# Patient Record
Sex: Female | Born: 1980 | Race: Black or African American | Hispanic: No | Marital: Married | Smoking: Never smoker
Health system: Southern US, Community
[De-identification: ages and names within clinical notes are randomized; demographics above are authoritative.]

## PROBLEM LIST (undated history)

## (undated) DIAGNOSIS — Z992 Dependence on renal dialysis: Secondary | ICD-10-CM

## (undated) DIAGNOSIS — G610 Guillain-Barre syndrome: Secondary | ICD-10-CM

## (undated) DIAGNOSIS — N186 End stage renal disease: Secondary | ICD-10-CM

## (undated) DIAGNOSIS — E119 Type 2 diabetes mellitus without complications: Secondary | ICD-10-CM

## (undated) DIAGNOSIS — N289 Disorder of kidney and ureter, unspecified: Secondary | ICD-10-CM

## (undated) DIAGNOSIS — I639 Cerebral infarction, unspecified: Secondary | ICD-10-CM

## (undated) DIAGNOSIS — F419 Anxiety disorder, unspecified: Secondary | ICD-10-CM

## (undated) DIAGNOSIS — D649 Anemia, unspecified: Secondary | ICD-10-CM

## (undated) HISTORY — PX: AMPUTATION TOE: SHX6595

## (undated) HISTORY — PX: AV FISTULA PLACEMENT: SHX1204

---

## 2006-08-13 DIAGNOSIS — H5440 Blindness, one eye, unspecified eye: Secondary | ICD-10-CM

## 2006-08-13 HISTORY — DX: Blindness, one eye, unspecified eye: H54.40

## 2010-03-13 DIAGNOSIS — Z765 Malingerer [conscious simulation]: Secondary | ICD-10-CM | POA: Insufficient documentation

## 2010-07-13 DIAGNOSIS — N049 Nephrotic syndrome with unspecified morphologic changes: Secondary | ICD-10-CM | POA: Insufficient documentation

## 2010-07-24 DIAGNOSIS — I120 Hypertensive chronic kidney disease with stage 5 chronic kidney disease or end stage renal disease: Secondary | ICD-10-CM | POA: Insufficient documentation

## 2011-07-30 DIAGNOSIS — T7491XA Unspecified adult maltreatment, confirmed, initial encounter: Secondary | ICD-10-CM | POA: Insufficient documentation

## 2014-08-11 DIAGNOSIS — R197 Diarrhea, unspecified: Secondary | ICD-10-CM | POA: Insufficient documentation

## 2014-08-11 DIAGNOSIS — Z713 Dietary counseling and surveillance: Secondary | ICD-10-CM | POA: Insufficient documentation

## 2014-08-11 DIAGNOSIS — R52 Pain, unspecified: Secondary | ICD-10-CM | POA: Insufficient documentation

## 2014-08-11 DIAGNOSIS — D689 Coagulation defect, unspecified: Secondary | ICD-10-CM | POA: Insufficient documentation

## 2014-08-11 DIAGNOSIS — L299 Pruritus, unspecified: Secondary | ICD-10-CM | POA: Insufficient documentation

## 2014-08-11 DIAGNOSIS — R0602 Shortness of breath: Secondary | ICD-10-CM | POA: Insufficient documentation

## 2014-08-11 DIAGNOSIS — D509 Iron deficiency anemia, unspecified: Secondary | ICD-10-CM | POA: Insufficient documentation

## 2014-08-11 DIAGNOSIS — E875 Hyperkalemia: Secondary | ICD-10-CM | POA: Insufficient documentation

## 2014-08-27 DIAGNOSIS — N186 End stage renal disease: Secondary | ICD-10-CM | POA: Insufficient documentation

## 2015-01-05 DIAGNOSIS — H269 Unspecified cataract: Secondary | ICD-10-CM | POA: Insufficient documentation

## 2015-01-05 DIAGNOSIS — E1010 Type 1 diabetes mellitus with ketoacidosis without coma: Secondary | ICD-10-CM | POA: Insufficient documentation

## 2015-01-05 DIAGNOSIS — Z89421 Acquired absence of other right toe(s): Secondary | ICD-10-CM | POA: Insufficient documentation

## 2015-04-12 DIAGNOSIS — Z2839 Other underimmunization status: Secondary | ICD-10-CM | POA: Insufficient documentation

## 2015-10-13 DIAGNOSIS — E46 Unspecified protein-calorie malnutrition: Secondary | ICD-10-CM | POA: Insufficient documentation

## 2016-08-23 DIAGNOSIS — R17 Unspecified jaundice: Secondary | ICD-10-CM | POA: Insufficient documentation

## 2017-04-26 DIAGNOSIS — Z4802 Encounter for removal of sutures: Secondary | ICD-10-CM | POA: Insufficient documentation

## 2017-07-24 ENCOUNTER — Other Ambulatory Visit: Payer: Self-pay

## 2017-07-24 ENCOUNTER — Encounter (HOSPITAL_COMMUNITY): Payer: Self-pay | Admitting: Emergency Medicine

## 2017-07-24 ENCOUNTER — Emergency Department (HOSPITAL_COMMUNITY)
Admission: EM | Admit: 2017-07-24 | Discharge: 2017-07-24 | Disposition: A | Discharge status: Home / Self Care | Payer: Medicaid Other | Attending: Emergency Medicine | Admitting: Emergency Medicine

## 2017-07-24 ENCOUNTER — Emergency Department (HOSPITAL_COMMUNITY): Payer: Medicaid Other

## 2017-07-24 DIAGNOSIS — N189 Chronic kidney disease, unspecified: Secondary | ICD-10-CM | POA: Diagnosis not present

## 2017-07-24 DIAGNOSIS — R0602 Shortness of breath: Secondary | ICD-10-CM | POA: Insufficient documentation

## 2017-07-24 DIAGNOSIS — R739 Hyperglycemia, unspecified: Secondary | ICD-10-CM | POA: Diagnosis not present

## 2017-07-24 DIAGNOSIS — E119 Type 2 diabetes mellitus without complications: Secondary | ICD-10-CM | POA: Insufficient documentation

## 2017-07-24 HISTORY — DX: Type 2 diabetes mellitus without complications: E11.9

## 2017-07-24 HISTORY — DX: Dependence on renal dialysis: Z99.2

## 2017-07-24 HISTORY — DX: Disorder of kidney and ureter, unspecified: N28.9

## 2017-07-24 LAB — COMPREHENSIVE METABOLIC PANEL
ALBUMIN: 3 g/dL — AB (ref 3.5–5.0)
ALK PHOS: 91 U/L (ref 38–126)
ALT: 10 U/L — ABNORMAL LOW (ref 14–54)
ANION GAP: 14 (ref 5–15)
AST: 12 U/L — ABNORMAL LOW (ref 15–41)
BUN: 72 mg/dL — ABNORMAL HIGH (ref 6–20)
CALCIUM: 8.7 mg/dL — AB (ref 8.9–10.3)
CO2: 18 mmol/L — AB (ref 22–32)
Chloride: 103 mmol/L (ref 101–111)
Creatinine, Ser: 12.04 mg/dL — ABNORMAL HIGH (ref 0.44–1.00)
GFR calc Af Amer: 4 mL/min — ABNORMAL LOW (ref >60)
GFR calc non Af Amer: 4 mL/min — ABNORMAL LOW (ref >60)
GLUCOSE: 321 mg/dL — AB (ref 65–99)
Potassium: 4.2 mmol/L (ref 3.5–5.1)
SODIUM: 135 mmol/L (ref 135–145)
Total Bilirubin: 0.6 mg/dL (ref 0.3–1.2)
Total Protein: 7.1 g/dL (ref 6.5–8.1)

## 2017-07-24 LAB — I-STAT CHEM 8, ED
BUN: 68 mg/dL — ABNORMAL HIGH (ref 6–20)
CALCIUM ION: 1.03 mmol/L — AB (ref 1.15–1.40)
Chloride: 105 mmol/L (ref 101–111)
Creatinine, Ser: 12.1 mg/dL — ABNORMAL HIGH (ref 0.44–1.00)
Glucose, Bld: 334 mg/dL — ABNORMAL HIGH (ref 65–99)
HCT: 38 % (ref 36.0–46.0)
HEMOGLOBIN: 12.9 g/dL (ref 12.0–15.0)
Potassium: 4.3 mmol/L (ref 3.5–5.1)
SODIUM: 138 mmol/L (ref 135–145)
TCO2: 21 mmol/L — AB (ref 22–32)

## 2017-07-24 LAB — CBC WITH DIFFERENTIAL/PLATELET
BASOS ABS: 0 10*3/uL (ref 0.0–0.1)
BASOS PCT: 0 %
EOS ABS: 0.5 10*3/uL (ref 0.0–0.7)
Eosinophils Relative: 7 %
HCT: 36.1 % (ref 36.0–46.0)
HEMOGLOBIN: 11.6 g/dL — AB (ref 12.0–15.0)
Lymphocytes Relative: 22 %
Lymphs Abs: 1.5 10*3/uL (ref 0.7–4.0)
MCH: 26.8 pg (ref 26.0–34.0)
MCHC: 32.1 g/dL (ref 30.0–36.0)
MCV: 83.4 fL (ref 78.0–100.0)
MONOS PCT: 4 %
Monocytes Absolute: 0.3 10*3/uL (ref 0.1–1.0)
NEUTROS PCT: 67 %
Neutro Abs: 4.5 10*3/uL (ref 1.7–7.7)
Platelets: 178 10*3/uL (ref 150–400)
RBC: 4.33 MIL/uL (ref 3.87–5.11)
RDW: 16.2 % — ABNORMAL HIGH (ref 11.5–15.5)
WBC: 6.8 10*3/uL (ref 4.0–10.5)

## 2017-07-24 NOTE — ED Provider Notes (Signed)
[place] EMERGENCY DEPARTMENT Provider Note   CSN: [id number] Arrival date & time: 07/24/17  6378     History   Chief Complaint Chief Complaint  Patient presents with  . Shortness of Breath  . Hyperglycemia    HPI [name] is a 36 y.o. female w PMHx T2DM, renal failure on HD TThSa, presenting to ED for dialysis.  Patient states she last went to dialysis on Thursday of last week, and was unable to get to dialysis since that time due to the snow.  States she was scheduled for dialysis yesterday and today, however did not have a ride.  Presents via EMS.  Patient also complains of shortness of breath since yesterday, worse with laying flat.  Denies increased lower extremity edema, chest pain, congestion, or any other complaints.  States her nephrologist is "Dr. Loletha Grayer" and goes to dialysis at [facility].   The history is provided by the patient.    Past Medical History:  Diagnosis Date  . Diabetes mellitus without complication ([place])   . Dialysis patient ([place])   . Renal disorder     There are no active problems to display for this patient.   History reviewed. No pertinent surgical history.  OB History    No data available       Home Medications    Prior to Admission medications   Not on File    Family History No family history on file.  Social History Social History   Tobacco Use  . Smoking status: Not on file  Substance Use Topics  . Alcohol use: Not on file  . Drug use: Not on file     Allergies   Patient has no allergy information on record.   Review of Systems Review of Systems  Constitutional: Negative for fever.  Respiratory: Positive for shortness of breath.   Cardiovascular: Negative for chest pain and leg swelling.  Gastrointestinal: Negative for abdominal pain.  All other systems reviewed and are negative.    Physical Exam Updated Vital Signs BP (!) 193/95   Pulse 92   Resp 19   Ht  5\' 1"  (1.549 m)   Wt 63.5 kg (140 lb)   LMP 07/16/2017   SpO2 96%   BMI 26.45 kg/m   Physical Exam  Constitutional: She is oriented to person, place, and time. She appears well-developed and well-nourished. No distress.  HENT:  Head: Normocephalic and atraumatic.  Mouth/Throat: Oropharynx is clear and moist.  Eyes: Conjunctivae are normal.  Neck: Normal range of motion. Neck supple.  Cardiovascular: Normal rate, regular rhythm and intact distal pulses.  Murmur heard. Pulmonary/Chest: Effort normal. No respiratory distress. She has no wheezes. She has no rhonchi. She has no rales.  Abdominal: Soft. Bowel sounds are normal. She exhibits no distension. There is no tenderness.  Musculoskeletal: Normal range of motion.       Right lower leg: She exhibits no tenderness and no edema.       Left lower leg: She exhibits no tenderness and no edema.  Neurological: She is alert and oriented to person, place, and time.  Skin: Skin is warm.  Psychiatric: She has a normal mood and affect. Her behavior is normal.  Nursing note and vitals reviewed.    ED Treatments / Results  Labs (all labs ordered are listed, but only abnormal results are displayed) Labs Reviewed  CBC WITH DIFFERENTIAL/PLATELET - Abnormal; Notable for the following components:      Result  Value   Hemoglobin 11.6 (*)    RDW 16.2 (*)    All other components within normal limits  COMPREHENSIVE METABOLIC PANEL - Abnormal; Notable for the following components:   CO2 18 (*)    Glucose, Bld 321 (*)    BUN 72 (*)    Creatinine, Ser 12.04 (*)    Calcium 8.7 (*)    Albumin 3.0 (*)    AST 12 (*)    ALT 10 (*)    GFR calc non Af Amer 4 (*)    GFR calc Af Amer 4 (*)    All other components within normal limits  I-STAT CHEM 8, ED - Abnormal; Notable for the following components:   BUN 68 (*)    Creatinine, Ser 12.10 (*)    Glucose, Bld 334 (*)    Calcium, Ion 1.03 (*)    TCO2 21 (*)    All other components within normal  limits  CBG MONITORING, ED    EKG  EKG Interpretation  Date/Time:  Wednesday July 24 2017 10:12:18 EST Ventricular Rate:  92 PR Interval:    QRS Duration: 80 QT Interval:  366 QTC Calculation: 453 R Axis:   36 Text Interpretation:  Sinus rhythm Probable left atrial enlargement Probable anteroseptal infarct, old No old tracing to compare No peaking of T waves Confirmed by Duffy Bruce [phone]) on 07/24/2017 11:00:36 AM       Radiology Dg Chest 2 View  Result Date: 07/24/2017 CLINICAL DATA:  Shortness of breath. EXAM: CHEST  2 VIEW COMPARISON:  None. FINDINGS: The heart size and mediastinal contours are within normal limits. Both lungs are clear. No pneumothorax or pleural effusion is noted. The visualized skeletal structures are unremarkable. IMPRESSION: No active cardiopulmonary disease. Electronically Signed   By: Marijo Conception, M.D.   On: 07/24/2017 10:53    Procedures Procedures (including critical care time)  Medications Ordered in ED Medications - No data to display   Initial Impression / Assessment and Plan / ED Course  I have reviewed the triage vital signs and the nursing notes.  Pertinent labs & imaging results that were available during my care of the patient were reviewed by me and considered in my medical decision making (see chart for details).  Clinical Course as of Jul 24 1252  Wed Jul 24, 2017  1128 Spoke with Dr. Marval Regal, will review patient's chart and see patient to evaluate for dispo.  [JR]  [place], midlevel with nephrology, recommending patient is safe for discharge to attend her dialysis appointment in the morning.  [JR]    Clinical Course User Index [JR] Jaylun Fleener, [place] N, PA-C    Presenting with shortness of breath since yesterday, and missed dialysis appointments x2.  Patient on hemodialysis Tuesday Thursday and Saturday, missed Saturday and Tuesdays appointment.  Patient also had it this morning, however reports she did  not have a ride, however is aware of [facility]. On exam, lungs CTAB, no inc work of breathing, no LE edema or evidence of fluid overload. K 4.2, CO2 18, BUN 72, Cr 12.04. CXR neg. Pt hypertensive. Discussed patient with Dr. Marval Regal, nephrology, who recommends they will see patient and determine if she is safe for discharge w outpatient HD in the morning as scheduled. [place] with nephrology recommending pt is safe for discharge.   Patient discussed with and seen by Dr. Ellender Hose.  Discussed results, findings, treatment and follow up. Patient advised of return precautions. Patient verbalized understanding and agreed with  plan.   Final Clinical Impressions(s) / ED Diagnoses   Final diagnoses:  Shortness of breath  Chronic renal failure, unspecified CKD stage    ED Discharge Orders    None       Allyna Pittsley, [place] N, PA-C 07/24/17 1253    Duffy Bruce, MD 07/24/17 2105

## 2017-07-24 NOTE — ED Triage Notes (Addendum)
Pt in from home via [facility] with c/o sob x 2 days. Pt goes T-Th-S, states her last session was Tuesday d/t snowy weather. Aox4, denies pain or n/v. BP 182/102, CBG 398, RA sats 99%, speaking full sentences

## 2017-07-24 NOTE — Discharge Instructions (Signed)
It is important that you attend your hemodialysis appointment tomorrow morning. You can call [facility] for transportation.  Return to the ER if your shortness of breath worsens before you get to dialysis, or for new or concerning symptoms.

## 2017-07-24 NOTE — ED Notes (Signed)
Patient transported to X-ray 

## 2017-09-14 ENCOUNTER — Emergency Department (HOSPITAL_COMMUNITY)
Admission: EM | Admit: 2017-09-14 | Discharge: 2017-09-14 | Disposition: A | Discharge status: Home / Self Care | Payer: Medicaid Other | Attending: Emergency Medicine | Admitting: Emergency Medicine

## 2017-09-14 ENCOUNTER — Other Ambulatory Visit: Payer: Self-pay

## 2017-09-14 ENCOUNTER — Encounter (HOSPITAL_COMMUNITY): Payer: Self-pay | Admitting: *Deleted

## 2017-09-14 DIAGNOSIS — R109 Unspecified abdominal pain: Secondary | ICD-10-CM | POA: Insufficient documentation

## 2017-09-14 DIAGNOSIS — E876 Hypokalemia: Secondary | ICD-10-CM | POA: Diagnosis not present

## 2017-09-14 DIAGNOSIS — E119 Type 2 diabetes mellitus without complications: Secondary | ICD-10-CM | POA: Insufficient documentation

## 2017-09-14 LAB — COMPREHENSIVE METABOLIC PANEL
ALBUMIN: 2 g/dL — AB (ref 3.5–5.0)
ALT: 7 U/L — AB (ref 14–54)
AST: 11 U/L — AB (ref 15–41)
Alkaline Phosphatase: 60 U/L (ref 38–126)
Anion gap: 12 (ref 5–15)
BILIRUBIN TOTAL: 0.2 mg/dL — AB (ref 0.3–1.2)
BUN: 6 mg/dL (ref 6–20)
CHLORIDE: 98 mmol/L — AB (ref 101–111)
CO2: 27 mmol/L (ref 22–32)
CREATININE: 3.37 mg/dL — AB (ref 0.44–1.00)
Calcium: 8.8 mg/dL — ABNORMAL LOW (ref 8.9–10.3)
GFR calc Af Amer: 19 mL/min — ABNORMAL LOW (ref >60)
GFR, EST NON AFRICAN AMERICAN: 17 mL/min — AB (ref >60)
GLUCOSE: 177 mg/dL — AB (ref 65–99)
Potassium: 2.9 mmol/L — ABNORMAL LOW (ref 3.5–5.1)
Sodium: 137 mmol/L (ref 135–145)
Total Protein: 8.2 g/dL — ABNORMAL HIGH (ref 6.5–8.1)

## 2017-09-14 LAB — I-STAT BETA HCG BLOOD, ED (MC, WL, AP ONLY): I-stat hCG, quantitative: <5 m[IU]/mL (ref <5)

## 2017-09-14 LAB — CBC
HEMATOCRIT: 25 % — AB (ref 36.0–46.0)
Hemoglobin: 7.6 g/dL — ABNORMAL LOW (ref 12.0–15.0)
MCH: 26 pg (ref 26.0–34.0)
MCHC: 30.4 g/dL (ref 30.0–36.0)
MCV: 85.6 fL (ref 78.0–100.0)
PLATELETS: 313 10*3/uL (ref 150–400)
RBC: 2.92 MIL/uL — ABNORMAL LOW (ref 3.87–5.11)
RDW: 15.2 % (ref 11.5–15.5)
WBC: 9.8 10*3/uL (ref 4.0–10.5)

## 2017-09-14 LAB — LIPASE, BLOOD: LIPASE: 25 U/L (ref 11–51)

## 2017-09-14 MED ORDER — DICYCLOMINE HCL 20 MG PO TABS: 20.0000 mg | ORAL_TABLET | Freq: Two times a day (BID) | ORAL | 0 refills | Status: DC

## 2017-09-14 MED ORDER — DICYCLOMINE HCL 10 MG PO CAPS
10.0000 mg | ORAL_CAPSULE | Freq: Once | ORAL | Status: AC
  Administered 2017-09-14: 10 mg via ORAL
  Filled 2017-09-14: qty 1

## 2017-09-14 NOTE — ED Provider Notes (Signed)
[place] EMERGENCY DEPARTMENT Provider Note   CSN: [id number] Arrival date & time: 09/14/17  1846     History   Chief Complaint No chief complaint on file.   HPI [name] is a 37 y.o. female.  37 year old female with history of ESRD on HD presents with complaint of abdominal cramping.  Patient reports that routinely after finishing a dialysis session she developed abdominal cramps and mild diarrhea.  This is her complaint today.  She finished her dialysis session earlier today and experienced mild abdominal cramps and some diarrhea.  She denies fever.  She denies nausea or vomiting.  She reports that her symptoms are significantly improved since arriving at the ED.  She declines IV.    The history is provided by the patient.  Abdominal Cramping  This is a chronic problem. The current episode started more than 1 week ago. The problem occurs every several days. The problem has not changed since onset.Associated symptoms include abdominal pain. Pertinent negatives include no chest pain. Exacerbated by: dialysis. Nothing relieves the symptoms. She has tried nothing for the symptoms. The treatment provided no relief.    Past Medical History:  Diagnosis Date  . Diabetes mellitus without complication ([place])   . Dialysis patient ([place])   . Renal disorder     There are no active problems to display for this patient.   History reviewed. No pertinent surgical history.  OB History    No data available       Home Medications    Prior to Admission medications   Medication Sig Start Date End Date Taking? Authorizing Provider  dicyclomine (BENTYL) 20 MG tablet Take 1 tablet (20 mg total) by mouth 2 (two) times daily. 09/14/17   Valarie Merino, MD    Family History No family history on file.  Social History Social History   Tobacco Use  . Smoking status: Never Smoker  . Smokeless tobacco: Never Used  Substance Use Topics  . Alcohol use: No   Frequency: Never  . Drug use: No     Allergies   Patient has no known allergies.   Review of Systems Review of Systems  Cardiovascular: Negative for chest pain.  Gastrointestinal: Positive for abdominal pain.  All other systems reviewed and are negative.    Physical Exam Updated Vital Signs BP (!) 146/74   Pulse 85   Temp 99.1 F (37.3 C) (Oral)   Resp 17   Ht 5\' 1"  (1.549 m)   Wt 62.6 kg (138 lb)   SpO2 100%   BMI 26.07 kg/m   Physical Exam  Constitutional: She is oriented to person, place, and time. She appears well-developed and well-nourished. No distress.  HENT:  Head: Normocephalic and atraumatic.  Mouth/Throat: Oropharynx is clear and moist.  Eyes: Conjunctivae and EOM are normal. Pupils are equal, round, and reactive to light.  Neck: Normal range of motion. Neck supple.  Cardiovascular: Normal rate, regular rhythm and normal heart sounds.  Pulmonary/Chest: Effort normal and breath sounds normal. No respiratory distress.  Abdominal: Soft. She exhibits no distension. There is no tenderness.  Musculoskeletal: Normal range of motion. She exhibits no edema or deformity.  Neurological: She is alert and oriented to person, place, and time.  Skin: Skin is warm and dry.  Psychiatric: She has a normal mood and affect.  Nursing note and vitals reviewed.    ED Treatments / Results  Labs (all labs ordered are listed, but only abnormal results are displayed) Labs  Reviewed  COMPREHENSIVE METABOLIC PANEL - Abnormal; Notable for the following components:      Result Value   Potassium 2.9 (*)    Chloride 98 (*)    Glucose, Bld 177 (*)    Creatinine, Ser 3.37 (*)    Calcium 8.8 (*)    Total Protein 8.2 (*)    Albumin 2.0 (*)    AST 11 (*)    ALT 7 (*)    Total Bilirubin 0.2 (*)    GFR calc non Af Amer 17 (*)    GFR calc Af Amer 19 (*)    All other components within normal limits  CBC - Abnormal; Notable for the following components:   RBC 2.92 (*)     Hemoglobin 7.6 (*)    HCT 25.0 (*)    All other components within normal limits  LIPASE, BLOOD  URINALYSIS, ROUTINE W REFLEX MICROSCOPIC  I-STAT BETA HCG BLOOD, ED ([facility], [facility], [facility] ONLY)    EKG  EKG Interpretation  Date/Time:  Saturday September 14 2017 19:23:15 EST Ventricular Rate:  84 PR Interval:  134 QRS Duration: 80 QT Interval:  408 QTC Calculation: 482 R Axis:   41 Text Interpretation:  Normal sinus rhythm Nonspecific ST and T wave abnormality Prolonged QT Abnormal ECG Confirmed by Dene Gentry [id number]) on 09/14/2017 8:30:17 PM       Radiology No results found.  Procedures Procedures (including critical care time)  Medications Ordered in ED Medications  dicyclomine (BENTYL) capsule 10 mg (10 mg Oral Given 09/14/17 2052)     Initial Impression / Assessment and Plan / ED Course  I have reviewed the triage vital signs and the nursing notes.  Pertinent labs & imaging results that were available during my care of the patient were reviewed by me and considered in my medical decision making (see chart for details).     MDM  Screen Complete  Patient is presenting with diffuse abdominal cramping and some mild diarrhea following dialysis session.  This appears to be a chronic problem for her.  She denies any acuity to her complaint.  Baseline screening labs reveal mild hypokalemia.  She reports a long-standing history of hyperkalemia in the past.  She is not due for dialysis for another 72 hours and I will therefore not treat her mild hypokalemia at this time.  She feels improved following her ED evaluation and treatment.  Strict return precautions given and understood.  Close follow-up is advised.   Final Clinical Impressions(s) / ED Diagnoses   Final diagnoses:  Abdominal cramping  Hypokalemia    ED Discharge Orders        Ordered    dicyclomine (BENTYL) 20 MG tablet  2 times daily     09/14/17 2242       Valarie Merino, MD 09/14/17 2326

## 2017-09-14 NOTE — ED Notes (Signed)
Pt will advise staff when able to go to restroom.

## 2017-09-14 NOTE — ED Notes (Signed)
Pt unable to void at this time. 

## 2017-09-14 NOTE — ED Triage Notes (Signed)
Pt in from home via [facility], per report pt had full HD tx today, pt called out d/t epigastric pain, pt A&O x4, pt reported to have diarrhea x 1 that is typical for post HD per pt, denies n/v, pt c/o dizziness

## 2017-10-01 DIAGNOSIS — Z992 Dependence on renal dialysis: Secondary | ICD-10-CM | POA: Insufficient documentation

## 2018-05-28 ENCOUNTER — Encounter (HOSPITAL_COMMUNITY): Payer: Self-pay | Admitting: *Deleted

## 2018-05-28 ENCOUNTER — Emergency Department (HOSPITAL_COMMUNITY)
Admission: EM | Admit: 2018-05-28 | Discharge: 2018-05-28 | Disposition: A | Discharge status: Home / Self Care | Payer: Medicaid Other | Attending: Emergency Medicine | Admitting: Emergency Medicine

## 2018-05-28 ENCOUNTER — Other Ambulatory Visit: Payer: Self-pay

## 2018-05-28 DIAGNOSIS — N938 Other specified abnormal uterine and vaginal bleeding: Secondary | ICD-10-CM

## 2018-05-28 DIAGNOSIS — D649 Anemia, unspecified: Secondary | ICD-10-CM

## 2018-05-28 DIAGNOSIS — E119 Type 2 diabetes mellitus without complications: Secondary | ICD-10-CM | POA: Insufficient documentation

## 2018-05-28 LAB — I-STAT BETA HCG BLOOD, ED (MC, WL, AP ONLY)

## 2018-05-28 LAB — COMPREHENSIVE METABOLIC PANEL
ALT: 10 U/L (ref 0–44)
AST: 13 U/L — AB (ref 15–41)
Albumin: 3.1 g/dL — ABNORMAL LOW (ref 3.5–5.0)
Alkaline Phosphatase: 60 U/L (ref 38–126)
Anion gap: 10 (ref 5–15)
BUN: 36 mg/dL — ABNORMAL HIGH (ref 6–20)
CHLORIDE: 99 mmol/L (ref 98–111)
CO2: 28 mmol/L (ref 22–32)
Calcium: 8.9 mg/dL (ref 8.9–10.3)
Creatinine, Ser: 8.46 mg/dL — ABNORMAL HIGH (ref 0.44–1.00)
GFR calc Af Amer: 6 mL/min — ABNORMAL LOW (ref >60)
GFR, EST NON AFRICAN AMERICAN: 5 mL/min — AB (ref >60)
Glucose, Bld: 225 mg/dL — ABNORMAL HIGH (ref 70–99)
POTASSIUM: 4.2 mmol/L (ref 3.5–5.1)
SODIUM: 137 mmol/L (ref 135–145)
Total Bilirubin: 0.6 mg/dL (ref 0.3–1.2)
Total Protein: 6.6 g/dL (ref 6.5–8.1)

## 2018-05-28 LAB — CBC WITH DIFFERENTIAL/PLATELET
Abs Immature Granulocytes: 0.01 10*3/uL (ref 0.00–0.07)
BASOS PCT: 1 %
Basophils Absolute: 0 10*3/uL (ref 0.0–0.1)
EOS ABS: 0.3 10*3/uL (ref 0.0–0.5)
Eosinophils Relative: 7 %
HCT: 26.7 % — ABNORMAL LOW (ref 36.0–46.0)
Hemoglobin: 8.2 g/dL — ABNORMAL LOW (ref 12.0–15.0)
IMMATURE GRANULOCYTES: 0 %
Lymphocytes Relative: 37 %
Lymphs Abs: 1.6 10*3/uL (ref 0.7–4.0)
MCH: 27.8 pg (ref 26.0–34.0)
MCHC: 30.7 g/dL (ref 30.0–36.0)
MCV: 90.5 fL (ref 80.0–100.0)
MONOS PCT: 6 %
Monocytes Absolute: 0.3 10*3/uL (ref 0.1–1.0)
NEUTROS PCT: 49 %
NRBC: 0 % (ref 0.0–0.2)
Neutro Abs: 2.1 10*3/uL (ref 1.7–7.7)
PLATELETS: 126 10*3/uL — AB (ref 150–400)
RBC: 2.95 MIL/uL — AB (ref 3.87–5.11)
RDW: 12.3 % (ref 11.5–15.5)
WBC: 4.3 10*3/uL (ref 4.0–10.5)

## 2018-05-28 NOTE — ED Triage Notes (Signed)
Pt in c/o abnormal vaginal bleeding, states she had her normal menstrual cycle at the beginning of the month which ended, then restarted bleeding this Monday, denies pain, no distress noted

## 2018-05-28 NOTE — ED Notes (Signed)
Patient states she was on her menstrual period 10/1-10/5 then started vaginal bleeding 10/15 used 2pads per day and states she is passing clots.

## 2018-05-28 NOTE — ED Provider Notes (Signed)
[place] EMERGENCY DEPARTMENT Provider Note   CSN: [id number] Arrival date & time: 05/28/18  6578     History   Chief Complaint Chief Complaint  Patient presents with  . Vaginal Bleeding    HPI [name] is a 37 y.o. female.  HPI Patient states she normally has her.  The first of the month.  Normally last roughly 1 week.  Last normal menstrual cycle ended 10/5.  She began having vaginal bleeding over the last 2 days.  States she is using 2 pads a day.  Is having minimal lower abdominal cramping.  Denies vaginal discharge. Past Medical History:  Diagnosis Date  . Diabetes mellitus without complication ([place])   . Dialysis patient ([place])   . Renal disorder     There are no active problems to display for this patient.   History reviewed. No pertinent surgical history.   OB History   None      Home Medications    Prior to Admission medications   Medication Sig Start Date End Date Taking? Authorizing Provider  dicyclomine (BENTYL) 20 MG tablet Take 1 tablet (20 mg total) by mouth 2 (two) times daily. Patient not taking: Reported on 05/28/2018 09/14/17   Valarie Merino, MD    Family History History reviewed. No pertinent family history.  Social History Social History   Tobacco Use  . Smoking status: Never Smoker  . Smokeless tobacco: Never Used  Substance Use Topics  . Alcohol use: No    Frequency: Never  . Drug use: No     Allergies   Chocolate   Review of Systems Review of Systems  Constitutional: Negative for chills and fever.  Respiratory: Negative for cough and shortness of breath.   Cardiovascular: Negative for chest pain.  Gastrointestinal: Positive for abdominal pain. Negative for constipation, diarrhea and nausea.  Genitourinary: Positive for pelvic pain and vaginal bleeding. Negative for flank pain, frequency and vaginal discharge.  Musculoskeletal: Negative for back pain, myalgias and neck pain.  Skin: Negative for  rash and wound.  Neurological: Negative for dizziness, weakness, light-headedness, numbness and headaches.  All other systems reviewed and are negative.    Physical Exam Updated Vital Signs BP (!) 148/85   Pulse 92   Temp 98.4 F (36.9 C) (Oral)   Resp 20   LMP 05/13/2018   SpO2 100%   Physical Exam  Constitutional: She is oriented to person, place, and time. She appears well-developed and well-nourished.  HENT:  Head: Normocephalic and atraumatic.  Mouth/Throat: Oropharynx is clear and moist. No oropharyngeal exudate.  Eyes: Pupils are equal, round, and reactive to light. EOM are normal.  Neck: Normal range of motion. Neck supple.  Cardiovascular: Normal rate and regular rhythm.  Pulmonary/Chest: Effort normal and breath sounds normal.  Abdominal: Soft. Bowel sounds are normal. There is tenderness. There is no rebound and no guarding.  Very mild suprapubic tenderness with palpation.  No rebound or guarding.  Musculoskeletal: Normal range of motion. She exhibits no edema or tenderness.  No CVA tenderness bilaterally.  Neurological: She is alert and oriented to person, place, and time.  Skin: Skin is warm and dry. No rash noted. No erythema.  Psychiatric: She has a normal mood and affect. Her behavior is normal.  Nursing note and vitals reviewed.    ED Treatments / Results  Labs (all labs ordered are listed, but only abnormal results are displayed) Labs Reviewed  CBC WITH DIFFERENTIAL/PLATELET - Abnormal; Notable for the following components:  Result Value   RBC 2.95 (*)    Hemoglobin 8.2 (*)    HCT 26.7 (*)    Platelets 126 (*)    All other components within normal limits  COMPREHENSIVE METABOLIC PANEL - Abnormal; Notable for the following components:   Glucose, Bld 225 (*)    BUN 36 (*)    Creatinine, Ser 8.46 (*)    Albumin 3.1 (*)    AST 13 (*)    GFR calc non Af Amer 5 (*)    GFR calc Af Amer 6 (*)    All other components within normal limits  I-STAT  BETA HCG BLOOD, ED ([facility], [facility], [facility] ONLY)    EKG None  Radiology No results found.  Procedures Procedures (including critical care time)  Medications Ordered in ED Medications - No data to display   Initial Impression / Assessment and Plan / ED Course  I have reviewed the triage vital signs and the nursing notes.  Pertinent labs & imaging results that were available during my care of the patient were reviewed by me and considered in my medical decision making (see chart for details).     Patient is well-appearing and in no distress.  Complains of irregular vaginal bleeding. Hemoglobin is stable compared to earlier in the year.  Vital signs remained stable.  Patient advised to follow-up closely with OB/GYN.  Strict return precautions given. Final Clinical Impressions(s) / ED Diagnoses   Final diagnoses:  Dysfunctional uterine bleeding  Anemia, unspecified type    ED Discharge Orders    None       Julianne Rice, MD 05/28/18 1234

## 2018-06-09 ENCOUNTER — Ambulatory Visit: Payer: Medicaid Other | Admitting: Podiatry

## 2018-06-27 ENCOUNTER — Ambulatory Visit: Payer: Medicaid Other | Admitting: Podiatry

## 2018-07-05 DIAGNOSIS — E8770 Fluid overload, unspecified: Secondary | ICD-10-CM | POA: Insufficient documentation

## 2019-02-04 ENCOUNTER — Encounter: Payer: Medicaid Other | Admitting: Family

## 2019-02-10 ENCOUNTER — Telehealth (HOSPITAL_COMMUNITY): Payer: Self-pay | Admitting: Rehabilitation

## 2019-02-10 NOTE — Telephone Encounter (Signed)

## 2019-02-11 ENCOUNTER — Encounter: Payer: Self-pay | Admitting: *Deleted

## 2019-02-11 ENCOUNTER — Other Ambulatory Visit: Payer: Self-pay

## 2019-02-11 ENCOUNTER — Encounter: Payer: Self-pay | Admitting: Vascular Surgery

## 2019-02-11 ENCOUNTER — Ambulatory Visit (INDEPENDENT_AMBULATORY_CARE_PROVIDER_SITE_OTHER): Payer: Medicaid Other | Admitting: Vascular Surgery

## 2019-02-11 VITALS — BP 129/79 | HR 85 | Resp 14 | Ht 61.0 in | Wt 144.0 lb

## 2019-02-11 DIAGNOSIS — Z992 Dependence on renal dialysis: Secondary | ICD-10-CM | POA: Diagnosis not present

## 2019-02-11 DIAGNOSIS — D649 Anemia, unspecified: Secondary | ICD-10-CM | POA: Insufficient documentation

## 2019-02-11 DIAGNOSIS — H3500 Unspecified background retinopathy: Secondary | ICD-10-CM | POA: Insufficient documentation

## 2019-02-11 DIAGNOSIS — E785 Hyperlipidemia, unspecified: Secondary | ICD-10-CM | POA: Insufficient documentation

## 2019-02-11 DIAGNOSIS — N186 End stage renal disease: Secondary | ICD-10-CM

## 2019-02-11 DIAGNOSIS — F4310 Post-traumatic stress disorder, unspecified: Secondary | ICD-10-CM | POA: Insufficient documentation

## 2019-02-11 DIAGNOSIS — D638 Anemia in other chronic diseases classified elsewhere: Secondary | ICD-10-CM | POA: Insufficient documentation

## 2019-02-11 DIAGNOSIS — E119 Type 2 diabetes mellitus without complications: Secondary | ICD-10-CM | POA: Insufficient documentation

## 2019-02-11 DIAGNOSIS — I739 Peripheral vascular disease, unspecified: Secondary | ICD-10-CM | POA: Insufficient documentation

## 2019-02-11 DIAGNOSIS — N2581 Secondary hyperparathyroidism of renal origin: Secondary | ICD-10-CM | POA: Insufficient documentation

## 2019-02-11 NOTE — Progress Notes (Signed)
Patient name: [name] MRN: [id number] DOB: January 14, 1981 Sex: female  REASON FOR VISIT:   Enlarging aneurysms.  The consult is requested by Dr. Marval Regal.  HPI:   [name] is a pleasant 38 y.o. 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  Of note she is a poor historian is not sure how long she has had the fistula or when she began dialysis.  She dialyzes on Tuesdays Thursdays and Saturdays.  Importantly, she tells me that when she is on dialysis she gets some paresthesias in her right hand.  This goes away when she discontinues dialysis.  She denies any other uremic symptoms.  Specifically, she denies nausea, vomiting, fatigue, anorexia, or palpitations.  Past Medical History:  Diagnosis Date  . Diabetes mellitus without complication ([place])   . Dialysis patient ([place])   . Renal disorder     Family History  Problem Relation Age of Onset  . Diabetes Mother   . Diabetes Father     SOCIAL HISTORY: Social History   Tobacco Use  . Smoking status: Never Smoker  . Smokeless tobacco: Never Used  Substance Use Topics  . Alcohol use: No    Frequency: Never    Allergies  Allergen Reactions  . Chocolate Hives  . Morphine Rash    Current Outpatient Medications  Medication Sig Dispense Refill  . calcitRIOL (ROCALTROL) 0.25 MCG capsule Take by mouth.    . calcium acetate (PHOSLO) 667 MG capsule Take by mouth.    . calcium acetate (PHOSLO) 667 MG capsule TAKE 2 CAPSULES BY MOUTH THREE TIMES DAILY WITH MEALS    . dicyclomine (BENTYL) 20 MG tablet Take 1 tablet (20 mg total) by mouth 2 (two) times daily. 20 tablet 0  . FOSRENOL 1000 MG PACK MIX 1 PACKET WITH SMALL AMOUNT OF APPLESAUCE OR SIMILAR FOOD. EAT IMMEDIATELY 3 TIMES/DAY WITH MEALS & 1 PACKET WITH SNACKS TWICE A DAY    . insulin aspart (NOVOLOG) 100 UNIT/ML injection Inject into the skin.    Marland Kitchen insulin glargine (LANTUS) 100 UNIT/ML injection Inject into the skin.    . multivitamin  (RENA-VIT) TABS tablet TAKE 1 TABLET BY MOUTH EVERY MORNING   THIS IS YOUR RENAL VITAMIN     No current facility-administered medications for this visit.     REVIEW OF SYSTEMS:  [X]  denotes positive finding, [ ]  denotes negative finding Cardiac  Comments:  Chest pain or chest pressure:    Shortness of breath upon exertion:    Short of breath when lying flat:    Irregular heart rhythm:        Vascular    Pain in calf, thigh, or hip brought on by ambulation:    Pain in feet at night that wakes you up from your sleep:     Blood clot in your veins:    Leg swelling:         Pulmonary    Oxygen at home:    Productive cough:     Wheezing:         Neurologic    Sudden weakness in arms or legs:  x   Sudden numbness in arms or legs:     Sudden onset of difficulty speaking or slurred speech:    Temporary loss of vision in one eye:  x   Problems with dizziness:         Gastrointestinal    Blood in stool:     Vomited blood:  Genitourinary    Burning when urinating:     Blood in urine:        Psychiatric    Major depression:         Hematologic    Bleeding problems:    Problems with blood clotting too easily:        Skin    Rashes or ulcers:        Constitutional    Fever or chills:     PHYSICAL EXAM:   Vitals:   02/11/19 0813  BP: 129/79  Pulse: 85  Resp: 14  SpO2: 99%  Weight: 144 lb (65.3 kg)  Height: 5\' 1"  (1.549 m)    GENERAL: The patient is a well-nourished female, in no acute distress. The vital signs are documented above. CARDIAC: There is a regular rate and rhythm.  VASCULAR: She has a palpable thrill in her right upper arm brachiocephalic fistula. I cannot palpate a radial pulse. She has a monophasic but brisk radial signal with the Doppler.  I cannot obtain an ulnar signal. 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    PULMONARY: There is good air exchange bilaterally without  wheezing or rales. MUSCULOSKELETAL: There are no major deformities or cyanosis. NEUROLOGIC: No focal weakness or paresthesias are detected. SKIN: There are no ulcers or rashes noted. PSYCHIATRIC: The patient has a normal affect.  DATA:    No data  MEDICAL ISSUES:   ANEURYSM RIGHT BRACHIOCEPHALIC AV FISTULA: Given the aneurysm of the right brachiocephalic fistula I have recommended that we plicate this.  I have explained that I think if we do this they will still have room to get 2 needles either central to this or one above and below the revision.  In addition she does have some steal symptoms when she is on dialysis and by narrowing this area some I think that could potentially help with her symptoms.  This would essentially function as banding also.  She dialyzes on Tuesdays Thursdays and Saturdays and we have arranged this for Monday, July 20.  I have reviewed the procedure and potential complications and she is agreeable to proceed.  Deitra Mayo [facility] [phone]

## 2019-02-20 ENCOUNTER — Other Ambulatory Visit: Payer: Self-pay

## 2019-02-21 ENCOUNTER — Other Ambulatory Visit: Payer: Self-pay

## 2019-02-21 ENCOUNTER — Emergency Department (HOSPITAL_COMMUNITY): Payer: Medicaid Other

## 2019-02-21 ENCOUNTER — Observation Stay (HOSPITAL_COMMUNITY)
Admission: EM | Admit: 2019-02-21 | Discharge: 2019-02-23 | Disposition: A | Discharge status: Home / Self Care | Payer: Medicaid Other | Attending: Internal Medicine | Admitting: Internal Medicine

## 2019-02-21 ENCOUNTER — Encounter (HOSPITAL_COMMUNITY): Payer: Self-pay

## 2019-02-21 DIAGNOSIS — I120 Hypertensive chronic kidney disease with stage 5 chronic kidney disease or end stage renal disease: Secondary | ICD-10-CM | POA: Diagnosis not present

## 2019-02-21 DIAGNOSIS — Z1159 Encounter for screening for other viral diseases: Secondary | ICD-10-CM | POA: Insufficient documentation

## 2019-02-21 DIAGNOSIS — Z89421 Acquired absence of other right toe(s): Secondary | ICD-10-CM

## 2019-02-21 DIAGNOSIS — Z91018 Allergy to other foods: Secondary | ICD-10-CM

## 2019-02-21 DIAGNOSIS — F4310 Post-traumatic stress disorder, unspecified: Secondary | ICD-10-CM | POA: Diagnosis not present

## 2019-02-21 DIAGNOSIS — R739 Hyperglycemia, unspecified: Secondary | ICD-10-CM

## 2019-02-21 DIAGNOSIS — Z885 Allergy status to narcotic agent status: Secondary | ICD-10-CM | POA: Insufficient documentation

## 2019-02-21 DIAGNOSIS — R509 Fever, unspecified: Secondary | ICD-10-CM | POA: Diagnosis not present

## 2019-02-21 DIAGNOSIS — E785 Hyperlipidemia, unspecified: Secondary | ICD-10-CM | POA: Diagnosis not present

## 2019-02-21 DIAGNOSIS — D631 Anemia in chronic kidney disease: Secondary | ICD-10-CM | POA: Insufficient documentation

## 2019-02-21 DIAGNOSIS — E1165 Type 2 diabetes mellitus with hyperglycemia: Secondary | ICD-10-CM | POA: Insufficient documentation

## 2019-02-21 DIAGNOSIS — E11319 Type 2 diabetes mellitus with unspecified diabetic retinopathy without macular edema: Secondary | ICD-10-CM | POA: Insufficient documentation

## 2019-02-21 DIAGNOSIS — N186 End stage renal disease: Secondary | ICD-10-CM | POA: Diagnosis not present

## 2019-02-21 DIAGNOSIS — N2581 Secondary hyperparathyroidism of renal origin: Secondary | ICD-10-CM | POA: Insufficient documentation

## 2019-02-21 DIAGNOSIS — Z833 Family history of diabetes mellitus: Secondary | ICD-10-CM | POA: Diagnosis not present

## 2019-02-21 DIAGNOSIS — Z992 Dependence on renal dialysis: Secondary | ICD-10-CM | POA: Diagnosis not present

## 2019-02-21 DIAGNOSIS — E1122 Type 2 diabetes mellitus with diabetic chronic kidney disease: Secondary | ICD-10-CM | POA: Insufficient documentation

## 2019-02-21 DIAGNOSIS — E1151 Type 2 diabetes mellitus with diabetic peripheral angiopathy without gangrene: Secondary | ICD-10-CM | POA: Insufficient documentation

## 2019-02-21 DIAGNOSIS — Z91010 Allergy to peanuts: Secondary | ICD-10-CM

## 2019-02-21 DIAGNOSIS — Z79899 Other long term (current) drug therapy: Secondary | ICD-10-CM | POA: Insufficient documentation

## 2019-02-21 DIAGNOSIS — M791 Myalgia, unspecified site: Secondary | ICD-10-CM | POA: Insufficient documentation

## 2019-02-21 DIAGNOSIS — I272 Pulmonary hypertension, unspecified: Secondary | ICD-10-CM

## 2019-02-21 DIAGNOSIS — Z794 Long term (current) use of insulin: Secondary | ICD-10-CM | POA: Insufficient documentation

## 2019-02-21 DIAGNOSIS — Z20822 Contact with and (suspected) exposure to covid-19: Secondary | ICD-10-CM | POA: Diagnosis present

## 2019-02-21 LAB — COMPREHENSIVE METABOLIC PANEL
ALT: 14 U/L (ref 0–44)
AST: 17 U/L (ref 15–41)
Albumin: 2.8 g/dL — ABNORMAL LOW (ref 3.5–5.0)
Alkaline Phosphatase: 113 U/L (ref 38–126)
Anion gap: 18 — ABNORMAL HIGH (ref 5–15)
BUN: 54 mg/dL — ABNORMAL HIGH (ref 6–20)
CO2: 23 mmol/L (ref 22–32)
Calcium: 8.9 mg/dL (ref 8.9–10.3)
Chloride: 87 mmol/L — ABNORMAL LOW (ref 98–111)
Creatinine, Ser: 11.5 mg/dL — ABNORMAL HIGH (ref 0.44–1.00)
GFR calc Af Amer: 4 mL/min — ABNORMAL LOW (ref >60)
GFR calc non Af Amer: 4 mL/min — ABNORMAL LOW (ref >60)
Glucose, Bld: 675 mg/dL (ref 70–99)
Potassium: 4.6 mmol/L (ref 3.5–5.1)
Sodium: 128 mmol/L — ABNORMAL LOW (ref 135–145)
Total Bilirubin: 0.6 mg/dL (ref 0.3–1.2)
Total Protein: 6.9 g/dL (ref 6.5–8.1)

## 2019-02-21 LAB — BASIC METABOLIC PANEL
Anion gap: 16 — ABNORMAL HIGH (ref 5–15)
BUN: 56 mg/dL — ABNORMAL HIGH (ref 6–20)
CO2: 25 mmol/L (ref 22–32)
Calcium: 9.6 mg/dL (ref 8.9–10.3)
Chloride: 94 mmol/L — ABNORMAL LOW (ref 98–111)
Creatinine, Ser: 11.8 mg/dL — ABNORMAL HIGH (ref 0.44–1.00)
GFR calc Af Amer: 4 mL/min — ABNORMAL LOW (ref >60)
GFR calc non Af Amer: 4 mL/min — ABNORMAL LOW (ref >60)
Glucose, Bld: 109 mg/dL — ABNORMAL HIGH (ref 70–99)
Potassium: 4.2 mmol/L (ref 3.5–5.1)
Sodium: 135 mmol/L (ref 135–145)

## 2019-02-21 LAB — CBC WITH DIFFERENTIAL/PLATELET
Abs Immature Granulocytes: 0.1 10*3/uL — ABNORMAL HIGH (ref 0.00–0.07)
Basophils Absolute: 0 10*3/uL (ref 0.0–0.1)
Basophils Relative: 0 %
Eosinophils Absolute: 0.2 10*3/uL (ref 0.0–0.5)
Eosinophils Relative: 1 %
HCT: 34.3 % — ABNORMAL LOW (ref 36.0–46.0)
Hemoglobin: 11 g/dL — ABNORMAL LOW (ref 12.0–15.0)
Immature Granulocytes: 1 %
Lymphocytes Relative: 9 %
Lymphs Abs: 1.2 10*3/uL (ref 0.7–4.0)
MCH: 28.6 pg (ref 26.0–34.0)
MCHC: 32.1 g/dL (ref 30.0–36.0)
MCV: 89.3 fL (ref 80.0–100.0)
Monocytes Absolute: 1.1 10*3/uL — ABNORMAL HIGH (ref 0.1–1.0)
Monocytes Relative: 9 %
Neutro Abs: 10.7 10*3/uL — ABNORMAL HIGH (ref 1.7–7.7)
Neutrophils Relative %: 80 %
Platelets: 152 10*3/uL (ref 150–400)
RBC: 3.84 MIL/uL — ABNORMAL LOW (ref 3.87–5.11)
RDW: 13.1 % (ref 11.5–15.5)
WBC: 13.3 10*3/uL — ABNORMAL HIGH (ref 4.0–10.5)
nRBC: 0 % (ref 0.0–0.2)

## 2019-02-21 LAB — CBG MONITORING, ED
Glucose-Capillary: >600 mg/dL (ref 70–99)
Glucose-Capillary: 559 mg/dL (ref 70–99)
Glucose-Capillary: 431 mg/dL — ABNORMAL HIGH (ref 70–99)
Glucose-Capillary: 266 mg/dL — ABNORMAL HIGH (ref 70–99)
Glucose-Capillary: 83 mg/dL (ref 70–99)
Glucose-Capillary: 88 mg/dL (ref 70–99)
Glucose-Capillary: 87 mg/dL (ref 70–99)
Glucose-Capillary: 122 mg/dL — ABNORMAL HIGH (ref 70–99)

## 2019-02-21 LAB — RESPIRATORY PANEL BY PCR

## 2019-02-21 LAB — GLUCOSE, CAPILLARY
Glucose-Capillary: 154 mg/dL — ABNORMAL HIGH (ref 70–99)
Glucose-Capillary: 163 mg/dL — ABNORMAL HIGH (ref 70–99)

## 2019-02-21 LAB — SARS CORONAVIRUS 2 BY RT PCR (HOSPITAL ORDER, PERFORMED IN ~~LOC~~ HOSPITAL LAB)
SARS Coronavirus 2: NEGATIVE
SARS Coronavirus 2: NEGATIVE

## 2019-02-21 LAB — CBC
HCT: 37.2 % (ref 36.0–46.0)
Hemoglobin: 12 g/dL (ref 12.0–15.0)
MCH: 28.3 pg (ref 26.0–34.0)
MCHC: 32.3 g/dL (ref 30.0–36.0)
MCV: 87.7 fL (ref 80.0–100.0)
Platelets: 184 10*3/uL (ref 150–400)
RBC: 4.24 MIL/uL (ref 3.87–5.11)
RDW: 13 % (ref 11.5–15.5)
WBC: 16.3 10*3/uL — ABNORMAL HIGH (ref 4.0–10.5)
nRBC: 0 % (ref 0.0–0.2)

## 2019-02-21 LAB — URINALYSIS, ROUTINE W REFLEX MICROSCOPIC
Bacteria, UA: NONE SEEN
Bilirubin Urine: NEGATIVE
Glucose, UA: >=500 mg/dL — AB
Ketones, ur: NEGATIVE mg/dL
Nitrite: NEGATIVE
Protein, ur: 100 mg/dL — AB
RBC / HPF: >50 RBC/hpf — ABNORMAL HIGH (ref 0–5)
Specific Gravity, Urine: 1.012 (ref 1.005–1.030)
WBC, UA: >50 WBC/hpf — ABNORMAL HIGH (ref 0–5)
pH: 7 (ref 5.0–8.0)

## 2019-02-21 LAB — D-DIMER, QUANTITATIVE: D-Dimer, Quant: 1.29 ug/mL-FEU — ABNORMAL HIGH (ref 0.00–0.50)

## 2019-02-21 LAB — C-REACTIVE PROTEIN: CRP: 18.8 mg/dL — ABNORMAL HIGH (ref <1.0)

## 2019-02-21 LAB — LACTIC ACID, PLASMA: Lactic Acid, Venous: 1.8 mmol/L (ref 0.5–1.9)

## 2019-02-21 LAB — LACTATE DEHYDROGENASE: LDH: 240 U/L — ABNORMAL HIGH (ref 98–192)

## 2019-02-21 LAB — I-STAT BETA HCG BLOOD, ED (MC, WL, AP ONLY): I-stat hCG, quantitative: 16.8 m[IU]/mL — ABNORMAL HIGH (ref <5)

## 2019-02-21 LAB — OSMOLALITY: Osmolality: 296 mOsm/kg — ABNORMAL HIGH (ref 275–295)

## 2019-02-21 LAB — FERRITIN: Ferritin: 1203 ng/mL — ABNORMAL HIGH (ref 11–307)

## 2019-02-21 LAB — FIBRINOGEN: Fibrinogen: 656 mg/dL — ABNORMAL HIGH (ref 210–475)

## 2019-02-21 LAB — TRIGLYCERIDES: Triglycerides: 134 mg/dL (ref <150)

## 2019-02-21 LAB — MRSA PCR SCREENING: MRSA by PCR: NEGATIVE

## 2019-02-21 LAB — BETA-HYDROXYBUTYRIC ACID: Beta-Hydroxybutyric Acid: 0.17 mmol/L (ref 0.05–0.27)

## 2019-02-21 LAB — PROCALCITONIN: Procalcitonin: 3.05 ng/mL

## 2019-02-21 MED ORDER — SODIUM CHLORIDE 0.9% FLUSH
3.0000 mL | Freq: Two times a day (BID) | INTRAVENOUS | Status: DC
  Administered 2019-02-22 – 2019-02-23 (×3): 3 mL via INTRAVENOUS

## 2019-02-21 MED ORDER — INSULIN ASPART 100 UNIT/ML ~~LOC~~ SOLN
0.0000 [IU] | Freq: Three times a day (TID) | SUBCUTANEOUS | Status: DC
  Administered 2019-02-21 – 2019-02-22 (×3): 2 [IU] via SUBCUTANEOUS
  Administered 2019-02-23: 5 [IU] via SUBCUTANEOUS
  Administered 2019-02-23: 3 [IU] via SUBCUTANEOUS

## 2019-02-21 MED ORDER — INSULIN GLARGINE 100 UNIT/ML ~~LOC~~ SOLN
30.0000 [IU] | SUBCUTANEOUS | Status: AC
  Administered 2019-02-21: 30 [IU] via SUBCUTANEOUS
  Filled 2019-02-21: qty 0.3

## 2019-02-21 MED ORDER — RENA-VITE PO TABS
1.0000 | ORAL_TABLET | Freq: Every day | ORAL | Status: DC
  Administered 2019-02-21 – 2019-02-23 (×3): 1 via ORAL
  Filled 2019-02-21 (×3): qty 1

## 2019-02-21 MED ORDER — DEXTROSE 5 % IV SOLN
INTRAVENOUS | Status: DC
  Administered 2019-02-21: 16:00:00 via INTRAVENOUS

## 2019-02-21 MED ORDER — CHLORHEXIDINE GLUCONATE CLOTH 2 % EX PADS
6.0000 | MEDICATED_PAD | Freq: Every day | CUTANEOUS | Status: DC
  Administered 2019-02-22 – 2019-02-23 (×2): 6 via TOPICAL

## 2019-02-21 MED ORDER — SODIUM CHLORIDE 0.9 % FOR CRRT: INTRAVENOUS_CENTRAL | Status: DC | PRN

## 2019-02-21 MED ORDER — INSULIN REGULAR(HUMAN) IN NACL 100-0.9 UT/100ML-% IV SOLN
INTRAVENOUS | Status: DC
  Administered 2019-02-21: 09:00:00 5.4 [IU]/h via INTRAVENOUS
  Filled 2019-02-21: qty 100

## 2019-02-21 MED ORDER — HEPARIN SODIUM (PORCINE) 5000 UNIT/ML IJ SOLN
5000.0000 [IU] | Freq: Three times a day (TID) | INTRAMUSCULAR | Status: DC
  Administered 2019-02-21 – 2019-02-23 (×6): 5000 [IU] via SUBCUTANEOUS
  Filled 2019-02-21 (×6): qty 1

## 2019-02-21 MED ORDER — ALTEPLASE 2 MG IJ SOLR: 2.0000 mg | Freq: Once | INTRAMUSCULAR | Status: DC | PRN

## 2019-02-21 MED ORDER — PRISMASOL BGK 4/2.5 32-4-2.5 MEQ/L IV SOLN: INTRAVENOUS | Status: DC

## 2019-02-21 MED ORDER — CALCITRIOL 0.25 MCG PO CAPS
0.2500 ug | ORAL_CAPSULE | Freq: Every day | ORAL | Status: DC
  Administered 2019-02-21 – 2019-02-23 (×3): 0.25 ug via ORAL
  Filled 2019-02-21 (×3): qty 1

## 2019-02-21 MED ORDER — HEPARIN SODIUM (PORCINE) 1000 UNIT/ML DIALYSIS: 1000.0000 [IU] | INTRAMUSCULAR | Status: DC | PRN

## 2019-02-21 MED ORDER — DEXTROSE-NACL 5-0.45 % IV SOLN
INTRAVENOUS | Status: DC
  Administered 2019-02-21: 14:00:00 via INTRAVENOUS

## 2019-02-21 MED ORDER — LANTHANUM CARBONATE 500 MG PO CHEW
1000.0000 mg | CHEWABLE_TABLET | Freq: Three times a day (TID) | ORAL | Status: DC
  Administered 2019-02-21 – 2019-02-23 (×6): 1000 mg via ORAL
  Filled 2019-02-21 (×8): qty 2

## 2019-02-21 MED ORDER — PRISMASOL BGK 4/2.5 32-4-2.5 MEQ/L REPLACEMENT SOLN: Status: DC

## 2019-02-21 NOTE — ED Notes (Signed)
Pt CBG was 83, notified Mallory(RN)

## 2019-02-21 NOTE — ED Notes (Signed)
ED TO INPATIENT HANDOFF REPORT  ED Nurse Name and Phone #: Sherrine Maples [phone]  S Name/Age/Gender [name] 38 y.o. female Room/Bed: 018C/018C  Code Status   Code Status: Full Code  Home/SNF/Other Home Patient oriented to: self, place, time and situation Is this baseline? Yes   Triage Complete: Triage complete  Chief Complaint hyperglycemic/fever  Triage Note [facility]- pt coming from dialysis with c/o fever. Had not taken any thing for the fever PTA. EMS administered 1000mg  of tylenol. Per pt no other sx of coronavirus reported. Pt was unable to get dialysis today.  T: 102 146/76 HR 98 RR 16 CBG 567   Allergies Allergies  Allergen Reactions  . Peanut-Containing Drug Products Anaphylaxis and Hives  . Chocolate Hives  . Morphine Rash    Level of Care/Admitting Diagnosis ED Disposition    ED Disposition Condition [facility] Area: [place] [100100]  Level of Care: Progressive [102]  Covid Evaluation: Person Under Investigation (PUI)  Diagnosis: Hyperglycemia due to type 2 diabetes mellitus [facility]) [[id number]]  Admitting Physician: Oda Kilts [[id number]]  Attending Physician: Oda Kilts [[id number]]  PT Class (Do Not Modify): Observation [104]  PT Acc Code (Do Not Modify): Observation [10022]       B Medical/Surgery History Past Medical History:  Diagnosis Date  . Diabetes mellitus without complication ([place])   . Dialysis patient ([place])   . Renal disorder    Past Surgical History:  Procedure Laterality Date  . AMPUTATION TOE Left   . AV FISTULA PLACEMENT       A IV Location/Drains/Wounds Patient Lines/Drains/Airways Status   Active Line/Drains/Airways    Name:   Placement date:   Placement time:   Site:   Days:   Peripheral IV 02/21/19 Left Forearm   02/21/19    0821    Forearm   less than 1   Peripheral IV 02/21/19 Left Hand   02/21/19    1151    Hand   less than 1          Intake/Output Last 24  hours No intake or output data in the 24 hours ending 02/21/19 1553  Labs/Imaging Results for orders placed or performed during the hospital encounter of 02/21/19 (from the past 48 hour(s))  CBG monitoring, ED     Status: Abnormal   Collection Time: 02/21/19  7:19 AM  Result Value Ref Range   Glucose-Capillary >600 (HH) 70 - 99 mg/dL   Comment 1 Notify RN    Comment 2 Document in Chart   Comprehensive metabolic panel     Status: Abnormal   Collection Time: 02/21/19  8:29 AM  Result Value Ref Range   Sodium 128 (L) 135 - 145 mmol/L   Potassium 4.6 3.5 - 5.1 mmol/L   Chloride 87 (L) 98 - 111 mmol/L   CO2 23 22 - 32 mmol/L   Glucose, Bld 675 (HH) 70 - 99 mg/dL    Comment: CRITICAL RESULT CALLED TO, READ BACK BY AND VERIFIED WITH: B.WELCH,RN @ 0953 02/21/2019 [id number]    BUN 54 (H) 6 - 20 mg/dL   Creatinine, Ser 11.50 (H) 0.44 - 1.00 mg/dL   Calcium 8.9 8.9 - 10.3 mg/dL   Total Protein 6.9 6.5 - 8.1 g/dL   Albumin 2.8 (L) 3.5 - 5.0 g/dL   AST 17 15 - 41 U/L   ALT 14 0 - 44 U/L   Alkaline Phosphatase 113 38 - 126 U/L   Total Bilirubin  0.6 0.3 - 1.2 mg/dL   GFR calc non Af Amer 4 (L) >60 mL/min   GFR calc Af Amer 4 (L) >60 mL/min   Anion gap 18 (H) 5 - 15    Comment: Performed at [facility] [address] [place], [place] [address]  CBC with Differential     Status: Abnormal   Collection Time: 02/21/19  8:29 AM  Result Value Ref Range   WBC 13.3 (H) 4.0 - 10.5 K/uL   RBC 3.84 (L) 3.87 - 5.11 MIL/uL   Hemoglobin 11.0 (L) 12.0 - 15.0 g/dL   HCT 34.3 (L) 36.0 - 46.0 %   MCV 89.3 80.0 - 100.0 fL   MCH 28.6 26.0 - 34.0 pg   MCHC 32.1 30.0 - 36.0 g/dL   RDW 13.1 11.5 - 15.5 %   Platelets 152 150 - 400 K/uL   nRBC 0.0 0.0 - 0.2 %   Neutrophils Relative % 80 %   Neutro Abs 10.7 (H) 1.7 - 7.7 K/uL   Lymphocytes Relative 9 %   Lymphs Abs 1.2 0.7 - 4.0 K/uL   Monocytes Relative 9 %   Monocytes Absolute 1.1 (H) 0.1 - 1.0 K/uL   Eosinophils Relative 1 %   Eosinophils  Absolute 0.2 0.0 - 0.5 K/uL   Basophils Relative 0 %   Basophils Absolute 0.0 0.0 - 0.1 K/uL   Immature Granulocytes 1 %   Abs Immature Granulocytes 0.10 (H) 0.00 - 0.07 K/uL    Comment: Performed at [facility] [address], [place], [place] [address]  Lactic acid, plasma     Status: None   Collection Time: 02/21/19  8:29 AM  Result Value Ref Range   Lactic Acid, Venous 1.8 0.5 - 1.9 mmol/L    Comment: Performed at [facility] [address], [place], [place] [address]  SARS Coronavirus 2 (CEPHEID- Performed in [facility] hospital lab), Hosp Order     Status: None   Collection Time: 02/21/19  8:40 AM   Specimen: Nasopharyngeal Swab  Result Value Ref Range   SARS Coronavirus 2 NEGATIVE NEGATIVE    Comment: (NOTE) If result is NEGATIVE SARS-CoV-2 target nucleic acids are NOT DETECTED. The SARS-CoV-2 RNA is generally detectable in upper and lower  respiratory specimens during the acute phase of infection. The lowest  concentration of SARS-CoV-2 viral copies this assay can detect is 250  copies / mL. A negative result does not preclude SARS-CoV-2 infection  and should not be used as the sole basis for treatment or other  patient management decisions.  A negative result may occur with  improper specimen collection / handling, submission of specimen other  than nasopharyngeal swab, presence of viral mutation(s) within the  areas targeted by this assay, and inadequate number of viral copies  (<250 copies / mL). A negative result must be combined with clinical  observations, patient history, and epidemiological information. If result is POSITIVE SARS-CoV-2 target nucleic acids are DETECTED. The SARS-CoV-2 RNA is generally detectable in upper and lower  respiratory specimens dur ing the acute phase of infection.  Positive  results are indicative of active infection with SARS-CoV-2.  Clinical  correlation with patient history and other diagnostic information is   necessary to determine patient infection status.  Positive results do  not rule out bacterial infection or co-infection with other viruses. If result is PRESUMPTIVE POSTIVE SARS-CoV-2 nucleic acids MAY BE PRESENT.   A presumptive positive result was obtained on  the submitted specimen  and confirmed on repeat testing.  While 2019 novel coronavirus  (SARS-CoV-2) nucleic acids may be present in the submitted sample  additional confirmatory testing may be necessary for epidemiological  and / or clinical management purposes  to differentiate between  SARS-CoV-2 and other Sarbecovirus currently known to infect humans.  If clinically indicated additional testing with an alternate test  methodology [phone]) is advised. The SARS-CoV-2 RNA is generally  detectable in upper and lower respiratory sp ecimens during the acute  phase of infection. The expected result is Negative. Fact Sheet for Patients:  StrictlyIdeas.no Fact Sheet for Healthcare Providers: BankingDealers.co.za This test is not yet approved or cleared by the [place] FDA and has been authorized for detection and/or diagnosis of SARS-CoV-2 by FDA under an Emergency Use Authorization (EUA).  This EUA will remain in effect (meaning this test can be used) for the duration of the COVID-19 declaration under Section 564(b)(1) of the Act, 21 U.S.C. section 360bbb-3(b)(1), unless the authorization is terminated or revoked sooner. Performed at [facility], [place] [address] [place], [place] [address]   CBG monitoring, ED     Status: Abnormal   Collection Time: 02/21/19  9:40 AM  Result Value Ref Range   Glucose-Capillary 559 (HH) 70 - 99 mg/dL   Comment 1 Notify RN   CBG monitoring, ED     Status: Abnormal   Collection Time: 02/21/19 10:44 AM  Result Value Ref Range   Glucose-Capillary 431 (H) 70 - 99 mg/dL   Comment 1 Notify RN    Comment 2 Document in Chart   CBG  monitoring, ED     Status: Abnormal   Collection Time: 02/21/19 12:12 PM  Result Value Ref Range   Glucose-Capillary 266 (H) 70 - 99 mg/dL  SARS Coronavirus 2 (CEPHEID - Performed in [facility] hospital lab), Hosp Order     Status: None   Collection Time: 02/21/19 12:49 PM   Specimen: Nasopharyngeal Swab  Result Value Ref Range   SARS Coronavirus 2 NEGATIVE NEGATIVE    Comment: (NOTE) If result is NEGATIVE SARS-CoV-2 target nucleic acids are NOT DETECTED. The SARS-CoV-2 RNA is generally detectable in upper and lower  respiratory specimens during the acute phase of infection. The lowest  concentration of SARS-CoV-2 viral copies this assay can detect is 250  copies / mL. A negative result does not preclude SARS-CoV-2 infection  and should not be used as the sole basis for treatment or other  patient management decisions.  A negative result may occur with  improper specimen collection / handling, submission of specimen other  than nasopharyngeal swab, presence of viral mutation(s) within the  areas targeted by this assay, and inadequate number of viral copies  (<250 copies / mL). A negative result must be combined with clinical  observations, patient history, and epidemiological information. If result is POSITIVE SARS-CoV-2 target nucleic acids are DETECTED. The SARS-CoV-2 RNA is generally detectable in upper and lower  respiratory specimens dur ing the acute phase of infection.  Positive  results are indicative of active infection with SARS-CoV-2.  Clinical  correlation with patient history and other diagnostic information is  necessary to determine patient infection status.  Positive results do  not rule out bacterial infection or co-infection with other viruses. If result is PRESUMPTIVE POSTIVE SARS-CoV-2 nucleic acids MAY BE PRESENT.   A presumptive positive result was obtained on the submitted specimen  and confirmed on repeat testing.  While 2019 novel coronavirus   (  SARS-CoV-2) nucleic acids may be present in the submitted sample  additional confirmatory testing may be necessary for epidemiological  and / or clinical management purposes  to differentiate between  SARS-CoV-2 and other Sarbecovirus currently known to infect humans.  If clinically indicated additional testing with an alternate test  methodology [phone]) is advised. The SARS-CoV-2 RNA is generally  detectable in upper and lower respiratory sp ecimens during the acute  phase of infection. The expected result is Negative. Fact Sheet for Patients:  StrictlyIdeas.no Fact Sheet for Healthcare Providers: BankingDealers.co.za This test is not yet approved or cleared by the [place] FDA and has been authorized for detection and/or diagnosis of SARS-CoV-2 by FDA under an Emergency Use Authorization (EUA).  This EUA will remain in effect (meaning this test can be used) for the duration of the COVID-19 declaration under Section 564(b)(1) of the Act, 21 U.S.C. section 360bbb-3(b)(1), unless the authorization is terminated or revoked sooner. Performed at [facility], [place] [address], [place], [place] [address]   CBG monitoring, ED     Status: None   Collection Time: 02/21/19  1:35 PM  Result Value Ref Range   Glucose-Capillary 88 70 - 99 mg/dL  D-dimer, quantitative     Status: Abnormal   Collection Time: 02/21/19  1:42 PM  Result Value Ref Range   D-Dimer, Quant 1.29 (H) 0.00 - 0.50 ug/mL-FEU    Comment: (NOTE) At the manufacturer cut-off of 0.50 ug/mL FEU, this assay has been documented to exclude PE with a sensitivity and negative predictive value of 97 to 99%.  At this time, this assay has not been approved by the FDA to exclude DVT/VTE. Results should be correlated with clinical presentation. Performed at [facility], [place] [address], [place], [place] [address]   Procalcitonin     Status: None   Collection Time:  02/21/19  1:42 PM  Result Value Ref Range   Procalcitonin 3.05 ng/mL    Comment:        Interpretation: PCT > 2 ng/mL: Systemic infection (sepsis) is likely, unless other causes are known. (NOTE)       Sepsis PCT Algorithm           Lower Respiratory Tract                                      Infection PCT Algorithm    ----------------------------     ----------------------------         PCT < 0.25 ng/mL                PCT < 0.10 ng/mL         Strongly encourage             Strongly discourage   discontinuation of antibiotics    initiation of antibiotics    ----------------------------     -----------------------------       PCT 0.25 - 0.50 ng/mL            PCT 0.10 - 0.25 ng/mL               OR       >80% decrease in PCT            Discourage initiation of  antibiotics      Encourage discontinuation           of antibiotics    ----------------------------     -----------------------------         PCT >= 0.50 ng/mL              PCT 0.26 - 0.50 ng/mL               AND       <80% decrease in PCT              Encourage initiation of                                             antibiotics       Encourage continuation           of antibiotics    ----------------------------     -----------------------------        PCT >= 0.50 ng/mL                  PCT > 0.50 ng/mL               AND         increase in PCT                  Strongly encourage                                      initiation of antibiotics    Strongly encourage escalation           of antibiotics                                     -----------------------------                                           PCT <= 0.25 ng/mL                                                 OR                                        > 80% decrease in PCT                                     Discontinue / Do not initiate                                             antibiotics Performed at [facility], [place] [address], [place], [place] [address]   Lactate dehydrogenase     Status: Abnormal   Collection Time: 02/21/19  1:42 PM  Result Value Ref Range   LDH 240 (H) 98 - 192 U/L    Comment: Performed at [facility], [place] [address], [place], [place] [address]  Ferritin     Status: Abnormal   Collection Time: 02/21/19  1:42 PM  Result Value Ref Range   Ferritin 1,203 (H) 11 - 307 ng/mL    Comment: Performed at [facility], [place] [address] [place], [place] [address]  Fibrinogen     Status: Abnormal   Collection Time: 02/21/19  1:42 PM  Result Value Ref Range   Fibrinogen 656 (H) 210 - 475 mg/dL    Comment: Performed at [facility] [address] [place], [place] [address]  C-reactive protein     Status: Abnormal   Collection Time: 02/21/19  1:42 PM  Result Value Ref Range   CRP 18.8 (H) <1.0 mg/dL    Comment: Performed at [facility] [address], [place], [place] [address]  Basic metabolic panel     Status: Abnormal   Collection Time: 02/21/19  1:42 PM  Result Value Ref Range   Sodium 135 135 - 145 mmol/L    Comment: REPEATED TO VERIFY   Potassium 4.2 3.5 - 5.1 mmol/L   Chloride 94 (L) 98 - 111 mmol/L   CO2 25 22 - 32 mmol/L   Glucose, Bld 109 (H) 70 - 99 mg/dL   BUN 56 (H) 6 - 20 mg/dL   Creatinine, Ser 11.80 (H) 0.44 - 1.00 mg/dL   Calcium 9.6 8.9 - 10.3 mg/dL   GFR calc non Af Amer 4 (L) >60 mL/min   GFR calc Af Amer 4 (L) >60 mL/min   Anion gap 16 (H) 5 - 15    Comment: Performed at [facility], [place] [address] [place],  [address]  Triglycerides     Status: None   Collection Time: 02/21/19  1:45 PM  Result Value Ref Range   Triglycerides 134 <150 mg/dL    Comment: Performed at [facility] [address], [place],  [address]  CBG monitoring, ED     Status: None   Collection Time: 02/21/19  2:07 PM  Result Value Ref Range   Glucose-Capillary 83 70 - 99 mg/dL   Comment 1 Notify RN    Comment 2  Document in Chart   CBG monitoring, ED     Status: None   Collection Time: 02/21/19  2:42 PM  Result Value Ref Range   Glucose-Capillary 87 70 - 99 mg/dL   Comment 1 Document in Chart   I-Stat beta hCG blood, ED     Status: Abnormal   Collection Time: 02/21/19  2:59 PM  Result Value Ref Range   I-stat hCG, quantitative 16.8 (H) <5 mIU/mL   Comment 3            Comment:   GEST. AGE      CONC.  (mIU/mL)   <=1 WEEK        5 - 50     2 WEEKS       50 - 500     3 WEEKS       100 - 10,000     4 WEEKS     1,000 - 30,000        FEMALE AND NON-PREGNANT FEMALE:     LESS THAN 5 mIU/mL   Beta-hydroxybutyric acid     Status: None  Collection Time: 02/21/19  2:59 PM  Result Value Ref Range   Beta-Hydroxybutyric Acid 0.17 0.05 - 0.27 mmol/L    Comment: Performed at [facility], [place] [address] [place], [place] [address]  CBG monitoring, ED     Status: Abnormal   Collection Time: 02/21/19  3:46 PM  Result Value Ref Range   Glucose-Capillary 122 (H) 70 - 99 mg/dL   Comment 1 Document in Chart    Dg Chest 1 View  Result Date: 02/21/2019 CLINICAL DATA:  Fever.  Dialysis patient. EXAM: CHEST  1 VIEW COMPARISON:  07/24/2017 FINDINGS: Both lungs are clear. Negative for a pneumothorax. Heart and mediastinum are within normal limits. Bone structures are unremarkable. IMPRESSION: No acute cardiopulmonary disease. Electronically Signed   By: Markus Daft M.D.   On: 02/21/2019 11:33    Pending Labs Unresulted Labs (From admission, onward)    Start     Ordered   02/22/19 8250  Basic metabolic panel  Tomorrow morning,   R     02/21/19 1534   02/21/19 5397  Basic metabolic panel  Once,   STAT     02/21/19 1534   02/21/19 1531  HIV antibody (Routine Testing)  Once,   STAT     02/21/19 1531   02/21/19 1531  CBC  (heparin)  Once,   STAT    Comments: Baseline for heparin therapy IF NOT ALREADY DRAWN.  Notify MD if PLT < 100 K.    02/21/19 1531   02/21/19 1356  Osmolality  Add-on,   AD      02/21/19 1355   02/21/19 1154  Respiratory Panel by PCR  (Respiratory virus panel with precautions)  Once,   STAT     02/21/19 1153   02/21/19 1116  Urinalysis, Routine w reflex microscopic  ONCE - STAT,   STAT     02/21/19 1115   02/21/19 0754  Blood culture (routine x 2)  BLOOD CULTURE X 2,   STAT     02/21/19 0753          Vitals/Pain Today's Vitals   02/21/19 1253 02/21/19 1255 02/21/19 1256 02/21/19 1515  BP: 120/75  120/75 129/71  Pulse:  86 86 88  Resp:   14 14  Temp:      TempSrc:      SpO2:  96% 100% 100%  PainSc:        Isolation Precautions Airborne and Contact precautions  Medications Medications  insulin regular, human (MYXREDLIN) 100 units/ 100 mL infusion (0 Units/hr Intravenous Paused 02/21/19 1445)  heparin injection 5,000 Units (has no administration in time range)  sodium chloride flush (NS) 0.9 % injection 3 mL (3 mLs Intravenous Not Given 02/21/19 1549)  calcitRIOL (ROCALTROL) capsule 0.25 mcg (0.25 mcg Oral Given 02/21/19 1549)  Lanthanum Carbonate PACK 1,000 mg (has no administration in time range)  multivitamin (RENA-VIT) tablet 1 tablet (1 tablet Oral Given 02/21/19 1548)  dextrose 5 % solution ( Intravenous New Bag/Given 02/21/19 1548)  Chlorhexidine Gluconate Cloth 2 % PADS 6 each (has no administration in time range)  insulin glargine (LANTUS) injection 30 Units (has no administration in time range)  insulin aspart (novoLOG) injection 0-9 Units (has no administration in time range)    Mobility walks Low fall risk   Focused Assessments    R Recommendations: See Admitting Provider Note  Report given to:   Additional Notes:

## 2019-02-21 NOTE — ED Notes (Signed)
Attempted report x1. 

## 2019-02-21 NOTE — Consult Note (Signed)
Renal Service Consult Note [facility]  [name] 02/21/2019 [name] Requesting Physician: Dr Rebeca Alert  Reason for Consult:  ESRD pt w/ fevers and myalgias HPI: The patient is a 38 y.o. year-old with hx of DM2 age 40, HTN, ESRD on HD x 5 years presented w/ several days hx of fevers, malaise, myalgias , chills and body aches. Went to HD where temp was 101.5, did not get HD and went to ED.  In ED blood sugars were up > 600.  Pt was admitted and started on IV insulin gtt protocol.  We are asked to see for ESRD.   Patient denies any CP, prod cough, HA, confusion, n/v/d.  No recent dialysis issues. Is supposed to have surgery later this month to improve her AVF.     ROS  denies CP  no joint pain   no HA  no blurry vision  no rash  no diarrhea  no nausea/ vomiting   Past Medical History  Past Medical History:  Diagnosis Date  . Diabetes mellitus without complication ([place])   . Dialysis patient ([place])   . Renal disorder    Past Surgical History  Past Surgical History:  Procedure Laterality Date  . AMPUTATION TOE Left   . AV FISTULA PLACEMENT     Family History  Family History  Problem Relation Age of Onset  . Diabetes Mother   . Diabetes Father    Social History  reports that she has never smoked. She has never used smokeless tobacco. She reports that she does not drink alcohol or use drugs. Allergies  Allergies  Allergen Reactions  . Peanut-Containing Drug Products Anaphylaxis and Hives  . Chocolate Hives  . Morphine Rash   Home medications Prior to Admission medications   Medication Sig Start Date End Date Taking? Authorizing Provider  calcitRIOL (ROCALTROL) 0.25 MCG capsule Take 0.25 mcg by mouth daily.    Yes [provider]  FOSRENOL 1000 MG PACK Take 1,000 mg by mouth 3 (three) times daily with meals.  12/25/18  Yes [provider]  insulin aspart (NOVOLOG) 100 UNIT/ML injection Inject 30 Units into the skin 3 (three)  times daily with meals.    Yes [provider]  insulin glargine (LANTUS) 100 UNIT/ML injection Inject 5 Units into the skin at bedtime.    Yes [provider]  multivitamin (RENA-VIT) TABS tablet Take 1 tablet by mouth daily.  12/17/18  Yes [provider]   Liver Function Tests Recent Labs  Lab 02/21/19 0829  AST 17  ALT 14  ALKPHOS 113  BILITOT 0.6  PROT 6.9  ALBUMIN 2.8*   No results for input(s): LIPASE, AMYLASE in the last 168 hours. CBC Recent Labs  Lab 02/21/19 0829  WBC 13.3*  NEUTROABS 10.7*  HGB 11.0*  HCT 34.3*  MCV 89.3  PLT 390   Basic Metabolic Panel Recent Labs  Lab 02/21/19 0829  NA 128*  K 4.6  CL 87*  CO2 23  GLUCOSE 675*  BUN 54*  CREATININE 11.50*  CALCIUM 8.9   Iron/TIBC/Ferritin/ %Sat No results found for: IRON, TIBC, FERRITIN, IRONPCTSAT  Vitals:   02/21/19 1130 02/21/19 1253 02/21/19 1255 02/21/19 1256  BP: 113/63 120/75  120/75  Pulse:   86 86  Resp:    14  Temp:      TempSrc:      SpO2:   96% 100%    Exam Gen aler,t on distress, calm and healthy appearing No rash, cyanosis  or gangrene Sclera anicteric, throat clear  No jvd or bruits Chest clear bilat to bases RRR no MRG Abd soft ntnd no mass or ascites +bs GU defer MS no joint effusions or deformity Ext no LE or UE edema, no wounds or ulcers Neuro is alert, Ox 3 , nf RUA AVF+bruit +aneurysms    Home meds:  - fosrenol ac tid  - insulin lantus 5u hs/ insulin aspart 30 tid ac  - MVI    [facility]pt HD: [facility]  3h 47min  64kg  2/2 bath  P2  Hep 4000    R AVF - darbe 25 ug q4wks, last 6/23  - hect 8 ug  - venofer 50 /wk   CXR - clear  UA pending   Assessment/ Plan: 1. Fevers - per primary, no specific source yet 2. ESRD on HD: plan HD here tonight, possibly tomorrow depending on volume of pts 3. Vol/ HTN - no meds here or at home 4. Uncont DM2 - started on IV insulin drip protocol 5. Anemia ckd - on darbe last dose 6/23, Hb 11 here   6. MBD ckd - cont meds      Kelly Splinter  MD 02/21/2019, 2:42 PM

## 2019-02-21 NOTE — ED Triage Notes (Signed)
[facility]- pt coming from dialysis with c/o fever. Had not taken any thing for the fever PTA. EMS administered 1000mg  of tylenol. Per pt no other sx of coronavirus reported. Pt was unable to get dialysis today.  T: 102 146/76 HR 98 RR 16 CBG 567

## 2019-02-21 NOTE — ED Notes (Signed)
CRITICAL VALUE ALERT  Critical Value: Glucose 675  Date & Time Notied:  02/21/19 0946  Provider Notified: Margarita Mail, PA  Orders Received/Actions taken: pt currently on glucostabilizer.

## 2019-02-21 NOTE — ED Notes (Signed)
Pt CBG was 431, notified Brooke(RN)

## 2019-02-21 NOTE — H&P (Signed)
Date: 02/21/2019               Patient Name:  [name] MRN: [id number]  DOB: [birth year] Age / Sex: 38 y.o., female   PCP: [facility]         Medical Service: Internal Medicine Teaching Service         Attending Physician: Dr. Rebeca Alert, Raynaldo Opitz, MD    First Contact: Dr. Court Joy Pager: [phone]  Second Contact: Dr. Trilby Drummer Pager: [phone]       After Hours (After 5p/  First Contact Pager: [phone]  weekends / holidays): Second Contact Pager: [phone]   Chief Complaint: Fever  History of Present Illness: [name] is a 38 yo F with Hx of Diabetes, ESRD (HD TTS), PVD, Secondary HPTH, and right toe amputation who presented from her dialysis center with fevers. She has had 3 days of body aches and 1 day of fevers and chills. She was noted to be febrile to 101 at dialysis today (she did not have her treatment done) and was sent to the ED for further evaluation (in part due to COVID 19 suspicion).  She was noted to have an elevated glucose to 675 in the ED with elevated anion gap but not acidosis. She does report polydipsia, polyuria and decreased appetite for the past several days. She denies cough, SOB, Chest pain, Nausea, abdominal pain, or sick contact. ED course also notable for WBC of 13 with PMN predominance and hyponatremia to 128 (normal when corrected). CXR was WNL and U/A is pending. Initial COVID-19 test was negative, but sample was poor per EDP.   Meds: Current Meds  Medication Sig  . calcitRIOL (ROCALTROL) 0.25 MCG capsule Take 0.25 mcg by mouth daily.   Marland Kitchen FOSRENOL 1000 MG PACK Take 1,000 mg by mouth 3 (three) times daily with meals.   . insulin aspart (NOVOLOG) 100 UNIT/ML injection Inject 30 Units into the skin 3 (three) times daily with meals.   . insulin glargine (LANTUS) 100 UNIT/ML injection Inject 5 Units into the skin at bedtime.   . multivitamin (RENA-VIT) TABS tablet Take 1 tablet by mouth daily.      Allergies: Allergies as of  02/21/2019 - Review Complete 02/21/2019  Allergen Reaction Noted  . Peanut-containing drug products Anaphylaxis and Hives 02/21/2019  . Chocolate Hives 05/28/2018  . Morphine Rash 01/05/2015   Past Medical History:  Diagnosis Date  . Diabetes mellitus without complication ([place])   . Dialysis patient ([place])   . Renal disorder     Family History:  Family History  Problem Relation Age of Onset  . Diabetes Mother   . Diabetes Father   - Confirmed on admission  Social History:  Social History   Tobacco Use  . Smoking status: Never Smoker  . Smokeless tobacco: Never Used  Substance Use Topics  . Alcohol use: No    Frequency: Never  . Drug use: No  - Confirmed on admission  Review of Systems: A complete ROS was negative except as per HPI.  Physical Exam: Blood pressure 129/71, pulse 88, temperature 99.2 F (37.3 C), temperature source Oral, resp. rate 14, SpO2 100 %. Physical Exam Gen: Lying in bed, comfortable HEENT: MMM, no rhinorrhea CV: RRR, no m/r/g, no JVD Pulm: CTAB, normal respiratory effort Abd: Soft, non-tender, normal bowel sounds Extr: No synovitis, no edema, R arm AV fistula with palpable thrill Neuro: alert, normal speech, intact strength and sensation Skin: No significant rashes  Labs and Studies: Glucose 675 Bicarb 23 AG 18 Na 128 LFTs normal CBC with WBC 16, 80% PMNs, 9% lymphs, 1% bands, ALC 1.2 LDH 240, Ferritin 1203, CRP 19, procalcitonin 3.1, TG 134, D-dimer 1.3, fibrinogen 656  EKG: personally reviewed my interpretation is NSR  CXR: personally reviewed my interpretation is no acute disease  Assessment & Plan by Problem: Active Problems:   Hyperglycemia due to type 2 diabetes mellitus ([place])  Hyperglycemia: Patient noted to be hyperglycemic to the 600s in ED. She has DM and has noted polyuria and polydipsia as well as decreased appetite. AG elevated but normal bicarb. Likely due to uremia in ESRD patient. On 30U Lantus and 5U Novolog TID at  home. Treated with Insulin gtt and IVF in ED. CBGs normalized. B-Hydroxybutryric acid negative, suspect gap is due to Uremia. - Lantus 30U - SSI-S - CBGs - U/A  Febrile Illness High risk for COVID-19: 3 Days of of body aches, 1 day of fever and chills. Initial SARS-CoV2 test negative but poor sample quality, repeat also negative. WBC mildly up with relative lymphopenia (ALC 1.2). Elevated CRP, Fibrinogen, Ferritin, LDH and D-Dimer are all suggestive of COVID-19, though nonspecific and difficult to interpret in a dialysis patient. RVP negative, no other clear source of infection. - If additional fevers, would send blood culture given elevated procalcitonin - AM CRP, Fibrinogen, Ferritin, LDH and D-Dimer  - Supportive Care - Continue airborne and contact precautions given high concern for COVID-19 to protect staff  ESRD (HD TTS). Did not finish HD session on day of admission (7/11).  Nephro consulted in The ED. - Appreciate Nephrology's assistance - HD TTS or per availability while inpatient - Continue home Rena-vit and Calcitriol - Fosrenol when eating meals  Dispo: Admit patient to Observation with expected length of stay less than 2 midnights.  Signed: Neva Seat, MD 02/21/2019, 3:55 PM  Pager: [phone]    Internal Medicine Attending Attestation:   I have seen and evaluated this patient and I have discussed the plan of care with the house staff. Please see their note for complete details. I concur with their findings, and have edited the above note to reflect my additional findings, assessment, and plan.  Oda Kilts, MD 02/21/2019, 6:28 PM

## 2019-02-21 NOTE — ED Notes (Signed)
Notified Brooke(RN) of pt CBG HI

## 2019-02-21 NOTE — Plan of Care (Signed)
  Problem: Nutrition: Goal: Adequate nutrition will be maintained Outcome: Progressing   Problem: Pain Managment: Goal: General experience of comfort will improve Outcome: Progressing   Problem: Safety: Goal: Ability to remain free from injury will improve Outcome: Progressing   

## 2019-02-21 NOTE — ED Provider Notes (Signed)
[facility] EMERGENCY DEPARTMENT Provider Note   CSN: [id number] Arrival date & time: 02/21/19  0715     History   Chief Complaint Chief Complaint  Patient presents with  . Fever    HPI [name] is a 38 y.o. female who  has a past medical history of Diabetes mellitus without complication ([place]), Dialysis patient [facility]), and Renal disorder. SHe dialyses T/Th/Sa. Patient states she never misses an appointment and last dialyzed on 2 days ago. She c/o sxs of fatigue, malaise, decreased appetite, shaking chills and body aches for the pas 5 days. She went to dialysis today and was told that she had a fever of 101.5.  She was sent by EMS for evaluation here in the emergency department.  She was given a gram of Tylenol prior to arrival with a resolution of her fever.She denies loss of sense of taste or smell, she does produce urine but denies frequency or urgency and denies dysuria.  Is insulin dependent diabetic and notably hyperglycemic here.  She denies abdominal pain, nausea, vomiting, diarrhea.  Patient also denies cough, shortness of breath.  HPI  Past Medical History:  Diagnosis Date  . Diabetes mellitus without complication ([place])   . Dialysis patient ([place])   . Renal disorder     Patient Active Problem List   Diagnosis Date Noted  . Anemia 02/11/2019  . Diabetes mellitus ([place]) 02/11/2019  . HLD (hyperlipidemia) 02/11/2019  . PTSD (post-traumatic stress disorder) 02/11/2019  . PVD (peripheral vascular disease) ([place]) 02/11/2019  . Retinopathy 02/11/2019  . Secondary hyperparathyroidism of renal origin ([place]) 02/11/2019  . Cataracts, bilateral 01/05/2015  . Hyperphosphatemia 01/05/2015  . Status post amputation of toe of right foot ([place]) 01/05/2015    Past Surgical History:  Procedure Laterality Date  . AMPUTATION TOE Left   . AV FISTULA PLACEMENT       OB History   No obstetric history on file.      Home Medications    Prior to Admission  medications   Medication Sig Start Date End Date Taking? Authorizing Provider  calcitRIOL (ROCALTROL) 0.25 MCG capsule Take by mouth.    [provider]  calcium acetate (PHOSLO) 667 MG capsule Take by mouth.    [provider]  calcium acetate (PHOSLO) 667 MG capsule TAKE 2 CAPSULES BY MOUTH THREE TIMES DAILY WITH MEALS 11/11/18   [provider]  dicyclomine (BENTYL) 20 MG tablet Take 1 tablet (20 mg total) by mouth 2 (two) times daily. 09/14/17   Valarie Merino, MD  FOSRENOL 1000 MG PACK MIX 1 PACKET WITH SMALL AMOUNT OF APPLESAUCE OR SIMILAR FOOD. EAT IMMEDIATELY 3 TIMES/DAY WITH MEALS & 1 PACKET WITH SNACKS TWICE A DAY 12/25/18   [provider]  insulin aspart (NOVOLOG) 100 UNIT/ML injection Inject into the skin.    [provider]  insulin glargine (LANTUS) 100 UNIT/ML injection Inject into the skin.    [provider]  multivitamin (RENA-VIT) TABS tablet TAKE 1 TABLET BY MOUTH EVERY MORNING   THIS IS YOUR RENAL VITAMIN 12/17/18   [provider]    Family History Family History  Problem Relation Age of Onset  . Diabetes Mother   . Diabetes Father     Social History Social History   Tobacco Use  . Smoking status: Never Smoker  . Smokeless tobacco: Never Used  Substance Use Topics  . Alcohol use: No    Frequency: Never  . Drug use: No  Allergies   Chocolate and Morphine   Review of Systems Review of Systems  Ten systems reviewed and are negative for acute change, except as noted in the HPI.   Physical Exam Updated Vital Signs BP 129/66 (BP Location: Left Arm)   Pulse 94   Temp 99.2 F (37.3 C) (Oral)   Resp 14   SpO2 98%   Physical Exam Vitals signs and nursing note reviewed.  Constitutional:      General: She is not in acute distress.    Appearance: She is well-developed. She is ill-appearing. She is not toxic-appearing or diaphoretic.  HENT:     Head: Normocephalic and atraumatic.  Eyes:      General: No scleral icterus.    Conjunctiva/sclera: Conjunctivae normal.  Neck:     Musculoskeletal: Normal range of motion.  Cardiovascular:     Rate and Rhythm: Normal rate and regular rhythm.     Heart sounds: Normal heart sounds. No murmur. No friction rub. No gallop.      Comments: Fistula present in the right upper extremity with palpable thrill Pulmonary:     Effort: Pulmonary effort is normal. No respiratory distress.     Breath sounds: Normal breath sounds.  Abdominal:     General: Bowel sounds are normal. There is no distension.     Palpations: Abdomen is soft. There is no mass.     Tenderness: There is no abdominal tenderness. There is no guarding.  Skin:    General: Skin is warm and dry.  Neurological:     Mental Status: She is alert and oriented to person, place, and time.  Psychiatric:        Behavior: Behavior normal.      ED Treatments / Results  Labs (all labs ordered are listed, but only abnormal results are displayed) Labs Reviewed  CBG MONITORING, ED - Abnormal; Notable for the following components:      Result Value   Glucose-Capillary >600 (*)    All other components within normal limits  SARS CORONAVIRUS 2 (HOSPITAL ORDER, [place] [facility])  CULTURE, BLOOD (ROUTINE X 2)  CULTURE, BLOOD (ROUTINE X 2)  COMPREHENSIVE METABOLIC PANEL  CBC WITH DIFFERENTIAL/PLATELET  LACTIC ACID, PLASMA  I-STAT BETA HCG BLOOD, ED ([facility], [facility], [facility] ONLY)    EKG None  Radiology No results found.  Procedures Procedures (including critical care time)  Medications Ordered in ED Medications  dextrose 5 %-0.45 % sodium chloride infusion (has no administration in time range)  insulin regular, human (MYXREDLIN) 100 units/ 100 mL infusion (has no administration in time range)     Initial Impression / Assessment and Plan / ED Course  I have reviewed the triage vital signs and the nursing notes.  Pertinent labs & imaging results that were available  during my care of the patient were reviewed by me and considered in my medical decision making (see chart for details).  Clinical Course as of Feb 20 1138  Sat Feb 21, 2019  0940 WBC(!): 13.3 [AH]  [phone] Nurse states that the patient did not tolerate the test for coronavirus well and she feels this collection was likely not the best sample.  SARS Coronavirus 2: NEGATIVE [AH]  1122 DG Chest 1 View [AH]    Clinical Course User Index [AH] Vernette Moise, PA-C       JJ:OACZY, hyperglycemia VS: BP 106/60 (BP Location: Left Arm)   Pulse 84   Temp 99.2 F (37.3 C) (Oral)   Resp  14   SpO2 100%  XB:MWUXLKG is gathered by patient and EMR. DDX: Patient with vague viral-like symptoms.  Unsure clear etiology.  May be secondary to viral or bacterial infection.  Patient has not yet produced urine.   The coronavirus test is negative however patient was highly intolerant of NP swab and this may not represent an accurate result.  Labs: I reviewed the labs which show negative coronavirus, markedly elevated blood glucose, CMP shows mild hyponatremia in the setting of elevated glucose glucose, elevated BUN and creatinine, anion gap of 18 likely from uremia.  Her white blood cell count is 13.3.  Lactic acid 1.8 Imaging: I personally reviewed the images (1 view chest x-ray) which show(s) no focal or patchy infiltrates, no other acute abnormalities EKG:  MDM: Patient febrile, she has elevated blood glucose and will need to come into the hospital for management of her sugars.  She has nonspecific constitutional symptoms with fever I spoken with the internal medicine teaching service who will admit the patient.  I have also spoken with Dr. Jonnie Finner who will consult on the patient for nephrology service. Patient disposition: Admit Patient condition: Stable. The patient appears reasonably stabilized for admission considering the current resources, flow, and capabilities available in the ED at this time, and I doubt  any other [facility] requiring further screening and/or treatment in the ED prior to admission.   Final Clinical Impressions(s) / ED Diagnoses   Final diagnoses:  Fever, unspecified fever cause  Hyperglycemia    ED Discharge Orders    None       Margarita Mail, PA-C 02/21/19 1144    Charlesetta Shanks, MD 02/22/19 [phone]

## 2019-02-22 DIAGNOSIS — R011 Cardiac murmur, unspecified: Secondary | ICD-10-CM

## 2019-02-22 DIAGNOSIS — N186 End stage renal disease: Secondary | ICD-10-CM | POA: Diagnosis not present

## 2019-02-22 DIAGNOSIS — E1122 Type 2 diabetes mellitus with diabetic chronic kidney disease: Secondary | ICD-10-CM | POA: Diagnosis not present

## 2019-02-22 DIAGNOSIS — E1165 Type 2 diabetes mellitus with hyperglycemia: Secondary | ICD-10-CM | POA: Diagnosis not present

## 2019-02-22 DIAGNOSIS — R509 Fever, unspecified: Secondary | ICD-10-CM | POA: Diagnosis not present

## 2019-02-22 LAB — D-DIMER, QUANTITATIVE: D-Dimer, Quant: 1.22 ug/mL-FEU — ABNORMAL HIGH (ref 0.00–0.50)

## 2019-02-22 LAB — BASIC METABOLIC PANEL
Anion gap: 18 — ABNORMAL HIGH (ref 5–15)
BUN: 63 mg/dL — ABNORMAL HIGH (ref 6–20)
CO2: 25 mmol/L (ref 22–32)
Calcium: 9.5 mg/dL (ref 8.9–10.3)
Chloride: 89 mmol/L — ABNORMAL LOW (ref 98–111)
Creatinine, Ser: 12.92 mg/dL — ABNORMAL HIGH (ref 0.44–1.00)
GFR calc Af Amer: 4 mL/min — ABNORMAL LOW (ref >60)
GFR calc non Af Amer: 3 mL/min — ABNORMAL LOW (ref >60)
Glucose, Bld: 107 mg/dL — ABNORMAL HIGH (ref 70–99)
Potassium: 4 mmol/L (ref 3.5–5.1)
Sodium: 132 mmol/L — ABNORMAL LOW (ref 135–145)

## 2019-02-22 LAB — FIBRINOGEN: Fibrinogen: 649 mg/dL — ABNORMAL HIGH (ref 210–475)

## 2019-02-22 LAB — FERRITIN: Ferritin: 1217 ng/mL — ABNORMAL HIGH (ref 11–307)

## 2019-02-22 LAB — GLUCOSE, CAPILLARY
Glucose-Capillary: 188 mg/dL — ABNORMAL HIGH (ref 70–99)
Glucose-Capillary: 144 mg/dL — ABNORMAL HIGH (ref 70–99)
Glucose-Capillary: 94 mg/dL (ref 70–99)
Glucose-Capillary: 200 mg/dL — ABNORMAL HIGH (ref 70–99)
Glucose-Capillary: 157 mg/dL — ABNORMAL HIGH (ref 70–99)
Glucose-Capillary: 360 mg/dL — ABNORMAL HIGH (ref 70–99)

## 2019-02-22 LAB — HIV ANTIBODY (ROUTINE TESTING W REFLEX): HIV Screen 4th Generation wRfx: NONREACTIVE

## 2019-02-22 LAB — LACTATE DEHYDROGENASE: LDH: 149 U/L (ref 98–192)

## 2019-02-22 LAB — C-REACTIVE PROTEIN: CRP: 18.4 mg/dL — ABNORMAL HIGH (ref <1.0)

## 2019-02-22 MED ORDER — ACETAMINOPHEN 325 MG PO TABS
650.0000 mg | ORAL_TABLET | ORAL | Status: DC | PRN
  Administered 2019-02-22: 650 mg via ORAL
  Filled 2019-02-22 (×2): qty 2

## 2019-02-22 MED ORDER — INSULIN ASPART 100 UNIT/ML ~~LOC~~ SOLN
2.0000 [IU] | Freq: Once | SUBCUTANEOUS | Status: AC
  Administered 2019-02-22: 2 [IU] via SUBCUTANEOUS

## 2019-02-22 MED ORDER — HEPARIN SODIUM (PORCINE) 1000 UNIT/ML DIALYSIS
2000.0000 [IU] | INTRAMUSCULAR | Status: DC | PRN
  Filled 2019-02-22: qty 2

## 2019-02-22 NOTE — Progress Notes (Signed)
  Date: 02/22/2019  Patient name: [name]  Medical record number: [id number]  Date of birth: 1980-11-26    Subjective: Feels fine, no fevers, breathing fine. Awaiting dialysis today. Wants to go home.  Objective:  Vital signs in last 24 hours: Vitals:   02/22/19 1430 02/22/19 1445 02/22/19 1500 02/22/19 1515  BP: 91/60 (!) 99/59 117/69 94/69  Pulse: 83 81 88 93  Resp: (!) 23 14 17  (!) 24  Temp:      TempSrc:      SpO2: 100% 100% 100% 100%  Weight:      Height:        Physical Exam: General: Lying in bed, resting, easily awoken HEENT: MMM, no rhinorrhea Cardiac: RRR, systolic murmur at RUSB Pulm: CTAB, normal respiratory effort Abdom: Soft, nontender, normal bowel sounds Extrem: Warm, well perfused, no synovitis, no edema Neuro: Alert, normal speech, strength and sensation intact and equal  Significant new test results: Glucose 83-200 Sodium 135, 132 LDH 240, 149 Ferritin 1200 CRP 18 WBC 13.3, 16.3 D-dimer 1.3, 1.2 Fibrinogen 650  Assessment/Plan:  Principal Problem:   Suspected Covid-19 Virus Infection Active Problems:   Hyperglycemia due to type 2 diabetes mellitus (HCC)   ESRD on dialysis ([name])   38 year old woman with ESRD on HD and T2DM admitted for fever and body aches as well as hyperglycemia.  No further fevers since admission, hyperglycemia has resolved, doing well, plan for HD today.  Hyperglycemia: Glucose elevated into the 600s on arrival.  Although AG elevated, normal bicarb, and no ketosis, likely elevated gap is due to uremia in ESRD patient. On 30U Lantus and 5U Novolog TID at home, but missed a few doses before coming in. - Lantus 30U - SSI-S - CBGs qAC and HS  Suspected COVID-19: 3 days of of body aches, 1 day of fever and chills. Initial SARS-CoV2 test negative but poor sample quality, repeat also negative. WBC mildly up with relative lymphopenia (ALC 1.2). Elevated CRP, Fibrinogen, Ferritin, LDH and D-Dimer are all suggestive of  COVID-19, though nonspecific and difficult to interpret in a dialysis patient. RVP negative, no other clear source of infection. Labs stable from yesterday - If additional fevers, would send blood culture given elevated procalcitonin - Supportive Care - Continue airborne and contact precautions given high concern for COVID-19 to protect staff  ESRD (HD TTS). Did not finish HD session on day of admission (7/11), plan for HD today - Appreciate Nephrology's consultation - Continue home Rena-vit and Calcitriol - Fosrenol when eating meals - Will need to clarify requirements to return to dialysis center given suspected COVID  FEN: Renal diet Prophylaxis: subq heparin TID Code Status: FULL  Dispo: Anticipated discharge in approximately 0-1 day(s) as long as she will be able to resume outpt dialysis  [name], M.D., Ph.D. 02/22/2019, 3:31 PM

## 2019-02-22 NOTE — Progress Notes (Signed)
[facility] Progress Note  Subjective: has no c/o's today, wants to go home.  Had 2nd covid test done which was also negative, nevertheless due to concerns about possible COVID infection pt was admitted to 2W COVID wing as PUI.   Vitals:   02/22/19 1343 02/22/19 1350 02/22/19 1400 02/22/19 1415  BP: 125/70 99/60 (!) 101/57 (!) 85/50  Pulse: 85 85 86 82  Resp: (!) 21 (!) 21 12 (!) 22  Temp:      TempSrc:      SpO2: 99% 100% 99% 99%  Weight:      Height:        Inpatient medications: . calcitRIOL  0.25 mcg Oral Daily  . Chlorhexidine Gluconate Cloth  6 each Topical Q0600  . heparin  5,000 Units Subcutaneous Q8H  . insulin aspart  0-9 Units Subcutaneous TID WC  . lanthanum  1,000 mg Oral TID WC  . multivitamin  1 tablet Oral Daily  . sodium chloride flush  3 mL Intravenous Q12H    heparin    Exam: Gen calm and in no distress, no cough No jvd or bruits Chest clear bilat to bases RRR no MRG Abd soft ntnd no mass or ascites +bs Ext no LE edema Neuro is alert, Ox 3 , nf RUA AVF+bruit +aneurysms   CXR 7/11 - negative   Home meds:  - fosrenol ac tid  - insulin lantus 5u hs/ insulin aspart 30 tid ac  - MVI    Outpt HD: [facility]  3h 34min  64kg  2/2 bath  P2  Hep 4000    R AVF - darbe 25 ug q4wks, last 6/23  - hect 8 ug  - venofer 50 /wk   CXR - clear  UA pending   Assessment/ Plan: 1. Fevers - per primary, no specific source yet 2. ESRD - on HD TTS.  For HD today (rolled over from Sat) 3. Vol/ HTN - no meds here or at home. At dry wt, no vol ^ on exam  4. Uncont DM2 - BS's better, IV insulin dc'd at midnight, on SQ insulin 5. Anemia ckd - on darbe last dose 6/23, Hb 11 here  6. MBD ckd - cont meds    [facility] 02/22/2019, 2:20 PM  Iron/TIBC/Ferritin/ %Sat    Component Value Date/Time   FERRITIN 1,217 (H) 02/22/2019 0622   Recent Labs  Lab 02/21/19 0829  02/22/19 0622  NA 128*   < > 132*  K 4.6   <  > 4.0  CL 87*   < > 89*  CO2 23   < > 25  GLUCOSE 675*   < > 107*  BUN 54*   < > 63*  CREATININE 11.50*   < > 12.92*  CALCIUM 8.9   < > 9.5  ALBUMIN 2.8*  --   --    < > = values in this interval not displayed.   Recent Labs  Lab 02/21/19 0829  AST 17  ALT 14  ALKPHOS 113  BILITOT 0.6  PROT 6.9   Recent Labs  Lab 02/21/19 1656  WBC 16.3*  HGB 12.0  HCT 37.2  PLT 184

## 2019-02-23 DIAGNOSIS — R509 Fever, unspecified: Secondary | ICD-10-CM | POA: Diagnosis not present

## 2019-02-23 DIAGNOSIS — E1165 Type 2 diabetes mellitus with hyperglycemia: Secondary | ICD-10-CM | POA: Diagnosis not present

## 2019-02-23 DIAGNOSIS — N186 End stage renal disease: Secondary | ICD-10-CM | POA: Diagnosis not present

## 2019-02-23 DIAGNOSIS — E1122 Type 2 diabetes mellitus with diabetic chronic kidney disease: Secondary | ICD-10-CM | POA: Diagnosis not present

## 2019-02-23 LAB — GLUCOSE, CAPILLARY
Glucose-Capillary: 249 mg/dL — ABNORMAL HIGH (ref 70–99)
Glucose-Capillary: 272 mg/dL — ABNORMAL HIGH (ref 70–99)

## 2019-02-23 MED ORDER — INSULIN GLARGINE 100 UNIT/ML ~~LOC~~ SOLN: 30.0000 [IU] | Freq: Every day | SUBCUTANEOUS | 11 refills | Status: DC

## 2019-02-23 MED ORDER — INSULIN ASPART 100 UNIT/ML ~~LOC~~ SOLN: 5.0000 [IU] | Freq: Three times a day (TID) | SUBCUTANEOUS | 11 refills | Status: DC

## 2019-02-23 NOTE — Discharge Summary (Signed)
IMTS Attending Discharge Summary  Name: [name] MRN: [id number] DOB: Feb 18, 1981 38 y.o. PCP: [facility]  Date of Admission: 02/21/2019  7:15 AM Date of Discharge: 02/23/2019 Attending Physician: Sid Falcon, MD  Discharge Diagnosis: Principal Problem:   Suspected Covid-19 Virus Infection Active Problems:   Hyperglycemia due to type 2 diabetes mellitus ([place])   ESRD on dialysis [facility])   Fever   Discharge Medications: Allergies as of 02/23/2019      Reactions   Peanut-containing Drug Products Anaphylaxis, Hives   Chocolate Hives   Morphine Rash      Medication List    TAKE these medications   calcitRIOL 0.25 MCG capsule Commonly known as: ROCALTROL Take 0.25 mcg by mouth daily.   Fosrenol 1000 MG Pack Generic drug: Lanthanum Carbonate Take 1,000 mg by mouth 3 (three) times daily with meals.   insulin aspart 100 UNIT/ML injection Commonly known as: NovoLOG Inject 5 Units into the skin 3 (three) times daily with meals. What changed: how much to take   insulin glargine 100 UNIT/ML injection Commonly known as: Lantus Inject 0.3 mLs (30 Units total) into the skin at bedtime. What changed: how much to take   multivitamin Tabs tablet Take 1 tablet by mouth daily.       Disposition and follow-up:   TraceyTracey Walker was discharged from [facility] in Stable condition.  At the hospital follow up visit please address:  1.  Fever curve, ability to make it to dialysis, diabetes control  2.  Labs / imaging needed at time of follow-up: routine labs with dialysis  3.  Pending labs/ test needing follow-up: [facility] SARS-CoV2 swab, blood culture results   Follow-up Appointments: [place], [facility]. Schedule an appointment as soon as possible for a visit in 2 week(s).   Specialty: Pediatrics Why: Incorrect Contact Information.  Also, patient is not an established patient in  this listed office.  Care Management and RN notified. Contact information: [place] [address] [place]. Schedule an appointment as soon as possible for a visit in 1 week.   Why: Appointment scheduled for Tuesday, March 10, 2019 at 2:30 p.m. Contact information: [place] [id number] [place] [facility] Course by problem list: Suspected Covid-19 Virus Infection [name] presented from HD due to 3 days of myalgias and fevers.  She was febrile at HD.  She had elevated inflammatory markers and given her signs and symptoms she was tested for COVID-19.  The initial tests were negative.  Repeat testing to the [place] was sent and will return in 2-3 days after discharge.  She was advised to quarantine until the tests came back.  There was no obvious other source of infection.  She did not have any dysuria, cough, SOB, chest pain.  She did have BC X 2 on day of admission and again on morning of discharge for fever.  These should be followed.  If positive, will contact patient for Abx administration.  She was maintained on contact and airborne precautions throughout her hospitalization.    Hyperglycemia due to type 2 diabetes mellitus Elevated in the ED, but without acidosis or ketosis.  She did well on insulin drip and then long acting insulin at home dose.  Of note, her insulin dosages  were put in wrong in the system.  She is on Lantus 30 units qhs and 5 units TIDWM.  She will be discharged on this same regimen.  She did not require and excessive amount of insulin during her hospitalization.    ESRD on dialysis  Followed by nephrology.  Will be discharged and placed in a solo room for HD until her COVID-19 test returns.  Discharge on home medications fosrenal and calcitriol and rena-vit.      Discharge Vitals:   BP 124/70 (BP Location: Left Arm)   Pulse 76    Temp 98 F (36.7 C) (Oral)   Resp 13   Ht 5\' 1"  (1.549 m)   Wt 63.3 kg   SpO2 100%   BMI 26.37 kg/m   Pertinent Labs, Studies, and Procedures:  SARS CoV negative (Cepheid), repeat pending  WBC 16 Ferritin 1217 CRP 18.6 LDH 149 D Dimer 1.22 (Well's score and Geneva score are 0) Fibrinogen 649 PCT elevated  BC X 2 pending from 7/11 [facility] X 2 pending from 7/12-7/13    Discharge Instructions: Discharge Instructions    Call MD for:  difficulty breathing, headache or visual disturbances   Complete by: As directed    Call MD for:  extreme fatigue   Complete by: As directed    Call MD for:  persistant dizziness or light-headedness   Complete by: As directed    Call MD for:  persistant nausea and vomiting   Complete by: As directed    Call MD for:  severe uncontrolled pain   Complete by: As directed    Call MD for:  temperature >100.4   Complete by: As directed    Diet - low sodium heart healthy   Complete by: As directed    Discharge instructions   Complete by: As directed    Please continue your insulin as you have been taking it at home.  If your blood sugars become very high or you become ill, please come back to the hospital or call your doctor.   There is concern that you had COVID-19 given your fevers and other blood work.  You have a test which will be available in 2-3 days to test for this virus.  In the meantime, you should quarantine from others.   You can be with others after  --3 days with no fever and --Respiratory symptoms have improved (e.g. cough, shortness of breath) and 10 days since symptoms first appeared  Depending on your healthcare provider's advice and availability of testing, you might get tested to see if you still have COVID-19. If you will be tested, you can be around others when you have no fever, respiratory symptoms have improved, and you receive two negative test results in a row, at least 24 hours apart.  Your second test will be available  in 2-3 days.   Increase activity slowly   Complete by: As directed       Signed: Sid Falcon, MD 02/23/2019, 3:31 PM

## 2019-02-23 NOTE — Progress Notes (Signed)
Renal Navigator spoke with patient's RN who states patient has had a send off COVID 19 test completed. Patient can be discharged and Renal Navigator will watch for test result and will notify OP HD clinic[facility] of test result when it is back. Patient will treat in isolation room at OP HD clinic until result is back. If negative, she will resume her regular seat at [place] clinic. If positive, she will be transferred to [facility] clinic COVID shift and her clinic will arrange this and discuss it with her at that time.  Alphonzo Cruise, [place] Renal Navigator [phone]

## 2019-02-23 NOTE — Progress Notes (Signed)
Per OP HD clinic/[facility], patient will treat in the isolation room until further COVID confirmatory testing is back.  Renal Navigator contacted patient to inform her of plan. Patient reports, "I have been swabbed 3 times and they were all negative." She is not in disagreement with plan, but wonders how many times she needs to be tested. Renal Navigator sees only 2 results in the computer at this time, both on 02/21/19. Renal Navigator prepared patient to be swabbed again today prior to discharge per plan. She stated agreement. Renal Navigator attempted to speak with bedside RN regarding test prior to discharge today, and plan for OP HD treatment-okay to discharge from OP HD treatment standpoint after COVID send out test, but she was in with another patient at this time and requested to be called back in about 20 minutes. Renal Navigator states understanding and will call back.  Alphonzo Cruise, [place] Renal Navigator [phone]

## 2019-02-23 NOTE — Progress Notes (Signed)
Chaplain contacted patient via phone.   Patient desired prayer.  Chaplain offered ministry of support remotely and prayed for patient as well as her husband and son. "I am a Christian. I want healing," she said. Chaplain offered prayer and will be available. Rev. Tamsen Snider Pager [phone]

## 2019-02-23 NOTE — Progress Notes (Signed)
Renal Navigator notified OP HD clinic/[facility] of patient's admission and requested that her records be reviewed to determine how she will be treated at discharge regarding OP HD (regular negative clinic chair vs PUI chair) and Renal Navigator is awaiting determination from clinic staff.  Alphonzo Cruise, [place] Renal Navigator [phone]

## 2019-02-23 NOTE — Progress Notes (Addendum)
  Date: 02/23/2019  Patient name: [name]  Medical record number: [id number]  Date of birth: [birth year]   Subjective: Ms. Vensel states that she feels fine.  She has no SOB, cough, chest pain.  She thinks her room was too warm yesterday when they took her temperature and doesn't feel like she had a fever.   Objective:  Vital signs in last 24 hours: Vitals:   02/22/19 2300 02/23/19 0300 02/23/19 0818 02/23/19 1159  BP: (!) 148/75 106/64 104/64 124/70  Pulse: 85 78 79 76  Resp: 20 18 13 13   Temp: 98.7 F (37.1 C) 98.1 F (36.7 C) 98.5 F (36.9 C) 98 F (36.7 C)  TempSrc: Oral Oral Oral Oral  SpO2: 100% 99% 96% 100%  Weight:      Height:       General: Awake, alert, no distress Eyes: Slightly discordant gaze CV: Normal Rate, pulses intact in radial and DP bilaterally Pulm: CTAB, no wheezing or rales MSK: decreased muscle tone for age  CXR from admission reviewed, no infiltrates or edema  Assessment/Plan:  Suspected Covid-19 Virus Infection - She has all of the elevated inflammatory markers, fever overnight - BC X 2 sent for fever, initial BC are negative to date, follow - SARS Cepheid test negative X 2 (initial was not a good sample per report).  Will send a [facility] SARS test for completion.  TAT is 2-3 days - Continue supportive care, she is not requiring oxygen - Inflammatory markers are stable from repeat yesterday.   Hyperglycemia due to type 2 diabetes mellitus - CBG remains 200-300, she had a mildly elevated AG, but CO2 is normal from yesterdays labs.  She is asymptomatic - Return to home regimen on discharge (she received 30 units of Lantus 2 days ago, then has been on sliding scale).  - Has only received 7 units of insulin in last 24 hours - I think her home medications were entered incorrectly.  Based on chart review and discussion with team, she should be on long acting 30 units and 5 units TID with meals.      ESRD on dialysis (HD TTS) - OP HD is  planned on a PUI schedule based on review of chart, I also discussed with Dr. Moshe Cipro by phone.  - Discharge for OP dialysis tomorrow.   Dispo: Anticipated discharge today  Sid Falcon, MD 02/23/2019, 12:15 PM

## 2019-02-23 NOTE — Progress Notes (Signed)
Subjective:  HD yest - removed 1100- did have low BP but rebounded.  Tmax last night 101.5.  Wanted to go home yesterday- on room air- trying to clarify OP HD arrangements.  I think she should be PUI-  was a confirmatory test done that we are waiting on    Objective Vital signs in last 24 hours: Vitals:   02/22/19 2121 02/22/19 2300 02/23/19 0300 02/23/19 0818  BP:  (!) 148/75 106/64 104/64  Pulse:  85 78 79  Resp:  20 18 13   Temp: (!) 100.9 F (38.3 C) 98.7 F (37.1 C) 98.1 F (36.7 C) 98.5 F (36.9 C)  TempSrc: Oral Oral Oral Oral  SpO2:  100% 99% 96%  Weight:      Height:       Weight change: -0.7 kg  Intake/Output Summary (Last 24 hours) at 02/23/2019 0854 Last data filed at 02/22/2019 1645 Gross per 24 hour  Intake 240 ml  Output 1168 ml  Net -928 ml   Outpt HD:[facility]S [facility] 3h 57min 64kg 2/2 bath P2 Hep 4000 R AVF - darbe 25 ug q4wks, last 6/23 - hect 8 ug - venofer 50 /wk   Assessment/ Plan: Pt is a 38 y.o. yo female with ESRD who was admitted on 02/21/2019 with fevers and body aches-  in covid isolation after 2 negative tests   Assessment/Plan: 1. Fevers-  No source id'd- neg covid tests times 2- supportive care  2. ESRD - normally TTS-  Got 7/12.  Would not be due again until tomorrow- trying to get her to an OP situation- this should not be a problem  3. Anemia- not an issue, no meds  4. Secondary hyperparathyroidism- cont calcitriol and fosrenol  5. HTN/volume- BP fine, no meds -  Is actually under EDW  Louis Meckel    Labs: Basic Metabolic Panel: Recent Labs  Lab 02/21/19 0829 02/21/19 1342 02/22/19 0622  NA 128* 135 132*  K 4.6 4.2 4.0  CL 87* 94* 89*  CO2 23 25 25   GLUCOSE 675* 109* 107*  BUN 54* 56* 63*  CREATININE 11.50* 11.80* 12.92*  CALCIUM 8.9 9.6 9.5   Liver Function Tests: Recent Labs  Lab 02/21/19 0829  AST 17  ALT 14  ALKPHOS 113  BILITOT 0.6  PROT 6.9  ALBUMIN 2.8*   No results for input(s): LIPASE,  AMYLASE in the last 168 hours. No results for input(s): AMMONIA in the last 168 hours. CBC: Recent Labs  Lab 02/21/19 0829 02/21/19 1656  WBC 13.3* 16.3*  NEUTROABS 10.7*  --   HGB 11.0* 12.0  HCT 34.3* 37.2  MCV 89.3 87.7  PLT 152 184   Cardiac Enzymes: No results for input(s): CKTOTAL, CKMB, CKMBINDEX, TROPONINI in the last 168 hours. CBG: Recent Labs  Lab 02/22/19 0835 02/22/19 1207 02/22/19 1731 02/22/19 2128 02/23/19 0814  GLUCAP 94 200* 157* 360* 249*    Iron Studies:  Recent Labs    02/22/19 0622  FERRITIN 1,217*   Studies/Results: Dg Chest 1 View  Result Date: 02/21/2019 CLINICAL DATA:  Fever.  Dialysis patient. EXAM: CHEST  1 VIEW COMPARISON:  07/24/2017 FINDINGS: Both lungs are clear. Negative for a pneumothorax. Heart and mediastinum are within normal limits. Bone structures are unremarkable. IMPRESSION: No acute cardiopulmonary disease. Electronically Signed   By: Markus Daft M.D.   On: 02/21/2019 11:33   Medications: Infusions:   Scheduled Medications: . calcitRIOL  0.25 mcg Oral Daily  . Chlorhexidine Gluconate Cloth  6 each  Topical Q0600  . heparin  5,000 Units Subcutaneous Q8H  . insulin aspart  0-9 Units Subcutaneous TID WC  . lanthanum  1,000 mg Oral TID WC  . multivitamin  1 tablet Oral Daily  . sodium chloride flush  3 mL Intravenous Q12H    have reviewed scheduled and prn medications.  Physical Exam:   Due to being in COVID isolation, PE not done to preserve PPE and limit exposure to more providers and patients   02/23/2019,8:54 AM  LOS: 0 days

## 2019-02-23 NOTE — Progress Notes (Signed)
Inpatient Diabetes Program Recommendations  AACE/ADA: New Consensus Statement on Inpatient Glycemic Control (2015)  Target Ranges:  Prepandial:   less than 140 mg/dL      Peak postprandial:   less than 180 mg/dL (1-2 hours)      Critically ill patients:  140 - 180 mg/dL   Lab Results  Component Value Date   GLUCAP 272 (H) 02/23/2019    Review of Glycemic Control Results for [name], [name] (MRN [id number]) as of 02/23/2019 12:36  Ref. Range 02/23/2019 08:14 02/23/2019 11:57  Glucose-Capillary Latest Ref Range: 70 - 99 mg/dL 249 (H) 272 (H)   Diabetes history: Type 2 DM Outpatient Diabetes medications: Lantus 5 units QHS, Novolog 30 units TID Current orders for Inpatient glycemic control: Novolog 0-9 units TID  Inpatient Diabetes Program Recommendations:    Noted inpatient trends are increasing and AM FSBG 249 mg/dL.   If to remain inpatient, consider:  - Adding Lantus 18 units QD - Novolog 3 units TID (assuming pt is consuming >50% of meal) - No notation on hCG resulted- 16.8 for possible pregnancy?  Thanks, Bronson Curb, MSN, RNC-OB Diabetes Coordinator [phone] (8a-5p)

## 2019-02-23 NOTE — Discharge Instructions (Signed)
Fever, Adult     A fever is an increase in your body's temperature. It often means a temperature of 100.4F (38C) or higher. Brief mild or moderate fevers often have no long-term effects. They often do not need treatment. Moderate or high fevers may make you feel uncomfortable. Sometimes, they can be a sign of a serious illness or disease. A fever that keeps coming back or that lasts a long time may cause you to lose water in your body (get dehydrated). You can take your temperature with a thermometer to see if you have a fever. Temperature can change with:  Age.  Time of day.  Where the thermometer is put in the body. Readings may vary when the thermometer is put: ? In the mouth (oral). ? In the butt (rectal). ? In the ear (tympanic). ? Under the arm (axillary). ? On the forehead (temporal). Follow these instructions at home: Medicines  Take over-the-counter and prescription medicines only as told by your doctor. Follow the dosing instructions carefully.  If you were prescribed an antibiotic medicine, take it as told by your doctor. Do not stop taking it even if you start to feel better. General instructions  Watch for any changes in your symptoms. Tell your doctor about them.  Rest as needed.  Drink enough fluid to keep your pee (urine) pale yellow.  Sponge yourself or bathe with room-temperature water as needed. This helps to lower your body temperature. Do not use ice water.  Do not use too many blankets or wear clothes that are too heavy.  If your fever was caused by an infection that spreads from person to person (is contagious), such as a cold or the flu: ? You should stay home from work and public places for at least 24 hours after your fever is gone. ? Your fever should be gone for at least 24 hours without the need to use medicines. Contact a doctor if:  You throw up (vomit).  You cannot eat or drink without throwing up.  You have watery poop (diarrhea).  It  hurts when you pee.  Your symptoms do not get better with treatment.  You have new symptoms.  You feel very weak. Get help right away if:  You are short of breath or have trouble breathing.  You are dizzy or you pass out (faint).  You feel mixed up (confused).  You have signs of not having enough water in your body, such as: ? Dark pee, very little pee, or no pee. ? Cracked lips. ? Dry mouth. ? Sunken eyes. ? Sleepiness. ? Weakness.  You have very bad pain in your belly (abdomen).  You keep throwing up or having watery poop.  You have a rash on your skin.  Your symptoms get worse all of a sudden. Summary  A fever is an increase in your body's temperature. It often means a temperature of 100.4F (38C) or higher.  Watch for any changes in your symptoms. Tell your doctor about them.  Take all medicines only as told by your doctor.  Do not go to work or other public places if your fever was caused by an illness that can spread to other people.  Get help right away if you have signs that you do not have enough water in your body. This information is not intended to replace advice given to you by your health care provider. Make sure you discuss any questions you have with your health care provider. Document Released: 05/08/2008   Document Revised: 01/13/2018 Document Reviewed: 01/13/2018 Elsevier Patient Education  2020 Elsevier Inc.  

## 2019-02-24 ENCOUNTER — Telehealth: Payer: Self-pay

## 2019-02-24 NOTE — Telephone Encounter (Signed)
Left message for pt to call office to arrange testing for Covid before procedure. Her swab on the 11th will not be within 7 days of surgery.   York Cerise, CMA

## 2019-02-25 ENCOUNTER — Telehealth (HOSPITAL_COMMUNITY): Payer: Self-pay

## 2019-02-26 LAB — NOVEL CORONAVIRUS, NAA (HOSP ORDER, SEND-OUT TO REF LAB; TAT 18-24 HRS): SARS-CoV-2, NAA: NOT DETECTED

## 2019-02-26 LAB — CULTURE, BLOOD (ROUTINE X 2)
Culture: NO GROWTH
Culture: NO GROWTH
Special Requests: ADEQUATE
Special Requests: ADEQUATE

## 2019-02-27 ENCOUNTER — Other Ambulatory Visit (HOSPITAL_COMMUNITY): Payer: Medicaid Other

## 2019-02-27 LAB — CULTURE, BLOOD (ROUTINE X 2): Culture: NO GROWTH

## 2019-02-28 LAB — CULTURE, BLOOD (ROUTINE X 2): Culture: NO GROWTH

## 2019-03-10 ENCOUNTER — Ambulatory Visit: Payer: Medicaid Other | Attending: Family Medicine | Admitting: Family Medicine

## 2019-03-10 ENCOUNTER — Other Ambulatory Visit: Payer: Self-pay

## 2019-03-10 DIAGNOSIS — Z992 Dependence on renal dialysis: Secondary | ICD-10-CM

## 2019-03-10 DIAGNOSIS — E1122 Type 2 diabetes mellitus with diabetic chronic kidney disease: Secondary | ICD-10-CM | POA: Diagnosis not present

## 2019-03-10 DIAGNOSIS — N186 End stage renal disease: Secondary | ICD-10-CM

## 2019-03-10 DIAGNOSIS — Z794 Long term (current) use of insulin: Secondary | ICD-10-CM | POA: Diagnosis not present

## 2019-03-10 MED ORDER — ACCU-CHEK AVIVA VI STRP: ORAL_STRIP | 12 refills | Status: DC

## 2019-03-10 MED ORDER — ACCU-CHEK AVIVA DEVI: 0 refills | Status: DC

## 2019-03-10 NOTE — Progress Notes (Signed)
Patient has been called and DOB has been verified. Patient has been screened and transferred to PCP to start phone visit.     
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
## 2019-03-11 ENCOUNTER — Other Ambulatory Visit: Payer: Self-pay | Admitting: *Deleted

## 2019-03-26 ENCOUNTER — Other Ambulatory Visit (HOSPITAL_COMMUNITY): Payer: Medicaid Other

## 2019-03-27 ENCOUNTER — Other Ambulatory Visit: Payer: Self-pay

## 2019-03-27 ENCOUNTER — Encounter (HOSPITAL_COMMUNITY): Payer: Self-pay | Admitting: Certified Registered Nurse Anesthetist

## 2019-03-27 ENCOUNTER — Encounter (HOSPITAL_COMMUNITY): Payer: Self-pay | Admitting: *Deleted
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
## 2019-03-30 ENCOUNTER — Encounter (HOSPITAL_COMMUNITY)
Admission: RE | Disposition: A | Discharge status: Home / Self Care | Payer: Self-pay | Source: Home / Self Care | Attending: Vascular Surgery

## 2019-03-30 ENCOUNTER — Other Ambulatory Visit: Payer: Self-pay

## 2019-03-30 ENCOUNTER — Encounter (HOSPITAL_COMMUNITY): Payer: Self-pay

## 2019-03-30 ENCOUNTER — Ambulatory Visit (HOSPITAL_COMMUNITY)
Admission: RE | Admit: 2019-03-30 | Discharge: 2019-03-30 | Disposition: A | Discharge status: Home / Self Care | Payer: Medicaid Other | Attending: Vascular Surgery | Admitting: Vascular Surgery

## 2019-03-30 DIAGNOSIS — Z20828 Contact with and (suspected) exposure to other viral communicable diseases: Secondary | ICD-10-CM | POA: Insufficient documentation

## 2019-03-30 DIAGNOSIS — N186 End stage renal disease: Secondary | ICD-10-CM | POA: Insufficient documentation

## 2019-03-30 DIAGNOSIS — E1151 Type 2 diabetes mellitus with diabetic peripheral angiopathy without gangrene: Secondary | ICD-10-CM | POA: Insufficient documentation

## 2019-03-30 DIAGNOSIS — E1122 Type 2 diabetes mellitus with diabetic chronic kidney disease: Secondary | ICD-10-CM | POA: Insufficient documentation

## 2019-03-30 DIAGNOSIS — E875 Hyperkalemia: Secondary | ICD-10-CM | POA: Diagnosis not present

## 2019-03-30 DIAGNOSIS — E1165 Type 2 diabetes mellitus with hyperglycemia: Secondary | ICD-10-CM

## 2019-03-30 DIAGNOSIS — I770 Arteriovenous fistula, acquired: Secondary | ICD-10-CM | POA: Diagnosis not present

## 2019-03-30 DIAGNOSIS — Z992 Dependence on renal dialysis: Secondary | ICD-10-CM | POA: Insufficient documentation

## 2019-03-30 DIAGNOSIS — Z538 Procedure and treatment not carried out for other reasons: Secondary | ICD-10-CM | POA: Insufficient documentation

## 2019-03-30 HISTORY — DX: Anxiety disorder, unspecified: F41.9

## 2019-03-30 HISTORY — DX: Anemia, unspecified: D64.9

## 2019-03-30 HISTORY — DX: End stage renal disease: N18.6

## 2019-03-30 LAB — BASIC METABOLIC PANEL
Anion gap: 12 (ref 5–15)
BUN: 27 mg/dL — ABNORMAL HIGH (ref 6–20)
CO2: 21 mmol/L — ABNORMAL LOW (ref 22–32)
Calcium: 7.9 mg/dL — ABNORMAL LOW (ref 8.9–10.3)
Chloride: 102 mmol/L (ref 98–111)
Creatinine, Ser: 7.29 mg/dL — ABNORMAL HIGH (ref 0.44–1.00)
GFR calc Af Amer: 8 mL/min — ABNORMAL LOW (ref >60)
GFR calc non Af Amer: 7 mL/min — ABNORMAL LOW (ref >60)
Glucose, Bld: 530 mg/dL (ref 70–99)
Potassium: 5.9 mmol/L — ABNORMAL HIGH (ref 3.5–5.1)
Sodium: 135 mmol/L (ref 135–145)

## 2019-03-30 LAB — POCT I-STAT 4, (NA,K, GLUC, HGB,HCT)
Glucose, Bld: 591 mg/dL (ref 70–99)
HCT: 30 % — ABNORMAL LOW (ref 36.0–46.0)
Hemoglobin: 10.2 g/dL — ABNORMAL LOW (ref 12.0–15.0)
Potassium: 4.9 mmol/L (ref 3.5–5.1)
Sodium: 132 mmol/L — ABNORMAL LOW (ref 135–145)

## 2019-03-30 LAB — GLUCOSE, CAPILLARY
Glucose-Capillary: 554 mg/dL (ref 70–99)
Glucose-Capillary: 556 mg/dL (ref 70–99)
Glucose-Capillary: 559 mg/dL (ref 70–99)
Glucose-Capillary: 367 mg/dL — ABNORMAL HIGH (ref 70–99)
Glucose-Capillary: 154 mg/dL — ABNORMAL HIGH (ref 70–99)
Glucose-Capillary: 49 mg/dL — ABNORMAL LOW (ref 70–99)
Glucose-Capillary: 108 mg/dL — ABNORMAL HIGH (ref 70–99)
Glucose-Capillary: 101 mg/dL — ABNORMAL HIGH (ref 70–99)
Glucose-Capillary: 33 mg/dL — CL (ref 70–99)
Glucose-Capillary: 87 mg/dL (ref 70–99)
Glucose-Capillary: 87 mg/dL (ref 70–99)

## 2019-03-30 LAB — SARS CORONAVIRUS 2 BY RT PCR (HOSPITAL ORDER, PERFORMED IN ~~LOC~~ HOSPITAL LAB): SARS Coronavirus 2: NEGATIVE

## 2019-03-30 LAB — POCT PREGNANCY, URINE: Preg Test, Ur: NEGATIVE

## 2019-03-30 SURGERY — REVISON OF ARTERIOVENOUS FISTULA
Anesthesia: Monitor Anesthesia Care | Laterality: Right

## 2019-03-30 MED ORDER — INSULIN ASPART 100 UNIT/ML IV SOLN: 10.0000 [IU] | Freq: Once | INTRAVENOUS | Status: DC

## 2019-03-30 MED ORDER — CEFAZOLIN SODIUM-DEXTROSE 2-4 GM/100ML-% IV SOLN
2.0000 g | INTRAVENOUS | Status: DC
  Filled 2019-03-30: qty 100

## 2019-03-30 MED ORDER — DEXTROSE 50 % IV SOLN
INTRAVENOUS | Status: AC
  Filled 2019-03-30: qty 50

## 2019-03-30 MED ORDER — CHLORHEXIDINE GLUCONATE 4 % EX LIQD: 60.0000 mL | Freq: Once | CUTANEOUS | Status: DC

## 2019-03-30 MED ORDER — INSULIN ASPART 100 UNIT/ML ~~LOC~~ SOLN
SUBCUTANEOUS | Status: AC
  Filled 2019-03-30: qty 1

## 2019-03-30 MED ORDER — DEXTROSE 50 % IV SOLN
INTRAVENOUS | Status: AC
  Administered 2019-03-30: 50 mL via INTRAVENOUS
  Filled 2019-03-30: qty 50

## 2019-03-30 MED ORDER — INSULIN ASPART 100 UNIT/ML ~~LOC~~ SOLN
SUBCUTANEOUS | Status: AC
  Administered 2019-03-30: 15 [IU] via SUBCUTANEOUS
  Filled 2019-03-30: qty 1

## 2019-03-30 MED ORDER — INSULIN ASPART 100 UNIT/ML ~~LOC~~ SOLN
15.0000 [IU] | Freq: Once | SUBCUTANEOUS | Status: AC
  Administered 2019-03-30: 09:00:00 15 [IU] via SUBCUTANEOUS

## 2019-03-30 MED ORDER — SODIUM CHLORIDE 0.9 % IV SOLN
INTRAVENOUS | Status: DC
  Administered 2019-03-30: 08:00:00 via INTRAVENOUS

## 2019-03-30 MED ORDER — DEXTROSE 50 % IV SOLN
50.0000 mL | Freq: Once | INTRAVENOUS | Status: AC
  Administered 2019-03-30: 50 mL via INTRAVENOUS
  Filled 2019-03-30: qty 50

## 2019-03-30 MED ORDER — INSULIN ASPART 100 UNIT/ML ~~LOC~~ SOLN
10.0000 [IU] | Freq: Once | SUBCUTANEOUS | Status: AC
  Administered 2019-03-30: 11:00:00 10 [IU] via SUBCUTANEOUS

## 2019-03-30 MED ORDER — DEXTROSE 50 % IV SOLN
50.0000 mL | Freq: Once | INTRAVENOUS | Status: AC
  Administered 2019-03-30: 13:00:00 50 mL via INTRAVENOUS
  Filled 2019-03-30: qty 50

## 2019-03-30 MED ORDER — SODIUM CHLORIDE 0.9 % IV SOLN: INTRAVENOUS | Status: DC

## 2019-03-30 NOTE — Progress Notes (Signed)
10 U of Insulin Novolog administered [name] for blood sugar of 367 per MD verbal order. Pt refuses to be admitted to the hospital today .Says  She has to go home to take care of her baby. Surgery has been cancelled for today. Dr Ola Spurr gave verbal orders to continue monitoring pts blood sugar levels and treat accordingly ,before patient leaves for home.
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
## 2019-03-30 NOTE — Progress Notes (Signed)
Dr. Therisa Doyne made aware of patient's elevated CBG, no orders given. Dr. Scot Dock spoke with patient. Plan of care to be determined, will continue to monitor.

## 2019-03-30 NOTE — Progress Notes (Signed)
Paged by Anesthesiologist at [facility] regarding this patient.  Glucose 591 mg/dl this AM and pt scheduled for surgery.  Recommended to MD to reach out to the Hospitalist team for management of pt's hyperglycemia.  Also recommended MD place orders for full BMET this AM to make sure pt not in DKA.  Anesthesiologist to get in touch with the Hospitalist team this AM.  Alerted DM Coordinator team at [facility] about this patient as well.    --Will follow patient during hospitalization--  Wyn Quaker RN, MSN, CDE Diabetes Coordinator Inpatient Glycemic Control Team Team Pager: [phone] (8a-5p)

## 2019-03-30 NOTE — Progress Notes (Signed)
Dr. Ola Spurr made aware of CBG 556, Verbal orders given, will continue to monitor.Re-check CBG in 30 minutes per Dr. Therisa Doyne.

## 2019-03-30 NOTE — Progress Notes (Signed)
Patient's blood sugar 87.  Patient requesting more food.  Patient given peanut butter and crackers and a ginger ale.  Will continue to monitor and will recheck blood sugar at 1500.

## 2019-03-30 NOTE — Progress Notes (Signed)
CRITICAL VALUE ALERT  Critical Value:  Glucose 530  Date & Time Notied:  03/30/2019@0947   Provider Notified: Dr. Ola Spurr and Dr. Scot Dock already aware  Orders Received/Actions taken: n/a

## 2019-03-30 NOTE — Progress Notes (Signed)
Repeat CBG= 33. Verbal orders to give 1 amp Dextrose given. Will recheck in 15 min.

## 2019-03-30 NOTE — Progress Notes (Addendum)
CBG=49. Verbal orders to give 1 amp of Dextrose given by MD.  Repeat CBG=108.  Will continue to monitor.

## 2019-03-30 NOTE — Progress Notes (Signed)
Rechecked blood sugar at 1500. Patients CBG 87. Informed MD about patients CBG and he decided we could proceed with allowing patient to leave AMA now since her sugars are in normal range. AMA paperwork signed. Patient will be discharged.

## 2019-04-15 ENCOUNTER — Other Ambulatory Visit: Payer: Self-pay | Admitting: Pharmacist

## 2019-04-15 ENCOUNTER — Other Ambulatory Visit: Payer: Self-pay

## 2019-04-15 ENCOUNTER — Ambulatory Visit: Payer: Medicaid Other | Attending: Family Medicine | Admitting: Family Medicine

## 2019-04-15 DIAGNOSIS — Z794 Long term (current) use of insulin: Secondary | ICD-10-CM | POA: Diagnosis not present

## 2019-04-15 DIAGNOSIS — E1122 Type 2 diabetes mellitus with diabetic chronic kidney disease: Secondary | ICD-10-CM | POA: Diagnosis not present

## 2019-04-15 DIAGNOSIS — N186 End stage renal disease: Secondary | ICD-10-CM

## 2019-04-15 DIAGNOSIS — Z992 Dependence on renal dialysis: Secondary | ICD-10-CM | POA: Diagnosis not present

## 2019-04-15 MED ORDER — INSULIN ASPART 100 UNIT/ML ~~LOC~~ SOLN: 5.0000 [IU] | Freq: Three times a day (TID) | SUBCUTANEOUS | 6 refills | Status: DC

## 2019-04-15 MED ORDER — INSULIN GLARGINE 100 UNIT/ML ~~LOC~~ SOLN: 30.0000 [IU] | Freq: Every day | SUBCUTANEOUS | 6 refills | Status: DC

## 2019-04-15 MED ORDER — ACCU-CHEK GUIDE VI STRP: ORAL_STRIP | 12 refills | Status: DC

## 2019-04-15 MED ORDER — ACCU-CHEK GUIDE ME W/DEVICE KIT: 1.0000 | PACK | Freq: Three times a day (TID) | 0 refills | Status: DC

## 2019-04-15 MED ORDER — ACCU-CHEK FASTCLIX LANCETS MISC: 11 refills | Status: DC

## 2019-04-15 NOTE — Progress Notes (Signed)
Patient has been called and DOB has been verified. Patient has been screened and transferred to PCP to start phone visit.  Patient needs refill on insulins and pen needles.

## 2019-04-15 NOTE — Progress Notes (Signed)
Virtual Visit via Telephone Note  I connected with [name], on 04/15/2019 at 11:13 AM by telephone due to the COVID-19 pandemic and verified that I am speaking with the correct person using two identifiers.   Consent: I discussed the limitations, risks, security and privacy concerns of performing an evaluation and management service by telephone and the availability of in person appointments. I also discussed with the patient that there may be a patient responsible charge related to this service. The patient expressed understanding and agreed to proceed.   Location of Patient: Home  Location of Provider: Clinic   Persons participating in Telemedicine visit: Joley Ghali Farrington-CMA Dr. Felecia Shelling     History of Present Illness: 38 year old female with a history of end-stage renal disease on hemodialysis Tuesdays, Thursdays and Fridays, type 2 diabetes who presents today for follow-up visit. She was supposed to come in for an in person visit today so she could have labs including an A1c however this was scheduled as a telehealth visit.  She has been unable to obtain her Lantus and NovoLog despite the fact that at discharge from hospitalization last month she was written a prescription with 11 refills.  She informs me she has not been on insulin for the last 2 years and has not been able to check her blood sugars even though elevated I wrote a prescription for glucometer last month for her.  States the pharmacy did not have one for her. Her blood sugars have been checked at her hemodialysis sessions and they are usually a little above 200 prior to hemodialysis. She has no additional concerns today and denies chest pain, dyspnea and hemodialysis sessions are going well.   Past Medical History:  Diagnosis Date  . Anemia   . Anxiety   . Diabetes mellitus without complication ([place])   . Dialysis patient ([place])   . ESRD (end stage renal disease) ([place])    Dialysis T/Th/Sa    Allergies  Allergen Reactions  . Peanut-Containing Drug Products Anaphylaxis and Hives  . Chocolate Hives  . Morphine Rash    Pt denies this allergy    Current Outpatient Medications on File Prior to Visit  Medication Sig Dispense Refill  . insulin aspart (NOVOLOG) 100 UNIT/ML injection Inject 5 Units into the skin 3 (three) times daily with meals. 10 mL 11  . insulin glargine (LANTUS) 100 UNIT/ML injection Inject 0.3 mLs (30 Units total) into the skin at bedtime. 10 mL 11  . Blood Glucose Monitoring Suppl (ACCU-CHEK AVIVA) device Use as instructed 3 times daily before meals (Patient not taking: Reported on 04/15/2019) 1 each 0  . calcitRIOL (ROCALTROL) 0.25 MCG capsule Take 0.25 mcg by mouth daily.     Marland Kitchen FOSRENOL 1000 MG PACK Take 1,000 mg by mouth 3 (three) times daily with meals.     Marland Kitchen glucose blood (ACCU-CHEK AVIVA) test strip Use 3 times daily before meals (Patient not taking: Reported on 04/15/2019) 100 each 12  . multivitamin (RENA-VIT) TABS tablet Take 1 tablet by mouth daily.      No current facility-administered medications on file prior to visit.     Observations/Objective: Awake, alert, oriented x3 Not in acute distress  Assessment and Plan: 1. Type 2 diabetes mellitus with chronic kidney disease on chronic dialysis, with long-term current use of insulin (HCC) Uncontrolled No A1c on file She was supposed to have an in person visit today so labs can be drawn Advised to come into the clinic in person at next  visit for labs Noncompliant with medications for the last 3 years which is surprising especially since she was given a prescription last month at discharge I have refilled her medications again and if fasting sugars are in the 200 range she is to administer the 20 units of Lantus at bedtime and if fasting sugars in the 300 range will administer 30 units We will review blood sugar log at next visit and determine if she needs to commence NovoLog Counseled on Diabetic  diet, my plate method, [id number] minutes of moderate intensity exercise/week Keep blood sugar logs with fasting goals of 80-120 mg/dl, random of less than 180 and in the event of sugars less than 60 mg/dl or greater than 400 mg/dl please notify the clinic ASAP. It is recommended that you undergo annual eye exams and annual foot exams. Pneumonia vaccine is recommended. - insulin glargine (LANTUS) 100 UNIT/ML injection; Inject 0.3 mLs (30 Units total) into the skin at bedtime.  Dispense: 10 mL; Refill: 6 - insulin aspart (NOVOLOG) 100 UNIT/ML injection; Inject 5 Units into the skin 3 (three) times daily with meals.  Dispense: 10 mL; Refill: 6  2. ESRD on dialysis [facility]) Continue hemodialysis as per schedule   Follow Up Instructions: Return in about 3 weeks (around 05/06/2019) for in person visit and labs.    I discussed the assessment and treatment plan with the patient. The patient was provided an opportunity to ask questions and all were answered. The patient agreed with the plan and demonstrated an understanding of the instructions.   The patient was advised to call back or seek an in-person evaluation if the symptoms worsen or if the condition fails to improve as anticipated.     I provided 11 minutes total of non-face-to-face time during this encounter including median intraservice time, reviewing previous notes, labs, imaging, medications, management and patient verbalized understanding.     Charlott Rakes, MD, FAAFP. [facility] [place], [place]   04/15/2019, 11:13 AM

## 2019-04-16 ENCOUNTER — Other Ambulatory Visit: Payer: Self-pay

## 2019-04-16 DIAGNOSIS — E1122 Type 2 diabetes mellitus with diabetic chronic kidney disease: Secondary | ICD-10-CM

## 2019-04-16 MED ORDER — PEN NEEDLES 30G X 5 MM MISC: 1.0000 | Freq: Three times a day (TID) | 3 refills | Status: DC

## 2019-04-16 MED ORDER — INSULIN SYRINGES (DISPOSABLE) U-100 0.5 ML MISC: 3 refills | Status: DC

## 2019-04-22 ENCOUNTER — Ambulatory Visit: Payer: Medicaid Other | Admitting: Pharmacist

## 2019-05-11 ENCOUNTER — Ambulatory Visit: Payer: Medicaid Other | Admitting: Family Medicine

## 2019-05-20 ENCOUNTER — Ambulatory Visit: Payer: Medicaid Other

## 2019-06-03 ENCOUNTER — Ambulatory Visit: Payer: Medicaid Other

## 2019-06-18 ENCOUNTER — Ambulatory Visit: Payer: Medicaid Other | Admitting: Family Medicine

## 2019-07-17 ENCOUNTER — Ambulatory Visit: Payer: Medicaid Other | Attending: Family Medicine | Admitting: Family Medicine

## 2019-07-17 ENCOUNTER — Other Ambulatory Visit: Payer: Self-pay

## 2019-07-17 ENCOUNTER — Encounter: Payer: Self-pay | Admitting: Family Medicine

## 2019-07-17 VITALS — BP 146/86 | HR 86 | Temp 98.2°F | Resp 18 | Ht 61.0 in | Wt 156.0 lb

## 2019-07-17 DIAGNOSIS — Z89422 Acquired absence of other left toe(s): Secondary | ICD-10-CM

## 2019-07-17 DIAGNOSIS — D631 Anemia in chronic kidney disease: Secondary | ICD-10-CM | POA: Diagnosis not present

## 2019-07-17 DIAGNOSIS — R108A3 Suprapubic tenderness: Secondary | ICD-10-CM

## 2019-07-17 DIAGNOSIS — Z992 Dependence on renal dialysis: Secondary | ICD-10-CM

## 2019-07-17 DIAGNOSIS — E1122 Type 2 diabetes mellitus with diabetic chronic kidney disease: Secondary | ICD-10-CM

## 2019-07-17 DIAGNOSIS — N186 End stage renal disease: Secondary | ICD-10-CM

## 2019-07-17 DIAGNOSIS — Z794 Long term (current) use of insulin: Secondary | ICD-10-CM

## 2019-07-17 DIAGNOSIS — R10819 Abdominal tenderness, unspecified site: Secondary | ICD-10-CM | POA: Diagnosis not present

## 2019-07-17 DIAGNOSIS — I10 Essential (primary) hypertension: Secondary | ICD-10-CM

## 2019-07-17 DIAGNOSIS — S98132A Complete traumatic amputation of one left lesser toe, initial encounter: Secondary | ICD-10-CM

## 2019-07-17 LAB — POCT GLYCOSYLATED HEMOGLOBIN (HGB A1C): Hemoglobin A1C: 11.3 % — AB (ref 4.0–5.6)

## 2019-07-17 LAB — GLUCOSE, POCT (MANUAL RESULT ENTRY): POC Glucose: 454 mg/dL — AB (ref 70–99)

## 2019-07-17 MED ORDER — INSULIN GLARGINE 100 UNIT/ML ~~LOC~~ SOLN: SUBCUTANEOUS | 6 refills | Status: DC

## 2019-07-17 MED ORDER — ACCU-CHEK GUIDE VI STRP: ORAL_STRIP | 12 refills | Status: DC

## 2019-07-17 MED ORDER — PEN NEEDLES 30G X 5 MM MISC: 1.0000 | Freq: Three times a day (TID) | 11 refills | Status: DC

## 2019-07-17 MED ORDER — INSULIN ASPART 100 UNIT/ML ~~LOC~~ SOLN: 5.0000 [IU] | Freq: Three times a day (TID) | SUBCUTANEOUS | 6 refills | Status: DC

## 2019-07-17 NOTE — Progress Notes (Signed)
Subjective:  Patient ID: [name], female    DOB: [birth year]  Age: 38 y.o. MRN: [id number]  CC: Diabetes   HPI [name], 38 yo female, who presents for follow-up of Type 2 DM after her telephone visit on 04/15/2019 with Dr. Margarita Walker.  She reports that at the time of her last visit she had been out of all of her medication for treatment of diabetes. (Patient is status post ED visit on 03/30/2019, after presenting for right brachiocephalic atrial venous fistula however patient's blood sugar was elevated at 591 and her surgery was canceled.).  Patient also has end-stage renal disease and attends dialysis on Tuesday Thursday and Saturday.  She reports that she is now obtained a glucometer and her blood sugars are generally less than 120 in the mornings when she is fasting and she sometimes feels as if her blood sugars are low.  She also tends to have a drop in blood sugar during dialysis and she states that her blood sugar has monitored there and she is given crackers/food if needed for low blood sugar.  Blood sugars later in the day are in the low 200s.  She currently takes her insulin before bedtime.  She does have some occasional urinary frequency, she reports that she continues to make urine.  She denies any dysuria/burning with urination.  She denies any current issues with abdominal pain, no nausea/vomiting/diarrhea or constipation.  She denies any issues with chest pain or palpitations.  No recent fever or chills, no shortness of breath or cough.  She does not have any peripheral edema.  She reports a past history of having to have an amputation of her second toe on her left foot.  She reports that she is been unable to obtain diabetic shoes in the past as she could not find shoes that fit well.  She is not sure she has seen a podiatrist in the past.  She did eat prior to today's visit and she reports that she did take her Lantus last night.  Past Medical History:  Diagnosis Date  . Anemia   .  Anxiety   . Diabetes mellitus without complication ([place])   . Dialysis patient ([place])   . ESRD (end stage renal disease) [facility])    Dialysis T/Th/Sa    Past Surgical History:  Procedure Laterality Date  . AMPUTATION TOE Left   . AV FISTULA PLACEMENT      Family History  Problem Relation Age of Onset  . Diabetes Mother   . Diabetes Father     Social History   Tobacco Use  . Smoking status: Never Smoker  . Smokeless tobacco: Never Used  Substance Use Topics  . Alcohol use: No    Frequency: Never    ROS Review of Systems  Constitutional: Positive for fatigue. Negative for chills and fever.  HENT: Negative for sore throat and trouble swallowing.   Eyes: Negative for photophobia and visual disturbance.  Respiratory: Negative for cough and shortness of breath.   Cardiovascular: Positive for leg swelling (occasional). Negative for chest pain and palpitations.  Gastrointestinal: Negative for abdominal pain, blood in stool, constipation, diarrhea and nausea.  Endocrine: Positive for polydipsia and polyuria. Negative for cold intolerance and heat intolerance.  Genitourinary: Positive for frequency. Negative for dysuria.  Musculoskeletal: Positive for back pain (occasional low back pain). Negative for arthralgias.  Neurological: Positive for numbness. Negative for dizziness and headaches.  Hematological: Negative for adenopathy. Does not bruise/bleed easily.  Psychiatric/Behavioral: Negative for self-injury  and suicidal ideas. The patient is nervous/anxious.     Objective:   Today's Vitals: BP (!) 146/86 (BP Location: Left Arm, Patient Position: Sitting, Cuff Size: Normal)   Pulse 86   Temp 98.2 F (36.8 C) (Oral)   Resp 18   Ht '5\' 1"'  (1.549 m)   Wt 156 lb (70.8 kg)   LMP 06/28/2019   SpO2 100%   BMI 29.48 kg/m   Physical Exam Vitals signs reviewed.  Constitutional:      General: She is not in acute distress.    Appearance: Normal appearance.  HENT:     Right Ear:  Tympanic membrane normal.     Left Ear: Tympanic membrane normal.     Nose: Congestion (mild edema of nasal turbnates) present. No rhinorrhea.     Mouth/Throat:     Mouth: Mucous membranes are moist.     Pharynx: No oropharyngeal exudate.  Eyes:     Extraocular Movements: Extraocular movements intact.     Conjunctiva/sclera: Conjunctivae normal.  Neck:     Musculoskeletal: Normal range of motion and neck supple. No muscular tenderness.     Vascular: No carotid bruit.  Cardiovascular:     Rate and Rhythm: Normal rate and regular rhythm.     Pulses:          Dorsalis pedis pulses are 1+ on the right side and 1+ on the left side.       Posterior tibial pulses are 1+ on the right side and 1+ on the left side.     Heart sounds: Murmur (soft murmur heard on exam) present.  Pulmonary:     Effort: Pulmonary effort is normal.     Breath sounds: Normal breath sounds.  Abdominal:     Palpations: Abdomen is soft.     Tenderness: There is abdominal tenderness (mild suprapubic tenderness on exam). There is no right CVA tenderness or guarding.  Musculoskeletal:        General: Deformity (deformity of left foot s/p second toe amputation) present. No swelling.     Right lower leg: No edema.     Left lower leg: No edema.     Right foot: Normal range of motion. Bunion present. No foot drop.     Left foot: Decreased range of motion. Deformity (s/p second toe amputation) present.       Feet:  Feet:     Right foot:     Protective Sensation: 10 sites tested. 10 sites sensed.     Skin integrity: Callus present. No skin breakdown or fissure.     Toenail Condition: Right toenails are long.     Left foot:     Protective Sensation: 10 sites tested. 10 sites sensed.     Skin integrity: Callus present. No skin breakdown or fissure.     Toenail Condition: Left toenails are long.  Lymphadenopathy:     Cervical: No cervical adenopathy.  Skin:    General: Skin is dry.     Findings: Lesion (Patient with  some abrasions on the top of the right foot which are healing.  She reports that these areas were done by her dog) present.  Neurological:     General: No focal deficit present.     Mental Status: She is alert and oriented to person, place, and time.     Cranial Nerves: No cranial nerve deficit.  Psychiatric:        Mood and Affect: Mood normal.  Behavior: Behavior normal.     Assessment & Plan:   1. Type 2 diabetes mellitus with chronic kidney disease on chronic dialysis, with long-term current use of insulin ([place]); 2.  Amputation of toe of left foot; 4.  End-stage renal disease on dialysis; anemia of chronic kidney disease, currently on dialysis discussed with the patient that since she tends to have higher blood sugars in the evenings and also feels as if her blood sugar drops overnight at times that she may wish to change her administration of her long-acting insulin and take this around noon/prior to lunch.  She reports that she is usually back home from dialysis by noon.  Patient should call or return if she continues to feel as if her blood sugars become low overnight and her blood sugars remain higher later in the day.  Patient's hemoglobin A1c at today's visit is 11.3 but patient has only recently restarted the use of insulin.  She is encouraged to continue to monitor her blood sugars and continue a healthy, low carbohydrate diet.  Discussed with the patient that I would like to refer her to an endocrinologist to help her get better control of her diabetes.  She will have comprehensive metabolic panel done at today's visit in follow-up of her diabetes.  Urine microalbumin/creatinine ratio was added by CMA.  I did not order this test as patient already with known end-stage renal disease for which she is on dialysis.  Patient also with history of amputation of the second toe of the left foot.  She will be referred to podiatry for further evaluation and treatment.  Attempt was made to print  out prescription for patient to receive diabetic shoes however this could not be printed but she should discuss the need for diabetic shoes with podiatry.  Refills provided of Lantus 25 units once daily and Accu-Chek guide meter test strips.  She reports that she is currently checking her blood sugar 3 times daily.  CBC in follow-up of anemia associated with end-stage renal disease.  Renal vitamins refilled.  Diabetic foot care discussed.  Referral placed for diabetic eye exam. - HgB A1c - Glucose (CBG) - POCT URINALYSIS DIP (CLINITEK) - Microalbumin/Creatinine Ratio, Urine -Comprehensive metabolic panel -Complete blood count  4.  Suprapubic tenderness;  Will obtain urinalysis and urine culture in follow-up of patient's complaint of urinary frequency which could also be related to her uncontrolled diabetes but as she also has suprapubic discomfort on examination, will evaluate for possible urinary tract infection and she will be contacted with the results and if antibiotic therapy is warranted based on those results.  5.  Essential hypertension Patient was slightly elevated blood pressure today's visit.  She reports that she is not taking blood pressure medicine at this time as her blood pressure tends to drop after dialysis.  Patient should notify the office if her blood pressure is remaining greater than 140/90.  She reports that she does have her blood pressure monitored during dialysis.  Outpatient Encounter Medications as of 07/17/2019  Medication Sig  . Accu-Chek FastClix Lancets MISC Use as instructed to check blood sugar up to TID. E11.22  . Blood Glucose Monitoring Suppl (ACCU-CHEK GUIDE ME) w/Device KIT 1 kit by Does not apply route 3 (three) times daily. Use to check BG at home up to 3 times daily. E11.22  . calcitRIOL (ROCALTROL) 0.25 MCG capsule Take 0.25 mcg by mouth daily.   Marland Kitchen FOSRENOL 1000 MG PACK Take 1,000 mg by mouth 3 (three)  times daily with meals.   Marland Kitchen glucose blood (ACCU-CHEK  GUIDE) test strip Use as instructed to check blood sugar up to TID. E11.22  . insulin aspart (NOVOLOG) 100 UNIT/ML injection Inject 5 Units into the skin 3 (three) times daily with meals.  . insulin glargine (LANTUS) 100 UNIT/ML injection Inject 0.3 mLs (30 Units total) into the skin at bedtime.  . Insulin Pen Needle (PEN NEEDLES) 30G X 5 MM MISC 1 each by Does not apply route 3 (three) times daily.  . Insulin Syringes, Disposable, U-100 0.5 ML MISC Use as directed  . multivitamin (RENA-VIT) TABS tablet Take 1 tablet by mouth daily.    No facility-administered encounter medications on file as of 07/17/2019.    An After Visit Summary was printed and given to the patient.  Follow-up: Return in about 4 weeks (around 08/14/2019) for chronic issues and as needed.   Antony Blackbird MD

## 2019-07-17 NOTE — Progress Notes (Signed)
Patient needs a refill on Multivitamin, Test strips and Lantus

## 2019-07-18 LAB — CBC
Hematocrit: 30.3 % — ABNORMAL LOW (ref 34.0–46.6)
Hemoglobin: 9.3 g/dL — ABNORMAL LOW (ref 11.1–15.9)
MCH: 28.4 pg (ref 26.6–33.0)
MCHC: 30.7 g/dL — ABNORMAL LOW (ref 31.5–35.7)
MCV: 93 fL (ref 79–97)
Platelets: 173 x10E3/uL (ref 150–450)
RBC: 3.27 x10E6/uL — ABNORMAL LOW (ref 3.77–5.28)
RDW: 13 % (ref 11.7–15.4)
WBC: 6.5 x10E3/uL (ref 3.4–10.8)

## 2019-07-18 LAB — COMPREHENSIVE METABOLIC PANEL WITH GFR
ALT: 5 IU/L (ref 0–32)
AST: 13 IU/L (ref 0–40)
Albumin/Globulin Ratio: 1 — ABNORMAL LOW (ref 1.2–2.2)
Albumin: 3.6 g/dL — ABNORMAL LOW (ref 3.8–4.8)
Alkaline Phosphatase: 117 IU/L (ref 39–117)
BUN/Creatinine Ratio: 5 — ABNORMAL LOW (ref 9–23)
BUN: 33 mg/dL — ABNORMAL HIGH (ref 6–20)
Bilirubin Total: 0.2 mg/dL (ref 0.0–1.2)
CO2: 27 mmol/L (ref 20–29)
Calcium: 9.1 mg/dL (ref 8.7–10.2)
Chloride: 87 mmol/L — ABNORMAL LOW (ref 96–106)
Creatinine, Ser: 6.31 mg/dL — ABNORMAL HIGH (ref 0.57–1.00)
GFR calc Af Amer: 9 mL/min/1.73 — ABNORMAL LOW
GFR calc non Af Amer: 8 mL/min/1.73 — ABNORMAL LOW
Globulin, Total: 3.7 g/dL (ref 1.5–4.5)
Glucose: 428 mg/dL — ABNORMAL HIGH (ref 65–99)
Potassium: 3.7 mmol/L (ref 3.5–5.2)
Sodium: 133 mmol/L — ABNORMAL LOW (ref 134–144)
Total Protein: 7.3 g/dL (ref 6.0–8.5)

## 2019-07-18 LAB — MICROALBUMIN / CREATININE URINE RATIO
Creatinine, Urine: 38.4 mg/dL
Microalb/Creat Ratio: 958 mg/g{creat} — ABNORMAL HIGH (ref 0–29)
Microalbumin, Urine: 367.8 ug/mL

## 2019-07-18 MED ORDER — RENA-VITE PO TABS: 1.0000 | ORAL_TABLET | Freq: Every day | ORAL | 3 refills | Status: DC

## 2019-07-19 LAB — URINE CULTURE: Organism ID, Bacteria: NO GROWTH

## 2019-07-20 MED FILL — RENA-VITE TABLET: 30 days supply | Qty: 30 | Fill #0

## 2019-07-22 ENCOUNTER — Telehealth: Payer: Self-pay | Admitting: *Deleted

## 2019-07-22 NOTE — Telephone Encounter (Signed)
MA UTR patient due to ringing and disconnecting with no VM option.

## 2019-07-22 NOTE — Telephone Encounter (Signed)
-----   Message from Antony Blackbird, MD sent at 07/20/2019 10:06 AM EST ----- No growth of bacteria on urine culture.  Abnormal urine creatinine microalbumin ratio is as expected as patient with end-stage renal disease requiring dialysis.  Complete metabolic panel with glucose elevated at 428, patient is to continue her use of insulin and follow-up with endocrinology but should notify the office in the meantime if her blood sugars are currently staying greater than 200.  Creatinine of 6.31 consistent with her end-stage renal disease/need for dialysis treatment.  Mild decrease in sodium and chloride.  Mild decrease in albumin, which can be associated with poor nutrition.  Complete blood count with hemoglobin of 9.3 consistent with anemia which is likely related to her renal disease.  This will continue to be monitored by nephrology.  Renal multivitamin was sent to [facility] pharmacy but can be transferred if not available.  Patient may also wish to contact her nephrology office regarding prescription for renal vitamins if not available through [place]

## 2019-07-22 NOTE — Telephone Encounter (Signed)
Patient verified DOB Patient is aware of lab results and needing to eat more nutritionally balanced meals. Patient is aware of following up with nephrology and patient is taking multivitamin. Patient to follow up as planned.

## 2019-07-23 NOTE — H&P (View-Only) (Signed)
HISTORY AND PHYSICAL     CC:  dialysis access Requesting Provider:  [facility]  HPI: This is a 38 y.o. female here for evaluation of her hemodialysis access.  She was last seen on 02/11/2019 by Dr. 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  At that time, she did get some paresthesias in her right hand.  Dr. Scot Dock recommended plication of this at that time.    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  She states that she got her "vein stretched" at the access center on July 15, 2019.  She states they referred her here because the skin over the aneurysmal areas are starting to thin.  She has not had any bleeding from these areas.   The pt is left hand dominant.   Pt is on dialysis.    If pt on Dialysis:   Dialysis days/center:  T/T/S at [facility] location  The pt is not on a statin for cholesterol management.  The pt is diabetic.   The pt is not on medication for hypertension.   Tobacco hx:  remote The pt is on a daily aspirin. Other AC:  none  Past Medical History:  Diagnosis Date  . Anemia   . Anxiety   . Diabetes mellitus without complication ([place])   . Dialysis patient ([place])   . ESRD (end stage renal disease) [facility])    Dialysis T/Th/Sa    Past Surgical History:  Procedure Laterality Date  . AMPUTATION TOE Left   . AV FISTULA PLACEMENT      Allergies  Allergen Reactions  . Peanut-Containing Drug Products Anaphylaxis and Hives  . Chocolate Hives  . Morphine Rash    Pt denies this allergy    Current Outpatient Medications  Medication Sig Dispense Refill  . Accu-Chek FastClix Lancets MISC Use as instructed to check blood sugar up to TID. E11.22 102 each 11  . Blood Glucose Monitoring Suppl (ACCU-CHEK GUIDE ME) w/Device KIT 1 kit by Does not apply route 3 (three) times daily. Use to check BG at home up to 3 times daily. E11.22 1 kit 0  . calcitRIOL (ROCALTROL) 0.25 MCG capsule Take 0.25 mcg by mouth  daily.     Marland Kitchen FOSRENOL 1000 MG PACK Take 1,000 mg by mouth 3 (three) times daily with meals.     Marland Kitchen glucose blood (ACCU-CHEK GUIDE) test strip Use as instructed to check blood sugar up to TID. E11.22 100 each 12  . insulin aspart (NOVOLOG) 100 UNIT/ML injection Inject 5 Units into the skin 3 (three) times daily with meals. 10 mL 6  . insulin glargine (LANTUS) 100 UNIT/ML injection Inject 25 units daily before or after lunch 10 mL 6  . Insulin Pen Needle (PEN NEEDLES) 30G X 5 MM MISC 1 each by Does not apply route 3 (three) times daily. 100 each 11  . Insulin Syringes, Disposable, U-100 0.5 ML MISC Use as directed 100 each 3  . multivitamin (RENA-VIT) TABS tablet Take 1 tablet by mouth daily. 90 tablet 3   No current facility-administered medications for this visit.    Family History  Problem Relation Age of Onset  . Diabetes Mother   . Diabetes Father     Social History   Socioeconomic History  . Marital status: Single    Spouse name: Not on file  . Number of children: Not on file  . Years of education: Not on file  . Highest education level:  Not on file  Occupational History  . Not on file  Tobacco Use  . Smoking status: Never Smoker  . Smokeless tobacco: Never Used  Substance and Sexual Activity  . Alcohol use: No  . Drug use: No  . Sexual activity: Not on file  Other Topics Concern  . Not on file  Social History Narrative  . Not on file   Social Determinants of Health   Financial Resource Strain:   . Difficulty of Paying Living Expenses: Not on file  Food Insecurity:   . Worried About Charity fundraiser in the Last Year: Not on file  . Ran Out of Food in the Last Year: Not on file  Transportation Needs:   . Lack of Transportation (Medical): Not on file  . Lack of Transportation (Non-Medical): Not on file  Physical Activity:   . Days of Exercise per Week: Not on file  . Minutes of Exercise per Session: Not on file  Stress:   . Feeling of Stress : Not on file    Social Connections:   . Frequency of Communication with Friends and Family: Not on file  . Frequency of Social Gatherings with Friends and Family: Not on file  . Attends Religious Services: Not on file  . Active Member of Clubs or Organizations: Not on file  . Attends Archivist Meetings: Not on file  . Marital Status: Not on file  Intimate Partner Violence:   . Fear of Current or Ex-Partner: Not on file  . Emotionally Abused: Not on file  . Physically Abused: Not on file  . Sexually Abused: Not on file     ROS: _0  Positive   _1  Negative   _2  All sytems reviewed and are negative  Cardiac: _3  chest pain/pressure _4  SOB _5  DOE  Vascular: _6  pain in legs while walking  Pulmonary: _7  asthma _8  wheezing  Neurologic: _9  hx stroke   Hematologic: _10  bleeding problems  GI _11  GERD  GU: _12  CKD/renal failure  _13  HD---_14  M/W/F _15  T/T/S  Psychiatric: _16  hx of major depression  Integumentary: _17  rashes _18  ulcers  Constitutional: _19  fever _20  chills  PHYSICAL EXAMINATION:  Today's Vitals   07/24/19 0918  BP: (!) 188/86  Pulse: 89  Resp: 14  Temp: (!) 97.4 F (36.3 C)  TempSrc: Temporal  SpO2: 100%  Weight: 155 lb (70.3 kg)  Height: _21  (1.549 m)   Body mass index is 29.29 kg/m.    General:  WDWN female in NAD Gait: Normal HENT: WNL Pulmonary: normal non-labored breathing , without Rales, rhonchi,  wheezing Cardiac: regular, without  Murmurs without carotid bruits Abdomen: soft, NT, no masses Skin: without rashes, without ulcers  Vascular Exam/Pulses:   Right Left  Radial 1+ (weak)  1+ (weak)  Ulnar Unable to palpate  Unable to palpate    Extremities:  without ischemic changes, without Gangrene, without cellulitis; without open wounds;  Fistula has +thrill and has 2 aneurysmal areas with thinning skin    Musculoskeletal: no muscle wasting or atrophy  Neurologic: A&O X 3; Moving all extremities equally;  Speech is  fluent/normal  Non-Invasive Vascular Imaging:   none  ASSESSMENT/PLAN: 38 y.o. female with ESRD here for evaluation of her RUA AVF hemodialysis access   -pt was seen by Dr. Scot Dock earlier this year and Dr. Scot Dock recommended plication of her aneurysmal area of her fistula.  Unsure why this did not get completed, but she is here for evaluation for thinning  skin over 2 aneurysmal areas (see picture above).  She states these have not bled.   Of note, she had what sounds like angioplasty of her fistula at [facility] earlier this month -discussed with pt plication of both these areas by Dr. Scot Dock early next week.  I did discuss with her if this bleeds , how to hold pressure and call 911.  She expressed understanding.   -she is not on anticoagulation   [name], PA-C [facility] [phone]  Clinic MD:   Donzetta Matters

## 2019-07-23 NOTE — Progress Notes (Signed)
HISTORY AND PHYSICAL     CC:  dialysis access Requesting Provider:  [facility]  HPI: This is a 38 y.o. female here for evaluation of her hemodialysis access.  She was last seen on 02/11/2019 by Dr. Dickson to evaluate enarlging aneurysms of the RUA AVF.  At that time, she did get some paresthesias in her right hand.  Dr. Dickson recommended plication of this at that time.    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  She states that she got her "vein stretched" at the access center on July 15, 2019.  She states they referred her here because the skin over the aneurysmal areas are starting to thin.  She has not had any bleeding from these areas.   The pt is left hand dominant.   Pt is on dialysis.    If pt on Dialysis:   Dialysis days/center:  T/T/S at [address] location  The pt is not on a statin for cholesterol management.  The pt is diabetic.   The pt is not on medication for hypertension.   Tobacco hx:  remote The pt is on a daily aspirin. Other AC:  none  Past Medical History:  Diagnosis Date  . Anemia   . Anxiety   . Diabetes mellitus without complication (HCC)   . Dialysis patient (HCC)   . ESRD (end stage renal disease) (HCC)    Dialysis T/Th/Sa    Past Surgical History:  Procedure Laterality Date  . AMPUTATION TOE Left   . AV FISTULA PLACEMENT      Allergies  Allergen Reactions  . Peanut-Containing Drug Products Anaphylaxis and Hives  . Chocolate Hives  . Morphine Rash    Pt denies this allergy    Current Outpatient Medications  Medication Sig Dispense Refill  . Accu-Chek FastClix Lancets MISC Use as instructed to check blood sugar up to TID. E11.22 102 each 11  . Blood Glucose Monitoring Suppl (ACCU-CHEK GUIDE ME) w/Device KIT 1 kit by Does not apply route 3 (three) times daily. Use to check BG at home up to 3 times daily. E11.22 1 kit 0  . calcitRIOL (ROCALTROL) 0.25 MCG capsule Take 0.25 mcg by mouth  daily.     . FOSRENOL 1000 MG PACK Take 1,000 mg by mouth 3 (three) times daily with meals.     . glucose blood (ACCU-CHEK GUIDE) test strip Use as instructed to check blood sugar up to TID. E11.22 100 each 12  . insulin aspart (NOVOLOG) 100 UNIT/ML injection Inject 5 Units into the skin 3 (three) times daily with meals. 10 mL 6  . insulin glargine (LANTUS) 100 UNIT/ML injection Inject 25 units daily before or after lunch 10 mL 6  . Insulin Pen Needle (PEN NEEDLES) 30G X 5 MM MISC 1 each by Does not apply route 3 (three) times daily. 100 each 11  . Insulin Syringes, Disposable, U-100 0.5 ML MISC Use as directed 100 each 3  . multivitamin (RENA-VIT) TABS tablet Take 1 tablet by mouth daily. 90 tablet 3   No current facility-administered medications for this visit.    Family History  Problem Relation Age of Onset  . Diabetes Mother   . Diabetes Father     Social History   Socioeconomic History  . Marital status: Single    Spouse name: Not on file  . Number of children: Not on file  . Years of education: Not on file  . Highest education level:   Not on file  Occupational History  . Not on file  Tobacco Use  . Smoking status: Never Smoker  . Smokeless tobacco: Never Used  Substance and Sexual Activity  . Alcohol use: No  . Drug use: No  . Sexual activity: Not on file  Other Topics Concern  . Not on file  Social History Narrative  . Not on file   Social Determinants of Health   Financial Resource Strain:   . Difficulty of Paying Living Expenses: Not on file  Food Insecurity:   . Worried About Running Out of Food in the Last Year: Not on file  . Ran Out of Food in the Last Year: Not on file  Transportation Needs:   . Lack of Transportation (Medical): Not on file  . Lack of Transportation (Non-Medical): Not on file  Physical Activity:   . Days of Exercise per Week: Not on file  . Minutes of Exercise per Session: Not on file  Stress:   . Feeling of Stress : Not on file    Social Connections:   . Frequency of Communication with Friends and Family: Not on file  . Frequency of Social Gatherings with Friends and Family: Not on file  . Attends Religious Services: Not on file  . Active Member of Clubs or Organizations: Not on file  . Attends Club or Organization Meetings: Not on file  . Marital Status: Not on file  Intimate Partner Violence:   . Fear of Current or Ex-Partner: Not on file  . Emotionally Abused: Not on file  . Physically Abused: Not on file  . Sexually Abused: Not on file     ROS: [x] Positive   [ ] Negative   [ ] All sytems reviewed and are negative  Cardiac: [] chest pain/pressure [] SOB [] DOE  Vascular: [] pain in legs while walking  Pulmonary: [] asthma [] wheezing  Neurologic: [] hx stroke   Hematologic: [] bleeding problems  GI [] GERD  GU: [x] CKD/renal failure  [x] HD---[] M/W/F [x] T/T/S  Psychiatric: [] hx of major depression  Integumentary: [] rashes [] ulcers  Constitutional: [] fever [] chills  PHYSICAL EXAMINATION:  Today's Vitals   07/24/19 0918  BP: (!) 188/86  Pulse: 89  Resp: 14  Temp: (!) 97.4 F (36.3 C)  TempSrc: Temporal  SpO2: 100%  Weight: 155 lb (70.3 kg)  Height: 5' 1" (1.549 m)   Body mass index is 29.29 kg/m.    General:  WDWN female in NAD Gait: Normal HENT: WNL Pulmonary: normal non-labored breathing , without Rales, rhonchi,  wheezing Cardiac: regular, without  Murmurs without carotid bruits Abdomen: soft, NT, no masses Skin: without rashes, without ulcers  Vascular Exam/Pulses:   Right Left  Radial 1+ (weak)  1+ (weak)  Ulnar Unable to palpate  Unable to palpate    Extremities:  without ischemic changes, without Gangrene, without cellulitis; without open wounds;  Fistula has +thrill and has 2 aneurysmal areas with thinning skin    Musculoskeletal: no muscle wasting or atrophy  Neurologic: A&O X 3; Moving all extremities equally;  Speech is  fluent/normal  Non-Invasive Vascular Imaging:   none  ASSESSMENT/PLAN: 38 y.o. female with ESRD here for evaluation of her RUA AVF hemodialysis access   -pt was seen by Dr. Dickson earlier this year and Dr. Dickson recommended plication of her aneurysmal area of her fistula.  Unsure why this did not get completed, but she is here for evaluation for thinning   skin over 2 aneurysmal areas (see picture above).  She states these have not bled.   Of note, she had what sounds like angioplasty of her fistula at [facility] earlier this month -discussed with pt plication of both these areas by Dr. Dickson early next week.  I did discuss with her if this bleeds , how to hold pressure and call 911.  She expressed understanding.   -she is not on anticoagulation   Macarena Langseth, PA-C [facility] [phone]  Clinic MD:   [place]  

## 2019-07-24 ENCOUNTER — Encounter: Payer: Self-pay | Admitting: *Deleted

## 2019-07-24 ENCOUNTER — Other Ambulatory Visit: Payer: Self-pay

## 2019-07-24 ENCOUNTER — Ambulatory Visit (INDEPENDENT_AMBULATORY_CARE_PROVIDER_SITE_OTHER): Payer: Medicaid Other | Admitting: Physician Assistant

## 2019-07-24 ENCOUNTER — Encounter (HOSPITAL_COMMUNITY): Payer: Self-pay | Admitting: Vascular Surgery

## 2019-07-24 VITALS — BP 188/86 | HR 89 | Temp 97.4°F | Resp 14 | Ht 61.0 in | Wt 155.0 lb

## 2019-07-24 DIAGNOSIS — Z992 Dependence on renal dialysis: Secondary | ICD-10-CM | POA: Diagnosis not present

## 2019-07-24 DIAGNOSIS — N186 End stage renal disease: Secondary | ICD-10-CM

## 2019-07-24 NOTE — Progress Notes (Signed)
I called [name] at [facility] Short Stay pre-op. She approved same day nasal swab Covid screening for this patient.

## 2019-07-27 ENCOUNTER — Ambulatory Visit (HOSPITAL_COMMUNITY): Payer: Medicaid Other | Admitting: Anesthesiology

## 2019-07-27 ENCOUNTER — Ambulatory Visit (HOSPITAL_COMMUNITY): Payer: Medicaid Other

## 2019-07-27 ENCOUNTER — Ambulatory Visit (HOSPITAL_COMMUNITY)
Admission: RE | Admit: 2019-07-27 | Discharge: 2019-07-27 | Disposition: A | Discharge status: Home / Self Care | Payer: Medicaid Other | Attending: Vascular Surgery | Admitting: Vascular Surgery

## 2019-07-27 ENCOUNTER — Other Ambulatory Visit: Payer: Self-pay

## 2019-07-27 ENCOUNTER — Encounter (HOSPITAL_COMMUNITY): Payer: Self-pay | Admitting: Vascular Surgery

## 2019-07-27 ENCOUNTER — Encounter (HOSPITAL_COMMUNITY)
Admission: RE | Disposition: A | Discharge status: Home / Self Care | Payer: Self-pay | Source: Home / Self Care | Attending: Vascular Surgery

## 2019-07-27 DIAGNOSIS — N186 End stage renal disease: Secondary | ICD-10-CM | POA: Diagnosis not present

## 2019-07-27 DIAGNOSIS — T82510A Breakdown (mechanical) of surgically created arteriovenous fistula, initial encounter: Secondary | ICD-10-CM | POA: Diagnosis not present

## 2019-07-27 DIAGNOSIS — F419 Anxiety disorder, unspecified: Secondary | ICD-10-CM | POA: Diagnosis not present

## 2019-07-27 DIAGNOSIS — Y841 Kidney dialysis as the cause of abnormal reaction of the patient, or of later complication, without mention of misadventure at the time of the procedure: Secondary | ICD-10-CM | POA: Insufficient documentation

## 2019-07-27 DIAGNOSIS — Z7982 Long term (current) use of aspirin: Secondary | ICD-10-CM | POA: Diagnosis not present

## 2019-07-27 DIAGNOSIS — Z992 Dependence on renal dialysis: Secondary | ICD-10-CM | POA: Diagnosis not present

## 2019-07-27 DIAGNOSIS — Z794 Long term (current) use of insulin: Secondary | ICD-10-CM | POA: Insufficient documentation

## 2019-07-27 DIAGNOSIS — E1122 Type 2 diabetes mellitus with diabetic chronic kidney disease: Secondary | ICD-10-CM | POA: Insufficient documentation

## 2019-07-27 DIAGNOSIS — Z20828 Contact with and (suspected) exposure to other viral communicable diseases: Secondary | ICD-10-CM | POA: Diagnosis not present

## 2019-07-27 HISTORY — PX: FISTULA SUPERFICIALIZATION: SHX6341

## 2019-07-27 HISTORY — PX: INSERTION OF DIALYSIS CATHETER: SHX1324

## 2019-07-27 LAB — POCT I-STAT, CHEM 8
BUN: 53 mg/dL — ABNORMAL HIGH (ref 6–20)
Calcium, Ion: 1.06 mmol/L — ABNORMAL LOW (ref 1.15–1.40)
Chloride: 95 mmol/L — ABNORMAL LOW (ref 98–111)
Creatinine, Ser: 8.8 mg/dL — ABNORMAL HIGH (ref 0.44–1.00)
Glucose, Bld: 229 mg/dL — ABNORMAL HIGH (ref 70–99)
HCT: 33 % — ABNORMAL LOW (ref 36.0–46.0)
Hemoglobin: 11.2 g/dL — ABNORMAL LOW (ref 12.0–15.0)
Potassium: 3.7 mmol/L (ref 3.5–5.1)
Sodium: 138 mmol/L (ref 135–145)
TCO2: 35 mmol/L — ABNORMAL HIGH (ref 22–32)

## 2019-07-27 LAB — GLUCOSE, CAPILLARY
Glucose-Capillary: 224 mg/dL — ABNORMAL HIGH (ref 70–99)
Glucose-Capillary: 224 mg/dL — ABNORMAL HIGH (ref 70–99)
Glucose-Capillary: 211 mg/dL — ABNORMAL HIGH (ref 70–99)
Glucose-Capillary: 238 mg/dL — ABNORMAL HIGH (ref 70–99)
Glucose-Capillary: 303 mg/dL — ABNORMAL HIGH (ref 70–99)
Glucose-Capillary: 304 mg/dL — ABNORMAL HIGH (ref 70–99)

## 2019-07-27 LAB — RESPIRATORY PANEL BY RT PCR (FLU A&B, COVID)
Influenza A by PCR: NEGATIVE
Influenza B by PCR: NEGATIVE
SARS Coronavirus 2 by RT PCR: NEGATIVE

## 2019-07-27 LAB — POCT PREGNANCY, URINE: Preg Test, Ur: NEGATIVE

## 2019-07-27 SURGERY — FISTULA SUPERFICIALIZATION
Anesthesia: General | Site: Chest | Laterality: Right

## 2019-07-27 MED ORDER — 0.9 % SODIUM CHLORIDE (POUR BTL) OPTIME
TOPICAL | Status: DC | PRN
  Administered 2019-07-27: 1000 mL

## 2019-07-27 MED ORDER — PROTAMINE SULFATE 10 MG/ML IV SOLN
INTRAVENOUS | Status: AC
  Filled 2019-07-27: qty 5

## 2019-07-27 MED ORDER — OXYCODONE HCL 5 MG PO TABS
ORAL_TABLET | ORAL | Status: AC
  Filled 2019-07-27: qty 1

## 2019-07-27 MED ORDER — SODIUM CHLORIDE 0.9 % IV SOLN
INTRAVENOUS | Status: AC
  Filled 2019-07-27: qty 1.2

## 2019-07-27 MED ORDER — ACETAMINOPHEN 500 MG PO TABS
1000.0000 mg | ORAL_TABLET | Freq: Once | ORAL | Status: AC
  Administered 2019-07-27: 1000 mg via ORAL
  Filled 2019-07-27: qty 2

## 2019-07-27 MED ORDER — LIDOCAINE HCL (PF) 1 % IJ SOLN
INTRAMUSCULAR | Status: AC
  Filled 2019-07-27: qty 30

## 2019-07-27 MED ORDER — INSULIN ASPART 100 UNIT/ML ~~LOC~~ SOLN
6.0000 [IU] | Freq: Once | SUBCUTANEOUS | Status: AC
  Administered 2019-07-27: 6 [IU] via SUBCUTANEOUS

## 2019-07-27 MED ORDER — FENTANYL CITRATE (PF) 100 MCG/2ML IJ SOLN
INTRAMUSCULAR | Status: DC | PRN
  Administered 2019-07-27 (×2): 50 ug via INTRAVENOUS

## 2019-07-27 MED ORDER — ONDANSETRON HCL 4 MG/2ML IJ SOLN
INTRAMUSCULAR | Status: AC
  Filled 2019-07-27: qty 2

## 2019-07-27 MED ORDER — INSULIN ASPART 100 UNIT/ML ~~LOC~~ SOLN
SUBCUTANEOUS | Status: AC
  Filled 2019-07-27: qty 1

## 2019-07-27 MED ORDER — MIDAZOLAM HCL 5 MG/5ML IJ SOLN
INTRAMUSCULAR | Status: DC | PRN
  Administered 2019-07-27: 2 mg via INTRAVENOUS

## 2019-07-27 MED ORDER — HYDROMORPHONE HCL 1 MG/ML IJ SOLN
0.2500 mg | INTRAMUSCULAR | Status: DC | PRN
  Administered 2019-07-27 (×3): 0.5 mg via INTRAVENOUS

## 2019-07-27 MED ORDER — ONDANSETRON HCL 4 MG/2ML IJ SOLN
INTRAMUSCULAR | Status: DC | PRN
  Administered 2019-07-27: 4 mg via INTRAVENOUS

## 2019-07-27 MED ORDER — SODIUM CHLORIDE 0.9 % IV SOLN
INTRAVENOUS | Status: DC | PRN
  Administered 2019-07-27: 500 mL

## 2019-07-27 MED ORDER — DEXAMETHASONE SODIUM PHOSPHATE 10 MG/ML IJ SOLN
INTRAMUSCULAR | Status: DC | PRN
  Administered 2019-07-27: 10 mg via INTRAVENOUS

## 2019-07-27 MED ORDER — HEPARIN SODIUM (PORCINE) 1000 UNIT/ML IJ SOLN
INTRAMUSCULAR | Status: AC
  Filled 2019-07-27: qty 1

## 2019-07-27 MED ORDER — HYDROMORPHONE HCL 1 MG/ML IJ SOLN
INTRAMUSCULAR | Status: AC
  Filled 2019-07-27: qty 1

## 2019-07-27 MED ORDER — LIDOCAINE-EPINEPHRINE 1 %-1:100000 IJ SOLN
INTRAMUSCULAR | Status: AC
  Filled 2019-07-27: qty 1

## 2019-07-27 MED ORDER — LIDOCAINE-EPINEPHRINE 0.5 %-1:200000 IJ SOLN
INTRAMUSCULAR | Status: DC | PRN
  Administered 2019-07-27: 20 mL

## 2019-07-27 MED ORDER — LIDOCAINE 2% (20 MG/ML) 5 ML SYRINGE
INTRAMUSCULAR | Status: DC | PRN
  Administered 2019-07-27: 100 mg via INTRAVENOUS

## 2019-07-27 MED ORDER — PROTAMINE SULFATE 10 MG/ML IV SOLN
INTRAVENOUS | Status: DC | PRN
  Administered 2019-07-27: 30 mg via INTRAVENOUS

## 2019-07-27 MED ORDER — DEXAMETHASONE SODIUM PHOSPHATE 10 MG/ML IJ SOLN
INTRAMUSCULAR | Status: AC
  Filled 2019-07-27: qty 1

## 2019-07-27 MED ORDER — OXYCODONE HCL 5 MG PO TABS
5.0000 mg | ORAL_TABLET | ORAL | Status: DC | PRN
  Administered 2019-07-27: 5 mg via ORAL

## 2019-07-27 MED ORDER — MIDAZOLAM HCL 2 MG/2ML IJ SOLN
INTRAMUSCULAR | Status: AC
  Filled 2019-07-27: qty 2

## 2019-07-27 MED ORDER — CHLORHEXIDINE GLUCONATE 4 % EX LIQD: 60.0000 mL | Freq: Once | CUTANEOUS | Status: DC

## 2019-07-27 MED ORDER — PHENYLEPHRINE HCL-NACL 10-0.9 MG/250ML-% IV SOLN
INTRAVENOUS | Status: DC | PRN
  Administered 2019-07-27: 30 ug/min via INTRAVENOUS

## 2019-07-27 MED ORDER — PROPOFOL 10 MG/ML IV BOLUS
INTRAVENOUS | Status: DC | PRN
  Administered 2019-07-27: 200 mg via INTRAVENOUS

## 2019-07-27 MED ORDER — HEPARIN SODIUM (PORCINE) 1000 UNIT/ML IJ SOLN
INTRAMUSCULAR | Status: DC | PRN
  Administered 2019-07-27: 6000 [IU] via INTRAVENOUS

## 2019-07-27 MED ORDER — FENTANYL CITRATE (PF) 250 MCG/5ML IJ SOLN
INTRAMUSCULAR | Status: AC
  Filled 2019-07-27: qty 5

## 2019-07-27 MED ORDER — PHENYLEPHRINE 40 MCG/ML (10ML) SYRINGE FOR IV PUSH (FOR BLOOD PRESSURE SUPPORT)
PREFILLED_SYRINGE | INTRAVENOUS | Status: DC | PRN
  Administered 2019-07-27: 80 ug via INTRAVENOUS

## 2019-07-27 MED ORDER — OXYCODONE HCL 5 MG PO TABS: 5.0000 mg | ORAL_TABLET | ORAL | 0 refills | Status: DC | PRN

## 2019-07-27 MED ORDER — CEFAZOLIN SODIUM-DEXTROSE 2-4 GM/100ML-% IV SOLN
2.0000 g | INTRAVENOUS | Status: AC
  Administered 2019-07-27: 11:00:00 2 g via INTRAVENOUS
  Filled 2019-07-27: qty 100

## 2019-07-27 MED ORDER — SODIUM CHLORIDE 0.9 % IV SOLN
INTRAVENOUS | Status: DC
  Administered 2019-07-27: 10 mL/h via INTRAVENOUS

## 2019-07-27 MED ORDER — LIDOCAINE-EPINEPHRINE 0.5 %-1:200000 IJ SOLN
INTRAMUSCULAR | Status: AC
  Filled 2019-07-27: qty 1

## 2019-07-27 MED ORDER — PROPOFOL 10 MG/ML IV BOLUS
INTRAVENOUS | Status: AC
  Filled 2019-07-27: qty 20

## 2019-07-27 MED ORDER — HEPARIN SODIUM (PORCINE) 1000 UNIT/ML IJ SOLN
INTRAMUSCULAR | Status: DC | PRN
  Administered 2019-07-27: 3800 [IU] via INTRAVENOUS

## 2019-07-27 SURGICAL SUPPLY — 59 items
ARMBAND PINK RESTRICT EXTREMIT (MISCELLANEOUS) ×4 IMPLANT
BAG DECANTER FOR FLEXI CONT (MISCELLANEOUS) ×4 IMPLANT
BIOPATCH RED 1 DISK 7.0 (GAUZE/BANDAGES/DRESSINGS) ×3 IMPLANT
BIOPATCH RED 1IN DISK 7.0MM (GAUZE/BANDAGES/DRESSINGS) ×1
CANISTER SUCT 3000ML PPV (MISCELLANEOUS) ×4 IMPLANT
CANNULA VESSEL 3MM 2 BLNT TIP (CANNULA) ×4 IMPLANT
CATH BEACON 5 .035 65 KMP TIP (CATHETERS) ×4 IMPLANT
CATH PALINDROME RT-P 15FX19CM (CATHETERS) IMPLANT
CATH PALINDROME RT-P 15FX23CM (CATHETERS) IMPLANT
CATH PALINDROME RT-P 15FX28CM (CATHETERS) IMPLANT
CATH PALINDROME RT-P 15FX55CM (CATHETERS) IMPLANT
CHLORAPREP W/TINT 26 (MISCELLANEOUS) ×4 IMPLANT
CLIP VESOCCLUDE MED 6/CT (CLIP) ×4 IMPLANT
CLIP VESOCCLUDE SM WIDE 6/CT (CLIP) ×4 IMPLANT
COVER DOME SNAP 22 D (MISCELLANEOUS) ×4 IMPLANT
COVER PROBE W GEL 5X96 (DRAPES) ×4 IMPLANT
COVER SURGICAL LIGHT HANDLE (MISCELLANEOUS) ×4 IMPLANT
COVER WAND RF STERILE (DRAPES) ×4 IMPLANT
DECANTER SPIKE VIAL GLASS SM (MISCELLANEOUS) ×4 IMPLANT
DERMABOND ADVANCED (GAUZE/BANDAGES/DRESSINGS) ×6
DERMABOND ADVANCED .7 DNX12 (GAUZE/BANDAGES/DRESSINGS) ×6 IMPLANT
DEVICE TORQUE KENDALL .025-038 (MISCELLANEOUS) ×4 IMPLANT
DRAPE C-ARM 42X72 X-RAY (DRAPES) ×4 IMPLANT
DRAPE CHEST BREAST 15X10 FENES (DRAPES) ×4 IMPLANT
ELECT REM PT RETURN 9FT ADLT (ELECTROSURGICAL) ×4
ELECTRODE REM PT RTRN 9FT ADLT (ELECTROSURGICAL) ×2 IMPLANT
GAUZE 4X4 16PLY RFD (DISPOSABLE) ×4 IMPLANT
GLOVE BIO SURGEON STRL SZ7.5 (GLOVE) ×4 IMPLANT
GLOVE BIOGEL PI IND STRL 8 (GLOVE) ×2 IMPLANT
GLOVE BIOGEL PI INDICATOR 8 (GLOVE) ×2
GOWN STRL REUS W/ TWL LRG LVL3 (GOWN DISPOSABLE) ×6 IMPLANT
GOWN STRL REUS W/TWL LRG LVL3 (GOWN DISPOSABLE) ×6
GUIDEWIRE ANGLED .035X150CM (WIRE) ×4 IMPLANT
KIT BASIN OR (CUSTOM PROCEDURE TRAY) ×4 IMPLANT
KIT TURNOVER KIT B (KITS) ×4 IMPLANT
NEEDLE 18GX1X1/2 (RX/OR ONLY) (NEEDLE) ×4 IMPLANT
NEEDLE HYPO 25GX1X1/2 BEV (NEEDLE) ×4 IMPLANT
NS IRRIG 1000ML POUR BTL (IV SOLUTION) ×4 IMPLANT
PACK CV ACCESS (CUSTOM PROCEDURE TRAY) ×4 IMPLANT
PACK SURGICAL SETUP 50X90 (CUSTOM PROCEDURE TRAY) ×4 IMPLANT
PAD ARMBOARD 7.5X6 YLW CONV (MISCELLANEOUS) ×8 IMPLANT
SET MICROPUNCTURE 5F STIFF (MISCELLANEOUS) ×4 IMPLANT
SPONGE LAP 18X18 X RAY DECT (DISPOSABLE) ×4 IMPLANT
SPONGE SURGIFOAM ABS GEL 100 (HEMOSTASIS) IMPLANT
SUT ETHILON 3 0 PS 1 (SUTURE) ×4 IMPLANT
SUT PROLENE 5 0 C 1 24 (SUTURE) ×4 IMPLANT
SUT PROLENE 6 0 BV (SUTURE) ×4 IMPLANT
SUT VIC AB 3-0 SH 27 (SUTURE) ×2
SUT VIC AB 3-0 SH 27X BRD (SUTURE) ×2 IMPLANT
SUT VICRYL 4-0 PS2 18IN ABS (SUTURE) ×8 IMPLANT
SYR 10ML LL (SYRINGE) ×4 IMPLANT
SYR 20ML LL LF (SYRINGE) ×8 IMPLANT
SYR 5ML LL (SYRINGE) ×8 IMPLANT
SYR CONTROL 10ML LL (SYRINGE) ×4 IMPLANT
TOWEL GREEN STERILE (TOWEL DISPOSABLE) ×8 IMPLANT
TOWEL GREEN STERILE FF (TOWEL DISPOSABLE) ×4 IMPLANT
UNDERPAD 30X30 (UNDERPADS AND DIAPERS) ×4 IMPLANT
WATER STERILE IRR 1000ML POUR (IV SOLUTION) ×4 IMPLANT
WIRE AMPLATZ SS-J .035X180CM (WIRE) ×4 IMPLANT

## 2019-07-27 NOTE — Anesthesia Procedure Notes (Signed)
Procedure Name: LMA Insertion Date/Time: 07/27/2019 12:34 PM Performed by: Lance Coon, CRNA Pre-anesthesia Checklist: Patient identified, Emergency Drugs available, Suction available, Patient being monitored and Timeout performed Patient Re-evaluated:Patient Re-evaluated prior to induction Oxygen Delivery Method: Circle system utilized Preoxygenation: Pre-oxygenation with 100% oxygen Induction Type: IV induction LMA: LMA inserted LMA Size: 4.0 Number of attempts: 1 Placement Confirmation: positive ETCO2 and breath sounds checked- equal and bilateral Tube secured with: Tape Dental Injury: Teeth and Oropharynx as per pre-operative assessment

## 2019-07-27 NOTE — Interval H&P Note (Signed)
History and Physical Interval Note:  07/27/2019 9:56 AM  [name]  has presented today for surgery, with the diagnosis of ANEURYSM OF ARTERIOVENOUS FISTULA RIGHT ARM.  The various methods of treatment have been discussed with the patient and family. After consideration of risks, benefits and other options for treatment, the patient has consented to  Procedure(s): 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 (Right) INSERTION OF TUNNELED  DIALYSIS CATHETER (N/A) as a surgical intervention.  The patient's history has been reviewed, patient examined, no change in status, stable for surgery.  I have reviewed the patient's chart and labs.  Questions were answered to the patient's satisfaction.     Deitra Mayo

## 2019-07-27 NOTE — Progress Notes (Signed)
Patient is in phase II resting until [facility] transportation picks her and her husband up.

## 2019-07-27 NOTE — Anesthesia Preprocedure Evaluation (Addendum)
Anesthesia Evaluation  Patient identified by MRN, date of birth, ID band Patient awake    Reviewed: Allergy & Precautions, H&P , NPO status , Patient's Chart, lab work & pertinent test results  Airway Mallampati: II  TM Distance: >3 FB Neck ROM: Full    Dental no notable dental hx. (+) Partial Upper, Partial Lower, Dental Advisory Given   Pulmonary neg pulmonary ROS,    Pulmonary exam normal breath sounds clear to auscultation       Cardiovascular negative cardio ROS   Rhythm:Regular Rate:Normal     Neuro/Psych Anxiety negative neurological ROS     GI/Hepatic negative GI ROS, Neg liver ROS,   Endo/Other  diabetes, Type 1, Insulin Dependent  Renal/GU ESRF and DialysisRenal disease  negative genitourinary   Musculoskeletal   Abdominal   Peds  Hematology  (+) Blood dyscrasia, anemia ,   Anesthesia Other Findings   Reproductive/Obstetrics negative OB ROS                            Anesthesia Physical Anesthesia Plan  ASA: III  Anesthesia Plan: General   Post-op Pain Management:    Induction: Intravenous  PONV Risk Score and Plan: 4 or greater and Ondansetron and Midazolam  Airway Management Planned: LMA  Additional Equipment:   Intra-op Plan:   Post-operative Plan: Extubation in OR  Informed Consent: I have reviewed the patients History and Physical, chart, labs and discussed the procedure including the risks, benefits and alternatives for the proposed anesthesia with the patient or authorized representative who has indicated his/her understanding and acceptance.     Dental advisory given  Plan Discussed with: CRNA  Anesthesia Plan Comments:         Anesthesia Quick Evaluation

## 2019-07-27 NOTE — Op Note (Signed)
    NAME: [name]    MRN: [id number] DOB: 1980-09-21    DATE OF OPERATION: 07/27/2019  PREOP DIAGNOSIS:    Aneurysmal right upper arm fistula  POSTOP DIAGNOSIS:    Same As you need help ANESTHESIA: General  EBL: Minimal  INDICATIONS:    Luberta Reigner is a 38 y.o. female who had a right upper arm fistula with an aneurysmal segment and thinning of the overlying skin.  She was set up for plication of this.  Given that there would not be room to cannulate the fistula once this was revised I recommended that we place a catheter also.  FINDINGS:   I placed the catheter on the left side since the fistula on the right was being revised I did not want to potentially create any outflow obstruction as the fistula was already somewhat pulsatile.  There was a good thrill in the fistula at the completion of the procedure.  TECHNIQUE:   The patient was taken to the operating room and received a general anesthetic.  The neck and upper chest were prepped and draped in usual sterile fashion.  Under ultrasound guidance, after the skin was anesthetized, the left IJ was cannulated and a guidewire introduced into the superior vena cava under fluoroscopic Introl.  Was somewhat difficult to pass the dilator because of the tortuosity and therefore advanced the wire down into the inferior vena cava using a Kumpe catheter and then exchanged this for a stiff wire.  The tract over the wire was then dilated and then a dilator and peel-away sheath advanced over the wire and the dilator removed.  The 28 cm catheter was advanced over the wire through the peel-away sheath down into the right atrium and the wire and peel-away sheath were removed.  The exit site for the cath was selected and the cath was brought through the tunnel.  Both ports withdrew easily with and flushed with heparinized saline and filled with concentrated heparin.  The catheter was secured at its exit site with a 3-0 nylon suture.  The IJ cannulation  site was closed with a 4-0 subcuticular stitch.  A sterile dressing was applied.  Attention was then turned to the right arm.  The right arm was prepped and draped in usual sterile fashion.  A large elliptical incision was made over this large aneurysm in the aneurysm dissected free circumferentially.  The patient was heparinized.  The fistula was clamped proximal and distal to the aneurysm and a large amount of the aneurysm was excised.  The vein was then sewn back with running 5-0 Prolene suture.  I then placed some tacking sutures to roll the anastomosis slightly lateral so that if the vein was cannulated from the anterior aspect this would not hit the suture line.  Hemostasis was obtained in the wound.  This wound was closed with a deep layer of 3-0 Vicryl and the skin closed with 4-0 Vicryl.  Made a separate elliptical incision over the area where there is some compromise skin and here dissected th underlying tissue enough that I could close over this smaller aneurysm.  This was done with 2 interrupted 3-0 Vicryl's and a 4-0 Vicryl.  Dermabond was applied.  The patient tolerated procedure well was transferred to recovery room in stable condition.  All needle and sponge counts were correct.   Deitra Mayo, MD, FACS [facility]  DATE OF DICTATION:   07/27/2019

## 2019-07-27 NOTE — Discharge Instructions (Signed)

## 2019-07-27 NOTE — Transfer of Care (Signed)
Immediate Anesthesia Transfer of Care Note  Patient: Shatia Krishna  Procedure(s) Performed: 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 (Right Arm Upper) INSERTION OF TUNNELED  DIALYSIS CATHETER (Left Chest)  Patient Location: PACU  Anesthesia Type:General  Level of Consciousness: drowsy and patient cooperative  Airway & Oxygen Therapy: Patient Spontanous Breathing  Post-op Assessment: Report given to RN and Post -op Vital signs reviewed and stable  Post vital signs: Reviewed and stable  Last Vitals:  Vitals Value Taken Time  BP 165/90 07/27/19 1241  Temp    Pulse 83 07/27/19 1244  Resp 14 07/27/19 1244  SpO2 97 % 07/27/19 1244  Vitals shown include unvalidated device data.  Last Pain:  Vitals:   07/27/19 0750  TempSrc:   PainSc: 0-No pain      Patients Stated Pain Goal: 5 ([id number])  Complications: No apparent anesthesia complications

## 2019-07-27 NOTE — Anesthesia Postprocedure Evaluation (Signed)
Anesthesia Post Note  Patient: Genella Schouten  Procedure(s) Performed: 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 (Right Arm Upper) INSERTION OF TUNNELED  DIALYSIS CATHETER (Left Chest)     Patient location during evaluation: PACU Anesthesia Type: General Level of consciousness: awake and alert Pain management: pain level controlled Vital Signs Assessment: post-procedure vital signs reviewed and stable Respiratory status: spontaneous breathing, nonlabored ventilation and respiratory function stable Cardiovascular status: blood pressure returned to baseline and stable Postop Assessment: no apparent nausea or vomiting Anesthetic complications: no    Last Vitals:  Vitals:   07/27/19 1355 07/27/19 1410  BP: 130/66 135/76  Pulse: 77 81  Resp: 11 (!) 24  Temp:  36.7 C  SpO2: 98% 97%    Last Pain:  Vitals:   07/27/19 1410  TempSrc:   PainSc: 6                  Kyrielle Urbanski,W. EDMOND

## 2019-07-28 DIAGNOSIS — T829XXA Unspecified complication of cardiac and vascular prosthetic device, implant and graft, initial encounter: Secondary | ICD-10-CM | POA: Insufficient documentation

## 2019-09-01 ENCOUNTER — Telehealth (HOSPITAL_COMMUNITY): Payer: Self-pay

## 2019-09-01 NOTE — Progress Notes (Signed)
  POST OPERATIVE OFFICE NOTE    CC:  F/u for surgery  HPI:  This is a 39 y.o. female who is s/p plication of aneurysmal dilatation of right upper arm arteriovenous fistula.  She states she has some tenderness over her left IJ dialysis catheter.  Her dressing was changed yesterday and there was no erythema or drainage.  No difficulties with treatment.  She denies left upper extremity pain, pain or numbness in her left hand  Dialysis  Center: [place]. [address] Days of treatment: TTS  Allergies  Allergen Reactions  . Peanut-Containing Drug Products Anaphylaxis and Hives  . Chocolate Hives  . Morphine Rash    Current Outpatient Medications  Medication Sig Dispense Refill  . Accu-Chek FastClix Lancets MISC Use as instructed to check blood sugar up to TID. E11.22 102 each 11  . Biotin w/ Vitamins C & E (HAIR/SKIN/NAILS PO) Take 1 capsule by mouth daily.    . Blood Glucose Monitoring Suppl (ACCU-CHEK GUIDE ME) w/Device KIT 1 kit by Does not apply route 3 (three) times daily. Use to check BG at home up to 3 times daily. E11.22 1 kit 0  . calcitRIOL (ROCALTROL) 0.25 MCG capsule Take 0.25 mcg by mouth 2 (two) times daily.     Marland Kitchen FOSRENOL 1000 MG PACK Take 1,000 mg by mouth See admin instructions. Take 1000 mg by mouth with meals and snacks    . glucose blood (ACCU-CHEK GUIDE) test strip Use as instructed to check blood sugar up to TID. E11.22 100 each 12  . insulin aspart (NOVOLOG) 100 UNIT/ML injection Inject 5 Units into the skin 3 (three) times daily with meals. (Patient taking differently: Inject 5-20 Units into the skin 3 (three) times daily with meals. ) 10 mL 6  . insulin glargine (LANTUS) 100 UNIT/ML injection Inject 25 units daily before or after lunch (Patient taking differently: Inject 30 Units into the skin daily as needed (blood sugar over 200). ) 10 mL 6  . Insulin Pen Needle (PEN NEEDLES) 30G X 5 MM MISC 1 each by Does not apply route 3 (three) times daily. 100 each  11  . Insulin Syringes, Disposable, U-100 0.5 ML MISC Use as directed 100 each 3  . multivitamin (RENA-VIT) TABS tablet Take 1 tablet by mouth daily. 90 tablet 3  . oxyCODONE (ROXICODONE) 5 MG immediate release tablet Take 1 tablet (5 mg total) by mouth every 4 (four) hours as needed. 15 tablet 0  . predniSONE (DELTASONE) 20 MG tablet Take 20 mg by mouth 2 (two) times daily.     No current facility-administered medications for this visit.     ROS:  See HPI  Physical Exam:  The patient is alert and oriented x4.  In no apparent distress  Incision: Right upper arm incisions are well approximated without erythema or drainage Extremities: Right hand is warm, normal range of motion   Assessment/Plan:  This is a 39 y.o. female who is s/p: Plication of right upper extremity brachiocephalic fistula.  She is now 1 month postoperative.  Okay to begin using fistula.  Once her nephrologist are satisfied with treatments via her fistula, plans will be made for removal of dialysis catheter  -   Jannet Mantis, PA-C [facility] [phone]  Clinic MD: Scot Dock

## 2019-09-01 NOTE — Telephone Encounter (Signed)

## 2019-09-02 ENCOUNTER — Ambulatory Visit (INDEPENDENT_AMBULATORY_CARE_PROVIDER_SITE_OTHER): Payer: Self-pay | Admitting: Physician Assistant

## 2019-09-02 ENCOUNTER — Other Ambulatory Visit: Payer: Self-pay

## 2019-09-02 VITALS — BP 182/88 | HR 88 | Temp 98.1°F | Resp 16 | Ht 61.0 in | Wt 145.0 lb

## 2019-09-02 DIAGNOSIS — Z992 Dependence on renal dialysis: Secondary | ICD-10-CM

## 2019-09-02 DIAGNOSIS — N186 End stage renal disease: Secondary | ICD-10-CM

## 2019-09-28 LAB — HM DIABETES EYE EXAM

## 2019-10-07 ENCOUNTER — Ambulatory Visit: Payer: Medicaid Other | Admitting: Podiatry

## 2019-11-27 ENCOUNTER — Ambulatory Visit: Payer: Medicaid Other | Admitting: Podiatry

## 2019-11-27 ENCOUNTER — Encounter: Payer: Self-pay | Admitting: Podiatry

## 2019-11-27 ENCOUNTER — Other Ambulatory Visit: Payer: Self-pay

## 2019-11-27 VITALS — BP 76/44 | HR 86 | Temp 97.3°F

## 2019-11-27 DIAGNOSIS — Z794 Long term (current) use of insulin: Secondary | ICD-10-CM

## 2019-11-27 DIAGNOSIS — N186 End stage renal disease: Secondary | ICD-10-CM | POA: Diagnosis not present

## 2019-11-27 DIAGNOSIS — E1165 Type 2 diabetes mellitus with hyperglycemia: Secondary | ICD-10-CM | POA: Diagnosis not present

## 2019-11-27 DIAGNOSIS — Z992 Dependence on renal dialysis: Secondary | ICD-10-CM

## 2019-11-27 DIAGNOSIS — E1122 Type 2 diabetes mellitus with diabetic chronic kidney disease: Secondary | ICD-10-CM

## 2019-11-27 NOTE — Progress Notes (Signed)
This patient presents to the office  For her diabetic feet.  This patient  says there  is  no pain or  discomfort in her feet.  This patient says there are long thick painful nails.  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  . This patient presents  to the office today for  a foot evaluation due to history of  Diabetes. Patient requests diabetic shoes.  General Appearance  Alert, conversant and in no acute stress.  Vascular  Dorsalis pedis  pulses are palpable  bilaterally. Posterior tibial pulses are absent  B/L.   Capillary return is within normal limits  bilaterally. Temperature is within normal limits  bilaterally.  Neurologic  Senn-Weinstein monofilament wire test within normal limits  bilaterally. Muscle power within normal limits bilaterally.  Nails Thick disfigured discolored nails with subungual debris  from hallux to fifth toes bilaterally. No evidence of bacterial infection or drainage bilaterally.  Orthopedic  No limitations of motion of motion feet .  No crepitus or effusions noted.  No bony pathology or digital deformities noted.  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  Amputation second toe left foot.    Skin  normotropic skin noted bilaterally.  No signs of infections or ulcers noted.   Asymptomatic porokeratosis left forefoot.    Diabetes with no foot complications  IE   A diabetic foot exam was performed and there is no evidence of any vascular or neurologic pathology. Patient was given a prescription for diabetic shoes from  [facility].  RTC prn   Gardiner Barefoot DPM

## 2020-02-05 ENCOUNTER — Other Ambulatory Visit: Payer: Self-pay

## 2020-02-05 ENCOUNTER — Emergency Department (HOSPITAL_COMMUNITY)
Admission: EM | Admit: 2020-02-05 | Discharge: 2020-02-05 | Disposition: A | Discharge status: Home / Self Care | Payer: Medicaid Other | Attending: Emergency Medicine | Admitting: Emergency Medicine

## 2020-02-05 ENCOUNTER — Encounter (HOSPITAL_COMMUNITY): Payer: Self-pay

## 2020-02-05 ENCOUNTER — Emergency Department (HOSPITAL_COMMUNITY): Payer: Medicaid Other

## 2020-02-05 DIAGNOSIS — Z91010 Allergy to peanuts: Secondary | ICD-10-CM | POA: Diagnosis not present

## 2020-02-05 DIAGNOSIS — Z992 Dependence on renal dialysis: Secondary | ICD-10-CM | POA: Diagnosis not present

## 2020-02-05 DIAGNOSIS — Z79899 Other long term (current) drug therapy: Secondary | ICD-10-CM | POA: Diagnosis not present

## 2020-02-05 DIAGNOSIS — E1122 Type 2 diabetes mellitus with diabetic chronic kidney disease: Secondary | ICD-10-CM | POA: Insufficient documentation

## 2020-02-05 DIAGNOSIS — Z794 Long term (current) use of insulin: Secondary | ICD-10-CM | POA: Insufficient documentation

## 2020-02-05 DIAGNOSIS — R0789 Other chest pain: Secondary | ICD-10-CM | POA: Insufficient documentation

## 2020-02-05 DIAGNOSIS — R05 Cough: Secondary | ICD-10-CM | POA: Diagnosis present

## 2020-02-05 DIAGNOSIS — R059 Cough, unspecified: Secondary | ICD-10-CM

## 2020-02-05 DIAGNOSIS — N186 End stage renal disease: Secondary | ICD-10-CM | POA: Diagnosis not present

## 2020-02-05 LAB — BASIC METABOLIC PANEL
Anion gap: 13 (ref 5–15)
BUN: 28 mg/dL — ABNORMAL HIGH (ref 6–20)
CO2: 27 mmol/L (ref 22–32)
Calcium: 9.1 mg/dL (ref 8.9–10.3)
Chloride: 96 mmol/L — ABNORMAL LOW (ref 98–111)
Creatinine, Ser: 7.82 mg/dL — ABNORMAL HIGH (ref 0.44–1.00)
GFR calc Af Amer: 7 mL/min — ABNORMAL LOW (ref >60)
GFR calc non Af Amer: 6 mL/min — ABNORMAL LOW (ref >60)
Glucose, Bld: 230 mg/dL — ABNORMAL HIGH (ref 70–99)
Potassium: 5.2 mmol/L — ABNORMAL HIGH (ref 3.5–5.1)
Sodium: 136 mmol/L (ref 135–145)

## 2020-02-05 LAB — CBC WITH DIFFERENTIAL/PLATELET
Abs Immature Granulocytes: 0.01 10*3/uL (ref 0.00–0.07)
Basophils Absolute: 0 10*3/uL (ref 0.0–0.1)
Basophils Relative: 1 %
Eosinophils Absolute: 0.7 10*3/uL — ABNORMAL HIGH (ref 0.0–0.5)
Eosinophils Relative: 11 %
HCT: 37.3 % (ref 36.0–46.0)
Hemoglobin: 11.5 g/dL — ABNORMAL LOW (ref 12.0–15.0)
Immature Granulocytes: 0 %
Lymphocytes Relative: 28 %
Lymphs Abs: 1.6 10*3/uL (ref 0.7–4.0)
MCH: 28.5 pg (ref 26.0–34.0)
MCHC: 30.8 g/dL (ref 30.0–36.0)
MCV: 92.6 fL (ref 80.0–100.0)
Monocytes Absolute: 0.4 10*3/uL (ref 0.1–1.0)
Monocytes Relative: 8 %
Neutro Abs: 3 10*3/uL (ref 1.7–7.7)
Neutrophils Relative %: 52 %
Platelets: 168 10*3/uL (ref 150–400)
RBC: 4.03 MIL/uL (ref 3.87–5.11)
RDW: 15.3 % (ref 11.5–15.5)
WBC: 5.8 10*3/uL (ref 4.0–10.5)
nRBC: 0 % (ref 0.0–0.2)

## 2020-02-05 MED ORDER — HYDROCODONE-ACETAMINOPHEN 5-325 MG PO TABS
1.0000 | ORAL_TABLET | Freq: Once | ORAL | Status: AC
  Administered 2020-02-05: 1 via ORAL
  Filled 2020-02-05: qty 1

## 2020-02-05 MED ORDER — BENZONATATE 100 MG PO CAPS: 100.0000 mg | ORAL_CAPSULE | Freq: Three times a day (TID) | ORAL | 0 refills | Status: DC

## 2020-02-05 MED ORDER — BENZONATATE 100 MG PO CAPS
100.0000 mg | ORAL_CAPSULE | Freq: Once | ORAL | Status: AC
  Administered 2020-02-05: 100 mg via ORAL
  Filled 2020-02-05: qty 1

## 2020-02-05 NOTE — ED Provider Notes (Signed)
[facility] EMERGENCY DEPARTMENT Provider Note   CSN: [id number] Arrival date & time: 02/05/20  7416     History Chief Complaint  Patient presents with  . Cough    [name] is a 39 y.o. female with history of insulin dependent DM and ESRD on dialysis Tu/Thurs/Sat who presents with a cough and R sided chest pain. She states that she's had a dry cough for about one week. Since she's been coughing she's developed R sided rib pain with coughing. She denies fever, chills, body aches, SOB, leg swelling. She went to dialysis yesterday for a full session. She has had both COVID vaccines and denies sick contacts. She has had runny nose and sneezing. She's tried Nyquil without significant relief. No ACE-I or problems with acid reflux. She does not smoke.  HPI     Past Medical History:  Diagnosis Date  . Anemia   . Anxiety   . Blind right eye 2008  . Diabetes mellitus without complication ([place])   . Dialysis patient ([place])   . ESRD (end stage renal disease) [facility])    Dialysis T/Th/Sa    Patient Active Problem List   Diagnosis Date Noted  . Fever   . Hyperglycemia due to type 2 diabetes mellitus ([place]) 02/21/2019  . Suspected COVID-19 virus infection 02/21/2019  . ESRD on dialysis ([place]) 02/21/2019  . Anemia 02/11/2019  . Diabetes mellitus ([place]) 02/11/2019  . HLD (hyperlipidemia) 02/11/2019  . PTSD (post-traumatic stress disorder) 02/11/2019  . PVD (peripheral vascular disease) ([place]) 02/11/2019  . Retinopathy 02/11/2019  . Secondary hyperparathyroidism of renal origin ([place]) 02/11/2019  . Cataracts, bilateral 01/05/2015  . Hyperphosphatemia 01/05/2015  . Status post amputation of toe of right foot ([place]) 01/05/2015    Past Surgical History:  Procedure Laterality Date  . AMPUTATION TOE Left   . AV FISTULA PLACEMENT    . CESAREAN SECTION  2011  . FISTULA SUPERFICIALIZATION Right 38/45/3646   Procedure: 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;   Surgeon: Angelia Mould, MD;  Location: [place];  Service: Vascular;  Laterality: Right;  . INSERTION OF DIALYSIS CATHETER Left 07/27/2019   Procedure: INSERTION OF TUNNELED  DIALYSIS CATHETER;  Surgeon: Angelia Mould, MD;  [facility];  Service: Vascular;  Laterality: Left;     OB History   No obstetric history on file.     Family History  Problem Relation Age of Onset  . Diabetes Mother   . Diabetes Father     Social History   Tobacco Use  . Smoking status: Never Smoker  . Smokeless tobacco: Never Used  Vaping Use  . Vaping Use: Never used  Substance Use Topics  . Alcohol use: No  . Drug use: No    Home Medications Prior to Admission medications   Medication Sig Start Date End Date Taking? Authorizing Provider  Accu-Chek FastClix Lancets MISC Use as instructed to check blood sugar up to TID. E11.22 04/15/19   Charlott Rakes, MD  Biotin w/ Vitamins C & E (HAIR/SKIN/NAILS PO) Take 1 capsule by mouth daily.    [provider]  Blood Glucose Monitoring Suppl (ACCU-CHEK GUIDE ME) w/Device KIT 1 kit by Does not apply route 3 (three) times daily. Use to check BG at home up to 3 times daily. E11.22 04/15/19   Charlott Rakes, MD  calcitRIOL (ROCALTROL) 0.25 MCG capsule Take 0.25 mcg by mouth 2 (two) times daily.     [provider]  cinacalcet [facility])  90 MG tablet Take by mouth. 09/22/19   [provider]  Darbepoetin Alfa (ARANESP, ALBUMIN FREE, IJ) Darbepoetin Alfa (Aranesp) 11/17/19 11/15/20  [provider]  FOSRENOL 1000 MG PACK Take 1,000 mg by mouth See admin instructions. Take 1000 mg by mouth with meals and snacks 12/25/18   [provider]  glucose blood (ACCU-CHEK GUIDE) test strip Use as instructed to check blood sugar up to TID. E11.22 07/17/19   Fulp, Cammie, MD  insulin aspart (NOVOLOG) 100 UNIT/ML injection Inject 5 Units into the skin 3 (three) times daily with meals. Patient taking differently: Inject 5-20  Units into the skin 3 (three) times daily with meals.  07/17/19   Fulp, Cammie, MD  insulin glargine (LANTUS) 100 UNIT/ML injection Inject 25 units daily before or after lunch Patient taking differently: Inject 30 Units into the skin daily as needed (blood sugar over 200).  07/17/19   Fulp, Cammie, MD  Insulin Pen Needle (PEN NEEDLES) 30G X 5 MM MISC 1 each by Does not apply route 3 (three) times daily. 07/17/19   Fulp, Cammie, MD  Insulin Syringes, Disposable, U-100 0.5 ML MISC Use as directed 04/16/19   Charlott Rakes, MD  lidocaine-prilocaine (EMLA) cream Apply topically. 09/07/19   [provider]  multivitamin (RENA-VIT) TABS tablet Take 1 tablet by mouth daily. 07/18/19   Fulp, Cammie, MD  oxyCODONE (ROXICODONE) 5 MG immediate release tablet Take 1 tablet (5 mg total) by mouth every 4 (four) hours as needed. 07/27/19   Angelia Mould, MD  polyethylene glycol powder [facility]) 17 GM/SCOOP powder Take by mouth. 11/05/17   [provider]  predniSONE (DELTASONE) 20 MG tablet Take 20 mg by mouth 2 (two) times daily.    [provider]    Allergies    Morphine, Peanut-containing drug products, and Chocolate  Review of Systems   Review of Systems  Constitutional: Negative for chills and fever.  HENT: Positive for rhinorrhea and sneezing.   Respiratory: Positive for cough. Negative for shortness of breath and wheezing.   Cardiovascular: Positive for chest pain (rib).  All other systems reviewed and are negative.   Physical Exam Updated Vital Signs BP (!) 198/89 (BP Location: Left Arm)   Pulse 89   Temp 98.3 F (36.8 C) (Oral)   Resp 17   Ht '5\' 1"'  (1.549 m)   Wt 63.5 kg   LMP 01/14/2020   SpO2 99%   BMI 26.45 kg/m   Physical Exam Vitals and nursing note reviewed.  Constitutional:      General: She is not in acute distress.    Appearance: Normal appearance. She is well-developed. She is not ill-appearing.     Comments: Calm, cooperative. NAD  HENT:       Head: Normocephalic and atraumatic.  Eyes:     General: No scleral icterus.       Right eye: No discharge.        Left eye: No discharge.     Conjunctiva/sclera: Conjunctivae normal.     Pupils: Pupils are equal, round, and reactive to light.  Cardiovascular:     Rate and Rhythm: Normal rate and regular rhythm.  Pulmonary:     Effort: Pulmonary effort is normal. No respiratory distress.     Breath sounds: Normal breath sounds.  Chest:     Chest wall: No tenderness.  Abdominal:     General: There is no distension.  Musculoskeletal:     Cervical back: Normal range of motion.  Right lower leg: No edema.     Left lower leg: No edema.  Skin:    General: Skin is warm and dry.  Neurological:     Mental Status: She is alert and oriented to person, place, and time.  Psychiatric:        Behavior: Behavior normal.     ED Results / Procedures / Treatments   Labs (all labs ordered are listed, but only abnormal results are displayed) Labs Reviewed  BASIC METABOLIC PANEL - Abnormal; Notable for the following components:      Result Value   Potassium 5.2 (*)    Chloride 96 (*)    Glucose, Bld 230 (*)    BUN 28 (*)    Creatinine, Ser 7.82 (*)    GFR calc non Af Amer 6 (*)    GFR calc Af Amer 7 (*)    All other components within normal limits  CBC WITH DIFFERENTIAL/PLATELET - Abnormal; Notable for the following components:   Hemoglobin 11.5 (*)    Eosinophils Absolute 0.7 (*)    All other components within normal limits    EKG None  Radiology DG Chest 2 View  Result Date: 02/05/2020 CLINICAL DATA:  Cough rib pain. EXAM: CHEST - 2 VIEW COMPARISON:  07/27/2019 FINDINGS: Heart mediastinal contours are normal.  Hilar structures are normal. Lungs are clear.  No sign of pleural effusion. Visualized skeletal structures on limited assessment are unremarkable. IMPRESSION: Normal chest. Electronically Signed   By: Zetta Bills M.D.   On: 02/05/2020 09:03     Procedures Procedures (including critical care time)  Medications Ordered in ED Medications  benzonatate (TESSALON) capsule 100 mg (100 mg Oral Given 02/05/20 0952)  HYDROcodone-acetaminophen (NORCO/VICODIN) 5-325 MG per tablet 1 tablet (1 tablet Oral Given 02/05/20 6948)    ED Course  I have reviewed the triage vital signs and the nursing notes.  Pertinent labs & imaging results that were available during my care of the patient were reviewed by me and considered in my medical decision making (see chart for details).  39 year old female with diabetes and end-stage renal disease on dialysis presents with cough and rib pain with coughing which has been going on for about 1 week.  Her blood pressure is elevated but otherwise vital signs are normal.  She is not ill-appearing.  Heart is regular rate and rhythm.  Lungs are clear to auscultation.  Tenderness is not reproduced with chest palpation.  Chest x-ray was obtained and is negative.  Blood work is overall stable appearing and consistent with her end-stage renal disease. Possible etiologies include allergies vs URI. She was offered COVID testing but pt declined. Will rx cough medicine and encourage f/u for dialysis tomorrow.  Tannia Contino was evaluated in Emergency Department on 02/05/2020 for the symptoms described in the history of present illness. She was evaluated in the context of the global COVID-19 pandemic, which necessitated consideration that the patient might be at risk for infection with the SARS-CoV-2 virus that causes COVID-19. Institutional protocols and algorithms that pertain to the evaluation of patients at risk for COVID-19 are in a state of rapid change based on information released by regulatory bodies including the CDC and federal and state organizations. These policies and algorithms were followed during the patient's care in the ED.  MDM Rules/Calculators/A&P                          Final Clinical Impression(s) / ED  Diagnoses Final diagnoses:  Cough  Chest wall pain    Rx / DC Orders ED Discharge Orders    None       Recardo Evangelist, PA-C 02/05/20 1117    Quintella Reichert, MD 02/08/20 [phone]

## 2020-02-05 NOTE — Discharge Instructions (Signed)
Take Tessalon for cough every 8 hours as needed Take Tylenol and use topical cream for pain on the chest as needed Please follow up for your dialysis session tomorrow Return to the ER for worsening symptoms

## 2020-02-05 NOTE — ED Triage Notes (Signed)
Pt reports non productive cough for the past week, states when she coughs she feels pain in her right ribcage. Pt receives dialysis t/th/sat, last treatment yesterday. Resp e.u

## 2020-02-12 ENCOUNTER — Ambulatory Visit: Payer: Medicaid Other | Admitting: Internal Medicine

## 2020-02-17 ENCOUNTER — Ambulatory Visit: Payer: Medicaid Other | Admitting: Internal Medicine

## 2020-04-21 DIAGNOSIS — T7840XD Allergy, unspecified, subsequent encounter: Secondary | ICD-10-CM | POA: Insufficient documentation

## 2020-04-21 DIAGNOSIS — T782XXD Anaphylactic shock, unspecified, subsequent encounter: Secondary | ICD-10-CM | POA: Insufficient documentation

## 2020-05-11 ENCOUNTER — Telehealth: Payer: Self-pay | Admitting: *Deleted

## 2020-05-11 ENCOUNTER — Other Ambulatory Visit: Payer: Self-pay

## 2020-05-11 ENCOUNTER — Ambulatory Visit: Payer: Medicaid Other | Attending: Physician Assistant | Admitting: Physician Assistant

## 2020-05-11 ENCOUNTER — Encounter: Payer: Self-pay | Admitting: Physician Assistant

## 2020-05-11 DIAGNOSIS — E785 Hyperlipidemia, unspecified: Secondary | ICD-10-CM

## 2020-05-11 DIAGNOSIS — E1122 Type 2 diabetes mellitus with diabetic chronic kidney disease: Secondary | ICD-10-CM

## 2020-05-11 DIAGNOSIS — Z992 Dependence on renal dialysis: Secondary | ICD-10-CM

## 2020-05-11 DIAGNOSIS — Z794 Long term (current) use of insulin: Secondary | ICD-10-CM

## 2020-05-11 DIAGNOSIS — N186 End stage renal disease: Secondary | ICD-10-CM | POA: Diagnosis not present

## 2020-05-11 MED ORDER — INSULIN ASPART 100 UNIT/ML ~~LOC~~ SOLN: 5.0000 [IU] | Freq: Three times a day (TID) | SUBCUTANEOUS | 1 refills | Status: DC

## 2020-05-11 MED ORDER — INSULIN SYRINGES (DISPOSABLE) U-100 0.5 ML MISC: 3 refills | Status: DC

## 2020-05-11 MED ORDER — INSULIN GLARGINE 100 UNIT/ML ~~LOC~~ SOLN: 30.0000 [IU] | Freq: Every day | SUBCUTANEOUS | 1 refills | Status: DC | PRN

## 2020-05-11 MED ORDER — PEN NEEDLES 30G X 5 MM MISC: 1.0000 | Freq: Three times a day (TID) | 11 refills | Status: DC

## 2020-05-11 NOTE — Progress Notes (Signed)
Virtual Visit via Telephone Note  I connected with [name] on 05/11/20 at  9:50 AM EDT by telephone and verified that I am speaking with the correct person using two identifiers.   I discussed the limitations, risks, security and privacy concerns of performing an evaluation and management service by telephone and the availability of in person appointments. I also discussed with the patient that there may be a patient responsible charge related to this service. The patient expressed understanding and agreed to proceed.   PATIENT visit by telephone virtually in the context of Covid-19 pandemic. Patient location:  home My Location:  [place] office Persons on the call:  Me and the patient    History of Present Illness:  I had requested to see the patient in the clinic today but she did not have transportation.  She says her blood sugars are [name] 180-200s.  She is only taking 20 units of lantus and 5 of her quick acting.  She is prescribed 30 of lantus.  She needs med RF and has agreed to come for labs in 1 week.     Observations/Objective:  NAD.  A&Ox3   Assessment and Plan: 1. Type 2 diabetes mellitus with chronic kidney disease on chronic dialysis, with long-term current use of insulin ([place]) Doubt controlled-appt scheduled for ~10:10 Wednesday, Oct 6.   - insulin glargine (LANTUS) 100 UNIT/ML injection; Inject 0.3 mLs (30 Units total) into the skin daily as needed (blood sugar over 200).  Dispense: 10 mL; Refill: 1 - insulin aspart (NOVOLOG) 100 UNIT/ML injection; Inject 5-10 Units into the skin 3 (three) times daily with meals.  Dispense: 10 mL; Refill: 1 - Hemoglobin A1c; Future - Comprehensive metabolic panel; Future - CBC with Differential/Platelet; Future - Insulin Syringes, Disposable, U-100 0.5 ML MISC; Use as directed  Dispense: 100 each; Refill: 3 - Insulin Pen Needle (PEN NEEDLES) 30G X 5 MM MISC; 1 each by Does not apply route 3 (three) times daily.  Dispense: 100 each;  Refill: 11  2. Hyperlipidemia, unspecified hyperlipidemia type - Lipid panel; Future - CBC with Differential/Platelet; Future    Follow Up Instructions: See PCP in 2-3 months;  Must KEEP lab appt   I discussed the assessment and treatment plan with the patient. The patient was provided an opportunity to ask questions and all were answered. The patient agreed with the plan and demonstrated an understanding of the instructions.   The patient was advised to call back or seek an in-person evaluation if the symptoms worsen or if the condition fails to improve as anticipated.  I provided  13 minutes of non-face-to-face time during this encounter.   Freeman Caldron, PA-C  Patient ID: [name], female   DOB: November 16, 1980, 39 y.o.   MRN: [id number]

## 2020-05-11 NOTE — Telephone Encounter (Signed)
Attempt x 3  to call patient to make a f/u OV and establish care with PCP. LVM to return call. Per Freeman Caldron, PA-C,   Ms Skillen should see her PCP in about 8 weeks.

## 2020-05-13 ENCOUNTER — Other Ambulatory Visit: Payer: Self-pay

## 2020-05-13 ENCOUNTER — Encounter: Payer: Self-pay | Admitting: Internal Medicine

## 2020-05-13 ENCOUNTER — Ambulatory Visit (INDEPENDENT_AMBULATORY_CARE_PROVIDER_SITE_OTHER): Payer: Medicaid Other | Admitting: Internal Medicine

## 2020-05-13 VITALS — BP 162/90 | HR 65 | Ht 61.0 in | Wt 152.0 lb

## 2020-05-13 DIAGNOSIS — E1165 Type 2 diabetes mellitus with hyperglycemia: Secondary | ICD-10-CM | POA: Insufficient documentation

## 2020-05-13 DIAGNOSIS — N186 End stage renal disease: Secondary | ICD-10-CM | POA: Diagnosis not present

## 2020-05-13 DIAGNOSIS — Z794 Long term (current) use of insulin: Secondary | ICD-10-CM

## 2020-05-13 DIAGNOSIS — E113599 Type 2 diabetes mellitus with proliferative diabetic retinopathy without macular edema, unspecified eye: Secondary | ICD-10-CM | POA: Diagnosis not present

## 2020-05-13 DIAGNOSIS — E1122 Type 2 diabetes mellitus with diabetic chronic kidney disease: Secondary | ICD-10-CM

## 2020-05-13 DIAGNOSIS — Z992 Dependence on renal dialysis: Secondary | ICD-10-CM | POA: Diagnosis not present

## 2020-05-13 LAB — POCT GLYCOSYLATED HEMOGLOBIN (HGB A1C): Hemoglobin A1C: 11.2 % — AB (ref 4.0–5.6)

## 2020-05-13 LAB — POCT GLUCOSE (DEVICE FOR HOME USE): POC Glucose: 333 mg/dl — AB (ref 70–99)

## 2020-05-13 MED ORDER — LANTUS SOLOSTAR 100 UNIT/ML ~~LOC~~ SOPN: 20.0000 [IU] | PEN_INJECTOR | Freq: Every day | SUBCUTANEOUS | 11 refills | Status: DC

## 2020-05-13 MED ORDER — NOVOLOG FLEXPEN 100 UNIT/ML ~~LOC~~ SOPN: 5.0000 [IU] | PEN_INJECTOR | Freq: Three times a day (TID) | SUBCUTANEOUS | 11 refills | Status: DC

## 2020-05-13 MED ORDER — INSULIN PEN NEEDLE 32G X 4 MM MISC: 1.0000 | 11 refills | Status: DC

## 2020-05-13 NOTE — Progress Notes (Signed)
Name: [name]  MRN/ DOB: [id number], Sep 12, 1980   Age/ Sex: 39 y.o., female    PCP: [facility]   Reason for Endocrinology Evaluation: Type 2 Diabetes Mellitus     Date of Initial Endocrinology Visit: 05/13/2020     PATIENT IDENTIFIER: Ms. Tonique Mendonca is a 39 y.o. female with a past medical history of T2DM, ESRD on HD ( 2016)  and PVD. The patient presented for initial endocrinology clinic visit on 05/13/2020 for consultative assistance with her diabetes management.    HPI: Ms. Nottingham was    Diagnosed with DM since the age of 54  Prior Medications tried/Intolerance: has been on insulin for year Currently checking blood sugars 1 x / day Hypoglycemia episodes : yes               Symptoms: sweating and shaky                Frequency: daily  Hemoglobin A1c has ranged from 11.2% in 2021, peaking at 11.3% in 2020. Patient required assistance for hypoglycemia: yes in 2020 Patient has required hospitalization within the last 1 year from hyper or hypoglycemia: no  In terms of diet, the patient eats 3 meals and snacks all day. Drinks regular sodas.   Has polydipsia and diarrhea after dialysis   HOME DIABETES REGIMEN: Lantus 30 units daily  Novolog 5 units daily   Statin: no ACE-I/ARB: no. ESRD on HD Prior Diabetic Education: yes   METER DOWNLOAD SUMMARY: Did not bring    DIABETIC COMPLICATIONS: Microvascular complications:   Retinopathy ( blind right eye), ESRD on HD  Denies: neuropathy Last eye exam: Completed 09/28/2019 Macrovascular complications:    Denies: CAD, CVA, PVD   PAST HISTORY: Past Medical History:  Past Medical History:  Diagnosis Date  . Anemia   . Anxiety   . Blind right eye 2008  . Diabetes mellitus without complication ([place])   . Dialysis patient ([place])   . ESRD (end stage renal disease) [facility])    Dialysis T/Th/Sa   Past Surgical History:  Past Surgical History:  Procedure Laterality Date  . AMPUTATION TOE Left    . AV FISTULA PLACEMENT    . CESAREAN SECTION  2011  . FISTULA SUPERFICIALIZATION Right 58/59/2924   Procedure: 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;  Surgeon: Angelia Mould, MD;  Location: [place];  Service: Vascular;  Laterality: Right;  . INSERTION OF DIALYSIS CATHETER Left 07/27/2019   Procedure: INSERTION OF TUNNELED  DIALYSIS CATHETER;  Surgeon: Angelia Mould, MD;  Location: [place];  Service: Vascular;  Laterality: Left;      Social History:  reports that she has never smoked. She has never used smokeless tobacco. She reports that she does not drink alcohol and does not use drugs. Family History:  Family History  Problem Relation Age of Onset  . Diabetes Mother   . Diabetes Father      HOME MEDICATIONS: Allergies as of 05/13/2020      Reactions   Morphine Rash, Other (See Comments)   Peanut-containing Drug Products Anaphylaxis, Hives   Penicillins Anaphylaxis   Chocolate Hives      Medication List       Accurate as of May 13, 2020 10:29 AM. If you have any questions, ask your nurse or doctor.        STOP taking these medications   insulin aspart 100 UNIT/ML injection Commonly known as: NovoLOG Replaced by: NovoLOG FlexPen 100  UNIT/ML FlexPen Stopped by: Dorita Sciara, MD   insulin glargine 100 UNIT/ML injection Commonly known as: Lantus Replaced by: Lantus SoloStar 100 UNIT/ML Solostar Pen Stopped by: Dorita Sciara, MD   Insulin Syringes (Disposable) U-100 0.5 ML Misc Stopped by: Dorita Sciara, MD     TAKE these medications   Accu-Chek FastClix Lancets Misc Use as instructed to check blood sugar up to TID. E11.22   Accu-Chek Guide Me w/Device Kit 1 kit by Does not apply route 3 (three) times daily. Use to check BG at home up to 3 times daily. E11.22   Accu-Chek Guide test strip Generic drug: glucose blood Use as instructed to check blood sugar up to TID. E11.22   ARANESP (ALBUMIN FREE)  IJ Darbepoetin Alfa (Aranesp)   calcitRIOL 0.25 MCG capsule Commonly known as: ROCALTROL Take 0.25 mcg by mouth 2 (two) times daily.   Fosrenol 1000 MG Pack Generic drug: Lanthanum Carbonate Take 1,000 mg by mouth See admin instructions. Take 1000 mg by mouth with meals and snacks   HAIR/SKIN/NAILS PO Take 1 capsule by mouth daily.   Insulin Pen Needle 32G X 4 MM Misc 1 Device by Does not apply route as directed. What changed:   medication strength  how much to take  when to take this Changed by: Dorita Sciara, MD   Lantus SoloStar 100 UNIT/ML Solostar Pen Generic drug: insulin glargine Inject 20 Units into the skin daily. Replaces: insulin glargine 100 UNIT/ML injection Started by: Dorita Sciara, MD   lidocaine-prilocaine cream Commonly known as: EMLA Apply 1 application topically as directed.   multivitamin Tabs tablet Take 1 tablet by mouth daily.   NovoLOG FlexPen 100 UNIT/ML FlexPen Generic drug: insulin aspart Inject 5 Units into the skin 3 (three) times daily with meals. Max daily 40 units with correction scale Replaces: insulin aspart 100 UNIT/ML injection Started by: Dorita Sciara, MD   Sensipar 90 MG tablet Generic drug: cinacalcet Take 90 mg by mouth daily.   sevelamer carbonate 0.8 g Pack packet Commonly known as: RENVELA Take 0.8 g by mouth 3 (three) times daily with meals.        ALLERGIES: Allergies  Allergen Reactions  . Morphine Rash and Other (See Comments)  . Peanut-Containing Drug Products Anaphylaxis and Hives  . Penicillins Anaphylaxis  . Chocolate Hives     REVIEW OF SYSTEMS: A comprehensive ROS was conducted with the patient and is negative except as per HPI     OBJECTIVE:   VITAL SIGNS: BP (!) 162/90 (BP Location: Left Arm, Patient Position: Sitting, Cuff Size: Small)   Pulse 65   Ht _0  (1.549 m)   Wt 152 lb (68.9 kg)   LMP 04/13/2020   SpO2 99%   BMI 28.72 kg/m    PHYSICAL EXAM:   General: Pt appears well and is in NAD  Neck: General: Supple without adenopathy or carotid bruits. Thyroid: Thyroid size normal.  No goiter or nodules appreciated.   Lungs: Clear with good BS bilat with no rales, rhonchi, or wheezes  Heart: RRR   Abdomen: Normoactive bowel sounds, soft, nontender, without masses or organomegaly palpable  Extremities:  Lower extremities - No pretibial edema. No lesions.  Skin: Normal texture and temperature to palpation. No rash noted.   Neuro: MS is good with appropriate affect, pt is alert and Ox3      DATA REVIEWED:  Lab Results  Component Value Date   HGBA1C 11.2 (A) 05/13/2020   HGBA1C  11.3 (A) 07/17/2019   Lab Results  Component Value Date   CREATININE 7.82 (H) 02/05/2020   Lab Results  Component Value Date   MICRALBCREAT 958 (H) 07/17/2019    Lab Results  Component Value Date   TRIG 134 02/21/2019        ASSESSMENT / PLAN / RECOMMENDATIONS:   1) Type 2 Diabetes Mellitus, poorly controlled, With retinopathy and ESRD on HD complications - Most recent A1c of 11.2 %. Goal A1c < 7.0 %.     Pt with retinopathic complication and ESRD on HD. We discussed the importance of glycemic control to slow down progression of retinopathy and especially if she is trying to get on transplant list. We discussed importance of glycemic control even after transplant list   Pt is dependent on long acting insulin but does not use prandial insulin due to history of hypoglycemia   We discussed the importance of avoiding sugar sweetened beverages and avoiding snacks  She is not a candidate for insulin pumps due to visual impairment.  She is interested in CGM, I am not sure how she will be able to see these numbers but I have advised her to check her BG's 4 times daily and bring those records so we can order a Dexcom  She is interested in switching from insulin vials to insulin pens  I am going to adjust her insulin regimen as below  MEDICATIONS: -  Decrease lantus to 20 units daily  - Continue Novolog 5 units with each meal  -CF: NovoLog (BG -130/45)  EDUCATION / INSTRUCTIONS:  BG monitoring instructions: Patient is instructed to check her blood sugars 4 times a day  Call [place] [facility] clinic if: BG persistently < 70  . I reviewed the Rule of 15 for the treatment of hypoglycemia in detail with the patient. Literature supplied.   2) Diabetic complications:   Eye: Does have known diabetic retinopathy.   Neuro/ Feet: Does not have known diabetic peripheral neuropathy.  Renal: Patient does have known baseline ESRD on HD. .      Signed electronically by: Mack Guise, MD  [facility] Endocrinology  [facility] [address] [place], [place] [address] Phone: [phone] FAX: [phone]   [place], [facility] [place] [address] Phone: [phone]  Fax: [phone]    Return to Endocrinology clinic as below: Future Appointments  Date Time Provider [place]  05/13/2020 10:50 AM Osmani Kersten, Melanie Crazier, MD [facility] None  05/18/2020 10:00 AM [facility] None  08/19/2020 10:30 AM Uzoma Vivona, Melanie Crazier, MD [facility] None

## 2020-05-13 NOTE — Patient Instructions (Signed)
-   Decrease lantus to 20 units daily  - Continue Novolog 5 units with each meal  -Novolog correctional insulin: ADD extra units on insulin to your meal-time Novolog dose if your blood sugars are higher than 175 . Use the scale below to help guide you:   Blood sugar before meal Number of units to inject  Less than 175 0 unit  176 - 220 1 units  221 -  265 2 units  266 -  310 3 units  311 -  355 4 units  356 -  400 5 units  401 -  445 6 units  446 -  490 7 units  491 -  535 8 units          HOW TO TREAT LOW BLOOD SUGARS (Blood sugar LESS THAN 70 MG/DL)  Please follow the RULE OF 15 for the treatment of hypoglycemia treatment (when your (blood sugars are less than 70 mg/dL)    STEP 1: Take 15 grams of carbohydrates when your blood sugar is low, which includes:   3-4 GLUCOSE TABS  OR  3-4 OZ OF JUICE OR REGULAR SODA OR  ONE TUBE OF GLUCOSE GEL     STEP 2: RECHECK blood sugar in 15 MINUTES STEP 3: If your blood sugar is still low at the 15 minute recheck --> then, go back to STEP 1 and treat AGAIN with another 15 grams of carbohydrates.

## 2020-05-18 ENCOUNTER — Ambulatory Visit: Payer: Medicaid Other | Attending: Family Medicine

## 2020-05-18 ENCOUNTER — Other Ambulatory Visit: Payer: Self-pay

## 2020-05-18 DIAGNOSIS — E1122 Type 2 diabetes mellitus with diabetic chronic kidney disease: Secondary | ICD-10-CM

## 2020-05-18 DIAGNOSIS — E785 Hyperlipidemia, unspecified: Secondary | ICD-10-CM

## 2020-05-19 LAB — CBC WITH DIFFERENTIAL/PLATELET
Basophils Absolute: 0 10*3/uL (ref 0.0–0.2)
Basos: 1 %
EOS (ABSOLUTE): 0.6 10*3/uL — ABNORMAL HIGH (ref 0.0–0.4)
Eos: 11 %
Hematocrit: 35.1 % (ref 34.0–46.6)
Hemoglobin: 10.6 g/dL — ABNORMAL LOW (ref 11.1–15.9)
Immature Grans (Abs): 0 10*3/uL (ref 0.0–0.1)
Immature Granulocytes: 0 %
Lymphocytes Absolute: 1.6 10*3/uL (ref 0.7–3.1)
Lymphs: 27 %
MCH: 26.8 pg (ref 26.6–33.0)
MCHC: 30.2 g/dL — ABNORMAL LOW (ref 31.5–35.7)
MCV: 89 fL (ref 79–97)
Monocytes Absolute: 0.4 10*3/uL (ref 0.1–0.9)
Monocytes: 7 %
Neutrophils Absolute: 3.2 10*3/uL (ref 1.4–7.0)
Neutrophils: 54 %
Platelets: 186 10*3/uL (ref 150–450)
RBC: 3.95 x10E6/uL (ref 3.77–5.28)
RDW: 13.9 % (ref 11.7–15.4)
WBC: 5.8 10*3/uL (ref 3.4–10.8)

## 2020-05-19 LAB — HEMOGLOBIN A1C
Est. average glucose Bld gHb Est-mCnc: 280 mg/dL
Hgb A1c MFr Bld: 11.4 % — ABNORMAL HIGH (ref 4.8–5.6)

## 2020-05-19 LAB — COMPREHENSIVE METABOLIC PANEL
ALT: 6 IU/L (ref 0–32)
AST: 7 IU/L (ref 0–40)
Albumin/Globulin Ratio: 1.1 — ABNORMAL LOW (ref 1.2–2.2)
Albumin: 3.9 g/dL (ref 3.8–4.8)
Alkaline Phosphatase: 152 IU/L — ABNORMAL HIGH (ref 44–121)
BUN/Creatinine Ratio: 5 — ABNORMAL LOW (ref 9–23)
BUN: 45 mg/dL — ABNORMAL HIGH (ref 6–20)
Bilirubin Total: <0.2 mg/dL (ref 0.0–1.2)
CO2: 25 mmol/L (ref 20–29)
Calcium: 9.6 mg/dL (ref 8.7–10.2)
Chloride: 92 mmol/L — ABNORMAL LOW (ref 96–106)
Creatinine, Ser: 8.22 mg/dL — ABNORMAL HIGH (ref 0.57–1.00)
GFR calc Af Amer: 6 mL/min/{1.73_m2} — ABNORMAL LOW (ref >59)
GFR calc non Af Amer: 6 mL/min/{1.73_m2} — ABNORMAL LOW (ref >59)
Globulin, Total: 3.7 g/dL (ref 1.5–4.5)
Glucose: 428 mg/dL — ABNORMAL HIGH (ref 65–99)
Potassium: 5.2 mmol/L (ref 3.5–5.2)
Sodium: 133 mmol/L — ABNORMAL LOW (ref 134–144)
Total Protein: 7.6 g/dL (ref 6.0–8.5)

## 2020-05-19 LAB — LIPID PANEL
Chol/HDL Ratio: 3.5 ratio (ref 0.0–4.4)
Cholesterol, Total: 195 mg/dL (ref 100–199)
HDL: 55 mg/dL (ref >39)
LDL Chol Calc (NIH): 125 mg/dL — ABNORMAL HIGH (ref 0–99)
Triglycerides: 84 mg/dL (ref 0–149)
VLDL Cholesterol Cal: 15 mg/dL (ref 5–40)

## 2020-06-30 ENCOUNTER — Encounter (HOSPITAL_COMMUNITY): Payer: Self-pay | Admitting: Internal Medicine

## 2020-06-30 ENCOUNTER — Other Ambulatory Visit: Payer: Self-pay

## 2020-06-30 ENCOUNTER — Observation Stay (HOSPITAL_COMMUNITY): Payer: Medicaid Other

## 2020-06-30 ENCOUNTER — Emergency Department (HOSPITAL_COMMUNITY): Payer: Medicaid Other

## 2020-06-30 ENCOUNTER — Inpatient Hospital Stay (HOSPITAL_COMMUNITY)
Admission: EM | Admit: 2020-06-30 | Discharge: 2020-07-02 | DRG: 077 | Disposition: A | Discharge status: Home / Self Care | Payer: Medicaid Other | Attending: Internal Medicine | Admitting: Internal Medicine

## 2020-06-30 DIAGNOSIS — G9340 Encephalopathy, unspecified: Secondary | ICD-10-CM

## 2020-06-30 DIAGNOSIS — N2581 Secondary hyperparathyroidism of renal origin: Secondary | ICD-10-CM | POA: Diagnosis present

## 2020-06-30 DIAGNOSIS — E1122 Type 2 diabetes mellitus with diabetic chronic kidney disease: Secondary | ICD-10-CM | POA: Diagnosis present

## 2020-06-30 DIAGNOSIS — I160 Hypertensive urgency: Secondary | ICD-10-CM | POA: Diagnosis not present

## 2020-06-30 DIAGNOSIS — Z992 Dependence on renal dialysis: Secondary | ICD-10-CM

## 2020-06-30 DIAGNOSIS — I120 Hypertensive chronic kidney disease with stage 5 chronic kidney disease or end stage renal disease: Secondary | ICD-10-CM | POA: Diagnosis present

## 2020-06-30 DIAGNOSIS — E785 Hyperlipidemia, unspecified: Secondary | ICD-10-CM | POA: Diagnosis present

## 2020-06-30 DIAGNOSIS — Z833 Family history of diabetes mellitus: Secondary | ICD-10-CM

## 2020-06-30 DIAGNOSIS — Z20822 Contact with and (suspected) exposure to covid-19: Secondary | ICD-10-CM | POA: Diagnosis present

## 2020-06-30 DIAGNOSIS — H5440 Blindness, one eye, unspecified eye: Secondary | ICD-10-CM | POA: Diagnosis present

## 2020-06-30 DIAGNOSIS — Z880 Allergy status to penicillin: Secondary | ICD-10-CM

## 2020-06-30 DIAGNOSIS — G928 Other toxic encephalopathy: Secondary | ICD-10-CM

## 2020-06-30 DIAGNOSIS — Z885 Allergy status to narcotic agent status: Secondary | ICD-10-CM

## 2020-06-30 DIAGNOSIS — N186 End stage renal disease: Secondary | ICD-10-CM

## 2020-06-30 DIAGNOSIS — E8770 Fluid overload, unspecified: Secondary | ICD-10-CM

## 2020-06-30 DIAGNOSIS — Z89421 Acquired absence of other right toe(s): Secondary | ICD-10-CM

## 2020-06-30 DIAGNOSIS — Z91018 Allergy to other foods: Secondary | ICD-10-CM

## 2020-06-30 DIAGNOSIS — D638 Anemia in other chronic diseases classified elsewhere: Secondary | ICD-10-CM | POA: Diagnosis not present

## 2020-06-30 DIAGNOSIS — Z794 Long term (current) use of insulin: Secondary | ICD-10-CM

## 2020-06-30 DIAGNOSIS — D631 Anemia in chronic kidney disease: Secondary | ICD-10-CM | POA: Diagnosis present

## 2020-06-30 DIAGNOSIS — Z79899 Other long term (current) drug therapy: Secondary | ICD-10-CM

## 2020-06-30 DIAGNOSIS — E113599 Type 2 diabetes mellitus with proliferative diabetic retinopathy without macular edema, unspecified eye: Secondary | ICD-10-CM

## 2020-06-30 DIAGNOSIS — Z8673 Personal history of transient ischemic attack (TIA), and cerebral infarction without residual deficits: Secondary | ICD-10-CM

## 2020-06-30 DIAGNOSIS — Z91010 Allergy to peanuts: Secondary | ICD-10-CM

## 2020-06-30 DIAGNOSIS — H5461 Unqualified visual loss, right eye, normal vision left eye: Secondary | ICD-10-CM | POA: Diagnosis present

## 2020-06-30 DIAGNOSIS — I674 Hypertensive encephalopathy: Principal | ICD-10-CM | POA: Diagnosis present

## 2020-06-30 DIAGNOSIS — G9341 Metabolic encephalopathy: Secondary | ICD-10-CM

## 2020-06-30 DIAGNOSIS — M898X9 Other specified disorders of bone, unspecified site: Secondary | ICD-10-CM | POA: Diagnosis present

## 2020-06-30 LAB — CBC
HCT: 32.1 % — ABNORMAL LOW (ref 36.0–46.0)
HCT: 29.6 % — ABNORMAL LOW (ref 36.0–46.0)
Hemoglobin: 9.1 g/dL — ABNORMAL LOW (ref 12.0–15.0)
Hemoglobin: 9.7 g/dL — ABNORMAL LOW (ref 12.0–15.0)
MCH: 28.8 pg (ref 26.0–34.0)
MCH: 29 pg (ref 26.0–34.0)
MCHC: 28.3 g/dL — ABNORMAL LOW (ref 30.0–36.0)
MCHC: 32.8 g/dL (ref 30.0–36.0)
MCV: 101.6 fL — ABNORMAL HIGH (ref 80.0–100.0)
MCV: 88.6 fL (ref 80.0–100.0)
Platelets: 163 10*3/uL (ref 150–400)
Platelets: 151 10*3/uL (ref 150–400)
RBC: 3.16 MIL/uL — ABNORMAL LOW (ref 3.87–5.11)
RBC: 3.34 MIL/uL — ABNORMAL LOW (ref 3.87–5.11)
RDW: 15.3 % (ref 11.5–15.5)
RDW: 15.6 % — ABNORMAL HIGH (ref 11.5–15.5)
WBC: 6.7 10*3/uL (ref 4.0–10.5)
WBC: 6.7 10*3/uL (ref 4.0–10.5)
nRBC: 0.3 % — ABNORMAL HIGH (ref 0.0–0.2)
nRBC: 0.3 % — ABNORMAL HIGH (ref 0.0–0.2)

## 2020-06-30 LAB — COMPREHENSIVE METABOLIC PANEL
ALT: 12 U/L (ref 0–44)
ALT: 12 U/L (ref 0–44)
AST: 17 U/L (ref 15–41)
AST: 16 U/L (ref 15–41)
Albumin: 3.3 g/dL — ABNORMAL LOW (ref 3.5–5.0)
Albumin: 3.2 g/dL — ABNORMAL LOW (ref 3.5–5.0)
Alkaline Phosphatase: 105 U/L (ref 38–126)
Alkaline Phosphatase: 117 U/L (ref 38–126)
Anion gap: 16 — ABNORMAL HIGH (ref 5–15)
Anion gap: 14 (ref 5–15)
BUN: 47 mg/dL — ABNORMAL HIGH (ref 6–20)
BUN: 56 mg/dL — ABNORMAL HIGH (ref 6–20)
CO2: 30 mmol/L (ref 22–32)
CO2: 31 mmol/L (ref 22–32)
Calcium: 9.9 mg/dL (ref 8.9–10.3)
Calcium: 9.3 mg/dL (ref 8.9–10.3)
Chloride: 89 mmol/L — ABNORMAL LOW (ref 98–111)
Chloride: 91 mmol/L — ABNORMAL LOW (ref 98–111)
Creatinine, Ser: 10.06 mg/dL — ABNORMAL HIGH (ref 0.44–1.00)
Creatinine, Ser: 11.16 mg/dL — ABNORMAL HIGH (ref 0.44–1.00)
GFR, Estimated: 5 mL/min — ABNORMAL LOW (ref >60)
GFR, Estimated: 4 mL/min — ABNORMAL LOW (ref >60)
Glucose, Bld: 416 mg/dL — ABNORMAL HIGH (ref 70–99)
Glucose, Bld: 433 mg/dL — ABNORMAL HIGH (ref 70–99)
Potassium: 3.4 mmol/L — ABNORMAL LOW (ref 3.5–5.1)
Potassium: 3.9 mmol/L (ref 3.5–5.1)
Sodium: 135 mmol/L (ref 135–145)
Sodium: 136 mmol/L (ref 135–145)
Total Bilirubin: 0.4 mg/dL (ref 0.3–1.2)
Total Bilirubin: 0.6 mg/dL (ref 0.3–1.2)
Total Protein: 7.2 g/dL (ref 6.5–8.1)
Total Protein: 7.1 g/dL (ref 6.5–8.1)

## 2020-06-30 LAB — GLUCOSE, CAPILLARY
Glucose-Capillary: 393 mg/dL — ABNORMAL HIGH (ref 70–99)
Glucose-Capillary: 246 mg/dL — ABNORMAL HIGH (ref 70–99)
Glucose-Capillary: 261 mg/dL — ABNORMAL HIGH (ref 70–99)

## 2020-06-30 LAB — I-STAT CHEM 8, ED
BUN: 45 mg/dL — ABNORMAL HIGH (ref 6–20)
Calcium, Ion: 1.12 mmol/L — ABNORMAL LOW (ref 1.15–1.40)
Chloride: 91 mmol/L — ABNORMAL LOW (ref 98–111)
Creatinine, Ser: 10.2 mg/dL — ABNORMAL HIGH (ref 0.44–1.00)
Glucose, Bld: 405 mg/dL — ABNORMAL HIGH (ref 70–99)
HCT: 30 % — ABNORMAL LOW (ref 36.0–46.0)
Hemoglobin: 10.2 g/dL — ABNORMAL LOW (ref 12.0–15.0)
Potassium: 3.3 mmol/L — ABNORMAL LOW (ref 3.5–5.1)
Sodium: 133 mmol/L — ABNORMAL LOW (ref 135–145)
TCO2: 30 mmol/L (ref 22–32)

## 2020-06-30 LAB — DIFFERENTIAL
Abs Immature Granulocytes: 0.03 10*3/uL (ref 0.00–0.07)
Basophils Absolute: 0 10*3/uL (ref 0.0–0.1)
Basophils Relative: 0 %
Eosinophils Absolute: 0.4 10*3/uL (ref 0.0–0.5)
Eosinophils Relative: 5 %
Immature Granulocytes: 0 %
Lymphocytes Relative: 20 %
Lymphs Abs: 1.4 10*3/uL (ref 0.7–4.0)
Monocytes Absolute: 0.3 10*3/uL (ref 0.1–1.0)
Monocytes Relative: 5 %
Neutro Abs: 4.7 10*3/uL (ref 1.7–7.7)
Neutrophils Relative %: 70 %

## 2020-06-30 LAB — I-STAT BETA HCG BLOOD, ED (MC, WL, AP ONLY): I-stat hCG, quantitative: <5 m[IU]/mL (ref <5)

## 2020-06-30 LAB — TSH: TSH: 0.727 u[IU]/mL (ref 0.350–4.500)

## 2020-06-30 LAB — HEMOGLOBIN A1C
Hgb A1c MFr Bld: 9.9 % — ABNORMAL HIGH (ref 4.8–5.6)
Mean Plasma Glucose: 237.43 mg/dL

## 2020-06-30 LAB — LIPID PANEL
Cholesterol: 173 mg/dL (ref 0–200)
HDL: 60 mg/dL (ref >40)
LDL Cholesterol: 97 mg/dL (ref 0–99)
Total CHOL/HDL Ratio: 2.9 RATIO
Triglycerides: 82 mg/dL (ref <150)
VLDL: 16 mg/dL (ref 0–40)

## 2020-06-30 LAB — HIV ANTIBODY (ROUTINE TESTING W REFLEX): HIV Screen 4th Generation wRfx: NONREACTIVE

## 2020-06-30 LAB — APTT: aPTT: 29 seconds (ref 24–36)

## 2020-06-30 LAB — RESPIRATORY PANEL BY RT PCR (FLU A&B, COVID)
Influenza A by PCR: NEGATIVE
Influenza B by PCR: NEGATIVE
SARS Coronavirus 2 by RT PCR: NEGATIVE

## 2020-06-30 LAB — CBG MONITORING, ED: Glucose-Capillary: 340 mg/dL — ABNORMAL HIGH (ref 70–99)

## 2020-06-30 LAB — FOLATE: Folate: 8.8 ng/mL (ref >5.9)

## 2020-06-30 LAB — PROTIME-INR
INR: 1.1 (ref 0.8–1.2)
Prothrombin Time: 13.3 seconds (ref 11.4–15.2)

## 2020-06-30 LAB — VITAMIN B12: Vitamin B-12: 661 pg/mL (ref 180–914)

## 2020-06-30 MED ORDER — ALTEPLASE 2 MG IJ SOLR: 2.0000 mg | Freq: Once | INTRAMUSCULAR | Status: DC | PRN

## 2020-06-30 MED ORDER — HEPARIN SODIUM (PORCINE) 1000 UNIT/ML DIALYSIS: 1000.0000 [IU] | INTRAMUSCULAR | Status: DC | PRN

## 2020-06-30 MED ORDER — SODIUM CHLORIDE 0.9 % IV SOLN: 100.0000 mL | INTRAVENOUS | Status: DC | PRN

## 2020-06-30 MED ORDER — HEPARIN SODIUM (PORCINE) 1000 UNIT/ML DIALYSIS: 20.0000 [IU]/kg | INTRAMUSCULAR | Status: DC | PRN

## 2020-06-30 MED ORDER — CHLORHEXIDINE GLUCONATE CLOTH 2 % EX PADS
6.0000 | MEDICATED_PAD | Freq: Every day | CUTANEOUS | Status: DC
  Administered 2020-07-01: 6 via TOPICAL

## 2020-06-30 MED ORDER — SODIUM CHLORIDE 0.9% FLUSH
3.0000 mL | Freq: Once | INTRAVENOUS | Status: AC
  Administered 2020-07-02: 3 mL via INTRAVENOUS

## 2020-06-30 MED ORDER — INSULIN DETEMIR 100 UNIT/ML ~~LOC~~ SOLN
8.0000 [IU] | Freq: Every day | SUBCUTANEOUS | Status: DC
  Administered 2020-06-30 – 2020-07-02 (×3): 8 [IU] via SUBCUTANEOUS
  Filled 2020-06-30 (×3): qty 0.08

## 2020-06-30 MED ORDER — ACETAMINOPHEN 160 MG/5ML PO SOLN: 650.0000 mg | ORAL | Status: DC | PRN

## 2020-06-30 MED ORDER — LIDOCAINE-PRILOCAINE 2.5-2.5 % EX CREA: 1.0000 "application " | TOPICAL_CREAM | CUTANEOUS | Status: DC | PRN

## 2020-06-30 MED ORDER — HEPARIN SODIUM (PORCINE) 5000 UNIT/ML IJ SOLN
5000.0000 [IU] | Freq: Three times a day (TID) | INTRAMUSCULAR | Status: DC
  Administered 2020-06-30 – 2020-07-02 (×8): 5000 [IU] via SUBCUTANEOUS
  Filled 2020-06-30 (×8): qty 1

## 2020-06-30 MED ORDER — PENTAFLUOROPROP-TETRAFLUOROETH EX AERO: 1.0000 "application " | INHALATION_SPRAY | CUTANEOUS | Status: DC | PRN

## 2020-06-30 MED ORDER — INSULIN ASPART 100 UNIT/ML ~~LOC~~ SOLN
0.0000 [IU] | Freq: Four times a day (QID) | SUBCUTANEOUS | Status: DC
  Administered 2020-06-30: 8 [IU] via SUBCUTANEOUS
  Administered 2020-06-30: 15 [IU] via SUBCUTANEOUS
  Administered 2020-07-01: 2 [IU] via SUBCUTANEOUS

## 2020-06-30 MED ORDER — ACETAMINOPHEN 650 MG RE SUPP: 650.0000 mg | RECTAL | Status: DC | PRN

## 2020-06-30 MED ORDER — STROKE: EARLY STAGES OF RECOVERY BOOK
Freq: Once | Status: AC
  Filled 2020-06-30: qty 1

## 2020-06-30 MED ORDER — ACETAMINOPHEN 325 MG PO TABS
650.0000 mg | ORAL_TABLET | ORAL | Status: DC | PRN
  Administered 2020-07-01: 650 mg via ORAL
  Filled 2020-06-30: qty 2

## 2020-06-30 MED ORDER — CINACALCET HCL 30 MG PO TABS
90.0000 mg | ORAL_TABLET | Freq: Every day | ORAL | Status: DC
  Administered 2020-06-30 – 2020-07-02 (×3): 90 mg via ORAL
  Filled 2020-06-30 (×3): qty 3

## 2020-06-30 MED ORDER — LIDOCAINE HCL (PF) 1 % IJ SOLN: 5.0000 mL | INTRAMUSCULAR | Status: DC | PRN

## 2020-06-30 MED ORDER — HYDRALAZINE HCL 20 MG/ML IJ SOLN
10.0000 mg | Freq: Four times a day (QID) | INTRAMUSCULAR | Status: DC | PRN
  Administered 2020-06-30: 10 mg via INTRAVENOUS
  Filled 2020-06-30: qty 1

## 2020-06-30 NOTE — Progress Notes (Signed)
Same day note  Patient seen and examined at bedside.  Patient was admitted to the hospital for Thursday  At the time of my evaluation, patient is more alert awake and communicative.  Follows few commands.  Answering few questions.  Denies pain, nausea fever or chills.  Physical examination reveals obese female mildly communicative oriented to place.  Right eye blindness.  Moving extremities.  Slow on command.  Laboratory data and imaging was reviewed  Assessment and Plan.  Acute encephalopathy.  Moving all extremities.  Initially called in as a code stroke.  Nonfocal exam.  Possible metabolic/hypertensive encephalopathy.  Blood pressure was as high as 217/ 86 on presentation.  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  Continue neurochecks.  Focus on blood pressure control at this time.  No antihypertensive medications listed in the med rec.  Will need to clarify with the family.  Currently on hydralazine as needed.  End-stage renal disease on hemodialysis.  Nephrology has been notified for hemodialysis today.  Tuesday Thursday Saturday schedule.  Anemia of chronic kidney disease.  Closely monitor.  Diabetes mellitus type 2 with retinopathy.  Long-term use of insulin.  Continue sliding scale insulin, Accu-Cheks diabetic diet.  Home insulin regimen once p.o. intake.  Hemoglobin A1c of 9.9.  Was 11.52-month back.  Patient takes her Lantus 20 units daily and short acting insulin 3 times daily with meals.  Currently on sliding scale insulin and will add 8 units of Lantus today.  No Charge  Signed,  Delila Pereyra, MD [facility]

## 2020-06-30 NOTE — ED Provider Notes (Signed)
[facility] EMERGENCY DEPARTMENT Provider Note   CSN: [id number] Arrival date & time: 06/30/20  0111  An emergency department physician performed an initial assessment on this suspected stroke patient at 0111.  History Chief Complaint  Patient presents with  . Code Stroke    [name] is a 39 y.o. female.  Patient presents to the emergency department as a code stroke.  Patient started to feel "bad" approximately an hour before coming to the ER.  EMS was called to the home by her husband.  EMS report that the patient was slow to respond and seemed to be having trouble comprehending as well as speaking.  Evaluation raise concern for possible right-sided droop and right-sided weakness.  Patient is a dialysis patient, has not missed dialysis. Level V Caveat due to mental status change.        Past Medical History:  Diagnosis Date  . Anemia   . Anxiety   . Blind right eye 2008  . Diabetes mellitus without complication ([place])   . Dialysis patient ([place])   . ESRD (end stage renal disease) [facility])    Dialysis T/Th/Sa    Patient Active Problem List   Diagnosis Date Noted  . Acute encephalopathy 06/30/2020  . Hypertensive urgency 06/30/2020  . Blindness of right eye 06/30/2020  . Type 2 diabetes mellitus with hyperglycemia, with long-term current use of insulin ([place]) 05/13/2020  . Type 2 diabetes mellitus with proliferative retinopathy, with long-term current use of insulin ([place]) 05/13/2020  . Fever   . Hyperglycemia due to type 2 diabetes mellitus ([place]) 02/21/2019  . Suspected COVID-19 virus infection 02/21/2019  . ESRD on hemodialysis ([place]) 02/21/2019  . Anemia of chronic disease 02/11/2019  . Diabetes mellitus ([place]) 02/11/2019  . HLD (hyperlipidemia) 02/11/2019  . PTSD (post-traumatic stress disorder) 02/11/2019  . PVD (peripheral vascular disease) ([place]) 02/11/2019  . Retinopathy 02/11/2019  . Secondary hyperparathyroidism of renal origin ([place])  02/11/2019  . Cataracts, bilateral 01/05/2015  . Hyperphosphatemia 01/05/2015  . Status post amputation of toe of right foot ([place]) 01/05/2015    Past Surgical History:  Procedure Laterality Date  . AMPUTATION TOE Left   . AV FISTULA PLACEMENT    . CESAREAN SECTION  2011  . FISTULA SUPERFICIALIZATION Right 38/25/0539   Procedure: 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;  Surgeon: Angelia Mould, MD;  Location: [facility];  Service: Vascular;  Laterality: Right;  . INSERTION OF DIALYSIS CATHETER Left 07/27/2019   Procedure: INSERTION OF TUNNELED  DIALYSIS CATHETER;  Surgeon: Angelia Mould, MD;  Location: [facility] OR;  Service: Vascular;  Laterality: Left;     OB History   No obstetric history on file.     Family History  Problem Relation Age of Onset  . Diabetes Mother   . Diabetes Father     Social History   Tobacco Use  . Smoking status: Never Smoker  . Smokeless tobacco: Never Used  Vaping Use  . Vaping Use: Never used  Substance Use Topics  . Alcohol use: No  . Drug use: No    Home Medications Prior to Admission medications   Medication Sig Start Date End Date Taking? Authorizing Provider  Accu-Chek FastClix Lancets MISC Use as instructed to check blood sugar up to TID. E11.22 04/15/19   Charlott Rakes, MD  Biotin w/ Vitamins C & E (HAIR/SKIN/NAILS PO) Take 1 capsule by mouth daily.    [provider]  Blood Glucose Monitoring Suppl ([place])  w/Device KIT 1 kit by Does not apply route 3 (three) times daily. Use to check BG at home up to 3 times daily. E11.22 04/15/19   Charlott Rakes, MD  calcitRIOL (ROCALTROL) 0.25 MCG capsule Take 0.25 mcg by mouth 2 (two) times daily.     [provider]  cinacalcet (SENSIPAR) 90 MG tablet Take 90 mg by mouth daily.  09/22/19   [provider]  Darbepoetin Alfa (ARANESP, ALBUMIN FREE, IJ) Darbepoetin Alfa (Aranesp) 11/17/19 11/15/20  [provider]  FOSRENOL 1000 MG  PACK Take 1,000 mg by mouth See admin instructions. Take 1000 mg by mouth with meals and snacks 12/25/18   [provider]  glucose blood (ACCU-CHEK GUIDE) test strip Use as instructed to check blood sugar up to TID. E11.22 07/17/19   Fulp, Cammie, MD  insulin aspart (NOVOLOG FLEXPEN) 100 UNIT/ML FlexPen Inject 5 Units into the skin 3 (three) times daily with meals. Max daily 40 units with correction scale 05/13/20   Shamleffer, Melanie Crazier, MD  insulin glargine (LANTUS SOLOSTAR) 100 UNIT/ML Solostar Pen Inject 20 Units into the skin daily. 05/13/20   Shamleffer, Melanie Crazier, MD  Insulin Pen Needle 32G X 4 MM MISC 1 Device by Does not apply route as directed. 05/13/20   Shamleffer, Melanie Crazier, MD  lidocaine-prilocaine (EMLA) cream Apply 1 application topically as directed.  09/07/19   [provider]  multivitamin (RENA-VIT) TABS tablet Take 1 tablet by mouth daily. 07/18/19   Fulp, Cammie, MD  sevelamer carbonate (RENVELA) 0.8 g PACK packet Take 0.8 g by mouth 3 (three) times daily with meals.    [provider]    Allergies    Morphine, Peanut-containing drug products, Penicillins, and Chocolate  Review of Systems   Review of Systems  Unable to perform ROS: Mental status change    Physical Exam Updated Vital Signs BP (!) 196/94   Pulse 91   Temp 98.5 F (36.9 C) (Oral)   Resp 18   Ht '5\' 1"'  (1.549 m)   Wt 82.6 kg   SpO2 99%   BMI 34.41 kg/m   Physical Exam Vitals and nursing note reviewed.  Constitutional:      General: She is not in acute distress.    Appearance: Normal appearance. She is well-developed.  HENT:     Head: Normocephalic and atraumatic.     Right Ear: Hearing normal.     Left Ear: Hearing normal.     Nose: Nose normal.  Eyes:     Conjunctiva/sclera: Conjunctivae normal.     Pupils: Pupils are equal, round, and reactive to light.  Cardiovascular:     Rate and Rhythm: Regular rhythm.     Heart sounds: S1 normal and S2  normal. No murmur heard.  No friction rub. No gallop.   Pulmonary:     Effort: Pulmonary effort is normal. No respiratory distress.     Breath sounds: Normal breath sounds.  Chest:     Chest wall: No tenderness.  Abdominal:     General: Bowel sounds are normal.     Palpations: Abdomen is soft.     Tenderness: There is no abdominal tenderness. There is no guarding or rebound. Negative signs include Murphy's sign and McBurney's sign.     Hernia: No hernia is present.  Musculoskeletal:        General: Normal range of motion.     Cervical back: Normal range of motion and neck supple.  Skin:    General: Skin  is warm and dry.     Findings: No rash.  Neurological:     Mental Status: She is alert.     GCS: GCS eye subscore is 4. GCS verbal subscore is 5. GCS motor subscore is 6.     Cranial Nerves: No cranial nerve deficit.     Sensory: No sensory deficit.     Coordination: Coordination normal.     Comments: Slow to respond but ultimately can answer questions appropriately  Psychiatric:        Speech: Speech normal.        Behavior: Behavior normal.        Thought Content: Thought content normal.     ED Results / Procedures / Treatments   Labs (all labs ordered are listed, but only abnormal results are displayed) Labs Reviewed  CBC - Abnormal; Notable for the following components:      Result Value   RBC 3.16 (*)    Hemoglobin 9.1 (*)    HCT 32.1 (*)    MCV 101.6 (*)    MCHC 28.3 (*)    nRBC 0.3 (*)    All other components within normal limits  COMPREHENSIVE METABOLIC PANEL - Abnormal; Notable for the following components:   Potassium 3.4 (*)    Chloride 89 (*)    Glucose, Bld 416 (*)    BUN 47 (*)    Creatinine, Ser 10.06 (*)    Albumin 3.3 (*)    GFR, Estimated 5 (*)    Anion gap 16 (*)    All other components within normal limits  I-STAT CHEM 8, ED - Abnormal; Notable for the following components:   Sodium 133 (*)    Potassium 3.3 (*)    Chloride 91 (*)    BUN 45  (*)    Creatinine, Ser 10.20 (*)    Glucose, Bld 405 (*)    Calcium, Ion 1.12 (*)    Hemoglobin 10.2 (*)    HCT 30.0 (*)    All other components within normal limits  CBG MONITORING, ED - Abnormal; Notable for the following components:   Glucose-Capillary 340 (*)    All other components within normal limits  RESPIRATORY PANEL BY RT PCR (FLU A&B, COVID)  PROTIME-INR  APTT  DIFFERENTIAL  RAPID URINE DRUG SCREEN, HOSP PERFORMED  I-STAT BETA HCG BLOOD, ED ([facility], [facility], [facility] ONLY)    EKG EKG Interpretation  Date/Time:  Thursday June 30 2020 01:38:28 EST Ventricular Rate:  95 PR Interval:    QRS Duration: 85 QT Interval:  376 QTC Calculation: 473 R Axis:   35 Text Interpretation: Sinus rhythm Probable left atrial enlargement Minimal ST depression, diffuse leads No significant change since last tracing Confirmed by Orpah Greek [phone]) on 06/30/2020 1:42:34 AM   Radiology CT HEAD CODE STROKE WO CONTRAST  Result Date: 06/30/2020 CLINICAL DATA:  Code stroke.  Initial evaluation for acute aphasia. EXAM: CT HEAD WITHOUT CONTRAST TECHNIQUE: Contiguous axial images were obtained from the base of the skull through the vertex without intravenous contrast. COMPARISON:  None available. FINDINGS: Brain: Somewhat advanced cerebral atrophy for age. Patchy hypodensity within the periventricular white matter, nonspecific, but most commonly seen with chronic microvascular ischemic disease. Probable small remote left occipital lobe infarct, left PCA distribution. Scattered hypodensity involving the left cerebellum noted, consistent with ischemic changes. Findings are age indeterminate, and could be subacute or chronic in nature. No other acute large vessel territory infarct. No intracranial hemorrhage. No mass lesion, midline shift or  mass effect. No hydrocephalus or extra-axial fluid collection. Vascular: No hyperdense vessel. Skull: Scalp soft tissues and calvarium within normal limits.  Sinuses/Orbits: Layering hyperdensity noted at the posterior vitreous of the right globe (series 3, image 3), indeterminate. Similar but more subtle changes seen at the left globe. Globes and orbital soft tissues demonstrate no other acute finding. Visualized paranasal sinuses are clear. Chronic appearing left mastoid effusion. Other: None. ASPECTS [facility] Stroke Program Early CT Score) - Ganglionic level infarction (caudate, lentiform nuclei, internal capsule, insula, M1-M3 cortex): 7 - Supraganglionic infarction (M4-M6 cortex): 3 Total score (0-10 with 10 being normal): 10 IMPRESSION: 1. Patchy hypodensity within the left cerebellum, age indeterminate, but could reflect subacute to chronic ischemic changes. No intracranial hemorrhage. 2. ASPECTS is 10. 3. Probable small remote left PCA territory infarct. 4. Advanced cerebral atrophy with chronic small vessel ischemic disease for age. 5. Layering hyperdensity within the posterior vitreous chambers bilaterally, indeterminate. Correlation with physical exam recommended. These results were communicated to Dr. Rory Percy at [place] 11/18/2021by text page via the [facility] messaging system. Electronically Signed   By: Jeannine Boga M.D.   On: 06/30/2020 01:43    Procedures Procedures (including critical care time)  Medications Ordered in ED Medications  sodium chloride flush (NS) 0.9 % injection 3 mL (has no administration in time range)    ED Course  I have reviewed the triage vital signs and the nursing notes.  Pertinent labs & imaging results that were available during my care of the patient were reviewed by me and considered in my medical decision making (see chart for details).    MDM Rules/Calculators/A&P                          Patient was brought to the emergency department for evaluation of altered level of consciousness.  Patient's husband reportedly noticed a change in her and called EMS.  EMS initiated a code stroke because of her  inability to communicate, they had concern over possible aphasia.  EMS also reported some right-sided weakness during transport.  This was seen at arrival but was intermittent and not consistently present.  Patient is clearly altered and etiology is unclear.  Initial work-up in conjunction with neurology, Dr. Rory Percy.  Initial CT was equivocal, MRI has been ordered.  Dr. Malen Gauze has recommended medicine admission for further work-up for possible metabolic encephalopathy as well.  Examination remains nonfocal.  Final Clinical Impression(s) / ED Diagnoses Final diagnoses:  Metabolic encephalopathy    Rx / DC Orders ED Discharge Orders    None       Lynsey Ange, Gwenyth Allegra, MD 06/30/20 [phone]

## 2020-06-30 NOTE — Progress Notes (Signed)
Inpatient Diabetes Program Recommendations  AACE/ADA: New Consensus Statement on Inpatient Glycemic Control (2015)  Target Ranges:  Prepandial:   less than 140 mg/dL      Peak postprandial:   less than 180 mg/dL (1-2 hours)      Critically ill patients:  140 - 180 mg/dL   Lab Results  Component Value Date   GLUCAP 393 (H) 06/30/2020   HGBA1C 9.9 (H) 06/30/2020    Review of Glycemic Control Results for [name], [name] (MRN [id number]) as of 06/30/2020 13:21  Ref. Range 06/30/2020 12:36  Glucose-Capillary Latest Ref Range: 70 - 99 mg/dL 393 (H)   Diabetes history: Type 2 DM Outpatient Diabetes medications: Lantus 20 units qd, Novolog 5 units tid Current orders for Inpatient glycemic control: Novolog 0-15 units q4h  Inpatient Diabetes Program Recommendations:    ESRD, Type 2 DM, encephalopathy. Sees Shamleffer last apt 10/1. Will need to see when appropriate.  At this time consider adding Levemir 8 units QD. Secure chat sent to MD.  Thanks, Bronson Curb, MSN, RNC-OB Diabetes Coordinator [phone] (8a-5p)

## 2020-06-30 NOTE — ED Notes (Signed)
Multiple attempts made for blood specimens with success, per last attempt but sat straight up in bed and began to kick legs and pray out loud. Pt states " Lord please, God please there is too much evil." Blood draw  Unsuccessful with lab.

## 2020-06-30 NOTE — ED Notes (Signed)
First contact. Change of shift. Pt resting in bed. Lethargic but otherwise no acute distress.

## 2020-06-30 NOTE — Progress Notes (Signed)
Attempt X 1 with ultrasound unsuccessful.

## 2020-06-30 NOTE — Procedures (Signed)
   I was present at this dialysis session, have reviewed the session itself and made  appropriate changes Kelly Splinter MD [place] pager [phone]   06/30/2020, 2:28 PM

## 2020-06-30 NOTE — Code Documentation (Signed)
Stroke Response Nurse Documentation Code Documentation  [name] is a 39 y.o. female arriving to [place] ED via [facility] on 11/17 with past medical hx of ESRD,DM. Code stroke was activated by EMS. Patient from home where she was LKW at midnight and now complaining of Right sided weakness and facial droop . On No antithrombotic. Stroke team at the bedside on patient arrival. Labs drawn and patient cleared for CT by Dr. Waverly Ferrari. Patient to CT with team. NIHSS 1, see documentation for details and code stroke times. Patient with dysarthria  on exam. The following imaging was completed:  CT. Patient is not a candidate for tPA due to clinically improvement. Bedside handoff with ED RN Andria Frames.    [name]  Rapid Response RN

## 2020-06-30 NOTE — Consult Note (Addendum)
Neurology Consultation  Reason for Consult: Code stroke for right-sided weakness, slow responses to commands and questions Referring Physician: Dr. Joseph Berkshire.  ED provider.  CC: Right-sided weakness, slow response to commands, questionable aphasia  History is obtained from: Chart, patient  HPI: [name] is a 39 y.o. 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 Last known well obtained by husband according to EMS, who did not appear to be a very reliable historian, around midnight-which was about an hour and 15 minutes prior to the presentation to the ER. EMS were called in because the patient was not acting right.  She appeared to be having a blank stare.  She initially did not talk.  When she talked, she talked very slow.  She was slow to follow commands.  On their examination initially in the field, she had right grip strength weakness.  Her systolic blood pressures were in the 210s.  Fingerstick glucose onsite 240. Given the myriad of neurological symptoms and sudden onset, code stroke was activated in the field. She was evaluated in the emergency room as an acute code stroke.    LKW: 0001 hours on 06/30/2020 tpa given?: no, nonfocal exam Premorbid modified Rankin scale (mRS): 1  ROS: Unable to reliably perform due to her mentation  Past Medical History:  Diagnosis Date  . Anemia   . Anxiety   . Blind right eye 2008  . Diabetes mellitus without complication ([place])   . Dialysis patient ([place])   . ESRD (end stage renal disease) ([place])    Dialysis T/Th/Sa    Family History  Problem Relation Age of Onset  . Diabetes Mother   . Diabetes Father    Social History:   reports that she has never smoked. She has never used smokeless tobacco. She reports that she does not drink alcohol and does not use  drugs.  Medications  Current Facility-Administered Medications:  .  sodium chloride flush (NS) 0.9 % injection 3 mL, 3 mL, Intravenous, Once, Pollina, Gwenyth Allegra, MD  Current Outpatient Medications:  .  Accu-Chek FastClix Lancets MISC, Use as instructed to check blood sugar up to TID. E11.22, Disp: 102 each, Rfl: 11 .  Biotin w/ Vitamins C & E (HAIR/SKIN/NAILS PO), Take 1 capsule by mouth daily., Disp: , Rfl:  .  Blood Glucose Monitoring Suppl (ACCU-CHEK GUIDE ME) w/Device KIT, 1 kit by Does not apply route 3 (three) times daily. Use to check BG at home up to 3 times daily. E11.22, Disp: 1 kit, Rfl: 0 .  calcitRIOL (ROCALTROL) 0.25 MCG capsule, Take 0.25 mcg by mouth 2 (two) times daily. , Disp: , Rfl:  .  cinacalcet (SENSIPAR) 90 MG tablet, Take 90 mg by mouth daily. , Disp: , Rfl:  .  Darbepoetin Alfa (ARANESP, ALBUMIN FREE, IJ), Darbepoetin Alfa (Aranesp), Disp: , Rfl:  .  FOSRENOL 1000 MG PACK, Take 1,000 mg by mouth See admin instructions. Take 1000 mg by mouth with meals and snacks, Disp: , Rfl:  .  glucose blood (ACCU-CHEK GUIDE) test strip, Use as instructed to check blood sugar up to TID. E11.22, Disp: 100 each, Rfl: 12 .  insulin aspart (NOVOLOG FLEXPEN) 100 UNIT/ML FlexPen, Inject 5 Units into the skin 3 (three) times daily with meals. Max daily 40 units with correction scale, Disp: 15 mL, Rfl: 11 .  insulin glargine (LANTUS SOLOSTAR) 100 UNIT/ML Solostar  Pen, Inject 20 Units into the skin daily., Disp: 15 mL, Rfl: 11 .  Insulin Pen Needle 32G X 4 MM MISC, 1 Device by Does not apply route as directed., Disp: 150 each, Rfl: 11 .  lidocaine-prilocaine (EMLA) cream, Apply 1 application topically as directed. , Disp: , Rfl:  .  multivitamin (RENA-VIT) TABS tablet, Take 1 tablet by mouth daily., Disp: 90 tablet, Rfl: 3 .  sevelamer carbonate (RENVELA) 0.8 g PACK packet, Take 0.8 g by mouth 3 (three) times daily with meals., Disp: , Rfl:   Exam: Current vital signs: BP (!) 217/86 (BP  Location: Right Arm)   Pulse 94   Temp 98.5 F (36.9 C) (Oral)   Resp 16   Ht '5\' 1"'  (1.549 m)   Wt 82.6 kg   SpO2 94%   BMI 34.41 kg/m  Vital signs in last 24 hours: Temp:  [98.5 F (36.9 C)] 98.5 F (36.9 C) (11/18 0138) Pulse Rate:  [94] 94 (11/18 0138) Resp:  [16] 16 (11/18 0138) BP: (217)/(86) 217/86 (11/18 0138) SpO2:  [94 %] 94 % (11/18 0138) Weight:  [82.6 kg] 82.6 kg (11/18 0139) General: Awake alert in no distress HEENT: Normocephalic atraumatic Lungs: Clear Cardiovascular: Regular rate rhythm Abdomen nondistended nontender Extremities warm well perfused with some dried skin along with changes of chronic venous stasis seen on legs. Neurological exam She is awake, appears alert, slow to respond to questions. Has mild dysarthria No evidence of aphasia Cranial nerves: Pupils are equal round reactive to light, there is slightly disconjugate gaze at baseline with severely diminished acuity in the right eye and also appeared to be diminished acuity in the left eye although she was did not cooperate with full examination, no obvious visual field deficits, no obvious extraocular movement deficits, face appears symmetric, tongue and palate midline. Motor examination reveals antigravity without drift in all 4 extremities. Sensory exam: Intact to touch all over without extinction Coordination: No asterixis noted, no obvious dysmetria NIH stroke scale 1   Labs I have reviewed labs in epic and the results pertinent to this consultation are:  CBC    Component Value Date/Time   WBC 5.8 05/18/2020 0927   WBC 5.8 02/05/2020 0918   RBC 3.95 05/18/2020 0927   RBC 4.03 02/05/2020 0918   HGB 10.6 (L) 05/18/2020 0927   HCT 35.1 05/18/2020 0927   PLT 186 05/18/2020 0927   MCV 89 05/18/2020 0927   MCH 26.8 05/18/2020 0927   MCH 28.5 02/05/2020 0918   MCHC 30.2 (L) 05/18/2020 0927   MCHC 30.8 02/05/2020 0918   RDW 13.9 05/18/2020 0927   LYMPHSABS 1.6 05/18/2020 0927   MONOABS  0.4 02/05/2020 0918   EOSABS 0.6 (H) 05/18/2020 0927   BASOSABS 0.0 05/18/2020 0927    CMP     Component Value Date/Time   NA 133 (L) 05/18/2020 0927   K 5.2 05/18/2020 0927   CL 92 (L) 05/18/2020 0927   CO2 25 05/18/2020 0927   GLUCOSE 428 (H) 05/18/2020 0927   GLUCOSE 230 (H) 02/05/2020 0918   BUN 45 (H) 05/18/2020 0927   CREATININE 8.22 (H) 05/18/2020 0927   CALCIUM 9.6 05/18/2020 0927   PROT 7.6 05/18/2020 0927   ALBUMIN 3.9 05/18/2020 0927   AST 7 05/18/2020 0927   ALT 6 05/18/2020 0927   ALKPHOS 152 (H) 05/18/2020 0927   BILITOT <0.2 05/18/2020 0927   GFRNONAA 6 (L) 05/18/2020 0927   GFRAA 6 (L) 05/18/2020 0927    Lipid  Panel     Component Value Date/Time   CHOL 195 05/18/2020 0927   TRIG 84 05/18/2020 0927   HDL 55 05/18/2020 0927   CHOLHDL 3.5 05/18/2020 0927   LDLCALC 125 (H) 05/18/2020 0927   Today's labs are pending  Imaging I have reviewed the images obtained:  CT-scan of the brain-no acute bleed.  Patchy age-indeterminate hypodensity in the left cerebellum-question subacute stroke.  Advanced cerebral atrophy and chronic small vessel disease.  Layering hyperdensity within the posterior vitreous chambers bilaterally-indeterminate.   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 On my examination, other than some mild dysarthria, she had examination consistent with encephalopathy without focal findings.  Systolic blood pressure were in the 220s.  Sugars in 400s.  Hypertensive encephalopathy/hypertensive emergency should be considered. Given her history of uncontrolled diabetes, ESRD-likely toxic metabolic encephalopathy. CT head with some concerning findings of a subacute left cerebellar stroke. Further imaging would be  helpful  Impression: Acute change in mentation-likely toxic metabolic encephalopathy versus hypertensive encephalopathy. Evaluate for stroke-although young age, has risk factors  Recommendations: Stat MRI brain without contrast If positive for stroke, needs full stroke work-up. If negative MRI, only needs management of toxic metabolic derangements and does not require a full stroke work-up at the time. Discussed my plan with Dr.  Betsey Holiday  Please call after imaging is completed.  -- Amie [place], MD [facility] Pager: [phone] If 7pm to 7am, please call on call as listed on AMION.

## 2020-06-30 NOTE — ED Triage Notes (Signed)
EMS arrival from home co STROKE s/s LNW 0000 R sided weakness and facial droop, slow responses to commands and questions   Per EMS pt dialysis pt, DM type 2

## 2020-06-30 NOTE — ED Notes (Signed)
Unable to acquire IV access x 2 attempts. Placement of IV defered to IV start team. Consult ordered. Pt resting in bed.

## 2020-06-30 NOTE — H&P (Signed)
History and Physical    [name] YKD:[id number] DOB: 29-Jan-1981 DOA: 06/30/2020  PCP: [facility]  Patient coming from: Home   Chief Complaint:   Lethargy  HPI:    39 year old female with past medical history of end-stage renal disease, hypertension, right eye blindness, diabetes mellitus type 2 who presents to [facility] emergency department via EMS as a code stroke.  Patient is extremely lethargic and unable to answer questions appropriately. Patient states that she does not know why she is in the hospital. There is no family at bedside and I have been unsuccessful to obtain anyone by phone. Majority of history has been obtained via discussion with the emergency department staff.  According to emergency department staff, patient started to feel unwell the evening of 11/17. Patient's husband called EMS after patient was suddenly slow to respond. According to EMS, patient seemed to also have a having trouble comprehending and speaking. Paramedics were also initially concerned about possible initial right-sided weakness as well.  Patient initially evaluated at [facility] emergency department as a code stroke. Patient was promptly evaluated by Dr. Rory Percy with neurology who recommended MRI brain but felt that clinically patient was more likely to suffer from encephalopathy rather than stroke. The hospitalist group was then called to assess the patient for admission to the hospital.  Review of Systems:   Review of Systems  Unable to perform ROS: Mental status change    Past Medical History:  Diagnosis Date  . Anemia   . Anxiety   . Blind right eye 2008  . Diabetes mellitus without complication ([place])   . Dialysis patient ([place])   . ESRD (end stage renal disease) [facility])    Dialysis T/Th/Sa    Past Surgical History:  Procedure Laterality Date  . AMPUTATION TOE Left   . AV FISTULA PLACEMENT    . CESAREAN SECTION  2011  . FISTULA  SUPERFICIALIZATION Right [id number]/7341   Procedure: 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;  Surgeon: Angelia Mould, MD;  Location: [place];  Service: Vascular;  Laterality: Right;  . INSERTION OF DIALYSIS CATHETER Left 07/27/2019   Procedure: INSERTION OF TUNNELED  DIALYSIS CATHETER;  Surgeon: Angelia Mould, MD;  Location: [place];  Service: Vascular;  Laterality: Left;     reports that she has never smoked. She has never used smokeless tobacco. She reports that she does not drink alcohol and does not use drugs.  Allergies  Allergen Reactions  . Morphine Rash and Other (See Comments)  . Peanut-Containing Drug Products Anaphylaxis and Hives  . Penicillins Anaphylaxis  . Chocolate Hives    Family History  Problem Relation Age of Onset  . Diabetes Mother   . Diabetes Father      Prior to Admission medications   Medication Sig Start Date End Date Taking? Authorizing Provider  Biotin w/ Vitamins C & E (HAIR/SKIN/NAILS PO) Take 1 capsule by mouth daily.   Yes [provider]  cinacalcet (SENSIPAR) 90 MG tablet Take 90 mg by mouth daily.  09/22/19  Yes [provider]  FOSRENOL 1000 MG PACK Take 2,000 mg by mouth See admin instructions. Take 2000 mg ( 2 packs) by mouth with meals three times daily and 1 pack (1010m) twice daily with a snack 12/25/18  Yes [provider]  insulin aspart (NOVOLOG FLEXPEN) 100 UNIT/ML FlexPen Inject 5 Units into the skin 3 (three) times daily with meals. Max daily 40 units with correction  scale 05/13/20  Yes Shamleffer, Melanie Crazier, MD  insulin glargine (LANTUS SOLOSTAR) 100 UNIT/ML Solostar Pen Inject 20 Units into the skin daily. 05/13/20  Yes Shamleffer, Melanie Crazier, MD  lidocaine-prilocaine (EMLA) cream Apply 1 application topically as directed.  09/07/19  Yes [provider]  multivitamin (RENA-VIT) TABS tablet Take 1 tablet by mouth daily. 07/18/19  Yes Fulp, Cammie, MD  Accu-Chek FastClix  Lancets MISC Use as instructed to check blood sugar up to TID. E11.22 04/15/19   Charlott Rakes, MD  Blood Glucose Monitoring Suppl (ACCU-CHEK GUIDE ME) w/Device KIT 1 kit by Does not apply route 3 (three) times daily. Use to check BG at home up to 3 times daily. E11.22 04/15/19   Charlott Rakes, MD  calcitRIOL (ROCALTROL) 0.25 MCG capsule Take 0.25 mcg by mouth 2 (two) times daily.     [provider]  Darbepoetin Alfa (ARANESP, ALBUMIN FREE, IJ) Darbepoetin Alfa (Aranesp) 11/17/19 11/15/20  [provider]  glucose blood (ACCU-CHEK GUIDE) test strip Use as instructed to check blood sugar up to TID. E11.22 07/17/19   Fulp, Cammie, MD  Insulin Pen Needle 32G X 4 MM MISC 1 Device by Does not apply route as directed. 05/13/20   Shamleffer, Melanie Crazier, MD  sevelamer carbonate (RENVELA) 0.8 g PACK packet Take 0.8 g by mouth 3 (three) times daily with meals. Patient not taking: Reported on 06/30/2020    [provider]    Physical Exam: Vitals:   06/30/20 0530 06/30/20 0700 06/30/20 0745 06/30/20 0800  BP: (!) 196/94 (!) 182/90 (!) 191/87 (!) 193/83  Pulse: 91 90 88 86  Resp: _0 Temp:  97.6 F (36.4 C)    TempSrc:      SpO2: 99% 100% 100% 98%  Weight:      Height:        Constitutional: Patient is extremely lethargic but arousable. Patient is oriented x2. Patient is not in any acute distress. Skin: no rashes, no lesions, good skin turgor noted. Eyes: Pupils are equally reactive to light.  No evidence of scleral icterus or conjunctival pallor.  ENMT: Moist mucous membranes noted.  Posterior pharynx clear of any exudate or lesions.   Neck: normal, supple, no masses, no thyromegaly.  No evidence of jugular venous distension.   Respiratory: clear to auscultation bilaterally, no wheezing, no crackles. Normal respiratory effort. No accessory muscle use.  Cardiovascular: Regular rate and rhythm, no murmurs / rubs / gallops. No extremity edema. 2+ pedal pulses. No  carotid bruits.  Chest:   Nontender without crepitus or deformity.   Back:   Nontender without crepitus or deformity. Abdomen: Abdomen is soft and nontender.  No evidence of intra-abdominal masses.  Positive bowel sounds noted in all quadrants.   Musculoskeletal: No joint deformity upper and lower extremities. Good ROM, no contractures. Normal muscle tone.  Neurologic: Patient is extremely lethargic but arousable. Patient is oriented x2. Patient is intermittently following commands. Patient is responsive to verbal and painful stimuli.  Psychiatric: Unable to fully assess due to substantial lethargy. Patient currently does not seem to possess insight as to her current situation.  Labs on Admission: I have personally reviewed following labs and imaging studies -   CBC: Recent Labs  Lab 06/30/20 0230 06/30/20 0237  WBC 6.7  --   NEUTROABS 4.7  --   HGB 9.1* 10.2*  HCT 32.1* 30.0*  MCV 101.6*  --   PLT 163  --    Basic Metabolic Panel: Recent  Labs  Lab 06/30/20 0230 06/30/20 0237  NA 135 133*  K 3.4* 3.3*  CL 89* 91*  CO2 30  --   GLUCOSE 416* 405*  BUN 47* 45*  CREATININE 10.06* 10.20*  CALCIUM 9.9  --    GFR: Estimated Creatinine Clearance: 7.3 mL/min (A) (by C-G formula based on SCr of 10.2 mg/dL (H)). Liver Function Tests: Recent Labs  Lab 06/30/20 0230  AST 17  ALT 12  ALKPHOS 105  BILITOT 0.4  PROT 7.2  ALBUMIN 3.3*   No results for input(s): LIPASE, AMYLASE in the last 168 hours. No results for input(s): AMMONIA in the last 168 hours. Coagulation Profile: Recent Labs  Lab 06/30/20 0230  INR 1.1   Cardiac Enzymes: No results for input(s): CKTOTAL, CKMB, CKMBINDEX, TROPONINI in the last 168 hours. BNP (last 3 results) No results for input(s): PROBNP in the last 8760 hours. HbA1C: No results for input(s): HGBA1C in the last 72 hours. CBG: Recent Labs  Lab 06/30/20 0130  GLUCAP 340*   Lipid Profile: No results for input(s): CHOL, HDL, LDLCALC,  TRIG, CHOLHDL, LDLDIRECT in the last 72 hours. Thyroid Function Tests: No results for input(s): TSH, T4TOTAL, FREET4, T3FREE, THYROIDAB in the last 72 hours. Anemia Panel: No results for input(s): VITAMINB12, FOLATE, FERRITIN, TIBC, IRON, RETICCTPCT in the last 72 hours. Urine analysis:    Component Value Date/Time   COLORURINE YELLOW 02/21/2019 1955   APPEARANCEUR TURBID (A) 02/21/2019 1955   LABSPEC 1.012 02/21/2019 1955   PHURINE 7.0 02/21/2019 1955   GLUCOSEU >=500 (A) 02/21/2019 1955   HGBUR MODERATE (A) 02/21/2019 1955   BILIRUBINUR NEGATIVE 02/21/2019 [place] NEGATIVE 02/21/2019 1955   PROTEINUR 100 (A) 02/21/2019 1955   NITRITE NEGATIVE 02/21/2019 1955   LEUKOCYTESUR MODERATE (A) 02/21/2019 1955    Radiological Exams on Admission - Personally Reviewed: MR BRAIN WO CONTRAST  Result Date: 06/30/2020 CLINICAL DATA:  Acute aphasia EXAM: MRI HEAD WITHOUT CONTRAST TECHNIQUE: Multiplanar, multiecho pulse sequences of the brain and surrounding structures were obtained without intravenous contrast. COMPARISON:  Head CT from earlier today FINDINGS: Brain: No acute infarction, hemorrhage, hydrocephalus, extra-axial collection or mass lesion. The cerebellar abnormality inferiorly on the left is from remote infarct. Small remote left occipital cortex infarct. There is periventricular FLAIR hyperintensity with 2 discrete remote corpus callosum insults with crossing wallerian degeneration. Although young, the patient has significant vascular risk factors in this is likely sequela of small-vessel disease. Normal brain volume. Vascular: Normal flow voids. Skull and upper cervical spine: Normal marrow signal Sinuses/Orbits: Bilateral cataract resection. 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 IMPRESSION: 1. No acute finding. 2. 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 3. Chronic white matter disease. Electronically  Signed   By: Monte Fantasia M.D.   On: 06/30/2020 07:02   CT HEAD CODE STROKE WO CONTRAST  Result Date: 06/30/2020 CLINICAL DATA:  Code stroke.  Initial evaluation for acute aphasia. EXAM: CT HEAD WITHOUT CONTRAST TECHNIQUE: Contiguous axial images were obtained from the base of the skull through the vertex without intravenous contrast. COMPARISON:  None available. FINDINGS: Brain: Somewhat advanced cerebral atrophy for age. Patchy hypodensity within the periventricular white matter, nonspecific, but most commonly seen with chronic microvascular ischemic disease. Probable small remote left occipital lobe infarct, left PCA distribution. Scattered hypodensity involving the left cerebellum noted, consistent with ischemic changes. Findings are age indeterminate, and could be subacute or chronic in nature. No other  acute large vessel territory infarct. No intracranial hemorrhage. No mass lesion, midline shift or mass effect. No hydrocephalus or extra-axial fluid collection. Vascular: No hyperdense vessel. Skull: Scalp soft tissues and calvarium within normal limits. Sinuses/Orbits: Layering hyperdensity noted at the posterior vitreous of the right globe (series 3, image 3), indeterminate. Similar but more subtle changes seen at the left globe. Globes and orbital soft tissues demonstrate no other acute finding. Visualized paranasal sinuses are clear. Chronic appearing left mastoid effusion. Other: None. ASPECTS [facility] Stroke Program Early CT Score) - Ganglionic level infarction (caudate, lentiform nuclei, internal capsule, insula, M1-M3 cortex): 7 - Supraganglionic infarction (M4-M6 cortex): 3 Total score (0-10 with 10 being normal): 10 IMPRESSION: 1. Patchy hypodensity within the left cerebellum, age indeterminate, but could reflect subacute to chronic ischemic changes. No intracranial hemorrhage. 2. ASPECTS is 10. 3. Probable small remote left PCA territory infarct. 4. Advanced cerebral atrophy with chronic  small vessel ischemic disease for age. 5. Layering hyperdensity within the posterior vitreous chambers bilaterally, indeterminate. Correlation with physical exam recommended. These results were communicated to Dr. Rory Percy at [place] 11/18/2021by text page via the [facility] messaging system. Electronically Signed   By: Jeannine Boga M.D.   On: 06/30/2020 01:43    EKG: Personally reviewed.  Rhythm is normal sinus rhythm with heart rate of 95 bpm.  No dynamic ST segment changes appreciated.  Assessment/Plan Principal Problem:   Acute encephalopathy   While patient was initially admitted as a code stroke, neurologic exam is nonfocal. Patient is more likely suffering from a toxic metabolic encephalopathy or possibly hypertensive encephalopathy considering concurrent hypertensive urgency/emergency  Uremic encephalopathy unlikely considering BUN is only 47.  Awaiting results of MRI  Performing serial neurologic checks  Keeping patient n.p.o. until patient is able to pass swallow screen.  Conservative management of blood pressure with permissive hypertension until MRI results are back  Avoiding any sedating agents  Obtaining hepatic function panel, TSH, vitamin B12, folate  Patient is essentially anuric and therefore urinalysis and drug screen are not being obtained.  Active Problems:   Hypertensive urgency   Permissive hypertension for now until MRI results are back  We will then slowly titrate patient on antihypertensives with 25% blood pressure reduction in the first 24 hours followed by continued adjustments to achieve blood pressure control.  As needed intravenous hydralazine for now.    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  Holding scheduled home regimen of insulin therapy while patient is n.p.o.  Hemoglobin A1c pending    Code Status:  Full code Family Communication: Attempts to contact family have been unsuccessful  Status is: Observation  The patient remains OBS appropriate and will d/c before 2 midnights.  Dispo: The patient is from: Home              Anticipated d/c is to: Home              Anticipated d/c date is: 2 days              Patient currently is not medically stable to d/c.        Vernelle Emerald MD [facility] Pager [phone]  If 7PM-7AM, please contact night-coverage [email or web address] Use universal [place] password for that web site. If you do not have the password, please call the hospital operator.  06/30/2020, 8:52 AM

## 2020-06-30 NOTE — Consult Note (Signed)
[facility] Renal Consultation Note    Indication for Consultation:  Management of ESRD/hemodialysis; anemia, hypertension/volume and secondary hyperparathyroidism  HPI: [name] is a 39 y.o. female with ESRD on HD TTS HTN, DMT2, right eye blindness who is admitted with AMS/weakness. Code stroke called on arrival and neurology consulted. Brain MRI negative for acute changes. She was hypertensive on arrival with SBPs >200. Labs significant for hyperglycemia. K 3.4, BUN 47 Cr 10, CO 30, Glucose 416, WBC 6.7, Hgb 9.1  Seen in ED, husband at bedside. She is slow to answer questions, but following commands. They aren't sure if she takes any medications for her blood pressure.  Dialysis TTS at [facility]. Last dialysis was Tuesday. She has been compliant with dialysis and has not missed any treatments.   Past Medical History:  Diagnosis Date  . Anemia   . Anxiety   . Blind right eye 2008  . Diabetes mellitus without complication ([place])   . Dialysis patient ([place])   . ESRD (end stage renal disease) [facility])    Dialysis T/Th/Sa   Past Surgical History:  Procedure Laterality Date  . AMPUTATION TOE Left   . AV FISTULA PLACEMENT    . CESAREAN SECTION  2011  . FISTULA SUPERFICIALIZATION Right 63/78/5885   Procedure: 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;  Surgeon: Angelia Mould, MD;  Location: [facility];  Service: Vascular;  Laterality: Right;  . INSERTION OF DIALYSIS CATHETER Left 07/27/2019   Procedure: INSERTION OF TUNNELED  DIALYSIS CATHETER;  Surgeon: Angelia Mould, MD;  Location: [facility] OR;  Service: Vascular;  Laterality: Left;   Family History  Problem Relation Age of Onset  . Diabetes Mother   . Diabetes Father    Social History:  reports that she has never smoked. She has never used smokeless tobacco. She reports that she does not drink alcohol and does not use drugs. Allergies  Allergen Reactions  . Morphine Rash and  Other (See Comments)  . Peanut-Containing Drug Products Anaphylaxis and Hives  . Penicillins Anaphylaxis  . Chocolate Hives   Prior to Admission medications   Medication Sig Start Date End Date Taking? Authorizing Provider  Biotin w/ Vitamins C & E (HAIR/SKIN/NAILS PO) Take 1 capsule by mouth daily.   Yes [provider]  cinacalcet (SENSIPAR) 90 MG tablet Take 90 mg by mouth daily.  09/22/19  Yes [provider]  FOSRENOL 1000 MG PACK Take 2,000 mg by mouth See admin instructions. Take 2000 mg ( 2 packs) by mouth with meals three times daily and 1 pack ($Remov'1000mg'lNPMeM$ ) twice daily with a snack 12/25/18  Yes [provider]  insulin aspart (NOVOLOG FLEXPEN) 100 UNIT/ML FlexPen Inject 5 Units into the skin 3 (three) times daily with meals. Max daily 40 units with correction scale 05/13/20  Yes Shamleffer, Melanie Crazier, MD  insulin glargine (LANTUS SOLOSTAR) 100 UNIT/ML Solostar Pen Inject 20 Units into the skin daily. 05/13/20  Yes Shamleffer, Melanie Crazier, MD  lidocaine-prilocaine (EMLA) cream Apply 1 application topically as directed.  09/07/19  Yes [provider]  multivitamin (RENA-VIT) TABS tablet Take 1 tablet by mouth daily. 07/18/19  Yes Fulp, Cammie, MD  Accu-Chek FastClix Lancets MISC Use as instructed to check blood sugar up to TID. E11.22 04/15/19   Charlott Rakes, MD  Blood Glucose Monitoring Suppl (ACCU-CHEK GUIDE ME) w/Device KIT 1 kit by Does not apply route 3 (three) times daily. Use to check BG at home up to 3  times daily. E11.22 04/15/19   Charlott Rakes, MD  calcitRIOL (ROCALTROL) 0.25 MCG capsule Take 0.25 mcg by mouth 2 (two) times daily.     [provider]  Darbepoetin Alfa (ARANESP, ALBUMIN FREE, IJ) Darbepoetin Alfa (Aranesp) 11/17/19 11/15/20  [provider]  glucose blood (ACCU-CHEK GUIDE) test strip Use as instructed to check blood sugar up to TID. E11.22 07/17/19   Fulp, Cammie, MD  Insulin Pen Needle 32G X 4 MM MISC 1 Device by  Does not apply route as directed. 05/13/20   Shamleffer, Melanie Crazier, MD  sevelamer carbonate (RENVELA) 0.8 g PACK packet Take 0.8 g by mouth 3 (three) times daily with meals. Patient not taking: Reported on 06/30/2020    [provider]   Current Facility-Administered Medications  Medication Dose Route Frequency Provider Last Rate Last Admin  . sodium chloride flush (NS) 0.9 % injection 3 mL  3 mL Intravenous Once Pollina, Gwenyth Allegra, MD       Current Outpatient Medications  Medication Sig Dispense Refill  . Biotin w/ Vitamins C & E (HAIR/SKIN/NAILS PO) Take 1 capsule by mouth daily.    . cinacalcet (SENSIPAR) 90 MG tablet Take 90 mg by mouth daily.     Marland Kitchen FOSRENOL 1000 MG PACK Take 2,000 mg by mouth See admin instructions. Take 2000 mg ( 2 packs) by mouth with meals three times daily and 1 pack (1013m) twice daily with a snack    . insulin aspart (NOVOLOG FLEXPEN) 100 UNIT/ML FlexPen Inject 5 Units into the skin 3 (three) times daily with meals. Max daily 40 units with correction scale 15 mL 11  . insulin glargine (LANTUS SOLOSTAR) 100 UNIT/ML Solostar Pen Inject 20 Units into the skin daily. 15 mL 11  . lidocaine-prilocaine (EMLA) cream Apply 1 application topically as directed.     . multivitamin (RENA-VIT) TABS tablet Take 1 tablet by mouth daily. 90 tablet 3  . Accu-Chek FastClix Lancets MISC Use as instructed to check blood sugar up to TID. E11.22 102 each 11  . Blood Glucose Monitoring Suppl (ACCU-CHEK GUIDE ME) w/Device KIT 1 kit by Does not apply route 3 (three) times daily. Use to check BG at home up to 3 times daily. E11.22 1 kit 0  . calcitRIOL (ROCALTROL) 0.25 MCG capsule Take 0.25 mcg by mouth 2 (two) times daily.     . Darbepoetin Alfa (ARANESP, ALBUMIN FREE, IJ) Darbepoetin Alfa (Aranesp)    . glucose blood (ACCU-CHEK GUIDE) test strip Use as instructed to check blood sugar up to TID. E11.22 100 each 12  . Insulin Pen Needle 32G X 4 MM MISC 1 Device by Does not  apply route as directed. 150 each 11  . sevelamer carbonate (RENVELA) 0.8 g PACK packet Take 0.8 g by mouth 3 (three) times daily with meals. (Patient not taking: Reported on 06/30/2020)       ROS: As per HPI otherwise negative.  Physical Exam: Vitals:   06/30/20 0345 06/30/20 0500 06/30/20 0502 06/30/20 0530  BP: (!) 193/82 (!) 188/81 (!) 188/81 (!) 196/94  Pulse: 86 88 88 91  Resp: _0 Temp:      TempSrc:      SpO2: 97% 98% 99% 99%  Weight:      Height:         General: WDWN nad  Head: NCAT sclera not icteric MMM Neck: Supple. No JVD appreciated  Lungs: CTA bilaterally without wheezes, rales, or rhonchi. Breathing is unlabored. Heart: RRR  with S1 S2 Abdomen: soft non-tender  Lower extremities:without edema or ischemic changes, no open wounds  Neuro: A & O  X 3. Moves all extremities spontaneously. Psych:  Lethargic  Dialysis Access: R AVF +bruit   Labs: Basic Metabolic Panel: Recent Labs  Lab 06/30/20 0230 06/30/20 0237  NA 135 133*  K 3.4* 3.3*  CL 89* 91*  CO2 30  --   GLUCOSE 416* 405*  BUN 47* 45*  CREATININE 10.06* 10.20*  CALCIUM 9.9  --    Liver Function Tests: Recent Labs  Lab 06/30/20 0230  AST 17  ALT 12  ALKPHOS 105  BILITOT 0.4  PROT 7.2  ALBUMIN 3.3*   No results for input(s): LIPASE, AMYLASE in the last 168 hours. No results for input(s): AMMONIA in the last 168 hours. CBC: Recent Labs  Lab 06/30/20 0230 06/30/20 0237  WBC 6.7  --   NEUTROABS 4.7  --   HGB 9.1* 10.2*  HCT 32.1* 30.0*  MCV 101.6*  --   PLT 163  --    Cardiac Enzymes: No results for input(s): CKTOTAL, CKMB, CKMBINDEX, TROPONINI in the last 168 hours. CBG: Recent Labs  Lab 06/30/20 0130  GLUCAP 340*   Iron Studies: No results for input(s): IRON, TIBC, TRANSFERRIN, FERRITIN in the last 72 hours. Studies/Results: MR BRAIN WO CONTRAST  Result Date: 06/30/2020 CLINICAL DATA:  Acute aphasia EXAM: MRI HEAD WITHOUT CONTRAST TECHNIQUE: Multiplanar,  multiecho pulse sequences of the brain and surrounding structures were obtained without intravenous contrast. COMPARISON:  Head CT from earlier today FINDINGS: Brain: No acute infarction, hemorrhage, hydrocephalus, extra-axial collection or mass lesion. The cerebellar abnormality inferiorly on the left is from remote infarct. Small remote left occipital cortex infarct. There is periventricular FLAIR hyperintensity with 2 discrete remote corpus callosum insults with crossing wallerian degeneration. Although young, the patient has significant vascular risk factors in this is likely sequela of small-vessel disease. Normal brain volume. Vascular: Normal flow voids. Skull and upper cervical spine: Normal marrow signal Sinuses/Orbits: Bilateral cataract resection. 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 IMPRESSION: 1. No acute finding. 2. 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 3. Chronic white matter disease. Electronically Signed   By: Monte Fantasia M.D.   On: 06/30/2020 07:02   CT HEAD CODE STROKE WO CONTRAST  Result Date: 06/30/2020 CLINICAL DATA:  Code stroke.  Initial evaluation for acute aphasia. EXAM: CT HEAD WITHOUT CONTRAST TECHNIQUE: Contiguous axial images were obtained from the base of the skull through the vertex without intravenous contrast. COMPARISON:  None available. FINDINGS: Brain: Somewhat advanced cerebral atrophy for age. Patchy hypodensity within the periventricular white matter, nonspecific, but most commonly seen with chronic microvascular ischemic disease. Probable small remote left occipital lobe infarct, left PCA distribution. Scattered hypodensity involving the left cerebellum noted, consistent with ischemic changes. Findings are age indeterminate, and could be subacute or chronic in nature. No other acute large vessel territory infarct. No intracranial hemorrhage. No mass lesion, midline shift or mass effect.  No hydrocephalus or extra-axial fluid collection. Vascular: No hyperdense vessel. Skull: Scalp soft tissues and calvarium within normal limits. Sinuses/Orbits: Layering hyperdensity noted at the posterior vitreous of the right globe (series 3, image 3), indeterminate. Similar but more subtle changes seen at the left globe. Globes and orbital soft tissues demonstrate no other acute finding. Visualized paranasal sinuses are clear. Chronic appearing left mastoid effusion. Other: None. ASPECTS [facility] Stroke Program Early CT Score) -  Ganglionic level infarction (caudate, lentiform nuclei, internal capsule, insula, M1-M3 cortex): 7 - Supraganglionic infarction (M4-M6 cortex): 3 Total score (0-10 with 10 being normal): 10 IMPRESSION: 1. Patchy hypodensity within the left cerebellum, age indeterminate, but could reflect subacute to chronic ischemic changes. No intracranial hemorrhage. 2. ASPECTS is 10. 3. Probable small remote left PCA territory infarct. 4. Advanced cerebral atrophy with chronic small vessel ischemic disease for age. 5. Layering hyperdensity within the posterior vitreous chambers bilaterally, indeterminate. Correlation with physical exam recommended. These results were communicated to Dr. Rory Percy at [place] 11/18/2021by text page via the [facility] messaging system. Electronically Signed   By: Jeannine Boga M.D.   On: 06/30/2020 01:43    Dialysis Orders:  [facility] TTS 3h 45 min EDW 68 kg 2K/2Ca UFP 2 Hep 4000 R AVF Hectorol 8 TIW Venofer 50 q wk Mircera 75 q 4 wks (last 11/9)  Assessment/Plan: 1. AMS/weakness. Code stroke on admission. More likely acute encephalopathy Not likely uremic. Has not missed any HD. ?HTN encephalopathy.  2. ESRD -  HD TTS. HD today on schedule.  3. Hypertension/volume  - Significant HTN on admission. BP usually well controlled. Does not appear to be on antiHTN meds at home. Per primary.  4. Anemia  - Hgb 9.1 On ESA as outpatient -follow trends.  5. Metabolic bone  disease -  Continue Renvela binder, Sensipar when eating.  6. DM - insulin per primary   Lynnda Child PA-C [facility] 06/30/2020, 8:21 AM

## 2020-06-30 NOTE — ED Notes (Signed)
Report to Danny, RN

## 2020-06-30 NOTE — Evaluation (Signed)
Physical Therapy Evaluation Patient Details Name: [name] MRN: [id number] DOB: Jan 09, 1981 Today's Date: 06/30/2020   History of Present Illness  Pt is a 39 y/o female admitted secondary to acute encephalopathy. PMH includes ESRD on HD, DM, HTN, and R eye blindness.   Clinical Impression  Pt admitted secondary to problem above with deficits below. Pt presenting with slow processing and difficulty sequencing. Had increased difficulty taking steps and required max multimodal cues. Min A for mobility tasks this session. Anticipate mobility will improve as cognition improves and pt will likely be able to d/c home with HHPT vs no PT follow up. However, if mobility does not improve, may need to consider post acute rehab. Will continue to follow acutely.     Follow Up Recommendations Other (comment);Supervision/Assistance - 24 hour (TBD pending progression; likely HHPT vs no PT follow up)    Equipment Recommendations  Other (comment) (TBD pending progression )    Recommendations for Other Services       Precautions / Restrictions Precautions Precautions: Fall Restrictions Weight Bearing Restrictions: No      Mobility  Bed Mobility Overal bed mobility: Needs Assistance Bed Mobility: Supine to Sit;Sit to Supine     Supine to sit: Min assist Sit to supine: Supervision   General bed mobility comments: Min A for trunk elevation. Max multimodal cues for sequencing. Supervision for safety to return to supine.     Transfers Overall transfer level: Needs assistance Equipment used: 1 person hand held assist Transfers: Sit to/from Stand Sit to Stand: Min assist         General transfer comment: Min A for lift assist and steadying to stand.   Ambulation/Gait Ambulation/Gait assistance: Min assist   Assistive device: 1 person hand held assist       General Gait Details: Took side steps at EOB this session. Difficulty sequencing to take steps and PT had to assist with weight  shifting.   Stairs            Wheelchair Mobility    Modified Rankin (Stroke Patients Only)       Balance Overall balance assessment: Needs assistance Sitting-balance support: No upper extremity supported;Feet supported Sitting balance-Leahy Scale: Fair     Standing balance support: Single extremity supported;During functional activity Standing balance-Leahy Scale: Poor Standing balance comment: Reliant on at least 1 UE support                              Pertinent Vitals/Pain Pain Assessment: No/denies pain    Home Living Family/patient expects to be discharged to:: Private residence Living Arrangements: Spouse/significant other Available Help at Discharge: Family Type of Home: Apartment Home Access: Stairs to enter Entrance Stairs-Rails: Left Entrance Stairs-Number of Steps: flight Home Layout: One level Home Equipment: None      Prior Function Level of Independence: Independent               Hand Dominance        Extremity/Trunk Assessment   Upper Extremity Assessment Upper Extremity Assessment: Defer to OT evaluation    Lower Extremity Assessment Lower Extremity Assessment: Generalized weakness    Cervical / Trunk Assessment Cervical / Trunk Assessment: Normal  Communication   Communication: No difficulties  Cognition Arousal/Alertness: Awake/alert Behavior During Therapy: Flat affect Overall Cognitive Status: Impaired/Different from baseline Area of Impairment: Orientation;Memory;Following commands;Problem solving;Awareness  Orientation Level: Disoriented to;Situation   Memory: Decreased short-term memory Following Commands: Follows one step commands with increased time   Awareness: Emergent Problem Solving: Slow processing;Decreased initiation;Difficulty sequencing;Requires verbal cues;Requires tactile cues General Comments: Pt disoriented to situation. Very flat affect throughout. Difficulty  taking side steps this session. Required max multimodal cues for sequencing.       General Comments General comments (skin integrity, edema, etc.): Pt's husband present during session.     Exercises     Assessment/Plan    PT Assessment Patient needs continued PT services  PT Problem List Decreased balance;Decreased activity tolerance;Decreased mobility;Decreased cognition;Decreased knowledge of use of DME;Decreased safety awareness;Decreased knowledge of precautions       PT Treatment Interventions Gait training;DME instruction;Stair training;Functional mobility training;Therapeutic exercise;Therapeutic activities;Balance training;Patient/family education    PT Goals (Current goals can be found in the Care Plan section)  Acute Rehab PT Goals Patient Stated Goal: none stated PT Goal Formulation: With patient Time For Goal Achievement: 07/14/20 Potential to Achieve Goals: Fair    Frequency Min 3X/week   Barriers to discharge        Co-evaluation               AM-PAC PT "6 Clicks" Mobility  Outcome Measure Help needed turning from your back to your side while in a flat bed without using bedrails?: None Help needed moving from lying on your back to sitting on the side of a flat bed without using bedrails?: A Little Help needed moving to and from a bed to a chair (including a wheelchair)?: A Little Help needed standing up from a chair using your arms (e.g., wheelchair or bedside chair)?: A Little Help needed to walk in hospital room?: A Lot Help needed climbing 3-5 steps with a railing? : Total 6 Click Score: 16    End of Session Equipment Utilized During Treatment: Gait belt Activity Tolerance: Patient tolerated treatment well Patient left: in bed;with call bell/phone within reach;with bed alarm set;with family/visitor present Nurse Communication: Mobility status PT Visit Diagnosis: Difficulty in walking, not elsewhere classified (R26.2);Muscle weakness (generalized)  (M62.81)    Time: 3500-9381 PT Time Calculation (min) (ACUTE ONLY): 18 min   Charges:   PT Evaluation $PT Eval Moderate Complexity: 1 Mod          Reuel Derby, PT, DPT  Acute Rehabilitation Services  Pager: [phone] Office: [phone]   Rudean Hitt 06/30/2020, 11:53 AM

## 2020-07-01 DIAGNOSIS — G9341 Metabolic encephalopathy: Secondary | ICD-10-CM

## 2020-07-01 DIAGNOSIS — H5461 Unqualified visual loss, right eye, normal vision left eye: Secondary | ICD-10-CM | POA: Diagnosis present

## 2020-07-01 DIAGNOSIS — G9340 Encephalopathy, unspecified: Secondary | ICD-10-CM | POA: Diagnosis present

## 2020-07-01 DIAGNOSIS — I160 Hypertensive urgency: Secondary | ICD-10-CM | POA: Diagnosis present

## 2020-07-01 DIAGNOSIS — D631 Anemia in chronic kidney disease: Secondary | ICD-10-CM | POA: Diagnosis present

## 2020-07-01 DIAGNOSIS — E1122 Type 2 diabetes mellitus with diabetic chronic kidney disease: Secondary | ICD-10-CM | POA: Diagnosis present

## 2020-07-01 DIAGNOSIS — Z885 Allergy status to narcotic agent status: Secondary | ICD-10-CM | POA: Diagnosis not present

## 2020-07-01 DIAGNOSIS — M898X9 Other specified disorders of bone, unspecified site: Secondary | ICD-10-CM | POA: Diagnosis present

## 2020-07-01 DIAGNOSIS — N186 End stage renal disease: Secondary | ICD-10-CM | POA: Diagnosis present

## 2020-07-01 DIAGNOSIS — N2581 Secondary hyperparathyroidism of renal origin: Secondary | ICD-10-CM | POA: Diagnosis present

## 2020-07-01 DIAGNOSIS — Z91010 Allergy to peanuts: Secondary | ICD-10-CM | POA: Diagnosis not present

## 2020-07-01 DIAGNOSIS — E113599 Type 2 diabetes mellitus with proliferative diabetic retinopathy without macular edema, unspecified eye: Secondary | ICD-10-CM | POA: Diagnosis present

## 2020-07-01 DIAGNOSIS — G928 Other toxic encephalopathy: Secondary | ICD-10-CM | POA: Diagnosis present

## 2020-07-01 DIAGNOSIS — Z833 Family history of diabetes mellitus: Secondary | ICD-10-CM | POA: Diagnosis not present

## 2020-07-01 DIAGNOSIS — Z91018 Allergy to other foods: Secondary | ICD-10-CM | POA: Diagnosis not present

## 2020-07-01 DIAGNOSIS — Z992 Dependence on renal dialysis: Secondary | ICD-10-CM | POA: Diagnosis not present

## 2020-07-01 DIAGNOSIS — Z89421 Acquired absence of other right toe(s): Secondary | ICD-10-CM | POA: Diagnosis not present

## 2020-07-01 DIAGNOSIS — Z880 Allergy status to penicillin: Secondary | ICD-10-CM | POA: Diagnosis not present

## 2020-07-01 DIAGNOSIS — Z794 Long term (current) use of insulin: Secondary | ICD-10-CM | POA: Diagnosis not present

## 2020-07-01 DIAGNOSIS — I120 Hypertensive chronic kidney disease with stage 5 chronic kidney disease or end stage renal disease: Secondary | ICD-10-CM | POA: Diagnosis present

## 2020-07-01 DIAGNOSIS — E785 Hyperlipidemia, unspecified: Secondary | ICD-10-CM | POA: Diagnosis present

## 2020-07-01 DIAGNOSIS — E8770 Fluid overload, unspecified: Secondary | ICD-10-CM

## 2020-07-01 DIAGNOSIS — Z20822 Contact with and (suspected) exposure to covid-19: Secondary | ICD-10-CM | POA: Diagnosis present

## 2020-07-01 DIAGNOSIS — Z79899 Other long term (current) drug therapy: Secondary | ICD-10-CM | POA: Diagnosis not present

## 2020-07-01 DIAGNOSIS — I674 Hypertensive encephalopathy: Secondary | ICD-10-CM | POA: Diagnosis not present

## 2020-07-01 DIAGNOSIS — Z8673 Personal history of transient ischemic attack (TIA), and cerebral infarction without residual deficits: Secondary | ICD-10-CM | POA: Diagnosis not present

## 2020-07-01 DIAGNOSIS — D638 Anemia in other chronic diseases classified elsewhere: Secondary | ICD-10-CM | POA: Diagnosis not present

## 2020-07-01 LAB — GLUCOSE, CAPILLARY
Glucose-Capillary: 128 mg/dL — ABNORMAL HIGH (ref 70–99)
Glucose-Capillary: 48 mg/dL — ABNORMAL LOW (ref 70–99)
Glucose-Capillary: 111 mg/dL — ABNORMAL HIGH (ref 70–99)
Glucose-Capillary: 56 mg/dL — ABNORMAL LOW (ref 70–99)
Glucose-Capillary: 326 mg/dL — ABNORMAL HIGH (ref 70–99)
Glucose-Capillary: 67 mg/dL — ABNORMAL LOW (ref 70–99)
Glucose-Capillary: 184 mg/dL — ABNORMAL HIGH (ref 70–99)
Glucose-Capillary: 222 mg/dL — ABNORMAL HIGH (ref 70–99)

## 2020-07-01 LAB — COMPREHENSIVE METABOLIC PANEL
ALT: 15 U/L (ref 0–44)
AST: 19 U/L (ref 15–41)
Albumin: 2.9 g/dL — ABNORMAL LOW (ref 3.5–5.0)
Alkaline Phosphatase: 91 U/L (ref 38–126)
Anion gap: 12 (ref 5–15)
BUN: 35 mg/dL — ABNORMAL HIGH (ref 6–20)
CO2: 27 mmol/L (ref 22–32)
Calcium: 9.1 mg/dL (ref 8.9–10.3)
Chloride: 102 mmol/L (ref 98–111)
Creatinine, Ser: 6.92 mg/dL — ABNORMAL HIGH (ref 0.44–1.00)
GFR, Estimated: 7 mL/min — ABNORMAL LOW (ref >60)
Glucose, Bld: 122 mg/dL — ABNORMAL HIGH (ref 70–99)
Potassium: 3.8 mmol/L (ref 3.5–5.1)
Sodium: 141 mmol/L (ref 135–145)
Total Bilirubin: 0.4 mg/dL (ref 0.3–1.2)
Total Protein: 6.6 g/dL (ref 6.5–8.1)

## 2020-07-01 LAB — MAGNESIUM: Magnesium: 1.9 mg/dL (ref 1.7–2.4)

## 2020-07-01 MED ORDER — INSULIN ASPART 100 UNIT/ML ~~LOC~~ SOLN
0.0000 [IU] | Freq: Three times a day (TID) | SUBCUTANEOUS | Status: DC
  Administered 2020-07-01: 7 [IU] via SUBCUTANEOUS
  Administered 2020-07-02 (×2): 1 [IU] via SUBCUTANEOUS

## 2020-07-01 MED ORDER — LOSARTAN POTASSIUM 50 MG PO TABS
50.0000 mg | ORAL_TABLET | Freq: Every day | ORAL | Status: DC
  Administered 2020-07-01 – 2020-07-02 (×2): 50 mg via ORAL
  Filled 2020-07-01 (×2): qty 1

## 2020-07-01 NOTE — Progress Notes (Signed)
PROGRESS NOTE  [name] UTM:[id number] DOB: 1981-08-04 DOA: 06/30/2020 PCP: [facility]   LOS: 0 days   Brief narrative: As per HPI,  39 year old female with past medical history of end-stage renal disease on hemodialysis, hypertension, right eye blindness, diabetes mellitus type 2 who presented to [facility] by EMS as a code stroke.  Patient was very lethargic and unable to answer questions appropriately initially.  According to ED staff and the family patient was unwell the evening of 11/17. Patient's husband called EMS after patient was suddenly slow to respond. According to EMS, patient seemed to also have a having trouble comprehending and speaking. Paramedics were also initially concerned about possible initial right-sided weakness and patient was evaluated at [facility] emergency department as a code stroke. Patient was promptly evaluated by Dr. Rory Percy with neurology who recommended MRI brain  And felt clinically that patient was more likely to suffer from encephalopathy rather than stroke.  Patient was then placed in observation in hospital.  Assessment/Plan:  Principal Problem:   Acute encephalopathy Active Problems:   Anemia of chronic disease   ESRD on hemodialysis ([place])   Type 2 diabetes mellitus with proliferative retinopathy, with long-term current use of insulin (HCC)   Hypertensive urgency   Blindness of right eye  Acute encephalopathy.  Patient has slowness in his speech with difficulty finding words.  Still appears to be mildly confused. Initially called in as a code stroke.    Otherwise nonfocal exam..  Possible metabolic/hypertensive encephalopathy.  Blood pressure was as high as 217/ 86 on presentation.    No antihypertensive medications are noted in the medication reconciliation.   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 Currently on  hydralazine as needed for blood pressure.  Been seen by physical therapy recommend home PT and OT on discharge.  Accelerated blood pressure on admission.  Latest blood pressure of 157/86.  Patient will definitely benefit from antihypertensive medication in the long-term.  Add losartan starting today.  End-stage renal disease on hemodialysis.  Nephrology on board for hemodialysis.  Tuesday Thursday Saturday schedule.  We will start the patient on losartan 50 mg starting today.  Closely monitor.  Anemia of chronic kidney disease.  Closely monitor.  No acute issues.  Diabetes mellitus type 2 with retinopathy.  Long-term use of insulin.  Continue sliding scale insulin, Accu-Cheks diabetic diet.  Home insulin regimen once p.o. intake.  Hemoglobin A1c of 9.9.  Was 11.46-month back.  Patient takes her Lantus 20 units daily and short acting insulin 3 times daily with meals.  Currently on sliding scale insulin and will add 8 units of Lantus today.  DVT prophylaxis: heparin injection 5,000 Units Start: 06/30/20 0900   Code Status: Full code  Family Communication: Spoke with the patient's family at bedside.  Unable to reach the patient husband on the phone today  Status is: Observation  The patient will require care spanning > 2 midnights and should be moved to inpatient because: Inpatient level of care appropriate due to severity of illness and Encephalopathy   Dispo: The patient is from: Home              Anticipated d/c is to: Home              Anticipated d/c date is: 1 to 2 days.  Add antihypertensives today.  Closely monitor for neurological deficits.  Patient currently is not medically stable to d/c.  Consultants:  Neurology  Procedures:  MRI of the brain  Antibiotics:  . None  Anti-infectives (From admission, onward)   None     Subjective: Today, patient was seen and examined at bedside.  Patient is still little confused with  slowness in his speech and having  difficulty finding words, generalized weakness   Objective: Vitals:   07/01/20 0349 07/01/20 0801  BP: (!) 151/73 110/60  Pulse: 84 87  Resp: 20 15  Temp: 99 F (37.2 C) 97.7 F (36.5 C)  SpO2: 100% 100%    Intake/Output Summary (Last 24 hours) at 07/01/2020 1152 Last data filed at 06/30/2020 1708 Gross per 24 hour  Intake --  Output 2750 ml  Net -2750 ml   Filed Weights   06/30/20 0139 06/30/20 1315 06/30/20 1708  Weight: 82.6 kg 75.5 kg 72.8 kg   Body mass index is 30.33 kg/m.   Physical Exam: GENERAL: Patient is alert awake and communicative, mildly confused, difficulty expressing words, slow to respond, not in obvious distress. HENT: No scleral pallor or icterus. Pupils equally reactive to light. Oral mucosa is moist NECK: is supple, no gross swelling noted. CHEST: Clear to auscultation. No crackles or wheezes.  Diminished breath sounds bilaterally. CVS: S1 and S2 heard, no murmur. Regular rate and rhythm.  ABDOMEN: Soft, non-tender, bowel sounds are present. EXTREMITIES: No edema. CNS: Expressive aphasia, slowed respond, mildly confused, communicative, moving extremities but but with generalized weakness SKIN: warm and dry without rashes.  Data Review: I have personally reviewed the following laboratory data and studies,  CBC: Recent Labs  Lab 06/30/20 0230 06/30/20 0237 06/30/20 1841  WBC 6.7  --  6.7  NEUTROABS 4.7  --   --   HGB 9.1* 10.2* 9.7*  HCT 32.1* 30.0* 29.6*  MCV 101.6*  --  88.6  PLT 163  --  197   Basic Metabolic Panel: Recent Labs  Lab 06/30/20 0230 06/30/20 0237 06/30/20 1106 07/01/20 0316  NA 135 133* 136 141  K 3.4* 3.3* 3.9 3.8  CL 89* 91* 91* 102  CO2 30  --  31 27  GLUCOSE 416* 405* 433* 122*  BUN 47* 45* 56* 35*  CREATININE 10.06* 10.20* 11.16* 6.92*  CALCIUM 9.9  --  9.3 9.1  MG  --   --   --  1.9   Liver Function Tests: Recent Labs  Lab 06/30/20 0230 06/30/20 1106 07/01/20 0316  AST 17 16 19   ALT 12 12 15    ALKPHOS 105 117 91  BILITOT 0.4 0.6 0.4  PROT 7.2 7.1 6.6  ALBUMIN 3.3* 3.2* 2.9*   No results for input(s): LIPASE, AMYLASE in the last 168 hours. No results for input(s): AMMONIA in the last 168 hours. Cardiac Enzymes: No results for input(s): CKTOTAL, CKMB, CKMBINDEX, TROPONINI in the last 168 hours. BNP (last 3 results) No results for input(s): BNP in the last 8760 hours.  ProBNP (last 3 results) No results for input(s): PROBNP in the last 8760 hours.  CBG: Recent Labs  Lab 07/01/20 0350 07/01/20 0617 07/01/20 0636 07/01/20 0653 07/01/20 1138  GLUCAP 128* 48* 56* 111* 326*   Recent Results (from the past 240 hour(s))  Respiratory Panel by RT PCR (Flu A&B, Covid) - Nasopharyngeal Swab     Status: None   Collection Time: 06/30/20  3:47 AM   Specimen: Nasopharyngeal Swab  Result Value Ref Range Status   SARS Coronavirus 2 by RT PCR NEGATIVE  NEGATIVE Final    Comment: (NOTE) SARS-CoV-2 target nucleic acids are NOT DETECTED.  The SARS-CoV-2 RNA is generally detectable in upper respiratoy specimens during the acute phase of infection. The lowest concentration of SARS-CoV-2 viral copies this assay can detect is 131 copies/mL. A negative result does not preclude SARS-Cov-2 infection and should not be used as the sole basis for treatment or other patient management decisions. A negative result may occur with  improper specimen collection/handling, submission of specimen other than nasopharyngeal swab, presence of viral mutation(s) within the areas targeted by this assay, and inadequate number of viral copies (<131 copies/mL). A negative result must be combined with clinical observations, patient history, and epidemiological information. The expected result is Negative.  Fact Sheet for Patients:  PinkCheek.be  Fact Sheet for Healthcare Providers:  GravelBags.it  This test is no t yet approved or cleared by the  [place] FDA and  has been authorized for detection and/or diagnosis of SARS-CoV-2 by FDA under an Emergency Use Authorization (EUA). This EUA will remain  in effect (meaning this test can be used) for the duration of the COVID-19 declaration under Section 564(b)(1) of the Act, 21 U.S.C. section 360bbb-3(b)(1), unless the authorization is terminated or revoked sooner.     Influenza A by PCR NEGATIVE NEGATIVE Final   Influenza B by PCR NEGATIVE NEGATIVE Final    Comment: (NOTE) The Xpert Xpress SARS-CoV-2/FLU/RSV assay is intended as an aid in  the diagnosis of influenza from Nasopharyngeal swab specimens and  should not be used as a sole basis for treatment. Nasal washings and  aspirates are unacceptable for Xpert Xpress SARS-CoV-2/FLU/RSV  testing.  Fact Sheet for Patients: PinkCheek.be  Fact Sheet for Healthcare Providers: GravelBags.it  This test is not yet approved or cleared by the [place] FDA and  has been authorized for detection and/or diagnosis of SARS-CoV-2 by  FDA under an Emergency Use Authorization (EUA). This EUA will remain  in effect (meaning this test can be used) for the duration of the  Covid-19 declaration under Section 564(b)(1) of the Act, 21  U.S.C. section 360bbb-3(b)(1), unless the authorization is  terminated or revoked. Performed at [facility], [place] [address], [place], [place] [address]      Studies: MR BRAIN WO CONTRAST  Result Date: 06/30/2020 CLINICAL DATA:  Acute aphasia EXAM: MRI HEAD WITHOUT CONTRAST TECHNIQUE: Multiplanar, multiecho pulse sequences of the brain and surrounding structures were obtained without intravenous contrast. COMPARISON:  Head CT from earlier today FINDINGS: Brain: No acute infarction, hemorrhage, hydrocephalus, extra-axial collection or mass lesion. The cerebellar abnormality inferiorly on the left is from remote infarct. Small remote left  occipital cortex infarct. There is periventricular FLAIR hyperintensity with 2 discrete remote corpus callosum insults with crossing wallerian degeneration. Although young, the patient has significant vascular risk factors in this is likely sequela of small-vessel disease. Normal brain volume. Vascular: Normal flow voids. Skull and upper cervical spine: Normal marrow signal Sinuses/Orbits: Bilateral cataract resection. 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 IMPRESSION: 1. No acute finding. 2. 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 3. Chronic white matter disease. Electronically Signed   By: Monte Fantasia M.D.   On: 06/30/2020 07:02   DG CHEST PORT 1 VIEW  Result Date: 06/30/2020 CLINICAL DATA:  Volume overload EXAM: PORTABLE CHEST 1 VIEW COMPARISON:  02/05/2020 FINDINGS: The heart size and mediastinal contours are within normal limits. Both lungs are clear. The  visualized skeletal structures are unremarkable. IMPRESSION: Normal study. Electronically Signed   By: Rolm Baptise M.D.   On: 06/30/2020 18:33   CT HEAD CODE STROKE WO CONTRAST  Result Date: 06/30/2020 CLINICAL DATA:  Code stroke.  Initial evaluation for acute aphasia. EXAM: CT HEAD WITHOUT CONTRAST TECHNIQUE: Contiguous axial images were obtained from the base of the skull through the vertex without intravenous contrast. COMPARISON:  None available. FINDINGS: Brain: Somewhat advanced cerebral atrophy for age. Patchy hypodensity within the periventricular white matter, nonspecific, but most commonly seen with chronic microvascular ischemic disease. Probable small remote left occipital lobe infarct, left PCA distribution. Scattered hypodensity involving the left cerebellum noted, consistent with ischemic changes. Findings are age indeterminate, and could be subacute or chronic in nature. No other acute large vessel territory infarct. No intracranial hemorrhage. No mass  lesion, midline shift or mass effect. No hydrocephalus or extra-axial fluid collection. Vascular: No hyperdense vessel. Skull: Scalp soft tissues and calvarium within normal limits. Sinuses/Orbits: Layering hyperdensity noted at the posterior vitreous of the right globe (series 3, image 3), indeterminate. Similar but more subtle changes seen at the left globe. Globes and orbital soft tissues demonstrate no other acute finding. Visualized paranasal sinuses are clear. Chronic appearing left mastoid effusion. Other: None. ASPECTS [facility] Stroke Program Early CT Score) - Ganglionic level infarction (caudate, lentiform nuclei, internal capsule, insula, M1-M3 cortex): 7 - Supraganglionic infarction (M4-M6 cortex): 3 Total score (0-10 with 10 being normal): 10 IMPRESSION: 1. Patchy hypodensity within the left cerebellum, age indeterminate, but could reflect subacute to chronic ischemic changes. No intracranial hemorrhage. 2. ASPECTS is 10. 3. Probable small remote left PCA territory infarct. 4. Advanced cerebral atrophy with chronic small vessel ischemic disease for age. 5. Layering hyperdensity within the posterior vitreous chambers bilaterally, indeterminate. Correlation with physical exam recommended. These results were communicated to Dr. Rory Percy at [facility] 11/18/2021by text page via the [facility] messaging system. Electronically Signed   By: Jeannine Boga M.D.   On: 06/30/2020 01:43      Flora Lipps, MD  [facility] 07/01/2020

## 2020-07-01 NOTE — Progress Notes (Signed)
[facility] Progress Note   Subjective:  Had HD yesterday - net UF 2.7L  Seen in room, sister visiting, still seems confused, slow to respond   Objective Vitals:   06/30/20 2000 06/30/20 2358 07/01/20 0349 07/01/20 0801  BP: (!) 108/56 (!) 130/57 (!) 151/73 110/60  Pulse: 87 86 84 87  Resp: 18 18 20 15   Temp: 98.5 F (36.9 C) 98.7 F (37.1 C) 99 F (37.2 C) 97.7 F (36.5 C)  TempSrc: Oral Oral Oral Oral  SpO2: 100% 99% 100% 100%  Weight:      Height:         Additional Objective Labs: Basic Metabolic Panel: Recent Labs  Lab 06/30/20 0230 06/30/20 0230 06/30/20 0237 06/30/20 1106 07/01/20 0316  NA 135   < > 133* 136 141  K 3.4*   < > 3.3* 3.9 3.8  CL 89*   < > 91* 91* 102  CO2 30  --   --  31 27  GLUCOSE 416*   < > 405* 433* 122*  BUN 47*   < > 45* 56* 35*  CREATININE 10.06*   < > 10.20* 11.16* 6.92*  CALCIUM 9.9  --   --  9.3 9.1   < > = values in this interval not displayed.   CBC: Recent Labs  Lab 06/30/20 0230 06/30/20 0237 06/30/20 1841  WBC 6.7  --  6.7  NEUTROABS 4.7  --   --   HGB 9.1* 10.2* 9.7*  HCT 32.1* 30.0* 29.6*  MCV 101.6*  --  88.6  PLT 163  --  151   Blood Culture    Component Value Date/Time   SDES BLOOD LEFT ANTECUBITAL 02/23/2019 0712   SPECREQUEST  02/23/2019 2025    AEROBIC BOTTLE ONLY Blood Culture results may not be optimal due to an inadequate volume of blood received in culture bottles   CULT  02/23/2019 0712    NO GROWTH 5 DAYS Performed at [facility] [address], [place], [place] [address]    REPTSTATUS 02/28/2019 FINAL 02/23/2019 2376     Physical Exam General: slow to answer questions, nad  Heart: Regular, no m,r,g Lungs: clear bilaterally  Abdomen: soft non-tender  Extremities: no LE edema  Dialysis Access: R AVF +bruit   Medications:  . Chlorhexidine Gluconate Cloth  6 each Topical Q0600  . cinacalcet  90 mg Oral Q supper  . heparin  5,000 Units Subcutaneous Q8H  . insulin  aspart  0-9 Units Subcutaneous TID WC  . insulin detemir  8 Units Subcutaneous QHS  . sodium chloride flush  3 mL Intravenous Once   Dialysis Orders:  [facility] 3h 45 min EDW 68 kg 2K/2Ca UFP 2 Hep 4000 R AVF Hectorol 8 TIW Venofer 50 q wk Mircera 75 q 4 wks (last 11/9)  Assessment/Plan: 1. AMS/weakness. Code stroke on admission. No acute CVA on MRI.  More likely acute encephalopathy Not likely uremic. Has not missed any HD. ?HTN encephalopathy. Per primary. 2. ESRD -  HD TTS. Next HD 11/20   3. Hypertension/volume  - Significant HTN with SBPs >200 on admission. Improved. BP usually well controlled. Does not appear to be on any antiHTN meds at home. Per primary.  4. Anemia  - Hgb 9.1 On ESA as outpatient -follow trends.  5. Metabolic bone disease -  Continue Renvela binder, Sensipar when eating. Hold hectorol for now [place]  6. DM - insulin per primary   Lynnda Child  PA-C [facility] 07/01/2020,11:29 AM

## 2020-07-01 NOTE — Evaluation (Signed)
Occupational Therapy Evaluation Patient Details Name: [name] MRN: [id number] DOB: 07-11-1981 Today's Date: 07/01/2020    History of Present Illness Pt is a 39 y/o female admitted secondary to acute encephalopathy. PMH includes ESRD on HD, DM, HTN, and R eye blindness.    Clinical Impression   PTA, pt was living at home with her husband, pt reports she was independent with ADL/IADL and functional mobility. Pt currently demonstrates cognitive limitations and physical limitations impacting safety with ADL/IADL and functional mobility. Pt demonstrates limitations with short term memory, attention, processing speed, etc and visual limitations. She demonstrates limitations with stability and decreased stride length requiring increased time for mobility this session. Due to decline in current level of function, pt would benefit from acute OT to address established goals to facilitate safe D/C to venue listed below. At this time, recommend HHOT and 24/7 physical assistance follow-up. Will continue to follow acutely.     Follow Up Recommendations  Home health OT;Supervision/Assistance - 24 hour    Equipment Recommendations  3 in 1 bedside commode    Recommendations for Other Services       Precautions / Restrictions Precautions Precautions: Fall Restrictions Weight Bearing Restrictions: No      Mobility Bed Mobility Overal bed mobility: Needs Assistance Bed Mobility: Supine to Sit;Sit to Supine     Supine to sit: Min guard Sit to supine: Supervision   General bed mobility comments: minguard for safety, HOB elevated    Transfers Overall transfer level: Needs assistance Equipment used: 1 person hand held assist Transfers: Sit to/from Stand Sit to Stand: Min assist         General transfer comment: minA for safety and stability    Balance Overall balance assessment: Needs assistance Sitting-balance support: No upper extremity supported;Feet supported Sitting  balance-Leahy Scale: Fair     Standing balance support: Single extremity supported;During functional activity Standing balance-Leahy Scale: Poor Standing balance comment: Reliant on at least 1 UE support;pt taking small shuffling steps decreased foot clearance and decreased stride length                            ADL either performed or assessed with clinical judgement   ADL Overall ADL's : Needs assistance/impaired Eating/Feeding: Set up;Sitting   Grooming: Set up;Sitting   Upper Body Bathing: Set up;Sitting   Lower Body Bathing: Min guard;Sit to/from stand   Upper Body Dressing : Set up;Sitting   Lower Body Dressing: Min guard;Sit to/from stand Lower Body Dressing Details (indicate cue type and reason): pt donned socks sitting EOB Toilet Transfer: Ambulation;Minimal assistance Toilet Transfer Details (indicate cue type and reason): simulated short distance around edge of bed Toileting- Clothing Manipulation and Hygiene: Sit to/from stand;Minimal assistance       Functional mobility during ADLs: Minimal assistance General ADL Comments: minA for safety, pt demonstrated preference for furniture walking. Pt limited by cognition, decreased perception, generalized weakness     Vision Baseline Vision/History:  (blind in R eye, L eye blurry vision) Patient Visual Report: No change from baseline Vision Assessment?: Vision impaired- to be further tested in functional context Additional Comments: assessed functionally, pt required large print bolded numbers for written phone number for dietary. Pt unable to read phone number on menu     Perception Perception Perception Tested?:  (further assess) Comments: pt demonstrated difficulty with coordination and perception, often undershooting when reaching for objects   Praxis      Pertinent Vitals/Pain Pain  Assessment: No/denies pain     Hand Dominance Left   Extremity/Trunk Assessment Upper Extremity Assessment Upper  Extremity Assessment: Generalized weakness   Lower Extremity Assessment Lower Extremity Assessment: Defer to PT evaluation   Cervical / Trunk Assessment Cervical / Trunk Assessment: Normal   Communication Communication Communication: Expressive difficulties   Cognition Arousal/Alertness: Awake/alert Behavior During Therapy: Flat affect Overall Cognitive Status: Impaired/Different from baseline Area of Impairment: Orientation;Memory;Following commands;Problem solving;Awareness;Attention;Safety/judgement                 Orientation Level: Disoriented to;Situation Current Attention Level: Sustained Memory: Decreased short-term memory Following Commands: Follows one step commands with increased time Safety/Judgement: Decreased awareness of safety (pt required assistance to doff linens prior to standing) Awareness: Emergent Problem Solving: Slow processing;Decreased initiation;Difficulty sequencing;Requires verbal cues;Requires tactile cues General Comments: pt oriented to self, demonstrated word finding difficulties and requiring extended time for processing information. Pt completed medicog assessment to assess short term memory, visuospatial skills, medication management: pt scored 2/10 indicating limitations with organization, short term memory, working memory, visuospatial limitations etc, required for safe completion of IADL. Pt's score indicates that she would need assistance with IADL such as medication management, cooking, driving, etc.    General Comments  VSS, no family present during session    Exercises     Shoulder Instructions      Home Living Family/patient expects to be discharged to:: Private residence Living Arrangements: Spouse/significant other Available Help at Discharge: Family Type of Home: Apartment Home Access: Stairs to enter Technical brewer of Steps: flight Entrance Stairs-Rails: Left Home Layout: One level     Bathroom Shower/Tub:  Tub/shower unit         Home Equipment: None   Additional Comments: pt reports her sister is able to assist her during the day while her husband is working      Prior Functioning/Environment Level of Independence: Independent                 OT Problem List: Decreased strength;Decreased activity tolerance;Impaired balance (sitting and/or standing);Decreased coordination;Decreased cognition;Decreased safety awareness;Decreased knowledge of use of DME or AE;Impaired vision/perception      OT Treatment/Interventions: Self-care/ADL training;Therapeutic exercise;Energy conservation;DME and/or AE instruction;Therapeutic activities;Cognitive remediation/compensation;Visual/perceptual remediation/compensation;Patient/family education;Balance training    OT Goals(Current goals can be found in the care plan section) Acute Rehab OT Goals Patient Stated Goal: none stated OT Goal Formulation: With patient Time For Goal Achievement: 07/15/20 Potential to Achieve Goals: Good ADL Goals Pt Will Perform Grooming: with modified independence;standing Pt Will Perform Lower Body Dressing: with modified independence;sit to/from stand Additional ADL Goal #1: Pt will complete 3 step multistep cognition task with <3 errors for safe completion of ADL/IADL. Additional ADL Goal #2: Pt will demonstrate anticipatory awareness for safe completion of ADL/IADL in home environment.  OT Frequency: Min 2X/week   Barriers to D/C:            Co-evaluation              AM-PAC OT "6 Clicks" Daily Activity     Outcome Measure Help from another person eating meals?: A Little Help from another person taking care of personal grooming?: A Little Help from another person toileting, which includes using toliet, bedpan, or urinal?: A Little Help from another person bathing (including washing, rinsing, drying)?: A Little Help from another person to put on and taking off regular upper body clothing?: A  Little Help from another person to put on and taking off regular lower body clothing?:  A Little 6 Click Score: 18   End of Session Equipment Utilized During Treatment: Gait belt Nurse Communication: Mobility status (expressive difficulties)  Activity Tolerance: Patient tolerated treatment well Patient left: in bed;with bed alarm set;with call bell/phone within reach  OT Visit Diagnosis: Unsteadiness on feet (R26.81);Other abnormalities of gait and mobility (R26.89);Muscle weakness (generalized) (M62.81);Other symptoms and signs involving cognitive function                Time: 3672-5500 OT Time Calculation (min): 26 min Charges:  OT General Charges $OT Visit: 1 Visit OT Evaluation $OT Eval Moderate Complexity: 1 Mod OT Treatments $Self Care/Home Management : 8-22 mins  Helene Kelp OTR/L Acute Rehabilitation Services Office: [place] 07/01/2020, 9:44 AM

## 2020-07-01 NOTE — TOC Initial Note (Signed)
Transition of Care [facility]) - Initial/Assessment Note    Patient Details  Name: [name] MRN: [id number] Date of Birth: [birth year]  [facility] CM/SW Contact:    Pollie Friar, RN Phone Number: 07/01/2020, 1:59 PM  Clinical Narrative:                 Pt is from home with spouse. CM met with the patient, her sister and spouse (over the phone). Per family they can provide needed supervision. No transportation issues. Pt is already active with [facility] and sister can provide other needed transport. No issues with home medications. Recommendations are for [facility] services but pt will not receive much therapy d/t her medicaid. Pt and family interested in outpatient rehab. Orders in Epic and information on the AVS. DME ordered and will be delivered to the room per [employer]. TOC following.  Expected Discharge Plan: OP Rehab Barriers to Discharge: Continued Medical Work up   Patient Goals and CMS Choice   CMS Medicare.gov Compare Post Acute Care list provided to:: Patient Represenative (must comment) Choice offered to / list presented to : Patient, Spouse  Expected Discharge Plan and Services Expected Discharge Plan: OP Rehab   Discharge Planning Services: CM Consult Post Acute Care Choice: Durable Medical Equipment Living arrangements for the past 2 months: Apartment                 DME Arranged: 3-N-1, Walker rolling DME Agency: [employer] Date DME Agency Contacted: 07/01/20   Representative spoke with at DME Agency: Freda Munro            Prior Living Arrangements/Services Living arrangements for the past 2 months: Apartment Lives with:: Spouse Patient language and need for interpreter reviewed:: Yes Do you feel safe going back to the place where you live?: Yes      Need for Family Participation in Patient Care: Yes (Comment) Care giver support system in place?: Yes (comment)   Criminal Activity/Legal Involvement Pertinent to Current Situation/Hospitalization:  No - Comment as needed  Activities of Daily Living Home Assistive Devices/Equipment: None ADL Screening (condition at time of admission) Patient's cognitive ability adequate to safely complete daily activities?: Yes Is the patient deaf or have difficulty hearing?: No Does the patient have difficulty seeing, even when wearing glasses/contacts?: No Does the patient have difficulty concentrating, remembering, or making decisions?: No Patient able to express need for assistance with ADLs?: No Does the patient have difficulty dressing or bathing?: No Independently performs ADLs?: Yes (appropriate for developmental age) Does the patient have difficulty walking or climbing stairs?: No Weakness of Legs: None Weakness of Arms/Hands: None  Permission Sought/Granted                  Emotional Assessment Appearance:: Appears stated age Attitude/Demeanor/Rapport: Engaged Affect (typically observed): Accepting Orientation: : Oriented to Self, Oriented to Place, Oriented to  Time, Oriented to Situation (dysarthria)   Psych Involvement: No (comment)  Admission diagnosis:  Metabolic encephalopathy [W09.81] Toxic metabolic encephalopathy [X91.4] Patient Active Problem List   Diagnosis Date Noted  . Acute encephalopathy 06/30/2020  . Hypertensive urgency 06/30/2020  . Blindness of right eye 06/30/2020  . Type 2 diabetes mellitus with hyperglycemia, with long-term current use of insulin ([place]) 05/13/2020  . Type 2 diabetes mellitus with proliferative retinopathy, with long-term current use of insulin ([place]) 05/13/2020  . Fever   . Hyperglycemia due to type 2 diabetes mellitus ([place]) 02/21/2019  . Suspected COVID-19 virus infection 02/21/2019  . ESRD on  hemodialysis ([place]) 02/21/2019  . Anemia of chronic disease 02/11/2019  . Diabetes mellitus ([place]) 02/11/2019  . HLD (hyperlipidemia) 02/11/2019  . PTSD (post-traumatic stress disorder) 02/11/2019  . PVD (peripheral vascular disease) ([place])  02/11/2019  . Retinopathy 02/11/2019  . Secondary hyperparathyroidism of renal origin ([place]) 02/11/2019  . Cataracts, bilateral 01/05/2015  . Hyperphosphatemia 01/05/2015  . Status post amputation of toe of right foot ([place]) 01/05/2015   PCP:  Inc, [facility]:   [facility] [place], [place] - [place] [facility] [address] Phone: [phone] Fax: [place], [place] [address] [place] [address] Phone: [phone] Fax: [phone]     Social Determinants of Health (SDOH) Interventions    Readmission Risk Interventions No flowsheet data found.

## 2020-07-01 NOTE — Progress Notes (Signed)
SLP Cancellation Note  Patient Details Name: [name] MRN: [id number] DOB: 1981/03/13   Cancelled treatment:       Reason Eval/Treat Not Completed: Patient at procedure or test/unavailable; attempted x2 this date; ST will continue efforts.   Elvina Sidle, M.S., [place] 07/01/2020, 10:35 AM

## 2020-07-01 NOTE — Progress Notes (Signed)
Hypoglycemic Event  CBG: 48  Treatment: Oral intake  Symptoms: Sweaty, fatigue  Follow-up CBG: Time: 0223 CBG Result:111  Possible Reasons for Event: 2 units of Insulin administered at 0400   Comments/MD notified: T. Opyd via page    Philomena Course

## 2020-07-01 NOTE — Progress Notes (Signed)
Physical Therapy Treatment Patient Details Name: [name] MRN: [id number] DOB: 05-13-1981 Today's Date: 07/01/2020    History of Present Illness Pt is a 39 y/o female admitted secondary to acute encephalopathy. PMH includes ESRD on HD, DM, HTN, and R eye blindness.     PT Comments    Pt tolerates treatment well, with improved activity tolerance. Pt continues to demonstrate poor memory and significantly increased processing time during session. Pt expresses frustration at times due to cognitive deficits during session. Pt demonstrates slowed movement and impaired foot clearance at all times during ambulation despite PT cues. Pt currently requires physical assistance for all mobility and verbal cues for direction due to pt's vision impairments. PT recommends discharge home with HHPT, a RW, and 24/7 assistance from family for all OOB mobility.   Follow Up Recommendations  Home health PT;Supervision/Assistance - 24 hour     Equipment Recommendations  Rolling walker with 5" wheels    Recommendations for Other Services       Precautions / Restrictions Precautions Precautions: Fall Restrictions Weight Bearing Restrictions: No    Mobility  Bed Mobility Overal bed mobility: Needs Assistance Bed Mobility: Supine to Sit;Sit to Supine     Supine to sit: Min guard Sit to supine: Supervision      Transfers Overall transfer level: Needs assistance Equipment used: Rolling walker (2 wheeled);1 person hand held assist Transfers: Sit to/from [facility] Sit to Stand: Min guard Stand pivot transfers: Min assist       General transfer comment: minA for SPT to guide pt to surface due to vision deficits  Ambulation/Gait Ambulation/Gait assistance: Min assist Gait Distance (Feet): 20 Feet Assistive device: Rolling walker (2 wheeled) Gait Pattern/deviations: Step-to pattern Gait velocity: reduced Gait velocity interpretation: <1.31 ft/sec, indicative of household  ambulator General Gait Details: pt with short step to gait, pt requires cues for direction due to impaired vision. PT attempts to encourage increased step length however the pt is able to minimally clear and advance BLE   Stairs             Wheelchair Mobility    Modified Rankin (Stroke Patients Only)       Balance Overall balance assessment: Needs assistance Sitting-balance support: No upper extremity supported;Feet supported Sitting balance-Leahy Scale: Fair     Standing balance support: Bilateral upper extremity supported Standing balance-Leahy Scale: Poor Standing balance comment: reliant on BUE support of minA                            Cognition Arousal/Alertness: Awake/alert Behavior During Therapy: Flat affect Overall Cognitive Status: Impaired/Different from baseline Area of Impairment: Orientation;Attention;Memory;Following commands;Safety/judgement;Awareness;Problem solving                 Orientation Level: Disoriented to;Situation Current Attention Level: Sustained Memory: Decreased recall of precautions;Decreased short-term memory Following Commands: Follows one step commands with increased time Safety/Judgement: Decreased awareness of safety;Decreased awareness of deficits Awareness: Emergent Problem Solving: Slow processing;Requires verbal cues;Difficulty sequencing        Exercises      General Comments General comments (skin integrity, edema, etc.): VSS, sister present for session. Sister is inquisitive about the pt's memory deficits and how the pt is unable to remember her but can remember her nieces and nephew      Pertinent Vitals/Pain Pain Assessment: No/denies pain    Home Living  Prior Function            PT Goals (current goals can now be found in the care plan section) Acute Rehab PT Goals Patient Stated Goal: to improve mobility Progress towards PT goals: Progressing toward  goals    Frequency    Min 3X/week      PT Plan Current plan remains appropriate    Co-evaluation              AM-PAC PT "6 Clicks" Mobility   Outcome Measure  Help needed turning from your back to your side while in a flat bed without using bedrails?: None Help needed moving from lying on your back to sitting on the side of a flat bed without using bedrails?: A Little Help needed moving to and from a bed to a chair (including a wheelchair)?: A Little Help needed standing up from a chair using your arms (e.g., wheelchair or bedside chair)?: A Little Help needed to walk in hospital room?: A Little Help needed climbing 3-5 steps with a railing? : A Lot 6 Click Score: 18    End of Session Equipment Utilized During Treatment: Gait belt Activity Tolerance: Patient tolerated treatment well Patient left: in bed;with call bell/phone within reach;with bed alarm set;with family/visitor present Nurse Communication: Mobility status PT Visit Diagnosis: Difficulty in walking, not elsewhere classified (R26.2);Muscle weakness (generalized) (M62.81)     Time: 7782-4235 PT Time Calculation (min) (ACUTE ONLY): 29 min  Charges:  $Gait Training: 8-22 mins $Therapeutic Activity: 8-22 mins                     Zenaida Niece, PT, DPT Acute Rehabilitation Pager: [phone]    Zenaida Niece 07/01/2020, 1:04 PM

## 2020-07-01 NOTE — Progress Notes (Signed)
Inpatient Diabetes Program Recommendations  AACE/ADA: New Consensus Statement on Inpatient Glycemic Control (2015)  Target Ranges:  Prepandial:   less than 140 mg/dL      Peak postprandial:   less than 180 mg/dL (1-2 hours)      Critically ill patients:  140 - 180 mg/dL   Lab Results  Component Value Date   GLUCAP 111 (H) 07/01/2020   HGBA1C 9.9 (H) 06/30/2020    Review of Glycemic Control Results for [name], [name] (MRN [id number]) as of 07/01/2020 10:15  Ref. Range 07/01/2020 03:50 07/01/2020 06:17 07/01/2020 06:36 07/01/2020 06:53  Glucose-Capillary Latest Ref Range: 70 - 99 mg/dL 128 (H) 48 (L) 56 (L) 111 (H)   Diabetes history: Type 2 DM Outpatient Diabetes medications: Lantus 20 units qd, Novolog 5 units tid Current orders for Inpatient glycemic control: Novolog 0-9 units TID, Levemir 8 units qhs  Inpatient Diabetes Program Recommendations:    ESRD, Type 2 DM, encephalopathy. Sees [facility] last apt 10/1.   A1C much improved from last visit.  Noted hypoglycemia of 48 mg/dL. This was following Novolog 15 units SSI given without CBG. Correction has to be given within one hour of previous CBG to avoid hypoglycemia. Following. In agreement with current orders.   Thanks, Bronson Curb, MSN, RNC-OB Diabetes Coordinator [phone] (8a-5p)

## 2020-07-02 DIAGNOSIS — N186 End stage renal disease: Secondary | ICD-10-CM | POA: Diagnosis not present

## 2020-07-02 DIAGNOSIS — G9340 Encephalopathy, unspecified: Secondary | ICD-10-CM | POA: Diagnosis not present

## 2020-07-02 DIAGNOSIS — D638 Anemia in other chronic diseases classified elsewhere: Secondary | ICD-10-CM | POA: Diagnosis not present

## 2020-07-02 DIAGNOSIS — I160 Hypertensive urgency: Secondary | ICD-10-CM | POA: Diagnosis not present

## 2020-07-02 LAB — GLUCOSE, CAPILLARY
Glucose-Capillary: 94 mg/dL (ref 70–99)
Glucose-Capillary: 130 mg/dL — ABNORMAL HIGH (ref 70–99)
Glucose-Capillary: 111 mg/dL — ABNORMAL HIGH (ref 70–99)
Glucose-Capillary: 143 mg/dL — ABNORMAL HIGH (ref 70–99)
Glucose-Capillary: 195 mg/dL — ABNORMAL HIGH (ref 70–99)

## 2020-07-02 MED ORDER — LOSARTAN POTASSIUM 50 MG PO TABS
50.0000 mg | ORAL_TABLET | Freq: Every day | ORAL | 2 refills | Status: DC
Start: 2020-07-02 — End: 2021-10-25

## 2020-07-02 NOTE — Plan of Care (Signed)
  Problem: Education: Goal: Knowledge of patient specific risk factors addressed and post discharge goals established will improve 07/02/2020 0642 by Marcos Eke, RN Outcome: Adequate for Discharge 07/02/2020 0640 by Marcos Eke, RN Outcome: Progressing   Problem: Education: Goal: Knowledge of secondary prevention will improve 07/02/2020 0642 by Marcos Eke, RN Outcome: Progressing 07/02/2020 0640 by Marcos Eke, RN Outcome: Progressing

## 2020-07-02 NOTE — Progress Notes (Signed)
Patient ambulating in room with PT.  Complaining of "aching" pain in anterior aspect of RL leg from knee to right dorsal foot.  Able to bear weight, but states is still painful.  MD notified.

## 2020-07-02 NOTE — Progress Notes (Signed)
Patient left the unit at 2145 pm in wheelchair, her husband pick her up from  [address] [facility] entrance.

## 2020-07-02 NOTE — Progress Notes (Signed)
Physical Therapy Treatment Patient Details Name: [name] MRN: [id number] DOB: 1981-07-28 Today's Date: 07/02/2020    History of Present Illness Pt is a 39 y/o female admitted secondary to acute encephalopathy. PMH includes ESRD on HD, DM, HTN, and R eye blindness.     PT Comments    Pt tolerates treatment well with improved gait and transfer quality. Pt able to increase gait speed and progress to a step through gait pattern this session. Pt also demonstrates improved standing balance during session, donning mask without UE support. Pt does continue to demonstrate slowed processing and ability to produce speech at times, although intermittently is able to quickly produce short phrases this session. Pt will benefit from continued acute PT POC during this admission to improve mobility quality. PT recommends outpatient PT services, a RW (pt already received this device, present in room), and 24/7 assistance from family.  Follow Up Recommendations  Outpatient PT;Supervision/Assistance - 24 hour     Equipment Recommendations  Rolling walker with 5" wheels    Recommendations for Other Services       Precautions / Restrictions Precautions Precautions: Fall Restrictions Weight Bearing Restrictions: No    Mobility  Bed Mobility Overal bed mobility: Modified Independent Bed Mobility: Supine to Sit;Sit to Supine     Supine to sit: Modified independent (Device/Increase time) Sit to supine: Modified independent (Device/Increase time)      Transfers Overall transfer level: Needs assistance Equipment used: Rolling walker (2 wheeled) Transfers: Sit to/from Stand Sit to Stand: Supervision            Ambulation/Gait Ambulation/Gait assistance: Min guard Gait Distance (Feet): 50 Feet Assistive device: Rolling walker (2 wheeled) Gait Pattern/deviations: Step-to pattern Gait velocity: reduced Gait velocity interpretation: <1.31 ft/sec, indicative of household  ambulator General Gait Details: pt with short step-to gait, progressing to step through gait with PT verbal cues   Stairs             Wheelchair Mobility    Modified Rankin (Stroke Patients Only)       Balance Overall balance assessment: Needs assistance Sitting-balance support: No upper extremity supported;Feet supported Sitting balance-Leahy Scale: Good     Standing balance support: No upper extremity supported;During functional activity Standing balance-Leahy Scale: Fair Standing balance comment: supervision to don mask in standing                            Cognition Arousal/Alertness: Awake/alert Behavior During Therapy: Flat affect Overall Cognitive Status: Impaired/Different from baseline Area of Impairment: Problem solving                             Problem Solving: Slow processing        Exercises      General Comments General comments (skin integrity, edema, etc.): VSS, spouse present during session      Pertinent Vitals/Pain Pain Assessment: No/denies pain Pain Score: 4  Pain Location: right knee Pain Descriptors / Indicators: Aching;Discomfort Pain Intervention(s): Limited activity within patient's tolerance;Monitored during session    Home Living     Available Help at Discharge: Family;Friend(s);Available 24 hours/day;Available PRN/intermittently Type of Home: Other(Comment) (townhome)              Prior Function            PT Goals (current goals can now be found in the care plan section) Acute Rehab PT Goals Patient Stated Goal:  to improve mobility Progress towards PT goals: Progressing toward goals    Frequency    Min 3X/week      PT Plan Discharge plan needs to be updated    Co-evaluation              AM-PAC PT "6 Clicks" Mobility   Outcome Measure  Help needed turning from your back to your side while in a flat bed without using bedrails?: None Help needed moving from lying on  your back to sitting on the side of a flat bed without using bedrails?: None Help needed moving to and from a bed to a chair (including a wheelchair)?: None Help needed standing up from a chair using your arms (e.g., wheelchair or bedside chair)?: None Help needed to walk in hospital room?: A Little Help needed climbing 3-5 steps with a railing? : A Lot 6 Click Score: 21    End of Session   Activity Tolerance: Patient tolerated treatment well Patient left: in bed;with call bell/phone within reach;with bed alarm set Nurse Communication: Mobility status PT Visit Diagnosis: Difficulty in walking, not elsewhere classified (R26.2);Muscle weakness (generalized) (M62.81)     Time: 6269-4854 PT Time Calculation (min) (ACUTE ONLY): 15 min  Charges:  $Gait Training: 8-22 mins                     Zenaida Niece, PT, DPT Acute Rehabilitation Pager: [phone]    Zenaida Niece 07/02/2020, 4:49 PM

## 2020-07-02 NOTE — Discharge Summary (Signed)
Physician Discharge Summary  [name] LOV:[id number] DOB: 10-15-1980 DOA: 06/30/2020  PCP: [facility]  Admit date: 06/30/2020 Discharge date: 07/02/2020  Admitted From: Home  Discharge disposition: Home    Recommendations for Outpatient Follow-Up:   . Follow up with your primary care provider in one week.  . Check CBC, BMP, magnesium in the next visit . Patient has been prescribed outpatient PT OT and speech therapy on discharge. . Monitor blood pressure closely.  Patient was thought to have acute encephalopathy from accelerated hypertension.  Has been started on losartan while in the hospital.  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  Diet recommendation: Low sodium, heart healthy.  Carbohydrate-modified.    Wound care: None.  Code status: Full.   History of Present Illness:   39 year old female with past medical history of end-stage renal disease on hemodialysis, hypertension, right eye blindness, diabetes mellitus type 2 who presented to [facility] by EMS as a code stroke.  Patient was very lethargic and unable to answer questions appropriately initially.  According to ED staff and the family patient was unwell the evening of 11/17. Patient's husband called EMS after patient was suddenly slow to respond. According to EMS, patient seemed to also have a having trouble comprehending and speaking. Paramedics were also initially concerned about possible initial right-sided weakness and patient was evaluated at [facility] emergency department as a code stroke. Patient was promptly evaluated by Dr. Rory Percy with neurology who  recommended MRI brain  And felt clinicallythat patient was more likely to suffer from encephalopathy rather than stroke.  Patient was then placed in observation in hospital.   Hospital Course:   Following conditions were addressed during hospitalization as listed below,  Acute encephalopathy.  Improving. Initially called in as a code stroke.   Otherwise nonfocal exam.. Possible metabolic/hypertensive encephalopathy. Blood pressure was as high as 217/86 on presentation.   No antihypertensive medications are noted in the medication reconciliation.  Patient has been started on losartan with improvement in her blood pressure which will be continued on discharge. 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 Patient was seen by physical therapy who recommend outpatient PT OT and speech therapy on discharge.    Accelerated blood pressure on admission.  Improved blood pressure after adding losartan.  This will be continued on discharge.  End-stage renal disease on hemodialysis.Nephrology  was consulted.  Patient underwent hemodialysis while in the hospital. She is Tuesday Thursday Saturday schedule.   Anemia of chronic kidney disease.  No need for blood transfusion.  Monitor as outpatient.  Diabetes mellitus type 2 with retinopathy.On long-term use of insulin. Hemoglobin A1c of 9.9. A1c was  11.4 one-month back.  Will resume her home insulin regimen on discharge.  Disposition.  At this time, patient is stable for disposition home.  Will need to follow-up with your primary care physician as outpatient.  Medical Consultants:    Neurology  Nephrology  Procedures:    Hemodialysis Subjective:   Today, patient seen and examined at bedside.  Has more distinctive speech and able to answer appropriately.  Improved verbal response.  Patient feels better.  Wishes to go home.  Discharge Exam:   Vitals:     07/01/20 2354 07/02/20 0400  BP: 135/61 (!) 127/54  Pulse:  65  Resp: 19 16  Temp: 98.2 F (36.8 C) 97.9 F (36.6 C)  SpO2:     Vitals:   07/01/20 1614 07/01/20 2042 07/01/20 2354 07/02/20 0400  BP: (!) 143/66 (!) 147/55 135/61 (!) 127/54  Pulse:  87  65  Resp:  _0 Temp: 98.9 F (37.2 C) 98.8 F (37.1 C) 98.2 F (36.8 C) 97.9 F (36.6 C)  TempSrc: Oral Oral Oral Oral  SpO2:  100%    Weight:    72.2 kg  Height:    _1  (1.549 m)   General: Alert awake, not in obvious distress, communicative HENT: pupils equally reacting to light,  No scleral pallor or icterus noted. Oral mucosa is moist.  Chest:  Clear breath sounds.  Diminished breath sounds bilaterally. No crackles or wheezes.  CVS: S1 &S2 heard. No murmur.  Regular rate and rhythm. Abdomen: Soft, nontender, nondistended.  Bowel sounds are heard.   Extremities: No cyanosis, clubbing or edema.  Peripheral pulses are palpable.  Right upper extremity AV fistula in place Psych: Alert, awake and oriented, normal mood, speech is distinct CNS:  No cranial nerve deficits.  Power equal in all extremities.   Skin: Warm and dry.  No rashes noted.  The results of significant diagnostics from this hospitalization (including imaging, microbiology, ancillary and laboratory) are listed below for reference.     Diagnostic Studies:   MR BRAIN WO CONTRAST  Result Date: 06/30/2020 CLINICAL DATA:  Acute aphasia EXAM: MRI HEAD WITHOUT CONTRAST TECHNIQUE: Multiplanar, multiecho pulse sequences of the brain and surrounding structures were obtained without intravenous contrast. COMPARISON:  Head CT from earlier today FINDINGS: Brain: No acute infarction, hemorrhage, hydrocephalus, extra-axial collection or mass lesion. The cerebellar abnormality inferiorly on the left is from remote infarct. Small remote left occipital cortex infarct. There is periventricular FLAIR hyperintensity with 2 discrete remote corpus callosum insults with  crossing wallerian degeneration. Although young, the patient has significant vascular risk factors in this is likely sequela of small-vessel disease. Normal brain volume. Vascular: Normal flow voids. Skull and upper cervical spine: Normal marrow signal Sinuses/Orbits: Bilateral cataract resection. 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 IMPRESSION: 1. No acute finding. 2. 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 3. Chronic white matter disease. Electronically Signed   By: Monte Fantasia M.D.   On: 06/30/2020 07:02   DG CHEST PORT 1 VIEW  Result Date: 06/30/2020 CLINICAL DATA:  Volume overload EXAM: PORTABLE CHEST 1 VIEW COMPARISON:  02/05/2020 FINDINGS: The heart size and mediastinal contours are within normal limits. Both lungs are clear. The visualized skeletal structures are unremarkable. IMPRESSION: Normal study. Electronically Signed   By: Rolm Baptise M.D.   On: 06/30/2020 18:33   CT HEAD CODE STROKE WO CONTRAST  Result Date: 06/30/2020 CLINICAL DATA:  Code stroke.  Initial evaluation for acute aphasia. EXAM: CT HEAD WITHOUT CONTRAST TECHNIQUE: Contiguous axial images were obtained from the base of the skull through the vertex without intravenous contrast. COMPARISON:  None available. FINDINGS: Brain: Somewhat advanced cerebral atrophy for age. Patchy hypodensity within the periventricular white matter, nonspecific, but most commonly seen with chronic microvascular ischemic disease. Probable small remote left occipital lobe infarct, left PCA distribution. Scattered hypodensity involving the  left cerebellum noted, consistent with ischemic changes. Findings are age indeterminate, and could be subacute or chronic in nature. No other acute large vessel territory infarct. No intracranial hemorrhage. No mass lesion, midline shift or mass effect. No hydrocephalus or extra-axial fluid collection. Vascular: No hyperdense vessel. Skull:  Scalp soft tissues and calvarium within normal limits. Sinuses/Orbits: Layering hyperdensity noted at the posterior vitreous of the right globe (series 3, image 3), indeterminate. Similar but more subtle changes seen at the left globe. Globes and orbital soft tissues demonstrate no other acute finding. Visualized paranasal sinuses are clear. Chronic appearing left mastoid effusion. Other: None. ASPECTS [facility] Stroke Program Early CT Score) - Ganglionic level infarction (caudate, lentiform nuclei, internal capsule, insula, M1-M3 cortex): 7 - Supraganglionic infarction (M4-M6 cortex): 3 Total score (0-10 with 10 being normal): 10 IMPRESSION: 1. Patchy hypodensity within the left cerebellum, age indeterminate, but could reflect subacute to chronic ischemic changes. No intracranial hemorrhage. 2. ASPECTS is 10. 3. Probable small remote left PCA territory infarct. 4. Advanced cerebral atrophy with chronic small vessel ischemic disease for age. 5. Layering hyperdensity within the posterior vitreous chambers bilaterally, indeterminate. Correlation with physical exam recommended. These results were communicated to Dr. Rory Percy at [place] 11/18/2021by text page via the [facility] messaging system. Electronically Signed   By: Jeannine Boga M.D.   On: 06/30/2020 01:43     Labs:   Basic Metabolic Panel: Recent Labs  Lab 06/30/20 0230 06/30/20 0230 06/30/20 0237 06/30/20 0237 06/30/20 1106 07/01/20 0316  NA 135  --  133*  --  136 141  K 3.4*   < > 3.3*   < > 3.9 3.8  CL 89*  --  91*  --  91* 102  CO2 30  --   --   --  31 27  GLUCOSE 416*  --  405*  --  433* 122*  BUN 47*  --  45*  --  56* 35*  CREATININE 10.06*  --  10.20*  --  11.16* 6.92*  CALCIUM 9.9  --   --   --  9.3 9.1  MG  --   --   --   --   --  1.9   < > = values in this interval not displayed.   GFR Estimated Creatinine Clearance: 10 mL/min (A) (by C-G formula based on SCr of 6.92 mg/dL (H)). Liver Function Tests: Recent Labs  Lab  06/30/20 0230 06/30/20 1106 07/01/20 0316  AST _0 ALT _1 ALKPHOS 105 117 91  BILITOT 0.4 0.6 0.4  PROT 7.2 7.1 6.6  ALBUMIN 3.3* 3.2* 2.9*   No results for input(s): LIPASE, AMYLASE in the last 168 hours. No results for input(s): AMMONIA in the last 168 hours. Coagulation profile Recent Labs  Lab 06/30/20 0230  INR 1.1    CBC: Recent Labs  Lab 06/30/20 0230 06/30/20 0237 06/30/20 1841  WBC 6.7  --  6.7  NEUTROABS 4.7  --   --   HGB 9.1* 10.2* 9.7*  HCT 32.1* 30.0* 29.6*  MCV 101.6*  --  88.6  PLT 163  --  151   Cardiac Enzymes: No results for input(s): CKTOTAL, CKMB, CKMBINDEX, TROPONINI in the last 168 hours. BNP: Invalid input(s): POCBNP CBG: Recent Labs  Lab 07/01/20 1611 07/01/20 1757 07/01/20 2135 07/02/20 0621 07/02/20 0818  GLUCAP 67* 184* 222* 94 130*   D-Dimer No results for input(s): DDIMER in the last 72 hours. Hgb A1c Recent Labs  06/30/20 1106  HGBA1C 9.9*   Lipid Profile Recent Labs    06/30/20 1106  CHOL 173  HDL 60  LDLCALC 97  TRIG 82  CHOLHDL 2.9   Thyroid function studies Recent Labs    06/30/20 1106  TSH 0.727   Anemia work up Recent Labs    06/30/20 1106  VITAMINB12 661  FOLATE 8.8   Microbiology Recent Results (from the past 240 hour(s))  Respiratory Panel by RT PCR (Flu A&B, Covid) - Nasopharyngeal Swab     Status: None   Collection Time: 06/30/20  3:47 AM   Specimen: Nasopharyngeal Swab  Result Value Ref Range Status   SARS Coronavirus 2 by RT PCR NEGATIVE NEGATIVE Final    Comment: (NOTE) SARS-CoV-2 target nucleic acids are NOT DETECTED.  The SARS-CoV-2 RNA is generally detectable in upper respiratoy specimens during the acute phase of infection. The lowest concentration of SARS-CoV-2 viral copies this assay can detect is 131 copies/mL. A negative result does not preclude SARS-Cov-2 infection and should not be used as the sole basis for treatment or other patient management decisions.  A negative result may occur with  improper specimen collection/handling, submission of specimen other than nasopharyngeal swab, presence of viral mutation(s) within the areas targeted by this assay, and inadequate number of viral copies (<131 copies/mL). A negative result must be combined with clinical observations, patient history, and epidemiological information. The expected result is Negative.  Fact Sheet for Patients:  PinkCheek.be  Fact Sheet for Healthcare Providers:  GravelBags.it  This test is no t yet approved or cleared by the [place] FDA and  has been authorized for detection and/or diagnosis of SARS-CoV-2 by FDA under an Emergency Use Authorization (EUA). This EUA will remain  in effect (meaning this test can be used) for the duration of the COVID-19 declaration under Section 564(b)(1) of the Act, 21 U.S.C. section 360bbb-3(b)(1), unless the authorization is terminated or revoked sooner.     Influenza A by PCR NEGATIVE NEGATIVE Final   Influenza B by PCR NEGATIVE NEGATIVE Final    Comment: (NOTE) The Xpert Xpress SARS-CoV-2/FLU/RSV assay is intended as an aid in  the diagnosis of influenza from Nasopharyngeal swab specimens and  should not be used as a sole basis for treatment. Nasal washings and  aspirates are unacceptable for Xpert Xpress SARS-CoV-2/FLU/RSV  testing.  Fact Sheet for Patients: PinkCheek.be  Fact Sheet for Healthcare Providers: GravelBags.it  This test is not yet approved or cleared by the [place] FDA and  has been authorized for detection and/or diagnosis of SARS-CoV-2 by  FDA under an Emergency Use Authorization (EUA). This EUA will remain  in effect (meaning this test can be used) for the duration of the  Covid-19 declaration under Section 564(b)(1) of the Act, 21  U.S.C. section 360bbb-3(b)(1), unless the  authorization is  terminated or revoked. Performed at [facility], [place] [address], [place], [place] [address]      Discharge Instructions:   Discharge Instructions    Ambulatory referral to Occupational Therapy   Complete by: As directed    Ambulatory referral to Physical Therapy   Complete by: As directed    Ambulatory referral to Speech Therapy   Complete by: As directed    Diet - low sodium heart healthy   Complete by: As directed    Discharge instructions   Complete by: As directed    Continue medications as prescribed.  Check your blood pressure daily.  You have been started  on medication for blood pressure.  Please go for physical therapy physical therapy and speech therapy as outpatient.  Follow-up with your primary care physician within 1 week.  Continue hemodialysis as outpatient.   Increase activity slowly   Complete by: As directed      Allergies as of 07/02/2020      Reactions   Morphine Rash, Other (See Comments)   Peanut-containing Drug Products Anaphylaxis, Hives   Penicillins Anaphylaxis   Chocolate Hives      Medication List    TAKE these medications   Accu-Chek FastClix Lancets Misc Use as instructed to check blood sugar up to TID. E11.22   Accu-Chek Guide Me w/Device Kit 1 kit by Does not apply route 3 (three) times daily. Use to check BG at home up to 3 times daily. E11.22   Accu-Chek Guide test strip Generic drug: glucose blood Use as instructed to check blood sugar up to TID. E11.22   ARANESP (ALBUMIN FREE) IJ Darbepoetin Alfa (Aranesp)   calcitRIOL 0.25 MCG capsule Commonly known as: ROCALTROL Take 0.25 mcg by mouth 2 (two) times daily.   Fosrenol 1000 MG Pack Generic drug: Lanthanum Carbonate Take 2,000 mg by mouth See admin instructions. Take 2000 mg ( 2 packs) by mouth with meals three times daily and 1 pack (1038m) twice daily with a snack   HAIR/SKIN/NAILS PO Take 1 capsule by mouth daily.   Insulin Pen Needle 32G X 4 MM  Misc 1 Device by Does not apply route as directed.   Lantus SoloStar 100 UNIT/ML Solostar Pen Generic drug: insulin glargine Inject 20 Units into the skin daily.   lidocaine-prilocaine cream Commonly known as: EMLA Apply 1 application topically as directed.   losartan 50 MG tablet Commonly known as: COZAAR Take 1 tablet (50 mg total) by mouth daily.   multivitamin Tabs tablet Take 1 tablet by mouth daily.   NovoLOG FlexPen 100 UNIT/ML FlexPen Generic drug: insulin aspart Inject 5 Units into the skin 3 (three) times daily with meals. Max daily 40 units with correction scale   Sensipar 90 MG tablet Generic drug: cinacalcet Take 90 mg by mouth daily.   sevelamer carbonate 0.8 g Pack packet Commonly known as: RENVELA Take 0.8 g by mouth 3 (three) times daily with meals.            Durable Medical Equipment  (From admission, onward)         Start     Ordered   07/01/20 1356  For home use only DME 3 n 1  Once        07/01/20 1355   07/01/20 1356  For home use only DME Walker rolling  Once       Question Answer Comment  Walker: With [employer]   Patient needs a walker to treat with the following condition Weakness      07/01/20 1355          Follow-up I[facility]illFollow up.   Specialty: Rehabilitation Why: The outpatient rehab will contact you for the first appointment Contact information: [address] [facility]. Schedule an appointment as soon as possible for a visit in 1 week(s).   Specialty: Pediatrics Contact information: [place] [phone]               Time coordinating  discharge: 39 minutes  Signed:  Tenasia Aull  [facility] 07/02/2020, 10:37 AM

## 2020-07-02 NOTE — Progress Notes (Signed)
[facility] Progress Note   Subjective:   Pt seen and examined at bedside, appears to be discharging today. Feeling better, says her speech is improved. Denies SOB, CP, palpitations, dizziness, GI upset.   Objective Vitals:   07/01/20 1614 07/01/20 2042 07/01/20 2354 07/02/20 0400  BP: (!) 143/66 (!) 147/55 135/61 (!) 127/54  Pulse:  87  65  Resp:  16 19 16   Temp: 98.9 F (37.2 C) 98.8 F (37.1 C) 98.2 F (36.8 C) 97.9 F (36.6 C)  TempSrc: Oral Oral Oral Oral  SpO2:  100%    Weight:    72.2 kg  Height:    5\' 1"  (1.549 m)   Physical Exam General: Well developed, alert, in NAD Heart: RRR, no murmurs, rubs or gallops Lungs: CTA bilaterally without wheezing, rhonchi or rales Abdomen: Soft, non-distended, +BS Extremities: No edema b/l lower extremities Dialysis Access: RUE AVF + bruit  Additional Objective Labs: Basic Metabolic Panel: Recent Labs  Lab 06/30/20 0230 06/30/20 0230 06/30/20 0237 06/30/20 1106 07/01/20 0316  NA 135   < > 133* 136 141  K 3.4*   < > 3.3* 3.9 3.8  CL 89*   < > 91* 91* 102  CO2 30  --   --  31 27  GLUCOSE 416*   < > 405* 433* 122*  BUN 47*   < > 45* 56* 35*  CREATININE 10.06*   < > 10.20* 11.16* 6.92*  CALCIUM 9.9  --   --  9.3 9.1   < > = values in this interval not displayed.   Liver Function Tests: Recent Labs  Lab 06/30/20 0230 06/30/20 1106 07/01/20 0316  AST 17 16 19   ALT 12 12 15   ALKPHOS 105 117 91  BILITOT 0.4 0.6 0.4  PROT 7.2 7.1 6.6  ALBUMIN 3.3* 3.2* 2.9*   CBC: Recent Labs  Lab 06/30/20 0230 06/30/20 0237 06/30/20 1841  WBC 6.7  --  6.7  NEUTROABS 4.7  --   --   HGB 9.1* 10.2* 9.7*  HCT 32.1* 30.0* 29.6*  MCV 101.6*  --  88.6  PLT 163  --  151   Blood Culture    Component Value Date/Time   SDES BLOOD LEFT ANTECUBITAL 02/23/2019 0712   SPECREQUEST  02/23/2019 8546    AEROBIC BOTTLE ONLY Blood Culture results may not be optimal due to an inadequate volume of blood received in culture  bottles   CULT  02/23/2019 0712    NO GROWTH 5 DAYS Performed at [facility], [place] [address] [place], [place] [address]    REPTSTATUS 02/28/2019 FINAL 02/23/2019 0712    CBG: Recent Labs  Lab 07/01/20 1611 07/01/20 1757 07/01/20 2135 07/02/20 0621 07/02/20 0818  GLUCAP 67* 184* 222* 94 130*    Studies/Results: DG CHEST PORT 1 VIEW  Result Date: 06/30/2020 CLINICAL DATA:  Volume overload EXAM: PORTABLE CHEST 1 VIEW COMPARISON:  02/05/2020 FINDINGS: The heart size and mediastinal contours are within normal limits. Both lungs are clear. The visualized skeletal structures are unremarkable. IMPRESSION: Normal study. Electronically Signed   By: Rolm Baptise M.D.   On: 06/30/2020 18:33   Medications:  . Chlorhexidine Gluconate Cloth  6 each Topical Q0600  . cinacalcet  90 mg Oral Q supper  . heparin  5,000 Units Subcutaneous Q8H  . insulin aspart  0-9 Units Subcutaneous TID WC  . insulin detemir  8 Units Subcutaneous QHS  . losartan  50 mg Oral Daily  Dialysis Orders: [facility] 3h 45 min EDW 68 kg 2K/2Ca UFP 2 Hep 4000 R AVF Hectorol 8 TIW Venofer 50 q wk Mircera 75 q 4 wks (last 11/9)  Assessment/Plan: 1. AMS/weakness. Code stroke on admission. No acute CVA on MRI.  More likely acute encephalopathy Not likely uremic. Has not missed any HD. ?HTN encephalopathy.Started on losartan. MS improved today. 2. ESRD -HD TTS. Due for HD today-should get HD today prior to discharge. Next dialysis will be Monday due to holiday scheduled.  3. Hypertension/volume - Significant HTN with SBPs >200 on admission. Improved. BP usually well controlled. Does not appear to be on any antiHTN meds at home but started on losartan here with good effect.  4. Anemia - Hgb 9.7, continue ESA as outpatient 5. Metabolic bone disease -Corrected calcium high but improving. ContinueRenvela binder, Sensipar. Hold hectorol for now [place]  6. DM - insulin per primary   Anice Paganini,  PA-C 07/02/2020, 11:41 AM  [facility] Pager: [phone]

## 2020-07-02 NOTE — Plan of Care (Signed)
  Problem: Education: Goal: Knowledge of secondary prevention will improve Outcome: Progressing   Problem: Education: Goal: Knowledge of patient specific risk factors addressed and post discharge goals established will improve Outcome: Progressing   Problem: Education: Goal: Individualized Educational Video(s) Outcome: Progressing   Problem: Education: Goal: Knowledge of General Education information will improve Description: Including pain rating scale, medication(s)/side effects and non-pharmacologic comfort measures Outcome: Progressing   Problem: Education: Goal: Knowledge of patient specific risk factors addressed and post discharge goals established will improve Outcome: Progressing   Problem: Education: Goal: Knowledge of disease or condition will improve Outcome: Progressing

## 2020-07-02 NOTE — Progress Notes (Signed)
Patient wants to go home without dialysis and says already made appointment in [facility] for dialysis tomorrow morning 07/03/20, Oncall provider is notified, pt and her spouse signed AMA form.

## 2020-07-02 NOTE — Evaluation (Signed)
Speech Language Pathology Evaluation Patient Details Name: [name] MRN: [id number] DOB: [birth year] Today's Date: 07/02/2020 Time: 6295-2841 SLP Time Calculation (min) (ACUTE ONLY): 28 min  Problem List:  Patient Active Problem List   Diagnosis Date Noted  . Encephalopathy acute 07/01/2020  . Acute encephalopathy 06/30/2020  . Hypertensive urgency 06/30/2020  . Blindness of right eye 06/30/2020  . Type 2 diabetes mellitus with hyperglycemia, with long-term current use of insulin ([place]) 05/13/2020  . 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 Fever   . Hyperglycemia due to type 2 diabetes mellitus ([place]) 02/21/2019  . Suspected COVID-19 virus infection 02/21/2019  . ESRD on hemodialysis ([place]) 02/21/2019  . Anemia of chronic disease 02/11/2019  . Diabetes mellitus ([place]) 02/11/2019  . HLD (hyperlipidemia) 02/11/2019  . PTSD (post-traumatic stress disorder) 02/11/2019  . PVD (peripheral vascular disease) ([place]) 02/11/2019  . Retinopathy 02/11/2019  . Secondary hyperparathyroidism of renal origin ([place]) 02/11/2019  . Cataracts, bilateral 01/05/2015  . Hyperphosphatemia 01/05/2015  . Status post amputation of toe of right foot ([place]) 01/05/2015   Past Medical History:  Past Medical History:  Diagnosis Date  . Anemia   . Anxiety   . Blind right eye 2008  . Diabetes mellitus without complication ([place])   . Dialysis patient ([place])   . ESRD (end stage renal disease) [facility])    Dialysis T/Th/Sa   Past Surgical History:  Past Surgical History:  Procedure Laterality Date  . AMPUTATION TOE Left   . AV FISTULA PLACEMENT    . CESAREAN SECTION  2011  . FISTULA SUPERFICIALIZATION Right 32/44/0102   Procedure: 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;  Surgeon: Angelia Mould, MD;  Location: [facility];  Service: Vascular;  Laterality: Right;  . INSERTION OF DIALYSIS CATHETER Left 07/27/2019    Procedure: INSERTION OF TUNNELED  DIALYSIS CATHETER;  Surgeon: Angelia Mould, MD;  Location: [facility] OR;  Service: Vascular;  Laterality: Left;   HPI:  39 year old female with past medical history of end-stage renal disease, hypertension, right eye blindness, diabetes mellitus type 2 who presents to [facility] emergency department via EMS as a code stroke. MRI brain on 07/01/20 indicated No acute finding. 2. 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  Assessment / Plan / Recommendation Clinical Impression  Pt administered the SLUMS ([place] Mental Status Examination) with a score obtained of  20/24 with clock formation/visual tasks eliminated d/t pt's visual deficits impacting performance in these areas.  She is able to comprehend a simple paragraph with 75% accuracy, recall 4/5 words with time constraint and process information and execute multi-step directives, answer questions, etc. with an overall delay in processing of information.  Speech intelligibility affected primarily by fluency of speech as she was noted to exhibit blocking and part/whole word repetitions during speech interactions.  She also exhibits expressive aphasia/anomia during interactions with awareness noted by pt and perseveration present within communicative interactions.  ST will f/u for above defiicts while in acute setting.  OP SLP and/or HH SLP recommended at d/c.  Thank you for this consult.    SLP Assessment  SLP Recommendation/Assessment: Patient needs continued Speech Language Pathology Services SLP Visit Diagnosis: Aphasia (R47.01);Cognitive communication deficit (R41.841);Other (comment) (Fluency)    Follow Up Recommendations  Home health SLP;Outpatient SLP    Frequency and Duration min 2x/week  1 week      SLP Evaluation Cognition  Overall Cognitive Status: Impaired/Different  from baseline Arousal/Alertness: Awake/alert Orientation Level: Oriented  X4 Attention: Sustained Sustained Attention: Impaired Sustained Attention Impairment: Verbal basic;Functional basic Memory: Impaired Memory Impairment: Retrieval deficit;Decreased recall of new information Immediate Memory Recall: Sock;Blue;Bed Memory Recall Sock: Without Cue Memory Recall Blue: Without Cue Memory Recall Bed: Not able to recall Awareness: Appears intact Problem Solving: Appears intact Behaviors: Other (comment) (decreased overall processing/execution of information) Safety/Judgment: Appears intact       Comprehension  Auditory Comprehension Overall Auditory Comprehension: Impaired Yes/No Questions: Within Functional Limits Commands: Impaired Multistep Basic Commands: 50-74% accurate Conversation: Simple Interfering Components: Attention;Processing speed;Working Field seismologist: Conservation officer, nature: Not tested Reading Comprehension Reading Status: Unable to assess (comment) (visual deficits)    Expression Expression Primary Mode of Expression: Verbal Verbal Expression Overall Verbal Expression: Impaired Initiation: No impairment Level of Generative/Spontaneous Verbalization: Sentence;Conversation Repetition: Impaired Level of Impairment: Phrase level Naming: Impairment Responsive: 51-75% accurate Confrontation: Impaired Convergent: 25-49% accurate Divergent: 25-49% accurate Verbal Errors: Perseveration;Aware of errors Pragmatics: No impairment Interfering Components: Attention;Speech intelligibility Non-Verbal Means of Communication: Not applicable Written Expression Dominant Hand: Left Written Expression: Unable to assess (comment) (visual deficits)   Oral / Motor  Oral Motor/Sensory Function Overall Oral Motor/Sensory Function: Within functional limits Motor Speech Respiration: Within functional limits Phonation: Normal Resonance: Within functional limits Articulation:  Within functional limitis Intelligibility: Intelligibility reduced (primarily d/t fluency) Motor Planning: Not tested Motor Speech Errors: Not applicable                      Elvina Sidle, M.S., CCC-SLP 07/02/2020, 2:46 PM

## 2020-07-02 NOTE — Progress Notes (Signed)
Occupational Therapy Treatment Patient Details Name: [name] MRN: [id number] DOB: 02-Sep-1980 Today's Date: 07/02/2020    History of present illness Pt is a 39 y/o female admitted secondary to acute encephalopathy. PMH includes ESRD on HD, DM, HTN, and R eye blindness.    OT comments  Pt received asleep in the bed. Pt agreeable to OT session this date. She currently demonstrates improvement in speech, but continues to demonstrate word finding difficulties. Pt demonstrates improvement with functional mobility at RW level requiring minguard for navigating her way to the bathroom. Pt required minguard for grooming at sink level secondary to visual limitations. Pt will continue to benefit from skilled OT services to maximize safety and independence with ADL/IADL and functional mobility. Will continue to follow acutely and progress as tolerated.    Follow Up Recommendations  Home health OT;Supervision/Assistance - 24 hour    Equipment Recommendations  3 in 1 bedside commode    Recommendations for Other Services      Precautions / Restrictions Precautions Precautions: Fall Restrictions Weight Bearing Restrictions: No       Mobility Bed Mobility Overal bed mobility: Modified Independent                Transfers Overall transfer level: Needs assistance Equipment used: Rolling walker (2 wheeled);1 person hand held assist Transfers: Sit to/from [facility] Sit to Stand: Supervision Stand pivot transfers: Min guard       General transfer comment: minguard to guide due to vision deficits    Balance Overall balance assessment: Needs assistance Sitting-balance support: No upper extremity supported;Feet supported Sitting balance-Leahy Scale: Fair     Standing balance support: No upper extremity supported;During functional activity Standing balance-Leahy Scale: Fair Standing balance comment: able to stand at sink level for hand washing without UE support  and without LOB                           ADL either performed or assessed with clinical judgement   ADL Overall ADL's : Needs assistance/impaired     Grooming: Standing;Min guard Grooming Details (indicate cue type and reason): cues to locate towels and soap due to visual limitations             Lower Body Dressing: Min guard;Sit to/from stand Lower Body Dressing Details (indicate cue type and reason): minguard for safety Toilet Transfer: Ambulation;Min guard Toilet Transfer Details (indicate cue type and reason): minguard for directions to bathroom secondary to visual limitations Toileting- Clothing Manipulation and Hygiene: Sit to/from stand;Min guard Toileting - Clothing Manipulation Details (indicate cue type and reason): for safety; pt had BM     Functional mobility during ADLs: Min guard;Rolling walker General ADL Comments: mingaurd for safety,      Vision   Vision Assessment?: Vision impaired- to be further tested in functional context Additional Comments: blind right eye, pt requires cues to navigate unfamiliar environment, requires cues to locate items    Perception     Praxis      Cognition Arousal/Alertness: Awake/alert Behavior During Therapy: Flat affect Overall Cognitive Status: Impaired/Different from baseline Area of Impairment: Following commands;Safety/judgement;Awareness;Problem solving;Attention                   Current Attention Level: Alternating   Following Commands: Follows one step commands with increased time Safety/Judgement: Decreased awareness of safety;Decreased awareness of deficits Awareness: Emergent Problem Solving: Slow processing;Requires verbal cues;Difficulty sequencing General Comments: pt with improvement in cognition, oriented  x4, pt continues to demonstrate decrease safety awareness, pt more conversant this date, did not formally assess at this time        Exercises     Shoulder Instructions        General Comments vss, no family present during session    Pertinent Vitals/ Pain       Pain Assessment: 0-10 Pain Score: 4  Pain Location: aching in right knee down to foot, pt reports pain feels achy but gets better after walking Pain Descriptors / Indicators: Aching Pain Intervention(s): Limited activity within patient's tolerance;Monitored during session  Home Living                                          Prior Functioning/Environment              Frequency  Min 2X/week        Progress Toward Goals  OT Goals(current goals can now be found in the care plan section)  Progress towards OT goals: Progressing toward goals  Acute Rehab OT Goals Patient Stated Goal: to improve mobility OT Goal Formulation: With patient Time For Goal Achievement: 07/15/20 Potential to Achieve Goals: Good ADL Goals Pt Will Perform Grooming: with modified independence;standing Pt Will Perform Lower Body Dressing: with modified independence;sit to/from stand Additional ADL Goal #1: Pt will complete 3 step multistep cognition task with <3 errors for safe completion of ADL/IADL. Additional ADL Goal #2: Pt will demonstrate anticipatory awareness for safe completion of ADL/IADL in home environment.  Plan      Co-evaluation                 AM-PAC OT "6 Clicks" Daily Activity     Outcome Measure   Help from another person eating meals?: A Little Help from another person taking care of personal grooming?: A Little Help from another person toileting, which includes using toliet, bedpan, or urinal?: A Little Help from another person bathing (including washing, rinsing, drying)?: A Little Help from another person to put on and taking off regular upper body clothing?: A Little Help from another person to put on and taking off regular lower body clothing?: A Little 6 Click Score: 18    End of Session Equipment Utilized During Treatment: Gait belt  OT Visit Diagnosis:  Unsteadiness on feet (R26.81);Other abnormalities of gait and mobility (R26.89);Muscle weakness (generalized) (M62.81);Other symptoms and signs involving cognitive function   Activity Tolerance Patient tolerated treatment well   Patient Left in bed;with bed alarm set;with call bell/phone within reach   Nurse Communication Mobility status (expressive difficulties)        Time: 3545-6256 OT Time Calculation (min): 24 min  Charges: OT General Charges $OT Visit: 1 Visit OT Treatments $Self Care/Home Management : 23-37 mins  Helene Kelp OTR/L Acute Rehabilitation Services Office: [place] 07/02/2020, 12:50 PM

## 2020-07-03 ENCOUNTER — Telehealth: Payer: Self-pay | Admitting: Physician Assistant

## 2020-07-03 NOTE — Telephone Encounter (Signed)
Transition of care contact from inpatient facility  Date of Discharge: 07/02/20 Date of Contact:07/03/20 Method of contact: Phone  Attempted to contact patient to discuss transition of care from inpatient admission. Patient did not answer the phone. Message was left on the patient's voicemail with call back instructions.  Anice Paganini, PA-C 07/03/2020, 12:38 PM  [facility]

## 2020-08-18 NOTE — Progress Notes (Deleted)
Name: [name]  Age/ Sex: 40 y.o., female   MRN/ DOB: [id number], 1980/09/29     PCP: [facility]   Reason for Endocrinology Evaluation: Type 2 Diabetes Mellitus  Initial Endocrine Consultative Visit: 05/13/2020    PATIENT IDENTIFIER: [name] is a 40 y.o. female with a past medical history of T2DM, ESRD on HD ( 2016)  and PVD . The patient has followed with Endocrinology clinic since 05/13/2020 for consultative assistance with management of her diabetes.  DIABETIC HISTORY:  [name] was diagnosed with DM at age 89,has been on insulin for years. Her hemoglobin A1c has ranged from 11.2% in 2021, peaking at 11.3% in 2020.  On her initial visit to our clinic she had an A1c 11.2% SUBJECTIVE:   During the last visit (05/13/2020): A1c 11.2% adjusted MDI regimen provided a correction scale    Today (08/19/2020): [name] is here for a follow up on diabetes follow up.  She checks her blood sugars *** times daily, preprandial to breakfast and ***. The patient has *** had hypoglycemic episodes since the last clinic visit, which typically occur *** x / - most often occuring ***. The patient is *** symptomatic with these episodes, with symptoms of {symptoms; hypoglycemia:[id number]}.       HOME DIABETES REGIMEN:  lantus 20 units daily  Novolog 5 units with each meal  CF: NovoLog (BG -130/45)    Statin: no ACE-I/ARB: on HD    METER DOWNLOAD SUMMARY: Date range evaluated: *** Fingerstick Blood Glucose Tests = *** Average Number Tests/Day = *** Overall Mean FS Glucose = *** Standard Deviation = ***  BG Ranges: Low = *** High = ***   Hypoglycemic Events/30 Days: BG < 50 = *** Episodes of symptomatic severe hypoglycemia = ***    DIABETIC COMPLICATIONS: Microvascular complications:   Retinopathy ( blind right eye), ESRD on HD  Denies: neuropathy  Last Eye Exam: Completed 09/28/2019  Macrovascular complications:    Denies: CAD,  CVA, PVD   HISTORY:  Past Medical History:  Past Medical History:  Diagnosis Date  . Anemia   . Anxiety   . Blind right eye 2008  . Diabetes mellitus without complication ([place])   . Dialysis patient ([place])   . ESRD (end stage renal disease) [facility])    Dialysis T/Th/Sa   Past Surgical History:  Past Surgical History:  Procedure Laterality Date  . AMPUTATION TOE Left   . AV FISTULA PLACEMENT    . CESAREAN SECTION  2011  . FISTULA SUPERFICIALIZATION Right 09/73/5329   Procedure: 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;  Surgeon: Angelia Mould, MD;  Location: [place];  Service: Vascular;  Laterality: Right;  . INSERTION OF DIALYSIS CATHETER Left 07/27/2019   Procedure: INSERTION OF TUNNELED  DIALYSIS CATHETER;  Surgeon: Angelia Mould, MD;  Location: [place];  Service: Vascular;  Laterality: Left;    Social History:  reports that she has never smoked. She has never used smokeless tobacco. She reports that she does not drink alcohol and does not use drugs. Family History:  Family History  Problem Relation Age of Onset  . Diabetes Mother   . Diabetes Father      HOME MEDICATIONS: Allergies as of 08/19/2020      Reactions   Morphine Rash, Other (See Comments)   Peanut-containing Drug Products Anaphylaxis, Hives   Penicillins Anaphylaxis   Chocolate Hives      Medication List  Accurate as of August 19, 2020  7:09 AM. If you have any questions, ask your nurse or doctor.        Accu-Chek FastClix Lancets Misc Use as instructed to check blood sugar up to TID. E11.22   Accu-Chek Guide Me w/Device Kit 1 kit by Does not apply route 3 (three) times daily. Use to check BG at home up to 3 times daily. E11.22   Accu-Chek Guide test strip Generic drug: glucose blood Use as instructed to check blood sugar up to TID. E11.22   ARANESP (ALBUMIN FREE) IJ Darbepoetin Alfa (Aranesp)   calcitRIOL 0.25 MCG capsule Commonly known as: ROCALTROL Take  0.25 mcg by mouth 2 (two) times daily.   Fosrenol 1000 MG Pack Generic drug: Lanthanum Carbonate Take 2,000 mg by mouth See admin instructions. Take 2000 mg ( 2 packs) by mouth with meals three times daily and 1 pack (1028m) twice daily with a snack   HAIR/SKIN/NAILS PO Take 1 capsule by mouth daily.   Insulin Pen Needle 32G X 4 MM Misc 1 Device by Does not apply route as directed.   Lantus SoloStar 100 UNIT/ML Solostar Pen Generic drug: insulin glargine Inject 20 Units into the skin daily.   lidocaine-prilocaine cream Commonly known as: EMLA Apply 1 application topically as directed.   losartan 50 MG tablet Commonly known as: COZAAR Take 1 tablet (50 mg total) by mouth daily.   multivitamin Tabs tablet Take 1 tablet by mouth daily.   NovoLOG FlexPen 100 UNIT/ML FlexPen Generic drug: insulin aspart Inject 5 Units into the skin 3 (three) times daily with meals. Max daily 40 units with correction scale   Sensipar 90 MG tablet Generic drug: cinacalcet Take 90 mg by mouth daily.   sevelamer carbonate 0.8 g Pack packet Commonly known as: RENVELA Take 0.8 g by mouth 3 (three) times daily with meals.        OBJECTIVE:   Vital Signs: There were no vitals taken for this visit.  Wt Readings from Last 3 Encounters:  07/02/20 159 lb 2.8 oz (72.2 kg)  05/13/20 152 lb (68.9 kg)  02/05/20 140 lb (63.5 kg)     Exam: General: Pt appears well and is in NAD  Lungs: Clear with good BS bilat with no rales, rhonchi, or wheezes  Heart: RRR   Abdomen: Normoactive bowel sounds, soft, nontender  Extremities: No pretibial edema.   Neuro: MS is good with appropriate affect, pt is alert and Ox3    DM foot exam: Please see diabetic assessment flow-sheet detailed below:           DATA REVIEWED:  Lab Results  Component Value Date   HGBA1C 9.9 (H) 06/30/2020   HGBA1C 11.4 (H) 05/18/2020   HGBA1C 11.2 (A) 05/13/2020   Lab Results  Component Value Date   LDLCALC 97  06/30/2020   CREATININE 6.92 (H) 07/01/2020   Lab Results  Component Value Date   MICRALBCREAT 958 (H) 07/17/2019     Lab Results  Component Value Date   CHOL 173 06/30/2020   HDL 60 06/30/2020   LDLCALC 97 06/30/2020   TRIG 82 06/30/2020   CHOLHDL 2.9 06/30/2020         ASSESSMENT / PLAN / RECOMMENDATIONS:   1) Type 2 Diabetes Mellitus, ***controlled, With retinopathic complications  And ESRD on HD- Most recent A1c of *** %. Goal A1c < 7.0 %.    Plan: MEDICATIONS:  ***  EDUCATION / INSTRUCTIONS:  BG monitoring instructions: Patient is  instructed to check her blood sugars *** times a day, ***.  Call [facility] if: BG persistently < 70  . I reviewed the Rule of 15 for the treatment of hypoglycemia in detail with the patient. Literature supplied.     2) Diabetic complications:   Eye: Does have known diabetic retinopathy.   Neuro/ Feet: Does not have known diabetic peripheral neuropathy.  Renal: Patient does have known baseline ESRD on HD. .     F/U in ***    Signed electronically by: Mack Guise, MD  [facility]  [facility] [address] [place], [place] [address] Phone: [phone] FAX: [phone]   [place], [facility] [place] [address] Phone: [phone]  Fax: [phone]  Return to Endocrinology clinic as below: Future Appointments  Date Time Provider [place]  08/19/2020 10:30 AM Shreeya Recendiz, Melanie Crazier, MD [facility] None

## 2020-08-19 ENCOUNTER — Ambulatory Visit: Payer: Medicaid Other | Admitting: Internal Medicine

## 2020-09-08 ENCOUNTER — Emergency Department (HOSPITAL_COMMUNITY): Payer: Medicaid Other

## 2020-09-08 ENCOUNTER — Emergency Department (HOSPITAL_COMMUNITY)
Admission: EM | Admit: 2020-09-08 | Discharge: 2020-09-08 | Disposition: A | Discharge status: Home / Self Care | Payer: Medicaid Other | Attending: Emergency Medicine | Admitting: Emergency Medicine

## 2020-09-08 ENCOUNTER — Encounter (HOSPITAL_COMMUNITY): Payer: Self-pay

## 2020-09-08 DIAGNOSIS — Z794 Long term (current) use of insulin: Secondary | ICD-10-CM | POA: Diagnosis not present

## 2020-09-08 DIAGNOSIS — S99922A Unspecified injury of left foot, initial encounter: Secondary | ICD-10-CM | POA: Diagnosis present

## 2020-09-08 DIAGNOSIS — Z91010 Allergy to peanuts: Secondary | ICD-10-CM | POA: Diagnosis not present

## 2020-09-08 DIAGNOSIS — W108XXA Fall (on) (from) other stairs and steps, initial encounter: Secondary | ICD-10-CM | POA: Diagnosis not present

## 2020-09-08 DIAGNOSIS — Y92019 Unspecified place in single-family (private) house as the place of occurrence of the external cause: Secondary | ICD-10-CM | POA: Insufficient documentation

## 2020-09-08 DIAGNOSIS — E1165 Type 2 diabetes mellitus with hyperglycemia: Secondary | ICD-10-CM | POA: Insufficient documentation

## 2020-09-08 DIAGNOSIS — E1122 Type 2 diabetes mellitus with diabetic chronic kidney disease: Secondary | ICD-10-CM | POA: Insufficient documentation

## 2020-09-08 DIAGNOSIS — N186 End stage renal disease: Secondary | ICD-10-CM | POA: Insufficient documentation

## 2020-09-08 DIAGNOSIS — S93602A Unspecified sprain of left foot, initial encounter: Secondary | ICD-10-CM | POA: Insufficient documentation

## 2020-09-08 DIAGNOSIS — Z992 Dependence on renal dialysis: Secondary | ICD-10-CM | POA: Diagnosis not present

## 2020-09-08 DIAGNOSIS — R739 Hyperglycemia, unspecified: Secondary | ICD-10-CM

## 2020-09-08 DIAGNOSIS — W010XXA Fall on same level from slipping, tripping and stumbling without subsequent striking against object, initial encounter: Secondary | ICD-10-CM

## 2020-09-08 LAB — CBG MONITORING, ED
Glucose-Capillary: 421 mg/dL — ABNORMAL HIGH (ref 70–99)
Glucose-Capillary: 396 mg/dL — ABNORMAL HIGH (ref 70–99)
Glucose-Capillary: 120 mg/dL — ABNORMAL HIGH (ref 70–99)

## 2020-09-08 LAB — CBC
HCT: 32.3 % — ABNORMAL LOW (ref 36.0–46.0)
Hemoglobin: 10.6 g/dL — ABNORMAL LOW (ref 12.0–15.0)
MCH: 29 pg (ref 26.0–34.0)
MCHC: 32.8 g/dL (ref 30.0–36.0)
MCV: 88.5 fL (ref 80.0–100.0)
Platelets: 186 10*3/uL (ref 150–400)
RBC: 3.65 MIL/uL — ABNORMAL LOW (ref 3.87–5.11)
RDW: 13 % (ref 11.5–15.5)
WBC: 5.5 10*3/uL (ref 4.0–10.5)
nRBC: 0 % (ref 0.0–0.2)

## 2020-09-08 LAB — I-STAT BETA HCG BLOOD, ED (MC, WL, AP ONLY): I-stat hCG, quantitative: <5 m[IU]/mL (ref <5)

## 2020-09-08 LAB — BASIC METABOLIC PANEL
Anion gap: 15 (ref 5–15)
BUN: 45 mg/dL — ABNORMAL HIGH (ref 6–20)
CO2: 27 mmol/L (ref 22–32)
Calcium: 8.2 mg/dL — ABNORMAL LOW (ref 8.9–10.3)
Chloride: 92 mmol/L — ABNORMAL LOW (ref 98–111)
Creatinine, Ser: 9.81 mg/dL — ABNORMAL HIGH (ref 0.44–1.00)
GFR, Estimated: 5 mL/min — ABNORMAL LOW (ref >60)
Glucose, Bld: 460 mg/dL — ABNORMAL HIGH (ref 70–99)
Potassium: 4.3 mmol/L (ref 3.5–5.1)
Sodium: 134 mmol/L — ABNORMAL LOW (ref 135–145)

## 2020-09-08 MED ORDER — INSULIN ASPART 100 UNIT/ML ~~LOC~~ SOLN
10.0000 [IU] | Freq: Once | SUBCUTANEOUS | Status: AC
  Administered 2020-09-08: 10 [IU] via SUBCUTANEOUS

## 2020-09-08 NOTE — ED Notes (Signed)
Patient transported to X-ray 

## 2020-09-08 NOTE — ED Notes (Signed)
Patient verbalizes understanding of discharge instructions. Opportunity for questioning and answers were provided. Armband removed by staff, pt discharged from ED via wheelchair to lobby to await for husband to pick up and go home.

## 2020-09-08 NOTE — Progress Notes (Signed)
Orthopedic Tech Progress Note Patient Details:  [name] [birth year] [id number]  Ortho Devices Type of Ortho Device: Crutches Ortho Device/Splint Interventions: Ordered,Adjustment   Post Interventions Patient Tolerated: Well Instructions Provided: Adjustment of device,Care of device,Poper ambulation with device   Rito Lecomte 09/08/2020, 8:37 AM

## 2020-09-08 NOTE — Discharge Instructions (Addendum)
Apply ice for 30 minutes at a time, 4 times a day.  Take acetaminophen as needed for pain.  Wear the postop shoe as needed.  Use your crutches as needed.  Make sure you take your insulin every day.

## 2020-09-08 NOTE — ED Provider Notes (Signed)
[facility] EMERGENCY DEPARTMENT Provider Note   CSN: [id number] Arrival date & time: 09/08/20  7253   History Chief Complaint  Patient presents with  . Fall  . Hyperglycemia    [name] is a 40 y.o. female.  The history is provided by the patient.  Fall  Hyperglycemia She has history of diabetes, hyperlipidemia, end-stage renal disease on hemodialysis and comes in following a fall at home.  She states that she tripped after going down some steps and injured her left foot.  She rates pain at 8/10.  She has not been able to bear weight.  She denies other injury.  Of note, she states that she did not take her insulin yesterday because she was not feeling well.  She denies any nausea or vomiting.  She denies any head injury.  Past Medical History:  Diagnosis Date  . Anemia   . Anxiety   . Blind right eye 2008  . Diabetes mellitus without complication ([place])   . Dialysis patient ([place])   . ESRD (end stage renal disease) [facility])    Dialysis T/Th/Sa    Patient Active Problem List   Diagnosis Date Noted  . Encephalopathy acute 07/01/2020  . Acute encephalopathy 06/30/2020  . Hypertensive urgency 06/30/2020  . Blindness of right eye 06/30/2020  . Type 2 diabetes mellitus with hyperglycemia, with long-term current use of insulin ([place]) 05/13/2020  . Type 2 diabetes mellitus with proliferative retinopathy, with long-term current use of insulin ([place]) 05/13/2020  . Fever   . Hyperglycemia due to type 2 diabetes mellitus ([place]) 02/21/2019  . Suspected COVID-19 virus infection 02/21/2019  . ESRD on hemodialysis ([place]) 02/21/2019  . Anemia of chronic disease 02/11/2019  . Diabetes mellitus ([place]) 02/11/2019  . HLD (hyperlipidemia) 02/11/2019  . PTSD (post-traumatic stress disorder) 02/11/2019  . PVD (peripheral vascular disease) ([place]) 02/11/2019  . Retinopathy 02/11/2019  . Secondary hyperparathyroidism of renal origin ([place]) 02/11/2019  . Cataracts, bilateral  01/05/2015  . Hyperphosphatemia 01/05/2015  . Status post amputation of toe of right foot ([place]) 01/05/2015    Past Surgical History:  Procedure Laterality Date  . AMPUTATION TOE Left   . AV FISTULA PLACEMENT    . CESAREAN SECTION  2011  . FISTULA SUPERFICIALIZATION Right 66/44/0347   Procedure: 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;  Surgeon: Angelia Mould, MD;  Location: [place];  Service: Vascular;  Laterality: Right;  . INSERTION OF DIALYSIS CATHETER Left 07/27/2019   Procedure: INSERTION OF TUNNELED  DIALYSIS CATHETER;  Surgeon: Angelia Mould, MD;  Location: [facility] OR;  Service: Vascular;  Laterality: Left;     OB History   No obstetric history on file.     Family History  Problem Relation Age of Onset  . Diabetes Mother   . Diabetes Father     Social History   Tobacco Use  . Smoking status: Never Smoker  . Smokeless tobacco: Never Used  Vaping Use  . Vaping Use: Never used  Substance Use Topics  . Alcohol use: No  . Drug use: No    Home Medications Prior to Admission medications   Medication Sig Start Date End Date Taking? Authorizing Provider  Accu-Chek FastClix Lancets MISC Use as instructed to check blood sugar up to TID. E11.22 04/15/19   Charlott Rakes, MD  Biotin w/ Vitamins C & E (HAIR/SKIN/NAILS PO) Take 1 capsule by mouth daily.    [provider]  Blood Glucose Monitoring Suppl ([place])  w/Device KIT 1 kit by Does not apply route 3 (three) times daily. Use to check BG at home up to 3 times daily. E11.22 04/15/19   Charlott Rakes, MD  calcitRIOL (ROCALTROL) 0.25 MCG capsule Take 0.25 mcg by mouth 2 (two) times daily.     [provider]  cinacalcet (SENSIPAR) 90 MG tablet Take 90 mg by mouth daily.  09/22/19   [provider]  Darbepoetin Alfa (ARANESP, ALBUMIN FREE, IJ) Darbepoetin Alfa (Aranesp) 11/17/19 11/15/20  [provider]  FOSRENOL 1000 MG PACK Take 2,000 mg by mouth See admin  instructions. Take 2000 mg ( 2 packs) by mouth with meals three times daily and 1 pack (1044m) twice daily with a snack 12/25/18   [provider]  glucose blood (ACCU-CHEK GUIDE) test strip Use as instructed to check blood sugar up to TID. E11.22 07/17/19   Fulp, Cammie, MD  insulin aspart (NOVOLOG FLEXPEN) 100 UNIT/ML FlexPen Inject 5 Units into the skin 3 (three) times daily with meals. Max daily 40 units with correction scale 05/13/20   Shamleffer, IMelanie Crazier MD  insulin glargine (LANTUS SOLOSTAR) 100 UNIT/ML Solostar Pen Inject 20 Units into the skin daily. 05/13/20   Shamleffer, IMelanie Crazier MD  Insulin Pen Needle 32G X 4 MM MISC 1 Device by Does not apply route as directed. 05/13/20   Shamleffer, IMelanie Crazier MD  lidocaine-prilocaine (EMLA) cream Apply 1 application topically as directed.  09/07/19   [provider]  losartan (COZAAR) 50 MG tablet Take 1 tablet (50 mg total) by mouth daily. 07/02/20   Pokhrel, LCorrie Mckusick MD  multivitamin (RENA-VIT) TABS tablet Take 1 tablet by mouth daily. 07/18/19   Fulp, Cammie, MD  sevelamer carbonate (RENVELA) 0.8 g PACK packet Take 0.8 g by mouth 3 (three) times daily with meals. Patient not taking: Reported on 06/30/2020    [provider]    Allergies    Morphine, Peanut-containing drug products, Penicillins, and Chocolate  Review of Systems   Review of Systems  All other systems reviewed and are negative.   Physical Exam Updated Vital Signs BP (!) 157/63   Pulse 87   Temp 98.2 F (36.8 C)   Resp 18   SpO2 100%   Physical Exam Vitals and nursing note reviewed.   40year old female, resting comfortably and in no acute distress. Vital signs are significant for elevated blood pressure. Oxygen saturation is 100%, which is normal. Head is normocephalic and atraumatic. PERRLA, EOMI. Oropharynx is clear. Neck is nontender and supple without adenopathy or JVD. Back is nontender and there is no CVA  tenderness. Lungs are clear without rales, wheezes, or rhonchi. Chest is nontender. Heart has regular rate and rhythm without murmur. Abdomen is soft, flat, nontender without masses or hepatosplenomegaly and peristalsis is normoactive. Extremities: No swelling or deformity is noted but there is moderate tenderness to palpation over the lateral aspect of the right midfoot.  No ankle tenderness or swelling.  Full range of motion of all joints without pain. Skin is warm and dry without rash. Neurologic: Mental status is normal, cranial nerves are intact, there are no motor or sensory deficits.  ED Results / Procedures / Treatments   Labs (all labs ordered are listed, but only abnormal results are displayed) Labs Reviewed  BASIC METABOLIC PANEL - Abnormal; Notable for the following components:      Result Value   Sodium 134 (*)    Chloride 92 (*)    Glucose, Bld 460 (*)  BUN 45 (*)    Creatinine, Ser 9.81 (*)    Calcium 8.2 (*)    GFR, Estimated 5 (*)    All other components within normal limits  CBC - Abnormal; Notable for the following components:   RBC 3.65 (*)    Hemoglobin 10.6 (*)    HCT 32.3 (*)    All other components within normal limits  CBG MONITORING, ED - Abnormal; Notable for the following components:   Glucose-Capillary 421 (*)    All other components within normal limits  CBG MONITORING, ED - Abnormal; Notable for the following components:   Glucose-Capillary 396 (*)    All other components within normal limits  URINALYSIS, ROUTINE W REFLEX MICROSCOPIC  I-STAT BETA HCG BLOOD, ED ([facility], [facility], [facility] ONLY)   Radiology DG Ankle Complete Left  Result Date: 09/08/2020 CLINICAL DATA:  Left ankle pain after a fall several hours ago. EXAM: LEFT ANKLE COMPLETE - 3+ VIEW COMPARISON:  None. FINDINGS: No evidence of acute fracture or dislocation of the left ankle. Bone cortex appears intact. Joint space and ankle mortise are preserved. Incidental note of previous resection or  resorption of the distal second metatarsal bone. Extensive vascular calcifications. IMPRESSION: No acute bony abnormalities. Extensive vascular calcifications. Electronically Signed   By: Lucienne Capers M.D.   On: 09/08/2020 03:57   DG Foot Complete Left  Result Date: 09/08/2020 CLINICAL DATA:  Fall, left foot pain EXAM: LEFT FOOT - COMPLETE 3+ VIEW COMPARISON:  None. FINDINGS: Three view radiograph left foot demonstrates remote transmetatarsal amputation of the second digit. Moderate first ray bunion deformity. No acute fracture or dislocation. Advanced vascular calcifications are seen within the left foot. IMPRESSION: No acute fracture or dislocation. Electronically Signed   By: Fidela Salisbury MD   On: 09/08/2020 06:50    Procedures .Ortho Injury Treatment  Date/Time: 09/08/2020 7:12 AM Performed by: Delora Fuel, MD Authorized by: Delora Fuel, MD   Consent:    Consent obtained:  Verbal   Consent given by:  Patient   Alternatives discussed:  No treatmentInjury location: foot Location details: left foot Injury type: soft tissue Pre-procedure neurovascular assessment: neurovascularly intact Pre-procedure distal perfusion: normal Pre-procedure neurological function: normal Pre-procedure range of motion: normal  Anesthesia: Local anesthesia used: no  Patient sedated: NoImmobilization: post op shoe. Splint Applied by: Sheliah Hatch Supplies used: post op shoe. Post-procedure neurovascular assessment: post-procedure neurovascularly intact Post-procedure distal perfusion: normal Post-procedure neurological function: normal Post-procedure range of motion: normal      Medications Ordered in ED Medications  insulin aspart (novoLOG) injection 10 Units (has no administration in time range)    ED Course  I have reviewed the triage vital signs and the nursing notes.  Pertinent labs & imaging results that were available during my care of the patient were reviewed by me and considered  in my medical decision making (see chart for details).  MDM Rules/Calculators/A&P Fall with left foot injury.  Ankle x-ray had been ordered at triage and showed no fracture.  Does not completely visualize the area where she is having tenderness, so she is sent for an x-ray of the foot.  Labs show elevated glucose of 468 with mild hyponatremia which is appropriate for level of hyperglycemia.  Chronic anemia is present and actually improved over baseline.  She is given a dose of insulin.  Foot x-rays are negative for fracture.  She is given a postop shoe and crutches, advised to apply ice and use over-the-counter analgesics as needed.  Referred  to orthopedics for follow-up.  Final Clinical Impression(s) / ED Diagnoses Final diagnoses:  Fall from slip, trip, or stumble, initial encounter  Sprain of left foot, initial encounter  Hyperglycemia    Rx / DC Orders ED Discharge Orders    None       Delora Fuel, MD 59/30/12 [phone]

## 2020-09-08 NOTE — ED Triage Notes (Signed)
Pt comes via [facility] for a fall that happened several hours ago, now having L ankle pain, pt ambulatory on scene. Pt CBG was In the 500 with EMS, type 2 diabetic, pt is a dialysis pt T,Thu, Sat

## 2020-09-09 ENCOUNTER — Ambulatory Visit: Payer: Medicaid Other | Admitting: Internal Medicine

## 2020-09-09 NOTE — Progress Notes (Deleted)
Name: [name]  Age/ Sex: 40 y.o., female   MRN/ DOB: [id number], [birth year]     PCP: [facility], [facility]   Reason for Endocrinology Evaluation: Type 2 Diabetes Mellitus  Initial Endocrine Consultative Visit: 05/13/2020    PATIENT IDENTIFIER: [name] is a 40 y.o. female with a past medical history of T2DM, ESRD on HD ( 2016)  and PVD . The patient has followed with Endocrinology clinic since 05/13/2020 for consultative assistance with management of her diabetes.  DIABETIC HISTORY:  [name] was diagnosed with DM at age 64,has been on insulin for years. Her hemoglobin A1c has ranged from 11.2% in 2021, peaking at 11.3% in 2020.  On her initial visit to our clinic she had an A1c 11.2% SUBJECTIVE:   During the last visit (05/13/2020): A1c 11.2% adjusted MDI regimen provided a correction scale    Today (09/09/2020): [name] is here for a follow up on diabetes follow up.  She checks her blood sugars *** times daily, preprandial to breakfast and ***. The patient has *** had hypoglycemic episodes since the last clinic visit, which typically occur *** x / - most often occuring ***. The patient is *** symptomatic with these episodes, with symptoms of {symptoms; hypoglycemia:9084048}.       HOME DIABETES REGIMEN:  lantus 20 units daily  Novolog 5 units with each meal  CF: NovoLog (BG -130/45)    Statin: no ACE-I/ARB: on HD    METER DOWNLOAD SUMMARY: Date range evaluated: *** Fingerstick Blood Glucose Tests = *** Average Number Tests/Day = *** Overall Mean FS Glucose = *** Standard Deviation = ***  BG Ranges: Low = *** High = ***   Hypoglycemic Events/30 Days: BG < 50 = *** Episodes of symptomatic severe hypoglycemia = ***    DIABETIC COMPLICATIONS: Microvascular complications:   Retinopathy ( blind right eye), ESRD on HD  Denies: neuropathy  Last Eye Exam: Completed 09/28/2019  Macrovascular complications:    Denies: CAD,  CVA, PVD   HISTORY:  Past Medical History:  Past Medical History:  Diagnosis Date  . Anemia   . Anxiety   . Blind right eye 2008  . Diabetes mellitus without complication ([place])   . Dialysis patient ([place])   . ESRD (end stage renal disease) [facility])    Dialysis T/Th/Sa   Past Surgical History:  Past Surgical History:  Procedure Laterality Date  . AMPUTATION TOE Left   . AV FISTULA PLACEMENT    . CESAREAN SECTION  2011  . 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 INSERTION OF DIALYSIS CATHETER Left 07/27/2019   Procedure: INSERTION OF TUNNELED  DIALYSIS CATHETER;  Surgeon: Angelia Mould, MD;  Location: [place];  Service: Vascular;  Laterality: Left;    Social History:  reports that she has never smoked. She has never used smokeless tobacco. She reports that she does not drink alcohol and does not use drugs. Family History:  Family History  Problem Relation Age of Onset  . Diabetes Mother   . Diabetes Father      HOME MEDICATIONS: Allergies as of 09/09/2020      Reactions   Morphine Rash, Other (See Comments)   Peanut-containing Drug Products Anaphylaxis, Hives   Penicillins Anaphylaxis   Chocolate Hives      Medication List  Accurate as of September 09, 2020  7:33 AM. If you have any questions, ask your nurse or doctor.        Accu-Chek FastClix Lancets Misc Use as instructed to check blood sugar up to TID. E11.22   Accu-Chek Guide Me w/Device Kit 1 kit by Does not apply route 3 (three) times daily. Use to check BG at home up to 3 times daily. E11.22   Accu-Chek Guide test strip Generic drug: glucose blood Use as instructed to check blood sugar up to TID. E11.22   ARANESP (ALBUMIN FREE) IJ Darbepoetin Alfa (Aranesp)   calcitRIOL 0.25 MCG capsule Commonly known as: ROCALTROL Take  0.25 mcg by mouth 2 (two) times daily.   Fosrenol 1000 MG Pack Generic drug: Lanthanum Carbonate Take 2,000 mg by mouth See admin instructions. Take 2000 mg ( 2 packs) by mouth with meals three times daily and 1 pack (1074m) twice daily with a snack   HAIR/SKIN/NAILS PO Take 1 capsule by mouth daily.   Insulin Pen Needle 32G X 4 MM Misc 1 Device by Does not apply route as directed.   Lantus SoloStar 100 UNIT/ML Solostar Pen Generic drug: insulin glargine Inject 20 Units into the skin daily.   lidocaine-prilocaine cream Commonly known as: EMLA Apply 1 application topically as directed.   losartan 50 MG tablet Commonly known as: COZAAR Take 1 tablet (50 mg total) by mouth daily.   multivitamin Tabs tablet Take 1 tablet by mouth daily.   NovoLOG FlexPen 100 UNIT/ML FlexPen Generic drug: insulin aspart Inject 5 Units into the skin 3 (three) times daily with meals. Max daily 40 units with correction scale   Sensipar 90 MG tablet Generic drug: cinacalcet Take 90 mg by mouth daily.   sevelamer carbonate 0.8 g Pack packet Commonly known as: RENVELA Take 0.8 g by mouth 3 (three) times daily with meals.        OBJECTIVE:   Vital Signs: There were no vitals taken for this visit.  Wt Readings from Last 3 Encounters:  07/02/20 159 lb 2.8 oz (72.2 kg)  05/13/20 152 lb (68.9 kg)  02/05/20 140 lb (63.5 kg)     Exam: General: Pt appears well and is in NAD  Lungs: Clear with good BS bilat with no rales, rhonchi, or wheezes  Heart: RRR   Abdomen: Normoactive bowel sounds, soft, nontender  Extremities: No pretibial edema.   Neuro: MS is good with appropriate affect, pt is alert and Ox3    DM foot exam: Please see diabetic assessment flow-sheet detailed below:           DATA REVIEWED:  Lab Results  Component Value Date   HGBA1C 9.9 (H) 06/30/2020   HGBA1C 11.4 (H) 05/18/2020   HGBA1C 11.2 (A) 05/13/2020   Lab Results  Component Value Date   LDLCALC 97  06/30/2020   CREATININE 9.81 (H) 09/08/2020   Lab Results  Component Value Date   MICRALBCREAT 958 (H) 07/17/2019     Lab Results  Component Value Date   CHOL 173 06/30/2020   HDL 60 06/30/2020   LDLCALC 97 06/30/2020   TRIG 82 06/30/2020   CHOLHDL 2.9 06/30/2020         ASSESSMENT / PLAN / RECOMMENDATIONS:   1) Type 2 Diabetes Mellitus, ***controlled, With retinopathic complications  And ESRD on HD- Most recent A1c of *** %. Goal A1c < 7.0 %.    Plan: MEDICATIONS:  ***  EDUCATION / INSTRUCTIONS:  BG monitoring instructions: Patient is  instructed to check her blood sugars *** times a day, ***.  Call [place] [facility] clinic if: BG persistently < 70  . I reviewed the Rule of 15 for the treatment of hypoglycemia in detail with the patient. Literature supplied.     2) Diabetic complications:   Eye: Does have known diabetic retinopathy.   Neuro/ Feet: Does not have known diabetic peripheral neuropathy.  Renal: Patient does have known baseline ESRD on HD. .     F/U in ***    Signed electronically by: Mack Guise, MD  [facility]  [facility] [address] [place], [place] [address] Phone: [phone] FAX: [phone]   [place], [facility] [place] [address] Phone: [phone]  Fax: [phone]  Return to Endocrinology clinic as below: Future Appointments  Date Time Provider [place]  09/09/2020  8:10 AM Valeri Sula, Melanie Crazier, MD [facility] None

## 2020-09-12 ENCOUNTER — Encounter: Payer: Self-pay | Admitting: Internal Medicine

## 2020-09-12 ENCOUNTER — Ambulatory Visit (INDEPENDENT_AMBULATORY_CARE_PROVIDER_SITE_OTHER): Payer: Medicaid Other | Admitting: Internal Medicine

## 2020-09-12 ENCOUNTER — Other Ambulatory Visit: Payer: Self-pay

## 2020-09-12 VITALS — BP 150/92 | HR 88 | Ht 61.0 in | Wt 162.1 lb

## 2020-09-12 DIAGNOSIS — Z992 Dependence on renal dialysis: Secondary | ICD-10-CM

## 2020-09-12 DIAGNOSIS — E1165 Type 2 diabetes mellitus with hyperglycemia: Secondary | ICD-10-CM

## 2020-09-12 DIAGNOSIS — N186 End stage renal disease: Secondary | ICD-10-CM | POA: Diagnosis not present

## 2020-09-12 DIAGNOSIS — E1122 Type 2 diabetes mellitus with diabetic chronic kidney disease: Secondary | ICD-10-CM | POA: Diagnosis not present

## 2020-09-12 DIAGNOSIS — E113599 Type 2 diabetes mellitus with proliferative diabetic retinopathy without macular edema, unspecified eye: Secondary | ICD-10-CM

## 2020-09-12 DIAGNOSIS — Z794 Long term (current) use of insulin: Secondary | ICD-10-CM

## 2020-09-12 LAB — POCT GLYCOSYLATED HEMOGLOBIN (HGB A1C): Hemoglobin A1C: 10.3 % — AB (ref 4.0–5.6)

## 2020-09-12 LAB — POCT GLUCOSE (DEVICE FOR HOME USE): POC Glucose: 173 mg/dl — AB (ref 70–99)

## 2020-09-12 MED ORDER — DEXCOM G6 SENSOR MISC: 1.0000 | 3 refills | Status: DC

## 2020-09-12 MED ORDER — DEXCOM G6 TRANSMITTER MISC: 1.0000 | 3 refills | Status: DC

## 2020-09-12 MED ORDER — DEXCOM G6 RECEIVER DEVI: 1.0000 | 0 refills | Status: DC

## 2020-09-12 NOTE — Patient Instructions (Addendum)
-   Restarted Lantus 20 units daily  - Continue Novolog 5 units with each meal  -Novolog correctional insulin: ADD extra units on insulin to your meal-time Novolog dose if your blood sugars are higher than 175 . Use the scale below to help guide you:   Blood sugar before meal Number of units to inject  Less than 175 0 unit  176 - 220 1 units  221 -  265 2 units  266 -  310 3 units  311 -  355 4 units  356 -  400 5 units  401 -  445 6 units  446 -  490 7 units  491 -  535 8 units          HOW TO TREAT LOW BLOOD SUGARS (Blood sugar LESS THAN 70 MG/DL)  Please follow the RULE OF 15 for the treatment of hypoglycemia treatment (when your (blood sugars are less than 70 mg/dL)    STEP 1: Take 15 grams of carbohydrates when your blood sugar is low, which includes:   3-4 GLUCOSE TABS  OR  3-4 OZ OF JUICE OR REGULAR SODA OR  ONE TUBE OF GLUCOSE GEL     STEP 2: RECHECK blood sugar in 15 MINUTES STEP 3: If your blood sugar is still low at the 15 minute recheck --> then, go back to STEP 1 and treat AGAIN with another 15 grams of carbohydrates.

## 2020-09-12 NOTE — Progress Notes (Signed)
   Name: [name]  Age/ Sex: 39 y.o., female   MRN/ DOB: [id number], 01/03/1981     PCP: [facility]   Reason for Endocrinology Evaluation: Type 2 Diabetes Mellitus  Initial Endocrine Consultative Visit: 05/13/2020    PATIENT IDENTIFIER: [name] is a 39 y.o. female with a past medical history of T2DM, ESRD on HD ( 2016)  and PVD . The patient has followed with Endocrinology clinic since 05/13/2020 for consultative assistance with management of her diabetes.  DIABETIC HISTORY:  [name] was diagnosed with DM at age 13,has been on insulin for years. Her hemoglobin A1c has ranged from 11.2% in 2021, peaking at 11.3% in 2020.  On her initial visit to our clinic she had an A1c 11.2% SUBJECTIVE:   During the last visit (05/13/2020): A1c 11.2% adjusted MDI regimen provided a correction scale    Today (09/13/2020): [name] is here for a follow up on diabetes follow up. She is accompanied by her spouse today.  She checks her blood sugars 0 times daily.  The patient is unknown to have  hypoglycemic episodes since the last clinic visit.   Ran out of basal insulin for a while even though she has refills for up to a year which is until  05/2021      HOME DIABETES REGIMEN:  lantus 20 units daily - not taking  Novolog 5 units with each meal  CF: NovoLog (BG -130/45)    Statin: no ACE-I/ARB: on HD    METER DOWNLOAD SUMMARY: No readings    DIABETIC COMPLICATIONS: Microvascular complications:   Retinopathy ( blind right eye), ESRD on HD  Denies: neuropathy  Last Eye Exam: Completed 09/28/2019  Macrovascular complications:    Denies: CAD, CVA, PVD   HISTORY:  Past Medical History:  Past Medical History:  Diagnosis Date  . Anemia   . Anxiety   . Blind right eye 2008  . Diabetes mellitus without complication ([facility])   . Dialysis patient ([facility])   . ESRD (end stage renal disease) ([facility])    Dialysis T/Th/Sa   Past Surgical History:   Past Surgical History:  Procedure Laterality Date  . AMPUTATION TOE Left   . AV FISTULA PLACEMENT    . CESAREAN SECTION  2011  . 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 INSERTION OF DIALYSIS CATHETER Left 07/27/2019   Procedure: INSERTION OF TUNNELED  DIALYSIS CATHETER;  Surgeon: Dickson, Christopher S, MD;  Location: [facility];  Service: Vascular;  Laterality: Left;    Social History:  reports that she has never smoked. She has never used smokeless tobacco. She reports that she does not drink alcohol and does not use drugs. Family History:  Family History  Problem Relation Age of Onset  . Diabetes Mother   . Diabetes Father      HOME MEDICATIONS: Allergies as of 09/12/2020      Reactions   Morphine Rash, Other (See Comments)   Peanut-containing Drug Products Anaphylaxis, Hives   Penicillins Anaphylaxis   Chocolate Hives      Medication List       Accurate as of September 12, 2020 11:59 PM. If you have any questions, ask your nurse or doctor.        Accu-Chek FastClix Lancets Misc Use as instructed to check blood sugar up   to TID. E11.22   Accu-Chek Guide Me w/Device Kit 1 kit by Does not apply route 3 (three) times daily. Use to check BG at home up to 3 times daily. E11.22   Accu-Chek Guide test strip Generic drug: glucose blood Use as instructed to check blood sugar up to TID. E11.22   ARANESP (ALBUMIN FREE) IJ Darbepoetin Alfa (Aranesp)   calcitRIOL 0.25 MCG capsule Commonly known as: ROCALTROL Take 0.25 mcg by mouth 2 (two) times daily.   cinacalcet 90 MG tablet Commonly known as: SENSIPAR Take 90 mg by mouth daily.   Dexcom G6 Receiver Devi 1 Device by Does not apply route as directed. Started by:  J , MD   Dexcom G6 Sensor Misc 1 Device by Does not apply route as  directed. Started by:  J , MD   Dexcom G6 Transmitter Misc 1 Device by Does not apply route as directed. Started by:  J , MD   Fosrenol 1000 MG Pack Generic drug: Lanthanum Carbonate Take 2,000 mg by mouth See admin instructions. Take 2000 mg ( 2 packs) by mouth with meals three times daily and 1 pack (1000mg) twice daily with a snack   HAIR/SKIN/NAILS PO Take 1 capsule by mouth daily.   Insulin Pen Needle 32G X 4 MM Misc 1 Device by Does not apply route as directed.   Lantus SoloStar 100 UNIT/ML Solostar Pen Generic drug: insulin glargine Inject 20 Units into the skin daily.   lidocaine-prilocaine cream Commonly known as: EMLA Apply 1 application topically as directed.   losartan 50 MG tablet Commonly known as: COZAAR Take 1 tablet (50 mg total) by mouth daily.   multivitamin Tabs tablet Take 1 tablet by mouth daily.   NovoLOG FlexPen 100 UNIT/ML FlexPen Generic drug: insulin aspart Inject 5 Units into the skin 3 (three) times daily with meals. Max daily 40 units with correction scale   sevelamer carbonate 0.8 g Pack packet Commonly known as: RENVELA Take 0.8 g by mouth 3 (three) times daily with meals.        OBJECTIVE:   Vital Signs: BP (!) 150/92   Pulse 88   Ht 5' 1" (1.549 m)   Wt 162 lb 2 oz (73.5 kg)   SpO2 98%   BMI 30.63 kg/m   Wt Readings from Last 3 Encounters:  09/12/20 162 lb 2 oz (73.5 kg)  07/02/20 159 lb 2.8 oz (72.2 kg)  05/13/20 152 lb (68.9 kg)     Exam: General: Pt appears well and is in NAD  Lungs: Clear with good BS bilat with no rales, rhonchi, or wheezes  Heart: RRR   Extremities: No pretibial edema.   Neuro: MS is good with appropriate affect, pt is alert and Ox3    DATA REVIEWED:  Lab Results  Component Value Date   HGBA1C 10.3 (A) 09/12/2020   HGBA1C 9.9 (H) 06/30/2020   HGBA1C 11.4 (H) 05/18/2020   Lab Results  Component Value Date   LDLCALC 97 06/30/2020   CREATININE 9.81 (H)  09/08/2020   Lab Results  Component Value Date   MICRALBCREAT 958 (H) 07/17/2019     Lab Results  Component Value Date   CHOL 173 06/30/2020   HDL 60 06/30/2020   LDLCALC 97 06/30/2020   TRIG 82 06/30/2020   CHOLHDL 2.9 06/30/2020         ASSESSMENT / PLAN / RECOMMENDATIONS:   1) Type 2 Diabetes Mellitus, Poorly controlled, With retinopathic complications and ESRD on HD- Most recent   A1c of 10.3 %. Goal A1c < 7.0 %.    - Pt with low health literacy as well as medication non-adherence  - She has ran out of lantus, she was unable to tell me when was the last time she used it, she had a year refill until 05/2021 - We again emphasized the importance of glycemic control in protecting her organs and slowing down progression of any current damage  - Will prescribe Dexcom , spouse will help with this     MEDICATIONS: - Restarted Lantus 20 units daily  - Continue Novolog 5 units with each meal  - CF: Novolog ( BG - 130/45)    EDUCATION / INSTRUCTIONS:  BG monitoring instructions: Patient is instructed to check her blood sugars 3 times a day, before meals.  Call [place] [facility] clinic if: BG persistently < 70  . I reviewed the Rule of 15 for the treatment of hypoglycemia in detail with the patient. Literature supplied.     2) Diabetic complications:   Eye: Does have known diabetic retinopathy.   Neuro/ Feet: Does not have known diabetic peripheral neuropathy.  Renal: Patient does have known baseline ESRD on HD. .     F/U in 3 months    Signed electronically by: Abby Jaralla , MD  [place] [facility]  [facility] [address] [place], [place] [address] Phone: [phone] FAX: [phone]   CC: Inc, [facility] [address] [place] [address] Phone: [phone]  Fax: [phone]  Return to Endocrinology clinic as below: Future Appointments  Date Time Provider Department  Center  12/09/2020 10:30 AM ,  Jaralla, MD [facility] None      

## 2020-09-13 MED ORDER — LANTUS SOLOSTAR 100 UNIT/ML ~~LOC~~ SOPN
20.0000 [IU] | PEN_INJECTOR | Freq: Every day | SUBCUTANEOUS | 11 refills | Status: DC
Start: 2020-09-13 — End: 2021-08-24

## 2020-09-13 MED ORDER — NOVOLOG FLEXPEN 100 UNIT/ML ~~LOC~~ SOPN
5.0000 [IU] | PEN_INJECTOR | Freq: Three times a day (TID) | SUBCUTANEOUS | 11 refills | Status: DC
Start: 2020-09-13 — End: 2021-08-25

## 2020-10-12 ENCOUNTER — Encounter (HOSPITAL_COMMUNITY): Payer: Self-pay

## 2020-10-12 ENCOUNTER — Emergency Department (HOSPITAL_COMMUNITY)
Admission: EM | Admit: 2020-10-12 | Discharge: 2020-10-13 | Disposition: A | Discharge status: Home / Self Care | Payer: Medicaid Other | Attending: Emergency Medicine | Admitting: Emergency Medicine

## 2020-10-12 DIAGNOSIS — F1290 Cannabis use, unspecified, uncomplicated: Secondary | ICD-10-CM | POA: Insufficient documentation

## 2020-10-12 DIAGNOSIS — Z992 Dependence on renal dialysis: Secondary | ICD-10-CM | POA: Diagnosis not present

## 2020-10-12 DIAGNOSIS — Z5321 Procedure and treatment not carried out due to patient leaving prior to being seen by health care provider: Secondary | ICD-10-CM | POA: Insufficient documentation

## 2020-10-12 DIAGNOSIS — R42 Dizziness and giddiness: Secondary | ICD-10-CM | POA: Insufficient documentation

## 2020-10-12 LAB — COMPREHENSIVE METABOLIC PANEL
ALT: 16 U/L (ref 0–44)
AST: 19 U/L (ref 15–41)
Albumin: 3.3 g/dL — ABNORMAL LOW (ref 3.5–5.0)
Alkaline Phosphatase: 87 U/L (ref 38–126)
Anion gap: 14 (ref 5–15)
BUN: 26 mg/dL — ABNORMAL HIGH (ref 6–20)
CO2: 27 mmol/L (ref 22–32)
Calcium: 9.5 mg/dL (ref 8.9–10.3)
Chloride: 94 mmol/L — ABNORMAL LOW (ref 98–111)
Creatinine, Ser: 8.71 mg/dL — ABNORMAL HIGH (ref 0.44–1.00)
GFR, Estimated: 6 mL/min — ABNORMAL LOW (ref >60)
Glucose, Bld: 379 mg/dL — ABNORMAL HIGH (ref 70–99)
Potassium: 6 mmol/L — ABNORMAL HIGH (ref 3.5–5.1)
Sodium: 135 mmol/L (ref 135–145)
Total Bilirubin: 0.7 mg/dL (ref 0.3–1.2)
Total Protein: 7.8 g/dL (ref 6.5–8.1)

## 2020-10-12 LAB — I-STAT BETA HCG BLOOD, ED (MC, WL, AP ONLY): I-stat hCG, quantitative: <5 m[IU]/mL (ref <5)

## 2020-10-12 LAB — CBC
HCT: 35.5 % — ABNORMAL LOW (ref 36.0–46.0)
Hemoglobin: 11.2 g/dL — ABNORMAL LOW (ref 12.0–15.0)
MCH: 28.1 pg (ref 26.0–34.0)
MCHC: 31.5 g/dL (ref 30.0–36.0)
MCV: 89.2 fL (ref 80.0–100.0)
Platelets: 176 10*3/uL (ref 150–400)
RBC: 3.98 MIL/uL (ref 3.87–5.11)
RDW: 13.4 % (ref 11.5–15.5)
WBC: 5.4 10*3/uL (ref 4.0–10.5)
nRBC: 0 % (ref 0.0–0.2)

## 2020-10-12 LAB — ETHANOL: Alcohol, Ethyl (B): <10 mg/dL (ref <10)

## 2020-10-12 NOTE — ED Triage Notes (Signed)
Pt comes via [facility] for eating mariajuana gummies and smoking some mariajuana and now feels dizzy. Pt is a dialysis pt, last received yesterday

## 2020-10-12 NOTE — ED Notes (Signed)
Husband with pt states she feels faint and is [place] have a stroke like last time.

## 2020-10-12 NOTE — ED Notes (Signed)
Pt husband says she feels better so they are going home.

## 2020-12-09 ENCOUNTER — Ambulatory Visit: Payer: Medicaid Other | Admitting: Internal Medicine

## 2020-12-19 ENCOUNTER — Encounter: Payer: Self-pay | Admitting: Internal Medicine

## 2020-12-19 ENCOUNTER — Ambulatory Visit: Payer: Medicaid Other | Admitting: Internal Medicine

## 2020-12-19 NOTE — Progress Notes (Deleted)
Name: [name]  Age/ Sex: 40 y.o., female   MRN/ DOB: [id number], April 17, 1981     PCP: [facility]   Reason for Endocrinology Evaluation: Type 2 Diabetes Mellitus  Initial Endocrine Consultative Visit: 05/13/2020    PATIENT IDENTIFIER: [name] is a 40 y.o. female with a past medical history of T2DM, ESRD on HD ( 2016)  and PVD . The patient has followed with Endocrinology clinic since 05/13/2020 for consultative assistance with management of her diabetes.  DIABETIC HISTORY:  [name] was diagnosed with DM at age 79,has been on insulin for years. Her hemoglobin A1c has ranged from 11.2% in 2021, peaking at 11.3% in 2020.  On her initial visit to our clinic she had an A1c 11.2% SUBJECTIVE:   During the last visit (09/12/2020): A1c 10.3 % restarted lantus, continued Novolog and CF    Today (12/19/2020): [name] is here for a follow up on diabetes follow up. She is accompanied by her spouse today.  She checks her blood sugars 0 times daily.  The patient is unknown to have  hypoglycemic episodes since the last clinic visit.    HOME DIABETES REGIMEN:  lantus 20 units daily - not taking  Novolog 5 units with each meal  CF: NovoLog (BG -130/45)    Statin: no ACE-I/ARB: on HD    METER DOWNLOAD SUMMARY: No readings    DIABETIC COMPLICATIONS: Microvascular complications:   Retinopathy ( blind right eye), ESRD on HD  Denies: neuropathy  Last Eye Exam: Completed 09/28/2019  Macrovascular complications:    Denies: CAD, CVA, PVD   HISTORY:  Past Medical History:  Past Medical History:  Diagnosis Date  . Anemia   . Anxiety   . Blind right eye 2008  . Diabetes mellitus without complication ([place])   . Dialysis patient ([place])   . ESRD (end stage renal disease) [facility])    Dialysis T/Th/Sa   Past Surgical History:  Past Surgical History:  Procedure Laterality Date  . AMPUTATION TOE Left   . AV FISTULA PLACEMENT    . CESAREAN  SECTION  2011  . 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 INSERTION OF DIALYSIS CATHETER Left 07/27/2019   Procedure: INSERTION OF TUNNELED  DIALYSIS CATHETER;  Surgeon: Angelia Mould, MD;  Location: [facility];  Service: Vascular;  Laterality: Left;    Social History:  reports that she has never smoked. She has never used smokeless tobacco. She reports that she does not drink alcohol and does not use drugs. Family History:  Family History  Problem Relation Age of Onset  . Diabetes Mother   . Diabetes Father      HOME MEDICATIONS: Allergies as of 12/19/2020      Reactions   Morphine Rash, Other (See Comments)   Peanut-containing Drug Products Anaphylaxis, Hives   Penicillins Anaphylaxis   Chocolate Hives      Medication List       Accurate as of Dec 19, 2020  7:19 AM. If you have any questions, ask your nurse or doctor.        Accu-Chek FastClix Lancets Misc Use as instructed to check blood sugar up to TID. E11.22   Accu-Chek Guide Me w/Device Kit 1 kit by Does not apply route 3 (three) times daily. Use to check BG at  home up to 3 times daily. E11.22   Accu-Chek Guide test strip Generic drug: glucose blood Use as instructed to check blood sugar up to TID. E11.22   calcitRIOL 0.25 MCG capsule Commonly known as: ROCALTROL Take 0.25 mcg by mouth 2 (two) times daily.   cinacalcet 90 MG tablet Commonly known as: SENSIPAR Take 90 mg by mouth daily.   Dexcom G6 Receiver Devi 1 Device by Does not apply route as directed.   Dexcom G6 Sensor Misc 1 Device by Does not apply route as directed.   Dexcom G6 Transmitter Misc 1 Device by Does not apply route as directed.   Fosrenol 1000 MG Pack Generic drug: Lanthanum Carbonate Take 2,000 mg by mouth See admin instructions. Take 2000 mg ( 2 packs) by  mouth with meals three times daily and 1 pack (1087m) twice daily with a snack   HAIR/SKIN/NAILS PO Take 1 capsule by mouth daily.   Insulin Pen Needle 32G X 4 MM Misc 1 Device by Does not apply route as directed.   Lantus SoloStar 100 UNIT/ML Solostar Pen Generic drug: insulin glargine Inject 20 Units into the skin daily.   lidocaine-prilocaine cream Commonly known as: EMLA Apply 1 application topically as directed.   losartan 50 MG tablet Commonly known as: COZAAR Take 1 tablet (50 mg total) by mouth daily.   multivitamin Tabs tablet Take 1 tablet by mouth daily.   NovoLOG FlexPen 100 UNIT/ML FlexPen Generic drug: insulin aspart Inject 5 Units into the skin 3 (three) times daily with meals. Max daily 40 units with correction scale   sevelamer carbonate 0.8 g Pack packet Commonly known as: RENVELA Take 0.8 g by mouth 3 (three) times daily with meals.        OBJECTIVE:   Vital Signs: There were no vitals taken for this visit.  Wt Readings from Last 3 Encounters:  09/12/20 162 lb 2 oz (73.5 kg)  07/02/20 159 lb 2.8 oz (72.2 kg)  05/13/20 152 lb (68.9 kg)     Exam: General: Pt appears well and is in NAD  Lungs: Clear with good BS bilat with no rales, rhonchi, or wheezes  Heart: RRR   Extremities: No pretibial edema.   Neuro: MS is good with appropriate affect, pt is alert and Ox3    DATA REVIEWED:  Lab Results  Component Value Date   HGBA1C 10.3 (A) 09/12/2020   HGBA1C 9.9 (H) 06/30/2020   HGBA1C 11.4 (H) 05/18/2020   Lab Results  Component Value Date   LDLCALC 97 06/30/2020   CREATININE 8.71 (H) 10/12/2020   Lab Results  Component Value Date   MICRALBCREAT 958 (H) 07/17/2019     Lab Results  Component Value Date   CHOL 173 06/30/2020   HDL 60 06/30/2020   LDLCALC 97 06/30/2020   TRIG 82 06/30/2020   CHOLHDL 2.9 06/30/2020         ASSESSMENT / PLAN / RECOMMENDATIONS:   1) Type 2 Diabetes Mellitus, Poorly controlled, With retinopathic  complications and ESRD on HD- Most recent A1c of 10.3 %. Goal A1c < 7.0 %.    - Pt with low health literacy as well as medication non-adherence  - She has ran out of lantus, she was unable to tell me when was the last time she used it, she had a year refill until 05/2021 - We again emphasized the importance of glycemic control in protecting her organs and slowing down progression of any current damage  - Will prescribe Dexcom ,  spouse will help with this     MEDICATIONS: - Restarted Lantus 20 units daily  - Continue Novolog 5 units with each meal  - CF: Novolog ( BG - 130/45)    EDUCATION / INSTRUCTIONS:  BG monitoring instructions: Patient is instructed to check her blood sugars 3 times a day, before meals.  Call [place] [facility] clinic if: BG persistently < 70  . I reviewed the Rule of 15 for the treatment of hypoglycemia in detail with the patient. Literature supplied.     2) Diabetic complications:   Eye: Does have known diabetic retinopathy.   Neuro/ Feet: Does not have known diabetic peripheral neuropathy.  Renal: Patient does have known baseline ESRD on HD. .     F/U in 3 months    Signed electronically by: Mack Guise, MD  [facility]  [facility] [address] [place], [place] [address] Phone: [phone] FAX: [phone]   [place], [facility] [place] [address] Phone: [phone]  Fax: [phone]  Return to Endocrinology clinic as below: Future Appointments  Date Time Provider [place]  12/19/2020  8:50 AM Ova Meegan, Melanie Crazier, MD [facility] None

## 2021-01-05 NOTE — Progress Notes (Deleted)
Name: [name]  Age/ Sex: 40 y.o., female   MRN/ DOB: [id number], Apr 29, 1981     PCP: [facility]   Reason for Endocrinology Evaluation: Type 2 Diabetes Mellitus  Initial Endocrine Consultative Visit: 05/13/2020    PATIENT IDENTIFIER: [name] is a 40 y.o. female with a past medical history of T2DM, ESRD on HD ( 2016)  and PVD . The patient has followed with Endocrinology clinic since 05/13/2020 for consultative assistance with management of her diabetes.  DIABETIC HISTORY:  [name] was diagnosed with DM at age 19,has been on insulin for years. Her hemoglobin A1c has ranged from 11.2% in 2021, peaking at 11.3% in 2020.  On her initial visit to our clinic she had an A1c 11.2% SUBJECTIVE:   During the last visit (09/12/2020): A1c 10.3 % restarted lantus, continued Novolog and CF    Today (01/05/2021): [name] is here for a follow up on diabetes follow up. She is accompanied by her spouse today.  She checks her blood sugars 0 times daily.  The patient is unknown to have  hypoglycemic episodes since the last clinic visit.    HOME DIABETES REGIMEN:  lantus 20 units daily - not taking  Novolog 5 units with each meal  CF: NovoLog (BG -130/45)    Statin: no ACE-I/ARB: on HD    METER DOWNLOAD SUMMARY: No readings    DIABETIC COMPLICATIONS: Microvascular complications:   Retinopathy ( blind right eye), ESRD on HD  Denies: neuropathy  Last Eye Exam: Completed 09/28/2019  Macrovascular complications:    Denies: CAD, CVA, PVD   HISTORY:  Past Medical History:  Past Medical History:  Diagnosis Date  . Anemia   . Anxiety   . Blind right eye 2008  . Diabetes mellitus without complication ([place])   . Dialysis patient ([place])   . ESRD (end stage renal disease) [facility])    Dialysis T/Th/Sa   Past Surgical History:  Past Surgical History:  Procedure Laterality Date  . AMPUTATION TOE Left   . AV FISTULA PLACEMENT    . CESAREAN  SECTION  2011  . 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 INSERTION OF DIALYSIS CATHETER Left 07/27/2019   Procedure: INSERTION OF TUNNELED  DIALYSIS CATHETER;  Surgeon: Angelia Mould, MD;  Location: [place];  Service: Vascular;  Laterality: Left;    Social History:  reports that she has never smoked. She has never used smokeless tobacco. She reports that she does not drink alcohol and does not use drugs. Family History:  Family History  Problem Relation Age of Onset  . Diabetes Mother   . Diabetes Father      HOME MEDICATIONS: Allergies as of 01/06/2021      Reactions   Morphine Rash, Other (See Comments)   Peanut-containing Drug Products Anaphylaxis, Hives   Penicillins Anaphylaxis   Chocolate Hives      Medication List       Accurate as of Jan 05, 2021  8:36 PM. If you have any questions, ask your nurse or doctor.        Accu-Chek FastClix Lancets Misc Use as instructed to check blood sugar up to TID. E11.22   Accu-Chek Guide Me w/Device Kit 1 kit by Does not apply route 3 (three) times daily. Use to check BG at  home up to 3 times daily. E11.22   Accu-Chek Guide test strip Generic drug: glucose blood Use as instructed to check blood sugar up to TID. E11.22   calcitRIOL 0.25 MCG capsule Commonly known as: ROCALTROL Take 0.25 mcg by mouth 2 (two) times daily.   cinacalcet 90 MG tablet Commonly known as: SENSIPAR Take 90 mg by mouth daily.   Dexcom G6 Receiver Devi 1 Device by Does not apply route as directed.   Dexcom G6 Sensor Misc 1 Device by Does not apply route as directed.   Dexcom G6 Transmitter Misc 1 Device by Does not apply route as directed.   Fosrenol 1000 MG Pack Generic drug: Lanthanum Carbonate Take 2,000 mg by mouth See admin instructions. Take 2000 mg ( 2 packs) by  mouth with meals three times daily and 1 pack (107m) twice daily with a snack   HAIR/SKIN/NAILS PO Take 1 capsule by mouth daily.   Insulin Pen Needle 32G X 4 MM Misc 1 Device by Does not apply route as directed.   Lantus SoloStar 100 UNIT/ML Solostar Pen Generic drug: insulin glargine Inject 20 Units into the skin daily.   lidocaine-prilocaine cream Commonly known as: EMLA Apply 1 application topically as directed.   losartan 50 MG tablet Commonly known as: COZAAR Take 1 tablet (50 mg total) by mouth daily.   multivitamin Tabs tablet Take 1 tablet by mouth daily.   NovoLOG FlexPen 100 UNIT/ML FlexPen Generic drug: insulin aspart Inject 5 Units into the skin 3 (three) times daily with meals. Max daily 40 units with correction scale   sevelamer carbonate 0.8 g Pack packet Commonly known as: RENVELA Take 0.8 g by mouth 3 (three) times daily with meals.        OBJECTIVE:   Vital Signs: There were no vitals taken for this visit.  Wt Readings from Last 3 Encounters:  09/12/20 162 lb 2 oz (73.5 kg)  07/02/20 159 lb 2.8 oz (72.2 kg)  05/13/20 152 lb (68.9 kg)     Exam: General: Pt appears well and is in NAD  Lungs: Clear with good BS bilat with no rales, rhonchi, or wheezes  Heart: RRR   Extremities: No pretibial edema.   Neuro: MS is good with appropriate affect, pt is alert and Ox3    DATA REVIEWED:  Lab Results  Component Value Date   HGBA1C 10.3 (A) 09/12/2020   HGBA1C 9.9 (H) 06/30/2020   HGBA1C 11.4 (H) 05/18/2020   Lab Results  Component Value Date   LDLCALC 97 06/30/2020   CREATININE 8.71 (H) 10/12/2020   Lab Results  Component Value Date   MICRALBCREAT 958 (H) 07/17/2019     Lab Results  Component Value Date   CHOL 173 06/30/2020   HDL 60 06/30/2020   LDLCALC 97 06/30/2020   TRIG 82 06/30/2020   CHOLHDL 2.9 06/30/2020         ASSESSMENT / PLAN / RECOMMENDATIONS:   1) Type 2 Diabetes Mellitus, Poorly controlled, With retinopathic  complications and ESRD on HD- Most recent A1c of 10.3 %. Goal A1c < 7.0 %.    - Pt with low health literacy as well as medication non-adherence  - She has ran out of lantus, she was unable to tell me when was the last time she used it, she had a year refill until 05/2021 - We again emphasized the importance of glycemic control in protecting her organs and slowing down progression of any current damage  - Will prescribe Dexcom ,  spouse will help with this     MEDICATIONS: - Restarted Lantus 20 units daily  - Continue Novolog 5 units with each meal  - CF: Novolog ( BG - 130/45)    EDUCATION / INSTRUCTIONS:  BG monitoring instructions: Patient is instructed to check her blood sugars 3 times a day, before meals.  Call [place] [facility] if: BG persistently < 70  . I reviewed the Rule of 15 for the treatment of hypoglycemia in detail with the patient. Literature supplied.     2) Diabetic complications:   Eye: Does have known diabetic retinopathy.   Neuro/ Feet: Does not have known diabetic peripheral neuropathy.  Renal: Patient does have known baseline ESRD on HD. .     F/U in 3 months    Signed electronically by: Mack Guise, MD  [facility] Endocrinology  [facility] [address] [place], [place] [address] Phone: [phone] FAX: [phone]   [place], [facility] [place] [address] Phone: [phone]  Fax: [phone]  Return to Endocrinology clinic as below: Future Appointments  Date Time Provider [place]  01/06/2021  9:50 AM Garrie Woodin, Melanie Crazier, MD [facility] None

## 2021-01-06 ENCOUNTER — Ambulatory Visit: Payer: Medicaid Other | Admitting: Internal Medicine

## 2021-02-17 ENCOUNTER — Ambulatory Visit: Payer: Medicaid Other | Admitting: Internal Medicine

## 2021-02-17 NOTE — Progress Notes (Deleted)
Name: [name]  Age/ Sex: 40 y.o., female   MRN/ DOB: [id number], 09/12/1980     PCP: [facility]   Reason for Endocrinology Evaluation: Type 2 Diabetes Mellitus  Initial Endocrine Consultative Visit: 05/13/2020    PATIENT IDENTIFIER: [name] is a 40 y.o. female with a past medical history of T2DM, ESRD on HD ( 2016)  and PVD . The patient has followed with Endocrinology clinic since 05/13/2020 for consultative assistance with management of her diabetes.  DIABETIC HISTORY:  [name] was diagnosed with DM at age 84,has been on insulin for years. Her hemoglobin A1c has ranged from 11.2% in 2021, peaking at 11.3% in 2020.  On her initial visit to our clinic she had an A1c 11.2% SUBJECTIVE:   During the last visit (09/12/2020): A1c 10.3% Restarted lantus, continued Novolog     Today (02/17/2021): [name] is here for a follow up on diabetes follow up. She is accompanied by her spouse today.  She checks her blood sugars 0 times daily.  The patient is unknown to have  hypoglycemic episodes since the last clinic visit.    HOME DIABETES REGIMEN:  lantus 20 units daily - not taking  Novolog 5 units with each meal  CF: NovoLog (BG -130/45)    Statin: no ACE-I/ARB: on HD    METER DOWNLOAD SUMMARY: No readings    DIABETIC COMPLICATIONS: Microvascular complications:  Retinopathy ( blind right eye), ESRD on HD Denies: neuropathy Last Eye Exam: Completed 09/28/2019  Macrovascular complications:   Denies: CAD, CVA, PVD   HISTORY:  Past Medical History:  Past Medical History:  Diagnosis Date   Anemia    Anxiety    Blind right eye 2008   Diabetes mellitus without complication ([place])    Dialysis patient ([place])    ESRD (end stage renal disease) ([place])    Dialysis T/Th/Sa   Past Surgical History:  Past Surgical History:  Procedure Laterality Date   AMPUTATION TOE Left    AV FISTULA PLACEMENT     CESAREAN SECTION  2011   FISTULA  SUPERFICIALIZATION Right 63/84/5364   Procedure: 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 She has never used smokeless tobacco. She reports that she does not drink alcohol and does not use drugs. Family History:  Family History  Problem Relation Age of Onset   Diabetes Mother    Diabetes Father      HOME MEDICATIONS: Allergies as of 02/17/2021       Reactions   Morphine Rash, Other (See Comments)   Peanut-containing Drug Products Anaphylaxis, Hives   Penicillins Anaphylaxis   Chocolate Hives        Medication List        Accurate as of February 17, 2021  7:09 AM. If you have any questions, ask your nurse or doctor.          Accu-Chek FastClix Lancets Misc Use as instructed to check blood sugar up to TID. E11.22   Accu-Chek Guide Me w/Device Kit 1 kit by Does not apply route 3 (three) times daily. Use to check BG at home  up to 3 times daily. E11.22   Accu-Chek Guide test strip Generic drug: glucose blood Use as instructed to check blood sugar up to TID. E11.22   calcitRIOL 0.25 MCG capsule Commonly known as: ROCALTROL Take 0.25 mcg by mouth 2 (two) times daily.   cinacalcet 90 MG tablet Commonly known as: SENSIPAR Take 90 mg by mouth daily.   Dexcom G6 Receiver Devi 1 Device by Does not apply route as directed.   Dexcom G6 Sensor Misc 1 Device by Does not apply route as directed.   Dexcom G6 Transmitter Misc 1 Device by Does not apply route as directed.   Fosrenol 1000 MG Pack Generic drug: Lanthanum Carbonate Take 2,000 mg by mouth See admin instructions. Take 2000 mg ( 2 packs) by mouth with meals  three times daily and 1 pack (1065m) twice daily with a snack   HAIR/SKIN/NAILS PO Take 1 capsule by mouth daily.   Insulin Pen Needle 32G X 4 MM Misc 1 Device by Does not apply route as directed.   Lantus SoloStar 100 UNIT/ML Solostar Pen Generic drug: insulin glargine Inject 20 Units into the skin daily.   lidocaine-prilocaine cream Commonly known as: EMLA Apply 1 application topically as directed.   losartan 50 MG tablet Commonly known as: COZAAR Take 1 tablet (50 mg total) by mouth daily.   multivitamin Tabs tablet Take 1 tablet by mouth daily.   NovoLOG FlexPen 100 UNIT/ML FlexPen Generic drug: insulin aspart Inject 5 Units into the skin 3 (three) times daily with meals. Max daily 40 units with correction scale   sevelamer carbonate 0.8 g Pack packet Commonly known as: RENVELA Take 0.8 g by mouth 3 (three) times daily with meals.         OBJECTIVE:   Vital Signs: There were no vitals taken for this visit.  Wt Readings from Last 3 Encounters:  09/12/20 162 lb 2 oz (73.5 kg)  07/02/20 159 lb 2.8 oz (72.2 kg)  05/13/20 152 lb (68.9 kg)     Exam: General: Pt appears well and is in NAD  Lungs: Clear with good BS bilat with no rales, rhonchi, or wheezes  Heart: RRR   Extremities: No pretibial edema.   Neuro: MS is good with appropriate affect, pt is alert and Ox3    DATA REVIEWED:  Lab Results  Component Value Date   HGBA1C 10.3 (A) 09/12/2020   HGBA1C 9.9 (H) 06/30/2020   HGBA1C 11.4 (H) 05/18/2020   Lab Results  Component Value Date   LDLCALC 97 06/30/2020   CREATININE 8.71 (H) 10/12/2020   Lab Results  Component Value Date   MICRALBCREAT 958 (H) 07/17/2019     Lab Results  Component Value Date   CHOL 173 06/30/2020   HDL 60 06/30/2020   LDLCALC 97 06/30/2020   TRIG 82 06/30/2020   CHOLHDL 2.9 06/30/2020         ASSESSMENT / PLAN / RECOMMENDATIONS:   1) Type 2 Diabetes Mellitus, Poorly controlled, With retinopathic complications  and ESRD on HD- Most recent A1c of 10.3 %. Goal A1c < 7.0 %.    - Pt with low health literacy as well as medication non-adherence  - She has ran out of lantus, she was unable to tell me when was the last time she used it, she had a year refill until 05/2021 - We again emphasized the importance of glycemic control in protecting her organs and slowing down progression of any current damage  - Will prescribe Dexcom ,  spouse will help with this     MEDICATIONS: - Restarted Lantus 20 units daily  - Continue Novolog 5 units with each meal  - CF: Novolog ( BG - 130/45)    EDUCATION / INSTRUCTIONS: BG monitoring instructions: Patient is instructed to check her blood sugars 3 times a day, before meals. Call [place] [facility] clinic if: BG persistently < 70  I reviewed the Rule of 15 for the treatment of hypoglycemia in detail with the patient. Literature supplied.     2) Diabetic complications:  Eye: Does have known diabetic retinopathy.  Neuro/ Feet: Does not have known diabetic peripheral neuropathy. Renal: Patient does have known baseline ESRD on HD. .        F/U in 3 months    Signed electronically by: Mack Guise, MD  [facility] Endocrinology  [facility] [address] [place], [place] [address] Phone: [phone] FAX: [phone]   [place], [facility] [place] [address] Phone: [phone]  Fax: [phone]  Return to Endocrinology clinic as below: Future Appointments  Date Time Provider [place]  02/17/2021 11:10 AM Korde Jeppsen, Melanie Crazier, MD [facility] None

## 2021-03-10 ENCOUNTER — Ambulatory Visit: Payer: Medicaid Other | Admitting: Internal Medicine

## 2021-04-27 NOTE — Progress Notes (Deleted)
Name: [name]  Age/ Sex: 40 y.o., female   MRN/ DOB: [id number], Jan 10, 1981     PCP: [facility]   Reason for Endocrinology Evaluation: Type 2 Diabetes Mellitus  Initial Endocrine Consultative Visit: 05/13/2020    PATIENT IDENTIFIER: [name] is a 40 y.o. female with a past medical history of T2DM, ESRD on HD ( 2016)  and PVD . The patient has followed with Endocrinology clinic since 05/13/2020 for consultative assistance with management of her diabetes.  DIABETIC HISTORY:  [name] was diagnosed with DM at age 60,has been on insulin for years. Her hemoglobin A1c has ranged from 11.2% in 2021, peaking at 11.3% in 2020.  On her initial visit to our clinic she had an A1c 11.2% SUBJECTIVE:   During the last visit (09/12/2020): A1c 10.3% adjusted MDI regimen provided a correction scale    Today (04/28/2021): [name] is here for a follow up on diabetes follow up. She is accompanied by her spouse today.  She checks her blood sugars 0 times daily.  The patient is unknown to have  hypoglycemic episodes since the last clinic visit.      HOME DIABETES REGIMEN:  lantus 20 units daily  Novolog 5 units with each meal  CF: NovoLog (BG -130/45)    Statin: no ACE-I/ARB: on HD    METER DOWNLOAD SUMMARY: No readings    DIABETIC COMPLICATIONS: Microvascular complications:  Retinopathy ( blind right eye), ESRD on HD Denies: neuropathy Last Eye Exam: Completed 09/28/2019  Macrovascular complications:   Denies: CAD, CVA, PVD   HISTORY:  Past Medical History:  Past Medical History:  Diagnosis Date   Anemia    Anxiety    Blind right eye 2008   Diabetes mellitus without complication ([place])    Dialysis patient ([place])    ESRD (end stage renal disease) ([place])    Dialysis T/Th/Sa   Past Surgical History:  Past Surgical History:  Procedure Laterality Date   AMPUTATION TOE Left    AV FISTULA PLACEMENT     CESAREAN SECTION  2011    FISTULA SUPERFICIALIZATION Right 97/35/3299   Procedure: 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 She has never used smokeless tobacco. She reports that she does not drink alcohol and does not use drugs. Family History:  Family History  Problem Relation Age of Onset   Diabetes Mother    Diabetes Father      HOME MEDICATIONS: Allergies as of 04/28/2021       Reactions   Morphine Rash, Other (See Comments)   Peanut-containing Drug Products Anaphylaxis, Hives   Penicillins Anaphylaxis   Chocolate Hives        Medication List        Accurate as of April 28, 2021  7:43 AM. If you have any questions, ask your nurse or doctor.          Accu-Chek FastClix Lancets Misc Use as instructed to check blood sugar up to TID. E11.22   Accu-Chek Guide Me w/Device Kit 1 kit by Does not apply route 3 (three) times daily. Use to check BG at  home up to 3 times daily. E11.22   Accu-Chek Guide test strip Generic drug: glucose blood Use as instructed to check blood sugar up to TID. E11.22   calcitRIOL 0.25 MCG capsule Commonly known as: ROCALTROL Take 0.25 mcg by mouth 2 (two) times daily.   cinacalcet 90 MG tablet Commonly known as: SENSIPAR Take 90 mg by mouth daily.   Dexcom G6 Receiver Devi 1 Device by Does not apply route as directed.   Dexcom G6 Sensor Misc 1 Device by Does not apply route as directed.   Dexcom G6 Transmitter Misc 1 Device by Does not apply route as directed.   Fosrenol 1000 MG Pack Generic drug: Lanthanum Carbonate Take 2,000 mg by mouth See admin instructions. Take 2000 mg ( 2 packs) by mouth  with meals three times daily and 1 pack (1058m) twice daily with a snack   HAIR/SKIN/NAILS PO Take 1 capsule by mouth daily.   Insulin Pen Needle 32G X 4 MM Misc 1 Device by Does not apply route as directed.   Lantus SoloStar 100 UNIT/ML Solostar Pen Generic drug: insulin glargine Inject 20 Units into the skin daily.   lidocaine-prilocaine cream Commonly known as: EMLA Apply 1 application topically as directed.   losartan 50 MG tablet Commonly known as: COZAAR Take 1 tablet (50 mg total) by mouth daily.   multivitamin Tabs tablet Take 1 tablet by mouth daily.   NovoLOG FlexPen 100 UNIT/ML FlexPen Generic drug: insulin aspart Inject 5 Units into the skin 3 (three) times daily with meals. Max daily 40 units with correction scale   sevelamer carbonate 0.8 g Pack packet Commonly known as: RENVELA Take 0.8 g by mouth 3 (three) times daily with meals.         OBJECTIVE:   Vital Signs: There were no vitals taken for this visit.  Wt Readings from Last 3 Encounters:  09/12/20 162 lb 2 oz (73.5 kg)  07/02/20 159 lb 2.8 oz (72.2 kg)  05/13/20 152 lb (68.9 kg)     Exam: General: Pt appears well and is in NAD  Lungs: Clear with good BS bilat with no rales, rhonchi, or wheezes  Heart: RRR   Extremities: No pretibial edema.   Neuro: MS is good with appropriate affect, pt is alert and Ox3    DATA REVIEWED:  Lab Results  Component Value Date   HGBA1C 10.3 (A) 09/12/2020   HGBA1C 9.9 (H) 06/30/2020   HGBA1C 11.4 (H) 05/18/2020   Lab Results  Component Value Date   LDLCALC 97 06/30/2020   CREATININE 8.71 (H) 10/12/2020   Lab Results  Component Value Date   MICRALBCREAT 958 (H) 07/17/2019     Lab Results  Component Value Date   CHOL 173 06/30/2020   HDL 60 06/30/2020   LDLCALC 97 06/30/2020   TRIG 82 06/30/2020   CHOLHDL 2.9 06/30/2020         ASSESSMENT / PLAN / RECOMMENDATIONS:   1) Type 2 Diabetes Mellitus, Poorly controlled, With retinopathic  complications and ESRD on HD- Most recent A1c of 10.3 %. Goal A1c < 7.0 %.    - Pt with low health literacy as well as medication non-adherence  - She has ran out of lantus, she was unable to tell me when was the last time she used it, she had a year refill until 05/2021 - We again emphasized the importance of glycemic control in protecting her organs and slowing down progression of any current damage  - Will prescribe  Dexcom , spouse will help with this     MEDICATIONS: - Restarted Lantus 20 units daily  - Continue Novolog 5 units with each meal  - CF: Novolog ( BG - 130/45)    EDUCATION / INSTRUCTIONS: BG monitoring instructions: Patient is instructed to check her blood sugars 3 times a day, before meals. Call [place] [facility] clinic if: BG persistently < 70  I reviewed the Rule of 15 for the treatment of hypoglycemia in detail with the patient. Literature supplied.     2) Diabetic complications:  Eye: Does have known diabetic retinopathy.  Neuro/ Feet: Does not have known diabetic peripheral neuropathy. Renal: Patient does have known baseline ESRD on HD. .        F/U in 3 months    Signed electronically by: Mack Guise, MD  [facility] Endocrinology  [facility] [address] [place], [place] [address] Phone: [phone] FAX: [phone]   [place], [facility] [place] [address] Phone: [phone]  Fax: [phone]  Return to Endocrinology clinic as below: Future Appointments  Date Time Provider [place]  04/28/2021 10:50 AM , Melanie Crazier, MD [facility] None

## 2021-04-28 ENCOUNTER — Ambulatory Visit: Payer: Medicaid Other | Admitting: Internal Medicine

## 2021-08-10 ENCOUNTER — Emergency Department (HOSPITAL_COMMUNITY): Payer: Medicare Other

## 2021-08-10 ENCOUNTER — Encounter (HOSPITAL_COMMUNITY): Payer: Self-pay | Admitting: Emergency Medicine

## 2021-08-10 ENCOUNTER — Inpatient Hospital Stay (HOSPITAL_COMMUNITY)
Admission: EM | Admit: 2021-08-10 | Discharge: 2021-08-25 | DRG: 239 | Disposition: A | Discharge status: Home Health | Payer: Medicare Other | Attending: Internal Medicine | Admitting: Internal Medicine

## 2021-08-10 ENCOUNTER — Other Ambulatory Visit: Payer: Self-pay

## 2021-08-10 DIAGNOSIS — E1122 Type 2 diabetes mellitus with diabetic chronic kidney disease: Secondary | ICD-10-CM | POA: Diagnosis present

## 2021-08-10 DIAGNOSIS — E8809 Other disorders of plasma-protein metabolism, not elsewhere classified: Secondary | ICD-10-CM | POA: Diagnosis present

## 2021-08-10 DIAGNOSIS — I701 Atherosclerosis of renal artery: Secondary | ICD-10-CM | POA: Diagnosis present

## 2021-08-10 DIAGNOSIS — E11628 Type 2 diabetes mellitus with other skin complications: Secondary | ICD-10-CM | POA: Diagnosis present

## 2021-08-10 DIAGNOSIS — D631 Anemia in chronic kidney disease: Secondary | ICD-10-CM | POA: Diagnosis present

## 2021-08-10 DIAGNOSIS — Z992 Dependence on renal dialysis: Secondary | ICD-10-CM

## 2021-08-10 DIAGNOSIS — Z880 Allergy status to penicillin: Secondary | ICD-10-CM

## 2021-08-10 DIAGNOSIS — E11649 Type 2 diabetes mellitus with hypoglycemia without coma: Secondary | ICD-10-CM | POA: Diagnosis present

## 2021-08-10 DIAGNOSIS — A419 Sepsis, unspecified organism: Secondary | ICD-10-CM

## 2021-08-10 DIAGNOSIS — Z91018 Allergy to other foods: Secondary | ICD-10-CM

## 2021-08-10 DIAGNOSIS — Z794 Long term (current) use of insulin: Secondary | ICD-10-CM

## 2021-08-10 DIAGNOSIS — I96 Gangrene, not elsewhere classified: Secondary | ICD-10-CM | POA: Diagnosis present

## 2021-08-10 DIAGNOSIS — D638 Anemia in other chronic diseases classified elsewhere: Secondary | ICD-10-CM | POA: Diagnosis present

## 2021-08-10 DIAGNOSIS — Z833 Family history of diabetes mellitus: Secondary | ICD-10-CM

## 2021-08-10 DIAGNOSIS — E871 Hypo-osmolality and hyponatremia: Secondary | ICD-10-CM

## 2021-08-10 DIAGNOSIS — E11319 Type 2 diabetes mellitus with unspecified diabetic retinopathy without macular edema: Secondary | ICD-10-CM | POA: Diagnosis present

## 2021-08-10 DIAGNOSIS — I120 Hypertensive chronic kidney disease with stage 5 chronic kidney disease or end stage renal disease: Secondary | ICD-10-CM | POA: Diagnosis present

## 2021-08-10 DIAGNOSIS — Z89422 Acquired absence of other left toe(s): Secondary | ICD-10-CM

## 2021-08-10 DIAGNOSIS — R739 Hyperglycemia, unspecified: Secondary | ICD-10-CM

## 2021-08-10 DIAGNOSIS — E1152 Type 2 diabetes mellitus with diabetic peripheral angiopathy with gangrene: Secondary | ICD-10-CM | POA: Diagnosis not present

## 2021-08-10 DIAGNOSIS — E1140 Type 2 diabetes mellitus with diabetic neuropathy, unspecified: Secondary | ICD-10-CM | POA: Diagnosis present

## 2021-08-10 DIAGNOSIS — Z91199 Patient's noncompliance with other medical treatment and regimen due to unspecified reason: Secondary | ICD-10-CM

## 2021-08-10 DIAGNOSIS — H5461 Unqualified visual loss, right eye, normal vision left eye: Secondary | ICD-10-CM | POA: Diagnosis present

## 2021-08-10 DIAGNOSIS — A480 Gas gangrene: Secondary | ICD-10-CM | POA: Diagnosis not present

## 2021-08-10 DIAGNOSIS — E1165 Type 2 diabetes mellitus with hyperglycemia: Secondary | ICD-10-CM

## 2021-08-10 DIAGNOSIS — N186 End stage renal disease: Secondary | ICD-10-CM | POA: Diagnosis present

## 2021-08-10 DIAGNOSIS — M898X9 Other specified disorders of bone, unspecified site: Secondary | ICD-10-CM | POA: Diagnosis present

## 2021-08-10 DIAGNOSIS — L03115 Cellulitis of right lower limb: Secondary | ICD-10-CM | POA: Diagnosis present

## 2021-08-10 DIAGNOSIS — Z79899 Other long term (current) drug therapy: Secondary | ICD-10-CM

## 2021-08-10 DIAGNOSIS — Z20822 Contact with and (suspected) exposure to covid-19: Secondary | ICD-10-CM | POA: Diagnosis present

## 2021-08-10 DIAGNOSIS — B9561 Methicillin susceptible Staphylococcus aureus infection as the cause of diseases classified elsewhere: Secondary | ICD-10-CM | POA: Diagnosis present

## 2021-08-10 LAB — CBC WITH DIFFERENTIAL/PLATELET
Abs Immature Granulocytes: 0.11 10*3/uL — ABNORMAL HIGH (ref 0.00–0.07)
Basophils Absolute: 0 10*3/uL (ref 0.0–0.1)
Basophils Relative: 0 %
Eosinophils Absolute: 0.2 10*3/uL (ref 0.0–0.5)
Eosinophils Relative: 1 %
HCT: 24.2 % — ABNORMAL LOW (ref 36.0–46.0)
Hemoglobin: 7.3 g/dL — ABNORMAL LOW (ref 12.0–15.0)
Immature Granulocytes: 1 %
Lymphocytes Relative: 7 %
Lymphs Abs: 1.1 10*3/uL (ref 0.7–4.0)
MCH: 28.2 pg (ref 26.0–34.0)
MCHC: 30.2 g/dL (ref 30.0–36.0)
MCV: 93.4 fL (ref 80.0–100.0)
Monocytes Absolute: 0.6 10*3/uL (ref 0.1–1.0)
Monocytes Relative: 4 %
Neutro Abs: 13.4 10*3/uL — ABNORMAL HIGH (ref 1.7–7.7)
Neutrophils Relative %: 87 %
Platelets: 348 10*3/uL (ref 150–400)
RBC: 2.59 MIL/uL — ABNORMAL LOW (ref 3.87–5.11)
RDW: 14.6 % (ref 11.5–15.5)
WBC: 15.5 10*3/uL — ABNORMAL HIGH (ref 4.0–10.5)
nRBC: 1.3 % — ABNORMAL HIGH (ref 0.0–0.2)

## 2021-08-10 LAB — COMPREHENSIVE METABOLIC PANEL
ALT: 321 U/L — ABNORMAL HIGH (ref 0–44)
AST: 149 U/L — ABNORMAL HIGH (ref 15–41)
Albumin: 2.4 g/dL — ABNORMAL LOW (ref 3.5–5.0)
Alkaline Phosphatase: 101 U/L (ref 38–126)
Anion gap: 18 — ABNORMAL HIGH (ref 5–15)
BUN: 67 mg/dL — ABNORMAL HIGH (ref 6–20)
CO2: 24 mmol/L (ref 22–32)
Calcium: 9.5 mg/dL (ref 8.9–10.3)
Chloride: 88 mmol/L — ABNORMAL LOW (ref 98–111)
Creatinine, Ser: 12.56 mg/dL — ABNORMAL HIGH (ref 0.44–1.00)
GFR, Estimated: 4 mL/min — ABNORMAL LOW (ref >60)
Glucose, Bld: 473 mg/dL — ABNORMAL HIGH (ref 70–99)
Potassium: 4 mmol/L (ref 3.5–5.1)
Sodium: 130 mmol/L — ABNORMAL LOW (ref 135–145)
Total Bilirubin: 0.8 mg/dL (ref 0.3–1.2)
Total Protein: 8.3 g/dL — ABNORMAL HIGH (ref 6.5–8.1)

## 2021-08-10 LAB — CBG MONITORING, ED: Glucose-Capillary: 432 mg/dL — ABNORMAL HIGH (ref 70–99)

## 2021-08-10 LAB — PROTIME-INR
INR: 1.2 (ref 0.8–1.2)
Prothrombin Time: 15.6 seconds — ABNORMAL HIGH (ref 11.4–15.2)

## 2021-08-10 LAB — LACTIC ACID, PLASMA: Lactic Acid, Venous: 1.5 mmol/L (ref 0.5–1.9)

## 2021-08-10 LAB — APTT: aPTT: 28 seconds (ref 24–36)

## 2021-08-10 NOTE — ED Notes (Signed)
Per pt does not urinate. Here due to pain in rt foot that is chronic. Dialysis pt, fistula in rt arm.

## 2021-08-10 NOTE — ED Provider Notes (Signed)
Emergency Medicine Provider Triage Evaluation Note  [name] , a 40 y.o. female  was evaluated in triage.  Pt complains of generalized weakness onset today.  Patient reports she is a dialysis patient and goes Tuesday, Thursday, Saturday.  She missed today's dialysis session.  Patient has associated pain to right foot with malodorous drainage.  She denies fever, chills, nausea, vomiting, abdominal pain, chest pain, shortness of breath.  Patient notes that she is a diabetic and she has been out of her insulin x1 week.   Review of Systems  Positive: Generalized weakness/fatigue, right foot with malodorous drainage Negative: Chest pain, shortness of breath  Physical Exam  BP (!) 181/81    Pulse 95    Temp 98.7 F (37.1 C) (Oral)    Resp 16    Ht 5\' 1"  (1.549 m)    Wt 72 kg    LMP 07/17/2021    SpO2 100%    BMI 29.99 kg/m  Gen:   Awake, no distress   Resp:  Normal effort  MSK:   Moves extremities without difficulty  Other:  Necrotic right great toe with malodorous drainage.  Tenderness to palpation and increased warmth to right foot.  Medical Decision Making  Medically screening exam initiated at 9:53 PM.  Appropriate orders placed.  Crista Nuon was informed that the remainder of the evaluation will be completed by another provider, this initial triage assessment does not replace that evaluation, and the importance of remaining in the ED until their evaluation is complete.  9:57 PM - Discussed with RN that patient is in need of a room immediately. RN aware and working on room placement.    Hallel Denherder A, PA-C 08/10/21 2157    Sherwood Gambler, MD 08/10/21 2231

## 2021-08-10 NOTE — ED Notes (Signed)
Provided warm blanket.

## 2021-08-10 NOTE — ED Triage Notes (Signed)
Patient arrived with EMS from home , missed hemodialysis today , reports SOB with generalized weakness/fatigue today , CBG= 527 she ran out of insulin for 1 week .

## 2021-08-11 ENCOUNTER — Encounter (HOSPITAL_COMMUNITY)
Admission: EM | Disposition: A | Discharge status: Home Health | Payer: Self-pay | Source: Home / Self Care | Attending: Internal Medicine

## 2021-08-11 ENCOUNTER — Inpatient Hospital Stay (HOSPITAL_COMMUNITY): Payer: Medicare Other | Admitting: Anesthesiology

## 2021-08-11 ENCOUNTER — Encounter (HOSPITAL_COMMUNITY): Payer: Self-pay | Admitting: Family Medicine

## 2021-08-11 DIAGNOSIS — E11319 Type 2 diabetes mellitus with unspecified diabetic retinopathy without macular edema: Secondary | ICD-10-CM | POA: Diagnosis present

## 2021-08-11 DIAGNOSIS — H5461 Unqualified visual loss, right eye, normal vision left eye: Secondary | ICD-10-CM | POA: Diagnosis present

## 2021-08-11 DIAGNOSIS — E1152 Type 2 diabetes mellitus with diabetic peripheral angiopathy with gangrene: Secondary | ICD-10-CM | POA: Diagnosis present

## 2021-08-11 DIAGNOSIS — A480 Gas gangrene: Secondary | ICD-10-CM | POA: Diagnosis present

## 2021-08-11 DIAGNOSIS — Z79899 Other long term (current) drug therapy: Secondary | ICD-10-CM

## 2021-08-11 DIAGNOSIS — Z89422 Acquired absence of other left toe(s): Secondary | ICD-10-CM | POA: Diagnosis not present

## 2021-08-11 DIAGNOSIS — D631 Anemia in chronic kidney disease: Secondary | ICD-10-CM | POA: Diagnosis present

## 2021-08-11 DIAGNOSIS — L03115 Cellulitis of right lower limb: Secondary | ICD-10-CM | POA: Diagnosis present

## 2021-08-11 DIAGNOSIS — E1140 Type 2 diabetes mellitus with diabetic neuropathy, unspecified: Secondary | ICD-10-CM | POA: Diagnosis present

## 2021-08-11 DIAGNOSIS — Z91018 Allergy to other foods: Secondary | ICD-10-CM | POA: Diagnosis not present

## 2021-08-11 DIAGNOSIS — I96 Gangrene, not elsewhere classified: Secondary | ICD-10-CM

## 2021-08-11 DIAGNOSIS — Z91199 Patient's noncompliance with other medical treatment and regimen due to unspecified reason: Secondary | ICD-10-CM

## 2021-08-11 DIAGNOSIS — E1122 Type 2 diabetes mellitus with diabetic chronic kidney disease: Secondary | ICD-10-CM | POA: Diagnosis present

## 2021-08-11 DIAGNOSIS — Z992 Dependence on renal dialysis: Secondary | ICD-10-CM

## 2021-08-11 DIAGNOSIS — Z794 Long term (current) use of insulin: Secondary | ICD-10-CM

## 2021-08-11 DIAGNOSIS — I701 Atherosclerosis of renal artery: Secondary | ICD-10-CM | POA: Diagnosis present

## 2021-08-11 DIAGNOSIS — E11628 Type 2 diabetes mellitus with other skin complications: Secondary | ICD-10-CM | POA: Diagnosis present

## 2021-08-11 DIAGNOSIS — E11649 Type 2 diabetes mellitus with hypoglycemia without coma: Secondary | ICD-10-CM | POA: Diagnosis present

## 2021-08-11 DIAGNOSIS — E1165 Type 2 diabetes mellitus with hyperglycemia: Secondary | ICD-10-CM | POA: Diagnosis present

## 2021-08-11 DIAGNOSIS — E871 Hypo-osmolality and hyponatremia: Secondary | ICD-10-CM | POA: Diagnosis present

## 2021-08-11 DIAGNOSIS — B9561 Methicillin susceptible Staphylococcus aureus infection as the cause of diseases classified elsewhere: Secondary | ICD-10-CM | POA: Diagnosis present

## 2021-08-11 DIAGNOSIS — E8809 Other disorders of plasma-protein metabolism, not elsewhere classified: Secondary | ICD-10-CM | POA: Diagnosis present

## 2021-08-11 DIAGNOSIS — Z20822 Contact with and (suspected) exposure to covid-19: Secondary | ICD-10-CM | POA: Diagnosis present

## 2021-08-11 DIAGNOSIS — N186 End stage renal disease: Secondary | ICD-10-CM | POA: Diagnosis present

## 2021-08-11 DIAGNOSIS — Z880 Allergy status to penicillin: Secondary | ICD-10-CM | POA: Diagnosis not present

## 2021-08-11 DIAGNOSIS — D638 Anemia in other chronic diseases classified elsewhere: Secondary | ICD-10-CM | POA: Diagnosis not present

## 2021-08-11 DIAGNOSIS — I120 Hypertensive chronic kidney disease with stage 5 chronic kidney disease or end stage renal disease: Secondary | ICD-10-CM | POA: Diagnosis present

## 2021-08-11 HISTORY — PX: LOWER EXTREMITY ANGIOGRAPHY: CATH118251

## 2021-08-11 HISTORY — PX: AMPUTATION: SHX166

## 2021-08-11 LAB — BASIC METABOLIC PANEL
Anion gap: 18 — ABNORMAL HIGH (ref 5–15)
BUN: 71 mg/dL — ABNORMAL HIGH (ref 6–20)
CO2: 22 mmol/L (ref 22–32)
Calcium: 9 mg/dL (ref 8.9–10.3)
Chloride: 89 mmol/L — ABNORMAL LOW (ref 98–111)
Creatinine, Ser: 12.7 mg/dL — ABNORMAL HIGH (ref 0.44–1.00)
GFR, Estimated: 3 mL/min — ABNORMAL LOW (ref >60)
Glucose, Bld: 522 mg/dL (ref 70–99)
Potassium: 4.3 mmol/L (ref 3.5–5.1)
Sodium: 129 mmol/L — ABNORMAL LOW (ref 135–145)

## 2021-08-11 LAB — HEMOGLOBIN A1C
Hgb A1c MFr Bld: 10.4 % — ABNORMAL HIGH (ref 4.8–5.6)
Mean Plasma Glucose: 251.78 mg/dL

## 2021-08-11 LAB — CBC
HCT: 23.4 % — ABNORMAL LOW (ref 36.0–46.0)
Hemoglobin: 7.2 g/dL — ABNORMAL LOW (ref 12.0–15.0)
MCH: 29.1 pg (ref 26.0–34.0)
MCHC: 30.8 g/dL (ref 30.0–36.0)
MCV: 94.7 fL (ref 80.0–100.0)
Platelets: 291 10*3/uL (ref 150–400)
RBC: 2.47 MIL/uL — ABNORMAL LOW (ref 3.87–5.11)
RDW: 15 % (ref 11.5–15.5)
WBC: 15 10*3/uL — ABNORMAL HIGH (ref 4.0–10.5)
nRBC: 0.9 % — ABNORMAL HIGH (ref 0.0–0.2)

## 2021-08-11 LAB — POCT ACTIVATED CLOTTING TIME: Activated Clotting Time: 239 seconds

## 2021-08-11 LAB — HEPATITIS B SURFACE ANTIGEN: Hepatitis B Surface Ag: NONREACTIVE

## 2021-08-11 LAB — GLUCOSE, CAPILLARY
Glucose-Capillary: 267 mg/dL — ABNORMAL HIGH (ref 70–99)
Glucose-Capillary: 224 mg/dL — ABNORMAL HIGH (ref 70–99)
Glucose-Capillary: 227 mg/dL — ABNORMAL HIGH (ref 70–99)
Glucose-Capillary: 225 mg/dL — ABNORMAL HIGH (ref 70–99)

## 2021-08-11 LAB — HIV ANTIBODY (ROUTINE TESTING W REFLEX): HIV Screen 4th Generation wRfx: NONREACTIVE

## 2021-08-11 LAB — HEPATITIS B SURFACE ANTIBODY,QUALITATIVE: Hep B S Ab: REACTIVE — AB

## 2021-08-11 LAB — I-STAT BETA HCG BLOOD, ED (MC, WL, AP ONLY): I-stat hCG, quantitative: <5 m[IU]/mL (ref <5)

## 2021-08-11 LAB — RESP PANEL BY RT-PCR (FLU A&B, COVID) ARPGX2
Influenza A by PCR: NEGATIVE
Influenza B by PCR: NEGATIVE
SARS Coronavirus 2 by RT PCR: NEGATIVE

## 2021-08-11 LAB — CBG MONITORING, ED: Glucose-Capillary: 405 mg/dL — ABNORMAL HIGH (ref 70–99)

## 2021-08-11 SURGERY — AMPUTATION, FOOT, PARTIAL
Anesthesia: General | Site: Foot | Laterality: Right

## 2021-08-11 SURGERY — LOWER EXTREMITY ANGIOGRAPHY
Anesthesia: LOCAL

## 2021-08-11 MED ORDER — DIPHENHYDRAMINE HCL 50 MG/ML IJ SOLN
12.5000 mg | Freq: Four times a day (QID) | INTRAMUSCULAR | Status: DC | PRN
  Administered 2021-08-11 – 2021-08-17 (×4): 12.5 mg via INTRAVENOUS
  Filled 2021-08-11 (×4): qty 1

## 2021-08-11 MED ORDER — FENTANYL CITRATE (PF) 100 MCG/2ML IJ SOLN
INTRAMUSCULAR | Status: DC | PRN
  Administered 2021-08-11 (×2): 25 ug via INTRAVENOUS
  Administered 2021-08-11: 50 ug via INTRAVENOUS

## 2021-08-11 MED ORDER — FENTANYL CITRATE (PF) 250 MCG/5ML IJ SOLN
INTRAMUSCULAR | Status: DC | PRN
  Administered 2021-08-11 (×2): 50 ug via INTRAVENOUS

## 2021-08-11 MED ORDER — PROPOFOL 10 MG/ML IV BOLUS
INTRAVENOUS | Status: AC
  Filled 2021-08-11: qty 20

## 2021-08-11 MED ORDER — LIDOCAINE HCL (PF) 1 % IJ SOLN
INTRAMUSCULAR | Status: DC | PRN
  Administered 2021-08-11: 15 mL via INTRADERMAL

## 2021-08-11 MED ORDER — CLINDAMYCIN PHOSPHATE 600 MG/50ML IV SOLN
600.0000 mg | Freq: Once | INTRAVENOUS | Status: AC
  Administered 2021-08-11: 01:00:00 600 mg via INTRAVENOUS
  Filled 2021-08-11: qty 50

## 2021-08-11 MED ORDER — LABETALOL HCL 5 MG/ML IV SOLN: 10.0000 mg | INTRAVENOUS | Status: DC | PRN

## 2021-08-11 MED ORDER — ACETAMINOPHEN 325 MG PO TABS
650.0000 mg | ORAL_TABLET | Freq: Four times a day (QID) | ORAL | Status: DC | PRN
  Administered 2021-08-12 – 2021-08-25 (×7): 650 mg via ORAL
  Filled 2021-08-11 (×9): qty 2

## 2021-08-11 MED ORDER — ACETAMINOPHEN 325 MG PO TABS: 650.0000 mg | ORAL_TABLET | ORAL | Status: DC | PRN

## 2021-08-11 MED ORDER — SODIUM CHLORIDE 0.9 % IV SOLN: 250.0000 mL | INTRAVENOUS | Status: DC | PRN

## 2021-08-11 MED ORDER — SEVELAMER CARBONATE 0.8 G PO PACK
0.8000 g | PACK | Freq: Three times a day (TID) | ORAL | Status: DC
  Administered 2021-08-12 – 2021-08-14 (×8): 0.8 g via ORAL
  Filled 2021-08-11 (×13): qty 1

## 2021-08-11 MED ORDER — LOSARTAN POTASSIUM 50 MG PO TABS
50.0000 mg | ORAL_TABLET | Freq: Every day | ORAL | Status: DC
  Administered 2021-08-13 – 2021-08-25 (×10): 50 mg via ORAL
  Filled 2021-08-11 (×13): qty 1

## 2021-08-11 MED ORDER — MIDAZOLAM HCL 2 MG/2ML IJ SOLN
INTRAMUSCULAR | Status: AC
  Filled 2021-08-11: qty 2

## 2021-08-11 MED ORDER — INSULIN ASPART 100 UNIT/ML IJ SOLN
10.0000 [IU] | Freq: Once | INTRAMUSCULAR | Status: AC
  Administered 2021-08-11: 05:00:00 10 [IU] via SUBCUTANEOUS

## 2021-08-11 MED ORDER — PROTAMINE SULFATE 10 MG/ML IV SOLN
INTRAVENOUS | Status: AC
  Filled 2021-08-11: qty 5

## 2021-08-11 MED ORDER — HEPARIN SODIUM (PORCINE) 1000 UNIT/ML IJ SOLN
INTRAMUSCULAR | Status: DC | PRN
  Administered 2021-08-11: 7000 [IU] via INTRAVENOUS

## 2021-08-11 MED ORDER — SODIUM CHLORIDE 0.9% FLUSH: 3.0000 mL | INTRAVENOUS | Status: DC | PRN

## 2021-08-11 MED ORDER — ONDANSETRON HCL 4 MG/2ML IJ SOLN: 4.0000 mg | Freq: Four times a day (QID) | INTRAMUSCULAR | Status: DC | PRN

## 2021-08-11 MED ORDER — HEPARIN SODIUM (PORCINE) 1000 UNIT/ML IJ SOLN
INTRAMUSCULAR | Status: AC
  Filled 2021-08-11: qty 10

## 2021-08-11 MED ORDER — ORAL CARE MOUTH RINSE: 15.0000 mL | Freq: Once | OROMUCOSAL | Status: DC

## 2021-08-11 MED ORDER — CIPROFLOXACIN IN D5W 400 MG/200ML IV SOLN
400.0000 mg | Freq: Once | INTRAVENOUS | Status: AC
  Administered 2021-08-11: 01:00:00 400 mg via INTRAVENOUS
  Filled 2021-08-11: qty 200

## 2021-08-11 MED ORDER — VANCOMYCIN HCL 750 MG/150ML IV SOLN
750.0000 mg | INTRAVENOUS | Status: DC
Start: 2021-08-12 — End: 2021-08-17
  Administered 2021-08-12 – 2021-08-15 (×2): 750 mg via INTRAVENOUS
  Filled 2021-08-11 (×5): qty 150

## 2021-08-11 MED ORDER — IODIXANOL 320 MG/ML IV SOLN
INTRAVENOUS | Status: DC | PRN
  Administered 2021-08-11: 13:00:00 110 mL via INTRA_ARTERIAL

## 2021-08-11 MED ORDER — CINACALCET HCL 30 MG PO TABS
90.0000 mg | ORAL_TABLET | Freq: Every day | ORAL | Status: DC
  Administered 2021-08-12 – 2021-08-23 (×11): 90 mg via ORAL
  Filled 2021-08-11 (×12): qty 3

## 2021-08-11 MED ORDER — KETOROLAC TROMETHAMINE 30 MG/ML IJ SOLN: 30.0000 mg | Freq: Once | INTRAMUSCULAR | Status: DC | PRN

## 2021-08-11 MED ORDER — INSULIN GLARGINE-YFGN 100 UNIT/ML ~~LOC~~ SOLN
20.0000 [IU] | Freq: Every day | SUBCUTANEOUS | Status: DC
  Administered 2021-08-12 – 2021-08-14 (×3): 20 [IU] via SUBCUTANEOUS
  Filled 2021-08-11 (×4): qty 0.2

## 2021-08-11 MED ORDER — FENTANYL CITRATE PF 50 MCG/ML IJ SOSY
25.0000 ug | PREFILLED_SYRINGE | INTRAMUSCULAR | Status: AC | PRN
  Administered 2021-08-11 (×2): 25 ug via INTRAVENOUS
  Filled 2021-08-11 (×2): qty 1

## 2021-08-11 MED ORDER — 0.9 % SODIUM CHLORIDE (POUR BTL) OPTIME
TOPICAL | Status: DC | PRN
  Administered 2021-08-11: 17:00:00 1000 mL

## 2021-08-11 MED ORDER — SODIUM CHLORIDE 0.9 % IV SOLN: INTRAVENOUS | Status: DC

## 2021-08-11 MED ORDER — INSULIN GLARGINE-YFGN 100 UNIT/ML ~~LOC~~ SOLN
20.0000 [IU] | Freq: Every day | SUBCUTANEOUS | Status: DC
  Filled 2021-08-11: qty 0.2

## 2021-08-11 MED ORDER — PROPOFOL 10 MG/ML IV BOLUS
INTRAVENOUS | Status: DC | PRN
  Administered 2021-08-11: 150 mg via INTRAVENOUS

## 2021-08-11 MED ORDER — PHENYLEPHRINE HCL-NACL 20-0.9 MG/250ML-% IV SOLN
INTRAVENOUS | Status: DC | PRN
  Administered 2021-08-11: 20 ug/min via INTRAVENOUS

## 2021-08-11 MED ORDER — SODIUM CHLORIDE 0.9% FLUSH
3.0000 mL | Freq: Two times a day (BID) | INTRAVENOUS | Status: DC
  Administered 2021-08-11 – 2021-08-24 (×21): 3 mL via INTRAVENOUS

## 2021-08-11 MED ORDER — FENTANYL CITRATE PF 50 MCG/ML IJ SOSY
100.0000 ug | PREFILLED_SYRINGE | Freq: Once | INTRAMUSCULAR | Status: AC
  Administered 2021-08-11: 100 ug via INTRAVENOUS
  Filled 2021-08-11: qty 2

## 2021-08-11 MED ORDER — VANCOMYCIN HCL 1500 MG/300ML IV SOLN
1500.0000 mg | Freq: Once | INTRAVENOUS | Status: AC
  Administered 2021-08-11: 02:00:00 1500 mg via INTRAVENOUS
  Filled 2021-08-11: qty 300

## 2021-08-11 MED ORDER — HYDRALAZINE HCL 20 MG/ML IJ SOLN
5.0000 mg | INTRAMUSCULAR | Status: DC | PRN
  Administered 2021-08-14: 5 mg via INTRAVENOUS
  Filled 2021-08-11: qty 1

## 2021-08-11 MED ORDER — CHLORHEXIDINE GLUCONATE 0.12 % MT SOLN: 15.0000 mL | Freq: Once | OROMUCOSAL | Status: DC

## 2021-08-11 MED ORDER — ONDANSETRON HCL 4 MG/2ML IJ SOLN
INTRAMUSCULAR | Status: DC | PRN
  Administered 2021-08-11: 4 mg via INTRAVENOUS

## 2021-08-11 MED ORDER — HEPARIN SODIUM (PORCINE) 5000 UNIT/ML IJ SOLN
5000.0000 [IU] | Freq: Three times a day (TID) | INTRAMUSCULAR | Status: DC
  Administered 2021-08-11 – 2021-08-25 (×36): 5000 [IU] via SUBCUTANEOUS
  Filled 2021-08-11 (×36): qty 1

## 2021-08-11 MED ORDER — SODIUM CHLORIDE 0.9% FLUSH
3.0000 mL | Freq: Two times a day (BID) | INTRAVENOUS | Status: DC
  Administered 2021-08-11 – 2021-08-18 (×10): 3 mL via INTRAVENOUS

## 2021-08-11 MED ORDER — INSULIN ASPART 100 UNIT/ML IJ SOLN
0.0000 [IU] | Freq: Three times a day (TID) | INTRAMUSCULAR | Status: DC
Start: 2021-08-11 — End: 2021-08-25
  Administered 2021-08-11: 08:00:00 6 [IU] via SUBCUTANEOUS
  Administered 2021-08-11 – 2021-08-12 (×3): 2 [IU] via SUBCUTANEOUS
  Administered 2021-08-13 (×2): 3 [IU] via SUBCUTANEOUS
  Administered 2021-08-13: 2 [IU] via SUBCUTANEOUS
  Administered 2021-08-13 – 2021-08-14 (×2): 3 [IU] via SUBCUTANEOUS
  Administered 2021-08-14: 4 [IU] via SUBCUTANEOUS
  Administered 2021-08-14 (×2): 3 [IU] via SUBCUTANEOUS
  Administered 2021-08-15: 1 [IU] via SUBCUTANEOUS
  Administered 2021-08-15 (×3): 2 [IU] via SUBCUTANEOUS
  Administered 2021-08-16: 3 [IU] via SUBCUTANEOUS
  Administered 2021-08-18: 2 [IU] via SUBCUTANEOUS
  Administered 2021-08-21 – 2021-08-22 (×2): 1 [IU] via SUBCUTANEOUS
  Administered 2021-08-22: 0 [IU] via SUBCUTANEOUS
  Administered 2021-08-22 – 2021-08-23 (×2): 1 [IU] via SUBCUTANEOUS

## 2021-08-11 MED ORDER — CHLORHEXIDINE GLUCONATE CLOTH 2 % EX PADS
6.0000 | MEDICATED_PAD | Freq: Every day | CUTANEOUS | Status: DC
  Administered 2021-08-12 – 2021-08-14 (×3): 6 via TOPICAL

## 2021-08-11 MED ORDER — CLINDAMYCIN PHOSPHATE 600 MG/50ML IV SOLN
600.0000 mg | Freq: Three times a day (TID) | INTRAVENOUS | Status: DC
  Administered 2021-08-11 – 2021-08-14 (×10): 600 mg via INTRAVENOUS
  Filled 2021-08-11 (×10): qty 50

## 2021-08-11 MED ORDER — FENTANYL CITRATE PF 50 MCG/ML IJ SOSY
50.0000 ug | PREFILLED_SYRINGE | Freq: Once | INTRAMUSCULAR | Status: AC
  Administered 2021-08-11: 02:00:00 50 ug via INTRAVENOUS
  Filled 2021-08-11: qty 1

## 2021-08-11 MED ORDER — HYDROMORPHONE HCL 1 MG/ML IJ SOLN
INTRAMUSCULAR | Status: AC
  Filled 2021-08-11: qty 1

## 2021-08-11 MED ORDER — CIPROFLOXACIN IN D5W 400 MG/200ML IV SOLN
400.0000 mg | INTRAVENOUS | Status: DC
  Administered 2021-08-11 – 2021-08-16 (×6): 400 mg via INTRAVENOUS
  Filled 2021-08-11 (×6): qty 200

## 2021-08-11 MED ORDER — SODIUM CHLORIDE 0.9 % IV SOLN
250.0000 mL | INTRAVENOUS | Status: DC | PRN
  Administered 2021-08-14: 500 mL via INTRAVENOUS

## 2021-08-11 MED ORDER — CIPROFLOXACIN IN D5W 400 MG/200ML IV SOLN: 400.0000 mg | Freq: Two times a day (BID) | INTRAVENOUS | Status: DC

## 2021-08-11 MED ORDER — ONDANSETRON HCL 4 MG/2ML IJ SOLN: 4.0000 mg | Freq: Once | INTRAMUSCULAR | Status: DC | PRN

## 2021-08-11 MED ORDER — FENTANYL CITRATE (PF) 250 MCG/5ML IJ SOLN
INTRAMUSCULAR | Status: AC
  Filled 2021-08-11: qty 5

## 2021-08-11 MED ORDER — HEPARIN (PORCINE) IN NACL 1000-0.9 UT/500ML-% IV SOLN
INTRAVENOUS | Status: AC
  Filled 2021-08-11: qty 1000

## 2021-08-11 MED ORDER — LIDOCAINE HCL (PF) 1 % IJ SOLN
INTRAMUSCULAR | Status: AC
  Filled 2021-08-11: qty 30

## 2021-08-11 MED ORDER — RENA-VITE PO TABS
1.0000 | ORAL_TABLET | Freq: Every day | ORAL | Status: DC
  Administered 2021-08-12 – 2021-08-24 (×13): 1 via ORAL
  Filled 2021-08-11 (×14): qty 1

## 2021-08-11 MED ORDER — LIDOCAINE 2% (20 MG/ML) 5 ML SYRINGE
INTRAMUSCULAR | Status: DC | PRN
  Administered 2021-08-11: 100 mg via INTRAVENOUS

## 2021-08-11 MED ORDER — FENTANYL CITRATE (PF) 100 MCG/2ML IJ SOLN
INTRAMUSCULAR | Status: AC
  Filled 2021-08-11: qty 2

## 2021-08-11 MED ORDER — SENNOSIDES-DOCUSATE SODIUM 8.6-50 MG PO TABS: 1.0000 | ORAL_TABLET | Freq: Every evening | ORAL | Status: DC | PRN

## 2021-08-11 MED ORDER — MIDAZOLAM HCL 2 MG/2ML IJ SOLN
INTRAMUSCULAR | Status: DC | PRN
  Administered 2021-08-11 (×2): 1 mg via INTRAVENOUS

## 2021-08-11 MED ORDER — HYDROMORPHONE HCL 1 MG/ML IJ SOLN
0.2500 mg | INTRAMUSCULAR | Status: DC | PRN
  Administered 2021-08-11: 18:00:00 0.5 mg via INTRAVENOUS

## 2021-08-11 MED ORDER — LORAZEPAM 2 MG/ML IJ SOLN: 1.0000 mg | Freq: Once | INTRAMUSCULAR | Status: DC

## 2021-08-11 MED ORDER — DOXERCALCIFEROL 4 MCG/2ML IV SOLN
9.0000 ug | INTRAVENOUS | Status: DC
Start: 2021-08-12 — End: 2021-08-14
  Administered 2021-08-12: 9 ug via INTRAVENOUS
  Filled 2021-08-11 (×2): qty 6

## 2021-08-11 MED ORDER — ACETAMINOPHEN 650 MG RE SUPP: 650.0000 mg | Freq: Four times a day (QID) | RECTAL | Status: DC | PRN

## 2021-08-11 SURGICAL SUPPLY — 43 items
BAG COUNTER SPONGE SURGICOUNT (BAG) ×2 IMPLANT
BAG SPNG CNTER NS LX DISP (BAG) ×1
BLADE AVERAGE 25X9 (BLADE) IMPLANT
BLADE CORE FAN STRYKER (BLADE) ×1 IMPLANT
BLADE SAW SGTL 81X20 HD (BLADE) ×2 IMPLANT
BNDG CONFORM 3 STRL LF (GAUZE/BANDAGES/DRESSINGS) IMPLANT
BNDG ELASTIC 4X5.8 VLCR STR LF (GAUZE/BANDAGES/DRESSINGS) ×2 IMPLANT
BNDG GAUZE ELAST 4 BULKY (GAUZE/BANDAGES/DRESSINGS) ×2 IMPLANT
CANISTER SUCT 3000ML PPV (MISCELLANEOUS) ×2 IMPLANT
CLIP TI MEDIUM 6 (CLIP) ×1 IMPLANT
COVER SURGICAL LIGHT HANDLE (MISCELLANEOUS) ×2 IMPLANT
DRAPE HALF SHEET 40X57 (DRAPES) ×2 IMPLANT
ELECT REM PT RETURN 9FT ADLT (ELECTROSURGICAL) ×2
ELECTRODE REM PT RTRN 9FT ADLT (ELECTROSURGICAL) ×1 IMPLANT
GAUZE SPONGE 4X4 12PLY STRL (GAUZE/BANDAGES/DRESSINGS) ×2 IMPLANT
GLOVE SRG 8 PF TXTR STRL LF DI (GLOVE) ×1 IMPLANT
GLOVE SURG POLYISO LF SZ7.5 (GLOVE) ×2 IMPLANT
GLOVE SURG UNDER POLY LF SZ8 (GLOVE) ×2
GOWN STRL REUS W/ TWL LRG LVL3 (GOWN DISPOSABLE) ×2 IMPLANT
GOWN STRL REUS W/ TWL XL LVL3 (GOWN DISPOSABLE) ×1 IMPLANT
GOWN STRL REUS W/TWL LRG LVL3 (GOWN DISPOSABLE) ×4
GOWN STRL REUS W/TWL XL LVL3 (GOWN DISPOSABLE) ×2
KIT BASIN OR (CUSTOM PROCEDURE TRAY) ×2 IMPLANT
KIT TURNOVER KIT B (KITS) ×2 IMPLANT
NDL HYPO 25GX1X1/2 BEV (NEEDLE) IMPLANT
NEEDLE HYPO 25GX1X1/2 BEV (NEEDLE) IMPLANT
NS IRRIG 1000ML POUR BTL (IV SOLUTION) ×2 IMPLANT
PACK GENERAL/GYN (CUSTOM PROCEDURE TRAY) ×2 IMPLANT
PAD ARMBOARD 7.5X6 YLW CONV (MISCELLANEOUS) ×4 IMPLANT
SPECIMEN JAR SMALL (MISCELLANEOUS) ×2 IMPLANT
SUT ETHILON 3 0 PS 1 (SUTURE) ×2 IMPLANT
SUT SILK 2 0 (SUTURE) ×2
SUT SILK 2 0 SH CR/8 (SUTURE) ×2 IMPLANT
SUT SILK 2-0 18XBRD TIE 12 (SUTURE) IMPLANT
SUT SILK 3 0 (SUTURE) ×2
SUT SILK 3-0 18XBRD TIE 12 (SUTURE) IMPLANT
SUT VIC AB 2-0 CT1 27 (SUTURE) ×2
SUT VIC AB 2-0 CT1 TAPERPNT 27 (SUTURE) IMPLANT
SYR CONTROL 10ML LL (SYRINGE) IMPLANT
TOWEL GREEN STERILE (TOWEL DISPOSABLE) ×4 IMPLANT
TOWEL GREEN STERILE FF (TOWEL DISPOSABLE) ×2 IMPLANT
UNDERPAD 30X36 HEAVY ABSORB (UNDERPADS AND DIAPERS) ×2 IMPLANT
WATER STERILE IRR 1000ML POUR (IV SOLUTION) ×2 IMPLANT

## 2021-08-11 SURGICAL SUPPLY — 23 items
BALLN COYOTE OTW 1.5X40X150 (BALLOONS) ×4
BALLN JADE .018 2.0 X 40 (BALLOONS) ×2
BALLN STERLING OTW 2X100X150 (BALLOONS) ×2
BALLN STERLING OTW 2X20X150 (BALLOONS) ×2
BALLOON COYOTE OTW 1.5X40X150 (BALLOONS) IMPLANT
BALLOON JADE .018 2.0 X 40 (BALLOONS) IMPLANT
BALLOON STERLING OTW 2X100X150 (BALLOONS) IMPLANT
BALLOON STERLING OTW 2X20X150 (BALLOONS) IMPLANT
CATH OMNI FLUSH 5F 65CM (CATHETERS) ×1 IMPLANT
DEVICE VASC CLSR CELT ART 5 (Vascular Products) ×1 IMPLANT
GUIDEWIRE ZILIENT 12G 014X300 (WIRE) ×2 IMPLANT
KIT ENCORE 26 ADVANTAGE (KITS) ×1 IMPLANT
KIT MICROPUNCTURE NIT STIFF (SHEATH) ×1 IMPLANT
KIT PV (KITS) ×2 IMPLANT
SHEATH DESTINATION MP 5FR 45CM (SHEATH) ×1 IMPLANT
SHEATH PINNACLE 5F 10CM (SHEATH) ×1 IMPLANT
STOPCOCK MORSE 400PSI 3WAY (MISCELLANEOUS) ×1 IMPLANT
SYR MEDRAD MARK 7 150ML (SYRINGE) ×2 IMPLANT
TRANSDUCER W/STOPCOCK (MISCELLANEOUS) ×2 IMPLANT
TRAY PV CATH (CUSTOM PROCEDURE TRAY) ×2 IMPLANT
TUBING CIL FLEX 10 FLL-RA (TUBING) ×1 IMPLANT
WIRE BENTSON .035X145CM (WIRE) ×1 IMPLANT
WIRE G V18X300CM (WIRE) ×3 IMPLANT

## 2021-08-11 NOTE — ED Notes (Signed)
Pt transported to cath lab by cath lab staff

## 2021-08-11 NOTE — Anesthesia Postprocedure Evaluation (Signed)
Anesthesia Post Note  Patient: [name]  Procedure(s) Performed: TRANSMETATARSAL AMPUTATION OF RIGHT FOOT (Right: Foot)     Patient location during evaluation: PACU Anesthesia Type: General Level of consciousness: awake and alert Pain management: pain level controlled Vital Signs Assessment: post-procedure vital signs reviewed and stable Respiratory status: spontaneous breathing, nonlabored ventilation, respiratory function stable and patient connected to nasal cannula oxygen Cardiovascular status: blood pressure returned to baseline and stable Postop Assessment: no apparent nausea or vomiting Anesthetic complications: no   No notable events documented.  Last Vitals:  Vitals:   08/11/21 1810 08/11/21 1825  BP: 129/67 120/66  Pulse: 71   Resp: 10 11  Temp:  (!) 36.3 C  SpO2: 100% 100%    Last Pain:  Vitals:   08/11/21 1825  TempSrc:   PainSc: Asleep                 Blase Beckner S

## 2021-08-11 NOTE — ED Notes (Signed)
Pt states she usually goes to HD T/Th/Sat

## 2021-08-11 NOTE — H&P (View-Only) (Signed)
Vascular and Vein Specialist of [facility]  Patient name: [name] MRN: [id number] DOB: 1981/08/12 Sex: female   REQUESTING PROVIDER:    ER   REASON FOR CONSULT:    Infected right great toe  HISTORY OF PRESENT ILLNESS:   [name] is a 40 y.o. female, who presented to the emergency department on 08/11/2021 with a 1 week history of right foot and toe pain that has progressively gotten worse.  The patient suffers from end-stage renal disease, on dialysis Tuesday Thursday Saturday.  Her last dialysis was Tuesday.  She also suffers from type 2 diabetes complicated by neuropathy.  She is noncompliant with medical treatment.  She is a non-smoker.  PAST MEDICAL HISTORY    Past Medical History:  Diagnosis Date   Anemia    Anxiety    Blind right eye 2008   Diabetes mellitus without complication ([place])    Dialysis patient ([place])    ESRD (end stage renal disease) ([place])    Dialysis T/Th/Sa     FAMILY HISTORY   Family History  Problem Relation Age of Onset   Diabetes Mother    Diabetes Father     SOCIAL HISTORY:   Social History   Socioeconomic History   Marital status: Single    Spouse name: Not on file   Number of children: Not on file   Years of education: Not on file   Highest education level: Not on file  Occupational History   Not on file  Tobacco Use   Smoking status: Never   Smokeless tobacco: Never  Vaping Use   Vaping Use: Never used  Substance and Sexual Activity   Alcohol use: No   Drug use: No   Sexual activity: Not on file  Other Topics Concern   Not on file  Social History Narrative   Not on file   Social Determinants of Health   Financial Resource Strain: Not on file  Food Insecurity: Not on file  Transportation Needs: Not on file  Physical Activity: Not on file  Stress: Not on file  Social Connections: Not on file  Intimate Partner Violence: Not on file    ALLERGIES:    Allergies  Allergen  Reactions   Morphine Rash and Other (See Comments)   Peanut-Containing Drug Products Anaphylaxis and Hives   Penicillins Anaphylaxis   Chocolate Hives    CURRENT MEDICATIONS:    Current Facility-Administered Medications  Medication Dose Route Frequency Provider Last Rate Last Admin   0.9 %  sodium chloride infusion  250 mL Intravenous PRN Chotiner, Yevonne Aline, MD       acetaminophen (TYLENOL) tablet 650 mg  650 mg Oral Q6H PRN Chotiner, Yevonne Aline, MD       Or   acetaminophen (TYLENOL) suppository 650 mg  650 mg Rectal Q6H PRN Chotiner, Yevonne Aline, MD       cinacalcet (SENSIPAR) tablet 90 mg  90 mg Oral Daily Chotiner, Yevonne Aline, MD       ciprofloxacin (CIPRO) IVPB 400 mg  400 mg Intravenous Q24H Chotiner, Yevonne Aline, MD       clindamycin (CLEOCIN) IVPB 600 mg  600 mg Intravenous Q8H Chotiner, Yevonne Aline, MD       heparin injection 5,000 Units  5,000 Units Subcutaneous Q8H Chotiner, Yevonne Aline, MD   5,000 Units at 08/11/21 0516   insulin aspart (novoLOG) injection 0-6 Units  0-6 Units Subcutaneous TID WC & HS Chotiner, Yevonne Aline, MD       insulin  glargine-yfgn (SEMGLEE) injection 20 Units  20 Units Subcutaneous Daily Chotiner, Yevonne Aline, MD       losartan (COZAAR) tablet 50 mg  50 mg Oral Daily Chotiner, Yevonne Aline, MD       multivitamin (RENA-VIT) tablet 1 tablet  1 tablet Oral QHS Chotiner, Yevonne Aline, MD       senna-docusate (Senokot-S) tablet 1 tablet  1 tablet Oral QHS PRN Chotiner, Yevonne Aline, MD       sevelamer carbonate (RENVELA) packet 0.8 g  0.8 g Oral TID WC Chotiner, Yevonne Aline, MD       sodium chloride flush (NS) 0.9 % injection 3 mL  3 mL Intravenous Q12H Chotiner, Yevonne Aline, MD       sodium chloride flush (NS) 0.9 % injection 3 mL  3 mL Intravenous PRN Chotiner, Yevonne Aline, MD       Derrill Memo ON 08/12/2021] vancomycin (VANCOREADY) IVPB 750 mg/150 mL  750 mg Intravenous Q T,Th,Sa-HD Chotiner, Yevonne Aline, MD       Current Outpatient Medications  Medication Sig Dispense Refill    Accu-Chek FastClix Lancets MISC Use as instructed to check blood sugar up to TID. E11.22 102 each 11   Biotin w/ Vitamins C & E (HAIR/SKIN/NAILS PO) Take 1 capsule by mouth daily.     Blood Glucose Monitoring Suppl (ACCU-CHEK GUIDE ME) w/Device KIT 1 kit by Does not apply route 3 (three) times daily. Use to check BG at home up to 3 times daily. E11.22 1 kit 0   calcitRIOL (ROCALTROL) 0.25 MCG capsule Take 0.25 mcg by mouth 2 (two) times daily.      cinacalcet (SENSIPAR) 90 MG tablet Take 90 mg by mouth daily.      Continuous Blood Gluc Receiver (DEXCOM G6 RECEIVER) DEVI 1 Device by Does not apply route as directed. 1 each 0   Continuous Blood Gluc Sensor (DEXCOM G6 SENSOR) MISC 1 Device by Does not apply route as directed. 9 each 3   Continuous Blood Gluc Transmit (DEXCOM G6 TRANSMITTER) MISC 1 Device by Does not apply route as directed. 1 each 3   FOSRENOL 1000 MG PACK Take 2,000 mg by mouth See admin instructions. Take 2000 mg ( 2 packs) by mouth with meals three times daily and 1 pack (1063m) twice daily with a snack     glucose blood (ACCU-CHEK GUIDE) test strip Use as instructed to check blood sugar up to TID. E11.22 100 each 12   insulin aspart (NOVOLOG FLEXPEN) 100 UNIT/ML FlexPen Inject 5 Units into the skin 3 (three) times daily with meals. Max daily 40 units with correction scale 15 mL 11   insulin glargine (LANTUS SOLOSTAR) 100 UNIT/ML Solostar Pen Inject 20 Units into the skin daily. 15 mL 11   Insulin Pen Needle 32G X 4 MM MISC 1 Device by Does not apply route as directed. 150 each 11   lidocaine-prilocaine (EMLA) cream Apply 1 application topically as directed.      losartan (COZAAR) 50 MG tablet Take 1 tablet (50 mg total) by mouth daily. 30 tablet 2   multivitamin (RENA-VIT) TABS tablet Take 1 tablet by mouth daily. 90 tablet 3   sevelamer carbonate (RENVELA) 0.8 g PACK packet Take 0.8 g by mouth 3 (three) times daily with meals.      REVIEW OF SYSTEMS:   _0  denotes positive  finding, _1  denotes negative finding Cardiac  Comments:  Chest pain or chest pressure:    Shortness of breath upon exertion:  Short of breath when lying flat:    Irregular heart rhythm:        Vascular    Pain in calf, thigh, or hip brought on by ambulation:    Pain in feet at night that wakes you up from your sleep:     Blood clot in your veins:    Leg swelling:         Pulmonary    Oxygen at home:    Productive cough:     Wheezing:         Neurologic    Sudden weakness in arms or legs:     Sudden numbness in arms or legs:     Sudden onset of difficulty speaking or slurred speech:    Temporary loss of vision in one eye:     Problems with dizziness:         Gastrointestinal    Blood in stool:      Vomited blood:         Genitourinary    Burning when urinating:     Blood in urine:        Psychiatric    Major depression:         Hematologic    Bleeding problems:    Problems with blood clotting too easily:        Skin    Rashes or ulcers: x       Constitutional    Fever or chills:     PHYSICAL EXAM:   Vitals:   08/11/21 0300 08/11/21 0445 08/11/21 0500 08/11/21 0600  BP: (!) 173/73 (!) 181/82 (!) 177/75 (!) 155/68  Pulse: 82 81 80 79  Resp: _0 Temp:      TempSrc:      SpO2: 100% 100% 100% 100%  Weight:      Height:        GENERAL: The patient is a well-nourished female, in no acute distress. The vital signs are documented above. CARDIAC: There is a regular rate and rhythm.  VASCULAR: I could not palpate pedal pulses PULMONARY: Nonlabored respirations ABDOMEN: Soft and non-tender  MUSCULOSKELETAL: There are no major deformities or cyanosis. NEUROLOGIC: No focal weakness or paresthesias are detected. SKIN: See photo belowPSYCHIATRIC: The patient has a normal affect.   STUDIES:   I have reviewed her Xray with the following findings:  Soft tissue swelling and soft tissue gas over the right first toe and dorsal forefoot consistent with  gas gangrene. No radiographic evidence of osteomyelitis.  ASSESSMENT and PLAN   Right great toe diabetic foot infection: I discussed with the patient that she is going to need amputation of the infected/necrotic areas of her foot.  This could potentially the a transmetatarsal amputation or possibly a below-knee amputation.  She does have gas on x-ray and so I think this needs to be done urgently.  Prior to proceeding with amputation, I would like to do an arteriogram to determine the blood flow to her foot.  This was discussed with the patient and she wants to get everything done as soon as possible.  We will proceed  Continue IV antibiotics.  Keep patient n.p.o.   Leia Alf, MD, FACS [facility]ists of [facility] [phone] Pager [phone]

## 2021-08-11 NOTE — H&P (Signed)
History and Physical    Anvi Mangal SEG:[id number] DOB: 03/15/1981 DOA: 08/10/2021  PCP: [facility]   Patient coming from: Home  Chief Complaint: Right foot pain, fatigue  HPI: [name] is a 40 y.o. female with medical history significant for DMT2, ESRD with HD on Tu,Th, Sat, neuropathy, HTN with poor compliance with medical treatment and HD presents with complaint of right foot pain for past few days.  She reports that the pain is gradually gotten worse the last 2 days and is now severe and she rates the pain as a 9 out of 10.  She is required multiple doses of fentanyl for pain control in the emergency room.  She states that the pain was so bad she did not go to dialysis yesterday.  Her last dialysis session was Tuesday.  She is very reluctant to answer any questions about her medications or how long she has not been taking them.  She finally does state that she has not taken her insulin in over a month.  She does not give a good explanation of why she has gone so long without treating her diabetes.  She states her sugars are "high" but does not say when she last took her blood sugar at home or exactly how high it has been running.  She reports she has had generalized fatigue and weakness and just does not feel well.  She reports she has had some intermittent chills but has not had fever.  She denies nausea vomiting or diarrhea.  She denies chest pain, palpitations or shortness of breath.  ED Course: In the emergency room she has been given broad-spectrum biotics with vancomycin, clindamycin and ciprofloxacin.  X-ray showed gas gangrene of the right foot but no underlying osteomyelitis on x-ray.  As x-ray is not very reliable for osteomyelitis will obtain MRI for better evaluation strongly suspect osteomyelitis is present.  ER physician discussed with vascular surgery who will see patient in the morning and provide recommendations.  Lab work shows WBC 15,500  hemoglobin 7.3 hematocrit 24.2 0.348 thousand lactic acid 1.5.  Sodium 130 potassium 4.0 chloride 88 bicarb 24 creatinine 12.56 BUN 67 glucose 473 calcium 9.5 alk phos is 101 AST 149 ALT 321 bilirubin 0.8.  COVID is negative.  Hospitalist service asked to admit for further management  Review of Systems:  General: Reports fatigue. Denies fever, night sweats. Denies dizziness. Denies change in appetite HENT: Denies head trauma, headache, denies change in hearing, tinnitus.  Denies nasal congestion or bleeding.  Denies sore throat.  Denies difficulty swallowing Eyes: Denies blurry vision, pain in eye, drainage.  Denies discoloration of eyes. Neck: Denies pain.  Denies swelling.  Denies pain with movement. Cardiovascular: Denies chest pain, palpitations.  Denies edema.  Denies orthopnea Respiratory: Denies shortness of breath, cough.  Denies wheezing.  Denies sputum production Gastrointestinal: Denies abdominal pain, swelling.  Denies nausea, vomiting, diarrhea.  Denies melena.  Denies hematemesis. Musculoskeletal: Reports pain in right foot. Denies limitation of movement.  Denies deformity Genitourinary: Denies pelvic pain.  Denies urinary frequency or hesitancy.  Denies dysuria.  Skin: Has redness of right foot and drainage. Denies rash.  Denies petechiae, purpura, ecchymosis. Neurological:Denies seizure activity. Denies syncope. Denies slurred speech, drooping face.  Denies visual change. Psychiatric: Denies depression, anxiety.  Denies hallucinations.  Past Medical History:  Diagnosis Date   Anemia    Anxiety    Blind right eye 2008   Diabetes mellitus without complication ([place])  Dialysis patient [facility])    ESRD (end stage renal disease) ([place])    Dialysis T/Th/Sa    Past Surgical History:  Procedure Laterality Date   AMPUTATION TOE Left    AV FISTULA PLACEMENT     CESAREAN SECTION  2011   FISTULA SUPERFICIALIZATION Right 29/93/7169   Procedure: PLICATION OF  ARTERIOVENOUS FISTULA  ANEURYSM RIGHT ARM;  Surgeon: Angelia Mould, MD;  Location: [place];  Service: Vascular;  Laterality: Right;   INSERTION OF DIALYSIS CATHETER Left 07/27/2019   Procedure: INSERTION OF TUNNELED  DIALYSIS CATHETER;  Surgeon: Angelia Mould, MD;  Location: [place];  Service: Vascular;  Laterality: Left;    Social History  reports that she has never smoked. She has never used smokeless tobacco. She reports that she does not drink alcohol and does not use drugs.  Allergies  Allergen Reactions   Morphine Rash and Other (See Comments)   Peanut-Containing Drug Products Anaphylaxis and Hives   Penicillins Anaphylaxis   Chocolate Hives    Family History  Problem Relation Age of Onset   Diabetes Mother    Diabetes Father      Prior to Admission medications   Medication Sig Start Date End Date Taking? Authorizing Provider  Accu-Chek FastClix Lancets MISC Use as instructed to check blood sugar up to TID. E11.22 04/15/19   Charlott Rakes, MD  Biotin w/ Vitamins C & E (HAIR/SKIN/NAILS PO) Take 1 capsule by mouth daily.    [provider]  Blood Glucose Monitoring Suppl (ACCU-CHEK GUIDE ME) w/Device KIT 1 kit by Does not apply route 3 (three) times daily. Use to check BG at home up to 3 times daily. E11.22 04/15/19   Charlott Rakes, MD  calcitRIOL (ROCALTROL) 0.25 MCG capsule Take 0.25 mcg by mouth 2 (two) times daily.     [provider]  cinacalcet (SENSIPAR) 90 MG tablet Take 90 mg by mouth daily.  09/22/19   [provider]  Continuous Blood Gluc Receiver (DEXCOM G6 RECEIVER) DEVI 1 Device by Does not apply route as directed. 09/12/20   Shamleffer, Melanie Crazier, MD  Continuous Blood Gluc Sensor (DEXCOM G6 SENSOR) MISC 1 Device by Does not apply route as directed. 09/12/20   Shamleffer, Melanie Crazier, MD  Continuous Blood Gluc Transmit (DEXCOM G6 TRANSMITTER) MISC 1 Device by Does not apply route as directed. 09/12/20   Shamleffer, Melanie Crazier, MD   FOSRENOL 1000 MG PACK Take 2,000 mg by mouth See admin instructions. Take 2000 mg ( 2 packs) by mouth with meals three times daily and 1 pack (1013m) twice daily with a snack 12/25/18   [provider]  glucose blood (ACCU-CHEK GUIDE) test strip Use as instructed to check blood sugar up to TID. E11.22 07/17/19   Fulp, Cammie, MD  insulin aspart (NOVOLOG FLEXPEN) 100 UNIT/ML FlexPen Inject 5 Units into the skin 3 (three) times daily with meals. Max daily 40 units with correction scale 09/13/20   Shamleffer, IMelanie Crazier MD  insulin glargine (LANTUS SOLOSTAR) 100 UNIT/ML Solostar Pen Inject 20 Units into the skin daily. 09/13/20   Shamleffer, IMelanie Crazier MD  Insulin Pen Needle 32G X 4 MM MISC 1 Device by Does not apply route as directed. 05/13/20   Shamleffer, IMelanie Crazier MD  lidocaine-prilocaine (EMLA) cream Apply 1 application topically as directed.  09/07/19   [provider]  losartan (COZAAR) 50 MG tablet Take 1 tablet (50 mg total) by mouth daily. 07/02/20   Pokhrel, LCorrie Mckusick MD  multivitamin (RENA-VIT)  TABS tablet Take 1 tablet by mouth daily. 07/18/19   Fulp, Cammie, MD  sevelamer carbonate (RENVELA) 0.8 g PACK packet Take 0.8 g by mouth 3 (three) times daily with meals.    [provider]    Physical Exam: Vitals:   08/10/21 2345 08/11/21 0030 08/11/21 0115 08/11/21 0200  BP: (!) 199/102 (!) 184/75 (!) 175/75 (!) 177/80  Pulse: 99 96 82 88  Resp: (!) _0 Temp:      TempSrc:      SpO2: 99% 99% 100% 100%  Weight:      Height:        Constitutional: NAD, calm, comfortable Vitals:   08/10/21 2345 08/11/21 0030 08/11/21 0115 08/11/21 0200  BP: (!) 199/102 (!) 184/75 (!) 175/75 (!) 177/80  Pulse: 99 96 82 88  Resp: (!) _1 Temp:      TempSrc:      SpO2: 99% 99% 100% 100%  Weight:      Height:       General: WDWN, Alert and oriented x3.  Eyes: PERRL, conjunctivae normal.  Sclera nonicteric HENT:  [place]/AT, external ears normal.   Nares patent without epistasis  Neck: Soft, normal range of motion, supple, no masses, Trachea midline Respiratory: clear to auscultation bilaterally, no wheezing, no crackles. Normal respiratory effort. No accessory muscle use.  Cardiovascular: Regular rate and rhythm, no murmurs / rubs / gallops. No extremity edema.  Abdomen: Soft, no tenderness, nondistended, no rebound or guarding. Bowel sounds normoactive Musculoskeletal: FROM. no cyanosis. Normal muscle tone.  Right foot with dry peeling skin of distal medial foot and 1st toe. Skin maceration and wound between first and second toe with foul smelling drainage. Medial distal foot swollen with tenderness to palpation AV Fistula in right upper arm. Skin: Warm, dry, intact no rashes, no purpura Neurologic: Normal speech.  Moves extremities spontaneously.  Withdraws from painful stimuli.  Strength equal. Psychiatric: Poor insight. Flat affect and depressed mood.    Labs on Admission: I have personally reviewed following labs and imaging studies  CBC: Recent Labs  Lab 08/10/21 2212  WBC 15.5*  NEUTROABS 13.4*  HGB 7.3*  HCT 24.2*  MCV 93.4  PLT 270    Basic Metabolic Panel: Recent Labs  Lab 08/10/21 2212  NA 130*  K 4.0  CL 88*  CO2 24  GLUCOSE 473*  BUN 67*  CREATININE 12.56*  CALCIUM 9.5    GFR: Estimated Creatinine Clearance: 5.5 mL/min (A) (by C-G formula based on SCr of 12.56 mg/dL (H)).  Liver Function Tests: Recent Labs  Lab 08/10/21 2212  AST 149*  ALT 321*  ALKPHOS 101  BILITOT 0.8  PROT 8.3*  ALBUMIN 2.4*    Urine analysis:    Component Value Date/Time   COLORURINE YELLOW 02/21/2019 1955   APPEARANCEUR TURBID (A) 02/21/2019 1955   LABSPEC 1.012 02/21/2019 1955   PHURINE 7.0 02/21/2019 1955   GLUCOSEU >=500 (A) 02/21/2019 1955   HGBUR MODERATE (A) 02/21/2019 1955   BILIRUBINUR NEGATIVE 02/21/2019 [place] NEGATIVE 02/21/2019 1955   PROTEINUR 100 (A) 02/21/2019 1955   NITRITE NEGATIVE  02/21/2019 1955   LEUKOCYTESUR MODERATE (A) 02/21/2019 1955    Radiological Exams on Admission: DG Chest 1 View  Result Date: 08/10/2021 CLINICAL DATA:  Shortness of breath with generalized weakness and fatigue today. EXAM: CHEST  1 VIEW COMPARISON:  06/30/2020 FINDINGS: Heart size and pulmonary vascularity are normal. Lungs are clear. No pleural effusions. No  pneumothorax. Mediastinal contours appear intact. IMPRESSION: No active disease. Electronically Signed   By: Lucienne Capers M.D.   On: 08/10/2021 23:23   DG Foot 2 Views Right  Result Date: 08/10/2021 CLINICAL DATA:  Pain. Missed dialysis today. Shortness of breath with generalized weakness and fatigue. EXAM: RIGHT FOOT - 2 VIEW COMPARISON:  None. FINDINGS: Soft tissue swelling and soft tissue gas around the medial right forefoot, involving the right first toe and dorsal soft tissues. This is consistent with gas gangrene. No definite bone erosion to suggest osteomyelitis. No acute fracture or dislocation. Extensive vascular calcifications. IMPRESSION: Soft tissue swelling and soft tissue gas over the right first toe and dorsal forefoot consistent with gas gangrene. No radiographic evidence of osteomyelitis. Electronically Signed   By: Lucienne Capers M.D.   On: 08/10/2021 23:12    EKG: Independently reviewed.  EKG shows normal sinus rhythm with mild ST depression but no acute ST elevation or STEMI.  QTc 479  Assessment/Plan Principal Problem:   Gangrene of right foot  Ms. Irmen is admitted to [place] floor.  Started on Vancomycin, clindamycin and cipro for broad spectrum antibiotic coverage for gas gangrene in right foot. Will work to deescalate antibiotics when able to after MRI and surgical evaluation/recommendations.  Obtain MRI foot to make sure no osteomyelitis present.  Pain control with fentanyl overnight.  Pt has required high dose of fentanyl in ER.  Consult wound care in am  Active Problems:   ESRD on hemodialysis   Consult Nephrology in the am for HD. Pt missed her session yesterday she states.  Importance of compliance discussed with pt.     Uncontrolled type 2 diabetes mellitus with hyperglycemia  Lantus daily. Monitor sugars with meals and bedtime.  Corrective insulin provided as needed with excellent control. Check hemoglobin A1c Importance of compliance with diabetic treatment is stressed to the patient    Anemia of chronic disease Monitor CBC with labs in am  DVT prophylaxis: Heparin for DVT prophylaxis.   Code Status:   Full Code  Family Communication:  Diagnosis and plan discussed with patient.  She verbalizes understanding.  Further recommendations to follow as clinical indicated Disposition Plan:   Patient is from:  Home  Anticipated DC to:  Home  Anticipated DC date:  Anticipate 2 midnight or more stay in hospital  Consults called:  Vascular surgery consulted by ER physician and will see pt in am.  Nephrology consulted for HD  Admission status:  Inpatient   Yevonne Aline Karington Zarazua MD [facility]  How to contact the [facility] Attending or Consulting provider [place] or covering provider during after hours [place], for this patient?   Check the care team in [facility] and look for a) attending/consulting [facility] provider listed and b) the [facility] team listed Log into [email or web address] and use [place]'s universal password to access. If you do not have the password, please contact the hospital operator. Locate the [facility] provider you are looking for under [facility] and page to a number that you can be directly reached. If you still have difficulty reaching the provider, please page the [facility] (Director on Call) for the Hospitalists listed on amion for assistance.  08/11/2021, 2:28 AM

## 2021-08-11 NOTE — ED Notes (Signed)
Admit provider at bedside 

## 2021-08-11 NOTE — Anesthesia Preprocedure Evaluation (Signed)
Anesthesia Evaluation  Patient identified by MRN, date of birth, ID band Patient awake    Reviewed: Allergy & Precautions, NPO status , Patient's Chart, lab work & pertinent test results  Airway Mallampati: II  TM Distance: >3 FB Neck ROM: Full    Dental no notable dental hx.    Pulmonary neg pulmonary ROS,    Pulmonary exam normal breath sounds clear to auscultation       Cardiovascular hypertension, + Peripheral Vascular Disease  Normal cardiovascular exam Rhythm:Regular Rate:Normal     Neuro/Psych negative neurological ROS  negative psych ROS   GI/Hepatic negative GI ROS, Neg liver ROS,   Endo/Other  diabetes, Poorly Controlled, Insulin Dependent  Renal/GU DialysisRenal diseaseT,Th, Saturday dialysis  negative genitourinary   Musculoskeletal negative musculoskeletal ROS (+)   Abdominal   Peds negative pediatric ROS (+)  Hematology  (+) anemia ,   Anesthesia Other Findings   Reproductive/Obstetrics negative OB ROS                             Anesthesia Physical Anesthesia Plan  ASA: 4  Anesthesia Plan: General   Post-op Pain Management:    Induction: Intravenous  PONV Risk Score and Plan: 3 and Ondansetron and Treatment may vary due to age or medical condition  Airway Management Planned: LMA  Additional Equipment:   Intra-op Plan:   Post-operative Plan: Extubation in OR  Informed Consent: I have reviewed the patients History and Physical, chart, labs and discussed the procedure including the risks, benefits and alternatives for the proposed anesthesia with the patient or authorized representative who has indicated his/her understanding and acceptance.     Dental advisory given  Plan Discussed with: CRNA and Surgeon  Anesthesia Plan Comments:         Anesthesia Quick Evaluation

## 2021-08-11 NOTE — Consult Note (Signed)
[facility] Nurse Consult Note: Patient receiving care in [place] Reason for Consult: Right foot gangrene Wound type: Right great toe is completely black. Vascular is involved and plans for metatarsal amputation or RBKA. Awaiting MRI This is out of the scope for the [facility] nurses. We will sign off but are available to this patient and medical team if needed  Please re-consult if needed.  Cathlean Marseilles Tamala Julian, MSN, RN, [place], [place]le Pearl, [facility] Wound Treatment Associate Pager [phone]

## 2021-08-11 NOTE — Consult Note (Signed)
[facility] Renal Consultation Note    Indication for Consultation:  Management of ESRD/hemodialysis, anemia, hypertension/volume, and secondary hyperparathyroidism.  HPI: [name] is a 40 y.o. female with past medical history including ESRD on dialysis, type 2 diabetes mellitus, anemia, anxiety, and amputation of left toe, who presented to the ED with pain in her foot.  She reports pain is worsening over the past several days and is now 9 out of 10.  She also had high blood sugars on presentation and reported she had not taken her insulin in over a month.  Reported generalized weakness and fatigue.  Also reported intermittent chills, no fever, nausea, vomiting, or diarrhea.  She denies any chest pain, palpitations, dizziness, or shortness of breath.  Vascular was consulted in the ED and recommended arteriogram with amputation to follow.  Labs notable for glucose 522, NA 129, K4.3, BUN 71, creatinine 12.7, white blood cell 15.0, hemoglobin 7.2, platelets 291.  Nephrology has been consulted for management of ESRD.  Patient reports her last dialysis session was Tuesday and she missed dialysis Thursday due to not feeling well.  At time of our exam, patient is getting ready to go to Cath Lab for arteriogram.  Past Medical History:  Diagnosis Date   Anemia    Anxiety    Blind right eye 2008   Diabetes mellitus without complication ([place])    Dialysis patient ([place])    ESRD (end stage renal disease) ([place])    Dialysis T/Th/Sa   Past Surgical History:  Procedure Laterality Date   AMPUTATION TOE Left    AV FISTULA PLACEMENT     CESAREAN SECTION  2011   FISTULA SUPERFICIALIZATION Right 72/62/0355   Procedure: 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;  Surgeon: Angelia Mould, MD;  Location: [place];  Service: Vascular;  Laterality: Right;   INSERTION OF DIALYSIS CATHETER Left 07/27/2019   Procedure: INSERTION OF TUNNELED  DIALYSIS CATHETER;  Surgeon: Angelia Mould, MD;  Location: [facility] [place] [facility];  Service: Vascular;  Laterality: Left;   Family History  Problem Relation Age of Onset   Diabetes Mother    Diabetes Father    Social History:  reports that she has never smoked. She has never used smokeless tobacco. She reports that she does not drink alcohol and does not use drugs.  ROS: As per HPI otherwise negative.  Physical Exam: Vitals:   08/11/21 0445 08/11/21 0500 08/11/21 0600 08/11/21 0700  BP: (!) 181/82 (!) 177/75 (!) 155/68 (!) 155/72  Pulse: 81 80 79 79  Resp: '17 16 10 15  ' Temp:      TempSrc:      SpO2: 100% 100% 100% 100%  Weight:      Height:         General: Well developed, well nourished, in no acute distress. Head: Normocephalic, atraumatic, sclera non-icteric, mucus membranes are moist. Neck: JVD not elevated. Lungs: Clear bilaterally to auscultation without wheezes, rales, or rhonchi. Breathing is unlabored on RA Heart: RRR with normal S1, S2. No murmurs, rubs, or gallops appreciated. Abdomen: Soft, non-tender, non-distended with normoactive bowel sounds. No rebound/guarding. No obvious abdominal masses. Musculoskeletal:  Strength and tone appear normal for age. Lower extremities: + gangrenous wound R great toe, trace edema RLE, no edema LLE Neuro: Alert and oriented X 3. Moves all extremities spontaneously. Psych:  Responds to questions appropriately with a normal affect. Dialysis Access: RUE AVF + t/b  Allergies  Allergen Reactions   Morphine Rash and Other (  See Comments)   Peanut-Containing Drug Products Anaphylaxis and Hives   Penicillins Anaphylaxis   Chocolate Hives   Prior to Admission medications   Medication Sig Start Date End Date Taking? Authorizing Provider  calcitRIOL (ROCALTROL) 0.25 MCG capsule Take 0.25 mcg by mouth 2 (two) times daily.    Yes [provider]  cinacalcet (SENSIPAR) 90 MG tablet Take 90 mg by mouth daily.  09/22/19  Yes [provider]  Accu-Chek FastClix Lancets  MISC Use as instructed to check blood sugar up to TID. E11.22 Patient taking differently: 1 each by Other route See admin instructions. Use as instructed to check blood sugar up to TID. E11.22 04/15/19   Charlott Rakes, MD  Biotin w/ Vitamins C & E (HAIR/SKIN/NAILS PO) Take 1 capsule by mouth daily.    [provider]  Blood Glucose Monitoring Suppl (ACCU-CHEK GUIDE ME) w/Device KIT 1 kit by Does not apply route 3 (three) times daily. Use to check BG at home up to 3 times daily. E11.22 04/15/19   Charlott Rakes, MD  Continuous Blood Gluc Receiver (DEXCOM G6 RECEIVER) DEVI 1 Device by Does not apply route as directed. 09/12/20   Shamleffer, Melanie Crazier, MD  Continuous Blood Gluc Sensor (DEXCOM G6 SENSOR) MISC 1 Device by Does not apply route as directed. 09/12/20   Shamleffer, Melanie Crazier, MD  Continuous Blood Gluc Transmit (DEXCOM G6 TRANSMITTER) MISC 1 Device by Does not apply route as directed. 09/12/20   Shamleffer, Melanie Crazier, MD  dorzolamide-timolol (COSOPT) 22.3-6.8 MG/ML ophthalmic solution Place 1 drop into the left eye 2 (two) times daily. 03/30/21   [provider]  FOSRENOL 1000 MG PACK Take 2,000 mg by mouth See admin instructions. Take 2000 mg ( 2 packs) by mouth with meals three times daily and 1 pack (1045m) twice daily with a snack 12/25/18   [provider]  glucose blood (ACCU-CHEK GUIDE) test strip Use as instructed to check blood sugar up to TID. E11.22 Patient taking differently: 1 each by Other route See admin instructions. Use as instructed to check blood sugar up to TID. E11.22 07/17/19   Fulp, Cammie, MD  insulin aspart (NOVOLOG FLEXPEN) 100 UNIT/ML FlexPen Inject 5 Units into the skin 3 (three) times daily with meals. Max daily 40 units with correction scale 09/13/20   Shamleffer, IMelanie Crazier MD  insulin glargine (LANTUS SOLOSTAR) 100 UNIT/ML Solostar Pen Inject 20 Units into the skin daily. 09/13/20   Shamleffer, IMelanie Crazier MD  Insulin  Pen Needle 32G X 4 MM MISC 1 Device by Does not apply route as directed. 05/13/20   Shamleffer, IMelanie Crazier MD  lidocaine-prilocaine (EMLA) cream Apply 1 application topically as directed.  09/07/19   [provider]  losartan (COZAAR) 50 MG tablet Take 1 tablet (50 mg total) by mouth daily. 07/02/20   Pokhrel, LCorrie Mckusick MD  multivitamin (RENA-VIT) TABS tablet Take 1 tablet by mouth daily. 07/18/19   Fulp, Cammie, MD  sevelamer carbonate (RENVELA) 0.8 g PACK packet Take 0.8 g by mouth 3 (three) times daily with meals.    [provider]   Current Facility-Administered Medications  Medication Dose Route Frequency Provider Last Rate Last Admin   [MAR Hold] 0.9 %  sodium chloride infusion  250 mL Intravenous PRN Chotiner, BYevonne Aline MD       [MAR Hold] acetaminophen (TYLENOL) tablet 650 mg  650 mg Oral Q6H PRN Chotiner, BYevonne Aline MD       Or   [Doug SouHold] acetaminophen (TYLENOL)  suppository 650 mg  650 mg Rectal Q6H PRN Chotiner, Yevonne Aline, MD       [MAR Hold] cinacalcet (SENSIPAR) tablet 90 mg  90 mg Oral Daily Chotiner, Yevonne Aline, MD       [facility] Hold] ciprofloxacin (CIPRO) IVPB 400 mg  400 mg Intravenous Q24H Chotiner, Yevonne Aline, MD       [MAR Hold] clindamycin (CLEOCIN) IVPB 600 mg  600 mg Intravenous Q8H Chotiner, Yevonne Aline, MD   Stopped at 08/11/21 [phone]   fentaNYL (SUBLIMAZE) injection    PRN Serafina Mitchell, MD   50 mcg at 08/11/21 1115   [MAR Hold] heparin injection 5,000 Units  5,000 Units Subcutaneous Q8H Chotiner, Yevonne Aline, MD   5,000 Units at 08/11/21 0516   heparin sodium (porcine) injection    PRN Serafina Mitchell, MD   7,000 Units at 08/11/21 1137   [MAR Hold] insulin aspart (novoLOG) injection 0-6 Units  0-6 Units Subcutaneous TID WC & HS Chotiner, Yevonne Aline, MD   6 Units at 08/11/21 0744   [MAR Hold] insulin glargine-yfgn (SEMGLEE) injection 20 Units  20 Units Subcutaneous Daily Chotiner, Yevonne Aline, MD       lidocaine (PF) (XYLOCAINE) 1 % injection    PRN Serafina Mitchell, MD   15 mL at 08/11/21 1120   [MAR Hold] LORazepam (ATIVAN) injection 1 mg  1 mg Intravenous Once Barb Merino, MD       Doug Sou Hold] losartan (COZAAR) tablet 50 mg  50 mg Oral Daily Chotiner, Yevonne Aline, MD       midazolam (VERSED) injection    PRN Serafina Mitchell, MD   1 mg at 08/11/21 1115   [MAR Hold] multivitamin (RENA-VIT) tablet 1 tablet  1 tablet Oral QHS Chotiner, Yevonne Aline, MD       [MAR Hold] senna-docusate (Senokot-S) tablet 1 tablet  1 tablet Oral QHS PRN Chotiner, Yevonne Aline, MD       [MAR Hold] sevelamer carbonate (RENVELA) packet 0.8 g  0.8 g Oral TID WC Chotiner, Yevonne Aline, MD       [facility] Hold] sodium chloride flush (NS) 0.9 % injection 3 mL  3 mL Intravenous Q12H Chotiner, Yevonne Aline, MD       [facility] Hold] sodium chloride flush (NS) 0.9 % injection 3 mL  3 mL Intravenous PRN Chotiner, Yevonne Aline, MD       [MAR Hold] vancomycin (VANCOREADY) IVPB 750 mg/150 mL  750 mg Intravenous Q T,Th,Sa-HD Chotiner, Yevonne Aline, MD       Labs: Basic Metabolic Panel: Recent Labs  Lab 08/10/21 2212 08/11/21 0415  NA 130* 129*  K 4.0 4.3  CL 88* 89*  CO2 24 22  GLUCOSE 473* 522*  BUN 67* 71*  CREATININE 12.56* 12.70*  CALCIUM 9.5 9.0   Liver Function Tests: Recent Labs  Lab 08/10/21 2212  AST 149*  ALT 321*  ALKPHOS 101  BILITOT 0.8  PROT 8.3*  ALBUMIN 2.4*   No results for input(s): LIPASE, AMYLASE in the last 168 hours. No results for input(s): AMMONIA in the last 168 hours. CBC: Recent Labs  Lab 08/10/21 2212 08/11/21 0415  WBC 15.5* 15.0*  NEUTROABS 13.4*  --   HGB 7.3* 7.2*  HCT 24.2* 23.4*  MCV 93.4 94.7  PLT 348 291   Cardiac Enzymes: No results for input(s): CKTOTAL, CKMB, CKMBINDEX, TROPONINI in the last 168 hours. CBG: Recent Labs  Lab 08/10/21 2240 08/11/21 0736  GLUCAP 432* 405*   Iron  Studies: No results for input(s): IRON, TIBC, TRANSFERRIN, FERRITIN in the last 72 hours. Studies/Results: DG Chest 1 View  Result Date: 08/10/2021 CLINICAL  DATA:  Shortness of breath with generalized weakness and fatigue today. EXAM: CHEST  1 VIEW COMPARISON:  06/30/2020 FINDINGS: Heart size and pulmonary vascularity are normal. Lungs are clear. No pleural effusions. No pneumothorax. Mediastinal contours appear intact. IMPRESSION: No active disease. Electronically Signed   By: Lucienne Capers M.D.   On: 08/10/2021 23:23   DG Foot 2 Views Right  Result Date: 08/10/2021 CLINICAL DATA:  Pain. Missed dialysis today. Shortness of breath with generalized weakness and fatigue. EXAM: RIGHT FOOT - 2 VIEW COMPARISON:  None. FINDINGS: Soft tissue swelling and soft tissue gas around the medial right forefoot, involving the right first toe and dorsal soft tissues. This is consistent with gas gangrene. No definite bone erosion to suggest osteomyelitis. No acute fracture or dislocation. Extensive vascular calcifications. IMPRESSION: Soft tissue swelling and soft tissue gas over the right first toe and dorsal forefoot consistent with gas gangrene. No radiographic evidence of osteomyelitis. Electronically Signed   By: Lucienne Capers M.D.   On: 08/10/2021 23:12    Dialysis Orders:  Center: [facility]  on TTS . 180NRe, 3 hr 45 min, BFR 400, DFR A1.5, EDW 68.2kg, 2K, 2 Ca, AVF 15g, heparin 4000 unit bolus Mircera 239mg IV q 2 weeks- last dose Hectorol 9 mcg IV q HD Amlodipine 144m1 tab PO QD Renvela 2.4g powder,1 packet per meal Sensipar 902m tabs po QHS  Assessment/Plan:  R great toe diabetic infection: Per VVS, planned for arteriogram today and amputation to follow. On empiric IV antibiotics.   ESRD:  TTS schedule, missed Thursday, Cr is elevated but K+ controlled and not in any respiratory distress. Will plan for next HD tomorrow due to procedure today. Treatment will be abbreviated due to high census/staffing issues  Hypertension/volume: BP moderately elevated. Weight is 3.8kg above EDW but no respiratory distress. Will titrate down volume with HD as tolerated.    Anemia: Hgb 7.2, outpatient HD notes reveal pt had noticed red blood in her stools and had been referred to GI outpatient. If hgb drops further, consider GI consult while admitted  Metabolic bone disease: Calcium is elevated. On high dose hectorol and sensipar, however not sure if she has been taking her sensipar as ordered. Will restart sensipar here, continue VDRA for now and follow calcium closely.   Nutrition:  Currently NPO for procedure T2DM: hyperglycemic, has not been taking her insulin at home. Management per primary team.   SamAnice PaganiniA-C 08/11/2021, 11:55 AM  [facility] Pager: [phone]

## 2021-08-11 NOTE — ED Notes (Signed)
Pt calling out and requesting pain meds. Advised pt this was the last dose ordered for her at this time. Pt verbalized understanding

## 2021-08-11 NOTE — ED Notes (Signed)
Secure chat Barb Merino MD about pt CBG (see chart).

## 2021-08-11 NOTE — ED Notes (Signed)
Pt did not eat food from tray. Discarded in trash. Informed pt game plan to go to OR.

## 2021-08-11 NOTE — Op Note (Signed)
° ° °  Patient name: [name] MRN: [id number] DOB: May 28, 1981 Sex: female  08/11/2021 Pre-operative Diagnosis: Severe right foot infection Post-operative diagnosis:  Same Surgeon:  [name] Procedure:   Open right transmetatarsal amputation Anesthesia: General Blood Loss: Minimal Specimens: Forefoot was sent to pathology as a specimen.  Anaerobic and aerobic cultures were sent  Findings: The patient had fairly good bleeding after amputation and the tissue appeared healthy.  The great toe and second toe were necrotic.  There was a deep space abscess across the forefoot which necessitated transmetatarsal amputation  Indications: This is a 40 year old female with end-stage renal disease who presented to the emergency department this morning with pain in her right great toe which has been going on for a few weeks.  On examination the toe was necrotic and she had drainage across the dorsum of the foot.  I took her for angiography.  She had inline flow via the anterior tibial artery across the ankle.  I tried to recanalize her posterior tibial artery but was unsuccessful.  She comes in today for source control of her infection.  I discussed with the patient and her husband that this may ultimately end up as a transmetatarsal amputation or possibly a more proximal amputation.  Procedure:  The patient was identified in the holding area and taken to [facility] [address]  The patient was then placed supine on the table. general anesthesia was administered.  The patient was prepped and draped in the usual sterile fashion.  A time out was called and antibiotics were administered.  I initially began by making a racquet type incision incorporating the necrotic tissue surrounding the clearly nonviable right great toe.  A 10 blade was used to make the incision and carried down to the bone.  The bone was transected at the distal metatarsal level.  It was sent as a specimen.  I then explored the dorsum of the foot and  entered a large deep space abscess which tracked across the entirety of the forefoot.  At this point I felt that her only attempt at salvaging the foot would be a transmetatarsal amputation.  I created a fishmouth type excision with a posterior flap.  Cultures of the purulent drainage were sent.  The anterior portion of the flap was taken back to tissue that appeared to potentially be viable.  I then used a saw to transect the metatarsal bones.  I irrigated the wound and debrided all necrotic tissue.  There was actually good bleeding from the remaining tissue.  All infection appeared to be removed.  I did not think that the wound could be closed.  I irrigated the wound.  I packed it with Betadine soaked gauze and wrapped it with a Kerlix and Ace wrap.   Disposition: To PACU stable   V. [name], M.D., [facility] [place] Office: [phone] Pager:  [phone]

## 2021-08-11 NOTE — Transfer of Care (Signed)
Immediate Anesthesia Transfer of Care Note  Patient: [name]  Procedure(s) Performed: TRANSMETATARSAL AMPUTATION OF RIGHT FOOT (Right: Foot)  Patient Location: PACU  Anesthesia Type:General  Level of Consciousness: drowsy  Airway & Oxygen Therapy: Patient Spontanous Breathing and Patient connected to nasal cannula oxygen  Post-op Assessment: Report given to RN and Post -op Vital signs reviewed and stable  Post vital signs: Reviewed and stable  Last Vitals:  Vitals Value Taken Time  BP 126/70 08/11/21 1740  Temp 36.3 C 08/11/21 1740  Pulse 74 08/11/21 1743  Resp 13 08/11/21 1743  SpO2 100 % 08/11/21 1743  Vitals shown include unvalidated device data.  Last Pain:  Vitals:   08/11/21 1246  TempSrc:   PainSc: 0-No pain         Complications: No notable events documented.

## 2021-08-11 NOTE — Interval H&P Note (Signed)
History and Physical Interval Note:  08/11/2021 3:53 PM  [name]  has presented today for surgery, with the diagnosis of right infected foot.  The various methods of treatment have been discussed with the patient and family. After consideration of risks, benefits and other options for treatment, the patient has consented to  Procedure(s): AMPUTATION FOOT (Right) as a surgical intervention.  The patient's history has been reviewed, patient examined, no change in status, stable for surgery.  I have reviewed the patient's chart and labs.  Questions were answered to the patient's satisfaction.     [name]

## 2021-08-11 NOTE — ED Notes (Signed)
Repositioned pt in bed. Pt is eating breakfast.

## 2021-08-11 NOTE — Progress Notes (Signed)
Pharmacy Antibiotic Note  [name] is a 40 y.o. female with ESRD on HD admitted on 08/10/2021 with RLE cellulitis/gangrene.  Pharmacy has been consulted for Vancomycin  dosing.  Vancomycin 1500 mg IV given in ED at  0200  Plan: Vancomycin 750 mg IV after each HD  Height: 5\' 1"  (154.9 cm) Weight: 72 kg (158 lb 11.7 oz) IBW/kg (Calculated) : 47.8  Temp (24hrs), Avg:98.5 F (36.9 C), Min:98.3 F (36.8 C), Max:98.7 F (37.1 C)  Recent Labs  Lab 08/10/21 2212  WBC 15.5*  CREATININE 12.56*  LATICACIDVEN 1.5    Estimated Creatinine Clearance: 5.5 mL/min (A) (by C-G formula based on SCr of 12.56 mg/dL (H)).    Allergies  Allergen Reactions   Morphine Rash and Other (See Comments)   Peanut-Containing Drug Products Anaphylaxis and Hives   Penicillins Anaphylaxis   Chocolate Hives     Caryl Pina 08/11/2021 2:35 AM

## 2021-08-11 NOTE — ED Notes (Signed)
Provider at bedside

## 2021-08-11 NOTE — Op Note (Signed)
Patient name: [name] MRN: [id number] DOB: 1981-01-16 Sex: female  08/11/2021 Pre-operative Diagnosis: Right foot gangrene Post-operative diagnosis:  Same Surgeon:  [name] Procedure Performed:  1.  Ultrasound-guided access, left femoral artery  2.  Abdominal aortogram  3.  Right lower extremity runoff  4.  Failed angioplasty, right posterior tibial artery  5.  Closure device, Celt  6.  Conscious sedation, 61 minutes     Indications: This is a 40 year old female with end-stage renal disease who presented to the emergency department with worsening pain in her right foot.  She has dry gangrene of the foot and is at extremely high risk for transtibial amputation.  We discussed proceeding with angiography to evaluate her blood flow prior to any form of debridement of her foot.  Procedure:  The patient was identified in the holding area and taken to room 8.  The patient was then placed supine on the table and prepped and draped in the usual sterile fashion.  A time out was called.  Conscious sedation was administered with the use of IV fentanyl and Versed under continuous physician and nurse monitoring.  Heart rate, blood pressure, and oxygen saturation were continuously monitored.  Total sedation time was 61 minutes.  Ultrasound was used to evaluate the left common femoral artery.  It was patent .  A digital ultrasound image was acquired.  A micropuncture needle was used to access the left common femoral artery under ultrasound guidance.  An 018 wire was advanced without resistance and a micropuncture sheath was placed.  The 018 wire was removed and a benson wire was placed.  The micropuncture sheath was exchanged for a 5 french sheath.  An omniflush catheter was advanced over the wire to the level of L-1.  An abdominal angiogram was obtained.  Next, using the omniflush catheter and a benson wire, the aortic bifurcation was crossed and the catheter was placed into theright external iliac  artery and right runoff was obtained.  Findings:   Aortogram: Approximate 50% bilateral renal artery stenosis.  The infrarenal abdominal aorta is widely patent.  Bilateral common and external iliac arteries are widely patent.  Right Lower Extremity: Right common femoral profundofemoral artery widely patent without stenosis.  Superficial femoral arteries have a calcified without stenosis.  Popliteal artery is widely patent.  The only vessel to cross the ankle is the anterior tibial artery.  The peroneal artery becomes very diminutive in the lower leg.  The posterior tibial artery is patent down to just above the ankle where it occludes.  Left Lower Extremity: Not evaluated  Intervention: After the above images were acquired the decision made to proceed with intervention.  A 5 French 45 cm sheath was advanced over the bifurcation and positioned in the superficial femoral artery.  The patient was fully heparinized.  Next using a V-18 wire and a 3 x 100 Sterling balloon, I advanced the biliary wire into the posterior tibial artery.  I was able to get the wire down across the ankle however I could not get a 3 mm balloon to track.  I then downsized to a 2 mm Sterling again balloon would not track beyond the ankle.  I then switched out to a Confianza Pro 014 wire and then used a 1 x 20 balloon and again could not get this to track.  Because of the patient's renal disease, and heavy calcification with poor reconstitution of the target I elected to stop.  The left groin was closed with  a Celt device without complication.  Impression:  #1  No significant aortoiliac occlusive disease.  The superficial femoral and popliteal artery are widely patent on the right  #2  The dominant runoff to the foot is via the anterior tibial artery which does contribute to the plantar arch.  The peroneal artery is diminutive down in the lower leg.  The posterior tibial artery occludes above the ankle.  #3  Unsuccessful attempt at  posterior tibial artery recanalization   V. [name], M.D., [facility] [place] Office: [phone] Pager:  [phone]

## 2021-08-11 NOTE — Progress Notes (Signed)
Patient seen and examined.  She was in the emergency room in the morning rounds and was ready to go to Cath Lab for lower extremity angiogram.  Admitted early morning hours by nighttime hospitalist with gangrene of the toe, severe peripheral vascular disease, ESRD on hemodialysis.  Currently on broad-spectrum antibiotics.  Vascular surgery planning for amputation, angiogram to assess vascular supply before determining level of amputation. Nephrology on board for dialysis after procedure.  Hemoglobin 7.2.  Continue monitoring.  Expect blood transfusion with dialysis tomorrow if patient undergoes amputation today.   Same-day admission.  No charge visit.

## 2021-08-11 NOTE — Anesthesia Procedure Notes (Signed)
Procedure Name: LMA Insertion Date/Time: 08/11/2021 4:50 PM Performed by: Minerva Ends, CRNA Pre-anesthesia Checklist: Patient identified, Emergency Drugs available, Suction available and Patient being monitored Patient Re-evaluated:Patient Re-evaluated prior to induction Oxygen Delivery Method: Circle system utilized Preoxygenation: Pre-oxygenation with 100% oxygen Induction Type: IV induction LMA: LMA inserted LMA Size: 4.0 Tube type: Oral Number of attempts: 1 Placement Confirmation: positive ETCO2 and breath sounds checked- equal and bilateral Tube secured with: Tape Dental Injury: Teeth and Oropharynx as per pre-operative assessment

## 2021-08-11 NOTE — Progress Notes (Signed)
Pt arrived to unit from  PACU , pt very sleepy,CCMD called ,CHG given, pt oriented to unit,Will continue to monitor. Left groin incision assess level 0   Phoebe Sharps, RN     08/11/21 1842  Vitals  Temp 97.6 F (36.4 C)  Temp Source Oral  BP 139/70  MAP (mmHg) 90  BP Location Left Arm  BP Method Automatic  Patient Position (if appropriate) Lying  Pulse Rate 72  Pulse Rate Source Monitor  ECG Heart Rate 74  Resp 10  Level of Consciousness  Level of Consciousness Alert  Oxygen Therapy  SpO2 100 %  O2 Device Nasal Cannula  O2 Flow Rate (L/min) 2 L/min  Pain Assessment  Pain Scale 0-10  Pain Score Asleep  MEWS Score  MEWS Temp 0  MEWS Systolic 0  MEWS Pulse 0  MEWS RR 1  MEWS LOC 0  MEWS Score 1  MEWS Score Color Nyoka Cowden

## 2021-08-11 NOTE — Progress Notes (Signed)
Pt receives out-pt HD at [facility] on TTS schedule. Pt arrives at 6:00 for 6:15 chair time.  Will assist as needed.  Melven Sartorius Renal Navigator [phone]

## 2021-08-11 NOTE — ED Notes (Signed)
Pt sleeping decrease in O2 sat placed pt on [place] 4lpm at this time

## 2021-08-11 NOTE — Consult Note (Signed)
[facility]  Patient name: [name] MRN: [id number] DOB: 1981/08/12 Sex: female   REQUESTING PROVIDER:    ER   REASON FOR CONSULT:    Infected right great toe  HISTORY OF PRESENT ILLNESS:   [name] is a 40 y.o. female, who presented to the emergency department on 08/11/2021 with a 1 week history of right foot and toe pain that has progressively gotten worse.  The patient suffers from end-stage renal disease, on dialysis Tuesday Thursday Saturday.  Her last dialysis was Tuesday.  She also suffers from type 2 diabetes complicated by neuropathy.  She is noncompliant with medical treatment.  She is a non-smoker.  PAST MEDICAL HISTORY    Past Medical History:  Diagnosis Date   Anemia    Anxiety    Blind right eye 2008   Diabetes mellitus without complication ([place])    Dialysis patient ([place])    ESRD (end stage renal disease) ([place])    Dialysis T/Th/Sa     FAMILY HISTORY   Family History  Problem Relation Age of Onset   Diabetes Mother    Diabetes Father     SOCIAL HISTORY:   Social History   Socioeconomic History   Marital status: Single    Spouse name: Not on file   Number of children: Not on file   Years of education: Not on file   Highest education level: Not on file  Occupational History   Not on file  Tobacco Use   Smoking status: Never   Smokeless tobacco: Never  Vaping Use   Vaping Use: Never used  Substance and Sexual Activity   Alcohol use: No   Drug use: No   Sexual activity: Not on file  Other Topics Concern   Not on file  Social History Narrative   Not on file   Social Determinants of Health   Financial Resource Strain: Not on file  Food Insecurity: Not on file  Transportation Needs: Not on file  Physical Activity: Not on file  Stress: Not on file  Social Connections: Not on file  Intimate Partner Violence: Not on file    ALLERGIES:    Allergies  Allergen  Reactions   Morphine Rash and Other (See Comments)   Peanut-Containing Drug Products Anaphylaxis and Hives   Penicillins Anaphylaxis   Chocolate Hives    CURRENT MEDICATIONS:    Current Facility-Administered Medications  Medication Dose Route Frequency Provider Last Rate Last Admin   0.9 %  sodium chloride infusion  250 mL Intravenous PRN Chotiner, Yevonne Aline, MD       acetaminophen (TYLENOL) tablet 650 mg  650 mg Oral Q6H PRN Chotiner, Yevonne Aline, MD       Or   acetaminophen (TYLENOL) suppository 650 mg  650 mg Rectal Q6H PRN Chotiner, Yevonne Aline, MD       cinacalcet (SENSIPAR) tablet 90 mg  90 mg Oral Daily Chotiner, Yevonne Aline, MD       ciprofloxacin (CIPRO) IVPB 400 mg  400 mg Intravenous Q24H Chotiner, Yevonne Aline, MD       clindamycin (CLEOCIN) IVPB 600 mg  600 mg Intravenous Q8H Chotiner, Yevonne Aline, MD       heparin injection 5,000 Units  5,000 Units Subcutaneous Q8H Chotiner, Yevonne Aline, MD   5,000 Units at 08/11/21 0516   insulin aspart (novoLOG) injection 0-6 Units  0-6 Units Subcutaneous TID WC & HS Chotiner, Yevonne Aline, MD       insulin  glargine-yfgn (SEMGLEE) injection 20 Units  20 Units Subcutaneous Daily Chotiner, Bradley S, MD      ° losartan (COZAAR) tablet 50 mg  50 mg Oral Daily Chotiner, Bradley S, MD      ° multivitamin (RENA-VIT) tablet 1 tablet  1 tablet Oral QHS Chotiner, Bradley S, MD      ° senna-docusate (Senokot-S) tablet 1 tablet  1 tablet Oral QHS PRN Chotiner, Bradley S, MD      ° sevelamer carbonate (RENVELA) packet 0.8 g  0.8 g Oral TID WC Chotiner, Bradley S, MD      ° sodium chloride flush (NS) 0.9 % injection 3 mL  3 mL Intravenous Q12H Chotiner, Bradley S, MD      ° sodium chloride flush (NS) 0.9 % injection 3 mL  3 mL Intravenous PRN Chotiner, Bradley S, MD      ° [START ON 08/12/2021] vancomycin (VANCOREADY) IVPB 750 mg/150 mL  750 mg Intravenous Q T,Th,Sa-HD Chotiner, Bradley S, MD      ° °Current Outpatient Medications  °Medication Sig Dispense Refill  °  Accu-Chek FastClix Lancets MISC Use as instructed to check blood sugar up to TID. E11.22 102 each 11  ° Biotin w/ Vitamins C & E (HAIR/SKIN/NAILS PO) Take 1 capsule by mouth daily.    ° Blood Glucose Monitoring Suppl (ACCU-CHEK GUIDE ME) w/Device KIT 1 kit by Does not apply route 3 (three) times daily. Use to check BG at home up to 3 times daily. E11.22 1 kit 0  ° calcitRIOL (ROCALTROL) 0.25 MCG capsule Take 0.25 mcg by mouth 2 (two) times daily.     ° cinacalcet (SENSIPAR) 90 MG tablet Take 90 mg by mouth daily.     ° Continuous Blood Gluc Receiver (DEXCOM G6 RECEIVER) DEVI 1 Device by Does not apply route as directed. 1 each 0  ° Continuous Blood Gluc Sensor (DEXCOM G6 SENSOR) MISC 1 Device by Does not apply route as directed. 9 each 3  ° Continuous Blood Gluc Transmit (DEXCOM G6 TRANSMITTER) MISC 1 Device by Does not apply route as directed. 1 each 3  ° FOSRENOL 1000 MG PACK Take 2,000 mg by mouth See admin instructions. Take 2000 mg ( 2 packs) by mouth with meals three times daily and 1 pack (1000mg) twice daily with a snack    ° glucose blood (ACCU-CHEK GUIDE) test strip Use as instructed to check blood sugar up to TID. E11.22 100 each 12  ° insulin aspart (NOVOLOG FLEXPEN) 100 UNIT/ML FlexPen Inject 5 Units into the skin 3 (three) times daily with meals. Max daily 40 units with correction scale 15 mL 11  ° insulin glargine (LANTUS SOLOSTAR) 100 UNIT/ML Solostar Pen Inject 20 Units into the skin daily. 15 mL 11  ° Insulin Pen Needle 32G X 4 MM MISC 1 Device by Does not apply route as directed. 150 each 11  ° lidocaine-prilocaine (EMLA) cream Apply 1 application topically as directed.     ° losartan (COZAAR) 50 MG tablet Take 1 tablet (50 mg total) by mouth daily. 30 tablet 2  ° multivitamin (RENA-VIT) TABS tablet Take 1 tablet by mouth daily. 90 tablet 3  ° sevelamer carbonate (RENVELA) 0.8 g PACK packet Take 0.8 g by mouth 3 (three) times daily with meals.    ° ° °REVIEW OF SYSTEMS:  ° °[X] denotes positive  finding, [ ] denotes negative finding °Cardiac  Comments:  °Chest pain or chest pressure:    °Shortness of breath upon exertion:    °  Short of breath when lying flat:    Irregular heart rhythm:        Vascular    Pain in calf, thigh, or hip brought on by ambulation:    Pain in feet at night that wakes you up from your sleep:     Blood clot in your veins:    Leg swelling:         Pulmonary    Oxygen at home:    Productive cough:     Wheezing:         Neurologic    Sudden weakness in arms or legs:     Sudden numbness in arms or legs:     Sudden onset of difficulty speaking or slurred speech:    Temporary loss of vision in one eye:     Problems with dizziness:         Gastrointestinal    Blood in stool:      Vomited blood:         Genitourinary    Burning when urinating:     Blood in urine:        Psychiatric    Major depression:         Hematologic    Bleeding problems:    Problems with blood clotting too easily:        Skin    Rashes or ulcers: x       Constitutional    Fever or chills:     PHYSICAL EXAM:   Vitals:   08/11/21 0300 08/11/21 0445 08/11/21 0500 08/11/21 0600  BP: (!) 173/73 (!) 181/82 (!) 177/75 (!) 155/68  Pulse: 82 81 80 79  Resp: _0 Temp:      TempSrc:      SpO2: 100% 100% 100% 100%  Weight:      Height:        GENERAL: The patient is a well-nourished female, in no acute distress. The vital signs are documented above. CARDIAC: There is a regular rate and rhythm.  VASCULAR: I could not palpate pedal pulses PULMONARY: Nonlabored respirations ABDOMEN: Soft and non-tender  MUSCULOSKELETAL: There are no major deformities or cyanosis. NEUROLOGIC: No focal weakness or paresthesias are detected. SKIN: See photo belowPSYCHIATRIC: The patient has a normal affect.   STUDIES:   I have reviewed her Xray with the following findings:  Soft tissue swelling and soft tissue gas over the right first toe and dorsal forefoot consistent with  gas gangrene. No radiographic evidence of osteomyelitis.  ASSESSMENT and PLAN   Right great toe diabetic foot infection: I discussed with the patient that she is going to need amputation of the infected/necrotic areas of her foot.  This could potentially the a transmetatarsal amputation or possibly a below-knee amputation.  She does have gas on x-ray and so I think this needs to be done urgently.  Prior to proceeding with amputation, I would like to do an arteriogram to determine the blood flow to her foot.  This was discussed with the patient and she wants to get everything done as soon as possible.  We will proceed  Continue IV antibiotics.  Keep patient n.p.o.   Leia Alf, MD, FACS [facility] [phone] Pager [phone]

## 2021-08-11 NOTE — ED Provider Notes (Signed)
[facility]ERGENCY DEPARTMENT Provider Note   CSN: [id number] Arrival date & time: 08/10/21  2015     History Chief Complaint  Patient presents with   Gen. Weakness / SOB     Missed Dialysis / Hyperglycemia    [name] is a 40 y.o. female.  The history is provided by the patient.  Foot Pain This is a new problem. The current episode started more than 2 days ago. The problem occurs daily. The problem has been gradually worsening. Pertinent negatives include no chest pain, no abdominal pain and no shortness of breath. Exacerbated by: Movement and palpation. The symptoms are relieved by rest. She has tried nothing for the symptoms.  Patient with significant history including diabetes, end-stage renal disease presents with generalized weakness, hyperglycemia and right foot pain over the past week.  Denies trauma.  She reports significant right foot pain and drainage.  No fevers or vomiting.  No chest pain or shortness of breath She has missed recent dialysis sessions due to pain and weakness    Past Medical History:  Diagnosis Date   Anemia    Anxiety    Blind right eye 2008   Diabetes mellitus without complication ([place])    Dialysis patient ([place])    ESRD (end stage renal disease) ([place])    Dialysis T/Th/Sa    Patient Active Problem List   Diagnosis Date Noted   Encephalopathy acute 07/01/2020   Acute encephalopathy 06/30/2020   Hypertensive urgency 06/30/2020   Blindness of right eye 06/30/2020   Type 2 diabetes mellitus with hyperglycemia, with long-term current use of insulin ([place]) 05/13/2020   Type 2 diabetes mellitus with proliferative retinopathy, with long-term current use of insulin ([place]) 05/13/2020   Fever    Hyperglycemia due to type 2 diabetes mellitus ([place]) 02/21/2019   Suspected COVID-19 virus infection 02/21/2019   ESRD on hemodialysis ([place]) 02/21/2019   Anemia of chronic disease 02/11/2019   Diabetes mellitus ([place]) 02/11/2019   HLD  (hyperlipidemia) 02/11/2019   PTSD (post-traumatic stress disorder) 02/11/2019   PVD (peripheral vascular disease) ([place]) 02/11/2019   Retinopathy 02/11/2019   Secondary hyperparathyroidism of renal origin ([place]) 02/11/2019   Cataracts, bilateral 01/05/2015   Hyperphosphatemia 01/05/2015   Status post amputation of toe of right foot ([place]) 01/05/2015    Past Surgical History:  Procedure Laterality Date   AMPUTATION TOE Left    AV FISTULA PLACEMENT     CESAREAN SECTION  2011   FISTULA SUPERFICIALIZATION Right 56/43/3295   Procedure: 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;  Surgeon: Angelia Mould, MD;  Location: [facility];  Service: Vascular;  Laterality: Right;   INSERTION OF DIALYSIS CATHETER Left 07/27/2019   Procedure: INSERTION OF TUNNELED  DIALYSIS CATHETER;  Surgeon: Angelia Mould, MD;  Location: [facility] OR;  Service: Vascular;  Laterality: Left;     OB History   No obstetric history on file.     Family History  Problem Relation Age of Onset   Diabetes Mother    Diabetes Father     Social History   Tobacco Use   Smoking status: Never   Smokeless tobacco: Never  Vaping Use   Vaping Use: Never used  Substance Use Topics   Alcohol use: No   Drug use: No    Home Medications Prior to Admission medications   Medication Sig Start Date End Date Taking? Authorizing Provider  Accu-Chek FastClix Lancets MISC Use as instructed to check blood sugar up to  TID. E11.22 04/15/19   Charlott Rakes, MD  Biotin w/ Vitamins C & E (HAIR/SKIN/NAILS PO) Take 1 capsule by mouth daily.    [provider]  Blood Glucose Monitoring Suppl (ACCU-CHEK GUIDE ME) w/Device KIT 1 kit by Does not apply route 3 (three) times daily. Use to check BG at home up to 3 times daily. E11.22 04/15/19   Charlott Rakes, MD  calcitRIOL (ROCALTROL) 0.25 MCG capsule Take 0.25 mcg by mouth 2 (two) times daily.     [provider]  cinacalcet (SENSIPAR) 90 MG tablet Take 90 mg  by mouth daily.  09/22/19   [provider]  Continuous Blood Gluc Receiver (DEXCOM G6 RECEIVER) DEVI 1 Device by Does not apply route as directed. 09/12/20   Shamleffer, Melanie Crazier, MD  Continuous Blood Gluc Sensor (DEXCOM G6 SENSOR) MISC 1 Device by Does not apply route as directed. 09/12/20   Shamleffer, Melanie Crazier, MD  Continuous Blood Gluc Transmit (DEXCOM G6 TRANSMITTER) MISC 1 Device by Does not apply route as directed. 09/12/20   Shamleffer, Melanie Crazier, MD  FOSRENOL 1000 MG PACK Take 2,000 mg by mouth See admin instructions. Take 2000 mg ( 2 packs) by mouth with meals three times daily and 1 pack (1072m) twice daily with a snack 12/25/18   [provider]  glucose blood (ACCU-CHEK GUIDE) test strip Use as instructed to check blood sugar up to TID. E11.22 07/17/19   Fulp, Cammie, MD  insulin aspart (NOVOLOG FLEXPEN) 100 UNIT/ML FlexPen Inject 5 Units into the skin 3 (three) times daily with meals. Max daily 40 units with correction scale 09/13/20   Shamleffer, IMelanie Crazier MD  insulin glargine (LANTUS SOLOSTAR) 100 UNIT/ML Solostar Pen Inject 20 Units into the skin daily. 09/13/20   Shamleffer, IMelanie Crazier MD  Insulin Pen Needle 32G X 4 MM MISC 1 Device by Does not apply route as directed. 05/13/20   Shamleffer, IMelanie Crazier MD  lidocaine-prilocaine (EMLA) cream Apply 1 application topically as directed.  09/07/19   [provider]  losartan (COZAAR) 50 MG tablet Take 1 tablet (50 mg total) by mouth daily. 07/02/20   Pokhrel, LCorrie Mckusick MD  multivitamin (RENA-VIT) TABS tablet Take 1 tablet by mouth daily. 07/18/19   Fulp, Cammie, MD  sevelamer carbonate (RENVELA) 0.8 g PACK packet Take 0.8 g by mouth 3 (three) times daily with meals.    [provider]    Allergies    Morphine, Peanut-containing drug products, Penicillins, and Chocolate  Review of Systems   Review of Systems  Constitutional:  Positive for fatigue. Negative for fever.   Respiratory:  Negative for shortness of breath.   Cardiovascular:  Negative for chest pain.  Gastrointestinal:  Negative for abdominal pain and vomiting.  Skin:  Positive for color change and wound.  All other systems reviewed and are negative.  Physical Exam Updated Vital Signs BP (!) 184/75    Pulse 96    Temp 98.3 F (36.8 C) (Oral)    Resp 19    Ht 1.549 m ('5\' 1"' )    Wt 72 kg    LMP 07/17/2021    SpO2 99%    BMI 29.99 kg/m   Physical Exam CONSTITUTIONAL: Disheveled, ill-appearing HEAD: Normocephalic/atraumatic EYES: EOMI/PERRL ENMT: Mucous membranes moist NECK: supple no meningeal signs SPINE/BACK:entire spine nontender CV: S1/S2 noted LUNGS: Lungs are clear to auscultation bilaterally, no apparent distress ABDOMEN: soft, nontender NEURO: Pt is awake/alert/appropriate, moves all extremitiesx4.  No facial droop.   EXTREMITIES: pulses normal/equal  in both feet.  Right foot is warm to touch see photo below SKIN: Significant drainage noted to the right foot with crepitus noted.  See photo below PSYCH: Mildly anxious     ED Results / Procedures / Treatments   Labs (all labs ordered are listed, but only abnormal results are displayed) Labs Reviewed  COMPREHENSIVE METABOLIC PANEL - Abnormal; Notable for the following components:      Result Value   Sodium 130 (*)    Chloride 88 (*)    Glucose, Bld 473 (*)    BUN 67 (*)    Creatinine, Ser 12.56 (*)    Total Protein 8.3 (*)    Albumin 2.4 (*)    AST 149 (*)    ALT 321 (*)    GFR, Estimated 4 (*)    Anion gap 18 (*)    All other components within normal limits  CBC WITH DIFFERENTIAL/PLATELET - Abnormal; Notable for the following components:   WBC 15.5 (*)    RBC 2.59 (*)    Hemoglobin 7.3 (*)    HCT 24.2 (*)    nRBC 1.3 (*)    Neutro Abs 13.4 (*)    Abs Immature Granulocytes 0.11 (*)    All other components within normal limits  PROTIME-INR - Abnormal; Notable for the following components:   Prothrombin Time 15.6  (*)    All other components within normal limits  CBG MONITORING, ED - Abnormal; Notable for the following components:   Glucose-Capillary 432 (*)    All other components within normal limits  RESP PANEL BY RT-PCR (FLU A&B, COVID) ARPGX2  CULTURE, BLOOD (ROUTINE X 2)  CULTURE, BLOOD (ROUTINE X 2)  URINE CULTURE  LACTIC ACID, PLASMA  APTT  LACTIC ACID, PLASMA  URINALYSIS, ROUTINE W REFLEX MICROSCOPIC  I-STAT BETA HCG BLOOD, ED (MC, [facility], AP ONLY)  I-STAT BETA HCG BLOOD, ED ([facility], [facility], AP ONLY)  POC SARS CORONAVIRUS 2 AG -  ED    EKG EKG Interpretation  Date/Time:  Thursday August 10 2021 22:44:59 EST Ventricular Rate:  97 PR Interval:  122 QRS Duration: 84 QT Interval:  377 QTC Calculation: 479 R Axis:   62 Text Interpretation: Sinus rhythm Probable anteroseptal infarct, old Minimal ST depression, diffuse leads Confirmed by Ripley Fraise [phone]) on 08/11/2021 12:32:54 AM  Radiology DG Chest 1 View  Result Date: 08/10/2021 CLINICAL DATA:  Shortness of breath with generalized weakness and fatigue today. EXAM: CHEST  1 VIEW COMPARISON:  06/30/2020 FINDINGS: Heart size and pulmonary vascularity are normal. Lungs are clear. No pleural effusions. No pneumothorax. Mediastinal contours appear intact. IMPRESSION: No active disease. Electronically Signed   By: Lucienne Capers M.D.   On: 08/10/2021 23:23   DG Foot 2 Views Right  Result Date: 08/10/2021 CLINICAL DATA:  Pain. Missed dialysis today. Shortness of breath with generalized weakness and fatigue. EXAM: RIGHT FOOT - 2 VIEW COMPARISON:  None. FINDINGS: Soft tissue swelling and soft tissue gas around the medial right forefoot, involving the right first toe and dorsal soft tissues. This is consistent with gas gangrene. No definite bone erosion to suggest osteomyelitis. No acute fracture or dislocation. Extensive vascular calcifications. IMPRESSION: Soft tissue swelling and soft tissue gas over the right first toe and dorsal forefoot  consistent with gas gangrene. No radiographic evidence of osteomyelitis. Electronically Signed   By: Lucienne Capers M.D.   On: 08/10/2021 23:12    Procedures .Critical Care Performed by: Ripley Fraise, MD Authorized by: Ripley Fraise, MD  Critical care provider statement:    Critical care time (minutes):  60   Critical care start time:  08/11/2021 12:34 AM   Critical care end time:  08/11/2021 1:34 AM   Critical care time was exclusive of:  Separately billable procedures and treating other patients   Critical care was necessary to treat or prevent imminent or life-threatening deterioration of the following conditions:  Sepsis   Critical care was time spent personally by me on the following activities:  Development of treatment plan with patient or surrogate, discussions with consultants, review of old charts, re-evaluation of patient's condition, ordering and review of radiographic studies, ordering and review of laboratory studies, pulse oximetry, ordering and performing treatments and interventions, examination of patient and evaluation of patient's response to treatment   I assumed direction of critical care for this patient from another provider in my specialty: no     Care discussed with: admitting provider     Medications Ordered in ED Medications  vancomycin (VANCOREADY) IVPB 1500 mg/300 mL (1,500 mg Intravenous New Bag/Given 08/11/21 0201)  vancomycin (VANCOREADY) IVPB 750 mg/150 mL (has no administration in time range)  fentaNYL (SUBLIMAZE) injection 100 mcg (100 mcg Intravenous Given 08/11/21 0027)  ciprofloxacin (CIPRO) IVPB 400 mg (0 mg Intravenous Stopped 08/11/21 0202)  clindamycin (CLEOCIN) IVPB 600 mg (0 mg Intravenous Stopped 08/11/21 0057)  fentaNYL (SUBLIMAZE) injection 50 mcg (50 mcg Intravenous Given 08/11/21 0138)    ED Course  I have reviewed the triage vital signs and the nursing notes.  Pertinent labs & imaging results that were available during my care  of the patient were reviewed by me and considered in my medical decision making (see chart for details).  Clinical Course as of 08/11/21 6629  Fri Aug 11, 2021  0131 Discussed the case with Dr. Trula Slade with vascular surgery.  Since patient has a palpable pulse, continue IV antibiotics and admit to the hospitalist.  Keep patient n.p.o. [DW]  0306 X-ray imaging reveals gas gangrene, labs reveal severe hyperglycemia, chronic renal failure, leukocytosis [DW]    Clinical Course User Index [DW] Ripley Fraise, MD   MDM Rules/Calculators/A&P                         This patient presents to the ED for concern of right foot pain, this involves an extensive number of treatment options, and is a complaint that carries with it a high risk of complications and morbidity.  The differential diagnosis includes cellulitis, abscess, gangrene   Additional history obtained:   External records from outside source obtained and reviewed including previous records reviewed   Lab Tests:  I Ordered, reviewed, and interpreted labs.  The pertinent results include: Hyperglycemia, leukocytosis, renal failure   Imaging Studies ordered:  I ordered imaging studies including foot x-ray I independently visualized and interpreted imaging which showed gas gangrene I agree with the radiologist interpretation   Cardiac Monitoring:  The patient was maintained on a cardiac monitor.  I personally viewed and interpreted the cardiac monitored which showed an underlying rhythm of: Sinus rhythm   Medicines ordered and prescription drug management:  I ordered medication including vancomycin for gangrenous foot Reevaluation of the patient after these medicines showed that the patient stayed the same I have reviewed the patients home medicines and have made adjustments as needed   Critical Interventions:  IV antibiotics   Consultations Obtained:  I requested consultation with the vascular surgery Dr. Trula Slade  and Dr. Tonie Griffith  and discussed lab and imaging findings as well as pertinent plan - they recommend: Admit to the hospital for likely surgery   Reevaluation:  After the interventions noted above, I reevaluated the patient and found that they have :stayed the same   Dispostion:  After consideration of the diagnostic results and the patients response to treatment feel that the patent would benefit from admission and likely surgical management of her right foot.      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

## 2021-08-12 ENCOUNTER — Encounter (HOSPITAL_COMMUNITY): Payer: Self-pay | Admitting: Surgery

## 2021-08-12 DIAGNOSIS — I96 Gangrene, not elsewhere classified: Secondary | ICD-10-CM | POA: Diagnosis not present

## 2021-08-12 LAB — RENAL FUNCTION PANEL
Albumin: 2 g/dL — ABNORMAL LOW (ref 3.5–5.0)
Anion gap: 18 — ABNORMAL HIGH (ref 5–15)
BUN: 79 mg/dL — ABNORMAL HIGH (ref 6–20)
CO2: 24 mmol/L (ref 22–32)
Calcium: 9.6 mg/dL (ref 8.9–10.3)
Chloride: 90 mmol/L — ABNORMAL LOW (ref 98–111)
Creatinine, Ser: 14.26 mg/dL — ABNORMAL HIGH (ref 0.44–1.00)
GFR, Estimated: 3 mL/min — ABNORMAL LOW (ref >60)
Glucose, Bld: 218 mg/dL — ABNORMAL HIGH (ref 70–99)
Phosphorus: 9.1 mg/dL — ABNORMAL HIGH (ref 2.5–4.6)
Potassium: 4.1 mmol/L (ref 3.5–5.1)
Sodium: 132 mmol/L — ABNORMAL LOW (ref 135–145)

## 2021-08-12 LAB — BLOOD CULTURE ID PANEL (REFLEXED) - BCID2

## 2021-08-12 LAB — GLUCOSE, CAPILLARY
Glucose-Capillary: 240 mg/dL — ABNORMAL HIGH (ref 70–99)
Glucose-Capillary: 219 mg/dL — ABNORMAL HIGH (ref 70–99)
Glucose-Capillary: 129 mg/dL — ABNORMAL HIGH (ref 70–99)

## 2021-08-12 LAB — CBC
HCT: 20.9 % — ABNORMAL LOW (ref 36.0–46.0)
Hemoglobin: 6.6 g/dL — CL (ref 12.0–15.0)
MCH: 28.9 pg (ref 26.0–34.0)
MCHC: 31.6 g/dL (ref 30.0–36.0)
MCV: 91.7 fL (ref 80.0–100.0)
Platelets: 259 10*3/uL (ref 150–400)
RBC: 2.28 MIL/uL — ABNORMAL LOW (ref 3.87–5.11)
RDW: 16 % — ABNORMAL HIGH (ref 11.5–15.5)
WBC: 10.6 10*3/uL — ABNORMAL HIGH (ref 4.0–10.5)
nRBC: 2.3 % — ABNORMAL HIGH (ref 0.0–0.2)

## 2021-08-12 LAB — PREPARE RBC (CROSSMATCH)

## 2021-08-12 LAB — ABO/RH: ABO/RH(D): O POS

## 2021-08-12 MED ORDER — FENTANYL CITRATE PF 50 MCG/ML IJ SOSY
50.0000 ug | PREFILLED_SYRINGE | INTRAMUSCULAR | Status: DC | PRN
  Administered 2021-08-12 (×2): 50 ug via INTRAVENOUS
  Administered 2021-08-12: 25 ug via INTRAVENOUS
  Administered 2021-08-12 – 2021-08-23 (×31): 50 ug via INTRAVENOUS
  Filled 2021-08-12 (×36): qty 1

## 2021-08-12 MED ORDER — OXYCODONE HCL 5 MG PO TABS
5.0000 mg | ORAL_TABLET | ORAL | Status: DC | PRN
  Administered 2021-08-12 – 2021-08-25 (×32): 5 mg via ORAL
  Filled 2021-08-12 (×35): qty 1

## 2021-08-12 MED ORDER — NALOXONE HCL 0.4 MG/ML IJ SOLN: 0.4000 mg | INTRAMUSCULAR | Status: DC | PRN

## 2021-08-12 MED ORDER — SODIUM CHLORIDE 0.9% IV SOLUTION: Freq: Once | INTRAVENOUS | Status: DC

## 2021-08-12 NOTE — Progress Notes (Signed)
Critical lab value: Hgb 6.6  Reported at: 08/12/2021; 1210 MD made aware

## 2021-08-12 NOTE — Progress Notes (Signed)
Orthopedic Tech Progress Note Patient Details:  [name] 1980-10-17 [id number]  Ortho Devices Type of Ortho Device: Darco shoe Ortho Device/Splint Location: Right Foot Ortho Device/Splint Interventions: Application   Post Interventions Patient Tolerated: Well  Ravleen Ries E Cheo Selvey 08/12/2021, 10:14 AM

## 2021-08-12 NOTE — Progress Notes (Addendum)
°  Progress Note    08/12/2021 8:22 AM 1 Day Post-Op  Subjective:  wants pain medication  afebrile  Vitals:   08/12/21 0320 08/12/21 0818  BP: (!) 141/75 (!) 153/83  Pulse: 72   Resp: 13   Temp: (!) 97.5 F (36.4 C) (!) 97.5 F (36.4 C)  SpO2: 100% 100%    Physical Exam: Cardiac:  regular Lungs:  non labored Incisions:  right foot wound clean without evidence of infection. Extremities:  brisk DP doppler signal right foot   CBC    Component Value Date/Time   WBC 15.0 (H) 08/11/2021 0415   RBC 2.47 (L) 08/11/2021 0415   HGB 7.2 (L) 08/11/2021 0415   HGB 10.6 (L) 05/18/2020 0927   HCT 23.4 (L) 08/11/2021 0415   HCT 35.1 05/18/2020 0927   PLT 291 08/11/2021 0415   PLT 186 05/18/2020 0927   MCV 94.7 08/11/2021 0415   MCV 89 05/18/2020 0927   MCH 29.1 08/11/2021 0415   MCHC 30.8 08/11/2021 0415   RDW 15.0 08/11/2021 0415   RDW 13.9 05/18/2020 0927   LYMPHSABS 1.1 08/10/2021 2212   LYMPHSABS 1.6 05/18/2020 0927   MONOABS 0.6 08/10/2021 2212   EOSABS 0.2 08/10/2021 2212   EOSABS 0.6 (H) 05/18/2020 0927   BASOSABS 0.0 08/10/2021 2212   BASOSABS 0.0 05/18/2020 0927    BMET    Component Value Date/Time   NA 129 (L) 08/11/2021 0415   NA 133 (L) 05/18/2020 0927   K 4.3 08/11/2021 0415   CL 89 (L) 08/11/2021 0415   CO2 22 08/11/2021 0415   GLUCOSE 522 (HH) 08/11/2021 0415   BUN 71 (H) 08/11/2021 0415   BUN 45 (H) 05/18/2020 0927   CREATININE 12.70 (H) 08/11/2021 0415   CALCIUM 9.0 08/11/2021 0415   GFRNONAA 3 (L) 08/11/2021 0415   GFRAA 6 (L) 05/18/2020 0927    INR    Component Value Date/Time   INR 1.2 08/10/2021 2212     Intake/Output Summary (Last 24 hours) at 08/12/2021 0388 Last data filed at 08/12/2021 0600 Gross per 24 hour  Intake 344.6 ml  Output 25 ml  Net 319.6 ml     Assessment/Plan:  40 y.o. female is s/p:  Open right TMA  1 Day Post-Op   -TMA site is clean without evidence of infection.  She has a very brisk right DP doppler  signal. -continue wet to dry dressings bid -will order darco shoe for heel weight bearing only -DVT prophylaxis:  sq heparin   [name], PA-C [facility] [phone] 08/12/2021 8:22 AM  I have seen and evaluated the patient. I agree with the PA note as documented above.  Postop day 1 status post open right TMA for severe foot infection.  Dressing changed at bedside and all the tissue appears viable.  Brisk DP signal with Doppler.  We will order wet-to-dry dressing changes twice daily.  Will order Darco shoe.  Marty Heck, MD [facility] Office: [phone]

## 2021-08-12 NOTE — Progress Notes (Signed)
TR[facility] night cross cover note:  I was notified by RN of patient's request for clarification of diet status, with current order for NPO.  Patient is status post right transmetatarsal amputation on 08/11/2021. Per my chart review, including review of most recent rounding hospitalist progress note, there does not appear to be any imminent plans for additional surgical procedures at this time.  Additionally, no overtly identified indication for perpetuation of n.p.o. status, per my review.  I subsequently ordered a renal diet for this patient.     Babs Bertin, DO Hospitalist

## 2021-08-12 NOTE — Progress Notes (Signed)
PHARMACY - PHYSICIAN COMMUNICATION CRITICAL VALUE ALERT - BLOOD CULTURE IDENTIFICATION (BCID)  [name] is an 40 y.o. female who presented to [facility] on 08/10/2021 with a chief complaint of right great toe cellulitis/gangrene s/p amputation  Assessment:   1/2 blood cultures growing MR-Staphylococcus epidermidis, possible contamination  Name of physician (or Provider) Contacted:  Dr. Velia Meyer  Current antibiotics:  Vancomycin Clindmycin Cipro  Changes to prescribed antibiotics recommended:  No changes at this time, F/U wound cultures  Results for orders placed or performed during the hospital encounter of 08/10/21  Blood Culture ID Panel (Reflexed) (Collected: 08/10/2021  9:56 PM)  Result Value Ref Range   Enterococcus faecalis NOT DETECTED NOT DETECTED   Enterococcus Faecium NOT DETECTED NOT DETECTED   Listeria monocytogenes NOT DETECTED NOT DETECTED   Staphylococcus species DETECTED (A) NOT DETECTED   Staphylococcus aureus (BCID) NOT DETECTED NOT DETECTED   Staphylococcus epidermidis DETECTED (A) NOT DETECTED   Staphylococcus lugdunensis NOT DETECTED NOT DETECTED   Streptococcus species NOT DETECTED NOT DETECTED   Streptococcus agalactiae NOT DETECTED NOT DETECTED   Streptococcus pneumoniae NOT DETECTED NOT DETECTED   Streptococcus pyogenes NOT DETECTED NOT DETECTED   A.calcoaceticus-baumannii NOT DETECTED NOT DETECTED   Bacteroides fragilis NOT DETECTED NOT DETECTED   Enterobacterales NOT DETECTED NOT DETECTED   Enterobacter cloacae complex NOT DETECTED NOT DETECTED   Escherichia coli NOT DETECTED NOT DETECTED   Klebsiella aerogenes NOT DETECTED NOT DETECTED   Klebsiella oxytoca NOT DETECTED NOT DETECTED   Klebsiella pneumoniae NOT DETECTED NOT DETECTED   Proteus species NOT DETECTED NOT DETECTED   Salmonella species NOT DETECTED NOT DETECTED   Serratia marcescens NOT DETECTED NOT DETECTED   Haemophilus influenzae NOT DETECTED NOT DETECTED   Neisseria  meningitidis NOT DETECTED NOT DETECTED   Pseudomonas aeruginosa NOT DETECTED NOT DETECTED   Stenotrophomonas maltophilia NOT DETECTED NOT DETECTED   Candida albicans NOT DETECTED NOT DETECTED   Candida auris NOT DETECTED NOT DETECTED   Candida glabrata NOT DETECTED NOT DETECTED   Candida krusei NOT DETECTED NOT DETECTED   Candida parapsilosis NOT DETECTED NOT DETECTED   Candida tropicalis NOT DETECTED NOT DETECTED   Cryptococcus neoformans/gattii NOT DETECTED NOT DETECTED   Methicillin resistance mecA/C DETECTED (A) NOT DETECTED    Caryl Pina 08/12/2021  12:50 AM

## 2021-08-12 NOTE — Progress Notes (Signed)
PROGRESS NOTE    [name]  WUJ:[id number] DOB: March 15, 1981 DOA: 08/10/2021 PCP: [facility]    Brief Narrative:  40 year old with history of type 2 diabetes on insulin, ESRD on hemodialysis TTS schedule, neuropathy, hypertension with poor compliance came to the emergency room with right foot pain and infection.  She was found to have gangrenous toes.  Underwent angiogram and urgent transmetatarsal amputation.   Assessment & Plan:   Principal Problem:   Gangrene of right foot (HCC) Active Problems:   Anemia of chronic disease   ESRD on hemodialysis ([place])   Uncontrolled type 2 diabetes mellitus with hyperglycemia (HCC)  Diabetic foot infection, osteomyelitis and gangrene of the right toes: Blood cultures negative so far.  Probably contaminant epidermidis. Angiogram with poor blood supply below ankles, patient underwent emergent transmetatarsal amputation right with no closure of the skin margins.  Followed by vascular.  Hopefully they can close the wound. Likely patient had amputation to clean margin, surgical cultures are pending.  If amputation to clean margin, she will not need any IV antibiotics after closure of wound.  Adequate pain medications.  start mobility.  Nonweightbearing right toes.  ESRD on hemodialysis. Anemia of chronic disease. Missed HD. Going to HD today  Hb 6.6 mostly chronic, 2 units PRBC today. Has reports of fresh rectal bleeding, none now. Will need GI if recurs.  Type 2 diabetes, poorly controlled with hyperglycemia:  A1c is 10.  Noncompliance with treatment.  Resume insulin, uptitrate to control blood sugars and will change regimen on discharge depending upon blood sugars in the hospital.  DVT prophylaxis: heparin injection 5,000 Units Start: 08/11/21 0600   Code Status: full code  Family Communication: none  Disposition Plan: Status is: Inpatient  Remains inpatient appropriate because: Active IV antibiotics.  Active  treatment with dialysis and surgery.         Consultants:  Vascular surgery Nephrology  Procedures:  Transmetatarsal amputation 12/30  Antimicrobials:  Vancomycin, clindamycin, ciprofloxacin   Subjective: Patient seen and examined.  Mild pain in the leg.  Just received pain medications.  Denies any other complaints.  Looking forward to go to dialysis. Consented for blood transfusion.  Objective: Vitals:   08/11/21 2334 08/12/21 0320 08/12/21 0818 08/12/21 1053  BP: (!) 155/76 (!) 141/75 (!) 153/83 122/64  Pulse: 77 72  70  Resp: 12 13  16   Temp: (!) 97.4 F (36.3 C) (!) 97.5 F (36.4 C) (!) 97.5 F (36.4 C) 97.8 F (36.6 C)  TempSrc: Oral Oral Oral Oral  SpO2: 100% 100% 100% 100%  Weight:      Height:        Intake/Output Summary (Last 24 hours) at 08/12/2021 1436 Last data filed at 08/12/2021 0600 Gross per 24 hour  Intake 344.6 ml  Output 25 ml  Net 319.6 ml   Filed Weights   08/10/21 2041  Weight: 72 kg    Examination:  General exam: Appears calm and comfortable , on room air  Respiratory system: Clear to auscultation. Respiratory effort normal. Cardiovascular system: S1 & S2 heard, RRR.  Gastrointestinal system: soft , NT , BS present  Central nervous system: Alert and oriented. No focal neurological deficits. Extremities: Symmetric 5 x 5 power. Skin:  Right foot with post op dressing . Surgery inspected right foot, open clean wound.    Data Reviewed: I have personally reviewed following labs and imaging studies  CBC: Recent Labs  Lab 08/10/21 2212 08/11/21 0415 08/12/21 1123  WBC 15.5* 15.0* 10.6*  NEUTROABS 13.4*  --   --   HGB 7.3* 7.2* 6.6*  HCT 24.2* 23.4* 20.9*  MCV 93.4 94.7 91.7  PLT 348 291 784   Basic Metabolic Panel: Recent Labs  Lab 08/10/21 2212 08/11/21 0415 08/12/21 1123  NA 130* 129* PENDING  K 4.0 4.3 PENDING  CL 88* 89* PENDING  CO2 24 22 PENDING  GLUCOSE 473* 522* 218*  BUN 67* 71* 79*  CREATININE  12.56* 12.70* 14.26*  CALCIUM 9.5 9.0 9.6  PHOS  --   --  9.1*   GFR: Estimated Creatinine Clearance: 4.8 mL/min (A) (by C-G formula based on SCr of 14.26 mg/dL (H)). Liver Function Tests: Recent Labs  Lab 08/10/21 2212 08/12/21 1123  AST 149*  --   ALT 321*  --   ALKPHOS 101  --   BILITOT 0.8  --   PROT 8.3*  --   ALBUMIN 2.4* 2.0*   No results for input(s): LIPASE, AMYLASE in the last 168 hours. No results for input(s): AMMONIA in the last 168 hours. Coagulation Profile: Recent Labs  Lab 08/10/21 2212  INR 1.2   Cardiac Enzymes: No results for input(s): CKTOTAL, CKMB, CKMBINDEX, TROPONINI in the last 168 hours. BNP (last 3 results) No results for input(s): PROBNP in the last 8760 hours. HbA1C: Recent Labs    08/11/21 0415  HGBA1C 10.4*   CBG: Recent Labs  Lab 08/11/21 1739 08/11/21 1847 08/11/21 2145 08/12/21 0627 08/12/21 1056  GLUCAP 224* 227* 225* 240* 219*   Lipid Profile: No results for input(s): CHOL, HDL, LDLCALC, TRIG, CHOLHDL, LDLDIRECT in the last 72 hours. Thyroid Function Tests: No results for input(s): TSH, T4TOTAL, FREET4, T3FREE, THYROIDAB in the last 72 hours. Anemia Panel: No results for input(s): VITAMINB12, FOLATE, FERRITIN, TIBC, IRON, RETICCTPCT in the last 72 hours. Sepsis Labs: Recent Labs  Lab 08/10/21 2212  LATICACIDVEN 1.5    Recent Results (from the past 240 hour(s))  Blood Culture (routine x 2)     Status: None (Preliminary result)   Collection Time: 08/10/21  9:56 PM   Specimen: BLOOD  Result Value Ref Range Status   Specimen Description BLOOD  Final   Special Requests   Final    BOTTLES DRAWN AEROBIC AND ANAEROBIC Blood Culture adequate volume   Culture  Setup Time   Final    GRAM POSITIVE COCCI AEROBIC BOTTLE ONLY CRITICAL RESULT CALLED TO, READ BACK BY AND VERIFIED WITH: PHARMD GREG ABBOTT 08/12/21@00 :32 BY TW    Culture   Final    GRAM POSITIVE COCCI TOO YOUNG TO READ Performed at [facility],  [place] [address], [place], [place] [address]    Report Status PENDING  Incomplete  Blood Culture ID Panel (Reflexed)     Status: Abnormal   Collection Time: 08/10/21  9:56 PM  Result Value Ref Range Status   Enterococcus faecalis NOT DETECTED NOT DETECTED Final   Enterococcus Faecium NOT DETECTED NOT DETECTED Final   Listeria monocytogenes NOT DETECTED NOT DETECTED Final   Staphylococcus species DETECTED (A) NOT DETECTED Final    Comment: CRITICAL RESULT CALLED TO, READ BACK BY AND VERIFIED WITH: PHARMD GREG ABBOTT 08/12/21@00 :32 BY TW    Staphylococcus aureus (BCID) NOT DETECTED NOT DETECTED Final   Staphylococcus epidermidis DETECTED (A) NOT DETECTED Final    Comment: Methicillin (oxacillin) resistant coagulase negative staphylococcus. Possible blood culture contaminant (unless isolated from more than one blood culture draw or clinical case suggests pathogenicity). No antibiotic treatment  is indicated for blood  culture contaminants. CRITICAL RESULT CALLED TO, READ BACK BY AND VERIFIED WITH: PHARMD GREG ABBOTT 08/12/21@00 :32 BY TW    Staphylococcus lugdunensis NOT DETECTED NOT DETECTED Final   Streptococcus species NOT DETECTED NOT DETECTED Final   Streptococcus agalactiae NOT DETECTED NOT DETECTED Final   Streptococcus pneumoniae NOT DETECTED NOT DETECTED Final   Streptococcus pyogenes NOT DETECTED NOT DETECTED Final   A.calcoaceticus-baumannii NOT DETECTED NOT DETECTED Final   Bacteroides fragilis NOT DETECTED NOT DETECTED Final   Enterobacterales NOT DETECTED NOT DETECTED Final   Enterobacter cloacae complex NOT DETECTED NOT DETECTED Final   Escherichia coli NOT DETECTED NOT DETECTED Final   Klebsiella aerogenes NOT DETECTED NOT DETECTED Final   Klebsiella oxytoca NOT DETECTED NOT DETECTED Final   Klebsiella pneumoniae NOT DETECTED NOT DETECTED Final   Proteus species NOT DETECTED NOT DETECTED Final   Salmonella species NOT DETECTED NOT DETECTED Final   Serratia marcescens NOT  DETECTED NOT DETECTED Final   Haemophilus influenzae NOT DETECTED NOT DETECTED Final   Neisseria meningitidis NOT DETECTED NOT DETECTED Final   Pseudomonas aeruginosa NOT DETECTED NOT DETECTED Final   Stenotrophomonas maltophilia NOT DETECTED NOT DETECTED Final   Candida albicans NOT DETECTED NOT DETECTED Final   Candida auris NOT DETECTED NOT DETECTED Final   Candida glabrata NOT DETECTED NOT DETECTED Final   Candida krusei NOT DETECTED NOT DETECTED Final   Candida parapsilosis NOT DETECTED NOT DETECTED Final   Candida tropicalis NOT DETECTED NOT DETECTED Final   Cryptococcus neoformans/gattii NOT DETECTED NOT DETECTED Final   Methicillin resistance mecA/C DETECTED (A) NOT DETECTED Final    Comment: CRITICAL RESULT CALLED TO, READ BACK BY AND VERIFIED WITH: PHARMD GREG ABBOTT 08/12/21@00 :32 BY TW Performed at [facility], [address] [place], [place] [address]   Blood Culture (routine x 2)     Status: None (Preliminary result)   Collection Time: 08/10/21 10:12 PM   Specimen: BLOOD LEFT HAND  Result Value Ref Range Status   Specimen Description BLOOD LEFT HAND  Final   Special Requests   Final    BOTTLES DRAWN AEROBIC AND ANAEROBIC Blood Culture adequate volume   Culture   Final    NO GROWTH 2 DAYS Performed at [facility], [place] [address], [place],  [address]    Report Status PENDING  Incomplete  Resp Panel by RT-PCR (Flu A&B, Covid) Nasopharyngeal Swab     Status: None   Collection Time: 08/10/21 11:19 PM   Specimen: Nasopharyngeal Swab; Nasopharyngeal(NP) swabs in vial transport medium  Result Value Ref Range Status   SARS Coronavirus 2 by RT PCR NEGATIVE NEGATIVE Final    Comment: (NOTE) SARS-CoV-2 target nucleic acids are NOT DETECTED.  The SARS-CoV-2 RNA is generally detectable in upper respiratory specimens during the acute phase of infection. The lowest concentration of SARS-CoV-2 viral copies this assay can detect is 138 copies/mL. A negative  result does not preclude SARS-Cov-2 infection and should not be used as the sole basis for treatment or other patient management decisions. A negative result may occur with  improper specimen collection/handling, submission of specimen other than nasopharyngeal swab, presence of viral mutation(s) within the areas targeted by this assay, and inadequate number of viral copies(<138 copies/mL). A negative result must be combined with clinical observations, patient history, and epidemiological information. The expected result is Negative.  Fact Sheet for Patients:  EntrepreneurPulse.com.au  Fact Sheet for Healthcare Providers:  IncredibleEmployment.be  This test is no t  yet approved or cleared by the [place] and  has been authorized for detection and/or diagnosis of SARS-CoV-2 by FDA under an Emergency Use Authorization (EUA). This EUA will remain  in effect (meaning this test can be used) for the duration of the COVID-19 declaration under Section 564(b)(1) of the Act, 21 U.S.C.section 360bbb-3(b)(1), unless the authorization is terminated  or revoked sooner.       Influenza A by PCR NEGATIVE NEGATIVE Final   Influenza B by PCR NEGATIVE NEGATIVE Final    Comment: (NOTE) The Xpert Xpress SARS-CoV-2/FLU/RSV plus assay is intended as an aid in the diagnosis of influenza from Nasopharyngeal swab specimens and should not be used as a sole basis for treatment. Nasal washings and aspirates are unacceptable for Xpert Xpress SARS-CoV-2/FLU/RSV testing.  Fact Sheet for Patients: EntrepreneurPulse.com.au  Fact Sheet for Healthcare Providers: IncredibleEmployment.be  This test is not yet approved or cleared by the [place] FDA and has been authorized for detection and/or diagnosis of SARS-CoV-2 by FDA under an Emergency Use Authorization (EUA). This EUA will remain in effect (meaning this test can be used)  for the duration of the COVID-19 declaration under Section 564(b)(1) of the Act, 21 U.S.C. section 360bbb-3(b)(1), unless the authorization is terminated or revoked.  Performed at [facility], [place] [address], [place], [place] [address]   Aerobic Culture w Gram Stain (superficial specimen)     Status: None (Preliminary result)   Collection Time: 08/11/21  5:10 PM   Specimen: Wound  Result Value Ref Range Status   Specimen Description WOUND RIGHT TOE  Final   Special Requests RT BIG TOE  Final   Gram Stain   Final    FEW FEW GRAM POSITIVE COCCI FEW GRAM POSITIVE RODS RARE GRAM NEGATIVE RODS RARE GRAM NEGATIVE COCCOBACILLI    Culture   Final    TOO YOUNG TO READ Performed at [facility], [place] [address] [place], [place] [address]    Report Status PENDING  Incomplete         Radiology Studies: DG Chest 1 View  Result Date: 08/10/2021 CLINICAL DATA:  Shortness of breath with generalized weakness and fatigue today. EXAM: CHEST  1 VIEW COMPARISON:  06/30/2020 FINDINGS: Heart size and pulmonary vascularity are normal. Lungs are clear. No pleural effusions. No pneumothorax. Mediastinal contours appear intact. IMPRESSION: No active disease. Electronically Signed   By: Lucienne Capers M.D.   On: 08/10/2021 23:23   PERIPHERAL VASCULAR CATHETERIZATION  Result Date: 08/11/2021 Images from the original result were not included. Patient name: Dionna Wiedemann MRN: [id number] DOB: 09-17-1980 Sex: female 08/11/2021 Pre-operative Diagnosis: Right foot gangrene Post-operative diagnosis:  Same Surgeon:  [name] Procedure Performed:  1.  Ultrasound-guided access, left femoral artery  2.  Abdominal aortogram  3.  Right lower extremity runoff  4.  Failed angioplasty, right posterior tibial artery  5.  Closure device, Celt  6.  Conscious sedation, 61 minutes  Indications: This is a 40 year old female with end-stage renal disease who presented to the emergency department with worsening  pain in her right foot.  She has dry gangrene of the foot and is at extremely high risk for transtibial amputation.  We discussed proceeding with angiography to evaluate her blood flow prior to any form of debridement of her foot. Procedure:  The patient was identified in the holding area and taken to room 8.  The patient was then placed supine on the table and prepped and draped in the  usual sterile fashion.  A time out was called.  Conscious sedation was administered with the use of IV fentanyl and Versed under continuous physician and nurse monitoring.  Heart rate, blood pressure, and oxygen saturation were continuously monitored.  Total sedation time was 61 minutes.  Ultrasound was used to evaluate the left common femoral artery.  It was patent .  A digital ultrasound image was acquired.  A micropuncture needle was used to access the left common femoral artery under ultrasound guidance.  An 018 wire was advanced without resistance and a micropuncture sheath was placed.  The 018 wire was removed and a benson wire was placed.  The micropuncture sheath was exchanged for a 5 french sheath.  An omniflush catheter was advanced over the wire to the level of L-1.  An abdominal angiogram was obtained.  Next, using the omniflush catheter and a benson wire, the aortic bifurcation was crossed and the catheter was placed into theright external iliac artery and right runoff was obtained. Findings:  Aortogram: Approximate 50% bilateral renal artery stenosis.  The infrarenal abdominal aorta is widely patent.  Bilateral common and external iliac arteries are widely patent.  Right Lower Extremity: Right common femoral profundofemoral artery widely patent without stenosis.  Superficial femoral arteries have a calcified without stenosis.  Popliteal artery is widely patent.  The only vessel to cross the ankle is the anterior tibial artery.  The peroneal artery becomes very diminutive in the lower leg.  The posterior tibial artery is  patent down to just above the ankle where it occludes.  Left Lower Extremity: Not evaluated Intervention: After the above images were acquired the decision made to proceed with intervention.  A 5 French 45 cm sheath was advanced over the bifurcation and positioned in the superficial femoral artery.  The patient was fully heparinized.  Next using a V-18 wire and a 3 x 100 Sterling balloon, I advanced the biliary wire into the posterior tibial artery.  I was able to get the wire down across the ankle however I could not get a 3 mm balloon to track.  I then downsized to a 2 mm Sterling again balloon would not track beyond the ankle.  I then switched out to a Confianza Pro 014 wire and then used a 1 x 20 balloon and again could not get this to track.  Because of the patient's renal disease, and heavy calcification with poor reconstitution of the target I elected to stop.  The left groin was closed with a Celt device without complication. Impression:  #1  No significant aortoiliac occlusive disease.  The superficial femoral and popliteal artery are widely patent on the right  #2  The dominant runoff to the foot is via the anterior tibial artery which does contribute to the plantar arch.  The peroneal artery is diminutive down in the lower leg.  The posterior tibial artery occludes above the ankle.  #3  Unsuccessful attempt at posterior tibial artery recanalization V. [name], M.D., [facility] [place] Office: [phone] Pager:  [phone]   DG Foot 2 Views Right  Result Date: 08/10/2021 CLINICAL DATA:  Pain. Missed dialysis today. Shortness of breath with generalized weakness and fatigue. EXAM: RIGHT FOOT - 2 VIEW COMPARISON:  None. FINDINGS: Soft tissue swelling and soft tissue gas around the medial right forefoot, involving the right first toe and dorsal soft tissues. This is consistent with gas gangrene. No definite bone erosion to suggest osteomyelitis. No acute fracture or  dislocation. Extensive vascular calcifications. IMPRESSION: Soft tissue swelling and soft tissue gas over the right first toe and dorsal forefoot consistent with gas gangrene. No radiographic evidence of osteomyelitis. Electronically Signed   By: Lucienne Capers M.D.   On: 08/10/2021 23:12        Scheduled Meds:  sodium chloride   Intravenous Once   Chlorhexidine Gluconate Cloth  6 each Topical Q0600   cinacalcet  90 mg Oral Daily   doxercalciferol  9 mcg Intravenous Q T,Th,Sa-HD   heparin  5,000 Units Subcutaneous Q8H   insulin aspart  0-6 Units Subcutaneous TID WC & HS   insulin glargine-yfgn  20 Units Subcutaneous QHS   LORazepam  1 mg Intravenous Once   losartan  50 mg Oral Daily   multivitamin  1 tablet Oral QHS   sevelamer carbonate  0.8 g Oral TID WC   sodium chloride flush  3 mL Intravenous Q12H   sodium chloride flush  3 mL Intravenous Q12H   Continuous Infusions:  sodium chloride     sodium chloride     ciprofloxacin Stopped (08/11/21 2339)   clindamycin (CLEOCIN) IV 600 mg (08/12/21 0814)   vancomycin       LOS: 1 day    Time spent: 30 minutes     Barb Merino, MD [facility] Pager [phone]

## 2021-08-12 NOTE — Progress Notes (Signed)
[facility] night cross cover note:  I was notified by RN of patient's request for something for pruritus.  As the patient is currently n.p.o., I placed order for prn IV Benadryl.     Babs Bertin, DO Hospitalist

## 2021-08-12 NOTE — Progress Notes (Signed)
[facility] night cross cover note:  I was notified by RN of patient's complaint of pain at her surgical site, status post right transmetatarsal amputation on 08/11/2021.  Current prn analgesic orders limited to prn Tylenol.  It appears that the patient has a documented allergy to morphine, associated with rash.  However, per chart review, it appears that she received multiple doses of IV fentanyl earlier in this hospitalization, without reported adverse response.  Consequently, I have placed order for as needed IV fentanyl.    Babs Bertin, DO Hospitalist

## 2021-08-12 NOTE — Progress Notes (Signed)
RN spoke with HD RN who confirmed that ordered 2 units PRBC'S is to be administered during dialysis, as well as IV Vancomycin.

## 2021-08-12 NOTE — Progress Notes (Addendum)
[name]  Subjective: underwent angiogram and R TMA yesterday. Seen in room, c/o pain in foot.   Vitals:   08/11/21 2334 08/12/21 0320 08/12/21 0818 08/12/21 1053  BP: (!) 155/76 (!) 141/75 (!) 153/83 122/64  Pulse: 77 72  70  Resp: 12 13  16   Temp: (!) 97.4 F (36.3 C) (!) 97.5 F (36.4 C) (!) 97.5 F (36.4 C) 97.8 F (36.6 C)  TempSrc: Oral Oral Oral Oral  SpO2: 100% 100% 100% 100%  Weight:      Height:        Exam:  alert, nad , nasal O2  no jvd  Chest cta bilat  Cor reg no RG  Abd soft ntnd no ascites   Ext no LE edema, R TMA wrapped   Alert, NF, ox3    RUE AVF +t/b   OP HD: [place] TTS   3h 66min  400/1.5  68.2kg  2/2 bath  15g  Hep 4000  AVF  - mircera 282mcg IV q 2 weeks- last dose 12/27  - hectorol 9 mcg IV q HD   - sensipar 90mg  2 tabs po QHS   - home meds > amlodipine 10mg  QD, renvela 2.4g 1 ac tid  Assessment/ Plan: R great toe diabetic infection: sp angiogram and 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 Getting empiric IV antibiotics.   ESRD:  TTS schedule. HD today. HD time will be shortened due to high census/ staffing issues.   Hypertension/volume: BP moderately elevated. Up 3-4kg but no respiratory issues. Will titrate down volume with HD as tolerated.   Anemia: Hgb 7.2, outpatient HD notes reveal pt had noticed red blood in her stools and had been referred to GI outpatient. Hb 7's yest and 6.6 today. 2u prbc's ordered to be given w/ HD today. Next esa due on 08/22/21.   Metabolic bone disease: Calcium is elevated. On high dose hectorol and sensipar, however not sure if she has been taking her sensipar as ordered. Will restart sensipar here, continue VDRA for now and follow calcium closely.   Nutrition:  Currently NPO for procedure T2DM: hyperglycemic on admit,  has not been taking her insulin at home. Management per primary team.      [place] Splinter 08/12/2021, 12:13 PM   Recent Labs  Lab 08/10/21 2212 08/11/21 0415 08/12/21 1123  K 4.0 4.3   --   BUN 67* 71*  --   CREATININE 12.56* 12.70*  --   CALCIUM 9.5 9.0  --   HGB 7.3* 7.2* 6.6*   Inpatient medications:  Chlorhexidine Gluconate Cloth  6 each Topical Q0600   cinacalcet  90 mg Oral Daily   doxercalciferol  9 mcg Intravenous Q T,Th,Sa-HD   heparin  5,000 Units Subcutaneous Q8H   insulin aspart  0-6 Units Subcutaneous TID WC & HS   insulin glargine-yfgn  20 Units Subcutaneous QHS   LORazepam  1 mg Intravenous Once   losartan  50 mg Oral Daily   multivitamin  1 tablet Oral QHS   sevelamer carbonate  0.8 g Oral TID WC   sodium chloride flush  3 mL Intravenous Q12H   sodium chloride flush  3 mL Intravenous Q12H    sodium chloride     sodium chloride     ciprofloxacin Stopped (08/11/21 2339)   clindamycin (CLEOCIN) IV 600 mg (08/12/21 0814)   vancomycin     sodium chloride, sodium chloride, acetaminophen **OR** acetaminophen, diphenhydrAMINE, fentaNYL (SUBLIMAZE) injection, hydrALAZINE, labetalol, naLOXone (NARCAN)  injection, ondansetron (  ZOFRAN) IV, oxyCODONE, senna-docusate, sodium chloride flush, sodium chloride flush

## 2021-08-13 DIAGNOSIS — D638 Anemia in other chronic diseases classified elsewhere: Secondary | ICD-10-CM | POA: Diagnosis not present

## 2021-08-13 DIAGNOSIS — I96 Gangrene, not elsewhere classified: Secondary | ICD-10-CM | POA: Diagnosis not present

## 2021-08-13 DIAGNOSIS — E871 Hypo-osmolality and hyponatremia: Secondary | ICD-10-CM

## 2021-08-13 DIAGNOSIS — N186 End stage renal disease: Secondary | ICD-10-CM | POA: Diagnosis not present

## 2021-08-13 DIAGNOSIS — Z992 Dependence on renal dialysis: Secondary | ICD-10-CM

## 2021-08-13 LAB — IRON AND TIBC
Iron: 32 ug/dL (ref 28–170)
Saturation Ratios: 21 % (ref 10.4–31.8)
TIBC: 155 ug/dL — ABNORMAL LOW (ref 250–450)
UIBC: 123 ug/dL

## 2021-08-13 LAB — TYPE AND SCREEN
ABO/RH(D): O POS
Antibody Screen: NEGATIVE
Unit division: 0
Unit division: 0

## 2021-08-13 LAB — FERRITIN: Ferritin: 1196 ng/mL — ABNORMAL HIGH (ref 11–307)

## 2021-08-13 LAB — GLUCOSE, CAPILLARY
Glucose-Capillary: 273 mg/dL — ABNORMAL HIGH (ref 70–99)
Glucose-Capillary: 251 mg/dL — ABNORMAL HIGH (ref 70–99)
Glucose-Capillary: 275 mg/dL — ABNORMAL HIGH (ref 70–99)
Glucose-Capillary: 227 mg/dL — ABNORMAL HIGH (ref 70–99)

## 2021-08-13 LAB — CBC
HCT: 31.2 % — ABNORMAL LOW (ref 36.0–46.0)
Hemoglobin: 10.1 g/dL — ABNORMAL LOW (ref 12.0–15.0)
MCH: 29.2 pg (ref 26.0–34.0)
MCHC: 32.4 g/dL (ref 30.0–36.0)
MCV: 90.2 fL (ref 80.0–100.0)
Platelets: 229 10*3/uL (ref 150–400)
RBC: 3.46 MIL/uL — ABNORMAL LOW (ref 3.87–5.11)
RDW: 17.2 % — ABNORMAL HIGH (ref 11.5–15.5)
WBC: 12.9 10*3/uL — ABNORMAL HIGH (ref 4.0–10.5)
nRBC: 2.6 % — ABNORMAL HIGH (ref 0.0–0.2)

## 2021-08-13 LAB — HEPATITIS B SURFACE ANTIBODY, QUANTITATIVE: Hep B S AB Quant (Post): >1000 m[IU]/mL (ref >9.9)

## 2021-08-13 LAB — BPAM RBC
Blood Product Expiration Date: 202301202359
Blood Product Expiration Date: 202301202359
ISSUE DATE / TIME: 202212311523
ISSUE DATE / TIME: 202212311523
Unit Type and Rh: 5100
Unit Type and Rh: 5100

## 2021-08-13 LAB — VITAMIN B12: Vitamin B-12: 1560 pg/mL — ABNORMAL HIGH (ref 180–914)

## 2021-08-13 NOTE — Progress Notes (Addendum)
°  Progress Note    08/13/2021 7:23 AM 2 Days Post-Op  Subjective:  c/o pain.  Wants pain medicine.  Wants me to find her phone.   afebrile  Vitals:   08/12/21 1926 08/13/21 0344  BP: (!) 153/72 (!) 152/70  Pulse: 80 85  Resp: 18 20  Temp: 98.4 F (36.9 C) 98.2 F (36.8 C)  SpO2: 100% 100%    Physical Exam: Cardiac:  regular Lungs:  non labored Incisions:     Extremities:  right foot with brisk DP doppler signal   CBC    Component Value Date/Time   WBC 10.6 (H) 08/12/2021 1123   RBC 2.28 (L) 08/12/2021 1123   HGB 6.6 (LL) 08/12/2021 1123   HGB 10.6 (L) 05/18/2020 0927   HCT 20.9 (L) 08/12/2021 1123   HCT 35.1 05/18/2020 0927   PLT 259 08/12/2021 1123   PLT 186 05/18/2020 0927   MCV 91.7 08/12/2021 1123   MCV 89 05/18/2020 0927   MCH 28.9 08/12/2021 1123   MCHC 31.6 08/12/2021 1123   RDW 16.0 (H) 08/12/2021 1123   RDW 13.9 05/18/2020 0927   LYMPHSABS 1.1 08/10/2021 2212   LYMPHSABS 1.6 05/18/2020 0927   MONOABS 0.6 08/10/2021 2212   EOSABS 0.2 08/10/2021 2212   EOSABS 0.6 (H) 05/18/2020 0927   BASOSABS 0.0 08/10/2021 2212   BASOSABS 0.0 05/18/2020 0927    BMET    Component Value Date/Time   NA 132 (L) 08/12/2021 1123   NA 133 (L) 05/18/2020 0927   K 4.1 08/12/2021 1123   CL 90 (L) 08/12/2021 1123   CO2 24 08/12/2021 1123   GLUCOSE 218 (H) 08/12/2021 1123   BUN 79 (H) 08/12/2021 1123   BUN 45 (H) 05/18/2020 0927   CREATININE 14.26 (H) 08/12/2021 1123   CALCIUM 9.6 08/12/2021 1123   GFRNONAA 3 (L) 08/12/2021 1123   GFRAA 6 (L) 05/18/2020 0927    INR    Component Value Date/Time   INR 1.2 08/10/2021 2212     Intake/Output Summary (Last 24 hours) at 08/13/2021 0723 Last data filed at 08/12/2021 2300 Gross per 24 hour  Intake 870 ml  Output 2500 ml  Net -1630 ml     Assessment/Plan:  41 y.o. female is s/p:  Open right TMA   2 Days Post-Op   -pt with brisk right DP doppler signal -wound is clean.  Continue wet to dry dressing  changes.  Per pt, dressing has not been changed since I changed it yesterday-orders were placed for bid dressing changes. RN for pt today will change again before her shift ends.  -pt has darco shoe in the room.  Discussed with pt to heel weight bear only with shoe in place.  -DVT prophylaxis:  sq heparin   [name], PA-C [facility] [phone] 08/13/2021 7:23 AM  I have seen and evaluated the patient. I agree with the PA note as documented above. TMA dressing changed and looks good as pictured above.  Continue wet to dry dressing changes.  Marty Heck, MD [facility] [place] Office: [phone]

## 2021-08-13 NOTE — Progress Notes (Signed)
PT Cancellation Note  Patient Details Name: [name] MRN: [id number] DOB: 13-Aug-1981   Cancelled Treatment:    Reason Eval/Treat Not Completed: Patient declined, no reason specified Pt refused mobility despite getting IV pain meds with nurse in room. Will follow.   [name] A Brondon Wann 08/13/2021, 12:21 PM Marisa Severin, PT, DPT Acute Rehabilitation Services Pager [phone] Office [phone]

## 2021-08-13 NOTE — Progress Notes (Signed)
PROGRESS NOTE    [name]  GDJ:[id number] DOB: 21-May-1981 DOA: 08/10/2021 PCP: [facility]    Brief Narrative:   41 year old with history of type 2 diabetes on insulin, ESRD on hemodialysis TTS schedule, neuropathy, hypertension with poor compliance came to the emergency room with right foot pain and infection.  She was found to have gangrenous toes.  Underwent angiogram and urgent transmetatarsal amputation.  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  Continue antibiotics for today, consider discontinue tomorrow after discussion with vascular surgery.  End-stage renal disease Anemia of chronic kidney disease. Patient is a followed by nephrology for dialysis.  Patient received 2 units PRBC yesterday for hemoglobin 6.6.  Recheck hemoglobin today and tomorrow.  Uncontrolled type 2 diabetes with hyperglycemia. Continue current regimen    DVT prophylaxis: Heparin Code Status: full Family Communication:  Disposition Plan:    Status is: Inpatient  Remains inpatient appropriate because: Severity of disease and IV antibiotics.        I/O last 3 completed shifts: In: 1214.6 [P.O.:240; Blood:630; IV Piggyback:344.6] Out: 2500 [Other:2500] Total I/O In: 240 [P.O.:240] Out: 1 [Stool:1]     Consultants:  Vascular, nephrology  Procedures: Transmetatarsal amputation.  Antimicrobials: (specify start and planned stop date. Auto populated tables are space occupying and do not give end dates) Vancomycin, clindamycin, Cipro   Subjective: Patient doing well today, foot pain under control. Denies any short of breath or  cough. No abdominal pain nausea vomiting No fever chills  Objective: Vitals:   08/12/21 1926 08/13/21 0344 08/13/21 0824 08/13/21 1050  BP: (!) 153/72 (!) 152/70 98/78 (!) 154/70  Pulse: 80 85 78 83  Resp: 18 20 19 18   Temp: 98.4 F (36.9 C) 98.2 F (36.8 C) 98.2 F (36.8 C) 98.2 F (36.8 C)  TempSrc: Axillary Oral Oral Oral  SpO2: 100% 100% 98% 99%  Weight:  69.3 kg    Height:        Intake/Output Summary (Last 24 hours) at 08/13/2021 1301 Last data filed at 08/13/2021 1200 Gross per 24 hour  Intake 1110 ml  Output 2501 ml  Net -1391 ml   Filed Weights   08/12/21 1511 08/12/21 1752 08/13/21 0344  Weight: 70.2 kg 68 kg 69.3 kg    Examination:  General exam: Appears calm and comfortable  Respiratory system: Clear to auscultation. Respiratory effort normal. Cardiovascular system: S1 & S2 heard, RRR. No JVD, murmurs, rubs, gallops or clicks. No pedal edema. Gastrointestinal system: Abdomen is nondistended, soft and nontender. No organomegaly or masses felt. Normal bowel sounds heard. Central nervous system: Alert and oriented. No focal neurological deficits. Extremities: Symmetric 5 x 5 power. Skin: No rashes, lesions or ulcers Psychiatry: Judgement and insight appear normal. Mood & affect appropriate.     Data Reviewed: I have personally reviewed following labs and imaging studies  CBC: Recent Labs  Lab 08/10/21 2212 08/11/21 0415 08/12/21 1123 08/13/21 0856  WBC 15.5* 15.0* 10.6* 12.9*  NEUTROABS 13.4*  --   --   --   HGB 7.3* 7.2* 6.6* 10.1*  HCT 24.2* 23.4* 20.9* 31.2*  MCV 93.4 94.7 91.7 90.2  PLT 348 291 259 229  Basic Metabolic Panel: Recent Labs  Lab 08/10/21 2212 08/11/21 0415 08/12/21 1123  NA 130* 129* 132*  K 4.0 4.3 4.1  CL 88* 89* 90*  CO2 24 22 24   GLUCOSE 473* 522* 218*  BUN 67* 71* 79*  CREATININE 12.56* 12.70* 14.26*  CALCIUM 9.5 9.0 9.6  PHOS  --   --  9.1*   GFR: Estimated Creatinine Clearance: 4.7 mL/min (A) (by C-G formula  based on SCr of 14.26 mg/dL (H)). Liver Function Tests: Recent Labs  Lab 08/10/21 2212 08/12/21 1123  AST 149*  --   ALT 321*  --   ALKPHOS 101  --   BILITOT 0.8  --   PROT 8.3*  --   ALBUMIN 2.4* 2.0*   No results for input(s): LIPASE, AMYLASE in the last 168 hours. No results for input(s): AMMONIA in the last 168 hours. Coagulation Profile: Recent Labs  Lab 08/10/21 2212  INR 1.2   Cardiac Enzymes: No results for input(s): CKTOTAL, CKMB, CKMBINDEX, TROPONINI in the last 168 hours. BNP (last 3 results) No results for input(s): PROBNP in the last 8760 hours. HbA1C: Recent Labs    08/11/21 0415  HGBA1C 10.4*   CBG: Recent Labs  Lab 08/12/21 0627 08/12/21 1056 08/12/21 2114 08/13/21 0614 08/13/21 1052  GLUCAP 240* 219* 129* 273* 251*   Lipid Profile: No results for input(s): CHOL, HDL, LDLCALC, TRIG, CHOLHDL, LDLDIRECT in the last 72 hours. Thyroid Function Tests: No results for input(s): TSH, T4TOTAL, FREET4, T3FREE, THYROIDAB in the last 72 hours. Anemia Panel: Recent Labs    08/13/21 0856  VITAMINB12 1,560*  FERRITIN 1,196*  TIBC 155*  IRON 32   Sepsis Labs: Recent Labs  Lab 08/10/21 2212  LATICACIDVEN 1.5    Recent Results (from the past 240 hour(s))  Blood Culture (routine x 2)     Status: Abnormal (Preliminary result)   Collection Time: 08/10/21  9:56 PM   Specimen: BLOOD  Result Value Ref Range Status   Specimen Description BLOOD  Final   Special Requests   Final    BOTTLES DRAWN AEROBIC AND ANAEROBIC Blood Culture adequate volume   Culture  Setup Time   Final    GRAM POSITIVE COCCI AEROBIC BOTTLE ONLY CRITICAL RESULT CALLED TO, READ BACK BY AND VERIFIED WITH: PHARMD GREG ABBOTT 08/12/21@00 :32 BY TW    Culture (A)  Final    STAPHYLOCOCCUS EPIDERMIDIS THE SIGNIFICANCE OF ISOLATING THIS ORGANISM FROM A SINGLE SET OF BLOOD CULTURES WHEN MULTIPLE SETS ARE DRAWN IS UNCERTAIN. PLEASE NOTIFY THE MICROBIOLOGY DEPARTMENT WITHIN ONE WEEK IF  SPECIATION AND SENSITIVITIES ARE REQUIRED. Performed at [facility], [place] [address] [place], [place] [address]    Report Status PENDING  Incomplete  Blood Culture ID Panel (Reflexed)     Status: Abnormal   Collection Time: 08/10/21  9:56 PM  Result Value Ref Range Status   Enterococcus faecalis NOT DETECTED NOT DETECTED Final   Enterococcus Faecium NOT DETECTED NOT DETECTED Final   Listeria monocytogenes NOT DETECTED NOT DETECTED Final   Staphylococcus species DETECTED (A) NOT DETECTED Final    Comment: CRITICAL RESULT CALLED TO, READ BACK BY AND VERIFIED WITH: PHARMD GREG ABBOTT 08/12/21@00 :32 BY TW    Staphylococcus aureus (BCID) NOT DETECTED NOT DETECTED Final   Staphylococcus epidermidis DETECTED (A) NOT DETECTED Final    Comment: Methicillin (oxacillin) resistant coagulase negative staphylococcus. Possible blood culture contaminant (unless isolated from more than one blood culture draw or clinical case suggests pathogenicity). No antibiotic treatment  is indicated for blood  culture contaminants. CRITICAL RESULT CALLED TO, READ BACK BY AND VERIFIED WITH: PHARMD GREG ABBOTT 08/12/21@00 :32 BY TW    Staphylococcus lugdunensis NOT DETECTED NOT DETECTED Final   Streptococcus species NOT DETECTED NOT DETECTED Final   Streptococcus agalactiae NOT DETECTED NOT DETECTED Final   Streptococcus pneumoniae NOT DETECTED NOT DETECTED Final   Streptococcus pyogenes NOT DETECTED NOT DETECTED Final   A.calcoaceticus-baumannii NOT DETECTED NOT DETECTED Final   Bacteroides fragilis NOT DETECTED NOT DETECTED Final   Enterobacterales NOT DETECTED NOT DETECTED Final   Enterobacter cloacae complex NOT DETECTED NOT DETECTED Final   Escherichia coli NOT DETECTED NOT DETECTED Final   Klebsiella aerogenes NOT DETECTED NOT DETECTED Final   Klebsiella oxytoca NOT DETECTED NOT DETECTED Final   Klebsiella pneumoniae NOT DETECTED NOT DETECTED Final   Proteus species NOT DETECTED NOT DETECTED Final    Salmonella species NOT DETECTED NOT DETECTED Final   Serratia marcescens NOT DETECTED NOT DETECTED Final   Haemophilus influenzae NOT DETECTED NOT DETECTED Final   Neisseria meningitidis NOT DETECTED NOT DETECTED Final   Pseudomonas aeruginosa NOT DETECTED NOT DETECTED Final   Stenotrophomonas maltophilia NOT DETECTED NOT DETECTED Final   Candida albicans NOT DETECTED NOT DETECTED Final   Candida auris NOT DETECTED NOT DETECTED Final   Candida glabrata NOT DETECTED NOT DETECTED Final   Candida krusei NOT DETECTED NOT DETECTED Final   Candida parapsilosis NOT DETECTED NOT DETECTED Final   Candida tropicalis NOT DETECTED NOT DETECTED Final   Cryptococcus neoformans/gattii NOT DETECTED NOT DETECTED Final   Methicillin resistance mecA/C DETECTED (A) NOT DETECTED Final    Comment: CRITICAL RESULT CALLED TO, READ BACK BY AND VERIFIED WITH: PHARMD GREG ABBOTT 08/12/21@00 :32 BY TW Performed at [facility], [address], [place], [place] [address]   Blood Culture (routine x 2)     Status: None (Preliminary result)   Collection Time: 08/10/21 10:12 PM   Specimen: BLOOD LEFT HAND  Result Value Ref Range Status   Specimen Description BLOOD LEFT HAND  Final   Special Requests   Final    BOTTLES DRAWN AEROBIC AND ANAEROBIC Blood Culture adequate volume   Culture   Final    NO GROWTH 3 DAYS Performed at [facility], [place] [address], [place], [place] [address]    Report Status PENDING  Incomplete  Resp Panel by RT-PCR (Flu A&B, Covid) Nasopharyngeal Swab     Status: None   Collection Time: 08/10/21 11:19 PM   Specimen: Nasopharyngeal Swab; Nasopharyngeal(NP) swabs in vial transport medium  Result Value Ref Range Status   SARS Coronavirus 2 by RT PCR NEGATIVE NEGATIVE Final    Comment: (NOTE) SARS-CoV-2 target nucleic acids are NOT DETECTED.  The SARS-CoV-2 RNA is generally detectable in upper respiratory specimens during the acute phase of infection. The lowest concentration of  SARS-CoV-2 viral copies this assay can detect is 138 copies/mL. A negative result does not preclude SARS-Cov-2 infection and should not be used as the sole basis for treatment or other patient management decisions. A negative result may occur with  improper specimen collection/handling, submission of specimen other than nasopharyngeal swab, presence of viral mutation(s) within the areas targeted by this assay, and inadequate number of viral copies(<138 copies/mL). A negative result must be combined with clinical observations, patient history, and epidemiological information. The expected result is Negative.  Fact Sheet for Patients:  EntrepreneurPulse.com.au  Fact Sheet for Healthcare Providers:  IncredibleEmployment.be  This test is no t  yet approved or cleared by the [place] and  has been authorized for detection and/or diagnosis of SARS-CoV-2 by FDA under an Emergency Use Authorization (EUA). This EUA will remain  in effect (meaning this test can be used) for the duration of the COVID-19 declaration under Section 564(b)(1) of the Act, 21 U.S.C.section 360bbb-3(b)(1), unless the authorization is terminated  or revoked sooner.       Influenza A by PCR NEGATIVE NEGATIVE Final   Influenza B by PCR NEGATIVE NEGATIVE Final    Comment: (NOTE) The Xpert Xpress SARS-CoV-2/FLU/RSV plus assay is intended as an aid in the diagnosis of influenza from Nasopharyngeal swab specimens and should not be used as a sole basis for treatment. Nasal washings and aspirates are unacceptable for Xpert Xpress SARS-CoV-2/FLU/RSV testing.  Fact Sheet for Patients: EntrepreneurPulse.com.au  Fact Sheet for Healthcare Providers: IncredibleEmployment.be  This test is not yet approved or cleared by the [place] FDA and has been authorized for detection and/or diagnosis of SARS-CoV-2 by FDA under an Emergency Use  Authorization (EUA). This EUA will remain in effect (meaning this test can be used) for the duration of the COVID-19 declaration under Section 564(b)(1) of the Act, 21 U.S.C. section 360bbb-3(b)(1), unless the authorization is terminated or revoked.  Performed at [facility], [place] [address] [place], [place] [address]   Aerobic Culture w Gram Stain (superficial specimen)     Status: None (Preliminary result)   Collection Time: 08/11/21  5:10 PM   Specimen: Wound  Result Value Ref Range Status   Specimen Description WOUND RIGHT TOE  Final   Special Requests RT BIG TOE  Final   Gram Stain   Final    FEW FEW GRAM POSITIVE COCCI FEW GRAM POSITIVE RODS RARE GRAM NEGATIVE RODS RARE GRAM NEGATIVE COCCOBACILLI    Culture   Final    FEW STAPHYLOCOCCUS AUREUS SUSCEPTIBILITIES TO FOLLOW Performed at [facility], [place] [address], [place], [place] [address]    Report Status PENDING  Incomplete         Radiology Studies: No results found.      Scheduled Meds:  sodium chloride   Intravenous Once   Chlorhexidine Gluconate Cloth  6 each Topical Q0600   cinacalcet  90 mg Oral Daily   doxercalciferol  9 mcg Intravenous Q T,Th,Sa-HD   heparin  5,000 Units Subcutaneous Q8H   insulin aspart  0-6 Units Subcutaneous TID WC & HS   insulin glargine-yfgn  20 Units Subcutaneous QHS   LORazepam  1 mg Intravenous Once   losartan  50 mg Oral Daily   multivitamin  1 tablet Oral QHS   sevelamer carbonate  0.8 g Oral TID WC   sodium chloride flush  3 mL Intravenous Q12H   sodium chloride flush  3 mL Intravenous Q12H   Continuous Infusions:  sodium chloride     sodium chloride     ciprofloxacin 400 mg (08/12/21 2323)   clindamycin (CLEOCIN) IV 600 mg (08/13/21 0844)   vancomycin Stopped (08/12/21 1755)     LOS: 2 days    Time spent: 26 minutes    Sharen Hones, MD [facility]   To contact the attending provider between 7A-7P or the covering provider during  after hours 7P-7A, please log into the web site [email or web address] and access using universal Enterprise password for that web site. If you do not have the password, please call the hospital operator.  08/13/2021, 1:01 PM

## 2021-08-13 NOTE — Progress Notes (Signed)
Mobility Specialist: Progress Note   08/13/21 1631  Mobility  Activity Transferred:  Chair to bed  Level of Assistance Maximum assist, patient does 25-49%  Assistive Device Front wheel walker  Distance Ambulated (ft) 2 ft  Mobility Out of bed to chair with meals  Mobility Response Tolerated fair  Mobility performed by Mobility specialist;Nurse  $Mobility charge 1 Mobility   Pt assisted back to bed per RN request. Pt required maxA to stand as well as verbal cues for hand placement throughout. Pt back to bed with RN present in the room.   [facility] Ziana Heyliger Mobility Specialist Mobility Specialist 4 [place]: [phone] Mobility Specialist 2 [place] and [place]: [phone]

## 2021-08-14 DIAGNOSIS — N186 End stage renal disease: Secondary | ICD-10-CM | POA: Diagnosis not present

## 2021-08-14 DIAGNOSIS — Z992 Dependence on renal dialysis: Secondary | ICD-10-CM | POA: Diagnosis not present

## 2021-08-14 DIAGNOSIS — I96 Gangrene, not elsewhere classified: Secondary | ICD-10-CM | POA: Diagnosis not present

## 2021-08-14 LAB — CBC WITH DIFFERENTIAL/PLATELET
Abs Immature Granulocytes: 0.11 10*3/uL — ABNORMAL HIGH (ref 0.00–0.07)
Basophils Absolute: 0 10*3/uL (ref 0.0–0.1)
Basophils Relative: 0 %
Eosinophils Absolute: 0.6 10*3/uL — ABNORMAL HIGH (ref 0.0–0.5)
Eosinophils Relative: 4 %
HCT: 29.7 % — ABNORMAL LOW (ref 36.0–46.0)
Hemoglobin: 9.7 g/dL — ABNORMAL LOW (ref 12.0–15.0)
Immature Granulocytes: 1 %
Lymphocytes Relative: 11 %
Lymphs Abs: 1.6 10*3/uL (ref 0.7–4.0)
MCH: 29.4 pg (ref 26.0–34.0)
MCHC: 32.7 g/dL (ref 30.0–36.0)
MCV: 90 fL (ref 80.0–100.0)
Monocytes Absolute: 0.9 10*3/uL (ref 0.1–1.0)
Monocytes Relative: 6 %
Neutro Abs: 11.3 10*3/uL — ABNORMAL HIGH (ref 1.7–7.7)
Neutrophils Relative %: 78 %
Platelets: 244 10*3/uL (ref 150–400)
RBC: 3.3 MIL/uL — ABNORMAL LOW (ref 3.87–5.11)
RDW: 17 % — ABNORMAL HIGH (ref 11.5–15.5)
WBC: 14.6 10*3/uL — ABNORMAL HIGH (ref 4.0–10.5)
nRBC: 1 % — ABNORMAL HIGH (ref 0.0–0.2)

## 2021-08-14 LAB — RENAL FUNCTION PANEL
Albumin: 2 g/dL — ABNORMAL LOW (ref 3.5–5.0)
Anion gap: 17 — ABNORMAL HIGH (ref 5–15)
BUN: 59 mg/dL — ABNORMAL HIGH (ref 6–20)
CO2: 24 mmol/L (ref 22–32)
Calcium: 9.3 mg/dL (ref 8.9–10.3)
Chloride: 90 mmol/L — ABNORMAL LOW (ref 98–111)
Creatinine, Ser: 11.6 mg/dL — ABNORMAL HIGH (ref 0.44–1.00)
GFR, Estimated: 4 mL/min — ABNORMAL LOW (ref >60)
Glucose, Bld: 316 mg/dL — ABNORMAL HIGH (ref 70–99)
Phosphorus: 8.3 mg/dL — ABNORMAL HIGH (ref 2.5–4.6)
Potassium: 3.6 mmol/L (ref 3.5–5.1)
Sodium: 131 mmol/L — ABNORMAL LOW (ref 135–145)

## 2021-08-14 LAB — CULTURE, BLOOD (ROUTINE X 2): Special Requests: ADEQUATE

## 2021-08-14 LAB — AEROBIC CULTURE W GRAM STAIN (SUPERFICIAL SPECIMEN)

## 2021-08-14 LAB — GLUCOSE, CAPILLARY
Glucose-Capillary: 299 mg/dL — ABNORMAL HIGH (ref 70–99)
Glucose-Capillary: 273 mg/dL — ABNORMAL HIGH (ref 70–99)
Glucose-Capillary: 330 mg/dL — ABNORMAL HIGH (ref 70–99)
Glucose-Capillary: 275 mg/dL — ABNORMAL HIGH (ref 70–99)

## 2021-08-14 MED ORDER — CHLORHEXIDINE GLUCONATE CLOTH 2 % EX PADS
6.0000 | MEDICATED_PAD | Freq: Every day | CUTANEOUS | Status: DC
  Administered 2021-08-15 – 2021-08-21 (×6): 6 via TOPICAL

## 2021-08-14 NOTE — Progress Notes (Signed)
[facility] Progress Note  Subjective: Last HD on 12/31 with 2.5 kg UF.  She keeps eyes closed and answers questions asked but does not offer additional information.  States hasn't recently gotten pain meds - per charting she got benadryl and fentanyl early this am and oxycodone at 8:23 am.   Review of systems: Denies shortness of breath or chest pain Denies n/v  Vitals:   08/13/21 1540 08/13/21 1557 08/14/21 0050 08/14/21 0808  BP: (!) 89/52 125/67 94/67 (!) 159/71  Pulse: (!) 58  (!) 58 (!) 110  Resp: 17  12 16   Temp: 97.8 F (36.6 C)  97.8 F (36.6 C) 98.5 F (36.9 C)  TempSrc: Oral  Oral Oral  SpO2: 98%  98% 100%  Weight:      Height:        Exam:   General adult female in bed in no acute distress HEENT normocephalic atraumatic Neck supple trachea midline Lungs clear to auscultation bilaterally normal work of breathing at rest on room air Heart S1S2 no rub Abdomen soft nontender nondistended Extremities no edema appreciated lower extremities; right foot is wrapped Psych normal mood and affect Neuro - sleepy but alert and oriented x 3  Access - RUE AVF with bruit and thrill    OP HD: [place] [facility]TS   3h 63min  400/1.5  68.2kg  2/2 bath  15g  Hep 4000  AVF  - mircera 259mcg IV q 2 weeks- last dose 12/27  - hectorol 9 mcg IV q HD   - sensipar 90mg  2 tabs po QHS   - home meds > amlodipine 10mg  QD, renvela 2.4g 1 ac tid  Assessment/ Plan: R great toe diabetic infection: s/p angiogram and 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  IV antibiotics per primary team   ESRD:  HD per TTS schedule.  Renal panel today and in AM  Hypertension/volume: optimize volume with HD  Anemia CKD and blood loss: outpatient HD notes reveal pt had noticed red blood in her stools and had been referred to GI outpatient. Note pt got 2 units PRBC's for Hb 6.6 on 12/31 per charting.  Next esa due on 08/22/21.   Metabolic bone disease: hyperphosphatemia. Hypercalcemia - (corrects to 11.2 per last available labs).   Stop high dose hectorol. We restarted sensipar here.  T2DM: hyperglycemic on admit,  had not been taking her insulin at home. Management per primary team.   Disposition - per primary team    Recent Labs  Lab 08/11/21 0415 08/12/21 1123 08/13/21 0856 08/14/21 0445  K 4.3 4.1  --   --   BUN 71* 79*  --   --   CREATININE 12.70* 14.26*  --   --   CALCIUM 9.0 9.6  --   --   PHOS  --  9.1*  --   --   HGB 7.2* 6.6* 10.1* 9.7*   Inpatient medications:  sodium chloride   Intravenous Once   Chlorhexidine Gluconate Cloth  6 each Topical Q0600   cinacalcet  90 mg Oral Daily   doxercalciferol  9 mcg Intravenous Q T,Th,Sa-HD   heparin  5,000 Units Subcutaneous Q8H   insulin aspart  0-6 Units Subcutaneous TID WC & HS   insulin glargine-yfgn  20 Units Subcutaneous QHS   LORazepam  1 mg Intravenous Once   losartan  50 mg Oral Daily   multivitamin  1 tablet Oral QHS   sevelamer carbonate  0.8 g Oral TID WC   sodium chloride flush  3 mL Intravenous Q12H   sodium chloride flush  3 mL Intravenous Q12H    sodium chloride     sodium chloride     ciprofloxacin 400 mg (08/13/21 2224)   vancomycin Stopped (08/12/21 1755)   sodium chloride, sodium chloride, acetaminophen **OR** acetaminophen, diphenhydrAMINE, fentaNYL (SUBLIMAZE) injection, hydrALAZINE, labetalol, naLOXone (NARCAN)  injection, ondansetron (ZOFRAN) IV, oxyCODONE, senna-docusate, sodium chloride flush, sodium chloride flush   Claudia Desanctis, MD 08/14/2021 11:55 AM

## 2021-08-14 NOTE — Progress Notes (Addendum)
PT Cancellation Note  Patient Details Name: [name] MRN: [id number] DOB: June 29, 1981   Cancelled Treatment:    Reason Eval/Treat Not Completed: Pain limiting ability to participate;Fatigue/lethargy limiting ability to participate; attempted x 2 to see pt.  First she was lethargic and requesting to sleep.  Second, she was still asleep but encouraging and forcing to get up since has HD tomorrow and has not yet been up with PT, but she c/o pain and continued to refuse to mobilize.  Did find out she has a spouse who works and lives in 2 level home.  Based on info from mobility tech from yesterday and home situation she will likely need STSNF.  PT will continue attempts.    Reginia Naas 08/14/2021, 4:43 PM Magda Kiel, PT Acute Rehabilitation Services Pager:[phone] Office:[phone] 08/14/2021

## 2021-08-14 NOTE — Progress Notes (Signed)
AM TMA site dressing change completed by Vascular PA w/RN at bedside.

## 2021-08-14 NOTE — Care Management Important Message (Signed)
Important Message  Patient Details  Name: [name] MRN: [id number] Date of Birth: September 18, 1980   Medicare Important Message Given:  Yes     Hannah Crill 08/14/2021, 2:27 PM

## 2021-08-14 NOTE — Progress Notes (Addendum)
PROGRESS NOTE    [name]  GYI:[id number] DOB: 1981-05-05 DOA: 08/10/2021 PCP: [facility]    Brief Narrative:  41 year old with history of type 2 diabetes on insulin, ESRD on hemodialysis TTS schedule, neuropathy, hypertension with poor compliance came to the emergency room with right foot pain and infection.  She was found to have gangrenous toes.  Underwent angiogram and urgent transmetatarsal amputation.   Assessment & Plan:   Principal Problem:   Gangrene of right foot (HCC) Active Problems:   Anemia of chronic disease   ESRD on hemodialysis ([place])   Uncontrolled type 2 diabetes mellitus with hyperglycemia (HCC)   Hyponatremia  Diabetic foot infection, osteomyelitis and gangrene of the right toes: Blood culture in 1 bottle was positive for staph epidermis, most likely contaminant. Wound culture still pending with the multiple bacteria growing. Discussed with vascular surgery, patient has very severe osteomyelitis, probably will have a wound closure in the next few days.  But will need antibiotics for 6 weeks.  We will determine if patient need IV or oral at the next surgery. Continue current antibiotics.   End-stage renal disease Anemia of chronic kidney disease. Status post 2 units of PRBC. Followed by nephrology for routine dialysis.  Uncontrolled type 2 diabetes with hyperglycemia. Continue current regimen       DVT prophylaxis: Heparin Code Status: full Family Communication: husband updated.  Disposition Plan:      Status is: Inpatient   Remains inpatient appropriate because: Severity of disease and IV antibiotics.     I/O last 3 completed shifts: In: 3500 [P.O.:720; IV Piggyback:450] Out: 1 [Stool:1] No intake/output data recorded.     Consultants:  Vascular surgery and nephrology.  Procedures: Foot partial amputation  Antimicrobials: Vancomycin, clindamycin and Cipro  Subjective: Patient still complaining of  pain in her foot.  Otherwise she is doing well.  Denies any short of breath or cough. No fever or chills. No abdominal pain or nausea vomiting, no diarrhea.   Objective: Vitals:   08/13/21 1540 08/13/21 1557 08/14/21 0050 08/14/21 0808  BP: (!) 89/52 125/67 94/67 (!) 159/71  Pulse: (!) 58  (!) 58 (!) 110  Resp: 17  12 16   Temp: 97.8 F (36.6 C)  97.8 F (36.6 C) 98.5 F (36.9 C)  TempSrc: Oral  Oral Oral  SpO2: 98%  98% 100%  Weight:      Height:        Intake/Output Summary (Last 24 hours) at 08/14/2021 1100 Last data filed at 08/13/2021 2200 Gross per 24 hour  Intake 930 ml  Output 1 ml  Net 929 ml   Filed Weights   08/12/21 1511 08/12/21 1752 08/13/21 0344  Weight: 70.2 kg 68 kg 69.3 kg    Examination:  General exam: Appears calm and comfortable  Respiratory system: Clear to auscultation. Respiratory effort normal. Cardiovascular system: S1 & S2 heard, RRR. No JVD, murmurs, rubs, gallops or clicks. No pedal edema. Gastrointestinal system: Abdomen is nondistended, soft and nontender. No organomegaly or masses felt. Normal bowel sounds heard. Central nervous system: Alert and oriented. No focal neurological deficits. Extremities: Left foot wound Skin: No rashes, lesions or ulcers Psychiatry: Judgement and insight appear normal. Mood & affect appropriate.     Data Reviewed: I have personally reviewed following labs and imaging studies  CBC: Recent Labs  Lab 08/10/21 2212 08/11/21 0415 08/12/21 1123 08/13/21 0856 08/14/21 0445  WBC 15.5* 15.0* 10.6* 12.9* 14.6*  NEUTROABS 13.4*  --   --   --  11.3*  HGB 7.3* 7.2* 6.6* 10.1* 9.7*  HCT 24.2* 23.4* 20.9* 31.2* 29.7*  MCV 93.4 94.7 91.7 90.2 90.0  PLT 348 291 259 229 672   Basic Metabolic Panel: Recent Labs  Lab 08/10/21 2212 08/11/21 0415 08/12/21 1123  NA 130* 129* 132*  K 4.0 4.3 4.1  CL 88* 89* 90*  CO2 24 22 24   GLUCOSE 473* 522* 218*  BUN 67* 71* 79*  CREATININE 12.56* 12.70* 14.26*  CALCIUM  9.5 9.0 9.6  PHOS  --   --  9.1*   GFR: Estimated Creatinine Clearance: 4.7 mL/min (A) (by C-G formula based on SCr of 14.26 mg/dL (H)). Liver Function Tests: Recent Labs  Lab 08/10/21 2212 08/12/21 1123  AST 149*  --   ALT 321*  --   ALKPHOS 101  --   BILITOT 0.8  --   PROT 8.3*  --   ALBUMIN 2.4* 2.0*   No results for input(s): LIPASE, AMYLASE in the last 168 hours. No results for input(s): AMMONIA in the last 168 hours. Coagulation Profile: Recent Labs  Lab 08/10/21 2212  INR 1.2   Cardiac Enzymes: No results for input(s): CKTOTAL, CKMB, CKMBINDEX, TROPONINI in the last 168 hours. BNP (last 3 results) No results for input(s): PROBNP in the last 8760 hours. HbA1C: No results for input(s): HGBA1C in the last 72 hours. CBG: Recent Labs  Lab 08/13/21 0614 08/13/21 1052 08/13/21 1543 08/13/21 2146 08/14/21 0804  GLUCAP 273* 251* 275* 227* 299*   Lipid Profile: No results for input(s): CHOL, HDL, LDLCALC, TRIG, CHOLHDL, LDLDIRECT in the last 72 hours. Thyroid Function Tests: No results for input(s): TSH, T4TOTAL, FREET4, T3FREE, THYROIDAB in the last 72 hours. Anemia Panel: Recent Labs    08/13/21 0856  VITAMINB12 1,560*  FERRITIN 1,196*  TIBC 155*  IRON 32   Sepsis Labs: Recent Labs  Lab 08/10/21 2212  LATICACIDVEN 1.5    Recent Results (from the past 240 hour(s))  Blood Culture (routine x 2)     Status: Abnormal   Collection Time: 08/10/21  9:56 PM   Specimen: BLOOD  Result Value Ref Range Status   Specimen Description BLOOD  Final   Special Requests   Final    BOTTLES DRAWN AEROBIC AND ANAEROBIC Blood Culture adequate volume   Culture  Setup Time   Final    GRAM POSITIVE COCCI AEROBIC BOTTLE ONLY CRITICAL RESULT CALLED TO, READ BACK BY AND VERIFIED WITH: PHARMD GREG ABBOTT 08/12/21@00 :32 BY TW    Culture (A)  Final    STAPHYLOCOCCUS EPIDERMIDIS THE SIGNIFICANCE OF ISOLATING THIS ORGANISM FROM A SINGLE SET OF BLOOD CULTURES WHEN MULTIPLE  SETS ARE DRAWN IS UNCERTAIN. PLEASE NOTIFY THE MICROBIOLOGY DEPARTMENT WITHIN ONE WEEK IF SPECIATION AND SENSITIVITIES ARE REQUIRED. Performed at [facility], [place] [address], [place], [place] [address]    Report Status 08/14/2021 FINAL  Final  Blood Culture ID Panel (Reflexed)     Status: Abnormal   Collection Time: 08/10/21  9:56 PM  Result Value Ref Range Status   Enterococcus faecalis NOT DETECTED NOT DETECTED Final   Enterococcus Faecium NOT DETECTED NOT DETECTED Final   Listeria monocytogenes NOT DETECTED NOT DETECTED Final   Staphylococcus species DETECTED (A) NOT DETECTED Final    Comment: CRITICAL RESULT CALLED TO, READ BACK BY AND VERIFIED WITH: PHARMD GREG ABBOTT 08/12/21@00 :32 BY TW    Staphylococcus aureus (BCID) NOT DETECTED NOT DETECTED Final   Staphylococcus epidermidis DETECTED (A) NOT DETECTED Final  Comment: Methicillin (oxacillin) resistant coagulase negative staphylococcus. Possible blood culture contaminant (unless isolated from more than one blood culture draw or clinical case suggests pathogenicity). No antibiotic treatment is indicated for blood  culture contaminants. CRITICAL RESULT CALLED TO, READ BACK BY AND VERIFIED WITH: PHARMD GREG ABBOTT 08/12/21@00 :32 BY TW    Staphylococcus lugdunensis NOT DETECTED NOT DETECTED Final   Streptococcus species NOT DETECTED NOT DETECTED Final   Streptococcus agalactiae NOT DETECTED NOT DETECTED Final   Streptococcus pneumoniae NOT DETECTED NOT DETECTED Final   Streptococcus pyogenes NOT DETECTED NOT DETECTED Final   A.calcoaceticus-baumannii NOT DETECTED NOT DETECTED Final   Bacteroides fragilis NOT DETECTED NOT DETECTED Final   Enterobacterales NOT DETECTED NOT DETECTED Final   Enterobacter cloacae complex NOT DETECTED NOT DETECTED Final   Escherichia coli NOT DETECTED NOT DETECTED Final   Klebsiella aerogenes NOT DETECTED NOT DETECTED Final   Klebsiella oxytoca NOT DETECTED NOT DETECTED Final   Klebsiella  pneumoniae NOT DETECTED NOT DETECTED Final   Proteus species NOT DETECTED NOT DETECTED Final   Salmonella species NOT DETECTED NOT DETECTED Final   Serratia marcescens NOT DETECTED NOT DETECTED Final   Haemophilus influenzae NOT DETECTED NOT DETECTED Final   Neisseria meningitidis NOT DETECTED NOT DETECTED Final   Pseudomonas aeruginosa NOT DETECTED NOT DETECTED Final   Stenotrophomonas maltophilia NOT DETECTED NOT DETECTED Final   Candida albicans NOT DETECTED NOT DETECTED Final   Candida auris NOT DETECTED NOT DETECTED Final   Candida glabrata NOT DETECTED NOT DETECTED Final   Candida krusei NOT DETECTED NOT DETECTED Final   Candida parapsilosis NOT DETECTED NOT DETECTED Final   Candida tropicalis NOT DETECTED NOT DETECTED Final   Cryptococcus neoformans/gattii NOT DETECTED NOT DETECTED Final   Methicillin resistance mecA/C DETECTED (A) NOT DETECTED Final    Comment: CRITICAL RESULT CALLED TO, READ BACK BY AND VERIFIED WITH: PHARMD GREG ABBOTT 08/12/21@00 :32 BY TW Performed at [facility], [address] [place], [place] [address]   Blood Culture (routine x 2)     Status: None (Preliminary result)   Collection Time: 08/10/21 10:12 PM   Specimen: BLOOD LEFT HAND  Result Value Ref Range Status   Specimen Description BLOOD LEFT HAND  Final   Special Requests   Final    BOTTLES DRAWN AEROBIC AND ANAEROBIC Blood Culture adequate volume   Culture   Final    NO GROWTH 4 DAYS Performed at [facility], [place] [address], [place], [place] [address]    Report Status PENDING  Incomplete  Resp Panel by RT-PCR (Flu A&B, Covid) Nasopharyngeal Swab     Status: None   Collection Time: 08/10/21 11:19 PM   Specimen: Nasopharyngeal Swab; Nasopharyngeal(NP) swabs in vial transport medium  Result Value Ref Range Status   SARS Coronavirus 2 by RT PCR NEGATIVE NEGATIVE Final    Comment: (NOTE) SARS-CoV-2 target nucleic acids are NOT DETECTED.  The SARS-CoV-2 RNA is generally detectable  in upper respiratory specimens during the acute phase of infection. The lowest concentration of SARS-CoV-2 viral copies this assay can detect is 138 copies/mL. A negative result does not preclude SARS-Cov-2 infection and should not be used as the sole basis for treatment or other patient management decisions. A negative result may occur with  improper specimen collection/handling, submission of specimen other than nasopharyngeal swab, presence of viral mutation(s) within the areas targeted by this assay, and inadequate number of viral copies(<138 copies/mL). A negative result must be combined with clinical observations, patient history, and  epidemiological information. The expected result is Negative.  Fact Sheet for Patients:  EntrepreneurPulse.com.au  Fact Sheet for Healthcare Providers:  IncredibleEmployment.be  This test is no t yet approved or cleared by the [place] FDA and  has been authorized for detection and/or diagnosis of SARS-CoV-2 by FDA under an Emergency Use Authorization (EUA). This EUA will remain  in effect (meaning this test can be used) for the duration of the COVID-19 declaration under Section 564(b)(1) of the Act, 21 U.S.C.section 360bbb-3(b)(1), unless the authorization is terminated  or revoked sooner.       Influenza A by PCR NEGATIVE NEGATIVE Final   Influenza B by PCR NEGATIVE NEGATIVE Final    Comment: (NOTE) The Xpert Xpress SARS-CoV-2/FLU/RSV plus assay is intended as an aid in the diagnosis of influenza from Nasopharyngeal swab specimens and should not be used as a sole basis for treatment. Nasal washings and aspirates are unacceptable for Xpert Xpress SARS-CoV-2/FLU/RSV testing.  Fact Sheet for Patients: EntrepreneurPulse.com.au  Fact Sheet for Healthcare Providers: IncredibleEmployment.be  This test is not yet approved or cleared by the [place] FDA and has  been authorized for detection and/or diagnosis of SARS-CoV-2 by FDA under an Emergency Use Authorization (EUA). This EUA will remain in effect (meaning this test can be used) for the duration of the COVID-19 declaration under Section 564(b)(1) of the Act, 21 U.S.C. section 360bbb-3(b)(1), unless the authorization is terminated or revoked.  Performed at [facility], [place] [address] [place], [place] [address]   Aerobic Culture w Gram Stain (superficial specimen)     Status: None (Preliminary result)   Collection Time: 08/11/21  5:10 PM   Specimen: Wound  Result Value Ref Range Status   Specimen Description WOUND RIGHT TOE  Final   Special Requests RT BIG TOE  Final   Gram Stain   Final    FEW FEW GRAM POSITIVE COCCI FEW GRAM POSITIVE RODS RARE GRAM NEGATIVE RODS RARE GRAM NEGATIVE COCCOBACILLI    Culture   Final    FEW STAPHYLOCOCCUS AUREUS SUSCEPTIBILITIES TO FOLLOW Performed at [facility], [place] [address] [place],  [address]    Report Status PENDING  Incomplete         Radiology Studies: No results found.      Scheduled Meds:  sodium chloride   Intravenous Once   Chlorhexidine Gluconate Cloth  6 each Topical Q0600   cinacalcet  90 mg Oral Daily   doxercalciferol  9 mcg Intravenous Q T,Th,Sa-HD   heparin  5,000 Units Subcutaneous Q8H   insulin aspart  0-6 Units Subcutaneous TID WC & HS   insulin glargine-yfgn  20 Units Subcutaneous QHS   LORazepam  1 mg Intravenous Once   losartan  50 mg Oral Daily   multivitamin  1 tablet Oral QHS   sevelamer carbonate  0.8 g Oral TID WC   sodium chloride flush  3 mL Intravenous Q12H   sodium chloride flush  3 mL Intravenous Q12H   Continuous Infusions:  sodium chloride     sodium chloride     ciprofloxacin 400 mg (08/13/21 2224)   vancomycin Stopped (08/12/21 1755)     LOS: 3 days    Time spent: 28 minutes    Sharen Hones, MD [facility]   To contact the attending provider between  7A-7P or the covering provider during after hours 7P-7A, please log into the web site [email or web address] and access using universal [place] password for that web site. If  you do not have the password, please call the hospital operator.  08/14/2021, 11:00 AM

## 2021-08-14 NOTE — Progress Notes (Signed)
°  Transition of Care [facility]) Screening Note   Patient Details  Name: [name] Date of Birth: 1981-07-01   Transition of Care [facility]) CM/SW Contact:    Dawayne Patricia, RN Phone Number: 08/14/2021, 2:20 PM    Transition of Care [facility]) has reviewed patient and no TOC needs have been identified at this time. We will continue to monitor patient advancement through interdisciplinary progression rounds. If new patient transition needs arise, please place a TOC consult.

## 2021-08-14 NOTE — Progress Notes (Signed)
Pt refused dressing-change on her right foot. Education given about the moist to dry dressing necessity. She also refused staff nurse to check her pulses with doppler tonight. Pt stated she prefers MD to do it and also to change the dressing  at am.  Pt is fully alert, oriented x 4 and appearing agitated with nursing care after she woke up. She was more sleepy on day shift per RN day shift reported. Her husband called and requested staff to bring more food for Pt. He concerned that Pt has been sleepy all day since post surgery. Update was given. All questions were answered. He expressed appreciations. Snacks and [place] sandwich were offered for Pt. She was able to eat and finish them all.   2+ pitting edema on right arm. MD made aware per RN day shift reported. Palpation right AV fistular graft is thrill and bruit, no drainage or hematoma. Her vital sign are stable. Remains afebrile. Pt tolerated pain well. No acute distress. We continue to monitor.  Kennyth Lose, RN

## 2021-08-14 NOTE — Progress Notes (Addendum)
Inpatient Diabetes Program Recommendations  AACE/ADA: New Consensus Statement on Inpatient Glycemic Control (2015)  Target Ranges:  Prepandial:   less than 140 mg/dL      Peak postprandial:   less than 180 mg/dL (1-2 hours)      Critically ill patients:  140 - 180 mg/dL   Lab Results  Component Value Date   GLUCAP 273 (H) 08/14/2021   HGBA1C 10.4 (H) 08/11/2021    Review of Glycemic Control  Latest Reference Range & Units 08/13/21 06:14 08/13/21 10:52 08/13/21 15:43 08/13/21 21:46 08/14/21 08:04 08/14/21 11:50  Glucose-Capillary 70 - 99 mg/dL 273 (H) 251 (H) 275 (H) 227 (H) 299 (H) 273 (H)  (H): Data is abnormally high  Diabetes history: DM2, ESRD Outpatient Diabetes medications: Lantus 20 QD, Novolog 5 units TID Current orders for Inpatient glycemic control: Semglee 20 units QD, Novolog 0-6 units TID & HS  Inpatient Diabetes Program Recommendations:    Novolog 0-9 units TID and HS Novolog 3 units TID with meals if eats at least 50% Semglee 22 units QHS  Addendum@14 :11: Spoke with patient at bedside. She has not been taking any insulin for a few weeks.  She confirms she should be taking the above home insulins.  When asked why she hasn't been taking her insulin she says with a flat expression, "I didn't have any".  She she is able to get to the pharmacy but didn't go.  When asked why she states, "I don't know".  She lives with her husband.    She states she does not drink beverages with sugar and understands importance of limiting CHO's.  She is aware of which foods contain CHO's.  Discussed long and short term complication of uncontrolled blood sugars.  Discussed importance of good glucose control for optimal healing.  She admits to having lows and is aware how to treat.    Reviewed patient's current A1c of 10.4% (average blood sugar of 251).  She does have ESRD and is on HD.  Explained what a A1c is and what it measures. Also reviewed goal A1c with patient, importance of good  glucose control @ home, and blood sugar goals.  She states she wil lstart taking her insulins when she is discharged.     She will need a prescription for a glucometer at [facility].  Order # [id number]  Lantus Solostar Insulin Pen order # [id number] Novolog FlexPen order # [id number] Insulin pen needles order # [id number]    Will continue to follow while inpatient.  Thank you, Reche Dixon, MSN, RN Diabetes Coordinator Inpatient Diabetes Program [phone] (team pager from 8a-5p)

## 2021-08-14 NOTE — Progress Notes (Addendum)
°  Progress Note    08/14/2021 7:05 AM 3 Days Post-Op  Subjective:  appears very sleepy but awakes and answers questions.  Says her dressing was not changed this morning.  C/o pain in her foot.  Says she hungry a couple of times.    Afebrile   Vitals:   08/13/21 1557 08/14/21 0050  BP: 125/67 94/67  Pulse:  (!) 58  Resp:  12  Temp:  97.8 F (36.6 C)  SpO2:  98%    Physical Exam: General:  resting comfortably. Lungs:  non labored Wound:     CBC    Component Value Date/Time   WBC 14.6 (H) 08/14/2021 0445   RBC 3.30 (L) 08/14/2021 0445   HGB 9.7 (L) 08/14/2021 0445   HGB 10.6 (L) 05/18/2020 0927   HCT 29.7 (L) 08/14/2021 0445   HCT 35.1 05/18/2020 0927   PLT 244 08/14/2021 0445   PLT 186 05/18/2020 0927   MCV 90.0 08/14/2021 0445   MCV 89 05/18/2020 0927   MCH 29.4 08/14/2021 0445   MCHC 32.7 08/14/2021 0445   RDW 17.0 (H) 08/14/2021 0445   RDW 13.9 05/18/2020 0927   LYMPHSABS 1.6 08/14/2021 0445   LYMPHSABS 1.6 05/18/2020 0927   MONOABS 0.9 08/14/2021 0445   EOSABS 0.6 (H) 08/14/2021 0445   EOSABS 0.6 (H) 05/18/2020 0927   BASOSABS 0.0 08/14/2021 0445   BASOSABS 0.0 05/18/2020 0927    BMET    Component Value Date/Time   NA 132 (L) 08/12/2021 1123   NA 133 (L) 05/18/2020 0927   K 4.1 08/12/2021 1123   CL 90 (L) 08/12/2021 1123   CO2 24 08/12/2021 1123   GLUCOSE 218 (H) 08/12/2021 1123   BUN 79 (H) 08/12/2021 1123   BUN 45 (H) 05/18/2020 0927   CREATININE 14.26 (H) 08/12/2021 1123   CALCIUM 9.6 08/12/2021 1123   GFRNONAA 3 (L) 08/12/2021 1123   GFRAA 6 (L) 05/18/2020 0927    INR    Component Value Date/Time   INR 1.2 08/10/2021 2212     Intake/Output Summary (Last 24 hours) at 08/14/2021 7741 Last data filed at 08/13/2021 2200 Gross per 24 hour  Intake 930 ml  Output 1 ml  Net 929 ml     Assessment/Plan:  41 y.o. female is s/p:  Open right TMA  3 Days Post-Op   -dressing changed this morning.  She continues to have a brisk right DP  doppler signal.  Pain with dressing change but she tolerated well.  Wound as above-no evidence of infection.  Continue wet to dry dressing changes.  -heel weight bearing only with Darco shoe.     [name], PA-C [facility] [phone] 08/14/2021 7:05 AM  I have seen and evaluated the patient. I agree with the PA note as documented above.  Open TMA continues to look good.  Has tolerated wet-to-dry dressing changes all weekend.  I have looked at culture data from the [facility] and discussed with pharmacy and will de-escalate clindamycin with plans to continue with Vanc and Cipro.  White count still 14.  Likely delayed primary closure sometime this week.  Marty Heck, MD [facility] Office: [phone]

## 2021-08-15 ENCOUNTER — Encounter (HOSPITAL_COMMUNITY): Payer: Self-pay | Admitting: Surgery

## 2021-08-15 DIAGNOSIS — E1152 Type 2 diabetes mellitus with diabetic peripheral angiopathy with gangrene: Secondary | ICD-10-CM

## 2021-08-15 DIAGNOSIS — E1165 Type 2 diabetes mellitus with hyperglycemia: Secondary | ICD-10-CM

## 2021-08-15 DIAGNOSIS — N186 End stage renal disease: Secondary | ICD-10-CM | POA: Diagnosis not present

## 2021-08-15 DIAGNOSIS — D638 Anemia in other chronic diseases classified elsewhere: Secondary | ICD-10-CM | POA: Diagnosis not present

## 2021-08-15 DIAGNOSIS — I96 Gangrene, not elsewhere classified: Secondary | ICD-10-CM | POA: Diagnosis not present

## 2021-08-15 LAB — GLUCOSE, CAPILLARY
Glucose-Capillary: 119 mg/dL — ABNORMAL HIGH (ref 70–99)
Glucose-Capillary: 218 mg/dL — ABNORMAL HIGH (ref 70–99)
Glucose-Capillary: 184 mg/dL — ABNORMAL HIGH (ref 70–99)
Glucose-Capillary: 220 mg/dL — ABNORMAL HIGH (ref 70–99)
Glucose-Capillary: 209 mg/dL — ABNORMAL HIGH (ref 70–99)

## 2021-08-15 LAB — CULTURE, BLOOD (ROUTINE X 2)
Culture: NO GROWTH
Special Requests: ADEQUATE

## 2021-08-15 LAB — RENAL FUNCTION PANEL
Albumin: 2 g/dL — ABNORMAL LOW (ref 3.5–5.0)
Anion gap: 19 — ABNORMAL HIGH (ref 5–15)
BUN: 68 mg/dL — ABNORMAL HIGH (ref 6–20)
CO2: 21 mmol/L — ABNORMAL LOW (ref 22–32)
Calcium: 9.4 mg/dL (ref 8.9–10.3)
Chloride: 95 mmol/L — ABNORMAL LOW (ref 98–111)
Creatinine, Ser: 13.83 mg/dL — ABNORMAL HIGH (ref 0.44–1.00)
GFR, Estimated: 3 mL/min — ABNORMAL LOW (ref >60)
Glucose, Bld: 216 mg/dL — ABNORMAL HIGH (ref 70–99)
Phosphorus: 9.3 mg/dL — ABNORMAL HIGH (ref 2.5–4.6)
Potassium: 4.2 mmol/L (ref 3.5–5.1)
Sodium: 135 mmol/L (ref 135–145)

## 2021-08-15 MED ORDER — INSULIN GLARGINE-YFGN 100 UNIT/ML ~~LOC~~ SOLN
26.0000 [IU] | Freq: Every day | SUBCUTANEOUS | Status: DC
  Administered 2021-08-15 – 2021-08-16 (×2): 26 [IU] via SUBCUTANEOUS
  Filled 2021-08-15 (×3): qty 0.26

## 2021-08-15 MED ORDER — SEVELAMER CARBONATE 0.8 G PO PACK
1.6000 g | PACK | Freq: Three times a day (TID) | ORAL | Status: DC
  Filled 2021-08-15: qty 2

## 2021-08-15 MED ORDER — SEVELAMER CARBONATE 0.8 G PO PACK
1.6000 g | PACK | Freq: Three times a day (TID) | ORAL | Status: DC
  Administered 2021-08-15 – 2021-08-25 (×13): 1.6 g via ORAL
  Filled 2021-08-15 (×33): qty 2

## 2021-08-15 NOTE — Progress Notes (Signed)
[facility] Progress Note  Subjective: Last HD on 12/31 with 2.5 kg UF.   Per vascular charting plan to return to OR on 08/16/21 for partial closure.  She states arm a little swollen since after last HD treatment she thinks.   Review of systems:  Denies shortness of breath or chest pain Reports nausea no vomiting   Vitals:   08/15/21 0029 08/15/21 0345 08/15/21 0347 08/15/21 0348  BP: (!) 160/73 (!) 165/76 (!) 165/76 (!) 162/75  Pulse: 82 84 93 86  Resp: 16 16 16 13   Temp: 98.4 F (36.9 C) 98.2 F (36.8 C)    TempSrc: Oral Oral    SpO2: 95% 94% 96% 96%  Weight:  67.6 kg    Height:        Exam:   General adult female in bed in no acute distress HEENT normocephalic atraumatic Neck supple trachea midline Lungs clear to auscultation bilaterally normal work of breathing at rest on room air Heart S1S2 no rub Abdomen soft nontender nondistended Extremities no edema appreciated lower extremities; right foot is wrapped Psych normal mood and affect Neuro - more awake today; alert and oriented x 3  Access - RUE AVF with bruit and thrill    OP HD: [place] [facility]   3h 34min  400/1.5  68.2kg  2/2 bath  15g  Hep 4000  AVF  - mircera 243mcg IV q 2 weeks- last dose 12/27  - hectorol 9 mcg IV q HD   - sensipar 90mg  2 tabs po QHS   - home meds > amlodipine 10mg  QD, renvela 2.4g 1 ac tid  Assessment/ Plan: R great toe diabetic infection: s/p angiogram and 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  IV antibiotics per primary team.  Please notify nephrology if antibiotics needed with outpatient HD.  See six weeks of abx planned per charting - we can give vanc with HD outpatient but cannot do IV cipro outpatient with HD as is daily.  Unsure if planned to transition to PO cipro.  Defer to team.  Please confirm end date for abx if needed with outpatient HD as well.   ESRD:  HD per TTS schedule.   Hypertension/volume: optimize volume with HD  Anemia CKD and blood loss: outpatient HD notes reveal pt had noticed red  blood in her stools and had been referred to GI outpatient. Note pt got 2 units PRBC's for Hb 6.6 on 12/31 per charting.  Next esa due on 08/22/21. CBC in AM  Metabolic bone disease: hyperphosphatemia - for HD.  On renvela packets - increase dose to 1.6 gram TID. Hypercalcemia when corrected for albumin.  stopped high dose hectorol. We restarted sensipar here.  T2DM: hyperglycemic on admit,  had not been taking her insulin at home. Management per primary team.   Disposition - per primary team and vascular   Recent Labs  Lab 08/13/21 0856 08/14/21 0445 08/15/21 0546  K  --  3.6 4.2  BUN  --  59* 68*  CREATININE  --  11.60* 13.83*  CALCIUM  --  9.3 9.4  PHOS  --  8.3* 9.3*  HGB 10.1* 9.7*  --    Inpatient medications:  sodium chloride   Intravenous Once   Chlorhexidine Gluconate Cloth  6 each Topical Q0600   Chlorhexidine Gluconate Cloth  6 each Topical Q0600   cinacalcet  90 mg Oral Daily   heparin  5,000 Units Subcutaneous Q8H   insulin aspart  0-6 Units Subcutaneous TID WC & HS  insulin glargine-yfgn  20 Units Subcutaneous QHS   LORazepam  1 mg Intravenous Once   losartan  50 mg Oral Daily   multivitamin  1 tablet Oral QHS   sevelamer carbonate  0.8 g Oral TID WC   sodium chloride flush  3 mL Intravenous Q12H   sodium chloride flush  3 mL Intravenous Q12H    sodium chloride     sodium chloride 500 mL (08/14/21 2116)   ciprofloxacin 200 mL/hr at 08/14/21 2213   vancomycin Stopped (08/12/21 1755)   sodium chloride, sodium chloride, acetaminophen **OR** acetaminophen, diphenhydrAMINE, fentaNYL (SUBLIMAZE) injection, hydrALAZINE, labetalol, naLOXone (NARCAN)  injection, ondansetron (ZOFRAN) IV, oxyCODONE, senna-docusate, sodium chloride flush, sodium chloride flush   Claudia Desanctis, MD 08/15/2021 8:24 AM

## 2021-08-15 NOTE — Progress Notes (Signed)
Pharmacy Antibiotic Note  [name] is a 41 y.o. female admitted on 08/10/2021 with R foot cellulitis/gangrene  Pharmacy has been consulted for Vancomycin dosing.  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 WBC 14.6 up. ESRD dosing  Cipro 12/30 >> Clinda 12/30 >>1/2 Vanc 12/30 >>   12/29 bcx: MRSE x 1 12/30 wound cx: MSSA  Plan: Vancomycin 750 mg IV after each HD Cipro 400mg  IV q24h Plan for return to OR 08/16/21 for partial closure  Discussed with Dr. Carlis Abbott and PA on 1/2. Pt was so infected initially they want 6 wks of abx. Source removed. Growing MSSA so could narrow Abx to Ancef alone (tolerated in 2020).       Height: 5\' 1"  (154.9 cm) Weight: 67.6 kg (149 lb 0.5 oz) IBW/kg (Calculated) : 47.8  Temp (24hrs), Avg:98.4 F (36.9 C), Min:98.2 F (36.8 C), Max:98.6 F (37 C)  Recent Labs  Lab 08/10/21 2212 08/11/21 0415 08/12/21 1123 08/13/21 0856 08/14/21 0445 08/15/21 0546  WBC 15.5* 15.0* 10.6* 12.9* 14.6*  --   CREATININE 12.56* 12.70* 14.26*  --  11.60* 13.83*  LATICACIDVEN 1.5  --   --   --   --   --     Estimated Creatinine Clearance: 4.8 mL/min (A) (by C-G formula based on SCr of 13.83 mg/dL (H)).    Allergies  Allergen Reactions   Morphine Rash and Other (See Comments)   Peanut-Containing Drug Products Anaphylaxis and Hives   Penicillins Anaphylaxis   Chocolate Hives    Marianita Botkin S. Alford Highland, PharmD, BCPS Clinical Staff Pharmacist [email or web address]  Wayland Salinas 08/15/2021 8:21 AM

## 2021-08-15 NOTE — Progress Notes (Signed)
Hemodialysis- Tolerated well. Goal 2.5L met without issue. Access arm remains slightly swollen. Instructed patient to elevate on pillow when she can. Pain was issue. Reported off to [facility].

## 2021-08-15 NOTE — Evaluation (Signed)
Physical Therapy Evaluation Patient Details Name: [name] MRN: [id number] DOB: 1981-03-04 Today's Date: 08/15/2021  History of Present Illness  41 year old with history of type 2 diabetes on insulin, ESRD on hemodialysis TTS schedule, neuropathy, hypertension, R eye blindness, with poor compliance came to the emergency room with right foot pain and infection.  She was found to have gangrenous toes.  Underwent angiogram and urgent transmetatarsal amputation.  Clinical Impression  Patient presents with decreased mobility due to pain R foot, decreased activity tolerance, decreased balance, poor safety awareness and high risk for falls.  Was independent prior to admission.  Currently with difficulty maintaining heel weight bearing on R foot and with step pivot transfer to recliner.  Tolerated squat pivot back to bed better using bed rail.  She is not interested in SNF level rehab, but might benefit from acute inpatient rehab if she improves her participation.  Currently recommending home if spouse and children able to stay with her to provide hands on assist for transfers, hospital bed to allow her to stay on main level, BSC, wheelchair and walker with follow up HHPT.  She would need assist to get out to [facility] for dialysis transport as well.  PT will continue to follow acutely.  Adding OT consult for further mobility/suggestions.      Recommendations for follow up therapy are one component of a multi-disciplinary discharge planning process, led by the attending physician.  Recommendations may be updated based on patient status, additional functional criteria and insurance authorization.  Follow Up Recommendations Home health PT    Assistance Recommended at Discharge Frequent or constant Supervision/Assistance  Patient can return home with the following  Assist for transportation;A lot of help with walking and/or transfers;Other (comment) (DME to allow pt to stay on main level)    Equipment  Recommendations Wheelchair (measurements PT);Wheelchair cushion (measurements PT);Rolling walker (2 wheels);BSC/3in1;Hospital bed  Recommendations for Other Services       Functional Status Assessment Patient has had a recent decline in their functional status and demonstrates the ability to make significant improvements in function in a reasonable and predictable amount of time.     Precautions / Restrictions Precautions Precautions: Fall Required Braces or Orthoses: Other Brace Other Brace: Darco shoe R LE Restrictions RLE Weight Bearing: Partial weight bearing RLE Partial Weight Bearing Percentage or Pounds: on heel only in heel wedge Darco      Mobility  Bed Mobility Overal bed mobility: Needs Assistance Bed Mobility: Supine to Sit;Sit to Supine     Supine to sit: HOB elevated;Min assist Sit to supine: Supervision   General bed mobility comments: assist for moving legs off bed for orientation due to limited vision; to supine mod cues for positioning with head at [facility] as pt attempting to place feet on pillow at [facility] initially    Transfers Overall transfer level: Needs assistance Equipment used: Rolling walker (2 wheels);None Transfers: Sit to/from Stand;Bed to chair/wheelchair/BSC Sit to Stand: Mod assist;From elevated surface   Step pivot transfers: Max assist Squat pivot transfers: Mod assist     General transfer comment: attempted up to [facility] initially, but pt unable to perform due to limited vision and confused about concept stating she cannot move the [place] out of her way,  So used walker and pt stood from EOB with some lifting help from higher surface, then took 2 pivoting steps, but with pain unable to complete step transfer so assisted to move hips down to R to land in chair, then back  to bed used bed rail for squat pivot with mod cues for orientation and min to mod A for safety    Ambulation/Gait               General Gait Details: unable  Stairs             Wheelchair Mobility    Modified Rankin (Stroke Patients Only)       Balance Overall balance assessment: Needs assistance   Sitting balance-Leahy Scale: Good Sitting balance - Comments: sitting completing lateral leans due to scratching her hips and back side   Standing balance support: Bilateral upper extremity supported Standing balance-Leahy Scale: Poor Standing balance comment: flexed and heavily reliant on walker for support due to pain R LE                             Pertinent Vitals/Pain Pain Score: 7  Pain Location: Right foot Pain Descriptors / Indicators: Sore Pain Intervention(s): Monitored during session;Repositioned;Limited activity within patient's tolerance    Home Living Family/patient expects to be discharged to:: Private residence Living Arrangements: Spouse/significant other;Children Available Help at Discharge: Family Type of Home: House Home Access: Level entry     Alternate Level Stairs-Number of Steps: 1 flight Home Layout: Two level;Bed/bath upstairs Home Equipment: None Additional Comments: reports spouse will stay with her initially    Prior Function Prior Level of Function : Independent/Modified Independent             Mobility Comments: Does not drive, no IADLs.  [facility] to dialysis       Hand Dominance   Dominant Hand: Left    Extremity/Trunk Assessment   Upper Extremity Assessment Upper Extremity Assessment: RUE deficits/detail RUE Deficits / Details: edema in arm with fistula but moves okay and no c/o pain    Lower Extremity Assessment Lower Extremity Assessment: RLE deficits/detail RLE Deficits / Details: pain with any pressure, unable to mobilize ankle to allow for only heel weight bearing RLE: Unable to fully assess due to pain       Communication   Communication: No difficulties  Cognition Arousal/Alertness: Awake/alert Behavior During Therapy: WFL for tasks assessed/performed Overall Cognitive  Status: No family/caregiver present to determine baseline cognitive functioning                                 General Comments: some decreased insight, A&O x 4 but sleepy yesterday and not amenable to mobility despite need for moving to get home        General Comments General comments (skin integrity, edema, etc.): assisted to wash her back while in recliner, then changed sheets and assisted back to bed as for HD today; discussed options including SNF and home with hosptial bed, wheelchair and spouse assist.    Exercises Other Exercises Other Exercises: attempted to teach ankle pumps on R but pt states too painful with foot on the floor   Assessment/Plan    PT Assessment Patient needs continued PT services  PT Problem List Decreased strength;Decreased mobility;Decreased safety awareness;Decreased range of motion;Decreased activity tolerance;Decreased balance;Decreased cognition;Pain;Decreased knowledge of use of DME       PT Treatment Interventions DME instruction;Therapeutic activities;Gait training;Therapeutic exercise;Patient/family education;Wheelchair mobility training;Functional mobility training;Balance training    PT Goals (Current goals can be found in the Care Plan section)  Acute Rehab PT Goals Patient Stated Goal: to go home PT  Goal Formulation: With patient Time For Goal Achievement: 08/29/21 Potential to Achieve Goals: Fair    Frequency Min 3X/week     Co-evaluation               AM-PAC PT "6 Clicks" Mobility  Outcome Measure Help needed turning from your back to your side while in a flat bed without using bedrails?: None Help needed moving from lying on your back to sitting on the side of a flat bed without using bedrails?: None Help needed moving to and from a bed to a chair (including a wheelchair)?: A Lot Help needed standing up from a chair using your arms (e.g., wheelchair or bedside chair)?: A Lot Help needed to walk in hospital  room?: Total Help needed climbing 3-5 steps with a railing? : Total 6 Click Score: 14    End of Session Equipment Utilized During Treatment: Gait belt Activity Tolerance: Patient limited by pain Patient left: in bed;with call bell/phone within reach   PT Visit Diagnosis: Other abnormalities of gait and mobility (R26.89);Muscle weakness (generalized) (M62.81);Pain Pain - Right/Left: Right Pain - part of body: Ankle and joints of foot    Time: 1000-1030 PT Time Calculation (min) (ACUTE ONLY): 30 min   Charges:   PT Evaluation $PT Eval Moderate Complexity: 1 Mod PT Treatments $Therapeutic Activity: 8-22 mins        Magda Kiel, PT Acute Rehabilitation Services [id number]:[phone] Office:[phone] 08/15/2021   [name] 08/15/2021, 11:02 AM

## 2021-08-15 NOTE — Progress Notes (Addendum)
[facility]  Subjective  - Not resting well   Objective (!) 162/75 86 98.2 F (36.8 C) (Oral) 13 96%  Intake/Output Summary (Last 24 hours) at 08/15/2021 0719 Last data filed at 08/15/2021 0300 Gross per 24 hour  Intake 650 ml  Output --  Net 650 ml    Right open TMA wet to dry dressing changed at bed side.  No change in wound appearence compared to picture 08/14/21.  No active drainage or malodor.   Doppler DP/PT brisk Lungs non labored breathing  Assessment/Planning: POD #4 41 y.o. female is s/p:  Open right TMA   Good inflow to assist with healing Plan for return to OR 08/16/21 for partial closure NPO past midnight Plan for antibiotics for 6 weeks Continue Lucianne Lei and Cipro   Roxy Horseman 08/15/2021 7:19 AM --  Laboratory Lab Results: Recent Labs    08/13/21 0856 08/14/21 0445  WBC 12.9* 14.6*  HGB 10.1* 9.7*  HCT 31.2* 29.7*  PLT 229 244   BMET Recent Labs    08/14/21 0445 08/15/21 0546  NA 131* 135  K 3.6 4.2  CL 90* 95*  CO2 24 21*  GLUCOSE 316* 216*  BUN 59* 68*  CREATININE 11.60* 13.83*  CALCIUM 9.3 9.4    COAG Lab Results  Component Value Date   INR 1.2 08/10/2021   INR 1.1 06/30/2020   No results found for: PTT  I agree with the above.  I have seen and evaluated the patient.  I am planning on possible partial closure of her transmetatarsal amputation in the operating room tomorrow.  She will need to be n.p.o. after midnight.  I would like for her to have at minimum 6 weeks of antibiotics.  [name]

## 2021-08-15 NOTE — H&P (View-Only) (Signed)
[facility]   Subjective  - Not resting well   Objective (!) 162/75 86 98.2 F (36.8 C) (Oral) 13 96%  Intake/Output Summary (Last 24 hours) at 08/15/2021 0719 Last data filed at 08/15/2021 0300 Gross per 24 hour  Intake 650 ml  Output --  Net 650 ml    Right open TMA wet to dry dressing changed at bed side.  No change in wound appearence compared to picture 08/14/21.  No active drainage or malodor.   Doppler DP/PT brisk Lungs non labored breathing  Assessment/Planning: POD #4 41 y.o. female is s/p:  Open right TMA   Good inflow to assist with healing Plan for return to OR 08/16/21 for partial closure NPO past midnight Plan for antibiotics for 6 weeks Continue Lucianne Lei and Cipro   Roxy Horseman 08/15/2021 7:19 AM --  Laboratory Lab Results: Recent Labs    08/13/21 0856 08/14/21 0445  WBC 12.9* 14.6*  HGB 10.1* 9.7*  HCT 31.2* 29.7*  PLT 229 244   BMET Recent Labs    08/14/21 0445 08/15/21 0546  NA 131* 135  K 3.6 4.2  CL 90* 95*  CO2 24 21*  GLUCOSE 316* 216*  BUN 59* 68*  CREATININE 11.60* 13.83*  CALCIUM 9.3 9.4    COAG Lab Results  Component Value Date   INR 1.2 08/10/2021   INR 1.1 06/30/2020   No results found for: PTT  I agree with the above.  I have seen and evaluated the patient.  I am planning on possible partial closure of her transmetatarsal amputation in the operating room tomorrow.  She will need to be n.p.o. after midnight.  I would like for her to have at minimum 6 weeks of antibiotics.  [name]

## 2021-08-15 NOTE — Progress Notes (Signed)
PROGRESS NOTE    [name]  IFO:[id number] DOB: 1980/12/26 DOA: 08/10/2021 PCP: [facility]    Brief Narrative:  41 year old with history of type 2 diabetes on insulin, ESRD on hemodialysis TTS schedule, neuropathy, hypertension with poor compliance came to the emergency room with right foot pain and infection.  She was found to have gangrenous toes.  Underwent angiogram and urgent transmetatarsal amputation.   Assessment & Plan:   Principal Problem:   Gangrene of right foot (HCC) Active Problems:   Anemia of chronic disease   ESRD on hemodialysis ([place])   Uncontrolled type 2 diabetes mellitus with hyperglycemia (HCC)   Hyponatremia  Diabetic foot infection, osteomyelitis and gangrene of the right toes: Status post partial right foot amputation. Blood culture in 1 bottle was positive for staph epidermis, most likely contaminant. Wound culture still pending with staph aureus and multiple other bacteria.   Due to severity of wound infection and osteomyelitis, patient will need 6 weeks antibiotics with IV vancomycin and potential oral Cipro Continue current antibiotics. Wound closure tomorrow.   End-stage renal disease Anemia of chronic kidney disease. Status post 2 units of PRBC. Continue hemodialysis per nephrology, hemoglobin has been stable since transfusion.  Uncontrolled type 2 diabetes with hyperglycemia. Increase insulin glargine dose.       DVT prophylaxis: Heparin Code Status: full Family Communication:  Disposition Plan:      Status is: Inpatient   Remains inpatient appropriate because: Severity of disease and IV antibiotics.         I/O last 3 completed shifts: In: 45 [P.O.:490; IV Piggyback:400] Out: -  No intake/output data recorded.     Consultants:  Vascular surgery, nephrology.  Procedures: Partial foot amputation.  Antimicrobials: Vancomycin and Cipro.  Subjective: Patient doing well today, no fever  chills. Denies any short of breath or cough. No abdominal pain or nausea vomiting.  Objective: Vitals:   08/15/21 0345 08/15/21 0347 08/15/21 0348 08/15/21 0804  BP: (!) 165/76 (!) 165/76 (!) 162/75 (!) 162/76  Pulse: 84 93 86 90  Resp: 16 16 13 14   Temp: 98.2 F (36.8 C)   98.3 F (36.8 C)  TempSrc: Oral   Oral  SpO2: 94% 96% 96% 97%  Weight: 67.6 kg     Height:        Intake/Output Summary (Last 24 hours) at 08/15/2021 1038 Last data filed at 08/15/2021 0300 Gross per 24 hour  Intake 650 ml  Output --  Net 650 ml   Filed Weights   08/12/21 1752 08/13/21 0344 08/15/21 0345  Weight: 68 kg 69.3 kg 67.6 kg    Examination:  General exam: Appears calm and comfortable  Respiratory system: Clear to auscultation. Respiratory effort normal. Cardiovascular system: S1 & S2 heard, RRR. No JVD, murmurs, rubs, gallops or clicks. No pedal edema. Gastrointestinal system: Abdomen is nondistended, soft and nontender. No organomegaly or masses felt. Normal bowel sounds heard. Central nervous system: Alert and oriented. No focal neurological deficits. Extremities: Symmetric 5 x 5 power. Skin: No rashes, lesions or ulcers Psychiatry: Judgement and insight appear normal. Mood & affect appropriate.     Data Reviewed: I have personally reviewed following labs and imaging studies  CBC: Recent Labs  Lab 08/10/21 2212 08/11/21 0415 08/12/21 1123 08/13/21 0856 08/14/21 0445  WBC 15.5* 15.0* 10.6* 12.9* 14.6*  NEUTROABS 13.4*  --   --   --  11.3*  HGB 7.3* 7.2* 6.6* 10.1* 9.7*  HCT 24.2* 23.4* 20.9*  31.2* 29.7*  MCV 93.4 94.7 91.7 90.2 90.0  PLT 348 291 259 229 627   Basic Metabolic Panel: Recent Labs  Lab 08/10/21 2212 08/11/21 0415 08/12/21 1123 08/14/21 0445 08/15/21 0546  NA 130* 129* 132* 131* 135  K 4.0 4.3 4.1 3.6 4.2  CL 88* 89* 90* 90* 95*  CO2 24 22 24 24  21*  GLUCOSE 473* 522* 218* 316* 216*  BUN 67* 71* 79* 59* 68*  CREATININE 12.56* 12.70* 14.26* 11.60* 13.83*   CALCIUM 9.5 9.0 9.6 9.3 9.4  PHOS  --   --  9.1* 8.3* 9.3*   GFR: Estimated Creatinine Clearance: 4.8 mL/min (A) (by C-G formula based on SCr of 13.83 mg/dL (H)). Liver Function Tests: Recent Labs  Lab 08/10/21 2212 08/12/21 1123 08/14/21 0445 08/15/21 0546  AST 149*  --   --   --   ALT 321*  --   --   --   ALKPHOS 101  --   --   --   BILITOT 0.8  --   --   --   PROT 8.3*  --   --   --   ALBUMIN 2.4* 2.0* 2.0* 2.0*   No results for input(s): LIPASE, AMYLASE in the last 168 hours. No results for input(s): AMMONIA in the last 168 hours. Coagulation Profile: Recent Labs  Lab 08/10/21 2212  INR 1.2   Cardiac Enzymes: No results for input(s): CKTOTAL, CKMB, CKMBINDEX, TROPONINI in the last 168 hours. BNP (last 3 results) No results for input(s): PROBNP in the last 8760 hours. HbA1C: No results for input(s): HGBA1C in the last 72 hours. CBG: Recent Labs  Lab 08/14/21 0804 08/14/21 1150 08/14/21 1717 08/14/21 2108 08/15/21 0600  GLUCAP 299* 273* 330* 275* 218*   Lipid Profile: No results for input(s): CHOL, HDL, LDLCALC, TRIG, CHOLHDL, LDLDIRECT in the last 72 hours. Thyroid Function Tests: No results for input(s): TSH, T4TOTAL, FREET4, T3FREE, THYROIDAB in the last 72 hours. Anemia Panel: Recent Labs    08/13/21 0856  VITAMINB12 1,560*  FERRITIN 1,196*  TIBC 155*  IRON 32   Sepsis Labs: Recent Labs  Lab 08/10/21 2212  LATICACIDVEN 1.5    Recent Results (from the past 240 hour(s))  Blood Culture (routine x 2)     Status: Abnormal   Collection Time: 08/10/21  9:56 PM   Specimen: BLOOD  Result Value Ref Range Status   Specimen Description BLOOD  Final   Special Requests   Final    BOTTLES DRAWN AEROBIC AND ANAEROBIC Blood Culture adequate volume   Culture  Setup Time   Final    GRAM POSITIVE COCCI AEROBIC BOTTLE ONLY CRITICAL RESULT CALLED TO, READ BACK BY AND VERIFIED WITH: PHARMD GREG ABBOTT 08/12/21@00 :32 BY TW    Culture (A)  Final     STAPHYLOCOCCUS EPIDERMIDIS THE SIGNIFICANCE OF ISOLATING THIS ORGANISM FROM A SINGLE SET OF BLOOD CULTURES WHEN MULTIPLE SETS ARE DRAWN IS UNCERTAIN. PLEASE NOTIFY THE MICROBIOLOGY DEPARTMENT WITHIN ONE WEEK IF SPECIATION AND SENSITIVITIES ARE REQUIRED. Performed at [facility], [place] [address], [place], [place] [address]    Report Status 08/14/2021 FINAL  Final  Blood Culture ID Panel (Reflexed)     Status: Abnormal   Collection Time: 08/10/21  9:56 PM  Result Value Ref Range Status   Enterococcus faecalis NOT DETECTED NOT DETECTED Final   Enterococcus Faecium NOT DETECTED NOT DETECTED Final   Listeria monocytogenes NOT DETECTED NOT DETECTED Final   Staphylococcus species DETECTED (A) NOT  DETECTED Final    Comment: CRITICAL RESULT CALLED TO, READ BACK BY AND VERIFIED WITH: PHARMD GREG ABBOTT 08/12/21@00 :32 BY TW    Staphylococcus aureus (BCID) NOT DETECTED NOT DETECTED Final   Staphylococcus epidermidis DETECTED (A) NOT DETECTED Final    Comment: Methicillin (oxacillin) resistant coagulase negative staphylococcus. Possible blood culture contaminant (unless isolated from more than one blood culture draw or clinical case suggests pathogenicity). No antibiotic treatment is indicated for blood  culture contaminants. CRITICAL RESULT CALLED TO, READ BACK BY AND VERIFIED WITH: PHARMD GREG ABBOTT 08/12/21@00 :32 BY TW    Staphylococcus lugdunensis NOT DETECTED NOT DETECTED Final   Streptococcus species NOT DETECTED NOT DETECTED Final   Streptococcus agalactiae NOT DETECTED NOT DETECTED Final   Streptococcus pneumoniae NOT DETECTED NOT DETECTED Final   Streptococcus pyogenes NOT DETECTED NOT DETECTED Final   A.calcoaceticus-baumannii NOT DETECTED NOT DETECTED Final   Bacteroides fragilis NOT DETECTED NOT DETECTED Final   Enterobacterales NOT DETECTED NOT DETECTED Final   Enterobacter cloacae complex NOT DETECTED NOT DETECTED Final   Escherichia coli NOT DETECTED NOT DETECTED Final    Klebsiella aerogenes NOT DETECTED NOT DETECTED Final   Klebsiella oxytoca NOT DETECTED NOT DETECTED Final   Klebsiella pneumoniae NOT DETECTED NOT DETECTED Final   Proteus species NOT DETECTED NOT DETECTED Final   Salmonella species NOT DETECTED NOT DETECTED Final   Serratia marcescens NOT DETECTED NOT DETECTED Final   Haemophilus influenzae NOT DETECTED NOT DETECTED Final   Neisseria meningitidis NOT DETECTED NOT DETECTED Final   Pseudomonas aeruginosa NOT DETECTED NOT DETECTED Final   Stenotrophomonas maltophilia NOT DETECTED NOT DETECTED Final   Candida albicans NOT DETECTED NOT DETECTED Final   Candida auris NOT DETECTED NOT DETECTED Final   Candida glabrata NOT DETECTED NOT DETECTED Final   Candida krusei NOT DETECTED NOT DETECTED Final   Candida parapsilosis NOT DETECTED NOT DETECTED Final   Candida tropicalis NOT DETECTED NOT DETECTED Final   Cryptococcus neoformans/gattii NOT DETECTED NOT DETECTED Final   Methicillin resistance mecA/C DETECTED (A) NOT DETECTED Final    Comment: CRITICAL RESULT CALLED TO, READ BACK BY AND VERIFIED WITH: PHARMD GREG ABBOTT 08/12/21@00 :32 BY TW Performed at [facility], [address], [place], [place] [address]   Blood Culture (routine x 2)     Status: None   Collection Time: 08/10/21 10:12 PM   Specimen: BLOOD LEFT HAND  Result Value Ref Range Status   Specimen Description BLOOD LEFT HAND  Final   Special Requests   Final    BOTTLES DRAWN AEROBIC AND ANAEROBIC Blood Culture adequate volume   Culture   Final    NO GROWTH 5 DAYS Performed at [facility], [place] [address] [place], [place] [address]    Report Status 08/15/2021 FINAL  Final  Resp Panel by RT-PCR (Flu A&B, Covid) Nasopharyngeal Swab     Status: None   Collection Time: 08/10/21 11:19 PM   Specimen: Nasopharyngeal Swab; Nasopharyngeal(NP) swabs in vial transport medium  Result Value Ref Range Status   SARS Coronavirus 2 by RT PCR NEGATIVE NEGATIVE Final    Comment:  (NOTE) SARS-CoV-2 target nucleic acids are NOT DETECTED.  The SARS-CoV-2 RNA is generally detectable in upper respiratory specimens during the acute phase of infection. The lowest concentration of SARS-CoV-2 viral copies this assay can detect is 138 copies/mL. A negative result does not preclude SARS-Cov-2 infection and should not be used as the sole basis for treatment or other patient management decisions. A negative result  may occur with  improper specimen collection/handling, submission of specimen other than nasopharyngeal swab, presence of viral mutation(s) within the areas targeted by this assay, and inadequate number of viral copies(<138 copies/mL). A negative result must be combined with clinical observations, patient history, and epidemiological information. The expected result is Negative.  Fact Sheet for Patients:  EntrepreneurPulse.com.au  Fact Sheet for Healthcare Providers:  IncredibleEmployment.be  This test is no t yet approved or cleared by the [place] FDA and  has been authorized for detection and/or diagnosis of SARS-CoV-2 by FDA under an Emergency Use Authorization (EUA). This EUA will remain  in effect (meaning this test can be used) for the duration of the COVID-19 declaration under Section 564(b)(1) of the Act, 21 U.S.C.section 360bbb-3(b)(1), unless the authorization is terminated  or revoked sooner.       Influenza A by PCR NEGATIVE NEGATIVE Final   Influenza B by PCR NEGATIVE NEGATIVE Final    Comment: (NOTE) The Xpert Xpress SARS-CoV-2/FLU/RSV plus assay is intended as an aid in the diagnosis of influenza from Nasopharyngeal swab specimens and should not be used as a sole basis for treatment. Nasal washings and aspirates are unacceptable for Xpert Xpress SARS-CoV-2/FLU/RSV testing.  Fact Sheet for Patients: EntrepreneurPulse.com.au  Fact Sheet for Healthcare  Providers: IncredibleEmployment.be  This test is not yet approved or cleared by the [place] FDA and has been authorized for detection and/or diagnosis of SARS-CoV-2 by FDA under an Emergency Use Authorization (EUA). This EUA will remain in effect (meaning this test can be used) for the duration of the COVID-19 declaration under Section 564(b)(1) of the Act, 21 U.S.C. section 360bbb-3(b)(1), unless the authorization is terminated or revoked.  Performed at [facility], [place] [address], [place], [place] [address]   Aerobic Culture w Gram Stain (superficial specimen)     Status: None   Collection Time: 08/11/21  5:10 PM   Specimen: Wound  Result Value Ref Range Status   Specimen Description WOUND RIGHT TOE  Final   Special Requests RT BIG TOE  Final   Gram Stain   Final    FEW FEW GRAM POSITIVE COCCI FEW GRAM POSITIVE RODS RARE GRAM NEGATIVE RODS RARE GRAM NEGATIVE COCCOBACILLI Performed at [facility], [place] [address], [place], [place] [address]    Culture FEW STAPHYLOCOCCUS AUREUS  Final   Report Status 08/14/2021 FINAL  Final   Organism ID, Bacteria STAPHYLOCOCCUS AUREUS  Final      Susceptibility   Staphylococcus aureus - MIC*    CIPROFLOXACIN <=0.5 SENSITIVE Sensitive     ERYTHROMYCIN >=8 RESISTANT Resistant     GENTAMICIN <=0.5 SENSITIVE Sensitive     OXACILLIN 0.5 SENSITIVE Sensitive     TETRACYCLINE <=1 SENSITIVE Sensitive     VANCOMYCIN <=0.5 SENSITIVE Sensitive     TRIMETH/SULFA <=10 SENSITIVE Sensitive     CLINDAMYCIN RESISTANT Resistant     RIFAMPIN <=0.5 SENSITIVE Sensitive     Inducible Clindamycin POSITIVE Resistant     * FEW STAPHYLOCOCCUS AUREUS         Radiology Studies: No results found.      Scheduled Meds:  sodium chloride   Intravenous Once   Chlorhexidine Gluconate Cloth  6 each Topical Q0600   Chlorhexidine Gluconate Cloth  6 each Topical Q0600   cinacalcet  90 mg Oral Daily   heparin  5,000 Units  Subcutaneous Q8H   insulin aspart  0-6 Units Subcutaneous TID WC & HS   insulin glargine-yfgn  20  Units Subcutaneous QHS   LORazepam  1 mg Intravenous Once   losartan  50 mg Oral Daily   multivitamin  1 tablet Oral QHS   sevelamer carbonate  1.6 g Oral TID WC   sodium chloride flush  3 mL Intravenous Q12H   sodium chloride flush  3 mL Intravenous Q12H   Continuous Infusions:  sodium chloride     sodium chloride 500 mL (08/14/21 2116)   ciprofloxacin 200 mL/hr at 08/14/21 2213   vancomycin Stopped (08/12/21 1755)     LOS: 4 days    Time spent: 28 minutes    Sharen Hones, MD [facility]   To contact the attending provider between 7A-7P or the covering provider during after hours 7P-7A, please log into the web site [email or web address] and access using universal [place] password for that web site. If you do not have the password, please call the hospital operator.  08/15/2021, 10:38 AM

## 2021-08-16 ENCOUNTER — Encounter (HOSPITAL_COMMUNITY): Payer: Self-pay | Admitting: Family Medicine

## 2021-08-16 ENCOUNTER — Inpatient Hospital Stay (HOSPITAL_COMMUNITY): Payer: Medicare Other | Admitting: Certified Registered Nurse Anesthetist

## 2021-08-16 ENCOUNTER — Encounter (HOSPITAL_COMMUNITY)
Admission: EM | Disposition: A | Discharge status: Home Health | Payer: Self-pay | Source: Home / Self Care | Attending: Internal Medicine

## 2021-08-16 DIAGNOSIS — E1152 Type 2 diabetes mellitus with diabetic peripheral angiopathy with gangrene: Secondary | ICD-10-CM | POA: Diagnosis not present

## 2021-08-16 DIAGNOSIS — I96 Gangrene, not elsewhere classified: Secondary | ICD-10-CM | POA: Diagnosis not present

## 2021-08-16 HISTORY — PX: APPLICATION OF WOUND VAC: SHX5189

## 2021-08-16 HISTORY — PX: TRANSMETATARSAL AMPUTATION: SHX6197

## 2021-08-16 LAB — CBC
HCT: 31.3 % — ABNORMAL LOW (ref 36.0–46.0)
Hemoglobin: 10.1 g/dL — ABNORMAL LOW (ref 12.0–15.0)
MCH: 29.3 pg (ref 26.0–34.0)
MCHC: 32.3 g/dL (ref 30.0–36.0)
MCV: 90.7 fL (ref 80.0–100.0)
Platelets: 216 10*3/uL (ref 150–400)
RBC: 3.45 MIL/uL — ABNORMAL LOW (ref 3.87–5.11)
RDW: 17.1 % — ABNORMAL HIGH (ref 11.5–15.5)
WBC: 11.2 10*3/uL — ABNORMAL HIGH (ref 4.0–10.5)
nRBC: 0.4 % — ABNORMAL HIGH (ref 0.0–0.2)

## 2021-08-16 LAB — RENAL FUNCTION PANEL
Albumin: 2 g/dL — ABNORMAL LOW (ref 3.5–5.0)
Anion gap: 14 (ref 5–15)
BUN: 31 mg/dL — ABNORMAL HIGH (ref 6–20)
CO2: 24 mmol/L (ref 22–32)
Calcium: 9.3 mg/dL (ref 8.9–10.3)
Chloride: 96 mmol/L — ABNORMAL LOW (ref 98–111)
Creatinine, Ser: 8.23 mg/dL — ABNORMAL HIGH (ref 0.44–1.00)
GFR, Estimated: 6 mL/min — ABNORMAL LOW (ref >60)
Glucose, Bld: 287 mg/dL — ABNORMAL HIGH (ref 70–99)
Phosphorus: 6.5 mg/dL — ABNORMAL HIGH (ref 2.5–4.6)
Potassium: 3.9 mmol/L (ref 3.5–5.1)
Sodium: 134 mmol/L — ABNORMAL LOW (ref 135–145)

## 2021-08-16 LAB — GLUCOSE, CAPILLARY
Glucose-Capillary: 270 mg/dL — ABNORMAL HIGH (ref 70–99)
Glucose-Capillary: 100 mg/dL — ABNORMAL HIGH (ref 70–99)
Glucose-Capillary: 91 mg/dL (ref 70–99)
Glucose-Capillary: 102 mg/dL — ABNORMAL HIGH (ref 70–99)
Glucose-Capillary: 76 mg/dL (ref 70–99)
Glucose-Capillary: 76 mg/dL (ref 70–99)
Glucose-Capillary: 54 mg/dL — ABNORMAL LOW (ref 70–99)

## 2021-08-16 LAB — SURGICAL PCR SCREEN
MRSA, PCR: NEGATIVE
Staphylococcus aureus: NEGATIVE

## 2021-08-16 SURGERY — AMPUTATION, FOOT, TRANSMETATARSAL
Anesthesia: General | Site: Toe | Laterality: Right

## 2021-08-16 MED ORDER — ONDANSETRON HCL 4 MG/2ML IJ SOLN
INTRAMUSCULAR | Status: AC
  Filled 2021-08-16: qty 2

## 2021-08-16 MED ORDER — FENTANYL CITRATE (PF) 250 MCG/5ML IJ SOLN
INTRAMUSCULAR | Status: DC | PRN
  Administered 2021-08-16 (×2): 50 ug via INTRAVENOUS

## 2021-08-16 MED ORDER — MIDAZOLAM HCL 2 MG/2ML IJ SOLN
INTRAMUSCULAR | Status: AC
  Filled 2021-08-16: qty 2

## 2021-08-16 MED ORDER — CHLORHEXIDINE GLUCONATE CLOTH 2 % EX PADS: 6.0000 | MEDICATED_PAD | Freq: Every day | CUTANEOUS | Status: DC

## 2021-08-16 MED ORDER — ACETAMINOPHEN 500 MG PO TABS
1000.0000 mg | ORAL_TABLET | Freq: Once | ORAL | Status: AC
  Administered 2021-08-16: 1000 mg via ORAL
  Filled 2021-08-16: qty 2

## 2021-08-16 MED ORDER — CHLORHEXIDINE GLUCONATE 0.12 % MT SOLN: 15.0000 mL | Freq: Once | OROMUCOSAL | Status: DC

## 2021-08-16 MED ORDER — ORAL CARE MOUTH RINSE: 15.0000 mL | Freq: Once | OROMUCOSAL | Status: DC

## 2021-08-16 MED ORDER — PHENYLEPHRINE 40 MCG/ML (10ML) SYRINGE FOR IV PUSH (FOR BLOOD PRESSURE SUPPORT)
PREFILLED_SYRINGE | INTRAVENOUS | Status: AC
  Filled 2021-08-16: qty 10

## 2021-08-16 MED ORDER — HYDROMORPHONE HCL 1 MG/ML IJ SOLN
INTRAMUSCULAR | Status: AC
  Filled 2021-08-16: qty 1

## 2021-08-16 MED ORDER — 0.9 % SODIUM CHLORIDE (POUR BTL) OPTIME
TOPICAL | Status: DC | PRN
  Administered 2021-08-16: 1000 mL

## 2021-08-16 MED ORDER — ONDANSETRON HCL 4 MG/2ML IJ SOLN
INTRAMUSCULAR | Status: DC | PRN
  Administered 2021-08-16: 4 mg via INTRAVENOUS

## 2021-08-16 MED ORDER — CHLORHEXIDINE GLUCONATE 0.12 % MT SOLN
OROMUCOSAL | Status: AC
  Administered 2021-08-16: 15 mL
  Filled 2021-08-16: qty 15

## 2021-08-16 MED ORDER — ARTIFICIAL TEARS OPHTHALMIC OINT
TOPICAL_OINTMENT | OPHTHALMIC | Status: AC
  Filled 2021-08-16: qty 3.5

## 2021-08-16 MED ORDER — LIDOCAINE 2% (20 MG/ML) 5 ML SYRINGE
INTRAMUSCULAR | Status: DC | PRN
  Administered 2021-08-16: 60 mg via INTRAVENOUS

## 2021-08-16 MED ORDER — HYDROMORPHONE HCL 1 MG/ML IJ SOLN
0.2500 mg | INTRAMUSCULAR | Status: DC | PRN
  Administered 2021-08-16 (×2): 0.5 mg via INTRAVENOUS

## 2021-08-16 MED ORDER — LIDOCAINE 2% (20 MG/ML) 5 ML SYRINGE
INTRAMUSCULAR | Status: AC
  Filled 2021-08-16: qty 5

## 2021-08-16 MED ORDER — SODIUM CHLORIDE 0.9 % IV SOLN: INTRAVENOUS | Status: DC

## 2021-08-16 MED ORDER — FENTANYL CITRATE (PF) 250 MCG/5ML IJ SOLN
INTRAMUSCULAR | Status: AC
  Filled 2021-08-16: qty 5

## 2021-08-16 MED ORDER — PROPOFOL 10 MG/ML IV BOLUS
INTRAVENOUS | Status: DC | PRN
  Administered 2021-08-16: 100 mg via INTRAVENOUS
  Administered 2021-08-16 (×2): 30 mg via INTRAVENOUS

## 2021-08-16 SURGICAL SUPPLY — 42 items
BAG COUNTER SPONGE SURGICOUNT (BAG) ×3 IMPLANT
BAG SPNG CNTER NS LX DISP (BAG) ×2
BLADE CORE FAN STRYKER (BLADE) ×1 IMPLANT
BNDG CONFORM 3 STRL LF (GAUZE/BANDAGES/DRESSINGS) IMPLANT
BNDG ELASTIC 4X5.8 VLCR STR LF (GAUZE/BANDAGES/DRESSINGS) ×3 IMPLANT
BNDG GAUZE ELAST 4 BULKY (GAUZE/BANDAGES/DRESSINGS) ×2 IMPLANT
CANISTER SUCT 3000ML PPV (MISCELLANEOUS) ×3 IMPLANT
CANISTER WOUNDNEG PRESSURE 500 (CANNISTER) ×1 IMPLANT
COVER SURGICAL LIGHT HANDLE (MISCELLANEOUS) ×3 IMPLANT
DRAPE EXTREMITY T 121X128X90 (DISPOSABLE) ×3 IMPLANT
DRAPE HALF SHEET 40X57 (DRAPES) ×3 IMPLANT
DRAPE INCISE 23X17 IOBAN STRL (DRAPES) ×1
DRAPE INCISE 23X17 STRL (DRAPES) IMPLANT
DRAPE INCISE IOBAN 23X17 STRL (DRAPES) ×2 IMPLANT
DRSG VAC ATS SM SENSATRAC (GAUZE/BANDAGES/DRESSINGS) ×1 IMPLANT
ELECT REM PT RETURN 9FT ADLT (ELECTROSURGICAL) ×3
ELECTRODE REM PT RTRN 9FT ADLT (ELECTROSURGICAL) ×2 IMPLANT
GAUZE SPONGE 4X4 12PLY STRL (GAUZE/BANDAGES/DRESSINGS) ×2 IMPLANT
GLOVE SRG 8 PF TXTR STRL LF DI (GLOVE) ×2 IMPLANT
GLOVE SURG POLYISO LF SZ7.5 (GLOVE) ×3 IMPLANT
GLOVE SURG UNDER POLY LF SZ8 (GLOVE) ×3
GOWN STRL REUS W/ TWL LRG LVL3 (GOWN DISPOSABLE) ×4 IMPLANT
GOWN STRL REUS W/ TWL XL LVL3 (GOWN DISPOSABLE) ×2 IMPLANT
GOWN STRL REUS W/TWL LRG LVL3 (GOWN DISPOSABLE) ×6
GOWN STRL REUS W/TWL XL LVL3 (GOWN DISPOSABLE) ×3
KIT BASIN OR (CUSTOM PROCEDURE TRAY) ×3 IMPLANT
KIT TURNOVER KIT B (KITS) ×3 IMPLANT
NDL HYPO 25GX1X1/2 BEV (NEEDLE) IMPLANT
NEEDLE HYPO 25GX1X1/2 BEV (NEEDLE) IMPLANT
NS IRRIG 1000ML POUR BTL (IV SOLUTION) ×3 IMPLANT
PACK GENERAL/GYN (CUSTOM PROCEDURE TRAY) ×3 IMPLANT
PAD ARMBOARD 7.5X6 YLW CONV (MISCELLANEOUS) ×6 IMPLANT
SPECIMEN JAR SMALL (MISCELLANEOUS) ×3 IMPLANT
SPONGE T-LAP 18X18 ~~LOC~~+RFID (SPONGE) ×1 IMPLANT
SUT ETHILON 1 LR 30 (SUTURE) IMPLANT
SUT ETHILON 2 0 PSLX (SUTURE) ×8 IMPLANT
SUT ETHILON 3 0 PS 1 (SUTURE) ×5 IMPLANT
SYR CONTROL 10ML LL (SYRINGE) IMPLANT
TOWEL GREEN STERILE (TOWEL DISPOSABLE) ×6 IMPLANT
TOWEL GREEN STERILE FF (TOWEL DISPOSABLE) ×3 IMPLANT
UNDERPAD 30X36 HEAVY ABSORB (UNDERPADS AND DIAPERS) ×3 IMPLANT
WATER STERILE IRR 1000ML POUR (IV SOLUTION) ×3 IMPLANT

## 2021-08-16 NOTE — Transfer of Care (Signed)
Immediate Anesthesia Transfer of Care Note  Patient: [name]  Procedure(s) Performed: CLOSURE OF RIGHT TRANSMETATARSAL AMPUTATION (Right: Toe) APPLICATION OF WOUND VAC  Patient Location: PACU  Anesthesia Type:General  Level of Consciousness: awake, alert  and oriented  Airway & Oxygen Therapy: Patient Spontanous Breathing and Patient connected to nasal cannula oxygen  Post-op Assessment: Report given to RN and Post -op Vital signs reviewed and stable  Post vital signs: Reviewed and stable  Last Vitals:  Vitals Value Taken Time  BP 111/61 08/16/21 1524  Temp    Pulse 78 08/16/21 1524  Resp 17 08/16/21 1524  SpO2 99 % 08/16/21 1524  Vitals shown include unvalidated device data.  Last Pain:  Vitals:   08/16/21 1118  TempSrc: Oral  PainSc: 0-No pain      Patients Stated Pain Goal: 0 (15/86/82 5749)  Complications: No notable events documented.

## 2021-08-16 NOTE — Progress Notes (Addendum)
Pt has returned to 4E from PACU. VSS. Placed back on tele. R peroneal and L DP pulses dopplerable. CBG 76. Pt currently refusing any intact to increase CBG level.  Raelyn Number, RN

## 2021-08-16 NOTE — Progress Notes (Signed)
Pt refused to get dressing changed to night. Her vital signs remain stable. No acute distress noted. We will monitor.  Kennyth Lose, RN

## 2021-08-16 NOTE — Op Note (Signed)
° ° °  Patient name: [name] MRN: [id number] DOB: Mar 29, 1981 Sex: female  08/16/2021 Pre-operative Diagnosis: Open right foot transmetatarsal amputation Post-operative diagnosis:  Same Surgeon:  [name] Procedure:   #1 revision of right foot transmetatarsal amputation (resected bone further back as well as skin and soft tissue)   #2: Placement of incisional wound VAC Anesthesia: General Blood Loss: Minimal Specimens: None  Findings: I resected approximately another centimeter of metatarsal bone on all 5 digits.  Also removed muscle and subcutaneous tissue on the posterior flap as well as skin edges.  The wound is marginal for healing.  Indications: This is a 41 year old female who underwent open transmetatarsal amputation for deep space infection.  She comes back today for washout and possible closure  Procedure:  The patient was identified in the holding area and taken to [place] [facility]  The patient was then placed supine on the table. general anesthesia was administered.  The patient was prepped and draped in the usual sterile fashion.  A time out was called and antibiotics were administered.  The skin edges were all nonviable and so a 10 blade was used to resect an additional 3 mm of skin throughout the entire incision.  I also felt the subcutaneous tissue muscle on the posterior flap was nonviable and so this was sharply debrided with a 10 blade.  I then elevated the periosteum off of the bone and use a reciprocating saw to remove an additional 1 cm of bone.  I debrided all nonviable tissue.  The wound was copiously irrigated.  I elected to give her 1 chance at healing this.  I placed several interrupted 3-0 nylon vertical mattress sutures and loosely closed the skin.  I then placed a incisional VAC over the wound.   Disposition: To PACU in stable   V. [name], M.D., [facility] [place] Office: [phone] Pager:  [phone]

## 2021-08-16 NOTE — Anesthesia Procedure Notes (Signed)
Procedure Name: LMA Insertion Date/Time: 08/16/2021 2:36 PM Performed by: Alain Marion, CRNA Pre-anesthesia Checklist: Patient identified, Emergency Drugs available, Suction available and Patient being monitored Patient Re-evaluated:Patient Re-evaluated prior to induction Oxygen Delivery Method: Circle System Utilized Preoxygenation: Pre-oxygenation with 100% oxygen Induction Type: IV induction LMA: LMA inserted LMA Size: 4.0 Number of attempts: 1 Airway Equipment and Method: Bite block Placement Confirmation: positive ETCO2 Tube secured with: Tape Dental Injury: Teeth and Oropharynx as per pre-operative assessment  Comments: LMA placed by [place], SRNA.

## 2021-08-16 NOTE — Progress Notes (Signed)
[facility] Progress Note   Subjective: Last HD on 1/3 with 2.5 kg UF.   Has been NPO for partial closure of her transmetatarsal amputation today.  She thinks her right arm is less swollen  Review of systems:  Denies shortness of breath or chest pain Denies n/v and has been NPO  Vitals:   08/15/21 2008 08/15/21 2319 08/16/21 0431 08/16/21 0754  BP: 137/66 (!) 150/64 (!) 150/94 (!) 150/79  Pulse: 81 88 95 62  Resp: 18 15 20 20   Temp: 98.8 F (37.1 C)  99.2 F (37.3 C) 98.4 F (36.9 C)  TempSrc: Oral  Oral Oral  SpO2: 100% 100% 99% 99%  Weight:   63.7 kg   Height:        Exam:   General adult female in bed in no acute distress; lying on right side  HEENT normocephalic atraumatic Neck supple trachea midline Lungs clear to auscultation bilaterally normal work of breathing at rest on room air Heart S1S2 no rub Abdomen soft nontender nondistended Extremities no edema appreciated lower extremities; right foot is wrapped Psych normal mood and affect Neuro - alert and oriented x 3  Access - RUE AVF with bruit and thrill - less swollen    OP HD: [place] TTS   3h 70min  400/1.5  68.2kg  2/2 bath  15g  Hep 4000  AVF  - mircera 23mcg IV q 2 weeks- last dose 12/27  - hectorol 9 mcg IV q HD   - sensipar 90mg  2 tabs po QHS   - home meds > amlodipine 10mg  QD, renvela 2.4g 1 ac tid  Assessment/ Plan: R great toe diabetic infection: s/p angiogram and 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  IV antibiotics per primary team.  Please notify nephrology if antibiotics needed with outpatient HD.  See that at minimum six weeks of abx planned per charting - we can give vanc with HD outpatient but cannot do IV cipro outpatient with HD as is daily.  Unsure if planned to transition to PO cipro.  Defer to team.  Please confirm end date for abx if needed with outpatient HD as well.   ESRD:  HD per TTS schedule.  I've asked her to elevate her right arm   Hypertension/volume: optimize volume with HD  Anemia CKD  and blood loss: outpatient HD notes reveal pt had noticed red blood in her stools and had been referred to GI outpatient. Note pt got 2 units PRBC's for Hb 6.6 on 12/31 per charting.  Next esa due on 08/22/21. CBC in AM  Metabolic bone disease: hyperphosphatemia - for HD.  On renvela packets - increased dose to 1.6 gram TID. Hypercalcemia when corrected for albumin.  stopped high dose hectorol. Watch calcium.  We restarted sensipar here.  T2DM: hyperglycemic on admit,  had not been taking her insulin at home. Management per primary team.   Disposition - per primary team and vascular   Recent Labs  Lab 08/14/21 0445 08/15/21 0546 08/16/21 0446  K 3.6 4.2 3.9  BUN 59* 68* 31*  CREATININE 11.60* 13.83* 8.23*  CALCIUM 9.3 9.4 9.3  PHOS 8.3* 9.3* 6.5*  HGB 9.7*  --  10.1*   Inpatient medications:  sodium chloride   Intravenous Once   Chlorhexidine Gluconate Cloth  6 each Topical Q0600   Chlorhexidine Gluconate Cloth  6 each Topical Q0600   cinacalcet  90 mg Oral Daily   heparin  5,000 Units Subcutaneous Q8H   insulin aspart  0-6  Units Subcutaneous TID WC & HS   insulin glargine-yfgn  26 Units Subcutaneous QHS   LORazepam  1 mg Intravenous Once   losartan  50 mg Oral Daily   multivitamin  1 tablet Oral QHS   sevelamer carbonate  1.6 g Oral TID WC   sodium chloride flush  3 mL Intravenous Q12H   sodium chloride flush  3 mL Intravenous Q12H    sodium chloride     sodium chloride 500 mL (08/14/21 2116)   ciprofloxacin 400 mg (08/15/21 2135)   vancomycin Stopped (08/15/21 1526)   sodium chloride, sodium chloride, acetaminophen **OR** acetaminophen, diphenhydrAMINE, fentaNYL (SUBLIMAZE) injection, hydrALAZINE, labetalol, naLOXone (NARCAN)  injection, ondansetron (ZOFRAN) IV, oxyCODONE, senna-docusate, sodium chloride flush, sodium chloride flush   Claudia Desanctis, MD 08/16/2021 8:37 AM

## 2021-08-16 NOTE — Progress Notes (Signed)
PROGRESS NOTE    [name]  WUX:[id number] DOB: 04-07-1981 DOA: 08/10/2021 PCP: [facility]    Chief Complaint  Patient presents with   Gen. Weakness / SOB     Missed Dialysis / Hyperglycemia    Brief Narrative:  41 year old with history of type 2 diabetes on insulin, ESRD on hemodialysis TTS schedule, neuropathy, hypertension with poor compliance came to the emergency room with right foot pain and infection.  She was found to have gangrenous toes.  Underwent angiogram and urgent transmetatarsal amputation  Subjective:  For partial closure of her transmetatarsal amputation today She is seen after returned from surgery, wound vac attached to right foot, she does not want to talk much, she denies acute needs, wants me to come back tomorrow  Assessment & Plan:   Principal Problem:   Gangrene of right foot ([place]) Active Problems:   Anemia of chronic disease   ESRD on hemodialysis ([place])   Uncontrolled type 2 diabetes mellitus with hyperglycemia ([place])   Hyponatremia  Severe right foot infection/gas gangrene right foot, present on admission Status post open right foot amputation on 12/30 Revision of right foot transmetatarsal amputation /placement of wound VAC on 1/4 Vascular recommended vanc and cipro for total of 6 weeks, vanc can be dosed with HD, vascular oked with oral cipro -Appreciate vascular surgery input, will follow recommendation  ESRD on HD, TTS, anemia of chronic disease Status post 2 PRBC transfusion on 12/31, hgb has been stable post transfusion Last HD on 1/3 Management per nephrology  Uncontrolled insulin-dependent diabetes Hyperglycemia A1c 10.4 Continue adjust insulin  Hypertension Blood pressure stable on home medication Cozaar  Body mass index is 26.53 kg/m.Marland Kitchen      Unresulted Labs (From admission, onward)     Start     Ordered   08/11/21 1307  Surgical pcr screen  ONCE - STAT,   STAT        08/11/21 1307    08/10/21 2151  Urinalysis, Routine w reflex microscopic  (Undifferentiated presentation (screening labs and basic nursing orders))  ONCE - STAT,   STAT        08/10/21 2152   08/10/21 2151  Urine Culture  (Undifferentiated presentation (screening labs and basic nursing orders))  ONCE - STAT,   STAT       Question:  Indication  Answer:  Sepsis   08/10/21 2152              DVT prophylaxis: heparin injection 5,000 Units Start: 08/11/21 0600   Code Status: Full Family Communication: Patient Disposition:   Status is: Inpatient  Dispo: The patient is from: Home              Anticipated d/c is to: Possible home health              Anticipated d/c date is: TBD               Consultants:  Vascular surgery Nephrology  Procedures:  Dialysis  Antimicrobials:    Anti-infectives (From admission, onward)    Start     Dose/Rate Route Frequency Ordered Stop   08/12/21 1200  vancomycin (VANCOREADY) IVPB 750 mg/150 mL        750 mg 150 mL/hr over 60 Minutes Intravenous Every T-Th-Sa (Hemodialysis) 08/11/21 0238     08/11/21 2200  ciprofloxacin (CIPRO) IVPB 400 mg       Note to Pharmacy: Cipro 400 mg IV q24h for Cr Cl <  30 mL/min   400 mg 200 mL/hr over 60 Minutes Intravenous Every 24 hours 08/11/21 0321     08/11/21 1000  ciprofloxacin (CIPRO) IVPB 400 mg  Status:  Discontinued        400 mg 200 mL/hr over 60 Minutes Intravenous Every 12 hours 08/11/21 0316 08/11/21 0321   08/11/21 0600  clindamycin (CLEOCIN) IVPB 600 mg  Status:  Discontinued        600 mg 100 mL/hr over 30 Minutes Intravenous Every 8 hours 08/11/21 0316 08/14/21 0912   08/11/21 0100  vancomycin (VANCOREADY) IVPB 1500 mg/300 mL        1,500 mg 150 mL/hr over 120 Minutes Intravenous  Once 08/11/21 0014 08/11/21 0404   08/11/21 0015  ciprofloxacin (CIPRO) IVPB 400 mg        400 mg 200 mL/hr over 60 Minutes Intravenous  Once 08/11/21 0014 08/11/21 0202   08/11/21 0015  clindamycin (CLEOCIN) IVPB 600 mg         600 mg 100 mL/hr over 30 Minutes Intravenous  Once 08/11/21 0014 08/11/21 0057          Objective: Vitals:   08/16/21 0431 08/16/21 0754 08/16/21 1118 08/16/21 1121  BP: (!) 150/94 (!) 150/79  (!) 168/67  Pulse: 95 62 88   Resp: 20 20 19    Temp: 99.2 F (37.3 C) 98.4 F (36.9 C) 98.3 F (36.8 C)   TempSrc: Oral Oral Oral   SpO2: 99% 99% 99%   Weight: 63.7 kg     Height:        Intake/Output Summary (Last 24 hours) at 08/16/2021 1317 Last data filed at 08/16/2021 0400 Gross per 24 hour  Intake 200 ml  Output 2500 ml  Net -2300 ml   Filed Weights   08/15/21 1145 08/15/21 1522 08/16/21 0431  Weight: 67.6 kg 65 kg 63.7 kg    Examination:  General exam: she is aaox3 but choose not to engage much, she denies acute needs, she asks me to come back tomorrow Respiratory system: . Respiratory effort normal. Cardiovascular system:  RRR.  Gastrointestinal system: Abdomen is nondistended, soft and nontender.  Normal bowel sounds heard. Central nervous system: Alert and oriented. No focal neurological deficits. Extremities:  + Amputation right foot, + wound vac Skin: No rashes, lesions or ulcers Psychiatry: Does not want to engage much, no agitation.     Data Reviewed: I have personally reviewed following labs and imaging studies  CBC: Recent Labs  Lab 08/10/21 2212 08/11/21 0415 08/12/21 1123 08/13/21 0856 08/14/21 0445 08/16/21 0446  WBC 15.5* 15.0* 10.6* 12.9* 14.6* 11.2*  NEUTROABS 13.4*  --   --   --  11.3*  --   HGB 7.3* 7.2* 6.6* 10.1* 9.7* 10.1*  HCT 24.2* 23.4* 20.9* 31.2* 29.7* 31.3*  MCV 93.4 94.7 91.7 90.2 90.0 90.7  PLT 348 291 259 229 244 191    Basic Metabolic Panel: Recent Labs  Lab 08/11/21 0415 08/12/21 1123 08/14/21 0445 08/15/21 0546 08/16/21 0446  NA 129* 132* 131* 135 134*  K 4.3 4.1 3.6 4.2 3.9  CL 89* 90* 90* 95* 96*  CO2 22 24 24  21* 24  GLUCOSE 522* 218* 316* 216* 287*  BUN 71* 79* 59* 68* 31*  CREATININE 12.70* 14.26* 11.60*  13.83* 8.23*  CALCIUM 9.0 9.6 9.3 9.4 9.3  PHOS  --  9.1* 8.3* 9.3* 6.5*    GFR: Estimated Creatinine Clearance: 7.9 mL/min (A) (by C-G formula based on SCr of 8.23 mg/dL (  H)).  Liver Function Tests: Recent Labs  Lab 08/10/21 2212 08/12/21 1123 08/14/21 0445 08/15/21 0546 08/16/21 0446  AST 149*  --   --   --   --   ALT 321*  --   --   --   --   ALKPHOS 101  --   --   --   --   BILITOT 0.8  --   --   --   --   PROT 8.3*  --   --   --   --   ALBUMIN 2.4* 2.0* 2.0* 2.0* 2.0*    CBG: Recent Labs  Lab 08/15/21 2157 08/16/21 0607 08/16/21 1101 08/16/21 1205 08/16/21 1309  GLUCAP 209* 270* 100* 91 102*     Recent Results (from the past 240 hour(s))  Blood Culture (routine x 2)     Status: Abnormal   Collection Time: 08/10/21  9:56 PM   Specimen: BLOOD  Result Value Ref Range Status   Specimen Description BLOOD  Final   Special Requests   Final    BOTTLES DRAWN AEROBIC AND ANAEROBIC Blood Culture adequate volume   Culture  Setup Time   Final    GRAM POSITIVE COCCI AEROBIC BOTTLE ONLY CRITICAL RESULT CALLED TO, READ BACK BY AND VERIFIED WITH: PHARMD GREG ABBOTT 08/12/21@00 :32 BY TW    Culture (A)  Final    STAPHYLOCOCCUS EPIDERMIDIS THE SIGNIFICANCE OF ISOLATING THIS ORGANISM FROM A SINGLE SET OF BLOOD CULTURES WHEN MULTIPLE SETS ARE DRAWN IS UNCERTAIN. PLEASE NOTIFY THE MICROBIOLOGY DEPARTMENT WITHIN ONE WEEK IF SPECIATION AND SENSITIVITIES ARE REQUIRED. Performed at [facility], [place] [address] [place], [place] [address]    Report Status 08/14/2021 FINAL  Final  Blood Culture ID Panel (Reflexed)     Status: Abnormal   Collection Time: 08/10/21  9:56 PM  Result Value Ref Range Status   Enterococcus faecalis NOT DETECTED NOT DETECTED Final   Enterococcus Faecium NOT DETECTED NOT DETECTED Final   Listeria monocytogenes NOT DETECTED NOT DETECTED Final   Staphylococcus species DETECTED (A) NOT DETECTED Final    Comment: CRITICAL RESULT CALLED TO, READ BACK  BY AND VERIFIED WITH: PHARMD GREG ABBOTT 08/12/21@00 :32 BY TW    Staphylococcus aureus (BCID) NOT DETECTED NOT DETECTED Final   Staphylococcus epidermidis DETECTED (A) NOT DETECTED Final    Comment: Methicillin (oxacillin) resistant coagulase negative staphylococcus. Possible blood culture contaminant (unless isolated from more than one blood culture draw or clinical case suggests pathogenicity). No antibiotic treatment is indicated for blood  culture contaminants. CRITICAL RESULT CALLED TO, READ BACK BY AND VERIFIED WITH: PHARMD GREG ABBOTT 08/12/21@00 :32 BY TW    Staphylococcus lugdunensis NOT DETECTED NOT DETECTED Final   Streptococcus species NOT DETECTED NOT DETECTED Final   Streptococcus agalactiae NOT DETECTED NOT DETECTED Final   Streptococcus pneumoniae NOT DETECTED NOT DETECTED Final   Streptococcus pyogenes NOT DETECTED NOT DETECTED Final   A.calcoaceticus-baumannii NOT DETECTED NOT DETECTED Final   Bacteroides fragilis NOT DETECTED NOT DETECTED Final   Enterobacterales NOT DETECTED NOT DETECTED Final   Enterobacter cloacae complex NOT DETECTED NOT DETECTED Final   Escherichia coli NOT DETECTED NOT DETECTED Final   Klebsiella aerogenes NOT DETECTED NOT DETECTED Final   Klebsiella oxytoca NOT DETECTED NOT DETECTED Final   Klebsiella pneumoniae NOT DETECTED NOT DETECTED Final   Proteus species NOT DETECTED NOT DETECTED Final   Salmonella species NOT DETECTED NOT DETECTED Final   Serratia marcescens NOT DETECTED NOT DETECTED Final   Haemophilus influenzae  NOT DETECTED NOT DETECTED Final   Neisseria meningitidis NOT DETECTED NOT DETECTED Final   Pseudomonas aeruginosa NOT DETECTED NOT DETECTED Final   Stenotrophomonas maltophilia NOT DETECTED NOT DETECTED Final   Candida albicans NOT DETECTED NOT DETECTED Final   Candida auris NOT DETECTED NOT DETECTED Final   Candida glabrata NOT DETECTED NOT DETECTED Final   Candida krusei NOT DETECTED NOT DETECTED Final   Candida  parapsilosis NOT DETECTED NOT DETECTED Final   Candida tropicalis NOT DETECTED NOT DETECTED Final   Cryptococcus neoformans/gattii NOT DETECTED NOT DETECTED Final   Methicillin resistance mecA/C DETECTED (A) NOT DETECTED Final    Comment: CRITICAL RESULT CALLED TO, READ BACK BY AND VERIFIED WITH: PHARMD GREG ABBOTT 08/12/21@00 :32 BY TW Performed at [facility], [address], [place], [place] [address]   Blood Culture (routine x 2)     Status: None   Collection Time: 08/10/21 10:12 PM   Specimen: BLOOD LEFT HAND  Result Value Ref Range Status   Specimen Description BLOOD LEFT HAND  Final   Special Requests   Final    BOTTLES DRAWN AEROBIC AND ANAEROBIC Blood Culture adequate volume   Culture   Final    NO GROWTH 5 DAYS Performed at [facility], [place] [address] [place], [place] [address]    Report Status 08/15/2021 FINAL  Final  Resp Panel by RT-PCR (Flu A&B, Covid) Nasopharyngeal Swab     Status: None   Collection Time: 08/10/21 11:19 PM   Specimen: Nasopharyngeal Swab; Nasopharyngeal(NP) swabs in vial transport medium  Result Value Ref Range Status   SARS Coronavirus 2 by RT PCR NEGATIVE NEGATIVE Final    Comment: (NOTE) SARS-CoV-2 target nucleic acids are NOT DETECTED.  The SARS-CoV-2 RNA is generally detectable in upper respiratory specimens during the acute phase of infection. The lowest concentration of SARS-CoV-2 viral copies this assay can detect is 138 copies/mL. A negative result does not preclude SARS-Cov-2 infection and should not be used as the sole basis for treatment or other patient management decisions. A negative result may occur with  improper specimen collection/handling, submission of specimen other than nasopharyngeal swab, presence of viral mutation(s) within the areas targeted by this assay, and inadequate number of viral copies(<138 copies/mL). A negative result must be combined with clinical observations, patient history, and  epidemiological information. The expected result is Negative.  Fact Sheet for Patients:  EntrepreneurPulse.com.au  Fact Sheet for Healthcare Providers:  IncredibleEmployment.be  This test is no t yet approved or cleared by the [place] FDA and  has been authorized for detection and/or diagnosis of SARS-CoV-2 by FDA under an Emergency Use Authorization (EUA). This EUA will remain  in effect (meaning this test can be used) for the duration of the COVID-19 declaration under Section 564(b)(1) of the Act, 21 U.S.C.section 360bbb-3(b)(1), unless the authorization is terminated  or revoked sooner.       Influenza A by PCR NEGATIVE NEGATIVE Final   Influenza B by PCR NEGATIVE NEGATIVE Final    Comment: (NOTE) The Xpert Xpress SARS-CoV-2/FLU/RSV plus assay is intended as an aid in the diagnosis of influenza from Nasopharyngeal swab specimens and should not be used as a sole basis for treatment. Nasal washings and aspirates are unacceptable for Xpert Xpress SARS-CoV-2/FLU/RSV testing.  Fact Sheet for Patients: EntrepreneurPulse.com.au  Fact Sheet for Healthcare Providers: IncredibleEmployment.be  This test is not yet approved or cleared by the [place] FDA and has been authorized for detection and/or diagnosis of SARS-CoV-2  by FDA under an Emergency Use Authorization (EUA). This EUA will remain in effect (meaning this test can be used) for the duration of the COVID-19 declaration under Section 564(b)(1) of the Act, 21 U.S.C. section 360bbb-3(b)(1), unless the authorization is terminated or revoked.  Performed at [facility], [place] [address], [place], [place] [address]   Aerobic Culture w Gram Stain (superficial specimen)     Status: None   Collection Time: 08/11/21  5:10 PM   Specimen: Wound  Result Value Ref Range Status   Specimen Description WOUND RIGHT TOE  Final   Special Requests RT BIG  TOE  Final   Gram Stain   Final    FEW FEW GRAM POSITIVE COCCI FEW GRAM POSITIVE RODS RARE GRAM NEGATIVE RODS RARE GRAM NEGATIVE COCCOBACILLI Performed at [facility], [place] [address] [place], [place] [address]    Culture FEW STAPHYLOCOCCUS AUREUS  Final   Report Status 08/14/2021 FINAL  Final   Organism ID, Bacteria STAPHYLOCOCCUS AUREUS  Final      Susceptibility   Staphylococcus aureus - MIC*    CIPROFLOXACIN <=0.5 SENSITIVE Sensitive     ERYTHROMYCIN >=8 RESISTANT Resistant     GENTAMICIN <=0.5 SENSITIVE Sensitive     OXACILLIN 0.5 SENSITIVE Sensitive     TETRACYCLINE <=1 SENSITIVE Sensitive     VANCOMYCIN <=0.5 SENSITIVE Sensitive     TRIMETH/SULFA <=10 SENSITIVE Sensitive     CLINDAMYCIN RESISTANT Resistant     RIFAMPIN <=0.5 SENSITIVE Sensitive     Inducible Clindamycin POSITIVE Resistant     * FEW STAPHYLOCOCCUS AUREUS  Surgical pcr screen     Status: None   Collection Time: 08/16/21  4:38 AM   Specimen: Nasal Mucosa; Nasal Swab  Result Value Ref Range Status   MRSA, PCR NEGATIVE NEGATIVE Final   Staphylococcus aureus NEGATIVE NEGATIVE Final    Comment: (NOTE) The Xpert SA Assay (FDA approved for NASAL specimens in patients 37 years of age and older), is one component of a comprehensive surveillance program. It is not intended to diagnose infection nor to guide or monitor treatment. Performed at [facility], [place] [address], [place], [place] [address]          Radiology Studies: No results found.      Scheduled Meds:  [MAR Hold] sodium chloride   Intravenous Once   chlorhexidine  15 mL Mouth/Throat Once   Or   mouth rinse  15 mL Mouth Rinse Once   [MAR Hold] Chlorhexidine Gluconate Cloth  6 each Topical Q0600   [MAR Hold] Chlorhexidine Gluconate Cloth  6 each Topical Q0600   [MAR Hold] cinacalcet  90 mg Oral Daily   [MAR Hold] heparin  5,000 Units Subcutaneous Q8H   [MAR Hold] insulin aspart  0-6 Units Subcutaneous TID WC & HS   [MAR  Hold] insulin glargine-yfgn  26 Units Subcutaneous QHS   [MAR Hold] LORazepam  1 mg Intravenous Once   [MAR Hold] losartan  50 mg Oral Daily   [MAR Hold] multivitamin  1 tablet Oral QHS   [MAR Hold] sevelamer carbonate  1.6 g Oral TID WC   [MAR Hold] sodium chloride flush  3 mL Intravenous Q12H   [MAR Hold] sodium chloride flush  3 mL Intravenous Q12H   Continuous Infusions:  [MAR Hold] sodium chloride     [MAR Hold] sodium chloride 500 mL (08/14/21 2116)   sodium chloride 10 mL/hr at 08/16/21 1125   [MAR Hold] ciprofloxacin 400 mg (08/15/21  2135)   [MAR Hold] vancomycin Stopped (08/15/21 1526)     LOS: 5 days   Time spent: 37mins Greater than 50% of this time was spent in counseling, explanation of diagnosis, planning of further management, and coordination of care.   Voice Recognition Viviann Spare dictation system was used to create this note, attempts have been made to correct errors. Please contact the author with questions and/or clarifications.   Florencia Reasons, MD PhD FACP [facility]  Available via Epic secure chat 7am-7pm for nonurgent issues Please page for urgent issues To page the attending provider between 7A-7P or the covering provider during after hours 7P-7A, please log into the web site [email or web address] and access using universal [place] password for that web site. If you do not have the password, please call the hospital operator.    08/16/2021, 1:17 PM

## 2021-08-16 NOTE — Anesthesia Preprocedure Evaluation (Addendum)
Anesthesia Evaluation  Patient identified by MRN, date of birth, ID band Patient awake    Reviewed: Allergy & Precautions, H&P , NPO status , Patient's Chart, lab work & pertinent test results  Airway Mallampati: II  TM Distance: >3 FB Neck ROM: Full    Dental no notable dental hx. (+) Poor Dentition, Dental Advisory Given   Pulmonary neg pulmonary ROS,    Pulmonary exam normal breath sounds clear to auscultation       Cardiovascular hypertension, Pt. on medications + Peripheral Vascular Disease   Rhythm:Regular Rate:Normal     Neuro/Psych Anxiety negative neurological ROS     GI/Hepatic negative GI ROS, Neg liver ROS,   Endo/Other  diabetes, Insulin Dependent  Renal/GU ESRF and DialysisRenal disease  negative genitourinary   Musculoskeletal   Abdominal   Peds  Hematology  (+) Blood dyscrasia, anemia ,   Anesthesia Other Findings   Reproductive/Obstetrics negative OB ROS                            Anesthesia Physical Anesthesia Plan  ASA: 3  Anesthesia Plan: General   Post-op Pain Management: Tylenol PO (pre-op)   Induction: Intravenous  PONV Risk Score and Plan: 4 or greater and Ondansetron, Dexamethasone and Midazolam  Airway Management Planned: LMA  Additional Equipment:   Intra-op Plan:   Post-operative Plan: Extubation in OR  Informed Consent: I have reviewed the patients History and Physical, chart, labs and discussed the procedure including the risks, benefits and alternatives for the proposed anesthesia with the patient or authorized representative who has indicated his/her understanding and acceptance.     Dental advisory given  Plan Discussed with: CRNA  Anesthesia Plan Comments:         Anesthesia Quick Evaluation

## 2021-08-16 NOTE — Interval H&P Note (Signed)
History and Physical Interval Note:  08/16/2021 1:45 PM  [name]  has presented today for surgery, with the diagnosis of Open right transmetatarsal amputation.  The various methods of treatment have been discussed with the patient and family. After consideration of risks, benefits and other options for treatment, the patient has consented to  Procedure(s): CLOSURE OF RIGHT TRANSMETATARSAL AMPUTATION (Right) as a surgical intervention.  The patient's history has been reviewed, patient examined, no change in status, stable for surgery.  I have reviewed the patient's chart and labs.  Questions were answered to the patient's satisfaction.     [name]

## 2021-08-16 NOTE — Anesthesia Postprocedure Evaluation (Signed)
Anesthesia Post Note  Patient: [name]  Procedure(s) Performed: CLOSURE OF RIGHT TRANSMETATARSAL AMPUTATION (Right: Toe) APPLICATION OF WOUND VAC     Patient location during evaluation: PACU Anesthesia Type: General Level of consciousness: awake and alert Pain management: pain level controlled Vital Signs Assessment: post-procedure vital signs reviewed and stable Respiratory status: spontaneous breathing, nonlabored ventilation and respiratory function stable Cardiovascular status: blood pressure returned to baseline and stable Postop Assessment: no apparent nausea or vomiting Anesthetic complications: no   No notable events documented.  Last Vitals:  Vitals:   08/16/21 1555 08/16/21 1610  BP: (!) 148/63 (!) 142/62  Pulse: 80 78  Resp: 11 10  Temp:    SpO2: 98% 97%    Last Pain:  Vitals:   08/16/21 1555  TempSrc:   PainSc: Asleep                 Iren Whipp,W. EDMOND

## 2021-08-16 NOTE — Evaluation (Signed)
Occupational Therapy Evaluation Patient Details Name: [name] MRN: [id number] DOB: Jan 10, 1981 Today's Date: 08/16/2021   History of Present Illness 41 year old with history of type 2 diabetes on insulin, ESRD on hemodialysis TTS schedule, neuropathy, hypertension, R eye blindness, with poor compliance came to the emergency room with right foot pain and infection.  She was found to have gangrenous toes.  Underwent angiogram and urgent transmetatarsal amputation.   Clinical Impression   Pt presents with decline in function and safety with ADLs and ADL mobility with impaired strength, balance and endurance. Eval limited by R foot pain and fatigue; required questions being repeated multiple times for pt responses. Pt initially agreeable to sit EOB, however rafter rolling to L side and initiating sup - sit, pt refused to continue. Pt stated that PTA ot lived at home with her husband and children and was Ind with ADLs/selfcare and mobility. Pt currently requires min A for rolling in bed, mod to initiate sup - sit, extensive assist with ADLs at bed level. She is not interested in any post acute level rehab, but might benefit from acute inpatient rehab if she improves her participation. Currently recommending HH therapies if pt's husband/family can provide 24 our care and with DME recommended. Pt would benefit from acute OT services to address impairments to maximize level of function and safety   Recommendations for follow up therapy are one component of a multi-disciplinary discharge planning process, led by the attending physician.  Recommendations may be updated based on patient status, additional functional criteria and insurance authorization.   Follow Up Recommendations  Home health OT    Assistance Recommended at Discharge Frequent or constant Supervision/Assistance  Patient can return home with the following A lot of help with bathing/dressing/bathroom;A lot of help with walking and/or  transfers    Functional Status Assessment  Patient has had a recent decline in their functional status and demonstrates the ability to make significant improvements in function in a reasonable and predictable amount of time.  Equipment Recommendations  BSC/3in1;Wheelchair (measurements OT);Wheelchair cushion (measurements OT);Hospital bed;Other (comment) (RW)    Recommendations for Other Services       Precautions / Restrictions Precautions Precautions: Fall Required Braces or Orthoses: Other Brace Other Brace: Darco shoe R LE Restrictions Weight Bearing Restrictions: Yes RUE Weight Bearing: Weight bearing as tolerated RLE Weight Bearing: Partial weight bearing RLE Partial Weight Bearing Percentage or Pounds: PWB on heel only with Darco shoe      Mobility Bed Mobility Overal bed mobility: Needs Assistance Bed Mobility: Rolling Rolling: Min assist         General bed mobility comments: used rails for rolling, initiated sup - sit but then refused to sit EOB    Transfers                   General transfer comment: refused      Balance                                           ADL either performed or assessed with clinical judgement   ADL Overall ADL's : Needs assistance/impaired Eating/Feeding: Set up;Independent;Sitting;Bed level   Grooming: Wash/dry hands;Wash/dry face;Set up;Bed level   Upper Body Bathing: Set up;Bed level   Lower Body Bathing: Total assistance   Upper Body Dressing : Set up;Bed level   Lower Body Dressing: Total assistance  Toilet Transfer Details (indicate cue type and reason): refsued Toileting- Clothing Manipulation and Hygiene: Total assistance;Bed level         General ADL Comments: pt refusing to sit EOB for AD task or to transfer to [place] Patient Visual Report: No change from baseline       Perception     Praxis      Pertinent Vitals/Pain Pain Assessment: 0-10 Pain Score: 7   Pain Location: R foot Pain Descriptors / Indicators: Sore Pain Intervention(s): Limited activity within patient's tolerance;Monitored during session;Premedicated before session     Hand Dominance Left   Extremity/Trunk Assessment Upper Extremity Assessment Upper Extremity Assessment: Generalized weakness   Lower Extremity Assessment Lower Extremity Assessment: Defer to PT evaluation       Communication Communication Communication: No difficulties   Cognition Arousal/Alertness: Awake/alert Behavior During Therapy: WFL for tasks assessed/performed                                   General Comments: decreased insight, A&O x 4 but sleepy, not agreeable  to mobility despite stating that she doens't want post acute rehab and wants to go home     General Comments       Exercises     Shoulder Instructions      Home Living Family/patient expects to be discharged to:: Private residence Living Arrangements: Spouse/significant other;Children Available Help at Discharge: Family Type of Home: House Home Access: Level entry     Home Layout: Two level;Bed/bath upstairs Alternate Level Stairs-Number of Steps: 1 flight Alternate Level Stairs-Rails: Right Bathroom Shower/Tub: Occupational psychologist: Standard     Home Equipment: None   Additional Comments: reports spouse will stay with her initially      Prior Functioning/Environment Prior Level of Function : Independent/Modified Independent             Mobility Comments: Does not drive, no IADLs.  SCAT to dialysis ADLs Comments: Independent with ADLs/selfcare        OT Problem List: Decreased strength;Decreased activity tolerance;Decreased safety awareness;Impaired balance (sitting and/or standing);Pain      OT Treatment/Interventions: Self-care/ADL training;Patient/family education;Balance training;Therapeutic activities;DME and/or AE instruction    OT Goals(Current goals can be found  in the care plan section) Acute Rehab OT Goals Patient Stated Goal: none stated OT Goal Formulation: With patient Time For Goal Achievement: 08/30/21 Potential to Achieve Goals: Good ADL Goals Pt Will Perform Grooming: with min guard assist;with supervision;with set-up;sitting Pt Will Perform Upper Body Bathing: with min guard assist;with supervision;with set-up;sitting Pt Will Perform Lower Body Bathing: with mod assist;sitting/lateral leans;with caregiver independent in assisting Pt Will Perform Upper Body Dressing: with min guard assist;with supervision;with set-up;sitting Pt Will Perform Lower Body Dressing: with max assist;with mod assist;sitting/lateral leans;with caregiver independent in assisting Pt Will Transfer to Toilet: with mod assist;stand pivot transfer;bedside commode Pt Will Perform Toileting - Clothing Manipulation and hygiene: with max assist;with mod assist;sitting/lateral leans;with caregiver independent in assisting  OT Frequency: Min 2X/week    Co-evaluation              AM-PAC OT "6 Clicks" Daily Activity     Outcome Measure Help from another person eating meals?: None Help from another person taking care of personal grooming?: A Little Help from another person toileting, which includes using toliet, bedpan, or urinal?: Total Help from another person bathing (including washing, rinsing, drying)?:  A Lot Help from another person to put on and taking off regular upper body clothing?: A Little Help from another person to put on and taking off regular lower body clothing?: Total 6 Click Score: 14   End of Session    Activity Tolerance: Patient limited by fatigue;Patient limited by pain Patient left: in bed  OT Visit Diagnosis: Other abnormalities of gait and mobility (R26.89);Pain Pain - Right/Left: Right Pain - part of body: Ankle and joints of foot                Time: 6773-7366 OT Time Calculation (min): 18 min Charges:  OT General Charges $OT Visit:  1 Visit OT Evaluation $OT Eval Moderate Complexity: 1 Mod    Britt Bottom 08/16/2021, 2:55 PM

## 2021-08-17 ENCOUNTER — Encounter (HOSPITAL_COMMUNITY): Payer: Self-pay | Admitting: Surgery

## 2021-08-17 DIAGNOSIS — I96 Gangrene, not elsewhere classified: Secondary | ICD-10-CM | POA: Diagnosis not present

## 2021-08-17 DIAGNOSIS — Z9889 Other specified postprocedural states: Secondary | ICD-10-CM

## 2021-08-17 LAB — RENAL FUNCTION PANEL
Albumin: 1.9 g/dL — ABNORMAL LOW (ref 3.5–5.0)
Anion gap: 12 (ref 5–15)
BUN: 42 mg/dL — ABNORMAL HIGH (ref 6–20)
CO2: 23 mmol/L (ref 22–32)
Calcium: 9.7 mg/dL (ref 8.9–10.3)
Chloride: 99 mmol/L (ref 98–111)
Creatinine, Ser: 10.25 mg/dL — ABNORMAL HIGH (ref 0.44–1.00)
GFR, Estimated: 5 mL/min — ABNORMAL LOW (ref >60)
Glucose, Bld: 146 mg/dL — ABNORMAL HIGH (ref 70–99)
Phosphorus: 8.2 mg/dL — ABNORMAL HIGH (ref 2.5–4.6)
Potassium: 4 mmol/L (ref 3.5–5.1)
Sodium: 134 mmol/L — ABNORMAL LOW (ref 135–145)

## 2021-08-17 LAB — GLUCOSE, CAPILLARY
Glucose-Capillary: 188 mg/dL — ABNORMAL HIGH (ref 70–99)
Glucose-Capillary: 81 mg/dL (ref 70–99)
Glucose-Capillary: 141 mg/dL — ABNORMAL HIGH (ref 70–99)

## 2021-08-17 MED ORDER — CEFAZOLIN SODIUM-DEXTROSE 2-4 GM/100ML-% IV SOLN
2.0000 g | INTRAVENOUS | Status: DC
  Administered 2021-08-17 – 2021-08-24 (×4): 2 g via INTRAVENOUS
  Filled 2021-08-17 (×4): qty 100

## 2021-08-17 MED ORDER — CIPROFLOXACIN HCL 500 MG PO TABS: 500.0000 mg | ORAL_TABLET | Freq: Every day | ORAL | Status: DC

## 2021-08-17 MED ORDER — INSULIN GLARGINE-YFGN 100 UNIT/ML ~~LOC~~ SOLN: 10.0000 [IU] | Freq: Every day | SUBCUTANEOUS | Status: DC

## 2021-08-17 MED ORDER — INSULIN GLARGINE-YFGN 100 UNIT/ML ~~LOC~~ SOLN
20.0000 [IU] | Freq: Every day | SUBCUTANEOUS | Status: DC
  Administered 2021-08-17 – 2021-08-20 (×4): 20 [IU] via SUBCUTANEOUS
  Filled 2021-08-17 (×4): qty 0.2

## 2021-08-17 MED ORDER — DARBEPOETIN ALFA 150 MCG/0.3ML IJ SOSY
150.0000 ug | PREFILLED_SYRINGE | INTRAMUSCULAR | Status: DC
  Administered 2021-08-22: 150 ug via INTRAVENOUS
  Filled 2021-08-17 (×2): qty 0.3

## 2021-08-17 NOTE — Progress Notes (Signed)
[facility] Progress Note   Subjective: Last HD on 1/3 with 2.5 kg UF.   She has had some post-op discomfort.  Had a right TMA revision and wound vac placed yesterday.   Review of systems:  Denies shortness of breath or chest pain Denies n/v   Vitals:   08/16/21 1937 08/16/21 2335 08/17/21 0331 08/17/21 0817  BP: 123/65 (!) 178/84 (!) 166/71 137/62  Pulse: 67 87 87 88  Resp: 11 17 16 18   Temp:  97.6 F (36.4 C)  98.4 F (36.9 C)  TempSrc:  Oral  Oral  SpO2: 96% 97% 98% 94%  Weight:   67.9 kg   Height:        Exam     General adult female in bed in no acute distress HEENT normocephalic atraumatic Neck supple trachea midline Lungs clear to auscultation bilaterally normal work of breathing at rest on room air Heart S1S2 no rub Abdomen soft nontender nondistended Extremities no edema appreciated lower extremities; right foot TMA with wound vac Psych normal mood and affect Neuro - alert and oriented x 3; keeps eyes closed and answers questions Access - RUE AVF with bruit and thrill   OP HD: [place] TTS   3h 39min  400/1.5  68.2kg  2/2 bath  15g  Hep 4000  AVF  - mircera 266mcg IV q 2 weeks- last dose 12/27  - hectorol 9 mcg IV q HD   - sensipar 90mg  2 tabs po QHS   - home meds > amlodipine 10mg  QD, renvela 2.4g 1 ac tid  Assessment/ Plan: R great toe diabetic infection: s/p angiogram and 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  IV antibiotics per primary team.  Please notify nephrology if antibiotics needed with outpatient HD.  See that at minimum six weeks of abx planned per charting - we can give vanc with HD outpatient but cannot do IV cipro outpatient with HD as is daily.  Unsure if planned to transition to PO cipro.  Defer to team.  Please confirm end date for abx if needed with outpatient HD as well.   ESRD:  HD per TTS schedule.    Hypertension/volume: improved. optimize volume with HD  Anemia CKD and blood loss: outpatient HD notes reveal pt had noticed red blood in her stools  and had been referred to GI outpatient. Note pt got 2 units PRBC's for Hb 6.6 on 12/31 per charting.  Plan for Aranesp 150 mcg on 08/22/21 (when ESA next due)   Metabolic bone disease: hyperphosphatemia.  On renvela packets - increased dose to 1.6 gram TID. Hypercalcemia when corrected for albumin.  stopped high dose hectorol. Watch calcium.  We restarted sensipar here.  T2DM: hyperglycemic on admit,  had not been taking her insulin at home. Management per primary team.   Disposition - per primary team and vascular   Recent Labs  Lab 08/14/21 0445 08/15/21 0546 08/16/21 0446  K 3.6 4.2 3.9  BUN 59* 68* 31*  CREATININE 11.60* 13.83* 8.23*  CALCIUM 9.3 9.4 9.3  PHOS 8.3* 9.3* 6.5*  HGB 9.7*  --  10.1*   Inpatient medications:  sodium chloride   Intravenous Once   Chlorhexidine Gluconate Cloth  6 each Topical Q0600   cinacalcet  90 mg Oral Daily   heparin  5,000 Units Subcutaneous Q8H   insulin aspart  0-6 Units Subcutaneous TID WC & HS   insulin glargine-yfgn  26 Units Subcutaneous QHS   LORazepam  1 mg Intravenous Once  losartan  50 mg Oral Daily   multivitamin  1 tablet Oral QHS   sevelamer carbonate  1.6 g Oral TID WC   sodium chloride flush  3 mL Intravenous Q12H   sodium chloride flush  3 mL Intravenous Q12H    sodium chloride     sodium chloride 500 mL (08/14/21 2116)   ciprofloxacin 400 mg (08/16/21 2114)   vancomycin Stopped (08/15/21 1526)   sodium chloride, sodium chloride, acetaminophen **OR** acetaminophen, diphenhydrAMINE, fentaNYL (SUBLIMAZE) injection, hydrALAZINE, labetalol, naLOXone (NARCAN)  injection, ondansetron (ZOFRAN) IV, oxyCODONE, senna-docusate, sodium chloride flush, sodium chloride flush   Claudia Desanctis, MD 08/17/2021 8:29 AM

## 2021-08-17 NOTE — Progress Notes (Signed)
PROGRESS NOTE    [name]  DPO:[id number] DOB: Mar 30, 1981 DOA: 08/10/2021 PCP: [facility]    Chief Complaint  Patient presents with   Gen. Weakness / SOB     Missed Dialysis / Hyperglycemia    Brief Narrative:  41 year old with history of type 2 diabetes on insulin, ESRD on hemodialysis TTS schedule, neuropathy, hypertension with poor compliance came to the emergency room with right foot pain and infection.  She was found to have gangrenous toes.  Underwent angiogram and urgent transmetatarsal amputation  Subjective:  Wound vac attached to left foot, C/o pain left right foot,  She did not want to eat breakfast,   She is more interactive today, she reports lives with her husband and has two children She does not remember her pcp's name, reports she does not have a endocrinologist, she does not have continuous glucose monitoring device   Assessment & Plan:   Principal Problem:   Gangrene of right foot ([place]) Active Problems:   Anemia of chronic disease   ESRD on hemodialysis ([place])   Uncontrolled type 2 diabetes mellitus with hyperglycemia ([place])   Hyponatremia  Severe right foot infection/gas gangrene right foot, present on admission -Status post open right foot amputation on 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 -Revision of right foot transmetatarsal amputation /placement of wound VAC on 1/4 Vascular recommended total of 6 weeks of abx treatment, patient is at risk of  BKA per vascular surgery -she Was on vanc and cipro, -Wound culture +  MSSA, ID pharmacy discussed with vascular, now abx changed to  ancef - will coordinate with nephrology regarding ancef post HD -Appreciate ID pharmacy, nephrology ,vascular surgery input  ESRD on HD, TTS, anemia of chronic disease Status post 2 PRBC transfusion on 12/31, hgb has been stable post transfusion Management per nephrology  Uncontrolled insulin-dependent diabetes, 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 Hardie Lora on last on 09/12/2020, she was prescribed dexcom at that time  Hypertension Blood pressure stable on home medication Cozaar  Body mass index is 28.28 kg/m.Marland Kitchen      Unresulted Labs (From admission, onward)     Start     Ordered   08/18/21 0500  Renal function panel  Tomorrow morning,   R       Question:  Specimen collection method  Answer:  Lab=Lab collect   08/17/21 0834   08/11/21 1307  Surgical pcr screen  ONCE - STAT,   STAT        08/11/21 1307              DVT prophylaxis: heparin injection 5,000 Units Start: 08/11/21 0600   Code Status: Full Family Communication: Patient Disposition:   Status is: Inpatient  Dispo: The patient is from: Home              Anticipated d/c is to: Possible home health vs snf pending Pt eval              Anticipated d/c date is: TBD, need vascular surgery clearance                Consultants:  Vascular surgery Nephrology  Procedures:  Dialysis Status post open right foot amputation on 12/30 -Revision of right foot transmetatarsal amputation /placement of wound  VAC on 1/4  Antimicrobials:    Anti-infectives (From admission, onward)    Start     Dose/Rate Route Frequency Ordered Stop   08/17/21 2200  ciprofloxacin (CIPRO) tablet 500 mg        500 mg Oral Daily at bedtime 08/17/21 0928 09/22/21 2159   08/12/21 1200  vancomycin (VANCOREADY) IVPB 750 mg/150 mL        750 mg 150 mL/hr over 60 Minutes Intravenous Every T-Th-Sa (Hemodialysis) 08/11/21 0238     08/11/21 2200  ciprofloxacin (CIPRO) IVPB 400 mg  Status:  Discontinued       Note to Pharmacy: Cipro 400 mg IV q24h for Cr Cl < 30 mL/min   400 mg 200 mL/hr over 60 Minutes Intravenous Every 24 hours 08/11/21 0321 08/17/21 0928    08/11/21 1000  ciprofloxacin (CIPRO) IVPB 400 mg  Status:  Discontinued        400 mg 200 mL/hr over 60 Minutes Intravenous Every 12 hours 08/11/21 0316 08/11/21 0321   08/11/21 0600  clindamycin (CLEOCIN) IVPB 600 mg  Status:  Discontinued        600 mg 100 mL/hr over 30 Minutes Intravenous Every 8 hours 08/11/21 0316 08/14/21 0912   08/11/21 0100  vancomycin (VANCOREADY) IVPB 1500 mg/300 mL        1,500 mg 150 mL/hr over 120 Minutes Intravenous  Once 08/11/21 0014 08/11/21 0404   08/11/21 0015  ciprofloxacin (CIPRO) IVPB 400 mg        400 mg 200 mL/hr over 60 Minutes Intravenous  Once 08/11/21 0014 08/11/21 0202   08/11/21 0015  clindamycin (CLEOCIN) IVPB 600 mg        600 mg 100 mL/hr over 30 Minutes Intravenous  Once 08/11/21 0014 08/11/21 0057          Objective: Vitals:   08/16/21 2335 08/17/21 0331 08/17/21 0817 08/17/21 1144  BP: (!) 178/84 (!) 166/71 137/62 (!) 178/72  Pulse: 87 87 88 92  Resp: 17 16 18 20   Temp: 97.6 F (36.4 C)  98.4 F (36.9 C) 97.6 F (36.4 C)  TempSrc: Oral  Oral Oral  SpO2: 97% 98% 94% 96%  Weight:  67.9 kg    Height:        Intake/Output Summary (Last 24 hours) at 08/17/2021 1155 Last data filed at 08/16/2021 2030 Gross per 24 hour  Intake 450 ml  Output 50 ml  Net 400 ml   Filed Weights   08/15/21 1522 08/16/21 0431 08/17/21 0331  Weight: 65 kg 63.7 kg 67.9 kg    Examination:  General exam: she is aaox3 , she is more interactive today, right eye blind Respiratory system: .  Clear to auscultation bilaterally ,respiratory effort normal. Cardiovascular system:  RRR.  Gastrointestinal system: Abdomen is nondistended, soft and nontender.  Normal bowel sounds heard. Central nervous system: Alert and oriented. No focal neurological deficits. Extremities:  + Amputation right foot, + wound vac Skin: No rashes, lesions or ulcers Psychiatry: Flat affect, no agitation.     Data Reviewed: I have personally reviewed following labs and  imaging studies  CBC: Recent Labs  Lab 08/10/21 2212 08/11/21 0415 08/12/21 1123 08/13/21 0856 08/14/21 0445 08/16/21 0446  WBC 15.5* 15.0* 10.6* 12.9* 14.6* 11.2*  NEUTROABS 13.4*  --   --   --  11.3*  --   HGB 7.3* 7.2* 6.6* 10.1* 9.7* 10.1*  HCT 24.2* 23.4* 20.9* 31.2* 29.7* 31.3*  MCV 93.4 94.7 91.7 90.2 90.0 90.7  PLT  348 291 259 229 244 767    Basic Metabolic Panel: Recent Labs  Lab 08/12/21 1123 08/14/21 0445 08/15/21 0546 08/16/21 0446 08/17/21 0901  NA 132* 131* 135 134* 134*  K 4.1 3.6 4.2 3.9 4.0  CL 90* 90* 95* 96* 99  CO2 24 24 21* 24 23  GLUCOSE 218* 316* 216* 287* 146*  BUN 79* 59* 68* 31* 42*  CREATININE 14.26* 11.60* 13.83* 8.23* 10.25*  CALCIUM 9.6 9.3 9.4 9.3 9.7  PHOS 9.1* 8.3* 9.3* 6.5* 8.2*    GFR: Estimated Creatinine Clearance: 6.5 mL/min (A) (by C-G formula based on SCr of 10.25 mg/dL (H)).  Liver Function Tests: Recent Labs  Lab 08/10/21 2212 08/12/21 1123 08/14/21 0445 08/15/21 0546 08/16/21 0446 08/17/21 0901  AST 149*  --   --   --   --   --   ALT 321*  --   --   --   --   --   ALKPHOS 101  --   --   --   --   --   BILITOT 0.8  --   --   --   --   --   PROT 8.3*  --   --   --   --   --   ALBUMIN 2.4* 2.0* 2.0* 2.0* 2.0* 1.9*    CBG: Recent Labs  Lab 08/16/21 1528 08/16/21 1725 08/16/21 2118 08/17/21 0642 08/17/21 1146  GLUCAP 76 76 54* 188* 81     Recent Results (from the past 240 hour(s))  Blood Culture (routine x 2)     Status: Abnormal   Collection Time: 08/10/21  9:56 PM   Specimen: BLOOD  Result Value Ref Range Status   Specimen Description BLOOD  Final   Special Requests   Final    BOTTLES DRAWN AEROBIC AND ANAEROBIC Blood Culture adequate volume   Culture  Setup Time   Final    GRAM POSITIVE COCCI AEROBIC BOTTLE ONLY CRITICAL RESULT CALLED TO, READ BACK BY AND VERIFIED WITH: PHARMD GREG ABBOTT 08/12/21@00 :32 BY TW    Culture (A)  Final    STAPHYLOCOCCUS EPIDERMIDIS THE SIGNIFICANCE OF ISOLATING  THIS ORGANISM FROM A SINGLE SET OF BLOOD CULTURES WHEN MULTIPLE SETS ARE DRAWN IS UNCERTAIN. PLEASE NOTIFY THE MICROBIOLOGY DEPARTMENT WITHIN ONE WEEK IF SPECIATION AND SENSITIVITIES ARE REQUIRED. Performed at [facility], [place] [address] [place], [place] [address]    Report Status 08/14/2021 FINAL  Final  Blood Culture ID Panel (Reflexed)     Status: Abnormal   Collection Time: 08/10/21  9:56 PM  Result Value Ref Range Status   Enterococcus faecalis NOT DETECTED NOT DETECTED Final   Enterococcus Faecium NOT DETECTED NOT DETECTED Final   Listeria monocytogenes NOT DETECTED NOT DETECTED Final   Staphylococcus species DETECTED (A) NOT DETECTED Final    Comment: CRITICAL RESULT CALLED TO, READ BACK BY AND VERIFIED WITH: PHARMD GREG ABBOTT 08/12/21@00 :32 BY TW    Staphylococcus aureus (BCID) NOT DETECTED NOT DETECTED Final   Staphylococcus epidermidis DETECTED (A) NOT DETECTED Final    Comment: Methicillin (oxacillin) resistant coagulase negative staphylococcus. Possible blood culture contaminant (unless isolated from more than one blood culture draw or clinical case suggests pathogenicity). No antibiotic treatment is indicated for blood  culture contaminants. CRITICAL RESULT CALLED TO, READ BACK BY AND VERIFIED WITH: PHARMD GREG ABBOTT 08/12/21@00 :32 BY TW    Staphylococcus lugdunensis NOT DETECTED NOT DETECTED Final   Streptococcus species NOT DETECTED NOT DETECTED Final   Streptococcus  agalactiae NOT DETECTED NOT DETECTED Final   Streptococcus pneumoniae NOT DETECTED NOT DETECTED Final   Streptococcus pyogenes NOT DETECTED NOT DETECTED Final   A.calcoaceticus-baumannii NOT DETECTED NOT DETECTED Final   Bacteroides fragilis NOT DETECTED NOT DETECTED Final   Enterobacterales NOT DETECTED NOT DETECTED Final   Enterobacter cloacae complex NOT DETECTED NOT DETECTED Final   Escherichia coli NOT DETECTED NOT DETECTED Final   Klebsiella aerogenes NOT DETECTED NOT DETECTED Final    Klebsiella oxytoca NOT DETECTED NOT DETECTED Final   Klebsiella pneumoniae NOT DETECTED NOT DETECTED Final   Proteus species NOT DETECTED NOT DETECTED Final   Salmonella species NOT DETECTED NOT DETECTED Final   Serratia marcescens NOT DETECTED NOT DETECTED Final   Haemophilus influenzae NOT DETECTED NOT DETECTED Final   Neisseria meningitidis NOT DETECTED NOT DETECTED Final   Pseudomonas aeruginosa NOT DETECTED NOT DETECTED Final   Stenotrophomonas maltophilia NOT DETECTED NOT DETECTED Final   Candida albicans NOT DETECTED NOT DETECTED Final   Candida auris NOT DETECTED NOT DETECTED Final   Candida glabrata NOT DETECTED NOT DETECTED Final   Candida krusei NOT DETECTED NOT DETECTED Final   Candida parapsilosis NOT DETECTED NOT DETECTED Final   Candida tropicalis NOT DETECTED NOT DETECTED Final   Cryptococcus neoformans/gattii NOT DETECTED NOT DETECTED Final   Methicillin resistance mecA/C DETECTED (A) NOT DETECTED Final    Comment: CRITICAL RESULT CALLED TO, READ BACK BY AND VERIFIED WITH: PHARMD GREG ABBOTT 08/12/21@00 :32 BY TW Performed at [facility], [address], [place], [place] [address]   Blood Culture (routine x 2)     Status: None   Collection Time: 08/10/21 10:12 PM   Specimen: BLOOD LEFT HAND  Result Value Ref Range Status   Specimen Description BLOOD LEFT HAND  Final   Special Requests   Final    BOTTLES DRAWN AEROBIC AND ANAEROBIC Blood Culture adequate volume   Culture   Final    NO GROWTH 5 DAYS Performed at [facility], [place] [address] [place], [place] [address]    Report Status 08/15/2021 FINAL  Final  Resp Panel by RT-PCR (Flu A&B, Covid) Nasopharyngeal Swab     Status: None   Collection Time: 08/10/21 11:19 PM   Specimen: Nasopharyngeal Swab; Nasopharyngeal(NP) swabs in vial transport medium  Result Value Ref Range Status   SARS Coronavirus 2 by RT PCR NEGATIVE NEGATIVE Final    Comment: (NOTE) SARS-CoV-2 target nucleic acids are NOT  DETECTED.  The SARS-CoV-2 RNA is generally detectable in upper respiratory specimens during the acute phase of infection. The lowest concentration of SARS-CoV-2 viral copies this assay can detect is 138 copies/mL. A negative result does not preclude SARS-Cov-2 infection and should not be used as the sole basis for treatment or other patient management decisions. A negative result may occur with  improper specimen collection/handling, submission of specimen other than nasopharyngeal swab, presence of viral mutation(s) within the areas targeted by this assay, and inadequate number of viral copies(<138 copies/mL). A negative result must be combined with clinical observations, patient history, and epidemiological information. The expected result is Negative.  Fact Sheet for Patients:  EntrepreneurPulse.com.au  Fact Sheet for Healthcare Providers:  IncredibleEmployment.be  This test is no t yet approved or cleared by the [place] FDA and  has been authorized for detection and/or diagnosis of SARS-CoV-2 by FDA under an Emergency Use Authorization (EUA). This EUA will remain  in effect (meaning this test can be used) for the duration of the  COVID-19 declaration under Section 564(b)(1) of the Act, 21 U.S.C.section 360bbb-3(b)(1), unless the authorization is terminated  or revoked sooner.       Influenza A by PCR NEGATIVE NEGATIVE Final   Influenza B by PCR NEGATIVE NEGATIVE Final    Comment: (NOTE) The Xpert Xpress SARS-CoV-2/FLU/RSV plus assay is intended as an aid in the diagnosis of influenza from Nasopharyngeal swab specimens and should not be used as a sole basis for treatment. Nasal washings and aspirates are unacceptable for Xpert Xpress SARS-CoV-2/FLU/RSV testing.  Fact Sheet for Patients: EntrepreneurPulse.com.au  Fact Sheet for Healthcare Providers: IncredibleEmployment.be  This test is not yet  approved or cleared by the [place] FDA and has been authorized for detection and/or diagnosis of SARS-CoV-2 by FDA under an Emergency Use Authorization (EUA). This EUA will remain in effect (meaning this test can be used) for the duration of the COVID-19 declaration under Section 564(b)(1) of the Act, 21 U.S.C. section 360bbb-3(b)(1), unless the authorization is terminated or revoked.  Performed at [facility], [place] [address] [place], [place] [address]   Aerobic Culture w Gram Stain (superficial specimen)     Status: None   Collection Time: 08/11/21  5:10 PM   Specimen: Wound  Result Value Ref Range Status   Specimen Description WOUND RIGHT TOE  Final   Special Requests RT BIG TOE  Final   Gram Stain   Final    FEW FEW GRAM POSITIVE COCCI FEW GRAM POSITIVE RODS RARE GRAM NEGATIVE RODS RARE GRAM NEGATIVE COCCOBACILLI Performed at [facility], [place] [address], [place], [place] [address]    Culture FEW STAPHYLOCOCCUS AUREUS  Final   Report Status 08/14/2021 FINAL  Final   Organism ID, Bacteria STAPHYLOCOCCUS AUREUS  Final      Susceptibility   Staphylococcus aureus - MIC*    CIPROFLOXACIN <=0.5 SENSITIVE Sensitive     ERYTHROMYCIN >=8 RESISTANT Resistant     GENTAMICIN <=0.5 SENSITIVE Sensitive     OXACILLIN 0.5 SENSITIVE Sensitive     TETRACYCLINE <=1 SENSITIVE Sensitive     VANCOMYCIN <=0.5 SENSITIVE Sensitive     TRIMETH/SULFA <=10 SENSITIVE Sensitive     CLINDAMYCIN RESISTANT Resistant     RIFAMPIN <=0.5 SENSITIVE Sensitive     Inducible Clindamycin POSITIVE Resistant     * FEW STAPHYLOCOCCUS AUREUS  Surgical pcr screen     Status: None   Collection Time: 08/16/21  4:38 AM   Specimen: Nasal Mucosa; Nasal Swab  Result Value Ref Range Status   MRSA, PCR NEGATIVE NEGATIVE Final   Staphylococcus aureus NEGATIVE NEGATIVE Final    Comment: (NOTE) The Xpert SA Assay (FDA approved for NASAL specimens in patients 68 years of age and older), is one component  of a comprehensive surveillance program. It is not intended to diagnose infection nor to guide or monitor treatment. Performed at [facility], [place] [address] [place], [place] [address]          Radiology Studies: No results found.      Scheduled Meds:  sodium chloride   Intravenous Once   Chlorhexidine Gluconate Cloth  6 each Topical Q0600   cinacalcet  90 mg Oral Daily   ciprofloxacin  500 mg Oral QHS   [START ON 08/22/2021] darbepoetin (ARANESP) injection - DIALYSIS  150 mcg Intravenous Q Tue-HD   heparin  5,000 Units Subcutaneous Q8H   insulin aspart  0-6 Units Subcutaneous TID WC & HS   insulin glargine-yfgn  26 Units Subcutaneous  QHS   LORazepam  1 mg Intravenous Once   losartan  50 mg Oral Daily   multivitamin  1 tablet Oral QHS   sevelamer carbonate  1.6 g Oral TID WC   sodium chloride flush  3 mL Intravenous Q12H   sodium chloride flush  3 mL Intravenous Q12H   Continuous Infusions:  sodium chloride     sodium chloride 500 mL (08/14/21 2116)   vancomycin Stopped (08/15/21 1526)     LOS: 6 days   Time spent: 69mins Greater than 50% of this time was spent in counseling, explanation of diagnosis, planning of further management, and coordination of care.   Voice Recognition Viviann Spare dictation system was used to create this note, attempts have been made to correct errors. Please contact the author with questions and/or clarifications.   Florencia Reasons, MD PhD FACP [facility]  Available via Epic secure chat 7am-7pm for nonurgent issues Please page for urgent issues To page the attending provider between 7A-7P or the covering provider during after hours 7P-7A, please log into the web site [email or web address] and access using universal  password for that web site. If you do not have the password, please call the hospital operator.    08/17/2021, 11:55 AM

## 2021-08-17 NOTE — Progress Notes (Addendum)
°  Progress Note    08/17/2021 7:57 AM 1 Day Post-Op  Subjective:  right foot pain   Vitals:   08/16/21 2335 08/17/21 0331  BP: (!) 178/84 (!) 166/71  Pulse: 87 87  Resp: 17 16  Temp: 97.6 F (36.4 C)   SpO2: 97% 98%   Physical Exam: Cardiac:  regular Lungs:  non labored Incisions:  right TMA with wound VAC to suction. Good seal. No output Extremities:  Doppler right PT/ Pero signals Neurologic: alert and oriented  CBC    Component Value Date/Time   WBC 11.2 (H) 08/16/2021 0446   RBC 3.45 (L) 08/16/2021 0446   HGB 10.1 (L) 08/16/2021 0446   HGB 10.6 (L) 05/18/2020 0927   HCT 31.3 (L) 08/16/2021 0446   HCT 35.1 05/18/2020 0927   PLT 216 08/16/2021 0446   PLT 186 05/18/2020 0927   MCV 90.7 08/16/2021 0446   MCV 89 05/18/2020 0927   MCH 29.3 08/16/2021 0446   MCHC 32.3 08/16/2021 0446   RDW 17.1 (H) 08/16/2021 0446   RDW 13.9 05/18/2020 0927   LYMPHSABS 1.6 08/14/2021 0445   LYMPHSABS 1.6 05/18/2020 0927   MONOABS 0.9 08/14/2021 0445   EOSABS 0.6 (H) 08/14/2021 0445   EOSABS 0.6 (H) 05/18/2020 0927   BASOSABS 0.0 08/14/2021 0445   BASOSABS 0.0 05/18/2020 0927    BMET    Component Value Date/Time   NA 134 (L) 08/16/2021 0446   NA 133 (L) 05/18/2020 0927   K 3.9 08/16/2021 0446   CL 96 (L) 08/16/2021 0446   CO2 24 08/16/2021 0446   GLUCOSE 287 (H) 08/16/2021 0446   BUN 31 (H) 08/16/2021 0446   BUN 45 (H) 05/18/2020 0927   CREATININE 8.23 (H) 08/16/2021 0446   CALCIUM 9.3 08/16/2021 0446   GFRNONAA 6 (L) 08/16/2021 0446   GFRAA 6 (L) 05/18/2020 0927    INR    Component Value Date/Time   INR 1.2 08/10/2021 2212     Intake/Output Summary (Last 24 hours) at 08/17/2021 0757 Last data filed at 08/16/2021 2030 Gross per 24 hour  Intake 450 ml  Output 50 ml  Net 400 ml     Assessment/Plan:  41 y.o. female is s/p revision R TMA with application of incisional wound VAC 1 Day Post-Op   Pain control PRN Right lower extremity well perfused with Doppler  Pero/ PT signals TMA wound VAC will change tomorrow No output from VAC On Cipro and Vanc PT/ OT recommending HH PT/OT  DVT prophylaxis: sq heparin   [name], PA-C [facility] [phone] 08/17/2021 7:57 AM  I agree with the above.  I have seen and evaluated the patient.  I discussed with her that her TMA amputation site is marginal, and she is at high risk for requiring below-knee amputation.  She verbalized understanding.  [name]

## 2021-08-17 NOTE — Progress Notes (Signed)
Pharmacy note - antimicrobial stewardship  Asked patient about her antibiotic allergies.  She reports she had a rash to penicillin in 2008 which did not require emergent treatment in a healthcare facility.  Her antibiotic administration history in [facility] notes that she received cefazolin in 2020 without any reaction.  MSSA has grown on culture.  Secure chat with Dr. Erlinda Hong and [name] PA-C with vascular who are both OK with changing vancomycin to cefazolin after HD starting today.  Orders placed for cefazolin 2g IV after HD.  Heide Guile, PharmD, BCPS-AQ ID Clinical Pharmacist Phone [phone]

## 2021-08-17 NOTE — Care Management Important Message (Signed)
Important Message  Patient Details  Name: [name] MRN: [id number] Date of Birth: July 28, 1981   Medicare Important Message Given:  Yes     Shelda Altes 08/17/2021, 7:18 AM

## 2021-08-17 NOTE — Progress Notes (Signed)
PT Cancellation Note  Patient Details Name: [name] MRN: [id number] DOB: 1980/11/09   Cancelled Treatment:    Reason Eval/Treat Not Completed: Patient at procedure or test/unavailable this afternoon, at HD. Will continue to follow and attempt as time/schedule allows.   [place] Carbo, PT, DPT   Acute Rehabilitation Department Pager #: [phone]   Sandra Cockayne 08/17/2021, 5:26 PM

## 2021-08-18 DIAGNOSIS — I96 Gangrene, not elsewhere classified: Secondary | ICD-10-CM | POA: Diagnosis not present

## 2021-08-18 LAB — GLUCOSE, CAPILLARY
Glucose-Capillary: 210 mg/dL — ABNORMAL HIGH (ref 70–99)
Glucose-Capillary: 128 mg/dL — ABNORMAL HIGH (ref 70–99)
Glucose-Capillary: 66 mg/dL — ABNORMAL LOW (ref 70–99)
Glucose-Capillary: 144 mg/dL — ABNORMAL HIGH (ref 70–99)
Glucose-Capillary: 66 mg/dL — ABNORMAL LOW (ref 70–99)
Glucose-Capillary: 109 mg/dL — ABNORMAL HIGH (ref 70–99)

## 2021-08-18 LAB — RENAL FUNCTION PANEL
Albumin: 2 g/dL — ABNORMAL LOW (ref 3.5–5.0)
Anion gap: 12 (ref 5–15)
BUN: 27 mg/dL — ABNORMAL HIGH (ref 6–20)
CO2: 25 mmol/L (ref 22–32)
Calcium: 9.3 mg/dL (ref 8.9–10.3)
Chloride: 97 mmol/L — ABNORMAL LOW (ref 98–111)
Creatinine, Ser: 8.17 mg/dL — ABNORMAL HIGH (ref 0.44–1.00)
GFR, Estimated: 6 mL/min — ABNORMAL LOW (ref >60)
Glucose, Bld: 247 mg/dL — ABNORMAL HIGH (ref 70–99)
Phosphorus: 6.8 mg/dL — ABNORMAL HIGH (ref 2.5–4.6)
Potassium: 4.3 mmol/L (ref 3.5–5.1)
Sodium: 134 mmol/L — ABNORMAL LOW (ref 135–145)

## 2021-08-18 MED ORDER — GLUCOSE 40 % PO GEL
1.0000 | ORAL | Status: DC
  Filled 2021-08-18: qty 1

## 2021-08-18 MED ORDER — CHLORHEXIDINE GLUCONATE CLOTH 2 % EX PADS
6.0000 | MEDICATED_PAD | Freq: Every day | CUTANEOUS | Status: DC
  Administered 2021-08-22 – 2021-08-25 (×4): 6 via TOPICAL

## 2021-08-18 MED ORDER — DEXTROSE 50 % IV SOLN
12.5000 g | INTRAVENOUS | Status: AC
  Administered 2021-08-18: 12.5 g via INTRAVENOUS

## 2021-08-18 MED ORDER — DEXTROSE 50 % IV SOLN
INTRAVENOUS | Status: AC
  Filled 2021-08-18: qty 50

## 2021-08-18 NOTE — Progress Notes (Signed)
OT Cancellation Note  Patient Details Name: [name] MRN: [id number] DOB: 10/31/1980   Cancelled Treatment:    Reason Eval/Treat Not Completed: Pain limiting ability to participate. Pt declined and is grimacing in pain upon arrival. RN notified and reports that pt has had all pain meds allowed at this time. OT will follow up next available time  Britt Bottom 08/18/2021, 11:27 AM

## 2021-08-18 NOTE — Progress Notes (Signed)
[facility] Progress Note   Subjective: Last HD on 1/5 with 1.5 kg UF.  She feels tired this morning.  Denies any dizziness or cramping with HD. Arm feels better  Review of systems:  Denies shortness of breath or chest pain Denies n/v   Vitals:   08/17/21 2014 08/18/21 0016 08/18/21 0411 08/18/21 0834  BP:   (!) 152/69   Pulse: 80 86 84 86  Resp: 17 17 17 14   Temp: 98.6 F (37 C) 98.7 F (37.1 C) 98.6 F (37 C)   TempSrc: Oral Oral Oral   SpO2:  100% 98% 97%  Weight:      Height:        Physical Exam     General adult female in bed in no acute distress HEENT normocephalic atraumatic Neck supple trachea midline Lungs clear to auscultation bilaterally normal work of breathing at rest on room air Heart S1S2 no rub Abdomen soft nontender nondistended Extremities no edema appreciated lower extremities; right foot TMA with wound vac Psych normal mood and affect Neuro - alert and oriented x 3; keeps eyes closed and answers questions Access - RUE AVF with bruit and thrill   OP HD: [place] [facility]   3h 16min  400/1.5  68.2kg  2/2 bath  15g  Hep 4000  AVF  - mircera 212mcg IV q 2 weeks- last dose 12/27  - hectorol 9 mcg IV q HD   - sensipar 90mg  2 tabs po QHS   - home meds > amlodipine 10mg  QD, renvela 2.4g 1 ac tid  Assessment/ Plan: R great toe diabetic infection: s/p angiogram and 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  IV antibiotics per primary team.  ancef.  Please notify nephrology if antibiotics needed with outpatient HD.  See that at minimum six weeks of abx planned per charting.  Please confirm end date for abx if needed with outpatient HD as well.   ESRD:  HD per TTS schedule.    Hypertension/volume: improved. optimize volume with HD  Anemia CKD and blood loss: outpatient HD notes reveal pt had noticed red blood in her stools and had been referred to GI outpatient. Note pt got 2 units PRBC's for Hb 6.6 on 12/31 per charting.  Plan for Aranesp 150 mcg on 08/22/21 (when ESA next due)    Metabolic bone disease: hyperphosphatemia.  On renvela packets - increased dose to 1.6 gram TID. Hypercalcemia when corrected for albumin - 10.9 corrected as of 1/6.  We stopped high dose hectorol  We restarted sensipar here.  T2DM: hyperglycemic on admit,  had not been taking her insulin at home. Management per primary team.   Disposition - per primary team and vascular   Recent Labs  Lab 08/14/21 0445 08/15/21 0546 08/16/21 0446 08/17/21 0901 08/18/21 0500  K 3.6   < > 3.9 4.0 4.3  BUN 59*   < > 31* 42* 27*  CREATININE 11.60*   < > 8.23* 10.25* 8.17*  CALCIUM 9.3   < > 9.3 9.7 9.3  PHOS 8.3*   < > 6.5* 8.2* 6.8*  HGB 9.7*  --  10.1*  --   --    < > = values in this interval not displayed.   Inpatient medications:  sodium chloride   Intravenous Once   Chlorhexidine Gluconate Cloth  6 each Topical Q0600   cinacalcet  90 mg Oral Daily   [START ON 08/22/2021] darbepoetin (ARANESP) injection - DIALYSIS  150 mcg Intravenous Q Tue-HD   heparin  5,000 Units Subcutaneous Q8H   insulin aspart  0-6 Units Subcutaneous TID WC & HS   insulin glargine-yfgn  20 Units Subcutaneous QHS   LORazepam  1 mg Intravenous Once   losartan  50 mg Oral Daily   multivitamin  1 tablet Oral QHS   sevelamer carbonate  1.6 g Oral TID WC   sodium chloride flush  3 mL Intravenous Q12H   sodium chloride flush  3 mL Intravenous Q12H    sodium chloride     sodium chloride 500 mL (08/14/21 2116)    ceFAZolin (ANCEF) IV 2 g (08/17/21 1746)   sodium chloride, sodium chloride, acetaminophen **OR** acetaminophen, diphenhydrAMINE, fentaNYL (SUBLIMAZE) injection, hydrALAZINE, labetalol, naLOXone (NARCAN)  injection, ondansetron (ZOFRAN) IV, oxyCODONE, senna-docusate, sodium chloride flush, sodium chloride flush   Claudia Desanctis, MD 08/18/2021 9:25 AM

## 2021-08-18 NOTE — TOC Initial Note (Signed)
Transition of Care (TOC) - Initial/Assessment Note  Marvetta Gibbons RN, BSN Transitions of Care Unit 4E- RN Case Manager See Treatment Team for direct phone #    Patient Details  Name: [name] MRN: [id number] Date of Birth: [birth year]  Transition of Care [facility]) CM/SW Contact:    Dawayne Patricia, RN Phone Number: 08/18/2021, 4:58 PM  Clinical Narrative:                 Pt from home w/ spouse, s/p TMA with wound VAC placed. Per vascular today pt needs BKA and will not need home wound VAC at this time. [employer] wound VAC form on chart should something change.  Orders placed for HHRN/PT/OT- CM attempted to see pt at bedside to discuss [facility] needs however pt was in pain and unable to discuss at this time. Bedside RN called and aware of pain- working on getting IV in order to give pt medications.  TOC will continue to follow and will speak with pt at a later time for [facility] needs- list left with pt for [facility] choice Per CMS guidelines from medicare.gov website with star ratings (copy placed in shadow chart) that she can review later   Expected Discharge Plan: [place] Barriers to Discharge: Continued Medical Work up   Patient Goals and CMS Choice    pending    Expected Discharge Plan and Services Expected Discharge Plan: [place]   Discharge Planning Services: CM Consult Post Acute Care Choice: Durable Medical Equipment, Home Health Living arrangements for the past 2 months: Apartment                           HH Arranged: RN, OT, PT          Prior Living Arrangements/Services Living arrangements for the past 2 months: Apartment Lives with:: Spouse Patient language and need for interpreter reviewed:: Yes        Need for Family Participation in Patient Care: Yes (Comment) Care giver support system in place?: Yes (comment) Current home services: DME Criminal Activity/Legal Involvement Pertinent to Current Situation/Hospitalization: No -  Comment as needed  Activities of Daily Living      Permission Sought/Granted                  Emotional Assessment Appearance:: Appears older than stated age Attitude/Demeanor/Rapport: Crying Affect (typically observed): Irritable (in pain) Orientation: : Oriented to Self, Oriented to Place, Oriented to  Time, Oriented to Situation Alcohol / Substance Use: Not Applicable Psych Involvement: No (comment)  Admission diagnosis:  Gas gangrene ([place]) [A48.0] Hyperglycemia [R73.9] Sepsis ([place]) [A41.9] Gangrene of right foot ([place]) [I96] Patient Active Problem List   Diagnosis Date Noted   Hyponatremia 08/13/2021   Gangrene of right foot ([place]) 08/11/2021   Uncontrolled type 2 diabetes mellitus with hyperglycemia ([place]) 08/11/2021   Encephalopathy acute 07/01/2020   Acute encephalopathy 06/30/2020   Hypertensive urgency 06/30/2020   Blindness of right eye 06/30/2020   Type 2 diabetes mellitus with hyperglycemia, with long-term current use of insulin ([place]) 05/13/2020   Type 2 diabetes mellitus with proliferative retinopathy, with long-term current use of insulin ([place]) 05/13/2020   Fever    Hyperglycemia due to type 2 diabetes mellitus ([place]) 02/21/2019   Suspected COVID-19 virus infection 02/21/2019   ESRD on hemodialysis ([place]) 02/21/2019   Anemia of chronic disease 02/11/2019   Diabetes mellitus ([place]) 02/11/2019   HLD (hyperlipidemia) 02/11/2019  PTSD (post-traumatic stress disorder) 02/11/2019   PVD (peripheral vascular disease) ([place]) 02/11/2019   Retinopathy 02/11/2019   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

## 2021-08-18 NOTE — Progress Notes (Signed)
PROGRESS NOTE    [name]  ZOX:[id number] DOB: 26-Jan-1981 DOA: 08/10/2021 PCP: [facility]    Chief Complaint  Patient presents with   Gen. Weakness / SOB     Missed Dialysis / Hyperglycemia    Brief Narrative:  41 year old with history of type 2 diabetes on insulin, ESRD on hemodialysis TTS schedule, neuropathy, hypertension with poor compliance came to the emergency room with right foot pain and infection.  She was found to have gangrenous toes.  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 -Revision of right foot transmetatarsal amputation /placement of wound VAC on 1/4 Vascular recommended total of 6 weeks of abx treatment, patient is at risk of  BKA per vascular surgery -she Was on vanc and cipro, -Wound culture +  MSSA, ID pharmacy discussed with vascular, now abx changed to  ancef - will coordinate with nephrology regarding ancef post HD -Appreciate ID pharmacy, nephrology ,vascular surgery input  ESRD on HD, TTS, anemia of chronic disease Status post 2 PRBC transfusion on 12/31, hgb has been stable post transfusion Management per nephrology  Uncontrolled insulin-dependent diabetes, 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 Hardie Lora on last on 09/12/2020, she was prescribed dexcom at that time  Hypertension Blood pressure stable on home medication Cozaar  Body mass index is 28.74 kg/m.Marland Kitchen      Unresulted Labs (From admission, onward)     Start     Ordered   08/19/21 0500  Renal function panel  Tomorrow morning,   R       Question:  Specimen collection method  Answer:  Lab=Lab collect   08/18/21 0913   08/19/21 0500  CBC  Tomorrow morning,   R       Question:  Specimen collection method  Answer:  Lab=Lab collect   08/18/21 0913              DVT prophylaxis: heparin injection 5,000 Units Start: 08/11/21 0600   Code Status: Full Family Communication: she give me permission to call her husband, will call Disposition:   Status is: Inpatient  Dispo: The patient is from: Home              Anticipated d/c is to: TBD, Possible home health vs snf pending Pt eval              Anticipated d/c date is: TBD, need vascular surgery clearance , she may end up with right BKA               Consultants:  Vascular surgery Nephrology  Procedures:  Dialysis Status post open right foot amputation on 12/30 -Revision of right foot transmetatarsal amputation /placement of wound VAC on 1/4  Antimicrobials:    Anti-infectives (From admission, onward)    Start     Dose/Rate Route Frequency Ordered Stop   08/17/21 2200  ciprofloxacin (CIPRO) tablet 500 mg  Status:  Discontinued        500 mg Oral Daily at bedtime 08/17/21 0928 08/17/21 1330   08/17/21 1800  ceFAZolin (ANCEF) IVPB 2g/100 mL premix        2 g 200 mL/hr over 30 Minutes Intravenous Every T-Th-Sa (1800) 08/17/21 1303 09/26/21 2359   08/12/21 1200  vancomycin (VANCOREADY) IVPB 750 mg/150 mL  Status:  Discontinued        750 mg 150 mL/hr over 60 Minutes Intravenous Every T-Th-Sa (Hemodialysis) 08/11/21 0238 08/17/21 1303   08/11/21 2200  ciprofloxacin (CIPRO) IVPB 400 mg  Status:   Discontinued       Note to Pharmacy: Cipro 400 mg IV q24h for Cr Cl < 30 mL/min   400 mg 200 mL/hr over 60 Minutes Intravenous Every 24 hours 08/11/21 0321 08/17/21 0928   08/11/21 1000  ciprofloxacin (CIPRO) IVPB 400 mg  Status:  Discontinued        400 mg 200 mL/hr over 60 Minutes Intravenous Every 12 hours 08/11/21 0316 08/11/21 0321   08/11/21 0600  clindamycin (CLEOCIN) IVPB 600 mg  Status:  Discontinued        600 mg 100 mL/hr over 30 Minutes Intravenous Every 8 hours 08/11/21 0316 08/14/21 0912   08/11/21 0100  vancomycin (VANCOREADY) IVPB 1500 mg/300 mL        1,500 mg 150 mL/hr over 120 Minutes Intravenous  Once 08/11/21 0014 08/11/21 0404   08/11/21 0015  ciprofloxacin (CIPRO) IVPB 400 mg        400 mg 200 mL/hr over 60 Minutes Intravenous  Once 08/11/21 0014 08/11/21 0202   08/11/21 0015  clindamycin (CLEOCIN) IVPB 600 mg        600 mg 100 mL/hr over 30 Minutes Intravenous  Once 08/11/21 0014 08/11/21 0057          Objective: Vitals:   08/18/21 0411 08/18/21 0834 08/18/21 1003 08/18/21 1126  BP: (!) 152/69  (!) 159/71 (!) 159/71  Pulse: 84 86 80 85  Resp: 17 14 10 16   Temp: 98.6 F (37 C)  98 F (36.7 C) 98 F (36.7 C)  TempSrc: Oral  Oral Oral  SpO2: 98% 97% 98% 98%  Weight:      Height:        Intake/Output Summary (Last 24 hours) at 08/18/2021 1418 Last data filed at 08/18/2021 4098 Gross per 24 hour  Intake 120 ml  Output 1489 ml  Net -1369 ml   Filed Weights   08/16/21 0431 08/17/21 0331 08/17/21 1402  Weight: 63.7 kg 67.9 kg 69 kg    Examination:  General exam: she is aaox3 , she choose not to engage in conversation much today, right eye blind Respiratory system: .  Clear to auscultation bilaterally ,respiratory effort normal. Cardiovascular system:  RRR.  Gastrointestinal system: Abdomen is nondistended, soft and nontender.  Normal bowel sounds heard. Central nervous system: Alert and oriented. No focal neurological deficits. Extremities:  +  Amputation right foot, wound vac removed, + dressing  Skin: No rashes, lesions or ulcers Psychiatry: Flat affect, no agitation.     Data Reviewed: I have personally reviewed following labs and imaging studies  CBC: Recent Labs  Lab 08/12/21 1123 08/13/21 0856 08/14/21 0445 08/16/21 0446  WBC 10.6* 12.9* 14.6* 11.2*  NEUTROABS  --   --  11.3*  --   HGB 6.6* 10.1* 9.7* 10.1*  HCT 20.9* 31.2* 29.7* 31.3*  MCV 91.7 90.2 90.0 90.7  PLT 259 229 244 564    Basic Metabolic Panel: Recent Labs  Lab 08/14/21 0445 08/15/21 0546 08/16/21 0446 08/17/21 0901 08/18/21 0500  NA 131* 135 134* 134* 134*  K 3.6 4.2 3.9 4.0 4.3  CL 90* 95* 96* 99 97*  CO2 24 21* 24 23 25   GLUCOSE 316* 216* 287* 146* 247*  BUN 59* 68* 31* 42* 27*  CREATININE 11.60* 13.83* 8.23* 10.25* 8.17*  CALCIUM 9.3 9.4 9.3 9.7 9.3  PHOS 8.3* 9.3* 6.5* 8.2* 6.8*    GFR: Estimated Creatinine Clearance: 8.2 mL/min (A) (by C-G formula based on SCr of 8.17 mg/dL (H)).  Liver Function Tests: Recent Labs  Lab 08/14/21 0445 08/15/21 0546 08/16/21 0446 08/17/21 0901 08/18/21 0500  ALBUMIN 2.0* 2.0* 2.0* 1.9* 2.0*    CBG: Recent Labs  Lab 08/17/21 0642 08/17/21 1146 08/17/21 2017 08/18/21 0624 08/18/21 1129  GLUCAP 188* 81 141* 210* 128*     Recent Results (from the past 240 hour(s))  Blood Culture (routine x 2)     Status: Abnormal   Collection Time: 08/10/21  9:56 PM   Specimen: BLOOD  Result Value Ref Range Status   Specimen Description BLOOD  Final   Special Requests   Final    BOTTLES DRAWN AEROBIC AND ANAEROBIC Blood Culture adequate volume   Culture  Setup Time   Final    GRAM POSITIVE COCCI AEROBIC BOTTLE ONLY CRITICAL RESULT CALLED TO, READ BACK BY AND VERIFIED WITH: PHARMD GREG ABBOTT 08/12/21@00 :32 BY TW    Culture (A)  Final    STAPHYLOCOCCUS EPIDERMIDIS THE SIGNIFICANCE OF ISOLATING THIS ORGANISM FROM A SINGLE SET OF BLOOD CULTURES WHEN MULTIPLE SETS ARE DRAWN IS UNCERTAIN. PLEASE  NOTIFY THE MICROBIOLOGY DEPARTMENT WITHIN ONE WEEK IF SPECIATION AND SENSITIVITIES ARE REQUIRED. Performed at [facility], [place] [address] [place], [place] [address]    Report Status 08/14/2021 FINAL  Final  Blood Culture ID Panel (Reflexed)     Status: Abnormal   Collection Time: 08/10/21  9:56 PM  Result Value Ref Range Status   Enterococcus faecalis NOT DETECTED NOT DETECTED Final   Enterococcus Faecium NOT DETECTED NOT DETECTED Final   Listeria monocytogenes NOT DETECTED NOT DETECTED Final   Staphylococcus species DETECTED (A) NOT DETECTED Final    Comment: CRITICAL RESULT CALLED TO, READ BACK BY AND VERIFIED WITH: PHARMD GREG ABBOTT 08/12/21@00 :32 BY TW    Staphylococcus aureus (BCID) NOT DETECTED NOT DETECTED Final   Staphylococcus epidermidis DETECTED (A) NOT DETECTED Final    Comment: Methicillin (oxacillin) resistant coagulase negative staphylococcus. Possible blood culture contaminant (unless isolated from more than one blood culture draw or clinical case suggests pathogenicity). No antibiotic treatment is indicated for blood  culture contaminants. CRITICAL RESULT CALLED TO, READ BACK BY AND VERIFIED WITH: PHARMD GREG ABBOTT 08/12/21@00 :32 BY TW    Staphylococcus lugdunensis NOT DETECTED NOT DETECTED Final   Streptococcus species NOT DETECTED NOT DETECTED Final   Streptococcus agalactiae NOT DETECTED NOT DETECTED Final   Streptococcus pneumoniae NOT DETECTED NOT DETECTED Final   Streptococcus pyogenes NOT DETECTED NOT DETECTED Final   A.calcoaceticus-baumannii NOT DETECTED NOT DETECTED Final   Bacteroides fragilis NOT DETECTED NOT DETECTED Final   Enterobacterales NOT DETECTED NOT DETECTED Final   Enterobacter cloacae complex NOT DETECTED NOT DETECTED Final   Escherichia coli NOT DETECTED NOT DETECTED Final   Klebsiella  aerogenes NOT DETECTED NOT DETECTED Final   Klebsiella oxytoca NOT DETECTED NOT DETECTED Final   Klebsiella pneumoniae NOT DETECTED NOT DETECTED  Final   Proteus species NOT DETECTED NOT DETECTED Final   Salmonella species NOT DETECTED NOT DETECTED Final   Serratia marcescens NOT DETECTED NOT DETECTED Final   Haemophilus influenzae NOT DETECTED NOT DETECTED Final   Neisseria meningitidis NOT DETECTED NOT DETECTED Final   Pseudomonas aeruginosa NOT DETECTED NOT DETECTED Final   Stenotrophomonas maltophilia NOT DETECTED NOT DETECTED Final   Candida albicans NOT DETECTED NOT DETECTED Final   Candida auris NOT DETECTED NOT DETECTED Final   Candida glabrata NOT DETECTED NOT DETECTED Final   Candida krusei NOT DETECTED NOT DETECTED Final   Candida parapsilosis NOT DETECTED NOT DETECTED Final   Candida tropicalis NOT DETECTED NOT DETECTED Final   Cryptococcus neoformans/gattii NOT DETECTED NOT DETECTED Final   Methicillin resistance mecA/C DETECTED (A) NOT DETECTED Final    Comment: CRITICAL RESULT CALLED TO, READ BACK BY AND VERIFIED WITH: PHARMD GREG ABBOTT 08/12/21@00 :32 BY TW Performed at [facility], [address] [place], [place] [address]   Blood Culture (routine x 2)     Status: None   Collection Time: 08/10/21 10:12 PM   Specimen: BLOOD LEFT HAND  Result Value Ref Range Status   Specimen Description BLOOD LEFT HAND  Final   Special Requests   Final    BOTTLES DRAWN AEROBIC AND ANAEROBIC Blood Culture adequate volume   Culture   Final    NO GROWTH 5 DAYS Performed at [facility], [place] [address] [place], [place] [address]    Report Status 08/15/2021 FINAL  Final  Resp Panel by RT-PCR (Flu A&B, Covid) Nasopharyngeal Swab     Status: None   Collection Time: 08/10/21 11:19 PM   Specimen: Nasopharyngeal Swab; Nasopharyngeal(NP) swabs in vial transport medium  Result Value Ref Range Status   SARS Coronavirus 2 by RT PCR NEGATIVE NEGATIVE Final    Comment: (NOTE) SARS-CoV-2 target nucleic acids are NOT DETECTED.  The SARS-CoV-2 RNA is generally detectable in upper respiratory specimens during the acute phase  of infection. The lowest concentration of SARS-CoV-2 viral copies this assay can detect is 138 copies/mL. A negative result does not preclude SARS-Cov-2 infection and should not be used as the sole basis for treatment or other patient management decisions. A negative result may occur with  improper specimen collection/handling, submission of specimen other than nasopharyngeal swab, presence of viral mutation(s) within the areas targeted by this assay, and inadequate number of viral copies(<138 copies/mL). A negative result must be combined with clinical observations, patient history, and epidemiological information. The expected result is Negative.  Fact Sheet for Patients:  EntrepreneurPulse.com.au  Fact Sheet for Healthcare Providers:  IncredibleEmployment.be  This test is no t yet approved or cleared by the [place] FDA and  has been authorized for detection and/or diagnosis of SARS-CoV-2 by FDA under an Emergency Use Authorization (EUA). This EUA will remain  in effect (meaning this test can be used) for the duration of the COVID-19 declaration under Section 564(b)(1) of the Act, 21 U.S.C.section 360bbb-3(b)(1), unless the authorization is terminated  or revoked sooner.       Influenza A by PCR NEGATIVE NEGATIVE Final   Influenza B by PCR NEGATIVE NEGATIVE Final    Comment: (NOTE) The Xpert Xpress SARS-CoV-2/FLU/RSV plus assay is intended as an aid in the diagnosis of influenza from Nasopharyngeal swab specimens and should not be  used as a sole basis for treatment. Nasal washings and aspirates are unacceptable for Xpert Xpress SARS-CoV-2/FLU/RSV testing.  Fact Sheet for Patients: EntrepreneurPulse.com.au  Fact Sheet for Healthcare Providers: IncredibleEmployment.be  This test is not yet approved or cleared by the [place] FDA and has been authorized for detection and/or diagnosis of  SARS-CoV-2 by FDA under an Emergency Use Authorization (EUA). This EUA will remain in effect (meaning this test can be used) for the duration of the COVID-19 declaration under Section 564(b)(1) of the Act, 21 U.S.C. section 360bbb-3(b)(1), unless the authorization is terminated or revoked.  Performed at [facility], [place] [address], [place], [place] [address]   Aerobic Culture w Gram Stain (superficial specimen)     Status: None   Collection Time: 08/11/21  5:10 PM   Specimen: Wound  Result Value Ref Range Status   Specimen Description WOUND RIGHT TOE  Final   Special Requests RT BIG TOE  Final   Gram Stain   Final    FEW FEW GRAM POSITIVE COCCI FEW GRAM POSITIVE RODS RARE GRAM NEGATIVE RODS RARE GRAM NEGATIVE COCCOBACILLI Performed at [facility], [place] [address] [place], [place] [address]    Culture FEW STAPHYLOCOCCUS AUREUS  Final   Report Status 08/14/2021 FINAL  Final   Organism ID, Bacteria STAPHYLOCOCCUS AUREUS  Final      Susceptibility   Staphylococcus aureus - MIC*    CIPROFLOXACIN <=0.5 SENSITIVE Sensitive     ERYTHROMYCIN >=8 RESISTANT Resistant     GENTAMICIN <=0.5 SENSITIVE Sensitive     OXACILLIN 0.5 SENSITIVE Sensitive     TETRACYCLINE <=1 SENSITIVE Sensitive     VANCOMYCIN <=0.5 SENSITIVE Sensitive     TRIMETH/SULFA <=10 SENSITIVE Sensitive     CLINDAMYCIN RESISTANT Resistant     RIFAMPIN <=0.5 SENSITIVE Sensitive     Inducible Clindamycin POSITIVE Resistant     * FEW STAPHYLOCOCCUS AUREUS  Surgical pcr screen     Status: None   Collection Time: 08/16/21  4:38 AM   Specimen: Nasal Mucosa; Nasal Swab  Result Value Ref Range Status   MRSA, PCR NEGATIVE NEGATIVE Final   Staphylococcus aureus NEGATIVE NEGATIVE Final    Comment: (NOTE) The Xpert SA Assay (FDA approved for NASAL specimens in patients 18 years of age and older), is one component of a comprehensive surveillance program. It is not intended to diagnose infection nor to guide or  monitor treatment. Performed at [facility], [place] [address] [place], [place] [address]          Radiology Studies: No results found.      Scheduled Meds:  sodium chloride   Intravenous Once   Chlorhexidine Gluconate Cloth  6 each Topical Q0600   cinacalcet  90 mg Oral Daily   [START ON 08/22/2021] darbepoetin (ARANESP) injection - DIALYSIS  150 mcg Intravenous Q Tue-HD   heparin  5,000 Units Subcutaneous Q8H   insulin aspart  0-6 Units Subcutaneous TID WC & HS   insulin glargine-yfgn  20 Units Subcutaneous QHS   LORazepam  1 mg Intravenous Once   losartan  50 mg Oral Daily   multivitamin  1 tablet Oral QHS   sevelamer carbonate  1.6 g Oral TID WC   sodium chloride flush  3 mL Intravenous Q12H   sodium chloride flush  3 mL Intravenous Q12H   Continuous Infusions:  sodium chloride     sodium chloride 500 mL (08/14/21 2116)    ceFAZolin (ANCEF) IV 2  g (08/17/21 1746)     LOS: 7 days   Time spent: 68mins Greater than 50% of this time was spent in counseling, explanation of diagnosis, planning of further management, and coordination of care.   Voice Recognition Viviann Spare dictation system was used to create this note, attempts have been made to correct errors. Please contact the author with questions and/or clarifications.   Florencia Reasons, MD PhD FACP [facility]  Available via Epic secure chat 7am-7pm for nonurgent issues Please page for urgent issues To page the attending provider between 7A-7P or the covering provider during after hours 7P-7A, please log into the web site [email or web address] and access using universal [place] password for that web site. If you do not have the password, please call the hospital operator.    08/18/2021, 2:18 PM

## 2021-08-18 NOTE — Progress Notes (Signed)
Patient evening BG 66. RN gave patient some juice. Will recheck

## 2021-08-18 NOTE — Progress Notes (Signed)
Physical Therapy Treatment Patient Details Name: [name] MRN: [id number] DOB: 1980/09/29 Today's Date: 08/18/2021   History of Present Illness 41 year old with history of type 2 diabetes on insulin, ESRD on hemodialysis TTS schedule, neuropathy, hypertension, R eye blindness, with poor compliance came to the emergency room with right foot pain and infection.  She was found to have gangrenous toes.  Underwent angiogram and urgent transmetatarsal amputation.    PT Comments    The pt was agreeable to session with max encouragement, but eventually declined any OOB transfers due to reports of throbbing pain in R foot. The pt was able to complete LE exercises against min resistance for LLE and with assist against gravity with RLE. The pt did perform rocking while sitting EOB in preparation for attempt to stand, but was unable to attempt powering up with LLE or BUE and then repeated "I can't do it" until assisted back into bed. The pt was able to assist with repositioning in the bed with minA, but will continue to benefit from skilled PT to attempt to progress capacity for transfers to allow for safe d/c home. At this time, pt continues to require DME below to safely return home.     Recommendations for follow up therapy are one component of a multi-disciplinary discharge planning process, led by the attending physician.  Recommendations may be updated based on patient status, additional functional criteria and insurance authorization.  Follow Up Recommendations  Home health PT     Assistance Recommended at Discharge Frequent or constant Supervision/Assistance  Patient can return home with the following Assist for transportation;A lot of help with walking and/or transfers;Other (comment) (DME to allow pt to stay on main level)   Equipment Recommendations  Wheelchair (measurements PT);Wheelchair cushion (measurements PT);Rolling walker (2 wheels);BSC/3in1;Hospital bed    Recommendations for Other  Services       Precautions / Restrictions Precautions Precautions: Fall Required Braces or Orthoses: Other Brace Other Brace: Darco shoe R LE Restrictions Weight Bearing Restrictions: Yes RUE Weight Bearing: Weight bearing as tolerated RLE Weight Bearing: Partial weight bearing RLE Partial Weight Bearing Percentage or Pounds: heel only     Mobility  Bed Mobility Overal bed mobility: Needs Assistance Bed Mobility: Rolling;Sidelying to Sit;Sit to Sidelying Rolling: Min assist Sidelying to sit: Mod assist     Sit to sidelying: Min assist General bed mobility comments: The pt needed cues to use bed rails, then modA to complete sidelying to sitting. max cues to reposition to supine and minA to scoot up in bed    Transfers                   General transfer comment: pt refused, attempted x2 with RW but pt only rocking for momentum then not pushing with LLE or either UE and stating "I can't do it" repeatedly. pt encouraged to try, but continued to decline due to pain        Balance Overall balance assessment: Needs assistance   Sitting balance-Leahy Scale: Good Sitting balance - Comments: able to lean to reposition mesh underwear in sitting, no LOB                                    Cognition Arousal/Alertness: Awake/alert Behavior During Therapy: WFL for tasks assessed/performed;Restless Overall Cognitive Status: No family/caregiver present to determine baseline cognitive functioning  General Comments: pt with poor insight, reports desire to return home and able to follow cues, but became anxious and worked up when asked about progressing mobility and then was unable to be redirected. Pt able to voice needs but then reliant on cues for technique and motivation        Exercises General Exercises - Lower Extremity Long Arc Quad: AROM;Strengthening;Both;5 reps;10 reps;Seated (x5 AROM, x10 against min  resistance) Heel Slides: AROM;Strengthening;Both;5 reps;10 reps;Seated (x5 AROM, x10 against min resistance) Hip Flexion/Marching: AROM;Both;10 reps;Seated    General Comments General comments (skin integrity, edema, etc.): VSS on RA.      Pertinent Vitals/Pain Pain Assessment: Faces Faces Pain Scale: Hurts whole lot Pain Location: R foot Pain Descriptors / Indicators: Sore Pain Intervention(s): Limited activity within patient's tolerance;Monitored during session;Patient requesting pain meds-RN notified     PT Goals (current goals can now be found in the care plan section) Acute Rehab PT Goals Patient Stated Goal: to go home PT Goal Formulation: With patient Time For Goal Achievement: 08/29/21 Potential to Achieve Goals: Fair Progress towards PT goals: Not progressing toward goals - comment (pt reports pain limiting factor)    Frequency    Min 3X/week      PT Plan Current plan remains appropriate       AM-PAC PT "6 Clicks" Mobility   Outcome Measure  Help needed turning from your back to your side while in a flat bed without using bedrails?: None Help needed moving from lying on your back to sitting on the side of a flat bed without using bedrails?: None Help needed moving to and from a bed to a chair (including a wheelchair)?: A Lot Help needed standing up from a chair using your arms (e.g., wheelchair or bedside chair)?: A Lot Help needed to walk in hospital room?: Total Help needed climbing 3-5 steps with a railing? : Total 6 Click Score: 14    End of Session Equipment Utilized During Treatment: Gait belt Activity Tolerance: Patient limited by pain Patient left: in bed;with call bell/phone within reach;with bed alarm set Nurse Communication: Mobility status;Patient requests pain meds PT Visit Diagnosis: Other abnormalities of gait and mobility (R26.89);Muscle weakness (generalized) (M62.81);Pain Pain - Right/Left: Right Pain - part of body: Ankle and joints of  foot     Time: 7782-4235 PT Time Calculation (min) (ACUTE ONLY): 27 min  Charges:  $Therapeutic Exercise: 8-22 mins $Therapeutic Activity: 8-22 mins                     [place] Carbo, PT, DPT   Acute Rehabilitation Department Pager #: [phone]   Sandra Cockayne 08/18/2021, 10:43 AM

## 2021-08-18 NOTE — Progress Notes (Signed)
12.5 g dextrose given. Patient would not eat/drink. BG now 144

## 2021-08-18 NOTE — Progress Notes (Signed)
PHARMACY CONSULT NOTE FOR:  cefazolin with hemodialysis  OUTPATIENT  PARENTERAL ANTIBIOTIC THERAPY (OPAT)  Indication: osteomyelitis Regimen: Cefazolin 2g IV three times weekly with dialysis End date: 09/26/21  IV antibiotic discharge orders are pended. To discharging provider:  please sign these orders via discharge navigator,  Select New Orders & click on the button choice - Manage This Unsigned Work.     Thank you for allowing pharmacy to be a part of this patient's care.  Candie Mile 08/18/2021, 10:27 AM

## 2021-08-18 NOTE — Progress Notes (Addendum)
°  Progress Note    08/18/2021 11:15 AM 2 Days Post-Op  Subjective:  pain R foot   Vitals:   08/18/21 0834 08/18/21 1003  BP:  (!) 159/71  Pulse: 86 80  Resp: 14 10  Temp:  98 F (36.7 C)  SpO2: 97% 98%   Physical Exam: Lungs:  non labored Neurologic: A&O     CBC    Component Value Date/Time   WBC 11.2 (H) 08/16/2021 0446   RBC 3.45 (L) 08/16/2021 0446   HGB 10.1 (L) 08/16/2021 0446   HGB 10.6 (L) 05/18/2020 0927   HCT 31.3 (L) 08/16/2021 0446   HCT 35.1 05/18/2020 0927   PLT 216 08/16/2021 0446   PLT 186 05/18/2020 0927   MCV 90.7 08/16/2021 0446   MCV 89 05/18/2020 0927   MCH 29.3 08/16/2021 0446   MCHC 32.3 08/16/2021 0446   RDW 17.1 (H) 08/16/2021 0446   RDW 13.9 05/18/2020 0927   LYMPHSABS 1.6 08/14/2021 0445   LYMPHSABS 1.6 05/18/2020 0927   MONOABS 0.9 08/14/2021 0445   EOSABS 0.6 (H) 08/14/2021 0445   EOSABS 0.6 (H) 05/18/2020 0927   BASOSABS 0.0 08/14/2021 0445   BASOSABS 0.0 05/18/2020 0927    BMET    Component Value Date/Time   NA 134 (L) 08/18/2021 0500   NA 133 (L) 05/18/2020 0927   K 4.3 08/18/2021 0500   CL 97 (L) 08/18/2021 0500   CO2 25 08/18/2021 0500   GLUCOSE 247 (H) 08/18/2021 0500   BUN 27 (H) 08/18/2021 0500   BUN 45 (H) 05/18/2020 0927   CREATININE 8.17 (H) 08/18/2021 0500   CALCIUM 9.3 08/18/2021 0500   GFRNONAA 6 (L) 08/18/2021 0500   GFRAA 6 (L) 05/18/2020 0927    INR    Component Value Date/Time   INR 1.2 08/10/2021 2212     Intake/Output Summary (Last 24 hours) at 08/18/2021 1115 Last data filed at 08/18/2021 0160 Gross per 24 hour  Intake 120 ml  Output 1489 ml  Net -1369 ml     Assessment/Plan:  41 y.o. female is s/p TMA 2 Days Post-Op   Vac removed and TMA not well appearing; TMA is boggy Patient will likely require BKA; I discussed this with her today however she is not yet ready to accept this Can do daily dressing changes for now with gauze, kerlex, and ACE   Dagoberto Ligas, PA-C [facility] [phone] 08/18/2021 11:15 AM  I agree with the above.  Not surprisingly, her transmetatarsal amputation site does not look like it is healing.  I suspect that she is going to require a transtibial amputation.  She will be monitored over the weekend and we will have further discussions with the patient next week  Wells Lebert Lovern

## 2021-08-19 DIAGNOSIS — I96 Gangrene, not elsewhere classified: Secondary | ICD-10-CM | POA: Diagnosis not present

## 2021-08-19 LAB — GLUCOSE, CAPILLARY
Glucose-Capillary: 53 mg/dL — ABNORMAL LOW (ref 70–99)
Glucose-Capillary: 172 mg/dL — ABNORMAL HIGH (ref 70–99)
Glucose-Capillary: 183 mg/dL — ABNORMAL HIGH (ref 70–99)
Glucose-Capillary: 144 mg/dL — ABNORMAL HIGH (ref 70–99)

## 2021-08-19 LAB — RENAL FUNCTION PANEL
Albumin: 1.7 g/dL — ABNORMAL LOW (ref 3.5–5.0)
Anion gap: 15 (ref 5–15)
BUN: 47 mg/dL — ABNORMAL HIGH (ref 6–20)
CO2: 22 mmol/L (ref 22–32)
Calcium: 8.5 mg/dL — ABNORMAL LOW (ref 8.9–10.3)
Chloride: 96 mmol/L — ABNORMAL LOW (ref 98–111)
Creatinine, Ser: 11.63 mg/dL — ABNORMAL HIGH (ref 0.44–1.00)
GFR, Estimated: 4 mL/min — ABNORMAL LOW (ref >60)
Glucose, Bld: 186 mg/dL — ABNORMAL HIGH (ref 70–99)
Phosphorus: 9.6 mg/dL — ABNORMAL HIGH (ref 2.5–4.6)
Potassium: 4.2 mmol/L (ref 3.5–5.1)
Sodium: 133 mmol/L — ABNORMAL LOW (ref 135–145)

## 2021-08-19 LAB — CBC
HCT: 27.5 % — ABNORMAL LOW (ref 36.0–46.0)
Hemoglobin: 8.6 g/dL — ABNORMAL LOW (ref 12.0–15.0)
MCH: 28.5 pg (ref 26.0–34.0)
MCHC: 31.3 g/dL (ref 30.0–36.0)
MCV: 91.1 fL (ref 80.0–100.0)
Platelets: 210 10*3/uL (ref 150–400)
RBC: 3.02 MIL/uL — ABNORMAL LOW (ref 3.87–5.11)
RDW: 15.9 % — ABNORMAL HIGH (ref 11.5–15.5)
WBC: 12.6 10*3/uL — ABNORMAL HIGH (ref 4.0–10.5)
nRBC: 0 % (ref 0.0–0.2)

## 2021-08-19 MED ORDER — DIPHENHYDRAMINE HCL 25 MG PO CAPS
25.0000 mg | ORAL_CAPSULE | Freq: Four times a day (QID) | ORAL | Status: DC | PRN
  Administered 2021-08-21: 25 mg via ORAL
  Filled 2021-08-19: qty 1

## 2021-08-19 NOTE — Progress Notes (Addendum)
[facility] Progress Note   Subjective:   Patient seen and examined at bedside.  Pain currently well controlled.  Denies CP, SOB, abdominal pain and n/v/d.    Objective Vitals:   08/18/21 1647 08/18/21 1923 08/18/21 2352 08/19/21 0304  BP: 129/62 (!) 152/79 (!) 159/71 (!) 144/66  Pulse: 75 77 88 88  Resp: 14 17 16 15   Temp: 98 F (36.7 C) 97.9 F (36.6 C) 99 F (37.2 C) 98.9 F (37.2 C)  TempSrc: Oral Oral Oral Oral  SpO2: 98% 100% 95% 97%  Weight:    69.1 kg  Height:       Physical Exam General:chronically ill appearing female in NAD Heart:RRR, no mrg Lungs:CTAB, nml WOB Abdomen:soft, NTND Extremities:R TMA dressed, no LE edema Dialysis Access: RU AVR +b/t   Filed Weights   08/17/21 0331 08/17/21 1402 08/19/21 0304  Weight: 67.9 kg 69 kg 69.1 kg    Intake/Output Summary (Last 24 hours) at 08/19/2021 0930 Last data filed at 08/19/2021 0630 Gross per 24 hour  Intake 360 ml  Output 0 ml  Net 360 ml    Additional Objective Labs: Basic Metabolic Panel: Recent Labs  Lab 08/16/21 0446 08/17/21 0901 08/18/21 0500  NA 134* 134* 134*  K 3.9 4.0 4.3  CL 96* 99 97*  CO2 24 23 25   GLUCOSE 287* 146* 247*  BUN 31* 42* 27*  CREATININE 8.23* 10.25* 8.17*  CALCIUM 9.3 9.7 9.3  PHOS 6.5* 8.2* 6.8*   Liver Function Tests: Recent Labs  Lab 08/16/21 0446 08/17/21 0901 08/18/21 0500  ALBUMIN 2.0* 1.9* 2.0*  CBC: Recent Labs  Lab 08/12/21 1123 08/13/21 0856 08/14/21 0445 08/16/21 0446  WBC 10.6* 12.9* 14.6* 11.2*  NEUTROABS  --   --  11.3*  --   HGB 6.6* 10.1* 9.7* 10.1*  HCT 20.9* 31.2* 29.7* 31.3*  MCV 91.7 90.2 90.0 90.7  PLT 259 229 244 216    CBG: Recent Labs  Lab 08/18/21 1650 08/18/21 1732 08/18/21 1824 08/18/21 2040 08/19/21 0629  GLUCAP 66* 66* 144* 109* 53*   Medications:  sodium chloride     sodium chloride 500 mL (08/14/21 2116)    ceFAZolin (ANCEF) IV 2 g (08/17/21 1746)    sodium chloride   Intravenous Once    Chlorhexidine Gluconate Cloth  6 each Topical Q0600   Chlorhexidine Gluconate Cloth  6 each Topical Q0600   cinacalcet  90 mg Oral Daily   [START ON 08/22/2021] darbepoetin (ARANESP) injection - DIALYSIS  150 mcg Intravenous Q Tue-HD   heparin  5,000 Units Subcutaneous Q8H   insulin aspart  0-6 Units Subcutaneous TID WC & HS   insulin glargine-yfgn  20 Units Subcutaneous QHS   LORazepam  1 mg Intravenous Once   losartan  50 mg Oral Daily   multivitamin  1 tablet Oral QHS   sevelamer carbonate  1.6 g Oral TID WC   sodium chloride flush  3 mL Intravenous Q12H   sodium chloride flush  3 mL Intravenous Q12H    D[facility] Orders: [facility]   3h 56min  400/1.5  68.2kg  2/2 bath  15g  Hep 4000  AVF  - mircera 222mcg IV q 2 weeks- last dose 12/27  - hectorol 9 mcg IV q HD   - sensipar 90mg  2 tabs po QHS   - home meds > amlodipine 10mg  QD, renvela 2.4g 1 ac tid   Assessment/ Plan: R great toe diabetic infection: s/p angiogram and R TMA on  12/30.  IV antibiotics per primary team.  Currently on Ancef 2g IV qHD x 6 weeks with end date 2/14.  Per [facility] notes will likely need R BKA next week as TMA is not healing.  ESRD:  [facility] TTS.  HD today per regular schedule.   Hypertension/volume: BP mildly elevated this AM.  On losartan 50mg  qd.  Does not appear volume overloaded, UF as tolerated. Getting under dry wt, will need to be lowered in d/c.   Anemia CKD and blood loss: outpatient HD notes reveal pt had noticed red blood in her stools and had been referred to GI outpatient. Hgb 6.6 on admit, last improved to 10.1 s/p 2 units PRBC's on 12/31 Plan for Aranesp 150 mcg on 08/22/21 (when ESA next due)   Metabolic bone disease: +hyperphosphatemia.  On renvela packets - increased dose to 1.6 gram TID. Hypercalcemia when corrected for albumin - 10.9 corrected as of 1/6.  We stopped high dose hectorol.   We restarted sensipar here. Use 2.0 Ca bath.  T2DM: hyperglycemic on admit,  had not been taking her insulin at home.  Management per primary team. Nutrition - Renal/carb modified diet w/fluid restrictions.   Jen Mow, PA-C [facility] 08/19/2021,9:30 AM  LOS: 8 days    Seen and examined independently.  Agree with note and exam as documented above by physician extender and as noted here.  Labs ordered for this am - still not collected.   General adult female in bed in no acute distress HEENT normocephalic atraumatic Neck supple trachea midline Lungs clear to auscultation bilaterally normal work of breathing at rest on room air Heart S1S2 no rub Abdomen soft nontender nondistended Extremities no edema appreciated lower extremities; right foot TMA Psych normal mood and affect Neuro - alert and oriented x 3; keeps eyes closed and answers questions Access - RUE AVF with bruit and thrill  Right great toe diabetic foot infection s/p right TMA and now for likely right BKA next week. On Ancef 2g IV qHD x 6 weeks with end date 2/14.  We can give this with outpatient HD  ESRD - HD per TTS schedule. Labs for this am still not collected - defer labs today and instead get in am  Metabolic bone disease - phos a little better with binder dose increase   Anemia CKD - last assessed 1/4.   T2DM - per primary team   Claudia Desanctis, MD 08/19/2021 12:22 PM

## 2021-08-19 NOTE — Progress Notes (Signed)
PROGRESS NOTE    [name]  PFX:[id number] DOB: Aug 04, 1981 DOA: 08/10/2021 PCP: [facility]    Chief Complaint  Patient presents with   Gen. Weakness / SOB     Missed Dialysis / Hyperglycemia    Brief Narrative:  41 year old with history of type 2 diabetes on insulin, ESRD on hemodialysis TTS schedule, neuropathy, hypertension with poor compliance came to the emergency room with right foot pain and infection.  She was found to have gangrenous toes.  Underwent angiogram and urgent transmetatarsal amputation  Subjective:  No acute event overnight , vital signs are stable  Today She choose not to engage much today, she reports pain is better today    Assessment & Plan:   Principal Problem:   Gangrene of right foot ([place]) Active Problems:   Anemia of chronic disease   ESRD on hemodialysis ([place])   Uncontrolled type 2 diabetes mellitus with hyperglycemia (H[facility])   Hyponatremia  Severe right foot infection/gas gangrene right foot, present on admission -Status post open right foot amputation on 12/30 -Revision of right foot transmetatarsal amputation /placement of wound VAC on 1/4 Vascular recommended total of 6 weeks of abx treatment, patient is at risk of  BKA per vascular surgery -she Was on vanc and cipro,Wound culture +  MSSA, ID pharmacy discussed with vascular, now abx changed to  ancef - will coordinate with nephrology regarding ancef post HD, appear that vascular is now planning for right BKA next week, so abx treatment plan might change -Appreciate ID pharmacy, nephrology ,vascular surgery input  ESRD on HD, TTS, anemia of chronic disease Status post 2 PRBC transfusion on 12/31, hgb has been stable post transfusion Management per nephrology  Uncontrolled insulin-dependent diabetes, 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 Hardie Lora on last on 09/12/2020, she was prescribed dexcom at that time Has hypoglycemia and hyperglycemia event in the hospital, she has inconsistent oral intake  Hypertension Blood pressure stable on home medication Cozaar  Body mass index is 28.78 kg/m.Marland Kitchen      Unresulted Labs (From admission, onward)     Start     Ordered   08/19/21 1222  Renal function panel  Tomorrow morning,   R       Question:  Specimen collection method  Answer:  Lab=Lab collect   08/19/21 1221   08/19/21 1222  CBC  Tomorrow morning,   R       Question:  Specimen collection method  Answer:  Lab=Lab collect   08/19/21 1221              DVT prophylaxis: heparin injection 5,000 Units Start: 08/11/21 0600   Code Status: Full Family Communication: she give me permission to call her husband, will call Disposition:   Status is: Inpatient  Dispo: The patient is from: Home              Anticipated d/c is to: TBD, Possible home health vs snf pending Pt eval              Anticipated d/c date is: TBD, need vascular surgery clearance , she may end up with right BKA               Consultants:  Vascular surgery Nephrology  Procedures:  Dialysis Status post open right foot amputation on 12/30 -Revision of right foot transmetatarsal amputation /placement of wound VAC on 1/4  Antimicrobials:    Anti-infectives (From admission, onward)    Start     Dose/Rate Route Frequency Ordered Stop   08/17/21 2200  ciprofloxacin (CIPRO) tablet 500 mg  Status:  Discontinued        500 mg Oral Daily at bedtime 08/17/21 0928 08/17/21 1330   08/17/21 1800  ceFAZolin (ANCEF) IVPB 2g/100 mL premix        2 g 200 mL/hr over 30 Minutes Intravenous Every T-Th-Sa (1800) 08/17/21 1303 09/26/21 2359   08/12/21 1200  vancomycin (VANCOREADY) IVPB 750 mg/150 mL  Status:  Discontinued        750  mg 150 mL/hr over 60 Minutes Intravenous Every T-Th-Sa (Hemodialysis) 08/11/21 0238 08/17/21 1303   08/11/21 2200  ciprofloxacin (CIPRO) IVPB 400 mg  Status:  Discontinued       Note to Pharmacy: Cipro 400 mg IV q24h for Cr Cl < 30 mL/min   400 mg 200 mL/hr over 60 Minutes Intravenous Every 24 hours 08/11/21 0321 08/17/21 0928   08/11/21 1000  ciprofloxacin (CIPRO) IVPB 400 mg  Status:  Discontinued        400 mg 200 mL/hr over 60 Minutes Intravenous Every 12 hours 08/11/21 0316 08/11/21 0321   08/11/21 0600  clindamycin (CLEOCIN) IVPB 600 mg  Status:  Discontinued        600 mg 100 mL/hr over 30 Minutes Intravenous Every 8 hours 08/11/21 0316 08/14/21 0912   08/11/21 0100  vancomycin (VANCOREADY) IVPB 1500 mg/300 mL        1,500 mg 150 mL/hr over 120 Minutes Intravenous  Once 08/11/21 0014 08/11/21 0404   08/11/21 0015  ciprofloxacin (CIPRO) IVPB 400 mg        400 mg 200 mL/hr over 60 Minutes Intravenous  Once 08/11/21 0014 08/11/21 0202   08/11/21 0015  clindamycin (CLEOCIN) IVPB 600 mg        600 mg 100 mL/hr over 30 Minutes Intravenous  Once 08/11/21 0014 08/11/21 0057          Objective: Vitals:   08/18/21 1923 08/18/21 2352 08/19/21 0304 08/19/21 0900  BP: (!) 152/79 (!) 159/71 (!) 144/66 (!) 149/70  Pulse: 77 88 88 87  Resp: 17 16 15  (!) 36  Temp: 97.9 F (36.6 C) 99 F (37.2 C) 98.9 F (37.2 C) 99.7 F (37.6 C)  TempSrc: Oral Oral Oral Oral  SpO2: 100% 95% 97%   Weight:   69.1 kg   Height:        Intake/Output Summary (Last 24 hours) at 08/19/2021 1531 Last data filed at 08/19/2021 0630 Gross per 24 hour  Intake 360 ml  Output 0 ml  Net 360 ml   Filed Weights   08/17/21 0331 08/17/21 1402 08/19/21 0304  Weight: 67.9 kg 69 kg 69.1 kg    Examination:  General exam: she is aaox3 , she choose not to engage in conversation much today, right eye blind Respiratory system: .  Clear to auscultation bilaterally ,respiratory effort normal. Cardiovascular system:   RRR.  Gastrointestinal system: Abdomen is nondistended, soft and nontender.  Normal bowel sounds heard. Central nervous system: Alert and oriented. No focal neurological deficits. Extremities:  + Amputation right foot, wound vac removed, + dressing  Skin: No rashes, lesions or ulcers Psychiatry: Flat affect, no agitation.     Data Reviewed: I have personally reviewed  following labs and imaging studies  CBC: Recent Labs  Lab 08/13/21 0856 08/14/21 0445 08/16/21 0446  WBC 12.9* 14.6* 11.2*  NEUTROABS  --  11.3*  --   HGB 10.1* 9.7* 10.1*  HCT 31.2* 29.7* 31.3*  MCV 90.2 90.0 90.7  PLT 229 244 417    Basic Metabolic Panel: Recent Labs  Lab 08/14/21 0445 08/15/21 0546 08/16/21 0446 08/17/21 0901 08/18/21 0500  NA 131* 135 134* 134* 134*  K 3.6 4.2 3.9 4.0 4.3  CL 90* 95* 96* 99 97*  CO2 24 21* 24 23 25   GLUCOSE 316* 216* 287* 146* 247*  BUN 59* 68* 31* 42* 27*  CREATININE 11.60* 13.83* 8.23* 10.25* 8.17*  CALCIUM 9.3 9.4 9.3 9.7 9.3  PHOS 8.3* 9.3* 6.5* 8.2* 6.8*    GFR: Estimated Creatinine Clearance: 8.2 mL/min (A) (by C-G formula based on SCr of 8.17 mg/dL (H)).  Liver Function Tests: Recent Labs  Lab 08/14/21 0445 08/15/21 0546 08/16/21 0446 08/17/21 0901 08/18/21 0500  ALBUMIN 2.0* 2.0* 2.0* 1.9* 2.0*    CBG: Recent Labs  Lab 08/18/21 1732 08/18/21 1824 08/18/21 2040 08/19/21 0629 08/19/21 1018  GLUCAP 66* 144* 109* 53* 172*     Recent Results (from the past 240 hour(s))  Blood Culture (routine x 2)     Status: Abnormal   Collection Time: 08/10/21  9:56 PM   Specimen: BLOOD  Result Value Ref Range Status   Specimen Description BLOOD  Final   Special Requests   Final    BOTTLES DRAWN AEROBIC AND ANAEROBIC Blood Culture adequate volume   Culture  Setup Time   Final    GRAM POSITIVE COCCI AEROBIC BOTTLE ONLY CRITICAL RESULT CALLED TO, READ BACK BY AND VERIFIED WITH: PHARMD GREG ABBOTT 08/12/21@00 :32 BY TW    Culture (A)  Final     STAPHYLOCOCCUS EPIDERMIDIS THE SIGNIFICANCE OF ISOLATING THIS ORGANISM FROM A SINGLE SET OF BLOOD CULTURES WHEN MULTIPLE SETS ARE DRAWN IS UNCERTAIN. PLEASE NOTIFY THE MICROBIOLOGY DEPARTMENT WITHIN ONE WEEK IF SPECIATION AND SENSITIVITIES ARE REQUIRED. Performed at [facility], [place] [address], [place], [place] [address]    Report Status 08/14/2021 FINAL  Final  Blood Culture ID Panel (Reflexed)     Status: Abnormal   Collection Time: 08/10/21  9:56 PM  Result Value Ref Range Status   Enterococcus faecalis NOT DETECTED NOT DETECTED Final   Enterococcus Faecium NOT DETECTED NOT DETECTED Final   Listeria monocytogenes NOT DETECTED NOT DETECTED Final   Staphylococcus species DETECTED (A) NOT DETECTED Final    Comment: CRITICAL RESULT CALLED TO, READ BACK BY AND VERIFIED WITH: PHARMD GREG ABBOTT 08/12/21@00 :32 BY TW    Staphylococcus aureus (BCID) NOT DETECTED NOT DETECTED Final   Staphylococcus epidermidis DETECTED (A) NOT DETECTED Final    Comment: Methicillin (oxacillin) resistant coagulase negative staphylococcus. Possible blood culture contaminant (unless isolated from more than one blood culture draw or clinical case suggests pathogenicity). No antibiotic treatment is indicated for blood  culture contaminants. CRITICAL RESULT CALLED TO, READ BACK BY AND VERIFIED WITH: PHARMD GREG ABBOTT 08/12/21@00 :32 BY TW    Staphylococcus lugdunensis NOT DETECTED NOT DETECTED Final   Streptococcus species NOT DETECTED NOT DETECTED Final   Streptococcus agalactiae NOT DETECTED NOT DETECTED Final   Streptococcus pneumoniae NOT DETECTED NOT DETECTED Final   Streptococcus pyogenes NOT DETECTED NOT DETECTED Final   A.calcoaceticus-baumannii NOT DETECTED NOT DETECTED Final   Bacteroides fragilis NOT DETECTED NOT DETECTED Final   Enterobacterales NOT DETECTED NOT DETECTED  Final   Enterobacter cloacae complex NOT DETECTED NOT DETECTED Final   Escherichia coli NOT DETECTED NOT DETECTED Final    Klebsiella aerogenes NOT DETECTED NOT DETECTED Final   Klebsiella oxytoca NOT DETECTED NOT DETECTED Final   Klebsiella pneumoniae NOT DETECTED NOT DETECTED Final   Proteus species NOT DETECTED NOT DETECTED Final   Salmonella species NOT DETECTED NOT DETECTED Final   Serratia marcescens NOT DETECTED NOT DETECTED Final   Haemophilus influenzae NOT DETECTED NOT DETECTED Final   Neisseria meningitidis NOT DETECTED NOT DETECTED Final   Pseudomonas aeruginosa NOT DETECTED NOT DETECTED Final   Stenotrophomonas maltophilia NOT DETECTED NOT DETECTED Final   Candida albicans NOT DETECTED NOT DETECTED Final   Candida auris NOT DETECTED NOT DETECTED Final   Candida glabrata NOT DETECTED NOT DETECTED Final   Candida krusei NOT DETECTED NOT DETECTED Final   Candida parapsilosis NOT DETECTED NOT DETECTED Final   Candida tropicalis NOT DETECTED NOT DETECTED Final   Cryptococcus neoformans/gattii NOT DETECTED NOT DETECTED Final   Methicillin resistance mecA/C DETECTED (A) NOT DETECTED Final    Comment: CRITICAL RESULT CALLED TO, READ BACK BY AND VERIFIED WITH: PHARMD GREG ABBOTT 08/12/21@00 :32 BY TW Performed at [facility], [address] [place], [place] [address]   Blood Culture (routine x 2)     Status: None   Collection Time: 08/10/21 10:12 PM   Specimen: BLOOD LEFT HAND  Result Value Ref Range Status   Specimen Description BLOOD LEFT HAND  Final   Special Requests   Final    BOTTLES DRAWN AEROBIC AND ANAEROBIC Blood Culture adequate volume   Culture   Final    NO GROWTH 5 DAYS Performed at [facility], [place] [address] [place], [place] [address]    Report Status 08/15/2021 FINAL  Final  Resp Panel by RT-PCR (Flu A&B, Covid) Nasopharyngeal Swab     Status: None   Collection Time: 08/10/21 11:19 PM   Specimen: Nasopharyngeal Swab; Nasopharyngeal(NP) swabs in vial transport medium  Result Value Ref Range Status   SARS Coronavirus 2 by RT PCR NEGATIVE NEGATIVE Final    Comment:  (NOTE) SARS-CoV-2 target nucleic acids are NOT DETECTED.  The SARS-CoV-2 RNA is generally detectable in upper respiratory specimens during the acute phase of infection. The lowest concentration of SARS-CoV-2 viral copies this assay can detect is 138 copies/mL. A negative result does not preclude SARS-Cov-2 infection and should not be used as the sole basis for treatment or other patient management decisions. A negative result may occur with  improper specimen collection/handling, submission of specimen other than nasopharyngeal swab, presence of viral mutation(s) within the areas targeted by this assay, and inadequate number of viral copies(<138 copies/mL). A negative result must be combined with clinical observations, patient history, and epidemiological information. The expected result is Negative.  Fact Sheet for Patients:  EntrepreneurPulse.com.au  Fact Sheet for Healthcare Providers:  IncredibleEmployment.be  This test is no t yet approved or cleared by the [place] FDA and  has been authorized for detection and/or diagnosis of SARS-CoV-2 by FDA under an Emergency Use Authorization (EUA). This EUA will remain  in effect (meaning this test can be used) for the duration of the COVID-19 declaration under Section 564(b)(1) of the Act, 21 U.S.C.section 360bbb-3(b)(1), unless the authorization is terminated  or revoked sooner.       Influenza A by PCR NEGATIVE NEGATIVE Final   Influenza B by PCR NEGATIVE NEGATIVE Final    Comment: (NOTE) The  Xpert Xpress SARS-CoV-2/FLU/RSV plus assay is intended as an aid in the diagnosis of influenza from Nasopharyngeal swab specimens and should not be used as a sole basis for treatment. Nasal washings and aspirates are unacceptable for Xpert Xpress SARS-CoV-2/FLU/RSV testing.  Fact Sheet for Patients: EntrepreneurPulse.com.au  Fact Sheet for Healthcare  Providers: IncredibleEmployment.be  This test is not yet approved or cleared by the [place] FDA and has been authorized for detection and/or diagnosis of SARS-CoV-2 by FDA under an Emergency Use Authorization (EUA). This EUA will remain in effect (meaning this test can be used) for the duration of the COVID-19 declaration under Section 564(b)(1) of the Act, 21 U.S.C. section 360bbb-3(b)(1), unless the authorization is terminated or revoked.  Performed at [facility], [place] [address] [place], [place] [address]   Aerobic Culture w Gram Stain (superficial specimen)     Status: None   Collection Time: 08/11/21  5:10 PM   Specimen: Wound  Result Value Ref Range Status   Specimen Description WOUND RIGHT TOE  Final   Special Requests RT BIG TOE  Final   Gram Stain   Final    FEW FEW GRAM POSITIVE COCCI FEW GRAM POSITIVE RODS RARE GRAM NEGATIVE RODS RARE GRAM NEGATIVE COCCOBACILLI Performed at [facility], [place] [address], [place], [place] [address]    Culture FEW STAPHYLOCOCCUS AUREUS  Final   Report Status 08/14/2021 FINAL  Final   Organism ID, Bacteria STAPHYLOCOCCUS AUREUS  Final      Susceptibility   Staphylococcus aureus - MIC*    CIPROFLOXACIN <=0.5 SENSITIVE Sensitive     ERYTHROMYCIN >=8 RESISTANT Resistant     GENTAMICIN <=0.5 SENSITIVE Sensitive     OXACILLIN 0.5 SENSITIVE Sensitive     TETRACYCLINE <=1 SENSITIVE Sensitive     VANCOMYCIN <=0.5 SENSITIVE Sensitive     TRIMETH/SULFA <=10 SENSITIVE Sensitive     CLINDAMYCIN RESISTANT Resistant     RIFAMPIN <=0.5 SENSITIVE Sensitive     Inducible Clindamycin POSITIVE Resistant     * FEW STAPHYLOCOCCUS AUREUS  Surgical pcr screen     Status: None   Collection Time: 08/16/21  4:38 AM   Specimen: Nasal Mucosa; Nasal Swab  Result Value Ref Range Status   MRSA, PCR NEGATIVE NEGATIVE Final   Staphylococcus aureus NEGATIVE NEGATIVE Final    Comment: (NOTE) The Xpert SA Assay (FDA approved  for NASAL specimens in patients 8 years of age and older), is one component of a comprehensive surveillance program. It is not intended to diagnose infection nor to guide or monitor treatment. Performed at [facility], [place] [address], [place], [place] [address]          Radiology Studies: No results found.      Scheduled Meds:  sodium chloride   Intravenous Once   Chlorhexidine Gluconate Cloth  6 each Topical Q0600   Chlorhexidine Gluconate Cloth  6 each Topical Q0600   cinacalcet  90 mg Oral Daily   [START ON 08/22/2021] darbepoetin (ARANESP) injection - DIALYSIS  150 mcg Intravenous Q Tue-HD   heparin  5,000 Units Subcutaneous Q8H   insulin aspart  0-6 Units Subcutaneous TID WC & HS   insulin glargine-yfgn  20 Units Subcutaneous QHS   LORazepam  1 mg Intravenous Once   losartan  50 mg Oral Daily   multivitamin  1 tablet Oral QHS   sevelamer carbonate  1.6 g Oral TID WC   sodium chloride flush  3 mL Intravenous Q12H  sodium chloride flush  3 mL Intravenous Q12H   Continuous Infusions:  sodium chloride     sodium chloride 500 mL (08/14/21 2116)    ceFAZolin (ANCEF) IV 2 g (08/17/21 1746)     LOS: 8 days   Time spent: 20mins Greater than 50% of this time was spent in counseling, explanation of diagnosis, planning of further management, and coordination of care.   Voice Recognition Viviann Spare dictation system was used to create this note, attempts have been made to correct errors. Please contact the author with questions and/or clarifications.   Florencia Reasons, MD PhD FACP [facility]  Available via Epic secure chat 7am-7pm for nonurgent issues Please page for urgent issues To page the attending provider between 7A-7P or the covering provider during after hours 7P-7A, please log into the web site [email or web address] and access using universal [place] password for that web site. If you do not have the password, please call the hospital  operator.    08/19/2021, 3:31 PM

## 2021-08-19 NOTE — Progress Notes (Addendum)
°  Progress Note    08/19/2021 9:28 AM 3 Days Post-Op  Subjective:  No complaints   Vitals:   08/18/21 2352 08/19/21 0304  BP: (!) 159/71 (!) 144/66  Pulse: 88 88  Resp: 16 15  Temp: 99 F (37.2 C) 98.9 F (37.2 C)  SpO2: 95% 97%   Physical Exam: Lungs:  non labored Incisions:  R TMA with minimal drainage; boggy to touch  Neurologic: A&O  CBC    Component Value Date/Time   WBC 11.2 (H) 08/16/2021 0446   RBC 3.45 (L) 08/16/2021 0446   HGB 10.1 (L) 08/16/2021 0446   HGB 10.6 (L) 05/18/2020 0927   HCT 31.3 (L) 08/16/2021 0446   HCT 35.1 05/18/2020 0927   PLT 216 08/16/2021 0446   PLT 186 05/18/2020 0927   MCV 90.7 08/16/2021 0446   MCV 89 05/18/2020 0927   MCH 29.3 08/16/2021 0446   MCHC 32.3 08/16/2021 0446   RDW 17.1 (H) 08/16/2021 0446   RDW 13.9 05/18/2020 0927   LYMPHSABS 1.6 08/14/2021 0445   LYMPHSABS 1.6 05/18/2020 0927   MONOABS 0.9 08/14/2021 0445   EOSABS 0.6 (H) 08/14/2021 0445   EOSABS 0.6 (H) 05/18/2020 0927   BASOSABS 0.0 08/14/2021 0445   BASOSABS 0.0 05/18/2020 0927    BMET    Component Value Date/Time   NA 134 (L) 08/18/2021 0500   NA 133 (L) 05/18/2020 0927   K 4.3 08/18/2021 0500   CL 97 (L) 08/18/2021 0500   CO2 25 08/18/2021 0500   GLUCOSE 247 (H) 08/18/2021 0500   BUN 27 (H) 08/18/2021 0500   BUN 45 (H) 05/18/2020 0927   CREATININE 8.17 (H) 08/18/2021 0500   CALCIUM 9.3 08/18/2021 0500   GFRNONAA 6 (L) 08/18/2021 0500   GFRAA 6 (L) 05/18/2020 0927    INR    Component Value Date/Time   INR 1.2 08/10/2021 2212     Intake/Output Summary (Last 24 hours) at 08/19/2021 0093 Last data filed at 08/19/2021 0630 Gross per 24 hour  Intake 360 ml  Output 0 ml  Net 360 ml     Assessment/Plan:  41 y.o. female is s/p R TMA 3 Days Post-Op   Dressing changed today; continue dry dressing changes daily TMA surgery sight is boggy to palpation suggesting necrotic tissue Patient will likely require a R BKA next week   Dagoberto Ligas,  PA-C [facility] [phone] 08/19/2021 9:28 AM  VASCULAR STAFF ADDENDUM: I have independently interviewed and examined the patient. I agree with the above.    Cassandria Santee, MD [facility] Phone Number: [phone] 08/19/2021 10:47 AM

## 2021-08-19 NOTE — Progress Notes (Signed)
OT Cancellation Note  Patient Details Name: [name] MRN: [id number] DOB: 16-Jun-1981   Cancelled Treatment:    Reason Eval/Treat Not Completed: Pain limiting ability to participate.  Attempted skilled OT treatment session.  Pt. With eyes closed for entire conversation.  Stating pain 9/10.  Rn notified of pts. Request for pain meds.  Declines all attempts at therapy even with encouragement that repositioning could aide in pain management.  Will check back as schedule permits.     Clearnce Sorrel Lorraine-COTA/L 08/19/2021, 9:14 AM

## 2021-08-19 NOTE — Progress Notes (Signed)
Patient  back from HD. Did not complete treatment per HD nurse. Vitals stable, safety measures implemented.

## 2021-08-20 DIAGNOSIS — I96 Gangrene, not elsewhere classified: Secondary | ICD-10-CM | POA: Diagnosis not present

## 2021-08-20 LAB — CBC
HCT: 30.1 % — ABNORMAL LOW (ref 36.0–46.0)
Hemoglobin: 9.4 g/dL — ABNORMAL LOW (ref 12.0–15.0)
MCH: 28.1 pg (ref 26.0–34.0)
MCHC: 31.2 g/dL (ref 30.0–36.0)
MCV: 90.1 fL (ref 80.0–100.0)
Platelets: 216 10*3/uL (ref 150–400)
RBC: 3.34 MIL/uL — ABNORMAL LOW (ref 3.87–5.11)
RDW: 15.9 % — ABNORMAL HIGH (ref 11.5–15.5)
WBC: 14.9 10*3/uL — ABNORMAL HIGH (ref 4.0–10.5)
nRBC: 0 % (ref 0.0–0.2)

## 2021-08-20 LAB — RENAL FUNCTION PANEL
Albumin: 2.1 g/dL — ABNORMAL LOW (ref 3.5–5.0)
Anion gap: 16 — ABNORMAL HIGH (ref 5–15)
BUN: 33 mg/dL — ABNORMAL HIGH (ref 6–20)
CO2: 22 mmol/L (ref 22–32)
Calcium: 9.3 mg/dL (ref 8.9–10.3)
Chloride: 95 mmol/L — ABNORMAL LOW (ref 98–111)
Creatinine, Ser: 8.81 mg/dL — ABNORMAL HIGH (ref 0.44–1.00)
GFR, Estimated: 5 mL/min — ABNORMAL LOW (ref >60)
Glucose, Bld: 111 mg/dL — ABNORMAL HIGH (ref 70–99)
Phosphorus: 7.6 mg/dL — ABNORMAL HIGH (ref 2.5–4.6)
Potassium: 3.9 mmol/L (ref 3.5–5.1)
Sodium: 133 mmol/L — ABNORMAL LOW (ref 135–145)

## 2021-08-20 LAB — GLUCOSE, CAPILLARY
Glucose-Capillary: 76 mg/dL (ref 70–99)
Glucose-Capillary: 127 mg/dL — ABNORMAL HIGH (ref 70–99)
Glucose-Capillary: 58 mg/dL — ABNORMAL LOW (ref 70–99)
Glucose-Capillary: 70 mg/dL (ref 70–99)
Glucose-Capillary: 142 mg/dL — ABNORMAL HIGH (ref 70–99)

## 2021-08-20 MED ORDER — INSULIN GLARGINE-YFGN 100 UNIT/ML ~~LOC~~ SOLN
18.0000 [IU] | Freq: Every day | SUBCUTANEOUS | Status: DC
  Filled 2021-08-20: qty 0.18

## 2021-08-20 NOTE — Progress Notes (Addendum)
[facility] Progress Note   Subjective:   Patient seen and examined at bedside.  Reports throbbing pain in L foot.  Denies CP, SOB, abdominal pain and n/v/d.   Reports she signed off early yesterday d/t cramping.    Objective Vitals:   08/20/21 0006 08/20/21 0348 08/20/21 0500 08/20/21 0742  BP: 116/67 140/79  (!) 146/69  Pulse: 81 89  91  Resp: 18 18  13   Temp: 98.5 F (36.9 C) 98.5 F (36.9 C)  98.4 F (36.9 C)  TempSrc: Oral Oral  Oral  SpO2: 100% 99%  99%  Weight:   65.4 kg   Height:       Physical Exam General:chronically ill appearing female in NAD Heart:RRR, no mrg Lungs:CTAB, nml WOB on RA Abdomen:soft, NTND Extremities:no LE edema, L TMA dressed Dialysis Access: RU AVF +b/t   Filed Weights   08/19/21 1645 08/19/21 2013 08/20/21 0500  Weight: 66.9 kg 64.7 kg 65.4 kg    Intake/Output Summary (Last 24 hours) at 08/20/2021 0947 Last data filed at 08/20/2021 0741 Gross per 24 hour  Intake 0 ml  Output 1394 ml  Net -1394 ml    Additional Objective Labs: Basic Metabolic Panel: Recent Labs  Lab 08/18/21 0500 08/19/21 1750 08/20/21 0845  NA 134* 133* 133*  K 4.3 4.2 3.9  CL 97* 96* 95*  CO2 25 22 22   GLUCOSE 247* 186* 111*  BUN 27* 47* 33*  CREATININE 8.17* 11.63* 8.81*  CALCIUM 9.3 8.5* 9.3  PHOS 6.8* 9.6* 7.6*   Liver Function Tests: Recent Labs  Lab 08/18/21 0500 08/19/21 1750 08/20/21 0845  ALBUMIN 2.0* 1.7* 2.1*   CBC: Recent Labs  Lab 08/14/21 0445 08/16/21 0446 08/19/21 1750 08/20/21 0845  WBC 14.6* 11.2* 12.6* 14.9*  NEUTROABS 11.3*  --   --   --   HGB 9.7* 10.1* 8.6* 9.4*  HCT 29.7* 31.3* 27.5* 30.1*  MCV 90.0 90.7 91.1 90.1  PLT 244 216 210 216   CBG: Recent Labs  Lab 08/19/21 0629 08/19/21 1018 08/19/21 1624 08/19/21 2104 08/20/21 0616  GLUCAP 53* 172* 183* 144* 76   Medications:  sodium chloride     sodium chloride 500 mL (08/14/21 2116)    ceFAZolin (ANCEF) IV 2 g (08/19/21 2118)    sodium  chloride   Intravenous Once   Chlorhexidine Gluconate Cloth  6 each Topical Q0600   Chlorhexidine Gluconate Cloth  6 each Topical Q0600   cinacalcet  90 mg Oral Daily   [START ON 08/22/2021] darbepoetin (ARANESP) injection - DIALYSIS  150 mcg Intravenous Q Tue-HD   heparin  5,000 Units Subcutaneous Q8H   insulin aspart  0-6 Units Subcutaneous TID WC & HS   insulin glargine-yfgn  20 Units Subcutaneous QHS   LORazepam  1 mg Intravenous Once   losartan  50 mg Oral Daily   multivitamin  1 tablet Oral QHS   sevelamer carbonate  1.6 g Oral TID WC   sodium chloride flush  3 mL Intravenous Q12H   sodium chloride flush  3 mL Intravenous Q12H    Dialysis Orders: [facility]   3h 88min  400/1.5  68.2kg  2/2 bath  15g  Hep 4000  AVF  - mircera 262mcg IV q 2 weeks- last dose 12/27  - hectorol 9 mcg IV q HD   - sensipar 90mg  2 tabs po QHS   - home meds > amlodipine 10mg  QD, renvela 2.4g 1 ac tid   Assessment/ Plan: R great  toe diabetic infection: s/p angiogram and 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  IV antibiotics per primary team.  Currently on Ancef 2g IV qHD x 6 weeks with end date 2/14.  Per [facility] notes will likely need R BKA next week as TMA is not healing.  ESRD:  HD TTS.  Next HD on 08/22/21  Hypertension/volume: BP mildly elevated this AM.  On losartan 50mg  qd.  Does not appear volume overloaded, UF as tolerated. Getting under dry wt, will need to be lowered in d/c.   Anemia CKD and blood loss: outpatient HD notes reveal pt had noticed red blood in her stools and had been referred to GI outpatient. Hgb 6.6 on admit, last improved to 9.4 s/p 2 units PRBC's on 12/31 Plan for Aranesp 150 mcg on 08/22/21 (when ESA next due)   Metabolic bone disease: Hyperphosphatemia improving.  On renvela packets - increased dose to 1.6 gram TID. Hypercalcemia when corrected for albumin - 10.9 corrected as of 1/6.  We stopped high dose hectorol.   We restarted sensipar here. Use 2.0 Ca bath.  T2DM: hyperglycemic on admit, had not been  taking her insulin at home. Management per primary team. Nutrition - Renal/carb modified diet w/fluid restrictions.   Jen Mow, PA-C [facility] 08/20/2021,9:47 AM  LOS: 9 days    Seen and examined independently.  Agree with note and exam as documented above by physician extender and as noted here.  More awake than I have seen her previously.  On the phone with her husband.   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 On Ancef 2g IV qHD x 6 weeks with end date 2/14.  We can give this with outpatient HD   ESRD - HD per TTS schedule   Metabolic bone disease - phos a little better with binder dose increase    Anemia CKD - improved; aranesp is ordered for 1/10; s/p dose late December outpatient as above   T2DM - per primary team   Claudia Desanctis, MD 08/20/2021  1:38 PM

## 2021-08-20 NOTE — Progress Notes (Signed)
Hypoglycemic Event  CBG: 58  Treatment: 4 oz juice/soda  Symptoms: Shaky  Follow-up CBG: Time:1800 CBG Result:70  Possible Reasons for Event: Inadequate meal intake  Clyde Canterbury, RN

## 2021-08-20 NOTE — Progress Notes (Signed)
PROGRESS NOTE    [name]  IOE:[id number] DOB: 05-Jun-1981 DOA: 08/10/2021 PCP: [facility]    Chief Complaint  Patient presents with   Gen. Weakness / SOB     Missed Dialysis / Hyperglycemia    Brief Narrative:  41 year old with history of type 2 diabetes on insulin, ESRD on hemodialysis TTS schedule, neuropathy, hypertension with poor compliance came to the emergency room with right foot pain and infection.  She was found to have gangrenous toes.  Underwent angiogram and urgent transmetatarsal amputation  Subjective:  No acute event overnight , vital signs are stable  She reports feeling better today Blood glucose still fluctuating     Assessment & Plan:   Principal Problem:   Gangrene of right foot (HCC) Active Problems:   Anemia of chronic disease   ESRD on hemodialysis ([place])   Uncontrolled type 2 diabetes mellitus with hyperglycemia (HCC)   Hyponatremia  Severe right foot infection/gas gangrene right foot, present on admission -Status post open right foot amputation on 12/30 -Revision of right foot transmetatarsal amputation /placement of wound VAC on 1/4 -wound vac removed on 1/6 -Wound culture +  MSSA, ID pharmacy discussed with vascular, now abx changed to  ancef - will coordinate with nephrology regarding ancef post HD, appear that vascular is now planning for right BKA next week, so abx treatment plan might change -Appreciate ID pharmacy, nephrology ,vascular surgery input  ESRD on HD, TTS, anemia of chronic disease Status post 2 PRBC transfusion on 12/31, hgb has been stable post transfusion Management per nephrology  Uncontrolled insulin-dependent diabetes, 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 Hardie Lora on last on 09/12/2020, she was prescribed dexcom at that time   hypoglycemia and hyperglycemia event in the hospital, she has inconsistent oral intake  Hypertension Blood pressure stable on home medication Cozaar  Body mass index is 27.24 kg/m.Marland Kitchen      Unresulted Labs (From admission, onward)    None         DVT prophylaxis: heparin injection 5,000 Units Start: 08/11/21 0600   Code Status: Full Family Communication: she give me permission to call her husband, will call Disposition:   Status is: Inpatient  Dispo: The patient is from: Home              Anticipated d/c is to: TBD, Possible home health vs snf pending Pt eval              Anticipated d/c date is: TBD, need vascular surgery clearance , she may end up with right BKA               Consultants:  Vascular surgery Nephrology  Procedures:  Dialysis Status post open right foot amputation on 12/30 -Revision of right foot transmetatarsal amputation /placement of wound VAC on 1/4  Antimicrobials:    Anti-infectives (From admission, onward)    Start     Dose/Rate Route Frequency Ordered Stop   08/17/21 2200  ciprofloxacin (CIPRO) tablet 500 mg  Status:  Discontinued        500 mg Oral Daily at bedtime 08/17/21 0928 08/17/21 1330   08/17/21 1800  ceFAZolin (ANCEF) IVPB 2g/100 mL premix  2 g 200 mL/hr over 30 Minutes Intravenous Every T-Th-Sa (1800) 08/17/21 1303 09/26/21 2359   08/12/21 1200  vancomycin (VANCOREADY) IVPB 750 mg/150 mL  Status:  Discontinued        750 mg 150 mL/hr over 60 Minutes Intravenous Every T-Th-Sa (Hemodialysis) 08/11/21 0238 08/17/21 1303   08/11/21 2200  ciprofloxacin (CIPRO) IVPB 400 mg  Status:  Discontinued       Note to Pharmacy: Cipro 400 mg IV q24h for Cr Cl < 30 mL/min   400 mg 200 mL/hr over 60 Minutes Intravenous Every 24 hours 08/11/21 0321 08/17/21 0928   08/11/21 1000  ciprofloxacin (CIPRO) IVPB 400 mg  Status:   Discontinued        400 mg 200 mL/hr over 60 Minutes Intravenous Every 12 hours 08/11/21 0316 08/11/21 0321   08/11/21 0600  clindamycin (CLEOCIN) IVPB 600 mg  Status:  Discontinued        600 mg 100 mL/hr over 30 Minutes Intravenous Every 8 hours 08/11/21 0316 08/14/21 0912   08/11/21 0100  vancomycin (VANCOREADY) IVPB 1500 mg/300 mL        1,500 mg 150 mL/hr over 120 Minutes Intravenous  Once 08/11/21 0014 08/11/21 0404   08/11/21 0015  ciprofloxacin (CIPRO) IVPB 400 mg        400 mg 200 mL/hr over 60 Minutes Intravenous  Once 08/11/21 0014 08/11/21 0202   08/11/21 0015  clindamycin (CLEOCIN) IVPB 600 mg        600 mg 100 mL/hr over 30 Minutes Intravenous  Once 08/11/21 0014 08/11/21 0057          Objective: Vitals:   08/20/21 0742 08/20/21 1104 08/20/21 1522 08/20/21 1939  BP: (!) 146/69 122/64 138/68 123/67  Pulse: 91 82 92 81  Resp: 13 12 15 11   Temp: 98.4 F (36.9 C) 98.7 F (37.1 C) 98.2 F (36.8 C) 98.2 F (36.8 C)  TempSrc: Oral Oral Oral Oral  SpO2: 99% 99% 98% 97%  Weight:      Height:        Intake/Output Summary (Last 24 hours) at 08/20/2021 1948 Last data filed at 08/20/2021 1500 Gross per 24 hour  Intake 820 ml  Output 1394 ml  Net -574 ml   Filed Weights   08/19/21 1645 08/19/21 2013 08/20/21 0500  Weight: 66.9 kg 64.7 kg 65.4 kg    Examination:  General exam: she is aaox3 ,  right eye blind Respiratory system: .  Clear to auscultation bilaterally ,respiratory effort normal. Cardiovascular system:  RRR.  Gastrointestinal system: Abdomen is nondistended, soft and nontender.  Normal bowel sounds heard. Central nervous system: Alert and oriented. No focal neurological deficits. Extremities:  + Amputation right foot, + dressing  Skin: No rashes, lesions or ulcers Psychiatry: Flat affect, no agitation.     Data Reviewed: I have personally reviewed following labs and imaging studies  CBC: Recent Labs  Lab 08/14/21 0445 08/16/21 0446  08/19/21 1750 08/20/21 0845  WBC 14.6* 11.2* 12.6* 14.9*  NEUTROABS 11.3*  --   --   --   HGB 9.7* 10.1* 8.6* 9.4*  HCT 29.7* 31.3* 27.5* 30.1*  MCV 90.0 90.7 91.1 90.1  PLT 244 216 210 841    Basic Metabolic Panel: Recent Labs  Lab 08/16/21 0446 08/17/21 0901 08/18/21 0500 08/19/21 1750 08/20/21 0845  NA 134* 134* 134* 133* 133*  K 3.9 4.0 4.3 4.2 3.9  CL 96* 99 97* 96* 95*  CO2 24 23 25 22  22  GLUCOSE 287* 146* 247* 186* 111*  BUN 31* 42* 27* 47* 33*  CREATININE 8.23* 10.25* 8.17* 11.63* 8.81*  CALCIUM 9.3 9.7 9.3 8.5* 9.3  PHOS 6.5* 8.2* 6.8* 9.6* 7.6*    GFR: Estimated Creatinine Clearance: 7.4 mL/min (A) (by C-G formula based on SCr of 8.81 mg/dL (H)).  Liver Function Tests: Recent Labs  Lab 08/16/21 0446 08/17/21 0901 08/18/21 0500 08/19/21 1750 08/20/21 0845  ALBUMIN 2.0* 1.9* 2.0* 1.7* 2.1*    CBG: Recent Labs  Lab 08/19/21 2104 08/20/21 0616 08/20/21 1107 08/20/21 1727 08/20/21 1804  GLUCAP 144* 76 127* 58* 70     Recent Results (from the past 240 hour(s))  Blood Culture (routine x 2)     Status: Abnormal   Collection Time: 08/10/21  9:56 PM   Specimen: BLOOD  Result Value Ref [place] Status   Specimen Description BLOOD  Final   Special Requests   Final    BOTTLES DRAWN AEROBIC AND ANAEROBIC Blood Culture adequate volume   Culture  Setup Time   Final    GRAM POSITIVE COCCI AEROBIC BOTTLE ONLY CRITICAL RESULT CALLED TO, READ BACK BY AND VERIFIED WITH: PHARMD GREG ABBOTT 08/12/21@00 :32 BY TW    Culture (A)  Final    STAPHYLOCOCCUS EPIDERMIDIS THE SIGNIFICANCE OF ISOLATING THIS ORGANISM FROM A SINGLE SET OF BLOOD CULTURES WHEN MULTIPLE SETS ARE DRAWN IS UNCERTAIN. PLEASE NOTIFY THE MICROBIOLOGY DEPARTMENT WITHIN ONE WEEK IF SPECIATION AND SENSITIVITIES ARE REQUIRED. Performed at [facility], [place] [address], [place], [place] [address]    Report Status 08/14/2021 FINAL  Final  Blood Culture ID Panel (Reflexed)     Status: Abnormal    Collection Time: 08/10/21  9:56 PM  Result Value Ref [place] Status   Enterococcus faecalis NOT DETECTED NOT DETECTED Final   Enterococcus Faecium NOT DETECTED NOT DETECTED Final   Listeria monocytogenes NOT DETECTED NOT DETECTED Final   Staphylococcus species DETECTED (A) NOT DETECTED Final    Comment: CRITICAL RESULT CALLED TO, READ BACK BY AND VERIFIED WITH: PHARMD GREG ABBOTT 08/12/21@00 :32 BY TW    Staphylococcus aureus (BCID) NOT DETECTED NOT DETECTED Final   Staphylococcus epidermidis DETECTED (A) NOT DETECTED Final    Comment: Methicillin (oxacillin) resistant coagulase negative staphylococcus. Possible blood culture contaminant (unless isolated from more than one blood culture draw or clinical case suggests pathogenicity). No antibiotic treatment is indicated for blood  culture contaminants. CRITICAL RESULT CALLED TO, READ BACK BY AND VERIFIED WITH: PHARMD GREG ABBOTT 08/12/21@00 :32 BY TW    Staphylococcus lugdunensis NOT DETECTED NOT DETECTED Final   Streptococcus species NOT DETECTED NOT DETECTED Final   Streptococcus agalactiae NOT DETECTED NOT DETECTED Final   Streptococcus pneumoniae NOT DETECTED NOT DETECTED Final   Streptococcus pyogenes NOT DETECTED NOT DETECTED Final   A.calcoaceticus-baumannii NOT DETECTED NOT DETECTED Final   Bacteroides fragilis NOT DETECTED NOT DETECTED Final   Enterobacterales NOT DETECTED NOT DETECTED Final   Enterobacter cloacae complex NOT DETECTED NOT DETECTED Final   Escherichia coli NOT DETECTED NOT DETECTED Final   Klebsiella aerogenes NOT DETECTED NOT DETECTED Final   Klebsiella oxytoca NOT DETECTED NOT DETECTED Final   Klebsiella pneumoniae NOT DETECTED NOT DETECTED Final   Proteus species NOT DETECTED NOT DETECTED Final   Salmonella species NOT DETECTED NOT DETECTED Final   Serratia marcescens NOT DETECTED NOT DETECTED Final   Haemophilus influenzae NOT DETECTED NOT DETECTED Final   Neisseria meningitidis NOT DETECTED NOT DETECTED  Final   Pseudomonas aeruginosa NOT DETECTED NOT  DETECTED Final   Stenotrophomonas maltophilia NOT DETECTED NOT DETECTED Final   Candida albicans NOT DETECTED NOT DETECTED Final   Candida auris NOT DETECTED NOT DETECTED Final   Candida glabrata NOT DETECTED NOT DETECTED Final   Candida krusei NOT DETECTED NOT DETECTED Final   Candida parapsilosis NOT DETECTED NOT DETECTED Final   Candida tropicalis NOT DETECTED NOT DETECTED Final   Cryptococcus neoformans/gattii NOT DETECTED NOT DETECTED Final   Methicillin resistance mecA/C DETECTED (A) NOT DETECTED Final    Comment: CRITICAL RESULT CALLED TO, READ BACK BY AND VERIFIED WITH: PHARMD GREG ABBOTT 08/12/21@00 :32 BY TW Performed at [place] [address] [place], [place] [address]   Blood Culture (routine x 2)     Status: None   Collection Time: 08/10/21 10:12 PM   Specimen: BLOOD LEFT HAND  Result Value Ref [place] Status   Specimen Description BLOOD LEFT HAND  Final   Special Requests   Final    BOTTLES DRAWN AEROBIC AND ANAEROBIC Blood Culture adequate volume   Culture   Final    NO GROWTH 5 DAYS Performed at [facility], [place] [address] [place], [place] [address]    Report Status 08/15/2021 FINAL  Final  Resp Panel by RT-PCR (Flu A&B, Covid) Nasopharyngeal Swab     Status: None   Collection Time: 08/10/21 11:19 PM   Specimen: Nasopharyngeal Swab; Nasopharyngeal(NP) swabs in vial transport medium  Result Value Ref [place] Status   SARS Coronavirus 2 by RT PCR NEGATIVE NEGATIVE Final    Comment: (NOTE) SARS-CoV-2 target nucleic acids are NOT DETECTED.  The SARS-CoV-2 RNA is generally detectable in upper respiratory specimens during the acute phase of infection. The lowest concentration of SARS-CoV-2 viral copies this assay can detect is 138 copies/mL. A negative result does not preclude SARS-Cov-2 infection and should not be used as the sole basis for treatment or other patient management decisions. A negative  result may occur with  improper specimen collection/handling, submission of specimen other than nasopharyngeal swab, presence of viral mutation(s) within the areas targeted by this assay, and inadequate number of viral copies(<138 copies/mL). A negative result must be combined with clinical observations, patient history, and epidemiological information. The expected result is Negative.  Fact Sheet for Patients:  EntrepreneurPulse.com.au  Fact Sheet for Healthcare Providers:  IncredibleEmployment.be  This test is no t yet approved or cleared by the [place] FDA and  has been authorized for detection and/or diagnosis of SARS-CoV-2 by FDA under an Emergency Use Authorization (EUA). This EUA will remain  in effect (meaning this test can be used) for the duration of the COVID-19 declaration under Section 564(b)(1) of the Act, 21 U.S.C.section 360bbb-3(b)(1), unless the authorization is terminated  or revoked sooner.       Influenza A by PCR NEGATIVE NEGATIVE Final   Influenza B by PCR NEGATIVE NEGATIVE Final    Comment: (NOTE) The Xpert Xpress SARS-CoV-2/FLU/RSV plus assay is intended as an aid in the diagnosis of influenza from Nasopharyngeal swab specimens and should not be used as a sole basis for treatment. Nasal washings and aspirates are unacceptable for Xpert Xpress SARS-CoV-2/FLU/RSV testing.  Fact Sheet for Patients: EntrepreneurPulse.com.au  Fact Sheet for Healthcare Providers: IncredibleEmployment.be  This test is not yet approved or cleared by the [place] FDA and has been authorized for detection and/or diagnosis of SARS-CoV-2 by FDA under an Emergency Use Authorization (EUA). This EUA will remain in effect (meaning this test can be used) for the  duration of the COVID-19 declaration under Section 564(b)(1) of the Act, 21 U.S.C. section 360bbb-3(b)(1), unless the authorization is  terminated or revoked.  Performed at [facility], [place] [address] [place], [place] [address]   Aerobic Culture w Gram Stain (superficial specimen)     Status: None   Collection Time: 08/11/21  5:10 PM   Specimen: Wound  Result Value Ref [place] Status   Specimen Description WOUND RIGHT TOE  Final   Special Requests RT BIG TOE  Final   Gram Stain   Final    FEW FEW GRAM POSITIVE COCCI FEW GRAM POSITIVE RODS RARE GRAM NEGATIVE RODS RARE GRAM NEGATIVE COCCOBACILLI Performed at [facility], [place] [address], [place], [place] [address]    Culture FEW STAPHYLOCOCCUS AUREUS  Final   Report Status 08/14/2021 FINAL  Final   Organism ID, Bacteria STAPHYLOCOCCUS AUREUS  Final      Susceptibility   Staphylococcus aureus - MIC*    CIPROFLOXACIN <=0.5 SENSITIVE Sensitive     ERYTHROMYCIN >=8 RESISTANT Resistant     GENTAMICIN <=0.5 SENSITIVE Sensitive     OXACILLIN 0.5 SENSITIVE Sensitive     TETRACYCLINE <=1 SENSITIVE Sensitive     VANCOMYCIN <=0.5 SENSITIVE Sensitive     TRIMETH/SULFA <=10 SENSITIVE Sensitive     CLINDAMYCIN RESISTANT Resistant     RIFAMPIN <=0.5 SENSITIVE Sensitive     Inducible Clindamycin POSITIVE Resistant     * FEW STAPHYLOCOCCUS AUREUS  Surgical pcr screen     Status: None   Collection Time: 08/16/21  4:38 AM   Specimen: Nasal Mucosa; Nasal Swab  Result Value Ref [place] Status   MRSA, PCR NEGATIVE NEGATIVE Final   Staphylococcus aureus NEGATIVE NEGATIVE Final    Comment: (NOTE) The Xpert SA Assay (FDA approved for NASAL specimens in patients 62 years of age and older), is one component of a comprehensive surveillance program. It is not intended to diagnose infection nor to guide or monitor treatment. Performed at [facility], [place] [address], [place],  [address]          Radiology Studies: No results found.      Scheduled Meds:  sodium chloride   Intravenous Once   Chlorhexidine Gluconate Cloth  6 each Topical Q0600    Chlorhexidine Gluconate Cloth  6 each Topical Q0600   cinacalcet  90 mg Oral Daily   [START ON 08/22/2021] darbepoetin (ARANESP) injection - DIALYSIS  150 mcg Intravenous Q Tue-HD   heparin  5,000 Units Subcutaneous Q8H   insulin aspart  0-6 Units Subcutaneous TID WC & HS   insulin glargine-yfgn  20 Units Subcutaneous QHS   LORazepam  1 mg Intravenous Once   losartan  50 mg Oral Daily   multivitamin  1 tablet Oral QHS   sevelamer carbonate  1.6 g Oral TID WC   sodium chloride flush  3 mL Intravenous Q12H   sodium chloride flush  3 mL Intravenous Q12H   Continuous Infusions:  sodium chloride     sodium chloride 500 mL (08/14/21 2116)    ceFAZolin (ANCEF) IV 2 g (08/19/21 2118)     LOS: 9 days   Time spent: 24mins Greater than 50% of this time was spent in counseling, explanation of diagnosis, planning of further management, and coordination of care.   Voice Recognition Viviann Spare dictation system was used to create this note, attempts have been made to correct errors. Please contact the author with questions and/or clarifications.   Annamaria Boots  Erlinda Hong, MD PhD FACP [facility]  Available via Epic secure chat 7am-7pm for nonurgent issues Please page for urgent issues To page the attending provider between 7A-7P or the covering provider during after hours 7P-7A, please log into the web site [email or web address] and access using universal [place] password for that web site. If you do not have the password, please call the hospital operator.    08/20/2021, 7:48 PM

## 2021-08-21 ENCOUNTER — Ambulatory Visit: Payer: Medicaid Other | Admitting: Podiatry

## 2021-08-21 DIAGNOSIS — I96 Gangrene, not elsewhere classified: Secondary | ICD-10-CM | POA: Diagnosis not present

## 2021-08-21 LAB — GLUCOSE, CAPILLARY
Glucose-Capillary: 54 mg/dL — ABNORMAL LOW (ref 70–99)
Glucose-Capillary: 122 mg/dL — ABNORMAL HIGH (ref 70–99)
Glucose-Capillary: 183 mg/dL — ABNORMAL HIGH (ref 70–99)
Glucose-Capillary: 113 mg/dL — ABNORMAL HIGH (ref 70–99)
Glucose-Capillary: 69 mg/dL — ABNORMAL LOW (ref 70–99)
Glucose-Capillary: 90 mg/dL (ref 70–99)

## 2021-08-21 MED ORDER — INSULIN GLARGINE-YFGN 100 UNIT/ML ~~LOC~~ SOLN
15.0000 [IU] | Freq: Every day | SUBCUTANEOUS | Status: DC
  Administered 2021-08-21 – 2021-08-22 (×2): 15 [IU] via SUBCUTANEOUS
  Filled 2021-08-21 (×3): qty 0.15

## 2021-08-21 MED ORDER — BOOST / RESOURCE BREEZE PO LIQD CUSTOM
1.0000 | Freq: Two times a day (BID) | ORAL | Status: DC
  Administered 2021-08-21 – 2021-08-25 (×6): 1 via ORAL

## 2021-08-21 NOTE — Care Management Important Message (Signed)
Important Message  Patient Details  Name: [name] MRN: [id number] Date of Birth: 1981-03-20   Medicare Important Message Given:  Yes     Shelda Altes 08/21/2021, 10:22 AM

## 2021-08-21 NOTE — Progress Notes (Signed)
PROGRESS NOTE    [name]  HEN:[id number] DOB: [birth year] DOA: 08/10/2021 PCP: [facility], [facility]   Brief Narrative: 41 year old with past medical history significant for diabetes type 2 on insulin, ESRD on hemodialysis TTS schedule, neuropathy, hypertension, poor compliance presented to the ED with right foot pain and infection.  She was found to have gangrenous toes.  Underwent angiogram and urgent transmetatarsal amputation.   Assessment & Plan:   Principal Problem:   Gangrene of right foot (HCC) Active Problems:   Anemia of chronic disease   ESRD on hemodialysis ([place])   Uncontrolled type 2 diabetes mellitus with hyperglycemia (HCC)   Hyponatremia  1-Severe right foot infection/gas gangrene right foot: Present on admission -Status post open right foot with amputation on 12/30 -Revision of the right foot transmetatarsal amputation/placement of wound VAC 1/4.  Currently wound VAC removed 1/6 -Wound culture positive for MSSA, antibiotic changed to Ancef.  Patient might need Ancef with hemodialysis -Vascular is considering right BKA depending on wound improvement.   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  Hyperglycemia and hypoglycemia -CBG fluctuates -Still having low blood sugar, plan to decrease Semglee to 15 units -She has not required a sliding scale insulin -She will need to follow-up with Dr. Hardie Lora, endocrinologist   Hypertension: Continue with Cozaar  Hypoalbuminemia; start breeze.   Estimated body mass index is 26.99 kg/m as calculated from the following:   Height as of this encounter: 5\' 1"  (1.549 m).   Weight as of this encounter: 64.8 kg.   DVT prophylaxis: Heparin Code Status: Full code Family Communication: Care  discussed with patient Disposition Plan:  Status is: Inpatient  Remains inpatient appropriate because: Patient admitted with right foot infection gangrenous, vascular following, patient might require further surgery and amputation        Consultants:  Nephrology Vascular surgery  Procedures:  Status post open right foot amputation on 12/30 -Revision of right foot transmetatarsal amputation /placement of wound VAC on 1/4  Antimicrobials:  IV Ancef  Subjective: She is alert, she reported that she has been eating, but her breakfast tray is intact She denies pain  Objective: Vitals:   08/20/21 1522 08/20/21 1939 08/20/21 2314 08/21/21 0353  BP: 138/68 123/67 139/67 (!) 149/73  Pulse: 92 81 80   Resp: 15 11 12 17   Temp: 98.2 F (36.8 C) 98.2 F (36.8 C) 98.3 F (36.8 C) 98.4 F (36.9 C)  TempSrc: Oral Oral Oral Oral  SpO2: 98% 97% 95% 96%  Weight:    64.8 kg  Height:        Intake/Output Summary (Last 24 hours) at 08/21/2021 0802 Last data filed at 08/20/2021 2106 Gross per 24 hour  Intake 823 ml  Output --  Net 823 ml   Filed Weights   08/19/21 2013 08/20/21 0500 08/21/21 0353  Weight: 64.7 kg 65.4 kg 64.8 kg    Examination:  General exam: Appears calm and comfortable  Respiratory system: Clear to auscultation. Respiratory effort normal. Cardiovascular system: S1 & S2 heard, RRR. Gastrointestinal system: Abdomen is nondistended, soft and nontender. No organomegaly or masses felt. Normal bowel sounds heard. Central nervous system: Alert and oriented. No focal neurological deficits. Extremities: Right foot status post transmetatarsal amputation with clean dressing   Data Reviewed: I have personally reviewed following labs and  imaging studies  CBC: Recent Labs  Lab 08/16/21 0446 08/19/21 1750 08/20/21 0845  WBC 11.2* 12.6* 14.9*  HGB 10.1* 8.6* 9.4*  HCT 31.3* 27.5* 30.1*  MCV 90.7 91.1 90.1  PLT 216 210 357   Basic Metabolic Panel: Recent Labs   Lab 08/16/21 0446 08/17/21 0901 08/18/21 0500 08/19/21 1750 08/20/21 0845  NA 134* 134* 134* 133* 133*  K 3.9 4.0 4.3 4.2 3.9  CL 96* 99 97* 96* 95*  CO2 24 23 25 22 22   GLUCOSE 287* 146* 247* 186* 111*  BUN 31* 42* 27* 47* 33*  CREATININE 8.23* 10.25* 8.17* 11.63* 8.81*  CALCIUM 9.3 9.7 9.3 8.5* 9.3  PHOS 6.5* 8.2* 6.8* 9.6* 7.6*   GFR: Estimated Creatinine Clearance: 7.4 mL/min (A) (by C-G formula based on SCr of 8.81 mg/dL (H)). Liver Function Tests: Recent Labs  Lab 08/16/21 0446 08/17/21 0901 08/18/21 0500 08/19/21 1750 08/20/21 0845  ALBUMIN 2.0* 1.9* 2.0* 1.7* 2.1*   No results for input(s): LIPASE, AMYLASE in the last 168 hours. No results for input(s): AMMONIA in the last 168 hours. Coagulation Profile: No results for input(s): INR, PROTIME in the last 168 hours. Cardiac Enzymes: No results for input(s): CKTOTAL, CKMB, CKMBINDEX, TROPONINI in the last 168 hours. BNP (last 3 results) No results for input(s): PROBNP in the last 8760 hours. HbA1C: No results for input(s): HGBA1C in the last 72 hours. CBG: Recent Labs  Lab 08/20/21 1727 08/20/21 1804 08/20/21 2102 08/21/21 0403 08/21/21 0506  GLUCAP 58* 70 142* 54* 122*   Lipid Profile: No results for input(s): CHOL, HDL, LDLCALC, TRIG, CHOLHDL, LDLDIRECT in the last 72 hours. Thyroid Function Tests: No results for input(s): TSH, T4TOTAL, FREET4, T3FREE, THYROIDAB in the last 72 hours. Anemia Panel: No results for input(s): VITAMINB12, FOLATE, FERRITIN, TIBC, IRON, RETICCTPCT in the last 72 hours. Sepsis Labs: No results for input(s): PROCALCITON, LATICACIDVEN in the last 168 hours.  Recent Results (from the past 240 hour(s))  Aerobic Culture w Gram Stain (superficial specimen)     Status: None   Collection Time: 08/11/21  5:10 PM   Specimen: Wound  Result Value Ref Range Status   Specimen Description WOUND RIGHT TOE  Final   Special Requests RT BIG TOE  Final   Gram Stain   Final    FEW FEW  GRAM POSITIVE COCCI FEW GRAM POSITIVE RODS RARE GRAM NEGATIVE RODS RARE GRAM NEGATIVE COCCOBACILLI Performed at [facility], [place] [address] [place], [place] [address]    Culture FEW STAPHYLOCOCCUS AUREUS  Final   Report Status 08/14/2021 FINAL  Final   Organism ID, Bacteria STAPHYLOCOCCUS AUREUS  Final      Susceptibility   Staphylococcus aureus - MIC*    CIPROFLOXACIN <=0.5 SENSITIVE Sensitive     ERYTHROMYCIN >=8 RESISTANT Resistant     GENTAMICIN <=0.5 SENSITIVE Sensitive     OXACILLIN 0.5 SENSITIVE Sensitive     TETRACYCLINE <=1 SENSITIVE Sensitive     VANCOMYCIN <=0.5 SENSITIVE Sensitive     TRIMETH/SULFA <=10 SENSITIVE Sensitive     CLINDAMYCIN RESISTANT Resistant     RIFAMPIN <=0.5 SENSITIVE Sensitive     Inducible Clindamycin POSITIVE Resistant     * FEW STAPHYLOCOCCUS AUREUS  Surgical pcr screen     Status: None   Collection Time: 08/16/21  4:38 AM   Specimen: Nasal Mucosa; Nasal Swab  Result Value Ref Range Status   MRSA, PCR NEGATIVE NEGATIVE Final   Staphylococcus aureus NEGATIVE NEGATIVE Final  Comment: (NOTE) The Xpert SA Assay (FDA approved for NASAL specimens in patients 85 years of age and older), is one component of a comprehensive surveillance program. It is not intended to diagnose infection nor to guide or monitor treatment. Performed at [facility], [place] [address], [place], [place] [address]          Radiology Studies: No results found.      Scheduled Meds:  sodium chloride   Intravenous Once   Chlorhexidine Gluconate Cloth  6 each Topical Q0600   Chlorhexidine Gluconate Cloth  6 each Topical Q0600   cinacalcet  90 mg Oral Daily   [START ON 08/22/2021] darbepoetin (ARANESP) injection - DIALYSIS  150 mcg Intravenous Q Tue-HD   heparin  5,000 Units Subcutaneous Q8H   insulin aspart  0-6 Units Subcutaneous TID WC & HS   insulin glargine-yfgn  15 Units Subcutaneous QHS   LORazepam  1 mg Intravenous Once   losartan  50 mg  Oral Daily   multivitamin  1 tablet Oral QHS   sevelamer carbonate  1.6 g Oral TID WC   sodium chloride flush  3 mL Intravenous Q12H   sodium chloride flush  3 mL Intravenous Q12H   Continuous Infusions:  sodium chloride     sodium chloride 500 mL (08/14/21 2116)    ceFAZolin (ANCEF) IV 2 g (08/19/21 2118)     LOS: 10 days    Time spent: 35 minutes.     Elmarie Shiley, MD [facility]   If 7PM-7AM, please contact night-coverage [email or web address]  08/21/2021, 8:02 AM

## 2021-08-21 NOTE — Progress Notes (Signed)
OT Cancellation Note  Patient Details Name: [name] MRN: [id number] DOB: 08-Jan-1981   Cancelled Treatment:    Reason Eval/Treat Not Completed: Patient declined, no reason specified. Pt reported "I'm good, don't feel like it". Pt also reports 8/10 R foot pain but states that she has has pain meds. OT will follow up next available time.  Emmit Alexanders [facility] 08/21/2021, 2:45 PM

## 2021-08-21 NOTE — Progress Notes (Signed)
[facility] Progress Note   Subjective:   Patient seen in room, no new c/o.   Objective Vitals:   08/21/21 0353 08/21/21 0800 08/21/21 1140 08/21/21 1539  BP: (!) 149/73 116/66 (!) 143/75 (!) 157/89  Pulse:  85 80 87  Resp: 17 18 19 18   Temp: 98.4 F (36.9 C) 98.4 F (36.9 C) 98.4 F (36.9 C) 98.1 F (36.7 C)  TempSrc: Oral Oral Oral Oral  SpO2: 96% 96% 97%   Weight: 64.8 kg     Height:       Physical Exam General:chronically ill appearing female in NAD Heart:RRR, no mrg Lungs:CTAB, nml WOB on RA Abdomen:soft, NTND Extremities:no LE edema, R TMA dressed Dialysis Access: RU AVF +b/t    OP HD: [place] TTS   3h 73min  400/1.5  68.2kg  2/2 bath  15g  Hep 4000  AVF  - mircera 241mcg IV q 2 weeks- last dose 12/27  - hectorol 9 mcg IV q HD   - sensipar 90mg  2 tabs po QHS   - home meds > amlodipine 10mg  QD, renvela 2.4g 1 ac tid   Assessment/ Plan: R great toe diabetic infection: s/p angiogram and 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  IV antibiotics per primary team.  Currently on Ancef 2g IV qHD x 6 weeks with end date 2/14.  Per VVS notes will likely need R BKA next week as TMA is not healing.  ESRD:  HD TTS.  Next HD 08/22/21  Hypertension/volume: BP mildly elevated this AM.  On losartan 50mg  qd.  Does not appear volume overloaded, UF as tolerated. 3-4kg under dry wt, will need to be lowered at d/c.   Anemia CKD and blood loss: outpatient HD notes reveal pt had noticed red blood in her stools and had been referred to GI outpatient. Hgb 6.6 on admit, improved to 9.4 s/p 2 units PRBC's on 12/31. ESA due 08/22/21, aranesp ordered for 1/10.   MBD ckd: Hyperphosphatemia improved.  On renvela packets - increased dose to 1.6 gram TID. Hypercalcemia when corrected for albumin - 10.9 corrected as of 1/6.  We stopped high dose hectorol and restarted sensipar here. Use 2.0 Ca bath.  T2DM: hyperglycemic on admit, had not been taking her insulin at home. Management per primary team. Nutrition -  Renal/carb modified diet w/fluid restrictions.   Kelly Splinter, MD 08/21/2021, 4:48 PM     Additional Objective Labs: Basic Metabolic Panel: Recent Labs  Lab 08/18/21 0500 08/19/21 1750 08/20/21 0845  NA 134* 133* 133*  K 4.3 4.2 3.9  CL 97* 96* 95*  CO2 25 22 22   GLUCOSE 247* 186* 111*  BUN 27* 47* 33*  CREATININE 8.17* 11.63* 8.81*  CALCIUM 9.3 8.5* 9.3  PHOS 6.8* 9.6* 7.6*    CBC: Recent Labs  Lab 08/16/21 0446 08/19/21 1750 08/20/21 0845  WBC 11.2* 12.6* 14.9*  HGB 10.1* 8.6* 9.4*  HCT 31.3* 27.5* 30.1*  MCV 90.7 91.1 90.1  PLT 216 210 216    Medications:  sodium chloride     sodium chloride 500 mL (08/14/21 2116)    ceFAZolin (ANCEF) IV 2 g (08/19/21 2118)    sodium chloride   Intravenous Once   Chlorhexidine Gluconate Cloth  6 each Topical Q0600   Chlorhexidine Gluconate Cloth  6 each Topical Q0600   cinacalcet  90 mg Oral Daily   [START ON 08/22/2021] darbepoetin (ARANESP) injection - DIALYSIS  150 mcg Intravenous Q Tue-HD   feeding supplement  1 Container Oral BID  BM   heparin  5,000 Units Subcutaneous Q8H   insulin aspart  0-6 Units Subcutaneous TID WC & HS   insulin glargine-yfgn  15 Units Subcutaneous QHS   LORazepam  1 mg Intravenous Once   losartan  50 mg Oral Daily   multivitamin  1 tablet Oral QHS   sevelamer carbonate  1.6 g Oral TID WC   sodium chloride flush  3 mL Intravenous Q12H   sodium chloride flush  3 mL Intravenous Q12H

## 2021-08-21 NOTE — Progress Notes (Signed)
[facility] Progress Note   Subjective: Seen in room, asking for cell phone.   Objective Vitals:   08/20/21 2314 08/21/21 0353 08/21/21 0800 08/21/21 1140  BP: 139/67 (!) 149/73 116/66 (!) 143/75  Pulse: 80  85 80  Resp: 12 17 18 19   Temp: 98.3 F (36.8 C) 98.4 F (36.9 C) 98.4 F (36.9 C) 98.4 F (36.9 C)  TempSrc: Oral Oral Oral Oral  SpO2: 95% 96% 96% 97%  Weight:  64.8 kg    Height:       Physical Exam General:chronically ill appearing female in NAD Heart:RRR, no mrg Lungs:CTAB, nml WOB on RA Abdomen:soft, NTND Extremities:no LE edema, L TMA dressed Dialysis Access: RU AVF +b/t    Additional Objective Labs: Basic Metabolic Panel: Recent Labs  Lab 08/18/21 0500 08/19/21 1750 08/20/21 0845  NA 134* 133* 133*  K 4.3 4.2 3.9  CL 97* 96* 95*  CO2 25 22 22   GLUCOSE 247* 186* 111*  BUN 27* 47* 33*  CREATININE 8.17* 11.63* 8.81*  CALCIUM 9.3 8.5* 9.3  PHOS 6.8* 9.6* 7.6*   Liver Function Tests: Recent Labs  Lab 08/18/21 0500 08/19/21 1750 08/20/21 0845  ALBUMIN 2.0* 1.7* 2.1*   No results for input(s): LIPASE, AMYLASE in the last 168 hours. CBC: Recent Labs  Lab 08/16/21 0446 08/19/21 1750 08/20/21 0845  WBC 11.2* 12.6* 14.9*  HGB 10.1* 8.6* 9.4*  HCT 31.3* 27.5* 30.1*  MCV 90.7 91.1 90.1  PLT 216 210 216   Blood Culture    Component Value Date/Time   SDES WOUND RIGHT TOE 08/11/2021 1710   SPECREQUEST RT BIG TOE 08/11/2021 1710   CULT FEW STAPHYLOCOCCUS AUREUS 08/11/2021 1710   REPTSTATUS 08/14/2021 FINAL 08/11/2021 1710    Cardiac Enzymes: No results for input(s): CKTOTAL, CKMB, CKMBINDEX, TROPONINI in the last 168 hours. CBG: Recent Labs  Lab 08/20/21 1804 08/20/21 2102 08/21/21 0403 08/21/21 0506 08/21/21 1142  GLUCAP 70 142* 54* 122* 183*   Iron Studies: No results for input(s): IRON, TIBC, TRANSFERRIN, FERRITIN in the last 72 hours. @lablastinr3 @ Studies/Results: No results found. Medications:  sodium  chloride     sodium chloride 500 mL (08/14/21 2116)    ceFAZolin (ANCEF) IV 2 g (08/19/21 2118)    sodium chloride   Intravenous Once   Chlorhexidine Gluconate Cloth  6 each Topical Q0600   Chlorhexidine Gluconate Cloth  6 each Topical Q0600   cinacalcet  90 mg Oral Daily   [START ON 08/22/2021] darbepoetin (ARANESP) injection - DIALYSIS  150 mcg Intravenous Q Tue-HD   heparin  5,000 Units Subcutaneous Q8H   insulin aspart  0-6 Units Subcutaneous TID WC & HS   insulin glargine-yfgn  15 Units Subcutaneous QHS   LORazepam  1 mg Intravenous Once   losartan  50 mg Oral Daily   multivitamin  1 tablet Oral QHS   sevelamer carbonate  1.6 g Oral TID WC   sodium chloride flush  3 mL Intravenous Q12H   sodium chloride flush  3 mL Intravenous Q12H     Dialysis Orders: [place] [facility]   3h 51min  400/1.5  68.2kg  2/2 bath  15g  Hep 4000  AVF  - mircera 290mcg IV q 2 weeks- last dose 12/27  - hectorol 9 mcg IV q HD   - sensipar 90mg  2 tabs po QHS   - home meds > amlodipine 10mg  QD, renvela 2.4g 1 ac tid   Assessment/ Plan: R great toe diabetic infection: s/p angiogram and  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  IV antibiotics per primary team.  Currently on Ancef 2g IV qHD x 6 weeks with end date 2/14.  Per VVS notes will likely need R BKA next week as TMA is not healing.  ESRD:  HD TTS.  Next HD on 08/22/21  Hypertension/volume: BP mildly elevated this AM.  On losartan 50mg  qd.  Does not appear volume overloaded, UF as tolerated. Getting under dry wt, will need to be lowered in d/c.   Anemia CKD and blood loss: outpatient HD notes reveal pt had noticed red blood in her stools and had been referred to GI outpatient. Hgb 6.6 on admit, last improved to 9.4 s/p 2 units PRBC's on 12/31 Plan for Aranesp 150 mcg on 08/22/21 (when ESA next due)   Metabolic bone disease: Hyperphosphatemia improving.  On renvela packets - increased dose to 1.6 gram TID. Hypercalcemia when corrected for albumin - 10.9 corrected as of 1/6.  We stopped  high dose hectorol.   We restarted sensipar here. Use 2.0 Ca bath.  T2DM: hyperglycemic on admit, had not been taking her insulin at home. Management per primary team. Nutrition - Renal/carb modified diet w/fluid restrictions.     [name] H. Tracey Kistner NP-C 08/21/2021, 12:46 PM  [name] [phone]

## 2021-08-21 NOTE — Progress Notes (Signed)
[facility]  Subjective  - No new complaints   Objective (!) 149/73 80 98.4 F (36.9 C) (Oral) 17 96%  Intake/Output Summary (Last 24 hours) at 08/21/2021 0751 Last data filed at 08/20/2021 2106 Gross per 24 hour  Intake 823 ml  Output --  Net 823 ml    Right TMA dressing changed this am by RN, clean and dry Lungs non labored breathing Alert and oriented, no acute distress  Assessment/Planning: POD # 4 41 y.o. female is s/p R TMA  Dry dressing changes daily We will change the dressing dressing tomorrow If the TMA fails to heal she will possibly require right BKA.  This could be done on Wednesday.   [name] 08/21/2021 7:51 AM --  Laboratory Lab Results: Recent Labs    08/19/21 1750 08/20/21 0845  WBC 12.6* 14.9*  HGB 8.6* 9.4*  HCT 27.5* 30.1*  PLT 210 216   BMET Recent Labs    08/19/21 1750 08/20/21 0845  NA 133* 133*  K 4.2 3.9  CL 96* 95*  CO2 22 22  GLUCOSE 186* 111*  BUN 47* 33*  CREATININE 11.63* 8.81*  CALCIUM 8.5* 9.3    COAG Lab Results  Component Value Date   INR 1.2 08/10/2021   INR 1.1 06/30/2020   No results found for: PTT

## 2021-08-21 NOTE — Progress Notes (Addendum)
Initial Nutrition Assessment  DOCUMENTATION CODES:   Not applicable  INTERVENTION:   Boost Breeze po TID, each supplement provides 250 kcal and 9 grams of protein. Renal MVI daily. Recommend check vitamin C and zinc as deficiencies could impede wound healing. Discussed with attending physician, will check labs.   NUTRITION DIAGNOSIS:   Increased nutrient needs related to wound healing as evidenced by estimated needs.  GOAL:   Patient will meet greater than or equal to 90% of their needs  MONITOR:   PO intake, Supplement acceptance, Labs, Skin  REASON FOR ASSESSMENT:   Consult Assessment of nutrition requirement/status  ASSESSMENT:   41 yo female admitted with R foot gangrene. PMH includes anemia, ESRD on HD, DM-2, retinopathy, blind R eye, HTN.  S/P open right foot transmetatarsal amputation on 12/30. S/P closure of TMA and wound VAC placement on 1/4. She may require a BKA if the TMA does not heal well.   Patient reports good intake of meals. She likes the boost breeze supplements; prefers the peach flavor. Discussed importance of adequate protein and calorie intake to promote healing.   Currently on a renal carbohydrate modified diet with 1200 ml fluid restriction. Meal intakes: 40-100%  Weight history reviewed.  Weight was 73.5 kg one year ago.  Admission weight 72 kg.  Current weight 64.8 kg. Weight has decreased since admission d/t negative fluid balance and a small amount of weight loss from TMA (~1%). Weight loss over the past year ~12%, not significant for the time frame.   NUTRITION - FOCUSED PHYSICAL EXAM:  Flowsheet Row Most Recent Value  Orbital Region No depletion  Upper Arm Region No depletion  Thoracic and Lumbar Region No depletion  Buccal Region No depletion  Temple Region No depletion  Clavicle Bone Region No depletion  Clavicle and Acromion Bone Region No depletion  Scapular Bone Region No depletion  Dorsal Hand No depletion  Patellar  Region Mild depletion  Anterior Thigh Region Mild depletion  Posterior Calf Region Moderate depletion  Edema (RD Assessment) None  Hair Reviewed  Eyes Reviewed  Mouth Reviewed  Skin Reviewed  Nails Reviewed       Diet Order:   Diet Order             Diet renal/carb modified with fluid restriction Diet-HS Snack? Nothing; Fluid restriction: 1200 mL Fluid; Room service appropriate? Yes; Fluid consistency: Thin  Diet effective now                   EDUCATION NEEDS:   Not appropriate for education at this time  Skin:  Skin Assessment: Skin Integrity Issues: Skin Integrity Issues:: Incisions Incisions: R foot TMA  Last BM:  1/9 type 5  Height:   Ht Readings from Last 1 Encounters:  08/10/21 5\' 1"  (1.549 m)    Weight:   Wt Readings from Last 1 Encounters:  08/21/21 64.8 kg    BMI:  Body mass index is 27 kg/m.  Estimated Nutritional Needs:   Kcal:  1900-2100  Protein:  90-110 gm  Fluid:  >/= 2 L    Lucas Mallow RD, LDN, CNSC Please refer to Amion for contact information.

## 2021-08-21 NOTE — Progress Notes (Signed)
PT Cancellation Note  Patient Details Name: [name] MRN: [id number] DOB: 1981/02/11   Cancelled Treatment:    Reason Eval/Treat Not Completed: Patient declined, no reason specified   Shary Decamp [facility] 08/21/2021, 5:27 PM [place] Pager [phone] Office [phone]

## 2021-08-22 DIAGNOSIS — I96 Gangrene, not elsewhere classified: Secondary | ICD-10-CM | POA: Diagnosis not present

## 2021-08-22 LAB — GLUCOSE, CAPILLARY
Glucose-Capillary: 158 mg/dL — ABNORMAL HIGH (ref 70–99)
Glucose-Capillary: 101 mg/dL — ABNORMAL HIGH (ref 70–99)
Glucose-Capillary: 181 mg/dL — ABNORMAL HIGH (ref 70–99)
Glucose-Capillary: 148 mg/dL — ABNORMAL HIGH (ref 70–99)

## 2021-08-22 LAB — CBC
HCT: 27.4 % — ABNORMAL LOW (ref 36.0–46.0)
Hemoglobin: 8.3 g/dL — ABNORMAL LOW (ref 12.0–15.0)
MCH: 27.4 pg (ref 26.0–34.0)
MCHC: 30.3 g/dL (ref 30.0–36.0)
MCV: 90.4 fL (ref 80.0–100.0)
Platelets: 297 10*3/uL (ref 150–400)
RBC: 3.03 MIL/uL — ABNORMAL LOW (ref 3.87–5.11)
RDW: 16.3 % — ABNORMAL HIGH (ref 11.5–15.5)
WBC: 12.6 10*3/uL — ABNORMAL HIGH (ref 4.0–10.5)
nRBC: 0 % (ref 0.0–0.2)

## 2021-08-22 LAB — RENAL FUNCTION PANEL
Albumin: 1.8 g/dL — ABNORMAL LOW (ref 3.5–5.0)
Anion gap: 17 — ABNORMAL HIGH (ref 5–15)
BUN: 51 mg/dL — ABNORMAL HIGH (ref 6–20)
CO2: 21 mmol/L — ABNORMAL LOW (ref 22–32)
Calcium: 9.1 mg/dL (ref 8.9–10.3)
Chloride: 98 mmol/L (ref 98–111)
Creatinine, Ser: 12.97 mg/dL — ABNORMAL HIGH (ref 0.44–1.00)
GFR, Estimated: 3 mL/min — ABNORMAL LOW (ref >60)
Glucose, Bld: 125 mg/dL — ABNORMAL HIGH (ref 70–99)
Phosphorus: 7.9 mg/dL — ABNORMAL HIGH (ref 2.5–4.6)
Potassium: 4.9 mmol/L (ref 3.5–5.1)
Sodium: 136 mmol/L (ref 135–145)

## 2021-08-22 MED ORDER — LIDOCAINE HCL (PF) 1 % IJ SOLN: 5.0000 mL | INTRAMUSCULAR | Status: DC | PRN

## 2021-08-22 MED ORDER — HEPARIN SODIUM (PORCINE) 1000 UNIT/ML IJ SOLN
INTRAMUSCULAR | Status: AC
  Filled 2021-08-22: qty 4

## 2021-08-22 MED ORDER — LIDOCAINE-PRILOCAINE 2.5-2.5 % EX CREA: 1.0000 "application " | TOPICAL_CREAM | CUTANEOUS | Status: DC | PRN

## 2021-08-22 MED ORDER — SODIUM CHLORIDE 0.9 % IV SOLN: 100.0000 mL | INTRAVENOUS | Status: DC | PRN

## 2021-08-22 MED ORDER — HEPARIN SODIUM (PORCINE) 1000 UNIT/ML DIALYSIS: 2000.0000 [IU] | INTRAMUSCULAR | Status: DC | PRN

## 2021-08-22 MED ORDER — PENTAFLUOROPROP-TETRAFLUOROETH EX AERO: 1.0000 "application " | INHALATION_SPRAY | CUTANEOUS | Status: DC | PRN

## 2021-08-22 NOTE — Progress Notes (Signed)
[name] Progress Note   Subjective: Seen on HD in good spirits today. No C/Os.    Objective Vitals:   08/22/21 1000 08/22/21 1030 08/22/21 1100 08/22/21 1130  BP: 138/75 133/75 (!) 145/75 139/61  Pulse: 95 90 99 97  Resp:      Temp:      TempSrc:      SpO2:      Weight:      Height:       Physical Exam General:chronically ill appearing female in NAD Heart:RRR, no mrg Lungs:CTAB, nml WOB on RA Abdomen:soft, NTND Extremities:no LE edema, L TMA dressed Dialysis Access: RU AVF +b/t    Additional Objective Labs: Basic Metabolic Panel: Recent Labs  Lab 08/19/21 1750 08/20/21 0845 08/22/21 0952  NA 133* 133* 136  K 4.2 3.9 4.9  CL 96* 95* 98  CO2 22 22 21*  GLUCOSE 186* 111* 125*  BUN 47* 33* 51*  CREATININE 11.63* 8.81* 12.97*  CALCIUM 8.5* 9.3 9.1  PHOS 9.6* 7.6* 7.9*   Liver Function Tests: Recent Labs  Lab 08/19/21 1750 08/20/21 0845 08/22/21 0952  ALBUMIN 1.7* 2.1* 1.8*   No results for input(s): LIPASE, AMYLASE in the last 168 hours. CBC: Recent Labs  Lab 08/16/21 0446 08/19/21 1750 08/20/21 0845 08/22/21 0952  WBC 11.2* 12.6* 14.9* 12.6*  HGB 10.1* 8.6* 9.4* 8.3*  HCT 31.3* 27.5* 30.1* 27.4*  MCV 90.7 91.1 90.1 90.4  PLT 216 210 216 297   Blood Culture    Component Value Date/Time   SDES WOUND RIGHT TOE 08/11/2021 1710   SPECREQUEST RT BIG TOE 08/11/2021 1710   CULT FEW STAPHYLOCOCCUS AUREUS 08/11/2021 1710   REPTSTATUS 08/14/2021 FINAL 08/11/2021 1710    Cardiac Enzymes: No results for input(s): CKTOTAL, CKMB, CKMBINDEX, TROPONINI in the last 168 hours. CBG: Recent Labs  Lab 08/21/21 1142 08/21/21 1536 08/21/21 2123 08/21/21 2216 08/22/21 0606  GLUCAP 183* 113* 69* 90 158*   Iron Studies: No results for input(s): IRON, TIBC, TRANSFERRIN, FERRITIN in the last 72 hours. @lablastinr3 @ Studies/Results: No results found. Medications:  sodium chloride     sodium chloride 500 mL (08/14/21 2116)   sodium  chloride     sodium chloride      ceFAZolin (ANCEF) IV 2 g (08/19/21 2118)    sodium chloride   Intravenous Once   Chlorhexidine Gluconate Cloth  6 each Topical Q0600   Chlorhexidine Gluconate Cloth  6 each Topical Q0600   cinacalcet  90 mg Oral Daily   darbepoetin (ARANESP) injection - DIALYSIS  150 mcg Intravenous Q Tue-HD   feeding supplement  1 Container Oral BID BM   heparin  5,000 Units Subcutaneous Q8H   insulin aspart  0-6 Units Subcutaneous TID WC & HS   insulin glargine-yfgn  15 Units Subcutaneous QHS   LORazepam  1 mg Intravenous Once   losartan  50 mg Oral Daily   multivitamin  1 tablet Oral QHS   sevelamer carbonate  1.6 g Oral TID WC   sodium chloride flush  3 mL Intravenous Q12H   sodium chloride flush  3 mL Intravenous Q12H     Dialysis Orders: [facility]   3h 57min  400/1.5  68.2kg  2/2 bath  15g  Hep 4000  AVF  - mircera 254mcg IV q 2 weeks- last dose 12/27  - hectorol 9 mcg IV q HD   - sensipar 90mg  2 tabs po QHS   - home meds > amlodipine 10mg  QD, renvela 2.4g  1 ac tid   Assessment/ Plan: R great toe diabetic infection: s/p angiogram and 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  IV antibiotics per primary team.  Currently on Ancef 2g IV qHD x 6 weeks with end date 2/14.  Per VVS notes will likely need R BKA next week as TMA is not healing.  ESRD:  HD TTS.  Next HD on 08/24/21  Hypertension/volume: BP mildly elevated this AM.  On losartan 50mg  qd.  Does not appear volume overloaded, UF as tolerated. Getting under dry wt, will need to be lowered in d/c.   Anemia CKD and blood loss: outpatient HD notes reveal pt had noticed red blood in her stools and had been referred to GI outpatient. Hgb 6.6 on admit, last improved to 9.4 s/p 2 units PRBC's on 12/31. HGB 8.6 today. Plan for Aranesp 150 mcg on 08/22/21 (when ESA next due)   Metabolic bone disease: Hyperphosphatemia improving.  On renvela packets - increased dose to 1.6 gram TID. Hypercalcemia when corrected for albumin - 10.9 corrected as  of 1/6.  We stopped high dose hectorol.   We restarted sensipar here. Use 2.0 Ca bath.  T2DM: hyperglycemic on admit, had not been taking her insulin at home. Management per primary team. Nutrition - Renal/carb modified diet w/fluid restrictions.   Vernell Back H. Tracey Saia NP-C 08/22/2021, 12:11 PM  [employer] [phone]

## 2021-08-22 NOTE — Progress Notes (Addendum)
[facility]  Subjective  - [name]   Objective (!) 149/76 78 98.8 F (37.1 C) (Oral) 16 98%  Intake/Output Summary (Last 24 hours) at 08/22/2021 0751 Last data filed at 08/21/2021 2303 Gross per 24 hour  Intake 320 ml  Output --  Net 320 ml        Medial plantar flap darkening of skin, tender to palpation plantar foot, no drainage or malodor.  Left incision open to air. Doppler signals DP/Peroneal intact Lungs non labored breathing   Assessment/Planning: POD # 5 41 y.o. female is s/p R TMA  Doppler signals good, incision intact with medial skin changes Dr. Trula Slade will examine the patient pending demarcation verse BKA. Incision left Open to air. Patient [name]  Roxy Horseman 08/22/2021 7:51 AM --  Laboratory Lab Results: Recent Labs    08/19/21 1750 08/20/21 0845  WBC 12.6* 14.9*  HGB 8.6* 9.4*  HCT 27.5* 30.1*  PLT 210 216   BMET Recent Labs    08/19/21 1750 08/20/21 0845  NA 133* 133*  K 4.2 3.9  CL 96* 95*  CO2 22 22  GLUCOSE 186* 111*  BUN 47* 33*  CREATININE 11.63* 8.81*  CALCIUM 8.5* 9.3    COAG Lab Results  Component Value Date   INR 1.2 08/10/2021   INR 1.1 06/30/2020   No results found for: PTT  I agree with the above.  Have seen and evaluated the patient.  I continue to think that her risk of limb salvage is small however her transmetatarsal amputation site has not broken down.  She is not having any significant Walker.  Therefore, I think we can monitor this for a little longer to see if she can get this to heal and avoid a below-knee amputation  [name]

## 2021-08-22 NOTE — Progress Notes (Signed)
Physical Therapy Treatment Patient Details Name: [name] MRN: [id number] DOB: 1981-04-20 Today's Date: 08/22/2021   History of Present Illness 41 year old with history of type 2 diabetes on insulin, ESRD on hemodialysis TTS schedule, neuropathy, hypertension, R eye blindness, with poor compliance came to the emergency room with right foot pain and infection.  She was found to have gangrenous toes.  Underwent angiogram and urgent transmetatarsal amputation.    PT Comments    Pt seen for exercises but refused any mobility. Pt is self limiting with mobility. She plans to return home. Will need significant assist and equipment if she returns home. If pt has further surgery will update recommendations as appropriate.    Recommendations for follow up therapy are one component of a multi-disciplinary discharge planning process, led by the attending physician.  Recommendations may be updated based on patient status, additional functional criteria and insurance authorization.  Follow Up Recommendations  Home health PT     Assistance Recommended at Discharge Frequent or constant Supervision/Assistance  Patient can return home with the following Assist for transportation;A lot of help with walking and/or transfers;Other (comment)   Equipment Recommendations  Wheelchair (measurements PT);Wheelchair cushion (measurements PT);Rolling walker (2 wheels);BSC/3in1;Hospital bed    Recommendations for Other Services       Precautions / Restrictions Precautions Precautions: Fall Required Braces or Orthoses: Other Brace Other Brace: Darco shoe R LE Restrictions RLE Weight Bearing: Partial weight bearing RLE Partial Weight Bearing Percentage or Pounds: heel weight bearing     Mobility  Bed Mobility               General bed mobility comments: Pt refused    Transfers                   General transfer comment: Pt refused    Ambulation/Gait                    Stairs             Wheelchair Mobility    Modified Rankin (Stroke Patients Only)       Balance                                            Cognition Arousal/Alertness: Awake/alert Behavior During Therapy: Flat affect Overall Cognitive Status: No family/caregiver present to determine baseline cognitive functioning                                          Exercises General Exercises - Lower Extremity Quad Sets: AROM;10 reps;Supine;Both Heel Slides: AROM;Right;10 reps;Supine Hip ABduction/ADduction: AROM;Both;10 reps;Supine Straight Leg Raises: AAROM;Both;10 reps;Supine    General Comments        Pertinent Vitals/Pain Pain Assessment: Faces Faces Pain Scale: Hurts even more Pain Location: R foot Pain Descriptors / Indicators: Sore Pain Intervention(s): Limited activity within patient's tolerance    Home Living                          Prior Function            PT Goals (current goals can now be found in the care plan section) Progress towards PT goals: Not progressing toward goals - comment (Pt self limiting)  Frequency    Min 3X/week      PT Plan Current plan remains appropriate    Co-evaluation              AM-PAC PT "6 Clicks" Mobility   Outcome Measure  Help needed turning from your back to your side while in a flat bed without using bedrails?: None Help needed moving from lying on your back to sitting on the side of a flat bed without using bedrails?: A Little Help needed moving to and from a bed to a chair (including a wheelchair)?: Total Help needed standing up from a chair using your arms (e.g., wheelchair or bedside chair)?: Total Help needed to walk in hospital room?: Total Help needed climbing 3-5 steps with a railing? : Total 6 Click Score: 11    End of Session   Activity Tolerance: Other (comment) (self limiting) Patient left: in bed;with call bell/phone within reach    PT Visit Diagnosis: Other abnormalities of gait and mobility (R26.89);Muscle weakness (generalized) (M62.81);Pain Pain - Right/Left: Right Pain - part of body: Ankle and joints of foot     Time: 1430-1441 PT Time Calculation (min) (ACUTE ONLY): 11 min  Charges:  $Therapeutic Exercise: 8-22 mins                     [place] Pager [phone] Office [place] 08/22/2021, 2:55 PM

## 2021-08-22 NOTE — Progress Notes (Signed)
PROGRESS NOTE    [name]  IRW:[id number] DOB: [birth year] DOA: 08/10/2021 PCP: [facility]   Brief Narrative: 41 year old with past medical history significant for diabetes type 2 on insulin, ESRD on hemodialysis TTS schedule, neuropathy, hypertension, poor compliance presented to the ED with right foot pain and infection.  She was found to have gangrenous toes.  Underwent angiogram and urgent transmetatarsal amputation.   Assessment & Plan:   Principal Problem:   Gangrene of right foot (HCC) Active Problems:   Anemia of chronic disease   ESRD on hemodialysis ([place])   Uncontrolled type 2 diabetes mellitus with hyperglycemia (HCC)   Hyponatremia  1-Severe right foot infection/gas gangrene of right foot: Present on admission -Status post open right foot with amputation on 12/30 -Revision of the right foot transmetatarsal amputation/placement of wound VAC 1/4.  Currently wound VAC removed 1/6 -Wound culture positive for MSSA, antibiotic changed to Ancef.  Patient might need Ancef with hemodialysis, depending if she doesn't get BKA.  -Vascular is considering right BKA depending on wound improvement.  Dr Trula Slade to follow up on patient.  -Continue to monitor white blood cell  ESRD on hemodialysis TTS, anemia of chronic disease: Status post 2 unit of packed red blood cell transfusion 12/31 Hemoglobin remained stable. 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  Hyperglycemia and hypoglycemia -CBG fluctuates -episodes of low blood sugar, plan to decrease Semglee to 15 units (1/09) -She has not required a sliding scale insulin -She will need to follow-up with Dr. Hardie Lora, endocrinologist  -continue with current dose, if continue to have hypoglycemia will need reduce dose of Semeglee.   Hypertension: Continue with Cozaar  Hypoalbuminemia; started  breeze.   Estimated body mass index is 25.33 kg/m as calculated  from the following:   Height as of this encounter: 5\' 1"  (1.549 m).   Weight as of this encounter: 60.8 kg.   DVT prophylaxis: Heparin Code Status: Full code Family Communication: Care discussed with patient Disposition Plan:  Status is: Inpatient  Remains inpatient appropriate because: Patient admitted with right foot infection gangrenous, vascular following, patient might require further surgery and amputation        Consultants:  Nephrology Vascular surgery  Procedures:  Status post open right foot amputation on 12/30 -Revision of right foot transmetatarsal amputation /placement of wound VAC on 1/4  Antimicrobials:  IV Ancef  Subjective: I saw patient in HD unit. She denies pain. She ate breakfast. I encourage her to eat.   Objective: Vitals:   08/22/21 1215 08/22/21 1230 08/22/21 1240 08/22/21 1347  BP: (!) 191/85 (!) 177/85  (!) 107/56  Pulse: (!) 125 (!) 101  75  Resp: 20 20  20   Temp: 97.6 F (36.4 C) 97.6 F (36.4 C)  98.7 F (37.1 C)  TempSrc:    Oral  SpO2:  100%  98%  Weight:   60.8 kg   Height:        Intake/Output Summary (Last 24 hours) at 08/22/2021 1355 Last data filed at 08/22/2021 1230 Gross per 24 hour  Intake 240 ml  Output 1478 ml  Net -1238 ml    Filed Weights   08/22/21 0608 08/22/21 0831 08/22/21 1240  Weight: 65.5 kg 63.2 kg 60.8 kg    Examination:  General exam: NAD Respiratory system: CTA Cardiovascular system: S 1. S 2 RRR Gastrointestinal system: BS present, soft, nt Central nervous system: Alert, follows command Extremities: Right foot status post transmetatarsal amputation with  clean dressing   Data Reviewed: I have personally reviewed following labs and imaging studies  CBC: Recent Labs  Lab 08/16/21 0446 08/19/21 1750 08/20/21 0845 08/22/21 0952  WBC 11.2* 12.6* 14.9* 12.6*  HGB 10.1* 8.6* 9.4* 8.3*  HCT 31.3* 27.5* 30.1* 27.4*  MCV 90.7 91.1 90.1 90.4  PLT 216 210 216 419    Basic Metabolic  Panel: Recent Labs  Lab 08/17/21 0901 08/18/21 0500 08/19/21 1750 08/20/21 0845 08/22/21 0952  NA 134* 134* 133* 133* 136  K 4.0 4.3 4.2 3.9 4.9  CL 99 97* 96* 95* 98  CO2 23 25 22 22  21*  GLUCOSE 146* 247* 186* 111* 125*  BUN 42* 27* 47* 33* 51*  CREATININE 10.25* 8.17* 11.63* 8.81* 12.97*  CALCIUM 9.7 9.3 8.5* 9.3 9.1  PHOS 8.2* 6.8* 9.6* 7.6* 7.9*    GFR: Estimated Creatinine Clearance: 4.9 mL/min (A) (by C-G formula based on SCr of 12.97 mg/dL (H)). Liver Function Tests: Recent Labs  Lab 08/17/21 0901 08/18/21 0500 08/19/21 1750 08/20/21 0845 08/22/21 0952  ALBUMIN 1.9* 2.0* 1.7* 2.1* 1.8*    No results for input(s): LIPASE, AMYLASE in the last 168 hours. No results for input(s): AMMONIA in the last 168 hours. Coagulation Profile: No results for input(s): INR, PROTIME in the last 168 hours. Cardiac Enzymes: No results for input(s): CKTOTAL, CKMB, CKMBINDEX, TROPONINI in the last 168 hours. BNP (last 3 results) No results for input(s): PROBNP in the last 8760 hours. HbA1C: No results for input(s): HGBA1C in the last 72 hours. CBG: Recent Labs  Lab 08/21/21 1142 08/21/21 1536 08/21/21 2123 08/21/21 2216 08/22/21 0606  GLUCAP 183* 113* 69* 90 158*    Lipid Profile: No results for input(s): CHOL, HDL, LDLCALC, TRIG, CHOLHDL, LDLDIRECT in the last 72 hours. Thyroid Function Tests: No results for input(s): TSH, T4TOTAL, FREET4, T3FREE, THYROIDAB in the last 72 hours. Anemia Panel: No results for input(s): VITAMINB12, FOLATE, FERRITIN, TIBC, IRON, RETICCTPCT in the last 72 hours. Sepsis Labs: No results for input(s): PROCALCITON, LATICACIDVEN in the last 168 hours.  Recent Results (from the past 240 hour(s))  Surgical pcr screen     Status: None   Collection Time: 08/16/21  4:38 AM   Specimen: Nasal Mucosa; Nasal Swab  Result Value Ref Range Status   MRSA, PCR NEGATIVE NEGATIVE Final   Staphylococcus aureus NEGATIVE NEGATIVE Final    Comment:  (NOTE) The Xpert SA Assay (FDA approved for NASAL specimens in patients 39 years of age and older), is one component of a comprehensive surveillance program. It is not intended to diagnose infection nor to guide or monitor treatment. Performed at [facility], [place] [address] [place], [place] [address]           Radiology Studies: No results found.      Scheduled Meds:  sodium chloride   Intravenous Once   Chlorhexidine Gluconate Cloth  6 each Topical Q0600   Chlorhexidine Gluconate Cloth  6 each Topical Q0600   cinacalcet  90 mg Oral Daily   darbepoetin (ARANESP) injection - DIALYSIS  150 mcg Intravenous Q Tue-HD   feeding supplement  1 Container Oral BID BM   heparin  5,000 Units Subcutaneous Q8H   insulin aspart  0-6 Units Subcutaneous TID WC & HS   insulin glargine-yfgn  15 Units Subcutaneous QHS   LORazepam  1 mg Intravenous Once   losartan  50 mg Oral Daily   multivitamin  1 tablet Oral QHS   sevelamer carbonate  1.6 g Oral TID WC   sodium chloride flush  3 mL Intravenous Q12H   sodium chloride flush  3 mL Intravenous Q12H   Continuous Infusions:  sodium chloride     sodium chloride 500 mL (08/14/21 2116)    ceFAZolin (ANCEF) IV 2 g (08/19/21 2118)     LOS: 11 days    Time spent: 35 minutes.     Elmarie Shiley, MD [facility]   If 7PM-7AM, please contact night-coverage [email or web address]  08/22/2021, 1:55 PM

## 2021-08-22 NOTE — Progress Notes (Signed)
PT Cancellation Note  Patient Details Name: [name] MRN: [id number] DOB: 11-Dec-1980   Cancelled Treatment:    Reason Eval/Treat Not Completed: Patient at procedure or test/unavailable. Pt in HD. Will continue attempts.   [place] 08/22/2021, 12:19 PM [place] [place] Pager [phone] Office [phone]

## 2021-08-23 ENCOUNTER — Encounter (HOSPITAL_COMMUNITY)
Admission: EM | Disposition: A | Discharge status: Home Health | Payer: Self-pay | Source: Home / Self Care | Attending: Internal Medicine

## 2021-08-23 DIAGNOSIS — I96 Gangrene, not elsewhere classified: Secondary | ICD-10-CM | POA: Diagnosis not present

## 2021-08-23 DIAGNOSIS — E871 Hypo-osmolality and hyponatremia: Secondary | ICD-10-CM | POA: Diagnosis not present

## 2021-08-23 DIAGNOSIS — D638 Anemia in other chronic diseases classified elsewhere: Secondary | ICD-10-CM | POA: Diagnosis not present

## 2021-08-23 DIAGNOSIS — N186 End stage renal disease: Secondary | ICD-10-CM | POA: Diagnosis not present

## 2021-08-23 LAB — GLUCOSE, CAPILLARY
Glucose-Capillary: 116 mg/dL — ABNORMAL HIGH (ref 70–99)
Glucose-Capillary: 151 mg/dL — ABNORMAL HIGH (ref 70–99)
Glucose-Capillary: 93 mg/dL (ref 70–99)
Glucose-Capillary: 60 mg/dL — ABNORMAL LOW (ref 70–99)
Glucose-Capillary: 59 mg/dL — ABNORMAL LOW (ref 70–99)
Glucose-Capillary: 65 mg/dL — ABNORMAL LOW (ref 70–99)
Glucose-Capillary: 84 mg/dL (ref 70–99)
Glucose-Capillary: 169 mg/dL — ABNORMAL HIGH (ref 70–99)

## 2021-08-23 LAB — CBC
HCT: 29 % — ABNORMAL LOW (ref 36.0–46.0)
Hemoglobin: 8.5 g/dL — ABNORMAL LOW (ref 12.0–15.0)
MCH: 26.8 pg (ref 26.0–34.0)
MCHC: 29.3 g/dL — ABNORMAL LOW (ref 30.0–36.0)
MCV: 91.5 fL (ref 80.0–100.0)
Platelets: 266 10*3/uL (ref 150–400)
RBC: 3.17 MIL/uL — ABNORMAL LOW (ref 3.87–5.11)
RDW: 16.3 % — ABNORMAL HIGH (ref 11.5–15.5)
WBC: 11.2 10*3/uL — ABNORMAL HIGH (ref 4.0–10.5)
nRBC: 0 % (ref 0.0–0.2)

## 2021-08-23 SURGERY — AMPUTATION BELOW KNEE
Anesthesia: General | Site: Knee | Laterality: Right

## 2021-08-23 MED ORDER — INSULIN GLARGINE-YFGN 100 UNIT/ML ~~LOC~~ SOLN: 12.0000 [IU] | Freq: Every day | SUBCUTANEOUS | Status: DC

## 2021-08-23 MED ORDER — INSULIN GLARGINE-YFGN 100 UNIT/ML ~~LOC~~ SOLN
12.0000 [IU] | Freq: Every day | SUBCUTANEOUS | Status: DC
  Administered 2021-08-24 (×2): 12 [IU] via SUBCUTANEOUS
  Filled 2021-08-23 (×3): qty 0.12

## 2021-08-23 NOTE — Progress Notes (Addendum)
[facility] Progress Note   Subjective: Seen by VVS this AM. Stable from their prospective for discharge. HD tomorrow on schedule.      Objective Vitals:   08/22/21 2300 08/23/21 0305 08/23/21 0500 08/23/21 0819  BP: 132/61 (!) 145/43  (!) 141/65  Pulse: 83 90  90  Resp: 20 18  20   Temp: 98.6 F (37 C) 98.3 F (36.8 C)    TempSrc: Oral Oral    SpO2: 100% 98%  97%  Weight:   65.2 kg   Height:       Physical Exam General:chronically ill appearing female in NAD Neuro: Oriented X 3 Heart:RRR, no mrg Lungs:CTAB, nml WOB on RA Abdomen:soft, NTND Extremities:no LE edema, L TMA dressed Dialysis Access: R AVF   Additional Objective Labs: Basic Metabolic Panel: Recent Labs  Lab 08/19/21 1750 08/20/21 0845 08/22/21 0952  NA 133* 133* 136  K 4.2 3.9 4.9  CL 96* 95* 98  CO2 22 22 21*  GLUCOSE 186* 111* 125*  BUN 47* 33* 51*  CREATININE 11.63* 8.81* 12.97*  CALCIUM 8.5* 9.3 9.1  PHOS 9.6* 7.6* 7.9*   Liver Function Tests: Recent Labs  Lab 08/19/21 1750 08/20/21 0845 08/22/21 0952  ALBUMIN 1.7* 2.1* 1.8*   No results for input(s): LIPASE, AMYLASE in the last 168 hours. CBC: Recent Labs  Lab 08/19/21 1750 08/20/21 0845 08/22/21 0952 08/23/21 0651  WBC 12.6* 14.9* 12.6* 11.2*  HGB 8.6* 9.4* 8.3* 8.5*  HCT 27.5* 30.1* 27.4* 29.0*  MCV 91.1 90.1 90.4 91.5  PLT 210 216 297 266   Blood Culture    Component Value Date/Time   SDES WOUND RIGHT TOE 08/11/2021 1710   SPECREQUEST RT BIG TOE 08/11/2021 1710   CULT FEW STAPHYLOCOCCUS AUREUS 08/11/2021 1710   REPTSTATUS 08/14/2021 FINAL 08/11/2021 1710    Cardiac Enzymes: No results for input(s): CKTOTAL, CKMB, CKMBINDEX, TROPONINI in the last 168 hours. CBG: Recent Labs  Lab 08/22/21 1332 08/22/21 1630 08/22/21 2156 08/23/21 0620 08/23/21 1116  GLUCAP 101* 181* 148* 116* 151*   Iron Studies: No results for input(s): IRON, TIBC, TRANSFERRIN, FERRITIN in the last 72  hours. @lablastinr3 @ Studies/Results: No results found. Medications:  sodium chloride     sodium chloride 500 mL (08/14/21 2116)    ceFAZolin (ANCEF) IV 2 g (08/22/21 1716)    sodium chloride   Intravenous Once   Chlorhexidine Gluconate Cloth  6 each Topical Q0600   Chlorhexidine Gluconate Cloth  6 each Topical Q0600   cinacalcet  90 mg Oral Daily   darbepoetin (ARANESP) injection - DIALYSIS  150 mcg Intravenous Q Tue-HD   feeding supplement  1 Container Oral BID BM   heparin  5,000 Units Subcutaneous Q8H   insulin aspart  0-6 Units Subcutaneous TID WC & HS   insulin glargine-yfgn  15 Units Subcutaneous QHS   LORazepam  1 mg Intravenous Once   losartan  50 mg Oral Daily   multivitamin  1 tablet Oral QHS   sevelamer carbonate  1.6 g Oral TID WC   sodium chloride flush  3 mL Intravenous Q12H   sodium chloride flush  3 mL Intravenous Q12H     Dialysis Orders: [facility]   3h 22min  400/1.5  68.2kg  2/2 bath  15g  Hep 4000  AVF  - mircera 252mcg IV q 2 weeks- last dose 12/27  - hectorol 9 mcg IV q HD   - sensipar 90mg  2 tabs po QHS   - home meds >  amlodipine 10mg  QD, renvela 2.4g 1 ac tid   Assessment/ Plan: R great toe diabetic infection: s/p angiogram and 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  IV antibiotics per primary team.  Currently on Ancef 2g IV qHD x 6 weeks with end date 2/14.  Per VVS notes stable for DC with HH. No plans for further amputation at this point, F/U with VVS in one week as OP. Can continue ABX as OP if needed.   ESRD:  HD TTS.  Next HD on 08/24/21  Hypertension/volume: BP mildly elevated this AM.  On losartan 50mg  qd.  Does not appear volume overloaded, UF as tolerated. Very much under OP EDW will need to be lowered on DC.   Anemia CKD and blood loss: outpatient HD notes reveal pt had noticed red blood in her stools and had been referred to GI outpatient. Hgb 6.6 on admit, last improved to 9.4 s/p 2 units PRBC's on 12/31. HGB 8.5 today. Given Aranesp 150 mcg on 08/22/21.    Metabolic bone disease: Hyperphosphatemia improving.  On renvela packets - increased dose to 1.6 gram TID. Hypercalcemia when corrected for albumin - 10.9 corrected as of 1/6.  We stopped high dose hectorol.   We restarted sensipar here. Use 2.0 Ca bath.  T2DM: hyperglycemic on admit, had not been taking her insulin at home. Management per primary team. Nutrition - Renal/carb modified diet w/fluid restrictions.  Amond Speranza H. Ricco Dershem NP-C 08/23/2021, 12:08 PM  [employer] [phone]

## 2021-08-23 NOTE — Progress Notes (Signed)
Occupational Therapy Treatment Patient Details Name: [name] MRN: [id number] DOB: [birth year] Today's Date: 08/23/2021   History of present illness 41 year old with history of type 2 diabetes on insulin, ESRD on hemodialysis TTS schedule, neuropathy, hypertension, R eye blindness, with poor compliance came to the emergency room with right foot pain and infection.  She was found to have gangrenous toes.  Underwent angiogram and urgent transmetatarsal amputation.   OT comments  Pt noted to be drowzy and getting IV medications. Recommendation for oral medication management for pending d/c pain management and evaluation of needs to help with drowsiness. Contacted MD regarding concerns. Pt completed full bath at sink level in chair and recommend staff continue this care plan to help with behavioral management. Recommendation for home per notes so recommend HHOT.   VSS stable no changes in symptoms or vitals with medication given at conclusion of session showing ability to tolerate pain levels    Recommendations for follow up therapy are one component of a multi-disciplinary discharge planning process, led by the attending physician.  Recommendations may be updated based on patient status, additional functional criteria and insurance authorization.    Follow Up Recommendations  Home health OT    Assistance Recommended at Discharge Frequent or constant Supervision/Assistance  Patient can return home with the following  A lot of help with bathing/dressing/bathroom;A lot of help with walking and/or transfers   Equipment Recommendations  BSC/3in1;Wheelchair (measurements OT);Wheelchair cushion (measurements OT);Hospital bed;Other (comment) (RW)    Recommendations for Other Services Other (comment) (medication management review for pain meds oral)    Precautions / Restrictions Precautions Precautions: Fall Required Braces or Orthoses: Other Brace Other Brace: Darco shoe R  LE Restrictions Weight Bearing Restrictions: Yes RLE Weight Bearing: Partial weight bearing RLE Partial Weight Bearing Percentage or Pounds: heel weight bearing       Mobility Bed Mobility Overal bed mobility: Needs Assistance Bed Mobility: Rolling;Supine to Sit Rolling: Min guard Sidelying to sit: Min guard       General bed mobility comments: pt pulling up with bed rail and long sitting without any assistance    Transfers Overall transfer level: Needs assistance   Transfers: Bed to chair/wheelchair/BSC       Squat pivot transfers: Min guard     General transfer comment: pt drowzy question due to medication but able to complete task with min request     Balance Overall balance assessment: Needs assistance   Sitting balance-Leahy Scale: Good         Standing balance comment: does not achieve full sit <S>tand with RW                           ADL either performed or assessed with clinical judgement   ADL Overall ADL's : Needs assistance/impaired Eating/Feeding: Set up   Grooming: Wash/dry hands;Wash/dry face;Oral care;Sitting;Set up   Upper Body Bathing: Set up;Sitting   Lower Body Bathing: Set up;Sitting/lateral leans   Upper Body Dressing : Set up   Lower Body Dressing: Total assistance     Toilet Transfer Details (indicate cue type and reason): incontinence of bowel on arrival in the bed and not calling for (A).           General ADL Comments: pt transfered squat pivot to the L  side into drop arm chair then completed full bathing task at sink. Pt lateral leaning and cleaning peri area multiple times due to stool. Pt able to complete  lateral lean for OT to change pad. pt does report feeling better after bathing. Pt compelted task with OT handing wash cloth to the patient only due to visual deficits    Extremity/Trunk Assessment Upper Extremity Assessment Upper Extremity Assessment: Overall WFL for tasks assessed   Lower Extremity  Assessment Lower Extremity Assessment: Defer to PT evaluation;RLE deficits/detail RLE Deficits / Details: dressing wrapped and intact        Vision       Perception     Praxis      Cognition Arousal/Alertness: Awake/alert Behavior During Therapy: Flat affect Overall Cognitive Status: Impaired/Different from baseline Area of Impairment: Orientation;Attention;Memory;Following commands;Safety/judgement;Awareness;Problem solving                 Orientation Level: Disoriented to;Time;Situation Current Attention Level: Sustained Memory: Decreased short-term memory Following Commands: Follows one step commands with increased time Safety/Judgement: Decreased awareness of safety;Decreased awareness of deficits Awareness: Intellectual Problem Solving: Slow processing General Comments: pt verbalizes "i can't move" pt in fact sits up on the eob when saying "i cant move" Pt reports pain but without any signs of pain in that moment ( HR stable, no facial grimace, not reaching for any location, eyes closed and only verbal", OT making a requesting and waiting for response. after a prolonged wait pt does initiate the task asked and proceeds to sink level bathing. pt made same "pain" statements then terminated them the entire session once engaged in task without any deficits noted in ability to complete task. pt does seem drowzy and noted to get IV pain medications          Exercises     Shoulder Instructions       General Comments VSS    Pertinent Vitals/ Pain       Pain Assessment: 0-10 Pain Score: 8  Pain Location: generalize doesnt say did say "i can't take this once" Pain Descriptors / Indicators: Sore Pain Intervention(s): Monitored during session;Repositioned;RN gave pain meds during session  Home Living                                          Prior Functioning/Environment              Frequency  Min 2X/week        Progress Toward  Goals  OT Goals(current goals can now be found in the care plan section)  Progress towards OT goals: Progressing toward goals  Acute Rehab OT Goals Patient Stated Goal: to get pain medications OT Goal Formulation: With patient Time For Goal Achievement: 08/30/21 Potential to Achieve Goals: Good ADL Goals Pt Will Perform Grooming: sitting;with modified independence Pt Will Perform Upper Body Bathing: sitting;with modified independence Pt Will Perform Lower Body Bathing: sitting/lateral leans;with modified independence Pt Will Perform Upper Body Dressing: sitting;with modified independence Pt Will Perform Lower Body Dressing: sitting/lateral leans;with caregiver independent in assisting;with min guard assist Pt Will Transfer to Toilet: stand pivot transfer;bedside commode;with modified independence Pt Will Perform Toileting - Clothing Manipulation and hygiene: sitting/lateral leans;with caregiver independent in assisting;with modified independence  Plan Discharge plan remains appropriate    Co-evaluation                 AM-PAC OT "6 Clicks" Daily Activity     Outcome Measure   Help from another person eating meals?: None Help from another person taking care of personal grooming?:  None Help from another person toileting, which includes using toliet, bedpan, or urinal?: A Little Help from another person bathing (including washing, rinsing, drying)?: A Little Help from another person to put on and taking off regular upper body clothing?: None Help from another person to put on and taking off regular lower body clothing?: A Little 6 Click Score: 21    End of Session    OT Visit Diagnosis: Other abnormalities of gait and mobility (R26.89) Pain - Right/Left: Right Pain - part of body: Ankle and joints of foot   Activity Tolerance Patient tolerated treatment well   Patient Left in chair;with call bell/phone within reach;with chair alarm set   Nurse Communication Mobility  status;Patient requests pain meds;Precautions        Time: 1127-1203 OT Time Calculation (min): 36 min  Charges: OT General Charges $OT Visit: 1 Visit OT Treatments $Self Care/Home Management : 23-37 mins   Brynn, OTR/L  Acute Rehabilitation Services Pager: [phone] Office: [phone] .   Jeri Modena 08/23/2021, 1:57 PM

## 2021-08-23 NOTE — Progress Notes (Signed)
HOSPITAL MEDICINE OVERNIGHT EVENT NOTE    Notified by nursing that patient has been experiencing several hours of recurrent hypoglycemia with blood sugars ranging between 59 and 65.  This is felt to have developed due to the fact that the patient skipped dinner.  Patient was provided with a late meal as well as drinking 2 cups of juice before blood sugars began to rise to 169 at 11:26 PM.  Patient has been encouraged to regularly eat her meals as scheduled.  We will slightly reduce evening basal dose of insulin to 12 units nightly.  We will obtain repeat blood glucose at 2 AM.  Vernelle Emerald  MD [facility]

## 2021-08-23 NOTE — Progress Notes (Signed)
Patient ID: [name], female   DOB: 04-26-1981, 41 y.o.   MRN: [id number]  PROGRESS NOTE    [name]  HEN:[id number] DOB: 06-19-1981 DOA: 08/10/2021 PCP: [facility]   Brief Narrative:  41 year old female with past medical history significant for diabetes type 2 on insulin, ESRD on hemodialysis TTS schedule, neuropathy, hypertension, poor compliance presented to the ED with right foot pain and infection.  She was found to have gangrenous toes.  She underwent angiogram and urgent transmetatarsal amputation.  Vascular surgery and nephrology following.  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  Vascular surgery following and currently recommending no surgical intervention with outpatient follow-up with vascular surgery to determine if she would need any further proximal amputation.  Wound care as per vascular surgery.  Wound VAC was removed on 08/18/2021 -Currently on IV Ancef.  We will follow-up with vascular surgery regarding duration of antibiotic therapy.  Wound culture was positive for MSSA.  End-stage renal disease on hemodialysis -Nephrology following.  Dialysis as per nephrology schedule  Anemia of chronic disease -Status post 2 units packed red cell transfusion on 08/12/2021.  Hemoglobin currently stable.  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  Continue Lantus along with CBGs with SSI.  Hypertension Continue losartan  Leukocytosis -Improving  Hyponatremia -Being managed by hemodialysis  Hypoalbuminemia -Follow nutrition recommendations    DVT prophylaxis: Heparin Code Status: Full Family Communication: None at bedside Disposition Plan: Status is: Inpatient  Remains inpatient appropriate because: Patient does not feel well enough for discharge  today.  Consultants: Vascular surgery/nephrology  Procedures: Open right foot amputation on 08/11/2021.  Revision of right foot TMA/placement of wound VAC on 08/16/2021  Antimicrobials:  Anti-infectives (From admission, onward)    Start     Dose/Rate Route Frequency Ordered Stop   08/17/21 2200  ciprofloxacin (CIPRO) tablet 500 mg  Status:  Discontinued        500 mg Oral Daily at bedtime 08/17/21 0928 08/17/21 1330   08/17/21 1800  ceFAZolin (ANCEF) IVPB 2g/100 mL premix        2 g 200 mL/hr over 30 Minutes Intravenous Every T-Th-Sa (1800) 08/17/21 1303 09/26/21 2359   08/12/21 1200  vancomycin (VANCOREADY) IVPB 750 mg/150 mL  Status:  Discontinued        750 mg 150 mL/hr over 60 Minutes Intravenous Every T-Th-Sa (Hemodialysis) 08/11/21 0238 08/17/21 1303   08/11/21 2200  ciprofloxacin (CIPRO) IVPB 400 mg  Status:  Discontinued       Note to Pharmacy: Cipro 400 mg IV q24h for Cr Cl < 30 mL/min   400 mg 200 mL/hr over 60 Minutes Intravenous Every 24 hours 08/11/21 0321 08/17/21 0928   08/11/21 1000  ciprofloxacin (CIPRO) IVPB 400 mg  Status:  Discontinued        400 mg 200 mL/hr over 60 Minutes Intravenous Every 12 hours 08/11/21 0316 08/11/21 0321   08/11/21 0600  clindamycin (CLEOCIN) IVPB 600 mg  Status:  Discontinued        600 mg 100 mL/hr over 30 Minutes Intravenous Every 8 hours 08/11/21 0316 08/14/21 0912   08/11/21 0100  vancomycin (VANCOREADY) IVPB 1500 mg/300 mL        1,500 mg 150 mL/hr over 120 Minutes Intravenous  Once 08/11/21 0014 08/11/21 0404   08/11/21 0015  ciprofloxacin (CIPRO) IVPB  400 mg        400 mg 200 mL/hr over 60 Minutes Intravenous  Once 08/11/21 0014 08/11/21 0202   08/11/21 0015  clindamycin (CLEOCIN) IVPB 600 mg        600 mg 100 mL/hr over 30 Minutes Intravenous  Once 08/11/21 0014 08/11/21 0057        Subjective: Patient seen and examined at bedside.  Does not feel well enough to go home today; feels very weak.  Denies worsening fever,  nausea, vomiting.  Objective: Vitals:   08/22/21 2300 08/23/21 0305 08/23/21 0500 08/23/21 0819  BP: 132/61 (!) 145/43  (!) 141/65  Pulse: 83 90  90  Resp: 20 18  20   Temp: 98.6 F (37 C) 98.3 F (36.8 C)    TempSrc: Oral Oral    SpO2: 100% 98%  97%  Weight:   65.2 kg   Height:        Intake/Output Summary (Last 24 hours) at 08/23/2021 1127 Last data filed at 08/22/2021 1350 Gross per 24 hour  Intake 240 ml  Output 1478 ml  Net -1238 ml   Filed Weights   08/22/21 0831 08/22/21 1240 08/23/21 0500  Weight: 63.2 kg 60.8 kg 65.2 kg    Examination:  General exam: Appears calm and comfortable.  Currently on room air.  Looks chronically ill and deconditioned. Respiratory system: Bilateral decreased breath sounds at bases with scattered crackles Cardiovascular system: S1 & S2 heard, Rate controlled Gastrointestinal system: Abdomen is nondistended, soft and nontender. Normal bowel sounds heard. Extremities: No cyanosis, clubbing; right TMA present with dressing Central nervous system: Awake, slow to respond.  Poor historian.  No focal neurological deficits. Moving extremities Skin: No ecchymosis/other lesions.   Psychiatry: Affect is mostly flat.   Data Reviewed: I have personally reviewed following labs and imaging studies  CBC: Recent Labs  Lab 08/19/21 1750 08/20/21 0845 08/22/21 0952 08/23/21 0651  WBC 12.6* 14.9* 12.6* 11.2*  HGB 8.6* 9.4* 8.3* 8.5*  HCT 27.5* 30.1* 27.4* 29.0*  MCV 91.1 90.1 90.4 91.5  PLT 210 216 297 220   Basic Metabolic Panel: Recent Labs  Lab 08/17/21 0901 08/18/21 0500 08/19/21 1750 08/20/21 0845 08/22/21 0952  NA 134* 134* 133* 133* 136  K 4.0 4.3 4.2 3.9 4.9  CL 99 97* 96* 95* 98  CO2 23 25 22 22  21*  GLUCOSE 146* 247* 186* 111* 125*  BUN 42* 27* 47* 33* 51*  CREATININE 10.25* 8.17* 11.63* 8.81* 12.97*  CALCIUM 9.7 9.3 8.5* 9.3 9.1  PHOS 8.2* 6.8* 9.6* 7.6* 7.9*   GFR: Estimated Creatinine Clearance: 5 mL/min (A) (by C-G  formula based on SCr of 12.97 mg/dL (H)). Liver Function Tests: Recent Labs  Lab 08/17/21 0901 08/18/21 0500 08/19/21 1750 08/20/21 0845 08/22/21 0952  ALBUMIN 1.9* 2.0* 1.7* 2.1* 1.8*   No results for input(s): LIPASE, AMYLASE in the last 168 hours. No results for input(s): AMMONIA in the last 168 hours. Coagulation Profile: No results for input(s): INR, PROTIME in the last 168 hours. Cardiac Enzymes: No results for input(s): CKTOTAL, CKMB, CKMBINDEX, TROPONINI in the last 168 hours. BNP (last 3 results) No results for input(s): PROBNP in the last 8760 hours. HbA1C: No results for input(s): HGBA1C in the last 72 hours. CBG: Recent Labs  Lab 08/22/21 1332 08/22/21 1630 08/22/21 2156 08/23/21 0620 08/23/21 1116  GLUCAP 101* 181* 148* 116* 151*   Lipid Profile: No results for input(s): CHOL, HDL, LDLCALC, TRIG, CHOLHDL, LDLDIRECT in the last 72  hours. Thyroid Function Tests: No results for input(s): TSH, T4TOTAL, FREET4, T3FREE, THYROIDAB in the last 72 hours. Anemia Panel: No results for input(s): VITAMINB12, FOLATE, FERRITIN, TIBC, IRON, RETICCTPCT in the last 72 hours. Sepsis Labs: No results for input(s): PROCALCITON, LATICACIDVEN in the last 168 hours.  Recent Results (from the past 240 hour(s))  Surgical pcr screen     Status: None   Collection Time: 08/16/21  4:38 AM   Specimen: Nasal Mucosa; Nasal Swab  Result Value Ref Range Status   MRSA, PCR NEGATIVE NEGATIVE Final   Staphylococcus aureus NEGATIVE NEGATIVE Final    Comment: (NOTE) The Xpert SA Assay (FDA approved for NASAL specimens in patients 43 years of age and older), is one component of a comprehensive surveillance program. It is not intended to diagnose infection nor to guide or monitor treatment. Performed at [facility], [place] [address] [place], [place] [address]          Radiology Studies: No results found.      Scheduled Meds:  sodium chloride   Intravenous Once    Chlorhexidine Gluconate Cloth  6 each Topical Q0600   Chlorhexidine Gluconate Cloth  6 each Topical Q0600   cinacalcet  90 mg Oral Daily   darbepoetin (ARANESP) injection - DIALYSIS  150 mcg Intravenous Q Tue-HD   feeding supplement  1 Container Oral BID BM   heparin  5,000 Units Subcutaneous Q8H   insulin aspart  0-6 Units Subcutaneous TID WC & HS   insulin glargine-yfgn  15 Units Subcutaneous QHS   LORazepam  1 mg Intravenous Once   losartan  50 mg Oral Daily   multivitamin  1 tablet Oral QHS   sevelamer carbonate  1.6 g Oral TID WC   sodium chloride flush  3 mL Intravenous Q12H   sodium chloride flush  3 mL Intravenous Q12H   Continuous Infusions:  sodium chloride     sodium chloride 500 mL (08/14/21 2116)    ceFAZolin (ANCEF) IV 2 g (08/22/21 1716)          Aline August, MD [facility] 08/23/2021, 11:27 AM

## 2021-08-23 NOTE — Progress Notes (Signed)
Contacted by medical staff regarding pt's  possible d/c tomorrow. Pt is due for HD tomorrow. Inquired of treatment team if it would be possible for pt to d/c in the am in order to receive HD at out-pt clinic. Contacted [place]. Clinic is able to provide pt's treatment tomorrow as long as pt arrives by 10:30. Medical team made aware of this info. Met with pt at bedside this afternoon. Pt sitting in chair. Inquired of pt if she would have transportation to HD clinic in the am if MD d/c pt in morning in order to get to clinic by 10:30. Pt reports that pt's sister would be able to provide transport from hospital to clinic. Contacted inpt HD unit to request that pt be placed on 2nd shift in the event pt not d/c in the am. Will assist as needed.   Melven Sartorius Renal Navigator [phone]

## 2021-08-23 NOTE — TOC Progression Note (Signed)
Transition of Care (TOC) - Progression Note  Tracey Gibbons RN, BSN Transitions of Care Unit 4E- RN Case Manager See Treatment Team for direct phone #    Patient Details  Name: [name] MRN: [id number] Date of Birth: 03-17-1981  Transition of Care [facility]) CM/SW Contact  Dahlia Client, Romeo Rabon, RN Phone Number: 08/23/2021, 3:43 PM  Clinical Narrative:    Notified plan for pt to potentially d/c in am, per renal navigator pt will need to d/c by 1030 in order to get to her outpt HD clinic for her HD tx tomorrow. Pt reports she will have a ride available to get her to her outpt clinic in the am.   Cm spoke with pt at bedside to f/u on transition needs for [facility] and DME. Pt had been provided a list for [facility] choice previously- reviewed list with pt and per pt she does not have a preference for [facility] agency- defers to this writer to secure on her behalf- discussed using [facility] as they have protocols in place with Vascular service- pt agreeable to this.  Pt will not be going home with wound VAC- currently doing daily drsg changes.  Also reviewed DME needs- pt confirms she needs BSC/3n1 and RW for home.   Call made to [employer] for DME need- 3n1 and RW to be delivered to room prior to discharge for pt to take with her home.   Call made to Amy with [facility] for [facility] referral- RN/PT/OT needs- referral has been accepted and due to HD schedule TTS- plan will be for [facility] to do start of care on Friday 1/13- they will reach out to patient to schedule.    Expected Discharge Plan: [place] Barriers to Discharge: Barriers Resolved  Expected Discharge Plan and Services Expected Discharge Plan: [place]   Discharge Planning Services: CM Consult Post Acute Care Choice: Durable Medical Equipment, Home Health Living arrangements for the past 2 months: Apartment                 DME Arranged: 3-N-1, Walker rolling DME Agency: [facility] Date DME Agency Contacted: 08/23/21 Time  DME Agency Contacted: [phone] Representative spoke with at DME Agency: Freda Munro HH Arranged: RN, OT, PT [facility] Agency: [place] Date [place]: 08/23/21 Time [place]: 1542 Representative spoke with at [place]: [place]) Interventions    Readmission Risk Interventions Readmission Risk Prevention Plan 08/23/2021  Transportation Screening Complete  HRI or [place] Complete  Social Work Consult for [place] Planning/Counseling [place] Not Applicable  Medication Review Press photographer) Complete  Some recent data might be hidden

## 2021-08-23 NOTE — Progress Notes (Addendum)
°  Progress Note    08/23/2021 7:53 AM 7 Days Post-Op  Subjective:  No complaints   Vitals:   08/22/21 2300 08/23/21 0305  BP: 132/61 (!) 145/43  Pulse: 83 90  Resp: 20 18  Temp: 98.6 F (37 C) 98.3 F (36.8 C)  SpO2: 100% 98%   Physical Exam: Lungs:  non labored Incisions:  R TMA dressing left in place Abdomen:  soft, NT Neurologic: a&O  CBC    Component Value Date/Time   WBC 11.2 (H) 08/23/2021 0651   RBC 3.17 (L) 08/23/2021 0651   HGB 8.5 (L) 08/23/2021 0651   HGB 10.6 (L) 05/18/2020 0927   HCT 29.0 (L) 08/23/2021 0651   HCT 35.1 05/18/2020 0927   PLT 266 08/23/2021 0651   PLT 186 05/18/2020 0927   MCV 91.5 08/23/2021 0651   MCV 89 05/18/2020 0927   MCH 26.8 08/23/2021 0651   MCHC 29.3 (L) 08/23/2021 0651   RDW 16.3 (H) 08/23/2021 0651   RDW 13.9 05/18/2020 0927   LYMPHSABS 1.6 08/14/2021 0445   LYMPHSABS 1.6 05/18/2020 0927   MONOABS 0.9 08/14/2021 0445   EOSABS 0.6 (H) 08/14/2021 0445   EOSABS 0.6 (H) 05/18/2020 0927   BASOSABS 0.0 08/14/2021 0445   BASOSABS 0.0 05/18/2020 0927    BMET    Component Value Date/Time   NA 136 08/22/2021 0952   NA 133 (L) 05/18/2020 0927   K 4.9 08/22/2021 0952   CL 98 08/22/2021 0952   CO2 21 (L) 08/22/2021 0952   GLUCOSE 125 (H) 08/22/2021 0952   BUN 51 (H) 08/22/2021 0952   BUN 45 (H) 05/18/2020 0927   CREATININE 12.97 (H) 08/22/2021 0952   CALCIUM 9.1 08/22/2021 0952   GFRNONAA 3 (L) 08/22/2021 0952   GFRAA 6 (L) 05/18/2020 0927    INR    Component Value Date/Time   INR 1.2 08/10/2021 2212     Intake/Output Summary (Last 24 hours) at 08/23/2021 0753 Last data filed at 08/22/2021 1350 Gross per 24 hour  Intake 240 ml  Output 1478 ml  Net -1238 ml     Assessment/Plan:  41 y.o. female is s/p R TMA 7 Days Post-Op   Continue daily dressing changes R TMA Plan is to monitor for now; no indication for more proximal amputation currently [facility] for discharge home with [facility] Office will arrange follow up in  about 1 week to monitor progress of TMA; patient is aware she is at risk for more proximal amputation   Dagoberto Ligas, PA-C [facility] [phone] 08/23/2021 7:53 AM   I agree with the above.  I have seen and evaluated the patient.  She remains at high risk for conversion to below-knee amputation however her transmetatarsal amputation site does not show overt signs of nonhealing.  The patient would like to try and salvage this.  I think this is reasonable.  I would continue with daily evaluations.  She should remain on IV antibiotics until this declares itself.  [name]

## 2021-08-24 DIAGNOSIS — N186 End stage renal disease: Secondary | ICD-10-CM | POA: Diagnosis not present

## 2021-08-24 DIAGNOSIS — Z992 Dependence on renal dialysis: Secondary | ICD-10-CM | POA: Diagnosis not present

## 2021-08-24 DIAGNOSIS — I96 Gangrene, not elsewhere classified: Secondary | ICD-10-CM | POA: Diagnosis not present

## 2021-08-24 LAB — RENAL FUNCTION PANEL
Albumin: 1.7 g/dL — ABNORMAL LOW (ref 3.5–5.0)
Anion gap: 14 (ref 5–15)
BUN: 31 mg/dL — ABNORMAL HIGH (ref 6–20)
CO2: 26 mmol/L (ref 22–32)
Calcium: 9.2 mg/dL (ref 8.9–10.3)
Chloride: 100 mmol/L (ref 98–111)
Creatinine, Ser: 10.6 mg/dL — ABNORMAL HIGH (ref 0.44–1.00)
GFR, Estimated: 4 mL/min — ABNORMAL LOW (ref >60)
Glucose, Bld: 64 mg/dL — ABNORMAL LOW (ref 70–99)
Phosphorus: 7.1 mg/dL — ABNORMAL HIGH (ref 2.5–4.6)
Potassium: 4.6 mmol/L (ref 3.5–5.1)
Sodium: 140 mmol/L (ref 135–145)

## 2021-08-24 LAB — GLUCOSE, CAPILLARY
Glucose-Capillary: 100 mg/dL — ABNORMAL HIGH (ref 70–99)
Glucose-Capillary: 93 mg/dL (ref 70–99)
Glucose-Capillary: 50 mg/dL — ABNORMAL LOW (ref 70–99)
Glucose-Capillary: 143 mg/dL — ABNORMAL HIGH (ref 70–99)
Glucose-Capillary: 139 mg/dL — ABNORMAL HIGH (ref 70–99)

## 2021-08-24 LAB — CBC
HCT: 25.7 % — ABNORMAL LOW (ref 36.0–46.0)
Hemoglobin: 8 g/dL — ABNORMAL LOW (ref 12.0–15.0)
MCH: 28.1 pg (ref 26.0–34.0)
MCHC: 31.1 g/dL (ref 30.0–36.0)
MCV: 90.2 fL (ref 80.0–100.0)
Platelets: 294 10*3/uL (ref 150–400)
RBC: 2.85 MIL/uL — ABNORMAL LOW (ref 3.87–5.11)
RDW: 16.2 % — ABNORMAL HIGH (ref 11.5–15.5)
WBC: 13.1 10*3/uL — ABNORMAL HIGH (ref 4.0–10.5)
nRBC: 0 % (ref 0.0–0.2)

## 2021-08-24 LAB — ZINC: Zinc: 79 ug/dL (ref 44–115)

## 2021-08-24 MED ORDER — SEVELAMER CARBONATE 0.8 G PO PACK: 1.6000 g | PACK | Freq: Three times a day (TID) | ORAL | 0 refills | Status: DC

## 2021-08-24 MED ORDER — CEFAZOLIN IV (FOR PTA / DISCHARGE USE ONLY): 2.0000 g | INTRAVENOUS | 0 refills | Status: DC

## 2021-08-24 MED ORDER — LANTUS SOLOSTAR 100 UNIT/ML ~~LOC~~ SOPN: 7.0000 [IU] | PEN_INJECTOR | Freq: Every day | SUBCUTANEOUS | Status: DC

## 2021-08-24 NOTE — Progress Notes (Addendum)
HOSPITAL MEDICINE OVERNIGHT EVENT NOTE    Nursing reports that blood sugar was 50 at 6:38 PM this evening and is inquiring as to whether she should proceed with giving 12 units of basal insulin this evening.  We will proceed with giving 12 units of basal insulin and recheck blood sugar at 2 AM to ensure there is no evidence of early morning hypoglycemia.  Tracey Emerald  MD [facility]   ADDENDUM (1/13 6:30am)  BS at 607AM 69.  Orange juice given to the patient.  Day team may want to consider a slight reduction of patients evening basal insulin dose.   [name]

## 2021-08-24 NOTE — Progress Notes (Signed)
Sister: Fidel Levy [phone]

## 2021-08-24 NOTE — Progress Notes (Signed)
Hypoglycemic Event  CBG: 50   Treatment: 8 oz juice/soda  Symptoms: None  Follow-up CBG: Time:1946 CBG Result:143  Possible Reasons for Event: Inadequate meal intake  Comments/MD notified:    Danne Harbor

## 2021-08-24 NOTE — Progress Notes (Signed)
Spent some time w/ pt; we left a vm for her husband. She states he is in [place] till Saturday.  We also left a message for her sister [name]? - left VM stating pt was ready to be discharged; dialysis this afternoon; and we would like her to call unit to ensure she will be supporting the pt at home.--JM

## 2021-08-24 NOTE — Progress Notes (Addendum)
°  Progress Note    08/24/2021 9:14 AM 8 Days Post-Op  Subjective:  Pain improved R TMA   Vitals:   08/24/21 0624 08/24/21 0755  BP: 134/60 (!) 132/33  Pulse: 89 90  Resp: 18 20  Temp: 98.6 F (37 C) 98.6 F (37 C)  SpO2: 100% 100%   Physical Exam: Lungs:  non labored Incisions:  Dressing already changed this morning.  She deferred exam of TMA Neurologic: A&O  CBC    Component Value Date/Time   WBC 11.2 (H) 08/23/2021 0651   RBC 3.17 (L) 08/23/2021 0651   HGB 8.5 (L) 08/23/2021 0651   HGB 10.6 (L) 05/18/2020 0927   HCT 29.0 (L) 08/23/2021 0651   HCT 35.1 05/18/2020 0927   PLT 266 08/23/2021 0651   PLT 186 05/18/2020 0927   MCV 91.5 08/23/2021 0651   MCV 89 05/18/2020 0927   MCH 26.8 08/23/2021 0651   MCHC 29.3 (L) 08/23/2021 0651   RDW 16.3 (H) 08/23/2021 0651   RDW 13.9 05/18/2020 0927   LYMPHSABS 1.6 08/14/2021 0445   LYMPHSABS 1.6 05/18/2020 0927   MONOABS 0.9 08/14/2021 0445   EOSABS 0.6 (H) 08/14/2021 0445   EOSABS 0.6 (H) 05/18/2020 0927   BASOSABS 0.0 08/14/2021 0445   BASOSABS 0.0 05/18/2020 0927    BMET    Component Value Date/Time   NA 136 08/22/2021 0952   NA 133 (L) 05/18/2020 0927   K 4.9 08/22/2021 0952   CL 98 08/22/2021 0952   CO2 21 (L) 08/22/2021 0952   GLUCOSE 125 (H) 08/22/2021 0952   BUN 51 (H) 08/22/2021 0952   BUN 45 (H) 05/18/2020 0927   CREATININE 12.97 (H) 08/22/2021 0952   CALCIUM 9.1 08/22/2021 0952   GFRNONAA 3 (L) 08/22/2021 0952   GFRAA 6 (L) 05/18/2020 0927    INR    Component Value Date/Time   INR 1.2 08/10/2021 2212    No intake or output data in the 24 hours ending 08/24/21 0914   Assessment/Plan:  41 y.o. female is s/p TMA 8 Days Post-Op   Dressing changed this morning prior to my exam; subjectively foot is without pain today Plans noted for HD today She will continue ancef for 6 weeks with HD Ok for discharge home from vascular standpoint; office will arrange follow up in 7-10 days.  She is aware of  her risk for more proximal amputation   Dagoberto Ligas, PA-C [facility] [phone] 08/24/2021 9:14 AM   I agree with the above.  Anticipate d/c after HD today with close follow up.  Discussed ongoing risk for conversion to BKA.  Continue abx  [name]

## 2021-08-24 NOTE — Discharge Summary (Addendum)
Physician Discharge Summary  [name] FUX:[id number] DOB: November 21, 1980 DOA: 08/10/2021  PCP: [facility]  Admit date: 08/10/2021 Discharge date: 08/24/2021  Admitted From: Home Disposition: Home  Recommendations for Outpatient Follow-up:  Follow up with PCP in 1 week  On present follow-up with vascular surgery.  Wound care as per vascular surgery recommendations Outpatient follow-up with hemodialysis unit as scheduled Follow up in ED if symptoms worsen or new appear   Home Health: PT/OT/RN Equipment/Devices: None  Discharge Condition: Stable CODE STATUS: Full Diet recommendation: Heart healthy/carb modified/renal hemodialysis diet  Brief/Interim Summary: 41 year old female with past medical history significant for diabetes type 2 on insulin, ESRD on hemodialysis TTS schedule, neuropathy, hypertension, poor compliance presented to the ED with right foot pain and infection.  She was found to have gangrenous toes.  She underwent angiogram and urgent transmetatarsal amputation.  Vascular surgery and nephrology following.  She subsequently underwent further surgical intervention by vascular surgery with wound VAC placement and subsequent removal of wound VAC on 08/18/2021.  Antibiotics have been changed to a IV Ancef with wound cultures growing MSSA.  Vascular surgery has cleared the patient for discharge with close outpatient follow-up with vascular surgery for need for possible further proximal amputation.  She will be discharged home today with outpatient follow-up with PCP/vascular surgery/nephrology.  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  Vascular surgery following and currently recommending no surgical intervention with outpatient follow-up with vascular surgery to determine if she would need any further proximal amputation.  Wound care as per vascular  surgery.  Wound VAC was removed on 08/18/2021 -Currently on IV Ancef. Wound culture was positive for MSSA. -Vascular surgery recommends 6 weeks of IV Ancef.  We will plan on IV Ancef till 09/22/2021 (6 weeks from first surgical intervention) -Vascular surgery cleared the patient for discharge with outpatient follow-up with vascular surgery.  Discharge patient home today.   End-stage renal disease on hemodialysis -Nephrology following.  Dialysis as per nephrology schedule.  Follow-up with outpatient hemodialysis unit as scheduled.   Anemia of chronic disease -Status post 2 units packed red cell transfusion on 08/12/2021.  Hemoglobin currently stable.   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  DC long acting insulin for now. Carb modified diet.  Outpatient follow-up.   Hypertension -Continue losartan  Leukocytosis -Improving.  Outpatient follow-up.  Hyponatremia -Improved.  Being managed by hemodialysis.    Hypoalbuminemia -Follow nutrition recommendations   Discharge Instructions  Discharge Instructions     [facility] pharmacist to adjust dose for Vancomycin, Aminoglycosides and other anti-infective therapies as requested by physician.   Complete by: As directed    [facility] to provide Cath Flo 30m   Complete by: As directed    Administer for PICC line occlusion and as ordered by physician for other access device issues.   Anaphylaxis Kit: Provided to treat any anaphylactic reaction to the medication being provided to the patient if First Dose or when requested by physician   Complete by: As directed    Epinephrine 112mml vial / amp: Administer 0.77m86m0.77ml70mubcutaneously once for moderate to severe anaphylaxis, nurse to call physician and pharmacy when reaction occurs and call 911 if needed for immediate care   Diphenhydramine 50mg29mIV vial: Administer 25-50mg 107mM  PRN for first dose reaction, rash, itching, mild reaction, nurse  to call physician and pharmacy when reaction occurs   Sodium Chloride 0.9% NS 570m IV: Administer if needed for hypovolemic blood pressure drop or as ordered by physician after call to physician with anaphylactic reaction   Change dressing on IV access line weekly and PRN   Complete by: As directed    Diet - low sodium heart healthy   Complete by: As directed    Discharge wound care:   Complete by: As directed    As per vascular surgery recommendations   Flush IV access with Sodium Chloride 0.9% and Heparin 10 units/ml or 100 units/ml   Complete by: As directed    Home infusion instructions - [facility]   Complete by: As directed    Instructions: Flush IV access with Sodium Chloride 0.9% and Heparin 10units/ml or 100units/ml   Change dressing on IV access line: Weekly and PRN   Instructions Cath Flo 21m Administer for PICC Line occlusion and as ordered by physician for other access device   [facility] pharmacist to adjust dose for: Vancomycin, Aminoglycosides and other anti-infective therapies as requested by physician   Increase activity slowly   Complete by: As directed    Method of administration may be changed at the discretion of home infusion pharmacist based upon assessment of the patient and/or caregivers ability to self-administer the medication ordered   Complete by: As directed       Allergies as of 08/25/2021       Reactions   Morphine Rash, Other (See Comments)   Peanut-containing Drug Products Anaphylaxis, Hives   Penicillins Rash   Rash in 2008.  Tolerated cefazolin in 2020   Chocolate Hives        Medication List     STOP taking these medications    Fosrenol 1000 MG Pack Generic drug: Lanthanum Carbonate   Lantus SoloStar 100 UNIT/ML Solostar Pen Generic drug: insulin glargine   NovoLOG FlexPen 100 UNIT/ML FlexPen Generic drug: insulin aspart       TAKE these  medications    Accu-Chek FastClix Lancets Misc Use as instructed to check blood sugar up to TID. E11.22 What changed:  how much to take how to take this when to take this   Accu-Chek Guide Me w/Device Kit 1 kit by Does not apply route 3 (three) times daily. Use to check BG at home up to 3 times daily. E11.22   Accu-Chek Guide test strip Generic drug: glucose blood Use as instructed to check blood sugar up to TID. E11.22 What changed:  how much to take how to take this when to take this   calcitRIOL 0.25 MCG capsule Commonly known as: ROCALTROL Take 0.25 mcg by mouth 2 (two) times daily.   ceFAZolin  IVPB Commonly known as: ANCEF Inject 2 g into the vein Every Tuesday,Thursday,and Saturday with dialysis. Indication:  osteomyelitis First Dose: No Last Day of Therapy:  09/21/21 with dialysis Labs - Once weekly:  CBC/D and BMP, Labs - Every other week:  ESR and CRP Method of administration: IV Push Method of administration may be changed at the discretion of home infusion pharmacist based upon assessment of the patient and/or caregiver's ability to self-administer the medication ordered.   cinacalcet 90 MG tablet Commonly known as: SENSIPAR Take 90 mg by mouth daily.   Dexcom G6 Receiver Devi 1 Device by Does not apply route as directed.   Dexcom G6 Sensor Misc 1 Device by Does not apply route as directed.   Dexcom  G6 Transmitter Misc 1 Device by Does not apply route as directed.   dorzolamide-timolol 22.3-6.8 MG/ML ophthalmic solution Commonly known as: COSOPT Place 1 drop into the left eye 2 (two) times daily.   HAIR/SKIN/NAILS PO Take 1 capsule by mouth daily.   Insulin Pen Needle 32G X 4 MM Misc 1 Device by Does not apply route as directed.   lidocaine-prilocaine cream Commonly known as: EMLA Apply 1 application topically as directed.   losartan 50 MG tablet Commonly known as: COZAAR Take 1 tablet (50 mg total) by mouth daily.   multivitamin Tabs  tablet Take 1 tablet by mouth daily.   sevelamer carbonate 0.8 g Pack packet Commonly known as: RENVELA Take 1.6 g by mouth 3 (three) times daily with meals. What changed:  medication strength how much to take               Durable Medical Equipment  (From admission, onward)           Start     Ordered   08/23/21 1527  For home use only DME Walker rolling  Once       Question Answer Comment  Walker: With Barry Wheels   Patient needs a walker to treat with the following condition Weakness generalized      08/23/21 1526   08/23/21 1526  For home use only DME 3 n 1  Once        08/23/21 1526              Discharge Care Instructions  (From admission, onward)           Start     Ordered   08/24/21 0000  Change dressing on IV access line weekly and PRN  (Home infusion instructions - [facility] )        08/24/21 1135   08/24/21 0000  Discharge wound care:       Comments: As per vascular surgery recommendations   08/24/21 1135              Follow-up Information     Shamleffer, Melanie Crazier, MD Follow up.   Specialties: Endocrinology, Radiology Why: diabetes control, please discuss with your endocrinologist regarding continuous glucose monitoring device A1c goal if less than 7% Contact information: [place] [address] [place], [facility] Follow up.   Specialty: Home Health Services Why: (now known as [place])- HHRN/PT/OT arranged- they will contact you to schedule initial home visit- plan for start of care on Friday 1/13 Contact information: [place] [address] [phone]         [facility] Follow up.   Why: rolling walker and 3n1 arranged- to be delivered to room prior to discharge Contact information: [place] [address] [phone]         [facility]. Schedule an appointment as  soon as possible for a visit in 1 week(s).   Specialty: Pediatrics Contact information: [place] [address] [phone]         Serafina Mitchell, MD. Schedule an appointment as soon as possible for a visit in 1 week(s).   Specialties: Vascular Surgery, Cardiology Contact information: [place] [address] [phone]                Allergies  Allergen Reactions   Morphine  Rash and Other (See Comments)   Peanut-Containing Drug Products Anaphylaxis and Hives   Penicillins Rash    Rash in 2008.  Tolerated cefazolin in 2020   Chocolate Hives    Consultations: Vascular surgery/nephrology   Procedures/Studies: DG Chest 1 View  Result Date: 08/10/2021 CLINICAL DATA:  Shortness of breath with generalized weakness and fatigue today. EXAM: CHEST  1 VIEW COMPARISON:  06/30/2020 FINDINGS: Heart size and pulmonary vascularity are normal. Lungs are clear. No pleural effusions. No pneumothorax. Mediastinal contours appear intact. IMPRESSION: No active disease. Electronically Signed   By: Lucienne Capers M.D.   On: 08/10/2021 23:23   PERIPHERAL VASCULAR CATHETERIZATION  Result Date: 08/11/2021 Images from the original result were not included. Patient name: [name] MRN: [id number] DOB: 02-May-1981 Sex: female 08/11/2021 Pre-operative Diagnosis: Right foot gangrene Post-operative diagnosis:  Same Surgeon:  [name] Procedure Performed:  1.  Ultrasound-guided access, left femoral artery  2.  Abdominal aortogram  3.  Right lower extremity runoff  4.  Failed angioplasty, right posterior tibial artery  5.  Closure device, Celt  6.  Conscious sedation, 61 minutes  Indications: This is a 41 year old female with end-stage renal disease who presented to the emergency department with worsening pain in her right foot.  She has dry gangrene of the foot and is at extremely high risk for transtibial amputation.  We discussed proceeding with angiography to  evaluate her blood flow prior to any form of debridement of her foot. Procedure:  The patient was identified in the holding area and taken to room 8.  The patient was then placed supine on the table and prepped and draped in the usual sterile fashion.  A time out was called.  Conscious sedation was administered with the use of IV fentanyl and Versed under continuous physician and nurse monitoring.  Heart rate, blood pressure, and oxygen saturation were continuously monitored.  Total sedation time was 61 minutes.  Ultrasound was used to evaluate the left common femoral artery.  It was patent .  A digital ultrasound image was acquired.  A micropuncture needle was used to access the left common femoral artery under ultrasound guidance.  An 018 wire was advanced without resistance and a micropuncture sheath was placed.  The 018 wire was removed and a benson wire was placed.  The micropuncture sheath was exchanged for a 5 french sheath.  An omniflush catheter was advanced over the wire to the level of L-1.  An abdominal angiogram was obtained.  Next, using the omniflush catheter and a benson wire, the aortic bifurcation was crossed and the catheter was placed into theright external iliac artery and right runoff was obtained. Findings:  Aortogram: Approximate 50% bilateral renal artery stenosis.  The infrarenal abdominal aorta is widely patent.  Bilateral common and external iliac arteries are widely patent.  Right Lower Extremity: Right common femoral profundofemoral artery widely patent without stenosis.  Superficial femoral arteries have a calcified without stenosis.  Popliteal artery is widely patent.  The only vessel to cross the ankle is the anterior tibial artery.  The peroneal artery becomes very diminutive in the lower leg.  The posterior tibial artery is patent down to just above the ankle where it occludes.  Left Lower Extremity: Not evaluated Intervention: After the above images were acquired the decision made  to proceed with intervention.  A 5 French 45 cm sheath was advanced over the bifurcation and positioned in the superficial femoral artery.  The patient was fully heparinized.  Next  using a V-18 wire and a 3 x 100 Sterling balloon, I advanced the biliary wire into the posterior tibial artery.  I was able to get the wire down across the ankle however I could not get a 3 mm balloon to track.  I then downsized to a 2 mm Sterling again balloon would not track beyond the ankle.  I then switched out to a Confianza Pro 014 wire and then used a 1 x 20 balloon and again could not get this to track.  Because of the patient's renal disease, and heavy calcification with poor reconstitution of the target I elected to stop.  The left groin was closed with a Celt device without complication. Impression:  #1  No significant aortoiliac occlusive disease.  The superficial femoral and popliteal artery are widely patent on the right  #2  The dominant runoff to the foot is via the anterior tibial artery which does contribute to the plantar arch.  The peroneal artery is diminutive down in the lower leg.  The posterior tibial artery occludes above the ankle.  #3  Unsuccessful attempt at posterior tibial artery recanalization V. [name], M.D., [facility] [place] Office: [phone] Pager:  [phone]   DG Foot 2 Views Right  Result Date: 08/10/2021 CLINICAL DATA:  Pain. Missed dialysis today. Shortness of breath with generalized weakness and fatigue. EXAM: RIGHT FOOT - 2 VIEW COMPARISON:  None. FINDINGS: Soft tissue swelling and soft tissue gas around the medial right forefoot, involving the right first toe and dorsal soft tissues. This is consistent with gas gangrene. No definite bone erosion to suggest osteomyelitis. No acute fracture or dislocation. Extensive vascular calcifications. IMPRESSION: Soft tissue swelling and soft tissue gas over the right first toe and dorsal forefoot consistent  with gas gangrene. No radiographic evidence of osteomyelitis. Electronically Signed   By: Lucienne Capers M.D.   On: 08/10/2021 23:12      Subjective: Patient seen and examined at bedside.  Awake, poor historian.  No overnight fever, seizures, vomiting reported.  Discharge Exam: Vitals:   08/24/21 0624 08/24/21 0755  BP: 134/60 (!) 132/33  Pulse: 89 90  Resp: 18 20  Temp: 98.6 F (37 C) 98.6 F (37 C)  SpO2: 100% 100%    General: Pt is alert, awake, not in acute distress.  Looks chronically ill and deconditioned.  Currently on room air.  Slow to respond.  Poor historian. Cardiovascular: rate controlled, S1/S2 + Respiratory: bilateral decreased breath sounds at bases with some scattered crackles Abdominal: Soft, NT, ND, bowel sounds + Extremities: no edema, no cyanosis.  Right TMA with dressing present.    The results of significant diagnostics from this hospitalization (including imaging, microbiology, ancillary and laboratory) are listed below for reference.     Microbiology: Recent Results (from the past 240 hour(s))  Surgical pcr screen     Status: None   Collection Time: 08/16/21  4:38 AM   Specimen: Nasal Mucosa; Nasal Swab  Result Value Ref Range Status   MRSA, PCR NEGATIVE NEGATIVE Final   Staphylococcus aureus NEGATIVE NEGATIVE Final    Comment: (NOTE) The Xpert SA Assay (FDA approved for NASAL specimens in patients 89 years of age and older), is one component of a comprehensive surveillance program. It is not intended to diagnose infection nor to guide or monitor treatment. Performed at [facility], [place] [address], [place], [place] [address]      Labs: BNP (last 3 results) No results for  input(s): BNP in the last 8760 hours. Basic Metabolic Panel: Recent Labs  Lab 08/18/21 0500 08/19/21 1750 08/20/21 0845 08/22/21 0952  NA 134* 133* 133* 136  K 4.3 4.2 3.9 4.9  CL 97* 96* 95* 98  CO2 '25 22 22 ' 21*  GLUCOSE 247* 186* 111* 125*  BUN 27* 47*  33* 51*  CREATININE 8.17* 11.63* 8.81* 12.97*  CALCIUM 9.3 8.5* 9.3 9.1  PHOS 6.8* 9.6* 7.6* 7.9*   Liver Function Tests: Recent Labs  Lab 08/18/21 0500 08/19/21 1750 08/20/21 0845 08/22/21 0952  ALBUMIN 2.0* 1.7* 2.1* 1.8*   No results for input(s): LIPASE, AMYLASE in the last 168 hours. No results for input(s): AMMONIA in the last 168 hours. CBC: Recent Labs  Lab 08/19/21 1750 08/20/21 0845 08/22/21 0952 08/23/21 0651  WBC 12.6* 14.9* 12.6* 11.2*  HGB 8.6* 9.4* 8.3* 8.5*  HCT 27.5* 30.1* 27.4* 29.0*  MCV 91.1 90.1 90.4 91.5  PLT 210 216 297 266   Cardiac Enzymes: No results for input(s): CKTOTAL, CKMB, CKMBINDEX, TROPONINI in the last 168 hours. BNP: Invalid input(s): POCBNP CBG: Recent Labs  Lab 08/23/21 2146 08/23/21 2155 08/23/21 2219 08/23/21 2326 08/24/21 0622  GLUCAP 59* 65* 84 169* 100*   D-Dimer No results for input(s): DDIMER in the last 72 hours. Hgb A1c No results for input(s): HGBA1C in the last 72 hours. Lipid Profile No results for input(s): CHOL, HDL, LDLCALC, TRIG, CHOLHDL, LDLDIRECT in the last 72 hours. Thyroid function studies No results for input(s): TSH, T4TOTAL, T3FREE, THYROIDAB in the last 72 hours.  Invalid input(s): FREET3 Anemia work up No results for input(s): VITAMINB12, FOLATE, FERRITIN, TIBC, IRON, RETICCTPCT in the last 72 hours. Urinalysis    Component Value Date/Time   COLORURINE YELLOW 02/21/2019 1955   APPEARANCEUR TURBID (A) 02/21/2019 1955   LABSPEC 1.012 02/21/2019 1955   PHURINE 7.0 02/21/2019 1955   GLUCOSEU >=500 (A) 02/21/2019 1955   HGBUR MODERATE (A) 02/21/2019 1955   BILIRUBINUR NEGATIVE 02/21/2019 [place] NEGATIVE 02/21/2019 1955   PROTEINUR 100 (A) 02/21/2019 1955   NITRITE NEGATIVE 02/21/2019 1955   LEUKOCYTESUR MODERATE (A) 02/21/2019 1955   Sepsis Labs Invalid input(s): PROCALCITONIN,  WBC,  LACTICIDVEN Microbiology Recent Results (from the past 240 hour(s))  Surgical pcr screen      Status: None   Collection Time: 08/16/21  4:38 AM   Specimen: Nasal Mucosa; Nasal Swab  Result Value Ref Range Status   MRSA, PCR NEGATIVE NEGATIVE Final   Staphylococcus aureus NEGATIVE NEGATIVE Final    Comment: (NOTE) The Xpert SA Assay (FDA approved for NASAL specimens in patients 68 years of age and older), is one component of a comprehensive surveillance program. It is not intended to diagnose infection nor to guide or monitor treatment. Performed at [facility], [place] [address] [place], [place] [address]      Time coordinating discharge: 35 minutes  SIGNED:   Aline August, MD  [facility] 08/24/2021, 11:37 AM

## 2021-08-24 NOTE — Progress Notes (Signed)
[facility] Progress Note   Subjective: Seen on HD. Supposed to be discharged today.     Objective Vitals:   08/23/21 2328 08/24/21 0624 08/24/21 0755 08/24/21 1152  BP: (!) 145/61 134/60 (!) 132/33 135/65  Pulse: 100 89 90 85  Resp: 18 18 20 20   Temp:  98.6 F (37 C) 98.6 F (37 C) 98 F (36.7 C)  TempSrc:  Oral Oral Oral  SpO2: 100% 100% 100% 100%  Weight:  64.5 kg    Height:       Physical Exam General:chronically ill appearing female in NAD Neuro: Oriented X 3 Heart:RRR, no mrg Lungs:CTAB, nml WOB on RA Abdomen:soft, NTND Extremities:no LE edema, L TMA dressed Dialysis Access: R AVF +T/B  Additional Objective Labs: Basic Metabolic Panel: Recent Labs  Lab 08/19/21 1750 08/20/21 0845 08/22/21 0952  NA 133* 133* 136  K 4.2 3.9 4.9  CL 96* 95* 98  CO2 22 22 21*  GLUCOSE 186* 111* 125*  BUN 47* 33* 51*  CREATININE 11.63* 8.81* 12.97*  CALCIUM 8.5* 9.3 9.1  PHOS 9.6* 7.6* 7.9*   Liver Function Tests: Recent Labs  Lab 08/19/21 1750 08/20/21 0845 08/22/21 0952  ALBUMIN 1.7* 2.1* 1.8*   No results for input(s): LIPASE, AMYLASE in the last 168 hours. CBC: Recent Labs  Lab 08/19/21 1750 08/20/21 0845 08/22/21 0952 08/23/21 0651  WBC 12.6* 14.9* 12.6* 11.2*  HGB 8.6* 9.4* 8.3* 8.5*  HCT 27.5* 30.1* 27.4* 29.0*  MCV 91.1 90.1 90.4 91.5  PLT 210 216 297 266   Blood Culture    Component Value Date/Time   SDES WOUND RIGHT TOE 08/11/2021 1710   SPECREQUEST RT BIG TOE 08/11/2021 1710   CULT FEW STAPHYLOCOCCUS AUREUS 08/11/2021 1710   REPTSTATUS 08/14/2021 FINAL 08/11/2021 1710    Cardiac Enzymes: No results for input(s): CKTOTAL, CKMB, CKMBINDEX, TROPONINI in the last 168 hours. CBG: Recent Labs  Lab 08/23/21 2155 08/23/21 2219 08/23/21 2326 08/24/21 0622 08/24/21 1154  GLUCAP 65* 84 169* 100* 93   Iron Studies: No results for input(s): IRON, TIBC, TRANSFERRIN, FERRITIN in the last 72  hours. @lablastinr3 @ Studies/Results: No results found. Medications:  sodium chloride     sodium chloride 500 mL (08/14/21 2116)    ceFAZolin (ANCEF) IV 2 g (08/22/21 1716)    sodium chloride   Intravenous Once   Chlorhexidine Gluconate Cloth  6 each Topical Q0600   Chlorhexidine Gluconate Cloth  6 each Topical Q0600   cinacalcet  90 mg Oral Daily   darbepoetin (ARANESP) injection - DIALYSIS  150 mcg Intravenous Q Tue-HD   feeding supplement  1 Container Oral BID BM   heparin  5,000 Units Subcutaneous Q8H   insulin aspart  0-6 Units Subcutaneous TID WC & HS   insulin glargine-yfgn  12 Units Subcutaneous QHS   LORazepam  1 mg Intravenous Once   losartan  50 mg Oral Daily   multivitamin  1 tablet Oral QHS   sevelamer carbonate  1.6 g Oral TID WC   sodium chloride flush  3 mL Intravenous Q12H   sodium chloride flush  3 mL Intravenous Q12H     Dialysis Orders: [place] [facility]   3h 41min  400/1.5  68.2kg  2/2 bath  15g  Hep 4000  AVF  - mircera 220mcg IV q 2 weeks- last dose 12/27  - hectorol 9 mcg IV q HD   - sensipar 90mg  2 tabs po QHS   - home meds > amlodipine 10mg  QD, renvela  2.4g 1 ac tid   Assessment/ Plan: R great toe diabetic infection: s/p angiogram and 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  IV antibiotics per primary team.  Currently on Ancef 2g IV qHD x 6 weeks with end date 09/22/21.  Per [facility] notes stable for DC with HH. No plans for further amputation at this point, F/U with [facility] in one week as OP.  ESRD:  HD TTS.  HD today on schedule. Next HD on 08/26/21 at [place].   Hypertension/volume: BP mildly elevated this AM.  On losartan 50mg  qd.  Does not appear volume overloaded, UF as tolerated. Very much under OP EDW.  Lower EDW to post wt today.   Anemia CKD and blood loss: outpatient HD notes reveal pt had noticed red blood in her stools and had been referred to GI outpatient. Hgb 6.6 on admit, last improved to 9.4 s/p 2 units PRBC's on 12/31. HGB 8.5 today. Given Aranesp 150 mcg on 08/22/21.    Metabolic bone disease: Hyperphosphatemia improving.  On renvela packets - increased dose to 1.6 gram TID. Hypercalcemia when corrected for albumin - 10.9 corrected as of 1/6.  We stopped high dose hectorol.   We restarted sensipar here. Use 2.0 Ca bath.  T2DM: hyperglycemic on admit, had not been taking her insulin at home. Management per primary team. Nutrition - Renal/carb modified diet w/fluid restrictions.  Anahla Bevis H. Buell Parcel NP-C 08/24/2021, 2:30 PM  [employer] [phone]

## 2021-08-24 NOTE — Progress Notes (Signed)
Pt was unable to be d/c this am due to need for clarification of pt's safe d/c plan. Pt reported different information to RN this am from what she informed RN CM yesterday. Contacted [facility] to make clinic aware pt will d/c today and resume care on Saturday. Renal NP will provide iv abx orders to clinic at d/c.   Melven Sartorius Renal Navigator [phone]

## 2021-08-24 NOTE — Progress Notes (Signed)
PHARMACY CONSULT NOTE FOR:  cefazolin with hemodialysis  OUTPATIENT  PARENTERAL ANTIBIOTIC THERAPY (OPAT)  Indication: osteomyelitis Regimen: Cefazolin 2g IV three times weekly with dialysis End date: 09/22/21  IV antibiotic discharge orders are pended. To discharging provider:  please sign these orders via discharge navigator,  Select New Orders & click on the button choice - Manage This Unsigned Work.     Thank you for allowing pharmacy to be a part of this patient's care.  Erin Hearing PharmD., BCPS Clinical Pharmacist 08/24/2021 8:55 AM

## 2021-08-25 DIAGNOSIS — A4101 Sepsis due to Methicillin susceptible Staphylococcus aureus: Secondary | ICD-10-CM | POA: Insufficient documentation

## 2021-08-25 DIAGNOSIS — I96 Gangrene, not elsewhere classified: Secondary | ICD-10-CM | POA: Insufficient documentation

## 2021-08-25 DIAGNOSIS — S98921A Partial traumatic amputation of right foot, level unspecified, initial encounter: Secondary | ICD-10-CM | POA: Insufficient documentation

## 2021-08-25 LAB — GLUCOSE, CAPILLARY
Glucose-Capillary: 97 mg/dL (ref 70–99)
Glucose-Capillary: 69 mg/dL — ABNORMAL LOW (ref 70–99)
Glucose-Capillary: 131 mg/dL — ABNORMAL HIGH (ref 70–99)
Glucose-Capillary: 58 mg/dL — ABNORMAL LOW (ref 70–99)

## 2021-08-25 MED ORDER — INSULIN GLARGINE-YFGN 100 UNIT/ML ~~LOC~~ SOLN
7.0000 [IU] | Freq: Every day | SUBCUTANEOUS | Status: DC
  Filled 2021-08-25: qty 0.07

## 2021-08-25 NOTE — TOC Transition Note (Signed)
Transition of Care (TOC) - CM/SW Discharge Note Marvetta Gibbons RN, BSN Transitions of Care Unit 4E- RN Case Manager See Treatment Team for direct phone #    Patient Details  Name: [name] MRN: [id number] Date of Birth: Dec 03, 1980  Transition of Care [facility]) CM/SW Contact:  Dawayne Patricia, RN Phone Number: 08/25/2021, 1:04 PM   Clinical Narrative:    Pt for transition home today, multiple attempts made to contact sister with no answer and no return call. CM spoke with pt at bedside and she states she will call and speak with her sister. Pt reporting that her husband will be back in town tomorrow around 2pm.  When asked how she gets to HD- pt states she uses [facility], pt reporting that she feels she can get herself ready for HD in the am as long as transportation is confirmed, Call made to [place] ([facility]) and confirmed pt has standing schedule for 5:00-5:30 am pickup on Sat/Tue/Thur- she is scheduled for a pickup in the AM at this time for HD. She needs to be at HD by 6am for a 6:15 chair time.  Pt informed her [facility] transportation is scheduled and confirmed.   Received msg from OT that pt has 2 kids middle school aged that she has called the school and requested them to be dropped off at home this afternoon. Per the bedside RN pt states that the kids have been staying with her sister- however the youngest usually stays with her and the older one with the sister. Tonight both apparently will be staying with the patient.   Have spoken with [facility] and [facility] will plan to do team visit for start of care late this afternoon and to check in with pt.  DME- RW and 3n1 have been delivered to room for pt to take home with her. Unit will arrange [facility] for door-to door service to assist pt in getting home today.  Pt is agreeable to plan and seems eager to return home.    Final next level of care: [place] Barriers to Discharge: Barriers Resolved   Patient Goals and  CMS Choice Patient states their goals for this hospitalization and ongoing recovery are:: return home CMS Medicare.gov Compare Post Acute Care list provided to:: Patient Choice offered to / list presented to : Patient  Discharge Placement                   Home w/ [facility]    Discharge Plan and Services   Discharge Planning Services: CM Consult Post Acute Care Choice: Durable Medical Equipment, Home Health          DME Arranged: 3-N-1, Walker rolling DME Agency: [employer] Date DME Agency Contacted: 08/23/21 Time DME Agency Contacted: [phone] Representative spoke with at DME Agency: Freda Munro HH Arranged: RN, OT, PT [facility] Agency: [place] Date [place]: 08/23/21 Time [place]: [id number] Representative spoke with at [place]: Amy  Social Determinants of Health ([place]) Interventions     Readmission Risk Interventions Readmission Risk Prevention Plan 08/23/2021  Transportation Screening Complete  HRI or [name] Complete  Social Work Consult for [place] Planning/Counseling [place] Not Applicable  Medication Review [employer]) Complete  Some recent data might be hidden

## 2021-08-25 NOTE — Progress Notes (Incomplete)
Hypoglycemic Event  CBG: 69  Treatment: 4 oz juice/soda  Symptoms: None  Follow-up CBG: Time:*** CBG Result:***  Possible Reasons for Event: Inadequate meal intake  Comments/MD notified:***    Susann Givens, Dene Gentry

## 2021-08-25 NOTE — Progress Notes (Addendum)
Patient ID: [name], female   DOB: 07-Dec-1980, 41 y.o.   MRN: [id number] Patient was supposed to be discharged on 08/24/2021.  She had further episodes of hypoglycemia since then.  DC long-acting insulin; she was planned for discharge on the same as well.  Patient seen and examined at bedside.  Patient is currently medically stable for discharge.  Please refer to the full discharge summary done by me on 08/24/2021 for full details.

## 2021-08-25 NOTE — Progress Notes (Signed)
SWAT RN reviewed discharge education with patient.  This RN provided pt with updated AVS as insulin has been discontinued due to hypoglycemia.  As per MD, pt okay for discharge to home.

## 2021-08-25 NOTE — Care Management Important Message (Signed)
Important Message  Patient Details  Name: [name] MRN: [id number] Date of Birth: 04-Dec-1980   Medicare Important Message Given:  Yes     Shelda Altes 08/25/2021, 10:32 AM

## 2021-08-25 NOTE — Progress Notes (Signed)
Physical Therapy Treatment Patient Details Name: Dayzha Pogosyan MRN: [id number] DOB: 09-10-1980 Today's Date: 08/25/2021   History of Present Illness 41 year old with history of type 2 diabetes on insulin, ESRD on hemodialysis TTS schedule, neuropathy, hypertension, R eye blindness, with poor compliance came to the emergency room with right foot pain and infection.  She was found to have gangrenous toes.  Underwent angiogram and urgent transmetatarsal amputation.    PT Comments    The pt was agreeable to OOB mobility this session, but continues to need minG-minA to complete transfers and is unable to manage more than pivoting steps to recliner at this time with cues for technique and safety. The pt will require physical assist for any prolonged OOB mobility and fatigues quickly, therefore recommend WC for mobility in the home to allow for greater independence. The pt will continue to benefit from skilled PT to progress functional strength, stability, and activity tolerance after d/c.    Recommendations for follow up therapy are one component of a multi-disciplinary discharge planning process, led by the attending physician.  Recommendations may be updated based on patient status, additional functional criteria and insurance authorization.  Follow Up Recommendations  Home health PT     Assistance Recommended at Discharge Frequent or constant Supervision/Assistance  Patient can return home with the following Assist for transportation;A lot of help with walking and/or transfers;Assistance with cooking/housework;Direct supervision/assist for medications management;Direct supervision/assist for financial management;Help with stairs or ramp for entrance   Equipment Recommendations  Wheelchair (measurements PT);Wheelchair cushion (measurements PT);Rolling walker (2 wheels);BSC/3in1;Hospital bed    Recommendations for Other Services       Precautions / Restrictions Precautions Precautions:  Fall Precaution Comments: darco shoe R LE Required Braces or Orthoses: Other Brace Other Brace: Darco shoe R LE Restrictions Weight Bearing Restrictions: Yes RUE Weight Bearing: Weight bearing as tolerated RLE Weight Bearing: Weight bearing as tolerated RLE Partial Weight Bearing Percentage or Pounds: heel only     Mobility  Bed Mobility Overal bed mobility: Modified Independent Bed Mobility: Rolling;Sidelying to Sit Rolling: Min guard Sidelying to sit: Min guard       General bed mobility comments: increased time, use of bed rail    Transfers Overall transfer level: Needs assistance Equipment used: Rolling walker (2 wheels) Transfers: Sit to/from Stand Sit to Stand: Min assist Stand pivot transfers: Min assist Squat pivot transfers: Min guard Step pivot transfers: Min guard     General transfer comment: minG with increased time and encouragement to power up to standing. dependent on BUE support on either RW or counter when at sink. small pivoting steps to recliner from EOB, pt stating " I need to sit, I need to sit" after half of pivot transfer, encouraged to continue pivoting so she could sit, but did not continue with movement until given that verbal cue    Ambulation/Gait Ambulation/Gait assistance: Min assist Gait Distance (Feet): 3 Feet Assistive device: Rolling walker (2 wheels) Gait Pattern/deviations: Step-to pattern Gait velocity: decreased Gait velocity interpretation: <1.31 ft/sec, indicative of household ambulator   General Gait Details: small, pivoting steps to recliner with heavy reliance on BUE support on RW and minimal wt bearing on RLE      Balance Overall balance assessment: Needs assistance Sitting-balance support: Single extremity supported Sitting balance-Leahy Scale: Good Sitting balance - Comments: able to lean to reposition mesh underwear in sitting, no LOB   Standing balance support: Bilateral upper extremity supported;Reliant on  assistive device for balance;During functional activity Standing balance-Leahy  Scale: Poor Standing balance comment: able to power up but with poor hip extension, not reaching full upright stand                            Cognition Arousal/Alertness: Awake/alert Behavior During Therapy: Flat affect Overall Cognitive Status: Impaired/Different from baseline Area of Impairment: Memory;Awareness;Problem solving                   Current Attention Level: Selective Memory: Decreased recall of precautions;Decreased short-term memory Following Commands: Follows multi-step commands with increased time Safety/Judgement: Decreased awareness of safety;Decreased awareness of deficits Awareness: Intellectual Problem Solving: Slow processing General Comments: pt with poor initiation of tasks or problem solving at this time. needing verbal cues and at times visual cues to make adjustments to movements. for example: "turning on cold water at sink and stating "it is not warm, which one is warm then?" and needing cues to try other knob for warm water.        Exercises      General Comments General comments (skin integrity, edema, etc.): RA VSS      Pertinent Vitals/Pain Pain Assessment: Faces Faces Pain Scale: Hurts a little bit Pain Location: grimacing Pain Descriptors / Indicators: Sore Pain Intervention(s): Monitored during session;Repositioned     PT Goals (current goals can now be found in the care plan section) Acute Rehab PT Goals Patient Stated Goal: to go home PT Goal Formulation: With patient Time For Goal Achievement: 08/29/21 Potential to Achieve Goals: Fair Progress towards PT goals: Progressing toward goals    Frequency    Min 3X/week      PT Plan Current plan remains appropriate    Co-evaluation PT/OT/SLP Co-Evaluation/Treatment: Yes Reason for Co-Treatment: For patient/therapist safety;Necessary to address cognition/behavior during functional  activity;To address functional/ADL transfers PT goals addressed during session: Mobility/safety with mobility;Balance;Proper use of DME;Strengthening/ROM OT goals addressed during session: ADL's and self-care;Proper use of Adaptive equipment and DME;Strengthening/ROM      AM-PAC PT "6 Clicks" Mobility   Outcome Measure  Help needed turning from your back to your side while in a flat bed without using bedrails?: None Help needed moving from lying on your back to sitting on the side of a flat bed without using bedrails?: A Little Help needed moving to and from a bed to a chair (including a wheelchair)?: A Little Help needed standing up from a chair using your arms (e.g., wheelchair or bedside chair)?: A Little Help needed to walk in hospital room?: Total Help needed climbing 3-5 steps with a railing? : Total 6 Click Score: 15    End of Session Equipment Utilized During Treatment: Gait belt Activity Tolerance: Patient tolerated treatment well Patient left: in chair;with call bell/phone within reach;with chair alarm set Nurse Communication: Mobility status;Patient requests pain meds PT Visit Diagnosis: Other abnormalities of gait and mobility (R26.89);Muscle weakness (generalized) (M62.81);Pain Pain - Right/Left: Right Pain - part of body: Ankle and joints of foot     Time: 9292-4462 PT Time Calculation (min) (ACUTE ONLY): 25 min  Charges:  $Therapeutic Exercise: 8-22 mins                     [place] Carbo, PT, DPT   Acute Rehabilitation Department Pager #: [phone]   Sandra Cockayne 08/25/2021, 1:54 PM

## 2021-08-25 NOTE — Plan of Care (Signed)
  Problem: Activity: Goal: Risk for activity intolerance will decrease Outcome: Not Progressing   

## 2021-08-25 NOTE — Progress Notes (Signed)
[facility]  Subjective  - Denies pain in the right LE   Objective (!) 91/52 88 99.7 F (37.6 C) (Oral) 18 100%  Intake/Output Summary (Last 24 hours) at 08/25/2021 0739 Last data filed at 08/25/2021 0054 Gross per 24 hour  Intake 203 ml  Output 2000 ml  Net -1797 ml       DP doppler signal Lungs non labored   Assessment/Planning: POD # 9 right TMA  Dry dressing daily  She will continue ancef for 6 weeks with HD Ok for discharge home from vascular standpoint; office will arrange follow up in 7-10 days.  She is aware of her risk for more proximal amputation  [name] 08/25/2021 7:39 AM --  Laboratory Lab Results: Recent Labs    08/23/21 0651 08/24/21 1453  WBC 11.2* 13.1*  HGB 8.5* 8.0*  HCT 29.0* 25.7*  PLT 266 294   BMET Recent Labs    08/22/21 0952 08/24/21 1453  NA 136 140  K 4.9 4.6  CL 98 100  CO2 21* 26  GLUCOSE 125* 64*  BUN 51* 31*  CREATININE 12.97* 10.60*  CALCIUM 9.1 9.2    COAG Lab Results  Component Value Date   INR 1.2 08/10/2021   INR 1.1 06/30/2020   No results found for: PTT

## 2021-08-25 NOTE — Progress Notes (Signed)
Occupational Therapy Treatment Patient Details Name: [name] MRN: [id number] DOB: 12/09/1980 Today's Date: 08/25/2021   History of present illness 41 year old with history of type 2 diabetes on insulin, ESRD on hemodialysis TTS schedule, neuropathy, hypertension, R eye blindness, with poor compliance came to the emergency room with right foot pain and infection.  She was found to have gangrenous toes.  Underwent angiogram and urgent transmetatarsal amputation.   OT comments  Pt dressed and bathed during session at sink level as precursor to d/c today. Pt calling [employer] informing the school to place her children on a bus home. SW notified of concern for the patient d/c and children now being redirected from school plans. Pt demonstrates cognitive deficits during session. Recommendation for HHOT pending d/c today.    Recommendations for follow up therapy are one component of a multi-disciplinary discharge planning process, led by the attending physician.  Recommendations may be updated based on patient status, additional functional criteria and insurance authorization.    Follow Up Recommendations  Home health OT    Assistance Recommended at Discharge Frequent or constant Supervision/Assistance  Patient can return home with the following  A lot of help with bathing/dressing/bathroom;A lot of help with walking and/or transfers   Equipment Recommendations  BSC/3in1;Wheelchair (measurements OT);Wheelchair cushion (measurements OT);Hospital bed;Other (comment)    Recommendations for Other Services Other (comment)    Precautions / Restrictions Precautions Precautions: Fall Precaution Comments: darco shoe R LE Required Braces or Orthoses: Other Brace Other Brace: Darco shoe R LE Restrictions Weight Bearing Restrictions: Yes RUE Weight Bearing: Weight bearing as tolerated RLE Weight Bearing: Weight bearing as tolerated RLE Partial Weight Bearing Percentage or Pounds:  heel only       Mobility Bed Mobility Overal bed mobility: Modified Independent             General bed mobility comments: sitting L side of the bed    Transfers Overall transfer level: Needs assistance Equipment used: Rolling walker (2 wheels) Transfers: Sit to/from Stand Sit to Stand: Min assist Stand pivot transfers: Min assist         General transfer comment: pulling up on RW with cues. pt taking 2 steps then pivoting to chair . pt insisting that she must sit down     Balance Overall balance assessment: Needs assistance   Sitting balance-Leahy Scale: Good     Standing balance support: Bilateral upper extremity supported;Reliant on assistive device for balance;During functional activity Standing balance-Leahy Scale: Poor                             ADL either performed or assessed with clinical judgement   ADL Overall ADL's : Needs assistance/impaired     Grooming: Set up;Sitting   Upper Body Bathing: Set up;Sitting   Lower Body Bathing: Set up;Sit to/from stand   Upper Body Dressing : Set up;Sitting   Lower Body Dressing: Moderate assistance;Sit to/from stand Lower Body Dressing Details (indicate cue type and reason): educated to dress the R LE first. pt needs (A) to pull up brief and pants   Toilet Transfer Details (indicate cue type and reason): incontinence of bowels and lack of awareness.         Functional mobility during ADLs: Minimal assistance;Rolling walker (2 wheels) General ADL Comments: pt declines bathing wants to dress then declines dressing to bath.    Extremity/Trunk Assessment  Vision       Perception     Praxis      Cognition Arousal/Alertness: Awake/alert Behavior During Therapy: Flat affect Overall Cognitive Status: Impaired/Different from baseline Area of Impairment: Memory;Awareness;Problem solving                   Current Attention Level: Selective Memory: Decreased recall  of precautions;Decreased short-term memory Following Commands: Follows multi-step commands with increased time Safety/Judgement: Decreased awareness of safety;Decreased awareness of deficits Awareness: Intellectual Problem Solving: Slow processing General Comments: unaware who is taking her home or how she is getting home. Rn after session reports the patient was informed 1 hour prior to OT session.          Exercises     Shoulder Instructions       General Comments RA VSS    Pertinent Vitals/ Pain       Pain Assessment: Faces Faces Pain Scale: Hurts a little bit Pain Location: grimacing Pain Intervention(s): Monitored during session;Repositioned  Home Living                                          Prior Functioning/Environment              Frequency  Min 2X/week        Progress Toward Goals  OT Goals(current goals can now be found in the care plan section)  Progress towards OT goals: Progressing toward goals  Acute Rehab OT Goals Patient Stated Goal: to take a bath OT Goal Formulation: With patient Time For Goal Achievement: 08/30/21 Potential to Achieve Goals: Good ADL Goals Pt Will Perform Grooming: sitting;with modified independence Pt Will Perform Upper Body Bathing: sitting;with modified independence Pt Will Perform Lower Body Bathing: sitting/lateral leans;with modified independence Pt Will Perform Upper Body Dressing: sitting;with modified independence Pt Will Perform Lower Body Dressing: sitting/lateral leans;with caregiver independent in assisting;with min guard assist Pt Will Transfer to Toilet: stand pivot transfer;bedside commode;with modified independence Pt Will Perform Toileting - Clothing Manipulation and hygiene: sitting/lateral leans;with caregiver independent in assisting;with modified independence  Plan Discharge plan remains appropriate    Co-evaluation    PT/OT/SLP Co-Evaluation/Treatment: Yes Reason for  Co-Treatment: For patient/therapist safety;Necessary to address cognition/behavior during functional activity;To address functional/ADL transfers PT goals addressed during session: Mobility/safety with mobility;Balance;Proper use of DME;Strengthening/ROM OT goals addressed during session: ADL's and self-care;Proper use of Adaptive equipment and DME;Strengthening/ROM      AM-PAC OT "6 Clicks" Daily Activity     Outcome Measure   Help from another person eating meals?: None Help from another person taking care of personal grooming?: None Help from another person toileting, which includes using toliet, bedpan, or urinal?: A Little Help from another person bathing (including washing, rinsing, drying)?: A Little Help from another person to put on and taking off regular upper body clothing?: None Help from another person to put on and taking off regular lower body clothing?: A Little 6 Click Score: 21    End of Session Equipment Utilized During Treatment: Rolling walker (2 wheels)  OT Visit Diagnosis: Unsteadiness on feet (R26.81) Pain - Right/Left: Right Pain - part of body: Ankle and joints of foot   Activity Tolerance Patient tolerated treatment well   Patient Left in chair;with call bell/phone within reach;with chair alarm set   Nurse Communication Mobility status;Precautions;Weight bearing status        Time:  6948-5462 OT Time Calculation (min): 25 min  Charges: OT General Charges $OT Visit: 1 Visit OT Treatments $Self Care/Home Management : 8-22 mins   [name], [name]  Acute Rehabilitation Services Pager: [phone] Office: [phone] .   Jeri Modena 08/25/2021, 12:30 PM

## 2021-08-25 NOTE — Progress Notes (Signed)
Contacted [facility] and spoke to [facility] manager, Elmyra Ricks. Made clinic aware pt will d/c today and will resume tomorrow. Clinic manager states that clinic received needed orders including orders for iv abx with treatments. Clinic is prepared to resume care tomorrow.   Melven Sartorius Renal Navigator [phone]

## 2021-08-25 NOTE — Progress Notes (Signed)
[facility] Progress Note   Subjective: Seen in room, going home possibly today  Objective Vitals:   08/24/21 2046 08/25/21 0203 08/25/21 0449 08/25/21 0820  BP: 137/69 (!) 130/57 (!) 91/52 (!) 147/71  Pulse: 90 78 88   Resp: 18 18 18    Temp: 98 F (36.7 C)  99.7 F (37.6 C) 100.3 F (37.9 C)  TempSrc: Oral  Oral Oral  SpO2: 97% 100% 100% 99%  Weight:   61 kg   Height:       Physical Exam General:chronically ill appearing female in NAD Neuro: Oriented X 3 Heart:RRR, no mrg Lungs:CTAB, nml WOB on RA Abdomen:soft, NTND Extremities:no LE edema, L TMA dressed Dialysis Access: R AVF +T/B  Additional Objective Labs: Basic Metabolic Panel: Recent Labs  Lab 08/20/21 0845 08/22/21 0952 08/24/21 1453  NA 133* 136 140  K 3.9 4.9 4.6  CL 95* 98 100  CO2 22 21* 26  GLUCOSE 111* 125* 64*  BUN 33* 51* 31*  CREATININE 8.81* 12.97* 10.60*  CALCIUM 9.3 9.1 9.2  PHOS 7.6* 7.9* 7.1*    Liver Function Tests: Recent Labs  Lab 08/20/21 0845 08/22/21 0952 08/24/21 1453  ALBUMIN 2.1* 1.8* 1.7*    No results for input(s): LIPASE, AMYLASE in the last 168 hours. CBC: Recent Labs  Lab 08/19/21 1750 08/20/21 0845 08/22/21 0952 08/23/21 0651 08/24/21 1453  WBC 12.6* 14.9* 12.6* 11.2* 13.1*  HGB 8.6* 9.4* 8.3* 8.5* 8.0*  HCT 27.5* 30.1* 27.4* 29.0* 25.7*  MCV 91.1 90.1 90.4 91.5 90.2  PLT 210 216 297 266 294    Blood Culture    Component Value Date/Time   SDES WOUND RIGHT TOE 08/11/2021 1710   SPECREQUEST RT BIG TOE 08/11/2021 1710   CULT FEW STAPHYLOCOCCUS AUREUS 08/11/2021 1710   REPTSTATUS 08/14/2021 FINAL 08/11/2021 1710    Cardiac Enzymes: No results for input(s): CKTOTAL, CKMB, CKMBINDEX, TROPONINI in the last 168 hours. CBG: Recent Labs  Lab 08/24/21 2059 08/25/21 0206 08/25/21 0607 08/25/21 0816 08/25/21 1154  GLUCAP 139* 97 69* 131* 58*    Iron Studies: No results for input(s): IRON, TIBC, TRANSFERRIN, FERRITIN in the last 72  hours. @lablastinr3 @ Studies/Results: No results found. Medications:  sodium chloride     sodium chloride 500 mL (08/14/21 2116)    ceFAZolin (ANCEF) IV 2 g (08/24/21 1852)    Chlorhexidine Gluconate Cloth  6 each Topical Q0600   Chlorhexidine Gluconate Cloth  6 each Topical Q0600   cinacalcet  90 mg Oral Daily   darbepoetin (ARANESP) injection - DIALYSIS  150 mcg Intravenous Q Tue-HD   feeding supplement  1 Container Oral BID BM   heparin  5,000 Units Subcutaneous Q8H   insulin aspart  0-6 Units Subcutaneous TID WC & HS   LORazepam  1 mg Intravenous Once   losartan  50 mg Oral Daily   multivitamin  1 tablet Oral QHS   sevelamer carbonate  1.6 g Oral TID WC   sodium chloride flush  3 mL Intravenous Q12H   sodium chloride flush  3 mL Intravenous Q12H     Dialysis Orders: [facility]   3h 13min  400/1.5  68.2kg  2/2 bath  15g  Hep 4000  AVF  - mircera 248mcg IV q 2 weeks- last dose 12/27  - hectorol 9 mcg IV q HD   - sensipar 90mg  2 tabs po QHS   - home meds > amlodipine 10mg  QD, renvela 2.4g 1 ac tid   Assessment/ Plan: R  great toe diabetic infection: s/p angiogram and 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  IV antibiotics per primary team.  Currently on Ancef 2g IV qHD x 6 weeks with end date 09/22/21.  Per [facility] notes stable for DC with HH. No plans for further amputation at this point, F/U with [facility] in one week as OP.    ESRD:  HD TTS.  Next HD on 08/26/21 at [place].   Hypertension/volume: BP mildly elevated this AM.  On losartan 50mg  qd.  Does not appear volume overloaded, UF as tolerated. Very much under OP EDW.  Lower EDW upon dc.   Anemia CKD and blood loss: outpatient HD notes reveal pt had noticed red blood in her stools and had been referred to GI outpatient. Hgb 6.6 on admit, last improved to 9.4 s/p 2 units PRBC's on 12/31. HGB 8.5. Given Aranesp 150 mcg on 08/22/21.   Metabolic bone disease: Hyperphosphatemia improving.  On renvela packets - increased dose to 1.6 gram TID. Hypercalcemia when  corrected for albumin - 10.9 corrected as of 1/6.  We stopped high dose hectorol.   We restarted sensipar here. Use 2.0 Ca bath.  T2DM: hyperglycemic on admit, had not been taking her insulin at home. Management per primary team. Nutrition - Renal/carb modified diet w/fluid restrictions.  Pt seen, examined and agree w A/P as above.  Tracey Splinter  MD 08/25/2021, 12:28 PM

## 2021-08-26 ENCOUNTER — Encounter (HOSPITAL_COMMUNITY): Payer: Self-pay | Admitting: Emergency Medicine

## 2021-08-26 ENCOUNTER — Emergency Department (HOSPITAL_COMMUNITY)
Admission: EM | Admit: 2021-08-26 | Discharge: 2021-08-26 | Disposition: A | Discharge status: Home / Self Care | Payer: Medicare Other | Attending: Emergency Medicine | Admitting: Emergency Medicine

## 2021-08-26 ENCOUNTER — Other Ambulatory Visit: Payer: Self-pay

## 2021-08-26 ENCOUNTER — Emergency Department (HOSPITAL_COMMUNITY): Payer: Medicare Other

## 2021-08-26 DIAGNOSIS — M79671 Pain in right foot: Secondary | ICD-10-CM | POA: Insufficient documentation

## 2021-08-26 DIAGNOSIS — E119 Type 2 diabetes mellitus without complications: Secondary | ICD-10-CM | POA: Insufficient documentation

## 2021-08-26 DIAGNOSIS — Z91010 Allergy to peanuts: Secondary | ICD-10-CM | POA: Diagnosis not present

## 2021-08-26 DIAGNOSIS — Z794 Long term (current) use of insulin: Secondary | ICD-10-CM | POA: Insufficient documentation

## 2021-08-26 MED ORDER — OXYCODONE-ACETAMINOPHEN 5-325 MG PO TABS
1.0000 | ORAL_TABLET | Freq: Once | ORAL | Status: AC
  Administered 2021-08-26: 1 via ORAL
  Filled 2021-08-26: qty 1

## 2021-08-26 MED ORDER — FENTANYL CITRATE PF 50 MCG/ML IJ SOSY
12.5000 ug | PREFILLED_SYRINGE | Freq: Once | INTRAMUSCULAR | Status: DC
Start: 2021-08-26 — End: 2021-08-26

## 2021-08-26 MED ORDER — OXYCODONE-ACETAMINOPHEN 5-325 MG PO TABS
1.0000 | ORAL_TABLET | Freq: Four times a day (QID) | ORAL | 0 refills | Status: DC | PRN
Start: 2021-08-26 — End: 2021-09-01

## 2021-08-26 NOTE — ED Notes (Signed)
RN at bedside to get IV. Pt sitting in wheelchair asleep w/ son, assisted pt into bed, RN changed dressing on R foot, pt yelling at RN "I'm cold get me a blanket", RN explained I would get her one once her wound was dressed. Pt continued to yell "I'm cold". RN gave snacks and water to pt's son. Pt given warm blankets and wrapped herself up in them, pt was soiled w/ feces. RN asked pt if we could clean her up, pt told RN "leave me alone". RN attempted for IV, unsuccessful, pt told RN "don't stick me again". Phlebotomy called to get labs, Ashley PA aware.

## 2021-08-26 NOTE — ED Notes (Signed)
Pt given new paper scrubs, towels & wash clothes, socks, and mesh underwear so she could clean herself. Pt asked for different underwear, explained this was all we have. Pt asked for crutches, RN gave pt crutches.

## 2021-08-26 NOTE — TOC Initial Note (Addendum)
Transition of Care [facility]) - Initial/Assessment Note    Patient Details  Name: [name] MRN: [id number] Date of Birth: 10-01-1980  [facility] of [facility] CM/SW Contact:    Alfredia Ferguson, LCSW Phone Number: 08/26/2021, 1:33 PM  Clinical Narrative:             2:35p: CPS report completed      CSW received [facility] consult regarding patient coming to emergency department one day after being discharged from inpatient medicine and notably with her son, [name], who was asking for food. CSW went to meet with patient and patient was noted under covers. Patient's son was in the room eating snacks provided by ED staff. Patient would audibly go "huh" on several verbal prompts until her son uncovered her head from the blankets. CSW noted patient's eyes were blood shot and dilated. CSW introduced herself and role to patient and noted patient stated she just needs pain medicines. CSW attempted to engage about living situation and patient would either respond with "huh" or ask for pain medications.   Per chart patient lives with her son and spouse while her other son [name] lives with her sister. CSW attempted to contact sister as she was emergency contact and noted no answer. CSW attempted spouse and noted voicemail box had a different name than spouses reported name. CSW is quite unclear of the housing situation but notes concerns with patient being minimally involved, her son expressing he is quite hungry, her not interacting with her son, requests for pain medicine (unclear if there is a concern here or due to medical needs) and CSW notes her son was not in the room and presumably wandering around the ED unsupervised. CSW reached out to clarify with RN.  CSW notes limited details but based on current presentation and questionable living environment CSW will reach out to CPS to initiate a report.   Expected Discharge Plan: [place] Services Barriers to Discharge: Other (must enter comment) (see  narrative)   Patient Goals and CMS Choice Patient states their goals for this hospitalization and ongoing recovery are:: "I just need pain pills." CMS Medicare.gov Compare Post Acute Care list provided to:: Patient    Expected Discharge Plan and Services Expected Discharge Plan: [place] Choice: Durable Medical Equipment, Home Health Living arrangements for the past 2 months: Apartment                                      Prior Living Arrangements/Services Living arrangements for the past 2 months: Apartment Lives with:: Minor Children, Spouse Patient language and need for interpreter reviewed:: Yes Do you feel safe going back to the place where you live?: Yes      Need for Family Participation in Patient Care: Yes (Comment) Care giver support system in place?: No (comment) Current home services: DME Criminal Activity/Legal Involvement Pertinent to Current Situation/Hospitalization: No - Comment as needed  Activities of Daily Living      Permission Sought/Granted Permission sought to share information with : Family Supports Permission granted to share information with : No              Emotional Assessment Appearance:: Appears older than stated age Attitude/Demeanor/Rapport: Apprehensive Affect (typically observed): Blunt, Agitated Orientation: : Oriented to Self, Oriented to Place, Oriented to  Time, Oriented to Situation   Psych Involvement: No (  comment)  Admission diagnosis:  foot pain Patient Active Problem List   Diagnosis Date Noted   Hyponatremia 08/13/2021   Gangrene of right foot ([place]) 08/11/2021   Uncontrolled type 2 diabetes mellitus with hyperglycemia ([place]) 08/11/2021   Encephalopathy acute 07/01/2020   Acute encephalopathy 06/30/2020   Hypertensive urgency 06/30/2020   Blindness of right eye 06/30/2020   Type 2 diabetes mellitus with hyperglycemia, with long-term current use of insulin ([place])  05/13/2020   Type 2 diabetes mellitus with proliferative retinopathy, with long-term current use of insulin ([place]) 05/13/2020   Fever    Hyperglycemia due to type 2 diabetes mellitus ([place]) 02/21/2019   Suspected COVID-19 virus infection 02/21/2019   ESRD on hemodialysis ([place]) 02/21/2019   Anemia of chronic disease 02/11/2019   Diabetes mellitus ([place]) 02/11/2019   HLD (hyperlipidemia) 02/11/2019   PTSD (post-traumatic stress disorder) 02/11/2019   PVD (peripheral vascular disease) ([place]) 02/11/2019   Retinopathy 02/11/2019   Secondary hyperparathyroidism of renal origin ([place]) 02/11/2019   Cataracts, bilateral 01/05/2015   Hyperphosphatemia 01/05/2015   Status post amputation of toe of right foot ([place]) 01/05/2015   PCP:  [facility], [facility] Pharm[facility]:   [facility] [place], [address] [place] [address] Phone: [phone] Fax: [place] [facility] [address] [place] [address] Phone: [phone] Fax: [phone]     Social Determinants of Health (SDOH) Interventions    Readmission Risk Interventions Readmission Risk Prevention Plan 08/23/2021  Transportation Screening Complete  HRI or [place] Complete  Social Work Consult for [place] Planning/Counseling Complete  Palliative Care Screening Not Applicable  Medication Review Press photographer) Complete  Some recent data might be hidden

## 2021-08-26 NOTE — ED Notes (Signed)
SW at bedside.
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
## 2021-08-26 NOTE — ED Provider Triage Note (Signed)
Emergency Medicine Provider Triage Evaluation Note  [name] , a 41 y.o. female  was evaluated in triage.  Pt complains of right foot pain.  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  Discharge note states that she was recommended to follow-up with vascular surgery and will continue 6 weeks of Ancef for the infection.  She is concerned due to throbbing pain.  Does not have any pain medicine at home.  Denies any injury.  Review of Systems  Positive: Right foot pain Negative: Injury  Physical Exam  BP (!) 162/94    Pulse (!) 103    Temp 98.9 F (37.2 C) (Oral)    Resp 14    SpO2 99%  Gen:   Awake, no distress   Resp:  Normal effort  MSK:   Moves extremities without difficulty  Other:  Dressing of right foot  Medical Decision Making  Medically screening exam initiated at 9:38 AM.  Appropriate orders placed.  Linzi Ohlinger was informed that the remainder of the evaluation will be completed by another provider, this initial triage assessment does not replace that evaluation, and the importance of remaining in the ED until their evaluation is complete.  Labs ordered   Delia Heady, [place] 08/26/21 7800

## 2021-08-26 NOTE — Discharge Instructions (Signed)
Please take Tylenol as needed for pain.  You may also take Percocet for breakthrough pain every 6 hours as needed.  Would like for you to follow-up with your primary care doctor for further evaluation.  Please return to the emergency department for worsening symptoms.

## 2021-08-26 NOTE — ED Notes (Signed)
Pt refused blood draw from phlebotomy, Raul Del PA aware

## 2021-08-26 NOTE — ED Notes (Signed)
Code alarm sounded from pt's room. Pt's son had pulled code alarm because pt told him to because she is in pain. Augustina Mood and Emlyn MD aware.

## 2021-08-26 NOTE — ED Notes (Signed)
RN reviewed discharge instructions w/ pt. Follow up and pain management reviewed. Pt and her son were given [place] sandwiches, crackers & peanut butter. [place] taxi called for pt.

## 2021-08-26 NOTE — ED Triage Notes (Signed)
Pt to triage via [facility] from home.  Reports R big toe amputation 2 days ago.  C/o throbbing to R foot since yesterday with fever and chills.

## 2021-08-28 ENCOUNTER — Encounter: Payer: Self-pay | Admitting: Nephrology

## 2021-08-30 LAB — VITAMIN C

## 2021-09-01 ENCOUNTER — Other Ambulatory Visit: Payer: Self-pay | Admitting: Physician Assistant

## 2021-09-01 ENCOUNTER — Telehealth: Payer: Self-pay

## 2021-09-01 MED ORDER — OXYCODONE-ACETAMINOPHEN 5-325 MG PO TABS: 1.0000 | ORAL_TABLET | Freq: Four times a day (QID) | ORAL | 0 refills | Status: DC | PRN

## 2021-09-01 NOTE — Progress Notes (Signed)
Pt has appt on 09/04/2021.  Called today requesting pain medication as she bumped her foot.  Given she has appt on Monday, Rx for percocet 5/325 one q6h prn pain #8 no refill sent to pharmacy.  PDMP reviewed.   [name], [facility] 09/01/2021 1:23 PM

## 2021-09-01 NOTE — Telephone Encounter (Signed)
Pt's [facility] called to let us know pt hit her foot in doorway yesterday and is in 9/10. It has started draining serosanguinous and some yellow drainage is starting. Pt is f/u in office Monday and is requesting pain meds. APP is aware and ordered this and pt is aware of her f/u on Monday.

## 2021-09-04 ENCOUNTER — Ambulatory Visit (INDEPENDENT_AMBULATORY_CARE_PROVIDER_SITE_OTHER): Payer: Medicare Other | Admitting: Physician Assistant

## 2021-09-04 ENCOUNTER — Ambulatory Visit: Payer: Medicare Other | Admitting: Internal Medicine

## 2021-09-04 ENCOUNTER — Other Ambulatory Visit: Payer: Self-pay

## 2021-09-04 ENCOUNTER — Encounter: Payer: Self-pay | Admitting: Pediatrics

## 2021-09-04 VITALS — BP 169/89 | HR 87 | Temp 97.8°F | Resp 18 | Ht 61.0 in | Wt 134.0 lb

## 2021-09-04 DIAGNOSIS — I739 Peripheral vascular disease, unspecified: Secondary | ICD-10-CM

## 2021-09-04 MED ORDER — OXYCODONE-ACETAMINOPHEN 5-325 MG PO TABS: 1.0000 | ORAL_TABLET | Freq: Four times a day (QID) | ORAL | 0 refills | Status: DC | PRN

## 2021-09-04 NOTE — Progress Notes (Deleted)
Name: [name]  Age/ Sex: 41 y.o., female   MRN/ DOB: [id number], 1981-04-03     PCP[facility]   Reason for Endocrinology Evaluation: Type 2 Diabetes Mellitus  Initial Endocrine Consultative Visit: 05/13/2020    PATIENT IDENTIFIER: [name] is a 41 y.o. female with a past medical history of T2DM, ESRD on HD ( 2016)  and PVD . The patient has followed with Endocrinology clinic since 05/13/2020 for consultative assistance with management of her diabetes.  DIABETIC HISTORY:  [name] was diagnosed with DM at age 42,has been on insulin for years. Her hemoglobin A1c has ranged from 11.2% in 2021, peaking at 11.3% in 2020.  On her initial visit to our clinic she had an A1c 11.2% SUBJECTIVE:   During the last visit (09/12/2020): A1c 10.3% adjusted MDI regimen provided a correction scale    Today (09/04/2021): [name] is here for a follow up on diabetes follow up. She has not been to our office in 12 months. She is accompanied by her spouse today.  She checks her blood sugars 0 times daily.  The patient is unknown to have  hypoglycemic episodes since the last clinic visit.   Ran out of basal insulin for a while even though she has refills for up to a year which is until  05/2021      HOME DIABETES REGIMEN:  lantus 20 units daily Novolog 5 units with each meal  CF: NovoLog (BG -130/45)    Statin: no ACE-I/ARB: on HD    METER DOWNLOAD SUMMARY: No readings    DIABETIC COMPLICATIONS: Microvascular complications:  Retinopathy ( blind right eye), ESRD on HD Denies: neuropathy Last Eye Exam: Completed 09/28/2019  Macrovascular complications:   Denies: CAD, CVA, PVD   HISTORY:  Past Medical History:  Past Medical History:  Diagnosis Date   Anemia    Anxiety    Blind right eye 2008   Diabetes mellitus without complication ([place])    Dialysis patient ([place])    ESRD (end stage renal disease) ([place])    Dialysis T/Th/Sa   Past  Surgical History:  Past Surgical History:  Procedure Laterality Date   AMPUTATION Right 08/11/2021   Procedure: TRANSMETATARSAL AMPUTATION OF RIGHT FOOT;  Surgeon: Serafina Mitchell, MD;  Location: [place];  Service: Vascular;  Laterality: Right;   AMPUTATION TOE Left    APPLICATION OF WOUND VAC  08/16/2021   Procedure: APPLICATION OF WOUND VAC;  Surgeon: Serafina Mitchell, MD;  Location: [place];  Service: Vascular;;   AV FISTULA PLACEMENT     CESAREAN SECTION  2011   FISTULA SUPERFICIALIZATION Right 56/21/3086   Procedure: 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;  Surgeon: Angelia Mould, MD;  Location: [place];  Service: Vascular;  Laterality: Right;   INSERTION OF DIALYSIS CATHETER Left 07/27/2019   Procedure: INSERTION OF TUNNELED  DIALYSIS CATHETER;  Surgeon: Angelia Mould, MD;  Location: [place];  Service: Vascular;  Laterality: Left;   LOWER EXTREMITY ANGIOGRAPHY N/A 08/11/2021   Procedure: LOWER EXTREMITY ANGIOGRAPHY;  Surgeon: Serafina Mitchell, MD;  Location: [place] [facility];  Service: Cardiovascular;  Laterality: N/A;   TRANSMETATARSAL AMPUTATION Right 08/16/2021   Procedure: CLOSURE OF RIGHT TRANSMETATARSAL AMPUTATION;  Surgeon: Serafina Mitchell, MD;  Location: [place] [facility];  Service: Vascular;  Laterality: Right;   Social History:  reports that she has never smoked. She has never used smokeless tobacco. She reports  that she does not drink alcohol and does not use drugs. Family History:  Family History  Problem Relation Age of Onset   Diabetes Mother    Diabetes Father      HOME MEDICATIONS: Allergies as of 09/04/2021       Reactions   Morphine Rash, Other (See Comments)   Peanut-containing Drug Products Anaphylaxis, Hives   Penicillins Rash   Rash in 2008.  Tolerated cefazolin in 2020   Chocolate Hives   Chocolate Flavor Hives   Peanut (diagnostic) Hives        Medication List        Accurate as of September 04, 2021  7:11 AM. If you have any  questions, ask your nurse or doctor.          Accu-Chek FastClix Lancets Misc Use as instructed to check blood sugar up to TID. E11.22 What changed:  how much to take how to take this when to take this   Accu-Chek Guide Me w/Device Kit 1 kit by Does not apply route 3 (three) times daily. Use to check BG at home up to 3 times daily. E11.22   Accu-Chek Guide test strip Generic drug: glucose blood Use as instructed to check blood sugar up to TID. E11.22 What changed:  how much to take how to take this when to take this   calcitRIOL 0.25 MCG capsule Commonly known as: ROCALTROL Take 0.25 mcg by mouth 2 (two) times daily.   ceFAZolin  IVPB Commonly known as: ANCEF Inject 2 g into the vein Every Tuesday,Thursday,and Saturday with dialysis. Indication:  osteomyelitis First Dose: No Last Day of Therapy:  09/21/21 with dialysis Labs - Once weekly:  CBC/D and BMP, Labs - Every other week:  ESR and CRP Method of administration: IV Push Method of administration may be changed at the discretion of home infusion pharmacist based upon assessment of the patient and/or caregiver's ability to self-administer the medication ordered.   cinacalcet 90 MG tablet Commonly known as: SENSIPAR Take 90 mg by mouth daily.   Dexcom G6 Receiver Devi 1 Device by Does not apply route as directed.   Dexcom G6 Sensor Misc 1 Device by Does not apply route as directed.   Dexcom G6 Transmitter Misc 1 Device by Does not apply route as directed.   dorzolamide-timolol 22.3-6.8 MG/ML ophthalmic solution Commonly known as: COSOPT Place 1 drop into the left eye 2 (two) times daily.   HAIR/SKIN/NAILS PO Take 1 capsule by mouth daily.   Insulin Pen Needle 32G X 4 MM Misc 1 Device by Does not apply route as directed.   lidocaine-prilocaine cream Commonly known as: EMLA Apply 1 application topically as directed.   losartan 50 MG tablet Commonly known as: COZAAR Take 1 tablet (50 mg total) by mouth  daily.   multivitamin Tabs tablet Take 1 tablet by mouth daily.   oxyCODONE-acetaminophen 5-325 MG tablet Commonly known as: Percocet Take 1 tablet by mouth every 6 (six) hours as needed for severe pain.   sevelamer carbonate 0.8 g Pack packet Commonly known as: RENVELA Take 1.6 g by mouth 3 (three) times daily with meals.         OBJECTIVE:   Vital Signs: There were no vitals taken for this visit.  Wt Readings from Last 3 Encounters:  08/25/21 134 lb 7.7 oz (61 kg)  09/12/20 162 lb 2 oz (73.5 kg)  07/02/20 159 lb 2.8 oz (72.2 kg)     Exam: General: Pt appears well and  is in NAD  Lungs: Clear with good BS bilat with no rales, rhonchi, or wheezes  Heart: RRR   Extremities: No pretibial edema.   Neuro: MS is good with appropriate affect, pt is alert and Ox3    DATA REVIEWED:  Lab Results  Component Value Date   HGBA1C 10.4 (H) 08/11/2021   HGBA1C 10.3 (A) 09/12/2020   HGBA1C 9.9 (H) 06/30/2020   Lab Results  Component Value Date   LDLCALC 97 06/30/2020   CREATININE 10.60 (H) 08/24/2021   Lab Results  Component Value Date   MICRALBCREAT 958 (H) 07/17/2019     Lab Results  Component Value Date   CHOL 173 06/30/2020   HDL 60 06/30/2020   LDLCALC 97 06/30/2020   TRIG 82 06/30/2020   CHOLHDL 2.9 06/30/2020         ASSESSMENT / PLAN / RECOMMENDATIONS:   1) Type 2 Diabetes Mellitus, Poorly controlled, With retinopathic complications and ESRD on HD- Most recent A1c of 10.3 %. Goal A1c < 7.0 %.    - Pt with low health literacy as well as medication non-adherence  - She has ran out of lantus, she was unable to tell me when was the last time she used it, she had a year refill until 05/2021 - We again emphasized the importance of glycemic control in protecting her organs and slowing down progression of any current damage  - Will prescribe Dexcom , spouse will help with this     MEDICATIONS: - Restarted Lantus 20 units daily  - Continue Novolog 5 units  with each meal  - CF: Novolog ( BG - 130/45)    EDUCATION / INSTRUCTIONS: BG monitoring instructions: Patient is instructed to check her blood sugars 3 times a day, before meals. Call [facility] if: BG persistently < 70  I reviewed the Rule of 15 for the treatment of hypoglycemia in detail with the patient. Literature supplied.     2) Diabetic complications:  Eye: Does have known diabetic retinopathy.  Neuro/ Feet: Does not have known diabetic peripheral neuropathy. Renal: Patient does have known baseline ESRD on HD. .        F/U in 3 months    Signed electronically by: Mack Guise, MD  [facility] End[facility]  [facility] [address] [place], [place] [address] Phone: [phone] FAX: [phone]   [place], [facility] [place] [address] Phone: [phone]  Fax: [phone]  Return to Endocrinology clinic as below: Future Appointments  Date Time Provider [place]  09/04/2021  8:10 AM Harlie Ragle, Melanie Crazier, MD [facility] None  09/04/2021 10:15 AM [facility] PA [facility]

## 2021-09-04 NOTE — Progress Notes (Signed)
POST OPERATIVE OFFICE NOTE    CC:  F/u for surgery  HPI:  This is a 41 y.o. female who presented with right foot gangrene.  She first underwent angiography with failed angioplasty, right posterior tibial artery.  Dr. Trula Slade took her to the OR and performed right open TMA.  The tissue appeared viable and prior to discharge she returned to the OR for primary TMA closure 08/16/21.    She takes pain medication twice a day and states she is not ambulating secondary to pain in the right foot with weight bearing.  He foot feel best when elevated.  She denies fever and chills.  Allergies  Allergen Reactions   Morphine Rash and Other (See Comments)   Peanut-Containing Drug Products Anaphylaxis and Hives   Penicillins Rash    Rash in 2008.  Tolerated cefazolin in 2020   Chocolate Hives   Chocolate Flavor Hives   Peanut (Diagnostic) Hives    Current Outpatient Medications  Medication Sig Dispense Refill   Accu-Chek FastClix Lancets MISC Use as instructed to check blood sugar up to TID. E11.22 (Patient taking differently: 1 each by Other route See admin instructions. Use as instructed to check blood sugar up to TID. E11.22) 102 each 11   Biotin w/ Vitamins C & E (HAIR/SKIN/NAILS PO) Take 1 capsule by mouth daily.     Blood Glucose Monitoring Suppl (ACCU-CHEK GUIDE ME) w/Device KIT 1 kit by Does not apply route 3 (three) times daily. Use to check BG at home up to 3 times daily. E11.22 1 kit 0   calcitRIOL (ROCALTROL) 0.25 MCG capsule Take 0.25 mcg by mouth 2 (two) times daily.      ceFAZolin (ANCEF) IVPB Inject 2 g into the vein Every Tuesday,Thursday,and Saturday with dialysis. Indication:  osteomyelitis First Dose: No Last Day of Therapy:  09/21/21 with dialysis Labs - Once weekly:  CBC/D and BMP, Labs - Every other week:  ESR and CRP Method of administration: IV Push Method of administration may be changed at the discretion of home infusion pharmacist based upon assessment of the patient and/or  caregiver's ability to self-administer the medication ordered. 29 Units 0   cinacalcet (SENSIPAR) 90 MG tablet Take 90 mg by mouth daily.      Continuous Blood Gluc Receiver (DEXCOM G6 RECEIVER) DEVI 1 Device by Does not apply route as directed. 1 each 0   Continuous Blood Gluc Sensor (DEXCOM G6 SENSOR) MISC 1 Device by Does not apply route as directed. 9 each 3   Continuous Blood Gluc Transmit (DEXCOM G6 TRANSMITTER) MISC 1 Device by Does not apply route as directed. 1 each 3   dorzolamide-timolol (COSOPT) 22.3-6.8 MG/ML ophthalmic solution Place 1 drop into the left eye 2 (two) times daily.     glucose blood (ACCU-CHEK GUIDE) test strip Use as instructed to check blood sugar up to TID. E11.22 (Patient taking differently: 1 each by Other route See admin instructions. Use as instructed to check blood sugar up to TID. E11.22) 100 each 12   Insulin Pen Needle 32G X 4 MM MISC 1 Device by Does not apply route as directed. 150 each 11   lidocaine-prilocaine (EMLA) cream Apply 1 application topically as directed.      losartan (COZAAR) 50 MG tablet Take 1 tablet (50 mg total) by mouth daily. 30 tablet 2   multivitamin (RENA-VIT) TABS tablet Take 1 tablet by mouth daily. 90 tablet 3   oxyCODONE-acetaminophen (PERCOCET) 5-325 MG tablet Take 1 tablet by mouth every  6 (six) hours as needed for severe pain. 30 tablet 0   sevelamer carbonate (RENVELA) 0.8 g PACK packet Take 1.6 g by mouth 3 (three) times daily with meals. 180 packet 0   No current facility-administered medications for this visit.     ROS:  See HPI  Physical Exam:     Incision:  appears viable  Extremities:  Doppler DP/AT signals intact    Assessment/Plan:  This is a 41 y.o. female who is s/p:TMA for non healing gangrene wound right LE.  She will try and become more mobile with time.  I did refill her percocet for a finale time # 30.  She will f/u in 2 weeks for wound check and suture removal.  If her pain is sever or the incision  fails to heal she will require a BKA.   -   Roxy Horseman PA-C [facility] [phone]   Clinic MD:  Trula Slade

## 2021-09-14 NOTE — Progress Notes (Deleted)
POST OPERATIVE OFFICE NOTE    CC:  F/u for surgery  HPI:  This is a 41 y.o. female who presented with right foot gangrene.   She underwent angiography and had failed angioplasty of the right PTA 08/11/2021 by Dr. Trula Slade.  Later that day, she was taken to the OR and underwent open right TMA also by Dr. Trula Slade.  She was taken back to the OR and is s/p revision of right foot TMA (resected bone back further back as well as skin and soft tissue on 08/16/2021 by Dr. Trula Slade.   Findings intraop:  resected approximately another centimeter of metatarsal bone on all 5 digits.  Also removed muscle and subcutaneous tissue on the posterior flap as well as skin edges.  The wound is marginal for healing.  She was seen on 09/04/2021 and at that time,  she was not ambulating well secondary to pain with weight bearing.  She feels best when the foot is elevated.  She was not having fever or chills.  She was encouraged to increase her mobilization, her percocet was refilled for a final time and she was scheduled for 2 week f/u.  If her pain is severe or incision does not heal, she will require a BKA.  Pt returns today for follow up.  Pt states ***  Allergies  Allergen Reactions   Morphine Rash and Other (See Comments)   Peanut-Containing Drug Products Anaphylaxis and Hives   Penicillins Rash    Rash in 2008.  Tolerated cefazolin in 2020   Chocolate Hives   Chocolate Flavor Hives   Peanut (Diagnostic) Hives    Current Outpatient Medications  Medication Sig Dispense Refill   Accu-Chek FastClix Lancets MISC Use as instructed to check blood sugar up to TID. E11.22 (Patient taking differently: 1 each by Other route See admin instructions. Use as instructed to check blood sugar up to TID. E11.22) 102 each 11   Biotin w/ Vitamins C & E (HAIR/SKIN/NAILS PO) Take 1 capsule by mouth daily.     Blood Glucose Monitoring Suppl (ACCU-CHEK GUIDE ME) w/Device KIT 1 kit by Does not apply route 3 (three) times daily. Use to  check BG at home up to 3 times daily. E11.22 1 kit 0   calcitRIOL (ROCALTROL) 0.25 MCG capsule Take 0.25 mcg by mouth 2 (two) times daily.      ceFAZolin (ANCEF) IVPB Inject 2 g into the vein Every Tuesday,Thursday,and Saturday with dialysis. Indication:  osteomyelitis First Dose: No Last Day of Therapy:  09/21/21 with dialysis Labs - Once weekly:  CBC/D and BMP, Labs - Every other week:  ESR and CRP Method of administration: IV Push Method of administration may be changed at the discretion of home infusion pharmacist based upon assessment of the patient and/or caregiver's ability to self-administer the medication ordered. 29 Units 0   cinacalcet (SENSIPAR) 90 MG tablet Take 90 mg by mouth daily.      Continuous Blood Gluc Receiver (DEXCOM G6 RECEIVER) DEVI 1 Device by Does not apply route as directed. 1 each 0   Continuous Blood Gluc Sensor (DEXCOM G6 SENSOR) MISC 1 Device by Does not apply route as directed. 9 each 3   Continuous Blood Gluc Transmit (DEXCOM G6 TRANSMITTER) MISC 1 Device by Does not apply route as directed. 1 each 3   dorzolamide-timolol (COSOPT) 22.3-6.8 MG/ML ophthalmic solution Place 1 drop into the left eye 2 (two) times daily.     glucose blood (ACCU-CHEK GUIDE) test strip Use as instructed to  check blood sugar up to TID. E11.22 (Patient taking differently: 1 each by Other route See admin instructions. Use as instructed to check blood sugar up to TID. E11.22) 100 each 12   Insulin Pen Needle 32G X 4 MM MISC 1 Device by Does not apply route as directed. 150 each 11   lidocaine-prilocaine (EMLA) cream Apply 1 application topically as directed.      losartan (COZAAR) 50 MG tablet Take 1 tablet (50 mg total) by mouth daily. 30 tablet 2   multivitamin (RENA-VIT) TABS tablet Take 1 tablet by mouth daily. 90 tablet 3   oxyCODONE-acetaminophen (PERCOCET) 5-325 MG tablet Take 1 tablet by mouth every 6 (six) hours as needed for severe pain. 30 tablet 0   sevelamer carbonate (RENVELA)  0.8 g PACK packet Take 1.6 g by mouth 3 (three) times daily with meals. 180 packet 0   No current facility-administered medications for this visit.     ROS:  See HPI  Physical Exam:  ***  Incision:  *** Extremities:  *** Neuro: *** Abdomen:  ***   Assessment/Plan:  This is a 41 y.o. female who is s/p: angiography and had failed angioplasty of the right PTA 08/11/2021 by Dr. Trula Slade.  Later that day, she was taken to the OR and underwent open right TMA also by Dr. Trula Slade.  She was taken back to the OR and is s/p revision of right foot TMA (resected bone back further back as well as skin and soft tissue on 08/16/2021 by Dr. Trula Slade.   -***   [name], [facility] [phone]   Clinic MD:  Virl Cagey

## 2021-09-15 ENCOUNTER — Emergency Department (HOSPITAL_COMMUNITY): Payer: Medicare Other

## 2021-09-15 ENCOUNTER — Inpatient Hospital Stay (HOSPITAL_COMMUNITY): Payer: Medicare Other

## 2021-09-15 ENCOUNTER — Inpatient Hospital Stay (HOSPITAL_COMMUNITY)
Admission: EM | Admit: 2021-09-15 | Discharge: 2021-10-25 | DRG: 616 | Disposition: A | Discharge status: Other Acute Inpatient Hospital | Payer: Medicare Other | Source: Ambulatory Visit | Attending: Internal Medicine | Admitting: Internal Medicine

## 2021-09-15 ENCOUNTER — Telehealth: Payer: Self-pay

## 2021-09-15 DIAGNOSIS — G629 Polyneuropathy, unspecified: Secondary | ICD-10-CM | POA: Diagnosis not present

## 2021-09-15 DIAGNOSIS — E1122 Type 2 diabetes mellitus with diabetic chronic kidney disease: Secondary | ICD-10-CM | POA: Diagnosis present

## 2021-09-15 DIAGNOSIS — Z89421 Acquired absence of other right toe(s): Secondary | ICD-10-CM

## 2021-09-15 DIAGNOSIS — Z4659 Encounter for fitting and adjustment of other gastrointestinal appliance and device: Secondary | ICD-10-CM

## 2021-09-15 DIAGNOSIS — Z91010 Allergy to peanuts: Secondary | ICD-10-CM

## 2021-09-15 DIAGNOSIS — I6340 Cerebral infarction due to embolism of unspecified cerebral artery: Secondary | ICD-10-CM | POA: Diagnosis not present

## 2021-09-15 DIAGNOSIS — M869 Osteomyelitis, unspecified: Secondary | ICD-10-CM | POA: Diagnosis present

## 2021-09-15 DIAGNOSIS — E8809 Other disorders of plasma-protein metabolism, not elsewhere classified: Secondary | ICD-10-CM | POA: Diagnosis present

## 2021-09-15 DIAGNOSIS — Q2112 Patent foramen ovale: Secondary | ICD-10-CM

## 2021-09-15 DIAGNOSIS — D638 Anemia in other chronic diseases classified elsewhere: Secondary | ICD-10-CM | POA: Diagnosis not present

## 2021-09-15 DIAGNOSIS — G610 Guillain-Barre syndrome: Secondary | ICD-10-CM | POA: Diagnosis present

## 2021-09-15 DIAGNOSIS — N185 Chronic kidney disease, stage 5: Secondary | ICD-10-CM | POA: Diagnosis not present

## 2021-09-15 DIAGNOSIS — L89152 Pressure ulcer of sacral region, stage 2: Secondary | ICD-10-CM | POA: Diagnosis present

## 2021-09-15 DIAGNOSIS — D631 Anemia in chronic kidney disease: Secondary | ICD-10-CM | POA: Diagnosis present

## 2021-09-15 DIAGNOSIS — T82590A Other mechanical complication of surgically created arteriovenous fistula, initial encounter: Secondary | ICD-10-CM | POA: Diagnosis not present

## 2021-09-15 DIAGNOSIS — Z7189 Other specified counseling: Secondary | ICD-10-CM | POA: Diagnosis not present

## 2021-09-15 DIAGNOSIS — Z452 Encounter for adjustment and management of vascular access device: Secondary | ICD-10-CM

## 2021-09-15 DIAGNOSIS — Z833 Family history of diabetes mellitus: Secondary | ICD-10-CM

## 2021-09-15 DIAGNOSIS — E440 Moderate protein-calorie malnutrition: Secondary | ICD-10-CM | POA: Diagnosis present

## 2021-09-15 DIAGNOSIS — T82898A Other specified complication of vascular prosthetic devices, implants and grafts, initial encounter: Secondary | ICD-10-CM | POA: Diagnosis not present

## 2021-09-15 DIAGNOSIS — I70221 Atherosclerosis of native arteries of extremities with rest pain, right leg: Secondary | ICD-10-CM | POA: Diagnosis not present

## 2021-09-15 DIAGNOSIS — G9341 Metabolic encephalopathy: Secondary | ICD-10-CM | POA: Diagnosis present

## 2021-09-15 DIAGNOSIS — T148XXA Other injury of unspecified body region, initial encounter: Secondary | ICD-10-CM | POA: Diagnosis not present

## 2021-09-15 DIAGNOSIS — E11649 Type 2 diabetes mellitus with hypoglycemia without coma: Secondary | ICD-10-CM | POA: Diagnosis not present

## 2021-09-15 DIAGNOSIS — R0602 Shortness of breath: Secondary | ICD-10-CM

## 2021-09-15 DIAGNOSIS — B370 Candidal stomatitis: Secondary | ICD-10-CM | POA: Diagnosis present

## 2021-09-15 DIAGNOSIS — Z978 Presence of other specified devices: Secondary | ICD-10-CM

## 2021-09-15 DIAGNOSIS — I672 Cerebral atherosclerosis: Secondary | ICD-10-CM | POA: Diagnosis present

## 2021-09-15 DIAGNOSIS — Z91018 Allergy to other foods: Secondary | ICD-10-CM

## 2021-09-15 DIAGNOSIS — R471 Dysarthria and anarthria: Secondary | ICD-10-CM | POA: Diagnosis not present

## 2021-09-15 DIAGNOSIS — I120 Hypertensive chronic kidney disease with stage 5 chronic kidney disease or end stage renal disease: Secondary | ICD-10-CM | POA: Diagnosis present

## 2021-09-15 DIAGNOSIS — Z515 Encounter for palliative care: Secondary | ICD-10-CM | POA: Diagnosis not present

## 2021-09-15 DIAGNOSIS — I771 Stricture of artery: Secondary | ICD-10-CM | POA: Diagnosis present

## 2021-09-15 DIAGNOSIS — Y848 Other medical procedures as the cause of abnormal reaction of the patient, or of later complication, without mention of misadventure at the time of the procedure: Secondary | ICD-10-CM | POA: Diagnosis not present

## 2021-09-15 DIAGNOSIS — Z992 Dependence on renal dialysis: Secondary | ICD-10-CM

## 2021-09-15 DIAGNOSIS — L089 Local infection of the skin and subcutaneous tissue, unspecified: Secondary | ICD-10-CM | POA: Diagnosis not present

## 2021-09-15 DIAGNOSIS — I749 Embolism and thrombosis of unspecified artery: Secondary | ICD-10-CM | POA: Diagnosis not present

## 2021-09-15 DIAGNOSIS — I953 Hypotension of hemodialysis: Secondary | ICD-10-CM | POA: Diagnosis not present

## 2021-09-15 DIAGNOSIS — N2581 Secondary hyperparathyroidism of renal origin: Secondary | ICD-10-CM | POA: Diagnosis present

## 2021-09-15 DIAGNOSIS — Z6822 Body mass index (BMI) 22.0-22.9, adult: Secondary | ICD-10-CM

## 2021-09-15 DIAGNOSIS — Z885 Allergy status to narcotic agent status: Secondary | ICD-10-CM

## 2021-09-15 DIAGNOSIS — E43 Unspecified severe protein-calorie malnutrition: Secondary | ICD-10-CM | POA: Diagnosis not present

## 2021-09-15 DIAGNOSIS — E1165 Type 2 diabetes mellitus with hyperglycemia: Secondary | ICD-10-CM | POA: Diagnosis present

## 2021-09-15 DIAGNOSIS — Z993 Dependence on wheelchair: Secondary | ICD-10-CM

## 2021-09-15 DIAGNOSIS — I6389 Other cerebral infarction: Secondary | ICD-10-CM | POA: Diagnosis not present

## 2021-09-15 DIAGNOSIS — I998 Other disorder of circulatory system: Secondary | ICD-10-CM | POA: Diagnosis not present

## 2021-09-15 DIAGNOSIS — G9340 Encephalopathy, unspecified: Secondary | ICD-10-CM | POA: Diagnosis not present

## 2021-09-15 DIAGNOSIS — E1169 Type 2 diabetes mellitus with other specified complication: Secondary | ICD-10-CM | POA: Diagnosis present

## 2021-09-15 DIAGNOSIS — L8990 Pressure ulcer of unspecified site, unspecified stage: Secondary | ICD-10-CM | POA: Insufficient documentation

## 2021-09-15 DIAGNOSIS — E1151 Type 2 diabetes mellitus with diabetic peripheral angiopathy without gangrene: Secondary | ICD-10-CM | POA: Diagnosis not present

## 2021-09-15 DIAGNOSIS — E876 Hypokalemia: Secondary | ICD-10-CM | POA: Diagnosis present

## 2021-09-15 DIAGNOSIS — E1142 Type 2 diabetes mellitus with diabetic polyneuropathy: Secondary | ICD-10-CM | POA: Diagnosis present

## 2021-09-15 DIAGNOSIS — H02403 Unspecified ptosis of bilateral eyelids: Secondary | ICD-10-CM | POA: Diagnosis not present

## 2021-09-15 DIAGNOSIS — A4101 Sepsis due to Methicillin susceptible Staphylococcus aureus: Secondary | ICD-10-CM | POA: Diagnosis not present

## 2021-09-15 DIAGNOSIS — R6521 Severe sepsis with septic shock: Secondary | ICD-10-CM | POA: Diagnosis not present

## 2021-09-15 DIAGNOSIS — I959 Hypotension, unspecified: Secondary | ICD-10-CM

## 2021-09-15 DIAGNOSIS — N186 End stage renal disease: Secondary | ICD-10-CM | POA: Diagnosis present

## 2021-09-15 DIAGNOSIS — H3321 Serous retinal detachment, right eye: Secondary | ICD-10-CM | POA: Diagnosis present

## 2021-09-15 DIAGNOSIS — I639 Cerebral infarction, unspecified: Secondary | ICD-10-CM | POA: Diagnosis not present

## 2021-09-15 DIAGNOSIS — T8781 Dehiscence of amputation stump: Secondary | ICD-10-CM | POA: Diagnosis not present

## 2021-09-15 DIAGNOSIS — R400 Somnolence: Secondary | ICD-10-CM | POA: Diagnosis not present

## 2021-09-15 DIAGNOSIS — G1229 Other motor neuron disease: Secondary | ICD-10-CM | POA: Diagnosis not present

## 2021-09-15 DIAGNOSIS — S91309A Unspecified open wound, unspecified foot, initial encounter: Secondary | ICD-10-CM

## 2021-09-15 DIAGNOSIS — H543 Unqualified visual loss, both eyes: Secondary | ICD-10-CM | POA: Diagnosis present

## 2021-09-15 DIAGNOSIS — Z20822 Contact with and (suspected) exposure to covid-19: Secondary | ICD-10-CM | POA: Diagnosis present

## 2021-09-15 DIAGNOSIS — R64 Cachexia: Secondary | ICD-10-CM | POA: Diagnosis not present

## 2021-09-15 DIAGNOSIS — R1312 Dysphagia, oropharyngeal phase: Secondary | ICD-10-CM | POA: Diagnosis not present

## 2021-09-15 DIAGNOSIS — Z794 Long term (current) use of insulin: Secondary | ICD-10-CM | POA: Diagnosis not present

## 2021-09-15 DIAGNOSIS — I82C21 Chronic embolism and thrombosis of right internal jugular vein: Secondary | ICD-10-CM | POA: Diagnosis present

## 2021-09-15 DIAGNOSIS — R7989 Other specified abnormal findings of blood chemistry: Secondary | ICD-10-CM | POA: Diagnosis present

## 2021-09-15 DIAGNOSIS — E872 Acidosis, unspecified: Secondary | ICD-10-CM | POA: Diagnosis present

## 2021-09-15 DIAGNOSIS — H499 Unspecified paralytic strabismus: Secondary | ICD-10-CM | POA: Diagnosis not present

## 2021-09-15 DIAGNOSIS — E889 Metabolic disorder, unspecified: Secondary | ICD-10-CM | POA: Diagnosis present

## 2021-09-15 DIAGNOSIS — T783XXA Angioneurotic edema, initial encounter: Secondary | ICD-10-CM | POA: Diagnosis not present

## 2021-09-15 DIAGNOSIS — K8043 Calculus of bile duct with acute cholecystitis with obstruction: Secondary | ICD-10-CM

## 2021-09-15 DIAGNOSIS — I330 Acute and subacute infective endocarditis: Secondary | ICD-10-CM | POA: Diagnosis present

## 2021-09-15 DIAGNOSIS — G4710 Hypersomnia, unspecified: Secondary | ICD-10-CM | POA: Diagnosis present

## 2021-09-15 DIAGNOSIS — T8744 Infection of amputation stump, left lower extremity: Secondary | ICD-10-CM | POA: Diagnosis not present

## 2021-09-15 DIAGNOSIS — R404 Transient alteration of awareness: Secondary | ICD-10-CM | POA: Diagnosis not present

## 2021-09-15 DIAGNOSIS — I340 Nonrheumatic mitral (valve) insufficiency: Secondary | ICD-10-CM | POA: Diagnosis not present

## 2021-09-15 DIAGNOSIS — R1310 Dysphagia, unspecified: Secondary | ICD-10-CM

## 2021-09-15 DIAGNOSIS — H5010 Unspecified exotropia: Secondary | ICD-10-CM | POA: Diagnosis present

## 2021-09-15 DIAGNOSIS — Z9889 Other specified postprocedural states: Secondary | ICD-10-CM

## 2021-09-15 DIAGNOSIS — Z91199 Patient's noncompliance with other medical treatment and regimen due to unspecified reason: Secondary | ICD-10-CM

## 2021-09-15 DIAGNOSIS — Z89512 Acquired absence of left leg below knee: Secondary | ICD-10-CM

## 2021-09-15 DIAGNOSIS — Z79899 Other long term (current) drug therapy: Secondary | ICD-10-CM

## 2021-09-15 DIAGNOSIS — Z638 Other specified problems related to primary support group: Secondary | ICD-10-CM

## 2021-09-15 DIAGNOSIS — R4182 Altered mental status, unspecified: Secondary | ICD-10-CM

## 2021-09-15 DIAGNOSIS — Y835 Amputation of limb(s) as the cause of abnormal reaction of the patient, or of later complication, without mention of misadventure at the time of the procedure: Secondary | ICD-10-CM | POA: Diagnosis present

## 2021-09-15 DIAGNOSIS — T8743 Infection of amputation stump, right lower extremity: Secondary | ICD-10-CM | POA: Diagnosis present

## 2021-09-15 DIAGNOSIS — A419 Sepsis, unspecified organism: Secondary | ICD-10-CM | POA: Diagnosis not present

## 2021-09-15 LAB — COMPREHENSIVE METABOLIC PANEL
ALT: <5 U/L (ref 0–44)
AST: 38 U/L (ref 15–41)
Albumin: 1.7 g/dL — ABNORMAL LOW (ref 3.5–5.0)
Alkaline Phosphatase: 141 U/L — ABNORMAL HIGH (ref 38–126)
Anion gap: 24 — ABNORMAL HIGH (ref 5–15)
BUN: 26 mg/dL — ABNORMAL HIGH (ref 6–20)
CO2: 21 mmol/L — ABNORMAL LOW (ref 22–32)
Calcium: 8.6 mg/dL — ABNORMAL LOW (ref 8.9–10.3)
Chloride: 92 mmol/L — ABNORMAL LOW (ref 98–111)
Creatinine, Ser: 5.39 mg/dL — ABNORMAL HIGH (ref 0.44–1.00)
GFR, Estimated: 10 mL/min — ABNORMAL LOW (ref >60)
Glucose, Bld: 130 mg/dL — ABNORMAL HIGH (ref 70–99)
Potassium: 4.8 mmol/L (ref 3.5–5.1)
Sodium: 137 mmol/L (ref 135–145)
Total Bilirubin: 1 mg/dL (ref 0.3–1.2)
Total Protein: 8 g/dL (ref 6.5–8.1)

## 2021-09-15 LAB — CBC WITH DIFFERENTIAL/PLATELET
Abs Immature Granulocytes: 0.34 10*3/uL — ABNORMAL HIGH (ref 0.00–0.07)
Basophils Absolute: 0.1 10*3/uL (ref 0.0–0.1)
Basophils Relative: 0 %
Eosinophils Absolute: 0 10*3/uL (ref 0.0–0.5)
Eosinophils Relative: 0 %
HCT: 31 % — ABNORMAL LOW (ref 36.0–46.0)
Hemoglobin: 9.5 g/dL — ABNORMAL LOW (ref 12.0–15.0)
Immature Granulocytes: 2 %
Lymphocytes Relative: 4 %
Lymphs Abs: 0.6 10*3/uL — ABNORMAL LOW (ref 0.7–4.0)
MCH: 27.2 pg (ref 26.0–34.0)
MCHC: 30.6 g/dL (ref 30.0–36.0)
MCV: 88.8 fL (ref 80.0–100.0)
Monocytes Absolute: 0.7 10*3/uL (ref 0.1–1.0)
Monocytes Relative: 4 %
Neutro Abs: 16.2 10*3/uL — ABNORMAL HIGH (ref 1.7–7.7)
Neutrophils Relative %: 90 %
Platelets: 260 10*3/uL (ref 150–400)
RBC: 3.49 MIL/uL — ABNORMAL LOW (ref 3.87–5.11)
RDW: 20.9 % — ABNORMAL HIGH (ref 11.5–15.5)
WBC: 17.9 10*3/uL — ABNORMAL HIGH (ref 4.0–10.5)
nRBC: 3.2 % — ABNORMAL HIGH (ref 0.0–0.2)

## 2021-09-15 LAB — GLUCOSE, CAPILLARY
Glucose-Capillary: 175 mg/dL — ABNORMAL HIGH (ref 70–99)
Glucose-Capillary: 209 mg/dL — ABNORMAL HIGH (ref 70–99)

## 2021-09-15 LAB — RESP PANEL BY RT-PCR (FLU A&B, COVID) ARPGX2
Influenza A by PCR: NEGATIVE
Influenza B by PCR: NEGATIVE
SARS Coronavirus 2 by RT PCR: NEGATIVE

## 2021-09-15 LAB — ETHANOL: Alcohol, Ethyl (B): <10 mg/dL (ref <10)

## 2021-09-15 MED ORDER — LIDOCAINE-PRILOCAINE 2.5-2.5 % EX CREA
1.0000 "application " | TOPICAL_CREAM | CUTANEOUS | Status: DC
  Filled 2021-09-15: qty 5

## 2021-09-15 MED ORDER — INSULIN ASPART 100 UNIT/ML IJ SOLN
0.0000 [IU] | INTRAMUSCULAR | Status: DC
  Administered 2021-09-15 – 2021-09-16 (×2): 1 [IU] via SUBCUTANEOUS
  Administered 2021-09-16: 01:00:00 2 [IU] via SUBCUTANEOUS
  Administered 2021-09-17 (×2): 6 [IU] via SUBCUTANEOUS
  Administered 2021-09-17: 2 [IU] via SUBCUTANEOUS
  Administered 2021-09-17 (×2): 3 [IU] via SUBCUTANEOUS
  Administered 2021-09-18: 4 [IU] via SUBCUTANEOUS
  Administered 2021-09-18 (×2): 3 [IU] via SUBCUTANEOUS
  Administered 2021-09-18: 6 [IU] via SUBCUTANEOUS
  Administered 2021-09-18: 3 [IU] via SUBCUTANEOUS
  Administered 2021-09-18: 6 [IU] via SUBCUTANEOUS
  Administered 2021-09-19: 4 [IU] via SUBCUTANEOUS
  Administered 2021-09-19 (×2): 3 [IU] via SUBCUTANEOUS
  Administered 2021-09-19 (×2): 1 [IU] via SUBCUTANEOUS
  Administered 2021-09-20 (×2): 2 [IU] via SUBCUTANEOUS
  Administered 2021-09-20 – 2021-09-21 (×3): 1 [IU] via SUBCUTANEOUS

## 2021-09-15 MED ORDER — RENA-VITE PO TABS
1.0000 | ORAL_TABLET | Freq: Every day | ORAL | Status: DC
  Filled 2021-09-15: qty 1

## 2021-09-15 MED ORDER — RENA-VITE PO TABS
1.0000 | ORAL_TABLET | Freq: Every day | ORAL | Status: DC
  Administered 2021-09-15 – 2021-09-23 (×7): 1 via ORAL
  Filled 2021-09-15 (×8): qty 1

## 2021-09-15 MED ORDER — CINACALCET HCL 30 MG PO TABS
90.0000 mg | ORAL_TABLET | Freq: Every day | ORAL | Status: DC
  Administered 2021-09-16 – 2021-09-18 (×2): 90 mg via ORAL
  Filled 2021-09-15 (×4): qty 3

## 2021-09-15 MED ORDER — CINACALCET HCL 30 MG PO TABS
90.0000 mg | ORAL_TABLET | Freq: Every day | ORAL | Status: DC
  Filled 2021-09-15 (×2): qty 3

## 2021-09-15 MED ORDER — SODIUM CHLORIDE 0.9 % IV SOLN
1.0000 g | INTRAVENOUS | Status: DC
  Filled 2021-09-15: qty 1

## 2021-09-15 MED ORDER — HEPARIN SODIUM (PORCINE) 5000 UNIT/ML IJ SOLN: 5000.0000 [IU] | Freq: Three times a day (TID) | INTRAMUSCULAR | Status: DC

## 2021-09-15 MED ORDER — LOSARTAN POTASSIUM 50 MG PO TABS
50.0000 mg | ORAL_TABLET | Freq: Every day | ORAL | Status: DC
  Administered 2021-09-15 – 2021-09-19 (×5): 50 mg via ORAL
  Filled 2021-09-15 (×5): qty 1

## 2021-09-15 MED ORDER — DORZOLAMIDE HCL-TIMOLOL MAL 2-0.5 % OP SOLN
1.0000 [drp] | Freq: Two times a day (BID) | OPHTHALMIC | Status: DC
  Administered 2021-09-15 – 2021-10-25 (×76): 1 [drp] via OPHTHALMIC
  Filled 2021-09-15 (×3): qty 10

## 2021-09-15 MED ORDER — CLINDAMYCIN PHOSPHATE 600 MG/50ML IV SOLN
600.0000 mg | Freq: Once | INTRAVENOUS | Status: DC
  Filled 2021-09-15: qty 50

## 2021-09-15 MED ORDER — VANCOMYCIN HCL IN DEXTROSE 1-5 GM/200ML-% IV SOLN
1000.0000 mg | Freq: Once | INTRAVENOUS | Status: AC
  Administered 2021-09-15: 1000 mg via INTRAVENOUS
  Filled 2021-09-15: qty 200

## 2021-09-15 MED ORDER — SODIUM CHLORIDE 0.9 % IV SOLN
1.0000 g | INTRAVENOUS | Status: DC
  Administered 2021-09-15 – 2021-09-18 (×4): 1 g via INTRAVENOUS
  Filled 2021-09-15 (×5): qty 1

## 2021-09-15 MED ORDER — HEPARIN SODIUM (PORCINE) 5000 UNIT/ML IJ SOLN
5000.0000 [IU] | Freq: Three times a day (TID) | INTRAMUSCULAR | Status: DC
  Administered 2021-09-15 – 2021-10-09 (×67): 5000 [IU] via SUBCUTANEOUS
  Filled 2021-09-15 (×65): qty 1

## 2021-09-15 MED ORDER — METRONIDAZOLE 500 MG PO TABS
500.0000 mg | ORAL_TABLET | Freq: Two times a day (BID) | ORAL | Status: DC
  Administered 2021-09-15 – 2021-09-18 (×7): 500 mg via ORAL
  Filled 2021-09-15 (×7): qty 1

## 2021-09-15 MED ORDER — LOSARTAN POTASSIUM 50 MG PO TABS: 50.0000 mg | ORAL_TABLET | Freq: Every day | ORAL | Status: DC

## 2021-09-15 MED ORDER — CLINDAMYCIN PHOSPHATE 600 MG/50ML IV SOLN
600.0000 mg | Freq: Once | INTRAVENOUS | Status: AC
  Administered 2021-09-15: 600 mg via INTRAVENOUS

## 2021-09-15 MED ORDER — CALCITRIOL 0.25 MCG PO CAPS
0.2500 ug | ORAL_CAPSULE | Freq: Two times a day (BID) | ORAL | Status: DC
  Administered 2021-09-15 – 2021-09-20 (×11): 0.25 ug via ORAL
  Filled 2021-09-15 (×17): qty 1

## 2021-09-15 MED ORDER — SEVELAMER CARBONATE 0.8 G PO PACK
1.6000 g | PACK | Freq: Three times a day (TID) | ORAL | Status: DC
  Administered 2021-09-16 – 2021-09-23 (×11): 1.6 g via ORAL
  Filled 2021-09-15 (×22): qty 2

## 2021-09-15 MED ORDER — OXYCODONE-ACETAMINOPHEN 5-325 MG PO TABS
1.0000 | ORAL_TABLET | Freq: Four times a day (QID) | ORAL | Status: DC | PRN
  Administered 2021-09-16 – 2021-09-24 (×5): 1 via ORAL
  Filled 2021-09-15 (×6): qty 1

## 2021-09-15 MED ORDER — VANCOMYCIN HCL 750 MG/150ML IV SOLN
750.0000 mg | INTRAVENOUS | Status: DC
  Administered 2021-09-16: 750 mg via INTRAVENOUS
  Filled 2021-09-15 (×2): qty 150

## 2021-09-15 NOTE — ED Triage Notes (Signed)
Pt bib [facility] from Dialysis center d/t lethargy and AMS. Afebrile . Completed the whole treatment.   BP 130/70 HR 80  RR 16  Cbg 138  Temp 97.5

## 2021-09-15 NOTE — ED Provider Notes (Signed)
[place] EMERGENCY DEPARTMENT Provider Note   CSN: [id number] Arrival date & time: 09/15/21  1424     History  No chief complaint on file.   [name] is a 41 y.o. female.  HPI Patient presents after completing a dialysis session, with staff concern for altered mental status.  The patient herself does not interact substantially, does not offer any details of her history.  Details are obtained by EMS note, chart review, and via conversation with her vascular surgeon.  Seemingly the patient completed a full dialysis session, but after she was not speaking normally subsequently, she was in here for evaluation.  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    Home Medications Prior to Admission medications   Medication Sig Start Date End Date Taking? Authorizing Provider  Accu-Chek FastClix Lancets MISC Use as instructed to check blood sugar up to TID. E11.22 Patient taking differently: 1 each by Other route See admin instructions. Use as instructed to check blood sugar up to TID. E11.22 04/15/19   Charlott Rakes, MD  Biotin w/ Vitamins C & E (HAIR/SKIN/NAILS PO) Take 1 capsule by mouth daily.    [provider]  Blood Glucose Monitoring Suppl (ACCU-CHEK GUIDE ME) w/Device KIT 1 kit by Does not apply route 3 (three) times daily. Use to check BG at home up to 3 times daily. E11.22 04/15/19   Charlott Rakes, MD  calcitRIOL (ROCALTROL) 0.25 MCG capsule Take 0.25 mcg by mouth 2 (two) times daily.     [provider]  ceFAZolin (ANCEF) IVPB Inject 2 g into the vein Every Tuesday,Thursday,and Saturday with dialysis. Indication:  osteomyelitis First Dose: No Last Day of Therapy:  09/21/21 with dialysis Labs - Once weekly:  CBC/D and BMP, Labs - Every other week:  ESR and CRP Method of administration: IV Push Method of administration may be changed at the discretion of home infusion pharmacist based upon  assessment of the patient and/or caregiver's ability to self-administer the medication ordered. 08/24/21 09/22/21  Aline August, MD  cinacalcet (SENSIPAR) 90 MG tablet Take 90 mg by mouth daily.  09/22/19   [provider]  Continuous Blood Gluc Receiver (DEXCOM G6 RECEIVER) DEVI 1 Device by Does not apply route as directed. 09/12/20   Shamleffer, Melanie Crazier, MD  Continuous Blood Gluc Sensor (DEXCOM G6 SENSOR) MISC 1 Device by Does not apply route as directed. 09/12/20   Shamleffer, Melanie Crazier, MD  Continuous Blood Gluc Transmit (DEXCOM G6 TRANSMITTER) MISC 1 Device by Does not apply route as directed. 09/12/20   Shamleffer, Melanie Crazier, MD  dorzolamide-timolol (COSOPT) 22.3-6.8 MG/ML ophthalmic solution Place 1 drop into the left eye 2 (two) times daily. 03/30/21   [provider]  glucose blood (ACCU-CHEK GUIDE) test strip Use as instructed to check blood sugar up to TID. E11.22 Patient taking differently: 1 each by Other route See admin instructions. Use as instructed to check blood sugar up to TID. E11.22 07/17/19   Fulp, Cammie, MD  Insulin Pen Needle 32G X 4 MM MISC 1 Device by Does not apply route as directed. 05/13/20   Shamleffer, Melanie Crazier, MD  lidocaine-prilocaine (EMLA) cream Apply 1 application topically as directed.  09/07/19   [provider]  losartan (COZAAR) 50 MG tablet Take 1 tablet (50 mg total) by mouth daily. 07/02/20   Pokhrel, Corrie Mckusick, MD  multivitamin (RENA-VIT) TABS tablet Take 1 tablet by mouth daily. 07/18/19   Fulp,  MD  °oxyCODONE-acetaminophen (PERCOCET) 5-325 MG tablet Take 1 tablet by mouth every 6 (six) hours as needed for severe pain. 09/04/21   Collins, Emma M, PA-C  °sevelamer carbonate (RENVELA) 0.8 g PACK packet Take 1.6 g by mouth 3 (three) times daily with meals. 08/24/21 09/23/21  Alekh, Kshitiz, MD  °   ° °Allergies    °Morphine, Peanut-containing drug products, Penicillins, Chocolate, Chocolate flavor, and Peanut  (diagnostic)   ° °Review of Systems   °Review of Systems  °Unable to perform ROS: Acuity of condition  ° °Physical Exam °Updated Vital Signs °BP 125/72    Pulse 99    Resp 11    SpO2 100%  °Physical Exam °Vitals and nursing note reviewed.  °Constitutional:   °   General: She is not in acute distress. °   Appearance: She is well-developed.  °HENT:  °   Head: Normocephalic and atraumatic.  °Eyes:  °   Conjunctiva/sclera: Conjunctivae normal.  °Cardiovascular:  °   Rate and Rhythm: Normal rate and regular rhythm.  °Pulmonary:  °   Effort: Pulmonary effort is normal. No respiratory distress.  °   Breath sounds: Normal breath sounds. No stridor.  °Abdominal:  °   General: There is no distension.  °Musculoskeletal:  °     Legs: ° °Skin: °   General: Skin is warm and dry.  °Neurological:  °   Mental Status: She is alert.  °   Cranial Nerves: No cranial nerve deficit.  °   Comments: Patient is awake, alert, sitting upright, following activity and commands, but also stating that she cannot hear.  He moves all extremity spontaneously, face is symmetric, speech is brief, but clear.  Incomplete exam as the patient does not consistently follow commands, answer questions.  ° ° ° °ED Results / Procedures / Treatments   °Labs °(all labs ordered are listed, but only abnormal results are displayed) °Labs Reviewed  °COMPREHENSIVE METABOLIC PANEL - Abnormal; Notable for the following components:  °    Result Value  ° Chloride 92 (*)   ° CO2 21 (*)   ° Glucose, Bld 130 (*)   ° BUN 26 (*)   ° Creatinine, Ser 5.39 (*)   ° Calcium 8.6 (*)   ° Albumin 1.7 (*)   ° Alkaline Phosphatase 141 (*)   ° GFR, Estimated 10 (*)   ° Anion gap 24 (*)   ° All other components within normal limits  °CBC WITH DIFFERENTIAL/PLATELET - Abnormal; Notable for the following components:  ° WBC 17.9 (*)   ° RBC 3.49 (*)   ° Hemoglobin 9.5 (*)   ° HCT 31.0 (*)   ° RDW 20.9 (*)   ° nRBC 3.2 (*)   ° Neutro Abs 16.2 (*)   ° Lymphs Abs 0.6 (*)   ° Abs Immature  Granulocytes 0.34 (*)   ° All other components within normal limits  °RESP PANEL BY RT-PCR (FLU A&B, COVID) ARPGX2  °ETHANOL  °PATHOLOGIST SMEAR REVIEW  ° ° °EKG °EKG Interpretation ° °Date/Time:  Friday September 15 2021 14:35:04 EST °Ventricular Rate:  98 °PR Interval:  128 °QRS Duration: 78 °QT Interval:  357 °QTC Calculation: 456 °R Axis:   52 °Text Interpretation: Sinus rhythm LAE, consider biatrial enlargement Probable LVH with secondary repol abnrm Abnormal ECG Confirmed by ,  ([id number]) on 09/15/2021 2:45:57 PM ° °Radiology °DG Foot Complete Right ° °Result Date: 09/15/2021 °CLINICAL DATA:  Recent amputation and nonhealing wound with drainage. Evaluate for   possible osteomyelitis. EXAM: RIGHT FOOT COMPLETE - 3+ VIEW COMPARISON:  Right foot radiographs dated August 26, 2021 FINDINGS: Recent amputation through the bases of the metatarsals. Compared to recent postsurgical radiograph there is interval development of indistinct margins about the amputation site of the base of the metatarsals and multiple small cortical erosions, which in the presence of nonhealing wound is highly suspicious for osteomyelitis. There is soft tissue edema and likely multiple small pockets of gas about the surgical wound. IMPRESSION: Interval development of cortical erosions about the amputation margins, which in the presence of nonhealing wound is highly suspicious for acute osteomyelitis. MRI examination could be considered for further evaluation if clinically warranted. Electronically Signed   By: Imran  Ahmed D.O.   On: 09/15/2021 15:29   ° °Procedures °Procedures  ° ° °Medications Ordered in ED °Medications - No data to display ° °ED Course/ Medical Decision Making/ A&P °This patient presents to the ED for concern of altered mental status, this involves an extensive number of treatment options, and is a complaint that carries with it a high risk of complications and morbidity.  The differential diagnosis includes post  dialysis encephalopathy versus bacteremia, versus sepsis given her recent vascular procedure. ° ° °Co morbidities that complicate the patient evaluation ° °End-stage renal disease, prior encephalopathy, PTSD, gangrenous foot ° ° °Social Determinants of Health: ° °End-stage renal disease, blindness ° ° °Additional history obtained: ° °Additional history and/or information obtained from chart, vascular surgeon °External records from outside source obtained and reviewed including as above, transmetatarsal amputation 1 month ago, with at least 1 visit in the interim.  Notably, the patient has also had evaluation during which some consideration of her inability to care for her children was discussed with social work, child protective services.  Resolution of this is unclear ° ° °After the initial evaluation, orders, including: X-ray, head CT, labs were initiated.  Case discussed with vascular surgery who will follow as a consulting service, Dr. Brabham ° °Patient placed on Cardiac and Pulse-Oximetry Monitors. °The patient was maintained on a cardiac monitor.  The cardiac monitored showed an rhythm of cardiac 90s sinus normal °The patient was also maintained on pulse oximetry. The readings were typically 99% room air ° °On repeat evaluation of the patient stayed the same ° °Lab Tests: ° °I personally interpreted labs.  The pertinent results include: Leukocytosis consistent with concern for infection, chemistry consistent with dialysis need, end-stage renal disease ° °Imaging Studies ordered: ° °I independently visualized and interpreted imaging which showed concern for osteomyelitis °I agree with the radiologist interpretation ° °Consultations Obtained: ° °I requested consultation with the vascular surgery,  and discussed lab and imaging findings as well as pertinent plan - they recommend: Admission for possible below the knee amputation, ongoing broad-spectrum antibiotics ° °Dispostion / Final MDM: ° °After consideration  of the diagnostic results and the patient's response to treatment, she will require admission, initiation of broad-spectrum antibiotics, admission to our hospitalist team with vascular surgery consulting secondary to concern for osteomyelitis, wound infection in the context of patient is relatively immunocompromise secondary to her end-stage renal disease.  History some limited secondary to the patient's unwillingness to talk, but this is seemingly per baseline after discussion with vascular surgery and chart review. ° ° °Final Clinical Impression(s) / ED Diagnoses °Final diagnoses:  °Osteomyelitis of right foot, unspecified type (HCC)  ° °CRITICAL CARE °Performed by: Cicily Bonano °Total critical care time: 35 minutes °Critical care time was exclusive of separately billable procedures   and treating other patients. °Critical care was necessary to treat or prevent imminent or life-threatening deterioration. °Critical care was time spent personally by me on the following activities: development of treatment plan with patient and/or surrogate as well as nursing, discussions with consultants, evaluation of patient's response to treatment, examination of patient, obtaining history from patient or surrogate, ordering and performing treatments and interventions, ordering and review of laboratory studies, ordering and review of radiographic studies, pulse oximetry and re-evaluation of patient's condition. ° °  °, , MD °09/15/21 1633 ° °

## 2021-09-15 NOTE — Telephone Encounter (Signed)
Lauren with [facility] called.  Pt missed her [place] visit and is not returning phone calls.  Per Ander Purpura, "patient is not in any of the local hospitals".  I also tried calling all numbers on file and left a voice message on numbers that had voicemail setup.

## 2021-09-15 NOTE — H&P (Addendum)
History and Physical    [name] BDZ:[id number] DOB: 06-03-1981 DOA: 09/15/2021  PCP: [facility]  Patient coming from: dialysis   Chief Complaint: altered MS  HPI: [name] is a 41 y.o. female with medical history significant for DMT2, ESRD with HD on Tu,Th, Sat, neuropathy, HTN with poor compliance with medical treatment and HD was sent after completing dialysis session today due to staff concern for altered mental status.  She was not speaking to them.  Details are obtained by EMS note, chart review and ER note.  During my visit patient was awake and oriented.  Every time I asked her a question for she would say she can hear even while I was speaking to her almost in a very loud voice she still would tell me she cannot hear.  When the nurse walked in and said yes you can hear, at that point patient miraculously could hear me with an normal tone conversation. Apparently she also did this to the ER attending.  She denies pain, sob, cp, dizziness. She doesn't really complain of everything. MS appears to have improved.  Off note: patient was recently discharged last month after being admitted for Rt foot pain and infection. Found with gangrenous toes, underwent angiogram and urgent transmetatarsal amputation. She subsequently underwent further surgical intervention by vascular surgery with wound VAC placement and subsequent removal of wound VAC on 08/18/2021.  Antibiotics have been changed to a IV Ancef with wound cultures growing MSSA.  Vascular surgery has cleared the patient for discharge with close outpatient follow-up with vascular surgery for need for possible further proximal amputation.   ED Course: Blood pressure 139/74, pulse 98, respiratory rate 13, pulse 99, O2 sat 100%.  CO2 21, glucose 130, BUN 26, creatinine 5.39, calcium 8.6, anion gap 24, alkaline phosphatase 141, albumin 1.7, WBC 17.9 (chronically with elevated WBC however more than before),  hemoglobin 9.5, hematocrit 31, platelets 260 EKG personally reviewed sinus rhythm with nonspecific ST changes  Foot x-ray personally reviewed reveals interval development of cortical erosions about the amputation margins, presence of nonhealing wound is highly suspicious for acute osteomyelitis.  MRI recommended.  Covid test negative  ER MD spoke to vascular surgery Dr Trula Slade who recommended admission and likely needs below the knee amputation. Started on iv abx -clindamycin x1 and vancomycin.    TRH to admit  Review of Systems: All systems reviewed and otherwise negative.    Past Medical History:  Diagnosis Date   Anemia    Anxiety    Blind right eye 2008   Diabetes mellitus without complication ([place])    Dialysis patient ([place])    ESRD (end stage renal disease) ([place])    Dialysis T/Th/Sa    Past Surgical History:  Procedure Laterality Date   AMPUTATION Right 08/11/2021   Procedure: TRANSMETATARSAL AMPUTATION OF RIGHT FOOT;  Surgeon: Serafina Mitchell, MD;  Location: [place];  Service: Vascular;  Laterality: Right;   AMPUTATION TOE Left    APPLICATION OF WOUND VAC  08/16/2021   Procedure: APPLICATION OF WOUND VAC;  Surgeon: Serafina Mitchell, MD;  Location: [place];  Service: Vascular;;   AV FISTULA PLACEMENT     CESAREAN SECTION  2011   FISTULA SUPERFICIALIZATION Right 34/19/6222   Procedure: 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;  Surgeon: Angelia Mould, MD;  Location: [place];  Service: Vascular;  Laterality: Right;   INSERTION OF DIALYSIS CATHETER Left 07/27/2019   Procedure: INSERTION OF TUNNELED  DIALYSIS CATHETER;  Surgeon: Angelia Mould, MD;  Location: [place];  Service: Vascular;  Laterality: Left;   LOWER EXTREMITY ANGIOGRAPHY N/A 08/11/2021   Procedure: LOWER EXTREMITY ANGIOGRAPHY;  Surgeon: Serafina Mitchell, MD;  Location: [place] [facility];  Service: Cardiovascular;  Laterality: N/A;   TRANSMETATARSAL AMPUTATION Right 08/16/2021    Procedure: CLOSURE OF RIGHT TRANSMETATARSAL AMPUTATION;  Surgeon: Serafina Mitchell, MD;  Location: [place];  Service: Vascular;  Laterality: Right;     reports that she has never smoked. She has never used smokeless tobacco. She reports that she does not drink alcohol and does not use drugs.  Allergies  Allergen Reactions   Morphine Rash and Other (See Comments)   Peanut-Containing Drug Products Anaphylaxis and Hives   Penicillins Rash    Rash in 2008.  Tolerated cefazolin in 2020   Chocolate Hives   Chocolate Flavor Hives   Peanut (Diagnostic) Hives    Family History  Problem Relation Age of Onset   Diabetes Mother    Diabetes Father      Prior to Admission medications   Medication Sig Start Date End Date Taking? Authorizing Provider  Accu-Chek FastClix Lancets MISC Use as instructed to check blood sugar up to TID. E11.22 Patient taking differently: 1 each by Other route See admin instructions. Use as instructed to check blood sugar up to TID. E11.22 04/15/19   Charlott Rakes, MD  Biotin w/ Vitamins C & E (HAIR/SKIN/NAILS PO) Take 1 capsule by mouth daily.    [provider]  Blood Glucose Monitoring Suppl (ACCU-CHEK GUIDE ME) w/Device KIT 1 kit by Does not apply route 3 (three) times daily. Use to check BG at home up to 3 times daily. E11.22 04/15/19   Charlott Rakes, MD  calcitRIOL (ROCALTROL) 0.25 MCG capsule Take 0.25 mcg by mouth 2 (two) times daily.     [provider]  ceFAZolin (ANCEF) IVPB Inject 2 g into the vein Every Tuesday,Thursday,and Saturday with dialysis. Indication:  osteomyelitis First Dose: No Last Day of Therapy:  09/21/21 with dialysis Labs - Once weekly:  CBC/D and BMP, Labs - Every other week:  ESR and CRP Method of administration: IV Push Method of administration may be changed at the discretion of home infusion pharmacist based upon assessment of the patient and/or caregiver's ability to self-administer the medication ordered. 08/24/21  09/22/21  Aline August, MD  cinacalcet (SENSIPAR) 90 MG tablet Take 90 mg by mouth daily.  09/22/19   [provider]  Continuous Blood Gluc Receiver (DEXCOM G6 RECEIVER) DEVI 1 Device by Does not apply route as directed. 09/12/20   Shamleffer, Melanie Crazier, MD  Continuous Blood Gluc Sensor (DEXCOM G6 SENSOR) MISC 1 Device by Does not apply route as directed. 09/12/20   Shamleffer, Melanie Crazier, MD  Continuous Blood Gluc Transmit (DEXCOM G6 TRANSMITTER) MISC 1 Device by Does not apply route as directed. 09/12/20   Shamleffer, Melanie Crazier, MD  dorzolamide-timolol (COSOPT) 22.3-6.8 MG/ML ophthalmic solution Place 1 drop into the left eye 2 (two) times daily. 03/30/21   [provider]  glucose blood (ACCU-CHEK GUIDE) test strip Use as instructed to check blood sugar up to TID. E11.22 Patient taking differently: 1 each by Other route See admin instructions. Use as instructed to check blood sugar up to TID. E11.22 07/17/19   Fulp, Cammie, MD  Insulin Pen Needle 32G X 4 MM MISC 1 Device by Does not apply route as directed. 05/13/20   Shamleffer, Melanie Crazier, MD  lidocaine-prilocaine (EMLA) cream Apply 1 application topically as directed.  09/07/19   [provider]  losartan (COZAAR) 50 MG tablet Take 1 tablet (50 mg total) by mouth daily. 07/02/20   Pokhrel, Corrie Mckusick, MD  multivitamin (RENA-VIT) TABS tablet Take 1 tablet by mouth daily. 07/18/19   Fulp, Cammie, MD  oxyCODONE-acetaminophen (PERCOCET) 5-325 MG tablet Take 1 tablet by mouth every 6 (six) hours as needed for severe pain. 09/04/21   Ulyses Amor, PA-C  sevelamer carbonate (RENVELA) 0.8 g PACK packet Take 1.6 g by mouth 3 (three) times daily with meals. 08/24/21 09/23/21  Aline August, MD    Physical Exam: Vitals:   09/15/21 1528 09/15/21 1704 09/15/21 1706 09/15/21 1706  BP:  109/72    Pulse:      Resp:  13 13   Temp: (!) 97.4 F (36.3 C)     TempSrc: Oral     SpO2:    100%    Constitutional: NAD,  calm, comfortable Vitals:   09/15/21 1528 09/15/21 1704 09/15/21 1706 09/15/21 1706  BP:  109/72    Pulse:      Resp:  13 13   Temp: (!) 97.4 F (36.3 C)     TempSrc: Oral     SpO2:    100%    Eyes: EOMI ENMT: Mucous membranes are moist.  Neck: normal, supple Respiratory: clear to auscultation bilaterally, no wheezing, no crackles. Normal respiratory effort. No accessory muscle use.  Cardiovascular: Regular rate and rhythm, no murmurs / rubs / gallops. Abdomen: no tenderness, no masses palpated. Bowel sounds positive.  Musculoskeletal: no edema. Rt foot at amputation site draining serosanguineous fluid very small amount.  Denies pain on palpation..  Skin: warm, dry Neurologic: difficult to assess due to pt's lack of cooperation with exam. Strength 5/5 UE, moves lower extremity but does not participate in strenghth assessment aaxoxo3. No facial droop.  Psychiatric: mood and affect appropriate in current setting   Labs on Admission: I have personally reviewed following labs and imaging studies  CBC: Recent Labs  Lab 09/15/21 1547  WBC 17.9*  NEUTROABS 16.2*  HGB 9.5*  HCT 31.0*  MCV 88.8  PLT 253   Basic Metabolic Panel: Recent Labs  Lab 09/15/21 1547  NA 137  K 4.8  CL 92*  CO2 21*  GLUCOSE 130*  BUN 26*  CREATININE 5.39*  CALCIUM 8.6*   GFR: Estimated Creatinine Clearance: 11.7 mL/min (A) (by C-G formula based on SCr of 5.39 mg/dL (H)). Liver Function Tests: Recent Labs  Lab 09/15/21 1547  AST 38  ALT <5  ALKPHOS 141*  BILITOT 1.0  PROT 8.0  ALBUMIN 1.7*   No results for input(s): LIPASE, AMYLASE in the last 168 hours. No results for input(s): AMMONIA in the last 168 hours. Coagulation Profile: No results for input(s): INR, PROTIME in the last 168 hours. Cardiac Enzymes: No results for input(s): CKTOTAL, CKMB, CKMBINDEX, TROPONINI in the last 168 hours. BNP (last 3 results) No results for input(s): PROBNP in the last 8760 hours. HbA1C: No results  for input(s): HGBA1C in the last 72 hours. CBG: No results for input(s): GLUCAP in the last 168 hours. Lipid Profile: No results for input(s): CHOL, HDL, LDLCALC, TRIG, CHOLHDL, LDLDIRECT in the last 72 hours. Thyroid Function Tests: No results for input(s): TSH, T4TOTAL, FREET4, T3FREE, THYROIDAB in the last 72 hours. Anemia Panel: No results for input(s): VITAMINB12, FOLATE, FERRITIN, TIBC, IRON, RETICCTPCT in the last 72 hours. Urine analysis:    Component  Value Date/Time   COLORURINE YELLOW 02/21/2019 1955   APPEARANCEUR TURBID (A) 02/21/2019 1955   LABSPEC 1.012 02/21/2019 1955   PHURINE 7.0 02/21/2019 1955   GLUCOSEU >=500 (A) 02/21/2019 1955   HGBUR MODERATE (A) 02/21/2019 [place] NEGATIVE 02/21/2019 [place] NEGATIVE 02/21/2019 1955   PROTEINUR 100 (A) 02/21/2019 1955   NITRITE NEGATIVE 02/21/2019 1955   LEUKOCYTESUR MODERATE (A) 02/21/2019 1955    Radiological Exams on Admission: DG Foot Complete Right  Result Date: 09/15/2021 CLINICAL DATA:  Recent amputation and nonhealing wound with drainage. Evaluate for possible osteomyelitis. EXAM: RIGHT FOOT COMPLETE - 3+ VIEW COMPARISON:  Right foot radiographs dated August 26, 2021 FINDINGS: Recent amputation through the bases of the metatarsals. Compared to recent postsurgical radiograph there is interval development of indistinct margins about the amputation site of the base of the metatarsals and multiple small cortical erosions, which in the presence of nonhealing wound is highly suspicious for osteomyelitis. There is soft tissue edema and likely multiple small pockets of gas about the surgical wound. IMPRESSION: Interval development of cortical erosions about the amputation margins, which in the presence of nonhealing wound is highly suspicious for acute osteomyelitis. MRI examination could be considered for further evaluation if clinically warranted. Electronically Signed   By: Keane Police D.O.   On: 09/15/2021  15:29      Assessment/Plan Principal Problem:   Osteomyelitis ([place])    Altered MS-POA Resolved by the time I saw her.  Etiology unclear. Certainly possibly foot infection may play role but now MS resolved.  2. Rt foot infection POA X-ray concerning for osteomyelitis, recommended MRI MRI ordered and pending Vascular surgery to see patient over the weekend with possible amputation planning We will start on vancomycin, cefepime and metronidazole Will ck wound culture and bcx  Afebrile. Wbc mildly elevated from baseline. May consider ID consult depending on MRI result   3. ESRD POA Had HD today Nephrology consulted  4.Anemia of chronic disease POA Hg stable. At baseline  5.Diabetes mellitus type 2 with hyperglycemia POA Ck FS RISS  6.Hypoalbuminemia POA Albumin 1.7  nutrition consult  7.AG metabolic acidosis POA Likely from infection Monitor labs  Continue treatment as above  DVT prophylaxis: Heparin Code Status: Full Family Communication: None at bedside Disposition Plan: Back home Consults called: Vascular surgery and nephrology Admission status: Inpatient as patient requires more than 2 midnight stays.  Needs IV treatment and possible surgery   Nolberto Hanlon MD [facility] Pager on [place]  If 7PM-7AM, please contact night-coverage [email or web address] Password [email or web address]  09/15/2021, 5:29 PM

## 2021-09-15 NOTE — Progress Notes (Signed)
Pharmacy Antibiotic Note  [name] is a 41 y.o. female admitted on 09/15/2021 with  diabetic foot infection .  Pharmacy has been consulted for cefepime and vancomycin dosing.  WBC elevated. Of note, patient has h/o of ESRD on T/Th/Sa HD   Plan: -Cefepime 1 gm IV Q 24 hours -Vancomycin 1250 mg IV load followed by vancomycin 750 mg qHD on T/Th/Sat -Monitor CBC, cultures and clinical progress -Vanc levels as indicated      Temp (24hrs), Avg:97.4 F (36.3 C), Min:97.4 F (36.3 C), Max:97.4 F (36.3 C)  Recent Labs  Lab 09/15/21 1547  WBC PENDING    CrCl cannot be calculated (Patient's most recent lab result is older than the maximum 21 days allowed.).    Allergies  Allergen Reactions   Morphine Rash and Other (See Comments)   Peanut-Containing Drug Products Anaphylaxis and Hives   Penicillins Rash    Rash in 2008.  Tolerated cefazolin in 2020   Chocolate Hives   Chocolate Flavor Hives   Peanut (Diagnostic) Hives    Antimicrobials this admission: Vancomycin 2/3 >>  Clindamycin 2/3 >>  Cefepime 2/3 >>   Dose adjustments this admission:   Microbiology results:   Thank you for allowing pharmacy to be a part of this patients care.  Albertina Parr, PharmD., BCCCP Clinical Pharmacist Please refer to [facility] for unit-specific pharmacist

## 2021-09-15 NOTE — ED Notes (Signed)
Patient transported to MRI 

## 2021-09-15 NOTE — ED Notes (Signed)
Attempted report x 2 

## 2021-09-16 DIAGNOSIS — E1122 Type 2 diabetes mellitus with diabetic chronic kidney disease: Secondary | ICD-10-CM

## 2021-09-16 DIAGNOSIS — T8744 Infection of amputation stump, left lower extremity: Secondary | ICD-10-CM

## 2021-09-16 DIAGNOSIS — M869 Osteomyelitis, unspecified: Secondary | ICD-10-CM | POA: Diagnosis not present

## 2021-09-16 DIAGNOSIS — D638 Anemia in other chronic diseases classified elsewhere: Secondary | ICD-10-CM

## 2021-09-16 DIAGNOSIS — E872 Acidosis, unspecified: Secondary | ICD-10-CM | POA: Diagnosis not present

## 2021-09-16 DIAGNOSIS — N186 End stage renal disease: Secondary | ICD-10-CM

## 2021-09-16 DIAGNOSIS — R404 Transient alteration of awareness: Secondary | ICD-10-CM

## 2021-09-16 DIAGNOSIS — T148XXA Other injury of unspecified body region, initial encounter: Secondary | ICD-10-CM

## 2021-09-16 DIAGNOSIS — Z992 Dependence on renal dialysis: Secondary | ICD-10-CM

## 2021-09-16 LAB — SEDIMENTATION RATE: Sed Rate: >140 mm/hr — ABNORMAL HIGH (ref 0–22)

## 2021-09-16 LAB — CBC
HCT: 27.7 % — ABNORMAL LOW (ref 36.0–46.0)
Hemoglobin: 8.7 g/dL — ABNORMAL LOW (ref 12.0–15.0)
MCH: 27.4 pg (ref 26.0–34.0)
MCHC: 31.4 g/dL (ref 30.0–36.0)
MCV: 87.4 fL (ref 80.0–100.0)
Platelets: 300 10*3/uL (ref 150–400)
RBC: 3.17 MIL/uL — ABNORMAL LOW (ref 3.87–5.11)
RDW: 20.5 % — ABNORMAL HIGH (ref 11.5–15.5)
WBC: 19.9 10*3/uL — ABNORMAL HIGH (ref 4.0–10.5)
nRBC: 2 % — ABNORMAL HIGH (ref 0.0–0.2)

## 2021-09-16 LAB — PREALBUMIN: Prealbumin: 9.6 mg/dL — ABNORMAL LOW (ref 18–38)

## 2021-09-16 LAB — GLUCOSE, CAPILLARY
Glucose-Capillary: 112 mg/dL — ABNORMAL HIGH (ref 70–99)
Glucose-Capillary: 120 mg/dL — ABNORMAL HIGH (ref 70–99)
Glucose-Capillary: 136 mg/dL — ABNORMAL HIGH (ref 70–99)
Glucose-Capillary: 137 mg/dL — ABNORMAL HIGH (ref 70–99)
Glucose-Capillary: 197 mg/dL — ABNORMAL HIGH (ref 70–99)
Glucose-Capillary: 263 mg/dL — ABNORMAL HIGH (ref 70–99)

## 2021-09-16 LAB — C-REACTIVE PROTEIN: CRP: 35.2 mg/dL — ABNORMAL HIGH (ref <1.0)

## 2021-09-16 MED ORDER — NEPRO/CARBSTEADY PO LIQD
237.0000 mL | Freq: Two times a day (BID) | ORAL | Status: DC
  Administered 2021-09-16 – 2021-09-20 (×7): 237 mL via ORAL

## 2021-09-16 MED ORDER — CHLORHEXIDINE GLUCONATE CLOTH 2 % EX PADS
6.0000 | MEDICATED_PAD | Freq: Every day | CUTANEOUS | Status: DC
  Administered 2021-09-16 – 2021-09-18 (×3): 6 via TOPICAL

## 2021-09-16 NOTE — Procedures (Signed)
Patient seen on Hemodialysis. BP (!) 103/57    Pulse 82    Temp 97.6 F (36.4 C) (Oral)    Resp 18    Wt 55 kg    SpO2 100%    BMI 22.91 kg/m   QB 400, UF goal 2L Tolerating treatment without complaints at this time.   Elmarie Shiley MD [facility]. Office # [phone] Pager # [phone] 3:15 PM

## 2021-09-16 NOTE — Consult Note (Signed)
[facility]  Patient name: [name] MRN: [id number] DOB: 05-11-1981 Sex: female   REQUESTING PROVIDER:    ER   REASON FOR CONSULT:    Right TMA infection  HISTORY OF PRESENT ILLNESS:   [name] is a 41 y.o. female, who initially presented to the hospital on 08/11/2021 with a 1 week history of right foot and toe pain that had progressively gotten worse.  She was found to have gas on her x-ray and therefore she was taken urgently for surgical exploration, following angiography.  At the time of surgery she had a deep space infection across the forefoot which necessitated an open transmetatarsal amputation.  This was able to be closed several days later.  I have always been very skeptical that she would be able to heal this but we agreed to continue down this path.  She is now back for foul-smelling drainage and progressive infection.   The patient suffers from end-stage renal disease, on dialysis Tuesday Thursday Saturday.  Her last dialysis was Tuesday.  She also suffers from type 2 diabetes complicated by neuropathy.  She is noncompliant with medical treatment.  She is a non-smoker.   PAST MEDICAL HISTORY    Past Medical History:  Diagnosis Date   Anemia    Anxiety    Blind right eye 2008   Diabetes mellitus without complication ([place])    Dialysis patient ([place])    ESRD (end stage renal disease) ([place])    Dialysis T/Th/Sa     FAMILY HISTORY   Family History  Problem Relation Age of Onset   Diabetes Mother    Diabetes Father     SOCIAL HISTORY:   Social History   Socioeconomic History   Marital status: Single    Spouse name: Not on file   Number of children: Not on file   Years of education: Not on file   Highest education level: Not on file  Occupational History   Not on file  Tobacco Use   Smoking status: Never   Smokeless tobacco: Never  Vaping Use   Vaping Use: Never used  Substance  and Sexual Activity   Alcohol use: No   Drug use: No   Sexual activity: Not on file  Other Topics Concern   Not on file  Social History Narrative   Not on file   Social Determinants of Health   Financial Resource Strain: Not on file  Food Insecurity: Not on file  Transportation Needs: Not on file  Physical Activity: Not on file  Stress: Not on file  Social Connections: Not on file  Intimate Partner Violence: Not on file    ALLERGIES:    Allergies  Allergen Reactions   Morphine Rash and Other (See Comments)   Peanut-Containing Drug Products Anaphylaxis and Hives   Penicillins Rash    Rash in 2008.  Tolerated cefazolin in 2020   Chocolate Hives   Chocolate Flavor Hives   Peanut (Diagnostic) Hives    CURRENT MEDICATIONS:    Current Facility-Administered Medications  Medication Dose Route Frequency Provider Last Rate Last Admin   calcitRIOL (ROCALTROL) capsule 0.25 mcg  0.25 mcg Oral BID Nolberto Hanlon, MD   0.25 mcg at 09/16/21 0950   ceFEPIme (MAXIPIME) 1 g in sodium chloride 0.9 % 100 mL IVPB  1 g Intravenous Q24H Nolberto Hanlon, MD 200 mL/hr at 09/15/21 2247 1 g at 09/15/21 2247   Chlorhexidine Gluconate Cloth 2 % PADS 6 each  6 each  Topical Q0600 Elmarie Shiley, MD       cinacalcet [facility]) tablet 90 mg  90 mg Oral Q breakfast Nolberto Hanlon, MD       dorzolamide-timolol (COSOPT) 22.3-6.8 MG/ML ophthalmic solution 1 drop  1 drop Left Eye BID Nolberto Hanlon, MD   1 drop at 09/16/21 0946   heparin injection 5,000 Units  5,000 Units Subcutaneous Q8H Nolberto Hanlon, MD   5,000 Units at 09/16/21 1950   insulin aspart (novoLOG) injection 0-6 Units  0-6 Units Subcutaneous Q4H Nolberto Hanlon, MD   2 Units at 09/16/21 0044   lidocaine-prilocaine (EMLA) cream 1 application  1 application Topical UD Nolberto Hanlon, MD       losartan (COZAAR) tablet 50 mg  50 mg Oral Daily Nolberto Hanlon, MD   50 mg at 09/16/21 0946   metroNIDAZOLE (FLAGYL) tablet 500 mg  500 mg Oral Q12H Nolberto Hanlon, MD   500 mg  at 09/16/21 0946   multivitamin (RENA-VIT) tablet 1 tablet  1 tablet Oral Daily Nolberto Hanlon, MD   1 tablet at 09/16/21 0946   oxyCODONE-acetaminophen (PERCOCET/ROXICET) 5-325 MG per tablet 1 tablet  1 tablet Oral Q6H PRN Nolberto Hanlon, MD   1 tablet at 09/16/21 0946   sevelamer carbonate (RENVELA) packet 1.6 g  1.6 g Oral TID WC Nolberto Hanlon, MD   1.6 g at 09/16/21 0947   vancomycin (VANCOREADY) IVPB 750 mg/150 mL  750 mg Intravenous Q T,Th,Sa-HD Mancheril, Darnell Level, RPH        REVIEW OF SYSTEMS:   [X]  denotes positive finding, [ ]  denotes negative finding Cardiac  Comments:  Chest pain or chest pressure:    Shortness of breath upon exertion:    Short of breath when lying flat:    Irregular heart rhythm:        Vascular    Pain in calf, thigh, or hip brought on by ambulation:    Pain in feet at night that wakes you up from your sleep:     Blood clot in your veins:    Leg swelling:         Pulmonary    Oxygen at home:    Productive cough:     Wheezing:         Neurologic    Sudden weakness in arms or legs:     Sudden numbness in arms or legs:     Sudden onset of difficulty speaking or slurred speech:    Temporary loss of vision in one eye:     Problems with dizziness:         Gastrointestinal    Blood in stool:      Vomited blood:         Genitourinary    Burning when urinating:     Blood in urine:        Psychiatric    Major depression:         Hematologic    Bleeding problems:    Problems with blood clotting too easily:        Skin    Rashes or ulcers:        Constitutional    Fever or chills:     PHYSICAL EXAM:   Vitals:   09/15/21 2034 09/16/21 0001 09/16/21 0346 09/16/21 0725  BP: 114/68 (!) 94/39 119/65 133/61  Pulse: 94 87 96 86  Resp: 18 17 16 16   Temp: 98.3 F (36.8 C) 98.3 F (36.8 C) 99.1 F (37.3 C) 98.9 F (  37.2 C)  TempSrc: Oral  Oral Oral  SpO2: 98% 100% 100% 100%    GENERAL: The patient is a well-nourished female, in no acute  distress. The vital signs are documented above. CARDIAC: There is a regular rate and rhythm.  VASCULAR: Nonsalvageable right transmetatarsal amputation PULMONARY: Nonlabored respirations NEUROLOGIC: No focal weakness or paresthesias are detected. SKIN: There are no ulcers or rashes noted. PSYCHIATRIC: The patient has a normal affect.   STUDIES:   None  ASSESSMENT and PLAN   I discussed with the patient that I do not believe her transmetatarsal amputation is salvageable and that we will need to proceed with a below-knee amputation.  She expressed agreement with this.  I will try to get this done in the near future.  She should be n.p.o. after midnight in case I can add her on for tomorrow.   Leia Alf, MD, FACS [facility] [phone] Pager [phone]

## 2021-09-16 NOTE — Progress Notes (Signed)
Received phone call from Dr.Maynard in radiology MRI is positive for osteomyelitis in right foot full report to follow in morning.Per Dr.Maynard notify on call MD. Dr. Linda Hedges notified no new orders received at present time. Will continue with current plan of care.

## 2021-09-16 NOTE — Progress Notes (Signed)
PROGRESS NOTE    [name]  QMG:[id number] DOB: [birth year] DOA: 09/15/2021 PCP: [facility]    Brief Narrative:  [name] is a 41 y.o. female with medical history significant for DMT2, ESRD with HD on Tu,Th, Sat, neuropathy, HTN with poor compliance with medical treatment and HD was sent after completing dialysis session today due to staff concern for altered mental status.  She was not speaking to them.  Details are obtained by EMS note, chart review and ER note.  During my visit patient was awake and oriented.  Every time I asked her a question for she would say she can hear even while I was speaking to her almost in a very loud voice she still would tell me she cannot hear.  When the nurse walked in and said yes you can hear, at that point patient miraculously could hear me with an normal tone conversation. Apparently she also did this to the ER attending.  2/4 responding appropriately this am. MRI called me about pt with osteo.  Consultants:  Nephrology, vascular surgery  Procedures:   Antimicrobials:  Vancomycin metronidazole, cefepime   Subjective:  Denies foot pain, sob, cp  Objective: Vitals:   09/16/21 1335 09/16/21 1347 09/16/21 1400 09/16/21 1430  BP: 133/69 127/67 99/61 (!) 103/57  Pulse: 85 81 81 82  Resp: 18     Temp: 97.6 F (36.4 C)     TempSrc: Oral     SpO2: 100%     Weight: 55 kg       Intake/Output Summary (Last 24 hours) at 09/16/2021 1453 Last data filed at 09/16/2021 0900 Gross per 24 hour  Intake 460 ml  Output --  Net 460 ml   Filed Weights   09/16/21 1335  Weight: 55 kg    Examination: Calm, NAD Cta no w/r Reg s1/s2 no gallop Soft benign +bs No edema. Rt foot dressing in place.  Aaoxox3  Mood and affect appropriate in current setting     Data Reviewed: I have personally reviewed following labs and imaging studies  CBC: Recent Labs  Lab 09/15/21 1547 09/16/21 0941  WBC 17.9* 19.9*  NEUTROABS  16.2*  --   HGB 9.5* 8.7*  HCT 31.0* 27.7*  MCV 88.8 87.4  PLT 260 326   Basic Metabolic Panel: Recent Labs  Lab 09/15/21 1547  NA 137  K 4.8  CL 92*  CO2 21*  GLUCOSE 130*  BUN 26*  CREATININE 5.39*  CALCIUM 8.6*   GFR: Estimated Creatinine Clearance: 10.6 mL/min (A) (by C-G formula based on SCr of 5.39 mg/dL (H)). Liver Function Tests: Recent Labs  Lab 09/15/21 1547  AST 38  ALT <5  ALKPHOS 141*  BILITOT 1.0  PROT 8.0  ALBUMIN 1.7*   No results for input(s): LIPASE, AMYLASE in the last 168 hours. No results for input(s): AMMONIA in the last 168 hours. Coagulation Profile: No results for input(s): INR, PROTIME in the last 168 hours. Cardiac Enzymes: No results for input(s): CKTOTAL, CKMB, CKMBINDEX, TROPONINI in the last 168 hours. BNP (last 3 results) No results for input(s): PROBNP in the last 8760 hours. HbA1C: No results for input(s): HGBA1C in the last 72 hours. CBG: Recent Labs  Lab 09/15/21 2203 09/15/21 2356 09/16/21 0339 09/16/21 0808 09/16/21 1140  GLUCAP 175* 209* 136* 112* 137*   Lipid Profile: No results for input(s): CHOL, HDL, LDLCALC, TRIG, CHOLHDL, LDLDIRECT in the last 72 hours. Thyroid Function Tests: No results for input(s): TSH,  T4TOTAL, FREET4, T3FREE, THYROIDAB in the last 72 hours. Anemia Panel: No results for input(s): VITAMINB12, FOLATE, FERRITIN, TIBC, IRON, RETICCTPCT in the last 72 hours. Sepsis Labs: No results for input(s): PROCALCITON, LATICACIDVEN in the last 168 hours.  Recent Results (from the past 240 hour(s))  Resp Panel by RT-PCR (Flu A&B, Covid) Nasopharyngeal Swab     Status: None   Collection Time: 09/15/21  4:11 PM   Specimen: Nasopharyngeal Swab; Nasopharyngeal(NP) swabs in vial transport medium  Result Value Ref Range Status   SARS Coronavirus 2 by RT PCR NEGATIVE NEGATIVE Final    Comment: (NOTE) SARS-CoV-2 target nucleic acids are NOT DETECTED.  The SARS-CoV-2 RNA is generally detectable in upper  respiratory specimens during the acute phase of infection. The lowest concentration of SARS-CoV-2 viral copies this assay can detect is 138 copies/mL. A negative result does not preclude SARS-Cov-2 infection and should not be used as the sole basis for treatment or other patient management decisions. A negative result may occur with  improper specimen collection/handling, submission of specimen other than nasopharyngeal swab, presence of viral mutation(s) within the areas targeted by this assay, and inadequate number of viral copies(<138 copies/mL). A negative result must be combined with clinical observations, patient history, and epidemiological information. The expected result is Negative.  Fact Sheet for Patients:  EntrepreneurPulse.com.au  Fact Sheet for Healthcare Providers:  IncredibleEmployment.be  This test is no t yet approved or cleared by the [place] FDA and  has been authorized for detection and/or diagnosis of SARS-CoV-2 by FDA under an Emergency Use Authorization (EUA). This EUA will remain  in effect (meaning this test can be used) for the duration of the COVID-19 declaration under Section 564(b)(1) of the Act, 21 U.S.C.section 360bbb-3(b)(1), unless the authorization is terminated  or revoked sooner.       Influenza A by PCR NEGATIVE NEGATIVE Final   Influenza B by PCR NEGATIVE NEGATIVE Final    Comment: (NOTE) The Xpert Xpress SARS-CoV-2/FLU/RSV plus assay is intended as an aid in the diagnosis of influenza from Nasopharyngeal swab specimens and should not be used as a sole basis for treatment. Nasal washings and aspirates are unacceptable for Xpert Xpress SARS-CoV-2/FLU/RSV testing.  Fact Sheet for Patients: EntrepreneurPulse.com.au  Fact Sheet for Healthcare Providers: IncredibleEmployment.be  This test is not yet approved or cleared by the [place] FDA and has been  authorized for detection and/or diagnosis of SARS-CoV-2 by FDA under an Emergency Use Authorization (EUA). This EUA will remain in effect (meaning this test can be used) for the duration of the COVID-19 declaration under Section 564(b)(1) of the Act, 21 U.S.C. section 360bbb-3(b)(1), unless the authorization is terminated or revoked.  Performed at [facility], [place] [address], [place], [place] [address]          Radiology Studies: MR FOOT RIGHT WO CONTRAST  Result Date: 09/16/2021 CLINICAL DATA:  Status post partial amputation of the right foot. Patient presents with persistent infection. EXAM: MRI OF THE RIGHT FOREFOOT WITHOUT CONTRAST TECHNIQUE: Multiplanar, multisequence MR imaging of the right forefoot was performed. No intravenous contrast was administered. COMPARISON:  09/15/2021 FINDINGS: Bones/Joint/Cartilage Prior transmetatarsal amputation. Severe bone marrow edema in the residual metatarsal bases as well as the medial cuneiform, middle cuneiform, lateral cuneiform and distal cuboid concerning for osteomyelitis. Small area of T2 hyperintense signal in the distal lateral plantar aspect of the calcaneus with a small amount of adjacent air concerning for early osteomyelitis. Low signal foci in the navicular concerning for  air as can be seen with osteomyelitis. Mild osteoarthritis of the talonavicular joint.  Normal alignment. Ligaments Anterior and posterior talofibular ligaments are intact. Anterior and posterior tibiofibular ligaments are intact. Deltoid ligament is intact. Lisfranc ligament is intact. Muscles and Tendons Flexor, peroneal and extensor compartment tendons are intact. Achilles tendon is intact. T2 hyperintense signal throughout the plantar musculature which may be neurogenic versus secondary to mild myositis. Soft tissue Soft tissue wound at the stump of the right foot overlying the transmetatarsal amputation. Extensive soft tissue emphysema throughout the midfoot  surrounding the metatarsal bases, cuneiforms, cuboid. No soft tissue mass. IMPRESSION: 1. Prior transmetatarsal amputation. Severe bone marrow edema in the residual metatarsal bases as well as the medial cuneiform, middle cuneiform, lateral cuneiform and distal cuboid concerning for osteomyelitis. 2. Small area of T2 hyperintense signal in the distal lateral plantar aspect of the calcaneus with a small amount of intraosseous air concerning for early osteomyelitis. 3. Low signal foci in the navicular concerning for air as can be seen with osteomyelitis. 4. Air throughout the soft tissues of the midfoot concerning for a necrotizing infection. Electronically Signed   By: Kathreen Devoid M.D.   On: 09/16/2021 07:24   DG Foot Complete Right  Result Date: 09/15/2021 CLINICAL DATA:  Recent amputation and nonhealing wound with drainage. Evaluate for possible osteomyelitis. EXAM: RIGHT FOOT COMPLETE - 3+ VIEW COMPARISON:  Right foot radiographs dated August 26, 2021 FINDINGS: Recent amputation through the bases of the metatarsals. Compared to recent postsurgical radiograph there is interval development of indistinct margins about the amputation site of the base of the metatarsals and multiple small cortical erosions, which in the presence of nonhealing wound is highly suspicious for osteomyelitis. There is soft tissue edema and likely multiple small pockets of gas about the surgical wound. IMPRESSION: Interval development of cortical erosions about the amputation margins, which in the presence of nonhealing wound is highly suspicious for acute osteomyelitis. MRI examination could be considered for further evaluation if clinically warranted. Electronically Signed   By: Keane Police D.O.   On: 09/15/2021 15:29        Scheduled Meds:  calcitRIOL  0.25 mcg Oral BID   Chlorhexidine Gluconate Cloth  6 each Topical Q0600   cinacalcet  90 mg Oral Q breakfast   dorzolamide-timolol  1 drop Left Eye BID   feeding supplement  (NEPRO CARB STEADY)  237 mL Oral BID BM   heparin injection (subcutaneous)  5,000 Units Subcutaneous Q8H   insulin aspart  0-6 Units Subcutaneous Q4H   lidocaine-prilocaine  1 application Topical UD   losartan  50 mg Oral Daily   metroNIDAZOLE  500 mg Oral Q12H   multivitamin  1 tablet Oral Daily   sevelamer carbonate  1.6 g Oral TID WC   Continuous Infusions:  ceFEPime (MAXIPIME) IV 1 g (09/15/21 2247)   vancomycin      Assessment & Plan:   Principal Problem:   Osteomyelitis ([place])   Altered MS-POA Resolved by the time I saw her.  Etiology unclear. Certainly possibly foot infection may play role but now MS resolved. 2/4 at baseline now   2. Rt foot infection POA X-ray concerning for osteomyelitis, recommended MRI MRI ordered and pending Vascular surgery to see patient over the weekend with possible amputation planning 2/4 continue IV antibiotics Follow-up wound culture and blood culture We will consult ID as MRI reveals osteo please see full report   3. ESRD POA Nephrology following On HD, had hemodialysis yesterday  4.Anemia of chronic disease POA Hemoglobin at baseline    5.Diabetes mellitus type 2 with hyperglycemia POA Ck FS RISS   6.Hypoalbuminemia POA Albumin 1.7  nutrition consult   7.AG metabolic acidosis POA Likely from infection Monitor labs  Continue treatment as above     DVT prophylaxis: Heparin Code Status: Full Family Communication: family in room (but playing games on phone) Disposition Plan:  Status is: Inpatient Remains inpatient appropriate because: IV treatment.  Possible surgery.                LOS: 1 day   Time spent: 35 min     Nolberto Hanlon, MD [facility] Pager [phone]  If 7PM-7AM, please contact night-coverage 09/16/2021, 2:53 PM

## 2021-09-16 NOTE — Consult Note (Signed)
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  She was sent to the emergency room after completing dialysis yesterday (undertook hemodialysis on Friday after missing her treatment on Thursday) for changes in mental status; she was on antibiotic therapy at hemodialysis with IV Ancef for MSSA infection.  Evaluation in the emergency room was consistent with acute osteomyelitis of the right foot metatarsal bases/cuneiforms along with soft tissue appearance consistent with necrotizing infection.  Based on the admission note, will likely need right below-knee amputation for definitive management.  When seen, she denies any complaints other than feeling tired and wanting to sleep.  She reports some pain/discomfort over her right TMA site.  She denies any fever but reports chills at home.  Reports intermittent headaches.  Hemodialysis prescription: [facility], TTS, 3 hours 45 minutes, 180 dialyzer, BFR 400/DFR 600, EDW 59.5 kg, 2K/2.0 calcium, UF profile #2, right upper arm AV fistula, 15 G needles, 4000 unit bolus heparin, Mircera 200 mcg IV every 2 weeks, Ancef 2 g 3 times weekly HD  Past Medical History:  Diagnosis Date   Anemia    Anxiety    Blind right eye 2008   Diabetes mellitus without complication ([employer])    Dialysis patient ([place])    ESRD (end stage renal disease) ([place])    Dialysis T/Th/Sa    Past Surgical History:  Procedure Laterality Date   AMPUTATION Right 08/11/2021   Procedure: TRANSMETATARSAL AMPUTATION OF RIGHT FOOT;  Surgeon: Serafina Mitchell, MD;  Location: [place];  Service: Vascular;  Laterality: Right;   AMPUTATION TOE Left    APPLICATION OF WOUND VAC  08/16/2021   Procedure: APPLICATION OF WOUND VAC;  Surgeon: Serafina Mitchell, MD;  Location: [place];  Service: Vascular;;   AV FISTULA PLACEMENT     CESAREAN SECTION  2011   FISTULA SUPERFICIALIZATION Right 02/03/7627   Procedure: 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;  Surgeon: Angelia Mould, MD;  Location: [place];  Service: Vascular;  Laterality: Right;   INSERTION OF DIALYSIS CATHETER Left 07/27/2019   Procedure: INSERTION OF TUNNELED  DIALYSIS CATHETER;  Surgeon: Angelia Mould, MD;  Location: [place];  Service: Vascular;  Laterality: Left;   LOWER EXTREMITY ANGIOGRAPHY N/A 08/11/2021   Procedure: LOWER EXTREMITY ANGIOGRAPHY;  Surgeon: Serafina Mitchell, MD;  Location: [place] [facility];  Service: Cardiovascular;  Laterality: N/A;   TRANSMETATARSAL AMPUTATION Right 08/16/2021   Procedure: CLOSURE OF RIGHT TRANSMETATARSAL AMPUTATION;  Surgeon: Serafina Mitchell, MD;  Location: [facility];  Service: Vascular;  Laterality: Right;    Family History  Problem Relation Age of Onset   Diabetes Mother    Diabetes Father     Social History:  reports that she has never smoked. She has never used smokeless tobacco. She reports that she does not drink alcohol and does not use drugs.  Allergies:  Allergies  Allergen Reactions   Morphine Rash and Other (See Comments)   Peanut-Containing Drug Products Anaphylaxis and Hives   Penicillins Rash    Rash in 2008.  Tolerated cefazolin in 2020   Chocolate Hives   Chocolate Flavor Hives   Peanut (Diagnostic) Hives    Medications: I have reviewed the patient's current medications. Scheduled:  calcitRIOL  0.25 mcg  Oral BID   cinacalcet  90 mg Oral Q breakfast   dorzolamide-timolol  1 drop Left Eye BID   heparin injection (subcutaneous)  5,000 Units Subcutaneous Q8H   insulin aspart  0-6 Units Subcutaneous Q4H   lidocaine-prilocaine  1 application Topical UD   losartan  50 mg Oral Daily   metroNIDAZOLE  500 mg Oral Q12H   multivitamin  1 tablet Oral Daily   sevelamer carbonate  1.6 g Oral  TID WC    CBC Latest Ref Rng & Units 09/16/2021 09/15/2021 08/24/2021  WBC 4.0 - 10.5 K/uL 19.9(H) 17.9(H) 13.1(H)  Hemoglobin 12.0 - 15.0 g/dL 8.7(L) 9.5(L) 8.0(L)  Hematocrit 36.0 - 46.0 % 27.7(L) 31.0(L) 25.7(L)  Platelets 150 - 400 K/uL 300 260 294    BMP Latest Ref Rng & Units 09/15/2021 08/24/2021 08/22/2021  Glucose 70 - 99 mg/dL 130(H) 64(L) 125(H)  BUN 6 - 20 mg/dL 26(H) 31(H) 51(H)  Creatinine 0.44 - 1.00 mg/dL 5.39(H) 10.60(H) 12.97(H)  BUN/Creat Ratio 9 - 23 - - -  Sodium 135 - 145 mmol/L 137 140 136  Potassium 3.5 - 5.1 mmol/L 4.8 4.6 4.9  Chloride 98 - 111 mmol/L 92(L) 100 98  CO2 22 - 32 mmol/L 21(L) 26 21(L)  Calcium 8.9 - 10.3 mg/dL 8.6(L) 9.2 9.1     MR FOOT RIGHT WO CONTRAST  Result Date: 09/16/2021 CLINICAL DATA:  Status post partial amputation of the right foot. Patient presents with persistent infection. EXAM: MRI OF THE RIGHT FOREFOOT WITHOUT CONTRAST TECHNIQUE: Multiplanar, multisequence MR imaging of the right forefoot was performed. No intravenous contrast was administered. COMPARISON:  09/15/2021 FINDINGS: Bones/Joint/Cartilage Prior transmetatarsal amputation. Severe bone marrow edema in the residual metatarsal bases as well as the medial cuneiform, middle cuneiform, lateral cuneiform and distal cuboid concerning for osteomyelitis. Small area of T2 hyperintense signal in the distal lateral plantar aspect of the calcaneus with a small amount of adjacent air concerning for early osteomyelitis. Low signal foci in the navicular concerning for air as can be seen with osteomyelitis. Mild osteoarthritis of the talonavicular joint.  Normal alignment. Ligaments Anterior and posterior talofibular ligaments are intact. Anterior and posterior tibiofibular ligaments are intact. Deltoid ligament is intact. Lisfranc ligament is intact. Muscles and Tendons Flexor, peroneal and extensor compartment tendons are intact. Achilles tendon is intact. T2 hyperintense signal throughout the plantar  musculature which may be neurogenic versus secondary to mild myositis. Soft tissue Soft tissue wound at the stump of the right foot overlying the transmetatarsal amputation. Extensive soft tissue emphysema throughout the midfoot surrounding the metatarsal bases, cuneiforms, cuboid. No soft tissue mass. IMPRESSION: 1. Prior transmetatarsal amputation. Severe bone marrow edema in the residual metatarsal bases as well as the medial cuneiform, middle cuneiform, lateral cuneiform and distal cuboid concerning for osteomyelitis. 2. Small area of T2 hyperintense signal in the distal lateral plantar aspect of the calcaneus with a small amount of intraosseous air concerning for early osteomyelitis. 3. Low signal foci in the navicular concerning for air as can be seen with osteomyelitis. 4. Air throughout the soft tissues of the midfoot concerning for a necrotizing infection. Electronically Signed   By: Kathreen Devoid M.D.   On: 09/16/2021 07:24   DG Foot Complete Right  Result Date: 09/15/2021 CLINICAL DATA:  Recent amputation and nonhealing wound with drainage. Evaluate for possible osteomyelitis. EXAM: RIGHT FOOT COMPLETE - 3+ VIEW COMPARISON:  Right foot radiographs dated August 26, 2021 FINDINGS: Recent amputation through the bases of the metatarsals. Compared to recent  postsurgical radiograph there is interval development of indistinct margins about the amputation site of the base of the metatarsals and multiple small cortical erosions, which in the presence of nonhealing wound is highly suspicious for osteomyelitis. There is soft tissue edema and likely multiple small pockets of gas about the surgical wound. IMPRESSION: Interval development of cortical erosions about the amputation margins, which in the presence of nonhealing wound is highly suspicious for acute osteomyelitis. MRI examination could be considered for further evaluation if clinically warranted. Electronically Signed   By: Keane Police D.O.   On:  09/15/2021 15:29    Review of Systems  Constitutional:  Positive for chills and fatigue. Negative for appetite change and fever.  HENT:  Negative for nosebleeds, sore throat and trouble swallowing.   Eyes:  Negative for photophobia, redness and visual disturbance.  Respiratory:  Negative for cough, shortness of breath and wheezing.   Cardiovascular:  Negative for chest pain and leg swelling.  Gastrointestinal:  Negative for abdominal pain, diarrhea, nausea and vomiting.  Musculoskeletal:  Positive for myalgias.       Right leg  Skin:  Positive for wound.       Right TMA  Neurological:  Positive for weakness. Negative for dizziness, light-headedness and headaches.  Blood pressure 133/61, pulse 86, temperature 98.9 F (37.2 C), temperature source Oral, resp. rate 16, SpO2 100 %. Physical Exam Vitals and nursing note reviewed.  Constitutional:      Appearance: She is ill-appearing.     Comments: Appears chronically ill, somnolent  HENT:     Head: Normocephalic and atraumatic.     Right Ear: External ear normal.     Left Ear: External ear normal.     Nose: Nose normal.     Mouth/Throat:     Mouth: Mucous membranes are dry.     Pharynx: Oropharynx is clear.  Eyes:     Extraocular Movements: Extraocular movements intact.     Conjunctiva/sclera: Conjunctivae normal.  Cardiovascular:     Rate and Rhythm: Normal rate and regular rhythm.     Pulses: Normal pulses.  Pulmonary:     Effort: Pulmonary effort is normal.     Breath sounds: Normal breath sounds. No wheezing or rales.  Abdominal:     General: Abdomen is flat. Bowel sounds are normal.     Palpations: Abdomen is soft.  Musculoskeletal:     Cervical back: Normal range of motion and neck supple.     Right lower leg: No edema.     Left lower leg: No edema.     Comments: Right upper arm aVF  Skin:    General: Skin is warm and dry.    Assessment/Plan: 1.  Right foot osteomyelitis: Status post previous transmetatarsal  amputation and appears to have recurrence of osteomyelitis and residual tissue with likely arterial insufficiency.  On antibiotic therapy at this time with anticipated BKA in the near future. 2.  End-stage renal disease: Underwent hemodialysis yesterday to make up for missed treatment on Thursday and will schedule for hemodialysis electively again today.  She does not have any acute indications for dialysis and may be able to tolerate dialysis on 2/6 if unable to get it today. 3.  Hypertension: Blood pressures marginally elevated, will continue to monitor this especially with ultrafiltration on hemodialysis. 4.  Severe protein calorie malnutrition: Compounded by current inflammatory complex from osteomyelitis/nonhealing ulcer/wound of right TMA.  Continue renal diet with nutritional supplementation. 5.  Anemia of chronic disease: Status post Mircera  at dialysis, will continue to monitor hemoglobin and hematocrit trend anticipating further losses with surgery and ESA resistance from inflammatory complex. 6.  Chronic kidney disease-metabolic bone disease: Calcium level acceptable, will follow phosphorus level with subsequent labs.  Not on Cinacalcet or VDRA  Osvaldo Lamping K. 09/16/2021, 10:36 AM

## 2021-09-16 NOTE — Progress Notes (Signed)
Initial Nutrition Assessment  DOCUMENTATION CODES:   Not applicable  INTERVENTION:  -Nepro Shake po BID, each supplement provides 425 kcal and 19 grams protein -renal mvi daily  NUTRITION DIAGNOSIS:   Increased nutrient needs related to wound healing, chronic illness (ESRD on HD) as evidenced by estimated needs.  GOAL:   Patient will meet greater than or equal to 90% of their needs  MONITOR:   PO intake, Supplement acceptance, Skin, Weight trends, Labs, I & O's  REASON FOR ASSESSMENT:   Consult Wound healing  ASSESSMENT:   Pt with PMH significant for type 2 DM, anxiety, chronic R foot wound s/p TMA, and ESRD on HD admitted with R foot osteomyelitis  Per Vascular, limb is unsalvageable and pt is needing R BKA -- tentatively scheduled for tomorrow.   Pt unavailable at time of RD visit. Pt in HD per RN.   PO Intake: none documented   EDW 59.5 kg Pre-HD weight/admit weight 55 kg Pt will need new outpt EDW if current wt is accurate as it is lower than EDW  UOP: none documented x24 hours I/O: +463ml since admit  Medications: rocaltrol, sensipar, SSI Q4H, flagyl, renvela, IV abx Labs: Recent Labs  Lab 09/15/21 1547  NA 137  K 4.8  CL 92*  CO2 21*  BUN 26*  CREATININE 5.39*  CALCIUM 8.6*  GLUCOSE 130*  CBGs: 112-209 x24 hours    NUTRITION - FOCUSED PHYSICAL EXAM: Unable to perform at this time. Will attempt at follow-up.   Diet Order:   Diet Order             Diet NPO time specified Except for: Sips with Meds  Diet effective midnight           Diet renal/carb modified with fluid restriction Diet-HS Snack? Nothing; Fluid restriction: 1200 mL Fluid; Room service appropriate? Yes; Fluid consistency: Thin  Diet effective now                   EDUCATION NEEDS:   No education needs have been identified at this time  Skin:  Skin Assessment: Skin Integrity Issues: Skin Integrity Issues:: Incisions Incisions: R foot Incision - Dehisced  Last BM:   PTA  Height:   Ht Readings from Last 1 Encounters:  09/04/21 5\' 1"  (1.549 m)    Weight:   Wt Readings from Last 1 Encounters:  09/16/21 55 kg    BMI:  Body mass index is 22.91 kg/m.  Estimated Nutritional Needs:   Kcal:  1900-2100  Protein:  95-110 grams  Fluid:  1L+UOP     Theone Stanley., MS, RD, LDN (she/her/hers) RD pager number and weekend/on-call pager number located in [place].

## 2021-09-17 ENCOUNTER — Inpatient Hospital Stay (HOSPITAL_COMMUNITY): Payer: Medicare Other | Admitting: Certified Registered Nurse Anesthetist

## 2021-09-17 ENCOUNTER — Encounter (HOSPITAL_COMMUNITY): Payer: Self-pay | Admitting: Internal Medicine

## 2021-09-17 ENCOUNTER — Encounter (HOSPITAL_COMMUNITY)
Admission: EM | Disposition: A | Discharge status: Other Acute Inpatient Hospital | Payer: Self-pay | Source: Home / Self Care | Attending: Internal Medicine

## 2021-09-17 DIAGNOSIS — T8744 Infection of amputation stump, left lower extremity: Secondary | ICD-10-CM | POA: Diagnosis not present

## 2021-09-17 DIAGNOSIS — L089 Local infection of the skin and subcutaneous tissue, unspecified: Secondary | ICD-10-CM | POA: Diagnosis not present

## 2021-09-17 DIAGNOSIS — N186 End stage renal disease: Secondary | ICD-10-CM | POA: Diagnosis not present

## 2021-09-17 DIAGNOSIS — A419 Sepsis, unspecified organism: Secondary | ICD-10-CM

## 2021-09-17 DIAGNOSIS — E872 Acidosis, unspecified: Secondary | ICD-10-CM | POA: Diagnosis not present

## 2021-09-17 DIAGNOSIS — M869 Osteomyelitis, unspecified: Secondary | ICD-10-CM | POA: Diagnosis not present

## 2021-09-17 HISTORY — PX: AMPUTATION: SHX166

## 2021-09-17 LAB — CBC
HCT: 28.8 % — ABNORMAL LOW (ref 36.0–46.0)
Hemoglobin: 8.4 g/dL — ABNORMAL LOW (ref 12.0–15.0)
MCH: 26.8 pg (ref 26.0–34.0)
MCHC: 29.2 g/dL — ABNORMAL LOW (ref 30.0–36.0)
MCV: 91.7 fL (ref 80.0–100.0)
Platelets: 270 10*3/uL (ref 150–400)
RBC: 3.14 MIL/uL — ABNORMAL LOW (ref 3.87–5.11)
RDW: 21.3 % — ABNORMAL HIGH (ref 11.5–15.5)
WBC: 21 10*3/uL — ABNORMAL HIGH (ref 4.0–10.5)
nRBC: 2.1 % — ABNORMAL HIGH (ref 0.0–0.2)

## 2021-09-17 LAB — BASIC METABOLIC PANEL
Anion gap: 18 — ABNORMAL HIGH (ref 5–15)
BUN: 30 mg/dL — ABNORMAL HIGH (ref 6–20)
CO2: 22 mmol/L (ref 22–32)
Calcium: 8.6 mg/dL — ABNORMAL LOW (ref 8.9–10.3)
Chloride: 93 mmol/L — ABNORMAL LOW (ref 98–111)
Creatinine, Ser: 4.85 mg/dL — ABNORMAL HIGH (ref 0.44–1.00)
GFR, Estimated: 11 mL/min — ABNORMAL LOW (ref >60)
Glucose, Bld: 273 mg/dL — ABNORMAL HIGH (ref 70–99)
Potassium: 5.1 mmol/L (ref 3.5–5.1)
Sodium: 133 mmol/L — ABNORMAL LOW (ref 135–145)

## 2021-09-17 LAB — GLUCOSE, CAPILLARY
Glucose-Capillary: 220 mg/dL — ABNORMAL HIGH (ref 70–99)
Glucose-Capillary: 203 mg/dL — ABNORMAL HIGH (ref 70–99)
Glucose-Capillary: 203 mg/dL — ABNORMAL HIGH (ref 70–99)
Glucose-Capillary: 271 mg/dL — ABNORMAL HIGH (ref 70–99)
Glucose-Capillary: 405 mg/dL — ABNORMAL HIGH (ref 70–99)
Glucose-Capillary: 520 mg/dL (ref 70–99)
Glucose-Capillary: 475 mg/dL — ABNORMAL HIGH (ref 70–99)

## 2021-09-17 LAB — SURGICAL PCR SCREEN
MRSA, PCR: NEGATIVE
Staphylococcus aureus: NEGATIVE

## 2021-09-17 SURGERY — AMPUTATION BELOW KNEE
Anesthesia: General | Site: Knee | Laterality: Right

## 2021-09-17 MED ORDER — DEXAMETHASONE SODIUM PHOSPHATE 10 MG/ML IJ SOLN
INTRAMUSCULAR | Status: AC
  Filled 2021-09-17: qty 1

## 2021-09-17 MED ORDER — METRONIDAZOLE 500 MG/100ML IV SOLN
INTRAVENOUS | Status: DC | PRN
  Administered 2021-09-17: 500 mg via INTRAVENOUS

## 2021-09-17 MED ORDER — MIDAZOLAM HCL 2 MG/2ML IJ SOLN
INTRAMUSCULAR | Status: DC | PRN
Start: 2021-09-17 — End: 2021-09-17
  Administered 2021-09-17: 2 mg via INTRAVENOUS

## 2021-09-17 MED ORDER — ONDANSETRON HCL 4 MG/2ML IJ SOLN
INTRAMUSCULAR | Status: AC
  Filled 2021-09-17: qty 2

## 2021-09-17 MED ORDER — PHENYLEPHRINE 40 MCG/ML (10ML) SYRINGE FOR IV PUSH (FOR BLOOD PRESSURE SUPPORT)
PREFILLED_SYRINGE | INTRAVENOUS | Status: AC
  Filled 2021-09-17: qty 10

## 2021-09-17 MED ORDER — PROPOFOL 10 MG/ML IV BOLUS
INTRAVENOUS | Status: DC | PRN
  Administered 2021-09-17: 130 mg via INTRAVENOUS

## 2021-09-17 MED ORDER — VANCOMYCIN HCL 500 MG/100ML IV SOLN
500.0000 mg | INTRAVENOUS | Status: DC
  Filled 2021-09-17: qty 100

## 2021-09-17 MED ORDER — PHENOL 1.4 % MT LIQD
1.0000 | OROMUCOSAL | Status: DC | PRN
  Filled 2021-09-17: qty 177

## 2021-09-17 MED ORDER — LABETALOL HCL 5 MG/ML IV SOLN: 10.0000 mg | INTRAVENOUS | Status: DC | PRN

## 2021-09-17 MED ORDER — LIDOCAINE 2% (20 MG/ML) 5 ML SYRINGE
INTRAMUSCULAR | Status: AC
  Filled 2021-09-17: qty 5

## 2021-09-17 MED ORDER — INSULIN ASPART 100 UNIT/ML IJ SOLN
4.0000 [IU] | Freq: Once | INTRAMUSCULAR | Status: AC
  Administered 2021-09-17: 4 [IU] via SUBCUTANEOUS

## 2021-09-17 MED ORDER — GUAIFENESIN-DM 100-10 MG/5ML PO SYRP
15.0000 mL | ORAL_SOLUTION | ORAL | Status: DC | PRN
  Filled 2021-09-17: qty 15

## 2021-09-17 MED ORDER — FENTANYL CITRATE (PF) 250 MCG/5ML IJ SOLN
INTRAMUSCULAR | Status: AC
  Filled 2021-09-17: qty 5

## 2021-09-17 MED ORDER — ONDANSETRON HCL 4 MG/2ML IJ SOLN
4.0000 mg | Freq: Four times a day (QID) | INTRAMUSCULAR | Status: DC | PRN
  Administered 2021-09-24: 4 mg via INTRAVENOUS
  Filled 2021-09-17: qty 2

## 2021-09-17 MED ORDER — ONDANSETRON HCL 4 MG/2ML IJ SOLN
INTRAMUSCULAR | Status: DC | PRN
  Administered 2021-09-17: 4 mg via INTRAVENOUS

## 2021-09-17 MED ORDER — FENTANYL CITRATE (PF) 100 MCG/2ML IJ SOLN: 25.0000 ug | INTRAMUSCULAR | Status: DC | PRN

## 2021-09-17 MED ORDER — BUPIVACAINE LIPOSOME 1.3 % IJ SUSP
INTRAMUSCULAR | Status: DC | PRN
  Administered 2021-09-17: 20 mL

## 2021-09-17 MED ORDER — DIPHENHYDRAMINE HCL 50 MG/ML IJ SOLN
INTRAMUSCULAR | Status: AC
  Filled 2021-09-17: qty 1

## 2021-09-17 MED ORDER — PANTOPRAZOLE SODIUM 40 MG PO TBEC
40.0000 mg | DELAYED_RELEASE_TABLET | Freq: Every day | ORAL | Status: DC
  Administered 2021-09-17 – 2021-09-23 (×5): 40 mg via ORAL
  Filled 2021-09-17 (×5): qty 1

## 2021-09-17 MED ORDER — FENTANYL CITRATE (PF) 250 MCG/5ML IJ SOLN
INTRAMUSCULAR | Status: DC | PRN
  Administered 2021-09-17: 50 ug via INTRAVENOUS

## 2021-09-17 MED ORDER — MAGNESIUM SULFATE 2 GM/50ML IV SOLN
2.0000 g | Freq: Every day | INTRAVENOUS | Status: DC | PRN
  Filled 2021-09-17: qty 50

## 2021-09-17 MED ORDER — ALUM & MAG HYDROXIDE-SIMETH 200-200-20 MG/5ML PO SUSP
15.0000 mL | ORAL | Status: DC | PRN
  Filled 2021-09-17: qty 30

## 2021-09-17 MED ORDER — METRONIDAZOLE 500 MG/100ML IV SOLN
INTRAVENOUS | Status: AC
  Filled 2021-09-17: qty 100

## 2021-09-17 MED ORDER — PROPOFOL 10 MG/ML IV BOLUS
INTRAVENOUS | Status: AC
  Filled 2021-09-17: qty 20

## 2021-09-17 MED ORDER — SODIUM CHLORIDE 0.9 % IV SOLN: INTRAVENOUS | Status: DC

## 2021-09-17 MED ORDER — POTASSIUM CHLORIDE CRYS ER 20 MEQ PO TBCR: 20.0000 meq | EXTENDED_RELEASE_TABLET | Freq: Every day | ORAL | Status: DC | PRN

## 2021-09-17 MED ORDER — GABAPENTIN 100 MG PO CAPS
100.0000 mg | ORAL_CAPSULE | Freq: Three times a day (TID) | ORAL | Status: DC
  Administered 2021-09-17 – 2021-09-20 (×12): 100 mg via ORAL
  Filled 2021-09-17 (×12): qty 1

## 2021-09-17 MED ORDER — INSULIN GLARGINE-YFGN 100 UNIT/ML ~~LOC~~ SOLN
5.0000 [IU] | Freq: Every day | SUBCUTANEOUS | Status: DC
  Administered 2021-09-17 – 2021-09-18 (×2): 5 [IU] via SUBCUTANEOUS
  Filled 2021-09-17 (×2): qty 0.05

## 2021-09-17 MED ORDER — LIDOCAINE 2% (20 MG/ML) 5 ML SYRINGE
INTRAMUSCULAR | Status: DC | PRN
Start: 2021-09-17 — End: 2021-09-17
  Administered 2021-09-17: 60 mg via INTRAVENOUS

## 2021-09-17 MED ORDER — METOPROLOL TARTRATE 5 MG/5ML IV SOLN: 2.0000 mg | INTRAVENOUS | Status: DC | PRN

## 2021-09-17 MED ORDER — DOCUSATE SODIUM 100 MG PO CAPS
100.0000 mg | ORAL_CAPSULE | Freq: Every day | ORAL | Status: DC
  Administered 2021-09-19 – 2021-09-20 (×2): 100 mg via ORAL
  Filled 2021-09-17 (×5): qty 1

## 2021-09-17 MED ORDER — PROMETHAZINE HCL 25 MG/ML IJ SOLN: 6.2500 mg | INTRAMUSCULAR | Status: DC | PRN

## 2021-09-17 MED ORDER — EPHEDRINE SULFATE-NACL 50-0.9 MG/10ML-% IV SOSY
PREFILLED_SYRINGE | INTRAVENOUS | Status: DC | PRN
Start: 2021-09-17 — End: 2021-09-17
  Administered 2021-09-17 (×2): 5 mg via INTRAVENOUS

## 2021-09-17 MED ORDER — PHENYLEPHRINE HCL-NACL 20-0.9 MG/250ML-% IV SOLN
INTRAVENOUS | Status: DC | PRN
Start: 2021-09-17 — End: 2021-09-17
  Administered 2021-09-17: 25 ug/min via INTRAVENOUS

## 2021-09-17 MED ORDER — PHENYLEPHRINE 40 MCG/ML (10ML) SYRINGE FOR IV PUSH (FOR BLOOD PRESSURE SUPPORT)
PREFILLED_SYRINGE | INTRAVENOUS | Status: DC | PRN
  Administered 2021-09-17: 80 ug via INTRAVENOUS
  Administered 2021-09-17: 120 ug via INTRAVENOUS

## 2021-09-17 MED ORDER — EPHEDRINE 5 MG/ML INJ
INTRAVENOUS | Status: AC
  Filled 2021-09-17: qty 5

## 2021-09-17 MED ORDER — 0.9 % SODIUM CHLORIDE (POUR BTL) OPTIME
TOPICAL | Status: DC | PRN
  Administered 2021-09-17: 1000 mL

## 2021-09-17 MED ORDER — CHLORHEXIDINE GLUCONATE 0.12 % MT SOLN
15.0000 mL | Freq: Once | OROMUCOSAL | Status: AC
  Administered 2021-09-17: 15 mL via OROMUCOSAL
  Filled 2021-09-17: qty 15

## 2021-09-17 MED ORDER — NAPROXEN 250 MG PO TABS
250.0000 mg | ORAL_TABLET | Freq: Two times a day (BID) | ORAL | Status: DC
Start: 2021-09-17 — End: 2021-09-23
  Administered 2021-09-17 – 2021-09-23 (×8): 250 mg via ORAL
  Filled 2021-09-17 (×11): qty 1

## 2021-09-17 MED ORDER — MIDAZOLAM HCL 2 MG/2ML IJ SOLN
INTRAMUSCULAR | Status: AC
  Filled 2021-09-17: qty 2

## 2021-09-17 MED ORDER — DEXAMETHASONE SODIUM PHOSPHATE 10 MG/ML IJ SOLN
INTRAMUSCULAR | Status: DC | PRN
Start: 2021-09-17 — End: 2021-09-17
  Administered 2021-09-17: 5 mg via INTRAVENOUS

## 2021-09-17 MED ORDER — HYDRALAZINE HCL 20 MG/ML IJ SOLN: 5.0000 mg | INTRAMUSCULAR | Status: DC | PRN

## 2021-09-17 MED ORDER — TRAMADOL HCL 50 MG PO TABS
50.0000 mg | ORAL_TABLET | Freq: Four times a day (QID) | ORAL | Status: DC
  Administered 2021-09-17 – 2021-09-18 (×4): 50 mg via ORAL
  Filled 2021-09-17 (×4): qty 1

## 2021-09-17 MED ORDER — ACETAMINOPHEN 10 MG/ML IV SOLN
INTRAVENOUS | Status: DC | PRN
Start: 2021-09-17 — End: 2021-09-17
  Administered 2021-09-17: 1000 mg via INTRAVENOUS

## 2021-09-17 SURGICAL SUPPLY — 63 items
BAG COUNTER SPONGE SURGICOUNT (BAG) ×2 IMPLANT
BAG SPNG CNTER NS LX DISP (BAG) ×1
BANDAGE ESMARK 6X9 LF (GAUZE/BANDAGES/DRESSINGS) IMPLANT
BLADE SAW GIGLI 510 (BLADE) ×2 IMPLANT
BNDG CMPR 9X6 STRL LF SNTH (GAUZE/BANDAGES/DRESSINGS)
BNDG COHESIVE 6X5 TAN STRL LF (GAUZE/BANDAGES/DRESSINGS) ×2 IMPLANT
BNDG ELASTIC 4X5.8 VLCR STR LF (GAUZE/BANDAGES/DRESSINGS) ×2 IMPLANT
BNDG ELASTIC 6X5.8 VLCR STR LF (GAUZE/BANDAGES/DRESSINGS) ×2 IMPLANT
BNDG ESMARK 6X9 LF (GAUZE/BANDAGES/DRESSINGS)
BNDG GAUZE ELAST 4 BULKY (GAUZE/BANDAGES/DRESSINGS) IMPLANT
CANISTER SUCT 3000ML PPV (MISCELLANEOUS) ×2 IMPLANT
CLIP VESOCCLUDE MED 6/CT (CLIP) IMPLANT
COVER SURGICAL LIGHT HANDLE (MISCELLANEOUS) ×2 IMPLANT
CUFF TOURN SGL QUICK 34 (TOURNIQUET CUFF)
CUFF TOURN SGL QUICK 42 (TOURNIQUET CUFF) IMPLANT
CUFF TRNQT CYL 34X4.125X (TOURNIQUET CUFF) IMPLANT
DRAIN CHANNEL 19F RND (DRAIN) IMPLANT
DRAPE DERMATAC (DRAPES) IMPLANT
DRAPE HALF SHEET 40X57 (DRAPES) ×2 IMPLANT
DRAPE INCISE IOBAN 66X45 STRL (DRAPES) IMPLANT
DRAPE ORTHO SPLIT 77X108 STRL (DRAPES) ×2
DRAPE SURG ORHT 6 SPLT 77X108 (DRAPES) ×2 IMPLANT
DRESSING PREVENA PLUS CUSTOM (GAUZE/BANDAGES/DRESSINGS) IMPLANT
DRSG ADAPTIC 3X8 NADH LF (GAUZE/BANDAGES/DRESSINGS) ×2 IMPLANT
DRSG PREVENA PLUS CUSTOM (GAUZE/BANDAGES/DRESSINGS)
ELECT REM PT RETURN 9FT ADLT (ELECTROSURGICAL) ×2
ELECTRODE REM PT RTRN 9FT ADLT (ELECTROSURGICAL) ×1 IMPLANT
EVACUATOR SILICONE 100CC (DRAIN) IMPLANT
GAUZE 4X4 16PLY ~~LOC~~+RFID DBL (SPONGE) ×2 IMPLANT
GAUZE SPONGE 4X4 12PLY STRL (GAUZE/BANDAGES/DRESSINGS) ×2 IMPLANT
GLOVE SRG 8 PF TXTR STRL LF DI (GLOVE) ×1 IMPLANT
GLOVE SURG POLYISO LF SZ7.5 (GLOVE) ×2 IMPLANT
GLOVE SURG UNDER POLY LF SZ8 (GLOVE) ×2
GOWN STRL REUS W/ TWL LRG LVL3 (GOWN DISPOSABLE) ×2 IMPLANT
GOWN STRL REUS W/ TWL XL LVL3 (GOWN DISPOSABLE) ×1 IMPLANT
GOWN STRL REUS W/TWL LRG LVL3 (GOWN DISPOSABLE) ×4
GOWN STRL REUS W/TWL XL LVL3 (GOWN DISPOSABLE) ×2
KIT BASIN OR (CUSTOM PROCEDURE TRAY) ×2 IMPLANT
KIT TURNOVER KIT B (KITS) ×2 IMPLANT
NDL 18GX3-1/2 9CM (NEEDLE) IMPLANT
NEEDLE 18GX3-1/2 9CM (NEEDLE) ×2 IMPLANT
NS IRRIG 1000ML POUR BTL (IV SOLUTION) ×2 IMPLANT
PACK GENERAL/GYN (CUSTOM PROCEDURE TRAY) ×2 IMPLANT
PAD ARMBOARD 7.5X6 YLW CONV (MISCELLANEOUS) ×4 IMPLANT
PENCIL BUTTON HOLSTER BLD 10FT (ELECTRODE) ×1 IMPLANT
PREVENA RESTOR ARTHOFORM 46X30 (CANNISTER) IMPLANT
SPONGE T-LAP 18X18 ~~LOC~~+RFID (SPONGE) ×2 IMPLANT
STAPLER VISISTAT 35W (STAPLE) ×2 IMPLANT
STOCKINETTE IMPERVIOUS LG (DRAPES) ×2 IMPLANT
SUT BONE WAX W31G (SUTURE) IMPLANT
SUT ETHILON 3 0 PS 1 (SUTURE) IMPLANT
SUT SILK 0 TIES 10X30 (SUTURE) ×2 IMPLANT
SUT SILK 2 0 (SUTURE)
SUT SILK 2 0 SH CR/8 (SUTURE) ×2 IMPLANT
SUT SILK 2-0 18XBRD TIE 12 (SUTURE) IMPLANT
SUT SILK 3 0 (SUTURE)
SUT SILK 3-0 18XBRD TIE 12 (SUTURE) IMPLANT
SUT VIC AB 2-0 CT1 18 (SUTURE) ×4 IMPLANT
SYR CONTROL 10ML LL (SYRINGE) ×1 IMPLANT
TAPE UMBILICAL COTTON 1/8X30 (MISCELLANEOUS) IMPLANT
TOWEL GREEN STERILE (TOWEL DISPOSABLE) ×4 IMPLANT
UNDERPAD 30X36 HEAVY ABSORB (UNDERPADS AND DIAPERS) ×2 IMPLANT
WATER STERILE IRR 1000ML POUR (IV SOLUTION) ×2 IMPLANT

## 2021-09-17 NOTE — Progress Notes (Addendum)
PHARMACY NOTE:  ANTIMICROBIAL RENAL DOSAGE ADJUSTMENT  Current antimicrobial regimen includes a mismatch between antimicrobial dosage and estimated renal function.  As per policy approved by the Pharmacy & Therapeutics and Medical Executive Committees, the antimicrobial dosage will be adjusted accordingly.  Current antimicrobial dosage: Vancomycin IV 750 mg QTues/Thurs/Sat with HD  Indication: Diabetic foot infection concerning for osteomyelitis  Renal Function:  Estimated Creatinine Clearance: 11.8 mL/min (A) (by C-G formula based on SCr of 4.85 mg/dL (H)). [x]      On intermittent HD, scheduled: []      On CRRT    Antimicrobial dosage has been changed to:  Vancomycin IV 500 mg QTues/Thurs/Sat with HD  Additional comments:  -Per protocol, post-HD vancomycin in patients 60 kg or less should be 500 mg. The most recent weight was not available when vancomycin was first dosed. A new weight was measured yesterday, therefore, the post-HD vancomycin dose is being adjusted accordingly.   -Continue cefepime IV 1g Q24H and metronidazole PO Q12H. Follow post-procedure plans, HD schedule, clinical status, and length of therapy. De-escalate therapy as clinically indicated.    Thank you for allowing pharmacy to be a part of this patient's care.  Shauna Hugh, PharmD, [place]  PGY-2 Pharmacy Resident 09/17/2021 10:55 AM  Please check [email or web address] for unit-specific pharmacy phone numbers.

## 2021-09-17 NOTE — Progress Notes (Signed)
Patient and patients sister [name] request that [name] be called and notified with changes with patient.

## 2021-09-17 NOTE — Progress Notes (Addendum)
PROGRESS NOTE    [name]  MWN:[id number] DOB: 1981-03-06 DOA: 09/15/2021 PCP: [facility]    Brief Narrative:  [name] is a 41 y.o. female with medical history significant for DMT2, ESRD with HD on Tu,Th, Sat, neuropathy, HTN with poor compliance with medical treatment and HD was sent after completing dialysis session today due to staff concern for altered mental status.  She was not speaking to them.  Details are obtained by EMS note, chart review and ER note.  During my visit patient was awake and oriented.  Every time I asked her a question for she would say she can hear even while I was speaking to her almost in a very loud voice she still would tell me she cannot hear.  When the nurse walked in and said yes you can hear, at that point patient miraculously could hear me with an normal tone conversation. Apparently she also did this to the ER attending.  2/4 responding appropriately this am. MRI called me about pt with osteo. 2/5 sleepy since she is post OR.  Status post amputation of right BKA today  Consultants:  Nephrology, vascular surgery  Procedures:   Antimicrobials:  Vancomycin metronidazole, cefepime   Subjective: Sleepy but does say no to pain or shortness of breath  Objective: Vitals:   09/17/21 0930 09/17/21 0945 09/17/21 1001 09/17/21 1150  BP: (!) 92/58 (!) 98/57 118/67 111/66  Pulse: 74 76 80 84  Resp: 15 13 16 18   Temp:  97.9 F (36.6 C) 97.8 F (36.6 C) 98 F (36.7 C)  TempSrc:   Oral Oral  SpO2: 97% 97% 100% 100%  Weight:        Intake/Output Summary (Last 24 hours) at 09/17/2021 1329 Last data filed at 09/17/2021 0911 Gross per 24 hour  Intake 450 ml  Output 1030 ml  Net -580 ml   Filed Weights   09/16/21 1335 09/16/21 1648  Weight: 55 kg 54 kg    Examination: Calm, NAD, sleepy Cta no w/r Reg s1/s2 no gallop Soft benign +bs No edema, rt dressin Unable to assess neuro Mood and affect appropriate in  current setting     Data Reviewed: I have personally reviewed following labs and imaging studies  CBC: Recent Labs  Lab 09/15/21 1547 09/16/21 0941 09/17/21 0126  WBC 17.9* 19.9* 21.0*  NEUTROABS 16.2*  --   --   HGB 9.5* 8.7* 8.4*  HCT 31.0* 27.7* 28.8*  MCV 88.8 87.4 91.7  PLT 260 300 403   Basic Metabolic Panel: Recent Labs  Lab 09/15/21 1547 09/17/21 0126  NA 137 133*  K 4.8 5.1  CL 92* 93*  CO2 21* 22  GLUCOSE 130* 273*  BUN 26* 30*  CREATININE 5.39* 4.85*  CALCIUM 8.6* 8.6*   GFR: Estimated Creatinine Clearance: 11.8 mL/min (A) (by C-G formula based on SCr of 4.85 mg/dL (H)). Liver Function Tests: Recent Labs  Lab 09/15/21 1547  AST 38  ALT <5  ALKPHOS 141*  BILITOT 1.0  PROT 8.0  ALBUMIN 1.7*   No results for input(s): LIPASE, AMYLASE in the last 168 hours. No results for input(s): AMMONIA in the last 168 hours. Coagulation Profile: No results for input(s): INR, PROTIME in the last 168 hours. Cardiac Enzymes: No results for input(s): CKTOTAL, CKMB, CKMBINDEX, TROPONINI in the last 168 hours. BNP (last 3 results) No results for input(s): PROBNP in the last 8760 hours. HbA1C: No results for input(s): HGBA1C in the last 72  hours. CBG: Recent Labs  Lab 09/16/21 2353 09/17/21 0403 09/17/21 0750 09/17/21 0920 09/17/21 1126  GLUCAP 263* 220* 203* 203* 271*   Lipid Profile: No results for input(s): CHOL, HDL, LDLCALC, TRIG, CHOLHDL, LDLDIRECT in the last 72 hours. Thyroid Function Tests: No results for input(s): TSH, T4TOTAL, FREET4, T3FREE, THYROIDAB in the last 72 hours. Anemia Panel: No results for input(s): VITAMINB12, FOLATE, FERRITIN, TIBC, IRON, RETICCTPCT in the last 72 hours. Sepsis Labs: No results for input(s): PROCALCITON, LATICACIDVEN in the last 168 hours.  Recent Results (from the past 240 hour(s))  Resp Panel by RT-PCR (Flu A&B, Covid) Nasopharyngeal Swab     Status: None   Collection Time: 09/15/21  4:11 PM   Specimen:  Nasopharyngeal Swab; Nasopharyngeal(NP) swabs in vial transport medium  Result Value Ref Range Status   SARS Coronavirus 2 by RT PCR NEGATIVE NEGATIVE Final    Comment: (NOTE) SARS-CoV-2 target nucleic acids are NOT DETECTED.  The SARS-CoV-2 RNA is generally detectable in upper respiratory specimens during the acute phase of infection. The lowest concentration of SARS-CoV-2 viral copies this assay can detect is 138 copies/mL. A negative result does not preclude SARS-Cov-2 infection and should not be used as the sole basis for treatment or other patient management decisions. A negative result may occur with  improper specimen collection/handling, submission of specimen other than nasopharyngeal swab, presence of viral mutation(s) within the areas targeted by this assay, and inadequate number of viral copies(<138 copies/mL). A negative result must be combined with clinical observations, patient history, and epidemiological information. The expected result is Negative.  Fact Sheet for Patients:  EntrepreneurPulse.com.au  Fact Sheet for Healthcare Providers:  IncredibleEmployment.be  This test is no t yet approved or cleared by the [place] FDA and  has been authorized for detection and/or diagnosis of SARS-CoV-2 by FDA under an Emergency Use Authorization (EUA). This EUA will remain  in effect (meaning this test can be used) for the duration of the COVID-19 declaration under Section 564(b)(1) of the Act, 21 U.S.C.section 360bbb-3(b)(1), unless the authorization is terminated  or revoked sooner.       Influenza A by PCR NEGATIVE NEGATIVE Final   Influenza B by PCR NEGATIVE NEGATIVE Final    Comment: (NOTE) The Xpert Xpress SARS-CoV-2/FLU/RSV plus assay is intended as an aid in the diagnosis of influenza from Nasopharyngeal swab specimens and should not be used as a sole basis for treatment. Nasal washings and aspirates are unacceptable for  Xpert Xpress SARS-CoV-2/FLU/RSV testing.  Fact Sheet for Patients: EntrepreneurPulse.com.au  Fact Sheet for Healthcare Providers: IncredibleEmployment.be  This test is not yet approved or cleared by the [place] FDA and has been authorized for detection and/or diagnosis of SARS-CoV-2 by FDA under an Emergency Use Authorization (EUA). This EUA will remain in effect (meaning this test can be used) for the duration of the COVID-19 declaration under Section 564(b)(1) of the Act, 21 U.S.C. section 360bbb-3(b)(1), unless the authorization is terminated or revoked.  Performed at [facility], [place] [address], [place], [place] [address]   Culture, blood (routine x 2)     Status: None (Preliminary result)   Collection Time: 09/16/21  9:40 AM   Specimen: BLOOD  Result Value Ref Range Status   Specimen Description BLOOD RIGHT ANTECUBITAL  Final   Special Requests   Final    BOTTLES DRAWN AEROBIC AND ANAEROBIC Blood Culture adequate volume   Culture   Final    NO GROWTH 1 DAY Performed at [facility]  [facility], [place] [address], [place], [place] [address]    Report Status PENDING  Incomplete  Culture, blood (routine x 2)     Status: None (Preliminary result)   Collection Time: 09/16/21  9:42 AM   Specimen: BLOOD LEFT HAND  Result Value Ref Range Status   Specimen Description BLOOD LEFT HAND  Final   Special Requests   Final    BOTTLES DRAWN AEROBIC AND ANAEROBIC Blood Culture results may not be optimal due to an inadequate volume of blood received in culture bottles   Culture   Final    NO GROWTH 1 DAY Performed at [facility], [place] [address], [place], [place] [address]    Report Status PENDING  Incomplete  Surgical pcr screen     Status: None   Collection Time: 09/17/21  6:16 AM   Specimen: Nasal Mucosa; Nasal Swab  Result Value Ref Range Status   MRSA, PCR NEGATIVE NEGATIVE Final   Staphylococcus aureus NEGATIVE NEGATIVE Final     Comment: (NOTE) The Xpert SA Assay (FDA approved for NASAL specimens in patients 67 years of age and older), is one component of a comprehensive surveillance program. It is not intended to diagnose infection nor to guide or monitor treatment. Performed at [facility], [place] [address], [place], [place] [address]          Radiology Studies: MR FOOT RIGHT WO CONTRAST  Result Date: 09/16/2021 CLINICAL DATA:  Status post partial amputation of the right foot. Patient presents with persistent infection. EXAM: MRI OF THE RIGHT FOREFOOT WITHOUT CONTRAST TECHNIQUE: Multiplanar, multisequence MR imaging of the right forefoot was performed. No intravenous contrast was administered. COMPARISON:  09/15/2021 FINDINGS: Bones/Joint/Cartilage Prior transmetatarsal amputation. Severe bone marrow edema in the residual metatarsal bases as well as the medial cuneiform, middle cuneiform, lateral cuneiform and distal cuboid concerning for osteomyelitis. Small area of T2 hyperintense signal in the distal lateral plantar aspect of the calcaneus with a small amount of adjacent air concerning for early osteomyelitis. Low signal foci in the navicular concerning for air as can be seen with osteomyelitis. Mild osteoarthritis of the talonavicular joint.  Normal alignment. Ligaments Anterior and posterior talofibular ligaments are intact. Anterior and posterior tibiofibular ligaments are intact. Deltoid ligament is intact. Lisfranc ligament is intact. Muscles and Tendons Flexor, peroneal and extensor compartment tendons are intact. Achilles tendon is intact. T2 hyperintense signal throughout the plantar musculature which may be neurogenic versus secondary to mild myositis. Soft tissue Soft tissue wound at the stump of the right foot overlying the transmetatarsal amputation. Extensive soft tissue emphysema throughout the midfoot surrounding the metatarsal bases, cuneiforms, cuboid. No soft tissue mass. IMPRESSION: 1. Prior  transmetatarsal amputation. Severe bone marrow edema in the residual metatarsal bases as well as the medial cuneiform, middle cuneiform, lateral cuneiform and distal cuboid concerning for osteomyelitis. 2. Small area of T2 hyperintense signal in the distal lateral plantar aspect of the calcaneus with a small amount of intraosseous air concerning for early osteomyelitis. 3. Low signal foci in the navicular concerning for air as can be seen with osteomyelitis. 4. Air throughout the soft tissues of the midfoot concerning for a necrotizing infection. Electronically Signed   By: Kathreen Devoid M.D.   On: 09/16/2021 07:24   DG Foot Complete Right  Result Date: 09/15/2021 CLINICAL DATA:  Recent amputation and nonhealing wound with drainage. Evaluate for possible osteomyelitis. EXAM: RIGHT FOOT COMPLETE - 3+ VIEW COMPARISON:  Right foot radiographs dated August 26, 2021 FINDINGS: Recent amputation through the bases of the metatarsals. Compared to recent postsurgical radiograph there is interval development of indistinct margins about the amputation site of the base of the metatarsals and multiple small cortical erosions, which in the presence of nonhealing wound is highly suspicious for osteomyelitis. There is soft tissue edema and likely multiple small pockets of gas about the surgical wound. IMPRESSION: Interval development of cortical erosions about the amputation margins, which in the presence of nonhealing wound is highly suspicious for acute osteomyelitis. MRI examination could be considered for further evaluation if clinically warranted. Electronically Signed   By: Keane Police D.O.   On: 09/15/2021 15:29        Scheduled Meds:  calcitRIOL  0.25 mcg Oral BID   Chlorhexidine Gluconate Cloth  6 each Topical Q0600   cinacalcet  90 mg Oral Q breakfast   [START ON 09/18/2021] docusate sodium  100 mg Oral Daily   dorzolamide-timolol  1 drop Left Eye BID   feeding supplement (NEPRO CARB STEADY)  237 mL Oral BID  BM   gabapentin  100 mg Oral TID   heparin injection (subcutaneous)  5,000 Units Subcutaneous Q8H   insulin aspart  0-6 Units Subcutaneous Q4H   lidocaine-prilocaine  1 application Topical UD   losartan  50 mg Oral Daily   metroNIDAZOLE  500 mg Oral Q12H   multivitamin  1 tablet Oral Daily   naproxen  250 mg Oral BID WC   pantoprazole  40 mg Oral Daily   sevelamer carbonate  1.6 g Oral TID WC   traMADol  50 mg Oral Q6H   Continuous Infusions:  ceFEPime (MAXIPIME) IV Stopped (09/16/21 2109)   magnesium sulfate bolus IVPB     [START ON 09/19/2021] vancomycin      Assessment & Plan:   Principal Problem:   Osteomyelitis ([place])   Altered MS-POA Resolved by the time I saw her.  Etiology unclear. Certainly possibly foot infection may play role but now MS resolved. 2/5 mental status at baseline       2. Rt foot infection POA X-ray concerning for osteomyelitis, recommended MRI MRI ordered and pending Vascular surgery to see patient over the weekend with possible amputation planning 2/5 s/p amputation RT bk today by Dr. Trula Slade  WBC elevated more Blood cultures pending MRI with osteo please see full report ID consulted and pending we will follow-up   3. ESRD POA Nephrology following 2/5 underwent dialysis to make up missed treatment on Thursday and HD planned for today We will resume TTS schedule with next dialysis 2/7    4.Anemia of chronic disease POA Hemoglobin at baseline  Continue to monitor       5.Diabetes mellitus type 2 with hyperglycemia POA BG elevated Will add Lantus 5 units daily Continue R-ISS   6.Hypoalbuminemia POA Albumin 1.7  nutrition consult   7.AG metabolic acidosis POA Likely from infection and missed HD Improved      DVT prophylaxis: Heparin Code Status: Full Family Communication: family in room (but playing games on phone) Disposition Plan:  Status is: Inpatient Remains inpatient appropriate because: IV treatment.  Possible  surgery.                LOS: 2 days   Time spent: 35 min     Nolberto Hanlon, MD [facility] Pager [phone]  If 7PM-7AM, please contact night-coverage 09/17/2021, 1:29 PM

## 2021-09-17 NOTE — H&P (View-Only) (Signed)
Subjective  -   No acute events   Physical Exam:  Right TMA with purulent drainage       Assessment/Plan:    Discused with patient who nodded understanding and husband via telephone.  Patient needs a BKA.  Her leg is not viable.  If we do not proceed with amputation, she could deteriorate.  Everyone is in agreement to proceed  [name] 09/17/2021 7:32 AM --  Vitals:   09/16/21 2032 09/17/21 0407  BP: 120/66 116/64  Pulse: (!) 45 87  Resp: 17 18  Temp: 98.6 F (37 C) 98.4 F (36.9 C)  SpO2: (!) 79% 99%    Intake/Output Summary (Last 24 hours) at 09/17/2021 0732 Last data filed at 09/16/2021 1648 Gross per 24 hour  Intake 240 ml  Output 1000 ml  Net -760 ml     Laboratory CBC    Component Value Date/Time   WBC 19.9 (H) 09/16/2021 0941   HGB 8.7 (L) 09/16/2021 0941   HGB 10.6 (L) 05/18/2020 0927   HCT 27.7 (L) 09/16/2021 0941   HCT 35.1 05/18/2020 0927   PLT 300 09/16/2021 0941   PLT 186 05/18/2020 0927    BMET    Component Value Date/Time   NA 133 (L) 09/17/2021 0126   NA 133 (L) 05/18/2020 0927   K 5.1 09/17/2021 0126   CL 93 (L) 09/17/2021 0126   CO2 22 09/17/2021 0126   GLUCOSE 273 (H) 09/17/2021 0126   BUN 30 (H) 09/17/2021 0126   BUN 45 (H) 05/18/2020 0927   CREATININE 4.85 (H) 09/17/2021 0126   CALCIUM 8.6 (L) 09/17/2021 0126   GFRNONAA 11 (L) 09/17/2021 0126   GFRAA 6 (L) 05/18/2020 0927    COAG Lab Results  Component Value Date   INR 1.2 08/10/2021   INR 1.1 06/30/2020   No results found for: PTT  Antibiotics Anti-infectives (From admission, onward)    Start     Dose/Rate Route Frequency Ordered Stop   09/16/21 1200  [MAR Hold]  vancomycin (VANCOREADY) IVPB 750 mg/150 mL        (MAR Hold since Sun 09/17/2021 at 0712.Hold Reason: Transfer to a Procedural area)   750 mg 150 mL/hr over 60 Minutes Intravenous Every T-Th-Sa (Hemodialysis) 09/15/21 1748     09/15/21 2100  [MAR Hold]  ceFEPIme (MAXIPIME) 1 g in sodium chloride  0.9 % 100 mL IVPB        (MAR Hold since Sun 09/17/2021 at 0712.Hold Reason: Transfer to a Procedural area)   1 g 200 mL/hr over 30 Minutes Intravenous Every 24 hours 09/15/21 1957     09/15/21 1800  ceFEPIme (MAXIPIME) 1 g in sodium chloride 0.9 % 100 mL IVPB  Status:  Discontinued        1 g 200 mL/hr over 30 Minutes Intravenous Every 24 hours 09/15/21 1742 09/15/21 1957   09/15/21 1745  [MAR Hold]  metroNIDAZOLE (FLAGYL) tablet 500 mg        (MAR Hold since Sun 09/17/2021 at 0712.Hold Reason: Transfer to a Procedural area)   500 mg Oral Every 12 hours 09/15/21 1725 09/22/21 2159   09/15/21 1615  clindamycin (CLEOCIN) IVPB 600 mg        600 mg 100 mL/hr over 30 Minutes Intravenous  Once 09/15/21 1600 09/15/21 1638   09/15/21 1600  vancomycin (VANCOCIN) IVPB 1000 mg/200 mL premix        1,000 mg 200 mL/hr over 60 Minutes Intravenous  Once 09/15/21 1545  09/15/21 1755   09/15/21 1600  clindamycin (CLEOCIN) IVPB 600 mg  Status:  Discontinued        600 mg 100 mL/hr over 30 Minutes Intravenous  Once 09/15/21 1545 09/15/21 1600        V. Leia Alf, M.D., [facility] [place] Office: [phone] Pager:  [phone]

## 2021-09-17 NOTE — Op Note (Signed)
° ° °  Patient name: [name] MRN: [id number] DOB: Dec 02, 1980 Sex: female  09/17/2021 Pre-operative Diagnosis: Infected right transmetatarsal amputation Post-operative diagnosis:  Same Surgeon:  [name] Assistants:  Ivin Booty, PA Procedure:   Right below-knee amputation Anesthesia:  General Blood Loss:  minimal Specimens:  right leg  Findings: Healthy tissue and muscle at the level of amputation  Indications: This is a 41 year old female with end-stage renal disease who presented with a septic foot approximately 1 month ago.  She underwent a open transmetatarsal amputation which was subsequently closed.  She comes back in today with a septic foot.  I see no options for foot salvage.  I discussed this with the patient to acknowledge proceeding with below-knee amputation.  I also spoke to her husband via phone.  Consent was tainted.  Procedure:  The patient was identified in the holding area and taken to Felt 11  The patient was then placed supine on the table. general anesthesia was administered.  The patient was prepped and draped in the usual sterile fashion.  A time out was called and antibiotics were administered.  A PA was necessary to expedite procedure and assist with technical details.  A circumferential measurement of the cath was taken approximately 13 cm below the tibial tuberosity.  This was used to create a two thirds one third posterior flap.  A #10 blade was used to make a skin incision.  Cautery was used about subcutaneous tissue.  The muscle was divided with cautery.  I circumferentially exposed the tibia and fibula.  The periosteum was elevated with a periosteal elevator.  I then identified the neurovascular bundle and divided this between hemostats.  Next a Gigli saw was used to transect the tibia, beveling the anterior surface.  Bone cutters were then used to transect the fibula.  An amputation knife was used to complete the amputation.  Next, rongeurs were used to  trim back the fibula.  A rasp was used to smooth the bone surfaces.  I then ligated the artery and vein proximal to the cut edge of the tibia.  Similarly the nerve was ligated proximal to the tibia.  The wound was then irrigated.  Hemostasis was achieved.  The fascia was closed with interrupted 2-0 Vicryl and the skin was closed with staples, followed by sterile dressing.  There were no immediate complications.   Disposition: To PACU stable.   Theotis Burrow, M.D., [facility] [place] Office: [phone] Pager:  [phone]

## 2021-09-17 NOTE — Anesthesia Preprocedure Evaluation (Addendum)
Anesthesia Evaluation  Patient identified by MRN, date of birth, ID band Patient awake and Patient confused    Reviewed: Allergy & Precautions, NPO status , Patient's Chart, lab work & pertinent test results  Airway Mallampati: II  TM Distance: >3 FB Neck ROM: Full    Dental  (+) Dental Advisory Given   Pulmonary neg pulmonary ROS,    breath sounds clear to auscultation       Cardiovascular hypertension, + Peripheral Vascular Disease   Rhythm:Regular Rate:Normal     Neuro/Psych negative neurological ROS     GI/Hepatic negative GI ROS, Neg liver ROS,   Endo/Other  diabetes, Type 2  Renal/GU ESRF and DialysisRenal disease     Musculoskeletal   Abdominal   Peds  Hematology  (+) Blood dyscrasia, anemia ,   Anesthesia Other Findings Sepsis 2/2 right osteomyelitis  Reproductive/Obstetrics                            Anesthesia Physical Anesthesia Plan  ASA: 3 and emergent  Anesthesia Plan: General   Post-op Pain Management: Ofirmev IV (intra-op)   Induction:   PONV Risk Score and Plan: 3 and Dexamethasone, Ondansetron, Treatment may vary due to age or medical condition and Midazolam  Airway Management Planned: LMA  Additional Equipment: None  Intra-op Plan:   Post-operative Plan: Extubation in OR  Informed Consent: I have reviewed the patients History and Physical, chart, labs and discussed the procedure including the risks, benefits and alternatives for the proposed anesthesia with the patient or authorized representative who has indicated his/her understanding and acceptance.     Dental advisory given and Consent reviewed with POA  Plan Discussed with: CRNA  Anesthesia Plan Comments:        Anesthesia Quick Evaluation

## 2021-09-17 NOTE — Progress Notes (Signed)
Subjective  -   No acute events   Physical Exam:  Right TMA with purulent drainage       Assessment/Plan:    Discused with patient who nodded understanding and husband via telephone.  Patient needs a BKA.  Her leg is not viable.  If we do not proceed with amputation, she could deteriorate.  Everyone is in agreement to proceed  Wells Jaliya Siegmann 09/17/2021 7:32 AM --  Vitals:   09/16/21 2032 09/17/21 0407  BP: 120/66 116/64  Pulse: (!) 45 87  Resp: 17 18  Temp: 98.6 F (37 C) 98.4 F (36.9 C)  SpO2: (!) 79% 99%    Intake/Output Summary (Last 24 hours) at 09/17/2021 0732 Last data filed at 09/16/2021 1648 Gross per 24 hour  Intake 240 ml  Output 1000 ml  Net -760 ml     Laboratory CBC    Component Value Date/Time   WBC 19.9 (H) 09/16/2021 0941   HGB 8.7 (L) 09/16/2021 0941   HGB 10.6 (L) 05/18/2020 0927   HCT 27.7 (L) 09/16/2021 0941   HCT 35.1 05/18/2020 0927   PLT 300 09/16/2021 0941   PLT 186 05/18/2020 0927    BMET    Component Value Date/Time   NA 133 (L) 09/17/2021 0126   NA 133 (L) 05/18/2020 0927   K 5.1 09/17/2021 0126   CL 93 (L) 09/17/2021 0126   CO2 22 09/17/2021 0126   GLUCOSE 273 (H) 09/17/2021 0126   BUN 30 (H) 09/17/2021 0126   BUN 45 (H) 05/18/2020 0927   CREATININE 4.85 (H) 09/17/2021 0126   CALCIUM 8.6 (L) 09/17/2021 0126   GFRNONAA 11 (L) 09/17/2021 0126   GFRAA 6 (L) 05/18/2020 0927    COAG Lab Results  Component Value Date   INR 1.2 08/10/2021   INR 1.1 06/30/2020   No results found for: PTT  Antibiotics Anti-infectives (From admission, onward)    Start     Dose/Rate Route Frequency Ordered Stop   09/16/21 1200  [MAR Hold]  vancomycin (VANCOREADY) IVPB 750 mg/150 mL        (MAR Hold since Sun 09/17/2021 at 0712.Hold Reason: Transfer to a Procedural area)   750 mg 150 mL/hr over 60 Minutes Intravenous Every T-Th-Sa (Hemodialysis) 09/15/21 1748     09/15/21 2100  [MAR Hold]  ceFEPIme (MAXIPIME) 1 g in sodium chloride  0.9 % 100 mL IVPB        (MAR Hold since Sun 09/17/2021 at 0712.Hold Reason: Transfer to a Procedural area)   1 g 200 mL/hr over 30 Minutes Intravenous Every 24 hours 09/15/21 1957     09/15/21 1800  ceFEPIme (MAXIPIME) 1 g in sodium chloride 0.9 % 100 mL IVPB  Status:  Discontinued        1 g 200 mL/hr over 30 Minutes Intravenous Every 24 hours 09/15/21 1742 09/15/21 1957   09/15/21 1745  [MAR Hold]  metroNIDAZOLE (FLAGYL) tablet 500 mg        (MAR Hold since Sun 09/17/2021 at 0712.Hold Reason: Transfer to a Procedural area)   500 mg Oral Every 12 hours 09/15/21 1725 09/22/21 2159   09/15/21 1615  clindamycin (CLEOCIN) IVPB 600 mg        600 mg 100 mL/hr over 30 Minutes Intravenous  Once 09/15/21 1600 09/15/21 1638   09/15/21 1600  vancomycin (VANCOCIN) IVPB 1000 mg/200 mL premix        1,000 mg 200 mL/hr over 60 Minutes Intravenous  Once 09/15/21 1545  09/15/21 1755   09/15/21 1600  clindamycin (CLEOCIN) IVPB 600 mg  Status:  Discontinued        600 mg 100 mL/hr over 30 Minutes Intravenous  Once 09/15/21 1545 09/15/21 1600        V. Leia Alf, M.D., [facility] [place] Office: [phone] Pager:  [phone]

## 2021-09-17 NOTE — Progress Notes (Signed)
Patient ID: [name], female   DOB: 20-Oct-1980, 41 y.o.   MRN: [id number]  [facility] Progress Note   Assessment/ Plan:   1.  Right foot osteomyelitis: Status post previous transmetatarsal amputation and appears to have recurrence of osteomyelitis and residual tissue with likely arterial insufficiency-now status post right below-knee amputation earlier today by Dr. Trula Slade.  On Maxipime and vancomycin. 2.  End-stage renal disease: Underwent hemodialysis yesterday to make up for missed treatment on Thursday and will schedule for hemodialysis electively again today.  She underwent hemodialysis yesterday and will resume TTS schedule with next dialysis 2/7 3.  Hypertension: Blood pressures currently at goal, continue to monitor with hemodialysis/UF and ongoing pain management. 4.  Severe protein calorie malnutrition: Compounded by current inflammatory complex from osteomyelitis/nonhealing ulcer/wound of right TMA which I anticipate will improve with BKA.  Continue renal diet with nutritional supplementation. 5.  Anemia of chronic disease: Status post Mircera at dialysis, monitor hemoglobin/hematocrit trend for need to decide on PRBC transfusion/ESA dosing 6.  Chronic kidney disease-metabolic bone disease: Calcium level acceptable, will follow phosphorus level with subsequent labs.  Not on Cinacalcet or VDRA  Subjective:   Unable to voice any complaints at this time-somnolent after just having come back from BKA.  She will need assistance from case manager to help check on the welfare of her 61 year old son   Objective:   BP 111/66    Pulse 84    Temp 98 F (36.7 C) (Oral)    Resp 18    Wt 54 kg    SpO2 100%    BMI 22.49 kg/m   Physical Exam: Gen: Somnolent, resting in bed, nurse at bedside CVS: Pulse regular rhythm, normal rate, S1 and S2 normal Resp: Poor inspiratory effort with decreased breath sounds over bases, no rales Abd: Soft, flat, nontender, bowel sounds normal Ext:  Status post right below-knee amputation, dressing clean/intact.  Dressing over right upper arm aVF  Labs: BMET Recent Labs  Lab 09/15/21 1547 09/17/21 0126  NA 137 133*  K 4.8 5.1  CL 92* 93*  CO2 21* 22  GLUCOSE 130* 273*  BUN 26* 30*  CREATININE 5.39* 4.85*  CALCIUM 8.6* 8.6*   CBC Recent Labs  Lab 09/15/21 1547 09/16/21 0941 09/17/21 0126  WBC 17.9* 19.9* 21.0*  NEUTROABS 16.2*  --   --   HGB 9.5* 8.7* 8.4*  HCT 31.0* 27.7* 28.8*  MCV 88.8 87.4 91.7  PLT 260 300 270     Medications:     calcitRIOL  0.25 mcg Oral BID   Chlorhexidine Gluconate Cloth  6 each Topical Q0600   cinacalcet  90 mg Oral Q breakfast   [START ON 09/18/2021] docusate sodium  100 mg Oral Daily   dorzolamide-timolol  1 drop Left Eye BID   feeding supplement (NEPRO CARB STEADY)  237 mL Oral BID BM   gabapentin  100 mg Oral TID   heparin injection (subcutaneous)  5,000 Units Subcutaneous Q8H   insulin aspart  0-6 Units Subcutaneous Q4H   lidocaine-prilocaine  1 application Topical UD   losartan  50 mg Oral Daily   metroNIDAZOLE  500 mg Oral Q12H   multivitamin  1 tablet Oral Daily   naproxen  250 mg Oral BID WC   pantoprazole  40 mg Oral Daily   sevelamer carbonate  1.6 g Oral TID WC   traMADol  50 mg Oral Q6H   Elmarie Shiley, MD 09/17/2021, 11:54 AM

## 2021-09-17 NOTE — Progress Notes (Signed)
Orthopedic Tech Progress Note Patient Details:  [name] 1981/03/26 [id number]  Order for BK [place] with shrinker with education called into [facility] at 1033.   Patient ID: [name], female   DOB: 30-Apr-1981, 41 y.o.   MRN: [id number]  [name] 09/17/2021, 10:42 AM

## 2021-09-17 NOTE — Anesthesia Procedure Notes (Signed)
Procedure Name: LMA Insertion Date/Time: 09/17/2021 8:02 AM Performed by: Reece Agar, CRNA Pre-anesthesia Checklist: Patient identified, Emergency Drugs available, Suction available and Patient being monitored Patient Re-evaluated:Patient Re-evaluated prior to induction Oxygen Delivery Method: Circle System Utilized Preoxygenation: Pre-oxygenation with 100% oxygen Induction Type: IV induction Ventilation: Mask ventilation without difficulty LMA: LMA inserted LMA Size: 4.0 Number of attempts: 1 Airway Equipment and Method: Bite block Placement Confirmation: positive ETCO2 Tube secured with: Tape Dental Injury: Teeth and Oropharynx as per pre-operative assessment

## 2021-09-17 NOTE — Transfer of Care (Signed)
Immediate Anesthesia Transfer of Care Note  Patient: [name]  Procedure(s) Performed: AMPUTATION RIGHT BELOW KNEE (Right: Knee)  Patient Location: PACU  Anesthesia Type:General  Level of Consciousness: drowsy  Airway & Oxygen Therapy: Patient Spontanous Breathing and Patient connected to face mask oxygen  Post-op Assessment: Report given to RN and Post -op Vital signs reviewed and stable  Post vital signs: Reviewed and stable  Last Vitals:  Vitals Value Taken Time  BP 128/70 09/17/21 0916  Temp    Pulse 75 09/17/21 0917  Resp 12 09/17/21 0917  SpO2 100 09/17/21 0917  Vitals shown include unvalidated device data.  Last Pain:  Vitals:   09/17/21 0740  TempSrc: Oral  PainSc:       Patients Stated Pain Goal: 0 (33/83/29 1916)  Complications: No notable events documented.

## 2021-09-17 NOTE — Interval H&P Note (Signed)
History and Physical Interval Note:  09/17/2021 7:34 AM  [name]  has presented today for surgery, with the diagnosis of Osteomyelitis Right Foot.  The various methods of treatment have been discussed with the patient and family. After consideration of risks, benefits and other options for treatment, the patient has consented to  Procedure(s): AMPUTATION BELOW KNEE (Right) as a surgical intervention.  The patient's history has been reviewed, patient examined, no change in status, stable for surgery.  I have reviewed the patient's chart and labs.  Questions were answered to the patient's satisfaction.     [name]

## 2021-09-17 NOTE — Anesthesia Postprocedure Evaluation (Signed)
Anesthesia Post Note  Patient: [name]  Procedure(s) Performed: AMPUTATION RIGHT BELOW KNEE (Right: Knee)     Patient location during evaluation: PACU Anesthesia Type: General Level of consciousness: awake and alert Pain management: pain level controlled Vital Signs Assessment: post-procedure vital signs reviewed and stable Respiratory status: spontaneous breathing, nonlabored ventilation, respiratory function stable and patient connected to nasal cannula oxygen Cardiovascular status: blood pressure returned to baseline and stable Postop Assessment: no apparent nausea or vomiting Anesthetic complications: no   No notable events documented.  Last Vitals:  Vitals:   09/17/21 1001 09/17/21 1150  BP: 118/67 111/66  Pulse: 80 84  Resp: 16 18  Temp: 36.6 C 36.7 C  SpO2: 100% 100%    Last Pain:  Vitals:   09/17/21 1150  TempSrc: Oral  PainSc:                  Tiajuana Amass

## 2021-09-18 ENCOUNTER — Encounter (HOSPITAL_COMMUNITY): Payer: Self-pay | Admitting: Surgery

## 2021-09-18 DIAGNOSIS — M869 Osteomyelitis, unspecified: Secondary | ICD-10-CM | POA: Diagnosis not present

## 2021-09-18 DIAGNOSIS — Z89421 Acquired absence of other right toe(s): Secondary | ICD-10-CM

## 2021-09-18 DIAGNOSIS — N186 End stage renal disease: Secondary | ICD-10-CM | POA: Diagnosis not present

## 2021-09-18 DIAGNOSIS — Z794 Long term (current) use of insulin: Secondary | ICD-10-CM

## 2021-09-18 DIAGNOSIS — E1165 Type 2 diabetes mellitus with hyperglycemia: Secondary | ICD-10-CM | POA: Diagnosis not present

## 2021-09-18 DIAGNOSIS — Z89511 Acquired absence of right leg below knee: Secondary | ICD-10-CM

## 2021-09-18 DIAGNOSIS — D638 Anemia in other chronic diseases classified elsewhere: Secondary | ICD-10-CM | POA: Diagnosis not present

## 2021-09-18 DIAGNOSIS — Z48812 Encounter for surgical aftercare following surgery on the circulatory system: Secondary | ICD-10-CM

## 2021-09-18 LAB — BASIC METABOLIC PANEL
Anion gap: 14 (ref 5–15)
BUN: 54 mg/dL — ABNORMAL HIGH (ref 6–20)
CO2: 22 mmol/L (ref 22–32)
Calcium: 7.4 mg/dL — ABNORMAL LOW (ref 8.9–10.3)
Chloride: 91 mmol/L — ABNORMAL LOW (ref 98–111)
Creatinine, Ser: 6.15 mg/dL — ABNORMAL HIGH (ref 0.44–1.00)
GFR, Estimated: 8 mL/min — ABNORMAL LOW (ref >60)
Glucose, Bld: 451 mg/dL — ABNORMAL HIGH (ref 70–99)
Potassium: 6.8 mmol/L (ref 3.5–5.1)
Sodium: 127 mmol/L — ABNORMAL LOW (ref 135–145)

## 2021-09-18 LAB — GLUCOSE, CAPILLARY
Glucose-Capillary: 281 mg/dL — ABNORMAL HIGH (ref 70–99)
Glucose-Capillary: 289 mg/dL — ABNORMAL HIGH (ref 70–99)
Glucose-Capillary: 292 mg/dL — ABNORMAL HIGH (ref 70–99)
Glucose-Capillary: 330 mg/dL — ABNORMAL HIGH (ref 70–99)
Glucose-Capillary: 413 mg/dL — ABNORMAL HIGH (ref 70–99)
Glucose-Capillary: 424 mg/dL — ABNORMAL HIGH (ref 70–99)
Glucose-Capillary: 537 mg/dL (ref 70–99)

## 2021-09-18 LAB — CBC
HCT: 28.7 % — ABNORMAL LOW (ref 36.0–46.0)
Hemoglobin: 8.5 g/dL — ABNORMAL LOW (ref 12.0–15.0)
MCH: 26.8 pg (ref 26.0–34.0)
MCHC: 29.6 g/dL — ABNORMAL LOW (ref 30.0–36.0)
MCV: 90.5 fL (ref 80.0–100.0)
Platelets: 322 10*3/uL (ref 150–400)
RBC: 3.17 MIL/uL — ABNORMAL LOW (ref 3.87–5.11)
RDW: 22.2 % — ABNORMAL HIGH (ref 11.5–15.5)
WBC: 15 10*3/uL — ABNORMAL HIGH (ref 4.0–10.5)
nRBC: 1.5 % — ABNORMAL HIGH (ref 0.0–0.2)

## 2021-09-18 LAB — PHOSPHORUS: Phosphorus: 7.4 mg/dL — ABNORMAL HIGH (ref 2.5–4.6)

## 2021-09-18 LAB — PATHOLOGIST SMEAR REVIEW

## 2021-09-18 LAB — POTASSIUM
Potassium: 4.9 mmol/L (ref 3.5–5.1)
Potassium: 4.2 mmol/L (ref 3.5–5.1)

## 2021-09-18 MED ORDER — INSULIN GLARGINE-YFGN 100 UNIT/ML ~~LOC~~ SOLN
5.0000 [IU] | Freq: Once | SUBCUTANEOUS | Status: AC
  Administered 2021-09-18: 5 [IU] via SUBCUTANEOUS
  Filled 2021-09-18: qty 0.05

## 2021-09-18 MED ORDER — INSULIN GLARGINE-YFGN 100 UNIT/ML ~~LOC~~ SOLN
10.0000 [IU] | Freq: Every day | SUBCUTANEOUS | Status: DC
  Administered 2021-09-19 – 2021-09-21 (×3): 10 [IU] via SUBCUTANEOUS
  Filled 2021-09-18 (×4): qty 0.1

## 2021-09-18 MED ORDER — INSULIN ASPART 100 UNIT/ML IJ SOLN
4.0000 [IU] | Freq: Once | INTRAMUSCULAR | Status: AC
  Administered 2021-09-18: 4 [IU] via SUBCUTANEOUS

## 2021-09-18 MED ORDER — TRAMADOL HCL 50 MG PO TABS
100.0000 mg | ORAL_TABLET | Freq: Two times a day (BID) | ORAL | Status: DC
  Administered 2021-09-18 – 2021-09-20 (×5): 100 mg via ORAL
  Filled 2021-09-18 (×6): qty 2

## 2021-09-18 MED ORDER — SODIUM ZIRCONIUM CYCLOSILICATE 10 G PO PACK
10.0000 g | PACK | Freq: Once | ORAL | Status: AC
  Administered 2021-09-18: 10 g via ORAL
  Filled 2021-09-18: qty 1

## 2021-09-18 MED ORDER — CHLORHEXIDINE GLUCONATE CLOTH 2 % EX PADS
6.0000 | MEDICATED_PAD | Freq: Every day | CUTANEOUS | Status: DC
  Administered 2021-09-19 – 2021-09-20 (×2): 6 via TOPICAL

## 2021-09-18 NOTE — Progress Notes (Signed)
°   09/18/21 1625  Clinical Encounter Type  Visited With Patient  Visit Type Initial  Referral From Nurse  Consult/Referral To Chaplain   Patient was asleep when I arrived and did not respond to her name being called. Follow-up on request for AD is required.

## 2021-09-18 NOTE — Evaluation (Signed)
Occupational Therapy Evaluation Patient Details Name: [name] MRN: [id number] DOB: 10-Nov-1980 Today's Date: 09/18/2021   History of Present Illness [name] is a 41 y.o. female who was sent after completing dialysis session 2/3 due to staff concern for altered mental status.  She was not speaking to them. 2/5 R BKA. PHMX: DMT2, ESRD with HD on T/T/Sat;neuropathy, HTN, R eye blindness,right transmetarsal amputation 08/16/21 with poor compliance with medical treatment and HD.   Clinical Impression   This 41 yo female admitted and underwent above presents to acute OT with PLOF unknown since she was here not too long ago and pt currently unable to tell us due to her decreased cognition. She currently is total A (except washing her face) for all basic ADLs and total A +2 for all mobility (except rolling in bed). She will benefit from acute OT with follow up currently recommended for SNF (will change to AIR dependent on cognitive clearance).      Recommendations for follow up therapy are one component of a multi-disciplinary discharge planning process, led by the attending physician.  Recommendations may be updated based on patient status, additional functional criteria and insurance authorization.   Follow Up Recommendations  Skilled nursing-short term rehab (<3 hours/day) (dependent on cognition clearance AIR may be appropriate))    Assistance Recommended at Discharge Frequent or constant Supervision/Assistance  Patient can return home with the following Two people to help with bathing/dressing/bathroom;Two people to help with walking and/or transfers;Assistance with cooking/housework;Assistance with feeding;Help with stairs or ramp for entrance;Assist for transportation;Direct supervision/assist for financial management;Direct supervision/assist for medications management    Functional Status Assessment  Patient has had a recent decline in their functional status and demonstrates the  ability to make significant improvements in function in a reasonable and predictable amount of time.  Equipment Recommendations  Other (comment) (TBD next venue)       Precautions / Restrictions Precautions Precautions: Fall Restrictions Weight Bearing Restrictions: Yes RLE Weight Bearing: Non weight bearing      Mobility Bed Mobility Overal bed mobility: Needs Assistance Bed Mobility: Rolling, Supine to Sit, Sit to Supine Rolling: Min assist (to left)   Supine to sit: Total assist, +2 for physical assistance Sit to supine: Total assist, +2 for physical assistance        Transfers                   General transfer comment: Atempted sit>stand with sara stedy but pt would not attempt to help [place] so did get pt OOB due to decreased safety to do so at this point due to decreased cognition      Balance Overall balance assessment: Needs assistance Sitting-balance support: Bilateral upper extremity supported (LLE on floor) Sitting balance-Leahy Scale: Zero Sitting balance - Comments: min -total A                                   ADL either performed or assessed with clinical judgement   ADL Overall ADL's : Needs assistance/impaired Eating/Feeding: Total assistance;Bed level   Grooming: Wash/dry face;Bed level Grooming Details (indicate cue type and reason): setup/S however not very thorough Upper Body Bathing: Maximal assistance;Bed level   Lower Body Bathing: Total assistance;Bed level   Upper Body Dressing : Total assistance;Bed level   Lower Body Dressing: Total assistance;Bed level                 General  ADL Comments: increased level of A due to lethargy     Vision Ability to See in Adequate Light: 2 Moderately impaired Additional Comments: Blind in right eye, kept eyes closed 99% of session            Pertinent Vitals/Pain Pain Assessment Pain Assessment: Faces Faces Pain Scale: No hurt     Hand Dominance Left    Extremity/Trunk Assessment Upper Extremity Assessment Upper Extremity Assessment: Overall WFL for tasks assessed RUE Deficits / Details: edema in arm with fistula but moves okay and no c/o pain           Communication Communication Communication: HOH (pt reporting she cannot hear (and that this is new). in chart there seems to be times when she can hear at normal tone and times she can't)   Cognition Arousal/Alertness: Lethargic Behavior During Therapy: Flat affect Overall Cognitive Status: Impaired/Different from baseline Area of Impairment: Orientation, Attention, Following commands, Safety/judgement, Awareness, Problem solving                 Orientation Level: Disoriented to, Time (January, could not tell us year--did know she was in hospital [facility]) with increased asking) Current Attention Level: Focused   Following Commands: Follows one step commands inconsistently, Follows one step commands with increased time Safety/Judgement: Decreased awareness of safety, Decreased awareness of deficits Awareness: Intellectual Problem Solving: Slow processing, Decreased initiation, Difficulty sequencing, Requires verbal cues, Requires tactile cues General Comments: Followed ~25% of one step commands, says she lives with her husband (but RN states differently)                Home Living Family/patient expects to be discharged to:: [place]: Spouse/significant other Available Help at Discharge: Family Type of Home: House Home Access: Level entry     Home Layout: Two level;Bed/bath upstairs Alternate Level Stairs-Number of Steps: 1 flight Alternate Level Stairs-Rails: Right Bathroom Shower/Tub: Occupational psychologist: Standard     Home Equipment: None   Additional Comments: All information taken from chart from one month ago, due to currently pt is inconsistent with answers or even answering questions. In speaking to RN she  reports pt is currenlty separated from husband and sister [name] should be primary contact person with all medical information and sister is trying to get guardianship of pt's son 40 yo).      Prior Functioning/Environment Prior Level of Function : Independent/Modified Independent               ADLs Comments: Independent with ADLs/selfcare        OT Problem List: Decreased strength;Decreased activity tolerance;Impaired balance (sitting and/or standing);Impaired vision/perception;Decreased coordination;Decreased cognition;Decreased safety awareness;Decreased knowledge of precautions;Decreased knowledge of use of DME or AE      OT Treatment/Interventions: Self-care/ADL training;DME and/or AE instruction;Patient/family education;Balance training    OT Goals(Current goals can be found in the care plan section) Acute Rehab OT Goals OT Goal Formulation: Patient unable to participate in goal setting Time For Goal Achievement: 10/02/21 Potential to Achieve Goals: Good  OT Frequency: Min 2X/week    Co-evaluation PT/OT/SLP Co-Evaluation/Treatment: Yes Reason for Co-Treatment: For patient/therapist safety;Necessary to address cognition/behavior during functional activity PT goals addressed during session: Mobility/safety with mobility;Balance;Proper use of DME;Strengthening/ROM OT goals addressed during session: ADL's and self-care;Strengthening/ROM      AM-PAC OT "6 Clicks" Daily Activity     Outcome Measure Help from another person eating meals?: Total Help from another person taking care of personal grooming?:  Total Help from another person toileting, which includes using toliet, bedpan, or urinal?: Total Help from another person bathing (including washing, rinsing, drying)?: Total Help from another person to put on and taking off regular upper body clothing?: Total Help from another person to put on and taking off regular lower body clothing?: Total 6 Click Score: 6   End of  Session Equipment Utilized During Treatment:  (sara stedy) Nurse Communication: Mobility status  Activity Tolerance: Patient limited by lethargy Patient left: in bed;with call bell/phone within reach;with bed alarm set  OT Visit Diagnosis: Other abnormalities of gait and mobility (R26.89);Unsteadiness on feet (R26.81);Muscle weakness (generalized) (M62.81);Other symptoms and signs involving cognitive function;Low vision, both eyes (H54.2)                Time: 2767-0110 OT Time Calculation (min): 23 min Charges:  OT General Charges $OT Visit: 1 Visit OT Evaluation $OT Eval Moderate Complexity: [place], OTR/L Acute [employer] Pager [phone] Office [phone]    Almon Register 09/18/2021, 10:18 AM

## 2021-09-18 NOTE — Progress Notes (Signed)
This chaplain responded to the consult for creating/updating the Pt. Advance Directive and naming the Pt. sister-[name] as HCPOA.  The chaplain reviewed the Pt. chart and was updated by the Pt. RN-Rachel before the visit. The chaplain understands the Pt. cognitive status at this time may be different from her baseline.  The chaplain left an AD with the RN for the Pt. to review. The chaplain will follow up with AD education at another time.  Chaplain Sallyanne Kuster [phone]
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
## 2021-09-18 NOTE — Progress Notes (Signed)
[facility] Progress Note   Subjective:   Patient seen and examined at bedside in room.  Somnolent and slow to respond, answers questions but does not open eyes.  Reports PT/OT went ok this AM, "as well as physical therapy can go." Denies CP, SOB, abdominal pain, and n/v/d.    Objective Vitals:   09/17/21 1604 09/17/21 2051 09/18/21 0349 09/18/21 0726  BP: 109/69 110/66 105/68 106/63  Pulse: 85 85 83 83  Resp: 16  18 16   Temp: 98.1 F (36.7 C) 98.2 F (36.8 C) 98 F (36.7 C) 98.1 F (36.7 C)  TempSrc: Oral Oral Oral Oral  SpO2: 100% 97% 100% 100%  Weight: 54 kg     Height: 5\' 1"  (1.549 m)      Physical Exam General:somnolent, chronically ill appearing female in NAD Heart:RRR, no mrg Lungs:CTAB, nml WOB on RA Abdomen:soft, NTND Extremities:no LE edema, R BKA in cannister  Dialysis Access: RU AVF +b/t   Filed Weights   09/16/21 1335 09/16/21 1648 09/17/21 1604  Weight: 55 kg 54 kg 54 kg    Intake/Output Summary (Last 24 hours) at 09/18/2021 1312 Last data filed at 09/17/2021 1631 Gross per 24 hour  Intake 477 ml  Output --  Net 477 ml    Additional Objective Labs: Basic Metabolic Panel: Recent Labs  Lab 09/15/21 1547 09/17/21 0126 09/18/21 0136 09/18/21 0711 09/18/21 1104  NA 137 133* 127*  --   --   K 4.8 5.1 6.8* 4.9 4.2  CL 92* 93* 91*  --   --   CO2 21* 22 22  --   --   GLUCOSE 130* 273* 451*  --   --   BUN 26* 30* 54*  --   --   CREATININE 5.39* 4.85* 6.15*  --   --   CALCIUM 8.6* 8.6* 7.4*  --   --   PHOS  --   --  7.4*  --   --    Liver Function Tests: Recent Labs  Lab 09/15/21 1547  AST 38  ALT <5  ALKPHOS 141*  BILITOT 1.0  PROT 8.0  ALBUMIN 1.7*    CBC: Recent Labs  Lab 09/15/21 1547 09/16/21 0941 09/17/21 0126 09/18/21 0136  WBC 17.9* 19.9* 21.0* 15.0*  NEUTROABS 16.2*  --   --   --   HGB 9.5* 8.7* 8.4* 8.5*  HCT 31.0* 27.7* 28.8* 28.7*  MCV 88.8 87.4 91.7 90.5  PLT 260 300 270 322   Blood Culture     Component Value Date/Time   SDES BLOOD LEFT HAND 09/16/2021 0942   SPECREQUEST  09/16/2021 0942    BOTTLES DRAWN AEROBIC AND ANAEROBIC Blood Culture results may not be optimal due to an inadequate volume of blood received in culture bottles   CULT  09/16/2021 0942    NO GROWTH 2 DAYS Performed at [facility], [place] [address] [place], [place] [address]    REPTSTATUS PENDING 09/16/2021 [id number]    Recent Labs  Lab 09/17/21 2247 09/18/21 0020 09/18/21 0333 09/18/21 0756 09/18/21 1130  GLUCAP 475* 413* 424* 281* 330*   Medications:  ceFEPime (MAXIPIME) IV Stopped (09/17/21 2109)   magnesium sulfate bolus IVPB     [START ON 09/19/2021] vancomycin      calcitRIOL  0.25 mcg Oral BID   Chlorhexidine Gluconate Cloth  6 each Topical Q0600   cinacalcet  90 mg Oral Q breakfast   docusate sodium  100 mg Oral Daily   dorzolamide-timolol  1 drop Left Eye BID   feeding supplement (NEPRO CARB STEADY)  237 mL Oral BID BM   gabapentin  100 mg Oral TID   heparin injection (subcutaneous)  5,000 Units Subcutaneous Q8H   insulin aspart  0-6 Units Subcutaneous Q4H   insulin glargine-yfgn  5 Units Subcutaneous Daily   lidocaine-prilocaine  1 application Topical UD   losartan  50 mg Oral Daily   metroNIDAZOLE  500 mg Oral Q12H   multivitamin  1 tablet Oral Daily   naproxen  250 mg Oral BID WC   pantoprazole  40 mg Oral Daily   sevelamer carbonate  1.6 g Oral TID WC   traMADol  100 mg Oral Q12H    Dialysis Orders: [facility] TTS, 3 hours 45 minutes, BFR 400/DFR 600, EDW 59.5 kg, 2K/2Ca UFP2   RU AV fistula 4000 unit bolus heparin Mircera 200 mcg IV every 2 weeks - last 1/26 Ancef 2 g 3 times weekly HD  Assessment/Plan: 1. 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 Trula Slade.  On maxipime and vancomycin.  2. ESRD - on HD TTS.  Falsely elevated K this AM d/t hemolysis of specimen.  Repeat x2 at normal level.  No acute indication for HD today.  Plan for HD tomorrow  per regular schedule.  3. Anemia of CKD- Hgb 8.5 pre surgery.  ESA due Thursday.  Transfuse pRBC prn  4. Secondary hyperparathyroidism - Calcium low and phos elevated.  Not on VDRA, ?why.  On renvela and sensipar 90mg  qd.  Hold sensipar for now with low Ca.  Recheck labs prior to HD.  5. HTN/volume - Blood pressure well controlled.  Does not appear volume overloaded.  UF as tolerated.  Will need lower dry weight on d/c post amputation.  6. Severe protein calorie malnutrition - Alb 1.7, give protein supplements.  Renal diet w/fluid restrictions.  7. AMS/somnolence - etiology unclear 8. DMT2 - per PMD  Jen Mow, PA-C [facility] 09/18/2021,1:12 PM  LOS: 3 days

## 2021-09-18 NOTE — Care Management Important Message (Signed)
Important Message  Patient Details  Name: [name] MRN: [id number] Date of Birth: 1981-06-06   Medicare Important Message Given:  Yes     Hannah Beat 09/18/2021, 11:54 AM

## 2021-09-18 NOTE — Plan of Care (Signed)
°  Problem: Education: Goal: Knowledge of General Education information will improve Description: Including pain rating scale, medication(s)/side effects and non-pharmacologic comfort measures Outcome: Progressing   Problem: Health Behavior/Discharge Planning: Goal: Ability to manage health-related needs will improve Outcome: Progressing   Problem: Clinical Measurements: Goal: Ability to maintain clinical measurements within normal limits will improve Outcome: Progressing Goal: Diagnostic test results will improve Outcome: Progressing   Problem: Elimination: Goal: Will not experience complications related to bowel motility Outcome: Progressing   Problem: Pain Managment: Goal: General experience of comfort will improve Outcome: Progressing   Problem: Safety: Goal: Ability to remain free from injury will improve Outcome: Progressing   Problem: Skin Integrity: Goal: Risk for impaired skin integrity will decrease Outcome: Progressing

## 2021-09-18 NOTE — Progress Notes (Signed)
Date and time results received: 09/18/21 0304   Test: K+ Critical Value: 6.8  Name of Provider Notified: [place]  Orders Received? Or Actions Taken?: awaiting response
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
## 2021-09-18 NOTE — Progress Notes (Signed)
Inpatient Diabetes Program Recommendations  AACE/ADA: New Consensus Statement on Inpatient Glycemic Control (2015)  Target Ranges:  Prepandial:   less than 140 mg/dL      Peak postprandial:   less than 180 mg/dL (1-2 hours)      Critically ill patients:  140 - 180 mg/dL   Lab Results  Component Value Date   GLUCAP 281 (H) 09/18/2021   HGBA1C 10.4 (H) 08/11/2021    Latest Reference Range & Units 09/17/21 07:50 09/17/21 09:20 09/17/21 11:26 09/17/21 16:40 09/17/21 20:11 09/17/21 20:47 09/17/21 22:47 09/18/21 00:20 09/18/21 03:33 09/18/21 07:56  Glucose-Capillary 70 - 99 mg/dL 203 (H) 203 (H) 271 (H) 405 (H) 520 (HH) 537 (HH) 475 (H) 413 (H) 424 (H) 281 (H)   Review of Glycemic Control  Diabetes history: DM 2 Outpatient Diabetes medications: Lantus 5 units, Novolog 5 units tid meal coverage Current orders for Inpatient glycemic control:  Semglee 5 units Novolog 0-6 units Q4  A1c 10.4% on 08/11/2021 Diabetes Coordinator spoke with pt on 08/14/2021 and educated on CBG monitoring and lifestyle changes.  Note: Decadron 5 mg given yesterday aiding in hyperglycemia   Thanks,  Tama Headings RN, MSN, BC-ADM Inpatient Diabetes Coordinator Team Pager [phone] (8a-5p)

## 2021-09-18 NOTE — Evaluation (Signed)
Physical Therapy Evaluation  Patient Details Name: [name] MRN: [id number] DOB: 1981/06/02 Today's Date: 09/18/2021  History of Present Illness  [name] is a 41 y.o. female who was sent after completing dialysis session 09/15/21 due to staff concern for altered mental status.  She was not speaking to them. 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   Clinical Impression    Pt admitted with above diagnosis. Pt currently with functional limitations due to the deficits listed below (see PT Problem List). At the time of PT eval pt was lethargic and with decreased cognition. She currently requires heavy +2 assist for all aspects of mobility, and is unable to stand EOB with +2 assist and Stedy. Based on performance today, pt is most appropriate for SNF level rehab, however if cognition clears, may become AIR appropriate and will continue to monitor for this change. Acutely, pt will benefit from skilled PT to increase their independence and safety with mobility to allow discharge to the venue listed below.        Recommendations for follow up therapy are one component of a multi-disciplinary discharge planning process, led by the attending physician.  Recommendations may be updated based on patient status, additional functional criteria and insurance authorization.  Follow Up Recommendations Skilled nursing-short term rehab (<3 hours/day)    Assistance Recommended at Discharge Frequent or constant Supervision/Assistance  Patient can return home with the following  Two people to help with walking and/or transfers;Two people to help with bathing/dressing/bathroom    Equipment Recommendations Other (comment) (TBD by next venue of care)  Recommendations for Other Services        Functional Status Assessment Patient has had a recent decline in their functional status and demonstrates the ability to make significant improvements in function in a reasonable and predictable amount of time.     Precautions / Restrictions Precautions Precautions: Fall Restrictions Weight Bearing Restrictions: Yes RLE Weight Bearing: Non weight bearing      Mobility  Bed Mobility Overal bed mobility: Needs Assistance Bed Mobility: Rolling, Supine to Sit, Sit to Supine Rolling: Min assist (to left) Sidelying to sit: Min guard Supine to sit: Total assist, +2 for physical assistance Sit to supine: Total assist, +2 for physical assistance   General bed mobility comments: Increased time and increased frequency/repetition of cues. Pt reaching out for railing support.    Transfers Overall transfer level: Needs assistance Equipment used: Rolling walker (2 wheels) Transfers: Sit to/from Stand             General transfer comment: Unable to clear hips off bed to attempt full stand. Stedy utilized for safety. Pt able to reach forward to hold to center bar, however little to no effort for power up.    Ambulation/Gait               General Gait Details: Unable  Stairs            Wheelchair Mobility    Modified Rankin (Stroke Patients Only)       Balance Overall balance assessment: Needs assistance Sitting-balance support: Bilateral upper extremity supported (LLE on floor) Sitting balance-Leahy Scale: Zero Sitting balance - Comments: Max to total assist with brief (<10 seconds) periods of min guard assist. Jerky sitting balance with poor truncal activation.  Pertinent Vitals/Pain Pain Assessment Pain Assessment: Faces Faces Pain Scale: No hurt Pain Intervention(s): Monitored during session    Home Living Family/patient expects to be discharged to:: Skilled nursing facility Living Arrangements:  Spouse/significant other Available Help at Discharge: Family Type of Home: House Home Access: Level entry     Alternate Level Stairs-Number of Steps: 1 flight Home Layout: Two level;Bed/bath upstairs Home Equipment: None Additional Comments: All information taken from chart from one month ago, due to currently pt is inconsistent with answers or even answering questions. In speaking to RN she reports pt is currenlty separated from husband and sister [name] should be primary contact person with all medical information and sister is trying to get guardianship of pt's son 884 yo).    Prior Function Prior Level of Function : Independent/Modified Independent             Mobility Comments: Does not drive, no IADLs.  SCAT to dialysis ADLs Comments: Independent with ADLs/selfcare     Hand Dominance   Dominant Hand: Left    Extremity/Trunk Assessment   Upper Extremity Assessment Upper Extremity Assessment: RUE deficits/detail RUE Deficits / Details: edema in arm with fistula but moves okay and no c/o pain    Lower Extremity Assessment Lower Extremity Assessment: RLE deficits/detail RLE Deficits / Details: Residual limb wrapped and limb protector donned    Cervical / Trunk Assessment Cervical / Trunk Assessment: Normal  Communication   Communication: HOH (pt reporting she cannot hear (and that this is new). in chart there seems to be times when she can hear at normal tone and times she can't)  Cognition Arousal/Alertness: Lethargic Behavior During Therapy: Flat affect Overall Cognitive Status: Impaired/Different from baseline Area of Impairment: Orientation, Attention, Following commands, Safety/judgement, Awareness, Problem solving                 Orientation Level: Disoriented to, Time (January, could not tell us year--did know she was in hospital [facility]) with increased asking) Current Attention Level: Focused Memory: Decreased recall of precautions, Decreased short-term  memory Following Commands: Follows one step commands inconsistently, Follows one step commands with increased time Safety/Judgement: Decreased awareness of safety, Decreased awareness of deficits Awareness: Intellectual Problem Solving: Slow processing, Decreased initiation, Difficulty sequencing, Requires verbal cues, Requires tactile cues General Comments: Followed ~25% of one step commands, says she lives with her husband (but RN states differently)        General Comments      Exercises     Assessment/Plan    PT Assessment Patient needs continued PT services  PT Problem List Decreased strength;Decreased mobility;Decreased safety awareness;Decreased range of motion;Decreased activity tolerance;Decreased balance;Decreased cognition;Pain;Decreased knowledge of use of DME;Decreased knowledge of precautions       PT Treatment Interventions DME instruction;Therapeutic activities;Gait training;Therapeutic exercise;Patient/family education;Wheelchair mobility training;Functional mobility training;Balance training    PT Goals (Current goals can be found in the Care Plan section)  Acute Rehab PT Goals Patient Stated Goal: to go home PT Goal Formulation: With patient Time For Goal Achievement: 10/02/21 Potential to Achieve Goals: Fair    Frequency Min 3X/week     Co-evaluation PT/OT/SLP Co-Evaluation/Treatment: Yes Reason for Co-Treatment: Complexity of the patient's impairments (multi-system involvement);Necessary to address cognition/behavior during functional activity;For patient/therapist safety;To address functional/ADL transfers PT goals addressed during session: Mobility/safety with mobility;Balance;Proper use of DME;Strengthening/ROM OT goals addressed during session: ADL's and self-care;Strengthening/ROM       AM-PAC PT "6 Clicks" Mobility  Outcome Measure Help needed turning from your back to your  side while in a flat bed without using bedrails?: A Little Help needed  moving from lying on your back to sitting on the side of a flat bed without using bedrails?: Total Help needed moving to and from a bed to a chair (including a wheelchair)?: Total Help needed standing up from a chair using your arms (e.g., wheelchair or bedside chair)?: Total Help needed to walk in hospital room?: Total Help needed climbing 3-5 steps with a railing? : Total 6 Click Score: 8    End of Session Equipment Utilized During Treatment: Gait belt Activity Tolerance: Patient tolerated treatment well Patient left: in bed;with call bell/phone within reach;with bed alarm set Nurse Communication: Mobility status PT Visit Diagnosis: Other abnormalities of gait and mobility (R26.89);Muscle weakness (generalized) (M62.81);Difficulty in walking, not elsewhere classified (R26.2)    Time: 2841-3244 PT Time Calculation (min) (ACUTE ONLY): 23 min   Charges:   PT Evaluation $PT Eval Moderate Complexity: 1 Mod          Rolinda Roan, PT, DPT Acute Rehabilitation Services Pager: [phone] Office: [phone]   Thelma Comp 09/18/2021, 1:12 PM

## 2021-09-18 NOTE — Progress Notes (Addendum)
[facility]  Subjective  - Doesn't respond much, barely opens eyes.  No acute distress.   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

## 2021-09-18 NOTE — Progress Notes (Addendum)
Pt receives out-pt HD at [facility] on TTS. Pt arrives at 6:00 for 6:15 chair time. Clinic SW contacted navigator regarding pt's possible need for snf at d/c. Clinic concerns passed along to [facility] staff. Will assist as needed.   Melven Sartorius Renal Navigator [phone]

## 2021-09-18 NOTE — Progress Notes (Signed)
PROGRESS NOTE    [name]  EXH:[id number] DOB: [birth year] DOA: 09/15/2021 PCP: [facility]    Brief Narrative:  [name] is a 41 y.o. female with medical history significant for DMT2, ESRD with HD on Tu,Th, Sat, neuropathy, HTN with poor compliance with medical treatment and HD was sent after completing dialysis session today due to staff concern for altered mental status.  She was not speaking to them.  Details are obtained by EMS note, chart review and ER note.  During my visit patient was awake and oriented.  Every time I asked her a question for she would say she can hear even while I was speaking to her almost in a very loud voice she still would tell me she cannot hear.  When the nurse walked in and said yes you can hear, at that point patient miraculously could hear me with an normal tone conversation. Apparently she also did this to the ER attending.  2/4 responding appropriately this am. MRI called me about pt with osteo. 2/5 sleepy since she is post OR.  Status post amputation of right BKA today 2/6 patient has her eyes closed.  Is difficult to see if she is sleepy or she is intentionally tries not to respond.  When I asked her I know she is able to talk she asked me was the question.  I asked her if she is in pain she denied.  Consultants:  Nephrology, vascular surgery  Procedures:   Antimicrobials:  Vancomycin metronidazole, cefepime   Subjective: Denies shortness of breath or chest pain  Objective: Vitals:   09/17/21 1604 09/17/21 2051 09/18/21 0349 09/18/21 0726  BP: 109/69 110/66 105/68 106/63  Pulse: 85 85 83 83  Resp: 16  18 16   Temp: 98.1 F (36.7 C) 98.2 F (36.8 C) 98 F (36.7 C) 98.1 F (36.7 C)  TempSrc: Oral Oral Oral Oral  SpO2: 100% 97% 100% 100%  Weight: 54 kg     Height: 5\' 1"  (1.549 m)       Intake/Output Summary (Last 24 hours) at 09/18/2021 1529 Last data filed at 09/17/2021 1631 Gross per 24 hour  Intake 477  ml  Output --  Net 477 ml   Filed Weights   09/16/21 1335 09/16/21 1648 09/17/21 1604  Weight: 55 kg 54 kg 54 kg    Examination: Calm, NAD, eyes closed.  Cta no w/r Reg s1/s2 no gallop Soft benign +bs No edema Unable to assess neuro     Data Reviewed: I have personally reviewed following labs and imaging studies  CBC: Recent Labs  Lab 09/15/21 1547 09/16/21 0941 09/17/21 0126 09/18/21 0136  WBC 17.9* 19.9* 21.0* 15.0*  NEUTROABS 16.2*  --   --   --   HGB 9.5* 8.7* 8.4* 8.5*  HCT 31.0* 27.7* 28.8* 28.7*  MCV 88.8 87.4 91.7 90.5  PLT 260 300 270 381   Basic Metabolic Panel: Recent Labs  Lab 09/15/21 1547 09/17/21 0126 09/18/21 0136 09/18/21 0711 09/18/21 1104  NA 137 133* 127*  --   --   K 4.8 5.1 6.8* 4.9 4.2  CL 92* 93* 91*  --   --   CO2 21* 22 22  --   --   GLUCOSE 130* 273* 451*  --   --   BUN 26* 30* 54*  --   --   CREATININE 5.39* 4.85* 6.15*  --   --   CALCIUM 8.6* 8.6* 7.4*  --   --  PHOS  --   --  7.4*  --   --    GFR: Estimated Creatinine Clearance: 9.3 mL/min (A) (by C-G formula based on SCr of 6.15 mg/dL (H)). Liver Function Tests: Recent Labs  Lab 09/15/21 1547  AST 38  ALT <5  ALKPHOS 141*  BILITOT 1.0  PROT 8.0  ALBUMIN 1.7*   No results for input(s): LIPASE, AMYLASE in the last 168 hours. No results for input(s): AMMONIA in the last 168 hours. Coagulation Profile: No results for input(s): INR, PROTIME in the last 168 hours. Cardiac Enzymes: No results for input(s): CKTOTAL, CKMB, CKMBINDEX, TROPONINI in the last 168 hours. BNP (last 3 results) No results for input(s): PROBNP in the last 8760 hours. HbA1C: No results for input(s): HGBA1C in the last 72 hours. CBG: Recent Labs  Lab 09/17/21 2247 09/18/21 0020 09/18/21 0333 09/18/21 0756 09/18/21 1130  GLUCAP 475* 413* 424* 281* 330*   Lipid Profile: No results for input(s): CHOL, HDL, LDLCALC, TRIG, CHOLHDL, LDLDIRECT in the last 72 hours. Thyroid Function  Tests: No results for input(s): TSH, T4TOTAL, FREET4, T3FREE, THYROIDAB in the last 72 hours. Anemia Panel: No results for input(s): VITAMINB12, FOLATE, FERRITIN, TIBC, IRON, RETICCTPCT in the last 72 hours. Sepsis Labs: No results for input(s): PROCALCITON, LATICACIDVEN in the last 168 hours.  Recent Results (from the past 240 hour(s))  Resp Panel by RT-PCR (Flu A&B, Covid) Nasopharyngeal Swab     Status: None   Collection Time: 09/15/21  4:11 PM   Specimen: Nasopharyngeal Swab; Nasopharyngeal(NP) swabs in vial transport medium  Result Value Ref Range Status   SARS Coronavirus 2 by RT PCR NEGATIVE NEGATIVE Final    Comment: (NOTE) SARS-CoV-2 target nucleic acids are NOT DETECTED.  The SARS-CoV-2 RNA is generally detectable in upper respiratory specimens during the acute phase of infection. The lowest concentration of SARS-CoV-2 viral copies this assay can detect is 138 copies/mL. A negative result does not preclude SARS-Cov-2 infection and should not be used as the sole basis for treatment or other patient management decisions. A negative result may occur with  improper specimen collection/handling, submission of specimen other than nasopharyngeal swab, presence of viral mutation(s) within the areas targeted by this assay, and inadequate number of viral copies(<138 copies/mL). A negative result must be combined with clinical observations, patient history, and epidemiological information. The expected result is Negative.  Fact Sheet for Patients:  EntrepreneurPulse.com.au  Fact Sheet for Healthcare Providers:  IncredibleEmployment.be  This test is no t yet approved or cleared by the [place] FDA and  has been authorized for detection and/or diagnosis of SARS-CoV-2 by FDA under an Emergency Use Authorization (EUA). This EUA will remain  in effect (meaning this test can be used) for the duration of the COVID-19 declaration under Section  564(b)(1) of the Act, 21 U.S.C.section 360bbb-3(b)(1), unless the authorization is terminated  or revoked sooner.       Influenza A by PCR NEGATIVE NEGATIVE Final   Influenza B by PCR NEGATIVE NEGATIVE Final    Comment: (NOTE) The Xpert Xpress SARS-CoV-2/FLU/RSV plus assay is intended as an aid in the diagnosis of influenza from Nasopharyngeal swab specimens and should not be used as a sole basis for treatment. Nasal washings and aspirates are unacceptable for Xpert Xpress SARS-CoV-2/FLU/RSV testing.  Fact Sheet for Patients: EntrepreneurPulse.com.au  Fact Sheet for Healthcare Providers: IncredibleEmployment.be  This test is not yet approved or cleared by the [place] FDA and has been authorized for detection and/or diagnosis of SARS-CoV-2  by FDA under an Emergency Use Authorization (EUA). This EUA will remain in effect (meaning this test can be used) for the duration of the COVID-19 declaration under Section 564(b)(1) of the Act, 21 U.S.C. section 360bbb-3(b)(1), unless the authorization is terminated or revoked.  Performed at [facility], [place] [address] [place], [place] [address]   Culture, blood (routine x 2)     Status: None (Preliminary result)   Collection Time: 09/16/21  9:40 AM   Specimen: BLOOD  Result Value Ref Range Status   Specimen Description BLOOD RIGHT ANTECUBITAL  Final   Special Requests   Final    BOTTLES DRAWN AEROBIC AND ANAEROBIC Blood Culture adequate volume   Culture   Final    NO GROWTH 2 DAYS Performed at [facility], [place] [address] [place], [place] [address]    Report Status PENDING  Incomplete  Culture, blood (routine x 2)     Status: None (Preliminary result)   Collection Time: 09/16/21  9:42 AM   Specimen: BLOOD LEFT HAND  Result Value Ref Range Status   Specimen Description BLOOD LEFT HAND  Final   Special Requests   Final    BOTTLES DRAWN AEROBIC AND ANAEROBIC Blood Culture  results may not be optimal due to an inadequate volume of blood received in culture bottles   Culture   Final    NO GROWTH 2 DAYS Performed at [facility], [place] [address] [place], [place] [address]    Report Status PENDING  Incomplete  Surgical pcr screen     Status: None   Collection Time: 09/17/21  6:16 AM   Specimen: Nasal Mucosa; Nasal Swab  Result Value Ref Range Status   MRSA, PCR NEGATIVE NEGATIVE Final   Staphylococcus aureus NEGATIVE NEGATIVE Final    Comment: (NOTE) The Xpert SA Assay (FDA approved for NASAL specimens in patients 56 years of age and older), is one component of a comprehensive surveillance program. It is not intended to diagnose infection nor to guide or monitor treatment. Performed at [facility], [place] [address], [place], [place] [address]          Radiology Studies: No results found.      Scheduled Meds:  calcitRIOL  0.25 mcg Oral BID   Chlorhexidine Gluconate Cloth  6 each Topical Q0600   [START ON 09/19/2021] Chlorhexidine Gluconate Cloth  6 each Topical Q0600   cinacalcet  90 mg Oral Q breakfast   docusate sodium  100 mg Oral Daily   dorzolamide-timolol  1 drop Left Eye BID   feeding supplement (NEPRO CARB STEADY)  237 mL Oral BID BM   gabapentin  100 mg Oral TID   heparin injection (subcutaneous)  5,000 Units Subcutaneous Q8H   insulin aspart  0-6 Units Subcutaneous Q4H   insulin glargine-yfgn  5 Units Subcutaneous Daily   lidocaine-prilocaine  1 application Topical UD   losartan  50 mg Oral Daily   metroNIDAZOLE  500 mg Oral Q12H   multivitamin  1 tablet Oral Daily   naproxen  250 mg Oral BID WC   pantoprazole  40 mg Oral Daily   sevelamer carbonate  1.6 g Oral TID WC   traMADol  100 mg Oral Q12H   Continuous Infusions:  ceFEPime (MAXIPIME) IV Stopped (09/17/21 2109)   magnesium sulfate bolus IVPB     [START ON 09/19/2021] vancomycin      Assessment & Plan:   Principal Problem:   Osteomyelitis ([place])  Active  Problems:   ESRD on hemodialysis ([place])   Status post amputation of toe of right foot (HCC)   Hyperglycemia due to type 2 diabetes mellitus ([place])   Anemia of chronic disease   Altered MS-POA Resolved by the time I saw her.  Etiology unclear. Certainly possibly foot infection may play role but now MS resolved. 2/6 mental status has been at baseline.  Today I am unable to assess her neuro exam due to her uncooperation.  It is difficult to say if she is confused or she intentionally is ignoring the exam.  At times she tries to respond to me.          2. Rt foot infection POA X-ray concerning for osteomyelitis, recommended MRI MRI ordered and pending Vascular surgery to see patient over the weekend with possible amputation planning 2/5 s/p amputation RT bk today by Dr. Trula Slade  2/6 WBC trending down See MRI result Now that she is right BKA, likely does not need antibiotics will discontinue      3. ESRD POA Nephrology following 2/5 underwent dialysis to make up missed treatment on Thursday and HD planned for today We will resume TTS schedule with next dialysis 2/7 2/6 falsely elevated potassium.  No acute indication for HD today as repeat potassium was stable.  Plan for HD tomorrow per regular schedule    4.Anemia of chronic disease POA Hemoglobin at baseline continue to monitor     5.Diabetes mellitus type 2 with hyperglycemia POA BG elevated Increase Semglee to 10 units daily Continue R-ISS   6.Hypoalbuminemia POA Albumin 1.7  nutrition consult   7.AG metabolic acidosis POA Likely from infection and missed HD Improved      DVT prophylaxis: Heparin Code Status: Full Family Communication: None at bedside Disposition Plan:  Status is: Inpatient Remains inpatient appropriate because: IV treatment.  Will need SNF                LOS: 3 days   Time spent: 35 min     Nolberto Hanlon, MD [facility] Pager [phone]  If 7PM-7AM, please  contact night-coverage 09/18/2021, 3:29 PM

## 2021-09-19 DIAGNOSIS — N186 End stage renal disease: Secondary | ICD-10-CM | POA: Diagnosis not present

## 2021-09-19 DIAGNOSIS — M869 Osteomyelitis, unspecified: Secondary | ICD-10-CM | POA: Diagnosis not present

## 2021-09-19 DIAGNOSIS — E1165 Type 2 diabetes mellitus with hyperglycemia: Secondary | ICD-10-CM | POA: Diagnosis not present

## 2021-09-19 DIAGNOSIS — D638 Anemia in other chronic diseases classified elsewhere: Secondary | ICD-10-CM | POA: Diagnosis not present

## 2021-09-19 LAB — CBC
HCT: 28.7 % — ABNORMAL LOW (ref 36.0–46.0)
Hemoglobin: 8.4 g/dL — ABNORMAL LOW (ref 12.0–15.0)
MCH: 26.7 pg (ref 26.0–34.0)
MCHC: 29.3 g/dL — ABNORMAL LOW (ref 30.0–36.0)
MCV: 91.1 fL (ref 80.0–100.0)
Platelets: 254 10*3/uL (ref 150–400)
RBC: 3.15 MIL/uL — ABNORMAL LOW (ref 3.87–5.11)
RDW: 22.1 % — ABNORMAL HIGH (ref 11.5–15.5)
WBC: 16.5 10*3/uL — ABNORMAL HIGH (ref 4.0–10.5)
nRBC: 1.9 % — ABNORMAL HIGH (ref 0.0–0.2)

## 2021-09-19 LAB — BASIC METABOLIC PANEL
Anion gap: 16 — ABNORMAL HIGH (ref 5–15)
BUN: 72 mg/dL — ABNORMAL HIGH (ref 6–20)
CO2: 22 mmol/L (ref 22–32)
Calcium: 7.7 mg/dL — ABNORMAL LOW (ref 8.9–10.3)
Chloride: 96 mmol/L — ABNORMAL LOW (ref 98–111)
Creatinine, Ser: 7.87 mg/dL — ABNORMAL HIGH (ref 0.44–1.00)
GFR, Estimated: 6 mL/min — ABNORMAL LOW (ref >60)
Glucose, Bld: 216 mg/dL — ABNORMAL HIGH (ref 70–99)
Potassium: 4.6 mmol/L (ref 3.5–5.1)
Sodium: 134 mmol/L — ABNORMAL LOW (ref 135–145)

## 2021-09-19 LAB — GLUCOSE, CAPILLARY
Glucose-Capillary: 124 mg/dL — ABNORMAL HIGH (ref 70–99)
Glucose-Capillary: 180 mg/dL — ABNORMAL HIGH (ref 70–99)
Glucose-Capillary: 188 mg/dL — ABNORMAL HIGH (ref 70–99)
Glucose-Capillary: 255 mg/dL — ABNORMAL HIGH (ref 70–99)
Glucose-Capillary: 273 mg/dL — ABNORMAL HIGH (ref 70–99)
Glucose-Capillary: 312 mg/dL — ABNORMAL HIGH (ref 70–99)

## 2021-09-19 LAB — POTASSIUM
Potassium: 3.6 mmol/L (ref 3.5–5.1)
Potassium: >7.5 mmol/L (ref 3.5–5.1)

## 2021-09-19 LAB — SURGICAL PATHOLOGY

## 2021-09-19 LAB — HEPATITIS B SURFACE ANTIBODY,QUALITATIVE: Hep B S Ab: REACTIVE — AB

## 2021-09-19 LAB — HEPATITIS B SURFACE ANTIGEN: Hepatitis B Surface Ag: NONREACTIVE

## 2021-09-19 MED ORDER — PENTAFLUOROPROP-TETRAFLUOROETH EX AERO: 1.0000 "application " | INHALATION_SPRAY | CUTANEOUS | Status: DC | PRN

## 2021-09-19 MED ORDER — LIDOCAINE-PRILOCAINE 2.5-2.5 % EX CREA: 1.0000 "application " | TOPICAL_CREAM | CUTANEOUS | Status: DC | PRN

## 2021-09-19 MED ORDER — SODIUM CHLORIDE 0.9 % IV SOLN: 100.0000 mL | INTRAVENOUS | Status: DC | PRN

## 2021-09-19 MED ORDER — SODIUM CHLORIDE 0.9 % IV BOLUS
250.0000 mL | Freq: Once | INTRAVENOUS | Status: AC
  Administered 2021-09-19: 250 mL via INTRAVENOUS

## 2021-09-19 MED ORDER — LIDOCAINE HCL (PF) 1 % IJ SOLN: 5.0000 mL | INTRAMUSCULAR | Status: DC | PRN

## 2021-09-19 MED ORDER — HEPARIN SODIUM (PORCINE) 1000 UNIT/ML DIALYSIS: 1000.0000 [IU] | INTRAMUSCULAR | Status: DC | PRN

## 2021-09-19 MED ORDER — CINACALCET HCL 30 MG PO TABS
90.0000 mg | ORAL_TABLET | Freq: Every day | ORAL | Status: DC
  Administered 2021-09-19 – 2021-10-24 (×24): 90 mg via ORAL
  Filled 2021-09-19 (×37): qty 3

## 2021-09-19 MED ORDER — PROSOURCE PLUS PO LIQD
30.0000 mL | Freq: Two times a day (BID) | ORAL | Status: DC
  Administered 2021-09-19 – 2021-09-20 (×4): 30 mL via ORAL
  Filled 2021-09-19 (×6): qty 30

## 2021-09-19 MED ORDER — ALTEPLASE 2 MG IJ SOLR: 2.0000 mg | Freq: Once | INTRAMUSCULAR | Status: DC | PRN

## 2021-09-19 NOTE — Progress Notes (Signed)
[facility] Progress Note   Subjective:   Patient seen and examined at bedside during dialysis. Remains somnolent. No specific complaints.    Objective Vitals:   09/19/21 0406 09/19/21 0748 09/19/21 0752 09/19/21 0830  BP: 90/61 (!) 102/54 (!) 94/55 95/60  Pulse: 79   76  Resp: 18 19 20    Temp: 98 F (36.7 C) 97.7 F (36.5 C)    TempSrc: Oral Oral    SpO2: 99% 99%    Weight:  55.2 kg    Height:       Physical Exam General:somnolent, chronically ill appearing female in NAD Heart:RRR, no mrg Lungs:CTAB, nml WOB on RA Abdomen:soft, NTND Extremities:no LE edema, R BKA in cannister Dialysis Access: RU AVF in use   Filed Weights   09/16/21 1648 09/17/21 1604 09/19/21 0748  Weight: 54 kg 54 kg 55.2 kg    Intake/Output Summary (Last 24 hours) at 09/19/2021 0850 Last data filed at 09/19/2021 0601 Gross per 24 hour  Intake 349.43 ml  Output --  Net 349.43 ml    Additional Objective Labs: Basic Metabolic Panel: Recent Labs  Lab 09/17/21 0126 09/18/21 0136 09/18/21 0711 09/18/21 1104 09/19/21 0332  NA 133* 127*  --   --  134*  K 5.1 6.8* 4.9 4.2 4.6  CL 93* 91*  --   --  96*  CO2 22 22  --   --  22  GLUCOSE 273* 451*  --   --  216*  BUN 30* 54*  --   --  72*  CREATININE 4.85* 6.15*  --   --  7.87*  CALCIUM 8.6* 7.4*  --   --  7.7*  PHOS  --  7.4*  --   --   --    Liver Function Tests: Recent Labs  Lab 09/15/21 1547  AST 38  ALT <5  ALKPHOS 141*  BILITOT 1.0  PROT 8.0  ALBUMIN 1.7*    CBC: Recent Labs  Lab 09/15/21 1547 09/16/21 0941 09/17/21 0126 09/18/21 0136 09/19/21 0332  WBC 17.9* 19.9* 21.0* 15.0* 16.5*  NEUTROABS 16.2*  --   --   --   --   HGB 9.5* 8.7* 8.4* 8.5* 8.4*  HCT 31.0* 27.7* 28.8* 28.7* 28.7*  MCV 88.8 87.4 91.7 90.5 91.1  PLT 260 300 270 322 254   Blood Culture    Component Value Date/Time   SDES BLOOD LEFT HAND 09/16/2021 0942   SPECREQUEST  09/16/2021 0942    BOTTLES DRAWN AEROBIC AND ANAEROBIC Blood  Culture results may not be optimal due to an inadequate volume of blood received in culture bottles   CULT  09/16/2021 0942    NO GROWTH 3 DAYS Performed at [facility], [place] [address] [place], [place] [address]    REPTSTATUS PENDING 09/16/2021 5974    CBG: Recent Labs  Lab 09/18/21 1130 09/18/21 1637 09/18/21 2039 09/19/21 0003 09/19/21 0405  GLUCAP 330* 292* 289* 273* 180*    Medications:  sodium chloride     sodium chloride     magnesium sulfate bolus IVPB      calcitRIOL  0.25 mcg Oral BID   Chlorhexidine Gluconate Cloth  6 each Topical Q0600   Chlorhexidine Gluconate Cloth  6 each Topical Q0600   cinacalcet  90 mg Oral Q breakfast   docusate sodium  100 mg Oral Daily   dorzolamide-timolol  1 drop Left Eye BID   feeding supplement (NEPRO CARB STEADY)  237 mL Oral BID  BM   gabapentin  100 mg Oral TID   heparin injection (subcutaneous)  5,000 Units Subcutaneous Q8H   insulin aspart  0-6 Units Subcutaneous Q4H   insulin glargine-yfgn  10 Units Subcutaneous Daily   lidocaine-prilocaine  1 application Topical UD   losartan  50 mg Oral Daily   multivitamin  1 tablet Oral Daily   naproxen  250 mg Oral BID WC   pantoprazole  40 mg Oral Daily   sevelamer carbonate  1.6 g Oral TID WC   traMADol  100 mg Oral Q12H    Dialysis Orders: [facility] TTS, 3 hours 45 minutes, BFR 400/DFR 600, EDW 59.5 kg, 2K/2Ca UFP2   RU AV fistula 4000 unit bolus heparin Mircera 200 mcg IV every 2 weeks - last 1/26 Ancef 2 g 3 times weekly HD   Assessment/Plan: 1. 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 Trula Slade.  On maxipime and vancomycin.  2. ESRD - on HD TTS.  HD today per regular schedule. 3. Anemia of CKD- Hgb 8.5 pre surgery.  ESA due Thursday.  Transfuse pRBC prn  4. Secondary hyperparathyroidism - Corrected Ca ok, phos elevated.  Calcitriol 0.87mcg qd started. On renvela and sensipar 90mg  qd.   5. HTN/volume - Blood pressure well controlled.  Does  not appear volume overloaded.  UF as tolerated.  Will need lower dry weight on d/c post amputation, ~55kg 6. Severe protein calorie malnutrition - last Alb 1.7, give protein supplements.  Renal diet w/fluid restrictions.  7. AMS/somnolence - etiology unclear. ?psych 8. DMT2 - per PMD  Jen Mow, PA-C [facility] 09/19/2021,8:50 AM  LOS: 4 days

## 2021-09-19 NOTE — Progress Notes (Signed)
Date and time results received: 09/19/21 2005  Reported by: Jenny Reichmann , Lab Reported to: Grayce Sessions, RN   Test: potassium (K+) Critical Value: >7.5  Name of Provider Notified: Clarene Essex   Orders Received? Or Actions Taken?:  MD notified, awaiting orders

## 2021-09-19 NOTE — TOC Progression Note (Signed)
Transition of Care [facility]) - Progression Note    Patient Details  Name: Darsi Tien MRN: [id number] Date of Birth: [birth year]  Transition of Care [facility]) CM/SW Contact  Emeterio Reeve, [place] Phone Number: 09/19/2021, 2:12 PM  Clinical Narrative:     CSW called pts sister twice. CSW was unable to leave message due to voicemail being full.   Expected Discharge Plan: [place] Barriers to Discharge: Continued Medical Work up, SNF Pending bed offer  Expected Discharge Plan and Services Expected Discharge Plan: [place] In-house Referral: Clinical Social Work   Post Acute Care Choice: [place] Living arrangements for the past 2 months: Apartment                                       Social Determinants of Health (SDOH) Interventions    Readmission Risk Interventions Readmission Risk Prevention Plan 08/23/2021  Transportation Screening Complete  HRI or [place] Complete  Social Work Consult for [place] Planning/Counseling Complete  Palliative Care Screening Not Applicable  Medication Review [employer]) Complete  Some recent data might be hidden   Emeterio Reeve, [employer] Clinical Social Worker

## 2021-09-19 NOTE — Progress Notes (Signed)
PROGRESS NOTE    [name]  HBZ:[id number] DOB: 1980/12/10 DOA: 09/15/2021 PCP: [facility], [facility]    Brief Narrative:  [name] is a 41 y.o. female with medical history significant for DMT2, ESRD with HD on Tu,Th, Sat, neuropathy, HTN with poor compliance with medical treatment and HD was sent after completing dialysis session today due to staff concern for altered mental status.  She was not speaking to them.  Details are obtained by EMS note, chart review and ER note.  During my visit patient was awake and oriented.  Every time I asked her a question for she would say she can hear even while I was speaking to her almost in a very loud voice she still would tell me she cannot hear.  When the nurse walked in and said yes you can hear, at that point patient miraculously could hear me with an normal tone conversation. Apparently she also did this to the ER attending. She is s/p BKA now.  Awaiting SNF  2/4 MRI called me about pt with osteo. 2/5 sleepy since she is post OR.  Status post amputation of right BKA today 2/7 HD today  Consultants:  Nephrology, vascular surgery  Procedures:   Antimicrobials:  Vancomycin metronidazole, cefepim...>d/c'd after BKQA   Subjective: Today she responds "what" 'what" to every question I ask her, then when I told her I know she can hear me, she said oh you ask "how I am doing?". Then no further participation with exam or answering my questions  Objective: Vitals:   09/19/21 1045 09/19/21 1058 09/19/21 1103 09/19/21 1641  BP: (!) 85/55 116/61 116/61 (!) 86/59  Pulse:  75  87  Resp:  16  15  Temp:  (!) 97.5 F (36.4 C)  (!) 97.5 F (36.4 C)  TempSrc:  Oral  Oral  SpO2:  99%  (!) 87%  Weight:  57.3 kg    Height:        Intake/Output Summary (Last 24 hours) at 09/19/2021 1654 Last data filed at 09/19/2021 1300 Gross per 24 hour  Intake 469.43 ml  Output 948 ml  Net -478.57 ml   Filed Weights   09/17/21 1604 09/19/21  0748 09/19/21 1058  Weight: 54 kg 58.2 kg 57.3 kg    Examination: Calm, NAD Cta no w/r Reg s1/s2 no gallop Soft benign +bs R bka, left no edema Unable to assess Mood and affect appropriate in current setting, has been saying all along since admission     Data Reviewed: I have personally reviewed following labs and imaging studies  CBC: Recent Labs  Lab 09/15/21 1547 09/16/21 0941 09/17/21 0126 09/18/21 0136 09/19/21 0332  WBC 17.9* 19.9* 21.0* 15.0* 16.5*  NEUTROABS 16.2*  --   --   --   --   HGB 9.5* 8.7* 8.4* 8.5* 8.4*  HCT 31.0* 27.7* 28.8* 28.7* 28.7*  MCV 88.8 87.4 91.7 90.5 91.1  PLT 260 300 270 322 101   Basic Metabolic Panel: Recent Labs  Lab 09/15/21 1547 09/17/21 0126 09/18/21 0136 09/18/21 0711 09/18/21 1104 09/19/21 0332 09/19/21 1347  NA 137 133* 127*  --   --  134*  --   K 4.8 5.1 6.8* 4.9 4.2 4.6 3.6  CL 92* 93* 91*  --   --  96*  --   CO2 21* 22 22  --   --  22  --   GLUCOSE 130* 273* 451*  --   --  216*  --  BUN 26* 30* 54*  --   --  72*  --   CREATININE 5.39* 4.85* 6.15*  --   --  7.87*  --   CALCIUM 8.6* 8.6* 7.4*  --   --  7.7*  --   PHOS  --   --  7.4*  --   --   --   --    GFR: Estimated Creatinine Clearance: 7.2 mL/min (A) (by C-G formula based on SCr of 7.87 mg/dL (H)). Liver Function Tests: Recent Labs  Lab 09/15/21 1547  AST 38  ALT <5  ALKPHOS 141*  BILITOT 1.0  PROT 8.0  ALBUMIN 1.7*   No results for input(s): LIPASE, AMYLASE in the last 168 hours. No results for input(s): AMMONIA in the last 168 hours. Coagulation Profile: No results for input(s): INR, PROTIME in the last 168 hours. Cardiac Enzymes: No results for input(s): CKTOTAL, CKMB, CKMBINDEX, TROPONINI in the last 168 hours. BNP (last 3 results) No results for input(s): PROBNP in the last 8760 hours. HbA1C: No results for input(s): HGBA1C in the last 72 hours. CBG: Recent Labs  Lab 09/18/21 2039 09/19/21 0003 09/19/21 0405 09/19/21 1242  09/19/21 1638  GLUCAP 289* 273* 180* 124* 188*   Lipid Profile: No results for input(s): CHOL, HDL, LDLCALC, TRIG, CHOLHDL, LDLDIRECT in the last 72 hours. Thyroid Function Tests: No results for input(s): TSH, T4TOTAL, FREET4, T3FREE, THYROIDAB in the last 72 hours. Anemia Panel: No results for input(s): VITAMINB12, FOLATE, FERRITIN, TIBC, IRON, RETICCTPCT in the last 72 hours. Sepsis Labs: No results for input(s): PROCALCITON, LATICACIDVEN in the last 168 hours.  Recent Results (from the past 240 hour(s))  Resp Panel by RT-PCR (Flu A&B, Covid) Nasopharyngeal Swab     Status: None   Collection Time: 09/15/21  4:11 PM   Specimen: Nasopharyngeal Swab; Nasopharyngeal(NP) swabs in vial transport medium  Result Value Ref Range Status   SARS Coronavirus 2 by RT PCR NEGATIVE NEGATIVE Final    Comment: (NOTE) SARS-CoV-2 target nucleic acids are NOT DETECTED.  The SARS-CoV-2 RNA is generally detectable in upper respiratory specimens during the acute phase of infection. The lowest concentration of SARS-CoV-2 viral copies this assay can detect is 138 copies/mL. A negative result does not preclude SARS-Cov-2 infection and should not be used as the sole basis for treatment or other patient management decisions. A negative result may occur with  improper specimen collection/handling, submission of specimen other than nasopharyngeal swab, presence of viral mutation(s) within the areas targeted by this assay, and inadequate number of viral copies(<138 copies/mL). A negative result must be combined with clinical observations, patient history, and epidemiological information. The expected result is Negative.  Fact Sheet for Patients:  EntrepreneurPulse.com.au  Fact Sheet for Healthcare Providers:  IncredibleEmployment.be  This test is no t yet approved or cleared by the [place] FDA and  has been authorized for detection and/or diagnosis of SARS-CoV-2  by FDA under an Emergency Use Authorization (EUA). This EUA will remain  in effect (meaning this test can be used) for the duration of the COVID-19 declaration under Section 564(b)(1) of the Act, 21 U.S.C.section 360bbb-3(b)(1), unless the authorization is terminated  or revoked sooner.       Influenza A by PCR NEGATIVE NEGATIVE Final   Influenza B by PCR NEGATIVE NEGATIVE Final    Comment: (NOTE) The Xpert Xpress SARS-CoV-2/FLU/RSV plus assay is intended as an aid in the diagnosis of influenza from Nasopharyngeal swab specimens and should not be used as a sole  basis for treatment. Nasal washings and aspirates are unacceptable for Xpert Xpress SARS-CoV-2/FLU/RSV testing.  Fact Sheet for Patients: EntrepreneurPulse.com.au  Fact Sheet for Healthcare Providers: IncredibleEmployment.be  This test is not yet approved or cleared by the [place] FDA and has been authorized for detection and/or diagnosis of SARS-CoV-2 by FDA under an Emergency Use Authorization (EUA). This EUA will remain in effect (meaning this test can be used) for the duration of the COVID-19 declaration under Section 564(b)(1) of the Act, 21 U.S.C. section 360bbb-3(b)(1), unless the authorization is terminated or revoked.  Performed at [facility], [place] [address] [place], [place] [address]   Culture, blood (routine x 2)     Status: None (Preliminary result)   Collection Time: 09/16/21  9:40 AM   Specimen: BLOOD  Result Value Ref Range Status   Specimen Description BLOOD RIGHT ANTECUBITAL  Final   Special Requests   Final    BOTTLES DRAWN AEROBIC AND ANAEROBIC Blood Culture adequate volume   Culture   Final    NO GROWTH 3 DAYS Performed at [facility], [place] [address], [place], [place] [address]    Report Status PENDING  Incomplete  Culture, blood (routine x 2)     Status: None (Preliminary result)   Collection Time: 09/16/21  9:42 AM   Specimen: BLOOD  LEFT HAND  Result Value Ref Range Status   Specimen Description BLOOD LEFT HAND  Final   Special Requests   Final    BOTTLES DRAWN AEROBIC AND ANAEROBIC Blood Culture results may not be optimal due to an inadequate volume of blood received in culture bottles   Culture   Final    NO GROWTH 3 DAYS Performed at [facility], [place] [address] [place], [place] [address]    Report Status PENDING  Incomplete  Surgical pcr screen     Status: None   Collection Time: 09/17/21  6:16 AM   Specimen: Nasal Mucosa; Nasal Swab  Result Value Ref Range Status   MRSA, PCR NEGATIVE NEGATIVE Final   Staphylococcus aureus NEGATIVE NEGATIVE Final    Comment: (NOTE) The Xpert SA Assay (FDA approved for NASAL specimens in patients 90 years of age and older), is one component of a comprehensive surveillance program. It is not intended to diagnose infection nor to guide or monitor treatment. Performed at [facility], [place] [address] [place],  [address]          Radiology Studies: No results found.      Scheduled Meds:  (feeding supplement) PROSource Plus  30 mL Oral BID BM   calcitRIOL  0.25 mcg Oral BID   Chlorhexidine Gluconate Cloth  6 each Topical Q0600   Chlorhexidine Gluconate Cloth  6 each Topical Q0600   cinacalcet  90 mg Oral Q supper   docusate sodium  100 mg Oral Daily   dorzolamide-timolol  1 drop Left Eye BID   feeding supplement (NEPRO CARB STEADY)  237 mL Oral BID BM   gabapentin  100 mg Oral TID   heparin injection (subcutaneous)  5,000 Units Subcutaneous Q8H   insulin aspart  0-6 Units Subcutaneous Q4H   insulin glargine-yfgn  10 Units Subcutaneous Daily   lidocaine-prilocaine  1 application Topical UD   losartan  50 mg Oral Daily   multivitamin  1 tablet Oral Daily   naproxen  250 mg Oral BID WC   pantoprazole  40 mg Oral Daily   sevelamer carbonate  1.6 g  Oral TID WC   traMADol  100 mg Oral Q12H   Continuous Infusions:  sodium chloride     sodium  chloride     magnesium sulfate bolus IVPB      Assessment & Plan:   Principal Problem:   Osteomyelitis ([place]) Active Problems:   ESRD on hemodialysis (HCC)   Status post amputation of toe of right foot (HCC)   Hyperglycemia due to type 2 diabetes mellitus ([place])   Anemia of chronic disease   Altered MS-POA Resolved by the time I saw her.  Etiology unclear. Certainly possibly foot infection may play role but now MS resolved. 2/7 difficult to assess for mental status daily since that time she does not want to participate answering questions or with the exam.   It was better than when she first came in and appears to be at her baseline At times she does not like answering questions makes it a bit challenging        2. Rt foot infection POA X-ray concerning for osteomyelitis, recommended MRI 2/7See MRI result prior to surgery Status post amputation right BKA on 2/5 by Dr. Trula Slade  Abx discontinued since BKA Wbc little up, monitor if continues going up may consider resuming antibiotics and consulting ID        3. ESRD POA Nephrology following 2/5 underwent dialysis to make up missed treatment on Thursday and HD planned for today We will resume TTS schedule with next dialysis 2/7 2/6 falsely elevated potassium.  No acute indication for HD today as repeat potassium was stable.   2/7 had dialysis today.  Potassium stable.     4.Anemia of chronic disease POA Hemoglobin at baseline        5.Diabetes mellitus type 2 with hyperglycemia POA BG better controlled Continue Semgle[place]  Continue R-ISS   6.Hypoalbuminemia POA Albumin 1.7  nutrition consulted   7.AG metabolic acidosis POA Likely from infection and missed HD Improved      DVT prophylaxis: Heparin Code Status: Full Family Communication: None at bedside Disposition Plan:  Status is: Inpatient Remains inpatient appropriate because: SNF pending.  Monitor labs                LOS: 4 days    Time spent: 35 min     Nolberto Hanlon, MD [facility] Pager [phone]  If 7PM-7AM, please contact night-coverage 09/19/2021, 4:54 PM

## 2021-09-19 NOTE — Progress Notes (Signed)
Hemodialysis= Patient tolerated well. Unable to meet uf goal due to hypotension, relieved with saline bolus x2 and uf off. Total approx 950cc removed. Reported off to [employer]. Patient remains sleepy but follows commands and answers most questions appropriately. Denies pain.

## 2021-09-20 DIAGNOSIS — M869 Osteomyelitis, unspecified: Secondary | ICD-10-CM | POA: Diagnosis not present

## 2021-09-20 LAB — POTASSIUM
Potassium: 4.4 mmol/L (ref 3.5–5.1)
Potassium: 4 mmol/L (ref 3.5–5.1)
Potassium: 4.1 mmol/L (ref 3.5–5.1)
Potassium: 4.5 mmol/L (ref 3.5–5.1)

## 2021-09-20 LAB — GLUCOSE, CAPILLARY
Glucose-Capillary: 248 mg/dL — ABNORMAL HIGH (ref 70–99)
Glucose-Capillary: 146 mg/dL — ABNORMAL HIGH (ref 70–99)
Glucose-Capillary: 163 mg/dL — ABNORMAL HIGH (ref 70–99)
Glucose-Capillary: 181 mg/dL — ABNORMAL HIGH (ref 70–99)
Glucose-Capillary: 211 mg/dL — ABNORMAL HIGH (ref 70–99)

## 2021-09-20 LAB — HEPATITIS B SURFACE ANTIBODY, QUANTITATIVE: Hep B S AB Quant (Post): >1000 m[IU]/mL (ref >9.9)

## 2021-09-20 MED ORDER — CHLORHEXIDINE GLUCONATE CLOTH 2 % EX PADS
6.0000 | MEDICATED_PAD | Freq: Every day | CUTANEOUS | Status: DC
  Administered 2021-09-21 – 2021-09-22 (×3): 6 via TOPICAL

## 2021-09-20 NOTE — Progress Notes (Signed)
[facility] Progress Note   Subjective:   Patient seen and examined at bedside.  Sleeping soundly, snoring.  Awakes after repeat stimuli but doesn't open eyes.  No complaints.   Objective Vitals:   09/19/21 2015 09/19/21 2049 09/20/21 0524 09/20/21 0756  BP: (!) 99/23 (!) 88/46 (!) 85/58 99/68  Pulse: 79  76 80  Resp: 17  17 15   Temp: 98.4 F (36.9 C)  97.9 F (36.6 C) (!) 97.4 F (36.3 C)  TempSrc: Oral  Oral Oral  SpO2:   99% 100%  Weight:      Height:       Physical Exam General:chronically ill appearing female, sleeping and in NAD Heart:RRR, no mrg Lungs:CTAB, nml WOB Abdomen:soft, NTND Extremities: trace RLE edema, L BKA in cannister  Dialysis Access: RU AVF +b/t   Filed Weights   09/17/21 1604 09/19/21 0748 09/19/21 1058  Weight: 54 kg 58.2 kg 57.3 kg    Intake/Output Summary (Last 24 hours) at 09/20/2021 1153 Last data filed at 09/20/2021 0800 Gross per 24 hour  Intake 470 ml  Output 0 ml  Net 470 ml    Additional Objective Labs: Basic Metabolic Panel: Recent Labs  Lab 09/17/21 0126 09/18/21 0136 09/18/21 0711 09/19/21 0332 09/19/21 1347 09/19/21 1904 09/20/21 0342 09/20/21 0830  NA 133* 127*  --  134*  --   --   --   --   K 5.1 6.8*   < > 4.6   < > >7.5* 4.4 4.0  CL 93* 91*  --  96*  --   --   --   --   CO2 22 22  --  22  --   --   --   --   GLUCOSE 273* 451*  --  216*  --   --   --   --   BUN 30* 54*  --  72*  --   --   --   --   CREATININE 4.85* 6.15*  --  7.87*  --   --   --   --   CALCIUM 8.6* 7.4*  --  7.7*  --   --   --   --   PHOS  --  7.4*  --   --   --   --   --   --    < > = values in this interval not displayed.   Liver Function Tests: Recent Labs  Lab 09/15/21 1547  AST 38  ALT <5  ALKPHOS 141*  BILITOT 1.0  PROT 8.0  ALBUMIN 1.7*   CBC: Recent Labs  Lab 09/15/21 1547 09/16/21 0941 09/17/21 0126 09/18/21 0136 09/19/21 0332  WBC 17.9* 19.9* 21.0* 15.0* 16.5*  NEUTROABS 16.2*  --   --   --   --   HGB  9.5* 8.7* 8.4* 8.5* 8.4*  HCT 31.0* 27.7* 28.8* 28.7* 28.7*  MCV 88.8 87.4 91.7 90.5 91.1  PLT 260 300 270 322 254   CBG: Recent Labs  Lab 09/19/21 2020 09/19/21 2319 09/20/21 0321 09/20/21 0754 09/20/21 1149  GLUCAP 255* 312* 248* 146* 163*    Medications:  sodium chloride     sodium chloride     magnesium sulfate bolus IVPB      (feeding supplement) PROSource Plus  30 mL Oral BID BM   calcitRIOL  0.25 mcg Oral BID   Chlorhexidine Gluconate Cloth  6 each Topical Q0600   Chlorhexidine Gluconate Cloth  6 each Topical Q0600  cinacalcet  90 mg Oral Q supper   docusate sodium  100 mg Oral Daily   dorzolamide-timolol  1 drop Left Eye BID   feeding supplement (NEPRO CARB STEADY)  237 mL Oral BID BM   gabapentin  100 mg Oral TID   heparin injection (subcutaneous)  5,000 Units Subcutaneous Q8H   insulin aspart  0-6 Units Subcutaneous Q4H   insulin glargine-yfgn  10 Units Subcutaneous Daily   lidocaine-prilocaine  1 application Topical UD   multivitamin  1 tablet Oral Daily   naproxen  250 mg Oral BID WC   pantoprazole  40 mg Oral Daily   sevelamer carbonate  1.6 g Oral TID WC   traMADol  100 mg Oral Q12H    Dialysis Orders: [facility] TTS, 3 hours 45 minutes, BFR 400/DFR 600, EDW 59.5 kg, 2K/2Ca UFP2   RU AV fistula 4000 unit bolus heparin Mircera 200 mcg IV every 2 weeks - last 1/26 Ancef 2 g 3 times weekly HD   Assessment/Plan: 1. 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 Trula Slade.   2. ESRD - on HD TTS.  HD today per regular schedule. K 4.0. 3. Anemia of CKD- Last Hgb 8.4.  ESA due Thursday.  Transfuse pRBC prn  4. Secondary hyperparathyroidism - Corrected Ca ok, phos elevated.  Calcitriol 0.40mcg qd started. On renvela and sensipar 90mg  qd.   5. HTN/volume - Blood pressure soft without medications.  Does not appear volume overloaded.  UF as tolerated.  Will need lower dry weight on d/c post amputation, ~55kg.  Use albumin for BP support with HD. 6.  Severe protein calorie malnutrition - last Alb 1.7, give protein supplements.  Renal diet w/fluid restrictions.  7. AMS/somnolence - etiology unclear. ?psych 8. DMT2 - per PMD   Jen Mow, PA-C [facility] 09/20/2021,11:53 AM  LOS: 5 days

## 2021-09-20 NOTE — Progress Notes (Signed)
Contacted by CSW to see if pt's sister has an alternative contact number due to staff being unable to reach pt's sister. Contacted [facility] to see if clinic has another number. Clinic had the same number as CSW.   Contacted by CSW this afternoon to say that pt's husband has accepted a bed at [facility] in [place]. Pt will need to receive HD at [facility] or [facility] on MWF between 8:00-5:00. [facility]ed to reach [facility] this afternoon but she has left for the day. Navigator will contact Kim in the am to start referral process. Update provided to CSW.   Melven Sartorius Renal Navigator [phone]

## 2021-09-20 NOTE — TOC Progression Note (Signed)
Transition of Care [facility]) - Progression Note    Patient Details  Name: [name] MRN: [id number] Date of Birth: 1980-10-12  Transition of Care [facility]) CM/SW Contact  Emeterio Reeve, [place] Phone Number: 09/20/2021, 4:42 PM  Clinical Narrative:    11:00am- CSW called pts sister [name] and husband [name]. CSW was unable to leve either a message due voicemail was full. CSW sent text message to pts sister requesting a call back.    3:00- CSW spoke to pt and spouse at bedside. Pt did not open eyes or acknowledge CSW in room.   Pts husband stated that they are separated and he has been living in [place] but came back to take care of her. CSW explained SNF recommendation. [name] is adamant that he wants to take pt home. CSW requested that [name] work with pt and PT before making a final decision. [name] also requested that he be compensated through [facility] for becoming her caregiver. CSW explained that, that is a long process to get set up and pt is ready for DC now.   Pts sister [name] called CSW back. [name] stated that pt and her husband has been separated for about a year and she has been living with her for the past couple months. [name] also has pts 11 year son. CSW explained that pts husband wants to take her home and care for her. [name] stated that is a bad idea because the home is dirty and cluttered. [name] stated that she understands he is her legal decision maker but firmly believes ts a bad idea.   4:00pm- Pts husband [name] called CSW on phone and discussed taking her home versus SNF. [name] ultimately decided that SNF is the best option at this time. CSW called [name] and informed her of [facility] decision.   Expected Discharge Plan: [place] Barriers to Discharge: Continued Medical Work up, SNF Pending bed offer  Expected Discharge Plan and Services Expected Discharge Plan: [place] In-house Referral: Clinical Social Work   Post Acute Care Choice: [place] Living arrangements for the past 2 months: Apartment                                       Social Determinants of Health (SDOH) Interventions    Readmission Risk Interventions Readmission Risk Prevention Plan 08/23/2021  Transportation Screening Complete  HRI or [place] Complete  Social Work Consult for [place] Planning/Counseling Complete  Palliative Care Screening Not Applicable  Medication Review [employer]) Complete  Some recent data might be hidden   Emeterio Reeve, [employer] Clinical Social Worker

## 2021-09-20 NOTE — Progress Notes (Signed)
PROGRESS NOTE  Berta Denson HQP:[id number] DOB: Jul 28, 1981 DOA: 09/15/2021 PCP: [facility]  HPI/Recap of past 24 hours: [name] is a 41 y.o. female with medical history significant for DMT2, ESRD on HD TTS, polyneuropathy, HTN, noncompliance, who was sent to the hospital after completing dialysis session due to concern for altered mental status.  Work-up revealed right lower extremity osteomyelitis seen on MRI status post right below the knee amputation on 09/16/2021.  Plan for SNF placement.  09/20/21:  Seen and examined at her bedside.  She is somnolent but arouses to voices.  Does not participate in the exam.    Assessment/Plan: Principal Problem:   Osteomyelitis ([place]) Active Problems:   Anemia of chronic disease   Status post amputation of toe of right foot (HCC)   Hyperglycemia due to type 2 diabetes mellitus ([place])   ESRD on hemodialysis ([place])  Resolved acute metabolic encephalopathy-POA Continue to reorient as needed.   2.  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 Trula Slade  Abx discontinued since BKA Monitor fever curve and WBC.  3. ESRD HD TTS  2/5 underwent dialysis to make up missed treatment on Thursday. Management per nephrology HD on 09/20/2021.  4. Anemia of chronic disease in the setting of ESRD.  Hemoglobin 8.4 from 8.5. Noted bleeding.    5. Diabetes mellitus type 2 with hyperglycemia POA Hemoglobin A1c 10.4 on 08/11/2021  Continue Semglee  Continue R-ISS   6. Hypoalbuminemia POA Albumin 1.7  nutrition consulted   7.  Resolved AG metabolic acidosis POA Likely from infection and missed HD  Physical debility Per vascular surgery disposition will be to SNF.         DVT prophylaxis: Heparin SQ heparin Code Status: Full Family Communication: None at bedside Disposition Plan:  Status is:  Inpatient Remains inpatient appropriate because: SNF pending.  Monitor labs             Objective: Vitals:   09/19/21 2015 09/19/21 2049 09/20/21 0524 09/20/21 0756  BP: (!) 99/23 (!) 88/46 (!) 85/58 99/68  Pulse: 79  76 80  Resp: 17  17 15   Temp: 98.4 F (36.9 C)  97.9 F (36.6 C) (!) 97.4 F (36.3 C)  TempSrc: Oral  Oral Oral  SpO2:   99% 100%  Weight:      Height:        Intake/Output Summary (Last 24 hours) at 09/20/2021 1106 Last data filed at 09/20/2021 0800 Gross per 24 hour  Intake 470 ml  Output 0 ml  Net 470 ml   Filed Weights   09/17/21 1604 09/19/21 0748 09/19/21 1058  Weight: 54 kg 58.2 kg 57.3 kg    Exam:  General: 41 y.o. year-old female well developed well nourished in no acute distress.  Somnolent but arouses  to voices. Cardiovascular: Regular rate and rhythm with no rubs or gallops.  No thyromegaly or JVD noted.   Respiratory: Clear to auscultation with no wheezes or rales. Good inspiratory effort. Abdomen: Soft nontender nondistended with normal bowel sounds x4 quadrants. Musculoskeletal: R AKA Skin: No ulcerative lesions noted or rashes, Psychiatry: Mood is appropriate for condition and setting   Data Reviewed: CBC: Recent Labs  Lab 09/15/21 1547 09/16/21 0941 09/17/21 0126 09/18/21 0136 09/19/21 0332  WBC 17.9* 19.9* 21.0* 15.0* 16.5*  NEUTROABS 16.2*  --   --   --   --  HGB 9.5* 8.7* 8.4* 8.5* 8.4*  HCT 31.0* 27.7* 28.8* 28.7* 28.7*  MCV 88.8 87.4 91.7 90.5 91.1  PLT 260 300 270 322 637   Basic Metabolic Panel: Recent Labs  Lab 09/15/21 1547 09/17/21 0126 09/18/21 0136 09/18/21 0711 09/19/21 0332 09/19/21 1347 09/19/21 1904 09/20/21 0342 09/20/21 0830  NA 137 133* 127*  --  134*  --   --   --   --   K 4.8 5.1 6.8*   < > 4.6 3.6 >7.5* 4.4 4.0  CL 92* 93* 91*  --  96*  --   --   --   --   CO2 21* 22 22  --  22  --   --   --   --   GLUCOSE 130* 273* 451*  --  216*  --   --   --   --   BUN 26* 30* 54*  --  72*  --   --    --   --   CREATININE 5.39* 4.85* 6.15*  --  7.87*  --   --   --   --   CALCIUM 8.6* 8.6* 7.4*  --  7.7*  --   --   --   --   PHOS  --   --  7.4*  --   --   --   --   --   --    < > = values in this interval not displayed.   GFR: Estimated Creatinine Clearance: 7.2 mL/min (A) (by C-G formula based on SCr of 7.87 mg/dL (H)). Liver Function Tests: Recent Labs  Lab 09/15/21 1547  AST 38  ALT <5  ALKPHOS 141*  BILITOT 1.0  PROT 8.0  ALBUMIN 1.7*   No results for input(s): LIPASE, AMYLASE in the last 168 hours. No results for input(s): AMMONIA in the last 168 hours. Coagulation Profile: No results for input(s): INR, PROTIME in the last 168 hours. Cardiac Enzymes: No results for input(s): CKTOTAL, CKMB, CKMBINDEX, TROPONINI in the last 168 hours. BNP (last 3 results) No results for input(s): PROBNP in the last 8760 hours. HbA1C: No results for input(s): HGBA1C in the last 72 hours. CBG: Recent Labs  Lab 09/19/21 1638 09/19/21 2020 09/19/21 2319 09/20/21 0321 09/20/21 0754  GLUCAP 188* 255* 312* 248* 146*   Lipid Profile: No results for input(s): CHOL, HDL, LDLCALC, TRIG, CHOLHDL, LDLDIRECT in the last 72 hours. Thyroid Function Tests: No results for input(s): TSH, T4TOTAL, FREET4, T3FREE, THYROIDAB in the last 72 hours. Anemia Panel: No results for input(s): VITAMINB12, FOLATE, FERRITIN, TIBC, IRON, RETICCTPCT in the last 72 hours. Urine analysis:    Component Value Date/Time   COLORURINE YELLOW 02/21/2019 1955   APPEARANCEUR TURBID (A) 02/21/2019 1955   LABSPEC 1.012 02/21/2019 1955   PHURINE 7.0 02/21/2019 1955   GLUCOSEU >=500 (A) 02/21/2019 1955   HGBUR MODERATE (A) 02/21/2019 1955   BILIRUBINUR NEGATIVE 02/21/2019 [place] NEGATIVE 02/21/2019 1955   PROTEINUR 100 (A) 02/21/2019 1955   NITRITE NEGATIVE 02/21/2019 1955   LEUKOCYTESUR MODERATE (A) 02/21/2019 1955   Sepsis Labs: @LABRCNTIP (procalcitonin:4,lacticidven:4)  ) Recent Results (from the  past 240 hour(s))  Resp Panel by RT-PCR (Flu A&B, Covid) Nasopharyngeal Swab     Status: None   Collection Time: 09/15/21  4:11 PM   Specimen: Nasopharyngeal Swab; Nasopharyngeal(NP) swabs in vial transport medium  Result Value Ref Range Status   SARS Coronavirus 2 by RT PCR NEGATIVE NEGATIVE Final  Comment: (NOTE) SARS-CoV-2 target nucleic acids are NOT DETECTED.  The SARS-CoV-2 RNA is generally detectable in upper respiratory specimens during the acute phase of infection. The lowest concentration of SARS-CoV-2 viral copies this assay can detect is 138 copies/mL. A negative result does not preclude SARS-Cov-2 infection and should not be used as the sole basis for treatment or other patient management decisions. A negative result may occur with  improper specimen collection/handling, submission of specimen other than nasopharyngeal swab, presence of viral mutation(s) within the areas targeted by this assay, and inadequate number of viral copies(<138 copies/mL). A negative result must be combined with clinical observations, patient history, and epidemiological information. The expected result is Negative.  Fact Sheet for Patients:  EntrepreneurPulse.com.au  Fact Sheet for Healthcare Providers:  IncredibleEmployment.be  This test is no t yet approved or cleared by the [place] FDA and  has been authorized for detection and/or diagnosis of SARS-CoV-2 by FDA under an Emergency Use Authorization (EUA). This EUA will remain  in effect (meaning this test can be used) for the duration of the COVID-19 declaration under Section 564(b)(1) of the Act, 21 U.S.C.section 360bbb-3(b)(1), unless the authorization is terminated  or revoked sooner.       Influenza A by PCR NEGATIVE NEGATIVE Final   Influenza B by PCR NEGATIVE NEGATIVE Final    Comment: (NOTE) The Xpert Xpress SARS-CoV-2/FLU/RSV plus assay is intended as an aid in the diagnosis of  influenza from Nasopharyngeal swab specimens and should not be used as a sole basis for treatment. Nasal washings and aspirates are unacceptable for Xpert Xpress SARS-CoV-2/FLU/RSV testing.  Fact Sheet for Patients: EntrepreneurPulse.com.au  Fact Sheet for Healthcare Providers: IncredibleEmployment.be  This test is not yet approved or cleared by the [place] FDA and has been authorized for detection and/or diagnosis of SARS-CoV-2 by FDA under an Emergency Use Authorization (EUA). This EUA will remain in effect (meaning this test can be used) for the duration of the COVID-19 declaration under Section 564(b)(1) of the Act, 21 U.S.C. section 360bbb-3(b)(1), unless the authorization is terminated or revoked.  Performed at [facility], [place] [address] [place], [place] [address]   Culture, blood (routine x 2)     Status: None (Preliminary result)   Collection Time: 09/16/21  9:40 AM   Specimen: BLOOD  Result Value Ref Range Status   Specimen Description BLOOD RIGHT ANTECUBITAL  Final   Special Requests   Final    BOTTLES DRAWN AEROBIC AND ANAEROBIC Blood Culture adequate volume   Culture   Final    NO GROWTH 4 DAYS Performed at [facility], [place] [address], [place], [place] [address]    Report Status PENDING  Incomplete  Culture, blood (routine x 2)     Status: None (Preliminary result)   Collection Time: 09/16/21  9:42 AM   Specimen: BLOOD LEFT HAND  Result Value Ref Range Status   Specimen Description BLOOD LEFT HAND  Final   Special Requests   Final    BOTTLES DRAWN AEROBIC AND ANAEROBIC Blood Culture results may not be optimal due to an inadequate volume of blood received in culture bottles   Culture   Final    NO GROWTH 4 DAYS Performed at [facility], [place] [address], [place], [place] [address]    Report Status PENDING  Incomplete  Surgical pcr screen     Status: None   Collection Time: 09/17/21  6:16 AM    Specimen: Nasal Mucosa; Nasal  Swab  Result Value Ref Range Status   MRSA, PCR NEGATIVE NEGATIVE Final   Staphylococcus aureus NEGATIVE NEGATIVE Final    Comment: (NOTE) The Xpert SA Assay (FDA approved for NASAL specimens in patients 44 years of age and older), is one component of a comprehensive surveillance program. It is not intended to diagnose infection nor to guide or monitor treatment. Performed at [facility], [place] [address], [place], [place] [address]       Studies: No results found.  Scheduled Meds:  (feeding supplement) PROSource Plus  30 mL Oral BID BM   calcitRIOL  0.25 mcg Oral BID   Chlorhexidine Gluconate Cloth  6 each Topical Q0600   Chlorhexidine Gluconate Cloth  6 each Topical Q0600   cinacalcet  90 mg Oral Q supper   docusate sodium  100 mg Oral Daily   dorzolamide-timolol  1 drop Left Eye BID   feeding supplement (NEPRO CARB STEADY)  237 mL Oral BID BM   gabapentin  100 mg Oral TID   heparin injection (subcutaneous)  5,000 Units Subcutaneous Q8H   insulin aspart  0-6 Units Subcutaneous Q4H   insulin glargine-yfgn  10 Units Subcutaneous Daily   lidocaine-prilocaine  1 application Topical UD   multivitamin  1 tablet Oral Daily   naproxen  250 mg Oral BID WC   pantoprazole  40 mg Oral Daily   sevelamer carbonate  1.6 g Oral TID WC   traMADol  100 mg Oral Q12H    Continuous Infusions:  sodium chloride     sodium chloride     magnesium sulfate bolus IVPB       LOS: 5 days     Kayleen Memos, MD [facility] Pager [phone]  If 7PM-7AM, please contact night-coverage [email or web address] Password [email or web address] 09/20/2021, 11:06 AM

## 2021-09-20 NOTE — Progress Notes (Signed)
Physical Therapy Treatment Patient Details Name: [name] MRN: [id number] DOB: April 22, 1981 Today's Date: 09/20/2021   History of Present Illness [name] is a 41 y.o. female who was sent after completing dialysis session 09/15/21 due to staff concern for altered mental status.  She was not speaking to them. 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    PT Comments    Pt received in supine with spouse present, pt with complaints of buttocks discomfort at arrival but no other communication and pt generally very lethargic. +2 total assist to bring pt sitting EOB, once EOB pt unable to maintain sitting balance with total assist to keep pt upright. Deferred transfer training this session for safety. Pt returned to supine with total assist +2 and repositioned. NT present at end of session for peri-care. Pt continues to benefit from skilled PT services to progress toward functional mobility goals.    Recommendations for follow up therapy are one component of a multi-disciplinary discharge planning process, led by the attending physician.  Recommendations may be updated based on patient status, additional functional criteria and insurance authorization.  Follow Up Recommendations  Skilled nursing-short term rehab (<3 hours/day)     Assistance Recommended at Discharge Frequent or constant Supervision/Assistance  Patient can return home with the following Two people to help with walking and/or transfers;Two people to help with bathing/dressing/bathroom   Equipment Recommendations  Other (comment) (TBD by next venue of care)    Recommendations for Other Services       Precautions / Restrictions Precautions Precautions: Fall Restrictions Weight Bearing Restrictions:  Yes RLE Weight Bearing: Non weight bearing     Mobility  Bed Mobility Overal bed mobility: Needs Assistance Bed Mobility: Rolling, Supine to Sit, Sit to Supine Rolling: Total assist, +2 for physical assistance (to left) Sidelying to sit: Total assist, +2 for physical assistance Supine to sit: Total assist, +2 for physical assistance Sit to supine: Total assist, +2 for physical assistance   General bed mobility comments: total assist +2 for all bed mobility, pt unable to contribute and unable to follow cues or commands    Transfers                   General transfer comment: unable to attempt as pt unable to maintain any balance at EOB or increase alertness, unsafe to proceed with transfers    Ambulation/Gait               General Gait Details: Unable   Stairs             Wheelchair Mobility    Modified Rankin (Stroke Patients Only)       Balance Overall balance assessment: Needs assistance Sitting-balance support: Bilateral upper extremity supported (LLE on floor, total assist to maintain sitting balance) Sitting balance-Leahy Scale: Zero Sitting balance - Comments: Total assist to maintain sitting at EOB                                    Cognition Arousal/Alertness: Lethargic Behavior During Therapy: Flat affect Overall Cognitive Status: Difficult to assess Area of Impairment: Orientation, Attention, Following commands, Safety/judgement, Awareness, Problem solving  Orientation Level: Disoriented to, Time, Place, Situation (unable to answer simple questions, could/would not open eyes kwpt repeating " i cant hear you") Current Attention Level: Focused Memory: Decreased recall of precautions, Decreased short-term memory Following Commands: Follows one step commands inconsistently, Follows one step commands with increased time Safety/Judgement: Decreased awareness of safety, Decreased awareness of  deficits Awareness: Intellectual Problem Solving: Slow processing, Decreased initiation, Difficulty sequencing, Requires verbal cues, Requires tactile cues          Exercises      General Comments        Pertinent Vitals/Pain Pain Assessment Faces Pain Scale: No hurt    Home Living                          Prior Function            PT Goals (current goals can now be found in the care plan section) Acute Rehab PT Goals Patient Stated Goal: to go home PT Goal Formulation: With patient Time For Goal Achievement: 10/02/21 Potential to Achieve Goals: Fair    Frequency    Min 3X/week      PT Plan Current plan remains appropriate    Co-evaluation PT/OT/SLP Co-Evaluation/Treatment: Yes            AM-PAC PT "6 Clicks" Mobility   Outcome Measure  Help needed turning from your back to your side while in a flat bed without using bedrails?: A Lot Help needed moving from lying on your back to sitting on the side of a flat bed without using bedrails?: Total Help needed moving to and from a bed to a chair (including a wheelchair)?: Total Help needed standing up from a chair using your arms (e.g., wheelchair or bedside chair)?: Total Help needed to walk in hospital room?: Total Help needed climbing 3-5 steps with a railing? : Total 6 Click Score: 7    End of Session   Activity Tolerance: Patient limited by lethargy Patient left: in bed;with call bell/phone within reach;with nursing/sitter in room;with family/visitor present Nurse Communication: Mobility status;Other (comment) (inability to maintain alerrtness) PT Visit Diagnosis: Other abnormalities of gait and mobility (R26.89);Muscle weakness (generalized) (M62.81);Difficulty in walking, not elsewhere classified (R26.2) Pain - Right/Left: Right Pain - part of body: Ankle and joints of foot     Time: 1500-1519 PT Time Calculation (min) (ACUTE ONLY): 19 min  Charges:  $Therapeutic Activity: 8-22  mins                    Rashika Bettes R. PTA Acute Rehabilitation Services Office: [place] 09/20/2021, 4:52 PM

## 2021-09-20 NOTE — Evaluation (Deleted)
Physical Therapy Evaluation Patient Details Name: [name] MRN: [id number] DOB: 07-14-1981 Today's Date: 09/20/2021  History of Present Illness  Virgin Zellers is a 41 y.o. female who was sent after completing dialysis session 09/15/21 due to staff concern for altered mental status.  She was not speaking to them. 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   Clinical Impression   Pt received in supine with spouse present, pt with complaints of buttocks discomfort at arrival but no other communication and pt generally very lethargic. +2 total assist to bring pt sitting EOB, once EOB pt unable to maintain sitting balance with total assist to keep pt upright. Deferred transfer training this session for safety. Pt returned to supine with total assist +2 and repositioned. NT present at end of session for peri-care. Pt continues to benefit from skilled PT services to progress toward functional mobility goals.       Recommendations for follow up therapy are one component of a multi-disciplinary discharge planning process, led by the attending physician.  Recommendations may be updated based on patient status, additional functional criteria and insurance authorization.  Follow Up Recommendations Skilled nursing-short term rehab (<3 hours/day)    Assistance Recommended at Discharge Frequent or constant Supervision/Assistance  Patient can return home with the following  Two people to help with walking and/or transfers;Two people to help with bathing/dressing/bathroom    Equipment Recommendations Other (comment) (TBD by next venue of care)  Recommendations for Other Services       Functional Status Assessment Patient has had a recent decline in their functional status and  demonstrates the ability to make significant improvements in function in a reasonable and predictable amount of time.     Precautions / Restrictions Precautions Precautions: Fall Restrictions Weight Bearing Restrictions: Yes RLE Weight Bearing: Non weight bearing      Mobility  Bed Mobility Overal bed mobility: Needs Assistance Bed Mobility: Rolling, Supine to Sit, Sit to Supine Rolling: Total assist, +2 for physical assistance (to left) Sidelying to sit: Total assist, +2 for physical assistance Supine to sit: Total assist, +2 for physical assistance Sit to supine: Total assist, +2 for physical assistance   General bed mobility comments: total assist +2 for all bed mobility, pt unable to contribute and unable to follow cues or commands    Transfers     General transfer comment: unable to attempt as pt unable to maintain any balance at EOB or increase alertness, unsafe to proceed with transfers    Ambulation/Gait       General Gait Details: Unable  Stairs            Wheelchair Mobility    Modified Rankin (Stroke Patients Only)       Balance Overall balance assessment: Needs assistance Sitting-balance support: Bilateral upper extremity supported (LLE on floor, total assist to maintain sitting balance) Sitting balance-Leahy Scale: Zero Sitting balance - Comments: Total assist to maintain sitting at EOB           Pertinent Vitals/Pain Pain Assessment Faces Pain Scale: No hurt    Home Living                          Prior Function  Hand Dominance        Extremity/Trunk Assessment                Communication      Cognition Arousal/Alertness: Lethargic Behavior During Therapy: Flat affect Overall Cognitive Status: Difficult to assess Area of Impairment: Orientation, Attention, Following commands, Safety/judgement, Awareness, Problem solving                 Orientation Level: Disoriented  to, Time, Place, Situation (unable to answer simple questions, could/would not open eyes kwpt repeating " i cant hear you") Current Attention Level: Focused Memory: Decreased recall of precautions, Decreased short-term memory Following Commands: Follows one step commands inconsistently, Follows one step commands with increased time Safety/Judgement: Decreased awareness of safety, Decreased awareness of deficits Awareness: Intellectual Problem Solving: Slow processing, Decreased initiation, Difficulty sequencing, Requires verbal cues, Requires tactile cues          General Comments      Exercises     Assessment/Plan    PT Assessment    PT Problem List Decreased strength;Decreased mobility;Decreased safety awareness;Decreased range of motion;Decreased activity tolerance;Decreased balance;Decreased cognition;Pain;Decreased knowledge of use of DME;Decreased knowledge of precautions       PT Treatment Interventions DME instruction;Therapeutic activities;Gait training;Therapeutic exercise;Patient/family education;Wheelchair mobility training;Functional mobility training;Balance training    PT Goals (Current goals can be found in the Care Plan section)  Acute Rehab PT Goals Patient Stated Goal: to go home PT Goal Formulation: With patient Time For Goal Achievement: 10/02/21 Potential to Achieve Goals: Fair    Frequency Min 3X/week     Co-evaluation PT/OT/SLP Co-Evaluation/Treatment: No             AM-PAC PT "6 Clicks" Mobility  Outcome Measure Help needed turning from your back to your side while in a flat bed without using bedrails?: A Lot Help needed moving from lying on your back to sitting on the side of a flat bed without using bedrails?: Total Help needed moving to and from a bed to a chair (including a wheelchair)?: Total Help needed standing up from a chair using your arms (e.g., wheelchair or bedside chair)?: Total Help needed to walk in hospital room?:  Total Help needed climbing 3-5 steps with a railing? : Total 6 Click Score: 7    End of Session   Activity Tolerance: Patient limited by lethargy Patient left: in bed;with call bell/phone within reach;with nursing/sitter in room;with family/visitor present Nurse Communication: Mobility status;Other (comment) (inability to maintain alerrtness) PT Visit Diagnosis: Other abnormalities of gait and mobility (R26.89);Muscle weakness (generalized) (M62.81);Difficulty in walking, not elsewhere classified (R26.2) Pain - Right/Left: Right Pain - part of body: Ankle and joints of foot    Time: 1500-1519 PT Time Calculation (min) (ACUTE ONLY): 19 min   Charges:    PT Treatments $Therapeutic Activity: 8-22 mins        Jamela Cumbo R. PTA Acute Rehabilitation Services Office: [place] 09/20/2021, 3:45 PM

## 2021-09-20 NOTE — Progress Notes (Addendum)
[facility]  Subjective  - Does not respond verbally, appears comfortable.  Assessment/Planning: POD # 3 Right BKA secondary to foot infection that was not salvageable.  Right BKA dressing changed today amputation appears viable. Stable post op disposition.     Objective 99/68 80 (!) 97.4 F (36.3 C) (Oral) 15 100%  Intake/Output Summary (Last 24 hours) at 09/20/2021 0832 Last data filed at 09/20/2021 0300 Gross per 24 hour  Intake 370 ml  Output 948 ml  Net -578 ml      Stump cool to touch without ischemic skin changes.  Staples intact, no erythema or edema. Stump sock and limb guard replaced right LE Lungs non labored breathing General no acute distress Disposition will be SNF  Roxy Horseman 09/20/2021 8:32 AM --  Laboratory Lab Results: Recent Labs    09/18/21 0136 09/19/21 0332  WBC 15.0* 16.5*  HGB 8.5* 8.4*  HCT 28.7* 28.7*  PLT 322 254   BMET Recent Labs    09/18/21 0136 09/18/21 0711 09/19/21 0332 09/19/21 1347 09/19/21 1904 09/20/21 0342  NA 127*  --  134*  --   --   --   K 6.8*   < > 4.6   < > >7.5* 4.4  CL 91*  --  96*  --   --   --   CO2 22  --  22  --   --   --   GLUCOSE 451*  --  216*  --   --   --   BUN 54*  --  72*  --   --   --   CREATININE 6.15*  --  7.87*  --   --   --   CALCIUM 7.4*  --  7.7*  --   --   --    < > = values in this interval not displayed.    COAG Lab Results  Component Value Date   INR 1.2 08/10/2021   INR 1.1 06/30/2020   No results found for: PTT   I agree with the above.  Dressing was changed today.  Her amputation is healing appropriately.  Continue with limb guard and compression sock.  [name]

## 2021-09-20 NOTE — Progress Notes (Signed)
° ° °  OVERNIGHT PROGRESS REPORT  Notified by RN for a critical Potassium value. As noted earlier in 25 note there was found to be a possible errant high value as appears to be the case here as original value was called at >7.5 Upon recheck , level is found to be 4.4 mmol/L.   Gershon Cull MSNA MSN ACNPC-AG Acute Care Nurse Practitioner [place]

## 2021-09-20 NOTE — Progress Notes (Addendum)
Inpatient Diabetes Program Recommendations  AACE/ADA: New Consensus Statement on Inpatient Glycemic Control (2015)  Target Ranges:  Prepandial:   less than 140 mg/dL      Peak postprandial:   less than 180 mg/dL (1-2 hours)      Critically ill patients:  140 - 180 mg/dL   Lab Results  Component Value Date   GLUCAP 163 (H) 09/20/2021   HGBA1C 10.4 (H) 08/11/2021    Review of Glycemic Control  Latest Reference Range & Units 09/19/21 23:19 09/20/21 03:21 09/20/21 07:54 09/20/21 11:49  Glucose-Capillary 70 - 99 mg/dL 312 (H) 248 (H) 146 (H) 163 (H)  (H): Data is abnormally high Diabetes history: Type 1 DM Current orders for Inpatient glycemic control: Novolog 0-6 units Q4H Semglee 10 units QD Inpatient Diabetes Program Recommendations:    Now that patient has a diet order, consider changing to Novolog 0-6 units TID & HS and adding Novolog 2 units TID (assuming patient consuming >50% of meals).   Thanks, Bronson Curb, MSN, RNC-OB Diabetes Coordinator [phone] (8a-5p)

## 2021-09-21 DIAGNOSIS — M869 Osteomyelitis, unspecified: Secondary | ICD-10-CM | POA: Diagnosis not present

## 2021-09-21 LAB — GLUCOSE, CAPILLARY
Glucose-Capillary: 126 mg/dL — ABNORMAL HIGH (ref 70–99)
Glucose-Capillary: 182 mg/dL — ABNORMAL HIGH (ref 70–99)
Glucose-Capillary: 78 mg/dL (ref 70–99)
Glucose-Capillary: 80 mg/dL (ref 70–99)
Glucose-Capillary: 90 mg/dL (ref 70–99)
Glucose-Capillary: 95 mg/dL (ref 70–99)

## 2021-09-21 LAB — CBC WITH DIFFERENTIAL/PLATELET
Abs Immature Granulocytes: 0.23 10*3/uL — ABNORMAL HIGH (ref 0.00–0.07)
Basophils Absolute: 0.1 10*3/uL (ref 0.0–0.1)
Basophils Relative: 0 %
Eosinophils Absolute: 0.2 10*3/uL (ref 0.0–0.5)
Eosinophils Relative: 2 %
HCT: 27 % — ABNORMAL LOW (ref 36.0–46.0)
Hemoglobin: 8.2 g/dL — ABNORMAL LOW (ref 12.0–15.0)
Immature Granulocytes: 2 %
Lymphocytes Relative: 9 %
Lymphs Abs: 1.3 10*3/uL (ref 0.7–4.0)
MCH: 27.1 pg (ref 26.0–34.0)
MCHC: 30.4 g/dL (ref 30.0–36.0)
MCV: 89.1 fL (ref 80.0–100.0)
Monocytes Absolute: 1 10*3/uL (ref 0.1–1.0)
Monocytes Relative: 6 %
Neutro Abs: 12.5 10*3/uL — ABNORMAL HIGH (ref 1.7–7.7)
Neutrophils Relative %: 81 %
Platelets: 245 10*3/uL (ref 150–400)
RBC: 3.03 MIL/uL — ABNORMAL LOW (ref 3.87–5.11)
RDW: 23.9 % — ABNORMAL HIGH (ref 11.5–15.5)
WBC: 15.3 10*3/uL — ABNORMAL HIGH (ref 4.0–10.5)
nRBC: 1.4 % — ABNORMAL HIGH (ref 0.0–0.2)

## 2021-09-21 LAB — POTASSIUM
Potassium: 3.9 mmol/L (ref 3.5–5.1)
Potassium: 4.1 mmol/L (ref 3.5–5.1)
Potassium: 3.5 mmol/L (ref 3.5–5.1)

## 2021-09-21 LAB — MAGNESIUM: Magnesium: 1.9 mg/dL (ref 1.7–2.4)

## 2021-09-21 LAB — BLOOD GAS, VENOUS
Acid-Base Excess: 1 mmol/L (ref 0.0–2.0)
Bicarbonate: 25.4 mmol/L (ref 20.0–28.0)
Drawn by: 164
FIO2: 21
O2 Saturation: 59 %
Patient temperature: 37
pCO2, Ven: 42.7 mmHg — ABNORMAL LOW (ref 44.0–60.0)
pH, Ven: 7.393 (ref 7.250–7.430)
pO2, Ven: 34.3 mmHg (ref 32.0–45.0)

## 2021-09-21 LAB — BLOOD GAS, ARTERIAL
Acid-Base Excess: 1.4 mmol/L (ref 0.0–2.0)
Bicarbonate: 25.2 mmol/L (ref 20.0–28.0)
Drawn by: 34762
FIO2: 21
O2 Saturation: 98.2 %
Patient temperature: 36.8
pCO2 arterial: 37.4 mmHg (ref 32.0–48.0)
pH, Arterial: 7.443 (ref 7.350–7.450)
pO2, Arterial: 98.8 mmHg (ref 83.0–108.0)

## 2021-09-21 LAB — COMPREHENSIVE METABOLIC PANEL
ALT: 14 U/L (ref 0–44)
AST: 95 U/L — ABNORMAL HIGH (ref 15–41)
Albumin: 2.2 g/dL — ABNORMAL LOW (ref 3.5–5.0)
Alkaline Phosphatase: 88 U/L (ref 38–126)
Anion gap: 16 — ABNORMAL HIGH (ref 5–15)
BUN: 28 mg/dL — ABNORMAL HIGH (ref 6–20)
CO2: 23 mmol/L (ref 22–32)
Calcium: 7.8 mg/dL — ABNORMAL LOW (ref 8.9–10.3)
Chloride: 93 mmol/L — ABNORMAL LOW (ref 98–111)
Creatinine, Ser: 3.99 mg/dL — ABNORMAL HIGH (ref 0.44–1.00)
GFR, Estimated: 14 mL/min — ABNORMAL LOW (ref >60)
Glucose, Bld: 93 mg/dL (ref 70–99)
Potassium: 3.3 mmol/L — ABNORMAL LOW (ref 3.5–5.1)
Sodium: 132 mmol/L — ABNORMAL LOW (ref 135–145)
Total Bilirubin: 1 mg/dL (ref 0.3–1.2)
Total Protein: 7.7 g/dL (ref 6.5–8.1)

## 2021-09-21 LAB — CULTURE, BLOOD (ROUTINE X 2)
Culture: NO GROWTH
Culture: NO GROWTH
Special Requests: ADEQUATE

## 2021-09-21 LAB — SARS CORONAVIRUS 2 (TAT 6-24 HRS): SARS Coronavirus 2: NEGATIVE

## 2021-09-21 LAB — PHOSPHORUS: Phosphorus: 3.1 mg/dL (ref 2.5–4.6)

## 2021-09-21 LAB — TSH: TSH: 3.412 u[IU]/mL (ref 0.350–4.500)

## 2021-09-21 LAB — AMMONIA: Ammonia: 17 umol/L (ref 9–35)

## 2021-09-21 MED ORDER — MIDODRINE HCL 5 MG PO TABS
10.0000 mg | ORAL_TABLET | ORAL | Status: DC
  Administered 2021-09-21 – 2021-09-23 (×2): 10 mg via ORAL
  Filled 2021-09-21: qty 2

## 2021-09-21 MED ORDER — ALBUMIN HUMAN 25 % IV SOLN
INTRAVENOUS | Status: AC
  Filled 2021-09-21: qty 100

## 2021-09-21 MED ORDER — DARBEPOETIN ALFA 200 MCG/0.4ML IJ SOSY
PREFILLED_SYRINGE | INTRAMUSCULAR | Status: AC
  Administered 2021-09-21: 200 ug
  Filled 2021-09-21: qty 0.4

## 2021-09-21 MED ORDER — ALBUMIN HUMAN 25 % IV SOLN: 12.5000 g | Freq: Once | INTRAVENOUS | Status: DC

## 2021-09-21 MED ORDER — INSULIN ASPART 100 UNIT/ML IJ SOLN: 0.0000 [IU] | Freq: Three times a day (TID) | INTRAMUSCULAR | Status: DC

## 2021-09-21 MED ORDER — MIDODRINE HCL 5 MG PO TABS
ORAL_TABLET | ORAL | Status: AC
  Filled 2021-09-21: qty 2

## 2021-09-21 MED ORDER — INSULIN ASPART 100 UNIT/ML IJ SOLN: 0.0000 [IU] | Freq: Every day | INTRAMUSCULAR | Status: DC

## 2021-09-21 MED ORDER — DARBEPOETIN ALFA 200 MCG/0.4ML IJ SOSY
200.0000 ug | PREFILLED_SYRINGE | INTRAMUSCULAR | Status: DC
  Administered 2021-09-28 – 2021-10-12 (×3): 200 ug via INTRAVENOUS
  Filled 2021-09-21 (×7): qty 0.4

## 2021-09-21 MED ORDER — ALBUMIN HUMAN 25 % IV SOLN
25.0000 g | Freq: Once | INTRAVENOUS | Status: AC
  Administered 2021-09-21: 25 g via INTRAVENOUS

## 2021-09-21 MED ORDER — DARBEPOETIN ALFA 100 MCG/0.5ML IJ SOSY
100.0000 ug | PREFILLED_SYRINGE | INTRAMUSCULAR | Status: DC
Start: 2021-09-21 — End: 2021-09-21

## 2021-09-21 NOTE — Significant Event (Addendum)
Rapid Response Event Note   Reason for Call :  Patient is lethargic s/p HD treatment today   Initial Focused Assessment:  On arrival, patient is minimally arousable- will only arouse to sternal stimulation at first but then somewhat responsive to voice after. She is unable to open her eyes or move her extremities to command. Pupils bilaterally are 4, round and sluggish. She has not received any recent pain medication. Husband at bedside says sometimes she is a little sleepy after HD but never this somnolent  VS: T 97.5 O, BP 103/80(90), HR 72, RR 15, Sats 100% on RA.   Interventions:  CBG: 90 ABG 7.44/37.4/98.8/25.2/98%  Plan of Care:  Per Dr. Kennon Holter- q4 neuro checks As long as patient is not in distress and VSS, no need for narcan at this time as she has not received recent dose.   Event Summary:  Please notify RRT if further assistance is needed  MD Notified: Dr. Kennon Holter Call Time: 2032 Arrival Time: 2040 End Time: 2213  Tracey Fam, RN

## 2021-09-21 NOTE — Progress Notes (Signed)
Occupational Therapy Treatment Patient Details Name: [name] MRN: [id number] DOB: 1981/06/18 Today's Date: 09/21/2021   History of present illness [name] is a 41 y.o. female who was sent after completing dialysis session 09/15/21 due to staff concern for altered mental status.  She was not speaking to them. 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   OT comments  Pt with minimal responsiveness despite bright lighting, movement and being positioned upright in bed. Husband at bedside, educated to not feed pt when she is lethargic. Husband asking about tube feeding. Pt with bowel incontinence, provided pericare and changed linen. Elevated R UE to decrease edema and asked RN to monitor arm bands. Pt with BP of 86/60 when upright in bed, decreased HOB to 40 degrees.    Recommendations for follow up therapy are one component of a multi-disciplinary discharge planning process, led by the attending physician.  Recommendations may be updated based on patient status, additional functional criteria and insurance authorization.    Follow Up Recommendations  Skilled nursing-short term rehab (<3 hours/day)    Assistance Recommended at Discharge Frequent or constant Supervision/Assistance  Patient can return home with the following  Two people to help with bathing/dressing/bathroom;Two people to help with walking and/or transfers;Assistance with cooking/housework;Assistance with feeding;Help with stairs or ramp for entrance;Assist for transportation;Direct supervision/assist for financial management;Direct supervision/assist for medications management   Equipment Recommendations  Other (comment) (defer to next venue)    Recommendations for Other Services      Precautions /  Restrictions Precautions Precautions: Fall Precaution Comments: limb protector RLE Restrictions Weight Bearing Restrictions: Yes RLE Weight Bearing: Non weight bearing       Mobility Bed Mobility Overal bed mobility: Needs Assistance Bed Mobility: Rolling Rolling: Total assist, +2 for physical assistance         General bed mobility comments: totalA for rolling, pt with no assist or self-protection of limbs noted during turning for cleaning. HOB elevated to 55 deg with no change in pt arousal, deferred further mobility    Transfers                         Balance                                           ADL either performed or assessed with clinical judgement   ADL                               Toileting- Clothing Manipulation and Hygiene: Total assistance;Bed level              Extremity/Trunk Assessment Upper Extremity Assessment RUE Deficits / Details: elevated and informed RN that arm bands need monitoring            Vision       Perception     Praxis      Cognition Arousal/Alertness: Lethargic Behavior During Therapy: Flat affect Overall Cognitive Status: Difficult to assess  General Comments: pt not following any commands or opening eyes during session, pt did seem to chew 2-3 times with max cues which she was not doing upon our arrival, but otherwise showed no command following or response to cues        Exercises      Shoulder Instructions       General Comments BP from 110s/60 to 86/60 (69) after sitting up and attempting to suction food out of the pt's mouth. returned to 40deg HOB elevated.    Pertinent Vitals/ Pain       Pain Assessment Pain Assessment: Faces Faces Pain Scale: No hurt Pain Location: moans with noxious stimuli inconsistently Pain Intervention(s): Monitored during session  Home Living                                           Prior Functioning/Environment              Frequency  Min 2X/week        Progress Toward Goals  OT Goals(current goals can now be found in the care plan section)  Progress towards OT goals: Not progressing toward goals - comment  Acute Rehab OT Goals OT Goal Formulation: Patient unable to participate in goal setting Time For Goal Achievement: 10/02/21 Potential to Achieve Goals: [place] Discharge plan remains appropriate    Co-evaluation    PT/OT/SLP Co-Evaluation/Treatment: Yes Reason for Co-Treatment: For patient/therapist safety;Necessary to address cognition/behavior during functional activity PT goals addressed during session: Mobility/safety with mobility;Balance OT goals addressed during session: ADL's and self-care      AM-PAC OT "6 Clicks" Daily Activity     Outcome Measure   Help from another person eating meals?: Total Help from another person taking care of personal grooming?: Total Help from another person toileting, which includes using toliet, bedpan, or urinal?: Total Help from another person bathing (including washing, rinsing, drying)?: Total Help from another person to put on and taking off regular upper body clothing?: Total Help from another person to put on and taking off regular lower body clothing?: Total 6 Click Score: 6    End of Session    OT Visit Diagnosis: Other abnormalities of gait and mobility (R26.89);Unsteadiness on feet (R26.81);Muscle weakness (generalized) (M62.81);Other symptoms and signs involving cognitive function;Low vision, both eyes (H54.2)   Activity Tolerance Patient limited by lethargy   Patient Left in bed;with call bell/phone within reach;with bed alarm set   Nurse Communication Other (comment) (aware of low BP and arm bands being needing monitoring)        Time: 5573-2202 OT Time Calculation (min): 27 min  Charges: OT General Charges $OT Visit: 1 Visit OT Treatments $Therapeutic  Activity: 8-22 mins  Nestor Lewandowsky, OTR/L Acute Rehabilitation Services Pager: [phone] Office: [phone]  [name] 09/21/2021, 3:44 PM

## 2021-09-21 NOTE — TOC Progression Note (Signed)
Transition of Care [facility]) - Progression Note    Patient Details  Name: [name] MRN: [id number] Date of Birth: 28-Dec-1980  Transition of Care [facility] CM/SW [place], [place] Phone Number: 09/21/2021, 2:03 PM  Clinical Narrative:    CSW was notified that DC would be held another day. Facility states they can accept tomorrow. TOC will continue to follow for DC needs.   Expected Discharge Plan: Skilled Nursing Facility Barriers to Discharge: Continued Medical Work up, SNF Pending bed offer  Expected Discharge Plan and Services Expected Discharge Plan: [place] In-house Referral: Clinical Social Work   Post Acute Care Choice: [place] Living arrangements for the past 2 months: Apartment                                       Social Determinants of Health (SDOH) Interventions    Readmission Risk Interventions Readmission Risk Prevention Plan 08/23/2021  Transportation Screening Complete  HRI or Home Care Consult Complete  Social Work Consult for [place] Planning/Counseling Complete  Palliative Care Screening Not Applicable  Medication Review Press photographer) Complete  Some recent data might be hidden

## 2021-09-21 NOTE — Progress Notes (Addendum)
[place] and spoke to Maudie Mercury this am regarding pt's out-pt HD needs at dc. Per CSW, pt is supposed to d/c to snf in [place] and needs to be clipped to [facility] vs [facility]. Kim advised of referral and documents faxed this am. Maudie Mercury aware that snf needs a MWF appt between 8:00-5:00. Will await a return call from [facility] with clinic/schedule.   Melven Sartorius Renal Navigator [phone]  Addendum at 1:15 pm: Marikay Alar with [facility] to confirm that faxes from this morning were received. Will await a return call.   Addendum at 2:40 pm: Spoke to [employer] at [employer] who confirmed receiving documents from this morning. Kim advised that clinic does not have a TB test. Maudie Mercury agreeable to accept a chest x-ray from Dec 2022. Chest x-ray faxed to Kim this afternoon. Kim also advised clinic is working to get paperwork together and faxed to her this afternoon. Kim advised pt stable for d/c once clinic has accepted pt. Update provided to CSW that clinic in [facility] is still pending.

## 2021-09-21 NOTE — Progress Notes (Signed)
Physical Therapy Treatment Patient Details Name: [name] MRN: [id number] DOB: [birth year] Today's Date: 09/21/2021   History of Present Illness [name] is a 41 y.o. female who was sent after completing dialysis session 09/15/21 due to staff concern for altered mental status.  She was not speaking to them. 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    PT Comments    This afternoon's session was limited by persistent lethargy. Pt unable to be aroused or increase alertness despite changes in position and increased multimodal stimuli. Upon our arrival, the pt's spouse had been attempting to feed the pt as he is concerned about her not eating as much, RN called as pt not swallowing and we attempted to remove all undigested food particles from the pt's mouth. She did attempt to chew 2-3 times with max cues, but this activity was not sustained and the pt showed no other response to cues, stimulation, or changes in position with remainder of session. After ~10 min of HOB elevated to 50 deg, the pt's BP was 86/60 (69) so the pt was reclined to 40 deg upon end of session. RN alerted and husband advised not to attempt to feed the pt while she is so lethargic. Will continue to attempt to progress mobility as safe and appropriate.     Recommendations for follow up therapy are one component of a multi-disciplinary discharge planning process, led by the attending physician.  Recommendations may be updated based on patient status, additional functional criteria and insurance authorization.  Follow Up Recommendations  Skilled nursing-short term rehab (<3 hours/day)     Assistance Recommended at Discharge Frequent or constant Supervision/Assistance  Patient can return home with the  following Two people to help with walking and/or transfers;Two people to help with bathing/dressing/bathroom   Equipment Recommendations  Other (comment) (defer to post acute)    Recommendations for Other Services       Precautions / Restrictions Precautions Precautions: Fall Precaution Comments: limb protector RLE Restrictions Weight Bearing Restrictions: Yes RLE Weight Bearing: Non weight bearing     Mobility  Bed Mobility Overal bed mobility: Needs Assistance Bed Mobility: Rolling Rolling: Total assist, +2 for physical assistance         General bed mobility comments: totalA for rolling, pt with no assist or self-protection of limbs noted during turning for cleaning. HOB elevated to 55 deg with no change in pt arousal, deferred further mobility    Transfers                            Balance Overall balance assessment: Needs assistance (unsafe to attempt sitting unsupported at this time, pt would require complete support)                                          Cognition Arousal/Alertness: Lethargic Behavior During Therapy: Flat affect Overall Cognitive Status: Difficult to assess                                 General Comments: pt not following any commands or opening eyes during session,  pt did seem to chew 2-3 times with max cues which she was not doing upon our arrival, but otherwise showed no command following or response to cues        Exercises      General Comments General comments (skin integrity, edema, etc.): BP from 110s/60 to 86/60 (69) after sitting up and attempting to suction food out of the pt's mouth. returned to 40deg HOB elevated.      Pertinent Vitals/Pain Pain Assessment Pain Assessment: Faces Faces Pain Scale: No hurt Pain Location: pt with no response to noxious stimuli Pain Intervention(s): Monitored during session     PT Goals (current goals can now be found in the care plan  section) Acute Rehab PT Goals PT Goal Formulation: With patient Time For Goal Achievement: 10/02/21 Potential to Achieve Goals: Fair Progress towards PT goals: Not progressing toward goals - comment (lethargy)    Frequency    Min 3X/week      PT Plan Current plan remains appropriate    Co-evaluation PT/OT/SLP Co-Evaluation/Treatment: Yes Reason for Co-Treatment: Necessary to address cognition/behavior during functional activity;For patient/therapist safety;To address functional/ADL transfers PT goals addressed during session: Mobility/safety with mobility;Balance        AM-PAC PT "6 Clicks" Mobility   Outcome Measure  Help needed turning from your back to your side while in a flat bed without using bedrails?: Total Help needed moving from lying on your back to sitting on the side of a flat bed without using bedrails?: Total Help needed moving to and from a bed to a chair (including a wheelchair)?: Total Help needed standing up from a chair using your arms (e.g., wheelchair or bedside chair)?: Total Help needed to walk in hospital room?: Total Help needed climbing 3-5 steps with a railing? : Total 6 Click Score: 6    End of Session   Activity Tolerance: Patient limited by lethargy Patient left: in bed;with call bell/phone within reach;with family/visitor present Nurse Communication: Mobility status (soft BP) PT Visit Diagnosis: Other abnormalities of gait and mobility (R26.89);Muscle weakness (generalized) (M62.81);Difficulty in walking, not elsewhere classified (R26.2)     Time: 6270-3500 PT Time Calculation (min) (ACUTE ONLY): 27 min  Charges:  $Therapeutic Activity: 8-22 mins                     West Carbo, PT, DPT   Acute Rehabilitation Department Pager #: [phone]   Sandra Cockayne 09/21/2021, 2:55 PM

## 2021-09-21 NOTE — Progress Notes (Signed)
Pt transported via bed to dialysis at this time. No family present at bedside.

## 2021-09-21 NOTE — Progress Notes (Signed)
PROGRESS NOTE  [name] ZOX:[id number] DOB: 28-Jul-1981 DOA: 09/15/2021 PCP: [facility]  HPI/Recap of past 24 hours: [name] is a 41 y.o. female with medical history significant for DMT2, ESRD on HD TTS, polyneuropathy, HTN, noncompliance, who was sent to the hospital after completing dialysis session due to concern for altered mental status.  Work-up revealed right lower extremity osteomyelitis seen on MRI status post right below the knee amputation on 09/16/2021.  Plan for SNF placement.  Hospital course complicated by lethargy on 09/21/2021 after hemodialysis.  09/21/21: Patient was seen at bedside after hemodialysis.  Her husband was present in the room.  She is hypersomnolent after hemodialysis.  Vital signs and CBG are stable.  We will obtain labs to further assess.    Assessment/Plan: Principal Problem:   Osteomyelitis (HCC) Active Problems:   Anemia of chronic disease   Status post amputation of toe of right foot ([place])   Hyperglycemia due to type 2 diabetes mellitus ([place])   ESRD on hemodialysis ([place])  Acute metabolic encephalopathy-POA Lethargic after hemodialysis on 09/21/2021, BP and CBGs are stable. Follow labs obtained this afternoon. Closely monitor vital signs and CBGs Out of bed to chair with every shift. Obtain UDS   2.  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 Trula Slade  Abx discontinued since BKA Monitor fever curve and WBC. Follow labs.  3. ESRD HD TTS  2/5 underwent dialysis to make up missed treatment on Thursday. Management per nephrology HD on 09/20/2021.  4. Anemia of chronic disease in the setting of ESRD.  Hemoglobin 8.4 from 8.5. No overt bleeding.    5. Diabetes mellitus type 2 with hyperglycemia POA Hemoglobin A1c 10.4 on 08/11/2021  Continue Semglee  Continue R-ISS Avoid hypoglycemia.   6.  Hypoalbuminemia POA Albumin 1.7  nutrition consulted   7.  Resolved AG metabolic acidosis POA Likely from infection and missed HD  Physical debility Per vascular surgery disposition will be to SNF. Patient's husband is agreeable to SNF.         DVT prophylaxis: Heparin SQ heparin Code Status: Full Family Communication: Husband at her bedside. Disposition Plan:  Status is: Inpatient Remains inpatient appropriate because: Plan to discharge to SNF on 09/22/2021 if bed is available, insurance authorized.             Objective: Vitals:   09/21/21 1100 09/21/21 1130 09/21/21 1155 09/21/21 1234  BP: 97/68 (!) 88/66 121/74 106/64  Pulse: 77  76 78  Resp: 12 10 (!) 9 17  Temp:   (!) 96.3 F (35.7 C) 97.7 F (36.5 C)  TempSrc:   Temporal Oral  SpO2:   100% 100%  Weight:   57.1 kg   Height:        Intake/Output Summary (Last 24 hours) at 09/21/2021 1328 Last data filed at 09/21/2021 1155 Gross per 24 hour  Intake 100 ml  Output 555 ml  Net -455 ml   Filed Weights   09/19/21 1058 09/21/21 0832 09/21/21 1155  Weight: 57.3 kg 57.1 kg 57.1 kg    Exam:  General: 41 y.o. year-old female well-developed well-nourished in no acute stress.  She is hypersomnolent but arousable to voices.   Cardiovascular: Regular rate and rhythm no rubs or gallops. Respiratory: Clear to auscultation no wheezes or rales.   Abdomen: Soft nontender normal bowel sounds present.   Musculoskeletal: Right below  the knee amputation. Skin: No ulcerative lesions noted. Psychiatry: Unable to assess mood due to hypersomnolence.   Data Reviewed: CBC: Recent Labs  Lab 09/15/21 1547 09/16/21 0941 09/17/21 0126 09/18/21 0136 09/19/21 0332  WBC 17.9* 19.9* 21.0* 15.0* 16.5*  NEUTROABS 16.2*  --   --   --   --   HGB 9.5* 8.7* 8.4* 8.5* 8.4*  HCT 31.0* 27.7* 28.8* 28.7* 28.7*  MCV 88.8 87.4 91.7 90.5 91.1  PLT 260 300 270 322 101   Basic Metabolic Panel: Recent Labs  Lab 09/15/21 1547  09/17/21 0126 09/18/21 0136 09/18/21 0711 09/19/21 0332 09/19/21 1347 09/20/21 0830 09/20/21 1304 09/20/21 1612 09/21/21 0604 09/21/21 0858  NA 137 133* 127*  --  134*  --   --   --   --   --   --   K 4.8 5.1 6.8*   < > 4.6   < > 4.0 4.1 4.5 3.9 4.1  CL 92* 93* 91*  --  96*  --   --   --   --   --   --   CO2 21* 22 22  --  22  --   --   --   --   --   --   GLUCOSE 130* 273* 451*  --  216*  --   --   --   --   --   --   BUN 26* 30* 54*  --  72*  --   --   --   --   --   --   CREATININE 5.39* 4.85* 6.15*  --  7.87*  --   --   --   --   --   --   CALCIUM 8.6* 8.6* 7.4*  --  7.7*  --   --   --   --   --   --   PHOS  --   --  7.4*  --   --   --   --   --   --   --   --    < > = values in this interval not displayed.   GFR: Estimated Creatinine Clearance: 7.2 mL/min (A) (by C-G formula based on SCr of 7.87 mg/dL (H)). Liver Function Tests: Recent Labs  Lab 09/15/21 1547  AST 38  ALT <5  ALKPHOS 141*  BILITOT 1.0  PROT 8.0  ALBUMIN 1.7*   No results for input(s): LIPASE, AMYLASE in the last 168 hours. No results for input(s): AMMONIA in the last 168 hours. Coagulation Profile: No results for input(s): INR, PROTIME in the last 168 hours. Cardiac Enzymes: No results for input(s): CKTOTAL, CKMB, CKMBINDEX, TROPONINI in the last 168 hours. BNP (last 3 results) No results for input(s): PROBNP in the last 8760 hours. HbA1C: No results for input(s): HGBA1C in the last 72 hours. CBG: Recent Labs  Lab 09/20/21 2021 09/21/21 0013 09/21/21 0433 09/21/21 0759 09/21/21 1237  GLUCAP 211* 182* 126* 78 95   Lipid Profile: No results for input(s): CHOL, HDL, LDLCALC, TRIG, CHOLHDL, LDLDIRECT in the last 72 hours. Thyroid Function Tests: No results for input(s): TSH, T4TOTAL, FREET4, T3FREE, THYROIDAB in the last 72 hours. Anemia Panel: No results for input(s): VITAMINB12, FOLATE, FERRITIN, TIBC, IRON, RETICCTPCT in the last 72 hours. Urine analysis:    Component Value Date/Time    COLORURINE YELLOW 02/21/2019 1955   APPEARANCEUR TURBID (A) 02/21/2019 1955   LABSPEC 1.012 02/21/2019 1955   PHURINE 7.0 02/21/2019 1955  GLUCOSEU >=500 (A) 02/21/2019 1955   HGBUR MODERATE (A) 02/21/2019 [place] NEGATIVE 02/21/2019 [place] NEGATIVE 02/21/2019 1955   PROTEINUR 100 (A) 02/21/2019 1955   NITRITE NEGATIVE 02/21/2019 1955   LEUKOCYTESUR MODERATE (A) 02/21/2019 1955   Sepsis Labs: @LABRCNTIP (procalcitonin:4,lacticidven:4)  ) Recent Results (from the past 240 hour(s))  Resp Panel by RT-PCR (Flu A&B, Covid) Nasopharyngeal Swab     Status: None   Collection Time: 09/15/21  4:11 PM   Specimen: Nasopharyngeal Swab; Nasopharyngeal(NP) swabs in vial transport medium  Result Value Ref Range Status   SARS Coronavirus 2 by RT PCR NEGATIVE NEGATIVE Final    Comment: (NOTE) SARS-CoV-2 target nucleic acids are NOT DETECTED.  The SARS-CoV-2 RNA is generally detectable in upper respiratory specimens during the acute phase of infection. The lowest concentration of SARS-CoV-2 viral copies this assay can detect is 138 copies/mL. A negative result does not preclude SARS-Cov-2 infection and should not be used as the sole basis for treatment or other patient management decisions. A negative result may occur with  improper specimen collection/handling, submission of specimen other than nasopharyngeal swab, presence of viral mutation(s) within the areas targeted by this assay, and inadequate number of viral copies(<138 copies/mL). A negative result must be combined with clinical observations, patient history, and epidemiological information. The expected result is Negative.  Fact Sheet for Patients:  EntrepreneurPulse.com.au  Fact Sheet for Healthcare Providers:  IncredibleEmployment.be  This test is no t yet approved or cleared by the [place] FDA and  has been authorized for detection and/or diagnosis of SARS-CoV-2  by FDA under an Emergency Use Authorization (EUA). This EUA will remain  in effect (meaning this test can be used) for the duration of the COVID-19 declaration under Section 564(b)(1) of the Act, 21 U.S.C.section 360bbb-3(b)(1), unless the authorization is terminated  or revoked sooner.       Influenza A by PCR NEGATIVE NEGATIVE Final   Influenza B by PCR NEGATIVE NEGATIVE Final    Comment: (NOTE) The Xpert Xpress SARS-CoV-2/FLU/RSV plus assay is intended as an aid in the diagnosis of influenza from Nasopharyngeal swab specimens and should not be used as a sole basis for treatment. Nasal washings and aspirates are unacceptable for Xpert Xpress SARS-CoV-2/FLU/RSV testing.  Fact Sheet for Patients: EntrepreneurPulse.com.au  Fact Sheet for Healthcare Providers: IncredibleEmployment.be  This test is not yet approved or cleared by the [place] FDA and has been authorized for detection and/or diagnosis of SARS-CoV-2 by FDA under an Emergency Use Authorization (EUA). This EUA will remain in effect (meaning this test can be used) for the duration of the COVID-19 declaration under Section 564(b)(1) of the Act, 21 U.S.C. section 360bbb-3(b)(1), unless the authorization is terminated or revoked.  Performed at [facility], [place] [address], [place], [place] [address]   Culture, blood (routine x 2)     Status: None   Collection Time: 09/16/21  9:40 AM   Specimen: BLOOD  Result Value Ref Range Status   Specimen Description BLOOD RIGHT ANTECUBITAL  Final   Special Requests   Final    BOTTLES DRAWN AEROBIC AND ANAEROBIC Blood Culture adequate volume   Culture   Final    NO GROWTH 5 DAYS Performed at [facility], [place] [address] [place], [place] [address]    Report Status 09/21/2021 FINAL  Final  Culture, blood (routine x 2)     Status: None   Collection Time: 09/16/21  9:42 AM   Specimen:  BLOOD LEFT HAND  Result Value Ref Range  Status   Specimen Description BLOOD LEFT HAND  Final   Special Requests   Final    BOTTLES DRAWN AEROBIC AND ANAEROBIC Blood Culture results may not be optimal due to an inadequate volume of blood received in culture bottles   Culture   Final    NO GROWTH 5 DAYS Performed at [facility], [place] [address], [place], [place] [address]    Report Status 09/21/2021 FINAL  Final  Surgical pcr screen     Status: None   Collection Time: 09/17/21  6:16 AM   Specimen: Nasal Mucosa; Nasal Swab  Result Value Ref Range Status   MRSA, PCR NEGATIVE NEGATIVE Final   Staphylococcus aureus NEGATIVE NEGATIVE Final    Comment: (NOTE) The Xpert SA Assay (FDA approved for NASAL specimens in patients 24 years of age and older), is one component of a comprehensive surveillance program. It is not intended to diagnose infection nor to guide or monitor treatment. Performed at [facility], [place] [address] [place], [place] [address]   SARS CORONAVIRUS 2 (TAT 6-24 HRS) Nasopharyngeal Nasopharyngeal Swab     Status: None   Collection Time: 09/20/21  4:26 PM   Specimen: Nasopharyngeal Swab  Result Value Ref Range Status   SARS Coronavirus 2 NEGATIVE NEGATIVE Final    Comment: (NOTE) SARS-CoV-2 target nucleic acids are NOT DETECTED.  The SARS-CoV-2 RNA is generally detectable in upper and lower respiratory specimens during the acute phase of infection. Negative results do not preclude SARS-CoV-2 infection, do not rule out co-infections with other pathogens, and should not be used as the sole basis for treatment or other patient management decisions. Negative results must be combined with clinical observations, patient history, and epidemiological information. The expected result is Negative.  Fact Sheet for Patients: SugarRoll.be  Fact Sheet for Healthcare Providers: https://www.woods-mathews.com/  This test is not yet approved or cleared by the [place] FDA and  has been authorized for detection and/or diagnosis of SARS-CoV-2 by FDA under an Emergency Use Authorization (EUA). This EUA will remain  in effect (meaning this test can be used) for the duration of the COVID-19 declaration under Se ction 564(b)(1) of the Act, 21 U.S.C. section 360bbb-3(b)(1), unless the authorization is terminated or revoked sooner.  Performed at [facility], [place] [address] [place], [place] [address]       Studies: No results found.  Scheduled Meds:  (feeding supplement) PROSource Plus  30 mL Oral BID BM   calcitRIOL  0.25 mcg Oral BID   Chlorhexidine Gluconate Cloth  6 each Topical Q0600   Chlorhexidine Gluconate Cloth  6 each Topical Q0600   Chlorhexidine Gluconate Cloth  6 each Topical Q0600   cinacalcet  90 mg Oral Q supper   darbepoetin (ARANESP) injection - DIALYSIS  200 mcg Intravenous Q Thu-HD   docusate sodium  100 mg Oral Daily   dorzolamide-timolol  1 drop Left Eye BID   feeding supplement (NEPRO CARB STEADY)  237 mL Oral BID BM   gabapentin  100 mg Oral TID   heparin injection (subcutaneous)  5,000 Units Subcutaneous Q8H   insulin aspart  0-5 Units Subcutaneous QHS   insulin aspart  0-6 Units Subcutaneous TID WC   insulin glargine-yfgn  10 Units Subcutaneous Daily   lidocaine-prilocaine  1 application Topical UD   midodrine       midodrine  10 mg Oral Q T,Th,Sa-HD   multivitamin  1 tablet Oral Daily   naproxen  250 mg Oral BID WC   pantoprazole  40 mg Oral Daily   sevelamer carbonate  1.6 g Oral TID WC   traMADol  100 mg Oral Q12H    Continuous Infusions:  albumin human     magnesium sulfate bolus IVPB       LOS: 6 days     Kayleen Memos, MD [facility] Pager [phone]  If 7PM-7AM, please contact night-coverage [email or web address] Password [email or web address] 09/21/2021, 1:28 PM

## 2021-09-21 NOTE — Progress Notes (Addendum)
Went to check on pt and she is in HD.  Her incision yesterday looked good and healing nicely.  She will need to f/u in our office in 4 weeks for staple removal.  Will continue to check on pt periodically while she is hospitalized.    [name], [facility] 09/21/2021 8:47 AM

## 2021-09-21 NOTE — Progress Notes (Signed)
[facility] Progress Note   Subjective:   Patient seen and examined at bedside during dialysis.  Hypotensive this AM.    Objective Vitals:   09/21/21 0850 09/21/21 0900 09/21/21 0930 09/21/21 1000  BP: 94/67 (!) 84/63 97/66 98/66   Pulse:   77 77  Resp: 10 10 10 11   Temp:      TempSrc:      SpO2:      Weight:      Height:       Physical Exam General:chronically ill appearing, somnolent female in NAD Heart:RRR, no mrg Lungs:CTAB anterolaterally, nml WOB Abdomen:soft, NTND Extremities:trace LLE edema, R BKA in cannister Dialysis Access: RU AVF in use   Filed Weights   09/19/21 0748 09/19/21 1058 09/21/21 0832  Weight: 58.2 kg 57.3 kg 57.1 kg    Intake/Output Summary (Last 24 hours) at 09/21/2021 1017 Last data filed at 09/20/2021 1420 Gross per 24 hour  Intake 100 ml  Output --  Net 100 ml    Additional Objective Labs: Basic Metabolic Panel: Recent Labs  Lab 09/17/21 0126 09/18/21 0136 09/18/21 0711 09/19/21 0332 09/19/21 1347 09/20/21 1612 09/21/21 0604 09/21/21 0858  NA 133* 127*  --  134*  --   --   --   --   K 5.1 6.8*   < > 4.6   < > 4.5 3.9 4.1  CL 93* 91*  --  96*  --   --   --   --   CO2 22 22  --  22  --   --   --   --   GLUCOSE 273* 451*  --  216*  --   --   --   --   BUN 30* 54*  --  72*  --   --   --   --   CREATININE 4.85* 6.15*  --  7.87*  --   --   --   --   CALCIUM 8.6* 7.4*  --  7.7*  --   --   --   --   PHOS  --  7.4*  --   --   --   --   --   --    < > = values in this interval not displayed.   Liver Function Tests: Recent Labs  Lab 09/15/21 1547  AST 38  ALT <5  ALKPHOS 141*  BILITOT 1.0  PROT 8.0  ALBUMIN 1.7*    CBC: Recent Labs  Lab 09/15/21 1547 09/16/21 0941 09/17/21 0126 09/18/21 0136 09/19/21 0332  WBC 17.9* 19.9* 21.0* 15.0* 16.5*  NEUTROABS 16.2*  --   --   --   --   HGB 9.5* 8.7* 8.4* 8.5* 8.4*  HCT 31.0* 27.7* 28.8* 28.7* 28.7*  MCV 88.8 87.4 91.7 90.5 91.1  PLT 260 300 270 322 254    Blood Culture    Component Value Date/Time   SDES BLOOD LEFT HAND 09/16/2021 0942   SPECREQUEST  09/16/2021 0942    BOTTLES DRAWN AEROBIC AND ANAEROBIC Blood Culture results may not be optimal due to an inadequate volume of blood received in culture bottles   CULT  09/16/2021 0942    NO GROWTH 4 DAYS Performed at [facility], [place] [address], [place], [place] [address]    REPTSTATUS PENDING 09/16/2021 2774    CBG: Recent Labs  Lab 09/20/21 1536 09/20/21 2021 09/21/21 0013 09/21/21 0433 09/21/21 0759  GLUCAP 181* 211* 182* 126* 78   Medications:  sodium chloride     sodium chloride     albumin human     magnesium sulfate bolus IVPB      (feeding supplement) PROSource Plus  30 mL Oral BID BM   calcitRIOL  0.25 mcg Oral BID   Chlorhexidine Gluconate Cloth  6 each Topical Q0600   Chlorhexidine Gluconate Cloth  6 each Topical Q0600   Chlorhexidine Gluconate Cloth  6 each Topical Q0600   cinacalcet  90 mg Oral Q supper   docusate sodium  100 mg Oral Daily   dorzolamide-timolol  1 drop Left Eye BID   feeding supplement (NEPRO CARB STEADY)  237 mL Oral BID BM   gabapentin  100 mg Oral TID   heparin injection (subcutaneous)  5,000 Units Subcutaneous Q8H   insulin aspart  0-6 Units Subcutaneous Q4H   insulin glargine-yfgn  10 Units Subcutaneous Daily   lidocaine-prilocaine  1 application Topical UD   midodrine       midodrine  10 mg Oral Q T,Th,Sa-HD   multivitamin  1 tablet Oral Daily   naproxen  250 mg Oral BID WC   pantoprazole  40 mg Oral Daily   sevelamer carbonate  1.6 g Oral TID WC   traMADol  100 mg Oral Q12H    Dialysis Orders: [facility] TTS, 3 hours 45 minutes, BFR 400/DFR 600, EDW 59.5 kg, 2K/2Ca UFP2   RU AV fistula 4000 unit bolus heparin Mircera 200 mcg IV every 2 weeks - last 1/26 Ancef 2 g 3 times weekly HD   Assessment/Plan: 1. 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 Trula Slade.  Plans to d/c to SNF 2. ESRD - on HD TTS.   HD today per regular schedule. K 4.1. 3. Anemia of CKD- Last Hgb 8.4.  Aranesp ordered with HD today. Transfuse pRBC prn  4. Secondary hyperparathyroidism - Corrected Ca ok, phos elevated.  Calcitriol 0.60mcg qd started. On renvela and sensipar 90mg  qd.   5. HTN/volume - Blood pressure soft without medications.  Hypotension limiting UF.  Does not appear volume overloaded.  Will need lower dry weight on d/c post amputation, ~55kg.  Added midodrine with HD this AM, albumin for BP support. 6. Severe protein calorie malnutrition - last Alb 1.7, give protein supplements.  Renal diet w/fluid restrictions.  7. AMS/somnolence - etiology unclear. ?psych 8. DMT2 - per PMD  9. Dispo - to d/c to SNF  Jen Mow, PA-C [place] 09/21/2021,10:17 AM  LOS: 6 days

## 2021-09-21 NOTE — Progress Notes (Signed)
OT and PT at bedside reported that pt's husband attempted to feed pt. Pt remains lethargic with minimal eye opening to sound and limited interaction with staff.   No respiratory distress, coughing or choking noted. SpO2 remains 100% on RA.Lung sounds remain clear throughout. HOB elevated to 90 degrees and oral suction set up at bedside.   Pt seems to be pocketing food in her cheek. Staff able to remove food from mouth and suction pt thoroughly. Pt remains with HOB elevated, no evidence of food left in mouth.  Instructed pt's husband at bedside to  not give pt food if she is drowsy/lethargic. He voices concerns regarding her poor appetite, stating pt hasn't eaten since yesterday and states he has discussed all concerns with MD.

## 2021-09-21 NOTE — Progress Notes (Signed)
OT Cancellation Note  Patient Details Name: [name] MRN: [id number] DOB: [birth year]   Cancelled Treatment:    Reason Eval/Treat Not Completed: Patient at procedure or test/ unavailable (HD)  [name] 09/21/2021, 9:49 AM Nestor Lewandowsky, OTR/L Acute Rehabilitation Services Pager: [phone] Office: [phone]

## 2021-09-21 NOTE — Progress Notes (Addendum)
Inpatient Diabetes Program Recommendations  AACE/ADA: New Consensus Statement on Inpatient Glycemic Control (2015)  Target Ranges:  Prepandial:   less than 140 mg/dL      Peak postprandial:   less than 180 mg/dL (1-2 hours)      Critically ill patients:  140 - 180 mg/dL   Lab Results  Component Value Date   GLUCAP 95 09/21/2021   HGBA1C 10.4 (H) 08/11/2021    Review of Glycemic Control  Latest Reference Range & Units 09/21/21 04:33 09/21/21 07:59  Glucose-Capillary 70 - 99 mg/dL 126 (H) 78  (H): Data is abnormally high Diabetes history: Type 2 DM (per [facility]) Current orders for Inpatient glycemic control: Novolog 0-6 units Q4H Semglee 10 units QD Inpatient Diabetes Program Recommendations:     Now that patient has a diet order, consider changing to Novolog 0-6 units TID & HS.   Attempted to speak with patient and support person regarding outpatient diabetes management. Spouse came from out of town since they have recently separated; unsure of type of diabetes, dosage of insulin or insulin taken prior to this admission.  PT at bedside attempting to arouse patient. After several attempts, patient continues to sleep soundly with no response. Will follow.  Thanks, Bronson Curb, MSN, RNC-OB Diabetes Coordinator [phone] (8a-5p)

## 2021-09-22 ENCOUNTER — Inpatient Hospital Stay (HOSPITAL_COMMUNITY): Payer: Medicare Other

## 2021-09-22 ENCOUNTER — Ambulatory Visit: Payer: Medicare Other

## 2021-09-22 DIAGNOSIS — I639 Cerebral infarction, unspecified: Secondary | ICD-10-CM

## 2021-09-22 DIAGNOSIS — R400 Somnolence: Secondary | ICD-10-CM | POA: Diagnosis not present

## 2021-09-22 DIAGNOSIS — G9340 Encephalopathy, unspecified: Secondary | ICD-10-CM

## 2021-09-22 DIAGNOSIS — M869 Osteomyelitis, unspecified: Secondary | ICD-10-CM | POA: Diagnosis not present

## 2021-09-22 DIAGNOSIS — R4182 Altered mental status, unspecified: Secondary | ICD-10-CM | POA: Diagnosis not present

## 2021-09-22 LAB — POTASSIUM: Potassium: 3.9 mmol/L (ref 3.5–5.1)

## 2021-09-22 LAB — CBC WITH DIFFERENTIAL/PLATELET
Abs Immature Granulocytes: 0.21 10*3/uL — ABNORMAL HIGH (ref 0.00–0.07)
Basophils Absolute: 0.1 10*3/uL (ref 0.0–0.1)
Basophils Relative: 0 %
Eosinophils Absolute: 0.1 10*3/uL (ref 0.0–0.5)
Eosinophils Relative: 1 %
HCT: 27.1 % — ABNORMAL LOW (ref 36.0–46.0)
Hemoglobin: 8.3 g/dL — ABNORMAL LOW (ref 12.0–15.0)
Immature Granulocytes: 1 %
Lymphocytes Relative: 10 %
Lymphs Abs: 1.5 10*3/uL (ref 0.7–4.0)
MCH: 27.9 pg (ref 26.0–34.0)
MCHC: 30.6 g/dL (ref 30.0–36.0)
MCV: 90.9 fL (ref 80.0–100.0)
Monocytes Absolute: 1.1 10*3/uL — ABNORMAL HIGH (ref 0.1–1.0)
Monocytes Relative: 7 %
Neutro Abs: 11.6 10*3/uL — ABNORMAL HIGH (ref 1.7–7.7)
Neutrophils Relative %: 81 %
Platelets: 252 10*3/uL (ref 150–400)
RBC: 2.98 MIL/uL — ABNORMAL LOW (ref 3.87–5.11)
RDW: 25.5 % — ABNORMAL HIGH (ref 11.5–15.5)
WBC: 14.5 10*3/uL — ABNORMAL HIGH (ref 4.0–10.5)
nRBC: 2.2 % — ABNORMAL HIGH (ref 0.0–0.2)

## 2021-09-22 LAB — BLOOD GAS, ARTERIAL
Acid-base deficit: 0.4 mmol/L (ref 0.0–2.0)
Bicarbonate: 23.3 mmol/L (ref 20.0–28.0)
Drawn by: 244901
FIO2: 21
O2 Saturation: 91.9 %
Patient temperature: 37
pCO2 arterial: 34.8 mmHg (ref 32.0–48.0)
pH, Arterial: 7.44 (ref 7.350–7.450)
pO2, Arterial: 63.6 mmHg — ABNORMAL LOW (ref 83.0–108.0)

## 2021-09-22 LAB — COMPREHENSIVE METABOLIC PANEL
ALT: 120 U/L — ABNORMAL HIGH (ref 0–44)
AST: 865 U/L — ABNORMAL HIGH (ref 15–41)
Albumin: 2 g/dL — ABNORMAL LOW (ref 3.5–5.0)
Alkaline Phosphatase: 102 U/L (ref 38–126)
Anion gap: 18 — ABNORMAL HIGH (ref 5–15)
BUN: 44 mg/dL — ABNORMAL HIGH (ref 6–20)
CO2: 23 mmol/L (ref 22–32)
Calcium: 7.5 mg/dL — ABNORMAL LOW (ref 8.9–10.3)
Chloride: 92 mmol/L — ABNORMAL LOW (ref 98–111)
Creatinine, Ser: 5.93 mg/dL — ABNORMAL HIGH (ref 0.44–1.00)
GFR, Estimated: 9 mL/min — ABNORMAL LOW (ref >60)
Glucose, Bld: 82 mg/dL (ref 70–99)
Potassium: 4.4 mmol/L (ref 3.5–5.1)
Sodium: 133 mmol/L — ABNORMAL LOW (ref 135–145)
Total Bilirubin: 1.4 mg/dL — ABNORMAL HIGH (ref 0.3–1.2)
Total Protein: 8.1 g/dL (ref 6.5–8.1)

## 2021-09-22 LAB — LACTIC ACID, PLASMA
Lactic Acid, Venous: 1.4 mmol/L (ref 0.5–1.9)
Lactic Acid, Venous: 1.6 mmol/L (ref 0.5–1.9)
Lactic Acid, Venous: 0.9 mmol/L (ref 0.5–1.9)

## 2021-09-22 LAB — TROPONIN I (HIGH SENSITIVITY)
Troponin I (High Sensitivity): 27 ng/L — ABNORMAL HIGH (ref <18)
Troponin I (High Sensitivity): 22 ng/L — ABNORMAL HIGH (ref <18)

## 2021-09-22 LAB — GLUCOSE, CAPILLARY
Glucose-Capillary: 77 mg/dL (ref 70–99)
Glucose-Capillary: 77 mg/dL (ref 70–99)
Glucose-Capillary: 83 mg/dL (ref 70–99)
Glucose-Capillary: 94 mg/dL (ref 70–99)
Glucose-Capillary: 94 mg/dL (ref 70–99)
Glucose-Capillary: 113 mg/dL — ABNORMAL HIGH (ref 70–99)
Glucose-Capillary: 109 mg/dL — ABNORMAL HIGH (ref 70–99)

## 2021-09-22 LAB — MRSA NEXT GEN BY PCR, NASAL: MRSA by PCR Next Gen: NOT DETECTED

## 2021-09-22 LAB — VITAMIN B12: Vitamin B-12: 2432 pg/mL — ABNORMAL HIGH (ref 180–914)

## 2021-09-22 LAB — PROCALCITONIN: Procalcitonin: 17.01 ng/mL

## 2021-09-22 MED ORDER — MAGNESIUM SULFATE IN D5W 1-5 GM/100ML-% IV SOLN
1.0000 g | Freq: Once | INTRAVENOUS | Status: AC
  Administered 2021-09-22: 1 g via INTRAVENOUS
  Filled 2021-09-22: qty 100

## 2021-09-22 MED ORDER — VANCOMYCIN HCL 1250 MG/250ML IV SOLN
1250.0000 mg | Freq: Once | INTRAVENOUS | Status: DC
  Filled 2021-09-22: qty 250

## 2021-09-22 MED ORDER — SODIUM CHLORIDE 0.9 % IV SOLN
1.0000 g | INTRAVENOUS | Status: DC
  Administered 2021-09-23: 1 g via INTRAVENOUS
  Filled 2021-09-22: qty 20

## 2021-09-22 MED ORDER — SODIUM CHLORIDE 0.9 % IV SOLN: INTRAVENOUS | Status: DC | PRN

## 2021-09-22 MED ORDER — DEXTROSE 50 % IV SOLN: 25.0000 mL | Freq: Once | INTRAVENOUS | Status: DC

## 2021-09-22 MED ORDER — ASPIRIN 300 MG RE SUPP
300.0000 mg | Freq: Every day | RECTAL | Status: DC
  Administered 2021-09-22: 300 mg via RECTAL
  Filled 2021-09-22 (×3): qty 1

## 2021-09-22 MED ORDER — VANCOMYCIN HCL IN DEXTROSE 1-5 GM/200ML-% IV SOLN
1000.0000 mg | Freq: Once | INTRAVENOUS | Status: AC
Start: 2021-09-22 — End: 2021-09-22
  Administered 2021-09-22: 1000 mg via INTRAVENOUS
  Filled 2021-09-22: qty 200

## 2021-09-22 MED ORDER — SODIUM CHLORIDE 0.9 % IV BOLUS
1000.0000 mL | INTRAVENOUS | Status: AC
  Administered 2021-09-22: 1000 mL via INTRAVENOUS

## 2021-09-22 MED ORDER — NOREPINEPHRINE 4 MG/250ML-% IV SOLN: 0.0000 ug/min | INTRAVENOUS | Status: DC

## 2021-09-22 MED ORDER — VANCOMYCIN HCL 500 MG/100ML IV SOLN
500.0000 mg | INTRAVENOUS | Status: DC
  Administered 2021-09-23: 500 mg via INTRAVENOUS
  Filled 2021-09-22: qty 100

## 2021-09-22 MED ORDER — SODIUM CHLORIDE 0.9 % IV SOLN
1.0000 g | Freq: Once | INTRAVENOUS | Status: AC
  Administered 2021-09-22: 1 g via INTRAVENOUS
  Filled 2021-09-22: qty 20

## 2021-09-22 MED ORDER — SODIUM CHLORIDE 0.9 % IV SOLN
1.0000 g | INTRAVENOUS | Status: DC
  Filled 2021-09-22: qty 20

## 2021-09-22 MED ORDER — SODIUM CHLORIDE 0.9 % IV SOLN: 250.0000 mL | INTRAVENOUS | Status: DC

## 2021-09-22 MED ORDER — SODIUM BICARBONATE 8.4 % IV SOLN: 50.0000 meq | Freq: Once | INTRAVENOUS | Status: AC

## 2021-09-22 MED ORDER — ORAL CARE MOUTH RINSE
15.0000 mL | Freq: Two times a day (BID) | OROMUCOSAL | Status: DC
  Administered 2021-09-22 – 2021-10-25 (×56): 15 mL via OROMUCOSAL

## 2021-09-22 MED ORDER — CHLORHEXIDINE GLUCONATE CLOTH 2 % EX PADS
6.0000 | MEDICATED_PAD | Freq: Every day | CUTANEOUS | Status: DC
  Administered 2021-09-23 – 2021-10-25 (×32): 6 via TOPICAL

## 2021-09-22 MED ORDER — SODIUM BICARBONATE 8.4 % IV SOLN
INTRAVENOUS | Status: AC
  Administered 2021-09-22: 50 meq via INTRAVENOUS
  Filled 2021-09-22: qty 50

## 2021-09-22 MED ORDER — ASPIRIN EC 325 MG PO TBEC
325.0000 mg | DELAYED_RELEASE_TABLET | Freq: Every day | ORAL | Status: DC
  Filled 2021-09-22: qty 1

## 2021-09-22 MED ORDER — NOREPINEPHRINE 4 MG/250ML-% IV SOLN
2.0000 ug/min | INTRAVENOUS | Status: DC
  Administered 2021-09-22: 2 ug/min via INTRAVENOUS
  Filled 2021-09-22: qty 250

## 2021-09-22 MED ORDER — IOHEXOL 350 MG/ML SOLN
100.0000 mL | Freq: Once | INTRAVENOUS | Status: AC | PRN
  Administered 2021-09-22: 100 mL via INTRAVENOUS

## 2021-09-22 NOTE — Progress Notes (Addendum)
Pharmacy Antibiotic Note  [name] is a 41 y.o. female admitted on 09/15/2021 with  possible infection of unknown source . Pt lethargic and minimally responsive this morning, full work-up underway. 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 Pharmacy has been consulted for vancomycin/meropenem dosing. Hx of "rash" allergy to PCN documented. Pt has tolerated cephalosporins in the past but not doing cefepime due to concern for possible seizures per MD. ESRD on HD TTS.  Patient previously on vancomycin pre-op, last dose 2/4 post-HD (has had 2 HD sessions since then).  Plan: Meropenem 1g IV q24h (CNS coverage dose empirically for now - plan to reduce if alternate source of infection identified) Vancomycin 1g (slightly lower dose with some vancomycin from previous likely still on board) IV x 1; then 500mg  IV qTTS HD Monitor clinical progress, c/s, abx plan/LOT Pre-HD vancomycin level as indicated F/u HD schedule/tolerance inpatient   Height: 5\' 1"  (154.9 cm) Weight: 57.1 kg (125 lb 12.4 oz) IBW/kg (Calculated) : 47.8  Temp (24hrs), Avg:97.8 F (36.6 C), Min:96.3 F (35.7 C), Max:98.6 F (37 C)  Recent Labs  Lab 09/15/21 1547 09/16/21 0941 09/17/21 0126 09/18/21 0136 09/19/21 0332 09/21/21 1354  WBC 17.9* 19.9* 21.0* 15.0* 16.5* 15.3*  CREATININE 5.39*  --  4.85* 6.15* 7.87* 3.99*    Estimated Creatinine Clearance: 14.3 mL/min (A) (by C-G formula based on SCr of 3.99 mg/dL (H)).    Allergies  Allergen Reactions   Morphine Rash and Other (See Comments)   Peanut-Containing Drug Products Anaphylaxis and Hives   Penicillins Rash    Rash in 2008.  Tolerated cefazolin in 2020   Chocolate Hives    Antimicrobials this admission: 2/10 vancomycin >>  2/10 meropenem >>   Dose adjustments this admission:   Microbiology results:   Arturo Morton, PharmD, BCPS Please check AMION for all [facility] contact numbers Clinical Pharmacist 09/22/2021 10:08 AM

## 2021-09-22 NOTE — Progress Notes (Signed)
[facility] Progress Note   Subjective:   Had dialysis yesterday with minimal UF. Limited by hypotension.  Seen in room. Very drowsy this am, difficult to arouse  Objective Vitals:   09/21/21 2350 09/21/21 2350 09/22/21 0331 09/22/21 0811  BP: 111/69 111/69 111/66 93/61  Pulse: 77 77 71 74  Resp: 15 15 15 19   Temp: 97.6 F (36.4 C) 97.6 F (36.4 C) 98.2 F (36.8 C) 98.6 F (37 C)  TempSrc:  Oral Oral Axillary  SpO2: 100% 100% 97% 99%  Weight:      Height:       Physical Exam General:chronically ill appearing, somnolent female in NAD Heart:RRR, no mrg Lungs:CTAB anterolaterally, nml WOB Abdomen:soft, NTND Extremities:trace LLE edema, R BKA in cannister Dialysis Access: RU AVF+ bruit   Filed Weights   09/19/21 1058 09/21/21 0832 09/21/21 1155  Weight: 57.3 kg 57.1 kg 57.1 kg    Intake/Output Summary (Last 24 hours) at 09/22/2021 1029 Last data filed at 09/22/2021 0900 Gross per 24 hour  Intake 0 ml  Output 555 ml  Net -555 ml     Additional Objective Labs: Basic Metabolic Panel: Recent Labs  Lab 09/18/21 0136 09/18/21 0711 09/19/21 0332 09/19/21 1347 09/21/21 0858 09/21/21 1354 09/21/21 1855  NA 127*  --  134*  --   --  132*  --   K 6.8*   < > 4.6   < > 4.1 3.3* 3.5  CL 91*  --  96*  --   --  93*  --   CO2 22  --  22  --   --  23  --   GLUCOSE 451*  --  216*  --   --  93  --   BUN 54*  --  72*  --   --  28*  --   CREATININE 6.15*  --  7.87*  --   --  3.99*  --   CALCIUM 7.4*  --  7.7*  --   --  7.8*  --   PHOS 7.4*  --   --   --   --  3.1  --    < > = values in this interval not displayed.    Liver Function Tests: Recent Labs  Lab 09/15/21 1547 09/21/21 1354  AST 38 95*  ALT <5 14  ALKPHOS 141* 88  BILITOT 1.0 1.0  PROT 8.0 7.7  ALBUMIN 1.7* 2.2*     CBC: Recent Labs  Lab 09/15/21 1547 09/16/21 0941 09/17/21 0126 09/18/21 0136 09/19/21 0332 09/21/21 1354  WBC 17.9* 19.9* 21.0* 15.0* 16.5* 15.3*  NEUTROABS 16.2*   --   --   --   --  12.5*  HGB 9.5* 8.7* 8.4* 8.5* 8.4* 8.2*  HCT 31.0* 27.7* 28.8* 28.7* 28.7* 27.0*  MCV 88.8 87.4 91.7 90.5 91.1 89.1  PLT 260 300 270 322 254 245    Blood Culture    Component Value Date/Time   SDES BLOOD LEFT HAND 09/16/2021 0942   SPECREQUEST  09/16/2021 0942    BOTTLES DRAWN AEROBIC AND ANAEROBIC Blood Culture results may not be optimal due to an inadequate volume of blood received in culture bottles   CULT  09/16/2021 0942    NO GROWTH 5 DAYS Performed at [facility], [place] [address], [place], [place] [address]    REPTSTATUS 09/21/2021 FINAL 09/16/2021 0942    CBG: Recent Labs  Lab 09/21/21 1237 09/21/21 1651 09/21/21 2031 09/22/21 0331 09/22/21 6045  GLUCAP 95 80 90 77 77    Medications:  magnesium sulfate bolus IVPB     meropenem (MERREM) IV     [START ON 09/23/2021] meropenem (MERREM) IV     vancomycin     [START ON 09/23/2021] vancomycin      (feeding supplement) PROSource Plus  30 mL Oral BID BM   calcitRIOL  0.25 mcg Oral BID   Chlorhexidine Gluconate Cloth  6 each Topical Q0600   Chlorhexidine Gluconate Cloth  6 each Topical Q0600   Chlorhexidine Gluconate Cloth  6 each Topical Q0600   cinacalcet  90 mg Oral Q supper   darbepoetin (ARANESP) injection - DIALYSIS  200 mcg Intravenous Q Thu-HD   docusate sodium  100 mg Oral Daily   dorzolamide-timolol  1 drop Left Eye BID   feeding supplement (NEPRO CARB STEADY)  237 mL Oral BID BM   gabapentin  100 mg Oral TID   heparin injection (subcutaneous)  5,000 Units Subcutaneous Q8H   insulin aspart  0-5 Units Subcutaneous QHS   insulin aspart  0-6 Units Subcutaneous TID WC   lidocaine-prilocaine  1 application Topical UD   midodrine  10 mg Oral Q T,Th,Sa-HD   multivitamin  1 tablet Oral Daily   naproxen  250 mg Oral BID WC   pantoprazole  40 mg Oral Daily   sevelamer carbonate  1.6 g Oral TID WC   traMADol  100 mg Oral Q12H    Dialysis Orders: [facility] TTS, 3 hours 45 minutes,  BFR 400/DFR 600, EDW 59.5 kg, 2K/2Ca UFP2   RU AV fistula 4000 unit bolus heparin Mircera 200 mcg IV every 2 weeks - last 1/26 Ancef 2 g 3 times weekly HD   Assessment/Plan: 1. 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 Trula Slade.  Plans to d/c to SNF 2. ESRD - on HD TTS.  Next HD 2/11 on schedule  3. Anemia of CKD- Hgb 8.2.  Aranesp 200 with HD on 2/9.  Transfuse pRBC prn  4. Secondary hyperparathyroidism - Corrected Ca ok, phos elevated.  Calcitriol 0.26mcg qd started. On renvela and sensipar 90mg  qd.   5. HTN/volume - Blood pressure soft without medications.  Hypotension limiting UF.  Does not appear volume overloaded.  Will need lower dry weight on d/c post amputation, ~55kg.  Added midodrine with HD ,albumin for BP support. 6. Severe protein calorie malnutrition - Alb 2s.  Cont protein supplements.  Renal diet w/fluid restrictions.  7. AMS/somnolence - etiology unclear. ?psych 8. DMT2 - per PMD  9. Dispo - to d/c to SNF  [facility] 09/22/2021,10:29 AM

## 2021-09-22 NOTE — Progress Notes (Signed)
Spoke to [employer] at [facility]. Clinic to fax documents to [facility] today. Pt not stable for d/c at this time per CSW. Awaiting response from [facility] in regards to MWF at [facility] vs [facility] per snf request. Will assist as needed.  Melven Sartorius Renal Navigator [phone]

## 2021-09-22 NOTE — Progress Notes (Addendum)
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  Day Neuro team already consulted.  eICU Interventions  Discussed with overnight Neuro who will see patient.  9:35 PM - suspect infectious etiology.       -Recommend AM ID consult      -Continue current antibiotics      -TTE bubble ordered      -Goal SBP 130-150 due to watershed appearance of left corona radiata infarct         Kherington Meraz Rodman Pickle 09/22/2021, 8:55 PM

## 2021-09-22 NOTE — Progress Notes (Signed)
Pt is noted to be lethargic, responds to voice. Vitals within normal limits, no resperatory distress noted. Notified Rapid Response team for further assessment. New orders received. Will continue to monitor pt.

## 2021-09-22 NOTE — Progress Notes (Signed)
Nutrition Follow-up  DOCUMENTATION CODES:  Not applicable  INTERVENTION:  Continue NPO until stable for diet. When advanced recommend regular due to poor intake. Pt would benefit from nutrition support due to poor intake and increased needs Renavite daily  NUTRITION DIAGNOSIS:  Increased nutrient needs related to wound healing, chronic illness (ESRD on HD) as evidenced by estimated needs. - ongoing  GOAL:  Patient will meet greater than or equal to 90% of their needs - not progressing  MONITOR:  PO intake, Supplement acceptance, Skin, Weight trends, Labs, I & O's  REASON FOR ASSESSMENT:  Consult Wound healing  ASSESSMENT:  41 y.o. 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 In ED, imaging suggested osteomyelitis of the right foot.  2/5 - Op, right BKA  Attempted to visit with pt today to follow-up with nutrition hx and exam. Rapid response being called during first attempt and noted that pt had second rapid called and moved to ICU this afternoon after being minimally responsive and with hypotension.   Pt has had extremely poor intake this admission. Currently NPO for decline in status.    Average Meal Intake: 2/6-2/10: 19% intake x 4 recorded meals  Nutritionally Relevant Medications: Scheduled Meds:  (feeding supplement) PROSource Plus  30 mL Oral BID BM   calcitRIOL  0.25 mcg Oral BID   cinacalcet  90 mg Oral Q supper   docusate sodium  100 mg Oral Daily   NEPRO CARB STEADY  237 mL Oral BID BM   insulin aspart  0-5 Units Subcutaneous QHS   insulin aspart  0-6 Units Subcutaneous TID WC   multivitamin  1 tablet Oral Daily   pantoprazole  40 mg Oral Daily   sevelamer carbonate  1.6 g Oral TID WC   Continuous Infusions:  magnesium sulfate bolus IVPB     meropenem (MERREM) IV     vancomycin     PRN Meds: alum & mag  hydroxide-simeth, magnesium sulfate bolus IVPB, ondansetron, phenol  Labs Reviewed: HgbA1c 10.4% (12/30)  NUTRITION - FOCUSED PHYSICAL EXAM: Defer to in-person assessment  Diet Order:   Diet Order             Diet NPO time specified  Diet effective now                   EDUCATION NEEDS:  No education needs have been identified at this time  Skin:  Skin Assessment: Skin Integrity Issues: Skin Integrity Issues:: Incisions Incisions: BKA right leg  Last BM:  2/10  Height:  Ht Readings from Last 1 Encounters:  09/17/21 5\' 1"  (1.549 m)    Weight:  Wt Readings from Last 1 Encounters:  09/21/21 57.1 kg    Ideal Body Weight:  44.9 kg (adjusted by 5.9% for BKA)  BMI:  Adjusted Body mass index is 25.3 kg/m. (Adjusted by 5.9% for BKA, using 57.1 kg)  Estimated Nutritional Needs:  Kcal:  1800-2000 kcal/d Protein:  90-100 g/d Fluid:  1L+UOP   Ranell Patrick, RD, LDN Clinical Dietitian RD pager # available in AMION  After hours/weekend pager # available in [facility]

## 2021-09-22 NOTE — Care Management Important Message (Signed)
Important Message  Patient Details  Name: [name] MRN: [id number] Date of Birth: Dec 10, 1980   Medicare Important Message Given:  Yes     Hannah Beat 09/22/2021, 11:41 AM

## 2021-09-22 NOTE — Progress Notes (Signed)
Admitting patient to room with charge nurse, blood pressures unstable. Notified MD [place] Crane. Gave new orders and implemented . Rapid response nurse notified and NP G. Harlow Mares in to assess patient. Patient responding to painful stimulus. Gave bolus of NS and placed patient in trendelenburg. MD [place] Crane gave orders to transfer to ICU, 2M02. Gave bedside report to [place].   09/22/21 1400  Vitals  BP (!) 35/26  MAP (mmHg) (!) 29  ECG Heart Rate 76  Resp 14  MEWS COLOR  MEWS Score Color Red  MEWS Score  MEWS Temp 0  MEWS Systolic 3  MEWS Pulse 0  MEWS RR 0  MEWS LOC 2  MEWS Score 5

## 2021-09-22 NOTE — Consult Note (Signed)
Neurology Consultation  Reason for Consult: Encephalopathy  Referring Physician: Dr. Nevada Crane   CC: Patient does not provide a chief complaint  History is obtained from: Chart review, patient's husband at bedside, unable to obtain from patient due to patient's condition  HPI: [name] is a 41 y.o. female with a medical history significant for ESRD on HD T/Th/Sat, type 2 diabetes mellitus with neuropathy, hypertension, right eye blindness, and medical non-adherence who presented to the ED due to lethargy and AMS following HD on 2/3. Initial work up in the ED revealed patient with leukocytosis concerning for infection and imaging with concern for osteomyelitis for which vascular surgery recommended admission for BKA on 2/5 and ongoing antibiotics. Of note, patient was discharged in January following urgent transmetatarsal amputation after being found with gangrenous toes s/p wound vac and IV Ancef after wound cultures grew MSSA. During hospitalization, patient's mental status initially improved and she was responding appropriately on evaluation on 2/4 with surgery for BKA on 2/5. On 2/6, she was noted to be somnolent and slow to respond but she was answering questions appropriately. On 2/7 patient had an episode of hypotension with HD but was noted to still be lethargic but followed commands and answered questions appropriately. Patient again had HD on 2/9 and became more lethargic prompting neurology evaluation of patient's ongoing encephalopathy.   Of note, patient's husband indicated that he and [name] have been separated for 4 months and since he has moved out, he states that [name] has not been taking care of herself. He states that he usually cared for her and that in her home she lives with her eleven-year-old son who is unable to help her care for herself.   ROS:  Unable to obtain due to altered mental status.   Past Medical History:  Diagnosis Date   Anemia    Anxiety    Blind  right eye 2008   Diabetes mellitus without complication ([facility])    Dialysis patient ([facility])    ESRD (end stage renal disease) ([place])    Dialysis T/Th/Sa   Past Surgical History:  Procedure Laterality Date   AMPUTATION Right 08/11/2021   Procedure: TRANSMETATARSAL AMPUTATION OF RIGHT FOOT;  Surgeon: Serafina Mitchell, MD;  Location: [facility];  Service: Vascular;  Laterality: Right;   AMPUTATION Right 09/17/2021   Procedure: AMPUTATION RIGHT BELOW KNEE;  Surgeon: Serafina Mitchell, MD;  Location: [facility];  Service: Vascular;  Laterality: Right;   AMPUTATION TOE Left    APPLICATION OF WOUND VAC  08/16/2021   Procedure: APPLICATION OF WOUND VAC;  Surgeon: Serafina Mitchell, MD;  Location: [facility];  Service: Vascular;;   AV FISTULA PLACEMENT     CESAREAN SECTION  2011   FISTULA SUPERFICIALIZATION Right 92/06/9416   Procedure: 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;  Surgeon: Angelia Mould, MD;  Location: [facility];  Service: Vascular;  Laterality: Right;   INSERTION OF DIALYSIS CATHETER Left 07/27/2019   Procedure: INSERTION OF TUNNELED  DIALYSIS CATHETER;  Surgeon: Angelia Mould, MD;  Location: [facility];  Service: Vascular;  Laterality: Left;   LOWER EXTREMITY ANGIOGRAPHY N/A 08/11/2021   Procedure: LOWER EXTREMITY ANGIOGRAPHY;  Surgeon: Serafina Mitchell, MD;  Location: [facility];  Service: Cardiovascular;  Laterality: N/A;   TRANSMETATARSAL AMPUTATION Right 08/16/2021   Procedure: CLOSURE OF RIGHT TRANSMETATARSAL AMPUTATION;  Surgeon: Serafina Mitchell, MD;  Location: [facility];  Service: Vascular;  Laterality: Right;   Family History  Problem Relation Age of Onset   Diabetes Mother    Diabetes Father    Social History:   reports that she has never smoked. She has never used smokeless tobacco. She reports that she does not drink alcohol and does not use drugs.  Medications  Current Facility-Administered Medications:    (feeding supplement) PROSource Plus liquid 30 mL, 30  mL, Oral, BID BM, Penninger, Lindsay, PA, 30 mL at 09/20/21 1624   alum & mag hydroxide-simeth (MAALOX/MYLANTA) 200-200-20 MG/5ML suspension 15-30 mL, 15-30 mL, Oral, Q2H PRN, Baglia, Corrina, PA-C   calcitRIOL (ROCALTROL) capsule 0.25 mcg, 0.25 mcg, Oral, BID, Baglia, Corrina, PA-C, 0.25 mcg at 09/20/21 2019   Chlorhexidine Gluconate Cloth 2 % PADS 6 each, 6 each, Topical, Q0600, Baglia, Corrina, PA-C, 6 each at 09/18/21 0417   Chlorhexidine Gluconate Cloth 2 % PADS 6 each, 6 each, Topical, Q0600, Penninger, Ria Comment, PA, 6 each at 09/20/21 0527   Chlorhexidine Gluconate Cloth 2 % PADS 6 each, 6 each, Topical, Q0600, Penninger, Ria Comment, PA, 6 each at 09/22/21 0517   cinacalcet (SENSIPAR) tablet 90 mg, 90 mg, Oral, Q supper, Penninger, [place], PA, 90 mg at 09/20/21 1804   Darbepoetin Alfa (ARANESP) injection 200 mcg, 200 mcg, Intravenous, Q Thu-HD, Penninger, Lindsay, PA   dextrose 50 % solution 25 mL, 25 mL, Intravenous, Once, Bowser, Laurel Dimmer, NP   docusate sodium (COLACE) capsule 100 mg, 100 mg, Oral, Daily, Baglia, Corrina, PA-C, 100 mg at 09/20/21 1134   dorzolamide-timolol (COSOPT) 22.3-6.8 MG/ML ophthalmic solution 1 drop, 1 drop, Left Eye, BID, Baglia, Corrina, PA-C, 1 drop at 09/21/21 2309   feeding supplement (NEPRO CARB STEADY) liquid 237 mL, 237 mL, Oral, BID BM, Baglia, Corrina, PA-C, Last Rate: 0 mL/hr at 09/18/21 1900, 237 mL at 09/20/21 1627   guaiFENesin-dextromethorphan (ROBITUSSIN DM) 100-10 MG/5ML syrup 15 mL, 15 mL, Oral, Q4H PRN, Baglia, Corrina, PA-C   heparin injection 5,000 Units, 5,000 Units, Subcutaneous, Q8H, Baglia, Corrina, PA-C, 5,000 Units at 09/22/21 0517   hydrALAZINE (APRESOLINE) injection 5 mg, 5 mg, Intravenous, Q20 Min PRN, Baglia, Corrina, PA-C   insulin aspart (novoLOG) injection 0-5 Units, 0-5 Units, Subcutaneous, QHS, Hall, Carole N, DO   insulin aspart (novoLOG) injection 0-6 Units, 0-6 Units, Subcutaneous, TID WC, Hall, Carole N, DO   labetalol (NORMODYNE)  injection 10 mg, 10 mg, Intravenous, Q10 min PRN, Baglia, Corrina, PA-C   lidocaine-prilocaine (EMLA) cream 1 application, 1 application, Topical, UD, Baglia, Corrina, PA-C   magnesium sulfate IVPB 2 g 50 mL, 2 g, Intravenous, Daily PRN, Baglia, Corrina, PA-C   meropenem (MERREM) 1 g in sodium chloride 0.9 % 100 mL IVPB, 1 g, Intravenous, Once, von Dohlen, Haley B, RPH   [START ON 09/23/2021] meropenem (MERREM) 1 g in sodium chloride 0.9 % 100 mL IVPB, 1 g, Intravenous, Q24H, von Dohlen, Haley B, RPH   metoprolol tartrate (LOPRESSOR) injection 2-5 mg, 2-5 mg, Intravenous, Q2H PRN, Baglia, Corrina, PA-C   midodrine (PROAMATINE) tablet 10 mg, 10 mg, Oral, Q T,Th,Sa-HD, Penninger, Lindsay, PA, 10 mg at 09/21/21 0905   multivitamin (RENA-VIT) tablet 1 tablet, 1 tablet, Oral, Daily, Baglia, Corrina, PA-C, 1 tablet at 09/20/21 1134   naproxen (NAPROSYN) tablet 250 mg, 250 mg, Oral, BID WC, Baglia, Corrina, PA-C, 250 mg at 09/20/21 1805   ondansetron (ZOFRAN) injection 4 mg, 4 mg, Intravenous, Q6H PRN, Baglia, Corrina, PA-C   oxyCODONE-acetaminophen (PERCOCET/ROXICET) 5-325 MG per tablet 1 tablet, 1 tablet, Oral, Q6H PRN, Baglia, Corrina, PA-C, 1 tablet at 09/16/21  2035   pantoprazole (PROTONIX) EC tablet 40 mg, 40 mg, Oral, Daily, Baglia, Corrina, PA-C, 40 mg at 09/20/21 1134   phenol (CHLORASEPTIC) mouth spray 1 spray, 1 spray, Mouth/Throat, PRN, Baglia, Corrina, PA-C   sevelamer carbonate (RENVELA) packet 1.6 g, 1.6 g, Oral, TID WC, Baglia, Corrina, PA-C, 1.6 g at 09/20/21 1625   traMADol (ULTRAM) tablet 100 mg, 100 mg, Oral, Q12H, Dang, Thuy D, RPH, 100 mg at 09/20/21 2018   vancomycin (VANCOCIN) IVPB 1000 mg/200 mL premix, 1,000 mg, Intravenous, Once, von Dohlen, Haley B, RPH   [START ON 09/23/2021] vancomycin (VANCOREADY) IVPB 500 mg/100 mL, 500 mg, Intravenous, Q T,Th,Sa-HD, von Dohlen, Haley B, RPH  Exam: Current vital signs: BP 118/69 (BP Location: Left Arm)    Pulse 73    Temp 98.8 F (37.1 C)  (Axillary)    Resp 19    Ht 5\' 1"  (1.549 m)    Wt 57.1 kg    SpO2 94%    BMI 23.76 kg/m  Vital signs in last 24 hours: Temp:  [97.5 F (36.4 C)-98.8 F (37.1 C)] 98.8 F (37.1 C) (02/10 1153) Pulse Rate:  [71-77] 73 (02/10 1153) Resp:  [14-19] 19 (02/10 0811) BP: (93-118)/(61-80) 118/69 (02/10 1153) SpO2:  [94 %-100 %] 94 % (02/10 1153)  GENERAL: Laying comfortably in hospital bed, in no acute distress Psych: Patient does not cooperate with examination throughout, somnolent Head: Normocephalic and atraumatic, without obvious abnormality EENT: Normal conjunctivae, dry mucous membranes, no OP obstruction LUNGS: Normal respiratory effort. Non-labored breathing on room air CV: Regular rate and rhythm on telemetry ABDOMEN: Soft, non-tender, non-distended Extremities: s/p right BKA with dressing over stump  NEURO:  Mental Status: Asleep, does not open eyes to stimuli throughout, does not follow commands  She does not speak other than saying "ouch" with application of noxious stimuli She does not attempt to name objects or follow commands. Does not answer orientation questions.  Cranial Nerves:  II: Right pupil is nonreactive to direct light, is 3 mm and irregular. The left pupil is 5 mm, irregular, and sluggishly reactive to light.  III, IV, VI: Does not open eyes, does not fixate or track examiner.  V: Does not blink to threat throughout   VII: Face appears symmetric resting and with movement.  VIII: Unable to assess due to patient's condition IX, X: Does not cough spontaneously, does not allow for visualization of patient's soft palate XI: Head is grossly midline XII: Does not protrude tongue Motor: Localizes with bilateral upper extremities.  There is no movement of bilateral lower extremities with application of noxious stimuli. Right BKA.  Tone and bulk are normal.  With bilateral upper extremities released above her face, patient will avoid hitting her face with her arms as she  immediately allows them to fall back to the bed.  Sensation: Localizes to noxious stimuli in bilateral upper extremities. States "ouch" with application of noxious stimuli in bilateral lower extremities.  Coordination: Does not perform   Gait: Deferred  Labs I have reviewed labs in epic and the results pertinent to this consultation are: CBC    Component Value Date/Time   WBC 15.3 (H) 09/21/2021 1354   RBC 3.03 (L) 09/21/2021 1354   HGB 8.2 (L) 09/21/2021 1354   HGB 10.6 (L) 05/18/2020 0927   HCT 27.0 (L) 09/21/2021 1354   HCT 35.1 05/18/2020 0927   PLT 245 09/21/2021 1354   PLT 186 05/18/2020 0927   MCV 89.1 09/21/2021 1354   MCV 89  05/18/2020 0927   MCH 27.1 09/21/2021 1354   MCHC 30.4 09/21/2021 1354   RDW 23.9 (H) 09/21/2021 1354   RDW 13.9 05/18/2020 0927   LYMPHSABS 1.3 09/21/2021 1354   LYMPHSABS 1.6 05/18/2020 0927   MONOABS 1.0 09/21/2021 1354   EOSABS 0.2 09/21/2021 1354   EOSABS 0.6 (H) 05/18/2020 0927   BASOSABS 0.1 09/21/2021 1354   BASOSABS 0.0 05/18/2020 0927   CMP     Component Value Date/Time   NA 132 (L) 09/21/2021 1354   NA 133 (L) 05/18/2020 0927   K 3.5 09/21/2021 1855   CL 93 (L) 09/21/2021 1354   CO2 23 09/21/2021 1354   GLUCOSE 93 09/21/2021 1354   BUN 28 (H) 09/21/2021 1354   BUN 45 (H) 05/18/2020 0927   CREATININE 3.99 (H) 09/21/2021 1354   CALCIUM 7.8 (L) 09/21/2021 1354   PROT 7.7 09/21/2021 1354   PROT 7.6 05/18/2020 0927   ALBUMIN 2.2 (L) 09/21/2021 1354   ALBUMIN 3.9 05/18/2020 0927   AST 95 (H) 09/21/2021 1354   ALT 14 09/21/2021 1354   ALKPHOS 88 09/21/2021 1354   BILITOT 1.0 09/21/2021 1354   BILITOT <0.2 05/18/2020 0927   GFRNONAA 14 (L) 09/21/2021 1354   GFRAA 6 (L) 05/18/2020 0927   Lipid Panel     Component Value Date/Time   CHOL 173 06/30/2020 1106   CHOL 195 05/18/2020 0927   TRIG 82 06/30/2020 1106   HDL 60 06/30/2020 1106   HDL 55 05/18/2020 0927   CHOLHDL 2.9 06/30/2020 1106   VLDL 16 06/30/2020 1106    LDLCALC 97 06/30/2020 1106   LDLCALC 125 (H) 05/18/2020 0927   Ammonia 2/9: 17  Lab Results  Component Value Date   TSH 3.412 09/21/2021   Results for orders placed or performed during the hospital encounter of 09/15/21  Resp Panel by RT-PCR (Flu A&B, Covid) Nasopharyngeal Swab     Status: None   Collection Time: 09/15/21  4:11 PM   Specimen: Nasopharyngeal Swab; Nasopharyngeal(NP) swabs in vial transport medium  Result Value Ref Range Status   SARS Coronavirus 2 by RT PCR NEGATIVE NEGATIVE Final    Comment: (NOTE) SARS-CoV-2 target nucleic acids are NOT DETECTED.  The SARS-CoV-2 RNA is generally detectable in upper respiratory specimens during the acute phase of infection. The lowest concentration of SARS-CoV-2 viral copies this assay can detect is 138 copies/mL. A negative result does not preclude SARS-Cov-2 infection and should not be used as the sole basis for treatment or other patient management decisions. A negative result may occur with  improper specimen collection/handling, submission of specimen other than nasopharyngeal swab, presence of viral mutation(s) within the areas targeted by this assay, and inadequate number of viral copies(<138 copies/mL). A negative result must be combined with clinical observations, patient history, and epidemiological information. The expected result is Negative.  Fact Sheet for Patients:  EntrepreneurPulse.com.au  Fact Sheet for Healthcare Providers:  IncredibleEmployment.be  This test is no t yet approved or cleared by the [place] FDA and  has been authorized for detection and/or diagnosis of SARS-CoV-2 by FDA under an Emergency Use Authorization (EUA). This EUA will remain  in effect (meaning this test can be used) for the duration of the COVID-19 declaration under Section 564(b)(1) of the Act, 21 U.S.C.section 360bbb-3(b)(1), unless the authorization is terminated  or revoked sooner.        Influenza A by PCR NEGATIVE NEGATIVE Final   Influenza B by PCR NEGATIVE NEGATIVE Final  Comment: (NOTE) The Xpert Xpress SARS-CoV-2/FLU/RSV plus assay is intended as an aid in the diagnosis of influenza from Nasopharyngeal swab specimens and should not be used as a sole basis for treatment. Nasal washings and aspirates are unacceptable for Xpert Xpress SARS-CoV-2/FLU/RSV testing.  Fact Sheet for Patients: EntrepreneurPulse.com.au  Fact Sheet for Healthcare Providers: IncredibleEmployment.be  This test is not yet approved or cleared by the [place] FDA and has been authorized for detection and/or diagnosis of SARS-CoV-2 by FDA under an Emergency Use Authorization (EUA). This EUA will remain in effect (meaning this test can be used) for the duration of the COVID-19 declaration under Section 564(b)(1) of the Act, 21 U.S.C. section 360bbb-3(b)(1), unless the authorization is terminated or revoked.  Performed at [facility], [place] [address] [place], [place] [address]   Culture, blood (routine x 2)     Status: None   Collection Time: 09/16/21  9:40 AM   Specimen: Peripheral; Blood  Result Value Ref Range Status   Specimen Description BLOOD RIGHT ANTECUBITAL  Final   Special Requests   Final    BOTTLES DRAWN AEROBIC AND ANAEROBIC Blood Culture adequate volume   Culture   Final    NO GROWTH 5 DAYS Performed at [facility], [place] [address] [place], [place] [address]    Report Status 09/21/2021 FINAL  Final  Culture, blood (routine x 2)     Status: None   Collection Time: 09/16/21  9:42 AM   Specimen: Peripheral; Blood  Result Value Ref Range Status   Specimen Description BLOOD LEFT HAND  Final   Special Requests   Final    BOTTLES DRAWN AEROBIC AND ANAEROBIC Blood Culture results may not be optimal due to an inadequate volume of blood received in culture bottles   Culture   Final    NO GROWTH 5 DAYS Performed at  [facility], [place] [address], [place], [place] [address]    Report Status 09/21/2021 FINAL  Final  Surgical pcr screen     Status: None   Collection Time: 09/17/21  6:16 AM   Specimen: Nasal Mucosa; Nasal Swab  Result Value Ref Range Status   MRSA, PCR NEGATIVE NEGATIVE Final   Staphylococcus aureus NEGATIVE NEGATIVE Final    Comment: (NOTE) The Xpert SA Assay (FDA approved for NASAL specimens in patients 66 years of age and older), is one component of a comprehensive surveillance program. It is not intended to diagnose infection nor to guide or monitor treatment. Performed at [facility], [place] [address] [place], [place] [address]   SARS CORONAVIRUS 2 (TAT 6-24 HRS) Nasopharyngeal Nasopharyngeal Swab     Status: None   Collection Time: 09/20/21  4:26 PM   Specimen: Nasopharyngeal Swab  Result Value Ref Range Status   SARS Coronavirus 2 NEGATIVE NEGATIVE Final    Comment: (NOTE) SARS-CoV-2 target nucleic acids are NOT DETECTED.  The SARS-CoV-2 RNA is generally detectable in upper and lower respiratory specimens during the acute phase of infection. Negative results do not preclude SARS-CoV-2 infection, do not rule out co-infections with other pathogens, and should not be used as the sole basis for treatment or other patient management decisions. Negative results must be combined with clinical observations, patient history, and epidemiological information. The expected result is Negative.  Fact Sheet for Patients: SugarRoll.be  Fact Sheet for Healthcare Providers: https://www.woods-mathews.com/  This test is not yet approved or cleared by the [place] and  has been authorized  for detection and/or diagnosis of SARS-CoV-2 by FDA under an Emergency Use Authorization (EUA). This EUA will remain  in effect (meaning this test can be used) for the duration of the COVID-19 declaration under Se ction 564(b)(1) of the Act,  21 U.S.C. section 360bbb-3(b)(1), unless the authorization is terminated or revoked sooner.  Performed at [facility], [place] [address] [place], [place] [address]    Imaging I have reviewed the images obtained:  CT-scan of the brain 2/10: 1. No acute finding. 2. Chronic small vessel disease and infarcts.  MRI examination of the brain pending   Assessment: 41 y.o. female who initially presented to the ED for evaluation of acute encephalopathy following HD outpatient and was found to have osteomyelitis of the right lower extremity following recent transmetatarsal amputation in January. Patient had a right BKA completed on 2/5. Hospitalization complicated by varying degrees of encephalopathy prompting neurology evaluation. - Examination reveals patient that is somnolent who does not follow commands, does not answer orientation questions, does not open eyes spontaneously or to stimuli, does not respond to pain in BLE and localizes with BUE.  - CTH imaging is without acute encephalopathy. EEG complete, results pending. - Presentation of patient's encephalopathy is likely multifactorial likely in the setting of post-operative hypoactive delirium, multiple metabolic and infectious issues, and multiple recent operations. There may also be a functional component to patient's presentation. Will obtain MRI brain for further evaluation of possible etiologies.   Recommendations: - Serum Vitamin B12 - MRI brain without contrast - EEG results pending  - Continue to work up and manage toxic/metabolic and infectious derangements as you are - Further recommendations pending initial work up  Pt seen by NP/Neuro and later by MD. August Luz to be edited by MD as needed.  Anibal Henderson, AGAC-NP [facility] Pager: [phone]   I have seen the patient and reviewed the above note. Following the evaluation reflected in the above note, there was concern for severe hypotension given cuff  pressures in the 30s. She was transferred to 110M and an a-line was placed demonstrating that the cuff pressures were inaccurate, and it is not clear the patient was ever hypotensive. On exam, she has a minimally reactive pupil on the right, sluggish on the left, she does not have a corneal on the right, weak on the left. She resists doll's maneuver, but no clear movement seen. I am able to get her to hold both arms agains gravity and she withdraws minimally on the left, no clear movemetn in the right leg. She does say "stop doing that" when I pinch her right leg.   Possibilities include multifactorial delirium as described above with underlying CN deficits(blind at baseline), but will need to exclude basilar occlusion as etiology of her symptoms.   1) CTA head and neck.  2) Further workup as above if no LVO.   Roland Rack, MD [facility] [phone]  If 7pm- 7am, please page neurology on call as listed in [place].

## 2021-09-22 NOTE — Progress Notes (Signed)
Arterial Line placement attempted 2 times unsuccessfully. MD paged and asked to insert A-Line.

## 2021-09-22 NOTE — Procedures (Signed)
[facility]  Routine EEG Report  Patient Name:   [name], [name] Date of Birth:   1980/08/28 Identification Number:   MRN - [id number]  Date of Study:   09/22/2021 12:23:19 Duration: 24 minutes  Indication: Encephalopathy  Technical Summary: A routine 20 channel electroencephalogram using the international 10-20 system of electrode placement was performed.  Background: 5-6 Hz, Poorly formed  States : Coma  Abnormalities  - Generalized Slowing: Diffuse generalized slowing - Background Slowing: The background consists of 20-50 uV, 5-6 Hz diffuse activity with superimposed diffuse polymorphic delta activity that is non reactive to external stimulation. - Focal Slowing: Focal slowing is seen intermittently in the right centrotemporal region.   Activation Procedures: Hyperventilation and Photic Stimulation: Not performed  Classification: Abnormal   Clinical Correlation:  This EEG is abnormal due to   1) mild generalized slowing. This is a nonspecific finding but is consistent with a generalized disturbance of cerebral function and may be seen in toxic, metabolic, post anoxic, multifocal or diffuse structural abnormalities.   2) Focal slowing is seen intermittently in the right centrotemporal region. This is a finding that may be consistent with a focal disturbance of cerebral function such as her recent finding of b/l ischemic events   3) No electrographic seizures were recorded.   Clinical correlation is recommended.      Dr Annabelle Harman   [facility] [phone]  Case [id number]

## 2021-09-22 NOTE — Consult Note (Addendum)
NAME:  [name], MRN:  [id number], DOB:  13-Mar-1981, LOS: 7 ADMISSION DATE:  09/15/2021, CONSULTATION DATE:  09/22/21 REFERRING MD:  [place] Crane - [facility], CHIEF COMPLAINT:  AMS   History of Present Illness:  41 yo F PMH ESRD on TTS HD, DM2, Neuropathy, HTN, medical non-compliance presented, gangrene RLE to ED 09/15/21 from HD after pt became altered after HD session. Admitted to [facility] for further care. Imaging of R foot obtained which were concerning for osteomyelitis. VVS consulted and pt underwent R BKA 2/5.  Underwent HD 2/9. Volume removal was limited by hypotension. Midodrine added, given albumin  On 2/10 AM pt was more lethargic. Rapid response was called as pt was reportedly minimally responsive to sternal rub. CT H obtained which did not reveal acute intracranial abnormality.    PCCM consulted for AMS in this setting   Pertinent  Medical History  ESRD MSSA bacteremia Medical non-compliance HTN DM2 Neuropathy Gangrene / Osteomyelitis   Significant Hospital Events: Including procedures, antibiotic start and stop dates in addition to other pertinent events   2/3 admit to [facility] 2/5 OR for F BKA 2/2 osteo 2/9 hypotensive with HD lijmiting UF, rcvd albumin and started midodrine 2/10 lethargic. CT H no acute changes. PCCM consulted   Interim History / Subjective:   Difficulty obtaining labs  HDS On RA   Objective   Blood pressure 93/61, pulse 74, temperature 98.6 F (37 C), temperature source Axillary, resp. rate 19, height 5' 1" (1.549 m), weight 57.1 kg, SpO2 99 %.        Intake/Output Summary (Last 24 hours) at 09/22/2021 1006 Last data filed at 09/22/2021 0900 Gross per 24 hour  Intake 0 ml  Output 555 ml  Net -555 ml   Filed Weights   09/19/21 1058 09/21/21 0832 09/21/21 1155  Weight: 57.3 kg 57.1 kg 57.1 kg    Examination: General: Chronically ill adult F laying in bed NAD HENT: NCAT. Oral thrush. Anicteric sclera  Lungs: CTAb on RA  Cardiovascular: rrr s1s2   Abdomen: soft ndnt + bowel sounds  Extremities: R BKA. LLE muscle wasting  Neuro: Does not follow commands. Closes mouth when asked to open mouth. Moans and withdraws to painful stimulation. + gag, + corneal reflexes. Equal and reactive pupils. Avoidance of contact with head when arms are raised above face and dropped.  GU: defer  Resolved Hospital Problem list     Assessment & Plan:   Acute encephalopathy  -?metabolic encephalopathy -- white count had been trending down, remains afebrile. Labs 2/10 pending. Last labs on 2/9 with very mild hyponatremia, and WNL ABG. POC CBG this morning low normal at 77  -has not received opiates recently, MAR reviewed.  -CT H  without acute abnormality. Fairly low suspicion for seizure -- she has reliable  Avoidance/miss of upper extremity making contact with head when arm is raised over face and dropped. Avoids face, brings arms to side, slows fall of arm and before making contact with bed.  - had HD 2/9 -- was a bit hypotensive during session which limited UF, but is not hypotensive this morning. Don't think hypoperfusion is etiology of AMS today. -query if unusual migraine presentation could yield these sx? -lastly, consider possible psychosomatic etiology. In review of records it sounds as though there were instances when pt conveyed she had loss of hearing, but could in fact hear. Avoidance of extremity contact with face could also support this.  P -ok to remain in progressive. Right now is  HDS and protecting airway -CMP, Cbc, PCT, ABG  -abx and cx ordered by primary  -primary has also consulted neuro -EEG has been ordered  -will give 1x push of IV dextrose though this will be of high yield -will dc opiates from [facility]  -could also consider MRI -- is possible there could be abnormalities not captured on CT H -- will defer to neuro   RLE osteomyelitis s/p R BKA ESRD Anemia of chronic disease DM2 Malnutrition Physical debility  -per primary    Best Practice (right click and "Reselect all SmartList Selections" daily)   Diet/type: NPO DVT prophylaxis: prophylactic heparin  GI prophylaxis: PPI Lines: N/A Foley:  N/A Code Status:  full code Last date of multidisciplinary goals of care discussion [per primary ]  Labs   CBC: Recent Labs  Lab 09/15/21 1547 09/16/21 0941 09/17/21 0126 09/18/21 0136 09/19/21 0332 09/21/21 1354  WBC 17.9* 19.9* 21.0* 15.0* 16.5* 15.3*  NEUTROABS 16.2*  --   --   --   --  12.5*  HGB 9.5* 8.7* 8.4* 8.5* 8.4* 8.2*  HCT 31.0* 27.7* 28.8* 28.7* 28.7* 27.0*  MCV 88.8 87.4 91.7 90.5 91.1 89.1  PLT 260 300 270 322 254 144    Basic Metabolic Panel: Recent Labs  Lab 09/15/21 1547 09/17/21 0126 09/18/21 0136 09/18/21 0711 09/19/21 0332 09/19/21 1347 09/20/21 1612 09/21/21 0604 09/21/21 0858 09/21/21 1354 09/21/21 1855  NA 137 133* 127*  --  134*  --   --   --   --  132*  --   K 4.8 5.1 6.8*   < > 4.6   < > 4.5 3.9 4.1 3.3* 3.5  CL 92* 93* 91*  --  96*  --   --   --   --  93*  --   CO2 21* 22 22  --  22  --   --   --   --  23  --   GLUCOSE 130* 273* 451*  --  216*  --   --   --   --  93  --   BUN 26* 30* 54*  --  72*  --   --   --   --  28*  --   CREATININE 5.39* 4.85* 6.15*  --  7.87*  --   --   --   --  3.99*  --   CALCIUM 8.6* 8.6* 7.4*  --  7.7*  --   --   --   --  7.8*  --   MG  --   --   --   --   --   --   --   --   --  1.9  --   PHOS  --   --  7.4*  --   --   --   --   --   --  3.1  --    < > = values in this interval not displayed.   GFR: Estimated Creatinine Clearance: 14.3 mL/min (A) (by C-G formula based on SCr of 3.99 mg/dL (H)). Recent Labs  Lab 09/17/21 0126 09/18/21 0136 09/19/21 0332 09/21/21 1354  WBC 21.0* 15.0* 16.5* 15.3*    Liver Function Tests: Recent Labs  Lab 09/15/21 1547 09/21/21 1354  AST 38 95*  ALT <5 14  ALKPHOS 141* 88  BILITOT 1.0 1.0  PROT 8.0 7.7  ALBUMIN 1.7* 2.2*   No results for input(s): LIPASE, AMYLASE in the last 168  hours. Recent  Labs  Lab 09/21/21 1354  AMMONIA 17    ABG    Component Value Date/Time   PHART 7.443 09/21/2021 2113   PCO2ART 37.4 09/21/2021 2113   PO2ART 98.8 09/21/2021 2113   HCO3 25.2 09/21/2021 2113   TCO2 30 06/30/2020 0237   O2SAT 98.2 09/21/2021 2113     Coagulation Profile: No results for input(s): INR, PROTIME in the last 168 hours.  Cardiac Enzymes: No results for input(s): CKTOTAL, CKMB, CKMBINDEX, TROPONINI in the last 168 hours.  HbA1C: Hemoglobin A1C  Date/Time Value Ref Range Status  09/12/2020 10:22 AM 10.3 (A) 4.0 - 5.6 % Final   Hgb A1c MFr Bld  Date/Time Value Ref Range Status  08/11/2021 04:15 AM 10.4 (H) 4.8 - 5.6 % Final    Comment:    (NOTE) Pre diabetes:          5.7%-6.4%  Diabetes:              >6.4%  Glycemic control for   <7.0% adults with diabetes   06/30/2020 11:06 AM 9.9 (H) 4.8 - 5.6 % Final    Comment:    (NOTE) Pre diabetes:          5.7%-6.4%  Diabetes:              >6.4%  Glycemic control for   <7.0% adults with diabetes     CBG: Recent Labs  Lab 09/21/21 1237 09/21/21 1651 09/21/21 2031 09/22/21 0331 09/22/21 0812  GLUCAP 95 80 90 77 77    Review of Systems:   Unable to obtain due to encephalopathy   Past Medical History:  She,  has a past medical history of Anemia, Anxiety, Blind right eye (2008), Diabetes mellitus without complication ([place]), Dialysis patient ([place]), and ESRD (end stage renal disease) ([place]).   Surgical History:   Past Surgical History:  Procedure Laterality Date   AMPUTATION Right 08/11/2021   Procedure: TRANSMETATARSAL AMPUTATION OF RIGHT FOOT;  Surgeon: Serafina Mitchell, MD;  Location: [place];  Service: Vascular;  Laterality: Right;   AMPUTATION Right 09/17/2021   Procedure: AMPUTATION RIGHT BELOW KNEE;  Surgeon: Serafina Mitchell, MD;  Location: [place];  Service: Vascular;  Laterality: Right;   AMPUTATION TOE Left    APPLICATION OF WOUND VAC  08/16/2021   Procedure: APPLICATION OF WOUND  VAC;  Surgeon: Serafina Mitchell, MD;  Location: [place] [facility];  Service: Vascular;;   AV FISTULA PLACEMENT     CESAREAN SECTION  2011   FISTULA SUPERFICIALIZATION Right 16/05/9603   Procedure: 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;  Surgeon: Angelia Mould, MD;  Location: [place];  Service: Vascular;  Laterality: Right;   INSERTION OF DIALYSIS CATHETER Left 07/27/2019   Procedure: INSERTION OF TUNNELED  DIALYSIS CATHETER;  Surgeon: Angelia Mould, MD;  Location: [place];  Service: Vascular;  Laterality: Left;   LOWER EXTREMITY ANGIOGRAPHY N/A 08/11/2021   Procedure: LOWER EXTREMITY ANGIOGRAPHY;  Surgeon: Serafina Mitchell, MD;  Location: [place] [facility];  Service: Cardiovascular;  Laterality: N/A;   TRANSMETATARSAL AMPUTATION Right 08/16/2021   Procedure: CLOSURE OF RIGHT TRANSMETATARSAL AMPUTATION;  Surgeon: Serafina Mitchell, MD;  Location: [place] [facility];  Service: Vascular;  Laterality: Right;     Social History:   reports that she has never smoked. She has never used smokeless tobacco. She reports that she does not drink alcohol and does not use drugs.   Family History:  Her family history includes Diabetes in her  father and mother.   Allergies Allergies  Allergen Reactions   Morphine Rash and Other (See Comments)   Peanut-Containing Drug Products Anaphylaxis and Hives   Penicillins Rash    Rash in 2008.  Tolerated cefazolin in 2020   Chocolate Hives     Home Medications  Prior to Admission medications   Medication Sig Start Date End Date Taking? Authorizing Provider  Biotin w/ Vitamins C & E (HAIR/SKIN/NAILS PO) Take 1 capsule by mouth daily.   Yes [provider]  calcitRIOL (ROCALTROL) 0.25 MCG capsule Take 0.25 mcg by mouth 2 (two) times daily.    Yes [provider]  ceFAZolin (ANCEF) IVPB Inject 2 g into the vein Every Tuesday,Thursday,and Saturday with dialysis. Indication:  osteomyelitis First Dose: No Last Day of Therapy:  09/21/21 with  dialysis Labs - Once weekly:  CBC/D and BMP, Labs - Every other week:  ESR and CRP Method of administration: IV Push Method of administration may be changed at the discretion of home infusion pharmacist based upon assessment of the patient and/or caregiver's ability to self-administer the medication ordered. 08/24/21 09/22/21 Yes Aline August, MD  cinacalcet (SENSIPAR) 90 MG tablet Take 90 mg by mouth daily.  09/22/19  Yes [provider]  diphenhydramine-acetaminophen (TYLENOL PM) 25-500 MG TABS tablet Take 1 tablet by mouth at bedtime as needed (sleep).   Yes [provider]  dorzolamide-timolol (COSOPT) 22.3-6.8 MG/ML ophthalmic solution Place 1 drop into the left eye 2 (two) times daily. 03/30/21  Yes [provider]  lidocaine-prilocaine (EMLA) cream Apply 1 application topically as directed.  09/07/19  Yes [provider]  losartan (COZAAR) 50 MG tablet Take 1 tablet (50 mg total) by mouth daily. 07/02/20  Yes Pokhrel, Laxman, MD  multivitamin (RENA-VIT) TABS tablet Take 1 tablet by mouth daily. 07/18/19  Yes Fulp, Cammie, MD  oxyCODONE-acetaminophen (PERCOCET) 5-325 MG tablet Take 1 tablet by mouth every 6 (six) hours as needed for severe pain. 09/04/21  Yes Ulyses Amor, PA-C  sevelamer carbonate (RENVELA) 0.8 g PACK packet Take 1.6 g by mouth 3 (three) times daily with meals. 08/24/21 09/23/21 Yes Aline August, MD  Accu-Chek FastClix Lancets MISC Use as instructed to check blood sugar up to TID. E11.22 Patient taking differently: 1 each by Other route See admin instructions. Use as instructed to check blood sugar up to TID. E11.22 04/15/19   Charlott Rakes, MD  Blood Glucose Monitoring Suppl (ACCU-CHEK GUIDE ME) w/Device KIT 1 kit by Does not apply route 3 (three) times daily. Use to check BG at home up to 3 times daily. E11.22 04/15/19   Charlott Rakes, MD  Continuous Blood Gluc Receiver (DEXCOM G6 RECEIVER) DEVI 1 Device by Does not apply route as directed.  09/12/20   Shamleffer, Melanie Crazier, MD  Continuous Blood Gluc Sensor (DEXCOM G6 SENSOR) MISC 1 Device by Does not apply route as directed. 09/12/20   Shamleffer, Melanie Crazier, MD  Continuous Blood Gluc Transmit (DEXCOM G6 TRANSMITTER) MISC 1 Device by Does not apply route as directed. 09/12/20   Shamleffer, Melanie Crazier, MD  glucose blood (ACCU-CHEK GUIDE) test strip Use as instructed to check blood sugar up to TID. E11.22 Patient taking differently: 1 each by Other route See admin instructions. Use as instructed to check blood sugar up to TID. E11.22 07/17/19   Fulp, Cammie, MD  Insulin Pen Needle 32G X 4 MM MISC 1 Device by Does not apply route as directed. 05/13/20   Shamleffer, Melanie Crazier, MD  Critical care time: n/a      Eliseo Gum MSN, AGACNP-BC [place] for pager  09/22/2021, 11:17 AM

## 2021-09-22 NOTE — Plan of Care (Signed)
°  Problem: Education: Goal: Knowledge of General Education information will improve Description: Including pain rating scale, medication(s)/side effects and non-pharmacologic comfort measures Outcome: Not Progressing   Problem: Health Behavior/Discharge Planning: Goal: Ability to manage health-related needs will improve Outcome: Not Progressing   Problem: Clinical Measurements: Goal: Ability to maintain clinical measurements within normal limits will improve Outcome: Not Progressing Goal: Diagnostic test results will improve Outcome: Not Progressing

## 2021-09-22 NOTE — Progress Notes (Addendum)
PROGRESS NOTE  [name] EHU:[id number] DOB: 1980-11-26 DOA: 09/15/2021 PCP: [facility]  HPI/Recap of past 24 hours: [name] is a 41 y.o. female with medical history significant for DMT2, ESRD on HD TTS, polyneuropathy, HTN, noncompliance, who was sent to the hospital after completing dialysis session due to concern for altered mental status.  Work-up revealed right lower extremity osteomyelitis seen on MRI status post right below the knee amputation on 09/16/2021.  Plan for SNF placement.  Hospital course complicated by lethargy on 09/21/2021 after hemodialysis.  Persistent lethargy on 09/22/2021.  PCCM and neurology consulted to further assist with the management.  Ongoing work-up for AMS.  Vital signs, CBG, ammonia, vitamin B12, blood cultures, CT head, ABG, unrevealing.  WBC was downtrending yesterday however today we are having difficulty obtaining blood, IV team has been consulted.  We will treat empirically with IV antibiotics, Zosyn, IV vancomycin.  Will obtain MRSA screening test.  Blood cultures x2 peripherally.  As we are awaiting for the results of the CBC, lactic acid, procalcitonin.  09/22/21: Patient was seen and examined at her bedside.  Her husband was present in the room.  She is lethargic and minimally responsive.  Concern about her ability to protect her airway, will be seen by PCCM.    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 Lethargic after hemodialysis on 09/21/2021, BP and CBGs are stable. Vital signs, CBG, ammonia, vitamin B12, blood cultures, CT head, ABG,  unrevealing. Difficult stick, unable to obtain labs this morning.  IV team consult placed. WBC was downtrending yesterday however today we are having difficulty obtaining blood, IV team has been consulted.  We will treat empirically with IV antibiotics, Zosyn, IV vancomycin.  Will obtain MRSA screening test.  Blood cultures x2 peripherally.  As we are awaiting for the results of the CBC, lactic acid, procalcitonin. Follow chest x-ray and abdominal x-ray. Follow 12 lead EKG Will be seen by neurology and PCCM. N.p.o. until more alert   Right lower extremity osteomyelitis seen on MRI, status post right below the knee amputation on 09/17/2021 by vascular surgery Dr. Trula Slade. 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 Trula Slade  Abx discontinued since BKA Monitor fever curve and WBC. Follow labs.  ESRD HD TTS  Last HD 09/21/2021. Volume status and electrolytes managed by nephrology. Continue midodrine to avoid hypotension.  Anemia of chronic disease in the setting of ESRD.  Hemoglobin 8.4 from 8.5. Follow CBC    Diabetes mellitus type 2 with hyperglycemia POA Hemoglobin A1c 10.4 on 08/11/2021  Hold off Semglee due to NPO. Insulin sliding scale for hyperglycemia. Avoid hypoglycemia.   Hypoalbuminemia POA Albumin 1.7  Nutrition consulted   Resolved AG metabolic acidosis POA Likely from infection and missed HD Management by hemodialysis.  Physical debility Per vascular surgery disposition will be to SNF. Patient's husband is agreeable to SNF. Not medically cleared for discharge until AMS improved.   Critical care time: 65 minutes.     DVT prophylaxis: Heparin SQ heparin Code Status: Full Family Communication: Updated husband at her bedside. Disposition Plan:  Status is: Inpatient Remains inpatient appropriate because: Plan to  discharge to SNF on 09/25/2021 if AMS is improved.             Objective: Vitals:   09/21/21 2350 09/21/21 2350 09/22/21  0331 09/22/21 0811  BP: 111/69 111/69 111/66 93/61  Pulse: 77 77 71 74  Resp: 15 15 15 19   Temp: 97.6 F (36.4 C) 97.6 F (36.4 C) 98.2 F (36.8 C) 98.6 F (37 C)  TempSrc:  Oral Oral Axillary  SpO2: 100% 100% 97% 99%  Weight:      Height:        Intake/Output Summary (Last 24 hours) at 09/22/2021 0951 Last data filed at 09/22/2021 0400 Gross per 24 hour  Intake 0 ml  Output 555 ml  Net -555 ml   Filed Weights   09/19/21 1058 09/21/21 0832 09/21/21 1155  Weight: 57.3 kg 57.1 kg 57.1 kg    Exam:  General: 41 y.o. year-old female frail-appearing no acute stress.  Minimally responsive. Cardiovascular: Regular rate and rhythm no rubs or gallops. Respiratory: Mild rales at bases no wheezing noted.  Poor inspiratory effort. Abdomen: Soft nontender hypoactive bowel sounds present.  Nondistended. Musculoskeletal: Right below the knee amputation.   Skin: No ulcerative lesions or rashes noted. Psychiatry: Unable to assess due to obtundation. Neuro: Does not follow commands.   Data Reviewed: CBC: Recent Labs  Lab 09/15/21 1547 09/16/21 0941 09/17/21 0126 09/18/21 0136 09/19/21 0332 09/21/21 1354  WBC 17.9* 19.9* 21.0* 15.0* 16.5* 15.3*  NEUTROABS 16.2*  --   --   --   --  12.5*  HGB 9.5* 8.7* 8.4* 8.5* 8.4* 8.2*  HCT 31.0* 27.7* 28.8* 28.7* 28.7* 27.0*  MCV 88.8 87.4 91.7 90.5 91.1 89.1  PLT 260 300 270 322 254 001   Basic Metabolic Panel: Recent Labs  Lab 09/15/21 1547 09/17/21 0126 09/18/21 0136 09/18/21 0711 09/19/21 0332 09/19/21 1347 09/20/21 1612 09/21/21 0604 09/21/21 0858 09/21/21 1354 09/21/21 1855  NA 137 133* 127*  --  134*  --   --   --   --  132*  --   K 4.8 5.1 6.8*   < > 4.6   < > 4.5 3.9 4.1 3.3* 3.5  CL 92* 93* 91*  --  96*  --   --   --   --  93*  --   CO2 21* 22 22  --  22  --   --   --   --  23  --   GLUCOSE 130* 273* 451*  --  216*  --   --   --   --  93  --   BUN 26* 30* 54*  --  72*  --   --   --   --  28*  --   CREATININE 5.39*  4.85* 6.15*  --  7.87*  --   --   --   --  3.99*  --   CALCIUM 8.6* 8.6* 7.4*  --  7.7*  --   --   --   --  7.8*  --   MG  --   --   --   --   --   --   --   --   --  1.9  --   PHOS  --   --  7.4*  --   --   --   --   --   --  3.1  --    < > = values in this interval not displayed.  GFR: Estimated Creatinine Clearance: 14.3 mL/min (A) (by C-G formula based on SCr of 3.99 mg/dL (H)). Liver Function Tests: Recent Labs  Lab 09/15/21 1547 09/21/21 1354  AST 38 95*  ALT <5 14  ALKPHOS 141* 88  BILITOT 1.0 1.0  PROT 8.0 7.7  ALBUMIN 1.7* 2.2*   No results for input(s): LIPASE, AMYLASE in the last 168 hours. Recent Labs  Lab 09/21/21 1354  AMMONIA 17   Coagulation Profile: No results for input(s): INR, PROTIME in the last 168 hours. Cardiac Enzymes: No results for input(s): CKTOTAL, CKMB, CKMBINDEX, TROPONINI in the last 168 hours. BNP (last 3 results) No results for input(s): PROBNP in the last 8760 hours. HbA1C: No results for input(s): HGBA1C in the last 72 hours. CBG: Recent Labs  Lab 09/21/21 1237 09/21/21 1651 09/21/21 2031 09/22/21 0331 09/22/21 0812  GLUCAP 95 80 90 77 77   Lipid Profile: No results for input(s): CHOL, HDL, LDLCALC, TRIG, CHOLHDL, LDLDIRECT in the last 72 hours. Thyroid Function Tests: Recent Labs    09/21/21 0604  TSH 3.412   Anemia Panel: No results for input(s): VITAMINB12, FOLATE, FERRITIN, TIBC, IRON, RETICCTPCT in the last 72 hours. Urine analysis:    Component Value Date/Time   COLORURINE YELLOW 02/21/2019 1955   APPEARANCEUR TURBID (A) 02/21/2019 1955   LABSPEC 1.012 02/21/2019 1955   PHURINE 7.0 02/21/2019 1955   GLUCOSEU >=500 (A) 02/21/2019 1955   HGBUR MODERATE (A) 02/21/2019 1955   BILIRUBINUR NEGATIVE 02/21/2019 [place] NEGATIVE 02/21/2019 1955   PROTEINUR 100 (A) 02/21/2019 1955   NITRITE NEGATIVE 02/21/2019 1955   LEUKOCYTESUR MODERATE (A) 02/21/2019 1955   Sepsis  Labs: @LABRCNTIP (procalcitonin:4,lacticidven:4)  ) Recent Results (from the past 240 hour(s))  Resp Panel by RT-PCR (Flu A&B, Covid) Nasopharyngeal Swab     Status: None   Collection Time: 09/15/21  4:11 PM   Specimen: Nasopharyngeal Swab; Nasopharyngeal(NP) swabs in vial transport medium  Result Value Ref Range Status   SARS Coronavirus 2 by RT PCR NEGATIVE NEGATIVE Final    Comment: (NOTE) SARS-CoV-2 target nucleic acids are NOT DETECTED.  The SARS-CoV-2 RNA is generally detectable in upper respiratory specimens during the acute phase of infection. The lowest concentration of SARS-CoV-2 viral copies this assay can detect is 138 copies/mL. A negative result does not preclude SARS-Cov-2 infection and should not be used as the sole basis for treatment or other patient management decisions. A negative result may occur with  improper specimen collection/handling, submission of specimen other than nasopharyngeal swab, presence of viral mutation(s) within the areas targeted by this assay, and inadequate number of viral copies(<138 copies/mL). A negative result must be combined with clinical observations, patient history, and epidemiological information. The expected result is Negative.  Fact Sheet for Patients:  EntrepreneurPulse.com.au  Fact Sheet for Healthcare Providers:  IncredibleEmployment.be  This test is no t yet approved or cleared by the [place] FDA and  has been authorized for detection and/or diagnosis of SARS-CoV-2 by FDA under an Emergency Use Authorization (EUA). This EUA will remain  in effect (meaning this test can be used) for the duration of the COVID-19 declaration under Section 564(b)(1) of the Act, 21 U.S.C.section 360bbb-3(b)(1), unless the authorization is terminated  or revoked sooner.       Influenza A by PCR NEGATIVE NEGATIVE Final   Influenza B by PCR NEGATIVE NEGATIVE Final    Comment: (NOTE) The Xpert  Xpress SARS-CoV-2/FLU/RSV plus assay is intended as an aid in the diagnosis of influenza  from Nasopharyngeal swab specimens and should not be used as a sole basis for treatment. Nasal washings and aspirates are unacceptable for Xpert Xpress SARS-CoV-2/FLU/RSV testing.  Fact Sheet for Patients: EntrepreneurPulse.com.au  Fact Sheet for Healthcare Providers: IncredibleEmployment.be  This test is not yet approved or cleared by the [place] FDA and has been authorized for detection and/or diagnosis of SARS-CoV-2 by FDA under an Emergency Use Authorization (EUA). This EUA will remain in effect (meaning this test can be used) for the duration of the COVID-19 declaration under Section 564(b)(1) of the Act, 21 U.S.C. section 360bbb-3(b)(1), unless the authorization is terminated or revoked.  Performed at [facility], [place] [address], [place], [place] [address]   Culture, blood (routine x 2)     Status: None   Collection Time: 09/16/21  9:40 AM   Specimen: BLOOD  Result Value Ref Range Status   Specimen Description BLOOD RIGHT ANTECUBITAL  Final   Special Requests   Final    BOTTLES DRAWN AEROBIC AND ANAEROBIC Blood Culture adequate volume   Culture   Final    NO GROWTH 5 DAYS Performed at [facility], [place] [address] [place], [place] [address]    Report Status 09/21/2021 FINAL  Final  Culture, blood (routine x 2)     Status: None   Collection Time: 09/16/21  9:42 AM   Specimen: BLOOD LEFT HAND  Result Value Ref Range Status   Specimen Description BLOOD LEFT HAND  Final   Special Requests   Final    BOTTLES DRAWN AEROBIC AND ANAEROBIC Blood Culture results may not be optimal due to an inadequate volume of blood received in culture bottles   Culture   Final    NO GROWTH 5 DAYS Performed at [facility], [place] [address], [place], [place] [address]    Report Status 09/21/2021 FINAL  Final  Surgical pcr screen     Status: None    Collection Time: 09/17/21  6:16 AM   Specimen: Nasal Mucosa; Nasal Swab  Result Value Ref Range Status   MRSA, PCR NEGATIVE NEGATIVE Final   Staphylococcus aureus NEGATIVE NEGATIVE Final    Comment: (NOTE) The Xpert SA Assay (FDA approved for NASAL specimens in patients 62 years of age and older), is one component of a comprehensive surveillance program. It is not intended to diagnose infection nor to guide or monitor treatment. Performed at [facility], [place] [address] [place], [place] [address]   SARS CORONAVIRUS 2 (TAT 6-24 HRS) Nasopharyngeal Nasopharyngeal Swab     Status: None   Collection Time: 09/20/21  4:26 PM   Specimen: Nasopharyngeal Swab  Result Value Ref Range Status   SARS Coronavirus 2 NEGATIVE NEGATIVE Final    Comment: (NOTE) SARS-CoV-2 target nucleic acids are NOT DETECTED.  The SARS-CoV-2 RNA is generally detectable in upper and lower respiratory specimens during the acute phase of infection. Negative results do not preclude SARS-CoV-2 infection, do not rule out co-infections with other pathogens, and should not be used as the sole basis for treatment or other patient management decisions. Negative results must be combined with clinical observations, patient history, and epidemiological information. The expected result is Negative.  Fact Sheet for Patients: SugarRoll.be  Fact Sheet for Healthcare Providers: https://www.woods-mathews.com/  This test is not yet approved or cleared by the [place] FDA and  has been authorized for detection and/or diagnosis of SARS-CoV-2 by FDA under an Emergency Use Authorization (EUA). This EUA will remain  in effect (meaning this test can be used) for the duration of the COVID-19 declaration under Se ction 564(b)(1) of the Act, 21 U.S.C. section 360bbb-3(b)(1), unless the authorization is terminated or revoked sooner.  Performed at [facility], [place]  [address] [place], [place] [address]       Studies: CT HEAD WO CONTRAST (5MM)  Result Date: 09/22/2021 CLINICAL DATA:  [place] EXAM: CT HEAD WITHOUT CONTRAST TECHNIQUE: Contiguous axial images were obtained from the base of the skull through the vertex without intravenous contrast. RADIATION DOSE REDUCTION: This exam was performed according to the departmental dose-optimization program which includes automated exposure control, adjustment of the mA and/or kV according to patient size and/or use of iterative reconstruction technique. COMPARISON:  Head CT and brain MRI 06/30/2020 FINDINGS: Brain: Chronic left cerebellar and left parietal infarcts. Premature small vessel ischemia. No evidence of acute infarct, hemorrhage, hydrocephalus, or mass. Vascular: Diffuse arterial calcification correlating with end-stage renal disease history. 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 Bilateral cataract resection. IMPRESSION: 1. No acute finding. 2. Chronic small vessel disease and infarcts. Electronically Signed   By: Jorje Guild M.D.   On: 09/22/2021 07:33    Scheduled Meds:  (feeding supplement) PROSource Plus  30 mL Oral BID BM   calcitRIOL  0.25 mcg Oral BID   Chlorhexidine Gluconate Cloth  6 each Topical Q0600   Chlorhexidine Gluconate Cloth  6 each Topical Q0600   Chlorhexidine Gluconate Cloth  6 each Topical Q0600   cinacalcet  90 mg Oral Q supper   darbepoetin (ARANESP) injection - DIALYSIS  200 mcg Intravenous Q Thu-HD   docusate sodium  100 mg Oral Daily   dorzolamide-timolol  1 drop Left Eye BID   feeding supplement (NEPRO CARB STEADY)  237 mL Oral BID BM   gabapentin  100 mg Oral TID   heparin injection (subcutaneous)  5,000 Units Subcutaneous Q8H   insulin aspart  0-5 Units Subcutaneous QHS   insulin aspart  0-6 Units Subcutaneous TID WC   lidocaine-prilocaine  1 application Topical UD   midodrine   10 mg Oral Q T,Th,Sa-HD   multivitamin  1 tablet Oral Daily   naproxen  250 mg Oral BID WC   pantoprazole  40 mg Oral Daily   sevelamer carbonate  1.6 g Oral TID WC   traMADol  100 mg Oral Q12H    Continuous Infusions:  magnesium sulfate bolus IVPB       LOS: 7 days     Kayleen Memos, MD [facility] Pager [phone]  If 7PM-7AM, please contact night-coverage [email or web address] Password [facility] 09/22/2021, 9:51 AM

## 2021-09-22 NOTE — TOC Progression Note (Signed)
Transition of Care [facility]) - Progression Note    Patient Details  Name: [name] MRN: [id number] Date of Birth: 1981/01/22  Transition of Care [facility]) CM/SW [place], [place] Phone Number: 09/22/2021, 10:19 AM  Clinical Narrative:    CSW was notified by MD that pt is not medically ready and may be here for a few more days. Barriers at this time include oral intake, lethargy, continued medical workup, working on securing an outpatient HD seat, and pt's inability to sit for HD in the outpatient setting at this time. CSW updated facility and they will continue to follow for admission. Pt will not need an insurance authorization upon admission. Carollee Leitz [phone])  w/ [facility] is facility contact. CSW updated Linus Orn, Renal navigator, who continues to follow to secure an HD bed. TOC will continue to follow for DC needs.   Expected Discharge Plan: Skilled Nursing Facility Barriers to Discharge: Continued Medical Work up, SNF Pending bed offer  Expected Discharge Plan and Services Expected Discharge Plan: [place] In-house Referral: Clinical Social Work   Post Acute Care Choice: [place] Living arrangements for the past 2 months: Apartment                                       Social Determinants of Health (SDOH) Interventions    Readmission Risk Interventions Readmission Risk Prevention Plan 08/23/2021  Transportation Screening Complete  HRI or Home Care Consult Complete  Social Work Consult for [place] Planning/Counseling Complete  Palliative Care Screening Not Applicable  Medication Review Press photographer) Complete  Some recent data might be hidden

## 2021-09-22 NOTE — Progress Notes (Signed)
EEG completed, results pending. 

## 2021-09-22 NOTE — Progress Notes (Signed)
S: MRI completed, revealing multifocal punctate acute foci of DWI abnormality in the bilateral cerebral hemispheres.   O: BP (!) 64/48 Comment: Unable to obtain accurate cuff BP. Strong palpable carotid + femoral pulse. MD aware   Pulse 72    Temp (!) 94 F (34.4 C) (Axillary)    Resp 12    Ht 5\' 1"  (1.549 m)    Wt 57.1 kg    SpO2 95%    BMI 23.76 kg/m   Neurological Exam: Ment: Murmurs at times "stop" in response to stimuli such as passively moving her arms, without opening her eyes. Does not give coherent answers to questions. Does not follow any commands. No purposeful movements.  CN: PERR. Eyes are mildly dysconjugate/exotropic and near the midline.Does not gaze towards or away from visual stimuli but does attempt to close eyes in response to penlight. Face with flaccid tone and mild asymmetry at rest.   Motor: Does not follow any commands. Moves BUE slightly to stimuli but not antigravity.  No movement of BLE to light stimuli. Right BKA noted. Decreased muscle bulk to BLE.  Cerebellar/Gait: Unable to assess   CTA of head and neck: 1. No large vessel occlusion. 2. Intracranial atherosclerosis including moderate right and mild left ICA stenoses. 3. Widely patent cervical carotid arteries. 4. Mild left V2 vertebral artery stenosis. 5. Scattered small peribronchovascular pulmonary densities in both upper lobes, likely infectious or inflammatory  MRI brain: 1. Scattered punctate acute infarcts throughout both cerebral hemispheres which may be embolic. 2. More confluent region of potentially subacute infarction in the left corona radiata. 3. Chronic ischemia with multiple chronic infarcts as above.  EEG: 1) Mild generalized slowing. This is a nonspecific finding but is consistent with a generalized disturbance of cerebral function and may be seen in toxic, metabolic, post anoxic, multifocal or diffuse structural abnormalities.   2) Focal slowing is seen intermittently in the right  centrotemporal region. This is a finding that may be consistent with a focal disturbance of cerebral function such as her recent finding of b/l ischemic events   3) No electrographic seizures were recorded.   Assessment: 41 y.o. female who initially presented to the ED for evaluation of acute encephalopathy following HD outpatient and was found to have osteomyelitis of the right lower extremity following recent transmetatarsal amputation in January. Patient had a right BKA completed on 2/5. Hospitalization complicated by varying degrees of encephalopathy prompting neurology evaluation. - Examination reveals patient that is somnolent who does not follow commands, does not answer orientation questions, does not open eyes spontaneously or to stimuli, does not respond to pain in BLE and moves BUE slightly to noxious.  - Multifocal acute punctate strokes are seen in the bilateral cerebral hemispheres on MRI.  - A component appears most consistent with subacute watershed infarctions, which can occur due to hypotension in conjunction with focal arterial stenoses, as were seen on CTA - A second component, the multifocal punctate foci of abnormal DWI signal, may be due to cardioembolic strokes, vasculitis or may represent early multifocal abscess formation given the abnormal lung imaging findings as well as her infection.  - EEG is consistent with a diffuse encephalopathy in addition to focal disturbance of cortical function in the right centrotemporal region. No electrographic seizures were seen.  - Presentation of patient's encephalopathy is likely multifactorial likely in the setting of post-operative hypoactive delirium, multiple metabolic and infectious issues, and multiple recent operations.     Recommendations: - Empiric antibiotics for possible multifocal  infectious cerebral emboli - ID consult. There may be a connection between the possibly infectious pulmonary nodules on CT, her osteomyelitis and the  punctate DWI abnormalities on MRI - TTE with bubble study to assess for possible valvular vegetation, mural thrombus or PFO. If negative will need TEE.  - Cardiac telemetry - Starting ASA 325 mg po qd. Can also use a 300 mg suppository if unable to take PO and no NGT - BP management with SBP goal of 130-150. The punctate strokes will not have an appreciable hypoperfused penumbra due to their tiny size, but want to avoid hypotension due to the watershed appearance of the left corona radiata stroke.  - Frequent neuro checks - Stroke Team to follow in the AM  35 minutes spent in the neurological evaluation and management of this critically ill patient.   Electronically signed: Dr. Kerney Elbe

## 2021-09-22 NOTE — Progress Notes (Addendum)
°  Progress Note    09/22/2021 7:56 AM 5 Days Post-Op  Subjective:  lethargic this am  Afebrile  Vitals:   09/21/21 2350 09/22/21 0331  BP: 111/69 111/66  Pulse: 77 71  Resp: 15 15  Temp: 97.6 F (36.4 C) 98.2 F (36.8 C)  SpO2: 100% 97%    Physical Exam: Incisions:     Palpable right femoral pulse  CBC    Component Value Date/Time   WBC 15.3 (H) 09/21/2021 1354   RBC 3.03 (L) 09/21/2021 1354   HGB 8.2 (L) 09/21/2021 1354   HGB 10.6 (L) 05/18/2020 0927   HCT 27.0 (L) 09/21/2021 1354   HCT 35.1 05/18/2020 0927   PLT 245 09/21/2021 1354   PLT 186 05/18/2020 0927   MCV 89.1 09/21/2021 1354   MCV 89 05/18/2020 0927   MCH 27.1 09/21/2021 1354   MCHC 30.4 09/21/2021 1354   RDW 23.9 (H) 09/21/2021 1354   RDW 13.9 05/18/2020 0927   LYMPHSABS 1.3 09/21/2021 1354   LYMPHSABS 1.6 05/18/2020 0927   MONOABS 1.0 09/21/2021 1354   EOSABS 0.2 09/21/2021 1354   EOSABS 0.6 (H) 05/18/2020 0927   BASOSABS 0.1 09/21/2021 1354   BASOSABS 0.0 05/18/2020 0927    BMET    Component Value Date/Time   NA 132 (L) 09/21/2021 1354   NA 133 (L) 05/18/2020 0927   K 3.5 09/21/2021 1855   CL 93 (L) 09/21/2021 1354   CO2 23 09/21/2021 1354   GLUCOSE 93 09/21/2021 1354   BUN 28 (H) 09/21/2021 1354   BUN 45 (H) 05/18/2020 0927   CREATININE 3.99 (H) 09/21/2021 1354   CALCIUM 7.8 (L) 09/21/2021 1354   GFRNONAA 14 (L) 09/21/2021 1354   GFRAA 6 (L) 05/18/2020 0927    INR    Component Value Date/Time   INR 1.2 08/10/2021 2212     Intake/Output Summary (Last 24 hours) at 09/22/2021 0756 Last data filed at 09/22/2021 0400 Gross per 24 hour  Intake 0 ml  Output 555 ml  Net -555 ml     Assessment/Plan:  41 y.o. female is s/p right below knee amputation  5 Days Post-Op  -right BKA stump is cool to touch and there is skin changes on the stump that appear new from a couple of days ago.  Will need to keep a close eye on this.   -no evidence of infection currently.  Pt is  afebrile -primary team following her lethargy     Tracey Locket, PA-C [facility] [phone] 09/22/2021 7:56 AM    I agree with the above.  There are some concerning signs on her below-knee amputation that suggested this may have difficulty healing.  This will need to be monitored.  We will follow-up on Monday.  [name]

## 2021-09-22 NOTE — Significant Event (Signed)
Rapid Response Event Note   Reason for Call : Hypotension  Initial Focused Assessment: Called for low BPs. Pt just arrived from 6N. Monitor cuff pressures were 70s/20s. Manual pressures were 90s/50s. Femoral pulse present. Skin is cool and dry. Lungs are clear bilaterally. Pt is minimally responsive, responds only to pain. CCM MD and NP at bedside shortly after my arrival and managed care.   VS: Manual BP 92/58, HR 83 CBG: 94  Interventions:  -1L NS bolus per MD -One Amp Sodium Bicarb per CCM NP.  Plan of Care:  -Tx 75M ICU  Event Summary:   MD Notified: Dr. Lynetta Mare at bedside Call Time: [place] Time: [id number] End Time: [place]  Fulton Reek, RN

## 2021-09-23 ENCOUNTER — Inpatient Hospital Stay (HOSPITAL_COMMUNITY): Payer: Medicare Other

## 2021-09-23 ENCOUNTER — Encounter (HOSPITAL_COMMUNITY): Payer: Medicare Other

## 2021-09-23 DIAGNOSIS — I6389 Other cerebral infarction: Secondary | ICD-10-CM

## 2021-09-23 DIAGNOSIS — I6340 Cerebral infarction due to embolism of unspecified cerebral artery: Secondary | ICD-10-CM | POA: Diagnosis not present

## 2021-09-23 DIAGNOSIS — D638 Anemia in other chronic diseases classified elsewhere: Secondary | ICD-10-CM | POA: Diagnosis not present

## 2021-09-23 DIAGNOSIS — R6521 Severe sepsis with septic shock: Secondary | ICD-10-CM

## 2021-09-23 DIAGNOSIS — I749 Embolism and thrombosis of unspecified artery: Secondary | ICD-10-CM

## 2021-09-23 DIAGNOSIS — N186 End stage renal disease: Secondary | ICD-10-CM | POA: Diagnosis not present

## 2021-09-23 DIAGNOSIS — G9341 Metabolic encephalopathy: Secondary | ICD-10-CM

## 2021-09-23 DIAGNOSIS — A419 Sepsis, unspecified organism: Secondary | ICD-10-CM

## 2021-09-23 LAB — COMPREHENSIVE METABOLIC PANEL
ALT: 108 U/L — ABNORMAL HIGH (ref 0–44)
AST: 581 U/L — ABNORMAL HIGH (ref 15–41)
Albumin: 1.8 g/dL — ABNORMAL LOW (ref 3.5–5.0)
Alkaline Phosphatase: 99 U/L (ref 38–126)
Anion gap: 19 — ABNORMAL HIGH (ref 5–15)
BUN: 53 mg/dL — ABNORMAL HIGH (ref 6–20)
CO2: 20 mmol/L — ABNORMAL LOW (ref 22–32)
Calcium: 7.5 mg/dL — ABNORMAL LOW (ref 8.9–10.3)
Chloride: 92 mmol/L — ABNORMAL LOW (ref 98–111)
Creatinine, Ser: 6.68 mg/dL — ABNORMAL HIGH (ref 0.44–1.00)
GFR, Estimated: 8 mL/min — ABNORMAL LOW (ref >60)
Glucose, Bld: 153 mg/dL — ABNORMAL HIGH (ref 70–99)
Potassium: 4.2 mmol/L (ref 3.5–5.1)
Sodium: 131 mmol/L — ABNORMAL LOW (ref 135–145)
Total Bilirubin: 1.5 mg/dL — ABNORMAL HIGH (ref 0.3–1.2)
Total Protein: 7.5 g/dL (ref 6.5–8.1)

## 2021-09-23 LAB — CBC WITH DIFFERENTIAL/PLATELET
Abs Immature Granulocytes: 0.15 10*3/uL — ABNORMAL HIGH (ref 0.00–0.07)
Basophils Absolute: 0.1 10*3/uL (ref 0.0–0.1)
Basophils Relative: 0 %
Eosinophils Absolute: 0.2 10*3/uL (ref 0.0–0.5)
Eosinophils Relative: 1 %
HCT: 29 % — ABNORMAL LOW (ref 36.0–46.0)
Hemoglobin: 8.7 g/dL — ABNORMAL LOW (ref 12.0–15.0)
Immature Granulocytes: 1 %
Lymphocytes Relative: 8 %
Lymphs Abs: 1.2 10*3/uL (ref 0.7–4.0)
MCH: 27.9 pg (ref 26.0–34.0)
MCHC: 30 g/dL (ref 30.0–36.0)
MCV: 92.9 fL (ref 80.0–100.0)
Monocytes Absolute: 1 10*3/uL (ref 0.1–1.0)
Monocytes Relative: 6 %
Neutro Abs: 13.1 10*3/uL — ABNORMAL HIGH (ref 1.7–7.7)
Neutrophils Relative %: 84 %
Platelets: 331 10*3/uL (ref 150–400)
RBC: 3.12 MIL/uL — ABNORMAL LOW (ref 3.87–5.11)
RDW: 26.3 % — ABNORMAL HIGH (ref 11.5–15.5)
Smear Review: ADEQUATE
WBC: 15.7 10*3/uL — ABNORMAL HIGH (ref 4.0–10.5)
nRBC: 2 % — ABNORMAL HIGH (ref 0.0–0.2)

## 2021-09-23 LAB — GLUCOSE, CAPILLARY
Glucose-Capillary: 141 mg/dL — ABNORMAL HIGH (ref 70–99)
Glucose-Capillary: 146 mg/dL — ABNORMAL HIGH (ref 70–99)
Glucose-Capillary: 268 mg/dL — ABNORMAL HIGH (ref 70–99)
Glucose-Capillary: 254 mg/dL — ABNORMAL HIGH (ref 70–99)

## 2021-09-23 LAB — ECHOCARDIOGRAM COMPLETE BUBBLE STUDY
Area-P 1/2: 2.54 cm2
S' Lateral: 2 cm

## 2021-09-23 LAB — MAGNESIUM: Magnesium: 2.3 mg/dL (ref 1.7–2.4)

## 2021-09-23 LAB — POTASSIUM: Potassium: 4.5 mmol/L (ref 3.5–5.1)

## 2021-09-23 LAB — PHOSPHORUS: Phosphorus: 5.9 mg/dL — ABNORMAL HIGH (ref 2.5–4.6)

## 2021-09-23 MED ORDER — INSULIN ASPART 100 UNIT/ML IJ SOLN
0.0000 [IU] | INTRAMUSCULAR | Status: DC
  Administered 2021-09-23 (×2): 3 [IU] via SUBCUTANEOUS

## 2021-09-23 MED ORDER — ALBUMIN HUMAN 25 % IV SOLN
INTRAVENOUS | Status: AC
  Administered 2021-09-23: 12.5 g via ARTERIOVENOUS_FISTULA
  Filled 2021-09-23: qty 50

## 2021-09-23 MED ORDER — CALCITRIOL 0.25 MCG PO CAPS
0.2500 ug | ORAL_CAPSULE | Freq: Two times a day (BID) | ORAL | Status: DC
  Administered 2021-09-23 – 2021-09-28 (×9): 0.25 ug
  Filled 2021-09-23 (×12): qty 1

## 2021-09-23 MED ORDER — SEVELAMER CARBONATE 0.8 G PO PACK
1.6000 g | PACK | Freq: Three times a day (TID) | ORAL | Status: DC
  Administered 2021-09-24 – 2021-10-01 (×16): 1.6 g
  Filled 2021-09-23 (×23): qty 2

## 2021-09-23 MED ORDER — PANTOPRAZOLE 2 MG/ML SUSPENSION
40.0000 mg | Freq: Every day | ORAL | Status: DC
  Administered 2021-09-24 – 2021-10-01 (×7): 40 mg
  Filled 2021-09-23 (×7): qty 20

## 2021-09-23 MED ORDER — ALTEPLASE 2 MG IJ SOLR
2.0000 mg | Freq: Once | INTRAMUSCULAR | Status: DC | PRN
  Filled 2021-09-23: qty 2

## 2021-09-23 MED ORDER — LIDOCAINE-PRILOCAINE 2.5-2.5 % EX CREA: 1.0000 "application " | TOPICAL_CREAM | CUTANEOUS | Status: DC | PRN

## 2021-09-23 MED ORDER — PENTAFLUOROPROP-TETRAFLUOROETH EX AERO
1.0000 "application " | INHALATION_SPRAY | CUTANEOUS | Status: DC | PRN
  Filled 2021-09-23: qty 116

## 2021-09-23 MED ORDER — NAPROXEN 250 MG PO TABS
250.0000 mg | ORAL_TABLET | Freq: Two times a day (BID) | ORAL | Status: DC
  Administered 2021-09-24 – 2021-09-27 (×7): 250 mg
  Filled 2021-09-23 (×8): qty 1

## 2021-09-23 MED ORDER — NEPRO/CARBSTEADY PO LIQD
237.0000 mL | Freq: Two times a day (BID) | ORAL | Status: DC
  Administered 2021-09-27: 237 mL

## 2021-09-23 MED ORDER — TRAMADOL HCL 50 MG PO TABS
100.0000 mg | ORAL_TABLET | Freq: Two times a day (BID) | ORAL | Status: DC
  Administered 2021-09-23 – 2021-09-27 (×7): 100 mg
  Filled 2021-09-23 (×6): qty 2

## 2021-09-23 MED ORDER — RENA-VITE PO TABS
1.0000 | ORAL_TABLET | Freq: Every day | ORAL | Status: DC
  Administered 2021-09-24 – 2021-10-01 (×7): 1
  Filled 2021-09-23 (×7): qty 1

## 2021-09-23 MED ORDER — HEPARIN SODIUM (PORCINE) 1000 UNIT/ML DIALYSIS
1000.0000 [IU] | INTRAMUSCULAR | Status: DC | PRN
  Administered 2021-10-11: 1000 [IU] via INTRAVENOUS_CENTRAL
  Filled 2021-09-23 (×4): qty 1

## 2021-09-23 MED ORDER — SODIUM CHLORIDE 0.9 % IV SOLN: 100.0000 mL | INTRAVENOUS | Status: DC | PRN

## 2021-09-23 MED ORDER — PROSOURCE TF PO LIQD: 45.0000 mL | Freq: Two times a day (BID) | ORAL | Status: DC

## 2021-09-23 MED ORDER — HEPARIN SODIUM (PORCINE) 1000 UNIT/ML DIALYSIS
20.0000 [IU]/kg | INTRAMUSCULAR | Status: DC | PRN
  Administered 2021-09-23: 09:00:00 1100 [IU] via INTRAVENOUS_CENTRAL
  Filled 2021-09-23 (×2): qty 2

## 2021-09-23 MED ORDER — VITAL HIGH PROTEIN PO LIQD: 1000.0000 mL | ORAL | Status: DC

## 2021-09-23 MED ORDER — PROSOURCE TF PO LIQD
45.0000 mL | Freq: Two times a day (BID) | ORAL | Status: DC
  Administered 2021-09-23 – 2021-09-26 (×6): 45 mL
  Filled 2021-09-23 (×6): qty 45

## 2021-09-23 MED ORDER — FREE WATER
30.0000 mL | Freq: Four times a day (QID) | Status: DC
  Administered 2021-09-23 – 2021-10-01 (×25): 30 mL

## 2021-09-23 MED ORDER — NEPRO/CARBSTEADY PO LIQD
1000.0000 mL | ORAL | Status: DC
  Administered 2021-09-23 – 2021-09-24 (×3): 1000 mL
  Filled 2021-09-23 (×3): qty 1000

## 2021-09-23 MED ORDER — LIDOCAINE HCL (PF) 1 % IJ SOLN
5.0000 mL | INTRAMUSCULAR | Status: DC | PRN
  Filled 2021-09-23: qty 5

## 2021-09-23 MED ORDER — MIDODRINE HCL 5 MG PO TABS: 10.0000 mg | ORAL_TABLET | ORAL | Status: DC

## 2021-09-23 NOTE — Progress Notes (Addendum)
Nutrition Follow-up RD working remotely.  DOCUMENTATION CODES:   Not applicable  INTERVENTION:  - small bore NGT placement by nursing staff. - once placement confirmed, initiate Nepro @ 20 ml/hr and advance by 10 ml every 12 hours to reach goal rate of 40 ml/hr with 45 ml Prosource TF BID and 30 ml free water QID. - at goal rate, this regimen will provide 1808 kcal, 100 grams protein, and 818 ml free water. - continue oral diet and Nepro Shake po BID.   NUTRITION DIAGNOSIS:   Increased nutrient needs related to wound healing, chronic illness (ESRD on HD) as evidenced by estimated needs. -ongoing  GOAL:   Patient will meet greater than or equal to 90% of their needs  MONITOR:   TF tolerance, PO intake, Labs, Weight trends, Skin  REASON FOR ASSESSMENT:   Consult Assessment of nutrition requirement/status, Enteral/tube feeding initiation and management  ASSESSMENT:   41 y.o. 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 In ED, imaging suggested osteomyelitis of the right foot.  Follow-up assessment completed by another RD on 2/10 with note at 1042.  Able to talk with CCM MD via secure chat this AM and plan is for small bore NGT placement at bedside and initiation of TF after tube placement confirmed d/t patient with persistent poor PO intake and limited alertness/current mentation. She is currently noted to be a/o to self only (flow sheet documentation).   Patient has had several instances of being NPO this admission but has otherwise been ordered current diet order of Renal, 1.2 L fluid restriction.   The most recently documented meal completion percentages were 0% of dinner on 2/6, 0% of breakfast on 2/7, 75% of lunch on 2/8, and 0% of breakfast on 2/10.  Weight has been stable since 2/7. Moderate pitting edema to RUE, mild pitting edema to  LUE and RLE, and non-pitting edema to LLE documented in the edema section of flow sheet. She is noted to be +82 ml since admission. No urine output documented yesterday or today.    Labs reviewed; CBGs: 141 and 146 mg/dl, Na: 131 mmol/l, Cl: 92 mmol/l, BUN: 53 mg/dl, creatinine: 6.68 mg/dl, Ca: 7.5 mg/dl, Mg WDL, K WDL, Phos last checked 2/9 and was WDL, GFR: 8 ml/min.  Medications reviewed; 90 mg sensipar/day, 100 mg colace BID, sliding scale novolog, 1 g IV Mg sulfate x1 run 2/10, 1 tablet rena-vit/day, 40 mg oral protonix/day, 1.6 g renvela TID.  NUTRITION - FOCUSED PHYSICAL EXAM:  RD working remotely.  Diet Order:   Diet Order             Diet renal with fluid restriction Fluid restriction: 1200 mL Fluid; Room service appropriate? Yes; Fluid consistency: Thin  Diet effective now                   EDUCATION NEEDS:   Not appropriate for education at this time  Skin:  Skin Assessment: Skin Integrity Issues: Skin Integrity Issues:: Incisions Incisions: BKA right leg  Last BM:  2/10 (type 6, small amount)  Height:   Ht Readings from Last 1 Encounters:  09/17/21 5\' 1"  (1.549 m)    Weight:   Wt Readings from Last 1 Encounters:  09/23/21 57 kg    Ideal Body Weight:  44.9 kg (adjusted by 5.9% for BKA)  BMI:  Body mass index is  23.74 kg/m.   Estimated Nutritional Needs:  Kcal:  1800-2000 kcal/d Protein:  90-100 g/d Fluid:  1L+UOP      Jarome Matin, MS, RD, LDN Inpatient Clinical Dietitian RD pager # available in [place]  After hours/weekend pager # available in [facility]

## 2021-09-23 NOTE — Plan of Care (Signed)
°  Problem: Elimination: Goal: Will not experience complications related to bowel motility Outcome: Not Applicable   Problem: Pain Managment: Goal: General experience of comfort will improve Outcome: Not Progressing   Problem: Safety: Goal: Ability to remain free from injury will improve Outcome: Progressing   Problem: Skin Integrity: Goal: Risk for impaired skin integrity will decrease Outcome: Not Progressing

## 2021-09-23 NOTE — Progress Notes (Signed)
Called into room by patients' husband, [name]. Patient is now talking, answering questions, and following commands. Patient is hard of hearing and does say "huh" a lot. Elink, Dr. Loanne Drilling, and Dr. Brendolyn Patty made aware. Will continue to monitor patient.

## 2021-09-23 NOTE — Progress Notes (Signed)
NAME:  [name], MRN:  [id number], DOB:  [birth year], LOS: 8 ADMISSION DATE:  09/15/2021, CONSULTATION DATE:  09/22/21 REFERRING MD:  [place] Crane - TR[facility], CHIEF COMPLAINT:  AMS   History of Present Illness:  41 yo F PMH ESRD on TTS HD, DM2, Neuropathy, HTN, medical non-compliance presented, gangrene RLE to ED 09/15/21 from HD after pt became altered after HD session. Admitted to [facility] for further care. Imaging of R foot obtained which were concerning for osteomyelitis. VVS consulted and pt underwent R BKA 2/5.  Underwent HD 2/9. Volume removal was limited by hypotension. Midodrine added, given albumin  On 2/10 AM pt was more lethargic. Rapid response was called as pt was reportedly minimally responsive to sternal rub. CT H obtained which did not reveal acute intracranial abnormality.    PCCM consulted for AMS in this setting   Pertinent  Medical History  ESRD MSSA bacteremia Medical non-compliance HTN DM2 Neuropathy Gangrene / Osteomyelitis   Significant Hospital Events: Including procedures, antibiotic start and stop dates in addition to other pertinent events   2/3 admit to [facility] 2/5 OR for F BKA 2/2 osteo 2/9 hypotensive with HD lijmiting UF, rcvd albumin and started midodrine 2/10 lethargic. CT H no acute changes. PCCM consulted   Interim History / Subjective:  No overnight issues. On two mcg nor epi this morning with SBP in the 150s. Following commands for me and refusing food, denying pain  Objective   Blood pressure (!) 117/49, pulse 79, temperature 98 F (36.7 C), temperature source Axillary, resp. rate 17, height 5\' 1"  (1.549 m), weight 57 kg, SpO2 96 %.        Intake/Output Summary (Last 24 hours) at 09/23/2021 1043 Last data filed at 09/23/2021 0800 Gross per 24 hour  Intake 308.98 ml  Output 0 ml  Net 308.98 ml   Filed Weights   09/21/21 1155 09/23/21 0432 09/23/21 0900  Weight: 57.1 kg 57 kg 57 kg    Examination: Gen:      Acutely and chronically ill appearing, frail  and cachectic.  HEENT:  dry mucus membranes +thrush Lungs:    ctab no wheezes or crackles CV:         RRR systolic murmur Abd:      + bowel sounds; soft, non-tender; no palpable masses, no distension Ext:   Right BKA amputation, left lower extremity with chronic venous stasis, RUE AVF +thrill and bruit Neuro:   RASS -1. Follows commands such as "thumbs up" intermittently. Denies pain and declines offer for food. Moves all 4 extremities purposefully, decreased sensation in lower extremity.   MRI brain reviewed - scattered punctate infarcts and possible left subacute infarct with chronic changes   Resolved Hospital Problem list     Assessment & Plan:   Acute metabolic encephalopathy  Acute embolic stroke Watershed infarct -TEE ordered for Monday to evaluate for valvular vegetation - micro data reviewed and cultures negative.  - discussed with neuro goal SBP>120 will titrate down pressors as tolerated  Septic Shock - hypotension refractory to fluid resuscitation - leukocytosis with left shift - briefly on pressors overnight - continue empiric vancomycin and meropenem for now although cultures have been negative there is concern for endocarditis, TEE pending  ESRD Getting HD today   Anemia of chronic disease - Hgb 8.6 and stable, transfuse for goal >7  DM2 RLE osteomyelitis s/p R BKA - poorly controlled, A1C 10.4% - continue SSI and short acting while oral intake reduced  Malnutrition -will need nutrition  consult, risk for refeeding syndrome  Physical debility  - will need PT/OT prior to discharge  Best Practice (right click and "Reselect all SmartList Selections" daily)   Diet/type: NPO - will advance as tolerated.  DVT prophylaxis: prophylactic heparin  GI prophylaxis: PPI Lines: N/A Foley:  N/A Code Status:  full code Last date of multidisciplinary goals of care discussion [will update husband - they are separated based on documentation.]   The patient is  critically ill due to encephalopathy, stroke, shock.  Critical care was necessary to treat or prevent imminent or life-threatening deterioration.  Critical care was time spent personally by me on the following activities: development of treatment plan with patient and/or surrogate as well as nursing, discussions with consultants, evaluation of patient's response to treatment, examination of patient, obtaining history from patient or surrogate, ordering and performing treatments and interventions, ordering and review of laboratory studies, ordering and review of radiographic studies, pulse oximetry, re-evaluation of patient's condition and participation in multidisciplinary rounds.   Critical Care Time devoted to patient care services described in this note is 42 minutes. This time reflects time of care of this [place] . This critical care time does not reflect separately billable procedures or procedure time, teaching time or supervisory time of PA/NP/Med student/Med Resident etc but could involve care discussion time.       Spero Geralds [facility] 09/23/2021 10:56 AM  Pager: see AMION  If no response to pager , please call critical care on call (see AMION) until 7pm After 7:00 pm call Elink     Labs   CBC: Recent Labs  Lab 09/18/21 0136 09/19/21 0332 09/21/21 1354 09/22/21 1342 09/23/21 0300  WBC 15.0* 16.5* 15.3* 14.5* 15.7*  NEUTROABS  --   --  12.5* 11.6* 13.1*  HGB 8.5* 8.4* 8.2* 8.3* 8.7*  HCT 28.7* 28.7* 27.0* 27.1* 29.0*  MCV 90.5 91.1 89.1 90.9 92.9  PLT 322 254 245 252 245    Basic Metabolic Panel: Recent Labs  Lab 09/18/21 0136 09/18/21 0711 09/19/21 0332 09/19/21 1347 09/21/21 1354 09/21/21 1855 09/22/21 1555 09/22/21 2008 09/23/21 0300 09/23/21 0734  NA 127*  --  134*  --  132*  --  133*  --  131*  --   K 6.8*   < > 4.6   < > 3.3* 3.5 4.4 3.9 4.2 4.5  CL 91*  --  96*  --  93*  --  92*  --  92*  --   CO2 22   --  22  --  23  --  23  --  20*  --   GLUCOSE 451*  --  216*  --  93  --  82  --  153*  --   BUN 54*  --  72*  --  28*  --  44*  --  53*  --   CREATININE 6.15*  --  7.87*  --  3.99*  --  5.93*  --  6.68*  --   CALCIUM 7.4*  --  7.7*  --  7.8*  --  7.5*  --  7.5*  --   MG  --   --   --   --  1.9  --   --   --  2.3  --   PHOS 7.4*  --   --   --  3.1  --   --   --   --   --    < > =  values in this interval not displayed.   GFR: Estimated Creatinine Clearance: 8.5 mL/min (A) (by C-G formula based on SCr of 6.68 mg/dL (H)). Recent Labs  Lab 09/19/21 0332 09/21/21 1354 09/22/21 1336 09/22/21 1342 09/22/21 1555 09/22/21 2008 09/23/21 0300  PROCALCITON  --   --   --   --  17.01  --   --   WBC 16.5* 15.3*  --  14.5*  --   --  15.7*  LATICACIDVEN  --   --  1.4  --  1.6 0.9  --     Liver Function Tests: Recent Labs  Lab 09/21/21 1354 09/22/21 1555 09/23/21 0300  AST 95* 865* 581*  ALT 14 120* 108*  ALKPHOS 88 102 99  BILITOT 1.0 1.4* 1.5*  PROT 7.7 8.1 7.5  ALBUMIN 2.2* 2.0* 1.8*   No results for input(s): LIPASE, AMYLASE in the last 168 hours. Recent Labs  Lab 09/21/21 1354  AMMONIA 17    ABG    Component Value Date/Time   PHART 7.440 09/22/2021 1134   PCO2ART 34.8 09/22/2021 1134   PO2ART 63.6 (L) 09/22/2021 1134   HCO3 23.3 09/22/2021 1134   TCO2 30 06/30/2020 0237   ACIDBASEDEF 0.4 09/22/2021 1134   O2SAT 91.9 09/22/2021 1134     Coagulation Profile: No results for input(s): INR, PROTIME in the last 168 hours.  Cardiac Enzymes: No results for input(s): CKTOTAL, CKMB, CKMBINDEX, TROPONINI in the last 168 hours.  HbA1C: Hemoglobin A1C  Date/Time Value Ref Range Status  09/12/2020 10:22 AM 10.3 (A) 4.0 - 5.6 % Final   Hgb A1c MFr Bld  Date/Time Value Ref Range Status  08/11/2021 04:15 AM 10.4 (H) 4.8 - 5.6 % Final    Comment:    (NOTE) Pre diabetes:          5.7%-6.4%  Diabetes:              >6.4%  Glycemic control for   <7.0% adults with diabetes    06/30/2020 11:06 AM 9.9 (H) 4.8 - 5.6 % Final    Comment:    (NOTE) Pre diabetes:          5.7%-6.4%  Diabetes:              >6.4%  Glycemic control for   <7.0% adults with diabetes

## 2021-09-23 NOTE — Progress Notes (Signed)
°  Echocardiogram 2D Echocardiogram has been performed.  Johny Chess 09/23/2021, 9:54 AM

## 2021-09-23 NOTE — Progress Notes (Signed)
[facility] Progress Note   Subjective:   had hypotensive episode w/ AMS yesterday and sent to ICU. Head CT negative. Much better today, weaned off pressors and alert and responsive. MRI showed multiple punctate lesions, possibly watershed infarct vs embolic. TEE ordered for Monday. Keep SBP >120. Getting IV abx vanc/ IV mero.   Objective Vitals:   09/23/21 1130 09/23/21 1145 09/23/21 1200 09/23/21 1215  BP: (!) 115/44 (!) 116/42 (!) 117/43 (!) 113/41  Pulse: 72 69 69 69  Resp: 18 17 18 18   Temp:      TempSrc:      SpO2:      Weight:      Height:       Physical Exam General:chronically ill appearing, responsive female in NAD Heart:RRR, no mrg Lungs:CTAB anterolaterally, nml WOB Abdomen:soft, NTND Extremities: no LLE edema, R BKA  Dialysis Access: RU AVF+ bruit    Dialysis Orders: [facility] TTS 3h 46min   400/ 600   59.5kg   2/2 bath  Prof 2   RU AV fistula  Hep 4000  Mircera 200 mcg IV every 2 weeks - last 1/26 Ancef 2 g 3 times weekly HD   Assessment/Plan: Septic shock - did not respond to IVF's, on pressors shortly overnight. Started on IV vanc / meropenem 2/10 yesterday.  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 TEE planned for Monday. Getting IV abx vanc/ mero even though prior blood cx's were negative.  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 Trula Slade.  Plans to d/c to SNF ESRD - on HD TTS.  HD today. Keep SBP > 120.  Anemia of CKD- Hgb 8.2.  Aranesp 200 with HD on 2/9.  Transfuse pRBC prn  Secondary hyperparathyroidism - Corrected Ca ok, phos elevated.  Calcitriol 0.mcg qd started. On renvela and sensipar 90mg  qd.   HTN/volume - Blood pressure low normal today, off pressors. Added midodrine with HD. 2kg under which is just right after BKA surgery.  Severe protein calorie malnutrition - Alb 2s.  Cont protein supplements.  Renal diet w/fluid restrictions.  DMT2 - per PMD  Dispo - dc to SNF  Kelly Splinter, MD 09/23/2021,  12:30 PM     Filed Weights   09/21/21 1155 09/23/21 0432 09/23/21 0900  Weight: 57.1 kg 57 kg 57 kg    Intake/Output Summary (Last 24 hours) at 09/23/2021 1226 Last data filed at 09/23/2021 0800 Gross per 24 hour  Intake 308.98 ml  Output 0 ml  Net 308.98 ml     Labs: Basic Metabolic Panel: Recent Labs  Lab 09/18/21 0136 09/18/21 0711 09/21/21 1354 09/21/21 1855 09/22/21 1555 09/22/21 2008 09/23/21 0300 09/23/21 0734  NA 127*   < > 132*  --  133*  --  131*  --   K 6.8*   < > 3.3*   < > 4.4 3.9 4.2 4.5  CL 91*   < > 93*  --  92*  --  92*  --   CO2 22   < > 23  --  23  --  20*  --   GLUCOSE 451*   < > 93  --  82  --  153*  --   BUN 54*   < > 28*  --  44*  --  53*  --   CREATININE 6.15*   < > 3.99*  --  5.93*  --  6.68*  --   CALCIUM 7.4*   < >  7.8*  --  7.5*  --  7.5*  --   PHOS 7.4*  --  3.1  --   --   --   --   --    < > = values in this interval not displayed.      CBC: Recent Labs  Lab 09/18/21 0136 09/19/21 0332 09/21/21 1354 09/22/21 1342 09/23/21 0300  WBC 15.0* 16.5* 15.3* 14.5* 15.7*  NEUTROABS  --   --  12.5* 11.6* 13.1*  HGB 8.5* 8.4* 8.2* 8.3* 8.7*  HCT 28.7* 28.7* 27.0* 27.1* 29.0*  MCV 90.5 91.1 89.1 90.9 92.9  PLT 322 254 245 252 331     Medications:  [START ON 09/24/2021] sodium chloride     [START ON 09/24/2021] sodium chloride     sodium chloride     sodium chloride     magnesium sulfate bolus IVPB     meropenem (MERREM) IV     norepinephrine (LEVOPHED) Adult infusion 2 mcg/min (09/23/21 0800)   vancomycin      aspirin  300 mg Rectal Daily   calcitRIOL  0.25 mcg Oral BID   Chlorhexidine Gluconate Cloth  6 each Topical Q0600   cinacalcet  90 mg Oral Q supper   darbepoetin (ARANESP) injection - DIALYSIS  200 mcg Intravenous Q Thu-HD   dextrose  25 mL Intravenous Once   docusate sodium  100 mg Oral Daily   dorzolamide-timolol  1 drop Left Eye BID   feeding supplement (NEPRO CARB STEADY)  1,000 mL Per Tube Q24H   feeding  supplement (NEPRO CARB STEADY)  237 mL Oral BID BM   feeding supplement (PROSource TF)  45 mL Per Tube BID   free water  30 mL Per Tube Q6H   heparin injection (subcutaneous)  5,000 Units Subcutaneous Q8H   insulin aspart  0-5 Units Subcutaneous QHS   insulin aspart  0-6 Units Subcutaneous TID WC   lidocaine-prilocaine  1 application Topical UD   mouth rinse  15 mL Mouth Rinse BID   midodrine  10 mg Oral Q T,Th,Sa-HD   multivitamin  1 tablet Oral Daily   naproxen  250 mg Oral BID WC   pantoprazole  40 mg Oral Daily   sevelamer carbonate  1.6 g Oral TID WC   traMADol  100 mg Oral Q12H

## 2021-09-23 NOTE — Progress Notes (Addendum)
STROKE TEAM PROGRESS NOTE   INTERVAL HISTORY Dialysis  running now. Plan for TEE next week. Patient is spontaneously moving upper extremities.  No spontaneous movement or movement to noxious stimuli in the lower extremities.  Patient does not open eyes to voice and speaks only occasional monosyllables but will not follow commands for me.  Apparently she did better for Dr. Shearon Stalls earlier this morning and followed simple midline and one-step commands for her.  Vitals:   09/23/21 0530 09/23/21 0600 09/23/21 0630 09/23/21 0700  BP:      Pulse:      Resp: 11 12 13 13   Temp:      TempSrc:      SpO2:      Weight:      Height:       CBC:  Recent Labs  Lab 09/22/21 1342 09/23/21 0300  WBC 14.5* 15.7*  NEUTROABS 11.6* 13.1*  HGB 8.3* 8.7*  HCT 27.1* 29.0*  MCV 90.9 92.9  PLT 252 440   Basic Metabolic Panel:  Recent Labs  Lab 09/18/21 0136 09/18/21 0711 09/21/21 1354 09/21/21 1855 09/22/21 1555 09/22/21 2008 09/23/21 0300  NA 127*   < > 132*  --  133*  --  131*  K 6.8*   < > 3.3*   < > 4.4 3.9 4.2  CL 91*   < > 93*  --  92*  --  92*  CO2 22   < > 23  --  23  --  20*  GLUCOSE 451*   < > 93  --  82  --  153*  BUN 54*   < > 28*  --  44*  --  53*  CREATININE 6.15*   < > 3.99*  --  5.93*  --  6.68*  CALCIUM 7.4*   < > 7.8*  --  7.5*  --  7.5*  MG  --   --  1.9  --   --   --  2.3  PHOS 7.4*  --  3.1  --   --   --   --    < > = values in this interval not displayed.   Lipid Panel: No results for input(s): CHOL, TRIG, HDL, CHOLHDL, VLDL, LDLCALC in the last 168 hours. HgbA1c: No results for input(s): HGBA1C in the last 168 hours. Urine Drug Screen: No results for input(s): LABOPIA, COCAINSCRNUR, LABBENZ, AMPHETMU, THCU, LABBARB in the last 168 hours.  Alcohol Level No results for input(s): ETH in the last 168 hours.  IMAGING past 24 hours CT ANGIO HEAD NECK W WO CM  Result Date: 09/22/2021 CLINICAL DATA:  Neuro deficit, acute, stroke suspected. Encephalopathy. History of  end-stage renal disease. EXAM: CT ANGIOGRAPHY HEAD AND NECK TECHNIQUE: Multidetector CT imaging of the head and neck was performed using the standard protocol during bolus administration of intravenous contrast. Multiplanar CT image reconstructions and MIPs were obtained to evaluate the vascular anatomy. Carotid stenosis measurements (when applicable) are obtained utilizing NASCET criteria, using the distal internal carotid diameter as the denominator. RADIATION DOSE REDUCTION: This exam was performed according to the departmental dose-optimization program which includes automated exposure control, adjustment of the mA and/or kV according to patient size and/or use of iterative reconstruction technique. CONTRAST:  179mL OMNIPAQUE IOHEXOL 350 MG/ML SOLN COMPARISON:  None. FINDINGS: CTA NECK FINDINGS Aortic arch: Normal variant aortic arch branching pattern with common origin of the brachiocephalic and left common carotid arteries. Minimal atherosclerotic plaque in the aortic arch without arch  vessel origin stenosis. Right carotid system: Patent with minimal calcified plaque at the carotid bifurcation. No evidence of dissection or stenosis. Left carotid system: Patent without evidence of dissection or stenosis. Vertebral arteries: Patent with the left being moderately dominant. Minimal nonstenotic atherosclerotic plaque in the left V1 segment. Focal calcified plaque in the proximal left V2 segment results in mild stenosis. Skeleton: No acute osseous abnormality or suspicious osseous lesion. Other neck: No evidence of cervical lymphadenopathy or mass. Upper chest: Scattered small peribronchovascular densities in both upper lobes. Review of the MIP images confirms the above findings CTA HEAD FINDINGS Anterior circulation: The internal carotid arteries are patent from skull base to carotid termini with calcified plaque resulting in up to mild cavernous stenosis bilaterally as well as a moderate stenosis in the right  paraclinoid region. ACAs and MCAs are patent without evidence of a proximal branch occlusion or significant proximal stenosis. No aneurysm is identified. Posterior circulation: The intracranial vertebral arteries are widely patent to the basilar. Patent PICA, AICA, and SCA origins are seen bilaterally. The basilar artery is widely patent. There are small posterior communicating arteries bilaterally. Both PCAs are patent without evidence of a flow limiting proximal stenosis. There is mild narrowing of the proximal to mid right P2 segment. No aneurysm is identified. Venous sinuses: Patent. Anatomic variants: None. Review of the MIP images confirms the above findings IMPRESSION: 1. No large vessel occlusion. 2. Intracranial atherosclerosis including moderate right and mild left ICA stenoses. 3. Widely patent cervical carotid arteries. 4. Mild left V2 vertebral artery stenosis. 5. Scattered small peribronchovascular pulmonary densities in both upper lobes, likely infectious or inflammatory. 6.  Aortic Atherosclerosis (ICD10-I70.0). Electronically Signed   By: Logan Bores M.D.   On: 09/22/2021 18:56   MR BRAIN WO CONTRAST  Result Date: 09/22/2021 CLINICAL DATA:  Delirium.  Stroke. EXAM: MRI HEAD WITHOUT CONTRAST TECHNIQUE: Multiplanar, multiecho pulse sequences of the brain and surrounding structures were obtained without intravenous contrast. COMPARISON:  Head CT and CTA 09/22/2021.  Head MRI 06/30/2020. FINDINGS: Brain: There are scattered punctate acute infarcts involving white matter greater than cortex in both cerebral hemispheres including both frontal lobes, temporal lobes, and parietal lobes as well as corpus callosum and fornix. More confluent signal abnormality in the left corona radiata with less pronounced diffusion restriction may indicate an early subacute infarct, and there also appear to be underlying chronic lacunar infarcts in the left corona radiata which are new from the prior MRI. Chronic  infarcts are again noted in the body of the corpus callosum with associated wallerian degeneration extending into the white matter of both frontal lobes. There are also unchanged chronic infarcts in the left occipital lobe and inferior left cerebellar hemisphere. A chronic left thalamic lacunar infarct is new from the prior MRI. Additional periventricular white matter T2 hyperintensity is similar to the prior MRI and nonspecific but likely indicative of chronic small vessel ischemia. Several scattered chronic cerebral microhemorrhages are noted. No mass, midline shift, or extra-axial fluid collection is identified. There is mild cerebral and cerebellar atrophy. Vascular: Major intracranial vascular flow voids are preserved. Skull and upper cervical spine: Diffusely diminished bone marrow T1 signal intensity which is nonspecific but likely related to known anemia and chronic renal disease. Sinuses/Orbits: Bilateral cataract extraction. Chronic right retinal detachment. Mild mucosal thickening in the maxillary sinuses. Small left mastoid effusion. Other: None. IMPRESSION: 1. Scattered punctate acute infarcts throughout both cerebral hemispheres which may be embolic. 2. More confluent region of potentially subacute infarction in  the left corona radiata. 3. Chronic ischemia with multiple chronic infarcts as above. Electronically Signed   By: Logan Bores M.D.   On: 09/22/2021 20:04   DG CHEST PORT 1 VIEW  Result Date: 09/22/2021 CLINICAL DATA:  Altered mental status EXAM: PORTABLE CHEST 1 VIEW COMPARISON:  [facility] 05/02/2021 FINDINGS: The heart size and mediastinal contours are within normal limits. Both lungs are clear. The visualized skeletal structures are unremarkable. Age advanced atherosclerotic vascular calcifications are evident. IMPRESSION: No active disease. Electronically Signed   By: Davina Poke D.O.   On: 09/22/2021 10:32   DG Abd Portable 1V  Result Date: 09/22/2021 CLINICAL DATA:  Altered  mental status of unknown origin EXAM: PORTABLE ABDOMEN - 1 VIEW COMPARISON:  None. FINDINGS: The bowel gas pattern is normal. No radio-opaque calculi or other significant radiographic abnormality are seen. Extensive age advanced atherosclerotic vascular calcifications throughout the abdomen and pelvis. IMPRESSION: 1. Nonobstructive bowel gas pattern. 2. Extensive age advanced atherosclerotic vascular calcifications throughout the abdomen and pelvis. Electronically Signed   By: Davina Poke D.O.   On: 09/22/2021 10:35   EEG adult  Result Date: 09/22/2021 Catha Gosselin, DO     09/22/2021  8:55 PM [facility] Routine EEG Report Patient Name:   Lasundra, Hascall Date of Birth:   [birth year] Identification Number:   MRN - [id number] Date of Study:   09/22/2021 12:23:19 Duration: 24 minutes Indication: Encephalopathy Technical Summary: A routine 20 channel electroencephalogram using the international 10-20 system of electrode placement was performed. Background: 5-6 Hz, Poorly formed States : Coma Abnormalities - Generalized Slowing: Diffuse generalized slowing - Background Slowing: The background consists of 20-50 uV, 5-6 Hz diffuse activity with superimposed diffuse polymorphic delta activity that is non reactive to external stimulation. - Focal Slowing: Focal slowing is seen intermittently in the right centrotemporal region. Activation Procedures: Hyperventilation and Photic Stimulation: Not performed Classification: Abnormal Clinical Correlation: This EEG is abnormal due to 1) mild generalized slowing. This is a nonspecific finding but is consistent with a generalized disturbance of cerebral function and may be seen in toxic, metabolic, post anoxic, multifocal or diffuse structural abnormalities. 2) Focal slowing is seen intermittently in the right centrotemporal region. This is a finding that may be consistent with a focal disturbance of cerebral function such as  her recent finding of b/l ischemic events 3) No electrographic seizures were recorded. Clinical correlation is recommended. Dr Annabelle [facility] [phone] Case [id number]   PHYSICAL EXAM GENERAL: 89 African-American lady laying comfortably in hospital bed, in no acute distress, on dialysis LUNGS: Normal respiratory effort. Non-labored breathing on room air CV: Regular rate and rhythm on telemetry Musculoskeletal : Right below-knee amputation NEURO:  Mental Status: drowsy, does not open eyes to stimuli throughout, does not follow commands  She does not speak other than saying "stop" with application of noxious stimuli She does not attempt to name objects or follow commands. Does not answer orientation questions.  Cranial Nerves:  II: Right pupil is nonreactive to direct light, is 3 mm and irregular. The left pupil is 5 mm, irregular, and sluggishly reactive to light.  III, IV, VI: Does not open eyes, does not fixate or track examiner.  Eyes are dysconjugate with mild right eye exotropia and left eye hypertropia. V: Does not blink to threat throughout   VII: Face appears symmetric resting and with movement.  VIII: Unable to assess due to patient's condition IX, X: Does not cough spontaneously, does not allow for visualization  of patient's soft palate XI: Head is grossly midline XII: Does not protrude tongue Motor:  Spontaneous movement bilateral upper extremities against gravity and purposeful.  There is no movement of bilateral lower extremities with application of noxious stimuli. Right BKA.  Tone and bulk are normal.  Sensation: Localizes to noxious stimuli in bilateral upper extremities.  Coordination: Does not perform   Gait: Deferred  ASSESSMENT/PLAN [name] is a 41 y.o. 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  Initial work up in the ED revealed patient with leukocytosis concerning for infection and imaging with concern for osteomyelitis for which vascular surgery recommended admission for BKA on 2/5 and ongoing antibiotics. Of note, patient was discharged in January following urgent transmetatarsal amputation after being found with gangrenous toes s/p wound vac and IV Ancef after wound cultures grew MSSA. During hospitalization, patient's mental status initially improved and she was responding appropriately on evaluation on 2/4 with surgery for BKA on 2/5. On 2/6, she was noted to be somnolent and slow to respond but she was answering questions appropriately. On 2/7 patient had an episode of hypotension with HD but was noted to still be lethargic but followed commands and answered questions appropriately. Patient again had HD on 2/9 and became more lethargic, patient lethargic on exam during HD on 2/11.  Stroke:  scattered punctate acute infarcts throughout both cerebral  hemispheres likely  embolic -in setting of osteomyelitis consideration for bacterial endocarditis or paradoxical embolism CT head No acute abnormality. CTA head & neck - No large vessel occlusion. Intracranial atherosclerosis including moderate right and mild left ICA stenoses. Widely patent cervical carotid arteries. Mild left V2 vertebral artery stenosis. Scattered small peribronchovascular pulmonary densities in both upper lobes, likely infectious or inflammatory MRI - Scattered punctate acute infarcts throughout both cerebral hemispheres which may be embolic. More confluent region of potentially subacute infarction in the left corona radiata. Chronic ischemia with multiple chronic infarcts as above. 2D Echo EF 70-75%, hyperdynamic LV, evidence of atrial level shunt detected by color-flow Doppler (the bubble study positive) EEG- Mild generalized slowing. This is a nonspecific finding but is  consistent with a generalized disturbance of cerebral function and may be seen in toxic, metabolic, post anoxic, multifocal or diffuse structural abnormalities.   Focal slowing is seen intermittently in the right centrotemporal region. This is a finding that may be consistent with a focal disturbance of cerebral function such as her recent finding of b/l ischemic events. No electrographic seizures were recorded.  LDL 97 HgbA1c 10.4 VTE prophylaxis - heparin subq No antithrombotic prior to admission, now on aspirin 300 mg suppository daily.  Therapy recommendations: Pending Disposition: Pending  PFO on echocardiogram Plan for TEE next week LE Venous duplex ordered  Hypertension Home meds:  Losartan Stable BP goal (130-150) Long-term BP goal normotensive Midodrine added due to hypotension  Hyperlipidemia Home meds: None LDL 97, goal < 70 Start statin when medically appropriate  Diabetes type II Uncontrolled Home meds:  INsulin HgbA1c 10.4, goal < 7.0 CBGs SSI  Other Stroke Risk Factors Hx stroke/TIA Coronary artery disease  Other Active Problems Osteomyelitis BKA 2/5  End stage renal disease HD per nephrology  Cr 6.68 Leukocytosis - management by primary team WBC 15.7 Vancomycin, merrem Malnutrition Dietary/nutrition consult    Hospital day # 8  Patient seen and examined by  NP/APP with MD. MD to update note as needed.   Janine Ores, DNP, FNP-BC [facility] Pager: [phone] I have personally obtained history,examined this patient, reviewed notes, independently viewed imaging studies, participated in medical decision making and plan of care.ROS completed by me personally and pertinent positives fully documented  I have made any additions or clarifications directly to the above note. Agree with note above.  Patient presented with altered mental status in the setting of osteomyelitis and brain MRI scan showed multiple bilateral cortical and  subcortical infarcts with strong possibility of cardiogenic embolism.  Recommend check TEE for endocarditis and lower extremity venous Doppler for DVT.  Continue ongoing medical management as per CCM team.  No family available at the bedside for discussion.  Discussed with Dr. Shearon Stalls critical care medicine.This patient is critically ill and at significant risk of neurological worsening, death and care requires constant monitoring of vital signs, hemodynamics,respiratory and cardiac monitoring, extensive review of multiple databases, frequent neurological assessment, discussion with family, other specialists and medical decision making of high complexity.I have made any additions or clarifications directly to the above note.This critical care time does not reflect procedure time, or teaching time or supervisory time of PA/NP/Med Resident etc but could involve care discussion time.  I spent 30 minutes of neurocritical care time  in the care of  this patient.      Antony Contras, MD Medical Director [facility] Pager: [phone] 09/23/2021 1:49 PM  To contact Stroke Continuity provider, please refer to [email or web address] After hours, contact General Neurology

## 2021-09-24 ENCOUNTER — Inpatient Hospital Stay (HOSPITAL_COMMUNITY): Payer: Medicare Other

## 2021-09-24 DIAGNOSIS — I6340 Cerebral infarction due to embolism of unspecified cerebral artery: Secondary | ICD-10-CM | POA: Diagnosis not present

## 2021-09-24 DIAGNOSIS — E43 Unspecified severe protein-calorie malnutrition: Secondary | ICD-10-CM | POA: Diagnosis not present

## 2021-09-24 DIAGNOSIS — G9341 Metabolic encephalopathy: Secondary | ICD-10-CM | POA: Diagnosis not present

## 2021-09-24 DIAGNOSIS — I639 Cerebral infarction, unspecified: Secondary | ICD-10-CM

## 2021-09-24 DIAGNOSIS — N186 End stage renal disease: Secondary | ICD-10-CM | POA: Diagnosis not present

## 2021-09-24 DIAGNOSIS — Z978 Presence of other specified devices: Secondary | ICD-10-CM

## 2021-09-24 LAB — GLUCOSE, CAPILLARY
Glucose-Capillary: 242 mg/dL — ABNORMAL HIGH (ref 70–99)
Glucose-Capillary: 244 mg/dL — ABNORMAL HIGH (ref 70–99)
Glucose-Capillary: 264 mg/dL — ABNORMAL HIGH (ref 70–99)
Glucose-Capillary: 275 mg/dL — ABNORMAL HIGH (ref 70–99)
Glucose-Capillary: 283 mg/dL — ABNORMAL HIGH (ref 70–99)
Glucose-Capillary: 389 mg/dL — ABNORMAL HIGH (ref 70–99)

## 2021-09-24 LAB — COMPREHENSIVE METABOLIC PANEL
ALT: 85 U/L — ABNORMAL HIGH (ref 0–44)
AST: 306 U/L — ABNORMAL HIGH (ref 15–41)
Albumin: 1.8 g/dL — ABNORMAL LOW (ref 3.5–5.0)
Alkaline Phosphatase: 100 U/L (ref 38–126)
Anion gap: 16 — ABNORMAL HIGH (ref 5–15)
BUN: 36 mg/dL — ABNORMAL HIGH (ref 6–20)
CO2: 24 mmol/L (ref 22–32)
Calcium: 7.5 mg/dL — ABNORMAL LOW (ref 8.9–10.3)
Chloride: 93 mmol/L — ABNORMAL LOW (ref 98–111)
Creatinine, Ser: 4.76 mg/dL — ABNORMAL HIGH (ref 0.44–1.00)
GFR, Estimated: 11 mL/min — ABNORMAL LOW (ref >60)
Glucose, Bld: 244 mg/dL — ABNORMAL HIGH (ref 70–99)
Potassium: 3.3 mmol/L — ABNORMAL LOW (ref 3.5–5.1)
Sodium: 133 mmol/L — ABNORMAL LOW (ref 135–145)
Total Bilirubin: 0.9 mg/dL (ref 0.3–1.2)
Total Protein: 7.2 g/dL (ref 6.5–8.1)

## 2021-09-24 LAB — CBC
HCT: 27.6 % — ABNORMAL LOW (ref 36.0–46.0)
Hemoglobin: 7.8 g/dL — ABNORMAL LOW (ref 12.0–15.0)
MCH: 26.7 pg (ref 26.0–34.0)
MCHC: 28.3 g/dL — ABNORMAL LOW (ref 30.0–36.0)
MCV: 94.5 fL (ref 80.0–100.0)
Platelets: 315 10*3/uL (ref 150–400)
RBC: 2.92 MIL/uL — ABNORMAL LOW (ref 3.87–5.11)
RDW: 26.4 % — ABNORMAL HIGH (ref 11.5–15.5)
WBC: 14.6 10*3/uL — ABNORMAL HIGH (ref 4.0–10.5)
nRBC: 3.7 % — ABNORMAL HIGH (ref 0.0–0.2)

## 2021-09-24 LAB — PHOSPHORUS: Phosphorus: 7 mg/dL — ABNORMAL HIGH (ref 2.5–4.6)

## 2021-09-24 LAB — MAGNESIUM: Magnesium: 2.3 mg/dL (ref 1.7–2.4)

## 2021-09-24 MED ORDER — SODIUM CHLORIDE 0.9 % IV SOLN
2.0000 g | Freq: Two times a day (BID) | INTRAVENOUS | Status: DC
  Administered 2021-09-24 – 2021-09-25 (×3): 2 g via INTRAVENOUS
  Filled 2021-09-24 (×5): qty 20

## 2021-09-24 MED ORDER — GUAIFENESIN-DM 100-10 MG/5ML PO SYRP
15.0000 mL | ORAL_SOLUTION | ORAL | Status: DC | PRN
  Administered 2021-09-29 – 2021-10-17 (×4): 15 mL
  Filled 2021-09-24 (×3): qty 15
  Filled 2021-09-24 (×2): qty 20

## 2021-09-24 MED ORDER — DOCUSATE SODIUM 50 MG/5ML PO LIQD
100.0000 mg | Freq: Every day | ORAL | Status: DC
  Administered 2021-09-27: 100 mg

## 2021-09-24 MED ORDER — ASPIRIN 325 MG PO TABS
325.0000 mg | ORAL_TABLET | Freq: Every day | ORAL | Status: DC
  Administered 2021-09-24 – 2021-09-29 (×5): 325 mg via NASOGASTRIC
  Filled 2021-09-24 (×5): qty 1

## 2021-09-24 MED ORDER — OXYCODONE-ACETAMINOPHEN 5-325 MG PO TABS: 1.0000 | ORAL_TABLET | Freq: Four times a day (QID) | ORAL | Status: DC | PRN

## 2021-09-24 MED ORDER — INSULIN ASPART 100 UNIT/ML IJ SOLN
0.0000 [IU] | INTRAMUSCULAR | Status: DC
  Administered 2021-09-24 (×2): 3 [IU] via SUBCUTANEOUS
  Administered 2021-09-24: 2 [IU] via SUBCUTANEOUS
  Administered 2021-09-24: 3 [IU] via SUBCUTANEOUS
  Administered 2021-09-24: 5 [IU] via SUBCUTANEOUS
  Administered 2021-09-24: 2 [IU] via SUBCUTANEOUS
  Administered 2021-09-25 (×4): 3 [IU] via SUBCUTANEOUS
  Administered 2021-09-25: 2 [IU] via SUBCUTANEOUS
  Administered 2021-09-26: 4 [IU] via SUBCUTANEOUS
  Administered 2021-09-26: 2 [IU] via SUBCUTANEOUS
  Administered 2021-09-26: 5 [IU] via SUBCUTANEOUS
  Administered 2021-09-27: 2 [IU] via SUBCUTANEOUS
  Administered 2021-09-27: 1 [IU] via SUBCUTANEOUS
  Administered 2021-09-27: 3 [IU] via SUBCUTANEOUS
  Administered 2021-09-27 – 2021-09-28 (×2): 1 [IU] via SUBCUTANEOUS
  Administered 2021-09-28: 2 [IU] via SUBCUTANEOUS
  Administered 2021-09-29 (×2): 1 [IU] via SUBCUTANEOUS
  Administered 2021-09-29 – 2021-09-30 (×2): 3 [IU] via SUBCUTANEOUS
  Administered 2021-09-30 – 2021-10-01 (×2): 1 [IU] via SUBCUTANEOUS

## 2021-09-24 MED ORDER — ALUM & MAG HYDROXIDE-SIMETH 200-200-20 MG/5ML PO SUSP
15.0000 mL | ORAL | Status: DC | PRN
  Filled 2021-09-24: qty 30

## 2021-09-24 MED ORDER — OXYCODONE-ACETAMINOPHEN 5-325 MG PO TABS
1.0000 | ORAL_TABLET | ORAL | Status: DC | PRN
Start: 2021-09-24 — End: 2021-09-27
  Administered 2021-09-24 – 2021-09-26 (×3): 1
  Filled 2021-09-24 (×4): qty 1

## 2021-09-24 MED ORDER — POTASSIUM CHLORIDE 20 MEQ PO PACK
20.0000 meq | PACK | Freq: Once | ORAL | Status: AC
  Administered 2021-09-24: 20 meq
  Filled 2021-09-24: qty 1

## 2021-09-24 MED ORDER — MIDODRINE HCL 5 MG PO TABS
5.0000 mg | ORAL_TABLET | Freq: Three times a day (TID) | ORAL | Status: DC
  Administered 2021-09-24 – 2021-09-29 (×15): 5 mg
  Filled 2021-09-24 (×15): qty 1

## 2021-09-24 MED ORDER — INSULIN GLARGINE-YFGN 100 UNIT/ML ~~LOC~~ SOLN
10.0000 [IU] | Freq: Every day | SUBCUTANEOUS | Status: DC
  Administered 2021-09-24: 10 [IU] via SUBCUTANEOUS
  Filled 2021-09-24 (×2): qty 0.1

## 2021-09-24 NOTE — Progress Notes (Signed)
Lower extremity venous bilateral study completed.   Please see CV Proc for preliminary results.   Tylan Briguglio, RDMS, RVT  

## 2021-09-24 NOTE — Plan of Care (Signed)
°  Problem: Education: Goal: Knowledge of General Education information will improve Description: Including pain rating scale, medication(s)/side effects and non-pharmacologic comfort measures Outcome: Progressing   Problem: Skin Integrity: Goal: Risk for impaired skin integrity will decrease Outcome: Progressing   Problem: Skin Integrity: Goal: Demonstration of wound healing without infection will improve Outcome: Progressing

## 2021-09-24 NOTE — Progress Notes (Addendum)
NAME:  [name], MRN:  [id number], DOB:  1980-12-11, LOS: 9 ADMISSION DATE:  09/15/2021, CONSULTATION DATE:  09/22/21 REFERRING MD:  [place] Crane - TRH, CHIEF COMPLAINT:  AMS   History of Present Illness:  41 yo F PMH ESRD on TTS HD, DM2, Neuropathy, HTN, medical non-compliance presented, gangrene RLE to ED 09/15/21 from HD after pt became altered after HD session. Admitted to [facility] for further care. Imaging of R foot obtained which were concerning for osteomyelitis. VVS consulted and pt underwent R BKA 2/5.  Underwent HD 2/9. Volume removal was limited by hypotension. Midodrine added, given albumin  On 2/10 AM pt was more lethargic. Rapid response was called as pt was reportedly minimally responsive to sternal rub. CT H obtained which did not reveal acute intracranial abnormality.    PCCM consulted for AMS in this setting   Pertinent  Medical History  ESRD MSSA bacteremia Medical non-compliance HTN DM2 Neuropathy Gangrene / Osteomyelitis   Significant Hospital Events: Including procedures, antibiotic start and stop dates in addition to other pertinent events   2/3 admit to [facility] 2/5 OR for F BKA 2/2 osteo 2/9 hypotensive with HD lijmiting UF, rcvd albumin and started midodrine 2/10 lethargic. CT H no acute changes. PCCM consulted   Interim History / Subjective:  Off pressors. Overnight had some pain and complained about feeding tube. Increased pain meds prn overnight. Copious stool output  Objective   Blood pressure (!) 121/43, pulse 79, temperature (!) 96.5 F (35.8 C), temperature source Axillary, resp. rate 13, height 5\' 1"  (1.549 m), weight 56.7 kg, SpO2 100 %.        Intake/Output Summary (Last 24 hours) at 09/24/2021 6195 Last data filed at 09/24/2021 0700 Gross per 24 hour  Intake 668.06 ml  Output 1000 ml  Net -331.94 ml   Filed Weights   09/23/21 0900 09/23/21 2330 09/24/21 0101  Weight: 57 kg 56.7 kg 56.7 kg    Examination: Acutely ill and chronically ill  appearing Rouses to verbal stimuli but does not consistently follow commands Moves upper extremities spontaneously and purposefully Right BKA RRR systolic murmur Lungs are clear breathing is nonlabored There is stool coming out of the vagina  Na 133 K 3.3 Cr 4.76 Hgb 7.8  Resolved Hospital Problem list   Septic shock  Assessment & Plan:   Acute metabolic encephalopathy  Acute embolic stroke Watershed infarct -TEE ordered for Monday to evaluate for valvular vegetation - micro data reviewed and cultures negative.  - discussed with neuro goal SBP>120 will titrate down pressors as tolerated - has been on  empiric vancomycin and meropenem for now although cultures have been negative there is concern for endocarditis, TEE pending. Will narrow abx to ceftriaxone with dosing for meningitis given negative culture data. Await TEE.   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 He is out of money and is asking for support for meal vouchers or bus fare.]  Discussed with neurology - exam improving. Will transfer out of ICU today.    Tracey Llamas, MD Pulmonary and  [place] 09/24/2021 8:13 AM Pager: see AMION  If no response to pager, please call critical care on call (see AMION) until 7pm After 7:00 pm call Elink       Labs   CBC: Recent Labs  Lab 09/19/21 0332 09/21/21 1354 09/22/21 1342 09/23/21 0300 09/24/21 0257  WBC 16.5* 15.3* 14.5* 15.7* 14.6*  NEUTROABS  --  12.5* 11.6* 13.1*  --   HGB 8.4* 8.2* 8.3* 8.7* 7.8*  HCT 28.7* 27.0* 27.1* 29.0* 27.6*  MCV 91.1 89.1 90.9 92.9 94.5  PLT 254 245 252 331 211    Basic Metabolic Panel: Recent Labs  Lab 09/18/21 0136 09/18/21 0711 09/19/21 0332 09/19/21 1347 09/21/21 1354 09/21/21 1855 09/22/21 1555 09/22/21 2008 09/23/21 0300 09/23/21 0734 09/23/21 1525 09/24/21 0257  NA 127*  --  134*  --  132*  --  133*  --  131*  --   --  133*  K 6.8*   < > 4.6   < > 3.3*   < > 4.4 3.9 4.2 4.5  --  3.3*  CL 91*  --  96*  --  93*  --  92*  --  92*  --   --  93*  CO2 22  --  22  --  23  --  23  --  20*  --   --  24  GLUCOSE 451*  --  216*  --  93  --  82  --  153*  --   --  244*  BUN 54*  --  72*  --  28*  --  44*  --  53*  --   --  36*  CREATININE 6.15*  --  7.87*  --  3.99*  --  5.93*  --  6.68*  --   --  4.76*  CALCIUM 7.4*  --  7.7*  --  7.8*  --  7.5*  --  7.5*  --   --  7.5*  MG  --   --   --   --  1.9  --   --   --  2.3  --   --  2.3  PHOS 7.4*  --   --   --  3.1  --   --   --   --   --  5.9* 7.0*   < > = values in this interval not displayed.   GFR: Estimated Creatinine Clearance: 12 mL/min (A) (by C-G formula based on SCr of 4.76 mg/dL (H)). Recent Labs  Lab 09/21/21 1354 09/22/21 1336 09/22/21 1342 09/22/21 1555 09/22/21 2008 09/23/21 0300 09/24/21 0257  PROCALCITON  --   --   --  17.01  --   --   --   WBC 15.3*  --  14.5*  --   --  15.7* 14.6*  LATICACIDVEN  --  1.4  --  1.6 0.9  --   --     Liver Function Tests: Recent Labs  Lab 09/21/21 1354 09/22/21 1555 09/23/21 0300 09/24/21 0257  AST 95* 865* 581* 306*  ALT 14 120* 108*  85*  ALKPHOS 88 102 99 100  BILITOT 1.0 1.4* 1.5* 0.9  PROT 7.7 8.1 7.5 7.2  ALBUMIN 2.2* 2.0* 1.8* 1.8*   No results for input(s): LIPASE, AMYLASE in the last 168 hours. Recent Labs  Lab 09/21/21 1354  AMMONIA 17    ABG    Component Value Date/Time   PHART 7.440 09/22/2021 1134   PCO2ART 34.8 09/22/2021 1134   PO2ART 63.6 (L) 09/22/2021 1134   HCO3 23.3 09/22/2021 1134   TCO2 30 06/30/2020 0237   ACIDBASEDEF 0.4 09/22/2021 1134   O2SAT 91.9 09/22/2021 1134     Coagulation Profile: No results for input(s): INR, PROTIME in the last 168 hours.  Cardiac Enzymes: No results for input(s): CKTOTAL, CKMB, CKMBINDEX, TROPONINI in the last 168 hours.  HbA1C: Hemoglobin A1C  Date/Time Value Ref Range Status  09/12/2020 10:22 AM 10.3 (A) 4.0 - 5.6 % Final   Hgb A1c MFr Bld  Date/Time Value Ref Range Status  08/11/2021 04:15 AM 10.4 (H) 4.8 - 5.6 % Final    Comment:    (NOTE) Pre diabetes:          5.7%-6.4%  Diabetes:              >6.4%  Glycemic control for   <7.0% adults with diabetes   06/30/2020 11:06 AM 9.9 (H) 4.8 - 5.6 % Final    Comment:    (NOTE) Pre diabetes:          5.7%-6.4%  Diabetes:              >6.4%  Glycemic control for   <7.0% adults with diabetes

## 2021-09-24 NOTE — Progress Notes (Addendum)
eLink Physician-Brief Progress Note Patient Name: [name] DOB: 1980-08-22 MRN: [id number]   Date of Service  09/24/2021  HPI/Events of Note  RN reporting patient with discomfort and pain  eICU Interventions  Change oxycodone from q6h PRN to [facility]   09/24/21 5:55 AM Notified K 3.3. Had dialysis yesterday. Hold on repletion  Intervention Category Minor Interventions: Agitation / anxiety - evaluation and management  Chariti Havel Rodman Pickle 09/24/2021, 5:24 AM

## 2021-09-24 NOTE — Progress Notes (Signed)
STROKE TEAM PROGRESS NOTE   INTERVAL HISTORY  Patient sitting up in bed with eyes closed but is is spontaneously moving upper extremities.  No spontaneous movement  in the lower extremities but will withdraw right foot slightly.  Patient does not open eyes to voice and speaks only occasional short sentences but will   follow some midline and peripheral commands for me.   She once admits to be removed. Lower extremity Dopplers are negative for DVT.  Vital signs stable.  Neurological exam slightly improved Vitals:   09/24/21 1121 09/24/21 1130 09/24/21 1200 09/24/21 1232  BP:    (!) 127/37  Pulse:  75 75 72  Resp:  12 (!) 6 11  Temp: (!) 96.6 F (35.9 C)     TempSrc: Axillary     SpO2:  100% 100% 100%  Weight:      Height:       CBC:  Recent Labs  Lab 09/22/21 1342 09/23/21 0300 09/24/21 0257  WBC 14.5* 15.7* 14.6*  NEUTROABS 11.6* 13.1*  --   HGB 8.3* 8.7* 7.8*  HCT 27.1* 29.0* 27.6*  MCV 90.9 92.9 94.5  PLT 252 331 329   Basic Metabolic Panel:  Recent Labs  Lab 09/23/21 0300 09/23/21 0734 09/23/21 1525 09/24/21 0257  NA 131*  --   --  133*  K 4.2 4.5  --  3.3*  CL 92*  --   --  93*  CO2 20*  --   --  24  GLUCOSE 153*  --   --  244*  BUN 53*  --   --  36*  CREATININE 6.68*  --   --  4.76*  CALCIUM 7.5*  --   --  7.5*  MG 2.3  --   --  2.3  PHOS  --   --  5.9* 7.0*   Lipid Panel: No results for input(s): CHOL, TRIG, HDL, CHOLHDL, VLDL, LDLCALC in the last 168 hours. HgbA1c: No results for input(s): HGBA1C in the last 168 hours. Urine Drug Screen: No results for input(s): LABOPIA, COCAINSCRNUR, LABBENZ, AMPHETMU, THCU, LABBARB in the last 168 hours.  Alcohol Level No results for input(s): ETH in the last 168 hours.  IMAGING past 24 hours DG Abd Portable 1V  Result Date: 09/23/2021 CLINICAL DATA:  NG tube placement EXAM: PORTABLE ABDOMEN - 1 VIEW COMPARISON:  09/22/2021 FINDINGS: Enteric tube terminates in the distal gastric body. IMPRESSION: Enteric tube  terminates in the distal gastric body. Electronically Signed   By: Julian Hy M.D.   On: 09/23/2021 19:59   DG Abd Portable 1V  Result Date: 09/23/2021 CLINICAL DATA:  Encounter for NG tube placement. EXAM: PORTABLE ABDOMEN - 1 VIEW COMPARISON:  Earlier today FINDINGS: NG tube tip and side port are well below the level of the GE junction. The tip is in the expected location of the distal body of stomach. IMPRESSION: Satisfactory position of nasogastric tube with tip in the distal body of the stomach. Electronically Signed   By: Kerby Moors M.D.   On: 09/23/2021 17:36   VAS [place] LOWER EXTREMITY VENOUS (DVT)  Result Date: 09/24/2021  Lower Venous DVT Study Patient Name:  PAISLEA HATTON  Date of Exam:   09/24/2021 Medical Rec #: [id number]      Accession #:    [id number] Date of Birth: 1980-10-07       Patient Gender: F Patient Age:   41 years Exam Location:  [facility] Procedure:      VAS  [place] LOWER EXTREMITY VENOUS (DVT) Referring Phys: DEVON SHAFER --------------------------------------------------------------------------------  Indications: Stroke, positive bubble study. Other Indications: S/P right BKA. Comparison Study: No prior studies. Performing Technologist: Darlin Coco RDMS, RVT  Examination Guidelines: A complete evaluation includes B-mode imaging, spectral Doppler, color Doppler, and power Doppler as needed of all accessible portions of each vessel. Bilateral testing is considered an integral part of a complete examination. Limited examinations for reoccurring indications may be performed as noted. The reflux portion of the exam is performed with the patient in reverse Trendelenburg.  +---------+---------------+---------+-----------+----------+--------------+  RIGHT     Compressibility Phasicity Spontaneity Properties Thrombus Aging  +---------+---------------+---------+-----------+----------+--------------+  CFV       Full            Yes       Yes                                     +---------+---------------+---------+-----------+----------+--------------+  SFJ       Full                                                             +---------+---------------+---------+-----------+----------+--------------+  FV Prox   Full                                                             +---------+---------------+---------+-----------+----------+--------------+  FV Mid    Full                                                             +---------+---------------+---------+-----------+----------+--------------+  FV Distal Full                                                             +---------+---------------+---------+-----------+----------+--------------+  PFV       Full                                                             +---------+---------------+---------+-----------+----------+--------------+  POP       Full                                                             +---------+---------------+---------+-----------+----------+--------------+  PTV  BKA             +---------+---------------+---------+-----------+----------+--------------+  PERO                                                       BKA             +---------+---------------+---------+-----------+----------+--------------+   +---------+---------------+---------+-----------+----------+--------------+  LEFT      Compressibility Phasicity Spontaneity Properties Thrombus Aging  +---------+---------------+---------+-----------+----------+--------------+  CFV       Full            Yes       Yes                                    +---------+---------------+---------+-----------+----------+--------------+  SFJ       Full                                                             +---------+---------------+---------+-----------+----------+--------------+  FV Prox   Full                                                              +---------+---------------+---------+-----------+----------+--------------+  FV Mid    Full                                                             +---------+---------------+---------+-----------+----------+--------------+  FV Distal Full                                                             +---------+---------------+---------+-----------+----------+--------------+  PFV       Full                                                             +---------+---------------+---------+-----------+----------+--------------+  POP       Full            Yes       Yes                                    +---------+---------------+---------+-----------+----------+--------------+  PTV       Full                                                             +---------+---------------+---------+-----------+----------+--------------+  PERO      Full                                                             +---------+---------------+---------+-----------+----------+--------------+    Summary: RIGHT: - There is no evidence of deep vein thrombosis in the lower extremity.  - No cystic structure found in the popliteal fossa.  LEFT: - There is no evidence of deep vein thrombosis in the lower extremity.  - No cystic structure found in the popliteal fossa.  *See table(s) above for measurements and observations.    Preliminary     PHYSICAL EXAM GENERAL: 62 African-American lady laying comfortably in hospital bed, in no acute distress, on dialysis LUNGS: Normal respiratory effort. Non-labored breathing on room air CV: Regular rate and rhythm on telemetry Musculoskeletal : Right below-knee amputation NEURO:  Mental Status: drowsy, does not open eyes to stimuli throughout, does follow a few commands  She does speak a few words and occasional short sentences she does not attempt to name objects or follow commands. Does not answer orientation questions.  Cranial Nerves:  II: Right pupil is nonreactive to direct light,  is 3 mm and irregular. The left pupil is 5 mm, irregular, and sluggishly reactive to light.  III, IV, VI: Does not open eyes, does not fixate or track examiner.  Eyes are dysconjugate with mild right eye exotropia and left eye hyportropia. V: Does not blink to threat throughout   VII: Face appears symmetric resting and with movement.  VIII: Unable to assess due to patient's condition IX, X: Does not cough spontaneously, does not allow for visualization of patient's soft palate XI: Head is grossly midline XII: Does not protrude tongue Motor:  Spontaneous movement in right upper extremity which is purposeful.  Left upper extremity appears to be weaker today but withdraws to noxious stimuli.  Withdraws right lower extremity noxious stimuli but not left lower extremity.  She is able to maintain tone with left lower extremity when flexed at the hip and.  . Right BKA.  Tone and bulk are normal.  Sensation: Localizes to noxious stimuli in bilateral upper extremities.  Coordination: Does not perform   Gait: Deferred  ASSESSMENT/PLAN Ms. Alanis Clift is a 41 y.o. female with history of ESRD on HD T/Th/Sat, type 2 diabetes mellitus with neuropathy, hypertension, osteomyelitis with BKA, right eye blindness, and medical non-adherence presenting with  lethargy and AMS following HD on 2/3.  Initial work up in the ED revealed patient with leukocytosis concerning for infection and imaging with concern for osteomyelitis for which vascular surgery recommended admission for BKA on 2/5 and ongoing antibiotics. Of note, patient was discharged in January following urgent transmetatarsal amputation after being found with gangrenous toes s/p wound vac and IV Ancef after wound cultures grew MSSA. During hospitalization, patient's mental status initially improved and she was responding appropriately on evaluation on 2/4 with surgery for BKA on 2/5. On 2/6, she was noted to be somnolent and slow to respond but she was  answering questions appropriately. On 2/7 patient had an episode of hypotension with HD but was noted to still be lethargic but followed commands and answered questions appropriately. Patient again had HD on 2/9 and became more lethargic, patient lethargic on exam during HD on 2/11.  Stroke:  scattered  punctate acute infarcts throughout both cerebral  hemispheres likely  embolic -in setting of osteomyelitis consideration for bacterial endocarditis or paradoxical embolism CT head No acute abnormality. CTA head & neck - No large vessel occlusion. Intracranial atherosclerosis including moderate right and mild left ICA stenoses. Widely patent cervical carotid arteries. Mild left V2 vertebral artery stenosis. Scattered small peribronchovascular pulmonary densities in both upper lobes, likely infectious or inflammatory MRI - Scattered punctate acute infarcts throughout both cerebral hemispheres which may be embolic. More confluent region of potentially subacute infarction in the left corona radiata. Chronic ischemia with multiple chronic infarcts as above. 2D Echo EF 70-75%, hyperdynamic LV, evidence of atrial level shunt detected by color-flow Doppler (the bubble study positive) EEG- Mild generalized slowing. This is a nonspecific finding but is consistent with a generalized disturbance of cerebral function and may be seen in toxic, metabolic, post anoxic, multifocal or diffuse structural abnormalities.   Focal slowing is seen intermittently in the right centrotemporal region. This is a finding that may be consistent with a focal disturbance of cerebral function such as her recent finding of b/l ischemic events. No electrographic seizures were recorded.  LDL 97 HgbA1c 10.4 VTE prophylaxis - heparin subq No antithrombotic prior to admission, now on aspirin 300 mg suppository daily.  Therapy recommendations: Pending Disposition: Pending  PFO on echocardiogram Plan for TEE next week LE Venous duplex  negative for DVT  Hypertension Home meds:  Losartan Stable BP goal (130-150) Long-term BP goal normotensive Midodrine added due to hypotension  Hyperlipidemia Home meds: None LDL 97, goal < 70 Start statin when medically appropriate  Diabetes type II Uncontrolled Home meds:  INsulin HgbA1c 10.4, goal < 7.0 CBGs SSI  Other Stroke Risk Factors Hx stroke/TIA Coronary artery disease  Other Active Problems Osteomyelitis BKA 2/5  End stage renal disease HD per nephrology  Cr 6.68 Leukocytosis - management by primary team WBC 15.7 Vancomycin, merrem Malnutrition Dietary/nutrition consult    Hospital day # 9  Patient presented with altered mental status in the setting of osteomyelitis and brain MRI scan showed multiple bilateral cortical and subcortical infarcts with strong possibility of cardiogenic embolism.  Recommend check TEE for endocarditis  .  Encourage oral intake and diet and speech therapy evaluation.  If she is able to eat adequately discontinue NG tube and if not eating adequately changed to core track tube tomorrow..  Continue ongoing medical management as per CCM team.  No family available at the bedside for discussion.  Discussed with Dr. Shearon Stalls critical care medicine.This patient is critically ill and at significant risk of neurological worsening, death and care requires constant monitoring of vital signs, hemodynamics,respiratory and cardiac monitoring, extensive review of multiple databases, frequent neurological assessment, discussion with family, other specialists and medical decision making of high complexity.I have made any additions or clarifications directly to the above note.This critical care time does not reflect procedure time, or teaching time or supervisory time of PA/NP/Med Resident etc but could involve care discussion time.  I spent 31 minutes of neurocritical care time  in the care of  this patient.      Antony Contras, MD Medical Director [facility] : [facility] Pager: [phone] 09/24/2021 1:17 PM  To contact Stroke Continuity provider, please refer to [email or web address] After hours, contact General Neurology

## 2021-09-24 NOTE — Progress Notes (Signed)
[facility] Progress Note   Subjective:  seen in ICU, awakens well but does not follow commands  Objective Vitals:   09/24/21 1000 09/24/21 1030 09/24/21 1100 09/24/21 1121  BP:      Pulse: 74  70   Resp: 15 11 (!) 9   Temp:    (!) 96.6 F (35.9 C)  TempSrc:    Axillary  SpO2: 100%  99%   Weight:      Height:       Physical Exam PJK:[id number] ill appearing, awakens to voice, not able to answer questions Hands in mittens Heart:RRR, no mrg Lungs:CTAB anterolaterally, nml WOB Abdomen:soft, NTND Extremities: no LLE edema, R BKA  Dialysis Access: RU AVF+ bruit    Dialysis Orders: [place] [facility] 3h 68min   400/ 600   59.5kg   2/2 bath  Prof 2   RU AV fistula  Hep 4000  Mircera 200 mcg IV every 2 weeks - last 1/26 Ancef 2 g 3 times weekly HD   Assessment/Plan: Sepsis/ shock - BP's better, off of pressors. Getting empiric IV vanc / meropenem.  Blood cx neg x 1. Looks better, but remains confused off and on  AMS - acute embolic CVA and/or watershed infarcts. TEE planned for Monday. IV abx as above.  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 Dispo on hold ESRD - on HD TTS.  Had HD Sat. Next HD 2/14. Try to keep SBP > 120 d/t #2.  Anemia of CKD- Hgb 8.2.  Aranesp 200 with HD on 2/9.  Transfuse pRBC prn  MBD ckd - Ca ok, phos up.  Home vdra, sensipar and renvela continued here.  HTN/volume - BP's good today. Added midodrine with HD. 2kg under which is just right after BKA surgery.  Severe protein calorie malnutrition - Alb 2s.  Cont protein supplements.  Renal diet w/fluid restrictions.  DMT2 - per PMD  Dispo - dc to SNF  Kelly Splinter, MD 09/24/2021, 12:37 PM     Filed Weights   09/23/21 0900 09/23/21 2330 09/24/21 0101  Weight: 57 kg 56.7 kg 56.7 kg    Intake/Output Summary (Last 24 hours) at 09/24/2021 1237 Last data filed at 09/24/2021 1115 Gross per 24 hour  Intake 958.06 ml  Output 1000 ml  Net -41.94 ml     Labs: Basic  Metabolic Panel: Recent Labs  Lab 09/21/21 1354 09/21/21 1855 09/22/21 1555 09/22/21 2008 09/23/21 0300 09/23/21 0734 09/23/21 1525 09/24/21 0257  NA 132*  --  133*  --  131*  --   --  133*  K 3.3*   < > 4.4   < > 4.2 4.5  --  3.3*  CL 93*  --  92*  --  92*  --   --  93*  CO2 23  --  23  --  20*  --   --  24  GLUCOSE 93  --  82  --  153*  --   --  244*  BUN 28*  --  44*  --  53*  --   --  36*  CREATININE 3.99*  --  5.93*  --  6.68*  --   --  4.76*  CALCIUM 7.8*  --  7.5*  --  7.5*  --   --  7.5*  PHOS 3.1  --   --   --   --   --  5.9* 7.0*   < > = values  in this interval not displayed.      CBC: Recent Labs  Lab 09/19/21 0332 09/21/21 1354 09/22/21 1342 09/23/21 0300 09/24/21 0257  WBC 16.5* 15.3* 14.5* 15.7* 14.6*  NEUTROABS  --  12.5* 11.6* 13.1*  --   HGB 8.4* 8.2* 8.3* 8.7* 7.8*  HCT 28.7* 27.0* 27.1* 29.0* 27.6*  MCV 91.1 89.1 90.9 92.9 94.5  PLT 254 245 252 331 315   Recent Labs  Lab 09/23/21 1923 09/23/21 2307 09/24/21 0300 09/24/21 0705 09/24/21 1102  GLUCAP 268* 254* 244* 264* 283*    Medications:  sodium chloride     sodium chloride     sodium chloride     sodium chloride     cefTRIAXone (ROCEPHIN)  IV     magnesium sulfate bolus IVPB     vancomycin Stopped (09/23/21 1611)    aspirin  325 mg Per NG tube Daily   calcitRIOL  0.25 mcg Per Tube BID   Chlorhexidine Gluconate Cloth  6 each Topical Q0600   cinacalcet  90 mg Oral Q supper   darbepoetin (ARANESP) injection - DIALYSIS  200 mcg Intravenous Q Thu-HD   dextrose  25 mL Intravenous Once   docusate  100 mg Per Tube Daily   dorzolamide-timolol  1 drop Left Eye BID   feeding supplement (NEPRO CARB STEADY)  1,000 mL Per Tube Q24H   feeding supplement (NEPRO CARB STEADY)  237 mL Per Tube BID BM   feeding supplement (PROSource TF)  45 mL Per Tube BID   free water  30 mL Per Tube Q6H   heparin injection (subcutaneous)  5,000 Units Subcutaneous Q8H   insulin aspart  0-6 Units Subcutaneous  Q4H   insulin glargine-yfgn  10 Units Subcutaneous Daily   lidocaine-prilocaine  1 application Topical UD   mouth rinse  15 mL Mouth Rinse BID   midodrine  5 mg Per Tube Q8H   multivitamin  1 tablet Per Tube Daily   naproxen  250 mg Per Tube BID WC   pantoprazole sodium  40 mg Per Tube Daily   sevelamer carbonate  1.6 g Per Tube TID WC   traMADol  100 mg Per Tube Q12H

## 2021-09-24 NOTE — Progress Notes (Shared)
STROKE TEAM PROGRESS NOTE   INTERVAL HISTORY Dialysis  running now. Plan for TEE next week. Patient is spontaneously moving upper extremities.  No spontaneous movement or movement to noxious stimuli in the lower extremities.  Patient does not open eyes to voice and speaks only occasional monosyllables but will not follow commands for me.  Apparently she did better for Dr. Shearon Walker earlier this morning and followed simple midline and one-step commands for her.  Vitals:   09/24/21 0600 09/24/21 0630 09/24/21 0700 09/24/21 0732  BP:      Pulse: 70 79    Resp: 12 15 13    Temp:    (!) 96.5 F (35.8 C)  TempSrc:    Axillary  SpO2: 100% 100%    Weight:      Height:       CBC:  Recent Labs  Lab 09/22/21 1342 09/23/21 0300 09/24/21 0257  WBC 14.5* 15.7* 14.6*  NEUTROABS 11.6* 13.1*  --   HGB 8.3* 8.7* 7.8*  HCT 27.1* 29.0* 27.6*  MCV 90.9 92.9 94.5  PLT 252 331 371    Basic Metabolic Panel:  Recent Labs  Lab 09/23/21 0300 09/23/21 0734 09/23/21 1525 09/24/21 0257  NA 131*  --   --  133*  K 4.2 4.5  --  3.3*  CL 92*  --   --  93*  CO2 20*  --   --  24  GLUCOSE 153*  --   --  244*  BUN 53*  --   --  36*  CREATININE 6.68*  --   --  4.76*  CALCIUM 7.5*  --   --  7.5*  MG 2.3  --   --  2.3  PHOS  --   --  5.9* 7.0*    Lipid Panel: No results for input(s): CHOL, TRIG, HDL, CHOLHDL, VLDL, LDLCALC in the last 168 hours. HgbA1c: No results for input(s): HGBA1C in the last 168 hours. Urine Drug Screen: No results for input(s): LABOPIA, COCAINSCRNUR, LABBENZ, AMPHETMU, THCU, LABBARB in the last 168 hours.  Alcohol Level No results for input(s): ETH in the last 168 hours.  IMAGING past 24 hours DG Abd Portable 1V  Result Date: 09/23/2021 CLINICAL DATA:  NG tube placement EXAM: PORTABLE ABDOMEN - 1 VIEW COMPARISON:  09/22/2021 FINDINGS: Enteric tube terminates in the distal gastric body. IMPRESSION: Enteric tube terminates in the distal gastric body. Electronically Signed   By:  Julian Hy M.D.   On: 09/23/2021 19:59   DG Abd Portable 1V  Result Date: 09/23/2021 CLINICAL DATA:  Encounter for NG tube placement. EXAM: PORTABLE ABDOMEN - 1 VIEW COMPARISON:  Earlier today FINDINGS: NG tube tip and side port are well below the level of the GE junction. The tip is in the expected location of the distal body of stomach. IMPRESSION: Satisfactory position of nasogastric tube with tip in the distal body of the stomach. Electronically Signed   By: Kerby Moors M.D.   On: 09/23/2021 17:36    PHYSICAL EXAM GENERAL: 89 African-American lady laying comfortably in hospital bed, in no acute distress, on dialysis LUNGS: Normal respiratory effort. Non-labored breathing on room air CV: Regular rate and rhythm on telemetry Musculoskeletal : Right below-knee amputation NEURO:  Mental Status: drowsy, does not open eyes but attempts, follows some commands "makes a fist" Minimal speech but said "good morning" She does not attempt to name objects or follow commands. Does not answer orientation questions.  Cranial Nerves:  II: Right pupil is nonreactive  to direct light, is 3 mm and irregular. The left pupil is 5 mm, irregular, and sluggishly reactive to light.  III, IV, VI: Does not open eyes, does not fixate or track examiner.  Eyes are dysconjugate with mild right eye exotropia and left eye hypertropia. V: Does not blink to threat throughout   VII: Face appears symmetric resting and with movement.  VIII: Unable to assess due to patient's condition IX, X: Does not cough spontaneously, does not allow for visualization of patient's soft palate XI: Head is grossly midline XII: Does not protrude tongue Motor:  Spontaneous movement bilateral upper extremities against gravity and purposeful.  There is no movement of bilateral lower extremities with application of noxious stimuli. Right BKA.  Tone and bulk are normal.  Sensation: Localizes to noxious stimuli in bilateral upper  extremities.  Coordination: Does not perform   Gait: Deferred  ASSESSMENT/PLAN [name] is a 41 y.o. female with history of ESRD on HD T/Th/Sat, type 2 diabetes mellitus with neuropathy, hypertension, osteomyelitis with BKA, right eye blindness, and medical non-adherence presenting with  lethargy and AMS following HD on 2/3.  Initial work up in the ED revealed patient with leukocytosis concerning for infection and imaging with concern for osteomyelitis for which vascular surgery recommended admission for BKA on 2/5 and ongoing antibiotics. Of note, patient was discharged in January following urgent transmetatarsal amputation after being found with gangrenous toes s/p wound vac and IV Ancef after wound cultures grew MSSA. During hospitalization, patient's mental status initially improved and she was responding appropriately on evaluation on 2/4 with surgery for BKA on 2/5. On 2/6, she was noted to be somnolent and slow to respond but she was answering questions appropriately. On 2/7 patient had an episode of hypotension with HD but was noted to still be lethargic but followed commands and answered questions appropriately. Patient again had HD on 2/9 and became more lethargic, patient lethargic on exam during HD on 2/11.  Stroke:  scattered punctate acute infarcts throughout both cerebral  hemispheres likely  embolic -in setting of osteomyelitis consideration for bacterial endocarditis or paradoxical embolism CT head No acute abnormality. CTA head & neck - No large vessel occlusion. Intracranial atherosclerosis including moderate right and mild left ICA stenoses. Widely patent cervical carotid arteries. Mild left V2 vertebral artery stenosis. Scattered small peribronchovascular pulmonary densities in both upper lobes, likely infectious or inflammatory MRI - Scattered punctate acute infarcts throughout both cerebral hemispheres which may be embolic. More confluent region of potentially subacute  infarction in the left corona radiata. Chronic ischemia with multiple chronic infarcts as above. 2D Echo EF 70-75%, hyperdynamic LV, evidence of atrial level shunt detected by color-flow Doppler (the bubble study positive) EEG- Mild generalized slowing. This is a nonspecific finding but is consistent with a generalized disturbance of cerebral function and may be seen in toxic, metabolic, post anoxic, multifocal or diffuse structural abnormalities.   Focal slowing is seen intermittently in the right centrotemporal region. This is a finding that may be consistent with a focal disturbance of cerebral function such as her recent finding of b/l ischemic events. No electrographic seizures were recorded.  LDL 97 HgbA1c 10.4 VTE prophylaxis - heparin subq No antithrombotic prior to admission, now on aspirin 300 mg suppository daily.  Therapy recommendations: Pending Disposition: Pending  PFO on echocardiogram Plan for TEE next week LE Venous duplex ordered  Hypertension Home meds:  Losartan Stable BP goal (130-150) Long-term BP goal normotensive Midodrine added due to hypotension  Hyperlipidemia Home meds: None LDL 97, goal < 70 Start statin when medically appropriate  Diabetes type II Uncontrolled Home meds:  INsulin HgbA1c 10.4, goal < 7.0 CBGs SSI  Other Stroke Risk Factors Hx stroke/TIA Coronary artery disease  Other Active Problems Osteomyelitis BKA 2/5  End stage renal disease HD per nephrology  Cr 6.68 Leukocytosis - management by primary team WBC 15.7 Vancomycin, merrem Malnutrition Dietary/nutrition consult    Hospital day # 9  Patient seen and examined by NP/APP with MD. MD to update note as needed.   Janine Ores, DNP, FNP-BC [facility] Pager: [phone] I have personally obtained history,examined this patient, reviewed notes, independently viewed imaging studies, participated in medical decision making and plan of care.ROS completed by me  personally and pertinent positives fully documented  I have made any additions or clarifications directly to the above note. Agree with note above.  Patient presented with altered mental status in the setting of osteomyelitis and brain MRI scan showed multiple bilateral cortical and subcortical infarcts with strong possibility of cardiogenic embolism.  Recommend check TEE for endocarditis and lower extremity venous Doppler for DVT.  Continue ongoing medical management as per CCM team.  No family available at the bedside for discussion.  Discussed with Dr. Shearon Walker critical care medicine.This patient is critically ill and at significant risk of neurological worsening, death and care requires constant monitoring of vital signs, hemodynamics,respiratory and cardiac monitoring, extensive review of multiple databases, frequent neurological assessment, discussion with family, other specialists and medical decision making of high complexity.I have made any additions or clarifications directly to the above note.This critical care time does not reflect procedure time, or teaching time or supervisory time of PA/NP/Med Resident etc but could involve care discussion time.  I spent 30 minutes of neurocritical care time  in the care of  this patient.      Antony Contras, MD Medical Director [facility] Pager: [phone] 09/24/2021 10:47 AM  To contact Stroke Continuity provider, please refer to [email or web address] After hours, contact General Neurology

## 2021-09-24 NOTE — TOC Progression Note (Signed)
Transition of Care [facility]) - Progression Note    Patient Details  Name: [name] MRN: [id number] Date of Birth: 24-Apr-1981  [facility] of [facility] CM/SW Contact  Ina Homes, [place] Phone Number: 09/24/2021, 12:37 PM  Clinical Narrative:     Meal voucher for pt's spouse [name], approved by Sedalia Muta, MSW, given to NT.   Expected Discharge Plan: Skilled Nursing Facility Barriers to Discharge: Continued Medical Work up, SNF Pending bed offer  Expected Discharge Plan and Services Expected Discharge Plan: [place] In-house Referral: Clinical Social Work   Post Acute Care Choice: [place] Living arrangements for the past 2 months: Apartment                                       Social Determinants of Health (SDOH) Interventions    Readmission Risk Interventions Readmission Risk Prevention Plan 08/23/2021  Transportation Screening Complete  HRI or Home Care Consult Complete  Social Work Consult for [place] Planning/Counseling Complete  Palliative Care Screening Not Applicable  Medication Review [employer] photographer) Complete  Some recent data might be hidden

## 2021-09-25 DIAGNOSIS — D638 Anemia in other chronic diseases classified elsewhere: Secondary | ICD-10-CM | POA: Diagnosis not present

## 2021-09-25 DIAGNOSIS — Z7189 Other specified counseling: Secondary | ICD-10-CM | POA: Diagnosis not present

## 2021-09-25 DIAGNOSIS — Z515 Encounter for palliative care: Secondary | ICD-10-CM

## 2021-09-25 DIAGNOSIS — N186 End stage renal disease: Secondary | ICD-10-CM | POA: Diagnosis not present

## 2021-09-25 DIAGNOSIS — L8990 Pressure ulcer of unspecified site, unspecified stage: Secondary | ICD-10-CM | POA: Insufficient documentation

## 2021-09-25 DIAGNOSIS — I6340 Cerebral infarction due to embolism of unspecified cerebral artery: Secondary | ICD-10-CM | POA: Diagnosis not present

## 2021-09-25 DIAGNOSIS — M869 Osteomyelitis, unspecified: Secondary | ICD-10-CM | POA: Diagnosis not present

## 2021-09-25 LAB — GLUCOSE, CAPILLARY
Glucose-Capillary: 274 mg/dL — ABNORMAL HIGH (ref 70–99)
Glucose-Capillary: 257 mg/dL — ABNORMAL HIGH (ref 70–99)
Glucose-Capillary: 262 mg/dL — ABNORMAL HIGH (ref 70–99)
Glucose-Capillary: 242 mg/dL — ABNORMAL HIGH (ref 70–99)
Glucose-Capillary: 300 mg/dL — ABNORMAL HIGH (ref 70–99)

## 2021-09-25 MED ORDER — NEPRO/CARBSTEADY PO LIQD
1000.0000 mL | ORAL | Status: DC
  Administered 2021-09-25 – 2021-09-26 (×2): 1000 mL
  Filled 2021-09-25 (×3): qty 1000

## 2021-09-25 MED ORDER — INSULIN GLARGINE-YFGN 100 UNIT/ML ~~LOC~~ SOLN
10.0000 [IU] | Freq: Two times a day (BID) | SUBCUTANEOUS | Status: DC
  Administered 2021-09-25 – 2021-09-27 (×5): 10 [IU] via SUBCUTANEOUS
  Filled 2021-09-25 (×8): qty 0.1

## 2021-09-25 MED ORDER — GERHARDT'S BUTT CREAM
TOPICAL_CREAM | Freq: Three times a day (TID) | CUTANEOUS | Status: AC
  Administered 2021-09-27 (×2): 1 via TOPICAL
  Filled 2021-09-25 (×4): qty 1

## 2021-09-25 NOTE — Progress Notes (Signed)
NAME:  [name], MRN:  [id number], DOB:  1981-03-14, LOS: 30 ADMISSION DATE:  09/15/2021, CONSULTATION DATE:  09/22/21 REFERRING MD:  [place] Crane - TRH, CHIEF COMPLAINT:  AMS   History of Present Illness:  41 yo F PMH ESRD on TTS HD, DM2, Neuropathy, HTN, medical non-compliance presented, gangrene RLE to ED 09/15/21 from HD after pt became altered after HD session. Admitted to [facility] for further care. Imaging of R foot obtained which were concerning for osteomyelitis. VVS consulted and pt underwent R BKA 2/5.  Underwent HD 2/9. Volume removal was limited by hypotension. Midodrine added, given albumin  On 2/10 AM pt was more lethargic. Rapid response was called as pt was reportedly minimally responsive to sternal rub. CT H obtained which did not reveal acute intracranial abnormality.   PCCM consulted for AMS in this setting   Pertinent  Medical History  ESRD MSSA bacteremia Medical non-compliance HTN DM2 Neuropathy Gangrene / Osteomyelitis   Significant Hospital Events: Including procedures, antibiotic start and stop dates in addition to other pertinent events   2/3 admit to [facility] 2/5 OR for F BKA 2/2 osteo 2/9 hypotensive with HD lijmiting UF, rcvd albumin and started midodrine 2/10 lethargic. CT H no acute changes. 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 rate 10, height 5\' 1"  (1.549 m), weight 56.7 kg, SpO2 98 %.        Intake/Output Summary (Last 24 hours) at 09/25/2021 0973 Last data filed at 09/25/2021 0530 Gross per 24 hour  Intake 1046.07 ml  Output --  Net 1046.07 ml   Filed Weights   09/23/21 2330 09/24/21 0101 09/25/21 0423  Weight: 56.7 kg 56.7 kg 56.7 kg    Examination:  General:  Resting comfortably in bed HENT: NCAT OP clear PULM: CTA B, normal effort CV: RRR, no mgr GI: BS+, soft, nontender MSK: normal bulk and tone, s/p R BKA, incision clean without redness or drainage Neuro: drowsy, stirs to voice, moves extremities spontaneously but doesn't follow commands   Na 133 K 3.3 Cr 4.76 Hgb 7.8  Resolved Hospital Problem list   Septic shock  Assessment & Plan:   Acute metabolic encephalopathy due to Acute embolic strokes Watershed infarct TEE for today Post stroke prevention per neurology/stroke Frequent orientation Hold sedatives  ESRD HD per renal Monitor BMET  Replace electrolytes as needed  Anemia of chronic disease Monitor for bleeding Transfuse PRBC for Hgb < 7 gm/dL  DM2 with hyperglycemia RLE osteomyelitis s/p R BKA SSI to continue Increase semglee Monitor glucose q4h  Malnutrition NPO for TEE, then resume tube feeding  Physical debility  PT/OT if/when mental status improves  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  Will consult palliative care.   Best Practice (right click and "Reselect all SmartList Selections" daily)  Diet/type: Tube feeds and PO as tolerated based on level of alertness DVT prophylaxis: prophylactic heparin  GI prophylaxis: PPI Lines: N/A Foley:  N/A Code Status:  full code Last date of multidisciplinary goals of care discussion Olam Idler [name] updated at  bedside 2/12 in am. He is out of money and is asking for support for meal vouchers or bus fare.] 2/13: husband not at bedside     Labs   CBC: Recent Labs  Lab 09/19/21 0332 09/21/21 1354 09/22/21 1342 09/23/21 0300 09/24/21 0257  WBC 16.5* 15.3* 14.5* 15.7* 14.6*  NEUTROABS  --  12.5* 11.6* 13.1*  --   HGB 8.4* 8.2* 8.3* 8.7* 7.8*  HCT 28.7* 27.0* 27.1* 29.0* 27.6*  MCV 91.1 89.1 90.9 92.9 94.5  PLT 254 245 252 331 938    Basic Metabolic Panel: Recent Labs  Lab 09/19/21 0332 09/19/21 1347 09/21/21 1354 09/21/21 1855 09/22/21 1555 09/22/21 2008 09/23/21 0300 09/23/21 0734 09/23/21 1525 09/24/21 0257  NA 134*  --  132*  --  133*  --  131*  --   --  133*  K 4.6   < > 3.3*   < > 4.4 3.9 4.2 4.5  --  3.3*  CL 96*  --  93*  --  92*  --  92*  --   --  93*  CO2 22  --  23  --  23  --  20*  --   --  24  GLUCOSE 216*  --  93  --  82  --  153*  --   --  244*  BUN 72*  --  28*  --  44*  --  53*  --   --  36*  CREATININE 7.87*  --  3.99*  --  5.93*  --  6.68*  --   --  4.76*  CALCIUM 7.7*  --  7.8*  --  7.5*  --  7.5*  --   --  7.5*  MG  --   --  1.9  --   --   --  2.3  --   --  2.3  PHOS  --   --  3.1  --   --   --   --   --  5.9* 7.0*   < > = values in this interval not displayed.   GFR: Estimated Creatinine Clearance: 12 mL/min (A) (by C-G formula based on SCr of 4.76 mg/dL (H)). Recent Labs  Lab 09/21/21 1354 09/22/21 1336 09/22/21 1342 09/22/21 1555 09/22/21 2008 09/23/21 0300 09/24/21 0257  PROCALCITON  --   --   --  17.01  --   --   --   WBC 15.3*  --  14.5*  --   --  15.7* 14.6*  LATICACIDVEN  --  1.4  --  1.6 0.9  --   --     Liver Function Tests: Recent Labs  Lab 09/21/21 1354 09/22/21 1555 09/23/21 0300 09/24/21 0257  AST 95* 865* 581* 306*  ALT 14 120* 108* 85*  ALKPHOS 88 102 99 100  BILITOT 1.0 1.4* 1.5* 0.9  PROT 7.7 8.1 7.5 7.2  ALBUMIN 2.2* 2.0* 1.8* 1.8*   No results for input(s): LIPASE, AMYLASE in the last 168 hours. Recent Labs   Lab 09/21/21 1354  AMMONIA 17    ABG    Component Value Date/Time   PHART 7.440 09/22/2021 1134   PCO2ART 34.8 09/22/2021 1134   PO2ART 63.6 (L) 09/22/2021 1134  HCO3 23.3 09/22/2021 1134   TCO2 30 06/30/2020 0237   ACIDBASEDEF 0.4 09/22/2021 1134   O2SAT 91.9 09/22/2021 1134     Coagulation Profile: No results for input(s): INR, PROTIME in the last 168 hours.  Cardiac Enzymes: No results for input(s): CKTOTAL, CKMB, CKMBINDEX, TROPONINI in the last 168 hours.  HbA1C: Hemoglobin A1C  Date/Time Value Ref Range Status  09/12/2020 10:22 AM 10.3 (A) 4.0 - 5.6 % Final   Hgb A1c MFr Bld  Date/Time Value Ref Range Status  08/11/2021 04:15 AM 10.4 (H) 4.8 - 5.6 % Final    Comment:    (NOTE) Pre diabetes:          5.7%-6.4%  Diabetes:              >6.4%  Glycemic control for   <7.0% adults with diabetes   06/30/2020 11:06 AM 9.9 (H) 4.8 - 5.6 % Final    Comment:    (NOTE) Pre diabetes:          5.7%-6.4%  Diabetes:              >6.4%  Glycemic control for   <7.0% adults with diabetes    Roselie Awkward, MD [place] [facility] Pager: [phone] Cell: [phone] After 7:00 pm call Elink  [phone]

## 2021-09-25 NOTE — Plan of Care (Signed)
°  Problem: Education: Goal: Knowledge of General Education information will improve Description: Including pain rating scale, medication(s)/side effects and non-pharmacologic comfort measures Outcome: Progressing   Problem: Health Behavior/Discharge Planning: Goal: Ability to manage health-related needs will improve Outcome: Progressing   Problem: Clinical Measurements: Goal: Ability to maintain clinical measurements within normal limits will improve 09/25/2021 2210 by Vinh Sachs, Kristopher Glee, RN Outcome: Progressing 09/25/2021 2210 by Fredrick Geoghegan, Kristopher Glee, RN Outcome: Progressing Goal: Diagnostic test results will improve Outcome: Progressing   Problem: Pain Managment: Goal: General experience of comfort will improve 09/25/2021 2210 by Maurisha Mongeau, Kristopher Glee, RN Outcome: Progressing 09/25/2021 2210 by Roslyn Smiling, RN Outcome: Progressing

## 2021-09-25 NOTE — Progress Notes (Signed)
Physical Therapy Treatment Patient Details Name: [name] MRN: [id number] DOB: [birth year] Today's Date: 09/25/2021   History of Present Illness [name] is a 41 y.o. female who was sent after completing dialysis session 09/15/21 due to staff concern for altered mental status.  She was not speaking to them. 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    PT Comments    Pt with slightly improved arousal today. Able to state name, birthday, age, and that she was in the hospital. Follows 1 step commands < 25% of the time and keeps eyes closed throughout session. Requiring two person total assist for bed mobility. Sat edge of bed for ~10 minutes, with decreased truncal control. Participated in exercises minimally. Did not engage with attempts for lateral scooting or weightbearing through LLE. Demonstrates gross weakness, deconditioning, poor sitting balance, and decreased cognition. Continue to recommend SNF for ongoing Physical Therapy.     Recommendations for follow up therapy are one component of a multi-disciplinary discharge planning process, led by the attending physician.  Recommendations may be updated based on patient status, additional functional criteria and insurance authorization.  Follow Up Recommendations  Skilled nursing-short term rehab (<3 hours/day)     Assistance Recommended at Discharge Frequent or constant Supervision/Assistance  Patient can return home with the following Two people to help with walking and/or transfers;Two people to help with bathing/dressing/bathroom   Equipment Recommendations  Other (comment) (defer)    Recommendations for Other Services       Precautions / Restrictions Precautions Precautions: Fall Restrictions Weight  Bearing Restrictions: Yes RLE Weight Bearing: Non weight bearing     Mobility  Bed Mobility Overal bed mobility: Needs Assistance Bed Mobility: Supine to Sit, Sit to Supine     Supine to sit: Total assist, +2 for physical assistance Sit to supine: Total assist, +2 for physical assistance   General bed mobility comments: No initiation noted by pt    Transfers                        Ambulation/Gait                   Stairs             Wheelchair Mobility    Modified Rankin (Stroke Patients Only)       Balance Overall balance assessment: Needs assistance Sitting-balance support: No upper extremity supported (L foot supported) Sitting balance-Leahy Scale: Poor Sitting balance - Comments: up to maxA for sitting balance, poor truncal control with loss of balance in all directions                                    Cognition Arousal/Alertness: Lethargic Behavior During Therapy: Flat affect Overall Cognitive Status: Impaired/Different from baseline Area of Impairment: Orientation, Attention, Following commands, Safety/judgement, Awareness, Problem solving                 Orientation Level: Disoriented to, Place, Time, Situation Current Attention Level: Focused Memory: Decreased recall of precautions, Decreased short-term memory Following Commands: Follows one step commands inconsistently Safety/Judgement: Decreased awareness of safety, Decreased awareness of deficits Awareness: Intellectual Problem Solving: Slow processing, Decreased initiation, Difficulty sequencing, Requires  verbal cues, Requires tactile cues General Comments: Pt able to state name, birthday, and age and that she was in a hospital. Follows 1 step commands ~25% of the time. Keeps eyes closed throughout session        Exercises General Exercises - Lower Extremity Long Arc Quad: PROM, Left, 5 reps, Seated Heel Slides: AAROM, Both, 10 reps, Supine Hip  ABduction/ADduction: AAROM, Both, 10 reps, Supine Straight Leg Raises: AAROM, Right, 5 reps, Supine    General Comments        Pertinent Vitals/Pain Pain Assessment Pain Assessment: Faces Faces Pain Scale: No hurt    Home Living                          Prior Function            PT Goals (current goals can now be found in the care plan section) Acute Rehab PT Goals Patient Stated Goal: could not state Potential to Achieve Goals: Fair    Frequency    Min 3X/week      PT Plan Current plan remains appropriate    Co-evaluation              AM-PAC PT "6 Clicks" Mobility   Outcome Measure  Help needed turning from your back to your side while in a flat bed without using bedrails?: Total Help needed moving from lying on your back to sitting on the side of a flat bed without using bedrails?: Total Help needed moving to and from a bed to a chair (including a wheelchair)?: Total Help needed standing up from a chair using your arms (e.g., wheelchair or bedside chair)?: Total Help needed to walk in hospital room?: Total Help needed climbing 3-5 steps with a railing? : Total 6 Click Score: 6    End of Session   Activity Tolerance: Patient limited by lethargy Patient left: in bed;with call bell/phone within reach;with family/visitor present Nurse Communication: Mobility status PT Visit Diagnosis: Other abnormalities of gait and mobility (R26.89);Muscle weakness (generalized) (M62.81);Difficulty in walking, not elsewhere classified (R26.2) Pain - Right/Left: Right Pain - part of body: Leg     Time: 1102-1117 PT Time Calculation (min) (ACUTE ONLY): 27 min  Charges:  $Therapeutic Exercise: 8-22 mins $Therapeutic Activity: 8-22 mins                     Wyona Almas, PT, DPT Acute Rehabilitation Services Pager [phone] Office [phone]    Deno Etienne 09/25/2021, 10:26 AM

## 2021-09-25 NOTE — Progress Notes (Signed)
Pt's chart reviewed. Contacted [facility] to f/u on pt's out-pt HD needs at snf at d/c. Will await a return call.   Melven Sartorius Renal Navigator [phone]
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
## 2021-09-25 NOTE — Consult Note (Signed)
[facility] Nurse wound consult note Consultation was completed by review of records, images and assistance from the bedside nurse/clinical staff.   Reason for Consult:MASD from incontinence, patient is anuric  Wound type: moisture associated skin damage related to incontinence of stool; reported stool from the vagina.  Notified MD that this would be outside of the scope of the [place] nurse. General surgery or GYN should be involved for diagnosis and treatment plan Pressure Injury POA: NA Measurement: affected area extends over perineum, buttocks and groin Wound bed: red and painful pre nursing notes Periwound: intact  Dressing procedure/placement/frequency:  Will order Gerharts butt cream to affected areas TID and PRN Consider FMS or rectal pouch if stool is liquid, appears when FMS attempted to be placed this weekend, noted to be coming out her vagina.  Attending MD's aware need for surgical consultation.    Re consult if needed, will not follow at this time. Thanks  Agostino Gorin [employer], RN,CWOCN, CNS, [place] [phone])

## 2021-09-25 NOTE — Progress Notes (Signed)
Lab personnel x2 came by and attempted to draw blood- unsuccessful (Stuck a total of 4 times). Lab unable to attempt blood draw for 24 hours- next attempt 2/14 0600.  Will pass onto dayshift RN.

## 2021-09-25 NOTE — Progress Notes (Signed)
STROKE TEAM PROGRESS NOTE   INTERVAL HISTORY  Patient sitting up in bed with eyes closed but is is following commands well and moving all r extremities.   .  Patient does not open eyes to voice and speaks only occasional short sentences but will   follow most midline and peripheral commands for me. .  Vital signs stable.  Neurological exam slightly improved Vitals:   09/25/21 0400 09/25/21 0423 09/25/21 0805 09/25/21 1119  BP: (!) 131/33  (!) 101/36 (!) 95/58  Pulse: 74  75 72  Resp: 15  10 10   Temp:  (!) 97.5 F (36.4 C)    TempSrc:  Axillary    SpO2: 100%  98% 100%  Weight:  56.7 kg    Height:       CBC:  Recent Labs  Lab 09/22/21 1342 09/23/21 0300 09/24/21 0257  WBC 14.5* 15.7* 14.6*  NEUTROABS 11.6* 13.1*  --   HGB 8.3* 8.7* 7.8*  HCT 27.1* 29.0* 27.6*  MCV 90.9 92.9 94.5  PLT 252 331 737   Basic Metabolic Panel:  Recent Labs  Lab 09/23/21 0300 09/23/21 0734 09/23/21 1525 09/24/21 0257  NA 131*  --   --  133*  K 4.2 4.5  --  3.3*  CL 92*  --   --  93*  CO2 20*  --   --  24  GLUCOSE 153*  --   --  244*  BUN 53*  --   --  36*  CREATININE 6.68*  --   --  4.76*  CALCIUM 7.5*  --   --  7.5*  MG 2.3  --   --  2.3  PHOS  --   --  5.9* 7.0*   Lipid Panel: No results for input(s): CHOL, TRIG, HDL, CHOLHDL, VLDL, LDLCALC in the last 168 hours. HgbA1c: No results for input(s): HGBA1C in the last 168 hours. Urine Drug Screen: No results for input(s): LABOPIA, COCAINSCRNUR, LABBENZ, AMPHETMU, THCU, LABBARB in the last 168 hours.  Alcohol Level No results for input(s): ETH in the last 168 hours.  IMAGING past 24 hours No results found.  PHYSICAL EXAM GENERAL: 35 African-American lady laying comfortably in hospital bed, in no acute distress, on dialysis LUNGS: Normal respiratory effort. Non-labored breathing on room air CV: Regular rate and rhythm on telemetry Musculoskeletal : Right below-knee amputation NEURO:  Mental Status: drowsy, does not open eyes to  stimuli throughout, does follow a few commands  She does speak a few words and occasional short sentences she does not attempt to name objects or follow commands. Does not answer orientation questions.  Cranial Nerves:  II: Right pupil is nonreactive to direct light, is 3 mm and irregular. The left pupil is 5 mm, irregular, and sluggishly reactive to light.  III, IV, VI: Does not open eyes, does not fixate or track examiner.  Eyes are dysconjugate with mild right eye exotropia and left eye hyportropia. V: Does not blink to threat throughout   VII: Face appears symmetric resting and with movement.  VIII: Unable to assess due to patient's condition IX, X: Does not cough spontaneously, does not allow for visualization of patient's soft palate XI: Head is grossly midline XII: Does not protrude tongue Motor:  Spontaneous movement in right upper extremity which is purposeful.  Left upper extremity appears to be weaker today but withdraws to noxious stimuli.  Withdraws right lower extremity noxious stimuli but not left lower extremity.  She is able to maintain tone with  left lower extremity when flexed at the hip and.  . Right BKA.  Tone and bulk are normal.  Sensation: Localizes to noxious stimuli in bilateral upper extremities.  Coordination: Does not perform   Gait: Deferred  ASSESSMENT/PLAN Ms. Zameria Vogl is a 41 y.o. female with history of ESRD on HD T/Th/Sat, type 2 diabetes mellitus with neuropathy, hypertension, osteomyelitis with BKA, right eye blindness, and medical non-adherence presenting with  lethargy and AMS following HD on 2/3.  Initial work up in the ED revealed patient with leukocytosis concerning for infection and imaging with concern for osteomyelitis for which vascular surgery recommended admission for BKA on 2/5 and ongoing antibiotics. Of note, patient was discharged in January following urgent transmetatarsal amputation after being found with gangrenous toes s/p wound vac and IV  Ancef after wound cultures grew MSSA. During hospitalization, patient's mental status initially improved and she was responding appropriately on evaluation on 2/4 with surgery for BKA on 2/5. On 2/6, she was noted to be somnolent and slow to respond but she was answering questions appropriately. On 2/7 patient had an episode of hypotension with HD but was noted to still be lethargic but followed commands and answered questions appropriately. Patient again had HD on 2/9 and became more lethargic, patient lethargic on exam during HD on 2/11.  Stroke:  scattered punctate acute infarcts throughout both cerebral  hemispheres likely  embolic -in setting of osteomyelitis consideration for bacterial endocarditis or paradoxical embolism CT head No acute abnormality. CTA head & neck - No large vessel occlusion. Intracranial atherosclerosis including moderate right and mild left ICA stenoses. Widely patent cervical carotid arteries. Mild left V2 vertebral artery stenosis. Scattered small peribronchovascular pulmonary densities in both upper lobes, likely infectious or inflammatory MRI - Scattered punctate acute infarcts throughout both cerebral hemispheres which may be embolic. More confluent region of potentially subacute infarction in the left corona radiata. Chronic ischemia with multiple chronic infarcts as above. 2D Echo EF 70-75%, hyperdynamic LV, evidence of atrial level shunt detected by color-flow Doppler (the bubble study positive) EEG- Mild generalized slowing. This is a nonspecific finding but is consistent with a generalized disturbance of cerebral function and may be seen in toxic, metabolic, post anoxic, multifocal or diffuse structural abnormalities.   Focal slowing is seen intermittently in the right centrotemporal region. This is a finding that may be consistent with a focal disturbance of cerebral function such as her recent finding of b/l ischemic events. No electrographic seizures were recorded.   LDL 97 HgbA1c 10.4 VTE prophylaxis - heparin subq No antithrombotic prior to admission, now on aspirin 300 mg suppository daily.  Therapy recommendations: Pending Disposition: Pending  PFO on echocardiogram Plan for TEE next week LE Venous duplex negative for DVT  Hypertension Home meds:  Losartan Stable BP goal (130-150) Long-term BP goal normotensive Midodrine added due to hypotension  Hyperlipidemia Home meds: None LDL 97, goal < 70 Start statin when medically appropriate  Diabetes type II Uncontrolled Home meds:  INsulin HgbA1c 10.4, goal < 7.0 CBGs SSI  Other Stroke Risk Factors Hx stroke/TIA Coronary artery disease  Other Active Problems Osteomyelitis BKA 2/5  End stage renal disease HD per nephrology  Cr 6.68 Leukocytosis - management by primary team WBC 15.7 Vancomycin, merrem Malnutrition Dietary/nutrition consult    Hospital day # 10  Patient presented with altered mental status in the setting of osteomyelitis and brain MRI scan showed multiple bilateral cortical and subcortical infarcts with strong possibility of cardiogenic embolism.  Await TEE  for endocarditis  .  Encourage oral intake and diet and speech therapy evaluation.    Continue ongoing medical management as per CCM team.  No family available at the bedside for discussion.  Greater than 50% time during this 35-minute visit was spent in counseling and coordination of care and discussion with patient and care team and answering questions.     Antony Contras, MD Medical Director [facility] Pager: [phone] 09/25/2021 2:48 PM  To contact Stroke Continuity provider, please refer to [email or web address] After hours, contact General Neurology

## 2021-09-25 NOTE — Progress Notes (Signed)
[facility] Progress Note   Subjective:  no acute events, open for TEE today. Awakes to voice but not following commands. Discussed with significant other at bedside  Objective Vitals:   09/24/21 2100 09/25/21 0000 09/25/21 0400 09/25/21 0423  BP: (!) 125/29 (!) 125/30 (!) 131/33   Pulse: 78 82 74   Resp: 10 10 15    Temp: 97.8 F (36.6 C)   (!) 97.5 F (36.4 C)  TempSrc: Axillary   Axillary  SpO2: 100% 100% 100%   Weight:    56.7 kg  Height:       Physical Exam [id number] ill appearing, awakens to voice, not able to answer questions Heart:RRR Lungs:CTAB anterolaterally, no incd WOB Abdomen:soft, NTND Extremities: no LLE edema, R BKA  Dialysis Access: RU AVF+ bruit   Filed Weights   09/23/21 2330 09/24/21 0101 09/25/21 0423  Weight: 56.7 kg 56.7 kg 56.7 kg    Intake/Output Summary (Last 24 hours) at 09/25/2021 0744 Last data filed at 09/25/2021 0530 Gross per 24 hour  Intake 1216.07 ml  Output --  Net 1216.07 ml    Labs: Basic Metabolic Panel: Recent Labs  Lab 09/21/21 1354 09/21/21 1855 09/22/21 1555 09/22/21 2008 09/23/21 0300 09/23/21 0734 09/23/21 1525 09/24/21 0257  NA 132*  --  133*  --  131*  --   --  133*  K 3.3*   < > 4.4   < > 4.2 4.5  --  3.3*  CL 93*  --  92*  --  92*  --   --  93*  CO2 23  --  23  --  20*  --   --  24  GLUCOSE 93  --  82  --  153*  --   --  244*  BUN 28*  --  44*  --  53*  --   --  36*  CREATININE 3.99*  --  5.93*  --  6.68*  --   --  4.76*  CALCIUM 7.8*  --  7.5*  --  7.5*  --   --  7.5*  PHOS 3.1  --   --   --   --   --  5.9* 7.0*   < > = values in this interval not displayed.     CBC: Recent Labs  Lab 09/19/21 0332 09/21/21 1354 09/22/21 1342 09/23/21 0300 09/24/21 0257  WBC 16.5* 15.3* 14.5* 15.7* 14.6*  NEUTROABS  --  12.5* 11.6* 13.1*  --   HGB 8.4* 8.2* 8.3* 8.7* 7.8*  HCT 28.7* 27.0* 27.1* 29.0* 27.6*  MCV 91.1 89.1 90.9 92.9 94.5  PLT 254 245 252 331 315   Recent Labs  Lab  09/24/21 1102 09/24/21 1621 09/24/21 2043 09/24/21 2338 09/25/21 0411  GLUCAP 283* 242* 275* 389* 274*   Medications:  sodium chloride     sodium chloride     sodium chloride     sodium chloride     cefTRIAXone (ROCEPHIN)  IV Stopped (09/24/21 1753)   magnesium sulfate bolus IVPB     vancomycin Stopped (09/23/21 1611)    aspirin  325 mg Per NG tube Daily   calcitRIOL  0.25 mcg Per Tube BID   Chlorhexidine Gluconate Cloth  6 each Topical Q0600   cinacalcet  90 mg Oral Q supper   darbepoetin (ARANESP) injection - DIALYSIS  200 mcg Intravenous Q Thu-HD   dextrose  25 mL Intravenous Once   docusate  100 mg Per Tube Daily   dorzolamide-timolol  1 drop Left Eye BID   feeding supplement (NEPRO CARB STEADY)  1,000 mL Per Tube Q24H   feeding supplement (NEPRO CARB STEADY)  237 mL Per Tube BID BM   feeding supplement (PROSource TF)  45 mL Per Tube BID   free water  30 mL Per Tube Q6H   heparin injection (subcutaneous)  5,000 Units Subcutaneous Q8H   insulin aspart  0-6 Units Subcutaneous Q4H   insulin glargine-yfgn  10 Units Subcutaneous Daily   lidocaine-prilocaine  1 application Topical UD   mouth rinse  15 mL Mouth Rinse BID   midodrine  5 mg Per Tube Q8H   multivitamin  1 tablet Per Tube Daily   naproxen  250 mg Per Tube BID WC   pantoprazole sodium  40 mg Per Tube Daily   sevelamer carbonate  1.6 g Per Tube TID WC   traMADol  100 mg Per Tube Q12H      Dialysis Orders: [place] T[facility] 3h 92min   400/ 600   59.5kg   2/2 bath  Prof 2   RU AV fistula  Hep 4000  Mircera 200 mcg IV every 2 weeks - last 1/26 Ancef 2 g 3 times weekly HD   Assessment/Plan: Sepsis/ shock - BP's better, off of pressors. Getting empiric IV vanc / meropenem.  Blood cx neg x 1. Looks better, but remains confused off and on  AMS - acute embolic CVA and/or watershed infarcts. TEE planned for 2/13. IV abx as above.  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 Dispo on hold ESRD - on HD  TTS.  Next HD 2/14. Try to keep SBP > 120 d/t #2.  Anemia of CKD- Hgb 7.8.  Aranesp 200 with HD on 2/9.  Transfuse pRBC prn  MBD ckd - Ca ok, phos up.  Home vdra, sensipar and renvela continued here.  HTN/volume - BP's good today. Added midodrine with HD. 2kg under which is just right after BKA surgery.  Severe protein calorie malnutrition - Alb 2s.  Cont protein supplements.  Renal diet w/fluid restrictions.  DMT2 - per PMD  Dispo - dc to SNF, per primary  Gean Quint, MD [facility]

## 2021-09-25 NOTE — TOC Progression Note (Signed)
Transition of Care [facility]) - Progression Note    Patient Details  Name: [name] MRN: [id number] Date of Birth: [birth year]  Transition of Care [facility]) CM/SW [place], [place] Phone Number: 09/25/2021, 4:59 PM  Clinical Narrative:    CSW spoke with pt sister who will come to visit pt today and will start on the application for HCPOA, pt sister will need to meet with chaplin for notary but will follow up with CSW tomorrow to get message to the chaplin.    Expected Discharge Plan: Skilled Nursing Facility Barriers to Discharge: Continued Medical Work up, SNF Pending bed offer  Expected Discharge Plan and Services Expected Discharge Plan: [place] In-house Referral: Clinical Social Work   Post Acute Care Choice: [place] Living arrangements for the past 2 months: Apartment                                       Social Determinants of Health (SDOH) Interventions    Readmission Risk Interventions Readmission Risk Prevention Plan 08/23/2021  Transportation Screening Complete  HRI or Home Care Consult Complete  Social Work Consult for [place] Planning/Counseling Complete  Palliative Care Screening Not Applicable  Medication Review Press photographer) Complete  Some recent data might be hidden

## 2021-09-25 NOTE — Progress Notes (Signed)
°  Progress Note    09/25/2021 7:42 AM 8 Days Post-Op  Subjective:  sleeping, [name] her husband is at bedside  Afebrile  Vitals:   09/25/21 0400 09/25/21 0423  BP: (!) 131/33   Pulse: 74   Resp: 15   Temp:  (!) 97.5 F (36.4 C)  SpO2: 100%     Physical Exam: Incisions:       CBC    Component Value Date/Time   WBC 14.6 (H) 09/24/2021 0257   RBC 2.92 (L) 09/24/2021 0257   HGB 7.8 (L) 09/24/2021 0257   HGB 10.6 (L) 05/18/2020 0927   HCT 27.6 (L) 09/24/2021 0257   HCT 35.1 05/18/2020 0927   PLT 315 09/24/2021 0257   PLT 186 05/18/2020 0927   MCV 94.5 09/24/2021 0257   MCV 89 05/18/2020 0927   MCH 26.7 09/24/2021 0257   MCHC 28.3 (L) 09/24/2021 0257   RDW 26.4 (H) 09/24/2021 0257   RDW 13.9 05/18/2020 0927   LYMPHSABS 1.2 09/23/2021 0300   LYMPHSABS 1.6 05/18/2020 0927   MONOABS 1.0 09/23/2021 0300   EOSABS 0.2 09/23/2021 0300   EOSABS 0.6 (H) 05/18/2020 0927   BASOSABS 0.1 09/23/2021 0300   BASOSABS 0.0 05/18/2020 0927    BMET    Component Value Date/Time   NA 133 (L) 09/24/2021 0257   NA 133 (L) 05/18/2020 0927   K 3.3 (L) 09/24/2021 0257   CL 93 (L) 09/24/2021 0257   CO2 24 09/24/2021 0257   GLUCOSE 244 (H) 09/24/2021 0257   BUN 36 (H) 09/24/2021 0257   BUN 45 (H) 05/18/2020 0927   CREATININE 4.76 (H) 09/24/2021 0257   CALCIUM 7.5 (L) 09/24/2021 0257   GFRNONAA 11 (L) 09/24/2021 0257   GFRAA 6 (L) 05/18/2020 0927    INR    Component Value Date/Time   INR 1.2 08/10/2021 2212     Intake/Output Summary (Last 24 hours) at 09/25/2021 0742 Last data filed at 09/25/2021 0530 Gross per 24 hour  Intake 1216.07 ml  Output --  Net 1216.07 ml     Assessment/Plan:  41 y.o. female is s/p right below knee amputation  8 Days Post-Op  -incision is healing and there is no drainage, however, still some concerning skin discoloration.  The stump feels warmer today compared with this past Friday.   Will continue to follow  -pt remains  afebrile.   [name], PA-C [facility] [phone] 09/25/2021 7:42 AM

## 2021-09-25 NOTE — Progress Notes (Signed)
Pharmacy Antibiotic Note  [name] is a 41 y.o. female admitted on 09/15/2021 with  R foot osteo  . Pt lethargic and minimally responsive this morning, full work-up underway. 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 Pharmacy has been consulted for vancomycin dosing with concern for endocarditis and possible meningitis. She is also on rocephin (Pt has tolerated cephalosporins in the past). She is also noted with ESRD on HD TTS. -Plans for TEE today -cultures- ngtd   Plan: -Continue Vanc 500mg  with iHD TTS  -Will follow antibiotic plans and check levels as needed    Height: 5\' 1"  (154.9 cm) Weight: 56.7 kg (125 lb) IBW/kg (Calculated) : 47.8  Temp (24hrs), Avg:97.5 F (36.4 C), Min:96.6 F (35.9 C), Max:97.8 F (36.6 C)  Recent Labs  Lab 09/19/21 0332 09/21/21 1354 09/22/21 1336 09/22/21 1342 09/22/21 1555 09/22/21 2008 09/23/21 0300 09/24/21 0257  WBC 16.5* 15.3*  --  14.5*  --   --  15.7* 14.6*  CREATININE 7.87* 3.99*  --   --  5.93*  --  6.68* 4.76*  LATICACIDVEN  --   --  1.4  --  1.6 0.9  --   --      Estimated Creatinine Clearance: 12 mL/min (A) (by C-G formula based on SCr of 4.76 mg/dL (H)).    Allergies  Allergen Reactions   Morphine Rash and Other (See Comments)   Peanut-Containing Drug Products Anaphylaxis and Hives   Penicillins Rash    Rash in 2008.  Tolerated cefazolin in 2020   Chocolate Hives    Antimicrobials this admission: 2/10 vancomycin >>  Meropenem 2/10 >> 2/12 CTX 2/12>>   Hildred Laser, PharmD Clinical Pharmacist **Pharmacist phone directory can now be found on [email or web address] ([email or web address]).  Listed under [place].

## 2021-09-26 ENCOUNTER — Inpatient Hospital Stay (HOSPITAL_COMMUNITY): Payer: Medicare Other

## 2021-09-26 DIAGNOSIS — I6340 Cerebral infarction due to embolism of unspecified cerebral artery: Secondary | ICD-10-CM | POA: Diagnosis not present

## 2021-09-26 DIAGNOSIS — N186 End stage renal disease: Secondary | ICD-10-CM | POA: Diagnosis not present

## 2021-09-26 DIAGNOSIS — Q2112 Patent foramen ovale: Secondary | ICD-10-CM

## 2021-09-26 DIAGNOSIS — L89152 Pressure ulcer of sacral region, stage 2: Secondary | ICD-10-CM

## 2021-09-26 DIAGNOSIS — M869 Osteomyelitis, unspecified: Secondary | ICD-10-CM | POA: Diagnosis not present

## 2021-09-26 DIAGNOSIS — D638 Anemia in other chronic diseases classified elsewhere: Secondary | ICD-10-CM | POA: Diagnosis not present

## 2021-09-26 LAB — CBC WITH DIFFERENTIAL/PLATELET
Abs Immature Granulocytes: 0.08 10*3/uL — ABNORMAL HIGH (ref 0.00–0.07)
Basophils Absolute: 0.1 10*3/uL (ref 0.0–0.1)
Basophils Relative: 1 %
Eosinophils Absolute: 0.5 10*3/uL (ref 0.0–0.5)
Eosinophils Relative: 4 %
HCT: 27.7 % — ABNORMAL LOW (ref 36.0–46.0)
Hemoglobin: 8 g/dL — ABNORMAL LOW (ref 12.0–15.0)
Immature Granulocytes: 1 %
Lymphocytes Relative: 10 %
Lymphs Abs: 1.3 10*3/uL (ref 0.7–4.0)
MCH: 27.6 pg (ref 26.0–34.0)
MCHC: 28.9 g/dL — ABNORMAL LOW (ref 30.0–36.0)
MCV: 95.5 fL (ref 80.0–100.0)
Monocytes Absolute: 0.9 10*3/uL (ref 0.1–1.0)
Monocytes Relative: 7 %
Neutro Abs: 10 10*3/uL — ABNORMAL HIGH (ref 1.7–7.7)
Neutrophils Relative %: 77 %
Platelets: 329 10*3/uL (ref 150–400)
RBC: 2.9 MIL/uL — ABNORMAL LOW (ref 3.87–5.11)
RDW: 26.7 % — ABNORMAL HIGH (ref 11.5–15.5)
WBC: 12.7 10*3/uL — ABNORMAL HIGH (ref 4.0–10.5)
nRBC: 3.3 % — ABNORMAL HIGH (ref 0.0–0.2)

## 2021-09-26 LAB — GLUCOSE, CAPILLARY
Glucose-Capillary: 103 mg/dL — ABNORMAL HIGH (ref 70–99)
Glucose-Capillary: 137 mg/dL — ABNORMAL HIGH (ref 70–99)
Glucose-Capillary: 141 mg/dL — ABNORMAL HIGH (ref 70–99)
Glucose-Capillary: 228 mg/dL — ABNORMAL HIGH (ref 70–99)
Glucose-Capillary: 304 mg/dL — ABNORMAL HIGH (ref 70–99)
Glucose-Capillary: 352 mg/dL — ABNORMAL HIGH (ref 70–99)

## 2021-09-26 LAB — RENAL FUNCTION PANEL
Albumin: 1.6 g/dL — ABNORMAL LOW (ref 3.5–5.0)
Anion gap: 16 — ABNORMAL HIGH (ref 5–15)
BUN: 71 mg/dL — ABNORMAL HIGH (ref 6–20)
CO2: 21 mmol/L — ABNORMAL LOW (ref 22–32)
Calcium: 8.2 mg/dL — ABNORMAL LOW (ref 8.9–10.3)
Chloride: 93 mmol/L — ABNORMAL LOW (ref 98–111)
Creatinine, Ser: 7.22 mg/dL — ABNORMAL HIGH (ref 0.44–1.00)
GFR, Estimated: 7 mL/min — ABNORMAL LOW (ref >60)
Glucose, Bld: 238 mg/dL — ABNORMAL HIGH (ref 70–99)
Phosphorus: 5.5 mg/dL — ABNORMAL HIGH (ref 2.5–4.6)
Potassium: 3.3 mmol/L — ABNORMAL LOW (ref 3.5–5.1)
Sodium: 130 mmol/L — ABNORMAL LOW (ref 135–145)

## 2021-09-26 LAB — PHOSPHORUS: Phosphorus: 5.6 mg/dL — ABNORMAL HIGH (ref 2.5–4.6)

## 2021-09-26 MED ORDER — SODIUM CHLORIDE 0.9 % IV SOLN
2.0000 g | Freq: Two times a day (BID) | INTRAVENOUS | Status: DC
  Administered 2021-09-26 – 2021-09-27 (×2): 2 g via INTRAVENOUS
  Filled 2021-09-26 (×3): qty 20

## 2021-09-26 MED ORDER — IOHEXOL 300 MG/ML  SOLN: 75.0000 mL | Freq: Once | INTRAMUSCULAR | Status: DC | PRN

## 2021-09-26 MED ORDER — IOHEXOL 300 MG/ML  SOLN
100.0000 mL | Freq: Once | INTRAMUSCULAR | Status: AC | PRN
  Administered 2021-09-26: 100 mL via INTRAVENOUS

## 2021-09-26 NOTE — Assessment & Plan Note (Deleted)
There is a stage 2 medial sacrum right and left.

## 2021-09-26 NOTE — Progress Notes (Addendum)
°  Progress Note    09/26/2021 7:33 AM 9 Days Post-Op  Subjective: Speaking but not coherent   Vitals:   09/26/21 0000 09/26/21 0353  BP: 124/78 (!) 143/50  Pulse: 98 81  Resp: 15 12  Temp: 97.6 F (36.4 C) 97.6 F (36.4 C)  SpO2: 100% 99%   Physical Exam: Cardiac:  regular Lungs:  non labored Incisions:  right BKA intact. No fluid collections or hematoma. Tere is skin edge darkening both medially and laterally. Soft. No change from yesterday    CBC    Component Value Date/Time   WBC 14.6 (H) 09/24/2021 0257   RBC 2.92 (L) 09/24/2021 0257   HGB 7.8 (L) 09/24/2021 0257   HGB 10.6 (L) 05/18/2020 0927   HCT 27.6 (L) 09/24/2021 0257   HCT 35.1 05/18/2020 0927   PLT 315 09/24/2021 0257   PLT 186 05/18/2020 0927   MCV 94.5 09/24/2021 0257   MCV 89 05/18/2020 0927   MCH 26.7 09/24/2021 0257   MCHC 28.3 (L) 09/24/2021 0257   RDW 26.4 (H) 09/24/2021 0257   RDW 13.9 05/18/2020 0927   LYMPHSABS 1.2 09/23/2021 0300   LYMPHSABS 1.6 05/18/2020 0927   MONOABS 1.0 09/23/2021 0300   EOSABS 0.2 09/23/2021 0300   EOSABS 0.6 (H) 05/18/2020 0927   BASOSABS 0.1 09/23/2021 0300   BASOSABS 0.0 05/18/2020 0927    BMET    Component Value Date/Time   NA 133 (L) 09/24/2021 0257   NA 133 (L) 05/18/2020 0927   K 3.3 (L) 09/24/2021 0257   CL 93 (L) 09/24/2021 0257   CO2 24 09/24/2021 0257   GLUCOSE 244 (H) 09/24/2021 0257   BUN 36 (H) 09/24/2021 0257   BUN 45 (H) 05/18/2020 0927   CREATININE 4.76 (H) 09/24/2021 0257   CALCIUM 7.5 (L) 09/24/2021 0257   GFRNONAA 11 (L) 09/24/2021 0257   GFRAA 6 (L) 05/18/2020 0927    INR    Component Value Date/Time   INR 1.2 08/10/2021 2212     Intake/Output Summary (Last 24 hours) at 09/26/2021 0733 Last data filed at 09/25/2021 1500 Gross per 24 hour  Intake 233.33 ml  Output --  Net 233.33 ml     Assessment/Plan:  41 y.o. female is s/p right BKA 9 Days Post-Op   Right BKA intact. There is some darkening of the skin on the  stump. This is unchanged from yesterday Will need to continue to monitor   [name], [name] [facility] [phone]  Staple line remains intact despite some discoloration in the stump.  No obvious indication for conversion to an above-knee amputation at this time, however she remains at risk.  [name]

## 2021-09-26 NOTE — Assessment & Plan Note (Deleted)
The patient presented with a non-healing wound from TMA of right foot in 08/2021 due to gangrene of the toes. She was found to have osteomyelitis by MRI. Vascular surgery was consulted. 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

## 2021-09-26 NOTE — Progress Notes (Signed)
STROKE TEAM PROGRESS NOTE   INTERVAL HISTORY  Patient lying comfortably in bed with eyes closed but is is following commands well and moving all r extremities.   .  Patient does not open eyes to voice and speaks only occasional short sentences but will   follow most midline and peripheral commands for me. .  Vital signs stable.  Neurological exam unchanged.  CT abdomen and pelvis yesterday negative for rectovaginal fistula. Vitals:   09/26/21 1100 09/26/21 1130 09/26/21 1200 09/26/21 1308  BP: (!) 137/56 (!) 156/45 (!) 156/45 (!) 151/72  Pulse: 76 62 62 82  Resp: 12 10 15 17   Temp:      TempSrc:    Axillary  SpO2:    99%  Weight:      Height:       CBC:  Recent Labs  Lab 09/23/21 0300 09/24/21 0257 09/26/21 0900  WBC 15.7* 14.6* 12.7*  NEUTROABS 13.1*  --  10.0*  HGB 8.7* 7.8* 8.0*  HCT 29.0* 27.6* 27.7*  MCV 92.9 94.5 95.5  PLT 331 315 213   Basic Metabolic Panel:  Recent Labs  Lab 09/23/21 0300 09/23/21 0734 09/24/21 0257 09/26/21 0728 09/26/21 0900  NA 131*  --  133*  --  130*  K 4.2   < > 3.3*  --  3.3*  CL 92*  --  93*  --  93*  CO2 20*  --  24  --  21*  GLUCOSE 153*  --  244*  --  238*  BUN 53*  --  36*  --  71*  CREATININE 6.68*  --  4.76*  --  7.22*  CALCIUM 7.5*  --  7.5*  --  8.2*  MG 2.3  --  2.3  --   --   PHOS  --    < > 7.0* 5.6* 5.5*   < > = values in this interval not displayed.   Lipid Panel: No results for input(s): CHOL, TRIG, HDL, CHOLHDL, VLDL, LDLCALC in the last 168 hours. HgbA1c: No results for input(s): HGBA1C in the last 168 hours. Urine Drug Screen: No results for input(s): LABOPIA, COCAINSCRNUR, LABBENZ, AMPHETMU, THCU, LABBARB in the last 168 hours.  Alcohol Level No results for input(s): ETH in the last 168 hours.  IMAGING past 24 hours CT ABDOMEN PELVIS W CONTRAST  Result Date: 09/26/2021 CLINICAL DATA:  Abdominal pain, nonlocalized. Evaluate for rectovaginal fistula. EXAM: CT ABDOMEN AND PELVIS WITH CONTRAST TECHNIQUE:  Multidetector CT imaging of the abdomen and pelvis was performed using the standard protocol following bolus administration of intravenous contrast. RADIATION DOSE REDUCTION: This exam was performed according to the departmental dose-optimization program which includes automated exposure control, adjustment of the mA and/or kV according to patient size and/or use of iterative reconstruction technique. CONTRAST:  14mL OMNIPAQUE IOHEXOL 300 MG/ML  SOLN COMPARISON:  None. FINDINGS: Lower chest: Mild atelectasis is noted at the lung bases. There is a slightly nodular opacity at the right lung base measuring 9 mm. Hepatobiliary: There are ill-defined hypodense regions in the left lobe of the liver. The gallbladder is without stones. There is suggestion of mild gallbladder wall thickening versus pericholecystic edema. Mild intrahepatic and extrahepatic biliary ductal dilatation is noted. The common bile duct measures 8 mm in diameter. Pancreas: An ill-defined hypodense region is present in the tail of the pancreas. No surrounding inflammatory changes are identified. Spleen: Multifocal hypodensities are present in the spleen. Adrenals/Urinary Tract: The adrenal glands are within normal limits. Bilateral renal  atrophy is noted. Vascular calcifications are noted at the renal hila bilaterally. No hydronephrosis. There is diffuse bladder wall thickening. Stomach/Bowel: An NG tube is present in the stomach. A rectal tube is in place. The appendix is normal in caliber. No bowel obstruction, free air, or pneumatosis. No definite evidence of rectovaginal fistula. No extravasation of rectal contrast is identified. Vascular/Lymphatic: There are extensive vascular calcifications. No abdominal or pelvic lymphadenopathy. Reproductive: Uterus and bilateral adnexa are within normal limits. Prominent vessels are noted at the uterus which may be associated with pelvic congestion syndrome. Other: Small fat containing umbilical hernia.  No  ascites. Musculoskeletal: Subcutaneous fat stranding is noted in the anterior abdominal wall bilaterally which may be iatrogenic. No acute osseous abnormality. IMPRESSION: 1. No evidence of contrast extravasation from the rectum. No definite evidence of rectovaginal fistula. 2. Diffuse bladder wall thickening, possible infectious or inflammatory cystitis. 3. Aortic atherosclerosis with extensive vascular calcifications. 4. Ill-defined hypodense regions in the spleen, possible splenic infarcts. Comparison with older imaging studies is recommended. 5. Ill-defined areas of low-attenuation in the left lobe of the liver. Differential diagnosis includes focal fatty infiltration versus other abnormality. Multiphase CT is recommended for further evaluation. 6. Intrahepatic and extrahepatic biliary ductal dilatation with suggestion of mild gallbladder wall thickening. Ultrasound is recommended for further evaluation. 7. Bilateral renal atrophy. 8. Mild atelectasis at the lung bases. There is a 9 mm nodular density in the right lower lobe. Comparison with older imaging studies or follow-up is recommended. Electronically Signed   By: Brett Fairy M.D.   On: 09/26/2021 04:10    PHYSICAL EXAM GENERAL: 31 African-American lady laying comfortably in hospital bed, in no acute distress, on dialysis LUNGS: Normal respiratory effort. Non-labored breathing on room air CV: Regular rate and rhythm on telemetry Musculoskeletal : Right below-knee amputation NEURO:  Mental Status: drowsy, does not open eyes to stimuli throughout, does follow a few commands  She does speak a few words and occasional short sentences she does not attempt to name objects or follow commands. Does not answer orientation questions.  Cranial Nerves:  II: Right pupil is nonreactive to direct light, is 3 mm and irregular. The left pupil is 5 mm, irregular, and sluggishly reactive to light.  III, IV, VI: Does not open eyes, does not fixate or  track examiner.  Eyes are dysconjugate with mild right eye exotropia and left eye hyportropia. V: Does not blink to threat throughout   VII: Face appears symmetric resting and with movement.  VIII: Unable to assess due to patient's condition IX, X: Does not cough spontaneously, does not allow for visualization of patient's soft palate XI: Head is grossly midline XII: Does not protrude tongue Motor:  Spontaneous movement in right upper extremity which is purposeful.  Left upper extremity appears to be weaker today but withdraws to noxious stimuli.  Withdraws right lower extremity noxious stimuli but not left lower extremity.  She is able to maintain tone with left lower extremity when flexed at the hip and.  . Right BKA.  Tone and bulk are normal.  Sensation: Localizes to noxious stimuli in bilateral upper extremities.  Coordination: Does not perform   Gait: Deferred  ASSESSMENT/PLAN [name] is a 41 y.o. female with history of ESRD on HD T/Th/Sat, type 2 diabetes mellitus with neuropathy, hypertension, osteomyelitis with BKA, right eye blindness, and medical non-adherence presenting with  lethargy and AMS following HD on 2/3.  Initial work up in the ED revealed patient with leukocytosis concerning  for infection and imaging with concern for osteomyelitis for which vascular surgery recommended admission for BKA on 2/5 and ongoing antibiotics. Of note, patient was discharged in January following urgent transmetatarsal amputation after being found with gangrenous toes s/p wound vac and IV Ancef after wound cultures grew MSSA. During hospitalization, patient's mental status initially improved and she was responding appropriately on evaluation on 2/4 with surgery for BKA on 2/5. On 2/6, she was noted to be somnolent and slow to respond but she was answering questions appropriately. On 2/7 patient had an episode of hypotension with HD but was noted to still be lethargic but followed commands and  answered questions appropriately. Patient again had HD on 2/9 and became more lethargic, patient lethargic on exam during HD on 2/11.  Stroke:  scattered punctate acute infarcts throughout both cerebral  hemispheres likely  embolic -in setting of osteomyelitis consideration for bacterial endocarditis or paradoxical embolism CT head No acute abnormality. CTA head & neck - No large vessel occlusion. Intracranial atherosclerosis including moderate right and mild left ICA stenoses. Widely patent cervical carotid arteries. Mild left V2 vertebral artery stenosis. Scattered small peribronchovascular pulmonary densities in both upper lobes, likely infectious or inflammatory MRI - Scattered punctate acute infarcts throughout both cerebral hemispheres which may be embolic. More confluent region of potentially subacute infarction in the left corona radiata. Chronic ischemia with multiple chronic infarcts as above. 2D Echo EF 70-75%, hyperdynamic LV, evidence of atrial level shunt detected by color-flow Doppler (the bubble study positive) EEG- Mild generalized slowing. This is a nonspecific finding but is consistent with a generalized disturbance of cerebral function and may be seen in toxic, metabolic, post anoxic, multifocal or diffuse structural abnormalities.   Focal slowing is seen intermittently in the right centrotemporal region. This is a finding that may be consistent with a focal disturbance of cerebral function such as her recent finding of b/l ischemic events. No electrographic seizures were recorded.  LDL 97 HgbA1c 10.4 VTE prophylaxis - heparin subq No antithrombotic prior to admission, now on aspirin 300 mg suppository daily.  Therapy recommendations: Pending Disposition: Pending  PFO on echocardiogram Plan for TEE next week LE Venous duplex negative for DVT  Hypertension Home meds:  Losartan Stable BP goal (130-150) Long-term BP goal normotensive Midodrine added due to  hypotension  Hyperlipidemia Home meds: None LDL 97, goal < 70 Start statin when medically appropriate  Diabetes type II Uncontrolled Home meds:  INsulin HgbA1c 10.4, goal < 7.0 CBGs SSI  Other Stroke Risk Factors Hx stroke/TIA Coronary artery disease  Other Active Problems Osteomyelitis BKA 2/5  End stage renal disease HD per nephrology  Cr 6.68 Leukocytosis - management by primary team WBC 15.7 Vancomycin, merrem Malnutrition Dietary/nutrition consult    Hospital day # 11  Patient presented with altered mental status in the setting of osteomyelitis and brain MRI scan showed multiple bilateral cortical and subcortical infarcts with strong possibility of cardiogenic embolism.  Await TEE for endocarditis  .  Encourage oral intake and diet and speech therapy evaluation.    Continue ongoing medical management as per CCM team.  No family available at the bedside for discussion.  Greater than 50% time during this 25-minute visit was spent in counseling and coordination of care and discussion with patient and care team and answering questions.     Antony Contras, MD Medical Director [facility] Pager: [phone] 09/26/2021 2:45 PM  To contact Stroke Continuity provider, please refer to [email or web address] After hours, contact General  Neurology

## 2021-09-26 NOTE — Evaluation (Signed)
Clinical/Bedside Swallow Evaluation Patient Details  Name: [name] MRN: [id number] Date of Birth: 03-May-1981  Today's Date: 09/26/2021 Time: SLP Start Time (ACUTE ONLY): 8338 SLP Stop Time (ACUTE ONLY): 2505 SLP Time Calculation (min) (ACUTE ONLY): 15 min  Past Medical History:  Past Medical History:  Diagnosis Date   Anemia    Anxiety    Blind right eye 2008   Diabetes mellitus without complication ([place])    Dialysis patient ([place])    ESRD (end stage renal disease) ([place])    Dialysis T/Th/Sa   Past Surgical History:  Past Surgical History:  Procedure Laterality Date   AMPUTATION Right 08/11/2021   Procedure: TRANSMETATARSAL AMPUTATION OF RIGHT FOOT;  Surgeon: Serafina Mitchell, MD;  Location: [place];  Service: Vascular;  Laterality: Right;   AMPUTATION Right 09/17/2021   Procedure: AMPUTATION RIGHT BELOW KNEE;  Surgeon: Serafina Mitchell, MD;  Location: [place];  Service: Vascular;  Laterality: Right;   AMPUTATION TOE Left    APPLICATION OF WOUND VAC  08/16/2021   Procedure: APPLICATION OF WOUND VAC;  Surgeon: Serafina Mitchell, MD;  Location: [place] [facility] OR;  Service: Vascular;;   AV FISTULA PLACEMENT     CESAREAN SECTION  2011   FISTULA SUPERFICIALIZATION Right 39/76/7341   Procedure: 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;  Surgeon: Angelia Mould, MD;  Location: [facility];  Service: Vascular;  Laterality: Right;   INSERTION OF DIALYSIS CATHETER Left 07/27/2019   Procedure: INSERTION OF TUNNELED  DIALYSIS CATHETER;  Surgeon: Angelia Mould, MD;  Location: [facility];  Service: Vascular;  Laterality: Left;   LOWER EXTREMITY ANGIOGRAPHY N/A 08/11/2021   Procedure: LOWER EXTREMITY ANGIOGRAPHY;  Surgeon: Serafina Mitchell, MD;  Location: [facility];  Service: Cardiovascular;  Laterality: N/A;   TRANSMETATARSAL AMPUTATION Right 08/16/2021   Procedure: CLOSURE OF RIGHT TRANSMETATARSAL AMPUTATION;  Surgeon: Serafina Mitchell, MD;  Location: [facility];  Service: Vascular;   Laterality: Right;   HPI:  41 y.o. female presented to ED 2/3 with AMS, gangrene RLE and osteomyelitis.  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 Further change in mentation 2/10; MRI brain showed scattered punctate acute infarcts throughout both cerebral hemispheres. Pt was on renal diet with 1200 ml fluid restriction prior to CVA, made NPO 2/11 and large bore NG was placed. 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 Palliative care has been consulted in the setting of multiple acute on chronic disease processes to further discuss goals of care. Swallow eval ordered 2/13.    Assessment / Plan / Recommendation  Clinical Impression  Pt was sleepy but rousable for swallow assessment; RN reported she is more alert than last several days. She kept eyes closed for duration of evaluation; followed simple commands inconsistently.  When offered ice, she began to request it repeatedly, and after most swallows reported that her throat hurt (large bore NG present).  She demonstrated active and energetic mastication of ice with no overt difficulty swallowing. Sips of water from a straw were consumed without concerns for aspiration; clear/good quality voice post-swallow.  Resonance was very hypernasal.  Recommend allowing ice/sips of water tonight. Please D/C large bore NG tomorrow pending further swallowing assessment with  greater variety of food items.  If pt still needs NG, we can replace with cortrak, which will be more comfortable.  Hopefully she can transition back to a PO diet.  D/W RN  and will reach out to Dr. Benny Lennert with plan.  SLP will follow.  SLP Visit Diagnosis: Dysphagia, unspecified (R13.10)    Aspiration Risk    tba   Diet Recommendation   NPO; can have ice chips and sips of water  Medication Administration: Via alternative means    Other  Recommendations Oral  Care Recommendations: Oral care QID    Recommendations for follow up therapy are one component of a multi-disciplinary discharge planning process, led by the attending physician.  Recommendations may be updated based on patient status, additional functional criteria and insurance authorization.  Follow up Recommendations Other (comment) (tba)              Frequency and Duration min 3x week  2 weeks       Prognosis Prognosis for Safe Diet Advancement: Good      Swallow Study   General HPI: 41 y.o. female presented to ED 2/3 with AMS, gangrene RLE and osteomyelitis.  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 Further change in mentation 2/10; MRI brain showed scattered punctate acute infarcts throughout both cerebral hemispheres. Pt was on renal diet with 1200 ml fluid restriction prior to CVA, made NPO 2/11 and large bore NG was placed. 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 Palliative care has been consulted in the setting of multiple acute on chronic disease processes to further discuss goals of care. Swallow eval ordered 2/13. Type of Study: Bedside Swallow Evaluation Previous Swallow Assessment: no Diet Prior to this Study: NPO;NG Tube Temperature Spikes Noted: No Respiratory Status: Room air History of Recent Intubation: No Behavior/Cognition: Alert Oral Cavity Assessment: Within Functional Limits Oral Care Completed by SLP: Recent completion by staff Oral Cavity - Dentition: Adequate natural dentition Vision: Functional for self-feeding Self-Feeding Abilities: Total assist Patient Positioning: Upright in bed Baseline Vocal Quality: Normal Volitional Cough: Weak Volitional Swallow: Able to elicit    Oral/Motor/Sensory Function Overall Oral Motor/Sensory Function:  (unable to assess)   Ice Chips Ice chips: Within functional  limits Presentation: Spoon   Thin Liquid Thin Liquid: Within functional limits    Nectar Thick Nectar Thick Liquid: Not tested   Honey Thick Honey Thick Liquid: Not tested   Puree Puree: Not tested   Solid     Solid: Not tested      Juan Quam Laurice 09/26/2021,3:42 PM  Estill Bamberg L. Tivis Ringer, [place] Office number [phone] Pager [phone]

## 2021-09-26 NOTE — Progress Notes (Addendum)
[facility] Progress Note   Subjective:  no acute events, open for TEE today. Minimally awakens to voice and moaning but not following commands. CT yday neg for rectovaginal fistula  Objective Vitals:   09/25/21 1624 09/25/21 2000 09/26/21 0000 09/26/21 0353  BP: (!) 106/45 (!) 119/51 124/78 (!) 143/50  Pulse: 70 96 98 81  Resp: 13 15 15 12   Temp: (!) 96.6 F (35.9 C) (!) 97.4 F (36.3 C) 97.6 F (36.4 C) 97.6 F (36.4 C)  TempSrc: Axillary Axillary Axillary Oral  SpO2: 100% 100% 100% 99%  Weight:      Height:       Physical Exam HCW:[id number] ill appearing, awakens to voice, not able to answer questions Heart:RRR Lungs:CTAB anterolaterally, no incd WOB Abdomen:soft, NTND Extremities: no LLE edema, R BKA  Dialysis Access: RU AVF+ bruit   Filed Weights   09/23/21 2330 09/24/21 0101 09/25/21 0423  Weight: 56.7 kg 56.7 kg 56.7 kg    Intake/Output Summary (Last 24 hours) at 09/26/2021 0735 Last data filed at 09/25/2021 1500 Gross per 24 hour  Intake 233.33 ml  Output --  Net 233.33 ml    Labs: Basic Metabolic Panel: Recent Labs  Lab 09/21/21 1354 09/21/21 1855 09/22/21 1555 09/22/21 2008 09/23/21 0300 09/23/21 0734 09/23/21 1525 09/24/21 0257  NA 132*  --  133*  --  131*  --   --  133*  K 3.3*   < > 4.4   < > 4.2 4.5  --  3.3*  CL 93*  --  92*  --  92*  --   --  93*  CO2 23  --  23  --  20*  --   --  24  GLUCOSE 93  --  82  --  153*  --   --  244*  BUN 28*  --  44*  --  53*  --   --  36*  CREATININE 3.99*  --  5.93*  --  6.68*  --   --  4.76*  CALCIUM 7.8*  --  7.5*  --  7.5*  --   --  7.5*  PHOS 3.1  --   --   --   --   --  5.9* 7.0*   < > = values in this interval not displayed.     CBC: Recent Labs  Lab 09/21/21 1354 09/22/21 1342 09/23/21 0300 09/24/21 0257  WBC 15.3* 14.5* 15.7* 14.6*  NEUTROABS 12.5* 11.6* 13.1*  --   HGB 8.2* 8.3* 8.7* 7.8*  HCT 27.0* 27.1* 29.0* 27.6*  MCV 89.1 90.9 92.9 94.5  PLT 245 252 331 315    Recent Labs  Lab 09/25/21 1122 09/25/21 1622 09/25/21 2044 09/26/21 0019 09/26/21 0351  GLUCAP 262* 242* 300* 352* 304*   Medications:  sodium chloride     sodium chloride     sodium chloride     sodium chloride     cefTRIAXone (ROCEPHIN)  IV 2 g (09/25/21 2123)   magnesium sulfate bolus IVPB      aspirin  325 mg Per NG tube Daily   calcitRIOL  0.25 mcg Per Tube BID   Chlorhexidine Gluconate Cloth  6 each Topical Q0600   cinacalcet  90 mg Oral Q supper   darbepoetin (ARANESP) injection - DIALYSIS  200 mcg Intravenous Q Thu-HD   dextrose  25 mL Intravenous Once   docusate  100 mg Per Tube Daily   dorzolamide-timolol  1 drop Left Eye BID  feeding supplement (NEPRO CARB STEADY)  1,000 mL Per Tube Q24H   feeding supplement (NEPRO CARB STEADY)  237 mL Per Tube BID BM   feeding supplement (PROSource TF)  45 mL Per Tube BID   free water  30 mL Per Tube Q6H   Gerhardt's butt cream   Topical TID   heparin injection (subcutaneous)  5,000 Units Subcutaneous Q8H   insulin aspart  0-6 Units Subcutaneous Q4H   insulin glargine-yfgn  10 Units Subcutaneous BID   lidocaine-prilocaine  1 application Topical UD   mouth rinse  15 mL Mouth Rinse BID   midodrine  5 mg Per Tube Q8H   multivitamin  1 tablet Per Tube Daily   naproxen  250 mg Per Tube BID WC   pantoprazole sodium  40 mg Per Tube Daily   sevelamer carbonate  1.6 g Per Tube TID WC   traMADol  100 mg Per Tube Q12H      Dialysis Orders: [facility] 3h 22min   400/ 600   59.5kg   2/2 bath  Prof 2   RU AV fistula  Hep 4000  Mircera 200 mcg IV every 2 weeks - last 1/26 Ancef 2 g 3 times weekly HD   Assessment/Plan: Sepsis/ shock - BP's better, off of pressors. Getting empiric IV vanc / meropenem.  Blood cx neg x 1. Looks better, but remains confused  AMS - acute embolic CVA and/or watershed infarcts. TEE pending. IV abx as above. Neuro on board R foot osteomyelitis - non healing TMA.  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 Dispo on hold ESRD  - on HD TTS.  Next HD today 2/14. Try to keep SBP > 120 d/t #2.  Anemia of CKD- Hgb 7.8 on 2/12.  Aranesp 200 with HD on 2/9.  Transfuse pRBC prn MBD ckd - Ca ok, phos up.  Home vdra, sensipar and renvela continued here.  HTN/volume - BP's good today. Added midodrine with HD. UF as tolerated Severe protein calorie malnutrition - Alb 2s.  Cont protein supplements.  Renal diet w/fluid restrictions.  DMT2 - per PMD  Dispo - dc to SNF, per primary, palliative care consulted  Gean Quint, MD [facility]

## 2021-09-26 NOTE — Progress Notes (Signed)
OT Cancellation Note  Patient Details Name: [name] MRN: [id number] DOB: 06-11-1981   Cancelled Treatment:    Reason Eval/Treat Not Completed: Patient at procedure or test/ unavailable (HD. Will return as schedule allows.)  [place], OTR/L Acute Rehab Pager: [phone] Office: [phone] 09/26/2021, 9:19 AM

## 2021-09-26 NOTE — Assessment & Plan Note (Deleted)
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 TEE pending. The patient phonates to stimulation. Moves right upper extremity. Weak in left upper extremity. Possibly no sensation to left lower extremity. She does not speak, open eyes, or follow commands. She appears somnolent. Statin will be started when medically appropriate. She is receiving midodrine for hypotension. She is receiving ASA 300 mg suppository daily. I appreciate neurology's assistance.

## 2021-09-26 NOTE — Consult Note (Signed)
Consult received 12/13 due to concern for possible rectovaginal fistula. Apparent stool like drainage noted from vagina over the weekend. CT AP with rectal contrast ordered yesterday to evaluate for fistula and did not demonstrate this. Further patient still has her uterus which would make a colovaginal fistula or rectovaginal fistula exceedingly unlikely. Patient is noted to be incontinent of stool so perhaps she is not truly draining stool from vagina but just had stool present. Regardless, colovaginal fistula is not a surgical emergency and there is nothing we would recommend acutely. If continued drainage from vagina, consider GYN consult for formal vaginal exam. This patient is also significantly malnourished with acute embolic stroke and ESRD on HD. She is not a favorable surgical candidate and given absence of demonstrated fistula on imaging we will sign off.   Norm Parcel, [facility] Surgery 09/26/2021, 10:58 AM Please see Amion for pager number during day hours 7:00am-4:30pm

## 2021-09-26 NOTE — Assessment & Plan Note (Deleted)
Pt with non-healing wound at site of amputation and osteomyeiltis by MRI.

## 2021-09-26 NOTE — Assessment & Plan Note (Deleted)
Glucoses have been 131-352 in the last 24 hours. The patient's glucoses have been controlled with Lantus 10 units subcutaneous bid and FSBS q 4 hours followed with SSI.

## 2021-09-26 NOTE — Progress Notes (Signed)
This chaplain responded to PMT consult for spiritual care.  This chaplain was updated by the Pt. RN-Kristy before the visit. The chaplain understands the RN will page the chaplain when the Pt. sister-[name] is at the bedside.  Chaplain Sallyanne Kuster [phone]

## 2021-09-26 NOTE — Assessment & Plan Note (Deleted)
The patient is found to have interatrial shunt on TTE. TEE pending to investigate possibility of endocarditis that would be the source of embolic CVA.

## 2021-09-26 NOTE — Progress Notes (Signed)
PROGRESS NOTE  Darria Corvera MVE:[id number] DOB: [birth year] DOA: 09/15/2021 PCP: [facility]  Brief History   41 yo F PMH ESRD on TTS HD, DM2, Neuropathy, HTN, medical non-compliance presented, gangrene RLE to ED 09/15/21 from HD after pt became altered after HD session. Admitted to [facility] for further care. Imaging of R foot obtained which were concerning for osteomyelitis. VVS consulted and pt underwent R BKA 2/5.   Underwent HD 2/9. Volume removal was limited by hypotension. Midodrine added, given albumin   On 2/10 AM pt was more lethargic. Rapid response was called as pt was reportedly minimally responsive to sternal rub. CT H obtained which did not reveal acute intracranial abnormality.    PCCM consulted for AMS in this setting. The patient was found to have suffered   scattered punctate acute infarcts throughout both cerebral hemispheres which may be embolic, subacute infarction in the left corona radiata, chronic ischemia with multiple infarcts. CTA head and neck demonstrated mildl left V2 vertebral artery stenosis with scattered small peribronchovascular pulmonary densities in both upper lobes that are likely inflammatory vs septic emboli.  TTE has demonstrated PFO. TEE is pending.  There was also concern for rectovaginal fistula for which the patient underwent CT abdomen and pelvis that did not support the presence of this fistula. It did suggest possible cholecystitis with intra and extra hepatic ductal dilatation. It also showed a possible pancreatic mass as well as lesions in the right lobe of the liver. Right upper quadrant [place] is pending. She will likely also need multiphase CT of the liver and a dedicated CT of the pancreas.  The patient underwent HD today. Midodrine is used to support SBP of 120 to make is possible to remove volume from the patient.   Consultants  Neurology Nephrology General surgery PCCM Palliative care Wound care Vascular  surgery  Procedures  BKA HD  Antibiotics   Anti-infectives (From admission, onward)    Start     Dose/Rate Route Frequency Ordered Stop   09/26/21 1600  cefTRIAXone (ROCEPHIN) 2 g in sodium chloride 0.9 % 100 mL IVPB        2 g 200 mL/hr over 30 Minutes Intravenous Every 12 hours 09/26/21 1428     09/24/21 1700  cefTRIAXone (ROCEPHIN) 2 g in sodium chloride 0.9 % 100 mL IVPB  Status:  Discontinued        2 g 200 mL/hr over 30 Minutes Intravenous Every 12 hours 09/24/21 0816 09/26/21 1428   09/23/21 1800  meropenem (MERREM) 1 g in sodium chloride 0.9 % 100 mL IVPB  Status:  Discontinued        1 g 200 mL/hr over 30 Minutes Intravenous Every 24 hours 09/22/21 1016 09/24/21 0816   09/23/21 1200  vancomycin (VANCOREADY) IVPB 500 mg/100 mL  Status:  Discontinued        500 mg 100 mL/hr over 60 Minutes Intravenous Every T-Th-Sa (Hemodialysis) 09/22/21 1016 09/25/21 0837   09/22/21 1115  meropenem (MERREM) 1 g in sodium chloride 0.9 % 100 mL IVPB  Status:  Discontinued        1 g 200 mL/hr over 30 Minutes Intravenous Every 24 hours 09/22/21 1015 09/22/21 1016   09/22/21 1115  meropenem (MERREM) 1 g in sodium chloride 0.9 % 100 mL IVPB        1 g 200 mL/hr over 30 Minutes Intravenous  Once 09/22/21 1016 09/22/21 1230   09/22/21 1115  vancomycin (VANCOREADY) IVPB 1250 mg/250 mL  Status:  Discontinued        1,250 mg 166.7 mL/hr over 90 Minutes Intravenous  Once 09/22/21 1016 09/22/21 1029   09/22/21 1115  vancomycin (VANCOCIN) IVPB 1000 mg/200 mL premix        1,000 mg 200 mL/hr over 60 Minutes Intravenous  Once 09/22/21 1029 09/22/21 1421   09/19/21 1200  vancomycin (VANCOREADY) IVPB 500 mg/100 mL  Status:  Discontinued        500 mg 100 mL/hr over 60 Minutes Intravenous Every T-Th-Sa (Hemodialysis) 09/17/21 1046 09/19/21 0759   09/17/21 0743  metroNIDAZOLE (FLAGYL) 500 MG/100ML IVPB       Note to Pharmacy: Nyoka Cowden D: cabinet override      09/17/21 0743 09/17/21 0827    09/16/21 1200  vancomycin (VANCOREADY) IVPB 750 mg/150 mL  Status:  Discontinued        750 mg 150 mL/hr over 60 Minutes Intravenous Every T-Th-Sa (Hemodialysis) 09/15/21 1748 09/17/21 1046   09/15/21 2100  ceFEPIme (MAXIPIME) 1 g in sodium chloride 0.9 % 100 mL IVPB  Status:  Discontinued        1 g 200 mL/hr over 30 Minutes Intravenous Every 24 hours 09/15/21 1957 09/19/21 0759   09/15/21 1800  ceFEPIme (MAXIPIME) 1 g in sodium chloride 0.9 % 100 mL IVPB  Status:  Discontinued        1 g 200 mL/hr over 30 Minutes Intravenous Every 24 hours 09/15/21 1742 09/15/21 1957   09/15/21 1745  metroNIDAZOLE (FLAGYL) tablet 500 mg  Status:  Discontinued        500 mg Oral Every 12 hours 09/15/21 1725 09/19/21 0759   09/15/21 1615  clindamycin (CLEOCIN) IVPB 600 mg        600 mg 100 mL/hr over 30 Minutes Intravenous  Once 09/15/21 1600 09/15/21 1638   09/15/21 1600  vancomycin (VANCOCIN) IVPB 1000 mg/200 mL premix        1,000 mg 200 mL/hr over 60 Minutes Intravenous  Once 09/15/21 1545 09/15/21 1755   09/15/21 1600  clindamycin (CLEOCIN) IVPB 600 mg  Status:  Discontinued        600 mg 100 mL/hr over 30 Minutes Intravenous  Once 09/15/21 1545 09/15/21 1600       Subjective  The patient is lying quietly in bed. She phonates inresponse to stimulation. She does not follow commands or respond verbally to me.  Objective   Vitals:  Vitals:   09/26/21 1308 09/26/21 1512  BP: (!) 151/72 (!) 178/39  Pulse: 82 87  Resp: 17 12  Temp:  (!) 96.7 F (35.9 C)  SpO2: 99% 100%    Exam:  Constitutional:  The patient is lying in bed. She appears somnolent, lethargic. No acute distress. She does not open eyes, respond verbally, or follow commands. Respiratory:  No increased work of breathing. No wheezes, rales, or rhonchi No tactile fremitus Cardiovascular:  Regular rate and rhythm No murmurs, ectopy, or gallups. No lateral PMI. No thrills. Abdomen:  Abdomen is soft, non-tender,  non-distended No hernias, masses, or organomegaly Normoactive bowel sounds.  Musculoskeletal:  No cyanosis, clubbing, or edema Rt lower extremity BKA Swelling of right upper extremity. Skin:  No rashes, lesions, ulcers palpation of skin: no induration or nodules Neurologic:  Unable to perform exam as the patient is unable to cooperate with exam. She does move right upper extremity spontaneously. Psychiatric:  Unable to evaluate as the patient is able to cooperate with exam.   I have personally reviewed the  following:   Today's Data  Vitals  Lab Data  BMP CBC  Micro Data  1/2 blood cultures from 08/10/2021 grew out staph aureus and staph epidermidis  Imaging  CT abdomen and pelvis CTA head and neck CT head without contrast MRI head  Cardiology Data  TTE TEE - pending  Other Data    Scheduled Meds:  aspirin  325 mg Per NG tube Daily   calcitRIOL  0.25 mcg Per Tube BID   Chlorhexidine Gluconate Cloth  6 each Topical Q0600   cinacalcet  90 mg Oral Q supper   darbepoetin (ARANESP) injection - DIALYSIS  200 mcg Intravenous Q Thu-HD   dextrose  25 mL Intravenous Once   docusate  100 mg Per Tube Daily   dorzolamide-timolol  1 drop Left Eye BID   feeding supplement (NEPRO CARB STEADY)  1,000 mL Per Tube Q24H   feeding supplement (NEPRO CARB STEADY)  237 mL Per Tube BID BM   feeding supplement (PROSource TF)  45 mL Per Tube BID   free water  30 mL Per Tube Q6H   Gerhardt's butt cream   Topical TID   heparin injection (subcutaneous)  5,000 Units Subcutaneous Q8H   insulin aspart  0-6 Units Subcutaneous Q4H   insulin glargine-yfgn  10 Units Subcutaneous BID   lidocaine-prilocaine  1 application Topical UD   mouth rinse  15 mL Mouth Rinse BID   midodrine  5 mg Per Tube Q8H   multivitamin  1 tablet Per Tube Daily   naproxen  250 mg Per Tube BID WC   pantoprazole sodium  40 mg Per Tube Daily   sevelamer carbonate  1.6 g Per Tube TID WC   traMADol  100 mg Per Tube  Q12H   Continuous Infusions:  sodium chloride     sodium chloride     sodium chloride     sodium chloride     cefTRIAXone (ROCEPHIN)  IV 2 g (09/26/21 1432)   magnesium sulfate bolus IVPB      Principal Problem:   Osteomyelitis (HCC) Active Problems:   Anemia of chronic disease   Status post amputation of toe of right foot (HCC)   Hyperglycemia due to type 2 diabetes mellitus ([place])   ESRD on hemodialysis ([place])   Cerebral embolism with cerebral infarction   Pressure injury of skin   PFO (patent foramen ovale)   LOS: 11 days   A & P  Assessment and Plan: * Osteomyelitis ([place])- (present on admission) The patient presented with a non-healing wound from TMA of right foot in 08/2021 due to gangrene of the toes. She was found to have osteomyelitis by MRI. Vascular surgery was consulted. 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   PFO (patent foramen ovale) The patient is found to have interatrial shunt on TTE. TEE pending to investigate possibility of endocarditis that would be the source of embolic CVA.  Pressure injury of skin There is a stage 2 medial sacrum right and left.  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 TEE pending. The patient phonates to stimulation. Moves right upper extremity. Weak in left upper extremity. Possibly no sensation to left lower extremity. She does not speak, open eyes, or follow commands. She appears somnolent. Statin will be started when medically appropriate. She is receiving midodrine for hypotension. She is receiving ASA 300 mg suppository daily. I appreciate neurology's assistance.   ESRD on hemodialysis ([place]) Pt on HD  TTS. She had HD this morning. She will be transfused with pRBC's as necessary with dialysis. Midodrine added to attempt to keep SBP greater than 120 and to allow for better volume removal. Pt is on renal diet with fluid restrictions. The  patient is on sensipar and renvela here.   Hyperglycemia due to type 2 diabetes mellitus ([place])- (present on admission) Glucoses have been 131-352 in the last 24 hours. The patient's glucoses have been controlled with Lantus 10 units subcutaneous bid and FSBS q 4 hours followed with SSI.   Status post amputation of toe of right foot ([place]) Pt with non-healing wound at site of amputation and osteomyeiltis by MRI.   Anemia of chronic disease- (present on admission) Anemia of chronic disease. Check FOBT. Monitor hemoglobin and transfuse for symptomatic anemia or hemoglobin less than 7.0.  I have seen and examined this patient myself. I have spent 36 minutes in her evaluation and care.  DVT Prophylaxis: heparin Code Status: Full Code Family Communication: none available Disposition: To be determined   Needham Biggins, DO [facility] Direct contact: see [email or web address]  7PM-7AM contact night coverage as above 09/26/2021, 5:10 PM  LOS: 11 days

## 2021-09-26 NOTE — Assessment & Plan Note (Deleted)
Anemia of chronic disease. Check FOBT. Monitor hemoglobin and transfuse for symptomatic anemia or hemoglobin less than 7.0.

## 2021-09-26 NOTE — Assessment & Plan Note (Deleted)
Pt on HD TTS. She had HD this morning. She will be transfused with pRBC's as necessary with dialysis. Midodrine added to attempt to keep SBP greater than 120 and to allow for better volume removal. Pt is on renal diet with fluid restrictions. The patient is on sensipar and renvela here.

## 2021-09-27 ENCOUNTER — Inpatient Hospital Stay (HOSPITAL_COMMUNITY): Payer: Medicare Other

## 2021-09-27 DIAGNOSIS — Z7189 Other specified counseling: Secondary | ICD-10-CM | POA: Diagnosis not present

## 2021-09-27 DIAGNOSIS — I6340 Cerebral infarction due to embolism of unspecified cerebral artery: Secondary | ICD-10-CM | POA: Diagnosis not present

## 2021-09-27 DIAGNOSIS — Z515 Encounter for palliative care: Secondary | ICD-10-CM | POA: Diagnosis not present

## 2021-09-27 DIAGNOSIS — G9341 Metabolic encephalopathy: Secondary | ICD-10-CM | POA: Diagnosis not present

## 2021-09-27 DIAGNOSIS — M869 Osteomyelitis, unspecified: Secondary | ICD-10-CM | POA: Diagnosis not present

## 2021-09-27 LAB — COMPREHENSIVE METABOLIC PANEL
ALT: 33 U/L (ref 0–44)
AST: 58 U/L — ABNORMAL HIGH (ref 15–41)
Albumin: 1.9 g/dL — ABNORMAL LOW (ref 3.5–5.0)
Alkaline Phosphatase: 110 U/L (ref 38–126)
Anion gap: 13 (ref 5–15)
BUN: 39 mg/dL — ABNORMAL HIGH (ref 6–20)
CO2: 25 mmol/L (ref 22–32)
Calcium: 8.4 mg/dL — ABNORMAL LOW (ref 8.9–10.3)
Chloride: 96 mmol/L — ABNORMAL LOW (ref 98–111)
Creatinine, Ser: 5.35 mg/dL — ABNORMAL HIGH (ref 0.44–1.00)
GFR, Estimated: 10 mL/min — ABNORMAL LOW (ref >60)
Glucose, Bld: 132 mg/dL — ABNORMAL HIGH (ref 70–99)
Potassium: 3.8 mmol/L (ref 3.5–5.1)
Sodium: 134 mmol/L — ABNORMAL LOW (ref 135–145)
Total Bilirubin: 0.7 mg/dL (ref 0.3–1.2)
Total Protein: 7.8 g/dL (ref 6.5–8.1)

## 2021-09-27 LAB — CBC WITH DIFFERENTIAL/PLATELET
Abs Immature Granulocytes: 0.07 10*3/uL (ref 0.00–0.07)
Basophils Absolute: 0.1 10*3/uL (ref 0.0–0.1)
Basophils Relative: 1 %
Eosinophils Absolute: 0.4 10*3/uL (ref 0.0–0.5)
Eosinophils Relative: 3 %
HCT: 32 % — ABNORMAL LOW (ref 36.0–46.0)
Hemoglobin: 9.2 g/dL — ABNORMAL LOW (ref 12.0–15.0)
Immature Granulocytes: 1 %
Lymphocytes Relative: 15 %
Lymphs Abs: 1.9 10*3/uL (ref 0.7–4.0)
MCH: 27.7 pg (ref 26.0–34.0)
MCHC: 28.8 g/dL — ABNORMAL LOW (ref 30.0–36.0)
MCV: 96.4 fL (ref 80.0–100.0)
Monocytes Absolute: 1.3 10*3/uL — ABNORMAL HIGH (ref 0.1–1.0)
Monocytes Relative: 11 %
Neutro Abs: 8.9 10*3/uL — ABNORMAL HIGH (ref 1.7–7.7)
Neutrophils Relative %: 69 %
Platelets: 281 10*3/uL (ref 150–400)
RBC: 3.32 MIL/uL — ABNORMAL LOW (ref 3.87–5.11)
RDW: 26.9 % — ABNORMAL HIGH (ref 11.5–15.5)
WBC: 12.6 10*3/uL — ABNORMAL HIGH (ref 4.0–10.5)
nRBC: 2 % — ABNORMAL HIGH (ref 0.0–0.2)

## 2021-09-27 LAB — GLUCOSE, CAPILLARY
Glucose-Capillary: 121 mg/dL — ABNORMAL HIGH (ref 70–99)
Glucose-Capillary: 142 mg/dL — ABNORMAL HIGH (ref 70–99)
Glucose-Capillary: 145 mg/dL — ABNORMAL HIGH (ref 70–99)
Glucose-Capillary: 171 mg/dL — ABNORMAL HIGH (ref 70–99)
Glucose-Capillary: 241 mg/dL — ABNORMAL HIGH (ref 70–99)
Glucose-Capillary: 254 mg/dL — ABNORMAL HIGH (ref 70–99)

## 2021-09-27 LAB — CULTURE, BLOOD (ROUTINE X 2): Culture: NO GROWTH

## 2021-09-27 LAB — PHOSPHORUS: Phosphorus: 4.6 mg/dL (ref 2.5–4.6)

## 2021-09-27 MED ORDER — NEPRO/CARBSTEADY PO LIQD
1000.0000 mL | ORAL | Status: DC
  Administered 2021-09-27 – 2021-09-30 (×4): 1000 mL
  Filled 2021-09-27 (×4): qty 1000

## 2021-09-27 NOTE — Hospital Course (Addendum)
[name] is a 41 y.o. female with medical history significant for poorly controlled DMT2, ESRD on TTS HD, HTN with poor compliance with medical treatment and HD was sent after completing dialysis session due to staff concern for altered mental status.  She has a recent history of TMA of the right foot about 2 months ago, grew MSSA and was on Ancef with dialysis.  Vascular surgery as well as nephrology consulted.  Hospital course complicated by an episode of lethargy, minimal responsiveness on 2/10 requiring rapid response.  An MRI of the brain showed scattered punctate acute infarcts bilaterally.  Neurology consulted.  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  Neurology recommends Plex, HD catheter placed this morning

## 2021-09-27 NOTE — Progress Notes (Signed)
Received a call from [place] with [facility] in regards to referral for out-pt HD needs for snf placement. Pt was not accepted at [facility] by one provider. Kim requested that navigator f/u with CSW to see if snf can transport to [facility] since pt is a [employer] pt. Will discuss with CSW tomorrow.   Melven Sartorius Renal Navigator [phone]

## 2021-09-27 NOTE — Progress Notes (Addendum)
Physical Therapy Treatment Patient Details Name: [name] MRN: [id number] DOB: [birth year] Today's Date: 09/27/2021   History of Present Illness Villa Burgin is a 41 y.o. female who was sent after completing dialysis session 09/15/21 due to staff concern for altered mental status.  She was not speaking to them. 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    PT Comments    Pt continues to display deficits in cognition with significant delayed processing. Pt benefits from multi-modal simple cues to guide all movements. Pt with no muscle activation noted at her L quads, gastrocs, or anterior tibialis. Pt only displaying AROM through her hip at her L leg. Pt also with poor core control, displaying LOB in all directions (more so to L) sitting EOB. However, pt was able to progress from requiring maxA to sit statically EOB to brief moments of min guard assist before LOB requiring min-maxA to recover again. Will continue to follow acutely. Current recommendations remain appropriate.    Recommendations for follow up therapy are one component of a multi-disciplinary discharge planning process, led by the attending physician.  Recommendations may be updated based on patient status, additional functional criteria and insurance authorization.  Follow Up Recommendations  Skilled nursing-short term rehab (<3 hours/day)     Assistance Recommended at Discharge Frequent or constant Supervision/Assistance  Patient can return home with the following Two people to help with walking and/or  transfers;Two people to help with bathing/dressing/bathroom;A lot of help with bathing/dressing/bathroom;A lot of help with walking and/or transfers;Assistance with cooking/housework;Assistance with feeding;Direct supervision/assist for medications management;Direct supervision/assist for financial management;Assist for transportation;Help with stairs or ramp for entrance   Equipment Recommendations  BSC/3in1;Wheelchair (measurements PT);Wheelchair cushion (measurements PT);Hospital bed;Other (comment) (hoyer lift)    Recommendations for Other Services       Precautions / Restrictions Precautions Precautions: Fall Precaution Comments: limb protector RLE; mittens bil Restrictions Weight Bearing Restrictions: Yes RLE Weight Bearing: Non weight bearing     Mobility  Bed Mobility Overal bed mobility: Needs Assistance Bed Mobility: Supine to Sit, Sit to Supine, Rolling Rolling: Mod assist   Supine to sit: Max assist, +2 for physical assistance, HOB elevated Sit to supine: Total assist, +2 for physical assistance   General bed mobility comments: able to inititate coming to EOB with multimodal cueing and AAROM to bring legs off EOB and hand-over-hand guidance of UEs to bed rail to pull on. assist for all aspects. Fatigued at end of session needing total A +2 to return to supine. Hand-over-hand guidance to bed rails to pull on to roll, modA to bring L leg fully across to roll to R.    Transfers                   General transfer comment: unable to attempt d/t lethargy and zero sitting balance    Ambulation/Gait               General Gait Details: Unable   Stairs             Wheelchair Mobility    Modified Rankin (Stroke Patients Only) Modified Rankin (Stroke Patients Only)  Pre-Morbid Rankin Score: No symptoms Modified Rankin: Severe disability     Balance Overall balance assessment: Needs assistance Sitting-balance support: Bilateral upper extremity  supported, No upper extremity supported, Single extremity supported Sitting balance-Leahy Scale: Zero Sitting balance - Comments: mostly maxA for sitting balance, poor truncal control with loss of balance in all directions, more so to the L. Briefly able to achieve a 2-3 seconds of min guard assist level but quickly loses balance. Cues provided for anterior pelvic tilt and thoracic extension, momentary success.       Standing balance comment: unable                            Cognition Arousal/Alertness: Lethargic Behavior During Therapy: Flat affect Overall Cognitive Status: Impaired/Different from baseline Area of Impairment: Orientation, Attention, Following commands, Safety/judgement, Awareness, Problem solving, Memory                 Orientation Level: Situation Current Attention Level: Focused Memory: Decreased recall of precautions, Decreased short-term memory Following Commands: Follows one step commands inconsistently, Follows one step commands with increased time Safety/Judgement: Decreased awareness of safety, Decreased awareness of deficits Awareness: Intellectual Problem Solving: Slow processing, Decreased initiation, Difficulty sequencing, Requires verbal cues, Requires tactile cues General Comments: Pt able to state "hospital" for place. "February" "2023." for situation she responded "my leg." Lethargic with eyes closed throughout session. When asked to open her eyes pt would reach with her hands to manual open each eyelid. Did note drainage in L eye causing her to not be able to fully open but right eye was clear. Pt was significantly delayed responses and needing questions repeated a second time. Hospital bracelet falling of pt's hand and once brought to her attention she initiated and was able to correct it. Skipped the number 8 when counting to 10 with PT. Benefits from extensive multi-modal simple cues        Exercises General Exercises - Lower  Extremity Hip ABduction/ADduction: AAROM, Strengthening, Both, 10 reps, Seated Other Exercises Other Exercises: anterior pelvic tilt and thoracic extension sitting EOB    General Comments General comments (skin integrity, edema, etc.): VSS on RA; noted no muscle activation in L quads, gastrocs, or anterior tibialis, but AROM noted in hip      Pertinent Vitals/Pain Pain Assessment Pain Assessment: Faces Faces Pain Scale: Hurts even more Pain Location: "back" after sitting EOB a few minutes. also grimacing and moaning in pain during pericare. Pain Descriptors / Indicators: Grimacing, Moaning, Sore Pain Intervention(s): Monitored during session, Limited activity within patient's tolerance, Repositioned, Other (comment) (applied barrier cream)    Home Living                          Prior Function            PT Goals (current goals can now be found in the care plan section) Acute Rehab PT Goals Patient Stated Goal: agreeable to session PT Goal Formulation: With patient Time For Goal Achievement: 10/02/21 Potential to Achieve Goals: Fair Progress towards PT goals: Progressing toward goals    Frequency    Min 3X/week      PT Plan Equipment recommendations need to be updated    Co-evaluation PT/OT/SLP Co-Evaluation/Treatment: Yes Reason for Co-Treatment: For patient/therapist safety;Necessary to address cognition/behavior during functional activity;To address functional/ADL transfers;Complexity of the patient's impairments (multi-system involvement) PT goals addressed during session: Mobility/safety with mobility;Balance;Strengthening/ROM OT goals addressed  during session: ADL's and self-care;Strengthening/ROM      AM-PAC PT "6 Clicks" Mobility   Outcome Measure  Help needed turning from your back to your side while in a flat bed without using bedrails?: Total Help needed moving from lying on your back to sitting on the side of a flat bed without using  bedrails?: Total Help needed moving to and from a bed to a chair (including a wheelchair)?: Total Help needed standing up from a chair using your arms (e.g., wheelchair or bedside chair)?: Total Help needed to walk in hospital room?: Total Help needed climbing 3-5 steps with a railing? : Total 6 Click Score: 6    End of Session   Activity Tolerance: Patient limited by lethargy Patient left: in bed;with call bell/phone within reach;with bed alarm set Nurse Communication: Mobility status;Other (comment) (wounds at buttocks, eyes won't open) PT Visit Diagnosis: Other abnormalities of gait and mobility (R26.89);Muscle weakness (generalized) (M62.81);Difficulty in walking, not elsewhere classified (R26.2);Other symptoms and signs involving the nervous system (R29.898) Pain - Right/Left: Right Pain - part of body: Leg     Time: 0761-5183 PT Time Calculation (min) (ACUTE ONLY): 37 min  Charges:  $Neuromuscular Re-education: 8-22 mins                     Moishe Spice, PT, DPT Acute Rehabilitation Services  Pager: [phone] Office: [phone]    Orvan Falconer 09/27/2021, 12:03 PM

## 2021-09-27 NOTE — Progress Notes (Signed)
Progress Note   Patient: [name] FTD:[id number] DOB: 1981-03-07 DOA: 09/15/2021     12 DOS: the patient was seen and examined on 09/27/2021   Brief hospital course: [name] is a 41 y.o. female with medical history significant for poorly controlled DMT2, ESRD with HD on Tu,Th, Sat, neuropathy, HTN with poor compliance with medical treatment and HD was sent after completing dialysis session today due to staff concern for altered mental status because she was not speaking to them and "lethargy" per Triage note. 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 Grew MSSA and was receiving Ancef with dialysis.  In the ED, the patient initially acted as if she was unable to hear the admitting physician but then began to speak with her. Per history, her mental status had improved and no confusion was noted.  Xray of the right stump was suggestive of osteomyelitis.   2/4 Vascular surgery recommended a BKA and patient agreed.  2/5 underwent right BKA 2/6 appeared sleepy (kept eyes closed) 2/8 somnolent again on exam 2/9 still somnolent- volume removal limited by hypotension- Midodrine given  2/10 rapid response called for lethargy, minimally responsive to sternal rub- PCCM consulted, Neuro consulted - MRI obtained> scattered punctate acute infarcts bilaterally and confluen region in the left corona radiata. - CTA of head and neck: No large vessel occlusion. -  EEG >diffuse encephalopathy in addition to focal disturbance of cortical function in the right centrotemporal region - started on empiric antibiotics for possible endocarditis, ASA 325 mg - given pressors overnight  - 2/11- ECHO with bubble study > Agitated saline contrast bubble study was positive with shunting observed  within 3-6 cardiac cycles suggestive of interatrial shunt. , possible bicuspid aortic valve - NG tube placed and feeds started  2/12- more awake, not following commands  2/14-  Concerns for rectal vaginal fistula- CT abd/pelvis with rectal contrast was negative- Intrahepatic and extrahepatic biliary ductal dilatation with suggestion of mild gallbladder wall thickening - [place] abd showed a CBD of 9.8 mm  2/15- NG removed for SLP eval- D 2 diet with thin liquids recommended but due to waxing and waning mental status, a cor trak was placed  Assessment and Plan: * Osteomyelitis ([place])- (present on admission) - The patient underwent Right BKA on 09/17/2021.   PFO (patent foramen ovale) The patient is found to have interatrial shunt on TTE -  TEE pending - I called cardiology for this today  Acute metabolic encephalopathy  - multifocal CVAs though to be secondary to embolism vs endocarditis - TTE bubble study + - TEE pending  - on ASA 325 mg via tube - dc Tramadol 100 mg BID (started on 2/11) and follow mental status - blood cultures from 2/10 are negative- dc Ceftriaxone   ESRD on hemodialysis (HCC) Pt on HD TTS   Hypotension - On Midodrine TID  Hyperglycemia due to type 2 diabetes mellitus ([place])- (present on admission) Hemoglobin A1C    Component Value Date/Time   HGBA1C 10.4 (H) 08/11/2021 0415   The patient's glucoses have been controlled with Semglee 10 units subcutaneous bid and q 4 hours SSI.   Nutrition - starting nephro carb today    Pressure injury of skin There is a stage 2 medial sacrum right and left.     Subjective: she is nonverbal  Physical Exam: Vitals:   09/27/21 0300 09/27/21 0400 09/27/21 0505 09/27/21 0730  BP: (!) 131/38 (!) 125/59  (!) 153/31  Pulse:  80 86  93  Resp: 15 18    Temp:  98.6 F (37 C)    TempSrc:  Oral    SpO2: 98% 100%  99%  Weight:   58.9 kg   Height:       General exam: Appears comfortable but lethargic, has mitts on hands HEENT:   no sclera icterus or thrush Respiratory system: Clear to auscultation. Respiratory effort normal. Cardiovascular system: S1 & S2 heard, regular rate and  rhythm Gastrointestinal system: Abdomen soft, non-tender, nondistended. Normal bowel sounds   Central nervous system: unresponsive. Extremities: No cyanosis, clubbing or edema Skin: No rashes or ulcers  .    Data Reviewed:  reveiwed  Family Communication: none  Disposition: Status is: Inpatient  Remains inpatient appropriate because: acute metabolic encephalopathy    Planned Discharge Destination:  TBD     Time spent: 45 minutes  Author: Debbe Odea, MD 09/27/2021 1:47 PM  For on call review [email or web address].

## 2021-09-27 NOTE — Plan of Care (Signed)
°  Problem: Health Behavior/Discharge Planning: Goal: Ability to manage health-related needs will improve Outcome: Progressing   Problem: Pain Managment: Goal: General experience of comfort will improve Outcome: Progressing   Problem: Safety: Goal: Ability to remain free from injury will improve Outcome: Progressing   Problem: Skin Integrity: Goal: Demonstration of wound healing without infection will improve Outcome: Progressing

## 2021-09-27 NOTE — Progress Notes (Signed)
Occupational Therapy Treatment Patient Details Name: [name] MRN: [id number] DOB: 1981/03/07 Today's Date: 09/27/2021   History of present illness [name] is a 41 y.o. female who was sent after completing dialysis session 09/15/21 due to staff concern for altered mental status.  She was not speaking to them. 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   OT comments  Pt progressing slow but steady towards acute OT goals. Able to tolerate sitting EOB for several minutes mostly at max A level but occasionally able to briefly achieve min guard. Pt lethargic and with eyes closed throughout session. Able to answer basic questions inconsistently with a fair degree of accuracy/appropriateness. Of note, when asked to open her eyes, pt consistently would manual try to open each eyelid. Full session details below. D/c plan remains appropriate.    Recommendations for follow up therapy are one component of a multi-disciplinary discharge planning process, led by the attending physician.  Recommendations may be updated based on patient status, additional functional criteria and insurance authorization.    Follow Up Recommendations  Skilled nursing-short term rehab (<3 hours/day)    Assistance Recommended at Discharge Frequent or constant Supervision/Assistance  Patient can return home with the following  Two people to help with bathing/dressing/bathroom;Two people to help with walking and/or transfers;Assistance with cooking/housework;Assistance with feeding;Help with stairs or ramp for entrance;Assist for transportation;Direct supervision/assist for financial management;Direct supervision/assist for medications management   Equipment Recommendations  Other (comment) (defer to next  venue)    Recommendations for Other Services      Precautions / Restrictions Precautions Precautions: Fall Precaution Comments: limb protector RLE Restrictions Weight Bearing Restrictions: Yes RLE Weight Bearing: Non weight bearing       Mobility Bed Mobility Overal bed mobility: Needs Assistance Bed Mobility: Supine to Sit, Sit to Supine Rolling: Mod assist   Supine to sit: Max assist, +2 for physical assistance, HOB elevated Sit to supine: Total assist, +2 for physical assistance   General bed mobility comments: able to inititate coming to EOB with multimodal cueing. assist for all aspects. Fatigued at end of session needing total A +2 to return to supine.    Transfers                   General transfer comment: unable to attempt d/t lethargy and zero sitting balance     Balance Overall balance assessment: Needs assistance Sitting-balance support: Bilateral upper extremity supported, No upper extremity supported, Single extremity supported Sitting balance-Leahy Scale: Zero Sitting balance - Comments: mostly maxA for sitting balance, poor truncal control with loss of balance in all directions. Briefly able to achieve a 2-3 seconds of min guard assist level but quickly loses balance.                                   ADL either performed or assessed with clinical judgement   ADL Overall ADL's : Needs assistance/impaired     Grooming: Wash/dry face;Maximal assistance;Sitting Grooming Details (indicate cue type and reason): needs supported seating. max A for throughness. able to reach face with washcloth. holds washcloth with loose grip.  General ADL Comments: Pt tolerated sitting EOB for several minutes. Worked on trunk control, head/neck posture, righting reactions, core strength.    Extremity/Trunk Assessment Upper Extremity Assessment Upper Extremity Assessment: Generalized weakness;RUE  deficits/detail;Difficult to assess due to impaired cognition RUE Deficits / Details: edema noted througout. 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, kept both eyes closed 99% of session   Perception Perception Perception: Impaired   Praxis Praxis Praxis: Impaired Praxis Impairment Details: Initiation;Motor planning    Cognition Arousal/Alertness: Lethargic Behavior During Therapy: Flat affect Overall Cognitive Status: Impaired/Different from baseline Area of Impairment: Orientation, Attention, Following commands, Safety/judgement, Awareness, Problem solving, Memory                 Orientation Level: Situation Current Attention Level: Focused Memory: Decreased recall of precautions, Decreased short-term memory Following Commands: Follows one step commands inconsistently, Follows one step commands with increased time Safety/Judgement: Decreased awareness of safety, Decreased awareness of deficits Awareness: Intellectual Problem Solving: Slow processing, Decreased initiation, Difficulty sequencing, Requires verbal cues, Requires tactile cues General Comments: Pt able to state "hospital" for place. "February" "2023." for situation she responded "my leg." Lethargic with eyes closed throughout session. When asked to open her eyes pt would reach with her hands to manual open each eyelid. Did note drainage in L eye causing her to not be able to fully open but right eye was clear. Pt was significantly delayed responses and needing questions repeated a second time. Hospital bracelet falling of pt's hand and once brought to her attention she initiated and was able to correct it.        Exercises      Shoulder  Instructions       General Comments      Pertinent Vitals/ Pain       Pain Assessment Pain Assessment: Faces Faces Pain Scale: Hurts even more Pain Location: "back" after sitting EOB a few minutes. also grimacing and moaning in pain during pericare. Pain Descriptors / Indicators: Grimacing, Moaning, Sore Pain Intervention(s): Monitored during session, Limited activity within patient's tolerance, Repositioned, Other (comment) (applied barrier cream)  Home Living                                          Prior Functioning/Environment              Frequency  Min 2X/week        Progress Toward Goals  OT Goals(current goals can now be found in the care plan section)  Progress towards OT goals: Progressing toward goals (slowly)  Acute Rehab OT Goals Patient Stated Goal: not stated OT Goal Formulation: Patient unable to participate in goal setting Time For Goal Achievement: 10/02/21 Potential to Achieve Goals: Fair ADL Goals Pt Will Perform Grooming: with set-up;with supervision;sitting Pt Will Perform Upper Body Bathing: with set-up;with supervision;sitting Pt Will Perform Lower Body Bathing: with min assist;sit to/from stand Pt Will Perform Upper Body Dressing: with supervision;with set-up;sitting Pt Will Perform Lower Body Dressing: with min assist;sit to/from stand Pt Will Transfer to Toilet: with min assist;stand pivot transfer;squat pivot transfer;bedside commode Pt Will Perform Toileting - Clothing Manipulation and hygiene: sitting/lateral  leans;with caregiver independent in assisting;with modified independence Additional ADL Goal #1: Pt will be S to come up to sit EOB Additional ADL Goal #2: Pt will be alert and oriented x4 without cues  Plan Discharge plan remains appropriate    Co-evaluation    PT/OT/SLP Co-Evaluation/Treatment: Yes Reason for Co-Treatment: For patient/therapist safety;Necessary to address cognition/behavior during  functional activity;To address functional/ADL transfers;Complexity of the patient's impairments (multi-system involvement)   OT goals addressed during session: ADL's and self-care;Strengthening/ROM      AM-PAC OT "6 Clicks" Daily Activity     Outcome Measure   Help from another person eating meals?: Total Help from another person taking care of personal grooming?: Total Help from another person toileting, which includes using toliet, bedpan, or urinal?: Total Help from another person bathing (including washing, rinsing, drying)?: Total Help from another person to put on and taking off regular upper body clothing?: Total Help from another person to put on and taking off regular lower body clothing?: Total 6 Click Score: 6    End of Session    OT Visit Diagnosis: Other abnormalities of gait and mobility (R26.89);Unsteadiness on feet (R26.81);Muscle weakness (generalized) (M62.81);Other symptoms and signs involving cognitive function;Low vision, both eyes (H54.2)   Activity Tolerance Patient limited by lethargy   Patient Left in bed;with call bell/phone within reach;with bed alarm set   Nurse Communication Mobility status;Need for lift equipment;Other (comment) (drainage in Left eye. cognition. applied barrier cream during pericare)        Time: 3142-7670 OT Time Calculation (min): 37 min  Charges: OT General Charges $OT Visit: 1 Visit OT Treatments $Self Care/Home Management : 8-22 mins  Tyrone Schimke, OT Acute Rehabilitation Services Office: [phone]   Hortencia Pilar 09/27/2021, 10:13 AM

## 2021-09-27 NOTE — Plan of Care (Signed)
  Problem: Clinical Measurements: Goal: Ability to maintain clinical measurements within normal limits will improve Outcome: Progressing   Problem: Clinical Measurements: Goal: Ability to maintain clinical measurements within normal limits will improve Outcome: Progressing   

## 2021-09-27 NOTE — Progress Notes (Addendum)
°  Progress Note    09/27/2021 10:36 AM 10 Days Post-Op  Subjective:  no complaints   Vitals:   09/27/21 0400 09/27/21 0730  BP: (!) 125/59 (!) 153/31  Pulse: 86 93  Resp: 18   Temp: 98.6 F (37 C)   SpO2: 100% 99%   Physical Exam: Cardiac:  regular Lungs:  non labored Incisions:  right BKA staples intact. Darkening of anterior and posterior flaps unchanged Neurologic: alert  CBC    Component Value Date/Time   WBC 12.6 (H) 09/27/2021 0129   RBC 3.32 (L) 09/27/2021 0129   HGB 9.2 (L) 09/27/2021 0129   HGB 10.6 (L) 05/18/2020 0927   HCT 32.0 (L) 09/27/2021 0129   HCT 35.1 05/18/2020 0927   PLT 281 09/27/2021 0129   PLT 186 05/18/2020 0927   MCV 96.4 09/27/2021 0129   MCV 89 05/18/2020 0927   MCH 27.7 09/27/2021 0129   MCHC 28.8 (L) 09/27/2021 0129   RDW 26.9 (H) 09/27/2021 0129   RDW 13.9 05/18/2020 0927   LYMPHSABS 1.9 09/27/2021 0129   LYMPHSABS 1.6 05/18/2020 0927   MONOABS 1.3 (H) 09/27/2021 0129   EOSABS 0.4 09/27/2021 0129   EOSABS 0.6 (H) 05/18/2020 0927   BASOSABS 0.1 09/27/2021 0129   BASOSABS 0.0 05/18/2020 0927    BMET    Component Value Date/Time   NA 134 (L) 09/27/2021 0129   NA 133 (L) 05/18/2020 0927   K 3.8 09/27/2021 0129   CL 96 (L) 09/27/2021 0129   CO2 25 09/27/2021 0129   GLUCOSE 132 (H) 09/27/2021 0129   BUN 39 (H) 09/27/2021 0129   BUN 45 (H) 05/18/2020 0927   CREATININE 5.35 (H) 09/27/2021 0129   CALCIUM 8.4 (L) 09/27/2021 0129   GFRNONAA 10 (L) 09/27/2021 0129   GFRAA 6 (L) 05/18/2020 0927    INR    Component Value Date/Time   INR 1.2 08/10/2021 2212     Intake/Output Summary (Last 24 hours) at 09/27/2021 1036 Last data filed at 09/26/2021 1225 Gross per 24 hour  Intake --  Output 1125 ml  Net -1125 ml     Assessment/Plan:  41 y.o. female is s/p right BKA 10 Days Post-Op   Right BKA unchanged. Darkening of flaps. No acute need for revision. Will need to continue to monitor   Tracey Caldwell, PA-C [facility] [phone] 09/27/2021 10:36 AM  I agree with the above.  We will continue to monitor the incision closely  [name]

## 2021-09-27 NOTE — Progress Notes (Signed)
Speech Language Pathology Treatment: Dysphagia  Patient Details Name: [name] MRN: [id number] DOB: [birth year] Today's Date: 09/27/2021 Time: 0998-3382 SLP Time Calculation (min) (ACUTE ONLY): 41 min  Assessment / Plan / Recommendation Clinical Impression  Pt seen to day to address dysphagia goals. RN removed NG tube per MD approval - which pt reported improved her comfort.  Pt advises she would like to eat/drink. She requires max tactile cues to help hold her cup with hand over hand. .   Baseline and after intake occasional wet vocal quality noted. Cues to clear throat and swallow rectified wet vocal quality. Pt demonstrated adequate attention for po trials - consuming 7 ice chips, 3 graham crackers and 2 ounces of applesauce.  She demonstrated adequate mastication with meltable solids (graham cracker) without oral retention.  Swallow clinically judged to be occasionally delayed without negative vital changes. SLP does not advise pt have regular diet due to her mentation and very LOOSE two front teeth discovered when SLP was brushing pt's teeth.   Recommend dys2/thin - with strict precautions.  Oral suction set up is indicated to use to clear retained secretions if needed but pt will swallow if cued.  Pt is drowsy but will awaken to accept po intake.  At this time, recommend pt initiate a dys2/thin diet with strict precautions.  Uncertain if pt will be able to maintain nutrition given her mentation, but she was readily wanting more po intake.     HPI HPI: 41 y.o. female presented to ED 2/3 with AMS, gangrene RLE and osteomyelitis.  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 Further change in mentation 2/10; MRI brain showed scattered punctate acute infarcts throughout both cerebral hemispheres. Pt was on renal diet with 1200 ml fluid restriction prior to CVA, made NPO 2/11 and large bore NG was placed. PMHx ESRD on HD T/Th/Sat, type 2 diabetes mellitus with neuropathy, hypertension, right eye blindness, right  transmetarsal amputation 08/16/21, medical non-compliance,  hypoalbuminemia & malnutrition. Palliative care has been consulted in the setting of multiple acute on chronic disease processes to further discuss goals of care. Swallow eval ordered 2/13.      SLP Plan  Continue with current plan of care      Recommendations for follow up therapy are one component of a multi-disciplinary discharge planning process, led by the attending physician.  Recommendations may be updated based on patient status, additional functional criteria and insurance authorization.    Recommendations  Diet recommendations: Dysphagia 2 (fine chop);Thin liquid Liquids provided via: Cup;Straw Medication Administration: Whole meds with puree (crush if large and not contraindicated) Supervision: Full supervision/cueing for compensatory strategies Compensations: Slow rate;Small sips/bites Postural Changes and/or Swallow Maneuvers: Seated upright 90 degrees;Upright 30-60 min after meal                Oral Care Recommendations: Oral care QID Follow Up Recommendations: Other (comment) SLP Visit Diagnosis: Dysphagia, unspecified (R13.10);Dysphagia, oral phase (R13.11) Plan: Continue with current plan of care         Kathleen Lime, MS [place] Office [phone] Pager [phone]   Macario Golds  09/27/2021, 9:00 AM

## 2021-09-27 NOTE — Progress Notes (Signed)
STROKE TEAM PROGRESS NOTE   INTERVAL HISTORY  Patient seems more sleepy and hard to arouse today..  Patient does not open eyes to voice and mutters a few words but will not follow any  commands for me. .  Vital signs stable.  Neurological exam unchanged.  Palliative care has been consulted.  TEE is not yet scheduled.  Labs are mostly unremarkable except for elevated creatinine.   Vitals:   09/27/21 0300 09/27/21 0400 09/27/21 0505 09/27/21 0730  BP: (!) 131/38 (!) 125/59  (!) 153/31  Pulse: 80 86  93  Resp: 15 18    Temp:  98.6 F (37 C)    TempSrc:  Oral    SpO2: 98% 100%  99%  Weight:   58.9 kg   Height:       CBC:  Recent Labs  Lab 09/26/21 0900 09/27/21 0129  WBC 12.7* 12.6*  NEUTROABS 10.0* 8.9*  HGB 8.0* 9.2*  HCT 27.7* 32.0*  MCV 95.5 96.4  PLT 329 301   Basic Metabolic Panel:  Recent Labs  Lab 09/23/21 0300 09/23/21 0734 09/24/21 0257 09/26/21 0728 09/26/21 0900 09/27/21 0129  NA 131*  --  133*  --  130* 134*  K 4.2   < > 3.3*  --  3.3* 3.8  CL 92*  --  93*  --  93* 96*  CO2 20*  --  24  --  21* 25  GLUCOSE 153*  --  244*  --  238* 132*  BUN 53*  --  36*  --  71* 39*  CREATININE 6.68*  --  4.76*  --  7.22* 5.35*  CALCIUM 7.5*  --  7.5*  --  8.2* 8.4*  MG 2.3  --  2.3  --   --   --   PHOS  --    < > 7.0*   < > 5.5* 4.6   < > = values in this interval not displayed.   Lipid Panel: No results for input(s): CHOL, TRIG, HDL, CHOLHDL, VLDL, LDLCALC in the last 168 hours. HgbA1c: No results for input(s): HGBA1C in the last 168 hours. Urine Drug Screen: No results for input(s): LABOPIA, COCAINSCRNUR, LABBENZ, AMPHETMU, THCU, LABBARB in the last 168 hours.  Alcohol Level No results for input(s): ETH in the last 168 hours.  IMAGING past 24 hours US Abdomen Limited RUQ (LIVER/GB)  Result Date: 09/26/2021 CLINICAL DATA:  Question choledocholithiasis, abnormal CT EXAM: ULTRASOUND ABDOMEN LIMITED RIGHT UPPER QUADRANT COMPARISON:  CT 09/26/2021 FINDINGS: Gallbladder:  Gallbladder slightly contracted. Sludge without shadowing stones. Fold at the fundus. Increased wall thickness up to 4.6 mm. Negative sonographic Murphy. Common bile duct: Diameter: Mildly dilated at 9.8 mm. Liver: No focal lesion identified. Within normal limits in parenchymal echogenicity. Portal vein is patent on color Doppler imaging with normal direction of blood flow towards the liver. Other: None. IMPRESSION: 1. Gallbladder sludge with increased wall thickness but negative sonographic Murphy. Correlation with nuclear medicine hepatobiliary imaging may be obtained if there is concern for acute gallbladder disease. 2. Dilated common bile duct up to 9.8 mm. Consider correlation with MRCP. Electronically Signed   By: Donavan Foil M.D.   On: 09/26/2021 20:22    PHYSICAL EXAM GENERAL: 1 African-American lady laying comfortably in hospital bed, in no acute distress, on dialysis LUNGS: Normal respiratory effort. Non-labored breathing on room air CV: Regular rate and rhythm on telemetry Musculoskeletal : Right below-knee amputation NEURO:  Mental Status: drowsy, does not open eyes to stimuli  throughout, does walk follow commands  She does speak a few words and occasional short sentences she does not attempt to name objects or follow commands. Does not answer orientation questions.  Cranial Nerves:  II: Right pupil is nonreactive to direct light, is 3 mm and irregular. The left pupil is 5 mm, irregular, and sluggishly reactive to light.  III, IV, VI: Does not open eyes, does not fixate or track examiner.  Eyes are dysconjugate with mild right eye exotropia and left eye hyportropia. V: Does not blink to threat throughout   VII: Face appears symmetric resting and with movement.  VIII: Unable to assess due to patient's condition IX, X: Does not cough spontaneously, does not allow for visualization of patient's soft palate XI: Head is grossly midline XII: Does not protrude tongue Motor:   Spontaneous movement in right upper extremity which is purposeful.  Left upper extremity is weak but withdraws to noxious stimuli.  Withdraws right lower extremity noxious stimuli but not left lower extremity.  She is able to maintain tone with left lower extremity when flexed at the hip and.  . Right BKA.  Tone and bulk are normal.  Sensation: Localizes to noxious stimuli in bilateral upper extremities.  Coordination: Does not perform   Gait: Deferred  ASSESSMENT/PLAN [name] is a 41 y.o. female with history of ESRD on HD T/Th/Sat, type 2 diabetes mellitus with neuropathy, hypertension, osteomyelitis with BKA, right eye blindness, and medical non-adherence presenting with  lethargy and AMS following HD on 2/3.  Initial work up in the ED revealed patient with leukocytosis concerning for infection and imaging with concern for osteomyelitis for which vascular surgery recommended admission for BKA on 2/5 and ongoing antibiotics. Of note, patient was discharged in January following urgent transmetatarsal amputation after being found with gangrenous toes s/p wound vac and IV Ancef after wound cultures grew MSSA. During hospitalization, patient's mental status initially improved and she was responding appropriately on evaluation on 2/4 with surgery for BKA on 2/5. On 2/6, she was noted to be somnolent and slow to respond but she was answering questions appropriately. On 2/7 patient had an episode of hypotension with HD but was noted to still be lethargic but followed commands and answered questions appropriately. Patient again had HD on 2/9 and became more lethargic, patient lethargic on exam during HD on 2/11.  Stroke:  scattered punctate acute infarcts throughout both cerebral  hemispheres likely  embolic -in setting of osteomyelitis consideration for bacterial endocarditis or paradoxical embolism CT head No acute abnormality. CTA head & neck - No large vessel occlusion. Intracranial atherosclerosis  including moderate right and mild left ICA stenoses. Widely patent cervical carotid arteries. Mild left V2 vertebral artery stenosis. Scattered small peribronchovascular pulmonary densities in both upper lobes, likely infectious or inflammatory MRI - Scattered punctate acute infarcts throughout both cerebral hemispheres which may be embolic. More confluent region of potentially subacute infarction in the left corona radiata. Chronic ischemia with multiple chronic infarcts as above. 2D Echo EF 70-75%, hyperdynamic LV, evidence of atrial level shunt detected by color-flow Doppler (the bubble study positive) EEG- Mild generalized slowing. This is a nonspecific finding but is consistent with a generalized disturbance of cerebral function and may be seen in toxic, metabolic, post anoxic, multifocal or diffuse structural abnormalities.   Focal slowing is seen intermittently in the right centrotemporal region. This is a finding that may be consistent with a focal disturbance of cerebral function such as her recent finding of b/l  ischemic events. No electrographic seizures were recorded.  LDL 97 HgbA1c 10.4 VTE prophylaxis - heparin subq No antithrombotic prior to admission, now on aspirin 300 mg suppository daily.  Therapy recommendations: Pending Disposition: Pending  PFO on echocardiogram Plan for TEE next week LE Venous duplex negative for DVT  Hypertension Home meds:  Losartan Stable BP goal (130-150) Long-term BP goal normotensive Midodrine added due to hypotension  Hyperlipidemia Home meds: None LDL 97, goal < 70 Start statin when medically appropriate  Diabetes type II Uncontrolled Home meds:  INsulin HgbA1c 10.4, goal < 7.0 CBGs SSI  Other Stroke Risk Factors Hx stroke/TIA Coronary artery disease  Other Active Problems Osteomyelitis BKA 2/5  End stage renal disease HD per nephrology  Cr 6.68 Leukocytosis - management by primary team WBC 15.7 Vancomycin,  merrem Malnutrition Dietary/nutrition consult    Hospital day # 12  Patient presented with altered mental status in the setting of osteomyelitis and brain MRI scan showed multiple bilateral cortical and subcortical infarcts with strong possibility of cardiogenic embolism.  Await TEE for endocarditis  .  Encourage oral intake and diet and speech therapy evaluation.    Continue ongoing medical management as per medical hospitalist team.  No family available at the bedside for discussion.  Stroke team will sign off."  Kindly call for questions.  Discussed with Dr. Wynelle Cleveland greater than 50% time during this 25-minute visit was spent in counseling and coordination of care and discussion with patient and care team and answering questions.     Antony Contras, MD Medical Director [place] Pager: [phone] 09/27/2021 1:12 PM  To contact Stroke Continuity provider, please refer to [email or web address] After hours, contact General Neurology

## 2021-09-27 NOTE — Progress Notes (Signed)
Nutrition Follow-up  DOCUMENTATION CODES:   Non-severe (moderate) malnutrition in context of acute illness/injury  INTERVENTION:   After Cortrak placed, transition to nocturnal tube feeds: - Nepro @ 55 ml/hr x 12 hours from 1800 to 0600 (total of 660 ml)  Nocturnal tube feeding regimen will provide 1188 kcal, 53 grams of protein, and 480 ml of H2O (meets 66% of kcal needs and 59% of protein needs).  - Offer Nepro Shake po BID between meals, each supplement provides 425 kcal and 19 grams protein  - Continue renavit daily  - Encourage PO intake and provide feeding assistance as needed  NUTRITION DIAGNOSIS:   Moderate Malnutrition related to acute illness (osteomyelitis of R foot) as evidenced by mild fat depletion, moderate muscle depletion.  New diagnosis after completion of NFPE  GOAL:   Patient will meet greater than or equal to 90% of their needs  Progressing, being addressed via TF and oral nutrition supplements  MONITOR:   PO intake, Supplement acceptance, Labs, Weight trends, TF tolerance, Skin, I & O's  REASON FOR ASSESSMENT:   Consult Assessment of nutrition requirement/status, Enteral/tube feeding initiation and management  ASSESSMENT:   41 y.o. 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 In ED, imaging suggested osteomyelitis of the right foot.  Pt admitted with acute metabolic encephalopathy due to acute embolic strokes, watershed infarct.  02/05 - s/p R BKA 02/11 - NG tube placed, tube feeds initiated 02/13 - CT abdomen/pelvis negative for rectovaginal fistula 02/15 - NG tube removed, diet advanced to dysphagia 2 with thin liquids  Last iHD was on 2/14 with 1125 ml net UF. Next iHD planned for 2/16.  Pt's diet was advanced this AM to dysphagia 2 with thin liquids. Discussed pt with RN. Pt refused lunch meal but had  just taken some applesauce with meds. Pt did not arouse to RD voice or touch. Reached out to MD regarding Cortrak placement for nocturnal tube feeds. MD in agreement. Will continue oral nutrition supplements.  Admit weight: 55 kg Current weight: 58.9 kg EDW: 59.5 kg  Medications reviewed and include: calcitriol, sensipar, aranesp weekly, colace, Nepro Shake BID, SSI q 4 hours, semglee 10 units BID, renavit, protonix, renvela TID, IV abx  Labs reviewed: sodium 134, corrected calcium 10.08, WBC 12.6, hemoglobin 9.2 CBG's: 103-241 x 24 hours  NUTRITION - FOCUSED PHYSICAL EXAM:  Flowsheet Row Most Recent Value  Orbital Region Mild depletion  Upper Arm Region No depletion  Thoracic and Lumbar Region Mild depletion  Buccal Region Mild depletion  Temple Region No depletion  Clavicle Bone Region Mild depletion  Clavicle and Acromion Bone Region Mild depletion  Scapular Bone Region Mild depletion  Dorsal Hand Unable to assess  Patellar Region Moderate depletion  Anterior Thigh Region Moderate depletion  Posterior Calf Region Severe depletion  Edema (RD Assessment) None  Hair Reviewed  Eyes Reviewed  Mouth Reviewed  Skin Reviewed  Nails Reviewed       Diet Order:   Diet Order             DIET DYS 2 Room service appropriate? Yes; Fluid consistency: Thin; Fluid restriction: 1200 mL Fluid  Diet effective now                   EDUCATION NEEDS:   Not appropriate for education at this time  Skin:  Skin Assessment: Skin  Integrity Issues: Stage II: sacrum Incisions: R BKA Other: MASD bilateral buttocks  Last BM:  09/27/21 medium type 6  Height:   Ht Readings from Last 1 Encounters:  09/17/21 5\' 1"  (1.549 m)    Weight:   Wt Readings from Last 1 Encounters:  09/27/21 58.9 kg    Ideal Body Weight:  44.9 kg (adjusted by 5.9% for BKA)  BMI:  Body mass index is 24.54 kg/m.  Estimated Nutritional Needs:   Kcal:  1800-2000 kcal/d  Protein:  90-100 g/d  Fluid:   1L+UOP    Gustavus Bryant, MS, RD, LDN Inpatient Clinical Dietitian Please see AMiON for contact information.

## 2021-09-27 NOTE — Procedures (Signed)
Cortrak  Person Inserting Tube:  Alroy Dust, Jammy Plotkin L, RD Tube Type:  Cortrak - 43 inches Tube Size:  10 Tube Location:  Left nare Initial Placement:  Stomach Secured by: Bridle Technique Used to Measure Tube Placement:  Marking at nare/corner of mouth Cortrak Secured At:  64 cm  Cortrak Tube Team Note:  Consult received to place a Cortrak feeding tube.   X-ray is required, abdominal x-ray has been ordered by the Cortrak team. Please confirm tube placement before using the Cortrak tube.   If the tube becomes dislodged please keep the tube and contact the Cortrak team at [email or web address] ([email or web address]) for replacement.  If after hours and replacement cannot be delayed, place a NG tube and confirm placement with an abdominal x-ray.    Roxana Hires, RD, LDN Clinical Dietitian See [facility] for contact information.

## 2021-09-27 NOTE — Plan of Care (Signed)
°  Problem: Clinical Measurements: Goal: Ability to maintain clinical measurements within normal limits will improve Outcome: Progressing Goal: Diagnostic test results will improve Outcome: Progressing   Problem: Pain Managment: Goal: General experience of comfort will improve Outcome: Progressing   Problem: Skin Integrity: Goal: Risk for impaired skin integrity will decrease Outcome: Not Progressing   Problem: Clinical Measurements: Goal: Ability to maintain clinical measurements within normal limits will improve Outcome: Progressing   Problem: Skin Integrity: Goal: Demonstration of wound healing without infection will improve Outcome: Progressing

## 2021-09-27 NOTE — Progress Notes (Signed)
Palliative:  HPI: 41 yo F PMH ESRD on TTS HD, DM2, Neuropathy, HTN, medical non-compliance presented, gangrene RLE to ED 09/15/21 from HD after pt became altered after HD session. Admitted to [facility] for further care. Imaging of R foot obtained which were concerning for osteomyelitis. VVS consulted and pt underwent R BKA 2/5. Palliative care has been asked to get involved in the setting of multiple acute on chronic disease processes to further discuss goals of care.   I met today at [facility]' bedside. No family present. She is very slow to respond but she does answer my questions appropriately. She asks me "can you help me pull up my blankets?" And when asked if she is cold she nods yes. I ask how she is doing and she tells me "I have a headache." I asked her is she has eaten anything and she nods head no and I ask if she is hungry and she nods head yes. I asked her frankly "if you are too sleepy who do you want to make decisions for you?" and she replies "my sister." I asked if she means [name] and she nods yes. I asked "what about [name]" and she nods her head no.   Upon discussion with RN and TOC there have been some concerning behaviors from husband who is reportedly staying the night but calls nursing to even help Tynesha with her sheets or adjusting her pillow when he is already at bedside. He has not been seen to be very supportive of Morghan per nursing reports. Staff note that sister, [name], is in process of pursuing legal guardianship and it is my understanding that she has been caring for Kyliyah full time for some time now even prior to this admission and illness. We discussed potential for completion of HCPOA if Nevah' mental state is improved enough to do so.   I attempted to call sister, [name], but no answer. No return call.   Exam: Lethargic. Slow to respond but appropriate responses. Does not open her eyes or follow all commands but makes attempts. No distress. Resting comfortably. HR RRR. Abd soft.    Plan: - Full code, full scope.  Maddalyn Lutze expresses verbally that she desires sister, [name], to be her surrogate Media planner. Plans for HCPOA documentation when awake/alert enough to complete.   Preston, NP Palliative Medicine Team Pager [phone] (Please see [email or web address] for schedule) Team Phone [phone]    Greater than 50%  of this time was spent counseling and coordinating care related to the above assessment and plan

## 2021-09-27 NOTE — Progress Notes (Signed)
[facility] Progress Note   Subjective:  no acute events, hypotensive yesterday towards the end of treatment, BP better now. No changes in mentation  Objective Vitals:   09/27/21 0300 09/27/21 0400 09/27/21 0505 09/27/21 0730  BP: (!) 131/38 (!) 125/59  (!) 153/31  Pulse: 80 86  93  Resp: 15 18    Temp:  98.6 F (37 C)    TempSrc:  Oral    SpO2: 98% 100%  99%  Weight:   58.9 kg   Height:       Physical Exam DVV:[id number] ill appearing Heart:RRR Lungs:CTAB anterolaterally, no incd WOB Abdomen:soft, NTND Extremities: no LLE edema, R BKA  Neuro: awakens/opens eyes to vocal and tactile stimuli, not following commands Dialysis Access: RU AVF+ bruit   Filed Weights   09/25/21 0423 09/26/21 0840 09/27/21 0505  Weight: 56.7 kg 61.3 kg 58.9 kg    Intake/Output Summary (Last 24 hours) at 09/27/2021 0751 Last data filed at 09/26/2021 1225 Gross per 24 hour  Intake --  Output 1125 ml  Net -1125 ml    Labs: Basic Metabolic Panel: Recent Labs  Lab 09/24/21 0257 09/26/21 0728 09/26/21 0900 09/27/21 0129  NA 133*  --  130* 134*  K 3.3*  --  3.3* 3.8  CL 93*  --  93* 96*  CO2 24  --  21* 25  GLUCOSE 244*  --  238* 132*  BUN 36*  --  71* 39*  CREATININE 4.76*  --  7.22* 5.35*  CALCIUM 7.5*  --  8.2* 8.4*  PHOS 7.0* 5.6* 5.5* 4.6     CBC: Recent Labs  Lab 09/22/21 1342 09/23/21 0300 09/24/21 0257 09/26/21 0900 09/27/21 0129  WBC 14.5* 15.7* 14.6* 12.7* 12.6*  NEUTROABS 11.6* 13.1*  --  10.0* 8.9*  HGB 8.3* 8.7* 7.8* 8.0* 9.2*  HCT 27.1* 29.0* 27.6* 27.7* 32.0*  MCV 90.9 92.9 94.5 95.5 96.4  PLT 252 331 315 329 281   Recent Labs  Lab 09/26/21 0745 09/26/21 1304 09/26/21 1510 09/26/21 2023 09/27/21 0419  GLUCAP 228* 141* 137* 103* 171*   Medications:  sodium chloride     sodium chloride     sodium chloride     sodium chloride     cefTRIAXone (ROCEPHIN)  IV 2 g (09/27/21 0431)   magnesium sulfate bolus IVPB      aspirin  325 mg  Per NG tube Daily   calcitRIOL  0.25 mcg Per Tube BID   Chlorhexidine Gluconate Cloth  6 each Topical Q0600   cinacalcet  90 mg Oral Q supper   darbepoetin (ARANESP) injection - DIALYSIS  200 mcg Intravenous Q Thu-HD   dextrose  25 mL Intravenous Once   docusate  100 mg Per Tube Daily   dorzolamide-timolol  1 drop Left Eye BID   feeding supplement (NEPRO CARB STEADY)  1,000 mL Per Tube Q24H   feeding supplement (NEPRO CARB STEADY)  237 mL Per Tube BID BM   feeding supplement (PROSource TF)  45 mL Per Tube BID   free water  30 mL Per Tube Q6H   Gerhardt's butt cream   Topical TID   heparin injection (subcutaneous)  5,000 Units Subcutaneous Q8H   insulin aspart  0-6 Units Subcutaneous Q4H   insulin glargine-yfgn  10 Units Subcutaneous BID   lidocaine-prilocaine  1 application Topical UD   mouth rinse  15 mL Mouth Rinse BID   midodrine  5 mg Per Tube Q8H   multivitamin  1  tablet Per Tube Daily   naproxen  250 mg Per Tube BID WC   pantoprazole sodium  40 mg Per Tube Daily   sevelamer carbonate  1.6 g Per Tube TID WC   traMADol  100 mg Per Tube Q12H      Dialysis Orders: [facility] 3h 60min   400/ 600   59.5kg   2/2 bath  Prof 2   RU AV fistula  Hep 4000  Mircera 200 mcg IV every 2 weeks - last 1/26 Ancef 2 g 3 times weekly HD   Assessment/Plan: Sepsis/ shock - BP's better, off of pressors. On midodrine. Abx per pmd. 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 TEE pending. IV abx as above. 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 Dispo on hold ESRD - on HD TTS.  Next HD tomorrow 2/16. Try to keep SBP > 120 d/t #2.  Anemia of CKD- Hgb 9.2 on 2/15.  Aranesp 200 with HD on 2/9.  Transfuse pRBC prn MBD ckd - Ca ok, phos up.  Home vdra, sensipar and renvela continued here. Phos wnl HTN/volume - midodrine with HD. UF as tolerated Severe protein calorie malnutrition - Alb 2s.  Cont protein supplements.  Renal diet w/fluid restrictions.  DMT2 -  per PMD  Dispo - dc to SNF, per primary, palliative care consulted  Gean Quint, MD [facility]

## 2021-09-28 ENCOUNTER — Encounter (HOSPITAL_COMMUNITY)
Admission: EM | Disposition: A | Discharge status: Other Acute Inpatient Hospital | Payer: Self-pay | Source: Home / Self Care | Attending: Internal Medicine

## 2021-09-28 DIAGNOSIS — I6340 Cerebral infarction due to embolism of unspecified cerebral artery: Secondary | ICD-10-CM | POA: Diagnosis not present

## 2021-09-28 DIAGNOSIS — Z515 Encounter for palliative care: Secondary | ICD-10-CM | POA: Diagnosis not present

## 2021-09-28 DIAGNOSIS — E440 Moderate protein-calorie malnutrition: Secondary | ICD-10-CM | POA: Insufficient documentation

## 2021-09-28 DIAGNOSIS — Z7189 Other specified counseling: Secondary | ICD-10-CM | POA: Diagnosis not present

## 2021-09-28 LAB — GLUCOSE, CAPILLARY
Glucose-Capillary: 252 mg/dL — ABNORMAL HIGH (ref 70–99)
Glucose-Capillary: 141 mg/dL — ABNORMAL HIGH (ref 70–99)
Glucose-Capillary: 98 mg/dL (ref 70–99)
Glucose-Capillary: 170 mg/dL — ABNORMAL HIGH (ref 70–99)

## 2021-09-28 SURGERY — ECHOCARDIOGRAM, TRANSESOPHAGEAL
Anesthesia: Monitor Anesthesia Care

## 2021-09-28 MED ORDER — INSULIN GLARGINE-YFGN 100 UNIT/ML ~~LOC~~ SOLN
16.0000 [IU] | Freq: Two times a day (BID) | SUBCUTANEOUS | Status: DC
  Administered 2021-09-28 – 2021-09-30 (×6): 16 [IU] via SUBCUTANEOUS
  Administered 2021-10-01: 8 [IU] via SUBCUTANEOUS
  Administered 2021-10-01: 16 [IU] via SUBCUTANEOUS
  Filled 2021-09-28 (×9): qty 0.16

## 2021-09-28 NOTE — Progress Notes (Addendum)
SLP Cancellation Note  Patient Details Name: Domonique Cothran MRN: [id number] DOB: Nov 12, 1980   Cancelled treatment:       Reason Eval/Treat Not Completed: Other (comment);Patient at procedure or test/unavailable (pt off the floor, not in the room at this time).  Note pt underwent small bore feeding tube placement.  SLP will continue efforts.    Kathleen Lime, MS [facility] SLP Acute Rehab Services Office [phone] Pager [phone]    Macario Golds 09/28/2021, 8:52 AM

## 2021-09-28 NOTE — Progress Notes (Signed)
Husband remains at bedside throughout night, calls for RN to clean and turn patient.  Husband continues to ask for food and drinks for himself.  Explained to husband that food is for patients.   Educated husband how to turn pt.

## 2021-09-28 NOTE — Progress Notes (Addendum)
[facility]  Subjective  - does not respond or open eyes.   Objective (!) 170/63 95 98.5 F (36.9 C) (!) 21 98%  Intake/Output Summary (Last 24 hours) at 09/28/2021 1222 Last data filed at 09/27/2021 1500 Gross per 24 hour  Intake 60 ml  Output --  Net 60 ml       Stump demarcating, warm skin to touch   Assessment/Planning: POD # 11 right BKA  History of non healing right GT wound followed by non healing TMA  No drainage, no edema or signs of active infection Will continue to observe she is at high risk of AKA   Roxy Horseman 09/28/2021 12:22 PM --  Laboratory Lab Results: Recent Labs    09/26/21 0900 09/27/21 0129  WBC 12.7* 12.6*  HGB 8.0* 9.2*  HCT 27.7* 32.0*  PLT 329 281   BMET Recent Labs    09/26/21 0900 09/27/21 0129  NA 130* 134*  K 3.3* 3.8  CL 93* 96*  CO2 21* 25  GLUCOSE 238* 132*  BUN 71* 39*  CREATININE 7.22* 5.35*  CALCIUM 8.2* 8.4*    COAG Lab Results  Component Value Date   INR 1.2 08/10/2021   INR 1.1 06/30/2020   No results found for: PTT

## 2021-09-28 NOTE — Plan of Care (Signed)
°  Problem: Clinical Measurements: Goal: Ability to maintain clinical measurements within normal limits will improve Outcome: Progressing Goal: Diagnostic test results will improve Outcome: Progressing   Problem: Safety: Goal: Ability to remain free from injury will improve Outcome: Progressing   Problem: Clinical Measurements: Goal: Ability to maintain clinical measurements within normal limits will improve Outcome: Progressing Goal: Postoperative complications will be avoided or minimized Outcome: Progressing
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
## 2021-09-28 NOTE — Progress Notes (Signed)
[facility] Progress Note   Subjective:  patient seen and examined on HD earlier. No issues with HD. Mentation unchanged.  Objective Vitals:   09/28/21 0830 09/28/21 0900 09/28/21 0930 09/28/21 1000  BP: (!) 185/39 (!) 199/63 (!) 158/57 (!) 170/63  Pulse: 96 97 85 95  Resp: (!) 21 20 (!) 21   Temp:      TempSrc:      SpO2:      Weight:      Height:       Physical Exam RWE:[id number] ill appearing Heart:RRR Lungs:CTAB anterolaterally, no incd WOB Abdomen:soft, NTND Extremities: no LLE edema, R BKA  Neuro: awakens/opens eyes to vocal and tactile stimuli, not following commands Dialysis Access: RU AVF+ bruit  (in use)  Filed Weights   09/25/21 0423 09/26/21 0840 09/27/21 0505  Weight: 56.7 kg 61.3 kg 58.9 kg    Intake/Output Summary (Last 24 hours) at 09/28/2021 1221 Last data filed at 09/27/2021 1500 Gross per 24 hour  Intake 60 ml  Output --  Net 60 ml    Labs: Basic Metabolic Panel: Recent Labs  Lab 09/24/21 0257 09/26/21 0728 09/26/21 0900 09/27/21 0129  NA 133*  --  130* 134*  K 3.3*  --  3.3* 3.8  CL 93*  --  93* 96*  CO2 24  --  21* 25  GLUCOSE 244*  --  238* 132*  BUN 36*  --  71* 39*  CREATININE 4.76*  --  7.22* 5.35*  CALCIUM 7.5*  --  8.2* 8.4*  PHOS 7.0* 5.6* 5.5* 4.6     CBC: Recent Labs  Lab 09/22/21 1342 09/23/21 0300 09/24/21 0257 09/26/21 0900 09/27/21 0129  WBC 14.5* 15.7* 14.6* 12.7* 12.6*  NEUTROABS 11.6* 13.1*  --  10.0* 8.9*  HGB 8.3* 8.7* 7.8* 8.0* 9.2*  HCT 27.1* 29.0* 27.6* 27.7* 32.0*  MCV 90.9 92.9 94.5 95.5 96.4  PLT 252 331 315 329 281   Recent Labs  Lab 09/27/21 1616 09/27/21 2030 09/27/21 2353 09/28/21 0447 09/28/21 1135  GLUCAP 142* 121* 145* 252* 141*   Medications:  sodium chloride      aspirin  325 mg Per NG tube Daily   calcitRIOL  0.25 mcg Per Tube BID   Chlorhexidine Gluconate Cloth  6 each Topical Q0600   cinacalcet  90 mg Oral Q supper   darbepoetin (ARANESP) injection -  DIALYSIS  200 mcg Intravenous Q Thu-HD   dextrose  25 mL Intravenous Once   dorzolamide-timolol  1 drop Left Eye BID   feeding supplement (NEPRO CARB STEADY)  1,000 mL Per Tube Q24H   free water  30 mL Per Tube Q6H   Gerhardt's butt cream   Topical TID   heparin injection (subcutaneous)  5,000 Units Subcutaneous Q8H   insulin aspart  0-6 Units Subcutaneous Q4H   insulin glargine-yfgn  16 Units Subcutaneous BID   lidocaine-prilocaine  1 application Topical UD   mouth rinse  15 mL Mouth Rinse BID   midodrine  5 mg Per Tube Q8H   multivitamin  1 tablet Per Tube Daily   pantoprazole sodium  40 mg Per Tube Daily   sevelamer carbonate  1.6 g Per Tube TID WC      Dialysis Orders: [facility] 3h 70min   400/ 600   59.5kg   2/2 bath  Prof 2   RU AV fistula  Hep 4000  Mircera 200 mcg IV every 2 weeks - last 1/26 Ancef 2 g 3 times  weekly HD   Assessment/Plan: Sepsis/ shock - BP's better, off of pressors. On midodrine. Abx per pmd. 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 TEE pending. IV abx as above. 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 Dispo on hold ESRD - on HD TTS.  Next HD 2/18. Try to keep SBP > 120 d/t #2.  Anemia of CKD- Hgb 9.2 on 2/15.  Aranesp 200 with HD on 2/9.  Transfuse pRBC prn MBD ckd - Ca ok, phos up.  Home vdra, sensipar and renvela continued here. Phos wnl HTN/volume - midodrine with HD. UF as tolerated Severe protein calorie malnutrition - Alb 2s.  Cont protein supplements.  Renal diet w/fluid restrictions.  DMT2 - per PMD  Dispo - dc to SNF, per primary, palliative care consulted  Gean Quint, MD [facility]

## 2021-09-28 NOTE — Progress Notes (Signed)
Progress Note   Patient: [name] ZOX:[id number] DOB: May 02, 1981 DOA: 09/15/2021     13 DOS: the patient was seen and examined on 09/28/2021   Brief hospital course: [name] is a 41 y.o. female with medical history significant for poorly controlled DMT2, ESRD with HD on Tu,Th, Sat, neuropathy, HTN with poor compliance with medical treatment and HD was sent after completing dialysis session today due to staff concern for altered mental status because she was not speaking to them and "lethargy" per Triage note. 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 Grew MSSA and was receiving Ancef with dialysis.  In the ED, the patient initially acted as if she was unable to hear the admitting physician but then began to speak with her. Per history, her mental status had improved and no confusion was noted.  Xray of the right stump was suggestive of osteomyelitis.   2/4 Vascular surgery recommended a BKA and patient agreed.  2/5 underwent right BKA 2/6 appeared sleepy (kept eyes closed) 2/8 somnolent again on exam 2/9 still somnolent- volume removal limited by hypotension- Midodrine given  2/10 rapid response called for lethargy, minimally responsive to sternal rub- PCCM consulted, Neuro consulted - MRI obtained> scattered punctate acute infarcts bilaterally and confluen region in the left corona radiata. - CTA of head and neck: No large vessel occlusion. -  EEG >diffuse encephalopathy in addition to focal disturbance of cortical function in the right centrotemporal region - started on empiric antibiotics for possible endocarditis, ASA 325 mg - given pressors overnight  - 2/11- ECHO with bubble study > Agitated saline contrast bubble study was positive with shunting observed  within 3-6 cardiac cycles suggestive of interatrial shunt. , possible bicuspid aortic valve - NG tube placed and feeds started  2/12- more awake, not following commands  2/14-  Concerns for rectal vaginal fistula- CT abd/pelvis with rectal contrast was negative- Intrahepatic and extrahepatic biliary ductal dilatation with suggestion of mild gallbladder wall thickening - [place] abd showed a CBD of 9.8 mm  2/15- NG removed for SLP eval- D 2 diet with thin liquids recommended but due to waxing and waning mental status, a cor trak was placed  Assessment and Plan: * Osteomyelitis ([place])- (present on admission) - The patient underwent Right BKA on 09/17/2021.   Acute metabolic encephalopathy  - multifocal CVAs though to be secondary to embolism vs endocarditis - TTE bubble study + - TEE pending  - on ASA 325 mg via tube - dc'd Tramadol 100 mg BID (started on 2/11) and follow mental status - blood cultures from 2/10 are negative- dc Ceftriaxone on 2/15 - 2/16- told dialysis RN that she wanted to be turned- asleep again on my exam   PFO (patent foramen ovale) The patient is found to have interatrial shunt on TTE -  TEE pending - I called cardiology for this today  ESRD on hemodialysis ([place]) - on HD TTS   Hypotension - On Midodrine TID  Hyperglycemia due to type 2 diabetes mellitus ([place])- (present on admission) Hemoglobin A1C    Component Value Date/Time   HGBA1C 10.4 (H) 08/11/2021 0415   The patient's glucoses have been controlled with Semglee 10 units subcutaneous bid and q 4 hours SSI.   Nutrition - nephro carb via cor track    Pressure injury of skin There is a stage 2 medial sacrum right and left.     Subjective: asleep, nonverbal  Physical Exam: Vitals:   09/28/21  0830 09/28/21 0900 09/28/21 0930 09/28/21 1000  BP: (!) 185/39 (!) 199/63 (!) 158/57 (!) 170/63  Pulse: 96 97 85 95  Resp: (!) 21 20 (!) 21   Temp:      TempSrc:      SpO2:      Weight:      Height:       General exam: Appears comfortable - lethargic with mitts on hands HEENT: PERRLA, oral mucosa moist, no sclera icterus or thrush Respiratory system: Clear to auscultation.  Respiratory effort normal. Cardiovascular system: S1 & S2 heard, regular rate and rhythm Gastrointestinal system: Abdomen soft, non-tender, nondistended. Normal bowel sounds   Central nervous system: cannot assess Extremities: No cyanosis, clubbing or edema Skin: No rashes or ulcers Psychiatry:  cannot assess  .    Data Reviewed:  reveiwed  Family Communication: none  Disposition: Status is: Inpatient  Remains inpatient appropriate because: acute metabolic encephalopathy    Planned Discharge Destination:  TBD  DVT prophylaxis: Heparin injection 5,000 units  Scd's    Author: Debbe Odea, MD 09/28/2021 12:05 PM  For on call review [email or web address].

## 2021-09-29 DIAGNOSIS — M869 Osteomyelitis, unspecified: Secondary | ICD-10-CM | POA: Diagnosis not present

## 2021-09-29 DIAGNOSIS — G9341 Metabolic encephalopathy: Secondary | ICD-10-CM | POA: Diagnosis not present

## 2021-09-29 LAB — GLUCOSE, CAPILLARY
Glucose-Capillary: 183 mg/dL — ABNORMAL HIGH (ref 70–99)
Glucose-Capillary: 191 mg/dL — ABNORMAL HIGH (ref 70–99)
Glucose-Capillary: 284 mg/dL — ABNORMAL HIGH (ref 70–99)
Glucose-Capillary: 62 mg/dL — ABNORMAL LOW (ref 70–99)
Glucose-Capillary: 88 mg/dL (ref 70–99)
Glucose-Capillary: 89 mg/dL (ref 70–99)

## 2021-09-29 MED ORDER — CALCITRIOL 1 MCG/ML PO SOLN
0.2500 ug | Freq: Two times a day (BID) | ORAL | Status: DC
  Administered 2021-09-29 – 2021-10-01 (×5): 0.25 ug
  Filled 2021-09-29 (×5): qty 0.25

## 2021-09-29 MED ORDER — ASPIRIN 325 MG PO TABS
325.0000 mg | ORAL_TABLET | Freq: Every day | ORAL | Status: DC
  Administered 2021-09-30 – 2021-10-01 (×2): 325 mg
  Filled 2021-09-29 (×2): qty 1

## 2021-09-29 NOTE — Progress Notes (Signed)
Sister, Elzora Cullins, called back [phone]. Verbal consent over the phone received for her sister to undergo Transesophageal Echocardiogram to look at her heart function. Second nurse verifier Murray Hodgkins RN.  Sandria Senter PA-C notified. Consent in chart.

## 2021-09-29 NOTE — Progress Notes (Signed)
Inpatient Rehab Admissions Coordinator:   Per therapy recommendations pt was screened for CIR by Shann Medal, PT, DPT.  At this time we are recommending a CIR consult. I will place an order per our protocol.   Shann Medal, PT, DPT Admissions Coordinator [phone] 09/29/21  12:19 PM

## 2021-09-29 NOTE — Progress Notes (Signed)
Progress Note   Patient: [name] KMM:[id number] DOB: 07-Nov-1980 DOA: 09/15/2021     14 DOS: the patient was seen and examined on 09/29/2021   Brief hospital course: Torina Ey is a 41 y.o. female with medical history significant for poorly controlled DMT2, ESRD with HD on Tu,Th, Sat, neuropathy, HTN with poor compliance with medical treatment and HD was sent after completing dialysis session today due to staff concern for altered mental status because she was not speaking to them and "lethargy" per Triage note. 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 Grew MSSA and was receiving Ancef with dialysis.  In the ED, the patient initially acted as if she was unable to hear the admitting physician but then began to speak with her. Per history, her mental status had improved and no confusion was noted.  Xray of the right stump was suggestive of osteomyelitis.   2/4 Vascular surgery recommended a BKA and patient agreed.  2/5 underwent right BKA 2/6 appeared sleepy (kept eyes closed) 2/8 somnolent again on exam 2/9 still somnolent- volume removal limited by hypotension- Midodrine given  2/10 rapid response called for lethargy, minimally responsive to sternal rub- PCCM consulted, Neuro consulted - MRI obtained> scattered punctate acute infarcts bilaterally and confluen region in the left corona radiata. - CTA of head and neck: No large vessel occlusion. -  EEG >diffuse encephalopathy in addition to focal disturbance of cortical function in the right centrotemporal region - started on empiric antibiotics for possible endocarditis, ASA 325 mg - given pressors overnight  - 2/11- ECHO with bubble study > Agitated saline contrast bubble study was positive with shunting observed  within 3-6 cardiac cycles suggestive of interatrial shunt. 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    Acute metabolic encephalopathy  - multifocal CVAs though to be secondary to embolism vs endocarditis - TTE bubble study + - TEE planned for Monday - on ASA 325 mg via tube - dc'd Tramadol 100 mg BID (started on 2/11) and follow mental status - blood cultures from 2/10 are negative- dc Ceftriaxone on 2/15 - she continue to be lethargic on my exams- I have spoken with the sister today and recommended that a PEG be pursued- she is in agreement with this-order placed     PFO (patent foramen ovale) The patient is found to have interatrial shunt on TTE     ESRD on hemodialysis ([place]) - on HD TTS    Hypotension - On Midodrine TID   Hyperglycemia due to type 2 diabetes mellitus ([place])- (present on admission) Hemoglobin A1C Labs (Brief)          Component Value Date/Time    HGBA1C 10.4 (H) 08/11/2021 0415      The patient's glucoses have been controlled with Semglee 10 units subcutaneous bid and q 4 hours SSI.    Nutrition - nephro carb via cor track  Subjective: does not respond appropriately  Physical Exam: Vitals:   09/28/21 2013 09/29/21 0004 09/29/21 0309 09/29/21 0700  BP: (!) 153/88 (!) 152/63 (!) 142/75 (!) 172/59  Pulse: 94 90 89 85  Resp: 19 16 16 16   Temp: 98.2 F (36.8 C) 97.6 F (36.4 C) 98.3 F (36.8 C) 98.3 F (36.8 C)  TempSrc: Axillary Axillary Oral Oral  SpO2: 98% 97% 98% 98%  Weight:   59.3 kg   Height:       General exam: Appears comfortable - lethargic HEENT:   no sclera icterus or thrush- cro trak presnt Respiratory system: Clear to auscultation. Respiratory effort normal. Cardiovascular system: S1 & S2 heard, regular rate and rhythm Gastrointestinal system: Abdomen soft, non-tender, nondistended. Normal bowel sounds   Extremities: No cyanosis, clubbing or edema Skin: No rashes or ulcers  e.    Data Reviewed:    Family Communication: Sister, [name]  Disposition: Status is: Inpatient  Remains inpatient appropriate because: Needs PEG and TEE and then SNF    Planned Discharge Destination: Skilled nursing facility     Time spent: 35 minutes  Author: Debbe Odea, MD 09/29/2021 11:10 AM  For on call review [email or web address].

## 2021-09-29 NOTE — TOC Progression Note (Signed)
Transition of Care [facility]) - Progression Note    Patient Details  Name: Latressa Harries MRN: [id number] Date of Birth: 1980-09-27  Transition of [facility]) CM/SW Contact  Reece Agar, [place] Phone Number: 09/29/2021, 1:30 PM  Clinical Narrative:    CSW attempted to reach out to Carollee Leitz at [employer] in [facility] for information on pt HD center and transportation. Maudie Mercury is out of work and requested contacting Janett Billow at [phone], Janett Billow wasn't available CSW left a VM.  PT has changed recommendation to CIR, CSW will continue to follow for DC planning needs.   Expected Discharge Plan: Skilled Nursing Facility Barriers to Discharge: Continued Medical Work up, SNF Pending bed offer  Expected Discharge Plan and Services Expected Discharge Plan: [place] In-house Referral: Clinical Social Work   Post Acute Care Choice: [place] Living arrangements for the past 2 months: Apartment                                       Social Determinants of Health (SDOH) Interventions    Readmission Risk Interventions Readmission Risk Prevention Plan 08/23/2021  Transportation Screening Complete  HRI or Home Care Consult Complete  Social Work Consult for Olivia Lopez de Gutierrez Planning/Counseling Complete  Palliative Care Screening Not Applicable  Medication Review Press photographer) Complete  Some recent data might be hidden

## 2021-09-29 NOTE — Progress Notes (Signed)
SLP Cancellation Note  Patient Details Name: [name] MRN: [id number] DOB: Aug 22, 1980   Cancelled treatment:  Pt declined any POs despite multiple efforts. Will f/u early next week to determine readiness for diet advancement.  Sharnika Binney L. Tivis Ringer, [place] CCC/SLP Acute Rehabilitation Services Office number [phone] Pager [phone]          Juan Quam Laurice 09/29/2021, 3:10 PM

## 2021-09-29 NOTE — Progress Notes (Signed)
Navigator has not been able to obtain approval for pt to receive HD at [facility] in [facility] as of yet. [place] is staying that [facility] will transport pt to [facility] while pt at snf. Contacted TOC staff to see if they can inquire of snf if that is accurate info. Will await response.   Melven Sartorius Renal Navigator [phone]

## 2021-09-29 NOTE — Progress Notes (Addendum)
Pt began yelling out. RN responded to room. Husband found leaning over patient messing with her face. Pt swatted at him with her hand in a mit. Pt asked where she was and how long she had been there.   RN attempted to call sister, [name], [phone]) again for consent for TEE. Message requesting callback left.

## 2021-09-29 NOTE — Plan of Care (Signed)
°  Problem: Education: Goal: Knowledge of General Education information will improve Description: Including pain rating scale, medication(s)/side effects and non-pharmacologic comfort measures Outcome: Progressing   Problem: Clinical Measurements: Goal: Postoperative complications will be avoided or minimized Outcome: Progressing

## 2021-09-29 NOTE — Plan of Care (Signed)
°  Problem: Health Behavior/Discharge Planning: Goal: Ability to manage health-related needs will improve Outcome: Not Progressing   Problem: Clinical Measurements: Goal: Ability to maintain clinical measurements within normal limits will improve Outcome: Not Progressing   Problem: Clinical Measurements: Goal: Diagnostic test results will improve Outcome: Not Progressing   Problem: Pain Managment: Goal: General experience of comfort will improve Outcome: Not Progressing   Problem: Clinical Measurements: Goal: Postoperative complications will be avoided or minimized Outcome: Not Progressing

## 2021-09-29 NOTE — Progress Notes (Signed)
Physical Therapy Treatment Patient Details Name: [name] MRN: [id number] DOB: May 02, 1981 Today's Date: 09/29/2021   History of Present Illness [name] is a 41 y.o. female who was sent after completing dialysis session 09/15/21 due to staff concern for altered mental status.  She was not speaking to them. 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    PT Comments    Pt is more verbal, expressing her desires now, intermittently becoming agitated but easily calmed. She is A&Ox3, aware she had a stroke but did not seem aware of her BKA. She is also participating more, following simple multi-modal cues more consistently. As a result, she is requiring less assistance of min-modA to roll and maxA to transition supine <> sit today. Pt also sitting EOB with modA majority of time as she would tend to lean to her L and demonstrate poor truncal control with delayed reactional strategies. Pt continues to keep her eyes closed with no active attempts to open eyelids. In addition, no muscle activation noted at bil quads, hamstrings, or L anterior tibialis or gastroc muscles when cued. Instead, she would move her hips. Pt also with R UE getting fatigued quicker than L. Will continue to follow acutely. Due to pt's improved cognition and participation, updated d/c recs to AIR as she would greatly benefit from intensive therapy secondary to her young age, significant functional decline, and complex medical situation.   Recommendations for follow up therapy are one component of a  multi-disciplinary discharge planning process, led by the attending physician.  Recommendations may be updated based on patient status, additional functional criteria and insurance authorization.  Follow Up Recommendations  Acute inpatient rehab (3hours/day)     Assistance Recommended at Discharge Frequent or constant Supervision/Assistance  Patient can return home with the following Two people to help with walking and/or transfers;Two people to help with bathing/dressing/bathroom;A lot of help with bathing/dressing/bathroom;A lot of help with walking and/or transfers;Assistance with cooking/housework;Assistance with feeding;Direct supervision/assist for medications management;Direct supervision/assist for financial management;Assist for transportation;Help with stairs or ramp for entrance   Equipment Recommendations  BSC/3in1;Wheelchair (measurements PT);Wheelchair cushion (measurements PT);Hospital bed;Other (comment) (hoyer lift)    Recommendations for Other Services Rehab consult     Precautions / Restrictions Precautions Precautions: Fall Precaution Comments: limb protector RLE; mittens bil Restrictions Weight Bearing Restrictions: Yes RLE Weight Bearing: Non weight bearing     Mobility  Bed Mobility Overal bed mobility: Needs Assistance Bed Mobility: Rolling, Sidelying to Sit, Sit to Sidelying Rolling: Mod assist, Min assist Sidelying to sit: Max assist, HOB elevated     Sit to sidelying: Max assist, HOB elevated General bed mobility comments: Hand-over-hand guidance to bed rails to pull on to roll, varying needing min-modA to roll to R several times. MaxA to initiate leg guidance towards EOB but then pt assisted, maxA to lift trunk to sit. MaxA to bring legs onto bed and control trunk descent, cuing pt to lean laterally onto elbow.    Transfers                   General transfer comment: Pt requesting to stand initially,  got ready with cues for pt to support self  on PT with anterior approach and L knee block, but then pt suddenly became agitated and declined.    Ambulation/Gait               General Gait Details: Unable   Stairs             Wheelchair Mobility    Modified Rankin (Stroke Patients Only) Modified Rankin (Stroke Patients Only) Pre-Morbid Rankin Score: No symptoms Modified Rankin: Severe disability     Balance Overall balance assessment: Needs assistance Sitting-balance support: Bilateral upper extremity supported, No upper extremity supported, Single extremity supported Sitting balance-Leahy Scale: Poor Sitting balance - Comments: Pt varying from UE support to no UE support on bed, better stability when she would keep hands on bed though. ModA majority of time for balance, providing verbal and tactile cues to find midline, anteriorly tilt pelvis, adduct scapulas, and lift head. several LOB bouts in all direction resulting in intermittent maxA to recover, delayed reactional strategies by pt.       Standing balance comment: unable                            Cognition Arousal/Alertness: Lethargic Behavior During Therapy: Flat affect, Agitated Overall Cognitive Status: Impaired/Different from baseline Area of Impairment: Orientation, Attention, Following commands, Safety/judgement, Awareness, Problem solving, Memory                 Orientation Level: Situation Current Attention Level: Focused Memory: Decreased recall of precautions, Decreased short-term memory Following Commands: Follows one step commands inconsistently, Follows one step commands with increased time Safety/Judgement: Decreased awareness of safety, Decreased awareness of deficits Awareness: Intellectual Problem Solving: Slow processing, Decreased initiation, Difficulty sequencing, Requires verbal cues, Requires tactile cues General Comments: Pt able to state "hospital" and "[facility]" for place. "February" "2023." for situation  she responded "I had a stroke." Pt unaware of R leg amputation though. Eyes closed throughout session but responding to PT regularly, unable to actively bring eyelids open even when wiped clean. Poor awareness into her deficits and safety, needing max multi-modal cues to find midline sitting. Slow processing of simple cues, but attempts to follow >80% of cues. However, unable to separate attempts to move knees from hip movement. Much more verbal today, expressing desires, and a bit agitated at times in regards to sitting, but easily calmed and then agreeable to activities again.        Exercises General Exercises - Upper Extremity Shoulder Flexion: AAROM, Strengthening, Both, 10 reps, Supine (more assistance at R UE) Elbow Flexion: AROM, AAROM, Strengthening, Both, 10 reps, Supine (AAROM by pt for R UE) Elbow Extension: AAROM, AROM, Strengthening, Both, 10 reps, Supine (AAROM by pt for R UE) General Exercises - Lower Extremity Ankle Circles/Pumps:  (unable to activate L lower leg muscles with cues) Quad Sets:  (attempted to cue pt to perform bil, but no muscle activation noted in quads, instead pt flexing or extending hip) Heel Slides:  (attempted to cue pt to perform bil, but no muscle activation noted in hamstrings, pt flexing hip instead) Hip ABduction/ADduction: AAROM, Strengthening, Both, 10 reps, Supine Straight Leg Raises: AAROM, Both, 10 reps, Supine Other Exercises Other Exercises: anterior pelvic tilt and scapular adduction sitting EOB    General Comments        Pertinent Vitals/Pain Pain Assessment Pain Assessment: Faces Faces Pain Scale: Hurts even more Pain Location: vaginal region and  buttocks Pain Descriptors / Indicators: Grimacing, Moaning Pain Intervention(s): Limited activity within patient's tolerance, Monitored during session, Repositioned, Other (comment) (notified RN)    Home Living                          Prior Function            PT Goals  (current goals can now be found in the care plan section) Acute Rehab PT Goals Patient Stated Goal: to turn PT Goal Formulation: With patient Time For Goal Achievement: 10/02/21 Potential to Achieve Goals: Fair Progress towards PT goals: Progressing toward goals    Frequency    Min 4X/week      PT Plan Discharge plan needs to be updated;Frequency needs to be updated    Co-evaluation              AM-PAC PT "6 Clicks" Mobility   Outcome Measure  Help needed turning from your back to your side while in a flat bed without using bedrails?: A Lot Help needed moving from lying on your back to sitting on the side of a flat bed without using bedrails?: A Lot Help needed moving to and from a bed to a chair (including a wheelchair)?: Total Help needed standing up from a chair using your arms (e.g., wheelchair or bedside chair)?: Total Help needed to walk in hospital room?: Total Help needed climbing 3-5 steps with a railing? : Total 6 Click Score: 8    End of Session   Activity Tolerance: Patient tolerated treatment well;Patient limited by fatigue Patient left: in bed;with call bell/phone within reach;with bed alarm set Nurse Communication: Mobility status PT Visit Diagnosis: Other abnormalities of gait and mobility (R26.89);Muscle weakness (generalized) (M62.81);Difficulty in walking, not elsewhere classified (R26.2);Other symptoms and signs involving the nervous system (R29.898) Pain - Right/Left: Right Pain - part of body: Leg     Time: 2993-7169 PT Time Calculation (min) (ACUTE ONLY): 25 min  Charges:  $Therapeutic Activity: 8-22 mins $Neuromuscular Re-education: 8-22 mins                     Moishe Spice, PT, DPT Acute Rehabilitation Services  Pager: [phone] Office: [place] 09/29/2021, 11:53 AM

## 2021-09-29 NOTE — TOC Progression Note (Signed)
Transition of Care [facility]) - Progression Note    Patient Details  Name: [name] MRN: [id number] Date of Birth: 1981-01-02  Transition of Care [facility]) CM/SW [place], RN Phone [phone]  09/29/2021, 3:52 PM  Clinical Narrative:    TOC acknowledges new consult for patient to receive PEG next week and then can go to SNF- sister [name] is the decision maker. Message sent to SW to make her aware.    Expected Discharge Plan: Skilled Nursing Facility Barriers to Discharge: Continued Medical Work up, SNF Pending bed offer  Expected Discharge Plan and Services Expected Discharge Plan: [place] In-house Referral: Clinical Social Work   Post Acute Care Choice: [place] Living arrangements for the past 2 months: Apartment                                       Social Determinants of Health (SDOH) Interventions    Readmission Risk Interventions Readmission Risk Prevention Plan 08/23/2021  Transportation Screening Complete  HRI or Home Care Consult Complete  Social Work Consult for [place] Planning/Counseling Complete  Palliative Care Screening Not Applicable  Medication Review [employer]) Complete  Some recent data might be hidden

## 2021-09-29 NOTE — Progress Notes (Signed)
[facility] Progress Note   Subjective:  patient seen and examined bedside. No acute events. Mental status is still waxing and waning  Objective Vitals:   09/28/21 2013 09/29/21 0004 09/29/21 0309 09/29/21 0700  BP: (!) 153/88 (!) 152/63 (!) 142/75 (!) 172/59  Pulse: 94 90 89 85  Resp: 19 16 16 16   Temp: 98.2 F (36.8 C) 97.6 F (36.4 C) 98.3 F (36.8 C) 98.3 F (36.8 C)  TempSrc: Axillary Axillary Oral Oral  SpO2: 98% 97% 98% 98%  Weight:   59.3 kg   Height:       Physical Exam HBZ:[id number] ill appearing Heart:RRR Lungs:CTAB anterolaterally, no incd WOB Abdomen:soft, NTND Extremities: no LLE edema, R BKA  Neuro: awakens/opens eyes to vocal and tactile stimuli, not following commands Dialysis Access: RU AVF+ bruit  Filed Weights   09/26/21 0840 09/27/21 0505 09/29/21 0309  Weight: 61.3 kg 58.9 kg 59.3 kg    Intake/Output Summary (Last 24 hours) at 09/29/2021 0744 Last data filed at 09/28/2021 1030 Gross per 24 hour  Intake --  Output 2000 ml  Net -2000 ml    Labs: Basic Metabolic Panel: Recent Labs  Lab 09/24/21 0257 09/26/21 0728 09/26/21 0900 09/27/21 0129  NA 133*  --  130* 134*  K 3.3*  --  3.3* 3.8  CL 93*  --  93* 96*  CO2 24  --  21* 25  GLUCOSE 244*  --  238* 132*  BUN 36*  --  71* 39*  CREATININE 4.76*  --  7.22* 5.35*  CALCIUM 7.5*  --  8.2* 8.4*  PHOS 7.0* 5.6* 5.5* 4.6     CBC: Recent Labs  Lab 09/22/21 1342 09/23/21 0300 09/24/21 0257 09/26/21 0900 09/27/21 0129  WBC 14.5* 15.7* 14.6* 12.7* 12.6*  NEUTROABS 11.6* 13.1*  --  10.0* 8.9*  HGB 8.3* 8.7* 7.8* 8.0* 9.2*  HCT 27.1* 29.0* 27.6* 27.7* 32.0*  MCV 90.9 92.9 94.5 95.5 96.4  PLT 252 331 315 329 281   Recent Labs  Lab 09/28/21 1135 09/28/21 1633 09/28/21 2010 09/28/21 2358 09/29/21 0300  GLUCAP 141* 98 170* 191* 284*   Medications:  sodium chloride      aspirin  325 mg Per NG tube Daily   calcitRIOL  0.25 mcg Per Tube BID   Chlorhexidine  Gluconate Cloth  6 each Topical Q0600   cinacalcet  90 mg Oral Q supper   darbepoetin (ARANESP) injection - DIALYSIS  200 mcg Intravenous Q Thu-HD   dextrose  25 mL Intravenous Once   dorzolamide-timolol  1 drop Left Eye BID   feeding supplement (NEPRO CARB STEADY)  1,000 mL Per Tube Q24H   free water  30 mL Per Tube Q6H   Gerhardt's butt cream   Topical TID   heparin injection (subcutaneous)  5,000 Units Subcutaneous Q8H   insulin aspart  0-6 Units Subcutaneous Q4H   insulin glargine-yfgn  16 Units Subcutaneous BID   lidocaine-prilocaine  1 application Topical UD   mouth rinse  15 mL Mouth Rinse BID   midodrine  5 mg Per Tube Q8H   multivitamin  1 tablet Per Tube Daily   pantoprazole sodium  40 mg Per Tube Daily   sevelamer carbonate  1.6 g Per Tube TID WC      Dialysis Orders: [place] [facility] 3h 79min   400/ 600   59.5kg   2/2 bath  Prof 2   RU AV fistula  Hep 4000  Mircera 200 mcg IV every  2 weeks - last 1/26 Ancef 2 g 3 times weekly HD   Assessment/Plan: Sepsis/ shock - BP's better, off of pressors. On midodrine. Abx per pmd. 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 TEE pending. IV abx as above. 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 Dispo on hold ESRD - on HD TTS.  Next HD 2/18. Try to keep SBP > 120 d/t #2.  Anemia of CKD- Hgb 9.2 on 2/15.  Aranesp 200 with HD on 2/9.  Transfuse pRBC prn MBD ckd - Ca ok, phos up.  Home vdra, sensipar and renvela continued here. Phos wnl HTN/volume - midodrine with HD. UF as tolerated Severe protein calorie malnutrition -Cont protein supplements.  ngt in place DMT2 - per PMD  Dispo - dc to SNF, per primary, palliative care consulted  Gean Quint, MD [place]

## 2021-09-30 DIAGNOSIS — I6340 Cerebral infarction due to embolism of unspecified cerebral artery: Secondary | ICD-10-CM | POA: Diagnosis not present

## 2021-09-30 DIAGNOSIS — Z7189 Other specified counseling: Secondary | ICD-10-CM | POA: Diagnosis not present

## 2021-09-30 DIAGNOSIS — Z515 Encounter for palliative care: Secondary | ICD-10-CM | POA: Diagnosis not present

## 2021-09-30 LAB — RENAL FUNCTION PANEL
Albumin: 1.8 g/dL — ABNORMAL LOW (ref 3.5–5.0)
Anion gap: 15 (ref 5–15)
BUN: 46 mg/dL — ABNORMAL HIGH (ref 6–20)
CO2: 25 mmol/L (ref 22–32)
Calcium: 8.3 mg/dL — ABNORMAL LOW (ref 8.9–10.3)
Chloride: 93 mmol/L — ABNORMAL LOW (ref 98–111)
Creatinine, Ser: 6.88 mg/dL — ABNORMAL HIGH (ref 0.44–1.00)
GFR, Estimated: 7 mL/min — ABNORMAL LOW (ref >60)
Glucose, Bld: 235 mg/dL — ABNORMAL HIGH (ref 70–99)
Phosphorus: 4.8 mg/dL — ABNORMAL HIGH (ref 2.5–4.6)
Potassium: 3.7 mmol/L (ref 3.5–5.1)
Sodium: 133 mmol/L — ABNORMAL LOW (ref 135–145)

## 2021-09-30 LAB — GLUCOSE, CAPILLARY
Glucose-Capillary: 110 mg/dL — ABNORMAL HIGH (ref 70–99)
Glucose-Capillary: 123 mg/dL — ABNORMAL HIGH (ref 70–99)
Glucose-Capillary: 124 mg/dL — ABNORMAL HIGH (ref 70–99)
Glucose-Capillary: 172 mg/dL — ABNORMAL HIGH (ref 70–99)
Glucose-Capillary: 198 mg/dL — ABNORMAL HIGH (ref 70–99)
Glucose-Capillary: 258 mg/dL — ABNORMAL HIGH (ref 70–99)
Glucose-Capillary: 88 mg/dL (ref 70–99)

## 2021-09-30 LAB — CBC
HCT: 28.4 % — ABNORMAL LOW (ref 36.0–46.0)
Hemoglobin: 8.2 g/dL — ABNORMAL LOW (ref 12.0–15.0)
MCH: 28.1 pg (ref 26.0–34.0)
MCHC: 28.9 g/dL — ABNORMAL LOW (ref 30.0–36.0)
MCV: 97.3 fL (ref 80.0–100.0)
Platelets: 243 10*3/uL (ref 150–400)
RBC: 2.92 MIL/uL — ABNORMAL LOW (ref 3.87–5.11)
RDW: 26.5 % — ABNORMAL HIGH (ref 11.5–15.5)
WBC: 8.7 10*3/uL (ref 4.0–10.5)
nRBC: 1.3 % — ABNORMAL HIGH (ref 0.0–0.2)

## 2021-09-30 MED ORDER — ALBUMIN HUMAN 25 % IV SOLN
INTRAVENOUS | Status: AC
  Administered 2021-09-30: 12.5 g
  Filled 2021-09-30: qty 100

## 2021-09-30 MED ORDER — ACETAMINOPHEN 325 MG PO TABS
650.0000 mg | ORAL_TABLET | ORAL | Status: DC | PRN
  Administered 2021-09-30 – 2021-10-19 (×18): 650 mg via ORAL
  Filled 2021-09-30 (×19): qty 2

## 2021-09-30 MED ORDER — ALBUMIN HUMAN 25 % IV SOLN
12.5000 g | Freq: Once | INTRAVENOUS | Status: AC
  Administered 2021-09-30: 12.5 g via INTRAVENOUS

## 2021-09-30 MED ORDER — OXYCODONE-ACETAMINOPHEN 5-325 MG PO TABS
1.0000 | ORAL_TABLET | Freq: Once | ORAL | Status: AC
  Administered 2021-09-30: 1 via ORAL
  Filled 2021-09-30: qty 1

## 2021-09-30 NOTE — Progress Notes (Signed)
PT Cancellation Note  Patient Details Name: [name] MRN: [id number] DOB: 25-Oct-1980   Cancelled Treatment:    Reason Eval/Treat Not Completed: Patient at procedure or test/unavailable (Pt in HD. Will return as able.)   Alvira Philips 09/30/2021, 8:57 AM Ananias Kolander M,PT Acute Rehab Services [phone] (pager)

## 2021-09-30 NOTE — Plan of Care (Signed)
°  Problem: Skin Integrity: Goal: Demonstration of wound healing without infection will improve Outcome: Progressing   Problem: Education: Goal: Knowledge of General Education information will improve Description: Including pain rating scale, medication(s)/side effects and non-pharmacologic comfort measures Outcome: Not Progressing   Problem: Health Behavior/Discharge Planning: Goal: Ability to manage health-related needs will improve Outcome: Not Progressing   Problem: Clinical Measurements: Goal: Ability to maintain clinical measurements within normal limits will improve Outcome: Not Progressing Goal: Diagnostic test results will improve Outcome: Not Progressing   Problem: Pain Managment: Goal: General experience of comfort will improve Outcome: Not Progressing   Problem: Skin Integrity: Goal: Risk for impaired skin integrity will decrease Outcome: Not Progressing   Problem: Clinical Measurements: Goal: Postoperative complications will be avoided or minimized Outcome: Not Progressing

## 2021-09-30 NOTE — Progress Notes (Signed)
[place] [facility] Progress Note   Subjective:  patient seen and examined in HD, tolerating treatment, needed albumin. No acute events. Remains minimally interactive  Objective Vitals:   09/30/21 0800 09/30/21 0830 09/30/21 0900 09/30/21 0930  BP: (!) 85/35 (!) 131/59 (!) 117/56 122/68  Pulse: 77 81 78 85  Resp: 12 12    Temp:      TempSrc:      SpO2:      Weight:      Height:       Physical Exam BMW:[id number] ill appearing Heart:RRR Lungs:CTAB anterolaterally, no incd WOB Abdomen:soft, NTND Extremities: no LLE edema, R BKA  Neuro: awakens/opens eyes to vocal and tactile stimuli, not following commands Dialysis Access: RU AVF+ bruit (in use)  Filed Weights   09/29/21 0309 09/30/21 0544 09/30/21 0745  Weight: 59.3 kg 58.7 kg 61 kg    Intake/Output Summary (Last 24 hours) at 09/30/2021 1006 Last data filed at 09/30/2021 0300 Gross per 24 hour  Intake 632.25 ml  Output --  Net 632.25 ml    Labs: Basic Metabolic Panel: Recent Labs  Lab 09/26/21 0900 09/27/21 0129 09/30/21 0838  NA 130* 134* 133*  K 3.3* 3.8 3.7  CL 93* 96* 93*  CO2 21* 25 25  GLUCOSE 238* 132* 235*  BUN 71* 39* 46*  CREATININE 7.22* 5.35* 6.88*  CALCIUM 8.2* 8.4* 8.3*  PHOS 5.5* 4.6 4.8*     CBC: Recent Labs  Lab 09/24/21 0257 09/26/21 0900 09/27/21 0129 09/30/21 0837  WBC 14.6* 12.7* 12.6* 8.7  NEUTROABS  --  10.0* 8.9*  --   HGB 7.8* 8.0* 9.2* 8.2*  HCT 27.6* 27.7* 32.0* 28.4*  MCV 94.5 95.5 96.4 97.3  PLT 315 329 281 243   Recent Labs  Lab 09/29/21 1640 09/29/21 1936 09/30/21 0003 09/30/21 0511 09/30/21 0828  GLUCAP 62* 88 123* 258* 198*   Medications:  sodium chloride     albumin human      aspirin  325 mg Per Tube Daily   calcitRIOL  0.25 mcg Per Tube BID   Chlorhexidine Gluconate Cloth  6 each Topical Q0600   cinacalcet  90 mg Oral Q supper   darbepoetin (ARANESP) injection - DIALYSIS  200 mcg Intravenous Q Thu-HD   dextrose  25 mL Intravenous Once    dorzolamide-timolol  1 drop Left Eye BID   feeding supplement (NEPRO CARB STEADY)  1,000 mL Per Tube Q24H   free water  30 mL Per Tube Q6H   Gerhardt's butt cream   Topical TID   heparin injection (subcutaneous)  5,000 Units Subcutaneous Q8H   insulin aspart  0-6 Units Subcutaneous Q4H   insulin glargine-yfgn  16 Units Subcutaneous BID   lidocaine-prilocaine  1 application Topical UD   mouth rinse  15 mL Mouth Rinse BID   multivitamin  1 tablet Per Tube Daily   pantoprazole sodium  40 mg Per Tube Daily   sevelamer carbonate  1.6 g Per Tube TID WC      Dialysis Orders: [place] [facility] 3h 71min   400/ 600   59.5kg   2/2 bath  Prof 2   RU AV fistula  Hep 4000  Mircera 200 mcg IV every 2 weeks - last 1/26 Ancef 2 g 3 times weekly HD   Assessment/Plan: Sepsis/ shock - BP's better, off of pressors. On midodrine. Off abx AMS - acute embolic CVA and/or watershed infarcts. With PFO on TTE. TEE planned for Monday. S/p IV abx. 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 VVS following-she is high risk for AKA ESRD - on HD TTS.  Next HD 2/21. Try to keep SBP > 120 d/t #2.  Anemia of CKD- Hgb 9.2 on 2/15.  Aranesp 200 with HD on 2/9.  Transfuse pRBC prn MBD ckd - Home vdra, sensipar and renvela continued here. Phos acceptable HTN/volume - midodrine with HD. UF as tolerated w/ albumin support prn Severe protein calorie malnutrition -Cont protein supplements.  ngt in place. PEG to be pursued DMT2 - per PMD  Dispo - per primary, palliative care consulted  Gean Quint, MD [facility]

## 2021-09-30 NOTE — Progress Notes (Signed)
Sister called for update on patient. Sister en route for visit later today.

## 2021-09-30 NOTE — Progress Notes (Signed)
Progress Note   Patient: [name] KZL:[id number] DOB: Feb 11, 1981 DOA: 09/15/2021     15 DOS: the patient was seen and examined on 09/30/2021   Brief hospital course: [name] is a 41 y.o. female with medical history significant for poorly controlled DMT2, ESRD with HD on Tu,Th, Sat, neuropathy, HTN with poor compliance with medical treatment and HD was sent after completing dialysis session today due to staff concern for altered mental status because she was not speaking to them and "lethargy" per Triage note. 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 Grew MSSA and was receiving Ancef with dialysis.  In the ED, the patient initially acted as if she was unable to hear the admitting physician but then began to speak with her. Per history, her mental status had improved and no confusion was noted.  Xray of the right stump was suggestive of osteomyelitis.   2/4 Vascular surgery recommended a BKA and patient agreed.  2/5 underwent right BKA 2/6 appeared sleepy (kept eyes closed) 2/8 somnolent again on exam 2/9 still somnolent- volume removal limited by hypotension- Midodrine given  2/10 rapid response called for lethargy, minimally responsive to sternal rub- PCCM consulted, Neuro consulted - MRI obtained> scattered punctate acute infarcts bilaterally and confluen region in the left corona radiata. - CTA of head and neck: No large vessel occlusion. -  EEG >diffuse encephalopathy in addition to focal disturbance of cortical function in the right centrotemporal region - started on empiric antibiotics for possible endocarditis, ASA 325 mg - given pressors overnight  - 2/11- ECHO with bubble study > Agitated saline contrast bubble study was positive with shunting observed  within 3-6 cardiac cycles suggestive of interatrial shunt. , possible bicuspid aortic valve - NG tube placed and feeds started  2/12- more awake, not following commands  2/14-  Concerns for rectal vaginal fistula- CT abd/pelvis with rectal contrast was negative- Intrahepatic and extrahepatic biliary ductal dilatation with suggestion of mild gallbladder wall thickening - [place] abd showed a CBD of 9.8 mm  2/15- NG removed for SLP eval- D 2 diet with thin liquids recommended but due to waxing and waning mental status, a cor trak was placed  Assessment and Plan: * Osteomyelitis ([place])- (present on admission) - The patient underwent Right BKA on 09/17/2021.    Acute metabolic encephalopathy  - multifocal CVAs though to be secondary to embolism vs endocarditis - TTE bubble study + - TEE planned for Monday - on ASA 325 mg via tube - dc'd Tramadol 100 mg BID (started on 2/11) and follow mental status - blood cultures from 2/10 are negative- dc Ceftriaxone on 2/15 - she continue to be lethargic on my exams- I have spoken with the sister and recommended that a PEG be pursued- she is in agreement with this- IR consult placed on 2/17     PFO (patent foramen ovale) The patient is found to have interatrial shunt on TTE     ESRD on hemodialysis ([place]) - on HD TTS    Hypotension - On Midodrine TID   Hyperglycemia due to type 2 diabetes mellitus ([place])- (present on admission) Hemoglobin A1C Labs (Brief)              Component Value Date/Time    HGBA1C 10.4 (H) 08/11/2021 0415      The patient's glucoses have been controlled with Semglee 10 units subcutaneous bid and q 4 hours SSI.    Nutrition - nephro carb via cor  track              Subjective: somnolent and slow to respond  Physical Exam: Vitals:   09/30/21 0930 09/30/21 1000 09/30/21 1030 09/30/21 1105  BP: 122/68 105/60 137/70 (!) 149/82  Pulse: 85 83  85  Resp:   14 14  Temp:   97.7 F (36.5 C) 98.2 F (36.8 C)  TempSrc:   Temporal Oral  SpO2:   98% 95%  Weight:      Height:       General exam: Appears comfortable - asleep while on dialysis HEENT: no sclera icterus or thrush-core track  present Respiratory system: Clear to auscultation. Respiratory effort normal. Cardiovascular system: S1 & S2 heard, regular rate and rhythm Gastrointestinal system: Abdomen soft, non-tender, nondistended. Normal bowel sounds   Central nervous system: Lethargic-does not follow commands Extremities: No cyanosis, clubbing or edema Skin: No rashes or ulcers  imaging  Data Reviewed     Family Communication: With sister  Disposition: Status is: Inpatient  Remains inpatient appropriate because: Awaiting PEG and then skilled nursing facility    Planned Discharge Destination: Skilled nursing facility     Time spent: 35 minutes  Author: Debbe Odea, MD 09/30/2021 11:28 AM  For on call review [email or web address].

## 2021-09-30 NOTE — Progress Notes (Signed)
Several vapes are seen laying on the counter in the patient's room, near the sink.

## 2021-09-30 NOTE — Progress Notes (Signed)
Physical Therapy Treatment Patient Details Name: [name] MRN: [id number] DOB: 1981/03/18 Today's Date: 09/30/2021   History of Present Illness Jesusita Jocelyn is a 41 y.o. female who was sent after completing dialysis session 09/15/21 due to staff concern for altered mental status.  She was not speaking to them. 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    PT Comments    Pt admitted with above diagnosis. Pt was able to participate in sitting at EOB for up to 12 min with periods of min guard assist. Pt conversant and able to communicate her needs well today with  slurred speech at times.  Pt continues to make progress and discussed pt with admissions coordinator on Rehab as this PT feels this pt is a good candidate.  Pt goals due and while pt met 0/4 goals originally set, pt has begun to make progress over last few sessions.  Revised goals today. Pt currently with functional limitations due to balance and endurance deficits. Pt will benefit from skilled PT to increase their independence and safety with mobility to allow discharge to the venue listed below.      Recommendations for follow up therapy are one component of a multi-disciplinary discharge planning process, led by the attending physician.  Recommendations may be updated based on patient status, additional functional criteria and insurance authorization.  Follow Up Recommendations  Acute inpatient rehab (3hours/day)     Assistance Recommended at Discharge Frequent or constant Supervision/Assistance  Patient can return  home with the following Two people to help with walking and/or transfers;Two people to help with bathing/dressing/bathroom;A lot of help with bathing/dressing/bathroom;A lot of help with walking and/or transfers;Assistance with cooking/housework;Assistance with feeding;Direct supervision/assist for medications management;Direct supervision/assist for financial management;Assist for transportation;Help with stairs or ramp for entrance   Equipment Recommendations  BSC/3in1;Wheelchair (measurements PT);Wheelchair cushion (measurements PT);Hospital bed;Other (comment) (hoyer lift)    Recommendations for Other Services Rehab consult     Precautions / Restrictions Precautions Precautions: Fall Precaution Comments: limb protector RLE; mittens bil Restrictions Weight Bearing Restrictions: Yes RUE Weight Bearing: Weight bearing as tolerated RLE Weight Bearing: Non weight bearing     Mobility  Bed Mobility Overal bed mobility: Needs Assistance Bed Mobility: Rolling, Sidelying to Sit, Sit to Sidelying Rolling: Mod assist, Min assist Sidelying to sit: HOB elevated, Mod assist Supine to sit: HOB elevated, Mod assist Sit to supine: Max assist, +2 for physical assistance   General bed mobility comments: Hand-over-hand guidance to bed rails to pull on to roll, varying needing min-modA to roll to R several times. Cleaned pt of BM prior to sitting at eOB. MaxA to initiate leg guidance towards EOB but then pt assisted, modA to lift trunk to sit. MaxA to bring legs onto bed and control trunk descent, cuing pt to lean laterally onto elbow.  Pt also did scoot to right x 1 prior to lying down with mod assist with pt struggling to follow commands to lean forward to unwiehgt hips and not using UEs as she should.    Transfers  Ambulation/Gait                   Stairs             Wheelchair Mobility    Modified Rankin (Stroke Patients Only) Modified Rankin  (Stroke Patients Only) Pre-Morbid Rankin Score: No symptoms Modified Rankin: Severe disability     Balance Overall balance assessment: Needs assistance Sitting-balance support: Bilateral upper extremity supported, No upper extremity supported, Single extremity supported Sitting balance-Leahy Scale: Poor Sitting balance - Comments: Pt varying from UE support to no UE support on bed, better stability when she would keep hands on bed though. Min to min guard assist majority of time for balance, providing verbal and tactile cues to find midline, anteriorly tilt pelvis, adduct scapulas, and lift head. Pt sat a total of 12 minutes with much better balance today than in previous session. She also kicked her left LE to command with delay and was moving her right LE as well.                                    Cognition Arousal/Alertness: Awake/alert Behavior During Therapy: Flat affect, Impulsive Overall Cognitive Status: Impaired/Different from baseline Area of Impairment: Orientation, Attention, Following commands, Safety/judgement, Awareness, Problem solving, Memory                   Current Attention Level: Focused Memory: Decreased recall of precautions, Decreased short-term memory Following Commands: Follows one step commands consistently, Follows multi-step commands with increased time Safety/Judgement: Decreased awareness of safety, Decreased awareness of deficits Awareness: Intellectual Problem Solving: Slow processing, Decreased initiation, Difficulty sequencing, Requires verbal cues, Requires tactile cues General Comments: Pt able to state "hospital" and "[facility]" for place. "February" "2023." for situation she responded "I had a stroke." Pt also aware of R leg amputation. Eyes closed throughout session but responding to PT regularly, unable to actively bring eyelids open even when wiped clean. Blind right eye PTA per chart.  Poor awareness into her deficits and  safety.  Slow processing of simple cues, but attempts to follow >80% of cues. Much more verbal each day, expressing desires.        Exercises General Exercises - Lower Extremity Long Arc Quad: Left, 5 reps, Seated, AAROM Heel Slides: AAROM, Both, 10 reps, Supine Hip ABduction/ADduction: AAROM, Strengthening, Both, 10 reps, Supine Other Exercises Other Exercises: anterior pelvic tilt and scapular adduction sitting EOB    General Comments General comments (skin integrity, edema, etc.): VSS      Pertinent Vitals/Pain Pain Assessment Pain Assessment: Faces Faces Pain Scale: Hurts even more Pain Location: vaginal region and buttocks Pain Descriptors / Indicators: Grimacing, Moaning Pain Intervention(s): Limited activity within patient's tolerance, Monitored during session, Repositioned    Home Living                          Prior Function            PT Goals (current goals can now be found in the care plan section) Acute Rehab PT Goals PT Goal Formulation: With patient Time For Goal Achievement: 10/14/21 Potential to Achieve Goals: Fair Progress towards PT goals: Progressing toward goals    Frequency    Min 4X/week      PT Plan Current plan remains appropriate    Co-evaluation  AM-PAC PT "6 Clicks" Mobility   Outcome Measure  Help needed turning from your back to your side while in a flat bed without using bedrails?: A Lot Help needed moving from lying on your back to sitting on the side of a flat bed without using bedrails?: A Lot Help needed moving to and from a bed to a chair (including a wheelchair)?: Total Help needed standing up from a chair using your arms (e.g., wheelchair or bedside chair)?: Total Help needed to walk in hospital room?: Total Help needed climbing 3-5 steps with a railing? : Total 6 Click Score: 8    End of Session Equipment Utilized During Treatment: Gait belt Activity Tolerance: Patient tolerated  treatment well;Patient limited by fatigue Patient left: in bed;with call bell/phone within reach;with bed alarm set;with restraints reapplied Nurse Communication: Mobility status PT Visit Diagnosis: Other abnormalities of gait and mobility (R26.89);Muscle weakness (generalized) (M62.81);Difficulty in walking, not elsewhere classified (R26.2);Other symptoms and signs involving the nervous system (R29.898) Pain - Right/Left: Right Pain - part of body: Leg     Time: 1335-1410 PT Time Calculation (min) (ACUTE ONLY): 35 min  Charges:  $Therapeutic Exercise: 8-22 mins $Therapeutic Activity: 8-22 mins                     Annisten Manchester M,PT Acute Rehab Services [phone] (pager)    Alvira Philips 09/30/2021, 3:53 PM

## 2021-09-30 NOTE — Progress Notes (Signed)
[facility]  Subjective  -minimal interaction.  Able to voice she does have some stump related pain, but also noted pain everywhere   Objective (!) 131/59 81 97.6 F (36.4 C) (Temporal) 12 98%  Intake/Output Summary (Last 24 hours) at 09/30/2021 4320 Last data filed at 09/30/2021 0300 Gross per 24 hour  Intake 632.25 ml  Output --  Net 632.25 ml    Images 2/16 Stump demarcating, warm skin to touch No changes in total necrosis from 09/28/2021   Assessment/Planning: POD # 11 right BKA  History of non healing right GT wound followed by non healing TMA  No drainage, no edema or signs of active infection Will continue to observe she is at high risk of AKA   [name] 09/30/2021 9:06 AM --  Laboratory Lab Results: Recent Labs    09/30/21 0837  WBC 8.7  HGB 8.2*  HCT 28.4*  PLT 243    BMET Recent Labs    09/30/21 0838  NA 133*  K 3.7  CL 93*  CO2 25  GLUCOSE 235*  BUN 46*  CREATININE 6.88*  CALCIUM 8.3*     COAG Lab Results  Component Value Date   INR 1.2 08/10/2021   INR 1.1 06/30/2020   No results found for: PTT

## 2021-09-30 NOTE — Progress Notes (Signed)
Inpatient Rehab Admissions Coordinator:   I spoke with pt. Regarding potential CIR admit. She states interest, that her husband and sister can provide 24/7 support. Pt. Seems somewhat confused though oriented to person, place, and situation, so I will verify that plan with her sister, who Pt. Indicates is her Media planner.  Clemens Catholic, [place], [place] Admissions Coordinator  [phone] ([place]) [phone] (office)

## 2021-10-01 DIAGNOSIS — Z7189 Other specified counseling: Secondary | ICD-10-CM | POA: Diagnosis not present

## 2021-10-01 DIAGNOSIS — Z515 Encounter for palliative care: Secondary | ICD-10-CM | POA: Diagnosis not present

## 2021-10-01 DIAGNOSIS — I6340 Cerebral infarction due to embolism of unspecified cerebral artery: Secondary | ICD-10-CM | POA: Diagnosis not present

## 2021-10-01 LAB — GLUCOSE, CAPILLARY
Glucose-Capillary: 187 mg/dL — ABNORMAL HIGH (ref 70–99)
Glucose-Capillary: 140 mg/dL — ABNORMAL HIGH (ref 70–99)
Glucose-Capillary: 111 mg/dL — ABNORMAL HIGH (ref 70–99)
Glucose-Capillary: 38 mg/dL — CL (ref 70–99)
Glucose-Capillary: 46 mg/dL — ABNORMAL LOW (ref 70–99)
Glucose-Capillary: 173 mg/dL — ABNORMAL HIGH (ref 70–99)
Glucose-Capillary: 145 mg/dL — ABNORMAL HIGH (ref 70–99)
Glucose-Capillary: 101 mg/dL — ABNORMAL HIGH (ref 70–99)
Glucose-Capillary: 84 mg/dL (ref 70–99)

## 2021-10-01 MED ORDER — JUVEN PO PACK
1.0000 | PACK | Freq: Two times a day (BID) | ORAL | Status: DC
  Administered 2021-10-01 – 2021-10-03 (×4): 1 via ORAL
  Filled 2021-10-01 (×4): qty 1

## 2021-10-01 MED ORDER — CALCITRIOL 1 MCG/ML PO SOLN: 0.2500 ug | Freq: Two times a day (BID) | ORAL | Status: DC

## 2021-10-01 MED ORDER — OXYCODONE-ACETAMINOPHEN 5-325 MG PO TABS
1.0000 | ORAL_TABLET | Freq: Once | ORAL | Status: AC
  Administered 2021-10-01: 1 via ORAL
  Filled 2021-10-01: qty 1

## 2021-10-01 MED ORDER — SEVELAMER CARBONATE 0.8 G PO PACK
1.6000 g | PACK | Freq: Three times a day (TID) | ORAL | Status: DC
  Administered 2021-10-01 – 2021-10-20 (×36): 1.6 g via ORAL
  Filled 2021-10-01 (×60): qty 2

## 2021-10-01 MED ORDER — CALCITRIOL 0.25 MCG PO CAPS
0.2500 ug | ORAL_CAPSULE | Freq: Two times a day (BID) | ORAL | Status: DC
  Administered 2021-10-02 – 2021-10-04 (×5): 0.25 ug via ORAL
  Filled 2021-10-01 (×6): qty 1

## 2021-10-01 MED ORDER — RENA-VITE PO TABS
1.0000 | ORAL_TABLET | Freq: Every day | ORAL | Status: DC
  Administered 2021-10-03 – 2021-10-20 (×19): 1 via ORAL
  Filled 2021-10-01 (×19): qty 1

## 2021-10-01 MED ORDER — DEXTROSE 50 % IV SOLN
INTRAVENOUS | Status: AC
  Administered 2021-10-01: 50 mL
  Filled 2021-10-01: qty 50

## 2021-10-01 MED ORDER — INSULIN ASPART 100 UNIT/ML IJ SOLN
0.0000 [IU] | Freq: Three times a day (TID) | INTRAMUSCULAR | Status: DC
  Administered 2021-10-04: 13:00:00 2 [IU] via SUBCUTANEOUS
  Administered 2021-10-04: 18:00:00 1 [IU] via SUBCUTANEOUS
  Administered 2021-10-04: 3 [IU] via SUBCUTANEOUS
  Administered 2021-10-06: 08:00:00 2 [IU] via SUBCUTANEOUS
  Administered 2021-10-06 – 2021-10-07 (×3): 1 [IU] via SUBCUTANEOUS
  Administered 2021-10-07: 08:00:00 2 [IU] via SUBCUTANEOUS
  Administered 2021-10-08 (×2): 1 [IU] via SUBCUTANEOUS
  Administered 2021-10-09: 10:00:00 2 [IU] via SUBCUTANEOUS
  Administered 2021-10-09 (×2): 1 [IU] via SUBCUTANEOUS
  Administered 2021-10-10: 10:00:00 2 [IU] via SUBCUTANEOUS
  Administered 2021-10-15: 1 [IU] via SUBCUTANEOUS
  Administered 2021-10-17: 2 [IU] via SUBCUTANEOUS

## 2021-10-01 MED ORDER — NEPRO/CARBSTEADY PO LIQD
237.0000 mL | Freq: Two times a day (BID) | ORAL | Status: DC
  Administered 2021-10-02 – 2021-10-03 (×3): 237 mL via ORAL

## 2021-10-01 MED ORDER — ASPIRIN EC 325 MG PO TBEC
325.0000 mg | DELAYED_RELEASE_TABLET | Freq: Every day | ORAL | Status: DC
  Administered 2021-10-02: 325 mg via ORAL
  Filled 2021-10-01: qty 1

## 2021-10-01 MED ORDER — PANTOPRAZOLE 2 MG/ML SUSPENSION
40.0000 mg | Freq: Every day | ORAL | Status: DC
  Administered 2021-10-03 – 2021-10-20 (×15): 40 mg via ORAL
  Filled 2021-10-01 (×21): qty 20

## 2021-10-01 NOTE — Progress Notes (Signed)
Inpatient Rehab Admissions Coordinator:   I am following for potential CIR admit; however, I have not yet been able to contact Pt.'s sister, who is her primary caregiver, to confirm support at discharge. I will continue to follow for potential admit pending medical readiness, confirmation of dispo, and bed availability.  Clemens Catholic, [place], [place] Admissions Coordinator  [phone] ([place]) [phone] (office)

## 2021-10-01 NOTE — Progress Notes (Signed)
[facility] Progress Note   Subjective:  patient seen and examined bedside this am. S/p HD yesterday net uf 1428cc. Treatment terminated 30 min early--was howling loudly during HD. Otherwise no acute events overnight.  Objective Vitals:   09/30/21 2349 10/01/21 0100 10/01/21 0314 10/01/21 0500  BP: 140/62 (!) 133/47 (!) 109/55   Pulse: 90 84 90   Resp: 17 13 14    Temp: 98.7 F (37.1 C)  98.7 F (37.1 C)   TempSrc: Oral  Oral   SpO2: 95% 99% 99%   Weight:    58.4 kg  Height:       Physical Exam IEP:PIRJJOACZYS ill appearing HEENT: +ngt Heart:RRR Lungs:CTAB anterolaterally, no incd WOB Abdomen:soft, NTND Extremities: no LLE edema, R BKA  Neuro: awakens/opens eyes to vocal and tactile stimuli, following commands, speech incoherent Dialysis Access: RU AVF+ bruit  Filed Weights   09/30/21 0745 09/30/21 1030 10/01/21 0500  Weight: 61 kg 59.6 kg 58.4 kg    Intake/Output Summary (Last 24 hours) at 10/01/2021 0727 Last data filed at 10/01/2021 0535 Gross per 24 hour  Intake 1005.66 ml  Output 1428 ml  Net -422.34 ml    Labs: Basic Metabolic Panel: Recent Labs  Lab 09/26/21 0900 09/27/21 0129 09/30/21 0838  NA 130* 134* 133*  K 3.3* 3.8 3.7  CL 93* 96* 93*  CO2 21* 25 25  GLUCOSE 238* 132* 235*  BUN 71* 39* 46*  CREATININE 7.22* 5.35* 6.88*  CALCIUM 8.2* 8.4* 8.3*  PHOS 5.5* 4.6 4.8*     CBC: Recent Labs  Lab 09/26/21 0900 09/27/21 0129 09/30/21 0837  WBC 12.7* 12.6* 8.7  NEUTROABS 10.0* 8.9*  --   HGB 8.0* 9.2* 8.2*  HCT 27.7* 32.0* 28.4*  MCV 95.5 96.4 97.3  PLT 329 281 243   Recent Labs  Lab 09/30/21 1134 09/30/21 1637 09/30/21 1952 09/30/21 2352 10/01/21 0307  GLUCAP 124* 88 110* 172* 187*   Medications:  sodium chloride      aspirin  325 mg Per Tube Daily   calcitRIOL  0.25 mcg Per Tube BID   Chlorhexidine Gluconate Cloth  6 each Topical Q0600   cinacalcet  90 mg Oral Q supper   darbepoetin (ARANESP) injection -  DIALYSIS  200 mcg Intravenous Q Thu-HD   dextrose  25 mL Intravenous Once   dorzolamide-timolol  1 drop Left Eye BID   feeding supplement (NEPRO CARB STEADY)  1,000 mL Per Tube Q24H   free water  30 mL Per Tube Q6H   Gerhardt's butt cream   Topical TID   heparin injection (subcutaneous)  5,000 Units Subcutaneous Q8H   insulin aspart  0-6 Units Subcutaneous Q4H   insulin glargine-yfgn  16 Units Subcutaneous BID   lidocaine-prilocaine  1 application Topical UD   mouth rinse  15 mL Mouth Rinse BID   multivitamin  1 tablet Per Tube Daily   pantoprazole sodium  40 mg Per Tube Daily   sevelamer carbonate  1.6 g Per Tube TID WC      Dialysis Orders: [facility] 3h 38min   400/ 600   59.5kg   2/2 bath  Prof 2   RU AV fistula  Hep 4000  Mircera 200 mcg IV every 2 weeks - last 1/26 Ancef 2 g 3 times weekly HD   Assessment/Plan: Sepsis/ shock - BP's better, off of pressors. On midodrine. Off abx AMS - acute embolic CVA and/or watershed infarcts. With PFO on TTE. TEE planned for Monday. S/p IV  abx. 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 VVS following-she is high risk for AKA ESRD - on HD TTS.  Next HD 2/21. Try to keep SBP > 120 d/t #2.  Anemia of CKD- Hgb 9.2 on 2/15.  Aranesp 200 with HD on 2/9.  Transfuse pRBC prn MBD ckd - Home vdra, sensipar and renvela continued here. Phos acceptable HTN/volume - midodrine with HD. UF as tolerated w/ albumin support prn Severe protein calorie malnutrition -Cont protein supplements.  ngt in place. PEG to be pursued DMT2 - per PMD  Dispo - per primary, palliative care consulted  Gean Quint, MD [facility]

## 2021-10-01 NOTE — PMR Pre-admission (Shared)
PMR Admission Coordinator Pre-Admission Assessment  Patient: [name] is an 41 y.o., female MRN: [id number] DOB: 1981/07/07 Height: _0  (154.9 cm) Weight: 58.4 kg  Insurance Information HMO:     PPO:      PCP:      IPA:      80/20: yes     OTHER:  PRIMARY: Medicare A B      Policy#: [id number]      Subscriber: patient CM Name:        Phone#:       Fax#:   Pre-Cert#:        Employer: Disabled Benefits:  Phone #:       Name: Checked in passport one source Eff. Date: A=12/12/19 and B=04/02/21     Deduct: $1600      Out of Pocket Max: none      Life Max: N/A CIR: 100%      SNF: 100 days Outpatient: 80%     Co-Pay: 20% Home Health: 100%      Co-Pay: none DME: 80%     Co-Pay: 20% Providers: patient's choice  SECONDARY: Medicaid [place] Access      Policy#: [id number]     Phone#: [phone]  Financial Counselor:       Phone#:   The Data Collection Information Summary for patients in Inpatient Rehabilitation Facilities with attached Privacy Act [place] Records was provided and verbally reviewed with: Patient  Emergency Contact Information Contact Information     Name Relation Home Work Mobile   Berger,Victor Spouse   [phone]   Donney Rankins   [phone]       Current Medical History  Patient Admitting Diagnosis: CVA, BKA History of Present Illness: [name] is a 41 y.o. 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  She was not speaking to them. Pt. Was brought the [facility] Emergency Department 09/15/2021 and  found to be septic from prior R transmetatarsal amputation ~5 weeks ago. 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 On 2/10 a rapid response was called due to decreased responsiveness. MRI  revealed scattered punctate acute infarcts throughout both cerebral  hemispheres which may be embolic along with more confluent region of potentially subacute infarction in the left corona radiata. Pt. Seen by PT, OT and SLP and they recommended CIR to assist return to PLOF.     Patient's medical record from [place]  has been reviewed by the rehabilitation admission coordinator and physician.  Past Medical History  Past Medical History:  Diagnosis Date   Anemia    Anxiety    Blind right eye 2008   Diabetes mellitus without complication ([place])    Dialysis patient ([place])    ESRD (end stage renal disease) ([place])    Dialysis T/Th/Sa    Has the patient had major surgery during 100 days prior to admission? Yes  Family History   family history includes Diabetes in her father and mother.  Current Medications  Current Facility-Administered Medications:    Place/Maintain arterial line, , , Until Discontinued **AND** 0.9 %  sodium chloride infusion, , Intra-arterial, PRN, Spero Geralds, MD   acetaminophen (TYLENOL) tablet 650 mg, 650 mg, Oral, Q4H PRN, Debbe Odea, MD, 650 mg at 10/01/21 0826   alteplase (CATHFLO ACTIVASE)  injection 2 mg, 2 mg, Intracatheter, Once PRN, Spero Geralds, MD   alum & mag hydroxide-simeth (MAALOX/MYLANTA) 200-200-20 MG/5ML suspension 15-30 mL, 15-30 mL, Per Tube, Q2H PRN, Spero Geralds, MD   aspirin tablet 325 mg, 325 mg, Per Tube, Daily, Donnamae Jude, RPH, 325 mg at 10/01/21 6761   calcitRIOL (ROCALTROL) 1 MCG/ML solution 0.25 mcg, 0.25 mcg, Per Tube, BID, Rizwan, Saima, MD, 0.25 mcg at 10/01/21 0826   Chlorhexidine Gluconate Cloth 2 % PADS 6 each, 6 each, Topical, Q0600, Spero Geralds, MD, 6 each at 10/01/21 0525   cinacalcet (SENSIPAR) tablet 90 mg, 90 mg, Oral, Q supper, Spero Geralds, MD, 90 mg at 09/30/21 1703   Darbepoetin Alfa (ARANESP) injection 200 mcg, 200 mcg, Intravenous, Q Thu-HD, Spero Geralds, MD, 200 mcg at 09/28/21  0845   dextrose 50 % solution 25 mL, 25 mL, Intravenous, Once, Spero Geralds, MD   dorzolamide-timolol (COSOPT) 22.3-6.8 MG/ML ophthalmic solution 1 drop, 1 drop, Left Eye, BID, Spero Geralds, MD, 1 drop at 10/01/21 0837   free water 30 mL, 30 mL, Per Tube, Q6H, Spero Geralds, MD, 30 mL at 10/01/21 0525   Gerhardt's butt cream, , Topical, TID, Juanito Doom, MD, Given at 09/30/21 2140   guaiFENesin-dextromethorphan (ROBITUSSIN DM) 100-10 MG/5ML syrup 15 mL, 15 mL, Per Tube, Q4H PRN, Spero Geralds, MD, 15 mL at 10/01/21 0826   heparin injection 1,000 Units, 1,000 Units, Dialysis, PRN, Spero Geralds, MD   heparin injection 1,100 Units, 20 Units/kg, Dialysis, PRN, Spero Geralds, MD, 1,100 Units at 09/23/21 0920   heparin injection 5,000 Units, 5,000 Units, Subcutaneous, Q8H, Spero Geralds, MD, 5,000 Units at 10/01/21 0525   insulin aspart (novoLOG) injection 0-6 Units, 0-6 Units, Subcutaneous, Q4H, Spero Geralds, MD, 1 Units at 10/01/21 0308   insulin glargine-yfgn (SEMGLEE) injection 16 Units, 16 Units, Subcutaneous, BID, Rizwan, Saima, MD, 16 Units at 10/01/21 0837   iohexol (OMNIPAQUE) 300 MG/ML solution 75 mL, 75 mL, Intravenous, Once PRN, Barkley Boards R, PA-C   lidocaine (PF) (XYLOCAINE) 1 % injection 5 mL, 5 mL, Intradermal, PRN, Spero Geralds, MD   lidocaine-prilocaine (EMLA) cream 1 application, 1 application, Topical, UD, Shearon Stalls, Senaida Ores, MD   lidocaine-prilocaine (EMLA) cream 1 application, 1 application, Topical, PRN, Spero Geralds, MD   MEDLINE mouth rinse, 15 mL, Mouth Rinse, BID, Spero Geralds, MD, 15 mL at 09/30/21 2143   multivitamin (RENA-VIT) tablet 1 tablet, 1 tablet, Per Tube, Daily, Spero Geralds, MD, 1 tablet at 10/01/21 9509   nutrition supplement (JUVEN) (JUVEN) powder packet 1 packet, 1 packet, Oral, BID BM, Rizwan, Saima, MD   pantoprazole sodium (PROTONIX) 40 mg/20 mL oral suspension 40 mg, 40 mg, Per Tube, Daily, Spero Geralds, MD, 40 mg at  10/01/21 0827   pentafluoroprop-tetrafluoroeth (GEBAUERS) aerosol 1 application, 1 application, Topical, PRN, Spero Geralds, MD   phenol (CHLORASEPTIC) mouth spray 1 spray, 1 spray, Mouth/Throat, PRN, Spero Geralds, MD   sevelamer carbonate (RENVELA) packet 1.6 g, 1.6 g, Per Tube, TID WC, Spero Geralds, MD, 1.6 g at 10/01/21 0827  Patients Current Diet:  Diet Order             DIET DYS 2 Room service appropriate? Yes; Fluid consistency: Thin; Fluid restriction: 1200 mL Fluid  Diet effective now                   Precautions /  Restrictions Precautions Precautions: Fall Precaution Comments: limb protector RLE; mittens bil Restrictions Weight Bearing Restrictions: Yes RUE Weight Bearing: Weight bearing as tolerated RLE Weight Bearing: Non weight bearing RLE Partial Weight Bearing Percentage or Pounds: heel only   Has the patient had 2 or more falls or a fall with injury in the past year? Yes  Prior Activity Level Limited Community (1-2x/wk): Pt. went out for HD  Prior Functional Level Self Care: Did the patient need help bathing, dressing, using the toilet or eating? Needed some help  Indoor Mobility: Did the patient need assistance with walking from room to room (with or without device)? Needed some help  Stairs: Did the patient need assistance with internal or external stairs (with or without device)? Needed some help  Functional Cognition: Did the patient need help planning regular tasks such as shopping or remembering to take medications? Needed some help  Patient Information Are you of Hispanic, Latino/a,or Spanish origin?: A. No, not of Hispanic, Latino/a, or Spanish origin What is your race?: B. Black or African American Do you need or want an interpreter to communicate with a doctor or health care staff?: 0. No  Patient's Response To:  Health Literacy and Transportation Is the patient able to respond to health literacy and transportation needs?: Yes Health  Literacy - How often do you need to have someone help you when you read instructions, pamphlets, or other written material from your doctor or pharmacy?: Sometimes In the past 12 months, has lack of transportation kept you from medical appointments or from getting medications?: Yes In the past 12 months, has lack of transportation kept you from meetings, work, or from getting things needed for daily living?: Yes  Home Assistive Devices / Equipment Home Equipment: None  Prior Device Use: Indicate devices/aids used by the patient prior to current illness, exacerbation or injury? None of the above  Current Functional Level Cognition  Overall Cognitive Status: Impaired/Different from baseline Difficult to assess due to: Level of arousal Current Attention Level: Focused Orientation Level: Oriented to person, Oriented to place, Oriented to time, Disoriented to situation Following Commands: Follows one step commands consistently, Follows multi-step commands with increased time Safety/Judgement: Decreased awareness of safety, Decreased awareness of deficits General Comments: Pt able to state "hospital" and "[facility]" for place. "February" "2023." for situation she responded "I had a stroke." Pt also aware of R leg amputation. Eyes closed throughout session but responding to PT regularly, unable to actively bring eyelids open even when wiped clean. Blind right eye PTA per chart.  Poor awareness into her deficits and safety.  Slow processing of simple cues, but attempts to follow >80% of cues. Much more verbal each day, expressing desires.    Extremity Assessment (includes Sensation/Coordination)  Upper Extremity Assessment: Generalized weakness, RUE deficits/detail, Difficult to assess due to impaired cognition RUE Deficits / Details: edema noted througout. reached with right hand to weakly grab washcloth, loosely held even while utilizing washcloth RUE Coordination: decreased gross motor, decreased  fine motor  Lower Extremity Assessment: Defer to PT evaluation RLE Deficits / Details: Residual limb wrapped and limb protector donned    ADLs  Overall ADL's : Needs assistance/impaired Eating/Feeding: Total assistance, Bed level Grooming: Wash/dry face, Maximal assistance, Sitting Grooming Details (indicate cue type and reason): needs supported seating. max A for throughness. able to reach face with washcloth. holds washcloth with loose grip. Upper Body Bathing: Maximal assistance, Bed level Lower Body Bathing: Total assistance, Bed level Upper Body Dressing : Total  assistance, Bed level Lower Body Dressing: Total assistance, Bed level Toileting- Clothing Manipulation and Hygiene: Total assistance, Bed level General ADL Comments: Pt tolerated sitting EOB for several minutes. Worked on trunk control, head/neck posture, righting reactions, core strength.    Mobility  Overal bed mobility: Needs Assistance Bed Mobility: Rolling, Sidelying to Sit, Sit to Sidelying Rolling: Mod assist, Min assist Sidelying to sit: HOB elevated, Mod assist Supine to sit: HOB elevated, Mod assist Sit to supine: Max assist, +2 for physical assistance Sit to sidelying: Max assist, HOB elevated General bed mobility comments: Hand-over-hand guidance to bed rails to pull on to roll, varying needing min-modA to roll to R several times. Cleaned pt of BM prior to sitting at eOB. MaxA to initiate leg guidance towards EOB but then pt assisted, modA to lift trunk to sit. MaxA to bring legs onto bed and control trunk descent, cuing pt to lean laterally onto elbow.  Pt also did scoot to right x 1 prior to lying down with mod assist with pt struggling to follow commands to lean forward to unwiehgt hips and not using UEs as she should.    Transfers  Overall transfer level: Needs assistance Equipment used: Rolling walker (2 wheels) Transfers: Sit to/from Stand General transfer comment: Pt requesting to stand initially, got  ready with cues for pt to support self on PT with anterior approach and L knee block, but then pt suddenly became agitated and declined.    Ambulation / Gait / Stairs / Wheelchair Mobility  Ambulation/Gait General Gait Details: Unable    Posture / Balance Dynamic Sitting Balance Sitting balance - Comments: Pt varying from UE support to no UE support on bed, better stability when she would keep hands on bed though. Min to min guard assist majority of time for balance, providing verbal and tactile cues to find midline, anteriorly tilt pelvis, adduct scapulas, and lift head. Pt sat a total of 12 minutes with much better balance today than in previous session. She also kicked her left LE to command with delay and was moving her right LE as well. Balance Overall balance assessment: Needs assistance Sitting-balance support: Bilateral upper extremity supported, No upper extremity supported, Single extremity supported Sitting balance-Leahy Scale: Poor Sitting balance - Comments: Pt varying from UE support to no UE support on bed, better stability when she would keep hands on bed though. Min to min guard assist majority of time for balance, providing verbal and tactile cues to find midline, anteriorly tilt pelvis, adduct scapulas, and lift head. Pt sat a total of 12 minutes with much better balance today than in previous session. She also kicked her left LE to command with delay and was moving her right LE as well. Standing balance comment: unable    Special needs/care consideration Continuous Drip IV  None, Dialysis: Hemodialysis Tuesday, Thursday, and Saturday, Skin Sacrum, buttocks wounds, and Diabetes Mellitus - yes   Previous Home Environment (from acute therapy documentation) Living Arrangements: Spouse/significant other Available Help at Discharge: Family Type of Home: House Home Layout: Two level, Bed/bath upstairs Alternate Level Stairs-Rails: Right Alternate Level Stairs-Number of Steps: 1  flight Home Access: Level entry Bathroom Shower/Tub: Multimedia programmer: Standard Bathroom Accessibility: Yes How Accessible: Accessible via walker Additional Comments: All information taken from chart from one month ago, due to currently pt is inconsistent with answers or even answering questions. In speaking to RN she reports pt is currenlty separated from husband and sister [name] should be primary contact person  with all medical information and sister is trying to get guardianship of pt's son (1 yo).  Discharge Living Setting Plans for Discharge Living Setting: Patient's home Type of Home at Discharge: House Discharge Home Layout: Two level, Bed/bath upstairs Alternate Level Stairs-Rails: Right Alternate Level Stairs-Number of Steps: 1 flight Discharge Home Access: Level entry Discharge Bathroom Shower/Tub: Walk-in shower Discharge Bathroom Toilet: Standard Discharge Bathroom Accessibility: Yes How Accessible: Accessible via walker  Social/Family/Support Systems Patient Roles: Other (Comment) Contact Information: [phone] Anticipated Caregiver: Sister Fidel Levy Anticipated Caregiver's Contact Information: [phone] Ability/Limitations of Caregiver: Min A Caregiver Availability: 24/7 Discharge Plan Discussed with Primary Caregiver: Yes Is Caregiver In Agreement with Plan?: Yes  Goals Patient/Family Goal for Rehab: PT/OT/SLP Min A Expected length of stay: 18-21 days Pt/Family Agrees to Admission and willing to participate: Yes Program Orientation Provided & Reviewed with Pt/Caregiver Including Roles  & Responsibilities: Yes  Decrease burden of Care through IP rehab admission: Specialzed equipment needs, Diet advancement, Decrease number of caregivers, Bowel and bladder program, and Patient/family education  Possible need for SNF placement upon discharge: not anticipated  Patient Condition: I have reviewed medical records from [facility], spoken with CM, and patient and family member. I met with patient at the bedside for inpatient rehabilitation assessment.  Patient will benefit from ongoing PT, OT, and SLP, can actively participate in 3 hours of therapy a day 5 days of the week, and can make measurable gains during the admission.  Patient will also benefit from the coordinated team approach during an Inpatient Acute Rehabilitation admission.  The patient will receive intensive therapy as well as Rehabilitation physician, nursing, social worker, and care management interventions.  Due to bladder management, bowel management, safety, skin/wound care, disease management, medication administration, pain management, and patient education the patient requires 24 hour a day rehabilitation nursing.  The patient is currently max +2 to total assist +2 with mobility and basic ADLs.  Discharge setting and therapy post discharge at home with home health is anticipated.  Patient has agreed to participate in the Acute Inpatient Rehabilitation Program and will admit today.  Preadmission Screen Completed By:  Genella Mech, 10/01/2021 11:20 AM ______________________________________________________________________   Discussed status with Dr. Dagoberto Ligas on 10/05/21 at 0930 and received approval for admission today.  Admission Coordinator:  Genella Mech, CCC-SLP, time Marland KitchenSudie Grumbling ***   Assessment/Plan: Diagnosis: Does the need for close, 24 hr/day Medical supervision in concert with the patient's rehab needs make it unreasonable for this patient to be served in a less intensive setting? {yes_no_potentially:3041433} Co-Morbidities requiring supervision/potential complications: *** Due to {due XH:3716967}, does the patient require 24 hr/day rehab nursing? {yes_no_potentially:3041433} Does the patient require coordinated care of a physician, rehab nurse, PT, OT, and SLP to address physical and functional deficits in the context of the above medical  diagnosis(es)? {yes_no_potentially:3041433} Addressing deficits in the following areas: {deficits:3041436} Can the patient actively participate in an intensive therapy program of at least 3 hrs of therapy 5 days a week? {yes_no_potentially:3041433} The potential for patient to make measurable gains while on inpatient rehab is {potential:3041437} Anticipated functional outcomes upon discharge from inpatient rehab: {functional outcomes:304600100} PT, {functional outcomes:304600100} OT, {functional outcomes:304600100} SLP Estimated rehab length of stay to reach the above functional goals is: *** Anticipated discharge destination: {anticipated dc setting:21604} 10. Overall Rehab/Functional Prognosis: {potential:3041437}   MD Signature: ***

## 2021-10-01 NOTE — Progress Notes (Signed)
Per Dr. Wynelle Cleveland, pt has started to eat solid foods and no longer requires G-tube. Order cancelled.    Narda Rutherford, AGNP-BC 10/01/2021, 10:28 AM

## 2021-10-01 NOTE — Progress Notes (Signed)
° °  Palliative Medicine Inpatient Follow Up Note   HPI: 41 yo F PMH ESRD on TTS HD, DM2, Neuropathy, HTN, medical non-compliance presented, gangrene RLE to ED 09/15/21 from HD after pt became altered after HD session. Admitted to [facility] for further care. Imaging of R foot obtained which were concerning for osteomyelitis. VVS consulted and pt underwent R BKA 2/5.   Palliative care has been asked to get involved in the setting of multiple acute on chronic disease processes to further discuss goals of care.  Today's Discussion (10/01/2021):  *Please note that this is a verbal dictation therefore any spelling or grammatical errors are due to the "[place]" system interpretation.  Chart reviewed inclusive of vital signs, progress notes, laboratory results, and diagnostic images.   I spoke with patients RN, Danae Chen. She expresses a variety of concerns in terms of husbands presence and how there is a component of agrevation that appears to occur. Patient tends to "swat" at him. Concerns   I met with [name] at bedside this morning. She is vocal and does seem to be able to express the desire for her sister, "[name]" to be her primary Media planner. ______________________________________________ Addendum:  I met with [name] at bedside again this afternoon and I asked her how she was feeling.  She shares that she has a slight headache.  I again asked [name] who she would like to be her primary decision maker and she shared she would want her sister, Fidel Levy to make decisions for her.  Questions and concerns addressed   Palliative Support Provided _____________________________________________ Addendum #2:  I met with nursing and shared [name] has been consistent in who she would like to be her decision-maker.  Healthcare power of attorney forms were completed this afternoon.  Called Architectural technologist though phone went to voicemail will try again later.  Objective Assessment: Vital  Signs Vitals:   10/01/21 0800 10/01/21 1100  BP: (!) 143/50 (!) 157/53  Pulse: 89 84  Resp: 15 14  Temp:  98.6 F (37 C)  SpO2: 97% 100%    Intake/Output Summary (Last 24 hours) at 10/01/2021 1227 Last data filed at 10/01/2021 0900 Gross per 24 hour  Intake 1025.66 ml  Output --  Net 1025.66 ml   Last Weight  Most recent update: 10/01/2021  5:27 AM    Weight  58.4 kg (128 lb 12 oz)            Gen:  AA F in NAD HEENT: Dry mucous membranes CV: Regular rate and rhythm  PULM: On RA,  breathing even and nonlabored ABD: soft/nontender  EXT: R arm swelling, L BKA, (+) edema Neuro: Drowsy  SUMMARY OF RECOMMENDATIONS   Full code / Full Scope of Care  Plan for [place] of attorney forms completed and will be scanned into think a   Ongoing palliative care support  MDM - High ______________________________________________________________________________________ [place] Team Team Cell Phone: [phone] Please utilize secure chat with additional questions, if there is no response within 30 minutes please call the above phone number  Palliative Medicine Team providers are available by phone from 7am to 7pm daily and can be reached through the team cell phone.  Should this patient require assistance outside of these hours, please call the patient's attending physician.

## 2021-10-01 NOTE — Progress Notes (Signed)
Pt becoming more vocal. One and two word answers have progressed to short sentences, although still slurred. Pt endorsed hunger, ate percentage of breakfast(see flowsheet), and requested specific foods for lunch and dinner. Information relayed to Attending Provider Rizwan MD. RN requested JUVEN wound to help with BKA and MASD healing. Delay in swallowing still present, but patient tolerated oral feeing well. Suction not needed during encounter.

## 2021-10-01 NOTE — Progress Notes (Addendum)
Pt alert, requesting ice. Pt endorses she feels like her blood sugar is low. CBG checked. Pt had suspect critical CBG of 38. Immediate retest of 46. Pt drank half an apple juice and refused more. Administered D50 per protocol, see MAR. Retest CBG of 179. Pt stable.  Reggy Eye MD paged at 1400. Prompt callback. RN updated provider on patient's results, interventions, and current stable status.   When comparing yesterday to today - This RN is concerned that patient's combined caloric intake of tube feed has remained the same, with an increase in oral intake, with the same administration of short and long acting insulins(see MAR) and yet patient experienced a critical CBG. Pt has minimal change in mobilization or perceived metabolic needs of current state. Charge RN [employer] notified. To Err on the side of caution, CBG machine QC'd. Machine passed. Will recheck pt CBG.

## 2021-10-01 NOTE — Progress Notes (Signed)
° °  Palliative Medicine Inpatient Follow Up Note   Coordinated with patients RN, Danae Chen. She shares patient has stated wanting her sister, Fidel Levy to be her primary decision maker to multiple healthcare workers. I went by the room though patient was sleeping therefore unable to confirm this in the afternoon. _________________________________________ Addendum:  I went by patient room again around 1700 this evening, patient was vocal though initially used sisters nickname "Maudie Mercury" when asked who she would want to make decisions for her. I shared that I would be back tomorrow to check on this again as I need to verify the patient is consistent in who she wants to be her decision maker.   No Charge ______________________________________________________________________________________ [place] Team Team Cell Phone: [phone] Please utilize secure chat with additional questions, if there is no response within 30 minutes please call the above phone number  Palliative Medicine Team providers are available by phone from 7am to 7pm daily and can be reached through the team cell phone.  Should this patient require assistance outside of these hours, please call the patient's attending physician.

## 2021-10-01 NOTE — Progress Notes (Signed)
Progress Note   Patient: [name] QIH:[id number] DOB: 1980-12-02 DOA: 09/15/2021     16 DOS: the patient was seen and examined on 10/01/2021   Brief hospital course: [name] is a 41 y.o. female with medical history significant for poorly controlled DMT2, ESRD with HD on Tu,Th, Sat, neuropathy, HTN with poor compliance with medical treatment and HD was sent after completing dialysis session today due to staff concern for altered mental status because she was not speaking to them and "lethargy" per Triage note. 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 Grew MSSA and was receiving Ancef with dialysis.  In the ED, the patient initially acted as if she was unable to hear the admitting physician but then began to speak with her. Per history, her mental status had improved and no confusion was noted.  Xray of the right stump was suggestive of osteomyelitis.   2/4 Vascular surgery recommended a BKA and patient agreed.  2/5 underwent right BKA 2/6 appeared sleepy (kept eyes closed) 2/8 somnolent again on exam 2/9 still somnolent- volume removal limited by hypotension- Midodrine given  2/10 rapid response called for lethargy, minimally responsive to sternal rub- PCCM consulted, Neuro consulted - MRI obtained> scattered punctate acute infarcts bilaterally and confluen region in the left corona radiata. - CTA of head and neck: No large vessel occlusion. -  EEG >diffuse encephalopathy in addition to focal disturbance of cortical function in the right centrotemporal region - started on empiric antibiotics for possible endocarditis, ASA 325 mg - given pressors overnight  - 2/11- ECHO with bubble study > Agitated saline contrast bubble study was positive with shunting observed  within 3-6 cardiac cycles suggestive of interatrial shunt. , possible bicuspid aortic valve - NG tube placed and feeds started  2/12- more awake, not following commands  2/14-  Concerns for rectal vaginal fistula- CT abd/pelvis with rectal contrast was negative- Intrahepatic and extrahepatic biliary ductal dilatation with suggestion of mild gallbladder wall thickening - [place] abd showed a CBD of 9.8 mm  2/15- NG removed for SLP eval- D 2 diet with thin liquids recommended but due to waxing and waning mental status, a cor trak was placed  Assessment and Plan: Principal Problem:   Osteomyelitis ([place]) Active Problems:   Cerebral embolism with cerebral infarction   Anemia of chronic disease   Hyperglycemia due to type 2 diabetes mellitus ([place])   ESRD on hemodialysis (HCC)   Acute encephalopathy   Pressure injury of skin   PFO (patent foramen ovale)   Malnutrition of moderate degree    Osteomyelitis ([place])- (present on admission) - 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    Acute metabolic encephalopathy  - multifocal CVAs though to be secondary to embolism   - TTE bubble study + - TEE planned for Monday - on ASA 325 mg via tube - blood cultures from 2/10 are negative- dc Ceftriaxone on 2/15 - dc'd Tramadol 100 mg BID on 2/15 to see if mental status would improve  -  she is much more alert now and communicating appropriately- she keeps her eyes closed though- she is being fed and is eating almost 50% of meal- tube feeds have been discontinued to allow her appetite to increase- request for PEG tube discontinued- PT recommending CIR who has begun to evaluate her     PFO (patent foramen ovale) The patient is found to have a PFO on TTE     ESRD on hemodialysis ([place]) -  on HD TTS    Hypotension - resolved - dc'd Midodrine TID   Hyperglycemia due to type 2 diabetes mellitus ([place])- (present on admission) The patient's glucoses have been controlled with Semglee 10 units subcutaneous bid and q 4 hours SSI.  - as she is eating meals now and no longer receiving tube feeds, will change SSI to [facility]  Moderate Malnutrition - have started Juven and Nepro  shakes     Subjective: Speech is a little difficult to understand. She has no complaints.   Physical Exam: Vitals:   10/01/21 0314 10/01/21 0500 10/01/21 0700 10/01/21 0800  BP: (!) 109/55  (!) 133/92 (!) 143/50  Pulse: 90  87 89  Resp: 14  13 15   Temp: 98.7 F (37.1 C)  98.9 F (37.2 C)   TempSrc: Oral  Oral   SpO2: 99%  97% 97%  Weight:  58.4 kg    Height:       General exam: Appears comfortable  HEENT:  oral mucosa moist, no sclera icterus or thrush Respiratory system: Clear to auscultation. Respiratory effort normal. Cardiovascular system: S1 & S2 heard, regular rate and rhythm Gastrointestinal system: Abdomen soft, non-tender, nondistended. Normal bowel sounds   Extremities: No cyanosis, clubbing or edema- R BKA Skin: No rashes or ulcers Psychiatry:  Mood & affect appropriate.    Data Reviewed: reviewed      Family Communication: sister  Disposition: Status is: Inpatient  Remains inpatient appropriate because: follow oral intake and CIR consult    Planned Discharge Destination: Rehab     Time spent: 35 minutes  Author: Debbe Odea, MD 10/01/2021 11:18 AM  For on call review [email or web address].

## 2021-10-01 NOTE — H&P (View-Only) (Signed)
Progress Note   Patient: [name] DOB: [birth year] DOA: 09/15/2021     16 DOS: the patient was seen and examined on 10/01/2021   Brief hospital course: [name] is a 41 y.o. female with medical history significant for poorly controlled DMT2, ESRD with HD on Tu,Th, Sat, neuropathy, HTN with poor compliance with medical treatment and HD was sent after completing dialysis session today due to staff concern for altered mental status because she was not speaking to them and "lethargy" per Triage note. 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 Grew MSSA and was receiving Ancef with dialysis.  In the ED, the patient initially acted as if she was unable to hear the admitting physician but then began to speak with her. Per history, her mental status had improved and no confusion was noted.  Xray of the right stump was suggestive of osteomyelitis.   2/4 Vascular surgery recommended a BKA and patient agreed.  2/5 underwent right BKA 2/6 appeared sleepy (kept eyes closed) 2/8 somnolent again on exam 2/9 still somnolent- volume removal limited by hypotension- Midodrine given  2/10 rapid response called for lethargy, minimally responsive to sternal rub- PCCM consulted, Neuro consulted - MRI obtained> scattered punctate acute infarcts bilaterally and confluen region in the left corona radiata. - CTA of head and neck: No large vessel occlusion. -  EEG >diffuse encephalopathy in addition to focal disturbance of cortical function in the right centrotemporal region - started on empiric antibiotics for possible endocarditis, ASA 325 mg - given pressors overnight  - 2/11- ECHO with bubble study > Agitated saline contrast bubble study was positive with shunting observed  within 3-6 cardiac cycles suggestive of interatrial shunt. , possible bicuspid aortic valve - NG tube placed and feeds started  2/12- more awake, not following commands  2/14-  Concerns for rectal vaginal fistula- CT abd/pelvis with rectal contrast was negative- Intrahepatic and extrahepatic biliary ductal dilatation with suggestion of mild gallbladder wall thickening - [place] abd showed a CBD of 9.8 mm  2/15- NG removed for SLP eval- D 2 diet with thin liquids recommended but due to waxing and waning mental status, a cor trak was placed  Assessment and Plan: Principal Problem:   Osteomyelitis ([place]) Active Problems:   Cerebral embolism with cerebral infarction   Anemia of chronic disease   Hyperglycemia due to type 2 diabetes mellitus ([place])   ESRD on hemodialysis (HCC)   Acute encephalopathy   Pressure injury of skin   PFO (patent foramen ovale)   Malnutrition of moderate degree    Osteomyelitis ([place])- (present on admission) - 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    Acute metabolic encephalopathy  - multifocal CVAs though to be secondary to embolism   - TTE bubble study + - TEE planned for Monday - on ASA 325 mg via tube - blood cultures from 2/10 are negative- dc Ceftriaxone on 2/15 - dc'd Tramadol 100 mg BID on 2/15 to see if mental status would improve  -  she is much more alert now and communicating appropriately- she keeps her eyes closed though- she is being fed and is eating almost 50% of meal- tube feeds have been discontinued to allow her appetite to increase- request for PEG tube discontinued- PT recommending CIR who has begun to evaluate her     PFO (patent foramen ovale) The patient is found to have a PFO on TTE     ESRD on hemodialysis ([place]) -  on HD TTS    Hypotension - resolved - dc'd Midodrine TID   Hyperglycemia due to type 2 diabetes mellitus ([place])- (present on admission) The patient's glucoses have been controlled with Semglee 10 units subcutaneous bid and q 4 hours SSI.  - as she is eating meals now and no longer receiving tube feeds, will change SSI to [facility]  Moderate Malnutrition - have started Juven and Nepro  shakes     Subjective: Speech is a little difficult to understand. She has no complaints.   Physical Exam: Vitals:   10/01/21 0314 10/01/21 0500 10/01/21 0700 10/01/21 0800  BP: (!) 109/55  (!) 133/92 (!) 143/50  Pulse: 90  87 89  Resp: 14  13 15   Temp: 98.7 F (37.1 C)  98.9 F (37.2 C)   TempSrc: Oral  Oral   SpO2: 99%  97% 97%  Weight:  58.4 kg    Height:       General exam: Appears comfortable  HEENT:  oral mucosa moist, no sclera icterus or thrush Respiratory system: Clear to auscultation. Respiratory effort normal. Cardiovascular system: S1 & S2 heard, regular rate and rhythm Gastrointestinal system: Abdomen soft, non-tender, nondistended. Normal bowel sounds   Extremities: No cyanosis, clubbing or edema- R BKA Skin: No rashes or ulcers Psychiatry:  Mood & affect appropriate.    Data Reviewed: reviewed      Family Communication: sister  Disposition: Status is: Inpatient  Remains inpatient appropriate because: follow oral intake and CIR consult    Planned Discharge Destination: Rehab     Time spent: 35 minutes  Author: Debbe Odea, MD 10/01/2021 11:18 AM  For on call review [email or web address].

## 2021-10-01 NOTE — Plan of Care (Signed)
°  Problem: Pain Managment: Goal: General experience of comfort will improve Outcome: Progressing   Problem: Safety: Goal: Ability to remain free from injury will improve Outcome: Progressing   Problem: Skin Integrity: Goal: Risk for impaired skin integrity will decrease Outcome: Progressing   Problem: Clinical Measurements: Goal: Postoperative complications will be avoided or minimized Outcome: Progressing   Problem: Education: Goal: Knowledge of General Education information will improve Description: Including pain rating scale, medication(s)/side effects and non-pharmacologic comfort measures Outcome: Not Progressing   Problem: Health Behavior/Discharge Planning: Goal: Ability to manage health-related needs will improve Outcome: Not Progressing   Problem: Clinical Measurements: Goal: Ability to maintain clinical measurements within normal limits will improve Outcome: Not Progressing Goal: Diagnostic test results will improve Outcome: Not Progressing   Problem: Clinical Measurements: Goal: Ability to maintain clinical measurements within normal limits will improve Outcome: Not Progressing   Problem: Skin Integrity: Goal: Demonstration of wound healing without infection will improve Outcome: Not Progressing

## 2021-10-02 ENCOUNTER — Encounter (HOSPITAL_COMMUNITY): Payer: Self-pay | Admitting: Internal Medicine

## 2021-10-02 ENCOUNTER — Other Ambulatory Visit: Payer: Self-pay

## 2021-10-02 ENCOUNTER — Inpatient Hospital Stay (HOSPITAL_COMMUNITY): Payer: Medicare Other | Admitting: Certified Registered"

## 2021-10-02 ENCOUNTER — Encounter (HOSPITAL_COMMUNITY)
Admission: EM | Disposition: A | Discharge status: Other Acute Inpatient Hospital | Payer: Self-pay | Source: Home / Self Care | Attending: Internal Medicine

## 2021-10-02 ENCOUNTER — Inpatient Hospital Stay (HOSPITAL_COMMUNITY): Payer: Medicare Other

## 2021-10-02 DIAGNOSIS — I120 Hypertensive chronic kidney disease with stage 5 chronic kidney disease or end stage renal disease: Secondary | ICD-10-CM

## 2021-10-02 DIAGNOSIS — Q2112 Patent foramen ovale: Secondary | ICD-10-CM | POA: Diagnosis not present

## 2021-10-02 DIAGNOSIS — I340 Nonrheumatic mitral (valve) insufficiency: Secondary | ICD-10-CM | POA: Diagnosis not present

## 2021-10-02 DIAGNOSIS — E1122 Type 2 diabetes mellitus with diabetic chronic kidney disease: Secondary | ICD-10-CM

## 2021-10-02 DIAGNOSIS — Z515 Encounter for palliative care: Secondary | ICD-10-CM | POA: Diagnosis not present

## 2021-10-02 DIAGNOSIS — I6340 Cerebral infarction due to embolism of unspecified cerebral artery: Secondary | ICD-10-CM | POA: Diagnosis not present

## 2021-10-02 DIAGNOSIS — Z7189 Other specified counseling: Secondary | ICD-10-CM | POA: Diagnosis not present

## 2021-10-02 HISTORY — PX: BUBBLE STUDY: SHX6837

## 2021-10-02 HISTORY — PX: TEE WITHOUT CARDIOVERSION: SHX5443

## 2021-10-02 LAB — GLUCOSE, CAPILLARY
Glucose-Capillary: 42 mg/dL — CL (ref 70–99)
Glucose-Capillary: 170 mg/dL — ABNORMAL HIGH (ref 70–99)
Glucose-Capillary: 145 mg/dL — ABNORMAL HIGH (ref 70–99)
Glucose-Capillary: 65 mg/dL — ABNORMAL LOW (ref 70–99)
Glucose-Capillary: 100 mg/dL — ABNORMAL HIGH (ref 70–99)
Glucose-Capillary: 76 mg/dL (ref 70–99)
Glucose-Capillary: 45 mg/dL — ABNORMAL LOW (ref 70–99)
Glucose-Capillary: 109 mg/dL — ABNORMAL HIGH (ref 70–99)
Glucose-Capillary: 70 mg/dL (ref 70–99)
Glucose-Capillary: 65 mg/dL — ABNORMAL LOW (ref 70–99)
Glucose-Capillary: 92 mg/dL (ref 70–99)
Glucose-Capillary: 151 mg/dL — ABNORMAL HIGH (ref 70–99)

## 2021-10-02 SURGERY — ECHOCARDIOGRAM, TRANSESOPHAGEAL
Anesthesia: Monitor Anesthesia Care

## 2021-10-02 MED ORDER — PROPOFOL 10 MG/ML IV BOLUS
INTRAVENOUS | Status: DC | PRN
  Administered 2021-10-02: 30 mg via INTRAVENOUS

## 2021-10-02 MED ORDER — DEXTROSE 50 % IV SOLN
INTRAVENOUS | Status: AC
  Administered 2021-10-02: 25 mL via INTRAVENOUS
  Filled 2021-10-02: qty 50

## 2021-10-02 MED ORDER — DEXTROSE 50 % IV SOLN
INTRAVENOUS | Status: AC
  Filled 2021-10-02: qty 50

## 2021-10-02 MED ORDER — DEXTROSE 50 % IV SOLN: 25.0000 g | INTRAVENOUS | Status: AC

## 2021-10-02 MED ORDER — LIDOCAINE 2% (20 MG/ML) 5 ML SYRINGE
INTRAMUSCULAR | Status: DC | PRN
  Administered 2021-10-02: 40 mg via INTRAVENOUS

## 2021-10-02 MED ORDER — SODIUM CHLORIDE 0.9 % IV SOLN: INTRAVENOUS | Status: DC | PRN

## 2021-10-02 MED ORDER — DEXTROSE 50 % IV SOLN
INTRAVENOUS | Status: AC
  Administered 2021-10-02: 25 g via INTRAVENOUS
  Filled 2021-10-02: qty 50

## 2021-10-02 MED ORDER — PROPOFOL 500 MG/50ML IV EMUL
INTRAVENOUS | Status: DC | PRN
  Administered 2021-10-02: 100 ug/kg/min via INTRAVENOUS

## 2021-10-02 MED ORDER — DEXTROSE 50 % IV SOLN
1.0000 | Freq: Once | INTRAVENOUS | Status: AC
Start: 2021-10-02 — End: 2021-10-02
  Administered 2021-10-02: 25 mL via INTRAVENOUS

## 2021-10-02 MED ORDER — INSULIN GLARGINE-YFGN 100 UNIT/ML ~~LOC~~ SOLN
16.0000 [IU] | Freq: Every day | SUBCUTANEOUS | Status: DC
  Administered 2021-10-03: 16 [IU] via SUBCUTANEOUS
  Filled 2021-10-02 (×3): qty 0.16

## 2021-10-02 NOTE — Progress Notes (Addendum)
° °  Palliative Medicine Inpatient Follow Up Note  Chart reviewed.  From a Palliative perspective goals at this time are clear for full scope of treatment.  HCPOA documents completed yesterday to reflect Architectural technologist as primary Media planner.   [name] updated via phone.  Please call if any additional care needs arise.  No Charge ______________________________________________________________________________________ [place] Team Team Cell Phone: [phone] Please utilize secure chat with additional questions, if there is no response within 30 minutes please call the above phone number  Palliative Medicine Team providers are available by phone from 7am to 7pm daily and can be reached through the team cell phone.  Should this patient require assistance outside of these hours, please call the patient's attending physician.

## 2021-10-02 NOTE — Progress Notes (Signed)
Physical Therapy Treatment Patient Details Name: [name] MRN: [id number] DOB: 1980-09-26 Today's Date: 10/02/2021   History of Present Illness [name] is a 41 y.o. female who was sent after completing dialysis session 09/15/21 due to staff concern for altered mental status.  She was not speaking to them. 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    PT Comments    Second attempt at pt today due to TEE this AM that left pt groggy. Pt noted to have soiled linens from BM and skin breakdown at sacrum, RN notified and assisted as +2 for mobility and bed change. Mod-Max assist to move supine>EOB and pt required +2 for safety at start with min-mod assist to balance EOB. Lateral leans for forearm prop and trunk extension completed to obtain upright posture as pt had tendency to slouch into anterior lean. Pt limited by lethargy throughout but able to make her needs known and responding with extra time to state she is alright. She will benefit from intense therapy follow up at CIR level to progress independence with mobility.    Recommendations for follow up therapy are one component of a multi-disciplinary discharge planning process, led by the attending physician.  Recommendations may be updated based on patient status, additional functional criteria and insurance authorization.  Follow Up Recommendations  Acute inpatient rehab (3hours/day)     Assistance Recommended at Discharge Frequent or constant Supervision/Assistance  Patient can return home with the following Two  people to help with walking and/or transfers;Two people to help with bathing/dressing/bathroom;A lot of help with bathing/dressing/bathroom;A lot of help with walking and/or transfers;Assistance with cooking/housework;Assistance with feeding;Direct supervision/assist for medications management;Direct supervision/assist for financial management;Assist for transportation;Help with stairs or ramp for entrance   Equipment Recommendations  BSC/3in1;Wheelchair (measurements PT);Wheelchair cushion (measurements PT);Hospital bed;Other (comment)    Recommendations for Other Services Rehab consult     Precautions / Restrictions Precautions Precautions: Fall Precaution Comments: limb protector RLE Required Braces or Orthoses: Other Brace Restrictions Weight Bearing Restrictions: Yes RUE Weight Bearing: Weight bearing as tolerated RLE Weight Bearing: Non weight bearing     Mobility  Bed Mobility Overal bed mobility: Needs Assistance Bed Mobility: Rolling, Sidelying to Sit, Sit to Supine Rolling: Mod assist Sidelying to sit: HOB elevated, Mod assist, Max assist   Sit to supine: Mod assist, Max assist   General bed mobility comments: Mod assist to roll with multimodal cues to reach for bed rail. assist to bring Rt/Lt LE over to complete roll both directions. Mod-Max assist to press trunk upright to sitting EOB. pt unable to scoot at EOB to move anterior or lateral. pt returned to supine wiht cue to move to Lt forearm then to shoulder and mod-max assist to bring LE's onto bed and roll supine. +2 to boost in bed.    Transfers                        Ambulation/Gait                   Stairs  Wheelchair Mobility    Modified Rankin (Stroke Patients Only)       Balance Overall balance assessment: Needs assistance Sitting-balance support: Bilateral upper extremity supported, No upper extremity supported, Single extremity supported Sitting balance-Leahy  Scale: Poor Sitting balance - Comments: min-mod assist at times for seated balance and pt reliant on UE support at EOB. pt completed lateral lean/forearm prop Rt/Lt to facilitate bed linen change due to BM.                                    Cognition Arousal/Alertness: Awake/alert Behavior During Therapy: Flat affect, Impulsive Overall Cognitive Status: Impaired/Different from baseline Area of Impairment: Orientation, Attention, Following commands, Safety/judgement, Awareness, Problem solving, Memory                 Orientation Level: Disoriented to, Situation Current Attention Level: Focused Memory: Decreased recall of precautions, Decreased short-term memory Following Commands: Follows one step commands with increased time, Follows one step commands inconsistently Safety/Judgement: Decreased awareness of safety, Decreased awareness of deficits Awareness: Intellectual Problem Solving: Slow processing, Decreased initiation, Difficulty sequencing, Requires verbal cues, Requires tactile cues          Exercises      General Comments        Pertinent Vitals/Pain Pain Assessment Pain Assessment: Faces Faces Pain Scale: Hurts little more Pain Location: bottom with pericare from RN Pain Descriptors / Indicators: Discomfort, Grimacing, Moaning Pain Intervention(s): Limited activity within patient's tolerance, Monitored during session, Repositioned    Home Living                          Prior Function            PT Goals (current goals can now be found in the care plan section) Acute Rehab PT Goals PT Goal Formulation: With patient Time For Goal Achievement: 10/14/21 Potential to Achieve Goals: Fair Progress towards PT goals: Progressing toward goals    Frequency    Min 4X/week      PT Plan Current plan remains appropriate    Co-evaluation              AM-PAC PT "6 Clicks" Mobility   Outcome Measure  Help needed turning  from your back to your side while in a flat bed without using bedrails?: A Lot Help needed moving from lying on your back to sitting on the side of a flat bed without using bedrails?: A Lot Help needed moving to and from a bed to a chair (including a wheelchair)?: Total Help needed standing up from a chair using your arms (e.g., wheelchair or bedside chair)?: Total Help needed to walk in hospital room?: Total Help needed climbing 3-5 steps with a railing? : Total 6 Click Score: 8    End of Session   Activity Tolerance: Patient tolerated treatment well;Patient limited by fatigue Patient left: in bed;with call bell/phone within reach;with bed alarm set Nurse Communication: Mobility status PT Visit Diagnosis: Other abnormalities of gait and mobility (R26.89);Muscle weakness (generalized) (M62.81);Difficulty in walking, not elsewhere classified (R26.2);Other symptoms and signs involving the nervous system (R29.898) Pain - Right/Left: Right Pain - part of body: Leg     Time: 1341-1415 PT Time Calculation (min) (ACUTE ONLY): 34 min  Charges:  $Therapeutic Activity: 23-37 mins  Verner Mould, DPT Acute Rehabilitation Services Office [phone] Pager [phone]    Jacques Navy 10/02/2021, 2:33 PM

## 2021-10-02 NOTE — Progress Notes (Signed)
°  Echocardiogram Echocardiogram Transesophageal has been performed.  [name] 10/02/2021, 8:47 AM

## 2021-10-02 NOTE — Progress Notes (Signed)
[facility] Progress Note   Subjective:  patient seen in room , s/p TEE this am. Lethargic post procedure.   Objective Vitals:   10/02/21 0845 10/02/21 0857 10/02/21 0900 10/02/21 1514  BP:  (!) 163/78 (!) 150/67 (!) 148/36  Pulse: 93 92 86 80  Resp: 20 12 14    Temp:  98.2 F (36.8 C) (!) 97.4 F (36.3 C) 98.1 F (36.7 C)  TempSrc:   Oral Oral  SpO2: 96% 93% 99% 100%  Weight:      Height:       Physical Exam GMW:[id number] ill appearing HEENT: +ngt Heart:RRR Lungs:CTAB anterolaterally, no incd WOB Abdomen:soft, NTND Extremities: no LLE edema, R BKA  Neuro: awakens/opens eyes to vocal and tactile stimuli, following commands, speech incoherent Dialysis Access: RU AVF+ bruit  Filed Weights   09/30/21 1030 10/01/21 0500 10/02/21 0730  Weight: 59.6 kg 58.4 kg 58.4 kg    Intake/Output Summary (Last 24 hours) at 10/02/2021 1635 Last data filed at 10/02/2021 0837 Gross per 24 hour  Intake 300 ml  Output --  Net 300 ml     Labs: Basic Metabolic Panel: Recent Labs  Lab 09/26/21 0900 09/27/21 0129 09/30/21 0838  NA 130* 134* 133*  K 3.3* 3.8 3.7  CL 93* 96* 93*  CO2 21* 25 25  GLUCOSE 238* 132* 235*  BUN 71* 39* 46*  CREATININE 7.22* 5.35* 6.88*  CALCIUM 8.2* 8.4* 8.3*  PHOS 5.5* 4.6 4.8*      CBC: Recent Labs  Lab 09/26/21 0900 09/27/21 0129 09/30/21 0837  WBC 12.7* 12.6* 8.7  NEUTROABS 10.0* 8.9*  --   HGB 8.0* 9.2* 8.2*  HCT 27.7* 32.0* 28.4*  MCV 95.5 96.4 97.3  PLT 329 281 243    Recent Labs  Lab 10/02/21 0925 10/02/21 1119 10/02/21 1150 10/02/21 1441 10/02/21 1612  GLUCAP 76 45* 109* 70 65*    Medications:    aspirin EC  325 mg Oral Daily   calcitRIOL  0.25 mcg Oral BID   Chlorhexidine Gluconate Cloth  6 each Topical Q0600   cinacalcet  90 mg Oral Q supper   darbepoetin (ARANESP) injection - DIALYSIS  200 mcg Intravenous Q Thu-HD   dorzolamide-timolol  1 drop Left Eye BID   feeding supplement (NEPRO CARB  STEADY)  237 mL Oral BID BM   Gerhardt's butt cream   Topical TID   heparin injection (subcutaneous)  5,000 Units Subcutaneous Q8H   insulin aspart  0-6 Units Subcutaneous TID WC   insulin glargine-yfgn  16 Units Subcutaneous Daily   lidocaine-prilocaine  1 application Topical UD   mouth rinse  15 mL Mouth Rinse BID   multivitamin  1 tablet Oral QHS   nutrition supplement (JUVEN)  1 packet Oral BID BM   pantoprazole sodium  40 mg Oral Daily   sevelamer carbonate  1.6 g Oral TID WC      OP HD: [place] TTS 3h 81min   400/ 600   59.5kg   2/2 bath  Prof 2   RU AV fistula  Hep 4000  Mircera 200 mcg IV every 2 weeks - last 1/26 Ancef 2 g 3 times weekly HD   Assessment/Plan: AMS - acute embolic CVA and/or watershed infarcts. With PFO on TTE. TEE today. S/p IV abx. Neuro on board Sepsis/ shock - resolved  R foot osteomyelitis - s/p R BKA 2/5 by VVS. VVS following-she is high risk for AKA ESRD - on HD TTS.  Next HD 2/21. Try  to keep SBP > 120 d/t #1.  Anemia of CKD- Hgb 9.2 on 2/15.  Aranesp 200 with HD on 2/9.  Transfuse pRBC prn MBD ckd - Home vdra, sensipar and renvela continued here. Phos acceptable HTN/volume - midodrine with HD. UF as tolerated w/ albumin support prn. Close to dry wt.  Severe protein calorie malnutrition -Cont protein supplements.  ngt in place. PEG to be pursued DMT2 - per PMD  Dispo - per primary, palliative care consulted  Kelly Splinter, MD 10/02/2021, 4:36 PM

## 2021-10-02 NOTE — Plan of Care (Signed)

## 2021-10-02 NOTE — Anesthesia Preprocedure Evaluation (Signed)
Anesthesia Evaluation  Patient identified by MRN, date of birth, ID band  Reviewed: Allergy & Precautions, NPO status , Patient's Chart, lab work & pertinent test results  Airway Mallampati: Unable to assess       Dental   Pulmonary neg pulmonary ROS,    Pulmonary exam normal        Cardiovascular hypertension, Pt. on medications + Peripheral Vascular Disease  Normal cardiovascular exam     Neuro/Psych PSYCHIATRIC DISORDERS Anxiety CVA    GI/Hepatic negative GI ROS, Neg liver ROS,   Endo/Other  diabetes, Insulin Dependent  Renal/GU ESRF and DialysisRenal disease     Musculoskeletal negative musculoskeletal ROS (+)   Abdominal   Peds  Hematology  (+) Blood dyscrasia, anemia ,   Anesthesia Other Findings bacteremia  Reproductive/Obstetrics                             Anesthesia Physical Anesthesia Plan  ASA: 3  Anesthesia Plan: MAC   Post-op Pain Management:    Induction:   PONV Risk Score and Plan: 2 and Propofol infusion and Treatment may vary due to age or medical condition  Airway Management Planned: Nasal Cannula  Additional Equipment:   Intra-op Plan:   Post-operative Plan:   Informed Consent:   Plan Discussed with: CRNA  Anesthesia Plan Comments:         Anesthesia Quick Evaluation

## 2021-10-02 NOTE — Transfer of Care (Signed)
Immediate Anesthesia Transfer of Care Note  Patient: [name]  Procedure(s) Performed: TRANSESOPHAGEAL ECHOCARDIOGRAM (TEE) BUBBLE STUDY  Patient Location: PACU  Anesthesia Type:MAC  Level of Consciousness: drowsy and patient cooperative  Airway & Oxygen Therapy: Patient Spontanous Breathing and Patient connected to nasal cannula oxygen  Post-op Assessment: Report given to RN, Post -op Vital signs reviewed and stable and Patient moving all extremities  Post vital signs: Reviewed and stable  Last Vitals:  Vitals Value Taken Time  BP 148/96 10/02/21 0842  Temp    Pulse 95 10/02/21 0843  Resp 16 10/02/21 0843  SpO2 94 % 10/02/21 0843  Vitals shown include unvalidated device data.  Last Pain:  Vitals:   10/02/21 0730  TempSrc: Temporal  PainSc: 0-No pain      Patients Stated Pain Goal: 0 (24/23/53 6144)  Complications: No notable events documented.

## 2021-10-02 NOTE — Interval H&P Note (Signed)
History and Physical Interval Note:  10/02/2021 7:26 AM  [name]  has presented today for surgery, with the diagnosis of bacteremia.  The various methods of treatment have been discussed with the patient and family. After consideration of risks, benefits and other options for treatment, the patient has consented to  Procedure(s): TRANSESOPHAGEAL ECHOCARDIOGRAM (TEE) (N/A) as a surgical intervention.  The patient's history has been reviewed, patient examined, no change in status, stable for surgery.  I have reviewed the patient's chart and labs.  Questions were answered to the patient's satisfaction.     [name]

## 2021-10-02 NOTE — CV Procedure (Signed)
TEE   Indication:   CVA  Patient sedated by anesthesia with Propofol intravenously Throat numbed with lidocaine Bite guard placed  TEE probe advanced to mid esophagus without difficulty  Patient had very sensitive airway with tendency toward bronchospasm TEE probe removed once for spasm with desaturation.   WHen sats improved TEE probe reinserted and advanced to mid esophagus without difficulty.    At end of procedure desaturated again and procedure ended.   Sats recovered quickly   LA, LAA without masses Very small PFO as tested with injection of agitated saline with few bubbles seen in LA.   Not clearly visible with color doppler   AV mildly thickened (noncoronary cusp)   No obvious vegetation.  No AI TV normal   No TR MV normal   Mild MR PV normal  No PI  LVEF and RVEF normal  Aortic root normal  Triv pericardial effusion   No signficant complications.  [name] MD

## 2021-10-02 NOTE — Progress Notes (Signed)
Progress Note   Patient: [name] ZOX:[id number] DOB: 1980/10/02 DOA: 09/15/2021     17 DOS: the patient was seen and examined on 10/02/2021   Brief hospital course: [name] is a 41 y.o. female with medical history significant for poorly controlled DMT2, ESRD with HD on Tu,Th, Sat, neuropathy, HTN with poor compliance with medical treatment and HD was sent after completing dialysis session today due to staff concern for altered mental status because she was not speaking to them and "lethargy" per Triage note. 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 Grew MSSA and was receiving Ancef with dialysis.  In the ED, the patient initially acted as if she was unable to hear the admitting physician but then began to speak with her. Per history, her mental status had improved and no confusion was noted.  Xray of the right stump was suggestive of osteomyelitis.   2/4 Vascular surgery recommended a BKA and patient agreed.  2/5 underwent right BKA 2/6 appeared sleepy (kept eyes closed) 2/8 somnolent again on exam 2/9 still somnolent- volume removal limited by hypotension- Midodrine given  2/10 rapid response called for lethargy, minimally responsive to sternal rub- PCCM consulted, Neuro consulted - MRI obtained> scattered punctate acute infarcts bilaterally and confluen region in the left corona radiata. - CTA of head and neck: No large vessel occlusion. -  EEG >diffuse encephalopathy in addition to focal disturbance of cortical function in the right centrotemporal region - started on empiric antibiotics for possible endocarditis, ASA 325 mg - given pressors overnight  - 2/11- ECHO with bubble study > Agitated saline contrast bubble study was positive with shunting observed  within 3-6 cardiac cycles suggestive of interatrial shunt. , possible bicuspid aortic valve - NG tube placed and feeds started  2/12- more awake, not following commands  2/14-  Concerns for rectal vaginal fistula- CT abd/pelvis with rectal contrast was negative- Intrahepatic and extrahepatic biliary ductal dilatation with suggestion of mild gallbladder wall thickening - [place] abd showed a CBD of 9.8 mm  2/15- NG removed for SLP eval- D 2 diet with thin liquids recommended but due to waxing and waning mental status, a cor trak was placed  Assessment and Plan: No notes have been filed under this hospital service. 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    Acute metabolic encephalopathy  - multifocal CVAs though to be secondary to embolism   - TTE bubble study + - TEE shows a very small PFO and no thrombus - on ASA 325 mg   - blood cultures from 2/10 are negative- dc Ceftriaxone on 2/15 - dc'd Tramadol 100 mg BID on 2/15 to see if mental status would improve  -  she is much more alert now and communicating - speech is difficult to understand & she keeps her eyes closed  - she is being fed and is eating almost 50% of meal- tube feeds have been discontinued to allow her appetite to increase- request for PEG tube discontinued - PT recommending CIR who has begun to evaluate her - I have requested Neuro to make final recommendations now that she has had the TEE     PFO (patent foramen ovale) The patient is found to have a PFO on TTE and TEE     ESRD on hemodialysis ([place]) - on HD TTS    Hypotension - resolved - dc'd Midodrine TID   Hyperglycemia due to  type 2 diabetes mellitus (HCC)-    - cont Semglee 16 U QHS and low dose SSI with meals   Moderate Malnutrition mild fat depletion, moderate muscle depletion - have started Juven and Nepro shakes in between meals         Subjective: no complaints  Physical Exam: Vitals:   10/02/21 0842 10/02/21 0845 10/02/21 0857 10/02/21 0900  BP: (!) 148/96  (!) 163/78 (!) 150/67  Pulse: 93 93 92 86  Resp: 15 20 12 14   Temp: 98.2 F (36.8 C)   98.2 F (36.8 C) (!) 97.4 F (36.3 C)  TempSrc:    Oral  SpO2: 92% 96% 93% 99%  Weight:      Height:       General exam: Appears comfortable  HEENT:  no sclera icterus or thrush Respiratory system: Clear to auscultation. Respiratory effort normal. Cardiovascular system: S1 & S2 heard, regular rate and rhythm Gastrointestinal system: Abdomen soft, non-tender, nondistended. Normal bowel sounds        Family Communication: sister  Disposition: Status is: Inpatient  Remains inpatient appropriate because: Medically stable now - awaiting CIR    Planned Discharge Destination: Rehab     Time spent: 35 minutes  Author: Debbe Odea, MD 10/02/2021 12:55 PM  For on call review [email or web address].

## 2021-10-02 NOTE — Progress Notes (Signed)
°  Progress Note    10/02/2021 9:37 AM  Subjective:  sleepy but answers questions.  Says she does not have any pain in the amputation site.  Says she is hungry.   Afebrile  Vitals:   10/02/21 0845 10/02/21 0857  BP:  (!) 163/78  Pulse: 93 92  Resp: 20 12  Temp:  98.2 F (36.8 C)  SpO2: 96% 93%    Physical Exam: Incisions:         CBC    Component Value Date/Time   WBC 8.7 09/30/2021 0837   RBC 2.92 (L) 09/30/2021 0837   HGB 8.2 (L) 09/30/2021 0837   HGB 10.6 (L) 05/18/2020 0927   HCT 28.4 (L) 09/30/2021 0837   HCT 35.1 05/18/2020 0927   PLT 243 09/30/2021 0837   PLT 186 05/18/2020 0927   MCV 97.3 09/30/2021 0837   MCV 89 05/18/2020 0927   MCH 28.1 09/30/2021 0837   MCHC 28.9 (L) 09/30/2021 0837   RDW 26.5 (H) 09/30/2021 0837   RDW 13.9 05/18/2020 0927   LYMPHSABS 1.9 09/27/2021 0129   LYMPHSABS 1.6 05/18/2020 0927   MONOABS 1.3 (H) 09/27/2021 0129   EOSABS 0.4 09/27/2021 0129   EOSABS 0.6 (H) 05/18/2020 0927   BASOSABS 0.1 09/27/2021 0129   BASOSABS 0.0 05/18/2020 0927    BMET    Component Value Date/Time   NA 133 (L) 09/30/2021 0838   NA 133 (L) 05/18/2020 0927   K 3.7 09/30/2021 0838   CL 93 (L) 09/30/2021 0838   CO2 25 09/30/2021 0838   GLUCOSE 235 (H) 09/30/2021 0838   BUN 46 (H) 09/30/2021 0838   BUN 45 (H) 05/18/2020 0927   CREATININE 6.88 (H) 09/30/2021 0838   CALCIUM 8.3 (L) 09/30/2021 0838   GFRNONAA 7 (L) 09/30/2021 0838   GFRAA 6 (L) 05/18/2020 0927    INR    Component Value Date/Time   INR 1.2 08/10/2021 2212     Intake/Output Summary (Last 24 hours) at 10/02/2021 0937 Last data filed at 10/02/2021 0837 Gross per 24 hour  Intake 300 ml  Output --  Net 300 ml     Assessment/Plan:  41 y.o. female is s/p right below knee amputation   15 Days Post-Op  -pt's right BKA stump looks essentially unchanged from last week.  It is warm to touch.  Appears to have scant mild bloody drainage from the mid portion. -good ROM-able to  lift leg and move as directed. -may need revision or I&D in the future but given it is unchanged, will continue to monitor for now.  She is afebrile and no leukocytosis.   -will keep limb guard off for now.    [name], PA-C [facility] [phone] 10/02/2021 9:37 AM

## 2021-10-02 NOTE — Interval H&P Note (Signed)
History and Physical Interval Note:  10/02/2021 8:00 AM  [name]  has presented today for surgery, with the diagnosis of bacteremia.  The various methods of treatment have been discussed with the patient and family. After consideration of risks, benefits and other options for treatment, the patient has consented to  Procedure(s): TRANSESOPHAGEAL ECHOCARDIOGRAM (TEE) (N/A) as a surgical intervention.  The patient's history has been reviewed, patient examined, no change in status, stable for surgery.  I have reviewed the patient's chart and labs.  Questions were answered to the patient's satisfaction.     [name]

## 2021-10-02 NOTE — OR Nursing (Signed)
Hypoglycemic Event  CBG: 65  Treatment: D50 25 mL (12.5 gm)  Symptoms: None  Follow-up CBG: Time:841 CBG Result:100  Possible Reasons for Event: Other: npo for procedure  Comments/MD notified:yes    [place], Advertising account planner

## 2021-10-02 NOTE — Plan of Care (Signed)
°  Problem: Clinical Measurements: Goal: Postoperative complications will be avoided or minimized Outcome: Progressing   Problem: Education: Goal: Knowledge of General Education information will improve Description: Including pain rating scale, medication(s)/side effects and non-pharmacologic comfort measures Outcome: Not Progressing   Problem: Health Behavior/Discharge Planning: Goal: Ability to manage health-related needs will improve Outcome: Not Progressing   Problem: Clinical Measurements: Goal: Ability to maintain clinical measurements within normal limits will improve Outcome: Not Progressing Goal: Diagnostic test results will improve Outcome: Not Progressing   Problem: Pain Managment: Goal: General experience of comfort will improve Outcome: Not Progressing   Problem: Safety: Goal: Ability to remain free from injury will improve Outcome: Not Progressing   Problem: Skin Integrity: Goal: Risk for impaired skin integrity will decrease Outcome: Not Progressing   Problem: Clinical Measurements: Goal: Ability to maintain clinical measurements within normal limits will improve Outcome: Not Progressing   Problem: Skin Integrity: Goal: Demonstration of wound healing without infection will improve Outcome: Not Progressing

## 2021-10-02 NOTE — Progress Notes (Signed)
Inpatient Rehab Admissions Coordinator:   I do not have a CIR bed for this Pt. Today. I have so far been unable to speak with Pt.'s sister [name] to confirm disposition and caregiver support. I will follow for potential admission pending bed availability, medical readiness, and confirmation of support.   Clemens Catholic, [place], [place] Admissions Coordinator  [phone] ([place]) [phone] (office)

## 2021-10-03 DIAGNOSIS — G9340 Encephalopathy, unspecified: Secondary | ICD-10-CM | POA: Diagnosis not present

## 2021-10-03 DIAGNOSIS — I639 Cerebral infarction, unspecified: Secondary | ICD-10-CM | POA: Diagnosis not present

## 2021-10-03 DIAGNOSIS — R1312 Dysphagia, oropharyngeal phase: Secondary | ICD-10-CM

## 2021-10-03 DIAGNOSIS — N186 End stage renal disease: Secondary | ICD-10-CM | POA: Diagnosis not present

## 2021-10-03 DIAGNOSIS — I6340 Cerebral infarction due to embolism of unspecified cerebral artery: Secondary | ICD-10-CM | POA: Diagnosis not present

## 2021-10-03 LAB — CBC
HCT: 29.1 % — ABNORMAL LOW (ref 36.0–46.0)
Hemoglobin: 8.8 g/dL — ABNORMAL LOW (ref 12.0–15.0)
MCH: 29 pg (ref 26.0–34.0)
MCHC: 30.2 g/dL (ref 30.0–36.0)
MCV: 96 fL (ref 80.0–100.0)
Platelets: 290 10*3/uL (ref 150–400)
RBC: 3.03 MIL/uL — ABNORMAL LOW (ref 3.87–5.11)
RDW: 26 % — ABNORMAL HIGH (ref 11.5–15.5)
WBC: 11.8 10*3/uL — ABNORMAL HIGH (ref 4.0–10.5)
nRBC: 0.7 % — ABNORMAL HIGH (ref 0.0–0.2)

## 2021-10-03 LAB — COMPREHENSIVE METABOLIC PANEL
ALT: 15 U/L (ref 0–44)
AST: 24 U/L (ref 15–41)
Albumin: 2 g/dL — ABNORMAL LOW (ref 3.5–5.0)
Alkaline Phosphatase: 92 U/L (ref 38–126)
Anion gap: 14 (ref 5–15)
BUN: 65 mg/dL — ABNORMAL HIGH (ref 6–20)
CO2: 24 mmol/L (ref 22–32)
Calcium: 9 mg/dL (ref 8.9–10.3)
Chloride: 90 mmol/L — ABNORMAL LOW (ref 98–111)
Creatinine, Ser: 7.9 mg/dL — ABNORMAL HIGH (ref 0.44–1.00)
GFR, Estimated: 6 mL/min — ABNORMAL LOW (ref >60)
Glucose, Bld: 100 mg/dL — ABNORMAL HIGH (ref 70–99)
Potassium: 5 mmol/L (ref 3.5–5.1)
Sodium: 128 mmol/L — ABNORMAL LOW (ref 135–145)
Total Bilirubin: 0.6 mg/dL (ref 0.3–1.2)
Total Protein: 7.2 g/dL (ref 6.5–8.1)

## 2021-10-03 LAB — GLUCOSE, CAPILLARY
Glucose-Capillary: 106 mg/dL — ABNORMAL HIGH (ref 70–99)
Glucose-Capillary: 153 mg/dL — ABNORMAL HIGH (ref 70–99)
Glucose-Capillary: 172 mg/dL — ABNORMAL HIGH (ref 70–99)
Glucose-Capillary: 215 mg/dL — ABNORMAL HIGH (ref 70–99)
Glucose-Capillary: 57 mg/dL — ABNORMAL LOW (ref 70–99)
Glucose-Capillary: 63 mg/dL — ABNORMAL LOW (ref 70–99)
Glucose-Capillary: 87 mg/dL (ref 70–99)
Glucose-Capillary: 99 mg/dL (ref 70–99)

## 2021-10-03 LAB — MAGNESIUM: Magnesium: 2.3 mg/dL (ref 1.7–2.4)

## 2021-10-03 LAB — PHOSPHORUS: Phosphorus: 5.8 mg/dL — ABNORMAL HIGH (ref 2.5–4.6)

## 2021-10-03 MED ORDER — HEPARIN SODIUM (PORCINE) 1000 UNIT/ML DIALYSIS
2000.0000 [IU] | INTRAMUSCULAR | Status: DC | PRN
  Administered 2021-10-03: 2000 [IU] via INTRAVENOUS_CENTRAL
  Filled 2021-10-03: qty 2

## 2021-10-03 MED ORDER — CLOPIDOGREL BISULFATE 75 MG PO TABS
75.0000 mg | ORAL_TABLET | Freq: Every day | ORAL | Status: DC
  Administered 2021-10-03 – 2021-10-12 (×9): 75 mg via ORAL
  Filled 2021-10-03 (×9): qty 1

## 2021-10-03 MED ORDER — INSULIN GLARGINE-YFGN 100 UNIT/ML ~~LOC~~ SOLN
16.0000 [IU] | Freq: Every day | SUBCUTANEOUS | Status: DC
  Administered 2021-10-03 – 2021-10-08 (×6): 16 [IU] via SUBCUTANEOUS
  Filled 2021-10-03 (×7): qty 0.16

## 2021-10-03 MED ORDER — JUVEN PO PACK
1.0000 | PACK | Freq: Two times a day (BID) | ORAL | Status: DC
  Administered 2021-10-04 – 2021-10-10 (×11): 1
  Filled 2021-10-03 (×11): qty 1

## 2021-10-03 MED ORDER — DEXTROSE 50 % IV SOLN
12.5000 g | INTRAVENOUS | Status: AC
  Administered 2021-10-03: 12.5 g via INTRAVENOUS

## 2021-10-03 MED ORDER — NEPRO/CARBSTEADY PO LIQD
780.0000 mL | ORAL | Status: DC
  Administered 2021-10-03 – 2021-10-09 (×7): 780 mL
  Filled 2021-10-03 (×6): qty 948

## 2021-10-03 MED ORDER — ATORVASTATIN CALCIUM 40 MG PO TABS
40.0000 mg | ORAL_TABLET | Freq: Every day | ORAL | Status: DC
  Administered 2021-10-03 – 2021-10-20 (×16): 40 mg via ORAL
  Filled 2021-10-03 (×16): qty 1

## 2021-10-03 MED ORDER — ASPIRIN EC 81 MG PO TBEC
81.0000 mg | DELAYED_RELEASE_TABLET | Freq: Every day | ORAL | Status: DC
  Administered 2021-10-03 – 2021-10-09 (×7): 81 mg via ORAL
  Filled 2021-10-03 (×7): qty 1

## 2021-10-03 NOTE — Progress Notes (Signed)
Speech Language Pathology Treatment: Dysphagia  Patient Details Name: [name] MRN: [id number] DOB: 1981-06-16 Today's Date: 10/03/2021 Time: 6295-2841 SLP Time Calculation (min) (ACUTE ONLY): 21 min  Assessment / Plan / Recommendation Clinical Impression  Pt Pt was seen sitting upright in her chair, with lunch tray next to her with what appears to be picked at just a little. Pt needed encouragement to take any additional bites/sips but did ultimately do so. Oral transit was prolonged but no overt pocketing was noted. RN reports pocketing earlier tofay though. One delayed cough was observed that could be suggestive of reduced ariway protection, but given that it was not observed again, would recommend to continue with current diet. Suspect that her intake and safety both have the potential to fluctuate as her mentation does, but with improvements in mentation she could be appropriate for more advanced solids.    HPI HPI: 41 y.o. female presented to ED 2/3 with AMS, gangrene RLE and osteomyelitis.  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 Further change in mentation 2/10; MRI brain showed scattered punctate acute infarcts throughout both cerebral hemispheres. Pt was on renal diet with 1200 ml fluid restriction prior to CVA, made NPO 2/11 and large bore NG was placed. 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 Palliative care has been consulted in the setting of multiple acute on chronic disease processes to further discuss goals of care. Swallow eval ordered 2/13.      SLP Plan  Continue with current plan of care      Recommendations for follow up therapy are one component of a multi-disciplinary discharge planning process, led by the attending physician.  Recommendations may be updated based on patient status, additional functional criteria and insurance  authorization.    Recommendations  Diet recommendations: Dysphagia 2 (fine chop);Thin liquid Liquids provided via: Cup;Straw Medication Administration: Whole meds with puree Supervision: Full supervision/cueing for compensatory strategies Compensations: Slow rate;Small sips/bites Postural Changes and/or Swallow Maneuvers: Seated upright 90 degrees;Upright 30-60 min after meal                Oral Care Recommendations: Oral care QID Follow Up Recommendations: Skilled nursing-short term rehab (<3 hours/day) Assistance recommended at discharge: Frequent or constant Supervision/Assistance SLP Visit Diagnosis: Dysphagia, unspecified (R13.10);Dysphagia, oral phase (R13.11) Plan: Continue with current plan of care           Osie Bond., M.A. [place] [facility] Pager [phone] Office [phone]  10/03/2021, 2:38 PM

## 2021-10-03 NOTE — Anesthesia Postprocedure Evaluation (Signed)
Anesthesia Post Note  Patient: [name]  Procedure(s) Performed: TRANSESOPHAGEAL ECHOCARDIOGRAM (TEE) BUBBLE STUDY     Patient location during evaluation: Endoscopy Anesthesia Type: MAC Level of consciousness: awake Pain management: pain level controlled Vital Signs Assessment: post-procedure vital signs reviewed and stable Respiratory status: spontaneous breathing, nonlabored ventilation, respiratory function stable and patient connected to nasal cannula oxygen Cardiovascular status: stable and blood pressure returned to baseline Postop Assessment: no apparent nausea or vomiting Anesthetic complications: no   No notable events documented.  Last Vitals:  Vitals:   10/02/21 2300 10/03/21 0721  BP: (!) 129/50   Pulse: 90   Resp: 18   Temp: 36.9 C   SpO2: 100% 100%    Last Pain:  Vitals:   10/03/21 0721  TempSrc:   PainSc: 0-No pain                 Oiva Dibari P Camala Talwar

## 2021-10-03 NOTE — Progress Notes (Signed)
[facility] Progress Note   Subjective:  patient seen on HD, drowsy as usual  Objective Vitals:   10/03/21 1000 10/03/21 1030 10/03/21 1057 10/03/21 1149  BP: 90/75 (!) 134/48 (!) 110/54 (!) 140/57  Pulse: 100 91 88 92  Resp:   14 16  Temp:   97.9 F (36.6 C)   TempSrc:   Oral   SpO2:      Weight:   56.8 kg   Height:       Physical Exam YBO:FBPZWCHENID ill appearing. Drowsy, slurred speech HEENT: +ngt Heart:RRR Lungs:CTAB anterolaterally, no incd WOB Abdomen:soft, NTND Extremities: no LLE edema, R BKA  Dialysis Access: RU AVF+ bruit    OP HD: [place] TTS 3h 62min   400/ 600   59.5kg   2/2 bath  Prof 2   RU AV fistula  Hep 4000  Mircera 200 mcg IV every 2 weeks - last 1/26 Ancef 2 g 3 times weekly HD Covid neg 2/3, hep B SAg neg 2/4   Assessment/Plan: AMS - acute embolic CVA and/or watershed infarcts. With PFO on TTE. TEE showed small PFO and no clot. BCxs were neg. S/p course of IV abx.  SP R BKA - on 2/5 by VVS. At risk for AKA ESRD - HD TTS.  HD today. Trying to keep SBP > 120 on HD due to #1.  Sepsis/ shock - resolved  Anemia of CKD- Hgb 9.2 on 2/15.  Aranesp 200 with HD on 2/9.  Transfuse pRBC prn MBD ckd - Home vdra, sensipar and renvela continued here. Phos acceptable HTN/volume - midodrine with HD. UF as tolerated w/ albumin support prn. Close to dry wt.  Severe protein calorie malnutrition -Cont protein supplements.  ngt in place. PEG to be pursued DMT2 - per PMD  Dispo - prob dc to SNF  Kelly Splinter, MD 10/03/2021, 2:51 PM Filed Weights   10/02/21 0730 10/03/21 0724 10/03/21 1057  Weight: 58.4 kg 58.6 kg 56.8 kg    Intake/Output Summary (Last 24 hours) at 10/03/2021 1451 Last data filed at 10/03/2021 1317 Gross per 24 hour  Intake 25 ml  Output 1850 ml  Net -1825 ml     Labs: Basic Metabolic Panel: Recent Labs  Lab 09/27/21 0129 09/30/21 0838 10/03/21 0804  NA 134* 133* 128*  K 3.8 3.7 5.0  CL 96* 93* 90*  CO2 25 25 24    GLUCOSE 132* 235* 100*  BUN 39* 46* 65*  CREATININE 5.35* 6.88* 7.90*  CALCIUM 8.4* 8.3* 9.0  PHOS 4.6 4.8*  --       CBC: Recent Labs  Lab 09/27/21 0129 09/30/21 0837 10/03/21 0804  WBC 12.6* 8.7 11.8*  NEUTROABS 8.9*  --   --   HGB 9.2* 8.2* 8.8*  HCT 32.0* 28.4* 29.1*  MCV 96.4 97.3 96.0  PLT 281 243 290    Recent Labs  Lab 10/02/21 2148 10/03/21 0642 10/03/21 0705 10/03/21 0931 10/03/21 1148  GLUCAP 151* 57* 106* 87 63*    Medications:    aspirin EC  81 mg Oral Daily   atorvastatin  40 mg Oral Daily   calcitRIOL  0.25 mcg Oral BID   Chlorhexidine Gluconate Cloth  6 each Topical Q0600   cinacalcet  90 mg Oral Q supper   clopidogrel  75 mg Oral Daily   darbepoetin (ARANESP) injection - DIALYSIS  200 mcg Intravenous Q Thu-HD   dorzolamide-timolol  1 drop Left Eye BID   Gerhardt's butt cream   Topical TID  heparin injection (subcutaneous)  5,000 Units Subcutaneous Q8H   insulin aspart  0-6 Units Subcutaneous TID WC   insulin glargine-yfgn  16 Units Subcutaneous QHS   lidocaine-prilocaine  1 application Topical UD   mouth rinse  15 mL Mouth Rinse BID   multivitamin  1 tablet Oral QHS   [START ON 10/04/2021] nutrition supplement (JUVEN)  1 packet Per Tube BID BM   pantoprazole sodium  40 mg Oral Daily   sevelamer carbonate  1.6 g Oral TID WC

## 2021-10-03 NOTE — Progress Notes (Signed)
Inpatient Rehab Admissions Coordinator:   I do not have a CIR bed for this Pt. Today. Discussed case with rehab MD, would like to see increased participation with therapies. PT/OT aware and plan to attempt transfers to chair today. I will continue to follow for potential admit pending bed availability.   Clemens Catholic, [place], [place] Admissions Coordinator  [phone] ([place]) [phone] (office)

## 2021-10-03 NOTE — Progress Notes (Addendum)
STROKE TEAM PROGRESS NOTE   INTERVAL HISTORY  Patient seen in dialysis today. She is laying with her eyes closed. She does not open her eyes to voice/command, but is able to nod head appropriately and follow commands with her upper extremities. She would not speak to me on exam and did express that she is tired when asked. She nodded in agreement to attempting to participate in therapies and agreed with the importance of this.   Vitals:   10/03/21 1000 10/03/21 1030 10/03/21 1057 10/03/21 1149  BP: 90/75 (!) 134/48 (!) 110/54 (!) 140/57  Pulse: 100 91 88 92  Resp:   14 16  Temp:   97.9 F (36.6 C)   TempSrc:   Oral   SpO2:      Weight:   56.8 kg   Height:       CBC:  Recent Labs  Lab 09/27/21 0129 09/30/21 0837 10/03/21 0804  WBC 12.6* 8.7 11.8*  NEUTROABS 8.9*  --   --   HGB 9.2* 8.2* 8.8*  HCT 32.0* 28.4* 29.1*  MCV 96.4 97.3 96.0  PLT 281 243 235    Basic Metabolic Panel:  Recent Labs  Lab 09/27/21 0129 09/30/21 0838 10/03/21 0804  NA 134* 133* 128*  K 3.8 3.7 5.0  CL 96* 93* 90*  CO2 25 25 24   GLUCOSE 132* 235* 100*  BUN 39* 46* 65*  CREATININE 5.35* 6.88* 7.90*  CALCIUM 8.4* 8.3* 9.0  PHOS 4.6 4.8*  --     Lipid Panel: No results for input(s): CHOL, TRIG, HDL, CHOLHDL, VLDL, LDLCALC in the last 168 hours. HgbA1c: No results for input(s): HGBA1C in the last 168 hours. Urine Drug Screen: No results for input(s): LABOPIA, COCAINSCRNUR, LABBENZ, AMPHETMU, THCU, LABBARB in the last 168 hours.  Alcohol Level No results for input(s): ETH in the last 168 hours.  IMAGING past 24 hours DG Abd 1 View  Result Date: 10/02/2021 CLINICAL DATA:  Dysphagia, feeding tube placement. EXAM: ABDOMEN - 1 VIEW COMPARISON:  Plain film dated 09/27/2021. FINDINGS: Weighted tip feeding tube in place with tip directed towards the expected location of the stomach pylorus/proximal duodenum. Visualized bowel gas pattern is nonobstructive. No evidence of free intraperitoneal air. Lung  bases appear clear. IMPRESSION: Weighted tip feeding tube in the distal stomach with tip directed towards the expected location of the stomach pylorus/proximal duodenum. Electronically Signed   By: Franki Cabot M.D.   On: 10/02/2021 13:40    PHYSICAL EXAM GENERAL: 50 African-American lady laying comfortably in hospital bed, in no acute distress, on dialysis LUNGS: Normal respiratory effort. Non-labored breathing on room air CV: Regular rate and rhythm on telemetry Musculoskeletal : Right below-knee amputation NEURO:  Mental Status: drowsy, does not open eyes to stimuli throughout, will follow commands with upper extremities. She did not answer orientation questions for this examiner, but she did nod her head appropriately Cranial Nerves:  II: Right pupil is nonreactive to direct light, is 3 mm and irregular. The left pupil is 5 mm, irregular, and sluggishly reactive to light.  III, IV, VI: Does not open eyes, does not fixate or track examiner.  Eyes are dysconjugate with mild right eye exotropia and left eye hyportropia. V: Does not blink to threat throughout   VII: Face appears symmetric resting and with movement.  VIII: Unable to assess due to patient's condition IX, X: Does not cough spontaneously, does not allow for visualization of patient's soft palate XI: Head is grossly midline XII: Does not  protrude tongue Motor:  Purposeful movement in upper extremities, generalized weakness in upper extremities. Withdraws right lower extremity noxious stimuli but not left lower extremity.  She is able to maintain tone with left lower extremity when flexed at the hip. Right BKA. Tone and bulk are normal.  Sensation: grossly intact Gait: Deferred  ASSESSMENT/PLAN [name] is a 41 y.o. female with history of ESRD on HD T/Th/Sat, type 2 diabetes mellitus with neuropathy, hypertension, osteomyelitis with BKA, right eye blindness, and medical non-adherence presenting with  lethargy and  AMS following HD on 2/3.  Initial work up in the ED revealed patient with leukocytosis concerning for infection and imaging with concern for osteomyelitis for which vascular surgery recommended admission for BKA on 2/5 and ongoing antibiotics. Of note, patient was discharged in January following urgent transmetatarsal amputation after being found with gangrenous toes s/p wound vac and IV Ancef after wound cultures grew MSSA. During hospitalization, patient's mental status initially improved and she was responding appropriately on evaluation on 2/4 with surgery for BKA on 2/5. On 2/6, she was noted to be somnolent and slow to respond but she was answering questions appropriately. On 2/7 patient had an episode of hypotension with HD but was noted to still be lethargic but followed commands and answered questions appropriately. Patient again had HD on 2/9 and became more lethargic, patient lethargic on exam during HD on 2/11. TEE completed- no thrombus detected, small PFO noted. Start atorvastatin, ASA 81mg  and plavix 75mg .  Stroke:  scattered punctate acute infarcts throughout both cerebral  hemispheres more on the left CR, embolic shower, etiology not quite clear CT head No acute abnormality. CTA head & neck - No large vessel occlusion. Intracranial atherosclerosis including moderate right and mild left ICA stenoses.  MRI - Scattered punctate acute infarcts throughout both cerebral hemispheres which may be embolic. More confluent region of potentially subacute infarction in the left corona radiata. Chronic ischemia with multiple chronic infarcts as above. 2D Echo EF 70-75%, hyperdynamic LV, evidence of atrial level shunt detected by color-flow Doppler (the bubble study positive) TEE - LA, LAA without masses Very small PFO as tested with injection of agitated saline with few bubbles seen in LA.   Not clearly visible with color doppler.  EEG- Mild generalized slowing. Focal slowing is seen intermittently in the  right centrotemporal region. No electrographic seizures were recorded.  LE venous Doppler no DVT LDL 97 HgbA1c 10.4 VTE prophylaxis - heparin subq No antithrombotic prior to admission, now on ASA 81mg  and Plavix 75mg   Therapy recommendations: CIR  Disposition: Pending  Bilateral ptosis Patient not open eyes for long time even follows all simple commands and answer question appropriately DDx including eye opening apraxia, polyneuropathy involving CN III IV VI, or critical illness neuropathy/myopathy However, not typical for either not able to maintain eye opening after forced eye opening, pupil sparing Continue supportive care  Hypertension Home meds:  Losartan Stable Avoid low BP Long-term BP goal normotensive Midodrine if needed  Hyperlipidemia Home meds: None LDL 97, goal < 70 LFT normalized Atorvastatin 40mg  added Continue statin on discharge  Diabetes type II Uncontrolled Home meds:  INsulin HgbA1c 8.8 goal < 7.0 CBGs SSI Close PCP follow-up for better DM control  Other Stroke Risk Factors Hx stroke/TIA Coronary artery disease  Other Active Problems Osteomyelitis BKA 2/5  End stage renal disease HD per nephrology  Cr 7.90 Leukocytosis - management by primary team WBC 11.8 Vancomycin, merrem Malnutrition Dietary/nutrition consult  Patient seen and  examined by NP/APP with MD. MD to update note as needed.   Tracey Ores, [name], [name] [facility] Pager: [phone]  ATTENDING NOTE: I reviewed above note and agree with the assessment and plan. Pt was seen and examined.   41 year old female with history of uncontrolled diabetes, hypertension, ESRD on dialysis, osteomyelitis status post right BKA, blindness admitted for altered mental status and lethargy, waxes and waning before and after BKA on 2/5.  CT head and neck showed only left ICA siphon moderate stenosis.  MRI showed embolic shower with punctate infarcts bilaterally more at left CR.  LE  venous Doppler negative.  EF 70 to 75%.  EEG no seizure.  TEE showed very small PFO no endocarditis.  LDL 97 and A1c 10.4.  Leukocytosis resolved and LFTs normalized.  On exam, patient husband at bedside, patient lying in bed, lethargic, not able to open eyes even with forced eye opening not able to maintain eye opening.  Very limited eye movement bilaterally, however pupil 2.5 mm bilaterally sluggish to light.  Bilateral facial weakness, tongue protrusion limited but midline.  Bilateral blindness, not able to have light perception.  Bilateral upper extremity however 3/5 no significant drift, right lower extremity BKA, however very limited movement bilateral lower extremity with pain.  Severe dysarthria, Sensation, coordination not corporative and gait not tested.  Patient stroke consistent with embolic shower, however, etiology not quite clear.  No endocarditis, very small PFO likely insignificant.  No DVT.  Could be related to hypercarbia state with uncontrolled diabetes and sepsis.  However, may consider 30-day CardioNet monitor as outpatient to rule out A-fib.  Patient persistent bilateral proptosis, etiology not quite clear either.  DDx including eye opening apraxia, polyneuropathy involving CN III, IV, V, and critical illness myopathy/neuropathy.  However, not typical for either of them.  Recommend continue aspirin and Plavix DAPT for 3 weeks and then aspirin alone.  Continue Lipitor.  Aggressive risk factor modification.  DC core track once p.o. adequate.  Encourage patient working hard with PT/OT.  For detailed assessment and plan, please refer to above as I have made changes wherever appropriate.   I had long discussion with husband at bedside, updated pt current condition, treatment plan and potential prognosis, and answered all the questions.  He expressed understanding and appreciation.  Discussed with Dr. Wynelle Cleveland.   Rosalin Hawking, MD PhD Stroke Neurology 10/03/2021 10:13 PM  I spent  extensive time with the patient, more than 50% of which was spent in counseling and coordination of care, reviewing test results, images and medication, and discussing the diagnosis, treatment plan and potential prognosis. This patient's care requiresreview of multiple databases, neurological assessment, discussion with family, other specialists and medical decision making of high complexity.  To contact Stroke Continuity provider, please refer to [email or web address] After hours, contact General Neurology

## 2021-10-03 NOTE — Progress Notes (Signed)
OT Cancellation Note  Patient Details Name: [name] MRN: [id number] DOB: 1981/03/23   Cancelled Treatment:    Reason Eval/Treat Not Completed: Patient at procedure or test/ unavailable  Joeseph Amor OTR/L  Acute Rehab Services  [phone] office number [phone] pager number  Joeseph Amor 10/03/2021, 8:30 AM

## 2021-10-03 NOTE — Progress Notes (Addendum)
Progress Note   Patient: [name] VWU:[id number] DOB: [birth year] DOA: 09/15/2021     18 DOS: the patient was seen and examined on 10/03/2021   Brief hospital course: Marc Sivertsen is a 41 y.o. female with medical history significant for poorly controlled DMT2, ESRD with HD on Tu,Th, Sat, neuropathy, HTN with poor compliance with medical treatment and HD was sent after completing dialysis session today due to staff concern for altered mental status because she was not speaking to them and "lethargy" per Triage note. 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 Grew MSSA and was receiving Ancef with dialysis.  In the ED, the patient initially acted as if she was unable to hear the admitting physician but then began to speak with her. Per history, her mental status had improved and no confusion was noted.  Xray of the right stump was suggestive of osteomyelitis.   2/4 Vascular surgery recommended a BKA and patient agreed.  2/5 underwent right BKA 2/6 appeared sleepy (kept eyes closed) 2/8 somnolent again on exam 2/9 still somnolent- volume removal limited by hypotension- Midodrine given  2/10 rapid response called for lethargy, minimally responsive to sternal rub- PCCM consulted, Neuro consulted - MRI obtained> scattered punctate acute infarcts bilaterally and confluen region in the left corona radiata. - CTA of head and neck: No large vessel occlusion. -  EEG >diffuse encephalopathy in addition to focal disturbance of cortical function in the right centrotemporal region - started on empiric antibiotics for possible endocarditis, ASA 325 mg - given pressors overnight  - 2/11- ECHO with bubble study > Agitated saline contrast bubble study was positive with shunting observed  within 3-6 cardiac cycles suggestive of interatrial shunt. , possible bicuspid aortic valve - NG tube placed and feeds started  2/12- more awake, not following commands  2/14-  Concerns for rectal vaginal fistula- CT abd/pelvis with rectal contrast was negative- Intrahepatic and extrahepatic biliary ductal dilatation with suggestion of mild gallbladder wall thickening - [place] abd showed a CBD of 9.8 mm  2/15- NG removed for SLP eval- D 2 diet with thin liquids recommended but due to waxing and waning mental status, a cor trak was placed  2/21- ate 100 % of lunch yesterday and 50-75% of dinner per RN- remove cor trak CIR following to see if she is a candidate but she is not yet working enough with PT to be a candidate. I have consulted TOC to also look for a SNF bed for her.   Assessment and Plan: Osteomyelitis ([place])- (present on admission) - The patient underwent Right BKA on 09/17/2021.    Acute metabolic encephalopathy  - multifocal CVAs though to be secondary to embolism   - TTE bubble study + - 2/20 TEE shows a very small PFO and no thrombus - on ASA 325 mg   - differential diagnosis was endocarditis- blood cultures from 2/10 negative- dc Ceftriaxone on 2/15 - dc'd Tramadol 100 mg BID on 2/15 to see if mental status would improve  -  she is much more alert now and communicating -she keeps her eyes closed and on exam, she appears to be blind -  the RN in the room today told me she ate 100% of lunch yesterday  -- PT recommending CIR who has begun to evaluate her- they would like to see increased participation with therapies   - I have requested Neuro to make final recommendations now that she has had the TEE- the stroke  team will see her again today - f/u oral intake   Addendum: I spoke with dietician who states> RN's are not documenting diet (I agree with this) and the patient is not eating enough per dietician - they would like to keep the tube feeds going at night (55 cc/hr) via the cor trak, will start calorie counts, assistance with each feed and automatic food trays- per RN today, she pocketed lunch and ate only about 10%, does not drink liquids and wont drink the  dietary supplements ordered   Hypoglycemia -  type 2 diabetes mellitus ([place]), uncontrolled-  - Hemoglobin A1C    Component Value Date/Time   HGBA1C 10.4 (H) 08/11/2021 0415     -having AM episodes of mild hypoglycemia after tube feeds stopped    - She was on 16 U Semglee BID which was reduced to 16 U yesterday - as tube feeds being resumed, will cont Semglee at 16 U QHS - f/u on sugars please    PFO (patent foramen ovale) The patient is found to have a PFO on TTE and TEE     ESRD on hemodialysis ([place]) - on HD TTS    Hypotension - resolved - dc'd Midodrine TID  Moderate Malnutrition mild fat depletion, moderate muscle depletion - have started Juven and Nepro shakes in between meals        Subjective: she has no complaints  Physical Exam: Vitals:   10/03/21 1000 10/03/21 1030 10/03/21 1057 10/03/21 1149  BP: 90/75 (!) 134/48 (!) 110/54 (!) 140/57  Pulse: 100 91 88 92  Resp:   14 16  Temp:   97.9 F (36.6 C)   TempSrc:   Oral   SpO2:      Weight:   56.8 kg   Height:       General exam: Appears comfortable  HEENT:  no sclera icterus or thrush Respiratory system: Clear to auscultation. Respiratory effort normal. Cardiovascular system: S1 & S2 heard, regular rate and rhythm Gastrointestinal system: Abdomen soft, non-tender, nondistended. Normal bowel sounds   Central nervous system: keeps eyes closes, answers questions and follows commands Extremities: No cyanosis, clubbing or edema Skin: No rashes or ulcers Psychiatry:  Mood & affect appropriate.    Data Reviewed:    Family Communication: with sister [name]- today she is not answering the phone  Disposition: Status is: Inpatient  Remains inpatient appropriate because:  oral intake is poor- needs to work with PT more before CIR will take her.    Planned Discharge Destination: CIR     Time spent: 35 minutes  Author: Debbe Odea, MD 10/03/2021 4:34 PM  For on call review [email or web address].

## 2021-10-03 NOTE — Progress Notes (Signed)
Notified attending provider about patient hypoglycemic episode this morning VIA amion through paged.

## 2021-10-03 NOTE — Progress Notes (Signed)
SLP Cancellation Note  Patient Details Name: Onetha Gaffey MRN: [id number] DOB: 11/07/1980   Cancelled treatment:       Reason Eval/Treat Not Completed: Patient at procedure or test/unavailable    Osie Bond., M.A. [place] [facility] Pager [phone] Office [phone]  10/03/2021, 9:00 AM

## 2021-10-03 NOTE — Progress Notes (Signed)
Occupational Therapy Treatment Patient Details Name: [name] MRN: [id number] DOB: 13-Feb-1981 Today's Date: 10/03/2021   History of present illness [name] is a 41 y.o. female who was sent after completing dialysis session 09/15/21 due to staff concern for altered mental status.  She was not speaking to them. 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   OT comments  Pt completed bed mobility with max x2 to sit at EOB and pt was able to complete sitting with max to CGA while unsupported. Pt then completed a lateral scooting from bed to chair with use of pads with max x2. Pt once in chair was able to lean side to side to be able to position pillows with min to max assist and able to pull forward using BUE on armrests. Pt currently with functional limitations due to the deficits listed below (see OT Problem List).  Pt will benefit from skilled OT to increase their safety and independence with ADL and functional mobility for ADL to facilitate discharge to venue listed below.     Recommendations for follow up therapy are one component of a multi-disciplinary discharge planning process, led by the attending physician.  Recommendations may be updated based on patient status, additional functional criteria and insurance authorization.    Follow Up Recommendations  Acute inpatient rehab (3hours/day)    Assistance Recommended at Discharge Frequent or constant Supervision/Assistance  Patient can return home with the following  A lot of help with walking and/or transfers;A lot of  help with bathing/dressing/bathroom;Assistance with cooking/housework;Assistance with feeding;Direct supervision/assist for medications management;Direct supervision/assist for financial management;Assist for transportation   Equipment Recommendations       Recommendations for Other Services      Precautions / Restrictions Precautions Precautions: Fall Precaution Comments: limb protector RLE Restrictions Weight Bearing Restrictions: Yes RUE Weight Bearing: Weight bearing as tolerated RLE Weight Bearing: Non weight bearing       Mobility Bed Mobility Overal bed mobility: Needs Assistance Bed Mobility: Supine to Sit Rolling: Max assist, +2 for physical assistance, +2 for safety/equipment Sidelying to sit: HOB elevated, Max assist, +2 for physical assistance, +2 for safety/equipment Supine to sit: Max assist, +2 for physical assistance, +2 for safety/equipment, HOB elevated          Transfers Overall transfer level: Needs assistance     Sit to Stand: Total assist, From elevated surface (unable to clear surface) Lateral scooting transfers: Max assist, +2 physical assistance, +2 safety/equipment         General transfer comment: Pt able to reach out to therapist to assist in positioning with lateral scooting to the L side     Balance Overall balance assessment: Needs assistance Sitting-balance support: Feet supported, Bilateral upper extremity supported Sitting balance-Leahy Scale: Poor Sitting balance - Comments: max to CGA when unsupported sitting   Standing balance support:  (unable to come into a standing position)                               ADL either performed or assessed with clinical judgement   ADL  Overall ADL's : Needs assistance/impaired Eating/Feeding: Total assistance;Bed level   Grooming: Wash/dry hands;Wash/dry face;Maximal assistance;Bed level   Upper Body Bathing: Maximal assistance;Bed level   Lower Body Bathing: Total  assistance;+2 for physical assistance;+2 for safety/equipment;Bed level   Upper Body Dressing : Maximal assistance;Bed level   Lower Body Dressing: Total assistance;+2 for physical assistance;+2 for safety/equipment;Bed level                 General ADL Comments: Pt completed sitting at EOB but requiring max assist intialy then with increase in positioning of BUE and using R base of bed able to increase posture with CGA    Extremity/Trunk Assessment Upper Extremity Assessment Upper Extremity Assessment: RUE deficits/detail;LUE deficits/detail RUE Deficits / Details: Pt noted decrease in control of BUE with coordination/use. Pt does better when able to grasp onto larger items such as bed rail when sitting at EOB LUE Deficits / Details: Pt noted decrease in control of BUE with coordination/use. Pt does better when able to grasp onto larger items such as bed rail when sitting at EOB   Lower Extremity Assessment Lower Extremity Assessment: Defer to PT evaluation RLE Deficits / Details: Residual limb wrapped and limb protector donned        Vision   Additional Comments: blind in R eye and does not like having eyes open   Perception Perception Perception: Impaired   Praxis Praxis Praxis: Impaired Praxis Impairment Details: Initiation;Motor planning    Cognition Arousal/Alertness: Awake/alert Behavior During Therapy: Flat affect, Impulsive Overall Cognitive Status: Impaired/Different from baseline Area of Impairment: Orientation, Attention, Following commands, Safety/judgement, Awareness, Problem solving, Memory                 Orientation Level: Disoriented to, Time, Situation Current Attention Level: Focused Memory: Decreased recall of precautions, Decreased short-term memory Following Commands: Follows one step commands inconsistently Safety/Judgement: Decreased awareness of safety, Decreased awareness of deficits Awareness: Intellectual Problem Solving: Slow  processing, Decreased initiation, Difficulty sequencing, Requires verbal cues, Requires tactile cues          Exercises      Shoulder Instructions       General Comments      Pertinent Vitals/ Pain       Pain Assessment Pain Assessment: Faces Faces Pain Scale: Hurts little more Pain Location: bottom due to wounds with movement Pain Descriptors / Indicators: Discomfort, Grimacing, Moaning  Home Living                                          Prior Functioning/Environment              Frequency  Min 2X/week        Progress Toward Goals  OT Goals(current goals can now be found in the care plan section)  Progress towards OT goals: Progressing toward goals  Acute Rehab OT Goals Patient Stated Goal: none reported OT Goal Formulation: Patient unable to participate in goal setting Time For Goal Achievement: 10/17/21 Potential to Achieve Goals: Fair ADL Goals Pt Will Perform Grooming: with set-up;with supervision;sitting Pt Will Perform Upper Body Bathing: with set-up;with supervision;sitting Pt Will Perform Lower Body Bathing: with min assist;sit to/from stand Pt Will Perform Upper Body Dressing: with supervision;with set-up;sitting Pt Will Perform Lower Body Dressing: with mod assist;sitting/lateral leans Pt Will Transfer to Toilet: with mod assist;stand pivot transfer;bedside commode Pt Will Perform Toileting - Clothing Manipulation  and hygiene: sitting/lateral leans;with caregiver independent in assisting;with modified independence Additional ADL Goal #1: Pt will be S to come up to sit EOB Additional ADL Goal #2: Pt will be alert and oriented x4 without cues  Plan Discharge plan needs to be updated    Co-evaluation    PT/OT/SLP Co-Evaluation/Treatment: Yes Reason for Co-Treatment: Complexity of the patient's impairments (multi-system involvement)   OT goals addressed during session: ADL's and self-care      AM-PAC OT "6 Clicks" Daily  Activity     Outcome Measure   Help from another person eating meals?: Total Help from another person taking care of personal grooming?: Total Help from another person toileting, which includes using toliet, bedpan, or urinal?: Total Help from another person bathing (including washing, rinsing, drying)?: Total Help from another person to put on and taking off regular upper body clothing?: Total Help from another person to put on and taking off regular lower body clothing?: Total 6 Click Score: 6    End of Session Equipment Utilized During Treatment: Gait belt  OT Visit Diagnosis: Other abnormalities of gait and mobility (R26.89);Unsteadiness on feet (R26.81);Muscle weakness (generalized) (M62.81);Other symptoms and signs involving cognitive function;Low vision, both eyes (H54.2) Pain - Right/Left: Right Pain - part of body: Ankle and joints of foot   Activity Tolerance Patient limited by lethargy   Patient Left in chair;with call bell/phone within reach   Nurse Communication Need for lift equipment;Mobility status        Time: 1610-9604 OT Time Calculation (min): 30 min  Charges: OT General Charges $OT Visit: 1 Visit OT Treatments $Self Care/Home Management : 8-22 mins  [name] OTR/L  Acute Rehab Services  [phone] office number [phone] pager number   [name] 10/03/2021, 1:57 PM

## 2021-10-03 NOTE — Progress Notes (Signed)
Nutrition Follow-up  DOCUMENTATION CODES:   Non-severe (moderate) malnutrition in context of acute illness/injury  INTERVENTION:  Re-initiate nocturnal tube feeds:  - Nepro @ 65 ml/hr x 12 hours from 1800 to 0600 (total of 780 ml)   Nocturnal tube feeding regimen will provide 1404 kcal, 63 grams of protein, and 567 ml of H2O (meets 78% of kcal needs and 70% of protein needs).   - Discontinue Nepro Shake po BID between meals as pt refusing   - Continue renavit daily  - Continue -1 packet Juven BID per tube, each packet provides 95 calories, 2.5 grams of protein (collagen), and 9.8 grams of carbohydrate (3 grams sugar); also contains 7 grams of L-arginine and L-glutamine, 300 mg vitamin C, 15 mg vitamin E, 1.2 mcg vitamin B-12, 9.5 mg zinc, 200 mg calcium, and 1.5 g  Calcium Beta-hydroxy-Beta-methylbutyrate to support wound healing  - Feeding assistance with all meals and snacks   - Transition pt to inappropriate for room service   - Initiate 48-hour calorie count    - Encourage PO intake  - If pt unable to improve PO intake, recommend consideration of PEG for long-term nutrition support   NUTRITION DIAGNOSIS:   Moderate Malnutrition related to acute illness (osteomyelitis of R foot) as evidenced by mild fat depletion, moderate muscle depletion.  Ongoing   GOAL:   Patient will meet greater than or equal to 90% of their needs  Progressing  MONITOR:   PO intake, Supplement acceptance, Labs, Weight trends, TF tolerance, Skin, I & O's  REASON FOR ASSESSMENT:   Consult Assessment of nutrition requirement/status, Enteral/tube feeding initiation and management  ASSESSMENT:   41 y.o. 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 In ED, imaging suggested osteomyelitis of the right foot.  02/05 - s/p R BKA 02/11 - NG tube placed, tube feeds  initiated 02/13 - CT abdomen/pelvis negative for rectovaginal fistula 02/15 - NG tube removed, diet advanced to dysphagia 2 with thin liquids, due to waning mental status, a Cortrak was placed for nocturnal tube feeds  2/20 - echocardiogram transesophageal procedure  2/21 - Re-initiate nocturnal tube feeds via Cortrak  Per chart review, last documented meal completion on 10/01/21 with 10% of breakfast eaten. Prior to 2/19, last documented meal completion on 09/22/21 (primarily 0%).   Met with pt at bedside. Pt's eyes closed during visit and did not arouse to dietetic intern voice, but she did to touch. Dietetic intern asked pt if she was nausea and pt nods her head that she is nauseous. Dietetic intern asked pt if she has been drinking her nutritional supplements and pt nods head side-to-side and states no. Pt has Cortrak placement but has not been receiving nocturnal tube feeds. Due to poor PO intake, dietetic intern recommend to reinitiate nocturnal TF to aid in pt meeting nutritional needs.  Dietetic intern spoke to RN outside room prior to entering and per RN, pt has not been receiving TF and states that they did not want to start her on tube feeds. Dietetic intern and RD spoke to RN after visiting with pt and per nurse, pt ate a good amount of her lunch and dinner meals on 10/02/21. When discussing amount of meals consumed, RN specified that pt consumed nearly all of protein options on tray. RN states that pt does poorly with oral nutrition supplements/fluids, but does well with food.  Discussed the importance of adequate meal documentation with RN.  Dietetic intern spoke to another RN who was assisting pt with feeding during lunch and reported that pt barely ate any of her food (consumed 3-4 bits) and is unsure of pt's meal completion history. Plan to reach out to MD to discuss nutrition plan of care. Dietetic intern and RD reached out to MD and recommended Cortrak stay placed and pt to reinitiate  nocturnal tube feeding due to poor oral intake, waning mental status, and pt being malnourished.   Last HD: today with 1850 mL UF Admit wt: 55 kg Pre-HD wt: 58.6 kg  Post-HD wt: 56.8 kg EDW: 59.5 kg  Pt will need new lowered EDW at discharge as pt is currently below EDW  Medications reviewed and include: calcitriol, sensipar, aranesp weekly, , Nepro Shake BID, SSI q 4 hours, renavit, protonix, renvela TID, Juven BID  Labs reviewed:  Sodium: 128 calcium: 9.0  Hemoglobin: 8.8 CBGs: 65-106 x 24 hours    Diet Order:   Diet Order             DIET DYS 2 Room service appropriate? Yes; Fluid consistency: Thin  Diet effective now                   EDUCATION NEEDS:   Not appropriate for education at this time  Skin:  Skin Assessment: Skin Integrity Issues: Skin Integrity Issues:: Other (Comment), Stage II Stage II: sacrum Incisions: R BKA Other: MASD bilateral buttocks  Last BM:  2/19  Height:   Ht Readings from Last 1 Encounters:  10/02/21 '5\' 1"'  (1.549 m)    Weight:   Wt Readings from Last 1 Encounters:  10/03/21 58.6 kg    Ideal Body Weight:  44.9 kg (adjusted by 5.9% for BKA)  BMI:  Body mass index is 24.41 kg/m.  Estimated Nutritional Needs:   Kcal:  1800-2000 kcal/d  Protein:  90-100 g/d  Fluid:  1L+UOP    Maryruth Hancock, Dietetic Intern 10/03/2021 9:48 AM

## 2021-10-03 NOTE — Progress Notes (Signed)
Physical Therapy Treatment Patient Details Name: [name] MRN: [id number] DOB: November 09, 1980 Today's Date: 10/03/2021   History of Present Illness [name] is a 41 y.o. female who was sent after completing dialysis session 09/15/21 due to staff concern for altered mental status.  She was not speaking to them. 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    PT Comments    Patient continues to present as lethargic and maintains eyes closed throughout mobility. Max Assist required to move supine to EOB and pt with anterior lean sitting EOB requiring cues to raise chest toward ceiling and CGA to Max assist to stabilize balance. Pt attempted stand with RW and +2 Total Assist but unable to rise. Max/Total +2 assist with use of bed pad for lateral scoots to move to recliner completed. Pt demonstrated improved ability to follow cues for lateral trunk lean to facilitate head/hip relationship for bed>chair transfer and to lean in recliner to reposition pillows for cushioning. EOS pt sitting upright and more alert than at start. Continue to recommend intense rehab at AIR setting to progress pt's independence with mobility.      Recommendations for follow up therapy are one component of a multi-disciplinary discharge planning process, led by the attending physician.  Recommendations may be updated based on patient status, additional functional criteria and insurance authorization.  Follow Up Recommendations  Acute inpatient rehab (3hours/day)     Assistance Recommended at Discharge  Frequent or constant Supervision/Assistance  Patient can return home with the following Two people to help with walking and/or transfers;Two people to help with bathing/dressing/bathroom;A lot of help with bathing/dressing/bathroom;A lot of help with walking and/or transfers;Assistance with cooking/housework;Assistance with feeding;Direct supervision/assist for medications management;Direct supervision/assist for financial management;Assist for transportation;Help with stairs or ramp for entrance   Equipment Recommendations  BSC/3in1;Wheelchair (measurements PT);Wheelchair cushion (measurements PT);Hospital bed;Other (comment)    Recommendations for Other Services Rehab consult     Precautions / Restrictions Precautions Precautions: Fall Precaution Comments: limb protector RLE Restrictions Weight Bearing Restrictions: Yes RUE Weight Bearing: Weight bearing as tolerated RLE Weight Bearing: Non weight bearing     Mobility  Bed Mobility Overal bed mobility: Needs Assistance Bed Mobility: Supine to Sit Rolling: Max assist, +2 for physical assistance, +2 for safety/equipment Sidelying to sit: HOB elevated, Max assist, +2 for physical assistance, +2 for safety/equipment            Transfers Overall transfer level: Needs assistance     Sit to Stand: Total assist, From elevated surface, +2 safety/equipment, +2 physical assistance (unable to clear surface) Stand pivot transfers: Max assist, +2 physical assistance, +2 safety/equipment, Total assist         General transfer comment: Pt reaching out for RW when placed infront of her. Attempted to power up from elevated EOB with RW and +2 Max/Total assist. pt unable to rise with minimal initiation through bil UE and Lt LE. Pt able to lean trunk anterior and use head hip relationship to lean Rt to move Lt through lateral scoots. +2 Max assist with bed pad to complete 3x lateral scoot to move bed>recliner. Lift pad under pt  for RN to  transfer back after ~45 minutes OOB.    Ambulation/Gait                   Stairs             Wheelchair Mobility    Modified Rankin (Stroke Patients Only)       Balance Overall balance assessment: Needs assistance Sitting-balance support: Feet supported, Bilateral upper extremity supported Sitting balance-Leahy Scale: Poor Sitting balance - Comments: max to CGA when unsupported sitting   Standing balance support:  (unable to come into a standing position)                                Cognition Arousal/Alertness: Awake/alert Behavior During Therapy: Flat affect, Impulsive Overall Cognitive Status: Impaired/Different from baseline Area of Impairment: Orientation, Attention, Following commands, Safety/judgement, Awareness, Problem solving, Memory                 Orientation Level: Disoriented to, Time, Situation Current Attention Level: Focused Memory: Decreased recall of precautions, Decreased short-term memory Following Commands: Follows one step commands inconsistently, Follows one step commands with increased time Safety/Judgement: Decreased awareness of safety, Decreased awareness of deficits Awareness: Intellectual Problem Solving: Slow processing, Decreased initiation, Difficulty sequencing, Requires verbal cues, Requires tactile cues General Comments: Pt became more alert once sitting up in recliner and making needs known more clearly. pt reporting soreness and need for pillows under bottom sitting. When therapist cued to weight shift lateral pt followed commands more immediately and accurately.        Exercises      General Comments        Pertinent Vitals/Pain Pain Assessment Pain Assessment: Faces Faces Pain Scale: Hurts even more Pain Location: bottom due to wounds with movement Pain Descriptors / Indicators: Discomfort, Grimacing, Moaning Pain Intervention(s): Limited activity within patient's tolerance, Monitored  during session, Repositioned    Home Living                          Prior Function            PT Goals (current goals can now be found in the care plan section) Acute Rehab PT Goals PT Goal Formulation: With patient Time For Goal Achievement: 10/14/21 Potential to Achieve Goals: Fair Progress towards PT goals: Progressing toward goals    Frequency    Min 4X/week      PT Plan Current plan remains appropriate    Co-evaluation PT/OT/SLP Co-Evaluation/Treatment: Yes Reason for Co-Treatment: Complexity of the patient's impairments (multi-system involvement);To address functional/ADL transfers;For patient/therapist safety PT goals addressed during session: Mobility/safety with mobility;Balance OT goals addressed during session: ADL's and self-care      AM-PAC PT "6 Clicks" Mobility   Outcome Measure  Help needed turning from your back to your side while in a flat bed without using bedrails?: A Lot Help needed moving from lying on your back to sitting on the side of a flat bed without using bedrails?: A Lot Help needed moving to and from a bed to a chair (including a wheelchair)?: Total Help needed standing up from a chair using your arms (e.g., wheelchair or bedside chair)?: Total Help needed to walk in hospital room?: Total Help needed climbing 3-5 steps with a railing? : Total 6 Click Score: 8    End of Session Equipment Utilized During Treatment: Gait belt Activity Tolerance: Patient  tolerated treatment well;Patient limited by fatigue Patient left: in chair;with call bell/phone within reach Nurse Communication: Mobility status PT Visit Diagnosis: Other abnormalities of gait and mobility (R26.89);Muscle weakness (generalized) (M62.81);Difficulty in walking, not elsewhere classified (R26.2);Other symptoms and signs involving the nervous system (R29.898) Pain - Right/Left: Right Pain - part of body: Leg     Time: 7409-9278 PT Time Calculation (min) (ACUTE  ONLY): 29 min  Charges:  $Therapeutic Activity: 8-22 mins                     Verner Mould, DPT Acute Rehabilitation Services Office [phone] Pager [phone]    Jacques Navy 10/03/2021, 4:22 PM

## 2021-10-03 NOTE — Progress Notes (Signed)
Patient CBG this AM was 57, nurse followed protocol and gave 12.16ml of D50 through IV , rechecked it again at 0705 and it came up to 106, patient is alert, left for HD, day shift nurse updated, will continue to monitor.

## 2021-10-04 DIAGNOSIS — H02403 Unspecified ptosis of bilateral eyelids: Secondary | ICD-10-CM

## 2021-10-04 DIAGNOSIS — I6340 Cerebral infarction due to embolism of unspecified cerebral artery: Secondary | ICD-10-CM | POA: Diagnosis not present

## 2021-10-04 DIAGNOSIS — G9340 Encephalopathy, unspecified: Secondary | ICD-10-CM | POA: Diagnosis not present

## 2021-10-04 DIAGNOSIS — R1312 Dysphagia, oropharyngeal phase: Secondary | ICD-10-CM | POA: Diagnosis not present

## 2021-10-04 DIAGNOSIS — M869 Osteomyelitis, unspecified: Secondary | ICD-10-CM | POA: Diagnosis not present

## 2021-10-04 LAB — GLUCOSE, CAPILLARY
Glucose-Capillary: 195 mg/dL — ABNORMAL HIGH (ref 70–99)
Glucose-Capillary: 236 mg/dL — ABNORMAL HIGH (ref 70–99)
Glucose-Capillary: 238 mg/dL — ABNORMAL HIGH (ref 70–99)
Glucose-Capillary: 252 mg/dL — ABNORMAL HIGH (ref 70–99)
Glucose-Capillary: 270 mg/dL — ABNORMAL HIGH (ref 70–99)

## 2021-10-04 LAB — MAGNESIUM
Magnesium: 2.4 mg/dL (ref 1.7–2.4)
Magnesium: 2.3 mg/dL (ref 1.7–2.4)

## 2021-10-04 LAB — PHOSPHORUS
Phosphorus: 4.6 mg/dL (ref 2.5–4.6)
Phosphorus: 4.9 mg/dL — ABNORMAL HIGH (ref 2.5–4.6)

## 2021-10-04 NOTE — Progress Notes (Addendum)
°  Progress Note    10/04/2021 8:35 AM 2 Days Post-Op  Subjective:  sleepy but answers questions. Reports no pain. Asks to be turned over   Vitals:   10/04/21 0600 10/04/21 0744  BP: 109/73 (!) 119/50  Pulse: 98 98  Resp: 18   Temp:  (!) 97.4 F (36.3 C)  SpO2:  100%   Physical Exam: Cardiac:  regular Lungs:  non labored Incisions:  right BKA stable overall. Still with darkening of stump but overall appears better   Neurologic: alert  CBC    Component Value Date/Time   WBC 11.8 (H) 10/03/2021 0804   RBC 3.03 (L) 10/03/2021 0804   HGB 8.8 (L) 10/03/2021 0804   HGB 10.6 (L) 05/18/2020 0927   HCT 29.1 (L) 10/03/2021 0804   HCT 35.1 05/18/2020 0927   PLT 290 10/03/2021 0804   PLT 186 05/18/2020 0927   MCV 96.0 10/03/2021 0804   MCV 89 05/18/2020 0927   MCH 29.0 10/03/2021 0804   MCHC 30.2 10/03/2021 0804   RDW 26.0 (H) 10/03/2021 0804   RDW 13.9 05/18/2020 0927   LYMPHSABS 1.9 09/27/2021 0129   LYMPHSABS 1.6 05/18/2020 0927   MONOABS 1.3 (H) 09/27/2021 0129   EOSABS 0.4 09/27/2021 0129   EOSABS 0.6 (H) 05/18/2020 0927   BASOSABS 0.1 09/27/2021 0129   BASOSABS 0.0 05/18/2020 0927    BMET    Component Value Date/Time   NA 128 (L) 10/03/2021 0804   NA 133 (L) 05/18/2020 0927   K 5.0 10/03/2021 0804   CL 90 (L) 10/03/2021 0804   CO2 24 10/03/2021 0804   GLUCOSE 100 (H) 10/03/2021 0804   BUN 65 (H) 10/03/2021 0804   BUN 45 (H) 05/18/2020 0927   CREATININE 7.90 (H) 10/03/2021 0804   CALCIUM 9.0 10/03/2021 0804   GFRNONAA 6 (L) 10/03/2021 0804   GFRAA 6 (L) 05/18/2020 0927    INR    Component Value Date/Time   INR 1.2 08/10/2021 2212     Intake/Output Summary (Last 24 hours) at 10/04/2021 0835 Last data filed at 10/03/2021 1317 Gross per 24 hour  Intake 25 ml  Output 1850 ml  Net -1825 ml     Assessment/Plan:  41 y.o. female is s/p Right BKA 17 Days Post Op   -pt's right BKA stump looks stable. It is warm to touch. No drainage. Dark areas  still present but better appearing - Can continue with stump stocking - Will continue to follow   [name], PA-C [facility] [phone] 10/04/2021 8:35 AM

## 2021-10-04 NOTE — Plan of Care (Signed)
°  Problem: Safety: Goal: Ability to remain free from injury will improve Outcome: Progressing   Problem: Education: Goal: Knowledge of General Education information will improve Description: Including pain rating scale, medication(s)/side effects and non-pharmacologic comfort measures Outcome: Not Progressing   Problem: Health Behavior/Discharge Planning: Goal: Ability to manage health-related needs will improve Outcome: Not Progressing

## 2021-10-04 NOTE — Progress Notes (Signed)
Calorie Count Note  48 hour calorie count ordered.   Diet: Dysphagia 2, thin liquids   Day 1 results: 10/03/21 Breakfast: no available documentation  Lunch: 48 kcal, 3 grams of protein Dinner: 569 kcal, 11 grams of protein  Total intake: 617 kcal (34% of minimum estimated needs)  14 protein (15% of minimum estimated needs)  Estimated Nutritional Needs:    Kcal:  1800-2000 kcal/d   Protein:  90-100 g/d   Fluid:  1L+UOP  Nutrition Dx:   Moderate Malnutrition related to acute illness (osteomyelitis of R foot) as evidenced by mild fat depletion, moderate muscle depletion.   Ongoing   Goal:   Patient will meet greater than or equal to 90% of their needs   Progressing  Intervention:  Continue nocturnal tube feeds:  - Nepro @ 65 ml/hr x 12 hours from 1800 to 0600 (total of 780 ml)   Nocturnal tube feeding regimen will provide 1404 kcal, 63 grams of protein, and 567 ml of H2O (meets 78% of kcal needs and 70% of protein needs).   - Continue renavit daily   - Continue -1 packet Juven BID per tube, each packet provides 95 calories, 2.5 grams of protein (collagen), and 9.8 grams of carbohydrate (3 grams sugar); also contains 7 grams of L-arginine and L-glutamine, 300 mg vitamin C, 15 mg vitamin E, 1.2 mcg vitamin B-12, 9.5 mg zinc, 200 mg calcium, and 1.5 g  Calcium Beta-hydroxy-Beta-methylbutyrate to support wound healing   - Continue to provide feeding assistance with all meals and snacks    - Continue day two calorie count    - If pt unable to improve PO intake, recommend consideration of PEG for long-term nutrition support    Maryruth Hancock, Dietetic Intern 10/04/2021 4:16 PM

## 2021-10-04 NOTE — Progress Notes (Addendum)
STROKE TEAM PROGRESS NOTE   INTERVAL HISTORY  Patient seen at the bedside today. She is laying with her eyes closed, appropriately alert and responsive to assessment. She reports that she is unable to open her eyes without using her hands and is otherwise able to follow commands appropriately. She reports willingness to work with therapies and get stronger in order to get home.   Vitals:   10/03/21 2345 10/04/21 0300 10/04/21 0600 10/04/21 0744  BP: 113/62 (!) 71/36 109/73 (!) 119/50  Pulse: 100 (!) 109 98 98  Resp: 18 18 18    Temp:  98 F (36.7 C)  (!) 97.4 F (36.3 C)  TempSrc:  Axillary  Oral  SpO2: 100% 99%  100%  Weight:      Height:       CBC:  Recent Labs  Lab 09/30/21 0837 10/03/21 0804  WBC 8.7 11.8*  HGB 8.2* 8.8*  HCT 28.4* 29.1*  MCV 97.3 96.0  PLT 243 301    Basic Metabolic Panel:  Recent Labs  Lab 09/30/21 0838 10/03/21 0804 10/03/21 2125 10/04/21 0503  NA 133* 128*  --   --   K 3.7 5.0  --   --   CL 93* 90*  --   --   CO2 25 24  --   --   GLUCOSE 235* 100*  --   --   BUN 46* 65*  --   --   CREATININE 6.88* 7.90*  --   --   CALCIUM 8.3* 9.0  --   --   MG  --   --  2.3 2.4  PHOS 4.8*  --  5.8* 4.6    Lipid Panel: No results for input(s): CHOL, TRIG, HDL, CHOLHDL, VLDL, LDLCALC in the last 168 hours. HgbA1c: No results for input(s): HGBA1C in the last 168 hours. Urine Drug Screen: No results for input(s): LABOPIA, COCAINSCRNUR, LABBENZ, AMPHETMU, THCU, LABBARB in the last 168 hours.  Alcohol Level No results for input(s): ETH in the last 168 hours.  IMAGING past 24 hours No results found.  PHYSICAL EXAM GENERAL: 9 African-American lady laying comfortably in hospital bed, in no acute distress LUNGS: Normal respiratory effort. Non-labored breathing on room air CV: Regular rate and rhythm on telemetry Musculoskeletal : Right below-knee amputation NEURO:  Mental Status: alert, but does not open eyes to stimuli throughout, will follow  commands with upper extremities and RLE; attempts with LLE. She did answer orientation questions for this examiner, A&O to person, time, and place. Cranial Nerves:  II: Right pupil is nonreactive to direct light, is 3 mm and irregular. The left pupil is 5 mm, irregular, and sluggishly reactive to light.  III, IV, VI: Does not open eyes spontaneously, attempts to track examiner with minimal movement of L eye; no movement of R eye.  Eyes are dysconjugate with mild right eye exotropia V: Does not blink to threat throughout   VII: Face appears symmetric resting and with movement.  VIII: Hearing intact as patient participates in conversation appropriately. IX, X: Does not allow for visualization of patient's soft palate XI: Head is grossly midline XII: Protrudes tongue midline Motor:  4/5 strength of bilateral upper extremities. Moves RLE to crossover L, and attempts L over R She attempts to bend LLE, with some movement. Right BKA. Tone and bulk are normal.  Sensation: grossly intact Gait: Deferred  ASSESSMENT/PLAN Ms. Glorene Leitzke is a 41 y.o. female with history of ESRD on HD T/Th/Sat, type 2 diabetes mellitus with  neuropathy, hypertension, osteomyelitis with BKA, right eye blindness, and medical non-adherence presenting with  lethargy and AMS following HD on 2/3.  Initial work up in the ED revealed patient with leukocytosis concerning for infection and imaging with concern for osteomyelitis for which vascular surgery recommended admission for BKA on 2/5 and ongoing antibiotics. Of note, patient was discharged in January following urgent transmetatarsal amputation after being found with gangrenous toes s/p wound vac and IV Ancef after wound cultures grew MSSA. During hospitalization, patient's mental status initially improved and she was responding appropriately on evaluation on 2/4 with surgery for BKA on 2/5. On 2/6, she was noted to be somnolent and slow to respond but she was answering questions  appropriately. On 2/7 patient had an episode of hypotension with HD but was noted to still be lethargic but followed commands and answered questions appropriately. Patient again had HD on 2/9 and became more lethargic, patient lethargic on exam during HD on 2/11. TEE completed- no thrombus detected, small PFO noted. Start atorvastatin, ASA 81mg  and plavix 75mg .  Stroke:  scattered punctate acute infarcts throughout both cerebral  hemispheres more on the left CR, embolic shower, etiology not quite clear CT head No acute abnormality. CTA head & neck - No large vessel occlusion. Intracranial atherosclerosis including moderate right and mild left ICA stenoses.  MRI - Scattered punctate acute infarcts throughout both cerebral hemispheres which may be embolic. More confluent region of potentially subacute infarction in the left corona radiata. Chronic ischemia with multiple chronic infarcts as above. 2D Echo EF 70-75%, hyperdynamic LV, evidence of atrial level shunt detected by color-flow Doppler (the bubble study positive) TEE - LA, LAA without masses Very small PFO as tested with injection of agitated saline with few bubbles seen in LA.   Not clearly visible with color doppler.  Consider 30 day cardiac event monitoring as outpt.  EEG- Mild generalized slowing. Focal slowing is seen intermittently in the right centrotemporal region. No electrographic seizures were recorded.  LE venous Doppler no DVT LDL 97 HgbA1c 10.4 VTE prophylaxis - heparin subq No antithrombotic prior to admission, now on ASA 81mg  and Plavix 75mg  for 3 weeks and then ASA alone. Therapy recommendations: CIR  Disposition: Pending  Bilateral ptosis Patient has not been able to open eyes for quite some time even follows all simple commands and answer question appropriately. Exact time of occurrence not quite sure.  DDx including eye opening apraxia (but not able to maintain eye opening after forced eye opening), polyneuropathy  including CN III IV VI (but pupil sparing), miller-fisher variant of GBS, or critical illness neuropathy/myopathy Continue supportive care Will have Dr. Cheral Marker take a look in am  Hypertension Home meds:  Losartan Stable Avoid low BP Long-term BP goal normotensive Midodrine if needed  Hyperlipidemia Home meds: None LDL 97, goal < 70 LFT normalized Atorvastatin 40mg  added Continue statin on discharge  Diabetes type II Uncontrolled Home meds:  INsulin HgbA1c 8.8 goal < 7.0 CBGs SSI Close PCP follow-up for better DM control  Other Stroke Risk Factors Hx stroke/TIA Coronary artery disease  Other Active Problems Osteomyelitis BKA 2/5  End stage renal disease HD per nephrology  Cr 7.90 Leukocytosis - management by primary team WBC 11.8 Vancomycin, merrem Malnutrition Dietary/nutrition consult   Rosezetta Schlatter, MD PGY-1 Stroke Neurology 10/04/2021 8:31 AM  ATTENDING NOTE: I reviewed above note and agree with the assessment and plan. Pt was seen and examined.   Pt lying in bed, more awake alert today, answer questions  appropriately, however, severe dysarthria and still not able to open eyes, not able to maintain eye opening after passive eye opening. Raised BUE against gravity, without drift. RLE BKA but able to lift against gravity, LLE not 3-/5 with knee flexion but not able to against gravity. DTR diminished throughout. She worked with PT/OT yesterday and speech today, on caloric count. RN reported eating well last night and this am.  Patient stroke consistent with embolic shower, however, etiology not quite clear.  No endocarditis, very small PFO likely insignificant.  No DVT.  Could be related to hypercoabulable state with uncontrolled diabetes and sepsis.  However, may consider 30-day CardioNet monitor as outpatient to rule out A-fib. Recommend to continue aspirin and Plavix DAPT for 3 weeks and then aspirin alone.  Continue Lipitor.  Aggressive risk factor  modification.  DC core track once p.o. adequate, now on caloric count.  Encourage patient working hard with PT/OT.   Patient persistent bilateral proptosis, etiology not quite clear either.  DDx including eye opening apraxia (but not able to maintain eye opening after forced eye opening), polyneuropathy including CN III IV VI (but pupil sparing), miller-fisher variant of GBS, or critical illness neuropathy/myopathy. Will have Dr. Cheral Marker to take a look in am.    For detailed assessment and plan, please refer to above as I have made changes wherever appropriate.   Rosalin Hawking, MD PhD Stroke Neurology 10/04/2021 1:04 PM    To contact Stroke Continuity provider, please refer to [email or web address] After hours, contact General Neurology

## 2021-10-04 NOTE — Progress Notes (Signed)
Progress Note   Patient: [name] PFX:[id number] DOB: 1980/12/01 DOA: 09/15/2021     19 DOS: the patient was seen and examined on 10/04/2021   Brief hospital course: 41 year old female with history of ESRD on dialysis Tuesdays Thursdays and Saturday, poorly controlled type 2 diabetes, hypertension she was initially sent in from dialysis session due to concern for altered mental status and lethargy.  She had a transmetatarsal amputation of the right foot 5 weeks prior to admission which grew MSSA and was receiving Ancef at dialysis.  X-ray of the right stump suggested osteomyelitis vascular surgery consulted recommended a below-knee amputation and she underwent BKA on 09/17/2021. From 09/18/2021 patient kept her eyes closed and was somnolent and was not able to or refusing to open her eyes.  On 09/22/2021 rapid response called for lethargy and minimally responsive to sternal rub PCCM and neurology were consulted.  MRI obtained shows scattered punctuate acute infarcts bilaterally and confluent region of the left corona radiata.  CT angiogram of the head and neck showed no large vessel occlusion.  EEG showed diffuse encephalopathy.  She was given pressors overnight and started antibiotics for possible endocarditis and was started on aspirin.  Echocardiogram shows positive for shunting and bicuspid aortic valve. She had a core track tube placed and feedings were started.    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  Patient followed by neurology. Transesophageal echo on 10/02/2021 showed small PFO with no thrombus.  On aspirin 325 mg daily.  Patient was started on Rocephin for possible endocarditis which has been stopped on 09/27/2021. CIR following.  #2 type 2 diabetes with hypoglycemia semiglee dose is being adjusted currently on 16 units nightly.  No further hypoglycemia. CBG (last 3)  Recent Labs    10/03/21 2342 10/04/21 0446 10/04/21 0838  GLUCAP 215* 252* 270*    #3  end-stage renal disease on hemodialysis Tuesday Thursday and Saturday  #4 hypotension resolved midodrine Dc'd  #5 moderate malnutrition due to acute illness as evidenced by fat and muscle depletion.  Continue Nepro shakes.  BMI 23.66   #6 osteomyelitis of the right foot status post right BKA followed by vascular  #7 b/l ptosis-eye opening apraxia vs multiple cranial nerves involvement or Critical illness myopathy?hopefully it will improve with time.   Subjective: Patient is resting in bed in no acute distress she has an IV in her left upper extremity She is laying with her eyes closed but she is responding appropriately and trying following commands.  Physical Exam: Vitals:   10/03/21 2345 10/04/21 0300 10/04/21 0600 10/04/21 0744  BP: 113/62 (!) 71/36 109/73 (!) 119/50  Pulse: 100 (!) 109 98 98  Resp: 18 18 18    Temp:  98 F (36.7 C)  (!) 97.4 F (36.3 C)  TempSrc:  Axillary  Oral  SpO2: 100% 99%  100%  Weight:      Height:       General exam: in nad   HEENT:  no sclera icterus or thrush Respiratory system: Clear to auscultation. Respiratory effort normal. Cardiovascular system: S1 & S2 heard, regular rate and rhythm Gastrointestinal system: Abdomen soft, non-tender, nondistended. Normal bowel sounds   Central nervous system: keeps eyes closes, answers questions and follows commands Extremities:right bka staples in place cdi Skin: No rashes or ulcers Psychiatry:  Mood & affect appropriate.   Data Reviewed:mag 2.4 phos 4.6   Family Communication: none   Disposition:cir vs snf  Status is: Inpatient   Planned Discharge  Destination: Skilled nursing facility  Dvt prophylaxis heparin  Time spent: 42 min minutes  Author: Georgette Shell, MD 10/04/2021 11:14 AM  For on call review [email or web address].

## 2021-10-04 NOTE — Progress Notes (Signed)
[facility] Progress Note   Subjective:  Seen in room - resting but wakes easily. Denies CP, dyspnea, leg pain this morning.  Objective Vitals:   10/03/21 2345 10/04/21 0300 10/04/21 0600 10/04/21 0744  BP: 113/62 (!) 71/36 109/73 (!) 119/50  Pulse: 100 (!) 109 98 98  Resp: 18 18 18    Temp:  98 F (36.7 C)  (!) 97.4 F (36.3 C)  TempSrc:  Axillary  Oral  SpO2: 100% 99%  100%  Weight:      Height:       Physical Exam General: Well appearing woman, NAD. NG tube in place. Heart: RRR; no murmur Lungs: CTA anteriorly Abdomen: soft, non-tender Extremities: R BKA, no LLE edema Dialysis Access: R AVF + thrill  Additional Objective Labs: Basic Metabolic Panel: Recent Labs  Lab 09/30/21 0838 10/03/21 0804 10/03/21 2125 10/04/21 0503  NA 133* 128*  --   --   K 3.7 5.0  --   --   CL 93* 90*  --   --   CO2 25 24  --   --   GLUCOSE 235* 100*  --   --   BUN 46* 65*  --   --   CREATININE 6.88* 7.90*  --   --   CALCIUM 8.3* 9.0  --   --   PHOS 4.8*  --  5.8* 4.6   Liver Function Tests: Recent Labs  Lab 09/30/21 0838 10/03/21 0804  AST  --  24  ALT  --  15  ALKPHOS  --  92  BILITOT  --  0.6  PROT  --  7.2  ALBUMIN 1.8* 2.0*   CBC: Recent Labs  Lab 09/30/21 0837 10/03/21 0804  WBC 8.7 11.8*  HGB 8.2* 8.8*  HCT 28.4* 29.1*  MCV 97.3 96.0  PLT 243 290   Studies/Results: DG Abd 1 View  Result Date: 10/02/2021 CLINICAL DATA:  Dysphagia, feeding tube placement. EXAM: ABDOMEN - 1 VIEW COMPARISON:  Plain film dated 09/27/2021. FINDINGS: Weighted tip feeding tube in place with tip directed towards the expected location of the stomach pylorus/proximal duodenum. Visualized bowel gas pattern is nonobstructive. No evidence of free intraperitoneal air. Lung bases appear clear. IMPRESSION: Weighted tip feeding tube in the distal stomach with tip directed towards the expected location of the stomach pylorus/proximal duodenum. Electronically Signed   By: Franki Cabot M.D.   On: 10/02/2021 13:40    Medications:   aspirin EC  81 mg Oral Daily   atorvastatin  40 mg Oral Daily   calcitRIOL  0.25 mcg Oral BID   Chlorhexidine Gluconate Cloth  6 each Topical Q0600   cinacalcet  90 mg Oral Q supper   clopidogrel  75 mg Oral Daily   darbepoetin (ARANESP) injection - DIALYSIS  200 mcg Intravenous Q Thu-HD   dorzolamide-timolol  1 drop Left Eye BID   feeding supplement (NEPRO CARB STEADY)  780 mL Per Tube Q24H   Gerhardt's butt cream   Topical TID   heparin injection (subcutaneous)  5,000 Units Subcutaneous Q8H   insulin aspart  0-6 Units Subcutaneous TID WC   insulin glargine-yfgn  16 Units Subcutaneous QHS   lidocaine-prilocaine  1 application Topical UD   mouth rinse  15 mL Mouth Rinse BID   multivitamin  1 tablet Oral QHS   nutrition supplement (JUVEN)  1 packet Per Tube BID BM   pantoprazole sodium  40 mg Oral Daily   sevelamer carbonate  1.6 g Oral TID  WC    Dialysis Orders: [place] [facility] 3h 47min   400/ 600   59.5kg   2/2 bath  Prof 2   RU AV fistula  Hep 4000  Mircera 200 mcg IV every 2 weeks - last 1/26 Ancef 2g 3 times weekly HD Covid neg 2/3, hep B SAg neg 2/4  Assessment/Plan: AMS - acute embolic CVA and/or watershed infarcts. With PFO on TTE. TEE showed small PFO and no clot. Blood Cx 2/4 negative. S/p course of IV abx.  Hx R BKA (2/5 by VVS): At risk for AKA, VVS note indicates improvement. ESRD - HD TTS, next tomorrow. Trying to keep SBP > 120 on HD due to #1.  Sepsis/ shock - resolved  Anemia of CKD- Hgb 8.8, continue Aranesp 274mcg q Thurs. Transfuse pRBC prn MBD: CorrCa slightly high, Phos ok. Continue home sensipar/binders, hold VDRA for now. HTN/volume - midodrine with HD. UF as tolerated w/ albumin support prn. Below prior EDW. Severe protein calorie malnutrition: NG tube in place with Nepro Tfs + supplements. DMT2 - per PMD  Dispo - likely will need SNF placement.  Veneta Penton, PA-C 10/04/2021, 10:30 AM  [employer]

## 2021-10-04 NOTE — Progress Notes (Signed)
Speech Language Pathology Treatment: Dysphagia  Patient Details Name: [name] MRN: [id number] DOB: 1980-09-13 Today's Date: 10/04/2021 Time: 7342-8768 SLP Time Calculation (min) (ACUTE ONLY): 13 min  Assessment / Plan / Recommendation Clinical Impression  Pt lethargic, with eyes closed, but responded verbally to clinician's questions and followed simple commands for POs this date. RN reports pt without difficulty taking meds whole in puree, but exhibited consistent overt coughing with dys 2 textured meats on meal tray during pm meal 2/21. This am she was reported to consume 100% of am meal without known difficulty. Pt required consistent encouragement to consume bites of simulated dys 2 solids, which she actively manipulated/masticated and swallowed without marked delay. Very minimal oral residuals noted in L buccal cavity, cleared with liquid wash and pt attempts at lingual sweep given min verbal cueing. Overt cough with sips of liquids via straw noted x1 across a series of trials. No coughing exhibited with solids during session. Recommend continue dys 2, thin liquid diet at this time with SLP to continue f/u. Spoke with RN who will be assisting with pm meal today. Requested that she notify SLP if pt has any difficulty with meal consumption. Continue to suspect safety with POs is correlated with her mentation, which continues to fluctuate. Recommend adherence to aspiration precautions. Will f/u.    HPI HPI: 41 y.o. female presented to ED 2/3 with AMS, gangrene RLE and osteomyelitis.  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 Further change in mentation 2/10; MRI brain showed scattered punctate acute infarcts throughout both cerebral hemispheres. Pt was on renal diet with 1200 ml fluid restriction prior to CVA, made NPO 2/11 and large bore NG was placed. PMHx ESRD on HD T/Th/Sat, type 2 diabetes mellitus with neuropathy, hypertension, right eye blindness, right transmetarsal amputation 08/16/21, medical non-compliance,   hypoalbuminemia & malnutrition. Palliative care has been consulted in the setting of multiple acute on chronic disease processes to further discuss goals of care. Swallow eval ordered 2/13.      SLP Plan  Continue with current plan of care      Recommendations for follow up therapy are one component of a multi-disciplinary discharge planning process, led by the attending physician.  Recommendations may be updated based on patient status, additional functional criteria and insurance authorization.    Recommendations  Diet recommendations: Dysphagia 2 (fine chop);Thin liquid Liquids provided via: Cup;Straw Medication Administration: Whole meds with puree Supervision: Full supervision/cueing for compensatory strategies Compensations: Minimize environmental distractions;Slow rate;Small sips/bites;Follow solids with liquid Postural Changes and/or Swallow Maneuvers: Seated upright 90 degrees;Upright 30-60 min after meal                Oral Care Recommendations: Oral care QID Follow Up Recommendations: Acute inpatient rehab (3hours/day) Assistance recommended at discharge: Frequent or constant Supervision/Assistance SLP Visit Diagnosis: Dysphagia, unspecified (R13.10);Dysphagia, oral phase (R13.11) Plan: Continue with current plan of care          [place], [place], [place] Office Number: [phone]  Acie Fredrickson  10/04/2021, 10:24 AM

## 2021-10-04 NOTE — Plan of Care (Signed)
  Problem: Education: Goal: Knowledge of General Education information will improve Description: Including pain rating scale, medication(s)/side effects and non-pharmacologic comfort measures Outcome: Progressing   Problem: Clinical Measurements: Goal: Ability to maintain clinical measurements within normal limits will improve Outcome: Progressing   

## 2021-10-05 DIAGNOSIS — I6340 Cerebral infarction due to embolism of unspecified cerebral artery: Secondary | ICD-10-CM | POA: Diagnosis not present

## 2021-10-05 DIAGNOSIS — M869 Osteomyelitis, unspecified: Secondary | ICD-10-CM | POA: Diagnosis not present

## 2021-10-05 DIAGNOSIS — H499 Unspecified paralytic strabismus: Secondary | ICD-10-CM

## 2021-10-05 LAB — GLUCOSE, CAPILLARY
Glucose-Capillary: 113 mg/dL — ABNORMAL HIGH (ref 70–99)
Glucose-Capillary: 133 mg/dL — ABNORMAL HIGH (ref 70–99)
Glucose-Capillary: 146 mg/dL — ABNORMAL HIGH (ref 70–99)
Glucose-Capillary: 159 mg/dL — ABNORMAL HIGH (ref 70–99)
Glucose-Capillary: 172 mg/dL — ABNORMAL HIGH (ref 70–99)
Glucose-Capillary: 257 mg/dL — ABNORMAL HIGH (ref 70–99)

## 2021-10-05 LAB — RENAL FUNCTION PANEL
Albumin: 2.1 g/dL — ABNORMAL LOW (ref 3.5–5.0)
Anion gap: 12 (ref 5–15)
BUN: 61 mg/dL — ABNORMAL HIGH (ref 6–20)
CO2: 28 mmol/L (ref 22–32)
Calcium: 9.2 mg/dL (ref 8.9–10.3)
Chloride: 93 mmol/L — ABNORMAL LOW (ref 98–111)
Creatinine, Ser: 7.23 mg/dL — ABNORMAL HIGH (ref 0.44–1.00)
GFR, Estimated: 7 mL/min — ABNORMAL LOW (ref >60)
Glucose, Bld: 210 mg/dL — ABNORMAL HIGH (ref 70–99)
Phosphorus: 4.7 mg/dL — ABNORMAL HIGH (ref 2.5–4.6)
Potassium: 5.2 mmol/L — ABNORMAL HIGH (ref 3.5–5.1)
Sodium: 133 mmol/L — ABNORMAL LOW (ref 135–145)

## 2021-10-05 LAB — CBC
HCT: 29 % — ABNORMAL LOW (ref 36.0–46.0)
Hemoglobin: 8.6 g/dL — ABNORMAL LOW (ref 12.0–15.0)
MCH: 28.8 pg (ref 26.0–34.0)
MCHC: 29.7 g/dL — ABNORMAL LOW (ref 30.0–36.0)
MCV: 97 fL (ref 80.0–100.0)
Platelets: 268 10*3/uL (ref 150–400)
RBC: 2.99 MIL/uL — ABNORMAL LOW (ref 3.87–5.11)
RDW: 25.9 % — ABNORMAL HIGH (ref 11.5–15.5)
WBC: 10.5 10*3/uL (ref 4.0–10.5)
nRBC: 0.4 % — ABNORMAL HIGH (ref 0.0–0.2)

## 2021-10-05 LAB — PHOSPHORUS: Phosphorus: 5 mg/dL — ABNORMAL HIGH (ref 2.5–4.6)

## 2021-10-05 LAB — MAGNESIUM: Magnesium: 2.5 mg/dL — ABNORMAL HIGH (ref 1.7–2.4)

## 2021-10-05 NOTE — Progress Notes (Signed)
Progress Note   Patient: [name] OJJ:[id number] DOB: 07/11/1981 DOA: 09/15/2021     20 DOS: the patient was seen and examined on 10/05/2021   Brief hospital course: 41 year old female with history of ESRD on dialysis Tuesdays Thursdays and Saturday, poorly controlled type 2 diabetes, hypertension she was initially sent in from dialysis session due to concern for altered mental status and lethargy.  She had a transmetatarsal amputation of the right foot 5 weeks prior to admission which grew MSSA and was receiving Ancef at dialysis.  X-ray of the right stump suggested osteomyelitis vascular surgery consulted recommended a below-knee amputation and she underwent BKA on 09/17/2021. From 09/18/2021 patient kept her eyes closed and was somnolent and was not able to or refusing to open her eyes.  On 09/22/2021 rapid response called for lethargy and minimally responsive to sternal rub PCCM and neurology were consulted.  MRI obtained shows scattered punctuate acute infarcts bilaterally and confluent region of the left corona radiata.  CT angiogram of the head and neck showed no large vessel occlusion.  EEG showed diffuse encephalopathy.  She was given pressors overnight and started antibiotics for possible endocarditis and was started on aspirin.  Echocardiogram shows positive for shunting and bicuspid aortic valve. She had a core track tube placed and feedings were started.    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  Patient followed by neurology. Transesophageal echo on 10/02/2021 showed small PFO with no thrombus.  On aspirin 325 mg daily.  Patient was started on Rocephin for possible endocarditis which has been stopped on 09/27/2021. CIR following.  #2 type 2 diabetes with hypoglycemia semiglee dose is being adjusted currently on 16 units nightly.  No further hypoglycemia. CBG (last 3)  Recent Labs    10/05/21 0415 10/05/21 1007 10/05/21 1329  GLUCAP 257* 146* 113*      #3 end-stage renal disease on hemodialysis Tuesday Thursday and Saturday  #4 hypotension resolved midodrine Dc'd  #5 moderate malnutrition due to acute illness as evidenced by fat and muscle depletion.  Continue Nepro shakes.  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  Appreciate Dr. Yvetta Coder input.  Discussed with patient's new healthcare POA [name] who is her sister.  I explained to her about obtaining an MRI of her orbits and cavernous sinuses with contrast and the less than 1% chance of developing nephrogenic systemic fibrosis.  Patient will be scheduled for MRI Saturday morning and will go to dialysis immediately after the MRI.  This was discussed in detail with her POA who gives me consent to do MRI as the benefits outweigh the risk.    Subjective: Resting in bed Physical Exam: Vitals:   10/05/21 0830 10/05/21 0900 10/05/21 0930 10/05/21 0947  BP: 110/70 116/73 (!) 96/59 109/63  Pulse: 97 97 100 100  Resp: 16 18 19 14   Temp:    98 F (36.7 C)  TempSrc:    Oral  SpO2: 100% 98% 98% 98%  Weight:   57.1 kg   Height:       General exam: in nad   HEENT:  no sclera icterus or thrush Respiratory system: Clear to auscultation. Respiratory effort normal. Cardiovascular system: S1 & S2 heard, regular rate and rhythm Gastrointestinal system: Abdomen soft, non-tender, nondistended. Normal bowel sounds   Central nervous system: keeps eyes closes, answers questions  and follows commands Extremities:right bka staples in place cdi Skin: No rashes or ulcers Psychiatry:  Mood & affect appropriate.   Data Reviewed:mag 2.4 phos 4.6   Family Communication: none   Disposition:cir vs snf  Status is: Inpatient   Planned Discharge Destination: Skilled nursing facility  Dvt prophylaxis heparin  Time spent: 42 min  minutes  Author: Georgette Shell, MD 10/05/2021 3:06 PM  For on call review [email or web address].

## 2021-10-05 NOTE — Progress Notes (Signed)
[facility] Progress Note   Subjective:  Seen on HD - 1.5L UFG and tolerating, but drowsy this morning. Denies CP/dysnea. C/o pain at nose with NG tube.  Objective Vitals:   10/04/21 2239 10/05/21 0419 10/05/21 0734 10/05/21 0744  BP: (!) 142/61 92/76 136/74 136/81  Pulse: 99 100 97 95  Resp: 14 17 15 18   Temp: 98.5 F (36.9 C) 98.5 F (36.9 C) 98 F (36.7 C)   TempSrc: Axillary Axillary Temporal   SpO2: 99% 99% 100%   Weight:   57.6 kg   Height:       Physical Exam General: Chronically ill appearing woman, NAD. NG tube in place. Heart: RRR; no murmur Lungs: CTA anteriorly Abdomen: soft, non-tender Extremities: R BKA, no LLE edema Dialysis Access: R AVF + thrill  Additional Objective Labs: Basic Metabolic Panel: Recent Labs  Lab 09/30/21 0838 10/03/21 0804 10/03/21 2125 10/04/21 0503 10/04/21 1951 10/05/21 0644  NA 133* 128*  --   --   --   --   K 3.7 5.0  --   --   --   --   CL 93* 90*  --   --   --   --   CO2 25 24  --   --   --   --   GLUCOSE 235* 100*  --   --   --   --   BUN 46* 65*  --   --   --   --   CREATININE 6.88* 7.90*  --   --   --   --   CALCIUM 8.3* 9.0  --   --   --   --   PHOS 4.8*  --    < > 4.6 4.9* 5.0*   < > = values in this interval not displayed.   Liver Function Tests: Recent Labs  Lab 09/30/21 0838 10/03/21 0804  AST  --  24  ALT  --  15  ALKPHOS  --  92  BILITOT  --  0.6  PROT  --  7.2  ALBUMIN 1.8* 2.0*   CBC: Recent Labs  Lab 09/30/21 0837 10/03/21 0804 10/05/21 0807  WBC 8.7 11.8* 10.5  HGB 8.2* 8.8* 8.6*  HCT 28.4* 29.1* 29.0*  MCV 97.3 96.0 97.0  PLT 243 290 268   Medications:   aspirin EC  81 mg Oral Daily   atorvastatin  40 mg Oral Daily   Chlorhexidine Gluconate Cloth  6 each Topical Q0600   cinacalcet  90 mg Oral Q supper   clopidogrel  75 mg Oral Daily   darbepoetin (ARANESP) injection - DIALYSIS  200 mcg Intravenous Q Thu-HD   dorzolamide-timolol  1 drop Left Eye BID   feeding  supplement (NEPRO CARB STEADY)  780 mL Per Tube Q24H   Gerhardt's butt cream   Topical TID   heparin injection (subcutaneous)  5,000 Units Subcutaneous Q8H   insulin aspart  0-6 Units Subcutaneous TID WC   insulin glargine-yfgn  16 Units Subcutaneous QHS   lidocaine-prilocaine  1 application Topical UD   mouth rinse  15 mL Mouth Rinse BID   multivitamin  1 tablet Oral QHS   nutrition supplement (JUVEN)  1 packet Per Tube BID BM   pantoprazole sodium  40 mg Oral Daily   sevelamer carbonate  1.6 g Oral TID WC    Dialysis Orders: [facility] 3h 46min   400/ 600   59.5kg   2/2 bath  Prof 2  RU AV fistula  Hep 4000  Mircera 200 mcg IV every 2 weeks - last 1/26 Ancef 2g 3 times weekly HD Covid neg 2/3, hep B SAg neg 2/4   Assessment/Plan: AMS - acute embolic CVA and/or watershed infarcts. With PFO on TTE. TEE showed small PFO and no clot. Blood Cx 2/4 negative. S/p course of IV abx.  Hx R BKA (2/5 by VVS): At risk for AKA, VVS note indicates improvement. ESRD - Usually TTS schedule - HD now. Trying to keep SBP > 120 on HD due to #1.  Sepsis/ shock - resolved  Anemia of CKD- Hgb 8.6, continue Aranesp 283mcg q Thurs. Transfuse pRBC prn MBD: CorrCa slightly high, Phos ok. Continue home sensipar/binders, hold VDRA for now. HTN/volume - midodrine with HD. UF as tolerated w/ albumin support prn. Below prior EDW. Severe protein calorie malnutrition: NG tube in place with Nepro TF's + supplements. DMT2 - per PMD  Dispo - likely will need SNF placement.  Veneta Penton, PA-C 10/05/2021, 8:25 AM  [employer]

## 2021-10-05 NOTE — Progress Notes (Addendum)
Subjective: In dialysis.   Objective: Current vital signs: BP 109/63 (BP Location: Left Arm)    Pulse 100    Temp 98 F (36.7 C) (Oral)    Resp 14    Ht '5\' 1"'  (1.549 m)    Wt 57.1 kg    SpO2 98%    BMI 23.79 kg/m  Vital signs in last 24 hours: Temp:  [98 F (36.7 C)-98.5 F (36.9 C)] 98 F (36.7 C) (02/23 0947) Pulse Rate:  [95-100] 100 (02/23 0947) Resp:  [14-19] 14 (02/23 0947) BP: (92-142)/(45-81) 109/63 (02/23 0947) SpO2:  [98 %-100 %] 98 % (02/23 0947) Weight:  [57.1 kg-57.6 kg] 57.1 kg (02/23 0930)  Intake/Output from previous day: 02/22 0701 - 02/23 0700 In: 170 [P.O.:170] Out: -  Intake/Output this shift: Total I/O In: -  Out: 518 [Other:518] Nutritional status:  Diet Order             DIET DYS 2 Room service appropriate? No; Fluid consistency: Thin  Diet effective now                  HEENT: [place]/AT.  Lungs: Respirations unlabored Ext: Dialysis catheter at site of RUE fistula. Right BKA. Left lower extremity with chronic skin changes.   Neurologic Exam: Mental Status: Awakens to voice. Speech is slow and sparse, but fluent. Oriented to the day, month, year, city and state. No dysarthria. Able to follow all simple commands, but is poorly compliant.  Cranial Nerves:  II: Right pupil irregular and 2-3 mm with no reactivity. Left pupil with minimal sluggish reactivity, irregular and 3 mm. Complete blindness OD and severe loss of visual acuity OS. III,IV, VI: Severe bilateral ptosis that localizes to the 3rd cranial nerve. Ocular motility is severely impaired; she is exotropic with right eye deviated laterally and only about 3 mm of medial rotation of the globe when asked to look to the left. Left eye with severely decreased motility than right eye, but less profound than on the right; left eye partially abducted with about 4 mm of movement medially and about 2 mm of movement in upward and downward gaze in the context of poor patient cooperation. V: Reacts to touch  bilaterally VII: Right facial droop VIII: Hearing intact to voice IX,X: Phonation intact. No hoarseness noted.  XI: Noncooperative XII: Noncooperative Motor: Poor effort throughout.  RUE: Unable to move RUE due to dialysis catheter; grip 4/5. LUE 4/5 proximally and distally with poor effort.  RLE: Will move slightly at hip and knee with 2/5 strength in the context of poor cooperation and BKA LLE: Will minimally flex and extend foot to command. Refuses to follow additional instructions for motor testing.  Sensory: Nods yes when asked if she can feel gross touch to BUE and BLE.  Deep Tendon Reflexes:  1+ left brachioradialis and bilateral patellae. No left achilles. RLE with BKA.  Plantars: Right BKA. Left foot pain precludes assessment of Babinski.  Cerebellar: Unable to participate in exam/noncooperative.  Gait: Unable to assess    Lab Results: Results for orders placed or performed during the hospital encounter of 09/15/21 (from the past 48 hour(s))  Glucose, capillary     Status: Abnormal   Collection Time: 10/03/21 11:48 AM  Result Value Ref Range   Glucose-Capillary 63 (L) 70 - 99 mg/dL    Comment: Glucose reference range applies only to samples taken after fasting for at least 8 hours.  Glucose, capillary     Status: None   Collection  Time: 10/03/21  4:55 PM  Result Value Ref Range   Glucose-Capillary 99 70 - 99 mg/dL    Comment: Glucose reference range applies only to samples taken after fasting for at least 8 hours.  Glucose, capillary     Status: Abnormal   Collection Time: 10/03/21  8:11 PM  Result Value Ref Range   Glucose-Capillary 172 (H) 70 - 99 mg/dL    Comment: Glucose reference range applies only to samples taken after fasting for at least 8 hours.  Glucose, capillary     Status: Abnormal   Collection Time: 10/03/21  8:22 PM  Result Value Ref Range   Glucose-Capillary 153 (H) 70 - 99 mg/dL    Comment: Glucose reference range applies only to samples taken  after fasting for at least 8 hours.  Magnesium     Status: None   Collection Time: 10/03/21  9:25 PM  Result Value Ref Range   Magnesium 2.3 1.7 - 2.4 mg/dL    Comment: Performed at [facility], [place] [address], [place], [place] [address]  Phosphorus     Status: Abnormal   Collection Time: 10/03/21  9:25 PM  Result Value Ref Range   Phosphorus 5.8 (H) 2.5 - 4.6 mg/dL    Comment: Performed at [facility] [address], [place], [place] [address]  Glucose, capillary     Status: Abnormal   Collection Time: 10/03/21 11:42 PM  Result Value Ref Range   Glucose-Capillary 215 (H) 70 - 99 mg/dL    Comment: Glucose reference range applies only to samples taken after fasting for at least 8 hours.  Glucose, capillary     Status: Abnormal   Collection Time: 10/04/21  4:46 AM  Result Value Ref Range   Glucose-Capillary 252 (H) 70 - 99 mg/dL    Comment: Glucose reference range applies only to samples taken after fasting for at least 8 hours.  Magnesium     Status: None   Collection Time: 10/04/21  5:03 AM  Result Value Ref Range   Magnesium 2.4 1.7 - 2.4 mg/dL    Comment: Performed at [facility] [address], [place], [place] [address]  Phosphorus     Status: None   Collection Time: 10/04/21  5:03 AM  Result Value Ref Range   Phosphorus 4.6 2.5 - 4.6 mg/dL    Comment: Performed at [facility] [address] [place], [place] [address]  Glucose, capillary     Status: Abnormal   Collection Time: 10/04/21  8:38 AM  Result Value Ref Range   Glucose-Capillary 270 (H) 70 - 99 mg/dL    Comment: Glucose reference range applies only to samples taken after fasting for at least 8 hours.  Glucose, capillary     Status: Abnormal   Collection Time: 10/04/21 12:17 PM  Result Value Ref Range   Glucose-Capillary 238 (H) 70 - 99 mg/dL    Comment: Glucose reference range applies only to samples taken after fasting for at least 8 hours.  Glucose, capillary     Status: Abnormal    Collection Time: 10/04/21  3:55 PM  Result Value Ref Range   Glucose-Capillary 195 (H) 70 - 99 mg/dL    Comment: Glucose reference range applies only to samples taken after fasting for at least 8 hours.  Magnesium     Status: None   Collection Time: 10/04/21  7:51 PM  Result Value Ref Range   Magnesium 2.3 1.7 - 2.4  mg/dL    Comment: Performed at [facility], [place] [address], [place], [place] [address]  Phosphorus     Status: Abnormal   Collection Time: 10/04/21  7:51 PM  Result Value Ref Range   Phosphorus 4.9 (H) 2.5 - 4.6 mg/dL    Comment: Performed at [facility] [address] [place], [place] [address]  Glucose, capillary     Status: Abnormal   Collection Time: 10/04/21 11:58 PM  Result Value Ref Range   Glucose-Capillary 236 (H) 70 - 99 mg/dL    Comment: Glucose reference range applies only to samples taken after fasting for at least 8 hours.  Glucose, capillary     Status: Abnormal   Collection Time: 10/05/21  4:15 AM  Result Value Ref Range   Glucose-Capillary 257 (H) 70 - 99 mg/dL    Comment: Glucose reference range applies only to samples taken after fasting for at least 8 hours.  Magnesium     Status: Abnormal   Collection Time: 10/05/21  6:44 AM  Result Value Ref Range   Magnesium 2.5 (H) 1.7 - 2.4 mg/dL    Comment: Performed at [facility] [address], [place], [place] [address]  Phosphorus     Status: Abnormal   Collection Time: 10/05/21  6:44 AM  Result Value Ref Range   Phosphorus 5.0 (H) 2.5 - 4.6 mg/dL    Comment: Performed at [facility] [address] [place], [place] [address]  CBC     Status: Abnormal   Collection Time: 10/05/21  8:07 AM  Result Value Ref Range   WBC 10.5 4.0 - 10.5 K/uL   RBC 2.99 (L) 3.87 - 5.11 MIL/uL   Hemoglobin 8.6 (L) 12.0 - 15.0 g/dL   HCT 29.0 (L) 36.0 - 46.0 %   MCV 97.0 80.0 - 100.0 fL   MCH 28.8 26.0 - 34.0 pg   MCHC 29.7 (L) 30.0 - 36.0 g/dL   RDW 25.9 (H) 11.5 - 15.5 %   Platelets  268 150 - 400 K/uL   nRBC 0.4 (H) 0.0 - 0.2 %    Comment: Performed at [facility] [address] [place], [place] [address]  Renal function panel     Status: Abnormal   Collection Time: 10/05/21  8:07 AM  Result Value Ref Range   Sodium 133 (L) 135 - 145 mmol/L   Potassium 5.2 (H) 3.5 - 5.1 mmol/L   Chloride 93 (L) 98 - 111 mmol/L   CO2 28 22 - 32 mmol/L   Glucose, Bld 210 (H) 70 - 99 mg/dL    Comment: Glucose reference range applies only to samples taken after fasting for at least 8 hours.   BUN 61 (H) 6 - 20 mg/dL   Creatinine, Ser 7.23 (H) 0.44 - 1.00 mg/dL   Calcium 9.2 8.9 - 10.3 mg/dL   Phosphorus 4.7 (H) 2.5 - 4.6 mg/dL   Albumin 2.1 (L) 3.5 - 5.0 g/dL   GFR, Estimated 7 (L) >60 mL/min    Comment: (NOTE) Calculated using the CKD-EPI Creatinine Equation (2021)    Anion gap 12 5 - 15    Comment: Performed at [facility] [address], [place], [place] [address]  Glucose, capillary     Status: Abnormal   Collection Time: 10/05/21 10:07 AM  Result Value Ref Range   Glucose-Capillary 146 (H) 70 - 99 mg/dL    Comment: Glucose reference range applies only  to samples taken after fasting for at least 8 hours.    No results found for this or any previous visit (from the past 240 hour(s)).  Lipid Panel No results for input(s): CHOL, TRIG, HDL, CHOLHDL, VLDL, LDLCALC in the last 72 hours.  Studies/Results: No results found.  Medications: Scheduled:  aspirin EC  81 mg Oral Daily   atorvastatin  40 mg Oral Daily   Chlorhexidine Gluconate Cloth  6 each Topical Q0600   cinacalcet  90 mg Oral Q supper   clopidogrel  75 mg Oral Daily   darbepoetin (ARANESP) injection - DIALYSIS  200 mcg Intravenous Q Thu-HD   dorzolamide-timolol  1 drop Left Eye BID   feeding supplement (NEPRO CARB STEADY)  780 mL Per Tube Q24H   Gerhardt's butt cream   Topical TID   heparin injection (subcutaneous)  5,000 Units Subcutaneous Q8H   insulin aspart  0-6 Units Subcutaneous TID WC    insulin glargine-yfgn  16 Units Subcutaneous QHS   lidocaine-prilocaine  1 application Topical UD   mouth rinse  15 mL Mouth Rinse BID   multivitamin  1 tablet Oral QHS   nutrition supplement (JUVEN)  1 packet Per Tube BID BM   pantoprazole sodium  40 mg Oral Daily   sevelamer carbonate  1.6 g Oral TID WC    Assessment: 41 y.o. female with history of ESRD on HD T/Th/Sat, type 2 diabetes mellitus with neuropathy, hypertension, osteomyelitis with BKA, right eye blindness, and medical non-adherence presenting with  lethargy and AMS following HD on 2/3.  Initial work up in the ED revealed patient with leukocytosis concerning for infection and imaging with concern for osteomyelitis for which vascular surgery recommended admission for BKA on 2/5 and ongoing antibiotics. Of note, patient was discharged in January following urgent transmetatarsal amputation after being found with gangrenous toes s/p wound vac and IV Ancef after wound cultures grew MSSA. During hospitalization, patient's mental status initially improved and she was responding appropriately on evaluation on 2/4 with surgery for BKA on 2/5. On 2/6, she was noted to be somnolent and slow to respond but she was answering questions appropriately. On 2/7 patient had an episode of hypotension with HD but was noted to still be lethargic but followed commands and answered questions appropriately. Patient again had HD on 2/9 and became more lethargic, patient lethargic on exam during HD on 2/11. TEE completed- no thrombus detected, small PFO noted. Start atorvastatin, ASA 80m and plavix 763m  - Stroke: Inpatient stroke work up completed. Consider 30 day cardiac event monitoring as outpt.  - Encephalopathy: EEG shows mild generalized slowing. Focal slowing is seen intermittently in the right centrotemporal region. No electrographic seizures were recorded.  - Bilateral ptosis Patient has not been able to open eyes for quite some time. She states  that this has been ongoing for about the past month. She endorses total loss of vision in her right eye that is and severe loss of vision in her left eye which she estimates has been present for about 1 month as well. even follows all simple commands and answer question appropriately.  Exam confirms complete blindness OD and severe loss of visual acuity OS. Also with severe bilateral ptosis that localizes to the 3rd cranial nerve. Right pupil irregular and 2-3 mm with no reactivity. Left pupil with minimal sluggish reactivity, irregular and 3 mm. Ocular motility is severely impaired; she is exotropic with right eye deviated laterally and only about 3 mm of medial rotation  of the globe when asked to look to the left. Left eye with severely decreased motility than right eye, but less profound than on the right; left eye partially abducted with about 4 mm of movement medially and about 2 mm of movement in upward and downward gaze in the context of poor patient cooperation. Overall findings best localize to the cavernous sinuses bilaterally, possibly secondary to an inflammatory process (possibly severe manifestations of Tolosa-Hunt syndrome, an infiltrative process or bilateral cavernous sinus thrombosis). She also states that touching her left eyelid and orbital rim during exam is painful, which would be compatible with the above DDx.  Eye opening apraxia is felt to be unlikely. Cranial polyneuropathy including CN III IV VI is possible. Miller-Fisher variant of GBS is felt to be unlikely, but is possible. Critical illness neuropathy/myopathy would not be expected to present disproportionately with eye involvement.  Discussed best imaging modality with Neuroradiology. The former guidelines against use of MRI contrast in patients with ESRD have been relaxed as the newer contrast agents have a substantially lower to no documented risk of nephrogenic systemic fibrosis if patients are dialyzed after MRI. Neuroradiology  recommends an MRI of the orbits with and without contrast with image window to include the cavernous sinuses to assess for orbital apex tumor, orbital pseudotumor, cavernous sinus thrombosis and cavernous sinus inflammatory process or tumor.  Per Radiology official guidelines:  In patients with acute kidney injury (AKI), chronic kidney disease (CKD) Stage 4 or 5 (estimated Glomerular Filtration Rate [eGFR] < 30 mL/min/1.44m) or those on dialysis, macrocyclic or newer linear (gadobenate dimeglumine and gadoxetate disodium) Gadolinium based contrast agents (GBCA) can be administered when GBCA-enhanced MRI is considered necessary and no alternative test is available. Informed consent should be obtained in these at-risk populations, either verbally or using a written consent form, by a physician or their delegate (including MRI technologists) citing an exceedingly low (much less than 1%) chance of developing Nephrogenic Systemic Fibrosis (NSF) using macrocyclic and newer linear GBCA. Patients on dialysis should continue dialysis and hemodialysis should ideally be scheduled within 2-3 hours after GBCA administration. There is insufficient evidence to support initiating dialysis, switching from peritoneal to hemodialysis or altering dialysis regimes to further reduce the risk of NSF. Routine Nephrology consultations when GBCA is being considered in at-risk populations is not recommended.    Recommendations: - May need LP. Will consider if imaging does not reveal a structural etiology for her bilateral ptosis and ophthalmoplegia - MRI of orbits with and without contrast with special attention to the cavernous sinuses and orbital apex bilaterally. Can administer IV gadolinium contrast in ESRD provided that dialysis is obtained immediately afterwards. Will need to coordinate with Nephrology.  - Will need to obtain informed consent from the patient prior to MRI. Neurology will order the scan after consent is  obtained by the primary team.   Addendum: - Family has given informed consent to Dr. MZigmund Danielto have IV gadolinium administered for her MRI scan. Risk of NSF discussed with family. Benefits of diagnostic information obtained significantly outweigh the risks.  - Will need dialysis immediately after the MRI scan on Saturday - MRI of orbits with and without contrast has been ordered for 8:00 AM on Saturday    LOS: 20 days   '@Electronically'  signed: Dr. EKerney Elbe2/23/2023  10:17 AM

## 2021-10-05 NOTE — Progress Notes (Addendum)
Calorie Count Note -- 48 hour calorie count ordered   Diet: Dysphagia 2 w/ thin liquids   Day 1 Results (10/03/21): Breakfast: no available documentation  Lunch: 48 kcal, 3 grams of protein Dinner: 569 kcal, 11 grams of protein  Total intake: 617 kcal (34% of minimum estimated needs)  14 grams protein (15% of minimum estimated needs)  Day 2 Results (10/04/21): Breakfast: 605 kcal, 28 grams protein Lunch: no available documentation  Dinner: 91 kcal, 1 grams of protein  Total intake: 696 kcal (38% of minimum estimated needs)  29 grams protein (32% of minimum estimated needs)  Estimated Nutritional Needs:  Kcal: 1800-2000 kcal/d Protein: 90-100 g/d Fluid: 1L+UOP  Nutrition Dx: Moderate Malnutrition related to acute illness (osteomyelitis of R foot) as evidenced by mild fat depletion, moderate muscle depletion. -- ongoing   Goal: Patient will meet greater than or equal to 90% of their needs -- progressing  Intervention:  -Recommend consideration of PEG for long-term nutrition support given pt with ongoing poor PO intake -Continue Nepro @ 65 ml/hr x 12 hours from 1800 to 0600 (total of 780 ml) via Cortrak (provides 1404 kcal, 63 grams of protein, and 567 ml of H2O to meet 78% and 70% of minimum estimated kcal and protein needs, respectively)  -Continue renavit daily -Continue 1 packet Juven BID per tube, each packet provides 95 calories, 2.5 grams of protein (collagen), and 9.8 grams of carbohydrate (3 grams sugar); also contains 7 grams of L-arginine and L-glutamine, 300 mg vitamin C, 15 mg vitamin E, 1.2 mcg vitamin B-12, 9.5 mg zinc, 200 mg calcium, and 1.5 g  Calcium Beta-hydroxy-Beta-methylbutyrate to support wound healing -Continue to provide feeding assistance with all meals and snacks      Estill Bamberg A., MS, RD, LDN (she/her/hers) RD pager number and weekend/on-call pager number located in [place].

## 2021-10-05 NOTE — Progress Notes (Signed)
PT Cancellation Note  Patient Details Name: [name] MRN: [id number] DOB: 1981/05/06   Cancelled Treatment:    Reason Eval/Treat Not Completed: Patient at procedure or test/unavailable (HD). Will follow-up for PT treatment post-HD as schedule permits.  Mabeline Caras, PT, DPT Acute Rehabilitation Services  Pager [phone] Office [phone]  Derry Lory 10/05/2021, 7:43 AM

## 2021-10-05 NOTE — Progress Notes (Incomplete)
Inpatient Rehabilitation Admission Medication Review by a Pharmacist  A complete drug regimen review was completed for this patient to identify any potential clinically significant medication issues.  High Risk Drug Classes Is patient taking? Indication by Medication  Antipsychotic Yes Compazine- N/V  Anticoagulant Yes Heparin- VTE prophylaxis  Antibiotic No   Opioid Yes Norco- acute pain  Antiplatelet Yes Aspirin, plavix- CVA prophylaxis  Hypoglycemics/insulin Yes Semglee, iSS- T2DM  Vasoactive Medication No   Chemotherapy No   Other Yes Lipitor- HLD Renvela- hyperphosphatemia in the setting of ESRD Sensipar- hyperparathyroidism in the setting of ESRD Aranesp- anemia in the setting of ESRD Trazodone- sleep     Type of Medication Issue Identified Description of Issue Recommendation(s)  Drug Interaction(s) (clinically significant)     Duplicate Therapy     Allergy     No Medication Administration End Date  Plavix DAPT therapy x3 weeks f/b aspirin alone. End date for Plavix is October 23, 2021  Incorrect Dose     Additional Drug Therapy Needed     Significant med changes from prior encounter (inform family/care partners about these prior to discharge).    Other  PTA meds: Rocaltrol Cozaar Per nephrology- hold VDRA (calcitriol). That service will address any restarts. She is ESRD on HD TTS schedule  Cozaar- restart if and when clinically necessary during CIR admission or at time of discharge if clinically warranted.     Clinically significant medication issues were identified that warrant physician communication and completion of prescribed/recommended actions by midnight of the next day:  No  Time spent performing this drug regimen review (minutes):  30   Dalyah Pla BS, PharmD, BCPS Clinical Pharmacist 10/05/2021 12:41 PM

## 2021-10-05 NOTE — Plan of Care (Signed)
°  Problem: Safety: Goal: Ability to remain free from injury will improve Outcome: Progressing   Problem: Skin Integrity: Goal: Risk for impaired skin integrity will decrease Outcome: Not Progressing   Pt has multiple abrasions and lesions to her buttocks and perineum area. Gerhardt applied and foam dressings

## 2021-10-05 NOTE — Procedures (Signed)
Patient opted to end dialysis early. Encourage to stay a bit more patient became disruptive and unsafe to care for as she began to pull needles and was yelling in dialysis bay.

## 2021-10-05 NOTE — Progress Notes (Signed)
OT Cancellation Note  Patient Details Name: [name] MRN: [id number] DOB: June 08, 1981   Cancelled Treatment:    Reason Eval/Treat Not Completed: Patient at procedure or test/ unavailable Joeseph Amor OTR/L  Acute Rehab Services  [phone] office number [phone] pager number   Joeseph Amor 10/05/2021, 9:10 AM

## 2021-10-05 NOTE — Progress Notes (Addendum)
IP rehab admissions - I do have a bed on CIR available for this patient today.  Noted Dr. Cheral Marker to see patient today.  If further workup is needed, we can hold on CIR for today.  I will await discussion with attending/neurology to see if we should admit today or hold for today.  Call for questions.  [phone]  We are holding on admit to CIR until all workup is completed.  Will follow along with you for medical readiness and work up completion.  [phone]

## 2021-10-05 NOTE — H&P (Incomplete)
Physical Medicine and Rehabilitation Admission H&P    No chief complaint on file. CC: Functional deficits due to bilateral punctate infarcts of cerebral hemispheres, recent right BKA HPI: [name] is a 42 year old female who presented with breakdown and gangrene of her right transmetatarsal amputation site on 2/3.  She had mild mental status changes and felt this was likely to acute metabolic encephalopathy.  Broad-spectrum antibiotics were initiated and vascular surgery consulted.  Blood cultures negative.  She underwent right below-knee amputation by Dr. Trula Slade on 2/5.  She became more lethargic and neurology was consulted.  MRI of the brain performed 2/10 revealed scattered punctate acute infarcts throughout both cerebral hemispheres possibly embolic, potentially subacute infarction in the left corona radiata and chronic ischemia with multiple chronic infarcts also noted.  Generalized and focal slowing seen on EEG with no electrographic seizures recorded.  She was started on aspirin 300 mg suppository daily and pharmacologic VTE prophylaxis with subcutaneous heparin.  She underwent TEE which showed a very small PFO.  Recommended considering 30-day cardiac event monitoring as outpatient.  Plavix therapy initiated 2/21.  She exhibited bilateral ptosis and multiple differential diagnoses considered.  Recommended continuing Plavix and aspirin for 3 weeks then aspirin alone.  Other significant medical history includes end-stage renal disease on intermittent hemodialysis on Tuesday, Thursday, Saturday schedule.  Nephrology consulted.  History diabetes mellitus type 2 uncontrolled.  Hemoglobin A1c 10.4 on 08/11/2021.  History of anemia of chronic disease, right eye blindness.  ROS Past Medical History:  Diagnosis Date   Anemia    Anxiety    Blind right eye 2008   Diabetes mellitus without complication ([facility])    Dialysis patient ([facility])    ESRD (end stage renal disease) ([facility])    Dialysis  T/Th/Sa   Past Surgical History:  Procedure Laterality Date   AMPUTATION Right 08/11/2021   Procedure: TRANSMETATARSAL AMPUTATION OF RIGHT FOOT;  Surgeon: Serafina Mitchell, MD;  Location: [facility];  Service: Vascular;  Laterality: Right;   AMPUTATION Right 09/17/2021   Procedure: AMPUTATION RIGHT BELOW KNEE;  Surgeon: Serafina Mitchell, MD;  Location: [facility];  Service: Vascular;  Laterality: Right;   AMPUTATION TOE Left    APPLICATION OF WOUND VAC  08/16/2021   Procedure: APPLICATION OF WOUND VAC;  Surgeon: Serafina Mitchell, MD;  Location: [facility];  Service: Vascular;;   AV FISTULA PLACEMENT     BUBBLE STUDY  10/02/2021   Procedure: BUBBLE STUDY;  Surgeon: Fay Records, MD;  Location: [facility];  Service: Cardiovascular;;   CESAREAN SECTION  2011   FISTULA SUPERFICIALIZATION Right 66/29/4765   Procedure: 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;  Surgeon: Angelia Mould, MD;  Location: [facility];  Service: Vascular;  Laterality: Right;   INSERTION OF DIALYSIS CATHETER Left 07/27/2019   Procedure: INSERTION OF TUNNELED  DIALYSIS CATHETER;  Surgeon: Angelia Mould, MD;  Location: [facility];  Service: Vascular;  Laterality: Left;   LOWER EXTREMITY ANGIOGRAPHY N/A 08/11/2021   Procedure: LOWER EXTREMITY ANGIOGRAPHY;  Surgeon: Serafina Mitchell, MD;  Location: [facility];  Service: Cardiovascular;  Laterality: N/A;   TEE WITHOUT CARDIOVERSION N/A 10/02/2021   Procedure: TRANSESOPHAGEAL ECHOCARDIOGRAM (TEE);  Surgeon: Fay Records, MD;  Location: [facility];  Service: Cardiovascular;  Laterality: N/A;   TRANSMETATARSAL AMPUTATION Right 08/16/2021   Procedure: CLOSURE OF RIGHT TRANSMETATARSAL AMPUTATION;  Surgeon: Serafina Mitchell, MD;  Location: [facility];  Service: Vascular;  Laterality: Right;   Family  History  Problem Relation Age of [place]   Diabetes Mother    Diabetes Father    Social History:  reports that she has never smoked. She has never used smokeless tobacco. She  reports that she does not drink alcohol and does not use drugs. Allergies:  Allergies  Allergen Reactions   Morphine Rash and Other (See Comments)   Peanut-Containing Drug Products Anaphylaxis and Hives   Penicillins Rash    Rash in 2008.  Tolerated cefazolin in 2020   Chocolate Hives   Medications Prior to Admission  Medication Sig Dispense Refill   Biotin w/ Vitamins C & E (HAIR/SKIN/NAILS PO) Take 1 capsule by mouth daily.     calcitRIOL (ROCALTROL) 0.25 MCG capsule Take 0.25 mcg by mouth 2 (two) times daily.      [EXPIRED] ceFAZolin (ANCEF) IVPB Inject 2 g into the vein Every Tuesday,Thursday,and Saturday with dialysis. Indication:  osteomyelitis First Dose: No Last Day of Therapy:  09/21/21 with dialysis Labs - Once weekly:  CBC/D and BMP, Labs - Every other week:  ESR and CRP Method of administration: IV Push Method of administration may be changed at the discretion of home infusion pharmacist based upon assessment of the patient and/or caregiver's ability to self-administer the medication ordered. 29 Units 0   cinacalcet (SENSIPAR) 90 MG tablet Take 90 mg by mouth daily.      diphenhydramine-acetaminophen (TYLENOL PM) 25-500 MG TABS tablet Take 1 tablet by mouth at bedtime as needed (sleep).     dorzolamide-timolol (COSOPT) 22.3-6.8 MG/ML ophthalmic solution Place 1 drop into the left eye 2 (two) times daily.     lidocaine-prilocaine (EMLA) cream Apply 1 application topically as directed.      losartan (COZAAR) 50 MG tablet Take 1 tablet (50 mg total) by mouth daily. 30 tablet 2   multivitamin (RENA-VIT) TABS tablet Take 1 tablet by mouth daily. 90 tablet 3   oxyCODONE-acetaminophen (PERCOCET) 5-325 MG tablet Take 1 tablet by mouth every 6 (six) hours as needed for severe pain. 30 tablet 0   [EXPIRED] sevelamer carbonate (RENVELA) 0.8 g PACK packet Take 1.6 g by mouth 3 (three) times daily with meals. 180 packet 0   Accu-Chek FastClix Lancets MISC Use as instructed to check blood  sugar up to TID. E11.22 (Patient taking differently: 1 each by Other route See admin instructions. Use as instructed to check blood sugar up to TID. E11.22) 102 each 11   Blood Glucose Monitoring Suppl (ACCU-CHEK GUIDE ME) w/Device KIT 1 kit by Does not apply route 3 (three) times daily. Use to check BG at home up to 3 times daily. E11.22 1 kit 0   Continuous Blood Gluc Receiver (DEXCOM G6 RECEIVER) DEVI 1 Device by Does not apply route as directed. 1 each 0   Continuous Blood Gluc Sensor (DEXCOM G6 SENSOR) MISC 1 Device by Does not apply route as directed. 9 each 3   Continuous Blood Gluc Transmit (DEXCOM G6 TRANSMITTER) MISC 1 Device by Does not apply route as directed. 1 each 3   glucose blood (ACCU-CHEK GUIDE) test strip Use as instructed to check blood sugar up to TID. E11.22 (Patient taking differently: 1 each by Other route See admin instructions. Use as instructed to check blood sugar up to TID. E11.22) 100 each 12   Insulin Pen Needle 32G X 4 MM MISC 1 Device by Does not apply route as directed. 150 each 11      Home: Home Living Family/patient expects to be discharged to::  Private residence Living Arrangements: Alone Available Help at Discharge: Family Type of Home: House Home Access: Level entry Home Layout: Two level, Bed/bath upstairs Alternate Level Stairs-Number of Steps: 1 flight Alternate Level Stairs-Rails: Right Bathroom Shower/Tub: Multimedia programmer: Standard Bathroom Accessibility: Yes Home Equipment: None Additional Comments: All information taken from chart from one month ago, due to currently pt is inconsistent with answers or even answering questions. In speaking to RN she reports pt is currenlty separated from husband and sister [name] should be primary contact person with all medical information and sister is trying to get guardianship of pt's son 40 yo).   Functional History: Prior Function Prior Level of Function : Independent/Modified  Independent Mobility Comments: Does not drive, no IADLs.  SCAT to dialysis ADLs Comments: Independent with ADLs/selfcare  Functional Status:  Mobility: Bed Mobility Overal bed mobility: Needs Assistance Bed Mobility: Supine to Sit Rolling: Max assist, +2 for physical assistance, +2 for safety/equipment Sidelying to sit: HOB elevated, Max assist, +2 for physical assistance, +2 for safety/equipment Supine to sit: Max assist, +2 for physical assistance, +2 for safety/equipment, HOB elevated Sit to supine: Mod assist, Max assist Sit to sidelying: Max assist, HOB elevated General bed mobility comments: Mod assist to roll with multimodal cues to reach for bed rail. assist to bring Rt/Lt LE over to complete roll both directions. Mod-Max assist to press trunk upright to sitting EOB. pt unable to scoot at EOB to move anterior or lateral. pt returned to supine wiht cue to move to Lt forearm then to shoulder and mod-max assist to bring LE's onto bed and roll supine. +2 to boost in bed. Transfers Overall transfer level: Needs assistance Equipment used: Rolling walker (2 wheels) Transfers: Sit to/from Stand Sit to Stand: Total assist, From elevated surface, +2 safety/equipment, +2 physical assistance (unable to clear surface) Bed to/from chair/wheelchair/BSC transfer type:: Lateral/scoot transfer Stand pivot transfers: Max assist, +2 physical assistance, +2 safety/equipment, Total assist General transfer comment: Pt reaching out for RW when placed infront of her. Attempted to power up from elevated EOB with RW and +2 Max/Total assist. pt unable to rise with minimal initiation through bil UE and Lt LE. Pt able to lean trunk anterior and use head hip relationship to lean Rt to move Lt through lateral scoots. +2 Max assist with bed pad to complete 3x lateral scoot to move bed>recliner. Lift pad under pt for RN to transfer back after ~45 minutes OOB. Ambulation/Gait General Gait Details: Unable     ADL: ADL Overall ADL's : Needs assistance/impaired Eating/Feeding: Total assistance, Bed level Grooming: Wash/dry hands, Wash/dry face, Maximal assistance, Bed level Grooming Details (indicate cue type and reason): needs supported seating. max A for throughness. able to reach face with washcloth. holds washcloth with loose grip. Upper Body Bathing: Maximal assistance, Bed level Lower Body Bathing: Total assistance, +2 for physical assistance, +2 for safety/equipment, Bed level Upper Body Dressing : Maximal assistance, Bed level Lower Body Dressing: Total assistance, +2 for physical assistance, +2 for safety/equipment, Bed level Toileting- Clothing Manipulation and Hygiene: Total assistance, Bed level General ADL Comments: Pt completed sitting at EOB but requiring max assist intialy then with increase in positioning of BUE and using R base of bed able to increase posture with CGA  Cognition: Cognition Overall Cognitive Status: Impaired/Different from baseline Orientation Level: Oriented to place, Oriented to time, Oriented to situation Cognition Arousal/Alertness: Awake/alert Behavior During Therapy: Flat affect, Impulsive Overall Cognitive Status: Impaired/Different from baseline Area of Impairment: Orientation, Attention,  Following commands, Safety/judgement, Awareness, Problem solving, Memory Orientation Level: Disoriented to, Time, Situation Current Attention Level: Focused Memory: Decreased recall of precautions, Decreased short-term memory Following Commands: Follows one step commands inconsistently, Follows one step commands with increased time Safety/Judgement: Decreased awareness of safety, Decreased awareness of deficits Awareness: Intellectual Problem Solving: Slow processing, Decreased initiation, Difficulty sequencing, Requires verbal cues, Requires tactile cues General Comments: Pt became more alert once sitting up in recliner and making needs known more clearly. pt  reporting soreness and need for pillows under bottom sitting. When therapist cued to weight shift lateral pt followed commands more immediately and accurately. Difficult to assess due to: Level of arousal  Physical Exam: Blood pressure 109/63, pulse 100, temperature 98 F (36.7 C), temperature source Oral, resp. rate 14, height '5\' 1"'  (1.549 m), weight 57.1 kg, SpO2 98 %. Physical Exam  Results for orders placed or performed during the hospital encounter of 09/15/21 (from the past 48 hour(s))  Glucose, capillary     Status: Abnormal   Collection Time: 10/03/21 11:48 AM  Result Value Ref Range   Glucose-Capillary 63 (L) 70 - 99 mg/dL    Comment: Glucose reference range applies only to samples taken after fasting for at least 8 hours.  Glucose, capillary     Status: None   Collection Time: 10/03/21  4:55 PM  Result Value Ref Range   Glucose-Capillary 99 70 - 99 mg/dL    Comment: Glucose reference range applies only to samples taken after fasting for at least 8 hours.  Glucose, capillary     Status: Abnormal   Collection Time: 10/03/21  8:11 PM  Result Value Ref Range   Glucose-Capillary 172 (H) 70 - 99 mg/dL    Comment: Glucose reference range applies only to samples taken after fasting for at least 8 hours.  Glucose, capillary     Status: Abnormal   Collection Time: 10/03/21  8:22 PM  Result Value Ref Range   Glucose-Capillary 153 (H) 70 - 99 mg/dL    Comment: Glucose reference range applies only to samples taken after fasting for at least 8 hours.  Magnesium     Status: None   Collection Time: 10/03/21  9:25 PM  Result Value Ref Range   Magnesium 2.3 1.7 - 2.4 mg/dL    Comment: Performed at [facility], [place] [address] [place], [place] [address]  Phosphorus     Status: Abnormal   Collection Time: 10/03/21  9:25 PM  Result Value Ref Range   Phosphorus 5.8 (H) 2.5 - 4.6 mg/dL    Comment: Performed at [facility] [address] [place], [place] [address]  Glucose,  capillary     Status: Abnormal   Collection Time: 10/03/21 11:42 PM  Result Value Ref Range   Glucose-Capillary 215 (H) 70 - 99 mg/dL    Comment: Glucose reference range applies only to samples taken after fasting for at least 8 hours.  Glucose, capillary     Status: Abnormal   Collection Time: 10/04/21  4:46 AM  Result Value Ref Range   Glucose-Capillary 252 (H) 70 - 99 mg/dL    Comment: Glucose reference range applies only to samples taken after fasting for at least 8 hours.  Magnesium     Status: None   Collection Time: 10/04/21  5:03 AM  Result Value Ref Range   Magnesium 2.4 1.7 - 2.4 mg/dL    Comment: Performed at [facility] [address] [place], [place]  [address]  Phosphorus     Status: None   Collection Time: 10/04/21  5:03 AM  Result Value Ref Range   Phosphorus 4.6 2.5 - 4.6 mg/dL    Comment: Performed at [facility] [address], [place], [place] [address]  Glucose, capillary     Status: Abnormal   Collection Time: 10/04/21  8:38 AM  Result Value Ref Range   Glucose-Capillary 270 (H) 70 - 99 mg/dL    Comment: Glucose reference range applies only to samples taken after fasting for at least 8 hours.  Glucose, capillary     Status: Abnormal   Collection Time: 10/04/21 12:17 PM  Result Value Ref Range   Glucose-Capillary 238 (H) 70 - 99 mg/dL    Comment: Glucose reference range applies only to samples taken after fasting for at least 8 hours.  Glucose, capillary     Status: Abnormal   Collection Time: 10/04/21  3:55 PM  Result Value Ref Range   Glucose-Capillary 195 (H) 70 - 99 mg/dL    Comment: Glucose reference range applies only to samples taken after fasting for at least 8 hours.  Magnesium     Status: None   Collection Time: 10/04/21  7:51 PM  Result Value Ref Range   Magnesium 2.3 1.7 - 2.4 mg/dL    Comment: Performed at [facility], [place] [address], [place], [place] [address]  Phosphorus     Status: Abnormal   Collection Time:  10/04/21  7:51 PM  Result Value Ref Range   Phosphorus 4.9 (H) 2.5 - 4.6 mg/dL    Comment: Performed at [facility] [address] [place], [place] [address]  Glucose, capillary     Status: Abnormal   Collection Time: 10/04/21 11:58 PM  Result Value Ref Range   Glucose-Capillary 236 (H) 70 - 99 mg/dL    Comment: Glucose reference range applies only to samples taken after fasting for at least 8 hours.  Glucose, capillary     Status: Abnormal   Collection Time: 10/05/21  4:15 AM  Result Value Ref Range   Glucose-Capillary 257 (H) 70 - 99 mg/dL    Comment: Glucose reference range applies only to samples taken after fasting for at least 8 hours.  Magnesium     Status: Abnormal   Collection Time: 10/05/21  6:44 AM  Result Value Ref Range   Magnesium 2.5 (H) 1.7 - 2.4 mg/dL    Comment: Performed at [facility] [address], [place], [place] [address]  Phosphorus     Status: Abnormal   Collection Time: 10/05/21  6:44 AM  Result Value Ref Range   Phosphorus 5.0 (H) 2.5 - 4.6 mg/dL    Comment: Performed at [facility] [address] [place], [place] [address]  CBC     Status: Abnormal   Collection Time: 10/05/21  8:07 AM  Result Value Ref Range   WBC 10.5 4.0 - 10.5 K/uL   RBC 2.99 (L) 3.87 - 5.11 MIL/uL   Hemoglobin 8.6 (L) 12.0 - 15.0 g/dL   HCT 29.0 (L) 36.0 - 46.0 %   MCV 97.0 80.0 - 100.0 fL   MCH 28.8 26.0 - 34.0 pg   MCHC 29.7 (L) 30.0 - 36.0 g/dL   RDW 25.9 (H) 11.5 - 15.5 %   Platelets 268 150 - 400 K/uL   nRBC 0.4 (H) 0.0 - 0.2 %    Comment: Performed at [facility],  [address] [place], [place] [address]  Renal function panel     Status: Abnormal   Collection Time: 10/05/21  8:07 AM  Result Value Ref Range   Sodium 133 (L) 135 - 145 mmol/L   Potassium 5.2 (H) 3.5 - 5.1 mmol/L   Chloride 93 (L) 98 - 111 mmol/L   CO2 28 22 - 32 mmol/L   Glucose, Bld 210 (H) 70 - 99 mg/dL    Comment: Glucose reference range applies only to samples  taken after fasting for at least 8 hours.   BUN 61 (H) 6 - 20 mg/dL   Creatinine, Ser 7.23 (H) 0.44 - 1.00 mg/dL   Calcium 9.2 8.9 - 10.3 mg/dL   Phosphorus 4.7 (H) 2.5 - 4.6 mg/dL   Albumin 2.1 (L) 3.5 - 5.0 g/dL   GFR, Estimated 7 (L) >60 mL/min    Comment: (NOTE) Calculated using the CKD-EPI Creatinine Equation (2021)    Anion gap 12 5 - 15    Comment: Performed at [facility] [address] [place], [place] [address]  Glucose, capillary     Status: Abnormal   Collection Time: 10/05/21 10:07 AM  Result Value Ref Range   Glucose-Capillary 146 (H) 70 - 99 mg/dL    Comment: Glucose reference range applies only to samples taken after fasting for at least 8 hours.   No results found.    Blood pressure 109/63, pulse 100, temperature 98 F (36.7 C), temperature source Oral, resp. rate 14, height '5\' 1"'  (1.549 m), weight 57.1 kg, SpO2 98 %.  Medical Problem List and Plan: 1. Functional deficits secondary to acute bilateral cerebral hemisphere punctate infarcts, recent right BKA.  -patient may *** shower  -ELOS/Goals: *** 2.  Antithrombotics: -DVT/anticoagulation:  Pharmaceutical: Heparin  -antiplatelet therapy: Plavix and aspirin for three weeks then aspirin alone 3. Pain Management: Tylenol 4. Mood: LCSW to evaluate and provide supportive care  -antipsychotic agents: n/a 5. Neuropsych: This patient is capable of making decisions on her own behalf. 6. Skin/Wound Care: Routine skin care checks.  --monitor surgical incision (staples out in 4 weeks) 7. Fluids/Electrolytes/Nutrition: Strict I's and O's.  Follow-up chemistries as per nephrology 8. ESRD on HD: T/T/S schedule via right AV fistula 9. DM-2, uncontrolled:  --Semglee 16 units q HS  --SSI TID 10: Diabetic foot ulcer s/p right BKA on 2/5 by Dr. Trula Slade 11: Bilateral ptosis; continue Cosopt eye drops 12: Anemia of chronic kidney disease: Continue Aranesp.  Follow CBC 13: Right BKA 2/5: Continue shrinker stocking  and limb protector.   --Monitor incision.  Staples out approximately 3/3 per vascular surgery 14: Hypotension: Intermittent and resolved with midodrine 15: Moderate protein malnutrition: Continue Nepro 16: Embolic stroke: Dual antiplatelet therapy.  Small PFO.   --Consider 30-day cardiac event monitoring as outpatient 17: Hyperlipidemia: Atorvastatin 40 mg      ***  Barbie Banner, PA-C 10/05/2021

## 2021-10-05 NOTE — Progress Notes (Signed)
Physical Therapy Treatment Patient Details Name: [name] MRN: [id number] DOB: 09-01-1980 Today's Date: 10/05/2021   History of Present Illness Pt is a 41 y.o. female admitted 09/15/21 post-HD session with concern for AMS when pt stopped speaking to staff. 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 Pt with increased somnolence 2/6; noted to be more lethargic in HD 2/7, 2/9, 2/11. Head CT 2/10 negative for acute injury; chronic small vessel disease and infarcts. MRI 2/10 with scattered punctate acute infarcts bilateral cerebral hemispheres (may be embolic), potential subacute infarct L corona radiata. Pt with persistent bilateral ptosis (pt reports ongoing ~1 month); awaiting MRI of orbits on 2/25. PMH includes ESRD (HD TTS), DM2, HTN, neuropathy, R eye blindness (2008).   PT Comments    Pt progressing with mobility. Today's session focused on engaging pt in performing mobility/ADL tasks as independently as possible. When asked why keeping eyes closed, pt reports, "I can open them, I don't want to stare at you..." later pt reports, "I can't do that, I can't see." Difficult to determine extent of true cognitive impairment vs fatigue/lethargy vs desire to participate, although pt reports motivation to regain independence and d/c to CIR. Pt remains limited by generalized weakness, decreased activity tolerance, poor balance strategies/postural reactions and impaired cognition. Continue to recommend post-acute rehab to maximize functional mobility and independence prior to return home.    Recommendations for follow up therapy are one component of a multi-disciplinary discharge planning process, led by the attending physician.  Recommendations may be updated based on patient status, additional functional criteria and insurance authorization.  Follow Up Recommendations  Acute inpatient rehab (3hours/day)     Assistance Recommended at Discharge Frequent or constant  Supervision/Assistance  Patient can return home with the following Two people to help with walking and/or transfers;Assistance with cooking/housework;Assistance with feeding;Direct supervision/assist for medications management;Direct supervision/assist for financial management;Assist for transportation;Help with stairs or ramp for entrance;A lot of help with bathing/dressing/bathroom   Equipment Recommendations  BSC/3in1;Wheelchair (measurements PT);Wheelchair cushion (measurements PT);Hospital bed;Lift equipment    Recommendations for Other Services       Precautions / Restrictions Precautions Precautions: Fall;Other (comment) Precaution Comments: Bowel incontience; per chart, h/o R eye blindness - pt not opening eyes at all during session 2/23 Required Braces or Orthoses: Other Brace Other Brace: RLE limb guard     Mobility  Bed Mobility Overal bed mobility: Needs Assistance Bed Mobility: Rolling, Supine to Sit Rolling: Min assist, Mod assist Sidelying to sit: Mod assist, HOB elevated       General bed mobility comments: ModA for HHA to come to long sitting and prevent posterior LOB, mod-maxA to scoot hips to EOB with repeated, multimodal cues for sequencing and hand placement; pt consistently requiring encouragement to perform tasks as independently as possible, stating "I can't" multiple times. Encouraged return to supine without assist, pt opting to lay straight backwards sideways across bed, max verbal cues for sequencing and encouragement to reposition self. Multiple rolls R/L for pericare and linen change, pt ultimately able to perform with minA to fully rotate hips, use of bed rail    Transfers Overall transfer level: Needs assistance Equipment used: None              Lateral/Scoot Transfers: Max assist General transfer comment: multiple lateral scoots along EOB with maxA for scooting hips; multimodal cues for head/hips relationship in order to scoot  forwards/backwards/sideways; repeated cues for initiation and sequencing    Ambulation/Gait  Stairs             Wheelchair Mobility    Modified Rankin (Stroke Patients Only)       Balance Overall balance assessment: Needs assistance Sitting-balance support: No upper extremity supported, Feet supported, Feet unsupported Sitting balance-Leahy Scale: Fair Sitting balance - Comments: progressing to min guard for static sitting EOB without UE support, though pt with preference for rail support; unable to accept challenge to balance                                    Cognition Arousal/Alertness: Lethargic Behavior During Therapy: Flat affect Overall Cognitive Status: No family/caregiver present to determine baseline cognitive functioning Area of Impairment: Orientation, Attention, Following commands, Safety/judgement, Awareness, Problem solving, Memory                   Current Attention Level: Focused Memory: Decreased recall of precautions, Decreased short-term memory Following Commands: Follows one step commands inconsistently, Follows one step commands with increased time Safety/Judgement: Decreased awareness of safety, Decreased awareness of deficits Awareness: Intellectual Problem Solving: Slow processing, Decreased initiation, Difficulty sequencing, Requires verbal cues, Requires tactile cues General Comments: inconsistent cognitive presentation - difficult to determine true cognitive impairment vs. fatigue/lethargy vs. desire to participate/learned helplesness(?) - although pt reports she wants to regain strength and get home. very flat affect, when initially asked to open eyes pt reports "I can open them, I just don't want to stare at you" but later when asked to engage in doffing limb guard, pt reports "I can't see." Pt frequently reports, "I can't... it's too hard" at times crying, requiring frequent, max encouragement to  perform tasks as independently as possible        Exercises      General Comments General comments (skin integrity, edema, etc.): HR to 100s with sitting EOB activity. Donned/doffed R residual limb shrinker and limb guard, pt required maxA for this. Pt with bowel incontinence, dependent for pericare. Pt becoming upset/tearful when encouraged to don lotion herself, ultimately requiring assist to reach her back      Pertinent Vitals/Pain Pain Assessment Pain Assessment: Faces Faces Pain Scale: Hurts a little bit Pain Location: bottom with pericare Pain Descriptors / Indicators: Guarding, Discomfort Pain Intervention(s): Monitored during session, Limited activity within patient's tolerance    Home Living                          Prior Function            PT Goals (current goals can now be found in the care plan section) Acute Rehab PT Goals Patient Stated Goal: to get to rehab Progress towards PT goals: Progressing toward goals    Frequency    Min 4X/week      PT Plan Current plan remains appropriate    Co-evaluation              AM-PAC PT "6 Clicks" Mobility   Outcome Measure  Help needed turning from your back to your side while in a flat bed without using bedrails?: A Lot Help needed moving from lying on your back to sitting on the side of a flat bed without using bedrails?: A Lot Help needed moving to and from a bed to a chair (including a wheelchair)?: Total Help needed standing up from a chair using your arms (e.g., wheelchair or bedside chair)?:  Total Help needed to walk in hospital room?: Total Help needed climbing 3-5 steps with a railing? : Total 6 Click Score: 8    End of Session   Activity Tolerance: Patient tolerated treatment well;Patient limited by fatigue;Patient limited by lethargy Patient left: in bed;with call bell/phone within reach;with bed alarm set Nurse Communication: Mobility status;Other (comment) (incontinence, buttocks  wound pad soiled) PT Visit Diagnosis: Other abnormalities of gait and mobility (R26.89);Muscle weakness (generalized) (M62.81);Difficulty in walking, not elsewhere classified (R26.2);Other symptoms and signs involving the nervous system (R29.898)     Time: 2903-7955 PT Time Calculation (min) (ACUTE ONLY): 36 min  Charges:  $Therapeutic Activity: 23-37 mins                     Mabeline Caras, PT, DPT Acute Rehabilitation Services  Pager [phone] Office [place] 10/05/2021, 3:44 PM

## 2021-10-06 DIAGNOSIS — M869 Osteomyelitis, unspecified: Secondary | ICD-10-CM | POA: Diagnosis not present

## 2021-10-06 LAB — GLUCOSE, CAPILLARY
Glucose-Capillary: 137 mg/dL — ABNORMAL HIGH (ref 70–99)
Glucose-Capillary: 151 mg/dL — ABNORMAL HIGH (ref 70–99)
Glucose-Capillary: 182 mg/dL — ABNORMAL HIGH (ref 70–99)
Glucose-Capillary: 191 mg/dL — ABNORMAL HIGH (ref 70–99)
Glucose-Capillary: 221 mg/dL — ABNORMAL HIGH (ref 70–99)
Glucose-Capillary: 238 mg/dL — ABNORMAL HIGH (ref 70–99)

## 2021-10-06 NOTE — Progress Notes (Signed)
Speech Language Pathology Treatment: Dysphagia  Patient Details Name: [name] MRN: [id number] DOB: 19-Sep-1980 Today's Date: 10/06/2021 Time: 9794-8016 SLP Time Calculation (min) (ACUTE ONLY): 15 min  Assessment / Plan / Recommendation Clinical Impression  Pt seen at bedside to assess tolerance of Dys2/thin liquid diet, and to evaluate readiness to advance solid textures. Pt was sleepy/lethargic upon arrival of SLP. RN reports pt's alertness continues to be highly variable. Pt was willing to accept PO trials, but did not allow SLP to position her fully upright. SLP educated pt regarding the importance of upright position during all PO intake. Pt accepted trials of graham cracker dipped in applesauce, and thin liquids. She would not accept a plain cracker, raising concern that that texture (mech soft) would not be well tolerated at this time. One cough noted after initial presentation of cracker/puree, but no other overt s/s aspiration observed during remaining PO trials. Will continue Dys2 diet and thin liquids at this time. RN reports pt has been tolerating her PO meds whole with puree. SLP will continue to follow per plan of treatment.   HPI HPI: 41 y.o. female presented to ED 2/3 with AMS, gangrene RLE and osteomyelitis.  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 Further change in mentation 2/10; MRI brain showed scattered punctate acute infarcts throughout both cerebral hemispheres. Pt was on renal diet with 1200 ml fluid restriction prior to CVA, made NPO 2/11 and large bore NG was placed. 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 Palliative care has been consulted in the setting of multiple acute on chronic disease processes to further discuss goals of care. Swallow eval ordered 2/13.      SLP Plan  Continue with current plan of care      Recommendations  for follow up therapy are one component of a multi-disciplinary discharge planning process, led by the attending physician.  Recommendations may be updated based on patient status, additional functional criteria and insurance authorization.    Recommendations  Diet recommendations: Dysphagia 2 (fine chop);Thin liquid Liquids provided via: Cup;Straw Medication Administration: Whole meds with puree Supervision: Full supervision/cueing for compensatory strategies Compensations: Minimize environmental distractions;Slow rate;Small sips/bites;Follow solids with liquid Postural Changes and/or Swallow Maneuvers: Seated upright 90 degrees;Upright 30-60 min after meal                Oral Care Recommendations: Oral care QID Follow Up Recommendations: Acute inpatient rehab (3hours/day) Assistance recommended at discharge: Frequent or constant Supervision/Assistance SLP Visit Diagnosis: Dysphagia, unspecified (R13.10);Dysphagia, oral phase (R13.11) Plan: Continue with current plan of care         Leland Staszewski B. Quentin Ore, [facility], [place] Speech Language Pathologist Office: [phone]  Shonna Chock  10/06/2021, 1:20 PM

## 2021-10-06 NOTE — Care Management Important Message (Signed)
Important Message  Patient Details  Name: [name] MRN: [id number] Date of Birth: 05/10/1981   Medicare Important Message Given:  Yes     Hannah Beat 10/06/2021, 2:03 PM

## 2021-10-06 NOTE — Progress Notes (Signed)
[facility] Progress Note   Subjective:   Patient seen and examined at bedside.  Sister [name] is giving her a massage.  Feeling pretty good right now.  Denies CP, SOB, abdominal pain, and n/v/d.    Objective Vitals:   10/05/21 1519 10/05/21 1538 10/05/21 2017 10/06/21 1212  BP: (!) 91/27 123/69 (!) 125/52 (!) 143/67  Pulse: 92  96 95  Resp:   16 18  Temp: 98.2 F (36.8 C)  99 F (37.2 C) 98.5 F (36.9 C)  TempSrc: Oral  Oral   SpO2: 93%  95% 100%  Weight:      Height:       Physical Exam General:Chronically ill appearing female in NAD. NG tube in place. Heart:RRR, no mrg Lungs:CTAB, nml WOB on RA Abdomen:soft, NTND Extremities:no LE edema, R BKA Dialysis Access: R AVF +thrill  Filed Weights   10/03/21 1057 10/05/21 0734 10/05/21 0930  Weight: 56.8 kg 57.6 kg 57.1 kg    Intake/Output Summary (Last 24 hours) at 10/06/2021 1230 Last data filed at 10/05/2021 1741 Gross per 24 hour  Intake 200 ml  Output --  Net 200 ml    Additional Objective Labs: Basic Metabolic Panel: Recent Labs  Lab 09/30/21 0838 10/03/21 0804 10/03/21 2125 10/04/21 1951 10/05/21 0644 10/05/21 0807  NA 133* 128*  --   --   --  133*  K 3.7 5.0  --   --   --  5.2*  CL 93* 90*  --   --   --  93*  CO2 25 24  --   --   --  28  GLUCOSE 235* 100*  --   --   --  210*  BUN 46* 65*  --   --   --  61*  CREATININE 6.88* 7.90*  --   --   --  7.23*  CALCIUM 8.3* 9.0  --   --   --  9.2  PHOS 4.8*  --    < > 4.9* 5.0* 4.7*   < > = values in this interval not displayed.   Liver Function Tests: Recent Labs  Lab 09/30/21 0838 10/03/21 0804 10/05/21 0807  AST  --  24  --   ALT  --  15  --   ALKPHOS  --  92  --   BILITOT  --  0.6  --   PROT  --  7.2  --   ALBUMIN 1.8* 2.0* 2.1*   CBC: Recent Labs  Lab 09/30/21 0837 10/03/21 0804 10/05/21 0807  WBC 8.7 11.8* 10.5  HGB 8.2* 8.8* 8.6*  HCT 28.4* 29.1* 29.0*  MCV 97.3 96.0 97.0  PLT 243 290 268   Blood Culture     Component Value Date/Time   SDES BLOOD LEFT HAND 09/22/2021 1554   SPECREQUEST  09/22/2021 1554    BOTTLES DRAWN AEROBIC AND ANAEROBIC Blood Culture results may not be optimal due to an inadequate volume of blood received in culture bottles   CULT  09/22/2021 1554    NO GROWTH 5 DAYS Performed at [facility], [place] [address], [place], [place] [address]    REPTSTATUS 09/27/2021 FINAL 09/22/2021 1554    CBG: Recent Labs  Lab 10/05/21 2016 10/05/21 2347 10/06/21 0416 10/06/21 0750 10/06/21 1147  GLUCAP 159* 172* 221* 238* 191*   Iron Studies: No results for input(s): IRON, TIBC, TRANSFERRIN, FERRITIN in the last 72 hours. Lab Results  Component Value Date   INR 1.2 08/10/2021  INR 1.1 06/30/2020   Studies/Results: No results found.  Medications:   aspirin EC  81 mg Oral Daily   atorvastatin  40 mg Oral Daily   Chlorhexidine Gluconate Cloth  6 each Topical Q0600   cinacalcet  90 mg Oral Q supper   clopidogrel  75 mg Oral Daily   darbepoetin (ARANESP) injection - DIALYSIS  200 mcg Intravenous Q Thu-HD   dorzolamide-timolol  1 drop Left Eye BID   feeding supplement (NEPRO CARB STEADY)  780 mL Per Tube Q24H   Gerhardt's butt cream   Topical TID   heparin injection (subcutaneous)  5,000 Units Subcutaneous Q8H   insulin aspart  0-6 Units Subcutaneous TID WC   insulin glargine-yfgn  16 Units Subcutaneous QHS   lidocaine-prilocaine  1 application Topical UD   mouth rinse  15 mL Mouth Rinse BID   multivitamin  1 tablet Oral QHS   nutrition supplement (JUVEN)  1 packet Per Tube BID BM   pantoprazole sodium  40 mg Oral Daily   sevelamer carbonate  1.6 g Oral TID WC    Dialysis Orders: [facility] 3h 65min   400/ 600   59.5kg   2/2 bath  Prof 2   RU AV fistula  Hep 4000  Mircera 200 mcg IV every 2 weeks - last 1/26 Ancef 2g 3 times weekly HD Covid neg 2/3, hep B SAg neg 2/4   Assessment/Plan: AMS - acute embolic CVA and/or watershed infarcts. With PFO on TTE. TEE  showed small PFO and no clot. Blood Cx 2/4 negative. S/p course of IV abx.  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 Plan for MRI of orbits/sinuses tomorrow to better determine etiology. May need LP. 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  [facility] following.  To go to CIR at some point.  ESRD - Usually TTS schedule - HD tomorrow. Trying to keep SBP > 120 on HD due to #1.  Sepsis/ shock - resolved  Anemia of CKD- Hgb 8.6, continue Aranesp 249mcg q Thurs. Transfuse pRBC prn MBD: CorrCa slightly high, Phos ok. Continue home sensipar/binders, hold VDRA for now. HTN/volume - midodrine with HD. UF as tolerated w/ albumin support prn. Below prior EDW, will need to be lowered on d/c. Severe protein calorie malnutrition: NG tube in place with Nepro TF's + supplements. DMT2 - per PMD  Dispo - likely will need SNF placement.  Jen Mow, PA-C [facility] 10/06/2021,12:30 PM  LOS: 21 days

## 2021-10-06 NOTE — Progress Notes (Addendum)
°  Progress Note    10/06/2021 11:04 AM 4 Days Post-Op  Subjective:  no complaints   Vitals:   10/05/21 1538 10/05/21 2017  BP: 123/69 (!) 125/52  Pulse:  96  Resp:  16  Temp:  99 F (37.2 C)  SpO2:  95%   Physical Exam: Cardiac:  regular Lungs:  non labored Extremities:  Right BKA with staples intact. Skin starting to slough with some necrosis along staple line     Neurologic: alert and oriented  CBC    Component Value Date/Time   WBC 10.5 10/05/2021 0807   RBC 2.99 (L) 10/05/2021 0807   HGB 8.6 (L) 10/05/2021 0807   HGB 10.6 (L) 05/18/2020 0927   HCT 29.0 (L) 10/05/2021 0807   HCT 35.1 05/18/2020 0927   PLT 268 10/05/2021 0807   PLT 186 05/18/2020 0927   MCV 97.0 10/05/2021 0807   MCV 89 05/18/2020 0927   MCH 28.8 10/05/2021 0807   MCHC 29.7 (L) 10/05/2021 0807   RDW 25.9 (H) 10/05/2021 0807   RDW 13.9 05/18/2020 0927   LYMPHSABS 1.9 09/27/2021 0129   LYMPHSABS 1.6 05/18/2020 0927   MONOABS 1.3 (H) 09/27/2021 0129   EOSABS 0.4 09/27/2021 0129   EOSABS 0.6 (H) 05/18/2020 0927   BASOSABS 0.1 09/27/2021 0129   BASOSABS 0.0 05/18/2020 0927    BMET    Component Value Date/Time   NA 133 (L) 10/05/2021 0807   NA 133 (L) 05/18/2020 0927   K 5.2 (H) 10/05/2021 0807   CL 93 (L) 10/05/2021 0807   CO2 28 10/05/2021 0807   GLUCOSE 210 (H) 10/05/2021 0807   BUN 61 (H) 10/05/2021 0807   BUN 45 (H) 05/18/2020 0927   CREATININE 7.23 (H) 10/05/2021 0807   CALCIUM 9.2 10/05/2021 0807   GFRNONAA 7 (L) 10/05/2021 0807   GFRAA 6 (L) 05/18/2020 0927    INR    Component Value Date/Time   INR 1.2 08/10/2021 2212     Intake/Output Summary (Last 24 hours) at 10/06/2021 1104 Last data filed at 10/05/2021 1741 Gross per 24 hour  Intake 200 ml  Output --  Net 200 ml     Assessment/Plan:  41 y.o. female is s/p right BKA 19 Days Post Op   -pt's right BKA stump was looking more stable two days ago. Skin now starting to slough off. Some necrosis along staple  line. It is warm to touch. No drainage. Presently no indication for revision vs more proximal amputation - Pending Dispo to CIR - Will continue to follow  [name], PA-C [facility] [phone] 10/06/2021 11:04 AM  I agree with the above.  I have seen and evaluated the patient.  She is much more alert today, eating and talking.  The discoloration of the skin on her below-knee amputation stump is concerning, however the staple line remains intact without drainage.  I reiterated that she is at risk for needing this converted to an above-knee amputation, however doing so is not absolute at this time, so she should continue with her current care with plans to transfer to rehab.  We will follow her in rehab.  [name]

## 2021-10-06 NOTE — Progress Notes (Signed)
Occupational Therapy Treatment Patient Details Name: [name] MRN: [id number] DOB: 08-Mar-1981 Today's Date: 10/06/2021   History of present illness Pt is a 41 y.o. female admitted 09/15/21 post-HD session with concern for AMS when pt stopped speaking to staff. 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 Pt with increased somnolence 2/6; noted to be more lethargic in HD 2/7, 2/9, 2/11. Head CT 2/10 negative for acute injury; chronic small vessel disease and infarcts. MRI 2/10 with scattered punctate acute infarcts bilateral cerebral hemispheres (may be embolic), potential subacute infarct L corona radiata. Pt with persistent bilateral ptosis (pt reports ongoing ~1 month); awaiting MRI of orbits on 2/25. PMH includes ESRD (HD TTS), DM2, HTN, neuropathy, R eye blindness (2008).   OT comments  Pt seen in conjunction with PT.  Pt was able to move to EOB with mod A and step by step instruction.  She initially required min A for EOB sitting progressing to min guard assist.  She performed simple grooming task with min A while EOB.  She attempted sit to stand x 3 with max A +2 and was able to lift buttocks 1-2" from bed.  Pt seemed to do well with step by step instruction and encouragement to use tactile input to feel her environment and to learn her therapists due to visual impairment.  She also seemed to do better with therapists serving as boundaries for her to provide confidence that she would not loose her balance and subsequently fall.  C/o pain seemed to increase when anxiety and fear increased.  Will continue to follow.    Recommendations for follow up therapy are one component of a multi-disciplinary discharge planning process, led by the attending physician.  Recommendations may be updated based on patient status, additional functional criteria and insurance authorization.    Follow Up Recommendations  Acute inpatient rehab (3hours/day)    Assistance  Recommended at Discharge Frequent or constant Supervision/Assistance  Patient can return home with the following  A lot of help with walking and/or transfers;A lot of help with bathing/dressing/bathroom;Assistance with cooking/housework;Assistance with feeding;Direct supervision/assist for medications management;Direct supervision/assist for financial management;Assist for transportation   Equipment Recommendations  None recommended by OT    Recommendations for Other Services      Precautions / Restrictions Precautions Precautions: Fall Precaution Comments: Rt BKA, new bil ptosis with blindness, bowel incontinence Required Braces or Orthoses: Other Brace Other Brace: RLE limb guard       Mobility Bed Mobility Overal bed mobility: Needs Assistance Bed Mobility: Rolling, Sidelying to Sit, Sit to Sidelying Rolling: Min assist Sidelying to sit: Mod assist     Sit to sidelying: Mod assist General bed mobility comments: pt requires step by step instruction to negotiate the bed and how to move in it as she has poor spatial awareness of the boundaries of the bed and how to navigate siting and lying down.  She was able to assist with scooting toward [facility] and with rolling using bedrails    Transfers                   General transfer comment: with pt sitting EOB and hands on therapits knees (therapist sitting in front of her), pt able to initiate sit to partial (small excursion) to stand with max A +2 - she was able to lift buttocks 1-2 inches each time x 3 attempts before she fatigued.   On last attempt, bed height was elevated  Balance Overall balance assessment: Needs assistance Sitting-balance support: Feet supported, Bilateral upper extremity supported Sitting balance-Leahy Scale: Poor Sitting balance - Comments: pt requires UE support and min A progressing to min guard assist                                   ADL either performed or assessed with clinical  judgement   ADL Overall ADL's : Needs assistance/impaired     Grooming: Wash/dry hands;Wash/dry face;Minimal assistance;Sitting Grooming Details (indicate cue type and reason): with encouragement, pt was able to wash face thoroughly with min A for balance                                    Extremity/Trunk Assessment Upper Extremity Assessment Upper Extremity Assessment: Generalized weakness RUE Deficits / Details: edema noted upper arm   Lower Extremity Assessment Lower Extremity Assessment: Defer to PT evaluation        Vision   Additional Comments: pt with bil. pstosis and occulomotor impairment.  MRI of orbits pending.  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   Perception Perception Perception: Impaired   Praxis Praxis Praxis-Other Comments: difficult to accurately assess due to blindness and associated anxiety    Cognition Arousal/Alertness: Awake/alert Behavior During Therapy: Flat affect Overall Cognitive Status: Impaired/Different from baseline Area of Impairment: Attention, Following commands, Awareness, Problem solving                 Orientation Level:  (Pt oriented to Feb, 2023, Friday, [place], stroke) Current Attention Level: Sustained   Following Commands: Follows one step commands consistently   Awareness: Emergent Problem Solving: Slow processing, Difficulty sequencing, Requires verbal cues, Requires tactile cues General Comments: Cognition difficult to accurately assess due to new onset blindness, and associated anxiety which worsens when she starts to move.  When she felt safe, and knew what was expected, she was able to follow commands consistently, and was able to sustain attention to tasks        Exercises      Shoulder Instructions       General Comments Pt assisted with peri care in supine due to bowel incontinence. Pt left with gospel music playing    Pertinent Vitals/ Pain        Pain Assessment Pain Assessment: Faces Faces Pain Scale: Hurts little more Pain Location: buttocks and Rt LE with increase in c/o when she is scared Pain Descriptors / Indicators: Guarding, Restless Pain Intervention(s): Repositioned, Monitored during session  Home Living                                          Prior Functioning/Environment              Frequency  Min 2X/week        Progress Toward Goals  OT Goals(current goals can now be found in the care plan section)  Progress towards OT goals: Progressing toward goals     Plan Discharge plan remains appropriate    Co-evaluation    PT/OT/SLP Co-Evaluation/Treatment: Yes Reason for Co-Treatment: Complexity of the patient's impairments (multi-system involvement);For patient/therapist safety;To address functional/ADL transfers   OT goals addressed during session: ADL's and  self-care      AM-PAC OT "6 Clicks" Daily Activity     Outcome Measure   Help from another person eating meals?: Total Help from another person taking care of personal grooming?: A Little Help from another person toileting, which includes using toliet, bedpan, or urinal?: Total Help from another person bathing (including washing, rinsing, drying)?: A Lot Help from another person to put on and taking off regular upper body clothing?: Total Help from another person to put on and taking off regular lower body clothing?: Total 6 Click Score: 9    End of Session    OT Visit Diagnosis: Other abnormalities of gait and mobility (R26.89);Unsteadiness on feet (R26.81);Muscle weakness (generalized) (M62.81);Other symptoms and signs involving cognitive function;Low vision, both eyes (H54.2) Pain - part of body:  (buttocks and LE)   Activity Tolerance Patient tolerated treatment well   Patient Left in bed;with call bell/phone within reach;with bed alarm set   Nurse Communication Mobility status        Time: 8546-2703 OT  Time Calculation (min): 40 min  Charges: OT General Charges $OT Visit: 1 Visit OT Treatments $Neuromuscular Re-education: 23-37 mins  Nilsa Nutting OTR/L Acute Rehabilitation Services Pager [phone] Office [place], [place] 10/06/2021, 4:50 PM

## 2021-10-06 NOTE — Progress Notes (Signed)
Inpatient Rehab Admissions Coordinator:  Pt is not medically ready for CIR. Pt with pending medical workup. Will continue to follow.  Gayland Curry, [place], [place] Admissions Coordinator [phone]

## 2021-10-06 NOTE — TOC Progression Note (Addendum)
Transition of Care [facility]) - Progression Note    Patient Details  Name: [name] MRN: [id number] Date of Birth: April 25, 1981  Transition of [facility]) CM/SW Contact  Joanne Chars, LCSW Phone Number: 10/06/2021, 8:37 AM  Clinical Narrative:   Plan continues to be CIR pending further workup.  Pt has been sent out for SNF as well, currently has one bed offer.  TOC will continue to follow.     Expected Discharge Plan: Skilled Nursing Facility Barriers to Discharge: Continued Medical Work up  Expected Discharge Plan and Services Expected Discharge Plan: [place] In-house Referral: Clinical Social Work   Post Acute Care Choice: [place] Living arrangements for the past 2 months: Apartment                                       Social Determinants of Health (SDOH) Interventions    Readmission Risk Interventions Readmission Risk Prevention Plan 08/23/2021  Transportation Screening Complete  HRI or Home Care Consult Complete  Social Work Consult for [place] Planning/Counseling Complete  Palliative Care Screening Not Applicable  Medication Review Press photographer) Complete  Some recent data might be hidden

## 2021-10-06 NOTE — Progress Notes (Signed)
Physical Therapy Treatment Patient Details Name: [name] MRN: [id number] DOB: [birth year] Today's Date: 10/06/2021   History of Present Illness Pt is a 41 y.o. female admitted 09/15/21 post-HD session with concern for AMS when pt stopped speaking to staff. 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 Pt with increased somnolence 2/6; noted to be more lethargic in HD 2/7, 2/9, 2/11. Head CT 2/10 negative for acute injury; chronic small vessel disease and infarcts. MRI 2/10 with scattered punctate acute infarcts bilateral cerebral hemispheres (may be embolic), potential subacute infarct L corona radiata. Pt with persistent bilateral ptosis (pt reports ongoing ~1 month); awaiting MRI of orbits on 2/25. PMH includes ESRD (HD TTS), DM2, HTN, neuropathy, R eye blindness (2008).   PT Comments    Pt demonstrates improved participation and mobility progression with step-by-step instructions and tactile input due to visual impairments; endorses fear of losing balance while sitting EOB. Pt tolerating bed mobility with min-modA, seated balance activities and initiation of standing on LLE with maxA+2. Continue to recommend post-acute rehab services to maximize functional mobility and independence prior to return home.   Recommendations for follow up therapy are one component of a multi-disciplinary discharge planning process, led by the attending physician.  Recommendations may be updated based on patient status, additional functional criteria and insurance authorization.  Follow Up Recommendations  Acute inpatient rehab (3hours/day)     Assistance Recommended at Discharge Frequent or constant Supervision/Assistance  Patient can return home with the following Two people to help with walking and/or transfers;Assistance with cooking/housework;Assistance with feeding;Direct supervision/assist for medications management;Direct supervision/assist for financial  management;Assist for transportation;Help with stairs or ramp for entrance;A lot of help with bathing/dressing/bathroom   Equipment Recommendations  BSC/3in1;Wheelchair (measurements PT);Wheelchair cushion (measurements PT);Hospital bed;Other (comment) (lift equipment)    Recommendations for Other Services       Precautions / Restrictions Precautions Precautions: Fall;Other (comment) Precaution Comments: Rt BKA, new bil ptosis with blindness, bowel incontinence Required Braces or Orthoses: Other Brace Other Brace: RLE limb guard     Mobility  Bed Mobility Overal bed mobility: Needs Assistance Bed Mobility: Rolling, Sidelying to Sit, Sit to Sidelying Rolling: Min assist Sidelying to sit: Mod assist     Sit to sidelying: Mod assist General bed mobility comments: pt requires step by step instruction to negotiate the bed and how to move in it as she has poor spatial awareness of the boundaries of the bed and how to navigate siting and lying down.  She was able to assist with scooting toward [facility] and with rolling using bedrails    Transfers Overall transfer level: Needs assistance Equipment used: None Transfers: Sit to/from Stand             General transfer comment: with pt sitting EOB and hands on therapits knees (therapist sitting in front of her), pt able to initiate sit to partial (small excursion) to stand with max A +2 - she was able to lift buttocks 1-2 inches each time x 3 attempts before she fatigued.   On last attempt, bed height was elevated    Ambulation/Gait                   Stairs             Wheelchair Mobility    Modified Rankin (Stroke Patients Only)       Balance Overall balance assessment: Needs assistance Sitting-balance support: Feet supported, Bilateral upper extremity supported Sitting balance-Leahy Scale: Poor  Sitting balance - Comments: pt requires UE support and min A progressing to min guard assist                                     Cognition Arousal/Alertness: Awake/alert Behavior During Therapy: Flat affect Overall Cognitive Status: Impaired/Different from baseline Area of Impairment: Attention, Following commands, Awareness, Problem solving                 Orientation Level:  (Pt oriented to Feb, 2023, Friday, [place], stroke) Current Attention Level: Sustained   Following Commands: Follows one step commands consistently, Follows one step commands with increased time   Awareness: Emergent Problem Solving: Slow processing, Difficulty sequencing, Requires verbal cues, Requires tactile cues General Comments: Cognition difficult to accurately assess due to new onset blindness, and associated anxiety which worsens when she starts to move.  When she felt safe, and knew what was expected, she was able to follow commands consistently, and was able to sustain attention to tasks        Exercises      General Comments General comments (skin integrity, edema, etc.): Pt assisted with peri care in supine due to bowel incontinence. Pt left with gospel music playing      Pertinent Vitals/Pain Pain Assessment Pain Assessment: Faces Faces Pain Scale: Hurts little more Pain Location: buttocks and Rt LE with increase in c/o when she is scared Pain Descriptors / Indicators: Guarding, Restless Pain Intervention(s): Repositioned, Monitored during session    Home Living                          Prior Function            PT Goals (current goals can now be found in the care plan section) Progress towards PT goals: Progressing toward goals    Frequency    Min 4X/week      PT Plan Current plan remains appropriate    Co-evaluation PT/OT/SLP Co-Evaluation/Treatment: Yes Reason for Co-Treatment: Complexity of the patient's impairments (multi-system involvement);Necessary to address cognition/behavior during functional activity;For patient/therapist safety;To address  functional/ADL transfers PT goals addressed during session: Mobility/safety with mobility;Balance OT goals addressed during session: ADL's and self-care      AM-PAC PT "6 Clicks" Mobility   Outcome Measure  Help needed turning from your back to your side while in a flat bed without using bedrails?: A Little Help needed moving from lying on your back to sitting on the side of a flat bed without using bedrails?: A Lot Help needed moving to and from a bed to a chair (including a wheelchair)?: Total Help needed standing up from a chair using your arms (e.g., wheelchair or bedside chair)?: Total Help needed to walk in hospital room?: Total Help needed climbing 3-5 steps with a railing? : Total 6 Click Score: 9    End of Session   Activity Tolerance: Patient tolerated treatment well Patient left: in bed;with call bell/phone within reach;with bed alarm set Nurse Communication: Mobility status PT Visit Diagnosis: Other abnormalities of gait and mobility (R26.89);Muscle weakness (generalized) (M62.81);Difficulty in walking, not elsewhere classified (R26.2);Other symptoms and signs involving the nervous system (S06.301)     Time: 6010-9323 PT Time Calculation (min) (ACUTE ONLY): 40 min  Charges:  $Neuromuscular Re-education: 8-22 mins  Mabeline Caras, PT, DPT Acute Rehabilitation Services  Pager [phone] Office [place] 10/06/2021, 5:02 PM

## 2021-10-06 NOTE — Progress Notes (Signed)
Progress Note   Patient: [name] JQB:[id number] DOB: [birth year] DOA: 09/15/2021     21 DOS: the patient was seen and examined on 10/06/2021   Brief hospital course: 41 year old female with history of ESRD on dialysis Tuesdays Thursdays and Saturday, poorly controlled type 2 diabetes, hypertension she was initially sent in from dialysis session due to concern for altered mental status and lethargy.  She had a transmetatarsal amputation of the right foot 5 weeks prior to admission which grew MSSA and was receiving Ancef at dialysis.  X-ray of the right stump suggested osteomyelitis vascular surgery consulted recommended a below-knee amputation and she underwent BKA on 09/17/2021. From 09/18/2021 patient kept her eyes closed and was somnolent and was not able to or refusing to open her eyes.  On 09/22/2021 rapid response called for lethargy and minimally responsive to sternal rub PCCM and neurology were consulted.  MRI obtained shows scattered punctuate acute infarcts bilaterally and confluent region of the left corona radiata.  CT angiogram of the head and neck showed no large vessel occlusion.  EEG showed diffuse encephalopathy.  She was given pressors overnight and started antibiotics for possible endocarditis and was started on aspirin.  Echocardiogram shows positive for shunting and bicuspid aortic valve. She had a core track tube placed and feedings were started.    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  Patient followed by neurology. Transesophageal echo on 10/02/2021 showed small PFO with no thrombus.  On aspirin 325 mg daily.  Patient was started on Rocephin for possible endocarditis which has been stopped on 09/27/2021. CIR following.  #2 type 2 diabetes with hypoglycemia semiglee dose is being adjusted currently on 16 units nightly.  No further hypoglycemia. CBG (last 3)  Recent Labs    10/05/21 2347 10/06/21 0416 10/06/21 0750  GLUCAP 172* 221* 238*      #3 end-stage renal disease on hemodialysis Tuesday Thursday and Saturday  #4 hypotension resolved midodrine Dc'd  #5 moderate malnutrition due to acute illness as evidenced by fat and muscle depletion.  Continue Nepro shakes.  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  MRI of her orbits and cavernous sinuses ordered for Saturday morning immediately after which she gets her dialysis.  Obtained consent from her POA who is her sister in order to do the MRI with contrast.  Subjective: Resting in bed answers questions appropriately.  Apparently her husband stays with her every night. Physical Exam: Vitals:   10/05/21 0947 10/05/21 1519 10/05/21 1538 10/05/21 2017  BP: 109/63 (!) 91/27 123/69 (!) 125/52  Pulse: 100 92  96  Resp: 14   16  Temp: 98 F (36.7 C) 98.2 F (36.8 C)  99 F (37.2 C)  TempSrc: Oral Oral  Oral  SpO2: 98% 93%  95%  Weight:      Height:       General exam: in nad   HEENT:  no sclera icterus or thrush Respiratory system: Clear to auscultation. Respiratory effort normal. Cardiovascular system: S1 & S2 heard, regular rate and rhythm Gastrointestinal system: Abdomen soft, non-tender, nondistended. Normal bowel sounds   Central nervous system: keeps eyes closes, answers questions and follows commands Extremities:right bka staples in place cdi Skin: No rashes or ulcers Psychiatry:  Mood & affect appropriate.   Data Reviewed:mag 2.4 phos 4.6   Family Communication: none   Disposition:cir vs snf  Status is: Inpatient  Planned Discharge Destination: Skilled nursing facility  Dvt prophylaxis heparin  Time spent: 42 min minutes  Author: Georgette Shell, MD 10/06/2021 10:12 AM  For on call review [email or web address].

## 2021-10-07 ENCOUNTER — Inpatient Hospital Stay (HOSPITAL_COMMUNITY): Payer: Medicare Other

## 2021-10-07 DIAGNOSIS — M869 Osteomyelitis, unspecified: Secondary | ICD-10-CM | POA: Diagnosis not present

## 2021-10-07 LAB — CBC
HCT: 30 % — ABNORMAL LOW (ref 36.0–46.0)
Hemoglobin: 8.7 g/dL — ABNORMAL LOW (ref 12.0–15.0)
MCH: 28.6 pg (ref 26.0–34.0)
MCHC: 29 g/dL — ABNORMAL LOW (ref 30.0–36.0)
MCV: 98.7 fL (ref 80.0–100.0)
Platelets: 298 10*3/uL (ref 150–400)
RBC: 3.04 MIL/uL — ABNORMAL LOW (ref 3.87–5.11)
RDW: 25.1 % — ABNORMAL HIGH (ref 11.5–15.5)
WBC: 9.7 10*3/uL (ref 4.0–10.5)
nRBC: 0.4 % — ABNORMAL HIGH (ref 0.0–0.2)

## 2021-10-07 LAB — GLUCOSE, CAPILLARY
Glucose-Capillary: 224 mg/dL — ABNORMAL HIGH (ref 70–99)
Glucose-Capillary: 203 mg/dL — ABNORMAL HIGH (ref 70–99)
Glucose-Capillary: 175 mg/dL — ABNORMAL HIGH (ref 70–99)
Glucose-Capillary: 102 mg/dL — ABNORMAL HIGH (ref 70–99)
Glucose-Capillary: 93 mg/dL (ref 70–99)

## 2021-10-07 LAB — RENAL FUNCTION PANEL
Albumin: 2.1 g/dL — ABNORMAL LOW (ref 3.5–5.0)
Anion gap: 12 (ref 5–15)
BUN: 84 mg/dL — ABNORMAL HIGH (ref 6–20)
CO2: 27 mmol/L (ref 22–32)
Calcium: 10.2 mg/dL (ref 8.9–10.3)
Chloride: 92 mmol/L — ABNORMAL LOW (ref 98–111)
Creatinine, Ser: 7.81 mg/dL — ABNORMAL HIGH (ref 0.44–1.00)
GFR, Estimated: 6 mL/min — ABNORMAL LOW (ref >60)
Glucose, Bld: 188 mg/dL — ABNORMAL HIGH (ref 70–99)
Phosphorus: 5 mg/dL — ABNORMAL HIGH (ref 2.5–4.6)
Potassium: 5.7 mmol/L — ABNORMAL HIGH (ref 3.5–5.1)
Sodium: 131 mmol/L — ABNORMAL LOW (ref 135–145)

## 2021-10-07 MED ORDER — GADOBUTROL 1 MMOL/ML IV SOLN
5.0000 mL | Freq: Once | INTRAVENOUS | Status: AC | PRN
  Administered 2021-10-07: 5 mL via INTRAVENOUS

## 2021-10-07 NOTE — Procedures (Signed)
Unable to remove fluid from pt due to hypotension throughout treatment.  Pt was once again disruptive in the HD unit.  Her constant calling out for attention with no particular request.  I explained that unfortunately I had other pts and that she was definitely not alone in the unit.  She began attempting to request to be removed from tx again.  I explained that her removal from treatment the previous treatment and then again today would not be in her best interest. I reminded her that she required HD for her survival and although I understand it is a hardship for her that if she seriously felt she did not want to remain on her prescribed treatment time she should discuss her plan with her family.  I explained that I had the utmost respect for her decision.  However, that is something she needed to think over with her family and that until then she needed to remain on her treatment.  This patient would do well with a palliative care consult. Will discuss with bedside RN

## 2021-10-07 NOTE — Progress Notes (Addendum)
[facility] Progress Note   Subjective:   Patient seen and examined at bedside.  MRI this AM.  Plan for HD this afternoon.  Reports buttock pain, assisted NA to reposition patient.  Denies CP, SOB, abdominal pain and n/v/d.   Objective Vitals:   10/06/21 1212 10/06/21 2006 10/07/21 0710 10/07/21 0740  BP: (!) 143/67 110/65 (!) 141/72 119/75  Pulse: 95 94 99   Resp: 18 20 13 16   Temp: 98.5 F (36.9 C) 98.9 F (37.2 C)  98.5 F (36.9 C)  TempSrc:  Oral  Oral  SpO2: 100% 98%    Weight:      Height:       Physical Exam General:chronically ill appearing female in NAD Heart:RRR, no mrg Lungs:CTAB, nml WOB on RA Abdomen:soft, NTND Extremities:no LE edema, R BKA Dialysis Access: RU AVF +b/t   Filed Weights   10/03/21 1057 10/05/21 0734 10/05/21 0930  Weight: 56.8 kg 57.6 kg 57.1 kg    Intake/Output Summary (Last 24 hours) at 10/07/2021 1137 Last data filed at 10/06/2021 1800 Gross per 24 hour  Intake 120 ml  Output --  Net 120 ml    Additional Objective Labs: Basic Metabolic Panel: Recent Labs  Lab 10/03/21 0804 10/03/21 2125 10/04/21 1951 10/05/21 0644 10/05/21 0807  NA 128*  --   --   --  133*  K 5.0  --   --   --  5.2*  CL 90*  --   --   --  93*  CO2 24  --   --   --  28  GLUCOSE 100*  --   --   --  210*  BUN 65*  --   --   --  61*  CREATININE 7.90*  --   --   --  7.23*  CALCIUM 9.0  --   --   --  9.2  PHOS  --    < > 4.9* 5.0* 4.7*   < > = values in this interval not displayed.   Liver Function Tests: Recent Labs  Lab 10/03/21 0804 10/05/21 0807  AST 24  --   ALT 15  --   ALKPHOS 92  --   BILITOT 0.6  --   PROT 7.2  --   ALBUMIN 2.0* 2.1*   CBC: Recent Labs  Lab 10/03/21 0804 10/05/21 0807  WBC 11.8* 10.5  HGB 8.8* 8.6*  HCT 29.1* 29.0*  MCV 96.0 97.0  PLT 290 268   Recent Labs  Lab 10/06/21 1633 10/06/21 2001 10/06/21 2353 10/07/21 0508 10/07/21 0752  GLUCAP 151* 137* 182* 224* 203*   Medications:   aspirin EC   81 mg Oral Daily   atorvastatin  40 mg Oral Daily   Chlorhexidine Gluconate Cloth  6 each Topical Q0600   cinacalcet  90 mg Oral Q supper   clopidogrel  75 mg Oral Daily   darbepoetin (ARANESP) injection - DIALYSIS  200 mcg Intravenous Q Thu-HD   dorzolamide-timolol  1 drop Left Eye BID   feeding supplement (NEPRO CARB STEADY)  780 mL Per Tube Q24H   Gerhardt's butt cream   Topical TID   heparin injection (subcutaneous)  5,000 Units Subcutaneous Q8H   insulin aspart  0-6 Units Subcutaneous TID WC   insulin glargine-yfgn  16 Units Subcutaneous QHS   lidocaine-prilocaine  1 application Topical UD   mouth rinse  15 mL Mouth Rinse BID   multivitamin  1 tablet Oral QHS   nutrition supplement (  JUVEN)  1 packet Per Tube BID BM   pantoprazole sodium  40 mg Oral Daily   sevelamer carbonate  1.6 g Oral TID WC    Dialysis Orders: [facility] 3h 40min   400/ 600   59.5kg   2/2 bath  Prof 2   RU AV fistula  Hep 4000  Mircera 200 mcg IV every 2 weeks - last 1/26 Ancef 2g 3 times weekly HD Covid neg 2/3, hep B SAg neg 2/4   Assessment/Plan: AMS - acute embolic CVA and/or watershed infarcts. With PFO on TTE. TEE showed small PFO and no clot. Blood Cx 2/4 negative. S/p course of IV abx.  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 MRI of orbits/sinuses completed this AM, results pending. May need LP. 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  VVS following.  To go to CIR at some point.  ESRD - Usually TTS schedule - HD today. Trying to keep SBP > 100 on HD due to #1.  Sepsis/ shock - resolved  Anemia of CKD- last Hgb 8.6, continue Aranesp 275mcg q Thurs. Transfuse pRBC prn. Check labs pre HD.  MBD: CorrCa slightly high, Phos ok. Continue home sensipar/binders, hold VDRA for now.  Check labs pre HD.  HTN/volume - midodrine with HD. UF as tolerated w/ albumin support prn. Below prior EDW, will need to be lowered on d/c. Severe protein calorie malnutrition: NG tube in place with Nepro  TF's + supplements. DMT2 - per PMD  Dispo - likely will need SNF placement.  Jen Mow, PA-C [facility] 10/07/2021,11:37 AM  LOS: 22 days   Pt seen, examined and agree w A/P as above.  Kelly Splinter  MD 10/07/2021, 3:25 PM

## 2021-10-07 NOTE — Progress Notes (Signed)
Progress Note   Patient: [name] DOB: 06-22-1981 DOA: 09/15/2021     22 DOS: the patient was seen and examined on 10/07/2021   Brief hospital course: 41 year old female with history of ESRD on dialysis Tuesdays Thursdays and Saturday, poorly controlled type 2 diabetes, hypertension she was initially sent in from dialysis session due to concern for altered mental status and lethargy.  She had a transmetatarsal amputation of the right foot 5 weeks prior to admission which grew MSSA and was receiving Ancef at dialysis.  X-ray of the right stump suggested osteomyelitis vascular surgery consulted recommended a below-knee amputation and she underwent BKA on 09/17/2021. From 09/18/2021 patient kept her eyes closed and was somnolent and was not able to or refusing to open her eyes.  On 09/22/2021 rapid response called for lethargy and minimally responsive to sternal rub PCCM and neurology were consulted.  MRI obtained shows scattered punctuate acute infarcts bilaterally and confluent region of the left corona radiata.  CT angiogram of the head and neck showed no large vessel occlusion.  EEG showed diffuse encephalopathy.  She was given pressors overnight and started antibiotics for possible endocarditis and was started on aspirin.  Echocardiogram shows positive for shunting and bicuspid aortic valve. She had a core track tube placed and feedings were started.    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  Patient followed by neurology. Transesophageal echo on 10/02/2021 showed small PFO with no thrombus.  On aspirin 325 mg daily.  Patient was started on Rocephin for possible endocarditis which has been stopped on 09/27/2021. CIR following.  #2 type 2 diabetes with hypoglycemia semiglee dose is being adjusted currently on 16 units nightly.  No further hypoglycemia. CBG (last 3)  Recent Labs    10/07/21 0508 10/07/21 0752 10/07/21 1222  GLUCAP 224* 203* 175*      #3 end-stage renal disease on hemodialysis Tuesday Thursday and Saturday  #4 hypotension resolved midodrine Dc'd  #5 moderate malnutrition due to acute illness as evidenced by fat and muscle depletion.  Continue Nepro shakes.  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  MRI of her orbits and cavernous sinuses ordered for 2/25 morning immediately after which she gets her dialysis.  Obtained consent from her POA who is her sister in order to do the MRI with contrast.    Subjective:   Resting in bed  No events overnight  Physical Exam: Vitals:   10/06/21 1212 10/06/21 2006 10/07/21 0710 10/07/21 0740  BP: (!) 143/67 110/65 (!) 141/72 119/75  Pulse: 95 94 99   Resp: 18 20 13 16   Temp: 98.5 F (36.9 C) 98.9 F (37.2 C)  98.5 F (36.9 C)  TempSrc:  Oral  Oral  SpO2: 100% 98%    Weight:      Height:       General exam: in nad   HEENT:  no sclera icterus or thrush Respiratory system: Clear to auscultation. Respiratory effort normal. Cardiovascular system: S1 & S2 heard, regular rate and rhythm Gastrointestinal system: Abdomen soft, non-tender, nondistended. Normal bowel sounds   Central nervous system: keeps eyes closes, answers questions and follows commands Extremities:right bka staples in place cdi Skin: No rashes or ulcers Psychiatry:  Mood & affect appropriate.   Data Reviewed:mag 2.4 phos 4.6   Family Communication: none   Disposition:cir vs snf  Status is: Inpatient   Planned Discharge  Destination: Skilled nursing facility  Dvt prophylaxis heparin  Time spent: 42 min minutes  Author: Georgette Shell, MD 10/07/2021 12:46 PM  For on call review [email or web address].

## 2021-10-08 DIAGNOSIS — M869 Osteomyelitis, unspecified: Secondary | ICD-10-CM | POA: Diagnosis not present

## 2021-10-08 DIAGNOSIS — G629 Polyneuropathy, unspecified: Secondary | ICD-10-CM | POA: Diagnosis not present

## 2021-10-08 DIAGNOSIS — G1229 Other motor neuron disease: Secondary | ICD-10-CM

## 2021-10-08 LAB — GLUCOSE, CAPILLARY
Glucose-Capillary: 108 mg/dL — ABNORMAL HIGH (ref 70–99)
Glucose-Capillary: 117 mg/dL — ABNORMAL HIGH (ref 70–99)
Glucose-Capillary: 138 mg/dL — ABNORMAL HIGH (ref 70–99)
Glucose-Capillary: 139 mg/dL — ABNORMAL HIGH (ref 70–99)
Glucose-Capillary: 163 mg/dL — ABNORMAL HIGH (ref 70–99)
Glucose-Capillary: 186 mg/dL — ABNORMAL HIGH (ref 70–99)

## 2021-10-08 MED ORDER — THIAMINE HCL 100 MG/ML IJ SOLN
250.0000 mg | Freq: Three times a day (TID) | INTRAVENOUS | Status: AC
  Administered 2021-10-11 – 2021-10-14 (×7): 250 mg via INTRAVENOUS
  Filled 2021-10-08 (×9): qty 2.5

## 2021-10-08 MED ORDER — THIAMINE HCL 100 MG PO TABS
100.0000 mg | ORAL_TABLET | Freq: Every day | ORAL | Status: DC
  Administered 2021-10-16 – 2021-10-20 (×5): 100 mg via ORAL
  Filled 2021-10-08 (×5): qty 1

## 2021-10-08 MED ORDER — THIAMINE HCL 100 MG/ML IJ SOLN
500.0000 mg | Freq: Three times a day (TID) | INTRAVENOUS | Status: AC
  Administered 2021-10-08 – 2021-10-10 (×8): 500 mg via INTRAVENOUS
  Filled 2021-10-08 (×10): qty 5
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
## 2021-10-08 NOTE — Plan of Care (Signed)
°  Problem: Education: Goal: Knowledge of General Education information will improve Description: Including pain rating scale, medication(s)/side effects and non-pharmacologic comfort measures Outcome: Progressing   Problem: Health Behavior/Discharge Planning: Goal: Ability to manage health-related needs will improve Outcome: Progressing   Problem: Clinical Measurements: Goal: Ability to maintain clinical measurements within normal limits will improve Outcome: Progressing Goal: Diagnostic test results will improve Outcome: Progressing   Problem: Pain Managment: Goal: General experience of comfort will improve Outcome: Progressing   Problem: Safety: Goal: Ability to remain free from injury will improve Outcome: Progressing   Problem: Skin Integrity: Goal: Risk for impaired skin integrity will decrease Outcome: Progressing   Problem: Education: Goal: Required Educational Video(s) Outcome: Progressing   Problem: Clinical Measurements: Goal: Ability to maintain clinical measurements within normal limits will improve Outcome: Progressing Goal: Postoperative complications will be avoided or minimized Outcome: Progressing   Problem: Skin Integrity: Goal: Demonstration of wound healing without infection will improve Outcome: Progressing

## 2021-10-08 NOTE — Progress Notes (Signed)
Progress Note   Patient: [name] NFA:[id number] DOB: [birth year] DOA: 09/15/2021     23 DOS: the patient was seen and examined on 10/08/2021   Brief hospital course: 41 year old female with history of ESRD on dialysis Tuesdays Thursdays and Saturday, poorly controlled type 2 diabetes, hypertension she was initially sent in from dialysis session due to concern for altered mental status and lethargy.  She had a transmetatarsal amputation of the right foot 5 weeks prior to admission which grew MSSA and was receiving Ancef at dialysis.  X-ray of the right stump suggested osteomyelitis vascular surgery consulted recommended a below-knee amputation and she underwent BKA on 09/17/2021. From 09/18/2021 patient kept her eyes closed and was somnolent and was not able to or refusing to open her eyes.  On 09/22/2021 rapid response called for lethargy and minimally responsive to sternal rub PCCM and neurology were consulted.  MRI obtained shows scattered punctuate acute infarcts bilaterally and confluent region of the left corona radiata.  CT angiogram of the head and neck showed no large vessel occlusion.  EEG showed diffuse encephalopathy.  She was given pressors overnight and started antibiotics for possible endocarditis and was started on aspirin.  Echocardiogram shows positive for shunting and bicuspid aortic valve. She had a core track tube placed and feedings were started.    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  Patient followed by neurology. Transesophageal echo on 10/02/2021 showed small PFO with no thrombus.  On aspirin 325 mg daily.  Patient was started on Rocephin for possible endocarditis which has been stopped on 09/27/2021 as cultures remain negative. CIR following.  #2 type 2 diabetes with hypoglycemia semiglee dose is being adjusted currently on 16 units nightly.  No further hypoglycemia. CBG (last 3)  Recent Labs    10/08/21 0341 10/08/21 0757 10/08/21 1131   GLUCAP 117* 186* 138*     #3 end-stage renal disease on hemodialysis Tuesday Thursday and Saturday Patient is repeatedly  disruptive in the dialysis unit.  Constantly yelling and calling out for attention per nurse.    #4 hypotension resolved midodrine Dc'd  #5 moderate malnutrition due to acute illness as evidenced by fat and muscle depletion.  Continue Nepro shakes.  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  Concern for eschar formation in the right stump.  Patient now likely will need above-knee amputation.  #7 b/l ptosis-unclear etiology.  MRI of her orbits and cavernous sinuses does not show any acute pathology.  Neuro is doing extensive work-up.   Per Dr. Cheral Marker   Will need LP for cell count with differential, protein, glucose, cytology, IgG index, oligoclonal bands and VDRL. Will need to obtain in the AM on a weekday as the cytology sample must be sent immediately to the lab for processing, which does not occur on the weekends for cytology samples. Also will need to have subcutaneous heparin discontinued overnight prior to LP due to bleeding risk. Will need to be placed on SCDs while off SQ heparin.  - Thiamine level (ordered) - After thiamine level is drawn, start high-dose IV thiamine at 500 mg TID x 3 days, followed by 250 mg mg TID x 3 days, then switch to 100 mg po qd thereafter - Myasthenia gravis serum panel (ordered) - Anti-GQ1b, anti-MOG and anti GM1 antibodies. (Ordered as [facility] sendout) - Anti-NMO antibody (ordered) - Will need outpatient EMG/NCS after discharge. Will need outpatient Neurology follow up with  [facility] for this.  - Consider 30 day cardiac event monitoring as outpt  ordered for 2/25 morning immediately after which she gets her dialysis.  Obtained consent from her POA who is her sister in order to do the MRI with contrast.   #8 goals of care patient is full code and is getting  dialysis 3 times a week.  However she has multiple comorbidities even though she is young.  I will consult palliative care.  Her healthcare power of attorney is her sister. Subjective:   Resting in bed   physical Exam: Vitals:   10/07/21 1641 10/07/21 2152 10/08/21 0343 10/08/21 0800  BP: (!) 106/28 (!) 148/66 (!) 130/34 134/70  Pulse:  97 95 94  Resp: (!) 21  18 20   Temp: (!) 97.5 F (36.4 C) 98.7 F (37.1 C) 99.2 F (37.3 C) 98.5 F (36.9 C)  TempSrc:  Oral Oral Oral  SpO2:  100%  100%  Weight: 60.5 kg     Height:       General exam: in nad   HEENT:  no sclera icterus or thrush Respiratory system: Clear to auscultation. Respiratory effort normal. Cardiovascular system: S1 & S2 heard, regular rate and rhythm Gastrointestinal system: Abdomen soft, non-tender, nondistended. Normal bowel sounds   Central nervous system: keeps eyes closes, answers questions and follows commands Extremities:right bka staples in place cdi Skin: No rashes or ulcers Psychiatry:  Mood & affect appropriate.   Data Reviewed:mag 2.4 phos 4.6   Family Communication: none   Disposition:cir vs snf  Status is: Inpatient   Planned Discharge Destination: Skilled nursing facility  Dvt prophylaxis heparin  Time spent: 42 min minutes  Author: Georgette Shell, MD 10/08/2021 12:46 PM  For on call review [email or web address].

## 2021-10-08 NOTE — Progress Notes (Signed)
[facility]  Subjective  - no new complaints.   Objective 134/70 94 98.5 F (36.9 C) (Oral) 20 100%  Intake/Output Summary (Last 24 hours) at 10/08/2021 1036 Last data filed at 10/08/2021 0300 Gross per 24 hour  Intake 487.5 ml  Output -210 ml  Net 697.5 ml       Laboratory Lab Results: Recent Labs    10/07/21 1152  WBC 9.7  HGB 8.7*  HCT 30.0*  PLT 298   BMET Recent Labs    10/07/21 1152  NA 131*  K 5.7*  CL 92*  CO2 27  GLUCOSE 188*  BUN 84*  CREATININE 7.81*  CALCIUM 10.2    COAG Lab Results  Component Value Date   INR 1.2 08/10/2021   INR 1.1 06/30/2020   No results found for: PTT  Assessment/Planning:  41 year old female status post right BKA.  Appears to have some full-thickness eschar that is worsening with now some darkening of the posterior flap.  I suspect she will need conversion to above-knee amputation this week which I discussed with her.  Not sure how much she understands.  Will discuss with Dr. Trula Slade timing this week.  Marty Heck 10/08/2021 10:36 AM --

## 2021-10-08 NOTE — Progress Notes (Signed)
° °  Palliative Medicine Inpatient Follow Up Note   Reviewed the new consult on [name]. I shared via secure chat with Dr. Rodena Piety that the Palliative care team had signed off on her about a week ago. Patients sister adamant about full code and amenable to all life prolonging measures.   Dr. Rodena Piety shared that there is not need for re-consultation at this time.  We will thus remove the consult but can be re-consulted if additional support is needed at any time.  ______________________________________________________________________________________ [facility] Team Team Cell Phone: [phone] Please utilize secure chat with additional questions, if there is no response within 30 minutes please call the above phone number  Palliative Medicine Team providers are available by phone from 7am to 7pm daily and can be reached through the team cell phone.  Should this patient require assistance outside of these hours, please call the patient's attending physician.

## 2021-10-08 NOTE — Progress Notes (Signed)
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   Objective: Current vital signs: BP (!) 130/34 (BP Location: Left Arm)    Pulse 95    Temp 99.2 F (37.3 C) (Oral)    Resp 18    Ht 5\' 1"  (1.549 m)    Wt 60.5 kg    SpO2 100%    BMI 25.20 kg/m  Vital signs in last 24 hours: Temp:  [97.3 F (36.3 C)-99.2 F (37.3 C)] 99.2 F (37.3 C) (02/26 0343) Pulse Rate:  [95-100] 95 (02/26 0343) Resp:  [16-21] 18 (02/26 0343) BP: (68-148)/(27-77) 130/34 (02/26 0343) SpO2:  [96 %-100 %] 100 % (02/25 2152) Weight:  [60.2 kg-60.5 kg] 60.5 kg (02/25 1641)  Intake/Output from previous day: 02/25 0701 - 02/26 0700 In: 487.5 [NG/GT:487.5] Out: -210  Intake/Output this shift: No intake/output data recorded. Nutritional status:  Diet Order             DIET DYS 2 Room service appropriate? No; Fluid consistency: Thin  Diet effective now                  HEENT: [place]/AT Lungs: Respirations unlabored Ext: Right BKA  Neurologic Exam: Ment: Awake and alert. Oriented to the city, state, day of the week, month and year. Speech is fluent but dysarthric in the context of bilateral jaw, tongue and lip weakness as well as right facial droop. Intact comprehension and naming. Able to follow all commands. Good insight.  CN:  II: Right pupil irregular and unreactive, 3 mm (blind in right eye). Left pupil round, 3 mm, with no discernible reactivity to penlight. Left eye with moderate vision loss; able to identify a pen and a glove visually but could not count fingers correctly with left eye. Able to see a picture on an ID badge but unable to discern the lettering on the badge.  III, IV, VI:  Right eye with about 3 mm of abduction from rest position, which is about 5 mm abducted from the midlline. Unable to supraduct or infraduct right eye.  Left eye also with impaired motility but less severe than on the right. Able to gaze almost fully to the left (abduction) and able to adduct slightly, crossing  midline to the right by 3 mm only. Able to supraduct and infraduct by about 1 mm only. No nystagmus.  Lids: Profound ptosis is seen bilaterally. Unable to open eyelids with own power. Eyes fully closed at baseline.  V: Intact to FT bilaterally VII: Weak eyelid closure bilaterally; cannot clench eyelids tightly shut, but they do close fully. Right facial droop (recent stroke) on a background of diffuse CNVII weakness bilaterally in upper and lower quadrants of the face, to the extent that lip weakness compromises her speech.  VIII: Intact to conversation.  IX-X: unable to fully open mouth for assessment. Mild hypophonia noted.  XII: Weak tongue protrusion.  Motor: 4/5 BUE proximally and distally.  RLE: HF 3/5, KE 3/5 LLE: HF 3/5, KE 1/5, KF 3/5, ADF/APF 1/5 Sensory: Intact and symmetric to FT x 4 Reflexes: Hypoactive throughout Cerebellar: No ataxia with FNF bilaterally, but with optic ataxia (severe difficulty targeting objects visually with both hands) Gait: Unable to assess  Lab Results: Results for orders placed or performed during the hospital encounter of 09/15/21 (from the past 48 hour(s))  Glucose, capillary     Status: Abnormal   Collection Time: 10/06/21  7:50 AM  Result Value Ref Range   Glucose-Capillary 238 (H) 70 -  99 mg/dL    Comment: Glucose reference range applies only to samples taken after fasting for at least 8 hours.  Glucose, capillary     Status: Abnormal   Collection Time: 10/06/21 11:47 AM  Result Value Ref Range   Glucose-Capillary 191 (H) 70 - 99 mg/dL    Comment: Glucose reference range applies only to samples taken after fasting for at least 8 hours.  Glucose, capillary     Status: Abnormal   Collection Time: 10/06/21  4:33 PM  Result Value Ref Range   Glucose-Capillary 151 (H) 70 - 99 mg/dL    Comment: Glucose reference range applies only to samples taken after fasting for at least 8 hours.  Glucose, capillary     Status: Abnormal   Collection Time:  10/06/21  8:01 PM  Result Value Ref Range   Glucose-Capillary 137 (H) 70 - 99 mg/dL    Comment: Glucose reference range applies only to samples taken after fasting for at least 8 hours.  Glucose, capillary     Status: Abnormal   Collection Time: 10/06/21 11:53 PM  Result Value Ref Range   Glucose-Capillary 182 (H) 70 - 99 mg/dL    Comment: Glucose reference range applies only to samples taken after fasting for at least 8 hours.  Glucose, capillary     Status: Abnormal   Collection Time: 10/07/21  5:08 AM  Result Value Ref Range   Glucose-Capillary 224 (H) 70 - 99 mg/dL    Comment: Glucose reference range applies only to samples taken after fasting for at least 8 hours.  Glucose, capillary     Status: Abnormal   Collection Time: 10/07/21  7:52 AM  Result Value Ref Range   Glucose-Capillary 203 (H) 70 - 99 mg/dL    Comment: Glucose reference range applies only to samples taken after fasting for at least 8 hours.  CBC     Status: Abnormal   Collection Time: 10/07/21 11:52 AM  Result Value Ref Range   WBC 9.7 4.0 - 10.5 K/uL   RBC 3.04 (L) 3.87 - 5.11 MIL/uL   Hemoglobin 8.7 (L) 12.0 - 15.0 g/dL   HCT 30.0 (L) 36.0 - 46.0 %   MCV 98.7 80.0 - 100.0 fL   MCH 28.6 26.0 - 34.0 pg   MCHC 29.0 (L) 30.0 - 36.0 g/dL   RDW 25.1 (H) 11.5 - 15.5 %   Platelets 298 150 - 400 K/uL   nRBC 0.4 (H) 0.0 - 0.2 %    Comment: Performed at [facility] [address], [place], [place] [address]  Renal function panel     Status: Abnormal   Collection Time: 10/07/21 11:52 AM  Result Value Ref Range   Sodium 131 (L) 135 - 145 mmol/L   Potassium 5.7 (H) 3.5 - 5.1 mmol/L   Chloride 92 (L) 98 - 111 mmol/L   CO2 27 22 - 32 mmol/L   Glucose, Bld 188 (H) 70 - 99 mg/dL    Comment: Glucose reference range applies only to samples taken after fasting for at least 8 hours.   BUN 84 (H) 6 - 20 mg/dL   Creatinine, Ser 7.81 (H) 0.44 - 1.00 mg/dL   Calcium 10.2 8.9 - 10.3 mg/dL   Phosphorus 5.0 (H) 2.5 - 4.6  mg/dL   Albumin 2.1 (L) 3.5 - 5.0 g/dL   GFR, Estimated 6 (L) >60 mL/min    Comment: (NOTE) Calculated using the CKD-EPI Creatinine Equation (2021)    Anion gap  12 5 - 15    Comment: Performed at [facility], [place] [address] [place], [place] [address]  Glucose, capillary     Status: Abnormal   Collection Time: 10/07/21 12:22 PM  Result Value Ref Range   Glucose-Capillary 175 (H) 70 - 99 mg/dL    Comment: Glucose reference range applies only to samples taken after fasting for at least 8 hours.  Glucose, capillary     Status: Abnormal   Collection Time: 10/07/21  6:00 PM  Result Value Ref Range   Glucose-Capillary 102 (H) 70 - 99 mg/dL    Comment: Glucose reference range applies only to samples taken after fasting for at least 8 hours.  Glucose, capillary     Status: None   Collection Time: 10/07/21  8:20 PM  Result Value Ref Range   Glucose-Capillary 93 70 - 99 mg/dL    Comment: Glucose reference range applies only to samples taken after fasting for at least 8 hours.  Glucose, capillary     Status: Abnormal   Collection Time: 10/08/21 12:52 AM  Result Value Ref Range   Glucose-Capillary 139 (H) 70 - 99 mg/dL    Comment: Glucose reference range applies only to samples taken after fasting for at least 8 hours.  Glucose, capillary     Status: Abnormal   Collection Time: 10/08/21  3:41 AM  Result Value Ref Range   Glucose-Capillary 117 (H) 70 - 99 mg/dL    Comment: Glucose reference range applies only to samples taken after fasting for at least 8 hours.    No results found for this or any previous visit (from the past 240 hour(s)).  Lipid Panel No results for input(s): CHOL, TRIG, HDL, CHOLHDL, VLDL, LDLCALC in the last 72 hours.  Studies/Results: MR BRAIN W WO CONTRAST  Result Date: 10/07/2021 CLINICAL DATA:  Ophthalmoplegia EXAM: MRI HEAD AND ORBITS WITHOUT AND WITH CONTRAST TECHNIQUE: Multiplanar, multiecho pulse sequences of the brain and surrounding structures were  obtained without and with intravenous contrast. Multiplanar, multiecho pulse sequences of the orbits and surrounding structures were obtained including fat saturation techniques, before and after intravenous contrast administration. CONTRAST:  95mL GADAVIST GADOBUTROL 1 MMOL/ML IV SOLN COMPARISON:  MRI brain 09/22/2021 FINDINGS: MRI HEAD FINDINGS Brain: Evolution of infarcts seen on the prior study. No acute infarct. Stable chronic infarcts and chronic microvascular ischemic changes. Scattered chronic microhemorrhages are again noted. Ventricles are stable in size. No intracranial mass or abnormal enhancement. Vascular: Major vessel flow voids at the skull base are preserved. Skull and upper cervical spine: Mildly decreased T1 marrow signal likely reflects hematopoietic marrow. Other: Cavernous sinus enhancement is symmetric and unremarkable. Persistent left greater than right mastoid fluid opacification. Sella is unremarkable. MRI ORBITS FINDINGS Orbits: Bilateral lens replacements. Chronic right retinal detachment. Extraocular muscles are symmetric and unremarkable. No abnormal enhancement of the optic nerve sheath complexes. Visualized sinuses: Trace mucosal thickening. Soft tissues: Unremarkable. IMPRESSION: Evolution of infarcts seen on the prior study. No acute infarction, hemorrhage, or abnormal enhancement. Chronic right retinal detachment.  No acute orbital abnormality. Electronically Signed   By: Macy Mis M.D.   On: 10/07/2021 11:59   MR ORBITS W WO CONTRAST  Result Date: 10/07/2021 CLINICAL DATA:  Ophthalmoplegia EXAM: MRI HEAD AND ORBITS WITHOUT AND WITH CONTRAST TECHNIQUE: Multiplanar, multiecho pulse sequences of the brain and surrounding structures were obtained without and with intravenous contrast. Multiplanar, multiecho pulse sequences of the orbits and surrounding structures were obtained including fat saturation techniques, before and  after intravenous contrast administration. CONTRAST:   31mL GADAVIST GADOBUTROL 1 MMOL/ML IV SOLN COMPARISON:  MRI brain 09/22/2021 FINDINGS: MRI HEAD FINDINGS Brain: Evolution of infarcts seen on the prior study. No acute infarct. Stable chronic infarcts and chronic microvascular ischemic changes. Scattered chronic microhemorrhages are again noted. Ventricles are stable in size. No intracranial mass or abnormal enhancement. Vascular: Major vessel flow voids at the skull base are preserved. Skull and upper cervical spine: Mildly decreased T1 marrow signal likely reflects hematopoietic marrow. Other: Cavernous sinus enhancement is symmetric and unremarkable. Persistent left greater than right mastoid fluid opacification. Sella is unremarkable. MRI ORBITS FINDINGS Orbits: Bilateral lens replacements. Chronic right retinal detachment. Extraocular muscles are symmetric and unremarkable. No abnormal enhancement of the optic nerve sheath complexes. Visualized sinuses: Trace mucosal thickening. Soft tissues: Unremarkable. IMPRESSION: Evolution of infarcts seen on the prior study. No acute infarction, hemorrhage, or abnormal enhancement. Chronic right retinal detachment.  No acute orbital abnormality. Electronically Signed   By: Macy Mis M.D.   On: 10/07/2021 11:59    Medications: Scheduled:  aspirin EC  81 mg Oral Daily   atorvastatin  40 mg Oral Daily   Chlorhexidine Gluconate Cloth  6 each Topical Q0600   cinacalcet  90 mg Oral Q supper   clopidogrel  75 mg Oral Daily   darbepoetin (ARANESP) injection - DIALYSIS  200 mcg Intravenous Q Thu-HD   dorzolamide-timolol  1 drop Left Eye BID   feeding supplement (NEPRO CARB STEADY)  780 mL Per Tube Q24H   Gerhardt's butt cream   Topical TID   heparin injection (subcutaneous)  5,000 Units Subcutaneous Q8H   insulin aspart  0-6 Units Subcutaneous TID WC   insulin glargine-yfgn  16 Units Subcutaneous QHS   lidocaine-prilocaine  1 application Topical UD   mouth rinse  15 mL Mouth Rinse BID   multivitamin  1 tablet  Oral QHS   nutrition supplement (JUVEN)  1 packet Per Tube BID BM   pantoprazole sodium  40 mg Oral Daily   sevelamer carbonate  1.6 g Oral TID WC    Assessment: 41 y.o. female with history of ESRD on HD T/Th/Sat, type 2 diabetes mellitus with neuropathy, hypertension, osteomyelitis with BKA, right eye blindness, and medical non-adherence presenting with  lethargy and AMS following HD on 2/3.  Initial work up in the ED revealed patient with leukocytosis concerning for infection and imaging with concern for osteomyelitis for which vascular surgery recommended admission for BKA on 2/5 and ongoing antibiotics. Of note, patient was discharged in January following urgent transmetatarsal amputation after being found with gangrenous toes s/p wound vac and IV Ancef after wound cultures grew MSSA. During hospitalization, patient's mental status initially improved and she was responding appropriately on evaluation on 2/4 with surgery for BKA on 2/5. On 2/6, she was noted to be somnolent and slow to respond but she was answering questions appropriately. On 2/7 patient had an episode of hypotension with HD but was noted to still be lethargic but followed commands and answered questions appropriately. Patient again had HD on 2/9 and became more lethargic, patient lethargic on exam during HD on 2/11. TEE completed- no thrombus detected, small PFO noted. Start atorvastatin, ASA 81mg  and plavix 75mg . No longer encephalopathic. Inpatient stroke work up completed.      - Bilateral ptosis Patient has not been able to open eyes for quite some time. She states that this has been ongoing for about the past month. She endorses total loss of vision  in her right eye that is and severe loss of vision in her left eye which she estimates has been present for about 1 month as well. even follows all simple commands and answer question appropriately.  Exam confirms complete blindness OD and severe loss of visual acuity OS. Also with  severe bilateral ptosis that localizes to the 3rd cranial nerve. Right pupil irregular and 2-3 mm with no reactivity. Left pupil with minimal sluggish reactivity, irregular and 3 mm. Ocular motility is severely impaired; she is exotropic with right eye deviated laterally and only about 3 mm of medial rotation of the globe when asked to look to the left. Left eye with severely decreased motility than right eye, but less profound than on the right; left eye partially abducted with about 4 mm of movement medially and about 2 mm of movement in upward and downward gaze in the context of poor patient cooperation. Overall findings best localize to the cavernous sinuses bilaterally, possibly secondary to an inflammatory process (possibly severe manifestations of Tolosa-Hunt syndrome, an infiltrative process or bilateral cavernous sinus thrombosis). She also states that touching her left eyelid and orbital rim during exam is painful, which would be compatible with the above DDx.  DDx: Eye opening apraxia is felt to be unlikely. Cranial polyneuropathy including CN III IV VI is possible. Miller-Fisher variant of GBS is felt to be higher on the DDx given negative MRI brain and orbits (see below). Atypical predominantly bulbar presentation of AIDP is also possible; her BLE weakness and hyporeflexia further support this possibility. Critical illness neuropathy/myopathy would not be expected to present disproportionately with eye involvement but this is also possible. Bulbar myopathy or idiopathic cranial polyneuropathy also on the DDx. Botulism and tick paralysis are unlikely as she has not had any dyspnea; however, the pupil in her left eye has no discernible reactivity, which would be expected for botulism (right eye with prior retinal detachment) and she also has swallowing difficulty requiring pureed diet. An atypical presentation of myasthenia gravis is also possible. NMO is on the DDx given BLE weakness and vision loss.   MRI brain  with and without contrast performed yesterday (2/25): Repeat MRI brain with and without contrast (2/25): There is expected evolution of the  infarcts seen on the prior study. No acute infarct. Stable chronic infarcts and chronic microvascular ischemic changes. Scattered chronic microhemorrhages are again noted. Ventricles are stable in size. No intracranial mass or abnormal enhancement to explain the patient's bilateral ptosis and ophthalmoplegia. Cavernous sinus enhancement is symmetric and unremarkable. Persistent left greater than right mastoid fluid opacification. Sella is unremarkable. MRI orbits with and without contrast performed yesterday (2/25): Bilateral lens replacements. Chronic right retinal detachment. Extraocular muscles are symmetric and unremarkable. No abnormal enhancement of the optic nerve sheath complexes.  6. Impression: Given no cavernous sinus pathology identified on MRI to explain the patient's severe bilateral ptosis and ophthalmoparesis, rare potential etiologies will now need to be considered (in addition to above DDx). 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 Her ophthalmoparesis and possible cognitive impairment on exam (she is poorly cooperative, making determination of cognitive impairment difficult) would make thiamine deficiency more likely. Unfortunately, ataxia cannot be tested as she is noncompliant with this and several other components of the exam. Will need thiamine level followed by empiric thiamine as well as an LP. Other work up for her ptosis and ophthalmoparesis is beyond the scope of this inpatient admission and she will need  to be evaluated outpatient by a Neurophthalmologist (Dr. Michel Bickers of [facility] is recommended: [phone]).   Recommendations: - Will need LP for cell count with differential, protein, glucose, cytology, IgG index, oligoclonal bands  and VDRL. Will need to obtain in the AM on a weekday as the cytology sample must be sent immediately to the lab for processing, which does not occur on the weekends for cytology samples. Also will need to have subcutaneous heparin discontinued overnight prior to LP due to bleeding risk. Will need to be placed on SCDs while off SQ heparin.  - Thiamine level (ordered) - After thiamine level is drawn, start high-dose IV thiamine at 500 mg TID x 3 days, followed by 250 mg mg TID x 3 days, then switch to 100 mg po qd thereafter - Myasthenia gravis serum panel (ordered) - Anti-GQ1b, anti-MOG and anti GM1 antibodies. (Ordered as [facility] sendout) - Anti-NMO antibody (ordered) - Will need outpatient EMG/NCS after discharge. Will need outpatient Neurology follow up with [facility] for this.  - Consider 30 day cardiac event monitoring as outpt.   LOS: 23 days   @Electronically  signed: Dr. Kerney Elbe 10/08/2021  7:48 AM

## 2021-10-09 DIAGNOSIS — G610 Guillain-Barre syndrome: Secondary | ICD-10-CM | POA: Diagnosis not present

## 2021-10-09 DIAGNOSIS — M869 Osteomyelitis, unspecified: Secondary | ICD-10-CM | POA: Diagnosis not present

## 2021-10-09 LAB — GLUCOSE, CAPILLARY
Glucose-Capillary: 132 mg/dL — ABNORMAL HIGH (ref 70–99)
Glucose-Capillary: 158 mg/dL — ABNORMAL HIGH (ref 70–99)
Glucose-Capillary: 172 mg/dL — ABNORMAL HIGH (ref 70–99)
Glucose-Capillary: 186 mg/dL — ABNORMAL HIGH (ref 70–99)
Glucose-Capillary: 198 mg/dL — ABNORMAL HIGH (ref 70–99)
Glucose-Capillary: 232 mg/dL — ABNORMAL HIGH (ref 70–99)
Glucose-Capillary: 240 mg/dL — ABNORMAL HIGH (ref 70–99)

## 2021-10-09 LAB — CBC
HCT: 29.1 % — ABNORMAL LOW (ref 36.0–46.0)
Hemoglobin: 8.6 g/dL — ABNORMAL LOW (ref 12.0–15.0)
MCH: 29.3 pg (ref 26.0–34.0)
MCHC: 29.6 g/dL — ABNORMAL LOW (ref 30.0–36.0)
MCV: 99 fL (ref 80.0–100.0)
Platelets: 287 10*3/uL (ref 150–400)
RBC: 2.94 MIL/uL — ABNORMAL LOW (ref 3.87–5.11)
RDW: 23.9 % — ABNORMAL HIGH (ref 11.5–15.5)
WBC: 9.5 10*3/uL (ref 4.0–10.5)
nRBC: 0.5 % — ABNORMAL HIGH (ref 0.0–0.2)

## 2021-10-09 LAB — COMPREHENSIVE METABOLIC PANEL
ALT: 15 U/L (ref 0–44)
AST: 20 U/L (ref 15–41)
Albumin: 2.1 g/dL — ABNORMAL LOW (ref 3.5–5.0)
Alkaline Phosphatase: 96 U/L (ref 38–126)
Anion gap: 10 (ref 5–15)
BUN: 84 mg/dL — ABNORMAL HIGH (ref 6–20)
CO2: 27 mmol/L (ref 22–32)
Calcium: 10.5 mg/dL — ABNORMAL HIGH (ref 8.9–10.3)
Chloride: 96 mmol/L — ABNORMAL LOW (ref 98–111)
Creatinine, Ser: 6.52 mg/dL — ABNORMAL HIGH (ref 0.44–1.00)
GFR, Estimated: 8 mL/min — ABNORMAL LOW (ref >60)
Glucose, Bld: 223 mg/dL — ABNORMAL HIGH (ref 70–99)
Potassium: 5.1 mmol/L (ref 3.5–5.1)
Sodium: 133 mmol/L — ABNORMAL LOW (ref 135–145)
Total Bilirubin: 0.3 mg/dL (ref 0.3–1.2)
Total Protein: 7.3 g/dL (ref 6.5–8.1)

## 2021-10-09 MED ORDER — INSULIN GLARGINE-YFGN 100 UNIT/ML ~~LOC~~ SOLN
18.0000 [IU] | Freq: Every day | SUBCUTANEOUS | Status: DC
  Administered 2021-10-09 – 2021-10-10 (×2): 18 [IU] via SUBCUTANEOUS
  Filled 2021-10-09 (×3): qty 0.18

## 2021-10-09 MED ORDER — HEPARIN SODIUM (PORCINE) 5000 UNIT/ML IJ SOLN
5000.0000 [IU] | Freq: Three times a day (TID) | INTRAMUSCULAR | Status: DC
  Administered 2021-10-09 – 2021-10-25 (×41): 5000 [IU] via SUBCUTANEOUS
  Filled 2021-10-09 (×42): qty 1

## 2021-10-09 MED ORDER — ASPIRIN EC 81 MG PO TBEC
81.0000 mg | DELAYED_RELEASE_TABLET | Freq: Every day | ORAL | Status: DC
  Administered 2021-10-09 – 2021-10-12 (×3): 81 mg via ORAL
  Filled 2021-10-09 (×3): qty 1

## 2021-10-09 NOTE — Progress Notes (Addendum)
°  Progress Note    10/09/2021 7:59 AM 7 Days Post-Op  Subjective:  very somnolent   Vitals:   10/08/21 2145 10/09/21 0341  BP: 135/70 122/67  Pulse: 92 90  Resp: 20 18  Temp: 99 F (37.2 C) 98.2 F (36.8 C)  SpO2: 100% 100%   Physical Exam: Cardiac:  regular Lungs:  non labored Incisions: right BKA with continued sloughing of skin and eschar along staple line. No active drainage   CBC    Component Value Date/Time   WBC 9.7 10/07/2021 1152   RBC 3.04 (L) 10/07/2021 1152   HGB 8.7 (L) 10/07/2021 1152   HGB 10.6 (L) 05/18/2020 0927   HCT 30.0 (L) 10/07/2021 1152   HCT 35.1 05/18/2020 0927   PLT 298 10/07/2021 1152   PLT 186 05/18/2020 0927   MCV 98.7 10/07/2021 1152   MCV 89 05/18/2020 0927   MCH 28.6 10/07/2021 1152   MCHC 29.0 (L) 10/07/2021 1152   RDW 25.1 (H) 10/07/2021 1152   RDW 13.9 05/18/2020 0927   LYMPHSABS 1.9 09/27/2021 0129   LYMPHSABS 1.6 05/18/2020 0927   MONOABS 1.3 (H) 09/27/2021 0129   EOSABS 0.4 09/27/2021 0129   EOSABS 0.6 (H) 05/18/2020 0927   BASOSABS 0.1 09/27/2021 0129   BASOSABS 0.0 05/18/2020 0927    BMET    Component Value Date/Time   NA 131 (L) 10/07/2021 1152   NA 133 (L) 05/18/2020 0927   K 5.7 (H) 10/07/2021 1152   CL 92 (L) 10/07/2021 1152   CO2 27 10/07/2021 1152   GLUCOSE 188 (H) 10/07/2021 1152   BUN 84 (H) 10/07/2021 1152   BUN 45 (H) 05/18/2020 0927   CREATININE 7.81 (H) 10/07/2021 1152   CALCIUM 10.2 10/07/2021 1152   GFRNONAA 6 (L) 10/07/2021 1152   GFRAA 6 (L) 05/18/2020 0927    INR    Component Value Date/Time   INR 1.2 08/10/2021 2212     Intake/Output Summary (Last 24 hours) at 10/09/2021 0759 Last data filed at 10/09/2021 5573 Gross per 24 hour  Intake 1741.33 ml  Output --  Net 1741.33 ml     Assessment/Plan:  41 y.o. female is s/p R BKA. BKA with skin sloughing and necrosis along staple line with large eschar. No emergent indication for revision to AKA. No signs of infection. Can continue to  follow. Likely will need conversion to AKA. Patient very somnolent this morning. Will have Dr. Trula Slade discuss timing of conversion with her this week  [name], PA-C [facility] [phone] 10/09/2021 7:59 AM

## 2021-10-09 NOTE — Plan of Care (Signed)

## 2021-10-09 NOTE — Progress Notes (Signed)
Occupational Therapy Treatment Patient Details Name: [name] MRN: [id number] DOB: 09/18/1980 Today's Date: 10/09/2021   History of present illness Pt is a 41 y.o. female admitted 09/15/21 post-HD session with concern for AMS when pt stopped speaking to staff. 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 Pt with increased somnolence 2/6; noted to be more lethargic in HD 2/7, 2/9, 2/11. Head CT 2/10 negative for acute injury; chronic small vessel disease and infarcts. MRI 2/10 with scattered punctate acute infarcts bilateral cerebral hemispheres (may be embolic), potential subacute infarct L corona radiata. Pt with persistent bilateral ptosis (pt reports ongoing ~1 month); awaiting MRI of orbits on 2/25. PMH includes ESRD (HD TTS), DM2, HTN, neuropathy, R eye blindness (2008).   OT comments  This 41 yo female admitted with above, seen today to work on self feeding at bed level. Pt did total A to orient to where good was on tray, but then she was able to find items asked of her. She did not do well with regular size spoon (so added red tubing to it) and she did much better. Does better with small individual bowls of food than food on plate OR finger foods--which are limited currently due to where swallowing precautions. She will continue to benefit from acute OT with follow up on CIR.   Recommendations for follow up therapy are one component of a multi-disciplinary discharge planning process, led by the attending physician.  Recommendations may be updated based on patient status, additional functional criteria and insurance authorization.    Follow Up Recommendations  Acute inpatient rehab (3hours/day)    Assistance Recommended at Discharge Frequent or constant Supervision/Assistance  Patient can return home with the following  A lot of help with walking and/or transfers;A lot of help with bathing/dressing/bathroom;Assistance with cooking/housework;Assistance  with feeding;Direct supervision/assist for medications management;Direct supervision/assist for financial management;Assist for transportation   Equipment Recommendations  None recommended by OT       Precautions / Restrictions Precautions Precautions: Fall Precaution Comments: Rt BKA, new bil ptosis with blindness, bowel incontinence Required Braces or Orthoses: Other Brace Other Brace: RLE limb guard Restrictions Weight Bearing Restrictions: Yes RLE Weight Bearing: Non weight bearing       Mobility Bed Mobility Overal bed mobility: Needs Assistance Bed Mobility: Rolling           General bed mobility comments: Rolling right min A , rolling left Mod A (says she can't, but with encouagement she will try)           ADL either performed or assessed with clinical judgement   ADL Overall ADL's : Needs assistance/impaired Eating/Feeding: Moderate assistance;Bed level Eating/Feeding Details (indicate cue type and reason): Pillows up under each hip, HOB all the way up, towel across chest. Did fine with finger foods once oriented to where items were. Did much better with built up red foam handle and food eaten out of containers (applesauce and cream of wheat containers--soup bowls too big for her to handle)                                        Extremity/Trunk Assessment Upper Extremity Assessment Upper Extremity Assessment: Generalized weakness LUE Deficits / Details: Pt noted decrease in control of BUE with coordination/use. Pt does better when able to grasp onto larger items (ie: red tubing for spoon)  Vision Ability to See in Adequate Light: 4 Severely impaired Additional Comments: pt with Bil ptosis, is now fully blind          Cognition Arousal/Alertness: Awake/alert Behavior During Therapy: Flat affect Overall Cognitive Status: Impaired/Different from baseline Area of Impairment: Following commands, Problem solving, Attention                    Current Attention Level: Sustained   Following Commands: Follows one step commands with increased time     Problem Solving: Slow processing, Difficulty sequencing, Requires verbal cues, Requires tactile cues General Comments: She had trouble orienting spoon in her left hand (left hand dominant)--did better with built up handle                   Pertinent Vitals/ Pain       Pain Assessment Pain Assessment: Faces Faces Pain Scale: Hurts even more Pain Location: buttocks with HOB is up for self feeding Pain Descriptors / Indicators: Grimacing, Throbbing Pain Intervention(s): Limited activity within patient's tolerance, Repositioned (placed pillows under each side of her hips)         Frequency  Min 2X/week        Progress Toward Goals  OT Goals(current goals can now be found in the care plan section)  Progress towards OT goals: Progressing toward goals  Acute Rehab OT Goals Patient Stated Goal: to lay back and turn to left side post eating OT Goal Formulation: With patient Time For Goal Achievement: 10/17/21 Potential to Achieve Goals: Fair ADL Goals Pt Will Perform Eating: (P)  (pt will be able to feed herself 50% of her meals with proper setup and built up spoon.)  Plan Discharge plan remains appropriate       AM-PAC OT "6 Clicks" Daily Activity     Outcome Measure   Help from another person eating meals?: A Lot Help from another person taking care of personal grooming?: A Little Help from another person toileting, which includes using toliet, bedpan, or urinal?: Total Help from another person bathing (including washing, rinsing, drying)?: A Lot Help from another person to put on and taking off regular upper body clothing?: Total Help from another person to put on and taking off regular lower body clothing?: Total 6 Click Score: 10    End of Session    OT Visit Diagnosis: Other abnormalities of gait and mobility (R26.89);Unsteadiness  on feet (R26.81);Muscle weakness (generalized) (M62.81);Other symptoms and signs involving cognitive function;Low vision, both eyes (H54.2) Pain - part of body:  (buttocks)   Activity Tolerance Patient tolerated treatment well   Patient Left in bed;with call bell/phone within reach;with bed alarm set (neurologist in room)   Nurse Communication  (chat text about self feeding)        Time: 6546-5035 OT Time Calculation (min): 61 min  Charges: OT General Charges $OT Visit: 1 Visit OT Treatments $Self Care/Home Management : 53-67 mins  [place] [address], OTR/L Acute Rehab Services Pager [phone] Office [phone]    Almon Register 10/09/2021, 9:42 AM

## 2021-10-09 NOTE — Progress Notes (Signed)
Inpatient Rehab Admissions Coordinator:   I do not have a CIR bed for this Pt. Today. Pt. May require further surgery later this week, so I will hold admission until decision is made on that.   Clemens Catholic, [place], [place] Admissions Coordinator  [phone] ([place]) [phone] (office)

## 2021-10-09 NOTE — H&P (View-Only) (Signed)
°  Progress Note    10/09/2021 7:59 AM 7 Days Post-Op  Subjective:  very somnolent   Vitals:   10/08/21 2145 10/09/21 0341  BP: 135/70 122/67  Pulse: 92 90  Resp: 20 18  Temp: 99 F (37.2 C) 98.2 F (36.8 C)  SpO2: 100% 100%   Physical Exam: Cardiac:  regular Lungs:  non labored Incisions: right BKA with continued sloughing of skin and eschar along staple line. No active drainage   CBC    Component Value Date/Time   WBC 9.7 10/07/2021 1152   RBC 3.04 (L) 10/07/2021 1152   HGB 8.7 (L) 10/07/2021 1152   HGB 10.6 (L) 05/18/2020 0927   HCT 30.0 (L) 10/07/2021 1152   HCT 35.1 05/18/2020 0927   PLT 298 10/07/2021 1152   PLT 186 05/18/2020 0927   MCV 98.7 10/07/2021 1152   MCV 89 05/18/2020 0927   MCH 28.6 10/07/2021 1152   MCHC 29.0 (L) 10/07/2021 1152   RDW 25.1 (H) 10/07/2021 1152   RDW 13.9 05/18/2020 0927   LYMPHSABS 1.9 09/27/2021 0129   LYMPHSABS 1.6 05/18/2020 0927   MONOABS 1.3 (H) 09/27/2021 0129   EOSABS 0.4 09/27/2021 0129   EOSABS 0.6 (H) 05/18/2020 0927   BASOSABS 0.1 09/27/2021 0129   BASOSABS 0.0 05/18/2020 0927    BMET    Component Value Date/Time   NA 131 (L) 10/07/2021 1152   NA 133 (L) 05/18/2020 0927   K 5.7 (H) 10/07/2021 1152   CL 92 (L) 10/07/2021 1152   CO2 27 10/07/2021 1152   GLUCOSE 188 (H) 10/07/2021 1152   BUN 84 (H) 10/07/2021 1152   BUN 45 (H) 05/18/2020 0927   CREATININE 7.81 (H) 10/07/2021 1152   CALCIUM 10.2 10/07/2021 1152   GFRNONAA 6 (L) 10/07/2021 1152   GFRAA 6 (L) 05/18/2020 0927    INR    Component Value Date/Time   INR 1.2 08/10/2021 2212     Intake/Output Summary (Last 24 hours) at 10/09/2021 0759 Last data filed at 10/09/2021 8088 Gross per 24 hour  Intake 1741.33 ml  Output --  Net 1741.33 ml     Assessment/Plan:  41 y.o. female is s/p R BKA. BKA with skin sloughing and necrosis along staple line with large eschar. No emergent indication for revision to AKA. No signs of infection. Can continue to  follow. Likely will need conversion to AKA. Patient very somnolent this morning. Will have Dr. Trula Slade discuss timing of conversion with her this week  [name], PA-C [facility] [phone] 10/09/2021 7:59 AM

## 2021-10-09 NOTE — Progress Notes (Addendum)
[facility] Progress Note   Subjective:    Seen and examined at bedside. No complaints. Denies SOB, CP, and N/V. Plan for HD 2/28.  Objective Vitals:   10/09/21 0515 10/09/21 0802 10/09/21 1016 10/09/21 1032  BP:  (!) 96/33 116/65 116/65  Pulse:  (!) 103 96 96  Resp:  16 15 15   Temp:  99.3 F (37.4 C) 98.3 F (36.8 C) 98.3 F (36.8 C)  TempSrc:  Oral  Oral  SpO2:  99%  100%  Weight: 61 kg     Height:       Physical Exam General:chronically ill appearing female in NAD HEENT: Core track tube L nare Heart:RRR, no mrg Lungs:CTAB, nml WOB Abdomen:soft, NTND Extremities:no LE edema, R BKA Dialysis Access: RU AVF +b/t   Filed Weights   10/07/21 1300 10/07/21 1641 10/09/21 0515  Weight: 60.2 kg 60.5 kg 61 kg    Intake/Output Summary (Last 24 hours) at 10/09/2021 1115 Last data filed at 10/09/2021 1001 Gross per 24 hour  Intake 1684.33 ml  Output --  Net 1684.33 ml    Additional Objective Labs: Basic Metabolic Panel: Recent Labs  Lab 10/03/21 0804 10/03/21 2125 10/05/21 0644 10/05/21 0807 10/07/21 1152  NA 128*  --   --  133* 131*  K 5.0  --   --  5.2* 5.7*  CL 90*  --   --  93* 92*  CO2 24  --   --  28 27  GLUCOSE 100*  --   --  210* 188*  BUN 65*  --   --  61* 84*  CREATININE 7.90*  --   --  7.23* 7.81*  CALCIUM 9.0  --   --  9.2 10.2  PHOS  --    < > 5.0* 4.7* 5.0*   < > = values in this interval not displayed.   Liver Function Tests: Recent Labs  Lab 10/03/21 0804 10/05/21 0807 10/07/21 1152  AST 24  --   --   ALT 15  --   --   ALKPHOS 92  --   --   BILITOT 0.6  --   --   PROT 7.2  --   --   ALBUMIN 2.0* 2.1* 2.1*   No results for input(s): LIPASE, AMYLASE in the last 168 hours. CBC: Recent Labs  Lab 10/03/21 0804 10/05/21 0807 10/07/21 1152  WBC 11.8* 10.5 9.7  HGB 8.8* 8.6* 8.7*  HCT 29.1* 29.0* 30.0*  MCV 96.0 97.0 98.7  PLT 290 268 298   Blood Culture    Component Value Date/Time   SDES BLOOD LEFT HAND  09/22/2021 1554   SPECREQUEST  09/22/2021 1554    BOTTLES DRAWN AEROBIC AND ANAEROBIC Blood Culture results may not be optimal due to an inadequate volume of blood received in culture bottles   CULT  09/22/2021 1554    NO GROWTH 5 DAYS Performed at [facility], [place] [address] [place], [place] [address]    REPTSTATUS 09/27/2021 FINAL 09/22/2021 1554    Cardiac Enzymes: No results for input(s): CKTOTAL, CKMB, CKMBINDEX, TROPONINI in the last 168 hours. CBG: Recent Labs  Lab 10/08/21 1616 10/08/21 1944 10/09/21 0051 10/09/21 0351 10/09/21 0801  GLUCAP 163* 108* 172* 240* 232*   Iron Studies: No results for input(s): IRON, TIBC, TRANSFERRIN, FERRITIN in the last 72 hours. Lab Results  Component Value Date   INR 1.2 08/10/2021   INR 1.1 06/30/2020   Studies/Results: MR BRAIN W WO CONTRAST  Result Date: 10/07/2021 CLINICAL DATA:  Ophthalmoplegia EXAM: MRI HEAD AND ORBITS WITHOUT AND WITH CONTRAST TECHNIQUE: Multiplanar, multiecho pulse sequences of the brain and surrounding structures were obtained without and with intravenous contrast. Multiplanar, multiecho pulse sequences of the orbits and surrounding structures were obtained including fat saturation techniques, before and after intravenous contrast administration. CONTRAST:  54mL GADAVIST GADOBUTROL 1 MMOL/ML IV SOLN COMPARISON:  MRI brain 09/22/2021 FINDINGS: MRI HEAD FINDINGS Brain: Evolution of infarcts seen on the prior study. No acute infarct. Stable chronic infarcts and chronic microvascular ischemic changes. Scattered chronic microhemorrhages are again noted. Ventricles are stable in size. No intracranial mass or abnormal enhancement. Vascular: Major vessel flow voids at the skull base are preserved. Skull and upper cervical spine: Mildly decreased T1 marrow signal likely reflects hematopoietic marrow. Other: Cavernous sinus enhancement is symmetric and unremarkable. Persistent left greater than right mastoid fluid  opacification. Sella is unremarkable. MRI ORBITS FINDINGS Orbits: Bilateral lens replacements. Chronic right retinal detachment. Extraocular muscles are symmetric and unremarkable. No abnormal enhancement of the optic nerve sheath complexes. Visualized sinuses: Trace mucosal thickening. Soft tissues: Unremarkable. IMPRESSION: Evolution of infarcts seen on the prior study. No acute infarction, hemorrhage, or abnormal enhancement. Chronic right retinal detachment.  No acute orbital abnormality. Electronically Signed   By: Macy Mis M.D.   On: 10/07/2021 11:59    Medications:  [START ON 10/11/2021] thiamine injection     thiamine injection 500 mg (10/09/21 1001)    aspirin EC  81 mg Oral Daily   atorvastatin  40 mg Oral Daily   Chlorhexidine Gluconate Cloth  6 each Topical Q0600   cinacalcet  90 mg Oral Q supper   clopidogrel  75 mg Oral Daily   darbepoetin (ARANESP) injection - DIALYSIS  200 mcg Intravenous Q Thu-HD   dorzolamide-timolol  1 drop Left Eye BID   feeding supplement (NEPRO CARB STEADY)  780 mL Per Tube Q24H   heparin injection (subcutaneous)  5,000 Units Subcutaneous Q8H   insulin aspart  0-6 Units Subcutaneous TID WC   insulin glargine-yfgn  16 Units Subcutaneous QHS   lidocaine-prilocaine  1 application Topical UD   mouth rinse  15 mL Mouth Rinse BID   multivitamin  1 tablet Oral QHS   nutrition supplement (JUVEN)  1 packet Per Tube BID BM   pantoprazole sodium  40 mg Oral Daily   sevelamer carbonate  1.6 g Oral TID WC   [START ON 10/15/2021] thiamine  100 mg Oral Daily    Dialysis Orders: [facility] 3h 48min   400/ 600   59.5kg   2/2 bath  Prof 2   RU AV fistula  Hep 4000  Mircera 200 mcg IV every 2 weeks - last 1/26 Ancef 2g 3 times weekly HD Covid neg 2/3, hep B SAg neg 2/4  Assessment/Plan: AMS - acute embolic CVA and/or watershed infarcts.  Repeat MRI with no acute findings.  With PFO on TTE. TEE showed small PFO and no clot. Blood Cx 2/4 negative. S/p course of IV  abx.  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 MRI of orbits/sinuses completed this AM, with no abnormalities of orbit/sinuses noted. Neuro following: plan for LP. Spoke with Neuro at bedside who is planning to initiate possible plasma exchange, will need to discuss with Attending Dr. Hollie Salk. Plan for HD tomorrow. Hx R BKA (2/5 by VVS): At risk for AKA.  VVS following.  To go to CIR at some point.  ESRD - Usually  TTS schedule - Next HD 2/28. Trying to keep SBP > 100 on HD due to #1. Wants to continue HD at this time.  Will need improvement in behavior during HD and tolerate dialysis in chair prior to d/c. Noted last K+ 5.7-noted disruptive behavior during previous HD. CMP already ordered-K+ now 5.1. Sepsis/ shock - resolved  Anemia of CKD- last Hgb stable at 8.7, continue Aranesp 251mcg q Thurs. Transfuse pRBC prn.  MBD: CorrCa high, Phos ok. Continue home sensipar/binders, hold VDRA for now.   HTN/volume - midodrine with HD. UF as tolerated w/ albumin support prn. Below prior EDW, will need to be lowered on d/c. Severe protein calorie malnutrition: NG tube in place with Nepro TF's + supplements. DMT2 - per PMD  Dispo - likely will need SNF placement.  Tobie Poet, NP [facility] 10/09/2021,11:15 AM  LOS: 24 days

## 2021-10-09 NOTE — Progress Notes (Signed)
Physical Therapy Treatment Patient Details Name: [name] MRN: [id number] DOB: [birth year] Today's Date: 10/09/2021   History of Present Illness Pt is a 41 y.o. female admitted 09/15/21 post-HD session with concern for AMS when pt stopped speaking to staff. 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 Pt with increased somnolence 2/6; noted to be more lethargic in HD 2/7, 2/9, 2/11. Head CT 2/10 negative for acute injury; chronic small vessel disease and infarcts. MRI 2/10 with scattered punctate acute infarcts bilateral cerebral hemispheres (may be embolic), potential subacute infarct L corona radiata. Pt with persistent bilateral ptosis (pt reports ongoing ~1 month); awaiting MRI of orbits on 2/25. PMH includes ESRD (HD TTS), DM2, HTN, neuropathy, R eye blindness (2008).    PT Comments    Pt received supine and agreeable to session with good participation and continued mobility progression. Step-by-step instructions along with tactile input for pt to feel her environment surrounding her continues to be beneficial in decreasing pt fear of mobility and increasing her success with bed mobility and sitting balance pre transfer. Up to mod assist needed for bed mobility to elevate trunk and min assist down to min guard needed for pt to balance in sitting EOB for LE exercise to increase ROM and strength. Pt continues to benefit from skilled PT services to progress toward functional mobility goals.    Recommendations for follow up therapy are one component of a multi-disciplinary discharge planning process, led by the attending physician.  Recommendations may be updated based on patient status, additional functional criteria and insurance authorization.  Follow Up Recommendations  Acute inpatient rehab (3hours/day)     Assistance Recommended at Discharge Frequent or constant Supervision/Assistance  Patient can return home with the following Two people to help with  walking and/or transfers;Assistance with cooking/housework;Assistance with feeding;Direct supervision/assist for medications management;Direct supervision/assist for financial management;Assist for transportation;Help with stairs or ramp for entrance;A lot of help with bathing/dressing/bathroom   Equipment Recommendations  BSC/3in1;Wheelchair (measurements PT);Wheelchair cushion (measurements PT);Hospital bed;Other (comment)    Recommendations for Other Services Rehab consult     Precautions / Restrictions Precautions Precautions: Fall Precaution Comments: Rt BKA, new bil ptosis with blindness, bowel incontinence Required Braces or Orthoses: Other Brace Other Brace: RLE limb guard Restrictions Weight Bearing Restrictions: Yes RUE Weight Bearing: Weight bearing as tolerated RLE Weight Bearing: Non weight bearing     Mobility  Bed Mobility Overal bed mobility: Needs Assistance Bed Mobility: Rolling Rolling: Min assist Sidelying to sit: Mod assist   Sit to supine: Mod assist, Max assist Sit to sidelying: Mod assist General bed mobility comments: min assist to roll to L with cues and step by step verbal sequencing and guiding her hands to rails. Mod assist to come to sitting secodary to some fear, but with reassurance and having pt feel PTA in front of her she is able    Transfers Overall transfer level: Needs assistance Equipment used: None Transfers: Sit to/from Stand Sit to Stand: Total assist, From elevated surface           General transfer comment: pt able to initiate sit to partial stand with max A- she was able to lift buttocks 1-2 inches each time x 2 attempts before she fatigued.    Ambulation/Gait                   Stairs             Wheelchair Mobility    Modified Rankin (  Stroke Patients Only)       Balance   Sitting-balance support: Feet supported, Bilateral upper extremity supported Sitting balance-Leahy Scale: Poor Sitting balance  - Comments: pt requires UE support and min A progressing to min guard assist, pt able to sit and maintain balace EOB x15 mins                                    Cognition Arousal/Alertness: Awake/alert Behavior During Therapy: Flat affect Overall Cognitive Status: Impaired/Different from baseline Area of Impairment: Following commands, Problem solving, Attention                 Orientation Level: Person, Place, Time, Situation Current Attention Level: Sustained Memory: Decreased recall of precautions, Decreased short-term memory Following Commands: Follows one step commands with increased time Safety/Judgement: Decreased awareness of safety, Decreased awareness of deficits Awareness: Emergent Problem Solving: Slow processing, Difficulty sequencing, Requires verbal cues, Requires tactile cues          Exercises General Exercises - Lower Extremity Long Arc Quad: AROM, AAROM, Left, 5 reps, Seated Hip Flexion/Marching: AROM, Both, 20 reps, Seated Toe Raises: AROM, Left, 5 reps, Seated    General Comments        Pertinent Vitals/Pain Pain Assessment Pain Assessment: No/denies pain    Home Living                          Prior Function            PT Goals (current goals can now be found in the care plan section) Acute Rehab PT Goals PT Goal Formulation: With patient Time For Goal Achievement: 10/14/21    Frequency    Min 4X/week      PT Plan Current plan remains appropriate    Co-evaluation              AM-PAC PT "6 Clicks" Mobility   Outcome Measure  Help needed turning from your back to your side while in a flat bed without using bedrails?: A Little Help needed moving from lying on your back to sitting on the side of a flat bed without using bedrails?: A Little Help needed moving to and from a bed to a chair (including a wheelchair)?: Total Help needed standing up from a chair using your arms (e.g., wheelchair or  bedside chair)?: Total Help needed to walk in hospital room?: Total Help needed climbing 3-5 steps with a railing? : Total 6 Click Score: 10    End of Session Equipment Utilized During Treatment: Gait belt Activity Tolerance: Patient tolerated treatment well Patient left: in bed;with call bell/phone within reach;with bed alarm set Nurse Communication: Mobility status PT Visit Diagnosis: Other abnormalities of gait and mobility (R26.89);Muscle weakness (generalized) (M62.81);Difficulty in walking, not elsewhere classified (R26.2);Other symptoms and signs involving the nervous system (R29.898) Pain - Right/Left: Right Pain - part of body: Leg     Time: 4496-7591 PT Time Calculation (min) (ACUTE ONLY): 26 min  Charges:  $Therapeutic Activity: 23-37 mins                     Haeleigh Streiff R. PTA Acute Rehabilitation Services Office: [place] 10/09/2021, 3:32 PM

## 2021-10-09 NOTE — Progress Notes (Signed)
Progress Note   Patient: [name] GQQ:[id number] DOB: 09/05/1980 DOA: 09/15/2021     24 DOS: the patient was seen and examined on 10/09/2021   Brief hospital course: 41 year old female with history of ESRD on dialysis Tuesdays Thursdays and Saturday, poorly controlled type 2 diabetes, hypertension she was initially sent in from dialysis session due to concern for altered mental status and lethargy.  She had a transmetatarsal amputation of the right foot 5 weeks prior to admission which grew MSSA and was receiving Ancef at dialysis.  X-ray of the right stump suggested osteomyelitis vascular surgery consulted recommended a below-knee amputation and she underwent BKA on 09/17/2021. From 09/18/2021 patient kept her eyes closed and was somnolent and was not able to or refusing to open her eyes.  On 09/22/2021 rapid response called for lethargy and minimally responsive to sternal rub PCCM and neurology were consulted.  MRI obtained shows scattered punctuate acute infarcts bilaterally and confluent region of the left corona radiata.  CT angiogram of the head and neck showed no large vessel occlusion.  EEG showed diffuse encephalopathy.  She was given pressors overnight and started antibiotics for possible endocarditis and was started on aspirin.  Echocardiogram shows positive for shunting and bicuspid aortic valve. She had a core track tube placed and feedings were started.    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  Patient followed by neurology. Transesophageal echo on 10/02/2021 showed small PFO with no thrombus.  On aspirin 325 mg daily.  Patient was started on Rocephin for possible endocarditis which has been stopped on 09/27/2021 as cultures remain negative. CIR following.  #2 type 2 diabetes with hypoglycemia increase the dose of Semglee to 18 units..  No further hypoglycemia. CBG (last 3)  Recent Labs    10/09/21 0051 10/09/21 0351 10/09/21 0801  GLUCAP 172* 240* 232*      #3 end-stage renal disease on hemodialysis Tuesday Thursday and Saturday Patient is repeatedly  disruptive in the dialysis unit.  Constantly yelling and calling out for attention per nurse.    #4 hypotension resolved midodrine Dc'd  #5 moderate malnutrition due to acute illness as evidenced by fat and muscle depletion.  Continue Nepro shakes.  BMI 23.66  Patient requesting Dobbhoff tube to be removed as she cannot breathe through the nose.  Will obtain calorie count.  Discussed with the staff to watch and make sure that she is eating her food and not the husband.  According to the husband patient ate 100% of her meals yesterday.  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  Concern for eschar formation in the right stump.  Patient now likely will need above-knee amputation.  #7 b/l ptosis-unclear etiology.  MRI of her orbits and cavernous sinuses does not show any acute pathology.  Neuro is doing extensive work-up.   Per Dr. Cheral Marker   Will need LP for cell count with differential, protein, glucose, cytology, IgG index, oligoclonal bands and VDRL. Will need to obtain in the AM on a weekday as the cytology sample must be sent immediately to the lab for processing, which does not occur on the weekends for cytology samples. Also will need to have subcutaneous heparin discontinued overnight prior to LP due to bleeding risk. Will need to be placed on SCDs while off SQ heparin.  - Thiamine level (ordered) - After thiamine level is drawn, start high-dose IV thiamine at 500 mg TID x 3 days, followed by 250  mg mg TID x 3 days, then switch to 100 mg po qd thereafter - Myasthenia gravis serum panel (ordered) - Anti-GQ1b, anti-MOG and anti GM1 antibodies. (Ordered as [facility] sendout) - Anti-NMO antibody (ordered) - Will need outpatient EMG/NCS after discharge. Will need outpatient Neurology follow up with [facility] for this.  - Consider 30 day  cardiac event monitoring as outpt  #8 goals of care patient is full code and is getting dialysis 3 times a week.  However she has multiple comorbidities even though she is young.  Palliative had been consulted, and signed off.      Subjective:   Patient is resting in bed, husband is in the room.  She is requesting the Dobbhoff tube to be removed as she feels she cannot breathe through the tube in her nose.  physical Exam: Vitals:   10/08/21 2145 10/09/21 0341 10/09/21 0515 10/09/21 0802  BP: 135/70 122/67  (!) 96/33  Pulse: 92 90  (!) 103  Resp: 20 18  16   Temp: 99 F (37.2 C) 98.2 F (36.8 C)  99.3 F (37.4 C)  TempSrc: Oral Oral  Oral  SpO2: 100% 100%  99%  Weight:   61 kg   Height:       General exam: in nad   HEENT:  no sclera icterus or thrush Respiratory system: Clear to auscultation. Respiratory effort normal. Cardiovascular system: S1 & S2 heard, regular rate and rhythm Gastrointestinal system: Abdomen soft, non-tender, nondistended. Normal bowel sounds   Central nervous system: keeps eyes closes, answers questions and follows commands Extremities:right bka staples in place cdi Skin: No rashes or ulcers Psychiatry:  Mood & affect appropriate.   Data Reviewed:mag 2.4 phos 4.6   Family Communication: none   Disposition:cir vs snf  Status is: Inpatient   Planned Discharge Destination: Skilled nursing facility  Dvt prophylaxis heparin  Time spent: 42 min minutes  Author: Georgette Shell, MD 10/09/2021 10:18 AM  For on call review [email or web address].

## 2021-10-09 NOTE — Progress Notes (Addendum)
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 Able to have conversation but appears lethargic.  Objective: Current vital signs: BP 116/65 (BP Location: Left Arm)    Pulse 96    Temp 98.3 F (36.8 C) (Oral)    Resp 15    Ht 5\' 1"  (1.549 m)    Wt 61 kg    SpO2 100%    BMI 25.41 kg/m  Vital signs in last 24 hours: Temp:  [98.2 F (36.8 C)-99.3 F (37.4 C)] 98.3 F (36.8 C) (02/27 1032) Pulse Rate:  [90-103] 96 (02/27 1032) Resp:  [15-20] 15 (02/27 1032) BP: (96-135)/(33-70) 116/65 (02/27 1032) SpO2:  [99 %-100 %] 100 % (02/27 1032) Weight:  [61 kg] 61 kg (02/27 0515)  Intake/Output from previous day: 02/26 0701 - 02/27 0700 In: 1741.3 [P.O.:390; NG/GT:1251.3; IV Piggyback:100] Out: -  Intake/Output this shift: Total I/O In: 63 [P.O.:60; I.V.:3] Out: -  Nutritional status:  Diet Order             DIET DYS 2 Room service appropriate? No; Fluid consistency: Thin  Diet effective now                  HEENT: [place]/AT Lungs: Respirations unlabored Ext: Right BKA  Neurologic Exam: Ment: Awake but lethargic. Oriented to the city, state, day of the week, month and year. Speech is fluent but dysarthric in the context of bilateral jaw, tongue and lip weakness as well as right facial droop. Intact comprehension and naming but unable to see colors very well. Able to follow all commands. Good insight.  CN:  II: Right pupil irregular and unreactive, 3 mm (blind in right eye). Left pupil round, 3 mm, with no discernible reactivity to penlight. Left eye with moderate vision loss; able to identify a pen and thumb visually and able identify number of fingers held up.   III, IV, VI:  Right eye is center but unable to move in any direction. Left eye also with impaired motility but less severe than on the right. Able to gaze almost fully to the left (abduction) and able to adduct slightly. Able to supraduct and infraduct minimally. No nystagmus.  Lids: Profound ptosis is seen  bilaterally. Unable to open eyelids with own power. Eyes fully closed at baseline.  V: Intact to FT bilaterally VII: Weak eyelid closure bilaterally; cannot clench eyelids tightly shut, but they do close fully. Right facial droop (recent stroke) on a background of diffuse CNVII weakness bilaterally in upper and lower quadrants of the face, to the extent that lip weakness compromises her speech.  VIII: Intact to conversation.  IX-X: unable to fully open mouth for assessment. Mild hypophonia noted.  XII: Weak tongue protrusion.  Motor: 4/5 BUE proximally and distally.  RLE: HF 3/5, KE 3/5 LLE: HF 3/5, KE 1/5, KF 3/5, ADF/APF 1/5 Sensory: Intact and symmetric to FT x 4 Reflexes: Hypoactive throughout Cerebellar: No ataxia with FNF bilaterally, but with optic ataxia (severe difficulty targeting objects visually with both hands) Gait: Unable to assess  Lab Results: Results for orders placed or performed during the hospital encounter of 09/15/21 (from the past 48 hour(s))  CBC     Status: Abnormal   Collection Time: 10/07/21 11:52 AM  Result Value Ref Range   WBC 9.7 4.0 - 10.5 K/uL   RBC 3.04 (L) 3.87 - 5.11 MIL/uL   Hemoglobin 8.7 (L) 12.0 - 15.0 g/dL   HCT 30.0 (L) 36.0 - 46.0 %   MCV 98.7  80.0 - 100.0 fL   MCH 28.6 26.0 - 34.0 pg   MCHC 29.0 (L) 30.0 - 36.0 g/dL   RDW 25.1 (H) 11.5 - 15.5 %   Platelets 298 150 - 400 K/uL   nRBC 0.4 (H) 0.0 - 0.2 %    Comment: Performed at [facility] [address] [place], [place] [address]  Renal function panel     Status: Abnormal   Collection Time: 10/07/21 11:52 AM  Result Value Ref Range   Sodium 131 (L) 135 - 145 mmol/L   Potassium 5.7 (H) 3.5 - 5.1 mmol/L   Chloride 92 (L) 98 - 111 mmol/L   CO2 27 22 - 32 mmol/L   Glucose, Bld 188 (H) 70 - 99 mg/dL    Comment: Glucose reference range applies only to samples taken after fasting for at least 8 hours.   BUN 84 (H) 6 - 20 mg/dL   Creatinine, Ser 7.81 (H) 0.44 - 1.00 mg/dL   Calcium  10.2 8.9 - 10.3 mg/dL   Phosphorus 5.0 (H) 2.5 - 4.6 mg/dL   Albumin 2.1 (L) 3.5 - 5.0 g/dL   GFR, Estimated 6 (L) >60 mL/min    Comment: (NOTE) Calculated using the CKD-EPI Creatinine Equation (2021)    Anion gap 12 5 - 15    Comment: Performed at [facility] [address] [place], [place] [address]  Glucose, capillary     Status: Abnormal   Collection Time: 10/07/21 12:22 PM  Result Value Ref Range   Glucose-Capillary 175 (H) 70 - 99 mg/dL    Comment: Glucose reference range applies only to samples taken after fasting for at least 8 hours.  Glucose, capillary     Status: Abnormal   Collection Time: 10/07/21  6:00 PM  Result Value Ref Range   Glucose-Capillary 102 (H) 70 - 99 mg/dL    Comment: Glucose reference range applies only to samples taken after fasting for at least 8 hours.  Glucose, capillary     Status: None   Collection Time: 10/07/21  8:20 PM  Result Value Ref Range   Glucose-Capillary 93 70 - 99 mg/dL    Comment: Glucose reference range applies only to samples taken after fasting for at least 8 hours.  Glucose, capillary     Status: Abnormal   Collection Time: 10/08/21 12:52 AM  Result Value Ref Range   Glucose-Capillary 139 (H) 70 - 99 mg/dL    Comment: Glucose reference range applies only to samples taken after fasting for at least 8 hours.  Glucose, capillary     Status: Abnormal   Collection Time: 10/08/21  3:41 AM  Result Value Ref Range   Glucose-Capillary 117 (H) 70 - 99 mg/dL    Comment: Glucose reference range applies only to samples taken after fasting for at least 8 hours.  Glucose, capillary     Status: Abnormal   Collection Time: 10/08/21  7:57 AM  Result Value Ref Range   Glucose-Capillary 186 (H) 70 - 99 mg/dL    Comment: Glucose reference range applies only to samples taken after fasting for at least 8 hours.  Glucose, capillary     Status: Abnormal   Collection Time: 10/08/21 11:31 AM  Result Value Ref Range   Glucose-Capillary 138  (H) 70 - 99 mg/dL    Comment: Glucose reference range applies only to samples taken after fasting for at least 8 hours.  Glucose, capillary     Status: Abnormal  Collection Time: 10/08/21  4:16 PM  Result Value Ref Range   Glucose-Capillary 163 (H) 70 - 99 mg/dL    Comment: Glucose reference range applies only to samples taken after fasting for at least 8 hours.  Glucose, capillary     Status: Abnormal   Collection Time: 10/08/21  7:44 PM  Result Value Ref Range   Glucose-Capillary 108 (H) 70 - 99 mg/dL    Comment: Glucose reference range applies only to samples taken after fasting for at least 8 hours.  Glucose, capillary     Status: Abnormal   Collection Time: 10/09/21 12:51 AM  Result Value Ref Range   Glucose-Capillary 172 (H) 70 - 99 mg/dL    Comment: Glucose reference range applies only to samples taken after fasting for at least 8 hours.  Glucose, capillary     Status: Abnormal   Collection Time: 10/09/21  3:51 AM  Result Value Ref Range   Glucose-Capillary 240 (H) 70 - 99 mg/dL    Comment: Glucose reference range applies only to samples taken after fasting for at least 8 hours.  Glucose, capillary     Status: Abnormal   Collection Time: 10/09/21  8:01 AM  Result Value Ref Range   Glucose-Capillary 232 (H) 70 - 99 mg/dL    Comment: Glucose reference range applies only to samples taken after fasting for at least 8 hours.    No results found for this or any previous visit (from the past 240 hour(s)).  Lipid Panel No results for input(s): CHOL, TRIG, HDL, CHOLHDL, VLDL, LDLCALC in the last 72 hours.  Studies/Results: MR BRAIN W WO CONTRAST  Result Date: 10/07/2021 CLINICAL DATA:  Ophthalmoplegia EXAM: MRI HEAD AND ORBITS WITHOUT AND WITH CONTRAST TECHNIQUE: Multiplanar, multiecho pulse sequences of the brain and surrounding structures were obtained without and with intravenous contrast. Multiplanar, multiecho pulse sequences of the orbits and surrounding structures were  obtained including fat saturation techniques, before and after intravenous contrast administration. CONTRAST:  14mL GADAVIST GADOBUTROL 1 MMOL/ML IV SOLN COMPARISON:  MRI brain 09/22/2021 FINDINGS: MRI HEAD FINDINGS Brain: Evolution of infarcts seen on the prior study. No acute infarct. Stable chronic infarcts and chronic microvascular ischemic changes. Scattered chronic microhemorrhages are again noted. Ventricles are stable in size. No intracranial mass or abnormal enhancement. Vascular: Major vessel flow voids at the skull base are preserved. Skull and upper cervical spine: Mildly decreased T1 marrow signal likely reflects hematopoietic marrow. Other: Cavernous sinus enhancement is symmetric and unremarkable. Persistent left greater than right mastoid fluid opacification. Sella is unremarkable. MRI ORBITS FINDINGS Orbits: Bilateral lens replacements. Chronic right retinal detachment. Extraocular muscles are symmetric and unremarkable. No abnormal enhancement of the optic nerve sheath complexes. Visualized sinuses: Trace mucosal thickening. Soft tissues: Unremarkable. IMPRESSION: Evolution of infarcts seen on the prior study. No acute infarction, hemorrhage, or abnormal enhancement. Chronic right retinal detachment.  No acute orbital abnormality. Electronically Signed   By: Macy Mis M.D.   On: 10/07/2021 11:59    Medications: Scheduled:  aspirin EC  81 mg Oral Daily   atorvastatin  40 mg Oral Daily   Chlorhexidine Gluconate Cloth  6 each Topical Q0600   cinacalcet  90 mg Oral Q supper   clopidogrel  75 mg Oral Daily   darbepoetin (ARANESP) injection - DIALYSIS  200 mcg Intravenous Q Thu-HD   dorzolamide-timolol  1 drop Left Eye BID   feeding supplement (NEPRO CARB STEADY)  780 mL Per Tube Q24H   heparin injection (subcutaneous)  5,000  Units Subcutaneous Q8H   insulin aspart  0-6 Units Subcutaneous TID WC   insulin glargine-yfgn  16 Units Subcutaneous QHS   lidocaine-prilocaine  1 application  Topical UD   mouth rinse  15 mL Mouth Rinse BID   multivitamin  1 tablet Oral QHS   nutrition supplement (JUVEN)  1 packet Per Tube BID BM   pantoprazole sodium  40 mg Oral Daily   sevelamer carbonate  1.6 g Oral TID WC   [START ON 10/15/2021] thiamine  100 mg Oral Daily    Assessment: 41 y.o. female with history of ESRD on HD T/Th/Sat, type 2 diabetes mellitus with neuropathy, hypertension, osteomyelitis with BKA, right eye blindness, and medical non-adherence presenting with  lethargy and AMS following HD on 2/3.  Initial work up in the ED revealed patient with leukocytosis concerning for infection and imaging with concern for osteomyelitis for which vascular surgery recommended admission for BKA on 2/5 and ongoing antibiotics. Of note, patient was discharged in January following urgent transmetatarsal amputation after being found with gangrenous toes s/p wound vac and IV Ancef after wound cultures grew MSSA. During hospitalization, patient's mental status initially improved and she was responding appropriately on evaluation on 2/4 with surgery for BKA on 2/5. On 2/6, she was noted to be somnolent and slow to respond but she was answering questions appropriately. On 2/7 patient had an episode of hypotension with HD but was noted to still be lethargic but followed commands and answered questions appropriately. Patient again had HD on 2/9 and became more lethargic, patient lethargic on exam during HD on 2/11. TEE completed- no thrombus detected, small PFO noted. Start atorvastatin, ASA 81mg  and plavix 75mg . No longer encephalopathic. Inpatient stroke work up completed.      - Bilateral ptosis Patient has not been able to open eyes for quite some time. She states that this has been ongoing for about the past month. She endorses total loss of vision in her right eye that is and severe loss of vision in her left eye which she estimates has been present for about 1 month as well. even follows all simple  commands and answer question appropriately.  Exam confirms complete blindness OD and severe loss of visual acuity OS. Also with severe bilateral ptosis that localizes to the 3rd cranial nerve. Right pupil irregular and 2-3 mm with no reactivity. Left pupil with minimal sluggish reactivity, irregular and 3 mm. Ocular motility is severely impaired; she is exotropic with right eye deviated laterally and only about 3 mm of medial rotation of the globe when asked to look to the left. Left eye with severely decreased motility than right eye, but less profound than on the right; left eye partially abducted with about 4 mm of movement medially and about 2 mm of movement in upward and downward gaze in the context of poor patient cooperation. Overall findings best localize to the cavernous sinuses bilaterally, possibly secondary to an inflammatory process (possibly severe manifestations of Tolosa-Hunt syndrome, an infiltrative process or bilateral cavernous sinus thrombosis). She also states that touching her left eyelid and orbital rim during exam is painful, which would be compatible with the above DDx.  DDx: Eye opening apraxia is felt to be unlikely. Cranial polyneuropathy including CN III IV VI is possible. Miller-Fisher variant of GBS is felt to be higher on the DDx given negative MRI brain and orbits (see below). Atypical predominantly bulbar presentation of AIDP is also possible; her BLE weakness and hyporeflexia further support this  possibility. Critical illness neuropathy/myopathy would not be expected to present disproportionately with eye involvement but this is also possible. Bulbar myopathy or idiopathic cranial polyneuropathy also on the DDx. Botulism and tick paralysis are unlikely as she has not had any dyspnea; however, the pupil in her left eye has no discernible reactivity, which would be expected for botulism (right eye with prior retinal detachment) and she also has swallowing difficulty requiring  pureed diet. An atypical presentation of myasthenia gravis is also possible. NMO is on the DDx given BLE weakness and vision loss.  MRI brain  with and without contrast performed yesterday (2/25): Repeat MRI brain with and without contrast (2/25): There is expected evolution of the  infarcts seen on the prior study. No acute infarct. Stable chronic infarcts and chronic microvascular ischemic changes. Scattered chronic microhemorrhages are again noted. Ventricles are stable in size. No intracranial mass or abnormal enhancement to explain the patient's bilateral ptosis and ophthalmoplegia. Cavernous sinus enhancement is symmetric and unremarkable. Persistent left greater than right mastoid fluid opacification. Sella is unremarkable. MRI orbits with and without contrast performed yesterday (2/25): Bilateral lens replacements. Chronic right retinal detachment. Extraocular muscles are symmetric and unremarkable. No abnormal enhancement of the optic nerve sheath complexes.  6. Impression: Given no cavernous sinus pathology identified on MRI to explain the patient's severe bilateral ptosis and ophthalmoparesis, rare potential etiologies will now need to be considered (in addition to above DDx). 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 Her ophthalmoparesis and possible cognitive impairment on exam (she is poorly cooperative, making determination of cognitive impairment difficult) would make thiamine deficiency more likely. Unfortunately, ataxia cannot be tested as she is noncompliant with this and several other components of the exam. Will need thiamine level followed by empiric thiamine as well as an LP. Other work up for her ptosis and ophthalmoparesis is beyond the scope of this inpatient admission and she will need to be evaluated outpatient by a Neurophthalmologist (Dr. Michel Bickers of [facility] is recommended:  [phone]).   Recommendations: - Initially planning for LP but patient is on aspirin and plavix and plavix will need to be held for 5 days before we can safely pursue an LP. 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 Will need to seek help from nephrology regarding replacement fluid for PLEX. - Thiamine level (ordered) - After thiamine level is drawn, start high-dose IV thiamine at 500 mg TID x 3 days, followed by 250 mg mg TID x 3 days, then switch to 100 mg po qd thereafter - Myasthenia gravis serum panel (ordered) - Anti-GQ1b, anti-MOG and anti GM1 antibodies. (Ordered as [facility] sendout) - Anti-NMO antibody (ordered) - Will need outpatient EMG/NCS after discharge. Will need outpatient Neurology follow up with [facility] for this.  - Consider 30 day cardiac event monitoring as outpt.   LOS: 24 days   France Ravens, MD PGY1 Resident 10/09/2021  10:59 AM

## 2021-10-10 DIAGNOSIS — M869 Osteomyelitis, unspecified: Secondary | ICD-10-CM | POA: Diagnosis not present

## 2021-10-10 LAB — GLUCOSE, CAPILLARY
Glucose-Capillary: 104 mg/dL — ABNORMAL HIGH (ref 70–99)
Glucose-Capillary: 109 mg/dL — ABNORMAL HIGH (ref 70–99)
Glucose-Capillary: 121 mg/dL — ABNORMAL HIGH (ref 70–99)
Glucose-Capillary: 208 mg/dL — ABNORMAL HIGH (ref 70–99)
Glucose-Capillary: 234 mg/dL — ABNORMAL HIGH (ref 70–99)
Glucose-Capillary: 252 mg/dL — ABNORMAL HIGH (ref 70–99)

## 2021-10-10 LAB — RENAL FUNCTION PANEL
Albumin: 2.1 g/dL — ABNORMAL LOW (ref 3.5–5.0)
Anion gap: 12 (ref 5–15)
BUN: 110 mg/dL — ABNORMAL HIGH (ref 6–20)
CO2: 24 mmol/L (ref 22–32)
Calcium: 10.7 mg/dL — ABNORMAL HIGH (ref 8.9–10.3)
Chloride: 92 mmol/L — ABNORMAL LOW (ref 98–111)
Creatinine, Ser: 7.61 mg/dL — ABNORMAL HIGH (ref 0.44–1.00)
GFR, Estimated: 6 mL/min — ABNORMAL LOW (ref >60)
Glucose, Bld: 184 mg/dL — ABNORMAL HIGH (ref 70–99)
Phosphorus: 3.9 mg/dL (ref 2.5–4.6)
Potassium: 5.5 mmol/L — ABNORMAL HIGH (ref 3.5–5.1)
Sodium: 128 mmol/L — ABNORMAL LOW (ref 135–145)

## 2021-10-10 LAB — CBC
HCT: 29.4 % — ABNORMAL LOW (ref 36.0–46.0)
Hemoglobin: 8.6 g/dL — ABNORMAL LOW (ref 12.0–15.0)
MCH: 29.1 pg (ref 26.0–34.0)
MCHC: 29.3 g/dL — ABNORMAL LOW (ref 30.0–36.0)
MCV: 99.3 fL (ref 80.0–100.0)
Platelets: 310 10*3/uL (ref 150–400)
RBC: 2.96 MIL/uL — ABNORMAL LOW (ref 3.87–5.11)
RDW: 23.6 % — ABNORMAL HIGH (ref 11.5–15.5)
WBC: 8.8 10*3/uL (ref 4.0–10.5)
nRBC: 0 % (ref 0.0–0.2)

## 2021-10-10 MED ORDER — JUVEN PO PACK
1.0000 | PACK | Freq: Two times a day (BID) | ORAL | Status: DC
Start: 2021-10-10 — End: 2021-10-17
  Administered 2021-10-12 – 2021-10-17 (×7): 1 via ORAL
  Filled 2021-10-10 (×5): qty 1

## 2021-10-10 NOTE — Progress Notes (Addendum)
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 Able to have conversation but patient reports still feeling fairly lethargic.  Objective: Current vital signs: BP (!) 154/75 (BP Location: Left Arm)    Pulse 98    Temp 98.5 F (36.9 C) (Oral)    Resp 17    Ht 5\' 1"  (1.549 m)    Wt 59.2 kg    SpO2 99%    BMI 24.66 kg/m  Vital signs in last 24 hours: Temp:  [98.2 F (36.8 C)-98.6 F (37 C)] 98.5 F (36.9 C) (02/28 0800) Pulse Rate:  [90-98] 98 (02/28 0800) Resp:  [16-17] 17 (02/28 0800) BP: (148-155)/(74-78) 154/75 (02/28 0800) SpO2:  [99 %-100 %] 99 % (02/28 0800) Weight:  [59.2 kg] 59.2 kg (02/28 0500)  Intake/Output from previous day: 02/27 0701 - 02/28 0700 In: 63 [P.O.:60; I.V.:3] Out: -  Intake/Output this shift: No intake/output data recorded. Nutritional status:  Diet Order             DIET DYS 2 Room service appropriate? No; Fluid consistency: Thin  Diet effective now                  HEENT: [place]/AT Lungs: Respirations unlabored Ext: Right BKA  Neurologic Exam: Ment: Awake but lethargic. Oriented to the city, state, day of the week, month and year. Speech is fluent but dysarthric in the context of bilateral jaw, tongue and lip weakness as well as right facial droop. Intact comprehension and naming but unable to see colors very well. Able to follow all commands. Good insight.  CN:  II: Right pupil irregular and unreactive, 3 mm (blind in right eye). Left pupil round, 3 mm, with no discernible reactivity to penlight. Left eye with moderate vision loss; able to identify a pen and thumb visually and able identify number of fingers held up.   III, IV, VI:  Right eye is center but unable to move in any direction. Left eye also with impaired motility but less severe than on the right. Able to gaze almost fully to the left (abduction) and able to adduct slightly. Able to supraduct and infraduct minimally. No nystagmus.  Lids: Profound ptosis is seen  bilaterally. Unable to open eyelids with own power. Eyes fully closed at baseline.  V: Intact to FT bilaterally VII: Weak eyelid closure bilaterally; cannot clench eyelids tightly shut, but they do close fully. Right facial droop (recent stroke) on a background of diffuse CNVII weakness bilaterally in upper and lower quadrants of the face, to the extent that lip weakness compromises her speech.  VIII: Intact to conversation.  IX-X: unable to fully open mouth for assessment. Mild hypophonia noted.  XII: Weak tongue protrusion.  Motor: 4/5 BUE proximally and distally.  RLE: HF 3/5, KE 3/5 LLE: HF 3/5, KE 1/5, KF 3/5, ADF/APF 1/5 Sensory: Intact and symmetric to FT x 4 Reflexes: Hypoactive throughout Cerebellar: No ataxia with FNF bilaterally, but with optic ataxia (severe difficulty targeting objects visually with both hands) Gait: Unable to assess  Lab Results: Results for orders placed or performed during the hospital encounter of 09/15/21 (from the past 48 hour(s))  Glucose, capillary     Status: Abnormal   Collection Time: 10/08/21 11:31 AM  Result Value Ref Range   Glucose-Capillary 138 (H) 70 - 99 mg/dL    Comment: Glucose reference range applies only to samples taken after fasting for at least 8 hours.  Glucose, capillary     Status: Abnormal   Collection  Time: 10/08/21  4:16 PM  Result Value Ref Range   Glucose-Capillary 163 (H) 70 - 99 mg/dL    Comment: Glucose reference range applies only to samples taken after fasting for at least 8 hours.  Glucose, capillary     Status: Abnormal   Collection Time: 10/08/21  7:44 PM  Result Value Ref Range   Glucose-Capillary 108 (H) 70 - 99 mg/dL    Comment: Glucose reference range applies only to samples taken after fasting for at least 8 hours.  Glucose, capillary     Status: Abnormal   Collection Time: 10/09/21 12:51 AM  Result Value Ref Range   Glucose-Capillary 172 (H) 70 - 99 mg/dL    Comment: Glucose reference range applies only to  samples taken after fasting for at least 8 hours.  Glucose, capillary     Status: Abnormal   Collection Time: 10/09/21  3:51 AM  Result Value Ref Range   Glucose-Capillary 240 (H) 70 - 99 mg/dL    Comment: Glucose reference range applies only to samples taken after fasting for at least 8 hours.  Glucose, capillary     Status: Abnormal   Collection Time: 10/09/21  8:01 AM  Result Value Ref Range   Glucose-Capillary 232 (H) 70 - 99 mg/dL    Comment: Glucose reference range applies only to samples taken after fasting for at least 8 hours.  Glucose, capillary     Status: Abnormal   Collection Time: 10/09/21 11:26 AM  Result Value Ref Range   Glucose-Capillary 186 (H) 70 - 99 mg/dL    Comment: Glucose reference range applies only to samples taken after fasting for at least 8 hours.  CBC     Status: Abnormal   Collection Time: 10/09/21 12:04 PM  Result Value Ref Range   WBC 9.5 4.0 - 10.5 K/uL   RBC 2.94 (L) 3.87 - 5.11 MIL/uL   Hemoglobin 8.6 (L) 12.0 - 15.0 g/dL   HCT 29.1 (L) 36.0 - 46.0 %   MCV 99.0 80.0 - 100.0 fL   MCH 29.3 26.0 - 34.0 pg   MCHC 29.6 (L) 30.0 - 36.0 g/dL   RDW 23.9 (H) 11.5 - 15.5 %   Platelets 287 150 - 400 K/uL   nRBC 0.5 (H) 0.0 - 0.2 %    Comment: Performed at [facility] [address] [place], [place] [address]  Comprehensive metabolic panel     Status: Abnormal   Collection Time: 10/09/21 12:04 PM  Result Value Ref Range   Sodium 133 (L) 135 - 145 mmol/L   Potassium 5.1 3.5 - 5.1 mmol/L   Chloride 96 (L) 98 - 111 mmol/L   CO2 27 22 - 32 mmol/L   Glucose, Bld 223 (H) 70 - 99 mg/dL    Comment: Glucose reference range applies only to samples taken after fasting for at least 8 hours.   BUN 84 (H) 6 - 20 mg/dL   Creatinine, Ser 6.52 (H) 0.44 - 1.00 mg/dL   Calcium 10.5 (H) 8.9 - 10.3 mg/dL   Total Protein 7.3 6.5 - 8.1 g/dL   Albumin 2.1 (L) 3.5 - 5.0 g/dL   AST 20 15 - 41 U/L   ALT 15 0 - 44 U/L   Alkaline Phosphatase 96 38 - 126 U/L   Total  Bilirubin 0.3 0.3 - 1.2 mg/dL   GFR, Estimated 8 (L) >60 mL/min    Comment: (NOTE) Calculated using the CKD-EPI Creatinine Equation (2021)    Anion  gap 10 5 - 15    Comment: Performed at [facility], [place] [address] [place], [place] [address]  Glucose, capillary     Status: Abnormal   Collection Time: 10/09/21  4:15 PM  Result Value Ref Range   Glucose-Capillary 198 (H) 70 - 99 mg/dL    Comment: Glucose reference range applies only to samples taken after fasting for at least 8 hours.  Glucose, capillary     Status: Abnormal   Collection Time: 10/09/21  7:26 PM  Result Value Ref Range   Glucose-Capillary 158 (H) 70 - 99 mg/dL    Comment: Glucose reference range applies only to samples taken after fasting for at least 8 hours.  Glucose, capillary     Status: Abnormal   Collection Time: 10/09/21 11:34 PM  Result Value Ref Range   Glucose-Capillary 132 (H) 70 - 99 mg/dL    Comment: Glucose reference range applies only to samples taken after fasting for at least 8 hours.  Glucose, capillary     Status: Abnormal   Collection Time: 10/10/21  5:08 AM  Result Value Ref Range   Glucose-Capillary 252 (H) 70 - 99 mg/dL    Comment: Glucose reference range applies only to samples taken after fasting for at least 8 hours.  Glucose, capillary     Status: Abnormal   Collection Time: 10/10/21  8:03 AM  Result Value Ref Range   Glucose-Capillary 208 (H) 70 - 99 mg/dL    Comment: Glucose reference range applies only to samples taken after fasting for at least 8 hours.  Glucose, capillary     Status: Abnormal   Collection Time: 10/10/21 11:11 AM  Result Value Ref Range   Glucose-Capillary 234 (H) 70 - 99 mg/dL    Comment: Glucose reference range applies only to samples taken after fasting for at least 8 hours.    No results found for this or any previous visit (from the past 240 hour(s)).  Lipid Panel No results for input(s): CHOL, TRIG, HDL, CHOLHDL, VLDL, LDLCALC in the last 72  hours.  Studies/Results: No results found.  Medications: Scheduled:  aspirin EC  81 mg Oral Daily   atorvastatin  40 mg Oral Daily   Chlorhexidine Gluconate Cloth  6 each Topical Q0600   cinacalcet  90 mg Oral Q supper   clopidogrel  75 mg Oral Daily   darbepoetin (ARANESP) injection - DIALYSIS  200 mcg Intravenous Q Thu-HD   dorzolamide-timolol  1 drop Left Eye BID   feeding supplement (NEPRO CARB STEADY)  780 mL Per Tube Q24H   heparin injection (subcutaneous)  5,000 Units Subcutaneous Q8H   insulin aspart  0-6 Units Subcutaneous TID WC   insulin glargine-yfgn  18 Units Subcutaneous QHS   lidocaine-prilocaine  1 application Topical UD   mouth rinse  15 mL Mouth Rinse BID   multivitamin  1 tablet Oral QHS   nutrition supplement (JUVEN)  1 packet Per Tube BID BM   pantoprazole sodium  40 mg Oral Daily   sevelamer carbonate  1.6 g Oral TID WC   [START ON 10/15/2021] thiamine  100 mg Oral Daily    Assessment: 41 y.o. female with history of ESRD on HD T/Th/Sat, type 2 diabetes mellitus with neuropathy, hypertension, osteomyelitis with BKA, right eye blindness, and medical non-adherence presenting with  lethargy and AMS following HD on 2/3.  Initial work up in the ED revealed patient with leukocytosis concerning for infection and imaging with concern for osteomyelitis for  which vascular surgery recommended admission for BKA on 2/5 and ongoing antibiotics. Of note, patient was discharged in January following urgent transmetatarsal amputation after being found with gangrenous toes s/p wound vac and IV Ancef after wound cultures grew MSSA. During hospitalization, patient's mental status initially improved and she was responding appropriately on evaluation on 2/4 with surgery for BKA on 2/5. On 2/6, she was noted to be somnolent and slow to respond but she was answering questions appropriately. On 2/7 patient had an episode of hypotension with HD but was noted to still be lethargic but followed  commands and answered questions appropriately. Patient again had HD on 2/9 and became more lethargic, patient lethargic on exam during HD on 2/11. TEE completed- no thrombus detected, small PFO noted. Start atorvastatin, ASA 81mg  and plavix 75mg . No longer encephalopathic. Inpatient stroke work up completed.      - Bilateral ptosis Patient has not been able to open eyes for quite some time. She states that this has been ongoing for about the past month. She endorses total loss of vision in her right eye that is and severe loss of vision in her left eye which she estimates has been present for about 1 month as well. even follows all simple commands and answer question appropriately.  Exam confirms complete blindness OD and severe loss of visual acuity OS. Also with severe bilateral ptosis that localizes to the 3rd cranial nerve. Right pupil irregular and 2-3 mm with no reactivity. Left pupil with minimal sluggish reactivity, irregular and 3 mm. Ocular motility is severely impaired; she is exotropic with right eye deviated laterally and only about 3 mm of medial rotation of the globe when asked to look to the left. Left eye with severely decreased motility than right eye, but less profound than on the right; left eye partially abducted with about 4 mm of movement medially and about 2 mm of movement in upward and downward gaze in the context of poor patient cooperation. Overall findings best localize to the cavernous sinuses bilaterally, possibly secondary to an inflammatory process (possibly severe manifestations of Tolosa-Hunt syndrome, an infiltrative process or bilateral cavernous sinus thrombosis). She also states that touching her left eyelid and orbital rim during exam is painful, which would be compatible with the above DDx.  DDx: Eye opening apraxia is felt to be unlikely. Cranial polyneuropathy including CN III IV VI is possible. Miller-Fisher variant of GBS is felt to be higher on the DDx given negative  MRI brain and orbits (see below). Atypical predominantly bulbar presentation of AIDP is also possible; her BLE weakness and hyporeflexia further support this possibility. Critical illness neuropathy/myopathy would not be expected to present disproportionately with eye involvement but this is also possible. Bulbar myopathy or idiopathic cranial polyneuropathy also on the DDx. Botulism and tick paralysis are unlikely as she has not had any dyspnea; however, the pupil in her left eye has no discernible reactivity, which would be expected for botulism (right eye with prior retinal detachment) and she also has swallowing difficulty requiring pureed diet. An atypical presentation of myasthenia gravis is also possible. NMO is on the DDx given BLE weakness and vision loss.  MRI brain  with and without contrast performed yesterday (2/25): Repeat MRI brain with and without contrast (2/25): There is expected evolution of the  infarcts seen on the prior study. No acute infarct. Stable chronic infarcts and chronic microvascular ischemic changes. Scattered chronic microhemorrhages are again noted. Ventricles are stable in size. No intracranial mass  or abnormal enhancement to explain the patient's bilateral ptosis and ophthalmoplegia. Cavernous sinus enhancement is symmetric and unremarkable. Persistent left greater than right mastoid fluid opacification. Sella is unremarkable. MRI orbits with and without contrast performed yesterday (2/25): Bilateral lens replacements. Chronic right retinal detachment. Extraocular muscles are symmetric and unremarkable. No abnormal enhancement of the optic nerve sheath complexes.  6. Impression: Given no cavernous sinus pathology identified on MRI to explain the patient's severe bilateral ptosis and ophthalmoparesis, rare potential etiologies will now need to be considered (in addition to above DDx). The literature documents ptosis as a rare manifestation of Wernicke's encephalopathy from  thiamine deficiency. Her ophthalmoparesis and possible cognitive impairment on exam (she is poorly cooperative, making determination of cognitive impairment difficult) would make thiamine deficiency more likely. Unfortunately, ataxia cannot be tested as she is noncompliant with this and several other components of the exam. Will need thiamine level followed by empiric thiamine as well as an LP. Other work up for her ptosis and ophthalmoparesis is beyond the scope of this inpatient admission and she will need to be evaluated outpatient by a Neurophthalmologist (Dr. Michel Bickers of [facility] is recommended: [phone]).   Recommendations: - Initially planning for LP but patient is on aspirin and plavix and plavix will need to be held for 5 days before we can safely pursue an LP. - spoke with patient and with her sister about high suspicion for [name] in the absence of any brainstem lesions or demyelination on MRI. 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 Patient and sister are agreeable to PLEX. - Spoke with nephrology and they recommend using FFP as a replacement fluid for PLEX instead of albumin. - Follow Thiamine level - High-dose IV thiamine at 500 mg TID x 3 days, followed by 250 mg mg TID x 3 days, then switch to 100 mg po qd thereafter - Myasthenia gravis serum panel (ordered) - Anti-GQ1b, anti-MOG and anti GM1 antibodies. (Ordered as [facility] sendout) - Anti-NMO antibody (ordered) - Will need outpatient EMG/NCS after discharge. Will need outpatient Neurology follow up with [facility] for this.  - Consider 30 day cardiac event monitoring as outpt.   LOS: 25 days   France Ravens, MD PGY1 Resident 10/10/2021  11:27 AM

## 2021-10-10 NOTE — Progress Notes (Signed)
[facility] Progress Note   Subjective:    Seen and examined at bedside. Currently working with Speech Therapy sitting up eating breakfast, husband also at bedside. Appears more interactive today. Denies SOB, CP, and N/V. Plan for HD today in chair.  Objective Vitals:   10/10/21 0054 10/10/21 0500 10/10/21 0555 10/10/21 0800  BP: (!) 150/74  (!) 148/75 (!) 154/75  Pulse: 90  95 98  Resp: 16  16 17   Temp: 98.2 F (36.8 C)  98.6 F (37 C) 98.5 F (36.9 C)  TempSrc: Oral  Oral Oral  SpO2: 100%  100% 99%  Weight:  59.2 kg    Height:       Physical Exam General:chronically ill appearing female in NAD HEENT: Core track tube L nare Heart:RRR, no mrg Lungs:CTAB, nml WOB Abdomen:soft, NTND Extremities:no LE edema, R BKA Dialysis Access: RU AVF +b/t   Filed Weights   10/07/21 1641 10/09/21 0515 10/10/21 0500  Weight: 60.5 kg 61 kg 59.2 kg   No intake or output data in the 24 hours ending 10/10/21 1028  Additional Objective Labs: Basic Metabolic Panel: Recent Labs  Lab 10/05/21 0644 10/05/21 0807 10/07/21 1152 10/09/21 1204  NA  --  133* 131* 133*  K  --  5.2* 5.7* 5.1  CL  --  93* 92* 96*  CO2  --  28 27 27   GLUCOSE  --  210* 188* 223*  BUN  --  61* 84* 84*  CREATININE  --  7.23* 7.81* 6.52*  CALCIUM  --  9.2 10.2 10.5*  PHOS 5.0* 4.7* 5.0*  --    Liver Function Tests: Recent Labs  Lab 10/05/21 0807 10/07/21 1152 10/09/21 1204  AST  --   --  20  ALT  --   --  15  ALKPHOS  --   --  96  BILITOT  --   --  0.3  PROT  --   --  7.3  ALBUMIN 2.1* 2.1* 2.1*   No results for input(s): LIPASE, AMYLASE in the last 168 hours. CBC: Recent Labs  Lab 10/05/21 0807 10/07/21 1152 10/09/21 1204  WBC 10.5 9.7 9.5  HGB 8.6* 8.7* 8.6*  HCT 29.0* 30.0* 29.1*  MCV 97.0 98.7 99.0  PLT 268 298 287   Blood Culture    Component Value Date/Time   SDES BLOOD LEFT HAND 09/22/2021 1554   SPECREQUEST  09/22/2021 1554    BOTTLES DRAWN AEROBIC AND ANAEROBIC  Blood Culture results may not be optimal due to an inadequate volume of blood received in culture bottles   CULT  09/22/2021 1554    NO GROWTH 5 DAYS Performed at [facility], [place] [address] [place], [place] [address]    REPTSTATUS 09/27/2021 FINAL 09/22/2021 1554    Cardiac Enzymes: No results for input(s): CKTOTAL, CKMB, CKMBINDEX, TROPONINI in the last 168 hours. CBG: Recent Labs  Lab 10/09/21 1615 10/09/21 1926 10/09/21 2334 10/10/21 0508 10/10/21 0803  GLUCAP 198* 158* 132* 252* 208*   Iron Studies: No results for input(s): IRON, TIBC, TRANSFERRIN, FERRITIN in the last 72 hours. Lab Results  Component Value Date   INR 1.2 08/10/2021   INR 1.1 06/30/2020   Studies/Results: No results found.  Medications:  [START ON 10/11/2021] thiamine injection     thiamine injection 500 mg (10/09/21 2153)    aspirin EC  81 mg Oral Daily   atorvastatin  40 mg Oral Daily   Chlorhexidine Gluconate Cloth  6 each Topical  Y6503   cinacalcet  90 mg Oral Q supper   clopidogrel  75 mg Oral Daily   darbepoetin (ARANESP) injection - DIALYSIS  200 mcg Intravenous Q Thu-HD   dorzolamide-timolol  1 drop Left Eye BID   feeding supplement (NEPRO CARB STEADY)  780 mL Per Tube Q24H   heparin injection (subcutaneous)  5,000 Units Subcutaneous Q8H   insulin aspart  0-6 Units Subcutaneous TID WC   insulin glargine-yfgn  18 Units Subcutaneous QHS   lidocaine-prilocaine  1 application Topical UD   mouth rinse  15 mL Mouth Rinse BID   multivitamin  1 tablet Oral QHS   nutrition supplement (JUVEN)  1 packet Per Tube BID BM   pantoprazole sodium  40 mg Oral Daily   sevelamer carbonate  1.6 g Oral TID WC   [START ON 10/15/2021] thiamine  100 mg Oral Daily    Dialysis Orders: [facility] 3h 34min   400/ 600   59.5kg   2/2 bath  Prof 2   RU AV fistula  Hep 4000  Mircera 200 mcg IV every 2 weeks - last 1/26 Ancef 2g 3 times weekly HD Covid neg 2/3, hep B SAg neg 2/4  Assessment/Plan: AMS -  acute embolic CVA and/or watershed infarcts.  Repeat MRI with no acute findings.  With PFO on TTE. TEE showed small PFO and no clot. Blood Cx 2/4 negative. S/p course of IV abx.  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 MRI of orbits/sinuses completed this AM, with no abnormalities of orbit/sinuses noted. Neuro following: plan for LP after 5 days off of ASA and Plavix. Discussed with Neuro and Attending Dr. Delena Bali Neuro to initiate possible plasma exchange and replacement fluid (FFP). 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  Scheduled for R AKA 10/11/21 by Dr. Trula Slade. To go to CIR at some point.  ESRD - Usually TTS schedule - HD today. Trying to keep SBP > 100 on HD due to #1. Wants to continue HD at this time.  Will need improvement in behavior during HD and tolerate dialysis in chair prior to d/c. Sepsis/ shock - resolved  Anemia of CKD- last Hgb stable at 8.6, continue Aranesp 246mcg q Thurs. Transfuse pRBC prn.  MBD: CorrCa high, Phos ok. Continue home sensipar/binders, hold VDRA for now.   HTN/volume - midodrine with HD. UF as tolerated w/ albumin support prn. Below prior EDW, will need to be lowered on d/c. Severe protein calorie malnutrition: NG tube in place with Nepro TF's + supplements. DMT2 - per PMD  Dispo - likely will need SNF placement.  Tracey Poet, NP [facility] 10/10/2021,10:28 AM  LOS: 25 days

## 2021-10-10 NOTE — Progress Notes (Signed)
Patient seen and examined in dialysis.  The right below-knee stump is clearly not viable.  I discussed with the patient that we will need to convert this to an above-knee amputation tomorrow.  She will need to be n.p.o. after midnight.  [name]

## 2021-10-10 NOTE — Progress Notes (Addendum)
Nutrition Follow-up  DOCUMENTATION CODES:  Non-severe (moderate) malnutrition in context of acute illness/injury  INTERVENTION:  -d/c cortrak and TF orders -transition renavit and juven to po administration -continue feeding assistance -encourage po intake  NUTRITION DIAGNOSIS:  Moderate Malnutrition related to acute illness (osteomyelitis of R foot) as evidenced by mild fat depletion, moderate muscle depletion. -- ongoing  GOAL:  Patient will meet greater than or equal to 90% of their needs -- progressing  MONITOR:  PO intake, Supplement acceptance, Labs, Weight trends, TF tolerance, Skin, I & O's  REASON FOR ASSESSMENT:  Consult Assessment of nutrition requirement/status, Enteral/tube feeding initiation and management  ASSESSMENT:  41 y.o. 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 In ED, imaging suggested osteomyelitis of the right foot.  02/05 - s/p R BKA 02/11 - NG tube placed, tube feeds initiated 02/13 - CT abdomen/pelvis negative for rectovaginal fistula 02/15 - NG tube removed, diet advanced to dysphagia 2 with thin liquids, due to waning mental status, a Cortrak was placed for nocturnal tube feeds  2/20 - echocardiogram transesophageal procedure  2/21 - Re-initiate nocturnal tube feeds via Cortrak  Pt more interactive today. Working with SLP at time of RD visit. Per SLP, pt consumed ~75% applesauce/egg mixture during assessment. SLP notes good prognosis for diet advancement, though recommending continue dysphagia 2 diet with thin liquids at this time. MD reports pt's husband states pt ate 100% meals yesterday. Pt wishes to have cortrak removed as she feels it is inhibiting her ability to comfortably breathe. Discussed with MD. Although there is concern for ongoing poor po intake, will trial discontinuation of TF/Cortrak in hopes of  pt's diet being able to be further advanced. Will continue to monitor adequacy of po intake without enteral nutrition.   PO Intake: 5-100% x last 7 recorded meals (36% avg meal intake)  Current TF: Nepro @ 25ml/hr x 12 hours (1800-0600) to provide 1404 kcals, 63 grams protein, 5109ml free water  Pt to have HD again today Admit wt: 55 kg Pre-HD/current wt: 59.2 kg  EDW: 59.5 kg  Pt will need new lowered EDW at discharge as pt is currently below EDW  Nephrology notes pt will also need improvement in behavior during HD and improved tolerance of HD in chair prior to d/c  Non-pitting edema to BUE and BLE per RN assessment  Medications: sensipar, aranesp, SSI TID w/ meals, 18 units semglee daily, thiamine, renavit, protonix, renvela TID, Juven BID Labs: Na 133, corrected Ca 12.02, hgb 8.6 CBGs: 132-252 x 24 hours   Diet Order:   Diet Order             Diet NPO time specified Except for: Sips with Meds  Diet effective midnight                  EDUCATION NEEDS: Not appropriate for education at this time  Skin:  Skin Assessment: Skin Integrity Issues: Skin Integrity Issues:: Other (Comment), Stage II Stage II: sacrum Incisions: R BKA Other: MASD bilateral buttocks  Last BM:  2/26  Height:  Ht Readings from Last 1 Encounters:  10/02/21 5\' 1"  (1.549 m)   Weight:  Wt Readings from Last 1 Encounters:  10/10/21 59.2 kg   Ideal Body Weight:  44.9 kg (adjusted by 5.9% for BKA)  BMI:  Body mass index is 24.66 kg/m.  Estimated Nutritional Needs: Kcal:  1800-2000 kcal/d Protein:  90-100 g/d Fluid:  1L+UOP     Theone Stanley., MS, RD, LDN (she/her/hers) RD pager number and weekend/on-call pager number located in [place].

## 2021-10-10 NOTE — H&P (View-Only) (Signed)
Patient seen and examined in dialysis.  The right below-knee stump is clearly not viable.  I discussed with the patient that we will need to convert this to an above-knee amputation tomorrow.  She will need to be n.p.o. after midnight.  [name]

## 2021-10-10 NOTE — Progress Notes (Signed)
Progress Note   Patient: [name] ZOX:[id number] DOB: 03-01-1981 DOA: 09/15/2021     25 DOS: the patient was seen and examined on 10/10/2021   Brief hospital course: 41 year old female with history of ESRD on dialysis Tuesdays Thursdays and Saturday, poorly controlled type 2 diabetes, hypertension she was initially sent in from dialysis session due to concern for altered mental status and lethargy.  She had a transmetatarsal amputation of the right foot 5 weeks prior to admission which grew MSSA and was receiving Ancef at dialysis.  X-ray of the right stump suggested osteomyelitis vascular surgery consulted recommended a below-knee amputation and she underwent BKA on 09/17/2021. From 09/18/2021 patient kept her eyes closed and was somnolent and was not able to or refusing to open her eyes.  On 09/22/2021 rapid response called for lethargy and minimally responsive to sternal rub PCCM and neurology were consulted.  MRI obtained shows scattered punctuate acute infarcts bilaterally and confluent region of the left corona radiata.  CT angiogram of the head and neck showed no large vessel occlusion.  EEG showed diffuse encephalopathy.  She was given pressors overnight and started antibiotics for possible endocarditis and was started on aspirin.  Echocardiogram shows positive for shunting and bicuspid aortic valve. She had a core track tube placed and feedings were started.    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  Patient followed by neurology. Transesophageal echo on 10/02/2021 showed small PFO with no thrombus.  On aspirin and Plavix daily. CIR following.  #2 type 2 diabetes with hypoglycemia increase the dose of Semglee to 18 units..  No further hypoglycemia. CBG (last 3)  Recent Labs    10/10/21 0508 10/10/21 0803 10/10/21 1111  GLUCAP 252* 208* 234*     #3 end-stage renal disease on hemodialysis Tuesday Thursday and Saturday Patient is repeatedly  disruptive in  the dialysis unit.  Constantly yelling and calling out for attention per nurse.    #4 hypotension resolved midodrine Dc'd  #5 moderate malnutrition due to acute illness as evidenced by fat and muscle depletion.  Continue Nepro shakes.  BMI 23.66  Will DC Dobbhoff tube on 10/10/2021.  Hopefully this will improve her p.o. intake.  Patient has been very unhappy with the tube in her nose.  She was complaining that she cannot breathe.  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  Concern for eschar formation in the right stump.  Patient now likely will need above-knee amputation.  #7 b/l ptosis-unclear etiology.  MRI of her orbits and cavernous sinuses does not show any acute pathology.    Neurology planning on plasma exchange for 5 sessions due to concern this could be [name], GBS.  Per Dr. Cheral Marker   Will need LP for cell count with differential, protein, glucose, cytology, IgG index, oligoclonal bands and VDRL. Will need to obtain in the AM on a weekday as the cytology sample must be sent immediately to the lab for processing, which does not occur on the weekends for cytology samples. Also will need to have subcutaneous heparin discontinued overnight prior to LP due to bleeding risk. Will need to be placed on SCDs while off SQ heparin.  - Thiamine level (ordered) - After thiamine level is drawn, start high-dose IV thiamine at 500 mg TID x 3 days, followed by 250 mg mg TID x 3 days, then switch to 100 mg po qd thereafter - Myasthenia gravis serum panel (ordered) - Anti-GQ1b, anti-MOG  and anti GM1 antibodies. (Ordered as [facility] sendout) - Anti-NMO antibody (ordered) - Will need outpatient EMG/NCS after discharge. Will need outpatient Neurology follow up with [facility] for this.  - Consider 30 day cardiac event monitoring as outpt  #8 goals of care patient is full code and is getting dialysis 3 times a week.  Palliative had been  consulted and has signed off.  Subjective:   She was very sleepy and drowsy when I saw her this morning. Discussed with bedside nurse. physical Exam: Vitals:   10/10/21 0054 10/10/21 0500 10/10/21 0555 10/10/21 0800  BP: (!) 150/74  (!) 148/75 (!) 154/75  Pulse: 90  95 98  Resp: 16  16 17   Temp: 98.2 F (36.8 C)  98.6 F (37 C) 98.5 F (36.9 C)  TempSrc: Oral  Oral Oral  SpO2: 100%  100% 99%  Weight:  59.2 kg    Height:       General exam: in nad   HEENT:  no sclera icterus or thrush Respiratory system: Clear to auscultation. Respiratory effort normal. Cardiovascular system: S1 & S2 heard, regular rate and rhythm Gastrointestinal system: Abdomen soft, non-tender, nondistended. Normal bowel sounds   Central nervous system: keeps eyes closes, answers questions and follows commands Extremities:right bka staples in place    Data Reviewed:mag 2.4 phos 4.6   Family Communication: dw sister  Disposition:cir vs snf  Status is: Inpatient   Planned Discharge Destination: Skilled nursing facility  Dvt prophylaxis heparin  Time spent: 42 min minutes  Author: Georgette Shell, MD 10/10/2021 1:01 PM  For on call review [email or web address].

## 2021-10-10 NOTE — Progress Notes (Signed)
Speech Language Pathology Treatment: Dysphagia  Patient Details Name: [name] MRN: [id number] DOB: 08-13-1981 Today's Date: 10/10/2021 Time: 4098-1191 SLP Time Calculation (min) (ACUTE ONLY): 19 min  Assessment / Plan / Recommendation Clinical Impression  Pt on therapist's schedule for today and MD reordered swallow assessment. From notes seems to be an intake consumption issue versus aspiration. Spouse present and attempting to sit her upright. Repositioned and eduction provided throughout session. He initiated allowing therapist to feed herself and encouraged her to do so and took over when she became fatigued. She used adaptive spoon and felt for her containers of food. She ate bite eggs then requested applesauce mixed with eggs which she orally manipulated timely and without residue. Only observed one sip juice. There was no concern with aspiration this session. Spouse assisting pt with breakfast when therapist left and she had consumed 3/4 of small container of eggs/applesauce. Pt should continue Dys 2 texture/thin liquids., do not feel she is quite appropriate to upgrade at this time but progressing nicely. Will continue to follow.    HPI HPI: 41 y.o. female presented to ED 2/3 with AMS, gangrene RLE and osteomyelitis.  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 Further change in mentation 2/10; MRI brain showed scattered punctate acute infarcts throughout both cerebral hemispheres. Pt was on renal diet with 1200 ml fluid restriction prior to CVA, made NPO 2/11 and large bore NG was placed. 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 Palliative care has been consulted in the setting of multiple acute on chronic disease processes to further discuss goals of care. Swallow eval ordered 2/13.      SLP Plan  Continue with current plan of care       Recommendations for follow up therapy are one component of a multi-disciplinary discharge planning process, led by the attending physician.  Recommendations may be updated based on patient status, additional functional criteria and insurance authorization.    Recommendations  Diet recommendations: Dysphagia 2 (fine chop);Thin liquid Liquids provided via: Cup;Straw Medication Administration: Whole meds with puree Supervision: Full supervision/cueing for compensatory strategies;Staff to assist with self feeding Compensations: Minimize environmental distractions;Slow rate;Small sips/bites;Follow solids with liquid Postural Changes and/or Swallow Maneuvers: Seated upright 90 degrees                General recommendations: Rehab consult Oral Care Recommendations: Oral care BID Follow Up Recommendations: Acute inpatient rehab (3hours/day) Assistance recommended at discharge: Frequent or constant Supervision/Assistance SLP Visit Diagnosis: Dysphagia, unspecified (R13.10);Dysphagia, oral phase (R13.11) Plan: Continue with current plan of care           Houston Siren  10/10/2021, 10:04 AM

## 2021-10-10 NOTE — Progress Notes (Signed)
Physical Therapy Treatment Patient Details Name: [name] MRN: [id number] DOB: August 16, 1980 Today's Date: 10/10/2021   History of Present Illness Pt is a 41 y.o. female admitted 09/15/21 post-HD session with concern for AMS when pt stopped speaking to staff. 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 Pt with increased somnolence 2/6; noted to be more lethargic in HD 2/7, 2/9, 2/11. Head CT 2/10 negative for acute injury; chronic small vessel disease and infarcts. MRI 2/10 with scattered punctate acute infarcts bilateral cerebral hemispheres (may be embolic), potential subacute infarct L corona radiata. Pt with persistent bilateral ptosis (pt reports ongoing ~1 month); awaiting MRI of orbits on 2/25. PMH includes ESRD (HD TTS), DM2, HTN, neuropathy, R eye blindness (2008).    PT Comments    Pt received in supine in conjunction with dialysis transport stating pt needing to be in chair for transport. Continued with step-by-step instructions and tactile input for pt to feel her environment throughout mobilization with good results as pt able to roll to L sidelying with very light min assist and up to mod assist to elevate trunk. Pt continues to demonstrate improved sitting balance with ability to sit with single UE support EOB without LOB. Pt needing total assist +2 to squat pivot transfer to dialysis chair with heavy cueing for anterior weight shift and to try to push through LLE. Pt continues to benefit from skilled PT services to progress toward functional mobility goals.    Recommendations for follow up therapy are one component of a multi-disciplinary discharge planning process, led by the attending physician.  Recommendations may be updated based on patient status, additional functional criteria and insurance authorization.  Follow Up Recommendations  Acute inpatient rehab (3hours/day)     Assistance Recommended at Discharge Frequent or constant  Supervision/Assistance  Patient can return home with the following Two people to help with walking and/or transfers;Assistance with cooking/housework;Assistance with feeding;Direct supervision/assist for medications management;Direct supervision/assist for financial management;Assist for transportation;Help with stairs or ramp for entrance;A lot of help with bathing/dressing/bathroom   Equipment Recommendations  BSC/3in1;Wheelchair (measurements PT);Wheelchair cushion (measurements PT);Hospital bed;Other (comment)    Recommendations for Other Services Rehab consult     Precautions / Restrictions Precautions Precautions: Fall Precaution Comments: Rt BKA, new bil ptosis with blindness, bowel incontinence Required Braces or Orthoses: Other Brace Other Brace: RLE limb guard Restrictions Weight Bearing Restrictions: Yes RUE Weight Bearing: Weight bearing as tolerated RLE Weight Bearing: Non weight bearing     Mobility  Bed Mobility Overal bed mobility: Needs Assistance Bed Mobility: Rolling Rolling: Min assist Sidelying to sit: Mod assist       General bed mobility comments: min assist to roll to L with cues and step by step verbal sequencing and guiding her hands to rails. Mod assist to come to sitting secodary to some fear, but with reassurance and having pt feel PTA in front of her she is able    Transfers Overall transfer level: Needs assistance Equipment used: None Transfers: Bed to chair/wheelchair/BSC       Squat pivot transfers: Total assist, +2 physical assistance, From elevated surface     General transfer comment: total assist +2 to squat pivot to dialysis chair, pt with posterior lean, heavy cueing for anterior weight shift and to try to push through LLE    Ambulation/Gait                   Stairs  Wheelchair Mobility    Modified Rankin (Stroke Patients Only)       Balance   Sitting-balance support: Feet supported, Bilateral  upper extremity supported Sitting balance-Leahy Scale: Poor Sitting balance - Comments: pt requires UE support and min A progressing to min guard assist, pt able to sit and maintain balace EOB x15 mins                                    Cognition Arousal/Alertness: Awake/alert Behavior During Therapy: Flat affect Overall Cognitive Status: Impaired/Different from baseline Area of Impairment: Following commands, Problem solving, Attention                 Orientation Level: Person, Place, Time, Situation Current Attention Level: Sustained Memory: Decreased recall of precautions, Decreased short-term memory Following Commands: Follows one step commands with increased time Safety/Judgement: Decreased awareness of safety, Decreased awareness of deficits Awareness: Emergent Problem Solving: Slow processing, Difficulty sequencing, Requires verbal cues, Requires tactile cues          Exercises      General Comments        Pertinent Vitals/Pain Pain Assessment Pain Assessment: No/denies pain    Home Living                          Prior Function            PT Goals (current goals can now be found in the care plan section) Acute Rehab PT Goals PT Goal Formulation: With patient Time For Goal Achievement: 10/14/21    Frequency    Min 4X/week      PT Plan Current plan remains appropriate    Co-evaluation              AM-PAC PT "6 Clicks" Mobility   Outcome Measure  Help needed turning from your back to your side while in a flat bed without using bedrails?: A Little Help needed moving from lying on your back to sitting on the side of a flat bed without using bedrails?: A Little Help needed moving to and from a bed to a chair (including a wheelchair)?: Total Help needed standing up from a chair using your arms (e.g., wheelchair or bedside chair)?: Total Help needed to walk in hospital room?: Total Help needed climbing 3-5 steps  with a railing? : Total 6 Click Score: 10    End of Session Equipment Utilized During Treatment: Gait belt Activity Tolerance: Patient tolerated treatment well Patient left: in chair;with nursing/sitter in room (pt taken off unit by dislysis) Nurse Communication: Mobility status PT Visit Diagnosis: Other abnormalities of gait and mobility (R26.89);Muscle weakness (generalized) (M62.81);Difficulty in walking, not elsewhere classified (R26.2);Other symptoms and signs involving the nervous system (R29.898) Pain - Right/Left: Right Pain - part of body: Leg     Time: 1353-1405 PT Time Calculation (min) (ACUTE ONLY): 12 min  Charges:  $Therapeutic Activity: 8-22 mins                    Gary Gabrielsen R. PTA Acute Rehabilitation Services Office: [place] 10/10/2021, 2:22 PM

## 2021-10-11 ENCOUNTER — Encounter (HOSPITAL_COMMUNITY)
Admission: EM | Disposition: A | Discharge status: Other Acute Inpatient Hospital | Payer: Self-pay | Source: Home / Self Care | Attending: Internal Medicine

## 2021-10-11 ENCOUNTER — Encounter (HOSPITAL_COMMUNITY): Payer: Self-pay | Admitting: Internal Medicine

## 2021-10-11 ENCOUNTER — Other Ambulatory Visit: Payer: Self-pay

## 2021-10-11 ENCOUNTER — Inpatient Hospital Stay (HOSPITAL_COMMUNITY): Payer: Medicare Other | Admitting: Anesthesiology

## 2021-10-11 ENCOUNTER — Inpatient Hospital Stay (HOSPITAL_COMMUNITY): Payer: Medicare Other

## 2021-10-11 DIAGNOSIS — I70221 Atherosclerosis of native arteries of extremities with rest pain, right leg: Secondary | ICD-10-CM

## 2021-10-11 DIAGNOSIS — E1122 Type 2 diabetes mellitus with diabetic chronic kidney disease: Secondary | ICD-10-CM

## 2021-10-11 DIAGNOSIS — I998 Other disorder of circulatory system: Secondary | ICD-10-CM

## 2021-10-11 DIAGNOSIS — I120 Hypertensive chronic kidney disease with stage 5 chronic kidney disease or end stage renal disease: Secondary | ICD-10-CM

## 2021-10-11 DIAGNOSIS — E1151 Type 2 diabetes mellitus with diabetic peripheral angiopathy without gangrene: Secondary | ICD-10-CM

## 2021-10-11 DIAGNOSIS — M869 Osteomyelitis, unspecified: Secondary | ICD-10-CM | POA: Diagnosis not present

## 2021-10-11 DIAGNOSIS — T8781 Dehiscence of amputation stump: Secondary | ICD-10-CM

## 2021-10-11 DIAGNOSIS — N186 End stage renal disease: Secondary | ICD-10-CM

## 2021-10-11 HISTORY — PX: IR US GUIDE VASC ACCESS RIGHT: IMG2390

## 2021-10-11 HISTORY — PX: IR FLUORO GUIDE CV LINE RIGHT: IMG2283

## 2021-10-11 HISTORY — PX: AMPUTATION: SHX166

## 2021-10-11 LAB — GLUCOSE, CAPILLARY
Glucose-Capillary: 49 mg/dL — ABNORMAL LOW (ref 70–99)
Glucose-Capillary: 109 mg/dL — ABNORMAL HIGH (ref 70–99)
Glucose-Capillary: 75 mg/dL (ref 70–99)
Glucose-Capillary: 71 mg/dL (ref 70–99)
Glucose-Capillary: 71 mg/dL (ref 70–99)
Glucose-Capillary: 74 mg/dL (ref 70–99)
Glucose-Capillary: 119 mg/dL — ABNORMAL HIGH (ref 70–99)
Glucose-Capillary: 106 mg/dL — ABNORMAL HIGH (ref 70–99)
Glucose-Capillary: 88 mg/dL (ref 70–99)
Glucose-Capillary: 90 mg/dL (ref 70–99)
Glucose-Capillary: 174 mg/dL — ABNORMAL HIGH (ref 70–99)

## 2021-10-11 LAB — POCT I-STAT, CHEM 8
BUN: 47 mg/dL — ABNORMAL HIGH (ref 6–20)
Calcium, Ion: 1.36 mmol/L (ref 1.15–1.40)
Chloride: 98 mmol/L (ref 98–111)
Creatinine, Ser: 5.2 mg/dL — ABNORMAL HIGH (ref 0.44–1.00)
Glucose, Bld: 69 mg/dL — ABNORMAL LOW (ref 70–99)
HCT: 36 % (ref 36.0–46.0)
Hemoglobin: 12.2 g/dL (ref 12.0–15.0)
Potassium: 4.8 mmol/L (ref 3.5–5.1)
Sodium: 132 mmol/L — ABNORMAL LOW (ref 135–145)
TCO2: 27 mmol/L (ref 22–32)

## 2021-10-11 LAB — NEUROMYELITIS OPTICA AUTOAB, IGG: NMO-IgG: <1.5 U/mL (ref 0.0–3.0)

## 2021-10-11 LAB — HCG, SERUM, QUALITATIVE: Preg, Serum: NEGATIVE

## 2021-10-11 SURGERY — AMPUTATION, ABOVE KNEE
Anesthesia: General | Site: Knee | Laterality: Right

## 2021-10-11 MED ORDER — FENTANYL CITRATE (PF) 100 MCG/2ML IJ SOLN: 25.0000 ug | INTRAMUSCULAR | Status: DC | PRN

## 2021-10-11 MED ORDER — PROPOFOL 10 MG/ML IV BOLUS
INTRAVENOUS | Status: AC
  Filled 2021-10-11: qty 20

## 2021-10-11 MED ORDER — DIPHENHYDRAMINE HCL 25 MG PO CAPS
25.0000 mg | ORAL_CAPSULE | Freq: Four times a day (QID) | ORAL | Status: DC | PRN
  Administered 2021-10-11: 25 mg via ORAL
  Filled 2021-10-11: qty 1

## 2021-10-11 MED ORDER — ARTIFICIAL TEARS OPHTHALMIC OINT
TOPICAL_OINTMENT | OPHTHALMIC | Status: DC | PRN
  Administered 2021-10-11: 1 via OPHTHALMIC

## 2021-10-11 MED ORDER — ACETAMINOPHEN 325 MG PO TABS
650.0000 mg | ORAL_TABLET | ORAL | Status: DC | PRN
  Administered 2021-10-11: 650 mg via ORAL
  Filled 2021-10-11: qty 2

## 2021-10-11 MED ORDER — DEXAMETHASONE SODIUM PHOSPHATE 10 MG/ML IJ SOLN
INTRAMUSCULAR | Status: DC | PRN
Start: 2021-10-11 — End: 2021-10-11
  Administered 2021-10-11: 5 mg via INTRAVENOUS

## 2021-10-11 MED ORDER — HEPARIN SODIUM (PORCINE) 1000 UNIT/ML IJ SOLN
INTRAMUSCULAR | Status: AC
Start: 2021-10-11 — End: 2021-10-11
  Administered 2021-10-11: 2.8 mL
  Filled 2021-10-11: qty 10

## 2021-10-11 MED ORDER — ONDANSETRON HCL 4 MG/2ML IJ SOLN: 4.0000 mg | Freq: Once | INTRAMUSCULAR | Status: DC | PRN

## 2021-10-11 MED ORDER — SUGAMMADEX SODIUM 200 MG/2ML IV SOLN
INTRAVENOUS | Status: DC | PRN
Start: 2021-10-11 — End: 2021-10-11
  Administered 2021-10-11: 200 mg via INTRAVENOUS

## 2021-10-11 MED ORDER — SUCCINYLCHOLINE CHLORIDE 200 MG/10ML IV SOSY
PREFILLED_SYRINGE | INTRAVENOUS | Status: DC | PRN
  Administered 2021-10-11: 160 mg via INTRAVENOUS

## 2021-10-11 MED ORDER — FENTANYL CITRATE PF 50 MCG/ML IJ SOSY
12.5000 ug | PREFILLED_SYRINGE | Freq: Once | INTRAMUSCULAR | Status: AC
Start: 2021-10-11 — End: 2021-10-11
  Administered 2021-10-11: 12.5 ug via INTRAVENOUS
  Filled 2021-10-11: qty 1

## 2021-10-11 MED ORDER — BUPIVACAINE HCL (PF) 0.5 % IJ SOLN
INTRAMUSCULAR | Status: AC
  Filled 2021-10-11: qty 30

## 2021-10-11 MED ORDER — ORAL CARE MOUTH RINSE: 15.0000 mL | Freq: Once | OROMUCOSAL | Status: AC

## 2021-10-11 MED ORDER — ACETAMINOPHEN 160 MG/5ML PO SOLN: 1000.0000 mg | Freq: Once | ORAL | Status: DC | PRN

## 2021-10-11 MED ORDER — PHENYLEPHRINE 40 MCG/ML (10ML) SYRINGE FOR IV PUSH (FOR BLOOD PRESSURE SUPPORT)
PREFILLED_SYRINGE | INTRAVENOUS | Status: DC | PRN
  Administered 2021-10-11: 80 ug via INTRAVENOUS

## 2021-10-11 MED ORDER — SODIUM CHLORIDE 0.9 % IV SOLN: 100.0000 mL | INTRAVENOUS | Status: DC | PRN

## 2021-10-11 MED ORDER — HEPARIN SODIUM (PORCINE) 1000 UNIT/ML IJ SOLN: 1000.0000 [IU] | Freq: Once | INTRAMUSCULAR | Status: DC

## 2021-10-11 MED ORDER — CALCIUM CARBONATE ANTACID 500 MG PO CHEW
2.0000 | CHEWABLE_TABLET | ORAL | Status: AC
  Administered 2021-10-11: 400 mg via ORAL
  Filled 2021-10-11: qty 2

## 2021-10-11 MED ORDER — FENTANYL CITRATE (PF) 250 MCG/5ML IJ SOLN
INTRAMUSCULAR | Status: AC
  Filled 2021-10-11: qty 5

## 2021-10-11 MED ORDER — LIDOCAINE-EPINEPHRINE 1 %-1:100000 IJ SOLN
INTRAMUSCULAR | Status: AC
  Administered 2021-10-11: 5 mL
  Filled 2021-10-11: qty 1

## 2021-10-11 MED ORDER — BUPIVACAINE HCL 0.5 % IJ SOLN
INTRAMUSCULAR | Status: DC | PRN
  Administered 2021-10-11: 30 mL

## 2021-10-11 MED ORDER — BUPIVACAINE LIPOSOME 1.3 % IJ SUSP
INTRAMUSCULAR | Status: AC
  Filled 2021-10-11: qty 20

## 2021-10-11 MED ORDER — OXYCODONE-ACETAMINOPHEN 5-325 MG PO TABS
1.0000 | ORAL_TABLET | Freq: Four times a day (QID) | ORAL | Status: AC | PRN
  Administered 2021-10-11 – 2021-10-12 (×2): 1 via ORAL
  Filled 2021-10-11 (×2): qty 1

## 2021-10-11 MED ORDER — CALCIUM GLUCONATE-NACL 2-0.675 GM/100ML-% IV SOLN
2.0000 g | Freq: Once | INTRAVENOUS | Status: AC
  Administered 2021-10-11: 2000 mg via INTRAVENOUS
  Filled 2021-10-11: qty 100

## 2021-10-11 MED ORDER — DEXTROSE 50 % IV SOLN
INTRAVENOUS | Status: AC
  Filled 2021-10-11: qty 50

## 2021-10-11 MED ORDER — 0.9 % SODIUM CHLORIDE (POUR BTL) OPTIME
TOPICAL | Status: DC | PRN
  Administered 2021-10-11: 1000 mL

## 2021-10-11 MED ORDER — FENTANYL CITRATE (PF) 250 MCG/5ML IJ SOLN
INTRAMUSCULAR | Status: DC | PRN
  Administered 2021-10-11: 100 ug via INTRAVENOUS
  Administered 2021-10-11: 50 ug via INTRAVENOUS

## 2021-10-11 MED ORDER — SODIUM CHLORIDE (PF) 0.9 % IJ SOLN
INTRAMUSCULAR | Status: DC | PRN
  Administered 2021-10-11: 50 mL via INTRAVENOUS

## 2021-10-11 MED ORDER — ARTIFICIAL TEARS OPHTHALMIC OINT
TOPICAL_OINTMENT | OPHTHALMIC | Status: AC
  Filled 2021-10-11: qty 3.5

## 2021-10-11 MED ORDER — VANCOMYCIN HCL 1000 MG IV SOLR
INTRAVENOUS | Status: DC | PRN
  Administered 2021-10-11: 1000 mg via INTRAVENOUS

## 2021-10-11 MED ORDER — ACETAMINOPHEN 500 MG PO TABS: 1000.0000 mg | ORAL_TABLET | Freq: Once | ORAL | Status: DC | PRN

## 2021-10-11 MED ORDER — INSULIN GLARGINE-YFGN 100 UNIT/ML ~~LOC~~ SOLN
14.0000 [IU] | Freq: Every day | SUBCUTANEOUS | Status: DC
  Administered 2021-10-11 – 2021-10-12 (×2): 14 [IU] via SUBCUTANEOUS
  Filled 2021-10-11 (×3): qty 0.14

## 2021-10-11 MED ORDER — VANCOMYCIN HCL 1000 MG IV SOLR
INTRAVENOUS | Status: AC
  Filled 2021-10-11: qty 20

## 2021-10-11 MED ORDER — LIDOCAINE 2% (20 MG/ML) 5 ML SYRINGE
INTRAMUSCULAR | Status: AC
  Filled 2021-10-11: qty 5

## 2021-10-11 MED ORDER — CHLORHEXIDINE GLUCONATE 0.12 % MT SOLN
15.0000 mL | Freq: Once | OROMUCOSAL | Status: AC
  Administered 2021-10-11: 15 mL via OROMUCOSAL
  Filled 2021-10-11: qty 15

## 2021-10-11 MED ORDER — ROCURONIUM BROMIDE 10 MG/ML (PF) SYRINGE
PREFILLED_SYRINGE | INTRAVENOUS | Status: DC | PRN
Start: 2021-10-11 — End: 2021-10-11
  Administered 2021-10-11: 30 mg via INTRAVENOUS

## 2021-10-11 MED ORDER — BUPIVACAINE LIPOSOME 1.3 % IJ SUSP
INTRAMUSCULAR | Status: DC | PRN
  Administered 2021-10-11: 20 mL

## 2021-10-11 MED ORDER — ONDANSETRON HCL 4 MG/2ML IJ SOLN
INTRAMUSCULAR | Status: DC | PRN
  Administered 2021-10-11: 4 mg via INTRAVENOUS

## 2021-10-11 MED ORDER — DEXAMETHASONE SODIUM PHOSPHATE 10 MG/ML IJ SOLN
INTRAMUSCULAR | Status: AC
  Filled 2021-10-11: qty 1

## 2021-10-11 MED ORDER — SODIUM CHLORIDE 0.9 % IV SOLN: INTRAVENOUS | Status: DC

## 2021-10-11 MED ORDER — PROPOFOL 10 MG/ML IV BOLUS
INTRAVENOUS | Status: DC | PRN
  Administered 2021-10-11: 90 mg via INTRAVENOUS

## 2021-10-11 MED ORDER — ACD FORMULA A 0.73-2.45-2.2 GM/100ML VI SOLN
1000.0000 mL | Status: DC
  Administered 2021-10-11: 1000 mL
  Filled 2021-10-11: qty 1000

## 2021-10-11 MED ORDER — LIDOCAINE HCL 1 % IJ SOLN
INTRAMUSCULAR | Status: AC
  Filled 2021-10-11: qty 20

## 2021-10-11 MED ORDER — DEXTROSE IN LACTATED RINGERS 5 % IV SOLN: INTRAVENOUS | Status: DC

## 2021-10-11 MED ORDER — ACETAMINOPHEN 10 MG/ML IV SOLN: 1000.0000 mg | Freq: Once | INTRAVENOUS | Status: DC | PRN

## 2021-10-11 MED ORDER — DEXTROSE 50 % IV SOLN
INTRAVENOUS | Status: DC | PRN
  Administered 2021-10-11: 25 mL via INTRAVENOUS
  Administered 2021-10-11: 12.5 mL via INTRAVENOUS

## 2021-10-11 MED ORDER — INSULIN ASPART 100 UNIT/ML IJ SOLN: 0.0000 [IU] | INTRAMUSCULAR | Status: DC | PRN

## 2021-10-11 SURGICAL SUPPLY — 58 items
BAG COUNTER SPONGE SURGICOUNT (BAG) ×3 IMPLANT
BAG SPNG CNTER NS LX DISP (BAG) ×2
BLADE SAW GIGLI 510 (BLADE) ×2 IMPLANT
BNDG COHESIVE 6X5 TAN STRL LF (GAUZE/BANDAGES/DRESSINGS) ×2 IMPLANT
BNDG ELASTIC 4X5.8 VLCR STR LF (GAUZE/BANDAGES/DRESSINGS) ×2 IMPLANT
BNDG ELASTIC 6X5.8 VLCR STR LF (GAUZE/BANDAGES/DRESSINGS) ×2 IMPLANT
BNDG GAUZE ELAST 4 BULKY (GAUZE/BANDAGES/DRESSINGS) ×3 IMPLANT
CANISTER SUCT 3000ML PPV (MISCELLANEOUS) ×1 IMPLANT
CLIP VESOCCLUDE MED 6/CT (CLIP) ×2 IMPLANT
CNTNR URN SCR LID CUP LEK RST (MISCELLANEOUS) ×1 IMPLANT
CONT SPEC 4OZ STRL OR WHT (MISCELLANEOUS) ×2
COVER SURGICAL LIGHT HANDLE (MISCELLANEOUS) ×1 IMPLANT
DRAIN CHANNEL 19F RND (DRAIN) IMPLANT
DRAPE DERMATAC (DRAPES) IMPLANT
DRAPE HALF SHEET 40X57 (DRAPES) ×2 IMPLANT
DRAPE INCISE IOBAN 66X45 STRL (DRAPES) IMPLANT
DRAPE ORTHO SPLIT 77X108 STRL (DRAPES) ×2
DRAPE SURG ORHT 6 SPLT 77X108 (DRAPES) ×2 IMPLANT
DRESSING PREVENA PLUS CUSTOM (GAUZE/BANDAGES/DRESSINGS) IMPLANT
DRSG ADAPTIC 3X8 NADH LF (GAUZE/BANDAGES/DRESSINGS) ×2 IMPLANT
DRSG PREVENA PLUS CUSTOM (GAUZE/BANDAGES/DRESSINGS)
ELECT CAUTERY BLADE 6.4 (BLADE) ×2 IMPLANT
ELECT REM PT RETURN 9FT ADLT (ELECTROSURGICAL) ×2
ELECTRODE REM PT RTRN 9FT ADLT (ELECTROSURGICAL) ×1 IMPLANT
EVACUATOR SILICONE 100CC (DRAIN) IMPLANT
GAUZE 4X4 16PLY ~~LOC~~+RFID DBL (SPONGE) ×1 IMPLANT
GAUZE SPONGE 4X4 12PLY STRL (GAUZE/BANDAGES/DRESSINGS) ×3 IMPLANT
GLOVE SRG 8 PF TXTR STRL LF DI (GLOVE) ×1 IMPLANT
GLOVE SURG POLYISO LF SZ7.5 (GLOVE) ×2 IMPLANT
GLOVE SURG UNDER POLY LF SZ7.5 (GLOVE) ×2 IMPLANT
GLOVE SURG UNDER POLY LF SZ8 (GLOVE) ×6
GOWN STRL REUS W/ TWL LRG LVL3 (GOWN DISPOSABLE) ×2 IMPLANT
GOWN STRL REUS W/ TWL XL LVL3 (GOWN DISPOSABLE) ×1 IMPLANT
GOWN STRL REUS W/TWL LRG LVL3 (GOWN DISPOSABLE) ×4
GOWN STRL REUS W/TWL XL LVL3 (GOWN DISPOSABLE) ×2
KIT BASIN OR (CUSTOM PROCEDURE TRAY) ×2 IMPLANT
KIT TURNOVER KIT B (KITS) ×2 IMPLANT
NDL HYPO 25GX1X1/2 BEV (NEEDLE) ×1 IMPLANT
NEEDLE HYPO 25GX1X1/2 BEV (NEEDLE) ×2 IMPLANT
NS IRRIG 1000ML POUR BTL (IV SOLUTION) ×2 IMPLANT
PACK GENERAL/GYN (CUSTOM PROCEDURE TRAY) ×2 IMPLANT
PAD ARMBOARD 7.5X6 YLW CONV (MISCELLANEOUS) ×4 IMPLANT
PREVENA RESTOR ARTHOFORM 46X30 (CANNISTER) IMPLANT
SPONGE T-LAP 18X18 ~~LOC~~+RFID (SPONGE) ×2 IMPLANT
STAPLER VISISTAT 35W (STAPLE) ×2 IMPLANT
STOCKINETTE IMPERVIOUS LG (DRAPES) ×2 IMPLANT
SUT ETHILON 3 0 PS 1 (SUTURE) IMPLANT
SUT SILK 0 TIES 10X30 (SUTURE) ×2 IMPLANT
SUT SILK 2 0 (SUTURE)
SUT SILK 2 0SH CR/8 30 (SUTURE) ×1 IMPLANT
SUT SILK 2-0 18XBRD TIE 12 (SUTURE) IMPLANT
SUT SILK 3 0 (SUTURE)
SUT SILK 3-0 18XBRD TIE 12 (SUTURE) IMPLANT
SUT VIC AB 2-0 CT1 18 (SUTURE) ×4 IMPLANT
SYR CONTROL 10ML LL (SYRINGE) ×2 IMPLANT
TOWEL GREEN STERILE (TOWEL DISPOSABLE) ×2 IMPLANT
UNDERPAD 30X36 HEAVY ABSORB (UNDERPADS AND DIAPERS) ×2 IMPLANT
WATER STERILE IRR 1000ML POUR (IV SOLUTION) ×2 IMPLANT

## 2021-10-11 NOTE — Procedures (Signed)
Interventional Radiology Procedure Note ? ?Procedure: Temporary hemodialysis catheter placement ? ?Findings: Please refer to procedural dictation for full description.Occluded right IJ.  Placed 20 cm Trialysis catheter in right EJ.  Tip in right atrium. ? ?Complications: None immediate ? ?Estimated Blood Loss: <5 mL ? ?Recommendations: ?Catheter ready for immediate use. ? ? ?Ruthann Cancer, MD ?Pager: [phone] ? ? ? ?

## 2021-10-11 NOTE — Progress Notes (Addendum)
[facility] Progress Note   Subjective:    Seen and examined patient at bedside. S/p R IJ temp HD catheter today by IR Dr. Karlene Einstein Neuro, planning for PLEX. Patient's husband also at bedside. She denies SOB, CP, and N/V. Scheduled for R AKA by Dr. Trula Slade today. Tolerated yesterday's HD with net UF 1.2L. Also tolerated being in chair during HD. Plan for HD 10/12/21.  Objective Vitals:   10/11/21 0742 10/11/21 0905 10/11/21 0929 10/11/21 0956  BP: (!) 152/72 (!) 158/79 (!) 154/86   Pulse: 88 90    Resp: '17 17 17   ' Temp: 98.4 F (36.9 C) 98 F (36.7 C) 97.8 F (36.6 C)   TempSrc: Oral Oral Oral   SpO2: 99% 100%  98%  Weight:      Height:       Physical Exam General:Chronically ill appearing female in NAD HEENT: Core track tube L nare Heart:RRR, no mrg Lungs:CTAB, nml WOB Abdomen:soft, NTND Extremities:no LE edema, R BKA Dialysis Access: RU AVF +b/t; R IJ temp HD cath (placed today by IR)  Filed Weights   10/07/21 1641 10/09/21 0515 10/10/21 0500  Weight: 60.5 kg 61 kg 59.2 kg    Intake/Output Summary (Last 24 hours) at 10/11/2021 1100 Last data filed at 10/10/2021 2100 Gross per 24 hour  Intake 60 ml  Output 1280 ml  Net -1220 ml    Additional Objective Labs: Basic Metabolic Panel: Recent Labs  Lab 10/05/21 0807 10/07/21 1152 10/09/21 1204 10/10/21 1418 10/11/21 0950  NA 133* 131* 133* 128* 132*  K 5.2* 5.7* 5.1 5.5* 4.8  CL 93* 92* 96* 92* 98  CO2 '28 27 27 24  ' --   GLUCOSE 210* 188* 223* 184* 69*  BUN 61* 84* 84* 110* 47*  CREATININE 7.23* 7.81* 6.52* 7.61* 5.20*  CALCIUM 9.2 10.2 10.5* 10.7*  --   PHOS 4.7* 5.0*  --  3.9  --    Liver Function Tests: Recent Labs  Lab 10/07/21 1152 10/09/21 1204 10/10/21 1418  AST  --  20  --   ALT  --  15  --   ALKPHOS  --  96  --   BILITOT  --  0.3  --   PROT  --  7.3  --   ALBUMIN 2.1* 2.1* 2.1*   No results for input(s): LIPASE, AMYLASE in the last 168 hours. CBC: Recent Labs  Lab  10/05/21 0807 10/07/21 1152 10/09/21 1204 10/10/21 1418 10/11/21 0950  WBC 10.5 9.7 9.5 8.8  --   HGB 8.6* 8.7* 8.6* 8.6* 12.2  HCT 29.0* 30.0* 29.1* 29.4* 36.0  MCV 97.0 98.7 99.0 99.3  --   PLT 268 298 287 310  --    Blood Culture    Component Value Date/Time   SDES BLOOD LEFT HAND 09/22/2021 1554   SPECREQUEST  09/22/2021 1554    BOTTLES DRAWN AEROBIC AND ANAEROBIC Blood Culture results may not be optimal due to an inadequate volume of blood received in culture bottles   CULT  09/22/2021 1554    NO GROWTH 5 DAYS Performed at [facility], [place] [address], [place], [place] [address]    REPTSTATUS 09/27/2021 FINAL 09/22/2021 1554    Cardiac Enzymes: No results for input(s): CKTOTAL, CKMB, CKMBINDEX, TROPONINI in the last 168 hours. CBG: Recent Labs  Lab 10/11/21 0612 10/11/21 0755 10/11/21 0903 10/11/21 0928 10/11/21 1025  GLUCAP 75 71 74 71 119*   Iron Studies: No results for input(s): IRON, TIBC,  TRANSFERRIN, FERRITIN in the last 72 hours. Lab Results  Component Value Date   INR 1.2 08/10/2021   INR 1.1 06/30/2020   Studies/Results: IR Fluoro Guide CV Line Right  Result Date: 10/11/2021 INDICATION: 42 year old female with history of Guillain-Barre syndrome requiring central venous access for PLEX. EXAM: NON-TUNNELED CENTRAL VENOUS HEMODIALYSIS CATHETER PLACEMENT WITH ULTRASOUND AND FLUOROSCOPIC GUIDANCE COMPARISON:  None. MEDICATIONS: None FLUOROSCOPY TIME:  0 minutes, 6 seconds (2 mGy) COMPLICATIONS: None immediate. PROCEDURE: Informed written consent was obtained from the patient after a discussion of the risks, benefits, and alternatives to treatment. Questions regarding the procedure were encouraged and answered. The right neck and chest were prepped with chlorhexidine in a sterile fashion, and a sterile drape was applied covering the operative field. Maximum barrier sterile technique with sterile gowns and gloves were used for the procedure. A timeout was  performed prior to the initiation of the procedure. Preprocedure ultrasound evaluation demonstrated occlusion of the right internal jugular vein. The right external jugular vein was patent. After the overlying soft tissues were anesthetized, a small venotomy incision was created and a micropuncture kit was utilized to access the right external jugular vein. Real-time ultrasound guidance was utilized for vascular access including the acquisition of a permanent ultrasound image documenting patency of the accessed vessel. The microwire was utilized to measure appropriate catheter length. A Rosen wire was advanced to the level of the IVC. Under fluoroscopic guidance, the venotomy was serially dilated, ultimately allowing placement of a 20 cm temporary Trialysis catheter with tip ultimately terminating within the superior aspect of the right atrium. Final catheter positioning was confirmed and documented with a spot radiographic image. The catheter aspirates and flushes normally. The catheter was flushed with appropriate volume heparin dwells. The catheter exit site was secured with a 0 silk retention suture. A dressing was placed. The patient tolerated the procedure well without immediate post procedural complication. IMPRESSION: 1. Successful placement of a right external jugular vein approach 20 cm temporary dialysis catheter with tip terminating with in the superior aspect of the right atrium. The catheter is ready for immediate use. 2. 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 Ruthann Cancer, MD [facility] Radiology Electronically Signed   By: Ruthann Cancer M.D.   On: 10/11/2021 08:52   IR US Guide Vasc Access Right  Result Date: 10/11/2021 INDICATION: 41 year old female with history of Guillain-Barre syndrome requiring central venous access for PLEX. EXAM: NON-TUNNELED CENTRAL VENOUS HEMODIALYSIS CATHETER PLACEMENT WITH ULTRASOUND AND FLUOROSCOPIC  GUIDANCE COMPARISON:  None. MEDICATIONS: None FLUOROSCOPY TIME:  0 minutes, 6 seconds (2 mGy) COMPLICATIONS: None immediate. PROCEDURE: Informed written consent was obtained from the patient after a discussion of the risks, benefits, and alternatives to treatment. Questions regarding the procedure were encouraged and answered. The right neck and chest were prepped with chlorhexidine in a sterile fashion, and a sterile drape was applied covering the operative field. Maximum barrier sterile technique with sterile gowns and gloves were used for the procedure. A timeout was performed prior to the initiation of the procedure. Preprocedure ultrasound evaluation demonstrated occlusion of the right internal jugular vein. The right external jugular vein was patent. After the overlying soft tissues were anesthetized, a small venotomy incision was created and a micropuncture kit was utilized to access the right external jugular vein. Real-time ultrasound guidance was utilized for vascular access including the acquisition of a permanent ultrasound image documenting patency of the accessed vessel. The microwire was utilized to measure appropriate catheter  length. A Rosen wire was advanced to the level of the IVC. Under fluoroscopic guidance, the venotomy was serially dilated, ultimately allowing placement of a 20 cm temporary Trialysis catheter with tip ultimately terminating within the superior aspect of the right atrium. Final catheter positioning was confirmed and documented with a spot radiographic image. The catheter aspirates and flushes normally. The catheter was flushed with appropriate volume heparin dwells. The catheter exit site was secured with a 0 silk retention suture. A dressing was placed. The patient tolerated the procedure well without immediate post procedural complication. IMPRESSION: 1. Successful placement of a right external jugular vein approach 20 cm temporary dialysis catheter with tip terminating with  in the superior aspect of the right atrium. The catheter is ready for immediate use. 2. 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 Ruthann Cancer, MD [facility] Radiology Electronically Signed   By: Ruthann Cancer M.D.   On: 10/11/2021 08:52    Medications:  sodium chloride 10 mL/hr at 10/11/21 1030   dextrose 5% lactated ringers 30 mL/hr at 10/11/21 0353   [MAR Hold] thiamine injection     [MAR Hold] thiamine injection 500 mg (10/10/21 2153)    [MAR Hold] aspirin EC  81 mg Oral Daily   [MAR Hold] atorvastatin  40 mg Oral Daily   [MAR Hold] Chlorhexidine Gluconate Cloth  6 each Topical Q0600   [MAR Hold] cinacalcet  90 mg Oral Q supper   [MAR Hold] clopidogrel  75 mg Oral Daily   [MAR Hold] darbepoetin (ARANESP) injection - DIALYSIS  200 mcg Intravenous Q Thu-HD   [MAR Hold] dorzolamide-timolol  1 drop Left Eye BID   [MAR Hold] heparin injection (subcutaneous)  5,000 Units Subcutaneous Q8H   [MAR Hold] insulin aspart  0-6 Units Subcutaneous TID WC   insulin glargine-yfgn  14 Units Subcutaneous QHS   [MAR Hold] lidocaine-prilocaine  1 application Topical UD   [MAR Hold] mouth rinse  15 mL Mouth Rinse BID   [MAR Hold] multivitamin  1 tablet Oral QHS   [MAR Hold] nutrition supplement (JUVEN)  1 packet Oral BID BM   [MAR Hold] pantoprazole sodium  40 mg Oral Daily   [MAR Hold] sevelamer carbonate  1.6 g Oral TID WC   [MAR Hold] thiamine  100 mg Oral Daily    Dialysis Orders: [facility] 3h 7mn   400/ 600   59.5kg   2/2 bath  Prof 2   RU AV fistula  Hep 4000  Mircera 200 mcg IV every 2 weeks - last 1/26 Ancef 2g 3 times weekly HD Covid neg 2/3, hep B SAg neg 2/4  Assessment/Plan: 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  Repeat MRI with no acute findings.  With PFO on TTE. TEE showed small PFO and no clot. Blood Cx 2/4 negative. S/p course of IV abx.  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 MRI of  orbits/sinuses completed this AM, with no abnormalities of orbit/sinuses noted. Neuro following: plan for LP after 5 days off of ASA and Plavix. Discussed with Neuro and Attending Dr. UDelena BaliNeuro to initiate possible plasma exchange and replacement fluid (FFP)-s/p R IJ temp HD cath by IR Dr. SSerafina Royalstoday. 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  Scheduled for R AKA 10/11/21 by Dr. BTrula Slade To go to CIR at some point.  ESRD - Usually TTS schedule - Tolerated yesterday's HD with net UF 1.2L-tolerated being in the chair. Plan  for HD 3/2. Trying to keep SBP > 100 on HD due to #1. Wants to continue HD at this time.  Continue to monitor behavior during HD and tolerate dialysis in chair prior to d/c. Sepsis/ shock - resolved  Anemia of CKD- last Hgb stable at 8.6, continue Aranesp 213mg q Thurs. Transfuse pRBC prn.  MBD: CorrCa high, Phos ok. Continue home sensipar/binders, hold VDRA for now.   HTN/volume - midodrine with HD. UF as tolerated w/ albumin support prn. Below prior EDW, will need to be lowered on d/c. Severe protein calorie malnutrition: NG tube in place with Nepro TF's + supplements. DMT2 - per PMD  Dispo - likely will need SNF placement.  CTobie Poet NP [facility] 10/11/2021,11:00 AM  LOS: 26 days

## 2021-10-11 NOTE — Anesthesia Preprocedure Evaluation (Signed)
Anesthesia Evaluation  ?Patient identified by MRN, date of birth, ID bandGeneral Assessment Comment:Arousable, answers few questions ? ?Reviewed: ?Allergy & Precautions, NPO status , Patient's Chart, lab work & pertinent test results ? ?History of Anesthesia Complications ?Negative for: history of anesthetic complications ? ?Airway ?Mallampati: III ? ?TM Distance: >3 FB ?Neck ROM: Full ? ? ? Dental ? ?(+) Poor Dentition, Loose, Dental Advisory Given,  ?  ?Pulmonary ?shortness of breath,  ?  ?breath sounds clear to auscultation ? ? ? ? ? ? Cardiovascular ?hypertension, Pt. on medications ?(-) angina+ Peripheral Vascular Disease  ?(-) Past MI  ?Rhythm:Regular  ??1. Left ventricular ejection fraction, by estimation, is 70 to 75%. The  ?left ventricle has hyperdynamic function.  ??2. Right ventricular systolic function is normal. The right ventricular  ?size is normal.  ??3. No left atrial/left atrial appendage thrombus was detected.  ??4. The mitral valve is normal in structure. Mild mitral valve  ?regurgitation.  ??5. The aortic valve is tricuspid. Aortic valve regurgitation is not  ?visualized. Aortic valve sclerosis is present, with no evidence of aortic  ?valve stenosis.  ??6. Small PFO. Very small PFO as tested with injection of agitated saline  ?wit ha few bubbles seen in LA. Not clearly visible with color doppler .  ?There is a small patent foramen ovale.  ?  ?Neuro/Psych ?PSYCHIATRIC DISORDERS Anxiety CVA   ? GI/Hepatic ?negative GI ROS, Neg liver ROS,   ?Endo/Other  ?diabetes, Poorly Controlled, Insulin DependentLab Results ?     Component                Value               Date                 ?     HGBA1C                   10.4 (H)            08/11/2021           ? ? Renal/GU ?ESRF and DialysisRenal diseaseLab Results ?     Component                Value               Date                 ?     CREATININE               5.20 (H)            10/11/2021           ?Lab Results ?      Component                Value               Date                 ?     K                        4.8                 10/11/2021           ?  ? ?  ?Musculoskeletal ? ? Abdominal ?  ?Peds ? Hematology ? ?(+) Blood dyscrasia, anemia , Lab Results ?  Component                Value               Date                 ?     WBC                      8.8                 10/10/2021           ?     HGB                      12.2                10/11/2021           ?     HCT                      36.0                10/11/2021           ?     MCV                      99.3                10/10/2021           ?     PLT                      310                 10/10/2021           ?   ?Anesthesia Other Findings ? ? Reproductive/Obstetrics ? ?  ? ? ? ? ? ? ? ? ? ? ? ? ? ?  ?  ? ? ? ? ? ? ? ? ?Anesthesia Physical ?Anesthesia Plan ? ?ASA: 4 ? ?Anesthesia Plan: General  ? ?Post-op Pain Management: Ofirmev IV (intra-op)*  ? ?Induction: Intravenous, Cricoid pressure planned and Rapid sequence ? ?PONV Risk Score and Plan: 3 and Dexamethasone and Ondansetron ? ?Airway Management Planned: Oral ETT ? ?Additional Equipment: None ? ?Intra-op Plan:  ? ?Post-operative Plan: Possible Post-op intubation/ventilation ? ?Informed Consent: I have reviewed the patients History and Physical, chart, labs and discussed the procedure including the risks, benefits and alternatives for the proposed anesthesia with the patient or authorized representative who has indicated his/her understanding and acceptance.  ? ? ? ?Dental advisory given ? ?Plan Discussed with: CRNA and Anesthesiologist ? ?Anesthesia Plan Comments:   ? ? ? ? ? ? ?Anesthesia Quick Evaluation ? ?

## 2021-10-11 NOTE — Anesthesia Postprocedure Evaluation (Signed)
Anesthesia Post Note ? ?Patient: Carolene Gitto ? ?Procedure(s) Performed: RIGHT ABOVE KNEE AMPUTATION (Right: Knee) ? ?  ? ?Patient location during evaluation: PACU ?Anesthesia Type: General ?Level of consciousness: awake and alert, oriented and patient cooperative ?Pain management: pain level controlled ?Vital Signs Assessment: post-procedure vital signs reviewed and stable ?Respiratory status: spontaneous breathing, nonlabored ventilation and respiratory function stable ?Cardiovascular status: blood pressure returned to baseline and stable ?Postop Assessment: no apparent nausea or vomiting ?Anesthetic complications: no ? ? ?No notable events documented. ? ?Last Vitals:  ?Vitals:  ? 10/11/21 1240 10/11/21 1300  ?BP: (!) 177/93   ?Pulse: 83 84  ?Resp: 16 20  ?Temp:  (!) 36.3 ?C  ?SpO2: 98%   ?  ?Last Pain:  ?Vitals:  ? 10/11/21 1155  ?TempSrc:   ?PainSc: 0-No pain  ? ? ?  ?  ?  ?  ?  ?  ? ?Jarome Matin Katrina Brosh ? ? ? ? ?

## 2021-10-11 NOTE — Op Note (Signed)
? ? ?  Patient name: [name] MRN: [id number] DOB: 08/02/1981 Sex: female ? ?10/11/2021 ?Pre-operative Diagnosis: Ischemic right below-knee amputation ?Post-operative diagnosis:  Same ?Surgeon:  [name] ?Assistants: Laurence Slate ?Procedure:   Right above-knee amputation ?Anesthesia: General ?Blood Loss: Minimal ?Specimens: Right leg ? ?Findings: Good capillary bleeding and healthy tissue at the amputation site ? ?Indications: This is a 41 year old female who has previously undergone right below-knee amputation for lower extremity infection.  Her below-knee amputation stump did not heal.  She has ischemic changes and wound breakdown.  Above-knee amputation was recommended ? ?Procedure:  The patient was identified in the holding area and taken to [place] [facility]  The patient was then placed supine on the table. general anesthesia was administered.  The patient was prepped and draped in the usual sterile fashion.  A time out was called and antibiotics were administered.  A PA was necessary to expedite the procedure.  She helped with exposure and suctioning as well as suture closure of the wound and dressing application. ? ?A fishmouth incision was made just proximal to the patella.  Cautery was used divide subcutaneous tissue down to the fascia which was divided with cautery.  I then circumferentially exposed the femur.  A periosteal elevator was used to elevate the periosteum.  A Gigli saw was then used to transect the femur, beveling the anterior surface.  I then divided the remaining subcutaneous tissue with cautery.  I isolated the neurovascular bundle and divided this between a clamp.  The leg was then removed as a specimen.  I then individually divided the artery and vein proximal to the cut edge of the femur.  The nerve was then anesthetized with Exparel and ligated with a silk tie proximal to the cut edge of the femur.  The wound was then copiously irrigated.  The subcutaneous tissue was then anesthetized  with Exparel.  Hemostasis was achieved.  The fascia was then reapproximated with interrupted 2-0 Vicryl suture and the skin was closed with staples.  Sterile dressings were applied.  There were no immediate complications. ? ? ?Disposition: To PACU stable ? ? ?V. [name], M.D., FACS ?[facility] [place] ?Office: [phone] ?Pager:  [phone]  ?

## 2021-10-11 NOTE — Transfer of Care (Signed)
Immediate Anesthesia Transfer of Care Note ? ?Patient: [name] ? ?Procedure(s) Performed: RIGHT ABOVE KNEE AMPUTATION (Right: Knee) ? ?Patient Location: PACU ? ?Anesthesia Type:General ? ?Level of Consciousness: sedated and responds to stimulation ? ?Airway & Oxygen Therapy: Patient Spontanous Breathing and Patient connected to face mask oxygen ? ?Post-op Assessment: Report given to RN and Post -op Vital signs reviewed and stable ? ?Post vital signs: Reviewed and stable ? ?Last Vitals:  ?Vitals Value Taken Time  ?BP 178/108 10/11/21 1153  ?Temp    ?Pulse 84 10/11/21 1159  ?Resp 15 10/11/21 1159  ?SpO2 100 % 10/11/21 1159  ?Vitals shown include unvalidated device data. ? ?Last Pain:  ?Vitals:  ? 10/11/21 0935  ?TempSrc:   ?PainSc: 9   ?   ? ?Patients Stated Pain Goal: 1 (10/02/21 2300) ? ?Complications: No notable events documented. ?

## 2021-10-11 NOTE — Plan of Care (Signed)
?  Problem: Education: ?Goal: Knowledge of General Education information will improve ?Description: Including pain rating scale, medication(s)/side effects and non-pharmacologic comfort measures ?Outcome: Progressing ?  ?Problem: Health Behavior/Discharge Planning: ?Goal: Ability to manage health-related needs will improve ?Outcome: Progressing ?  ?Problem: Clinical Measurements: ?Goal: Ability to maintain clinical measurements within normal limits will improve ?Outcome: Progressing ?Goal: Diagnostic test results will improve ?Outcome: Progressing ?  ?Problem: Pain Managment: ?Goal: General experience of comfort will improve ?Outcome: Progressing ?  ?Problem: Safety: ?Goal: Ability to remain free from injury will improve ?Outcome: Progressing ?  ?

## 2021-10-11 NOTE — Progress Notes (Incomplete)
Glucose up to 109 after intervention- and prior to start of D5-LR ?

## 2021-10-11 NOTE — Interval H&P Note (Signed)
History and Physical Interval Note: ? ?10/11/2021 ?10:52 AM ? ?[name]  has presented today for surgery, with the diagnosis of Critical limb ischemia.  The various methods of treatment have been discussed with the patient and family. After consideration of risks, benefits and other options for treatment, the patient has consented to  Procedure(s): ?RIGHT ABOVE KNEE AMPUTATION (Right) as a surgical intervention.  The patient's history has been reviewed, patient examined, no change in status, stable for surgery.  I have reviewed the patient's chart and labs.  Questions were answered to the patient's satisfaction.   ? ? ?[name] ? ? ?

## 2021-10-11 NOTE — Care Management Important Message (Signed)
Important Message ? ?Patient Details  ?Name: [name] ?MRN: [id number] ?Date of Birth: [birth year] ? ? ?Medicare Important Message Given:  Yes ? ? ? ? ?Levada Dy  Sherol Sabas-Martin ?10/11/2021, 2:03 PM ?

## 2021-10-11 NOTE — Progress Notes (Signed)
OT Cancellation Note ? ?Patient Details ?Name: [name] ?MRN: [id number] ?DOB: Jan 28, 1981 ? ? ?Cancelled Treatment:    Reason Eval/Treat Not Completed: Patient at procedure or test/ unavailable.  Will reattempt. ? ?Eusevio Schriver C., OTR/L ?[facility] ?Pager [phone] ?Office [phone] ? ? ?Anali Cabanilla M ?10/11/2021, 9:35 AM ?

## 2021-10-11 NOTE — Progress Notes (Signed)
PROGRESS NOTE  [name] FYB:[id number] DOB: 30-May-1981 DOA: 09/15/2021 PCP: [facility]   LOS: 26 days   Brief Narrative / Interim history: [name] is a 41 y.o. female with medical history significant for poorly controlled DMT2, ESRD on TTS HD, HTN with poor compliance with medical treatment and HD was sent after completing dialysis session due to staff concern for altered mental status.  She has a recent history of TMA of the right foot about 2 months ago, grew MSSA and was on Ancef with dialysis.  Vascular surgery as well as nephrology consulted.  Hospital course complicated by an episode of lethargy, minimal responsiveness on 2/10 requiring rapid response.  An MRI of the brain showed scattered punctate acute infarcts bilaterally.  Neurology consulted.  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  Neurology recommends Plex, HD catheter placed this morning  Subjective / 24h Interval events: Keeps her eyes closed.  Doing well, denies any shortness of breath, denies any chest pain, no nausea or vomiting.  Assesement and Plan: Principal Problem:   Osteomyelitis (Arial) Active Problems:   Anemia of chronic disease   Hyperglycemia due to type 2 diabetes mellitus (HCC)   ESRD on hemodialysis (HCC)   Acute encephalopathy   Cerebral embolism with cerebral infarction   Pressure injury of skin   PFO (patent foramen ovale)   Malnutrition of moderate degree   Ptosis of both eyelids   Assessment and Plan: Principal problem Embolic stroke, with positive bubble study -neurology consulted and following.  An MRI of the brain 2/10 showed scattered punctate acute infarcts throughout both cerebral hemispheres which appeared to be embolic. A transesophageal echo done on 2/20 showed small PFO with no thrombus.  Completed stroke work-up, neurology following.  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  ESRD-nephrology following.  Per notes, she is disruptive in the dialysis unit  Hypotension-previously on midodrine, now off  Moderate malnutrition-status post NG feeding tube, now removed  Osteomyelitis of the right foot-with history of BKA, concern for eschar formation in the right stump, will have AKA by vascular  DM2-with an episode of hypoglycemia overnight last night, decrease long-acting insulin this morning  CBG (last 3)  Recent Labs    10/11/21 0755 10/11/21 0903 10/11/21 0928  GLUCAP 71 74 71    Scheduled Meds:  [MAR Hold] aspirin EC  81 mg Oral Daily   [MAR Hold] atorvastatin  40 mg Oral Daily   chlorhexidine  15 mL Mouth/Throat Once   Or   mouth rinse  15 mL Mouth Rinse Once   [MAR Hold] Chlorhexidine Gluconate Cloth  6 each Topical Q0600   [MAR Hold] cinacalcet  90 mg Oral Q supper   [MAR Hold] clopidogrel  75 mg Oral Daily   [MAR Hold] darbepoetin (ARANESP) injection - DIALYSIS  200 mcg Intravenous Q Thu-HD   [MAR Hold] dorzolamide-timolol  1 drop Left Eye BID   [MAR Hold] heparin injection (subcutaneous)  5,000 Units Subcutaneous Q8H   [MAR Hold] insulin aspart  0-6 Units Subcutaneous TID WC   [MAR Hold] insulin glargine-yfgn  18 Units Subcutaneous QHS   [MAR Hold] lidocaine-prilocaine  1 application Topical UD   [MAR Hold] mouth rinse  15 mL Mouth Rinse BID   [MAR Hold] multivitamin  1 tablet Oral QHS   [MAR Hold] nutrition supplement (JUVEN)  1 packet Oral BID BM   [MAR Hold] pantoprazole sodium  40 mg Oral Daily   [MAR Hold] sevelamer carbonate  1.6 g Oral TID WC   [MAR Hold] thiamine  100 mg Oral Daily   Continuous Infusions:  sodium chloride     dextrose 5% lactated ringers 30 mL/hr at 10/11/21 0353   [MAR Hold] thiamine injection     [MAR Hold] thiamine injection 500 mg (10/10/21 2153)   PRN Meds:.[MAR Hold] acetaminophen, [MAR Hold] alteplase, [MAR  Hold] alum & mag hydroxide-simeth, [MAR Hold] guaiFENesin-dextromethorphan, [MAR Hold] heparin, [MAR Hold] heparin, insulin aspart, [MAR Hold] iohexol, [MAR Hold] lidocaine (PF), [MAR Hold] lidocaine-prilocaine, [MAR Hold] pentafluoroprop-tetrafluoroeth  Diet Orders (From admission, onward)     Start     Ordered   10/11/21 0001  Diet NPO time specified Except for: Sips with Meds  Diet effective midnight       Comments: Hold hypoglycemics and diuretic medications  Question:  Except for  Answer:  Sips with Meds   10/10/21 1700            DVT prophylaxis: heparin injection 5,000 Units Start: 10/09/21 1400 SCD's Start: 09/17/21 1002   Lab Results  Component Value Date   PLT 310 10/10/2021      Code Status: Full Code  Family Communication: s/o at bedside  Status is: Inpatient  Remains inpatient appropriate because: severity of illness   Level of care: Telemetry Medical  Consultants:  Neurology  Procedures:  2D echo  Microbiology  none  Antimicrobials: none    Objective: Vitals:   10/10/21 1822 10/11/21 0742 10/11/21 0905 10/11/21 0929  BP: (!) 149/76 (!) 152/72 (!) 158/79 (!) 154/86  Pulse: 90 88 90   Resp: _0 Temp: 98.3 F (36.8 C) 98.4 F (36.9 C) 98 F (36.7 C) 97.8 F (36.6 C)  TempSrc: Oral Oral Oral Oral  SpO2: 100% 99% 100%   Weight:      Height:        Intake/Output Summary (Last 24 hours) at 10/11/2021 0948 Last data filed at 10/10/2021 2100 Gross per 24 hour  Intake 60 ml  Output 1280 ml  Net -1220 ml   Wt Readings from Last 3 Encounters:  10/10/21 59.2 kg  09/04/21 60.8 kg  08/25/21 61 kg    Examination:  Constitutional: NAD Eyes: no scleral icterus ENMT: Mucous membranes are moist.  Neck: normal, supple Respiratory: clear to auscultation bilaterally, no wheezing, no crackles. Normal respiratory effort. No accessory muscle use.  Cardiovascular: Regular rate and rhythm, no murmurs / rubs / gallops. No LE  edema. Abdomen: non distended, no tenderness. Bowel sounds positive.  Musculoskeletal: no clubbing / cyanosis.  Skin: no rashes  Data Reviewed: I have independently reviewed following labs and imaging studies   CBC Recent Labs  Lab 10/05/21 0807 10/07/21 1152 10/09/21 1204 10/10/21 1418  WBC 10.5 9.7 9.5 8.8  HGB 8.6* 8.7* 8.6* 8.6*  HCT 29.0* 30.0* 29.1* 29.4*  PLT 268 298 287 310  MCV 97.0 98.7 99.0 99.3  MCH 28.8 28.6 29.3 29.1  MCHC 29.7* 29.0* 29.6* 29.3*  RDW 25.9* 25.1* 23.9* 23.6*    Recent Labs  Lab 10/04/21 1951 10/05/21 0644 10/05/21 0807 10/07/21 1152 10/09/21 1204 10/10/21 1418  NA  --   --  133* 131* 133* 128*  K  --   --  5.2* 5.7* 5.1 5.5*  CL  --   --  93* 92* 96* 92*  CO2  --   --  _0 GLUCOSE  --   --  210* 188* 223* 184*  BUN  --   --  61* 84* 84* 110*  CREATININE  --   --  7.23* 7.81* 6.52* 7.61*  CALCIUM  --   --  9.2 10.2 10.5* 10.7*  AST  --   --   --   --  20  --   ALT  --   --   --   --  15  --   ALKPHOS  --   --   --   --  96  --   BILITOT  --   --   --   --  0.3  --   ALBUMIN  --   --  2.1* 2.1* 2.1* 2.1*  MG 2.3 2.5*  --   --   --   --     ------------------------------------------------------------------------------------------------------------------ No results for input(s): CHOL, HDL, LDLCALC, TRIG, CHOLHDL, LDLDIRECT in the last 72 hours.  Lab Results  Component Value Date   HGBA1C 10.4 (H) 08/11/2021   ------------------------------------------------------------------------------------------------------------------ No results for input(s): TSH, T4TOTAL, T3FREE, THYROIDAB in the last 72 hours.  Invalid input(s): FREET3  Cardiac Enzymes No results for input(s): CKMB, TROPONINI, MYOGLOBIN in the last 168 hours.  Invalid input(s): CK ------------------------------------------------------------------------------------------------------------------ No results found for: BNP  CBG: Recent Labs  Lab 10/11/21 0356  10/11/21 0612 10/11/21 0755 10/11/21 0903 10/11/21 0928  GLUCAP 109* 75 71 74 71    No results found for this or any previous visit (from the past 240 hour(s)).   Radiology Studies: IR Fluoro Guide CV Line Right  Result Date: 10/11/2021 INDICATION: 41 year old female with history of Guillain-Barre syndrome requiring central venous access for PLEX. EXAM: NON-TUNNELED CENTRAL VENOUS HEMODIALYSIS CATHETER PLACEMENT WITH ULTRASOUND AND FLUOROSCOPIC GUIDANCE COMPARISON:  None. MEDICATIONS: None FLUOROSCOPY TIME:  0 minutes, 6 seconds (2 mGy) COMPLICATIONS: None immediate. PROCEDURE: Informed written consent was obtained from the patient after a discussion of the risks, benefits, and alternatives to treatment. Questions regarding the procedure were encouraged and answered. The right neck and chest were prepped with chlorhexidine in a sterile fashion, and a sterile drape was applied covering the operative field. Maximum barrier sterile technique with sterile gowns and gloves were used for the procedure. A timeout was performed prior to the initiation of the procedure. Preprocedure ultrasound evaluation demonstrated occlusion of the right internal jugular vein. The right external jugular vein was patent. After the overlying soft tissues were anesthetized, a small venotomy incision was created and a micropuncture kit was utilized to access the right external jugular vein. Real-time ultrasound guidance was utilized for vascular access including the acquisition of a permanent ultrasound image documenting patency of the accessed vessel. The microwire was utilized to measure appropriate catheter length. A Rosen wire was advanced to the level of the IVC. Under fluoroscopic guidance, the venotomy was serially dilated, ultimately allowing placement of a 20 cm temporary Trialysis catheter with tip ultimately terminating within the superior aspect of the right atrium. Final catheter positioning was confirmed and  documented with a spot radiographic image. The catheter aspirates and flushes normally. The catheter was flushed with appropriate volume heparin dwells. The catheter exit site was secured with a 0 silk retention suture. A dressing was placed. The patient tolerated the procedure well without immediate post procedural complication. IMPRESSION: 1. Successful placement of a right external jugular vein approach 20 cm temporary dialysis catheter with tip terminating with in the superior aspect of the  right atrium. The catheter is ready for immediate use. 2. 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 Ruthann Cancer, MD [facility] Electronically Signed   By: Ruthann Cancer M.D.   On: 10/11/2021 08:52   IR US Guide Vasc Access Right  Result Date: 10/11/2021 INDICATION: 41 year old female with history of Guillain-Barre syndrome requiring central venous access for PLEX. EXAM: NON-TUNNELED CENTRAL VENOUS HEMODIALYSIS CATHETER PLACEMENT WITH ULTRASOUND AND FLUOROSCOPIC GUIDANCE COMPARISON:  None. MEDICATIONS: None FLUOROSCOPY TIME:  0 minutes, 6 seconds (2 mGy) COMPLICATIONS: None immediate. PROCEDURE: Informed written consent was obtained from the patient after a discussion of the risks, benefits, and alternatives to treatment. Questions regarding the procedure were encouraged and answered. The right neck and chest were prepped with chlorhexidine in a sterile fashion, and a sterile drape was applied covering the operative field. Maximum barrier sterile technique with sterile gowns and gloves were used for the procedure. A timeout was performed prior to the initiation of the procedure. Preprocedure ultrasound evaluation demonstrated occlusion of the right internal jugular vein. The right external jugular vein was patent. After the overlying soft tissues were anesthetized, a small venotomy incision was created and a micropuncture kit was utilized to access the  right external jugular vein. Real-time ultrasound guidance was utilized for vascular access including the acquisition of a permanent ultrasound image documenting patency of the accessed vessel. The microwire was utilized to measure appropriate catheter length. A Rosen wire was advanced to the level of the IVC. Under fluoroscopic guidance, the venotomy was serially dilated, ultimately allowing placement of a 20 cm temporary Trialysis catheter with tip ultimately terminating within the superior aspect of the right atrium. Final catheter positioning was confirmed and documented with a spot radiographic image. The catheter aspirates and flushes normally. The catheter was flushed with appropriate volume heparin dwells. The catheter exit site was secured with a 0 silk retention suture. A dressing was placed. The patient tolerated the procedure well without immediate post procedural complication. IMPRESSION: 1. Successful placement of a right external jugular vein approach 20 cm temporary dialysis catheter with tip terminating with in the superior aspect of the right atrium. The catheter is ready for immediate use. 2. 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 Ruthann Cancer, MD [facility] Radiology Electronically Signed   By: Ruthann Cancer M.D.   On: 10/11/2021 08:52     Marzetta Board, MD, PhD [facility]  Between 7 am - 7 pm I am available, please contact me via Amion (for emergencies) or Securechat (non urgent messages)  Between 7 pm - 7 am I am not available, please contact night coverage MD/APP via Amion

## 2021-10-11 NOTE — Progress Notes (Signed)
The floor nurse mentioned in report that patient was NPO after 23:00 o'clock on 10/10/21. On the night nurse note is documented that patient received Glucerna when CBG was 49 @ 03:19 o'clock. Dr. Ermalene Postin was notified. ?

## 2021-10-11 NOTE — Interval H&P Note (Signed)
History and Physical Interval Note: ? ?10/11/2021 ?10:52 AM ? ?[name]  has presented today for surgery, with the diagnosis of Critical limb ischemia.  The various methods of treatment have been discussed with the patient and family. After consideration of risks, benefits and other options for treatment, the patient has consented to  Procedure(s): ?RIGHT ABOVE KNEE AMPUTATION (Right) as a surgical intervention.  The patient's history has been reviewed, patient examined, no change in status, stable for surgery.  I have reviewed the patient's chart and labs.  Questions were answered to the patient's satisfaction.   ? ? ?Wells Edgar Reisz ? ? ?

## 2021-10-11 NOTE — Progress Notes (Addendum)
Pt reports her blood sugar is low- 49 on finger stick- provided with glucerna- re check in a hour. Noted long acting insuline was given at 2209. ?

## 2021-10-11 NOTE — Progress Notes (Signed)
Inpatient Rehab Admissions Coordinator:  ? ?Pt. To OR today. I will follow for potential CIR admit if appropriate once she is working with therapies again.  ? ?Clemens Catholic, MS, CCC-SLP ?Rehab Admissions Coordinator  ?[phone] (celll) ?[phone] (office) ? ?

## 2021-10-11 NOTE — Anesthesia Procedure Notes (Signed)
Procedure Name: Intubation ?Date/Time: 10/11/2021 10:40 AM ?Performed by: Myna Bright, CRNA ?Pre-anesthesia Checklist: Patient identified, Emergency Drugs available, Suction available and Patient being monitored ?Patient Re-evaluated:Patient Re-evaluated prior to induction ?Oxygen Delivery Method: Circle system utilized ?Preoxygenation: Pre-oxygenation with 100% oxygen ?Induction Type: IV induction, Rapid sequence and Cricoid Pressure applied ?Laryngoscope Size: Mac and 3 ?Grade View: Grade III ?Tube type: Oral ?Tube size: 7.0 mm ?Number of attempts: 1 ?Airway Equipment and Method: Stylet ?Placement Confirmation: ETT inserted through vocal cords under direct vision, positive ETCO2 and breath sounds checked- equal and bilateral ?Secured at: 21 cm ?Tube secured with: Tape ?Dental Injury: Teeth and Oropharynx as per pre-operative assessment  ?Difficulty Due To: Difficult Airway- due to dentition, Difficulty was unanticipated, Difficult Airway-  due to edematous airway and Difficult Airway- due to limited oral opening ?Future Recommendations: Recommend- induction with short-acting agent, and alternative techniques readily available ?Comments: Patient with very prominent, very loose front upper tooth. RSI d/t questionable full stomach. DL x 1. View of epiglottis only. ETT directed anterior to epiglottis while maintaining cricoid pressure. +EtCO2, +BBS. Small abrasion noted on upper lip, teeth remain as preop.  ? ? ? ? ?

## 2021-10-12 ENCOUNTER — Encounter (HOSPITAL_COMMUNITY): Payer: Self-pay | Admitting: Surgery

## 2021-10-12 DIAGNOSIS — M869 Osteomyelitis, unspecified: Secondary | ICD-10-CM | POA: Diagnosis not present

## 2021-10-12 DIAGNOSIS — G610 Guillain-Barre syndrome: Secondary | ICD-10-CM | POA: Diagnosis not present

## 2021-10-12 LAB — THERAPEUTIC PLASMA EXCHANGE (BLOOD BANK)
Plasma Exchange: 2100
Plasma volume needed: 2100
Unit division: 0
Unit division: 0
Unit division: 0
Unit division: 0

## 2021-10-12 LAB — CBC WITH DIFFERENTIAL/PLATELET
Abs Immature Granulocytes: 0.03 10*3/uL (ref 0.00–0.07)
Basophils Absolute: 0.1 10*3/uL (ref 0.0–0.1)
Basophils Relative: 1 %
Eosinophils Absolute: 0.5 10*3/uL (ref 0.0–0.5)
Eosinophils Relative: 4 %
HCT: 35.6 % — ABNORMAL LOW (ref 36.0–46.0)
Hemoglobin: 10.2 g/dL — ABNORMAL LOW (ref 12.0–15.0)
Immature Granulocytes: 0 %
Lymphocytes Relative: 20 %
Lymphs Abs: 2.3 10*3/uL (ref 0.7–4.0)
MCH: 28.8 pg (ref 26.0–34.0)
MCHC: 28.7 g/dL — ABNORMAL LOW (ref 30.0–36.0)
MCV: 100.6 fL — ABNORMAL HIGH (ref 80.0–100.0)
Monocytes Absolute: 1.1 10*3/uL — ABNORMAL HIGH (ref 0.1–1.0)
Monocytes Relative: 9 %
Neutro Abs: 7.9 10*3/uL — ABNORMAL HIGH (ref 1.7–7.7)
Neutrophils Relative %: 66 %
Platelets: 318 10*3/uL (ref 150–400)
RBC: 3.54 MIL/uL — ABNORMAL LOW (ref 3.87–5.11)
RDW: 22.5 % — ABNORMAL HIGH (ref 11.5–15.5)
WBC: 11.9 10*3/uL — ABNORMAL HIGH (ref 4.0–10.5)
nRBC: 0.2 % (ref 0.0–0.2)

## 2021-10-12 LAB — RENAL FUNCTION PANEL
Albumin: 2.9 g/dL — ABNORMAL LOW (ref 3.5–5.0)
Anion gap: 12 (ref 5–15)
BUN: 56 mg/dL — ABNORMAL HIGH (ref 6–20)
CO2: 27 mmol/L (ref 22–32)
Calcium: 11.2 mg/dL — ABNORMAL HIGH (ref 8.9–10.3)
Chloride: 96 mmol/L — ABNORMAL LOW (ref 98–111)
Creatinine, Ser: 5.79 mg/dL — ABNORMAL HIGH (ref 0.44–1.00)
GFR, Estimated: 9 mL/min — ABNORMAL LOW (ref >60)
Glucose, Bld: 77 mg/dL (ref 70–99)
Phosphorus: 5.8 mg/dL — ABNORMAL HIGH (ref 2.5–4.6)
Potassium: 4.8 mmol/L (ref 3.5–5.1)
Sodium: 135 mmol/L (ref 135–145)

## 2021-10-12 LAB — GLUCOSE, CAPILLARY
Glucose-Capillary: 100 mg/dL — ABNORMAL HIGH (ref 70–99)
Glucose-Capillary: 136 mg/dL — ABNORMAL HIGH (ref 70–99)
Glucose-Capillary: 50 mg/dL — ABNORMAL LOW (ref 70–99)
Glucose-Capillary: 52 mg/dL — ABNORMAL LOW (ref 70–99)
Glucose-Capillary: 52 mg/dL — ABNORMAL LOW (ref 70–99)
Glucose-Capillary: 76 mg/dL (ref 70–99)
Glucose-Capillary: 79 mg/dL (ref 70–99)
Glucose-Capillary: 95 mg/dL (ref 70–99)

## 2021-10-12 LAB — VITAMIN B1

## 2021-10-12 LAB — SURGICAL PATHOLOGY

## 2021-10-12 MED ORDER — GLUCOSE 40 % PO GEL
ORAL | Status: AC
  Administered 2021-10-12: 62 g via ORAL
  Filled 2021-10-12: qty 1

## 2021-10-12 MED ORDER — CLOPIDOGREL BISULFATE 75 MG PO TABS
75.0000 mg | ORAL_TABLET | Freq: Every day | ORAL | Status: DC
  Administered 2021-10-13 – 2021-10-14 (×2): 75 mg via ORAL
  Filled 2021-10-12 (×2): qty 1

## 2021-10-12 MED ORDER — CHLORHEXIDINE GLUCONATE 0.12 % MT SOLN
15.0000 mL | Freq: Once | OROMUCOSAL | Status: AC
  Administered 2021-10-12: 15 mL via OROMUCOSAL
  Filled 2021-10-12: qty 15

## 2021-10-12 MED ORDER — DIPHENHYDRAMINE HCL 25 MG PO CAPS
25.0000 mg | ORAL_CAPSULE | Freq: Four times a day (QID) | ORAL | Status: DC | PRN
  Administered 2021-10-13: 25 mg via ORAL
  Filled 2021-10-12: qty 1

## 2021-10-12 MED ORDER — ORAL CARE MOUTH RINSE: 15.0000 mL | Freq: Once | OROMUCOSAL | Status: AC

## 2021-10-12 MED ORDER — OXYCODONE-ACETAMINOPHEN 5-325 MG PO TABS
1.0000 | ORAL_TABLET | Freq: Three times a day (TID) | ORAL | Status: DC | PRN
  Administered 2021-10-12 – 2021-10-16 (×8): 1 via ORAL
  Filled 2021-10-12 (×8): qty 1

## 2021-10-12 MED ORDER — ACETAMINOPHEN 325 MG PO TABS: 650.0000 mg | ORAL_TABLET | ORAL | Status: DC | PRN

## 2021-10-12 MED ORDER — GLUCOSE 40 % PO GEL: 2.0000 | ORAL | Status: AC

## 2021-10-12 MED ORDER — HEPARIN SODIUM (PORCINE) 1000 UNIT/ML IJ SOLN: 1000.0000 [IU] | Freq: Once | INTRAMUSCULAR | Status: DC

## 2021-10-12 MED ORDER — ACD FORMULA A 0.73-2.45-2.2 GM/100ML VI SOLN: 1000.0000 mL | Status: DC

## 2021-10-12 MED ORDER — PHENOL 1.4 % MT LIQD
1.0000 | OROMUCOSAL | Status: DC | PRN
  Administered 2021-10-12: 1 via OROMUCOSAL
  Filled 2021-10-12 (×2): qty 177

## 2021-10-12 MED ORDER — CALCIUM GLUCONATE-NACL 2-0.675 GM/100ML-% IV SOLN: 2.0000 g | Freq: Once | INTRAVENOUS | Status: DC

## 2021-10-12 MED ORDER — ASPIRIN EC 81 MG PO TBEC
81.0000 mg | DELAYED_RELEASE_TABLET | Freq: Every day | ORAL | Status: DC
  Administered 2021-10-13 – 2021-10-20 (×7): 81 mg via ORAL
  Filled 2021-10-12 (×7): qty 1

## 2021-10-12 MED ORDER — LACTATED RINGERS IV SOLN: INTRAVENOUS | Status: DC

## 2021-10-12 NOTE — Progress Notes (Signed)
Occupational Therapy Treatment ?Patient Details ?Name: [name] ?MRN: [id number] ?DOB: 1980/10/31 ?Today's Date: 10/12/2021 ? ? ?History of present illness Pt is a 41 y.o. female admitted 09/15/21 post-HD session with concern for AMS when pt stopped speaking to staff. 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 Pt with increased somnolence 2/6; noted to be more lethargic in HD 2/7, 2/9, 2/11. Head CT 2/10 negative for acute injury; chronic small vessel disease and infarcts. MRI 2/10 with scattered punctate acute infarcts bilateral cerebral hemispheres (may be embolic), potential subacute infarct L corona radiata. Pt with persistent bilateral ptosis (pt reports ongoing ~1 month). MRI orbits 2/25 with evolution of infarcts seen on prior study; no acute infarct; chronic R retinal detachment. Per neurology 2/28, high suspicion for miller fischer GBS; to proceed with PLEx treatment. S/p R EJ temporary HD cath placement 3/1. 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 PMH includes ESRD (HD TTS), DM2, HTN, neuropathy, R eye blindness (2008). ?  ?OT comments ? Pt reported feeling very tired from having pain medication in today's session. Pt was able to complete rolling side to side in bed with min guard while total for peri care was completed. Pt agreed for further sessions but di not want to complete anything further today due to pain and fatigue.   ? ?Recommendations for follow up therapy are one component of a multi-disciplinary discharge planning process, led by the attending physician.  Recommendations may be updated based on patient status, additional functional criteria and insurance authorization. ?   ?Follow Up Recommendations ? Acute inpatient rehab (3hours/day)  ?  ?Assistance Recommended at Discharge Frequent or constant Supervision/Assistance  ?Patient can return home with the following ? A lot of help with walking and/or transfers;A lot of help with bathing/dressing/bathroom;Assistance with  cooking/housework;Assistance with feeding;Direct supervision/assist for medications management;Direct supervision/assist for financial management;Assist for transportation ?  ?Equipment Recommendations ? None recommended by OT  ?  ?Recommendations for Other Services   ? ?  ?Precautions / Restrictions Precautions ?Precautions: Fall ?Precaution Comments: Rt BKA, new bil ptosis with blindness, bowel incontinence ?Required Braces or Orthoses: Other Brace ?Other Brace: RLE limb guard ?Restrictions ?Weight Bearing Restrictions: Yes ?RUE Weight Bearing: Weight bearing as tolerated ?RLE Weight Bearing: Non weight bearing  ? ? ?  ? ?Mobility Bed Mobility ?Overal bed mobility: Needs Assistance ?Bed Mobility: Rolling ?Rolling: Min guard ?  ?  ?  ?  ?General bed mobility comments: Pt once set up with positioning was able to complete rolling side to side with bed rails to complete peri care. Pt required min assist just to be able to increase rotatioin onto hip when completion for peri care. ?  ? ?Transfers ?  ?  ?  ?  ?  ?  ?  ?  ?  ?  ?  ?  ?Balance   ?  ?  ?  ?  ?  ?  ?  ?  ?  ?  ?  ?  ?  ?  ?  ?  ?  ?  ?   ? ?ADL either performed or assessed with clinical judgement  ? ?ADL Overall ADL's : Needs assistance/impaired ?Eating/Feeding: Moderate assistance;Bed level ?  ?  ?  ?  ?  ?  ?  ?  ?  ?  ?  ?  ?  ?Toileting- Clothing Manipulation and Hygiene: Total assistance;Bed level ?  ?  ?  ?  ?General ADL Comments: Pt in today session only reported  wanting to complete bed mobility to ensure peri area was clean. ?  ? ?Extremity/Trunk Assessment Upper Extremity Assessment ?Upper Extremity Assessment: Generalized weakness ?RUE Deficits / Details: edema noted upper arm ?RUE Coordination: decreased gross motor;decreased fine motor ?  ?Lower Extremity Assessment ?Lower Extremity Assessment: Defer to PT evaluation ?  ?  ?  ? ?Vision   ?Additional Comments: Pt fully blind ?  ?Perception Perception ?Perception: Impaired ?  ?Praxis  Praxis ?Praxis: Impaired ?Praxis Impairment Details: Initiation;Motor planning ?Praxis-Other Comments: Difficulties with assessment with motor planning ?  ? ?Cognition Arousal/Alertness: Awake/alert ?Behavior During Therapy: Flat affect ?Overall Cognitive Status: Impaired/Different from baseline ?Area of Impairment: Following commands, Problem solving, Attention ?  ?  ?  ?  ?  ?  ?  ?  ?Orientation Level: Person, Place, Time, Situation ?Current Attention Level: Sustained ?Memory: Decreased recall of precautions, Decreased short-term memory ?Following Commands: Follows one step commands with increased time ?Safety/Judgement: Decreased awareness of safety, Decreased awareness of deficits ?Awareness: Emergent ?Problem Solving: Slow processing, Difficulty sequencing, Requires verbal cues, Requires tactile cues ?General Comments: Pt noted to perseverate on thier foot is gone ?  ?  ?   ?Exercises   ? ?  ?Shoulder Instructions   ? ? ?  ?General Comments    ? ? ?Pertinent Vitals/ Pain       Pain Assessment ?Pain Assessment: Faces ?Faces Pain Scale: Hurts even more ?Pain Descriptors / Indicators: Grimacing, Throbbing ?Pain Intervention(s): Limited activity within patient's tolerance ? ?Home Living   ?  ?  ?  ?  ?  ?  ?  ?  ?  ?  ?  ?  ?  ?  ?  ?  ?  ?  ? ?  ?Prior Functioning/Environment    ?  ?  ?  ?   ? ?Frequency ? Min 2X/week  ? ? ? ? ?  ?Progress Toward Goals ? ?OT Goals(current goals can now be found in the care plan section) ? Progress towards OT goals: Progressing toward goals ? ?Acute Rehab OT Goals ?Patient Stated Goal: to rest ?OT Goal Formulation: With patient ?Time For Goal Achievement: 10/17/21 ?Potential to Achieve Goals: Fair ?ADL Goals ?Pt Will Perform Eating: with set-up;sitting;with adaptive utensils ?Pt Will Perform Grooming: with set-up;with supervision;sitting ?Pt Will Perform Upper Body Bathing: with set-up;with supervision;sitting ?Pt Will Perform Lower Body Bathing: with min assist;sit to/from  stand ?Pt Will Perform Upper Body Dressing: with supervision;with set-up;sitting ?Pt Will Perform Lower Body Dressing: with mod assist;sitting/lateral leans ?Pt Will Transfer to Toilet: with mod assist;stand pivot transfer;bedside commode ?Pt Will Perform Toileting - Clothing Manipulation and hygiene: sitting/lateral leans;with caregiver independent in assisting;with modified independence ?Additional ADL Goal #1: Pt will be S to come up to sit EOB ?Additional ADL Goal #2: Pt will be alert and oriented x4 without cues  ?Plan Discharge plan remains appropriate   ? ?Co-evaluation ? ? ? PT/OT/SLP Co-Evaluation/Treatment: Yes ?Reason for Co-Treatment: Complexity of the patient's impairments (multi-system involvement) ?  ?OT goals addressed during session: ADL's and self-care ?  ? ?  ?AM-PAC OT "6 Clicks" Daily Activity     ?Outcome Measure ? ? Help from another person eating meals?: A Lot ?Help from another person taking care of personal grooming?: A Little ?Help from another person toileting, which includes using toliet, bedpan, or urinal?: Total ?Help from another person bathing (including washing, rinsing, drying)?: A Lot ?Help from another person to put on and taking off regular upper body clothing?: Total ?Help  from another person to put on and taking off regular lower body clothing?: Total ?6 Click Score: 10 ? ?  ?End of Session   ? ?OT Visit Diagnosis: Other abnormalities of gait and mobility (R26.89);Unsteadiness on feet (R26.81);Muscle weakness (generalized) (M62.81);Other symptoms and signs involving cognitive function;Low vision, both eyes (H54.2) ?Pain - Right/Left: Right ?Pain - part of body: Ankle and joints of foot ?  ?Activity Tolerance Patient limited by fatigue;Patient limited by pain ?  ?Patient Left in bed;with call bell/phone within reach;with bed alarm set ?  ?Nurse Communication   ?  ? ?   ? ?Time: 0945-1000 ?OT Time Calculation (min): 15 min ? ?Charges: OT General Charges ?$OT Visit: 1 Visit ?OT  Treatments ?$Self Care/Home Management : 8-22 mins ? ?Joeseph Amor OTR/L  ?Acute Rehab Services  ?[phone] office number ?[phone] pager number ? ? ?Joeseph Amor ?10/12/2021, 11:36 AM ?

## 2021-10-12 NOTE — Progress Notes (Signed)
At 0426 CBG was 76 patient c/o of sweating and feeling symptomatic was applesauce and some regular ginger she drank 1/4 of drink but ate the container of applesauce. Will continue to monitor. Arthor Captain LPN ?

## 2021-10-12 NOTE — Progress Notes (Signed)
PROGRESS NOTE  [name] FUX:[id number] DOB: 10/04/1980 DOA: 09/15/2021 PCP: [facility]   LOS: 27 days   Brief Narrative / Interim history: [name] is a 41 y.o. female with medical history significant for poorly controlled DMT2, ESRD on TTS HD, HTN with poor compliance with medical treatment and HD was sent after completing dialysis session due to staff concern for altered mental status.  She has a recent history of TMA of the right foot about 2 months ago, grew MSSA and was on Ancef with dialysis.  Vascular surgery as well as nephrology consulted.  Hospital course complicated by an episode of lethargy, minimal responsiveness on 2/10 requiring rapid response.  An MRI of the brain showed scattered punctate acute infarcts bilaterally.  Neurology consulted.  There is also suspicion for [name] [name] given facial diplegia, ptosis, unreactive pupil.  Neurology recommends Plex, HD catheter placed this morning  Subjective / 24h Interval events: Just woke up.  Denies any shortness of breath, denies any abdominal pain, no nausea or vomiting.  Assesement and Plan: Principal Problem:   Osteomyelitis ([place]) Active Problems:   Anemia of chronic disease   Hyperglycemia due to type 2 diabetes mellitus (HCC)   ESRD on hemodialysis (HCC)   Acute encephalopathy   Cerebral embolism with cerebral infarction   Pressure injury of skin   PFO (patent foramen ovale)   Malnutrition of moderate degree   Ptosis of both eyelids   Assessment and Plan: Principal problem Embolic stroke, with positive bubble study -neurology consulted and following.  An MRI of the brain 2/10 showed scattered punctate acute infarcts throughout both cerebral hemispheres which appeared to be embolic. A transesophageal echo done on 2/20 showed small PFO with no thrombus.  Completed stroke work-up, neurology following.  Currently she is on statin, aspirin and Plavix  Active problems Concern for  [name] [name] -Per neurology, had catheter placed 3/1, started Plex.  States that she feels a little bit stronger  ESRD-nephrology following, continue HD as scheduled  Hypotension-previously on midodrine, now off  Moderate malnutrition-has a core track in place.  Encourage p.o. 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 by vascular surgery  DM2-continue glargine and sliding scale  CBG (last 3)  Recent Labs    10/12/21 0019 10/12/21 0405 10/12/21 0618  GLUCAP 136* 76 95     Scheduled Meds:  aspirin EC  81 mg Oral Daily   atorvastatin  40 mg Oral Daily   Chlorhexidine Gluconate Cloth  6 each Topical Q0600   cinacalcet  90 mg Oral Q supper   clopidogrel  75 mg Oral Daily   darbepoetin (ARANESP) injection - DIALYSIS  200 mcg Intravenous Q Thu-HD   dorzolamide-timolol  1 drop Left Eye BID   heparin injection (subcutaneous)  5,000 Units Subcutaneous Q8H   heparin sodium (porcine)  1,000 Units Intracatheter Once   insulin aspart  0-6 Units Subcutaneous TID WC   insulin glargine-yfgn  14 Units Subcutaneous QHS   lidocaine-prilocaine  1 application Topical UD   mouth rinse  15 mL Mouth Rinse BID   multivitamin  1 tablet Oral QHS   nutrition supplement (JUVEN)  1 packet Oral BID BM   pantoprazole sodium  40 mg Oral Daily   sevelamer carbonate  1.6 g Oral TID WC   [START ON 10/15/2021]  thiamine  100 mg Oral Daily   Continuous Infusions:  calcium gluconate     citrate dextrose     dextrose 5% lactated ringers Stopped (10/11/21 0918)   lactated ringers     thiamine injection 250 mg (10/11/21 2145)   PRN Meds:.acetaminophen, acetaminophen, alteplase, alum & mag hydroxide-simeth, diphenhydrAMINE, guaiFENesin-dextromethorphan, heparin, heparin, iohexol, lidocaine (PF), lidocaine-prilocaine, ondansetron (ZOFRAN) IV,  oxyCODONE-acetaminophen, pentafluoroprop-tetrafluoroeth  Diet Orders (From admission, onward)     Start     Ordered   10/11/21 1327  Diet renal with fluid restriction Fluid restriction: 1200 mL Fluid; Room service appropriate? Yes; Fluid consistency: Thin  Diet effective now       Question Answer Comment  Fluid restriction: 1200 mL Fluid   Room service appropriate? Yes   Fluid consistency: Thin      10/11/21 1326            DVT prophylaxis: heparin injection 5,000 Units Start: 10/09/21 1400 SCD's Start: 09/17/21 1002   Lab Results  Component Value Date   PLT 318 10/12/2021      Code Status: Full Code  Family Communication: s/o at bedside  Status is: Inpatient  Remains inpatient appropriate because: severity of illness   Level of care: Telemetry Medical  Consultants:  Neurology  Procedures:  2D echo  Microbiology  none  Antimicrobials: none    Objective: Vitals:   10/11/21 2050 10/12/21 0021 10/12/21 0407 10/12/21 0718  BP: 134/82 137/83 123/72 (!) 114/44  Pulse: 95 91 90 86  Resp:    18  Temp: 97.9 F (36.6 C)   99 F (37.2 C)  TempSrc: Oral     SpO2: 100% 100% 100% 100%  Weight:      Height:        Intake/Output Summary (Last 24 hours) at 10/12/2021 1007 Last data filed at 10/11/2021 1522 Gross per 24 hour  Intake 913.01 ml  Output 100 ml  Net 813.01 ml    Wt Readings from Last 3 Encounters:  10/10/21 59.2 kg  09/04/21 60.8 kg  08/25/21 61 kg    Examination:  Constitutional: NAD Eyes: lids and conjunctivae normal, no scleral icterus ENMT: mmm Neck: normal, supple Respiratory: clear to auscultation bilaterally, no wheezing, no crackles. Normal respiratory effort.  Cardiovascular: Regular rate and rhythm, no murmurs / rubs / gallops. No LE edema. Abdomen: soft, no distention, no tenderness. Bowel sounds positive.  Skin: no rashes Neurologic: no focal deficits, equal strength  Data Reviewed: I have independently reviewed following  labs and imaging studies   CBC Recent Labs  Lab 10/07/21 1152 10/09/21 1204 10/10/21 1418 10/11/21 0950 10/12/21 0805  WBC 9.7 9.5 8.8  --  11.9*  HGB 8.7* 8.6* 8.6* 12.2 10.2*  HCT 30.0* 29.1* 29.4* 36.0 35.6*  PLT 298 287 310  --  318  MCV 98.7 99.0 99.3  --  100.6*  MCH 28.6 29.3 29.1  --  28.8  MCHC 29.0* 29.6* 29.3*  --  28.7*  RDW 25.1* 23.9* 23.6*  --  22.5*  LYMPHSABS  --   --   --   --  PENDING  MONOABS  --   --   --   --  PENDING  EOSABS  --   --   --   --  PENDING  BASOSABS  --   --   --   --  PENDING     Recent Labs  Lab 10/07/21 1152 10/09/21 1204 10/10/21 1418 10/11/21 0950 10/12/21 0805  NA 131* 133*  128* 132* 135  K 5.7* 5.1 5.5* 4.8 4.8  CL 92* 96* 92* 98 96*  CO2 27 27 24   --  27  GLUCOSE 188* 223* 184* 69* 77  BUN 84* 84* 110* 47* 56*  CREATININE 7.81* 6.52* 7.61* 5.20* 5.79*  CALCIUM 10.2 10.5* 10.7*  --  11.2*  AST  --  20  --   --   --   ALT  --  15  --   --   --   ALKPHOS  --  96  --   --   --   BILITOT  --  0.3  --   --   --   ALBUMIN 2.1* 2.1* 2.1*  --  2.9*     ------------------------------------------------------------------------------------------------------------------ No results for input(s): CHOL, HDL, LDLCALC, TRIG, CHOLHDL, LDLDIRECT in the last 72 hours.  Lab Results  Component Value Date   HGBA1C 10.4 (H) 08/11/2021   ------------------------------------------------------------------------------------------------------------------ No results for input(s): TSH, T4TOTAL, T3FREE, THYROIDAB in the last 72 hours.  Invalid input(s): FREET3  Cardiac Enzymes No results for input(s): CKMB, TROPONINI, MYOGLOBIN in the last 168 hours.  Invalid input(s): CK ------------------------------------------------------------------------------------------------------------------ No results found for: BNP  CBG: Recent Labs  Lab 10/11/21 1300 10/11/21 2057 10/12/21 0019 10/12/21 0405 10/12/21 0618  GLUCAP 90 174* 136* 76 95      No results found for this or any previous visit (from the past 240 hour(s)).   Radiology Studies: No results found.   Marzetta Board, MD, PhD [facility]  Between 7 am - 7 pm I am available, please contact me via Amion (for emergencies) or Securechat (non urgent messages)  Between 7 pm - 7 am I am not available, please contact night coverage MD/APP via Amion

## 2021-10-12 NOTE — Progress Notes (Signed)
Mobility Specialist Progress Note  ? ? 10/12/21 1147  ?Mobility  ?Activity Contraindicated/medical hold  ? ?Upon arrival pt sleeping and initially responded then went back to sleep. Will f/u as schedule permits.  ? ?[name] ?Mobility Specialist  ?M.S. 5N: [phone]  ?

## 2021-10-12 NOTE — Progress Notes (Addendum)
?  Progress Note ? ? ? ?10/12/2021 ?7:21 AM ?1 Day Post-Op ? ?Subjective:  sleepy but says her leg hurts when asked ? ?Tm 99 ?Vitals:  ? 10/12/21 0407 10/12/21 9833  ?BP: 123/72 (!) 114/44  ?Pulse: 90 86  ?Resp:  18  ?Temp:  99 ?F (37.2 ?C)  ?SpO2: 100% 100%  ? ? ?Physical Exam: ?Incisions:  bandage in place and is clean and dry ? ? ?CBC ?   ?Component Value Date/Time  ? WBC 8.8 10/10/2021 1418  ? RBC 2.96 (L) 10/10/2021 1418  ? HGB 12.2 10/11/2021 0950  ? HGB 10.6 (L) 05/18/2020 8250  ? HCT 36.0 10/11/2021 0950  ? HCT 35.1 05/18/2020 0927  ? PLT 310 10/10/2021 1418  ? PLT 186 05/18/2020 0927  ? MCV 99.3 10/10/2021 1418  ? MCV 89 05/18/2020 0927  ? MCH 29.1 10/10/2021 1418  ? MCHC 29.3 (L) 10/10/2021 1418  ? RDW 23.6 (H) 10/10/2021 1418  ? RDW 13.9 05/18/2020 0927  ? LYMPHSABS 1.9 09/27/2021 0129  ? LYMPHSABS 1.6 05/18/2020 0927  ? MONOABS 1.3 (H) 09/27/2021 0129  ? EOSABS 0.4 09/27/2021 0129  ? EOSABS 0.6 (H) 05/18/2020 5397  ? BASOSABS 0.1 09/27/2021 0129  ? BASOSABS 0.0 05/18/2020 6734  ? ? ?BMET ?   ?Component Value Date/Time  ? NA 132 (L) 10/11/2021 0950  ? NA 133 (L) 05/18/2020 1937  ? K 4.8 10/11/2021 0950  ? CL 98 10/11/2021 0950  ? CO2 24 10/10/2021 1418  ? GLUCOSE 69 (L) 10/11/2021 0950  ? BUN 47 (H) 10/11/2021 0950  ? BUN 45 (H) 05/18/2020 9024  ? CREATININE 5.20 (H) 10/11/2021 0950  ? CALCIUM 10.7 (H) 10/10/2021 1418  ? GFRNONAA 6 (L) 10/10/2021 1418  ? GFRAA 6 (L) 05/18/2020 0973  ? ? ?INR ?   ?Component Value Date/Time  ? INR 1.2 08/10/2021 2212  ? ? ? ?Intake/Output Summary (Last 24 hours) at 10/12/2021 0721 ?Last data filed at 10/11/2021 1522 ?Gross per 24 hour  ?Intake 913.01 ml  ?Output 100 ml  ?Net 813.01 ml  ? ? ? ?Assessment/Plan:  41 y.o. female is s/p right above knee amputation  1 Day Post-Op ? ?-bandage is clean and dry-will take this down tomorrow ?-pt does not have pain medication ordered.  Will order percocet 5/325 q8h prn pain.  Please do not give if pt is somnolent ?-will need to f/u with [facility]  in 4 weeks for staple removal. Our office will arrange appt.  ? ? ?[name], PA-C ?[facility] ?[phone] ?10/12/2021 ?7:21 AM ? ? ?Plan for dressing change tomorrow ? ?[name] ? ? ? ? ?  ? ?

## 2021-10-12 NOTE — Progress Notes (Signed)
[facility] notified that patient is screaming about her pain family member upset that only tylenol 650 po ordered. New orders were received will continue to monitor, Arthor Captain LPN ?

## 2021-10-12 NOTE — Progress Notes (Signed)
Neurology Progress Note ? ?Brief HPI:  ?[name] is a 41 y.o. female with a medical history significant for ESRD on HD T/Th/Sat, type 2 diabetes mellitus with neuropathy, hypertension, right eye blindness, and medical non-adherence who presented to the ED due to lethargy and AMS following HD on 2/3.  ? ?Subjective: ?Patient underwent R AKA yesterday given lack of healing of R BKA. On interview this morning patient appears lethargic and sedated with dysarthric speech. Her partner is at the bedside and they are both updated on the treatment plan as well as prognosis of her condition, with the expectation that recovery will likely only be discernible after several weeks.  ? ?Exam: ?Vitals:  ? 10/12/21 0407 10/12/21 0718  ?BP: 123/72 (!) 114/44  ?Pulse: 90 86  ?Resp:  18  ?Temp:  99 ?F (37.2 ?C)  ?SpO2: 100% 100%  ? ?Gen: In bed, NAD ?Resp: non-labored breathing, no acute distress ?Abd: soft, nt ? ?Neuro: ?Mental status: lethargic, able to follow commands ?CN:  ?Profound ptosis: unable to open eyelids  ?Pupils are non-reactive  ?Can discern the number of fingers in front of her, but cannot discern color. ?Severe impairment in extraocular muscles, minimal adduction and abduction of eyes bilaterally ?Intact tongue protrusion ?No palate rise ?Motor/DTR: ?5/5 strength ?2+ brachioradialis bilaterally, absent biceps and triceps reflex ?Sensation: facial sensation intact bilaterally ?Gait: Deferred ? ? ? ?Assessment: 41 y.o. female with history of ESRD on HD T/Th/Sat, type 2 diabetes mellitus with neuropathy, hypertension, osteomyelitis with BKA, right eye blindness, and medical non-adherence presenting with  lethargy and AMS following HD on 2/3.  Initial work up in the ED revealed patient with leukocytosis concerning for infection and imaging with concern for osteomyelitis for which vascular surgery recommended admission for BKA on 2/5 and ongoing antibiotics. Of note, patient was discharged in January following urgent  transmetatarsal amputation after being found with gangrenous toes s/p wound vac and IV Ancef after wound cultures grew MSSA. During hospitalization, patient's mental status initially improved and she was responding appropriately on evaluation on 2/4 with surgery for BKA on 2/5. On 2/6, she was noted to be somnolent and slow to respond but she was answering questions appropriately. On 2/7 patient had an episode of hypotension with HD but was noted to still be lethargic but followed commands and answered questions appropriately. Patient again had HD on 2/9 and became more lethargic, patient lethargic on exam during HD on 2/11. TEE completed- no thrombus detected, small PFO noted.  No longer encephalopathic. Inpatient stroke work up completed.    ?  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 She states that this has been ongoing for about the past month. She endorses total loss of vision in her right eye that is and severe loss of vision in her left eye which she estimates has been present for about 1 month as well. even follows all simple commands and answer question appropriately. Her sister reports however that this only started about a week or so ago while she was inhospital. ?Exam confirms severe bilateral ptosis that localizes to the 3rd cranial nerve. Bilateral pupils oblong and 3 mm, non reactive. She has opthalmoplegia, dysconjugate gaze with exotropic with right eye deviated laterally. ?Cavernous sinus imaging was negative. ?DDx: Eye opening apraxia is felt to be unlikely. Cranial polyneuropathy including CN III IV VI is possible. Miller-Fisher variant of  GBS is felt to be higher on the DDx given negative MRI brain and orbits (see below). Atypical predominantly bulbar presentation of AIDP is also possible; her BLE weakness and hyporeflexia further  support this possibility. Critical illness neuropathy/myopathy would not be expected to present disproportionately with eye involvement but this is also possible. Bulbar myopathy or idiopathic cranial polyneuropathy also on the DDx. Botulism and tick paralysis are unlikely as she has not had any dyspnea; however, the pupil in her left eye has no discernible reactivity, which would be expected for botulism (right eye with prior retinal detachment) and she also has swallowing difficulty requiring pureed diet. An atypical presentation of myasthenia gravis is also possible. NMO is on the DDx given BLE weakness and vision loss.  ?MRI brain  with and without contrast performed yesterday (2/25): Repeat MRI brain with and without contrast (2/25): There is expected evolution of the  infarcts seen on the prior study. No acute infarct. Stable chronic infarcts and chronic microvascular ischemic changes. Scattered chronic microhemorrhages are again noted. Ventricles are stable in size. No intracranial mass or abnormal enhancement to explain the patient's bilateral ptosis and ophthalmoplegia. Cavernous sinus enhancement is symmetric and unremarkable. Persistent left greater than right mastoid fluid opacification. Sella is unremarkable. ?MRI orbits with and without contrast performed yesterday (2/25): Bilateral lens replacements. Chronic right retinal detachment. Extraocular muscles are symmetric and unremarkable. No abnormal enhancement of the optic nerve sheath complexes.  ?6. Impression: Given no cavernous sinus pathology identified on MRI to explain the patient's severe bilateral ptosis and ophthalmoparesis, rare potential etiologies will now need to be considered (in addition to above DDx). 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 Her ophthalmoparesis and possible cognitive impairment on exam (she is poorly cooperative, making determination of cognitive  impairment difficult) would make thiamine deficiency more likely. Unfortunately, ataxia cannot be tested as she is noncompliant with this and several other components of the exam. Will need thiamine level followed by empiric thiamine. We were initially planning an LP but given her severe peripheral vascular disease requiring below knee amputation and the fact that she had to be taken to OR again for above knee amputation, we do not want to hold her dual antiplatelet for the LP. ?Other work up for her ptosis and ophthalmoparesis is beyond the scope of this inpatient admission and she will need to be evaluated outpatient by a Neurophthalmologist (Dr. Michel Bickers of [facility] is recommended: [phone]). ?  ?Recommendations: ?- Continue PLEX with FFP replacement. 1 of 5 completed and she tolerated it well. ?- Will need outpatient EMG/NCS after discharge. Will need outpatient Neurology follow up with [facility] for this. ?- Consider 30 day cardiac event monitoring as outpatient for the strokes. ?- Follow up with Neuro-opthalmologist (Dr. Michel Bickers of [facility] is recommended: [phone]). ? ?Corky Sox, MD ?PGY-1 ? ?NEUROHOSPITALIST ADDENDUM ?Performed a face to face diagnostic evaluation.  ? ?I have reviewed the contents of history and physical exam as documented by PA/ARNP/Resident and agree with above documentation.  ?I have discussed and formulated the above plan as documented. Edits to the note have been made as needed. ? ?Impression/Key exam findings/Plan: We were considering LP but given severe PVD and s/p below knee amputation recently and above knee amputation on 3/2, want to avoid holding DAPT at this time. We will continue treating her with PLEX for presumptive [name] of GBS. She tolerated the first session without any  issues. ? ?Donnetta Simpers, MD ?[facility] ?[phone] ?  ?If 7pm to 7am, please call  on call as listed on AMION. ? ?

## 2021-10-12 NOTE — Progress Notes (Addendum)
[name] Progress Note   Subjective:    Seen and examined patient at bedside. No complaints. S/p R AKA by Dr. Trula Slade. Also s/p temp HD cath placement for PLEX-1st session completed 3/1. Plan for HD later this afternoon.   Objective Vitals:   10/11/21 2050 10/12/21 0021 10/12/21 0407 10/12/21 0718  BP: 134/82 137/83 123/72 (!) 114/44  Pulse: 95 91 90 86  Resp:    18  Temp: 97.9 F (36.6 C)   99 F (37.2 C)  TempSrc: Oral     SpO2: 100% 100% 100% 100%  Weight:      Height:       Physical Exam General:Chronically ill appearing female in NAD HEENT: Core track tube L nare Heart:RRR, no mrg Lungs:CTAB, nml WOB Abdomen:soft, NTND Extremities:no LE edema, now R AKA-ACE wrap present-dressing CDI Dialysis Access: RU AVF +b/t; R IJ temp HD cath (placed today by IR)  Filed Weights   10/07/21 1641 10/09/21 0515 10/10/21 0500  Weight: 60.5 kg 61 kg 59.2 kg    Intake/Output Summary (Last 24 hours) at 10/12/2021 0919 Last data filed at 10/11/2021 1522 Gross per 24 hour  Intake 913.01 ml  Output 100 ml  Net 813.01 ml    Additional Objective Labs: Basic Metabolic Panel: Recent Labs  Lab 10/07/21 1152 10/09/21 1204 10/10/21 1418 10/11/21 0950  NA 131* 133* 128* 132*  K 5.7* 5.1 5.5* 4.8  CL 92* 96* 92* 98  CO2 '27 27 24  ' --   GLUCOSE 188* 223* 184* 69*  BUN 84* 84* 110* 47*  CREATININE 7.81* 6.52* 7.61* 5.20*  CALCIUM 10.2 10.5* 10.7*  --   PHOS 5.0*  --  3.9  --    Liver Function Tests: Recent Labs  Lab 10/07/21 1152 10/09/21 1204 10/10/21 1418  AST  --  20  --   ALT  --  15  --   ALKPHOS  --  96  --   BILITOT  --  0.3  --   PROT  --  7.3  --   ALBUMIN 2.1* 2.1* 2.1*   No results for input(s): LIPASE, AMYLASE in the last 168 hours. CBC: Recent Labs  Lab 10/07/21 1152 10/09/21 1204 10/10/21 1418 10/11/21 0950  WBC 9.7 9.5 8.8  --   HGB 8.7* 8.6* 8.6* 12.2  HCT 30.0* 29.1* 29.4* 36.0  MCV 98.7 99.0 99.3  --   PLT 298 287 310  --     Blood Culture    Component Value Date/Time   SDES BLOOD LEFT HAND 09/22/2021 1554   SPECREQUEST  09/22/2021 1554    BOTTLES DRAWN AEROBIC AND ANAEROBIC Blood Culture results may not be optimal due to an inadequate volume of blood received in culture bottles   CULT  09/22/2021 1554    NO GROWTH 5 DAYS Performed at [facility], [place] [address] [place], [place] [address]    REPTSTATUS 09/27/2021 FINAL 09/22/2021 1554    Cardiac Enzymes: No results for input(s): CKTOTAL, CKMB, CKMBINDEX, TROPONINI in the last 168 hours. CBG: Recent Labs  Lab 10/11/21 1300 10/11/21 2057 10/12/21 0019 10/12/21 0405 10/12/21 0618  GLUCAP 90 174* 136* 76 95   Iron Studies: No results for input(s): IRON, TIBC, TRANSFERRIN, FERRITIN in the last 72 hours. Lab Results  Component Value Date   INR 1.2 08/10/2021   INR 1.1 06/30/2020   Studies/Results: IR Fluoro Guide CV Line Right  Result Date: 10/11/2021 INDICATION: 41 year old female with history of Guillain-Barre syndrome requiring  central venous access for PLEX. EXAM: NON-TUNNELED CENTRAL VENOUS HEMODIALYSIS CATHETER PLACEMENT WITH ULTRASOUND AND FLUOROSCOPIC GUIDANCE COMPARISON:  None. MEDICATIONS: None FLUOROSCOPY TIME:  0 minutes, 6 seconds (2 mGy) COMPLICATIONS: None immediate. PROCEDURE: Informed written consent was obtained from the patient after a discussion of the risks, benefits, and alternatives to treatment. Questions regarding the procedure were encouraged and answered. The right neck and chest were prepped with chlorhexidine in a sterile fashion, and a sterile drape was applied covering the operative field. Maximum barrier sterile technique with sterile gowns and gloves were used for the procedure. A timeout was performed prior to the initiation of the procedure. Preprocedure ultrasound evaluation demonstrated occlusion of the right internal jugular vein. The right external jugular vein was patent. After the overlying soft tissues were  anesthetized, a small venotomy incision was created and a micropuncture kit was utilized to access the right external jugular vein. Real-time ultrasound guidance was utilized for vascular access including the acquisition of a permanent ultrasound image documenting patency of the accessed vessel. The microwire was utilized to measure appropriate catheter length. A Rosen wire was advanced to the level of the IVC. Under fluoroscopic guidance, the venotomy was serially dilated, ultimately allowing placement of a 20 cm temporary Trialysis catheter with tip ultimately terminating within the superior aspect of the right atrium. Final catheter positioning was confirmed and documented with a spot radiographic image. The catheter aspirates and flushes normally. The catheter was flushed with appropriate volume heparin dwells. The catheter exit site was secured with a 0 silk retention suture. A dressing was placed. The patient tolerated the procedure well without immediate post procedural complication. IMPRESSION: 1. Successful placement of a right external jugular vein approach 20 cm temporary dialysis catheter with tip terminating with in the superior aspect of the right atrium. The catheter is ready for immediate use. 2. 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 Tracey Cancer, MD [name] Electronically Signed   By: [name] M.D.   On: 10/11/2021 08:52   IR US Guide Vasc Access Right  Result Date: 10/11/2021 INDICATION: 41 year old female with history of Guillain-Barre syndrome requiring central venous access for PLEX. EXAM: NON-TUNNELED CENTRAL VENOUS HEMODIALYSIS CATHETER PLACEMENT WITH ULTRASOUND AND FLUOROSCOPIC GUIDANCE COMPARISON:  None. MEDICATIONS: None FLUOROSCOPY TIME:  0 minutes, 6 seconds (2 mGy) COMPLICATIONS: None immediate. PROCEDURE: Informed written consent was obtained from the patient after a discussion of the risks, benefits,  and alternatives to treatment. Questions regarding the procedure were encouraged and answered. The right neck and chest were prepped with chlorhexidine in a sterile fashion, and a sterile drape was applied covering the operative field. Maximum barrier sterile technique with sterile gowns and gloves were used for the procedure. A timeout was performed prior to the initiation of the procedure. Preprocedure ultrasound evaluation demonstrated occlusion of the right internal jugular vein. The right external jugular vein was patent. After the overlying soft tissues were anesthetized, a small venotomy incision was created and a micropuncture kit was utilized to access the right external jugular vein. Real-time ultrasound guidance was utilized for vascular access including the acquisition of a permanent ultrasound image documenting patency of the accessed vessel. The microwire was utilized to measure appropriate catheter length. A Rosen wire was advanced to the level of the IVC. Under fluoroscopic guidance, the venotomy was serially dilated, ultimately allowing placement of a 20 cm temporary Trialysis catheter with tip ultimately terminating within the superior aspect of the right atrium. Final catheter positioning  was confirmed and documented with a spot radiographic image. The catheter aspirates and flushes normally. The catheter was flushed with appropriate volume heparin dwells. The catheter exit site was secured with a 0 silk retention suture. A dressing was placed. The patient tolerated the procedure well without immediate post procedural complication. IMPRESSION: 1. Successful placement of a right external jugular vein approach 20 cm temporary dialysis catheter with tip terminating with in the superior aspect of the right atrium. The catheter is ready for immediate use. 2. 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 Tracey Cancer, MD [facility]  Electronically Signed   By: [name] M.D.   On: 10/11/2021 08:52    Medications:  calcium gluconate     citrate dextrose     dextrose 5% lactated ringers Stopped (10/11/21 0918)   lactated ringers     thiamine injection 250 mg (10/11/21 2145)    aspirin EC  81 mg Oral Daily   atorvastatin  40 mg Oral Daily   Chlorhexidine Gluconate Cloth  6 each Topical Q0600   cinacalcet  90 mg Oral Q supper   clopidogrel  75 mg Oral Daily   darbepoetin (ARANESP) injection - DIALYSIS  200 mcg Intravenous Q Thu-HD   dorzolamide-timolol  1 drop Left Eye BID   heparin injection (subcutaneous)  5,000 Units Subcutaneous Q8H   heparin sodium (porcine)  1,000 Units Intracatheter Once   insulin aspart  0-6 Units Subcutaneous TID WC   insulin glargine-yfgn  14 Units Subcutaneous QHS   lidocaine-prilocaine  1 application Topical UD   mouth rinse  15 mL Mouth Rinse BID   multivitamin  1 tablet Oral QHS   nutrition supplement (JUVEN)  1 packet Oral BID BM   pantoprazole sodium  40 mg Oral Daily   sevelamer carbonate  1.6 g Oral TID WC   [START ON 10/15/2021] thiamine  100 mg Oral Daily    Dialysis Orders: [facility] 3h 22mn   400/ 600   59.5kg   2/2 bath  Prof 2   RU AV fistula  Hep 4000  Mircera 200 mcg IV every 2 weeks - last 1/26 Ancef 2g 3 times weekly HD Covid neg 2/3, hep B SAg neg 2/4  Assessment/Plan: 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  Repeat MRI with no acute findings.  With PFO on TTE. TEE showed small PFO and no clot. Blood Cx 2/4 negative. S/p course of IV abx.  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 MRI of orbits/sinuses completed this AM, with no abnormalities of orbit/sinuses noted. Neuro following: plan for LP after 5 days off of ASA and Plavix. S/p R IJ temp HD cath 3/1 by IR Dr. SKarlene Einsteinneuro, receiving PLEX-1st session completed 3/1. 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 S/p R AKA 10/11/21 by Dr. BTrula Slade To go to CIR at some point.  ESRD - Usually  TTS schedule - Tolerated yesterday's HD with net UF 1.2L-tolerated being in the chair. Plan for HD later this afternoon. Trying to keep SBP > 100 on HD due to #1. Wants to continue HD at this time.  Continue to monitor behavior during HD and tolerate dialysis in chair prior to d/c. Sepsis/ shock - resolved  Anemia of CKD- last Hgb stable at 8.6, continue Aranesp 2058m q Thurs. Transfuse pRBC prn.  MBD: CorrCa high, Phos ok. Continue home sensipar/binders, hold VDRA for now.   HTN/volume - midodrine with HD.  UF as tolerated w/ albumin support prn. Below prior EDW, will need to be lowered on d/c. Severe protein calorie malnutrition: NG tube in place with Nepro TF's + supplements. DMT2 - per PMD  Dispo - likely will need SNF placement.  Tobie Poet, NP [facility] 10/12/2021,9:19 AM  LOS: 27 days

## 2021-10-12 NOTE — Progress Notes (Signed)
Physical Therapy Treatment ?Patient Details ?Name: [name] ?MRN: [id number] ?DOB: 05/28/1981 ?Today's Date: 10/12/2021 ? ? ?History of Present Illness Pt is a 41 y.o. female admitted 09/15/21 post-HD session with concern for AMS when pt stopped speaking to staff. 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 Pt with increased somnolence 2/6; noted to be more lethargic in HD 2/7, 2/9, 2/11. Head CT 2/10 negative for acute injury; chronic small vessel disease and infarcts. MRI 2/10 with scattered punctate acute infarcts bilateral cerebral hemispheres (may be embolic), potential subacute infarct L corona radiata. Pt with persistent bilateral ptosis (pt reports ongoing ~1 month). MRI orbits 2/25 with evolution of infarcts seen on prior study; no acute infarct; chronic R retinal detachment. Per neurology 2/28, high suspicion for miller fischer GBS; to proceed with PLEX treatment. S/p R EJ temporary HD cath placement 3/1. 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 PMH includes ESRD (HD TTS), DM2, HTN, neuropathy, R eye blindness (2008). ?  ?PT Comments  ? ? Pt adamantly declining seated or OOB activity this session despite max encouragement and education; performing bed mobility with min guard to modA. Pt c/o significant R residual limb pain; demonstrates poor tolerance to R hip flexor stretch via bed features. Pt remains limited by generalized weakness, decreased activity tolerance, poor balance strategies/postural reactions and impaired cognition. Awaiting clarification from neuro MD if pt able to mobilize while receiving PLEX tx. Will continue to follow acutely to address established goals. ?   ?Recommendations for follow up therapy are one component of a multi-disciplinary discharge planning process, led by the attending physician.  Recommendations may be updated based on patient status, additional functional criteria and insurance authorization. ? ?Follow Up Recommendations ? Acute inpatient rehab  (3hours/day) ?  ?  ?Assistance Recommended at Discharge Frequent or constant Supervision/Assistance  ?Patient can return home with the following Two people to help with walking and/or transfers;Assistance with cooking/housework;Assistance with feeding;Direct supervision/assist for medications management;Direct supervision/assist for financial management;Assist for transportation;Help with stairs or ramp for entrance;A lot of help with bathing/dressing/bathroom ?  ?Equipment Recommendations ? BSC/3in1;Wheelchair (measurements PT);Wheelchair cushion (measurements PT);Hospital bed;Other (comment) (lift equipment)  ?  ?Recommendations for Other Services   ? ? ?  ?Precautions / Restrictions Precautions ?Precautions: Fall;Other (comment) ?Precaution Comments: R BKA now R AKA; bilateral ptosis with blindness; bowel incontinence; PLEX treatment (awaiting neuro MD verification if pt able to mobilize while this IV tx is running) ?Required Braces or Orthoses: Other Brace ?Other Brace: RLE limb guard ?Restrictions ?Weight Bearing Restrictions: Yes ?RUE Weight Bearing: Weight bearing as tolerated ?RLE Weight Bearing: Non weight bearing  ?  ? ?Mobility ? Bed Mobility ?Overal bed mobility: Needs Assistance ?Bed Mobility: Rolling ?  ?  ?  ?  ?  ?General bed mobility comments: pt initially requiring modA for trunk rotation rolling to R side, reaching around to find bed rails but then letting go; able to perform additional trial with minA for LLE management ?  ? ?Transfers ?  ?  ?  ?  ?  ?  ?  ?  ?  ?General transfer comment: pt declined ?  ? ?Ambulation/Gait ?  ?  ?  ?  ?  ?  ?  ?  ? ? ?Stairs ?  ?  ?  ?  ?  ? ? ?Wheelchair Mobility ?  ? ?Modified Rankin (Stroke Patients Only) ?  ? ? ?  ?Balance   ?  ?  ?  ?  ?  ?  ?  ?  ?  ?  ?  ?  ?  ?  ?  ?  ?  ?  ?  ? ?  ?  Cognition Arousal/Alertness: Lethargic, Suspect due to medications, Awake/alert (pt reports sleepy from pain meds) ?Behavior During Therapy: Flat affect ?Overall Cognitive  Status: Difficult to assess ?Area of Impairment: Attention, Memory, Following commands, Safety/judgement, Awareness, Problem solving ?  ?  ?  ?  ?  ?  ?  ?  ?  ?Current Attention Level: Sustained ?Memory: Decreased recall of precautions, Decreased short-term memory ?Following Commands: Follows one step commands with increased time ?Safety/Judgement: Decreased awareness of safety, Decreased awareness of deficits ?Awareness: Emergent ?Problem Solving: Slow processing, Difficulty sequencing, Requires verbal cues, Requires tactile cues ?General Comments: difficult to encourage mobility progression this session - pt reports, "I'm not doing it" related to pain and fatigue, also repeating, "I can't, they took my foot" - pt inconsistent with command following and physical capability of performing a movement; endorses fatigue from pain meds ?  ?  ? ?  ?Exercises   ? ?  ?General Comments General comments (skin integrity, edema, etc.): increased time educ on importance of mobility progression and OOB activity, pt still declining beyond bed-level mobility; reports agreeable to appt at ~9A tomorrow for out of bed activity ?  ?  ? ?Pertinent Vitals/Pain Pain Assessment ?Pain Assessment: Faces ?Faces Pain Scale: Hurts even more ?Pain Location: RLE ?Pain Descriptors / Indicators: Grimacing, Guarding, Crying ?Pain Intervention(s): Limited activity within patient's tolerance, Premedicated before session, Repositioned  ? ? ?Home Living   ?  ?  ?  ?  ?  ?  ?  ?  ?  ?   ?  ?Prior Function    ?  ?  ?   ? ?PT Goals (current goals can now be found in the care plan section) Progress towards PT goals: Not progressing toward goals - comment (reports limited by pain/fatigue this session) ? ?  ?Frequency ? ? ? Min 4X/week ? ? ? ?  ?PT Plan Current plan remains appropriate  ? ? ?Co-evaluation   ?Reason for Co-Treatment: Complexity of the patient's impairments (multi-system involvement) ?  ?OT goals addressed during session: ADL's and self-care ?   ? ?  ?AM-PAC PT "6 Clicks" Mobility   ?Outcome Measure ? Help needed turning from your back to your side while in a flat bed without using bedrails?: A Little ?Help needed moving from lying on your back to sitting on the side of a flat bed without using bedrails?: A Lot ?Help needed moving to and from a bed to a chair (including a wheelchair)?: Total ?Help needed standing up from a chair using your arms (e.g., wheelchair or bedside chair)?: Total ?Help needed to walk in hospital room?: Total ?Help needed climbing 3-5 steps with a railing? : Total ?6 Click Score: 9 ? ?  ?End of Session   ?Activity Tolerance: Patient limited by fatigue;Patient limited by pain ?Patient left: in bed;with call bell/phone within reach;with bed alarm set ?Nurse Communication: Mobility status ?PT Visit Diagnosis: Other abnormalities of gait and mobility (R26.89);Muscle weakness (generalized) (M62.81);Difficulty in walking, not elsewhere classified (R26.2);Other symptoms and signs involving the nervous system (R29.898) ?Pain - Right/Left: Right ?Pain - part of body: Leg ?  ? ? ?Time: 9628-3662 ?PT Time Calculation (min) (ACUTE ONLY): 16 min ? ?Charges:             ?          ? ?Mabeline Caras, PT, DPT ?Acute Rehabilitation Services  ?Pager [phone] ?Office [phone] ? ?Derry Lory ?10/12/2021, 2:13 PM ? ?

## 2021-10-13 DIAGNOSIS — G610 Guillain-Barre syndrome: Secondary | ICD-10-CM | POA: Diagnosis not present

## 2021-10-13 LAB — GLUCOSE, CAPILLARY
Glucose-Capillary: 152 mg/dL — ABNORMAL HIGH (ref 70–99)
Glucose-Capillary: 122 mg/dL — ABNORMAL HIGH (ref 70–99)
Glucose-Capillary: 48 mg/dL — ABNORMAL LOW (ref 70–99)
Glucose-Capillary: 120 mg/dL — ABNORMAL HIGH (ref 70–99)
Glucose-Capillary: 76 mg/dL (ref 70–99)
Glucose-Capillary: 102 mg/dL — ABNORMAL HIGH (ref 70–99)
Glucose-Capillary: 78 mg/dL (ref 70–99)
Glucose-Capillary: 72 mg/dL (ref 70–99)

## 2021-10-13 LAB — VITAMIN B1: Vitamin B1 (Thiamine): 724.9 nmol/L — ABNORMAL HIGH (ref 66.5–200.0)

## 2021-10-13 MED ORDER — DIPHENHYDRAMINE HCL 25 MG PO CAPS: 25.0000 mg | ORAL_CAPSULE | Freq: Four times a day (QID) | ORAL | Status: DC | PRN

## 2021-10-13 MED ORDER — ANTICOAGULANT SODIUM CITRATE 4% (200MG/5ML) IV SOLN
5.0000 mL | Status: AC
  Administered 2021-10-13: 5 mL
  Filled 2021-10-13: qty 5

## 2021-10-13 MED ORDER — INSULIN GLARGINE-YFGN 100 UNIT/ML ~~LOC~~ SOLN
10.0000 [IU] | Freq: Every day | SUBCUTANEOUS | Status: DC
  Filled 2021-10-13 (×2): qty 0.1

## 2021-10-13 MED ORDER — DEXTROSE 50 % IV SOLN
INTRAVENOUS | Status: AC
  Administered 2021-10-13: 25 mL
  Filled 2021-10-13: qty 50

## 2021-10-13 MED ORDER — ACD FORMULA A 0.73-2.45-2.2 GM/100ML VI SOLN: 1000.0000 mL | Status: DC

## 2021-10-13 MED ORDER — CALCIUM GLUCONATE-NACL 2-0.675 GM/100ML-% IV SOLN
2.0000 g | Freq: Once | INTRAVENOUS | Status: AC
  Administered 2021-10-13: 2000 mg via INTRAVENOUS

## 2021-10-13 MED ORDER — ACETAMINOPHEN 325 MG PO TABS: 650.0000 mg | ORAL_TABLET | ORAL | Status: DC | PRN

## 2021-10-13 NOTE — Progress Notes (Signed)
Subjective: Awoken from sleep no complaints said tolerated dialysis yesterday, no pain in right AKA ? ?Objective ?Vital signs in last 24 hours: ?Vitals:  ? 10/12/21 2007 10/12/21 2336 10/13/21 0309 10/13/21 9381  ?BP: 117/69 128/70 132/74 (!) 97/52  ?Pulse: 95 94 94 97  ?Resp: 18 18 18 17   ?Temp: 99.7 ?F (37.6 ?C) 97.8 ?F (36.6 ?C) 98 ?F (36.7 ?C) 98.5 ?F (36.9 ?C)  ?TempSrc: Oral Axillary Oral Oral  ?SpO2: 100% 100% 100% 98%  ?Weight:   62 kg   ?Height:      ? ?Weight change:  ? ?Physical Exam: ?General: Chronically ill-appearing young female NAD, this a.m. oriented to place, Friday HD schedule ?Heart: RRR no appreciated MRG ?Lungs: CTA nonlabored breathing ?Abdomen: NABS, S, NT ND ?Extremities: No lower extremity edema/right AKA dressing dry clear  ?dialysis Access: RUA AVF positive bruit, right IJ temp HD cath ? ?Dialysis Orders: ?[facility] ?3h 39min   400/ 600   59.5kg   2/2 bath  Prof 2   RU AV fistula  Hep 4000  ?Mircera 200 mcg IV every 2 weeks - last 1/26 ?Ancef 2g 3 times weekly HD ?Covid neg 2/3, hep B SAg neg 2/4 ? ?Problem/Plan: ?Embolic stroke with AMS -neuro following, acute embolic CVA / Repeat MRI with no acute findings.  With PFO on TTE. TEE showed small PFO and no clot. Blood Cx 2/4 negative. S/p course of IV abx.  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 plan for LP after 5 days off of ASA and Plavix. S/p R IJ temp HD cath 3/1 by IR Dr. Karlene Einstein neuro, receiving PLEX-1st session completed 3/1. 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 Trula Slade [facility] following.  To go to CIR at some point.  ?ESRD - Usually TTS schedule - Tolerated yesterday's HD with net UF 1.-Trying to keep SBP > 100 on HD due to #1. Wants to continue HD at this time.  Continue to monitor behavior during HD and tolerate dialysis in chair prior to d/c. ?HTN/volume -some element of hypotension this morning  improved with fluid /midodrine with HD.  Keep even on HD tomorrow,Below prior EDW, will need to be lowered on d/c. ?Sepsis/ shock - resolved  ?Anemia of CKD- last Hgb stable 10.2  continue Aranesp 245mcg q Thurs. Transfuse pRBC prn.  ?MBD: CorrCa high, Phos ok. Continue home sensipar/binders, hold VDRA for now.   ?Severe protein calorie malnutrition: NG tube in place with Nepro TF's + supplements. ?DMT2 - per PMD  ?Dispo - likely will need SNF placement/versus CIR ? ?Ernest Haber, PA-C ?[facility] ?Beeper [phone] ?10/13/2021,9:01 AM ? LOS: 28 days  ? ?Labs: ?Basic Metabolic Panel: ?Recent Labs  ?Lab 10/07/21 ?1152 10/09/21 ?1204 10/10/21 ?1418 10/11/21 ?0950 10/12/21 ?0805  ?NA 131* 133* 128* 132* 135  ?K 5.7* 5.1 5.5* 4.8 4.8  ?CL 92* 96* 92* 98 96*  ?CO2 27 27 24   --  27  ?GLUCOSE 188* 223* 184* 69* 77  ?BUN 84* 84* 110* 47* 56*  ?CREATININE 7.81* 6.52* 7.61* 5.20* 5.79*  ?CALCIUM 10.2 10.5* 10.7*  --  11.2*  ?PHOS 5.0*  --  3.9  --  5.8*  ? ?Liver Function Tests: ?Recent Labs  ?Lab 10/09/21 ?1204 10/10/21 ?1418 10/12/21 ?0805  ?AST 20  --   --   ?ALT 15  --   --   ?  ALKPHOS 96  --   --   ?BILITOT 0.3  --   --   ?PROT 7.3  --   --   ?ALBUMIN 2.1* 2.1* 2.9*  ? ?No results for input(s): LIPASE, AMYLASE in the last 168 hours. ?No results for input(s): AMMONIA in the last 168 hours. ?CBC: ?Recent Labs  ?Lab 10/07/21 ?1152 10/09/21 ?1204 10/10/21 ?1418 10/11/21 ?0950 10/12/21 ?0805  ?WBC 9.7 9.5 8.8  --  11.9*  ?NEUTROABS  --   --   --   --  7.9*  ?HGB 8.7* 8.6* 8.6* 12.2 10.2*  ?HCT 30.0* 29.1* 29.4* 36.0 35.6*  ?MCV 98.7 99.0 99.3  --  100.6*  ?PLT 298 287 310  --  318  ? ?Cardiac Enzymes: ?No results for input(s): CKTOTAL, CKMB, CKMBINDEX, TROPONINI in the last 168 hours. ?CBG: ?Recent Labs  ?Lab 10/13/21 ?0104 10/13/21 ?0308 10/13/21 ?7471 10/13/21 ?5953 10/13/21 ?0846  ?GLUCAP 152* 122* 44* 48* 120*  ? ? ?Studies/Results: ?No results found. ?Medications: ? calcium gluconate    ? citrate dextrose     ? dextrose 5% lactated ringers Stopped (10/11/21 0918)  ? lactated ringers Stopped (10/12/21 2239)  ? thiamine injection Stopped (10/12/21 2308)  ? ? aspirin EC  81 mg Oral Daily  ? atorvastatin  40 mg Oral Daily  ? Chlorhexidine Gluconate Cloth  6 each Topical Q0600  ? cinacalcet  90 mg Oral Q supper  ? clopidogrel  75 mg Oral Daily  ? darbepoetin (ARANESP) injection - DIALYSIS  200 mcg Intravenous Q Thu-HD  ? dorzolamide-timolol  1 drop Left Eye BID  ? heparin injection (subcutaneous)  5,000 Units Subcutaneous Q8H  ? heparin sodium (porcine)  1,000 Units Intracatheter Once  ? insulin aspart  0-6 Units Subcutaneous TID WC  ? insulin glargine-yfgn  14 Units Subcutaneous QHS  ? lidocaine-prilocaine  1 application Topical UD  ? mouth rinse  15 mL Mouth Rinse BID  ? multivitamin  1 tablet Oral QHS  ? nutrition supplement (JUVEN)  1 packet Oral BID BM  ? pantoprazole sodium  40 mg Oral Daily  ? sevelamer carbonate  1.6 g Oral TID WC  ? [START ON 10/15/2021] thiamine  100 mg Oral Daily  ? ? ? ? ?

## 2021-10-13 NOTE — Progress Notes (Signed)
Inpatient Rehab Admissions Coordinator:  ? ?Pt. Is not yet medically ready for CIR, I will continue to follow for potential CIR admit pending insurance auth and bed availability.  ? ?Clemens Catholic, MS, CCC-SLP ?Rehab Admissions Coordinator  ?[phone] (celll) ?[phone] (office) ? ?

## 2021-10-13 NOTE — Progress Notes (Signed)
Orthopedic Tech Progress Note ?Patient Details:  ?[name] ?05-Nov-1980 ?[id number] ?Called in rentention sock to [facility] ?Patient ID: [name], female   DOB: 29-Oct-1980, 41 y.o.   MRN: [id number] ? ?[name] [name] ?10/13/2021, 11:02 AM ? ?

## 2021-10-13 NOTE — Progress Notes (Addendum)
Hypoglycemic Event ? ?CBG: 52 ? ?Treatment: 2 tubes glucose gel ? ?Symptoms: None ? ?Follow-up CBG: Time:2353 ? CBG Result:52 ? ?Admin 63ml D50 ? ?Recheck 152 ? ? ?Possible Reasons for Event: Unknown ? ?Comments/MD notified:will notify ? ? ? ? ?Brooke Bonito ? ? ?

## 2021-10-13 NOTE — Progress Notes (Signed)
PT Cancellation Note ? ?Patient Details ?Name: [name] ?MRN: [id number] ?DOB: 1981/02/13 ? ? ?Cancelled Treatment:    Reason Eval/Treat Not Completed: Patient at procedure or test/unavailable, will follow-up for PT treatment as schedule permits. ? ?Mabeline Caras, PT, DPT ?Acute Rehabilitation Services  ?Pager [phone] ?Office [phone] ? ?Derry Lory ?10/13/2021, 2:26 PM ?

## 2021-10-13 NOTE — Plan of Care (Signed)
  Problem: Education: Goal: Knowledge of General Education information will improve Description Including pain rating scale, medication(s)/side effects and non-pharmacologic comfort measures Outcome: Progressing   Problem: Health Behavior/Discharge Planning: Goal: Ability to manage health-related needs will improve Outcome: Progressing   

## 2021-10-13 NOTE — Progress Notes (Addendum)
?  Progress Note ? ? ? ?10/13/2021 ?10:08 AM ?2 Days Post-Op ? ?Subjective:  no complaints ? ?Tm 99.7 now afebrile ?Vitals:  ? 10/13/21 0309 10/13/21 1610  ?BP: 132/74 (!) 97/52  ?Pulse: 94 97  ?Resp: 18 17  ?Temp: 98 ?F (36.7 ?C) 98.5 ?F (36.9 ?C)  ?SpO2: 100% 98%  ? ? ?Physical Exam: ?Incisions:  looks good with staples in tact ? ? ? ? ?CBC ?   ?Component Value Date/Time  ? WBC 11.9 (H) 10/12/2021 0805  ? RBC 3.54 (L) 10/12/2021 0805  ? HGB 10.2 (L) 10/12/2021 0805  ? HGB 10.6 (L) 05/18/2020 9604  ? HCT 35.6 (L) 10/12/2021 0805  ? HCT 35.1 05/18/2020 0927  ? PLT 318 10/12/2021 0805  ? PLT 186 05/18/2020 0927  ? MCV 100.6 (H) 10/12/2021 0805  ? MCV 89 05/18/2020 0927  ? MCH 28.8 10/12/2021 0805  ? MCHC 28.7 (L) 10/12/2021 0805  ? RDW 22.5 (H) 10/12/2021 0805  ? RDW 13.9 05/18/2020 0927  ? LYMPHSABS 2.3 10/12/2021 0805  ? LYMPHSABS 1.6 05/18/2020 0927  ? MONOABS 1.1 (H) 10/12/2021 0805  ? EOSABS 0.5 10/12/2021 0805  ? EOSABS 0.6 (H) 05/18/2020 5409  ? BASOSABS 0.1 10/12/2021 0805  ? BASOSABS 0.0 05/18/2020 8119  ? ? ?BMET ?   ?Component Value Date/Time  ? NA 135 10/12/2021 0805  ? NA 133 (L) 05/18/2020 1478  ? K 4.8 10/12/2021 0805  ? CL 96 (L) 10/12/2021 0805  ? CO2 27 10/12/2021 0805  ? GLUCOSE 77 10/12/2021 0805  ? BUN 56 (H) 10/12/2021 0805  ? BUN 45 (H) 05/18/2020 2956  ? CREATININE 5.79 (H) 10/12/2021 0805  ? CALCIUM 11.2 (H) 10/12/2021 0805  ? GFRNONAA 9 (L) 10/12/2021 0805  ? GFRAA 6 (L) 05/18/2020 2130  ? ? ?INR ?   ?Component Value Date/Time  ? INR 1.2 08/10/2021 2212  ? ? ? ?Intake/Output Summary (Last 24 hours) at 10/13/2021 1008 ?Last data filed at 10/13/2021 8657 ?Gross per 24 hour  ?Intake 152.59 ml  ?Output -276 ml  ?Net 428.59 ml  ? ? ? ?Assessment/Plan:  41 y.o. female is s/p right above knee amputation  2 Days Post-Op ? ?-bandage removed today and incision looks good with staples in tact. ?-will order a retention sock ?-f/u with [facility] in 4 weeks for staple removal. ?-will see again on Monday.  Please call  if any questions over the weekend. ? ? ?[name], PA-C ?[facility] ?[phone] ?10/13/2021 ?10:08 AM ? ? ? ?I agree with the above.  She is postop day 2 from a right above-knee amputation.  Her incision looks good.  She can go to rehab when a bed is available. ? ?[name] ? ? ? ?  ? ?

## 2021-10-13 NOTE — Progress Notes (Signed)
PROGRESS NOTE  Maykayla Highley TDV:[id number] DOB: 10/15/1980 DOA: 09/15/2021 PCP: Inc, [facility]   LOS: 28 days   Brief Narrative / Interim history: [name] is a 41 y.o. female with medical history significant for poorly controlled DMT2, ESRD on TTS HD, HTN with poor compliance with medical treatment and HD was sent after completing dialysis session due to staff concern for altered mental status.  She has a recent history of TMA of the right foot about 2 months ago, grew MSSA and was on Ancef with dialysis.  Vascular surgery as well as nephrology consulted.  Hospital course complicated by an episode of lethargy, minimal responsiveness on 2/10 requiring rapid response.  An MRI of the brain showed scattered punctate acute infarcts bilaterally.  Neurology consulted.  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  Neurology recommends Plex, status post HD catheter placed.  Received #1 Plex on 3/1   Subjective / 24h Interval events: Mild pain at the amputation site.  No chest pain, no shortness of breath.  Assesement and Plan: Principal Problem:   Osteomyelitis ([place]) Active Problems:   Anemia of chronic disease   Hyperglycemia due to type 2 diabetes mellitus (HCC)   ESRD on hemodialysis (HCC)   Acute encephalopathy   Cerebral embolism with cerebral infarction   Pressure injury of skin   PFO (patent foramen ovale)   Malnutrition of moderate degree   Ptosis of both eyelids   Assessment and Plan: Principal problem Embolic stroke, with positive bubble study -neurology consulted and following.  An MRI of the brain 2/10 showed scattered punctate acute infarcts throughout both cerebral hemispheres which appeared to be embolic. A transesophageal echo done on 2/20 showed small PFO with no thrombus.  Completed stroke work-up.  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  Status post 1 session.  She will get 5 sessions, every other day  ESRD-nephrology following, continue HD as scheduled  Hypotension-previously on midodrine, now off  Moderate malnutrition-has a core track in place.  Encourage p.o. intake, spouse tells me he has been eating more.  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 10/11/2021 by vascular surgery  DM2-continue glargine and sliding scale  CBG (last 3)  Recent Labs    10/13/21 0806 10/13/21 0807 10/13/21 0846  GLUCAP 44* 48* 120*     Scheduled Meds:  aspirin EC  81 mg Oral Daily   atorvastatin  40 mg Oral Daily   Chlorhexidine Gluconate Cloth  6 each Topical Q0600   cinacalcet  90 mg Oral Q supper   clopidogrel  75 mg Oral Daily   darbepoetin (ARANESP) injection - DIALYSIS  200 mcg Intravenous Q Thu-HD   dorzolamide-timolol  1 drop Left Eye BID   heparin injection (subcutaneous)  5,000 Units Subcutaneous Q8H   heparin sodium (porcine)  1,000 Units Intracatheter Once   insulin aspart  0-6 Units Subcutaneous TID WC   insulin glargine-yfgn  10 Units Subcutaneous QHS   lidocaine-prilocaine  1 application Topical UD   mouth rinse  15 mL Mouth Rinse BID   multivitamin  1 tablet Oral QHS   nutrition supplement (JUVEN)  1 packet Oral BID BM   pantoprazole sodium  40 mg Oral Daily   sevelamer carbonate  1.6 g Oral TID WC   [START ON 10/15/2021] thiamine  100 mg Oral Daily   Continuous Infusions:  calcium gluconate     citrate dextrose     dextrose 5% lactated ringers Stopped (10/11/21 0918)   lactated ringers Stopped (10/12/21 2239)   thiamine injection 250 mg (10/13/21 1045)   PRN Meds:.acetaminophen, acetaminophen, alteplase, diphenhydrAMINE, guaiFENesin-dextromethorphan, heparin, heparin, iohexol, lidocaine (PF), lidocaine-prilocaine, ondansetron  (ZOFRAN) IV, oxyCODONE-acetaminophen, pentafluoroprop-tetrafluoroeth, phenol  Diet Orders (From admission, onward)     Start     Ordered   10/11/21 1327  Diet renal with fluid restriction Fluid restriction: 1200 mL Fluid; Room service appropriate? Yes; Fluid consistency: Thin  Diet effective now       Question Answer Comment  Fluid restriction: 1200 mL Fluid   Room service appropriate? Yes   Fluid consistency: Thin      10/11/21 1326            DVT prophylaxis: heparin injection 5,000 Units Start: 10/09/21 1400 SCD's Start: 09/17/21 1002   Lab Results  Component Value Date   PLT 318 10/12/2021      Code Status: Full Code  Family Communication: s/o at bedside  Status is: Inpatient  Remains inpatient appropriate because: severity of illness   Level of care: Telemetry Medical  Consultants:  Neurology  Procedures:  2D echo  Microbiology  none  Antimicrobials: none    Objective: Vitals:   10/12/21 2007 10/12/21 2336 10/13/21 0309 10/13/21 0808  BP: 117/69 128/70 132/74 (!) 97/52  Pulse: 95 94 94 97  Resp: 18 18 18 17   Temp: 99.7 F (37.6 C) 97.8 F (36.6 C) 98 F (36.7 C) 98.5 F (36.9 C)  TempSrc: Oral Axillary Oral Oral  SpO2: 100% 100% 100% 98%  Weight:   62 kg   Height:        Intake/Output Summary (Last 24 hours) at 10/13/2021 1055 Last data filed at 10/13/2021 0904 Gross per 24 hour  Intake 152.59 ml  Output -276 ml  Net 428.59 ml    Wt Readings from Last 3 Encounters:  10/13/21 62 kg  09/04/21 60.8 kg  08/25/21 61 kg    Examination:  Constitutional: NAD Eyes: lids and conjunctivae normal, no scleral icterus ENMT: mmm Neck: normal, supple Respiratory: clear to auscultation bilaterally, no wheezing, no crackles.  Cardiovascular: Regular rate and rhythm, no murmurs / rubs / gallops. No LE edema. Abdomen: soft, no distention, no tenderness. Bowel sounds positive.  Skin: no rashes Neurologic: no focal deficits, equal  strength  Data Reviewed: I have independently reviewed following labs and imaging studies   CBC Recent Labs  Lab 10/07/21 1152 10/09/21 1204 10/10/21 1418 10/11/21 0950 10/12/21 0805  WBC 9.7 9.5 8.8  --  11.9*  HGB 8.7* 8.6* 8.6* 12.2 10.2*  HCT 30.0* 29.1* 29.4* 36.0 35.6*  PLT 298 287 310  --  318  MCV 98.7 99.0 99.3  --  100.6*  MCH 28.6 29.3 29.1  --  28.8  MCHC 29.0* 29.6* 29.3*  --  28.7*  RDW 25.1* 23.9* 23.6*  --  22.5*  LYMPHSABS  --   --   --   --  2.3  MONOABS  --   --   --   --  1.1*  EOSABS  --   --   --   --  0.5  BASOSABS  --   --   --   --  0.1     Recent Labs  Lab 10/07/21 1152  10/09/21 1204 10/10/21 1418 10/11/21 0950 10/12/21 0805  NA 131* 133* 128* 132* 135  K 5.7* 5.1 5.5* 4.8 4.8  CL 92* 96* 92* 98 96*  CO2 27 27 24   --  27  GLUCOSE 188* 223* 184* 69* 77  BUN 84* 84* 110* 47* 56*  CREATININE 7.81* 6.52* 7.61* 5.20* 5.79*  CALCIUM 10.2 10.5* 10.7*  --  11.2*  AST  --  20  --   --   --   ALT  --  15  --   --   --   ALKPHOS  --  96  --   --   --   BILITOT  --  0.3  --   --   --   ALBUMIN 2.1* 2.1* 2.1*  --  2.9*     ------------------------------------------------------------------------------------------------------------------ No results for input(s): CHOL, HDL, LDLCALC, TRIG, CHOLHDL, LDLDIRECT in the last 72 hours.  Lab Results  Component Value Date   HGBA1C 10.4 (H) 08/11/2021   ------------------------------------------------------------------------------------------------------------------ No results for input(s): TSH, T4TOTAL, T3FREE, THYROIDAB in the last 72 hours.  Invalid input(s): FREET3  Cardiac Enzymes No results for input(s): CKMB, TROPONINI, MYOGLOBIN in the last 168 hours.  Invalid input(s): CK ------------------------------------------------------------------------------------------------------------------ No results found for: BNP  CBG: Recent Labs  Lab 10/13/21 0104 10/13/21 0308 10/13/21 0806  10/13/21 0807 10/13/21 0846  GLUCAP 152* 122* 44* 48* 120*     No results found for this or any previous visit (from the past 240 hour(s)).   Radiology Studies: No results found.   Marzetta Board, MD, PhD [facility]  Between 7 am - 7 pm I am available, please contact me via Amion (for emergencies) or Securechat (non urgent messages)  Between 7 pm - 7 am I am not available, please contact night coverage MD/APP via Amion

## 2021-10-13 NOTE — Progress Notes (Signed)
SLP Cancellation Note ? ?Patient Details ?Name: [name] ?MRN: [id number] ?DOB: 12/26/1980 ? ? ?Cancelled treatment:        Attempted to see pt for therapy and she is out of room. Will continue efforts when able.  ? ? ?Houston Siren ?10/13/2021, 2:13 PM ?Cranford Mon.Ed CCC-SLP ?Speech-Language Pathologist ?Pager [phone] ?Office [phone] ? ?

## 2021-10-13 NOTE — Progress Notes (Signed)
Hypoglycemic Event ? ?CBG: 48 ? ?Treatment: D50 25 mL (12.5 gm) ? ?Symptoms: None ? ?Follow-up CBG: [id number] CBG Result:120 ? ?Possible Reasons for Event: Inadequate meal intake, Medication regimen:  , and Other:   ? ?Comments/MD notified:yes PA Shanon Brow ? ? ? ?Guinevere Scarlet ? ? ?

## 2021-10-14 DIAGNOSIS — M869 Osteomyelitis, unspecified: Secondary | ICD-10-CM | POA: Diagnosis not present

## 2021-10-14 LAB — THERAPEUTIC PLASMA EXCHANGE (BLOOD BANK)
Plasma volume needed: 2100
Unit division: 0
Unit division: 0
Unit division: 0
Unit division: 0
Unit division: 0

## 2021-10-14 LAB — BASIC METABOLIC PANEL
Anion gap: 15 (ref 5–15)
BUN: 43 mg/dL — ABNORMAL HIGH (ref 6–20)
CO2: 23 mmol/L (ref 22–32)
Calcium: 10.5 mg/dL — ABNORMAL HIGH (ref 8.9–10.3)
Chloride: 101 mmol/L (ref 98–111)
Creatinine, Ser: 5.74 mg/dL — ABNORMAL HIGH (ref 0.44–1.00)
GFR, Estimated: 9 mL/min — ABNORMAL LOW (ref >60)
Glucose, Bld: 51 mg/dL — ABNORMAL LOW (ref 70–99)
Potassium: 6.2 mmol/L — ABNORMAL HIGH (ref 3.5–5.1)
Sodium: 139 mmol/L (ref 135–145)

## 2021-10-14 LAB — GLUCOSE, CAPILLARY
Glucose-Capillary: 114 mg/dL — ABNORMAL HIGH (ref 70–99)
Glucose-Capillary: 115 mg/dL — ABNORMAL HIGH (ref 70–99)
Glucose-Capillary: 119 mg/dL — ABNORMAL HIGH (ref 70–99)
Glucose-Capillary: 132 mg/dL — ABNORMAL HIGH (ref 70–99)
Glucose-Capillary: 60 mg/dL — ABNORMAL LOW (ref 70–99)
Glucose-Capillary: 63 mg/dL — ABNORMAL LOW (ref 70–99)
Glucose-Capillary: 86 mg/dL (ref 70–99)
Glucose-Capillary: 96 mg/dL (ref 70–99)

## 2021-10-14 LAB — CBC
HCT: 29.8 % — ABNORMAL LOW (ref 36.0–46.0)
Hemoglobin: 8.8 g/dL — ABNORMAL LOW (ref 12.0–15.0)
MCH: 28.9 pg (ref 26.0–34.0)
MCHC: 29.5 g/dL — ABNORMAL LOW (ref 30.0–36.0)
MCV: 98 fL (ref 80.0–100.0)
Platelets: 286 10*3/uL (ref 150–400)
RBC: 3.04 MIL/uL — ABNORMAL LOW (ref 3.87–5.11)
RDW: 20.7 % — ABNORMAL HIGH (ref 11.5–15.5)
WBC: 16.2 10*3/uL — ABNORMAL HIGH (ref 4.0–10.5)
nRBC: 0.3 % — ABNORMAL HIGH (ref 0.0–0.2)

## 2021-10-14 MED ORDER — INSULIN GLARGINE-YFGN 100 UNIT/ML ~~LOC~~ SOLN
5.0000 [IU] | Freq: Every day | SUBCUTANEOUS | Status: DC
  Administered 2021-10-14 – 2021-10-19 (×6): 5 [IU] via SUBCUTANEOUS
  Filled 2021-10-14 (×7): qty 0.05

## 2021-10-14 MED ORDER — DEXTROSE 50 % IV SOLN
INTRAVENOUS | Status: AC
  Filled 2021-10-14: qty 50

## 2021-10-14 MED ORDER — DEXTROSE 50 % IV SOLN
12.5000 g | INTRAVENOUS | Status: AC
  Administered 2021-10-14: 12.5 g via INTRAVENOUS

## 2021-10-14 NOTE — Progress Notes (Addendum)
Subjective:  Seen and examined in HD>  BFR 400 mL/ min via RUE AVF, UF goal even.   ? ?Objective ?Vital signs in last 24 hours: ?Vitals:  ? 10/14/21 0716 10/14/21 0730 10/14/21 0800 10/14/21 0830  ?BP: (!) 96/58 (!) 92/59 95/60 102/64  ?Pulse: 87 89 89 92  ?Resp:  14 11   ?Temp:      ?TempSrc:      ?SpO2: 100% 100% 100%   ?Weight:      ?Height:      ? ?Weight change: 2.4 kg ? ?Physical Exam: ?General: Chronically ill-appearing young female NAD, sleeping, aroiusable ?Heart: RRR no appreciated MRG ?Lungs: CTA nonlabored breathing ?Abdomen: NABS, S, NT ND ?Extremities: No lower extremity edema/right AKA dressing dry clear  ?dialysis Access: RUA AVF positive bruit, right IJ temp HD cath ? ?Dialysis Orders: ?[facility] ?3h 58min   400/ 600   59.5kg   2/2 bath  Prof 2   RU AV fistula  Hep 4000  ?Mircera 200 mcg IV every 2 weeks - last 1/26 ?Ancef 2g 3 times weekly HD ?Covid neg 2/3, hep B SAg neg 2/4 ? ?Problem/Plan: ?Embolic stroke with AMS -neuro following, acute embolic CVA / Repeat MRI with no acute findings.  With PFO on TTE. TEE showed small PFO and no clot. Blood Cx 2/4 negative. S/p course of IV abx.  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 plan for LP after 5 days off of ASA and Plavix. S/p R IJ temp HD cath 3/1 by IR.  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 Trula Slade [facility] following.  To go to CIR at some point.  ?ESRD - Usually TTS schedule - Tolerated yesterday's HD with net UF 1.-Trying to keep SBP > 100 on HD due to #1. Wants to continue HD at this time.  Continue to monitor behavior during HD and tolerate dialysis in chair prior to d/c. ?HTN/volume -some element of hypotension this morning improved with fluid /midodrine with HD.  Keep even on HD tomorrow,Below prior EDW, will need to be lowered on d/c. ?Sepsis/ shock - resolved  ?Anemia of CKD- last Hgb stable 10.2   continue Aranesp 299mcg q Thurs. Transfuse pRBC prn.  ?MBD: CorrCa high, Phos ok. Continue home sensipar/binders, hold VDRA for now.   ?Severe protein calorie malnutrition: NG tube in place with Nepro TF's + supplements. ?DMT2 - per PMD  ?Access: LUE AVF with some eschar- sticking away from now, monitor closely ?Dispo - likely will need SNF placement/versus CIR ? ?Madelon Lips MD ?[facility] ?10/14/2021,8:51 AM ? LOS: 29 days  ? ?Labs: ?Basic Metabolic Panel: ?Recent Labs  ?Lab 10/07/21 ?1152 10/09/21 ?1204 10/10/21 ?1418 10/11/21 ?0950 10/12/21 ?0805 10/14/21 ?9924  ?NA 131*   < > 128* 132* 135 139  ?K 5.7*   < > 5.5* 4.8 4.8 6.2*  ?CL 92*   < > 92* 98 96* 101  ?CO2 27   < > 24  --  27 23  ?GLUCOSE 188*   < > 184* 69* 77 51*  ?BUN 84*   < > 110* 47* 56* 43*  ?CREATININE 7.81*   < > 7.61* 5.20* 5.79* 5.74*  ?CALCIUM 10.2   < > 10.7*  --  11.2* 10.5*  ?PHOS 5.0*  --  3.9  --  5.8*  --   ? < > =  values in this interval not displayed.  ? ?Liver Function Tests: ?Recent Labs  ?Lab 10/09/21 ?1204 10/10/21 ?1418 10/12/21 ?0805  ?AST 20  --   --   ?ALT 15  --   --   ?ALKPHOS 96  --   --   ?BILITOT 0.3  --   --   ?PROT 7.3  --   --   ?ALBUMIN 2.1* 2.1* 2.9*  ? ?No results for input(s): LIPASE, AMYLASE in the last 168 hours. ?No results for input(s): AMMONIA in the last 168 hours. ?CBC: ?Recent Labs  ?Lab 10/07/21 ?1152 10/09/21 ?1204 10/10/21 ?1418 10/11/21 ?0950 10/12/21 ?0805 10/14/21 ?7591  ?WBC 9.7 9.5 8.8  --  11.9* 16.2*  ?NEUTROABS  --   --   --   --  7.9*  --   ?HGB 8.7* 8.6* 8.6* 12.2 10.2* 8.8*  ?HCT 30.0* 29.1* 29.4* 36.0 35.6* 29.8*  ?MCV 98.7 99.0 99.3  --  100.6* 98.0  ?PLT 298 287 310  --  318 286  ? ?Cardiac Enzymes: ?No results for input(s): CKTOTAL, CKMB, CKMBINDEX, TROPONINI in the last 168 hours. ?CBG: ?Recent Labs  ?Lab 10/13/21 ?2306 10/14/21 ?6384 10/14/21 ?6659 10/14/21 ?9357 10/14/21 ?0177  ?GLUCAP 72 60* 63* 119* 86  ? ? ?Studies/Results: ?No results found. ?Medications: ? dextrose  5% lactated ringers 30 mL/hr at 10/13/21 2138  ? lactated ringers Stopped (10/12/21 2239)  ? thiamine injection 250 mg (10/13/21 2120)  ? ? aspirin EC  81 mg Oral Daily  ? atorvastatin  40 mg Oral Daily  ? Chlorhexidine Gluconate Cloth  6 each Topical Q0600  ? cinacalcet  90 mg Oral Q supper  ? clopidogrel  75 mg Oral Daily  ? darbepoetin (ARANESP) injection - DIALYSIS  200 mcg Intravenous Q Thu-HD  ? dextrose      ? dorzolamide-timolol  1 drop Left Eye BID  ? heparin injection (subcutaneous)  5,000 Units Subcutaneous Q8H  ? insulin aspart  0-6 Units Subcutaneous TID WC  ? insulin glargine-yfgn  10 Units Subcutaneous QHS  ? lidocaine-prilocaine  1 application Topical UD  ? mouth rinse  15 mL Mouth Rinse BID  ? multivitamin  1 tablet Oral QHS  ? nutrition supplement (JUVEN)  1 packet Oral BID BM  ? pantoprazole sodium  40 mg Oral Daily  ? sevelamer carbonate  1.6 g Oral TID WC  ? [START ON 10/15/2021] thiamine  100 mg Oral Daily  ? ? ? ? ?

## 2021-10-14 NOTE — Progress Notes (Signed)
Pt transported to hemodialysis  

## 2021-10-14 NOTE — Progress Notes (Signed)
573mls postive fluid balance, orders was to keep even and keep sbp above 100,  sbp less than 100 most of treatment but above 90.  pre bp 110/60 post bp 120/62 pre weight 58.0 post weight 58.4 kg .  2 bandages to rua avf no bleeding dressing cdi. gave percocet for pain mid treatment. ?

## 2021-10-14 NOTE — Progress Notes (Signed)
PROGRESS NOTE  [name] VOZ:[id number] DOB: [birth year] DOA: 09/15/2021 PCP: [facility], [facility]   LOS: 29 days   Brief Narrative / Interim history: [name] is a 41 y.o. female with medical history significant for poorly controlled DMT2, ESRD on TTS HD, HTN with poor compliance with medical treatment and HD was sent after completing dialysis session due to staff concern for altered mental status.  She has a recent history of TMA of the right foot about 2 months ago, grew MSSA and was on Ancef with dialysis.  Vascular surgery as well as nephrology consulted.  Hospital course complicated by an episode of lethargy, minimal responsiveness on 2/10 requiring rapid response.  An MRI of the brain showed scattered punctate acute infarcts bilaterally.  Neurology consulted.  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  Neurology recommends Plex, status post HD catheter placed.  Received #1 Plex on 3/1   Subjective / 24h Interval events: Seen in dialysis.  Resting, appears comfortable.  No complaints.  Assesement and Plan: Principal Problem:   Osteomyelitis ([place]) Active Problems:   Anemia of chronic disease   Hyperglycemia due to type 2 diabetes mellitus (HCC)   ESRD on hemodialysis (HCC)   Acute encephalopathy   Cerebral embolism with cerebral infarction   Pressure injury of skin   PFO (patent foramen ovale)   Malnutrition of moderate degree   Ptosis of both eyelids   Assessment and Plan: Principal problem Embolic stroke, with positive bubble study -neurology consulted and following.  An MRI of the brain 2/10 showed scattered punctate acute infarcts throughout both cerebral hemispheres which appeared to be embolic. A transesophageal echo done on 2/20 showed small PFO with no thrombus.  Completed stroke work-up.  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  Plan for 5 sessions, already done on 3/1, 3/3, 3 sessions remaining neurology following  ESRD-nephrology following, continue HD as scheduled.  HD today  Hypotension-previously on midodrine, now off  Moderate malnutrition-has a core track in place.  Encourage p.o. intake, spouse tells me he has been eating more.  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  Wound looks good  DM2-continue glargine and sliding scale.  Continue to decrease insulin regimen due to hypoglycemia  CBG (last 3)  Recent Labs    10/14/21 0349 10/14/21 0418 10/14/21 0626  GLUCAP 63* 119* 86     Scheduled Meds:  aspirin EC  81 mg Oral Daily   atorvastatin  40 mg Oral Daily   Chlorhexidine Gluconate Cloth  6 each Topical Q0600   cinacalcet  90 mg Oral Q supper   clopidogrel  75 mg Oral Daily   darbepoetin (ARANESP) injection - DIALYSIS  200 mcg Intravenous Q Thu-HD   dextrose       dorzolamide-timolol  1 drop Left Eye BID   heparin injection (subcutaneous)  5,000 Units Subcutaneous Q8H   insulin aspart  0-6 Units Subcutaneous TID WC   insulin glargine-yfgn  10 Units Subcutaneous QHS   lidocaine-prilocaine  1 application Topical UD   mouth rinse  15 mL Mouth Rinse BID   multivitamin  1 tablet Oral QHS   nutrition supplement (JUVEN)  1 packet Oral BID BM   pantoprazole sodium  40 mg Oral Daily   sevelamer carbonate  1.6 g Oral TID WC   [START ON 10/15/2021] thiamine  100 mg Oral Daily   Continuous Infusions:  dextrose 5% lactated ringers 30 mL/hr at 10/13/21 2138   lactated ringers Stopped (10/12/21 2239)   thiamine injection 250 mg (10/13/21 2120)   PRN Meds:.acetaminophen, alteplase, guaiFENesin-dextromethorphan, heparin, heparin, iohexol, lidocaine (PF), lidocaine-prilocaine, ondansetron (ZOFRAN) IV,  oxyCODONE-acetaminophen, pentafluoroprop-tetrafluoroeth, phenol  Diet Orders (From admission, onward)     Start     Ordered   10/11/21 1327  Diet renal with fluid restriction Fluid restriction: 1200 mL Fluid; Room service appropriate? Yes; Fluid consistency: Thin  Diet effective now       Question Answer Comment  Fluid restriction: 1200 mL Fluid   Room service appropriate? Yes   Fluid consistency: Thin      10/11/21 1326            DVT prophylaxis: heparin injection 5,000 Units Start: 10/09/21 1400 SCD's Start: 09/17/21 1002   Lab Results  Component Value Date   PLT 286 10/14/2021      Code Status: Full Code  Family Communication: s/o at bedside  Status is: Inpatient  Remains inpatient appropriate because: severity of illness   Level of care: Telemetry Medical  Consultants:  Neurology  Procedures:  2D echo  Microbiology  none  Antimicrobials: none    Objective: Vitals:   10/14/21 0930 10/14/21 1000 10/14/21 1030 10/14/21 1100  BP: (!) 98/41 (!) 83/48 (!) 102/53 (!) 105/56  Pulse: 90 92 93   Resp: 15 14 12    Temp:      TempSrc:      SpO2:  100% 100%   Weight:      Height:        Intake/Output Summary (Last 24 hours) at 10/14/2021 1117 Last data filed at 10/14/2021 0500 Gross per 24 hour  Intake 589.87 ml  Output --  Net 589.87 ml    Wt Readings from Last 3 Encounters:  10/14/21 58 kg  09/04/21 60.8 kg  08/25/21 61 kg    Examination:  Constitutional: NAD Eyes: lids and conjunctivae normal, no scleral icterus ENMT: mmm Neck: normal, supple Respiratory: clear to auscultation bilaterally, no wheezing, no crackles. Normal respiratory effort.  Cardiovascular: Regular rate and rhythm, no murmurs / rubs / gallops.  Abdomen: soft, no distention, no tenderness. Bowel sounds positive.  Skin: no rashes  Data Reviewed: I have independently reviewed following labs and imaging studies   CBC Recent Labs  Lab 10/07/21 1152 10/09/21 1204  10/10/21 1418 10/11/21 0950 10/12/21 0805 10/14/21 0218  WBC 9.7 9.5 8.8  --  11.9* 16.2*  HGB 8.7* 8.6* 8.6* 12.2 10.2* 8.8*  HCT 30.0* 29.1* 29.4* 36.0 35.6* 29.8*  PLT 298 287 310  --  318 286  MCV 98.7 99.0 99.3  --  100.6* 98.0  MCH 28.6 29.3 29.1  --  28.8 28.9  MCHC 29.0* 29.6* 29.3*  --  28.7* 29.5*  RDW 25.1* 23.9* 23.6*  --  22.5* 20.7*  LYMPHSABS  --   --   --   --  2.3  --   MONOABS  --   --   --   --  1.1*  --   EOSABS  --   --   --   --  0.5  --   BASOSABS  --   --   --   --  0.1  --      Recent Labs  Lab 10/07/21 1152 10/09/21 1204 10/10/21 1418 10/11/21 0950 10/12/21 0805 10/14/21 0218  NA 131* 133* 128* 132* 135 139  K 5.7* 5.1 5.5* 4.8 4.8 6.2*  CL 92* 96* 92* 98 96* 101  CO2 27 27 24   --  27 23  GLUCOSE 188* 223* 184* 69* 77 51*  BUN 84* 84* 110* 47* 56* 43*  CREATININE 7.81* 6.52* 7.61* 5.20* 5.79* 5.74*  CALCIUM 10.2 10.5* 10.7*  --  11.2* 10.5*  AST  --  20  --   --   --   --   ALT  --  15  --   --   --   --   ALKPHOS  --  96  --   --   --   --   BILITOT  --  0.3  --   --   --   --   ALBUMIN 2.1* 2.1* 2.1*  --  2.9*  --      ------------------------------------------------------------------------------------------------------------------ No results for input(s): CHOL, HDL, LDLCALC, TRIG, CHOLHDL, LDLDIRECT in the last 72 hours.  Lab Results  Component Value Date   HGBA1C 10.4 (H) 08/11/2021   ------------------------------------------------------------------------------------------------------------------ No results for input(s): TSH, T4TOTAL, T3FREE, THYROIDAB in the last 72 hours.  Invalid input(s): FREET3  Cardiac Enzymes No results for input(s): CKMB, TROPONINI, MYOGLOBIN in the last 168 hours.  Invalid input(s): CK ------------------------------------------------------------------------------------------------------------------ No results found for: BNP  CBG: Recent Labs  Lab 10/13/21 2306 10/14/21 0319 10/14/21 0349  10/14/21 0418 10/14/21 0626  GLUCAP 72 60* 63* 119* 86     No results found for this or any previous visit (from the past 240 hour(s)).   Radiology Studies: No results found.   Marzetta Board, MD, PhD [facility]  Between 7 am - 7 pm I am available, please contact me via Amion (for emergencies) or Securechat (non urgent messages)  Between 7 pm - 7 am I am not available, please contact night coverage MD/APP via Amion

## 2021-10-14 NOTE — Progress Notes (Signed)
Report given to HD nurse Trixie Rude.  ?

## 2021-10-14 NOTE — Progress Notes (Signed)
Hypoglycemic Event ? ?CBG: 60 ? ?Treatment: 4 oz juice/soda ? ?Symptoms: None ? ?Follow-up CBG: Time: 1062 CBG Result:63 ? ?Possible Reasons for Event: Inadequate meal intake ? ?Comments/MD notified:Blount, NP ? ?Pt refused to complete juice, but she took about 70%. CBG on recheck 63. At this time pt refused PO treatment due to sore throat. D50% 12.5g given, and CBG 119 on recheck. ?D5% in LR still infusing.  ? ?Ragna Kramlich N Vyron Fronczak ? ? ?

## 2021-10-15 ENCOUNTER — Inpatient Hospital Stay (HOSPITAL_COMMUNITY): Payer: Medicare Other

## 2021-10-15 DIAGNOSIS — M869 Osteomyelitis, unspecified: Secondary | ICD-10-CM | POA: Diagnosis not present

## 2021-10-15 DIAGNOSIS — G9340 Encephalopathy, unspecified: Secondary | ICD-10-CM | POA: Diagnosis not present

## 2021-10-15 LAB — GLUCOSE, CAPILLARY
Glucose-Capillary: 104 mg/dL — ABNORMAL HIGH (ref 70–99)
Glucose-Capillary: 114 mg/dL — ABNORMAL HIGH (ref 70–99)
Glucose-Capillary: 124 mg/dL — ABNORMAL HIGH (ref 70–99)
Glucose-Capillary: 172 mg/dL — ABNORMAL HIGH (ref 70–99)
Glucose-Capillary: 197 mg/dL — ABNORMAL HIGH (ref 70–99)
Glucose-Capillary: 201 mg/dL — ABNORMAL HIGH (ref 70–99)

## 2021-10-15 MED ORDER — METHYLPREDNISOLONE SODIUM SUCC 125 MG IJ SOLR
INTRAMUSCULAR | Status: AC
  Administered 2021-10-15: 125 mg
  Filled 2021-10-15: qty 2

## 2021-10-15 MED ORDER — IPRATROPIUM-ALBUTEROL 0.5-2.5 (3) MG/3ML IN SOLN
RESPIRATORY_TRACT | Status: AC
  Administered 2021-10-15: 3 mL via RESPIRATORY_TRACT
  Filled 2021-10-15: qty 3

## 2021-10-15 MED ORDER — DIPHENHYDRAMINE HCL 50 MG/ML IJ SOLN
12.5000 mg | Freq: Once | INTRAMUSCULAR | Status: AC
  Administered 2021-10-15: 12.5 mg via INTRAVENOUS

## 2021-10-15 MED ORDER — IPRATROPIUM-ALBUTEROL 0.5-2.5 (3) MG/3ML IN SOLN
3.0000 mL | Freq: Three times a day (TID) | RESPIRATORY_TRACT | Status: DC
  Administered 2021-10-15 – 2021-10-17 (×3): 3 mL via RESPIRATORY_TRACT
  Filled 2021-10-15 (×4): qty 3

## 2021-10-15 MED ORDER — IPRATROPIUM-ALBUTEROL 0.5-2.5 (3) MG/3ML IN SOLN
3.0000 mL | Freq: Four times a day (QID) | RESPIRATORY_TRACT | Status: DC
  Administered 2021-10-15: 3 mL via RESPIRATORY_TRACT
  Filled 2021-10-15: qty 3

## 2021-10-15 MED ORDER — FAMOTIDINE IN NACL 20-0.9 MG/50ML-% IV SOLN
20.0000 mg | Freq: Two times a day (BID) | INTRAVENOUS | Status: DC
  Filled 2021-10-15: qty 50

## 2021-10-15 MED ORDER — METHYLPREDNISOLONE SODIUM SUCC 125 MG IJ SOLR
125.0000 mg | Freq: Once | INTRAMUSCULAR | Status: DC
Start: 2021-10-15 — End: 2021-10-15

## 2021-10-15 NOTE — Progress Notes (Signed)
Noticed pt upper extremities were swollen and she was c/o of shortness of breath and not being able to swallow.  Nurse notified Caren Griffins, MD and Charge Nurse. Then called Rapid and Raspatory to bedside. V/o order to hold meds temp cath removed per Nephrology, meds given See MAR. Pt slept off and on for the rest of day no food give just Icy pop and Ensures to soothe throat and give nutrition. ?

## 2021-10-15 NOTE — Progress Notes (Signed)
Subjective:  Called to bedside this AM by RRT--> marked facial edema.  Had some facial fullness yesterday but this is quite a bit more.  Eyelids are swollen.  Getting steroids and Benadryl, famotidine as well.  Pt reports her Coretrak is bothersome and it's making it hard to swallow.  No stridor.  PLEX with FFP yesterday per QOD schedule.  Reactions to this are usually at the time of the procedure (anaphylactic/ transfusion reactions).  Imaging report from nontunneled HD cath reviewed--> this is an EJ cath and has a chronically occluded R IJ.  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  D/c catheter and pursue alternative access for PLEX (may need femoral to finish out the next 3 rx) ? ?Objective ?Vital signs in last 24 hours: ?Vitals:  ? 10/15/21 0457 10/15/21 0458 10/15/21 0725 10/15/21 0907  ?BP: 135/80  120/78   ?Pulse: 93  88   ?Resp: 12 18    ?Temp: 98.4 ?F (36.9 ?C)  97.7 ?F (36.5 ?C)   ?TempSrc: Oral  Oral   ?SpO2: 100%  100% 100%  ?Weight: 60.9 kg     ?Height:      ? ?Weight change: -4 kg ? ?Physical Exam: ?General: Chronically ill-appearing young female NAD, eyes closed ?HEENT: marked facial edema with eyelid swelling as well- no stridor ?Heart: RRR no appreciated MRG ?Lungs: CTA nonlabored breathing ?Abdomen: NABS, S, NT ND ?Extremities: No lower extremity edema/right AKA dressing dry clear  ?dialysis Access: RUA AVF positive bruit, right IJ temp HD cath ? ?Dialysis Orders: ?[facility] ?3h 42min   400/ 600   59.5kg   2/2 bath  Prof 2   RU AV fistula  Hep 4000  ?Mircera 200 mcg IV every 2 weeks - last 1/26 ?Ancef 2g 3 times weekly HD ?Covid neg 2/3, hep B SAg neg 2/4 ? ?Problem/Plan: ?Embolic stroke with AMS -neuro following, acute embolic CVA / Repeat MRI with no acute findings.  With PFO on TTE. TEE showed small PFO and no clot. Blood Cx 2/4 negative. S/p course of IV abx.  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 B/L ptosis - b/l vision loss & loss of eye  motility. plan for LP after 5 days off of ASA and Plavix. S/p R IJ temp HD cath 3/1 by IR.  Getting PLEX QOD.  In the setting of marked facial edema and possible SVC- like physiology- R EJ nontunneled HD cath removed (See #12)  Will need to consider alternative access--> ? fem ?Hx R BKA (2/5 by [facility]):/Status post right AKA 10/11/21 by Dr. Trula Slade [facility] following.  To go to CIR at some point.  ?ESRD - Usually TTS schedule - Tolerated yesterday's HD with net UF 1.-Trying to keep SBP > 100 on HD due to #1. Wants to continue HD at this time.  Continue to monitor behavior during HD and tolerate dialysis in chair prior to d/c. ?HTN/volume -some element of hypotension this morning improved with fluid /midodrine with HD.  Keep even on HD tomorrow,Below prior EDW, will need to be lowered on d/c. ?Sepsis/ shock - resolved  ?Anemia of CKD- last Hgb stable 10.2  continue Aranesp 227mcg q Thurs. Transfuse pRBC prn.  ?MBD: CorrCa high, Phos ok. Continue home sensipar/binders, hold VDRA for now.   ?Severe protein calorie malnutrition: NG tube in place with Nepro TF's + supplements. ?DMT2 - per PMD  ?Access: LUE AVF with some eschar- sticking away from now, monitor closely ?Facial edema- AM of  10/15/21--> ? Delayed reaction to PLEX with FFP vs SVC- Mar reviewed, no new meds.  Got Solumedrol/ benadryl/ famotidine, R EJ catheter removed.  D/w hospitalist.  Since pt has had her AKA and no bleeding > 48 hrs out, OK to convert PLEX to albumin replacement fluid.  Will need alternative access to complete PLEX ?Dispo - likely will need SNF placement/versus CIR ? ?Madelon Lips MD ?[facility] ?10/15/2021,9:28 AM ? LOS: 30 days  ? ?Labs: ?Basic Metabolic Panel: ?Recent Labs  ?Lab 10/10/21 ?1418 10/11/21 ?0950 10/12/21 ?0805 10/14/21 ?0814  ?NA 128* 132* 135 139  ?K 5.5* 4.8 4.8 6.2*  ?CL 92* 98 96* 101  ?CO2 24  --  27 23  ?GLUCOSE 184* 69* 77 51*  ?BUN 110* 47* 56* 43*  ?CREATININE 7.61* 5.20* 5.79* 5.74*  ?CALCIUM 10.7*  --   11.2* 10.5*  ?PHOS 3.9  --  5.8*  --   ? ?Liver Function Tests: ?Recent Labs  ?Lab 10/09/21 ?1204 10/10/21 ?1418 10/12/21 ?0805  ?AST 20  --   --   ?ALT 15  --   --   ?ALKPHOS 96  --   --   ?BILITOT 0.3  --   --   ?PROT 7.3  --   --   ?ALBUMIN 2.1* 2.1* 2.9*  ? ?No results for input(s): LIPASE, AMYLASE in the last 168 hours. ?No results for input(s): AMMONIA in the last 168 hours. ?CBC: ?Recent Labs  ?Lab 10/09/21 ?1204 10/10/21 ?1418 10/11/21 ?0950 10/12/21 ?0805 10/14/21 ?4818  ?WBC 9.5 8.8  --  11.9* 16.2*  ?NEUTROABS  --   --   --  7.9*  --   ?HGB 8.6* 8.6* 12.2 10.2* 8.8*  ?HCT 29.1* 29.4* 36.0 35.6* 29.8*  ?MCV 99.0 99.3  --  100.6* 98.0  ?PLT 287 310  --  318 286  ? ?Cardiac Enzymes: ?No results for input(s): CKTOTAL, CKMB, CKMBINDEX, TROPONINI in the last 168 hours. ?CBG: ?Recent Labs  ?Lab 10/14/21 ?1622 10/14/21 ?2025 10/14/21 ?2349 10/15/21 ?5631 10/15/21 ?4970  ?GLUCAP 132* 115* 114* 104* 114*  ? ? ?Studies/Results: ?No results found. ?Medications: ? dextrose 5% lactated ringers 30 mL/hr at 10/14/21 2344  ? famotidine (PEPCID) IV    ? lactated ringers Stopped (10/12/21 2239)  ? ? aspirin EC  81 mg Oral Daily  ? atorvastatin  40 mg Oral Daily  ? Chlorhexidine Gluconate Cloth  6 each Topical Q0600  ? cinacalcet  90 mg Oral Q supper  ? clopidogrel  75 mg Oral Daily  ? darbepoetin (ARANESP) injection - DIALYSIS  200 mcg Intravenous Q Thu-HD  ? dorzolamide-timolol  1 drop Left Eye BID  ? heparin injection (subcutaneous)  5,000 Units Subcutaneous Q8H  ? insulin aspart  0-6 Units Subcutaneous TID WC  ? insulin glargine-yfgn  5 Units Subcutaneous QHS  ? ipratropium-albuterol  3 mL Nebulization Q6H  ? lidocaine-prilocaine  1 application Topical UD  ? mouth rinse  15 mL Mouth Rinse BID  ? multivitamin  1 tablet Oral QHS  ? nutrition supplement (JUVEN)  1 packet Oral BID BM  ? pantoprazole sodium  40 mg Oral Daily  ? sevelamer carbonate  1.6 g Oral TID WC  ? thiamine  100 mg Oral Daily  ? ? ? ? ?

## 2021-10-15 NOTE — Consult Note (Signed)
? ?Patient Status: [facility] - In-pt ? ?Assessment and Plan: ?Patient in need of venous access for PLEX.  ?Patient with history of ESRD on HD also with GBS requiring plasmapheresis.   ?Patient currently getting HD via RUA AVF which cannot be used on dialysis unit for PLEX.  ?A R EJ temp cath was placed by Dr. Serafina Royals 10/11/21 due to R IJ occlusion, however patient with progressive swelling to the right eye, face, jaw, and neck.  ?R EJ has been removed.   ?Consulted placed for line replacement.  ?Patient aware that a femoral line may be required.  ?She is agreeable to procedure.  ?Verbal consent obtained and witnessed by RN due to opthalmoparesis. ? ?Risks and benefits discussed with the patient including, but not limited to bleeding, infection, vascular injury, pneumothorax which may require chest tube placement, air embolism or even death ? ?All of the patient's questions were answered, patient is agreeable to proceed. ?Consent signed and in chart. ? ?______________________________________________________________________ ? ? ?History of Present Illness: ?[name] is a 41 y.o. female with history of ESRD on HD, DM2, HTN, sent by dialysis center to ED for AMS after dialysis session 09/15/21.  She was found to have a right foot infection and has now undergone R BKA 09/17/21 with revision to AKA 10/11/21.  Also during admission, patient has developed opthalmoparesis and has been seen by Neurology.  Strong suspicion for miller-fisher GBS.  R EJ temp catheter placed 10/11/21 by Dr. Serafina Royals for PLEX.  Patient now with right-sided angioedema. Temp cath removed. IR consulted for replacement.  ? ?Allergies and medications reviewed.  ? ?Review of Systems: A 12 point ROS discussed and pertinent positives are indicated in the HPI above.  All other systems are negative. ? ?Review of Systems  ?Constitutional:  Positive for fatigue. Negative for fever.  ?HENT:  Positive for facial swelling.   ?Respiratory:  Negative for cough and shortness  of breath.   ?Cardiovascular:  Negative for chest pain.  ?Gastrointestinal:  Negative for abdominal pain, constipation, diarrhea and nausea.  ?Musculoskeletal:  Negative for back pain.  ?Psychiatric/Behavioral:  Negative for behavioral problems and confusion.   ? ?Vital Signs: ?BP 120/78 (BP Location: Left Arm)   Pulse 88   Temp 97.7 ?F (36.5 ?C) (Oral)   Resp 18   Ht 5\' 1"  (1.549 m)   Wt 134 lb 4.2 oz (60.9 kg)   LMP 09/20/2021 Comment: Neg Preg Test 08/10/21. Has had a stroke and believed to have since been in the hospital .  TRK  SpO2 98%   BMI 25.37 kg/m?  ? ?Physical Exam ?Vitals and nursing note reviewed.  ?Constitutional:   ?   General: She is not in acute distress. ?   Appearance: Normal appearance. She is ill-appearing.  ?HENT:  ?   Nose:  ?   Comments: NGT in place ?   Mouth/Throat:  ?   Mouth: Mucous membranes are dry.  ?   Pharynx: Oropharynx is clear.  ?Eyes:  ?   Comments: R eye swollen shut, difficulty opening left eye  ?Neck:  ?   Comments: R sided swelling, slightly tender ?Cardiovascular:  ?   Rate and Rhythm: Normal rate.  ?Pulmonary:  ?   Effort: Pulmonary effort is normal.  ?Musculoskeletal:  ?   Cervical back: Normal range of motion.  ?Neurological:  ?   General: No focal deficit present.  ?   Mental Status: She is alert and oriented to person, place, and time. Mental status is  at baseline.  ? ? ? ?Imaging reviewed.  ? ?Labs: ? ?COAGS: ?Recent Labs  ?  08/10/21 ?2212  ?INR 1.2  ?APTT 28  ? ? ?BMP: ?Recent Labs  ?  10/09/21 ?1204 10/10/21 ?1418 10/11/21 ?0950 10/12/21 ?0805 10/14/21 ?3832  ?NA 133* 128* 132* 135 139  ?K 5.1 5.5* 4.8 4.8 6.2*  ?CL 96* 92* 98 96* 101  ?CO2 27 24  --  27 23  ?GLUCOSE 223* 184* 69* 77 51*  ?BUN 84* 110* 47* 56* 43*  ?CALCIUM 10.5* 10.7*  --  11.2* 10.5*  ?CREATININE 6.52* 7.61* 5.20* 5.79* 5.74*  ?GFRNONAA 8* 6*  --  9* 9*  ? ? ? ? ? ?Electronically Signed: ?Docia Barrier, PA ?10/15/2021, 2:29 PM ? ? ?I spent a total of 15 minutes in face to face in  clinical consultation, greater than 50% of which was counseling/coordinating care for venous access. ? ? ?

## 2021-10-15 NOTE — Progress Notes (Signed)
?  Progress Note ? ? ? ?10/15/2021 ?8:39 AM ?4 Days Post-Op ? ?Subjective:  no complaints ? ?Tm 99.7 now afebrile ?Vitals:  ? 10/15/21 0458 10/15/21 0725  ?BP:  120/78  ?Pulse:  88  ?Resp: 18   ?Temp:  97.7 ?F (36.5 ?C)  ?SpO2:  100%  ? ? ?Physical Exam: ?Incisions:  looks good with staples in tact ?No drainage ? ? ?CBC ?   ?Component Value Date/Time  ? WBC 16.2 (H) 10/14/2021 0218  ? RBC 3.04 (L) 10/14/2021 0218  ? HGB 8.8 (L) 10/14/2021 0218  ? HGB 10.6 (L) 05/18/2020 6979  ? HCT 29.8 (L) 10/14/2021 0218  ? HCT 35.1 05/18/2020 0927  ? PLT 286 10/14/2021 0218  ? PLT 186 05/18/2020 0927  ? MCV 98.0 10/14/2021 0218  ? MCV 89 05/18/2020 0927  ? MCH 28.9 10/14/2021 0218  ? MCHC 29.5 (L) 10/14/2021 0218  ? RDW 20.7 (H) 10/14/2021 0218  ? RDW 13.9 05/18/2020 0927  ? LYMPHSABS 2.3 10/12/2021 0805  ? LYMPHSABS 1.6 05/18/2020 0927  ? MONOABS 1.1 (H) 10/12/2021 0805  ? EOSABS 0.5 10/12/2021 0805  ? EOSABS 0.6 (H) 05/18/2020 4801  ? BASOSABS 0.1 10/12/2021 0805  ? BASOSABS 0.0 05/18/2020 6553  ? ? ?BMET ?   ?Component Value Date/Time  ? NA 139 10/14/2021 0218  ? NA 133 (L) 05/18/2020 7482  ? K 6.2 (H) 10/14/2021 7078  ? CL 101 10/14/2021 0218  ? CO2 23 10/14/2021 0218  ? GLUCOSE 51 (L) 10/14/2021 0218  ? BUN 43 (H) 10/14/2021 0218  ? BUN 45 (H) 05/18/2020 6754  ? CREATININE 5.74 (H) 10/14/2021 0218  ? CALCIUM 10.5 (H) 10/14/2021 0218  ? GFRNONAA 9 (L) 10/14/2021 4920  ? GFRAA 6 (L) 05/18/2020 1007  ? ? ?INR ?   ?Component Value Date/Time  ? INR 1.2 08/10/2021 2212  ? ? ? ?Intake/Output Summary (Last 24 hours) at 10/15/2021 0839 ?Last data filed at 10/14/2021 1106 ?Gross per 24 hour  ?Intake --  ?Output -574 ml  ?Net 574 ml  ? ? ? ? ?Assessment/Plan:  41 y.o. female is s/p right above knee amputation  4 Days Post-Op ? ?-Bandage removed today and incision looks good with staples in tact. ?-No drainage ?-f/u with [facility] in 4 weeks for staple removal. ? ?[name] ?[facility] ?[phone] ?10/15/2021 ?8:39  AM ? ? ? ?

## 2021-10-15 NOTE — Significant Event (Signed)
Rapid Response Event Note  ? ?Reason for Call :  ?Facial swelling  ? ?Initial Focused Assessment:  ?RN called stating that her patient has been having worsening facial swelling and periorbital edema. The patient was complaing that her throat hurt and she can't swallow, but some of that could be attributed to the coretrack that is in place. Her lips appear swollen. She is able to stick her tongue out. No stridor at this time. ? ?MD bedside to evaluate patient. MD reports that the swelling is much worse than yesterday. The patient has been receiving FFP for treatment of PLEX. MD ordered removal of RIJ catheter. Catheter removed with minimal bleeding. Chest xray obtained.  ? ? ? ? ?Interventions:  ?Solumedrol 125 mg IV  ?12.5 benadryl IV ?Famotidine IVPB ordered (awaiting from pharmacy) ? ?Plan of Care:  ?Keep patient NPO until swelling comes down. Will reassess patient's swelling ? ? ?Event Summary:  ? ?MD Notified: Dr. Hollie Salk and Dr. Cruzita Lederer bedside ?Call Time: 0830 ?Arrival Time: [phone] ?End QFDV:[id number] ? ?Venetia Maxon, RN ?

## 2021-10-15 NOTE — Progress Notes (Addendum)
PROGRESS NOTE  [name] PRF:[id number] DOB: 17-Sep-1980 DOA: 09/15/2021 PCP: [facility]   LOS: 30 days   Brief Narrative / Interim history: [name] is a 41 y.o. female with medical history significant for poorly controlled DMT2, ESRD on TTS HD, HTN with poor compliance with medical treatment and HD was sent after completing dialysis session due to staff concern for altered mental status.  She has a recent history of TMA of the right foot about 2 months ago, grew MSSA and was on Ancef with dialysis.  Vascular surgery as well as nephrology consulted.  Hospital course complicated by an episode of lethargy, minimal responsiveness on 2/10 requiring rapid response.  An MRI of the brain showed scattered punctate acute infarcts bilaterally.  Neurology consulted.  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  Neurology recommends Plex, status post HD catheter placed.  Received Plex 3/1, 3/3, 3/4  Subjective / 24h Interval events: Rapid response and myself paged at the bedside due to increased facial swelling and increased shortness of breath.  Patient assessed at bedside, complains of swelling in her eyes, face, and difficulties breathing.  Assesement and Plan: Principal Problem:   Osteomyelitis ([place]) Active Problems:   Anemia of chronic disease   Hyperglycemia due to type 2 diabetes mellitus (HCC)   ESRD on hemodialysis (HCC)   Acute encephalopathy   Cerebral embolism with cerebral infarction   Pressure injury of skin   PFO (patent foramen ovale)   Malnutrition of moderate degree   Ptosis of both eyelids   Assessment and Plan: Principal problem Embolic stroke, with positive bubble study -neurology consulted and following.  An MRI of the brain 2/10 showed scattered punctate acute infarcts throughout both cerebral hemispheres which appeared to be embolic. A transesophageal echo done on 2/20 showed small PFO with  no thrombus.  Completed stroke work-up.  Currently she is on statin, aspirin and Plavix  Active problems Facial swelling-concern for allergic episode versus congestion due to SVC-like syndrome in the setting of chronically occluded IJ and dialysis catheter placed in the EJ.  Received Solu-Medrol, Pepcid, Benadryl, however no clear-cut reason for her to have an allergic reaction since there are no new medications.  Also remove the dialysis catheter.  Case discussed with Dr. Hollie Salk at bedside, also with IR over the phone and they will reassess access for Plex tomorrow.  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  Plan for 5 sessions, already done on 3/1, 3/3, 3/4, 2 sessions remaining neurology following.  Discussed with neurology MD at bedside today  ESRD-nephrology following, continue HD as scheduled.  Most recent HD 3/4  Hypotension-previously on midodrine, now off  Moderate malnutrition-has a core track in place.  Encourage p.o. intake, spouse tells me he has been eating more.  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  Wound looks good  DM2-continue glargine and sliding scale.  No further hypoglycemic episode, keep on same regimen today  CBG (last 3)  Recent Labs    10/15/21 0439 10/15/21 0723 10/15/21 1109  GLUCAP 104* 114* 124*     Scheduled Meds:  aspirin EC  81 mg Oral Daily   atorvastatin  40 mg Oral Daily   Chlorhexidine Gluconate Cloth  6 each Topical Q0600   cinacalcet  90 mg  Oral Q supper   clopidogrel  75 mg Oral Daily   darbepoetin (ARANESP) injection - DIALYSIS  200 mcg Intravenous Q Thu-HD   dorzolamide-timolol  1 drop Left Eye BID   heparin injection (subcutaneous)  5,000 Units Subcutaneous Q8H   insulin aspart  0-6 Units Subcutaneous TID WC   insulin glargine-yfgn  5 Units  Subcutaneous QHS   ipratropium-albuterol  3 mL Nebulization Q6H   lidocaine-prilocaine  1 application Topical UD   mouth rinse  15 mL Mouth Rinse BID   multivitamin  1 tablet Oral QHS   nutrition supplement (JUVEN)  1 packet Oral BID BM   pantoprazole sodium  40 mg Oral Daily   sevelamer carbonate  1.6 g Oral TID WC   thiamine  100 mg Oral Daily   Continuous Infusions:  dextrose 5% lactated ringers 30 mL/hr at 10/14/21 2344   famotidine (PEPCID) IV Stopped (10/15/21 1000)   lactated ringers Stopped (10/12/21 2239)   PRN Meds:.acetaminophen, alteplase, guaiFENesin-dextromethorphan, heparin, heparin, iohexol, lidocaine (PF), lidocaine-prilocaine, ondansetron (ZOFRAN) IV, oxyCODONE-acetaminophen, pentafluoroprop-tetrafluoroeth, phenol  Diet Orders (From admission, onward)     Start     Ordered   10/11/21 1327  Diet renal with fluid restriction Fluid restriction: 1200 mL Fluid; Room service appropriate? Yes; Fluid consistency: Thin  Diet effective now       Question Answer Comment  Fluid restriction: 1200 mL Fluid   Room service appropriate? Yes   Fluid consistency: Thin      10/11/21 1326            DVT prophylaxis: heparin injection 5,000 Units Start: 10/09/21 1400 SCD's Start: 09/17/21 1002   Lab Results  Component Value Date   PLT 286 10/14/2021      Code Status: Full Code  Family Communication: s/o at bedside  Status is: Inpatient  Remains inpatient appropriate because: severity of illness   Level of care: Telemetry Medical  Consultants:  Neurology  Procedures:  2D echo  Microbiology  none  Antimicrobials: none    Objective: Vitals:   10/15/21 0457 10/15/21 0458 10/15/21 0725 10/15/21 0907  BP: 135/80  120/78   Pulse: 93  88   Resp: 12 18    Temp: 98.4 F (36.9 C)  97.7 F (36.5 C)   TempSrc: Oral  Oral   SpO2: 100%  100% 100%  Weight: 60.9 kg     Height:       No intake or output data in the 24 hours ending 10/15/21 1138  Wt  Readings from Last 3 Encounters:  10/15/21 60.9 kg  09/04/21 60.8 kg  08/25/21 61 kg    Examination:  Constitutional: NAD Eyes: Significantly swollen eyelids and swollen face ENMT: mmm Neck: normal, supple Respiratory: clear to auscultation bilaterally, no wheezing, no crackles. Normal respiratory effort.  Cardiovascular: Regular rate and rhythm, no murmurs / rubs / gallops. No LE edema. Abdomen: soft, no distention, no tenderness. Bowel sounds positive.  Skin: no rashes Neurologic: no focal deficits, equal strength  Data Reviewed: I have independently reviewed following labs and imaging studies   CBC Recent Labs  Lab 10/09/21 1204 10/10/21 1418 10/11/21 0950 10/12/21 0805 10/14/21 0218  WBC 9.5 8.8  --  11.9* 16.2*  HGB 8.6* 8.6* 12.2 10.2* 8.8*  HCT 29.1* 29.4* 36.0 35.6* 29.8*  PLT 287 310  --  318 286  MCV 99.0 99.3  --  100.6* 98.0  MCH 29.3 29.1  --  28.8 28.9  MCHC 29.6* 29.3*  --  28.7*  29.5*  RDW 23.9* 23.6*  --  22.5* 20.7*  LYMPHSABS  --   --   --  2.3  --   MONOABS  --   --   --  1.1*  --   EOSABS  --   --   --  0.5  --   BASOSABS  --   --   --  0.1  --      Recent Labs  Lab 10/09/21 1204 10/10/21 1418 10/11/21 0950 10/12/21 0805 10/14/21 0218  NA 133* 128* 132* 135 139  K 5.1 5.5* 4.8 4.8 6.2*  CL 96* 92* 98 96* 101  CO2 27 24  --  27 23  GLUCOSE 223* 184* 69* 77 51*  BUN 84* 110* 47* 56* 43*  CREATININE 6.52* 7.61* 5.20* 5.79* 5.74*  CALCIUM 10.5* 10.7*  --  11.2* 10.5*  AST 20  --   --   --   --   ALT 15  --   --   --   --   ALKPHOS 96  --   --   --   --   BILITOT 0.3  --   --   --   --   ALBUMIN 2.1* 2.1*  --  2.9*  --      ------------------------------------------------------------------------------------------------------------------ No results for input(s): CHOL, HDL, LDLCALC, TRIG, CHOLHDL, LDLDIRECT in the last 72 hours.  Lab Results  Component Value Date   HGBA1C 10.4 (H) 08/11/2021    ------------------------------------------------------------------------------------------------------------------ No results for input(s): TSH, T4TOTAL, T3FREE, THYROIDAB in the last 72 hours.  Invalid input(s): FREET3  Cardiac Enzymes No results for input(s): CKMB, TROPONINI, MYOGLOBIN in the last 168 hours.  Invalid input(s): CK ------------------------------------------------------------------------------------------------------------------ No results found for: BNP  CBG: Recent Labs  Lab 10/14/21 2025 10/14/21 2349 10/15/21 0439 10/15/21 0723 10/15/21 1109  GLUCAP 115* 114* 104* 114* 124*     No results found for this or any previous visit (from the past 240 hour(s)).   Radiology Studies: DG Chest Port 1 View  Result Date: 10/15/2021 CLINICAL DATA:  41 year old female status post removal of vascular catheter. Evaluate for pneumothorax. EXAM: PORTABLE CHEST 1 VIEW COMPARISON:  Chest x-ray 09/22/2021. FINDINGS: A feeding tube is seen extending into the abdomen, however, the tip of the feeding tube extends below the lower margin of the image. Lung volumes are normal. No consolidative airspace disease. No pleural effusions. No pneumothorax. No pulmonary nodule or mass noted. Pulmonary vasculature and the cardiomediastinal silhouette are within normal limits. IMPRESSION: 1. No pneumothorax. No radiographic evidence of acute cardiopulmonary disease. Electronically Signed   By: Vinnie Langton M.D.   On: 10/15/2021 10:03     Marzetta Board, MD, PhD [facility]  Between 7 am - 7 pm I am available, please contact me via Amion (for emergencies) or Securechat (non urgent messages)  Between 7 pm - 7 am I am not available, please contact night coverage MD/APP via Amion

## 2021-10-15 NOTE — Consult Note (Signed)
WOC Nurse Consult Note: ?Reason for Consult:Full thickness, Unstageable pressure injury to sacrum ?Wound type: Pressure ?Pressure Injury POA: No ?Measurement:6cm x 9cm with central area obscured by the presence of yellow adherent nonviable tissue (25%). Seventy-five percent  (75%) pink, moist ?Wound bed:As noted above ?Drainage (amount, consistency, odor) small yellow on old dressing consistent with autolytically debriding nonviable tissue ?Periwound: intact. Scattered healed lesions that are still being protected with foam ?Dressing procedure/placement/frequency: I will add a mattress replacement with low air loss feature to the POC, also an antimicrobial nonadherent  beneath the foam as a wound contact layer.  ? ?[place] nursing team will follow along with you, seeing every 7-10 days and will remain available to this patient, the nursing and medical teams.   ?Thank you, ?Maudie Flakes, MSN, RN, [place], [place], CWON-AP, [place]  ?Pager# [phone]  ? ? ? ?  ?

## 2021-10-16 ENCOUNTER — Inpatient Hospital Stay (HOSPITAL_COMMUNITY): Payer: Medicare Other

## 2021-10-16 DIAGNOSIS — G9340 Encephalopathy, unspecified: Secondary | ICD-10-CM | POA: Diagnosis not present

## 2021-10-16 DIAGNOSIS — M869 Osteomyelitis, unspecified: Secondary | ICD-10-CM | POA: Diagnosis not present

## 2021-10-16 HISTORY — PX: IR FLUORO GUIDE CV LINE LEFT: IMG2282

## 2021-10-16 HISTORY — PX: IR US GUIDE VASC ACCESS LEFT: IMG2389

## 2021-10-16 LAB — RENAL FUNCTION PANEL
Albumin: 2.2 g/dL — ABNORMAL LOW (ref 3.5–5.0)
Anion gap: 11 (ref 5–15)
BUN: 42 mg/dL — ABNORMAL HIGH (ref 6–20)
CO2: 26 mmol/L (ref 22–32)
Calcium: 9.3 mg/dL (ref 8.9–10.3)
Chloride: 97 mmol/L — ABNORMAL LOW (ref 98–111)
Creatinine, Ser: 5.77 mg/dL — ABNORMAL HIGH (ref 0.44–1.00)
GFR, Estimated: 9 mL/min — ABNORMAL LOW (ref >60)
Glucose, Bld: 145 mg/dL — ABNORMAL HIGH (ref 70–99)
Phosphorus: 6.4 mg/dL — ABNORMAL HIGH (ref 2.5–4.6)
Potassium: 4 mmol/L (ref 3.5–5.1)
Sodium: 134 mmol/L — ABNORMAL LOW (ref 135–145)

## 2021-10-16 LAB — CBC
HCT: 28.3 % — ABNORMAL LOW (ref 36.0–46.0)
Hemoglobin: 8.3 g/dL — ABNORMAL LOW (ref 12.0–15.0)
MCH: 28.7 pg (ref 26.0–34.0)
MCHC: 29.3 g/dL — ABNORMAL LOW (ref 30.0–36.0)
MCV: 97.9 fL (ref 80.0–100.0)
Platelets: 354 10*3/uL (ref 150–400)
RBC: 2.89 MIL/uL — ABNORMAL LOW (ref 3.87–5.11)
RDW: 19.7 % — ABNORMAL HIGH (ref 11.5–15.5)
WBC: 12.5 10*3/uL — ABNORMAL HIGH (ref 4.0–10.5)
nRBC: 0.3 % — ABNORMAL HIGH (ref 0.0–0.2)

## 2021-10-16 LAB — GLUCOSE, CAPILLARY
Glucose-Capillary: 101 mg/dL — ABNORMAL HIGH (ref 70–99)
Glucose-Capillary: 103 mg/dL — ABNORMAL HIGH (ref 70–99)
Glucose-Capillary: 109 mg/dL — ABNORMAL HIGH (ref 70–99)
Glucose-Capillary: 111 mg/dL — ABNORMAL HIGH (ref 70–99)
Glucose-Capillary: 140 mg/dL — ABNORMAL HIGH (ref 70–99)
Glucose-Capillary: 143 mg/dL — ABNORMAL HIGH (ref 70–99)
Glucose-Capillary: 44 mg/dL — CL (ref 70–99)

## 2021-10-16 LAB — HEPATITIS B SURFACE ANTIGEN: Hepatitis B Surface Ag: NONREACTIVE

## 2021-10-16 MED ORDER — SODIUM CHLORIDE 0.9 % IV SOLN: 100.0000 mL | INTRAVENOUS | Status: DC | PRN

## 2021-10-16 MED ORDER — LIDOCAINE HCL 1 % IJ SOLN
INTRAMUSCULAR | Status: AC
  Filled 2021-10-16: qty 20

## 2021-10-16 MED ORDER — OXYCODONE-ACETAMINOPHEN 5-325 MG PO TABS
1.0000 | ORAL_TABLET | ORAL | Status: DC | PRN
  Administered 2021-10-16 – 2021-10-20 (×10): 1 via ORAL
  Filled 2021-10-16 (×10): qty 1

## 2021-10-16 MED ORDER — CLOPIDOGREL BISULFATE 75 MG PO TABS
75.0000 mg | ORAL_TABLET | Freq: Every day | ORAL | Status: DC
  Administered 2021-10-17 – 2021-10-20 (×4): 75 mg via ORAL
  Filled 2021-10-16 (×4): qty 1

## 2021-10-16 MED ORDER — NEPRO/CARBSTEADY PO LIQD
660.0000 mL | ORAL | Status: DC
  Administered 2021-10-16: 660 mL
  Filled 2021-10-16: qty 711

## 2021-10-16 MED ORDER — HEPARIN SODIUM (PORCINE) 1000 UNIT/ML IJ SOLN
INTRAMUSCULAR | Status: AC
  Filled 2021-10-16: qty 10

## 2021-10-16 MED ORDER — LIDOCAINE HCL (PF) 1 % IJ SOLN
INTRAMUSCULAR | Status: DC | PRN
  Administered 2021-10-16: 10 mL

## 2021-10-16 NOTE — Progress Notes (Signed)
PT Cancellation Note ? ?Patient Details ?Name: [name] ?MRN: [id number] ?DOB: 12-09-1980 ? ? ?Cancelled Treatment:    Reason Eval/Treat Not Completed: Patient at procedure or test/unavailable- Pt. At HD will check back acutely when appropriate. ? ?Thermon Leyland, SPT ?Acute Rehab Services ? ? ? ?Thermon Leyland ?10/16/2021, 9:25 AM ?

## 2021-10-16 NOTE — Progress Notes (Signed)
Neurology Progress Note ? ?Brief HPI:  ?[name] is a 41 y.o. female with a medical history significant for ESRD on HD T/Th/Sat, type 2 diabetes mellitus with neuropathy, hypertension, right eye blindness, and medical non-adherence who presented to the ED due to lethargy and AMS following HD on 2/3.  ? ?Subjective: ?Patient underwent R AKA 3/1 given lack of healing of R BKA.  ?She was started on PLEX 3/1 with sessions on 3/3 and 3/4. PLEX session 4/5 planned 3/7. ?Patient remains unable to open her eyes on examination.  ? ?Exam: ?Vitals:  ? 10/16/21 0756 10/16/21 1234  ?BP: (!) 107/46 (!) 133/53  ?Pulse: 94 87  ?Resp: 18 19  ?Temp: 98.9 ?F (37.2 ?C) 98.4 ?F (36.9 ?C)  ?SpO2: 97% 100%  ? ?Gen: Laying comfortably in bed, in no acute distress ?Resp: non-labored breathing, no respiratory distress on room air ?Abd: soft, non-tender, non-distended ? ?Neuro: ?Mental status: lethargic, able to follow commands throughout.  ?She is able to answer some simple questions from examiner.  ?Cranial Nerves:  ?Profound ptosis: unable to open eyelids. With passive eye opening, she is able to track examiner's hand in her left ley left visual field. Her gaze is midline and left but she is unable to cross midline towards the right on the left eye. 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  ?Facial diplegia noted. ?Pupils are non-reactive  ?Intact tongue protrusion ?Motor: ?Patient is able to elevate all extremities antigravity without vertical drift or asymmetry.  ?Sensation: facial sensation intact bilaterally ?Gait: Deferred ? ?Assessment: 41 y.o. female with history of ESRD on HD T/Th/Sat, type 2 diabetes mellitus with neuropathy, hypertension, osteomyelitis with BKA, right eye blindness, and medical non-adherence presenting with lethargy and AMS following HD on 2/3. Initial work up in the ED revealed patient with leukocytosis concerning for infection and imaging with concern for osteomyelitis for which vascular surgery recommended  admission for BKA on 2/5 and ongoing antibiotics. Of note, patient was discharged in January following urgent transmetatarsal amputation after being found with gangrenous toes s/p wound vac and IV Ancef after wound cultures grew MSSA. During hospitalization, patient's mental status initially improved and she was responding appropriately on evaluation on 2/4 with surgery for BKA on 2/5. On 2/6, she was noted to be somnolent and slow to respond but she was answering questions appropriately. On 2/7 patient had an episode of hypotension with HD but was noted to still be lethargic but followed commands and answered questions appropriately. Patient again had HD on 2/9 and became more lethargic, patient lethargic on exam during HD on 2/11. MRI brain with scattered acute infarcts throughout both hemispheres. TEE completed- no thrombus detected, small PFO noted. No longer encephalopathic. Inpatient stroke work up completed.    ?  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 She states that this has been ongoing for about the past month. She endorses total loss of vision in her right eye that is and severe loss of vision in her left eye which she estimates has been present for about 1 month as well. She follows all simple commands and answers question appropriately. Her sister reports however that this only started about a week or so ago while she was inhospital. ?Exam confirms severe bilateral ptosis that localizes to the 3rd cranial nerve. Bilateral pupils oblong and 3 mm, non reactive. She has opthalmoplegia, and dysconjugate gaze. ?  Cavernous sinus imaging was negative. ?DDx: Eye opening apraxia is felt to be unlikely. Cranial polyneuropathy including CN III IV VI is possible. Miller-Fisher variant of GBS is felt to be higher on the DDx given negative MRI brain and  orbits (see below). Atypical predominantly bulbar presentation of AIDP is also possible; her BLE weakness and hyporeflexia further support this possibility. Critical illness neuropathy/myopathy would not be expected to present disproportionately with eye involvement but this is also possible. Bulbar myopathy or idiopathic cranial polyneuropathy also on the DDx. Botulism and tick paralysis are unlikely as she has not had any dyspnea; however, the pupil in her left eye has no discernible reactivity, which would be expected for botulism (right eye with prior retinal detachment) and she also has swallowing difficulty requiring pureed diet. An atypical presentation of myasthenia gravis is also possible. NMO is on the DDx given BLE weakness and vision loss.  ?MRI brain  with and without contrast performed yesterday (2/25): Repeat MRI brain with and without contrast (2/25): There is expected evolution of the  infarcts seen on the prior study. No acute infarct. Stable chronic infarcts and chronic microvascular ischemic changes. Scattered chronic microhemorrhages are again noted. Ventricles are stable in size. No intracranial mass or abnormal enhancement to explain the patient's bilateral ptosis and ophthalmoplegia. Cavernous sinus enhancement is symmetric and unremarkable. Persistent left greater than right mastoid fluid opacification. Sella is unremarkable. ?MRI orbits with and without contrast performed yesterday (2/25): Bilateral lens replacements. Chronic right retinal detachment. Extraocular muscles are symmetric and unremarkable. No abnormal enhancement of the optic nerve sheath complexes.  ? ?Impression: Given no cavernous sinus pathology identified on MRI to explain the patient's severe bilateral ptosis and ophthalmoparesis, rare potential etiologies will now need to be considered (in addition to above DDx). 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 Her ophthalmoparesis and possible cognitive impairment on exam (she is poorly cooperative, making determination of cognitive impairment difficult) would make thiamine deficiency more likely. Unfortunately, ataxia cannot be tested as she is noncompliant with this and several other components of the exam. Will need thiamine level followed by empiric thiamine. We were initially planning an LP but given her severe peripheral vascular disease requiring below knee amputation and the fact that she had to be taken to OR again for above knee amputation, we do not want to hold her dual antiplatelet for the LP. ?Other work up for her ptosis and ophthalmoparesis is beyond the scope of this inpatient admission and she will need to be evaluated outpatient by a Neurophthalmologist (Dr. Michel Walker of [facility] is recommended: [phone]). ?  ?Recommendations: ?- Continue PLEX with FFP replacement. 3 of 5 completed and she tolerated it well. Next session to be complete 3/7 ?- Will need outpatient EMG/NCS after discharge. Will need outpatient Neurology follow up with [facility] for this. ?- Consider 30 day cardiac event monitoring as outpatient for the strokes. ?- Follow up with Neuro-opthalmologist (Dr. Michel Walker of [facility] is recommended: [phone]). ? ?[name], AGACNP-BC ?[facility] ?[phone] ? ? ?Attending Neurohospitalist Addendum ?Patient seen and examined with APP/Resident. ?Agree with the history and physical as documented above. ?Agree with the plan as documented, which I helped formulate. ?I have independently reviewed the chart, obtained history, review of systems and examined the patient.I have personally reviewed pertinent head/neck/spine imaging (CT/MRI). ?Please feel free to call with any questions. ? ?-- ?Tracey Portland, MD ?Neurologist ?[facility] ?Pager: [phone] ? ? ?

## 2021-10-16 NOTE — Progress Notes (Addendum)
[facility]  ? ?Subjective  - awake with eyes closed ? ? ?Objective ?(!) 100/56 ?93 ?98.9 ?F (37.2 ?C) (Oral) ?18 ?98% ? ?Intake/Output Summary (Last 24 hours) at 10/16/2021 0727 ?Last data filed at 10/15/2021 1950 ?Gross per 24 hour  ?Intake 30 ml  ?Output --  ?Net 30 ml  ? ? ?Right AKA healing well appears viable, retention sock in place ?No drainage or erythema right AKA ?Right UE AV fistula with palpable thrill ? ?Assessment/Planning: ?41 y.o. female is s/p right above knee amputation  5 Days Post-Op ? ?Viable right AKA staples will remain for 4 weeks total ?IR  for [facility] today for PLEX with FFP, working right UE av fistula. ?Stable from a vascular point of view will follow 2 times a week. ? ?Roxy Horseman ?10/16/2021 ?7:27 AM ?-- ? ?Laboratory ?Lab Results: ?Recent Labs  ?  10/14/21 ?0218  ?WBC 16.2*  ?HGB 8.8*  ?HCT 29.8*  ?PLT 286  ? ?BMET ?Recent Labs  ?  10/14/21 ?0218  ?NA 139  ?K 6.2*  ?CL 101  ?CO2 23  ?GLUCOSE 51*  ?BUN 43*  ?CREATININE 5.74*  ?CALCIUM 10.5*  ? ? ?COAG ?Lab Results  ?Component Value Date  ? INR 1.2 08/10/2021  ? INR 1.1 06/30/2020  ? ?No results found for: PTT ? ? ? ?

## 2021-10-16 NOTE — Progress Notes (Signed)
PROGRESS NOTE  Via Rosado EQA:[id number] DOB: 02-Feb-1981 DOA: 09/15/2021 PCP: [facility]   LOS: 31 days   Brief Narrative / Interim history: [name] is a 41 y.o. female with medical history significant for poorly controlled DMT2, ESRD on TTS HD, HTN with poor compliance with medical treatment and HD was sent after completing dialysis session due to staff concern for altered mental status.  She has a recent history of TMA of the right foot about 2 months ago, grew MSSA and was on Ancef with dialysis.  Vascular surgery as well as nephrology consulted.  Hospital course complicated by an episode of lethargy, minimal responsiveness on 2/10 requiring rapid response.  An MRI of the brain showed scattered punctate acute infarcts bilaterally.  Neurology consulted.  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  Neurology recommends Plex, status post HD catheter placed.  Received Plex 3/1, 3/3, 3/4  Subjective / 24h Interval events: Doing better today.  Facial swelling is gone down significantly.  Has no complaints but tells me she still cannot open her eyes  Assesement and Plan: Principal Problem:   Osteomyelitis ([place]) Active Problems:   Anemia of chronic disease   Hyperglycemia due to type 2 diabetes mellitus (HCC)   ESRD on hemodialysis ([place])   Acute encephalopathy   Cerebral embolism with cerebral infarction   Pressure injury of skin   PFO (patent foramen ovale)   Malnutrition of moderate degree   Ptosis of both eyelids   Assessment and Plan: Principal problem Embolic stroke, with positive bubble study -neurology consulted and following.  An MRI of the brain 2/10 showed scattered punctate acute infarcts throughout both cerebral hemispheres which appeared to be embolic. A transesophageal echo done on 2/20 showed small PFO with no thrombus.  Completed stroke work-up.  Currently she is on statin, aspirin and  Plavix  Active problems Facial swelling on 3/5-concern for allergic episode versus congestion due to SVC-like syndrome in the setting of chronically occluded IJ and dialysis catheter placed in the EJ.  Received Solu-Medrol, Pepcid, Benadryl, however no clear-cut reason for her to have an allergic reaction since there are no new medications.  Dialysis catheter removed, significant improvement today.  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  Plan for 5 sessions, already done on 3/1, 3/3, 3/4, 2 sessions remaining neurology following.  Since EJ catheter removed, will have a femoral line placed today to complete the Plex  ESRD-nephrology following, continue HD as scheduled.  Most recent HD 3/4  Hypotension-previously on midodrine, now off  Moderate malnutrition-has a core track in place.  Encourage p.o. intake, spouse tells me he has been eating more.  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  Wound looks good  DM2-continue glargine and sliding scale.  No further hypoglycemia, continue current regimen  CBG (last 3)  Recent Labs    10/15/21 2333 10/16/21 0337 10/16/21 0758  GLUCAP 201* 143* 103*     Scheduled Meds:  aspirin EC  81 mg Oral Daily   atorvastatin  40 mg Oral Daily   Chlorhexidine Gluconate Cloth  6 each Topical Q0600   cinacalcet  90 mg Oral Q supper   [START ON 10/17/2021] clopidogrel  75 mg Oral Daily   darbepoetin (ARANESP) injection - DIALYSIS  200 mcg  Intravenous Q Thu-HD   dorzolamide-timolol  1 drop Left Eye BID   heparin injection (subcutaneous)  5,000 Units Subcutaneous Q8H   heparin sodium (porcine)       insulin aspart  0-6 Units Subcutaneous TID WC   insulin glargine-yfgn  5 Units Subcutaneous QHS   ipratropium-albuterol  3 mL Nebulization TID   lidocaine        lidocaine-prilocaine  1 application. Topical UD   mouth rinse  15 mL Mouth Rinse BID   multivitamin  1 tablet Oral QHS   nutrition supplement (JUVEN)  1 packet Oral BID BM   pantoprazole sodium  40 mg Oral Daily   sevelamer carbonate  1.6 g Oral TID WC   thiamine  100 mg Oral Daily   Continuous Infusions:  dextrose 5% lactated ringers 30 mL/hr at 10/14/21 2344   lactated ringers Stopped (10/12/21 2239)   PRN Meds:.acetaminophen, alteplase, guaiFENesin-dextromethorphan, heparin, heparin, iohexol, lidocaine (PF), lidocaine (PF), lidocaine-prilocaine, ondansetron (ZOFRAN) IV, oxyCODONE-acetaminophen, pentafluoroprop-tetrafluoroeth, phenol  Diet Orders (From admission, onward)     Start     Ordered   10/16/21 0643  Diet NPO time specified  Diet effective now        10/16/21 1610            DVT prophylaxis: heparin injection 5,000 Units Start: 10/09/21 1400 SCD's Start: 09/17/21 1002   Lab Results  Component Value Date   PLT 286 10/14/2021      Code Status: Full Code  Family Communication: s/o at bedside  Status is: Inpatient  Remains inpatient appropriate because: severity of illness   Level of care: Telemetry Medical  Consultants:  Neurology  Procedures:  2D echo  Microbiology  none  Antimicrobials: none    Objective: Vitals:   10/15/21 2121 10/16/21 0500 10/16/21 0643 10/16/21 0756  BP: 101/65  (!) 100/56 (!) 107/46  Pulse: 97  93 94  Resp: 18  18 18   Temp: 98.7 F (37.1 C)  98.9 F (37.2 C) 98.9 F (37.2 C)  TempSrc: Oral  Oral Oral  SpO2: 97%  98% 97%  Weight:  59.8 kg    Height:        Intake/Output Summary (Last 24 hours) at 10/16/2021 1020 Last data filed at 10/15/2021 1950 Gross per 24 hour  Intake 30 ml  Output --  Net 30 ml    Wt Readings from Last 3 Encounters:  10/16/21 59.8 kg  09/04/21 60.8 kg  08/25/21 61 kg    Examination:  Constitutional: NAD Eyes: lids and conjunctivae normal, no scleral icterus, no significant  swelling ENMT: mmm Neck: normal, supple Respiratory: clear to auscultation bilaterally, no wheezing, no crackles. Normal respiratory effort.  Cardiovascular: Regular rate and rhythm, no murmurs / rubs / gallops. No LE edema. Abdomen: soft, no distention, no tenderness. Bowel sounds positive.  Skin: no rashes Neurologic: no focal deficits, equal strength  Data Reviewed: I have independently reviewed following labs and imaging studies   CBC Recent Labs  Lab 10/09/21 1204 10/10/21 1418 10/11/21 0950 10/12/21 0805 10/14/21 0218  WBC 9.5 8.8  --  11.9* 16.2*  HGB 8.6* 8.6* 12.2 10.2* 8.8*  HCT 29.1* 29.4* 36.0 35.6* 29.8*  PLT 287 310  --  318 286  MCV 99.0 99.3  --  100.6* 98.0  MCH 29.3 29.1  --  28.8 28.9  MCHC 29.6* 29.3*  --  28.7* 29.5*  RDW 23.9* 23.6*  --  22.5* 20.7*  LYMPHSABS  --   --   --  2.3  --   MONOABS  --   --   --  1.1*  --   EOSABS  --   --   --  0.5  --   BASOSABS  --   --   --  0.1  --      Recent Labs  Lab 10/09/21 1204 10/10/21 1418 10/11/21 0950 10/12/21 0805 10/14/21 0218  NA 133* 128* 132* 135 139  K 5.1 5.5* 4.8 4.8 6.2*  CL 96* 92* 98 96* 101  CO2 27 24  --  27 23  GLUCOSE 223* 184* 69* 77 51*  BUN 84* 110* 47* 56* 43*  CREATININE 6.52* 7.61* 5.20* 5.79* 5.74*  CALCIUM 10.5* 10.7*  --  11.2* 10.5*  AST 20  --   --   --   --   ALT 15  --   --   --   --   ALKPHOS 96  --   --   --   --   BILITOT 0.3  --   --   --   --   ALBUMIN 2.1* 2.1*  --  2.9*  --      ------------------------------------------------------------------------------------------------------------------ No results for input(s): CHOL, HDL, LDLCALC, TRIG, CHOLHDL, LDLDIRECT in the last 72 hours.  Lab Results  Component Value Date   HGBA1C 10.4 (H) 08/11/2021   ------------------------------------------------------------------------------------------------------------------ No results for input(s): TSH, T4TOTAL, T3FREE, THYROIDAB in the last 72 hours.  Invalid  input(s): FREET3  Cardiac Enzymes No results for input(s): CKMB, TROPONINI, MYOGLOBIN in the last 168 hours.  Invalid input(s): CK ------------------------------------------------------------------------------------------------------------------ No results found for: BNP  CBG: Recent Labs  Lab 10/15/21 1642 10/15/21 2025 10/15/21 2333 10/16/21 0337 10/16/21 0758  GLUCAP 172* 197* 201* 143* 103*     No results found for this or any previous visit (from the past 240 hour(s)).   Radiology Studies: No results found.   Marzetta Board, MD, PhD [facility]  Between 7 am - 7 pm I am available, please contact me via Amion (for emergencies) or Securechat (non urgent messages)  Between 7 pm - 7 am I am not available, please contact night coverage MD/APP via Amion

## 2021-10-16 NOTE — Procedures (Signed)
?  Procedure: L IJ Trialysis HD CVC 20cm to mid RA ?EBL:   minimal ?Complications:  none immediate ? ?See full dictation in [facility]. ? ?D. Arne Cleveland MD ?Main # [phone] ?Pager  [phone] ?Mobile [phone] ?  ? ?

## 2021-10-16 NOTE — Progress Notes (Signed)
Brief Nutrition Note ? ?Consult received for enteral/tube feeding initiation and management as pt still with Cortrak in place and PO intake has again been inadequate to meet needs.  ? ?Adult Enteral Nutrition Protocol initiated utilizing pt's previous TF orders -- Nepro @ 55 ml/hr x 12 hours from 1800 to 0600 (total of 660 ml) ?  ?Nocturnal tube feeding regimen will provide 1188 kcal, 53 grams of protein, and 480 ml of H2O (meets 66% of kcal needs and 59% of protein needs).Full assessment to follow. ? ?Admitting Dx: Osteomyelitis ([place]) [M86.9] ?Osteomyelitis of right foot, unspecified type ([place]) [M86.9] ?Wound of foot [S91.309A] ? ?Body mass index is 24.87 kg/m?Marland Kitchen Pt meets criteria for normal based on current BMI. ? ?Labs: ? ?Recent Labs  ?Lab 10/10/21 ?1418 10/11/21 ?0950 10/12/21 ?0805 10/14/21 ?1607 10/16/21 ?1406  ?NA 128*   < > 135 139 134*  ?K 5.5*   < > 4.8 6.2* 4.0  ?CL 92*   < > 96* 101 97*  ?CO2 24  --  27 23 26   ?BUN 110*   < > 56* 43* 42*  ?CREATININE 7.61*   < > 5.79* 5.74* 5.77*  ?CALCIUM 10.7*  --  11.2* 10.5* 9.3  ?PHOS 3.9  --  5.8*  --  6.4*  ?GLUCOSE 184*   < > 77 51* 145*  ? < > = values in this interval not displayed.  ? ? ? ?Theone Stanley., MS, RD, LDN (she/her/hers) ?RD pager number and weekend/on-call pager number located in [place]. ? ? ?

## 2021-10-16 NOTE — Clinical Note (Incomplete)
Inpatient Rehab Admissions Coordinator:  ? ?I continue to foll ?

## 2021-10-16 NOTE — Progress Notes (Signed)
?[facility] ?Progress Note  ? ?Subjective:    ?Seen and examined at bedside. No complaints. Facial swelling significantly improved. S/p L IJ trialysis HD cath for in IR by Dr. Vernard Gambles. Spoke to HD staff, PLEX scheduled for tomorrow; thus, plan for HD this afternoon/early evening off schedule. UF as tolerated. ? ?Objective ?Vitals:  ? 10/16/21 0500 10/16/21 0643 10/16/21 0756 10/16/21 1234  ?BP:  (!) 100/56 (!) 107/46 (!) 133/53  ?Pulse:  93 94 87  ?Resp:  18 18 19   ?Temp:  98.9 ?F (37.2 ?C) 98.9 ?F (37.2 ?C) 98.4 ?F (36.9 ?C)  ?TempSrc:  Oral Oral Oral  ?SpO2:  98% 97% 100%  ?Weight: 59.8 kg     ?Height:      ? ?Physical Exam ?General: Chronically ill-appearing young female NAD, eyes closed ?HEENT: Facial swelling significantly improved ?Heart: RRR, no murmurs, gallops, or rubs ?Lungs: CTA nonlabored breathing ?Abdomen: NABS, S, NT ND ?Extremities: No lower extremity edema/right AKA dressing dry clear  ?dialysis Access: RUA AVF positive bruit, s/p left  IJ trialysis HD cath placed today ? ?Filed Weights  ? 10/14/21 1126 10/15/21 0457 10/16/21 0500  ?Weight: 58.4 kg 60.9 kg 59.8 kg  ? ? ?Intake/Output Summary (Last 24 hours) at 10/16/2021 1257 ?Last data filed at 10/15/2021 1950 ?Gross per 24 hour  ?Intake 30 ml  ?Output --  ?Net 30 ml  ? ? ?Additional Objective ?Labs: ?Basic Metabolic Panel: ?Recent Labs  ?Lab 10/10/21 ?1418 10/11/21 ?0950 10/12/21 ?0805 10/14/21 ?0932  ?NA 128* 132* 135 139  ?K 5.5* 4.8 4.8 6.2*  ?CL 92* 98 96* 101  ?CO2 24  --  27 23  ?GLUCOSE 184* 69* 77 51*  ?BUN 110* 47* 56* 43*  ?CREATININE 7.61* 5.20* 5.79* 5.74*  ?CALCIUM 10.7*  --  11.2* 10.5*  ?PHOS 3.9  --  5.8*  --   ? ?Liver Function Tests: ?Recent Labs  ?Lab 10/10/21 ?1418 10/12/21 ?0805  ?ALBUMIN 2.1* 2.9*  ? ?No results for input(s): LIPASE, AMYLASE in the last 168 hours. ?CBC: ?Recent Labs  ?Lab 10/10/21 ?1418 10/11/21 ?0950 10/12/21 ?0805 10/14/21 ?3557  ?WBC 8.8  --  11.9* 16.2*  ?NEUTROABS  --   --  7.9*  --   ?HGB  8.6* 12.2 10.2* 8.8*  ?HCT 29.4* 36.0 35.6* 29.8*  ?MCV 99.3  --  100.6* 98.0  ?PLT 310  --  318 286  ? ?Blood Culture ?   ?Component Value Date/Time  ? SDES BLOOD LEFT HAND 09/22/2021 1554  ? SPECREQUEST  09/22/2021 1554  ?  BOTTLES DRAWN AEROBIC AND ANAEROBIC Blood Culture results may not be optimal due to an inadequate volume of blood received in culture bottles  ? CULT  09/22/2021 1554  ?  NO GROWTH 5 DAYS ?Performed at [facility], [place] [address], [place], [place] [address] ?  ? REPTSTATUS 09/27/2021 FINAL 09/22/2021 1554  ? ? ?Cardiac Enzymes: ?No results for input(s): CKTOTAL, CKMB, CKMBINDEX, TROPONINI in the last 168 hours. ?CBG: ?Recent Labs  ?Lab 10/15/21 ?2025 10/15/21 ?2333 10/16/21 ?5427 10/16/21 ?0623 10/16/21 ?1114  ?GLUCAP 197* 201* 143* 103* 109*  ? ?Iron Studies: No results for input(s): IRON, TIBC, TRANSFERRIN, FERRITIN in the last 72 hours. ?Lab Results  ?Component Value Date  ? INR 1.2 08/10/2021  ? INR 1.1 06/30/2020  ? ?Studies/Results: ?DG Chest Port 1 View ? ?Result Date: 10/15/2021 ?CLINICAL DATA:  41 year old female status post removal of vascular catheter. Evaluate for pneumothorax. EXAM: PORTABLE CHEST 1 VIEW  COMPARISON:  Chest x-ray 09/22/2021. FINDINGS: A feeding tube is seen extending into the abdomen, however, the tip of the feeding tube extends below the lower margin of the image. Lung volumes are normal. No consolidative airspace disease. No pleural effusions. No pneumothorax. No pulmonary nodule or mass noted. Pulmonary vasculature and the cardiomediastinal silhouette are within normal limits. IMPRESSION: 1. No pneumothorax. No radiographic evidence of acute cardiopulmonary disease. Electronically Signed   By: Vinnie Langton M.D.   On: 10/15/2021 10:03   ? ?Medications: ? sodium chloride    ? sodium chloride    ? dextrose 5% lactated ringers 30 mL/hr at 10/14/21 2344  ? lactated ringers Stopped (10/12/21 2239)  ? ? aspirin EC  81 mg Oral Daily  ? atorvastatin  40 mg Oral  Daily  ? Chlorhexidine Gluconate Cloth  6 each Topical Q0600  ? cinacalcet  90 mg Oral Q supper  ? [START ON 10/17/2021] clopidogrel  75 mg Oral Daily  ? darbepoetin (ARANESP) injection - DIALYSIS  200 mcg Intravenous Q Thu-HD  ? dorzolamide-timolol  1 drop Left Eye BID  ? heparin injection (subcutaneous)  5,000 Units Subcutaneous Q8H  ? heparin sodium (porcine)      ? insulin aspart  0-6 Units Subcutaneous TID WC  ? insulin glargine-yfgn  5 Units Subcutaneous QHS  ? ipratropium-albuterol  3 mL Nebulization TID  ? lidocaine      ? lidocaine-prilocaine  1 application. Topical UD  ? mouth rinse  15 mL Mouth Rinse BID  ? multivitamin  1 tablet Oral QHS  ? nutrition supplement (JUVEN)  1 packet Oral BID BM  ? pantoprazole sodium  40 mg Oral Daily  ? sevelamer carbonate  1.6 g Oral TID WC  ? thiamine  100 mg Oral Daily  ? ? ?Dialysis Orders: ?[facility] ?3h 61min   400/ 600   59.5kg   2/2 bath  Prof 2   RU AV fistula  Hep 4000  ?Mircera 200 mcg IV every 2 weeks - last 1/26 ?Ancef 2g 3 times weekly HD ?Covid neg 2/3, hep B SAg neg 2/4 ? ?Assessment/Plan: ?Embolic stroke with AMS -neuro following, acute embolic CVA / Repeat MRI with no acute findings.  With PFO on TTE. TEE showed small PFO and no clot. Blood Cx 2/4 negative. S/p course of IV abx.  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 Scheduled for next PLEX session 3/7. 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 Plan for LP after 5 days off of ASA and Plavix. Getting PLEX QOD.  R EJ nontunneled HD cath removed in setting marked facial edema and concern for possible SVC syndrome. Please see #12 for alternative access. 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 Trula Slade [facility] following.  To go to CIR at some point.  ?ESRD - Usually TTS schedule - Currently off schedule. Plan for HD today d/t PLEX for tomorrow. Trying to keep SBP > 100 on HD due to #1. Wants to  continue HD at this time. Continue to monitor behavior during HD and tolerate dialysis in chair prior to d/c. ?HTN/volume - noted episodes of hypotension during HD,  improved with fluid /midodrine with HD. Below prior EDW, will need to be lowered on d/c. ?Sepsis/ shock - resolved  ?Anemia of CKD- last Hgb stable 8.8  continue Aranesp 230mcg q Thurs. Transfuse pRBC prn.  ?MBD: CorrCa  high, Phos ok. Continue home sensipar/binders, hold VDRA for now.   ?Severe protein calorie malnutrition: NG tube in place with Nepro TF's + supplements. ?DMT2 - per PMD  ?Access: LUE AVF with some eschar- sticking away from now, monitor closely ?Facial edema- AM of 10/15/21--> ? Delayed reaction to PLEX with FFP vs SVC- Mar reviewed, no new meds. Received Solumedrol/ benadryl/ famotidine, R EJ catheter removed.  D/w hospitalist.  Since pt has had her AKA and no bleeding > 48 hrs out, OK to convert PLEX to albumin replacement fluid. S/p L IJ trialysis HD cath in IR today by Dr. Vernard Gambles ?Dispo - likely will need SNF placement/versus CIR ? ?Tobie Poet, NP ?[facility] ?10/16/2021,12:57 PM ? LOS: 31 days  ?  ?

## 2021-10-16 NOTE — Progress Notes (Addendum)
Speech Language Pathology Treatment: Dysphagia  ?Patient Details ?Name: [name] ?MRN: [id number] ?DOB: 1980/10/13 ?Today's Date: 10/16/2021 ?Time: 9983-3825 ?SLP Time Calculation (min) (ACUTE ONLY): 15 min ? ?Assessment / Plan / Recommendation ?Clinical Impression ? Pt on ST caseload and planning to see today. ST reordered due to acute neck edema and pt stating she is having a hard time swallowing. Last visit this therapist had appreciated some edema around her neck area as well. She consumed tsp and straw sips thin and one bite applesauce with facial grimace, and reporting significant odonophagia and minimal crying after swallow stating "it hurts". Vocal quality was slightly wet x 1 but appeared to clear.   ?Would be best if trays are not ordered for her and tube feeds are restarted for a day or so- discussed with RD. If pt feels she can and wants to try sips liquid and or applesauce she may do so. ST will follow. ?  ?HPI HPI: 41 y.o. female presented to ED 2/3 with AMS, gangrene RLE and osteomyelitis.  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 Change in mentation 2/10; MRI brain showed scattered punctate acute infarcts throughout both cerebral hemispheres. Made NPO 2/11. Per chart suspicion for [name] given facial diplegia, ptosis, unreactive pupil.  Neurology recommends Plex, status post HD catheter placed.  Received Plex 3/1, 3/3, 3/4. 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 Palliative care has been consulted in the setting of multiple acute on chronic disease processes to further discuss goals of care. Swallow eval ordered 2/13. ?  ?   ?SLP Plan ? Continue with current plan of care ? ?  ?  ?Recommendations for follow up therapy are one component of a multi-disciplinary discharge planning process, led by the attending physician.  Recommendations may be updated  based on patient status, additional functional criteria and insurance authorization. ?  ? ?Recommendations  ?Diet recommendations: Other(comment) (thin liquids if pt desires to attempt) ?Liquids provided via: Straw ?Medication Administration: Via alternative means ?Supervision: Full supervision/cueing for compensatory strategies;Staff to assist with self feeding ?Compensations: Minimize environmental distractions;Slow rate;Small sips/bites;Follow solids with liquid ?Postural Changes and/or Swallow Maneuvers: Seated upright 90 degrees  ?   ?    ?   ? ? ? ? Oral Care Recommendations: Oral care BID ?Follow Up Recommendations:  (TBD) ?Assistance recommended at discharge: Frequent or constant Supervision/Assistance ?SLP Visit Diagnosis: Dysphagia, unspecified (R13.10);Dysphagia, oral phase (R13.11) ?Plan: Continue with current plan of care ? ? ? ? ?  ?  ? ? ?[name] ? ?10/16/2021, 10:04 AM ?

## 2021-10-16 NOTE — Progress Notes (Signed)
removed 670 mls net fluid.  pre bp 91/25 post bp 95/62 pre weight 59.7kg  post weight 58.5kg bed scales.  2 bandages to right forearm av fistula no bleeding dressing cdi. gave 5 mg percocet for pain,  called primary doctor whom changed order to q 4 instead of q 8 for percocet. hypotension throughout treatment.  without symptom. ?

## 2021-10-17 DIAGNOSIS — M869 Osteomyelitis, unspecified: Secondary | ICD-10-CM | POA: Diagnosis not present

## 2021-10-17 LAB — GLUCOSE, CAPILLARY
Glucose-Capillary: 154 mg/dL — ABNORMAL HIGH (ref 70–99)
Glucose-Capillary: 188 mg/dL — ABNORMAL HIGH (ref 70–99)
Glucose-Capillary: 189 mg/dL — ABNORMAL HIGH (ref 70–99)
Glucose-Capillary: 195 mg/dL — ABNORMAL HIGH (ref 70–99)
Glucose-Capillary: 196 mg/dL — ABNORMAL HIGH (ref 70–99)
Glucose-Capillary: 242 mg/dL — ABNORMAL HIGH (ref 70–99)
Glucose-Capillary: 280 mg/dL — ABNORMAL HIGH (ref 70–99)

## 2021-10-17 LAB — MISC LABCORP TEST (SEND OUT): Labcorp test code: 9985

## 2021-10-17 MED ORDER — SODIUM CHLORIDE 0.9 % IV SOLN: 100.0000 mL | INTRAVENOUS | Status: DC | PRN

## 2021-10-17 MED ORDER — ANTICOAGULANT SODIUM CITRATE 4% (200MG/5ML) IV SOLN
5.0000 mL | Freq: Once | Status: AC
  Administered 2021-10-17: 5 mL
  Filled 2021-10-17 (×2): qty 5

## 2021-10-17 MED ORDER — ACD FORMULA A 0.73-2.45-2.2 GM/100ML VI SOLN
1000.0000 mL | Status: DC
  Administered 2021-10-17: 1000 mL
  Filled 2021-10-17: qty 1000

## 2021-10-17 MED ORDER — INSULIN ASPART 100 UNIT/ML IJ SOLN
0.0000 [IU] | INTRAMUSCULAR | Status: DC
  Administered 2021-10-17: 2 [IU] via SUBCUTANEOUS
  Administered 2021-10-17 (×2): 1 [IU] via SUBCUTANEOUS
  Administered 2021-10-17: 3 [IU] via SUBCUTANEOUS
  Administered 2021-10-19: 05:00:00 2 [IU] via SUBCUTANEOUS
  Administered 2021-10-19 (×2): 1 [IU] via SUBCUTANEOUS
  Administered 2021-10-19: 22:00:00 2 [IU] via SUBCUTANEOUS
  Administered 2021-10-19: 09:00:00 1 [IU] via SUBCUTANEOUS

## 2021-10-17 MED ORDER — JUVEN PO PACK
1.0000 | PACK | Freq: Two times a day (BID) | ORAL | Status: DC
Start: 2021-10-17 — End: 2021-10-25
  Administered 2021-10-17 – 2021-10-25 (×9): 1
  Filled 2021-10-17 (×13): qty 1

## 2021-10-17 MED ORDER — NEPRO/CARBSTEADY PO LIQD
960.0000 mL | ORAL | Status: DC
  Administered 2021-10-17 – 2021-10-25 (×8): 960 mL
  Filled 2021-10-17 (×11): qty 1000

## 2021-10-17 MED ORDER — SENNOSIDES-DOCUSATE SODIUM 8.6-50 MG PO TABS
1.0000 | ORAL_TABLET | Freq: Two times a day (BID) | ORAL | Status: DC
  Administered 2021-10-17 – 2021-10-19 (×6): 1 via ORAL
  Filled 2021-10-17 (×8): qty 1

## 2021-10-17 MED ORDER — PROSOURCE TF PO LIQD
45.0000 mL | Freq: Two times a day (BID) | ORAL | Status: DC
  Administered 2021-10-17 – 2021-10-25 (×13): 45 mL
  Filled 2021-10-17 (×14): qty 45

## 2021-10-17 MED ORDER — CALCIUM GLUCONATE-NACL 2-0.675 GM/100ML-% IV SOLN
INTRAVENOUS | Status: AC
  Administered 2021-10-17: 2000 mg
  Filled 2021-10-17: qty 200

## 2021-10-17 MED ORDER — CALCIUM GLUCONATE-NACL 2-0.675 GM/100ML-% IV SOLN: 2.0000 g | Freq: Once | INTRAVENOUS | Status: AC

## 2021-10-17 MED ORDER — ACETAMINOPHEN 325 MG PO TABS: 650.0000 mg | ORAL_TABLET | ORAL | Status: DC | PRN

## 2021-10-17 MED ORDER — ACD FORMULA A 0.73-2.45-2.2 GM/100ML VI SOLN
Status: AC
  Filled 2021-10-17: qty 1000

## 2021-10-17 MED ORDER — DIPHENHYDRAMINE HCL 25 MG PO CAPS: 25.0000 mg | ORAL_CAPSULE | Freq: Four times a day (QID) | ORAL | Status: DC | PRN

## 2021-10-17 MED ORDER — IPRATROPIUM-ALBUTEROL 0.5-2.5 (3) MG/3ML IN SOLN
3.0000 mL | Freq: Four times a day (QID) | RESPIRATORY_TRACT | Status: DC | PRN
  Administered 2021-10-18 (×2): 3 mL via RESPIRATORY_TRACT
  Filled 2021-10-17 (×2): qty 3

## 2021-10-17 NOTE — Progress Notes (Signed)
[facility] Progress Note   Subjective:    Seen and examined patient. Currently receiving PLEX at bedside (4th treatment). She denies SOB and CP. Tolerated yesterday's HD with minimal UF-noted some hypotensive episodes. Plan for HD tomorrow 10/18/21.  Objective Vitals:   10/17/21 1502 10/17/21 1512 10/17/21 1527 10/17/21 1535  BP: (!) 120/50 (!) 120/52 (!) 120/48 110/60  Pulse: 98 96 90 100  Resp: 20 20 18 18   Temp: 98.4 F (36.9 C) 98.6 F (37 C) 98.4 F (36.9 C) 98.3 F (36.8 C)  TempSrc: Oral Oral    SpO2:      Weight:      Height:       Physical Exam General: Chronically ill-appearing young female NAD, eyes closed HEENT: Facial swelling significantly improved Heart: RRR, no murmurs, gallops, or rubs Lungs: CTA nonlabored breathing Abdomen: NABS, S, NT ND Extremities: No lower extremity edema/right AKA dressing dry clear  dialysis  Vascular Access: RUA AVF positive bruit, s/p left  IJ trialysis HD cath placed 3/6  Filed Weights   10/16/21 1309 10/16/21 1659 10/17/21 1447  Weight: 59.7 kg 58.4 kg 58 kg    Intake/Output Summary (Last 24 hours) at 10/17/2021 1546 Last data filed at 10/17/2021 0600 Gross per 24 hour  Intake 537.17 ml  Output 670 ml  Net -132.83 ml    Additional Objective Labs: Basic Metabolic Panel: Recent Labs  Lab 10/12/21 0805 10/14/21 0218 10/16/21 1406  NA 135 139 134*  K 4.8 6.2* 4.0  CL 96* 101 97*  CO2 27 23 26   GLUCOSE 77 51* 145*  BUN 56* 43* 42*  CREATININE 5.79* 5.74* 5.77*  CALCIUM 11.2* 10.5* 9.3  PHOS 5.8*  --  6.4*   Liver Function Tests: Recent Labs  Lab 10/12/21 0805 10/16/21 1406  ALBUMIN 2.9* 2.2*   No results for input(s): LIPASE, AMYLASE in the last 168 hours. CBC: Recent Labs  Lab 10/12/21 0805 10/14/21 0218 10/16/21 1407  WBC 11.9* 16.2* 12.5*  NEUTROABS 7.9*  --   --   HGB 10.2* 8.8* 8.3*  HCT 35.6* 29.8* 28.3*  MCV 100.6* 98.0 97.9  PLT 318 286 354   Blood Culture    Component  Value Date/Time   SDES BLOOD LEFT HAND 09/22/2021 1554   SPECREQUEST  09/22/2021 1554    BOTTLES DRAWN AEROBIC AND ANAEROBIC Blood Culture results may not be optimal due to an inadequate volume of blood received in culture bottles   CULT  09/22/2021 1554    NO GROWTH 5 DAYS Performed at [facility], [place] [address], [place], [place] [address]    REPTSTATUS 09/27/2021 FINAL 09/22/2021 1554    Cardiac Enzymes: No results for input(s): CKTOTAL, CKMB, CKMBINDEX, TROPONINI in the last 168 hours. CBG: Recent Labs  Lab 10/16/21 2010 10/16/21 2343 10/17/21 0430 10/17/21 0750 10/17/21 1210  GLUCAP 111* 140* 154* 242* 280*   Iron Studies: No results for input(s): IRON, TIBC, TRANSFERRIN, FERRITIN in the last 72 hours. Lab Results  Component Value Date   INR 1.2 08/10/2021   INR 1.1 06/30/2020   Studies/Results: IR Fluoro Guide CV Line Left  Result Date: 10/16/2021 CLINICAL DATA:  Recent removal of right IJ catheter. Needs access for hemodialysis EXAM: EXAM LEFT IJ CATHETER PLACEMENT UNDER ULTRASOUND AND FLUOROSCOPIC GUIDANCE TECHNIQUE: The procedure, risks (including but not limited to bleeding, infection, organ damage, pneumothorax), benefits, and alternatives were explained to the patient. Questions regarding the procedure were encouraged and answered. The patient understands and consents  to the procedure. Patency of the LEFT IJ vein was confirmed with ultrasound with image documentation. An appropriate skin site was determined. Skin site was marked. Region was prepped using maximum barrier technique including cap and mask, sterile gown, sterile gloves, large sterile sheet, and Chlorhexidine as cutaneous antisepsis. The region was infiltrated locally with 1% lidocaine. Under real-time ultrasound guidance, the LEFT IJ vein was accessed with a 21 gauge needle; the needle tip within the vein was confirmed with ultrasound image documentation. The needle exchanged over a guidewire for  vascular dilator which allowed advancement of a 20 cm Trialysis catheter. This was positioned with the in the mid right atrium. Spot chest radiograph shows good positioning and no pneumothorax. Catheter was flushed and sutured externally with 0-Prolene sutures. Patient tolerated the procedure well. FLUOROSCOPY: Radiation Exposure Index (as provided by the fluoroscopic device): 1 mGy air Kerma COMPLICATIONS: COMPLICATIONS none IMPRESSION: 1. Technically successful left IJ Trialysis catheter placement. Electronically Signed   By: Lucrezia Europe M.D.   On: 10/16/2021 15:31   IR US Guide Vasc Access Left  Result Date: 10/16/2021 CLINICAL DATA:  Recent removal of right IJ catheter. Needs access for hemodialysis EXAM: EXAM LEFT IJ CATHETER PLACEMENT UNDER ULTRASOUND AND FLUOROSCOPIC GUIDANCE TECHNIQUE: The procedure, risks (including but not limited to bleeding, infection, organ damage, pneumothorax), benefits, and alternatives were explained to the patient. Questions regarding the procedure were encouraged and answered. The patient understands and consents to the procedure. Patency of the LEFT IJ vein was confirmed with ultrasound with image documentation. An appropriate skin site was determined. Skin site was marked. Region was prepped using maximum barrier technique including cap and mask, sterile gown, sterile gloves, large sterile sheet, and Chlorhexidine as cutaneous antisepsis. The region was infiltrated locally with 1% lidocaine. Under real-time ultrasound guidance, the LEFT IJ vein was accessed with a 21 gauge needle; the needle tip within the vein was confirmed with ultrasound image documentation. The needle exchanged over a guidewire for vascular dilator which allowed advancement of a 20 cm Trialysis catheter. This was positioned with the in the mid right atrium. Spot chest radiograph shows good positioning and no pneumothorax. Catheter was flushed and sutured externally with 0-Prolene sutures. Patient tolerated  the procedure well. FLUOROSCOPY: Radiation Exposure Index (as provided by the fluoroscopic device): 1 mGy air Kerma COMPLICATIONS: COMPLICATIONS none IMPRESSION: 1. Technically successful left IJ Trialysis catheter placement. Electronically Signed   By: Lucrezia Europe M.D.   On: 10/16/2021 15:31    Medications:  anticoagulant sodium citrate     calcium gluconate     citrate dextrose     dextrose 5% lactated ringers 30 mL/hr at 10/16/21 0826   lactated ringers Stopped (10/12/21 2239)    aspirin EC  81 mg Oral Daily   atorvastatin  40 mg Oral Daily   Chlorhexidine Gluconate Cloth  6 each Topical Q0600   cinacalcet  90 mg Oral Q supper   clopidogrel  75 mg Oral Daily   darbepoetin (ARANESP) injection - DIALYSIS  200 mcg Intravenous Q Thu-HD   dorzolamide-timolol  1 drop Left Eye BID   feeding supplement (NEPRO CARB STEADY)  960 mL Per Tube Q24H   feeding supplement (PROSource TF)  45 mL Per Tube BID   heparin injection (subcutaneous)  5,000 Units Subcutaneous Q8H   insulin aspart  0-6 Units Subcutaneous Q4H   insulin glargine-yfgn  5 Units Subcutaneous QHS   lidocaine-prilocaine  1 application. Topical UD   mouth rinse  15 mL Mouth Rinse  BID   multivitamin  1 tablet Oral QHS   nutrition supplement (JUVEN)  1 packet Per Tube BID BM   pantoprazole sodium  40 mg Oral Daily   senna-docusate  1 tablet Oral BID   sevelamer carbonate  1.6 g Oral TID WC   thiamine  100 mg Oral Daily    D[facility] Orders: [facility] 3h 28min   400/ 600   59.5kg   2/2 bath  Prof 2   RU AV fistula  Hep 4000  Mircera 200 mcg IV every 2 weeks - last 1/26 Ancef 2g 3 times weekly HD Covid neg 2/3, hep B SAg neg 2/4   Assessment/Plan: Embolic stroke with AMS -neuro following, acute embolic CVA / Repeat MRI with no acute findings.  With PFO on TTE. TEE showed small PFO and no clot. Blood Cx 2/4 negative. S/p course of IV abx.  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 Now receiving PLEX today (4th treatment). 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 Plan for LP after 5 days off of ASA and Plavix. Getting PLEX QOD.  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 Please see #12 for alternative access. 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 Trula Slade [facility] following.  To go to CIR at some point.  ESRD - Usually TTS schedule - Currently off schedule. Tolerated yesterday's HD with minimal UF d/t episodes of hypotension. Last CXR unremarkable and not showing signs for volume overload. May be dry-plan to run even with tomorrow's HD.Marland Kitchen Trying to keep SBP > 100 on HD due to #1. Continue to monitor behavior during HD and tolerate dialysis in chair before dc. HTN/volume - noted episodes of hypotension during HD,  improved with fluid /midodrine with HD. Below prior EDW, will need to be lowered on d/c. Sepsis/ shock - resolved  Anemia of CKD- last Hgb stable 8.3. Continue Aranesp 28mcg q Thurs. Transfuse pRBC prn.  MBD: CorrCa high, Phos ok. Continue home sensipar/binders, hold VDRA for now.   Severe protein calorie malnutrition: NG tube in place with Nepro TF's + supplements. DMT2 - per PMD  Access: LUE AVF with some eschar- sticking away from now, monitor closely Facial edema- AM of 10/15/21--> ? Delayed reaction to PLEX with FFP vs SVC- Mar reviewed, no new meds. Received Solumedrol/ benadryl/ famotidine, R EJ catheter removed.  D/w hospitalist.  Since pt has had her AKA and no bleeding > 48 hrs out, OK to convert PLEX to albumin replacement fluid. S/p L IJ trialysis HD cath in IR 3/6 by Dr. Denman George - likely will need SNF placement/versus CIR  Tobie Poet, NP [facility] 10/17/2021,3:46 PM  LOS: 32 days

## 2021-10-17 NOTE — Progress Notes (Signed)
Physical Therapy Treatment ?Patient Details ?Name: [name] ?MRN: [id number] ?DOB: [birth year] ?Today's Date: 10/17/2021 ? ? ?History of Present Illness Pt is a 41 y.o. female admitted 09/15/21 post-HD session with concern for AMS when pt stopped speaking to staff. 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 Pt with increased somnolence 2/6; noted to be more lethargic in HD 2/7, 2/9, 2/11. Head CT 2/10 negative for acute injury; chronic small vessel disease and infarcts. MRI 2/10 with scattered punctate acute infarcts bilateral cerebral hemispheres (may be embolic), potential subacute infarct L corona radiata. Pt with persistent bilateral ptosis (pt reports ongoing ~1 month). MRI orbits 2/25 with evolution of infarcts seen on prior study; no acute infarct; chronic R retinal detachment. Per neurology 2/28, high suspicion for miller fischer GBS; to proceed with PLEX treatment. S/p R IJ temporary HD cath placement 3/1. 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 Pt with worsening face/throat swelling; s/p R IJ removal 3/5. S/p L IJ HD cath 3/6. PMH includes ESRD (HD TTS), DM2, HTN, neuropathy, R eye blindness (2008). ?  ?PT Comments  ? ? Pt receiving PLEX tx this afternoon, limiting OOB activity. Pt with bowel incontinence, performing bed mobility with minA (within confines of LIJ lines). Pt demonstrates improving UE/LE muscle activation, able to perform supine therex with max verbal cues. Pt seems to be in good spirits today, noted improved interaction and command following. Pt reports agreeable to OOB activity attempts tomorrow; hopeful she will demonstrate improved activity tolerance to allow for intensive post-acute therapies.  ?   ?Recommendations for follow up therapy are one component of a multi-disciplinary discharge planning process, led by the attending physician.  Recommendations may be updated based on patient status, additional functional criteria and insurance authorization. ? ?Follow Up  Recommendations ? Acute inpatient rehab (3hours/day) ?  ?  ?Assistance Recommended at Discharge Frequent or constant Supervision/Assistance  ?Patient can return home with the following Assistance with cooking/housework;Assistance with feeding;Direct supervision/assist for medications management;Direct supervision/assist for financial management;Assist for transportation;Help with stairs or ramp for entrance;A lot of help with bathing/dressing/bathroom;A lot of help with walking and/or transfers ?  ?Equipment Recommendations ? BSC/3in1;Wheelchair (measurements PT);Wheelchair cushion (measurements PT);Hospital bed;Other (comment) (lift equipment)  ?  ?Recommendations for Other Services   ? ? ?  ?Precautions / Restrictions Precautions ?Precautions: Fall;Other (comment) ?Precaution Comments: R BKA now R AKA; bilateral ptosis with blindness; bowel incontinence; PLEX treatment every other day (cannot mobilize while running per neuro MD)  ?  ? ?Mobility ? Bed Mobility ?Overal bed mobility: Needs Assistance ?Bed Mobility: Rolling ?Rolling: Min assist ?  ?  ?  ?  ?General bed mobility comments: deferred seated or OOB mobility due to PLEX running; pt with bowel incontinence requiring minA for rolling R and partial rolling L (limited by LIJ cath with PLEX); encouraged pt engagement in pericare/washup, pt able to assist anteriorly still requiring totalA for posterior pericare ?  ? ?Transfers ?  ?  ?  ?  ?  ?  ?  ?  ?  ?General transfer comment: unable due to PLEX running per MD order; pt seems motivated to try OOB tomorrow ?  ? ?Ambulation/Gait ?  ?  ?  ?  ?  ?  ?  ?  ? ? ?Stairs ?  ?  ?  ?  ?  ? ? ?Wheelchair Mobility ?  ? ?Modified Rankin (Stroke Patients Only) ?  ? ? ?  ?Balance   ?  ?  ?  ?  ?  ?  ?  ?  ?  ?  ?  ?  ?  ?  ?  ?  ?  ?  ?  ? ?  ?  Cognition Arousal/Alertness: Awake/alert ?Behavior During Therapy: Flat affect ?Overall Cognitive Status: Difficult to assess ?  ?  ?  ?  ?  ?  ?  ?  ?  ?  ?  ?  ?  ?  ?  ?  ?General  Comments: pt engaged and answering questions appropriately, seems in good spirits; inconsistent command following with therex, but seems to be improving overall. discussed plan for tomorrow's PT session, pt states, "I will try" ?  ?  ? ?  ?Exercises General Exercises - Lower Extremity ?Quad Sets: AAROM, Left, Supine ?Short Arc Quad: AAROM, Left, Supine ?Heel Slides: AROM, Left, Supine ?Straight Leg Raises: AAROM, Left, Supine ?Amputee Exercises ?Hip ABduction/ADduction: AROM, Right, Supine ?Hip Flexion/Marching: AROM, Right, Supine ? ?  ?General Comments General comments (skin integrity, edema, etc.): pt demonstrates improving BUE/BLE muscle activation, particularly related to LLE, although still weak. Able to perform full R hip flex/abd/add, achieving hip flexor stretch supine with improved tolerance; LLE able to perform heel slides, still with decreased quad/hip activation requiring AAROM for SLR and LAQ, difficulty sequencing quad set, no active toe or ankle ROM noted. Improving ability to reach for bed rails and reposition without cues. Pt states, "I can't see" when engaging in pericare, but encouraged compensatory strategies; continues to require encouragement to perform tasks as indep as possible ?  ?  ? ?Pertinent Vitals/Pain Pain Assessment ?Pain Assessment: Faces ?Faces Pain Scale: Hurts little more ?Pain Location: pernieal area and buttocks with pericare ?Pain Descriptors / Indicators: Guarding, Discomfort ?Pain Intervention(s): Monitored during session, Limited activity within patient's tolerance  ? ? ?Home Living   ?  ?  ?  ?  ?  ?  ?  ?  ?  ?   ?  ?Prior Function    ?  ?  ?   ? ?PT Goals (current goals can now be found in the care plan section) Acute Rehab PT Goals ?Patient Stated Goal: to get to rehab ?PT Goal Formulation: With patient ?Time For Goal Achievement: 10/31/21 ?Potential to Achieve Goals: Fair ?Progress towards PT goals: Progressing toward goals (demonstrates improving muscle activation and  interaction this session, although OOB limited by PLEX tx) ? ?  ?Frequency ? ? ? Min 4X/week ? ? ? ?  ?PT Plan Current plan remains appropriate  ? ? ?Co-evaluation   ?  ?  ?  ?  ? ?  ?AM-PAC PT "6 Clicks" Mobility   ?Outcome Measure ? Help needed turning from your back to your side while in a flat bed without using bedrails?: A Lot ?Help needed moving from lying on your back to sitting on the side of a flat bed without using bedrails?: A Lot ?Help needed moving to and from a bed to a chair (including a wheelchair)?: Total ?Help needed standing up from a chair using your arms (e.g., wheelchair or bedside chair)?: Total ?Help needed to walk in hospital room?: Total ?Help needed climbing 3-5 steps with a railing? : Total ?6 Click Score: 8 ? ?  ?End of Session   ?Activity Tolerance: Other (comment) (PLEX treatment running) ?Patient left: in bed;with call bell/phone within reach;with bed alarm set;with nursing/sitter in room (PLEX RN) ?Nurse Communication: Mobility status ?PT Visit Diagnosis: Other abnormalities of gait and mobility (R26.89);Muscle weakness (generalized) (M62.81);Difficulty in walking, not elsewhere classified (R26.2);Other symptoms and signs involving the nervous system (R29.898) ?Pain - Right/Left: Right ?Pain - part of body: Leg ?  ? ? ?Time: 2500-3704 ?PT Time Calculation (min) (ACUTE  ONLY): 22 min ? ?Charges:  $Therapeutic Exercise: 8-22 mins          ?          ? ?Mabeline Caras, PT, DPT ?Acute Rehabilitation Services  ?Pager [phone] ?Office [phone] ? ?Derry Lory ?10/17/2021, 5:35 PM ? ?

## 2021-10-17 NOTE — Progress Notes (Signed)
Pt completed Therapeutic Plasma Exchange treatment without issue. Pt tolerated well. Report given to F. Monyette RN ?

## 2021-10-17 NOTE — Progress Notes (Signed)
SLP Cancellation Note ? ?Patient Details ?Name: [name] ?MRN: [id number] ?DOB: June 17, 1981 ? ? ?Cancelled treatment:        Spoke with RN and pt who continues to report odonophagia when she swallows her saliva and "I think it's swollen. There has been no relief of symptoms starting 2-3 days ago. She declined attempting ice chip or applesauce and tube feeds are running. ST will plan to check on pt Thursday for any improvements and strongly encourage her to try po's.  ? ? ?Houston Siren ?10/17/2021, 12:28 PM ? ?Orbie Pyo [place].Ed CCC-SLP ?Speech-Language Pathologist ?Pager [phone] ?Office [phone] ? ?

## 2021-10-17 NOTE — Plan of Care (Signed)
  Problem: Education: Goal: Knowledge of General Education information will improve Description: Including pain rating scale, medication(s)/side effects and non-pharmacologic comfort measures Outcome: Progressing   Problem: Clinical Measurements: Goal: Ability to maintain clinical measurements within normal limits will improve Outcome: Progressing   

## 2021-10-17 NOTE — Progress Notes (Addendum)
PROGRESS NOTE  [name] HEN:[id number] DOB: Dec 16, 1980 DOA: 09/15/2021 PCP: [facility]   LOS: 32 days   Brief Narrative / Interim history: [name] is a 41 y.o. female with medical history significant for poorly controlled DMT2, ESRD on TTS HD, HTN with poor compliance with medical treatment and HD was sent after completing dialysis session due to staff concern for altered mental status.  She has a recent history of TMA of the right foot about 2 months ago, grew MSSA and was on Ancef with dialysis.  Vascular surgery as well as nephrology consulted.  Hospital course complicated by an episode of lethargy, minimal responsiveness on 2/10 requiring rapid response.  An MRI of the brain showed scattered punctate acute infarcts bilaterally.  Neurology consulted.  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  Neurology recommends Plex, status post HD catheter placed.  Received Plex 3/1, 3/3, 3/4 and today 3/7.  Last session scheduled for Thursday  Subjective / 24h Interval events: Reports persistent weakness today.  Denies any chest pain, denies any shortness of breath.  Still cannot open her eyes  Assesement and Plan: Principal Problem:   Osteomyelitis ([place]) Active Problems:   Anemia of chronic disease   Hyperglycemia due to type 2 diabetes mellitus (HCC)   ESRD on hemodialysis (HCC)   Acute encephalopathy   Cerebral embolism with cerebral infarction   Pressure injury of skin   PFO (patent foramen ovale)   Malnutrition of moderate degree   Ptosis of both eyelids   Assessment and Plan: Principal problem Embolic stroke, with positive bubble study -neurology consulted and following.  An MRI of the brain 2/10 showed scattered punctate acute infarcts throughout both cerebral hemispheres which appeared to be embolic. A transesophageal echo done on 2/20 showed small PFO with no thrombus.  Completed stroke work-up.   Currently she is on statin, aspirin and Plavix  Active problems Facial swelling on 3/5-concern for allergic episode versus congestion due to SVC-like syndrome in the setting of chronically occluded IJ and dialysis catheter placed in the EJ.  Allergic reaction could not be fully excluded and she also received Solu-Medrol, Pepcid, Benadryl, however no clear-cut reason for her to have an allergic reaction since there are no new medications.  Plex catheter was removed with significant improvement in her swelling has resolved completely.  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  Plan for 5 sessions, already done on 3/1, 3/3, 3/4, 2 sessions remaining, 1 today and 1 on Thursday, neurology following.  Since EJ catheter removed, she had a femoral line placed on Monday to complete the Plex.  After Plex is done discontinue the catheter  ESRD-nephrology following, continue HD as per schedule  Hypotension-previously on midodrine, now off  Moderate malnutrition-has a core track in place.  Encourage p.o. intake, spouse tells me he has been eating more.  Dietitian following.  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  Wound looks good  DM2-continue glargine and sliding scale.  No further hypoglycemia, continue current regimen  CBG (last 3)  Recent Labs    10/17/21 0430 10/17/21 0750 10/17/21 1210  GLUCAP 154* 242* 280*     Scheduled Meds:  aspirin EC  81 mg Oral Daily   atorvastatin  40 mg Oral Daily   Chlorhexidine Gluconate Cloth  6 each Topical Q0600   cinacalcet  90 mg Oral Q supper   clopidogrel  75 mg Oral Daily   darbepoetin (ARANESP) injection - DIALYSIS  200 mcg Intravenous Q Thu-HD   dorzolamide-timolol  1 drop Left Eye  BID   feeding supplement (NEPRO CARB STEADY)  960 mL Per Tube Q24H   feeding supplement (PROSource TF)  45 mL Per Tube BID   heparin injection (subcutaneous)  5,000 Units Subcutaneous Q8H   insulin aspart  0-6 Units Subcutaneous Q4H   insulin glargine-yfgn  5 Units Subcutaneous QHS   lidocaine-prilocaine  1 application. Topical UD   mouth rinse  15 mL Mouth Rinse BID   multivitamin  1 tablet Oral QHS   nutrition supplement (JUVEN)  1 packet Per Tube BID BM   pantoprazole sodium  40 mg Oral Daily   senna-docusate  1 tablet Oral BID   sevelamer carbonate  1.6 g Oral TID WC   thiamine  100 mg Oral Daily   Continuous Infusions:  anticoagulant sodium citrate     calcium gluconate     calcium gluconate     citrate dextrose     citrate dextrose     dextrose 5% lactated ringers 30 mL/hr at 10/16/21 3662   lactated ringers Stopped (10/12/21 2239)   PRN Meds:.acetaminophen, acetaminophen, alteplase, diphenhydrAMINE, guaiFENesin-dextromethorphan, heparin, heparin, iohexol, ipratropium-albuterol, lidocaine (PF), lidocaine (PF), lidocaine-prilocaine, ondansetron (ZOFRAN) IV, oxyCODONE-acetaminophen, pentafluoroprop-tetrafluoroeth, phenol  Diet Orders (From admission, onward)     Start     Ordered   10/16/21 0643  Diet NPO time specified  Diet effective now        10/16/21 9476            DVT prophylaxis: heparin injection 5,000 Units Start: 10/09/21 1400 SCD's Start: 09/17/21 1002   Lab Results  Component Value Date   PLT 354 10/16/2021      Code Status: Full Code  Family Communication: s/o at bedside  Status is: Inpatient  Remains inpatient appropriate because: severity of illness   Level of care: Telemetry Medical  Consultants:  Neurology  Procedures:  2D echo  Microbiology  none  Antimicrobials: none    Objective: Vitals:   10/16/21 2300 10/17/21 0600 10/17/21 0746 10/17/21 0754  BP: (!) 89/31 (!) 103/37  (!) 97/43  Pulse: 89  (!) 101 98  Resp: 18 14  18 14   Temp: 97.9 F (36.6 C) 99.5 F (37.5 C)  99.2 F (37.3 C)  TempSrc: Oral Oral  Axillary  SpO2: 100% 100% 92% 100%  Weight:      Height:        Intake/Output Summary (Last 24 hours) at 10/17/2021 1337 Last data filed at 10/17/2021 0600 Gross per 24 hour  Intake 537.17 ml  Output 670 ml  Net -132.83 ml    Wt Readings from Last 3 Encounters:  10/16/21 58.4 kg  09/04/21 60.8 kg  08/25/21 61 kg    Examination: Constitutional: NAD Eyes: lids and conjunctivae normal, no scleral icterus ENMT: mmm Neck: normal, supple Respiratory: clear to auscultation bilaterally, no wheezing, no crackles.  Cardiovascular: Regular rate and rhythm, no murmurs / rubs / gallops.  Abdomen: soft, no distention, no tenderness. Bowel sounds positive.  Skin: no rashes Neurologic: no focal deficits, equal strength  Data Reviewed: I have independently reviewed following labs and imaging studies   CBC Recent Labs  Lab 10/10/21 1418  10/11/21 0950 10/12/21 0805 10/14/21 0218 10/16/21 1407  WBC 8.8  --  11.9* 16.2* 12.5*  HGB 8.6* 12.2 10.2* 8.8* 8.3*  HCT 29.4* 36.0 35.6* 29.8* 28.3*  PLT 310  --  318 286 354  MCV 99.3  --  100.6* 98.0 97.9  MCH 29.1  --  28.8 28.9 28.7  MCHC 29.3*  --  28.7* 29.5* 29.3*  RDW 23.6*  --  22.5* 20.7* 19.7*  LYMPHSABS  --   --  2.3  --   --   MONOABS  --   --  1.1*  --   --   EOSABS  --   --  0.5  --   --   BASOSABS  --   --  0.1  --   --      Recent Labs  Lab 10/10/21 1418 10/11/21 0950 10/12/21 0805 10/14/21 0218 10/16/21 1406  NA 128* 132* 135 139 134*  K 5.5* 4.8 4.8 6.2* 4.0  CL 92* 98 96* 101 97*  CO2 24  --  27 23 26   GLUCOSE 184* 69* 77 51* 145*  BUN 110* 47* 56* 43* 42*  CREATININE 7.61* 5.20* 5.79* 5.74* 5.77*  CALCIUM 10.7*  --  11.2* 10.5* 9.3  ALBUMIN 2.1*  --  2.9*  --  2.2*    ------------------------------------------------------------------------------------------------------------------ No results for input(s): CHOL, HDL,  LDLCALC, TRIG, CHOLHDL, LDLDIRECT in the last 72 hours.  Lab Results  Component Value Date   HGBA1C 10.4 (H) 08/11/2021   ------------------------------------------------------------------------------------------------------------------ No results for input(s): TSH, T4TOTAL, T3FREE, THYROIDAB in the last 72 hours.  Invalid input(s): FREET3  Cardiac Enzymes No results for input(s): CKMB, TROPONINI, MYOGLOBIN in the last 168 hours.  Invalid input(s): CK ------------------------------------------------------------------------------------------------------------------ No results found for: BNP  CBG: Recent Labs  Lab 10/16/21 2010 10/16/21 2343 10/17/21 0430 10/17/21 0750 10/17/21 1210  GLUCAP 111* 140* 154* 242* 280*     No results found for this or any previous visit (from the past 240 hour(s)).   Radiology Studies: No results found.   Marzetta Board, MD, PhD [facility]  Between 7 am - 7 pm I am available, please contact me via Amion (for emergencies) or Securechat (non urgent messages)  Between 7 pm - 7 am I am not available, please contact night coverage MD/APP via Amion

## 2021-10-17 NOTE — Progress Notes (Addendum)
Nutrition Follow-up ? ?DOCUMENTATION CODES:  ?Non-severe (moderate) malnutrition in context of acute illness/injury ? ?INTERVENTION:  ?Resume continuous TF via Cortrak: ?-Nepro @ 30m/hr (9675m ?-4559mrosource TF BID ? ?Provides 1944 kcals, 99 grams protein, 697m51mee water ? ?Recommend transition insulin to Q4H instead of meal coverage as TF regimen is returning to continuous rate ? ?Recommend placement of PEG for long-term nutrition support ? ?Recommend initiation of bowel regimen as no BM documented since 3/1  ? ?-continue renavite daily ?-continue juven BID for wound healing ? ?NUTRITION DIAGNOSIS:  ?Moderate Malnutrition related to acute illness (osteomyelitis of R foot) as evidenced by mild fat depletion, moderate muscle depletion. -- ongoing ? ?GOAL:  ?Patient will meet greater than or equal to 90% of their needs -- met w/ TF at goal ? ?MONITOR:  ?PO intake, Supplement acceptance, Labs, Weight trends, TF tolerance, Skin, I & O's ? ?REASON FOR ASSESSMENT:  ?Consult ?Assessment of nutrition requirement/status, Enteral/tube feeding initiation and management ? ?ASSESSMENT:  ?40 y69. 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 In ED, imaging suggested osteomyelitis of the right foot. ? ?02/05 - s/p R BKA ?02/11 - NG tube placed, tube feeds initiated ?02/13 - CT abdomen/pelvis negative for rectovaginal fistula ?02/15 - NG tube removed, diet advanced to dysphagia 2 with thin liquids, due to waning mental status, a Cortrak was placed for nocturnal tube feeds  ?2/20 - s/p TEE, no thrombus detected, small PFO noted ?3/01 - PLEX initiated; s/p R AKA ?3/06 -  L IJ Trialysis HD CVC 20cm to mid RA ? ?Per Nephrology, pt is to have PLEX again today; this will be the 4th of 5 planned sessions.  ? ?Discussed pt with RN and with SLP. Note plan last week had been for Cortrak to be removed  per MD, though it appears this never occurred. Pt's intake has since significantly declined and pt is now NPO again, so plan to resume continuous TF via Cortrak. Recommend placement of PEG for long-term nutrition support.  ? ?PO Intake: 10% x 1 recorded meal since last RD assessment ? ?Pt had HD off schedule yesterday with 670ml71m UF ?Admit wt: 55 kg ?Pre-HD wt: 59.7 kg ?Post-HD wt: 58.4 kg  ?EDW: 59.5 kg  ?Pt will need new lowered EDW at discharge as pt is currently below EDW ? ?Recommend initiation of bowel regimen as no BM documented since 3/1 (sent secure chat to MD regarding this) ? ?Medications: sensipar, aranesp, SSI TID w/ meals, 5 units semglee daily, thiamine, renavit, protonix, renvela TID, Juven BID ?IVF: D5 in LR @ 30ml/81m?Labs: Na 134, PO4 6.4, corrected Ca 10.74, hgb 8.3 ?CBGs: 101-242 x 24 hours  ? ?Diet Order:   ?Diet Order   ? ?       ?  Diet NPO time specified  Diet effective now       ?  ? ?  ?  ? ?  ? ?EDUCATION NEEDS: ?Not appropriate for education at this time ? ?Skin:  Skin Assessment: Skin Integrity Issues: ?Skin Integrity Issues:: Other (Comment), Stage II ?Stage II: sacrum ?Incisions: R BKA ?Other: MASD bilateral buttocks ? ?Last BM:  3/1 ? ?Height:  ?Ht Readings from Last 1 Encounters:  ?10/02/21 '5\' 1"'  (1.549 m)  ? ?Weight:  ?Wt Readings from Last 1 Encounters:  ?10/16/21 58.4 kg  ? ?BMI:  Body mass index  is 24.33 kg/m?. ? ?Estimated Nutritional Needs: ?Kcal:  1800-2000 kcal/d ?Protein:  90-100 g/d ?Fluid:  1L+UOP ? ? ? ? ?Theone Stanley., MS, RD, LDN (she/her/hers) ?RD pager number and weekend/on-call pager number located in [place]. ? ?

## 2021-10-18 DIAGNOSIS — I6340 Cerebral infarction due to embolism of unspecified cerebral artery: Secondary | ICD-10-CM | POA: Diagnosis not present

## 2021-10-18 DIAGNOSIS — I959 Hypotension, unspecified: Secondary | ICD-10-CM

## 2021-10-18 DIAGNOSIS — G610 Guillain-Barre syndrome: Secondary | ICD-10-CM

## 2021-10-18 LAB — COMPREHENSIVE METABOLIC PANEL
ALT: 15 U/L (ref 0–44)
AST: 20 U/L (ref 15–41)
Albumin: 2.6 g/dL — ABNORMAL LOW (ref 3.5–5.0)
Alkaline Phosphatase: 59 U/L (ref 38–126)
Anion gap: 11 (ref 5–15)
BUN: 50 mg/dL — ABNORMAL HIGH (ref 6–20)
CO2: 32 mmol/L (ref 22–32)
Calcium: 10.4 mg/dL — ABNORMAL HIGH (ref 8.9–10.3)
Chloride: 96 mmol/L — ABNORMAL LOW (ref 98–111)
Creatinine, Ser: 4.7 mg/dL — ABNORMAL HIGH (ref 0.44–1.00)
GFR, Estimated: 11 mL/min — ABNORMAL LOW (ref >60)
Glucose, Bld: 150 mg/dL — ABNORMAL HIGH (ref 70–99)
Potassium: 3.7 mmol/L (ref 3.5–5.1)
Sodium: 139 mmol/L (ref 135–145)
Total Bilirubin: 0.4 mg/dL (ref 0.3–1.2)
Total Protein: 6.1 g/dL — ABNORMAL LOW (ref 6.5–8.1)

## 2021-10-18 LAB — CBC
HCT: 32.1 % — ABNORMAL LOW (ref 36.0–46.0)
Hemoglobin: 9.3 g/dL — ABNORMAL LOW (ref 12.0–15.0)
MCH: 28.1 pg (ref 26.0–34.0)
MCHC: 29 g/dL — ABNORMAL LOW (ref 30.0–36.0)
MCV: 97 fL (ref 80.0–100.0)
Platelets: 320 10*3/uL (ref 150–400)
RBC: 3.31 MIL/uL — ABNORMAL LOW (ref 3.87–5.11)
RDW: 19.2 % — ABNORMAL HIGH (ref 11.5–15.5)
WBC: 12.4 10*3/uL — ABNORMAL HIGH (ref 4.0–10.5)
nRBC: 0 % (ref 0.0–0.2)

## 2021-10-18 LAB — THERAPEUTIC PLASMA EXCHANGE (BLOOD BANK)
Plasma Exchange: 2400
Plasma volume needed: 2400
Unit division: 0
Unit division: 0
Unit division: 0

## 2021-10-18 LAB — GLUCOSE, CAPILLARY
Glucose-Capillary: 127 mg/dL — ABNORMAL HIGH (ref 70–99)
Glucose-Capillary: 169 mg/dL — ABNORMAL HIGH (ref 70–99)
Glucose-Capillary: 119 mg/dL — ABNORMAL HIGH (ref 70–99)
Glucose-Capillary: 110 mg/dL — ABNORMAL HIGH (ref 70–99)
Glucose-Capillary: 143 mg/dL — ABNORMAL HIGH (ref 70–99)

## 2021-10-18 LAB — MAGNESIUM: Magnesium: 1.9 mg/dL (ref 1.7–2.4)

## 2021-10-18 LAB — PHOSPHORUS: Phosphorus: 4.4 mg/dL (ref 2.5–4.6)

## 2021-10-18 MED ORDER — SODIUM BICARBONATE 650 MG PO TABS
650.0000 mg | ORAL_TABLET | Freq: Once | ORAL | Status: AC
  Administered 2021-10-18: 650 mg
  Filled 2021-10-18: qty 1

## 2021-10-18 MED ORDER — PANCRELIPASE (LIP-PROT-AMYL) 10440-39150 UNITS PO TABS
20880.0000 [IU] | ORAL_TABLET | Freq: Once | ORAL | Status: AC
  Administered 2021-10-18: 20880 [IU]
  Filled 2021-10-18: qty 2

## 2021-10-18 NOTE — Progress Notes (Signed)
PT Cancellation Note ? ?Patient Details ?Name: [name] ?MRN: [id number] ?DOB: 12-12-1980 ? ? ?Cancelled Treatment:    Reason Eval/Treat Not Completed: Medical issues which prohibited therapy - pt recently returned from HD, current BP 69/43, HR 104. Will follow-up for PT treatment. ? ?Mabeline Caras, PT, DPT ?Acute Rehabilitation Services  ?Pager [phone] ?Office [phone] ? ?Derry Lory ?10/18/2021, 12:37 PM ?

## 2021-10-18 NOTE — Assessment & Plan Note (Signed)
Pressure Injury 09/24/21 Sacrum Right;Left;Medial Stage 2 -  Partial thickness loss of dermis presenting as a shallow open injury with a red, pink wound bed without slough. (Active)  09/24/21 1600  Location: Sacrum  Location Orientation: Right;Left;Medial  Staging: Stage 2 -  Partial thickness loss of dermis presenting as a shallow open injury with a red, pink wound bed without slough.  Wound Description (Comments):   Present on Admission:

## 2021-10-18 NOTE — Progress Notes (Signed)
Cortrak Tube Team Note: ? ?Consult received due to Cortrak being clogged. Unclogging protocol ordered. RD able to successfully unclog tube.  ? ?If the tube becomes dislodged please keep the tube and contact the Cortrak team at [email or web address] (password [email or web address]) for replacement.  ?If after hours and replacement cannot be delayed, place a NG tube and confirm placement with an abdominal x-ray.  ? ? ? ?Theone Stanley., MS, RD, LDN (she/her/hers) ?RD pager number and weekend/on-call pager number located in [place]. ? ? ?

## 2021-10-18 NOTE — Assessment & Plan Note (Addendum)
Hospital course remarkable for bilateral ptosis, facial diplegia, ophthalmoplegia, hyperreflexia.  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  MRI of the brain did not show any intracranial mass or abnormal enhancement to explain patient's ptosis, ophthalmoplegia.  MRI of the orbits symmetric extraocular muscles, no abnormal enhancement of the optic nerve sheath.  Neurology recommended Plex. Per neurology, had catheter placed 3/1, started Plex.  Done on 3/1, 3/3, 3/4, 3/7 ,3/9 , neurology were following. Session 5/5.I think we can  discontinue the central line. Neurology recommends EMG/nerve function test as an outpatient, outpatient neurology follow-up.

## 2021-10-18 NOTE — Assessment & Plan Note (Addendum)
Osteomyelitis of the right foot.with history of BKA, concern for eschar formation in the right stump, status post AKA on the right side 10/11/2021 by vascular surgery.  Wound looks good ?

## 2021-10-18 NOTE — Assessment & Plan Note (Addendum)
Embolic stroke, with positive bubble study -neurology consulted and following.  An MRI of the brain 2/10 showed scattered punctate acute infarcts throughout both cerebral hemispheres which appeared to be embolic. A transesophageal echo done on 2/20 showed small PFO with no thrombus.  Completed stroke work-up.  Currently she is on statin, aspirin and Plavix but this will be held for upcoming PEG procedure. Neurology recommended 30-day cardiac event monitoring as an outpatient

## 2021-10-18 NOTE — Assessment & Plan Note (Signed)
Secondary to chronic kidney disease.  Currently hemoglobin stable.  Monitor ?

## 2021-10-18 NOTE — Progress Notes (Signed)
Occupational Therapy Treatment Patient Details Name: [name] MRN: [id number] DOB: [birth year] Today's Date: 10/18/2021   History of present illness Pt is a 41 y.o. female admitted 09/15/21 post-HD session with concern for AMS when pt stopped speaking to staff. 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 Pt with increased somnolence 2/6; noted to be more lethargic in HD 2/7, 2/9, 2/11. Head CT 2/10 negative for acute injury; chronic small vessel disease and infarcts. MRI 2/10 with scattered punctate acute infarcts bilateral cerebral hemispheres (may be embolic), potential subacute infarct L corona radiata. Pt with persistent bilateral ptosis (pt reports ongoing ~1 month). MRI orbits 2/25 with evolution of infarcts seen on prior study; no acute infarct; chronic R retinal detachment. Per neurology 2/28, high suspicion for miller fischer GBS; to proceed with PLEX treatment. S/p R IJ temporary HD cath placement 3/1. 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 Pt with worsening face/throat swelling; s/p R IJ removal 3/5. S/p L IJ HD cath 3/6. PMH includes ESRD (HD TTS), DM2, HTN, neuropathy, R eye blindness (2008).   OT comments  Pt making slow progress towards goals this session, able to sit EOB for seated ADL and UE exercise. Pt min-total A for ADLs this session, benefits from increased verbal/tactile cuing and hand over hand guidance. Pt mod A +2 for bed mobility, HR variable during session ranging from 90's -130's with EOB activity. Transfers deferred at this time due to HR. BP 103/62 after session, all other VSS. Pt presenting with impairments listed below, will follow acutely. Continue to recommend AIR/CIR at d/c.   Recommendations for follow up therapy are one component of a multi-disciplinary discharge planning process, led by the attending physician.  Recommendations may be updated based on patient status, additional functional criteria and insurance authorization.    Follow Up  Recommendations  Acute inpatient rehab (3hours/day)    Assistance Recommended at Discharge Frequent or constant Supervision/Assistance  Patient can return home with the following  A lot of help with walking and/or transfers;A lot of help with bathing/dressing/bathroom;Assistance with cooking/housework;Assistance with feeding;Direct supervision/assist for medications management;Direct supervision/assist for financial management;Assist for transportation   Equipment Recommendations  None recommended by OT    Recommendations for Other Services      Precautions / Restrictions Precautions Precautions: Fall;Other (comment) Precaution Comments: R BKA now R AKA; bilateral ptosis with blindness; bowel incontinence; PLEX treatment every other day (cannot mobilize while running per neuro MD) Required Braces or Orthoses: Other Brace Other Brace: RLE limb guard Restrictions Weight Bearing Restrictions: Yes RUE Weight Bearing: Weight bearing as tolerated RLE Weight Bearing: Non weight bearing       Mobility Bed Mobility Overal bed mobility: Needs Assistance Bed Mobility: Rolling Rolling: Mod assist Sidelying to sit: Mod assist, +2 for physical assistance, Max assist   Sit to supine: Mod assist, Max assist        Transfers                   General transfer comment: deferred due to HR     Balance Overall balance assessment: Needs assistance Sitting-balance support: Feet supported, Bilateral upper extremity supported Sitting balance-Leahy Scale: Poor Sitting balance - Comments: requires min A to sit EOB during session       Standing balance comment: unable                           ADL either performed or assessed with clinical judgement  ADL Overall ADL's : Needs assistance/impaired     Grooming: Wash/dry hands;Wash/dry face;Set up;Sitting   Upper Body Bathing: Total assistance;Sitting Upper Body Bathing Details (indicate cue type and reason):  completed sitting EOB         Lower Body Dressing: Total assistance;Bed level Lower Body Dressing Details (indicate cue type and reason): to don socks                    Extremity/Trunk Assessment Upper Extremity Assessment Upper Extremity Assessment: Generalized weakness RUE Deficits / Details: edema noted upper arm RUE Coordination: decreased gross motor;decreased fine motor LUE Deficits / Details: Pt noted decrease in control of BUE with coordination/use. Pt does better when able to grasp onto larger items (ie: red tubing for spoon)   Lower Extremity Assessment Lower Extremity Assessment: Defer to PT evaluation        Vision   Additional Comments: pt is blind   Perception Perception Perception: Impaired   Praxis Praxis Praxis: Impaired Praxis Impairment Details: Initiation;Motor planning    Cognition Arousal/Alertness: Awake/alert Behavior During Therapy: Flat affect Overall Cognitive Status: Difficult to assess                     Current Attention Level: Sustained   Following Commands: Follows one step commands with increased time Safety/Judgement: Decreased awareness of safety, Decreased awareness of deficits     General Comments: pt eager to mobilize this session, wanting to sit up EOB        Exercises      Shoulder Instructions       General Comments      Pertinent Vitals/ Pain       Pain Assessment Pain Assessment: Faces Pain Score: 8  Pain Location: back with returning to supine Pain Descriptors / Indicators: Guarding, Discomfort Pain Intervention(s): Limited activity within patient's tolerance, Monitored during session, Repositioned  Home Living                                          Prior Functioning/Environment              Frequency  Min 2X/week        Progress Toward Goals  OT Goals(current goals can now be found in the care plan section)  Progress towards OT goals: Progressing toward  goals  Acute Rehab OT Goals Patient Stated Goal: none stated OT Goal Formulation: With patient Time For Goal Achievement: 10/17/21 Potential to Achieve Goals: Fair ADL Goals Pt Will Perform Eating: with set-up;sitting;with adaptive utensils Pt Will Perform Grooming: with set-up;with supervision;sitting Pt Will Perform Upper Body Bathing: with set-up;with supervision;sitting Pt Will Perform Lower Body Bathing: with min assist;sit to/from stand Pt Will Perform Upper Body Dressing: with supervision;with set-up;sitting Pt Will Perform Lower Body Dressing: with mod assist;sitting/lateral leans Pt Will Transfer to Toilet: with mod assist;stand pivot transfer;bedside commode Pt Will Perform Toileting - Clothing Manipulation and hygiene: sitting/lateral leans;with caregiver independent in assisting;with modified independence Additional ADL Goal #1: Pt will be S to come up to sit EOB Additional ADL Goal #2: Pt will be alert and oriented x4 without cues  Plan Discharge plan remains appropriate    Co-evaluation                 AM-PAC OT "6 Clicks" Daily Activity     Outcome Measure   Help from another person  eating meals?: A Lot Help from another person taking care of personal grooming?: A Little Help from another person toileting, which includes using toliet, bedpan, or urinal?: Total Help from another person bathing (including washing, rinsing, drying)?: A Lot Help from another person to put on and taking off regular upper body clothing?: Total Help from another person to put on and taking off regular lower body clothing?: Total 6 Click Score: 10    End of Session    OT Visit Diagnosis: Other abnormalities of gait and mobility (R26.89);Unsteadiness on feet (R26.81);Muscle weakness (generalized) (M62.81);Other symptoms and signs involving cognitive function;Low vision, both eyes (H54.2) Pain - Right/Left: Right Pain - part of body: Ankle and joints of foot   Activity Tolerance  Patient limited by fatigue;Treatment limited secondary to medical complications (Comment)   Patient Left in bed;with call bell/phone within reach;with bed alarm set;with nursing/sitter in room   Nurse Communication Mobility status;Other (comment) (RN notified of pt's HR ranging from 90's -125-130s with activity, BP 103/62 after session, pt asymptomatic and VSS)        Time: 3300-7622 OT Time Calculation (min): 23 min  Charges: OT General Charges $OT Visit: 1 Visit OT Treatments $Self Care/Home Management : 8-22 mins $Therapeutic Activity: 8-22 mins  Lynnda Child, OTD, OTR/L Acute Rehab [phone] - [place] 10/18/2021, 3:49 PM

## 2021-10-18 NOTE — Assessment & Plan Note (Addendum)
-  has a core track in place. Dietitian following. speech therapy following her,continues to recommend NPO.She has agreed for PEG

## 2021-10-18 NOTE — Progress Notes (Signed)
Inpatient Rehab Admissions Coordinator:   ? ?CIR following for medical readiness. Pt. To have one more PLEX treatment and then medical team will determine whether or not Pt. Requires PEG placement. I will continue to follow for potential admit pending medical readiness. ? ?Clemens Catholic, MS, CCC-SLP ?Rehab Admissions Coordinator  ?[phone] (celll) ?[phone] (office) ? ?

## 2021-10-18 NOTE — Assessment & Plan Note (Addendum)
Currently blood pressure stable/soft

## 2021-10-18 NOTE — Assessment & Plan Note (Addendum)
Nephrology following for dialysis.She has an AV fistula on the right upper extremity.  Vascular surgery planning for revision of fistula

## 2021-10-18 NOTE — Progress Notes (Signed)
PROGRESS NOTE  [name]  EAV:[id number] DOB: 16-Apr-1981 DOA: 09/15/2021 PCP: [facility]   Brief Narrative:  [name] is a 41 y.o. female with medical history significant for poorly controlled DMT2, ESRD on TTS HD, HTN with poor compliance with medical treatment and HD was sent after completing dialysis session due to staff concern for altered mental status.  She has a recent history of TMA of the right foot about 2 months ago, grew MSSA and was on Ancef with dialysis.  Vascular surgery as well as nephrology consulted.  Hospital course complicated by an episode of lethargy, minimal responsiveness on 2/10 requiring rapid response.  An MRI of the brain showed scattered punctate acute infarcts bilaterally.  Neurology consulted.  There is also suspicion for [name] GBS given facial diplegia, ptosis, unreactive pupil.  Neurology recommends Plex, status post HD catheter placed.  Received Plex 3/1, 3/3, 3/4 and today 3/7.  Last session scheduled for Thursday.   Assessment & Plan:  Principal Problem:   Cerebral embolism with cerebral infarction Active Problems:   GBS (Guillain-Barre syndrome) (HCC)   Anemia of chronic disease   Hyperglycemia due to type 2 diabetes mellitus (HCC)   ESRD on hemodialysis (HCC)   Acute encephalopathy   Osteomyelitis (HCC)   Pressure injury of skin   PFO (patent foramen ovale)   Malnutrition of moderate degree   Ptosis of both eyelids   Hypotension   Assessment and Plan: * Cerebral embolism with cerebral infarction Embolic stroke, with positive bubble study -neurology consulted and following.  An MRI of the brain 2/10 showed scattered punctate acute infarcts throughout both cerebral hemispheres which appeared to be embolic. A transesophageal echo done on 2/20 showed small PFO with no thrombus.  Completed stroke work-up.  Currently she is on statin, aspirin and Plavix. Neurology recommended 30-day cardiac event monitoring as  an outpatient  GBS (Guillain-Barre syndrome) [facility]) Hospital course remarkable for bilateral ptosis, facial diplegia, ophthalmoplegia, hyperreflexia.  Neurology suspected [name] variant of GBS.  MRI of the brain did not show any intracranial mass or abnormal enhancement to explain patient's ptosis, ophthalmoplegia.  MRI of the orbits symmetric extraocular muscles, no abnormal enhancement of the optic nerve sheath.  Neurology recommended Plex. Per neurology, had catheter placed 3/1, started Plex.  Plan for 5 sessions, already done on 3/1, 3/3, 3/4, 3/7 and 1 on Thursday, neurology following.  Since EJ catheter removed, she had a femoral line placed  to complete the Plex.  After Plex is done discontinue the catheter. Neurology recommends EMG/nerve function test as an outpatient, outpatient neurology follow-up.  Hypotension Likely chronic follow-up.  Was on midodrine earlier, has been discontinued now.  Continue to monitor blood pressure.  Can resume midodrine if needed  Malnutrition of moderate degree -has a core track in place.  Encourage p.o. intake. Dietitian following.  If after Plex is finished there is no improvement could potentially need PEG tube before going to CIR  Pressure injury of skin Pressure Injury 09/24/21 Sacrum Right;Left;Medial Stage 2 -  Partial thickness loss of dermis presenting as a shallow open injury with a red, pink wound bed without slough. (Active)  09/24/21 1600  Location: Sacrum  Location Orientation: Right;Left;Medial  Staging: Stage 2 -  Partial thickness loss of dermis presenting as a shallow open injury with a red, pink wound bed without slough.  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  Wound looks good  ESRD on hemodialysis [facility]) Nephrology  following for dialysis.  Hyperglycemia due to type 2 diabetes mellitus ([place]) continue glargine and sliding scale.  .  Monitor blood sugars. continue current regimen  Anemia of chronic disease Secondary to chronic kidney disease.  Currently hemoglobin stable.  Monitor         Nutrition Problem: Moderate Malnutrition Etiology: acute illness (osteomyelitis of R foot)    DVT prophylaxis:heparin injection 5,000 Units Start: 10/09/21 1400 SCD's Start: 09/17/21 1002     Code Status: Full Code  Family Communication: None at bedside  Patient status:Inpatient  Patient is from :Home  Anticipated discharge to:CIR  Estimated DC date:not sure   Consultants: neurology, nephrology  Procedures: Hemodialysis  Antimicrobials:  Anti-infectives (From admission, onward)    Start     Dose/Rate Route Frequency Ordered Stop   09/26/21 1600  cefTRIAXone (ROCEPHIN) 2 g in sodium chloride 0.9 % 100 mL IVPB  Status:  Discontinued        2 g 200 mL/hr over 30 Minutes Intravenous Every 12 hours 09/26/21 1428 09/27/21 1356   09/24/21 1700  cefTRIAXone (ROCEPHIN) 2 g in sodium chloride 0.9 % 100 mL IVPB  Status:  Discontinued        2 g 200 mL/hr over 30 Minutes Intravenous Every 12 hours 09/24/21 0816 09/26/21 1428   09/23/21 1800  meropenem (MERREM) 1 g in sodium chloride 0.9 % 100 mL IVPB  Status:  Discontinued        1 g 200 mL/hr over 30 Minutes Intravenous Every 24 hours 09/22/21 1016 09/24/21 0816   09/23/21 1200  vancomycin (VANCOREADY) IVPB 500 mg/100 mL  Status:  Discontinued        500 mg 100 mL/hr over 60 Minutes Intravenous Every T-Th-Sa (Hemodialysis) 09/22/21 1016 09/25/21 0837   09/22/21 1115  meropenem (MERREM) 1 g in sodium chloride 0.9 % 100 mL IVPB  Status:  Discontinued        1 g 200 mL/hr over 30 Minutes Intravenous Every 24 hours 09/22/21 1015 09/22/21 1016   09/22/21 1115  meropenem (MERREM) 1 g in sodium chloride 0.9 % 100 mL IVPB        1 g 200 mL/hr over 30  Minutes Intravenous  Once 09/22/21 1016 09/22/21 1230   09/22/21 1115  vancomycin (VANCOREADY) IVPB 1250 mg/250 mL  Status:  Discontinued        1,250 mg 166.7 mL/hr over 90 Minutes Intravenous  Once 09/22/21 1016 09/22/21 1029   09/22/21 1115  vancomycin (VANCOCIN) IVPB 1000 mg/200 mL premix        1,000 mg 200 mL/hr over 60 Minutes Intravenous  Once 09/22/21 1029 09/22/21 1421   09/19/21 1200  vancomycin (VANCOREADY) IVPB 500 mg/100 mL  Status:  Discontinued        500 mg 100 mL/hr over 60 Minutes Intravenous Every T-Th-Sa (Hemodialysis) 09/17/21 1046 09/19/21 0759   09/17/21 0743  metroNIDAZOLE (FLAGYL) 500 MG/100ML IVPB       Note to Pharmacy: Nyoka Cowden D: cabinet override      09/17/21 0743 09/17/21 0827   09/16/21 1200  vancomycin (VANCOREADY) IVPB 750 mg/150 mL  Status:  Discontinued        750 mg 150 mL/hr over 60 Minutes Intravenous Every T-Th-Sa (Hemodialysis) 09/15/21 1748 09/17/21 1046   09/15/21 2100  ceFEPIme (MAXIPIME)  1 g in sodium chloride 0.9 % 100 mL IVPB  Status:  Discontinued        1 g 200 mL/hr over 30 Minutes Intravenous Every 24 hours 09/15/21 1957 09/19/21 0759   09/15/21 1800  ceFEPIme (MAXIPIME) 1 g in sodium chloride 0.9 % 100 mL IVPB  Status:  Discontinued        1 g 200 mL/hr over 30 Minutes Intravenous Every 24 hours 09/15/21 1742 09/15/21 1957   09/15/21 1745  metroNIDAZOLE (FLAGYL) tablet 500 mg  Status:  Discontinued        500 mg Oral Every 12 hours 09/15/21 1725 09/19/21 0759   09/15/21 1615  clindamycin (CLEOCIN) IVPB 600 mg        600 mg 100 mL/hr over 30 Minutes Intravenous  Once 09/15/21 1600 09/15/21 1638   09/15/21 1600  vancomycin (VANCOCIN) IVPB 1000 mg/200 mL premix        1,000 mg 200 mL/hr over 60 Minutes Intravenous  Once 09/15/21 1545 09/15/21 1755   09/15/21 1600  clindamycin (CLEOCIN) IVPB 600 mg  Status:  Discontinued        600 mg 100 mL/hr over 30 Minutes Intravenous  Once 09/15/21 1545 09/15/21 1600        Subjective: Patient seen and examined at the bedside this afternoon.  Blood pressure was soft but she was alert, coherent and was oriented.  Very deconditioned, chronically looking female.  Was not in any kind of distress.  She talked and answered my questions.  She states she wants to eat soup  Objective: Vitals:   10/18/21 1109 10/18/21 1202 10/18/21 1210 10/18/21 1251  BP: (!) 116/43 (!) 92/48 (!) 90/54 (!) 102/58  Pulse: (!) 101 98    Resp:  16    Temp:      TempSrc:      SpO2: 100% 96%    Weight:      Height:        Intake/Output Summary (Last 24 hours) at 10/18/2021 1326 Last data filed at 10/18/2021 1038 Gross per 24 hour  Intake 864.89 ml  Output -1000 ml  Net 1864.89 ml   Filed Weights   10/18/21 0613 10/18/21 0740 10/18/21 1038  Weight: 58 kg 58 kg 58.5 kg    Examination:  General exam: Very deconditioned, chronically ill looking HEENT: PERRL, feeding tube Respiratory system:  no wheezes or crackles  Cardiovascular system: S1 & S2 heard, RRR.  Gastrointestinal system: Abdomen is nondistended, soft and nontender. Central nervous system: Alert and mostly oriented Extremities: No edema, no clubbing ,no cyanosis, AV fistula on the right forearm, right AKA Skin: pressure ulcer as above   Data Reviewed: I have personally reviewed following labs and imaging studies  CBC: Recent Labs  Lab 10/12/21 0805 10/14/21 0218 10/16/21 1407 10/18/21 0650  WBC 11.9* 16.2* 12.5* 12.4*  NEUTROABS 7.9*  --   --   --   HGB 10.2* 8.8* 8.3* 9.3*  HCT 35.6* 29.8* 28.3* 32.1*  MCV 100.6* 98.0 97.9 97.0  PLT 318 286 354 546   Basic Metabolic Panel: Recent Labs  Lab 10/12/21 0805 10/14/21 0218 10/16/21 1406 10/18/21 0650  NA 135 139 134* 139  K 4.8 6.2* 4.0 3.7  CL 96* 101 97* 96*  CO2 27 23 26  32  GLUCOSE 77 51* 145* 150*  BUN 56* 43* 42* 50*  CREATININE 5.79* 5.74* 5.77* 4.70*  CALCIUM 11.2* 10.5* 9.3 10.4*  MG  --   --   --  1.9  PHOS 5.8*  --  6.4* 4.4      No results found for this or any previous visit (from the past 240 hour(s)).   Radiology Studies: No results found.  Scheduled Meds:  aspirin EC  81 mg Oral Daily   atorvastatin  40 mg Oral Daily   Chlorhexidine Gluconate Cloth  6 each Topical Q0600   cinacalcet  90 mg Oral Q supper   clopidogrel  75 mg Oral Daily   darbepoetin (ARANESP) injection - DIALYSIS  200 mcg Intravenous Q Thu-HD   dorzolamide-timolol  1 drop Left Eye BID   feeding supplement (NEPRO CARB STEADY)  960 mL Per Tube Q24H   feeding supplement (PROSource TF)  45 mL Per Tube BID   heparin injection (subcutaneous)  5,000 Units Subcutaneous Q8H   insulin aspart  0-6 Units Subcutaneous Q4H   insulin glargine-yfgn  5 Units Subcutaneous QHS   lidocaine-prilocaine  1 application. Topical UD   mouth rinse  15 mL Mouth Rinse BID   multivitamin  1 tablet Oral QHS   nutrition supplement (JUVEN)  1 packet Per Tube BID BM   pantoprazole sodium  40 mg Oral Daily   senna-docusate  1 tablet Oral BID   sevelamer carbonate  1.6 g Oral TID WC   thiamine  100 mg Oral Daily   Continuous Infusions:  dextrose 5% lactated ringers 30 mL/hr at 10/16/21 0826   lactated ringers Stopped (10/12/21 2239)     LOS: 33 days   Shelly Coss, MD [facility] P3/03/2022, 1:26 PM

## 2021-10-18 NOTE — Assessment & Plan Note (Signed)
continue glargine and sliding scale.  .  Monitor blood sugars. continue current regimen ?

## 2021-10-18 NOTE — Progress Notes (Addendum)
?[facility] ?Progress Note  ? ?Subjective:    ?Seen and examined on HD. Received 4th treatment PLEX 10/17/21. Currently c/o generalized pain. Running even today. ? ?Objective ?Vitals:  ? 10/18/21 0830 10/18/21 0900 10/18/21 0930 10/18/21 1000  ?BP: (!) 93/33 (!) 78/35 97/68 (!) 90/29  ?Pulse: 100 100 99 99  ?Resp:      ?Temp:      ?TempSrc:      ?SpO2:      ?Weight:      ?Height:      ? ?Physical Exam ?General: Chronically ill-appearing young female NAD, eyes closed ?HEENT: Facial swelling significantly improving ?Heart: RRR, no murmurs, gallops, or rubs ?Lungs: CTA nonlabored breathing ?Abdomen: NABS, S, NT ND ?Extremities: No lower extremity edema/right AKA dressing dry clear  ?dialysis  ?Vascular Access: RUA AVF positive bruit, s/p left  IJ trialysis HD cath placed 3/6 ? ?Filed Weights  ? 10/17/21 1709 10/18/21 0613 10/18/21 0740  ?Weight: 58 kg 58 kg 58 kg  ? ? ?Intake/Output Summary (Last 24 hours) at 10/18/2021 1018 ?Last data filed at 10/18/2021 [phone] ?Gross per 24 hour  ?Intake 864.89 ml  ?Output --  ?Net 864.89 ml  ? ? ?Additional Objective ?Labs: ?Basic Metabolic Panel: ?Recent Labs  ?Lab 10/12/21 ?0805 10/14/21 ?6063 10/16/21 ?1406 10/18/21 ?0160  ?NA 135 139 134* 139  ?K 4.8 6.2* 4.0 3.7  ?CL 96* 101 97* 96*  ?CO2 27 23 26  32  ?GLUCOSE 77 51* 145* 150*  ?BUN 56* 43* 42* 50*  ?CREATININE 5.79* 5.74* 5.77* 4.70*  ?CALCIUM 11.2* 10.5* 9.3 10.4*  ?PHOS 5.8*  --  6.4* 4.4  ? ?Liver Function Tests: ?Recent Labs  ?Lab 10/12/21 ?0805 10/16/21 ?1406 10/18/21 ?0650  ?AST  --   --  20  ?ALT  --   --  15  ?ALKPHOS  --   --  59  ?BILITOT  --   --  0.4  ?PROT  --   --  6.1*  ?ALBUMIN 2.9* 2.2* 2.6*  ? ?No results for input(s): LIPASE, AMYLASE in the last 168 hours. ?CBC: ?Recent Labs  ?Lab 10/12/21 ?0805 10/14/21 ?1093 10/16/21 ?1407 10/18/21 ?2355  ?WBC 11.9* 16.2* 12.5* 12.4*  ?NEUTROABS 7.9*  --   --   --   ?HGB 10.2* 8.8* 8.3* 9.3*  ?HCT 35.6* 29.8* 28.3* 32.1*  ?MCV 100.6* 98.0 97.9 97.0  ?PLT 318 286  354 320  ? ?Blood Culture ?   ?Component Value Date/Time  ? SDES BLOOD LEFT HAND 09/22/2021 1554  ? SPECREQUEST  09/22/2021 1554  ?  BOTTLES DRAWN AEROBIC AND ANAEROBIC Blood Culture results may not be optimal due to an inadequate volume of blood received in culture bottles  ? CULT  09/22/2021 1554  ?  NO GROWTH 5 DAYS ?Performed at [facility], [place] [address], [place], [place] [address] ?  ? REPTSTATUS 09/27/2021 FINAL 09/22/2021 1554  ? ? ?Cardiac Enzymes: ?No results for input(s): CKTOTAL, CKMB, CKMBINDEX, TROPONINI in the last 168 hours. ?CBG: ?Recent Labs  ?Lab 10/17/21 ?1932 10/17/21 ?2032 10/17/21 ?2330 10/18/21 ?0419 10/18/21 ?2542  ?GLUCAP 188* 189* 195* 127* 169*  ? ?Iron Studies: No results for input(s): IRON, TIBC, TRANSFERRIN, FERRITIN in the last 72 hours. ?Lab Results  ?Component Value Date  ? INR 1.2 08/10/2021  ? INR 1.1 06/30/2020  ? ?Studies/Results: ?No results found. ? ?Medications: ? sodium chloride    ? sodium chloride    ? citrate dextrose Stopped (10/17/21 1900)  ? dextrose  5% lactated ringers 30 mL/hr at 10/16/21 0826  ? lactated ringers Stopped (10/12/21 2239)  ? ? aspirin EC  81 mg Oral Daily  ? atorvastatin  40 mg Oral Daily  ? Chlorhexidine Gluconate Cloth  6 each Topical Q0600  ? cinacalcet  90 mg Oral Q supper  ? clopidogrel  75 mg Oral Daily  ? darbepoetin (ARANESP) injection - DIALYSIS  200 mcg Intravenous Q Thu-HD  ? dorzolamide-timolol  1 drop Left Eye BID  ? feeding supplement (NEPRO CARB STEADY)  960 mL Per Tube Q24H  ? feeding supplement (PROSource TF)  45 mL Per Tube BID  ? heparin injection (subcutaneous)  5,000 Units Subcutaneous Q8H  ? insulin aspart  0-6 Units Subcutaneous Q4H  ? insulin glargine-yfgn  5 Units Subcutaneous QHS  ? lidocaine-prilocaine  1 application. Topical UD  ? mouth rinse  15 mL Mouth Rinse BID  ? multivitamin  1 tablet Oral QHS  ? nutrition supplement (JUVEN)  1 packet Per Tube BID BM  ? pantoprazole sodium  40 mg Oral Daily  ? senna-docusate  1  tablet Oral BID  ? sevelamer carbonate  1.6 g Oral TID WC  ? thiamine  100 mg Oral Daily  ? ? ?Dialysis Orders: ?[facility] ?3h 32min   400/ 600   59.5kg   2/2 bath  Prof 2   RU AV fistula  Hep 4000  ?Mircera 200 mcg IV every 2 weeks - last 1/26 ?Ancef 2g 3 times weekly HD ?Covid neg 2/3, hep B SAg neg 2/4 ? ?Assessment/Plan: ?Embolic stroke with AMS -neuro following, acute embolic CVA / Repeat MRI with no acute findings.  With PFO on TTE. TEE showed small PFO and no clot. Blood Cx 2/4 negative. S/p course of IV abx.  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 Now receiving PLEX today (4th treatment). 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 Plan for LP after 5 days off of ASA and Plavix. Getting PLEX QOD. 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 Please see #12 for alternative access. 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 Trula Slade [facility] following.  To go to CIR at some point.  ?ESRD - Usually TTS schedule - Currently off schedule. Tolerated yesterday's HD with minimal UF d/t episodes of hypotension. Last CXR unremarkable and not showing signs for volume overload. May be dry-running even with HD today. Trying to keep SBP > 100 on HD due to #1. Plan for PLEX 3/9 (5th treatment). Continue to monitor behavior during HD and tolerate dialysis in chair before dc. ?HTN/volume - noted episodes of hypotension during HD,  improved with fluid /midodrine with HD. Below prior EDW, will need to be lowered on d/c. ?Sepsis/ shock - resolved  ?Anemia of CKD- last Hgb stable 8.3. Continue Aranesp 244mcg q Thurs. Transfuse pRBC prn.  ?MBD: CorrCa high, Phos ok. Continue home sensipar/binders, hold VDRA for now.   ?Severe protein calorie malnutrition: NG tube in place with Nepro TF's + supplements. ?DMT2 - per PMD  ?Access: LUE AVF with some eschar-  sticking away from now, monitor closely ?Facial edema- AM of 10/15/21--> ? Delayed reaction to PLEX with FFP vs SVC- Mar reviewed, no new meds. Received Solumedrol/ benadryl/ famotidine, R EJ catheter removed.  D/w hospitalist.  Since pt has had her AKA and  no bleeding > 48 hrs out, OK to convert PLEX to albumin replacement fluid. S/p L IJ trialysis HD cath in IR 3/6 by Dr. Vernard Gambles ?Dispo - likely will need SNF placement/versus CIR ? ?Tobie Poet, NP ?[facility] ?10/18/2021,10:18 AM ? LOS: 33 days  ?  ?

## 2021-10-18 NOTE — Progress Notes (Signed)
PT Cancellation Note ? ?Patient Details ?Name: [name] ?MRN: [id number] ?DOB: [birth year] ? ? ?Cancelled Treatment:    Reason Eval/Treat Not Completed: Patient at procedure or test/unavailable (HD). Will follow-up for PT treatment this afternoon as schedule permits; per discussion yesterday, pt seemed motivated for OOB mobility trials today. ? ?Mabeline Caras, PT, DPT ?Acute Rehabilitation Services  ?Pager [phone] ?Office [phone] ? ?Derry Lory ?10/18/2021, 7:50 AM ? ? ?

## 2021-10-19 DIAGNOSIS — G9340 Encephalopathy, unspecified: Secondary | ICD-10-CM | POA: Diagnosis not present

## 2021-10-19 DIAGNOSIS — I6340 Cerebral infarction due to embolism of unspecified cerebral artery: Secondary | ICD-10-CM | POA: Diagnosis not present

## 2021-10-19 LAB — GLUCOSE, CAPILLARY
Glucose-Capillary: 180 mg/dL — ABNORMAL HIGH (ref 70–99)
Glucose-Capillary: 185 mg/dL — ABNORMAL HIGH (ref 70–99)
Glucose-Capillary: 192 mg/dL — ABNORMAL HIGH (ref 70–99)
Glucose-Capillary: 192 mg/dL — ABNORMAL HIGH (ref 70–99)
Glucose-Capillary: 196 mg/dL — ABNORMAL HIGH (ref 70–99)
Glucose-Capillary: 220 mg/dL — ABNORMAL HIGH (ref 70–99)
Glucose-Capillary: 226 mg/dL — ABNORMAL HIGH (ref 70–99)
Glucose-Capillary: 239 mg/dL — ABNORMAL HIGH (ref 70–99)

## 2021-10-19 LAB — BASIC METABOLIC PANEL
Anion gap: 8 (ref 5–15)
BUN: 33 mg/dL — ABNORMAL HIGH (ref 6–20)
CO2: 31 mmol/L (ref 22–32)
Calcium: 9.4 mg/dL (ref 8.9–10.3)
Chloride: 98 mmol/L (ref 98–111)
Creatinine, Ser: 3.76 mg/dL — ABNORMAL HIGH (ref 0.44–1.00)
GFR, Estimated: 15 mL/min — ABNORMAL LOW (ref >60)
Glucose, Bld: 207 mg/dL — ABNORMAL HIGH (ref 70–99)
Potassium: 3.5 mmol/L (ref 3.5–5.1)
Sodium: 137 mmol/L (ref 135–145)

## 2021-10-19 MED ORDER — DARBEPOETIN ALFA 200 MCG/0.4ML IJ SOSY
200.0000 ug | PREFILLED_SYRINGE | INTRAMUSCULAR | Status: DC
  Administered 2021-10-20: 200 ug via INTRAVENOUS
  Filled 2021-10-19 (×2): qty 0.4

## 2021-10-19 MED ORDER — ACD FORMULA A 0.73-2.45-2.2 GM/100ML VI SOLN: 1000.0000 mL | Status: DC

## 2021-10-19 MED ORDER — ANTICOAGULANT SODIUM CITRATE 4% (200MG/5ML) IV SOLN
5.0000 mL | Freq: Once | Status: DC
  Filled 2021-10-19: qty 5

## 2021-10-19 MED ORDER — NYSTATIN 100000 UNIT/ML MT SUSP
5.0000 mL | Freq: Four times a day (QID) | OROMUCOSAL | Status: DC
  Administered 2021-10-19 – 2021-10-25 (×16): 500000 [IU] via ORAL
  Filled 2021-10-19 (×19): qty 5

## 2021-10-19 MED ORDER — CALCIUM GLUCONATE-NACL 2-0.675 GM/100ML-% IV SOLN
2.0000 g | Freq: Once | INTRAVENOUS | Status: AC
  Administered 2021-10-19: 17:00:00 2000 mg via INTRAVENOUS

## 2021-10-19 MED ORDER — LIDOCAINE VISCOUS HCL 2 % MT SOLN
15.0000 mL | Freq: Four times a day (QID) | OROMUCOSAL | Status: DC | PRN
  Filled 2021-10-19: qty 15

## 2021-10-19 MED ORDER — INSULIN ASPART 100 UNIT/ML IJ SOLN
0.0000 [IU] | INTRAMUSCULAR | Status: DC
  Administered 2021-10-20: 3 [IU] via SUBCUTANEOUS
  Administered 2021-10-20: 01:00:00 2 [IU] via SUBCUTANEOUS
  Administered 2021-10-20: 13:00:00 1 [IU] via SUBCUTANEOUS
  Administered 2021-10-23: 2 [IU] via SUBCUTANEOUS
  Administered 2021-10-25 (×2): 1 [IU] via SUBCUTANEOUS

## 2021-10-19 MED ORDER — CALCIUM GLUCONATE-NACL 2-0.675 GM/100ML-% IV SOLN
INTRAVENOUS | Status: AC
  Administered 2021-10-19: 17:00:00 2000 mg
  Filled 2021-10-19: qty 200

## 2021-10-19 MED ORDER — ACETAMINOPHEN 325 MG PO TABS
650.0000 mg | ORAL_TABLET | ORAL | Status: DC | PRN
  Administered 2021-10-20: 650 mg via ORAL
  Filled 2021-10-19 (×2): qty 2

## 2021-10-19 MED ORDER — DIPHENHYDRAMINE HCL 25 MG PO CAPS
25.0000 mg | ORAL_CAPSULE | Freq: Four times a day (QID) | ORAL | Status: DC | PRN
  Administered 2021-10-19: 22:00:00 25 mg via ORAL
  Filled 2021-10-19: qty 1

## 2021-10-19 MED ORDER — ACD FORMULA A 0.73-2.45-2.2 GM/100ML VI SOLN
Status: AC
  Administered 2021-10-19: 17:00:00 1000 mL
  Filled 2021-10-19: qty 1000

## 2021-10-19 NOTE — Care Management (Signed)
ED RNCM  was contacted by [facility] house coverage concerning patient's 41 yo left in room with patient and no one to get child home.  RNCM went to room to meet with patient about someone picking up the child.  Met with child and child's father as per patient and child.  According to dad he has been waiting for patient's sister to drop off child at home for hours and decided to come to hospital. Patient has consented that child should go home with dad.  Child left hospital with dad. Updated AC .  ?

## 2021-10-19 NOTE — Progress Notes (Signed)
Occupational Therapy Treatment Patient Details Name: [name] MRN: [id number] DOB: 28-Jan-1981 Today's Date: 10/19/2021   History of present illness Pt is a 41 y.o. female admitted 09/15/21 post-HD session with concern for AMS when pt stopped speaking to staff. 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 Pt with increased somnolence 2/6; noted to be more lethargic in HD 2/7, 2/9, 2/11. Head CT 2/10 negative for acute injury; chronic small vessel disease and infarcts. MRI 2/10 with scattered punctate acute infarcts bilateral cerebral hemispheres (may be embolic), potential subacute infarct L corona radiata. Pt with persistent bilateral ptosis (pt reports ongoing ~1 month). MRI orbits 2/25 with evolution of infarcts seen on prior study; no acute infarct; chronic R retinal detachment. Per neurology 2/28, high suspicion for miller fischer GBS; to proceed with PLEX treatment. S/p R IJ temporary HD cath placement 3/1. 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 Pt with worsening face/throat swelling; s/p R IJ removal 3/5. S/p L IJ HD cath 3/6. PMH includes ESRD (HD TTS), DM2, HTN, neuropathy, R eye blindness (2008).   OT comments  Pt self reported about session with Physical Therapy and was very happy on what they were able to complete. Pt at this time reported feeling tired from session and also from pain medications. Pt complete bed mobility to L side with mod assist and use of rail. Pt did not want to complete EOB activity due to session prior but complete bed level UE AROM and hand strengthening. Pt noted in session was able to grasp onto suction with L hand several times in session once cued on location then was able to complete post set up.    Recommendations for follow up therapy are one component of a multi-disciplinary discharge planning process, led by the attending physician.  Recommendations may be updated based on patient status, additional functional criteria and insurance  authorization.    Follow Up Recommendations  Acute inpatient rehab (3hours/day)    Assistance Recommended at Discharge Frequent or constant Supervision/Assistance  Patient can return home with the following  A lot of help with walking and/or transfers;A lot of help with bathing/dressing/bathroom;Assistance with cooking/housework;Assistance with feeding;Direct supervision/assist for medications management;Direct supervision/assist for financial management;Assist for transportation   Equipment Recommendations  None recommended by OT    Recommendations for Other Services      Precautions / Restrictions Precautions Precautions: Fall;Other (comment) Precaution Comments: R BKA now R AKA; bilateral ptosis with blindness; bowel incontinence; PLEX treatment every other day (cannot mobilize while running per neuro MD) Required Braces or Orthoses: Other Brace Other Brace: RLE limb guard Restrictions Weight Bearing Restrictions: Yes RUE Weight Bearing: Weight bearing as tolerated RLE Weight Bearing: Non weight bearing       Mobility Bed Mobility Overal bed mobility: Needs Assistance Bed Mobility: Rolling Rolling: Mod assist              Transfers                         Balance                                           ADL either performed or assessed with clinical judgement   ADL Overall ADL's : Needs assistance/impaired Eating/Feeding: NPO   Grooming: Wash/dry hands;Wash/dry face;Cueing for safety;Cueing for sequencing;Sitting;Set up;Bed level   Upper Body Bathing: Maximal  assistance;Cueing for safety;Cueing for sequencing;Sitting;Bed level   Lower Body Bathing: Total assistance;Bed level   Upper Body Dressing : Maximal assistance;Bed level   Lower Body Dressing: Total assistance   Toilet Transfer: Total assistance Toilet Transfer Details (indicate cue type and reason): Pt starting to be able to report when they had a BM                 Extremity/Trunk Assessment Upper Extremity Assessment Upper Extremity Assessment: RUE deficits/detail RUE Deficits / Details: edema noted upper arm RUE Coordination: decreased fine motor;decreased gross motor LUE Deficits / Details: Pt was able to hold onto suction and complete in session with no assist once able to locate   Lower Extremity Assessment Lower Extremity Assessment: Defer to PT evaluation RLE Deficits / Details: Residual limb wrapped and limb protector donned        Vision   Additional Comments: Pt is blind   Perception Perception Perception: Impaired   Praxis Praxis Praxis: Impaired Praxis Impairment Details: Initiation;Motor planning    Cognition Arousal/Alertness: Awake/alert Behavior During Therapy: Flat affect Overall Cognitive Status: Difficult to assess Area of Impairment: Attention, Following commands, Safety/judgement, Awareness, Problem solving                 Orientation Level: Place, Time, Situation Current Attention Level: Sustained Memory: Decreased recall of precautions, Decreased short-term memory Following Commands: Follows one step commands with increased time Safety/Judgement: Decreased awareness of safety, Decreased awareness of deficits Awareness: Emergent Problem Solving: Slow processing, Difficulty sequencing, Requires verbal cues, Requires tactile cues General Comments: Pt was very happy about session prior but reporting having some fatigue now        Exercises Exercises: General Upper Extremity General Exercises - Upper Extremity Shoulder Flexion: AROM, Both, 5 reps, Supine Elbow Flexion: AROM, 5 reps, Both Elbow Extension: AROM, Both, 5 reps Digit Composite Flexion: AROM, Strengthening, 10 reps, Both    Shoulder Instructions       General Comments      Pertinent Vitals/ Pain       Pain Assessment Pain Assessment: Faces Faces Pain Scale: Hurts a little bit Pain Location: R hand, buttocks with pericare,  RLE Pain Descriptors / Indicators: Discomfort, Grimacing, Guarding Pain Intervention(s): Monitored during session  Home Living                                          Prior Functioning/Environment              Frequency  Min 2X/week        Progress Toward Goals  OT Goals(current goals can now be found in the care plan section)  Progress towards OT goals: Progressing toward goals  Acute Rehab OT Goals Patient Stated Goal: to be able to keep doing therapy OT Goal Formulation: With patient Time For Goal Achievement: 11/02/21 Potential to Achieve Goals: Fair ADL Goals Pt Will Perform Eating: with set-up;sitting;with adaptive utensils Pt Will Perform Grooming: with modified independence;sitting;bed level Pt Will Perform Upper Body Bathing: with supervision;sitting Pt Will Perform Lower Body Bathing: with min assist;sit to/from stand Pt Will Perform Upper Body Dressing: with supervision;sitting Pt Will Perform Lower Body Dressing: with mod assist;sitting/lateral leans Pt Will Transfer to Toilet: with mod assist;stand pivot transfer;bedside commode Pt Will Perform Toileting - Clothing Manipulation and hygiene: sitting/lateral leans;with caregiver independent in assisting;with modified independence Additional ADL Goal #1: Pt will be able  to sit EOB with supervision Additional ADL Goal #2: Pt will be alert and oriented x4 without cues  Plan      Co-evaluation                 AM-PAC OT "6 Clicks" Daily Activity     Outcome Measure   Help from another person eating meals?: Total Help from another person taking care of personal grooming?: A Little Help from another person toileting, which includes using toliet, bedpan, or urinal?: Total Help from another person bathing (including washing, rinsing, drying)?: A Lot Help from another person to put on and taking off regular upper body clothing?: Total Help from another person to put on and taking off  regular lower body clothing?: Total 6 Click Score: 9    End of Session    OT Visit Diagnosis: Other abnormalities of gait and mobility (R26.89);Unsteadiness on feet (R26.81);Muscle weakness (generalized) (M62.81);Other symptoms and signs involving cognitive function;Low vision, both eyes (H54.2) Pain - Right/Left: Right Pain - part of body: Ankle and joints of foot   Activity Tolerance Patient limited by fatigue   Patient Left in bed;with call bell/phone within reach;with bed alarm set   Nurse Communication Mobility status;Other (comment)        Time: 0100-7121 OT Time Calculation (min): 19 min  Charges: OT General Charges $OT Visit: 1 Visit OT Treatments $Self Care/Home Management : 8-22 mins  Joeseph Amor OTR/L  Acute Rehab Services  [phone] office number [phone] pager number   Joeseph Amor 10/19/2021, 12:04 PM

## 2021-10-19 NOTE — Progress Notes (Signed)
Neurology Progress Note ? ?Brief HPI:  ?[name] is a 41 y.o. female with a medical history significant for ESRD on HD T/Th/Sat, type 2 diabetes mellitus with neuropathy, hypertension, right eye blindness, and medical non-adherence who presented to the ED due to lethargy and AMS following HD on 2/3.  ? ?Subjective: ?Patient underwent R AKA 3/1 given lack of healing of R BKA.  ?She was started on PLEX 3/1 with sessions on 3/3, 3/4, and 3/7. Session 5/5 to be complete today.  ?Patient remains unable to open her eyes on examination.  ? ?Exam: ?Vitals:  ? 10/19/21 0817 10/19/21 1000  ?BP: (!) 141/67 118/72  ?Pulse: 100   ?Resp: 14   ?Temp: 99 ?F (37.2 ?C)   ?SpO2: 100%   ? ?Labs: ?Ganglioside Ab panel + MoG Ab --pending ? ?Gen: Laying comfortably in bed, in no acute distress ?Resp: non-labored breathing, no respiratory distress on room air. Patient has a weak cough and is using her Yankauer to suction secretions.  ?Abd: soft, non-tender, non-distended ? ?Neuro: ?Mental status: awake and alert on examiner entry, able to follow commands throughout.  ?She is oriented throughout to orientation questions.  ?Speech is fluent without dysarthria.  ?Cranial Nerves:  ?Profound ptosis: unable to open eyelids. With passive eye opening, she is able to track examiner's hand in her left eye left visual field. Her gaze is midline and left but she is unable to cross midline towards the right with the left eye. 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  ?Facial diplegia noted. ?Pupils are non-reactive  ?Intact tongue protrusion ?Motor: Patient is able to elevate all extremities antigravity without vertical drift or asymmetry.  ?Sensation: Facial sensation intact bilaterally ?Gait: Deferred ? ?Assessment: 41 y.o. female with history of ESRD on HD T/Th/Sat, type 2 diabetes mellitus with neuropathy, hypertension, osteomyelitis with BKA, right eye blindness, and medical non-adherence presenting with lethargy and AMS following HD on 2/3. Initial  work up in the ED revealed patient with leukocytosis concerning for infection and imaging with concern for osteomyelitis for which vascular surgery recommended admission for BKA on 2/5 and ongoing antibiotics. Of note, patient was discharged in January following urgent transmetatarsal amputation after being found with gangrenous toes s/p wound vac and IV Ancef after wound cultures grew MSSA. During hospitalization, patient's mental status initially improved and she was responding appropriately on evaluation on 2/4 with surgery for BKA on 2/5. On 2/6, she was noted to be somnolent and slow to respond but she was answering questions appropriately. On 2/7 patient had an episode of hypotension with HD but was noted to still be lethargic but followed commands and answered questions appropriately. Patient again had HD on 2/9 and became more lethargic, patient lethargic on exam during HD on 2/11. MRI brain with scattered acute infarcts throughout both hemispheres. TEE completed- no thrombus detected, small PFO noted. No longer encephalopathic. Inpatient stroke work up completed.    ?  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 She states that this has been ongoing for about the past month. She endorses total loss of vision in her right eye that is and severe loss of vision in her left eye which she estimates has been present for about 1 month as well. She follows all simple commands and answers question appropriately. Her sister reports however that this only started about a week or so ago  while she was inhospital. ?Exam confirms severe bilateral ptosis that localizes to the 3rd cranial nerve. Bilateral pupils oblong and 3 mm, non reactive. She has opthalmoplegia, and dysconjugate gaze. ?Cavernous sinus imaging was negative. ?DDx: Eye opening apraxia is felt to be  unlikely. Cranial polyneuropathy including CN III IV VI is possible. Miller-Fisher variant of GBS is felt to be higher on the DDx given negative MRI brain and orbits (see below). Atypical predominantly bulbar presentation of AIDP is also possible; her BLE weakness and hyporeflexia further support this possibility. Critical illness neuropathy/myopathy would not be expected to present disproportionately with eye involvement but this is also possible. Bulbar myopathy or idiopathic cranial polyneuropathy also on the DDx. Botulism and tick paralysis are unlikely as she has not had any dyspnea; however, the pupil in her left eye has no discernible reactivity, which would be expected for botulism (right eye with prior retinal detachment) and she also has swallowing difficulty requiring pureed diet. An atypical presentation of myasthenia gravis is also possible. NMO is on the DDx given BLE weakness and vision loss.  ?MRI brain with and without contrast performed 2/25: Repeat MRI brain with and without contrast (2/25): There is expected evolution of the  infarcts seen on the prior study. No acute infarct. Stable chronic infarcts and chronic microvascular ischemic changes. Scattered chronic microhemorrhages are again noted. Ventricles are stable in size. No intracranial mass or abnormal enhancement to explain the patient's bilateral ptosis and ophthalmoplegia. Cavernous sinus enhancement is symmetric and unremarkable. Persistent left greater than right mastoid fluid opacification. Sella is unremarkable. ?MRI orbits with and without contrast performed 2/25: Bilateral lens replacements. Chronic right retinal detachment. Extraocular muscles are symmetric and unremarkable. No abnormal enhancement of the optic nerve sheath complexes.  ? ?Impression: Given no cavernous sinus pathology identified on MRI to explain the patient's severe bilateral ptosis and ophthalmoparesis, rare potential etiologies will now need to be considered (in  addition to above DDx). 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 Her ophthalmoparesis and possible cognitive impairment on exam (she is poorly cooperative, making determination of cognitive impairment difficult) would make thiamine deficiency more likely. Unfortunately, ataxia cannot be tested as she is noncompliant with this and several other components of the exam. Will need thiamine level followed by empiric thiamine. We were initially planning an LP but given her severe peripheral vascular disease requiring below knee amputation and the fact that she had to be taken to OR again for above knee amputation, we do not want to hold her dual antiplatelet for the LP. ?Other work up for her ptosis and ophthalmoparesis is beyond the scope of this inpatient admission and she will need to be evaluated outpatient by a Neurophthalmologist (Dr. Michel Bickers of [facility] is recommended: [phone]). ?  ?Recommendations: ?- Continue PLEX with FFP replacement. 5/5 to be completed today 3/9 ?- Will need outpatient EMG/NCS after discharge. Will need outpatient Neurology follow up with [facility] for this. ?- Consider 30 day cardiac event monitoring as outpatient for the strokes. ?- Follow up with Neuro-opthalmologist (Dr. Michel Bickers of [facility] is recommended: [phone]). ? ?Anibal Henderson, AGACNP-BC ?[facility] ?[phone] ? ?Attending Neurohospitalist Addendum ?Patient seen and examined with APP/Resident. ?Agree with the history and physical as documented above. ?Agree with the plan as documented, which I helped formulate. ?I have independently reviewed the chart, obtained history, review of systems and examined the patient.I have personally reviewed pertinent head/neck/spine imaging (CT/MRI). ?Please  feel free to call with any questions. ? ?-- ?Amie Portland,  MD ?Neurologist ?[facility] ?Pager: [phone] ?

## 2021-10-19 NOTE — Progress Notes (Signed)
Physical Therapy Treatment ?Patient Details ?Name: [name] ?MRN: [id number] ?DOB: 09/18/1980 ?Today's Date: 10/19/2021 ? ? ?History of Present Illness Pt is a 41 y.o. female admitted 09/15/21 post-HD session with concern for AMS when pt stopped speaking to staff. 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 Pt with increased somnolence 2/6; noted to be more lethargic in HD 2/7, 2/9, 2/11. Head CT 2/10 negative for acute injury; chronic small vessel disease and infarcts. MRI 2/10 with scattered punctate acute infarcts bilateral cerebral hemispheres (may be embolic), potential subacute infarct L corona radiata. Pt with persistent bilateral ptosis (pt reports ongoing ~1 month). MRI orbits 2/25 with evolution of infarcts seen on prior study; no acute infarct; chronic R retinal detachment. Per neurology 2/28, high suspicion for miller fischer GBS; to proceed with PLEX treatment. S/p R IJ temporary HD cath placement 3/1. 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 Pt with worsening face/throat swelling; s/p R IJ removal 3/5. S/p L IJ HD cath 3/6. PMH includes ESRD (HD TTS), DM2, HTN, neuropathy, R eye blindness (2008). ?  ?PT Comments  ? ? Pt progressing with mobility, motivated to participate this session and pleased with progress. Pt tolerated prolonged seated EOB activity and standing trials with stedy. Pt able to clear buttocks, but unable to achieve upright despite maxA+2; will trial additional lift equipment next session. Pt alert, pleasant and interactive. Pt remains limited by generalized weakness, decreased activity tolerance, poor balance strategies/postural reactions and impaired cognition. Feel she remains an appropriate candidate for intensive CIR-level therapies to maximize functional mobility and decrease caregiver burden; pt benefits from having consistent therapists and scheduled visits. ?   ?Recommendations for follow up therapy are one component of a multi-disciplinary discharge planning  process, led by the attending physician.  Recommendations may be updated based on patient status, additional functional criteria and insurance authorization. ? ?Follow Up Recommendations ? Acute inpatient rehab (3hours/day) ?  ?  ?Assistance Recommended at Discharge Frequent or constant Supervision/Assistance  ?Patient can return home with the following Assistance with cooking/housework;Assistance with feeding;Direct supervision/assist for medications management;Direct supervision/assist for financial management;Assist for transportation;Help with stairs or ramp for entrance;A lot of help with bathing/dressing/bathroom;A lot of help with walking and/or transfers ?  ?Equipment Recommendations ? BSC/3in1;Wheelchair (measurements PT);Wheelchair cushion (measurements PT);Hospital bed;Other (comment) (lift equipment)  ?  ?Recommendations for Other Services   ? ? ?  ?Precautions / Restrictions Precautions ?Precautions: Fall;Other (comment) ?Precaution Comments: R BKA now R AKA; bilateral ptosis with blindness; bowel incontinence; PLEX treatment every other day (cannot mobilize while running per neuro MD) ?Required Braces or Orthoses: Other Brace ?Other Brace: RLE limb guard ?Restrictions ?Weight Bearing Restrictions: Yes ?RUE Weight Bearing: Weight bearing as tolerated ?RLE Weight Bearing: Non weight bearing  ?  ? ?Mobility ? Bed Mobility ?Overal bed mobility: Needs Assistance ?Bed Mobility: Rolling, Sidelying to Sit, Sit to Supine ?Rolling: Mod assist, Min assist ?Sidelying to sit: Mod assist, +2 for safety/equipment ?  ?Sit to supine: Max assist, +2 for safety/equipment ?  ?General bed mobility comments: initially rolling L/R for pericare due to bowel incontinence, from R-sidelying able to come to sitting with modA for trunk elevation; maxA for return to supine due to fatigue; assist +2 helpful for lines, safety and repositioning ?  ? ?Transfers ?Overall transfer level: Needs assistance ?Equipment used: Ambulation  equipment used (stedy) ?Transfers: Sit to/from Stand ?Sit to Stand: Max assist, +2 physical assistance, From elevated surface ?  ?  ?  ?  ?  ?General  transfer comment: pt tolerated 4x standing trials with stedy frame, able to clear buttocks from elevated bed with maxA+2 but difficulty with trunk/hip extension to achieve upright; despite fatigue, pt motivated with improving effort each trial ?  ? ?Ambulation/Gait ?  ?  ?  ?  ?  ?  ?  ?  ? ? ?Stairs ?  ?  ?  ?  ?  ? ? ?Wheelchair Mobility ?  ? ?Modified Rankin (Stroke Patients Only) ?  ? ? ?  ?Balance Overall balance assessment: Needs assistance ?Sitting-balance support: Feet supported, Bilateral upper extremity supported, Single extremity supported ?Sitting balance-Leahy Scale: Poor ?Sitting balance - Comments: Demonstrates significant improvement in tolerance to sitting EOB; initially requiring modA to maintain midline, bumped down to L elbow then able to maintain seated balance without assist, reliant on UE support ?  ?  ?Standing balance-Leahy Scale: Zero ?  ?  ?  ?  ?  ?  ?  ?  ?  ?  ?  ?  ?  ? ?  ?Cognition Arousal/Alertness: Awake/alert ?Behavior During Therapy: Flat affect ?Overall Cognitive Status: Difficult to assess ?Area of Impairment: Attention, Following commands, Safety/judgement, Awareness, Problem solving ?  ?  ?  ?  ?  ?  ?  ?  ?  ?Current Attention Level: Sustained ?Memory: (P) Decreased recall of precautions, Decreased short-term memory ?Following Commands: Follows one step commands with increased time ?Safety/Judgement: Decreased awareness of safety, Decreased awareness of deficits ?Awareness: Emergent ?Problem Solving: Slow processing, Difficulty sequencing, Requires verbal cues, Requires tactile cues ?General Comments: pt motivated to participate, demonstrates improving awareness and ability to communicate needs; improved effort with activity ?  ?  ? ?  ?Exercises   ? ?  ?General Comments General comments (skin integrity, edema, etc.):  demonstrates improving tolerance to sitting EOB - feel she would tolerate OOB to chair via maximove lift next session ?  ?  ? ?Pertinent Vitals/Pain Pain Assessment ?Pain Assessment: Faces ?Faces Pain Scale: Hurts even more ?Pain Location: R hand, throat, buttocks w/ pericare ?Pain Descriptors / Indicators: Discomfort, Grimacing ?Pain Intervention(s): Monitored during session, Repositioned  ? ? ?Home Living   ?  ?  ?  ?  ?  ?  ?  ?  ?  ?   ?  ?Prior Function    ?  ?  ?   ? ?PT Goals (current goals can now be found in the care plan section) Progress towards PT goals: Progressing toward goals ? ?  ?Frequency ? ? ? Min 4X/week ? ? ? ?  ?PT Plan Current plan remains appropriate  ? ? ?Co-evaluation   ?  ?  ?  ?  ? ?  ?AM-PAC PT "6 Clicks" Mobility   ?Outcome Measure ? Help needed turning from your back to your side while in a flat bed without using bedrails?: A Lot ?Help needed moving from lying on your back to sitting on the side of a flat bed without using bedrails?: A Lot ?Help needed moving to and from a bed to a chair (including a wheelchair)?: Total ?Help needed standing up from a chair using your arms (e.g., wheelchair or bedside chair)?: Total ?Help needed to walk in hospital room?: Total ?Help needed climbing 3-5 steps with a railing? : Total ?6 Click Score: 8 ? ?  ?End of Session Equipment Utilized During Treatment: Gait belt ?Activity Tolerance: Patient tolerated treatment well ?Patient left: in bed;with call bell/phone within reach;with bed alarm set ?Nurse Communication: Mobility  status ?PT Visit Diagnosis: Other abnormalities of gait and mobility (R26.89);Muscle weakness (generalized) (M62.81);Difficulty in walking, not elsewhere classified (R26.2);Other symptoms and signs involving the nervous system (R29.898) ?  ? ? ?Time: 8115-7262 ?PT Time Calculation (min) (ACUTE ONLY): 40 min ? ?Charges:  $Therapeutic Activity: 38-52 mins          ?          ? ?Mabeline Caras, PT, DPT ?Acute Rehabilitation Services   ?Pager [phone] ?Office [phone] ? ?Derry Lory ?10/19/2021, 1:05 PM ? ?

## 2021-10-19 NOTE — Progress Notes (Signed)
Pt completed Therapeutic Plasma Exchange treatment without issue.Pt tolerated well. ?

## 2021-10-19 NOTE — Progress Notes (Signed)
?[facility] ?Progress Note  ? ?Subjective: Seen in room. She is somnolent. Groans to touch and continues to sleep. Next HD 10/20/2021   ? ?Objective ?Vitals:  ? 10/19/21 0814 10/19/21 0817 10/19/21 1000 10/19/21 1342  ?BP: (!) 117/39 (!) 141/67 118/72 126/61  ?Pulse: 97 100  94  ?Resp: 19 14  20   ?Temp: 99 ?F (37.2 ?C) 99 ?F (37.2 ?C)  98 ?F (36.7 ?C)  ?TempSrc: Oral Oral  Oral  ?SpO2: 100% 100%  94%  ?Weight:      ?Height:      ? ?Physical Exam ?General: Chronically ill appearing female in NAD ?Heart: S1,S2 no M/R/G. SR 90s.  ?Lungs: CTAB ?Abdomen: obese. Cortrak FT in place.  ?Extremities: R AKA staples intact. No LLE edema ?Dialysis Access: R AVF + T/B upper arm appears edematous. Temp cath intact [facility].  ? ? ? ?Additional Objective ?Labs: ?Basic Metabolic Panel: ?Recent Labs  ?Lab 10/16/21 ?1406 10/18/21 ?0650 10/19/21 ?0144  ?NA 134* 139 137  ?K 4.0 3.7 3.5  ?CL 97* 96* 98  ?CO2 26 32 31  ?GLUCOSE 145* 150* 207*  ?BUN 42* 50* 33*  ?CREATININE 5.77* 4.70* 3.76*  ?CALCIUM 9.3 10.4* 9.4  ?PHOS 6.4* 4.4  --   ? ?Liver Function Tests: ?Recent Labs  ?Lab 10/16/21 ?1406 10/18/21 ?0650  ?AST  --  20  ?ALT  --  15  ?ALKPHOS  --  59  ?BILITOT  --  0.4  ?PROT  --  6.1*  ?ALBUMIN 2.2* 2.6*  ? ?No results for input(s): LIPASE, AMYLASE in the last 168 hours. ?CBC: ?Recent Labs  ?Lab 10/14/21 ?0218 10/16/21 ?1407 10/18/21 ?0650  ?WBC 16.2* 12.5* 12.4*  ?HGB 8.8* 8.3* 9.3*  ?HCT 29.8* 28.3* 32.1*  ?MCV 98.0 97.9 97.0  ?PLT 286 354 320  ? ?Blood Culture ?   ?Component Value Date/Time  ? SDES BLOOD LEFT HAND 09/22/2021 1554  ? SPECREQUEST  09/22/2021 1554  ?  BOTTLES DRAWN AEROBIC AND ANAEROBIC Blood Culture results may not be optimal due to an inadequate volume of blood received in culture bottles  ? CULT  09/22/2021 1554  ?  NO GROWTH 5 DAYS ?Performed at [facility], [place] [address], [place], [place] [address] ?  ? REPTSTATUS 09/27/2021 FINAL 09/22/2021 1554  ? ? ?Cardiac Enzymes: ?No results for  input(s): CKTOTAL, CKMB, CKMBINDEX, TROPONINI in the last 168 hours. ?CBG: ?Recent Labs  ?Lab 10/19/21 ?0005 10/19/21 ?8889 10/19/21 ?0752 10/19/21 ?1009 10/19/21 ?1114  ?GLUCAP 196* 220* 185* 180* 192*  ? ?Iron Studies: No results for input(s): IRON, TIBC, TRANSFERRIN, FERRITIN in the last 72 hours. ?@lablastinr3 @ ?Studies/Results: ?No results found. ?Medications: ? dextrose 5% lactated ringers 30 mL/hr at 10/19/21 0102  ? lactated ringers Stopped (10/12/21 2239)  ? ? aspirin EC  81 mg Oral Daily  ? atorvastatin  40 mg Oral Daily  ? Chlorhexidine Gluconate Cloth  6 each Topical Q0600  ? cinacalcet  90 mg Oral Q supper  ? clopidogrel  75 mg Oral Daily  ? darbepoetin (ARANESP) injection - DIALYSIS  200 mcg Intravenous Q Thu-HD  ? dorzolamide-timolol  1 drop Left Eye BID  ? feeding supplement (NEPRO CARB STEADY)  960 mL Per Tube Q24H  ? feeding supplement (PROSource TF)  45 mL Per Tube BID  ? heparin injection (subcutaneous)  5,000 Units Subcutaneous Q8H  ? insulin aspart  0-6 Units Subcutaneous Q4H  ? insulin glargine-yfgn  5 Units Subcutaneous QHS  ? lidocaine-prilocaine  1  application. Topical UD  ? mouth rinse  15 mL Mouth Rinse BID  ? multivitamin  1 tablet Oral QHS  ? nutrition supplement (JUVEN)  1 packet Per Tube BID BM  ? nystatin  5 mL Oral QID  ? pantoprazole sodium  40 mg Oral Daily  ? senna-docusate  1 tablet Oral BID  ? sevelamer carbonate  1.6 g Oral TID WC  ? thiamine  100 mg Oral Daily  ? ? ? ?Dialysis Orders: ?[facility] ?3h 34min   400/ 600   59.5kg   2/2 bath  Prof 2   RU AV fistula  Hep 4000  ?Mircera 200 mcg IV every 2 weeks - last 1/26 ?Ancef 2g 3 times weekly HD ?Covid neg 2/3, hep B SAg neg 2/4 ?  ?Assessment/Plan: ?Embolic stroke with AMS -neuro following, acute embolic CVA / Repeat MRI with no acute findings.  With PFO on TTE. TEE showed small PFO and no clot. Blood Cx 2/4 negative. S/p course of IV abx.  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 Plan for PLEX 3/9 (5th treatment).B/L ptosis - b/l vision loss & loss of eye motility. Plan for LP after 5 days off of ASA and Plavix. Getting PLEX QOD. 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 Please see #12 for alternative access. 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 Trula Slade [facility] following.  To go to CIR at some point.  ?ESRD - Usually TTS schedule - Currently off schedule. Having HD on MWF, PLEX on T,TH,S. ?HTN/volume - noted episodes of hypotension during HD,  improved with fluid /midodrine with HD. Below prior EDW, will need to be lowered on d/c. Avoid hypotension. Keep SPB >120.  ?Sepsis/ shock - resolved  ?Anemia of CKD- last Hgb stable 8.3. Continue Aranesp 222mcg q Thurs. Transfuse pRBC prn.  ?MBD: CorrCa high, Phos ok. Continue home sensipar/binders, hold VDRA for now.   ?Severe protein calorie malnutrition: NG tube in place with Nepro TF's + supplements. ?DMT2 - per PMD  ?Access: LUE AVF with some eschar- sticking away from now, monitor closely ?Facial edema- AM of 10/15/21--> ? Delayed reaction to PLEX with FFP vs SVC- Mar reviewed, no new meds. Received Solumedrol/ benadryl/ famotidine, R EJ catheter removed.  D/w hospitalist.  Since pt has had her AKA and no bleeding > 48 hrs out, OK to convert PLEX to albumin replacement fluid. S/p L IJ trialysis HD cath in IR 3/6 by Dr. Vernard Gambles ?Dispo - likely will need SNF placement/versus CIR ? ?Kayde Atkerson H. Nikeia Henkes NP-C ?10/19/2021, 2:10 PM  ?[facility] ?[phone] ? ? ?  ? ?

## 2021-10-19 NOTE — Assessment & Plan Note (Signed)
Encephalopathy has improved.  Currently alert and oriented ?

## 2021-10-19 NOTE — Progress Notes (Signed)
[facility] ? ?Subjective  - Pain is well controlled ? ? ?Objective ?(!) 141/67 ?100 ?99 ?F (37.2 ?C) (Oral) ?14 ?100% ? ?Intake/Output Summary (Last 24 hours) at 10/19/2021 0823 ?Last data filed at 10/19/2021 0102 ?Gross per 24 hour  ?Intake 283.58 ml  ?Output -1000 ml  ?Net 1283.58 ml  ? ?Right AKA healing well appears viable, retention sock in place ?No drainage or erythema right AKA ?Right UE AV fistula with palpable thrill ? ? ?Assessment/Planning: ?41 y.o. female is s/p right above knee amputation revision from BKA ? ?Right AKA stump healing well and appears viable ?Vedia Coffer will be maintained for 4 weeks total.  Plan for [facility] f/u in 3 weeks for staple removal ?HD today  ? ? ? ?Roxy Horseman ?10/19/2021 ?8:23 AM ?-- ? ?Laboratory ?Lab Results: ?Recent Labs  ?  10/16/21 ?1407 10/18/21 ?0650  ?WBC 12.5* 12.4*  ?HGB 8.3* 9.3*  ?HCT 28.3* 32.1*  ?PLT 354 320  ? ?BMET ?Recent Labs  ?  10/18/21 ?0650 10/19/21 ?0144  ?NA 139 137  ?K 3.7 3.5  ?CL 96* 98  ?CO2 32 31  ?GLUCOSE 150* 207*  ?BUN 50* 33*  ?CREATININE 4.70* 3.76*  ?CALCIUM 10.4* 9.4  ? ? ?COAG ?Lab Results  ?Component Value Date  ? INR 1.2 08/10/2021  ? INR 1.1 06/30/2020  ? ?No results found for: PTT ? ? ? ?

## 2021-10-19 NOTE — Progress Notes (Signed)
Speech Language Pathology Treatment: Dysphagia  ?Patient Details ?Name: [name] ?MRN: [id number] ?DOB: September 26, 1980 ?Today's Date: 10/19/2021 ?Time: 1212-1221 ?SLP Time Calculation (min) (ACUTE ONLY): 9 min ? ?Assessment / Plan / Recommendation ?Clinical Impression ? [name] has been reporting odonophagia for approximately 5 days. Seen today for status and pt affirms continued pain in throat but agreeable to applesauce. During swallow she grimaces and touches throat and reports continued pain as significant as when first started. Upon inspection of oral cavity, it appears that she has lingual candidias and suspect it may be in her pharynx as well causing her pain in addition to pharyngeal edema that has been present for awhile now. Pt wishes to remain NPO and use tube feedings ?Will notify MD re: candidias and possible treatment.  ?  ?HPI HPI: 41 y.o. female presented to ED 2/3 with AMS, gangrene RLE and osteomyelitis.  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 Change in mentation 2/10; MRI brain showed scattered punctate acute infarcts throughout both cerebral hemispheres. Made NPO 2/11. Per chart also suspicion for [name] GBS given facial diplegia, ptosis, unreactive pupil.  Neurology recommends Plex (plasmpharesis exchange), status post HD catheter placed.  Received Plex 3/1, 3/3, 3/4. 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 Palliative care has been consulted in the setting of multiple acute on chronic disease processes to further discuss goals of care. Swallow eval ordered 2/13. ?  ?   ?SLP Plan ? Continue with current plan of care ? ?  ?  ?Recommendations for follow up therapy are one component of a multi-disciplinary discharge planning process, led by the attending physician.  Recommendations may be updated based on patient status, additional functional criteria and  insurance authorization. ?  ? ?Recommendations  ?Diet recommendations:  (pt choosing to continue NPO) ?Medication Administration: Via alternative means  ?   ?    ?   ? ? ? ? Oral Care Recommendations: Oral care QID ?Follow Up Recommendations: Acute inpatient rehab (3hours/day) ?Assistance recommended at discharge: Frequent or constant Supervision/Assistance ?SLP Visit Diagnosis: Dysphagia, unspecified (R13.10);Dysphagia, oral phase (R13.11) ?Plan: Continue with current plan of care ? ? ? ? ?  ?  ? ? ?[name] ? ?10/19/2021, 12:43 PM ?

## 2021-10-19 NOTE — Progress Notes (Addendum)
PROGRESS NOTE  [name]  ZMO:[id number] DOB: [birth year] DOA: 09/15/2021 PCP: [facility]   Brief Narrative:  [name] is a 41 y.o. female with medical history significant for poorly controlled DMT2, ESRD on TTS HD, HTN with poor compliance with medical treatment and HD was sent after completing dialysis session due to staff concern for altered mental status.  She has a recent history of TMA of the right foot about 2 months ago, grew MSSA and was on Ancef with dialysis.  Vascular surgery as well as nephrology consulted.  Hospital course complicated by an episode of lethargy, minimal responsiveness on 2/10 requiring rapid response.  An MRI of the brain showed scattered punctate acute infarcts bilaterally.  Neurology consulted.  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  Neurology recommends Plex, status post HD catheter placed.  Received Plex 3/1, 3/3, 3/4 ,3/7.  Last session scheduled for today.   Assessment & Plan:  Principal Problem:   Cerebral embolism with cerebral infarction Active Problems:   GBS (Guillain-Barre syndrome) (HCC)   Anemia of chronic disease   Hyperglycemia due to type 2 diabetes mellitus (HCC)   ESRD on hemodialysis (HCC)   Acute encephalopathy   Osteomyelitis (HCC)   Pressure injury of skin   PFO (patent foramen ovale)   Malnutrition of moderate degree   Ptosis of both eyelids   Hypotension   Assessment and Plan: * Cerebral embolism with cerebral infarction Embolic stroke, with positive bubble study -neurology consulted and following.  An MRI of the brain 2/10 showed scattered punctate acute infarcts throughout both cerebral hemispheres which appeared to be embolic. A transesophageal echo done on 2/20 showed small PFO with no thrombus.  Completed stroke work-up.  Currently she is on statin, aspirin and Plavix. Neurology recommended 30-day cardiac event monitoring as an  outpatient  GBS (Guillain-Barre syndrome) [facility]) Hospital course remarkable for bilateral ptosis, facial diplegia, ophthalmoplegia, hyperreflexia.  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  MRI of the brain did not show any intracranial mass or abnormal enhancement to explain patient's ptosis, ophthalmoplegia.  MRI of the orbits symmetric extraocular muscles, no abnormal enhancement of the optic nerve sheath.  Neurology recommended Plex. Per neurology, had catheter placed 3/1, started Plex.  Plan for 5 sessions, already done on 3/1, 3/3, 3/4, 3/7 and 1 on Thursday, neurology following. After Plex is done discontinue the central line. Neurology recommends EMG/nerve function test as an outpatient, outpatient neurology follow-up.  Hypotension Likely chronic follow-up.  Was on midodrine earlier, has been discontinued now.  Continue to monitor blood pressure.  We will resume midodrine   Malnutrition of moderate degree -has a core track in place.  Encourage p.o. intake. Dietitian following.  If after Plex is finished there is no improvement could potentially need PEG tube before going to CIR. speech therapy following her,continues to recommend NPO  Pressure injury of skin Pressure Injury 09/24/21 Sacrum Right;Left;Medial Stage 2 -  Partial thickness loss of dermis presenting as a shallow open injury with a red, pink wound bed without slough. (Active)  09/24/21 1600  Location: Sacrum  Location Orientation: Right;Left;Medial  Staging: Stage 2 -  Partial thickness loss of dermis presenting as a shallow open injury with a red, pink wound bed without slough.  Wound Description (Comments):   Present on Admission:        Osteomyelitis ([place]) Osteomyelitis of the right foot.with history of BKA, concern for eschar formation in the right  stump, status post AKA on the right side 10/11/2021 by vascular surgery.  Wound looks good  Acute encephalopathy Encephalopathy has improved.  Currently alert and  oriented  ESRD on hemodialysis [facility]) Nephrology following for dialysis.  Hyperglycemia due to type 2 diabetes mellitus ([place]) continue glargine and sliding scale.  .  Monitor blood sugars. continue current regimen  Anemia of chronic disease Secondary to chronic kidney disease.  Currently hemoglobin stable.  Monitor         Nutrition Problem: Moderate Malnutrition Etiology: acute illness (osteomyelitis of R foot)    DVT prophylaxis:heparin injection 5,000 Units Start: 10/09/21 1400 SCD's Start: 09/17/21 1002     Code Status: Full Code  Family Communication: Called husband twice,calls not received  Patient status:Inpatient  Patient is from :Home  Anticipated discharge to:CIR  Estimated DC date:not sure   Consultants: neurology, nephrology  Procedures: Hemodialysis  Antimicrobials:  Anti-infectives (From admission, onward)    Start     Dose/Rate Route Frequency Ordered Stop   09/26/21 1600  cefTRIAXone (ROCEPHIN) 2 g in sodium chloride 0.9 % 100 mL IVPB  Status:  Discontinued        2 g 200 mL/hr over 30 Minutes Intravenous Every 12 hours 09/26/21 1428 09/27/21 1356   09/24/21 1700  cefTRIAXone (ROCEPHIN) 2 g in sodium chloride 0.9 % 100 mL IVPB  Status:  Discontinued        2 g 200 mL/hr over 30 Minutes Intravenous Every 12 hours 09/24/21 0816 09/26/21 1428   09/23/21 1800  meropenem (MERREM) 1 g in sodium chloride 0.9 % 100 mL IVPB  Status:  Discontinued        1 g 200 mL/hr over 30 Minutes Intravenous Every 24 hours 09/22/21 1016 09/24/21 0816   09/23/21 1200  vancomycin (VANCOREADY) IVPB 500 mg/100 mL  Status:  Discontinued        500 mg 100 mL/hr over 60 Minutes Intravenous Every T-Th-Sa (Hemodialysis) 09/22/21 1016 09/25/21 0837   09/22/21 1115  meropenem (MERREM) 1 g in sodium chloride 0.9 % 100 mL IVPB  Status:  Discontinued        1 g 200 mL/hr over 30 Minutes Intravenous Every 24 hours 09/22/21 1015 09/22/21 1016   09/22/21 1115  meropenem (MERREM) 1  g in sodium chloride 0.9 % 100 mL IVPB        1 g 200 mL/hr over 30 Minutes Intravenous  Once 09/22/21 1016 09/22/21 1230   09/22/21 1115  vancomycin (VANCOREADY) IVPB 1250 mg/250 mL  Status:  Discontinued        1,250 mg 166.7 mL/hr over 90 Minutes Intravenous  Once 09/22/21 1016 09/22/21 1029   09/22/21 1115  vancomycin (VANCOCIN) IVPB 1000 mg/200 mL premix        1,000 mg 200 mL/hr over 60 Minutes Intravenous  Once 09/22/21 1029 09/22/21 1421   09/19/21 1200  vancomycin (VANCOREADY) IVPB 500 mg/100 mL  Status:  Discontinued        500 mg 100 mL/hr over 60 Minutes Intravenous Every T-Th-Sa (Hemodialysis) 09/17/21 1046 09/19/21 0759   09/17/21 0743  metroNIDAZOLE (FLAGYL) 500 MG/100ML IVPB       Note to Pharmacy: Nyoka Cowden D: cabinet override      09/17/21 0743 09/17/21 0827   09/16/21 1200  vancomycin (VANCOREADY) IVPB 750 mg/150 mL  Status:  Discontinued        750 mg 150 mL/hr over 60 Minutes Intravenous Every T-Th-Sa (Hemodialysis) 09/15/21 1748 09/17/21 1046   09/15/21 2100  ceFEPIme (MAXIPIME) 1 g in sodium chloride 0.9 % 100 mL IVPB  Status:  Discontinued        1 g 200 mL/hr over 30 Minutes Intravenous Every 24 hours 09/15/21 1957 09/19/21 0759   09/15/21 1800  ceFEPIme (MAXIPIME) 1 g in sodium chloride 0.9 % 100 mL IVPB  Status:  Discontinued        1 g 200 mL/hr over 30 Minutes Intravenous Every 24 hours 09/15/21 1742 09/15/21 1957   09/15/21 1745  metroNIDAZOLE (FLAGYL) tablet 500 mg  Status:  Discontinued        500 mg Oral Every 12 hours 09/15/21 1725 09/19/21 0759   09/15/21 1615  clindamycin (CLEOCIN) IVPB 600 mg        600 mg 100 mL/hr over 30 Minutes Intravenous  Once 09/15/21 1600 09/15/21 1638   09/15/21 1600  vancomycin (VANCOCIN) IVPB 1000 mg/200 mL premix        1,000 mg 200 mL/hr over 60 Minutes Intravenous  Once 09/15/21 1545 09/15/21 1755   09/15/21 1600  clindamycin (CLEOCIN) IVPB 600 mg  Status:  Discontinued        600 mg 100 mL/hr over 30 Minutes  Intravenous  Once 09/15/21 1545 09/15/21 1600       Subjective: Patient seen and examined at the bedside this morning.  Suctioning herself.  Eyes closed.  Communicates well and knew that current month is March.  Denies any new complaints.  Objective: Vitals:   10/19/21 0432 10/19/21 0814 10/19/21 0817 10/19/21 1000  BP: 130/68 (!) 117/39 (!) 141/67 118/72  Pulse: 91 97 100   Resp:  19 14   Temp:  99 F (37.2 C) 99 F (37.2 C)   TempSrc:  Oral Oral   SpO2: 100% 100% 100%   Weight:      Height:        Intake/Output Summary (Last 24 hours) at 10/19/2021 1302 Last data filed at 10/19/2021 0102 Gross per 24 hour  Intake 283.58 ml  Output --  Net 283.58 ml   Filed Weights   10/18/21 0613 10/18/21 0740 10/18/21 1038  Weight: 58 kg 58 kg 58.5 kg    Examination:   General exam: Very deconditioned, chronically looking HEENT: Close eyelids, feeding tube Respiratory system:  no wheezes or crackles  Cardiovascular system: S1 & S2 heard, RRR.  Gastrointestinal system: Abdomen is nondistended, soft and nontender. Central nervous system: Alert and oriented Extremities: No edema, no clubbing ,no cyanosis, AV fistula on the right forearm, central line on the left neck Skin: Pressure ulcer as above   Data Reviewed: I have personally reviewed following labs and imaging studies  CBC: Recent Labs  Lab 10/14/21 0218 10/16/21 1407 10/18/21 0650  WBC 16.2* 12.5* 12.4*  HGB 8.8* 8.3* 9.3*  HCT 29.8* 28.3* 32.1*  MCV 98.0 97.9 97.0  PLT 286 354 834   Basic Metabolic Panel: Recent Labs  Lab 10/14/21 0218 10/16/21 1406 10/18/21 0650 10/19/21 0144  NA 139 134* 139 137  K 6.2* 4.0 3.7 3.5  CL 101 97* 96* 98  CO2 23 26 32 31  GLUCOSE 51* 145* 150* 207*  BUN 43* 42* 50* 33*  CREATININE 5.74* 5.77* 4.70* 3.76*  CALCIUM 10.5* 9.3 10.4* 9.4  MG  --   --  1.9  --   PHOS  --  6.4* 4.4  --      No results found for this or any previous visit (from the past 240 hour(s)).    Radiology  Studies: No results found.  Scheduled Meds:  aspirin EC  81 mg Oral Daily   atorvastatin  40 mg Oral Daily   Chlorhexidine Gluconate Cloth  6 each Topical Q0600   cinacalcet  90 mg Oral Q supper   clopidogrel  75 mg Oral Daily   darbepoetin (ARANESP) injection - DIALYSIS  200 mcg Intravenous Q Thu-HD   dorzolamide-timolol  1 drop Left Eye BID   feeding supplement (NEPRO CARB STEADY)  960 mL Per Tube Q24H   feeding supplement (PROSource TF)  45 mL Per Tube BID   heparin injection (subcutaneous)  5,000 Units Subcutaneous Q8H   insulin aspart  0-6 Units Subcutaneous Q4H   insulin glargine-yfgn  5 Units Subcutaneous QHS   lidocaine-prilocaine  1 application. Topical UD   mouth rinse  15 mL Mouth Rinse BID   multivitamin  1 tablet Oral QHS   nutrition supplement (JUVEN)  1 packet Per Tube BID BM   pantoprazole sodium  40 mg Oral Daily   senna-docusate  1 tablet Oral BID   sevelamer carbonate  1.6 g Oral TID WC   thiamine  100 mg Oral Daily   Continuous Infusions:  dextrose 5% lactated ringers 30 mL/hr at 10/19/21 0102   lactated ringers Stopped (10/12/21 2239)     LOS: 34 days   Shelly Coss, MD [facility] P3/04/2022, 1:02 PM

## 2021-10-20 ENCOUNTER — Inpatient Hospital Stay (HOSPITAL_COMMUNITY): Payer: Medicare Other

## 2021-10-20 DIAGNOSIS — T82898A Other specified complication of vascular prosthetic devices, implants and grafts, initial encounter: Secondary | ICD-10-CM

## 2021-10-20 DIAGNOSIS — I6340 Cerebral infarction due to embolism of unspecified cerebral artery: Secondary | ICD-10-CM | POA: Diagnosis not present

## 2021-10-20 DIAGNOSIS — N185 Chronic kidney disease, stage 5: Secondary | ICD-10-CM

## 2021-10-20 LAB — THERAPEUTIC PLASMA EXCHANGE (BLOOD BANK)
Plasma Exchange: 2400
Plasma volume needed: 2400
Unit division: 0
Unit division: 0
Unit division: 0
Unit division: 0
Unit division: 0
Unit division: 0
Unit division: 0

## 2021-10-20 LAB — CBC WITH DIFFERENTIAL/PLATELET
Abs Immature Granulocytes: 0.05 10*3/uL (ref 0.00–0.07)
Basophils Absolute: 0.1 10*3/uL (ref 0.0–0.1)
Basophils Relative: 1 %
Eosinophils Absolute: 1 10*3/uL — ABNORMAL HIGH (ref 0.0–0.5)
Eosinophils Relative: 8 %
HCT: 30.6 % — ABNORMAL LOW (ref 36.0–46.0)
Hemoglobin: 8.8 g/dL — ABNORMAL LOW (ref 12.0–15.0)
Immature Granulocytes: 0 %
Lymphocytes Relative: 19 %
Lymphs Abs: 2.3 10*3/uL (ref 0.7–4.0)
MCH: 28 pg (ref 26.0–34.0)
MCHC: 28.8 g/dL — ABNORMAL LOW (ref 30.0–36.0)
MCV: 97.5 fL (ref 80.0–100.0)
Monocytes Absolute: 1 10*3/uL (ref 0.1–1.0)
Monocytes Relative: 8 %
Neutro Abs: 7.5 10*3/uL (ref 1.7–7.7)
Neutrophils Relative %: 64 %
Platelets: 268 10*3/uL (ref 150–400)
RBC: 3.14 MIL/uL — ABNORMAL LOW (ref 3.87–5.11)
RDW: 19 % — ABNORMAL HIGH (ref 11.5–15.5)
WBC: 11.9 10*3/uL — ABNORMAL HIGH (ref 4.0–10.5)
nRBC: 0 % (ref 0.0–0.2)

## 2021-10-20 LAB — BASIC METABOLIC PANEL
Anion gap: 13 (ref 5–15)
BUN: 60 mg/dL — ABNORMAL HIGH (ref 6–20)
CO2: 30 mmol/L (ref 22–32)
Calcium: 10.2 mg/dL (ref 8.9–10.3)
Chloride: 95 mmol/L — ABNORMAL LOW (ref 98–111)
Creatinine, Ser: 4.62 mg/dL — ABNORMAL HIGH (ref 0.44–1.00)
GFR, Estimated: 12 mL/min — ABNORMAL LOW (ref >60)
Glucose, Bld: 269 mg/dL — ABNORMAL HIGH (ref 70–99)
Potassium: 3.8 mmol/L (ref 3.5–5.1)
Sodium: 138 mmol/L (ref 135–145)

## 2021-10-20 LAB — GLUCOSE, CAPILLARY
Glucose-Capillary: 102 mg/dL — ABNORMAL HIGH (ref 70–99)
Glucose-Capillary: 104 mg/dL — ABNORMAL HIGH (ref 70–99)
Glucose-Capillary: 141 mg/dL — ABNORMAL HIGH (ref 70–99)
Glucose-Capillary: 170 mg/dL — ABNORMAL HIGH (ref 70–99)
Glucose-Capillary: 256 mg/dL — ABNORMAL HIGH (ref 70–99)
Glucose-Capillary: 256 mg/dL — ABNORMAL HIGH (ref 70–99)

## 2021-10-20 MED ORDER — ASPIRIN 81 MG PO CHEW: 81.0000 mg | CHEWABLE_TABLET | Freq: Every day | ORAL | Status: DC

## 2021-10-20 MED ORDER — LORAZEPAM 2 MG/ML IJ SOLN
0.5000 mg | Freq: Once | INTRAMUSCULAR | Status: AC | PRN
  Administered 2021-10-20: 0.5 mg via INTRAVENOUS

## 2021-10-20 MED ORDER — THIAMINE HCL 100 MG PO TABS
100.0000 mg | ORAL_TABLET | Freq: Every day | ORAL | Status: DC
  Administered 2021-10-22 – 2021-10-25 (×3): 100 mg
  Filled 2021-10-20 (×3): qty 1

## 2021-10-20 MED ORDER — LORAZEPAM 2 MG/ML IJ SOLN
INTRAMUSCULAR | Status: AC
  Administered 2021-10-20: 0.5 mg via INTRAVENOUS
  Filled 2021-10-20: qty 1

## 2021-10-20 MED ORDER — ACETAMINOPHEN 325 MG PO TABS
650.0000 mg | ORAL_TABLET | ORAL | Status: DC | PRN
  Administered 2021-10-24: 650 mg
  Filled 2021-10-20: qty 2

## 2021-10-20 MED ORDER — PANTOPRAZOLE 2 MG/ML SUSPENSION
40.0000 mg | Freq: Every day | ORAL | Status: DC
  Filled 2021-10-20 (×2): qty 20

## 2021-10-20 MED ORDER — SENNOSIDES-DOCUSATE SODIUM 8.6-50 MG PO TABS: 1.0000 | ORAL_TABLET | Freq: Two times a day (BID) | ORAL | Status: DC

## 2021-10-20 MED ORDER — RENA-VITE PO TABS
1.0000 | ORAL_TABLET | Freq: Every day | ORAL | Status: DC
  Administered 2021-10-22 – 2021-10-24 (×3): 1
  Filled 2021-10-20 (×3): qty 1

## 2021-10-20 MED ORDER — LORAZEPAM 2 MG/ML IJ SOLN: 0.5000 mg | Freq: Once | INTRAMUSCULAR | Status: AC

## 2021-10-20 MED ORDER — INSULIN GLARGINE-YFGN 100 UNIT/ML ~~LOC~~ SOLN
5.0000 [IU] | Freq: Two times a day (BID) | SUBCUTANEOUS | Status: DC
  Administered 2021-10-20 – 2021-10-25 (×8): 5 [IU] via SUBCUTANEOUS
  Filled 2021-10-20 (×12): qty 0.05

## 2021-10-20 MED ORDER — ATORVASTATIN CALCIUM 40 MG PO TABS
40.0000 mg | ORAL_TABLET | Freq: Every day | ORAL | Status: DC
  Administered 2021-10-22 – 2021-10-25 (×3): 40 mg
  Filled 2021-10-20 (×3): qty 1

## 2021-10-20 MED ORDER — SEVELAMER CARBONATE 0.8 G PO PACK
1.6000 g | PACK | Freq: Three times a day (TID) | ORAL | Status: DC
  Administered 2021-10-22 – 2021-10-25 (×8): 1.6 g
  Filled 2021-10-20 (×16): qty 2

## 2021-10-20 MED ORDER — CLOPIDOGREL BISULFATE 75 MG PO TABS: 75.0000 mg | ORAL_TABLET | Freq: Every day | ORAL | Status: DC

## 2021-10-20 MED ORDER — OXYCODONE-ACETAMINOPHEN 5-325 MG PO TABS
1.0000 | ORAL_TABLET | ORAL | Status: DC | PRN
  Administered 2021-10-21 – 2021-10-25 (×9): 1
  Filled 2021-10-20 (×10): qty 1

## 2021-10-20 NOTE — Progress Notes (Signed)
Physical Therapy Treatment ?Patient Details ?Name: [name] ?MRN: [id number] ?DOB: 1981/01/29 ?Today's Date: 10/20/2021 ? ? ?History of Present Illness Pt is a 41 y.o. female admitted 09/15/21 post-HD session with concern for AMS when pt stopped speaking to staff. 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 Pt with increased somnolence 2/6; noted to be more lethargic in HD 2/7, 2/9, 2/11. Head CT 2/10 negative for acute injury; chronic small vessel disease and infarcts. MRI 2/10 with scattered punctate acute infarcts bilateral cerebral hemispheres (may be embolic), potential subacute infarct L corona radiata. Pt with persistent bilateral ptosis (pt reports ongoing ~1 month). MRI orbits 2/25 with evolution of infarcts seen on prior study; no acute infarct; chronic R retinal detachment. Per neurology 2/28, high suspicion for miller fischer GBS; to proceed with PLEX treatment. S/p R IJ temporary HD cath placement 3/1. 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 Pt with worsening face/throat swelling; s/p R IJ removal 3/5. S/p L IJ HD cath 3/6. PMH includes ESRD (HD TTS), DM2, HTN, neuropathy, R eye blindness (2008). ? ?  ?PT Comments  ? ? Pt admitted with above diagnosis. Pt was able to tolerate Maxi move transfer to chair and sat up in chair about an hour.  Pt tolerated sitting in chair with geomat in place very well.   Pt progressing each day and is more interactive each day.  PT got pt up to chair and OT got pt back right after PT left.  Pt currently with functional limitations due to balance and endurance deficits. Pt will benefit from skilled PT to increase their independence and safety with mobility to allow discharge to the venue listed below.      ?Recommendations for follow up therapy are one component of a multi-disciplinary discharge planning process, led by the attending physician.  Recommendations may be updated based on patient status, additional functional criteria and insurance  authorization. ? ?Follow Up Recommendations ? Acute inpatient rehab (3hours/day) ?  ?  ?Assistance Recommended at Discharge Frequent or constant Supervision/Assistance  ?Patient can return home with the following Assistance with cooking/housework;Assistance with feeding;Direct supervision/assist for medications management;Direct supervision/assist for financial management;Assist for transportation;Help with stairs or ramp for entrance;A lot of help with bathing/dressing/bathroom;A lot of help with walking and/or transfers ?  ?Equipment Recommendations ? BSC/3in1;Wheelchair (measurements PT);Wheelchair cushion (measurements PT);Hospital bed;Other (comment) (lift equipment)  ?  ?Recommendations for Other Services Rehab consult ? ? ?  ?Precautions / Restrictions Precautions ?Precautions: Fall;Other (comment) ?Precaution Comments: R BKA now R AKA; bilateral ptosis with blindness; bowel incontinence; PLEX treatment every other day (cannot mobilize while running per neuro MD) ?Required Braces or Orthoses: Other Brace ?Other Brace: RLE limb guard ?Restrictions ?Weight Bearing Restrictions: Yes ?RUE Weight Bearing: Weight bearing as tolerated ?RLE Weight Bearing: Non weight bearing  ?  ? ?Mobility ? Bed Mobility ?Overal bed mobility: Needs Assistance ?Bed Mobility: Rolling, Sidelying to Sit, Sit to Supine ?Rolling: Mod assist, Min assist ?Sidelying to sit: Mod assist, +2 for safety/equipment ?  ?  ?  ?General bed mobility comments: initially rolling L/R for Maximove placement ?  ? ?Transfers ?Overall transfer level: Needs assistance ?Equipment used: Ambulation equipment used ?  ?  ?  ?  ?  ?  ?  ?General transfer comment: Used Maximove to get pt to chair.  Once in chair worked on pt leaning forward using her UEs to pull forward. Also washed her back and pt washed her face. Pt also did LE exercise. ?Transfer via Lift  Equipment: Maximove ? ?Ambulation/Gait ?  ?  ?  ?  ?  ?  ?  ?  ? ? ?Stairs ?  ?  ?  ?  ?  ? ? ?Wheelchair  Mobility ?  ? ?Modified Rankin (Stroke Patients Only) ?Modified Rankin (Stroke Patients Only) ?Pre-Morbid Rankin Score: No symptoms ?Modified Rankin: Severe disability ? ? ?  ?Balance Overall balance assessment: Needs assistance ?Sitting-balance support: Feet supported, Bilateral upper extremity supported, Single extremity supported ?Sitting balance-Leahy Scale: Poor ?Sitting balance - Comments: Demonstrates significant improvement in tolerance to sitting; initially requiring modA to maintain midline, but progressed to min guard in chair with pt able to use UEs to shift weight while in chair with pillow under right UE,  reliant on UE support ?  ?  ?  ?  ?  ?  ?  ?  ?  ?  ?  ?  ?  ?  ?  ?  ? ?  ?Cognition Arousal/Alertness: Awake/alert ?Behavior During Therapy: Flat affect ?Overall Cognitive Status: Difficult to assess ?Area of Impairment: Attention, Following commands, Safety/judgement, Awareness, Problem solving ?  ?  ?  ?  ?  ?  ?  ?  ?Orientation Level: Place, Time, Situation ?Current Attention Level: Sustained ?Memory: Decreased recall of precautions, Decreased short-term memory ?Following Commands: Follows one step commands with increased time ?Safety/Judgement: Decreased awareness of safety, Decreased awareness of deficits ?Awareness: Emergent ?Problem Solving: Slow processing, Difficulty sequencing, Requires verbal cues, Requires tactile cues ?General Comments: pt motivated to participate, demonstrates improving awareness and ability to communicate needs; improved effort with activity ?  ?  ? ?  ?Exercises General Exercises - Upper Extremity ?Shoulder Flexion: AROM, Both, 5 reps, Supine ?General Exercises - Lower Extremity ?Long Arc Quad: AROM, Left, Seated, 10 reps ?Hip Flexion/Marching: AROM, Both, Seated, 10 reps ? ?  ?General Comments   ?  ?  ? ?Pertinent Vitals/Pain Pain Assessment ?Pain Assessment: Faces ?Faces Pain Scale: Hurts even more ?Pain Location: R hand, throat, buttocks w/ pericare ?Pain  Descriptors / Indicators: Discomfort, Grimacing ?Pain Intervention(s): Limited activity within patient's tolerance, Monitored during session, Repositioned  ? ? ?Home Living   ?  ?  ?  ?  ?  ?  ?  ?  ?  ?   ?  ?Prior Function    ?  ?  ?   ? ?PT Goals (current goals can now be found in the care plan section) Acute Rehab PT Goals ?Patient Stated Goal: to get to rehab ?Progress towards PT goals: Progressing toward goals ? ?  ?Frequency ? ? ? Min 4X/week ? ? ? ?  ?PT Plan Current plan remains appropriate  ? ? ?Co-evaluation   ?  ?  ?  ?  ? ?  ?AM-PAC PT "6 Clicks" Mobility   ?Outcome Measure ? Help needed turning from your back to your side while in a flat bed without using bedrails?: A Lot ?Help needed moving from lying on your back to sitting on the side of a flat bed without using bedrails?: A Lot ?Help needed moving to and from a bed to a chair (including a wheelchair)?: Total ?Help needed standing up from a chair using your arms (e.g., wheelchair or bedside chair)?: Total ?Help needed to walk in hospital room?: Total ?Help needed climbing 3-5 steps with a railing? : Total ?6 Click Score: 8 ? ?  ?End of Session   ?Activity Tolerance: Patient tolerated treatment well ?Patient left: in chair;with call bell/phone within  reach (OT came in after pt.) ?Nurse Communication: Mobility status;Need for lift equipment ?PT Visit Diagnosis: Other abnormalities of gait and mobility (R26.89);Muscle weakness (generalized) (M62.81);Difficulty in walking, not elsewhere classified (R26.2);Other symptoms and signs involving the nervous system (R29.898) ?Pain - Right/Left: Right ?Pain - part of body: Leg ?  ? ? ?Time: 8871-9597 ?PT Time Calculation (min) (ACUTE ONLY): 38 min ? ?Charges:  $Therapeutic Exercise: 8-22 mins ?$Therapeutic Activity: 23-37 mins          ?          ? ?Angeligue Bowne M,PT ?Acute Rehab Services ?[phone] ?[phone] (pager)  ? ? ?Caedin Mogan F Micaela Stith ?10/20/2021, 3:30 PM ? ?

## 2021-10-20 NOTE — Progress Notes (Signed)
OT Cancellation Note ? ?Patient Details ?Name: [name] ?MRN: [id number] ?DOB: 1981/08/03 ? ? ?Cancelled Treatment:    Reason Eval/Treat Not Completed: Patient at procedure or test/ unavailable (Will continue to follow.) ? ?[name] ?10/20/2021, 10:54 AM ?Nestor Lewandowsky, OTR/L ?Acute Rehabilitation Services ?Pager: [phone] ?Office: [phone]  ?

## 2021-10-20 NOTE — Progress Notes (Addendum)
[facility] [place] ? ?Subjective  - No new complaints ? ? ?Objective ?104/81 ?92 ?97.8 ?F (36.6 ?C) (Temporal) ?13 ?100% ?No intake or output data in the 24 hours ending 10/20/21 1030 ? ?Right UE AF fistula with ulcer, palpable thrill running well on HD currently ?Motor in the right hand intact warm to touch without ischemic changes ? ? ? ? ? ?Right AKA healing well ? ? ?Assessment/Planning: ?ESRD on HD via right UE AV fistula with ulcer ?No recent history of prolonged bleeding or active sign of infection. ?Plan will be for plication Monday ? ?Right AKA healing well without ischemic changes ? ? ? ?[name] ?10/20/2021 ?10:30 AM ?-- ? ?Laboratory ?Lab Results: ?Recent Labs  ?  10/18/21 ?5374 10/20/21 ?0345  ?WBC 12.4* 11.9*  ?HGB 9.3* 8.8*  ?HCT 32.1* 30.6*  ?PLT 320 268  ? ?BMET ?Recent Labs  ?  10/19/21 ?0144 10/20/21 ?0345  ?NA 137 138  ?K 3.5 3.8  ?CL 98 95*  ?CO2 31 30  ?GLUCOSE 207* 269*  ?BUN 33* 60*  ?CREATININE 3.76* 4.62*  ?CALCIUM 9.4 10.2  ? ? ?COAG ?Lab Results  ?Component Value Date  ? INR 1.2 08/10/2021  ? INR 1.1 06/30/2020  ? ?No results found for: PTT ? ?I have seen and evaluated the patient. I agree with the PA note as documented above.  41 year old female with end-stage renal disease well-known to vascular surgery that vascular surgery is consulted for ulceration over her right upper arm AV fistula.  As pictured above, she has an eschar here with scab.  No active bleeding at this time and has not had a bleeding event.  They are using the fistula today in dialysis by sticking away from the ulcer.  Appears to have a good thrill.  I think this is a brachiocephalic fistula.  I have posted her for Monday for right arm AV fistula revision with excision of the ulcer and a possible TDC with me in the OR.  I discussed all this with the patient.  She seems a bit sleepy today.  Most recently had a right above-knee amputation on 10/11/2021 by my partner Dr. Trula Slade.  This  looks good and is healing with staples as pictured above. ? ?Tracey Heck, MD ?[facility] ?Office: [phone] ? ? ?

## 2021-10-20 NOTE — Progress Notes (Addendum)
Inpatient Diabetes Program Recommendations ? ?AACE/ADA: New Consensus Statement on Inpatient Glycemic Control (2015) ? ?Target Ranges:  Prepandial:   less than 140 mg/dL ?     Peak postprandial:   less than 180 mg/dL (1-2 hours) ?     Critically ill patients:  140 - 180 mg/dL  ? ?Lab Results  ?Component Value Date  ? GLUCAP 256 (H) 10/20/2021  ? HGBA1C 10.4 (H) 08/11/2021  ? ? ?Review of Glycemic Control ? Latest Reference Range & Units 10/19/21 20:40 10/19/21 23:57 10/20/21 04:16 10/20/21 07:51  ?Glucose-Capillary 70 - 99 mg/dL 226 (H) 239 (H) 256 (H) 256 (H)  ? ?Diabetes history: DM 1/ESRD ?Outpatient Diabetes medications:  ?Lantus 20 units- patient was not taking ?Current orders for Inpatient glycemic control:  ?Novolog 0-6 units q 4 hours ?Semglee 5 units daily ?Nepro 40 ml/hr ? ?Inpatient Diabetes Program Recommendations:   ? ?Consider increasing Semglee to 5 units bid.  ? ?Thanks,  ?Adah Perl, RN, BC-ADM ?Inpatient Diabetes Coordinator ?Pager [phone]  (8a-5p) ? ? ? ?

## 2021-10-20 NOTE — Progress Notes (Signed)
PROGRESS NOTE  [name]  ZCH:[id number] DOB: 02-18-1981 DOA: 09/15/2021 PCP: [facility], [facility]   Brief Narrative:  [name] is a 41 y.o. female with medical history significant for poorly controlled DMT2, ESRD on TTS HD, HTN with poor compliance with medical treatment and HD was sent after completing dialysis session due to staff concern for altered mental status.  She has a recent history of TMA of the right foot about 2 months ago, grew MSSA and was on Ancef with dialysis.  Vascular surgery as well as nephrology consulted.  Hospital course complicated by an episode of lethargy, minimal responsiveness on 2/10 requiring rapid response.  An MRI of the brain showed scattered punctate acute infarcts bilaterally.  Neurology consulted.  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  Neurology recommends Plex, status post HD catheter placed.  Received Plex 3/1, 3/3, 3/4 ,3/7,3/9.  Hospital course remarkable for persistent dysphagia needing tube feeding by core track, plan for PEG tube placement.   Assessment & Plan:  Principal Problem:   Cerebral embolism with cerebral infarction Active Problems:   GBS (Guillain-Barre syndrome) (HCC)   Anemia of chronic disease   Hyperglycemia due to type 2 diabetes mellitus (HCC)   ESRD on hemodialysis (HCC)   Acute encephalopathy   Osteomyelitis (HCC)   Pressure injury of skin   PFO (patent foramen ovale)   Malnutrition of moderate degree   Ptosis of both eyelids   Hypotension   Assessment and Plan: * Cerebral embolism with cerebral infarction Embolic stroke, with positive bubble study -neurology consulted and following.  An MRI of the brain 2/10 showed scattered punctate acute infarcts throughout both cerebral hemispheres which appeared to be embolic. A transesophageal echo done on 2/20 showed small PFO with no thrombus.  Completed stroke work-up.  Currently she is on statin,  aspirin and Plavix. Neurology recommended 30-day cardiac event monitoring as an outpatient  GBS (Guillain-Barre syndrome) [facility]) Hospital course remarkable for bilateral ptosis, facial diplegia, ophthalmoplegia, hyperreflexia.  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  MRI of the brain did not show any intracranial mass or abnormal enhancement to explain patient's ptosis, ophthalmoplegia.  MRI of the orbits symmetric extraocular muscles, no abnormal enhancement of the optic nerve sheath.  Neurology recommended Plex. Per neurology, had catheter placed 3/1, started Plex.  Done on 3/1, 3/3, 3/4, 3/7 ,3/9 , neurology were following. After Plex is done, discontinue the central line. Neurology recommends EMG/nerve function test as an outpatient, outpatient neurology follow-up.  Hypotension Likely chronic follow-up.  Was on midodrine earlier, has been discontinued now.  Continue to monitor blood pressure.  We will resume midodrine   Malnutrition of moderate degree -has a core track in place.  Encourage p.o. intake. Dietitian following. speech therapy following her,continues to recommend NPO.She has agreed for PEG  Pressure injury of skin Pressure Injury 09/24/21 Sacrum Right;Left;Medial Stage 2 -  Partial thickness loss of dermis presenting as a shallow open injury with a red, pink wound bed without slough. (Active)  09/24/21 1600  Location: Sacrum  Location Orientation: Right;Left;Medial  Staging: Stage 2 -  Partial thickness loss of dermis presenting as a shallow open injury with a red, pink wound bed without slough.  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  Wound looks good  Acute encephalopathy Encephalopathy has improved.  Currently alert and oriented  ESRD on hemodialysis  [facility]) Nephrology following for dialysis.  Hyperglycemia due to type 2 diabetes mellitus ([place]) continue glargine and sliding scale.  .  Monitor blood sugars. continue current regimen  Anemia of chronic disease Secondary to chronic kidney disease.  Currently hemoglobin stable.  Monitor         Nutrition Problem: Moderate Malnutrition Etiology: acute illness (osteomyelitis of R foot)    DVT prophylaxis:heparin injection 5,000 Units Start: 10/09/21 1400 SCD's Start: 09/17/21 1002     Code Status: Full Code  Family Communication: Called husband twice on 3/9,calls not received  Patient status:Inpatient  Patient is from :Home  Anticipated discharge to:CIR  Estimated DC date:not sure   Consultants: neurology, nephrology  Procedures: Hemodialysis  Antimicrobials:  Anti-infectives (From admission, onward)    Start     Dose/Rate Route Frequency Ordered Stop   09/26/21 1600  cefTRIAXone (ROCEPHIN) 2 g in sodium chloride 0.9 % 100 mL IVPB  Status:  Discontinued        2 g 200 mL/hr over 30 Minutes Intravenous Every 12 hours 09/26/21 1428 09/27/21 1356   09/24/21 1700  cefTRIAXone (ROCEPHIN) 2 g in sodium chloride 0.9 % 100 mL IVPB  Status:  Discontinued        2 g 200 mL/hr over 30 Minutes Intravenous Every 12 hours 09/24/21 0816 09/26/21 1428   09/23/21 1800  meropenem (MERREM) 1 g in sodium chloride 0.9 % 100 mL IVPB  Status:  Discontinued        1 g 200 mL/hr over 30 Minutes Intravenous Every 24 hours 09/22/21 1016 09/24/21 0816   09/23/21 1200  vancomycin (VANCOREADY) IVPB 500 mg/100 mL  Status:  Discontinued        500 mg 100 mL/hr over 60 Minutes Intravenous Every T-Th-Sa (Hemodialysis) 09/22/21 1016 09/25/21 0837   09/22/21 1115  meropenem (MERREM) 1 g in sodium chloride 0.9 % 100 mL IVPB  Status:  Discontinued        1 g 200 mL/hr over 30 Minutes Intravenous Every 24 hours 09/22/21 1015 09/22/21 1016   09/22/21 1115  meropenem (MERREM) 1 g in sodium chloride 0.9  % 100 mL IVPB        1 g 200 mL/hr over 30 Minutes Intravenous  Once 09/22/21 1016 09/22/21 1230   09/22/21 1115  vancomycin (VANCOREADY) IVPB 1250 mg/250 mL  Status:  Discontinued        1,250 mg 166.7 mL/hr over 90 Minutes Intravenous  Once 09/22/21 1016 09/22/21 1029   09/22/21 1115  vancomycin (VANCOCIN) IVPB 1000 mg/200 mL premix        1,000 mg 200 mL/hr over 60 Minutes Intravenous  Once 09/22/21 1029 09/22/21 1421   09/19/21 1200  vancomycin (VANCOREADY) IVPB 500 mg/100 mL  Status:  Discontinued        500 mg 100 mL/hr over 60 Minutes Intravenous Every T-Th-Sa (Hemodialysis) 09/17/21 1046 09/19/21 0759   09/17/21 0743  metroNIDAZOLE (FLAGYL) 500 MG/100ML IVPB       Note to Pharmacy: Nyoka Cowden D: cabinet override      09/17/21 0743 09/17/21 0827   09/16/21 1200  vancomycin (VANCOREADY) IVPB 750 mg/150 mL  Status:  Discontinued        750 mg 150 mL/hr over 60 Minutes Intravenous Every T-Th-Sa (Hemodialysis) 09/15/21 1748 09/17/21 1046   09/15/21 2100  ceFEPIme (MAXIPIME) 1 g in sodium chloride  0.9 % 100 mL IVPB  Status:  Discontinued        1 g 200 mL/hr over 30 Minutes Intravenous Every 24 hours 09/15/21 1957 09/19/21 0759   09/15/21 1800  ceFEPIme (MAXIPIME) 1 g in sodium chloride 0.9 % 100 mL IVPB  Status:  Discontinued        1 g 200 mL/hr over 30 Minutes Intravenous Every 24 hours 09/15/21 1742 09/15/21 1957   09/15/21 1745  metroNIDAZOLE (FLAGYL) tablet 500 mg  Status:  Discontinued        500 mg Oral Every 12 hours 09/15/21 1725 09/19/21 0759   09/15/21 1615  clindamycin (CLEOCIN) IVPB 600 mg        600 mg 100 mL/hr over 30 Minutes Intravenous  Once 09/15/21 1600 09/15/21 1638   09/15/21 1600  vancomycin (VANCOCIN) IVPB 1000 mg/200 mL premix        1,000 mg 200 mL/hr over 60 Minutes Intravenous  Once 09/15/21 1545 09/15/21 1755   09/15/21 1600  clindamycin (CLEOCIN) IVPB 600 mg  Status:  Discontinued        600 mg 100 mL/hr over 30 Minutes Intravenous  Once  09/15/21 1545 09/15/21 1600       Subjective: Patient seen and examined at the bedside this morning.  Hemodynamically stable.  She was at dialysis.  No new complaints. Objective: Vitals:   10/20/21 1100 10/20/21 1130 10/20/21 1144 10/20/21 1327  BP: (!) 102/58 (!) 94/56 122/64 135/77  Pulse:   93 95  Resp: 16 12 18 17   Temp:   (!) 97.4 F (36.3 C) 98.1 F (36.7 C)  TempSrc:   Temporal Oral  SpO2:   98% 97%  Weight:   61.7 kg   Height:        Intake/Output Summary (Last 24 hours) at 10/20/2021 1328 Last data filed at 10/20/2021 1144 Gross per 24 hour  Intake --  Output 830 ml  Net -830 ml   Filed Weights   10/19/21 1923 10/20/21 0845 10/20/21 1144  Weight: 59 kg 62.5 kg 61.7 kg    Examination:   General exam: Very deconditioned, chronically ill looking HEENT: Close eyelids, feeding tube Respiratory system:  no wheezes or crackles  Cardiovascular system: S1 & S2 heard, RRR.  Gastrointestinal system: Abdomen is nondistended, soft and nontender. Central nervous system: Alert and oriented Extremities: No edema, no clubbing ,no cyanosis, AV fistula on the right forearm, central line on the left neck Skin: Pressure ulcer as above   Data Reviewed: I have personally reviewed following labs and imaging studies  CBC: Recent Labs  Lab 10/14/21 0218 10/16/21 1407 10/18/21 0650 10/20/21 0345  WBC 16.2* 12.5* 12.4* 11.9*  NEUTROABS  --   --   --  7.5  HGB 8.8* 8.3* 9.3* 8.8*  HCT 29.8* 28.3* 32.1* 30.6*  MCV 98.0 97.9 97.0 97.5  PLT 286 354 320 233   Basic Metabolic Panel: Recent Labs  Lab 10/14/21 0218 10/16/21 1406 10/18/21 0650 10/19/21 0144 10/20/21 0345  NA 139 134* 139 137 138  K 6.2* 4.0 3.7 3.5 3.8  CL 101 97* 96* 98 95*  CO2 23 26 32 31 30  GLUCOSE 51* 145* 150* 207* 269*  BUN 43* 42* 50* 33* 60*  CREATININE 5.74* 5.77* 4.70* 3.76* 4.62*  CALCIUM 10.5* 9.3 10.4* 9.4 10.2  MG  --   --  1.9  --   --   PHOS  --  6.4* 4.4  --   --  No  results found for this or any previous visit (from the past 240 hour(s)).   Radiology Studies: No results found.  Scheduled Meds:  aspirin EC  81 mg Oral Daily   atorvastatin  40 mg Oral Daily   Chlorhexidine Gluconate Cloth  6 each Topical Q0600   cinacalcet  90 mg Oral Q supper   clopidogrel  75 mg Oral Daily   darbepoetin (ARANESP) injection - DIALYSIS  200 mcg Intravenous Q Fri-HD   dorzolamide-timolol  1 drop Left Eye BID   feeding supplement (NEPRO CARB STEADY)  960 mL Per Tube Q24H   feeding supplement (PROSource TF)  45 mL Per Tube BID   heparin injection (subcutaneous)  5,000 Units Subcutaneous Q8H   insulin aspart  0-6 Units Subcutaneous Q4H   insulin glargine-yfgn  5 Units Subcutaneous BID   lidocaine-prilocaine  1 application. Topical UD   mouth rinse  15 mL Mouth Rinse BID   multivitamin  1 tablet Oral QHS   nutrition supplement (JUVEN)  1 packet Per Tube BID BM   nystatin  5 mL Oral QID   pantoprazole sodium  40 mg Oral Daily   senna-docusate  1 tablet Oral BID   sevelamer carbonate  1.6 g Oral TID WC   thiamine  100 mg Oral Daily   Continuous Infusions:  dextrose 5% lactated ringers 30 mL/hr at 10/19/21 0102   lactated ringers Stopped (10/12/21 2239)     LOS: 35 days   Shelly Coss, MD [facility] P3/05/2022, 1:28 PM

## 2021-10-20 NOTE — Progress Notes (Signed)
Pt completed UB exercises, 2 grooming activities and trunk strengthening seated in recliner.  Assisted back to bed after 50 minutes in chair using maximove and positioned for comfort in bed on L side. Pt with buttocks pain limiting sitting tolerance. Continues to be appropriate for AIR level therapy. ? ? 10/20/21 1600  ?OT Visit Information  ?Last OT Received On 10/20/21  ?Assistance Needed +2  ?History of Present Illness Pt is a 41 y.o. female admitted 09/15/21 post-HD session with concern for AMS when pt stopped speaking to staff. 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 Pt with increased somnolence 2/6; noted to be more lethargic in HD 2/7, 2/9, 2/11. Head CT 2/10 negative for acute injury; chronic small vessel disease and infarcts. MRI 2/10 with scattered punctate acute infarcts bilateral cerebral hemispheres (may be embolic), potential subacute infarct L corona radiata. Pt with persistent bilateral ptosis (pt reports ongoing ~1 month). MRI orbits 2/25 with evolution of infarcts seen on prior study; no acute infarct; chronic R retinal detachment. Per neurology 2/28, high suspicion for miller fischer GBS; to proceed with PLEX treatment. S/p R IJ temporary HD cath placement 3/1. 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 Pt with worsening face/throat swelling; s/p R IJ removal 3/5. S/p L IJ HD cath 3/6. PMH includes ESRD (HD TTS), DM2, HTN, neuropathy, R eye blindness (2008).  ?Precautions  ?Precautions Fall;Other (comment)  ?Precaution Comments R BKA now R AKA; bilateral ptosis with blindness; bowel incontinence; PLEX treatment every other day (cannot mobilize while running per neuro MD)  ?Required Braces or Orthoses Other Brace  ?Other Brace RLE limb guard  ?Restrictions  ?Weight Bearing Restrictions Yes  ?RUE Weight Bearing WBAT  ?RLE Weight Bearing NWB  ?Pain Assessment  ?Pain Assessment Faces  ?Faces Pain Scale 6  ?Pain Location buttocks  ?Pain Descriptors / Indicators Discomfort;Grimacing  ?Pain  Intervention(s) Repositioned;Monitored during session;Limited activity within patient's tolerance  ?Cognition  ?Arousal/Alertness Awake/alert  ?Behavior During Therapy Flat affect  ?Overall Cognitive Status Impaired/Different from baseline  ?Area of Impairment Attention;Following commands;Problem solving  ?Current Attention Level Sustained  ?Memory Decreased short-term memory  ?Following Commands Follows one step commands with increased time  ?Awareness Emergent  ?Problem Solving Slow processing;Difficulty sequencing;Requires verbal cues;Requires tactile cues  ?General Comments pt directing her care and positioning  ?ADL  ?Overall ADL's  Needs assistance/impaired  ?Grooming Oral care;Wash/dry face;Sitting;Minimal assistance  ?Grooming Details (indicate cue type and reason) primarily using L hand as lead  ?Bed Mobility  ?Overal bed mobility Needs Assistance  ?Bed Mobility Rolling;Supine to Sit;Sit to Supine  ?Rolling Min assist;Mod assist  ?Supine to sit +2 for physical assistance;Min assist  ?Sit to supine +2 for physical assistance;Min assist  ?General bed mobility comments rolled for placement of pillow to off load buttocks, pulled up into long sitting to remove maximove pad  ?Transfers  ?Overall transfer level Needs assistance  ?Equipment used Ambulation equipment used  ?Transfer via Sports coach  ?General transfer comment maximove back to bed  ?Balance  ?Overall balance assessment Needs assistance  ?Sitting balance-Leahy Scale Poor  ?Sitting balance - Comments worked on coming forward and back in chair with hands on arms of chair assisting  ?General Exercises - Upper Extremity  ?Shoulder Flexion Both;5 reps;Seated;AAROM  ?Elbow Flexion AROM;5 reps;Both;Seated  ?Elbow Extension AROM;Both;5 reps;Seated  ?Digit Composite Flexion AROM;Strengthening;Both;5 reps;Seated  ?Composite Extension AROM;Both;5 reps;Seated  ?OT - End of Session  ?Activity Tolerance Patient tolerated treatment well  ?Patient left in  bed;with call  bell/phone within reach;with bed alarm set  ?Nurse Communication  ?(pt asking for lunch, NPO pending chest xray)  ?OT Assessment/Plan  ?OT Plan Discharge plan remains appropriate  ?OT Visit Diagnosis Other abnormalities of gait and mobility (R26.89);Unsteadiness on feet (R26.81);Muscle weakness (generalized) (M62.81);Other symptoms and signs involving cognitive function;Low vision, both eyes (H54.2)  ?OT Frequency (ACUTE ONLY) Min 2X/week  ?Follow Up Recommendations Acute inpatient rehab (3hours/day)  ?Assistance recommended at discharge Frequent or constant Supervision/Assistance  ?Patient can return home with the following A lot of help with walking and/or transfers;A lot of help with bathing/dressing/bathroom;Assistance with cooking/housework;Assistance with feeding;Direct supervision/assist for medications management;Direct supervision/assist for financial management;Assist for transportation  ?OT Equipment Other (comment) ?(defer to next venue)  ?AM-PAC OT "6 Clicks" Daily Activity Outcome Measure (Version 2)  ?Help from another person eating meals? 1  ?Help from another person taking care of personal grooming? 3  ?Help from another person toileting, which includes using toliet, bedpan, or urinal? 1  ?Help from another person bathing (including washing, rinsing, drying)? 2  ?Help from another person to put on and taking off regular upper body clothing? 1  ?Help from another person to put on and taking off regular lower body clothing? 1  ?6 Click Score 9  ?Progressive Mobility  ?What is the highest level of mobility based on the progressive mobility assessment? Level 2 (Chairfast) - Balance while sitting on edge of bed and cannot stand  ?Activity Transferred from chair to bed  ?OT Goal Progression  ?Progress towards OT goals Progressing toward goals  ?Acute Rehab OT Goals  ?OT Goal Formulation With patient  ?Time For Goal Achievement 11/02/21  ?Potential to [place]  ?OT Time Calculation   ?OT Start Time (ACUTE ONLY) 1445  ?OT Stop Time (ACUTE ONLY) 1520  ?OT Time Calculation (min) 35 min  ?OT General Charges  ?$OT Visit 1 Visit  ?OT Treatments  ?$Self Care/Home Management  8-22 mins  ?$Therapeutic Activity 8-22 mins  ?Nestor Lewandowsky, OTR/L ?Acute Rehabilitation Services ?Pager: [phone] ?Office: [phone]  ?

## 2021-10-20 NOTE — Progress Notes (Addendum)
? [facility] ?Progress Note  ? ?Subjective: Yelling while being put on HD. Has thinning ulcerated area on AVF with some swelling around AVF. Cannulating away from area. Ask VVS to see pt.  ? ?Addendum: Continues to scream and yell, disrupting other patients. Unable to redirect. Giving Lorazepam 0.5 mg IV for agitation.  ? ?HD today, PLEX 10/21/2021 ? ?Objective ?Vitals:  ? 10/19/21 1923 10/19/21 2040 10/20/21 0415 10/20/21 0748  ?BP: (!) 112/56 (!) 89/51 128/70 (!) 147/78  ?Pulse: 98 96 94 92  ?Resp: 20   18  ?Temp: 98.2 ?F (36.8 ?C) 98.3 ?F (36.8 ?C)  97.9 ?F (36.6 ?C)  ?TempSrc:  Oral  Oral  ?SpO2: 100% 100% 100% 99%  ?Weight: 59 kg     ?Height:      ? ?Physical Exam ?General: Chronically ill appearing female in NAD ?Heart: S1,S2 no M/R/G. SR 90s.  ?Lungs: CTAB ?Abdomen: obese. Cortrak FT in place.  ?Extremities: R AKA staples intact. No LLE edema ?Dialysis Access: R AVF + T/B upper arm appears edematous, can see ulcerated area upper portion of AVF with thinning area. Temp cath intact [facility].  ?  ? ? ?Additional Objective ?Labs: ?Basic Metabolic Panel: ?Recent Labs  ?Lab 10/16/21 ?1406 10/18/21 ?0650 10/19/21 ?0144 10/20/21 ?0345  ?NA 134* 139 137 138  ?K 4.0 3.7 3.5 3.8  ?CL 97* 96* 98 95*  ?CO2 26 32 31 30  ?GLUCOSE 145* 150* 207* 269*  ?BUN 42* 50* 33* 60*  ?CREATININE 5.77* 4.70* 3.76* 4.62*  ?CALCIUM 9.3 10.4* 9.4 10.2  ?PHOS 6.4* 4.4  --   --   ? ?Liver Function Tests: ?Recent Labs  ?Lab 10/16/21 ?1406 10/18/21 ?0650  ?AST  --  20  ?ALT  --  15  ?ALKPHOS  --  59  ?BILITOT  --  0.4  ?PROT  --  6.1*  ?ALBUMIN 2.2* 2.6*  ? ?No results for input(s): LIPASE, AMYLASE in the last 168 hours. ?CBC: ?Recent Labs  ?Lab 10/14/21 ?0218 10/16/21 ?1407 10/18/21 ?0650 10/20/21 ?0345  ?WBC 16.2* 12.5* 12.4* 11.9*  ?NEUTROABS  --   --   --  7.5  ?HGB 8.8* 8.3* 9.3* 8.8*  ?HCT 29.8* 28.3* 32.1* 30.6*  ?MCV 98.0 97.9 97.0 97.5  ?PLT 286 354 320 268  ? ?Blood Culture ?   ?Component Value Date/Time  ? SDES BLOOD LEFT  HAND 09/22/2021 1554  ? SPECREQUEST  09/22/2021 1554  ?  BOTTLES DRAWN AEROBIC AND ANAEROBIC Blood Culture results may not be optimal due to an inadequate volume of blood received in culture bottles  ? CULT  09/22/2021 1554  ?  NO GROWTH 5 DAYS ?Performed at [facility], [place] [address] [place], [place] [address] ?  ? REPTSTATUS 09/27/2021 FINAL 09/22/2021 1554  ? ? ?Cardiac Enzymes: ?No results for input(s): CKTOTAL, CKMB, CKMBINDEX, TROPONINI in the last 168 hours. ?CBG: ?Recent Labs  ?Lab 10/19/21 ?1615 10/19/21 ?2040 10/19/21 ?2357 10/20/21 ?2376 10/20/21 ?0751  ?GLUCAP 192* 226* 239* 256* 256*  ? ?Iron Studies: No results for input(s): IRON, TIBC, TRANSFERRIN, FERRITIN in the last 72 hours. ?@lablastinr3 @ ?Studies/Results: ?No results found. ?Medications: ? anticoagulant sodium citrate    ? citrate dextrose    ? dextrose 5% lactated ringers 30 mL/hr at 10/19/21 0102  ? lactated ringers Stopped (10/12/21 2239)  ? ? aspirin EC  81 mg Oral Daily  ? atorvastatin  40 mg Oral Daily  ? Chlorhexidine Gluconate Cloth  6 each Topical Q0600  ? cinacalcet  90 mg Oral Q supper  ? clopidogrel  75 mg Oral Daily  ? darbepoetin (ARANESP) injection - DIALYSIS  200 mcg Intravenous Q Fri-HD  ? dorzolamide-timolol  1 drop Left Eye BID  ? feeding supplement (NEPRO CARB STEADY)  960 mL Per Tube Q24H  ? feeding supplement (PROSource TF)  45 mL Per Tube BID  ? heparin injection (subcutaneous)  5,000 Units Subcutaneous Q8H  ? insulin aspart  0-6 Units Subcutaneous Q4H  ? insulin glargine-yfgn  5 Units Subcutaneous QHS  ? lidocaine-prilocaine  1 application. Topical UD  ? mouth rinse  15 mL Mouth Rinse BID  ? multivitamin  1 tablet Oral QHS  ? nutrition supplement (JUVEN)  1 packet Per Tube BID BM  ? nystatin  5 mL Oral QID  ? pantoprazole sodium  40 mg Oral Daily  ? senna-docusate  1 tablet Oral BID  ? sevelamer carbonate  1.6 g Oral TID WC  ? thiamine  100 mg Oral Daily  ? ? ? ?Dialysis Orders: ?[facility] ?3h 41min   400/ 600    59.5kg   2/2 bath  UFP 2   RU AV fistula   ?Hep 4000 units IV TIW ?Mircera 200 mcg IV every 2 weeks - last 1/26 ?Ancef 2g 3 times weekly HD ?Covid neg 2/3, hep B SAg neg 2/4 ?  ?Assessment/Plan: ?Embolic stroke with AMS -neuro following, acute embolic CVA / Repeat MRI with no acute findings.  With PFO on TTE. TEE showed small PFO and no clot. Blood Cx 2/4 negative. S/p course of IV abx.  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 Plan for PLEX 3/11 (6th treatment).B/L ptosis - b/l vision loss & loss of eye motility. Plan for LP after 5 days off of ASA and Plavix. Getting PLEX QOD. 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 Please see #[id number] for alternative access. 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 Trula Slade VVS following.  To go to CIR at some point.  ?ESRD - Usually TTS schedule - Currently off schedule. Having HD on MWF, PLEX on T,TH,S. ?HTN/volume - noted episodes of hypotension during HD,  improved with fluid /midodrine with HD. Below prior EDW, will need to be lowered on d/c. Avoid hypotension. Keep SPB >120.  ?Sepsis/ shock - resolved  ?Anemia of CKD- last Hgb stable 8.3. Continue Aranesp 233mcg q Thurs. Transfuse pRBC prn.  ?MBD: CorrCa high, Phos ok. Continue home sensipar/binders, hold VDRA for now.   ?Severe protein calorie malnutrition: NG tube in place with Nepro TF's + supplements. ?DMT2 - per PMD  ?Access: LUE AVF with some eschar- sticking away from now, monitor closely ?Facial edema- AM of 10/15/21--> ? Delayed reaction to PLEX with FFP vs SVC- Mar reviewed, no new meds. Received Solumedrol/ benadryl/ famotidine, R EJ catheter removed.  D/w hospitalist.  Since pt has had her AKA and no bleeding > 48 hrs out, OK to convert PLEX to albumin replacement fluid. S/p L IJ trialysis HD cath in IR 3/6 by Dr. Vernard Gambles ?Ulcerated area AVF with  thinning skin: Asked VVS to see pt 10/20/2021. Sticking away from area ?Dispo - likely will need SNF placement/versus CIR  ? ?[name] H. Cam Dauphin NP-C ?10/20/2021, 9:00 AM  ?[facility] ?[phone] ? ? ?  ? ?

## 2021-10-21 DIAGNOSIS — I6340 Cerebral infarction due to embolism of unspecified cerebral artery: Secondary | ICD-10-CM | POA: Diagnosis not present

## 2021-10-21 LAB — GLUCOSE, CAPILLARY
Glucose-Capillary: 106 mg/dL — ABNORMAL HIGH (ref 70–99)
Glucose-Capillary: 96 mg/dL (ref 70–99)
Glucose-Capillary: 77 mg/dL (ref 70–99)
Glucose-Capillary: 90 mg/dL (ref 70–99)
Glucose-Capillary: 78 mg/dL (ref 70–99)

## 2021-10-21 LAB — BASIC METABOLIC PANEL
Anion gap: 12 (ref 5–15)
BUN: 46 mg/dL — ABNORMAL HIGH (ref 6–20)
CO2: 26 mmol/L (ref 22–32)
Calcium: 10.2 mg/dL (ref 8.9–10.3)
Chloride: 98 mmol/L (ref 98–111)
Creatinine, Ser: 4.42 mg/dL — ABNORMAL HIGH (ref 0.44–1.00)
GFR, Estimated: 12 mL/min — ABNORMAL LOW (ref >60)
Glucose, Bld: 83 mg/dL (ref 70–99)
Potassium: 3.6 mmol/L (ref 3.5–5.1)
Sodium: 136 mmol/L (ref 135–145)

## 2021-10-21 MED ORDER — CALCIUM CARBONATE ANTACID 500 MG PO CHEW: 2.0000 | CHEWABLE_TABLET | ORAL | Status: DC

## 2021-10-21 MED ORDER — ACD FORMULA A 0.73-2.45-2.2 GM/100ML VI SOLN
1000.0000 mL | Status: DC
  Administered 2021-10-21: 1000 mL
  Filled 2021-10-21: qty 1000

## 2021-10-21 MED ORDER — DIPHENHYDRAMINE HCL 50 MG/ML IJ SOLN
25.0000 mg | Freq: Once | INTRAMUSCULAR | Status: AC
  Administered 2021-10-21: 25 mg via INTRAVENOUS
  Filled 2021-10-21: qty 1

## 2021-10-21 MED ORDER — HEPARIN SODIUM (PORCINE) 1000 UNIT/ML IJ SOLN
1000.0000 [IU] | Freq: Once | INTRAMUSCULAR | Status: DC
  Filled 2021-10-21 (×2): qty 1

## 2021-10-21 MED ORDER — ACETAMINOPHEN 325 MG PO TABS: 650.0000 mg | ORAL_TABLET | ORAL | Status: DC | PRN

## 2021-10-21 MED ORDER — DIPHENHYDRAMINE HCL 25 MG PO CAPS: 25.0000 mg | ORAL_CAPSULE | Freq: Four times a day (QID) | ORAL | Status: DC | PRN

## 2021-10-21 MED ORDER — ACETAMINOPHEN 10 MG/ML IV SOLN
1000.0000 mg | Freq: Once | INTRAVENOUS | Status: AC
  Administered 2021-10-21: 1000 mg via INTRAVENOUS
  Filled 2021-10-21 (×2): qty 100

## 2021-10-21 MED ORDER — CALCIUM GLUCONATE-NACL 2-0.675 GM/100ML-% IV SOLN
2.0000 g | Freq: Once | INTRAVENOUS | Status: AC
  Administered 2021-10-21: 2000 mg via INTRAVENOUS
  Filled 2021-10-21: qty 100

## 2021-10-21 NOTE — Consult Note (Signed)
Chief Complaint: Dysphagia  Referring Physician(s): Shelly Coss, MD  Supervising Physician: Markus Daft  Patient Status: [facility] - In-pt  History of Present Illness: [name] is a 41 y.o. female with medical issues including poorly controlled DMT2, ESRD on TTS HD, HTN with poor compliance.  The dialysis staff sent her to the ED due to concern for altered mental status.    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    MRI of the brain showed scattered punctate acute infarcts bilaterally.  Neurology consulted.    She has had persistent dysphagia. Coretrak in place.  We are asked to evaluate for gastrostomy tube placement.  Past Medical History:  Diagnosis Date   Anemia    Anxiety    Blind right eye 2008   Diabetes mellitus without complication ([place])    Dialysis patient ([place])    ESRD (end stage renal disease) ([place])    Dialysis T/Th/Sa    Past Surgical History:  Procedure Laterality Date   AMPUTATION Right 08/11/2021   Procedure: TRANSMETATARSAL AMPUTATION OF RIGHT FOOT;  Surgeon: Serafina Mitchell, MD;  Location: [place];  Service: Vascular;  Laterality: Right;   AMPUTATION Right 09/17/2021   Procedure: AMPUTATION RIGHT BELOW KNEE;  Surgeon: Serafina Mitchell, MD;  Location: [place];  Service: Vascular;  Laterality: Right;   AMPUTATION Right 10/11/2021   Procedure: RIGHT ABOVE KNEE AMPUTATION;  Surgeon: Serafina Mitchell, MD;  Location: [place];  Service: Vascular;  Laterality: Right;   AMPUTATION TOE Left    APPLICATION OF WOUND VAC  08/16/2021   Procedure: APPLICATION OF WOUND VAC;  Surgeon: Serafina Mitchell, MD;  Location: [place];  Service: Vascular;;   AV FISTULA PLACEMENT     BUBBLE STUDY  10/02/2021   Procedure: BUBBLE STUDY;   Surgeon: Fay Records, MD;  Location: [place];  Service: Cardiovascular;;   CESAREAN SECTION  2011   FISTULA SUPERFICIALIZATION Right [id number]   Procedure: 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;  Surgeon: Angelia Mould, MD;  Location: [place];  Service: Vascular;  Laterality: Right;   INSERTION OF DIALYSIS CATHETER Left 07/27/2019   Procedure: INSERTION OF TUNNELED  DIALYSIS CATHETER;  Surgeon: Angelia Mould, MD;  Location: [place];  Service: Vascular;  Laterality: Left;   IR FLUORO GUIDE CV LINE LEFT  10/16/2021   IR FLUORO GUIDE CV LINE RIGHT  10/11/2021   IR US GUIDE VASC ACCESS LEFT  10/16/2021   IR US GUIDE VASC ACCESS RIGHT  10/11/2021   LOWER EXTREMITY ANGIOGRAPHY N/A 08/11/2021   Procedure: LOWER EXTREMITY ANGIOGRAPHY;  Surgeon: Serafina Mitchell, MD;  Location: [place] [facility];  Service: Cardiovascular;  Laterality: N/A;   TEE WITHOUT CARDIOVERSION N/A 10/02/2021   Procedure: TRANSESOPHAGEAL ECHOCARDIOGRAM (TEE);  Surgeon: Fay Records, MD;  Location: [facility];  Service: Cardiovascular;  Laterality: N/A;   TRANSMETATARSAL AMPUTATION Right 08/16/2021   Procedure: CLOSURE OF RIGHT TRANSMETATARSAL AMPUTATION;  Surgeon: Serafina Mitchell, MD;  Location: [place];  Service: Vascular;  Laterality: Right;    Allergies: Morphine, Peanut-containing drug products, Penicillins, and Chocolate  Medications: Prior to Admission medications   Medication Sig Start Date End Date Taking? Authorizing Provider  Biotin w/ Vitamins C & E (HAIR/SKIN/NAILS PO) Take 1 capsule  by mouth daily.   Yes [provider]  calcitRIOL (ROCALTROL) 0.25 MCG capsule Take 0.25 mcg by mouth 2 (two) times daily.    Yes [provider]  cinacalcet (SENSIPAR) 90 MG tablet Take 90 mg by mouth daily.  09/22/19  Yes [provider]  diphenhydramine-acetaminophen (TYLENOL PM) 25-500 MG TABS tablet Take 1 tablet by mouth at bedtime as needed (sleep).   Yes [provider]  dorzolamide-timolol (COSOPT) 22.3-6.8 MG/ML ophthalmic solution Place 1 drop into the left eye 2 (two) times daily. 03/30/21  Yes [provider]  lidocaine-prilocaine (EMLA) cream Apply 1 application topically as directed.  09/07/19  Yes [provider]  losartan (COZAAR) 50 MG tablet Take 1 tablet (50 mg total) by mouth daily. 07/02/20  Yes Pokhrel, Laxman, MD  multivitamin (RENA-VIT) TABS tablet Take 1 tablet by mouth daily. 07/18/19  Yes Fulp, Cammie, MD  oxyCODONE-acetaminophen (PERCOCET) 5-325 MG tablet Take 1 tablet by mouth every 6 (six) hours as needed for severe pain. 09/04/21  Yes Ulyses Amor, PA-C  Accu-Chek FastClix Lancets MISC Use as instructed to check blood sugar up to TID. E11.22 Patient taking differently: 1 each by Other route See admin instructions. Use as instructed to check blood sugar up to TID. E11.22 04/15/19   Charlott Rakes, MD  Blood Glucose Monitoring Suppl (ACCU-CHEK GUIDE ME) w/Device KIT 1 kit by Does not apply route 3 (three) times daily. Use to check BG at home up to 3 times daily. E11.22 04/15/19   Charlott Rakes, MD  Continuous Blood Gluc Receiver (DEXCOM G6 RECEIVER) DEVI 1 Device by Does not apply route as directed. 09/12/20   Shamleffer, Melanie Crazier, MD  Continuous Blood Gluc Sensor (DEXCOM G6 SENSOR) MISC 1 Device by Does not apply route as directed. 09/12/20   Shamleffer, Melanie Crazier, MD  Continuous Blood Gluc Transmit (DEXCOM G6 TRANSMITTER) MISC 1 Device by Does not apply route as directed. 09/12/20   Shamleffer, Melanie Crazier, MD  glucose blood (ACCU-CHEK GUIDE) test strip Use as instructed to check blood sugar up to TID. E11.22 Patient taking differently: 1 each by Other route See admin instructions. Use as instructed to check blood sugar up to TID. E11.22 07/17/19   Fulp, Cammie, MD  Insulin Pen Needle 32G X 4 MM MISC 1 Device by Does not apply route as directed. 05/13/20   Shamleffer, Melanie Crazier, MD     Family  History  Problem Relation Age of Onset   Diabetes Mother    Diabetes Father     Social History   Socioeconomic History   Marital status: Single    Spouse name: Not on file   Number of children: Not on file   Years of education: Not on file   Highest education level: Not on file  Occupational History   Not on file  Tobacco Use   Smoking status: Never   Smokeless tobacco: Never  Vaping Use   Vaping Use: Never used  Substance and Sexual Activity   Alcohol use: No   Drug use: No   Sexual activity: Not on file  Other Topics Concern   Not on file  Social History Narrative   Not on file   Social Determinants of Health   Financial Resource Strain: Not on file  Food Insecurity: Not on file  Transportation Needs: Not on file  Physical Activity: Not on file  Stress: Not on file  Social Connections: Not on file    Review of Systems  Unable  to perform ROS: Mental status change   Vital Signs: BP (!) 163/52 (BP Location: Left Arm)    Pulse 91    Temp 98.2 F (36.8 C) (Oral)    Resp 14    Ht _0  (1.549 m)    Wt 136 lb 11 oz (62 kg)    LMP 09/20/2021 Comment: Neg Preg Test 08/10/21. Has had a stroke and believed to have since been in the hospital .  TRK   SpO2 100%    BMI 25.83 kg/m   Physical Exam Vitals reviewed.  Constitutional:      Appearance: She is ill-appearing.  Cardiovascular:     Rate and Rhythm: Normal rate and regular rhythm.  Pulmonary:     Effort: Pulmonary effort is normal. No respiratory distress.     Breath sounds: Normal breath sounds.  Abdominal:     Palpations: Abdomen is soft.  Skin:    General: Skin is warm and dry.    Imaging: CT ANGIO HEAD NECK W WO CM  Result Date: 09/22/2021 CLINICAL DATA:  Neuro deficit, acute, stroke suspected. Encephalopathy. History of end-stage renal disease. EXAM: CT ANGIOGRAPHY HEAD AND NECK TECHNIQUE: Multidetector CT imaging of the head and neck was performed using the standard protocol during bolus administration  of intravenous contrast. Multiplanar CT image reconstructions and MIPs were obtained to evaluate the vascular anatomy. Carotid stenosis measurements (when applicable) are obtained utilizing NASCET criteria, using the distal internal carotid diameter as the denominator. RADIATION DOSE REDUCTION: This exam was performed according to the departmental dose-optimization program which includes automated exposure control, adjustment of the mA and/or kV according to patient size and/or use of iterative reconstruction technique. CONTRAST:  113m OMNIPAQUE IOHEXOL 350 MG/ML SOLN COMPARISON:  None. FINDINGS: CTA NECK FINDINGS Aortic arch: Normal variant aortic arch branching pattern with common origin of the brachiocephalic and left common carotid arteries. Minimal atherosclerotic plaque in the aortic arch without arch vessel origin stenosis. Right carotid system: Patent with minimal calcified plaque at the carotid bifurcation. No evidence of dissection or stenosis. Left carotid system: Patent without evidence of dissection or stenosis. Vertebral arteries: Patent with the left being moderately dominant. Minimal nonstenotic atherosclerotic plaque in the left V1 segment. Focal calcified plaque in the proximal left V2 segment results in mild stenosis. Skeleton: No acute osseous abnormality or suspicious osseous lesion. Other neck: No evidence of cervical lymphadenopathy or mass. Upper chest: Scattered small peribronchovascular densities in both upper lobes. Review of the MIP images confirms the above findings CTA HEAD FINDINGS Anterior circulation: The internal carotid arteries are patent from skull base to carotid termini with calcified plaque resulting in up to mild cavernous stenosis bilaterally as well as a moderate stenosis in the right paraclinoid region. ACAs and MCAs are patent without evidence of a proximal branch occlusion or significant proximal stenosis. No aneurysm is identified. Posterior circulation: The  intracranial vertebral arteries are widely patent to the basilar. Patent PICA, AICA, and SCA origins are seen bilaterally. The basilar artery is widely patent. There are small posterior communicating arteries bilaterally. Both PCAs are patent without evidence of a flow limiting proximal stenosis. There is mild narrowing of the proximal to mid right P2 segment. No aneurysm is identified. Venous sinuses: Patent. Anatomic variants: None. Review of the MIP images confirms the above findings IMPRESSION: 1. No large vessel occlusion. 2. Intracranial atherosclerosis including moderate right and mild left ICA stenoses. 3. Widely patent cervical carotid arteries. 4. Mild left V2 vertebral artery stenosis. 5. Scattered small  peribronchovascular pulmonary densities in both upper lobes, likely infectious or inflammatory. 6.  Aortic Atherosclerosis (ICD10-I70.0). Electronically Signed   By: Logan Bores M.D.   On: 09/22/2021 18:56   DG Abd 1 View  Result Date: 10/02/2021 CLINICAL DATA:  Dysphagia, feeding tube placement. EXAM: ABDOMEN - 1 VIEW COMPARISON:  Plain film dated 09/27/2021. FINDINGS: Weighted tip feeding tube in place with tip directed towards the expected location of the stomach pylorus/proximal duodenum. Visualized bowel gas pattern is nonobstructive. No evidence of free intraperitoneal air. Lung bases appear clear. IMPRESSION: Weighted tip feeding tube in the distal stomach with tip directed towards the expected location of the stomach pylorus/proximal duodenum. Electronically Signed   By: Franki Cabot M.D.   On: 10/02/2021 13:40   CT HEAD WO CONTRAST (5MM)  Result Date: 09/22/2021 CLINICAL DATA:  Delirium EXAM: CT HEAD WITHOUT CONTRAST TECHNIQUE: Contiguous axial images were obtained from the base of the skull through the vertex without intravenous contrast. RADIATION DOSE REDUCTION: This exam was performed according to the departmental dose-optimization program which includes automated exposure control,  adjustment of the mA and/or kV according to patient size and/or use of iterative reconstruction technique. COMPARISON:  Head CT and brain MRI 06/30/2020 FINDINGS: Brain: Chronic left cerebellar and left parietal infarcts. Premature small vessel ischemia. No evidence of acute infarct, hemorrhage, hydrocephalus, or mass. Vascular: Diffuse arterial calcification correlating with end-stage renal disease history. 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 Bilateral cataract resection. IMPRESSION: 1. No acute finding. 2. Chronic small vessel disease and infarcts. Electronically Signed   By: Jorje Guild M.D.   On: 09/22/2021 07:33   MR BRAIN WO CONTRAST  Result Date: 09/22/2021 CLINICAL DATA:  Delirium.  Stroke. EXAM: MRI HEAD WITHOUT CONTRAST TECHNIQUE: Multiplanar, multiecho pulse sequences of the brain and surrounding structures were obtained without intravenous contrast. COMPARISON:  Head CT and CTA 09/22/2021.  Head MRI 06/30/2020. FINDINGS: Brain: There are scattered punctate acute infarcts involving white matter greater than cortex in both cerebral hemispheres including both frontal lobes, temporal lobes, and parietal lobes as well as corpus callosum and fornix. More confluent signal abnormality in the left corona radiata with less pronounced diffusion restriction may indicate an early subacute infarct, and there also appear to be underlying chronic lacunar infarcts in the left corona radiata which are new from the prior MRI. Chronic infarcts are again noted in the body of the corpus callosum with associated wallerian degeneration extending into the white matter of both frontal lobes. There are also unchanged chronic infarcts in the left occipital lobe and inferior left cerebellar hemisphere. A chronic left thalamic lacunar infarct is new from the prior MRI. Additional periventricular white matter T2 hyperintensity is  similar to the prior MRI and nonspecific but likely indicative of chronic small vessel ischemia. Several scattered chronic cerebral microhemorrhages are noted. No mass, midline shift, or extra-axial fluid collection is identified. There is mild cerebral and cerebellar atrophy. Vascular: Major intracranial vascular flow voids are preserved. Skull and upper cervical spine: Diffusely diminished bone marrow T1 signal intensity which is nonspecific but likely related to known anemia and chronic renal disease. Sinuses/Orbits: Bilateral cataract extraction. Chronic right retinal detachment. Mild mucosal thickening in the maxillary sinuses. Small left mastoid effusion. Other: None. IMPRESSION: 1. Scattered punctate acute infarcts throughout both cerebral hemispheres which may be embolic. 2. More confluent region of potentially subacute infarction in the left corona radiata. 3. Chronic ischemia with multiple chronic infarcts as above.  Electronically Signed   By: Logan Bores M.D.   On: 09/22/2021 20:04   MR BRAIN W WO CONTRAST  Result Date: 10/07/2021 CLINICAL DATA:  Ophthalmoplegia EXAM: MRI HEAD AND ORBITS WITHOUT AND WITH CONTRAST TECHNIQUE: Multiplanar, multiecho pulse sequences of the brain and surrounding structures were obtained without and with intravenous contrast. Multiplanar, multiecho pulse sequences of the orbits and surrounding structures were obtained including fat saturation techniques, before and after intravenous contrast administration. CONTRAST:  81mL GADAVIST GADOBUTROL 1 MMOL/ML IV SOLN COMPARISON:  MRI brain 09/22/2021 FINDINGS: MRI HEAD FINDINGS Brain: Evolution of infarcts seen on the prior study. No acute infarct. Stable chronic infarcts and chronic microvascular ischemic changes. Scattered chronic microhemorrhages are again noted. Ventricles are stable in size. No intracranial mass or abnormal enhancement. Vascular: Major vessel flow voids at the skull base are preserved. Skull and upper  cervical spine: Mildly decreased T1 marrow signal likely reflects hematopoietic marrow. Other: Cavernous sinus enhancement is symmetric and unremarkable. Persistent left greater than right mastoid fluid opacification. Sella is unremarkable. MRI ORBITS FINDINGS Orbits: Bilateral lens replacements. Chronic right retinal detachment. Extraocular muscles are symmetric and unremarkable. No abnormal enhancement of the optic nerve sheath complexes. Visualized sinuses: Trace mucosal thickening. Soft tissues: Unremarkable. IMPRESSION: Evolution of infarcts seen on the prior study. No acute infarction, hemorrhage, or abnormal enhancement. Chronic right retinal detachment.  No acute orbital abnormality. Electronically Signed   By: Macy Mis M.D.   On: 10/07/2021 11:59   CT ABDOMEN PELVIS W CONTRAST  Result Date: 09/26/2021 CLINICAL DATA:  Abdominal pain, nonlocalized. Evaluate for rectovaginal fistula. EXAM: CT ABDOMEN AND PELVIS WITH CONTRAST TECHNIQUE: Multidetector CT imaging of the abdomen and pelvis was performed using the standard protocol following bolus administration of intravenous contrast. RADIATION DOSE REDUCTION: This exam was performed according to the departmental dose-optimization program which includes automated exposure control, adjustment of the mA and/or kV according to patient size and/or use of iterative reconstruction technique. CONTRAST:  133mL OMNIPAQUE IOHEXOL 300 MG/ML  SOLN COMPARISON:  None. FINDINGS: Lower chest: Mild atelectasis is noted at the lung bases. There is a slightly nodular opacity at the right lung base measuring 9 mm. Hepatobiliary: There are ill-defined hypodense regions in the left lobe of the liver. The gallbladder is without stones. There is suggestion of mild gallbladder wall thickening versus pericholecystic edema. Mild intrahepatic and extrahepatic biliary ductal dilatation is noted. The common bile duct measures 8 mm in diameter. Pancreas: An ill-defined hypodense  region is present in the tail of the pancreas. No surrounding inflammatory changes are identified. Spleen: Multifocal hypodensities are present in the spleen. Adrenals/Urinary Tract: The adrenal glands are within normal limits. Bilateral renal atrophy is noted. Vascular calcifications are noted at the renal hila bilaterally. No hydronephrosis. There is diffuse bladder wall thickening. Stomach/Bowel: An NG tube is present in the stomach. A rectal tube is in place. The appendix is normal in caliber. No bowel obstruction, free air, or pneumatosis. No definite evidence of rectovaginal fistula. No extravasation of rectal contrast is identified. Vascular/Lymphatic: There are extensive vascular calcifications. No abdominal or pelvic lymphadenopathy. Reproductive: Uterus and bilateral adnexa are within normal limits. Prominent vessels are noted at the uterus which may be associated with pelvic congestion syndrome. Other: Small fat containing umbilical hernia.  No ascites. Musculoskeletal: Subcutaneous fat stranding is noted in the anterior abdominal wall bilaterally which may be iatrogenic. No acute osseous abnormality. IMPRESSION: 1. No evidence of contrast extravasation from the rectum. No definite evidence of rectovaginal fistula. 2. Diffuse  bladder wall thickening, possible infectious or inflammatory cystitis. 3. Aortic atherosclerosis with extensive vascular calcifications. 4. Ill-defined hypodense regions in the spleen, possible splenic infarcts. Comparison with older imaging studies is recommended. 5. Ill-defined areas of low-attenuation in the left lobe of the liver. Differential diagnosis includes focal fatty infiltration versus other abnormality. Multiphase CT is recommended for further evaluation. 6. Intrahepatic and extrahepatic biliary ductal dilatation with suggestion of mild gallbladder wall thickening. Ultrasound is recommended for further evaluation. 7. Bilateral renal atrophy. 8. Mild atelectasis at the  lung bases. There is a 9 mm nodular density in the right lower lobe. Comparison with older imaging studies or follow-up is recommended. Electronically Signed   By: Brett Fairy M.D.   On: 09/26/2021 04:10   IR Fluoro Guide CV Line Left  Result Date: 10/16/2021 CLINICAL DATA:  Recent removal of right IJ catheter. Needs access for hemodialysis EXAM: EXAM LEFT IJ CATHETER PLACEMENT UNDER ULTRASOUND AND FLUOROSCOPIC GUIDANCE TECHNIQUE: The procedure, risks (including but not limited to bleeding, infection, organ damage, pneumothorax), benefits, and alternatives were explained to the patient. Questions regarding the procedure were encouraged and answered. The patient understands and consents to the procedure. Patency of the LEFT IJ vein was confirmed with ultrasound with image documentation. An appropriate skin site was determined. Skin site was marked. Region was prepped using maximum barrier technique including cap and mask, sterile gown, sterile gloves, large sterile sheet, and Chlorhexidine as cutaneous antisepsis. The region was infiltrated locally with 1% lidocaine. Under real-time ultrasound guidance, the LEFT IJ vein was accessed with a 21 gauge needle; the needle tip within the vein was confirmed with ultrasound image documentation. The needle exchanged over a guidewire for vascular dilator which allowed advancement of a 20 cm Trialysis catheter. This was positioned with the in the mid right atrium. Spot chest radiograph shows good positioning and no pneumothorax. Catheter was flushed and sutured externally with 0-Prolene sutures. Patient tolerated the procedure well. FLUOROSCOPY: Radiation Exposure Index (as provided by the fluoroscopic device): 1 mGy air Kerma COMPLICATIONS: COMPLICATIONS none IMPRESSION: 1. Technically successful left IJ Trialysis catheter placement. Electronically Signed   By: Lucrezia Europe M.D.   On: 10/16/2021 15:31   IR Fluoro Guide CV Line Right  Result Date: 10/11/2021 INDICATION:  42 year old female with history of Guillain-Barre syndrome requiring central venous access for PLEX. EXAM: NON-TUNNELED CENTRAL VENOUS HEMODIALYSIS CATHETER PLACEMENT WITH ULTRASOUND AND FLUOROSCOPIC GUIDANCE COMPARISON:  None. MEDICATIONS: None FLUOROSCOPY TIME:  0 minutes, 6 seconds (2 mGy) COMPLICATIONS: None immediate. PROCEDURE: Informed written consent was obtained from the patient after a discussion of the risks, benefits, and alternatives to treatment. Questions regarding the procedure were encouraged and answered. The right neck and chest were prepped with chlorhexidine in a sterile fashion, and a sterile drape was applied covering the operative field. Maximum barrier sterile technique with sterile gowns and gloves were used for the procedure. A timeout was performed prior to the initiation of the procedure. Preprocedure ultrasound evaluation demonstrated occlusion of the right internal jugular vein. The right external jugular vein was patent. After the overlying soft tissues were anesthetized, a small venotomy incision was created and a micropuncture kit was utilized to access the right external jugular vein. Real-time ultrasound guidance was utilized for vascular access including the acquisition of a permanent ultrasound image documenting patency of the accessed vessel. The microwire was utilized to measure appropriate catheter length. A Rosen wire was advanced to the level of the IVC. Under fluoroscopic guidance, the venotomy was serially dilated, ultimately  allowing placement of a 20 cm temporary Trialysis catheter with tip ultimately terminating within the superior aspect of the right atrium. Final catheter positioning was confirmed and documented with a spot radiographic image. The catheter aspirates and flushes normally. The catheter was flushed with appropriate volume heparin dwells. The catheter exit site was secured with a 0 silk retention suture. A dressing was placed. The patient tolerated the  procedure well without immediate post procedural complication. IMPRESSION: 1. Successful placement of a right external jugular vein approach 20 cm temporary dialysis catheter with tip terminating with in the superior aspect of the right atrium. The catheter is ready for immediate use. 2. 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 Ruthann Cancer, MD [facility] Electronically Signed   By: Ruthann Cancer M.D.   On: 10/11/2021 08:52   IR US Guide Vasc Access Left  Result Date: 10/16/2021 CLINICAL DATA:  Recent removal of right IJ catheter. Needs access for hemodialysis EXAM: EXAM LEFT IJ CATHETER PLACEMENT UNDER ULTRASOUND AND FLUOROSCOPIC GUIDANCE TECHNIQUE: The procedure, risks (including but not limited to bleeding, infection, organ damage, pneumothorax), benefits, and alternatives were explained to the patient. Questions regarding the procedure were encouraged and answered. The patient understands and consents to the procedure. Patency of the LEFT IJ vein was confirmed with ultrasound with image documentation. An appropriate skin site was determined. Skin site was marked. Region was prepped using maximum barrier technique including cap and mask, sterile gown, sterile gloves, large sterile sheet, and Chlorhexidine as cutaneous antisepsis. The region was infiltrated locally with 1% lidocaine. Under real-time ultrasound guidance, the LEFT IJ vein was accessed with a 21 gauge needle; the needle tip within the vein was confirmed with ultrasound image documentation. The needle exchanged over a guidewire for vascular dilator which allowed advancement of a 20 cm Trialysis catheter. This was positioned with the in the mid right atrium. Spot chest radiograph shows good positioning and no pneumothorax. Catheter was flushed and sutured externally with 0-Prolene sutures. Patient tolerated the procedure well. FLUOROSCOPY: Radiation Exposure Index (as provided  by the fluoroscopic device): 1 mGy air Kerma COMPLICATIONS: COMPLICATIONS none IMPRESSION: 1. Technically successful left IJ Trialysis catheter placement. Electronically Signed   By: Lucrezia Europe M.D.   On: 10/16/2021 15:31   IR US Guide Vasc Access Right  Result Date: 10/11/2021 INDICATION: 41 year old female with history of Guillain-Barre syndrome requiring central venous access for PLEX. EXAM: NON-TUNNELED CENTRAL VENOUS HEMODIALYSIS CATHETER PLACEMENT WITH ULTRASOUND AND FLUOROSCOPIC GUIDANCE COMPARISON:  None. MEDICATIONS: None FLUOROSCOPY TIME:  0 minutes, 6 seconds (2 mGy) COMPLICATIONS: None immediate. PROCEDURE: Informed written consent was obtained from the patient after a discussion of the risks, benefits, and alternatives to treatment. Questions regarding the procedure were encouraged and answered. The right neck and chest were prepped with chlorhexidine in a sterile fashion, and a sterile drape was applied covering the operative field. Maximum barrier sterile technique with sterile gowns and gloves were used for the procedure. A timeout was performed prior to the initiation of the procedure. Preprocedure ultrasound evaluation demonstrated occlusion of the right internal jugular vein. The right external jugular vein was patent. After the overlying soft tissues were anesthetized, a small venotomy incision was created and a micropuncture kit was utilized to access the right external jugular vein. Real-time ultrasound guidance was utilized for vascular access including the acquisition of a permanent ultrasound image documenting patency of the accessed vessel. The microwire was utilized to measure appropriate catheter length. A Rosen wire  was advanced to the level of the IVC. Under fluoroscopic guidance, the venotomy was serially dilated, ultimately allowing placement of a 20 cm temporary Trialysis catheter with tip ultimately terminating within the superior aspect of the right atrium. Final catheter  positioning was confirmed and documented with a spot radiographic image. The catheter aspirates and flushes normally. The catheter was flushed with appropriate volume heparin dwells. The catheter exit site was secured with a 0 silk retention suture. A dressing was placed. The patient tolerated the procedure well without immediate post procedural complication. IMPRESSION: 1. Successful placement of a right external jugular vein approach 20 cm temporary dialysis catheter with tip terminating with in the superior aspect of the right atrium. The catheter is ready for immediate use. 2. 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 Ruthann Cancer, MD [facility] Electronically Signed   By: Ruthann Cancer M.D.   On: 10/11/2021 08:52   DG CHEST PORT 1 VIEW  Result Date: 10/20/2021 CLINICAL DATA:  Shortness of breath.  Recent stroke.  On dialysis. EXAM: PORTABLE CHEST 1 VIEW COMPARISON:  10/15/2021 FINDINGS: A left-sided dialysis catheter terminates at the mid right atrium. Feeding tube extends beyond the inferior aspect of the film. Numerous leads and wires project over the chest. Midline trachea. Borderline cardiomegaly. No pleural effusion or pneumothorax. No congestive failure. Mild pulmonary interstitial prominence is slightly asymmetric, greater left than right. IMPRESSION: Development of mild pulmonary interstitial prominence. Although this could be within normal variation given AP portable technique, pulmonary venous congestion is suspected. No overt congestive failure. Electronically Signed   By: Abigail Miyamoto M.D.   On: 10/20/2021 14:40   DG Chest Port 1 View  Result Date: 10/15/2021 CLINICAL DATA:  41 year old female status post removal of vascular catheter. Evaluate for pneumothorax. EXAM: PORTABLE CHEST 1 VIEW COMPARISON:  Chest x-ray 09/22/2021. FINDINGS: A feeding tube is seen extending into the abdomen, however, the tip of the feeding tube  extends below the lower margin of the image. Lung volumes are normal. No consolidative airspace disease. No pleural effusions. No pneumothorax. No pulmonary nodule or mass noted. Pulmonary vasculature and the cardiomediastinal silhouette are within normal limits. IMPRESSION: 1. No pneumothorax. No radiographic evidence of acute cardiopulmonary disease. Electronically Signed   By: Vinnie Langton M.D.   On: 10/15/2021 10:03   DG CHEST PORT 1 VIEW  Result Date: 09/22/2021 CLINICAL DATA:  Altered mental status EXAM: PORTABLE CHEST 1 VIEW COMPARISON:  Twelfth 05/02/2021 FINDINGS: The heart size and mediastinal contours are within normal limits. Both lungs are clear. The visualized skeletal structures are unremarkable. Age advanced atherosclerotic vascular calcifications are evident. IMPRESSION: No active disease. Electronically Signed   By: Davina Poke D.O.   On: 09/22/2021 10:32   DG Abd Portable 1V  Result Date: 09/27/2021 CLINICAL DATA:  Feeding tube placement. EXAM: PORTABLE ABDOMEN - 1 VIEW COMPARISON:  09/23/2021 FINDINGS: 1510 hours. Feeding tube tip is in the distal/antral stomach and directed towards the pylorus. Bowel gas pattern is nonspecific. IMPRESSION: Feeding tube tip is in the distal stomach directed towards the pylorus. Electronically Signed   By: Misty Stanley M.D.   On: 09/27/2021 15:24   DG Abd Portable 1V  Result Date: 09/23/2021 CLINICAL DATA:  NG tube placement EXAM: PORTABLE ABDOMEN - 1 VIEW COMPARISON:  09/22/2021 FINDINGS: Enteric tube terminates in the distal gastric body. IMPRESSION: Enteric tube terminates in the distal gastric body. Electronically Signed   By: Julian Hy M.D.   On:  09/23/2021 19:59   DG Abd Portable 1V  Result Date: 09/23/2021 CLINICAL DATA:  Encounter for NG tube placement. EXAM: PORTABLE ABDOMEN - 1 VIEW COMPARISON:  Earlier today FINDINGS: NG tube tip and side port are well below the level of the GE junction. The tip is in the expected  location of the distal body of stomach. IMPRESSION: Satisfactory position of nasogastric tube with tip in the distal body of the stomach. Electronically Signed   By: Kerby Moors M.D.   On: 09/23/2021 17:36   DG Abd Portable 1V  Result Date: 09/22/2021 CLINICAL DATA:  Altered mental status of unknown origin EXAM: PORTABLE ABDOMEN - 1 VIEW COMPARISON:  None. FINDINGS: The bowel gas pattern is normal. No radio-opaque calculi or other significant radiographic abnormality are seen. Extensive age advanced atherosclerotic vascular calcifications throughout the abdomen and pelvis. IMPRESSION: 1. Nonobstructive bowel gas pattern. 2. Extensive age advanced atherosclerotic vascular calcifications throughout the abdomen and pelvis. Electronically Signed   By: Davina Poke D.O.   On: 09/22/2021 10:35   EEG adult  Result Date: 09/22/2021 Catha Gosselin, DO     09/22/2021  8:55 PM [facility] Routine EEG Report Patient Name:   Gloristine, Turrubiates Date of Birth:   06/20/1981 Identification Number:   MRN - [id number] Date of Study:   09/22/2021 12:23:19 Duration: 24 minutes Indication: Encephalopathy Technical Summary: A routine 20 channel electroencephalogram using the international 10-20 system of electrode placement was performed. Background: 5-6 Hz, Poorly formed States : Coma Abnormalities - Generalized Slowing: Diffuse generalized slowing - Background Slowing: The background consists of 20-50 uV, 5-6 Hz diffuse activity with superimposed diffuse polymorphic delta activity that is non reactive to external stimulation. - Focal Slowing: Focal slowing is seen intermittently in the right centrotemporal region. Activation Procedures: Hyperventilation and Photic Stimulation: Not performed Classification: Abnormal Clinical Correlation: This EEG is abnormal due to 1) mild generalized slowing. This is a nonspecific finding but is consistent with a generalized disturbance of  cerebral function and may be seen in toxic, metabolic, post anoxic, multifocal or diffuse structural abnormalities. 2) Focal slowing is seen intermittently in the right centrotemporal region. This is a finding that may be consistent with a focal disturbance of cerebral function such as her recent finding of b/l ischemic events 3) No electrographic seizures were recorded. Clinical correlation is recommended. Dr Annabelle Harman [facility] [phone] Case [id number]  ECHO TEE  Result Date: 10/02/2021    TRANSESOPHOGEAL ECHO REPORT   Patient Name:   SHENIECE RUGGLES Date of Exam: 10/02/2021 Medical Rec #:  [id number]     Height:       61.0 in Accession #:    [id number]    Weight:       128.7 lb Date of Birth:  June 06, 1981      BSA:          1.566 m Patient Age:    25 years      BP:           160/83 mmHg Patient Gender: F             HR:           101 bpm. Exam Location:  Inpatient Procedure: 3D Echo, Transesophageal Echo, Cardiac Doppler and Color Doppler Indications:     Bacteremia. PFO  History:         Patient has no prior history of Echocardiogram examinations.  Stroke, Signs/Symptoms:Altered Mental Status; Risk                  Factors:Diabetes and Hypertension. PFO. ESRD.  Sonographer:     Roseanna Rainbow RDCS Referring Phys:  [id number] Margie Billet Diagnosing Phys: [name] MD PROCEDURE: After discussion of the risks and benefits of a TEE, an informed consent was obtained. The transesophogeal probe was passed without difficulty through the esophogus of the patient. Imaged were obtained with the patient in a left lateral decubitus position. Sedation performed by different physician. The patient was monitored while under deep sedation. Anesthestetic sedation was provided intravenously by Anesthesiology: 19m of Propofol, 425mof Lidocaine. The patient developed no complications during the procedure. IMPRESSIONS  1. Left ventricular ejection fraction, by estimation, is 70 to 75%. The left  ventricle has hyperdynamic function.  2. Right ventricular systolic function is normal. The right ventricular size is normal.  3. No left atrial/left atrial appendage thrombus was detected.  4. The mitral valve is normal in structure. Mild mitral valve regurgitation.  5. The aortic valve is tricuspid. Aortic valve regurgitation is not visualized. Aortic valve sclerosis is present, with no evidence of aortic valve stenosis.  6. Small PFO. Very small PFO as tested with injection of agitated saline wit ha few bubbles seen in LA. Not clearly visible with color doppler . There is a small patent foramen ovale. FINDINGS  Left Ventricle: Left ventricular ejection fraction, by estimation, is 70 to 75%. The left ventricle has hyperdynamic function. The left ventricular internal cavity size was normal in size. Right Ventricle: The right ventricular size is normal. Right vetricular wall thickness was not assessed. Right ventricular systolic function is normal. Left Atrium: Left atrial size was normal in size. No left atrial/left atrial appendage thrombus was detected. Right Atrium: Right atrial size was normal in size. Pericardium: Trivial pericardial effusion is present. Mitral Valve: The mitral valve is normal in structure. Mild mitral valve regurgitation. Tricuspid Valve: The tricuspid valve is normal in structure. Tricuspid valve regurgitation is not demonstrated. Aortic Valve: The aortic valve is tricuspid. Aortic valve regurgitation is not visualized. Aortic valve sclerosis is present, with no evidence of aortic valve stenosis. Pulmonic Valve: The pulmonic valve was normal in structure. Pulmonic valve regurgitation is not visualized. Aorta: The aortic root is normal in size and structure. IAS/Shunts: Small PFO. Agitated saline contrast was given intravenously to evaluate for intracardiac shunting. A small patent foramen ovale is detected. Very small PFO as tested with injection of agitated saline wit ha few bubbles seen  in LA. Not clearly  visible with color doppler. PaDorris CarnesD Electronically signed by PaDorris CarnesD Signature Date/Time: 10/02/2021/2:50:53 PM    Final    ECHOCARDIOGRAM COMPLETE BUBBLE STUDY  Result Date: 09/23/2021    ECHOCARDIOGRAM REPORT   Patient Name:   ALDARRELL LEONHARDTate of Exam: 09/23/2021 Medical Rec #:  [id number]   Height:       61.0 in Accession #:    [id number]  Weight:       125.7 lb Date of Birth:  11/1980/03/18    BSA:          1.550 m Patient Age:    [age]      BP:           145/54 mmHg Patient Gender: F             HR:  77 bpm. Exam Location:  Inpatient Procedure: 2D Echo Indications:    stroke  History:        Patient has no prior history of Echocardiogram examinations. End                 stage renal disease; Risk Factors:Diabetes.  Sonographer:    Johny Chess RDCS Referring Phys: [place]  1. Left ventricular ejection fraction, by estimation, is 70 to 75%. The left ventricle has hyperdynamic function. The left ventricle has no regional wall motion abnormalities. There is mild concentric left ventricular hypertrophy. Left ventricular diastolic parameters are indeterminate.  2. Right ventricular systolic function is normal. The right ventricular size is normal.  3. The mitral valve is degenerative. No evidence of mitral valve regurgitation. No evidence of mitral stenosis.  4. Possible bicuspid aortic valve. The aortic valve is calcified. Aortic valve sclerosis/calcification is present, without any evidence of aortic stenosis.     Aortic valve regurgitation is not visualized. No aortic stenosis is present.  5. The inferior vena cava is normal in size with greater than 50% respiratory variability, suggesting right atrial pressure of 3 mmHg.  6. Evidence of atrial level shunting detected by color flow Doppler. Agitated saline contrast bubble study was positive with shunting observed within 3-6 cardiac cycles suggestive of interatrial shunt. FINDINGS  Left  Ventricle: Left ventricular ejection fraction, by estimation, is 70 to 75%. The left ventricle has hyperdynamic function. The left ventricle has no regional wall motion abnormalities. The left ventricular internal cavity size was normal in size. There is mild concentric left ventricular hypertrophy. Left ventricular diastolic parameters are indeterminate. Normal left ventricular filling pressure. Right Ventricle: The right ventricular size is normal. No increase in right ventricular wall thickness. Right ventricular systolic function is normal. Left Atrium: Left atrial size was normal in size. Right Atrium: Right atrial size was normal in size. Pericardium: There is no evidence of pericardial effusion. Mitral Valve: The mitral valve is degenerative in appearance. There is mild calcification of the mitral valve leaflet(s). Mild to moderate mitral annular calcification. No evidence of mitral valve regurgitation. No evidence of mitral valve stenosis. Tricuspid Valve: The tricuspid valve is normal in structure. Tricuspid valve regurgitation is trivial. No evidence of tricuspid stenosis. Aortic Valve: Possible bicuspid aortic valve. The aortic valve is calcified. Aortic valve regurgitation is not visualized. Aortic valve sclerosis/calcification is present, without any evidence of aortic stenosis. Pulmonic Valve: The pulmonic valve was normal in structure. Pulmonic valve regurgitation is not visualized. No evidence of pulmonic stenosis. Aorta: The aortic root is normal in size and structure. Venous: The inferior vena cava is normal in size with greater than 50% respiratory variability, suggesting right atrial pressure of 3 mmHg. IAS/Shunts: Evidence of atrial level shunting detected by color flow Doppler. Agitated saline contrast was given intravenously to evaluate for intracardiac shunting. Agitated saline contrast bubble study was positive with shunting observed within 3-6 cardiac cycles suggestive of interatrial shunt.   LEFT VENTRICLE PLAX 2D LVIDd:         3.30 cm   Diastology LVIDs:         2.00 cm   LV e' medial:    7.29 cm/s LV PW:         1.10 cm   LV E/e' medial:  8.0 LV IVS:        1.10 cm   LV e' lateral:   12.40 cm/s LVOT diam:     1.60 cm   LV  E/e' lateral: 4.7 LV SV:         51 LV SV Index:   33 LVOT Area:     2.01 cm  RIGHT VENTRICLE             IVC RV S prime:     10.30 cm/s  IVC diam: 1.10 cm TAPSE (M-mode): 1.4 cm LEFT ATRIUM             Index        RIGHT ATRIUM          Index LA diam:        2.60 cm 1.68 cm/m   RA Area:     7.73 cm LA Vol (A2C):   30.0 ml 19.35 ml/m  RA Volume:   14.00 ml 9.03 ml/m LA Vol (A4C):   23.9 ml 15.42 ml/m LA Biplane Vol: 27.3 ml 17.61 ml/m  AORTIC VALVE LVOT Vmax:   150.00 cm/s LVOT Vmean:  93.000 cm/s LVOT VTI:    0.254 m  AORTA Ao Root diam: 2.70 cm Ao Asc diam:  2.70 cm MITRAL VALVE MV Area (PHT): 2.54 cm    SHUNTS MV Decel Time: 299 msec    Systemic VTI:  0.25 m MV E velocity: 58.30 cm/s  Systemic Diam: 1.60 cm MV A velocity: 45.40 cm/s MV E/A ratio:  1.28 Fransico Him MD Electronically signed by Fransico Him MD Signature Date/Time: 09/23/2021/12:40:30 PM    Final    VAS [place] LOWER EXTREMITY VENOUS (DVT)  Result Date: 09/25/2021  Lower Venous DVT Study Patient Name:  EVELISE REINE  Date of Exam:   09/24/2021 Medical Rec #: [id number]      Accession #:    [id number] Date of Birth: December 04, 1980       Patient Gender: F Patient Age:   3 years Exam Location:  [facility] Procedure:      VAS [place] LOWER EXTREMITY VENOUS (DVT) Referring Phys: Janine Ores --------------------------------------------------------------------------------  Indications: Stroke, positive bubble study. Other Indications: S/P right BKA. Comparison Study: No prior studies. Performing Technologist: Darlin Coco RDMS, RVT  Examination Guidelines: A complete evaluation includes B-mode imaging, spectral Doppler, color Doppler, and power Doppler as needed of all accessible portions of each vessel. Bilateral  testing is considered an integral part of a complete examination. Limited examinations for reoccurring indications may be performed as noted. The reflux portion of the exam is performed with the patient in reverse Trendelenburg.  +---------+---------------+---------+-----------+----------+--------------+  RIGHT     Compressibility Phasicity Spontaneity Properties Thrombus Aging  +---------+---------------+---------+-----------+----------+--------------+  CFV       Full            Yes       Yes                                    +---------+---------------+---------+-----------+----------+--------------+  SFJ       Full                                                             +---------+---------------+---------+-----------+----------+--------------+  FV Prox   Full                                                             +---------+---------------+---------+-----------+----------+--------------+  FV Mid    Full                                                             +---------+---------------+---------+-----------+----------+--------------+  FV Distal Full                                                             +---------+---------------+---------+-----------+----------+--------------+  PFV       Full                                                             +---------+---------------+---------+-----------+----------+--------------+  POP       Full                                                             +---------+---------------+---------+-----------+----------+--------------+  PTV                                                        BKA             +---------+---------------+---------+-----------+----------+--------------+  PERO                                                       BKA             +---------+---------------+---------+-----------+----------+--------------+   +---------+---------------+---------+-----------+----------+--------------+  LEFT       Compressibility Phasicity Spontaneity Properties Thrombus Aging  +---------+---------------+---------+-----------+----------+--------------+  CFV       Full            Yes       Yes                                    +---------+---------------+---------+-----------+----------+--------------+  SFJ       Full                                                             +---------+---------------+---------+-----------+----------+--------------+  FV Prox   Full                                                             +---------+---------------+---------+-----------+----------+--------------+  FV Mid    Full                                                             +---------+---------------+---------+-----------+----------+--------------+  FV Distal Full                                                             +---------+---------------+---------+-----------+----------+--------------+  PFV       Full                                                             +---------+---------------+---------+-----------+----------+--------------+  POP       Full            Yes       Yes                                    +---------+---------------+---------+-----------+----------+--------------+  PTV       Full                                                             +---------+---------------+---------+-----------+----------+--------------+  PERO      Full                                                             +---------+---------------+---------+-----------+----------+--------------+     Summary: RIGHT: - There is no evidence of deep vein thrombosis in the lower extremity.  - No cystic structure found in the popliteal fossa.  LEFT: - There is no evidence of deep vein thrombosis in the lower extremity.  - No cystic structure found in the popliteal fossa.  *See table(s) above for measurements and observations. Electronically signed by Servando Snare MD on 09/25/2021 at 4:02:46 PM.    Final    MR ORBITS W WO  CONTRAST  Result Date: 10/07/2021 CLINICAL DATA:  Ophthalmoplegia EXAM: MRI HEAD AND ORBITS WITHOUT AND WITH CONTRAST TECHNIQUE: Multiplanar, multiecho pulse sequences of the brain and surrounding structures were obtained without and with intravenous contrast. Multiplanar, multiecho pulse sequences of the orbits and surrounding structures were obtained including fat saturation techniques, before and after intravenous contrast administration. CONTRAST:  84m GADAVIST GADOBUTROL 1 MMOL/ML IV SOLN COMPARISON:  MRI brain 09/22/2021 FINDINGS: MRI HEAD FINDINGS Brain: Evolution of infarcts seen on the prior study. No acute infarct. Stable chronic infarcts and chronic microvascular ischemic changes. Scattered chronic microhemorrhages are again noted. Ventricles are stable in size. No intracranial mass  or abnormal enhancement. Vascular: Major vessel flow voids at the skull base are preserved. Skull and upper cervical spine: Mildly decreased T1 marrow signal likely reflects hematopoietic marrow. Other: Cavernous sinus enhancement is symmetric and unremarkable. Persistent left greater than right mastoid fluid opacification. Sella is unremarkable. MRI ORBITS FINDINGS Orbits: Bilateral lens replacements. Chronic right retinal detachment. Extraocular muscles are symmetric and unremarkable. No abnormal enhancement of the optic nerve sheath complexes. Visualized sinuses: Trace mucosal thickening. Soft tissues: Unremarkable. IMPRESSION: Evolution of infarcts seen on the prior study. No acute infarction, hemorrhage, or abnormal enhancement. Chronic right retinal detachment.  No acute orbital abnormality. Electronically Signed   By: Macy Mis M.D.   On: 10/07/2021 11:59   US Abdomen Limited RUQ (LIVER/GB)  Result Date: 09/26/2021 CLINICAL DATA:  Question choledocholithiasis, abnormal CT EXAM: ULTRASOUND ABDOMEN LIMITED RIGHT UPPER QUADRANT COMPARISON:  CT 09/26/2021 FINDINGS: Gallbladder: Gallbladder slightly contracted.  Sludge without shadowing stones. Fold at the fundus. Increased wall thickness up to 4.6 mm. Negative sonographic Murphy. Common bile duct: Diameter: Mildly dilated at 9.8 mm. Liver: No focal lesion identified. Within normal limits in parenchymal echogenicity. Portal vein is patent on color Doppler imaging with normal direction of blood flow towards the liver. Other: None. IMPRESSION: 1. Gallbladder sludge with increased wall thickness but negative sonographic Murphy. Correlation with nuclear medicine hepatobiliary imaging may be obtained if there is concern for acute gallbladder disease. 2. Dilated common bile duct up to 9.8 mm. Consider correlation with MRCP. Electronically Signed   By: Donavan Foil M.D.   On: 09/26/2021 20:22    Labs:  CBC: Recent Labs    10/14/21 0218 10/16/21 1407 10/18/21 0650 10/20/21 0345  WBC 16.2* 12.5* 12.4* 11.9*  HGB 8.8* 8.3* 9.3* 8.8*  HCT 29.8* 28.3* 32.1* 30.6*  PLT 286 354 320 268    COAGS: Recent Labs    08/10/21 2212  INR 1.2  APTT 28    BMP: Recent Labs    10/16/21 1406 10/18/21 0650 10/19/21 0144 10/20/21 0345  NA 134* 139 137 138  K 4.0 3.7 3.5 3.8  CL 97* 96* 98 95*  CO2 26 32 31 30  GLUCOSE 145* 150* 207* 269*  BUN 42* 50* 33* 60*  CALCIUM 9.3 10.4* 9.4 10.2  CREATININE 5.77* 4.70* 3.76* 4.62*  GFRNONAA 9* 11* 15* 12*    LIVER FUNCTION TESTS: Recent Labs    09/27/21 0129 09/30/21 0838 10/03/21 0804 10/05/21 0807 10/09/21 1204 10/10/21 1418 10/12/21 0805 10/16/21 1406 10/18/21 0650  BILITOT 0.7  --  0.6  --  0.3  --   --   --  0.4  AST 58*  --  24  --  20  --   --   --  20  ALT 33  --  15  --  15  --   --   --  15  ALKPHOS 110  --  92  --  96  --   --   --  59  PROT 7.8  --  7.2  --  7.3  --   --   --  6.1*  ALBUMIN 1.9*   < > 2.0*   < > 2.1* 2.1* 2.9* 2.2* 2.6*   < > = values in this interval not displayed.    TUMOR MARKERS: No results for input(s): AFPTM, CEA, CA199, CHROMGRNA in the last 8760  hours.  Assessment and Plan:  Dysphagia  Images reviewed by Dr. Anselm Pancoast.  Will plan for Gastrostomy tube placement of  Friday due to aspirin and Plavix which needs to be held x 5 days per IR anticoagulation guidelines.  She will need Omnipaque via the Cortrak the night before.  Risks and benefits image guided gastrostomy tube placement was discussed with the patient's sister including, but not limited to the need for a barium enema during the procedure, bleeding, infection, peritonitis and/or damage to adjacent structures.  All questions were answered, patient is agreeable to proceed.  Consent signed and in chart.  Thank you for allowing our service to participate in [name] 's care.  Electronically Signed: Murrell Redden, PA-C   10/21/2021, 1:00 PM      I spent a total of   25 Minutes in face to face in clinical consultation, greater than 50% of which was counseling/coordinating care for gastrostomy tube placement.

## 2021-10-21 NOTE — Progress Notes (Signed)
?  ?  Plan is to revise the fistula in the OR with Dr. Carlis Abbott on Monday.  We will need to be n.p.o. past midnight we will place orders tomorrow.  If there are other issues please contact me over the weekend. ? ?Mylie Mccurley C. Donzetta Matters, MD ?[facility] ?Office: [phone] ?Pager: [phone] ? ?

## 2021-10-21 NOTE — Progress Notes (Signed)
?   10/21/21 1206  ?Provider Notification  ?Provider Name/Title Dr. Tawanna Solo  ?Date Provider Notified 10/21/21  ?Time Provider Notified 1201  ?Notification Type Page  ?Notification Reason Other (Comment) ?(core track NG clogged. Unable to give tube feeds or oral meds. CBG trending down. Change IVF order?)  ?Provider response See new orders ?(increase D5LR to 50cc/hr)  ?Date of Provider Response 10/21/21  ?Time of Provider Response 1206  ? ? ?

## 2021-10-21 NOTE — Progress Notes (Signed)
PROGRESS NOTE  [name]  KGM:[id number] DOB: [birth year] DOA: 09/15/2021 PCP: [facility]   Brief Narrative:  [name] is a 41 y.o. female with medical history significant for poorly controlled DMT2, ESRD on TTS HD, HTN with poor compliance with medical treatment and HD was sent after completing dialysis session due to staff concern for altered mental status.  She has a recent history of TMA of the right foot about 2 months ago, grew MSSA and was on Ancef with dialysis.  Vascular surgery as well as nephrology consulted.  Hospital course complicated by an episode of lethargy, minimal responsiveness on 2/10 requiring rapid response.  An MRI of the brain showed scattered punctate acute infarcts bilaterally.  Neurology consulted.  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  Neurology recommends Plex, status post HD catheter placed.  Received Plex 3/1, 3/3, 3/4 ,3/7,3/9.  Hospital course remarkable for persistent dysphagia needing tube feeding by core track, plan for PEG tube placement.   Assessment & Plan:  Principal Problem:   Cerebral embolism with cerebral infarction Active Problems:   GBS (Guillain-Barre syndrome) (HCC)   Anemia of chronic disease   Hyperglycemia due to type 2 diabetes mellitus (HCC)   ESRD on hemodialysis (HCC)   Acute encephalopathy   Osteomyelitis (HCC)   Pressure injury of skin   PFO (patent foramen ovale)   Malnutrition of moderate degree   Ptosis of both eyelids   Hypotension   Assessment and Plan: * Cerebral embolism with cerebral infarction Embolic stroke, with positive bubble study -neurology consulted and following.  An MRI of the brain 2/10 showed scattered punctate acute infarcts throughout both cerebral hemispheres which appeared to be embolic. A transesophageal echo done on 2/20 showed small PFO with no thrombus.  Completed stroke work-up.  Currently she is on statin,  aspirin and Plavix. Neurology recommended 30-day cardiac event monitoring as an outpatient  GBS (Guillain-Barre syndrome) [facility]) Hospital course remarkable for bilateral ptosis, facial diplegia, ophthalmoplegia, hyperreflexia.  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  MRI of the brain did not show any intracranial mass or abnormal enhancement to explain patient's ptosis, ophthalmoplegia.  MRI of the orbits symmetric extraocular muscles, no abnormal enhancement of the optic nerve sheath.  Neurology recommended Plex. Per neurology, had catheter placed 3/1, started Plex.  Done on 3/1, 3/3, 3/4, 3/7 ,3/9 , neurology were following. Session 5/5.I think we can  discontinue the central line. Neurology recommends EMG/nerve function test as an outpatient, outpatient neurology follow-up.  Hypotension Currently blood pressure stable.  Malnutrition of moderate degree -has a core track in place.  Encourage p.o. intake. Dietitian following. speech therapy following her,continues to recommend NPO.She has agreed for PEG  Pressure injury of skin Pressure Injury 09/24/21 Sacrum Right;Left;Medial Stage 2 -  Partial thickness loss of dermis presenting as a shallow open injury with a red, pink wound bed without slough. (Active)  09/24/21 1600  Location: Sacrum  Location Orientation: Right;Left;Medial  Staging: Stage 2 -  Partial thickness loss of dermis presenting as a shallow open injury with a red, pink wound bed without slough.  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  Wound looks good  Acute encephalopathy Encephalopathy has improved.  Currently alert and oriented  ESRD on hemodialysis [facility]) Nephrology following for dialysis.  Hyperglycemia due to type 2 diabetes mellitus  ([place]) continue glargine and sliding scale.  .  Monitor blood sugars. continue current regimen  Anemia of chronic disease Secondary to chronic kidney disease.  Currently hemoglobin stable.  Monitor         Nutrition Problem: Moderate Malnutrition Etiology: acute illness (osteomyelitis of R foot)    DVT prophylaxis:heparin injection 5,000 Units Start: 10/09/21 1400 SCD's Start: 09/17/21 1002     Code Status: Full Code  Family Communication: Called husband twice on 3/9,calls not received  Patient status:Inpatient  Patient is from :Home  Anticipated discharge to:CIR  Estimated DC date:not sure   Consultants: neurology, nephrology  Procedures: Hemodialysis  Antimicrobials:  Anti-infectives (From admission, onward)    Start     Dose/Rate Route Frequency Ordered Stop   09/26/21 1600  cefTRIAXone (ROCEPHIN) 2 g in sodium chloride 0.9 % 100 mL IVPB  Status:  Discontinued        2 g 200 mL/hr over 30 Minutes Intravenous Every 12 hours 09/26/21 1428 09/27/21 1356   09/24/21 1700  cefTRIAXone (ROCEPHIN) 2 g in sodium chloride 0.9 % 100 mL IVPB  Status:  Discontinued        2 g 200 mL/hr over 30 Minutes Intravenous Every 12 hours 09/24/21 0816 09/26/21 1428   09/23/21 1800  meropenem (MERREM) 1 g in sodium chloride 0.9 % 100 mL IVPB  Status:  Discontinued        1 g 200 mL/hr over 30 Minutes Intravenous Every 24 hours 09/22/21 1016 09/24/21 0816   09/23/21 1200  vancomycin (VANCOREADY) IVPB 500 mg/100 mL  Status:  Discontinued        500 mg 100 mL/hr over 60 Minutes Intravenous Every T-Th-Sa (Hemodialysis) 09/22/21 1016 09/25/21 0837   09/22/21 1115  meropenem (MERREM) 1 g in sodium chloride 0.9 % 100 mL IVPB  Status:  Discontinued        1 g 200 mL/hr over 30 Minutes Intravenous Every 24 hours 09/22/21 1015 09/22/21 1016   09/22/21 1115  meropenem (MERREM) 1 g in sodium chloride 0.9 % 100 mL IVPB        1 g 200 mL/hr over 30 Minutes Intravenous  Once 09/22/21 1016  09/22/21 1230   09/22/21 1115  vancomycin (VANCOREADY) IVPB 1250 mg/250 mL  Status:  Discontinued        1,250 mg 166.7 mL/hr over 90 Minutes Intravenous  Once 09/22/21 1016 09/22/21 1029   09/22/21 1115  vancomycin (VANCOCIN) IVPB 1000 mg/200 mL premix        1,000 mg 200 mL/hr over 60 Minutes Intravenous  Once 09/22/21 1029 09/22/21 1421   09/19/21 1200  vancomycin (VANCOREADY) IVPB 500 mg/100 mL  Status:  Discontinued        500 mg 100 mL/hr over 60 Minutes Intravenous Every T-Th-Sa (Hemodialysis) 09/17/21 1046 09/19/21 0759   09/17/21 0743  metroNIDAZOLE (FLAGYL) 500 MG/100ML IVPB       Note to Pharmacy: Nyoka Cowden D: cabinet override      09/17/21 0743 09/17/21 0827   09/16/21 1200  vancomycin (VANCOREADY) IVPB 750 mg/150 mL  Status:  Discontinued        750 mg 150 mL/hr over 60 Minutes Intravenous Every T-Th-Sa (Hemodialysis) 09/15/21 1748 09/17/21 1046   09/15/21 2100  ceFEPIme (MAXIPIME) 1 g in sodium chloride 0.9 % 100 mL IVPB  Status:  Discontinued        1 g 200  mL/hr over 30 Minutes Intravenous Every 24 hours 09/15/21 1957 09/19/21 0759   09/15/21 1800  ceFEPIme (MAXIPIME) 1 g in sodium chloride 0.9 % 100 mL IVPB  Status:  Discontinued        1 g 200 mL/hr over 30 Minutes Intravenous Every 24 hours 09/15/21 1742 09/15/21 1957   09/15/21 1745  metroNIDAZOLE (FLAGYL) tablet 500 mg  Status:  Discontinued        500 mg Oral Every 12 hours 09/15/21 1725 09/19/21 0759   09/15/21 1615  clindamycin (CLEOCIN) IVPB 600 mg        600 mg 100 mL/hr over 30 Minutes Intravenous  Once 09/15/21 1600 09/15/21 1638   09/15/21 1600  vancomycin (VANCOCIN) IVPB 1000 mg/200 mL premix        1,000 mg 200 mL/hr over 60 Minutes Intravenous  Once 09/15/21 1545 09/15/21 1755   09/15/21 1600  clindamycin (CLEOCIN) IVPB 600 mg  Status:  Discontinued        600 mg 100 mL/hr over 30 Minutes Intravenous  Once 09/15/21 1545 09/15/21 1600       Subjective:  Patient seen and examined at the  bedside this morning.  Same as yesterday.  Her feeding tube is clogged.  On room air.  Denies any complaints.  Continues to have severe ptosis  Objective: Vitals:   10/20/21 2014 10/21/21 0435 10/21/21 0442 10/21/21 0817  BP: 131/66 140/62  (!) 163/52  Pulse: 87 92  91  Resp:    14  Temp: 98.8 F (37.1 C)   98.2 F (36.8 C)  TempSrc: Oral   Oral  SpO2: 97% 100%    Weight:   62 kg   Height:        Intake/Output Summary (Last 24 hours) at 10/21/2021 1119 Last data filed at 10/20/2021 1144 Gross per 24 hour  Intake --  Output 830 ml  Net -830 ml   Filed Weights   10/20/21 0845 10/20/21 1144 10/21/21 0442  Weight: 62.5 kg 61.7 kg 62 kg    Examination:   General exam: Very deconditioned, chronically looking HEENT: Close eyelids, feeding tube Respiratory system:  no wheezes or crackles  Cardiovascular system: S1 & S2 heard, RRR.  Gastrointestinal system: Abdomen is nondistended, soft and nontender. Central nervous system: Alert and oriented Extremities: No edema, no clubbing ,no cyanosis, AV fistula on the right forearm, central line on the left neck Skin: Pressure ulcer as above   Data Reviewed: I have personally reviewed following labs and imaging studies  CBC: Recent Labs  Lab 10/16/21 1407 10/18/21 0650 10/20/21 0345  WBC 12.5* 12.4* 11.9*  NEUTROABS  --   --  7.5  HGB 8.3* 9.3* 8.8*  HCT 28.3* 32.1* 30.6*  MCV 97.9 97.0 97.5  PLT 354 320 270   Basic Metabolic Panel: Recent Labs  Lab 10/16/21 1406 10/18/21 0650 10/19/21 0144 10/20/21 0345  NA 134* 139 137 138  K 4.0 3.7 3.5 3.8  CL 97* 96* 98 95*  CO2 26 32 31 30  GLUCOSE 145* 150* 207* 269*  BUN 42* 50* 33* 60*  CREATININE 5.77* 4.70* 3.76* 4.62*  CALCIUM 9.3 10.4* 9.4 10.2  MG  --  1.9  --   --   PHOS 6.4* 4.4  --   --      No results found for this or any previous visit (from the past 240 hour(s)).   Radiology Studies: DG CHEST PORT 1 VIEW  Result Date: 10/20/2021 CLINICAL DATA:  Shortness of breath.  Recent stroke.  On dialysis. EXAM: PORTABLE CHEST 1 VIEW COMPARISON:  10/15/2021 FINDINGS: A left-sided dialysis catheter terminates at the mid right atrium. Feeding tube extends beyond the inferior aspect of the film. Numerous leads and wires project over the chest. Midline trachea. Borderline cardiomegaly. No pleural effusion or pneumothorax. No congestive failure. Mild pulmonary interstitial prominence is slightly asymmetric, greater left than right. IMPRESSION: Development of mild pulmonary interstitial prominence. Although this could be within normal variation given AP portable technique, pulmonary venous congestion is suspected. No overt congestive failure. Electronically Signed   By: Abigail Miyamoto M.D.   On: 10/20/2021 14:40    Scheduled Meds:  aspirin  81 mg Per Tube Daily   atorvastatin  40 mg Per Tube Daily   Chlorhexidine Gluconate Cloth  6 each Topical Q0600   cinacalcet  90 mg Oral Q supper   clopidogrel  75 mg Per Tube Daily   darbepoetin (ARANESP) injection - DIALYSIS  200 mcg Intravenous Q Fri-HD   dorzolamide-timolol  1 drop Left Eye BID   feeding supplement (NEPRO CARB STEADY)  960 mL Per Tube Q24H   feeding supplement (PROSource TF)  45 mL Per Tube BID   heparin injection (subcutaneous)  5,000 Units Subcutaneous Q8H   insulin aspart  0-6 Units Subcutaneous Q4H   insulin glargine-yfgn  5 Units Subcutaneous BID   lidocaine-prilocaine  1 application. Topical UD   mouth rinse  15 mL Mouth Rinse BID   multivitamin  1 tablet Per Tube QHS   nutrition supplement (JUVEN)  1 packet Per Tube BID BM   nystatin  5 mL Oral QID   pantoprazole sodium  40 mg Per Tube Daily   senna-docusate  1 tablet Per Tube BID   sevelamer carbonate  1.6 g Per Tube TID WC   thiamine  100 mg Per Tube Daily   Continuous Infusions:  dextrose 5% lactated ringers 30 mL/hr at 10/21/21 1009   lactated ringers Stopped (10/12/21 2239)     LOS: 36 days   Shelly Coss, MD [facility] P3/06/2022, 11:19 AM

## 2021-10-21 NOTE — Progress Notes (Signed)
?   10/21/21 1553  ?Provider Notification  ?Provider Name/Title Dr. Tawanna Solo  ?Date Provider Notified 10/21/21  ?Time Provider Notified 1553  ?Notification Type Page  ?Notification Reason Other (Comment) ?(NGT remains clogged. RN, Architectural technologist all attempted unsuccessfully to unclog tube. MD made aware that patient remains off of tube feeds and is not recieving any meds intended to be given via NGT.)  ?Provider response Other (Comment) ?(awaiting response.)  ? ? ?

## 2021-10-21 NOTE — Progress Notes (Signed)
Per the HD department, patient has completed 5 rounds of plasma exchange.  She does not need any more plasma exchange.  She needs outpatient follow-up and rehab from a neurological perspective now. ?She still has other medical issues being worked on. ? ? ?-- ?Amie Portland, MD ?Neurologist ?[facility] ?Pager: [phone] ? ?

## 2021-10-21 NOTE — Progress Notes (Signed)
?[name] ?Progress Note  ? ?Subjective:    ? ?Objective ?Vitals:  ? 10/20/21 2014 10/21/21 0435 10/21/21 0442 10/21/21 0817  ?BP: 131/66 140/62  (!) 163/52  ?Pulse: 87 92  91  ?Resp:    14  ?Temp: 98.8 ?F (37.1 ?C)   98.2 ?F (36.8 ?C)  ?TempSrc: Oral   Oral  ?SpO2: 97% 100%    ?Weight:   62 kg   ?Height:      ? ?Physical Exam ?General: Chronically ill appearing female in NAD ?HEENT: Facial edema somewhat improved.  ?Heart: S1,S2 no M/R/G. SR 90s.  ?Lungs: CTAB ?Abdomen: obese. Cortrak FT in place.  ?Extremities: R AKA staples intact. No LLE edema ?Dialysis Access: R AVF + T/B upper arm appears edematous, can see ulcerated area upper portion of AVF with thinning area. Temp cath intact LIJ.  ? ? ? ?Additional Objective ?Labs: ?Basic Metabolic Panel: ?Recent Labs  ?Lab 10/16/21 ?1406 10/18/21 ?0650 10/19/21 ?0144 10/20/21 ?0345  ?NA 134* 139 137 138  ?K 4.0 3.7 3.5 3.8  ?CL 97* 96* 98 95*  ?CO2 26 32 31 30  ?GLUCOSE 145* 150* 207* 269*  ?BUN 42* 50* 33* 60*  ?CREATININE 5.77* 4.70* 3.76* 4.62*  ?CALCIUM 9.3 10.4* 9.4 10.2  ?PHOS 6.4* 4.4  --   --   ? ?Liver Function Tests: ?Recent Labs  ?Lab 10/16/21 ?1406 10/18/21 ?0650  ?AST  --  20  ?ALT  --  15  ?ALKPHOS  --  59  ?BILITOT  --  0.4  ?PROT  --  6.1*  ?ALBUMIN 2.2* 2.6*  ? ?No results for input(s): LIPASE, AMYLASE in the last 168 hours. ?CBC: ?Recent Labs  ?Lab 10/16/21 ?1407 10/18/21 ?0650 10/20/21 ?0345  ?WBC 12.5* 12.4* 11.9*  ?NEUTROABS  --   --  7.5  ?HGB 8.3* 9.3* 8.8*  ?HCT 28.3* 32.1* 30.6*  ?MCV 97.9 97.0 97.5  ?PLT 354 320 268  ? ?Blood Culture ?   ?Component Value Date/Time  ? SDES BLOOD LEFT HAND 09/22/2021 1554  ? SPECREQUEST  09/22/2021 1554  ?  BOTTLES DRAWN AEROBIC AND ANAEROBIC Blood Culture results may not be optimal due to an inadequate volume of blood received in culture bottles  ? CULT  09/22/2021 1554  ?  NO GROWTH 5 DAYS ?Performed at [facility], [place] [address], [place], [place] [address] ?  ? REPTSTATUS 09/27/2021  FINAL 09/22/2021 1554  ? ? ?Cardiac Enzymes: ?No results for input(s): CKTOTAL, CKMB, CKMBINDEX, TROPONINI in the last 168 hours. ?CBG: ?Recent Labs  ?Lab 10/20/21 ?1558 10/20/21 ?2016 10/20/21 ?2356 10/21/21 ?0431 10/21/21 ?0825  ?GLUCAP 141* 104* 102* 106* 96  ? ?Iron Studies: No results for input(s): IRON, TIBC, TRANSFERRIN, FERRITIN in the last 72 hours. ?@[email or web address] @ ?Studies/Results: ?DG CHEST PORT 1 VIEW ? ?Result Date: 10/20/2021 ?CLINICAL DATA:  Shortness of breath.  Recent stroke.  On dialysis. EXAM: PORTABLE CHEST 1 VIEW COMPARISON:  10/15/2021 FINDINGS: A left-sided dialysis catheter terminates at the mid right atrium. Feeding tube extends beyond the inferior aspect of the film. Numerous leads and wires project over the chest. Midline trachea. Borderline cardiomegaly. No pleural effusion or pneumothorax. No congestive failure. Mild pulmonary interstitial prominence is slightly asymmetric, greater left than right. IMPRESSION: Development of mild pulmonary interstitial prominence. Although this could be within normal variation given AP portable technique, pulmonary venous congestion is suspected. No overt congestive failure. Electronically Signed   By: Abigail Miyamoto M.D.   On: 10/20/2021 14:40   ?Medications: ?  dextrose 5% lactated ringers 30 mL/hr at 10/21/21 1009  ? lactated ringers Stopped (10/12/21 2239)  ? ? aspirin  81 mg Per Tube Daily  ? atorvastatin  40 mg Per Tube Daily  ? Chlorhexidine Gluconate Cloth  6 each Topical Q0600  ? cinacalcet  90 mg Oral Q supper  ? clopidogrel  75 mg Per Tube Daily  ? darbepoetin (ARANESP) injection - DIALYSIS  200 mcg Intravenous Q Fri-HD  ? dorzolamide-timolol  1 drop Left Eye BID  ? feeding supplement (NEPRO CARB STEADY)  960 mL Per Tube Q24H  ? feeding supplement (PROSource TF)  45 mL Per Tube BID  ? heparin injection (subcutaneous)  5,000 Units Subcutaneous Q8H  ? insulin aspart  0-6 Units Subcutaneous Q4H  ? insulin glargine-yfgn  5 Units Subcutaneous BID  ?  lidocaine-prilocaine  1 application. Topical UD  ? mouth rinse  15 mL Mouth Rinse BID  ? multivitamin  1 tablet Per Tube QHS  ? nutrition supplement (JUVEN)  1 packet Per Tube BID BM  ? nystatin  5 mL Oral QID  ? pantoprazole sodium  40 mg Per Tube Daily  ? senna-docusate  1 tablet Per Tube BID  ? sevelamer carbonate  1.6 g Per Tube TID WC  ? thiamine  100 mg Per Tube Daily  ? ? ? ?Dialysis Orders: ?[facility] ?3h 62min   400/ 600   59.5kg   2/2 bath  UFP 2   RU AV fistula   ?Hep 4000 units IV TIW ?Mircera 200 mcg IV every 2 weeks - last 1/26 ?Ancef 2g 3 times weekly HD ?Covid neg 2/3, hep B SAg neg 2/4 ?  ?Assessment/Plan: ?Embolic stroke with AMS -neuro following, acute embolic CVA / Repeat MRI with no acute findings.  With PFO on TTE. TEE showed small PFO and no clot. Blood Cx 2/4 negative. S/p course of IV abx.  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 Plan for PLEX 3/11 (6th treatment).B/L ptosis - b/l vision loss & loss of eye motility. Plan for LP after 5 days off of ASA and Plavix. Getting PLEX QOD. 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 Please see #[id number] for alternative access. 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 Trula Slade [facility] following.  To go to CIR at some point.  ?ESRD - Usually TTS schedule - Currently off schedule. Having HD on MWF, PLEX on T,TH,S. Next HD 10/23/2021.  ?HTN/volume - noted episodes of hypotension during HD,  improved with fluid /midodrine with HD. Below prior EDW, will need to be lowered on d/c. Avoid hypotension. Keep SPB >120.  ?Sepsis/ shock - resolved  ?Anemia of CKD- last Hgb stable 8.8. Continue Aranesp 230mcg q Friday. Last dose 10/20/2021. Transfuse as needed.   ?MBD: CorrCa high, Phos ok. Continue home sensipar/binders, hold VDRA for now.   ?Severe protein calorie malnutrition: NG tube in place with  Nepro TF's + supplements. ?DMT2 - per PMD  ?Access: LUE AVF with some eschar- sticking away from now, monitor closely ?Facial edema- AM of 10/15/21--> ? Delayed reaction to PLEX with FFP vs SVC- Mar reviewed, no new meds. Received Solumedrol/ benadryl/ famotidine, R EJ catheter removed.  D/w hospitalist.  Since pt has had her AKA and no bleeding > 48 hrs out, OK to convert PLEX to albumin replacement fluid. S/p L  IJ trialysis HD cath in IR 3/6 by Dr. Vernard Gambles ?Ulcerated area AVF with thinning skin: Asked [facility] to see pt 10/20/2021. Sticking away from area ?Dispo - likely will need SNF placement/versus CIR  ? ?Johnathan Tortorelli H. Byanka Landrus NP-C ?10/21/2021, 11:14 AM  ?[facility] ?[phone] ? ? ?  ? ?

## 2021-10-22 DIAGNOSIS — I6340 Cerebral infarction due to embolism of unspecified cerebral artery: Secondary | ICD-10-CM | POA: Diagnosis not present

## 2021-10-22 LAB — THERAPEUTIC PLASMA EXCHANGE (BLOOD BANK)
Plasma Exchange: 2300
Plasma volume needed: 2300
Unit division: 0
Unit division: 0
Unit division: 0

## 2021-10-22 LAB — GLUCOSE, CAPILLARY
Glucose-Capillary: 83 mg/dL (ref 70–99)
Glucose-Capillary: 79 mg/dL (ref 70–99)
Glucose-Capillary: 96 mg/dL (ref 70–99)
Glucose-Capillary: 78 mg/dL (ref 70–99)
Glucose-Capillary: 114 mg/dL — ABNORMAL HIGH (ref 70–99)
Glucose-Capillary: 110 mg/dL — ABNORMAL HIGH (ref 70–99)

## 2021-10-22 MED ORDER — SENNOSIDES 8.8 MG/5ML PO SYRP
5.0000 mL | ORAL_SOLUTION | Freq: Two times a day (BID) | ORAL | Status: DC
  Administered 2021-10-22 – 2021-10-25 (×3): 5 mL
  Filled 2021-10-22 (×8): qty 5

## 2021-10-22 MED ORDER — VANCOMYCIN HCL IN DEXTROSE 1-5 GM/200ML-% IV SOLN
1000.0000 mg | INTRAVENOUS | Status: AC
Start: 2021-10-23 — End: 2021-10-23
  Administered 2021-10-23: 1000 mg via INTRAVENOUS
  Filled 2021-10-22: qty 200

## 2021-10-22 MED ORDER — PANTOPRAZOLE SODIUM 40 MG IV SOLR
40.0000 mg | INTRAVENOUS | Status: DC
  Administered 2021-10-22 – 2021-10-25 (×3): 40 mg via INTRAVENOUS
  Filled 2021-10-22 (×3): qty 10

## 2021-10-22 NOTE — Progress Notes (Signed)
PROGRESS NOTE  Jalan Bodi  GNF:[id number] DOB: 01/09/1981 DOA: 09/15/2021 PCP: [facility], [facility]   Brief Narrative:  [name] is a 41 y.o. female with medical history significant for poorly controlled DMT2, ESRD on TTS HD, HTN with poor compliance with medical treatment and HD was sent after completing dialysis session due to staff concern for altered mental status.  She has a recent history of TMA of the right foot about 2 months ago, grew MSSA and was on Ancef with dialysis.  Vascular surgery as well as nephrology consulted.  Hospital course complicated by an episode of lethargy, minimal responsiveness on 2/10 requiring rapid response.  An MRI of the brain showed scattered punctate acute infarcts bilaterally.  Neurology consulted.  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  Neurology recommends Plex, status post HD catheter placed.  Received 5 course of Plex 3/1, 3/3, 3/4 ,3/7,3/9.  Hospital course remarkable for persistent dysphagia needing tube feeding by core track, plan for PEG tube placement.   Assessment & Plan:  Principal Problem:   Cerebral embolism with cerebral infarction Active Problems:   GBS (Guillain-Barre syndrome) (HCC)   Anemia of chronic disease   Hyperglycemia due to type 2 diabetes mellitus (HCC)   ESRD on hemodialysis (HCC)   Acute encephalopathy   Osteomyelitis (HCC)   Pressure injury of skin   PFO (patent foramen ovale)   Malnutrition of moderate degree   Ptosis of both eyelids   Hypotension   Assessment and Plan: * Cerebral embolism with cerebral infarction Embolic stroke, with positive bubble study -neurology consulted and following.  An MRI of the brain 2/10 showed scattered punctate acute infarcts throughout both cerebral hemispheres which appeared to be embolic. A transesophageal echo done on 2/20 showed small PFO with no thrombus.  Completed stroke work-up.  Currently she is on  statin, aspirin and Plavix but this will be held for upcoming PEG procedure. Neurology recommended 30-day cardiac event monitoring as an outpatient  GBS (Guillain-Barre syndrome) [facility]) Hospital course remarkable for bilateral ptosis, facial diplegia, ophthalmoplegia, hyperreflexia.  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  MRI of the brain did not show any intracranial mass or abnormal enhancement to explain patient's ptosis, ophthalmoplegia.  MRI of the orbits symmetric extraocular muscles, no abnormal enhancement of the optic nerve sheath.  Neurology recommended Plex. Per neurology, had catheter placed 3/1, started Plex.  Done on 3/1, 3/3, 3/4, 3/7 ,3/9 , neurology were following. Session 5/5.I think we can  discontinue the central line. Neurology recommends EMG/nerve function test as an outpatient, outpatient neurology follow-up.  Hypotension Currently blood pressure stable.  Malnutrition of moderate degree -has a core track in place.  Encourage p.o. intake. Dietitian following. speech therapy following her,continues to recommend NPO.She has agreed for PEG  Pressure injury of skin Pressure Injury 09/24/21 Sacrum Right;Left;Medial Stage 2 -  Partial thickness loss of dermis presenting as a shallow open injury with a red, pink wound bed without slough. (Active)  09/24/21 1600  Location: Sacrum  Location Orientation: Right;Left;Medial  Staging: Stage 2 -  Partial thickness loss of dermis presenting as a shallow open injury with a red, pink wound bed without slough.  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  Wound looks  good  Acute encephalopathy Encephalopathy has improved.  Currently alert and oriented  ESRD on hemodialysis [facility]) Nephrology following for dialysis.She  has an AV fistula on the right upper extremity.  Vascular surgery planning for revision of fistula  Hyperglycemia due to type 2 diabetes mellitus ([place]) continue glargine and sliding scale.  .  Monitor blood sugars. continue current regimen  Anemia of chronic disease Secondary to chronic kidney disease.  Currently hemoglobin stable.  Monitor         Nutrition Problem: Moderate Malnutrition Etiology: acute illness (osteomyelitis of R foot)    DVT prophylaxis:heparin injection 5,000 Units Start: 10/09/21 1400 SCD's Start: 09/17/21 1002     Code Status: Full Code  Family Communication: Called husband multiple times past few days,calls not received  Patient status:Inpatient  Patient is from :Home  Anticipated discharge to:CIR  Estimated DC date:not sure   Consultants: neurology, nephrology  Procedures: Hemodialysis  Antimicrobials:  Anti-infectives (From admission, onward)    Start     Dose/Rate Route Frequency Ordered Stop   10/23/21 0600  vancomycin (VANCOCIN) IVPB 1000 mg/200 mL premix        1,000 mg 200 mL/hr over 60 Minutes Intravenous To Short Stay 10/22/21 0654 10/24/21 0600   09/26/21 1600  cefTRIAXone (ROCEPHIN) 2 g in sodium chloride 0.9 % 100 mL IVPB  Status:  Discontinued        2 g 200 mL/hr over 30 Minutes Intravenous Every 12 hours 09/26/21 1428 09/27/21 1356   09/24/21 1700  cefTRIAXone (ROCEPHIN) 2 g in sodium chloride 0.9 % 100 mL IVPB  Status:  Discontinued        2 g 200 mL/hr over 30 Minutes Intravenous Every 12 hours 09/24/21 0816 09/26/21 1428   09/23/21 1800  meropenem (MERREM) 1 g in sodium chloride 0.9 % 100 mL IVPB  Status:  Discontinued        1 g 200 mL/hr over 30 Minutes Intravenous Every 24 hours 09/22/21 1016 09/24/21 0816   09/23/21 1200  vancomycin (VANCOREADY) IVPB 500 mg/100 mL  Status:  Discontinued        500 mg 100 mL/hr over 60 Minutes Intravenous Every T-Th-Sa (Hemodialysis) 09/22/21 1016 09/25/21 0837   09/22/21 1115   meropenem (MERREM) 1 g in sodium chloride 0.9 % 100 mL IVPB  Status:  Discontinued        1 g 200 mL/hr over 30 Minutes Intravenous Every 24 hours 09/22/21 1015 09/22/21 1016   09/22/21 1115  meropenem (MERREM) 1 g in sodium chloride 0.9 % 100 mL IVPB        1 g 200 mL/hr over 30 Minutes Intravenous  Once 09/22/21 1016 09/22/21 1230   09/22/21 1115  vancomycin (VANCOREADY) IVPB 1250 mg/250 mL  Status:  Discontinued        1,250 mg 166.7 mL/hr over 90 Minutes Intravenous  Once 09/22/21 1016 09/22/21 1029   09/22/21 1115  vancomycin (VANCOCIN) IVPB 1000 mg/200 mL premix        1,000 mg 200 mL/hr over 60 Minutes Intravenous  Once 09/22/21 1029 09/22/21 1421   09/19/21 1200  vancomycin (VANCOREADY) IVPB 500 mg/100 mL  Status:  Discontinued        500 mg 100 mL/hr over 60 Minutes Intravenous Every T-Th-Sa (Hemodialysis) 09/17/21 1046 09/19/21 0759   09/17/21 0743  metroNIDAZOLE (FLAGYL) 500 MG/100ML IVPB       Note to Pharmacy: Nyoka Cowden D: cabinet override      09/17/21 0743 09/17/21 0827  09/16/21 1200  vancomycin (VANCOREADY) IVPB 750 mg/150 mL  Status:  Discontinued        750 mg 150 mL/hr over 60 Minutes Intravenous Every T-Th-Sa (Hemodialysis) 09/15/21 1748 09/17/21 1046   09/15/21 2100  ceFEPIme (MAXIPIME) 1 g in sodium chloride 0.9 % 100 mL IVPB  Status:  Discontinued        1 g 200 mL/hr over 30 Minutes Intravenous Every 24 hours 09/15/21 1957 09/19/21 0759   09/15/21 1800  ceFEPIme (MAXIPIME) 1 g in sodium chloride 0.9 % 100 mL IVPB  Status:  Discontinued        1 g 200 mL/hr over 30 Minutes Intravenous Every 24 hours 09/15/21 1742 09/15/21 1957   09/15/21 1745  metroNIDAZOLE (FLAGYL) tablet 500 mg  Status:  Discontinued        500 mg Oral Every 12 hours 09/15/21 1725 09/19/21 0759   09/15/21 1615  clindamycin (CLEOCIN) IVPB 600 mg        600 mg 100 mL/hr over 30 Minutes Intravenous  Once 09/15/21 1600 09/15/21 1638   09/15/21 1600  vancomycin (VANCOCIN) IVPB 1000 mg/200  mL premix        1,000 mg 200 mL/hr over 60 Minutes Intravenous  Once 09/15/21 1545 09/15/21 1755   09/15/21 1600  clindamycin (CLEOCIN) IVPB 600 mg  Status:  Discontinued        600 mg 100 mL/hr over 30 Minutes Intravenous  Once 09/15/21 1545 09/15/21 1600       Subjective:  Patient seen and examined at the bedside this morning.  Hemodynamically stable.  Her feeding tube has been clogged and we are seeking for new feeding tube placement.  She has agreed for PEG.  Denies any new complaints  Objective: Vitals:   10/21/21 1840 10/21/21 2000 10/22/21 0454 10/22/21 0743  BP: (!) 131/58 (!) 108/40 (!) 91/38 (!) 139/49  Pulse:   85 86  Resp: 14 15 16 15   Temp: 98.7 F (37.1 C) 98.3 F (36.8 C) 98.5 F (36.9 C) 98.4 F (36.9 C)  TempSrc: Oral Oral Oral Oral  SpO2: 100% 100% 97%   Weight:      Height:        Intake/Output Summary (Last 24 hours) at 10/22/2021 1112 Last data filed at 10/22/2021 0300 Gross per 24 hour  Intake 2349.64 ml  Output --  Net 2349.64 ml   Filed Weights   10/20/21 0845 10/20/21 1144 10/21/21 0442  Weight: 62.5 kg 61.7 kg 62 kg    Examination:   General exam: Very deconditioned, chronically looking HEENT: Close eyelids, feeding tube Respiratory system:  no wheezes or crackles  Cardiovascular system: S1 & S2 heard, RRR.  Gastrointestinal system: Abdomen is nondistended, soft and nontender. Central nervous system: Alert and oriented Extremities: No edema, no clubbing ,no cyanosis, AV fistula on the right forearm, central line on the left neck Skin: Pressure ulcer as above   Data Reviewed: I have personally reviewed following labs and imaging studies  CBC: Recent Labs  Lab 10/16/21 1407 10/18/21 0650 10/20/21 0345  WBC 12.5* 12.4* 11.9*  NEUTROABS  --   --  7.5  HGB 8.3* 9.3* 8.8*  HCT 28.3* 32.1* 30.6*  MCV 97.9 97.0 97.5  PLT 354 320 161   Basic Metabolic Panel: Recent Labs  Lab 10/16/21 1406 10/18/21 0650 10/19/21 0144  10/20/21 0345 10/21/21 1259  NA 134* 139 137 138 136  K 4.0 3.7 3.5 3.8 3.6  CL 97* 96* 98 95* 98  CO2 26 32 31 30 26   GLUCOSE 145* 150* 207* 269* 83  BUN 42* 50* 33* 60* 46*  CREATININE 5.77* 4.70* 3.76* 4.62* 4.42*  CALCIUM 9.3 10.4* 9.4 10.2 10.2  MG  --  1.9  --   --   --   PHOS 6.4* 4.4  --   --   --      No results found for this or any previous visit (from the past 240 hour(s)).   Radiology Studies: DG CHEST PORT 1 VIEW  Result Date: 10/20/2021 CLINICAL DATA:  Shortness of breath.  Recent stroke.  On dialysis. EXAM: PORTABLE CHEST 1 VIEW COMPARISON:  10/15/2021 FINDINGS: A left-sided dialysis catheter terminates at the mid right atrium. Feeding tube extends beyond the inferior aspect of the film. Numerous leads and wires project over the chest. Midline trachea. Borderline cardiomegaly. No pleural effusion or pneumothorax. No congestive failure. Mild pulmonary interstitial prominence is slightly asymmetric, greater left than right. IMPRESSION: Development of mild pulmonary interstitial prominence. Although this could be within normal variation given AP portable technique, pulmonary venous congestion is suspected. No overt congestive failure. Electronically Signed   By: Abigail Miyamoto M.D.   On: 10/20/2021 14:40    Scheduled Meds:  atorvastatin  40 mg Per Tube Daily   Chlorhexidine Gluconate Cloth  6 each Topical Q0600   cinacalcet  90 mg Oral Q supper   darbepoetin (ARANESP) injection - DIALYSIS  200 mcg Intravenous Q Fri-HD   dorzolamide-timolol  1 drop Left Eye BID   feeding supplement (NEPRO CARB STEADY)  960 mL Per Tube Q24H   feeding supplement (PROSource TF)  45 mL Per Tube BID   heparin injection (subcutaneous)  5,000 Units Subcutaneous Q8H   insulin aspart  0-6 Units Subcutaneous Q4H   insulin glargine-yfgn  5 Units Subcutaneous BID   mouth rinse  15 mL Mouth Rinse BID   multivitamin  1 tablet Per Tube QHS   nutrition supplement (JUVEN)  1 packet Per Tube BID BM    nystatin  5 mL Oral QID   pantoprazole (PROTONIX) IV  40 mg Intravenous Q24H   senna-docusate  1 tablet Per Tube BID   sevelamer carbonate  1.6 g Per Tube TID WC   thiamine  100 mg Per Tube Daily   Continuous Infusions:  dextrose 5% lactated ringers 50 mL/hr at 10/22/21 1027   [START ON 10/23/2021] vancomycin       LOS: 37 days   Shelly Coss, MD [facility] P3/07/2022, 11:12 AM

## 2021-10-22 NOTE — Progress Notes (Signed)
Mobility Specialist: Progress Note ? ? 10/22/21 1659  ?Mobility  ?Activity  ?(Bed Exercises)  ?Level of Assistance Modified independent, requires aide device or extra time  ?RUE Weight Bearing WBAT  ?RLE Weight Bearing NWB  ?Activity Response Tolerated well  ?$Mobility charge 1 Mobility  ? ?Pt received in bed and agreeable to bed exercises. Pt performed range of UE and LE exercises in the bed, no c/o throughout. Call bell and phone at her side at the end of the session. Bed alarm is on.  ? ?Harrell Gave Athira Janowicz ?Mobility Specialist ?Mobility Specialist [place]: [phone] ?Mobility Specialist [place]: [phone] ? ?

## 2021-10-22 NOTE — Progress Notes (Signed)
?[facility] ?Progress Note  ? ?Subjective: Seen in room. Cooperative today, no C/Os. Next HD 10/23/2021. Will need to coordinate HD with VVS surgery schedule.   ? ?Objective ?Vitals:  ? 10/21/21 1840 10/21/21 2000 10/22/21 0454 10/22/21 0743  ?BP: (!) 131/58 (!) 108/40 (!) 91/38 (!) 139/49  ?Pulse:   85 86  ?Resp: 14 15 16 15   ?Temp: 98.7 ?F (37.1 ?C) 98.3 ?F (36.8 ?C) 98.5 ?F (36.9 ?C) 98.4 ?F (36.9 ?C)  ?TempSrc: Oral Oral Oral Oral  ?SpO2: 100% 100% 97%   ?Weight:      ?Height:      ? ?Physical Exam ?General: Chronically ill appearing female in NAD ?HEENT: Facial edema somewhat improved.  ?Heart: S1,S2 no M/R/G. SR 90s.  ?Lungs: CTAB ?Abdomen: obese. Cortrak FT in place.  ?Extremities: R AKA staples intact. No LLE edema ?Dialysis Access: R AVF + T/B upper arm edematous from shoulder to forearm, can see ulcerated area upper portion of AVF with thinning area. Temp cath intact [facility].  ? ? ? ?Additional Objective ?Labs: ?Basic Metabolic Panel: ?Recent Labs  ?Lab 10/16/21 ?1406 10/18/21 ?0650 10/19/21 ?0144 10/20/21 ?0345 10/21/21 ?1259  ?NA 134* 139 137 138 136  ?K 4.0 3.7 3.5 3.8 3.6  ?CL 97* 96* 98 95* 98  ?CO2 26 32 31 30 26   ?GLUCOSE 145* 150* 207* 269* 83  ?BUN 42* 50* 33* 60* 46*  ?CREATININE 5.77* 4.70* 3.76* 4.62* 4.42*  ?CALCIUM 9.3 10.4* 9.4 10.2 10.2  ?PHOS 6.4* 4.4  --   --   --   ? ?Liver Function Tests: ?Recent Labs  ?Lab 10/16/21 ?1406 10/18/21 ?0650  ?AST  --  20  ?ALT  --  15  ?ALKPHOS  --  59  ?BILITOT  --  0.4  ?PROT  --  6.1*  ?ALBUMIN 2.2* 2.6*  ? ?No results for input(s): LIPASE, AMYLASE in the last 168 hours. ?CBC: ?Recent Labs  ?Lab 10/16/21 ?1407 10/18/21 ?0650 10/20/21 ?0345  ?WBC 12.5* 12.4* 11.9*  ?NEUTROABS  --   --  7.5  ?HGB 8.3* 9.3* 8.8*  ?HCT 28.3* 32.1* 30.6*  ?MCV 97.9 97.0 97.5  ?PLT 354 320 268  ? ?Blood Culture ?   ?Component Value Date/Time  ? SDES BLOOD LEFT HAND 09/22/2021 1554  ? SPECREQUEST  09/22/2021 1554  ?  BOTTLES DRAWN AEROBIC AND ANAEROBIC Blood  Culture results may not be optimal due to an inadequate volume of blood received in culture bottles  ? CULT  09/22/2021 1554  ?  NO GROWTH 5 DAYS ?Performed at [facility], [place] [address], [place], [place] [address] ?  ? REPTSTATUS 09/27/2021 FINAL 09/22/2021 1554  ? ? ?Cardiac Enzymes: ?No results for input(s): CKTOTAL, CKMB, CKMBINDEX, TROPONINI in the last 168 hours. ?CBG: ?Recent Labs  ?Lab 10/21/21 ?2102 10/22/21 ?0101 10/22/21 ?0448 10/22/21 ?3810 10/22/21 ?1148  ?GLUCAP 78 83 79 96 78  ? ?Iron Studies: No results for input(s): IRON, TIBC, TRANSFERRIN, FERRITIN in the last 72 hours. ?@[email or web address] @ ?Studies/Results: ?DG CHEST PORT 1 VIEW ? ?Result Date: 10/20/2021 ?CLINICAL DATA:  Shortness of breath.  Recent stroke.  On dialysis. EXAM: PORTABLE CHEST 1 VIEW COMPARISON:  10/15/2021 FINDINGS: A left-sided dialysis catheter terminates at the mid right atrium. Feeding tube extends beyond the inferior aspect of the film. Numerous leads and wires project over the chest. Midline trachea. Borderline cardiomegaly. No pleural effusion or pneumothorax. No congestive failure. Mild pulmonary interstitial prominence is slightly asymmetric, greater left than right. IMPRESSION: Development  of mild pulmonary interstitial prominence. Although this could be within normal variation given AP portable technique, pulmonary venous congestion is suspected. No overt congestive failure. Electronically Signed   By: Abigail Miyamoto M.D.   On: 10/20/2021 14:40   ?Medications: ? dextrose 5% lactated ringers 50 mL/hr at 10/22/21 1027  ? [START ON 10/23/2021] vancomycin    ? ? atorvastatin  40 mg Per Tube Daily  ? Chlorhexidine Gluconate Cloth  6 each Topical Q0600  ? cinacalcet  90 mg Oral Q supper  ? darbepoetin (ARANESP) injection - DIALYSIS  200 mcg Intravenous Q Fri-HD  ? dorzolamide-timolol  1 drop Left Eye BID  ? feeding supplement (NEPRO CARB STEADY)  960 mL Per Tube Q24H  ? feeding supplement (PROSource TF)  45 mL Per Tube BID  ?  heparin injection (subcutaneous)  5,000 Units Subcutaneous Q8H  ? insulin aspart  0-6 Units Subcutaneous Q4H  ? insulin glargine-yfgn  5 Units Subcutaneous BID  ? mouth rinse  15 mL Mouth Rinse BID  ? multivitamin  1 tablet Per Tube QHS  ? nutrition supplement (JUVEN)  1 packet Per Tube BID BM  ? nystatin  5 mL Oral QID  ? pantoprazole (PROTONIX) IV  40 mg Intravenous Q24H  ? sennosides  5 mL Per Tube BID  ? sevelamer carbonate  1.6 g Per Tube TID WC  ? thiamine  100 mg Per Tube Daily  ? ? ? ?Dialysis Orders: ?[facility] ?3h 25min   400/ 600   59.5kg   2/2 bath  UFP 2   RU AV fistula   ?Hep 4000 units IV TIW ?Mircera 200 mcg IV every 2 weeks - last 1/26 ?Ancef 2g 3 times weekly HD ?Covid neg 2/3, hep B SAg neg 2/4 ?  ?Assessment/Plan: ?Embolic stroke with AMS -neuro following, acute embolic CVA / Repeat MRI with no acute findings.  With PFO on TTE. TEE showed small PFO and no clot. Blood Cx 2/4 negative. S/p course of IV abx.  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 Plan for PLEX 3/11 (6th treatment).B/L ptosis - b/l vision loss & loss of eye motility. Plan for LP after 5 days off of ASA and Plavix. Getting PLEX QOD. 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 Please see #12 for alternative access. 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 Trula Slade [facility] following.  To go to CIR at some point.  ?ESRD - Usually TTS schedule - Currently off schedule. Having HD on MWF, PLEX on T,TH,S. Next HD 10/23/2021.  ?HTN/volume - noted episodes of hypotension during HD,  improved with fluid /midodrine with HD. Below prior EDW, will need to be lowered on d/c. Avoid hypotension. Keep SPB >120.  ?Sepsis/ shock - resolved  ?Anemia of CKD- last Hgb stable 8.8. Continue Aranesp 269mcg q Friday. Last dose 10/20/2021. Transfuse as needed.   ?MBD: CorrCa high, Phos ok.  Continue home sensipar/binders, hold VDRA for now.   ?Severe protein calorie malnutrition: NG tube in place with Nepro TF's + supplements. ?DMT2 - per PMD  ?Access: LUE AVF with some eschar- sticking away from now, monitor closely ?Facial edema- AM of 10/15/21--> ? Delayed reaction to PLEX with FFP vs SVC- Mar reviewed, no new meds. Received Solumedrol/ benadryl/ famotidine, R EJ catheter removed.  D/w hospitalist.  Since pt has had her  AKA and no bleeding > 48 hrs out, OK to convert PLEX to albumin replacement fluid. S/p L IJ trialysis HD cath in IR 3/6 by Dr. Vernard Gambles. Improved today. Questionable SVC syndrome.  ?Ulcerated area AVF with thinning skin: Asked VVS to see pt 10/20/2021. Sticking away from area. No bleeding so far. Going for revision of AVF 10/23/2021.  ?Dispo - likely will need SNF placement/versus CIR  ? ?Qais Jowers H. Isabel Freese NP-C ?10/22/2021, 12:35 PM  ?[facility] ?[phone] ? ? ?  ? ?

## 2021-10-22 NOTE — Progress Notes (Signed)
SWOT Nurse Delrae Rend, troubleshoot and unclogged pt. Cortrack successfully. 2 RN verification for placement, 30 cc air injected, audible. Tube feeding resumed, will continue to monitor. ?

## 2021-10-23 ENCOUNTER — Inpatient Hospital Stay (HOSPITAL_COMMUNITY): Payer: Medicare Other | Admitting: Certified Registered"

## 2021-10-23 ENCOUNTER — Inpatient Hospital Stay (HOSPITAL_COMMUNITY): Payer: Medicare Other

## 2021-10-23 ENCOUNTER — Encounter (HOSPITAL_COMMUNITY): Payer: Self-pay | Admitting: Internal Medicine

## 2021-10-23 ENCOUNTER — Other Ambulatory Visit: Payer: Self-pay

## 2021-10-23 ENCOUNTER — Encounter (HOSPITAL_COMMUNITY)
Admission: EM | Disposition: A | Discharge status: Other Acute Inpatient Hospital | Payer: Self-pay | Source: Home / Self Care | Attending: Internal Medicine

## 2021-10-23 DIAGNOSIS — I6340 Cerebral infarction due to embolism of unspecified cerebral artery: Secondary | ICD-10-CM | POA: Diagnosis not present

## 2021-10-23 DIAGNOSIS — T82590A Other mechanical complication of surgically created arteriovenous fistula, initial encounter: Secondary | ICD-10-CM

## 2021-10-23 DIAGNOSIS — N186 End stage renal disease: Secondary | ICD-10-CM

## 2021-10-23 DIAGNOSIS — I120 Hypertensive chronic kidney disease with stage 5 chronic kidney disease or end stage renal disease: Secondary | ICD-10-CM

## 2021-10-23 DIAGNOSIS — N185 Chronic kidney disease, stage 5: Secondary | ICD-10-CM

## 2021-10-23 DIAGNOSIS — Z992 Dependence on renal dialysis: Secondary | ICD-10-CM

## 2021-10-23 DIAGNOSIS — E1122 Type 2 diabetes mellitus with diabetic chronic kidney disease: Secondary | ICD-10-CM

## 2021-10-23 LAB — CBC
HCT: 31.6 % — ABNORMAL LOW (ref 36.0–46.0)
Hemoglobin: 9.2 g/dL — ABNORMAL LOW (ref 12.0–15.0)
MCH: 27.7 pg (ref 26.0–34.0)
MCHC: 29.1 g/dL — ABNORMAL LOW (ref 30.0–36.0)
MCV: 95.2 fL (ref 80.0–100.0)
Platelets: 296 10*3/uL (ref 150–400)
RBC: 3.32 MIL/uL — ABNORMAL LOW (ref 3.87–5.11)
RDW: 18.6 % — ABNORMAL HIGH (ref 11.5–15.5)
WBC: 9.3 10*3/uL (ref 4.0–10.5)
nRBC: 0.3 % — ABNORMAL HIGH (ref 0.0–0.2)

## 2021-10-23 LAB — GLUCOSE, CAPILLARY
Glucose-Capillary: 144 mg/dL — ABNORMAL HIGH (ref 70–99)
Glucose-Capillary: 96 mg/dL (ref 70–99)
Glucose-Capillary: 89 mg/dL (ref 70–99)
Glucose-Capillary: 103 mg/dL — ABNORMAL HIGH (ref 70–99)
Glucose-Capillary: 152 mg/dL — ABNORMAL HIGH (ref 70–99)
Glucose-Capillary: 203 mg/dL — ABNORMAL HIGH (ref 70–99)
Glucose-Capillary: 163 mg/dL — ABNORMAL HIGH (ref 70–99)

## 2021-10-23 LAB — BASIC METABOLIC PANEL
Anion gap: 12 (ref 5–15)
BUN: 61 mg/dL — ABNORMAL HIGH (ref 6–20)
CO2: 29 mmol/L (ref 22–32)
Calcium: 10.4 mg/dL — ABNORMAL HIGH (ref 8.9–10.3)
Chloride: 95 mmol/L — ABNORMAL LOW (ref 98–111)
Creatinine, Ser: 5.76 mg/dL — ABNORMAL HIGH (ref 0.44–1.00)
GFR, Estimated: 9 mL/min — ABNORMAL LOW (ref >60)
Glucose, Bld: 106 mg/dL — ABNORMAL HIGH (ref 70–99)
Potassium: 3.8 mmol/L (ref 3.5–5.1)
Sodium: 136 mmol/L (ref 135–145)

## 2021-10-23 SURGERY — REVISON OF ARTERIOVENOUS FISTULA
Anesthesia: General | Site: Neck | Laterality: Right

## 2021-10-23 MED ORDER — LIDOCAINE 2% (20 MG/ML) 5 ML SYRINGE
INTRAMUSCULAR | Status: DC | PRN
  Administered 2021-10-23: 60 mg via INTRAVENOUS

## 2021-10-23 MED ORDER — ORAL CARE MOUTH RINSE: 15.0000 mL | Freq: Once | OROMUCOSAL | Status: AC

## 2021-10-23 MED ORDER — PHENYLEPHRINE 40 MCG/ML (10ML) SYRINGE FOR IV PUSH (FOR BLOOD PRESSURE SUPPORT)
PREFILLED_SYRINGE | INTRAVENOUS | Status: DC | PRN
  Administered 2021-10-23: 120 ug via INTRAVENOUS
  Administered 2021-10-23: 200 ug via INTRAVENOUS

## 2021-10-23 MED ORDER — FENTANYL CITRATE (PF) 100 MCG/2ML IJ SOLN: 25.0000 ug | INTRAMUSCULAR | Status: DC | PRN

## 2021-10-23 MED ORDER — ROCURONIUM BROMIDE 10 MG/ML (PF) SYRINGE
PREFILLED_SYRINGE | INTRAVENOUS | Status: AC
  Filled 2021-10-23: qty 10

## 2021-10-23 MED ORDER — CHLORHEXIDINE GLUCONATE 0.12 % MT SOLN
OROMUCOSAL | Status: AC
  Administered 2021-10-23: 15 mL via OROMUCOSAL
  Filled 2021-10-23: qty 15

## 2021-10-23 MED ORDER — SODIUM CHLORIDE 0.9 % IV SOLN: INTRAVENOUS | Status: DC

## 2021-10-23 MED ORDER — DEXAMETHASONE SODIUM PHOSPHATE 10 MG/ML IJ SOLN
INTRAMUSCULAR | Status: DC | PRN
  Administered 2021-10-23: 10 mg via INTRAVENOUS

## 2021-10-23 MED ORDER — PHENYLEPHRINE 40 MCG/ML (10ML) SYRINGE FOR IV PUSH (FOR BLOOD PRESSURE SUPPORT)
PREFILLED_SYRINGE | INTRAVENOUS | Status: AC
  Filled 2021-10-23: qty 10

## 2021-10-23 MED ORDER — HEPARIN SODIUM (PORCINE) 1000 UNIT/ML IJ SOLN
INTRAMUSCULAR | Status: AC
  Filled 2021-10-23: qty 10

## 2021-10-23 MED ORDER — PROPOFOL 10 MG/ML IV BOLUS
INTRAVENOUS | Status: DC | PRN
  Administered 2021-10-23: 100 mg via INTRAVENOUS

## 2021-10-23 MED ORDER — KETAMINE HCL 50 MG/5ML IJ SOSY
PREFILLED_SYRINGE | INTRAMUSCULAR | Status: AC
  Filled 2021-10-23: qty 5

## 2021-10-23 MED ORDER — MIDAZOLAM HCL 2 MG/2ML IJ SOLN
INTRAMUSCULAR | Status: DC | PRN
  Administered 2021-10-23: 2 mg via INTRAVENOUS

## 2021-10-23 MED ORDER — SUGAMMADEX SODIUM 200 MG/2ML IV SOLN
INTRAVENOUS | Status: DC | PRN
  Administered 2021-10-23: 200 mg via INTRAVENOUS

## 2021-10-23 MED ORDER — PROPOFOL 10 MG/ML IV BOLUS
INTRAVENOUS | Status: AC
  Filled 2021-10-23: qty 20

## 2021-10-23 MED ORDER — KETAMINE HCL 10 MG/ML IJ SOLN
INTRAMUSCULAR | Status: DC | PRN
  Administered 2021-10-23: 10 mg via INTRAVENOUS
  Administered 2021-10-23: 40 mg via INTRAVENOUS

## 2021-10-23 MED ORDER — HEPARIN SOD (PORK) LOCK FLUSH 100 UNIT/ML IV SOLN
INTRAVENOUS | Status: AC
  Filled 2021-10-23: qty 5

## 2021-10-23 MED ORDER — PHENYLEPHRINE HCL-NACL 20-0.9 MG/250ML-% IV SOLN
INTRAVENOUS | Status: DC | PRN
  Administered 2021-10-23: 50 ug/min via INTRAVENOUS

## 2021-10-23 MED ORDER — DEXMEDETOMIDINE (PRECEDEX) IN NS 20 MCG/5ML (4 MCG/ML) IV SYRINGE
PREFILLED_SYRINGE | INTRAVENOUS | Status: AC
  Filled 2021-10-23: qty 5

## 2021-10-23 MED ORDER — EPHEDRINE SULFATE-NACL 50-0.9 MG/10ML-% IV SOSY
PREFILLED_SYRINGE | INTRAVENOUS | Status: DC | PRN
Start: 2021-10-23 — End: 2021-10-23
  Administered 2021-10-23: 10 mg via INTRAVENOUS
  Administered 2021-10-23: 15 mg via INTRAVENOUS

## 2021-10-23 MED ORDER — HEPARIN SODIUM (PORCINE) 1000 UNIT/ML IJ SOLN
INTRAMUSCULAR | Status: DC | PRN
  Administered 2021-10-23: 4000 [IU] via INTRAVENOUS

## 2021-10-23 MED ORDER — FENTANYL CITRATE (PF) 250 MCG/5ML IJ SOLN
INTRAMUSCULAR | Status: DC | PRN
  Administered 2021-10-23 (×3): 50 ug via INTRAVENOUS
  Administered 2021-10-23: 100 ug via INTRAVENOUS

## 2021-10-23 MED ORDER — HEPARIN SODIUM (PORCINE) 1000 UNIT/ML IJ SOLN
INTRAMUSCULAR | Status: DC | PRN
  Administered 2021-10-23: 3000 [IU] via INTRAVENOUS

## 2021-10-23 MED ORDER — ONDANSETRON HCL 4 MG/2ML IJ SOLN
INTRAMUSCULAR | Status: DC | PRN
  Administered 2021-10-23: 4 mg via INTRAVENOUS

## 2021-10-23 MED ORDER — ROCURONIUM BROMIDE 10 MG/ML (PF) SYRINGE
PREFILLED_SYRINGE | INTRAVENOUS | Status: DC | PRN
  Administered 2021-10-23: 40 mg via INTRAVENOUS

## 2021-10-23 MED ORDER — MIDAZOLAM HCL 2 MG/2ML IJ SOLN
INTRAMUSCULAR | Status: AC
  Filled 2021-10-23: qty 2

## 2021-10-23 MED ORDER — HEPARIN 6000 UNIT IRRIGATION SOLUTION
Status: DC | PRN
  Administered 2021-10-23: 1

## 2021-10-23 MED ORDER — LIDOCAINE HCL (PF) 1 % IJ SOLN
INTRAMUSCULAR | Status: AC
  Filled 2021-10-23: qty 30

## 2021-10-23 MED ORDER — 0.9 % SODIUM CHLORIDE (POUR BTL) OPTIME
TOPICAL | Status: DC | PRN
Start: 2021-10-23 — End: 2021-10-23
  Administered 2021-10-23: 1000 mL

## 2021-10-23 MED ORDER — CHLORHEXIDINE GLUCONATE 0.12 % MT SOLN: 15.0000 mL | Freq: Once | OROMUCOSAL | Status: AC

## 2021-10-23 MED ORDER — FENTANYL CITRATE (PF) 250 MCG/5ML IJ SOLN
INTRAMUSCULAR | Status: AC
  Filled 2021-10-23: qty 5

## 2021-10-23 MED ORDER — HEPARIN 6000 UNIT IRRIGATION SOLUTION
Status: AC
  Filled 2021-10-23: qty 500

## 2021-10-23 SURGICAL SUPPLY — 52 items
ARMBAND PINK RESTRICT EXTREMIT (MISCELLANEOUS) ×3 IMPLANT
BAG COUNTER SPONGE SURGICOUNT (BAG) ×3 IMPLANT
BAG DECANTER FOR FLEXI CONT (MISCELLANEOUS) ×3 IMPLANT
BAG SPNG CNTER NS LX DISP (BAG) ×2
BIOPATCH RED 1 DISK 7.0 (GAUZE/BANDAGES/DRESSINGS) ×3 IMPLANT
BNDG ELASTIC 4X5.8 VLCR STR LF (GAUZE/BANDAGES/DRESSINGS) ×2 IMPLANT
CANISTER SUCT 3000ML PPV (MISCELLANEOUS) ×3 IMPLANT
CATH PALINDROME-P 23CM W/VT (CATHETERS) ×1 IMPLANT
CATH STRAIGHT 5FR 65CM (CATHETERS) IMPLANT
CLIP VESOCCLUDE MED 6/CT (CLIP) ×3 IMPLANT
CLIP VESOCCLUDE SM WIDE 6/CT (CLIP) ×3 IMPLANT
COVER PROBE W GEL 5X96 (DRAPES) ×3 IMPLANT
COVER SURGICAL LIGHT HANDLE (MISCELLANEOUS) ×3 IMPLANT
DRSG COVADERM 4X6 (GAUZE/BANDAGES/DRESSINGS) ×1 IMPLANT
DRSG XEROFORM 1X8 (GAUZE/BANDAGES/DRESSINGS) ×1 IMPLANT
ELECT REM PT RETURN 9FT ADLT (ELECTROSURGICAL) ×3
ELECTRODE REM PT RTRN 9FT ADLT (ELECTROSURGICAL) ×2 IMPLANT
GAUZE 4X4 16PLY ~~LOC~~+RFID DBL (SPONGE) ×3 IMPLANT
GAUZE SPONGE 4X4 12PLY STRL (GAUZE/BANDAGES/DRESSINGS) ×2 IMPLANT
GLOVE SRG 8 PF TXTR STRL LF DI (GLOVE) ×2 IMPLANT
GLOVE SURG ENC MOIS LTX SZ7.5 (GLOVE) ×3 IMPLANT
GLOVE SURG UNDER POLY LF SZ8 (GLOVE) ×3
GOWN STRL REUS W/ TWL LRG LVL3 (GOWN DISPOSABLE) ×4 IMPLANT
GOWN STRL REUS W/ TWL XL LVL3 (GOWN DISPOSABLE) ×4 IMPLANT
GOWN STRL REUS W/TWL LRG LVL3 (GOWN DISPOSABLE) ×6
GOWN STRL REUS W/TWL XL LVL3 (GOWN DISPOSABLE) ×6
KIT BASIN OR (CUSTOM PROCEDURE TRAY) ×3 IMPLANT
KIT TURNOVER KIT B (KITS) ×3 IMPLANT
NDL HYPO 25GX1X1/2 BEV (NEEDLE) ×2 IMPLANT
NEEDLE HYPO 25GX1X1/2 BEV (NEEDLE) ×3 IMPLANT
NS IRRIG 1000ML POUR BTL (IV SOLUTION) ×3 IMPLANT
PACK CV ACCESS (CUSTOM PROCEDURE TRAY) ×3 IMPLANT
PACK SURGICAL SETUP 50X90 (CUSTOM PROCEDURE TRAY) ×3 IMPLANT
PAD ARMBOARD 7.5X6 YLW CONV (MISCELLANEOUS) ×6 IMPLANT
SET MICROPUNCTURE 5F STIFF (MISCELLANEOUS) IMPLANT
SLING ARM FOAM STRAP LRG (SOFTGOODS) ×1 IMPLANT
SLING ARM IMMOBILIZER LRG (SOFTGOODS) ×1 IMPLANT
SUT ETHILON 3 0 PS 1 (SUTURE) ×3 IMPLANT
SUT MNCRL AB 4-0 PS2 18 (SUTURE) ×4 IMPLANT
SUT PROLENE 5 0 C 1 24 (SUTURE) ×5 IMPLANT
SUT PROLENE 6 0 BV (SUTURE) ×1 IMPLANT
SUT VIC AB 3-0 SH 27 (SUTURE) ×3
SUT VIC AB 3-0 SH 27X BRD (SUTURE) ×2 IMPLANT
SYR 10ML LL (SYRINGE) ×3 IMPLANT
SYR 20ML LL LF (SYRINGE) ×6 IMPLANT
SYR 5ML LL (SYRINGE) ×3 IMPLANT
SYR CONTROL 10ML LL (SYRINGE) ×3 IMPLANT
TOWEL GREEN STERILE (TOWEL DISPOSABLE) ×3 IMPLANT
TOWEL GREEN STERILE FF (TOWEL DISPOSABLE) ×3 IMPLANT
UNDERPAD 30X36 HEAVY ABSORB (UNDERPADS AND DIAPERS) ×3 IMPLANT
WATER STERILE IRR 1000ML POUR (IV SOLUTION) ×3 IMPLANT
WIRE AMPLATZ SS-J .035X180CM (WIRE) IMPLANT

## 2021-10-23 NOTE — Anesthesia Procedure Notes (Signed)
Procedure Name: Intubation ?Date/Time: 10/23/2021 10:16 AM ?Performed by: Lance Coon, CRNA ?Pre-anesthesia Checklist: Patient identified, Emergency Drugs available, Suction available, Patient being monitored and Timeout performed ?Patient Re-evaluated:Patient Re-evaluated prior to induction ?Oxygen Delivery Method: Circle system utilized ?Preoxygenation: Pre-oxygenation with 100% oxygen ?Induction Type: IV induction ?Ventilation: Mask ventilation without difficulty and Oral airway inserted - appropriate to patient size ?Laryngoscope Size: Glidescope and 3 ?Grade View: Grade II ?Tube type: Oral ?Tube size: 7.0 mm ?Number of attempts: 1 ?Airway Equipment and Method: Stylet ?Placement Confirmation: ETT inserted through vocal cords under direct vision, positive ETCO2 and breath sounds checked- equal and bilateral ?Secured at: 22 cm ?Tube secured with: Tape ?Dental Injury: Teeth and Oropharynx as per pre-operative assessment  ? ? ? ? ?

## 2021-10-23 NOTE — Plan of Care (Signed)
?  Problem: Education: ?Goal: Knowledge of General Education information will improve ?Description: Including pain rating scale, medication(s)/side effects and non-pharmacologic comfort measures ?Outcome: Progressing ?  ?Problem: Pain Managment: ?Goal: General experience of comfort will improve ?Outcome: Progressing ?  ?Problem: Safety: ?Goal: Ability to remain free from injury will improve ?Outcome: Progressing ?  ?Problem: Skin Integrity: ?Goal: Risk for impaired skin integrity will decrease ?Outcome: Progressing ?  ?Problem: Education: ?Goal: Required Educational Video(s) ?Outcome: Progressing ?  ?Problem: Clinical Measurements: ?Goal: Ability to maintain clinical measurements within normal limits will improve ?Outcome: Progressing ?Goal: Postoperative complications will be avoided or minimized ?Outcome: Progressing ?  ?Problem: Skin Integrity: ?Goal: Demonstration of wound healing without infection will improve ?Outcome: Progressing ?  ?

## 2021-10-23 NOTE — Progress Notes (Signed)
PROGRESS NOTE  [name]  INO:[id number] DOB: 06-24-1981 DOA: 09/15/2021 PCP: [facility], [facility]   Brief Narrative:  [name] is a 41 y.o. female with medical history significant for poorly controlled DMT2, ESRD on TTS HD, HTN with poor compliance with medical treatment and HD was sent after completing dialysis session due to staff concern for altered mental status.  She has a recent history of TMA of the right foot about 2 months ago, grew MSSA and was on Ancef with dialysis.  Vascular surgery as well as nephrology consulted.  Hospital course complicated by an episode of lethargy, minimal responsiveness on 2/10 requiring rapid response.  An MRI of the brain showed scattered punctate acute infarcts bilaterally.  Neurology consulted.  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  Neurology recommends Plex, status post HD catheter placed.  Received 5 course of Plex 3/1, 3/3, 3/4 ,3/7,3/9.  Hospital course remarkable for persistent dysphagia needing tube feeding by core track, plan for PEG tube placement.   Assessment & Plan:  Principal Problem:   Cerebral embolism with cerebral infarction Active Problems:   GBS (Guillain-Barre syndrome) (HCC)   Anemia of chronic disease   Hyperglycemia due to type 2 diabetes mellitus (HCC)   ESRD on hemodialysis (HCC)   Acute encephalopathy   Osteomyelitis (HCC)   Pressure injury of skin   PFO (patent foramen ovale)   Malnutrition of moderate degree   Ptosis of both eyelids   Hypotension   Assessment and Plan: * Cerebral embolism with cerebral infarction Embolic stroke, with positive bubble study -neurology consulted and following.  An MRI of the brain 2/10 showed scattered punctate acute infarcts throughout both cerebral hemispheres which appeared to be embolic. A transesophageal echo done on 2/20 showed small PFO with no thrombus.  Completed stroke work-up.  Currently she is on  statin, aspirin and Plavix but this will be held for upcoming PEG procedure. Neurology recommended 30-day cardiac event monitoring as an outpatient  GBS (Guillain-Barre syndrome) [facility]) Hospital course remarkable for bilateral ptosis, facial diplegia, ophthalmoplegia, hyperreflexia.  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  MRI of the brain did not show any intracranial mass or abnormal enhancement to explain patient's ptosis, ophthalmoplegia.  MRI of the orbits symmetric extraocular muscles, no abnormal enhancement of the optic nerve sheath.  Neurology recommended Plex. Per neurology, had catheter placed 3/1, started Plex.  Done on 3/1, 3/3, 3/4, 3/7 ,3/9 , neurology were following. Session 5/5.I think we can  discontinue the central line. Neurology recommends EMG/nerve function test as an outpatient, outpatient neurology follow-up.  Hypotension Currently blood pressure stable/soft  Ptosis of both eyelids Persistent problem  Malnutrition of moderate degree -has a core track in place. Dietitian following. speech therapy following her,continues to recommend NPO.She has agreed for PEG  Pressure injury of skin Pressure Injury 09/24/21 Sacrum Right;Left;Medial Stage 2 -  Partial thickness loss of dermis presenting as a shallow open injury with a red, pink wound bed without slough. (Active)  09/24/21 1600  Location: Sacrum  Location Orientation: Right;Left;Medial  Staging: Stage 2 -  Partial thickness loss of dermis presenting as a shallow open injury with a red, pink wound bed without slough.  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  Wound looks good  Acute encephalopathy Encephalopathy has improved.  Currently alert and oriented  ESRD on hemodialysis [facility]) Nephrology  following for dialysis.She has an AV fistula on the right upper extremity.  Vascular surgery planning for revision of fistula  Hyperglycemia due to type 2 diabetes mellitus ([place]) continue glargine and sliding scale.  .  Monitor blood sugars. continue current regimen  Anemia of chronic disease Secondary to chronic kidney disease.  Currently hemoglobin stable.  Monitor         Nutrition Problem: Moderate Malnutrition Etiology: acute illness (osteomyelitis of R foot)    DVT prophylaxis:heparin injection 5,000 Units Start: 10/09/21 1400 SCD's Start: 09/17/21 1002     Code Status: Full Code  Family Communication: Called husband multiple times past few days,calls not received  Patient status:Inpatient  Patient is from :Home  Anticipated discharge to:CIR  Estimated DC date:not sure,waiting for PEG   Consultants: neurology, nephrology  Procedures: Hemodialysis  Antimicrobials:  Anti-infectives (From admission, onward)    Start     Dose/Rate Route Frequency Ordered Stop   10/23/21 0600  [MAR Hold]  vancomycin (VANCOCIN) IVPB 1000 mg/200 mL premix        (MAR Hold since Mon 10/23/2021 at 0931.Hold Reason: Transfer to a Procedural area)   1,000 mg 200 mL/hr over 60 Minutes Intravenous To Short Stay 10/22/21 0654 10/23/21 1046   09/26/21 1600  cefTRIAXone (ROCEPHIN) 2 g in sodium chloride 0.9 % 100 mL IVPB  Status:  Discontinued        2 g 200 mL/hr over 30 Minutes Intravenous Every 12 hours 09/26/21 1428 09/27/21 1356   09/24/21 1700  cefTRIAXone (ROCEPHIN) 2 g in sodium chloride 0.9 % 100 mL IVPB  Status:  Discontinued        2 g 200 mL/hr over 30 Minutes Intravenous Every 12 hours 09/24/21 0816 09/26/21 1428   09/23/21 1800  meropenem (MERREM) 1 g in sodium chloride 0.9 % 100 mL IVPB  Status:  Discontinued        1 g 200 mL/hr over 30 Minutes Intravenous Every 24 hours 09/22/21 1016 09/24/21 0816   09/23/21 1200  vancomycin (VANCOREADY) IVPB 500 mg/100 mL  Status:   Discontinued        500 mg 100 mL/hr over 60 Minutes Intravenous Every T-Th-Sa (Hemodialysis) 09/22/21 1016 09/25/21 0837   09/22/21 1115  meropenem (MERREM) 1 g in sodium chloride 0.9 % 100 mL IVPB  Status:  Discontinued        1 g 200 mL/hr over 30 Minutes Intravenous Every 24 hours 09/22/21 1015 09/22/21 1016   09/22/21 1115  meropenem (MERREM) 1 g in sodium chloride 0.9 % 100 mL IVPB        1 g 200 mL/hr over 30 Minutes Intravenous  Once 09/22/21 1016 09/22/21 1230   09/22/21 1115  vancomycin (VANCOREADY) IVPB 1250 mg/250 mL  Status:  Discontinued        1,250 mg 166.7 mL/hr over 90 Minutes Intravenous  Once 09/22/21 1016 09/22/21 1029   09/22/21 1115  vancomycin (VANCOCIN) IVPB 1000 mg/200 mL premix        1,000 mg 200 mL/hr over 60 Minutes Intravenous  Once 09/22/21 1029 09/22/21 1421   09/19/21 1200  vancomycin (VANCOREADY) IVPB 500 mg/100 mL  Status:  Discontinued        500 mg 100 mL/hr over 60 Minutes Intravenous Every T-Th-Sa (Hemodialysis) 09/17/21 1046 09/19/21 0759   09/17/21 0743  metroNIDAZOLE (FLAGYL) 500 MG/100ML IVPB  Note to Pharmacy: Nyoka Cowden D: cabinet override      09/17/21 0743 09/17/21 0827   09/16/21 1200  vancomycin (VANCOREADY) IVPB 750 mg/150 mL  Status:  Discontinued        750 mg 150 mL/hr over 60 Minutes Intravenous Every T-Th-Sa (Hemodialysis) 09/15/21 1748 09/17/21 1046   09/15/21 2100  ceFEPIme (MAXIPIME) 1 g in sodium chloride 0.9 % 100 mL IVPB  Status:  Discontinued        1 g 200 mL/hr over 30 Minutes Intravenous Every 24 hours 09/15/21 1957 09/19/21 0759   09/15/21 1800  ceFEPIme (MAXIPIME) 1 g in sodium chloride 0.9 % 100 mL IVPB  Status:  Discontinued        1 g 200 mL/hr over 30 Minutes Intravenous Every 24 hours 09/15/21 1742 09/15/21 1957   09/15/21 1745  metroNIDAZOLE (FLAGYL) tablet 500 mg  Status:  Discontinued        500 mg Oral Every 12 hours 09/15/21 1725 09/19/21 0759   09/15/21 1615  clindamycin (CLEOCIN) IVPB 600 mg         600 mg 100 mL/hr over 30 Minutes Intravenous  Once 09/15/21 1600 09/15/21 1638   09/15/21 1600  vancomycin (VANCOCIN) IVPB 1000 mg/200 mL premix        1,000 mg 200 mL/hr over 60 Minutes Intravenous  Once 09/15/21 1545 09/15/21 1755   09/15/21 1600  clindamycin (CLEOCIN) IVPB 600 mg  Status:  Discontinued        600 mg 100 mL/hr over 30 Minutes Intravenous  Once 09/15/21 1545 09/15/21 1600       Subjective:  Patient seen and examined at bedside this morning.  No significant change from yesterday.  She continues to have bilateral ptosis, on feeding tube.  See has been planned for revision of AV fistula by vascular surgery.  She continues to agree with the PEG plan  Objective: Vitals:   10/22/21 0743 10/22/21 1515 10/23/21 0802 10/23/21 0950  BP: (!) 139/49 (!) 118/38 (!) 89/49 128/64  Pulse: 86 85 79 86  Resp: 15 13 17 14   Temp: 98.4 F (36.9 C) 98.3 F (36.8 C) 98.1 F (36.7 C)   TempSrc: Oral Oral Oral   SpO2:   100% 100%  Weight:      Height:        Intake/Output Summary (Last 24 hours) at 10/23/2021 1048 Last data filed at 10/23/2021 0330 Gross per 24 hour  Intake 4826 ml  Output --  Net 4826 ml   Filed Weights   10/20/21 0845 10/20/21 1144 10/21/21 0442  Weight: 62.5 kg 61.7 kg 62 kg    Examination:   General exam: Very deconditioned, chronically looking HEENT: Close eyelids, feeding tube Respiratory system:  no wheezes or crackles  Cardiovascular system: S1 & S2 heard, RRR.  Gastrointestinal system: Abdomen is nondistended, soft and nontender. Central nervous system: Alert and oriented Extremities: No edema, no clubbing ,no cyanosis, AV fistula on the right forearm, central line on the left neck Skin: Pressure ulcer as above   Data Reviewed: I have personally reviewed following labs and imaging studies  CBC: Recent Labs  Lab 10/16/21 1407 10/18/21 0650 10/20/21 0345 10/23/21 0747  WBC 12.5* 12.4* 11.9* 9.3  NEUTROABS  --   --  7.5  --   HGB  8.3* 9.3* 8.8* 9.2*  HCT 28.3* 32.1* 30.6* 31.6*  MCV 97.9 97.0 97.5 95.2  PLT 354 320 268 314   Basic Metabolic Panel: Recent Labs  Lab  10/16/21 1406 10/18/21 0650 10/19/21 0144 10/20/21 0345 10/21/21 1259 10/23/21 0747  NA 134* 139 137 138 136 136  K 4.0 3.7 3.5 3.8 3.6 3.8  CL 97* 96* 98 95* 98 95*  CO2 26 32 31 30 26 29   GLUCOSE 145* 150* 207* 269* 83 106*  BUN 42* 50* 33* 60* 46* 61*  CREATININE 5.77* 4.70* 3.76* 4.62* 4.42* 5.76*  CALCIUM 9.3 10.4* 9.4 10.2 10.2 10.4*  MG  --  1.9  --   --   --   --   PHOS 6.4* 4.4  --   --   --   --      No results found for this or any previous visit (from the past 240 hour(s)).   Radiology Studies: No results found.  Scheduled Meds:  [MAR Hold] atorvastatin  40 mg Per Tube Daily   [MAR Hold] Chlorhexidine Gluconate Cloth  6 each Topical Q0600   [MAR Hold] cinacalcet  90 mg Oral Q supper   [MAR Hold] darbepoetin (ARANESP) injection - DIALYSIS  200 mcg Intravenous Q Fri-HD   [MAR Hold] dorzolamide-timolol  1 drop Left Eye BID   [MAR Hold] feeding supplement (NEPRO CARB STEADY)  960 mL Per Tube Q24H   [MAR Hold] feeding supplement (PROSource TF)  45 mL Per Tube BID   [MAR Hold] heparin injection (subcutaneous)  5,000 Units Subcutaneous Q8H   [MAR Hold] insulin aspart  0-6 Units Subcutaneous Q4H   [MAR Hold] insulin glargine-yfgn  5 Units Subcutaneous BID   [MAR Hold] mouth rinse  15 mL Mouth Rinse BID   [MAR Hold] multivitamin  1 tablet Per Tube QHS   [MAR Hold] nutrition supplement (JUVEN)  1 packet Per Tube BID BM   [MAR Hold] nystatin  5 mL Oral QID   [MAR Hold] pantoprazole (PROTONIX) IV  40 mg Intravenous Q24H   [MAR Hold] sennosides  5 mL Per Tube BID   [MAR Hold] sevelamer carbonate  1.6 g Per Tube TID WC   [MAR Hold] thiamine  100 mg Per Tube Daily   Continuous Infusions:  sodium chloride 10 mL/hr at 10/23/21 0946     LOS: 38 days   Shelly Coss, MD [facility] P3/13/2023, 10:48 AM

## 2021-10-23 NOTE — Anesthesia Preprocedure Evaluation (Addendum)
Anesthesia Evaluation  ?Patient identified by MRN, date of birth, ID band ?Patient awake ? ?General Assessment Comment:Arousable, answers few questions ? ?Reviewed: ?Allergy & Precautions, NPO status , Patient's Chart, lab work & pertinent test results ? ?History of Anesthesia Complications ?Negative for: history of anesthetic complications ? ?Airway ?Mallampati: III ? ?TM Distance: >3 FB ?Neck ROM: Full ? ? ? Dental ? ?(+) Poor Dentition, Loose, Dental Advisory Given,  ?  ?Pulmonary ?shortness of breath,  ?  ? ?+ decreased breath sounds ? ? ? ? ? Cardiovascular ?hypertension, Pt. on medications ?(-) angina+ Peripheral Vascular Disease  ?(-) Past MI Normal cardiovascular exam ?Rhythm:Regular Rate:Normal ? ??1. Left ventricular ejection fraction, by estimation, is 70 to 75%. The  ?left ventricle has hyperdynamic function.  ??2. Right ventricular systolic function is normal. The right ventricular  ?size is normal.  ??3. No left atrial/left atrial appendage thrombus was detected.  ??4. The mitral valve is normal in structure. Mild mitral valve  ?regurgitation.  ??5. The aortic valve is tricuspid. Aortic valve regurgitation is not  ?visualized. Aortic valve sclerosis is present, with no evidence of aortic  ?valve stenosis.  ??6. Small PFO. Very small PFO as tested with injection of agitated saline  ?wit ha few bubbles seen in LA. Not clearly visible with color doppler .  ?There is a small patent foramen ovale.  ?  ?Neuro/Psych ?PSYCHIATRIC DISORDERS Anxiety  Neuromuscular disease CVA   ? GI/Hepatic ?negative GI ROS, Neg liver ROS,   ?Endo/Other  ?diabetes, Poorly Controlled, Insulin DependentLab Results ?     Component                Value               Date                 ?     HGBA1C                   10.4 (H)            08/11/2021           ? ? Renal/GU ?ESRF and DialysisRenal diseaseLab Results ?     Component                Value               Date                 ?     CREATININE                5.20 (H)            10/11/2021           ?Lab Results ?     Component                Value               Date                 ?     K                        4.8                 10/11/2021           ?  ? ?  ?Musculoskeletal ? ? Abdominal ?  ?Peds ? Hematology ? ?(+) Blood  dyscrasia, anemia , Lab Results ?     Component                Value               Date                 ?     WBC                      8.8                 10/10/2021           ?     HGB                      12.2                10/11/2021           ?     HCT                      36.0                10/11/2021           ?     MCV                      99.3                10/10/2021           ?     PLT                      310                 10/10/2021           ?   ?Anesthesia Other Findings ? ? Reproductive/Obstetrics ? ?  ? ? ? ? ? ? ? ? ? ? ? ? ? ?  ?  ? ? ? ? ? ? ? ?Anesthesia Physical ?Anesthesia Plan ? ?ASA: 3 ? ?Anesthesia Plan: General  ? ?Post-op Pain Management: Tylenol PO (pre-op)* and Ketamine IV*  ? ?Induction: Intravenous ? ?PONV Risk Score and Plan: 3 and Ondansetron, Dexamethasone and Treatment may vary due to age or medical condition ? ?Airway Management Planned: Oral ETT and Video Laryngoscope Planned ? ?Additional Equipment:  ? ?Intra-op Plan:  ? ?Post-operative Plan: Extubation in OR ? ?Informed Consent: I have reviewed the patients History and Physical, chart, labs and discussed the procedure including the risks, benefits and alternatives for the proposed anesthesia with the patient or authorized representative who has indicated his/her understanding and acceptance.  ? ? ? ?Dental advisory given ? ?Plan Discussed with: CRNA ? ?Anesthesia Plan Comments:   ? ? ? ? ? ?Anesthesia Quick Evaluation ? ?

## 2021-10-23 NOTE — Progress Notes (Signed)
Pt off of unit for dialysis. ?

## 2021-10-23 NOTE — Progress Notes (Signed)
SLP Cancellation Note ? ?Patient Details ?Name: [name] ?MRN: [id number] ?DOB: [birth year] ? ? ?Cancelled treatment:        Pt continues NPO status and is having a fistula placed/replaced (?) today. Briefly spoke with her re: odonophagia. She was having a coughing when SLP arrived. Pt states her throat continues to hurt. Will plan to see tomorrow to further assess.  ? ? ?Houston Siren ?10/23/2021, 10:04 AM ?

## 2021-10-23 NOTE — Progress Notes (Signed)
Inpatient Rehab Admissions Coordinator:  ? ?Pt. Not yet medically ready for CIR, to surgery today. Will follow for potential admit later this week.  ? ?Clemens Catholic, MS, CCC-SLP ?Rehab Admissions Coordinator  ?[phone] (celll) ?[phone] (office) ? ?

## 2021-10-23 NOTE — Progress Notes (Signed)
Subjective: No a.m. complaints, HD for today, coordinate with VVS revision of ulcerated right arm AV fistula. ? ?Objective ?Vital signs in last 24 hours: ?Vitals:  ? 10/22/21 0454 10/22/21 0743 10/22/21 1515 10/23/21 0802  ?BP: (!) 91/38 (!) 139/49 (!) 118/38 (!) 89/49  ?Pulse: 85 86 85 79  ?Resp: 16 15 13 17   ?Temp: 98.5 ?F (36.9 ?C) 98.4 ?F (36.9 ?C) 98.3 ?F (36.8 ?C) 98.1 ?F (36.7 ?C)  ?TempSrc: Oral Oral Oral Oral  ?SpO2: 97%   100%  ?Weight:      ?Height:      ? ?Weight change:  ? ?Physical Exam: ?General: Obese chronically ill female NAD, facial edema stable ?Heart: RRR no MRG ?Lungs: CTA nonlabored breathing ?Abdomen: Obese, NTND, NG tube feeding in place ?Extremities: No lower extremity edema, right AKA dressing dry clear ? dialysis Access: R  U Arm AVF addendum Mr. Shoulder forearm ulcerated upper portion of AV fistula some thinning, temp cath left IJ ? ? ?Dialysis Orders: ?[facility] (off schedule in hospital M WF) ?3h 42min   400/ 600   59.5kg   2/2 bath  UFP 2   RU AV fistula   ?Hep 4000 units IV TIW ?Mircera 200 mcg IV every 2 weeks - last 1/26 ?Ancef 2g 3 times weekly HD ?Covid neg 2/3, hep B SAg neg 2/4 ?  ? ?Problem/Plan ?Embolic stroke with AMS -neuro following, acute embolic CVA / Repeat MRI with no acute findings.  With PFO on TTE. TEE showed small PFO and no clot. Blood Cx 2/4 negative. S/p course of IV abx.  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  PLEX 3/11 (6th treatment).B/L ptosis - b/l vision loss & loss of eye motility. Plan for LP after 5 days off of ASA and Plavix. Getting PLEX QOD. 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 Please see #12 for alternative access. ?ESRD - OP TTS schedule - Currently off schedule. Having HD on MWF, PLEX on T,TH,S. Next HD today 10/23/2021.  ?Ulcerated area AVF with thinning skin: Asked VVS to see pt  10/20/2021. Sticking away from area. No bleeding so far. Going for revision of AVF today 10/23/2021.  ?Facial edema- AM of 10/15/21--> ? Delayed reaction to PLEX with FFP vs SVC- Mar reviewed, no new meds. Received Solumedrol/ benadryl/ famotidine, R EJ catheter removed.  D/w hospitalist.  Since pt has had her AKA and no bleeding > 48 hrs out, OK to convert PLEX to albumin replacement fluid. S/p L IJ trialysis HD cath in IR 3/6 by Dr. Vernard Gambles. Improved today. Questionable SVC syndrome. ?HTN/volume - noted episodes of hypotension during HD,  improved with fluid /midodrine with HD. Below prior EDW, will need to be lowered on d/c. Avoid hypotension. Keep SPB >120.  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 Trula Slade VVS following.  To go to CIR at some point.  ?Sepsis/ shock - resolved  ?Anemia of CKD- last Hgb stable 8.8. Continue Aranesp 226mcg q Friday. Last dose 10/20/2021. Transfuse as needed.   ?MBD: CorrCa high, Phos ok. Continue home sensipar/binders, hold VDRA for now.   ?Severe protein calorie malnutrition: NG tube in place with Nepro TF's + supplements. ?DMT2 - per PMD  ? ? ?Ernest Haber, PA-C ?[facility] ?Beeper [phone] ?10/23/2021,8:27 AM ? LOS: 38 days  ? ?Labs: ?Basic Metabolic  Panel: ?Recent Labs  ?Lab 10/16/21 ?1406 10/18/21 ?0650 10/19/21 ?0144 10/20/21 ?0345 10/21/21 ?1259 10/23/21 ?0747  ?NA 134* 139   < > 138 136 136  ?K 4.0 3.7   < > 3.8 3.6 3.8  ?CL 97* 96*   < > 95* 98 95*  ?CO2 26 32   < > 30 26 29   ?GLUCOSE 145* 150*   < > 269* 83 106*  ?BUN 42* 50*   < > 60* 46* 61*  ?CREATININE 5.77* 4.70*   < > 4.62* 4.42* 5.76*  ?CALCIUM 9.3 10.4*   < > 10.2 10.2 10.4*  ?PHOS 6.4* 4.4  --   --   --   --   ? < > = values in this interval not displayed.  ? ?Liver Function Tests: ?Recent Labs  ?Lab 10/16/21 ?1406 10/18/21 ?0650  ?AST  --  20  ?ALT  --  15  ?ALKPHOS  --  59  ?BILITOT  --  0.4  ?PROT  --  6.1*  ?ALBUMIN 2.2* 2.6*  ? ?No results for input(s): LIPASE, AMYLASE in  the last 168 hours. ?No results for input(s): AMMONIA in the last 168 hours. ?CBC: ?Recent Labs  ?Lab 10/16/21 ?1407 10/18/21 ?0650 10/20/21 ?0345 10/23/21 ?0747  ?WBC 12.5* 12.4* 11.9* 9.3  ?NEUTROABS  --   --  7.5  --   ?HGB 8.3* 9.3* 8.8* 9.2*  ?HCT 28.3* 32.1* 30.6* 31.6*  ?MCV 97.9 97.0 97.5 95.2  ?PLT 354 320 268 296  ? ?Cardiac Enzymes: ?No results for input(s): CKTOTAL, CKMB, CKMBINDEX, TROPONINI in the last 168 hours. ?CBG: ?Recent Labs  ?Lab 10/22/21 ?1148 10/22/21 ?1606 10/22/21 ?2041 10/23/21 ?7782 10/23/21 ?0804  ?GLUCAP 78 114* 110* 144* 96  ? ? ?Studies/Results: ?No results found. ?Medications: ? vancomycin    ? ? atorvastatin  40 mg Per Tube Daily  ? Chlorhexidine Gluconate Cloth  6 each Topical Q0600  ? cinacalcet  90 mg Oral Q supper  ? darbepoetin (ARANESP) injection - DIALYSIS  200 mcg Intravenous Q Fri-HD  ? dorzolamide-timolol  1 drop Left Eye BID  ? feeding supplement (NEPRO CARB STEADY)  960 mL Per Tube Q24H  ? feeding supplement (PROSource TF)  45 mL Per Tube BID  ? heparin injection (subcutaneous)  5,000 Units Subcutaneous Q8H  ? insulin aspart  0-6 Units Subcutaneous Q4H  ? insulin glargine-yfgn  5 Units Subcutaneous BID  ? mouth rinse  15 mL Mouth Rinse BID  ? multivitamin  1 tablet Per Tube QHS  ? nutrition supplement (JUVEN)  1 packet Per Tube BID BM  ? nystatin  5 mL Oral QID  ? pantoprazole (PROTONIX) IV  40 mg Intravenous Q24H  ? sennosides  5 mL Per Tube BID  ? sevelamer carbonate  1.6 g Per Tube TID WC  ? thiamine  100 mg Per Tube Daily  ? ? ? ? ?

## 2021-10-23 NOTE — Progress Notes (Signed)
PT Cancellation Note ? ?Patient Details ?Name: Doris Gruhn ?MRN: [id number] ?DOB: July 31, 1981 ? ? ?Cancelled Treatment:    Reason Eval/Treat Not Completed: Patient at procedure or test/unavailable. Pt in OR.  ? ? ?Shary Decamp [facility] ?10/23/2021, 10:20 AM ?Suanne Marker PT ?Acute Rehabilitation Services ?Pager [phone] ?Office [phone] ? ? ? ?

## 2021-10-23 NOTE — Assessment & Plan Note (Signed)
Persistent problem

## 2021-10-23 NOTE — Transfer of Care (Signed)
Immediate Anesthesia Transfer of Care Note ? ?Patient: [name] ? ?Procedure(s) Performed: REVISON OF ARTERIOVENOUS FISTULA  ARM AND PLICATION (Right: Arm Upper) ?INSERTION OF TUNNELED PALINDROME PRECISION DIALYSIS CATHETER (23cm) (Left: Neck) ? ?Patient Location: PACU ? ?Anesthesia Type:General ? ?Level of Consciousness: drowsy and patient cooperative ? ?Airway & Oxygen Therapy: Patient Spontanous Breathing ? ?Post-op Assessment: Report given to RN and Post -op Vital signs reviewed and stable ? ?Post vital signs: Reviewed and stable ? ?Last Vitals:  ?Vitals Value Taken Time  ?BP 109/47 10/23/21 1222  ?Temp    ?Pulse 90 10/23/21 1221  ?Resp 5 10/23/21 1222  ?SpO2 94 % 10/23/21 1221  ?Vitals shown include unvalidated device data. ? ?Last Pain:  ?Vitals:  ? 10/23/21 0950  ?TempSrc:   ?PainSc: 9   ?   ? ?Patients Stated Pain Goal: 0 (10/22/21 1810) ? ?Complications: No notable events documented. ?

## 2021-10-23 NOTE — Op Note (Signed)
Date: October 23, 2021 ? 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 ?Postoperative diagnosis: Same ? ?Procedure: ?1.  Placement of left internal jugular vein tunneled dialysis catheter (23 cm palindrome) ?2.  Revision of right arm AV fistula with aneurysm plication and excision of ulcerated skin ? ?Surgeon: Dr. Marty Heck, MD ? ?Assistant: Roxy Horseman, PA ? ?Indications: 41 year old female seen in dialysis on Friday in consultation for an ulcerated fistula over right brachiocephalic AV fistula.  She presents today for tunneled dialysis catheter placement and revision of her AV fistula after risk benefits discussed.  An assistant was needed for exposure and to expedite the case and for sewing the plication. ? ?Findings: The left IJ had a temporary catheter in place and this catheter was removed after placing a J-wire into the right atrium through the existing catheter under fluoroscopic guidance and then placing a tunneled 23 cm palindrome catheter in the left IJ.  The right arm AV fistula was then fully mobilized over the ulcerated segment including removing the ulcerated skin.  Ultimately the aneurysm was plicated with a 5-0 Prolene and the skin was closed over top with excellent thrill at completion. ? ?Anesthesia: General ? ?Details: Patient was taken to the operating room after informed consent was obtained.  Placed on the operative table in supine position.  After anesthesia induced the bilateral necks including the temporary catheter in the left IJ were prepped and draped as well as the right arm was prepped and draped in standard sterile fashion.  Timeout was performed.  Antibiotics were given.  Initially under fluoroscopic guidance placed a J-wire down the existing temporary left IJ dialysis catheter with the tip of the wire in the right atrium.  I then removed the catheter after cutting the sutures.  I measured a 23 cm palindrome catheter on the chest  wall.  An incision was made on the left chest wall and the catheter was then tunneled in the chest wall under the skin making sure the cuff was under the skin and it was tunneled to the internal jugular access site.  I then dilated over the wire and placed a large left dilator peel-away sheath into the right atrium.  Tip of the catheter was then placed into the right atrium and the sheath was peeled away.  I adjusted the catheter under fluoroscopic guidance to assure it was not kinked.  It was loaded with heparinized saline.  The catheter was then secured with multiple 3-0 nylon sutures.  The skin was closed with 4-0 Monocryl subcuticular.  Dermabond was applied.  The catheter was loaded according to manufacturer recommendation. ? ?I then turned my attention to the right upper arm AV fistula that had already been prepped into the field.  I used sterile ultrasound to mark the course of the fistula.  Longitudinal incision was then made over the fistula including ellipsing the ulcerated skin.  I then used Bovie cautery to fully mobilized the fistula including the large aneurysmal segment through this incision in the upper arm.  There was extensive scar tissue here given previous revision in the past.  Once the fistula was fully mobilized, I got Vesseloops for proximal distal control.  Patient was given 3000 units of IV heparin.  I then used fistula clamps proximal and distal.  The anterior wall of the fistula throughout the aneurysmal segment segment including the ulcerated skin was then resected with Potts scissors.  Once I had a normal caliber lumen I then  reapproximated the anterior wall of the fistula with a running 5-0 Prolene and de-aired this prior to completion with the help of my assistant.  Several additional repair stitches were placed.  The wound was copiously irrigated.  Hemostasis was obtained.  A closed the skin with a 3-0 Vicryl and skin was closed with 4-0 Monocryl Dermabond and a dry sterile pressure  dressing was placed.  Taken to recovery in stable condition. ? ?Complications: None ? ?Condition: Stable ? ?Marty Heck, MD ?[facility] ?Office: [phone] ? ? ?Marty Heck ? ? ?

## 2021-10-24 ENCOUNTER — Encounter (HOSPITAL_COMMUNITY): Payer: Self-pay | Admitting: Radiology

## 2021-10-24 ENCOUNTER — Encounter (HOSPITAL_COMMUNITY): Payer: Self-pay | Admitting: Vascular Surgery

## 2021-10-24 DIAGNOSIS — I6340 Cerebral infarction due to embolism of unspecified cerebral artery: Secondary | ICD-10-CM | POA: Diagnosis not present

## 2021-10-24 LAB — GLUCOSE, CAPILLARY
Glucose-Capillary: 115 mg/dL — ABNORMAL HIGH (ref 70–99)
Glucose-Capillary: 124 mg/dL — ABNORMAL HIGH (ref 70–99)
Glucose-Capillary: 135 mg/dL — ABNORMAL HIGH (ref 70–99)
Glucose-Capillary: 139 mg/dL — ABNORMAL HIGH (ref 70–99)
Glucose-Capillary: 144 mg/dL — ABNORMAL HIGH (ref 70–99)
Glucose-Capillary: 145 mg/dL — ABNORMAL HIGH (ref 70–99)
Glucose-Capillary: 154 mg/dL — ABNORMAL HIGH (ref 70–99)

## 2021-10-24 MED ORDER — SODIUM CHLORIDE 0.9 % IV SOLN: 100.0000 mL | INTRAVENOUS | Status: DC | PRN

## 2021-10-24 MED ORDER — HYDROMORPHONE HCL 1 MG/ML IJ SOLN
0.5000 mg | INTRAMUSCULAR | Status: DC | PRN
  Administered 2021-10-24 – 2021-10-25 (×3): 0.5 mg via INTRAVENOUS
  Filled 2021-10-24 (×5): qty 0.5

## 2021-10-24 MED ORDER — MORPHINE SULFATE (PF) 2 MG/ML IV SOLN: 2.0000 mg | INTRAVENOUS | Status: DC | PRN

## 2021-10-24 MED ORDER — MEDIHONEY WOUND/BURN DRESSING EX PSTE
1.0000 "application " | PASTE | Freq: Every day | CUTANEOUS | Status: DC
  Administered 2021-10-24 – 2021-10-25 (×2): 1 via TOPICAL
  Filled 2021-10-24: qty 44

## 2021-10-24 MED ORDER — FREE WATER
30.0000 mL | Status: DC
  Administered 2021-10-24 – 2021-10-25 (×5): 30 mL

## 2021-10-24 MED ORDER — MIDODRINE HCL 5 MG PO TABS
5.0000 mg | ORAL_TABLET | Freq: Three times a day (TID) | ORAL | Status: DC
  Administered 2021-10-24 – 2021-10-25 (×5): 5 mg via ORAL
  Filled 2021-10-24 (×5): qty 1

## 2021-10-24 MED ORDER — MIDODRINE HCL 5 MG PO TABS: 5.0000 mg | ORAL_TABLET | Freq: Three times a day (TID) | ORAL | Status: DC

## 2021-10-24 NOTE — Progress Notes (Signed)
removed 381 mls net fluid unable to remove more due to hypotension.  pre bp 87/29 (bp cuff on leg) post bp 105/17 pre weight 63.4kg post weight 63.2kg .  catheter ran well is tender from placement, pt refused dressing change,  also fresh post op dressing.  pt had 5 seperate watery bms during treatment.  catheter packed with heparin clamped and capped.   ?

## 2021-10-24 NOTE — Progress Notes (Signed)
Nutrition Follow-up ? ?DOCUMENTATION CODES:  ?Non-severe (moderate) malnutrition in context of acute illness/injury ? ?INTERVENTION:  ?Continue TF via Cortrak: ?-Nepro @ 69m/hr (9666m ?-4525mrosource TF BID ?-69m31mee water Q4H to maintain tube patency  ? ?Provides 1944 kcals, 99 grams protein, 697ml56me water ? ?Recommend placement of PEG for long-term nutrition support ? ?-continue renavite daily ?-continue juven BID for wound healing ? ?NUTRITION DIAGNOSIS:  ?Moderate Malnutrition related to acute illness (osteomyelitis of R foot) as evidenced by mild fat depletion, moderate muscle depletion. -- ongoing ? ?GOAL:  ?Patient will meet greater than or equal to 90% of their needs -- met w/ TF at goal ? ?MONITOR:  ?PO intake, Supplement acceptance, Labs, Weight trends, TF tolerance, Skin, I & O's ? ?REASON FOR ASSESSMENT:  ?Consult ?Assessment of nutrition requirement/status, Enteral/tube feeding initiation and management ? ?ASSESSMENT:  ?40 y.20 female with hx of DM type 2, ESRD on HD, HTN, anemia, and recent transmetatarsal amputation (1/4) presented to ED from HD center with altered mental status after finishing treatment. In ED, imaging suggested osteomyelitis of the right foot. ? ?02/05 - s/p R BKA ?02/11 - NG tube placed, tube feeds initiated ?02/13 - CT abdomen/pelvis negative for rectovaginal fistula ?02/15 - NG tube removed, diet advanced to dysphagia 2 with thin liquids, due to waning mental status, a Cortrak was placed for nocturnal tube feeds  ?2/20 - s/p TEE, no thrombus detected, small PFO noted ?3/01 - PLEX initiated; s/p R AKA ?3/06 -  L IJ Trialysis HD CVC 20cm to mid RA ?3/13 - IJ tunneled HD cath placed and revision of R arm AV fistula ? ?Pt has completed PLEX. Discussed pt with RN, SLP, and MD. Pt continues to have inadequate oral intake and poor appetite, little interest in food at this time. Recommended pursue PEG placement given ongoing inadequate intake. SLP agrees with plan. MD aware.   ? ?Last HD 3/13 w/ 381ml 23mUF ?Admit wt: 55 kg ?Post-HD/current wt: 63.4 kg  ?EDW: 59.5 kg  ? ?Current TF via cortrak: Nepro @ 40ml/h42m5ml pr73mrce TF BID ? ?Medications: sensipar, aranesp, SSI Q4H, 5 units semglee BID, senokot, thiamine, renavit, protonix, renvela TID, Juven BID ?Labs reviewed. ?CBGs: 89-203 x 24 hours  ? ?Diet Order:   ?Diet Order   ? ?       ?  DIET DYS 2 Room service appropriate? No; Fluid consistency: Thin  Diet effective now       ?  ? ?  ?  ? ?  ? ?EDUCATION NEEDS: ?Not appropriate for education at this time ? ?Skin:  Skin Assessment: Skin Integrity Issues: ?Skin Integrity Issues:: Other (Comment), Stage II ?Stage II: sacrum ?Incisions: R BKA ?Other: MASD bilateral buttocks ? ?Last BM:  3/13 ? ?Height:  ?Ht Readings from Last 1 Encounters:  ?10/02/21 '5\' 1"'  (1.549 m)  ? ?Weight:  ?Wt Readings from Last 1 Encounters:  ?10/24/21 63.4 kg  ? ?BMI:  Body mass index is 26.41 kg/m?. ? ?Estimated Nutritional Needs: ?Kcal:  1800-2000 kcal/d ?Protein:  90-100 g/d ?Fluid:  1L+UOP ? ? ? ? ?[name] ATheone StanleyD, LDN (she/her/hers) ?RD pager number and weekend/on-call pager number located in Amion. ?[place]

## 2021-10-24 NOTE — Progress Notes (Signed)
Subjective: Woken from sleep slightly groggy but no current complaints thinks it is Monday remembers dialysis yesterday ? ?Objective ?Vital signs in last 24 hours: ?Vitals:  ? 10/24/21 0200 10/24/21 0230 10/24/21 0301 10/24/21 0500  ?BP: (!) 99/20 (!) 96/17 (!) 107/39 (!) 90/39  ?Pulse: 93 90 89 93  ?Resp:  14 12 14   ?Temp:   98.4 ?F (36.9 ?C) 98.5 ?F (36.9 ?C)  ?TempSrc:   Oral Oral  ?SpO2:  100% 97% 100%  ?Weight:  63.2 kg  63.4 kg  ?Height:      ? ?Weight change:  ? ?Physical Exam: ?General: Obese chronically ill female NAD, facial edema stable ?Heart: RRR no MRG ?Lungs: CTA nonlabored breathing ?Abdomen: Obese, NTND, NG tube feeding in place ?Extremities: Decreasing right arm swelling, no lower extremity edema, R AKA ?Dialysis Access: R  U Arm AVF+ bruit /RUA AVF incision clean and dry.  Left IJTCath dressing dry clear ?  ?  ?Dialysis Orders: ?[facility] (off schedule in hospital M WF) ?3h 23min   400/ 600   59.5kg   2/2 bath  UFP 2   RU AV fistula   ?Hep 4000 units IV TIW ?Mircera 200 mcg IV every 2 weeks - last 1/26 ?Ancef 2g 3 times weekly HD ?Covid neg 2/3, hep B SAg neg 2/4 ?  ?  ?Problem/Plan ?Embolic stroke with AMS -neuro following, acute embolic CVA /   TEE showed small PFO and no clot. Blood Cx 2/4 negative. S/p course of IV abx.  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 Getting PLEX QOD. 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  ?ESRD - OP TTS schedule - Currently off op hd schedule. Having HD on MWF, PLEX on T,TH,S. Next HD  10/25/2021.  ?Ulcerated aneurysmal area R UA AVF thinning skin: Status post VVS  Dr Carlis Abbott 3/13 revision and left IJ tunneled catheter ?Facial edema- AM of 10/15/21--> ? Delayed reaction to PLEX with FFP vs SVC-syndrome mar reviewed, no new meds. Received Solumedrol/ benadryl/  famotidine, R EJ catheter removed. S/p L IJ trialysis HD cath in IR 3/6 by Dr. Vernard Gambles. ,  Right arm swelling improved today ?HTN/volume - noted episodes of hypotension during HD,  improved with fluid /midodrine with HD. Below prior EDW, will need to be lowered on d/c. 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 Trula Slade VVS following.  To go to CIR at some point.  ?Sepsis/ shock - resolved  ?Anemia of CKD- last Hgb stable 9.2 continue Aranesp 281mcg q Friday. Last dose 10/20/2021. Transfuse as needed.   ?MBD: CorrCa high, Phos ok. Continue home sensipar/binders, hold VDRA for now.   ?Severe protein calorie malnutrition: NG tube in place with Nepro TF's + supplements. ?DMT2 - per PMD  ? ?Ernest Haber, PA-C ?[facility] ?Beeper [phone] ?10/24/2021,8:22 AM ? LOS: 39 days  ? ?Labs: ?Basic Metabolic Panel: ?Recent Labs  ?Lab 10/18/21 ?0650 10/19/21 ?0144 10/20/21 ?0345 10/21/21 ?1259 10/23/21 ?0747  ?NA 139   < > 138 136 136  ?K 3.7   < > 3.8 3.6 3.8  ?CL 96*   < > 95* 98 95*  ?CO2 32   < > 30 26 29   ?GLUCOSE 150*   < > 269* 83 106*  ?BUN 50*   < >  60* 46* 61*  ?CREATININE 4.70*   < > 4.62* 4.42* 5.76*  ?CALCIUM 10.4*   < > 10.2 10.2 10.4*  ?PHOS 4.4  --   --   --   --   ? < > = values in this interval not displayed.  ? ?Liver Function Tests: ?Recent Labs  ?Lab 10/18/21 ?1287  ?AST 20  ?ALT 15  ?ALKPHOS 59  ?BILITOT 0.4  ?PROT 6.1*  ?ALBUMIN 2.6*  ? ?No results for input(s): LIPASE, AMYLASE in the last 168 hours. ?No results for input(s): AMMONIA in the last 168 hours. ?CBC: ?Recent Labs  ?Lab 10/18/21 ?0650 10/20/21 ?0345 10/23/21 ?0747  ?WBC 12.4* 11.9* 9.3  ?NEUTROABS  --  7.5  --   ?HGB 9.3* 8.8* 9.2*  ?HCT 32.1* 30.6* 31.6*  ?MCV 97.0 97.5 95.2  ?PLT 320 268 296  ? ?Cardiac Enzymes: ?No results for input(s): CKTOTAL, CKMB, CKMBINDEX, TROPONINI in the last 168 hours. ?CBG: ?Recent Labs  ?Lab 10/23/21 ?2027 10/23/21 ?2227 10/24/21 ?0036  10/24/21 ?0308 10/24/21 ?0800  ?GLUCAP 203* 163* 135* 115* 145*  ? ? ?Studies/Results: ?DG Chest Port 1 View ? ?Result Date: 10/23/2021 ?CLINICAL DATA:  Postop placement of left internal jugular dialysis catheter. EXAM: PORTABLE CHEST 1 VIEW COMPARISON:  10/20/2021 FINDINGS: Left internal jugular dialysis catheter terminates at the superior caval/atrial junction or high right atrium. Feeding tube extends beyond the inferior aspect of the film. Midline trachea. Normal heart size for level of inspiration. No pleural effusion or pneumothorax. Suspect pulmonary venous congestion, increased. Developing left base airspace disease. Numerous leads and wires project over the chest. IMPRESSION: No pneumothorax or other acute complication. Developing pulmonary venous congestion, increased. Left base airspace disease, most likely atelectasis. Electronically Signed   By: Abigail Miyamoto M.D.   On: 10/23/2021 14:06   ?Medications: ? ? atorvastatin  40 mg Per Tube Daily  ? Chlorhexidine Gluconate Cloth  6 each Topical Q0600  ? cinacalcet  90 mg Oral Q supper  ? darbepoetin (ARANESP) injection - DIALYSIS  200 mcg Intravenous Q Fri-HD  ? dorzolamide-timolol  1 drop Left Eye BID  ? feeding supplement (NEPRO CARB STEADY)  960 mL Per Tube Q24H  ? feeding supplement (PROSource TF)  45 mL Per Tube BID  ? heparin injection (subcutaneous)  5,000 Units Subcutaneous Q8H  ? insulin aspart  0-6 Units Subcutaneous Q4H  ? insulin glargine-yfgn  5 Units Subcutaneous BID  ? mouth rinse  15 mL Mouth Rinse BID  ? multivitamin  1 tablet Per Tube QHS  ? nutrition supplement (JUVEN)  1 packet Per Tube BID BM  ? nystatin  5 mL Oral QID  ? pantoprazole (PROTONIX) IV  40 mg Intravenous Q24H  ? sennosides  5 mL Per Tube BID  ? sevelamer carbonate  1.6 g Per Tube TID WC  ? thiamine  100 mg Per Tube Daily  ? ? ? ? ?

## 2021-10-24 NOTE — Progress Notes (Signed)
Inpatient Rehab Admissions Coordinator: .  ? ?CIR following, Pt. Not quite medically ready. Will await determination on whether or not she needs PEG. ? ?Clemens Catholic, MS, CCC-SLP ?Rehab Admissions Coordinator  ?[phone] (celll) ?[phone] (office) ? ?

## 2021-10-24 NOTE — Progress Notes (Signed)
Physical Therapy Treatment ?Patient Details ?Name: [name] ?MRN: [id number] ?DOB: [birth year] ?Today's Date: 10/24/2021 ? ? ?History of Present Illness Pt is a 41 y.o. female admitted 09/15/21 post-HD session with concern for AMS when pt stopped speaking to staff. 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 Pt with increased somnolence 2/6; noted to be more lethargic in HD 2/7, 2/9, 2/11. Head CT 2/10 negative for acute injury; chronic small vessel disease and infarcts. MRI 2/10 with scattered punctate acute infarcts bilateral cerebral hemispheres (may be embolic), potential subacute infarct L corona radiata. Pt with persistent bilateral ptosis (pt reports ongoing ~1 month). MRI orbits 2/25 with evolution of infarcts seen on prior study; no acute infarct; chronic R retinal detachment. Per neurology 2/28, high suspicion for miller fischer GBS; to proceed with PLEX treatment. S/p R IJ temporary HD cath placement 3/1. 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 Pt with worsening face/throat swelling; s/p R IJ removal 3/5. S/p L IJ HD cath 3/6. PMH includes ESRD (HD TTS), DM2, HTN, neuropathy, R eye blindness (2008). ? ?  ?PT Comments  ? ? Pt received supine and agreeable to session with focus on progression of functional mobility. BP soft throughout session, remaining stable, transfers deferred. Pt requiring min guard to roll to R, min-mod assist to roll to L and mod assist to maintain L sidelying for LE therex. Pt with fair tolerance for LE therex, with ability to follow commands. Pt able to self direct care at conclusion of session for pillow placement for comfort and positioning. Pt continues to benefit from skilled PT services to progress toward functional mobility goals.  ?  ?Recommendations for follow up therapy are one component of a multi-disciplinary discharge planning process, led by the attending physician.  Recommendations may be updated based on patient status, additional functional criteria and  insurance authorization. ? ?Follow Up Recommendations ? Acute inpatient rehab (3hours/day) ?  ?  ?Assistance Recommended at Discharge Frequent or constant Supervision/Assistance  ?Patient can return home with the following Assistance with cooking/housework;Assistance with feeding;Direct supervision/assist for medications management;Direct supervision/assist for financial management;Assist for transportation;Help with stairs or ramp for entrance;A lot of help with bathing/dressing/bathroom;A lot of help with walking and/or transfers ?  ?Equipment Recommendations ? BSC/3in1;Wheelchair (measurements PT);Wheelchair cushion (measurements PT);Hospital bed;Other (comment) (lift equipment)  ?  ?Recommendations for Other Services Rehab consult ? ? ?  ?Precautions / Restrictions Precautions ?Precautions: Fall;Other (comment) ?Precaution Comments: R BKA now R AKA; bilateral ptosis with blindness; bowel incontinence; PLEX treatment every other day (cannot mobilize while running per neuro MD) ?Required Braces or Orthoses: Other Brace ?Other Brace: RLE limb guard ?Restrictions ?Weight Bearing Restrictions: Yes ?RUE Weight Bearing: Weight bearing as tolerated ?RLE Weight Bearing: Non weight bearing  ?  ? ?Mobility ? Bed Mobility ?Overal bed mobility: Needs Assistance ?Bed Mobility: Rolling ?Rolling: Mod assist, Min guard ?  ?  ?  ?  ?General bed mobility comments: Rolling R min guard, rolling L min-mod assist, pt able to initiate, assist needed to maintain sidelying ?  ? ?Transfers ?  ?  ?  ?  ?  ?  ?  ?  ?  ?General transfer comment: transfers defered this session , BP soft but stable ?  ? ?Ambulation/Gait ?  ?  ?  ?  ?  ?  ?  ?  ? ? ?Stairs ?  ?  ?  ?  ?  ? ? ?Wheelchair Mobility ?  ? ?Modified Rankin (Stroke Patients Only) ?  ? ? ?  ?  Balance Overall balance assessment: Needs assistance ?Sitting-balance support: Feet supported, Bilateral upper extremity supported, Single extremity supported ?Sitting balance-Leahy Scale:  Poor ?Sitting balance - Comments: Demonstrates significant improvement in tolerance to sitting; initially requiring modA to maintain midline, but progressed to min guard in chair with pt able to use UEs to shift weight while in chair with pillow under right UE,  reliant on UE support ?  ?  ?  ?  ?  ?  ?  ?  ?  ?  ?  ?  ?  ?  ?  ?  ? ?  ?Cognition Arousal/Alertness: Awake/alert ?Behavior During Therapy: Flat affect ?Overall Cognitive Status: Difficult to assess ?Area of Impairment: Attention, Following commands, Safety/judgement, Awareness, Problem solving ?  ?  ?  ?  ?  ?  ?  ?  ?Orientation Level: Place, Time, Situation ?Current Attention Level: Sustained ?Memory: Decreased recall of precautions, Decreased short-term memory ?Following Commands: Follows one step commands with increased time ?Safety/Judgement: Decreased awareness of safety, Decreased awareness of deficits ?Awareness: Emergent ?Problem Solving: Slow processing, Difficulty sequencing, Requires verbal cues, Requires tactile cues ?General Comments: pt motivated to participate, demonstrates improving awareness and ability to communicate needs; improved effort with activity ?  ?  ? ?  ?Exercises General Exercises - Lower Extremity ?Ankle Circles/Pumps: Left, 5 reps, Supine ?Quad Sets: AROM, Left, 5 reps, Supine ?Gluteal Sets: AROM, Both, 10 reps, Supine ?Hip ABduction/ADduction: AROM, Right, Left, 5 reps, Sidelying ?Other Exercises ?Other Exercises: sidelying knee flexion L x10 ? ?  ?General Comments   ?  ?  ? ?Pertinent Vitals/Pain Pain Assessment ?Pain Assessment: 0-10 ?Pain Score: 8  ?Pain Location: R arm, buttocks ?Pain Descriptors / Indicators: Discomfort, Grimacing, Aching ?Pain Intervention(s): Limited activity within patient's tolerance, Monitored during session, Repositioned  ? ? ?Home Living   ?  ?  ?  ?  ?  ?  ?  ?  ?  ?   ?  ?Prior Function    ?  ?  ?   ? ?PT Goals (current goals can now be found in the care plan section) Acute Rehab PT  Goals ?Patient Stated Goal: to get to rehab ?PT Goal Formulation: With patient ?Time For Goal Achievement: 11/02/21 ? ?  ?Frequency ? ? ? Min 4X/week ? ? ? ?  ?PT Plan Current plan remains appropriate  ? ? ?Co-evaluation PT/OT/SLP Co-Evaluation/Treatment: Yes ?Reason for Co-Treatment: Complexity of the patient's impairments (multi-system involvement);For patient/therapist safety ?PT goals addressed during session: Mobility/safety with mobility;Strengthening/ROM ?  ?  ? ?  ?AM-PAC PT "6 Clicks" Mobility   ?Outcome Measure ? Help needed turning from your back to your side while in a flat bed without using bedrails?: A Lot ?Help needed moving from lying on your back to sitting on the side of a flat bed without using bedrails?: A Lot ?Help needed moving to and from a bed to a chair (including a wheelchair)?: Total ?Help needed standing up from a chair using your arms (e.g., wheelchair or bedside chair)?: Total ?Help needed to walk in hospital room?: Total ?Help needed climbing 3-5 steps with a railing? : Total ?6 Click Score: 8 ? ?  ?End of Session   ?Activity Tolerance: Patient tolerated treatment well;Patient limited by pain ?Patient left: in bed;with bed alarm set;with call bell/phone within reach (OT came in after pt.) ?Nurse Communication: Mobility status ?PT Visit Diagnosis: Other abnormalities of gait and mobility (R26.89);Muscle weakness (generalized) (M62.81);Difficulty in walking, not elsewhere classified (R26.2);Other symptoms and  signs involving the nervous system (R29.898) ?Pain - Right/Left: Right ?Pain - part of body: Leg ?  ? ? ?Time: 9826-4158 ?PT Time Calculation (min) (ACUTE ONLY): 30 min ? ?Charges:  $Therapeutic Activity: 8-22 mins          ?          ? ?Audry Riles. PTA ?Acute Rehabilitation Services ?Office: [phone] ? ? ? ?[name] ?10/24/2021, 10:11 AM ? ?

## 2021-10-24 NOTE — Anesthesia Postprocedure Evaluation (Signed)
Anesthesia Post Note ? ?Patient: Khloie Hamada ? ?Procedure(s) Performed: REVISON OF ARTERIOVENOUS FISTULA  ARM AND PLICATION (Right: Arm Upper) ?INSERTION OF TUNNELED PALINDROME PRECISION DIALYSIS CATHETER (23cm) (Left: Neck) ? ?  ? ?Patient location during evaluation: PACU ?Anesthesia Type: General ?Level of consciousness: sedated and patient cooperative ?Pain management: pain level controlled ?Vital Signs Assessment: post-procedure vital signs reviewed and stable ?Respiratory status: spontaneous breathing ?Cardiovascular status: stable ?Anesthetic complications: no ? ? ?No notable events documented. ? ?Last Vitals:  ?Vitals:  ? 10/24/21 0500 10/24/21 1342  ?BP: (!) 90/39   ?Pulse: 93 89  ?Resp: 14   ?Temp: 36.9 ?C 36.6 ?C  ?SpO2: 100%   ?  ?Last Pain:  ?Vitals:  ? 10/24/21 1342  ?TempSrc: Axillary  ?PainSc:   ? ? ?  ?  ?  ?  ?  ?  ? ?Nolon Nations ? ? ? ? ?

## 2021-10-24 NOTE — TOC Progression Note (Signed)
Transition of Care (TOC) - Progression Note  ? ? ?Patient Details  ?Name: [name] ?MRN: [id number] ?Date of Birth: 1981/01/25 ? ?Transition of Care (TOC) CM/SW Contact  ?Sharin Mons, RN ?Phone Number: ?10/24/2021, 4:34 PM ? ?Clinical Narrative:    ?NCM received consult for LTAC. NCM spoke with pt @ bedside regarding transition to LTAC. NCM asked pt if it's ok to share with sister [name]. Pt stated yes, however, the pt states she makes her own decisions with her sister's help. States she will speak with sister concerning LTAC and make a decision. Pt stated would like to stay in [facility] if LTAC is agreed upon.  ?Pt ok to speak with [facility]. NCM spoke with Silvio Clayman to f/u with pt on tomorrow. ? ?TOC team will monitor and assist with needs... ? ? ?Expected Discharge Plan: Long Term Acute Care (LTAC) vs CIR ?Barriers to Discharge: Continued Medical Work up ? ?Expected Discharge Plan and Services ?Expected Discharge Plan: [place] (LTAC) ?In-house Referral: Clinical Social Work ?  ?Post Acute Care Choice: [place] ?Living arrangements for the past 2 months: Apartment ?                ?  ?  ?  ?  ?  ?  ?  ?  ?  ?  ? ? ?Social Determinants of Health (SDOH) Interventions ?  ? ?Readmission Risk Interventions ?Readmission Risk Prevention Plan 08/23/2021  ?Transportation Screening Complete  ?[place] or Home Care Consult Complete  ?Social Work Consult for [place] Planning/Counseling Complete  ?Palliative Care Screening Not Applicable  ?Medication Review Press photographer) Complete  ?Some recent data might be hidden  ? ? ?

## 2021-10-24 NOTE — Progress Notes (Signed)
?PROGRESS NOTE ? ?[name]  PJK:[id number] DOB: 1981/07/14 DOA: 09/15/2021 ?PCP: [facility]  ? ?Brief Narrative: ? ?[name] is a 41 y.o. female with medical history significant for poorly controlled DMT2, ESRD on TTS HD, HTN with poor compliance with medical treatment and HD was sent after completing dialysis session due to staff concern for altered mental status.  She has a recent history of TMA of the right foot about 2 months ago, grew MSSA and was on Ancef with dialysis.  Vascular surgery as well as nephrology consulted.  Hospital course complicated by an episode of lethargy, minimal responsiveness on 2/10 requiring rapid response.  An MRI of the brain showed scattered punctate acute infarcts bilaterally.  Neurology consulted.  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  Neurology recommended Plasmapheresis.  Received 5 course of Plex 3/1, 3/3, 3/4 ,3/7,3/9.  Hospital course remarkable for persistent dysphagia needing tube feeding by core track, IR also consulted for PEG consideration.  Waiting for speech therapy for final recommendation regarding decision on PEG.  If she swallows, no need for PEG ? ? ?Assessment & Plan: ? ?Principal Problem: ?  Cerebral embolism with cerebral infarction ?Active Problems: ?  GBS (Guillain-Barre syndrome) (HCC) ?  Anemia of chronic disease ?  Hyperglycemia due to type 2 diabetes mellitus ([place]) ?  ESRD on hemodialysis ([place]) ?  Acute encephalopathy ?  Osteomyelitis ([place]) ?  Pressure injury of skin ?  PFO (patent foramen ovale) ?  Malnutrition of moderate degree ?  Ptosis of both eyelids ?  Hypotension ? ? ?Assessment and Plan: ?* Cerebral embolism with cerebral infarction ?Embolic stroke, with positive bubble study -neurology consulted and following.  An MRI of the brain 2/10 showed scattered punctate acute infarcts throughout both cerebral hemispheres which appeared to be embolic. A  transesophageal echo done on 2/20 showed small PFO with no thrombus.  Completed stroke work-up.  Currently she is on statin, aspirin and Plavix ( on hold now for possible PEG procedure/will be resumed if plan canceled) ?Neurology recommended 30-day cardiac event monitoring as an outpatient ? ?GBS (Guillain-Barre syndrome) ([place]) ?Hospital course remarkable for bilateral ptosis, facial diplegia, ophthalmoplegia, hyperreflexia.  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  MRI of the brain did not show any intracranial mass or abnormal enhancement to explain patient's ptosis, ophthalmoplegia.  MRI of the orbits symmetric extraocular muscles, no abnormal enhancement of the optic nerve sheath.  Neurology recommended Plex. ?Per neurology, had catheter placed 3/1, started Plex.  Done on 3/1, 3/3, 3/4, 3/7 ,3/9 , neurology were following. Session 5/5 ?Neurology recommends EMG/nerve function test as an outpatient, outpatient neurology follow-up. ? ?Hypotension ?Currently blood pressure soft,on midodrine ? ?Ptosis of both eyelids ?Persistent problem.Thought to be from GBS ? ?Malnutrition of moderate degree ?Hospital course remarkable for dysphagia.  She has a core track in place. Dietitian following. speech therapy following her.  There was concern that she might need PEG for long-term nutrition so IR consulted, planning for PEG placement later this week.  We are following with his speech if she can swallow.  If she swallows, we will cancel the PEG placement plan ? ?Pressure injury of skin ?Pressure Injury 09/24/21 Sacrum Right;Left;Medial Stage 2 -  Partial thickness loss of dermis presenting as a shallow open injury with a red, pink wound bed without slough. (Active)  ?09/24/21 1600  ?Location: Sacrum  ?Location Orientation: Right;Left;Medial  ?Staging: Stage 2 -  Partial thickness loss  of dermis presenting as a shallow open injury with a red, pink wound bed without slough.  ?Wound Description (Comments):   ?Present  on Admission:   ? ? ?Wound care following ? 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  Wound looks good ? ?Acute encephalopathy ?Encephalopathy has improved.  Currently alert and oriented ? ?ESRD on hemodialysis ([place]) ?Nephrology following for dialysis.She has an AV fistula on the right upper extremity.S/P  revision of fistula on 3/13 ? ?Hyperglycemia due to type 2 diabetes mellitus ([place]) ?continue glargine and sliding scale.  .  Monitor blood sugars. continue current regimen ? ?Anemia of chronic disease ?Secondary to chronic kidney disease.  Currently hemoglobin stable.  Monitor ? ? ? ? ?  ? ? ?Nutrition Problem: Moderate Malnutrition ?Etiology: acute illness (osteomyelitis of R foot) ?  ? ?DVT prophylaxis:heparin injection 5,000 Units Start: 10/09/21 1400 ?SCD's Start: 09/17/21 1002 ? ? ?  Code Status: Full Code ? ?Family Communication: Called husband multiple times past few days,calls not received ? ?Patient status:Inpatient ? ?Patient is from :Home ? ?Anticipated discharge to:CIR ? ?Estimated DC date:Awaiting speech recommendation on diet, after that CIR alerted for readiness for discharge ? ? ?Consultants: neurology, nephrology ? ?Procedures: Hemodialysis ? ?Antimicrobials:  ?Anti-infectives (From admission, onward)  ? ? Start     Dose/Rate Route Frequency Ordered Stop  ? 10/23/21 0600  vancomycin (VANCOCIN) IVPB 1000 mg/200 mL premix       ? 1,000 mg ?200 mL/hr over 60 Minutes Intravenous To Short Stay 10/22/21 0654 10/23/21 1046  ? 09/26/21 1600  cefTRIAXone (ROCEPHIN) 2 g in sodium chloride 0.9 % 100 mL IVPB  Status:  Discontinued       ? 2 g ?200 mL/hr over 30 Minutes Intravenous Every 12 hours 09/26/21 1428 09/27/21 1356  ? 09/24/21 1700  cefTRIAXone (ROCEPHIN) 2 g in sodium chloride 0.9 % 100 mL IVPB  Status:  Discontinued       ? 2 g ?200 mL/hr over 30 Minutes  Intravenous Every 12 hours 09/24/21 0816 09/26/21 1428  ? 09/23/21 1800  meropenem (MERREM) 1 g in sodium chloride 0.9 % 100 mL IVPB  Status:  Discontinued       ? 1 g ?200 mL/hr over 30 Minutes Intravenous Every 24 hours 09/22/21 1016 09/24/21 0816  ? 09/23/21 1200  vancomycin (VANCOREADY) IVPB 500 mg/100 mL  Status:  Discontinued       ? 500 mg ?100 mL/hr over 60 Minutes Intravenous Every T-Th-Sa (Hemodialysis) 09/22/21 1016 09/25/21 0837  ? 09/22/21 1115  meropenem (MERREM) 1 g in sodium chloride 0.9 % 100 mL IVPB  Status:  Discontinued       ? 1 g ?200 mL/hr over 30 Minutes Intravenous Every 24 hours 09/22/21 1015 09/22/21 1016  ? 09/22/21 1115  meropenem (MERREM) 1 g in sodium chloride 0.9 % 100 mL IVPB       ? 1 g ?200 mL/hr over 30 Minutes Intravenous  Once 09/22/21 1016 09/22/21 1230  ? 09/22/21 1115  vancomycin (VANCOREADY) IVPB 1250 mg/250 mL  Status:  Discontinued       ? 1,250 mg ?166.7 mL/hr over 90 Minutes Intravenous  Once 09/22/21 1016 09/22/21 1029  ? 09/22/21 1115  vancomycin (VANCOCIN) IVPB 1000 mg/200 mL premix       ? 1,000 mg ?200 mL/hr over 60 Minutes Intravenous  Once 09/22/21 1029  09/22/21 1421  ? 09/19/21 1200  vancomycin (VANCOREADY) IVPB 500 mg/100 mL  Status:  Discontinued       ? 500 mg ?100 mL/hr over 60 Minutes Intravenous Every T-Th-Sa (Hemodialysis) 09/17/21 1046 09/19/21 0759  ? 09/17/21 0743  metroNIDAZOLE (FLAGYL) 500 MG/100ML IVPB       ?Note to Pharmacy: Nyoka Cowden D: cabinet override  ?    09/17/21 0743 09/17/21 0827  ? 09/16/21 1200  vancomycin (VANCOREADY) IVPB 750 mg/150 mL  Status:  Discontinued       ? 750 mg ?150 mL/hr over 60 Minutes Intravenous Every T-Th-Sa (Hemodialysis) 09/15/21 1748 09/17/21 1046  ? 09/15/21 2100  ceFEPIme (MAXIPIME) 1 g in sodium chloride 0.9 % 100 mL IVPB  Status:  Discontinued       ? 1 g ?200 mL/hr over 30 Minutes Intravenous Every 24 hours 09/15/21 1957 09/19/21 0759  ? 09/15/21 1800  ceFEPIme (MAXIPIME) 1 g in sodium chloride 0.9 % 100  mL IVPB  Status:  Discontinued       ? 1 g ?200 mL/hr over 30 Minutes Intravenous Every 24 hours 09/15/21 1742 09/15/21 1957  ? 09/15/21 1745  metroNIDAZOLE (FLAGYL) tablet 500 mg  Status:  Discontinued       ? 5

## 2021-10-24 NOTE — Progress Notes (Signed)
Pt noted with hypotension throughout the night with SBP 80s (while in dialysis)-low 100s and DBP low 30s. Pt asymptomatic and has had this trend for the past few days. Pt had revision of AV fistula yesterday and placement of tunneled dialysis catheter, receiving prn percocet for pain. Pt continuously yelling out after receiving prn dose of percocet 2 hours ago and still complaining of pain to right arm, reports "pain medicine is not working". Pt given tylenol at this time, BP 90/39, HR-93. Jamison Neighbor updated about pt's status.  ?

## 2021-10-24 NOTE — Progress Notes (Signed)
Speech Language Pathology Treatment: Dysphagia  ?Patient Details ?Name: [name] ?MRN: [id number] ?DOB: May 01, 1981 ?Today's Date: 10/24/2021 ?Time: 0233-4356 ?SLP Time Calculation (min) (ACUTE ONLY): 15 min ? ?Assessment / Plan / Recommendation ?Clinical Impression ? Pt reported her swallowing is better and was accepting of po's today. She consumed 4 oz applesauce and 2 bites of beef and reported "the beef is going to make me throw up." No s/sx aspiration. She is unable to effectively masticate po's higher than Dys 2 at present. Concern too is her intake and this SLP has observed pt to prefer textures that are smooth and typically is willing to eat those over particulate food (not sure if she ate more of that prior to throat pain and being made NPO). MD considering PEG and she may need that to sustain nutrition until she is able to eat more by mouth ?  ?HPI HPI: 41 y.o. female presented to ED 2/3 with AMS, gangrene RLE and osteomyelitis.  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 Change in mentation 2/10; MRI brain showed scattered punctate acute infarcts throughout both cerebral hemispheres. Made NPO 2/11. Per chart also suspicion for [name] given facial diplegia, ptosis, unreactive pupil.  Neurology recommends Plex (plasmpharesis exchange), status post HD catheter placed.  Received Plex 3/1, 3/3, 3/4. 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 Palliative care has been consulted in the setting of multiple acute on chronic disease processes to further discuss goals of care. Swallow eval ordered 2/13. ?  ?   ?SLP Plan ? Continue with current plan of care ? ?  ?  ?Recommendations for follow up therapy are one component of a multi-disciplinary discharge planning process, led by the attending physician.  Recommendations may be updated based on patient status, additional  functional criteria and insurance authorization. ?  ? ?Recommendations  ?Diet recommendations: Dysphagia 2 (fine chop);Thin liquid ?Liquids provided via: Straw ?Medication Administration: Via alternative means ?Supervision: Full supervision/cueing for compensatory strategies;Staff to assist with self feeding ?Compensations: Minimize environmental distractions;Slow rate;Small sips/bites;Follow solids with liquid ?Postural Changes and/or Swallow Maneuvers: Seated upright 90 degrees  ?   ?    ?   ? ? ? ? Oral Care Recommendations: Oral care BID ?Follow Up Recommendations: Skilled nursing-short term rehab (<3 hours/day) ?Assistance recommended at discharge: Frequent or constant Supervision/Assistance ?SLP Visit Diagnosis: Dysphagia, unspecified (R13.10);Dysphagia, oral phase (R13.11) ?Plan: Continue with current plan of care ? ? ? ? ?  ?  ? ? ?Houston Siren ? ?10/24/2021, 3:27 PM ?

## 2021-10-24 NOTE — Progress Notes (Signed)
[facility] ? ?Subjective  -crying ? ? ?Objective ?(!) 90/39 ?93 ?98.5 ?F (36.9 ?C) (Oral) ?14 ?100% ? ?Intake/Output Summary (Last 24 hours) at 10/24/2021 0815 ?Last data filed at 10/24/2021 0230 ?Gross per 24 hour  ?Intake 250 ml  ?Output 391 ml  ?Net -141 ml  ? ? ?Left IJ tunnel catheter clean dry and intact ?Right upper arm incision clean dry and intact after AV fistula revision, will not let me touch her arm ? ?Laboratory ?Lab Results: ?Recent Labs  ?  10/23/21 ?4481  ?WBC 9.3  ?HGB 9.2*  ?HCT 31.6*  ?PLT 296  ? ?BMET ?Recent Labs  ?  10/21/21 ?1259 10/23/21 ?0747  ?NA 136 136  ?K 3.6 3.8  ?CL 98 95*  ?CO2 26 29  ?GLUCOSE 83 106*  ?BUN 46* 61*  ?CREATININE 4.42* 5.76*  ?CALCIUM 10.2 10.4*  ? ? ?COAG ?Lab Results  ?Component Value Date  ? INR 1.2 08/10/2021  ? INR 1.1 06/30/2020  ? ?No results found for: PTT ? ?Assessment/Planning: ? ? ?Postop day 1 status post placement of left IJ tunneled dialysis catheter and a right arm AV fistula revision for aneurysmal ulceration.  Dressing removed this morning and incisions right upper arm clean dry and intact.  Left IJ catheter looks good as well.  Can use IJ catheter for now and allow fistula to heal over the next month. ? ?Marty Heck ?10/24/2021 ?8:15 AM ?-- ? ? ?

## 2021-10-24 NOTE — Progress Notes (Addendum)
Occupational Therapy Treatment ?Patient Details ?Name: [name] ?MRN: [id number] ?DOB: Oct 26, 1980 ?Today's Date: 10/24/2021 ? ? ?History of present illness Pt is a 41 y.o. female admitted 09/15/21 post-HD session with concern for AMS when pt stopped speaking to staff. 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 Pt with increased somnolence 2/6; noted to be more lethargic in HD 2/7, 2/9, 2/11. Head CT 2/10 negative for acute injury; chronic small vessel disease and infarcts. MRI 2/10 with scattered punctate acute infarcts bilateral cerebral hemispheres (may be embolic), potential subacute infarct L corona radiata. Pt with persistent bilateral ptosis (pt reports ongoing ~1 month). MRI orbits 2/25 with evolution of infarcts seen on prior study; no acute infarct; chronic R retinal detachment. Per neurology 2/28, high suspicion for miller fischer GBS; to proceed with PLEX treatment. S/p R IJ temporary HD cath placement 3/1. 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 Pt with worsening face/throat swelling; s/p R IJ removal 3/5. S/p L IJ HD cath 3/6. 3/13 s/p placement of left internal jugular vein tunneled dialysis catheter and   revision of right arm AV fistula. PMH includes ESRD (HD TTS), DM2, HTN, neuropathy, R eye blindness (2008). ?  ?OT comments ? Pt was able to complete bed mobility rolling to L side with min-mod assist and R with min guard as increase in edema in R arm and pain. Pt's soft in session but stable as completed light there ex for UE. Pt currently with functional limitations due to the deficits listed below (see OT Problem List).  Pt will benefit from skilled OT to increase their safety and independence with ADL and functional mobility for ADL to facilitate discharge to venue listed below.  ?  ? ?Recommendations for follow up therapy are one component of a multi-disciplinary discharge planning process, led by the attending physician.  Recommendations may be updated based on patient status,  additional functional criteria and insurance authorization. ?   ?Follow Up Recommendations ? Acute inpatient rehab (3hours/day)  ?  ?Assistance Recommended at Discharge Frequent or constant Supervision/Assistance  ?Patient can return home with the following ? A lot of help with walking and/or transfers;A lot of help with bathing/dressing/bathroom;Assistance with cooking/housework;Assistance with feeding;Direct supervision/assist for medications management;Direct supervision/assist for financial management;Assist for transportation ?  ?Equipment Recommendations ? Other (comment)  ?  ?Recommendations for Other Services Other (comment) ? ?  ?Precautions / Restrictions Precautions ?Precautions: Fall;Other (comment) ?Precaution Comments: R BKA now R AKA; bilateral ptosis with blindness; bowel incontinence; PLEX treatment every other day (cannot mobilize while running per neuro MD) ?Required Braces or Orthoses: Other Brace ?Other Brace: RLE limb guard ?Restrictions ?Weight Bearing Restrictions: Yes ?RUE Weight Bearing: Weight bearing as tolerated ?RLE Weight Bearing: Non weight bearing  ? ? ?  ? ?Mobility Bed Mobility ?Overal bed mobility: Needs Assistance ?Bed Mobility: Rolling ?Rolling: Mod assist, Min guard ?Sidelying to sit: Mod assist, +2 for safety/equipment ?Supine to sit: +2 for physical assistance, Min assist ?Sit to supine: +2 for physical assistance, Min assist ?Sit to sidelying: Mod assist ?General bed mobility comments: Rolling R min guard, rolling L min-mod assist, pt able to initiate, assist needed to maintain sidelying ?  ? ?Transfers ?  ?  ?  ?  ?  ?  ?  ?  ?  ?  ?  ?  ?Balance   ?  ?  ?  ?  ?  ?  ?  ?  ?  ?  ?  ?  ?  ?  ?  ?  ?  ?  ?   ? ?  ADL either performed or assessed with clinical judgement  ? ?ADL Overall ADL's : Needs assistance/impaired ?Eating/Feeding: NPO ?  ?Grooming: Wash/dry face;Minimal assistance;Cueing for safety;Cueing for sequencing;Bed level ?  ?Upper Body Bathing: Maximal  assistance;Cueing for safety;Cueing for sequencing;Bed level ?  ?Lower Body Bathing: Maximal assistance;Bed level ?  ?Upper Body Dressing : Maximal assistance;Bed level ?  ?Lower Body Dressing: Total assistance ?  ?  ?  ?  ?  ?  ?  ?  ?  ?  ? ?Extremity/Trunk Assessment Upper Extremity Assessment ?Upper Extremity Assessment: RUE deficits/detail ?RUE Deficits / Details: increase in edema in RUE and pt reported increase level of pain due to sx ?RUE Coordination: decreased fine motor;decreased gross motor ?  ?Lower Extremity Assessment ?Lower Extremity Assessment: Defer to PT evaluation ?  ?  ?  ? ?Vision   ?Additional Comments: Pt is blind ?  ?Perception Perception ?Perception: Impaired ?  ?Praxis Praxis ?Praxis: Impaired ?Praxis Impairment Details: Initiation;Motor planning ?  ? ?Cognition Arousal/Alertness: Awake/alert ?Behavior During Therapy: Flat affect ?Overall Cognitive Status: Difficult to assess ?Area of Impairment: Attention, Following commands, Safety/judgement, Awareness, Problem solving ?  ?  ?  ?  ?  ?  ?  ?  ?  ?Current Attention Level: Sustained ?Memory: Decreased recall of precautions, Decreased short-term memory ?Following Commands: Follows one step commands with increased time ?Safety/Judgement: Decreased awareness of safety, Decreased awareness of deficits ?Awareness: Emergent ?Problem Solving: Slow processing, Difficulty sequencing, Requires verbal cues, Requires tactile cues ?General Comments: pt motivated to participate, demonstrates improving awareness and ability to communicate needs; improved effort with activity ?  ?  ?   ?Exercises Exercises: General Upper Extremity ?General Exercises - Upper Extremity ?Shoulder Flexion: AAROM, Right, 5 reps, Supine (LUE AROM 10 reps x 2 sets in supine) ?Wrist Extension: Right, AROM, 10 reps, Supine ?Digit Composite Flexion: AROM, Strengthening, 10 reps ? ?  ?Shoulder Instructions   ? ? ?  ?General Comments    ? ? ?Pertinent Vitals/ Pain       Pain  Assessment ?Pain Assessment: 0-10 ?Pain Score: 8  ?Pain Location: R arm, buttocks ?Pain Descriptors / Indicators: Discomfort, Grimacing, Aching ?Pain Intervention(s): Limited activity within patient's tolerance, Monitored during session, Premedicated before session, Repositioned ? ?Home Living   ?  ?  ?  ?  ?  ?  ?  ?  ?  ?  ?  ?  ?  ?  ?  ?  ?  ?  ? ?  ?Prior Functioning/Environment    ?  ?  ?  ?   ? ?Frequency ? Min 2X/week  ? ? ? ? ?  ?Progress Toward Goals ? ?OT Goals(current goals can now be found in the care plan section) ? Progress towards OT goals: Progressing toward goals ? ?Acute Rehab OT Goals ?Patient Stated Goal: to keep trying to do therapy ?OT Goal Formulation: With patient ?Time For Goal Achievement: 11/02/21 ?Potential to Achieve Goals: Fair ?ADL Goals ?Pt Will Perform Eating: with set-up;sitting;with adaptive utensils ?Pt Will Perform Grooming: with modified independence;sitting;bed level ?Pt Will Perform Upper Body Bathing: with supervision;sitting ?Pt Will Perform Lower Body Bathing: with min assist;sit to/from stand ?Pt Will Perform Upper Body Dressing: with supervision;sitting ?Pt Will Perform Lower Body Dressing: with mod assist;sitting/lateral leans ?Pt Will Transfer to Toilet: with mod assist;stand pivot transfer;bedside commode ?Pt Will Perform Toileting - Clothing Manipulation and hygiene: sitting/lateral leans;with caregiver independent in assisting;with modified independence ?Additional ADL Goal #1: Pt will be able to sit  EOB with supervision ?Additional ADL Goal #2: Pt will be alert and oriented x4 without cues  ?Plan Discharge plan remains appropriate   ? ?Co-evaluation ? ? ?   ?Reason for Co-Treatment: Complexity of the patient's impairments (multi-system involvement) ?PT goals addressed during session: Mobility/safety with mobility;Strengthening/ROM ?OT goals addressed during session: ADL's and self-care;Strengthening/ROM ?  ? ?  ?AM-PAC OT "6 Clicks" Daily Activity     ?Outcome  Measure ? ? Help from another person eating meals?: Total ?Help from another person taking care of personal grooming?: A Little ?Help from another person toileting, which includes using toliet, bedpan, or urinal?: Total ?Help f

## 2021-10-24 NOTE — Progress Notes (Signed)
?  Progress Note ? ? ? ?10/24/2021 ?8:13 AM ?1 Day Post-Op ? ?Subjective:  Pain in nose, throat, R arm, R AKA, L knee ? ? ?Vitals:  ? 10/24/21 0301 10/24/21 0500  ?BP: (!) 107/39 (!) 90/39  ?Pulse: 89 93  ?Resp: 12 14  ?Temp: 98.4 ?F (36.9 ?C) 98.5 ?F (36.9 ?C)  ?SpO2: 97% 100%  ? ?Physical Exam: ?Lungs:  non labored ?Incisions:  R AKA incision c/d/I; dressing left in place R arm ?Extremities R hand with motor and sensation intact ?Neurologic: A&O ? ?CBC ?   ?Component Value Date/Time  ? WBC 9.3 10/23/2021 0747  ? RBC 3.32 (L) 10/23/2021 0747  ? HGB 9.2 (L) 10/23/2021 0747  ? HGB 10.6 (L) 05/18/2020 2902  ? HCT 31.6 (L) 10/23/2021 0747  ? HCT 35.1 05/18/2020 0927  ? PLT 296 10/23/2021 0747  ? PLT 186 05/18/2020 0927  ? MCV 95.2 10/23/2021 0747  ? MCV 89 05/18/2020 0927  ? MCH 27.7 10/23/2021 0747  ? MCHC 29.1 (L) 10/23/2021 0747  ? RDW 18.6 (H) 10/23/2021 0747  ? RDW 13.9 05/18/2020 0927  ? LYMPHSABS 2.3 10/20/2021 0345  ? LYMPHSABS 1.6 05/18/2020 0927  ? MONOABS 1.0 10/20/2021 0345  ? EOSABS 1.0 (H) 10/20/2021 0345  ? EOSABS 0.6 (H) 05/18/2020 1115  ? BASOSABS 0.1 10/20/2021 0345  ? BASOSABS 0.0 05/18/2020 5208  ? ? ?BMET ?   ?Component Value Date/Time  ? NA 136 10/23/2021 0747  ? NA 133 (L) 05/18/2020 0223  ? K 3.8 10/23/2021 0747  ? CL 95 (L) 10/23/2021 0747  ? CO2 29 10/23/2021 0747  ? GLUCOSE 106 (H) 10/23/2021 0747  ? BUN 61 (H) 10/23/2021 0747  ? BUN 45 (H) 05/18/2020 3612  ? CREATININE 5.76 (H) 10/23/2021 0747  ? CALCIUM 10.4 (H) 10/23/2021 0747  ? GFRNONAA 9 (L) 10/23/2021 0747  ? GFRAA 6 (L) 05/18/2020 2449  ? ? ?INR ?   ?Component Value Date/Time  ? INR 1.2 08/10/2021 2212  ? ? ? ?Intake/Output Summary (Last 24 hours) at 10/24/2021 0813 ?Last data filed at 10/24/2021 0230 ?Gross per 24 hour  ?Intake 250 ml  ?Output 391 ml  ?Net -141 ml  ? ? ? ?Assessment/Plan:  41 y.o. female is s/p revision of R arm AVF 1 Day Post-Op  ? ?R AKA healing well ?[facility] working well for HD overnight ?We will take R arm dressing down  tomorrow ?Ok for CIR when bed available ? ?Dagoberto Ligas, PA-C ?[facility] ?[phone] ?10/24/2021 ?8:13 AM ? ? ? ?

## 2021-10-24 NOTE — Consult Note (Addendum)
[place] Nurse wound follow up ?Patient receiving care in [place] ?Wound type: Unstageable pressure injury to the sacrum  ?MDRPI related to stump fixation device on the right upper lateral thigh.  ?Measurement: 6 cm x 9 cm with a central area of 25% yellow/brown eschar present and 75% pink  ?Right upper lateral thigh MDRPI from stump fixation device measuring 1.8 cm x 14 cm and is 100% pink.  ?Drainage (amount, consistency, odor) minimal tan/brown ?Periwound: intact ?Dressing procedure/placement/frequency: ?Apply thin layer (3 mm) MediHoney to the wound on the sacrum, cover with 4 x 4 then secure with a sacral foam dressing. Clean and apply the [place] once daily. ?Apply a cut to fit piece of Xeroform gauze over the MDRPI on the right upper thigh and secure with a foam dressing. Change daily.  ? ?WOC will follow weekly to assess wounds. ? ?Cathlean Marseilles. Tamala Julian, MSN, RN, CMSRN, AGCNS, WTA ?W[facility] Treatment Associate ?Pager [phone]  ?  ?

## 2021-10-24 NOTE — Progress Notes (Signed)
Dialysis Orders: ?[facility] (off schedule in hospital M WF) ?3h 55min   400/ 600   59.5kg   2/2 bath  UFP 2   RU AV fistula   ?Hep 4000 units IV TIW ?Mircera 200 mcg IV every 2 weeks - last 1/26 ?Ancef 2g 3 times weekly HD ?Covid neg 2/3, hep B SAg neg 2/4 ?  ?Problem/Plan ?Embolic stroke with AMS -neuro following, acute embolic CVA / Repeat MRI with no acute findings.  With PFO on TTE. TEE showed small PFO and no clot. Blood Cx 2/4 negative. S/p course of IV abx.  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  PLEX 3/11 (5th treatment). No further PLEX needed per neuro (5/5). 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 Plan for LP after 5 days off of ASA and Plavix.  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 Please see #12 for alternative access. ?ESRD - OP TTS schedule - Currently off schedule. Having HD on MWF, PLEX on T,TH,S. Tolerated HD 3/13 with net 381 ml.  Next HD 10/26/2021 (moving back to TTS regimen). ?Ulcerated area AVF with thinning skin: Asked VVS to see pt 10/20/2021. Sticking away from area. No bleeding so far. Going for revision of AVF today 10/23/2021.  ?Facial edema- AM of 10/15/21--> ? Delayed reaction to PLEX with FFP vs SVC- Mar reviewed, no new meds. Received Solumedrol/ benadryl/ famotidine, R EJ catheter removed and now w/ LIJ TC on 3/13 by Dr. Carlis Abbott. Since pt has had her AKA and no bleeding > 48 hrs out, OK to convert PLEX to albumin replacement fluid. ?HTN/volume - noted episodes of hypotension during HD,  improved with fluid /midodrine with HD. Below prior EDW, will need to be lowered on d/c. Avoid hypotension. Keep SPB >120.  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 Trula Slade VVS following.  To go to CIR at some point.  ?Sepsis/ shock - resolved  ?Anemia of CKD- last Hgb stable 8.8. Continue Aranesp 253mcg q Friday.  Last dose 10/20/2021. Transfuse as needed.   ?MBD: CorrCa high, Phos ok. Continue home sensipar/binders, hold VDRA for now.   ?Severe protein calorie malnutrition: NG tube in place with Nepro TF's + supplements. ?DMT2 - per PMD  ? ?Subjective:  Complain of severe right arm pain and now resting comfortably after dilaudid.  ? ?Objective ?Vital signs in last 24 hours: ?Vitals:  ? 10/24/21 0200 10/24/21 0230 10/24/21 0301 10/24/21 0500  ?BP: (!) 99/20 (!) 96/17 (!) 107/39 (!) 90/39  ?Pulse: 93 90 89 93  ?Resp:  14 12 14   ?Temp:   98.4 ?F (36.9 ?C) 98.5 ?F (36.9 ?C)  ?TempSrc:   Oral Oral  ?SpO2:  100% 97% 100%  ?Weight:  63.2 kg  63.4 kg  ?Height:      ? ?Weight change:  ? ?Physical Exam: ?General: Obese chronically ill female NAD, facial edema stable ?Heart: RRR no MRG ?Lungs: CTA nonlabored breathing ?Abdomen: Obese, NTND, NG tube feeding in place ?Extremities: No lower extremity edema, right AKA  ?Dialysis Access: R  U Arm AVF, Surgical scar, minimal ooze from one spot but strong bruit, LIJ TC ? ? ? ? ?Labs: ?Basic Metabolic Panel: ?Recent Labs  ?Lab 10/18/21 ?0650 10/19/21 ?0144 10/20/21 ?0345 10/21/21 ?1259 10/23/21 ?0747  ?NA 139   < > 138  136 136  ?K 3.7   < > 3.8 3.6 3.8  ?CL 96*   < > 95* 98 95*  ?CO2 32   < > 30 26 29   ?GLUCOSE 150*   < > 269* 83 106*  ?BUN 50*   < > 60* 46* 61*  ?CREATININE 4.70*   < > 4.62* 4.42* 5.76*  ?CALCIUM 10.4*   < > 10.2 10.2 10.4*  ?PHOS 4.4  --   --   --   --   ? < > = values in this interval not displayed.  ? ?Liver Function Tests: ?Recent Labs  ?Lab 10/18/21 ?5038  ?AST 20  ?ALT 15  ?ALKPHOS 59  ?BILITOT 0.4  ?PROT 6.1*  ?ALBUMIN 2.6*  ? ?No results for input(s): LIPASE, AMYLASE in the last 168 hours. ?No results for input(s): AMMONIA in the last 168 hours. ?CBC: ?Recent Labs  ?Lab 10/18/21 ?0650 10/20/21 ?0345 10/23/21 ?0747  ?WBC 12.4* 11.9* 9.3  ?NEUTROABS  --  7.5  --   ?HGB 9.3* 8.8* 9.2*  ?HCT 32.1* 30.6* 31.6*  ?MCV 97.0 97.5 95.2  ?PLT 320 268 296  ? ?Cardiac Enzymes: ?No  results for input(s): CKTOTAL, CKMB, CKMBINDEX, TROPONINI in the last 168 hours. ?CBG: ?Recent Labs  ?Lab 10/23/21 ?2027 10/23/21 ?2227 10/24/21 ?0036 10/24/21 ?0308 10/24/21 ?0800  ?GLUCAP 203* 163* 135* 115* 145*  ? ? ?Studies/Results: ?DG Chest Port 1 View ? ?Result Date: 10/23/2021 ?CLINICAL DATA:  Postop placement of left internal jugular dialysis catheter. EXAM: PORTABLE CHEST 1 VIEW COMPARISON:  10/20/2021 FINDINGS: Left internal jugular dialysis catheter terminates at the superior caval/atrial junction or high right atrium. Feeding tube extends beyond the inferior aspect of the film. Midline trachea. Normal heart size for level of inspiration. No pleural effusion or pneumothorax. Suspect pulmonary venous congestion, increased. Developing left base airspace disease. Numerous leads and wires project over the chest. IMPRESSION: No pneumothorax or other acute complication. Developing pulmonary venous congestion, increased. Left base airspace disease, most likely atelectasis. Electronically Signed   By: Abigail Miyamoto M.D.   On: 10/23/2021 14:06   ?Medications: ? ? ? atorvastatin  40 mg Per Tube Daily  ? Chlorhexidine Gluconate Cloth  6 each Topical Q0600  ? cinacalcet  90 mg Oral Q supper  ? darbepoetin (ARANESP) injection - DIALYSIS  200 mcg Intravenous Q Fri-HD  ? dorzolamide-timolol  1 drop Left Eye BID  ? feeding supplement (NEPRO CARB STEADY)  960 mL Per Tube Q24H  ? feeding supplement (PROSource TF)  45 mL Per Tube BID  ? heparin injection (subcutaneous)  5,000 Units Subcutaneous Q8H  ? insulin aspart  0-6 Units Subcutaneous Q4H  ? insulin glargine-yfgn  5 Units Subcutaneous BID  ? mouth rinse  15 mL Mouth Rinse BID  ? multivitamin  1 tablet Per Tube QHS  ? nutrition supplement (JUVEN)  1 packet Per Tube BID BM  ? nystatin  5 mL Oral QID  ? pantoprazole (PROTONIX) IV  40 mg Intravenous Q24H  ? sennosides  5 mL Per Tube BID  ? sevelamer carbonate  1.6 g Per Tube TID WC  ? thiamine  100 mg Per Tube Daily   ? ? ? ? ?

## 2021-10-25 ENCOUNTER — Ambulatory Visit (HOSPITAL_COMMUNITY)
Admission: RE | Admit: 2021-10-25 | Discharge: 2021-10-25 | Disposition: A | Discharge status: Home / Self Care | Payer: Medicare Other | Source: Ambulatory Visit | Attending: Student | Admitting: Student

## 2021-10-25 ENCOUNTER — Inpatient Hospital Stay
Admission: RE | Admit: 2021-10-25 | Discharge: 2021-11-20 | Disposition: A | Discharge status: Skilled Nursing Facility | Payer: Medicare Other

## 2021-10-25 DIAGNOSIS — Z4659 Encounter for fitting and adjustment of other gastrointestinal appliance and device: Secondary | ICD-10-CM

## 2021-10-25 DIAGNOSIS — I6340 Cerebral infarction due to embolism of unspecified cerebral artery: Secondary | ICD-10-CM | POA: Diagnosis not present

## 2021-10-25 LAB — CBC WITH DIFFERENTIAL/PLATELET
Abs Immature Granulocytes: 0.07 10*3/uL (ref 0.00–0.07)
Basophils Absolute: 0.1 10*3/uL (ref 0.0–0.1)
Basophils Relative: 1 %
Eosinophils Absolute: 1 10*3/uL — ABNORMAL HIGH (ref 0.0–0.5)
Eosinophils Relative: 8 %
HCT: 25.6 % — ABNORMAL LOW (ref 36.0–46.0)
Hemoglobin: 7.6 g/dL — ABNORMAL LOW (ref 12.0–15.0)
Immature Granulocytes: 1 %
Lymphocytes Relative: 19 %
Lymphs Abs: 2.5 10*3/uL (ref 0.7–4.0)
MCH: 28.7 pg (ref 26.0–34.0)
MCHC: 29.7 g/dL — ABNORMAL LOW (ref 30.0–36.0)
MCV: 96.6 fL (ref 80.0–100.0)
Monocytes Absolute: 0.9 10*3/uL (ref 0.1–1.0)
Monocytes Relative: 7 %
Neutro Abs: 8.6 10*3/uL — ABNORMAL HIGH (ref 1.7–7.7)
Neutrophils Relative %: 64 %
Platelets: 272 10*3/uL (ref 150–400)
RBC: 2.65 MIL/uL — ABNORMAL LOW (ref 3.87–5.11)
RDW: 19.4 % — ABNORMAL HIGH (ref 11.5–15.5)
WBC: 13.1 10*3/uL — ABNORMAL HIGH (ref 4.0–10.5)
nRBC: 0.3 % — ABNORMAL HIGH (ref 0.0–0.2)

## 2021-10-25 LAB — BASIC METABOLIC PANEL
Anion gap: 11 (ref 5–15)
BUN: 53 mg/dL — ABNORMAL HIGH (ref 6–20)
CO2: 26 mmol/L (ref 22–32)
Calcium: 9.2 mg/dL (ref 8.9–10.3)
Chloride: 96 mmol/L — ABNORMAL LOW (ref 98–111)
Creatinine, Ser: 3.9 mg/dL — ABNORMAL HIGH (ref 0.44–1.00)
GFR, Estimated: 14 mL/min — ABNORMAL LOW (ref >60)
Glucose, Bld: 154 mg/dL — ABNORMAL HIGH (ref 70–99)
Potassium: 3.2 mmol/L — ABNORMAL LOW (ref 3.5–5.1)
Sodium: 133 mmol/L — ABNORMAL LOW (ref 135–145)

## 2021-10-25 LAB — GLUCOSE, CAPILLARY
Glucose-Capillary: 136 mg/dL — ABNORMAL HIGH (ref 70–99)
Glucose-Capillary: 164 mg/dL — ABNORMAL HIGH (ref 70–99)
Glucose-Capillary: 130 mg/dL — ABNORMAL HIGH (ref 70–99)
Glucose-Capillary: 77 mg/dL (ref 70–99)

## 2021-10-25 MED ORDER — SENNOSIDES 8.8 MG/5ML PO SYRP: 5.0000 mL | ORAL_SOLUTION | Freq: Two times a day (BID) | ORAL | 0 refills | Status: DC

## 2021-10-25 MED ORDER — NEPRO/CARBSTEADY PO LIQD: 960.0000 mL | ORAL | 0 refills | Status: DC

## 2021-10-25 MED ORDER — JUVEN PO PACK: 1.0000 | PACK | Freq: Two times a day (BID) | ORAL | 0 refills | Status: DC

## 2021-10-25 MED ORDER — INSULIN ASPART 100 UNIT/ML IJ SOLN: 0.0000 [IU] | INTRAMUSCULAR | 11 refills | Status: DC

## 2021-10-25 MED ORDER — MIDODRINE HCL 5 MG PO TABS: 5.0000 mg | ORAL_TABLET | Freq: Three times a day (TID) | ORAL | Status: DC

## 2021-10-25 MED ORDER — ATORVASTATIN CALCIUM 40 MG PO TABS: 40.0000 mg | ORAL_TABLET | Freq: Every day | ORAL | Status: DC

## 2021-10-25 MED ORDER — THIAMINE HCL 100 MG PO TABS: 100.0000 mg | ORAL_TABLET | Freq: Every day | ORAL | Status: DC

## 2021-10-25 MED ORDER — POTASSIUM CHLORIDE 10 MEQ/100ML IV SOLN
10.0000 meq | INTRAVENOUS | Status: AC
  Administered 2021-10-25: 10 meq via INTRAVENOUS
  Filled 2021-10-25: qty 100

## 2021-10-25 MED ORDER — ALBUMIN HUMAN 25 % IV SOLN
INTRAVENOUS | Status: AC
  Filled 2021-10-25: qty 100

## 2021-10-25 MED ORDER — NYSTATIN 100000 UNIT/ML MT SUSP: 5.0000 mL | Freq: Four times a day (QID) | OROMUCOSAL | 0 refills | Status: DC

## 2021-10-25 MED ORDER — SEVELAMER CARBONATE 0.8 G PO PACK: 1.6000 g | PACK | Freq: Three times a day (TID) | ORAL | 0 refills | Status: AC

## 2021-10-25 MED ORDER — INSULIN GLARGINE-YFGN 100 UNIT/ML ~~LOC~~ SOLN: 5.0000 [IU] | Freq: Two times a day (BID) | SUBCUTANEOUS | 11 refills | Status: DC

## 2021-10-25 MED ORDER — HEPARIN SODIUM (PORCINE) 5000 UNIT/ML IJ SOLN: 5000.0000 [IU] | Freq: Three times a day (TID) | INTRAMUSCULAR | Status: DC

## 2021-10-25 MED ORDER — ALBUMIN HUMAN 25 % IV SOLN
25.0000 g | Freq: Once | INTRAVENOUS | Status: AC
  Administered 2021-10-25: 25 g via INTRAVENOUS

## 2021-10-25 MED ORDER — HYDROMORPHONE HCL 1 MG/ML IJ SOLN
0.5000 mg | INTRAMUSCULAR | 0 refills | Status: DC | PRN
Start: 2021-10-25 — End: 2022-02-11

## 2021-10-25 MED ORDER — HEPARIN SODIUM (PORCINE) 1000 UNIT/ML IJ SOLN
INTRAMUSCULAR | Status: AC
  Filled 2021-10-25: qty 4

## 2021-10-25 MED ORDER — FREE WATER: 30.0000 mL | Status: DC

## 2021-10-25 MED ORDER — PROSOURCE TF PO LIQD: 45.0000 mL | Freq: Two times a day (BID) | ORAL | Status: DC

## 2021-10-25 NOTE — Discharge Summary (Signed)
Physician Discharge Summary  ?Zaryah Seckel NVB:[id number] DOB: 07-Mar-1981 DOA: 09/15/2021 ? ?PCP: [facility] ? ?Admit date: 09/15/2021 ?Discharge date: 10/25/2021 ? ?Admitted From: Home ?Disposition:  [facility] ? ?Discharge Condition:Stable ?CODE STATUS:FULL ?Diet recommendation:  Dysphagia 2 ,tube diet ? ?Brief/Interim Summary: ?[name] is a 41 y.o. female with medical history significant for poorly controlled DMT2, ESRD on TTS HD, HTN with poor compliance with medical treatment and HD was sent after completing dialysis session due to staff concern for altered mental status.  She has a recent history of TMA of the right foot about 2 months ago, grew MSSA and was on Ancef with dialysis.  Vascular surgery as well as nephrology consulted.  Hospital course complicated by an episode of lethargy, minimal responsiveness on 2/10 requiring rapid response.  An MRI of the brain showed scattered punctate acute infarcts bilaterally.  Neurology consulted.  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  Neurology recommended Plasmapheresis.  Received 5 course of Plex 3/1, 3/3, 3/4 ,3/7,3/9.  Hospital course remarkable for persistent dysphagia needing tube feeding by core track, IR also consulted for PEG consideration.  Waiting for PEG by IR.  She has been recommended LTAC on discharge, medically stable for discharge today.  IR, nephrology will follow her at [facility]. ? ?Following problems were addressed during her hospitalization: ? ?Cerebral embolism with cerebral infarction ?Embolic stroke with positive bubble study -neurology were consulted and following.  An MRI of the brain 2/10 showed scattered punctate acute infarcts throughout both cerebral hemispheres which appeared to be embolic. A transesophageal echo done on 2/20 showed small PFO with no thrombus.  Completed stroke work-up.  Currently she is on statin, aspirin and Plavix ( on hold now for  PEG  procedure/these medications should be resumed after PEG placement) ?Neurology recommended 30-day cardiac event monitoring as an outpatient( sent message to cardiology for arrangement). ?Outpatient follow-up with neurology recommended ?  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  MRI of the brain did not show any intracranial mass or abnormal enhancement to explain patient's ptosis, ophthalmoplegia.  MRI of the orbits symmetric extraocular muscles, no abnormal enhancement of the optic nerve sheath.  Neurology recommended Plex. ?Per neurology, had catheter placed 3/1, started Plex.  Done on 3/1, 3/3, 3/4, 3/7 ,3/9 , neurology were following. Session 5/5 ?Neurology recommends EMG/nerve function test as an outpatient, outpatient neurology follow-up. ?  ?Hypotension ?Currently blood pressure soft,on midodrine ?  ?Ptosis of both eyelids ?Persistent problem.Thought to be from GBS ?  ?Malnutrition of moderate degree ?Hospital course remarkable for dysphagia.  She has a core track in place. Dietitian following. speech therapy following her.  There was concern that she might need PEG for long-term nutrition so IR consulted, planning for PEG placement later this week.   ?  ?Pressure injury of skin ?Pressure Injury 09/24/21 Sacrum Right;Left;Medial Stage 2 -  Partial thickness loss of dermis presenting as a shallow open injury with a red, pink wound bed without slough. (Active)  ?09/24/21 1600  ?Location: Sacrum  ?Location Orientation: Right;Left;Medial  ?Staging: Stage 2 -  Partial thickness loss of dermis presenting as a shallow open injury with a red, pink wound bed without slough.  ?Wound Description (Comments):   ?Present on Admission:   ?  ?  ?Wound care was following ?  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  Wound looks good ?  ?Acute encephalopathy ?Encephalopathy has improved.  Currently alert and oriented ?  ?ESRD on hemodialysis ([place]) ?Nephrology following for dialysis.She has an AV fistula on the right upper extremity.S/P  revision of fistula on 3/13.  Currently being dialyzed through left IJ catheter ?  ?Hyperglycemia due to type 2 diabetes mellitus ([place]) ?continue glargine and sliding scale.  .  Monitor blood sugars. continue current regimen ?  ?Anemia of chronic disease ?Secondary to chronic kidney disease.  Monitor Hb ?  ?  ? ? ? ?Discharge Diagnoses:  ?Principal Problem: ?  Cerebral embolism with cerebral infarction ?Active Problems: ?  GBS (Guillain-Barre syndrome) ([facility]) ?  Anemia of chronic disease ?  Hyperglycemia due to type 2 diabetes mellitus ([place]) ?  ESRD on hemodialysis ([place]) ?  Acute encephalopathy ?  Osteomyelitis ([place]) ?  Pressure injury of skin ?  PFO (patent foramen ovale) ?  Malnutrition of moderate degree ?  Ptosis of both eyelids ?  Hypotension ? ? ? ?Discharge Instructions ? ?Discharge Instructions   ? ? Ambulatory referral to Neurology   Complete by: As directed ?  ? Follow up with Dr. Leonie Man at [facility] in 4 weeks. Too complicated for RN to follow. Thanks.  ? Diet general   Complete by: As directed ?  ? Dysphagia 2 diet, tube feeding  ? Discharge instructions   Complete by: As directed ?  ? 1)Please take your medications as instructed ?2)Follow up with vascular surgery, neurology as an outpatient ?3)Start taking aspirin 81 mg and Plavix 75 mg after PEG placement  ? Discharge wound care:   Complete by: As directed ?  ? As per wound care  ? Increase activity slowly   Complete by: As directed ?  ? ?  ? ?Allergies as of 10/25/2021   ? ?   Reactions  ? Morphine Rash, Other (See Comments)  ? Peanut-containing Drug Products Anaphylaxis, Hives  ? Penicillins Rash  ? Rash in 2008.  Tolerated cefazolin in 2020  ? Chocolate Hives   ? ?  ? ?  ?Medication List  ?  ? ?STOP taking these medications   ? ?ceFAZolin  IVPB ?Commonly known as: ANCEF ?  ?losartan 50 MG tablet ?Commonly known as: COZAAR ?  ? ?  ? ?TAKE these medications   ? ?Accu-Chek FastClix Lancets Misc ?Use as instructed to check blood sugar up to TID. E11.22 ?What changed:  ?how much to take ?how to take this ?when to take this ?  ?Accu-Chek Guide Me w/Device Kit ?1 kit by Does not apply route 3 (three) times daily. Use to check BG at home up to 3 times daily. E11.22 ?  ?Accu-Chek Guide test strip ?Generic drug: glucose blood ?Use as instructed to check blood sugar up to TID. E11.22 ?What changed:  ?how much to take ?how to take this ?when to take this ?  ?atorvastatin 40 MG tablet ?Commonly known as: LIPITOR ?Place 1 tablet (40 mg total) into feeding tube daily. ?Start taking on: October 26, 2021 ?  ?calcitRIOL 0.25 MCG capsule ?Commonly known as: ROCALTROL ?Take 0.25 mcg by mouth 2 (two) times daily. ?  ?cinacalcet 90 MG tablet ?Commonly known as: SENSIPAR ?Take 90 mg by mouth daily. ?  ?Dexcom G6 Receiver Devi ?1 Device by Does not apply route as directed. ?  ?Dexcom G6 Sensor Misc ?1  Device by Does not apply route as directed. ?  ?Dexcom G6 Transmitter Misc ?1 Device by Does not apply route as directed. ?  ?diphenhydramine-acetaminophen 25-500 MG Tabs tablet ?Commonly known as: TYLENOL PM ?Take 1 tablet by mouth at bedtime as needed (sleep). ?  ?dorzolamide-timolol 22.3-6.8 MG/ML ophthalmic solution ?Commonly known as: COSOPT ?Place 1 drop into the left eye 2 (two) times daily. ?  ?feeding supplement (NEPRO CARB STEADY) Liqd ?Place 960 mLs into feeding tube daily. ?  ?feeding supplement (PROSource TF) liquid ?Place 45 mLs into feeding tube 2 (two) times daily. ?  ?nutrition supplement (JUVEN) Pack ?Place 1 packet into feeding tube 2 (two) times daily between meals. ?  ?free water Soln ?Place 30 mLs into feeding tube every 4 (four) hours. ?  ?HAIR/SKIN/NAILS PO ?Take 1 capsule by  mouth daily. ?  ?heparin 5000 UNIT/ML injection ?Inject 1 mL (5,000 Units total) into the skin every 8 (eight) hours. ?Start taking on: October 28, 2021 ?  ?HYDROmorphone 1 MG/ML injection ?Commonly known a

## 2021-10-25 NOTE — Progress Notes (Signed)
Subjective: Seen in room, for dialysis today aware today is Wednesday, complaints some postop right arm pain but improving ? ?Objective ?Vital signs in last 24 hours: ?Vitals:  ? 10/24/21 1958 10/25/21 0508 10/25/21 0512 10/25/21 0741  ?BP: (!) 91/55 (!) 91/36 (!) 94/46 (!) 98/35  ?Pulse: 92 95  94  ?Resp: 16 17  14   ?Temp: 97.7 ?F (36.5 ?C) 98.8 ?F (37.1 ?C)  98.4 ?F (36.9 ?C)  ?TempSrc: Axillary Axillary  Oral  ?SpO2: 100% 100%  100%  ?Weight:  64.5 kg    ?Height:      ? ?Weight change: 1.1 kg ? ?Physical Exam: ?General: Obese chronically ill female NAD, facial edema stable ?Heart: RRR no MRG ?Lungs: CTA nonlabored breathing ?Abdomen: Obese, NTND, NG tube feeding in place ?Extremities: Decreasing right arm swelling, no lower extremity edema, R AKA ?Dialysis Access: R  U Arm AVF+ bruit /RUA AVF incision clean and dry.  Left IJTCath dressing dry clear ?  ?  ?Dialysis Orders: ?[facility] (off schedule in hospital M WF) ?3h 62min   400/ 600   59.5kg   2/2 bath  UFP 2   RU AV fistula   ?Hep 4000 units IV TIW ?Mircera 200 mcg IV every 2 weeks - last 1/26 ?Ancef 2g 3 times weekly HD ?Covid neg 2/3, hep B SAg neg 2/4 ?  ?  ?Problem/Plan ?Embolic stroke with AMS -neuro following, acute embolic CVA /   TEE showed small PFO and no clot. Blood Cx 2/4 negative. S/p course of IV abx.  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 Getting PLEX QOD. 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  ?ESRD - OP TTS schedule - Currently off op hd schedule. Having HD on MWF, PLEX on T,TH,S. HD today 10/25/2021.  ?Ulcerated aneurysmal area R UA AVF thinning skin: Status post VVS  Dr Carlis Abbott 3/13 revision and left IJ tunneled catheter, noted he advised today plan to use IJ PermCath for 1 month letting arm heal ?Facial edema- AM of 10/15/21--> ? Delayed  reaction to PLEX with FFP vs SVC-syndrome mar reviewed, no new meds. Received Solumedrol/ benadryl/ famotidine, R EJ catheter removed. S/p L IJ trialysis HD cath in IR 3/6 by Dr. Vernard Gambles. ,  Right arm swelling improved daily ?HTN/volume - noted episodes of hypotension during HD,  improved with fluid /midodrine with HD. Below prior EDW, will need to be lowered on d/c. 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 Trula Slade VVS following.  To go to CIR at some point.  ?Sepsis/ shock - resolved  ?Anemia of CKD- last Hgb stable 9.2 continue Aranesp 259mcg q Friday. Last dose 10/20/2021. Transfuse as needed.   ?MBD: CorrCa high, Phos ok. Continue home sensipar/binders, hold VDRA for now.   ?Severe protein calorie malnutrition: NG tube in place with Nepro TF's + supplements. ?DMT2 - per PMD ? ?Ernest Haber, PA-C ?[facility] ?Beeper [phone] ?10/25/2021,9:17 AM ? LOS: 40 days  ? ?Labs: ?Basic Metabolic Panel: ?Recent Labs  ?Lab 10/21/21 ?1259 10/23/21 ?2800 10/25/21 ?0215  ?NA 136 136 133*  ?K 3.6 3.8 3.2*  ?CL 98 95* 96*  ?CO2 26 29 26   ?GLUCOSE 83 106* 154*  ?BUN 46* 61* 53*  ?CREATININE 4.42* 5.76* 3.90*  ?CALCIUM 10.2 10.4* 9.2  ? ?Liver  Function Tests: ?No results for input(s): AST, ALT, ALKPHOS, BILITOT, PROT, ALBUMIN in the last 168 hours. ?No results for input(s): LIPASE, AMYLASE in the last 168 hours. ?No results for input(s): AMMONIA in the last 168 hours. ?CBC: ?Recent Labs  ?Lab 10/20/21 ?0345 10/23/21 ?5597 10/25/21 ?0215  ?WBC 11.9* 9.3 13.1*  ?NEUTROABS 7.5  --  8.6*  ?HGB 8.8* 9.2* 7.6*  ?HCT 30.6* 31.6* 25.6*  ?MCV 97.5 95.2 96.6  ?PLT 268 296 272  ? ?Cardiac Enzymes: ?No results for input(s): CKTOTAL, CKMB, CKMBINDEX, TROPONINI in the last 168 hours. ?CBG: ?Recent Labs  ?Lab 10/24/21 ?1744 10/24/21 ?2012 10/24/21 ?2323 10/25/21 ?0404 10/25/21 ?0845  ?GLUCAP 144* 139* 154* 136* 164*  ? ? ?Studies/Results: ?DG Chest Port 1  View ? ?Result Date: 10/23/2021 ?CLINICAL DATA:  Postop placement of left internal jugular dialysis catheter. EXAM: PORTABLE CHEST 1 VIEW COMPARISON:  10/20/2021 FINDINGS: Left internal jugular dialysis catheter terminates at the superior caval/atrial junction or high right atrium. Feeding tube extends beyond the inferior aspect of the film. Midline trachea. Normal heart size for level of inspiration. No pleural effusion or pneumothorax. Suspect pulmonary venous congestion, increased. Developing left base airspace disease. Numerous leads and wires project over the chest. IMPRESSION: No pneumothorax or other acute complication. Developing pulmonary venous congestion, increased. Left base airspace disease, most likely atelectasis. Electronically Signed   By: Abigail Miyamoto M.D.   On: 10/23/2021 14:06   ?Medications: ? ? atorvastatin  40 mg Per Tube Daily  ? Chlorhexidine Gluconate Cloth  6 each Topical Q0600  ? cinacalcet  90 mg Oral Q supper  ? darbepoetin (ARANESP) injection - DIALYSIS  200 mcg Intravenous Q Fri-HD  ? dorzolamide-timolol  1 drop Left Eye BID  ? feeding supplement (NEPRO CARB STEADY)  960 mL Per Tube Q24H  ? feeding supplement (PROSource TF)  45 mL Per Tube BID  ? free water  30 mL Per Tube Q4H  ? heparin injection (subcutaneous)  5,000 Units Subcutaneous Q8H  ? insulin aspart  0-6 Units Subcutaneous Q4H  ? insulin glargine-yfgn  5 Units Subcutaneous BID  ? leptospermum manuka honey  1 application. Topical Daily  ? mouth rinse  15 mL Mouth Rinse BID  ? midodrine  5 mg Oral TID WC  ? multivitamin  1 tablet Per Tube QHS  ? nutrition supplement (JUVEN)  1 packet Per Tube BID BM  ? nystatin  5 mL Oral QID  ? pantoprazole (PROTONIX) IV  40 mg Intravenous Q24H  ? sennosides  5 mL Per Tube BID  ? sevelamer carbonate  1.6 g Per Tube TID WC  ? thiamine  100 mg Per Tube Daily  ? ? ? ? ?

## 2021-10-25 NOTE — Progress Notes (Signed)
Physical Therapy Treatment ?Patient Details ?Name: [name] ?MRN: [id number] ?DOB: 10-07-1980 ?Today's Date: 10/25/2021 ? ? ?History of Present Illness Pt is a 41 y.o. female admitted 09/15/21 post-HD session with concern for AMS when pt stopped speaking to staff. 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 Pt with increased somnolence 2/6; noted to be more lethargic in HD 2/7, 2/9, 2/11. Head CT 2/10 negative for acute injury; chronic small vessel disease and infarcts. MRI 2/10 with scattered punctate acute infarcts bilateral cerebral hemispheres (may be embolic), potential subacute infarct L corona radiata. Pt with persistent bilateral ptosis (pt reports ongoing ~1 month). MRI orbits 2/25 with evolution of infarcts seen on prior study; no acute infarct; chronic R retinal detachment. Per neurology 2/28, high suspicion for miller fischer GBS; to proceed with PLEX treatment. S/p R IJ temporary HD cath placement 3/1. 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 Pt with worsening face/throat swelling; s/p R IJ removal 3/5. S/p L IJ HD cath 3/6. PMH includes ESRD (HD TTS), DM2, HTN, neuropathy, R eye blindness (2008). ? ?  ?PT Comments  ? ? Pt motivated to participate this session despite max c/o pain "everywhere" and in RUE s/p fistula revision. Pt able to come to sitting EOB with max assist +2 and use of bed pads, secondary to pain. During standing trials pt unable to generate adequate power through LLE to power up and clear hips from EOB with total a +2 and stedy. She was able to weight shift anteriorly with max cues and pull on stedy rail with LUE, unable to pull with RUE secondary to pain. Able to tolerate x3 trials of standing. Pt remains limited by generalized weakness, decreased activity tolerance, poor balance strategies/postural reactions and impaired cognition. Current plan remains appropriate to address deficits and maximize functional independence and decrease caregiver burden.Pt continues to  benefit from skilled PT services to progress toward functional mobility goals.  ? ?  ?Recommendations for follow up therapy are one component of a multi-disciplinary discharge planning process, led by the attending physician.  Recommendations may be updated based on patient status, additional functional criteria and insurance authorization. ? ?Follow Up Recommendations ? Acute inpatient rehab (3hours/day) ?  ?  ?Assistance Recommended at Discharge Frequent or constant Supervision/Assistance  ?Patient can return home with the following Assistance with cooking/housework;Assistance with feeding;Direct supervision/assist for medications management;Direct supervision/assist for financial management;Assist for transportation;Help with stairs or ramp for entrance;A lot of help with bathing/dressing/bathroom;A lot of help with walking and/or transfers ?  ?Equipment Recommendations ? BSC/3in1;Wheelchair (measurements PT);Wheelchair cushion (measurements PT);Hospital bed;Other (comment) (lift equipment)  ?  ?Recommendations for Other Services Rehab consult ? ? ?  ?Precautions / Restrictions Precautions ?Precautions: Fall;Other (comment) ?Precaution Comments: R BKA now R AKA; bilateral ptosis with blindness; bowel incontinence; PLEX treatment every other day (cannot mobilize while running per neuro MD) ?Required Braces or Orthoses: Other Brace ?Other Brace: RLE limb guard ?Restrictions ?Weight Bearing Restrictions: Yes ?RUE Weight Bearing: Weight bearing as tolerated ?RLE Weight Bearing: Non weight bearing  ?  ? ?Mobility ? Bed Mobility ?Overal bed mobility: Needs Assistance ?Bed Mobility: Rolling ?Rolling: Mod assist, +2 for physical assistance ?Sidelying to sit: +2 for physical assistance, +2 for safety/equipment, Max assist ?  ?Sit to supine: Min assist, +2 for physical assistance ?  ?General bed mobility comments: max a +2 to come to sitting EOB, pt unable to generate force through BUE to come to sitting, max c/o pain  "everywhere" during mobility ?  ? ?Transfers ?  Overall transfer level: Needs assistance ?Equipment used: Ambulation equipment used (stedy) ?Transfers: Sit to/from Stand ?Sit to Stand: Total assist, +2 physical assistance, From elevated surface ?  ?  ?  ?  ?  ?General transfer comment: pt unable to generate adequate power through LLE to clear hips from EOB with total a +2, able to weight shift anteriorly with max cues and pull on stedy rail with LUE, unable with RUE secondary to pain ?  ? ?Ambulation/Gait ?  ?  ?  ?  ?  ?  ?  ?  ? ? ?Stairs ?  ?  ?  ?  ?  ? ? ?Wheelchair Mobility ?  ? ?Modified Rankin (Stroke Patients Only) ?  ? ? ?  ?Balance Overall balance assessment: Needs assistance ?Sitting-balance support: Feet supported, Bilateral upper extremity supported, Single extremity supported ?Sitting balance-Leahy Scale: Poor ?Sitting balance - Comments: Demonstrates significant improvement in tolerance to sitting; initially requiring modA to maintain midline, but progressed to min guard in chair with pt able to use UEs to shift weight while in chair with pillow under right UE,  reliant on UE support ?  ?  ?  ?  ?  ?  ?  ?  ?  ?  ?  ?  ?  ?  ?  ?  ? ?  ?Cognition Arousal/Alertness: Awake/alert ?Behavior During Therapy: Flat affect ?Overall Cognitive Status: Difficult to assess ?Area of Impairment: Attention, Following commands, Safety/judgement, Awareness, Problem solving ?  ?  ?  ?  ?  ?  ?  ?  ?Orientation Level: Place, Time, Situation ?Current Attention Level: Sustained ?Memory: Decreased recall of precautions, Decreased short-term memory ?Following Commands: Follows one step commands with increased time ?Safety/Judgement: Decreased awareness of safety, Decreased awareness of deficits ?Awareness: Emergent ?Problem Solving: Slow processing, Difficulty sequencing, Requires verbal cues, Requires tactile cues ?General Comments: pt motivated to participate, demonstrates improving awareness and ability to communicate  needs; improved effort with activity ?  ?  ? ?  ?Exercises   ? ?  ?General Comments   ?  ?  ? ?Pertinent Vitals/Pain Pain Assessment ?Pain Assessment: Faces ?Faces Pain Scale: Hurts whole lot ?Pain Location: R arm, buttocks ?Pain Descriptors / Indicators: Discomfort, Grimacing, Aching, Moaning, Restless ?Pain Intervention(s): Limited activity within patient's tolerance, Monitored during session, Repositioned  ? ? ?Home Living   ?  ?  ?  ?  ?  ?  ?  ?  ?  ?   ?  ?Prior Function    ?  ?  ?   ? ?PT Goals (current goals can now be found in the care plan section) Acute Rehab PT Goals ?Patient Stated Goal: to get to rehab ?PT Goal Formulation: With patient ?Time For Goal Achievement: 11/02/21 ? ?  ?Frequency ? ? ? Min 4X/week ? ? ? ?  ?PT Plan Current plan remains appropriate  ? ? ?Co-evaluation   ?  ?  ?  ?  ? ?  ?AM-PAC PT "6 Clicks" Mobility   ?Outcome Measure ? Help needed turning from your back to your side while in a flat bed without using bedrails?: A Lot ?Help needed moving from lying on your back to sitting on the side of a flat bed without using bedrails?: A Lot ?Help needed moving to and from a bed to a chair (including a wheelchair)?: Total ?Help needed standing up from a chair using your arms (e.g., wheelchair or bedside chair)?: Total ?Help needed to walk in hospital  room?: Total ?Help needed climbing 3-5 steps with a railing? : Total ?6 Click Score: 8 ? ?  ?End of Session   ?Activity Tolerance: Patient tolerated treatment well;Patient limited by pain ?Patient left: in bed;with call bell/phone within reach;with nursing/sitter in room (OT came in after pt.) ?Nurse Communication: Mobility status ?PT Visit Diagnosis: Other abnormalities of gait and mobility (R26.89);Muscle weakness (generalized) (M62.81);Difficulty in walking, not elsewhere classified (R26.2);Other symptoms and signs involving the nervous system (R29.898) ?Pain - Right/Left: Right ?Pain - part of body: Leg ?  ? ? ?Time: 8329-1916 ?PT Time  Calculation (min) (ACUTE ONLY): 18 min ? ?Charges:  $Therapeutic Activity: 8-22 mins          ?          ? ?[name]. PTA ?Acute Rehabilitation Services ?Office: [phone] ? ? ? ?[name] ?10/25/2021, 11:35 AM ? ?

## 2021-10-25 NOTE — Progress Notes (Signed)
[facility]  ? ?Subjective  -always seems to be in pain. ? ? ?Objective ?(!) 98/35 ?94 ?98.4 ?F (36.9 ?C) (Oral) ?14 ?100% ?No intake or output data in the 24 hours ending 10/25/21 0902 ? ?Right upper arm incision from AV fistula revision clean dry and intact ?Right arm AV fistula with good thrill ?Left IJ tunneled catheter no hematoma ? ?Laboratory ?Lab Results: ?Recent Labs  ?  10/23/21 ?2330 10/25/21 ?0215  ?WBC 9.3 13.1*  ?HGB 9.2* 7.6*  ?HCT 31.6* 25.6*  ?PLT 296 272  ? ?BMET ?Recent Labs  ?  10/23/21 ?0747 10/25/21 ?0215  ?NA 136 133*  ?K 3.8 3.2*  ?CL 95* 96*  ?CO2 29 26  ?GLUCOSE 106* 154*  ?BUN 61* 53*  ?CREATININE 5.76* 3.90*  ?CALCIUM 10.4* 9.2  ? ? ?COAG ?Lab Results  ?Component Value Date  ? INR 1.2 08/10/2021  ? INR 1.1 06/30/2020  ? ?No results found for: PTT ? ?Assessment/Planning: ? ?41 year old female now postop day 2 status post revision of right upper arm AV fistula with aneurysm plication and excision of ulcerated skin.  She also had conversion to a left IJ tunnel catheter.  Continue to use tunneled catheter for immediate dialysis needs.  Would allow her arm to heal for 1 month.  Discussed with nursing to place a dry clean dressing on her arm if there is any drainage given she has significant edema.  Call vascular if any further questions or concerns.  Has follow-up arranged as an outpatient in early April. ? ?Marty Heck ?10/25/2021 ?9:02 AM ?-- ? ? ?

## 2021-10-25 NOTE — Progress Notes (Signed)
Patient ID: [name], female   DOB: 1980-12-05, 41 y.o.   MRN: [id number] ? ?Report called to [facility] and given to Debby Full RN. ? ? ?Haydee Salter, RN ? ?

## 2021-10-25 NOTE — TOC Transition Note (Addendum)
Transition of Care (TOC) - CM/SW Discharge Note ? ? ?Patient Details  ?Name: [name] ?MRN: [id number] ?Date of Birth: 1981-05-19 ? ?Transition of Care [facility]) CM/SW Contact:  ?Sharin Mons, RN ?Phone Number: ?10/25/2021, 11:41 AM ? ? ?Clinical Narrative:    ?Patient will DC to: [facility] ?Anticipated DC date:10/25/2021 ?Family notified: Yes, sister [name], husband @ bedside ?Transport by: staff ? ? Pt states agreeable to [facility]  after speaking with Anderson Malta / [facility]  admission liaison([phone]). ? ?Per MD patient ready for DC today. Pt with approval for LTAC. Pt will d/c to [facility] after received back from HD session today. RN, patient, and patient's sister [name] notified of DC. Husband @ bedside, [name]. . Pt will transition to [facility]. Pt is assigned to room 5E13. RN please call report to [phone]. ? ? RNCM will sign off for now as intervention is no longer needed. Please consult [place] again if new needs arise.  ? ? ?Final next level of care: [facility] ([facility]AC) ?Barriers to Discharge: No Barriers Identified ? ? ?Patient Goals and CMS Choice ?  ?CMS Medicare.gov Compare Post Acute Care list provided to:: Patient Represenative (must comment) ?Choice offered to / list presented to : Sibling ? ?Discharge Placement ?  ?           ?  ?  ?  ?  ? ?Discharge Plan and Services ?In-house Referral: Clinical Social Work ?  ?Post Acute Care Choice: [place]          ?   ? ?Social Determinants of Health (SDOH) Interventions ?  ? ? ?Readmission Risk Interventions ?Readmission Risk Prevention Plan 08/23/2021  ?Transportation Screening Complete  ?[place] or Home Care Consult Complete  ?Social Work Consult for [place] Planning/Counseling Complete  ?Palliative Care Screening Not Applicable  ?Medication Review [employer] photographer) Complete  ?Some recent data might be hidden  ? ? ? ? ? ?

## 2021-10-25 NOTE — Progress Notes (Signed)
Interventional Radiology Brief Note: ? ?Patient progressing toward gastrostomy tube placement after Plavix hold at the end of the week.  Per Dr. Anselm Pancoast recommendation, repeat CT Abdomen ordered to assess anatomy for interval changes given patient's multiple comorbidities. ? ?IR following.  ? ?Brynda Greathouse, MS RD PA-C ? ?  ?

## 2021-10-25 NOTE — Progress Notes (Signed)
Inpatient Rehab Admissions Coordinator:  ? ?Pt. To discharge to [facility] today. CIR will sign off.  ? ?Clemens Catholic, MS, CCC-SLP ?Rehab Admissions Coordinator  ?[phone] (celll) ?[phone] (office) ? ?

## 2021-10-25 NOTE — Progress Notes (Signed)
Received a telephone call from pt's sister,[name], who identifies herself as pt's 19, states "I just wanted to make sure that they told you that my sister's husband is not allowed up there anymore". Per [name], it is her request that pt's husband not be allowed up to hospital unit to visit patient. Charge nurse, Daune Perch, notified of request. ?

## 2021-10-25 NOTE — Progress Notes (Signed)
Contacted [facility] to make clinic aware that pt will be d/c to [facility] today. Clinic advised that [facility] staff will be following for any d/c needs. [facility] will need to provide notification to clinic regarding pt's d/c date from [facility].  ? ?Melven Sartorius ?Renal Navigator ?[phone] ?

## 2021-10-26 ENCOUNTER — Other Ambulatory Visit (HOSPITAL_COMMUNITY): Payer: Medicare Other

## 2021-10-26 LAB — COMPREHENSIVE METABOLIC PANEL
ALT: 14 U/L (ref 0–44)
AST: 20 U/L (ref 15–41)
Albumin: 2.8 g/dL — ABNORMAL LOW (ref 3.5–5.0)
Alkaline Phosphatase: 59 U/L (ref 38–126)
Anion gap: 10 (ref 5–15)
BUN: 21 mg/dL — ABNORMAL HIGH (ref 6–20)
CO2: 28 mmol/L (ref 22–32)
Calcium: 9.3 mg/dL (ref 8.9–10.3)
Chloride: 96 mmol/L — ABNORMAL LOW (ref 98–111)
Creatinine, Ser: 2.42 mg/dL — ABNORMAL HIGH (ref 0.44–1.00)
GFR, Estimated: 25 mL/min — ABNORMAL LOW (ref >60)
Glucose, Bld: 62 mg/dL — ABNORMAL LOW (ref 70–99)
Potassium: 3.6 mmol/L (ref 3.5–5.1)
Sodium: 134 mmol/L — ABNORMAL LOW (ref 135–145)
Total Bilirubin: 0.6 mg/dL (ref 0.3–1.2)
Total Protein: 6.3 g/dL — ABNORMAL LOW (ref 6.5–8.1)

## 2021-10-26 LAB — CBC WITH DIFFERENTIAL/PLATELET
Abs Immature Granulocytes: 0.04 10*3/uL (ref 0.00–0.07)
Basophils Absolute: 0.1 10*3/uL (ref 0.0–0.1)
Basophils Relative: 1 %
Eosinophils Absolute: 1.3 10*3/uL — ABNORMAL HIGH (ref 0.0–0.5)
Eosinophils Relative: 11 %
HCT: 24.6 % — ABNORMAL LOW (ref 36.0–46.0)
Hemoglobin: 7.1 g/dL — ABNORMAL LOW (ref 12.0–15.0)
Immature Granulocytes: 0 %
Lymphocytes Relative: 19 %
Lymphs Abs: 2.4 10*3/uL (ref 0.7–4.0)
MCH: 27.7 pg (ref 26.0–34.0)
MCHC: 28.9 g/dL — ABNORMAL LOW (ref 30.0–36.0)
MCV: 96.1 fL (ref 80.0–100.0)
Monocytes Absolute: 0.8 10*3/uL (ref 0.1–1.0)
Monocytes Relative: 6 %
Neutro Abs: 8 10*3/uL — ABNORMAL HIGH (ref 1.7–7.7)
Neutrophils Relative %: 63 %
Platelets: 264 10*3/uL (ref 150–400)
RBC: 2.56 MIL/uL — ABNORMAL LOW (ref 3.87–5.11)
RDW: 19.1 % — ABNORMAL HIGH (ref 11.5–15.5)
WBC: 12.6 10*3/uL — ABNORMAL HIGH (ref 4.0–10.5)
nRBC: 0.2 % (ref 0.0–0.2)

## 2021-10-26 NOTE — Progress Notes (Signed)
Pt lost her IV access and could not give Dilaudid 0.5mg . Medication was wasted with Celso Sickle nurse as a witness. ?

## 2021-10-26 NOTE — Consult Note (Signed)
? ?Patient Status: [facility] - In-pt ? ?Assessment and Plan: ?Patient in need of gastrostomy tube for dysphagia and long-term care.  ?Patient with history of ESRD on HD known to IR from recent GBS requiring plasmapheresis via trialysis catheter.   ?Patient currently getting tube feeds via NGT.  ?She is agreeable to procedure.  ?Verbal consent obtained and witnessed due to opthalmoparesis. ?She does continue to have facial swelling with limited ability to manipulate her jaw/mouth which may affect the ability for her to undergo procedure. ? ?Risks and benefits discussed with the patient including, but not limited to bleeding, infection, vascular injury, pneumothorax which may require chest tube placement, air embolism or even death ? ?All of the patient's questions were answered, patient is agreeable to proceed. ?Consent signed and in chart. ? ?______________________________________________________________________ ? ? ?History of Present Illness: ?[name] is a 41 y.o. female with history of ESRD on HD, DM2, HTN, sent by dialysis center to ED for AMS after dialysis session 09/15/21.  She was found to have a right foot infection and has now undergone R BKA 09/17/21 with revision to AKA 10/11/21.  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  R EJ temp catheter placed 10/11/21 by Dr. Serafina Royals for PLEX and replaced 10/16/21 by Dr. Vernard Gambles due to facial swelling.  Work-up for gastrostomy tube placement was initiated as an inpatient and patient was holding her aspirin and Plavix. She has now transferred to [place].  Discussed with [facility] PA, Priya.  Patient will continue to need gastrostomy tube and has remained on DAPT hold.  ? ?CT Abdomen obtained and reviewed by Dr. Anselm Pancoast who agrees patient remains a candidate for percutaneous gastrostomy tube placement.  ? ?Allergies and medications reviewed.  ? ?Review of Systems: A 12 point ROS discussed and  pertinent positives are indicated in the HPI above.  All other systems are negative. ? ?Review of Systems  ?Constitutional:  Positive for fatigue. Negative for fever.  ?HENT:  Positive for facial swelling.   ?Respiratory:  Negative for cough and shortness of breath.   ?Cardiovascular:  Negative for chest pain.  ?Gastrointestinal:  Negative for abdominal pain, constipation, diarrhea and nausea.  ?Musculoskeletal:  Negative for back pain.  ?Psychiatric/Behavioral:  Negative for behavioral problems and confusion.   ? ?Vital Signs: ?LMP 09/27/2021 (Approximate) Comment: Neg Preg Test 08/10/21. Has had a stroke and believed to have since been in the hospital .  [place] ? ?Physical Exam ?Vitals and nursing note reviewed.  ?Constitutional:   ?   General: She is not in acute distress. ?   Appearance: Normal appearance. She is ill-appearing.  ?HENT:  ?   Nose:  ?   Comments: NGT in place ?   Mouth/Throat:  ?   Mouth: Mucous membranes are dry.  ?   Pharynx: Oropharynx is clear.  ?Eyes:  ?   Comments: R eye swollen shut, difficulty opening left eye  ?Neck:  ?   Comments: R sided swelling, slightly tender ?Cardiovascular:  ?   Rate and Rhythm: Normal rate.  ?Pulmonary:  ?   Effort: Pulmonary effort is normal.  ?Abdominal:  ?   General: Abdomen is flat.  ?   Palpations: Abdomen is soft.  ?Musculoskeletal:  ?   Cervical back: Normal range of motion.  ?Neurological:  ?   General: No focal deficit present.  ?   Mental Status: She is alert and oriented to person, place, and time. Mental status is at baseline.  ? ? ? ?  Imaging reviewed.  ? ?Labs: ? ?COAGS: ?Recent Labs  ?  08/10/21 ?2212  ?INR 1.2  ?APTT 28  ? ? ? ?BMP: ?Recent Labs  ?  10/21/21 ?1259 10/23/21 ?0747 10/25/21 ?0215 10/26/21 ?8867  ?NA 136 136 133* 134*  ?K 3.6 3.8 3.2* 3.6  ?CL 98 95* 96* 96*  ?CO2 26 29 26 28   ?GLUCOSE 83 106* 154* 62*  ?BUN 46* 61* 53* 21*  ?CALCIUM 10.2 10.4* 9.2 9.3  ?CREATININE 4.42* 5.76* 3.90* 2.42*  ?GFRNONAA 12* 9* 14* 25*   ? ? ? ? ? ? ?Electronically Signed: ?Docia Barrier, PA ?10/26/2021, 4:55 PM ? ? ?I spent a total of 15 minutes in face to face in clinical consultation, greater than 50% of which was counseling/coordinating care for dysphagia, long-term care. ? ? ?

## 2021-10-27 ENCOUNTER — Encounter (HOSPITAL_COMMUNITY): Payer: Medicare Other

## 2021-10-27 LAB — RENAL FUNCTION PANEL
Albumin: 2.6 g/dL — ABNORMAL LOW (ref 3.5–5.0)
Anion gap: 10 (ref 5–15)
BUN: 24 mg/dL — ABNORMAL HIGH (ref 6–20)
CO2: 27 mmol/L (ref 22–32)
Calcium: 9.9 mg/dL (ref 8.9–10.3)
Chloride: 95 mmol/L — ABNORMAL LOW (ref 98–111)
Creatinine, Ser: 3.48 mg/dL — ABNORMAL HIGH (ref 0.44–1.00)
GFR, Estimated: 16 mL/min — ABNORMAL LOW (ref >60)
Glucose, Bld: 92 mg/dL (ref 70–99)
Phosphorus: 3.6 mg/dL (ref 2.5–4.6)
Potassium: 3.8 mmol/L (ref 3.5–5.1)
Sodium: 132 mmol/L — ABNORMAL LOW (ref 135–145)

## 2021-10-27 LAB — CBC
HCT: 24.4 % — ABNORMAL LOW (ref 36.0–46.0)
Hemoglobin: 7.3 g/dL — ABNORMAL LOW (ref 12.0–15.0)
MCH: 28.5 pg (ref 26.0–34.0)
MCHC: 29.9 g/dL — ABNORMAL LOW (ref 30.0–36.0)
MCV: 95.3 fL (ref 80.0–100.0)
Platelets: 272 10*3/uL (ref 150–400)
RBC: 2.56 MIL/uL — ABNORMAL LOW (ref 3.87–5.11)
RDW: 19.2 % — ABNORMAL HIGH (ref 11.5–15.5)
WBC: 10.4 10*3/uL (ref 4.0–10.5)
nRBC: 0 % (ref 0.0–0.2)

## 2021-10-27 MED ORDER — VANCOMYCIN HCL IN DEXTROSE 1-5 GM/200ML-% IV SOLN
1000.0000 mg | INTRAVENOUS | Status: AC
Start: 2021-10-27 — End: 2021-10-28

## 2021-10-27 NOTE — Consult Note (Signed)
Consult note      [facility] CONSULT NOTE    Date: 10/27/2021                  Patient Name:  [name]  MRN: [id number]  DOB: 1981/03/01  Age / Sex: 41 y.o., female         PCP: [facility]                 Service Requesting Consult: Hospitalist                 Reason for Consult: Evaluation and management of ESRD            History of Present Illness: Patient is a 41 y.o. 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  She has had recent complex course at [facility].  Notes from prior admitting team reviewed.  We also reviewed notes from nephrology.  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  Patient also has a left IJ tunneled catheter and it has been advised to use the IJ PermCath for 1 month to allow the arm to heal.  Patient has also had some episodes of hypotension during hemodialysis.  She has required treatment with midodrine.  In addition she had recent right BKA on 09/17/2021 status post right AKA 10/11/2021.  Patient also has anemia of chronic kidney disease treated with Aranesp recently.  Patient unable to provide any history at this point in time.   Medications: Outpatient medications: Medications Prior to Admission  Medication Sig Dispense Refill Last Dose   Accu-Chek FastClix Lancets MISC Use as instructed to check blood sugar up to TID. E11.22 (Patient taking differently: 1 each by Other route See admin  instructions. Use as instructed to check blood sugar up to TID. E11.22) 102 each 11    atorvastatin (LIPITOR) 40 MG tablet Place 1 tablet (40 mg total) into feeding tube daily.      Biotin w/ Vitamins C & E (HAIR/SKIN/NAILS PO) Take 1 capsule by mouth daily.      Blood Glucose Monitoring Suppl (ACCU-CHEK GUIDE ME) w/Device KIT 1 kit by Does not apply route 3 (three) times daily. Use to check BG at home up to 3 times daily. E11.22 1 kit 0    calcitRIOL (ROCALTROL) 0.25 MCG capsule Take 0.25 mcg by mouth 2 (two) times daily.       cinacalcet (SENSIPAR) 90 MG tablet Take 90 mg by mouth daily.       Continuous Blood Gluc Receiver (DEXCOM G6 RECEIVER) DEVI 1 Device by Does not apply route as directed. 1 each 0    Continuous Blood Gluc Sensor (DEXCOM G6 SENSOR) MISC 1 Device by Does not apply route as directed. 9 each 3    Continuous Blood Gluc Transmit (DEXCOM G6 TRANSMITTER) MISC 1 Device by Does not apply route as directed. 1 each 3    diphenhydramine-acetaminophen (TYLENOL PM) 25-500 MG TABS tablet Take 1 tablet by mouth at bedtime as needed (sleep).      dorzolamide-timolol (COSOPT) 22.3-6.8 MG/ML  ophthalmic solution Place 1 drop into the left eye 2 (two) times daily.      glucose blood (ACCU-CHEK GUIDE) test strip Use as instructed to check blood sugar up to TID. E11.22 (Patient taking differently: 1 each by Other route See admin instructions. Use as instructed to check blood sugar up to TID. E11.22) 100 each 12    [START ON 10/28/2021] heparin 5000 UNIT/ML injection Inject 1 mL (5,000 Units total) into the skin every 8 (eight) hours. 1 mL     HYDROmorphone (DILAUDID) 1 MG/ML injection Inject 0.5 mLs (0.5 mg total) into the vein every 4 (four) hours as needed for severe pain. 1 mL 0    insulin aspart (NOVOLOG) 100 UNIT/ML injection Inject 0-6 Units into the skin every 4 (four) hours. 10 mL 11    insulin glargine-yfgn (SEMGLEE) 100 UNIT/ML injection Inject 0.05 mLs (5 Units total) into the skin 2 (two)  times daily. 10 mL 11    Insulin Pen Needle 32G X 4 MM MISC 1 Device by Does not apply route as directed. 150 each 11    lidocaine-prilocaine (EMLA) cream Apply 1 application topically as directed.       midodrine (PROAMATINE) 5 MG tablet Take 1 tablet (5 mg total) by mouth 3 (three) times daily with meals.      multivitamin (RENA-VIT) TABS tablet Take 1 tablet by mouth daily. 90 tablet 3    nutrition supplement, JUVEN, (JUVEN) PACK Place 1 packet into feeding tube 2 (two) times daily between meals.  0    Nutritional Supplements (FEEDING SUPPLEMENT, NEPRO CARB STEADY,) LIQD Place 960 mLs into feeding tube daily.  0    Nutritional Supplements (FEEDING SUPPLEMENT, PROSOURCE TF,) liquid Place 45 mLs into feeding tube 2 (two) times daily.      nystatin (MYCOSTATIN) 100000 UNIT/ML suspension Take 5 mLs (500,000 Units total) by mouth 4 (four) times daily. 60 mL 0    oxyCODONE-acetaminophen (PERCOCET) 5-325 MG tablet Take 1 tablet by mouth every 6 (six) hours as needed for severe pain. 30 tablet 0    sennosides (SENOKOT) 8.8 MG/5ML syrup Place 5 mLs into feeding tube 2 (two) times daily. 240 mL 0    sevelamer carbonate (RENVELA) 0.8 g PACK packet Take 1.6 g by mouth 3 (three) times daily with meals. 180 packet 0    thiamine 100 MG tablet Place 1 tablet (100 mg total) into feeding tube daily.      Water For Irrigation, Sterile (FREE WATER) SOLN Place 30 mLs into feeding tube every 4 (four) hours.       Current medications:   Allergies: Allergies  Allergen Reactions   Morphine Rash and Other (See Comments)   Peanut-Containing Drug Products Anaphylaxis and Hives   Penicillins Rash    Rash in 2008.  Tolerated cefazolin in 2020   Chocolate Hives      Past Medical History: Past Medical History:  Diagnosis Date   Anemia    Anxiety    Blind right eye 2008   Diabetes mellitus without complication (HCC)    Dialysis patient (HCC)    ESRD (end stage renal disease) (HCC)    Dialysis T/Th/Sa      Past Surgical History: Past Surgical History:  Procedure Laterality Date   AMPUTATION Right 08/11/2021   Procedure: TRANSMETATARSAL AMPUTATION OF RIGHT FOOT;  Surgeon: Nada Libman, MD;  Location: [facility] OR;  Service: Vascular;  Laterality: Right;   AMPUTATION Right 09/17/2021   Procedure: AMPUTATION RIGHT BELOW KNEE;  Surgeon:  Nada Libman, MD;  Location[facility] OR;  Service: Vascular;  Laterality: Right;   AMPUTATION Right 10/11/2021   Procedure: RIGHT ABOVE KNEE AMPUTATION;  Surgeon: Nada Libman, MD;  Location: [facility] OR;  Service: Vascular;  Laterality: Right;   AMPUTATION TOE Left    APPLICATION OF WOUND VAC  08/16/2021   Procedure: APPLICATION OF WOUND VAC;  Surgeon: Nada Libman, MD;  Location: [facility] OR;  Service: Vascular;;   AV FISTULA PLACEMENT     BUBBLE STUDY  10/02/2021   Procedure: BUBBLE STUDY;  Surgeon: Pricilla Riffle, MD;  Location: [facility] ENDOSCOPY;  Service: Cardiovascular;;   CESAREAN SECTION  2011   FISTULA SUPERFICIALIZATION Right 07/27/2019   Procedure: 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;  Surgeon: Chuck Hint, MD;  Location: [facility] OR;  Service: Vascular;  Laterality: Right;   INSERTION OF DIALYSIS CATHETER Left 07/27/2019   Procedure: INSERTION OF TUNNELED  DIALYSIS CATHETER;  Surgeon: Chuck Hint, MD;  Location: [facility] OR;  Service: Vascular;  Laterality: Left;   INSERTION OF DIALYSIS CATHETER Left 10/23/2021   Procedure: INSERTION OF TUNNELED PALINDROME PRECISION DIALYSIS CATHETER (23cm);  Surgeon: Cephus Shelling, MD;  Location: [facility] OR;  Service: Vascular;  Laterality: Left;   IR FLUORO GUIDE CV LINE LEFT  10/16/2021   IR FLUORO GUIDE CV LINE RIGHT  10/11/2021   IR US GUIDE VASC ACCESS LEFT  10/16/2021   IR US GUIDE VASC ACCESS RIGHT  10/11/2021   LOWER EXTREMITY ANGIOGRAPHY N/A 08/11/2021   Procedure: LOWER EXTREMITY ANGIOGRAPHY;  Surgeon: Nada Libman, MD;  Location: [facility] INVASIVE CV LAB;  Service: Cardiovascular;  Laterality: N/A;    REVISON OF ARTERIOVENOUS FISTULA Right 10/23/2021   Procedure: REVISON OF ARTERIOVENOUS FISTULA  ARM AND PLICATION;  Surgeon: Cephus Shelling, MD;  Location: [facility] OR;  Service: Vascular;  Laterality: Right;   TEE WITHOUT CARDIOVERSION N/A 10/02/2021   Procedure: TRANSESOPHAGEAL ECHOCARDIOGRAM (TEE);  Surgeon: Pricilla Riffle, MD;  Location: [facility] ENDOSCOPY;  Service: Cardiovascular;  Laterality: N/A;   TRANSMETATARSAL AMPUTATION Right 08/16/2021   Procedure: CLOSURE OF RIGHT TRANSMETATARSAL AMPUTATION;  Surgeon: Nada Libman, MD;  Location: [facility] OR;  Service: Vascular;  Laterality: Right;     Family History: Family History  Problem Relation Age of Onset   Diabetes Mother    Diabetes Father      Social History: Social History   Socioeconomic History   Marital status: Single    Spouse name: Not on file   Number of children: Not on file   Years of education: Not on file   Highest education level: Not on file  Occupational History   Not on file  Tobacco Use   Smoking status: Never   Smokeless tobacco: Never  Vaping Use   Vaping Use: Never used  Substance and Sexual Activity   Alcohol use: No   Drug use: No   Sexual activity: Not on file  Other Topics Concern   Not on file  Social History Narrative   Not on file   Social Determinants of Health   Financial Resource Strain: Not on file  Food Insecurity: Not on file  Transportation Needs: Not on file  Physical Activity: Not on file  Stress: Not on file  Social Connections: Not on file  Intimate Partner Violence: Not on file     Review of Systems: Patient unable to provide review of systems.  Vital Signs: Last menstrual period 09/27/2021.  Weight trends: There were no  vitals filed for this visit.   Physical Exam: General: Chronically ill-appearing  Head: Facial edema noted, NG tube in place  Eyes: Bilateral ptosis noted  Neck: Supple  Lungs:  Clear to auscultation, normal effort  Heart: S1S2 no rubs   Abdomen:  Soft, nontender, bowel sounds present  Extremities: Right AKA, diminutive left lower extremity  Neurologic: Arousable but not consistently following commands  Skin: No acute rash  Access: Left IJ PermCath, right upper extremity AV access    Lab results: Basic Metabolic Panel: Recent Labs  Lab 10/25/21 0215 10/26/21 0447 10/27/21 0444  NA 133* 134* 132*  K 3.2* 3.6 3.8  CL 96* 96* 95*  CO2 26 28 27   GLUCOSE 154* 62* 92  BUN 53* 21* 24*  CREATININE 3.90* 2.42* 3.48*  CALCIUM 9.2 9.3 9.9  PHOS  --   --  3.6    Liver Function Tests: Recent Labs  Lab 10/26/21 0447 10/27/21 0444  AST 20  --   ALT 14  --   ALKPHOS 59  --   BILITOT 0.6  --   PROT 6.3*  --   ALBUMIN 2.8* 2.6*   No results for input(s): LIPASE, AMYLASE in the last 168 hours. No results for input(s): AMMONIA in the last 168 hours.  CBC: Recent Labs  Lab 10/23/21 0747 10/25/21 0215 10/26/21 0447 10/27/21 0444  WBC 9.3 13.1* 12.6* 10.4  NEUTROABS  --  8.6* 8.0*  --   HGB 9.2* 7.6* 7.1* 7.3*  HCT 31.6* 25.6* 24.6* 24.4*  MCV 95.2 96.6 96.1 95.3  PLT 296 272 264 272    Cardiac Enzymes: No results for input(s): CKTOTAL, CKMB, CKMBINDEX, TROPONINI in the last 168 hours.  BNP: Invalid input(s): POCBNP  CBG: Recent Labs  Lab 10/24/21 2323 10/25/21 0404 10/25/21 0845 10/25/21 1121 10/25/21 2049  GLUCAP 154* 136* 164* 130* 77    Microbiology: Results for orders placed or performed during the hospital encounter of 09/15/21  Resp Panel by RT-PCR (Flu A&B, Covid) Nasopharyngeal Swab     Status: None   Collection Time: 09/15/21  4:11 PM   Specimen: Nasopharyngeal Swab; Nasopharyngeal(NP) swabs in vial transport medium  Result Value Ref Range Status   SARS Coronavirus 2 by RT PCR NEGATIVE NEGATIVE Final    Comment: (NOTE) SARS-CoV-2 target nucleic acids are NOT DETECTED.  The SARS-CoV-2 RNA is generally detectable in upper respiratory specimens during the acute phase of infection.  The lowest concentration of SARS-CoV-2 viral copies this assay can detect is 138 copies/mL. A negative result does not preclude SARS-Cov-2 infection and should not be used as the sole basis for treatment or other patient management decisions. A negative result may occur with  improper specimen collection/handling, submission of specimen other than nasopharyngeal swab, presence of viral mutation(s) within the areas targeted by this assay, and inadequate number of viral copies(<138 copies/mL). A negative result must be combined with clinical observations, patient history, and epidemiological information. The expected result is Negative.  Fact Sheet for Patients:  BloggerCourse.com  Fact Sheet for Healthcare Providers:  SeriousBroker.it  This test is no t yet approved or cleared by the [place] FDA and  has been authorized for detection and/or diagnosis of SARS-CoV-2 by FDA under an Emergency Use Authorization (EUA). This EUA will remain  in effect (meaning this test can be used) for the duration of the COVID-19 declaration under Section 564(b)(1) of the Act, 21 U.S.C.section 360bbb-3(b)(1), unless the authorization is terminated  or revoked sooner.  Influenza A by PCR NEGATIVE NEGATIVE Final   Influenza B by PCR NEGATIVE NEGATIVE Final    Comment: (NOTE) The Xpert Xpress SARS-CoV-2/FLU/RSV plus assay is intended as an aid in the diagnosis of influenza from Nasopharyngeal swab specimens and should not be used as a sole basis for treatment. Nasal washings and aspirates are unacceptable for Xpert Xpress SARS-CoV-2/FLU/RSV testing.  Fact Sheet for Patients: BloggerCourse.com  Fact Sheet for Healthcare Providers: SeriousBroker.it  This test is not yet approved or cleared by the [place] FDA and has been authorized for detection and/or diagnosis of SARS-CoV-2 by FDA  under an Emergency Use Authorization (EUA). This EUA will remain in effect (meaning this test can be used) for the duration of the COVID-19 declaration under Section 564(b)(1) of the Act, 21 U.S.C. section 360bbb-3(b)(1), unless the authorization is terminated or revoked.  Performed at [facility], [address], [place], [place] [address]   Culture, blood (routine x 2)     Status: None   Collection Time: 09/16/21  9:40 AM   Specimen: Peripheral; Blood  Result Value Ref Range Status   Specimen Description BLOOD RIGHT ANTECUBITAL  Final   Special Requests   Final    BOTTLES DRAWN AEROBIC AND ANAEROBIC Blood Culture adequate volume   Culture   Final    NO GROWTH 5 DAYS Performed at [facility], [address], [place], [place] [address]    Report Status 09/21/2021 FINAL  Final  Culture, blood (routine x 2)     Status: None   Collection Time: 09/16/21  9:42 AM   Specimen: Peripheral; Blood  Result Value Ref Range Status   Specimen Description BLOOD LEFT HAND  Final   Special Requests   Final    BOTTLES DRAWN AEROBIC AND ANAEROBIC Blood Culture results may not be optimal due to an inadequate volume of blood received in culture bottles   Culture   Final    NO GROWTH 5 DAYS Performed at [facility], [address] [place], [place] [address]    Report Status 09/21/2021 FINAL  Final  Surgical pcr screen     Status: None   Collection Time: 09/17/21  6:16 AM   Specimen: Nasal Mucosa; Nasal Swab  Result Value Ref Range Status   MRSA, PCR NEGATIVE NEGATIVE Final   Staphylococcus aureus NEGATIVE NEGATIVE Final    Comment: (NOTE) The Xpert SA Assay (FDA approved for NASAL specimens in patients 54 years of age and older), is one component of a comprehensive surveillance program. It is not intended to diagnose infection nor to guide or monitor treatment. Performed at [facility], [address] [place], [place] [address]   SARS CORONAVIRUS 2 (TAT 6-24  HRS) Nasopharyngeal Nasopharyngeal Swab     Status: None   Collection Time: 09/20/21  4:26 PM   Specimen: Nasopharyngeal Swab  Result Value Ref Range Status   SARS Coronavirus 2 NEGATIVE NEGATIVE Final    Comment: (NOTE) SARS-CoV-2 target nucleic acids are NOT DETECTED.  The SARS-CoV-2 RNA is generally detectable in upper and lower respiratory specimens during the acute phase of infection. Negative results do not preclude SARS-CoV-2 infection, do not rule out co-infections with other pathogens, and should not be used as the sole basis for treatment or other patient management decisions. Negative results must be combined with clinical observations, patient history, and epidemiological information. The expected result is Negative.  Fact Sheet for Patients: HairSlick.no  Fact Sheet for Healthcare Providers:  quierodirigir.com  This test is not yet approved or cleared by the [place] and  has been authorized for detection and/or diagnosis of SARS-CoV-2 by FDA under an Emergency Use Authorization (EUA). This EUA will remain  in effect (meaning this test can be used) for the duration of the COVID-19 declaration under Se ction 564(b)(1) of the Act, 21 U.S.C. section 360bbb-3(b)(1), unless the authorization is terminated or revoked sooner.  Performed at [facility], [address], [place], [place] [address]   MRSA Next Gen by PCR, Nasal     Status: None   Collection Time: 09/22/21  9:40 AM   Specimen: Peripheral; Nasal Swab  Result Value Ref Range Status   MRSA by PCR Next Gen NOT DETECTED NOT DETECTED Final    Comment: (NOTE) The GeneXpert MRSA Assay (FDA approved for NASAL specimens only), is one component of a comprehensive MRSA colonization surveillance program. It is not intended to diagnose MRSA infection nor to guide or monitor treatment for MRSA infections. Test performance is not FDA approved in patients  less than 61 years old. Performed at [facility], [address], [place], [place] [address]   Culture, blood (routine x 2)     Status: None   Collection Time: 09/22/21  3:54 PM   Specimen: BLOOD LEFT HAND  Result Value Ref Range Status   Specimen Description BLOOD LEFT HAND  Final   Special Requests   Final    BOTTLES DRAWN AEROBIC AND ANAEROBIC Blood Culture results may not be optimal due to an inadequate volume of blood received in culture bottles   Culture   Final    NO GROWTH 5 DAYS Performed at [facility], [address] [place], [place] [address]    Report Status 09/27/2021 FINAL  Final    Coagulation Studies: No results for input(s): LABPROT, INR in the last 72 hours.  Urinalysis: No results for input(s): COLORURINE, LABSPEC, PHURINE, GLUCOSEU, HGBUR, BILIRUBINUR, KETONESUR, PROTEINUR, UROBILINOGEN, NITRITE, LEUKOCYTESUR in the last 72 hours.  Invalid input(s): APPERANCEUR    Imaging: CT ABDOMEN WO CONTRAST  Result Date: 10/26/2021 CLINICAL DATA:  Dysphagia.  Gastrostomy evaluation. EXAM: CT ABDOMEN WITHOUT CONTRAST TECHNIQUE: Multidetector CT imaging of the abdomen was performed following the standard protocol without IV contrast. RADIATION DOSE REDUCTION: This exam was performed according to the departmental dose-optimization program which includes automated exposure control, adjustment of the mA and/or kV according to patient size and/or use of iterative reconstruction technique. COMPARISON:  CT AP, most recently 09/26/2021.  Chest XR, 10/23/2021. FINDINGS: Lower chest: Small volume LEFT pleural effusion with adjacent dependent consolidation, likely atelectasis. Hepatobiliary: Normal noncontrast appearance. No gallstones, gallbladder wall thickening, or biliary dilatation. Mild prominence of the LEFT hepatic lobe. Pancreas: No pancreatic ductal dilatation or surrounding inflammatory changes. Spleen: Nonenlarged. Inferior pole splenic parenchymal hypodense  lesions are better appreciated and described on recent comparison contrasted CT AP (09/26/2021). Adrenals/Urinary Tract: Adrenal glands are unremarkable. Atrophic kidneys, unchanged. Severe bilateral renal artery atherosclerosis. No discrete renal calculi, focal lesion, or hydronephrosis. Stomach/Bowel: Stomach is within normal limits. Small bore enteric feeding tube, with catheter tip pre-pyloric within the antral stomach. Imaged portions of the small bowel and colon are nondilated. Vascular/Lymphatic: No enlarged abdominallymph nodes. Severe burden of aortic, medium and small vessel calcified atherosclerosis, greater than expected for patient's age Other: Moderate body wall edema, with severe subcutaneous edema of the imaged portions of the RIGHT breast. Anterior abdominal wall subcutaneous contusions, likely injection sites. No  abdominal wall hernia. Musculoskeletal: No acute osseous findings. IMPRESSION: 1. Anatomy is amenable for percutaneous gastrostomy placement. Attention to procedural MD, caution prominence of the LEFT hepatic lobe. 2. Moderate body wall edema and severe subcutaneous edema of the incompletely imaged RIGHT breast. Correlate with physical examination. 3. Inferior pole splenic hypodensities, better appreciated on recent comparison contrast CT AP. 4. Severe burden of aortic, medium and small vessel atherosclerosis, greater than expected for patient's age, and often seen with severe diabetic angiopathy. 5.  Additional incidental, chronic and senescent findings as above. Roanna Banning, MD [facility] Radiology Electronically Signed   By: Roanna Banning M.D.   On: 10/26/2021 08:18   DG Abd Portable 1V  Result Date: 10/26/2021 CLINICAL DATA:  Nasogastric tube placement. EXAM: PORTABLE ABDOMEN - 1 VIEW COMPARISON:  October 02, 2021 FINDINGS: An enteric tube is seen with its distal tip overlying the expected region of the gastric antrum. This is  present on the prior study and unchanged in position. A new Dobbhoff feeding tube is also noted with its distal tip overlying the body of the stomach. The bowel gas pattern is normal. No radio-opaque calculi or other significant radiographic abnormality are seen. IMPRESSION: Enteric tube and Dobbhoff feeding tube positioning, as described above. Electronically Signed   By: Aram Candela M.D.   On: 10/26/2021 15:55     Assessment & Plan: Pt is a 41 y.o. 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  1.  ESRD on HD.  We will plan for hemodialysis treatment today using the left IJ dialysis catheter.  Right upper extremity fistula is to be rested for 1 month.  2.  Anemia of chronic kidney disease.  Hemoglobin low at 7.3.  Start the patient on Retacrit 10,000 units IV with dialysis on MWF treatment days.  3.  Secondary hyperparathyroidism.  Most recent serum phosphorus was acceptable at 3.6.  We will continue to monitor bone metabolism parameters.  4.  Hypotension.  Continue use of midodrine for blood pressure support particularly during dialysis treatments.

## 2021-10-28 ENCOUNTER — Encounter (HOSPITAL_BASED_OUTPATIENT_CLINIC_OR_DEPARTMENT_OTHER): Payer: Medicare Other

## 2021-10-28 DIAGNOSIS — R609 Edema, unspecified: Secondary | ICD-10-CM | POA: Diagnosis not present

## 2021-10-28 NOTE — Progress Notes (Signed)
VASCULAR LAB ? ? ? ?Right upper extremity venous duplex has been performed. ? ?See CV proc for preliminary results. ? ?Gave verbal report to Dr. Harlow Mares ? ?[name], RVT ?10/28/2021, 5:11 PM ? ?

## 2021-10-30 ENCOUNTER — Other Ambulatory Visit (HOSPITAL_COMMUNITY): Payer: Medicare Other

## 2021-10-30 HISTORY — PX: IR GASTROSTOMY TUBE MOD SED: IMG625

## 2021-10-30 LAB — RENAL FUNCTION PANEL
Albumin: 2.5 g/dL — ABNORMAL LOW (ref 3.5–5.0)
Anion gap: 10 (ref 5–15)
BUN: 32 mg/dL — ABNORMAL HIGH (ref 6–20)
CO2: 30 mmol/L (ref 22–32)
Calcium: 11.3 mg/dL — ABNORMAL HIGH (ref 8.9–10.3)
Chloride: 92 mmol/L — ABNORMAL LOW (ref 98–111)
Creatinine, Ser: 4.62 mg/dL — ABNORMAL HIGH (ref 0.44–1.00)
GFR, Estimated: 12 mL/min — ABNORMAL LOW (ref >60)
Glucose, Bld: 108 mg/dL — ABNORMAL HIGH (ref 70–99)
Phosphorus: 4.8 mg/dL — ABNORMAL HIGH (ref 2.5–4.6)
Potassium: 4.1 mmol/L (ref 3.5–5.1)
Sodium: 132 mmol/L — ABNORMAL LOW (ref 135–145)

## 2021-10-30 LAB — CBC
HCT: 27.9 % — ABNORMAL LOW (ref 36.0–46.0)
Hemoglobin: 8.4 g/dL — ABNORMAL LOW (ref 12.0–15.0)
MCH: 28.1 pg (ref 26.0–34.0)
MCHC: 30.1 g/dL (ref 30.0–36.0)
MCV: 93.3 fL (ref 80.0–100.0)
Platelets: 332 10*3/uL (ref 150–400)
RBC: 2.99 MIL/uL — ABNORMAL LOW (ref 3.87–5.11)
RDW: 17.9 % — ABNORMAL HIGH (ref 11.5–15.5)
WBC: 9.7 10*3/uL (ref 4.0–10.5)
nRBC: 0 % (ref 0.0–0.2)

## 2021-10-30 MED ORDER — MIDAZOLAM HCL 2 MG/2ML IJ SOLN
INTRAMUSCULAR | Status: AC | PRN
  Administered 2021-10-30 (×2): 1 mg via INTRAVENOUS

## 2021-10-30 MED ORDER — VANCOMYCIN HCL IN DEXTROSE 1-5 GM/200ML-% IV SOLN: 1000.0000 mg | Freq: Once | INTRAVENOUS | Status: AC

## 2021-10-30 MED ORDER — VANCOMYCIN HCL IN DEXTROSE 1-5 GM/200ML-% IV SOLN
INTRAVENOUS | Status: AC
  Administered 2021-10-30: 1000 mg via INTRAVENOUS
  Filled 2021-10-30: qty 200

## 2021-10-30 MED ORDER — LIDOCAINE VISCOUS HCL 2 % MT SOLN
OROMUCOSAL | Status: AC
  Administered 2021-10-30: 1 mL
  Filled 2021-10-30: qty 15

## 2021-10-30 MED ORDER — GLUCAGON HCL RDNA (DIAGNOSTIC) 1 MG IJ SOLR
INTRAMUSCULAR | Status: AC
  Filled 2021-10-30: qty 1

## 2021-10-30 MED ORDER — FENTANYL CITRATE (PF) 100 MCG/2ML IJ SOLN
INTRAMUSCULAR | Status: DC
Start: 2021-10-30 — End: 2021-10-30
  Filled 2021-10-30: qty 2

## 2021-10-30 MED ORDER — FENTANYL CITRATE (PF) 100 MCG/2ML IJ SOLN
INTRAMUSCULAR | Status: AC
  Filled 2021-10-30: qty 2

## 2021-10-30 MED ORDER — FENTANYL CITRATE (PF) 100 MCG/2ML IJ SOLN
INTRAMUSCULAR | Status: AC | PRN
Start: 2021-10-30 — End: 2021-10-30
  Administered 2021-10-30: 50 ug via INTRAVENOUS
  Administered 2021-10-30: 25 ug via INTRAVENOUS

## 2021-10-30 MED ORDER — MIDAZOLAM HCL 2 MG/2ML IJ SOLN
INTRAMUSCULAR | Status: AC
  Filled 2021-10-30: qty 2

## 2021-10-30 MED ORDER — IOHEXOL 300 MG/ML  SOLN
100.0000 mL | Freq: Once | INTRAMUSCULAR | Status: AC | PRN
  Administered 2021-10-30: 20 mL

## 2021-10-30 MED ORDER — GLUCAGON HCL (RDNA) 1 MG IJ SOLR
INTRAMUSCULAR | Status: AC | PRN
  Administered 2021-10-30: 1 mg via INTRAVENOUS

## 2021-10-30 MED ORDER — LIDOCAINE HCL 1 % IJ SOLN
INTRAMUSCULAR | Status: AC
  Administered 2021-10-30: 10 mL
  Filled 2021-10-30: qty 20

## 2021-10-30 NOTE — Procedures (Signed)
Interventional Radiology Procedure Note ? ?Procedure: Percutaneous G tube placement ? ?Indication: Dysphagia ? ?Findings: Please refer to procedural dictation for full description. ? ?Complications: None ? ?EBL: < 10 mL ? ?Miachel Roux, MD ?[phone] ? ? ?

## 2021-10-30 NOTE — Progress Notes (Signed)
?[facility]  ?ROUNDING NOTE  ? ?Subjective:  ?Patient more awake and alert this AM. ?Resting comfortably in bed. ?Due for hemodialysis treatment today. ?Family member at bedside. ? ? ?Objective:  ?Vital signs in last 24 hours:  ?Temperature 96.3 pulse 85 respirations 22 blood pressure 177/98 ? ?Physical Exam: ?General: Chronically ill-appearing  ?Head: Mild facial edema, NG tube in place  ?Eyes: Bilateral ptosis  ?Neck: Supple  ?Lungs:  Clear to auscultation, normal effort  ?Heart: S1S2 no rubs  ?Abdomen:  Soft, nontender, bowel sounds present  ?Extremities: Right AKA, diminutive left lower extremity  ?Neurologic: Awake, alert, following commands  ?Skin: No acute rash  ?Access: Left IJ PermCath, right upper extremity AV access  ? ? ?Basic Metabolic Panel: ?Recent Labs  ?Lab 10/25/21 ?0215 10/26/21 ?3244 10/27/21 ?0444  ?NA 133* 134* 132*  ?K 3.2* 3.6 3.8  ?CL 96* 96* 95*  ?CO2 26 28 27   ?GLUCOSE 154* 62* 92  ?BUN 53* 21* 24*  ?CREATININE 3.90* 2.42* 3.48*  ?CALCIUM 9.2 9.3 9.9  ?PHOS  --   --  3.6  ? ? ?Liver Function Tests: ?Recent Labs  ?Lab 10/26/21 ?0102 10/27/21 ?0444  ?AST 20  --   ?ALT 14  --   ?ALKPHOS 59  --   ?BILITOT 0.6  --   ?PROT 6.3*  --   ?ALBUMIN 2.8* 2.6*  ? ?No results for input(s): LIPASE, AMYLASE in the last 168 hours. ?No results for input(s): AMMONIA in the last 168 hours. ? ?CBC: ?Recent Labs  ?Lab 10/25/21 ?0215 10/26/21 ?7253 10/27/21 ?6644 10/30/21 ?0347  ?WBC 13.1* 12.6* 10.4 9.7  ?NEUTROABS 8.6* 8.0*  --   --   ?HGB 7.6* 7.1* 7.3* 8.4*  ?HCT 25.6* 24.6* 24.4* 27.9*  ?MCV 96.6 96.1 95.3 93.3  ?PLT 272 264 272 332  ? ? ?Cardiac Enzymes: ?No results for input(s): CKTOTAL, CKMB, CKMBINDEX, TROPONINI in the last 168 hours. ? ?BNP: ?Invalid input(s): POCBNP ? ?CBG: ?Recent Labs  ?Lab 10/24/21 ?2323 10/25/21 ?0404 10/25/21 ?4259 10/25/21 ?1121 10/25/21 ?2049  ?GLUCAP 154* 136* 164* 130* 77  ? ? ?Microbiology: ?Results for orders placed or performed during the hospital encounter of  09/15/21  ?Resp Panel by RT-PCR (Flu A&B, Covid) Nasopharyngeal Swab     Status: None  ? Collection Time: 09/15/21  4:11 PM  ? Specimen: Nasopharyngeal Swab; Nasopharyngeal(NP) swabs in vial transport medium  ?Result Value Ref Range Status  ? SARS Coronavirus 2 by RT PCR NEGATIVE NEGATIVE Final  ?  Comment: (NOTE) ?SARS-CoV-2 target nucleic acids are NOT DETECTED. ? ?The SARS-CoV-2 RNA is generally detectable in upper respiratory ?specimens during the acute phase of infection. The lowest ?concentration of SARS-CoV-2 viral copies this assay can detect is ?138 copies/mL. A negative result does not preclude SARS-Cov-2 ?infection and should not be used as the sole basis for treatment or ?other patient management decisions. A negative result may occur with  ?improper specimen collection/handling, submission of specimen other ?than nasopharyngeal swab, presence of viral mutation(s) within the ?areas targeted by this assay, and inadequate number of viral ?copies(<138 copies/mL). A negative result must be combined with ?clinical observations, patient history, and epidemiological ?information. The expected result is Negative. ? ?Fact Sheet for Patients:  ?EntrepreneurPulse.com.au ? ?Fact Sheet for Healthcare Providers:  ?IncredibleEmployment.be ? ?This test is no t yet approved or cleared by the [place] FDA and  ?has been authorized for detection and/or diagnosis of SARS-CoV-2 by ?FDA under an Emergency Use Authorization (EUA). This EUA will  remain  ?in effect (meaning this test can be used) for the duration of the ?COVID-19 declaration under Section 564(b)(1) of the Act, 21 ?U.S.C.section 360bbb-3(b)(1), unless the authorization is terminated  ?or revoked sooner.  ? ? ?  ? Influenza A by PCR NEGATIVE NEGATIVE Final  ? Influenza B by PCR NEGATIVE NEGATIVE Final  ?  Comment: (NOTE) ?The Xpert Xpress SARS-CoV-2/FLU/RSV plus assay is intended as an aid ?in the diagnosis of influenza from  Nasopharyngeal swab specimens and ?should not be used as a sole basis for treatment. Nasal washings and ?aspirates are unacceptable for Xpert Xpress SARS-CoV-2/FLU/RSV ?testing. ? ?Fact Sheet for Patients: ?EntrepreneurPulse.com.au ? ?Fact Sheet for Healthcare Providers: ?IncredibleEmployment.be ? ?This test is not yet approved or cleared by the [place] FDA and ?has been authorized for detection and/or diagnosis of SARS-CoV-2 by ?FDA under an Emergency Use Authorization (EUA). This EUA will remain ?in effect (meaning this test can be used) for the duration of the ?COVID-19 declaration under Section 564(b)(1) of the Act, 21 U.S.C. ?section 360bbb-3(b)(1), unless the authorization is terminated or ?revoked. ? ?Performed at [facility], [place] [address] [place], [place] ?[address] ?  ?Culture, blood (routine x 2)     Status: None  ? Collection Time: 09/16/21  9:40 AM  ? Specimen: Peripheral; Blood  ?Result Value Ref Range Status  ? Specimen Description BLOOD RIGHT ANTECUBITAL  Final  ? Special Requests   Final  ?  BOTTLES DRAWN AEROBIC AND ANAEROBIC Blood Culture adequate volume  ? Culture   Final  ?  NO GROWTH 5 DAYS ?Performed at [facility], [place] [address] [place], [place] [address] ?  ? Report Status 09/21/2021 FINAL  Final  ?Culture, blood (routine x 2)     Status: None  ? Collection Time: 09/16/21  9:42 AM  ? Specimen: Peripheral; Blood  ?Result Value Ref Range Status  ? Specimen Description BLOOD LEFT HAND  Final  ? Special Requests   Final  ?  BOTTLES DRAWN AEROBIC AND ANAEROBIC Blood Culture results may not be optimal due to an inadequate volume of blood received in culture bottles  ? Culture   Final  ?  NO GROWTH 5 DAYS ?Performed at [facility], [place] [address], [place], [place] [address] ?  ? Report Status 09/21/2021 FINAL  Final  ?Surgical pcr screen     Status: None  ? Collection Time: 09/17/21  6:16 AM  ? Specimen: Nasal Mucosa; Nasal Swab   ?Result Value Ref Range Status  ? MRSA, PCR NEGATIVE NEGATIVE Final  ? Staphylococcus aureus NEGATIVE NEGATIVE Final  ?  Comment: (NOTE) ?The Xpert SA Assay (FDA approved for NASAL specimens in patients 10 ?years of age and older), is one component of a comprehensive ?surveillance program. It is not intended to diagnose infection nor to ?guide or monitor treatment. ?Performed at [facility], [place] [address] [place], [place] ?[address] ?  ?SARS CORONAVIRUS 2 (TAT 6-24 HRS) Nasopharyngeal Nasopharyngeal Swab     Status: None  ? Collection Time: 09/20/21  4:26 PM  ? Specimen: Nasopharyngeal Swab  ?Result Value Ref Range Status  ? SARS Coronavirus 2 NEGATIVE NEGATIVE Final  ?  Comment: (NOTE) ?SARS-CoV-2 target nucleic acids are NOT DETECTED. ? ?The SARS-CoV-2 RNA is generally detectable in upper and lower ?respiratory specimens during the acute phase of infection. Negative ?results do not preclude SARS-CoV-2 infection, do not rule out ?co-infections with other pathogens, and should not be used  as the ?sole basis for treatment or other patient management decisions. ?Negative results must be combined with clinical observations, ?patient history, and epidemiological information. The expected ?result is Negative. ? ?Fact Sheet for Patients: ?SugarRoll.be ? ?Fact Sheet for Healthcare Providers: ?https://www.woods-mathews.com/ ? ?This test is not yet approved or cleared by the [place] FDA and  ?has been authorized for detection and/or diagnosis of SARS-CoV-2 by ?FDA under an Emergency Use Authorization (EUA). This EUA will remain  ?in effect (meaning this test can be used) for the duration of the ?COVID-19 declaration under Se ction 564(b)(1) of the Act, 21 U.S.C. ?section 360bbb-3(b)(1), unless the authorization is terminated or ?revoked sooner. ? ?Performed at [facility], [place] [address], [place], [place] ?[address] ?  ?MRSA Next Gen by PCR, Nasal     Status:  None  ? Collection Time: 09/22/21  9:40 AM  ? Specimen: Peripheral; Nasal Swab  ?Result Value Ref Range Status  ? MRSA by PCR Next Gen NOT DETECTED NOT DETECTED Final  ?  Comment: (NOTE) ?The GeneXpert MRS

## 2021-10-30 NOTE — Sedation Documentation (Signed)
Pt writhing in pain. MD aware and at bedside. See MAR ?

## 2021-10-31 NOTE — Progress Notes (Signed)
? ? ?  Percutaneous G tube placed yesterday in IR ?Site is clean and dry ?NT no bleeding ?No hematoma ? ?Can use now ? ?T tacs to be removed 10 days ?IR PA will do so ? ?

## 2021-11-01 ENCOUNTER — Other Ambulatory Visit (HOSPITAL_COMMUNITY): Payer: Medicare Other

## 2021-11-01 LAB — RENAL FUNCTION PANEL
Albumin: 2.1 g/dL — ABNORMAL LOW (ref 3.5–5.0)
Anion gap: 7 (ref 5–15)
BUN: 30 mg/dL — ABNORMAL HIGH (ref 6–20)
CO2: 28 mmol/L (ref 22–32)
Calcium: 10.9 mg/dL — ABNORMAL HIGH (ref 8.9–10.3)
Chloride: 93 mmol/L — ABNORMAL LOW (ref 98–111)
Creatinine, Ser: 4.42 mg/dL — ABNORMAL HIGH (ref 0.44–1.00)
GFR, Estimated: 12 mL/min — ABNORMAL LOW (ref >60)
Glucose, Bld: 64 mg/dL — ABNORMAL LOW (ref 70–99)
Phosphorus: 4.6 mg/dL (ref 2.5–4.6)
Potassium: 3.8 mmol/L (ref 3.5–5.1)
Sodium: 128 mmol/L — ABNORMAL LOW (ref 135–145)

## 2021-11-01 LAB — CBC
HCT: 22.9 % — ABNORMAL LOW (ref 36.0–46.0)
Hemoglobin: 7 g/dL — ABNORMAL LOW (ref 12.0–15.0)
MCH: 28 pg (ref 26.0–34.0)
MCHC: 30.6 g/dL (ref 30.0–36.0)
MCV: 91.6 fL (ref 80.0–100.0)
Platelets: 278 10*3/uL (ref 150–400)
RBC: 2.5 MIL/uL — ABNORMAL LOW (ref 3.87–5.11)
RDW: 17.8 % — ABNORMAL HIGH (ref 11.5–15.5)
WBC: 11.9 10*3/uL — ABNORMAL HIGH (ref 4.0–10.5)
nRBC: 0 % (ref 0.0–0.2)

## 2021-11-01 LAB — PREPARE RBC (CROSSMATCH)

## 2021-11-01 NOTE — Progress Notes (Signed)
?[facility]  ?ROUNDING NOTE  ? ?Subjective:  ?Patient underwent PEG tube placement on Monday. ?Seen and evaluated during hemodialysis treatment today. ?Tolerating well. ?Ultrafiltration target 2 kg. ? ? ?Objective:  ?Vital signs in last 24 hours:  ?Temperature 97.1 pulse 84 respirations 11 blood pressure 162/79 ? ?Physical Exam: ?General: Chronically ill-appearing  ?Head: Mild facial edema, oral mucosa moist  ?Eyes: Bilateral ptosis  ?Neck: Supple  ?Lungs:  Clear to auscultation, normal effort  ?Heart: S1S2 no rubs  ?Abdomen:  Soft, nontender, bowel sounds present, PEG tube  ?Extremities: Right AKA, diminutive left lower extremity  ?Neurologic: Awake, alert, following commands  ?Skin: No acute rash  ?Access: Left IJ PermCath, right upper extremity AV access  ? ? ?Basic Metabolic Panel: ?Recent Labs  ?Lab 10/26/21 ?5465 10/27/21 ?6812 10/30/21 ?7517 11/01/21 ?0017  ?NA 134* 132* 132* 128*  ?K 3.6 3.8 4.1 3.8  ?CL 96* 95* 92* 93*  ?CO2 28 27 30 28   ?GLUCOSE 62* 92 108* 64*  ?BUN 21* 24* 32* 30*  ?CREATININE 2.42* 3.48* 4.62* 4.42*  ?CALCIUM 9.3 9.9 11.3* 10.9*  ?PHOS  --  3.6 4.8* 4.6  ? ? ? ?Liver Function Tests: ?Recent Labs  ?Lab 10/26/21 ?4944 10/27/21 ?9675 10/30/21 ?9163 11/01/21 ?8466  ?AST 20  --   --   --   ?ALT 14  --   --   --   ?ALKPHOS 59  --   --   --   ?BILITOT 0.6  --   --   --   ?PROT 6.3*  --   --   --   ?ALBUMIN 2.8* 2.6* 2.5* 2.1*  ? ? ?No results for input(s): LIPASE, AMYLASE in the last 168 hours. ?No results for input(s): AMMONIA in the last 168 hours. ? ?CBC: ?Recent Labs  ?Lab 10/26/21 ?5993 10/27/21 ?5701 10/30/21 ?7793 11/01/21 ?9030  ?WBC 12.6* 10.4 9.7 11.9*  ?NEUTROABS 8.0*  --   --   --   ?HGB 7.1* 7.3* 8.4* 7.0*  ?HCT 24.6* 24.4* 27.9* 22.9*  ?MCV 96.1 95.3 93.3 91.6  ?PLT 264 272 332 278  ? ? ? ?Cardiac Enzymes: ?No results for input(s): CKTOTAL, CKMB, CKMBINDEX, TROPONINI in the last 168 hours. ? ?BNP: ?Invalid input(s): POCBNP ? ?CBG: ?Recent Labs  ?Lab 10/25/21 ?0923  10/25/21 ?1121 10/25/21 ?2049  ?GLUCAP 164* 130* 77  ? ? ? ?Microbiology: ?Results for orders placed or performed during the hospital encounter of 09/15/21  ?Resp Panel by RT-PCR (Flu A&B, Covid) Nasopharyngeal Swab     Status: None  ? Collection Time: 09/15/21  4:11 PM  ? Specimen: Nasopharyngeal Swab; Nasopharyngeal(NP) swabs in vial transport medium  ?Result Value Ref Range Status  ? SARS Coronavirus 2 by RT PCR NEGATIVE NEGATIVE Final  ?  Comment: (NOTE) ?SARS-CoV-2 target nucleic acids are NOT DETECTED. ? ?The SARS-CoV-2 RNA is generally detectable in upper respiratory ?specimens during the acute phase of infection. The lowest ?concentration of SARS-CoV-2 viral copies this assay can detect is ?138 copies/mL. A negative result does not preclude SARS-Cov-2 ?infection and should not be used as the sole basis for treatment or ?other patient management decisions. A negative result may occur with  ?improper specimen collection/handling, submission of specimen other ?than nasopharyngeal swab, presence of viral mutation(s) within the ?areas targeted by this assay, and inadequate number of viral ?copies(<138 copies/mL). A negative result must be combined with ?clinical observations, patient history, and epidemiological ?information. The expected result is Negative. ? ?Fact Sheet for Patients:  ?  EntrepreneurPulse.com.au ? ?Fact Sheet for Healthcare Providers:  ?IncredibleEmployment.be ? ?This test is no t yet approved or cleared by the [place] FDA and  ?has been authorized for detection and/or diagnosis of SARS-CoV-2 by ?FDA under an Emergency Use Authorization (EUA). This EUA will remain  ?in effect (meaning this test can be used) for the duration of the ?COVID-19 declaration under Section 564(b)(1) of the Act, 21 ?U.S.C.section 360bbb-3(b)(1), unless the authorization is terminated  ?or revoked sooner.  ? ? ?  ? Influenza A by PCR NEGATIVE NEGATIVE Final  ? Influenza B by PCR  NEGATIVE NEGATIVE Final  ?  Comment: (NOTE) ?The Xpert Xpress SARS-CoV-2/FLU/RSV plus assay is intended as an aid ?in the diagnosis of influenza from Nasopharyngeal swab specimens and ?should not be used as a sole basis for treatment. Nasal washings and ?aspirates are unacceptable for Xpert Xpress SARS-CoV-2/FLU/RSV ?testing. ? ?Fact Sheet for Patients: ?EntrepreneurPulse.com.au ? ?Fact Sheet for Healthcare Providers: ?IncredibleEmployment.be ? ?This test is not yet approved or cleared by the [place] FDA and ?has been authorized for detection and/or diagnosis of SARS-CoV-2 by ?FDA under an Emergency Use Authorization (EUA). This EUA will remain ?in effect (meaning this test can be used) for the duration of the ?COVID-19 declaration under Section 564(b)(1) of the Act, 21 U.S.C. ?section 360bbb-3(b)(1), unless the authorization is terminated or ?revoked. ? ?Performed at [facility], [place] [address] [place], [place] ?[address] ?  ?Culture, blood (routine x 2)     Status: None  ? Collection Time: 09/16/21  9:40 AM  ? Specimen: Peripheral; Blood  ?Result Value Ref Range Status  ? Specimen Description BLOOD RIGHT ANTECUBITAL  Final  ? Special Requests   Final  ?  BOTTLES DRAWN AEROBIC AND ANAEROBIC Blood Culture adequate volume  ? Culture   Final  ?  NO GROWTH 5 DAYS ?Performed at [facility], [place] [address], [place], [place] [address] ?  ? Report Status 09/21/2021 FINAL  Final  ?Culture, blood (routine x 2)     Status: None  ? Collection Time: 09/16/21  9:42 AM  ? Specimen: Peripheral; Blood  ?Result Value Ref Range Status  ? Specimen Description BLOOD LEFT HAND  Final  ? Special Requests   Final  ?  BOTTLES DRAWN AEROBIC AND ANAEROBIC Blood Culture results may not be optimal due to an inadequate volume of blood received in culture bottles  ? Culture   Final  ?  NO GROWTH 5 DAYS ?Performed at [facility], [place] [address] [place], [place] [address] ?  ? Report  Status 09/21/2021 FINAL  Final  ?Surgical pcr screen     Status: None  ? Collection Time: 09/17/21  6:16 AM  ? Specimen: Nasal Mucosa; Nasal Swab  ?Result Value Ref Range Status  ? MRSA, PCR NEGATIVE NEGATIVE Final  ? Staphylococcus aureus NEGATIVE NEGATIVE Final  ?  Comment: (NOTE) ?The Xpert SA Assay (FDA approved for NASAL specimens in patients 63 ?years of age and older), is one component of a comprehensive ?surveillance program. It is not intended to diagnose infection nor to ?guide or monitor treatment. ?Performed at [facility], [place] [address], [place], [place] ?[address] ?  ?SARS CORONAVIRUS 2 (TAT 6-24 HRS) Nasopharyngeal Nasopharyngeal Swab     Status: None  ? Collection Time: 09/20/21  4:26 PM  ? Specimen: Nasopharyngeal Swab  ?Result Value Ref Range Status  ? SARS Coronavirus 2 NEGATIVE NEGATIVE Final  ?  Comment: (NOTE) ?  SARS-CoV-2 target nucleic acids are NOT DETECTED. ? ?The SARS-CoV-2 RNA is generally detectable in upper and lower ?respiratory specimens during the acute phase of infection. Negative ?results do not preclude SARS-CoV-2 infection, do not rule out ?co-infections with other pathogens, and should not be used as the ?sole basis for treatment or other patient management decisions. ?Negative results must be combined with clinical observations, ?patient history, and epidemiological information. The expected ?result is Negative. ? ?Fact Sheet for Patients: ?SugarRoll.be ? ?Fact Sheet for Healthcare Providers: ?https://www.woods-mathews.com/ ? ?This test is not yet approved or cleared by the [place] FDA and  ?has been authorized for detection and/or diagnosis of SARS-CoV-2 by ?FDA under an Emergency Use Authorization (EUA). This EUA will remain  ?in effect (meaning this test can be used) for the duration of the ?COVID-19 declaration under Se ction 564(b)(1) of the Act, 21 U.S.C. ?section 360bbb-3(b)(1), unless the authorization is terminated  or ?revoked sooner. ? ?Performed at [facility], [place] [address] [place], [place] ?[address] ?  ?MRSA Next Gen by PCR, Nasal     Status: None  ? Collection Time: 09/22/21  9:40 AM  ? Specimen: Peri

## 2021-11-02 LAB — BPAM RBC
Blood Product Expiration Date: 202304152359
ISSUE DATE / TIME: 202303222336
Unit Type and Rh: 5100

## 2021-11-02 LAB — TYPE AND SCREEN
ABO/RH(D): O POS
Antibody Screen: NEGATIVE
Unit division: 0

## 2021-11-02 LAB — CBC
HCT: 29.4 % — ABNORMAL LOW (ref 36.0–46.0)
Hemoglobin: 9.2 g/dL — ABNORMAL LOW (ref 12.0–15.0)
MCH: 28.2 pg (ref 26.0–34.0)
MCHC: 31.3 g/dL (ref 30.0–36.0)
MCV: 90.2 fL (ref 80.0–100.0)
Platelets: 275 10*3/uL (ref 150–400)
RBC: 3.26 MIL/uL — ABNORMAL LOW (ref 3.87–5.11)
RDW: 18.3 % — ABNORMAL HIGH (ref 11.5–15.5)
WBC: 19.2 10*3/uL — ABNORMAL HIGH (ref 4.0–10.5)
nRBC: 0 % (ref 0.0–0.2)

## 2021-11-02 LAB — LACTIC ACID, PLASMA: Lactic Acid, Venous: 1 mmol/L (ref 0.5–1.9)

## 2021-11-03 LAB — RENAL FUNCTION PANEL
Albumin: 2.2 g/dL — ABNORMAL LOW (ref 3.5–5.0)
Anion gap: 7 (ref 5–15)
BUN: 32 mg/dL — ABNORMAL HIGH (ref 6–20)
CO2: 29 mmol/L (ref 22–32)
Calcium: 11.5 mg/dL — ABNORMAL HIGH (ref 8.9–10.3)
Chloride: 100 mmol/L (ref 98–111)
Creatinine, Ser: 4.08 mg/dL — ABNORMAL HIGH (ref 0.44–1.00)
GFR, Estimated: 14 mL/min — ABNORMAL LOW (ref >60)
Glucose, Bld: 196 mg/dL — ABNORMAL HIGH (ref 70–99)
Phosphorus: 4.2 mg/dL (ref 2.5–4.6)
Potassium: 4 mmol/L (ref 3.5–5.1)
Sodium: 136 mmol/L (ref 135–145)

## 2021-11-03 LAB — CBC
HCT: 32.7 % — ABNORMAL LOW (ref 36.0–46.0)
Hemoglobin: 9.8 g/dL — ABNORMAL LOW (ref 12.0–15.0)
MCH: 27.8 pg (ref 26.0–34.0)
MCHC: 30 g/dL (ref 30.0–36.0)
MCV: 92.6 fL (ref 80.0–100.0)
Platelets: 322 10*3/uL (ref 150–400)
RBC: 3.53 MIL/uL — ABNORMAL LOW (ref 3.87–5.11)
RDW: 18.1 % — ABNORMAL HIGH (ref 11.5–15.5)
WBC: 17.6 10*3/uL — ABNORMAL HIGH (ref 4.0–10.5)
nRBC: 0 % (ref 0.0–0.2)

## 2021-11-03 NOTE — Consult Note (Signed)
?          Infectious Disease Consultation ? ? ?[name]  PTW:[id number]  DOB: [birth year]  DOA: 10/25/2021 ? ?Requesting physician: ?Dr. Owens Shark ? ?Reason for consultation: ?Antibiotic recommendations ? ?History of Present Illness: ?[name] is an 41 y.o. female with medical history significant for diabetes mellitus type 2, ESRD on hemodialysis, secondary hyperparathyroidism, peripheral vascular disease, hypertension with poor compliance with medical treatment was admitted to [facility] on 09/15/2021 after she was sent from dialysis due to altered mental status.  She has a history of TMA of the right foot that grew MSSA and was on Ancef with dialysis.  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  Patient is also legally blind.  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  MRI of the brain on 09/22/2021 showed scattered punctate acute infarcts throughout both cerebral hemispheres.  Transesophageal echo on 10/02/2021 showed small PFO with no thrombus.  Patient was seen by neurology and the recommended treatment with aspirin, Plavix, statins and 30-day cardiac event monitoring as outpatient.  Patient also had bilateral ptosis, facial diplegia, ophthalmoplegia, hyperreflexia.  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  MRI of the brain did not show any intracranial mass or abnormal enhancement to explain the patient's symptoms.  She underwent Plex on 3/1, 3/3, 3/4, 3/7, 3/9.  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  Dialyzed via left IJ tunnel catheter.  Due to her complex medical problems she was transferred and admitted to [facility].  Patient is on Monday/Wednesday/Friday dialysis.  She has been having worsening leukocytosis. ? ? ?Review of Systems:  ?As per HPI  otherwise 10 point review of systems negative.  ? ?Past Medical History: ?Past Medical History:  ?Diagnosis Date  ? Anemia   ? Anxiety   ? Blind right eye 2008  ? Diabetes mellitus without complication ([place])   ? Dialysis patient [facility])   ? ESRD (end stage renal disease) ([place])   ? Dialysis T/Th/Sa  ? ? ?Past Surgical History: ?Past Surgical History:  ?Procedure Laterality Date  ? AMPUTATION Right 08/11/2021  ? Procedure: TRANSMETATARSAL AMPUTATION OF RIGHT FOOT;  Surgeon: Serafina Mitchell, MD;  Location: [place];  Service: Vascular;  Laterality: Right;  ? AMPUTATION Right 09/17/2021  ? Procedure: AMPUTATION RIGHT BELOW KNEE;  Surgeon: Serafina Mitchell, MD;  Location: [facility] OR;  Service: Vascular;  Laterality: Right;  ? AMPUTATION Right 10/11/2021  ? Procedure: RIGHT ABOVE KNEE AMPUTATION;  Surgeon: Serafina Mitchell, MD;  Location: [facility] OR;  Service: Vascular;  Laterality: Right;  ? AMPUTATION TOE Left   ? APPLICATION OF WOUND VAC  08/16/2021  ? Procedure: APPLICATION OF WOUND VAC;  Surgeon: Serafina Mitchell, MD;  Location: [place];  Service: Vascular;;  ? AV FISTULA PLACEMENT    ? BUBBLE STUDY  10/02/2021  ? Procedure: BUBBLE STUDY;  Surgeon: Fay Records, MD;  Location: [place];  Service: Cardiovascular;;  ? CESAREAN SECTION  2011  ? FISTULA SUPERFICIALIZATION Right 07/27/2019  ? Procedure: 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;  Surgeon: Angelia Mould, MD;  Location: [facility] OR;  Service: Vascular;  Laterality: Right;  ? INSERTION OF DIALYSIS CATHETER Left 07/27/2019  ? Procedure: INSERTION OF TUNNELED  DIALYSIS CATHETER;  Surgeon: Angelia Mould, MD;  [facility] OR;  Service: Vascular;  Laterality: Left;  ? INSERTION OF DIALYSIS CATHETER Left 10/23/2021  ? Procedure: INSERTION OF TUNNELED PALINDROME PRECISION DIALYSIS CATHETER (23cm);  Surgeon: Marty Heck, MD;  Location: [place];  Service: Vascular;  Laterality: Left;  ? IR FLUORO GUIDE CV LINE LEFT  10/16/2021  ? IR FLUORO GUIDE CV LINE  RIGHT  10/11/2021  ? IR GASTROSTOMY TUBE MOD SED  10/30/2021  ? IR US GUIDE VASC ACCESS LEFT  10/16/2021  ? IR US GUIDE VASC ACCESS RIGHT  10/11/2021  ? LOWER EXTREMITY ANGIOGRAPHY N/A 08/11/2021  ? Procedure: LOWER EXTREMITY ANGIOGRAPHY;  Surgeon: Serafina Mitchell, MD;  Location: [place] CV LAB;  Service: Cardiovascular;  Laterality: N/A;  ? REVISON OF ARTERIOVENOUS FISTULA Right 10/23/2021  ? Procedure: REVISON OF ARTERIOVENOUS FISTULA  ARM AND PLICATION;  Surgeon: Marty Heck, MD;  Location: [place];  Service: Vascular;  Laterality: Right;  ? TEE WITHOUT CARDIOVERSION N/A 10/02/2021  ? Procedure: TRANSESOPHAGEAL ECHOCARDIOGRAM (TEE);  Surgeon: Fay Records, MD;  Location: [facility];  Service: Cardiovascular;  Laterality: N/A;  ? TRANSMETATARSAL AMPUTATION Right 08/16/2021  ? Procedure: CLOSURE OF RIGHT TRANSMETATARSAL AMPUTATION;  Surgeon: Serafina Mitchell, MD;  Location: [place];  Service: Vascular;  Laterality: Right;  ? ?Allergies:  ?Allergies  ?Allergen Reactions  ? Morphine Rash and Other (See Comments)  ? Peanut-Containing Drug Products Anaphylaxis and Hives  ? Penicillins Rash  ?  Rash in 2008.  Tolerated cefazolin in 2020  ? Chocolate Hives  ? ? ?Social History: ? reports that she has never smoked. She has never used smokeless tobacco. She reports that she does not drink alcohol and does not use drugs. ? ? ?Family History: ?Family History  ?Problem Relation Age of Onset  ? Diabetes Mother   ? Diabetes Father   ? ? ?Physical Exam: ?Vitals: Temperature 99.8, pulse 84, respiratory rate 15, blood pressure 127/39, pulse oximetry 99% on O2 nasal cannula ? ?Constitutional: Chronically ill-appearing female, awake, not in any distress at this time ?Head: Atraumatic, normocephalic ?Eyes: Legally blind, eyes closed ?ENMT: external ears and nose appear normal, a little hard of hearing, Lips appears normal, moist oral mucosa ?Neck: supple ?CVS: S1-S2 ?Respiratory: Decreased breath sound lower lobes, crackles, no  rhonchi or wheezing ?Abdomen: soft nontender, nondistended, normal bowel sounds ?Musculoskeletal: Right AKA with dressing in place, right upper extremity swelling, no edema of the left lower extremity ?Neuro: Severe debility with generalized weakness ?Psych:stable mood and affect, mental status ?Skin: no rashes, left heel unstageable pressure injury, sacrococcygeal unstageable pressure injury, right AKA with dressing in place, right upper arm surgical incision ? ?Data reviewed:  I have personally reviewed following labs and imaging studies ?Labs:  ?CBC: ?Recent Labs  ?Lab 10/30/21 ?0629 11/01/21 ?0716 11/02/21 ?0717 11/03/21 ?0440  ?WBC 9.7 11.9* 19.2* 17.6*  ?HGB 8.4* 7.0* 9.2* 9.8*  ?HCT 27.9* 22.9* 29.4* 32.7*  ?MCV 93.3 91.6 90.2 92.6  ?PLT 332 278 275 322  ? ? ?Basic Metabolic Panel: ?Recent Labs  ?Lab 10/30/21 ?0629 11/01/21 ?0717 11/03/21 ?0440  ?NA 132* 128* 136  ?K 4.1 3.8 4.0  ?CL 92* 93* 100  ?CO2 30 28 29   ?GLUCOSE 108* 64* 196*  ?BUN 32* 30* 32*  ?CREATININE 4.62* 4.42* 4.08*  ?CALCIUM 11.3* 10.9* 11.5*  ?PHOS 4.8* 4.6 4.2  ? ?GFR ?Estimated Creatinine Clearance: 16.2 mL/min (A) (by C-G formula based on SCr of 4.08 mg/dL (H)). ?Liver Function Tests: ?Recent Labs  ?Lab 10/30/21 ?0629 11/01/21 ?  9242 11/03/21 ?0440  ?ALBUMIN 2.5* 2.1* 2.2*  ? ?No results for input(s): LIPASE, AMYLASE in the last 168 hours. ?No results for input(s): AMMONIA in the last 168 hours. ?Coagulation profile ?No results for input(s): INR, PROTIME in the last 168 hours. ? ?Cardiac Enzymes: ?No results for input(s): CKTOTAL, CKMB, CKMBINDEX, TROPONINI in the last 168 hours. ?BNP: ?Invalid input(s): POCBNP ?CBG: ?No results for input(s): GLUCAP in the last 168 hours. ?D-Dimer ?No results for input(s): DDIMER in the last 72 hours. ?Hgb A1c ?No results for input(s): HGBA1C in the last 72 hours. ?Lipid Profile ?No results for input(s): CHOL, HDL, LDLCALC, TRIG, CHOLHDL, LDLDIRECT in the last 72 hours. ?Thyroid function studies ?No  results for input(s): TSH, T4TOTAL, T3FREE, THYROIDAB in the last 72 hours. ? ?Invalid input(s): FREET3 ?Anemia work up ?No results for input(s): VITAMINB12, FOLATE, FERRITIN, TIBC, IRON, RETICCTPCT in the last 72 ho

## 2021-11-03 NOTE — Progress Notes (Signed)
?[facility]  ?ROUNDING NOTE  ? ?Subjective:  ?Patient seen and evaluated at bedside this AM. ?Resting comfortably at the moment. ?Due for hemodialysis treatment today. ? ? ?Objective:  ?Vital signs in last 24 hours:  ?Temperature 97.3 pulse 88 respirations 10 blood pressure 109/70 ? ?Physical Exam: ?General: Chronically ill-appearing  ?Head: Mild facial edema, oral mucosa moist  ?Eyes: Bilateral ptosis  ?Neck: Supple  ?Lungs:  Clear to auscultation, normal effort  ?Heart: S1S2 no rubs  ?Abdomen:  Soft, nontender, bowel sounds present, PEG tube  ?Extremities: Right AKA, diminutive left lower extremity  ?Neurologic: Awake, alert, following commands  ?Skin: No acute rash  ?Access: Left IJ PermCath, right upper extremity AV access  ? ? ?Basic Metabolic Panel: ?Recent Labs  ?Lab 10/30/21 ?0629 11/01/21 ?0717 11/03/21 ?0440  ?NA 132* 128* 136  ?K 4.1 3.8 4.0  ?CL 92* 93* 100  ?CO2 30 28 29   ?GLUCOSE 108* 64* 196*  ?BUN 32* 30* 32*  ?CREATININE 4.62* 4.42* 4.08*  ?CALCIUM 11.3* 10.9* 11.5*  ?PHOS 4.8* 4.6 4.2  ? ? ? ?Liver Function Tests: ?Recent Labs  ?Lab 10/30/21 ?0629 11/01/21 ?0717 11/03/21 ?0440  ?ALBUMIN 2.5* 2.1* 2.2*  ? ? ?No results for input(s): LIPASE, AMYLASE in the last 168 hours. ?No results for input(s): AMMONIA in the last 168 hours. ? ?CBC: ?Recent Labs  ?Lab 10/30/21 ?0629 11/01/21 ?0716 11/02/21 ?0717 11/03/21 ?0440  ?WBC 9.7 11.9* 19.2* 17.6*  ?HGB 8.4* 7.0* 9.2* 9.8*  ?HCT 27.9* 22.9* 29.4* 32.7*  ?MCV 93.3 91.6 90.2 92.6  ?PLT 332 278 275 322  ? ? ? ?Cardiac Enzymes: ?No results for input(s): CKTOTAL, CKMB, CKMBINDEX, TROPONINI in the last 168 hours. ? ?BNP: ?Invalid input(s): POCBNP ? ?CBG: ?No results for input(s): GLUCAP in the last 168 hours. ? ? ?Microbiology: ?Results for orders placed or performed during the hospital encounter of 10/25/21  ?Culture, blood (routine x 2)     Status: None (Preliminary result)  ? Collection Time: 11/02/21  7:17 AM  ? Specimen: Left Antecubital; Blood   ?Result Value Ref Range Status  ? Specimen Description LEFT ANTECUBITAL  Final  ? Special Requests   Final  ?  BOTTLES DRAWN AEROBIC AND ANAEROBIC Blood Culture adequate volume  ? Culture   Final  ?  NO GROWTH < 24 HOURS ?Performed at [facility], [place] [address], [place], [place] [address] ?  ? Report Status PENDING  Incomplete  ?Culture, blood (routine x 2)     Status: None (Preliminary result)  ? Collection Time: 11/02/21  7:18 AM  ? Specimen: BLOOD LEFT ARM  ?Result Value Ref Range Status  ? Specimen Description BLOOD LEFT ARM  Final  ? Special Requests   Final  ?  BOTTLES DRAWN AEROBIC AND ANAEROBIC Blood Culture adequate volume  ? Culture   Final  ?  NO GROWTH < 24 HOURS ?Performed at [facility], [place] [address] [place], [place] [address] ?  ? Report Status PENDING  Incomplete  ? ? ?Coagulation Studies: ?No results for input(s): LABPROT, INR in the last 72 hours. ? ?Urinalysis: ?No results for input(s): COLORURINE, LABSPEC, [place], GLUCOSEU, HGBUR, BILIRUBINUR, KETONESUR, PROTEINUR, UROBILINOGEN, NITRITE, LEUKOCYTESUR in the last 72 hours. ? ?Invalid input(s): APPERANCEUR  ? ? ?Imaging: ?DG Chest Port 1 View ? ?Result Date: 11/01/2021 ?CLINICAL DATA:  Fever. EXAM: PORTABLE CHEST 1 VIEW COMPARISON:  Chest x-ray 10/23/2021. FINDINGS: Left-sided central venous catheter tip projects over the distal SVC, unchanged. Enteric tube  has been removed. The cardiomediastinal silhouette is stable and within normal limits. Retrocardiac patchy opacities persist. There is no pleural effusion or pneumothorax. No acute fractures are seen. IMPRESSION: 1. Stable left basilar atelectasis/airspace disease. 2. Enteric tube has been removed. Electronically Signed   By: Ronney Asters M.D.   On: 11/01/2021 15:35   ? ? ?Medications:  ? ? ? ? ? ?Assessment/ Plan:  ?41 y.o. 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 ? ?1.  ESRD on HD.  Patient due for hemodialysis treatment today.  Orders have been prepared. ? ?2.  Anemia of chronic kidney disease.  Hemoglobin up to 9.8 posttransfusion.  Maintain the patient on Retacrit 10,000 IV with dialysis. ? ?3.  Secondary hyperparathyroidism.  Phosphorus acceptable at 4.2.  Continue to monitor.  Serum calcium a bit high at 1.5.  We will need to continue to monitor this as well. ? ?4.  Hypotension.  Continue midodrine 5 mg 3 times daily. ? ? LOS: 0 ?Kree Armato ?3/24/20238:31 AM ? ? ?

## 2021-11-06 LAB — CBC
HCT: 29.7 % — ABNORMAL LOW (ref 36.0–46.0)
Hemoglobin: 9.3 g/dL — ABNORMAL LOW (ref 12.0–15.0)
MCH: 28.5 pg (ref 26.0–34.0)
MCHC: 31.3 g/dL (ref 30.0–36.0)
MCV: 91.1 fL (ref 80.0–100.0)
Platelets: 290 10*3/uL (ref 150–400)
RBC: 3.26 MIL/uL — ABNORMAL LOW (ref 3.87–5.11)
RDW: 17.1 % — ABNORMAL HIGH (ref 11.5–15.5)
WBC: 12.6 10*3/uL — ABNORMAL HIGH (ref 4.0–10.5)
nRBC: 0 % (ref 0.0–0.2)

## 2021-11-06 LAB — RENAL FUNCTION PANEL
Albumin: 1.9 g/dL — ABNORMAL LOW (ref 3.5–5.0)
Anion gap: 9 (ref 5–15)
BUN: 56 mg/dL — ABNORMAL HIGH (ref 6–20)
CO2: 28 mmol/L (ref 22–32)
Calcium: 13 mg/dL — ABNORMAL HIGH (ref 8.9–10.3)
Chloride: 97 mmol/L — ABNORMAL LOW (ref 98–111)
Creatinine, Ser: 5.36 mg/dL — ABNORMAL HIGH (ref 0.44–1.00)
GFR, Estimated: 10 mL/min — ABNORMAL LOW (ref >60)
Glucose, Bld: 117 mg/dL — ABNORMAL HIGH (ref 70–99)
Phosphorus: 3.9 mg/dL (ref 2.5–4.6)
Potassium: 4 mmol/L (ref 3.5–5.1)
Sodium: 134 mmol/L — ABNORMAL LOW (ref 135–145)

## 2021-11-06 LAB — VANCOMYCIN, TROUGH: Vancomycin Tr: 36 ug/mL (ref 15–20)

## 2021-11-06 NOTE — Progress Notes (Signed)
[name]  ?ROUNDING NOTE  ? ?Subjective:  ?Patient due for hemodialysis treatment today. ?Resting comfortably in bed at the moment. ?Noted to have some swelling in the right upper extremity. ? ?Objective:  ?Vital signs in last 24 hours:  ?Temperature 97.3 pulse 88 respirations 10 blood pressure 109/70 ? ?Physical Exam: ?General: Chronically ill-appearing  ?Head: Mild facial edema, oral mucosa moist  ?Eyes: Bilateral ptosis  ?Neck: Supple  ?Lungs:  Clear to auscultation, normal effort  ?Heart: S1S2 no rubs  ?Abdomen:  Soft, nontender, bowel sounds present, PEG tube  ?Extremities: Right AKA, diminutive left lower extremity, right upper extremity swelling  ?Neurologic: Awake, alert, following commands  ?Skin: No acute rash  ?Access: Left IJ PermCath, right upper extremity AV access  ? ? ?Basic Metabolic Panel: ?Recent Labs  ?Lab 11/01/21 ?0717 11/03/21 ?0440  ?NA 128* 136  ?K 3.8 4.0  ?CL 93* 100  ?CO2 28 29  ?GLUCOSE 64* 196*  ?BUN 30* 32*  ?CREATININE 4.42* 4.08*  ?CALCIUM 10.9* 11.5*  ?PHOS 4.6 4.2  ? ? ? ?Liver Function Tests: ?Recent Labs  ?Lab 11/01/21 ?0717 11/03/21 ?0440  ?ALBUMIN 2.1* 2.2*  ? ? ?No results for input(s): LIPASE, AMYLASE in the last 168 hours. ?No results for input(s): AMMONIA in the last 168 hours. ? ?CBC: ?Recent Labs  ?Lab 11/01/21 ?0716 11/02/21 ?0717 11/03/21 ?0440  ?WBC 11.9* 19.2* 17.6*  ?HGB 7.0* 9.2* 9.8*  ?HCT 22.9* 29.4* 32.7*  ?MCV 91.6 90.2 92.6  ?PLT 278 275 322  ? ? ? ?Cardiac Enzymes: ?No results for input(s): CKTOTAL, CKMB, CKMBINDEX, TROPONINI in the last 168 hours. ? ?BNP: ?Invalid input(s): POCBNP ? ?CBG: ?No results for input(s): GLUCAP in the last 168 hours. ? ? ?Microbiology: ?Results for orders placed or performed during the hospital encounter of 10/25/21  ?Culture, blood (routine x 2)     Status: None (Preliminary result)  ? Collection Time: 11/02/21  7:17 AM  ? Specimen: Left Antecubital; Blood  ?Result Value Ref Range Status  ? Specimen Description  LEFT ANTECUBITAL  Final  ? Special Requests   Final  ?  BOTTLES DRAWN AEROBIC AND ANAEROBIC Blood Culture adequate volume  ? Culture   Final  ?  NO GROWTH 3 DAYS ?Performed at [facility], [place] [address] [place], [place] [address] ?  ? Report Status PENDING  Incomplete  ?Culture, blood (routine x 2)     Status: None (Preliminary result)  ? Collection Time: 11/02/21  7:18 AM  ? Specimen: BLOOD LEFT ARM  ?Result Value Ref Range Status  ? Specimen Description BLOOD LEFT ARM  Final  ? Special Requests   Final  ?  BOTTLES DRAWN AEROBIC AND ANAEROBIC Blood Culture adequate volume  ? Culture   Final  ?  NO GROWTH 3 DAYS ?Performed at [facility], [place] [address], [place], [place] [address] ?  ? Report Status PENDING  Incomplete  ? ? ?Coagulation Studies: ?No results for input(s): LABPROT, INR in the last 72 hours. ? ?Urinalysis: ?No results for input(s): COLORURINE, LABSPEC, [place], GLUCOSEU, HGBUR, BILIRUBINUR, KETONESUR, PROTEINUR, UROBILINOGEN, NITRITE, LEUKOCYTESUR in the last 72 hours. ? ?Invalid input(s): APPERANCEUR  ? ? ?Imaging: ?No results found. ? ? ?Medications:  ? ? ? ? ? ?Assessment/ Plan:  ?41 y.o. 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 ? ?1.  ESRD on HD.  We will maintain the patient on MWF dialysis treatment schedule.  She is due for hemodialysis treatment today. ? ?2.  Anemia of chronic kidney disease.  Hemoglobin 9.8 at last check.  Recheck CBC today.  Continue Retacrit 10,000 IV with dialysis. ? ?3.  Secondary  hyperparathyroidism.  Recheck serum phosphorus and calcium today. ? ?4.  Hypotension.  Continue midodrine 5 mg 3 times daily. ? ? LOS: 0 ?[name] ?3/27/20237:52 AM ? ? ?

## 2021-11-07 LAB — CULTURE, BLOOD (ROUTINE X 2)
Culture: NO GROWTH
Culture: NO GROWTH
Special Requests: ADEQUATE
Special Requests: ADEQUATE

## 2021-11-07 LAB — PHOSPHORUS: Phosphorus: 2.6 mg/dL (ref 2.5–4.6)

## 2021-11-08 LAB — RENAL FUNCTION PANEL
Albumin: 1.8 g/dL — ABNORMAL LOW (ref 3.5–5.0)
Anion gap: 10 (ref 5–15)
BUN: 44 mg/dL — ABNORMAL HIGH (ref 6–20)
CO2: 28 mmol/L (ref 22–32)
Calcium: 13.1 mg/dL (ref 8.9–10.3)
Chloride: 95 mmol/L — ABNORMAL LOW (ref 98–111)
Creatinine, Ser: 4.36 mg/dL — ABNORMAL HIGH (ref 0.44–1.00)
GFR, Estimated: 13 mL/min — ABNORMAL LOW (ref >60)
Glucose, Bld: 83 mg/dL (ref 70–99)
Phosphorus: 2.9 mg/dL (ref 2.5–4.6)
Potassium: 4.7 mmol/L (ref 3.5–5.1)
Sodium: 133 mmol/L — ABNORMAL LOW (ref 135–145)

## 2021-11-08 LAB — CBC
HCT: 30.9 % — ABNORMAL LOW (ref 36.0–46.0)
Hemoglobin: 9.3 g/dL — ABNORMAL LOW (ref 12.0–15.0)
MCH: 27.6 pg (ref 26.0–34.0)
MCHC: 30.1 g/dL (ref 30.0–36.0)
MCV: 91.7 fL (ref 80.0–100.0)
Platelets: 262 10*3/uL (ref 150–400)
RBC: 3.37 MIL/uL — ABNORMAL LOW (ref 3.87–5.11)
RDW: 17.4 % — ABNORMAL HIGH (ref 11.5–15.5)
WBC: 11.6 10*3/uL — ABNORMAL HIGH (ref 4.0–10.5)
nRBC: 0.3 % — ABNORMAL HIGH (ref 0.0–0.2)

## 2021-11-08 LAB — CALCIUM, IONIZED: Calcium, Ionized, Serum: 7.2 mg/dL — ABNORMAL HIGH (ref 4.5–5.6)

## 2021-11-08 LAB — VANCOMYCIN, TROUGH: Vancomycin Tr: 23 ug/mL (ref 15–20)

## 2021-11-08 NOTE — Progress Notes (Signed)
?[name]  ?ROUNDING NOTE  ? ?Subjective:  ?The patient will be due for hemodialysis treatment later this AM. ?Has been tolerating treatments well. ? ?Objective:  ?Vital signs in last 24 hours:  ?Temperature 98 pulse 76 respiration 16 blood pressure 118/60 ? ?Physical Exam: ?General: Chronically ill-appearing  ?Head: Mild facial edema, oral mucosa moist  ?Eyes: Bilateral ptosis  ?Neck: Supple  ?Lungs:  Clear to auscultation, normal effort  ?Heart: S1S2 no rubs  ?Abdomen:  Soft, nontender, bowel sounds present, PEG tube  ?Extremities: Right AKA, diminutive left lower extremity, right upper extremity swelling  ?Neurologic: Awake, alert, following commands  ?Skin: No acute rash  ?Access: Left IJ PermCath, right upper extremity AV access  ? ? ?Basic Metabolic Panel: ?Recent Labs  ?Lab 11/01/21 ?0717 11/03/21 ?0998 11/06/21 ?3382 11/07/21 ?5053 11/08/21 ?9767  ?NA 128* 136 134*  --  133*  ?K 3.8 4.0 4.0  --  4.7  ?CL 93* 100 97*  --  95*  ?CO2 28 29 28   --  28  ?GLUCOSE 64* 196* 117*  --  83  ?BUN 30* 32* 56*  --  44*  ?CREATININE 4.42* 4.08* 5.36*  --  4.36*  ?CALCIUM 10.9* 11.5* 13.0*  --  13.1*  ?PHOS 4.6 4.2 3.9 2.6 2.9  ? ? ? ?Liver Function Tests: ?Recent Labs  ?Lab 11/01/21 ?0717 11/03/21 ?3419 11/06/21 ?0734 11/08/21 ?3790  ?ALBUMIN 2.1* 2.2* 1.9* 1.8*  ? ? ?No results for input(s): LIPASE, AMYLASE in the last 168 hours. ?No results for input(s): AMMONIA in the last 168 hours. ? ?CBC: ?Recent Labs  ?Lab 11/02/21 ?0717 11/03/21 ?2409 11/06/21 ?0734 11/08/21 ?7353  ?WBC 19.2* 17.6* 12.6* 11.6*  ?HGB 9.2* 9.8* 9.3* 9.3*  ?HCT 29.4* 32.7* 29.7* 30.9*  ?MCV 90.2 92.6 91.1 91.7  ?PLT 275 322 290 262  ? ? ? ?Cardiac Enzymes: ?No results for input(s): CKTOTAL, CKMB, CKMBINDEX, TROPONINI in the last 168 hours. ? ?BNP: ?Invalid input(s): POCBNP ? ?CBG: ?No results for input(s): GLUCAP in the last 168 hours. ? ? ?Microbiology: ?Results for orders placed or performed during the hospital encounter of 10/25/21   ?Culture, blood (routine x 2)     Status: None  ? Collection Time: 11/02/21  7:17 AM  ? Specimen: Left Antecubital; Blood  ?Result Value Ref Range Status  ? Specimen Description LEFT ANTECUBITAL  Final  ? Special Requests   Final  ?  BOTTLES DRAWN AEROBIC AND ANAEROBIC Blood Culture adequate volume  ? Culture   Final  ?  NO GROWTH 5 DAYS ?Performed at [facility], [place] [address], [place], [place] [address] ?  ? Report Status 11/07/2021 FINAL  Final  ?Culture, blood (routine x 2)     Status: None  ? Collection Time: 11/02/21  7:18 AM  ? Specimen: BLOOD LEFT ARM  ?Result Value Ref Range Status  ? Specimen Description BLOOD LEFT ARM  Final  ? Special Requests   Final  ?  BOTTLES DRAWN AEROBIC AND ANAEROBIC Blood Culture adequate volume  ? Culture   Final  ?  NO GROWTH 5 DAYS ?Performed at [facility], [place] [address], [place], [place] [address] ?  ? Report Status 11/07/2021 FINAL  Final  ? ? ?Coagulation Studies: ?No results for input(s): LABPROT, INR in the last 72 hours. ? ?Urinalysis: ?No results for input(s): COLORURINE, LABSPEC, [place], GLUCOSEU, HGBUR, BILIRUBINUR, KETONESUR, PROTEINUR, UROBILINOGEN, NITRITE, LEUKOCYTESUR in the last 72 hours. ? ?Invalid input(s): APPERANCEUR  ? ? ?  Imaging: ?No results found. ? ? ?Medications:  ? ? ? ? ? ?Assessment/ Plan:  ?41 y.o. 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 ? ?1.  ESRD on HD.  Patient due for hemodialysis treatment today.  Orders have been prepared. ? ?2.  Anemia of chronic Walker disease.   Hemoglobin 9.3 at last check.  Continue Retacrit 10,000 units with dialysis treatments. ? ?3.  Secondary hyperparathyroidism.  Phosphorus at target at 2.9.  We will continue to monitor. ? ?4.  Hypotension.  Maintain midodrine 5 mg 3 times daily. ? ? LOS: 0 ?[name] ?3/29/20237:16 AM ? ? ?

## 2021-11-09 LAB — RENAL FUNCTION PANEL
Albumin: 2 g/dL — ABNORMAL LOW (ref 3.5–5.0)
Anion gap: 8 (ref 5–15)
BUN: 31 mg/dL — ABNORMAL HIGH (ref 6–20)
CO2: 30 mmol/L (ref 22–32)
Calcium: 12.6 mg/dL — ABNORMAL HIGH (ref 8.9–10.3)
Chloride: 95 mmol/L — ABNORMAL LOW (ref 98–111)
Creatinine, Ser: 3.23 mg/dL — ABNORMAL HIGH (ref 0.44–1.00)
GFR, Estimated: 18 mL/min — ABNORMAL LOW (ref >60)
Glucose, Bld: 124 mg/dL — ABNORMAL HIGH (ref 70–99)
Phosphorus: 2.3 mg/dL — ABNORMAL LOW (ref 2.5–4.6)
Potassium: 3.9 mmol/L (ref 3.5–5.1)
Sodium: 133 mmol/L — ABNORMAL LOW (ref 135–145)

## 2021-11-09 LAB — MAGNESIUM: Magnesium: 2.5 mg/dL — ABNORMAL HIGH (ref 1.7–2.4)

## 2021-11-09 NOTE — Progress Notes (Signed)
Patient is s/p G tube placement on 3/20.  ? ?Patient seen in bedside, three T tacks remains under the bumper.  ?T tacks removed w/o difficulty.  ?Patient tolerated procedure well. ? ?Tera Mater PA-C ?11/09/2021 2:51 PM ? ?  ? ?

## 2021-11-10 NOTE — Progress Notes (Signed)
?[facility]  ?ROUNDING NOTE  ? ?Subjective:  ?Patient resting comfortably in bed at the moment. ?No acute complaints. ?She will be due for hemodialysis treatment later today. ? ?Objective:  ?Vital signs in last 24 hours:  ?Temperature 96.9 pulse 86 respirations 22 blood pressure 160/90 ? ?Physical Exam: ?General: Chronically ill-appearing  ?Head: Mild facial edema, oral mucosa moist  ?Eyes: Bilateral ptosis  ?Neck: Supple  ?Lungs:  Clear to auscultation, normal effort  ?Heart: S1S2 no rubs  ?Abdomen:  Soft, nontender, bowel sounds present, PEG tube  ?Extremities: Right AKA, diminutive left lower extremity, right upper extremity swelling  ?Neurologic: Awake, alert, following commands  ?Skin: No acute rash  ?Access: Left IJ PermCath, right upper extremity AV access  ? ? ?Basic Metabolic Panel: ?Recent Labs  ?Lab 11/06/21 ?0734 11/07/21 ?0607 11/08/21 ?9629 11/09/21 ?1431  ?NA 134*  --  133* 133*  ?K 4.0  --  4.7 3.9  ?CL 97*  --  95* 95*  ?CO2 28  --  28 30  ?GLUCOSE 117*  --  83 124*  ?BUN 56*  --  44* 31*  ?CREATININE 5.36*  --  4.36* 3.23*  ?CALCIUM 13.0*  --  13.1* 12.6*  ?MG  --   --   --  2.5*  ?PHOS 3.9 2.6 2.9 2.3*  ? ? ? ?Liver Function Tests: ?Recent Labs  ?Lab 11/06/21 ?0734 11/08/21 ?5284 11/09/21 ?1431  ?ALBUMIN 1.9* 1.8* 2.0*  ? ? ?No results for input(s): LIPASE, AMYLASE in the last 168 hours. ?No results for input(s): AMMONIA in the last 168 hours. ? ?CBC: ?Recent Labs  ?Lab 11/06/21 ?0734 11/08/21 ?1324  ?WBC 12.6* 11.6*  ?HGB 9.3* 9.3*  ?HCT 29.7* 30.9*  ?MCV 91.1 91.7  ?PLT 290 262  ? ? ? ?Cardiac Enzymes: ?No results for input(s): CKTOTAL, CKMB, CKMBINDEX, TROPONINI in the last 168 hours. ? ?BNP: ?Invalid input(s): POCBNP ? ?CBG: ?No results for input(s): GLUCAP in the last 168 hours. ? ? ?Microbiology: ?Results for orders placed or performed during the hospital encounter of 10/25/21  ?Culture, blood (routine x 2)     Status: None  ? Collection Time: 11/02/21  7:17 AM  ? Specimen:  Left Antecubital; Blood  ?Result Value Ref Range Status  ? Specimen Description LEFT ANTECUBITAL  Final  ? Special Requests   Final  ?  BOTTLES DRAWN AEROBIC AND ANAEROBIC Blood Culture adequate volume  ? Culture   Final  ?  NO GROWTH 5 DAYS ?Performed at [facility], [place] [address] [place], [place] [address] ?  ? Report Status 11/07/2021 FINAL  Final  ?Culture, blood (routine x 2)     Status: None  ? Collection Time: 11/02/21  7:18 AM  ? Specimen: BLOOD LEFT ARM  ?Result Value Ref Range Status  ? Specimen Description BLOOD LEFT ARM  Final  ? Special Requests   Final  ?  BOTTLES DRAWN AEROBIC AND ANAEROBIC Blood Culture adequate volume  ? Culture   Final  ?  NO GROWTH 5 DAYS ?Performed at [facility], [place] [address], [place], [place] [address] ?  ? Report Status 11/07/2021 FINAL  Final  ? ? ?Coagulation Studies: ?No results for input(s): LABPROT, INR in the last 72 hours. ? ?Urinalysis: ?No results for input(s): COLORURINE, LABSPEC, [place], GLUCOSEU, HGBUR, BILIRUBINUR, KETONESUR, PROTEINUR, UROBILINOGEN, NITRITE, LEUKOCYTESUR in the last 72 hours. ? ?Invalid input(s): APPERANCEUR  ? ? ?Imaging: ?No results found. ? ? ?Medications:  ? ? ? ? ? ?  Assessment/ Plan:  ?41 y.o. 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 ? ?1.  ESRD on HD.  We will continue to perform hemodialysis using her IJ PermCath.  Continue rest hemodialysis access in right upper extremity.  This will need follow-up as an outpatient.  We will plan for hemodialysis treatment later  today. ? ?2.  Anemia of chronic kidney disease.  Maintain the patient on Retacrit 10,000 units IV with dialysis treatments. ? ?3.  Secondary hyperparathyroidism.  Phosphorus was 2.3 at last check.  Continue periodically monitor. ? ?4.  Hypotension.  Maintain midodrine 5 mg 3 times daily. ? ? LOS: 0 ?Ilanna Deihl ?3/31/20239:25 AM ? ? ?

## 2021-11-11 LAB — CBC
HCT: 27.9 % — ABNORMAL LOW (ref 36.0–46.0)
Hemoglobin: 8.7 g/dL — ABNORMAL LOW (ref 12.0–15.0)
MCH: 28.5 pg (ref 26.0–34.0)
MCHC: 31.2 g/dL (ref 30.0–36.0)
MCV: 91.5 fL (ref 80.0–100.0)
Platelets: 263 10*3/uL (ref 150–400)
RBC: 3.05 MIL/uL — ABNORMAL LOW (ref 3.87–5.11)
RDW: 18 % — ABNORMAL HIGH (ref 11.5–15.5)
WBC: 13.3 10*3/uL — ABNORMAL HIGH (ref 4.0–10.5)
nRBC: 0.3 % — ABNORMAL HIGH (ref 0.0–0.2)

## 2021-11-11 LAB — RENAL FUNCTION PANEL
Albumin: 1.8 g/dL — ABNORMAL LOW (ref 3.5–5.0)
Anion gap: 6 (ref 5–15)
BUN: 23 mg/dL — ABNORMAL HIGH (ref 6–20)
CO2: 30 mmol/L (ref 22–32)
Calcium: 11.8 mg/dL — ABNORMAL HIGH (ref 8.9–10.3)
Chloride: 96 mmol/L — ABNORMAL LOW (ref 98–111)
Creatinine, Ser: 2.58 mg/dL — ABNORMAL HIGH (ref 0.44–1.00)
GFR, Estimated: 24 mL/min — ABNORMAL LOW (ref >60)
Glucose, Bld: 179 mg/dL — ABNORMAL HIGH (ref 70–99)
Phosphorus: 2 mg/dL — ABNORMAL LOW (ref 2.5–4.6)
Potassium: 3.9 mmol/L (ref 3.5–5.1)
Sodium: 132 mmol/L — ABNORMAL LOW (ref 135–145)

## 2021-11-12 LAB — CBC
HCT: 28.7 % — ABNORMAL LOW (ref 36.0–46.0)
Hemoglobin: 8.8 g/dL — ABNORMAL LOW (ref 12.0–15.0)
MCH: 27.8 pg (ref 26.0–34.0)
MCHC: 30.7 g/dL (ref 30.0–36.0)
MCV: 90.5 fL (ref 80.0–100.0)
Platelets: 247 10*3/uL (ref 150–400)
RBC: 3.17 MIL/uL — ABNORMAL LOW (ref 3.87–5.11)
RDW: 18.3 % — ABNORMAL HIGH (ref 11.5–15.5)
WBC: 13.2 10*3/uL — ABNORMAL HIGH (ref 4.0–10.5)
nRBC: 0.2 % (ref 0.0–0.2)

## 2021-11-12 LAB — BASIC METABOLIC PANEL
Anion gap: 7 (ref 5–15)
BUN: 38 mg/dL — ABNORMAL HIGH (ref 6–20)
CO2: 28 mmol/L (ref 22–32)
Calcium: 13 mg/dL — ABNORMAL HIGH (ref 8.9–10.3)
Chloride: 96 mmol/L — ABNORMAL LOW (ref 98–111)
Creatinine, Ser: 3.75 mg/dL — ABNORMAL HIGH (ref 0.44–1.00)
GFR, Estimated: 15 mL/min — ABNORMAL LOW (ref >60)
Glucose, Bld: 138 mg/dL — ABNORMAL HIGH (ref 70–99)
Potassium: 4.9 mmol/L (ref 3.5–5.1)
Sodium: 131 mmol/L — ABNORMAL LOW (ref 135–145)

## 2021-11-12 LAB — CALCIUM, IONIZED: Calcium, Ionized, Serum: 6.9 mg/dL — ABNORMAL HIGH (ref 4.5–5.6)

## 2021-11-13 ENCOUNTER — Encounter: Payer: Medicare Other | Admitting: Surgery

## 2021-11-13 LAB — RENAL FUNCTION PANEL
Albumin: 1.9 g/dL — ABNORMAL LOW (ref 3.5–5.0)
Anion gap: 7 (ref 5–15)
BUN: 48 mg/dL — ABNORMAL HIGH (ref 6–20)
CO2: 28 mmol/L (ref 22–32)
Calcium: 13.2 mg/dL (ref 8.9–10.3)
Chloride: 94 mmol/L — ABNORMAL LOW (ref 98–111)
Creatinine, Ser: 4.23 mg/dL — ABNORMAL HIGH (ref 0.44–1.00)
GFR, Estimated: 13 mL/min — ABNORMAL LOW (ref >60)
Glucose, Bld: 122 mg/dL — ABNORMAL HIGH (ref 70–99)
Phosphorus: 3.9 mg/dL (ref 2.5–4.6)
Potassium: 4.9 mmol/L (ref 3.5–5.1)
Sodium: 129 mmol/L — ABNORMAL LOW (ref 135–145)

## 2021-11-13 LAB — CBC
HCT: 28.6 % — ABNORMAL LOW (ref 36.0–46.0)
Hemoglobin: 8.6 g/dL — ABNORMAL LOW (ref 12.0–15.0)
MCH: 27.7 pg (ref 26.0–34.0)
MCHC: 30.1 g/dL (ref 30.0–36.0)
MCV: 92 fL (ref 80.0–100.0)
Platelets: 248 10*3/uL (ref 150–400)
RBC: 3.11 MIL/uL — ABNORMAL LOW (ref 3.87–5.11)
RDW: 18.7 % — ABNORMAL HIGH (ref 11.5–15.5)
WBC: 11.5 10*3/uL — ABNORMAL HIGH (ref 4.0–10.5)
nRBC: 0.2 % (ref 0.0–0.2)

## 2021-11-13 NOTE — Progress Notes (Signed)
?[facility]  ?ROUNDING NOTE  ? ?Subjective:  ?Patient seen and evaluated at bedside this AM. ?No acute complaints. ?Patient undergo hemodialysis treatment today. ?Blood pressure was elevated today. ? ?Objective:  ?Vital signs in last 24 hours:  ?Temperature 97 pulse 92 respirations 13 blood pressure 171/75 ? ?Physical Exam: ?General: Chronically ill-appearing  ?Head: Mild facial edema, oral mucosa moist  ?Eyes: Bilateral ptosis  ?Neck: Supple  ?Lungs:  Clear to auscultation, normal effort  ?Heart: S1S2 no rubs  ?Abdomen:  Soft, nontender, bowel sounds present, PEG tube  ?Extremities: Right AKA, diminutive left lower extremity, right upper extremity swelling  ?Neurologic: Awake, alert, following commands  ?Skin: No acute rash  ?Access: Left IJ PermCath, right upper extremity AV access  ? ? ?Basic Metabolic Panel: ?Recent Labs  ?Lab 11/07/21 ?0607 11/08/21 ?2263 11/08/21 ?3354 11/09/21 ?1431 11/11/21 ?0357 11/12/21 ?1107 11/13/21 ?0337  ?NA  --  133*  --  133* 132* 131* 129*  ?K  --  4.7  --  3.9 3.9 4.9 4.9  ?CL  --  95*  --  95* 96* 96* 94*  ?CO2  --  28  --  30 30 28 28   ?GLUCOSE  --  83  --  124* 179* 138* 122*  ?BUN  --  44*  --  31* 23* 38* 48*  ?CREATININE  --  4.36*  --  3.23* 2.58* 3.75* 4.23*  ?CALCIUM  --  13.1*   < > 12.6* 11.8* 13.0* 13.2*  ?MG  --   --   --  2.5*  --   --   --   ?PHOS 2.6 2.9  --  2.3* 2.0*  --  3.9  ? < > = values in this interval not displayed.  ? ? ? ?Liver Function Tests: ?Recent Labs  ?Lab 11/08/21 ?5625 11/09/21 ?1431 11/11/21 ?0357 11/13/21 ?0337  ?ALBUMIN 1.8* 2.0* 1.8* 1.9*  ? ? ?No results for input(s): LIPASE, AMYLASE in the last 168 hours. ?No results for input(s): AMMONIA in the last 168 hours. ? ?CBC: ?Recent Labs  ?Lab 11/08/21 ?6389 11/11/21 ?0357 11/12/21 ?1107 11/13/21 ?0337  ?WBC 11.6* 13.3* 13.2* 11.5*  ?HGB 9.3* 8.7* 8.8* 8.6*  ?HCT 30.9* 27.9* 28.7* 28.6*  ?MCV 91.7 91.5 90.5 92.0  ?PLT 262 263 247 248  ? ? ? ?Cardiac Enzymes: ?No results for input(s):  CKTOTAL, CKMB, CKMBINDEX, TROPONINI in the last 168 hours. ? ?BNP: ?Invalid input(s): POCBNP ? ?CBG: ?No results for input(s): GLUCAP in the last 168 hours. ? ? ?Microbiology: ?Results for orders placed or performed during the hospital encounter of 10/25/21  ?Culture, blood (routine x 2)     Status: None  ? Collection Time: 11/02/21  7:17 AM  ? Specimen: Left Antecubital; Blood  ?Result Value Ref Range Status  ? Specimen Description LEFT ANTECUBITAL  Final  ? Special Requests   Final  ?  BOTTLES DRAWN AEROBIC AND ANAEROBIC Blood Culture adequate volume  ? Culture   Final  ?  NO GROWTH 5 DAYS ?Performed at [facility], [place] [address], [place], [place] [address] ?  ? Report Status 11/07/2021 FINAL  Final  ?Culture, blood (routine x 2)     Status: None  ? Collection Time: 11/02/21  7:18 AM  ? Specimen: BLOOD LEFT ARM  ?Result Value Ref Range Status  ? Specimen Description BLOOD LEFT ARM  Final  ? Special Requests   Final  ?  BOTTLES DRAWN AEROBIC AND ANAEROBIC Blood Culture adequate volume  ?  Culture   Final  ?  NO GROWTH 5 DAYS ?Performed at [facility], [place] [address] [place], [place] [address] ?  ? Report Status 11/07/2021 FINAL  Final  ? ? ?Coagulation Studies: ?No results for input(s): LABPROT, INR in the last 72 hours. ? ?Urinalysis: ?No results for input(s): COLORURINE, LABSPEC, [place], GLUCOSEU, HGBUR, BILIRUBINUR, KETONESUR, PROTEINUR, UROBILINOGEN, NITRITE, LEUKOCYTESUR in the last 72 hours. ? ?Invalid input(s): APPERANCEUR  ? ? ?Imaging: ?No results found. ? ? ?Medications:  ? ? ? ? ? ?Assessment/ Plan:  ?41 y.o. 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 ? ?1.  ESRD on HD.  Patient due for hemodialysis treatment today.  Orders prepared. ? ?2.  Anemia of chronic kidney disease.  Hemoglobin currently 8.6.  Maintain the patient on Retacrit 10,000 units IV with dialysis. ? ?3.  Secondary hyperparathyroidism.  Phosphorus 3.9 this a.m. and acceptable. ? ?4.  Hypotension.  Patient taken off of midodrine.  Blood pressure actually a bit high now. ? ? LOS: 0 ?Daesha Insco ?4/3/20238:16 AM ? ? ?

## 2021-11-14 LAB — RENAL FUNCTION PANEL
Albumin: 2.2 g/dL — ABNORMAL LOW (ref 3.5–5.0)
Anion gap: 9 (ref 5–15)
BUN: 59 mg/dL — ABNORMAL HIGH (ref 6–20)
CO2: 29 mmol/L (ref 22–32)
Calcium: 13.6 mg/dL (ref 8.9–10.3)
Chloride: 93 mmol/L — ABNORMAL LOW (ref 98–111)
Creatinine, Ser: 4.72 mg/dL — ABNORMAL HIGH (ref 0.44–1.00)
GFR, Estimated: 11 mL/min — ABNORMAL LOW (ref >60)
Glucose, Bld: 136 mg/dL — ABNORMAL HIGH (ref 70–99)
Phosphorus: 3.7 mg/dL (ref 2.5–4.6)
Potassium: 4.7 mmol/L (ref 3.5–5.1)
Sodium: 131 mmol/L — ABNORMAL LOW (ref 135–145)

## 2021-11-14 LAB — MAGNESIUM: Magnesium: 2.6 mg/dL — ABNORMAL HIGH (ref 1.7–2.4)

## 2021-11-15 LAB — CBC
HCT: 30.4 % — ABNORMAL LOW (ref 36.0–46.0)
Hemoglobin: 9.5 g/dL — ABNORMAL LOW (ref 12.0–15.0)
MCH: 28.4 pg (ref 26.0–34.0)
MCHC: 31.3 g/dL (ref 30.0–36.0)
MCV: 90.7 fL (ref 80.0–100.0)
Platelets: 287 10*3/uL (ref 150–400)
RBC: 3.35 MIL/uL — ABNORMAL LOW (ref 3.87–5.11)
RDW: 18.8 % — ABNORMAL HIGH (ref 11.5–15.5)
WBC: 11.8 10*3/uL — ABNORMAL HIGH (ref 4.0–10.5)
nRBC: 0 % (ref 0.0–0.2)

## 2021-11-15 LAB — RENAL FUNCTION PANEL
Albumin: 2.3 g/dL — ABNORMAL LOW (ref 3.5–5.0)
Anion gap: 9 (ref 5–15)
BUN: 72 mg/dL — ABNORMAL HIGH (ref 6–20)
CO2: 28 mmol/L (ref 22–32)
Calcium: 14 mg/dL (ref 8.9–10.3)
Chloride: 92 mmol/L — ABNORMAL LOW (ref 98–111)
Creatinine, Ser: 5.61 mg/dL — ABNORMAL HIGH (ref 0.44–1.00)
GFR, Estimated: 9 mL/min — ABNORMAL LOW (ref >60)
Glucose, Bld: 128 mg/dL — ABNORMAL HIGH (ref 70–99)
Phosphorus: 4.4 mg/dL (ref 2.5–4.6)
Potassium: 5.1 mmol/L (ref 3.5–5.1)
Sodium: 129 mmol/L — ABNORMAL LOW (ref 135–145)

## 2021-11-15 LAB — MAGNESIUM: Magnesium: 2.8 mg/dL — ABNORMAL HIGH (ref 1.7–2.4)

## 2021-11-15 LAB — PTH, INTACT AND CALCIUM
Calcium, Total (PTH): 12.1 mg/dL — ABNORMAL HIGH (ref 8.7–10.2)
PTH: 31 pg/mL (ref 15–65)

## 2021-11-15 LAB — CALCIUM, IONIZED: Calcium, Ionized, Serum: 7.7 mg/dL — ABNORMAL HIGH (ref 4.5–5.6)

## 2021-11-15 NOTE — Progress Notes (Signed)
?[facility]  ?ROUNDING NOTE  ? ?Subjective:  ?Patient seen and evaluated during hemodialysis treatment. ?Was given zoledronic acid yesterday. ?Calcium still high at 14. ? ?Objective:  ?Vital signs in last 24 hours:  ?Temperature 96.2 pulse 89 respirations 20 blood pressure 180/89 ? ?Physical Exam: ?General: Chronically ill-appearing  ?Head: Mild facial edema, oral mucosa moist  ?Eyes: Bilateral ptosis  ?Neck: Supple  ?Lungs:  Clear to auscultation, normal effort  ?Heart: S1S2 no rubs  ?Abdomen:  Soft, nontender, bowel sounds present, PEG tube  ?Extremities: Right AKA, diminutive left lower extremity, right upper extremity swelling  ?Neurologic: Awake, alert, following commands  ?Skin: No acute rash  ?Access: Left IJ PermCath, right upper extremity AV access  ? ? ?Basic Metabolic Panel: ?Recent Labs  ?Lab 11/09/21 ?1431 11/11/21 ?0357 11/12/21 ?1107 11/13/21 ?9449 11/14/21 ?6759 11/15/21 ?1638  ?NA 133* 132* 131* 129* 131* 129*  ?K 3.9 3.9 4.9 4.9 4.7 5.1  ?CL 95* 96* 96* 94* 93* 92*  ?CO2 30 30 28 28 29 28   ?GLUCOSE 124* 179* 138* 122* 136* 128*  ?BUN 31* 23* 38* 48* 59* 72*  ?CREATININE 3.23* 2.58* 3.75* 4.23* 4.72* 5.61*  ?CALCIUM 12.6* 11.8* 13.0* 13.2* 13.6* 14.0*  ?MG 2.5*  --   --   --  2.6* 2.8*  ?PHOS 2.3* 2.0*  --  3.9 3.7 4.4  ? ? ? ?Liver Function Tests: ?Recent Labs  ?Lab 11/09/21 ?1431 11/11/21 ?0357 11/13/21 ?4665 11/14/21 ?9935 11/15/21 ?7017  ?ALBUMIN 2.0* 1.8* 1.9* 2.2* 2.3*  ? ? ?No results for input(s): LIPASE, AMYLASE in the last 168 hours. ?No results for input(s): AMMONIA in the last 168 hours. ? ?CBC: ?Recent Labs  ?Lab 11/11/21 ?7939 11/12/21 ?1107 11/13/21 ?0300 11/15/21 ?9233  ?WBC 13.3* 13.2* 11.5* 11.8*  ?HGB 8.7* 8.8* 8.6* 9.5*  ?HCT 27.9* 28.7* 28.6* 30.4*  ?MCV 91.5 90.5 92.0 90.7  ?PLT 263 247 248 287  ? ? ? ?Cardiac Enzymes: ?No results for input(s): CKTOTAL, CKMB, CKMBINDEX, TROPONINI in the last 168 hours. ? ?BNP: ?Invalid input(s): POCBNP ? ?CBG: ?No results for  input(s): GLUCAP in the last 168 hours. ? ? ?Microbiology: ?Results for orders placed or performed during the hospital encounter of 10/25/21  ?Culture, blood (routine x 2)     Status: None  ? Collection Time: 11/02/21  7:17 AM  ? Specimen: Left Antecubital; Blood  ?Result Value Ref Range Status  ? Specimen Description LEFT ANTECUBITAL  Final  ? Special Requests   Final  ?  BOTTLES DRAWN AEROBIC AND ANAEROBIC Blood Culture adequate volume  ? Culture   Final  ?  NO GROWTH 5 DAYS ?Performed at [facility], [place] [address] [place], [place] [address] ?  ? Report Status 11/07/2021 FINAL  Final  ?Culture, blood (routine x 2)     Status: None  ? Collection Time: 11/02/21  7:18 AM  ? Specimen: BLOOD LEFT ARM  ?Result Value Ref Range Status  ? Specimen Description BLOOD LEFT ARM  Final  ? Special Requests   Final  ?  BOTTLES DRAWN AEROBIC AND ANAEROBIC Blood Culture adequate volume  ? Culture   Final  ?  NO GROWTH 5 DAYS ?Performed at [facility], [place] [address], [place], [place] [address] ?  ? Report Status 11/07/2021 FINAL  Final  ? ? ?Coagulation Studies: ?No results for input(s): LABPROT, INR in the last 72 hours. ? ?Urinalysis: ?No results for input(s): COLORURINE, LABSPEC, [place], [place], [place], [place], [place], [place],  UROBILINOGEN, NITRITE, LEUKOCYTESUR in the last 72 hours. ? ?Invalid input(s): APPERANCEUR  ? ? ?Imaging: ?No results found. ? ? ?Medications:  ? ? ? ? ? ?Assessment/ Plan:  ?41 y.o. 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 ? ?1.  ESRD on HD.  Patient seen and evaluated during hemodialysis treatment today.  Calcium bath decreased to 2 calcium. ? ?2.  Anemia of chronic kidney disease.  Hemoglobin up to 9.5.  Continue Retacrit 10,000 units IV with dialysis. ? ?3.  Secondary hyperparathyroidism/hypercalcemia.  Calcium still high at 14.0.  Zoledronic acid administered 11/14/2021.  Phosphorus acceptable at 4.4. ? ?4.  Hypotension.  Resolved, and patient off of midodrine. ? ? LOS: 0 ?[name] ?4/5/20233:13 PM ? ? ?

## 2021-11-16 ENCOUNTER — Other Ambulatory Visit (HOSPITAL_COMMUNITY): Payer: Medicare Other

## 2021-11-16 MED ORDER — DIATRIZOATE MEGLUMINE & SODIUM 66-10 % PO SOLN
ORAL | Status: AC
  Administered 2021-11-16: 30 mL
  Filled 2021-11-16: qty 30

## 2021-11-16 MED ORDER — DIATRIZOATE MEGLUMINE & SODIUM 66-10 % PO SOLN: 30.0000 mL | Freq: Once | ORAL | Status: DC

## 2021-11-17 LAB — CBC
HCT: 25.7 % — ABNORMAL LOW (ref 36.0–46.0)
Hemoglobin: 7.8 g/dL — ABNORMAL LOW (ref 12.0–15.0)
MCH: 27.9 pg (ref 26.0–34.0)
MCHC: 30.4 g/dL (ref 30.0–36.0)
MCV: 91.8 fL (ref 80.0–100.0)
Platelets: 244 10*3/uL (ref 150–400)
RBC: 2.8 MIL/uL — ABNORMAL LOW (ref 3.87–5.11)
RDW: 18.4 % — ABNORMAL HIGH (ref 11.5–15.5)
WBC: 11.3 10*3/uL — ABNORMAL HIGH (ref 4.0–10.5)
nRBC: 0 % (ref 0.0–0.2)

## 2021-11-17 LAB — RENAL FUNCTION PANEL
Albumin: 2 g/dL — ABNORMAL LOW (ref 3.5–5.0)
Anion gap: 8 (ref 5–15)
BUN: 43 mg/dL — ABNORMAL HIGH (ref 6–20)
CO2: 28 mmol/L (ref 22–32)
Calcium: 10.1 mg/dL (ref 8.9–10.3)
Chloride: 96 mmol/L — ABNORMAL LOW (ref 98–111)
Creatinine, Ser: 4.05 mg/dL — ABNORMAL HIGH (ref 0.44–1.00)
GFR, Estimated: 14 mL/min — ABNORMAL LOW (ref >60)
Glucose, Bld: 127 mg/dL — ABNORMAL HIGH (ref 70–99)
Phosphorus: 2.5 mg/dL (ref 2.5–4.6)
Potassium: 4.1 mmol/L (ref 3.5–5.1)
Sodium: 132 mmol/L — ABNORMAL LOW (ref 135–145)

## 2021-11-17 LAB — CBC WITH DIFFERENTIAL/PLATELET
Abs Immature Granulocytes: 0.03 10*3/uL (ref 0.00–0.07)
Basophils Absolute: 0.1 10*3/uL (ref 0.0–0.1)
Basophils Relative: 1 %
Eosinophils Absolute: 0.6 10*3/uL — ABNORMAL HIGH (ref 0.0–0.5)
Eosinophils Relative: 6 %
HCT: 27 % — ABNORMAL LOW (ref 36.0–46.0)
Hemoglobin: 8.1 g/dL — ABNORMAL LOW (ref 12.0–15.0)
Immature Granulocytes: 0 %
Lymphocytes Relative: 16 %
Lymphs Abs: 1.7 10*3/uL (ref 0.7–4.0)
MCH: 27.6 pg (ref 26.0–34.0)
MCHC: 30 g/dL (ref 30.0–36.0)
MCV: 91.8 fL (ref 80.0–100.0)
Monocytes Absolute: 0.9 10*3/uL (ref 0.1–1.0)
Monocytes Relative: 8 %
Neutro Abs: 7 10*3/uL (ref 1.7–7.7)
Neutrophils Relative %: 69 %
Platelets: 266 10*3/uL (ref 150–400)
RBC: 2.94 MIL/uL — ABNORMAL LOW (ref 3.87–5.11)
RDW: 18.3 % — ABNORMAL HIGH (ref 11.5–15.5)
WBC: 10.2 10*3/uL (ref 4.0–10.5)
nRBC: 0 % (ref 0.0–0.2)

## 2021-11-17 NOTE — Progress Notes (Signed)
?[facility]  ?ROUNDING NOTE  ? ?Subjective:  ?Patient seen and evaluated during hemodialysis treatment today. ?Serum calcium down to 10.1. ?Tolerating dialysis treatment well. ? ?Objective:  ?Vital signs in last 24 hours:  ?Temperature 98.6 pulse 91 respirations 18 blood pressure 102/40 ? ?Physical Exam: ?General: Chronically ill-appearing  ?Head: Mild facial edema, oral mucosa moist  ?Eyes: Bilateral ptosis  ?Neck: Supple  ?Lungs:  Clear to auscultation, normal effort  ?Heart: S1S2 no rubs  ?Abdomen:  Soft, nontender, bowel sounds present, PEG tube  ?Extremities: Right AKA, diminutive left lower extremity, right upper extremity swelling  ?Neurologic: Awake, alert, following commands  ?Skin: No acute rash  ?Access: Left IJ PermCath, right upper extremity AV access  ? ? ?Basic Metabolic Panel: ?Recent Labs  ?Lab 11/11/21 ?4650 11/12/21 ?1107 11/13/21 ?3546 11/13/21 ?2026 11/14/21 ?5681 11/15/21 ?2751 11/17/21 ?7001  ?NA 132* 131* 129*  --  131* 129* 132*  ?K 3.9 4.9 4.9  --  4.7 5.1 4.1  ?CL 96* 96* 94*  --  93* 92* 96*  ?CO2 30 28 28   --  29 28 28   ?GLUCOSE 179* 138* 122*  --  136* 128* 127*  ?BUN 23* 38* 48*  --  59* 72* 43*  ?CREATININE 2.58* 3.75* 4.23*  --  4.72* 5.61* 4.05*  ?CALCIUM 11.8* 13.0* 13.2*   < > 13.6* 14.0* 10.1  ?MG  --   --   --   --  2.6* 2.8*  --   ?PHOS 2.0*  --  3.9  --  3.7 4.4 2.5  ? < > = values in this interval not displayed.  ? ? ? ?Liver Function Tests: ?Recent Labs  ?Lab 11/11/21 ?0357 11/13/21 ?7494 11/14/21 ?4967 11/15/21 ?5916 11/17/21 ?3846  ?ALBUMIN 1.8* 1.9* 2.2* 2.3* 2.0*  ? ? ?No results for input(s): LIPASE, AMYLASE in the last 168 hours. ?No results for input(s): AMMONIA in the last 168 hours. ? ?CBC: ?Recent Labs  ?Lab 11/11/21 ?6599 11/12/21 ?1107 11/13/21 ?3570 11/15/21 ?1779 11/17/21 ?3903  ?WBC 13.3* 13.2* 11.5* 11.8* 11.3*  ?HGB 8.7* 8.8* 8.6* 9.5* 7.8*  ?HCT 27.9* 28.7* 28.6* 30.4* 25.7*  ?MCV 91.5 90.5 92.0 90.7 91.8  ?PLT 263 247 248 287 244   ? ? ? ?Cardiac Enzymes: ?No results for input(s): CKTOTAL, CKMB, CKMBINDEX, TROPONINI in the last 168 hours. ? ?BNP: ?Invalid input(s): POCBNP ? ?CBG: ?No results for input(s): GLUCAP in the last 168 hours. ? ? ?Microbiology: ?Results for orders placed or performed during the hospital encounter of 10/25/21  ?Culture, blood (routine x 2)     Status: None  ? Collection Time: 11/02/21  7:17 AM  ? Specimen: Left Antecubital; Blood  ?Result Value Ref Range Status  ? Specimen Description LEFT ANTECUBITAL  Final  ? Special Requests   Final  ?  BOTTLES DRAWN AEROBIC AND ANAEROBIC Blood Culture adequate volume  ? Culture   Final  ?  NO GROWTH 5 DAYS ?Performed at [facility], [place] [address] [place], [place] [address] ?  ? Report Status 11/07/2021 FINAL  Final  ?Culture, blood (routine x 2)     Status: None  ? Collection Time: 11/02/21  7:18 AM  ? Specimen: BLOOD LEFT ARM  ?Result Value Ref Range Status  ? Specimen Description BLOOD LEFT ARM  Final  ? Special Requests   Final  ?  BOTTLES DRAWN AEROBIC AND ANAEROBIC Blood Culture adequate volume  ? Culture   Final  ?  NO GROWTH 5  DAYS ?Performed at [facility], [place] [address] [place], [place] [address] ?  ? Report Status 11/07/2021 FINAL  Final  ? ? ?Coagulation Studies: ?No results for input(s): LABPROT, INR in the last 72 hours. ? ?Urinalysis: ?No results for input(s): COLORURINE, LABSPEC, [place], GLUCOSEU, HGBUR, BILIRUBINUR, KETONESUR, PROTEINUR, UROBILINOGEN, NITRITE, LEUKOCYTESUR in the last 72 hours. ? ?Invalid input(s): APPERANCEUR  ? ? ?Imaging: ?DG ABDOMEN PEG TUBE LOCATION ? ?Result Date: 11/16/2021 ?CLINICAL DATA:  Evaluate gastrostomy tube EXAM: ABDOMEN - 1 VIEW COMPARISON:  10/26/2021 FINDINGS: There is interval removal of enteric tubes. There is interval placement of gastrostomy tube. Contrast injected through the gastrostomy tube is noted in the lumen of stomach and proximal duodenum. There is no demonstrable extravasation of contrast.  IMPRESSION: Tip of gastrostomy appears to be in the lumen of the stomach. Electronically Signed   By: Elmer Picker M.D.   On: 11/16/2021 16:54   ? ? ?Medications:  ? ? ? ? ? ?Assessment/ Plan:  ?41 y.o. 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 ? ?1.  ESRD on HD.  Patient seen and evaluated during hemodialysis treatment.  Tolerating well.  We will plan to complete dialysis treatment today. ? ?2.  Anemia of chronic kidney disease.  Hemoglobin is dropped to 7.8.  Continue Retacrit 10,000 IV with dialysis.  Consider blood transfusion for hemoglobin of 7 or less. ? ?3.  Secondary hyperparathyroidism/hypercalcemia.  Calcium now down to 10.1. ?Zoledronic acid administered 11/14/2021.  Phosphorus at target. ? ?4.  Hypotension.  Blood pressure lower again at 102/40.  May need to consider restarting midodrine if blood pressure drops again. ? ? LOS: 0 ?[name] ?4/7/202311:35 AM ? ? ?

## 2021-11-20 LAB — CBC
HCT: 28.4 % — ABNORMAL LOW (ref 36.0–46.0)
Hemoglobin: 8.5 g/dL — ABNORMAL LOW (ref 12.0–15.0)
MCH: 27.3 pg (ref 26.0–34.0)
MCHC: 29.9 g/dL — ABNORMAL LOW (ref 30.0–36.0)
MCV: 91.3 fL (ref 80.0–100.0)
Platelets: 326 10*3/uL (ref 150–400)
RBC: 3.11 MIL/uL — ABNORMAL LOW (ref 3.87–5.11)
RDW: 17.6 % — ABNORMAL HIGH (ref 11.5–15.5)
WBC: 7.9 10*3/uL (ref 4.0–10.5)
nRBC: 0 % (ref 0.0–0.2)

## 2021-11-20 LAB — RENAL FUNCTION PANEL
Albumin: 2.2 g/dL — ABNORMAL LOW (ref 3.5–5.0)
Anion gap: 8 (ref 5–15)
BUN: 49 mg/dL — ABNORMAL HIGH (ref 6–20)
CO2: 29 mmol/L (ref 22–32)
Calcium: 9.3 mg/dL (ref 8.9–10.3)
Chloride: 95 mmol/L — ABNORMAL LOW (ref 98–111)
Creatinine, Ser: 4.65 mg/dL — ABNORMAL HIGH (ref 0.44–1.00)
GFR, Estimated: 12 mL/min — ABNORMAL LOW (ref >60)
Glucose, Bld: 114 mg/dL — ABNORMAL HIGH (ref 70–99)
Phosphorus: 2.5 mg/dL (ref 2.5–4.6)
Potassium: 4.1 mmol/L (ref 3.5–5.1)
Sodium: 132 mmol/L — ABNORMAL LOW (ref 135–145)

## 2021-11-20 NOTE — Progress Notes (Signed)
?[facility]  ?ROUNDING NOTE  ? ?Subjective:  ?Patient seen and evaluated at bedside. ?Due for hemodialysis treatment this AM. ?Resting comfortably in bed at the moment. ? ?Objective:  ?Vital signs in last 24 hours:  ?Temperature 97.3 pulse 86 respirations 14 blood pressure 134/81 ? ?Physical Exam: ?General: Chronically ill-appearing  ?Head: Mild facial edema, oral mucosa moist  ?Eyes: Bilateral ptosis  ?Neck: Supple  ?Lungs:  Clear to auscultation, normal effort  ?Heart: S1S2 no rubs  ?Abdomen:  Soft, nontender, bowel sounds present, PEG tube  ?Extremities: Right AKA, diminutive left lower extremity, right upper extremity swelling  ?Neurologic: Awake, alert, following commands  ?Skin: No acute rash  ?Access: Left IJ PermCath, right upper extremity AV access  ? ? ?Basic Metabolic Panel: ?Recent Labs  ?Lab 11/14/21 ?6568 11/15/21 ?1275 11/17/21 ?1700 11/20/21 ?0340  ?NA 131* 129* 132* 132*  ?K 4.7 5.1 4.1 4.1  ?CL 93* 92* 96* 95*  ?CO2 29 28 28 29   ?GLUCOSE 136* 128* 127* 114*  ?BUN 59* 72* 43* 49*  ?CREATININE 4.72* 5.61* 4.05* 4.65*  ?CALCIUM 13.6* 14.0* 10.1 9.3  ?MG 2.6* 2.8*  --   --   ?PHOS 3.7 4.4 2.5 2.5  ? ? ? ?Liver Function Tests: ?Recent Labs  ?Lab 11/14/21 ?1749 11/15/21 ?4496 11/17/21 ?7591 11/20/21 ?0340  ?ALBUMIN 2.2* 2.3* 2.0* 2.2*  ? ? ?No results for input(s): LIPASE, AMYLASE in the last 168 hours. ?No results for input(s): AMMONIA in the last 168 hours. ? ?CBC: ?Recent Labs  ?Lab 11/15/21 ?6384 11/17/21 ?0352 11/17/21 ?1557 11/20/21 ?0340  ?WBC 11.8* 11.3* 10.2 7.9  ?NEUTROABS  --   --  7.0  --   ?HGB 9.5* 7.8* 8.1* 8.5*  ?HCT 30.4* 25.7* 27.0* 28.4*  ?MCV 90.7 91.8 91.8 91.3  ?PLT 287 244 266 326  ? ? ? ?Cardiac Enzymes: ?No results for input(s): CKTOTAL, CKMB, CKMBINDEX, TROPONINI in the last 168 hours. ? ?BNP: ?Invalid input(s): POCBNP ? ?CBG: ?No results for input(s): GLUCAP in the last 168 hours. ? ? ?Microbiology: ?Results for orders placed or performed during the hospital  encounter of 10/25/21  ?Culture, blood (routine x 2)     Status: None  ? Collection Time: 11/02/21  7:17 AM  ? Specimen: Left Antecubital; Blood  ?Result Value Ref Range Status  ? Specimen Description LEFT ANTECUBITAL  Final  ? Special Requests   Final  ?  BOTTLES DRAWN AEROBIC AND ANAEROBIC Blood Culture adequate volume  ? Culture   Final  ?  NO GROWTH 5 DAYS ?Performed at [facility], [place] [address], [place], [place] [address] ?  ? Report Status 11/07/2021 FINAL  Final  ?Culture, blood (routine x 2)     Status: None  ? Collection Time: 11/02/21  7:18 AM  ? Specimen: BLOOD LEFT ARM  ?Result Value Ref Range Status  ? Specimen Description BLOOD LEFT ARM  Final  ? Special Requests   Final  ?  BOTTLES DRAWN AEROBIC AND ANAEROBIC Blood Culture adequate volume  ? Culture   Final  ?  NO GROWTH 5 DAYS ?Performed at [facility], [place] [address] [place],  [address] ?  ? Report Status 11/07/2021 FINAL  Final  ? ? ?Coagulation Studies: ?No results for input(s): LABPROT, INR in the last 72 hours. ? ?Urinalysis: ?No results for input(s): COLORURINE, LABSPEC, [place], GLUCOSEU, HGBUR, BILIRUBINUR, KETONESUR, PROTEINUR, UROBILINOGEN, NITRITE, LEUKOCYTESUR in the last 72 hours. ? ?Invalid input(s): APPERANCEUR  ? ? ?Imaging: ?No  results found. ? ? ?Medications:  ? ? ? ? ? ?Assessment/ Plan:  ?40 y.o. 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 ? ?1.  ESRD on HD.  We plan to maintain the patient on MWF dialysis treatment schedule.  Therefore due for dialysis  treatment today. ? ?2.  Anemia of chronic kidney disease.  Hemoglobin up to 8.5.  Maintain the patient on Retacrit 10,000 units IV with dialysis treatments. ? ?3.  Secondary hyperparathyroidism/hypercalcemia.  Zoledronic acid administered 11/14/2021.  Calcium normal at 9.3. ? ?4.  Hypotension.  Blood pressure currently acceptable at 134/81. ? LOS: 0 ?[name] ?4/10/20239:03 AM ? ? ?

## 2021-11-22 ENCOUNTER — Emergency Department (HOSPITAL_COMMUNITY)
Admission: EM | Admit: 2021-11-22 | Discharge: 2021-11-22 | Disposition: A | Discharge status: Home / Self Care | Payer: Medicare Other | Attending: Emergency Medicine | Admitting: Emergency Medicine

## 2021-11-22 DIAGNOSIS — Z794 Long term (current) use of insulin: Secondary | ICD-10-CM | POA: Diagnosis not present

## 2021-11-22 DIAGNOSIS — E119 Type 2 diabetes mellitus without complications: Secondary | ICD-10-CM | POA: Diagnosis not present

## 2021-11-22 DIAGNOSIS — X58XXXA Exposure to other specified factors, initial encounter: Secondary | ICD-10-CM | POA: Insufficient documentation

## 2021-11-22 DIAGNOSIS — L89322 Pressure ulcer of left buttock, stage 2: Secondary | ICD-10-CM | POA: Insufficient documentation

## 2021-11-22 DIAGNOSIS — S3992XA Unspecified injury of lower back, initial encounter: Secondary | ICD-10-CM | POA: Insufficient documentation

## 2021-11-22 LAB — I-STAT CHEM 8, ED
BUN: 16 mg/dL (ref 6–20)
Calcium, Ion: 1.06 mmol/L — ABNORMAL LOW (ref 1.15–1.40)
Chloride: 97 mmol/L — ABNORMAL LOW (ref 98–111)
Creatinine, Ser: 2.4 mg/dL — ABNORMAL HIGH (ref 0.44–1.00)
Glucose, Bld: 87 mg/dL (ref 70–99)
HCT: 35 % — ABNORMAL LOW (ref 36.0–46.0)
Hemoglobin: 11.9 g/dL — ABNORMAL LOW (ref 12.0–15.0)
Potassium: 3.4 mmol/L — ABNORMAL LOW (ref 3.5–5.1)
Sodium: 137 mmol/L (ref 135–145)
TCO2: 32 mmol/L (ref 22–32)

## 2021-11-22 LAB — CBC WITH DIFFERENTIAL/PLATELET
Abs Immature Granulocytes: 0.03 10*3/uL (ref 0.00–0.07)
Basophils Absolute: 0.1 10*3/uL (ref 0.0–0.1)
Basophils Relative: 1 %
Eosinophils Absolute: 0.7 10*3/uL — ABNORMAL HIGH (ref 0.0–0.5)
Eosinophils Relative: 8 %
HCT: 32.9 % — ABNORMAL LOW (ref 36.0–46.0)
Hemoglobin: 9.5 g/dL — ABNORMAL LOW (ref 12.0–15.0)
Immature Granulocytes: 0 %
Lymphocytes Relative: 19 %
Lymphs Abs: 1.5 10*3/uL (ref 0.7–4.0)
MCH: 26.8 pg (ref 26.0–34.0)
MCHC: 28.9 g/dL — ABNORMAL LOW (ref 30.0–36.0)
MCV: 92.7 fL (ref 80.0–100.0)
Monocytes Absolute: 0.6 10*3/uL (ref 0.1–1.0)
Monocytes Relative: 7 %
Neutro Abs: 5.3 10*3/uL (ref 1.7–7.7)
Neutrophils Relative %: 65 %
Platelets: 377 10*3/uL (ref 150–400)
RBC: 3.55 MIL/uL — ABNORMAL LOW (ref 3.87–5.11)
RDW: 17.3 % — ABNORMAL HIGH (ref 11.5–15.5)
WBC: 8.1 10*3/uL (ref 4.0–10.5)
nRBC: 0 % (ref 0.0–0.2)

## 2021-11-22 LAB — CBG MONITORING, ED: Glucose-Capillary: 81 mg/dL (ref 70–99)

## 2021-11-22 NOTE — ED Provider Notes (Signed)
?[facility] ?Provider Note ? ? ?CSN: [id number] ?Arrival date & time: 11/22/21  1646 ? ?  ? ?History ? ?Chief Complaint  ?Patient presents with  ? Wound Infection  ? ? ?[name] is a 41 y.o. female. ? ?HPI ?Patient presents by EMS for evaluation of "infected wound left buttocks."  She is unable to give any history.  She takes dialysis.  She is blind in her right eye.  She has diabetes. ?  ? ?Home Medications ?Prior to Admission medications   ?Medication Sig Start Date End Date Taking? Authorizing Provider  ?Accu-Chek FastClix Lancets MISC Use as instructed to check blood sugar up to TID. E11.22 ?Patient taking differently: 1 each by Other route See admin instructions. Use as instructed to check blood sugar up to TID. E11.22 04/15/19   Charlott Rakes, MD  ?atorvastatin (LIPITOR) 40 MG tablet Place 1 tablet (40 mg total) into feeding tube daily. ?Patient taking differently: Place 40 mg into feeding tube every morning. 10/26/21   Shelly Coss, MD  ?Biotin 1000 MCG tablet Place 1,000 mcg into feeding tube every morning.    [provider]  ?Blood Glucose Monitoring Suppl (ACCU-CHEK GUIDE ME) w/Device KIT 1 kit by Does not apply route 3 (three) times daily. Use to check BG at home up to 3 times daily. E11.22 04/15/19   Charlott Rakes, MD  ?calcitRIOL (ROCALTROL) 0.25 MCG capsule Place 0.25 mcg into feeding tube 2 (two) times daily.    [provider]  ?cinacalcet (SENSIPAR) 90 MG tablet Place 90 mg into feeding tube every morning. 09/22/19   [provider]  ?Continuous Blood Gluc Receiver (DEXCOM G6 RECEIVER) DEVI 1 Device by Does not apply route as directed. 09/12/20   Shamleffer, Melanie Crazier, MD  ?Continuous Blood Gluc Sensor (DEXCOM G6 SENSOR) MISC 1 Device by Does not apply route as directed. 09/12/20   Shamleffer, Melanie Crazier, MD  ?Continuous Blood Gluc Transmit (DEXCOM G6 TRANSMITTER) MISC 1 Device by Does not apply route as directed.  09/12/20   Shamleffer, Melanie Crazier, MD  ?dorzolamide-timolol (COSOPT) 22.3-6.8 MG/ML ophthalmic solution Place 1 drop into the left eye 2 (two) times daily. 03/30/21   [provider]  ?epoetin alfa-epbx (RETACRIT) 96789 UNIT/ML injection 10,000 Units every Monday, Wednesday, and Friday with hemodialysis. Intramuscularly per order summary report    [provider]  ?glucose blood (ACCU-CHEK GUIDE) test strip Use as instructed to check blood sugar up to TID. E11.22 ?Patient taking differently: 1 each by Other route See admin instructions. Use as instructed to check blood sugar up to TID. E11.22 07/17/19   Fulp, Cammie, MD  ?heparin 5000 UNIT/ML injection Inject 1 mL (5,000 Units total) into the skin every 8 (eight) hours. ?Patient not taking: Reported on 11/23/2021 10/28/21   Shelly Coss, MD  ?HYDROmorphone (DILAUDID) 1 MG/ML injection Inject 0.5 mLs (0.5 mg total) into the vein every 4 (four) hours as needed for severe pain. ?Patient not taking: Reported on 11/23/2021 10/25/21   Shelly Coss, MD  ?insulin aspart (NOVOLOG) 100 UNIT/ML injection Inject 0-6 Units into the skin every 4 (four) hours. ?Patient not taking: Reported on 11/23/2021 10/25/21   Shelly Coss, MD  ?insulin glargine-yfgn (SEMGLEE) 100 UNIT/ML injection Inject 0.05 mLs (5 Units total) into the skin 2 (two) times daily. 10/25/21   Shelly Coss, MD  ?Insulin Pen Needle 32G X 4 MM MISC 1 Device by Does not apply route as directed. 05/13/20   Shamleffer, Melanie Crazier, MD  ?midodrine (  PROAMATINE) 5 MG tablet Take 1 tablet (5 mg total) by mouth 3 (three) times daily with meals. ?Patient taking differently: Place 5 mg into feeding tube 3 (three) times daily with meals. 10/25/21   Shelly Coss, MD  ?Multiple Vitamin (MULTIVITAMIN WITH MINERALS) TABS tablet Place 1 tablet into feeding tube every morning.    [provider]  ?nutrition supplement, JUVEN, (JUVEN) PACK Place 1 packet into feeding tube 2 (two) times daily  between meals. ?Patient not taking: Reported on 11/23/2021 10/25/21   Shelly Coss, MD  ?Nutritional Supplement LIQD Take 30 mLs by mouth daily. Pro Heal    [provider]  ?Nutritional Supplements (FEEDING SUPPLEMENT, NEPRO CARB STEADY,) LIQD Place 960 mLs into feeding tube daily. ?Patient taking differently: 30 mLs by Per J Tube route every 2 (two) hours. 10/25/21   Shelly Coss, MD  ?Nutritional Supplements (FEEDING SUPPLEMENT, PROSOURCE TF,) liquid Place 45 mLs into feeding tube 2 (two) times daily. ?Patient not taking: Reported on 11/23/2021 10/25/21   Shelly Coss, MD  ?nystatin (MYCOSTATIN) 100000 UNIT/ML suspension Take 5 mLs (500,000 Units total) by mouth 4 (four) times daily. ?Patient not taking: Reported on 11/23/2021 10/25/21   Shelly Coss, MD  ?oxyCODONE-acetaminophen (PERCOCET) 5-325 MG tablet Take 1 tablet by mouth every 6 (six) hours as needed for severe pain. 09/04/21   Ulyses Amor, PA-C  ?sennosides (SENOKOT) 8.8 MG/5ML syrup Place 5 mLs into feeding tube 2 (two) times daily. 10/25/21   Shelly Coss, MD  ?sevelamer carbonate (RENVELA) 0.8 g PACK packet Take 1.6 g by mouth 3 (three) times daily with meals. ?Patient not taking: Reported on 11/23/2021 10/25/21 11/24/21  Shelly Coss, MD  ?thiamine 100 MG tablet Place 1 tablet (100 mg total) into feeding tube daily. ?Patient not taking: Reported on 11/23/2021 10/26/21   Shelly Coss, MD  ?Water For Irrigation, Sterile (FREE WATER) SOLN Place 30 mLs into feeding tube every 4 (four) hours. ?Patient not taking: Reported on 11/23/2021 10/25/21   Shelly Coss, MD  ?   ? ?Allergies    ?Morphine, Peanut-containing drug products, Penicillins, and Chocolate   ? ?Review of Systems   ?Review of Systems ? ?Physical Exam ?Updated Vital Signs ?BP (!) 177/79   Pulse 98   Temp 97.7 ?F (36.5 ?C) (Oral)   Resp 19   SpO2 100%  ?Physical Exam ?Vitals and nursing note reviewed.  ?Constitutional:   ?   General: She is not in acute distress. ?    Appearance: She is well-developed. She is not ill-appearing or diaphoretic.  ?HENT:  ?   Head: Normocephalic and atraumatic.  ?   Right Ear: External ear normal.  ?   Left Ear: External ear normal.  ?Neck:  ?   Trachea: Phonation normal.  ?Cardiovascular:  ?   Rate and Rhythm: Normal rate.  ?Pulmonary:  ?   Effort: Pulmonary effort is normal.  ?Abdominal:  ?   General: There is no distension.  ?Musculoskeletal:  ?   Cervical back: Normal range of motion and neck supple.  ?   Comments: Right leg AKA.  No deformity left leg.  ?Skin: ?   General: Skin is warm and dry.  ?   Comments: Left medial buttock, near the SI region has a superficial stage II ulcer, approximately 6 x 4 cm.  There is granulation tissue at the base of the wound.  There is no surrounding erythema, drainage, bleeding or fluctuance  ?Neurological:  ?   Mental Status: She is alert.  ?  Cranial Nerves: No cranial nerve deficit.  ?   Motor: No abnormal muscle tone.  ?   Coordination: Coordination normal.  ?Psychiatric:     ?   Behavior: Behavior normal.  ? ? ?ED Results / Procedures / Treatments   ?Labs ?(all labs ordered are listed, but only abnormal results are displayed) ?Labs Reviewed  ?CBC WITH DIFFERENTIAL/PLATELET - Abnormal; Notable for the following components:  ?    Result Value  ? RBC 3.55 (*)   ? Hemoglobin 9.5 (*)   ? HCT 32.9 (*)   ? MCHC 28.9 (*)   ? RDW 17.3 (*)   ? Eosinophils Absolute 0.7 (*)   ? All other components within normal limits  ?I-STAT CHEM 8, ED - Abnormal; Notable for the following components:  ? Potassium 3.4 (*)   ? Chloride 97 (*)   ? Creatinine, Ser 2.40 (*)   ? Calcium, Ion 1.06 (*)   ? Hemoglobin 11.9 (*)   ? HCT 35.0 (*)   ? All other components within normal limits  ?CBG MONITORING, ED  ? ? ?EKG ?None ? ?Radiology ?CT Head Wo Contrast ? ?Result Date: 11/23/2021 ?CLINICAL DATA:  Mental status change, unknown cause. EXAM: CT HEAD WITHOUT CONTRAST TECHNIQUE: Contiguous axial images were obtained from the base of the  skull through the vertex without intravenous contrast. RADIATION DOSE REDUCTION: This exam was performed according to the departmental dose-optimization program which includes automated exposure control, adjustment

## 2021-11-22 NOTE — ED Triage Notes (Signed)
Pt via [facility] from [place] for eval of infected wound to R buttock. No reported fevers, SHOB, associated symptoms ?Hx CKD on dialysis, R AKA, R arm restricted, PEG tube ? ?190/86 ?HR 82 ?

## 2021-11-22 NOTE — ED Provider Triage Note (Signed)
Emergency Medicine Provider Triage Evaluation Note ? ?[name] , a 41 y.o. female  was evaluated in triage.  Pt complains of wound infection concerns. ? ?Review of Systems  ?Positive: Pain in buttock, pain in R stump ?Negative: fever ? ?Physical Exam  ?There were no vitals taken for this visit. ?Gen:   Awake, moaning ?Resp:  Normal effort  ?MSK:   Moves extremities without difficulty  ?Other:   ? ?Medical Decision Making  ?Medically screening exam initiated at 4:53 PM.  Appropriate orders placed.  [name] was informed that the remainder of the evaluation will be completed by another provider, this initial triage assessment does not replace that evaluation, and the importance of remaining in the ED until their evaluation is complete. ? ?Pt is hard of hearing, difficult to obtain hx.  Complain of pressure wound in buttock and R stump ?  ?Domenic Moras, PA-C ?11/22/21 1701 ? ?

## 2021-11-22 NOTE — Discharge Instructions (Signed)
The wound on the left buttocks does not appear infected.  Clean it with soap and water daily rinse and dry well.  After that apply a nonadherent dressing, and a bulky bandage, to help protect it.  Try to offload the area by avoiding lying on it.  Continue your regular dialysis treatments, and usual medications.  Have the nurse at your facility check the wound for problems and progression.  This should be done within the next 48 hours. ?

## 2021-11-23 ENCOUNTER — Emergency Department (HOSPITAL_COMMUNITY): Payer: Medicare Other

## 2021-11-23 ENCOUNTER — Other Ambulatory Visit: Payer: Self-pay

## 2021-11-23 ENCOUNTER — Encounter (HOSPITAL_COMMUNITY): Payer: Self-pay | Admitting: Emergency Medicine

## 2021-11-23 ENCOUNTER — Emergency Department (HOSPITAL_COMMUNITY)
Admission: EM | Admit: 2021-11-23 | Discharge: 2021-11-23 | Disposition: A | Discharge status: Skilled Nursing Facility | Payer: Medicare Other | Attending: Emergency Medicine | Admitting: Emergency Medicine

## 2021-11-23 DIAGNOSIS — Z79899 Other long term (current) drug therapy: Secondary | ICD-10-CM | POA: Diagnosis not present

## 2021-11-23 DIAGNOSIS — Z91010 Allergy to peanuts: Secondary | ICD-10-CM | POA: Diagnosis not present

## 2021-11-23 DIAGNOSIS — Z992 Dependence on renal dialysis: Secondary | ICD-10-CM | POA: Insufficient documentation

## 2021-11-23 DIAGNOSIS — T8781 Dehiscence of amputation stump: Secondary | ICD-10-CM | POA: Insufficient documentation

## 2021-11-23 DIAGNOSIS — Z5189 Encounter for other specified aftercare: Secondary | ICD-10-CM

## 2021-11-23 DIAGNOSIS — N186 End stage renal disease: Secondary | ICD-10-CM | POA: Diagnosis not present

## 2021-11-23 DIAGNOSIS — R4182 Altered mental status, unspecified: Secondary | ICD-10-CM | POA: Diagnosis not present

## 2021-11-23 DIAGNOSIS — Z794 Long term (current) use of insulin: Secondary | ICD-10-CM | POA: Insufficient documentation

## 2021-11-23 LAB — COMPREHENSIVE METABOLIC PANEL
ALT: 16 U/L (ref 0–44)
AST: 22 U/L (ref 15–41)
Albumin: 2.6 g/dL — ABNORMAL LOW (ref 3.5–5.0)
Alkaline Phosphatase: 75 U/L (ref 38–126)
Anion gap: 12 (ref 5–15)
BUN: 20 mg/dL (ref 6–20)
CO2: 29 mmol/L (ref 22–32)
Calcium: 9.2 mg/dL (ref 8.9–10.3)
Chloride: 98 mmol/L (ref 98–111)
Creatinine, Ser: 3.15 mg/dL — ABNORMAL HIGH (ref 0.44–1.00)
GFR, Estimated: 18 mL/min — ABNORMAL LOW (ref >60)
Glucose, Bld: 80 mg/dL (ref 70–99)
Potassium: 3.5 mmol/L (ref 3.5–5.1)
Sodium: 139 mmol/L (ref 135–145)
Total Bilirubin: 0.5 mg/dL (ref 0.3–1.2)
Total Protein: 7.7 g/dL (ref 6.5–8.1)

## 2021-11-23 LAB — CBC WITH DIFFERENTIAL/PLATELET
Abs Immature Granulocytes: 0.03 10*3/uL (ref 0.00–0.07)
Basophils Absolute: 0.1 10*3/uL (ref 0.0–0.1)
Basophils Relative: 1 %
Eosinophils Absolute: 0.6 10*3/uL — ABNORMAL HIGH (ref 0.0–0.5)
Eosinophils Relative: 8 %
HCT: 31.3 % — ABNORMAL LOW (ref 36.0–46.0)
Hemoglobin: 9.3 g/dL — ABNORMAL LOW (ref 12.0–15.0)
Immature Granulocytes: 0 %
Lymphocytes Relative: 19 %
Lymphs Abs: 1.5 10*3/uL (ref 0.7–4.0)
MCH: 27.4 pg (ref 26.0–34.0)
MCHC: 29.7 g/dL — ABNORMAL LOW (ref 30.0–36.0)
MCV: 92.3 fL (ref 80.0–100.0)
Monocytes Absolute: 0.6 10*3/uL (ref 0.1–1.0)
Monocytes Relative: 7 %
Neutro Abs: 5.1 10*3/uL (ref 1.7–7.7)
Neutrophils Relative %: 65 %
Platelets: 380 10*3/uL (ref 150–400)
RBC: 3.39 MIL/uL — ABNORMAL LOW (ref 3.87–5.11)
RDW: 17.5 % — ABNORMAL HIGH (ref 11.5–15.5)
WBC: 7.8 10*3/uL (ref 4.0–10.5)
nRBC: 0 % (ref 0.0–0.2)

## 2021-11-23 LAB — CBG MONITORING, ED: Glucose-Capillary: 82 mg/dL (ref 70–99)

## 2021-11-23 LAB — I-STAT CHEM 8, ED
BUN: 23 mg/dL — ABNORMAL HIGH (ref 6–20)
Calcium, Ion: 1.08 mmol/L — ABNORMAL LOW (ref 1.15–1.40)
Chloride: 98 mmol/L (ref 98–111)
Creatinine, Ser: 3.2 mg/dL — ABNORMAL HIGH (ref 0.44–1.00)
Glucose, Bld: 80 mg/dL (ref 70–99)
HCT: 33 % — ABNORMAL LOW (ref 36.0–46.0)
Hemoglobin: 11.2 g/dL — ABNORMAL LOW (ref 12.0–15.0)
Potassium: 3.7 mmol/L (ref 3.5–5.1)
Sodium: 136 mmol/L (ref 135–145)
TCO2: 32 mmol/L (ref 22–32)

## 2021-11-23 LAB — HCG, SERUM, QUALITATIVE: Preg, Serum: NEGATIVE

## 2021-11-23 LAB — SEDIMENTATION RATE: Sed Rate: 80 mm/hr — ABNORMAL HIGH (ref 0–22)

## 2021-11-23 LAB — C-REACTIVE PROTEIN: CRP: 6 mg/dL — ABNORMAL HIGH (ref <1.0)

## 2021-11-23 LAB — AMMONIA: Ammonia: 41 umol/L — ABNORMAL HIGH (ref 9–35)

## 2021-11-23 LAB — I-STAT BETA HCG BLOOD, ED (MC, WL, AP ONLY): I-stat hCG, quantitative: 10 m[IU]/mL — ABNORMAL HIGH (ref <5)

## 2021-11-23 LAB — LACTIC ACID, PLASMA: Lactic Acid, Venous: 1.1 mmol/L (ref 0.5–1.9)

## 2021-11-23 MED ORDER — INSULIN GLARGINE-YFGN 100 UNIT/ML ~~LOC~~ SOLN: 5.0000 [IU] | Freq: Two times a day (BID) | SUBCUTANEOUS | Status: DC

## 2021-11-23 MED ORDER — SENNOSIDES 8.8 MG/5ML PO SYRP
5.0000 mL | ORAL_SOLUTION | Freq: Two times a day (BID) | ORAL | Status: DC
Start: 2021-11-23 — End: 2021-11-24

## 2021-11-23 MED ORDER — DORZOLAMIDE HCL-TIMOLOL MAL 2-0.5 % OP SOLN: 1.0000 [drp] | Freq: Two times a day (BID) | OPHTHALMIC | Status: DC

## 2021-11-23 MED ORDER — OXYCODONE-ACETAMINOPHEN 5-325 MG PO TABS: 1.0000 | ORAL_TABLET | Freq: Four times a day (QID) | ORAL | Status: DC | PRN

## 2021-11-23 MED ORDER — ADULT MULTIVITAMIN W/MINERALS CH: 1.0000 | ORAL_TABLET | Freq: Every morning | ORAL | Status: DC

## 2021-11-23 MED ORDER — CALCITRIOL 0.25 MCG PO CAPS: 0.2500 ug | ORAL_CAPSULE | Freq: Two times a day (BID) | ORAL | Status: DC

## 2021-11-23 NOTE — ED Provider Notes (Signed)
?[place] ?Provider Note ? ? ?CSN: [id number] ?Arrival date & time: 11/23/21  0755 ? ?  ? ?History ? ?Chief Complaint  ?Patient presents with  ? Wound Check  ? ? ?[name] is a 41 y.o. female with an extensive and complicated PMH including hx of ESRD (unsure of dialysis schedule or last dialysis), history of embolic stroke/PFO, metabolic encephalopathy, osteomyelitis s/p R AKA, PVD, Blindness of R eye/ poor vision, HOH, who presents for evaluation of R stump wound. She was seen for wound eval last night in the ED and had her decubitus ulcer evaluated. She was sent back to the ED to evaluate her stump. The patient [place] unable to provide hx. I am unsure of the patient's baseline mental status, however at patient's discharge on 10/25/2021 she was alert and oriented.  I obtained history from the patient's sister, Fidel Levy who was also a poor historian.  Review of EMR shows that the patient had an extended hospital stay in February through mid March.  She was originally admitted for osteomyelitis.  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  ? ?Wound Check ? ? ?  ? ?Home Medications ?Prior to Admission medications   ?Medication Sig Start Date End Date Taking? Authorizing Provider  ?Accu-Chek FastClix Lancets MISC Use as instructed to check blood sugar up to TID. E11.22 ?Patient taking differently: 1 each by Other route See admin instructions. Use as instructed to check blood sugar up to TID. E11.22 04/15/19   Charlott Rakes, MD  ?atorvastatin (LIPITOR) 40 MG tablet Place 1 tablet (40 mg total) into feeding tube daily. 10/26/21   Shelly Coss, MD  ?Biotin w/ Vitamins C & E (HAIR/SKIN/NAILS PO) Take 1 capsule by mouth daily.    [provider]  ?Blood Glucose Monitoring Suppl (ACCU-CHEK GUIDE ME) w/Device KIT 1 kit by Does not apply route 3 (three) times daily. Use to check BG at home up to  3 times daily. E11.22 04/15/19   Charlott Rakes, MD  ?calcitRIOL (ROCALTROL) 0.25 MCG capsule Take 0.25 mcg by mouth 2 (two) times daily.     [provider]  ?cinacalcet (SENSIPAR) 90 MG tablet Take 90 mg by mouth daily.  09/22/19   [provider]  ?Continuous Blood Gluc Receiver (DEXCOM G6 RECEIVER) DEVI 1 Device by Does not apply route as directed. 09/12/20   Shamleffer, Melanie Crazier, MD  ?Continuous Blood Gluc Sensor (DEXCOM G6 SENSOR) MISC 1 Device by Does not apply route as directed. 09/12/20   Shamleffer, Melanie Crazier, MD  ?Continuous Blood Gluc Transmit (DEXCOM G6 TRANSMITTER) MISC 1 Device by Does not apply route as directed. 09/12/20   Shamleffer, Melanie Crazier, MD  ?diphenhydramine-acetaminophen (TYLENOL PM) 25-500 MG TABS tablet Take 1 tablet by mouth at bedtime as needed (sleep).    [provider]  ?dorzolamide-timolol (COSOPT) 22.3-6.8 MG/ML ophthalmic solution Place 1 drop into the left eye 2 (two) times daily. 03/30/21   [provider]  ?glucose blood (ACCU-CHEK GUIDE) test strip Use as instructed to check blood sugar up to TID. E11.22 ?Patient taking differently: 1 each by Other route See admin instructions. Use as instructed to check blood sugar up to TID. E11.22 07/17/19   Fulp, Cammie, MD  ?heparin 5000 UNIT/ML injection Inject 1 mL (5,000 Units total) into the skin every 8 (eight) hours. 10/28/21   Shelly Coss, MD  ?HYDROmorphone (DILAUDID) 1 MG/ML injection Inject 0.5 mLs (  0.5 mg total) into the vein every 4 (four) hours as needed for severe pain. 10/25/21   Shelly Coss, MD  ?insulin aspart (NOVOLOG) 100 UNIT/ML injection Inject 0-6 Units into the skin every 4 (four) hours. 10/25/21   Shelly Coss, MD  ?insulin glargine-yfgn (SEMGLEE) 100 UNIT/ML injection Inject 0.05 mLs (5 Units total) into the skin 2 (two) times daily. 10/25/21   Shelly Coss, MD  ?Insulin Pen Needle 32G X 4 MM MISC 1 Device by Does not apply route as directed. 05/13/20    Shamleffer, Melanie Crazier, MD  ?lidocaine-prilocaine (EMLA) cream Apply 1 application topically as directed.  09/07/19   [provider]  ?midodrine (PROAMATINE) 5 MG tablet Take 1 tablet (5 mg total) by mouth 3 (three) times daily with meals. 10/25/21   Shelly Coss, MD  ?multivitamin (RENA-VIT) TABS tablet Take 1 tablet by mouth daily. 07/18/19   Fulp, Cammie, MD  ?nutrition supplement, JUVEN, (JUVEN) PACK Place 1 packet into feeding tube 2 (two) times daily between meals. 10/25/21   Shelly Coss, MD  ?Nutritional Supplements (FEEDING SUPPLEMENT, NEPRO CARB STEADY,) LIQD Place 960 mLs into feeding tube daily. 10/25/21   Shelly Coss, MD  ?Nutritional Supplements (FEEDING SUPPLEMENT, PROSOURCE TF,) liquid Place 45 mLs into feeding tube 2 (two) times daily. 10/25/21   Shelly Coss, MD  ?nystatin (MYCOSTATIN) 100000 UNIT/ML suspension Take 5 mLs (500,000 Units total) by mouth 4 (four) times daily. 10/25/21   Shelly Coss, MD  ?oxyCODONE-acetaminophen (PERCOCET) 5-325 MG tablet Take 1 tablet by mouth every 6 (six) hours as needed for severe pain. 09/04/21   Ulyses Amor, PA-C  ?sennosides (SENOKOT) 8.8 MG/5ML syrup Place 5 mLs into feeding tube 2 (two) times daily. 10/25/21   Shelly Coss, MD  ?sevelamer carbonate (RENVELA) 0.8 g PACK packet Take 1.6 g by mouth 3 (three) times daily with meals. 10/25/21 11/24/21  Shelly Coss, MD  ?thiamine 100 MG tablet Place 1 tablet (100 mg total) into feeding tube daily. 10/26/21   Shelly Coss, MD  ?Water For Irrigation, Sterile (FREE WATER) SOLN Place 30 mLs into feeding tube every 4 (four) hours. 10/25/21   Shelly Coss, MD  ?   ? ?Allergies    ?Morphine, Peanut-containing drug products, Penicillins, and Chocolate   ? ?Review of Systems   ?Review of Systems  ?Skin:  Positive for wound.  ? ?Physical Exam ?Updated Vital Signs ?BP (!) 186/82 (BP Location: Left Arm)   Pulse 88   Temp 98.5 ?F (36.9 ?C) (Oral)   Resp 16   SpO2 98%  ?Physical  Exam ?Nursing note reviewed.  ?HENT:  ?   Head: Normocephalic.  ?   Mouth/Throat:  ?   Mouth: Mucous membranes are dry.  ?Eyes:  ?   Comments: Ptosis R eye, discharge of left eye  ?Cardiovascular:  ?   Rate and Rhythm: Normal rate and regular rhythm.  ?   Pulses:     ?     Dorsalis pedis pulses are 1+ on the left side.  ?   Comments: RLE with signs of PVD, thready 1+ DP pulse, Skin desquamation and atrophy of the tissues. Shiny , hairless skin ?Pulmonary:  ?   Effort: Pulmonary effort is normal.  ?   Breath sounds: Normal breath sounds.  ?Abdominal:  ?   General: There is no distension.  ?   Palpations: Abdomen is soft.  ?   Tenderness: There is no abdominal tenderness.  ?Musculoskeletal:  ?   Cervical back: Normal range of  motion.  ?Skin: ?   Comments: S/p R BKA - area of dehiscence along the medial aspect of the Stump. There is obvious macerated and necrotic tissue present. No obvious discharge.  ?Neurological:  ?   Mental Status: She is alert.  ? ? ? ?ED Results / Procedures / Treatments   ?Labs ?(all labs ordered are listed, but only abnormal results are displayed) ?Labs Reviewed - No data to display ? ?EKG ?None ? ?Radiology ?No results found. ? ?Procedures ?Procedures  ? ? ?Medications Ordered in ED ?Medications - No data to display ? ?ED Course/ Medical Decision Making/ A&P ?Clinical Course as of 11/23/21 1443  ?Thu Nov 23, 2021  ?0839 I attempted to speak to a nurse/ caretaker at [facility]. I was on hold for 10 minutes and then someone hung up on the call. I attempted to call back and no one picked up the phone. [AH]  ?1018 CT Head Wo Contrast ?I visualized images of CT HEad - No acute findings [AH]  ?1022 DG Femur Min 2 Views Right ?I personally visualized images of R femur, no signs of osteomyelitis. [AH]  ?1125 Patient's labs are stuck in the pneumatic tube system. I have ordered a stat chem 8 and EKG [AH]  ?1208 Hemoglobin(!): 9.3 ?Hgb at baseline, no leukocytosis [AH]  ?1209 Comprehensive  metabolic panel(!) ?No sig abnormlaties [AH]  ?1209 Ammonia(!): 41 ?Ammonia only slightly elevated [AH]  ?1209 I-stat hCG, quantitative(!): 10.0 ?Likely false positive awaiting verification with qualtitative hcg [AH]  ?[place]

## 2021-11-23 NOTE — ED Triage Notes (Signed)
Pt via [facility] from [place] for eval of infected wound to R knee. Pt has an AKA to same side. Facility stating knee is green and malodorous. VSS. Was here last night for same however, the wrong wound was evaluated per facility. Hx of CKD on dialysis.  ?

## 2021-11-23 NOTE — ED Notes (Signed)
Trauma Event Note ? ?U/S guided 22G IV placed in LAC, all blood obtained for laboratory tests but IV catheter would not advance. IV removed and RN notified. ? ?Park Pope Bindu Docter  ?Trauma Response RN ? ?Please call TRN at [phone] for further assistance. ? ? ?  ?

## 2021-11-23 NOTE — Discharge Instructions (Signed)
The patient's wound is not infected. ?She does have tissue that is necrotic and needs to be debrided by a wound management specialist. ?Frequently this is done at skilled nursing facilities by a wound management specialist. ?

## 2021-11-23 NOTE — ED Notes (Signed)
X1 attempt to call [facility], no answer.  ?

## 2021-11-23 NOTE — ED Notes (Signed)
[facility] called; fifth in line ?

## 2021-11-28 LAB — CULTURE, BLOOD (ROUTINE X 2)
Culture: NO GROWTH
Culture: NO GROWTH

## 2022-01-03 ENCOUNTER — Encounter: Payer: Self-pay | Admitting: Diagnostic Neuroimaging

## 2022-01-03 ENCOUNTER — Inpatient Hospital Stay: Payer: Medicare Other | Admitting: Diagnostic Neuroimaging

## 2022-02-09 ENCOUNTER — Other Ambulatory Visit: Payer: Self-pay

## 2022-02-09 ENCOUNTER — Encounter (HOSPITAL_COMMUNITY): Payer: Self-pay

## 2022-02-09 ENCOUNTER — Observation Stay (HOSPITAL_COMMUNITY)
Admission: EM | Admit: 2022-02-09 | Discharge: 2022-02-11 | Disposition: A | Discharge status: Skilled Nursing Facility | Payer: Medicaid Other | Attending: Internal Medicine | Admitting: Internal Medicine

## 2022-02-09 ENCOUNTER — Emergency Department (HOSPITAL_COMMUNITY): Payer: Medicaid Other

## 2022-02-09 DIAGNOSIS — E875 Hyperkalemia: Principal | ICD-10-CM

## 2022-02-09 DIAGNOSIS — Z87898 Personal history of other specified conditions: Secondary | ICD-10-CM

## 2022-02-09 DIAGNOSIS — R10819 Abdominal tenderness, unspecified site: Secondary | ICD-10-CM | POA: Insufficient documentation

## 2022-02-09 DIAGNOSIS — Z7902 Long term (current) use of antithrombotics/antiplatelets: Secondary | ICD-10-CM | POA: Insufficient documentation

## 2022-02-09 DIAGNOSIS — I120 Hypertensive chronic kidney disease with stage 5 chronic kidney disease or end stage renal disease: Secondary | ICD-10-CM | POA: Insufficient documentation

## 2022-02-09 DIAGNOSIS — Z8673 Personal history of transient ischemic attack (TIA), and cerebral infarction without residual deficits: Secondary | ICD-10-CM | POA: Diagnosis not present

## 2022-02-09 DIAGNOSIS — D638 Anemia in other chronic diseases classified elsewhere: Secondary | ICD-10-CM | POA: Diagnosis not present

## 2022-02-09 DIAGNOSIS — E1122 Type 2 diabetes mellitus with diabetic chronic kidney disease: Secondary | ICD-10-CM | POA: Insufficient documentation

## 2022-02-09 DIAGNOSIS — R4182 Altered mental status, unspecified: Secondary | ICD-10-CM | POA: Diagnosis present

## 2022-02-09 DIAGNOSIS — R531 Weakness: Secondary | ICD-10-CM

## 2022-02-09 DIAGNOSIS — Z992 Dependence on renal dialysis: Secondary | ICD-10-CM | POA: Diagnosis not present

## 2022-02-09 DIAGNOSIS — N185 Chronic kidney disease, stage 5: Secondary | ICD-10-CM

## 2022-02-09 DIAGNOSIS — E119 Type 2 diabetes mellitus without complications: Secondary | ICD-10-CM

## 2022-02-09 DIAGNOSIS — N186 End stage renal disease: Secondary | ICD-10-CM | POA: Diagnosis not present

## 2022-02-09 DIAGNOSIS — R110 Nausea: Secondary | ICD-10-CM | POA: Diagnosis not present

## 2022-02-09 DIAGNOSIS — Z794 Long term (current) use of insulin: Secondary | ICD-10-CM | POA: Insufficient documentation

## 2022-02-09 DIAGNOSIS — R030 Elevated blood-pressure reading, without diagnosis of hypertension: Secondary | ICD-10-CM | POA: Diagnosis not present

## 2022-02-09 DIAGNOSIS — R911 Solitary pulmonary nodule: Secondary | ICD-10-CM | POA: Diagnosis not present

## 2022-02-09 DIAGNOSIS — E785 Hyperlipidemia, unspecified: Secondary | ICD-10-CM | POA: Diagnosis present

## 2022-02-09 DIAGNOSIS — Z79899 Other long term (current) drug therapy: Secondary | ICD-10-CM | POA: Insufficient documentation

## 2022-02-09 DIAGNOSIS — Z91010 Allergy to peanuts: Secondary | ICD-10-CM | POA: Insufficient documentation

## 2022-02-09 HISTORY — DX: Guillain-Barre syndrome: G61.0

## 2022-02-09 HISTORY — DX: Cerebral infarction, unspecified: I63.9

## 2022-02-09 LAB — CBC WITH DIFFERENTIAL/PLATELET
Abs Immature Granulocytes: 0.03 10*3/uL (ref 0.00–0.07)
Basophils Absolute: 0.1 10*3/uL (ref 0.0–0.1)
Basophils Relative: 1 %
Eosinophils Absolute: 0.3 10*3/uL (ref 0.0–0.5)
Eosinophils Relative: 3 %
HCT: 37.6 % (ref 36.0–46.0)
Hemoglobin: 11.1 g/dL — ABNORMAL LOW (ref 12.0–15.0)
Immature Granulocytes: 0 %
Lymphocytes Relative: 16 %
Lymphs Abs: 1.7 10*3/uL (ref 0.7–4.0)
MCH: 23.5 pg — ABNORMAL LOW (ref 26.0–34.0)
MCHC: 29.5 g/dL — ABNORMAL LOW (ref 30.0–36.0)
MCV: 79.5 fL — ABNORMAL LOW (ref 80.0–100.0)
Monocytes Absolute: 0.3 10*3/uL (ref 0.1–1.0)
Monocytes Relative: 3 %
Neutro Abs: 8.3 10*3/uL — ABNORMAL HIGH (ref 1.7–7.7)
Neutrophils Relative %: 77 %
Platelets: 129 10*3/uL — ABNORMAL LOW (ref 150–400)
RBC: 4.73 MIL/uL (ref 3.87–5.11)
RDW: 19.9 % — ABNORMAL HIGH (ref 11.5–15.5)
WBC: 10.6 10*3/uL — ABNORMAL HIGH (ref 4.0–10.5)
nRBC: 0 % (ref 0.0–0.2)

## 2022-02-09 LAB — COMPREHENSIVE METABOLIC PANEL
ALT: 15 U/L (ref 0–44)
AST: 25 U/L (ref 15–41)
Albumin: 2.8 g/dL — ABNORMAL LOW (ref 3.5–5.0)
Alkaline Phosphatase: 81 U/L (ref 38–126)
Anion gap: 19 — ABNORMAL HIGH (ref 5–15)
BUN: 58 mg/dL — ABNORMAL HIGH (ref 6–20)
CO2: 26 mmol/L (ref 22–32)
Calcium: 9.4 mg/dL (ref 8.9–10.3)
Chloride: 93 mmol/L — ABNORMAL LOW (ref 98–111)
Creatinine, Ser: 8.3 mg/dL — ABNORMAL HIGH (ref 0.44–1.00)
GFR, Estimated: 6 mL/min — ABNORMAL LOW (ref >60)
Glucose, Bld: 108 mg/dL — ABNORMAL HIGH (ref 70–99)
Potassium: 7.4 mmol/L (ref 3.5–5.1)
Sodium: 138 mmol/L (ref 135–145)
Total Bilirubin: 0.9 mg/dL (ref 0.3–1.2)
Total Protein: 7.8 g/dL (ref 6.5–8.1)

## 2022-02-09 LAB — HEMOGLOBIN A1C
Hgb A1c MFr Bld: 5.7 % — ABNORMAL HIGH (ref 4.8–5.6)
Mean Plasma Glucose: 116.89 mg/dL

## 2022-02-09 LAB — CBG MONITORING, ED: Glucose-Capillary: 124 mg/dL — ABNORMAL HIGH (ref 70–99)

## 2022-02-09 LAB — I-STAT VENOUS BLOOD GAS, ED
Acid-Base Excess: 4 mmol/L — ABNORMAL HIGH (ref 0.0–2.0)
Bicarbonate: 30.2 mmol/L — ABNORMAL HIGH (ref 20.0–28.0)
Calcium, Ion: 1.06 mmol/L — ABNORMAL LOW (ref 1.15–1.40)
HCT: 39 % (ref 36.0–46.0)
Hemoglobin: 13.3 g/dL (ref 12.0–15.0)
O2 Saturation: 73 %
Potassium: 6.9 mmol/L (ref 3.5–5.1)
Sodium: 135 mmol/L (ref 135–145)
TCO2: 32 mmol/L (ref 22–32)
pCO2, Ven: 49.8 mmHg (ref 44–60)
pH, Ven: 7.391 (ref 7.25–7.43)
pO2, Ven: 40 mmHg (ref 32–45)

## 2022-02-09 LAB — HEPATITIS B SURFACE ANTIBODY,QUALITATIVE: Hep B S Ab: REACTIVE — AB

## 2022-02-09 LAB — I-STAT BETA HCG BLOOD, ED (MC, WL, AP ONLY): I-stat hCG, quantitative: <5 m[IU]/mL (ref <5)

## 2022-02-09 LAB — HEPATITIS B SURFACE ANTIGEN: Hepatitis B Surface Ag: NONREACTIVE

## 2022-02-09 LAB — LIPASE, BLOOD: Lipase: 32 U/L (ref 11–51)

## 2022-02-09 MED ORDER — ATORVASTATIN CALCIUM 40 MG PO TABS
40.0000 mg | ORAL_TABLET | Freq: Every morning | ORAL | Status: DC
  Administered 2022-02-10 – 2022-02-11 (×2): 40 mg
  Filled 2022-02-09 (×2): qty 1

## 2022-02-09 MED ORDER — INSULIN ASPART 100 UNIT/ML IV SOLN
5.0000 [IU] | Freq: Once | INTRAVENOUS | Status: AC
  Administered 2022-02-09: 5 [IU] via INTRAVENOUS

## 2022-02-09 MED ORDER — SODIUM ZIRCONIUM CYCLOSILICATE 10 G PO PACK
10.0000 g | PACK | Freq: Once | ORAL | Status: AC
  Administered 2022-02-09: 10 g via ORAL
  Filled 2022-02-09: qty 1

## 2022-02-09 MED ORDER — FENTANYL CITRATE PF 50 MCG/ML IJ SOSY
50.0000 ug | PREFILLED_SYRINGE | Freq: Once | INTRAMUSCULAR | Status: AC
  Administered 2022-02-09: 50 ug via INTRAVENOUS
  Filled 2022-02-09: qty 1

## 2022-02-09 MED ORDER — ONDANSETRON HCL 4 MG/2ML IJ SOLN: 4.0000 mg | Freq: Four times a day (QID) | INTRAMUSCULAR | Status: DC | PRN

## 2022-02-09 MED ORDER — HEPARIN SODIUM (PORCINE) 1000 UNIT/ML DIALYSIS: 1000.0000 [IU] | INTRAMUSCULAR | Status: DC | PRN

## 2022-02-09 MED ORDER — CALCITRIOL 0.25 MCG PO CAPS
0.2500 ug | ORAL_CAPSULE | Freq: Two times a day (BID) | ORAL | Status: DC
  Administered 2022-02-10 – 2022-02-11 (×3): 0.25 ug
  Filled 2022-02-09 (×4): qty 1

## 2022-02-09 MED ORDER — ONDANSETRON HCL 4 MG/2ML IJ SOLN
4.0000 mg | Freq: Once | INTRAMUSCULAR | Status: AC
  Administered 2022-02-09: 4 mg via INTRAVENOUS
  Filled 2022-02-09: qty 2

## 2022-02-09 MED ORDER — ACETAMINOPHEN 650 MG RE SUPP: 650.0000 mg | Freq: Four times a day (QID) | RECTAL | Status: DC | PRN

## 2022-02-09 MED ORDER — CHLORHEXIDINE GLUCONATE CLOTH 2 % EX PADS
6.0000 | MEDICATED_PAD | Freq: Every day | CUTANEOUS | Status: DC
  Administered 2022-02-10: 6 via TOPICAL

## 2022-02-09 MED ORDER — CALCIUM GLUCONATE-NACL 1-0.675 GM/50ML-% IV SOLN
1.0000 g | Freq: Once | INTRAVENOUS | Status: AC
Start: 2022-02-09 — End: 2022-02-09
  Administered 2022-02-09: 1000 mg via INTRAVENOUS
  Filled 2022-02-09: qty 50

## 2022-02-09 MED ORDER — LIDOCAINE-PRILOCAINE 2.5-2.5 % EX CREA: 1.0000 | TOPICAL_CREAM | CUTANEOUS | Status: DC | PRN

## 2022-02-09 MED ORDER — DORZOLAMIDE HCL-TIMOLOL MAL 2-0.5 % OP SOLN
1.0000 [drp] | Freq: Two times a day (BID) | OPHTHALMIC | Status: DC
Start: 2022-02-09 — End: 2022-02-11
  Administered 2022-02-10 – 2022-02-11 (×4): 1 [drp] via OPHTHALMIC
  Filled 2022-02-09: qty 10

## 2022-02-09 MED ORDER — LORAZEPAM 2 MG/ML IJ SOLN
0.5000 mg | Freq: Four times a day (QID) | INTRAMUSCULAR | Status: DC | PRN
  Administered 2022-02-10: 0.5 mg via INTRAVENOUS
  Filled 2022-02-09: qty 1

## 2022-02-09 MED ORDER — ALTEPLASE 2 MG IJ SOLR: 2.0000 mg | Freq: Once | INTRAMUSCULAR | Status: DC | PRN

## 2022-02-09 MED ORDER — ACETAMINOPHEN 325 MG PO TABS
650.0000 mg | ORAL_TABLET | Freq: Four times a day (QID) | ORAL | Status: DC | PRN
  Administered 2022-02-10 (×2): 650 mg via ORAL
  Filled 2022-02-09 (×2): qty 2

## 2022-02-09 MED ORDER — CINACALCET HCL 30 MG PO TABS
90.0000 mg | ORAL_TABLET | Freq: Every morning | ORAL | Status: DC
  Administered 2022-02-10 – 2022-02-11 (×2): 90 mg via ORAL
  Filled 2022-02-09 (×2): qty 3

## 2022-02-09 MED ORDER — SENNOSIDES 8.8 MG/5ML PO SYRP
5.0000 mL | ORAL_SOLUTION | Freq: Two times a day (BID) | ORAL | Status: DC
  Administered 2022-02-11: 5 mL
  Filled 2022-02-09 (×4): qty 5

## 2022-02-09 MED ORDER — LIDOCAINE HCL (PF) 1 % IJ SOLN: 5.0000 mL | INTRAMUSCULAR | Status: DC | PRN

## 2022-02-09 MED ORDER — DEXTROSE 50 % IV SOLN
1.0000 | Freq: Once | INTRAVENOUS | Status: AC
Start: 2022-02-09 — End: 2022-02-09
  Administered 2022-02-09: 50 mL via INTRAVENOUS
  Filled 2022-02-09: qty 50

## 2022-02-09 MED ORDER — INSULIN ASPART 100 UNIT/ML IJ SOLN: 0.0000 [IU] | Freq: Three times a day (TID) | INTRAMUSCULAR | Status: DC

## 2022-02-09 MED ORDER — PENTAFLUOROPROP-TETRAFLUOROETH EX AERO: 1.0000 | INHALATION_SPRAY | CUTANEOUS | Status: DC | PRN

## 2022-02-09 NOTE — ED Notes (Signed)
ED Provider at bedside. 

## 2022-02-09 NOTE — Assessment & Plan Note (Signed)
 #)   History of dysphagia: In the context of embolic stroke in February 2023, with documentation suggestive prn for a syndrome in March 2023, the patient underwent G-tube placement on 10/30/2021.  She reports ensuing improvement in her dysphagia, noting that she now tolerates a nonmodified diet by mouth, noting that she receives none of her nutrition or fluids via the G-tube, but that she does continue to take her medications via the G-tube.   Plan: Nursing bedside swallow screen prior to initiation of renal diet.  Continue administration of outpatient medications via G-tube, as indicated by the patient.

## 2022-02-09 NOTE — ED Notes (Signed)
IV team at bedside 

## 2022-02-09 NOTE — H&P (Signed)
History and Physical    PLEASE NOTE THAT DRAGON DICTATION SOFTWARE WAS USED IN THE CONSTRUCTION OF THIS NOTE.   [name] TDD:[id number] DOB: 1981-03-08 DOA: 02/09/2022  PCP: [facility]  Patient coming from: SNF  I have personally briefly reviewed patient's old medical records in [place]  Chief Complaint: Nausea  HPI: [name] is a 41 y.o. 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   The patient reports 1 to 2 days nausea and diminished appetite in the absence of any associated vomiting.  She confirms a history of end-stage renal disease on hemodialysis, Monday, Wednesday, Friday schedule, and notes that she missed 2 of the last 3 hemodialysis sessions.  Specifically, she missed her hemodialysis sessions on Monday, 02/05/2022 as well as earlier today, 02/09/2022, while conveying that she attended her scheduled HD session on Wednesday, 02/07/2022.  Denies any associated chest pain, shortness of breath, orthopnea, PND, or new onset peripheral edema.  Not associate with any diaphoresis, palpitations, presyncope, or syncope.  No recent subjective fever, chills, rigors, or generalized myalgias.  No recent abdominal pain, diarrhea, melena, or hematochezia.  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  No documented history of atrial fibrillation.  It appears however chart review, that she also has a history of  Guillain-Barr syndrome, and that she underwent G-tube placement on 10/30/2021.  The patient conveys interval improvement in her associated dysphagia, noting that she now tolerates a nonmodified diet by mouth, noting that she receives none of her nutrition or fluids via the G-tube, but that she does continue to take her medications via the G-tube.   Pressure reviewed, no documented history of formally diagnosed hypertension, rather she is prescribed midodrine.     ED Course:  Vital signs in the ED were notable for the following: Afebrile; heart rate 97-1 04; initial blood pressure 118/92, which subsequently decreased to 159/77 following interval administration of Zofran with corresponding improvement in her presenting nausea; respiratory rate 15-18, oxygen saturation 96 to 100% on room air.  VBG showed 7.391/49.8.  Labs were notable for the following: CMP notable for the following: Potassium 7.4, without evidence of associated hemolysis, bicarbonate 26, anion gap 19, glucose 108, calcium, adjusted for mild hypoalbuminemia noted to be 10.3,-2.8, otherwise liver enzymes found to be within normal limits.  CBC notable for white cell count 10,600, hemoglobin 11.1 compared to most recent profile 11.2 on 11/23/2021.  Imaging and additional notable ED work-up: EKG showed sinus rhythm with heart rate 96, borderline prolongation of QTc, noted to be 496 ms , without overt T wave changes, also showing possible millimeter ST depression in inferior leads, which showed no evidence of ST elevation.  Chest x-ray showed no evidence of acute cardiopulmonary process including no evidence of infiltrate, edema, effusion, or pneumothorax.  In the setting of patient's report of new onset nausea, CT abdomen/pelvis without contrast was pursued, and in comparison to most recent prior CT from 10/25/2021 showed no evidence of acute intra-abdominal or acute intrapelvic process.  Today's CT  scanning also showed gallbladder stones versus  sludge, within the gallbladder itself, without radiographic evidence of acute cholecystitis, nor any evidence of choledocholithiasis or common bile duct dilation.  Today CT abdomen/pelvis also showed interval placement of G-tube.  EDP discussed the patient's case with the on-call nephrologist, Dr. Joylene Grapes, Who will formally consult, and is planning for hemodialysis tonight (6/30).  While in the ED, the following were administered: Zofran 4 mg IV x1, NovoLog 5 units IV x1, amp of D50 x1 dose, low, 10 g p.o. x1, calcium gluconate 1 g IV x1.  Subsequently, the patient was admitted for overnight observation to PCU for further evaluation management of hyperkalemia in the setting of multiple missed hemodialysis sessions over the course of the last week, after presenting with complaint of new onset nausea.    Review of Systems: As per HPI otherwise 10 point review of systems negative.   Past Medical History:  Diagnosis Date   Anemia    Anxiety    Blind right eye 2008   Diabetes mellitus without complication [facility])    Dialysis patient ([place])    Embolic stroke ([place])    ESRD (end stage renal disease) ([place])    HD on M/W/F   GBS (Guillain Barre syndrome) [facility])     Past Surgical History:  Procedure Laterality Date   AMPUTATION Right 08/11/2021   Procedure: TRANSMETATARSAL AMPUTATION OF RIGHT FOOT;  Surgeon: Serafina Mitchell, MD;  Location: [place];  Service: Vascular;  Laterality: Right;   AMPUTATION Right 09/17/2021   Procedure: AMPUTATION RIGHT BELOW KNEE;  Surgeon: Serafina Mitchell, MD;  Location: [place];  Service: Vascular;  Laterality: Right;   AMPUTATION Right 10/11/2021   Procedure: RIGHT ABOVE KNEE AMPUTATION;  Surgeon: Serafina Mitchell, MD;  Location: [place];  Service: Vascular;  Laterality: Right;   AMPUTATION TOE Left    APPLICATION OF WOUND VAC  08/16/2021   Procedure: APPLICATION OF WOUND VAC;  Surgeon: Serafina Mitchell, MD;  Location: [place];  Service: Vascular;;   AV FISTULA PLACEMENT      BUBBLE STUDY  10/02/2021   Procedure: BUBBLE STUDY;  Surgeon: Fay Records, MD;  Location: [facility] ENDOSCOPY;  Service: Cardiovascular;;   CESAREAN SECTION  2011   FISTULA SUPERFICIALIZATION Right 70/96/2836   Procedure: 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;  Surgeon: Angelia Mould, MD;  Location: [place];  Service: Vascular;  Laterality: Right;   INSERTION OF DIALYSIS CATHETER Left 07/27/2019   Procedure: INSERTION OF TUNNELED  DIALYSIS CATHETER;  Surgeon: Angelia Mould, MD;  Location: [place];  Service: Vascular;  Laterality: Left;   INSERTION OF DIALYSIS CATHETER Left 10/23/2021   Procedure: INSERTION OF TUNNELED PALINDROME PRECISION DIALYSIS CATHETER (23cm);  Surgeon: Marty Heck, MD;  Location: [facility] OR;  Service: Vascular;  Laterality: Left;   IR FLUORO GUIDE CV LINE LEFT  10/16/2021   IR FLUORO GUIDE CV LINE RIGHT  10/11/2021   IR GASTROSTOMY TUBE MOD SED  10/30/2021   IR US GUIDE VASC ACCESS LEFT  10/16/2021   IR US GUIDE VASC ACCESS RIGHT  10/11/2021   LOWER EXTREMITY ANGIOGRAPHY N/A 08/11/2021   Procedure: LOWER EXTREMITY ANGIOGRAPHY;  Surgeon: Serafina Mitchell, MD;  Location: [place] CV LAB;  Service: Cardiovascular;  Laterality: N/A;   REVISON OF ARTERIOVENOUS FISTULA Right 10/23/2021   Procedure: REVISON OF ARTERIOVENOUS FISTULA  ARM AND PLICATION;  Surgeon: Marty Heck, MD;  Location: [place];  Service: Vascular;  Laterality: Right;  TEE WITHOUT CARDIOVERSION N/A 10/02/2021   Procedure: TRANSESOPHAGEAL ECHOCARDIOGRAM (TEE);  Surgeon: Fay Records, MD;  Location: [facility] ENDOSCOPY;  Service: Cardiovascular;  Laterality: N/A;   TRANSMETATARSAL AMPUTATION Right 08/16/2021   Procedure: CLOSURE OF RIGHT TRANSMETATARSAL AMPUTATION;  Surgeon: Serafina Mitchell, MD;  Location: [place];  Service: Vascular;  Laterality: Right;    Social History:  reports that she has never smoked. She has never used smokeless tobacco. She reports that she does not drink alcohol  and does not use drugs.   Allergies  Allergen Reactions   Morphine Rash and Other (See Comments)   Peanut-Containing Drug Products Anaphylaxis and Hives   Penicillins Rash    Rash in 2008.  Tolerated cefazolin in 2020   Chocolate Hives    Family History  Problem Relation Age of Onset   Diabetes Mother    Diabetes Father     Family history reviewed and not pertinent    Prior to Admission medications   Medication Sig Start Date End Date Taking? Authorizing Provider  Accu-Chek FastClix Lancets MISC Use as instructed to check blood sugar up to TID. E11.22 Patient taking differently: 1 each by Other route See admin instructions. Use as instructed to check blood sugar up to TID. E11.22 04/15/19   Charlott Rakes, MD  atorvastatin (LIPITOR) 40 MG tablet Place 1 tablet (40 mg total) into feeding tube daily. Patient taking differently: Place 40 mg into feeding tube every morning. 10/26/21   Shelly Coss, MD  Biotin 1000 MCG tablet Place 1,000 mcg into feeding tube every morning.    [provider]  Blood Glucose Monitoring Suppl (ACCU-CHEK GUIDE ME) w/Device KIT 1 kit by Does not apply route 3 (three) times daily. Use to check BG at home up to 3 times daily. E11.22 04/15/19   Charlott Rakes, MD  calcitRIOL (ROCALTROL) 0.25 MCG capsule Place 0.25 mcg into feeding tube 2 (two) times daily.    [provider]  cinacalcet (SENSIPAR) 90 MG tablet Place 90 mg into feeding tube every morning. 09/22/19   [provider]  Continuous Blood Gluc Receiver (DEXCOM G6 RECEIVER) DEVI 1 Device by Does not apply route as directed. 09/12/20   Shamleffer, Melanie Crazier, MD  Continuous Blood Gluc Sensor (DEXCOM G6 SENSOR) MISC 1 Device by Does not apply route as directed. 09/12/20   Shamleffer, Melanie Crazier, MD  Continuous Blood Gluc Transmit (DEXCOM G6 TRANSMITTER) MISC 1 Device by Does not apply route as directed. 09/12/20   Shamleffer, Melanie Crazier, MD  dorzolamide-timolol  (COSOPT) 22.3-6.8 MG/ML ophthalmic solution Place 1 drop into the left eye 2 (two) times daily. 03/30/21   [provider]  epoetin alfa-epbx (RETACRIT) 38101 UNIT/ML injection 10,000 Units every Monday, Wednesday, and Friday with hemodialysis. Intramuscularly per order summary report    [provider]  glucose blood (ACCU-CHEK GUIDE) test strip Use as instructed to check blood sugar up to TID. E11.22 Patient taking differently: 1 each by Other route See admin instructions. Use as instructed to check blood sugar up to TID. E11.22 07/17/19   Fulp, Cammie, MD  heparin 5000 UNIT/ML injection Inject 1 mL (5,000 Units total) into the skin every 8 (eight) hours. Patient not taking: Reported on 11/23/2021 10/28/21   Shelly Coss, MD  HYDROmorphone (DILAUDID) 1 MG/ML injection Inject 0.5 mLs (0.5 mg total) into the vein every 4 (four) hours as needed for severe pain. Patient not taking: Reported on 11/23/2021 10/25/21   Shelly Coss, MD  insulin aspart (NOVOLOG) 100 UNIT/ML  injection Inject 0-6 Units into the skin every 4 (four) hours. Patient not taking: Reported on 11/23/2021 10/25/21   Shelly Coss, MD  insulin glargine-yfgn (SEMGLEE) 100 UNIT/ML injection Inject 0.05 mLs (5 Units total) into the skin 2 (two) times daily. 10/25/21   Shelly Coss, MD  Insulin Pen Needle 32G X 4 MM MISC 1 Device by Does not apply route as directed. 05/13/20   Shamleffer, Melanie Crazier, MD  midodrine (PROAMATINE) 5 MG tablet Take 1 tablet (5 mg total) by mouth 3 (three) times daily with meals. Patient taking differently: Place 5 mg into feeding tube 3 (three) times daily with meals. 10/25/21   Shelly Coss, MD  Multiple Vitamin (MULTIVITAMIN WITH MINERALS) TABS tablet Place 1 tablet into feeding tube every morning.    [provider]  nutrition supplement, JUVEN, (JUVEN) PACK Place 1 packet into feeding tube 2 (two) times daily between meals. Patient not taking: Reported on 11/23/2021 10/25/21    Shelly Coss, MD  Nutritional Supplement LIQD Take 30 mLs by mouth daily. Pro Heal    [provider]  Nutritional Supplements (FEEDING SUPPLEMENT, NEPRO CARB STEADY,) LIQD Place 960 mLs into feeding tube daily. Patient taking differently: 30 mLs by Per J Tube route every 2 (two) hours. 10/25/21   Shelly Coss, MD  Nutritional Supplements (FEEDING SUPPLEMENT, PROSOURCE TF,) liquid Place 45 mLs into feeding tube 2 (two) times daily. Patient not taking: Reported on 11/23/2021 10/25/21   Shelly Coss, MD  nystatin (MYCOSTATIN) 100000 UNIT/ML suspension Take 5 mLs (500,000 Units total) by mouth 4 (four) times daily. Patient not taking: Reported on 11/23/2021 10/25/21   Shelly Coss, MD  oxyCODONE-acetaminophen (PERCOCET) 5-325 MG tablet Take 1 tablet by mouth every 6 (six) hours as needed for severe pain. 09/04/21   Ulyses Amor, PA-C  sennosides (SENOKOT) 8.8 MG/5ML syrup Place 5 mLs into feeding tube 2 (two) times daily. 10/25/21   Shelly Coss, MD  thiamine 100 MG tablet Place 1 tablet (100 mg total) into feeding tube daily. Patient not taking: Reported on 11/23/2021 10/26/21   Shelly Coss, MD  Water For Irrigation, Sterile (FREE WATER) SOLN Place 30 mLs into feeding tube every 4 (four) hours. Patient not taking: Reported on 11/23/2021 10/25/21   Shelly Coss, MD     Objective    Physical Exam: Vitals:   02/09/22 1800 02/09/22 1903 02/09/22 1930 02/09/22 1930  BP: (!) 147/82 (!) 173/83 (!) 159/77   Pulse: (!) 103 98 97   Resp: (!) _0 Temp:      TempSrc:      SpO2: 96% 99% 98% 99%  Weight:      Height:        General: appears to be stated age; alert, oriented Skin: warm, dry, no rash Head:  AT/[place] Mouth:  Oral mucosa membranes appear moist, normal dentition Neck: supple; trachea midline Heart:  RRR; did not appreciate any M/R/G Lungs: CTAB, did not appreciate any wheezes, rales, or rhonchi Abdomen: + BS; soft, ND, NT Vascular: 2+ pedal pulses b/l;  2+ radial pulses b/l Extremities: no peripheral edema, no muscle wasting Neuro: strength and sensation intact in upper and lower extremities b/l     Labs on Admission: I have personally reviewed following labs and imaging studies  CBC: Recent Labs  Lab 02/09/22 1636 02/09/22 1643  WBC 10.6*  --   NEUTROABS 8.3*  --   HGB 11.1* 13.3  HCT 37.6 39.0  MCV 79.5*  --   PLT  129*  --    Basic Metabolic Panel: Recent Labs  Lab 02/09/22 1636 02/09/22 1643  NA 138 135  K 7.4* 6.9*  CL 93*  --   CO2 26  --   GLUCOSE 108*  --   BUN 58*  --   CREATININE 8.30*  --   CALCIUM 9.4  --    GFR: Estimated Creatinine Clearance: 7.8 mL/min (A) (by C-G formula based on SCr of 8.3 mg/dL (H)). Liver Function Tests: Recent Labs  Lab 02/09/22 1636  AST 25  ALT 15  ALKPHOS 81  BILITOT 0.9  PROT 7.8  ALBUMIN 2.8*   Recent Labs  Lab 02/09/22 1636  LIPASE 32   No results for input(s): "AMMONIA" in the last 168 hours. Coagulation Profile: No results for input(s): "INR", "PROTIME" in the last 168 hours. Cardiac Enzymes: No results for input(s): "CKTOTAL", "CKMB", "CKMBINDEX", "TROPONINI" in the last 168 hours. BNP (last 3 results) No results for input(s): "PROBNP" in the last 8760 hours. HbA1C: Recent Labs    02/09/22 1636  HGBA1C 5.7*   CBG: Recent Labs  Lab 02/09/22 1900  GLUCAP 124*   Lipid Profile: No results for input(s): "CHOL", "HDL", "LDLCALC", "TRIG", "CHOLHDL", "LDLDIRECT" in the last 72 hours. Thyroid Function Tests: No results for input(s): "TSH", "T4TOTAL", "FREET4", "T3FREE", "THYROIDAB" in the last 72 hours. Anemia Panel: No results for input(s): "VITAMINB12", "FOLATE", "FERRITIN", "TIBC", "IRON", "RETICCTPCT" in the last 72 hours. Urine analysis:    Component Value Date/Time   COLORURINE YELLOW 02/21/2019 1955   APPEARANCEUR TURBID (A) 02/21/2019 1955   LABSPEC 1.012 02/21/2019 1955   PHURINE 7.0 02/21/2019 1955   GLUCOSEU >=500 (A) 02/21/2019 1955    HGBUR MODERATE (A) 02/21/2019 1955   BILIRUBINUR NEGATIVE 02/21/2019 [place] NEGATIVE 02/21/2019 1955   PROTEINUR 100 (A) 02/21/2019 1955   NITRITE NEGATIVE 02/21/2019 1955   LEUKOCYTESUR MODERATE (A) 02/21/2019 1955    Radiological Exams on Admission: CT ABDOMEN PELVIS WO CONTRAST  Result Date: 02/09/2022 CLINICAL DATA:  Nonlocalized pain EXAM: CT ABDOMEN AND PELVIS WITHOUT CONTRAST TECHNIQUE: Multidetector CT imaging of the abdomen and pelvis was performed following the standard protocol without IV contrast. RADIATION DOSE REDUCTION: This exam was performed according to the departmental dose-optimization program which includes automated exposure control, adjustment of the mA and/or kV according to patient size and/or use of iterative reconstruction technique. COMPARISON:  CT 10/25/2021, 09/26/2021, radiograph 11/16/2021 FINDINGS: Lower chest: Lung bases demonstrate no acute airspace disease. 1.2 x 1 cm heterogeneous nodular density in the posterior right base, series 5, image 25, appears slightly increased compared to prior. Trace right-sided effusion. Hepatobiliary: Hyperdense sludge or stones in the gallbladder. No focal hepatic abnormality. No biliary dilatation. Pancreas: No inflammatory changes. Spleen: Normal in size without focal abnormality. Adrenals/Urinary Tract: Adrenal glands are within normal limits. Kidneys show no hydronephrosis. Extensive renal vascular calcifications. Hyperdense bladder wall, some of which could be due to calcified vessels. Stomach/Bowel: Interval percutaneous gastrostomy tube with balloon in the distal stomach. Radiopaque material within the posterior gastric lumen. No dilated small bowel. No acute bowel wall thickening. Negative appendix. Moderate rectal distension by feces. Some perirectal thickening. Vascular/Lymphatic: Extensive vascular calcifications. No suspicious lymph nodes Reproductive: Uterus and bilateral adnexa are unremarkable. Other: Negative  for pelvic effusion or free air. Musculoskeletal: No acute osseous abnormality IMPRESSION: 1. No definite CT evidence for acute intra-abdominal or pelvic abnormality 2. Stones versus hyperdense tumefactive sludge in the gallbladder without convincing inflammation by CT 3. Heterogeneous slightly nodular  airspace disease in the posterior right lung base. Follow-up non-contrast CT recommended at 3-6 months to confirm persistence. If unchanged, and solid component remains <6 mm, annual CT is recommended until 5 years of stability has been established. If persistent these nodules should be considered highly suspicious if the solid component of the nodule is 6 mm or greater in size and enlarging. This recommendation follows the consensus statement: Guidelines for Management of Incidental Pulmonary Nodules Detected on CT Images: From the Fleischner Society 2017; Radiology 2017; 284:228-243. 4. Interval placement of gastrostomy tube without adverse features. 5. Mild rectal wall thickening with moderate rectal distension by feces Electronically Signed   By: Donavan Foil M.D.   On: 02/09/2022 18:54   DG Chest Portable 1 View  Result Date: 02/09/2022 CLINICAL DATA:  htn, abd pain EXAM: PORTABLE CHEST 1 VIEW COMPARISON:  Chest x-ray march 22, 23. FINDINGS: The heart size and mediastinal contours are within normal limits. Both lungs are clear. No visible pleural effusions or pneumothorax. No acute osseous abnormality. IMPRESSION: No active disease. Electronically Signed   By: Margaretha Sheffield M.D.   On: 02/09/2022 15:29     EKG: Independently reviewed, with result as described above.    Assessment/Plan   Principal Problem:   Hyperkalemia Active Problems:   Anemia of chronic disease   Diabetes mellitus (HCC)   HLD (hyperlipidemia)   ESRD on hemodialysis (HCC)   Nausea   History of dysphagia   Elevated blood pressure reading      #) Hyperkalemia: presenting serum potassium level of 7.4, without  associated evidence of hemolysis.  This is in the context of the patient missing 2 of her last 3 scheduled hemodialysis sessions in the setting of end-stage renal disease.  No overt pharmacologic contributions via outpatient medications.  Mild peaking of T waves on presenting EKG, without associated evidence of short P waves.  Of note, presenting VBG not consistent with metabolic acidosis, and normal bicarbonate noted on presenting CMP.  Case was discussed with the on-call nephrologist, Dr. Joylene Grapes, Who will formally consult and is planning for hemodialysis this evening (6/30).  Leading up to definitive management via hemodialysis, patient was also received a dose of will, this evening in addition to potassium shifting medical management via dose of IV insulin, as well as dose of calcium gluconate for cardioprotective purposes.   Plan: monitor on telemetry. Add-on serum Mg level. Check phosphorus.  Nephrology consulted, plan for hemodialysis this evening, as further detailed above.  I have ordered repeat renal function panel to be checked at 1 AM to further trend serum potassium level.  Repeat CMP in the morning.  Repeat EKG ordered for the morning.        #) Nausea: 1 to 2 days of new onset nausea associated with diminished appetite, but in the absence of any associated vomiting.  Suspect that this is a consequence of relative uremia after missing 2 out of her 3 most recent scheduled hemodialysis sessions, as above.  Not associate with any diarrhea or abdominal discomfort.  CT abdomen/pelvis without any evidence of acute intra-abdominal or acute intrapelvic process, as further detailed above.  Nausea is improved following single dose of IV Zofran in the ED.  However, given report along QTc prolongation, will provide prn IV Ativan as prn antiemetic for now, with plan to repeat EKG in the morning to further trend degree of QTc prolongation.  Plan: Prn IV Ativan, as above.  Monitor strict I's and O's  Daily weights.  Hemodialysis  anticipated for this evening following nephrology consultation due to in the near relative contribution from uremia.  That also may be some low-grade repeat CMP in the morning.        #) Elevated blood pressure: In the absence of any formally established diagnosis of hypertension, although, per chart review, there is documentation of a prior episode of hypertensive crisis.  Not currently on any antihypertensive medications as an outpatient, rather she has been on scheduled midodrine.  Presenting blood pressure today noted to be 218/92, with ensuing improvement 159/77 following improvement in nausea via single dose of IV Zofran, and in the absence of any interval antihypertensive medication administration.  Confounding variable in evaluating residual blood pressure at this stems from the patient missing 2 out of her 3 most recent hemodialysis sessions.  We will reevaluate patient's blood pressure following hemodialysis session that is anticipated to occur this evening.  Plan: Close monitoring and symptom pressure via routine vital signs.  Hemodialysis anticipated for this evening, as above.  Hold midodrine for now.            #) ESRD: on HD (schedule: Monday, Wednesday, Friday).  Most recent [facility] attended hemodialysis session occurred on Wednesday, 02/07/2022, after missing her scheduled HD sessions on the preceding Monday following Friday, as detailed above, associated with presenting hyperkalemia, prompting consultation with on-call nephrology presents in clinic for hemodialysis this evening, as further detailed above.  Potential early uremic changes in the form of 1 to 2 days with nausea loss of appetite.  Associated with evidence of anion gap metabolic acidosis or acute volume overload.  outpatient medical management for her end-stage renal disease also includes Sensipar and calcitriol.    Plan: Nephrology consulted, plan for hemodialysis this evening.  Monitor  strict I's/O's, daily weights.  Repeat renal function panel 1 AM on 02/10/2022, as above, particularly to monitor ensuing trend of serum potassium level.  CMP in the AM. Check mag and phos levels.  Resume home calcitriol and Sensipar.          #) Anemia of chronic kidney disease: Documented history of such, associated baseline hemoglobin range of 9-11, presenting hemoglobin found to be consistent with this range.  No evidence of active bleed at this time.  Plan: will repeat CBC in the morning.           #) Type 2 Diabetes Mellitus: documented history of such. Home insulin regimen: Insulin glargine 5 units SQ twice daily. Home oral hypoglycemic agents: None. presenting blood sugar: 108, with ensuing CBG checked following administration of 5 units of IV insulin along with amp D50 noted to be 124.  In the setting of the patient's recent nausea, will hold home low-dose basal insulin for now, close monitoring blood sugars via scheduled Accu-Cheks and associated sliding scale coverage, as further detailed below.  It appears that her diabetes is poorly controlled at baseline, most recent hemoglobin A1c noted to be 10.4% in December 2022.  Plan: accuchecks QAC and HS with low dose SSI.  Holding home basal insulin for now, as above.  Add on hemoglobin A1c level.           #) Hyperlipidemia: documented h/o such. On high intensity atorvastatin as outpatient.    Plan: continue home statin.           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  She reports ensuing improvement in her dysphagia, noting that she now tolerates a nonmodified diet by mouth, noting that she receives none of her nutrition or fluids via the G-tube, but that she does continue to take her medications via the G-tube.   Plan: Nursing bedside swallow screen prior  to initiation of renal diet.  Continue administration of outpatient medications via G-tube, as indicated by the patient.         DVT prophylaxis: SCD's   Code Status: Full code Family Communication: none Disposition Plan: Per Rounding Team Consults called: On-call nephrology, Dr. Joylene Grapes consulted, as further detailed above;  Admission status: Observation; PCU   PLEASE NOTE THAT DRAGON DICTATION SOFTWARE WAS USED IN THE CONSTRUCTION OF THIS NOTE.   Williamson DO [facility]  From [place]   02/09/2022, 8:53 PM

## 2022-02-09 NOTE — Assessment & Plan Note (Signed)
 #)   Type 2 Diabetes Mellitus: documented history of such. Home insulin regimen: Insulin glargine 5 units SQ twice daily. Home oral hypoglycemic agents: None. presenting blood sugar: 108, with ensuing CBG checked following administration of 5 units of IV insulin along with amp D50 noted to be 124.  In the setting of the patient's recent nausea, will hold home low-dose basal insulin for now, close monitoring blood sugars via scheduled Accu-Cheks and associated sliding scale coverage, as further detailed below.  It appears that her diabetes is poorly controlled at baseline, most recent hemoglobin A1c noted to be 10.4% in December 2022.  Plan: accuchecks QAC and HS with low dose SSI.  Holding home basal insulin for now, as above.  Add on hemoglobin A1c level.

## 2022-02-09 NOTE — Progress Notes (Signed)
Brief Nephrology Note  41 yo F ESRD missed dialysis Monday and today. Here with abd pain/diarrhea. K found to be in 7s. Provide with temporary hyperkalemia measures in the ER.  Will provide emergent dialysis tonight. If hyperkalemia has improved after dialysis no need for admission from nephrology perspective. Sounds like she may need admission for GI issues but defer that to ER and medicine.

## 2022-02-09 NOTE — Assessment & Plan Note (Signed)
 #)   Nausea: 1 to 2 days of new onset nausea associated with diminished appetite, but in the absence of any associated vomiting.  Suspect that this is a consequence of relative uremia after missing 2 out of her 3 most recent scheduled hemodialysis sessions, as above.  Not associate with any diarrhea or abdominal discomfort.  CT abdomen/pelvis without any evidence of acute intra-abdominal or acute intrapelvic process, as further detailed above.  Nausea is improved following single dose of IV Zofran in the ED.  However, given report along QTc prolongation, will provide prn IV Ativan as prn antiemetic for now, with plan to repeat EKG in the morning to further trend degree of QTc prolongation.  Plan: Prn IV Ativan, as above.  Monitor strict I's and O's Daily weights.  Hemodialysis anticipated for this evening following nephrology consultation due to in the near relative contribution from uremia.  That also may be some low-grade repeat CMP in the morning.

## 2022-02-09 NOTE — Assessment & Plan Note (Signed)
 #)   Elevated blood pressure: In the absence of any formally established diagnosis of hypertension, although, per chart review, there is documentation of a prior episode of hypertensive crisis.  Not currently on any antihypertensive medications as an outpatient, rather she has been on scheduled midodrine.  Presenting blood pressure today noted to be 218/92, with ensuing improvement 159/77 following improvement in nausea via single dose of IV Zofran, and in the absence of any interval antihypertensive medication administration.  Confounding variable in evaluating residual blood pressure at this stems from the patient missing 2 out of her 3 most recent hemodialysis sessions.  We will reevaluate patient's blood pressure following hemodialysis session that is anticipated to occur this evening.  Plan: Close monitoring and symptom pressure via routine vital signs.  Hemodialysis anticipated for this evening, as above.  Hold midodrine for now.

## 2022-02-09 NOTE — Assessment & Plan Note (Signed)
 #)   Hyperkalemia: presenting serum potassium level of 7.4, without associated evidence of hemolysis.  This is in the context of the patient missing 2 of her last 3 scheduled hemodialysis sessions in the setting of end-stage renal disease.  No overt pharmacologic contributions via outpatient medications.  Mild peaking of T waves on presenting EKG, without associated evidence of short P waves.  Of note, presenting VBG not consistent with metabolic acidosis, and normal bicarbonate noted on presenting CMP.  Case was discussed with the on-call nephrologist, Dr. Joylene Grapes, Who will formally consult and is planning for hemodialysis this evening (6/30).  Leading up to definitive management via hemodialysis, patient was also received a dose of will, this evening in addition to potassium shifting medical management via dose of IV insulin, as well as dose of calcium gluconate for cardioprotective purposes.   Plan: monitor on telemetry. Add-on serum Mg level. Check phosphorus.  Nephrology consulted, plan for hemodialysis this evening, as further detailed above.  I have ordered repeat renal function panel to be checked at 1 AM to further trend serum potassium level.  Repeat CMP in the morning.  Repeat EKG ordered for the morning.

## 2022-02-09 NOTE — Assessment & Plan Note (Signed)
   #)   Hyperlipidemia: documented h/o such. On high intensity atorvastatin as outpatient.    Plan: continue home statin.      

## 2022-02-09 NOTE — ED Triage Notes (Addendum)
Facility reports missed two dialysis appts this week but went on Wednesday missing Monday and today. Patient is more lethargic and HTN.  Patient complains of abd pain and nausea but unable to vomit.  Patient has known wound on sacrum.  Patient is no longer on feeding tubes per EMS and has not been eating very well per facility.;

## 2022-02-09 NOTE — ED Notes (Signed)
Pt brief changed, repositioned, transport taking pt to dialysis.

## 2022-02-09 NOTE — ED Provider Notes (Signed)
[place] EMERGENCY DEPARTMENT Provider Note   CSN: [id number] Arrival date & time: 02/09/22  1424     History  Chief Complaint  Patient presents with  . Hypertension  . Altered Mental Status    [name] is a 41 y.o. female.   Hypertension  Altered Mental Status   Patient has a history of multiple medical problems including diabetes, end-stage renal disease, blindness, anxiety.  Patient also has history lower extremity amputation and is primarily bedbound.  She resides at a skilled nursing facility.  Patient states she has not been feeling well this week.  She was hurting.  She did not go to dialysis on Monday.  She did go Wednesday but did not go again today.  Patient has been nauseated but really has not been vomiting.  She has not had any diarrhea or constipation.  She says the pain is primarily in her periumbilical region.  Staff at the nursing facility said that she has not been eating well.  Patient has a feeding tube in place but is now taking food orally  Home Medications Prior to Admission medications   Medication Sig Start Date End Date Taking? Authorizing Provider  Accu-Chek FastClix Lancets MISC Use as instructed to check blood sugar up to TID. E11.22 Patient taking differently: 1 each by Other route See admin instructions. Use as instructed to check blood sugar up to TID. E11.22 04/15/19   Charlott Rakes, MD  atorvastatin (LIPITOR) 40 MG tablet Place 1 tablet (40 mg total) into feeding tube daily. Patient taking differently: Place 40 mg into feeding tube every morning. 10/26/21   Shelly Coss, MD  Biotin 1000 MCG tablet Place 1,000 mcg into feeding tube every morning.    [provider]  Blood Glucose Monitoring Suppl (ACCU-CHEK GUIDE ME) w/Device KIT 1 kit by Does not apply route 3 (three) times daily. Use to check BG at home up to 3 times daily. E11.22 04/15/19   Charlott Rakes, MD  calcitRIOL (ROCALTROL) 0.25 MCG capsule Place 0.25  mcg into feeding tube 2 (two) times daily.    [provider]  cinacalcet (SENSIPAR) 90 MG tablet Place 90 mg into feeding tube every morning. 09/22/19   [provider]  Continuous Blood Gluc Receiver (DEXCOM G6 RECEIVER) DEVI 1 Device by Does not apply route as directed. 09/12/20   Shamleffer, Melanie Crazier, MD  Continuous Blood Gluc Sensor (DEXCOM G6 SENSOR) MISC 1 Device by Does not apply route as directed. 09/12/20   Shamleffer, Melanie Crazier, MD  Continuous Blood Gluc Transmit (DEXCOM G6 TRANSMITTER) MISC 1 Device by Does not apply route as directed. 09/12/20   Shamleffer, Melanie Crazier, MD  dorzolamide-timolol (COSOPT) 22.3-6.8 MG/ML ophthalmic solution Place 1 drop into the left eye 2 (two) times daily. 03/30/21   [provider]  epoetin alfa-epbx (RETACRIT) 40981 UNIT/ML injection 10,000 Units every Monday, Wednesday, and Friday with hemodialysis. Intramuscularly per order summary report    [provider]  glucose blood (ACCU-CHEK GUIDE) test strip Use as instructed to check blood sugar up to TID. E11.22 Patient taking differently: 1 each by Other route See admin instructions. Use as instructed to check blood sugar up to TID. E11.22 07/17/19   Fulp, Cammie, MD  heparin 5000 UNIT/ML injection Inject 1 mL (5,000 Units total) into the skin every 8 (eight) hours. Patient not taking: Reported on 11/23/2021 10/28/21   Shelly Coss, MD  HYDROmorphone (DILAUDID) 1 MG/ML injection Inject 0.5 mLs (0.5 mg total) into the vein  every 4 (four) hours as needed for severe pain. Patient not taking: Reported on 11/23/2021 10/25/21   Shelly Coss, MD  insulin aspart (NOVOLOG) 100 UNIT/ML injection Inject 0-6 Units into the skin every 4 (four) hours. Patient not taking: Reported on 11/23/2021 10/25/21   Shelly Coss, MD  insulin glargine-yfgn (SEMGLEE) 100 UNIT/ML injection Inject 0.05 mLs (5 Units total) into the skin 2 (two) times daily. 10/25/21   Shelly Coss, MD   Insulin Pen Needle 32G X 4 MM MISC 1 Device by Does not apply route as directed. 05/13/20   Shamleffer, Melanie Crazier, MD  midodrine (PROAMATINE) 5 MG tablet Take 1 tablet (5 mg total) by mouth 3 (three) times daily with meals. Patient taking differently: Place 5 mg into feeding tube 3 (three) times daily with meals. 10/25/21   Shelly Coss, MD  Multiple Vitamin (MULTIVITAMIN WITH MINERALS) TABS tablet Place 1 tablet into feeding tube every morning.    [provider]  nutrition supplement, JUVEN, (JUVEN) PACK Place 1 packet into feeding tube 2 (two) times daily between meals. Patient not taking: Reported on 11/23/2021 10/25/21   Shelly Coss, MD  Nutritional Supplement LIQD Take 30 mLs by mouth daily. Pro Heal    [provider]  Nutritional Supplements (FEEDING SUPPLEMENT, NEPRO CARB STEADY,) LIQD Place 960 mLs into feeding tube daily. Patient taking differently: 30 mLs by Per J Tube route every 2 (two) hours. 10/25/21   Shelly Coss, MD  Nutritional Supplements (FEEDING SUPPLEMENT, PROSOURCE TF,) liquid Place 45 mLs into feeding tube 2 (two) times daily. Patient not taking: Reported on 11/23/2021 10/25/21   Shelly Coss, MD  nystatin (MYCOSTATIN) 100000 UNIT/ML suspension Take 5 mLs (500,000 Units total) by mouth 4 (four) times daily. Patient not taking: Reported on 11/23/2021 10/25/21   Shelly Coss, MD  oxyCODONE-acetaminophen (PERCOCET) 5-325 MG tablet Take 1 tablet by mouth every 6 (six) hours as needed for severe pain. 09/04/21   Ulyses Amor, PA-C  sennosides (SENOKOT) 8.8 MG/5ML syrup Place 5 mLs into feeding tube 2 (two) times daily. 10/25/21   Shelly Coss, MD  thiamine 100 MG tablet Place 1 tablet (100 mg total) into feeding tube daily. Patient not taking: Reported on 11/23/2021 10/26/21   Shelly Coss, MD  Water For Irrigation, Sterile (FREE WATER) SOLN Place 30 mLs into feeding tube every 4 (four) hours. Patient not taking: Reported on 11/23/2021  10/25/21   Shelly Coss, MD      Allergies    Morphine, Peanut-containing drug products, Penicillins, and Chocolate    Review of Systems   Review of Systems  Physical Exam Updated Vital Signs BP (!) 159/77   Pulse 97   Temp 97.7 F (36.5 C) (Oral)   Resp 18   Ht 1.549 m (_0 )   Wt 65.8 kg   LMP  (LMP Unknown)   SpO2 99%   BMI 27.40 kg/m  Physical Exam Vitals and nursing note reviewed.  Constitutional:      Appearance: She is well-developed. She is ill-appearing.  HENT:     Head: Normocephalic and atraumatic.     Right Ear: External ear normal.     Left Ear: External ear normal.  Eyes:     General: No scleral icterus.       Right eye: No discharge.        Left eye: No discharge.     Conjunctiva/sclera: Conjunctivae normal.  Neck:     Trachea: No tracheal deviation.  Cardiovascular:     Rate  and Rhythm: Normal rate and regular rhythm.  Pulmonary:     Effort: Pulmonary effort is normal. No respiratory distress.     Breath sounds: Normal breath sounds. No stridor. No wheezing or rales.  Abdominal:     General: Bowel sounds are normal. There is no distension.     Palpations: Abdomen is soft.     Tenderness: There is abdominal tenderness. There is no guarding or rebound.  Musculoskeletal:        General: No tenderness or deformity.     Cervical back: Neck supple.     Comments: Status post lower extremity amputation left leg  Skin:    General: Skin is warm and dry.     Findings: No rash.  Neurological:     General: No focal deficit present.     Mental Status: She is alert and oriented to person, place, and time.     Cranial Nerves: No cranial nerve deficit (no facial droop, extraocular movements intact, no slurred speech).     Sensory: No sensory deficit.     Motor: No abnormal muscle tone or seizure activity.     Coordination: Coordination normal.  Psychiatric:        Mood and Affect: Mood normal.     ED Results / Procedures / Treatments   Labs (all  labs ordered are listed, but only abnormal results are displayed) Labs Reviewed  COMPREHENSIVE METABOLIC PANEL - Abnormal; Notable for the following components:      Result Value   Potassium 7.4 (*)    Chloride 93 (*)    Glucose, Bld 108 (*)    BUN 58 (*)    Creatinine, Ser 8.30 (*)    Albumin 2.8 (*)    GFR, Estimated 6 (*)    Anion gap 19 (*)    All other components within normal limits  CBC WITH DIFFERENTIAL/PLATELET - Abnormal; Notable for the following components:   WBC 10.6 (*)    Hemoglobin 11.1 (*)    MCV 79.5 (*)    MCH 23.5 (*)    MCHC 29.5 (*)    RDW 19.9 (*)    Platelets 129 (*)    Neutro Abs 8.3 (*)    All other components within normal limits  I-STAT VENOUS BLOOD GAS, ED - Abnormal; Notable for the following components:   Bicarbonate 30.2 (*)    Acid-Base Excess 4.0 (*)    Potassium 6.9 (*)    Calcium, Ion 1.06 (*)    All other components within normal limits  CBG MONITORING, ED - Abnormal; Notable for the following components:   Glucose-Capillary 124 (*)    All other components within normal limits  LIPASE, BLOOD  HEPATITIS B SURFACE ANTIGEN  HEPATITIS B SURFACE ANTIBODY,QUALITATIVE  HEPATITIS B SURFACE ANTIBODY, QUANTITATIVE  I-STAT BETA HCG BLOOD, ED ([facility], [facility], [facility] ONLY)    EKG EKG Interpretation  Date/Time:  Friday February 09 2022 14:34:29 EDT Ventricular Rate:  96 PR Interval:  153 QRS Duration: 88 QT Interval:  392 QTC Calculation: 496 R Axis:   27 Text Interpretation: Sinus rhythm Anteroseptal infarct, old Minimal ST depression, inferior leads increased qrs voltage since last tracing Confirmed by Dorie Rank [phone]) on 02/09/2022 3:03:06 PM  Radiology CT ABDOMEN PELVIS WO CONTRAST  Result Date: 02/09/2022 CLINICAL DATA:  Nonlocalized pain EXAM: CT ABDOMEN AND PELVIS WITHOUT CONTRAST TECHNIQUE: Multidetector CT imaging of the abdomen and pelvis was performed following the standard protocol without IV contrast. RADIATION DOSE REDUCTION: This exam was  performed according to the departmental dose-optimization program which includes automated exposure control, adjustment of the mA and/or kV according to patient size and/or use of iterative reconstruction technique. COMPARISON:  CT 10/25/2021, 09/26/2021, radiograph 11/16/2021 FINDINGS: Lower chest: Lung bases demonstrate no acute airspace disease. 1.2 x 1 cm heterogeneous nodular density in the posterior right base, series 5, image 25, appears slightly increased compared to prior. Trace right-sided effusion. Hepatobiliary: Hyperdense sludge or stones in the gallbladder. No focal hepatic abnormality. No biliary dilatation. Pancreas: No inflammatory changes. Spleen: Normal in size without focal abnormality. Adrenals/Urinary Tract: Adrenal glands are within normal limits. Kidneys show no hydronephrosis. Extensive renal vascular calcifications. Hyperdense bladder wall, some of which could be due to calcified vessels. Stomach/Bowel: Interval percutaneous gastrostomy tube with balloon in the distal stomach. Radiopaque material within the posterior gastric lumen. No dilated small bowel. No acute bowel wall thickening. Negative appendix. Moderate rectal distension by feces. Some perirectal thickening. Vascular/Lymphatic: Extensive vascular calcifications. No suspicious lymph nodes Reproductive: Uterus and bilateral adnexa are unremarkable. Other: Negative for pelvic effusion or free air. Musculoskeletal: No acute osseous abnormality IMPRESSION: 1. No definite CT evidence for acute intra-abdominal or pelvic abnormality 2. Stones versus hyperdense tumefactive sludge in the gallbladder without convincing inflammation by CT 3. Heterogeneous slightly nodular airspace disease in the posterior right lung base. Follow-up non-contrast CT recommended at 3-6 months to confirm persistence. If unchanged, and solid component remains <6 mm, annual CT is recommended until 5 years of stability has been established. If persistent these  nodules should be considered highly suspicious if the solid component of the nodule is 6 mm or greater in size and enlarging. This recommendation follows the consensus statement: Guidelines for Management of Incidental Pulmonary Nodules Detected on CT Images: From the Fleischner Society 2017; Radiology 2017; 284:228-243. 4. Interval placement of gastrostomy tube without adverse features. 5. Mild rectal wall thickening with moderate rectal distension by feces Electronically Signed   By: Donavan Foil M.D.   On: 02/09/2022 18:54   DG Chest Portable 1 View  Result Date: 02/09/2022 CLINICAL DATA:  htn, abd pain EXAM: PORTABLE CHEST 1 VIEW COMPARISON:  Chest x-ray march 22, 23. FINDINGS: The heart size and mediastinal contours are within normal limits. Both lungs are clear. No visible pleural effusions or pneumothorax. No acute osseous abnormality. IMPRESSION: No active disease. Electronically Signed   By: Margaretha Sheffield M.D.   On: 02/09/2022 15:29    Procedures Procedures    Medications Ordered in ED Medications  Chlorhexidine Gluconate Cloth 2 % PADS 6 each (has no administration in time range)  pentafluoroprop-tetrafluoroeth (GEBAUERS) aerosol 1 Application (has no administration in time range)  lidocaine (PF) (XYLOCAINE) 1 % injection 5 mL (has no administration in time range)  lidocaine-prilocaine (EMLA) cream 1 Application (has no administration in time range)  heparin injection 1,000 Units (has no administration in time range)  alteplase (CATHFLO ACTIVASE) injection 2 mg (has no administration in time range)  fentaNYL (SUBLIMAZE) injection 50 mcg (50 mcg Intravenous Given 02/09/22 1705)  ondansetron (ZOFRAN) injection 4 mg (4 mg Intravenous Given 02/09/22 1704)  sodium zirconium cyclosilicate (LOKELMA) packet 10 g (10 g Oral Given 02/09/22 1746)  calcium gluconate 1 g/ 50 mL sodium chloride IVPB (0 mg Intravenous Stopped 02/09/22 1746)  insulin aspart (novoLOG) injection 5 Units (5 Units  Intravenous Given 02/09/22 1719)    And  dextrose 50 % solution 50 mL (50 mLs Intravenous Given 02/09/22 1707)    ED Course/ Medical Decision Making/ A&P Clinical  Course as of 02/09/22 1948  Fri Feb 09, 2022  1648 Notified that the patient's i-STAT venous blood gas potassium is elevated at 6.9.  Unclear if this is hemolyzed as we are having some difficulty drawing her lab server the patient is also a renal failure patient and did not go to dialysis today [JK]  1701 DG Chest Portable 1 View Chest x-ray without acute findings [JK]  1827 Comprehensive metabolic panel(!!) Potassium elevated at 7.4.  BUN and creatinine elevated, will consult nephrology, patient previously treated for her hyperkalemia with calcium insulin dextrose Lokelma [JK]  1847 Case discussed with Dr Joylene Grapes.  Will arrange for dialysis [JK]  1913 CT scan report reviewed.  No acute findings, incidental findings noted including lung nodule [JK]  1945 Case discussed with Dr Velia Meyer regarding admission [JK]  1947 Pressure improving [JK]    Clinical Course User Index [JK] Dorie Rank, MD                           Medical Decision Making Problems Addressed: Chronic renal failure, stage 5 [facility]): chronic illness or injury with exacerbation, progression, or side effects of treatment Hyperkalemia: acute illness or injury that poses a threat to life or bodily functions Lung nodule: chronic illness or injury Nausea: acute illness or injury Weakness: acute illness or injury  Amount and/or Complexity of Data Reviewed Labs: ordered. Decision-making details documented in ED Course. Radiology: ordered. Decision-making details documented in ED Course. Discussion of management or test interpretation with external provider(s): Case discussed with nephrology and hospitalist service  Risk OTC drugs. Prescription drug management. Drug therapy requiring intensive monitoring for toxicity. Decision regarding hospitalization.   Patient  presented with complaints of nausea weakness.  Patient has complex medical history.  At baseline has multiple medical problems.  Patient did not go to dialysis on Monday or Friday this week.  She presents with acute worsening nausea and weakness.  Patient was initially hypertensive.  No fever to suggest acute infection or sepsis.  Patient was complaining of abdominal pain and did have tenderness on exam.  CT scan was performed and fortunately no signs of acute obstruction or other acute abdominal pathology.  Patient's labs were notable for severe hyperkalemia associated with her chronic kidney disease.  Patient was treated with IV calcium, insulin dextrose Lokelma.  Patient has remained stable in the ED.  She will need emergent dialysis.  Case was discussed with nephrology and the medical service for further treatment.         Final Clinical Impression(s) / ED Diagnoses Final diagnoses:  Hyperkalemia  Chronic renal failure, stage 5 (HCC)  Weakness  Lung nodule  Nausea    Rx / DC Orders ED Discharge Orders     None         Dorie Rank, MD 02/09/22 1948

## 2022-02-09 NOTE — Assessment & Plan Note (Signed)
   #)   Anemia of chronic kidney disease: Documented history of such, associated baseline hemoglobin range of 9-11, presenting hemoglobin found to be consistent with this range.  No evidence of active bleed at this time.  Plan: will repeat CBC in the morning.

## 2022-02-09 NOTE — Assessment & Plan Note (Signed)
 #)   ESRD: on HD (schedule: Monday, Wednesday, Friday).  Most recent Truman Hayward attended hemodialysis session occurred on Wednesday, 02/07/2022, after missing her scheduled HD sessions on the preceding Monday following Friday, as detailed above, associated with presenting hyperkalemia, prompting consultation with on-call nephrology presents in clinic for hemodialysis this evening, as further detailed above.  Potential early uremic changes in the form of 1 to 2 days with nausea loss of appetite.  Associated with evidence of anion gap metabolic acidosis or acute volume overload.  outpatient medical management for her end-stage renal disease also includes Sensipar and calcitriol.    Plan: Nephrology consulted, plan for hemodialysis this evening.  Monitor strict I's/O's, daily weights.  Repeat renal function panel 1 AM on 02/10/2022, as above, particularly to monitor ensuing trend of serum potassium level.  CMP in the AM. Check mag and phos levels.  Resume home calcitriol and Sensipar.

## 2022-02-10 DIAGNOSIS — E875 Hyperkalemia: Secondary | ICD-10-CM | POA: Diagnosis not present

## 2022-02-10 LAB — RENAL FUNCTION PANEL
Albumin: 2.9 g/dL — ABNORMAL LOW (ref 3.5–5.0)
Anion gap: 16 — ABNORMAL HIGH (ref 5–15)
BUN: 26 mg/dL — ABNORMAL HIGH (ref 6–20)
CO2: 27 mmol/L (ref 22–32)
Calcium: 9 mg/dL (ref 8.9–10.3)
Chloride: 93 mmol/L — ABNORMAL LOW (ref 98–111)
Creatinine, Ser: 4.57 mg/dL — ABNORMAL HIGH (ref 0.44–1.00)
GFR, Estimated: 12 mL/min — ABNORMAL LOW (ref >60)
Glucose, Bld: 84 mg/dL (ref 70–99)
Phosphorus: 3.2 mg/dL (ref 2.5–4.6)
Potassium: 5.7 mmol/L — ABNORMAL HIGH (ref 3.5–5.1)
Sodium: 136 mmol/L (ref 135–145)

## 2022-02-10 LAB — GLUCOSE, CAPILLARY
Glucose-Capillary: 69 mg/dL — ABNORMAL LOW (ref 70–99)
Glucose-Capillary: 119 mg/dL — ABNORMAL HIGH (ref 70–99)
Glucose-Capillary: 114 mg/dL — ABNORMAL HIGH (ref 70–99)

## 2022-02-10 LAB — CBC WITH DIFFERENTIAL/PLATELET
Abs Immature Granulocytes: 0.02 10*3/uL (ref 0.00–0.07)
Basophils Absolute: 0 10*3/uL (ref 0.0–0.1)
Basophils Relative: 1 %
Eosinophils Absolute: 0.3 10*3/uL (ref 0.0–0.5)
Eosinophils Relative: 5 %
HCT: 38.3 % (ref 36.0–46.0)
Hemoglobin: 11.7 g/dL — ABNORMAL LOW (ref 12.0–15.0)
Immature Granulocytes: 0 %
Lymphocytes Relative: 24 %
Lymphs Abs: 1.4 10*3/uL (ref 0.7–4.0)
MCH: 24.2 pg — ABNORMAL LOW (ref 26.0–34.0)
MCHC: 30.5 g/dL (ref 30.0–36.0)
MCV: 79.1 fL — ABNORMAL LOW (ref 80.0–100.0)
Monocytes Absolute: 0.2 10*3/uL (ref 0.1–1.0)
Monocytes Relative: 4 %
Neutro Abs: 3.9 10*3/uL (ref 1.7–7.7)
Neutrophils Relative %: 66 %
Platelets: 98 10*3/uL — ABNORMAL LOW (ref 150–400)
RBC: 4.84 MIL/uL (ref 3.87–5.11)
RDW: 19.8 % — ABNORMAL HIGH (ref 11.5–15.5)
WBC: 5.9 10*3/uL (ref 4.0–10.5)
nRBC: 0 % (ref 0.0–0.2)

## 2022-02-10 LAB — MAGNESIUM: Magnesium: 2 mg/dL (ref 1.7–2.4)

## 2022-02-10 MED ORDER — SODIUM ZIRCONIUM CYCLOSILICATE 10 G PO PACK
20.0000 g | PACK | Freq: Once | ORAL | Status: AC
  Administered 2022-02-10: 20 g via ORAL
  Filled 2022-02-10: qty 2

## 2022-02-10 MED ORDER — OXYCODONE-ACETAMINOPHEN 5-325 MG PO TABS
1.0000 | ORAL_TABLET | Freq: Four times a day (QID) | ORAL | Status: DC | PRN
  Administered 2022-02-10 – 2022-02-11 (×3): 1 via ORAL
  Filled 2022-02-10 (×3): qty 1
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
## 2022-02-10 NOTE — Progress Notes (Signed)
PROGRESS NOTE    [name]  DXA:[id number] DOB: 1981-06-14 DOA: 02/09/2022 PCP: [facility]  Outpatient Specialists:     Brief Narrative:  As per H&P done on admission:"[name] is a 41 y.o. 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    The patient reports 1 to 2 days nausea and diminished appetite in the absence of any associated vomiting.  She confirms a history of end-stage renal disease on hemodialysis, Monday, Wednesday, Friday schedule, and notes that she missed 2 of the last 3 hemodialysis sessions.  Specifically, she missed her hemodialysis sessions on Monday, 02/05/2022 as well as earlier today, 02/09/2022, while conveying that she attended her scheduled HD session on Wednesday, 02/07/2022.   Denies any associated chest pain, shortness of breath, orthopnea, PND, or new onset peripheral edema.  Not associate with any diaphoresis, palpitations, presyncope, or syncope.  No recent subjective fever, chills, rigors, or generalized myalgias.  No recent abdominal pain, diarrhea, melena, or hematochezia.   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  No documented history of atrial fibrillation.  It appears however chart review, that she also has a history of Guillain-Barr syndrome, and that she underwent G-tube placement on 10/30/2021.  The patient conveys interval improvement in her associated dysphagia, noting that she  now tolerates a nonmodified diet by mouth, noting that she receives none of her nutrition or fluids via the G-tube, but that she does continue to take her medications via the G-tube.    Pressure reviewed, no documented history of formally diagnosed hypertension, rather she is prescribed midodrine.        ED Course:  Vital signs in the ED were notable for the following: Afebrile; heart rate 97-1 04; initial blood pressure 118/92, which subsequently decreased to 159/77 following interval administration of Zofran with corresponding improvement in her presenting nausea; respiratory rate 15-18, oxygen saturation 96 to 100% on room air.  VBG showed 7.391/49.8.   Labs were notable for the following: CMP notable for the following: Potassium 7.4, without evidence of associated hemolysis, bicarbonate 26, anion gap 19, glucose 108, calcium, adjusted for mild hypoalbuminemia noted to be 10.3,-2.8, otherwise liver enzymes found to be within normal limits.  CBC notable for white cell count 10,600, hemoglobin 11.1 compared to most recent profile 11.2 on 11/23/2021.   Imaging and additional notable ED work-up: EKG showed sinus rhythm with heart rate 96, borderline prolongation of QTc, noted to be 496 ms , without overt T wave changes, also showing possible millimeter ST depression in inferior leads, which showed no evidence of ST elevation.  Chest x-ray showed no evidence of acute cardiopulmonary process including no evidence of infiltrate, edema, effusion, or pneumothorax.   In the setting of patient's report of new onset nausea, CT abdomen/pelvis without contrast was pursued, and in comparison to most recent prior CT from 10/25/2021 showed no evidence of acute intra-abdominal or acute intrapelvic process.  Today's CT scanning also showed gallbladder stones versus sludge, within the gallbladder itself, without radiographic evidence of acute cholecystitis,  nor any evidence of choledocholithiasis or common bile duct  dilation.  Today CT abdomen/pelvis also showed interval placement of G-tube.   EDP discussed the patient's case with the on-call nephrologist, Dr. Joylene Grapes, Who will formally consult, and is planning for hemodialysis tonight (6/30).   While in the ED, the following were administered: Zofran 4 mg IV x1, NovoLog 5 units IV x1, amp of D50 x1 dose, low, 10 g p.o. x1, calcium gluconate 1 g IV x1.   Subsequently, the patient was admitted for overnight observation to PCU for further evaluation management of hyperkalemia in the setting of multiple missed hemodialysis sessions over the course of the last week, after presenting with complaint of new onset nausea".  02/10/2022: Patient seen alongside patient's nurse.  Patient has not a particularly good historian.  Potassium is down to 5.7 after hemodialysis (from 7.4).  Lokelma also given by the nephrology team.  Communicated with the nephrology team regarding need for repeat hemodialysis.  Nephrology team does not plan to dialyze patient today.  Follow renal panel.  Also check renal panel in the morning.  Wound care consulted further wound care issues.  TOC team also consulted (to ensure patient has properly looked after and, appropriate the current facility).   Assessment & Plan:   Principal Problem:   Hyperkalemia Active Problems:   Anemia of chronic disease   Diabetes mellitus (HCC)   HLD (hyperlipidemia)   ESRD on hemodialysis (HCC)   Nausea   History of dysphagia   Elevated blood pressure reading   Hyperkalemia: -On presentation, potassium was 7.4. -Apparently, patient had missed hemodialysis on 2-3 occasions prior to presentation. -Patient has undergone emergent hemodialysis. -Potassium is down to 5.7. -Nephrology team is also administered Lokelma. -Continue to monitor renal function and electrolytes. -Communicated with nephrology team, no plan for further hemodialysis today. -Further management will depend on renal panel.  Hypertensive  urgency: -Blood pressure control has improved significantly. -Continue to monitor closely. -Goal blood pressure should be less than 130/80 mmHg.  ESRD on hemodialysis: -Patient gets dialyzed Monday, Wednesday and Friday. -Last hemodialysis was last night (emergently for hyperkalemia). -Post hemodialysis potassium level is 5.7. -Nephrology team is managing.  Milly Jakob has been given. -Continue to monitor renal function and electrolytes. -Avoid nephrotoxins. -Continue to monitor calcium, phosphorus, hemoglobin.  Anemia of chronic kidney disease: -Keep hemoglobin at goal.  Type 2 diabetes mellitus: -Currently on sliding scale insulin coverage. -Continue to monitor blood sugar closely.  Pressure ulcers: -Consult the wound care team.  DVT prophylaxis: SCD. Code Status: Full code Family Communication:  Disposition Plan: Consult TOC.  Patient be discharged back to skilled nursing facility eventually.   Consultants:  Nephrology. Wound care TOC  Procedures:  None  Antimicrobials:  None   Subjective: Patient is a poor historian.  Objective: Vitals:   02/10/22 0330 02/10/22 0428 02/10/22 0450 02/10/22 0747  BP:   (!) 150/99 132/77  Pulse:   100 (!) 106  Resp:   17 10  Temp: 98.4 F (36.9 C)  98.2 F (36.8 C) 97.7 F (36.5 C)  TempSrc: Oral  Oral Oral  SpO2:   90% 90%  Weight:  65.9 kg    Height:  5\' 1"  (1.549 m)      Intake/Output Summary (Last 24 hours) at 02/10/2022 1015 Last data filed at 02/10/2022 0330 Gross per 24 hour  Intake 50 ml  Output 2200 ml  Net -2150 ml   Filed Weights   02/09/22 1430 02/10/22 0428  Weight: 65.8 kg  65.9 kg    Examination:  General exam: Appears calm and comfortable, but chronically ill looking.  Patient is blind in right eye Respiratory system: Clear to auscultation. Respiratory effort normal. Cardiovascular system: S1 & S2 heard, Gastrointestinal system: Abdomen is nondistended, soft and nontender. No organomegaly or masses  felt. Normal bowel sounds heard. Central nervous system: Awake and alert.  Blind right eye.  Moves all extremities. Extremities: Status post right BKA.  The heel of left foot is covered  Data Reviewed: I have personally reviewed following labs and imaging studies  CBC: Recent Labs  Lab 02/09/22 1636 02/09/22 1643 02/10/22 0633  WBC 10.6*  --  5.9  NEUTROABS 8.3*  --  3.9  HGB 11.1* 13.3 11.7*  HCT 37.6 39.0 38.3  MCV 79.5*  --  79.1*  PLT 129*  --  98*   Basic Metabolic Panel: Recent Labs  Lab 02/09/22 1636 02/09/22 1643 02/10/22 0633  NA 138 135 136  K 7.4* 6.9* 5.7*  CL 93*  --  93*  CO2 26  --  27  GLUCOSE 108*  --  84  BUN 58*  --  26*  CREATININE 8.30*  --  4.57*  CALCIUM 9.4  --  9.0  MG  --   --  2.0  PHOS  --   --  3.2   GFR: Estimated Creatinine Clearance: 14.2 mL/min (A) (by C-G formula based on SCr of 4.57 mg/dL (H)). Liver Function Tests: Recent Labs  Lab 02/09/22 1636 02/10/22 0633  AST 25  --   ALT 15  --   ALKPHOS 81  --   BILITOT 0.9  --   PROT 7.8  --   ALBUMIN 2.8* 2.9*   Recent Labs  Lab 02/09/22 1636  LIPASE 32   No results for input(s): "AMMONIA" in the last 168 hours. Coagulation Profile: No results for input(s): "INR", "PROTIME" in the last 168 hours. Cardiac Enzymes: No results for input(s): "CKTOTAL", "CKMB", "CKMBINDEX", "TROPONINI" in the last 168 hours. BNP (last 3 results) No results for input(s): "PROBNP" in the last 8760 hours. HbA1C: Recent Labs    02/09/22 1636  HGBA1C 5.7*   CBG: Recent Labs  Lab 02/09/22 1900 02/10/22 0743  GLUCAP 124* 69*   Lipid Profile: No results for input(s): "CHOL", "HDL", "LDLCALC", "TRIG", "CHOLHDL", "LDLDIRECT" in the last 72 hours. Thyroid Function Tests: No results for input(s): "TSH", "T4TOTAL", "FREET4", "T3FREE", "THYROIDAB" in the last 72 hours. Anemia Panel: No results for input(s): "VITAMINB12", "FOLATE", "FERRITIN", "TIBC", "IRON", "RETICCTPCT" in the last 72  hours. Urine analysis:    Component Value Date/Time   COLORURINE YELLOW 02/21/2019 1955   APPEARANCEUR TURBID (A) 02/21/2019 1955   LABSPEC 1.012 02/21/2019 1955   PHURINE 7.0 02/21/2019 1955   GLUCOSEU >=500 (A) 02/21/2019 1955   HGBUR MODERATE (A) 02/21/2019 [place] NEGATIVE 02/21/2019 [place] NEGATIVE 02/21/2019 1955   PROTEINUR 100 (A) 02/21/2019 1955   NITRITE NEGATIVE 02/21/2019 1955   LEUKOCYTESUR MODERATE (A) 02/21/2019 1955   Sepsis Labs: @LABRCNTIP (procalcitonin:4,lacticidven:4)  )No results found for this or any previous visit (from the past 240 hour(s)).       Radiology Studies: CT ABDOMEN PELVIS WO CONTRAST  Result Date: 02/09/2022 CLINICAL DATA:  Nonlocalized pain EXAM: CT ABDOMEN AND PELVIS WITHOUT CONTRAST TECHNIQUE: Multidetector CT imaging of the abdomen and pelvis was performed following the standard protocol without IV contrast. RADIATION DOSE REDUCTION: This exam was performed according to the departmental dose-optimization program which includes  automated exposure control, adjustment of the mA and/or kV according to patient size and/or use of iterative reconstruction technique. COMPARISON:  CT 10/25/2021, 09/26/2021, radiograph 11/16/2021 FINDINGS: Lower chest: Lung bases demonstrate no acute airspace disease. 1.2 x 1 cm heterogeneous nodular density in the posterior right base, series 5, image 25, appears slightly increased compared to prior. Trace right-sided effusion. Hepatobiliary: Hyperdense sludge or stones in the gallbladder. No focal hepatic abnormality. No biliary dilatation. Pancreas: No inflammatory changes. Spleen: Normal in size without focal abnormality. Adrenals/Urinary Tract: Adrenal glands are within normal limits. Kidneys show no hydronephrosis. Extensive renal vascular calcifications. Hyperdense bladder wall, some of which could be due to calcified vessels. Stomach/Bowel: Interval percutaneous gastrostomy tube with balloon in  the distal stomach. Radiopaque material within the posterior gastric lumen. No dilated small bowel. No acute bowel wall thickening. Negative appendix. Moderate rectal distension by feces. Some perirectal thickening. Vascular/Lymphatic: Extensive vascular calcifications. No suspicious lymph nodes Reproductive: Uterus and bilateral adnexa are unremarkable. Other: Negative for pelvic effusion or free air. Musculoskeletal: No acute osseous abnormality IMPRESSION: 1. No definite CT evidence for acute intra-abdominal or pelvic abnormality 2. Stones versus hyperdense tumefactive sludge in the gallbladder without convincing inflammation by CT 3. Heterogeneous slightly nodular airspace disease in the posterior right lung base. Follow-up non-contrast CT recommended at 3-6 months to confirm persistence. If unchanged, and solid component remains <6 mm, annual CT is recommended until 5 years of stability has been established. If persistent these nodules should be considered highly suspicious if the solid component of the nodule is 6 mm or greater in size and enlarging. This recommendation follows the consensus statement: Guidelines for Management of Incidental Pulmonary Nodules Detected on CT Images: From the Fleischner Society 2017; Radiology 2017; 284:228-243. 4. Interval placement of gastrostomy tube without adverse features. 5. Mild rectal wall thickening with moderate rectal distension by feces Electronically Signed   By: Donavan Foil M.D.   On: 02/09/2022 18:54   DG Chest Portable 1 View  Result Date: 02/09/2022 CLINICAL DATA:  htn, abd pain EXAM: PORTABLE CHEST 1 VIEW COMPARISON:  Chest x-ray march 22, 23. FINDINGS: The heart size and mediastinal contours are within normal limits. Both lungs are clear. No visible pleural effusions or pneumothorax. No acute osseous abnormality. IMPRESSION: No active disease. Electronically Signed   By: Margaretha Sheffield M.D.   On: 02/09/2022 15:29        Scheduled Meds:   atorvastatin  40 mg Per Tube q morning   calcitRIOL  0.25 mcg Per Tube BID   Chlorhexidine Gluconate Cloth  6 each Topical Q0600   cinacalcet  90 mg Oral q morning   dorzolamide-timolol  1 drop Left Eye BID   insulin aspart  0-6 Units Subcutaneous TID WC   sennosides  5 mL Per Tube BID   sodium zirconium cyclosilicate  20 g Oral Once   Continuous Infusions:   LOS: 0 days    Time spent: 55 minutes.    Dana Allan, MD  [facility] Pager #: [phone] 7PM-7AM contact night coverage as above

## 2022-02-10 NOTE — Progress Notes (Signed)
Brief nephrology note  I saw the patient this morning.  She tolerated dialysis last night fairly well.  2.2 L of ultrafiltration.  Labs today demonstrate potassium is improved to 5.7.  Will provide Lokelma 20 g today.  Patient states she did not go to dialysis because she was feeling weak.  States that she has transport.  Patient is stable to discharge from nephrology perspective.  She should go to dialysis on Monday.  We can see the patient more formally tomorrow if the patient is planning to stay throughout the week but hopefully will be able to go home soon.

## 2022-02-10 NOTE — Plan of Care (Signed)
  Problem: Education: Goal: Ability to describe self-care measures that may prevent or decrease complications (Diabetes Survival Skills Education) will improve Outcome: Not Progressing   Problem: Skin Integrity: Goal: Risk for impaired skin integrity will decrease Outcome: Not Progressing

## 2022-02-11 LAB — RENAL FUNCTION PANEL
Albumin: 2.5 g/dL — ABNORMAL LOW (ref 3.5–5.0)
Anion gap: 12 (ref 5–15)
BUN: 31 mg/dL — ABNORMAL HIGH (ref 6–20)
CO2: 29 mmol/L (ref 22–32)
Calcium: 8.4 mg/dL — ABNORMAL LOW (ref 8.9–10.3)
Chloride: 96 mmol/L — ABNORMAL LOW (ref 98–111)
Creatinine, Ser: 5.42 mg/dL — ABNORMAL HIGH (ref 0.44–1.00)
GFR, Estimated: 10 mL/min — ABNORMAL LOW (ref >60)
Glucose, Bld: 154 mg/dL — ABNORMAL HIGH (ref 70–99)
Phosphorus: 3.9 mg/dL (ref 2.5–4.6)
Potassium: 4.1 mmol/L (ref 3.5–5.1)
Sodium: 137 mmol/L (ref 135–145)

## 2022-02-11 LAB — GLUCOSE, CAPILLARY: Glucose-Capillary: 104 mg/dL — ABNORMAL HIGH (ref 70–99)

## 2022-02-11 LAB — HEPATITIS B SURFACE ANTIBODY, QUANTITATIVE: Hep B S AB Quant (Post): >1000 m[IU]/mL (ref >9.9)

## 2022-02-11 MED ORDER — CHLORHEXIDINE GLUCONATE CLOTH 2 % EX PADS
6.0000 | MEDICATED_PAD | Freq: Every day | CUTANEOUS | 0 refills | Status: AC
Start: 2022-02-12 — End: 2022-03-14

## 2022-02-11 NOTE — TOC Transition Note (Signed)
Transition of Care [facility]) - CM/SW Discharge Note   Patient Details  Name: [name] MRN: [id number] Date of Birth: 12-11-1980  [facility] of [facility]) CM/SW Contact:  Ina Homes, [place] Phone Number: 02/11/2022, 1:23 PM   Clinical Narrative:     Per MD pt medically ready for d/c. SW confirmed with Otila Kluver [facility] [phone]) pt able to return today.  Call Report: [phone]  [facility] called  Final next level of care: Skilled Nursing Facility Barriers to Discharge: Barriers Resolved   Patient Goals and CMS Choice Patient states their goals for this hospitalization and ongoing recovery are:: return to SNF      Discharge Placement              Patient chooses bed at:  [facility]) Patient to be transferred to facility by: [facility]   Patient and family notified of of transfer: 02/11/22  Discharge Plan and Services                                     Social Determinants of Health (SDOH) Interventions     Readmission Risk Interventions    08/23/2021    3:42 PM  Readmission Risk Prevention Plan  Transportation Screening Complete  HRI or [place] Complete  Social Work Consult for [place] Planning/Counseling Complete  Palliative Care Screening Not Applicable  Medication Review [employer] photographer) Complete

## 2022-02-11 NOTE — Discharge Summary (Signed)
Physician Discharge Summary  Patient ID: [name] MRN: [id number] DOB/AGE: [birth year] 41 y.o.  Admit date: 02/09/2022 Discharge date: 02/11/2022  Admission Diagnoses:  Discharge Diagnoses:  Principal Problem:   Hyperkalemia Active Problems:   Anemia of chronic disease   Diabetes mellitus ([place])   HLD (hyperlipidemia)   ESRD on hemodialysis (HCC)   Nausea   History of dysphagia   Elevated blood pressure reading   Discharged Condition: stable  Hospital Course: Patient is a 41 year old African-American female with past medical history significant for end-stage renal disease on hemodialysis on Monday, Wednesday, Friday schedule, anemia of chronic kidney disease associated baseline hemoglobin range of 9-11, type 2 diabetes mellitus with most recent hemoglobin A1c 10.4% in December 2022, hyperlipidemia, 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  Patient was admitted with hyperkalemia, potassium was 7.4 and what seems to be mildly uremic syndrome.  Patient endorses nausea with diminished appetite.  Patient was admitted and underwent emergent hemodialysis.  Potassium dropped to 5.7.  Nephrology team directed patient's care.  Patient was also treated with Lokelma.  Potassium is 4.1 today.  Nephrology team is cleared patient for discharge.  Patient will be discharged back to his skilled nursing facility.  Patient will continue hemodialysis Monday, Wednesday and Friday.  Patient will continue wound care at the skilled nursing facility.  Consults: nephrology   Treatments:  Patient underwent emergent hemodialysis.  Potassium improved from 7.4 to 5.7.  Patient was also treated with Lokelma.  Potassium prior to discharge was 4.1.  Discharge Exam: Blood pressure 135/76, pulse 91, temperature 98.1 F (36.7 C), temperature source Oral, resp. rate 16, height '5\' 1"'  (1.549 m), weight 65.9 kg, SpO2 100 %.   Disposition: Discharge  disposition: 03-Skilled Nursing Facility       Discharge Instructions     Diet - low sodium heart healthy   Complete by: As directed    Discharge wound care:   Complete by: As directed    Continue current wound care plan.  Please ensure wound care follow-up at the facility.   Increase activity slowly   Complete by: As directed       Allergies as of 02/11/2022       Reactions   Morphine Rash, Other (See Comments)   Peanut-containing Drug Products Anaphylaxis, Hives   Penicillins Rash, Other (See Comments)   Rash in 2008.  Tolerated cefazolin in 2020   Chocolate Hives        Medication List     STOP taking these medications    heparin 5000 UNIT/ML injection   HYDROmorphone 1 MG/ML injection Commonly known as: DILAUDID   insulin glargine-yfgn 100 UNIT/ML injection Commonly known as: SEMGLEE   midodrine 5 MG tablet Commonly known as: PROAMATINE   ondansetron 4 MG disintegrating tablet Commonly known as: ZOFRAN-ODT       TAKE these medications    Accu-Chek FastClix Lancets Misc Use as instructed to check blood sugar up to TID. E11.22 What changed:  how much to take how to take this when to take this   Accu-Chek Guide Me w/Device Kit 1 kit by Does not apply route 3 (three) times daily. Use to check BG at home up to 3 times daily. E11.22   Accu-Chek Guide test strip Generic drug: glucose blood Use as instructed to check blood sugar up to TID. E11.22 What changed:  how much to take how to take this when to take this   atorvastatin 40 MG  tablet Commonly known as: LIPITOR Place 1 tablet (40 mg total) into feeding tube daily.   calcitRIOL 0.25 MCG capsule Commonly known as: ROCALTROL Place 0.25 mcg into feeding tube 2 (two) times daily.   Chlorhexidine Gluconate Cloth 2 % Pads Apply 6 each topically daily at 6 (six) AM. Start taking on: February 12, 2022   cinacalcet 90 MG tablet Commonly known as: SENSIPAR Place 90 mg into feeding tube daily.    Dexcom G6 Receiver Devi 1 Device by Does not apply route as directed.   Dexcom G6 Sensor Misc 1 Device by Does not apply route as directed.   Dexcom G6 Transmitter Misc 1 Device by Does not apply route as directed.   dorzolamide-timolol 22.3-6.8 MG/ML ophthalmic solution Commonly known as: COSOPT Place 1 drop into the left eye 2 (two) times daily.   Nepro Liqd Place 60 mL/hr into feeding tube See admin instructions. 60 ml's/hr, per tube- Start infusion at 1600 and stop at 0800. 16 hours total every shift for nutritional support. Administer 60 ml's/hr via G-Tube continuously with a 30 ml's flush every 2 hours for 16 hours.   feeding supplement (NEPRO CARB STEADY) Liqd Place 960 mLs into feeding tube daily.   feeding supplement (PROSource TF) liquid Place 45 mLs into feeding tube 2 (two) times daily.   nutrition supplement (JUVEN) Pack Place 1 packet into feeding tube 2 (two) times daily between meals.   free water Soln Place 30 mLs into feeding tube every 4 (four) hours.   insulin aspart 100 UNIT/ML injection Commonly known as: novoLOG Inject 0-6 Units into the skin every 4 (four) hours.   Insulin Pen Needle 32G X 4 MM Misc 1 Device by Does not apply route as directed.   multivitamin with minerals Tabs tablet Take 1 tablet by mouth every morning.   NON FORMULARY 30 mLs See admin instructions. ProHeal- 30 ml's, per tube, once a day   nystatin 100000 UNIT/ML suspension Commonly known as: MYCOSTATIN Take 5 mLs (500,000 Units total) by mouth 4 (four) times daily.   oxyCODONE-acetaminophen 5-325 MG tablet Commonly known as: Percocet Take 1 tablet by mouth every 6 (six) hours as needed for severe pain. What changed: reasons to take this   Retacrit 10000 UNIT/ML injection Generic drug: epoetin alfa-epbx 10,000 Units See admin instructions. 10,000 units IM every Mon/Wed/Fri with dialysis   Santyl 250 UNIT/GM ointment Generic drug: collagenase Apply 1 Application  topically See admin instructions. Apply to right above-the-knee amputation topically every day shift- for wound   sennosides 8.8 MG/5ML syrup Commonly known as: SENOKOT Place 5 mLs into feeding tube 2 (two) times daily.   thiamine 100 MG tablet Place 1 tablet (100 mg total) into feeding tube daily.               Discharge Care Instructions  (From admission, onward)           Start     Ordered   02/11/22 0000  Discharge wound care:       Comments: Continue current wound care plan.  Please ensure wound care follow-up at the facility.   02/11/22 1301            Contact information for after-discharge care     Destination     [facility] Preferred SNF .   Service: Skilled Chiropodist information: [address] [place] [phone]  SignedBonnell Public 02/11/2022, 1:01 PM

## 2022-02-11 NOTE — Plan of Care (Signed)
Problem: Education: Goal: Ability to describe self-care measures that may prevent or decrease complications (Diabetes Survival Skills Education) will improve Outcome: Completed/Met Goal: Individualized Educational Video(s) Outcome: Completed/Met   Problem: Coping: Goal: Ability to adjust to condition or change in health will improve Outcome: Completed/Met   Problem: Fluid Volume: Goal: Ability to maintain a balanced intake and output will improve Outcome: Completed/Met   Problem: Health Behavior/Discharge Planning: Goal: Ability to identify and utilize available resources and services will improve Outcome: Completed/Met Goal: Ability to manage health-related needs will improve Outcome: Completed/Met   Problem: Metabolic: Goal: Ability to maintain appropriate glucose levels will improve Outcome: Completed/Met   Problem: Nutritional: Goal: Maintenance of adequate nutrition will improve Outcome: Completed/Met Goal: Progress toward achieving an optimal weight will improve Outcome: Completed/Met   Problem: Skin Integrity: Goal: Risk for impaired skin integrity will decrease Outcome: Completed/Met   Problem: Tissue Perfusion: Goal: Adequacy of tissue perfusion will improve Outcome: Completed/Met   Problem: Education: Goal: Knowledge of General Education information will improve Description: Including pain rating scale, medication(s)/side effects and non-pharmacologic comfort measures Outcome: Completed/Met   Problem: Health Behavior/Discharge Planning: Goal: Ability to manage health-related needs will improve Outcome: Completed/Met   Problem: Clinical Measurements: Goal: Ability to maintain clinical measurements within normal limits will improve Outcome: Completed/Met Goal: Will remain free from infection Outcome: Completed/Met Goal: Diagnostic test results will improve Outcome: Completed/Met Goal: Respiratory complications will improve Outcome: Completed/Met Goal:  Cardiovascular complication will be avoided Outcome: Completed/Met   Problem: Activity: Goal: Risk for activity intolerance will decrease Outcome: Completed/Met   Problem: Nutrition: Goal: Adequate nutrition will be maintained Outcome: Completed/Met   Problem: Coping: Goal: Level of anxiety will decrease Outcome: Completed/Met   Problem: Elimination: Goal: Will not experience complications related to bowel motility Outcome: Completed/Met Goal: Will not experience complications related to urinary retention Outcome: Completed/Met   Problem: Pain Managment: Goal: General experience of comfort will improve Outcome: Completed/Met   Problem: Safety: Goal: Ability to remain free from injury will improve Outcome: Completed/Met   Problem: Skin Integrity: Goal: Risk for impaired skin integrity will decrease Outcome: Completed/Met   Problem: Education: Goal: Knowledge of General Education information will improve Description: Including pain rating scale, medication(s)/side effects and non-pharmacologic comfort measures Outcome: Completed/Met   Problem: Health Behavior/Discharge Planning: Goal: Ability to manage health-related needs will improve Outcome: Completed/Met   Problem: Clinical Measurements: Goal: Ability to maintain clinical measurements within normal limits will improve Outcome: Completed/Met Goal: Will remain free from infection Outcome: Completed/Met Goal: Diagnostic test results will improve Outcome: Completed/Met Goal: Respiratory complications will improve Outcome: Completed/Met Goal: Cardiovascular complication will be avoided Outcome: Completed/Met   Problem: Activity: Goal: Risk for activity intolerance will decrease Outcome: Completed/Met   Problem: Education: Goal: Ability to describe self-care measures that may prevent or decrease complications (Diabetes Survival Skills Education) will improve Outcome: Completed/Met Goal: Individualized  Educational Video(s) Outcome: Completed/Met   Problem: Coping: Goal: Ability to adjust to condition or change in health will improve Outcome: Completed/Met   Problem: Fluid Volume: Goal: Ability to maintain a balanced intake and output will improve Outcome: Completed/Met   Problem: Health Behavior/Discharge Planning: Goal: Ability to identify and utilize available resources and services will improve Outcome: Completed/Met Goal: Ability to manage health-related needs will improve Outcome: Completed/Met   Problem: Metabolic: Goal: Ability to maintain appropriate glucose levels will improve Outcome: Completed/Met   Problem: Nutritional: Goal: Maintenance of adequate nutrition will improve Outcome: Completed/Met Goal: Progress toward achieving an optimal weight will improve Outcome: Completed/Met  Problem: Skin Integrity: Goal: Risk for impaired skin integrity will decrease Outcome: Completed/Met   Problem: Tissue Perfusion: Goal: Adequacy of tissue perfusion will improve Outcome: Completed/Met

## 2022-04-02 ENCOUNTER — Emergency Department (HOSPITAL_COMMUNITY): Payer: Medicare Other

## 2022-04-02 ENCOUNTER — Other Ambulatory Visit: Payer: Self-pay

## 2022-04-02 ENCOUNTER — Encounter (HOSPITAL_COMMUNITY): Payer: Self-pay

## 2022-04-02 ENCOUNTER — Inpatient Hospital Stay (HOSPITAL_COMMUNITY): Payer: Medicare Other

## 2022-04-02 ENCOUNTER — Inpatient Hospital Stay (HOSPITAL_COMMUNITY)
Admission: EM | Admit: 2022-04-02 | Discharge: 2022-04-07 | DRG: 602 | Disposition: A | Discharge status: Skilled Nursing Facility | Payer: Medicare Other | Attending: Internal Medicine | Admitting: Internal Medicine

## 2022-04-02 DIAGNOSIS — Z89611 Acquired absence of right leg above knee: Secondary | ICD-10-CM

## 2022-04-02 DIAGNOSIS — L89620 Pressure ulcer of left heel, unstageable: Secondary | ICD-10-CM | POA: Diagnosis present

## 2022-04-02 DIAGNOSIS — E1122 Type 2 diabetes mellitus with diabetic chronic kidney disease: Secondary | ICD-10-CM | POA: Diagnosis present

## 2022-04-02 DIAGNOSIS — Z8673 Personal history of transient ischemic attack (TIA), and cerebral infarction without residual deficits: Secondary | ICD-10-CM | POA: Diagnosis not present

## 2022-04-02 DIAGNOSIS — I82C21 Chronic embolism and thrombosis of right internal jugular vein: Secondary | ICD-10-CM | POA: Diagnosis present

## 2022-04-02 DIAGNOSIS — Z91010 Allergy to peanuts: Secondary | ICD-10-CM

## 2022-04-02 DIAGNOSIS — D62 Acute posthemorrhagic anemia: Secondary | ICD-10-CM | POA: Diagnosis present

## 2022-04-02 DIAGNOSIS — Z794 Long term (current) use of insulin: Secondary | ICD-10-CM | POA: Diagnosis not present

## 2022-04-02 DIAGNOSIS — Z7984 Long term (current) use of oral hypoglycemic drugs: Secondary | ICD-10-CM

## 2022-04-02 DIAGNOSIS — Z931 Gastrostomy status: Secondary | ICD-10-CM

## 2022-04-02 DIAGNOSIS — Z880 Allergy status to penicillin: Secondary | ICD-10-CM

## 2022-04-02 DIAGNOSIS — Q2112 Patent foramen ovale: Secondary | ICD-10-CM | POA: Diagnosis not present

## 2022-04-02 DIAGNOSIS — E1151 Type 2 diabetes mellitus with diabetic peripheral angiopathy without gangrene: Secondary | ICD-10-CM | POA: Diagnosis present

## 2022-04-02 DIAGNOSIS — Z79899 Other long term (current) drug therapy: Secondary | ICD-10-CM | POA: Diagnosis not present

## 2022-04-02 DIAGNOSIS — E11649 Type 2 diabetes mellitus with hypoglycemia without coma: Secondary | ICD-10-CM | POA: Diagnosis present

## 2022-04-02 DIAGNOSIS — D631 Anemia in chronic kidney disease: Secondary | ICD-10-CM | POA: Diagnosis present

## 2022-04-02 DIAGNOSIS — E11621 Type 2 diabetes mellitus with foot ulcer: Secondary | ICD-10-CM | POA: Diagnosis present

## 2022-04-02 DIAGNOSIS — R609 Edema, unspecified: Secondary | ICD-10-CM | POA: Diagnosis not present

## 2022-04-02 DIAGNOSIS — R030 Elevated blood-pressure reading, without diagnosis of hypertension: Secondary | ICD-10-CM | POA: Diagnosis present

## 2022-04-02 DIAGNOSIS — H5461 Unqualified visual loss, right eye, normal vision left eye: Secondary | ICD-10-CM | POA: Diagnosis present

## 2022-04-02 DIAGNOSIS — L03116 Cellulitis of left lower limb: Principal | ICD-10-CM | POA: Diagnosis present

## 2022-04-02 DIAGNOSIS — I739 Peripheral vascular disease, unspecified: Secondary | ICD-10-CM

## 2022-04-02 DIAGNOSIS — M898X9 Other specified disorders of bone, unspecified site: Secondary | ICD-10-CM | POA: Diagnosis present

## 2022-04-02 DIAGNOSIS — E43 Unspecified severe protein-calorie malnutrition: Secondary | ICD-10-CM | POA: Diagnosis present

## 2022-04-02 DIAGNOSIS — L0390 Cellulitis, unspecified: Secondary | ICD-10-CM | POA: Diagnosis present

## 2022-04-02 DIAGNOSIS — E162 Hypoglycemia, unspecified: Secondary | ICD-10-CM | POA: Diagnosis not present

## 2022-04-02 DIAGNOSIS — L89893 Pressure ulcer of other site, stage 3: Secondary | ICD-10-CM | POA: Diagnosis present

## 2022-04-02 DIAGNOSIS — L089 Local infection of the skin and subcutaneous tissue, unspecified: Principal | ICD-10-CM

## 2022-04-02 DIAGNOSIS — N179 Acute kidney failure, unspecified: Secondary | ICD-10-CM | POA: Diagnosis present

## 2022-04-02 DIAGNOSIS — Z992 Dependence on renal dialysis: Secondary | ICD-10-CM

## 2022-04-02 DIAGNOSIS — L97529 Non-pressure chronic ulcer of other part of left foot with unspecified severity: Secondary | ICD-10-CM | POA: Diagnosis present

## 2022-04-02 DIAGNOSIS — Z885 Allergy status to narcotic agent status: Secondary | ICD-10-CM

## 2022-04-02 DIAGNOSIS — N186 End stage renal disease: Secondary | ICD-10-CM

## 2022-04-02 DIAGNOSIS — F419 Anxiety disorder, unspecified: Secondary | ICD-10-CM | POA: Diagnosis not present

## 2022-04-02 DIAGNOSIS — Z91018 Allergy to other foods: Secondary | ICD-10-CM

## 2022-04-02 DIAGNOSIS — B964 Proteus (mirabilis) (morganii) as the cause of diseases classified elsewhere: Secondary | ICD-10-CM | POA: Diagnosis present

## 2022-04-02 DIAGNOSIS — Z6824 Body mass index (BMI) 24.0-24.9, adult: Secondary | ICD-10-CM

## 2022-04-02 DIAGNOSIS — Z833 Family history of diabetes mellitus: Secondary | ICD-10-CM

## 2022-04-02 HISTORY — DX: End stage renal disease: Z99.2

## 2022-04-02 HISTORY — DX: Dependence on renal dialysis: N18.6

## 2022-04-02 LAB — CBC WITH DIFFERENTIAL/PLATELET
Abs Immature Granulocytes: 0.13 10*3/uL — ABNORMAL HIGH (ref 0.00–0.07)
Basophils Absolute: 0.1 10*3/uL (ref 0.0–0.1)
Basophils Relative: 0 %
Eosinophils Absolute: 0.2 10*3/uL (ref 0.0–0.5)
Eosinophils Relative: 1 %
HCT: 26.1 % — ABNORMAL LOW (ref 36.0–46.0)
Hemoglobin: 7.7 g/dL — ABNORMAL LOW (ref 12.0–15.0)
Immature Granulocytes: 1 %
Lymphocytes Relative: 6 %
Lymphs Abs: 1.1 10*3/uL (ref 0.7–4.0)
MCH: 22.6 pg — ABNORMAL LOW (ref 26.0–34.0)
MCHC: 29.5 g/dL — ABNORMAL LOW (ref 30.0–36.0)
MCV: 76.5 fL — ABNORMAL LOW (ref 80.0–100.0)
Monocytes Absolute: 0.7 10*3/uL (ref 0.1–1.0)
Monocytes Relative: 4 %
Neutro Abs: 16.2 10*3/uL — ABNORMAL HIGH (ref 1.7–7.7)
Neutrophils Relative %: 88 %
Platelets: 521 10*3/uL — ABNORMAL HIGH (ref 150–400)
RBC: 3.41 MIL/uL — ABNORMAL LOW (ref 3.87–5.11)
RDW: 18.6 % — ABNORMAL HIGH (ref 11.5–15.5)
WBC: 18.4 10*3/uL — ABNORMAL HIGH (ref 4.0–10.5)
nRBC: 0 % (ref 0.0–0.2)

## 2022-04-02 LAB — COMPREHENSIVE METABOLIC PANEL
ALT: 9 U/L (ref 0–44)
AST: 14 U/L — ABNORMAL LOW (ref 15–41)
Albumin: 1.9 g/dL — ABNORMAL LOW (ref 3.5–5.0)
Alkaline Phosphatase: 111 U/L (ref 38–126)
Anion gap: 19 — ABNORMAL HIGH (ref 5–15)
BUN: 66 mg/dL — ABNORMAL HIGH (ref 6–20)
CO2: 23 mmol/L (ref 22–32)
Calcium: 7.8 mg/dL — ABNORMAL LOW (ref 8.9–10.3)
Chloride: 94 mmol/L — ABNORMAL LOW (ref 98–111)
Creatinine, Ser: 10.25 mg/dL — ABNORMAL HIGH (ref 0.44–1.00)
GFR, Estimated: 4 mL/min — ABNORMAL LOW (ref >60)
Glucose, Bld: 39 mg/dL — CL (ref 70–99)
Potassium: 4.9 mmol/L (ref 3.5–5.1)
Sodium: 136 mmol/L (ref 135–145)
Total Bilirubin: 0.7 mg/dL (ref 0.3–1.2)
Total Protein: 7.8 g/dL (ref 6.5–8.1)

## 2022-04-02 LAB — CBG MONITORING, ED
Glucose-Capillary: 37 mg/dL — CL (ref 70–99)
Glucose-Capillary: 37 mg/dL — CL (ref 70–99)
Glucose-Capillary: 88 mg/dL (ref 70–99)
Glucose-Capillary: 103 mg/dL — ABNORMAL HIGH (ref 70–99)
Glucose-Capillary: 97 mg/dL (ref 70–99)
Glucose-Capillary: 107 mg/dL — ABNORMAL HIGH (ref 70–99)

## 2022-04-02 MED ORDER — HEPARIN SODIUM (PORCINE) 5000 UNIT/ML IJ SOLN
5000.0000 [IU] | Freq: Three times a day (TID) | INTRAMUSCULAR | Status: DC
  Administered 2022-04-02: 5000 [IU] via SUBCUTANEOUS
  Filled 2022-04-02: qty 1

## 2022-04-02 MED ORDER — VANCOMYCIN HCL 1500 MG/300ML IV SOLN
1500.0000 mg | Freq: Once | INTRAVENOUS | Status: AC
  Administered 2022-04-02: 1500 mg via INTRAVENOUS
  Filled 2022-04-02: qty 300

## 2022-04-02 MED ORDER — CLINDAMYCIN HCL 150 MG PO CAPS: 450.0000 mg | ORAL_CAPSULE | Freq: Three times a day (TID) | ORAL | 0 refills | Status: DC

## 2022-04-02 MED ORDER — DEXTROSE 50 % IV SOLN
1.0000 | Freq: Once | INTRAVENOUS | Status: AC
  Administered 2022-04-02: 50 mL via INTRAVENOUS
  Filled 2022-04-02: qty 50

## 2022-04-02 MED ORDER — SODIUM CHLORIDE 0.9 % IV SOLN: 250.0000 mL | INTRAVENOUS | Status: DC | PRN

## 2022-04-02 MED ORDER — DORZOLAMIDE HCL-TIMOLOL MAL 2-0.5 % OP SOLN
1.0000 [drp] | Freq: Two times a day (BID) | OPHTHALMIC | Status: DC
Start: 2022-04-02 — End: 2022-04-07
  Administered 2022-04-03 – 2022-04-06 (×5): 1 [drp] via OPHTHALMIC
  Filled 2022-04-02: qty 10

## 2022-04-02 MED ORDER — FENTANYL CITRATE PF 50 MCG/ML IJ SOSY
50.0000 ug | PREFILLED_SYRINGE | Freq: Once | INTRAMUSCULAR | Status: AC
  Administered 2022-04-02: 50 ug via INTRAVENOUS
  Filled 2022-04-02: qty 1

## 2022-04-02 MED ORDER — SODIUM CHLORIDE 0.9% FLUSH: 3.0000 mL | INTRAVENOUS | Status: DC | PRN

## 2022-04-02 MED ORDER — SODIUM CHLORIDE 0.9% FLUSH
3.0000 mL | Freq: Two times a day (BID) | INTRAVENOUS | Status: DC
  Administered 2022-04-02 – 2022-04-06 (×7): 3 mL via INTRAVENOUS

## 2022-04-02 MED ORDER — VANCOMYCIN HCL IN DEXTROSE 1-5 GM/200ML-% IV SOLN: 1000.0000 mg | Freq: Once | INTRAVENOUS | Status: DC

## 2022-04-02 MED ORDER — SODIUM CHLORIDE 0.9 % IV SOLN
1.0000 g | INTRAVENOUS | Status: DC
  Administered 2022-04-02 – 2022-04-04 (×3): 1 g via INTRAVENOUS
  Filled 2022-04-02 (×5): qty 10

## 2022-04-02 MED ORDER — ATORVASTATIN CALCIUM 40 MG PO TABS
40.0000 mg | ORAL_TABLET | Freq: Every day | ORAL | Status: DC
Start: 2022-04-02 — End: 2022-04-07
  Administered 2022-04-02 – 2022-04-05 (×4): 40 mg
  Filled 2022-04-02 (×4): qty 1

## 2022-04-02 NOTE — ED Triage Notes (Signed)
Pt BIBA from [place] for reports of maggots in L foot stage 3 ulcer. Pt A&O x4. R AKA

## 2022-04-02 NOTE — ED Notes (Signed)
RN removed about 6 sheets off of patient due to body temp being a 100.55F, patient upset and stating "I'm cold". RN explained to patient about body temp being too high and needing to remove the warm blankets to prevent fever growing.

## 2022-04-02 NOTE — Progress Notes (Addendum)
Pharmacy Antibiotic Note  [name] is a 41 y.o. female admitted on 04/02/2022 with wound infection.  Pharmacy has been consulted for vancomycin dosing.  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 Patient from [place] with reports of maggots in foot wound.  Plan: Vancomycin 1500mg  x1 loading dose - subsequent dosing per HD schedule F/u clinical course, cultures, HD plan  ADDENDUM: Cefepime 1g q24h per pharmacy consult added     Temp (24hrs), Avg:98.7 F (37.1 C), Min:98.7 F (37.1 C), Max:98.7 F (37.1 C)  No results for input(s): "WBC", "CREATININE", "LATICACIDVEN", "VANCOTROUGH", "VANCOPEAK", "VANCORANDOM", "GENTTROUGH", "GENTPEAK", "GENTRANDOM", "TOBRATROUGH", "TOBRAPEAK", "TOBRARND", "AMIKACINPEAK", "AMIKACINTROU", "AMIKACIN" in the last 168 hours.  CrCl cannot be calculated (Patient's most recent lab result is older than the maximum 21 days allowed.).    Allergies  Allergen Reactions   Morphine Rash and Other (See Comments)   Peanut-Containing Drug Products Anaphylaxis and Hives   Penicillins Rash and Other (See Comments)    Rash in 2008.  Tolerated cefazolin in 2020   Chocolate Hives    Antimicrobials this admission: Vancomycin 8/21 >>   Microbiology results: 8/21 wound:   Thank you for allowing pharmacy to be a part of this patient's care.  Lorelei Pont, PharmD, BCPS 04/02/2022 10:01 PM ED Clinical Pharmacist -  [phone]

## 2022-04-02 NOTE — ED Provider Notes (Signed)
Care transferred from Tracey Blaze, PA-C at time of sign out. See their note for full assessment.   Briefly: Patient is 41 y.o. female with history of ESRD on dialysis (Monday, Wednesday, Friday), who presents to the ED with concerns for left heel infection today.  From [facility], who was sent to the emergency department due to concerns for maggot in the wound..     Plan: Plan per previous PA-C: pending labs, if no acute abnormalities, then likely discharge home with Rx clindamycin.  Physical Exam Vitals and nursing note reviewed.  Constitutional:      Appearance: Normal appearance.  HENT:     Head: Normocephalic and atraumatic.     Nose: Nose normal.  Eyes:     Extraocular Movements: Extraocular movements intact.  Cardiovascular:     Rate and Rhythm: Normal rate and regular rhythm.     Pulses: Normal pulses.     Heart sounds: Normal heart sounds. No murmur heard.    No friction rub. No gallop.  Pulmonary:     Effort: Pulmonary effort is normal. No respiratory distress.     Breath sounds: Normal breath sounds.  Abdominal:     General: There is no distension.  Musculoskeletal:     Comments: Malodorous wound noted to heel of left foot with purulent drainage noted on exam.  Tenderness to palpation noted to the area.  Swelling noted to area.  No obvious erythema noted to foot.  Pedal pulse intact.  See pictures below  Neurological:     Mental Status: She is alert.       Clinical Course as of 04/02/22 1942  Mon Apr 02, 2022  1126 Called [facility] and was transferred to staff. Disconnected/they hung up.  Recalled back, Spoke with Maudie Mercury who stated the treatment nurse would call me back. I gave her my work Paediatric nurse.  [LA]  1538 IV team obtaining IV access at this time.  [SB]  1749 Discussed with patient treatment plan. Pt agreeable at this time.  [SB]  [place] Notified by RNs that IV team was unable to obtain lactic acid. Pt has had several IV team members attempt IV access.  Discussed with RNs to proceed with Vancomycin at this time. [SB]  1642 Pt re-evaluated and noted improvement of symptoms with treatment regimen in the ED. Patient spouse at bedside who was informed that we are awaiting lab results at this time.  [SB]  1810 Notified that CBG was at 38 and RN gave the patient 2 cups of orange juice along with sugar in it. [SB]  1844 Blood pressure at 118/25, last went to dialysis on Friday. Discussed with patient lab and imaging findings as well as plans for admission. Pt agreeable at this time.  [SB]  1914 Consult with nephrologist, Dr. Royce Macadamia who will have the patient evaluated in the morning. [SB]  1932 Consult with hospitalist, Dr. Hal Hope who will evaluate the patient for admission.  [SB]    Clinical Course User Index [LA] Mickie Hillier, PA-C [SB] Giannina Bartolome A, PA-C    Results for orders placed or performed during the hospital encounter of 04/02/22  Comprehensive metabolic panel  Result Value Ref Range   Sodium 136 135 - 145 mmol/L   Potassium 4.9 3.5 - 5.1 mmol/L   Chloride 94 (L) 98 - 111 mmol/L   CO2 23 22 - 32 mmol/L   Glucose, Bld 39 (LL) 70 - 99 mg/dL   BUN 66 (H) 6 - 20 mg/dL  Creatinine, Ser 10.25 (H) 0.44 - 1.00 mg/dL   Calcium 7.8 (L) 8.9 - 10.3 mg/dL   Total Protein 7.8 6.5 - 8.1 g/dL   Albumin 1.9 (L) 3.5 - 5.0 g/dL   AST 14 (L) 15 - 41 U/L   ALT 9 0 - 44 U/L   Alkaline Phosphatase 111 38 - 126 U/L   Total Bilirubin 0.7 0.3 - 1.2 mg/dL   GFR, Estimated 4 (L) >60 mL/min   Anion gap 19 (H) 5 - 15  CBC with Differential  Result Value Ref Range   WBC 18.4 (H) 4.0 - 10.5 K/uL   RBC 3.41 (L) 3.87 - 5.11 MIL/uL   Hemoglobin 7.7 (L) 12.0 - 15.0 g/dL   HCT 26.1 (L) 36.0 - 46.0 %   MCV 76.5 (L) 80.0 - 100.0 fL   MCH 22.6 (L) 26.0 - 34.0 pg   MCHC 29.5 (L) 30.0 - 36.0 g/dL   RDW 18.6 (H) 11.5 - 15.5 %   Platelets 521 (H) 150 - 400 K/uL   nRBC 0.0 0.0 - 0.2 %   Neutrophils Relative % 88 %   Neutro Abs 16.2 (H) 1.7 - 7.7 K/uL    Lymphocytes Relative 6 %   Lymphs Abs 1.1 0.7 - 4.0 K/uL   Monocytes Relative 4 %   Monocytes Absolute 0.7 0.1 - 1.0 K/uL   Eosinophils Relative 1 %   Eosinophils Absolute 0.2 0.0 - 0.5 K/uL   Basophils Relative 0 %   Basophils Absolute 0.1 0.0 - 0.1 K/uL   Immature Granulocytes 1 %   Abs Immature Granulocytes 0.13 (H) 0.00 - 0.07 K/uL  CBG monitoring, ED  Result Value Ref Range   Glucose-Capillary 37 (LL) 70 - 99 mg/dL   Comment 1 Notify RN   CBG monitoring, ED (now and then every hour for 3 hours)  Result Value Ref Range   Glucose-Capillary 37 (LL) 70 - 99 mg/dL   Comment 1 Notify RN    Comment 2 Document in Chart   CBG monitoring, ED (now and then every hour for 3 hours)  Result Value Ref Range   Glucose-Capillary 88 70 - 99 mg/dL   DG Foot Complete Left  Result Date: 04/02/2022 CLINICAL DATA:  Wound/infection of the left foot. Calcaneal osteomyelitis? EXAM: LEFT FOOT - COMPLETE 3+ VIEW COMPARISON:  None Available. FINDINGS: Postsurgical changes of prior surgical resection through the neck of the second metatarsal. Hallux valgus deformity with degenerative changes at the first metatarsophalangeal joint. No cortical erosion or periosteal reaction diffuse osteopenia however limits evaluation. Prominent vascular calcifications. Skin irregularity about the calcaneus suggesting skin ulcer. IMPRESSION: 1. Postsurgical changes from prior surgical resection to the second metatarsal neck. 2. No cortical erosion or periosteal reaction to suggest osteomyelitis, diffuse osteopenia however limits evaluation. If there is clinical concern for osteomyelitis, further evaluation with MR examination would be helpful. Electronically Signed   By: Keane Police D.O.   On: 04/02/2022 11:05     Doubt osteomyelitis at this time, no radiographic findings at this time.  Patient remained afebrile while in the emergency department prior to admission.  Concern for acute infection of left foot in the setting of  diabetes.  Patient also missed her dialysis treatment today due to being in the emergency department.  Consultation with nephrologist who will evaluate patient in the morning and arrange for hemodialysis as needed.  Also concern for hypoglycemia, hypoglycemia improved with juice and D5 in the emergency department. Discussed with patient plans for admission.  Patient agreeable at this time.  Patient appears safe for admission at this time.    This chart was dictated using voice recognition software, Dragon. Despite the best efforts of this provider to proofread and correct errors, errors may still occur which can change documentation meaning.   Collyn Ribas A, PA-C 04/02/22 2028    Dorie Rank, MD 04/03/22 1102

## 2022-04-02 NOTE — H&P (Incomplete)
History and Physical    [name] MRN:[id number] DOB: 09/19/1980 DOA: 04/02/2022  PCP: [facility]  Patient coming from: Skilled nursing facility.  Chief Complaint: Left foot wound concerning following infection.  HPI: [name] is a 41 y.o. female with history of ESRD on Monday Wednesday Friday, recent admission in March of this year for stroke with PFO and GBS Miller Fisher variant was referred to the ER after patient's left foot was noticed an ulcer which had maggots and signs of infection.  Patient is a poor historian to give more further details.  ED Course: Examined the ER there was some bloody discharge from the wound.  The bloody discharge has stopped without any intervention.  X-rays do not show any signs of bony involvement.  Pulses on the left foot is dopplerable.  The right foot stump does not look infected.  Patient while in the ER became febrile.  Patient also has significant drop in hemoglobin from 11.7 about a month ago it is around 7.7.  Review of Systems: As per HPI, rest all negative.   Past Medical History:  Diagnosis Date   Anemia    Anxiety    Blind right eye 2008   Diabetes mellitus without complication (HCC)    Dialysis patient (HCC)    Embolic stroke (HCC)    ESRD (end stage renal disease) (HCC)    HD on M/W/F   GBS (Guillain Barre syndrome) (HCC)     Past Surgical History:  Procedure Laterality Date   AMPUTATION Right 08/11/2021   Procedure: TRANSMETATARSAL AMPUTATION OF RIGHT FOOT;  Surgeon: Brabham, Vance W, MD;  Location: [facility] OR;  Service: Vascular;  Laterality: Right;   AMPUTATION Right 09/17/2021   Procedure: AMPUTATION RIGHT BELOW KNEE;  Surgeon: Brabham, Vance W, MD;  Location: [facility] OR;  Service: Vascular;  Laterality: Right;   AMPUTATION Right 10/11/2021   Procedure: RIGHT ABOVE KNEE AMPUTATION;  Surgeon: Brabham, Vance W, MD;  Location: [facility] OR;  Service: Vascular;  Laterality: Right;   AMPUTATION TOE Left     APPLICATION OF WOUND VAC  08/16/2021   Procedure: APPLICATION OF WOUND VAC;  Surgeon: Brabham, Vance W, MD;  Location: [facility] OR;  Service: Vascular;;   AV FISTULA PLACEMENT     BUBBLE STUDY  10/02/2021   Procedure: BUBBLE STUDY;  Surgeon: Ross, Paula V, MD;  Location: [facility]OS[facility];  Service: Cardiovascular;;   CESAREAN SECTION  2011   FISTULA SUPERFICIALIZATION Right 07/27/2019   Procedure: 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;  Surgeon: Dickson, Christopher S, MD;  Location: [facility];  Service: Vascular;  Laterality: Right;   INSERTION OF DIALYSIS CATHETER Left 07/27/2019   Procedure: INSERTION OF TUNNELED  DIALYSIS CATHETER;  Surgeon: Dickson, Christopher S, MD;  Location: [facility];  Service: Vascular;  Laterality: Left;   INSERTION OF DIALYSIS CATHETER Left 10/23/2021   Procedure: INSERTION OF TUNNELED PALINDROME PRECISION DIALYSIS CATHETER (23cm);  Surgeon: Clark, Christopher J, MD;  Location: [facility];  Service: Vascular;  Laterality: Left;   IR FLUORO GUIDE CV LINE LEFT  10/16/2021   IR FLUORO GUIDE CV LINE RIGHT  10/11/2021   IR GASTROSTOMY TUBE MOD SED  10/30/2021   IR US GUIDE VASC ACCESS LEFT  10/16/2021   IR US GUIDE VASC ACCESS RIGHT  10/11/2021   LOWER EXTREMITY ANGIOGRAPHY N/A 08/11/2021   Procedure: LOWER EXTREMITY ANGIOGRAPHY;  Surgeon: Brabham, Vance W, MD;  Location: [facility];  Service: Cardiovascular;  Laterality: N/A;     REVISON OF ARTERIOVENOUS FISTULA Right 10/23/2021   Procedure: REVISON OF ARTERIOVENOUS FISTULA  ARM AND PLICATION;  Surgeon: Marty Heck, MD;  Location: [place];  Service: Vascular;  Laterality: Right;   TEE WITHOUT CARDIOVERSION N/A 10/02/2021   Procedure: TRANSESOPHAGEAL ECHOCARDIOGRAM (TEE);  Surgeon: Fay Records, MD;  Location: [facility] END[facility]COPY;  Service: Cardiovascular;  Laterality: N/A;   TRANSMETATARSAL AMPUTATION Right 08/16/2021   Procedure: CLOSURE OF RIGHT TRANSMETATARSAL AMPUTATION;  Surgeon: Serafina Mitchell, MD;  Location: [place];   Service: Vascular;  Laterality: Right;     reports that she has never smoked. She has never used smokeless tobacco. She reports that she does not drink alcohol and does not use drugs.  Allergies  Allergen Reactions   Morphine Rash and Other (See Comments)   Peanut-Containing Drug Products Anaphylaxis and Hives   Penicillins Rash and Other (See Comments)    Rash in 2008.  Tolerated cefazolin in 2020   Chocolate Hives    Family History  Problem Relation Age of Onset   Diabetes Mother    Diabetes Father     Prior to Admission medications   Medication Sig Start Date End Date Taking? Authorizing Provider  clindamycin (CLEOCIN) 150 MG capsule Take 3 capsules (450 mg total) by mouth 3 (three) times daily for 14 days. 04/02/22 04/16/22 Yes Mickie Hillier, PA-C  Accu-Chek FastClix Lancets MISC Use as instructed to check blood sugar up to TID. E11.22 Patient taking differently: 1 each by Other route See admin instructions. Use as instructed to check blood sugar up to TID. E11.22 04/15/19   Charlott Rakes, MD  atorvastatin (LIPITOR) 40 MG tablet Place 1 tablet (40 mg total) into feeding tube daily. 10/26/21   Shelly Coss, MD  Blood Glucose Monitoring Suppl (ACCU-CHEK GUIDE ME) w/Device KIT 1 kit by Does not apply route 3 (three) times daily. Use to check BG at home up to 3 times daily. E11.22 04/15/19   Charlott Rakes, MD  calcitRIOL (ROCALTROL) 0.25 MCG capsule Place 0.25 mcg into feeding tube 2 (two) times daily. Patient not taking: Reported on 02/09/2022    [provider]  cinacalcet (SENSIPAR) 90 MG tablet Place 90 mg into feeding tube daily. 09/22/19   [provider]  Continuous Blood Gluc Receiver (DEXCOM G6 RECEIVER) DEVI 1 Device by Does not apply route as directed. 09/12/20   Shamleffer, Melanie Crazier, MD  Continuous Blood Gluc Sensor (DEXCOM G6 SENSOR) MISC 1 Device by Does not apply route as directed. Patient not taking: Reported on 02/09/2022 09/12/20   Shamleffer,  Melanie Crazier, MD  Continuous Blood Gluc Transmit (DEXCOM G6 TRANSMITTER) MISC 1 Device by Does not apply route as directed. 09/12/20   Shamleffer, Melanie Crazier, MD  dorzolamide-timolol (COSOPT) 22.3-6.8 MG/ML ophthalmic solution Place 1 drop into the left eye 2 (two) times daily. 03/30/21   [provider]  epoetin alfa-epbx (RETACRIT) 13086 UNIT/ML injection 10,000 Units See admin instructions. 10,000 units IM every Mon/Wed/Fri with dialysis    [provider]  glucose blood (ACCU-CHEK GUIDE) test strip Use as instructed to check blood sugar up to TID. E11.22 Patient taking differently: 1 each by Other route See admin instructions. Use as instructed to check blood sugar up to TID. E11.22 07/17/19   Fulp, Cammie, MD  insulin aspart (NOVOLOG) 100 UNIT/ML injection Inject 0-6 Units into the skin every 4 (four) hours. Patient not taking: Reported on 02/09/2022 10/25/21   Shelly Coss, MD  Insulin Pen Needle 32G X 4 MM MISC  1 Device by Does not apply route as directed. 05/13/20   Shamleffer, Ibtehal Jaralla, MD  Multiple Vitamin (MULTIVITAMIN WITH MINERALS) TABS tablet Take 1 tablet by mouth every morning.    [provider]  NON FORMULARY 30 mLs See admin instructions. ProHeal- 30 ml's, per tube, once a day    [provider]  nutrition supplement, JUVEN, (JUVEN) PACK Place 1 packet into feeding tube 2 (two) times daily between meals. Patient not taking: Reported on 02/09/2022 10/25/21   Adhikari, Amrit, MD  Nutritional Supplements (FEEDING SUPPLEMENT, NEPRO CARB STEADY,) LIQD Place 960 mLs into feeding tube daily. Patient not taking: Reported on 02/09/2022 10/25/21   Adhikari, Amrit, MD  Nutritional Supplements (FEEDING SUPPLEMENT, PROSOURCE TF,) liquid Place 45 mLs into feeding tube 2 (two) times daily. Patient not taking: Reported on 02/09/2022 10/25/21   Adhikari, Amrit, MD  Nutritional Supplements (NEPRO) LIQD Place 60 mL/hr into feeding tube See admin  instructions. 60 ml's/hr, per tube- Start infusion at 1600 and stop at 0800. 16 hours total every shift for nutritional support. Administer 60 ml's/hr via G-Tube continuously with a 30 ml's flush every 2 hours for 16 hours.    [provider]  nystatin (MYCOSTATIN) 100000 UNIT/ML suspension Take 5 mLs (500,000 Units total) by mouth 4 (four) times daily. 10/25/21   Adhikari, Amrit, MD  oxyCODONE-acetaminophen (PERCOCET) 5-325 MG tablet Take 1 tablet by mouth every 6 (six) hours as needed for severe pain. Patient taking differently: Take 1 tablet by mouth every 6 (six) hours as needed (for pain). 09/04/21   Collins, Emma M, PA-C  SANTYL 250 UNIT/GM ointment Apply 1 Application topically See admin instructions. Apply to right above-the-knee amputation topically every day shift- for wound    [provider]  sennosides (SENOKOT) 8.8 MG/5ML syrup Place 5 mLs into feeding tube 2 (two) times daily. 10/25/21   Adhikari, Amrit, MD  thiamine 100 MG tablet Place 1 tablet (100 mg total) into feeding tube daily. Patient not taking: Reported on 02/09/2022 10/26/21   Adhikari, Amrit, MD  Water For Irrigation, Sterile (FREE WATER) SOLN Place 30 mLs into feeding tube every 4 (four) hours. Patient not taking: Reported on 11/23/2021 10/25/21   Adhikari, Amrit, MD    Physical Exam: Constitutional: Moderately built and nourished. Vitals:   04/02/22 1214 04/02/22 1500 04/02/22 1800 04/02/22 1941  BP:  (!) 173/76 (!) 192/39   Pulse:  99 96   Resp:  16 16   Temp: 98.7 F (37.1 C)   100 F (37.8 C)  TempSrc: Oral   Oral  SpO2:  96% 98%    Eyes: Anicteric no pallor. ENMT: No discharge from the ears eyes nose and mouth. Neck: No mass felt.  No neck rigidity. Respiratory: No rhonchi or crepitations. Cardiovascular: S1 S2 heard. Abdomen: Soft nontender bowel sound present.  Tube in place. Musculoskeletal: Edema of the right upper extremity.  Left foot has an ulcer on the heel measuring around 3 cm was  having some active bleed which is stopped without intervention.  Dorsalis pedis artery felt. Skin: Ulcer on the left foot heel measuring about 3 cm. Neurologic: Alert awake oriented name and place as ptosis of both eyes.  Moving all extremities. Psychiatric: Oriented name and place.   Labs on Admission: I have personally reviewed following labs and imaging studies  CBC: Recent Labs  Lab 04/02/22 1540  WBC 18.4*  NEUTROABS 16.2*  HGB 7.7*  HCT 26.1*  MCV 76.5*  PLT 521*   Basic Metabolic   Panel: Recent Labs  Lab 04/02/22 1540  NA 136  K 4.9  CL 94*  CO2 23  GLUCOSE 39*  BUN 66*  CREATININE 10.25*  CALCIUM 7.8*   GFR: CrCl cannot be calculated (Unknown ideal weight.). Liver Function Tests: Recent Labs  Lab 04/02/22 1540  AST 14*  ALT 9  ALKPHOS 111  BILITOT 0.7  PROT 7.8  ALBUMIN 1.9*   No results for input(s): "LIPASE", "AMYLASE" in the last 168 hours. No results for input(s): "AMMONIA" in the last 168 hours. Coagulation Profile: No results for input(s): "INR", "PROTIME" in the last 168 hours. Cardiac Enzymes: No results for input(s): "CKTOTAL", "CKMB", "CKMBINDEX", "TROPONINI" in the last 168 hours. BNP (last 3 results) No results for input(s): "PROBNP" in the last 8760 hours. HbA1C: No results for input(s): "HGBA1C" in the last 72 hours. CBG: Recent Labs  Lab 04/02/22 1805 04/02/22 1821 04/02/22 1906 04/02/22 2027 04/02/22 2124  GLUCAP 37* 37* 88 103* 97   Lipid Profile: No results for input(s): "CHOL", "HDL", "LDLCALC", "TRIG", "CHOLHDL", "LDLDIRECT" in the last 72 hours. Thyroid Function Tests: No results for input(s): "TSH", "T4TOTAL", "FREET4", "T3FREE", "THYROIDAB" in the last 72 hours. Anemia Panel: No results for input(s): "VITAMINB12", "FOLATE", "FERRITIN", "TIBC", "IRON", "RETICCTPCT" in the last 72 hours. Urine analysis:    Component Value Date/Time   COLORURINE YELLOW 02/21/2019 1955   APPEARANCEUR TURBID (A) 02/21/2019 1955    LABSPEC 1.012 02/21/2019 1955   PHURINE 7.0 02/21/2019 1955   GLUCOSEU >=500 (A) 02/21/2019 1955   HGBUR MODERATE (A) 02/21/2019 1955   BILIRUBINUR NEGATIVE 02/21/2019 1955   KETONESUR NEGATIVE 02/21/2019 1955   PROTEINUR 100 (A) 02/21/2019 1955   NITRITE NEGATIVE 02/21/2019 1955   LEUKOCYTESUR MODERATE (A) 02/21/2019 1955   Sepsis Labs: @LABRCNTIP(procalcitonin:4,lacticidven:4) )No results found for this or any previous visit (from the past 240 hour(s)).   Radiological Exams on Admission: DG Foot Complete Left  Result Date: 04/02/2022 CLINICAL DATA:  Wound/infection of the left foot. Calcaneal osteomyelitis? EXAM: LEFT FOOT - COMPLETE 3+ VIEW COMPARISON:  None Available. FINDINGS: Postsurgical changes of prior surgical resection through the neck of the second metatarsal. Hallux valgus deformity with degenerative changes at the first metatarsophalangeal joint. No cortical erosion or periosteal reaction diffuse osteopenia however limits evaluation. Prominent vascular calcifications. Skin irregularity about the calcaneus suggesting skin ulcer. IMPRESSION: 1. Postsurgical changes from prior surgical resection to the second metatarsal neck. 2. No cortical erosion or periosteal reaction to suggest osteomyelitis, diffuse osteopenia however limits evaluation. If there is clinical concern for osteomyelitis, further evaluation with MR examination would be helpful. Electronically Signed   By: Imran  Ahmed D.O.   On: 04/02/2022 11:05    EKG: Independently reviewed.  Normal sinus rhythm.  Assessment/Plan Principal Problem:   Cellulitis of left foot Active Problems:   ESRD on hemodialysis (HCC)   PFO (patent foramen ovale)   Cellulitis   Hypoglycemia    Cellulitis of the left foot with ulceration -we will get MRI of the left foot to make sure there is no osteomyelitis.  We will keep patient on empiric antibiotics get wound team consult and based on MRI will have further plans. Hypoglycemia with  history of diabetes mellitus type 2 -last hemoglobin A1c about 52 days ago was 5.7.  Not sure what patient is taking at the living facility.  For now we are holding all antidiabetic medications and checking CBGs every hour.  Patient is able to take orally food.  Does have a PEG tube though   which patient states she rarely uses now.  Check hemoglobin A1c cortisol level and frequent CBG checks. Acute blood loss anemia suspect could be from the wound.  We will repeat CBC type and screen and transfuse if there is any further decrease in hemoglobin less than 7. History of recent stroke with PFO continue statins.  Need to verify home medicines.  Resume antiplatelet agents if there is no further bleeding. History of GBS with Miller Fisher variant.  Was treated with plasmapheresis early part of this year. Prior history of right AKA.  Stump does not show any signs of infection. Severe protein calorie malnutrition will need nutrition counseling and assessment. Right upper extremity edema check Dopplers. Transient elevated blood pressure readings noted.  Closely observe.  Since patient has infected left foot ulcer will need further management inpatient status.   DVT prophylaxis: Holding anticoagulants due to some bleeding from the left foot ulcer. Code Status: Full code. Family Communication: We will need to reach out to family. Disposition Plan: To be determined. Consults called: Wound team.  Nephrology was notified by the ER physician. Admission status: Inpatient.    N  MD [facility] Pager [phone].  If 7PM-7AM, please contact night-coverage [email or web address] Password [email or web address]  04/02/2022, 9:55 PM     

## 2022-04-02 NOTE — ED Provider Notes (Addendum)
[facility] EMERGENCY DEPARTMENT Provider Note   CSN: [id number] Arrival date & time: 04/02/22  0941     History  Chief Complaint  Patient presents with   Wound Infection    [name] is a 41 y.o. female. 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  Per EMS patient from [place] coming in over concerns about maggots in a left foot wound. Patient is a limited historian. She is alert and oriented today. She does state that her left foot hurts but is unable to tell me how long the wound has been there or if she has had fever. Denies nausea.   She was here on 11/22/21 for a left buttock stage 2 pressure ulcer that was not infection and discharged. She was then seen the next day on 11/23/21 for evaluation of a right stump wound. At that time, felt it to be necrosis of localized tissue and was to have   Called [facility] and spoke with Precious, wound care nurse. Last time RN changed the wound on Friday. Minimal drainage then, no odor. Saw maggots this morning, was instructed to send her to the ED.   HPI     Home Medications Prior to Admission medications   Medication Sig Start Date End Date Taking? Authorizing Provider  atorvastatin (LIPITOR) 40 MG tablet Place 1 tablet (40 mg total) into feeding tube daily. Patient taking differently: Take 40 mg by mouth daily. 10/26/21  Yes Shelly Coss, MD  cephALEXin (KEFLEX) 500 MG capsule Take 1 capsule (500 mg total) by mouth every other day for 3 doses. After hemodialysis for 3 doses only 04/06/22 04/11/22 Yes Pokhrel, Laxman, MD  cinacalcet (SENSIPAR) 90 MG tablet Take 90 mg by mouth daily. 09/22/19  Yes [provider]  dorzolamide-timolol (COSOPT) 22.3-6.8 MG/ML ophthalmic solution Place 1 drop into the left eye 2 (two) times daily.  03/30/21  Yes [provider]  epoetin alfa-epbx (RETACRIT) 09326 UNIT/ML injection Inject 10,000 Units into the vein See admin instructions. 10,000 units IM every Mon/Wed/Fri with dialysis   Yes [provider]  insulin glargine-yfgn (SEMGLEE) 100 UNIT/ML Pen Inject 5 Units into the skin 2 (two) times daily. 03/15/22  Yes [provider]  metFORMIN (GLUCOPHAGE) 500 MG tablet Take 500 mg by mouth daily. 03/31/22  Yes [provider]  metroNIDAZOLE (FLAGYL) 500 MG tablet Take 1 tablet (500 mg total) by mouth 3 (three) times daily for 7 days. 04/06/22 04/13/22 Yes Pokhrel, Laxman, MD  midodrine (PROAMATINE) 5 MG tablet Take 5 mg by mouth 3 (three) times daily. 03/03/22  Yes [provider]  Multiple Vitamin (MULTIVITAMIN WITH MINERALS) TABS tablet Take 1 tablet by mouth every morning.   Yes [provider]  nystatin (MYCOSTATIN) 100000 UNIT/ML suspension Take 5 mLs (500,000 Units total) by mouth 4 (four) times daily. 10/25/21  Yes Shelly Coss, MD  ondansetron (ZOFRAN) 4 MG tablet Take 4 mg by mouth every 6 (six) hours as needed for nausea/vomiting. 03/02/22  Yes [provider]  SANTYL 250 UNIT/GM ointment Apply 1 Application topically See admin instructions. 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 10/25/21  Yes Shelly Coss, MD  Water For Irrigation, Sterile (FREE  WATER) SOLN Place 30 mLs into feeding tube every 4 (four) hours. 10/25/21  Yes Shelly Coss, MD  Accu-Chek FastClix Lancets MISC Use as instructed to check blood sugar up to TID. E11.22 Patient taking differently: 1 each by Other route See admin instructions. Use as instructed to check blood sugar up to TID. E11.22 04/15/19   Charlott Rakes, MD  Blood Glucose Monitoring Suppl (ACCU-CHEK GUIDE ME) w/Device KIT 1 kit by Does not  apply route 3 (three) times daily. Use to check BG at home up to 3 times daily. E11.22 04/15/19   Charlott Rakes, MD  Continuous Blood Gluc Transmit (DEXCOM G6 TRANSMITTER) MISC 1 Device by Does not apply route as directed. 09/12/20   Shamleffer, Melanie Crazier, MD  glucose blood (ACCU-CHEK GUIDE) test strip Use as instructed to check blood sugar up to TID. E11.22 Patient taking differently: 1 each by Other route See admin instructions. Use as instructed to check blood sugar up to TID. E11.22 07/17/19   Fulp, Cammie, MD  insulin aspart (NOVOLOG) 100 UNIT/ML injection Inject 0-6 Units into the skin every 4 (four) hours. Patient not taking: Reported on 04/03/2022 10/25/21   Shelly Coss, MD  Insulin Pen Needle 32G X 4 MM MISC 1 Device by Does not apply route as directed. 05/13/20   Shamleffer, Melanie Crazier, MD  NON FORMULARY 30 mLs See admin instructions. ProHeal- 30 ml's, per tube, once a day Patient not taking: Reported on 04/03/2022    [provider]  nutrition supplement, JUVEN, (JUVEN) PACK Place 1 packet into feeding tube 2 (two) times daily between meals. Patient not taking: Reported on 04/03/2022 10/25/21   Shelly Coss, MD  Nutritional Supplements (FEEDING SUPPLEMENT, NEPRO CARB STEADY,) LIQD Place 960 mLs into feeding tube daily. Patient not taking: Reported on 04/03/2022 10/25/21   Shelly Coss, MD  Nutritional Supplements (FEEDING SUPPLEMENT, PROSOURCE TF,) liquid Place 45 mLs into feeding tube 2 (two) times daily. Patient not taking: Reported on 04/03/2022 10/25/21   Shelly Coss, MD  Nutritional Supplements (NEPRO) LIQD Place 60 mL/hr into feeding tube See admin instructions. 60 ml's/hr, per tube- Start infusion at 1600 and stop at 0800. 16 hours total every shift for nutritional support. Administer 60 ml's/hr via G-Tube continuously with a 30 ml's flush every 2 hours for 16 hours. Patient not taking: Reported on 04/03/2022    [provider]  oxyCODONE-acetaminophen  (PERCOCET) 5-325 MG tablet Take 1 tablet by mouth every 6 (six) hours as needed (for pain). 04/06/22 04/06/23  Pokhrel, Corrie Mckusick, MD  thiamine 100 MG tablet Place 1 tablet (100 mg total) into feeding tube daily. 10/26/21   Shelly Coss, MD      Allergies    Morphine, Peanut-containing drug products, Penicillins, and Chocolate    Review of Systems   Review of Systems  Skin:  Positive for wound.  All other systems reviewed and are negative.   Physical Exam Updated Vital Signs BP (!) 119/32 (BP Location: Left Arm)   Pulse 91   Temp 99.3 F (37.4 C) (Oral)   Resp 18   Wt 59.4 kg   SpO2 98%   BMI 24.74 kg/m  Physical Exam Vitals and nursing note reviewed.  Constitutional:      Appearance: Normal appearance.  HENT:     Head: Normocephalic.  Eyes:     General: No scleral icterus. Cardiovascular:     Rate and Rhythm: Normal rate and regular rhythm.     Pulses: Normal pulses.  Pulmonary:  Effort: Pulmonary effort is normal.     Breath sounds: Normal breath sounds.  Abdominal:     General: Bowel sounds are normal.     Palpations: Abdomen is soft.  Skin:    General: Skin is warm and dry.     Capillary Refill: Capillary refill takes less than 2 seconds.     Comments: Wound to the left foot at minimum stage 3. There is some purulence but no bone or tendon visualized. Tender to palpation. No maggots to wound as reported   Neurological:     General: No focal deficit present.     Mental Status: She is alert and oriented to person, place, and time.  Psychiatric:        Mood and Affect: Mood normal.        Behavior: Behavior normal.     ED Results / Procedures / Treatments   Labs (all labs ordered are listed, but only abnormal results are displayed) Labs Reviewed  COMPREHENSIVE METABOLIC PANEL - Abnormal; Notable for the following components:      Result Value   Chloride 94 (*)    Glucose, Bld 39 (*)    BUN 66 (*)    Creatinine, Ser 10.25 (*)    Calcium 7.8 (*)     Albumin 1.9 (*)    AST 14 (*)    GFR, Estimated 4 (*)    Anion gap 19 (*)    All other components within normal limits  CBC WITH DIFFERENTIAL/PLATELET - Abnormal; Notable for the following components:   WBC 18.4 (*)    RBC 3.41 (*)    Hemoglobin 7.7 (*)    HCT 26.1 (*)    MCV 76.5 (*)    MCH 22.6 (*)    MCHC 29.5 (*)    RDW 18.6 (*)    Platelets 521 (*)    Neutro Abs 16.2 (*)    Abs Immature Granulocytes 0.13 (*)    All other components within normal limits  CBC - Abnormal; Notable for the following components:   WBC 17.0 (*)    RBC 2.92 (*)    Hemoglobin 6.9 (*)    HCT 22.9 (*)    MCV 78.4 (*)    MCH 23.6 (*)    RDW 19.0 (*)    All other components within normal limits  HEMOGLOBIN A1C - Abnormal; Notable for the following components:   Hgb A1c MFr Bld 6.9 (*)    All other components within normal limits  COMPREHENSIVE METABOLIC PANEL - Abnormal; Notable for the following components:   Potassium 5.4 (*)    Chloride 97 (*)    CO2 19 (*)    Glucose, Bld 239 (*)    BUN 80 (*)    Creatinine, Ser 11.31 (*)    Calcium 7.0 (*)    Albumin 1.6 (*)    AST 11 (*)    GFR, Estimated 4 (*)    Anion gap 20 (*)    All other components within normal limits  HEPATITIS B SURFACE ANTIBODY,QUALITATIVE - Abnormal; Notable for the following components:   Hep B S Ab Reactive (*)    All other components within normal limits  GLUCOSE, CAPILLARY - Abnormal; Notable for the following components:   Glucose-Capillary 205 (*)    All other components within normal limits  GLUCOSE, CAPILLARY - Abnormal; Notable for the following components:   Glucose-Capillary 240 (*)    All other components within normal limits  GLUCOSE, CAPILLARY - Abnormal; Notable for  the following components:   Glucose-Capillary 312 (*)    All other components within normal limits  GLUCOSE, CAPILLARY - Abnormal; Notable for the following components:   Glucose-Capillary 201 (*)    All other components within normal limits   GLUCOSE, CAPILLARY - Abnormal; Notable for the following components:   Glucose-Capillary 136 (*)    All other components within normal limits  GLUCOSE, CAPILLARY - Abnormal; Notable for the following components:   Glucose-Capillary 246 (*)    All other components within normal limits  GLUCOSE, CAPILLARY - Abnormal; Notable for the following components:   Glucose-Capillary 235 (*)    All other components within normal limits  BASIC METABOLIC PANEL - Abnormal; Notable for the following components:   Sodium 134 (*)    Glucose, Bld 248 (*)    BUN 64 (*)    Creatinine, Ser 8.48 (*)    Calcium 8.1 (*)    GFR, Estimated 6 (*)    All other components within normal limits  CBC - Abnormal; Notable for the following components:   WBC 16.0 (*)    RBC 3.59 (*)    Hemoglobin 8.7 (*)    HCT 27.8 (*)    MCV 77.4 (*)    MCH 24.2 (*)    RDW 19.3 (*)    All other components within normal limits  GLUCOSE, CAPILLARY - Abnormal; Notable for the following components:   Glucose-Capillary 227 (*)    All other components within normal limits  GLUCOSE, CAPILLARY - Abnormal; Notable for the following components:   Glucose-Capillary 311 (*)    All other components within normal limits  GLUCOSE, CAPILLARY - Abnormal; Notable for the following components:   Glucose-Capillary 291 (*)    All other components within normal limits  GLUCOSE, CAPILLARY - Abnormal; Notable for the following components:   Glucose-Capillary 206 (*)    All other components within normal limits  GLUCOSE, CAPILLARY - Abnormal; Notable for the following components:   Glucose-Capillary 203 (*)    All other components within normal limits  CBG MONITORING, ED - Abnormal; Notable for the following components:   Glucose-Capillary 37 (*)    All other components within normal limits  CBG MONITORING, ED - Abnormal; Notable for the following components:   Glucose-Capillary 37 (*)    All other components within normal limits  CBG  MONITORING, ED - Abnormal; Notable for the following components:   Glucose-Capillary 103 (*)    All other components within normal limits  CBG MONITORING, ED - Abnormal; Notable for the following components:   Glucose-Capillary 107 (*)    All other components within normal limits  CBG MONITORING, ED - Abnormal; Notable for the following components:   Glucose-Capillary 42 (*)    All other components within normal limits  CBG MONITORING, ED - Abnormal; Notable for the following components:   Glucose-Capillary 138 (*)    All other components within normal limits  CBG MONITORING, ED - Abnormal; Notable for the following components:   Glucose-Capillary 145 (*)    All other components within normal limits  CBG MONITORING, ED - Abnormal; Notable for the following components:   Glucose-Capillary 149 (*)    All other components within normal limits  CBG MONITORING, ED - Abnormal; Notable for the following components:   Glucose-Capillary 174 (*)    All other components within normal limits  CBG MONITORING, ED - Abnormal; Notable for the following components:   Glucose-Capillary 235 (*)    All other components within  normal limits  AEROBIC/ANAEROBIC CULTURE W GRAM STAIN (SURGICAL/DEEP WOUND)  CULTURE, BLOOD (ROUTINE X 2)  CORTISOL  HEPATITIS B SURFACE ANTIGEN  HEPATITIS B SURFACE ANTIBODY, QUANTITATIVE  HEPATITIS B CORE ANTIBODY, TOTAL  HEPATITIS C ANTIBODY  CBG MONITORING, ED  CBG MONITORING, ED  TYPE AND SCREEN  PREPARE RBC (CROSSMATCH)    EKG EKG Interpretation  Date/Time:  Monday April 02 2022 10:04:21 EDT Ventricular Rate:  85 PR Interval:  120 QRS Duration: 82 QT Interval:  419 QTC Calculation: 499 R Axis:   45 Text Interpretation: Sinus rhythm Abnormal R-wave progression, early transition Borderline prolonged QT interval st changes decreased compare to prior ECG Confirmed by Dorie Rank [phone]) on 04/03/2022 11:01:47 AM  Radiology No results  found.  Procedures Procedures    Medications Ordered in ED Medications  vancomycin (VANCOREADY) IVPB 1500 mg/300 mL (0 mg Intravenous Stopped 04/02/22 1816)  fentaNYL (SUBLIMAZE) injection 50 mcg (50 mcg Intravenous Given 04/02/22 1610)  dextrose 50 % solution 50 mL (50 mLs Intravenous Given 04/02/22 1827)    ED Course/ Medical Decision Making/ A&P Clinical Course as of 04/11/22 1442  Mon Apr 02, 2022  1126 Called [facility] and was transferred to staff. Disconnected/they hung up.  Recalled back, Spoke with Maudie Mercury who stated the treatment nurse would call me back. I gave her my work Paediatric nurse.  [LA]  1538 IV team obtaining IV access at this time.  [SB]  8616 Discussed with patient treatment plan. Pt agreeable at this time.  [SB]  [place] Notified by RNs that IV team was unable to obtain lactic acid. Pt has had several IV team members attempt IV access. Discussed with RNs to proceed with Vancomycin at this time. [SB]  1642 Pt re-evaluated and noted improvement of symptoms with treatment regimen in the ED. Patient spouse at bedside who was informed that we are awaiting lab results at this time.  [SB]  1810 Notified that CBG was at 45 and RN gave the patient 2 cups of orange juice along with sugar in it. [SB]  1844 Blood pressure at 118/25, last went to dialysis on Friday. Discussed with patient lab and imaging findings as well as plans for admission. Pt agreeable at this time.  [SB]  1914 Consult with nephrologist, Dr. Royce Macadamia who will have the patient evaluated in the morning. [SB]  1932 Consult with hospitalist, Dr. Hal Hope who will evaluate the patient for admission.  [SB]    Clinical Course User Index [LA] Mickie Hillier, PA-C [SB] Blue, Soijett A, PA-C                           Medical Decision Making Amount and/or Complexity of Data Reviewed Labs: ordered. Radiology: ordered.  Risk Prescription drug management. Decision regarding hospitalization.  Care of patient handed off  to [employer], PA-C. Patient pending labs. She will need a dose of IV antibiotics. I have ordered for PO Clindamycin x2 weeks to be completed out patient. No evidence of osteomyelitis or maggots in her wound. She receives wound care at her facility which can be continued on her discharge. Clinically does not appear toxic and stable for discharge back home after her work up.  Final Clinical Impression(s) / ED Diagnoses Final diagnoses:  Wound infection  Hypoglycemia  AKI (acute kidney injury) ([place])    Rx / DC Orders ED Discharge Orders          Ordered    clindamycin (CLEOCIN) 150 MG  capsule  3 times daily,   Status:  Discontinued        04/06/22 0817    Increase activity slowly        04/06/22 0817    Discharge instructions       Comments: Follow up with your primary care provider at the skilled nursing facility in 3-5 days. Continue wound care. Seek medical attention for worsening symptoms.   04/06/22 0817    Diet Carb Modified        04/06/22 0817    Discharge wound care:       Comments: Wound care to left heel Unstageable pressure injury:  Wash foot with soap and water, rinse and dry. Place a betadine moistened gauze over the wound, top with dry gauze 4x4s and secure with Kerlix roll gauze/paper tape. Place foot into [employer]. Perform twice daily.   04/06/22 0817    Call MD for:  temperature >100.4        04/06/22 0817    oxyCODONE-acetaminophen (PERCOCET) 5-325 MG tablet  Every 6 hours PRN,   Status:  Discontinued        04/06/22 1458    metroNIDAZOLE (FLAGYL) 500 MG tablet  3 times daily        04/06/22 1519    cephALEXin (KEFLEX) 500 MG capsule  Every other day        04/06/22 1519    oxyCODONE-acetaminophen (PERCOCET) 5-325 MG tablet  Every 6 hours PRN,   Status:  Discontinued        04/06/22 1525    oxyCODONE-acetaminophen (PERCOCET) 5-325 MG tablet  Every 6 hours PRN,   Status:  Discontinued        04/06/22 1528    oxyCODONE-acetaminophen (PERCOCET) 5-325 MG tablet   Every 6 hours PRN,   Status:  Discontinued        04/06/22 1638    oxyCODONE-acetaminophen (PERCOCET) 5-325 MG tablet  Every 6 hours PRN        04/06/22 1648    clindamycin (CLEOCIN) 150 MG capsule  3 times daily,   Status:  Discontinued        04/02/22 1503              Mickie Hillier, PA-C 04/02/22 [place], [place], DO 04/02/22 1540    Mickie Hillier, PA-C 04/11/22 1445    [place], [place], DO 04/19/22 [phone]

## 2022-04-02 NOTE — ED Notes (Signed)
Patient transported to MRI 

## 2022-04-02 NOTE — ED Notes (Addendum)
Pt given 2 cups of orange juice with extra sugar. Pt A&O x4 resting in room.

## 2022-04-02 NOTE — ED Notes (Signed)
Patient wound rewrapped, drainage noted (cloudy).

## 2022-04-02 NOTE — ED Notes (Signed)
Admitting at bedside 

## 2022-04-03 ENCOUNTER — Encounter (HOSPITAL_COMMUNITY): Payer: Self-pay | Admitting: Internal Medicine

## 2022-04-03 ENCOUNTER — Inpatient Hospital Stay (HOSPITAL_COMMUNITY): Payer: Medicare Other

## 2022-04-03 DIAGNOSIS — R609 Edema, unspecified: Secondary | ICD-10-CM | POA: Diagnosis not present

## 2022-04-03 DIAGNOSIS — N186 End stage renal disease: Secondary | ICD-10-CM

## 2022-04-03 DIAGNOSIS — Z992 Dependence on renal dialysis: Secondary | ICD-10-CM

## 2022-04-03 DIAGNOSIS — L03116 Cellulitis of left lower limb: Secondary | ICD-10-CM

## 2022-04-03 DIAGNOSIS — Q2112 Patent foramen ovale: Secondary | ICD-10-CM | POA: Diagnosis not present

## 2022-04-03 DIAGNOSIS — E162 Hypoglycemia, unspecified: Secondary | ICD-10-CM

## 2022-04-03 LAB — COMPREHENSIVE METABOLIC PANEL
ALT: 10 U/L (ref 0–44)
AST: 11 U/L — ABNORMAL LOW (ref 15–41)
Albumin: 1.6 g/dL — ABNORMAL LOW (ref 3.5–5.0)
Alkaline Phosphatase: 104 U/L (ref 38–126)
Anion gap: 20 — ABNORMAL HIGH (ref 5–15)
BUN: 80 mg/dL — ABNORMAL HIGH (ref 6–20)
CO2: 19 mmol/L — ABNORMAL LOW (ref 22–32)
Calcium: 7 mg/dL — ABNORMAL LOW (ref 8.9–10.3)
Chloride: 97 mmol/L — ABNORMAL LOW (ref 98–111)
Creatinine, Ser: 11.31 mg/dL — ABNORMAL HIGH (ref 0.44–1.00)
GFR, Estimated: 4 mL/min — ABNORMAL LOW (ref >60)
Glucose, Bld: 239 mg/dL — ABNORMAL HIGH (ref 70–99)
Potassium: 5.4 mmol/L — ABNORMAL HIGH (ref 3.5–5.1)
Sodium: 136 mmol/L (ref 135–145)
Total Bilirubin: 0.7 mg/dL (ref 0.3–1.2)
Total Protein: 6.7 g/dL (ref 6.5–8.1)

## 2022-04-03 LAB — CORTISOL: Cortisol, Plasma: 19.1 ug/dL

## 2022-04-03 LAB — HEPATITIS B SURFACE ANTIGEN: Hepatitis B Surface Ag: NONREACTIVE

## 2022-04-03 LAB — CBC
HCT: 22.9 % — ABNORMAL LOW (ref 36.0–46.0)
Hemoglobin: 6.9 g/dL — CL (ref 12.0–15.0)
MCH: 23.6 pg — ABNORMAL LOW (ref 26.0–34.0)
MCHC: 30.1 g/dL (ref 30.0–36.0)
MCV: 78.4 fL — ABNORMAL LOW (ref 80.0–100.0)
Platelets: 369 10*3/uL (ref 150–400)
RBC: 2.92 MIL/uL — ABNORMAL LOW (ref 3.87–5.11)
RDW: 19 % — ABNORMAL HIGH (ref 11.5–15.5)
WBC: 17 10*3/uL — ABNORMAL HIGH (ref 4.0–10.5)
nRBC: 0 % (ref 0.0–0.2)

## 2022-04-03 LAB — CBG MONITORING, ED
Glucose-Capillary: 138 mg/dL — ABNORMAL HIGH (ref 70–99)
Glucose-Capillary: 145 mg/dL — ABNORMAL HIGH (ref 70–99)
Glucose-Capillary: 149 mg/dL — ABNORMAL HIGH (ref 70–99)
Glucose-Capillary: 174 mg/dL — ABNORMAL HIGH (ref 70–99)
Glucose-Capillary: 235 mg/dL — ABNORMAL HIGH (ref 70–99)
Glucose-Capillary: 42 mg/dL — CL (ref 70–99)

## 2022-04-03 LAB — HEPATITIS C ANTIBODY: HCV Ab: NONREACTIVE

## 2022-04-03 LAB — HEMOGLOBIN A1C
Hgb A1c MFr Bld: 6.9 % — ABNORMAL HIGH (ref 4.8–5.6)
Mean Plasma Glucose: 151.33 mg/dL

## 2022-04-03 LAB — HEPATITIS B SURFACE ANTIBODY,QUALITATIVE: Hep B S Ab: REACTIVE — AB

## 2022-04-03 LAB — HEPATITIS B CORE ANTIBODY, TOTAL: Hep B Core Total Ab: NONREACTIVE

## 2022-04-03 LAB — PREPARE RBC (CROSSMATCH)

## 2022-04-03 LAB — GLUCOSE, CAPILLARY: Glucose-Capillary: 205 mg/dL — ABNORMAL HIGH (ref 70–99)

## 2022-04-03 MED ORDER — CHLORHEXIDINE GLUCONATE CLOTH 2 % EX PADS
6.0000 | MEDICATED_PAD | Freq: Every day | CUTANEOUS | Status: DC
  Administered 2022-04-04 – 2022-04-05 (×2): 6 via TOPICAL

## 2022-04-03 MED ORDER — VANCOMYCIN HCL 750 MG/150ML IV SOLN
750.0000 mg | INTRAVENOUS | Status: DC
  Administered 2022-04-04: 750 mg via INTRAVENOUS
  Filled 2022-04-03 (×3): qty 150

## 2022-04-03 MED ORDER — OXYCODONE-ACETAMINOPHEN 5-325 MG PO TABS
1.0000 | ORAL_TABLET | Freq: Four times a day (QID) | ORAL | Status: DC | PRN
  Administered 2022-04-04 – 2022-04-06 (×5): 2 via ORAL
  Filled 2022-04-03 (×5): qty 2

## 2022-04-03 MED ORDER — ACETAMINOPHEN 325 MG PO TABS
650.0000 mg | ORAL_TABLET | Freq: Four times a day (QID) | ORAL | Status: DC | PRN
Start: 2022-04-03 — End: 2022-04-07
  Administered 2022-04-03: 650 mg via ORAL
  Filled 2022-04-03 (×2): qty 2

## 2022-04-03 MED ORDER — HYDROMORPHONE HCL 1 MG/ML IJ SOLN
1.0000 mg | INTRAMUSCULAR | Status: DC | PRN
  Administered 2022-04-06: 1 mg via INTRAVENOUS
  Filled 2022-04-03: qty 1

## 2022-04-03 MED ORDER — FENTANYL CITRATE PF 50 MCG/ML IJ SOSY: 50.0000 ug | PREFILLED_SYRINGE | Freq: Once | INTRAMUSCULAR | Status: DC

## 2022-04-03 MED ORDER — MORPHINE SULFATE (PF) 2 MG/ML IV SOLN: 2.0000 mg | INTRAVENOUS | Status: DC | PRN

## 2022-04-03 MED ORDER — SODIUM CHLORIDE 0.9% IV SOLUTION: Freq: Once | INTRAVENOUS | Status: DC

## 2022-04-03 NOTE — Consult Note (Signed)
Renal Service Consult Note [facility]  [name] 04/03/2022 [name], MD Requesting Physician: Dr. Louanne Belton  Reason for Consult: ESRD pt w/ L foot infection HPI: The patient is a 41 y.o. year-old w/ hx of anemia , anxiety, DM2, blind R eye, ESRD on HD, PAD sp R AKA,  hx CVA, hx GBS who presented to ED for evaluation of L foot ulcer which looked infected. In ED pt febrile to 101 F, WBC 18k and Hb 7. Xray did not show any bony involvement. Pt admitted and started on IV abx. MRI was ordered of the foot. We are asked to see for ESRD.    Pt seen in room, vague historian. On HD 10 years.   ROS - denies CP, no joint pain, no HA, no blurry vision, no rash, no diarrhea, no nausea/ vomiting, no dysuria, no difficulty voiding   Past Medical History  Past Medical History:  Diagnosis Date   Anemia    Anxiety    Blind right eye 2008   Diabetes mellitus without complication ([place])    Dialysis patient ([place])    Embolic stroke ([place])    ESRD (end stage renal disease) ([place])    HD on M/W/F   GBS (Guillain Barre syndrome) [facility])    Past Surgical History  Past Surgical History:  Procedure Laterality Date   AMPUTATION Right 08/11/2021   Procedure: TRANSMETATARSAL AMPUTATION OF RIGHT FOOT;  Surgeon: Serafina Mitchell, MD;  Location: [place];  Service: Vascular;  Laterality: Right;   AMPUTATION Right 09/17/2021   Procedure: AMPUTATION RIGHT BELOW KNEE;  Surgeon: Serafina Mitchell, MD;  Location: [place];  Service: Vascular;  Laterality: Right;   AMPUTATION Right 10/11/2021   Procedure: RIGHT ABOVE KNEE AMPUTATION;  Surgeon: Serafina Mitchell, MD;  Location: [place];  Service: Vascular;  Laterality: Right;   AMPUTATION TOE Left    APPLICATION OF WOUND VAC  08/16/2021   Procedure: APPLICATION OF WOUND VAC;  Surgeon: Serafina Mitchell, MD;  Location: [place];  Service: Vascular;;   AV FISTULA PLACEMENT     BUBBLE STUDY  10/02/2021   Procedure: BUBBLE STUDY;  Surgeon: Fay Records, MD;  Location:  [place];  Service: Cardiovascular;;   CESAREAN SECTION  2011   FISTULA SUPERFICIALIZATION Right 97/58/8325   Procedure: 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;  Surgeon: Angelia Mould, MD;  Location: [place];  Service: Vascular;  Laterality: Right;   INSERTION OF DIALYSIS CATHETER Left 07/27/2019   Procedure: INSERTION OF TUNNELED  DIALYSIS CATHETER;  Surgeon: Angelia Mould, MD;  Location: [place];  Service: Vascular;  Laterality: Left;   INSERTION OF DIALYSIS CATHETER Left 10/23/2021   Procedure: INSERTION OF TUNNELED PALINDROME PRECISION DIALYSIS CATHETER (23cm);  Surgeon: Marty Heck, MD;  Location: [facility] OR;  Service: Vascular;  Laterality: Left;   IR FLUORO GUIDE CV LINE LEFT  10/16/2021   IR FLUORO GUIDE CV LINE RIGHT  10/11/2021   IR GASTROSTOMY TUBE MOD SED  10/30/2021   IR US GUIDE VASC ACCESS LEFT  10/16/2021   IR US GUIDE VASC ACCESS RIGHT  10/11/2021   LOWER EXTREMITY ANGIOGRAPHY N/A 08/11/2021   Procedure: LOWER EXTREMITY ANGIOGRAPHY;  Surgeon: Serafina Mitchell, MD;  Location: [place] CV LAB;  Service: Cardiovascular;  Laterality: N/A;   REVISON OF ARTERIOVENOUS FISTULA Right 10/23/2021   Procedure: REVISON OF ARTERIOVENOUS FISTULA  ARM AND PLICATION;  Surgeon: Marty Heck, MD;  Location: [place];  Service:  Vascular;  Laterality: Right;   TEE WITHOUT CARDIOVERSION N/A 10/02/2021   Procedure: TRANSESOPHAGEAL ECHOCARDIOGRAM (TEE);  Surgeon: Fay Records, MD;  Location: [facility] ENDOSCOPY;  Service: Cardiovascular;  Laterality: N/A;   TRANSMETATARSAL AMPUTATION Right 08/16/2021   Procedure: CLOSURE OF RIGHT TRANSMETATARSAL AMPUTATION;  Surgeon: Serafina Mitchell, MD;  Location: [facility] OR;  Service: Vascular;  Laterality: Right;   Family History  Family History  Problem Relation Age of Onset   Diabetes Mother    Diabetes Father    Social History  reports that she has never smoked. She has never used smokeless tobacco. She reports that she does not  drink alcohol and does not use drugs. Allergies  Allergies  Allergen Reactions   Morphine Rash and Other (See Comments)   Peanut-Containing Drug Products Anaphylaxis and Hives   Penicillins Rash and Other (See Comments)    Rash in 2008.  Tolerated cefazolin in 2020   Chocolate Hives   Home medications Prior to Admission medications   Medication Sig Start Date End Date Taking? Authorizing Provider  atorvastatin (LIPITOR) 40 MG tablet Place 1 tablet (40 mg total) into feeding tube daily. Patient taking differently: Take 40 mg by mouth daily. 10/26/21  Yes Shelly Coss, MD  cinacalcet (SENSIPAR) 90 MG tablet Take 90 mg by mouth daily. 09/22/19  Yes [provider]  clindamycin (CLEOCIN) 150 MG capsule Take 3 capsules (450 mg total) by mouth 3 (three) times daily for 14 days. 04/02/22 04/16/22 Yes Mickie Hillier, PA-C  dorzolamide-timolol (COSOPT) 22.3-6.8 MG/ML ophthalmic solution Place 1 drop into the left eye 2 (two) times daily. 03/30/21  Yes [provider]  Nutritional Supplements (FEEDING SUPPLEMENT, NEPRO CARB STEADY,) LIQD Place 960 mLs into feeding tube daily. Patient taking differently: Take 240 mLs by mouth 3 (three) times daily after meals. 10/25/21  Yes Shelly Coss, MD  Water For Irrigation, Sterile (FREE WATER) SOLN Place 30 mLs into feeding tube every 4 (four) hours. 10/25/21  Yes Shelly Coss, MD  Accu-Chek FastClix Lancets MISC Use as instructed to check blood sugar up to TID. E11.22 Patient taking differently: 1 each by Other route See admin instructions. Use as instructed to check blood sugar up to TID. E11.22 04/15/19   Charlott Rakes, MD  Blood Glucose Monitoring Suppl (ACCU-CHEK GUIDE ME) w/Device KIT 1 kit by Does not apply route 3 (three) times daily. Use to check BG at home up to 3 times daily. E11.22 04/15/19   Charlott Rakes, MD  calcitRIOL (ROCALTROL) 0.25 MCG capsule Place 0.25 mcg into feeding tube 2 (two) times daily. Patient not taking: Reported  on 02/09/2022    [provider]  Continuous Blood Gluc Receiver (DEXCOM G6 RECEIVER) DEVI 1 Device by Does not apply route as directed. 09/12/20   Shamleffer, Melanie Crazier, MD  Continuous Blood Gluc Sensor (DEXCOM G6 SENSOR) MISC 1 Device by Does not apply route as directed. Patient not taking: Reported on 02/09/2022 09/12/20   Shamleffer, Melanie Crazier, MD  Continuous Blood Gluc Transmit (DEXCOM G6 TRANSMITTER) MISC 1 Device by Does not apply route as directed. 09/12/20   Shamleffer, Melanie Crazier, MD  epoetin alfa-epbx (RETACRIT) 40973 UNIT/ML injection 10,000 Units See admin instructions. 10,000 units IM every Mon/Wed/Fri with dialysis    [provider]  glucose blood (ACCU-CHEK GUIDE) test strip Use as instructed to check blood sugar up to TID. E11.22 Patient taking differently: 1 each by Other route See admin instructions. Use as instructed to check blood sugar up to TID. E11.22  07/17/19   Fulp, Cammie, MD  insulin aspart (NOVOLOG) 100 UNIT/ML injection Inject 0-6 Units into the skin every 4 (four) hours. 10/25/21   Shelly Coss, MD  Insulin Pen Needle 32G X 4 MM MISC 1 Device by Does not apply route as directed. 05/13/20   Shamleffer, Melanie Crazier, MD  Multiple Vitamin (MULTIVITAMIN WITH MINERALS) TABS tablet Take 1 tablet by mouth every morning.    [provider]  NON FORMULARY 30 mLs See admin instructions. ProHeal- 30 ml's, per tube, once a day    [provider]  nutrition supplement, JUVEN, (JUVEN) PACK Place 1 packet into feeding tube 2 (two) times daily between meals. 10/25/21   Shelly Coss, MD  Nutritional Supplements (FEEDING SUPPLEMENT, PROSOURCE TF,) liquid Place 45 mLs into feeding tube 2 (two) times daily. 10/25/21   Shelly Coss, MD  Nutritional Supplements (NEPRO) LIQD Place 60 mL/hr into feeding tube See admin instructions. 60 ml's/hr, per tube- Start infusion at 1600 and stop at 0800. 16 hours total every shift for nutritional  support. Administer 60 ml's/hr via G-Tube continuously with a 30 ml's flush every 2 hours for 16 hours.    [provider]  nystatin (MYCOSTATIN) 100000 UNIT/ML suspension Take 5 mLs (500,000 Units total) by mouth 4 (four) times daily. 10/25/21   Shelly Coss, MD  oxyCODONE-acetaminophen (PERCOCET) 5-325 MG tablet Take 1 tablet by mouth every 6 (six) hours as needed for severe pain. Patient taking differently: Take 1 tablet by mouth every 6 (six) hours as needed (for pain). 09/04/21   Ulyses Amor, PA-C  SANTYL 250 UNIT/GM ointment Apply 1 Application topically See admin instructions. Apply to right above-the-knee amputation topically every day shift- for wound    [provider]  sennosides (SENOKOT) 8.8 MG/5ML syrup Place 5 mLs into feeding tube 2 (two) times daily. 10/25/21   Shelly Coss, MD  thiamine 100 MG tablet Place 1 tablet (100 mg total) into feeding tube daily. 10/26/21   Shelly Coss, MD     Vitals:   04/03/22 0855 04/03/22 0900 04/03/22 1000 04/03/22 1230  BP: 101/88 (!) 109/97 (!) 101/42 (!) 102/48  Pulse: 79 81    Resp: _0 Temp: 100.1 F (37.8 C) 99.3 F (37.4 C)    TempSrc:  Oral    SpO2: 97% 98%     Exam Gen alert, no distress, a bit sluggish in her responses No rash, cyanosis or gangrene Sclera anicteric, throat clear  No jvd or bruits Chest clear bilat to bases, no rales/ wheezing RRR no MRG Abd soft ntnd no mass or ascites +bs GU defer MS R AKA wrapped, L foot w/ dressing Ext no LE or UE edema except focal RUE edema Neuro is alert, Ox 3 , nf    RUA AVF+bruit   Home meds include - atorvastatin, cinacalcet 90 qd, nepro carb steady, insulin, oxycodone prn, prns/ vits/ supps   OP HD: [place] MWF  3h 43mn  400/1.5  54.8kg  2/2 bath   P2  Hep none  RUA AVF  - last HD 8/18 - mircera 225 q2, last 8/18 - last Hb 8.2 on 8/14, tsat 11% on 8/14   Assessment/ Plan: L foot infection/ cellulitis - on IV abx vanc/ cefepime, MRI showed  no osteo.  ESRD - on HD MWF. Last HD Friday. Labs/ vol okay on exam. No strong need for HD today. Plan HD tomorrow.  BP/ vol - BP's low and stable, is not on BP lowering  meds at home.  Anemia esrd - Hb low 7-.75, last esa given last Friday. Not due for 1.5 weeks. Transfuse prn. Get fe/ tibc.  MBD ckd - CCa in range, add on phos.  H/o CVA - in march 2023 H/o GBS - in march 2023, rx'd w/ TPE PAD sp R AKA - in march 2023      Rob Camora Tremain  MD 04/03/2022, 1:26 PM Recent Labs  Lab 04/02/22 1540 04/03/22 0614  HGB 7.7* 6.9*  ALBUMIN 1.9*  --   CALCIUM 7.8*  --   CREATININE 10.25*  --   K 4.9  --

## 2022-04-03 NOTE — ED Notes (Signed)
Patient's brief changed again due to her running BM. Patient provided a new chux.

## 2022-04-03 NOTE — ED Notes (Signed)
Peri-care and full linen change provided

## 2022-04-03 NOTE — Progress Notes (Addendum)
Patient hgb is a 6.9 and order was placed to transfuse unit of PRBC.  After the blood consent was read to the patient, patient says that she does not want the blood, she said not right now. MD was notify via text page and amion.

## 2022-04-03 NOTE — Progress Notes (Signed)
Patient continues to refuse the blood transfusion and becomes irritable when educated on the importance of receiving treatment.

## 2022-04-03 NOTE — Progress Notes (Signed)
Right UE venous duplex study completed. Please see CV Proc for preliminary results.  Anderson Malta  Lofton Leon BS, RVT 04/03/2022 9:43 AM

## 2022-04-03 NOTE — Plan of Care (Signed)
  Problem: Education: Goal: Knowledge of General Education information will improve Description Including pain rating scale, medication(s)/side effects and non-pharmacologic comfort measures Outcome: Progressing   

## 2022-04-03 NOTE — ED Notes (Signed)
Wound Care at bedside.

## 2022-04-03 NOTE — ED Notes (Signed)
This RN took over pts care at 1500. From report, RN stated pt needs a new IV due to current IV not flushing. IV team at 1500 and flushed line with 20cc's and stated IV was patent. This RN went to check IV so pt could get blood transfusion, when flushing IV pt screamed "Ow it hurts". RN will place another consult to IV team.

## 2022-04-03 NOTE — ED Notes (Signed)
Breakfast order placed ?

## 2022-04-03 NOTE — ED Notes (Signed)
Patient changed into clean brief, chux , and gown due to her having a BM. Wound RN at bedside at this time.

## 2022-04-03 NOTE — ED Notes (Signed)
Pt refused phlebotomy.

## 2022-04-03 NOTE — Consult Note (Signed)
[place] Nurse Consult Note: Reason for Consult:Left lateral heel ulcer, chronic. Photos taken by EDP and uploaded to EHR.  Patient noted to hand left foot off of the bed surface, a finding often found in patient's with PAD. Patient is reporting pain in the LLE. Wound type:Unstageable pressure vs PAD vs neuropathic. Pressure Injury POA: Yes Measurement: 5cm x 7cm soft eschar obscuring depth Wound bed: nonviable tissue, soft, malodorous Drainage (amount, consistency, odor) Serosanguinous drainage in a small amount Periwound: dry, peeling skin noted at anterior foot Dressing procedure/placement/frequency: Guidance is provided for Bedside RN to perform wound care to Unstageable Pressure injury to left lateral heel consisting of cleansing with soap and water, rinsing and drying thoroughly, then applying a betadine dressing to the affected area. This is to be topped with dry gauze and secured with a Kerlix roll gauze/paper tape. The heel is to be floated via a pressure redistribution heel boot (Prevalon). Turning and repositioning is in place and further guidance is to minimize time in the supine position. The patient is incontinent of liquid stool and is wearing a brief. A previously healed pressure injury is noted to the sacrum (scarring). Mild moisture associated skin damage/irritant contact dermatitis is noted in the perineal area. Guidance is to use house skin care incontinence care products for cleansing and for moisture barrier protection twice daily and PRN incontinence. If admitted, no briefs are to be used in favor of DermaTherapy low friction coefficient (CoF) bed linen system.   Recommend consultation with Vascular or Orthopedic Surgery to evaluate for critical ischemia and limb salvage. If you agree, please order/arrange consult.   [place] nursing team will not follow, but will remain available to this patient, the nursing and medical teams.  Please re-consult if needed.  Thank you for inviting [place] to  participate in this patient's Plan of Care.  Maudie Flakes, MSN, RN, CNS, [place], Serita Grammes, [employer], [employer] phone:  [phone]

## 2022-04-03 NOTE — ED Notes (Signed)
DBIV

## 2022-04-03 NOTE — Progress Notes (Addendum)
PROGRESS NOTE    [name]  EXN:[id number] DOB: 1981-07-16 DOA: 04/02/2022 PCP: [facility]    Brief Narrative:  [name] is a 41 y.o. female with history of ESRD on Monday, Wednesday Friday recent admission in March of this year for stroke with PFO and GBS [name] variant was brought into the hospital after patient's left foot was noticed an ulcer which had maggots and signs of infection.  In the ED there was some bloody discharge from the wound.    X-rays of the foot do not show any signs of bony involvement.  Pulses on the left foot is dopplerable.  The right foot stump did not look infected. In the ED, patient was febrile with a temperature 101.1 F.  Initial labs showed a WBC at 18.4 with hemoglobin of 7.7.  Patient also has significant drop in hemoglobin from 11.7 about a month ago it is around 7.7.  Patient was then admitted to the  hospital for further evaluation and treatment.   Assessment and Plan: Principal Problem:   Cellulitis of left foot Active Problems:   ESRD on hemodialysis (HCC)   PFO (patent foramen ovale)   Cellulitis   Hypoglycemia   Cellulitis of the left foot with ulceration  Patient had leukocytosis on presentation with left leg cellulitis.  MRI of the left foot was done without any evidence of osteomyelitis.  Wound care on board we will continue empiric antibiotic wound care.  Follow white count and temperature curve.  Hypoglycemia with history of diabetes mellitus type 2 - Latest hemoglobin A1c around 2 months back was 5.7.  Patient does have PEG tube though which patient states she rarely uses now.  Hemoglobin A1c at 6.9 at this time.  We will continue with sliding scale insulin while in the hospital.  Patient is on insulin regimen at the skilled nursing facility.  Acute blood loss anemia suspect could be from the wound.  No other site of bleeding reported.  Transfuse 1 unit of packed RBC today.  Spoke with the patient  about it.  History of recent stroke with PFO  Hold off with antiplatelets due to significant hemoglobin drop to 6.9.  Continue Lipitor.  History of GBS with [name] variant.   Was treated with plasmapheresis early part of this year.  Currently at skilled nursing facility.  Prior history of right AKA.  Stump does not show any signs of infection.  Continue local dressing  Severe protein calorie malnutrition Present on admission. There is no height or weight on file to calculate BMI.  We will get dietary consultation.  Right upper extremity edema check Dopplers.  Transient elevated blood pressure.  Could be from pain and anxiety.  Closely monitor.  Not on antihypertensives as outpatient  ESRD on hemodialysis Monday Wednesday Friday.  Nephrology has been consulted for hemodialysis needs.    DVT prophylaxis: SCD   Code Status:     Code Status: Full Code  Disposition: Skilled nursing facility  Status is: Inpatient  Remains inpatient appropriate because: Left foot cellulitis, IV antibiotic, PRBC transfusion, from skilled nursing facility.   Family Communication: None at bedside.  Consultants:  Nephrology  Procedures:  Transfusion of 1 unit of packed RBC.  Antimicrobials:  Vancomycin and cefepime  Anti-infectives (From admission, onward)    Start     Dose/Rate Route Frequency Ordered Stop   04/04/22 1200  vancomycin (VANCOREADY) IVPB 750 mg/150 mL        750 mg  150 mL/hr over 60 Minutes Intravenous Every M-W-F (Hemodialysis) 04/03/22 0810     04/02/22 2215  ceFEPIme (MAXIPIME) 1 g in sodium chloride 0.9 % 100 mL IVPB        1 g 200 mL/hr over 30 Minutes Intravenous Every 24 hours 04/02/22 2154     04/02/22 2200  vancomycin (VANCOCIN) IVPB 1000 mg/200 mL premix  Status:  Discontinued        1,000 mg 200 mL/hr over 60 Minutes Intravenous  Once 04/02/22 2154 04/02/22 2200   04/02/22 1230  vancomycin (VANCOREADY) IVPB 1500 mg/300 mL        1,500 mg 150 mL/hr over  120 Minutes Intravenous  Once 04/02/22 1217 04/02/22 1816   04/02/22 0000  clindamycin (CLEOCIN) 150 MG capsule        450 mg Oral 3 times daily 04/02/22 1503 04/16/22 2359      Subjective: Today, patient was seen and examined at bedside.  Seen in the ED, patient complains of left foot pain.  Denies any nausea vomiting shortness of breath.  Objective: Vitals:   04/03/22 0800 04/03/22 0801 04/03/22 0855 04/03/22 0900  BP: 101/88 101/88 101/88 (!) 109/97  Pulse:   79 81  Resp: 15 15 15 15   Temp:   100.1 F (37.8 C) 99.3 F (37.4 C)  TempSrc:    Oral  SpO2:  97% 97% 98%   No intake or output data in the 24 hours ending 04/03/22 1208 There were no vitals filed for this visit.  Physical Examination: There is no height or weight on file to calculate BMI.   General:  Average built, not in obvious distress HENT:   No scleral pallor or icterus noted. Oral mucosa is moist.  Chest:  Diminished breath sounds bilaterally. No crackles or wheezes.  CVS: S1 &S2 heard. No murmur.  Regular rate and rhythm. Abdomen: Soft, nontender, nondistended.  Bowel sounds are heard.  PEG tube in place. Extremities: Right above-knee amputation, left leg foot cellulitis with dressing. Psych: Alert, awake and oriented, normal mood CNS:  No cranial nerve deficits.  Moving extremities. Skin: Warm and dry.  Left foot cellulitis with dressing.  Data Reviewed:   CBC: Recent Labs  Lab 04/02/22 1540 04/03/22 0614  WBC 18.4* 17.0*  NEUTROABS 16.2*  --   HGB 7.7* 6.9*  HCT 26.1* 22.9*  MCV 76.5* 78.4*  PLT 521* 829    Basic Metabolic Panel: Recent Labs  Lab 04/02/22 1540  NA 136  K 4.9  CL 94*  CO2 23  GLUCOSE 39*  BUN 66*  CREATININE 10.25*  CALCIUM 7.8*    Liver Function Tests: Recent Labs  Lab 04/02/22 1540  AST 14*  ALT 9  ALKPHOS 111  BILITOT 0.7  PROT 7.8  ALBUMIN 1.9*     Radiology Studies: VAS [place] UPPER EXTREMITY VENOUS DUPLEX  Result Date: 04/03/2022 UPPER VENOUS STUDY   Patient Name:  [name]  Date of Exam:   04/03/2022 Medical Rec #: [id number]      Accession #:    [id number] Date of Birth: Dec 16, 1980       Patient Gender: F Patient Age:   58 years Exam Location:  [facility] Procedure:      VAS [place] UPPER EXTREMITY VENOUS DUPLEX Referring Phys: Gean Birchwood --------------------------------------------------------------------------------  Indications: Edema Comparison Study: Previous exam dated 10/28/21 negative for DVT. Performing Technologist: Bobetta Lime BS, RVT  Examination Guidelines: A complete evaluation includes B-mode imaging, spectral Doppler, color Doppler, and power Doppler  as needed of all accessible portions of each vessel. Bilateral testing is considered an integral part of a complete examination. Limited examinations for reoccurring indications may be performed as noted.  Right Findings: +----------+------------+---------+-----------+----------+-------+ RIGHT     CompressiblePhasicitySpontaneousPropertiesSummary +----------+------------+---------+-----------+----------+-------+ IJV         Partial      Yes       Yes              Chronic +----------+------------+---------+-----------+----------+-------+ Subclavian  Partial      Yes       Yes              Chronic +----------+------------+---------+-----------+----------+-------+ Axillary    Partial      Yes       Yes              Chronic +----------+------------+---------+-----------+----------+-------+ Brachial      Full                                          +----------+------------+---------+-----------+----------+-------+ Radial        Full                                          +----------+------------+---------+-----------+----------+-------+ Ulnar         Full                                          +----------+------------+---------+-----------+----------+-------+ Cephalic                           Yes                       +----------+------------+---------+-----------+----------+-------+ Basilic       Full                                          +----------+------------+---------+-----------+----------+-------+ Right cepahlic vein (outflow vein for AVF) with calcification throughout and possible stenosis at the origin by color imaging.  Left Findings: +----------+------------+---------+-----------+----------+-------+ LEFT      CompressiblePhasicitySpontaneousPropertiesSummary +----------+------------+---------+-----------+----------+-------+ Subclavian    Full       Yes       Yes                      +----------+------------+---------+-----------+----------+-------+  Summary:  Right: No evidence of deep vein thrombosis in the upper extremity. No evidence of superficial vein thrombosis in the upper extremity. 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 Right brachio-cephalic AVF. Possible stenosis at the inflow vein origin (cephalic confluence at the subclavian).  *See table(s) above for measurements and observations.    Preliminary    MR FOOT LEFT WO CONTRAST  Result Date: 04/03/2022 CLINICAL DATA:  Diabetic foot swelling.  Wound infection EXAM: MRI OF THE LEFT FOOT WITHOUT CONTRAST TECHNIQUE: Multiplanar, multisequence MR imaging of the left forefoot was performed. No intravenous contrast was administered. COMPARISON:  04/02/2022 x-ray FINDINGS: Bones/Joint/Cartilage Postsurgical changes from second ray resection at the level of  the distal second metatarsal diaphysis. Well corticated resection margin without associated marrow signal abnormality. Hallux valgus deformity with moderate osteoarthritis of the first MTP joint. Mild osteoarthritis throughout the remaining joints of the forefoot. Mild degenerative subchondral marrow signal changes at the first and third MTP joints and IP joint of the great toe. No  joint effusion. No erosion or periostitis. No marrow replacement. Ligaments Intact Lisfranc ligament.  First MTP joint capsular thickening. Muscles and Tendons Denervation changes of the foot musculature. No tenosynovial fluid collection. Soft tissues Generalized soft tissue swelling and edema, most pronounced dorsally. No organized fluid collection. No deep soft tissue ulceration. IMPRESSION: 1. No evidence of osteomyelitis of the left forefoot. 2. Hallux valgus deformity with moderate osteoarthritis of the first MTP joint. 3. Postsurgical changes from second ray resection. 4. Generalized soft tissue swelling and edema, most pronounced dorsally. No organized fluid collection. Electronically Signed   By: Davina Poke D.O.   On: 04/03/2022 08:12   DG Foot Complete Left  Result Date: 04/02/2022 CLINICAL DATA:  Wound/infection of the left foot. Calcaneal osteomyelitis? EXAM: LEFT FOOT - COMPLETE 3+ VIEW COMPARISON:  None Available. FINDINGS: Postsurgical changes of prior surgical resection through the neck of the second metatarsal. Hallux valgus deformity with degenerative changes at the first metatarsophalangeal joint. No cortical erosion or periosteal reaction diffuse osteopenia however limits evaluation. Prominent vascular calcifications. Skin irregularity about the calcaneus suggesting skin ulcer. IMPRESSION: 1. Postsurgical changes from prior surgical resection to the second metatarsal neck. 2. No cortical erosion or periosteal reaction to suggest osteomyelitis, diffuse osteopenia however limits evaluation. If there is clinical concern for osteomyelitis, further evaluation with MR examination would be helpful. Electronically Signed   By: Keane Police D.O.   On: 04/02/2022 11:05      LOS: 1 day    Flora Lipps, MD [facility] Available via Epic secure chat 7am-7pm After these hours, please refer to coverage provider listed on [email or web address] 04/03/2022, 12:08 PM

## 2022-04-03 NOTE — ED Notes (Signed)
Unable to access IV, IV Team Notified

## 2022-04-04 DIAGNOSIS — N186 End stage renal disease: Secondary | ICD-10-CM | POA: Diagnosis not present

## 2022-04-04 DIAGNOSIS — E162 Hypoglycemia, unspecified: Secondary | ICD-10-CM | POA: Diagnosis not present

## 2022-04-04 DIAGNOSIS — Q2112 Patent foramen ovale: Secondary | ICD-10-CM | POA: Diagnosis not present

## 2022-04-04 DIAGNOSIS — L03116 Cellulitis of left lower limb: Secondary | ICD-10-CM | POA: Diagnosis not present

## 2022-04-04 LAB — GLUCOSE, CAPILLARY
Glucose-Capillary: 240 mg/dL — ABNORMAL HIGH (ref 70–99)
Glucose-Capillary: 312 mg/dL — ABNORMAL HIGH (ref 70–99)
Glucose-Capillary: 201 mg/dL — ABNORMAL HIGH (ref 70–99)
Glucose-Capillary: 136 mg/dL — ABNORMAL HIGH (ref 70–99)

## 2022-04-04 MED ORDER — ONDANSETRON HCL 4 MG PO TABS: 4.0000 mg | ORAL_TABLET | Freq: Four times a day (QID) | ORAL | Status: DC | PRN

## 2022-04-04 MED ORDER — MIDODRINE HCL 5 MG PO TABS
5.0000 mg | ORAL_TABLET | Freq: Three times a day (TID) | ORAL | Status: DC
  Administered 2022-04-04 – 2022-04-06 (×6): 5 mg via ORAL
  Filled 2022-04-04 (×6): qty 1

## 2022-04-04 MED ORDER — INSULIN ASPART 100 UNIT/ML IJ SOLN
0.0000 [IU] | Freq: Three times a day (TID) | INTRAMUSCULAR | Status: DC
  Administered 2022-04-04 – 2022-04-05 (×2): 2 [IU] via SUBCUTANEOUS
  Administered 2022-04-05: 3 [IU] via SUBCUTANEOUS
  Administered 2022-04-05 – 2022-04-06 (×3): 2 [IU] via SUBCUTANEOUS

## 2022-04-04 MED ORDER — MIDODRINE HCL 5 MG PO TABS
5.0000 mg | ORAL_TABLET | Freq: Three times a day (TID) | ORAL | Status: DC
  Filled 2022-04-04: qty 1

## 2022-04-04 MED ORDER — INSULIN GLARGINE-YFGN 100 UNIT/ML ~~LOC~~ SOLN
5.0000 [IU] | Freq: Two times a day (BID) | SUBCUTANEOUS | Status: DC
Start: 2022-04-04 — End: 2022-04-07
  Administered 2022-04-04 – 2022-04-06 (×5): 5 [IU] via SUBCUTANEOUS
  Filled 2022-04-04 (×8): qty 0.05

## 2022-04-04 MED ORDER — ORAL CARE MOUTH RINSE
15.0000 mL | OROMUCOSAL | Status: DC | PRN
Start: 2022-04-04 — End: 2022-04-07

## 2022-04-04 MED ORDER — INSULIN ASPART 100 UNIT/ML IJ SOLN
0.0000 [IU] | Freq: Every day | INTRAMUSCULAR | Status: DC
  Administered 2022-04-05: 4 [IU] via SUBCUTANEOUS
  Administered 2022-04-06: 2 [IU] via SUBCUTANEOUS

## 2022-04-04 NOTE — Progress Notes (Signed)
  Transition of Care [facility]) Screening Note   Patient Details  Name: [name] Date of Birth: 1980-08-25   Transition of Care [facility]) CM/SW Contact:    Tom-Johnson, Renea Ee, RN Phone Number: 04/04/2022, 2:36 PM  Patient is admitted for Cellulitis to Left foot. Patient has Rt AKA. On IV abx. Has hx of ESRD and on outpatient dialysis MWF schedule. Hgb on admission was 6.9, declines Blood transfusion but agreed to be transfused today in dialysis.   Transition of Care Department [facility]) has reviewed patient and no TOC needs or recommendations have been identified at this time. TOC will continue to monitor patient advancement through interdisciplinary progression rounds. If new patient transition needs arise, please place a TOC consult.

## 2022-04-04 NOTE — Progress Notes (Addendum)
PROGRESS NOTE    [name]  YKZ:[id number] DOB: 07-25-1981 DOA: 04/02/2022 PCP: [facility]    Brief Narrative:  [name] is a 41 y.o. female with history of ESRD on Monday, Wednesday Friday recent admission in March of this year for stroke with PFO and GBS [name] variant was brought into the hospital after patient's left foot was noticed an ulcer which had maggots and signs of infection.  In the ED, there was some bloody discharge from the wound.   X-rays of the foot do not show any signs of bony involvement.  Pulses on the left foot is dopplerable.  The right foot stump did not look infected. In the ED, patient was febrile with a temperature 101.1 F.  Initial labs showed a WBC at 18.4 with hemoglobin of 7.7.  Patient also has significant drop in hemoglobin from 11.7 about a month ago it is around 7.7.  Patient was then admitted to the  hospital for further evaluation and treatment.   Assessment and Plan: Principal Problem:   Cellulitis of left foot Active Problems:   ESRD on hemodialysis (HCC)   PFO (patent foramen ovale)   Cellulitis   Hypoglycemia   Cellulitis of the left foot with ulceration  Patient had leukocytosis on presentation with left leg cellulitis.  MRI of the left foot was done without any evidence of osteomyelitis.  Wound care on board we will continue empiric antibiotic for now with  wound care.  Temperature max of 99.6 F.  Local wound culture showing Proteus and gram-positive bacteria.  Blood culture negative in 1 day.  diabetes mellitus type 2 with episodes of hypoglycemia and hyperglycemia. Latest hemoglobin A1c around 2 months back was 5.7.  Patient does have PEG tube though which patient states she rarely uses now.  Hemoglobin A1c at 6.9 at this time.  We will continue with sliding scale insulin while in the hospital.  Patient is on insulin regimen at the skilled nursing facility.  Will resume Semglee starting today due to  hyperglycemia today.  Encourage nutrition.  Acute blood loss anemia suspect could be from the wound.  No other site of bleeding reported.  Had ordered  1 unit of packed RBC on 04/03/2022 but patient had refused transfusion.  Spoke with the patient during hemodialysis.  Will receive 1 unit of packed RBC during hemodialysis.  History of recent stroke with PFO  Hold off with antiplatelets due to significant hemoglobin drop to 6.9.  Continue Lipitor.  History of GBS with [name] variant.   Was treated with plasmapheresis early part of this year.  Currently at skilled nursing facility.  Prior history of right AKA.  Stump does not show any signs of infection.  Continue local dressing  Severe protein calorie malnutrition Present on admission. There is no height or weight on file to calculate BMI.  Consult dietitian.  Right upper extremity edema  Duplex ultrasound of the upper extremities without DVT.  Transient elevated blood pressure.  Could be from pain and anxiety. Not on antihypertensives as outpatient.  Improved at this time.  ESRD on hemodialysis Monday Wednesday Friday.  Nephrology has been consulted for hemodialysis needs.    DVT prophylaxis: Place and maintain sequential compression device Start: 04/03/22 [id number]   Code Status:     Code Status: Full Code  Disposition: Skilled nursing facility 1 to 2 days  Status is: Inpatient  Remains inpatient appropriate because: Left foot cellulitis, IV antibiotics PRBC transfusion, need for  hemodialysis, from skilled nursing facility.   Family Communication: None at bedside.  Consultants:  Nephrology  Procedures:  Transfusion of 1 unit of packed RBC. Hemodialysis  Antimicrobials:  Vancomycin and cefepime IV  Anti-infectives (From admission, onward)    Start     Dose/Rate Route Frequency Ordered Stop   04/04/22 1200  vancomycin (VANCOREADY) IVPB 750 mg/150 mL        750 mg 150 mL/hr over 60 Minutes Intravenous Every  M-W-F (Hemodialysis) 04/03/22 0810     04/02/22 2215  ceFEPIme (MAXIPIME) 1 g in sodium chloride 0.9 % 100 mL IVPB        1 g 200 mL/hr over 30 Minutes Intravenous Every 24 hours 04/02/22 2154     04/02/22 2200  vancomycin (VANCOCIN) IVPB 1000 mg/200 mL premix  Status:  Discontinued        1,000 mg 200 mL/hr over 60 Minutes Intravenous  Once 04/02/22 2154 04/02/22 2200   04/02/22 1230  vancomycin (VANCOREADY) IVPB 1500 mg/300 mL        1,500 mg 150 mL/hr over 120 Minutes Intravenous  Once 04/02/22 1217 04/02/22 1816   04/02/22 0000  clindamycin (CLEOCIN) 150 MG capsule        450 mg Oral 3 times daily 04/02/22 1503 04/16/22 2359      Subjective: Today, patient was seen and examined at bedside.  Seen during hemodialysis.  Patient feels cold.  Denies any pain, nausea, vomiting, fever, chills or rigor.   Objective: Vitals:   04/03/22 2058 04/03/22 2341 04/04/22 0411 04/04/22 0412  BP:  (!) 143/67  (!) 112/58  Pulse: 96 91  78  Resp: 18 18  18   Temp: 99.6 F (37.6 C)  98.1 F (36.7 C) 98.6 F (37 C)  TempSrc:   Axillary   SpO2: 94% 90%  97%    Intake/Output Summary (Last 24 hours) at 04/04/2022 0913 Last data filed at 04/04/2022 0800 Gross per 24 hour  Intake 1120 ml  Output --  Net 1120 ml   There were no vitals filed for this visit.  Physical Examination: There is no height or weight on file to calculate BMI.   General:  Average built, not in obvious distress closing eyes, HENT: Mild pallor noted.  Oral mucosa is moist.  Chest:  .  Diminished breath sounds bilaterally. No crackles or wheezes.  CVS: S1 &S2 heard. No murmur.  Regular rate and rhythm. Abdomen: Soft, nontender, nondistended.  Bowel sounds are heard.  PEG tube in place. Extremities: Upper extremity amputation, left leg with cellulitis and dressing. Psych: Alert, awake and oriented, normal mood CNS:  No cranial nerve deficits.  Power equal in all extremities.   Skin: Warm and dry.  Left leg cellulitis with  dressing.   CBC: Recent Labs  Lab 04/02/22 1540 04/03/22 0614  WBC 18.4* 17.0*  NEUTROABS 16.2*  --   HGB 7.7* 6.9*  HCT 26.1* 22.9*  MCV 76.5* 78.4*  PLT 521* 369     Basic Metabolic Panel: Recent Labs  Lab 04/02/22 1540 04/03/22 2008  NA 136 136  K 4.9 5.4*  CL 94* 97*  CO2 23 19*  GLUCOSE 39* 239*  BUN 66* 80*  CREATININE 10.25* 11.31*  CALCIUM 7.8* 7.0*     Liver Function Tests: Recent Labs  Lab 04/02/22 1540 04/03/22 2008  AST 14* 11*  ALT 9 10  ALKPHOS 111 104  BILITOT 0.7 0.7  PROT 7.8 6.7  ALBUMIN 1.9* 1.6*  Radiology Studies: VAS [place] UPPER EXTREMITY VENOUS DUPLEX  Result Date: 04/03/2022 UPPER VENOUS STUDY  Patient Name:  [name]  Date of Exam:   04/03/2022 Medical Rec #: [id number]      Accession #:    [id number] Date of Birth: [birth year]       Patient Gender: F Patient Age:   84 years Exam Location:  [facility] Procedure:      VAS [place] UPPER EXTREMITY VENOUS DUPLEX Referring Phys: Gean Birchwood --------------------------------------------------------------------------------  Indications: Edema Comparison Study: Previous exam dated 10/28/21 negative for DVT. Performing Technologist: Bobetta Lime BS, RVT  Examination Guidelines: A complete evaluation includes B-mode imaging, spectral Doppler, color Doppler, and power Doppler as needed of all accessible portions of each vessel. Bilateral testing is considered an integral part of a complete examination. Limited examinations for reoccurring indications may be performed as noted.  Right Findings: +----------+------------+---------+-----------+----------+-------+ RIGHT     CompressiblePhasicitySpontaneousPropertiesSummary +----------+------------+---------+-----------+----------+-------+ IJV         Partial      Yes       Yes              Chronic +----------+------------+---------+-----------+----------+-------+ Subclavian  Partial      Yes       Yes              Chronic  +----------+------------+---------+-----------+----------+-------+ Axillary    Partial      Yes       Yes              Chronic +----------+------------+---------+-----------+----------+-------+ Brachial      Full                                          +----------+------------+---------+-----------+----------+-------+ Radial        Full                                          +----------+------------+---------+-----------+----------+-------+ Ulnar         Full                                          +----------+------------+---------+-----------+----------+-------+ Cephalic                           Yes                      +----------+------------+---------+-----------+----------+-------+ Basilic       Full                                          +----------+------------+---------+-----------+----------+-------+ Right cepahlic vein (outflow vein for AVF) with calcification throughout and possible stenosis at the origin by color imaging.  Left Findings: +----------+------------+---------+-----------+----------+-------+ LEFT      CompressiblePhasicitySpontaneousPropertiesSummary +----------+------------+---------+-----------+----------+-------+ Subclavian    Full       Yes       Yes                      +----------+------------+---------+-----------+----------+-------+  Summary:  Right: No evidence of deep vein thrombosis in the upper  extremity. No evidence of superficial vein thrombosis in the upper extremity. 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 Right brachio-cephalic AVF. Possible stenosis at the inflow vein origin (cephalic confluence at the subclavian).  *See table(s) above for measurements and observations.  Diagnosing physician: Servando Snare MD Electronically signed by Servando Snare MD on 04/03/2022 at 2:11:18 PM.    Final    MR FOOT LEFT WO  CONTRAST  Result Date: 04/03/2022 CLINICAL DATA:  Diabetic foot swelling.  Wound infection EXAM: MRI OF THE LEFT FOOT WITHOUT CONTRAST TECHNIQUE: Multiplanar, multisequence MR imaging of the left forefoot was performed. No intravenous contrast was administered. COMPARISON:  04/02/2022 x-ray FINDINGS: Bones/Joint/Cartilage Postsurgical changes from second ray resection at the level of the distal second metatarsal diaphysis. Well corticated resection margin without associated marrow signal abnormality. Hallux valgus deformity with moderate osteoarthritis of the first MTP joint. Mild osteoarthritis throughout the remaining joints of the forefoot. Mild degenerative subchondral marrow signal changes at the first and third MTP joints and IP joint of the great toe. No joint effusion. No erosion or periostitis. No marrow replacement. Ligaments Intact Lisfranc ligament.  First MTP joint capsular thickening. Muscles and Tendons Denervation changes of the foot musculature. No tenosynovial fluid collection. Soft tissues Generalized soft tissue swelling and edema, most pronounced dorsally. No organized fluid collection. No deep soft tissue ulceration. IMPRESSION: 1. No evidence of osteomyelitis of the left forefoot. 2. Hallux valgus deformity with moderate osteoarthritis of the first MTP joint. 3. Postsurgical changes from second ray resection. 4. Generalized soft tissue swelling and edema, most pronounced dorsally. No organized fluid collection. Electronically Signed   By: Davina Poke D.O.   On: 04/03/2022 08:12   DG Foot Complete Left  Result Date: 04/02/2022 CLINICAL DATA:  Wound/infection of the left foot. Calcaneal osteomyelitis? EXAM: LEFT FOOT - COMPLETE 3+ VIEW COMPARISON:  None Available. FINDINGS: Postsurgical changes of prior surgical resection through the neck of the second metatarsal. Hallux valgus deformity with degenerative changes at the first metatarsophalangeal joint. No cortical erosion or  periosteal reaction diffuse osteopenia however limits evaluation. Prominent vascular calcifications. Skin irregularity about the calcaneus suggesting skin ulcer. IMPRESSION: 1. Postsurgical changes from prior surgical resection to the second metatarsal neck. 2. No cortical erosion or periosteal reaction to suggest osteomyelitis, diffuse osteopenia however limits evaluation. If there is clinical concern for osteomyelitis, further evaluation with MR examination would be helpful. Electronically Signed   By: Keane Police D.O.   On: 04/02/2022 11:05      LOS: 2 days    Flora Lipps, MD [facility] Available via Epic secure chat 7am-7pm After these hours, please refer to coverage provider listed on [email or web address] 04/04/2022, 9:13 AM

## 2022-04-04 NOTE — Progress Notes (Signed)
[facility] Progress Note   Subjective:   Pt seen in room, resting with eyes closed. Reportedly refused blood transfusion yesterday but is agreeable to transfusion on HD. Denies SOB, CP, dizziness, and nausea.   Objective Vitals:   04/04/22 1030 04/04/22 1100 04/04/22 1130 04/04/22 1204  BP: (!) 123/49 (!) 124/22 (!) 113/38 (!) 126/34  Pulse: 84 83 84 88  Resp: 12 12 13    Temp:    (!) 97.5 F (36.4 C)  TempSrc:    Oral  SpO2: 98% 98% 98%    Physical Exam General: Alert female in NAD Heart: RRR, no murmurs, rubs or gallops Lungs: CTA bilaterally without wheezing, rhonchi or rales Abdomen: Soft, non-distended +BS Extremities: No edema L leg, R AKA Dialysis Access: AVF+ bruit  Additional Objective Labs: Basic Metabolic Panel: Recent Labs  Lab 04/02/22 1540 04/03/22 2008  NA 136 136  K 4.9 5.4*  CL 94* 97*  CO2 23 19*  GLUCOSE 39* 239*  BUN 66* 80*  CREATININE 10.25* 11.31*  CALCIUM 7.8* 7.0*   Liver Function Tests: Recent Labs  Lab 04/02/22 1540 04/03/22 2008  AST 14* 11*  ALT 9 10  ALKPHOS 111 104  BILITOT 0.7 0.7  PROT 7.8 6.7  ALBUMIN 1.9* 1.6*   No results for input(s): "LIPASE", "AMYLASE" in the last 168 hours. CBC: Recent Labs  Lab 04/02/22 1540 04/03/22 0614  WBC 18.4* 17.0*  NEUTROABS 16.2*  --   HGB 7.7* 6.9*  HCT 26.1* 22.9*  MCV 76.5* 78.4*  PLT 521* 369   Blood Culture    Component Value Date/Time   SDES BLOOD RIGHT HAND 04/03/2022 0635   SPECREQUEST  04/03/2022 0635    BOTTLES DRAWN AEROBIC ONLY Blood Culture adequate volume   CULT  04/03/2022 0635    NO GROWTH 1 DAY Performed at [facility], [place] [address], [place], [place] [address]    REPTSTATUS PENDING 04/03/2022 [phone]    Cardiac Enzymes: No results for input(s): "CKTOTAL", "CKMB", "CKMBINDEX", "TROPONINI" in the last 168 hours. CBG: Recent Labs  Lab 04/03/22 0911 04/03/22 1608 04/03/22 1733 04/03/22 2059 04/04/22 0722  GLUCAP 174* 235* 240*  205* 312*   Iron Studies: No results for input(s): "IRON", "TIBC", "TRANSFERRIN", "FERRITIN" in the last 72 hours. @lablastinr3 @ Studies/Results: VAS [place] UPPER EXTREMITY VENOUS DUPLEX  Result Date: 04/03/2022 UPPER VENOUS STUDY  Patient Name:  [name]  Date of Exam:   04/03/2022 Medical Rec #: [id number]      Accession #:    [id number] Date of Birth: 1980/12/14       Patient Gender: F Patient Age:   41 years Exam Location:  [facility] Procedure:      VAS [place] UPPER EXTREMITY VENOUS DUPLEX Referring Phys: Gean Birchwood --------------------------------------------------------------------------------  Indications: Edema Comparison Study: Previous exam dated 10/28/21 negative for DVT. Performing Technologist: Bobetta Lime BS, RVT  Examination Guidelines: A complete evaluation includes B-mode imaging, spectral Doppler, color Doppler, and power Doppler as needed of all accessible portions of each vessel. Bilateral testing is considered an integral part of a complete examination. Limited examinations for reoccurring indications may be performed as noted.  Right Findings: +----------+------------+---------+-----------+----------+-------+ RIGHT     CompressiblePhasicitySpontaneousPropertiesSummary +----------+------------+---------+-----------+----------+-------+ IJV         Partial      Yes       Yes              Chronic +----------+------------+---------+-----------+----------+-------+ Subclavian  Partial      Yes  Yes              Chronic +----------+------------+---------+-----------+----------+-------+ Axillary    Partial      Yes       Yes              Chronic +----------+------------+---------+-----------+----------+-------+ Brachial      Full                                          +----------+------------+---------+-----------+----------+-------+ Radial        Full                                           +----------+------------+---------+-----------+----------+-------+ Ulnar         Full                                          +----------+------------+---------+-----------+----------+-------+ Cephalic                           Yes                      +----------+------------+---------+-----------+----------+-------+ Basilic       Full                                          +----------+------------+---------+-----------+----------+-------+ Right cepahlic vein (outflow vein for AVF) with calcification throughout and possible stenosis at the origin by color imaging.  Left Findings: +----------+------------+---------+-----------+----------+-------+ LEFT      CompressiblePhasicitySpontaneousPropertiesSummary +----------+------------+---------+-----------+----------+-------+ Subclavian    Full       Yes       Yes                      +----------+------------+---------+-----------+----------+-------+  Summary:  Right: No evidence of deep vein thrombosis in the upper extremity. No evidence of superficial vein thrombosis in the upper extremity. 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 Right brachio-cephalic AVF. Possible stenosis at the inflow vein origin (cephalic confluence at the subclavian).  *See table(s) above for measurements and observations.  Diagnosing physician: Servando Snare MD Electronically signed by Servando Snare MD on 04/03/2022 at 2:11:18 PM.    Final    MR FOOT LEFT WO CONTRAST  Result Date: 04/03/2022 CLINICAL DATA:  Diabetic foot swelling.  Wound infection EXAM: MRI OF THE LEFT FOOT WITHOUT CONTRAST TECHNIQUE: Multiplanar, multisequence MR imaging of the left forefoot was performed. No intravenous contrast was administered. COMPARISON:  04/02/2022 x-ray FINDINGS: Bones/Joint/Cartilage Postsurgical changes from second ray resection at the level of the distal second  metatarsal diaphysis. Well corticated resection margin without associated marrow signal abnormality. Hallux valgus deformity with moderate osteoarthritis of the first MTP joint. Mild osteoarthritis throughout the remaining joints of the forefoot. Mild degenerative subchondral marrow signal changes at the first and third MTP joints and IP joint of the great toe. No joint effusion. No erosion or periostitis. No marrow replacement. Ligaments Intact Lisfranc ligament.  First MTP joint capsular thickening. Muscles and  Tendons Denervation changes of the foot musculature. No tenosynovial fluid collection. Soft tissues Generalized soft tissue swelling and edema, most pronounced dorsally. No organized fluid collection. No deep soft tissue ulceration. IMPRESSION: 1. No evidence of osteomyelitis of the left forefoot. 2. Hallux valgus deformity with moderate osteoarthritis of the first MTP joint. 3. Postsurgical changes from second ray resection. 4. Generalized soft tissue swelling and edema, most pronounced dorsally. No organized fluid collection. Electronically Signed   By: Davina Poke D.O.   On: 04/03/2022 08:12   Medications:  ceFEPime (MAXIPIME) IV Stopped (04/04/22 0112)   vancomycin      sodium chloride   Intravenous Once   atorvastatin  40 mg Per Tube Daily   Chlorhexidine Gluconate Cloth  6 each Topical Q0600   dorzolamide-timolol  1 drop Left Eye BID   insulin aspart  0-5 Units Subcutaneous QHS   insulin aspart  0-6 Units Subcutaneous TID WC   insulin glargine-yfgn  5 Units Subcutaneous BID   midodrine  5 mg Oral TID WC   sodium chloride flush  3 mL Intravenous Q12H    OP HD: [place] [facility] MWF  3h 25min  400/1.5  54.8kg  2/2 bath   P2  Hep none  RUA AVF  - last HD 8/18 - mircera 225 q2, last 8/18 - last Hb 8.2 on 8/14, tsat 11% on 8/14  Assessment/Plan: L foot infection/ cellulitis - on IV abx vanc/ cefepime, MRI showed no osteo.  ESRD - on HD MWF. Last HD Friday, missed Monday, Resume MWF  schedule with HD today BP/ vol - BP's controlled, is not on BP lowering meds at home.  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 Not due for 1.5 weeks. Agrees to 1 unit PRBC with HD today.  MBD ckd - CCa in range (albumin very low), checking phos H/o CVA - in march 2023 H/o GBS - in march 2023, rx'd w/ TPE PAD sp R AKA - in march 2023  Anice Paganini, PA-C 04/04/2022, 12:15 PM  [facility] Pager: [phone]

## 2022-04-05 DIAGNOSIS — Q2112 Patent foramen ovale: Secondary | ICD-10-CM | POA: Diagnosis not present

## 2022-04-05 DIAGNOSIS — E162 Hypoglycemia, unspecified: Secondary | ICD-10-CM | POA: Diagnosis not present

## 2022-04-05 DIAGNOSIS — L03116 Cellulitis of left lower limb: Secondary | ICD-10-CM | POA: Diagnosis not present

## 2022-04-05 DIAGNOSIS — N186 End stage renal disease: Secondary | ICD-10-CM | POA: Diagnosis not present

## 2022-04-05 LAB — BPAM RBC
Blood Product Expiration Date: 202309272359
ISSUE DATE / TIME: 202308231131
Unit Type and Rh: 5100

## 2022-04-05 LAB — TYPE AND SCREEN
ABO/RH(D): O POS
Antibody Screen: NEGATIVE
Unit division: 0

## 2022-04-05 LAB — AEROBIC/ANAEROBIC CULTURE W GRAM STAIN (SURGICAL/DEEP WOUND): Special Requests: NORMAL

## 2022-04-05 LAB — HEPATITIS B SURFACE ANTIBODY, QUANTITATIVE: Hep B S AB Quant (Post): >1000 m[IU]/mL (ref >9.9)

## 2022-04-05 LAB — GLUCOSE, CAPILLARY
Glucose-Capillary: 246 mg/dL — ABNORMAL HIGH (ref 70–99)
Glucose-Capillary: 235 mg/dL — ABNORMAL HIGH (ref 70–99)

## 2022-04-05 MED ORDER — SODIUM CHLORIDE 0.9 % IV SOLN
1.0000 g | INTRAVENOUS | Status: DC
  Administered 2022-04-05 – 2022-04-06 (×2): 1 g via INTRAVENOUS
  Filled 2022-04-05 (×2): qty 10

## 2022-04-05 MED ORDER — CHLORHEXIDINE GLUCONATE CLOTH 2 % EX PADS
6.0000 | MEDICATED_PAD | Freq: Every day | CUTANEOUS | Status: DC
  Administered 2022-04-06: 6 via TOPICAL

## 2022-04-05 MED ORDER — NEPRO/CARBSTEADY PO LIQD: 237.0000 mL | Freq: Two times a day (BID) | ORAL | Status: DC

## 2022-04-05 MED ORDER — RENA-VITE PO TABS
1.0000 | ORAL_TABLET | Freq: Every day | ORAL | Status: DC
  Administered 2022-04-05 – 2022-04-06 (×2): 1 via ORAL
  Filled 2022-04-05 (×2): qty 1

## 2022-04-05 NOTE — Progress Notes (Signed)
PROGRESS NOTE    [name]  ZYS:[id number] DOB: 12/04/1980 DOA: 04/02/2022 PCP: [facility]    Brief Narrative:  [name] is a 41 y.o. 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  In the ED there was some bloody discharge from the wound.    X-rays of the foot do not show any signs of bony involvement.  Pulses on the left foot is dopplerable.  The right foot stump did not look infected. In the ED, patient was febrile with a temperature 101.1 F.  Initial labs showed a WBC at 18.4 with hemoglobin of 7.7.  Patient also has significant drop in hemoglobin from 11.7 about a month ago it is around 7.7.  Patient was then admitted to the  hospital for further evaluation and treatment.   Assessment and Plan: Principal Problem:   Cellulitis of left foot Active Problems:   ESRD on hemodialysis (HCC)   PFO (patent foramen ovale)   Cellulitis   Hypoglycemia   Cellulitis of the left foot with ulceration  Patient had leukocytosis on presentation with left leg cellulitis.  MRI of the left foot was done without any evidence of osteomyelitis.  Wound care on board we will continue empiric antibiotic wound care.  CBC from today pending.  We will continue to monitor leukocytosis.  Hypoglycemia with history of diabetes mellitus type 2 - Latest hemoglobin A1c around 2 months back was 5.7.  Patient does have PEG tube though which patient states she rarely uses now.  Hemoglobin A1c at 6.9 at this time.  We will continue with sliding scale insulin while in the hospital.  Patient is on insulin regimen at the skilled nursing facility.  Acute blood loss anemia suspect could be from the wound.  No other site of bleeding reported.  Status post 1 unit of unit of  packed RBC transfusion with hemodialysis on 04/04/2022.  CBC pending from today.  History of recent stroke with PFO  Hold off with antiplatelets due to significant hemoglobin drop to 6.9.  Continue Lipitor.  Check CBC again.  History of GBS with [name].   Was treated with plasmapheresis early part of this year.  Currently at skilled nursing facility.  Prior history of right AKA.  Stump does not show any signs of infection.  Continue local dressing  Severe protein calorie malnutrition Present on admission. There is no height or weight on file to calculate BMI.  We will get dietary consultation.  Right upper extremity edema check Dopplers.  Transient elevated blood pressure.  Could be from pain and anxiety.  Closely monitor.  Not on antihypertensives as outpatient  ESRD on hemodialysis Monday Wednesday Friday.  Nephrology has been consulted for hemodialysis needs.    DVT prophylaxis: Place and maintain sequential compression device Start: 04/03/22 1344SCD   Code Status:     Code Status: Full Code  Disposition: Skilled nursing facility likely by tomorrow if improving WBC.  Status is: Inpatient  Remains inpatient appropriate because: Left foot cellulitis on IV antibiotic, from skilled nursing facility.   Family Communication:  None at bedside.  Consultants:  Nephrology  Procedures:  Transfusion of 1 unit of packed RBC. Hemodialysis  Antimicrobials:  Vancomycin and cefepime  Anti-infectives (From admission,  onward)    Start     Dose/Rate Route Frequency Ordered Stop   04/04/22 1200  vancomycin (VANCOREADY) IVPB 750 mg/150 mL        750 mg 150 mL/hr over 60 Minutes Intravenous Every M-W-F (Hemodialysis) 04/03/22 0810     04/02/22 2215  ceFEPIme (MAXIPIME) 1 g in sodium chloride 0.9 % 100 mL IVPB        1 g 200 mL/hr over 30 Minutes Intravenous Every 24 hours 04/02/22 2154     04/02/22 2200  vancomycin (VANCOCIN) IVPB 1000 mg/200 mL premix  Status:   Discontinued        1,000 mg 200 mL/hr over 60 Minutes Intravenous  Once 04/02/22 2154 04/02/22 2200   04/02/22 1230  vancomycin (VANCOREADY) IVPB 1500 mg/300 mL        1,500 mg 150 mL/hr over 120 Minutes Intravenous  Once 04/02/22 1217 04/02/22 1816   04/02/22 0000  clindamycin (CLEOCIN) 150 MG capsule        450 mg Oral 3 times daily 04/02/22 1503 04/16/22 2359      Subjective: Today, patient was seen and examined at bedside.  Patient denies overt symptoms.  Complains of mild pain in the PEG tube site area.  Objective: Vitals:   04/04/22 1632 04/04/22 2252 04/05/22 0500 04/05/22 1115  BP: (!) 135/33 (!) 126/36 96/74 139/74  Pulse: 87 93 90 92  Resp: 16 16 15 17   Temp: 98.3 F (36.8 C) 99.6 F (37.6 C) 98.4 F (36.9 C) 98.1 F (36.7 C)  TempSrc:  Oral Oral   SpO2: 100% 100% 100% 98%    Intake/Output Summary (Last 24 hours) at 04/05/2022 1320 Last data filed at 04/05/2022 0800 Gross per 24 hour  Intake 1180 ml  Output 2900 ml  Net -1720 ml   There were no vitals filed for this visit.  Physical Examination: There is no height or weight on file to calculate BMI.   General:  Average built, not in obvious distress HENT:   No scleral pallor or icterus noted. Oral mucosa is moist.  Chest:  .  Diminished breath sounds bilaterally. No crackles or wheezes.  CVS: S1 &S2 heard. No murmur.  Regular rate and rhythm. Abdomen: Soft, nontender, nondistended.  Bowel sounds are heard.  PEG tube in place without any induration. Extremities: Right above-knee amputation, left leg cellulitis/ulceration with dressing. Psych: Alert, awake and oriented, normal mood CNS:  No cranial nerve deficits.  Power equal in all extremities.   Skin: Warm and dry.  Left foot cellulitis with dressing, foot ulcer on presentation as below.    Data Reviewed:   CBC: Recent Labs  Lab 04/02/22 1540 04/03/22 0614  WBC 18.4* 17.0*  NEUTROABS 16.2*  --   HGB 7.7* 6.9*  HCT 26.1* 22.9*  MCV 76.5* 78.4*   PLT 521* 369     Basic Metabolic Panel: Recent Labs  Lab 04/02/22 1540 04/03/22 2008  NA 136 136  K 4.9 5.4*  CL 94* 97*  CO2 23 19*  GLUCOSE 39* 239*  BUN 66* 80*  CREATININE 10.25* 11.31*  CALCIUM 7.8* 7.0*     Liver Function Tests: Recent Labs  Lab 04/02/22 1540 04/03/22 2008  AST 14* 11*  ALT 9 10  ALKPHOS 111 104  BILITOT 0.7 0.7  PROT 7.8 6.7  ALBUMIN 1.9* 1.6*      Radiology Studies: No results found.    LOS: 3 days    Flora Lipps, MD [facility] Available via Epic  secure chat 7am-7pm After these hours, please refer to coverage provider listed on [email or web address] 04/05/2022, 1:20 PM

## 2022-04-05 NOTE — Progress Notes (Signed)
[facility] Progress Note   Subjective:   Seen in room, sleeping but awakes to voice. Denies SOB, CP, dizziness and nausea.   Objective Vitals:   04/04/22 1330 04/04/22 1632 04/04/22 2252 04/05/22 0500  BP: (!) 113/44 (!) 135/33 (!) 126/36 96/74  Pulse: 94 87 93 90  Resp: 12 16 16 15   Temp: 97.8 F (36.6 C) 98.3 F (36.8 C) 99.6 F (37.6 C) 98.4 F (36.9 C)  TempSrc: Oral  Oral Oral  SpO2: 100% 100% 100% 100%   Physical Exam General: Sleeping female in NAD Heart: RRR, no murmurs Lungs: CTA bilaterally without wheezing, rhonchi or rales Abdomen: Soft, non-distended, +BS Extremities: No edema b/l lower extremities Dialysis Access: RUE AVF + bruit  Additional Objective Labs: Basic Metabolic Panel: Recent Labs  Lab 04/02/22 1540 04/03/22 2008  NA 136 136  K 4.9 5.4*  CL 94* 97*  CO2 23 19*  GLUCOSE 39* 239*  BUN 66* 80*  CREATININE 10.25* 11.31*  CALCIUM 7.8* 7.0*   Liver Function Tests: Recent Labs  Lab 04/02/22 1540 04/03/22 2008  AST 14* 11*  ALT 9 10  ALKPHOS 111 104  BILITOT 0.7 0.7  PROT 7.8 6.7  ALBUMIN 1.9* 1.6*   No results for input(s): "LIPASE", "AMYLASE" in the last 168 hours. CBC: Recent Labs  Lab 04/02/22 1540 04/03/22 0614  WBC 18.4* 17.0*  NEUTROABS 16.2*  --   HGB 7.7* 6.9*  HCT 26.1* 22.9*  MCV 76.5* 78.4*  PLT 521* 369   Blood Culture    Component Value Date/Time   SDES BLOOD RIGHT HAND 04/03/2022 0635   SPECREQUEST  04/03/2022 0635    BOTTLES DRAWN AEROBIC ONLY Blood Culture adequate volume   CULT  04/03/2022 0635    NO GROWTH 2 DAYS Performed at [facility], [place] [address], [place], [place] [address]    REPTSTATUS PENDING 04/03/2022 [phone]    Cardiac Enzymes: No results for input(s): "CKTOTAL", "CKMB", "CKMBINDEX", "TROPONINI" in the last 168 hours. CBG: Recent Labs  Lab 04/03/22 2059 04/04/22 0722 04/04/22 1630 04/04/22 2306 04/05/22 0720  GLUCAP 205* 312* 201* 136* 246*   Iron  Studies: No results for input(s): "IRON", "TIBC", "TRANSFERRIN", "FERRITIN" in the last 72 hours. @lablastinr3 @ Studies/Results: No results found. Medications:  ceFEPime (MAXIPIME) IV 1 g (04/04/22 2301)   vancomycin Stopped (04/04/22 1349)    sodium chloride   Intravenous Once   atorvastatin  40 mg Per Tube Daily   Chlorhexidine Gluconate Cloth  6 each Topical Q0600   dorzolamide-timolol  1 drop Left Eye BID   insulin aspart  0-5 Units Subcutaneous QHS   insulin aspart  0-6 Units Subcutaneous TID WC   insulin glargine-yfgn  5 Units Subcutaneous BID   midodrine  5 mg Oral TID WC   sodium chloride flush  3 mL Intravenous Q12H    OP HD: [facility] MWF  3h 78min  400/1.5  54.8kg  2/2 bath   P2  Hep none  RUA AVF  - last HD 8/18 - mircera 225 q2, last 8/18 - last Hb 8.2 on 8/14, tsat 11% on 8/14    Assessment/Plan: L foot infection/ cellulitis - on IV abx vanc/ cefepime, MRI showed no osteo.  ESRD - on HD MWF. Missed HD Monday, Continue MWF schedule BP/ vol - BP's controlled, is not on BP lowering meds at home.  Anemia esrd - received 1 unit PRBC yesterday but no repeat Hgb yet- ordered. ESA recently given. No IV Fe due  to acute infection MBD ckd - CCa in range (albumin very low), checking phos H/o CVA - in march 2023 H/o GBS - in march 2023, rx'd w/ TPE PAD sp R AKA - in march 2023    Anice Paganini, PA-C 04/05/2022, 10:44 AM  [facility] Pager: [phone]

## 2022-04-05 NOTE — Plan of Care (Signed)
  Problem: Education: Goal: Knowledge of General Education information will improve Description: Including pain rating scale, medication(s)/side effects and non-pharmacologic comfort measures Outcome: Progressing   Problem: Clinical Measurements: Goal: Ability to maintain clinical measurements within normal limits will improve Outcome: Progressing   Problem: Safety: Goal: Ability to remain free from injury will improve Outcome: Progressing   

## 2022-04-05 NOTE — Progress Notes (Signed)
Initial Nutrition Assessment  DOCUMENTATION CODES:   Non-severe (moderate) malnutrition in context of chronic illness  INTERVENTION:  Liberalize diet from a renal/carb modified to a carb modified diet to provide wider variety of menu options to optimize nutritional intake Nepro Shake po BID, each supplement provides 425 kcal and 19 grams protein (vanilla) Renal MVI with minerals daily  NUTRITION DIAGNOSIS:   Moderate Malnutrition related to chronic illness (ESRD, stroke) as evidenced by mild fat depletion, moderate muscle depletion.  GOAL:   Patient will meet greater than or equal to 90% of their needs  MONITOR:   PO intake, Supplement acceptance, Diet advancement, Labs, Weight trends, I & O's, Skin  REASON FOR ASSESSMENT:   Consult Assessment of nutrition requirement/status  ASSESSMENT:   Pt admitted with cellulitis of L foot no evidence of osteomyelitis. 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   Food allergies: peanut containing drugs, chocolate  Pt lying in bed c/o her bottom hurting and needing to be adjusted in bed. Made RN aware. She states that since moving to a SNF she has been eating 3 meals per day. She still has her PEG tube but has discontinued use as she is eating PO. She enjoys vanilla nutrition supplements and was requesting these during admission as she desires to increase her weight. She reports that she has been eating well during admission.   Meal completions: 8/23: 100%-lunch, 100%-dinner 8/24: 0%-breakfast, 75%-lunch  She did not quantify her EDW but states that she has been losing weight as her clothes have been fitting more loosely. Per review of chart, unfortunately her last documented weight is on 07/01 at 65.9 kg. Will request updated weight to review.   Medications: SSI 0-5 units qhs, SSI 0-6 units TID, semglee 5 units BID IV drips: abx  Labs: potassium 5.4 (H), BUN  80, Cr 11.31, anion gap 20, AST 11, GFR 4, HgbA1c 6.9%, CBG's 136-312 x24 hours  POST HD net UF: 2.9L  I/O's: -17ml since admission  NUTRITION - FOCUSED PHYSICAL EXAM:  Flowsheet Row Most Recent Value  Orbital Region Mild depletion  Upper Arm Region Mild depletion  Thoracic and Lumbar Region Mild depletion  Buccal Region Mild depletion  Temple Region Mild depletion  Clavicle Bone Region Mild depletion  Clavicle and Acromion Bone Region Mild depletion  Scapular Bone Region Moderate depletion  Dorsal Hand Moderate depletion  Patellar Region Severe depletion  [R AKA]  Anterior Thigh Region Moderate depletion  Posterior Calf Region Moderate depletion  Edema (RD Assessment) None  Hair Reviewed  Eyes Reviewed  Mouth Reviewed  Skin Reviewed  Nails Reviewed       Diet Order:   Diet Order             Diet Carb Modified Fluid consistency: Thin; Room service appropriate? Yes; Fluid restriction: 1200 mL Fluid  Diet effective now                   EDUCATION NEEDS:   Not appropriate for education at this time  Skin:  Skin Assessment: Skin Integrity Issues: Skin Integrity Issues:: Stage III Stage III: R knee  Last BM:  8/23 (type 6)  Height:   Ht Readings from Last 1 Encounters:  02/10/22 5\' 1"  (1.549 m)    Weight:   Wt Readings from Last 1 Encounters:  02/10/22 65.9 kg   BMI:  There is no height or weight on  file to calculate BMI.  Estimated Nutritional Needs:   Kcal:  1700-1900  Protein:  85-100g  Fluid:  1L + UOP  Clayborne Dana, RDN, LDN Clinical Nutrition

## 2022-04-06 DIAGNOSIS — E162 Hypoglycemia, unspecified: Secondary | ICD-10-CM | POA: Diagnosis not present

## 2022-04-06 DIAGNOSIS — Q2112 Patent foramen ovale: Secondary | ICD-10-CM | POA: Diagnosis not present

## 2022-04-06 DIAGNOSIS — N186 End stage renal disease: Secondary | ICD-10-CM | POA: Diagnosis not present

## 2022-04-06 DIAGNOSIS — L03116 Cellulitis of left lower limb: Secondary | ICD-10-CM | POA: Diagnosis not present

## 2022-04-06 LAB — CBC
HCT: 27.8 % — ABNORMAL LOW (ref 36.0–46.0)
Hemoglobin: 8.7 g/dL — ABNORMAL LOW (ref 12.0–15.0)
MCH: 24.2 pg — ABNORMAL LOW (ref 26.0–34.0)
MCHC: 31.3 g/dL (ref 30.0–36.0)
MCV: 77.4 fL — ABNORMAL LOW (ref 80.0–100.0)
Platelets: 346 10*3/uL (ref 150–400)
RBC: 3.59 MIL/uL — ABNORMAL LOW (ref 3.87–5.11)
RDW: 19.3 % — ABNORMAL HIGH (ref 11.5–15.5)
WBC: 16 10*3/uL — ABNORMAL HIGH (ref 4.0–10.5)
nRBC: 0.1 % (ref 0.0–0.2)

## 2022-04-06 LAB — BASIC METABOLIC PANEL
Anion gap: 11 (ref 5–15)
BUN: 64 mg/dL — ABNORMAL HIGH (ref 6–20)
CO2: 25 mmol/L (ref 22–32)
Calcium: 8.1 mg/dL — ABNORMAL LOW (ref 8.9–10.3)
Chloride: 98 mmol/L (ref 98–111)
Creatinine, Ser: 8.48 mg/dL — ABNORMAL HIGH (ref 0.44–1.00)
GFR, Estimated: 6 mL/min — ABNORMAL LOW (ref >60)
Glucose, Bld: 248 mg/dL — ABNORMAL HIGH (ref 70–99)
Potassium: 4 mmol/L (ref 3.5–5.1)
Sodium: 134 mmol/L — ABNORMAL LOW (ref 135–145)

## 2022-04-06 LAB — GLUCOSE, CAPILLARY
Glucose-Capillary: 291 mg/dL — ABNORMAL HIGH (ref 70–99)
Glucose-Capillary: 311 mg/dL — ABNORMAL HIGH (ref 70–99)
Glucose-Capillary: 227 mg/dL — ABNORMAL HIGH (ref 70–99)
Glucose-Capillary: 206 mg/dL — ABNORMAL HIGH (ref 70–99)
Glucose-Capillary: 203 mg/dL — ABNORMAL HIGH (ref 70–99)

## 2022-04-06 MED ORDER — OXYCODONE-ACETAMINOPHEN 5-325 MG PO TABS: 1.0000 | ORAL_TABLET | Freq: Four times a day (QID) | ORAL | 0 refills | Status: DC | PRN

## 2022-04-06 MED ORDER — LIDOCAINE HCL (PF) 1 % IJ SOLN: 5.0000 mL | INTRAMUSCULAR | Status: DC | PRN

## 2022-04-06 MED ORDER — CEPHALEXIN 500 MG PO CAPS: 500.0000 mg | ORAL_CAPSULE | ORAL | 0 refills | Status: AC

## 2022-04-06 MED ORDER — CLINDAMYCIN HCL 150 MG PO CAPS: 450.0000 mg | ORAL_CAPSULE | Freq: Three times a day (TID) | ORAL | 0 refills | Status: DC

## 2022-04-06 MED ORDER — METRONIDAZOLE 500 MG PO TABS: 500.0000 mg | ORAL_TABLET | Freq: Three times a day (TID) | ORAL | 0 refills | Status: AC

## 2022-04-06 MED ORDER — LIDOCAINE-PRILOCAINE 2.5-2.5 % EX CREA: 1.0000 | TOPICAL_CREAM | CUTANEOUS | Status: DC | PRN

## 2022-04-06 MED ORDER — PENTAFLUOROPROP-TETRAFLUOROETH EX AERO
INHALATION_SPRAY | CUTANEOUS | Status: AC
  Filled 2022-04-06: qty 30

## 2022-04-06 MED ORDER — PENTAFLUOROPROP-TETRAFLUOROETH EX AERO: 1.0000 | INHALATION_SPRAY | CUTANEOUS | Status: DC | PRN

## 2022-04-06 NOTE — Progress Notes (Signed)
D/C order noted for today. Contacted [facility] to advise clinic that pt will return to snf today and resume care on Monday.   Melven Sartorius Renal Navigator [phone]

## 2022-04-06 NOTE — Progress Notes (Signed)
Subjective:  seen end of HD  with No cos .   Objective Vital signs in last 24 hours: Vitals:   04/05/22 1115 04/05/22 1626 04/05/22 2030 04/06/22 0428  BP: 139/74 (!) 87/38 (!) 138/44 (!) 140/24  Pulse: 92 76 88 74  Resp: 17 20 18 16   Temp: 98.1 F (36.7 C) 99.1 F (37.3 C) 99 F (37.2 C) 98 F (36.7 C)  TempSrc:    Oral  SpO2: 98%  99% 98%   Weight change:   Physical Exam: General: Female chronically ill NAD, Heart: RRR no MRG Lungs: CTA bilaterally Abdomen: NABS, soft NTND, PEG tube present Extremities: Right AKA, left leg with dressing dry clear Dialysis Access: RUA, AV fistula positive bruit  OP HD: [facility] MWF  3h 70min  400/1.5  54.8kg  2/2 bath   P2  Hep none  RUA AVF  - last HD 8/18 - mircera 225 q2, last 8/18 - last Hb 8.2 on 8/14, tsat 11% on 8/14  Problem/Plan: Left foot infection/cellulitis IV antibiotics per admit team, MRI showed no osteomyelitis.  Noted to be discharged and use clindamycin as outpatient ESRD -HD MWF HTN/volume -BP stable volume stable, unfortunately no weights recorded today see Anemia -received 1 unit packed red blood cells 8/23, ESA given 8/18 maximum dose as outpatient, no iron secondary infection Hgb 8.7 Secondary hyperparathyroidism -calcium at goal, phosphorus pending  Ho CVA 2023 at skilled nursing home History of GBS March 2023 received TPE PAD status post right AKA March 2023  Ernest Haber, PA-C [facility] Beeper [phone] 04/06/2022,2:31 PM  LOS: 4 days   Labs: Basic Metabolic Panel: Recent Labs  Lab 04/02/22 1540 04/03/22 2008 04/06/22 0604  NA 136 136 134*  K 4.9 5.4* 4.0  CL 94* 97* 98  CO2 23 19* 25  GLUCOSE 39* 239* 248*  BUN 66* 80* 64*  CREATININE 10.25* 11.31* 8.48*  CALCIUM 7.8* 7.0* 8.1*   Liver Function Tests: Recent Labs  Lab 04/02/22 1540 04/03/22 2008  AST 14* 11*  ALT 9 10  ALKPHOS 111 104  BILITOT 0.7 0.7  PROT 7.8 6.7  ALBUMIN 1.9* 1.6*   No results for input(s):  "LIPASE", "AMYLASE" in the last 168 hours. No results for input(s): "AMMONIA" in the last 168 hours. CBC: Recent Labs  Lab 04/02/22 1540 04/03/22 0614 04/06/22 0604  WBC 18.4* 17.0* 16.0*  NEUTROABS 16.2*  --   --   HGB 7.7* 6.9* 8.7*  HCT 26.1* 22.9* 27.8*  MCV 76.5* 78.4* 77.4*  PLT 521* 369 346   Cardiac Enzymes: No results for input(s): "CKTOTAL", "CKMB", "CKMBINDEX", "TROPONINI" in the last 168 hours. CBG: Recent Labs  Lab 04/05/22 1111 04/05/22 1622 04/05/22 2144 04/06/22 0719 04/06/22 1134  GLUCAP 235* 291* 311* 227* 206*    Studies/Results: No results found. Medications:  cefTRIAXone (ROCEPHIN)  IV 1 g (04/05/22 2148)   vancomycin Stopped (04/04/22 1349)    atorvastatin  40 mg Per Tube Daily   Chlorhexidine Gluconate Cloth  6 each Topical Q0600   Chlorhexidine Gluconate Cloth  6 each Topical Q0600   dorzolamide-timolol  1 drop Left Eye BID   feeding supplement (NEPRO CARB STEADY)  237 mL Oral BID BM   insulin aspart  0-5 Units Subcutaneous QHS   insulin aspart  0-6 Units Subcutaneous TID WC   insulin glargine-yfgn  5 Units Subcutaneous BID   midodrine  5 mg Oral TID WC   multivitamin  1 tablet Oral QHS   sodium chloride flush  3 mL Intravenous Q12H

## 2022-04-06 NOTE — Discharge Summary (Addendum)
Physician Discharge Summary   Patient: [name] MRN: [id number] DOB: 1981-02-11  Admit date:     04/02/2022  Discharge date: 04/06/22  Discharge Physician: Flora Lipps   PCP: [facility]   Recommendations at discharge:   Follow-up with your primary care provider at the skilled nursing facility in 3 to 5 days. Check CBC, BMP, magnesium in the next visit.  Discharge Diagnoses: Principal Problem:   Cellulitis of left foot Active Problems:   ESRD on hemodialysis (HCC)   PFO (patent foramen ovale)   Cellulitis   Hypoglycemia  Resolved Problems:   * No resolved hospital problems. *  Hospital Course:   [name] is a 41 y.o. female with history of ESRD on Monday, Wednesday, Friday and recent admission in March of this year for stroke with PFO and GBS [name] variant was brought into the hospital after patient's left foot was noticed an ulcer which had maggots and signs of infection.  In the ED, there was some bloody discharge from the wound.    X-rays of the foot do not show any signs of bony involvement.  Pulses on the left foot was dopplerable.  The right foot stump did not look infected. In the ED, patient was febrile with a temperature 101.1 F.  Initial labs showed a WBC at 18.4 with hemoglobin of 7.7.  Patient also has significant drop in hemoglobin from 11.7 about a month ago it is around 7.7.  Patient was then admitted to the  hospital for further evaluation and treatment.  Following conditions were addressed during hospitalization,  Cellulitis of the left foot with ulceration  Patient had leukocytosis on presentation with left leg cellulitis.  MRI of the left foot was done without any evidence of osteomyelitis.  Wound care was consulted and patient was empirically treated with IV antibiotics during hospitalization.  At this time patient will continue clindamycin on discharge to complete the course.  Recommend following up CBC as  outpatient.   Hypoglycemia with history of diabetes mellitus type 2 - Latest hemoglobin A1c around 2 months back was 5.7.  Patient does have PEG tube though which patient states she rarely uses now.  Hemoglobin A1c at 6.9 at this time.  We will continue with sliding scale insulin while in the hospital.  Patient is on insulin regimen at the skilled nursing facility.   Acute blood loss anemia suspected  from the wound.  No other site of bleeding reported.  Status post 1 unit of unit of packed RBC transfusion with hemodialysis on 04/04/2022.  CBC from today at 8.7.   History of recent stroke with PFO  Hold off with antiplatelets due to significant hemoglobin drop to 6.9.  Continue Lipitor.  Check CBC again.   History of GBS with [name] variant.   Was treated with plasmapheresis early part of this year.  Currently at skilled nursing facility.  Continue rehabilitation.   Prior history of right AKA.  Stump does not show any signs of infection.  Continue local dressing   Severe protein calorie malnutrition Present on admission.  Would encourage improved intake.  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   Transient elevated blood pressure.  Could be from pain and anxiety.  Closely monitor.  Not on antihypertensives as outpatient   ESRD on hemodialysis Monday Wednesday Friday.  Nephrology has been consulted for hemodialysis needs.  Consultants: Nephrology  Procedures performed:  Transfusion of 1 unit packed RBC,  Hemodialysis  Disposition: Skilled nursing facility Diet recommendation:  Discharge Diet Orders (From admission, onward)     Start     Ordered   04/06/22 0000  Diet Carb Modified        04/06/22 0817           Cardiac diet DISCHARGE MEDICATION: Allergies as of 04/06/2022       Reactions   Morphine Rash, Other (See Comments)    Peanut-containing Drug Products Anaphylaxis, Hives   Penicillins Rash, Other (See Comments)   Rash in 2008.  Tolerated cefazolin in 2020   Chocolate Hives        Medication List     STOP taking these medications    calcitRIOL 0.25 MCG capsule Commonly known as: ROCALTROL       TAKE these medications    Accu-Chek FastClix Lancets Misc Use as instructed to check blood sugar up to TID. E11.22 What changed:  how much to take how to take this when to take this   Accu-Chek Guide Me w/Device Kit 1 kit by Does not apply route 3 (three) times daily. Use to check BG at home up to 3 times daily. E11.22   Accu-Chek Guide test strip Generic drug: glucose blood Use as instructed to check blood sugar up to TID. E11.22 What changed:  how much to take how to take this when to take this   atorvastatin 40 MG tablet Commonly known as: LIPITOR Place 1 tablet (40 mg total) into feeding tube daily. What changed: how to take this   cephALEXin 500 MG capsule Commonly known as: KEFLEX Take 1 capsule (500 mg total) by mouth every other day for 3 doses. After hemodialysis for 3 doses only   cinacalcet 90 MG tablet Commonly known as: SENSIPAR Take 90 mg by mouth daily.   Dexcom G6 Transmitter Misc 1 Device by Does not apply route as directed.   dorzolamide-timolol 22.3-6.8 MG/ML ophthalmic solution Commonly known as: COSOPT Place 1 drop into the left eye 2 (two) times daily.   Nepro Liqd Place 60 mL/hr into feeding tube See admin instructions. 60 ml's/hr, per tube- Start infusion at 1600 and stop at 0800. 16 hours total every shift for nutritional support. Administer 60 ml's/hr via G-Tube continuously with a 30 ml's flush every 2 hours for 16 hours.   feeding supplement (NEPRO CARB STEADY) Liqd Place 960 mLs into feeding tube daily.   feeding supplement (PROSource TF) liquid Place 45 mLs into feeding tube 2 (two) times daily.   nutrition supplement (JUVEN) Pack Place 1 packet  into feeding tube 2 (two) times daily between meals.   free water Soln Place 30 mLs into feeding tube every 4 (four) hours.   insulin aspart 100 UNIT/ML injection Commonly known as: novoLOG Inject 0-6 Units into the skin every 4 (four) hours.   insulin glargine-yfgn 100 UNIT/ML Pen Commonly known as: SEMGLEE Inject 5 Units into the skin 2 (two) times daily.   Insulin Pen Needle 32G X 4 MM Misc 1 Device by Does not apply route as directed.   metFORMIN 500 MG tablet Commonly known as: GLUCOPHAGE Take 500 mg by mouth daily.   metroNIDAZOLE 500 MG tablet Commonly known as: Flagyl Take 1 tablet (500 mg total) by mouth 3 (three) times daily for 7 days.   midodrine 5 MG tablet Commonly known as: PROAMATINE Take 5 mg by mouth 3 (three) times daily.  multivitamin with minerals Tabs tablet Take 1 tablet by mouth every morning.   NON FORMULARY 30 mLs See admin instructions. ProHeal- 30 ml's, per tube, once a day   nystatin 100000 UNIT/ML suspension Commonly known as: MYCOSTATIN Take 5 mLs (500,000 Units total) by mouth 4 (four) times daily.   ondansetron 4 MG tablet Commonly known as: ZOFRAN Take 4 mg by mouth every 6 (six) hours as needed for nausea/vomiting.   oxyCODONE-acetaminophen 5-325 MG tablet Commonly known as: Percocet Take 1 tablet by mouth every 6 (six) hours as needed (for pain).   Retacrit 10000 UNIT/ML injection Generic drug: epoetin alfa-epbx Inject 10,000 Units into the vein See admin instructions. 10,000 units IM every Mon/Wed/Fri with dialysis   Santyl 250 UNIT/GM ointment Generic drug: collagenase Apply 1 Application topically See admin instructions. Apply to right above-the-knee amputation topically every day shift- for wound   sennosides 8.8 MG/5ML syrup Commonly known as: SENOKOT Place 5 mLs into feeding tube 2 (two) times daily.   thiamine 100 MG tablet Commonly known as: VITAMIN B1 Place 1 tablet (100 mg total) into feeding tube daily.                Discharge Care Instructions  (From admission, onward)           Start     Ordered   04/06/22 0000  Discharge wound care:       Comments: Wound care to left heel Unstageable pressure injury:  Wash foot with soap and water, rinse and dry. Place a betadine moistened gauze over the wound, top with dry gauze 4x4s and secure with Kerlix roll gauze/paper tape. Place foot into [employer]. Perform twice daily.   04/06/22 0817            Contact information for after-discharge care     Destination     [facility] Preferred SNF .   Service: Skilled Nursing Contact information: [place] [phone]                    Subjective Today, patient was seen and examined at bedside.  Feels okay.  Denies nausea vomiting fever chills or rigor.  Wants to go back to her facility.  Discharge Exam:    04/06/2022    2:55 PM 04/06/2022    2:40 PM 04/06/2022    4:28 AM  Vitals with BMI  Systolic 401 027 253  Diastolic 51 58 24  Pulse 72 75 74    General:  Average built, not in obvious distress, legally blind HENT:   No scleral pallor or icterus noted. Oral mucosa is moist.  Chest:    Diminished breath sounds bilaterally. No crackles or wheezes.  CVS: S1 &S2 heard. No murmur.  Regular rate and rhythm. Abdomen: Soft, nontender, nondistended.  Bowel sounds are heard.  PEG tube in place without induration Extremities: Right above-knee amputation, left leg cellulitis/ulceration with dressing. Psych: Alert, awake and oriented, normal mood CNS:  No cranial nerve deficits.  Power equal in all extremities.   Skin: Left foot cellulitis with dressing   Condition at discharge: good  The results of significant diagnostics from this hospitalization (including imaging, microbiology, ancillary and laboratory) are listed below for reference.   Imaging Studies: VAS [place] UPPER EXTREMITY VENOUS DUPLEX  Result Date:  04/03/2022 UPPER VENOUS STUDY  Patient Name:  [name]  Date of Exam:   04/03/2022 Medical Rec #: [id number]  Accession #:    [id number] Date of Birth: 09-18-1980       Patient Gender: F Patient Age:   41 years Exam Location:  [facility] Procedure:      VAS [place] UPPER EXTREMITY VENOUS DUPLEX Referring Phys: Gean Birchwood --------------------------------------------------------------------------------  Indications: Edema Comparison Study: Previous exam dated 10/28/21 negative for DVT. Performing Technologist: Bobetta Lime BS, RVT  Examination Guidelines: A complete evaluation includes B-mode imaging, spectral Doppler, color Doppler, and power Doppler as needed of all accessible portions of each vessel. Bilateral testing is considered an integral part of a complete examination. Limited examinations for reoccurring indications may be performed as noted.  Right Findings: +----------+------------+---------+-----------+----------+-------+ RIGHT     CompressiblePhasicitySpontaneousPropertiesSummary +----------+------------+---------+-----------+----------+-------+ IJV         Partial      Yes       Yes              Chronic +----------+------------+---------+-----------+----------+-------+ Subclavian  Partial      Yes       Yes              Chronic +----------+------------+---------+-----------+----------+-------+ Axillary    Partial      Yes       Yes              Chronic +----------+------------+---------+-----------+----------+-------+ Brachial      Full                                          +----------+------------+---------+-----------+----------+-------+ Radial        Full                                          +----------+------------+---------+-----------+----------+-------+ Ulnar         Full                                          +----------+------------+---------+-----------+----------+-------+ Cephalic                           Yes                       +----------+------------+---------+-----------+----------+-------+ Basilic       Full                                          +----------+------------+---------+-----------+----------+-------+ Right cepahlic vein (outflow vein for AVF) with calcification throughout and possible stenosis at the origin by color imaging.  Left Findings: +----------+------------+---------+-----------+----------+-------+ LEFT      CompressiblePhasicitySpontaneousPropertiesSummary +----------+------------+---------+-----------+----------+-------+ Subclavian    Full       Yes       Yes                      +----------+------------+---------+-----------+----------+-------+  Summary:  Right: No evidence of deep vein thrombosis in the upper extremity. No evidence of superficial vein thrombosis in the upper extremity. 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 Right brachio-cephalic AVF. Possible stenosis at  the inflow vein origin (cephalic confluence at the subclavian).  *See table(s) above for measurements and observations.  Diagnosing physician: Servando Snare MD Electronically signed by Servando Snare MD on 04/03/2022 at 2:11:18 PM.    Final    MR FOOT LEFT WO CONTRAST  Result Date: 04/03/2022 CLINICAL DATA:  Diabetic foot swelling.  Wound infection EXAM: MRI OF THE LEFT FOOT WITHOUT CONTRAST TECHNIQUE: Multiplanar, multisequence MR imaging of the left forefoot was performed. No intravenous contrast was administered. COMPARISON:  04/02/2022 x-ray FINDINGS: Bones/Joint/Cartilage Postsurgical changes from second ray resection at the level of the distal second metatarsal diaphysis. Well corticated resection margin without associated marrow signal abnormality. Hallux valgus deformity with moderate osteoarthritis of the first MTP joint. Mild osteoarthritis throughout the remaining joints of the forefoot.  Mild degenerative subchondral marrow signal changes at the first and third MTP joints and IP joint of the great toe. No joint effusion. No erosion or periostitis. No marrow replacement. Ligaments Intact Lisfranc ligament.  First MTP joint capsular thickening. Muscles and Tendons Denervation changes of the foot musculature. No tenosynovial fluid collection. Soft tissues Generalized soft tissue swelling and edema, most pronounced dorsally. No organized fluid collection. No deep soft tissue ulceration. IMPRESSION: 1. No evidence of osteomyelitis of the left forefoot. 2. Hallux valgus deformity with moderate osteoarthritis of the first MTP joint. 3. Postsurgical changes from second ray resection. 4. Generalized soft tissue swelling and edema, most pronounced dorsally. No organized fluid collection. Electronically Signed   By: Davina Poke D.O.   On: 04/03/2022 08:12   DG Foot Complete Left  Result Date: 04/02/2022 CLINICAL DATA:  Wound/infection of the left foot. Calcaneal osteomyelitis? EXAM: LEFT FOOT - COMPLETE 3+ VIEW COMPARISON:  None Available. FINDINGS: Postsurgical changes of prior surgical resection through the neck of the second metatarsal. Hallux valgus deformity with degenerative changes at the first metatarsophalangeal joint. No cortical erosion or periosteal reaction diffuse osteopenia however limits evaluation. Prominent vascular calcifications. Skin irregularity about the calcaneus suggesting skin ulcer. IMPRESSION: 1. Postsurgical changes from prior surgical resection to the second metatarsal neck. 2. No cortical erosion or periosteal reaction to suggest osteomyelitis, diffuse osteopenia however limits evaluation. If there is clinical concern for osteomyelitis, further evaluation with MR examination would be helpful. Electronically Signed   By: Keane Police D.O.   On: 04/02/2022 11:05    Microbiology: Results for orders placed or performed during the hospital encounter of 04/02/22   Aerobic/Anaerobic Culture w Gram Stain (surgical/deep wound)     Status: None   Collection Time: 04/02/22 12:03 PM   Specimen: Foot; Wound  Result Value Ref Range Status   Specimen Description FOOT  Final   Special Requests Normal  Final   Gram Stain   Final    RARE WBC PRESENT, PREDOMINANTLY MONONUCLEAR RARE GRAM POSITIVE COCCI IN PAIRS    Culture   Final    FEW PROTEUS MIRABILIS ABUNDANT BACTEROIDES FRAGILIS BETA LACTAMASE POSITIVE Performed at [facility], [place] [address], [place], [place] [address]    Report Status 04/05/2022 FINAL  Final   Organism ID, Bacteria PROTEUS MIRABILIS  Final      Susceptibility   Proteus mirabilis - MIC*    AMPICILLIN >=32 RESISTANT Resistant     CEFAZOLIN 8 SENSITIVE Sensitive     CEFEPIME <=0.12 SENSITIVE Sensitive     CEFTAZIDIME <=1 SENSITIVE Sensitive     CEFTRIAXONE <=0.25 SENSITIVE Sensitive     CIPROFLOXACIN >=4 RESISTANT Resistant     GENTAMICIN <=1 SENSITIVE Sensitive  IMIPENEM 2 SENSITIVE Sensitive     TRIMETH/SULFA >=320 RESISTANT Resistant     AMPICILLIN/SULBACTAM 8 SENSITIVE Sensitive     PIP/TAZO <=4 SENSITIVE Sensitive     * FEW PROTEUS MIRABILIS  Blood culture (routine x 2)     Status: None (Preliminary result)   Collection Time: 04/03/22  6:35 AM   Specimen: BLOOD RIGHT HAND  Result Value Ref Range Status   Specimen Description BLOOD RIGHT HAND  Final   Special Requests   Final    BOTTLES DRAWN AEROBIC ONLY Blood Culture adequate volume   Culture   Final    NO GROWTH 3 DAYS Performed at [facility], [address], [place], [place] [address]    Report Status PENDING  Incomplete    Labs: CBC: Recent Labs  Lab 04/02/22 1540 04/03/22 0614 04/06/22 0604  WBC 18.4* 17.0* 16.0*  NEUTROABS 16.2*  --   --   HGB 7.7* 6.9* 8.7*  HCT 26.1* 22.9* 27.8*  MCV 76.5* 78.4* 77.4*  PLT 521* 369 767   Basic Metabolic Panel: Recent Labs  Lab 04/02/22 1540 04/03/22 2008 04/06/22 0604  NA 136 136 134*  K  4.9 5.4* 4.0  CL 94* 97* 98  CO2 23 19* 25  GLUCOSE 39* 239* 248*  BUN 66* 80* 64*  CREATININE 10.25* 11.31* 8.48*  CALCIUM 7.8* 7.0* 8.1*   Liver Function Tests: Recent Labs  Lab 04/02/22 1540 04/03/22 2008  AST 14* 11*  ALT 9 10  ALKPHOS 111 104  BILITOT 0.7 0.7  PROT 7.8 6.7  ALBUMIN 1.9* 1.6*   CBG: Recent Labs  Lab 04/05/22 1111 04/05/22 1622 04/05/22 2144 04/06/22 0719 04/06/22 1134  GLUCAP 235* 291* 311* 227* 206*    Discharge time spent: greater than 30 minutes.  Signed: Flora Lipps, MD [facility] 04/06/2022

## 2022-04-06 NOTE — Progress Notes (Addendum)
Pt being discharged at this time, [facility] was running behind. Discharge instructions went over with patient. Discharge packet sent via [facility]. Pt transporting via [facility] back to [facility]. IV removed. Pt is stable and ready for discharge.   [name] [name]

## 2022-04-06 NOTE — TOC Transition Note (Signed)
Transition of Care [facility]) - CM/SW Discharge Note   Patient Details  Name: [name] MRN: [id number] Date of Birth: 1981/06/27  Transition of Care [facility]) CM/SW Contact:  Geralynn Ochs, LCSW Phone Number: 04/06/2022, 3:34 PM   Clinical Narrative:   CSW confirmed with MD that patient can transition back to SNF after HD is complete. CSW coordinated with RN about HD time and ensured that SNF can accept patient late since patient was not taken for HD this morning. SNF can accept patient at any time since she is LTC resident. Transport to be scheduled with [facility] for when patient is done with dialysis.  Nurse to call report to [phone].    Final next level of care: Skilled Nursing Facility Barriers to Discharge: Barriers Resolved   Patient Goals and CMS Choice Patient states their goals for this hospitalization and ongoing recovery are:: to get back home CMS Medicare.gov Compare Post Acute Care list provided to:: Patient Choice offered to / list presented to : Patient  Discharge Placement              Patient chooses bed at:  [facility]) Patient to be transferred to facility by: [place] Name of family member notified: Self Patient and family notified of of transfer: 04/06/22  Discharge Plan and Services                                     Social Determinants of Health (SDOH) Interventions     Readmission Risk Interventions    08/23/2021    3:42 PM  Readmission Risk Prevention Plan  Transportation Screening Complete  HRI or Home Care Consult Complete  Social Work Consult for [place] Planning/Counseling Complete  Palliative Care Screening Not Applicable  Medication Review [employer]) Complete
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
## 2022-04-08 LAB — CULTURE, BLOOD (ROUTINE X 2)
Culture: NO GROWTH
Special Requests: ADEQUATE

## 2022-04-12 ENCOUNTER — Emergency Department (HOSPITAL_COMMUNITY)
Admission: EM | Admit: 2022-04-12 | Discharge: 2022-04-12 | Disposition: A | Discharge status: Skilled Nursing Facility | Payer: Medicaid Other | Attending: Emergency Medicine | Admitting: Emergency Medicine

## 2022-04-12 ENCOUNTER — Other Ambulatory Visit: Payer: Self-pay

## 2022-04-12 ENCOUNTER — Encounter (HOSPITAL_COMMUNITY): Payer: Self-pay

## 2022-04-12 DIAGNOSIS — Z794 Long term (current) use of insulin: Secondary | ICD-10-CM | POA: Insufficient documentation

## 2022-04-12 DIAGNOSIS — S91302D Unspecified open wound, left foot, subsequent encounter: Secondary | ICD-10-CM | POA: Insufficient documentation

## 2022-04-12 DIAGNOSIS — X58XXXD Exposure to other specified factors, subsequent encounter: Secondary | ICD-10-CM | POA: Diagnosis not present

## 2022-04-12 DIAGNOSIS — D72829 Elevated white blood cell count, unspecified: Secondary | ICD-10-CM | POA: Insufficient documentation

## 2022-04-12 DIAGNOSIS — Z91010 Allergy to peanuts: Secondary | ICD-10-CM | POA: Insufficient documentation

## 2022-04-12 DIAGNOSIS — Z992 Dependence on renal dialysis: Secondary | ICD-10-CM | POA: Diagnosis not present

## 2022-04-12 DIAGNOSIS — S99922D Unspecified injury of left foot, subsequent encounter: Secondary | ICD-10-CM | POA: Diagnosis present

## 2022-04-12 DIAGNOSIS — N186 End stage renal disease: Secondary | ICD-10-CM | POA: Diagnosis not present

## 2022-04-12 LAB — COMPREHENSIVE METABOLIC PANEL
ALT: 10 U/L (ref 0–44)
AST: 17 U/L (ref 15–41)
Albumin: 2 g/dL — ABNORMAL LOW (ref 3.5–5.0)
Alkaline Phosphatase: 85 U/L (ref 38–126)
Anion gap: 13 (ref 5–15)
BUN: 21 mg/dL — ABNORMAL HIGH (ref 6–20)
CO2: 30 mmol/L (ref 22–32)
Calcium: 7.5 mg/dL — ABNORMAL LOW (ref 8.9–10.3)
Chloride: 96 mmol/L — ABNORMAL LOW (ref 98–111)
Creatinine, Ser: 5.12 mg/dL — ABNORMAL HIGH (ref 0.44–1.00)
GFR, Estimated: 10 mL/min — ABNORMAL LOW (ref >60)
Glucose, Bld: 129 mg/dL — ABNORMAL HIGH (ref 70–99)
Potassium: 3.8 mmol/L (ref 3.5–5.1)
Sodium: 139 mmol/L (ref 135–145)
Total Bilirubin: 0.3 mg/dL (ref 0.3–1.2)
Total Protein: 7.6 g/dL (ref 6.5–8.1)

## 2022-04-12 LAB — C-REACTIVE PROTEIN: CRP: 17.3 mg/dL — ABNORMAL HIGH (ref <1.0)

## 2022-04-12 LAB — SEDIMENTATION RATE: Sed Rate: 72 mm/hr — ABNORMAL HIGH (ref 0–22)

## 2022-04-12 LAB — CBC WITH DIFFERENTIAL/PLATELET
Abs Immature Granulocytes: 0.1 10*3/uL — ABNORMAL HIGH (ref 0.00–0.07)
Basophils Absolute: 0.1 10*3/uL (ref 0.0–0.1)
Basophils Relative: 0 %
Eosinophils Absolute: 0.6 10*3/uL — ABNORMAL HIGH (ref 0.0–0.5)
Eosinophils Relative: 4 %
HCT: 33.9 % — ABNORMAL LOW (ref 36.0–46.0)
Hemoglobin: 9.9 g/dL — ABNORMAL LOW (ref 12.0–15.0)
Immature Granulocytes: 1 %
Lymphocytes Relative: 12 %
Lymphs Abs: 1.9 10*3/uL (ref 0.7–4.0)
MCH: 23.7 pg — ABNORMAL LOW (ref 26.0–34.0)
MCHC: 29.2 g/dL — ABNORMAL LOW (ref 30.0–36.0)
MCV: 81.3 fL (ref 80.0–100.0)
Monocytes Absolute: 0.7 10*3/uL (ref 0.1–1.0)
Monocytes Relative: 5 %
Neutro Abs: 12.2 10*3/uL — ABNORMAL HIGH (ref 1.7–7.7)
Neutrophils Relative %: 78 %
Platelets: 359 10*3/uL (ref 150–400)
RBC: 4.17 MIL/uL (ref 3.87–5.11)
RDW: 20.5 % — ABNORMAL HIGH (ref 11.5–15.5)
WBC: 15.6 10*3/uL — ABNORMAL HIGH (ref 4.0–10.5)
nRBC: 0 % (ref 0.0–0.2)

## 2022-04-12 MED ORDER — ACETAMINOPHEN 500 MG PO TABS
1000.0000 mg | ORAL_TABLET | Freq: Once | ORAL | Status: DC
  Filled 2022-04-12: qty 2

## 2022-04-12 NOTE — ED Notes (Signed)
Attempted report back to [facility] x2, no answer at this time.

## 2022-04-12 NOTE — ED Notes (Signed)
Pt refusing to allow phlebotomist to stick for labs. Pt only wants to be stuck in the R hand, pt is R arm restricted due to dialysis. MD Vanita Panda made aware.

## 2022-04-12 NOTE — ED Notes (Signed)
Refused and IV at this time. Pt screams in pain while laying on bed. Continues to refuse an IV. Will notify MD.

## 2022-04-12 NOTE — ED Notes (Signed)
Report given to PTAR. Discharge instructions discussed with pt, pt states understanding. Pt to be transported back to [facility] at this time.

## 2022-04-12 NOTE — ED Provider Notes (Signed)
[facility] EMERGENCY DEPARTMENT Provider Note   CSN: [id number] Arrival date & time: 04/12/22  1313     History  Chief Complaint  Patient presents with   Wound Infection    [name] is a 41 y.o. female. Presenting from rehab facility do to concern for left foot wound. Recent d/c from hospital for foot infection 6 days ago. Patient not cooperative with additional history taking. Denies fever or pain at rest in foot. She is unsure if she is currently on antibiotics. She did receive full dialysis session yesterday. Denies chest pain or shortness of breath.  HPI     Home Medications Prior to Admission medications   Medication Sig Start Date End Date Taking? Authorizing Provider  Accu-Chek FastClix Lancets MISC Use as instructed to check blood sugar up to TID. E11.22 Patient taking differently: 1 each by Other route See admin instructions. Use as instructed to check blood sugar up to TID. E11.22 04/15/19   Charlott Rakes, MD  atorvastatin (LIPITOR) 40 MG tablet Place 1 tablet (40 mg total) into feeding tube daily. Patient taking differently: Take 40 mg by mouth daily. 10/26/21   Shelly Coss, MD  Blood Glucose Monitoring Suppl (ACCU-CHEK GUIDE ME) w/Device KIT 1 kit by Does not apply route 3 (three) times daily. Use to check BG at home up to 3 times daily. E11.22 04/15/19   Charlott Rakes, MD  cinacalcet (SENSIPAR) 90 MG tablet Take 90 mg by mouth daily. 09/22/19   [provider]  Continuous Blood Gluc Transmit (DEXCOM G6 TRANSMITTER) MISC 1 Device by Does not apply route as directed. 09/12/20   Shamleffer, Melanie Crazier, MD  dorzolamide-timolol (COSOPT) 22.3-6.8 MG/ML ophthalmic solution Place 1 drop into the left eye 2 (two) times daily. 03/30/21   [provider]  epoetin alfa-epbx (RETACRIT) 21115 UNIT/ML injection Inject 10,000 Units into the vein See admin instructions. 10,000 units IM every Mon/Wed/Fri with dialysis    [provider]  glucose blood (ACCU-CHEK GUIDE) test strip Use as instructed to check blood sugar up to TID. E11.22 Patient taking differently: 1 each by Other route See admin instructions. Use as instructed to check blood sugar up to TID. E11.22 07/17/19   Fulp, Cammie, MD  insulin aspart (NOVOLOG) 100 UNIT/ML injection Inject 0-6 Units into the skin every 4 (four) hours. Patient not taking: Reported on 04/03/2022 10/25/21   Shelly Coss, MD  insulin glargine-yfgn (SEMGLEE) 100 UNIT/ML Pen Inject 5 Units into the skin 2 (two) times daily. 03/15/22   [provider]  Insulin Pen Needle 32G X 4 MM MISC 1 Device by Does not apply route as directed. 05/13/20   Shamleffer, Melanie Crazier, MD  metFORMIN (GLUCOPHAGE) 500 MG tablet Take 500 mg by mouth daily. 03/31/22   [provider]  metroNIDAZOLE (FLAGYL) 500 MG tablet Take 1 tablet (500 mg total) by mouth 3 (three) times daily for 7 days. 04/06/22 04/13/22  Pokhrel, Corrie Mckusick, MD  midodrine (PROAMATINE) 5 MG tablet Take 5 mg by mouth 3 (three) times daily. 03/03/22   [provider]  Multiple Vitamin (MULTIVITAMIN WITH MINERALS) TABS tablet Take 1 tablet by mouth every morning.    [provider]  NON FORMULARY 30 mLs See admin instructions. ProHeal- 30 ml's, per tube, once a day Patient not taking: Reported on 04/03/2022    [provider]  nutrition supplement, JUVEN, (JUVEN) PACK Place 1 packet into feeding tube 2 (two) times daily between meals. Patient not taking: Reported on 04/03/2022  10/25/21   Shelly Coss, MD  Nutritional Supplements (FEEDING SUPPLEMENT, NEPRO CARB STEADY,) LIQD Place 960 mLs into feeding tube daily. Patient not taking: Reported on 04/03/2022 10/25/21   Shelly Coss, MD  Nutritional Supplements (FEEDING SUPPLEMENT, PROSOURCE TF,) liquid Place 45 mLs into feeding tube 2 (two) times daily. Patient not taking: Reported on 04/03/2022 10/25/21   Shelly Coss, MD  Nutritional Supplements (NEPRO) LIQD  Place 60 mL/hr into feeding tube See admin instructions. 60 ml's/hr, per tube- Start infusion at 1600 and stop at 0800. 16 hours total every shift for nutritional support. Administer 60 ml's/hr via G-Tube continuously with a 30 ml's flush every 2 hours for 16 hours. Patient not taking: Reported on 04/03/2022    [provider]  nystatin (MYCOSTATIN) 100000 UNIT/ML suspension Take 5 mLs (500,000 Units total) by mouth 4 (four) times daily. 10/25/21   Shelly Coss, MD  ondansetron (ZOFRAN) 4 MG tablet Take 4 mg by mouth every 6 (six) hours as needed for nausea/vomiting. 03/02/22   [provider]  oxyCODONE-acetaminophen (PERCOCET) 5-325 MG tablet Take 1 tablet by mouth every 6 (six) hours as needed (for pain). 04/06/22 04/06/23  Pokhrel, Corrie Mckusick, MD  SANTYL 250 UNIT/GM ointment Apply 1 Application topically See admin instructions. Apply to right above-the-knee amputation topically every day shift- for wound    [provider]  sennosides (SENOKOT) 8.8 MG/5ML syrup Place 5 mLs into feeding tube 2 (two) times daily. 10/25/21   Shelly Coss, MD  thiamine 100 MG tablet Place 1 tablet (100 mg total) into feeding tube daily. 10/26/21   Shelly Coss, MD  Water For Irrigation, Sterile (FREE WATER) SOLN Place 30 mLs into feeding tube every 4 (four) hours. 10/25/21   Shelly Coss, MD      Allergies    Morphine, Peanut-containing drug products, Penicillins, and Chocolate    Review of Systems   Review of Systems  Constitutional:  Negative for chills and fever.  HENT:  Negative for ear pain and sore throat.   Eyes:  Negative for pain and visual disturbance.  Respiratory:  Negative for cough and shortness of breath.   Cardiovascular:  Negative for chest pain and palpitations.  Gastrointestinal:  Negative for abdominal pain and vomiting.  Genitourinary:  Negative for dysuria and hematuria.  Musculoskeletal:  Negative for arthralgias and back pain.  Skin:  Positive for wound (left  heel). Negative for color change and rash.  Neurological:  Negative for seizures and syncope.  All other systems reviewed and are negative.   Physical Exam Updated Vital Signs BP (!) 143/93 (BP Location: Right Arm)   Pulse 70   Temp 98.1 F (36.7 C) (Oral)   Resp 15   SpO2 99%  Physical Exam Vitals and nursing note reviewed.  Constitutional:      General: She is not in acute distress.    Appearance: She is well-developed.  HENT:     Head: Normocephalic and atraumatic.  Eyes:     Conjunctiva/sclera: Conjunctivae normal.  Cardiovascular:     Rate and Rhythm: Normal rate and regular rhythm.     Heart sounds: No murmur heard. Pulmonary:     Effort: Pulmonary effort is normal. No respiratory distress.     Breath sounds: Normal breath sounds.  Abdominal:     Palpations: Abdomen is soft.     Tenderness: There is no abdominal tenderness.     Comments: G tube in place to left abdomen  Musculoskeletal:        General: No  swelling.     Cervical back: Neck supple.  Skin:    General: Skin is warm and dry.     Capillary Refill: Capillary refill takes less than 2 seconds.     Findings: Lesion present.     Comments: LLE: Left heel wound with necrosis, no active bleeding, minimal foul swelling thin drainage Thready DP pulse Decreased sensation throughout No dressings in place  RLE: remote AKA  Neurological:     Mental Status: She is alert.  Psychiatric:        Behavior: Behavior is uncooperative. Behavior is not agitated, aggressive or combative.          ED Results / Procedures / Treatments   Labs (all labs ordered are listed, but only abnormal results are displayed) Labs Reviewed  CBC WITH DIFFERENTIAL/PLATELET - Abnormal; Notable for the following components:      Result Value   WBC 15.6 (*)    Hemoglobin 9.9 (*)    HCT 33.9 (*)    MCH 23.7 (*)    MCHC 29.2 (*)    RDW 20.5 (*)    Neutro Abs 12.2 (*)    Eosinophils Absolute 0.6 (*)    Abs Immature Granulocytes  0.10 (*)    All other components within normal limits  COMPREHENSIVE METABOLIC PANEL - Abnormal; Notable for the following components:   Chloride 96 (*)    Glucose, Bld 129 (*)    BUN 21 (*)    Creatinine, Ser 5.12 (*)    Calcium 7.5 (*)    Albumin 2.0 (*)    GFR, Estimated 10 (*)    All other components within normal limits  C-REACTIVE PROTEIN - Abnormal; Notable for the following components:   CRP 17.3 (*)    All other components within normal limits  SEDIMENTATION RATE - Abnormal; Notable for the following components:   Sed Rate 72 (*)    All other components within normal limits    EKG None  Radiology No results found.  Procedures Procedures    Medications Ordered in ED Medications - No data to display  ED Course/ Medical Decision Making/ A&P                           Medical Decision Making Amount and/or Complexity of Data Reviewed Labs: ordered.    41 year old female with PMH ESRD on HD MWF, right AKA remote, stroke with PFO earlier this year, GBS [name] presenting from rehab facility due to concerns for left heel wound.   Per chart review, patient was admitted for this left foot wound and discharged 04/06/22 (6 days ago). She was admitted for 4 days. She received imaging, including MRI that did not show osteomyelitis. She was treated with IV antibiotics and discharged on po clindamycin back to rehab facility.   Differential diagnosis includes: cellulitis, chronic wound, osteomyelitis, sepsis  Today, vitals signs stable, afebrile, not consistent with sepsis.  In comparison to prior photos in media tab, wound does not appear to be worsening. Plan for labs to monitor leukocytosis. Also will obtain inflammatory markers for monitoring. Based on discharge summary, patient likely still on clindamycin however she is unable to confirm, but states she takes was is administered to her at rehab facility.   Labs overall reassuring.  Cr similar to baseline,  ESRD. She does have a leukocytosis of 15.6, however this is continuing to downtrend from her hospitalization.  Inflammatory markers mildly elevated, and  can continue to be trended in the outpatient basis.  She is afebrile and not tachycardic.  Overall, do not feel patient has an acutely worsening infection at this time.  Recommended continuing outpatient antibiotics and close follow-up with wound care.  Patient is agreeable to this plan.  Strict return precautions given.        Final Clinical Impression(s) / ED Diagnoses Final diagnoses:  Open wound of left foot excluding toes without complication, subsequent encounter    Rx / DC Orders ED Discharge Orders     None         Rosine Abe, MD 04/12/22 2033    Carmin Muskrat, MD 04/12/22 2224

## 2022-04-12 NOTE — ED Triage Notes (Signed)
Weeping wound to left foot. Hx of right AKA. No fever. MWF dialysis.

## 2022-04-12 NOTE — ED Notes (Signed)
Pt refused to have labs drawn she wanted this tech to draw out of the restricted arm this tech informed her that it was not allowed to use the graft restricted arm RN informed.

## 2022-04-12 NOTE — Discharge Instructions (Signed)
Current home medications antibiotics.  Follow-up with wound care.  Return to the ED if you develop a fever or worsening drainage.

## 2022-04-19 ENCOUNTER — Other Ambulatory Visit: Payer: Self-pay | Admitting: Internal Medicine

## 2022-04-19 ENCOUNTER — Other Ambulatory Visit (HOSPITAL_COMMUNITY): Payer: Self-pay | Admitting: Internal Medicine

## 2022-04-19 DIAGNOSIS — B999 Unspecified infectious disease: Secondary | ICD-10-CM

## 2022-04-24 ENCOUNTER — Ambulatory Visit (HOSPITAL_COMMUNITY)
Admission: RE | Admit: 2022-04-24 | Discharge: 2022-04-24 | Disposition: A | Discharge status: Home / Self Care | Payer: Medicare Other | Source: Ambulatory Visit | Attending: Internal Medicine | Admitting: Internal Medicine

## 2022-04-24 DIAGNOSIS — B999 Unspecified infectious disease: Secondary | ICD-10-CM | POA: Insufficient documentation

## 2022-04-26 ENCOUNTER — Encounter (HOSPITAL_BASED_OUTPATIENT_CLINIC_OR_DEPARTMENT_OTHER): Payer: Medicare Other | Admitting: Internal Medicine

## 2022-04-27 ENCOUNTER — Other Ambulatory Visit (HOSPITAL_COMMUNITY): Payer: Self-pay | Admitting: Internal Medicine

## 2022-04-27 DIAGNOSIS — B999 Unspecified infectious disease: Secondary | ICD-10-CM

## 2022-05-02 ENCOUNTER — Ambulatory Visit (HOSPITAL_COMMUNITY)
Admission: RE | Admit: 2022-05-02 | Discharge: 2022-05-02 | Disposition: A | Discharge status: Home / Self Care | Payer: Medicare Other | Source: Ambulatory Visit | Attending: Internal Medicine | Admitting: Internal Medicine

## 2022-05-02 DIAGNOSIS — B999 Unspecified infectious disease: Secondary | ICD-10-CM | POA: Insufficient documentation

## 2022-05-02 MED ORDER — LIDOCAINE HCL 1 % IJ SOLN
INTRAMUSCULAR | Status: AC
  Administered 2022-05-02: 1 mL via INTRADERMAL
  Filled 2022-05-02: qty 20

## 2022-05-02 MED ORDER — HEPARIN SOD (PORK) LOCK FLUSH 100 UNIT/ML IV SOLN
INTRAVENOUS | Status: AC
  Administered 2022-05-02: 500 [IU] via INTRAVENOUS
  Filled 2022-05-02: qty 5

## 2022-05-02 NOTE — Procedures (Signed)
PROCEDURE SUMMARY:  Successful placement of single lumen PICC line to left brachia vein. Length 34 cm Tip at lower SVC/RA PICC capped No complications Ready for use  EBL < 5 mL   Gelsey Amyx H Idan Prime PA-C 05/02/2022, 2:53 PM

## 2022-05-03 ENCOUNTER — Other Ambulatory Visit (HOSPITAL_COMMUNITY): Payer: Self-pay | Admitting: Internal Medicine

## 2022-05-03 DIAGNOSIS — N186 End stage renal disease: Secondary | ICD-10-CM

## 2022-05-08 ENCOUNTER — Ambulatory Visit (HOSPITAL_COMMUNITY)
Admission: RE | Admit: 2022-05-08 | Discharge: 2022-05-08 | Disposition: A | Discharge status: Home / Self Care | Payer: Medicare Other | Source: Ambulatory Visit | Attending: Internal Medicine | Admitting: Internal Medicine

## 2022-05-08 DIAGNOSIS — R1310 Dysphagia, unspecified: Secondary | ICD-10-CM | POA: Insufficient documentation

## 2022-05-08 DIAGNOSIS — N186 End stage renal disease: Secondary | ICD-10-CM

## 2022-05-08 DIAGNOSIS — Z431 Encounter for attention to gastrostomy: Secondary | ICD-10-CM | POA: Insufficient documentation

## 2022-05-08 HISTORY — PX: IR GASTROSTOMY TUBE REMOVAL: IMG5492

## 2022-05-08 NOTE — Procedures (Signed)
Pre-procedure diagnosis: Dysphagia Post-procedure diagnosis: Same  Successful bedside removal of 20 Fr balloon retention gastrostomy. No immediate post procedural complications. EBL: zero  Please see imaging section of Epic for full dictation.  Joaquim Nam 05/08/2022 10:26 AM

## 2022-06-27 ENCOUNTER — Telehealth: Payer: Self-pay

## 2022-06-27 ENCOUNTER — Emergency Department (HOSPITAL_COMMUNITY): Payer: Medicare Other

## 2022-06-27 ENCOUNTER — Inpatient Hospital Stay (HOSPITAL_COMMUNITY)
Admission: EM | Admit: 2022-06-27 | Discharge: 2022-07-04 | DRG: 239 | Disposition: A | Discharge status: Skilled Nursing Facility | Payer: Medicare Other | Source: Skilled Nursing Facility | Attending: Internal Medicine | Admitting: Internal Medicine

## 2022-06-27 ENCOUNTER — Other Ambulatory Visit: Payer: Self-pay

## 2022-06-27 ENCOUNTER — Encounter (HOSPITAL_COMMUNITY): Payer: Self-pay | Admitting: Pharmacy Technician

## 2022-06-27 DIAGNOSIS — N2581 Secondary hyperparathyroidism of renal origin: Secondary | ICD-10-CM | POA: Diagnosis present

## 2022-06-27 DIAGNOSIS — Z79899 Other long term (current) drug therapy: Secondary | ICD-10-CM

## 2022-06-27 DIAGNOSIS — Z9109 Other allergy status, other than to drugs and biological substances: Secondary | ICD-10-CM

## 2022-06-27 DIAGNOSIS — Q2112 Patent foramen ovale: Secondary | ICD-10-CM

## 2022-06-27 DIAGNOSIS — E1169 Type 2 diabetes mellitus with other specified complication: Secondary | ICD-10-CM | POA: Diagnosis present

## 2022-06-27 DIAGNOSIS — Z7984 Long term (current) use of oral hypoglycemic drugs: Secondary | ICD-10-CM

## 2022-06-27 DIAGNOSIS — Z8673 Personal history of transient ischemic attack (TIA), and cerebral infarction without residual deficits: Secondary | ICD-10-CM

## 2022-06-27 DIAGNOSIS — Z885 Allergy status to narcotic agent status: Secondary | ICD-10-CM

## 2022-06-27 DIAGNOSIS — Z89511 Acquired absence of right leg below knee: Secondary | ICD-10-CM

## 2022-06-27 DIAGNOSIS — E1151 Type 2 diabetes mellitus with diabetic peripheral angiopathy without gangrene: Secondary | ICD-10-CM | POA: Diagnosis not present

## 2022-06-27 DIAGNOSIS — H5461 Unqualified visual loss, right eye, normal vision left eye: Secondary | ICD-10-CM | POA: Diagnosis present

## 2022-06-27 DIAGNOSIS — I739 Peripheral vascular disease, unspecified: Secondary | ICD-10-CM | POA: Diagnosis present

## 2022-06-27 DIAGNOSIS — G610 Guillain-Barre syndrome: Secondary | ICD-10-CM | POA: Diagnosis present

## 2022-06-27 DIAGNOSIS — Z91010 Allergy to peanuts: Secondary | ICD-10-CM

## 2022-06-27 DIAGNOSIS — Z833 Family history of diabetes mellitus: Secondary | ICD-10-CM

## 2022-06-27 DIAGNOSIS — N186 End stage renal disease: Secondary | ICD-10-CM | POA: Diagnosis present

## 2022-06-27 DIAGNOSIS — F419 Anxiety disorder, unspecified: Secondary | ICD-10-CM | POA: Diagnosis present

## 2022-06-27 DIAGNOSIS — D631 Anemia in chronic kidney disease: Secondary | ICD-10-CM | POA: Diagnosis present

## 2022-06-27 DIAGNOSIS — Z992 Dependence on renal dialysis: Secondary | ICD-10-CM

## 2022-06-27 DIAGNOSIS — M67879 Other specified disorders of synovium and tendon, unspecified ankle and foot: Secondary | ICD-10-CM | POA: Diagnosis present

## 2022-06-27 DIAGNOSIS — M86172 Other acute osteomyelitis, left ankle and foot: Secondary | ICD-10-CM | POA: Diagnosis present

## 2022-06-27 DIAGNOSIS — F4310 Post-traumatic stress disorder, unspecified: Secondary | ICD-10-CM | POA: Diagnosis present

## 2022-06-27 DIAGNOSIS — I9589 Other hypotension: Secondary | ICD-10-CM | POA: Diagnosis present

## 2022-06-27 DIAGNOSIS — I120 Hypertensive chronic kidney disease with stage 5 chronic kidney disease or end stage renal disease: Secondary | ICD-10-CM | POA: Diagnosis present

## 2022-06-27 DIAGNOSIS — E11649 Type 2 diabetes mellitus with hypoglycemia without coma: Secondary | ICD-10-CM | POA: Diagnosis not present

## 2022-06-27 DIAGNOSIS — Z880 Allergy status to penicillin: Secondary | ICD-10-CM

## 2022-06-27 DIAGNOSIS — E785 Hyperlipidemia, unspecified: Secondary | ICD-10-CM | POA: Diagnosis present

## 2022-06-27 DIAGNOSIS — M8580 Other specified disorders of bone density and structure, unspecified site: Secondary | ICD-10-CM | POA: Diagnosis present

## 2022-06-27 DIAGNOSIS — Z794 Long term (current) use of insulin: Secondary | ICD-10-CM

## 2022-06-27 DIAGNOSIS — E1122 Type 2 diabetes mellitus with diabetic chronic kidney disease: Secondary | ICD-10-CM | POA: Diagnosis present

## 2022-06-27 DIAGNOSIS — E1165 Type 2 diabetes mellitus with hyperglycemia: Secondary | ICD-10-CM | POA: Diagnosis present

## 2022-06-27 DIAGNOSIS — M609 Myositis, unspecified: Secondary | ICD-10-CM | POA: Diagnosis present

## 2022-06-27 NOTE — Telephone Encounter (Signed)
Pt's wound nurse at [facility] called to schedule an appt for pt who has non-healing L heel wound. They have been treating area for months. It has been debrided and infected again per wound nurse. She is currently on Doxycycline. Per nurse the heel is "mainly bone". They have requested only tues or thurs. Appts.Pt has been scheduled to see MD with studies next Tuesday.

## 2022-06-27 NOTE — ED Provider Triage Note (Signed)
Emergency Medicine Provider Triage Evaluation Note  Hadasa Gasner , a 41 y.o. female  was evaluated in triage.  Pt complains of foot wound.  She has a past medical head history of diabetes, status post right leg amputation, bedbound, history of infarct, lives in skilled nursing facility.  She has been dealing with a foot wound on the right heel for a long period of time.  There was a wound doctor came in for to assess it today and was highly concerned for development of osteomyelitis with exposed bony tissue and she was sent in for further evaluation..  Review of Systems  Positive: Foot wound Negative: Fever  Physical Exam  BP (!) 152/84 (BP Location: Left Arm)   Pulse 87   Temp 97.8 F (36.6 C) (Oral)   Resp 16   SpO2 96%  Gen:   Awake, no distress   Resp:  Normal effort  MSK:   Moves extremities without difficulty  Other:  Right heel foot wound, foul odor.  Medical Decision Making  Medically screening exam initiated at 6:26 PM.  Appropriate orders placed.  Azaliah Carrero was informed that the remainder of the evaluation will be completed by another provider, this initial triage assessment does not replace that evaluation, and the importance of remaining in the ED until their evaluation is complete.     Margarita Mail, PA-C 06/27/22 1832

## 2022-06-27 NOTE — ED Triage Notes (Signed)
Pt. Sent from [facility] for a lt. Heel wound.  Wound was cleaned and debrided from the wound doctor at the facility and the wound is down to the bone

## 2022-06-28 DIAGNOSIS — M609 Myositis, unspecified: Secondary | ICD-10-CM | POA: Diagnosis present

## 2022-06-28 DIAGNOSIS — Z992 Dependence on renal dialysis: Secondary | ICD-10-CM | POA: Diagnosis not present

## 2022-06-28 DIAGNOSIS — F4310 Post-traumatic stress disorder, unspecified: Secondary | ICD-10-CM | POA: Diagnosis present

## 2022-06-28 DIAGNOSIS — N186 End stage renal disease: Secondary | ICD-10-CM | POA: Diagnosis present

## 2022-06-28 DIAGNOSIS — Z89511 Acquired absence of right leg below knee: Secondary | ICD-10-CM | POA: Diagnosis not present

## 2022-06-28 DIAGNOSIS — D631 Anemia in chronic kidney disease: Secondary | ICD-10-CM | POA: Diagnosis present

## 2022-06-28 DIAGNOSIS — M869 Osteomyelitis, unspecified: Secondary | ICD-10-CM | POA: Diagnosis not present

## 2022-06-28 DIAGNOSIS — E1169 Type 2 diabetes mellitus with other specified complication: Secondary | ICD-10-CM | POA: Diagnosis present

## 2022-06-28 DIAGNOSIS — E1122 Type 2 diabetes mellitus with diabetic chronic kidney disease: Secondary | ICD-10-CM | POA: Diagnosis present

## 2022-06-28 DIAGNOSIS — M86172 Other acute osteomyelitis, left ankle and foot: Secondary | ICD-10-CM

## 2022-06-28 DIAGNOSIS — M86372 Chronic multifocal osteomyelitis, left ankle and foot: Secondary | ICD-10-CM | POA: Diagnosis not present

## 2022-06-28 DIAGNOSIS — Q2112 Patent foramen ovale: Secondary | ICD-10-CM | POA: Diagnosis not present

## 2022-06-28 DIAGNOSIS — I120 Hypertensive chronic kidney disease with stage 5 chronic kidney disease or end stage renal disease: Secondary | ICD-10-CM | POA: Diagnosis present

## 2022-06-28 DIAGNOSIS — H5461 Unqualified visual loss, right eye, normal vision left eye: Secondary | ICD-10-CM | POA: Diagnosis present

## 2022-06-28 DIAGNOSIS — N2581 Secondary hyperparathyroidism of renal origin: Secondary | ICD-10-CM | POA: Diagnosis present

## 2022-06-28 DIAGNOSIS — G610 Guillain-Barre syndrome: Secondary | ICD-10-CM | POA: Diagnosis present

## 2022-06-28 DIAGNOSIS — I9589 Other hypotension: Secondary | ICD-10-CM | POA: Diagnosis present

## 2022-06-28 DIAGNOSIS — Z7984 Long term (current) use of oral hypoglycemic drugs: Secondary | ICD-10-CM | POA: Diagnosis not present

## 2022-06-28 DIAGNOSIS — I739 Peripheral vascular disease, unspecified: Secondary | ICD-10-CM | POA: Diagnosis not present

## 2022-06-28 DIAGNOSIS — Z79899 Other long term (current) drug therapy: Secondary | ICD-10-CM | POA: Diagnosis not present

## 2022-06-28 DIAGNOSIS — E1165 Type 2 diabetes mellitus with hyperglycemia: Secondary | ICD-10-CM | POA: Diagnosis present

## 2022-06-28 DIAGNOSIS — E11649 Type 2 diabetes mellitus with hypoglycemia without coma: Secondary | ICD-10-CM | POA: Diagnosis not present

## 2022-06-28 DIAGNOSIS — Z794 Long term (current) use of insulin: Secondary | ICD-10-CM | POA: Diagnosis not present

## 2022-06-28 DIAGNOSIS — E1151 Type 2 diabetes mellitus with diabetic peripheral angiopathy without gangrene: Secondary | ICD-10-CM | POA: Diagnosis present

## 2022-06-28 DIAGNOSIS — M67879 Other specified disorders of synovium and tendon, unspecified ankle and foot: Secondary | ICD-10-CM | POA: Diagnosis present

## 2022-06-28 DIAGNOSIS — E785 Hyperlipidemia, unspecified: Secondary | ICD-10-CM | POA: Diagnosis present

## 2022-06-28 DIAGNOSIS — M8580 Other specified disorders of bone density and structure, unspecified site: Secondary | ICD-10-CM | POA: Diagnosis present

## 2022-06-28 LAB — CBC WITH DIFFERENTIAL/PLATELET
Abs Immature Granulocytes: 0.03 10*3/uL (ref 0.00–0.07)
Basophils Absolute: 0.1 10*3/uL (ref 0.0–0.1)
Basophils Relative: 1 %
Eosinophils Absolute: 0.4 10*3/uL (ref 0.0–0.5)
Eosinophils Relative: 5 %
HCT: 38.9 % (ref 36.0–46.0)
Hemoglobin: 11.8 g/dL — ABNORMAL LOW (ref 12.0–15.0)
Immature Granulocytes: 0 %
Lymphocytes Relative: 21 %
Lymphs Abs: 1.7 10*3/uL (ref 0.7–4.0)
MCH: 25.8 pg — ABNORMAL LOW (ref 26.0–34.0)
MCHC: 30.3 g/dL (ref 30.0–36.0)
MCV: 85.1 fL (ref 80.0–100.0)
Monocytes Absolute: 0.5 10*3/uL (ref 0.1–1.0)
Monocytes Relative: 7 %
Neutro Abs: 5.4 10*3/uL (ref 1.7–7.7)
Neutrophils Relative %: 66 %
Platelets: 185 10*3/uL (ref 150–400)
RBC: 4.57 MIL/uL (ref 3.87–5.11)
RDW: 19.9 % — ABNORMAL HIGH (ref 11.5–15.5)
WBC: 8 10*3/uL (ref 4.0–10.5)
nRBC: 0 % (ref 0.0–0.2)

## 2022-06-28 LAB — COMPREHENSIVE METABOLIC PANEL
ALT: 10 U/L (ref 0–44)
AST: 20 U/L (ref 15–41)
Albumin: 2.7 g/dL — ABNORMAL LOW (ref 3.5–5.0)
Alkaline Phosphatase: 101 U/L (ref 38–126)
Anion gap: 20 — ABNORMAL HIGH (ref 5–15)
BUN: 31 mg/dL — ABNORMAL HIGH (ref 6–20)
CO2: 28 mmol/L (ref 22–32)
Calcium: 9.3 mg/dL (ref 8.9–10.3)
Chloride: 94 mmol/L — ABNORMAL LOW (ref 98–111)
Creatinine, Ser: 4.68 mg/dL — ABNORMAL HIGH (ref 0.44–1.00)
GFR, Estimated: 11 mL/min — ABNORMAL LOW (ref >60)
Glucose, Bld: 185 mg/dL — ABNORMAL HIGH (ref 70–99)
Potassium: 4.3 mmol/L (ref 3.5–5.1)
Sodium: 142 mmol/L (ref 135–145)
Total Bilirubin: 0.3 mg/dL (ref 0.3–1.2)
Total Protein: 7.6 g/dL (ref 6.5–8.1)

## 2022-06-28 LAB — GLUCOSE, CAPILLARY
Glucose-Capillary: 167 mg/dL — ABNORMAL HIGH (ref 70–99)
Glucose-Capillary: 183 mg/dL — ABNORMAL HIGH (ref 70–99)
Glucose-Capillary: 224 mg/dL — ABNORMAL HIGH (ref 70–99)
Glucose-Capillary: 291 mg/dL — ABNORMAL HIGH (ref 70–99)

## 2022-06-28 LAB — C-REACTIVE PROTEIN: CRP: 6.9 mg/dL — ABNORMAL HIGH (ref <1.0)

## 2022-06-28 LAB — CBG MONITORING, ED: Glucose-Capillary: 210 mg/dL — ABNORMAL HIGH (ref 70–99)

## 2022-06-28 LAB — PROTIME-INR
INR: 1.3 — ABNORMAL HIGH (ref 0.8–1.2)
Prothrombin Time: 15.6 seconds — ABNORMAL HIGH (ref 11.4–15.2)

## 2022-06-28 LAB — LACTIC ACID, PLASMA
Lactic Acid, Venous: 3.5 mmol/L (ref 0.5–1.9)
Lactic Acid, Venous: 3.2 mmol/L (ref 0.5–1.9)

## 2022-06-28 LAB — HEMOGLOBIN A1C
Hgb A1c MFr Bld: 5.1 % (ref 4.8–5.6)
Mean Plasma Glucose: 99.67 mg/dL

## 2022-06-28 LAB — I-STAT BETA HCG BLOOD, ED (MC, WL, AP ONLY): I-stat hCG, quantitative: <5 m[IU]/mL (ref <5)

## 2022-06-28 LAB — PHOSPHORUS: Phosphorus: 4.6 mg/dL (ref 2.5–4.6)

## 2022-06-28 LAB — APTT: aPTT: 43 seconds — ABNORMAL HIGH (ref 24–36)

## 2022-06-28 LAB — SEDIMENTATION RATE: Sed Rate: 11 mm/hr (ref 0–22)

## 2022-06-28 LAB — HEPATITIS B SURFACE ANTIGEN: Hepatitis B Surface Ag: NONREACTIVE

## 2022-06-28 LAB — MAGNESIUM: Magnesium: 2.2 mg/dL (ref 1.7–2.4)

## 2022-06-28 MED ORDER — VANCOMYCIN HCL IN DEXTROSE 1-5 GM/200ML-% IV SOLN
1000.0000 mg | Freq: Once | INTRAVENOUS | Status: AC
  Administered 2022-06-28: 1000 mg via INTRAVENOUS
  Filled 2022-06-28: qty 200

## 2022-06-28 MED ORDER — ONDANSETRON HCL 4 MG/2ML IJ SOLN: 4.0000 mg | Freq: Four times a day (QID) | INTRAMUSCULAR | Status: DC | PRN

## 2022-06-28 MED ORDER — INSULIN GLARGINE-YFGN 100 UNIT/ML ~~LOC~~ SOLN: 5.0000 [IU] | Freq: Two times a day (BID) | SUBCUTANEOUS | Status: DC

## 2022-06-28 MED ORDER — SODIUM CHLORIDE 0.9 % IV SOLN
1.0000 g | INTRAVENOUS | Status: DC
  Administered 2022-06-28 – 2022-07-03 (×6): 1 g via INTRAVENOUS
  Filled 2022-06-28 (×7): qty 10

## 2022-06-28 MED ORDER — THIAMINE MONONITRATE 100 MG PO TABS
100.0000 mg | ORAL_TABLET | Freq: Every day | ORAL | Status: DC
  Administered 2022-06-28 – 2022-07-04 (×7): 100 mg
  Filled 2022-06-28 (×8): qty 1

## 2022-06-28 MED ORDER — MIDODRINE HCL 5 MG PO TABS
5.0000 mg | ORAL_TABLET | Freq: Three times a day (TID) | ORAL | Status: DC
  Administered 2022-06-28 – 2022-07-03 (×15): 5 mg via ORAL
  Filled 2022-06-28 (×17): qty 1

## 2022-06-28 MED ORDER — METFORMIN HCL 500 MG PO TABS: 500.0000 mg | ORAL_TABLET | Freq: Every day | ORAL | Status: DC

## 2022-06-28 MED ORDER — INSULIN ASPART 100 UNIT/ML IJ SOLN
0.0000 [IU] | Freq: Every day | INTRAMUSCULAR | Status: DC
  Administered 2022-06-28: 3 [IU] via SUBCUTANEOUS

## 2022-06-28 MED ORDER — ADULT MULTIVITAMIN W/MINERALS CH
1.0000 | ORAL_TABLET | Freq: Every morning | ORAL | Status: DC
  Administered 2022-06-29 – 2022-07-04 (×5): 1 via ORAL
  Filled 2022-06-28 (×5): qty 1

## 2022-06-28 MED ORDER — SODIUM CHLORIDE 0.9 % IV SOLN: 250.0000 mL | INTRAVENOUS | Status: DC | PRN

## 2022-06-28 MED ORDER — ONDANSETRON HCL 4 MG PO TABS: 4.0000 mg | ORAL_TABLET | Freq: Four times a day (QID) | ORAL | Status: DC | PRN

## 2022-06-28 MED ORDER — HEPARIN SODIUM (PORCINE) 5000 UNIT/ML IJ SOLN
5000.0000 [IU] | Freq: Three times a day (TID) | INTRAMUSCULAR | Status: DC
  Administered 2022-06-28 – 2022-07-01 (×11): 5000 [IU] via SUBCUTANEOUS
  Filled 2022-06-28 (×13): qty 1

## 2022-06-28 MED ORDER — EPOETIN ALFA-EPBX 10000 UNIT/ML IJ SOLN: 10000.0000 [IU] | INTRAMUSCULAR | Status: DC

## 2022-06-28 MED ORDER — SENNOSIDES 8.8 MG/5ML PO SYRP
5.0000 mL | ORAL_SOLUTION | Freq: Two times a day (BID) | ORAL | Status: DC
  Administered 2022-06-28 – 2022-07-04 (×3): 5 mL
  Filled 2022-06-28 (×14): qty 5

## 2022-06-28 MED ORDER — ATORVASTATIN CALCIUM 40 MG PO TABS
40.0000 mg | ORAL_TABLET | Freq: Every day | ORAL | Status: DC
  Administered 2022-06-29 – 2022-07-04 (×6): 40 mg via ORAL
  Filled 2022-06-28 (×6): qty 1

## 2022-06-28 MED ORDER — CINACALCET HCL 30 MG PO TABS
90.0000 mg | ORAL_TABLET | Freq: Every day | ORAL | Status: DC
  Administered 2022-06-29 – 2022-07-04 (×5): 90 mg via ORAL
  Filled 2022-06-28 (×6): qty 3

## 2022-06-28 MED ORDER — SODIUM CHLORIDE 0.9% FLUSH
3.0000 mL | Freq: Two times a day (BID) | INTRAVENOUS | Status: DC
  Administered 2022-06-28 – 2022-07-04 (×12): 3 mL via INTRAVENOUS

## 2022-06-28 MED ORDER — MEDIHONEY WOUND/BURN DRESSING EX PSTE
1.0000 | PASTE | Freq: Every day | CUTANEOUS | Status: DC
  Administered 2022-06-28 – 2022-07-04 (×5): 1 via TOPICAL
  Filled 2022-06-28 (×2): qty 44

## 2022-06-28 MED ORDER — DEXCOM G6 TRANSMITTER MISC: 1.0000 | Status: DC

## 2022-06-28 MED ORDER — INSULIN ASPART 100 UNIT/ML IJ SOLN
0.0000 [IU] | Freq: Three times a day (TID) | INTRAMUSCULAR | Status: DC
  Administered 2022-06-28 (×2): 2 [IU] via SUBCUTANEOUS
  Administered 2022-06-28 – 2022-06-30 (×2): 1 [IU] via SUBCUTANEOUS
  Administered 2022-07-01: 2 [IU] via SUBCUTANEOUS
  Administered 2022-07-01: 3 [IU] via SUBCUTANEOUS
  Administered 2022-07-01: 1 [IU] via SUBCUTANEOUS

## 2022-06-28 MED ORDER — INSULIN GLARGINE-YFGN 100 UNIT/ML ~~LOC~~ SOLN
5.0000 [IU] | Freq: Two times a day (BID) | SUBCUTANEOUS | Status: DC
  Administered 2022-06-28: 5 [IU] via SUBCUTANEOUS
  Filled 2022-06-28 (×3): qty 0.05

## 2022-06-28 MED ORDER — DORZOLAMIDE HCL-TIMOLOL MAL 2-0.5 % OP SOLN
1.0000 [drp] | Freq: Two times a day (BID) | OPHTHALMIC | Status: DC
  Administered 2022-06-28 – 2022-07-04 (×12): 1 [drp] via OPHTHALMIC
  Filled 2022-06-28: qty 10

## 2022-06-28 MED ORDER — NYSTATIN 100000 UNIT/ML MT SUSP
5.0000 mL | Freq: Four times a day (QID) | OROMUCOSAL | Status: DC
  Administered 2022-06-28 – 2022-07-04 (×13): 500000 [IU] via ORAL
  Filled 2022-06-28 (×16): qty 5

## 2022-06-28 MED ORDER — SODIUM CHLORIDE 0.9% FLUSH: 3.0000 mL | INTRAVENOUS | Status: DC | PRN

## 2022-06-28 MED ORDER — INSULIN GLARGINE-YFGN 100 UNIT/ML ~~LOC~~ SOPN
5.0000 [IU] | PEN_INJECTOR | Freq: Two times a day (BID) | SUBCUTANEOUS | Status: DC
  Filled 2022-06-28: qty 3

## 2022-06-28 MED ORDER — OXYCODONE HCL 5 MG PO TABS
5.0000 mg | ORAL_TABLET | ORAL | Status: DC | PRN
  Administered 2022-06-28 – 2022-07-01 (×8): 5 mg via ORAL
  Filled 2022-06-28 (×8): qty 1

## 2022-06-28 MED ORDER — HYDROMORPHONE HCL 1 MG/ML IJ SOLN
0.5000 mg | INTRAMUSCULAR | Status: DC | PRN
  Administered 2022-06-28 – 2022-07-04 (×8): 1 mg via INTRAVENOUS
  Filled 2022-06-28 (×10): qty 1

## 2022-06-28 MED ORDER — ACETAMINOPHEN 650 MG RE SUPP: 650.0000 mg | Freq: Four times a day (QID) | RECTAL | Status: DC | PRN

## 2022-06-28 MED ORDER — CHLORHEXIDINE GLUCONATE CLOTH 2 % EX PADS
6.0000 | MEDICATED_PAD | Freq: Every day | CUTANEOUS | Status: DC
  Administered 2022-06-29 – 2022-06-30 (×2): 6 via TOPICAL

## 2022-06-28 MED ORDER — ACETAMINOPHEN 325 MG PO TABS
650.0000 mg | ORAL_TABLET | Freq: Four times a day (QID) | ORAL | Status: DC | PRN
  Administered 2022-06-29 – 2022-07-01 (×2): 650 mg via ORAL
  Filled 2022-06-28 (×2): qty 2

## 2022-06-28 NOTE — ED Notes (Signed)
Pt repositioned and head of bed elevated. [place] sandwich given and pt updated on plan of care. Call bell within reach, denies any further needs at this time.

## 2022-06-28 NOTE — Consult Note (Addendum)
Hospital Consult    Reason for Consult:  osteomyelitis left heel Requesting Physician:  Erin Hearing NP MRN #:  [id number]  History of Present Illness: This is a 41 y.o. female who is well known to [facility]. She has PMH significant for Type II DM, CVA, ESRD on HD MWF, and PAD with right AKA earlier this year by Dr. Trula Slade and chronic left heel wound. She resides at [facility] who sent her to ED due to increasing pain in left heel, drainage, and visible bone in wound. She is unable to really explain how long it has been hurting. She expresses understanding that there is an infection in her heel. She has been on Doxycycline for a couple days without any improvement.  In the ER she is afebrile. She is without leukocytosis.  Renal function at baseline, CRP elevated at 6.9, lactic acid 3.5. MRI left foot revealed large soft tissue wound at the heel with extensive findings of acute osteomyelitis involving the calcaneus there was also some distal Achilles tendinosis.   She reports that she does not ambulate. She uses hoyer lift.   Past Medical History:  Diagnosis Date   Anemia    Anxiety    Blind right eye 2008   Diabetes mellitus without complication ([place])    Embolic stroke ([place])    ESRD (end stage renal disease) ([place])    HD on M/W/F   ESRD on hemodialysis ([place])    GBS (Guillain Barre syndrome) ([place])     Past Surgical History:  Procedure Laterality Date   AMPUTATION Right 08/11/2021   Procedure: TRANSMETATARSAL AMPUTATION OF RIGHT FOOT;  Surgeon: Serafina Mitchell, MD;  Location: [place];  Service: Vascular;  Laterality: Right;   AMPUTATION Right 09/17/2021   Procedure: AMPUTATION RIGHT BELOW KNEE;  Surgeon: Serafina Mitchell, MD;  Location: [place];  Service: Vascular;  Laterality: Right;   AMPUTATION Right 10/11/2021   Procedure: RIGHT ABOVE KNEE AMPUTATION;  Surgeon: Serafina Mitchell, MD;  Location: [place];  Service: Vascular;  Laterality: Right;   AMPUTATION TOE Left    APPLICATION OF WOUND  VAC  08/16/2021   Procedure: APPLICATION OF WOUND VAC;  Surgeon: Serafina Mitchell, MD;  Location: [place];  Service: Vascular;;   AV FISTULA PLACEMENT     BUBBLE STUDY  10/02/2021   Procedure: BUBBLE STUDY;  Surgeon: Fay Records, MD;  Location: [place];  Service: Cardiovascular;;   CESAREAN SECTION  2011   FISTULA SUPERFICIALIZATION Right 78/29/5621   Procedure: 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;  Surgeon: Angelia Mould, MD;  Location: [place];  Service: Vascular;  Laterality: Right;   INSERTION OF DIALYSIS CATHETER Left 07/27/2019   Procedure: INSERTION OF TUNNELED  DIALYSIS CATHETER;  Surgeon: Angelia Mould, MD;  Location: [place];  Service: Vascular;  Laterality: Left;   INSERTION OF DIALYSIS CATHETER Left 10/23/2021   Procedure: INSERTION OF TUNNELED PALINDROME PRECISION DIALYSIS CATHETER (23cm);  Surgeon: Marty Heck, MD;  Location: [facility];  Service: Vascular;  Laterality: Left;   IR FLUORO GUIDE CV LINE LEFT  10/16/2021   IR FLUORO GUIDE CV LINE RIGHT  10/11/2021   IR GASTROSTOMY TUBE MOD SED  10/30/2021   IR GASTROSTOMY TUBE REMOVAL  05/08/2022   IR US GUIDE VASC ACCESS LEFT  10/16/2021   IR US GUIDE VASC ACCESS RIGHT  10/11/2021   LOWER EXTREMITY ANGIOGRAPHY N/A 08/11/2021   Procedure: LOWER EXTREMITY ANGIOGRAPHY;  Surgeon: Serafina Mitchell, MD;  Location:  [place] INVASIVE CV LAB;  Service: Cardiovascular;  Laterality: N/A;   REVISON OF ARTERIOVENOUS FISTULA Right 10/23/2021   Procedure: REVISON OF ARTERIOVENOUS FISTULA  ARM AND PLICATION;  Surgeon: Marty Heck, MD;  Location: [place];  Service: Vascular;  Laterality: Right;   TEE WITHOUT CARDIOVERSION N/A 10/02/2021   Procedure: TRANSESOPHAGEAL ECHOCARDIOGRAM (TEE);  Surgeon: Fay Records, MD;  Location: [facility] ENDOSCOPY;  Service: Cardiovascular;  Laterality: N/A;   TRANSMETATARSAL AMPUTATION Right 08/16/2021   Procedure: CLOSURE OF RIGHT TRANSMETATARSAL AMPUTATION;  Surgeon: Serafina Mitchell, MD;   Location: [place];  Service: Vascular;  Laterality: Right;    Allergies  Allergen Reactions   Morphine Rash and Other (See Comments)   Peanut-Containing Drug Products Anaphylaxis and Hives   Penicillins Rash and Other (See Comments)    Rash in 2008.  Tolerated cefazolin in 2020   Chocolate Hives    Prior to Admission medications   Medication Sig Start Date End Date Taking? Authorizing Provider  atorvastatin (LIPITOR) 40 MG tablet Place 1 tablet (40 mg total) into feeding tube daily. 10/26/21  Yes Shelly Coss, MD  cinacalcet (SENSIPAR) 90 MG tablet Take 90 mg by mouth daily. 09/22/19  Yes [provider]  dorzolamide-timolol (COSOPT) 22.3-6.8 MG/ML ophthalmic solution Place 1 drop into the left eye 2 (two) times daily. 03/30/21  Yes [provider]  doxycycline (ADOXA) 100 MG tablet Take 100 mg by mouth 2 (two) times daily. For 7 days starting 06/20/22   Yes [provider]  insulin aspart (NOVOLOG) 100 UNIT/ML injection Inject 0-6 Units into the skin every 4 (four) hours. Patient taking differently: Inject 1-9 Units into the skin in the morning, at noon, in the evening, and at bedtime. 10/25/21  Yes Shelly Coss, MD  insulin glargine-yfgn (SEMGLEE) 100 UNIT/ML Pen Inject 5 Units into the skin 2 (two) times daily. 03/15/22  Yes [provider]  metFORMIN (GLUCOPHAGE) 500 MG tablet Take 500 mg by mouth daily. 03/31/22  Yes [provider]  midodrine (PROAMATINE) 5 MG tablet Take 5 mg by mouth 3 (three) times daily. 03/03/22  Yes [provider]  Multiple Vitamin (MULTIVITAMIN WITH MINERALS) TABS tablet Take 1 tablet by mouth daily.   Yes [provider]  NON FORMULARY Place 30 mLs into feeding tube daily. ProHeal   Yes [provider]  oxyCODONE (OXY IR/ROXICODONE) 5 MG immediate release tablet Take 5 mg by mouth in the morning and at bedtime.   Yes [provider]  sennosides (SENOKOT) 8.8 MG/5ML syrup Place 5 mLs  into feeding tube 2 (two) times daily. Patient taking differently: Place 8.8 mg into feeding tube 2 (two) times daily. 10/25/21  Yes Shelly Coss, MD  sodium chloride flush (NS) 0.9 % SOLN Inject 10 mLs into the vein at bedtime.   Yes [provider]  thiamine 100 MG tablet Place 1 tablet (100 mg total) into feeding tube daily. 10/26/21  Yes Shelly Coss, MD  Accu-Chek FastClix Lancets MISC Use as instructed to check blood sugar up to TID. E11.22 Patient taking differently: 1 each by Other route See admin instructions. Use as instructed to check blood sugar up to TID. E11.22 04/15/19   Charlott Rakes, MD  Blood Glucose Monitoring Suppl (ACCU-CHEK GUIDE ME) w/Device KIT 1 kit by Does not apply route 3 (three) times daily. Use to check BG at home up to 3 times daily. E11.22 04/15/19   Charlott Rakes, MD  Continuous Blood Gluc Transmit (DEXCOM G6 TRANSMITTER) MISC 1 Device  by Does not apply route as directed. 09/12/20   Shamleffer, Melanie Crazier, MD  epoetin alfa-epbx (RETACRIT) 13086 UNIT/ML injection Inject 10,000 Units into the vein See admin instructions. 10,000 units IM every Mon/Wed/Fri with dialysis    [provider]  glucose blood (ACCU-CHEK GUIDE) test strip Use as instructed to check blood sugar up to TID. E11.22 Patient taking differently: 1 each by Other route See admin instructions. Use as instructed to check blood sugar up to TID. E11.22 07/17/19   Fulp, Cammie, MD  Insulin Pen Needle 32G X 4 MM MISC 1 Device by Does not apply route as directed. 05/13/20   Shamleffer, Melanie Crazier, MD  nutrition supplement, JUVEN, (JUVEN) PACK Place 1 packet into feeding tube 2 (two) times daily between meals. Patient not taking: Reported on 04/03/2022 10/25/21   Shelly Coss, MD  Nutritional Supplements (FEEDING SUPPLEMENT, NEPRO CARB STEADY,) LIQD Place 960 mLs into feeding tube daily. Patient not taking: Reported on 04/03/2022 10/25/21   Shelly Coss, MD  Nutritional Supplements  (FEEDING SUPPLEMENT, PROSOURCE TF,) liquid Place 45 mLs into feeding tube 2 (two) times daily. Patient not taking: Reported on 04/03/2022 10/25/21   Shelly Coss, MD  Nutritional Supplements (NEPRO) LIQD Place 60 mL/hr into feeding tube See admin instructions. 60 ml's/hr, per tube- Start infusion at 1600 and stop at 0800. 16 hours total every shift for nutritional support. Administer 60 ml's/hr via G-Tube continuously with a 30 ml's flush every 2 hours for 16 hours. Patient not taking: Reported on 04/03/2022    [provider]  nystatin (MYCOSTATIN) 100000 UNIT/ML suspension Take 5 mLs (500,000 Units total) by mouth 4 (four) times daily. 10/25/21   Shelly Coss, MD  ondansetron (ZOFRAN) 4 MG tablet Take 4 mg by mouth every 6 (six) hours as needed for nausea/vomiting. 03/02/22   [provider]  oxyCODONE-acetaminophen (PERCOCET) 5-325 MG tablet Take 1 tablet by mouth every 6 (six) hours as needed (for pain). 04/06/22 04/06/23  Pokhrel, Corrie Mckusick, MD  SANTYL 250 UNIT/GM ointment Apply 1 Application topically See admin instructions. Apply to right above-the-knee amputation topically every day shift- for wound    [provider]  Water For Irrigation, Sterile (FREE WATER) SOLN Place 30 mLs into feeding tube every 4 (four) hours. 10/25/21   Shelly Coss, MD    Social History   Socioeconomic History   Marital status: Single    Spouse name: Not on file   Number of children: Not on file   Years of education: Not on file   Highest education level: Not on file  Occupational History   Not on file  Tobacco Use   Smoking status: Never   Smokeless tobacco: Never  Vaping Use   Vaping Use: Never used  Substance and Sexual Activity   Alcohol use: No   Drug use: No   Sexual activity: Not on file  Other Topics Concern   Not on file  Social History Narrative   Not on file   Social Determinants of Health   Financial Resource Strain: Not on file  Food Insecurity: Not on file   Transportation Needs: Not on file  Physical Activity: Not on file  Stress: Not on file  Social Connections: Not on file  Intimate Partner Violence: Not on file     Family History  Problem Relation Age of Onset   Diabetes Mother    Diabetes Father     ROS: Otherwise negative unless mentioned in HPI  Physical Examination  Vitals:   06/28/22  0900 06/28/22 0915  BP: (!) 117/56 (!) 124/57  Pulse:    Resp: 14 13  Temp:    SpO2:     There is no height or weight on file to calculate BMI.  General:  WDWN in NAD; chronically ill appearing Gait: Not observed HENT: WNL, normocephalic Pulmonary: normal non-labored breathing Cardiac: regular Vascular Exam/Pulses: No palpable distal pulses in left leg Extremities: Left heel dressed, malodorous drainage on dressings; right AKA well healed Musculoskeletal: no muscle wasting or atrophy  Neurologic: A&O X 3;  No focal weakness or paresthesias are detected; speech is fluent/normal Psychiatric:  The pt has Normal affect.   CBC    Component Value Date/Time   WBC 8.0 06/28/2022 0244   RBC 4.57 06/28/2022 0244   HGB 11.8 (L) 06/28/2022 0244   HGB 10.6 (L) 05/18/2020 0927   HCT 38.9 06/28/2022 0244   HCT 35.1 05/18/2020 0927   PLT 185 06/28/2022 0244   PLT 186 05/18/2020 0927   MCV 85.1 06/28/2022 0244   MCV 89 05/18/2020 0927   MCH 25.8 (L) 06/28/2022 0244   MCHC 30.3 06/28/2022 0244   RDW 19.9 (H) 06/28/2022 0244   RDW 13.9 05/18/2020 0927   LYMPHSABS 1.7 06/28/2022 0244   LYMPHSABS 1.6 05/18/2020 0927   MONOABS 0.5 06/28/2022 0244   EOSABS 0.4 06/28/2022 0244   EOSABS 0.6 (H) 05/18/2020 0927   BASOSABS 0.1 06/28/2022 0244   BASOSABS 0.0 05/18/2020 0927    BMET    Component Value Date/Time   NA 142 06/28/2022 0244   NA 133 (L) 05/18/2020 0927   K 4.3 06/28/2022 0244   CL 94 (L) 06/28/2022 0244   CO2 28 06/28/2022 0244   GLUCOSE 185 (H) 06/28/2022 0244   BUN 31 (H) 06/28/2022 0244   BUN 45 (H) 05/18/2020 0927    CREATININE 4.68 (H) 06/28/2022 0244   CALCIUM 9.3 06/28/2022 0244   CALCIUM 12.1 (H) 11/13/2021 2026   GFRNONAA 11 (L) 06/28/2022 0244   GFRAA 6 (L) 05/18/2020 0927    COAGS: Lab Results  Component Value Date   INR 1.2 08/10/2021   INR 1.1 06/30/2020     Non-Invasive Vascular Imaging:   MRI Left Foot without contrast: IMPRESSION: 1. Large soft tissue wound at the heel with extensive findings of acute osteomyelitis involving the calcaneus. 2. Trace tibiotalar and posterior subtalar joint effusions, favored reactive. 3. Distal Achilles tendinosis. The Achilles tendon inserts onto abnormal, partially eroded posterior calcaneus. 4. Findings of denervation or myositis involving the lower leg and foot musculature.  DG foot complete left: IMPRESSION: 1. Large soft tissue defect overlying the calcaneus, with mottled lucency and sclerosis of the underlying dorsal margin of the calcaneus compatible with osteomyelitis. 2. Diffuse osteopenia.  Statin:  Yes.   Beta Blocker:  No. Aspirin:  No. ACEI:  No. ARB:  No. CCB use:  No Other antiplatelets/anticoagulants:  No.    ASSESSMENT/PLAN: This is a 41 y.o. female with multiple medical co morbidities who presents from SNF with acute osteomyelitis of chronic left heel wound. She has known significant PAD. 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 She is non ambulatory. With her now worsening heel wound with osteomyelitis she is not candidate for revascularization. Discussed with her that she could attempt BKA however would be at high risk for requiring high level amputation. For this reason we have recommended left AKA. Patient understands and is agreeable to proceed. Patient seen  with Dr. Virl Cagey who is agreeable with above plan. He will provide further details regarding timing of surgery once he is able to check OR availability.   [name] PA-C [facility] [phone] 06/28/2022  10:10 AM  VASCULAR STAFF ADDENDUM: I have independently interviewed and examined the patient. I agree with the above. Skylinn unfortunately has a long list of medical comorbidities including type 2 diabetes, previous CVA, end-stage renal disease on HD, PAD.  She has already undergone right AKA earlier this year by Dr. Trula Slade. She is currently in assisted living, and requires a Harrel Lemon lift for mobility.  I had a long discussion with her regarding her left foot wounds.  Being that she is nonambulatory at baseline, [place] was offered continued wound care versus above-knee amputation.  After discussing the risk and benefits, Alexianna felt comfortable pursuing left above-knee amputation.  Current plan will be AKA Monday with my partner Dr. Scot Dock.  Please dialyze Sunday and make n.p.o. at midnight Sunday.  Should this change plan change, I will discuss with [name] and primary team.  Cassandria Santee, MD [facility] Phone Number: [phone] 06/28/2022 2:15 PM

## 2022-06-28 NOTE — Plan of Care (Signed)
  Problem: Coping: Goal: Ability to adjust to condition or change in health will improve Outcome: Progressing   Problem: Nutritional: Goal: Maintenance of adequate nutrition will improve Outcome: Progressing   Problem: Nutrition: Goal: Adequate nutrition will be maintained Outcome: Progressing   

## 2022-06-28 NOTE — Consult Note (Signed)
Renal Service Consult Note [facility]  [name] 06/28/2022 [name], MD Requesting Physician: A. Lissa Merlin, NP  Reason for Consult: ESRD pt w/  HPI: The patient is a 41 y.o. year-old w/ PMH as below who presents w/ a worsening L heel wound. In ED pt afeb, lactic acid up and MRI showed extensive acute osteomyelitis involving the heel. Hx on known PAD. Pt is being admitted. We are asked to see for dialysis.   Pt seen in room, no c/o at this time. Dialysis going well in OP setting. Lives in SNF.   ROS - denies CP, no joint pain, no HA, no blurry vision, no rash, no diarrhea, no nausea/ vomiting, no dysuria, no difficulty voiding   Past Medical History  Past Medical History:  Diagnosis Date   Anemia    Anxiety    Blind right eye 2008   Diabetes mellitus without complication ([place])    Embolic stroke ([place])    ESRD (end stage renal disease) ([place])    HD on M/W/F   ESRD on hemodialysis ([place])    GBS (Guillain Barre syndrome) ([place])    Past Surgical History  Past Surgical History:  Procedure Laterality Date   AMPUTATION Right 08/11/2021   Procedure: TRANSMETATARSAL AMPUTATION OF RIGHT FOOT;  Surgeon: Serafina Mitchell, MD;  Location: [place];  Service: Vascular;  Laterality: Right;   AMPUTATION Right 09/17/2021   Procedure: AMPUTATION RIGHT BELOW KNEE;  Surgeon: Serafina Mitchell, MD;  Location: [place];  Service: Vascular;  Laterality: Right;   AMPUTATION Right 10/11/2021   Procedure: RIGHT ABOVE KNEE AMPUTATION;  Surgeon: Serafina Mitchell, MD;  Location: [place];  Service: Vascular;  Laterality: Right;   AMPUTATION TOE Left    APPLICATION OF WOUND VAC  08/16/2021   Procedure: APPLICATION OF WOUND VAC;  Surgeon: Serafina Mitchell, MD;  Location: [place];  Service: Vascular;;   AV FISTULA PLACEMENT     BUBBLE STUDY  10/02/2021   Procedure: BUBBLE STUDY;  Surgeon: Fay Records, MD;  Location: [place];  Service: Cardiovascular;;   CESAREAN SECTION  2011   FISTULA  SUPERFICIALIZATION Right 08/21/3233   Procedure: 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;  Surgeon: Angelia Mould, MD;  Location: [place];  Service: Vascular;  Laterality: Right;   INSERTION OF DIALYSIS CATHETER Left 07/27/2019   Procedure: INSERTION OF TUNNELED  DIALYSIS CATHETER;  Surgeon: Angelia Mould, MD;  Location: [place];  Service: Vascular;  Laterality: Left;   INSERTION OF DIALYSIS CATHETER Left 10/23/2021   Procedure: INSERTION OF TUNNELED PALINDROME PRECISION DIALYSIS CATHETER (23cm);  Surgeon: Marty Heck, MD;  Location[facility] OR;  Service: Vascular;  Laterality: Left;   IR FLUORO GUIDE CV LINE LEFT  10/16/2021   IR FLUORO GUIDE CV LINE RIGHT  10/11/2021   IR GASTROSTOMY TUBE MOD SED  10/30/2021   IR GASTROSTOMY TUBE REMOVAL  05/08/2022   IR US GUIDE VASC ACCESS LEFT  10/16/2021   IR US GUIDE VASC ACCESS RIGHT  10/11/2021   LOWER EXTREMITY ANGIOGRAPHY N/A 08/11/2021   Procedure: LOWER EXTREMITY ANGIOGRAPHY;  Surgeon: Serafina Mitchell, MD;  Location: [place] CV LAB;  Service: Cardiovascular;  Laterality: N/A;   REVISON OF ARTERIOVENOUS FISTULA Right 10/23/2021   Procedure: REVISON OF ARTERIOVENOUS FISTULA  ARM AND PLICATION;  Surgeon: Marty Heck, MD;  Location: [place];  Service: Vascular;  Laterality: Right;   TEE WITHOUT CARDIOVERSION N/A 10/02/2021   Procedure: TRANSESOPHAGEAL ECHOCARDIOGRAM (  TEE);  Surgeon: Fay Records, MD;  [facility] ENDOSCOPY;  Service: Cardiovascular;  Laterality: N/A;   TRANSMETATARSAL AMPUTATION Right 08/16/2021   Procedure: CLOSURE OF RIGHT TRANSMETATARSAL AMPUTATION;  Surgeon: Serafina Mitchell, MD;  Location: [facility] OR;  Service: Vascular;  Laterality: Right;   Family History  Family History  Problem Relation Age of Onset   Diabetes Mother    Diabetes Father    Social History  reports that she has never smoked. She has never used smokeless tobacco. She reports that she does not drink alcohol and does not use  drugs. Allergies  Allergies  Allergen Reactions   Morphine Rash and Other (See Comments)   Peanut-Containing Drug Products Anaphylaxis and Hives   Penicillins Rash and Other (See Comments)    Rash in 2008.  Tolerated cefazolin in 2020   Chocolate Hives   Home medications Prior to Admission medications   Medication Sig Start Date End Date Taking? Authorizing Provider  atorvastatin (LIPITOR) 40 MG tablet Place 1 tablet (40 mg total) into feeding tube daily. 10/26/21  Yes Shelly Coss, MD  cinacalcet (SENSIPAR) 90 MG tablet Take 90 mg by mouth daily. 09/22/19  Yes [provider]  dorzolamide-timolol (COSOPT) 22.3-6.8 MG/ML ophthalmic solution Place 1 drop into the left eye 2 (two) times daily. 03/30/21  Yes [provider]  doxycycline (ADOXA) 100 MG tablet Take 100 mg by mouth 2 (two) times daily. For 7 days starting 06/20/22   Yes [provider]  Glucagon, rDNA, (GLUCAGON EMERGENCY) 1 MG KIT Inject 1 mg into the muscle once. If BS drops below 60 06/10/22  Yes [provider]  insulin aspart (NOVOLOG) 100 UNIT/ML injection Inject 0-6 Units into the skin every 4 (four) hours. Patient taking differently: Inject 1-9 Units into the skin in the morning, at noon, in the evening, and at bedtime. If BS is 121-150 inject 1 unit, BS 151-200 inject 2 units, BS 201-250 inject 3 units, BS 251-300 inject 5 units, BS 301-350 inject 7 units, BS 351-400 inject 9 units 10/25/21  Yes Adhikari, Amrit, MD  insulin glargine-yfgn (SEMGLEE) 100 UNIT/ML Pen Inject 5 Units into the skin 2 (two) times daily. 03/15/22  Yes [provider]  metFORMIN (GLUCOPHAGE) 500 MG tablet Take 500 mg by mouth daily. 03/31/22  Yes [provider]  midodrine (PROAMATINE) 5 MG tablet Take 5 mg by mouth 3 (three) times daily. 03/03/22  Yes [provider]  Multiple Vitamin (MULTIVITAMIN WITH MINERALS) TABS tablet Take 1 tablet by mouth daily.   Yes [provider]  NON  FORMULARY Place 30 mLs into feeding tube daily. ProHeal   Yes [provider]  nystatin (MYCOSTATIN) 100000 UNIT/ML suspension Take 5 mLs (500,000 Units total) by mouth 4 (four) times daily. 10/25/21  Yes Shelly Coss, MD  ondansetron (ZOFRAN) 4 MG tablet Take 4 mg by mouth every 6 (six) hours as needed for nausea/vomiting. 03/02/22  Yes [provider]  oxyCODONE (OXY IR/ROXICODONE) 5 MG immediate release tablet Take 5 mg by mouth in the morning and at bedtime.   Yes [provider]  oxyCODONE-acetaminophen (PERCOCET) 5-325 MG tablet Take 1 tablet by mouth every 6 (six) hours as needed (for pain). Patient taking differently: Take 1 tablet by mouth every 6 (six) hours as needed for severe pain. 04/06/22 04/06/23 Yes Pokhrel, Laxman, MD  sennosides (SENOKOT) 8.8 MG/5ML syrup Place 5 mLs into feeding tube 2 (two) times daily. Patient taking differently: Place 8.8 mg into feeding tube 2 (two)  times daily. 10/25/21  Yes Shelly Coss, MD  sodium chloride flush (NS) 0.9 % SOLN Inject 10 mLs into the vein at bedtime.   Yes [provider]  thiamine 100 MG tablet Place 1 tablet (100 mg total) into feeding tube daily. 10/26/21  Yes Shelly Coss, MD  Accu-Chek FastClix Lancets MISC Use as instructed to check blood sugar up to TID. E11.22 Patient taking differently: 1 each by Other route See admin instructions. Use as instructed to check blood sugar up to TID. E11.22 04/15/19   Charlott Rakes, MD  Blood Glucose Monitoring Suppl (ACCU-CHEK GUIDE ME) w/Device KIT 1 kit by Does not apply route 3 (three) times daily. Use to check BG at home up to 3 times daily. E11.22 04/15/19   Charlott Rakes, MD  Continuous Blood Gluc Transmit (DEXCOM G6 TRANSMITTER) MISC 1 Device by Does not apply route as directed. 09/12/20   Shamleffer, Melanie Crazier, MD  epoetin alfa-epbx (RETACRIT) 54627 UNIT/ML injection Inject 10,000 Units into the vein See admin instructions. 10,000 units IM every  Mon/Wed/Fri with dialysis Patient not taking: Reported on 06/28/2022    [provider]  glucose blood (ACCU-CHEK GUIDE) test strip Use as instructed to check blood sugar up to TID. E11.22 Patient taking differently: 1 each by Other route See admin instructions. Use as instructed to check blood sugar up to TID. E11.22 07/17/19   Fulp, Cammie, MD  Insulin Pen Needle 32G X 4 MM MISC 1 Device by Does not apply route as directed. 05/13/20   Shamleffer, Melanie Crazier, MD  nutrition supplement, JUVEN, (JUVEN) PACK Place 1 packet into feeding tube 2 (two) times daily between meals. Patient not taking: Reported on 04/03/2022 10/25/21   Shelly Coss, MD  Nutritional Supplements (FEEDING SUPPLEMENT, NEPRO CARB STEADY,) LIQD Place 960 mLs into feeding tube daily. Patient not taking: Reported on 04/03/2022 10/25/21   Shelly Coss, MD  Nutritional Supplements (FEEDING SUPPLEMENT, PROSOURCE TF,) liquid Place 45 mLs into feeding tube 2 (two) times daily. Patient not taking: Reported on 04/03/2022 10/25/21   Shelly Coss, MD  Nutritional Supplements (NEPRO) LIQD Place 60 mL/hr into feeding tube See admin instructions. 60 ml's/hr, per tube- Start infusion at 1600 and stop at 0800. 16 hours total every shift for nutritional support. Administer 60 ml's/hr via G-Tube continuously with a 30 ml's flush every 2 hours for 16 hours. Patient not taking: Reported on 04/03/2022    [provider]  SANTYL 250 UNIT/GM ointment Apply 1 Application topically See admin instructions. Apply to right above-the-knee amputation topically every day shift- for wound Patient not taking: Reported on 06/28/2022    [provider]  Water For Irrigation, Sterile (FREE WATER) SOLN Place 30 mLs into feeding tube every 4 (four) hours. Patient not taking: Reported on 06/28/2022 10/25/21   Shelly Coss, MD     Vitals:   06/28/22 0915 06/28/22 1015 06/28/22 1109 06/28/22 1219  BP: (!) 124/57 (!) 110/56 (!) 105/58  (!) 105/58  Pulse:   85 85  Resp: _0 Temp:   98 F (36.7 C) 98 F (36.7 C)  TempSrc:   Oral Oral  SpO2:   100%   Weight:   52.7 kg 52.7 kg  Height:    _1  (1.549 m)   Exam Gen alert, no distress No rash, cyanosis or gangrene Sclera anicteric, throat clear  No jvd or bruits Chest clear bilat to bases, no rales/ wheezing RRR no MRG Abd soft ntnd no mass or ascites +  bs GU defer MS  R AKA no wounds or edema Ext LLE no edema, foot wrapped Neuro is alert, Ox 3 , nf    AVF RUA + bruit   Home meds include - lipitor, sensipar 90 qd, insulin aspart/ glargine, metformin, midodrine 5 tid, MVI, oxy IR prn, nepro/ prosource bid-tid, prns/ vits/ supps   OP HD: [place] MWF   3h 96mn  400/1.5   51kg  2/2 bath  Hep none RUA AVF - mircera 225 ug q2, last 11/10, due 11/24 - last HD 11/15 post wt 52.5kg    Na 142  K 4.3  CO2 28  BUN 31  Creat 4.6  alb 2.7  LFT's okay  Hb 11.8    WBC 8K   LA 3.5, 3.2, CRP 6.9   Assessment/ Plan: L foot acute osteomyelitis - w/ underlying PAD, started on IV abx ESRD - on HD MWF. Has not missed HD. HD tomorrow.  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 Cont sensipar.  DM2 - per pmd      RKelly Splinter MD 06/28/2022, 1:28 PM Recent Labs  Lab 06/28/22 0244  HGB 11.8*  ALBUMIN 2.7*  CALCIUM 9.3  CREATININE 4.68*  K 4.3   Inpatient medications:  atorvastatin  40 mg Oral Daily   cinacalcet  90 mg Oral Daily   dorzolamide-timolol  1 drop Left Eye BID   epoetin alfa-epbx  10,000 Units Intravenous See admin instructions   heparin  5,000 Units Subcutaneous Q8H   insulin aspart  0-5 Units Subcutaneous QHS   insulin aspart  0-6 Units Subcutaneous TID WC   insulin glargine-yfgn  5 Units Subcutaneous BID   leptospermum manuka honey  1 Application Topical Daily   midodrine  5 mg Oral TID WC   multivitamin with minerals  1 tablet  Oral q morning   nystatin  5 mL Oral QID   sennosides  5 mL Per Tube BID   sodium chloride flush  3 mL Intravenous Q12H   thiamine  100 mg Per Tube Daily    sodium chloride     ceFEPime (MAXIPIME) IV     vancomycin     sodium chloride, acetaminophen **OR** acetaminophen, HYDROmorphone (DILAUDID) injection, ondansetron **OR** ondansetron (ZOFRAN) IV, oxyCODONE, sodium chloride flush

## 2022-06-28 NOTE — ED Provider Notes (Signed)
[facility] EMERGENCY DEPARTMENT Provider Note   CSN: [id number] Arrival date & time: 06/27/22  1626     History  Chief Complaint  Patient presents with   Wound Check    [name] is a 41 y.o. female who presents from facility according [place] for concern for infection of the left heel wound.  Patient with history of type 2 diabetes, CVA from PFO, guillan-barre syndrome, and ESRD on dialysis Monday/Wednesday/Friday with last session on Wednesday 11/15 who presents to the ED via EMS with concern for progressive worsening of wound to the left heel which has been managed by wound care nurse at her facility.  Patient has been on doxycycline for 48 hours per report, however concern for bony exposure at facility.  Patient endorses about 1 week of pain in the left heel, difficult to direct, patient repeatedly asking for food which was provided to her at bedside. In addition to bolus history she has history of hyperlipidemia, PTSD blindness in the right eye.  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  HPI     Home Medications Prior to Admission medications   Medication Sig Start Date End Date Taking? Authorizing Provider  Accu-Chek FastClix Lancets MISC Use as instructed to check blood sugar up to TID. E11.22 Patient taking differently: 1 each by Other route See admin instructions. Use as instructed to check blood sugar up to TID. E11.22 04/15/19   Charlott Rakes, MD  atorvastatin (LIPITOR) 40 MG tablet Place 1 tablet (40 mg total) into feeding tube daily. Patient taking differently: Take 40 mg by mouth daily. 10/26/21   Shelly Coss, MD  Blood Glucose Monitoring Suppl (ACCU-CHEK GUIDE ME) w/Device KIT 1 kit by Does not apply route 3 (three) times daily. Use to check BG at home up to 3 times daily. E11.22 04/15/19   Charlott Rakes, MD  cinacalcet (SENSIPAR) 90 MG tablet Take 90 mg by mouth daily. 09/22/19   [provider]  Continuous Blood Gluc Transmit (DEXCOM G6 TRANSMITTER)  MISC 1 Device by Does not apply route as directed. 09/12/20   Shamleffer, Melanie Crazier, MD  dorzolamide-timolol (COSOPT) 22.3-6.8 MG/ML ophthalmic solution Place 1 drop into the left eye 2 (two) times daily. 03/30/21   [provider]  epoetin alfa-epbx (RETACRIT) 63149 UNIT/ML injection Inject 10,000 Units into the vein See admin instructions. 10,000 units IM every Mon/Wed/Fri with dialysis    [provider]  glucose blood (ACCU-CHEK GUIDE) test strip Use as instructed to check blood sugar up to TID. E11.22 Patient taking differently: 1 each by Other route See admin instructions. Use as instructed to check blood sugar up to TID. E11.22 07/17/19   Fulp, Cammie, MD  insulin aspart (NOVOLOG) 100 UNIT/ML injection Inject 0-6 Units into the skin every 4 (four) hours. Patient not taking: Reported on 04/03/2022 10/25/21   Shelly Coss, MD  insulin glargine-yfgn (SEMGLEE) 100 UNIT/ML Pen Inject 5 Units into the skin 2 (two) times daily. 03/15/22   [provider]  Insulin Pen Needle 32G X 4 MM MISC 1 Device by Does not apply route as directed. 05/13/20   Shamleffer, Melanie Crazier, MD  metFORMIN (GLUCOPHAGE) 500 MG tablet Take 500 mg by mouth daily. 03/31/22   [provider]  midodrine (PROAMATINE) 5 MG tablet Take 5 mg by mouth 3 (three) times daily. 03/03/22   [provider]  Multiple Vitamin (MULTIVITAMIN WITH MINERALS) TABS tablet Take 1 tablet by mouth every morning.    [provider]  NON  FORMULARY 30 mLs See admin instructions. ProHeal- 30 ml's, per tube, once a day Patient not taking: Reported on 04/03/2022    [provider]  nutrition supplement, JUVEN, (JUVEN) PACK Place 1 packet into feeding tube 2 (two) times daily between meals. Patient not taking: Reported on 04/03/2022 10/25/21   Shelly Coss, MD  Nutritional Supplements (FEEDING SUPPLEMENT, NEPRO CARB STEADY,) LIQD Place 960 mLs into feeding tube daily. Patient not taking:  Reported on 04/03/2022 10/25/21   Shelly Coss, MD  Nutritional Supplements (FEEDING SUPPLEMENT, PROSOURCE TF,) liquid Place 45 mLs into feeding tube 2 (two) times daily. Patient not taking: Reported on 04/03/2022 10/25/21   Shelly Coss, MD  Nutritional Supplements (NEPRO) LIQD Place 60 mL/hr into feeding tube See admin instructions. 60 ml's/hr, per tube- Start infusion at 1600 and stop at 0800. 16 hours total every shift for nutritional support. Administer 60 ml's/hr via G-Tube continuously with a 30 ml's flush every 2 hours for 16 hours. Patient not taking: Reported on 04/03/2022    [provider]  nystatin (MYCOSTATIN) 100000 UNIT/ML suspension Take 5 mLs (500,000 Units total) by mouth 4 (four) times daily. 10/25/21   Shelly Coss, MD  ondansetron (ZOFRAN) 4 MG tablet Take 4 mg by mouth every 6 (six) hours as needed for nausea/vomiting. 03/02/22   [provider]  oxyCODONE-acetaminophen (PERCOCET) 5-325 MG tablet Take 1 tablet by mouth every 6 (six) hours as needed (for pain). 04/06/22 04/06/23  Pokhrel, Corrie Mckusick, MD  SANTYL 250 UNIT/GM ointment Apply 1 Application topically See admin instructions. Apply to right above-the-knee amputation topically every day shift- for wound    [provider]  sennosides (SENOKOT) 8.8 MG/5ML syrup Place 5 mLs into feeding tube 2 (two) times daily. 10/25/21   Shelly Coss, MD  thiamine 100 MG tablet Place 1 tablet (100 mg total) into feeding tube daily. 10/26/21   Shelly Coss, MD  Water For Irrigation, Sterile (FREE WATER) SOLN Place 30 mLs into feeding tube every 4 (four) hours. 10/25/21   Shelly Coss, MD      Allergies    Morphine, Peanut-containing drug products, Penicillins, and Chocolate    Review of Systems   Review of Systems  Constitutional: Negative.   Skin:  Positive for wound.    Physical Exam Updated Vital Signs BP (!) 195/83 (BP Location: Left Arm)   Pulse 95   Temp 98.9 F (37.2 C)   Resp 19   SpO2  97%  Physical Exam Vitals and nursing note reviewed.  Constitutional:      Appearance: She is not toxic-appearing.     Comments: Chronically ill-appearing  HENT:     Head: Normocephalic and atraumatic.     Nose: Nose normal.     Mouth/Throat:     Mouth: Mucous membranes are moist.     Pharynx: Oropharynx is clear. Uvula midline. No oropharyngeal exudate or posterior oropharyngeal erythema.  Eyes:     General:        Right eye: No discharge.        Left eye: No discharge.     Conjunctiva/sclera: Conjunctivae normal.  Cardiovascular:     Rate and Rhythm: Normal rate and regular rhythm.     Pulses:          Dorsalis pedis pulses are 1+ on the left side. Right dorsalis pedis pulse not accessible.        Right posterior tibial pulse not accessible.  Pulmonary:     Effort: Pulmonary effort is normal. No tachypnea,  bradypnea, accessory muscle usage, prolonged expiration or respiratory distress.     Breath sounds: Normal breath sounds. No wheezing or rales.  Chest:     Chest wall: No mass, lacerations, deformity, swelling, tenderness, crepitus or edema.  Abdominal:     General: Bowel sounds are normal. There is no distension.     Tenderness: There is no abdominal tenderness.  Musculoskeletal:        General: No deformity.     Cervical back: Normal range of motion and neck supple.     Left lower leg: No edema.       Legs:     Comments: Right BKA.  Skin:    General: Skin is warm and dry.       Neurological:     Mental Status: She is alert. Mental status is at baseline.  Psychiatric:        Mood and Affect: Mood normal.     ED Results / Procedures / Treatments   Labs (all labs ordered are listed, but only abnormal results are displayed) Labs Reviewed  COMPREHENSIVE METABOLIC PANEL - Abnormal; Notable for the following components:      Result Value   Chloride 94 (*)    Glucose, Bld 185 (*)    BUN 31 (*)    Creatinine, Ser 4.68 (*)    Albumin 2.7 (*)    GFR, Estimated 11  (*)    Anion gap 20 (*)    All other components within normal limits  CBC WITH DIFFERENTIAL/PLATELET - Abnormal; Notable for the following components:   Hemoglobin 11.8 (*)    MCH 25.8 (*)    RDW 19.9 (*)    All other components within normal limits  LACTIC ACID, PLASMA - Abnormal; Notable for the following components:   Lactic Acid, Venous 3.5 (*)    All other components within normal limits  C-REACTIVE PROTEIN - Abnormal; Notable for the following components:   CRP 6.9 (*)    All other components within normal limits  CULTURE, BLOOD (ROUTINE X 2)  CULTURE, BLOOD (ROUTINE X 2)  SEDIMENTATION RATE  LACTIC ACID, PLASMA  I-STAT BETA HCG BLOOD, ED ([facility], [facility], [facility] ONLY)    EKG None  Radiology MRI Left foot without contrast  Result Date: 06/27/2022 CLINICAL DATA:  Left heel wound EXAM: MRI OF THE LEFT FOOT WITHOUT CONTRAST TECHNIQUE: Multiplanar, multisequence MR imaging of the left hindfoot was performed. No intravenous contrast was administered. COMPARISON:  X-ray 06/27/2022 FINDINGS: Bones/Joint/Cartilage Extensive findings of acute osteomyelitis involving the calcaneus with erosion of the posterior cortex and extensive bone marrow edema and confluent low T1 signal changes. Abnormal marrow signal extends into the subchondral bone along the anterior margin of the posterior subtalar joint and closely approximates the calcaneocuboid joint (series 8, image 12). There is presumed packing material at the posterior aspect of the calcaneus. Trace tibiotalar and posterior subtalar joint effusions, favored reactive. No additional sites of acute osteomyelitis are identified. Partially imaged prior second ray resection. No acute fracture or dislocation. Ligaments Intact medial and lateral ankle ligaments. Intact Lisfranc ligament. Muscles and Tendons Distal Achilles tendinosis. The Achilles tendon inserts onto abnormal, partially eroded posterior calcaneus. Findings of denervation or myositis involving the  lower leg and foot musculature. No tenosynovitis. Soft tissues Large soft tissue wound at the heel. No organized or drainable fluid collections. IMPRESSION: 1. Large soft tissue wound at the heel with extensive findings of acute osteomyelitis involving the calcaneus. 2. Trace tibiotalar and posterior subtalar joint effusions,  favored reactive. 3. Distal Achilles tendinosis. The Achilles tendon inserts onto abnormal, partially eroded posterior calcaneus. 4. Findings of denervation or myositis involving the lower leg and foot musculature. Electronically Signed   By: Davina Poke D.O.   On: 06/27/2022 20:18   DG Foot Complete Left  Result Date: 06/27/2022 CLINICAL DATA:  Diabetes, cellulitis, left heel wound EXAM: LEFT FOOT - COMPLETE 3+ VIEW COMPARISON:  04/02/2022 FINDINGS: Frontal, oblique, and lateral views of the left foot are obtained. Bones are severely osteopenic. Prior second digit amputation at the level of the metatarsal. There is a large soft tissue defect within the hindfoot, which exposes the underlying dorsal cortex of the calcaneus. Next lucency and sclerosis of the dorsal calcaneus consistent with osteomyelitis. 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 Diffuse atherosclerosis. IMPRESSION: 1. Large soft tissue defect overlying the calcaneus, with mottled lucency and sclerosis of the underlying dorsal margin of the calcaneus compatible with osteomyelitis. 2. Diffuse osteopenia. Electronically Signed   By: Randa Ngo M.D.   On: 06/27/2022 19:24    Procedures .Critical Care  Performed by: Emeline Darling, PA-C Authorized by: Emeline Darling, PA-C   Critical care provider statement:    Critical care time (minutes):  45   Critical care was time spent personally by me on the following activities:  Development of treatment plan with patient or surrogate, discussions with consultants, evaluation  of patient's response to treatment, examination of patient, obtaining history from patient or surrogate, ordering and performing treatments and interventions, ordering and review of laboratory studies, ordering and review of radiographic studies, pulse oximetry and re-evaluation of patient's condition    Medications Ordered in ED Medications - No data to display  ED Course/ Medical Decision Making/ A&P                           Medical Decision Making 41 year old female with history of diabetes and chronic left foot wound presents with worsening of foot wound and new infection.  Hypertensive on taking vitals without at home.  Cardiopulmonary abdominal signs overnight.  Patient is with 1+ pedal pulses on the left and extensive wound to the left hand calcaneus as above.  DDx includes limited to decubitus wound, cellulitis, osteomyelitis, gangrene.  Amount and/or Complexity of Data Reviewed Labs: ordered.    Details: CBC without leukocytosis but with anemia 11.8 at patient's baseline.  CMP with baseline creatinine of 4.6 consistent with ESRD.  Lactic acid significantly elevated to 3.5.  Sed rate normal, CRP elevated to 6.9, patient is not pregnant.  Radiology:     Details:  Plain film of the left foot with findings concerning for osteomyelitis of the calcaneus.  MRI of the left foot was obtained as ordered from triage and revealed extensive findings consistent with acute osteomyelitis of the calcaneus as well as reactive tibiotalar and posterior subtalar joint effusions and myositis of the lower leg and foot musculature.   Risk Decision regarding hospitalization.    Patient's vital signs remained very reassuring at this time.  Given she is not septic, has history of diabetes and chronic wound, will hold off on antibiotics at this time pending likely biopsy in the inpatient setting.  Patient will require admission to the hospital given extent of osteomyelitis of the foot which is new since  her admission in August of this year for the same wound at which time she did not have  osteo.  Consult called from hospitalist NP [place] (Dr. [facility]) who is agreeable to admitting this patient to her service. I appreciate her collaboration in the care of this patient.   [name]  voiced understanding of her medical evaluation and treatment plan. Each of their questions answered to their expressed satisfaction. She is amenable to plan for admission at this time.  This chart was dictated using voice recognition software, Dragon. Despite the best efforts of this provider to proofread and correct errors, errors may still occur which can change documentation meaning.   Final Clinical Impression(s) / ED Diagnoses Final diagnoses:  Other acute osteomyelitis of left foot [facility])    Rx / DC Orders ED Discharge Orders     None         Emeline Darling, PA-C 06/28/22 0710    Fatima Blank, MD 06/28/22 [id number]

## 2022-06-28 NOTE — ED Notes (Signed)
Pt was brought back to triage d/t concerns pending blood work. Pt currently in triage room 2, phlebotomy at bedside

## 2022-06-28 NOTE — ED Notes (Signed)
ED TO INPATIENT HANDOFF REPORT  ED Nurse Name and Phone #: Delana Meyer Joelie Schou [phone]  S Name/Age/Gender [name] 41 y.o. female Room/Bed: 022C/022C  Code Status   Code Status: Full Code  Home/SNF/Other Skilled nursing facility Patient oriented to: self, place, time, situation Is this baseline? Yes   Triage Complete: Triage complete  Chief Complaint Osteomyelitis of ankle or foot, acute, left ([place]) [M86.172]  Triage Note Pt. Sent from [facility] for a lt. Heel wound.  Wound was cleaned and debrided from the wound doctor at the facility and the wound is down to the bone    Allergies Allergies  Allergen Reactions   Morphine Rash and Other (See Comments)   Peanut-Containing Drug Products Anaphylaxis and Hives   Penicillins Rash and Other (See Comments)    Rash in 2008.  Tolerated cefazolin in 2020   Chocolate Hives    Level of Care/Admitting Diagnosis ED Disposition     ED Disposition  Admit   Condition  --   [place]: [facility] [[id number]]  Level of Care: Med-Surg [16]  May admit patient to [facility] or [facility] if equivalent level of care is available:: No  Covid Evaluation: Asymptomatic - no recent exposure (last 10 days) testing not required  Diagnosis: Osteomyelitis of ankle or foot, acute, left [facility]) [[id number]]  Admitting Physician: Manfred Shirts  Attending Physician: Samella Parr [[id number]]  Certification:: I certify this patient will need inpatient services for at least 2 midnights  Estimated Length of Stay: 7          B Medical/Surgery History Past Medical History:  Diagnosis Date   Anemia    Anxiety    Blind right eye 2008   Diabetes mellitus without complication ([place])    Embolic stroke ([place])    ESRD (end stage renal disease) ([place])    HD on M/W/F   ESRD on hemodialysis ([facility] [place])    GBS (Guillain Barre syndrome) ([place])    Past Surgical History:  Procedure Laterality Date   AMPUTATION Right  08/11/2021   Procedure: TRANSMETATARSAL AMPUTATION OF RIGHT FOOT;  Surgeon: Serafina Mitchell, MD;  Location: [place];  Service: Vascular;  Laterality: Right;   AMPUTATION Right 09/17/2021   Procedure: AMPUTATION RIGHT BELOW KNEE;  Surgeon: Serafina Mitchell, MD;  Location: [place];  Service: Vascular;  Laterality: Right;   AMPUTATION Right 10/11/2021   Procedure: RIGHT ABOVE KNEE AMPUTATION;  Surgeon: Serafina Mitchell, MD;  Location: [place];  Service: Vascular;  Laterality: Right;   AMPUTATION TOE Left    APPLICATION OF WOUND VAC  08/16/2021   Procedure: APPLICATION OF WOUND VAC;  Surgeon: Serafina Mitchell, MD;  Location: [place];  Service: Vascular;;   AV FISTULA PLACEMENT     BUBBLE STUDY  10/02/2021   Procedure: BUBBLE STUDY;  Surgeon: Fay Records, MD;  Location: [place];  Service: Cardiovascular;;   CESAREAN SECTION  2011   FISTULA SUPERFICIALIZATION Right 27/25/3664   Procedure: 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;  Surgeon: Angelia Mould, MD;  Location: [place];  Service: Vascular;  Laterality: Right;   INSERTION OF DIALYSIS CATHETER Left 07/27/2019   Procedure: INSERTION OF TUNNELED  DIALYSIS CATHETER;  Surgeon: Angelia Mould, MD;  Location: [place];  Service: Vascular;  Laterality: Left;   INSERTION OF DIALYSIS CATHETER Left 10/23/2021   Procedure: INSERTION OF TUNNELED PALINDROME PRECISION DIALYSIS CATHETER (23cm);  Surgeon: [place] Heck, MD;  Location: [facility];  Service: Vascular;  Laterality: Left;   IR FLUORO GUIDE CV LINE LEFT  10/16/2021   IR FLUORO GUIDE CV LINE RIGHT  10/11/2021   IR GASTROSTOMY TUBE MOD SED  10/30/2021   IR GASTROSTOMY TUBE REMOVAL  05/08/2022   IR US GUIDE VASC ACCESS LEFT  10/16/2021   IR US GUIDE VASC ACCESS RIGHT  10/11/2021   LOWER EXTREMITY ANGIOGRAPHY N/A 08/11/2021   Procedure: LOWER EXTREMITY ANGIOGRAPHY;  Surgeon: Serafina Mitchell, MD;  Location: [facility];  Service: Cardiovascular;  Laterality: N/A;   REVISON OF  ARTERIOVENOUS FISTULA Right 10/23/2021   Procedure: REVISON OF ARTERIOVENOUS FISTULA  ARM AND PLICATION;  Surgeon: [place] Heck, MD;  Location: [place];  Service: Vascular;  Laterality: Right;   TEE WITHOUT CARDIOVERSION N/A 10/02/2021   Procedure: TRANSESOPHAGEAL ECHOCARDIOGRAM (TEE);  Surgeon: Fay Records, MD;  Location: [facility] END[facility];  Service: Cardiovascular;  Laterality: N/A;   TRANSMETATARSAL AMPUTATION Right 08/16/2021   Procedure: CLOSURE OF RIGHT TRANSMETATARSAL AMPUTATION;  Surgeon: Serafina Mitchell, MD;  Location: [facility] OR;  Service: Vascular;  Laterality: Right;     A IV Location/Drains/Wounds Patient Lines/Drains/Airways Status     Active Line/Drains/Airways     Name Placement date Placement time Site Days   Peripheral IV 06/28/22 18 G 2.5" Anterior;Left;Upper Arm 06/28/22  0608  Arm  less than 1   Fistula / Graft Right Upper arm 02/09/22  2346  Upper arm  139   Gastrostomy/Enterostomy Gastrostomy 20 Fr. LUQ 10/30/21  1550  LUQ  241   Pressure Injury 09/24/21 Sacrum Right;Left;Medial Stage 2 -  Partial thickness loss of dermis presenting as a shallow open injury with a red, pink wound bed without slough. 09/24/21  1600  -- 277   Pressure Injury 02/10/22 Buttocks Left Stage 2 -  Partial thickness loss of dermis presenting as a shallow open injury with a red, pink wound bed without slough. 02/10/22  0500  -- 138   Pressure Injury 04/03/22 Knee Posterior;Right Stage 3 -  Full thickness tissue loss. Subcutaneous fat may be visible but bone, tendon or muscle are NOT exposed. 04/03/22  1936  -- 86   Pressure Injury 04/02/22 Heel Right 04/02/22  1900  -- 87   Wound / Incision (Open or Dehisced) 09/22/21 (IAD) Incontinence Associated Dermatitis;(MASD) Moisture Associated Skin Damage Buttocks Bilateral satellite lesions around perineum, buttocks, and groin 09/22/21  1901  Buttocks  279   Wound / Incision (Open or Dehisced) 02/10/22 Other (Comment) Buttocks Left PU stage 2. 4 cm 02/10/22   0500  Buttocks  138            Intake/Output Last 24 hours No intake or output data in the 24 hours ending 06/28/22 0950  Labs/Imaging Results for orders placed or performed during the hospital encounter of 06/27/22 (from the past 48 hour(s))  Comprehensive metabolic panel     Status: Abnormal   Collection Time: 06/28/22  2:44 AM  Result Value Ref Range   Sodium 142 135 - 145 mmol/L   Potassium 4.3 3.5 - 5.1 mmol/L   Chloride 94 (L) 98 - 111 mmol/L   CO2 28 22 - 32 mmol/L   Glucose, Bld 185 (H) 70 - 99 mg/dL    Comment: Glucose reference range applies only to samples taken after fasting for at least 8 hours.   BUN 31 (H) 6 - 20 mg/dL   Creatinine, Ser 4.68 (H) 0.44 - 1.00 mg/dL   Calcium 9.3 8.9 -  10.3 mg/dL   Total Protein 7.6 6.5 - 8.1 g/dL   Albumin 2.7 (L) 3.5 - 5.0 g/dL   AST 20 15 - 41 U/L   ALT 10 0 - 44 U/L   Alkaline Phosphatase 101 38 - 126 U/L   Total Bilirubin 0.3 0.3 - 1.2 mg/dL   GFR, Estimated 11 (L) >60 mL/min    Comment: (NOTE) Calculated using the CKD-EPI Creatinine Equation (2021)    Anion gap 20 (H) 5 - 15    Comment: Performed at [facility] [address], [place], [place] [address]  CBC with Differential     Status: Abnormal   Collection Time: 06/28/22  2:44 AM  Result Value Ref Range   WBC 8.0 4.0 - 10.5 K/uL   RBC 4.57 3.87 - 5.11 MIL/uL   Hemoglobin 11.8 (L) 12.0 - 15.0 g/dL   HCT 38.9 36.0 - 46.0 %   MCV 85.1 80.0 - 100.0 fL   MCH 25.8 (L) 26.0 - 34.0 pg   MCHC 30.3 30.0 - 36.0 g/dL   RDW 19.9 (H) 11.5 - 15.5 %   Platelets 185 150 - 400 K/uL   nRBC 0.0 0.0 - 0.2 %   Neutrophils Relative % 66 %   Neutro Abs 5.4 1.7 - 7.7 K/uL   Lymphocytes Relative 21 %   Lymphs Abs 1.7 0.7 - 4.0 K/uL   Monocytes Relative 7 %   Monocytes Absolute 0.5 0.1 - 1.0 K/uL   Eosinophils Relative 5 %   Eosinophils Absolute 0.4 0.0 - 0.5 K/uL   Basophils Relative 1 %   Basophils Absolute 0.1 0.0 - 0.1 K/uL   Immature Granulocytes 0 %   Abs Immature  Granulocytes 0.03 0.00 - 0.07 K/uL    Comment: Performed at [facility], [address], [place], [place] [address]  Lactic acid     Status: Abnormal   Collection Time: 06/28/22  2:44 AM  Result Value Ref Range   Lactic Acid, Venous 3.5 (HH) 0.5 - 1.9 mmol/L    Comment: CRITICAL RESULT CALLED TO, READ BACK BY AND VERIFIED WITH B. OSORIO, RN AT 0335 ON 06/28/22 BY H. HOWARD. Performed at [facility], [place] [address], [place], [place] [address]   Sedimentation rate     Status: None   Collection Time: 06/28/22  2:44 AM  Result Value Ref Range   Sed Rate 11 0 - 22 mm/hr    Comment: Performed at [facility] [address], [place], [place] [address]  C-reactive protein     Status: Abnormal   Collection Time: 06/28/22  2:44 AM  Result Value Ref Range   CRP 6.9 (H) <1.0 mg/dL    Comment: Performed at [facility] [address] [place], [place] [address]  I-Stat beta hCG blood, ED     Status: None   Collection Time: 06/28/22  3:05 AM  Result Value Ref Range   I-stat hCG, quantitative <5.0 <5 mIU/mL   Comment 3            Comment:   GEST. AGE      CONC.  (mIU/mL)   <=1 WEEK        5 - 50     2 WEEKS       50 - 500     3 WEEKS       100 - 10,000     4 WEEKS     1,000 - 30,000  FEMALE AND NON-PREGNANT FEMALE:     LESS THAN 5 mIU/mL   Lactic acid, plasma     Status: Abnormal   Collection Time: 06/28/22  6:25 AM  Result Value Ref Range   Lactic Acid, Venous 3.2 (HH) 0.5 - 1.9 mmol/L    Comment: CRITICAL VALUE NOTED. VALUE IS CONSISTENT WITH PREVIOUSLY REPORTED/CALLED VALUE Performed at [facility], [place] [address] [place], [place] [address]   CBG monitoring, ED     Status: Abnormal   Collection Time: 06/28/22  9:20 AM  Result Value Ref Range   Glucose-Capillary 210 (H) 70 - 99 mg/dL    Comment: Glucose reference range applies only to samples taken after fasting for at least 8 hours.   MRI Left foot without contrast  Result Date:  06/27/2022 CLINICAL DATA:  Left heel wound EXAM: MRI OF THE LEFT FOOT WITHOUT CONTRAST TECHNIQUE: Multiplanar, multisequence MR imaging of the left hindfoot was performed. No intravenous contrast was administered. COMPARISON:  X-ray 06/27/2022 FINDINGS: Bones/Joint/Cartilage Extensive findings of acute osteomyelitis involving the calcaneus with erosion of the posterior cortex and extensive bone marrow edema and confluent low T1 signal changes. Abnormal marrow signal extends into the subchondral bone along the anterior margin of the posterior subtalar joint and closely approximates the calcaneocuboid joint (series 8, image 12). There is presumed packing material at the posterior aspect of the calcaneus. Trace tibiotalar and posterior subtalar joint effusions, favored reactive. No additional sites of acute osteomyelitis are identified. Partially imaged prior second ray resection. No acute fracture or dislocation. Ligaments Intact medial and lateral ankle ligaments. Intact Lisfranc ligament. Muscles and Tendons Distal Achilles tendinosis. The Achilles tendon inserts onto abnormal, partially eroded posterior calcaneus. Findings of denervation or myositis involving the lower leg and foot musculature. No tenosynovitis. Soft tissues Large soft tissue wound at the heel. No organized or drainable fluid collections. IMPRESSION: 1. Large soft tissue wound at the heel with extensive findings of acute osteomyelitis involving the calcaneus. 2. Trace tibiotalar and posterior subtalar joint effusions, favored reactive. 3. Distal Achilles tendinosis. The Achilles tendon inserts onto abnormal, partially eroded posterior calcaneus. 4. Findings of denervation or myositis involving the lower leg and foot musculature. Electronically Signed   By: Davina Poke D.O.   On: 06/27/2022 20:18   DG Foot Complete Left  Result Date: 06/27/2022 CLINICAL DATA:  Diabetes, cellulitis, left heel wound EXAM: LEFT FOOT - COMPLETE 3+ VIEW  COMPARISON:  04/02/2022 FINDINGS: Frontal, oblique, and lateral views of the left foot are obtained. Bones are severely osteopenic. Prior second digit amputation at the level of the metatarsal. There is a large soft tissue defect within the hindfoot, which exposes the underlying dorsal cortex of the calcaneus. Next lucency and sclerosis of the dorsal calcaneus consistent with osteomyelitis. 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 Diffuse atherosclerosis. IMPRESSION: 1. Large soft tissue defect overlying the calcaneus, with mottled lucency and sclerosis of the underlying dorsal margin of the calcaneus compatible with osteomyelitis. 2. Diffuse osteopenia. Electronically Signed   By: Randa Ngo M.D.   On: 06/27/2022 19:24    Pending Labs Unresulted Labs (From admission, onward)     Start     Ordered   06/29/22 0500  Comprehensive metabolic panel  Tomorrow morning,   R        06/28/22 0724   06/29/22 0500  CBC  Tomorrow morning,   R        06/28/22 3810  06/28/22 0800  Hemoglobin A1c  Once,   R       Comments: To assess prior glycemic control    06/28/22 0800   06/28/22 0724  Magnesium  Once,   R        06/28/22 0724   06/28/22 0724  Phosphorus  Once,   R        06/28/22 0724   06/28/22 0724  APTT  Once,   R        06/28/22 0724   06/28/22 0724  Protime-INR  Once,   R        06/28/22 0724   06/28/22 0722  CBC  (heparin)  Once,   R       Comments: Baseline for heparin therapy IF NOT ALREADY DRAWN.  Notify MD if PLT < 100 K.    06/28/22 0724   06/28/22 0722  Creatinine, serum  (heparin)  Once,   R       Comments: Baseline for heparin therapy IF NOT ALREADY DRAWN.    06/28/22 0724   06/27/22 1824  Blood Cultures x 2 sites  BLOOD CULTURE X 2,   STAT      06/27/22 1826            Vitals/Pain Today's Vitals   06/28/22 0410 06/28/22 0615 06/28/22 0900 06/28/22 0915  BP: (!) 195/83  (!) 117/56 (!)  124/57  Pulse: 86 95    Resp: 18 19 14 13   Temp: 98.9 F (37.2 C)     TempSrc:      SpO2: 98% 97%    PainSc:        Isolation Precautions No active isolations  Medications Medications  heparin injection 5,000 Units (5,000 Units Subcutaneous Given 06/28/22 0929)  sodium chloride flush (NS) 0.9 % injection 3 mL (has no administration in time range)  sodium chloride flush (NS) 0.9 % injection 3 mL (has no administration in time range)  0.9 %  sodium chloride infusion (has no administration in time range)  acetaminophen (TYLENOL) tablet 650 mg (has no administration in time range)    Or  acetaminophen (TYLENOL) suppository 650 mg (has no administration in time range)  HYDROmorphone (DILAUDID) injection 0.5-1 mg (has no administration in time range)  oxyCODONE (Oxy IR/ROXICODONE) immediate release tablet 5 mg (has no administration in time range)  ondansetron (ZOFRAN) tablet 4 mg (has no administration in time range)    Or  ondansetron (ZOFRAN) injection 4 mg (has no administration in time range)  atorvastatin (LIPITOR) tablet 40 mg (has no administration in time range)  midodrine (PROAMATINE) tablet 5 mg (has no administration in time range)  cinacalcet (SENSIPAR) tablet 90 mg (has no administration in time range)  insulin glargine-yfgn (SEMGLEE) Pen 5 Units (has no administration in time range)  metFORMIN (GLUCOPHAGE) tablet 500 mg (has no administration in time range)  sennosides (SENOKOT) 8.8 MG/5ML syrup 5 mL (has no administration in time range)  epoetin alfa-epbx (RETACRIT) injection 10,000 Units (has no administration in time range)  multivitamin with minerals tablet 1 tablet (has no administration in time range)  thiamine (VITAMIN B1) tablet 100 mg (has no administration in time range)  dorzolamide-timolol (COSOPT) 2-0.5 % ophthalmic solution 1 drop (has no administration in time range)  nystatin (MYCOSTATIN) 100000 UNIT/ML suspension 500,000 Units (has no administration in  time range)  leptospermum manuka honey (MEDIHONEY) paste 1 Application (has no administration in time range)  insulin aspart (novoLOG) injection 0-6 Units (2 Units Subcutaneous Given 06/28/22  0930)  insulin aspart (novoLOG) injection 0-5 Units (has no administration in time range)    Mobility manual wheelchair Low fall risk   Focused Assessments Cardiac Assessment Handoff:    No results found for: "CKTOTAL", "CKMB", "CKMBINDEX", "TROPONINI" Lab Results  Component Value Date   DDIMER 1.22 (H) 02/22/2019   Does the Patient currently have chest pain? No    R Recommendations: See Admitting Provider Note  Report given to: Matt Holmes  Additional Notes: pending labs unsuccessful attempts

## 2022-06-28 NOTE — H&P (Signed)
History and Physical    Patient: [name] OAC:[id number] DOB: Nov 06, 1980 DOA: 06/27/2022 DOS: the patient was seen and examined on 06/28/2022 PCP: [facility]  Patient coming from: [facility]  Chief Complaint:  Chief Complaint  Patient presents with   Wound Check   HPI: [name] is a 41 y.o. 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  She is also on chronic dialysis every Monday Wednesday Friday, she has peripheral vascular disease and anemia and chronic kidney disease as well as secondary hyperparathyroidism.  Patient has a known chronic left heel wound.  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  Patient was sent from the nursing facility because of increasing pain and drainage from the left heel wound.  She had been placed on doxycycline for the past 2 days without any improvement in symptoms.  In the ER was afebrile, mildly hypertensive in the context of significant pain, and stable on room air.  She did not have any leukocytosis.  Renal function at baseline, CRP elevated at 6.9, lactic acid 3.5 and 3.2 in the context of chronic dialysis.  MRI left foot revealed large soft tissue wound at the heel with extensive findings of acute osteomyelitis involving the calcaneus there was also some distal Achilles tendinosis.  Upon my evaluation the patient was complaining of hunger as well as significant pain in the left foot.  Due to extensive pain and the fact the wound had just been dressed I did not remove the dressing but it is clearly draining as drainage is noted on the dressing already.  Be admitted as an inpatient for acute osteomyelitis.  Nephrology and VVS will be consulted.  Review  of Systems: As mentioned in the history of present illness. All other systems reviewed and are negative. Past Medical History:  Diagnosis Date   Anemia    Anxiety    Blind right eye 2008   Diabetes mellitus without complication ([place])    Embolic stroke ([place])    ESRD (end stage renal disease) ([place])    HD on M/W/F   ESRD on hemodialysis ([place])    GBS (Guillain Barre syndrome) ([place])    Past Surgical History:  Procedure Laterality Date   AMPUTATION Right 08/11/2021   Procedure: TRANSMETATARSAL AMPUTATION OF RIGHT FOOT;  Surgeon: Serafina Mitchell, MD;  Location: [place];  Service: Vascular;  Laterality: Right;   AMPUTATION Right 09/17/2021   Procedure: AMPUTATION RIGHT BELOW KNEE;  Surgeon: Serafina Mitchell, MD;  Location: [place];  Service: Vascular;  Laterality: Right;   AMPUTATION Right 10/11/2021   Procedure: RIGHT ABOVE KNEE AMPUTATION;  Surgeon: Serafina Mitchell, MD;  Location: [place];  Service: Vascular;  Laterality: Right;   AMPUTATION TOE Left    APPLICATION OF WOUND VAC  08/16/2021   Procedure: APPLICATION OF WOUND VAC;  Surgeon: Serafina Mitchell, MD;  Location: [place];  Service: Vascular;;   AV FISTULA PLACEMENT     BUBBLE STUDY  10/02/2021   Procedure: BUBBLE STUDY;  Surgeon: Fay Records, MD;  Location: [place];  Service: Cardiovascular;;   CESAREAN SECTION  2011   FISTULA SUPERFICIALIZATION Right 08/21/3233   Procedure: 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;  Surgeon: Scot Dock,  Judeth Cornfield, MD;  Location: [place];  Service: Vascular;  Laterality: Right;   INSERTION OF DIALYSIS CATHETER Left 07/27/2019   Procedure: INSERTION OF TUNNELED  DIALYSIS CATHETER;  Surgeon: Angelia Mould, MD;  Location[facility] OR;  Service: Vascular;  Laterality: Left;   INSERTION OF DIALYSIS CATHETER Left 10/23/2021   Procedure: INSERTION OF TUNNELED PALINDROME PRECISION DIALYSIS CATHETER (23cm);  Surgeon: Marty Heck, MD;  Location: [facility] OR;  Service: Vascular;  Laterality: Left;    IR FLUORO GUIDE CV LINE LEFT  10/16/2021   IR FLUORO GUIDE CV LINE RIGHT  10/11/2021   IR GASTROSTOMY TUBE MOD SED  10/30/2021   IR GASTROSTOMY TUBE REMOVAL  05/08/2022   IR US GUIDE VASC ACCESS LEFT  10/16/2021   IR US GUIDE VASC ACCESS RIGHT  10/11/2021   LOWER EXTREMITY ANGIOGRAPHY N/A 08/11/2021   Procedure: LOWER EXTREMITY ANGIOGRAPHY;  Surgeon: Serafina Mitchell, MD;  Location: [place] CV LAB;  Service: Cardiovascular;  Laterality: N/A;   REVISON OF ARTERIOVENOUS FISTULA Right 10/23/2021   Procedure: REVISON OF ARTERIOVENOUS FISTULA  ARM AND PLICATION;  Surgeon: Marty Heck, MD;  Location: [place];  Service: Vascular;  Laterality: Right;   TEE WITHOUT CARDIOVERSION N/A 10/02/2021   Procedure: TRANSESOPHAGEAL ECHOCARDIOGRAM (TEE);  Surgeon: Fay Records, MD;  Location: [facility] ENDOSCOPY;  Service: Cardiovascular;  Laterality: N/A;   TRANSMETATARSAL AMPUTATION Right 08/16/2021   Procedure: CLOSURE OF RIGHT TRANSMETATARSAL AMPUTATION;  Surgeon: Serafina Mitchell, MD;  Location: [place];  Service: Vascular;  Laterality: Right;   Social History:  reports that she has never smoked. She has never used smokeless tobacco. She reports that she does not drink alcohol and does not use drugs.  Allergies  Allergen Reactions   Morphine Rash and Other (See Comments)   Peanut-Containing Drug Products Anaphylaxis and Hives   Penicillins Rash and Other (See Comments)    Rash in 2008.  Tolerated cefazolin in 2020   Chocolate Hives    Family History  Problem Relation Age of Onset   Diabetes Mother    Diabetes Father     Prior to Admission medications   Medication Sig Start Date End Date Taking? Authorizing Provider  Accu-Chek FastClix Lancets MISC Use as instructed to check blood sugar up to TID. E11.22 Patient taking differently: 1 each by Other route See admin instructions. Use as instructed to check blood sugar up to TID. E11.22 04/15/19   Charlott Rakes, MD  atorvastatin (LIPITOR) 40 MG tablet Place  1 tablet (40 mg total) into feeding tube daily. Patient taking differently: Take 40 mg by mouth daily. 10/26/21   Shelly Coss, MD  Blood Glucose Monitoring Suppl (ACCU-CHEK GUIDE ME) w/Device KIT 1 kit by Does not apply route 3 (three) times daily. Use to check BG at home up to 3 times daily. E11.22 04/15/19   Charlott Rakes, MD  cinacalcet (SENSIPAR) 90 MG tablet Take 90 mg by mouth daily. 09/22/19   [provider]  Continuous Blood Gluc Transmit (DEXCOM G6 TRANSMITTER) MISC 1 Device by Does not apply route as directed. 09/12/20   Shamleffer, Melanie Crazier, MD  dorzolamide-timolol (COSOPT) 22.3-6.8 MG/ML ophthalmic solution Place 1 drop into the left eye 2 (two) times daily. 03/30/21   [provider]  epoetin alfa-epbx (RETACRIT) 60737 UNIT/ML injection Inject 10,000 Units into the vein See admin instructions. 10,000 units IM every Mon/Wed/Fri with dialysis    [provider]  glucose blood (ACCU-CHEK GUIDE) test strip Use as instructed to check  blood sugar up to TID. E11.22 Patient taking differently: 1 each by Other route See admin instructions. Use as instructed to check blood sugar up to TID. E11.22 07/17/19   Fulp, Cammie, MD  insulin aspart (NOVOLOG) 100 UNIT/ML injection Inject 0-6 Units into the skin every 4 (four) hours. Patient not taking: Reported on 04/03/2022 10/25/21   Shelly Coss, MD  insulin glargine-yfgn (SEMGLEE) 100 UNIT/ML Pen Inject 5 Units into the skin 2 (two) times daily. 03/15/22   [provider]  Insulin Pen Needle 32G X 4 MM MISC 1 Device by Does not apply route as directed. 05/13/20   Shamleffer, Melanie Crazier, MD  metFORMIN (GLUCOPHAGE) 500 MG tablet Take 500 mg by mouth daily. 03/31/22   [provider]  midodrine (PROAMATINE) 5 MG tablet Take 5 mg by mouth 3 (three) times daily. 03/03/22   [provider]  Multiple Vitamin (MULTIVITAMIN WITH MINERALS) TABS tablet Take 1 tablet by mouth every morning.    [provider]  NON FORMULARY 30 mLs See admin instructions. ProHeal- 30 ml's, per tube, once a day Patient not taking: Reported on 04/03/2022    [provider]  nutrition supplement, JUVEN, (JUVEN) PACK Place 1 packet into feeding tube 2 (two) times daily between meals. Patient not taking: Reported on 04/03/2022 10/25/21   Shelly Coss, MD  Nutritional Supplements (FEEDING SUPPLEMENT, NEPRO CARB STEADY,) LIQD Place 960 mLs into feeding tube daily. Patient not taking: Reported on 04/03/2022 10/25/21   Shelly Coss, MD  Nutritional Supplements (FEEDING SUPPLEMENT, PROSOURCE TF,) liquid Place 45 mLs into feeding tube 2 (two) times daily. Patient not taking: Reported on 04/03/2022 10/25/21   Shelly Coss, MD  Nutritional Supplements (NEPRO) LIQD Place 60 mL/hr into feeding tube See admin instructions. 60 ml's/hr, per tube- Start infusion at 1600 and stop at 0800. 16 hours total every shift for nutritional support. Administer 60 ml's/hr via G-Tube continuously with a 30 ml's flush every 2 hours for 16 hours. Patient not taking: Reported on 04/03/2022    [provider]  nystatin (MYCOSTATIN) 100000 UNIT/ML suspension Take 5 mLs (500,000 Units total) by mouth 4 (four) times daily. 10/25/21   Shelly Coss, MD  ondansetron (ZOFRAN) 4 MG tablet Take 4 mg by mouth every 6 (six) hours as needed for nausea/vomiting. 03/02/22   [provider]  oxyCODONE-acetaminophen (PERCOCET) 5-325 MG tablet Take 1 tablet by mouth every 6 (six) hours as needed (for pain). 04/06/22 04/06/23  Pokhrel, Corrie Mckusick, MD  SANTYL 250 UNIT/GM ointment Apply 1 Application topically See admin instructions. Apply to right above-the-knee amputation topically every day shift- for wound    [provider]  sennosides (SENOKOT) 8.8 MG/5ML syrup Place 5 mLs into feeding tube 2 (two) times daily. 10/25/21   Shelly Coss, MD  thiamine 100 MG tablet Place 1 tablet (100 mg total) into feeding tube daily. 10/26/21    Shelly Coss, MD  Water For Irrigation, Sterile (FREE WATER) SOLN Place 30 mLs into feeding tube every 4 (four) hours. 10/25/21   Shelly Coss, MD    Physical Exam: Vitals:   06/28/22 0108 06/28/22 0132 06/28/22 0410 06/28/22 0615  BP: (!) 160/76  (!) 195/83   Pulse: 79  86 95  Resp: _0 Temp:  (!) 97 F (36.1 C) 98.9 F (37.2 C)   TempSrc:      SpO2: 95%  98% 97%   Constitutional: NAD, calm, comfortable Respiratory: clear to auscultation bilaterally, no wheezing, no crackles. Normal respiratory  effort. No accessory muscle use.  Cardiovascular: Regular rate and rhythm, no murmurs / rubs / gallops. No extremity edema. 2+ pedal pulses. No carotid bruits.  Abdomen: no tenderness, no masses palpated. No hepatosplenomegaly. Bowel sounds positive.  Musculoskeletal: Right BKA site stable, no obvious contractures.  Dressing over left foot with drainage noted coming through the dressing on the heel. Skin: no rashes, lesions, ulcers. No induration-Chane not removed to fully examine or photograph the left heel. Neurologic: CN 2-12 grossly intact. Sensation intact, Strength 5/5 x all 4 extremities.  Will to reposition self in the bed.  At baseline is nonambulatory and bedbound secondary to chronic foot wound as well as BKA. Psychiatric: Normal judgment and insight. Alert and oriented x 3. Normal mood.   Data Reviewed:  Laboratory data: Sodium 142, potassium 4.3, CO2 28, glucose 185, BUN 31, creatinine 4.68, anion gap 20, albumin 2.7, CRP and lactic acid as above, white count 8000 without left shift, hemoglobin 11.8, platelets 185,000, ESR 11 i-STAT hCG <5 Blood cultures are pending  Diagnostic data: MRI of left foot as above in HPI  Assessment and Plan: Chronic left heel wound now with acute osteomyelitis Patient with worsening of chronic left heel wound now with evidence of acute osteomyelitis and drainage Previous arteriogram in 2022 shows essentially no flow beginning at the  posterior tibialis artery-it is highly doubtful that this wound will heal VVS has been consulted-anticipate probable amputation this admission Patient not septic and previous cultures have been unremarkable-as a precaution we will begin empiric antibiotics with IV Levaquin and vancomycin IV Dilaudid for pain  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 MWF Lyses at [facility] Nephrology notified of admission Currently not volume overloaded Treatment of anemia of CKD as well as secondary hyperparathyroidism per nephrology team-resume home medications as appropriate (Sensipar and erythropoietin) Continue midodrine  Diabetes mellitus on insulin Hyperglycemic at this time Continue low-dose Semglee and metformin  Hypertension Controlled with volume management/hemodialysis  PVD history of prior right BKA Vascular team consulted  History of CVA Continue secondary prevention with Lipitor   Advance Care Planning:   Code Status: Full Code   VTE prophylaxis: Subcutaneous heparin  Consults: Nephrology and vascular surgery  Family Communication: Patient only  Severity of Illness: The appropriate patient status for this patient is INPATIENT. Inpatient status is judged to be reasonable and necessary in order to provide the required intensity of service to ensure the patient's safety. The patient's presenting symptoms, physical exam findings, and initial radiographic and laboratory data in the context of their chronic comorbidities is felt to place them at high risk for further clinical deterioration. Furthermore, it is not anticipated that the patient will be medically stable for discharge from the hospital within 2 midnights of admission.   * I certify that at the point of admission it is my clinical judgment that the patient will require inpatient hospital care spanning beyond 2 midnights from the point of admission due to high intensity of service, high risk for further deterioration and high frequency of  surveillance required.*  Author: Erin Hearing, NP 06/28/2022 8:39 AM  For on call review [email or web address].

## 2022-06-28 NOTE — Progress Notes (Signed)
Pharmacy Antibiotic Note  [name] is a 42 y.o. female admitted on 06/27/2022 with  wound infection . Pharmacy has been consulted for vancomycin and cefepime dosing.  Plan: Vancomycin 1000 mg IV x1, follow-up subsequent doses once HD timing established Cefepime 1g Q24h, can transition to with HD once HD timing established Trend WBC, fever, renal function F/u cultures, clinical progress, levels as indicated De-escalate when able   Weight: 52.7 kg (116 lb 2.9 oz)  Temp (24hrs), Avg:97.9 F (36.6 C), Min:97 F (36.1 C), Max:98.9 F (37.2 C)  Recent Labs  Lab 06/28/22 0244 06/28/22 0625  WBC 8.0  --   CREATININE 4.68*  --   LATICACIDVEN 3.5* 3.2*    Estimated Creatinine Clearance: 12.1 mL/min (A) (by C-G formula based on SCr of 4.68 mg/dL (H)).    Allergies  Allergen Reactions   Morphine Rash and Other (See Comments)   Peanut-Containing Drug Products Anaphylaxis and Hives   Penicillins Rash and Other (See Comments)    Rash in 2008.  Tolerated cefazolin in 2020   Chocolate Hives    Thank you for allowing pharmacy to be a part of this patient's care.  Ardyth Harps, PharmD Clinical Pharmacist

## 2022-06-29 DIAGNOSIS — N186 End stage renal disease: Secondary | ICD-10-CM | POA: Diagnosis not present

## 2022-06-29 DIAGNOSIS — Z992 Dependence on renal dialysis: Secondary | ICD-10-CM | POA: Diagnosis not present

## 2022-06-29 DIAGNOSIS — M86172 Other acute osteomyelitis, left ankle and foot: Secondary | ICD-10-CM | POA: Diagnosis not present

## 2022-06-29 LAB — COMPREHENSIVE METABOLIC PANEL
ALT: 9 U/L (ref 0–44)
AST: 15 U/L (ref 15–41)
Albumin: 2.4 g/dL — ABNORMAL LOW (ref 3.5–5.0)
Alkaline Phosphatase: 92 U/L (ref 38–126)
Anion gap: 14 (ref 5–15)
BUN: 53 mg/dL — ABNORMAL HIGH (ref 6–20)
CO2: 31 mmol/L (ref 22–32)
Calcium: 9.3 mg/dL (ref 8.9–10.3)
Chloride: 92 mmol/L — ABNORMAL LOW (ref 98–111)
Creatinine, Ser: 6.8 mg/dL — ABNORMAL HIGH (ref 0.44–1.00)
GFR, Estimated: 7 mL/min — ABNORMAL LOW (ref >60)
Glucose, Bld: 83 mg/dL (ref 70–99)
Potassium: 4.9 mmol/L (ref 3.5–5.1)
Sodium: 137 mmol/L (ref 135–145)
Total Bilirubin: 0.4 mg/dL (ref 0.3–1.2)
Total Protein: 6.8 g/dL (ref 6.5–8.1)

## 2022-06-29 LAB — HEPATITIS B SURFACE ANTIBODY, QUANTITATIVE: Hep B S AB Quant (Post): >1000 m[IU]/mL (ref >9.9)

## 2022-06-29 LAB — CBC
HCT: 35.1 % — ABNORMAL LOW (ref 36.0–46.0)
Hemoglobin: 11.1 g/dL — ABNORMAL LOW (ref 12.0–15.0)
MCH: 25.8 pg — ABNORMAL LOW (ref 26.0–34.0)
MCHC: 31.6 g/dL (ref 30.0–36.0)
MCV: 81.4 fL (ref 80.0–100.0)
Platelets: 195 10*3/uL (ref 150–400)
RBC: 4.31 MIL/uL (ref 3.87–5.11)
RDW: 19.3 % — ABNORMAL HIGH (ref 11.5–15.5)
WBC: 8.1 10*3/uL (ref 4.0–10.5)
nRBC: 0 % (ref 0.0–0.2)

## 2022-06-29 LAB — GLUCOSE, CAPILLARY
Glucose-Capillary: 62 mg/dL — ABNORMAL LOW (ref 70–99)
Glucose-Capillary: 79 mg/dL (ref 70–99)
Glucose-Capillary: 73 mg/dL (ref 70–99)
Glucose-Capillary: 87 mg/dL (ref 70–99)
Glucose-Capillary: 169 mg/dL — ABNORMAL HIGH (ref 70–99)

## 2022-06-29 MED ORDER — VANCOMYCIN HCL 500 MG/100ML IV SOLN
500.0000 mg | Freq: Once | INTRAVENOUS | Status: AC
  Administered 2022-06-29: 500 mg via INTRAVENOUS
  Filled 2022-06-29: qty 100

## 2022-06-29 NOTE — Plan of Care (Signed)
  Problem: Education: Goal: Ability to describe self-care measures that may prevent or decrease complications (Diabetes Survival Skills Education) will improve Outcome: Not Progressing Goal: Individualized Educational Video(s) Outcome: Progressing   Problem: Coping: Goal: Ability to adjust to condition or change in health will improve Outcome: Progressing   Problem: Fluid Volume: Goal: Ability to maintain a balanced intake and output will improve Outcome: Progressing   Problem: Health Behavior/Discharge Planning: Goal: Ability to identify and utilize available resources and services will improve Outcome: Progressing Goal: Ability to manage health-related needs will improve Outcome: Progressing   Problem: Nutritional: Goal: Maintenance of adequate nutrition will improve Outcome: Progressing Goal: Progress toward achieving an optimal weight will improve Outcome: Progressing   Problem: Skin Integrity: Goal: Risk for impaired skin integrity will decrease Outcome: Progressing   Problem: Education: Goal: Knowledge of General Education information will improve Description: Including pain rating scale, medication(s)/side effects and non-pharmacologic comfort measures Outcome: Progressing   Problem: Health Behavior/Discharge Planning: Goal: Ability to manage health-related needs will improve Outcome: Progressing   Problem: Clinical Measurements: Goal: Ability to maintain clinical measurements within normal limits will improve Outcome: Progressing Goal: Will remain free from infection Outcome: Progressing Goal: Diagnostic test results will improve Outcome: Progressing Goal: Respiratory complications will improve Outcome: Progressing Goal: Cardiovascular complication will be avoided Outcome: Progressing   Problem: Activity: Goal: Risk for activity intolerance will decrease Outcome: Not Progressing   Problem: Nutrition: Goal: Adequate nutrition will be maintained Outcome:  Progressing   Problem: Coping: Goal: Level of anxiety will decrease Outcome: Progressing   Problem: Elimination: Goal: Will not experience complications related to bowel motility Outcome: Progressing Goal: Will not experience complications related to urinary retention Outcome: Progressing   Problem: Pain Managment: Goal: General experience of comfort will improve Outcome: Progressing   Problem: Safety: Goal: Ability to remain free from injury will improve Outcome: Progressing

## 2022-06-29 NOTE — Progress Notes (Signed)
Inpatient Diabetes Program Recommendations  AACE/ADA: New Consensus Statement on Inpatient Glycemic Control (2015)  Target Ranges:  Prepandial:   less than 140 mg/dL      Peak postprandial:   less than 180 mg/dL (1-2 hours)      Critically ill patients:  140 - 180 mg/dL   Lab Results  Component Value Date   GLUCAP 73 06/29/2022   HGBA1C 5.1 06/28/2022    Review of Glycemic Control  Latest Reference Range & Units 06/28/22 21:48 06/29/22 07:57 06/29/22 09:47 06/29/22 11:27  Glucose-Capillary 70 - 99 mg/dL 291 (H) 62 (L) 79 73   Diabetes history: DM /ESRD Outpatient Diabetes medications:  Novolog 0-6 units tid with meals, Semglee 5 units bid Metformin 500 mg daily Current orders for Inpatient glycemic control:  Novolog 0-6 units tid with meals and HS Semglee 5 units bid Inpatient Diabetes Program Recommendations:    Note mild low blood sugars this AM.  May consider d/c of Novolog at HS?    Thanks,  Adah Perl, RN, BC-ADM Inpatient Diabetes Coordinator Pager [phone]  (8a-5p)

## 2022-06-29 NOTE — Progress Notes (Signed)
Received patient in bed to unit.  Alert and oriented.  Informed consent signed and in chart.   Treatment initiated: 1413 Treatment completed: 7215  Patient tolerated well.  Transported back to the room  Alert, without acute distress.  Hand-off given to patient's nurse.   Access used: AVF Access issues: none  Total UF removed: 200 ML Medication(s) given: Midodrine 5 mg PO. Tylenol 650 mg po. Vancomycin 0.5 g IV. Post HD VS: 96/82.P 72. R 16 O2 sat 100 % in room air. Post HD weight: 52.3 KG   [facility]

## 2022-06-29 NOTE — Care Plan (Signed)
CHANGE IN DIALYSIS SCHEDULE DUE TO Jake Shark  Ebone Alcivar 1980-11-24 [id number]  Patient dialysis schedule for the week of 07/01/22-07/08/22 varies from their normal schedule due to Thanksgiving Holiday.  Need to keep this in mind for discharge planning.  The Holiday schedule is as follow:   Normal HD Schedule: Monday Wednesday Friday Thanksgiving schedule:Sunday, Tuesday, Friday  They will resume their normal schedule on 07/09/22.     Jen Mow, PA-C [facility] Pager: [phone]

## 2022-06-29 NOTE — Progress Notes (Addendum)
[facility] Progress Note   Subjective:   Patient seen and examined at bedside.  Reports pain in her foot.  Plan for amputation on Monday.  No other specific complaints.  Denies CP, SOB, abdominal pain and n/v/d.   Objective Vitals:   06/28/22 1956 06/29/22 0009 06/29/22 0500 06/29/22 0759  BP: (!) 156/101 (!) 140/55  (!) 98/49  Pulse: 81 77  77  Resp: 19 19  18   Temp: 98.8 F (37.1 C) 98.2 F (36.8 C)  97.6 F (36.4 C)  TempSrc: Oral Oral  Oral  SpO2: 100% 100%  100%  Weight:   52.5 kg   Height:       Physical Exam General:chronically ill appearing female in NAD Heart:RRR, no mrg Lungs: CTAB, nml WOB on RA Abdomen:soft, NTNF Extremities:R AKA, no LLE edema, foot dressed Dialysis Access: RU AVF +b/t   Filed Weights   06/28/22 1109 06/28/22 1219 06/29/22 0500  Weight: 52.7 kg 52.7 kg 52.5 kg    Intake/Output Summary (Last 24 hours) at 06/29/2022 0911 Last data filed at 06/29/2022 0700 Gross per 24 hour  Intake 813 ml  Output --  Net 813 ml    Additional Objective Labs: Basic Metabolic Panel: Recent Labs  Lab 06/28/22 0244 06/28/22 1330  NA 142  --   K 4.3  --   CL 94*  --   CO2 28  --   GLUCOSE 185*  --   BUN 31*  --   CREATININE 4.68*  --   CALCIUM 9.3  --   PHOS  --  4.6   Liver Function Tests: Recent Labs  Lab 06/28/22 0244  AST 20  ALT 10  ALKPHOS 101  BILITOT 0.3  PROT 7.6  ALBUMIN 2.7*   CBC: Recent Labs  Lab 06/28/22 0244  WBC 8.0  NEUTROABS 5.4  HGB 11.8*  HCT 38.9  MCV 85.1  PLT 185    CBG: Recent Labs  Lab 06/28/22 1108 06/28/22 1135 06/28/22 1621 06/28/22 2148 06/29/22 0757  GLUCAP 167* 183* 224* 291* 62*     Studies/Results: MRI Left foot without contrast  Result Date: 06/27/2022 CLINICAL DATA:  Left heel wound EXAM: MRI OF THE LEFT FOOT WITHOUT CONTRAST TECHNIQUE: Multiplanar, multisequence MR imaging of the left hindfoot was performed. No intravenous contrast was administered. COMPARISON:   X-ray 06/27/2022 FINDINGS: Bones/Joint/Cartilage Extensive findings of acute osteomyelitis involving the calcaneus with erosion of the posterior cortex and extensive bone marrow edema and confluent low T1 signal changes. Abnormal marrow signal extends into the subchondral bone along the anterior margin of the posterior subtalar joint and closely approximates the calcaneocuboid joint (series 8, image 12). There is presumed packing material at the posterior aspect of the calcaneus. Trace tibiotalar and posterior subtalar joint effusions, favored reactive. No additional sites of acute osteomyelitis are identified. Partially imaged prior second ray resection. No acute fracture or dislocation. Ligaments Intact medial and lateral ankle ligaments. Intact Lisfranc ligament. Muscles and Tendons Distal Achilles tendinosis. The Achilles tendon inserts onto abnormal, partially eroded posterior calcaneus. Findings of denervation or myositis involving the lower leg and foot musculature. No tenosynovitis. Soft tissues Large soft tissue wound at the heel. No organized or drainable fluid collections. IMPRESSION: 1. Large soft tissue wound at the heel with extensive findings of acute osteomyelitis involving the calcaneus. 2. Trace tibiotalar and posterior subtalar joint effusions, favored reactive. 3. Distal Achilles tendinosis. The Achilles tendon inserts onto abnormal, partially eroded posterior calcaneus. 4. Findings of denervation or myositis involving  the lower leg and foot musculature. Electronically Signed   By: Davina Poke D.O.   On: 06/27/2022 20:18   DG Foot Complete Left  Result Date: 06/27/2022 CLINICAL DATA:  Diabetes, cellulitis, left heel wound EXAM: LEFT FOOT - COMPLETE 3+ VIEW COMPARISON:  04/02/2022 FINDINGS: Frontal, oblique, and lateral views of the left foot are obtained. Bones are severely osteopenic. Prior second digit amputation at the level of the metatarsal. There is a large soft tissue defect  within the hindfoot, which exposes the underlying dorsal cortex of the calcaneus. Next lucency and sclerosis of the dorsal calcaneus consistent with osteomyelitis. 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 Diffuse atherosclerosis. IMPRESSION: 1. Large soft tissue defect overlying the calcaneus, with mottled lucency and sclerosis of the underlying dorsal margin of the calcaneus compatible with osteomyelitis. 2. Diffuse osteopenia. Electronically Signed   By: Randa Ngo M.D.   On: 06/27/2022 19:24    Medications:  sodium chloride     ceFEPime (MAXIPIME) IV Stopped (06/28/22 1451)    atorvastatin  40 mg Oral Daily   Chlorhexidine Gluconate Cloth  6 each Topical Q0600   cinacalcet  90 mg Oral Daily   dorzolamide-timolol  1 drop Left Eye BID   heparin  5,000 Units Subcutaneous Q8H   insulin aspart  0-5 Units Subcutaneous QHS   insulin aspart  0-6 Units Subcutaneous TID WC   insulin glargine-yfgn  5 Units Subcutaneous BID   leptospermum manuka honey  1 Application Topical Daily   midodrine  5 mg Oral TID WC   multivitamin with minerals  1 tablet Oral q morning   nystatin  5 mL Oral QID   sennosides  5 mL Per Tube BID   sodium chloride flush  3 mL Intravenous Q12H   thiamine  100 mg Per Tube Daily    Dialysis Orders: [facility] MWF   3h 31min  400/1.5   51kg  2/2 bath  Hep none RUA AVF - mircera 225 ug q2, last 11/10, due 11/24 - last HD 11/15 post wt 52.5kg     Na 142  K 4.3  CO2 28  BUN 31  Creat 4.6  alb 2.7  LFT's okay  Hb 11.8    WBC 8K   LA 3.5, 3.2, CRP 6.9     Assessment/ Plan: L foot acute osteomyelitis - w/ underlying PAD, started on IV abx, Plan for AKA Monday. ESRD - on HD MWF. HD today per regular schedule.  Due to Thanksgiving Holiday patients will run on an alternate schedule next week: Sunday, Tuesday, Friday.   BP- variable.  cont midodrine as at home for chronic hypotension Volume - Does  not appear volume overloaded.  Should meet dry weight post HD.  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 Cont sensipar.  DM2 - per pmd    Jen Mow, PA-C [facility] 06/29/2022,9:11 AM  LOS: 1 day

## 2022-06-29 NOTE — Progress Notes (Signed)
PROGRESS NOTE    [name]  VPX:[id number] DOB: Nov 04, 1980 DOA: 06/27/2022 PCP: Inc, [facility]    Chief Complaint  Patient presents with   Wound Check    Brief Narrative:   [name] is a 41 y.o. 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  She is also on chronic dialysis every Monday Wednesday Friday, she has peripheral vascular disease and anemia and chronic kidney disease as well as secondary hyperparathyroidism.  Patient has a known chronic left heel wound.  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  Patient was sent from the nursing facility because of increasing pain and drainage from the left heel wound.  She had been placed on doxycycline for the past 2 days without any improvement in symptoms.  Work-up significant for acute osteomyelitis, she was admitted for further work-up.      Assessment & Plan:   Principal Problem:   Osteomyelitis of ankle or foot, acute, left (HCC) Active Problems:   PVD (peripheral vascular disease) (HCC)   Secondary hyperparathyroidism of renal origin ([place])   ESRD on hemodialysis ([place])   Type 2 diabetes mellitus with hyperglycemia, with long-term current use of insulin (HCC)   Chronic left heel wound now with acute osteomyelitis - Patient with worsening of chronic left heel wound now with evidence of acute osteomyelitis and drainage -She is with known severe peripheral vascular disease, vascular surgery consulted, plan for AKA on Monday -Continue with broad-spectrum IV antibiotic coverage -Follow on blood cultures -Continue with pain meds as needed    ESRD - renal consulted  Diabetes mellitus on insulin -Was hyperglycemic on admission, but  this morning she is with hypoglycemia, will DC long-acting insulin, will keep only on insulin sliding scale.  Hypertension Controlled with volume management/hemodialysis   PVD history of prior right BKA Management per vascular surgery   History of CVA Continue secondary prevention with Lipitor     DVT prophylaxis: Heparin Code Status: (Full) Family Communication: none at bedside Disposition:   Status is: Inpatient    Consultants:  Renal  Vascular  Subjective:  Reports pain in her foot, no other complaints, no chest pain, no shortness of breath, no fever  Objective: Vitals:   06/29/22 0009 06/29/22 0500 06/29/22 0759 06/29/22 1322  BP: (!) 140/55  (!) 98/49 121/69  Pulse: 77  77 71  Resp: 19  18 18   Temp: 98.2 F (36.8 C)  97.6 F (36.4 C) 98.2 F (36.8 C)  TempSrc: Oral  Oral Oral  SpO2: 100%  100% 100%  Weight:  52.5 kg    Height:        Intake/Output Summary (Last 24 hours) at 06/29/2022 1343 Last data filed at 06/29/2022 1211 Gross per 24 hour  Intake 520 ml  Output --  Net 520 ml   Filed Weights   06/28/22 1109 06/28/22 1219 06/29/22 0500  Weight: 52.7 kg 52.7 kg 52.5 kg    Examination:  General exam: Appears calm and comfortable  Respiratory system: Clear to auscultation. Respiratory effort normal. Cardiovascular system: S1 & S2 heard, RRR. No JVD, murmurs, rubs, gallops or clicks. No pedal edema. Gastrointestinal system: Abdomen is nondistended, soft and nontender. No organomegaly or masses felt. Normal bowel sounds heard.  Central nervous system: Alert and oriented. No focal neurological deficits. Extremities: right AKA, left bandaged Psychiatry: Judgement and insight appear normal. Mood & affect appropriate.     Data Reviewed: I have personally reviewed following labs and imaging studies  CBC: Recent Labs  Lab 06/28/22 0244  WBC 8.0  NEUTROABS 5.4  HGB 11.8*  HCT 38.9  MCV 85.1  PLT 850    Basic Metabolic Panel: Recent Labs   Lab 06/28/22 0244 06/28/22 1330  NA 142  --   K 4.3  --   CL 94*  --   CO2 28  --   GLUCOSE 185*  --   BUN 31*  --   CREATININE 4.68*  --   CALCIUM 9.3  --   MG  --  2.2  PHOS  --  4.6    GFR: Estimated Creatinine Clearance: 12.1 mL/min (A) (by C-G formula based on SCr of 4.68 mg/dL (H)).  Liver Function Tests: Recent Labs  Lab 06/28/22 0244  AST 20  ALT 10  ALKPHOS 101  BILITOT 0.3  PROT 7.6  ALBUMIN 2.7*    CBG: Recent Labs  Lab 06/28/22 1621 06/28/22 2148 06/29/22 0757 06/29/22 0947 06/29/22 1127  GLUCAP 224* 291* 62* 79 73     Recent Results (from the past 240 hour(s))  Blood Cultures x 2 sites     Status: None (Preliminary result)   Collection Time: 06/27/22  2:44 AM   Specimen: BLOOD LEFT HAND  Result Value Ref Range Status   Specimen Description BLOOD LEFT HAND  Final   Special Requests   Final    BOTTLES DRAWN AEROBIC AND ANAEROBIC Blood Culture results may not be optimal due to an inadequate volume of blood received in culture bottles   Culture   Final    NO GROWTH 1 DAY Performed at [facility], [place] [address] [place], [place] [address]    Report Status PENDING  Incomplete  Blood Cultures x 2 sites     Status: None (Preliminary result)   Collection Time: 06/28/22  6:25 AM   Specimen: BLOOD RIGHT ARM  Result Value Ref Range Status   Specimen Description BLOOD RIGHT ARM  Final   Special Requests   Final    BOTTLES DRAWN AEROBIC AND ANAEROBIC Blood Culture adequate volume   Culture   Final    NO GROWTH 1 DAY Performed at [facility], [place] [address] [place], [place] [address]    Report Status PENDING  Incomplete         Radiology Studies: MRI Left foot without contrast  Result Date: 06/27/2022 CLINICAL DATA:  Left heel wound EXAM: MRI OF THE LEFT FOOT WITHOUT CONTRAST TECHNIQUE: Multiplanar, multisequence MR imaging of the left hindfoot was performed. No intravenous contrast was administered. COMPARISON:  X-ray  06/27/2022 FINDINGS: Bones/Joint/Cartilage Extensive findings of acute osteomyelitis involving the calcaneus with erosion of the posterior cortex and extensive bone marrow edema and confluent low T1 signal changes. Abnormal marrow signal extends into the subchondral bone along the anterior margin of the posterior subtalar joint and closely approximates the calcaneocuboid joint (series 8, image 12). There is presumed packing material at the posterior aspect of the calcaneus. Trace tibiotalar and posterior subtalar joint effusions, favored reactive. No additional sites of acute osteomyelitis are identified. Partially imaged prior second ray resection. No acute fracture or dislocation. Ligaments Intact medial and lateral ankle ligaments. Intact Lisfranc ligament. Muscles and Tendons Distal Achilles tendinosis. The Achilles tendon inserts onto  abnormal, partially eroded posterior calcaneus. Findings of denervation or myositis involving the lower leg and foot musculature. No tenosynovitis. Soft tissues Large soft tissue wound at the heel. No organized or drainable fluid collections. IMPRESSION: 1. Large soft tissue wound at the heel with extensive findings of acute osteomyelitis involving the calcaneus. 2. Trace tibiotalar and posterior subtalar joint effusions, favored reactive. 3. Distal Achilles tendinosis. The Achilles tendon inserts onto abnormal, partially eroded posterior calcaneus. 4. Findings of denervation or myositis involving the lower leg and foot musculature. Electronically Signed   By: Davina Poke D.O.   On: 06/27/2022 20:18   DG Foot Complete Left  Result Date: 06/27/2022 CLINICAL DATA:  Diabetes, cellulitis, left heel wound EXAM: LEFT FOOT - COMPLETE 3+ VIEW COMPARISON:  04/02/2022 FINDINGS: Frontal, oblique, and lateral views of the left foot are obtained. Bones are severely osteopenic. Prior second digit amputation at the level of the metatarsal. There is a large soft tissue defect within the  hindfoot, which exposes the underlying dorsal cortex of the calcaneus. Next lucency and sclerosis of the dorsal calcaneus consistent with osteomyelitis. 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 Diffuse atherosclerosis. IMPRESSION: 1. Large soft tissue defect overlying the calcaneus, with mottled lucency and sclerosis of the underlying dorsal margin of the calcaneus compatible with osteomyelitis. 2. Diffuse osteopenia. Electronically Signed   By: Randa Ngo M.D.   On: 06/27/2022 19:24        Scheduled Meds:  atorvastatin  40 mg Oral Daily   Chlorhexidine Gluconate Cloth  6 each Topical Q0600   cinacalcet  90 mg Oral Daily   dorzolamide-timolol  1 drop Left Eye BID   heparin  5,000 Units Subcutaneous Q8H   insulin aspart  0-5 Units Subcutaneous QHS   insulin aspart  0-6 Units Subcutaneous TID WC   insulin glargine-yfgn  5 Units Subcutaneous BID   leptospermum manuka honey  1 Application Topical Daily   midodrine  5 mg Oral TID WC   multivitamin with minerals  1 tablet Oral q morning   nystatin  5 mL Oral QID   sennosides  5 mL Per Tube BID   sodium chloride flush  3 mL Intravenous Q12H   thiamine  100 mg Per Tube Daily   Continuous Infusions:  sodium chloride     ceFEPime (MAXIPIME) IV Stopped (06/28/22 1451)   vancomycin       LOS: 1 day      Phillips Climes, MD [facility]   To contact the attending provider between 7A-7P or the covering provider during after hours 7P-7A, please log into the web site [email or web address] and access using universal [place] password for that web site. If you do not have the password, please call the hospital operator.  06/29/2022, 1:43 PM

## 2022-06-30 DIAGNOSIS — M86172 Other acute osteomyelitis, left ankle and foot: Secondary | ICD-10-CM | POA: Diagnosis not present

## 2022-06-30 DIAGNOSIS — I739 Peripheral vascular disease, unspecified: Secondary | ICD-10-CM | POA: Diagnosis not present

## 2022-06-30 DIAGNOSIS — N186 End stage renal disease: Secondary | ICD-10-CM | POA: Diagnosis not present

## 2022-06-30 DIAGNOSIS — Z992 Dependence on renal dialysis: Secondary | ICD-10-CM | POA: Diagnosis not present

## 2022-06-30 LAB — GLUCOSE, CAPILLARY
Glucose-Capillary: 169 mg/dL — ABNORMAL HIGH (ref 70–99)
Glucose-Capillary: 100 mg/dL — ABNORMAL HIGH (ref 70–99)
Glucose-Capillary: 148 mg/dL — ABNORMAL HIGH (ref 70–99)
Glucose-Capillary: 188 mg/dL — ABNORMAL HIGH (ref 70–99)

## 2022-06-30 MED ORDER — NEPRO/CARBSTEADY PO LIQD
237.0000 mL | Freq: Three times a day (TID) | ORAL | Status: DC
  Administered 2022-06-30 – 2022-07-04 (×10): 237 mL via ORAL

## 2022-06-30 MED ORDER — CHLORHEXIDINE GLUCONATE CLOTH 2 % EX PADS
6.0000 | MEDICATED_PAD | Freq: Every day | CUTANEOUS | Status: DC
  Administered 2022-07-01 – 2022-07-04 (×4): 6 via TOPICAL

## 2022-06-30 NOTE — Progress Notes (Addendum)
PROGRESS NOTE    [name]  CWU:[id number] DOB: 13-Jun-1981 DOA: 06/27/2022 PCP: [facility]    Chief Complaint  Patient presents with   Wound Check    Brief Narrative:   [name] is a 41 y.o. 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  She is also on chronic dialysis every Monday Wednesday Friday, she has peripheral vascular disease and anemia and chronic kidney disease as well as secondary hyperparathyroidism.  Patient has a known chronic left heel wound.  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  Patient was sent from the nursing facility because of increasing pain and drainage from the left heel wound.  She had been placed on doxycycline for the past 2 days without any improvement in symptoms.  Work-up significant for acute osteomyelitis, she was admitted for further work-up.      Assessment & Plan:   Principal Problem:   Osteomyelitis of ankle or foot, acute, left (HCC) Active Problems:   PVD (peripheral vascular disease) (HCC)   Secondary hyperparathyroidism of renal origin ([place])   ESRD on hemodialysis ([place])   Type 2 diabetes mellitus with hyperglycemia, with long-term current use of insulin (HCC)   Chronic left heel wound now with acute osteomyelitis - Patient with worsening of chronic left heel wound now with evidence of acute osteomyelitis and drainage -She is with known severe peripheral vascular disease, vascular surgery consulted, plan for AKA on Monday -Continue with broad-spectrum IV antibiotic coverage -Follow on blood cultures -Continue with pain meds as needed    ESRD - renal consulted  Diabetes mellitus on insulin -Was hyperglycemic on admission, but  she was hypoglycemic thereafter, with poor appetite, so long-acting insulin has been discontinued, will keep on insulin sliding scale meanwhile. -Appetite is poor , will start on Nepro.   Hypertension Controlled with volume management/hemodialysis   PVD history of prior right AKA Management per vascular surgery   History of CVA Continue secondary prevention with Lipitor     DVT prophylaxis: Heparin Code Status: (Full) Family Communication: none at bedside Disposition:   Status is: Inpatient    Consultants:  Renal  Vascular  Subjective:  She denies any complaints today, but she is with poor appetite as discussed with staff, will be started on Nepro.  Objective: Vitals:   06/29/22 2012 06/29/22 2020 06/30/22 0546 06/30/22 0739  BP: (!) 170/66 (!) 148/64 110/70 (!) 109/48  Pulse: 77 74 80 67  Resp: 19  17 18   Temp: 98.5 F (36.9 C)  97.9 F (36.6 C) 97.9 F (36.6 C)  TempSrc: Oral  Oral Oral  SpO2:   100% 100%  Weight:      Height:        Intake/Output Summary (Last 24 hours) at 06/30/2022 0959 Last data filed at 06/29/2022 1837 Gross per 24 hour  Intake 220 ml  Output 200 ml  Net 20 ml   Filed Weights   06/28/22 1219 06/29/22 0500 06/29/22 1756  Weight: 52.7 kg 52.5 kg 52.3 kg    Examination:   Awake Alert, Oriented X 3, No new F.N deficits, Normal affect Symmetrical Chest wall movement, Good air movement bilaterally, CTAB RRR,No Gallops,Rubs or new Murmurs, No Parasternal Heave +ve B.Sounds, Abd Soft,  No tenderness, No rebound - guarding or rigidity. Right AKA, left foot bandaged    Data Reviewed: I have personally reviewed following labs and imaging studies  CBC: Recent Labs  Lab 06/28/22 0244 06/29/22 1416  WBC 8.0 8.1  NEUTROABS 5.4  --   HGB 11.8* 11.1*  HCT 38.9 35.1*  MCV 85.1 81.4  PLT 185 767    Basic Metabolic Panel: Recent Labs  Lab 06/28/22 0244 06/28/22 1330 06/29/22 1416  NA 142  --  137  K 4.3  --  4.9  CL 94*   --  92*  CO2 28  --  31  GLUCOSE 185*  --  83  BUN 31*  --  53*  CREATININE 4.68*  --  6.80*  CALCIUM 9.3  --  9.3  MG  --  2.2  --   PHOS  --  4.6  --     GFR: Estimated Creatinine Clearance: 8.3 mL/min (A) (by C-G formula based on SCr of 6.8 mg/dL (H)).  Liver Function Tests: Recent Labs  Lab 06/28/22 0244 06/29/22 1416  AST 20 15  ALT 10 9  ALKPHOS 101 92  BILITOT 0.3 0.4  PROT 7.6 6.8  ALBUMIN 2.7* 2.4*    CBG: Recent Labs  Lab 06/29/22 0947 06/29/22 1127 06/29/22 1826 06/29/22 2127 06/30/22 0738  GLUCAP 79 73 87 169* 169*     Recent Results (from the past 240 hour(s))  Blood Cultures x 2 sites     Status: None (Preliminary result)   Collection Time: 06/27/22  2:44 AM   Specimen: BLOOD LEFT HAND  Result Value Ref Range Status   Specimen Description BLOOD LEFT HAND  Final   Special Requests   Final    BOTTLES DRAWN AEROBIC AND ANAEROBIC Blood Culture results may not be optimal due to an inadequate volume of blood received in culture bottles   Culture   Final    NO GROWTH 2 DAYS Performed at [facility], [place] [address] [place], [place] [address]    Report Status PENDING  Incomplete  Blood Cultures x 2 sites     Status: None (Preliminary result)   Collection Time: 06/28/22  6:25 AM   Specimen: BLOOD RIGHT ARM  Result Value Ref Range Status   Specimen Description BLOOD RIGHT ARM  Final   Special Requests   Final    BOTTLES DRAWN AEROBIC AND ANAEROBIC Blood Culture adequate volume   Culture   Final    NO GROWTH 2 DAYS Performed at [facility], [place] [address], [place], [place] [address]    Report Status PENDING  Incomplete         Radiology Studies: No results found.      Scheduled Meds:  atorvastatin  40 mg Oral Daily   Chlorhexidine Gluconate Cloth  6 each Topical Q0600   cinacalcet  90 mg Oral Daily   dorzolamide-timolol  1 drop Left Eye BID   feeding supplement (NEPRO CARB STEADY)  237 mL Oral TID BM   heparin  5,000  Units Subcutaneous Q8H   insulin aspart  0-5 Units Subcutaneous QHS   insulin aspart  0-6 Units Subcutaneous TID WC   leptospermum manuka honey  1 Application Topical Daily   midodrine  5 mg Oral TID WC   multivitamin with minerals  1 tablet Oral q morning   nystatin  5 mL Oral QID   sennosides  5 mL Per Tube BID   sodium chloride flush  3 mL Intravenous Q12H   thiamine  100 mg Per Tube Daily   Continuous Infusions:  sodium chloride     ceFEPime (MAXIPIME) IV 1 g (06/29/22 2059)     LOS: 2 days      Phillips Climes, MD [facility]   To contact the attending provider between 7A-7P or the covering provider during after hours 7P-7A, please log into the web site [email or web address] and access using universal [place] password for that web site. If you do not have the password, please call the hospital operator.  06/30/2022, 9:59 AM

## 2022-06-30 NOTE — Plan of Care (Signed)
Problem: Education: Goal: Ability to describe self-care measures that may prevent or decrease complications (Diabetes Survival Skills Education) will improve 06/30/2022 0417 by Delton See, RN Outcome: Progressing 06/30/2022 0414 by Delton See, RN Outcome: Progressing Goal: Individualized Educational Video(s) 06/30/2022 0417 by Delton See, RN Outcome: Progressing 06/30/2022 0414 by Delton See, RN Outcome: Progressing   Problem: Coping: Goal: Ability to adjust to condition or change in health will improve 06/30/2022 0417 by Delton See, RN Outcome: Progressing 06/30/2022 0414 by Delton See, RN Outcome: Progressing   Problem: Fluid Volume: Goal: Ability to maintain a balanced intake and output will improve 06/30/2022 0417 by Delton See, RN Outcome: Progressing 06/30/2022 0414 by Delton See, RN Outcome: Progressing   Problem: Health Behavior/Discharge Planning: Goal: Ability to identify and utilize available resources and services will improve 06/30/2022 0417 by Delton See, RN Outcome: Progressing 06/30/2022 0414 by Delton See, RN Outcome: Progressing Goal: Ability to manage health-related needs will improve 06/30/2022 0417 by Delton See, RN Outcome: Not Progressing 06/30/2022 0414 by Delton See, RN Outcome: Progressing   Problem: Metabolic: Goal: Ability to maintain appropriate glucose levels will improve 06/30/2022 0417 by Delton See, RN Outcome: Progressing 06/30/2022 0414 by Delton See, RN Outcome: Progressing   Problem: Nutritional: Goal: Maintenance of adequate nutrition will improve 06/30/2022 0417 by Delton See, RN Outcome: Progressing 06/30/2022 0414 by Delton See, RN Outcome: Progressing Goal: Progress toward achieving an optimal weight will improve 06/30/2022 0417 by Delton See, RN Outcome: Progressing 06/30/2022 0414 by Delton See, RN Outcome: Progressing   Problem: Skin Integrity: Goal: Risk for impaired skin integrity will  decrease 06/30/2022 0417 by Delton See, RN Outcome: Progressing 06/30/2022 0414 by Delton See, RN Outcome: Progressing   Problem: Tissue Perfusion: Goal: Adequacy of tissue perfusion will improve 06/30/2022 0417 by Delton See, RN Outcome: Progressing 06/30/2022 0414 by Delton See, RN Outcome: Progressing   Problem: Education: Goal: Knowledge of General Education information will improve Description: Including pain rating scale, medication(s)/side effects and non-pharmacologic comfort measures 06/30/2022 0417 by Delton See, RN Outcome: Progressing 06/30/2022 0414 by Delton See, RN Outcome: Progressing   Problem: Health Behavior/Discharge Planning: Goal: Ability to manage health-related needs will improve 06/30/2022 0417 by Delton See, RN Outcome: Progressing 06/30/2022 0414 by Delton See, RN Outcome: Progressing   Problem: Clinical Measurements: Goal: Ability to maintain clinical measurements within normal limits will improve 06/30/2022 0417 by Delton See, RN Outcome: Progressing 06/30/2022 0414 by Delton See, RN Outcome: Progressing Goal: Will remain free from infection 06/30/2022 0417 by Delton See, RN Outcome: Progressing 06/30/2022 0414 by Delton See, RN Outcome: Progressing Goal: Diagnostic test results will improve 06/30/2022 0417 by Delton See, RN Outcome: Progressing 06/30/2022 0414 by Delton See, RN Outcome: Progressing Goal: Respiratory complications will improve 06/30/2022 0417 by Delton See, RN Outcome: Progressing 06/30/2022 0414 by Delton See, RN Outcome: Progressing Goal: Cardiovascular complication will be avoided 06/30/2022 0417 by Delton See, RN Outcome: Progressing 06/30/2022 0414 by Delton See, RN Outcome: Progressing   Problem: Nutrition: Goal: Adequate nutrition will be maintained 06/30/2022 0417 by Delton See, RN Outcome: Progressing 06/30/2022 0414 by Delton See, RN Outcome: Progressing   Problem:  Elimination: Goal: Will not experience complications related to bowel motility 06/30/2022 0417 by Delton See, RN Outcome: Progressing 06/30/2022 0414 by Delton See, RN Outcome: Progressing Goal: Will not experience complications related to urinary retention 06/30/2022 0417 by Delton See, RN Outcome: Progressing 06/30/2022 0414 by Delton See, RN Outcome: Progressing   Problem: Safety: Goal: Ability to remain free from injury will improve  06/30/2022 0417 by Delton See, RN Outcome: Progressing 06/30/2022 0414 by Delton See, RN Outcome: Progressing   Problem: Skin Integrity: Goal: Risk for impaired skin integrity will decrease 06/30/2022 0417 by Delton See, RN Outcome: Progressing 06/30/2022 0414 by Delton See, RN Outcome: Progressing

## 2022-06-30 NOTE — Progress Notes (Signed)
[facility] Progress Note   Subjective:   Patient seen and examined in room.  Denies pain. Finished breakfast.  No issues with dialysis yesterday.  Denies CP, SOB, abdominal pain and n/v/d.    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, L foot dressed Dialysis Access: RU AVF +b/t   Filed Weights   06/28/22 1219 06/29/22 0500 06/29/22 1756  Weight: 52.7 kg 52.5 kg 52.3 kg    Intake/Output Summary (Last 24 hours) at 06/30/2022 1247 Last data filed at 06/29/2022 1837 Gross per 24 hour  Intake 100 ml  Output 200 ml  Net -100 ml    Additional Objective Labs: Basic Metabolic Panel: Recent Labs  Lab 06/28/22 0244 06/28/22 1330 06/29/22 1416  NA 142  --  137  K 4.3  --  4.9  CL 94*  --  92*  CO2 28  --  31  GLUCOSE 185*  --  83  BUN 31*  --  53*  CREATININE 4.68*  --  6.80*  CALCIUM 9.3  --  9.3  PHOS  --  4.6  --    Liver Function Tests: Recent Labs  Lab 06/28/22 0244 06/29/22 1416  AST 20 15  ALT 10 9  ALKPHOS 101 92  BILITOT 0.3 0.4  PROT 7.6 6.8  ALBUMIN 2.7* 2.4*    CBC: Recent Labs  Lab 06/28/22 0244 06/29/22 1416  WBC 8.0 8.1  NEUTROABS 5.4  --   HGB 11.8* 11.1*  HCT 38.9 35.1*  MCV 85.1 81.4  PLT 185 195   Blood Culture    Component Value Date/Time   SDES BLOOD RIGHT ARM 06/28/2022 0625   SPECREQUEST  06/28/2022 0625    BOTTLES DRAWN AEROBIC AND ANAEROBIC Blood Culture adequate volume   CULT  06/28/2022 0625    NO GROWTH 2 DAYS Performed at [facility], [place] [address], [place], [place] [address]     REPTSTATUS PENDING 06/28/2022 0625    CBG: Recent Labs  Lab 06/29/22 1127 06/29/22 1826 06/29/22 2127 06/30/22 0738 06/30/22 1129  GLUCAP 73 87 169* 169* 100*   Medications:  sodium chloride     ceFEPime (MAXIPIME) IV 1 g (06/29/22 2059)    atorvastatin  40 mg Oral Daily   Chlorhexidine Gluconate Cloth  6 each Topical Q0600   cinacalcet  90 mg Oral Daily   dorzolamide-timolol  1 drop Left Eye BID   feeding supplement (NEPRO CARB STEADY)  237 mL Oral TID BM   heparin  5,000 Units Subcutaneous Q8H   insulin aspart  0-5 Units Subcutaneous QHS   insulin aspart  0-6 Units Subcutaneous TID WC   leptospermum manuka honey  1 Application Topical Daily   midodrine  5 mg Oral TID WC   multivitamin with minerals  1 tablet Oral q morning   nystatin  5 mL Oral QID   sennosides  5 mL Per Tube BID   sodium chloride flush  3 mL Intravenous Q12H  thiamine  100 mg Per Tube Daily    Dialysis Orders: [facility] MWF   3h 39min  400/1.5   51kg  2/2 bath  Hep none RUA AVF - mircera 225 ug q2, last 11/10, due 11/24 - last HD 11/15 post wt 52.5kg     Na 142  K 4.3  CO2 28  BUN 31  Creat 4.6  alb 2.7  LFT's okay  Hb 11.8    WBC 8K   LA 3.5, 3.2, CRP 6.9    Assessment/ Plan: L foot acute osteomyelitis - w/ underlying PAD, started on IV abx, Plan for AKA Monday. ESRD - on HD MWF.  Next HD tomorrow d/t holiday schedule.  Week of Thanksgiving Holiday MWF patients will run on an alternate schedule: Sunday, Tuesday, Friday.   BP- variable.  cont midodrine for chronic hypotension Volume - Does not appear volume overloaded.  UF as tolerated.  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 Cont sensipar.  DM2 - per pmd 8. Nutrition - renal diet w/fluid restrictions  Jen Mow, PA-C [facility] 06/30/2022,12:47 PM  LOS: 2 days

## 2022-06-30 NOTE — Plan of Care (Signed)
  Problem: Education: Goal: Ability to describe self-care measures that may prevent or decrease complications (Diabetes Survival Skills Education) will improve Outcome: Progressing Goal: Individualized Educational Video(s) Outcome: Progressing   Problem: Coping: Goal: Ability to adjust to condition or change in health will improve Outcome: Progressing   Problem: Fluid Volume: Goal: Ability to maintain a balanced intake and output will improve Outcome: Progressing   Problem: Health Behavior/Discharge Planning: Goal: Ability to identify and utilize available resources and services will improve Outcome: Progressing Goal: Ability to manage health-related needs will improve Outcome: Progressing   Problem: Metabolic: Goal: Ability to maintain appropriate glucose levels will improve Outcome: Progressing   Problem: Nutritional: Goal: Maintenance of adequate nutrition will improve Outcome: Progressing Goal: Progress toward achieving an optimal weight will improve Outcome: Progressing   Problem: Skin Integrity: Goal: Risk for impaired skin integrity will decrease Outcome: Progressing   Problem: Education: Goal: Knowledge of General Education information will improve Description: Including pain rating scale, medication(s)/side effects and non-pharmacologic comfort measures Outcome: Progressing   Problem: Health Behavior/Discharge Planning: Goal: Ability to manage health-related needs will improve Outcome: Progressing   Problem: Clinical Measurements: Goal: Ability to maintain clinical measurements within normal limits will improve Outcome: Progressing Goal: Will remain free from infection Outcome: Progressing Goal: Diagnostic test results will improve Outcome: Progressing Goal: Respiratory complications will improve Outcome: Progressing Goal: Cardiovascular complication will be avoided Outcome: Progressing   Problem: Activity: Goal: Risk for activity intolerance will  decrease Outcome: Progressing   Problem: Nutrition: Goal: Adequate nutrition will be maintained Outcome: Progressing   Problem: Skin Integrity: Goal: Risk for impaired skin integrity will decrease Outcome: Progressing   Problem: Safety: Goal: Ability to remain free from injury will improve Outcome: Progressing

## 2022-06-30 NOTE — Progress Notes (Addendum)
   VASCULAR SURGERY ASSESSMENT & PLAN:   OSTEO LEFT HEEL: Patient is scheduled for a left above-the-knee amputation on Monday.  RENAL: She is scheduled for surgery on Monday morning.  Please do not dialyze the patient on Monday morning.  Thank you.  SUBJECTIVE:   No complaints this morning.  PHYSICAL EXAM:   Vitals:   06/29/22 1822 06/29/22 2012 06/29/22 2020 06/30/22 0546  BP: (!) 111/27 (!) 170/66 (!) 148/64 110/70  Pulse: 79 77 74 80  Resp: 18 19  17   Temp: 98.4 F (36.9 C) 98.5 F (36.9 C)  97.9 F (36.6 C)  TempSrc: Oral Oral  Oral  SpO2: 100%   100%  Weight:      Height:       No change in left foot.  LABS:   Lab Results  Component Value Date   WBC 8.1 06/29/2022   HGB 11.1 (L) 06/29/2022   HCT 35.1 (L) 06/29/2022   MCV 81.4 06/29/2022   PLT 195 06/29/2022   Lab Results  Component Value Date   CREATININE 6.80 (H) 06/29/2022   Lab Results  Component Value Date   INR 1.3 (H) 06/28/2022   CBG (last 3)  Recent Labs    06/29/22 1127 06/29/22 1826 06/29/22 2127  GLUCAP 73 87 169*    PROBLEM LIST:    Principal Problem:   Osteomyelitis of ankle or foot, acute, left (HCC) Active Problems:   PVD (peripheral vascular disease) ([place])   Secondary hyperparathyroidism of renal origin ([place])   ESRD on hemodialysis ([place])   Type 2 diabetes mellitus with hyperglycemia, with long-term current use of insulin (HCC)   CURRENT MEDS:    atorvastatin  40 mg Oral Daily   Chlorhexidine Gluconate Cloth  6 each Topical Q0600   cinacalcet  90 mg Oral Daily   dorzolamide-timolol  1 drop Left Eye BID   heparin  5,000 Units Subcutaneous Q8H   insulin aspart  0-5 Units Subcutaneous QHS   insulin aspart  0-6 Units Subcutaneous TID WC   leptospermum manuka honey  1 Application Topical Daily   midodrine  5 mg Oral TID WC   multivitamin with minerals  1 tablet Oral q morning   nystatin  5 mL Oral QID   sennosides  5 mL Per Tube BID   sodium chloride flush  3 mL  Intravenous Q12H   thiamine  100 mg Per Tube Daily    Deitra Mayo Office: [phone] 06/30/2022

## 2022-07-01 DIAGNOSIS — N186 End stage renal disease: Secondary | ICD-10-CM | POA: Diagnosis not present

## 2022-07-01 DIAGNOSIS — Z992 Dependence on renal dialysis: Secondary | ICD-10-CM | POA: Diagnosis not present

## 2022-07-01 DIAGNOSIS — I739 Peripheral vascular disease, unspecified: Secondary | ICD-10-CM | POA: Diagnosis not present

## 2022-07-01 DIAGNOSIS — M86372 Chronic multifocal osteomyelitis, left ankle and foot: Secondary | ICD-10-CM

## 2022-07-01 DIAGNOSIS — M86172 Other acute osteomyelitis, left ankle and foot: Secondary | ICD-10-CM | POA: Diagnosis not present

## 2022-07-01 LAB — RENAL FUNCTION PANEL
Albumin: 2.4 g/dL — ABNORMAL LOW (ref 3.5–5.0)
Anion gap: 18 — ABNORMAL HIGH (ref 5–15)
BUN: 51 mg/dL — ABNORMAL HIGH (ref 6–20)
CO2: 25 mmol/L (ref 22–32)
Calcium: 8.7 mg/dL — ABNORMAL LOW (ref 8.9–10.3)
Chloride: 91 mmol/L — ABNORMAL LOW (ref 98–111)
Creatinine, Ser: 6.19 mg/dL — ABNORMAL HIGH (ref 0.44–1.00)
GFR, Estimated: 8 mL/min — ABNORMAL LOW (ref >60)
Glucose, Bld: 269 mg/dL — ABNORMAL HIGH (ref 70–99)
Phosphorus: 4.4 mg/dL (ref 2.5–4.6)
Potassium: 5.1 mmol/L (ref 3.5–5.1)
Sodium: 134 mmol/L — ABNORMAL LOW (ref 135–145)

## 2022-07-01 LAB — GLUCOSE, CAPILLARY
Glucose-Capillary: 274 mg/dL — ABNORMAL HIGH (ref 70–99)
Glucose-Capillary: 258 mg/dL — ABNORMAL HIGH (ref 70–99)
Glucose-Capillary: 213 mg/dL — ABNORMAL HIGH (ref 70–99)
Glucose-Capillary: 181 mg/dL — ABNORMAL HIGH (ref 70–99)
Glucose-Capillary: 97 mg/dL (ref 70–99)

## 2022-07-01 LAB — CBC
HCT: 38.8 % (ref 36.0–46.0)
Hemoglobin: 12.1 g/dL (ref 12.0–15.0)
MCH: 26 pg (ref 26.0–34.0)
MCHC: 31.2 g/dL (ref 30.0–36.0)
MCV: 83.4 fL (ref 80.0–100.0)
Platelets: 175 10*3/uL (ref 150–400)
RBC: 4.65 MIL/uL (ref 3.87–5.11)
RDW: 20.2 % — ABNORMAL HIGH (ref 11.5–15.5)
WBC: 7.9 10*3/uL (ref 4.0–10.5)
nRBC: 0 % (ref 0.0–0.2)

## 2022-07-01 MED ORDER — VANCOMYCIN HCL 500 MG/100ML IV SOLN
500.0000 mg | Freq: Once | INTRAVENOUS | Status: AC
  Administered 2022-07-01: 500 mg via INTRAVENOUS
  Filled 2022-07-01: qty 100

## 2022-07-01 MED ORDER — VANCOMYCIN HCL IN DEXTROSE 1-5 GM/200ML-% IV SOLN: 1000.0000 mg | INTRAVENOUS | Status: DC

## 2022-07-01 MED ORDER — ALBUMIN HUMAN 25 % IV SOLN
25.0000 g | Freq: Once | INTRAVENOUS | Status: AC
  Administered 2022-07-01: 25 g via INTRAVENOUS
  Filled 2022-07-01: qty 100

## 2022-07-01 MED ORDER — VANCOMYCIN HCL 500 MG/100ML IV SOLN: 500.0000 mg | Freq: Once | INTRAVENOUS | Status: DC

## 2022-07-01 NOTE — H&P (View-Only) (Signed)
   VASCULAR SURGERY ASSESSMENT & PLAN:   OSTEOMYELITIS LEFT HEEL: The patient is scheduled for a left above-the-knee amputation tomorrow.  I have written preop orders.  I discussed this with her yesterday and she is agreeable to proceed.  All of her questions were answered.  RENAL: She is scheduled for dialysis today and then again on Tuesday.  Thus this should not interfere with her OR schedule tomorrow morning.  SUBJECTIVE:   Resting comfortably.  PHYSICAL EXAM:   Vitals:   06/30/22 1553 06/30/22 2010 07/01/22 0425 07/01/22 0500  BP: (!) 123/46 (!) 140/49 (!) 160/55   Pulse: 69 72 70   Resp: 18 16 15    Temp: 97.9 F (36.6 C) 98.9 F (37.2 C) 98.8 F (37.1 C)   TempSrc: Oral Oral    SpO2: 100% 100% 100%   Weight:    54.8 kg  Height:       No change in left heel.  LABS:   Lab Results  Component Value Date   WBC 7.9 07/01/2022   HGB 12.1 07/01/2022   HCT 38.8 07/01/2022   MCV 83.4 07/01/2022   PLT 175 07/01/2022   Lab Results  Component Value Date   CREATININE 6.19 (H) 07/01/2022   Lab Results  Component Value Date   INR 1.3 (H) 06/28/2022   CBG (last 3)  Recent Labs    06/30/22 1129 06/30/22 1552 06/30/22 2041  GLUCAP 100* 148* 188*    PROBLEM LIST:    Principal Problem:   Osteomyelitis of ankle or foot, acute, left (HCC) Active Problems:   PVD (peripheral vascular disease) (HCC)   Secondary hyperparathyroidism of renal origin ([place])   ESRD on hemodialysis ([place])   Type 2 diabetes mellitus with hyperglycemia, with long-term current use of insulin (HCC)   CURRENT MEDS:    atorvastatin  40 mg Oral Daily   Chlorhexidine Gluconate Cloth  6 each Topical Q0600   cinacalcet  90 mg Oral Daily   dorzolamide-timolol  1 drop Left Eye BID   feeding supplement (NEPRO CARB STEADY)  237 mL Oral TID BM   heparin  5,000 Units Subcutaneous Q8H   insulin aspart  0-5 Units Subcutaneous QHS   insulin aspart  0-6 Units Subcutaneous TID WC   leptospermum manuka  honey  1 Application Topical Daily   midodrine  5 mg Oral TID WC   multivitamin with minerals  1 tablet Oral q morning   nystatin  5 mL Oral QID   sennosides  5 mL Per Tube BID   sodium chloride flush  3 mL Intravenous Q12H   thiamine  100 mg Per Tube Daily    Deitra Mayo Office: [phone] 07/01/2022

## 2022-07-01 NOTE — Progress Notes (Signed)
   VASCULAR SURGERY ASSESSMENT & PLAN:   OSTEOMYELITIS LEFT HEEL: The patient is scheduled for a left above-the-knee amputation tomorrow.  I have written preop orders.  I discussed this with her yesterday and she is agreeable to proceed.  All of her questions were answered.  RENAL: She is scheduled for dialysis today and then again on Tuesday.  Thus this should not interfere with her OR schedule tomorrow morning.  SUBJECTIVE:   Resting comfortably.  PHYSICAL EXAM:   Vitals:   06/30/22 1553 06/30/22 2010 07/01/22 0425 07/01/22 0500  BP: (!) 123/46 (!) 140/49 (!) 160/55   Pulse: 69 72 70   Resp: 18 16 15    Temp: 97.9 F (36.6 C) 98.9 F (37.2 C) 98.8 F (37.1 C)   TempSrc: Oral Oral    SpO2: 100% 100% 100%   Weight:    54.8 kg  Height:       No change in left heel.  LABS:   Lab Results  Component Value Date   WBC 7.9 07/01/2022   HGB 12.1 07/01/2022   HCT 38.8 07/01/2022   MCV 83.4 07/01/2022   PLT 175 07/01/2022   Lab Results  Component Value Date   CREATININE 6.19 (H) 07/01/2022   Lab Results  Component Value Date   INR 1.3 (H) 06/28/2022   CBG (last 3)  Recent Labs    06/30/22 1129 06/30/22 1552 06/30/22 2041  GLUCAP 100* 148* 188*    PROBLEM LIST:    Principal Problem:   Osteomyelitis of ankle or foot, acute, left (HCC) Active Problems:   PVD (peripheral vascular disease) (HCC)   Secondary hyperparathyroidism of renal origin ([place])   ESRD on hemodialysis ([place])   Type 2 diabetes mellitus with hyperglycemia, with long-term current use of insulin (HCC)   CURRENT MEDS:    atorvastatin  40 mg Oral Daily   Chlorhexidine Gluconate Cloth  6 each Topical Q0600   cinacalcet  90 mg Oral Daily   dorzolamide-timolol  1 drop Left Eye BID   feeding supplement (NEPRO CARB STEADY)  237 mL Oral TID BM   heparin  5,000 Units Subcutaneous Q8H   insulin aspart  0-5 Units Subcutaneous QHS   insulin aspart  0-6 Units Subcutaneous TID WC   leptospermum manuka  honey  1 Application Topical Daily   midodrine  5 mg Oral TID WC   multivitamin with minerals  1 tablet Oral q morning   nystatin  5 mL Oral QID   sennosides  5 mL Per Tube BID   sodium chloride flush  3 mL Intravenous Q12H   thiamine  100 mg Per Tube Daily    Deitra Mayo Office: [phone] 07/01/2022

## 2022-07-01 NOTE — Progress Notes (Signed)
   07/01/22 2030  Vitals  Pulse Rate 76  Resp (!) 9  BP (!) 161/53  SpO2 100 %  O2 Device Room Air  Oxygen Therapy  Patient Activity (if Appropriate) In bed  Post Treatment  Dialyzer Clearance Clear  Duration of HD Treatment -hour(s) 2.44 hour(s)  Hemodialysis Intake (mL) 0 mL  Liters Processed 57.4  Fluid Removed (mL) 600 mL  Tolerated HD Treatment Yes  Post-Hemodialysis Comments low b/p throughout treatment unable to reach goal, ended treatment early due to machine, pt did not want to be put back on machine  AVG/AVF Arterial Site Held (minutes) 10 minutes  AVG/AVF Venous Site Held (minutes) 10 minutes

## 2022-07-01 NOTE — Progress Notes (Signed)
PROGRESS NOTE    [name]  WUJ:[id number] DOB: August 20, 1980 DOA: 06/27/2022 PCP: Inc, [facility]    Chief Complaint  Patient presents with   Wound Check    Brief Narrative:   [name] is a 41 y.o. 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  She is also on chronic dialysis every Monday Wednesday Friday, she has peripheral vascular disease and anemia and chronic kidney disease as well as secondary hyperparathyroidism.  Patient has a known chronic left heel wound.  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  Patient was sent from the nursing facility because of increasing pain and drainage from the left heel wound.  She had been placed on doxycycline for the past 2 days without any improvement in symptoms.  Work-up significant for acute osteomyelitis, she was admitted for further work-up.      Assessment & Plan:   Principal Problem:   Osteomyelitis of ankle or foot, acute, left (HCC) Active Problems:   PVD (peripheral vascular disease) (HCC)   Secondary hyperparathyroidism of renal origin ([place])   ESRD on hemodialysis ([place])   Type 2 diabetes mellitus with hyperglycemia, with long-term current use of insulin (HCC)   Chronic left heel wound now with acute osteomyelitis - Patient with worsening of chronic left heel wound now with evidence of acute osteomyelitis and drainage -She is with known severe peripheral vascular disease, vascular surgery consulted, plan for AKA on Monday -Continue with broad-spectrum IV antibiotic coverage -Follow on blood cultures, remains negative to date. -Continue with pain meds as needed    ESRD - renal consulted, she will  be dialyzed today, and next HD  on Tuesday(holidaY schedule)  Diabetes mellitus on insulin -Was hyperglycemic on admission, but she was hypoglycemic thereafter, with poor appetite, so long-acting insulin has been discontinued, will keep on insulin sliding scale meanwhile. -Appetite is poor , will start on Nepro.   Hypertension Controlled with volume management/hemodialysis   PVD history of prior right AKA Management per vascular surgery   History of CVA Continue secondary prevention with Lipitor     DVT prophylaxis: Heparin Code Status: (Full) Family Communication: none at bedside Disposition:   Status is: Inpatient    Consultants:  Renal  Vascular  Subjective:  No significant events overnight, she denies any complaints today .  Objective: Vitals:   06/30/22 2010 07/01/22 0425 07/01/22 0500 07/01/22 0823  BP: (!) 140/49 (!) 160/55  (!) 133/56  Pulse: 72 70  71  Resp: 16 15  17   Temp: 98.9 F (37.2 C) 98.8 F (37.1 C)  98.5 F (36.9 C)  TempSrc: Oral   Oral  SpO2: 100% 100%  98%  Weight:   54.8 kg   Height:       No intake or output data in the 24 hours ending 07/01/22 0938  Filed Weights   06/29/22 0500 06/29/22 1756 07/01/22 0500  Weight: 52.5 kg 52.3 kg 54.8 kg    Examination:   Awake Alert, Oriented X 3,  She is blind in the right eye Symmetrical Chest wall movement, Good air movement bilaterally, CTAB RRR,No Gallops,Rubs or new Murmurs, No Parasternal Heave +ve B.Sounds, Abd Soft, No tenderness, No rebound - guarding or rigidity. Right AKA, left  foot bandaged    Data Reviewed: I have personally reviewed following labs and imaging studies  CBC: Recent Labs  Lab 06/28/22 0244 06/29/22 1416 07/01/22 0554  WBC 8.0 8.1 7.9  NEUTROABS 5.4  --   --   HGB 11.8* 11.1* 12.1  HCT 38.9 35.1* 38.8  MCV 85.1 81.4 83.4  PLT 185 195 185    Basic Metabolic Panel: Recent Labs  Lab 06/28/22 0244 06/28/22 1330 06/29/22 1416 07/01/22 0554  NA 142  --  137 134*  K 4.3  --   4.9 5.1  CL 94*  --  92* 91*  CO2 28  --  31 25  GLUCOSE 185*  --  83 269*  BUN 31*  --  53* 51*  CREATININE 4.68*  --  6.80* 6.19*  CALCIUM 9.3  --  9.3 8.7*  MG  --  2.2  --   --   PHOS  --  4.6  --  4.4    GFR: Estimated Creatinine Clearance: 9.1 mL/min (A) (by C-G formula based on SCr of 6.19 mg/dL (H)).  Liver Function Tests: Recent Labs  Lab 06/28/22 0244 06/29/22 1416 07/01/22 0554  AST 20 15  --   ALT 10 9  --   ALKPHOS 101 92  --   BILITOT 0.3 0.4  --   PROT 7.6 6.8  --   ALBUMIN 2.7* 2.4* 2.4*    CBG: Recent Labs  Lab 06/30/22 1129 06/30/22 1552 06/30/22 2041 07/01/22 0827 07/01/22 0848  GLUCAP 100* 148* 188* 274* 258*     Recent Results (from the past 240 hour(s))  Blood Cultures x 2 sites     Status: None (Preliminary result)   Collection Time: 06/27/22  2:44 AM   Specimen: BLOOD LEFT HAND  Result Value Ref Range Status   Specimen Description BLOOD LEFT HAND  Final   Special Requests   Final    BOTTLES DRAWN AEROBIC AND ANAEROBIC Blood Culture results may not be optimal due to an inadequate volume of blood received in culture bottles   Culture   Final    NO GROWTH 3 DAYS Performed at [facility], [place] [address], [place], [place] [address]    Report Status PENDING  Incomplete  Blood Cultures x 2 sites     Status: None (Preliminary result)   Collection Time: 06/28/22  6:25 AM   Specimen: BLOOD RIGHT ARM  Result Value Ref Range Status   Specimen Description BLOOD RIGHT ARM  Final   Special Requests   Final    BOTTLES DRAWN AEROBIC AND ANAEROBIC Blood Culture adequate volume   Culture   Final    NO GROWTH 3 DAYS Performed at [facility], [place] [address] [place], [place] [address]    Report Status PENDING  Incomplete         Radiology Studies: No results found.      Scheduled Meds:  atorvastatin  40 mg Oral Daily   Chlorhexidine Gluconate Cloth  6 each Topical Q0600   cinacalcet  90 mg Oral Daily    dorzolamide-timolol  1 drop Left Eye BID   feeding supplement (NEPRO CARB STEADY)  237 mL Oral TID BM   heparin  5,000 Units Subcutaneous Q8H   insulin aspart  0-5 Units Subcutaneous QHS   insulin aspart  0-6 Units Subcutaneous TID WC   leptospermum manuka honey  1 Application Topical Daily   midodrine  5 mg Oral TID WC   multivitamin  with minerals  1 tablet Oral q morning   nystatin  5 mL Oral QID   sennosides  5 mL Per Tube BID   sodium chloride flush  3 mL Intravenous Q12H   thiamine  100 mg Per Tube Daily   Continuous Infusions:  sodium chloride     ceFEPime (MAXIPIME) IV Stopped (07/01/22 0647)   vancomycin       LOS: 3 days      Phillips Climes, MD [facility]   To contact the attending provider between 7A-7P or the covering provider during after hours 7P-7A, please log into the web site [email or web address] and access using universal [place] password for that web site. If you do not have the password, please call the hospital operator.  07/01/2022, 9:38 AM

## 2022-07-01 NOTE — Progress Notes (Signed)
[facility] Progress Note   Subjective:   Patient seen and examined at bedside.  No specific complaints.  Denies CP, SOB, fever, chills, abdominal pain and n/v/d. Plan for HD today.    Objective Vitals:   06/30/22 2010 07/01/22 0425 07/01/22 0500 07/01/22 0823  BP: (!) 140/49 (!) 160/55  (!) 133/56  Pulse: 72 70  71  Resp: 16 15  17   Temp: 98.9 F (37.2 C) 98.8 F (37.1 C)  98.5 F (36.9 C)  TempSrc: Oral   Oral  SpO2: 100% 100%  98%  Weight:   54.8 kg   Height:       Physical Exam General:Chronically ill appearing female in NAD Heart:RRR, no mrg Lungs:CTAB, nml WOB on RA Abdomen:soft, NTND Extremities:no LE edema, R BKA, L foot dressed Dialysis Access: RU AVF +b/t   Filed Weights   06/29/22 0500 06/29/22 1756 07/01/22 0500  Weight: 52.5 kg 52.3 kg 54.8 kg   No intake or output data in the 24 hours ending 07/01/22 0837  Additional Objective Labs: Basic Metabolic Panel: Recent Labs  Lab 06/28/22 0244 06/28/22 1330 06/29/22 1416 07/01/22 0554  NA 142  --  137 134*  K 4.3  --  4.9 5.1  CL 94*  --  92* 91*  CO2 28  --  31 25  GLUCOSE 185*  --  83 269*  BUN 31*  --  53* 51*  CREATININE 4.68*  --  6.80* 6.19*  CALCIUM 9.3  --  9.3 8.7*  PHOS  --  4.6  --  4.4   Liver Function Tests: Recent Labs  Lab 06/28/22 0244 06/29/22 1416 07/01/22 0554  AST 20 15  --   ALT 10 9  --   ALKPHOS 101 92  --   BILITOT 0.3 0.4  --   PROT 7.6 6.8  --   ALBUMIN 2.7* 2.4* 2.4*   CBC: Recent Labs  Lab 06/28/22 0244 06/29/22 1416 07/01/22 0554  WBC 8.0 8.1 7.9  NEUTROABS 5.4  --   --   HGB 11.8* 11.1* 12.1  HCT 38.9 35.1* 38.8  MCV 85.1 81.4 83.4  PLT 185 195 175   Blood Culture    Component Value Date/Time   SDES BLOOD RIGHT ARM 06/28/2022 0625   SPECREQUEST  06/28/2022 0625    BOTTLES DRAWN AEROBIC AND ANAEROBIC Blood Culture adequate volume   CULT  06/28/2022 0625    NO GROWTH 3 DAYS Performed at [facility], [place] [address],  [place], [place] [address]    REPTSTATUS PENDING 06/28/2022 0625    CBG: Recent Labs  Lab 06/29/22 2127 06/30/22 0738 06/30/22 1129 06/30/22 1552 06/30/22 2041  GLUCAP 169* 169* 100* 148* 188*    Medications:  sodium chloride     ceFEPime (MAXIPIME) IV Stopped (07/01/22 0647)   [START ON 07/02/2022] vancomycin      atorvastatin  40 mg Oral Daily   Chlorhexidine Gluconate Cloth  6 each Topical Q0600   cinacalcet  90 mg Oral Daily   dorzolamide-timolol  1 drop Left Eye BID   feeding supplement (NEPRO CARB STEADY)  237 mL Oral TID BM   heparin  5,000 Units Subcutaneous Q8H   insulin aspart  0-5 Units Subcutaneous QHS   insulin aspart  0-6 Units Subcutaneous TID WC   leptospermum manuka honey  1 Application Topical Daily   midodrine  5 mg Oral TID WC   multivitamin with minerals  1 tablet Oral q morning  nystatin  5 mL Oral QID   sennosides  5 mL Per Tube BID   sodium chloride flush  3 mL Intravenous Q12H   thiamine  100 mg Per Tube Daily    Dialysis Orders: [facility] MWF   3h 61min  400/1.5   51kg  2/2 bath  Hep none RUA AVF - mircera 225 ug q2, last 11/10, due 11/24 - last HD 11/15 post wt 52.5kg     Na 142  K 4.3  CO2 28  BUN 31  Creat 4.6  alb 2.7  LFT's okay  Hb 11.8    WBC 8K   LA 3.5, 3.2, CRP 6.9    Assessment/ Plan: L foot acute osteomyelitis - w/ underlying PAD, started on IV abx, Plan for AKA Monday. ESRD - on HD MWF.  Next HD today d/t holiday schedule.  Week of Thanksgiving Holiday MWF patients will run on an alternate schedule: Sunday, Tuesday, Friday.   BP- variable.  cont midodrine for chronic hypotension Volume - Does not appear volume overloaded.  UF as tolerated.  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 not on vdra. Cont sensipar.  DM2 - per pmd 8. Nutrition - renal diet w/fluid restrictions  Jen Mow, PA-C [facility] 07/01/2022,8:37 AM  LOS: 3 days

## 2022-07-01 NOTE — Progress Notes (Signed)
Pharmacy Antibiotic Note  [name] is a 41 y.o. female admitted on 06/27/2022 with  acute osteomyelitis of the left heel .  Pharmacy has been consulted for cefepime and vancomycin dosing. Pt WBC 7.9 is stable and Temp 98.5 (afebrile). Pt will be receiving HD this week due to Holiday on Sunday (11/19), Tuesday, and Friday. Usual HD schedule is MWF. Planning for surgery tomorrow (11/20) for above the left knee amputation.  Plan: Continue Cefepime 1g IV q24h Vancomycin 500 mg IV with HD Plan to follow HD schedule and surgical plans  Monitor renal function, WBC, temp, and cultures.   Height: 5\' 1"  (154.9 cm) Weight: 54.8 kg (120 lb 13 oz) IBW/kg (Calculated) : 47.8  Temp (24hrs), Avg:98.4 F (36.9 C), Min:97.9 F (36.6 C), Max:98.9 F (37.2 C)  Recent Labs  Lab 06/28/22 0244 06/28/22 0625 06/29/22 1416 07/01/22 0554  WBC 8.0  --  8.1 7.9  CREATININE 4.68*  --  6.80* 6.19*  LATICACIDVEN 3.5* 3.2*  --   --     Estimated Creatinine Clearance: 9.1 mL/min (A) (by C-G formula based on SCr of 6.19 mg/dL (H)).    Allergies  Allergen Reactions   Morphine Rash and Other (See Comments)   Peanut-Containing Drug Products Anaphylaxis and Hives   Penicillins Rash and Other (See Comments)    Rash in 2008.  Tolerated cefazolin in 2020   Chocolate Hives    Antimicrobials this admission: Cefepime 11/16 >>  Vancomycin 11/16 >>   Dose adjustments this admission: No dose adjustments.  Microbiology results: 11/15 Bcx: NG 3 days 11/16 BCx: NG 3 days   Thank you for allowing pharmacy to be a part of this patient's care.  [place]ray [place] Intern 07/01/2022 7:36 AM

## 2022-07-02 ENCOUNTER — Other Ambulatory Visit: Payer: Self-pay

## 2022-07-02 ENCOUNTER — Inpatient Hospital Stay (HOSPITAL_COMMUNITY): Payer: Medicare Other | Admitting: Certified Registered"

## 2022-07-02 ENCOUNTER — Encounter (HOSPITAL_COMMUNITY): Payer: Medicare Other

## 2022-07-02 ENCOUNTER — Encounter (HOSPITAL_COMMUNITY): Payer: Self-pay | Admitting: Internal Medicine

## 2022-07-02 ENCOUNTER — Encounter (HOSPITAL_COMMUNITY)
Admission: EM | Disposition: A | Discharge status: Skilled Nursing Facility | Payer: Self-pay | Source: Skilled Nursing Facility | Attending: Internal Medicine

## 2022-07-02 DIAGNOSIS — E1169 Type 2 diabetes mellitus with other specified complication: Secondary | ICD-10-CM

## 2022-07-02 DIAGNOSIS — M869 Osteomyelitis, unspecified: Secondary | ICD-10-CM

## 2022-07-02 DIAGNOSIS — N186 End stage renal disease: Secondary | ICD-10-CM

## 2022-07-02 DIAGNOSIS — Z992 Dependence on renal dialysis: Secondary | ICD-10-CM

## 2022-07-02 DIAGNOSIS — E1122 Type 2 diabetes mellitus with diabetic chronic kidney disease: Secondary | ICD-10-CM

## 2022-07-02 DIAGNOSIS — Z794 Long term (current) use of insulin: Secondary | ICD-10-CM

## 2022-07-02 DIAGNOSIS — I120 Hypertensive chronic kidney disease with stage 5 chronic kidney disease or end stage renal disease: Secondary | ICD-10-CM

## 2022-07-02 DIAGNOSIS — M86172 Other acute osteomyelitis, left ankle and foot: Secondary | ICD-10-CM | POA: Diagnosis not present

## 2022-07-02 DIAGNOSIS — D631 Anemia in chronic kidney disease: Secondary | ICD-10-CM

## 2022-07-02 HISTORY — PX: AMPUTATION: SHX166

## 2022-07-02 LAB — GLUCOSE, CAPILLARY
Glucose-Capillary: 277 mg/dL — ABNORMAL HIGH (ref 70–99)
Glucose-Capillary: 274 mg/dL — ABNORMAL HIGH (ref 70–99)
Glucose-Capillary: 166 mg/dL — ABNORMAL HIGH (ref 70–99)
Glucose-Capillary: 112 mg/dL — ABNORMAL HIGH (ref 70–99)
Glucose-Capillary: 88 mg/dL (ref 70–99)
Glucose-Capillary: 95 mg/dL (ref 70–99)

## 2022-07-02 LAB — POCT I-STAT, CHEM 8
BUN: 35 mg/dL — ABNORMAL HIGH (ref 6–20)
Calcium, Ion: 0.98 mmol/L — ABNORMAL LOW (ref 1.15–1.40)
Chloride: 96 mmol/L — ABNORMAL LOW (ref 98–111)
Creatinine, Ser: 5.5 mg/dL — ABNORMAL HIGH (ref 0.44–1.00)
Glucose, Bld: 266 mg/dL — ABNORMAL HIGH (ref 70–99)
HCT: 45 % (ref 36.0–46.0)
Hemoglobin: 15.3 g/dL — ABNORMAL HIGH (ref 12.0–15.0)
Potassium: 4.7 mmol/L (ref 3.5–5.1)
Sodium: 133 mmol/L — ABNORMAL LOW (ref 135–145)
TCO2: 29 mmol/L (ref 22–32)

## 2022-07-02 LAB — SURGICAL PCR SCREEN
MRSA, PCR: NEGATIVE
Staphylococcus aureus: NEGATIVE

## 2022-07-02 SURGERY — AMPUTATION, ABOVE KNEE
Anesthesia: General | Site: Leg Upper | Laterality: Left

## 2022-07-02 MED ORDER — PHENYLEPHRINE 80 MCG/ML (10ML) SYRINGE FOR IV PUSH (FOR BLOOD PRESSURE SUPPORT)
PREFILLED_SYRINGE | INTRAVENOUS | Status: AC
  Filled 2022-07-02: qty 10

## 2022-07-02 MED ORDER — PROMETHAZINE HCL 25 MG/ML IJ SOLN: 6.2500 mg | INTRAMUSCULAR | Status: DC | PRN

## 2022-07-02 MED ORDER — CEFAZOLIN SODIUM-DEXTROSE 2-3 GM-%(50ML) IV SOLR
INTRAVENOUS | Status: DC | PRN
  Administered 2022-07-02: 2 g via INTRAVENOUS

## 2022-07-02 MED ORDER — KETAMINE HCL 10 MG/ML IJ SOLN
INTRAMUSCULAR | Status: DC | PRN
  Administered 2022-07-02: 30 mg via INTRAVENOUS

## 2022-07-02 MED ORDER — KETAMINE HCL 50 MG/5ML IJ SOSY
PREFILLED_SYRINGE | INTRAMUSCULAR | Status: AC
  Filled 2022-07-02: qty 5

## 2022-07-02 MED ORDER — FENTANYL CITRATE (PF) 250 MCG/5ML IJ SOLN
INTRAMUSCULAR | Status: AC
  Filled 2022-07-02: qty 5

## 2022-07-02 MED ORDER — HYDROCODONE-ACETAMINOPHEN 7.5-325 MG PO TABS
1.0000 | ORAL_TABLET | ORAL | Status: DC | PRN
  Administered 2022-07-02 – 2022-07-03 (×4): 1 via ORAL
  Administered 2022-07-04: 2 via ORAL
  Filled 2022-07-02 (×5): qty 1
  Filled 2022-07-02: qty 2

## 2022-07-02 MED ORDER — PHENYLEPHRINE HCL-NACL 20-0.9 MG/250ML-% IV SOLN
INTRAVENOUS | Status: DC | PRN
  Administered 2022-07-02: 50 ug/min via INTRAVENOUS

## 2022-07-02 MED ORDER — POTASSIUM CHLORIDE CRYS ER 20 MEQ PO TBCR: 20.0000 meq | EXTENDED_RELEASE_TABLET | Freq: Once | ORAL | Status: DC | PRN

## 2022-07-02 MED ORDER — VASOPRESSIN 20 UNIT/ML IV SOLN
INTRAVENOUS | Status: DC | PRN
  Administered 2022-07-02: .5 [IU] via INTRAVENOUS

## 2022-07-02 MED ORDER — LIDOCAINE 2% (20 MG/ML) 5 ML SYRINGE
INTRAMUSCULAR | Status: DC | PRN
  Administered 2022-07-02: 40 mg via INTRAVENOUS

## 2022-07-02 MED ORDER — LIDOCAINE 2% (20 MG/ML) 5 ML SYRINGE
INTRAMUSCULAR | Status: AC
  Filled 2022-07-02: qty 5

## 2022-07-02 MED ORDER — FENTANYL CITRATE (PF) 250 MCG/5ML IJ SOLN
INTRAMUSCULAR | Status: DC | PRN
  Administered 2022-07-02: 50 ug via INTRAVENOUS
  Administered 2022-07-02: 25 ug via INTRAVENOUS
  Administered 2022-07-02: 50 ug via INTRAVENOUS
  Administered 2022-07-02: 25 ug via INTRAVENOUS
  Administered 2022-07-02: 50 ug via INTRAVENOUS
  Administered 2022-07-02 (×2): 25 ug via INTRAVENOUS

## 2022-07-02 MED ORDER — ALUM & MAG HYDROXIDE-SIMETH 200-200-20 MG/5ML PO SUSP: 15.0000 mL | ORAL | Status: DC | PRN

## 2022-07-02 MED ORDER — HYDRALAZINE HCL 20 MG/ML IJ SOLN: 5.0000 mg | INTRAMUSCULAR | Status: DC | PRN

## 2022-07-02 MED ORDER — PROPOFOL 10 MG/ML IV BOLUS
INTRAVENOUS | Status: AC
  Filled 2022-07-02: qty 20

## 2022-07-02 MED ORDER — MORPHINE SULFATE (PF) 2 MG/ML IV SOLN: 0.5000 mg | INTRAVENOUS | Status: DC | PRN

## 2022-07-02 MED ORDER — ACETAMINOPHEN 325 MG PO TABS: 325.0000 mg | ORAL_TABLET | Freq: Four times a day (QID) | ORAL | Status: DC | PRN

## 2022-07-02 MED ORDER — GUAIFENESIN-DM 100-10 MG/5ML PO SYRP: 15.0000 mL | ORAL_SOLUTION | ORAL | Status: DC | PRN

## 2022-07-02 MED ORDER — FENTANYL CITRATE (PF) 100 MCG/2ML IJ SOLN
INTRAMUSCULAR | Status: AC
  Filled 2022-07-02: qty 2

## 2022-07-02 MED ORDER — MAGNESIUM SULFATE 2 GM/50ML IV SOLN: 2.0000 g | Freq: Once | INTRAVENOUS | Status: DC | PRN

## 2022-07-02 MED ORDER — EPHEDRINE SULFATE-NACL 50-0.9 MG/10ML-% IV SOSY
PREFILLED_SYRINGE | INTRAVENOUS | Status: DC | PRN
  Administered 2022-07-02: 10 mg via INTRAVENOUS

## 2022-07-02 MED ORDER — ORAL CARE MOUTH RINSE: 15.0000 mL | Freq: Once | OROMUCOSAL | Status: AC

## 2022-07-02 MED ORDER — GLYCOPYRROLATE PF 0.2 MG/ML IJ SOSY
PREFILLED_SYRINGE | INTRAMUSCULAR | Status: DC | PRN
  Administered 2022-07-02: .2 mg via INTRAVENOUS

## 2022-07-02 MED ORDER — ONDANSETRON HCL 4 MG/2ML IJ SOLN
INTRAMUSCULAR | Status: AC
  Filled 2022-07-02: qty 2

## 2022-07-02 MED ORDER — BACITRACIN ZINC 500 UNIT/GM EX OINT
TOPICAL_OINTMENT | CUTANEOUS | Status: DC | PRN
  Administered 2022-07-02: 1 via TOPICAL

## 2022-07-02 MED ORDER — ACETAMINOPHEN 500 MG PO TABS: 1000.0000 mg | ORAL_TABLET | Freq: Once | ORAL | Status: DC

## 2022-07-02 MED ORDER — HYDROCODONE-ACETAMINOPHEN 5-325 MG PO TABS
1.0000 | ORAL_TABLET | ORAL | Status: DC | PRN
  Administered 2022-07-03: 2 via ORAL
  Administered 2022-07-03: 1 via ORAL
  Administered 2022-07-04: 2 via ORAL
  Filled 2022-07-02 (×2): qty 2
  Filled 2022-07-02: qty 1

## 2022-07-02 MED ORDER — ONDANSETRON HCL 4 MG/2ML IJ SOLN
4.0000 mg | Freq: Four times a day (QID) | INTRAMUSCULAR | Status: DC | PRN
  Administered 2022-07-02: 4 mg via INTRAVENOUS
  Filled 2022-07-02: qty 2

## 2022-07-02 MED ORDER — LABETALOL HCL 5 MG/ML IV SOLN: 10.0000 mg | INTRAVENOUS | Status: DC | PRN

## 2022-07-02 MED ORDER — CEFAZOLIN SODIUM 1 G IJ SOLR
INTRAMUSCULAR | Status: AC
  Filled 2022-07-02: qty 20

## 2022-07-02 MED ORDER — DEXMEDETOMIDINE HCL IN NACL 80 MCG/20ML IV SOLN
INTRAVENOUS | Status: AC
  Filled 2022-07-02: qty 20

## 2022-07-02 MED ORDER — VASOPRESSIN 20 UNIT/ML IV SOLN
INTRAVENOUS | Status: AC
  Filled 2022-07-02: qty 1

## 2022-07-02 MED ORDER — CHLORHEXIDINE GLUCONATE 0.12 % MT SOLN
15.0000 mL | Freq: Once | OROMUCOSAL | Status: AC
  Administered 2022-07-02: 15 mL via OROMUCOSAL

## 2022-07-02 MED ORDER — MUPIROCIN 2 % EX OINT
1.0000 | TOPICAL_OINTMENT | Freq: Two times a day (BID) | CUTANEOUS | Status: DC
  Administered 2022-07-02 – 2022-07-04 (×3): 1 via NASAL
  Filled 2022-07-02 (×2): qty 22

## 2022-07-02 MED ORDER — PANTOPRAZOLE SODIUM 40 MG PO TBEC
40.0000 mg | DELAYED_RELEASE_TABLET | Freq: Every day | ORAL | Status: DC
  Administered 2022-07-02 – 2022-07-04 (×3): 40 mg via ORAL
  Filled 2022-07-02 (×3): qty 1

## 2022-07-02 MED ORDER — GLYCOPYRROLATE PF 0.2 MG/ML IJ SOSY
PREFILLED_SYRINGE | INTRAMUSCULAR | Status: AC
  Filled 2022-07-02: qty 1

## 2022-07-02 MED ORDER — METOPROLOL TARTRATE 5 MG/5ML IV SOLN: 2.0000 mg | INTRAVENOUS | Status: DC | PRN

## 2022-07-02 MED ORDER — PHENYLEPHRINE 80 MCG/ML (10ML) SYRINGE FOR IV PUSH (FOR BLOOD PRESSURE SUPPORT)
PREFILLED_SYRINGE | INTRAVENOUS | Status: DC | PRN
  Administered 2022-07-02 (×2): 80 ug via INTRAVENOUS
  Administered 2022-07-02: 160 ug via INTRAVENOUS
  Administered 2022-07-02 (×2): 80 ug via INTRAVENOUS

## 2022-07-02 MED ORDER — ALBUMIN HUMAN 5 % IV SOLN: INTRAVENOUS | Status: DC | PRN

## 2022-07-02 MED ORDER — DOCUSATE SODIUM 100 MG PO CAPS
100.0000 mg | ORAL_CAPSULE | Freq: Every day | ORAL | Status: DC
  Administered 2022-07-03 – 2022-07-04 (×2): 100 mg via ORAL
  Filled 2022-07-02 (×2): qty 1

## 2022-07-02 MED ORDER — DEXMEDETOMIDINE HCL IN NACL 80 MCG/20ML IV SOLN
INTRAVENOUS | Status: DC | PRN
  Administered 2022-07-02: 8 ug via BUCCAL
  Administered 2022-07-02: 4 ug via BUCCAL

## 2022-07-02 MED ORDER — EPHEDRINE 5 MG/ML INJ
INTRAVENOUS | Status: AC
  Filled 2022-07-02: qty 5

## 2022-07-02 MED ORDER — PROPOFOL 10 MG/ML IV BOLUS
INTRAVENOUS | Status: DC | PRN
  Administered 2022-07-02: 150 mg via INTRAVENOUS
  Administered 2022-07-02: 50 mg via INTRAVENOUS

## 2022-07-02 MED ORDER — MIDAZOLAM HCL 2 MG/2ML IJ SOLN
INTRAMUSCULAR | Status: DC | PRN
  Administered 2022-07-02: 2 mg via INTRAVENOUS

## 2022-07-02 MED ORDER — MIDAZOLAM HCL 2 MG/2ML IJ SOLN
INTRAMUSCULAR | Status: AC
  Filled 2022-07-02: qty 2

## 2022-07-02 MED ORDER — HEPARIN SODIUM (PORCINE) 5000 UNIT/ML IJ SOLN
5000.0000 [IU] | Freq: Three times a day (TID) | INTRAMUSCULAR | Status: DC
  Administered 2022-07-02 – 2022-07-04 (×6): 5000 [IU] via SUBCUTANEOUS
  Filled 2022-07-02 (×5): qty 1

## 2022-07-02 MED ORDER — FENTANYL CITRATE (PF) 100 MCG/2ML IJ SOLN
25.0000 ug | INTRAMUSCULAR | Status: DC | PRN
  Administered 2022-07-02 (×3): 50 ug via INTRAVENOUS

## 2022-07-02 MED ORDER — FENTANYL CITRATE (PF) 100 MCG/2ML IJ SOLN
50.0000 ug | Freq: Once | INTRAMUSCULAR | Status: AC
  Administered 2022-07-02: 50 ug via INTRAVENOUS

## 2022-07-02 MED ORDER — ONDANSETRON HCL 4 MG/2ML IJ SOLN
INTRAMUSCULAR | Status: DC | PRN
  Administered 2022-07-02: 4 mg via INTRAVENOUS

## 2022-07-02 MED ORDER — INSULIN ASPART 100 UNIT/ML IJ SOLN
0.0000 [IU] | INTRAMUSCULAR | Status: DC | PRN
  Administered 2022-07-02: 8 [IU] via SUBCUTANEOUS
  Filled 2022-07-02: qty 1

## 2022-07-02 MED ORDER — VANCOMYCIN HCL 500 MG/100ML IV SOLN
500.0000 mg | Freq: Once | INTRAVENOUS | Status: AC
  Administered 2022-07-03: 500 mg via INTRAVENOUS
  Filled 2022-07-02: qty 100

## 2022-07-02 MED ORDER — SODIUM CHLORIDE 0.9 % IV SOLN: INTRAVENOUS | Status: DC

## 2022-07-02 MED ORDER — PHENOL 1.4 % MT LIQD: 1.0000 | OROMUCOSAL | Status: DC | PRN

## 2022-07-02 MED ORDER — 0.9 % SODIUM CHLORIDE (POUR BTL) OPTIME
TOPICAL | Status: DC | PRN
  Administered 2022-07-02: 1000 mL

## 2022-07-02 SURGICAL SUPPLY — 58 items
BAG COUNTER SPONGE SURGICOUNT (BAG) ×1 IMPLANT
BAG SPNG CNTER NS LX DISP (BAG) ×1
BANDAGE ESMARK 6X9 LF (GAUZE/BANDAGES/DRESSINGS) ×1 IMPLANT
BLADE SAW RECIP 87.9 MT (BLADE) ×1 IMPLANT
BNDG CMPR 9X6 STRL LF SNTH (GAUZE/BANDAGES/DRESSINGS) ×1
BNDG COHESIVE 6X5 TAN STRL LF (GAUZE/BANDAGES/DRESSINGS) ×1 IMPLANT
BNDG ELASTIC 4X5.8 VLCR STR LF (GAUZE/BANDAGES/DRESSINGS) ×2 IMPLANT
BNDG ESMARK 6X9 LF (GAUZE/BANDAGES/DRESSINGS) ×1
BNDG GAUZE DERMACEA FLUFF 4 (GAUZE/BANDAGES/DRESSINGS) ×1 IMPLANT
BNDG GZE DERMACEA 4 6PLY (GAUZE/BANDAGES/DRESSINGS) ×1
CANISTER SUCT 3000ML PPV (MISCELLANEOUS) ×1 IMPLANT
COVER SURGICAL LIGHT HANDLE (MISCELLANEOUS) ×1 IMPLANT
CUFF TOURN SGL QUICK 18X4 (TOURNIQUET CUFF) IMPLANT
CUFF TOURN SGL QUICK 24 (TOURNIQUET CUFF)
CUFF TOURN SGL QUICK 34 (TOURNIQUET CUFF) ×1
CUFF TOURN SGL QUICK 42 (TOURNIQUET CUFF) IMPLANT
CUFF TRNQT CYL 24X4X16.5-23 (TOURNIQUET CUFF) IMPLANT
CUFF TRNQT CYL 34X4.125X (TOURNIQUET CUFF) IMPLANT
DRAIN CHANNEL 19F RND (DRAIN) IMPLANT
DRAPE HALF SHEET 40X57 (DRAPES) ×1 IMPLANT
DRAPE INCISE IOBAN 66X45 STRL (DRAPES) ×1 IMPLANT
DRAPE ORTHO SPLIT 77X108 STRL (DRAPES) ×2
DRAPE SURG ORHT 6 SPLT 77X108 (DRAPES) ×2 IMPLANT
DRAPE U-SHAPE 47X51 STRL (DRAPES) ×1 IMPLANT
DRSG ADAPTIC 3X8 NADH LF (GAUZE/BANDAGES/DRESSINGS) ×1 IMPLANT
ELECT CAUTERY BLADE 6.4 (BLADE) ×1 IMPLANT
ELECT REM PT RETURN 9FT ADLT (ELECTROSURGICAL) ×1
ELECTRODE REM PT RTRN 9FT ADLT (ELECTROSURGICAL) ×1 IMPLANT
EVACUATOR SILICONE 100CC (DRAIN) IMPLANT
GAUZE SPONGE 4X4 12PLY STRL (GAUZE/BANDAGES/DRESSINGS) ×1 IMPLANT
GLOVE BIO SURGEON STRL SZ7.5 (GLOVE) ×1 IMPLANT
GLOVE BIOGEL PI IND STRL 8 (GLOVE) ×1 IMPLANT
GLOVE SURG POLY ORTHO LF SZ7.5 (GLOVE) IMPLANT
GLOVE SURG UNDER LTX SZ8 (GLOVE) ×1 IMPLANT
GOWN STRL REUS W/ TWL LRG LVL3 (GOWN DISPOSABLE) ×3 IMPLANT
GOWN STRL REUS W/TWL LRG LVL3 (GOWN DISPOSABLE) ×3
KIT BASIN OR (CUSTOM PROCEDURE TRAY) ×1 IMPLANT
KIT TURNOVER KIT B (KITS) ×1 IMPLANT
NS IRRIG 1000ML POUR BTL (IV SOLUTION) ×1 IMPLANT
PACK GENERAL/GYN (CUSTOM PROCEDURE TRAY) ×1 IMPLANT
PAD ARMBOARD 7.5X6 YLW CONV (MISCELLANEOUS) ×2 IMPLANT
RASP HELIOCORDIAL MED (MISCELLANEOUS) IMPLANT
STAPLER VISISTAT 35W (STAPLE) ×1 IMPLANT
STOCKINETTE IMPERVIOUS LG (DRAPES) ×1 IMPLANT
SUT ETHILON 3 0 PS 1 (SUTURE) IMPLANT
SUT SILK 0 TIES 10X30 (SUTURE) ×1 IMPLANT
SUT SILK 2 0 (SUTURE) ×1
SUT SILK 2 0 SH CR/8 (SUTURE) ×1 IMPLANT
SUT SILK 2-0 18XBRD TIE 12 (SUTURE) ×1 IMPLANT
SUT SILK 3 0 (SUTURE) ×1
SUT SILK 3-0 18XBRD TIE 12 (SUTURE) ×1 IMPLANT
SUT VIC AB 2-0 CT1 18 (SUTURE) ×1 IMPLANT
SUT VIC AB 3-0 SH 27 (SUTURE) ×1
SUT VIC AB 3-0 SH 27X BRD (SUTURE) IMPLANT
TOWEL GREEN STERILE (TOWEL DISPOSABLE) ×2 IMPLANT
TOWEL GREEN STERILE FF (TOWEL DISPOSABLE) ×1 IMPLANT
UNDERPAD 30X36 HEAVY ABSORB (UNDERPADS AND DIAPERS) ×1 IMPLANT
WATER STERILE IRR 1000ML POUR (IV SOLUTION) ×1 IMPLANT

## 2022-07-02 NOTE — Anesthesia Postprocedure Evaluation (Signed)
Anesthesia Post Note  Patient: Delaynie Stetzer  Procedure(s) Performed: RIGHT ABOVE KNEEAMPUTATION (Left: Leg Upper)     Patient location during evaluation: PACU Anesthesia Type: General Level of consciousness: awake and alert Pain management: pain level controlled Vital Signs Assessment: post-procedure vital signs reviewed and stable Respiratory status: spontaneous breathing, nonlabored ventilation, respiratory function stable and patient connected to nasal cannula oxygen Cardiovascular status: blood pressure returned to baseline and stable Postop Assessment: no apparent nausea or vomiting Anesthetic complications: no  No notable events documented.  Last Vitals:  Vitals:   07/02/22 1200 07/02/22 1220  BP: 138/86 (!) 174/87  Pulse: 79 79  Resp: (!) 0 18  Temp: 37.1 C 36.9 C  SpO2: 96% 100%    Last Pain:  Vitals:   07/02/22 1220  TempSrc: Oral  PainSc:                  Tiajuana Amass

## 2022-07-02 NOTE — Transfer of Care (Signed)
Immediate Anesthesia Transfer of Care Note  Patient: [name]  Procedure(s) Performed: RIGHT ABOVE KNEEAMPUTATION (Left: Leg Upper)  Patient Location: PACU  Anesthesia Type:General  Level of Consciousness: drowsy, patient cooperative, lethargic, and responds to stimulation  Airway & Oxygen Therapy: Patient Spontanous Breathing and Patient connected to face mask oxygen  Post-op Assessment: Report given to RN and Post -op Vital signs reviewed and stable  Post vital signs: Reviewed and stable  Last Vitals:  Vitals Value Taken Time  BP 183/90 07/02/22 1109  Temp    Pulse 78 07/02/22 1112  Resp 14 07/02/22 1113  SpO2 100 % 07/02/22 1112  Vitals shown include unvalidated device data.  Last Pain:  Vitals:   07/02/22 0803  TempSrc: Oral  PainSc: 8       Patients Stated Pain Goal: 0 (54/36/06 7703)  Complications: No notable events documented.

## 2022-07-02 NOTE — Progress Notes (Signed)
PROGRESS NOTE    [name]  ZHY:[id number] DOB: 1980/11/19 DOA: 06/27/2022 PCP: [facility], [facility]    Chief Complaint  Patient presents with   Wound Check    Brief Narrative:   [name] is a 41 y.o. 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  She is also on chronic dialysis every Monday Wednesday Friday, she has peripheral vascular disease and anemia and chronic kidney disease as well as secondary hyperparathyroidism.  Patient has a known chronic left heel wound.  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  Patient was sent from the nursing facility because of increasing pain and drainage from the left heel wound.  She had been placed on doxycycline for the past 2 days without any improvement in symptoms.  Work-up significant for acute osteomyelitis, she was admitted for further work-up.      Assessment & Plan:   Principal Problem:   Osteomyelitis of ankle or foot, acute, left (HCC) Active Problems:   PVD (peripheral vascular disease) (HCC)   Secondary hyperparathyroidism of renal origin ([place])   ESRD on hemodialysis ([place])   Type 2 diabetes mellitus with hyperglycemia, with long-term current use of insulin (HCC)   Chronic left heel wound now with acute osteomyelitis - Patient with worsening of chronic left heel wound now with evidence of acute osteomyelitis and drainage -She is with known severe peripheral vascular disease, vascular surgery consulted, plan for AKA today -Continue with broad-spectrum IV antibiotic coverage -Follow on blood cultures, remains negative to date. -Continue with pain meds as needed    ESRD - renal consulted, she will  be dialyzed today, and next HD on  Tuesday(holidaY schedule)  Diabetes mellitus on insulin -Was hyperglycemic on admission, but she was hypoglycemic thereafter, with poor appetite, so long-acting insulin has been discontinued, will keep on insulin sliding scale meanwhile. -Appetite is poor , will start on Nepro.  -Diabetes is brittle, given she is n.p.o. today for surgery we will hold on resuming Semglee.  Hypertension Controlled with volume management/hemodialysis   PVD history of prior right AKA Management per vascular surgery   History of CVA Continue secondary prevention with Lipitor     DVT prophylaxis: Heparin Code Status: (Full) Family Communication: none at bedside Disposition:   Status is: Inpatient    Consultants:  Renal  Vascular  Subjective:  Significant events overnight, she denies any complaints today.  Objective: Vitals:   07/01/22 2030 07/01/22 2315 07/02/22 0533 07/02/22 0803  BP: (!) 161/53 (!) 121/47 (!) 139/44 (!) 127/95  Pulse: 76 72 68 68  Resp: (!) 9 14 17 16   Temp:  98.6 F (37 C) 98.2 F (36.8 C) 99 F (37.2 C)  TempSrc:  Oral  Oral  SpO2: 100% 100% 100% 99%  Weight:      Height:        Intake/Output Summary (Last 24 hours) at 07/02/2022 0904 Last data filed at 07/01/2022 2030 Gross per 24 hour  Intake --  Output 1200 ml  Net -1200 ml    Filed Weights   06/29/22 0500 06/29/22 1756 07/01/22 0500  Weight: 52.5 kg 52.3 kg 54.8 kg    Examination:   Awake Alert, Oriented X 3, She is blind in the right  eye Symmetrical Chest wall movement, Good air movement bilaterally, CTAB RRR,No Gallops,Rubs or new Murmurs, No Parasternal Heave +ve B.Sounds, Abd Soft, No tenderness, No rebound - guarding or rigidity. Right AKA, left foot bandaged    Data Reviewed: I have personally reviewed following labs and imaging studies  CBC: Recent Labs  Lab 06/28/22 0244 06/29/22 1416 07/01/22 0554 07/02/22 0828  WBC 8.0 8.1 7.9  --   NEUTROABS 5.4  --   --   --   HGB  11.8* 11.1* 12.1 15.3*  HCT 38.9 35.1* 38.8 45.0  MCV 85.1 81.4 83.4  --   PLT 185 195 175  --     Basic Metabolic Panel: Recent Labs  Lab 06/28/22 0244 06/28/22 1330 06/29/22 1416 07/01/22 0554 07/02/22 0828  NA 142  --  137 134* 133*  K 4.3  --  4.9 5.1 4.7  CL 94*  --  92* 91* 96*  CO2 28  --  31 25  --   GLUCOSE 185*  --  83 269* 266*  BUN 31*  --  53* 51* 35*  CREATININE 4.68*  --  6.80* 6.19* 5.50*  CALCIUM 9.3  --  9.3 8.7*  --   MG  --  2.2  --   --   --   PHOS  --  4.6  --  4.4  --     GFR: Estimated Creatinine Clearance: 10.3 mL/min (A) (by C-G formula based on SCr of 5.5 mg/dL (H)).  Liver Function Tests: Recent Labs  Lab 06/28/22 0244 06/29/22 1416 07/01/22 0554  AST 20 15  --   ALT 10 9  --   ALKPHOS 101 92  --   BILITOT 0.3 0.4  --   PROT 7.6 6.8  --   ALBUMIN 2.7* 2.4* 2.4*    CBG: Recent Labs  Lab 07/01/22 1136 07/01/22 1615 07/01/22 2203 07/02/22 0723 07/02/22 0800  GLUCAP 213* 181* 97 277* 274*     Recent Results (from the past 240 hour(s))  Blood Cultures x 2 sites     Status: None (Preliminary result)   Collection Time: 06/27/22  2:44 AM   Specimen: BLOOD LEFT HAND  Result Value Ref Range Status   Specimen Description BLOOD LEFT HAND  Final   Special Requests   Final    BOTTLES DRAWN AEROBIC AND ANAEROBIC Blood Culture results may not be optimal due to an inadequate volume of blood received in culture bottles   Culture   Final    NO GROWTH 4 DAYS Performed at [facility], [place] [address] [place], [place] [address]    Report Status PENDING  Incomplete  Blood Cultures x 2 sites     Status: None (Preliminary result)   Collection Time: 06/28/22  6:25 AM   Specimen: BLOOD RIGHT ARM  Result Value Ref Range Status   Specimen Description BLOOD RIGHT ARM  Final   Special Requests   Final    BOTTLES DRAWN AEROBIC AND ANAEROBIC Blood Culture adequate volume   Culture   Final    NO GROWTH 4 DAYS Performed at [facility], [place] [address] [place],  [address]    Report Status PENDING  Incomplete  Surgical PCR screen     Status: None   Collection Time: 07/02/22  5:30 AM   Specimen: Nasal Mucosa; Nasal Swab  Result Value Ref Range Status   MRSA, PCR NEGATIVE NEGATIVE Final   Staphylococcus aureus NEGATIVE NEGATIVE Final  Comment: (NOTE) The Xpert SA Assay (FDA approved for NASAL specimens in patients 72 years of age and older), is one component of a comprehensive surveillance program. It is not intended to diagnose infection nor to guide or monitor treatment. Performed at [facility], [place] [address], [place], [place] [address]          Radiology Studies: No results found.      Scheduled Meds:  acetaminophen  1,000 mg Oral Once   [MAR Hold] atorvastatin  40 mg Oral Daily   [MAR Hold] Chlorhexidine Gluconate Cloth  6 each Topical Q0600   [MAR Hold] cinacalcet  90 mg Oral Daily   [MAR Hold] dorzolamide-timolol  1 drop Left Eye BID   [MAR Hold] feeding supplement (NEPRO CARB STEADY)  237 mL Oral TID BM   fentaNYL       [MAR Hold] heparin  5,000 Units Subcutaneous Q8H   [MAR Hold] insulin aspart  0-5 Units Subcutaneous QHS   [MAR Hold] insulin aspart  0-6 Units Subcutaneous TID WC   [MAR Hold] leptospermum manuka honey  1 Application Topical Daily   [MAR Hold] midodrine  5 mg Oral TID WC   [MAR Hold] multivitamin with minerals  1 tablet Oral q morning   [MAR Hold] mupirocin ointment  1 Application Nasal BID   [MAR Hold] nystatin  5 mL Oral QID   [MAR Hold] sennosides  5 mL Per Tube BID   [MAR Hold] sodium chloride flush  3 mL Intravenous Q12H   [MAR Hold] thiamine  100 mg Per Tube Daily   Continuous Infusions:  [MAR Hold] sodium chloride     sodium chloride 10 mL/hr at 07/02/22 0827   [MAR Hold] ceFEPime (MAXIPIME) IV 1 g (07/01/22 2209)     LOS: 4 days      Phillips Climes, MD [facility]   To contact the attending provider between 7A-7P or the  covering provider during after hours 7P-7A, please log into the web site [email or web address] and access using universal [place] password for that web site. If you do not have the password, please call the hospital operator.  07/02/2022, 9:04 AM

## 2022-07-02 NOTE — Plan of Care (Signed)

## 2022-07-02 NOTE — Inpatient Diabetes Management (Signed)
Inpatient Diabetes Program Recommendations  AACE/ADA: New Consensus Statement on Inpatient Glycemic Control (2015)  Target Ranges:  Prepandial:   less than 140 mg/dL      Peak postprandial:   less than 180 mg/dL (1-2 hours)      Critically ill patients:  140 - 180 mg/dL    Latest Reference Range & Units 07/01/22 08:27 07/01/22 08:48 07/01/22 11:36 07/01/22 16:15 07/01/22 22:03  Glucose-Capillary 70 - 99 mg/dL 274 (H) 258 (H)  3 units Novolog  213 (H)  2 units Novolog  181 (H)  1 unit Novolog  97  (H): Data is abnormally high  Latest Reference Range & Units 07/02/22 07:23 07/02/22 08:00  Glucose-Capillary 70 - 99 mg/dL 277 (H) 274 (H)  8 units Novolog   (H): Data is abnormally high     Home DM Meds: Dexcom G6 CGM       Novolog 1-9 units QID per SSI       Semglee 5 units BID       Metformin 500 mg daily   Current Orders: Novolog 0-6 units TID AC + HS     MD- Note pt having AKA this AM.  AM CBGs have been running >200  Please consider starting SNF dose of basal insulin: Semglee 5 units BID this evening   --Will follow patient during hospitalization--  Wyn Quaker RN, MSN, [place] [facility] Coordinator Inpatient Glycemic Control Team Team Pager: [phone] (8a-5p)

## 2022-07-02 NOTE — Interval H&P Note (Signed)
History and Physical Interval Note:  07/02/2022 9:23 AM  [name]  has presented today for surgery, with the diagnosis of Osteomyelitis of left heel.  The various methods of treatment have been discussed with the patient and family. After consideration of risks, benefits and other options for treatment, the patient has consented to  Procedure(s): AMPUTATION ABOVE KNEE (Left) as a surgical intervention.  The patient's history has been reviewed, patient examined, no change in status, stable for surgery.  I have reviewed the patient's chart and labs.  Questions were answered to the patient's satisfaction.     Deitra Mayo

## 2022-07-02 NOTE — Anesthesia Procedure Notes (Signed)
Procedure Name: LMA Insertion Date/Time: 07/02/2022 9:44 AM  Performed by: Cathren Harsh, CRNAPre-anesthesia Checklist: Patient identified, Emergency Drugs available, Suction available and Patient being monitored Patient Re-evaluated:Patient Re-evaluated prior to induction Oxygen Delivery Method: Circle System Utilized Preoxygenation: Pre-oxygenation with 100% oxygen Induction Type: IV induction Ventilation: Mask ventilation without difficulty LMA: LMA inserted LMA Size: 4.0 Number of attempts: 1 Airway Equipment and Method: Bite block Placement Confirmation: positive ETCO2 Tube secured with: Tape Dental Injury: Teeth and Oropharynx as per pre-operative assessment

## 2022-07-02 NOTE — Anesthesia Preprocedure Evaluation (Signed)
Anesthesia Evaluation  Patient identified by MRN, date of birth, ID band Patient awake    Reviewed: Allergy & Precautions, NPO status , Patient's Chart, lab work & pertinent test results  Airway Mallampati: II  TM Distance: >3 FB Neck ROM: Full    Dental  (+) Dental Advisory Given   Pulmonary shortness of breath   breath sounds clear to auscultation       Cardiovascular hypertension, + Peripheral Vascular Disease   Rhythm:Regular Rate:Normal     Neuro/Psych  Neuromuscular disease CVA    GI/Hepatic negative GI ROS, Neg liver ROS,,,  Endo/Other  diabetes, Type 2, Insulin Dependent    Renal/GU ESRF and DialysisRenal disease     Musculoskeletal   Abdominal   Peds  Hematology  (+) Blood dyscrasia, anemia   Anesthesia Other Findings   Reproductive/Obstetrics                             Anesthesia Physical Anesthesia Plan  ASA: 4  Anesthesia Plan: General   Post-op Pain Management: Tylenol PO (pre-op)*   Induction: Intravenous  PONV Risk Score and Plan: 3 and Dexamethasone, Ondansetron and Treatment may vary due to age or medical condition  Airway Management Planned: LMA  Additional Equipment: None  Intra-op Plan:   Post-operative Plan: Extubation in OR  Informed Consent: I have reviewed the patients History and Physical, chart, labs and discussed the procedure including the risks, benefits and alternatives for the proposed anesthesia with the patient or authorized representative who has indicated his/her understanding and acceptance.     Dental advisory given  Plan Discussed with: CRNA  Anesthesia Plan Comments:        Anesthesia Quick Evaluation

## 2022-07-02 NOTE — Progress Notes (Signed)
[facility] Progress Note   Subjective:    Seen in room S/p L AKA this AM  Objective Vitals:   07/02/22 1130 07/02/22 1145 07/02/22 1200 07/02/22 1220  BP: (!) 178/89 (!) 165/89 138/86 (!) 174/87  Pulse: 80 81 79 79  Resp: (!) 8 10 (!) 0 18  Temp:   98.8 F (37.1 C) 98.4 F (36.9 C)  TempSrc:    Oral  SpO2: 100% 100% 96% 100%  Weight:      Height:       Physical Exam General:Chronically ill appearing female in NAD Heart:RRR, no mrg Lungs:CTAB, nml WOB on RA Abdomen:soft, NTND Extremities:no LE edema, R BKA, L AKA stump with dressing, clean appearing Dialysis Access: RU AVF +b/t   Filed Weights   06/29/22 0500 06/29/22 1756 07/01/22 0500  Weight: 52.5 kg 52.3 kg 54.8 kg    Intake/Output Summary (Last 24 hours) at 07/02/2022 1517 Last data filed at 07/02/2022 1100 Gross per 24 hour  Intake 585 ml  Output 1270 ml  Net -685 ml    Additional Objective Labs: Basic Metabolic Panel: Recent Labs  Lab 06/28/22 0244 06/28/22 1330 06/29/22 1416 07/01/22 0554 07/02/22 0828  NA 142  --  137 134* 133*  K 4.3  --  4.9 5.1 4.7  CL 94*  --  92* 91* 96*  CO2 28  --  31 25  --   GLUCOSE 185*  --  83 269* 266*  BUN 31*  --  53* 51* 35*  CREATININE 4.68*  --  6.80* 6.19* 5.50*  CALCIUM 9.3  --  9.3 8.7*  --   PHOS  --  4.6  --  4.4  --     Liver Function Tests: Recent Labs  Lab 06/28/22 0244 06/29/22 1416 07/01/22 0554  AST 20 15  --   ALT 10 9  --   ALKPHOS 101 92  --   BILITOT 0.3 0.4  --   PROT 7.6 6.8  --   ALBUMIN 2.7* 2.4* 2.4*    CBC: Recent Labs  Lab 06/28/22 0244 06/29/22 1416 07/01/22 0554 07/02/22 0828  WBC 8.0 8.1 7.9  --   NEUTROABS 5.4  --   --   --   HGB 11.8* 11.1* 12.1 15.3*  HCT 38.9 35.1* 38.8 45.0  MCV 85.1 81.4 83.4  --   PLT 185 195 175  --     Blood Culture    Component Value Date/Time   SDES BLOOD RIGHT ARM 06/28/2022 0625   SPECREQUEST  06/28/2022 0625    BOTTLES DRAWN AEROBIC AND ANAEROBIC Blood  Culture adequate volume   CULT  06/28/2022 0625    NO GROWTH 4 DAYS Performed at [facility], [address] [place], [place] [address]    REPTSTATUS PENDING 06/28/2022 0625    CBG: Recent Labs  Lab 07/01/22 2203 07/02/22 0723 07/02/22 0800 07/02/22 1111 07/02/22 1219  GLUCAP 97 277* 274* 166* 112*     Medications:  sodium chloride     ceFEPime (MAXIPIME) IV 1 g (07/01/22 2209)   magnesium sulfate bolus IVPB     [START ON 07/03/2022] vancomycin      atorvastatin  40 mg Oral Daily   Chlorhexidine Gluconate Cloth  6 each Topical Q0600   cinacalcet  90 mg Oral Daily   [START ON 07/03/2022] docusate sodium  100 mg Oral Daily   dorzolamide-timolol  1 drop Left Eye BID   feeding supplement (NEPRO CARB STEADY)  237 mL Oral TID BM   fentaNYL       fentaNYL       fentaNYL       heparin  5,000 Units Subcutaneous Q8H   heparin  5,000 Units Subcutaneous Q8H   insulin aspart  0-5 Units Subcutaneous QHS   insulin aspart  0-6 Units Subcutaneous TID WC   leptospermum manuka honey  1 Application Topical Daily   midodrine  5 mg Oral TID WC   multivitamin with minerals  1 tablet Oral q morning   mupirocin ointment  1 Application Nasal BID   nystatin  5 mL Oral QID   pantoprazole  40 mg Oral Daily   sennosides  5 mL Per Tube BID   sodium chloride flush  3 mL Intravenous Q12H   thiamine  100 mg Per Tube Daily    Dialysis Orders: [facility] MWF   3h 61min  400/1.5   51kg  2/2 bath  Hep none RUA AVF - mircera 225 ug q2, last 11/10, due 11/24 - last HD 11/15 post wt 52.5kg     Na 142  K 4.3  CO2 28  BUN 31  Creat 4.6  alb 2.7  LFT's okay  Hb 11.8    WBC 8K   LA 3.5, 3.2, CRP 6.9    Assessment/ Plan: L foot acute osteomyelitis - w/ underlying PAD, started on IV abx, 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  Next HD 11/21 d/t holiday schedule.  Week of Thanksgiving Holiday MWF patients will run on an alternate schedule: Sunday, Tuesday, Friday.   BP- variable.  cont  midodrine for chronic hypotension Volume - Does not appear volume overloaded.  UF as tolerated.  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 not on vdra. Cont sensipar.  DM2 - per pmd 8. Nutrition - renal diet w/fluid restrictions  Rexene Agent, MD  [place] 07/02/2022,3:17 PM  LOS: 4 days

## 2022-07-03 ENCOUNTER — Encounter (HOSPITAL_COMMUNITY): Payer: Medicare Other

## 2022-07-03 ENCOUNTER — Ambulatory Visit: Payer: Medicare Other | Admitting: Vascular Surgery

## 2022-07-03 ENCOUNTER — Encounter (HOSPITAL_COMMUNITY): Payer: Self-pay | Admitting: Vascular Surgery

## 2022-07-03 DIAGNOSIS — Z992 Dependence on renal dialysis: Secondary | ICD-10-CM | POA: Diagnosis not present

## 2022-07-03 DIAGNOSIS — M86172 Other acute osteomyelitis, left ankle and foot: Secondary | ICD-10-CM | POA: Diagnosis not present

## 2022-07-03 DIAGNOSIS — N186 End stage renal disease: Secondary | ICD-10-CM | POA: Diagnosis not present

## 2022-07-03 DIAGNOSIS — I739 Peripheral vascular disease, unspecified: Secondary | ICD-10-CM | POA: Diagnosis not present

## 2022-07-03 LAB — BASIC METABOLIC PANEL
Anion gap: 17 — ABNORMAL HIGH (ref 5–15)
BUN: 37 mg/dL — ABNORMAL HIGH (ref 6–20)
CO2: 23 mmol/L (ref 22–32)
Calcium: 8.7 mg/dL — ABNORMAL LOW (ref 8.9–10.3)
Chloride: 96 mmol/L — ABNORMAL LOW (ref 98–111)
Creatinine, Ser: 5.66 mg/dL — ABNORMAL HIGH (ref 0.44–1.00)
GFR, Estimated: 9 mL/min — ABNORMAL LOW (ref >60)
Glucose, Bld: 89 mg/dL (ref 70–99)
Potassium: 5.3 mmol/L — ABNORMAL HIGH (ref 3.5–5.1)
Sodium: 136 mmol/L (ref 135–145)

## 2022-07-03 LAB — GLUCOSE, CAPILLARY
Glucose-Capillary: 84 mg/dL (ref 70–99)
Glucose-Capillary: 100 mg/dL — ABNORMAL HIGH (ref 70–99)
Glucose-Capillary: 93 mg/dL (ref 70–99)

## 2022-07-03 LAB — CBC
HCT: 37.3 % (ref 36.0–46.0)
Hemoglobin: 11.2 g/dL — ABNORMAL LOW (ref 12.0–15.0)
MCH: 25.6 pg — ABNORMAL LOW (ref 26.0–34.0)
MCHC: 30 g/dL (ref 30.0–36.0)
MCV: 85.2 fL (ref 80.0–100.0)
Platelets: 192 10*3/uL (ref 150–400)
RBC: 4.38 MIL/uL (ref 3.87–5.11)
RDW: 20.7 % — ABNORMAL HIGH (ref 11.5–15.5)
WBC: 9.8 10*3/uL (ref 4.0–10.5)
nRBC: 0.2 % (ref 0.0–0.2)

## 2022-07-03 LAB — CULTURE, BLOOD (ROUTINE X 2)
Culture: NO GROWTH
Culture: NO GROWTH
Special Requests: ADEQUATE

## 2022-07-03 MED ORDER — VANCOMYCIN HCL 500 MG/100ML IV SOLN: 500.0000 mg | INTRAVENOUS | Status: DC

## 2022-07-03 NOTE — Evaluation (Signed)
Physical Therapy Evaluation Patient Details Name: [name] MRN: [id number] DOB: [birth year] Today's Date: 07/03/2022  History of Present Illness  41 yo female admitted 11/15 from [facility] with left heel wound with osteomyelitis. 11/20 Lt AKA. PMhx: ESRD on HD MWF, T2DM, neuropathy, Rt eye blindness, Rt AKA, CVA, guillian barre, HLD, PTSD   Clinical Impression  Pt admitted with above diagnosis. PTA pt resided at [facility] where she has been since her R AKA in Feb 2023. Pt reports she was hoyer lift dependent with transfers, and able to self propel in wheelchair. Pt currently with functional limitations due to the deficits listed below (see PT Problem List). On eval, pt required mod assist rolling. Unable to tolerate further mobility due to pain. AROM L hip WFL. Pt will benefit from skilled PT to increase their independence and safety with mobility to allow discharge to the venue listed below.          Recommendations for follow up therapy are one component of a multi-disciplinary discharge planning process, led by the attending physician.  Recommendations may be updated based on patient status, additional functional criteria and insurance authorization.  Follow Up Recommendations Skilled nursing-short term rehab (<3 hours/day) Can patient physically be transported by private vehicle: No    Assistance Recommended at Discharge Frequent or constant Supervision/Assistance  Patient can return home with the following       Equipment Recommendations Other (comment) (defer to SNF)  Recommendations for Other Services       Functional Status Assessment Patient has had a recent decline in their functional status and demonstrates the ability to make significant improvements in function in a reasonable and predictable amount of time.     Precautions / Restrictions Precautions Precautions: Fall Restrictions Weight Bearing Restrictions: Yes LLE Weight Bearing: Non weight bearing       Mobility  Bed Mobility Overal bed mobility: Needs Assistance Bed Mobility: Rolling Rolling: Mod assist         General bed mobility comments: unable to attempt supine to sit due to pain. Assisted with repositioning in bed.    Transfers                        Ambulation/Gait                  Stairs            Wheelchair Mobility    Modified Rankin (Stroke Patients Only)       Balance                                             Pertinent Vitals/Pain Pain Assessment Pain Assessment: 0-10 Pain Score: 10-Worst pain ever Pain Location: L residual limb Pain Descriptors / Indicators: Crying, Moaning Pain Intervention(s): Limited activity within patient's tolerance, Patient requesting pain meds-RN notified    Home Living Family/patient expects to be discharged to:: Skilled nursing facility                   Additional Comments: Pt has resided at [facility] since R AKA in Feb 2023.    Prior Function Prior Level of Function : Needs assist       Physical Assist : Mobility (physical)     Mobility Comments: Per pt, hoyer lift transfers to/from wheelchair. Able to self propel. ADLs Comments: Does not  make urine (ESRD on HD). Uses brief/diaper for BM. SNF staff assists with ADLs     Hand Dominance   Dominant Hand: Left    Extremity/Trunk Assessment   Upper Extremity Assessment Upper Extremity Assessment: Overall WFL for tasks assessed    Lower Extremity Assessment Lower Extremity Assessment: RLE deficits/detail;LLE deficits/detail RLE Deficits / Details: h/o AKA (Feb 2023) LLE Deficits / Details: s/p L AKA 07/02/22, hip AROM WFL       Communication      Cognition Arousal/Alertness: Awake/alert Behavior During Therapy: WFL for tasks assessed/performed, Agitated Overall Cognitive Status: No family/caregiver present to determine baseline cognitive functioning                                 General  Comments: Easily agitated with history and orientation questions        General Comments      Exercises     Assessment/Plan    PT Assessment Patient needs continued PT services  PT Problem List Decreased strength;Decreased balance;Decreased mobility;Pain;Decreased activity tolerance       PT Treatment Interventions Functional mobility training;Therapeutic activities;Therapeutic exercise;Patient/family education;Balance training    PT Goals (Current goals can be found in the Care Plan section)  Acute Rehab PT Goals Patient Stated Goal: decrease pain PT Goal Formulation: With patient Time For Goal Achievement: 07/17/22 Potential to Achieve Goals: Good    Frequency Min 2X/week     Co-evaluation               AM-PAC PT "6 Clicks" Mobility  Outcome Measure Help needed turning from your back to your side while in a flat bed without using bedrails?: A Lot Help needed moving from lying on your back to sitting on the side of a flat bed without using bedrails?: Total Help needed moving to and from a bed to a chair (including a wheelchair)?: Total Help needed standing up from a chair using your arms (e.g., wheelchair or bedside chair)?: Total Help needed to walk in hospital room?: Total Help needed climbing 3-5 steps with a railing? : Total 6 Click Score: 7    End of Session   Activity Tolerance: Patient limited by pain Patient left: in bed;with call bell/phone within reach Nurse Communication: Patient requests pain meds PT Visit Diagnosis: Other abnormalities of gait and mobility (R26.89);Pain Pain - Right/Left: Left Pain - part of body: Leg    Time: 3845-3646 PT Time Calculation (min) (ACUTE ONLY): 12 min   Charges:   PT Evaluation $PT Eval Moderate Complexity: 1 Mod          [name], PT  Office # [phone] Pager [phone]   [name] 07/03/2022, 11:18 AM

## 2022-07-03 NOTE — Plan of Care (Signed)

## 2022-07-03 NOTE — Progress Notes (Signed)
   07/03/22 1920  Vitals  Temp 97.8 F (36.6 C)  Temp Source Oral  BP (!) 178/62  BP Location Left Arm  BP Method Automatic  Patient Position (if appropriate) Lying  Pulse Rate 91  Pulse Rate Source Monitor  ECG Heart Rate 91  Resp (!) 22  Post Treatment  Duration of HD Treatment -hour(s) 3.5 hour(s)  Liters Processed 73.5  Fluid Removed (mL) 2000 mL  Tolerated HD Treatment Yes  Post-Hemodialysis Comments Had po prn for pain nbecause pharm didn't tube inj. told recieving RN.  AVG/AVF Arterial Site Held (minutes) 5 minutes  AVG/AVF Venous Site Held (minutes) 5 minutes   TX fin. W/o difficulty.

## 2022-07-03 NOTE — Care Management Important Message (Signed)
Important Message  Patient Details  Name: [name] MRN: [id number] Date of Birth: [birth year]   Medicare Important Message Given:  Yes     Charnise Lovan 07/03/2022, 2:02 PM

## 2022-07-03 NOTE — Progress Notes (Signed)
  Progress Note    07/03/2022 6:46 AM 1 Day Post-Op  Subjective:  sleeping but awakes easily.  Says she is having some pain.   Afebrile  Vitals:   07/02/22 1915 07/03/22 0405  BP: (!) 117/96 (!) 150/65  Pulse: 79 85  Resp: 18 18  Temp: 97.9 F (36.6 C) 98.3 F (36.8 C)  SpO2: 98% 100%    Physical Exam: Incisions:  bandage in place and is clean and dry   CBC    Component Value Date/Time   WBC 9.8 07/03/2022 0456   RBC 4.38 07/03/2022 0456   HGB 11.2 (L) 07/03/2022 0456   HGB 10.6 (L) 05/18/2020 0927   HCT 37.3 07/03/2022 0456   HCT 35.1 05/18/2020 0927   PLT 192 07/03/2022 0456   PLT 186 05/18/2020 0927   MCV 85.2 07/03/2022 0456   MCV 89 05/18/2020 0927   MCH 25.6 (L) 07/03/2022 0456   MCHC 30.0 07/03/2022 0456   RDW 20.7 (H) 07/03/2022 0456   RDW 13.9 05/18/2020 0927   LYMPHSABS 1.7 06/28/2022 0244   LYMPHSABS 1.6 05/18/2020 0927   MONOABS 0.5 06/28/2022 0244   EOSABS 0.4 06/28/2022 0244   EOSABS 0.6 (H) 05/18/2020 0927   BASOSABS 0.1 06/28/2022 0244   BASOSABS 0.0 05/18/2020 0927    BMET    Component Value Date/Time   NA 136 07/03/2022 0456   NA 133 (L) 05/18/2020 0927   K 5.3 (H) 07/03/2022 0456   CL 96 (L) 07/03/2022 0456   CO2 23 07/03/2022 0456   GLUCOSE 89 07/03/2022 0456   BUN 37 (H) 07/03/2022 0456   BUN 45 (H) 05/18/2020 0927   CREATININE 5.66 (H) 07/03/2022 0456   CALCIUM 8.7 (L) 07/03/2022 0456   CALCIUM 12.1 (H) 11/13/2021 2026   GFRNONAA 9 (L) 07/03/2022 0456   GFRAA 6 (L) 05/18/2020 0927    INR    Component Value Date/Time   INR 1.3 (H) 06/28/2022 1330     Intake/Output Summary (Last 24 hours) at 07/03/2022 0646 Last data filed at 07/02/2022 1100 Gross per 24 hour  Intake 585 ml  Output 70 ml  Net 515 ml     Assessment/Plan:  41 y.o. female is s/p right above knee amputation   1 Day Post-Op   -pt bandage is clean and dry-will remove this tomorrow -pt having some pain.  She has received IV dialudid overnight and  one Norco tab this morning.  Okay to increase po pain medication to 1-2 every 4 hours if ok with primary team.    [name], PA-C [facility] [phone] 07/03/2022 6:46 AM

## 2022-07-03 NOTE — Progress Notes (Signed)
PROGRESS NOTE    [name]  VFI:[id number] DOB: 06-30-1981 DOA: 06/27/2022 PCP: [facility]    Chief Complaint  Patient presents with   Wound Check    Brief Narrative:   [name] is a 41 y.o. 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  She is also on chronic dialysis every Monday Wednesday Friday, she has peripheral vascular disease and anemia and chronic kidney disease as well as secondary hyperparathyroidism.  Patient has a known chronic left heel wound.  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  She has had a prior right AKA by Dr. Trula Slade in February 2023.  Patient was sent from the nursing facility because of increasing pain and drainage from the left heel wound.  She had been placed on doxycycline for the past 2 days without any improvement in symptoms.  Work-up significant for acute osteomyelitis, she was admitted for further work-up.  Patient went for left AKA by vascular surgery 07/02/2022.      Assessment & Plan:   Principal Problem:   Osteomyelitis of ankle or foot, acute, left (HCC) Active Problems:   PVD (peripheral vascular disease) (HCC)   Secondary hyperparathyroidism of renal origin ([place])   ESRD on hemodialysis ([place])   Type 2 diabetes mellitus with hyperglycemia, with long-term current use of insulin (HCC)   Chronic left heel wound now with acute osteomyelitis - Patient with worsening of chronic left heel wound now with evidence of acute osteomyelitis and drainage -She is with known severe peripheral vascular disease, vascular surgery consulted, went for AKA 11/20. -Continue with broad-spectrum IV antibiotic coverage for another 24 hours then DC. -Blood cultures remains negative to date. -Continue with pain meds as  needed  ESRD - renal consulted, she will  be dialyzed today, and next HD on Tuesday(holidaY schedule)  Diabetes mellitus on insulin -Was hyperglycemic on admission, but she was hypoglycemic thereafter, with poor appetite, so long-acting insulin has been discontinued, will keep on insulin sliding scale meanwhile. -Appetite is poor , will start on Nepro.  -Diabetes is brittle,  will hold on resuming Semglee.  Hypertension Controlled with volume management/hemodialysis   PVD history of prior right AKA Management per vascular surgery   History of CVA Continue secondary prevention with Lipitor     DVT prophylaxis: Heparin Code Status: (Full) Family Communication: none at bedside Disposition: back to SNF in 1-2 days when stable  Status is: Inpatient    Consultants:  Renal  Vascular  Subjective:  Significant events overnight, she denies any complaints today.  Objective: Vitals:   07/02/22 1915 07/03/22 0405 07/03/22 0500 07/03/22 0754  BP: (!) 117/96 (!) 150/65  (!) 153/97  Pulse: 79 85  88  Resp: 18 18  15   Temp: 97.9 F (36.6 C) 98.3 F (36.8 C)  99.1 F (37.3 C)  TempSrc:  Oral  Oral  SpO2: 98% 100%  100%  Weight:   54.7 kg   Height:        Intake/Output Summary (Last 24 hours) at 07/03/2022 0931 Last data filed at 07/02/2022 1100 Gross per 24 hour  Intake 585 ml  Output 70 ml  Net 515 ml    Filed Weights   06/29/22 1756 07/01/22 0500 07/03/22 0500  Weight: 52.3 kg 54.8 kg 54.7 kg    Examination:  Awake Alert, Oriented X 3, She is blind in the right eye Symmetrical Chest wall movement, Good air movement bilaterally, CTAB RRR,No Gallops,Rubs or new Murmurs, No Parasternal Heave +ve B.Sounds, Abd Soft, No tenderness, No rebound - guarding or rigidity. Old right AKA, left AKA, site bandaged.    Data Reviewed: I have personally reviewed following labs and imaging studies  CBC: Recent Labs  Lab 06/28/22 0244 06/29/22 1416 07/01/22 0554  07/02/22 0828 07/03/22 0456  WBC 8.0 8.1 7.9  --  9.8  NEUTROABS 5.4  --   --   --   --   HGB 11.8* 11.1* 12.1 15.3* 11.2*  HCT 38.9 35.1* 38.8 45.0 37.3  MCV 85.1 81.4 83.4  --  85.2  PLT 185 195 175  --  102    Basic Metabolic Panel: Recent Labs  Lab 06/28/22 0244 06/28/22 1330 06/29/22 1416 07/01/22 0554 07/02/22 0828 07/03/22 0456  NA 142  --  137 134* 133* 136  K 4.3  --  4.9 5.1 4.7 5.3*  CL 94*  --  92* 91* 96* 96*  CO2 28  --  31 25  --  23  GLUCOSE 185*  --  83 269* 266* 89  BUN 31*  --  53* 51* 35* 37*  CREATININE 4.68*  --  6.80* 6.19* 5.50* 5.66*  CALCIUM 9.3  --  9.3 8.7*  --  8.7*  MG  --  2.2  --   --   --   --   PHOS  --  4.6  --  4.4  --   --     GFR: Estimated Creatinine Clearance: 10 mL/min (A) (by C-G formula based on SCr of 5.66 mg/dL (H)).  Liver Function Tests: Recent Labs  Lab 06/28/22 0244 06/29/22 1416 07/01/22 0554  AST 20 15  --   ALT 10 9  --   ALKPHOS 101 92  --   BILITOT 0.3 0.4  --   PROT 7.6 6.8  --   ALBUMIN 2.7* 2.4* 2.4*    CBG: Recent Labs  Lab 07/02/22 1111 07/02/22 1219 07/02/22 1547 07/02/22 2124 07/03/22 0753  GLUCAP 166* 112* 88 95 84     Recent Results (from the past 240 hour(s))  Blood Cultures x 2 sites     Status: None   Collection Time: 06/27/22  2:44 AM   Specimen: BLOOD LEFT HAND  Result Value Ref Range Status   Specimen Description BLOOD LEFT HAND  Final   Special Requests   Final    BOTTLES DRAWN AEROBIC AND ANAEROBIC Blood Culture results may not be optimal due to an inadequate volume of blood received in culture bottles   Culture   Final    NO GROWTH 5 DAYS Performed at [facility], [place] [address], [place], [place] [address]    Report Status 07/03/2022 FINAL  Final  Blood Cultures x 2 sites     Status: None   Collection Time: 06/28/22  6:25 AM   Specimen: BLOOD RIGHT ARM  Result Value Ref Range Status   Specimen Description BLOOD RIGHT ARM  Final   Special Requests   Final     BOTTLES DRAWN AEROBIC AND ANAEROBIC Blood Culture adequate volume   Culture   Final    NO GROWTH 5 DAYS Performed at [facility], [place] [address], [place], [place] [address]    Report Status 07/03/2022 FINAL  Final  Surgical PCR screen     Status: None  Collection Time: 07/02/22  5:30 AM   Specimen: Nasal Mucosa; Nasal Swab  Result Value Ref Range Status   MRSA, PCR NEGATIVE NEGATIVE Final   Staphylococcus aureus NEGATIVE NEGATIVE Final    Comment: (NOTE) The Xpert SA Assay (FDA approved for NASAL specimens in patients 80 years of age and older), is one component of a comprehensive surveillance program. It is not intended to diagnose infection nor to guide or monitor treatment. Performed at [facility], [place] [address] [place], [place] [address]          Radiology Studies: No results found.      Scheduled Meds:  atorvastatin  40 mg Oral Daily   Chlorhexidine Gluconate Cloth  6 each Topical Q0600   cinacalcet  90 mg Oral Daily   docusate sodium  100 mg Oral Daily   dorzolamide-timolol  1 drop Left Eye BID   feeding supplement (NEPRO CARB STEADY)  237 mL Oral TID BM   heparin  5,000 Units Subcutaneous Q8H   heparin  5,000 Units Subcutaneous Q8H   insulin aspart  0-5 Units Subcutaneous QHS   insulin aspart  0-6 Units Subcutaneous TID WC   leptospermum manuka honey  1 Application Topical Daily   midodrine  5 mg Oral TID WC   multivitamin with minerals  1 tablet Oral q morning   mupirocin ointment  1 Application Nasal BID   nystatin  5 mL Oral QID   pantoprazole  40 mg Oral Daily   sennosides  5 mL Per Tube BID   sodium chloride flush  3 mL Intravenous Q12H   thiamine  100 mg Per Tube Daily   Continuous Infusions:  sodium chloride     ceFEPime (MAXIPIME) IV Stopped (07/02/22 2135)   magnesium sulfate bolus IVPB     vancomycin       LOS: 5 days      Phillips Climes, MD [facility]   To contact the attending provider between 7A-7P  or the covering provider during after hours 7P-7A, please log into the web site [email or web address] and access using universal [place] password for that web site. If you do not have the password, please call the hospital operator.  07/03/2022, 9:31 AM

## 2022-07-03 NOTE — Op Note (Signed)
    NAME: [name]    MRN: [id number] DOB: Nov 01, 1980    DATE OF OPERATION: 07/03/2022  PREOP DIAGNOSIS:    Osteomyelitis left heel  POSTOP DIAGNOSIS:    Same  PROCEDURE:    Left above-the-knee amputation  SURGEON: Judeth Cornfield. Scot Dock, MD  ASSIST: Olin Pia, RNFA  ANESTHESIA: General  EBL: Minimal  INDICATIONS:    [name] is a 41 y.o. female who has a right AKA.  She presented with osteomyelitis of the left heel and left above-the-knee amputation was recommended.  FINDINGS:   Excellent perfusion in the proximal thigh.  TECHNIQUE:   The patient was taken to the operating room and received a general anesthetic.  The left leg was prepped and draped in the usual sterile fashion.  A fishmouth incision was marked above the level of the patella.  Tourniquet was placed on the upper thigh.  The leg was exsanguinated with an Esmarch bandage and the tourniquet inflated to 300 mmHg.  Under tourniquet control the incision was carried down to the skin and subcutaneous tissue.  Because of her calcific disease the tourniquet was not fully effective.  Therefore I divided the muscle using electrocautery.  The dissection was carried down to the femur which was dissected free circumferentially.  The femoral artery and vein were individually suture-ligated with 2-0 silk ties.  Periosteum was elevated and the bone divided proximal to the level of skin division.  The tourniquet was released.  Additional hemostasis was obtained using electrocautery and 2-0 silk ties.  The wound was irrigated after the edges of the bone were rasped.  A fascial layer was closed over the femur with a 3-0 Vicryl.  The fascial layer was then closed with interrupted 2-0 Vicryl.  The skin was closed with staples.  Sterile dressing was applied.  The patient tolerated the procedure well and was transferred to the recovery room in stable condition.  All needle and sponge counts were correct.   Deitra Mayo, MD, FACS [facility]  DATE OF DICTATION:   07/03/2022

## 2022-07-03 NOTE — Progress Notes (Signed)
[facility] Progress Note   Subjective:    Having pain For HD today K 5.3  Objective Vitals:   07/02/22 1915 07/03/22 0405 07/03/22 0500 07/03/22 0754  BP: (!) 117/96 (!) 150/65  (!) 153/97  Pulse: 79 85  88  Resp: 18 18  15   Temp: 97.9 F (36.6 C) 98.3 F (36.8 C)  99.1 F (37.3 C)  TempSrc:  Oral  Oral  SpO2: 98% 100%  100%  Weight:   54.7 kg   Height:       Physical Exam General:Chronically ill appearing female, appears in pain Heart:RRR, no mrg Lungs:CTAB, nml WOB on RA Abdomen:soft, NTND Extremities:no LE edema, R BKA, L AKA stump with dressing, clean appearing Dialysis Access: RU AVF +b/t   Filed Weights   06/29/22 1756 07/01/22 0500 07/03/22 0500  Weight: 52.3 kg 54.8 kg 54.7 kg    Intake/Output Summary (Last 24 hours) at 07/03/2022 0932 Last data filed at 07/02/2022 1100 Gross per 24 hour  Intake 585 ml  Output 70 ml  Net 515 ml     Additional Objective Labs: Basic Metabolic Panel: Recent Labs  Lab 06/28/22 1330 06/29/22 1416 07/01/22 0554 07/02/22 0828 07/03/22 0456  NA  --  137 134* 133* 136  K  --  4.9 5.1 4.7 5.3*  CL  --  92* 91* 96* 96*  CO2  --  31 25  --  23  GLUCOSE  --  83 269* 266* 89  BUN  --  53* 51* 35* 37*  CREATININE  --  6.80* 6.19* 5.50* 5.66*  CALCIUM  --  9.3 8.7*  --  8.7*  PHOS 4.6  --  4.4  --   --     Liver Function Tests: Recent Labs  Lab 06/28/22 0244 06/29/22 1416 07/01/22 0554  AST 20 15  --   ALT 10 9  --   ALKPHOS 101 92  --   BILITOT 0.3 0.4  --   PROT 7.6 6.8  --   ALBUMIN 2.7* 2.4* 2.4*    CBC: Recent Labs  Lab 06/28/22 0244 06/29/22 1416 07/01/22 0554 07/02/22 0828 07/03/22 0456  WBC 8.0 8.1 7.9  --  9.8  NEUTROABS 5.4  --   --   --   --   HGB 11.8* 11.1* 12.1 15.3* 11.2*  HCT 38.9 35.1* 38.8 45.0 37.3  MCV 85.1 81.4 83.4  --  85.2  PLT 185 195 175  --  192    Blood Culture    Component Value Date/Time   SDES BLOOD RIGHT ARM 06/28/2022 0625   SPECREQUEST   06/28/2022 0625    BOTTLES DRAWN AEROBIC AND ANAEROBIC Blood Culture adequate volume   CULT  06/28/2022 0625    NO GROWTH 5 DAYS Performed at [facility], [address] [place], [place] [address]    REPTSTATUS 07/03/2022 FINAL 06/28/2022 0625    CBG: Recent Labs  Lab 07/02/22 1111 07/02/22 1219 07/02/22 1547 07/02/22 2124 07/03/22 0753  GLUCAP 166* 112* 88 95 84     Medications:  sodium chloride     ceFEPime (MAXIPIME) IV Stopped (07/02/22 2135)   magnesium sulfate bolus IVPB     vancomycin      atorvastatin  40 mg Oral Daily   Chlorhexidine Gluconate Cloth  6 each Topical Q0600   cinacalcet  90 mg Oral Daily   docusate sodium  100 mg Oral Daily   dorzolamide-timolol  1 drop Left Eye BID  feeding supplement (NEPRO CARB STEADY)  237 mL Oral TID BM   heparin  5,000 Units Subcutaneous Q8H   heparin  5,000 Units Subcutaneous Q8H   insulin aspart  0-5 Units Subcutaneous QHS   insulin aspart  0-6 Units Subcutaneous TID WC   leptospermum manuka honey  1 Application Topical Daily   midodrine  5 mg Oral TID WC   multivitamin with minerals  1 tablet Oral q morning   mupirocin ointment  1 Application Nasal BID   nystatin  5 mL Oral QID   pantoprazole  40 mg Oral Daily   sennosides  5 mL Per Tube BID   sodium chloride flush  3 mL Intravenous Q12H   thiamine  100 mg Per Tube Daily    Dialysis Orders: [facility] MWF   3h 62min  400/1.5   51kg  2/2 bath  Hep none RUA AVF - mircera 225 ug q2, last 11/10, due 11/24 - last HD 11/15 post wt 52.5kg     Na 142  K 4.3  CO2 28  BUN 31  Creat 4.6  alb 2.7  LFT's okay  Hb 11.8    WBC 8K   LA 3.5, 3.2, CRP 6.9    Assessment/ Plan: L foot acute osteomyelitis - w/ underlying PAD, started on IV abx, s/p L AKA 07/02/22 with [facility]VS ESRD - on HD MWF.  Next HD 11/21 d/t holiday schedule.  Week of Thanksgiving Holiday MWF patients will run on an alternate schedule: Sunday, Tuesday, Friday.   BP- variable.  cont midodrine for chronic  hypotension Volume - Does not appear volume overloaded.  UF as tolerated.  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 not on vdra. Cont sensipar.  DM2 - per pmd 8. Nutrition - renal diet w/fluid restrictions  Rexene Agent, MD  [facility] 07/03/2022,9:32 AM  LOS: 5 days

## 2022-07-03 NOTE — Evaluation (Signed)
Occupational Therapy Evaluation Patient Details Name: [name] MRN: [id number] DOB: 1981-07-01 Today's Date: 07/03/2022   History of Present Illness 41 yo female admitted 11/15 from [facility] with left heel wound with osteomyelitis. 11/20 Lt AKA. PMhx: ESRD on HD MWF, T2DM, neuropathy, Rt eye blindness, Rt AKA, CVA, guillian barre, HLD, PTSD   Clinical Impression   [name] was evaluated s/p the above admission list, she is from SNF and uses hoyer at baseline to transfer to [facility], and staff assists with all ADLs. She also has low vision at baseline. Pt gave limited PLOF information. Upon evaluation pt was limited to rolling with max A to reposition in bed. Provided education and encouragement to mobilize further, pt continued to decline. OT to continue to follow acutely on a trial basis in attempt to increase indep in ADLs. Recommend d/c back to SNF.      Recommendations for follow up therapy are one component of a multi-disciplinary discharge planning process, led by the attending physician.  Recommendations may be updated based on patient status, additional functional criteria and insurance authorization.   Follow Up Recommendations  Skilled nursing-short term rehab (<3 hours/day)     Assistance Recommended at Discharge Frequent or constant Supervision/Assistance  Patient can return home with the following A lot of help with walking and/or transfers;A lot of help with bathing/dressing/bathroom;Assistance with cooking/housework;Assistance with feeding;Direct supervision/assist for medications management;Direct supervision/assist for financial management;Assist for transportation;Help with stairs or ramp for entrance    Functional Status Assessment  Patient has had a recent decline in their functional status and demonstrates the ability to make significant improvements in function in a reasonable and predictable amount of time.  Equipment Recommendations  None recommended by OT        Precautions / Restrictions Precautions Precautions: Fall Restrictions Weight Bearing Restrictions: Yes LLE Weight Bearing: Non weight bearing      Mobility Bed Mobility Overal bed mobility: Needs Assistance Bed Mobility: Rolling Rolling: Max assist         General bed mobility comments: minimally assisting with BUEs on railing    Transfers   Equipment used: None                          ADL either performed or assessed with clinical judgement   ADL Overall ADL's : Needs assistance/impaired                                       General ADL Comments: total A for all tasks at bed level. Pt able to assist with UB tasks, feeding and grooming while supported in bed. However, she refused this date due to LLE pain.     Vision Baseline Vision/History: 2 Legally blind Ability to See in Adequate Light: 2 Moderately impaired Patient Visual Report: No change from baseline Vision Assessment?: Vision impaired- to be further tested in functional context Additional Comments: no change from baseline, reports blind in R eye. Able to see some objects out of L eye, unable to read any signs in the room from her bed     Perception Perception Perception Tested?: No   Praxis Praxis Praxis tested?: Not tested    Pertinent Vitals/Pain Pain Assessment Pain Assessment: Faces Faces Pain Scale: Hurts whole lot Pain Location: L residual limb Pain Descriptors / Indicators: Crying, Moaning Pain Intervention(s): Limited activity within patient's tolerance, Monitored during session  Hand Dominance Left   Extremity/Trunk Assessment Upper Extremity Assessment Upper Extremity Assessment: Generalized weakness   Lower Extremity Assessment Lower Extremity Assessment: Defer to PT evaluation RLE Deficits / Details: h/o AKA (Feb 2023) LLE Deficits / Details: s/p L AKA 07/02/22, hip AROM WFL       Communication Communication Communication: HOH   Cognition  Arousal/Alertness: Awake/alert Behavior During Therapy: WFL for tasks assessed/performed, Agitated Overall Cognitive Status: No family/caregiver present to determine baseline cognitive functioning                                 General Comments: easily agitated with questions and encouragement for movement. Asked pt what her goals are and she said "I don't know." and continued to repeat phrase for all further questions     General Comments  VSS. limited evaluation.     Home Living Family/patient expects to be discharged to:: Skilled nursing facility                                 Additional Comments: Pt has resided at [facility] since R AKA in Feb 2023.      Prior Functioning/Environment Prior Level of Function : Needs assist       Physical Assist : ADLs (physical);Mobility (physical)     Mobility Comments: Per pt, hoyer lift transfers to/from wheelchair. Able to self propel. ADLs Comments: Does not make urine (ESRD on HD). Uses brief/diaper for BM. SNF staff assists with all ADLs        OT Problem List: Decreased range of motion;Decreased activity tolerance;Impaired balance (sitting and/or standing);Decreased cognition;Decreased safety awareness;Decreased knowledge of use of DME or AE;Decreased knowledge of precautions;Pain      OT Treatment/Interventions: Self-care/ADL training;Therapeutic exercise;DME and/or AE instruction;Therapeutic activities;Balance training;Patient/family education    OT Goals(Current goals can be found in the care plan section) Acute Rehab OT Goals Patient Stated Goal: "I don't know." OT Goal Formulation: With patient Time For Goal Achievement: 07/17/22 Potential to Achieve Goals: Fair ADL Goals Pt Will Perform Grooming: with set-up;sitting Pt Will Perform Upper Body Dressing: with set-up;sitting Pt Will Perform Lower Body Dressing: with mod assist;sitting/lateral leans Additional ADL Goal #1: Pt will complete bed mobility  with min A as a precursor to ADLs  OT Frequency: Min 2X/week       AM-PAC OT "6 Clicks" Daily Activity     Outcome Measure Help from another person eating meals?: A Lot Help from another person taking care of personal grooming?: A Lot Help from another person toileting, which includes using toliet, bedpan, or urinal?: Total Help from another person bathing (including washing, rinsing, drying)?: Total Help from another person to put on and taking off regular upper body clothing?: Total Help from another person to put on and taking off regular lower body clothing?: Total 6 Click Score: 8   End of Session Nurse Communication: Mobility status  Activity Tolerance: Patient limited by pain Patient left: in bed;with call bell/phone within reach;with bed alarm set  OT Visit Diagnosis: Pain;Other abnormalities of gait and mobility (R26.89);Muscle weakness (generalized) (M62.81)                Time: 6010-9323 OT Time Calculation (min): 12 min Charges:  OT General Charges $OT Visit: 1 Visit OT Evaluation $OT Eval Moderate Complexity: 1 Mod   Kymora Sciara D Causey 07/03/2022, 11:49 AM

## 2022-07-03 NOTE — OR Nursing (Signed)
Addendum created on 07/03/22 to accurately reflect procedure

## 2022-07-04 ENCOUNTER — Telehealth: Payer: Self-pay | Admitting: Physician Assistant

## 2022-07-04 DIAGNOSIS — M86172 Other acute osteomyelitis, left ankle and foot: Secondary | ICD-10-CM | POA: Diagnosis not present

## 2022-07-04 LAB — CBC
HCT: 40.3 % (ref 36.0–46.0)
Hemoglobin: 12.5 g/dL (ref 12.0–15.0)
MCH: 26.2 pg (ref 26.0–34.0)
MCHC: 31 g/dL (ref 30.0–36.0)
MCV: 84.3 fL (ref 80.0–100.0)
Platelets: 210 10*3/uL (ref 150–400)
RBC: 4.78 MIL/uL (ref 3.87–5.11)
RDW: 20.8 % — ABNORMAL HIGH (ref 11.5–15.5)
WBC: 8.5 10*3/uL (ref 4.0–10.5)
nRBC: 0.2 % (ref 0.0–0.2)

## 2022-07-04 LAB — BASIC METABOLIC PANEL
Anion gap: 19 — ABNORMAL HIGH (ref 5–15)
BUN: 22 mg/dL — ABNORMAL HIGH (ref 6–20)
CO2: 24 mmol/L (ref 22–32)
Calcium: 8.8 mg/dL — ABNORMAL LOW (ref 8.9–10.3)
Chloride: 89 mmol/L — ABNORMAL LOW (ref 98–111)
Creatinine, Ser: 4.18 mg/dL — ABNORMAL HIGH (ref 0.44–1.00)
GFR, Estimated: 13 mL/min — ABNORMAL LOW (ref >60)
Glucose, Bld: 87 mg/dL (ref 70–99)
Potassium: 4.3 mmol/L (ref 3.5–5.1)
Sodium: 132 mmol/L — ABNORMAL LOW (ref 135–145)

## 2022-07-04 LAB — SURGICAL PATHOLOGY

## 2022-07-04 LAB — GLUCOSE, CAPILLARY
Glucose-Capillary: 110 mg/dL — ABNORMAL HIGH (ref 70–99)
Glucose-Capillary: 109 mg/dL — ABNORMAL HIGH (ref 70–99)

## 2022-07-04 MED ORDER — OXYCODONE-ACETAMINOPHEN 5-325 MG PO TABS: 1.0000 | ORAL_TABLET | Freq: Four times a day (QID) | ORAL | 0 refills | Status: AC | PRN

## 2022-07-04 NOTE — Progress Notes (Signed)
Pt  right upper arm noted to be swollen no previous documentation of same no complaints of pain to same radial pulse is 1+ bilaterally, sensation present and no numbness. same elevated will continue with monitior Doc on duty informed no new orders.

## 2022-07-04 NOTE — Progress Notes (Signed)
[facility] Progress Note   Subjective:    HD yesterday 2L UF Pt comfortable this AM  Objective Vitals:   07/03/22 2006 07/04/22 0356 07/04/22 0500 07/04/22 0800  BP: (!) 144/82 (!) 158/70  (!) 171/81  Pulse: 91 86  87  Resp: 17 18  20   Temp: 98.2 F (36.8 C) 98.3 F (36.8 C)  98.5 F (36.9 C)  TempSrc: Oral Oral  Oral  SpO2: 100% 98%  100%  Weight:   48.1 kg   Height:       Physical Exam General:Chronically ill appearing female, resting comfortably Heart:RRR, no mrg Lungs:CTAB, nml WOB on RA Abdomen:soft, NTND Extremities:no LE edema, R BKA, L AKA stump with dressing, clean appearing Dialysis Access: RU AVF +b/t   Filed Weights   07/03/22 1518 07/03/22 1920 07/04/22 0500  Weight: 49.5 kg 47.5 kg 48.1 kg    Intake/Output Summary (Last 24 hours) at 07/04/2022 0930 Last data filed at 07/04/2022 1610 Gross per 24 hour  Intake 303.27 ml  Output 2000 ml  Net -1696.73 ml     Additional Objective Labs: Basic Metabolic Panel: Recent Labs  Lab 06/28/22 1330 06/29/22 1416 07/01/22 0554 07/02/22 0828 07/03/22 0456 07/04/22 0733  NA  --    < > 134* 133* 136 132*  K  --    < > 5.1 4.7 5.3* 4.3  CL  --    < > 91* 96* 96* 89*  CO2  --    < > 25  --  23 24  GLUCOSE  --    < > 269* 266* 89 87  BUN  --    < > 51* 35* 37* 22*  CREATININE  --    < > 6.19* 5.50* 5.66* 4.18*  CALCIUM  --    < > 8.7*  --  8.7* 8.8*  PHOS 4.6  --  4.4  --   --   --    < > = values in this interval not displayed.    Liver Function Tests: Recent Labs  Lab 06/28/22 0244 06/29/22 1416 07/01/22 0554  AST 20 15  --   ALT 10 9  --   ALKPHOS 101 92  --   BILITOT 0.3 0.4  --   PROT 7.6 6.8  --   ALBUMIN 2.7* 2.4* 2.4*    CBC: Recent Labs  Lab 06/28/22 0244 06/29/22 1416 07/01/22 0554 07/02/22 0828 07/03/22 0456 07/04/22 0733  WBC 8.0 8.1 7.9  --  9.8 8.5  NEUTROABS 5.4  --   --   --   --   --   HGB 11.8* 11.1* 12.1 15.3* 11.2* 12.5  HCT 38.9 35.1* 38.8 45.0  37.3 40.3  MCV 85.1 81.4 83.4  --  85.2 84.3  PLT 185 195 175  --  192 210    Blood Culture    Component Value Date/Time   SDES BLOOD RIGHT ARM 06/28/2022 0625   SPECREQUEST  06/28/2022 0625    BOTTLES DRAWN AEROBIC AND ANAEROBIC Blood Culture adequate volume   CULT  06/28/2022 0625    NO GROWTH 5 DAYS Performed at [facility], [place] [address] [place], [place] [address]    REPTSTATUS 07/03/2022 FINAL 06/28/2022 0625    CBG: Recent Labs  Lab 07/02/22 2124 07/03/22 0753 07/03/22 1131 07/03/22 2115 07/04/22 0811  GLUCAP 95 84 100* 93 110*     Medications:  sodium chloride     ceFEPime (MAXIPIME) IV Stopped (07/03/22 2046)  magnesium sulfate bolus IVPB      atorvastatin  40 mg Oral Daily   Chlorhexidine Gluconate Cloth  6 each Topical Q0600   cinacalcet  90 mg Oral Daily   docusate sodium  100 mg Oral Daily   dorzolamide-timolol  1 drop Left Eye BID   feeding supplement (NEPRO CARB STEADY)  237 mL Oral TID BM   heparin  5,000 Units Subcutaneous Q8H   insulin aspart  0-5 Units Subcutaneous QHS   insulin aspart  0-6 Units Subcutaneous TID WC   leptospermum manuka honey  1 Application Topical Daily   midodrine  5 mg Oral TID WC   multivitamin with minerals  1 tablet Oral q morning   mupirocin ointment  1 Application Nasal BID   nystatin  5 mL Oral QID   pantoprazole  40 mg Oral Daily   sennosides  5 mL Per Tube BID   sodium chloride flush  3 mL Intravenous Q12H   thiamine  100 mg Per Tube Daily    Dialysis Orders: [facility] MWF   3h 38min  400/1.5   51kg  2/2 bath  Hep none RUA AVF - mircera 225 ug q2, last 11/10, due 11/24 - last HD 11/15 post wt 52.5kg     Na 142  K 4.3  CO2 28  BUN 31  Creat 4.6  alb 2.7  LFT's okay  Hb 11.8    WBC 8K   LA 3.5, 3.2, CRP 6.9    Assessment/ Plan: L foot acute osteomyelitis - w/ underlying PAD, started on IV abx, s/p L AKA 07/02/22 with VVS ESRD - on HD MWF.  Next HD 11/24 d/t holiday schedule.  Week of Thanksgiving  Holiday MWF patients will run on an alternate schedule: Sunday, Tuesday, Friday.   BP- variable.  cont midodrine for chronic hypotension Volume - Does not appear volume overloaded.  UF as tolerated. 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 not on vdra. Cont sensipar.  DM2 - per pmd 8. Nutrition - renal diet w/fluid restrictions  Rexene Agent, MD  [facility] 07/04/2022,9:30 AM  LOS: 6 days

## 2022-07-04 NOTE — Telephone Encounter (Signed)
-----   Message from Gabriel Earing, [place] sent at 07/04/2022  7:28 AM EST ----- S/p left AKA 11/20.  Please have pt f/u in 4-5 weeks on Dr. Scot Dock clinic day.  Thanks
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
## 2022-07-04 NOTE — TOC Transition Note (Signed)
Transition of Care [facility]) - CM/SW Discharge Note   Patient Details  Name: [name] MRN: [id number] Date of Birth: 10/22/1980  [facility] CM/SW Contact:  Curlene Labrum, RN Phone Number: 07/04/2022, 11:09 AM   Clinical Narrative:    CM called and spoke with Raquel Sarna, Admission CM at [facility]) and the facility is holding a bed for patient to return to the facility today.  Patient is medically stable to return to the facility today via [place].  FL2 completed.  Discharge orders and summary are being completed by attending MD.  Corey Harold transportation will be coordinated for return to the facility.  Bedside nursing - please call report to [facility] at [phone].   Final next level of care: [place] Barriers to Discharge: No Barriers Identified   Patient Goals and CMS Choice Patient states their goals for this hospitalization and ongoing recovery are:: to return to [facility] CMS Medicare.gov Compare Post Acute Care list provided to:: Patient Choice offered to / list presented to : Patient  Discharge Placement                       Discharge Plan and Services     Post Acute Care Choice: Resumption of Svcs/PTA Provider, [place]                               Social Determinants of Health (SDOH) Interventions     Readmission Risk Interventions    07/04/2022   10:49 AM 08/23/2021    3:42 PM  Readmission Risk Prevention Plan  Transportation Screening Complete Complete  HRI or Home Care Consult  Complete  Social Work Consult for [place] Planning/Counseling  Complete  Palliative Care Screening  Not Applicable  Medication Review Press photographer) Complete Complete  PCP or Specialist appointment within 3-5 days of discharge Complete   HRI or [place] Complete   SW Recovery Care/Counseling Consult Complete   Palliative Care Screening Not [address] [place] Complete

## 2022-07-04 NOTE — Discharge Summary (Signed)
Physician Discharge Summary  [name] TIR:[id number] DOB: [birth year] DOA: 06/27/2022  PCP: [facility]  Admit date: 06/27/2022 Discharge date: 07/04/2022  Admitted From: Long-term care Disposition: Long-term care nursing home  Recommendations for Outpatient Follow-up:  Routine dialysis as scheduled Vascular surgery will follow-up  Home Health: N/A Equipment/Devices: N/A  Discharge Condition: Stable CODE STATUS: Full code Diet recommendation: Low-salt, low-carb diet  Discharge summary: 41 year old female with history of type 2 diabetes, ESRD on hemodialysis MWF, severe peripheral vascular disease and right above-knee amputation was admitted with nonhealing left heel wound, not responded to conservative management needing amputation.  She was treated with antibiotics.  Underwent left above-knee amputation on 11/20.  Surgically stable as per surgery.  Going back to nursing home today on oral pain medications.  Nonhealing chronic left heel wound with acute osteomyelitis: Severe peripheral vascular disease.  Was on IV antibiotics.  Blood cultures negative.  Underwent above-knee amputation.  Discontinue all antibiotics. Postop management as per surgery.  Nonweightbearing. Dry dressing and compression bandages. Suture removal after follow-up with vascular surgery.  ESRD on hemodialysis: Patient receiving already scheduled hemodialysis.  Next dialysis Friday 11/24.  Type 2 diabetes on insulin: She had episode of hypoglycemia perioperative.  Now her oral intake is improving.  She will continue twice daily long-acting insulin on discharge.  No benefit or may even have complication with metformin.  We will discontinue.  Essential hypertension: Blood pressure stable.  Medically stable to transfer to long-term care.      Discharge Diagnoses:  Principal Problem:   Osteomyelitis of ankle or foot, acute, left (HCC) Active Problems:   PVD (peripheral  vascular disease) ([place])   Secondary hyperparathyroidism of renal origin ([place])   ESRD on hemodialysis ([place])   Type 2 diabetes mellitus with hyperglycemia, with long-term current use of insulin [facility])    Discharge Instructions  Discharge Instructions     Diet - low sodium heart healthy   Complete by: As directed    Discharge wound care:   Complete by: As directed    Dry dressing and compression bandages   Increase activity slowly   Complete by: As directed       Allergies as of 07/04/2022       Reactions   Morphine Rash, Other (See Comments)   Peanut-containing Drug Products Anaphylaxis, Hives   Penicillins Rash, Other (See Comments)   Rash in 2008.  Tolerated cefazolin in 2020   Chocolate Hives        Medication List     STOP taking these medications    doxycycline 100 MG tablet Commonly known as: ADOXA   feeding supplement (NEPRO CARB STEADY) Liqd   feeding supplement (PROSource TF) liquid   free water Soln   metFORMIN 500 MG tablet Commonly known as: GLUCOPHAGE   Nepro Liqd   nutrition supplement (JUVEN) Pack   oxyCODONE 5 MG immediate release tablet Commonly known as: Oxy IR/ROXICODONE   Retacrit 10000 UNIT/ML injection Generic drug: epoetin alfa-epbx   Santyl 250 UNIT/GM ointment Generic drug: collagenase   sennosides 8.8 MG/5ML syrup Commonly known as: SENOKOT       TAKE these medications    Accu-Chek FastClix Lancets Misc Use as instructed to check blood sugar up to TID. E11.22 What changed:  how much to take how to take this when to take this   Accu-Chek Guide Me w/Device Kit 1 kit by Does not apply route 3 (three) times daily. Use to check BG at home up to  3 times daily. E11.22   Accu-Chek Guide test strip Generic drug: glucose blood Use as instructed to check blood sugar up to TID. E11.22 What changed:  how much to take how to take this when to take this   atorvastatin 40 MG tablet Commonly known as: LIPITOR Place 1  tablet (40 mg total) into feeding tube daily.   cinacalcet 90 MG tablet Commonly known as: SENSIPAR Take 90 mg by mouth daily.   Dexcom G6 Transmitter Misc 1 Device by Does not apply route as directed.   dorzolamide-timolol 2-0.5 % ophthalmic solution Commonly known as: COSOPT Place 1 drop into the left eye 2 (two) times daily.   Glucagon Emergency 1 MG Kit Inject 1 mg into the muscle once. If BS drops below 60   insulin aspart 100 UNIT/ML injection Commonly known as: novoLOG Inject 0-6 Units into the skin every 4 (four) hours. What changed:  how much to take when to take this additional instructions   insulin glargine-yfgn 100 UNIT/ML Pen Commonly known as: SEMGLEE Inject 5 Units into the skin 2 (two) times daily.   Insulin Pen Needle 32G X 4 MM Misc 1 Device by Does not apply route as directed.   midodrine 5 MG tablet Commonly known as: PROAMATINE Take 5 mg by mouth 3 (three) times daily.   multivitamin with minerals Tabs tablet Take 1 tablet by mouth daily.   NON FORMULARY Place 30 mLs into feeding tube daily. ProHeal   nystatin 100000 UNIT/ML suspension Commonly known as: MYCOSTATIN Take 5 mLs (500,000 Units total) by mouth 4 (four) times daily.   ondansetron 4 MG tablet Commonly known as: ZOFRAN Take 4 mg by mouth every 6 (six) hours as needed for nausea/vomiting.   oxyCODONE-acetaminophen 5-325 MG tablet Commonly known as: Percocet Take 1 tablet by mouth every 6 (six) hours as needed for up to 5 days for severe pain.   sodium chloride flush 0.9 % Soln Commonly known as: NS Inject 10 mLs into the vein at bedtime.   thiamine 100 MG tablet Commonly known as: VITAMIN B1 Place 1 tablet (100 mg total) into feeding tube daily.               Discharge Care Instructions  (From admission, onward)           Start     Ordered   07/04/22 0000  Discharge wound care:       Comments: Dry dressing and compression bandages   07/04/22 1034             Follow-up Information     [facility] Follow up in 4 week(s).   Specialty: Vascular Surgery Why: Office will call you to arrange your appt (sent). Contact information: [address] [place] [address] [phone]               Allergies  Allergen Reactions   Morphine Rash and Other (See Comments)   Peanut-Containing Drug Products Anaphylaxis and Hives   Penicillins Rash and Other (See Comments)    Rash in 2008.  Tolerated cefazolin in 2020   Chocolate Hives    Consultations: Vascular Nephrology   Procedures/Studies: MRI Left foot without contrast  Result Date: 06/27/2022 CLINICAL DATA:  Left heel wound EXAM: MRI OF THE LEFT FOOT WITHOUT CONTRAST TECHNIQUE: Multiplanar, multisequence MR imaging of the left hindfoot was performed. No intravenous contrast was administered. COMPARISON:  X-ray 06/27/2022 FINDINGS: Bones/Joint/Cartilage Extensive findings of acute osteomyelitis involving the calcaneus with  erosion of the posterior cortex and extensive bone marrow edema and confluent low T1 signal changes. Abnormal marrow signal extends into the subchondral bone along the anterior margin of the posterior subtalar joint and closely approximates the calcaneocuboid joint (series 8, image 12). There is presumed packing material at the posterior aspect of the calcaneus. Trace tibiotalar and posterior subtalar joint effusions, favored reactive. No additional sites of acute osteomyelitis are identified. Partially imaged prior second ray resection. No acute fracture or dislocation. Ligaments Intact medial and lateral ankle ligaments. Intact Lisfranc ligament. Muscles and Tendons Distal Achilles tendinosis. The Achilles tendon inserts onto abnormal, partially eroded posterior calcaneus. Findings of denervation or myositis involving the lower leg and foot musculature. No tenosynovitis. Soft tissues Large soft tissue wound at the heel. No  organized or drainable fluid collections. IMPRESSION: 1. Large soft tissue wound at the heel with extensive findings of acute osteomyelitis involving the calcaneus. 2. Trace tibiotalar and posterior subtalar joint effusions, favored reactive. 3. Distal Achilles tendinosis. The Achilles tendon inserts onto abnormal, partially eroded posterior calcaneus. 4. Findings of denervation or myositis involving the lower leg and foot musculature. Electronically Signed   By: Davina Poke D.O.   On: 06/27/2022 20:18   DG Foot Complete Left  Result Date: 06/27/2022 CLINICAL DATA:  Diabetes, cellulitis, left heel wound EXAM: LEFT FOOT - COMPLETE 3+ VIEW COMPARISON:  04/02/2022 FINDINGS: Frontal, oblique, and lateral views of the left foot are obtained. Bones are severely osteopenic. Prior second digit amputation at the level of the metatarsal. There is a large soft tissue defect within the hindfoot, which exposes the underlying dorsal cortex of the calcaneus. Next lucency and sclerosis of the dorsal calcaneus consistent with osteomyelitis. 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 Diffuse atherosclerosis. IMPRESSION: 1. Large soft tissue defect overlying the calcaneus, with mottled lucency and sclerosis of the underlying dorsal margin of the calcaneus compatible with osteomyelitis. 2. Diffuse osteopenia. Electronically Signed   By: Randa Ngo M.D.   On: 06/27/2022 19:24   (Echo, Carotid, EGD, Colonoscopy, ERCP)    Subjective: Patient seen and examined.  Denies any complaints.  She does have postop pain and wants to use some oxycodone.  Agreeable to go back to nursing home today.   Discharge Exam: Vitals:   07/04/22 0356 07/04/22 0800  BP: (!) 158/70 (!) 171/81  Pulse: 86 87  Resp: 18 20  Temp: 98.3 F (36.8 C) 98.5 F (36.9 C)  SpO2: 98% 100%   Vitals:   07/03/22 2006 07/04/22 0356 07/04/22 0500 07/04/22 0800  BP: (!)  144/82 (!) 158/70  (!) 171/81  Pulse: 91 86  87  Resp: _0 Temp: 98.2 F (36.8 C) 98.3 F (36.8 C)  98.5 F (36.9 C)  TempSrc: Oral Oral  Oral  SpO2: 100% 98%  100%  Weight:   48.1 kg   Height:        General: Pt is alert, awake, not in acute distress, on room air flat affect.  Chronically sick looking and debilitated. Cardiovascular: RRR, S1/S2 +, no rubs, no gallops Respiratory: CTA bilaterally, no wheezing, no rhonchi Abdominal: Soft, NT, ND, bowel sounds + Extremities:  Right AKA stump clean and dry. Left AKA stump with staples intact, minimal postop swelling present as in picture. AV fistula right arm.    The results of significant diagnostics from this hospitalization (including imaging, microbiology, ancillary and laboratory) are listed below for reference.  Microbiology: Recent Results (from the past 240 hour(s))  Blood Cultures x 2 sites     Status: None   Collection Time: 06/27/22  2:44 AM   Specimen: BLOOD LEFT HAND  Result Value Ref Range Status   Specimen Description BLOOD LEFT HAND  Final   Special Requests   Final    BOTTLES DRAWN AEROBIC AND ANAEROBIC Blood Culture results may not be optimal due to an inadequate volume of blood received in culture bottles   Culture   Final    NO GROWTH 5 DAYS Performed at [facility], [place] [address] [place], [place] [address]    Report Status 07/03/2022 FINAL  Final  Blood Cultures x 2 sites     Status: None   Collection Time: 06/28/22  6:25 AM   Specimen: BLOOD RIGHT ARM  Result Value Ref Range Status   Specimen Description BLOOD RIGHT ARM  Final   Special Requests   Final    BOTTLES DRAWN AEROBIC AND ANAEROBIC Blood Culture adequate volume   Culture   Final    NO GROWTH 5 DAYS Performed at [facility], [place] [employer] [address], [place], [place] [address]    Report Status 07/03/2022 FINAL  Final  Surgical PCR screen     Status: None   Collection Time: 07/02/22  5:30 AM   Specimen: Nasal Mucosa;  Nasal Swab  Result Value Ref Range Status   MRSA, PCR NEGATIVE NEGATIVE Final   Staphylococcus aureus NEGATIVE NEGATIVE Final    Comment: (NOTE) The Xpert SA Assay (FDA approved for NASAL specimens in patients 26 years of age and older), is one component of a comprehensive surveillance program. It is not intended to diagnose infection nor to guide or monitor treatment. Performed at [facility], [place] [address], [place], [place] [address]      Labs: BNP (last 3 results) No results for input(s): "BNP" in the last 8760 hours. Basic Metabolic Panel: Recent Labs  Lab 06/28/22 0244 06/28/22 1330 06/29/22 1416 07/01/22 0554 07/02/22 0828 07/03/22 0456 07/04/22 0733  NA 142  --  137 134* 133* 136 132*  K 4.3  --  4.9 5.1 4.7 5.3* 4.3  CL 94*  --  92* 91* 96* 96* 89*  CO2 28  --  31 25  --  23 24  GLUCOSE 185*  --  83 269* 266* 89 87  BUN 31*  --  53* 51* 35* 37* 22*  CREATININE 4.68*  --  6.80* 6.19* 5.50* 5.66* 4.18*  CALCIUM 9.3  --  9.3 8.7*  --  8.7* 8.8*  MG  --  2.2  --   --   --   --   --   PHOS  --  4.6  --  4.4  --   --   --    Liver Function Tests: Recent Labs  Lab 06/28/22 0244 06/29/22 1416 07/01/22 0554  AST 20 15  --   ALT 10 9  --   ALKPHOS 101 92  --   BILITOT 0.3 0.4  --   PROT 7.6 6.8  --   ALBUMIN 2.7* 2.4* 2.4*   No results for input(s): "LIPASE", "AMYLASE" in the last 168 hours. No results for input(s): "AMMONIA" in the last 168 hours. CBC: Recent Labs  Lab 06/28/22 0244 06/29/22 1416 07/01/22 0554 07/02/22 0828 07/03/22 0456 07/04/22 0733  WBC 8.0 8.1 7.9  --  9.8 8.5  NEUTROABS 5.4  --   --   --   --   --  HGB 11.8* 11.1* 12.1 15.3* 11.2* 12.5  HCT 38.9 35.1* 38.8 45.0 37.3 40.3  MCV 85.1 81.4 83.4  --  85.2 84.3  PLT 185 195 175  --  192 210   Cardiac Enzymes: No results for input(s): "CKTOTAL", "CKMB", "CKMBINDEX", "TROPONINI" in the last 168 hours. BNP: Invalid input(s): "POCBNP" CBG: Recent Labs  Lab 07/02/22 2124  07/03/22 0753 07/03/22 1131 07/03/22 2115 07/04/22 0811  GLUCAP 95 84 100* 93 110*   D-Dimer No results for input(s): "DDIMER" in the last 72 hours. Hgb A1c No results for input(s): "HGBA1C" in the last 72 hours. Lipid Profile No results for input(s): "CHOL", "HDL", "LDLCALC", "TRIG", "CHOLHDL", "LDLDIRECT" in the last 72 hours. Thyroid function studies No results for input(s): "TSH", "T4TOTAL", "T3FREE", "THYROIDAB" in the last 72 hours.  Invalid input(s): "FREET3" Anemia work up No results for input(s): "VITAMINB12", "FOLATE", "FERRITIN", "TIBC", "IRON", "RETICCTPCT" in the last 72 hours. Urinalysis    Component Value Date/Time   COLORURINE YELLOW 02/21/2019 1955   APPEARANCEUR TURBID (A) 02/21/2019 1955   LABSPEC 1.012 02/21/2019 1955   PHURINE 7.0 02/21/2019 1955   GLUCOSEU >=500 (A) 02/21/2019 1955   HGBUR MODERATE (A) 02/21/2019 1955   BILIRUBINUR NEGATIVE 02/21/2019 [place] NEGATIVE 02/21/2019 1955   PROTEINUR 100 (A) 02/21/2019 1955   NITRITE NEGATIVE 02/21/2019 1955   LEUKOCYTESUR MODERATE (A) 02/21/2019 1955   Sepsis Labs Recent Labs  Lab 06/29/22 1416 07/01/22 0554 07/03/22 0456 07/04/22 0733  WBC 8.1 7.9 9.8 8.5   Microbiology Recent Results (from the past 240 hour(s))  Blood Cultures x 2 sites     Status: None   Collection Time: 06/27/22  2:44 AM   Specimen: BLOOD LEFT HAND  Result Value Ref Range Status   Specimen Description BLOOD LEFT HAND  Final   Special Requests   Final    BOTTLES DRAWN AEROBIC AND ANAEROBIC Blood Culture results may not be optimal due to an inadequate volume of blood received in culture bottles   Culture   Final    NO GROWTH 5 DAYS Performed at [facility], [place] [address], [place], [place] [address]    Report Status 07/03/2022 FINAL  Final  Blood Cultures x 2 sites     Status: None   Collection Time: 06/28/22  6:25 AM   Specimen: BLOOD RIGHT ARM  Result Value Ref Range Status   Specimen Description BLOOD  RIGHT ARM  Final   Special Requests   Final    BOTTLES DRAWN AEROBIC AND ANAEROBIC Blood Culture adequate volume   Culture   Final    NO GROWTH 5 DAYS Performed at [facility], [place] [address] [place], [place] [address]    Report Status 07/03/2022 FINAL  Final  Surgical PCR screen     Status: None   Collection Time: 07/02/22  5:30 AM   Specimen: Nasal Mucosa; Nasal Swab  Result Value Ref Range Status   MRSA, PCR NEGATIVE NEGATIVE Final   Staphylococcus aureus NEGATIVE NEGATIVE Final    Comment: (NOTE) The Xpert SA Assay (FDA approved for NASAL specimens in patients 95 years of age and older), is one component of a comprehensive surveillance program. It is not intended to diagnose infection nor to guide or monitor treatment. Performed at [facility], [place] [address] [place], [place] [address]      Time coordinating discharge: 35 minutes  SIGNED:   Barb Merino, MD  [facility] 07/04/2022, 10:34 AM

## 2022-07-04 NOTE — Progress Notes (Signed)
Patient was DC, left at 1334 with PTAR in no obvious distress

## 2022-07-04 NOTE — Progress Notes (Signed)
   VASCULAR SURGERY ASSESSMENT & PLAN:   POD 2 LEFT AKA: I changed her dressing this morning and the wound looks fine.  Begin daily dressing changes.  Vascular surgery will follow peripherally.  SUBJECTIVE:   Pain adequately controlled.  PHYSICAL EXAM:   Vitals:   07/03/22 1920 07/03/22 2006 07/04/22 0356 07/04/22 0500  BP: (!) 178/62 (!) 144/82 (!) 158/70   Pulse: 91 91 86   Resp: (!) 22 17 18    Temp: 97.8 F (36.6 C) 98.2 F (36.8 C) 98.3 F (36.8 C)   TempSrc: Oral Oral Oral   SpO2: 98% 100% 98%   Weight: 47.5 kg   48.1 kg  Height:       Her left AKA looks good.   LABS:   Lab Results  Component Value Date   WBC 9.8 07/03/2022   HGB 11.2 (L) 07/03/2022   HCT 37.3 07/03/2022   MCV 85.2 07/03/2022   PLT 192 07/03/2022    Lab Results  Component Value Date   INR 1.3 (H) 06/28/2022   CBG (last 3)  Recent Labs    07/03/22 0753 07/03/22 1131 07/03/22 2115  GLUCAP 84 100* 93    PROBLEM LIST:    Principal Problem:   Osteomyelitis of ankle or foot, acute, left (HCC) Active Problems:   PVD (peripheral vascular disease) ([place])   Secondary hyperparathyroidism of renal origin ([place])   ESRD on hemodialysis ([place])   Type 2 diabetes mellitus with hyperglycemia, with long-term current use of insulin (HCC)   CURRENT MEDS:    atorvastatin  40 mg Oral Daily   Chlorhexidine Gluconate Cloth  6 each Topical Q0600   cinacalcet  90 mg Oral Daily   docusate sodium  100 mg Oral Daily   dorzolamide-timolol  1 drop Left Eye BID   feeding supplement (NEPRO CARB STEADY)  237 mL Oral TID BM   heparin  5,000 Units Subcutaneous Q8H   insulin aspart  0-5 Units Subcutaneous QHS   insulin aspart  0-6 Units Subcutaneous TID WC   leptospermum manuka honey  1 Application Topical Daily   midodrine  5 mg Oral TID WC   multivitamin with minerals  1 tablet Oral q morning   mupirocin ointment  1 Application Nasal BID   nystatin  5 mL Oral QID   pantoprazole  40 mg Oral Daily    sennosides  5 mL Per Tube BID   sodium chloride flush  3 mL Intravenous Q12H   thiamine  100 mg Per Tube Daily    Deitra Mayo Office: [phone] 07/04/2022

## 2022-07-04 NOTE — Progress Notes (Signed)
Contacted by RN CM regarding pt's d/c today to snf. Contacted [facility] to advise clinic of pt's d/c today and that pt will resume care on Friday.   Melven Sartorius Renal Navigator [phone]

## 2022-07-10 ENCOUNTER — Ambulatory Visit: Payer: Medicare Other | Admitting: Vascular Surgery

## 2022-07-23 ENCOUNTER — Inpatient Hospital Stay (HOSPITAL_COMMUNITY)
Admission: EM | Admit: 2022-07-23 | Discharge: 2022-07-27 | DRG: 280 | Disposition: A | Discharge status: Skilled Nursing Facility | Payer: Medicare Other | Attending: Internal Medicine | Admitting: Internal Medicine

## 2022-07-23 ENCOUNTER — Other Ambulatory Visit: Payer: Self-pay

## 2022-07-23 ENCOUNTER — Emergency Department (HOSPITAL_COMMUNITY): Payer: Medicare Other

## 2022-07-23 ENCOUNTER — Encounter (HOSPITAL_COMMUNITY): Payer: Self-pay

## 2022-07-23 ENCOUNTER — Encounter (HOSPITAL_COMMUNITY)
Admission: EM | Disposition: A | Discharge status: Skilled Nursing Facility | Payer: Self-pay | Source: Home / Self Care | Attending: Internal Medicine

## 2022-07-23 DIAGNOSIS — Z794 Long term (current) use of insulin: Secondary | ICD-10-CM

## 2022-07-23 DIAGNOSIS — Z89611 Acquired absence of right leg above knee: Secondary | ICD-10-CM

## 2022-07-23 DIAGNOSIS — Z8673 Personal history of transient ischemic attack (TIA), and cerebral infarction without residual deficits: Secondary | ICD-10-CM

## 2022-07-23 DIAGNOSIS — I120 Hypertensive chronic kidney disease with stage 5 chronic kidney disease or end stage renal disease: Secondary | ICD-10-CM | POA: Diagnosis present

## 2022-07-23 DIAGNOSIS — N186 End stage renal disease: Secondary | ICD-10-CM | POA: Diagnosis not present

## 2022-07-23 DIAGNOSIS — Z880 Allergy status to penicillin: Secondary | ICD-10-CM

## 2022-07-23 DIAGNOSIS — I25119 Atherosclerotic heart disease of native coronary artery with unspecified angina pectoris: Secondary | ICD-10-CM | POA: Diagnosis present

## 2022-07-23 DIAGNOSIS — Z89612 Acquired absence of left leg above knee: Secondary | ICD-10-CM

## 2022-07-23 DIAGNOSIS — Z992 Dependence on renal dialysis: Secondary | ICD-10-CM

## 2022-07-23 DIAGNOSIS — Z91018 Allergy to other foods: Secondary | ICD-10-CM

## 2022-07-23 DIAGNOSIS — E785 Hyperlipidemia, unspecified: Secondary | ICD-10-CM | POA: Diagnosis present

## 2022-07-23 DIAGNOSIS — H5440 Blindness, one eye, unspecified eye: Secondary | ICD-10-CM | POA: Diagnosis present

## 2022-07-23 DIAGNOSIS — E1151 Type 2 diabetes mellitus with diabetic peripheral angiopathy without gangrene: Secondary | ICD-10-CM | POA: Diagnosis present

## 2022-07-23 DIAGNOSIS — I161 Hypertensive emergency: Secondary | ICD-10-CM | POA: Diagnosis present

## 2022-07-23 DIAGNOSIS — I214 Non-ST elevation (NSTEMI) myocardial infarction: Principal | ICD-10-CM | POA: Diagnosis present

## 2022-07-23 DIAGNOSIS — I249 Acute ischemic heart disease, unspecified: Secondary | ICD-10-CM

## 2022-07-23 DIAGNOSIS — L89152 Pressure ulcer of sacral region, stage 2: Secondary | ICD-10-CM | POA: Diagnosis present

## 2022-07-23 DIAGNOSIS — D631 Anemia in chronic kidney disease: Secondary | ICD-10-CM | POA: Diagnosis present

## 2022-07-23 DIAGNOSIS — E113599 Type 2 diabetes mellitus with proliferative diabetic retinopathy without macular edema, unspecified eye: Secondary | ICD-10-CM | POA: Diagnosis present

## 2022-07-23 DIAGNOSIS — Z833 Family history of diabetes mellitus: Secondary | ICD-10-CM

## 2022-07-23 DIAGNOSIS — I160 Hypertensive urgency: Secondary | ICD-10-CM | POA: Diagnosis not present

## 2022-07-23 DIAGNOSIS — I213 ST elevation (STEMI) myocardial infarction of unspecified site: Principal | ICD-10-CM

## 2022-07-23 DIAGNOSIS — Z79899 Other long term (current) drug therapy: Secondary | ICD-10-CM

## 2022-07-23 DIAGNOSIS — H5461 Unqualified visual loss, right eye, normal vision left eye: Secondary | ICD-10-CM | POA: Diagnosis present

## 2022-07-23 DIAGNOSIS — E1122 Type 2 diabetes mellitus with diabetic chronic kidney disease: Secondary | ICD-10-CM | POA: Diagnosis present

## 2022-07-23 DIAGNOSIS — I2510 Atherosclerotic heart disease of native coronary artery without angina pectoris: Secondary | ICD-10-CM | POA: Diagnosis present

## 2022-07-23 DIAGNOSIS — Z91010 Allergy to peanuts: Secondary | ICD-10-CM

## 2022-07-23 DIAGNOSIS — M898X9 Other specified disorders of bone, unspecified site: Secondary | ICD-10-CM | POA: Diagnosis present

## 2022-07-23 DIAGNOSIS — Z885 Allergy status to narcotic agent status: Secondary | ICD-10-CM

## 2022-07-23 LAB — CBC
HCT: 36.2 % (ref 36.0–46.0)
Hemoglobin: 10.9 g/dL — ABNORMAL LOW (ref 12.0–15.0)
MCH: 26 pg (ref 26.0–34.0)
MCHC: 30.1 g/dL (ref 30.0–36.0)
MCV: 86.2 fL (ref 80.0–100.0)
Platelets: 226 10*3/uL (ref 150–400)
RBC: 4.2 MIL/uL (ref 3.87–5.11)
RDW: 21.2 % — ABNORMAL HIGH (ref 11.5–15.5)
WBC: 6.6 10*3/uL (ref 4.0–10.5)
nRBC: 0.6 % — ABNORMAL HIGH (ref 0.0–0.2)

## 2022-07-23 LAB — BASIC METABOLIC PANEL
Anion gap: 12 (ref 5–15)
BUN: 18 mg/dL (ref 6–20)
CO2: 28 mmol/L (ref 22–32)
Calcium: 8.9 mg/dL (ref 8.9–10.3)
Chloride: 97 mmol/L — ABNORMAL LOW (ref 98–111)
Creatinine, Ser: 4.17 mg/dL — ABNORMAL HIGH (ref 0.44–1.00)
GFR, Estimated: 13 mL/min — ABNORMAL LOW (ref >60)
Glucose, Bld: 271 mg/dL — ABNORMAL HIGH (ref 70–99)
Potassium: 4.9 mmol/L (ref 3.5–5.1)
Sodium: 137 mmol/L (ref 135–145)

## 2022-07-23 LAB — TROPONIN I (HIGH SENSITIVITY): Troponin I (High Sensitivity): 27 ng/L — ABNORMAL HIGH (ref <18)

## 2022-07-23 SURGERY — CORONARY/GRAFT ACUTE MI REVASCULARIZATION
Anesthesia: LOCAL

## 2022-07-23 MED ORDER — INSULIN ASPART 100 UNIT/ML IJ SOLN
0.0000 [IU] | INTRAMUSCULAR | Status: DC
  Administered 2022-07-24: 2 [IU] via SUBCUTANEOUS
  Administered 2022-07-24: 1 [IU] via SUBCUTANEOUS
  Administered 2022-07-24: 2 [IU] via SUBCUTANEOUS
  Administered 2022-07-26: 1 [IU] via SUBCUTANEOUS
  Administered 2022-07-26 – 2022-07-27 (×2): 2 [IU] via SUBCUTANEOUS

## 2022-07-23 MED ORDER — HEPARIN SODIUM (PORCINE) 5000 UNIT/ML IJ SOLN
60.0000 [IU]/kg | Freq: Once | INTRAMUSCULAR | Status: AC
  Administered 2022-07-23: 2900 [IU] via INTRAVENOUS

## 2022-07-23 MED ORDER — INSULIN GLARGINE-YFGN 100 UNIT/ML ~~LOC~~ SOPN
5.0000 [IU] | PEN_INJECTOR | Freq: Two times a day (BID) | SUBCUTANEOUS | Status: DC
  Filled 2022-07-23: qty 3

## 2022-07-23 MED ORDER — LIDOCAINE HCL (PF) 1 % IJ SOLN
INTRAMUSCULAR | Status: AC
  Filled 2022-07-23: qty 30

## 2022-07-23 MED ORDER — NITROGLYCERIN 1 MG/10 ML FOR IR/CATH LAB
INTRA_ARTERIAL | Status: AC
  Filled 2022-07-23: qty 10

## 2022-07-23 MED ORDER — ONDANSETRON HCL 4 MG/2ML IJ SOLN: 4.0000 mg | Freq: Four times a day (QID) | INTRAMUSCULAR | Status: DC | PRN

## 2022-07-23 MED ORDER — NITROGLYCERIN IN D5W 200-5 MCG/ML-% IV SOLN
0.0000 ug/min | INTRAVENOUS | Status: DC
  Administered 2022-07-23: 5 ug/min via INTRAVENOUS
  Filled 2022-07-23: qty 250

## 2022-07-23 MED ORDER — INSULIN GLARGINE-YFGN 100 UNIT/ML ~~LOC~~ SOLN
5.0000 [IU] | Freq: Two times a day (BID) | SUBCUTANEOUS | Status: DC
  Administered 2022-07-24 (×3): 5 [IU] via SUBCUTANEOUS
  Filled 2022-07-23 (×6): qty 0.05

## 2022-07-23 MED ORDER — CLOPIDOGREL BISULFATE 300 MG PO TABS
300.0000 mg | ORAL_TABLET | Freq: Once | ORAL | Status: AC
  Administered 2022-07-23: 300 mg via ORAL
  Filled 2022-07-23: qty 1

## 2022-07-23 MED ORDER — HEPARIN (PORCINE) 25000 UT/250ML-% IV SOLN
950.0000 [IU]/h | INTRAVENOUS | Status: DC
  Administered 2022-07-23: 600 [IU]/h via INTRAVENOUS
  Filled 2022-07-23 (×2): qty 250

## 2022-07-23 MED ORDER — ATORVASTATIN CALCIUM 40 MG PO TABS
40.0000 mg | ORAL_TABLET | Freq: Every day | ORAL | Status: DC
  Administered 2022-07-24 – 2022-07-27 (×4): 40 mg
  Filled 2022-07-23 (×4): qty 1

## 2022-07-23 MED ORDER — ASPIRIN 81 MG PO CHEW: 324.0000 mg | CHEWABLE_TABLET | Freq: Once | ORAL | Status: DC

## 2022-07-23 MED ORDER — ACETAMINOPHEN 325 MG PO TABS
650.0000 mg | ORAL_TABLET | ORAL | Status: DC | PRN
  Administered 2022-07-25 – 2022-07-27 (×2): 650 mg via ORAL
  Filled 2022-07-23 (×3): qty 2

## 2022-07-23 MED ORDER — ASPIRIN 81 MG PO TBEC
81.0000 mg | DELAYED_RELEASE_TABLET | Freq: Every day | ORAL | Status: DC
  Administered 2022-07-24 – 2022-07-27 (×4): 81 mg via ORAL
  Filled 2022-07-23 (×4): qty 1

## 2022-07-23 NOTE — Progress Notes (Signed)
ANTICOAGULATION CONSULT NOTE - Initial Consult  Pharmacy Consult for Heparin Infusion Indication: chest pain/ACS  Allergies  Allergen Reactions   Morphine Rash and Other (See Comments)   Peanut-Containing Drug Products Anaphylaxis and Hives   Penicillins Rash and Other (See Comments)    Rash in 2008.  Tolerated cefazolin in 2020   Chocolate Hives    Patient Measurements: Height: 5\' 1"  (154.9 cm) Weight: 48.1 kg (106 lb 0.7 oz) IBW/kg (Calculated) : 47.8 Heparin Dosing Weight: 48.1 kg  Vital Signs: Temp: 98.3 F (36.8 C) (12/11 2132) Temp Source: Oral (12/11 2132) BP: 188/87 (12/11 2215) Pulse Rate: 92 (12/11 2215)  Labs: Recent Labs    07/23/22 2120  HGB 10.9*  HCT 36.2  PLT 226  CREATININE 4.17*  TROPONINIHS 27*    Estimated Creatinine Clearance: 13.5 mL/min (A) (by C-G formula based on SCr of 4.17 mg/dL (H)).   Medical History: Past Medical History:  Diagnosis Date   Anemia    Anxiety    Blind right eye 2008   Diabetes mellitus without complication ([place])    Embolic stroke ([place])    ESRD (end stage renal disease) ([place])    HD on M/W/F   ESRD on hemodialysis ([place])    GBS (Guillain Barre syndrome) (HCC)     Medications:  (Not in a hospital admission)   Assessment: 41 yo F BIB EMS for Code STEMI. Pt received aspirin 324mg  x 1 with heparin 2900 units (~60 units/kg) IV bolus in ED. Code STEMI cancelled, determined to be NSTEMI. Pt is not taking anticoagulation prior to admission. Pharmacy consulted to dose heparin infusion per ACS protocol.   Hgb 10.9, Plt 226 on admission  Goal of Therapy:  Heparin level 0.3-0.7 units/ml Monitor platelets by anticoagulation protocol: Yes   Plan:  Start heparin infusion at 600 units/hr Check heparin level in 6 hours Monitor daily CBC, heparin level, and for s/sx of bleeding  Luisa Hart, PharmD, BCPS Clinical Pharmacist 07/23/2022 10:25 PM   Please refer to [facility]ION for pharmacy phone number

## 2022-07-23 NOTE — Assessment & Plan Note (Addendum)
Cont home lantus 5u BID Very sensitive SSI Q4H while NPO

## 2022-07-23 NOTE — Assessment & Plan Note (Signed)
Just had dialysis earlier today. Call nephrology in AM for dialysis during hospital stay.

## 2022-07-23 NOTE — ED Triage Notes (Signed)
Pt arrived to ED via [facility] from [place]. Pt c/o chest pain that started after dialysis today. Code STEMI called at 2045. Cardiologist fellow at bedside on arrival. Pt c/o mid-sternal chest pain radiating into abdomen. Pt moaning c/o pain in buttocks. Pt states she has a wound there and it is hurting. Pt states she has some shortness of breath, denies nausea/vomiting.

## 2022-07-23 NOTE — Hospital Course (Signed)
CARDIOLOGY FOLLOW-up  Lying flat and resting comfortably in bed.  Watching television. Says she feels better.  Says she is hungry. Continue IV heparin and nitroglycerin (to bring systolic blood pressure under 140 mmHg). Cardiac markers need to be tracked. Should get 2D Doppler echocardiogram in the morning.

## 2022-07-23 NOTE — H&P (Addendum)
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  Chest discomfort started after dialysis earlier today at 2:00.  Discomfort has persisted and therefore EMS was called by her facility.   Also complains of buttocks pain stating that she has a sore on her buttocks.  She is mostly bedridden and lives in a facility.  Recent left foot acute osteomyelitis ultimately resulting in above-the-knee amputation.  Review of November angiogram of the abdomen and lower extremities demonstrates widely patent right femoral and aortoiliac artery.  Left side was not visualized in December 2022.  Blood pressure 168/75 mmHg, heart rate 89 bpm ECG demonstrates less than 1 mm of ST elevation in II, III, and aVF.  Suggestive but not diagnostic of inferior STEMI. Both lower extremities amputated of above the knee.  Staples are still located in the left lower extremity stump.  No drainage is noted.  Cardiac exam reveals no significant murmur or rub.  Chest is clear anterior and posterior.  Abdomen soft.  The patient is in a diaper.  Unable to palpate left femoral but cannot palpate right femoral artery.  HEENT exam reveals poor dentition.  IMPRESSION: Acute coronary syndrome in the setting of severe diffuse vasculopathy including brain, (prior strokes), lower extremity amputations due to infection and osteomyelitis, end-stage renal disease on chronic dialysis, and currently poorly controlled hypertension.  Suspect the patient has multivessel coronary disease and would certainly not be a candidate for surgery.  The widespread nature of her vascular disease is devastating at such a young age.  PLAN: Initiate IV heparin and IV nitroglycerin to better control the blood  pressure and improve myocardial perfusion. Load with Plavix. Opiate therapy for analgesia.  2D Doppler echocardiogram in AM.  Given the minor changes on EKG, I feel the risk-benefit ratio is difficult to determine but I am making the clinical judgment that initial conservative medical management is most prudent.  Escalation of therapy will depend upon how she responds to IV nitrates and heparin.  Will likely need coronary angiography to define anatomy either later this evening or hopefully electively in the next day or 2 after we have more data and the perspective of her clinical trajectory.  We will have general medicine/hospitalist service to help [place] navigate goals of care, and help manage the myriad of medical problems..  Nephrology will also need to be contacted.   ADDENDUM: At 10:20 PM, patient is lying comfortably in bed, watching television, and asking for a cola and some food to eat.  Chest discomfort is improved.

## 2022-07-23 NOTE — ED Notes (Signed)
Pt changed and linen replaced. Pillows placed under pt per pt specification.

## 2022-07-23 NOTE — H&P (Signed)
History and Physical    Patient: [name] FVC:[id number] DOB: Dec 13, 1980 DOA: 07/23/2022 DOS: the patient was seen and examined on 07/23/2022 PCP: [facility]  Patient coming from: SNF  Chief Complaint:  Chief Complaint  Patient presents with   Chest Pain   HPI: [name] is a 41 y.o. female with medical history significant of ESRD on MWF dialysis, DM2, HTN.  Extensive history of atherosclerotic vascular dz including PAD, stroke, etc.  Pt with BLE AKAs, just had AKA last month for osteomyelitis of L foot.  Pt at dialysis this afternoon.  Finished session at 2pm.  Developed substernal CP.  Progressively worse this afternoon.  EMS called.  Initial ECG showed inferior ST elevation that was borderline, code STEMI called.  Cards felt she doesn't meet criteria for STEMI (see cardiology and interventional cards consult notes in chart) and they recommended medicine admission with initial conservative management.  ***  Complaining of pain to R sacrum. Review of Systems: As mentioned in the history of present illness. All other systems reviewed and are negative. Past Medical History:  Diagnosis Date   Anemia    Anxiety    Blind right eye 2008   Diabetes mellitus without complication ([place])    Embolic stroke ([place])    ESRD (end stage renal disease) ([place])    HD on M/W/F   ESRD on hemodialysis ([place])    GBS (Guillain Barre syndrome) ([place])    Past Surgical History:  Procedure Laterality Date   AMPUTATION Right 08/11/2021   Procedure: TRANSMETATARSAL AMPUTATION OF RIGHT FOOT;  Surgeon: Serafina Mitchell, MD;  Location: [place];  Service: Vascular;  Laterality: Right;   AMPUTATION Right 09/17/2021   Procedure: AMPUTATION RIGHT BELOW KNEE;  Surgeon: Serafina Mitchell, MD;  Location: [place];  Service: Vascular;  Laterality: Right;   AMPUTATION Right 10/11/2021   Procedure: RIGHT ABOVE KNEE AMPUTATION;  Surgeon: Serafina Mitchell, MD;  Location: [place];  Service:  Vascular;  Laterality: Right;   AMPUTATION Left 07/02/2022   Procedure: LEFT ABOVE KNEE AMPUTATION;  Surgeon: Angelia Mould, MD;  Location: [place];  Service: Vascular;  Laterality: Left;   AMPUTATION TOE Left    APPLICATION OF WOUND VAC  08/16/2021   Procedure: APPLICATION OF WOUND VAC;  Surgeon: Serafina Mitchell, MD;  Location: [place];  Service: Vascular;;   AV FISTULA PLACEMENT     BUBBLE STUDY  10/02/2021   Procedure: BUBBLE STUDY;  Surgeon: Fay Records, MD;  Location: [place];  Service: Cardiovascular;;   CESAREAN SECTION  2011   FISTULA SUPERFICIALIZATION Right 63/84/6659   Procedure: 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;  Surgeon: Angelia Mould, MD;  Location: [place];  Service: Vascular;  Laterality: Right;   INSERTION OF DIALYSIS CATHETER Left 07/27/2019   Procedure: INSERTION OF TUNNELED  DIALYSIS CATHETER;  Surgeon: Angelia Mould, MD;  Location: [place];  Service: Vascular;  Laterality: Left;   INSERTION OF DIALYSIS CATHETER Left 10/23/2021   Procedure: INSERTION OF TUNNELED PALINDROME PRECISION DIALYSIS CATHETER (23cm);  Surgeon: Marty Heck, MD;  Location: [facility] OR;  Service: Vascular;  Laterality: Left;   IR FLUORO GUIDE CV LINE LEFT  10/16/2021   IR FLUORO GUIDE CV LINE RIGHT  10/11/2021   IR GASTROSTOMY TUBE MOD SED  10/30/2021   IR GASTROSTOMY TUBE REMOVAL  05/08/2022   IR US GUIDE VASC ACCESS LEFT  10/16/2021   IR US GUIDE VASC ACCESS RIGHT  10/11/2021   LOWER EXTREMITY ANGIOGRAPHY N/A 08/11/2021   Procedure: LOWER EXTREMITY ANGIOGRAPHY;  Surgeon: Serafina Mitchell, MD;  Location: [place] CV LAB;  Service: Cardiovascular;  Laterality: N/A;   REVISON OF ARTERIOVENOUS FISTULA Right 10/23/2021   Procedure: REVISON OF ARTERIOVENOUS FISTULA  ARM AND PLICATION;  Surgeon: Marty Heck, MD;  Location: [place];  Service: Vascular;  Laterality: Right;   TEE WITHOUT CARDIOVERSION N/A 10/02/2021   Procedure: TRANSESOPHAGEAL ECHOCARDIOGRAM  (TEE);  Surgeon: Fay Records, MD;  Location: [facility] ENDOSCOPY;  Service: Cardiovascular;  Laterality: N/A;   TRANSMETATARSAL AMPUTATION Right 08/16/2021   Procedure: CLOSURE OF RIGHT TRANSMETATARSAL AMPUTATION;  Surgeon: Serafina Mitchell, MD;  Location: [place];  Service: Vascular;  Laterality: Right;   Social History:  reports that she has never smoked. She has never used smokeless tobacco. She reports that she does not drink alcohol and does not use drugs.  Allergies  Allergen Reactions   Morphine Rash and Other (See Comments)   Peanut-Containing Drug Products Anaphylaxis and Hives   Penicillins Rash and Other (See Comments)    Rash in 2008.  Tolerated cefazolin in 2020   Chocolate Hives    Family History  Problem Relation Age of Onset   Diabetes Mother    Diabetes Father     Prior to Admission medications   Medication Sig Start Date End Date Taking? Authorizing Provider  Accu-Chek FastClix Lancets MISC Use as instructed to check blood sugar up to TID. E11.22 Patient taking differently: 1 each by Other route See admin instructions. Use as instructed to check blood sugar up to TID. E11.22 04/15/19   Charlott Rakes, MD  atorvastatin (LIPITOR) 40 MG tablet Place 1 tablet (40 mg total) into feeding tube daily. 10/26/21   Shelly Coss, MD  Blood Glucose Monitoring Suppl (ACCU-CHEK GUIDE ME) w/Device KIT 1 kit by Does not apply route 3 (three) times daily. Use to check BG at home up to 3 times daily. E11.22 04/15/19   Charlott Rakes, MD  cinacalcet (SENSIPAR) 90 MG tablet Take 90 mg by mouth daily. 09/22/19   [provider]  Continuous Blood Gluc Transmit (DEXCOM G6 TRANSMITTER) MISC 1 Device by Does not apply route as directed. 09/12/20   Shamleffer, Melanie Crazier, MD  dorzolamide-timolol (COSOPT) 22.3-6.8 MG/ML ophthalmic solution Place 1 drop into the left eye 2 (two) times daily. 03/30/21   [provider]  Glucagon, rDNA, (GLUCAGON EMERGENCY) 1 MG KIT Inject 1 mg into the  muscle once. If BS drops below 60 06/10/22   [provider]  glucose blood (ACCU-CHEK GUIDE) test strip Use as instructed to check blood sugar up to TID. E11.22 Patient taking differently: 1 each by Other route See admin instructions. Use as instructed to check blood sugar up to TID. E11.22 07/17/19   Fulp, Cammie, MD  insulin aspart (NOVOLOG) 100 UNIT/ML injection Inject 0-6 Units into the skin every 4 (four) hours. Patient taking differently: Inject 1-9 Units into the skin in the morning, at noon, in the evening, and at bedtime. If BS is 121-150 inject 1 unit, BS 151-200 inject 2 units, BS 201-250 inject 3 units, BS 251-300 inject 5 units, BS 301-350 inject 7 units, BS 351-400 inject 9 units 10/25/21   Shelly Coss, MD  insulin glargine-yfgn (SEMGLEE) 100 UNIT/ML Pen Inject 5 Units into the skin 2 (two) times daily. 03/15/22   [provider]  Insulin Pen Needle 32G X 4 MM MISC 1 Device by Does not apply route as  directed. 05/13/20   Shamleffer, Melanie Crazier, MD  midodrine (PROAMATINE) 5 MG tablet Take 5 mg by mouth 3 (three) times daily. 03/03/22   [provider]  Multiple Vitamin (MULTIVITAMIN WITH MINERALS) TABS tablet Take 1 tablet by mouth daily.    [provider]  NON FORMULARY Place 30 mLs into feeding tube daily. ProHeal    [provider]  nystatin (MYCOSTATIN) 100000 UNIT/ML suspension Take 5 mLs (500,000 Units total) by mouth 4 (four) times daily. 10/25/21   Shelly Coss, MD  ondansetron (ZOFRAN) 4 MG tablet Take 4 mg by mouth every 6 (six) hours as needed for nausea/vomiting. 03/02/22   [provider]  sodium chloride flush (NS) 0.9 % SOLN Inject 10 mLs into the vein at bedtime.    [provider]  thiamine 100 MG tablet Place 1 tablet (100 mg total) into feeding tube daily. 10/26/21   Shelly Coss, MD    Physical Exam: Vitals:   07/23/22 2131 07/23/22 2132 07/23/22 2145 07/23/22 2215  BP: (!) 203/94  (!) 208/97  (!) 188/87  Pulse: 92  88 92  Resp: _0 Temp:  98.3 F (36.8 C)    TempSrc:  Oral    SpO2: 100%  96% 99%  Weight:      Height:       Constitutional: NAD, calm, comfortable Eyes: PERRL, lids and conjunctivae normal ENMT: Mucous membranes are moist. Posterior pharynx clear of any exudate or lesions.Normal dentition.  Neck: normal, supple, no masses, no thyromegaly Respiratory: clear to auscultation bilaterally, no wheezing, no crackles. Normal respiratory effort. No accessory muscle use.  Cardiovascular: Regular rate and rhythm, no murmurs / rubs / gallops. No extremity edema. 2+ pedal pulses. No carotid bruits.  Abdomen: no tenderness, no masses palpated. No hepatosplenomegaly. Bowel sounds positive.  Musculoskeletal: BLE AKAs Skin: Grade 2 sacral decubitus Neurologic: CN 2-12 grossly intact. Sensation intact, DTR normal. Strength 5/5 in all 4.  Psychiatric: Normal judgment and insight. Alert and oriented x 3. Normal mood.   Data Reviewed: {Tip this will not be part of the note when signed- Document your independent interpretation of telemetry tracing, EKG, lab, Radiology test or any other diagnostic tests. Add any new diagnostic test ordered today. (Optional):[id number]}   First trop 27     Latest Ref Rng & Units 07/23/2022    9:20 PM 07/04/2022    7:33 AM 07/03/2022    4:56 AM  CBC  WBC 4.0 - 10.5 K/uL 6.6  8.5  9.8   Hemoglobin 12.0 - 15.0 g/dL 10.9  12.5  11.2   Hematocrit 36.0 - 46.0 % 36.2  40.3  37.3   Platelets 150 - 400 K/uL 226  210  192       Latest Ref Rng & Units 07/23/2022    9:20 PM 07/04/2022    7:33 AM 07/03/2022    4:56 AM  CMP  Glucose 70 - 99 mg/dL 271  87  89   BUN 6 - 20 mg/dL 18  22  37   Creatinine 0.44 - 1.00 mg/dL 4.17  4.18  5.66   Sodium 135 - 145 mmol/L 137  132  136   Potassium 3.5 - 5.1 mmol/L 4.9  4.3  5.3   Chloride 98 - 111 mmol/L 97  89  96   CO2 22 - 32 mmol/L _1 Calcium 8.9 - 10.3 mg/dL 8.9  8.8  8.7    CXR = no  active disease  Assessment and Plan: * Chest pain due to CAD ([facility]) CP due to ACS vs HTN urgency -> per cards "Atypical chest pain with ST changes not meeting STEMI criteria" See cards and interventional cards consult notes in chart. ACS pathway NTG GTT Tele monitor Heparin gtt ASA Plavix load x1 NPO for ischemic work up 2d echo  Hypertensive urgency Stop midodrine (on prior med rec) if she's still taking this NTG GTT for CP and BP control at the moment.  End stage renal disease ([place]) Just had dialysis earlier today. Call nephrology in AM for dialysis during hospital stay.  Sacral decubitus ulcer, stage II ([place]) Wound care consult, off load as able.  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 Observation status is judged to be reasonable and necessary in order to provide the required intensity of service to ensure the patient's safety. The patient's presenting symptoms, physical exam findings, and initial radiographic and laboratory data in the context of their medical condition is felt to place them at decreased risk for further clinical deterioration. Furthermore, it is anticipated that the patient will be medically stable for discharge from the hospital within 2 midnights of admission.   Author: Etta Quill., DO 07/23/2022 11:51 PM  For on call review [email or web address].

## 2022-07-23 NOTE — ED Provider Notes (Signed)
[place] EMERGENCY DEPARTMENT Provider Note   CSN: [id number] Arrival date & time: 07/23/22  2054     History  Chief Complaint  Patient presents with   Chest Pain    [name] is a 41 y.o. female with past medical history significant for diabetes, ESRD on HD MWF, severe peripheral vascular disease s/p left AKA 07/02/2021, right AKA remotely, prior CVA, presenting initially as a code STEMI.  Patient reports that she began having chest pain after dialysis today.  She intermittently describes it as pulsating and sharp.  She rates it from 1-7 out of 10 in intensity.  She states it started approximately 2 or 2:30 PM.  She endorses nausea, denies vomiting.  She denies recent fever, cough, congestion.  HPI     Home Medications Prior to Admission medications   Medication Sig Start Date End Date Taking? Authorizing Provider  Accu-Chek FastClix Lancets MISC Use as instructed to check blood sugar up to TID. E11.22 Patient taking differently: 1 each by Other route See admin instructions. Use as instructed to check blood sugar up to TID. E11.22 04/15/19   Charlott Rakes, MD  atorvastatin (LIPITOR) 40 MG tablet Place 1 tablet (40 mg total) into feeding tube daily. 10/26/21   Shelly Coss, MD  Blood Glucose Monitoring Suppl (ACCU-CHEK GUIDE ME) w/Device KIT 1 kit by Does not apply route 3 (three) times daily. Use to check BG at home up to 3 times daily. E11.22 04/15/19   Charlott Rakes, MD  cinacalcet (SENSIPAR) 90 MG tablet Take 90 mg by mouth daily. 09/22/19   [provider]  Continuous Blood Gluc Transmit (DEXCOM G6 TRANSMITTER) MISC 1 Device by Does not apply route as directed. 09/12/20   Shamleffer, Melanie Crazier, MD  dorzolamide-timolol (COSOPT) 22.3-6.8 MG/ML ophthalmic solution Place 1 drop into the left eye 2 (two) times daily. 03/30/21   [provider]  Glucagon, rDNA, (GLUCAGON EMERGENCY) 1 MG KIT Inject 1 mg into the muscle once. If BS drops  below 60 06/10/22   [provider]  glucose blood (ACCU-CHEK GUIDE) test strip Use as instructed to check blood sugar up to TID. E11.22 Patient taking differently: 1 each by Other route See admin instructions. Use as instructed to check blood sugar up to TID. E11.22 07/17/19   Fulp, Cammie, MD  insulin aspart (NOVOLOG) 100 UNIT/ML injection Inject 0-6 Units into the skin every 4 (four) hours. Patient taking differently: Inject 1-9 Units into the skin in the morning, at noon, in the evening, and at bedtime. If BS is 121-150 inject 1 unit, BS 151-200 inject 2 units, BS 201-250 inject 3 units, BS 251-300 inject 5 units, BS 301-350 inject 7 units, BS 351-400 inject 9 units 10/25/21   Shelly Coss, MD  insulin glargine-yfgn (SEMGLEE) 100 UNIT/ML Pen Inject 5 Units into the skin 2 (two) times daily. 03/15/22   [provider]  Insulin Pen Needle 32G X 4 MM MISC 1 Device by Does not apply route as directed. 05/13/20   Shamleffer, Melanie Crazier, MD  midodrine (PROAMATINE) 5 MG tablet Take 5 mg by mouth 3 (three) times daily. 03/03/22   [provider]  Multiple Vitamin (MULTIVITAMIN WITH MINERALS) TABS tablet Take 1 tablet by mouth daily.    [provider]  NON FORMULARY Place 30 mLs into feeding tube daily. ProHeal    [provider]  nystatin (MYCOSTATIN) 100000 UNIT/ML suspension Take 5 mLs (500,000 Units total) by mouth 4 (four) times daily. 10/25/21  Shelly Coss, MD  ondansetron (ZOFRAN) 4 MG tablet Take 4 mg by mouth every 6 (six) hours as needed for nausea/vomiting. 03/02/22   [provider]  sodium chloride flush (NS) 0.9 % SOLN Inject 10 mLs into the vein at bedtime.    [provider]  thiamine 100 MG tablet Place 1 tablet (100 mg total) into feeding tube daily. 10/26/21   Shelly Coss, MD      Allergies    Morphine, Peanut-containing drug products, Penicillins, and Chocolate    Review of Systems   Review of  Systems  Physical Exam Updated Vital Signs BP (!) 188/87   Pulse 92   Temp 98.3 F (36.8 C) (Oral)   Resp 14   Ht _0  (1.549 m)   Wt 48.1 kg   LMP  (LMP Unknown)   SpO2 99%   BMI 20.04 kg/m  Physical Exam Constitutional:      General: She is in acute distress.     Appearance: She is ill-appearing.  HENT:     Head: Normocephalic.  Cardiovascular:     Rate and Rhythm: Normal rate and regular rhythm.     Comments: AV fistula in place on right, palpable thrill.  Left radial pulse is weak. Pulmonary:     Effort: Pulmonary effort is normal.     Breath sounds: Normal breath sounds.  Abdominal:     Tenderness: There is no abdominal tenderness. There is no guarding or rebound.  Musculoskeletal:     Comments: Status post AKA bilaterally, left AKA staples intact at limb end.  No erythema or warmth     ED Results / Procedures / Treatments   Labs (all labs ordered are listed, but only abnormal results are displayed) Labs Reviewed  BASIC METABOLIC PANEL - Abnormal; Notable for the following components:      Result Value   Chloride 97 (*)    Glucose, Bld 271 (*)    Creatinine, Ser 4.17 (*)    GFR, Estimated 13 (*)    All other components within normal limits  CBC - Abnormal; Notable for the following components:   Hemoglobin 10.9 (*)    RDW 21.2 (*)    nRBC 0.6 (*)    All other components within normal limits  TROPONIN I (HIGH SENSITIVITY) - Abnormal; Notable for the following components:   Troponin I (High Sensitivity) 27 (*)    All other components within normal limits  HCG, QUANTITATIVE, PREGNANCY  HEPARIN LEVEL (UNFRACTIONATED)  CBC  BASIC METABOLIC PANEL  LIPID PANEL  CBG MONITORING, ED  TROPONIN I (HIGH SENSITIVITY)    EKG EKG Interpretation  Date/Time:  Monday July 23 2022 20:56:20 EST Ventricular Rate:  89 PR Interval:  124 QRS Duration: 83 QT Interval:  399 QTC Calculation: 486 R Axis:   73 Text Interpretation: Sinus rhythm Probable left atrial  enlargement Anteroseptal infarct, age indeterminate ST elevation, consider inferior injury ST elevation in inferior leads noted - new Confirmed by Varney Biles [phone]) on 07/23/2022 9:23:08 PM  Radiology DG Chest Portable 1 View  Result Date: 07/23/2022 CLINICAL DATA:  Chest pain and shortness of breath EXAM: PORTABLE CHEST 1 VIEW .  Patient is rotated COMPARISON:  Chest x-ray 02/09/2022 FINDINGS: The heart and mediastinal contours are within normal limits. No focal consolidation. No pulmonary edema. No pleural effusion. No pneumothorax. No acute osseous abnormality. IMPRESSION: No active disease. Electronically Signed   By: Iven Finn M.D.   On: 07/23/2022 21:38    Procedures Procedures  Medications Ordered in ED Medications  aspirin chewable tablet 324 mg (324 mg Oral Patient Refused/Not Given 07/23/22 2111)  nitroGLYCERIN 50 mg in dextrose 5 % 250 mL (0.2 mg/mL) infusion (10 mcg/min Intravenous Rate/Dose Change 07/23/22 2230)  heparin ADULT infusion 100 units/mL (25000 units/215m) (600 Units/hr Intravenous New Bag/Given 07/23/22 2229)  atorvastatin (LIPITOR) tablet 40 mg (has no administration in time range)  acetaminophen (TYLENOL) tablet 650 mg (has no administration in time range)  ondansetron (ZOFRAN) injection 4 mg (has no administration in time range)  aspirin EC tablet 81 mg (has no administration in time range)  insulin aspart (novoLOG) injection 0-6 Units (has no administration in time range)  insulin glargine-yfgn (SEMGLEE) injection 5 Units (has no administration in time range)  heparin injection 2,900 Units (2,900 Units Intravenous Given 07/23/22 2110)  clopidogrel (PLAVIX) tablet 300 mg (300 mg Oral Given 07/23/22 2155)    ED Course/ Medical Decision Making/ A&P                           Medical Decision Making Amount and/or Complexity of Data Reviewed External Data Reviewed: notes. Labs: ordered. Decision-making details documented in ED Course. Radiology:  ordered and independent interpretation performed. Decision-making details documented in ED Course. ECG/medicine tests: ordered and independent interpretation performed. Decision-making details documented in ED Course.  Risk Prescription drug management. Decision regarding hospitalization.   Presents above.  She initially presented as a STEMI activation, cardiology arrived at bedside.  We attempted to give the patient ASA, however she states she cannot take it.  She is not sure why.  Differential diagnosis includes ACS, PTX, pneumonia.  Patient did have ST elevations in her EKG that were borderline.  She did not meet STEMI criteria.  Chest pain improved.  Patient is significantly concerned about the pain on her sacrum.  I discussed patient with cardiology.  Plan for Plavix load, heparin, nitro drip, admit.  Initial troponin 27.  BMP with normal potassium, normal sodium, no anion gap.  CBC with low hemoglobin at 10.9.  I discussed the patient with Dr. GAlcario Drought  He evaluated the patient and agreed to admit to his service.        Final Clinical Impression(s) / ED Diagnoses Final diagnoses:  Acute ST elevation myocardial infarction (STEMI), unspecified artery ([facility]    Rx / DC Orders ED Discharge Orders     None         WLuster Landsberg MD 07/24/22 0009    NVarney Biles MD 07/28/22 2222

## 2022-07-23 NOTE — Assessment & Plan Note (Signed)
Stop midodrine (on prior med rec) if she's still taking this NTG GTT for CP and BP control at the moment.

## 2022-07-23 NOTE — Assessment & Plan Note (Signed)
Chronic and baseline. 

## 2022-07-23 NOTE — Consult Note (Addendum)
Cardiology Consultation   Patient ID: Kailei Cowens MRN: [id number]; DOB: 1980-10-08  Admit date: 07/23/2022 Date of Consult: 07/23/2022  PCP:  [facility] Cardiologist:  None        Patient Profile:   [name] is a 41 y.o. female with a hx of DM2, ESRD, severe PVD s/p b/l AKAs who is being seen 07/23/2022 for the evaluation of chest pain at the request of Dr. Thelma Comp.  History of Present Illness:   [name] presented from [facility] with chest pain.  She states she was at dialysis this afternoon and finished her session when around 2:00 she developed substernal chest pain described as her heart beating hard or thumping. The pain progressively worsened this afternoon. The facility called EMS.  Initial ECG showed inferior ST elevation that was borderline and a code STEMI was called.  On arrival to the ED her pain had improved almost gone and her STE in inferior leads on ECG had improved. She was noted to be very hypertensive 208/97 mmHg but was complaining mostly of sacral pain. STEMI attending Dr. Tamala Julian evaluated patient in the emergency department and felt she did not need to come to the Cath Lab emergently and could be optimized from a BP stand point. She was given plavix load and heparin bolus. ASA was not given as patient says she cannot take ASA but doesn't know why. She was put on a nitro gtt and will be admitted to hospitalist.     Of note she was discharged on 1122 after a left lower extremity AKA. She denies any prior cath. Does not follow with cardiology.    Past Medical History:  Diagnosis Date   Anemia    Anxiety    Blind right eye 2008   Diabetes mellitus without complication ([place])    Embolic stroke ([place])    ESRD (end stage renal disease) ([place])    HD on M/W/F   ESRD on hemodialysis ([place])    GBS (Guillain Barre syndrome) ([place])     Past Surgical History:  Procedure Laterality Date    AMPUTATION Right 08/11/2021   Procedure: TRANSMETATARSAL AMPUTATION OF RIGHT FOOT;  Surgeon: Serafina Mitchell, MD;  Location: [place];  Service: Vascular;  Laterality: Right;   AMPUTATION Right 09/17/2021   Procedure: AMPUTATION RIGHT BELOW KNEE;  Surgeon: Serafina Mitchell, MD;  Location: [place];  Service: Vascular;  Laterality: Right;   AMPUTATION Right 10/11/2021   Procedure: RIGHT ABOVE KNEE AMPUTATION;  Surgeon: Serafina Mitchell, MD;  Location: [place];  Service: Vascular;  Laterality: Right;   AMPUTATION Left 07/02/2022   Procedure: LEFT ABOVE KNEE AMPUTATION;  Surgeon: Angelia Mould, MD;  Location: [place];  Service: Vascular;  Laterality: Left;   AMPUTATION TOE Left    APPLICATION OF WOUND VAC  08/16/2021   Procedure: APPLICATION OF WOUND VAC;  Surgeon: Serafina Mitchell, MD;  Location: [place];  Service: Vascular;;   AV FISTULA PLACEMENT     BUBBLE STUDY  10/02/2021   Procedure: BUBBLE STUDY;  Surgeon: Fay Records, MD;  Location: [place];  Service: Cardiovascular;;   CESAREAN SECTION  2011   FISTULA SUPERFICIALIZATION Right 35/57/3220   Procedure: 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;  Surgeon: Angelia Mould, MD;  Location: [place];  Service: Vascular;  Laterality: Right;   INSERTION OF DIALYSIS CATHETER Left 07/27/2019   Procedure: INSERTION OF TUNNELED  DIALYSIS CATHETER;  Surgeon: Angelia Mould, MD;  Location: [facility] OR;  Service: Vascular;  Laterality: Left;   INSERTION OF DIALYSIS CATHETER Left 10/23/2021   Procedure: INSERTION OF TUNNELED PALINDROME PRECISION DIALYSIS CATHETER (23cm);  Surgeon: Marty Heck, MD;  Location: [facility] OR;  Service: Vascular;  Laterality: Left;   IR FLUORO GUIDE CV LINE LEFT  10/16/2021   IR FLUORO GUIDE CV LINE RIGHT  10/11/2021   IR GASTROSTOMY TUBE MOD SED  10/30/2021   IR GASTROSTOMY TUBE REMOVAL  05/08/2022   IR US GUIDE VASC ACCESS LEFT  10/16/2021   IR US GUIDE VASC ACCESS RIGHT  10/11/2021   LOWER EXTREMITY  ANGIOGRAPHY N/A 08/11/2021   Procedure: LOWER EXTREMITY ANGIOGRAPHY;  Surgeon: Serafina Mitchell, MD;  Location: [place] CV LAB;  Service: Cardiovascular;  Laterality: N/A;   REVISON OF ARTERIOVENOUS FISTULA Right 10/23/2021   Procedure: REVISON OF ARTERIOVENOUS FISTULA  ARM AND PLICATION;  Surgeon: Marty Heck, MD;  Location: [place];  Service: Vascular;  Laterality: Right;   TEE WITHOUT CARDIOVERSION N/A 10/02/2021   Procedure: TRANSESOPHAGEAL ECHOCARDIOGRAM (TEE);  Surgeon: Fay Records, MD;  Location: [facility] ENDOSCOPY;  Service: Cardiovascular;  Laterality: N/A;   TRANSMETATARSAL AMPUTATION Right 08/16/2021   Procedure: CLOSURE OF RIGHT TRANSMETATARSAL AMPUTATION;  Surgeon: Serafina Mitchell, MD;  Location: [facility] OR;  Service: Vascular;  Laterality: Right;     Home Medications:  Prior to Admission medications   Medication Sig Start Date End Date Taking? Authorizing Provider  Accu-Chek FastClix Lancets MISC Use as instructed to check blood sugar up to TID. E11.22 Patient taking differently: 1 each by Other route See admin instructions. Use as instructed to check blood sugar up to TID. E11.22 04/15/19   Charlott Rakes, MD  atorvastatin (LIPITOR) 40 MG tablet Place 1 tablet (40 mg total) into feeding tube daily. 10/26/21   Shelly Coss, MD  Blood Glucose Monitoring Suppl (ACCU-CHEK GUIDE ME) w/Device KIT 1 kit by Does not apply route 3 (three) times daily. Use to check BG at home up to 3 times daily. E11.22 04/15/19   Charlott Rakes, MD  cinacalcet (SENSIPAR) 90 MG tablet Take 90 mg by mouth daily. 09/22/19   [provider]  Continuous Blood Gluc Transmit (DEXCOM G6 TRANSMITTER) MISC 1 Device by Does not apply route as directed. 09/12/20   Shamleffer, Melanie Crazier, MD  dorzolamide-timolol (COSOPT) 22.3-6.8 MG/ML ophthalmic solution Place 1 drop into the left eye 2 (two) times daily. 03/30/21   [provider]  Glucagon, rDNA, (GLUCAGON EMERGENCY) 1 MG KIT Inject 1 mg into the  muscle once. If BS drops below 60 06/10/22   [provider]  glucose blood (ACCU-CHEK GUIDE) test strip Use as instructed to check blood sugar up to TID. E11.22 Patient taking differently: 1 each by Other route See admin instructions. Use as instructed to check blood sugar up to TID. E11.22 07/17/19   Fulp, Cammie, MD  insulin aspart (NOVOLOG) 100 UNIT/ML injection Inject 0-6 Units into the skin every 4 (four) hours. Patient taking differently: Inject 1-9 Units into the skin in the morning, at noon, in the evening, and at bedtime. If BS is 121-150 inject 1 unit, BS 151-200 inject 2 units, BS 201-250 inject 3 units, BS 251-300 inject 5 units, BS 301-350 inject 7 units, BS 351-400 inject 9 units 10/25/21   Shelly Coss, MD  insulin glargine-yfgn (SEMGLEE) 100 UNIT/ML Pen Inject 5 Units into the skin 2 (two) times daily. 03/15/22   [provider]  Insulin Pen Needle 32G X 4 MM MISC 1 Device by Does not apply route as directed. 05/13/20   Shamleffer, Melanie Crazier, MD  midodrine (PROAMATINE) 5 MG tablet Take 5 mg by mouth 3 (three) times daily. 03/03/22   [provider]  Multiple Vitamin (MULTIVITAMIN WITH MINERALS) TABS tablet Take 1 tablet by mouth daily.    [provider]  NON FORMULARY Place 30 mLs into feeding tube daily. ProHeal    [provider]  nystatin (MYCOSTATIN) 100000 UNIT/ML suspension Take 5 mLs (500,000 Units total) by mouth 4 (four) times daily. 10/25/21   Shelly Coss, MD  ondansetron (ZOFRAN) 4 MG tablet Take 4 mg by mouth every 6 (six) hours as needed for nausea/vomiting. 03/02/22   [provider]  sodium chloride flush (NS) 0.9 % SOLN Inject 10 mLs into the vein at bedtime.    [provider]  thiamine 100 MG tablet Place 1 tablet (100 mg total) into feeding tube daily. 10/26/21   Shelly Coss, MD    Inpatient Medications: Scheduled Meds:  aspirin  324 mg Oral Once   Continuous Infusions:  heparin      nitroGLYCERIN 5 mcg/min (07/23/22 2155)   PRN Meds:   Allergies:    Allergies  Allergen Reactions   Morphine Rash and Other (See Comments)   Peanut-Containing Drug Products Anaphylaxis and Hives   Penicillins Rash and Other (See Comments)    Rash in 2008.  Tolerated cefazolin in 2020   Chocolate Hives    Social History:   Social History   Socioeconomic History   Marital status: Single    Spouse name: Not on file   Number of children: Not on file   Years of education: Not on file   Highest education level: Not on file  Occupational History   Not on file  Tobacco Use   Smoking status: Never   Smokeless tobacco: Never  Vaping Use   Vaping Use: Never used  Substance and Sexual Activity   Alcohol use: No   Drug use: No   Sexual activity: Not on file  Other Topics Concern   Not on file  Social History Narrative   Not on file   Social Determinants of Health   Financial Resource Strain: Not on file  Food Insecurity: No Food Insecurity (06/28/2022)   Hunger Vital Sign    Worried About Running Out of Food in the Last Year: Never true    Ran Out of Food in the Last Year: Never true  Transportation Needs: No Transportation Needs (06/28/2022)   PRAPARE - Hydrologist (Medical): No    Lack of Transportation (Non-Medical): No  Physical Activity: Not on file  Stress: Not on file  Social Connections: Not on file  Intimate Partner Violence: Not At Risk (06/28/2022)   Humiliation, Afraid, Rape, and Kick questionnaire    Fear of Current or Ex-Partner: No    Emotionally Abused: No    Physically Abused: No    Sexually Abused: No    Family History:   Family History  Problem Relation Age of Onset   Diabetes Mother    Diabetes Father      ROS:  Please see the history of present illness.  All other ROS reviewed and negative.     Physical Exam/Data:   Vitals:   07/23/22 2131 07/23/22 2132 07/23/22 2145 07/23/22 2215  BP: (!) 203/94  (!)  208/97 (!) 188/87  Pulse: 92  88  92  Resp: _0 Temp:  98.3 F (36.8 C)    TempSrc:  Oral    SpO2: 100%  96% 99%  Weight:      Height:       No intake or output data in the 24 hours ending 07/23/22 2226    07/23/2022    9:00 PM 07/23/2022    8:00 PM 07/04/2022    5:00 AM  Last 3 Weights  Weight (lbs) 106 lb 0.7 oz 106 lb 106 lb 0.7 oz  Weight (kg) 48.1 kg 48.081 kg 48.1 kg     Body mass index is 20.04 kg/m.  General:  Well nourished, well developed, in no acute distress HEENT: normal, poor dentition Neck: no JVD Vascular: absent b/l radial pulses Cardiac:  normal S1, S2; RRR; no murmur  Lungs:  clear to auscultation bilaterally, no wheezing, rhonchi or rales  Abd: soft, nontender, no hepatomegaly  Ext: b/l AKA, Left stump with staples c/d/I, RUE AVF with thrill Skin: warm and dry  Neuro:  CNs 2-12 intact, no focal abnormalities noted Psych:  Normal affect   EKG:  The EKG was personally reviewed and demonstrates:  NSR, septal infarct, ST changes in inferior leads not meeting STEMI criteria.  Telemetry:  Telemetry was personally reviewed and demonstrates:  NSR  Relevant CV Studies: ECHO 09/2021 IMPRESSIONS  1. Left ventricular ejection fraction, by estimation, is 70 to 75%. The  left ventricle has hyperdynamic function. The left ventricle has no  regional wall motion abnormalities. There is mild concentric left  ventricular hypertrophy. Left ventricular  diastolic parameters are indeterminate.   2. Right ventricular systolic function is normal. The right ventricular  size is normal.   3. The mitral valve is degenerative. No evidence of mitral valve  regurgitation. No evidence of mitral stenosis.   4. Possible bicuspid aortic valve. The aortic valve is calcified. Aortic  valve sclerosis/calcification is present, without any evidence of aortic  stenosis.     Aortic valve regurgitation is not visualized. No aortic stenosis is  present.   5. The inferior vena  cava is normal in size with greater than 50%  respiratory variability, suggesting right atrial pressure of 3 mmHg.   6. Evidence of atrial level shunting detected by color flow Doppler.  Agitated saline contrast bubble study was positive with shunting observed  within 3-6 cardiac cycles suggestive of interatrial shunt.   TEE 10/02/2021 IMPRESSIONS   1. Left ventricular ejection fraction, by estimation, is 70 to 75%. The  left ventricle has hyperdynamic function.   2. Right ventricular systolic function is normal. The right ventricular  size is normal.   3. No left atrial/left atrial appendage thrombus was detected.   4. The mitral valve is normal in structure. Mild mitral valve  regurgitation.   5. The aortic valve is tricuspid. Aortic valve regurgitation is not  visualized. Aortic valve sclerosis is present, with no evidence of aortic  valve stenosis.   6. Small PFO. Very small PFO as tested with injection of agitated saline  wit ha few bubbles seen in LA. Not clearly visible with color doppler .  There is a small patent foramen ovale.   Laboratory Data:  High Sensitivity Troponin:   Recent Labs  Lab 07/23/22 2120  TROPONINIHS 27*     Chemistry Recent Labs  Lab 07/23/22 2120  NA 137  K 4.9  CL 97*  CO2 28  GLUCOSE 271*  BUN 18  CREATININE 4.17*  CALCIUM 8.9  GFRNONAA 13*  ANIONGAP 12    No results for input(s): "PROT", "ALBUMIN", "AST", "ALT", "ALKPHOS", "BILITOT" in the last 168 hours. Lipids No results for input(s): "CHOL", "TRIG", "HDL", "LABVLDL", "LDLCALC", "CHOLHDL" in the last 168 hours.  Hematology Recent Labs  Lab 07/23/22 2120  WBC 6.6  RBC 4.20  HGB 10.9*  HCT 36.2  MCV 86.2  MCH 26.0  MCHC 30.1  RDW 21.2*  PLT 226   Thyroid No results for input(s): "TSH", "FREET4" in the last 168 hours.  BNPNo results for input(s): "BNP", "PROBNP" in the last 168 hours.  DDimer No results for input(s): "DDIMER" in the last 168 hours.   Radiology/Studies:   DG Chest Portable 1 View  Result Date: 07/23/2022 CLINICAL DATA:  Chest pain and shortness of breath EXAM: PORTABLE CHEST 1 VIEW .  Patient is rotated COMPARISON:  Chest x-ray 02/09/2022 FINDINGS: The heart and mediastinal contours are within normal limits. No focal consolidation. No pulmonary edema. No pleural effusion. No pneumothorax. No acute osseous abnormality. IMPRESSION: No active disease. Electronically Signed   By: Iven Finn M.D.   On: 07/23/2022 21:38     Assessment and Plan:   [name] is a 41 y.o. female with a hx of DM2, ESRD, severe PVD s/p b/l AKAs who is being seen 07/23/2022 for the evaluation of chest pain.   Atypical chest pain with ST changes not meeting STEMI criteria - now chest pain improved. She looked very comfortable. Evaluated by STEMI attending who did not feel emergent cath was indicated. Initial trop was 27 near baseline. She was actually intermittently screaming in pain of her sacrum or "butt". She has many risk factors for ASCVD and will treat as ACS.  Hypertension Severe PVD DM2 ESRD  - Plavix load - Heparin - Nitro gtt for chest pain and BP - Admit to hospitalist  - Trend trops  - Cycle ECGs for chest pain, notify us with ECG changes  - ECHO in am  - Keep NPO for Ischemic eval - Remainder per hospitalist   Risk Assessment/Risk Scores:     TIMI Risk Score for Unstable Angina or Non-ST Elevation MI:   The patient's TIMI risk score is 4, which indicates a 20% risk of all cause mortality, new or recurrent myocardial infarction or need for urgent revascularization in the next 14 days.        For questions or updates, please contact [place] Please consult [email or web address] for contact info under    Signed, Margurite Auerbach, MD  07/23/2022 10:26 PM

## 2022-07-23 NOTE — Assessment & Plan Note (Addendum)
Continue lipitor 40 Check lipid panel in AM

## 2022-07-23 NOTE — Assessment & Plan Note (Signed)
Wound care consult, off load as able.

## 2022-07-23 NOTE — Assessment & Plan Note (Addendum)
CP due to ACS vs HTN urgency -> per cards "Atypical chest pain with ST changes not meeting STEMI criteria" See cards and interventional cards consult notes in chart. ACS pathway NTG GTT Tele monitor Heparin gtt ASA Plavix load x1 NPO for ischemic work up 2d echo

## 2022-07-24 ENCOUNTER — Observation Stay (HOSPITAL_COMMUNITY): Payer: Medicare Other

## 2022-07-24 DIAGNOSIS — R079 Chest pain, unspecified: Secondary | ICD-10-CM

## 2022-07-24 DIAGNOSIS — Z880 Allergy status to penicillin: Secondary | ICD-10-CM | POA: Diagnosis not present

## 2022-07-24 DIAGNOSIS — I214 Non-ST elevation (NSTEMI) myocardial infarction: Secondary | ICD-10-CM | POA: Diagnosis present

## 2022-07-24 DIAGNOSIS — E1151 Type 2 diabetes mellitus with diabetic peripheral angiopathy without gangrene: Secondary | ICD-10-CM | POA: Diagnosis present

## 2022-07-24 DIAGNOSIS — N186 End stage renal disease: Secondary | ICD-10-CM | POA: Diagnosis present

## 2022-07-24 DIAGNOSIS — Z885 Allergy status to narcotic agent status: Secondary | ICD-10-CM | POA: Diagnosis not present

## 2022-07-24 DIAGNOSIS — Z91010 Allergy to peanuts: Secondary | ICD-10-CM | POA: Diagnosis not present

## 2022-07-24 DIAGNOSIS — L89152 Pressure ulcer of sacral region, stage 2: Secondary | ICD-10-CM | POA: Diagnosis present

## 2022-07-24 DIAGNOSIS — Z89611 Acquired absence of right leg above knee: Secondary | ICD-10-CM | POA: Diagnosis not present

## 2022-07-24 DIAGNOSIS — Z91018 Allergy to other foods: Secondary | ICD-10-CM | POA: Diagnosis not present

## 2022-07-24 DIAGNOSIS — E1122 Type 2 diabetes mellitus with diabetic chronic kidney disease: Secondary | ICD-10-CM | POA: Diagnosis present

## 2022-07-24 DIAGNOSIS — Z833 Family history of diabetes mellitus: Secondary | ICD-10-CM | POA: Diagnosis not present

## 2022-07-24 DIAGNOSIS — I25119 Atherosclerotic heart disease of native coronary artery with unspecified angina pectoris: Secondary | ICD-10-CM | POA: Diagnosis not present

## 2022-07-24 DIAGNOSIS — Z794 Long term (current) use of insulin: Secondary | ICD-10-CM | POA: Diagnosis not present

## 2022-07-24 DIAGNOSIS — Z89612 Acquired absence of left leg above knee: Secondary | ICD-10-CM | POA: Diagnosis not present

## 2022-07-24 DIAGNOSIS — I2510 Atherosclerotic heart disease of native coronary artery without angina pectoris: Secondary | ICD-10-CM | POA: Diagnosis present

## 2022-07-24 DIAGNOSIS — I120 Hypertensive chronic kidney disease with stage 5 chronic kidney disease or end stage renal disease: Secondary | ICD-10-CM | POA: Diagnosis present

## 2022-07-24 DIAGNOSIS — Z8673 Personal history of transient ischemic attack (TIA), and cerebral infarction without residual deficits: Secondary | ICD-10-CM | POA: Diagnosis not present

## 2022-07-24 DIAGNOSIS — H5461 Unqualified visual loss, right eye, normal vision left eye: Secondary | ICD-10-CM | POA: Diagnosis present

## 2022-07-24 DIAGNOSIS — D631 Anemia in chronic kidney disease: Secondary | ICD-10-CM | POA: Diagnosis present

## 2022-07-24 DIAGNOSIS — E113599 Type 2 diabetes mellitus with proliferative diabetic retinopathy without macular edema, unspecified eye: Secondary | ICD-10-CM | POA: Diagnosis present

## 2022-07-24 DIAGNOSIS — Z79899 Other long term (current) drug therapy: Secondary | ICD-10-CM | POA: Diagnosis not present

## 2022-07-24 DIAGNOSIS — Z992 Dependence on renal dialysis: Secondary | ICD-10-CM | POA: Diagnosis not present

## 2022-07-24 DIAGNOSIS — E785 Hyperlipidemia, unspecified: Secondary | ICD-10-CM | POA: Diagnosis present

## 2022-07-24 DIAGNOSIS — I161 Hypertensive emergency: Secondary | ICD-10-CM | POA: Diagnosis present

## 2022-07-24 DIAGNOSIS — M898X9 Other specified disorders of bone, unspecified site: Secondary | ICD-10-CM | POA: Diagnosis present

## 2022-07-24 LAB — BASIC METABOLIC PANEL
Anion gap: 12 (ref 5–15)
BUN: 25 mg/dL — ABNORMAL HIGH (ref 6–20)
CO2: 28 mmol/L (ref 22–32)
Calcium: 9 mg/dL (ref 8.9–10.3)
Chloride: 99 mmol/L (ref 98–111)
Creatinine, Ser: 4.66 mg/dL — ABNORMAL HIGH (ref 0.44–1.00)
GFR, Estimated: 12 mL/min — ABNORMAL LOW (ref >60)
Glucose, Bld: 319 mg/dL — ABNORMAL HIGH (ref 70–99)
Potassium: 5.1 mmol/L (ref 3.5–5.1)
Sodium: 139 mmol/L (ref 135–145)

## 2022-07-24 LAB — CBC
HCT: 34.9 % — ABNORMAL LOW (ref 36.0–46.0)
Hemoglobin: 10.5 g/dL — ABNORMAL LOW (ref 12.0–15.0)
MCH: 26.1 pg (ref 26.0–34.0)
MCHC: 30.1 g/dL (ref 30.0–36.0)
MCV: 86.6 fL (ref 80.0–100.0)
Platelets: 215 10*3/uL (ref 150–400)
RBC: 4.03 MIL/uL (ref 3.87–5.11)
RDW: 21 % — ABNORMAL HIGH (ref 11.5–15.5)
WBC: 7.3 10*3/uL (ref 4.0–10.5)
nRBC: 0.3 % — ABNORMAL HIGH (ref 0.0–0.2)

## 2022-07-24 LAB — CBG MONITORING, ED
Glucose-Capillary: 326 mg/dL — ABNORMAL HIGH (ref 70–99)
Glucose-Capillary: 217 mg/dL — ABNORMAL HIGH (ref 70–99)
Glucose-Capillary: 60 mg/dL — ABNORMAL LOW (ref 70–99)
Glucose-Capillary: 152 mg/dL — ABNORMAL HIGH (ref 70–99)
Glucose-Capillary: 201 mg/dL — ABNORMAL HIGH (ref 70–99)
Glucose-Capillary: 175 mg/dL — ABNORMAL HIGH (ref 70–99)
Glucose-Capillary: 143 mg/dL — ABNORMAL HIGH (ref 70–99)

## 2022-07-24 LAB — HEPARIN LEVEL (UNFRACTIONATED)
Heparin Unfractionated: 0.13 IU/mL — ABNORMAL LOW (ref 0.30–0.70)
Heparin Unfractionated: >1.1 IU/mL — ABNORMAL HIGH (ref 0.30–0.70)
Heparin Unfractionated: 0.92 IU/mL — ABNORMAL HIGH (ref 0.30–0.70)

## 2022-07-24 LAB — ECHOCARDIOGRAM COMPLETE
Area-P 1/2: 3.74 cm2
Height: 61 in
MV M vel: 0.66 m/s
MV Peak grad: 1.7 mmHg
S' Lateral: 2 cm
Weight: 1696.66 oz

## 2022-07-24 LAB — LIPID PANEL
Cholesterol: 148 mg/dL (ref 0–200)
HDL: 50 mg/dL (ref >40)
LDL Cholesterol: 72 mg/dL (ref 0–99)
Total CHOL/HDL Ratio: 3 RATIO
Triglycerides: 130 mg/dL (ref <150)
VLDL: 26 mg/dL (ref 0–40)

## 2022-07-24 LAB — HEPATITIS B SURFACE ANTIGEN: Hepatitis B Surface Ag: NONREACTIVE

## 2022-07-24 LAB — TROPONIN I (HIGH SENSITIVITY): Troponin I (High Sensitivity): 5860 ng/L (ref <18)

## 2022-07-24 MED ORDER — CHLORHEXIDINE GLUCONATE CLOTH 2 % EX PADS
6.0000 | MEDICATED_PAD | Freq: Every day | CUTANEOUS | Status: DC
  Administered 2022-07-26: 6 via TOPICAL

## 2022-07-24 MED ORDER — CINACALCET HCL 30 MG PO TABS
90.0000 mg | ORAL_TABLET | Freq: Every day | ORAL | Status: DC
  Administered 2022-07-24 – 2022-07-27 (×4): 90 mg via ORAL
  Filled 2022-07-24 (×5): qty 3

## 2022-07-24 MED ORDER — NITROGLYCERIN IN D5W 200-5 MCG/ML-% IV SOLN
0.0000 ug/min | INTRAVENOUS | Status: DC
  Administered 2022-07-24: 20 ug/min via INTRAVENOUS
  Administered 2022-07-25 – 2022-07-26 (×2): 25 ug/min via INTRAVENOUS
  Filled 2022-07-24: qty 250

## 2022-07-24 MED ORDER — DARBEPOETIN ALFA 200 MCG/0.4ML IJ SOSY
200.0000 ug | PREFILLED_SYRINGE | INTRAMUSCULAR | Status: DC
  Administered 2022-07-27: 200 ug via SUBCUTANEOUS
  Filled 2022-07-24: qty 0.4

## 2022-07-24 MED ORDER — OXYCODONE-ACETAMINOPHEN 5-325 MG PO TABS
1.0000 | ORAL_TABLET | Freq: Four times a day (QID) | ORAL | Status: DC | PRN
  Administered 2022-07-24 – 2022-07-25 (×5): 1 via ORAL
  Filled 2022-07-24 (×5): qty 1

## 2022-07-24 MED ORDER — HEPARIN BOLUS VIA INFUSION
2000.0000 [IU] | Freq: Once | INTRAVENOUS | Status: AC
  Administered 2022-07-24: 2000 [IU] via INTRAVENOUS
  Filled 2022-07-24: qty 2000

## 2022-07-24 NOTE — Progress Notes (Signed)
Rounding Note    Patient Name: [name] Date of Encounter: 07/24/2022  [place] Cardiologist: None   Subjective   No chest pain today. She has no complaints. She wants to go home  Inpatient Medications    Scheduled Meds:  aspirin  324 mg Oral Once   aspirin EC  81 mg Oral Daily   atorvastatin  40 mg Per Tube Daily   insulin aspart  0-6 Units Subcutaneous Q4H   insulin glargine-yfgn  5 Units Subcutaneous BID   Continuous Infusions:  heparin 800 Units/hr (07/24/22 0440)   nitroGLYCERIN 20 mcg/min (07/24/22 0807)   PRN Meds: acetaminophen, ondansetron (ZOFRAN) IV, oxyCODONE-acetaminophen   Vital Signs    Vitals:   07/24/22 0630 07/24/22 0645 07/24/22 0756 07/24/22 0845  BP: (!) 149/85 (!) 152/84 (!) 161/84 135/74  Pulse: 89 88 86 85  Resp: 10 16 19 15   Temp:   98.7 F (37.1 C)   TempSrc:   Oral   SpO2: 96% 99% 100% 99%  Weight:      Height:       No intake or output data in the 24 hours ending 07/24/22 0923    07/23/2022    9:00 PM 07/23/2022    8:00 PM 07/04/2022    5:00 AM  Last 3 Weights  Weight (lbs) 106 lb 0.7 oz 106 lb 106 lb 0.7 oz  Weight (kg) 48.1 kg 48.081 kg 48.1 kg      Telemetry    Sinus - Personally Reviewed  ECG    Sinus with subtle inferior J point elevation not c/w ischemia - Personally Reviewed  Physical Exam   GEN: No acute distress.   Neck: No JVD Cardiac: RRR, no murmurs, rubs, or gallops.  Respiratory: Clear to auscultation bilaterally. GI: Soft, nontender, non-distended  MS: Bilateral AKA Neuro:  Nonfocal  Psych: Normal affect   Labs    High Sensitivity Troponin:   Recent Labs  Lab 07/23/22 2120  TROPONINIHS 27*     Chemistry Recent Labs  Lab 07/23/22 2120 07/24/22 0310  NA 137 139  K 4.9 5.1  CL 97* 99  CO2 28 28  GLUCOSE 271* 319*  BUN 18 25*  CREATININE 4.17* 4.66*  CALCIUM 8.9 9.0  GFRNONAA 13* 12*  ANIONGAP 12 12    Lipids  Recent Labs  Lab 07/24/22 0310  CHOL 148  TRIG  130  HDL 50  LDLCALC 72  CHOLHDL 3.0    Hematology Recent Labs  Lab 07/23/22 2120 07/24/22 0310  WBC 6.6 7.3  RBC 4.20 4.03  HGB 10.9* 10.5*  HCT 36.2 34.9*  MCV 86.2 86.6  MCH 26.0 26.1  MCHC 30.1 30.1  RDW 21.2* 21.0*  PLT 226 215   Thyroid No results for input(s): "TSH", "FREET4" in the last 168 hours.  BNPNo results for input(s): "BNP", "PROBNP" in the last 168 hours.  DDimer No results for input(s): "DDIMER" in the last 168 hours.   Radiology    DG Chest Portable 1 View  Result Date: 07/23/2022 CLINICAL DATA:  Chest pain and shortness of breath EXAM: PORTABLE CHEST 1 VIEW .  Patient is rotated COMPARISON:  Chest x-ray 02/09/2022 FINDINGS: The heart and mediastinal contours are within normal limits. No focal consolidation. No pulmonary edema. No pleural effusion. No pneumothorax. No acute osseous abnormality. IMPRESSION: No active disease. Electronically Signed   By: Iven Finn M.D.   On: 07/23/2022 21:38    Cardiac Studies   Echo pending  Patient  Profile     41 y.o. female with history of ESRD on HD, Severe PAD s/p bilateral AKA, DM admitted with chest pain and abnormal EKG. Subtle inferior ST elevation on first EKG. Codc STEMI cancelled by Dr. Tamala Julian. Her chest pain resolved by the time she arrived in the ED  Assessment & Plan    Chest pain: Her chest pain has not recurred. Her EKG does not show ischemic changes today. Her BP was significantly elevated last night but is better today. Troponin x 1 at her baseline (27).  -Continue Plavix, heparin, NTG gtt.  -Echo today  If her LV function is normal and there is no significant rise in her troponin, she could be discharged home later today.   For questions or updates, please contact [place] Please consult [email or web address] for contact info under        Signed, Lauree Chandler, MD , [facility] 07/24/2022, 9:23 AM

## 2022-07-24 NOTE — ED Notes (Addendum)
Unable to draw labs at this time. Phlebotomist attempted with no success. Labs were drawn from pt IV. MD notified that it could have affected troponin result. MD states

## 2022-07-24 NOTE — ED Notes (Signed)
Patient provided iced water at this time and tolerates PO

## 2022-07-24 NOTE — ED Notes (Signed)
Lunch tray ordered 

## 2022-07-24 NOTE — Progress Notes (Signed)
ANTICOAGULATION CONSULT NOTE - Follow Up Consult  Pharmacy Consult for heparin Indication: chest pain/ACS  Labs: Recent Labs    07/23/22 2120 07/24/22 0310  HGB 10.9* 10.5*  HCT 36.2 34.9*  PLT 226 215  HEPARINUNFRC  --  0.13*  CREATININE 4.17* 4.66*  TROPONINIHS 27*  --     Assessment: 41yo female subtherapeutic on heparin with initial dosing for ACS; no infusion issues or signs of bleeding per RN.  Goal of Therapy:  Heparin level 0.3-0.7 units/ml   Plan:  Will rebolus with heparin 2000 units and increase heparin infusion by 4 units/kg/hr to 800 units/hr and check level in 8 hours.    Wynona Neat, PharmD, BCPS  07/24/2022,3:51 AM

## 2022-07-24 NOTE — ED Notes (Signed)
Medication requested from pharmacy/ to verify medications. Patient repositioned and updated on plan of care. Call bell is in reach

## 2022-07-24 NOTE — ED Notes (Signed)
Per conversation with lab; patient's lab work is in process including hepatitis B and heparin level

## 2022-07-24 NOTE — Progress Notes (Signed)
  Echocardiogram 2D Echocardiogram has been performed.  [name] 07/24/2022, 12:06 PM

## 2022-07-24 NOTE — ED Notes (Signed)
Heparin level sent at this time Dr Cruzita Lederer made aware

## 2022-07-24 NOTE — ED Notes (Signed)
Consult MD at bedside.

## 2022-07-24 NOTE — Progress Notes (Signed)
PROGRESS NOTE  [name] QPR:[id number] DOB: 12/24/1980 DOA: 07/23/2022 PCP: [facility]   LOS: 0 days   Brief Narrative / Interim history: 41 year old female with history of ESRD on MWF dialysis, DM2, HTN, extensive history of atherosclerotic vascular disease including PAD, stroke, bilateral lower extremities AKA's comes in to the hospital with complaints of substernal chest pain that happened on Monday after regular HD.  She was able to complete dialysis and chest pain started  right afterwards.  EKG done on the way to the ER was concerning for STEMI, code STEMI was activated but was cleared by cardiology and admitted to the hospitalist service with cardiology consultation.  Subjective / 24h Interval events: She denies any chest pain this morning, denies any shortness of breath.  She is on nitroglycerin and heparin infusion.  Assesement and Plan: Principal Problem:   Chest pain due to CAD [facility]) Active Problems:   Hypertensive urgency   End stage renal disease ([place])   HLD (hyperlipidemia)   Type 2 diabetes mellitus with proliferative retinopathy, with long-term current use of insulin (HCC)   Blindness of right eye   Sacral decubitus ulcer, stage II ([place])   NSTEMI (non-ST elevated myocardial infarction) ([place])   Principal problem Non-STEMI -patient was admitted to the hospital with chest pain, EKG changes concerning for STEMI, and cardiology was consulted.  She was placed on heparin infusion.  High-sensitivity troponin last night was 27, and repeat today is elevated to almost 6000.  Appreciate cardiology follow-up, looks like she may need ischemic evaluation with a cardiac cath.  Case discussed in the afternoon with Dr. Debara Pickett  Active problems Hypertensive emergency-she was on nitroglycerin infusion overnight, blood pressure and chest pain resolved this morning, but upon discontinuation of the nitroglycerin infusion her blood pressure increased again into  the 993T systolic.  She has a history of hypotension and is on midodrine which is now on hold.  ESRD-nephrology consulted  Sacral decubitus ulcer, stage II-wound care consult  Right eye blindness-at baseline  Hyperlipidemia-continue statin  DM2, with hyperglycemia-continue glargine and sliding scale  Lab Results  Component Value Date   HGBA1C 5.1 06/28/2022   CBG (last 3)  Recent Labs    07/24/22 0308 07/24/22 0753 07/24/22 1155  GLUCAP 326* 217* 60*    Scheduled Meds:  aspirin  324 mg Oral Once   aspirin EC  81 mg Oral Daily   atorvastatin  40 mg Per Tube Daily   insulin aspart  0-6 Units Subcutaneous Q4H   insulin glargine-yfgn  5 Units Subcutaneous BID   Continuous Infusions:  heparin 800 Units/hr (07/24/22 0440)   nitroGLYCERIN     PRN Meds:.acetaminophen, ondansetron (ZOFRAN) IV, oxyCODONE-acetaminophen  Current Outpatient Medications  Medication Instructions   Accu-Chek FastClix Lancets MISC Use as instructed to check blood sugar up to TID. E11.22   atorvastatin (LIPITOR) 40 mg, Per Tube, Daily   Blood Glucose Monitoring Suppl (ACCU-CHEK GUIDE ME) w/Device KIT 1 kit, Does not apply, 3 times daily, Use to check BG at home up to 3 times daily. E11.22   cinacalcet (SENSIPAR) 90 mg, Oral, Daily   clindamycin (CLEOCIN) 300 mg, Oral, Every 12 hours   Continuous Blood Gluc Transmit (DEXCOM G6 TRANSMITTER) MISC 1 Device, Does not apply, As directed   diphenhydrAMINE (BENADRYL) 25 mg, Oral,  Once   docusate (COLACE) 100 mg, Oral, 2 times daily   dorzolamide-timolol (COSOPT) 22.3-6.8 MG/ML ophthalmic solution 1 drop, Left Eye, 2 times daily   fluconazole (DIFLUCAN)  150 mg, Oral, Every M-W-F, 1 tablet by mouth every Monday, Wednesday, and Friday for 3 administrations.   Glucagon Emergency 1 mg, Intramuscular,  Once, If BS drops below 60    glucose blood (ACCU-CHEK GUIDE) test strip Use as instructed to check blood sugar up to TID. E11.22   insulin aspart (NOVOLOG) 0-6  Units, Subcutaneous, Every 4 hours   insulin glargine (SEMGLEE) 30 Units, Subcutaneous, Daily-1800   Insulin Pen Needle 32G X 4 MM MISC 1 Device, Does not apply, As directed   midodrine (PROAMATINE) 5 mg, Oral, 3 times daily   Multiple Vitamin (MULTIVITAMIN WITH MINERALS) TABS tablet 1 tablet, Oral, Daily   NON FORMULARY 30 mLs, Per Tube, Daily, ProHeal   Nutritional Supplements (NUTRITIONAL DRINK) LIQD 120 mLs, Oral, Daily   nystatin (MYCOSTATIN) 500,000 Units, Oral, 4 times daily   ondansetron (ZOFRAN) 4 mg, Oral, Every 6 hours PRN   oxyCODONE-acetaminophen (PERCOCET) 5-325 MG tablet 1 tablet, Oral, Every 8 hours PRN   polyethylene glycol (MIRALAX / GLYCOLAX) 17 g, Oral, Daily-1800   predniSONE (DELTASONE) 40 mg, Oral,  Once   Probiotic, Lactobacillus, CAPS 1 capsule, Oral, 2 times daily   sodium chloride flush (NS) 0.9 % SOLN 10 mLs, Daily at bedtime   thiamine (VITAMIN B1) 100 mg, Per Tube, Daily    Diet Orders (From admission, onward)     Start     Ordered   07/24/22 1219  Diet renal with fluid restriction Fluid restriction: 1200 mL Fluid; Room service appropriate? Yes; Fluid consistency: Thin  Diet effective now       Question Answer Comment  Fluid restriction: 1200 mL Fluid   Room service appropriate? Yes   Fluid consistency: Thin      07/24/22 1218            DVT prophylaxis:    Lab Results  Component Value Date   PLT 215 07/24/2022      Code Status: Full Code  Family Communication: no family at bedside  Status is: Inpatient  Level of care: Progressive  Consultants:  Cardiology Nephrology   Objective: Vitals:   07/24/22 1000 07/24/22 1045 07/24/22 1208 07/24/22 1220  BP: (!) 162/83 (!) 141/76  121/66  Pulse: 87 84  92  Resp: _0 Temp:   98 F (36.7 C)   TempSrc:   Oral   SpO2: 98% 99%  99%  Weight:      Height:        Intake/Output Summary (Last 24 hours) at 07/24/2022 1401 Last data filed at 07/24/2022 1206 Gross per 24 hour   Intake 71.29 ml  Output --  Net 71.29 ml   Wt Readings from Last 3 Encounters:  07/23/22 48.1 kg  07/04/22 48.1 kg  04/06/22 59.4 kg    Examination:  Constitutional: NAD Eyes: no scleral icterus ENMT: Mucous membranes are moist.  Neck: normal, supple Respiratory: clear to auscultation bilaterally, no wheezing, no crackles. Normal respiratory effort.  Cardiovascular: Regular rate and rhythm, no murmurs / rubs / gallops.  Abdomen: non distended, no tenderness. Bowel sounds positive.  Musculoskeletal: no clubbing / cyanosis.  Neurologic: non focal  Data Reviewed: I have independently reviewed following labs and imaging studies   CBC Recent Labs  Lab 07/23/22 2120 07/24/22 0310  WBC 6.6 7.3  HGB 10.9* 10.5*  HCT 36.2 34.9*  PLT 226 215  MCV 86.2 86.6  MCH 26.0 26.1  MCHC 30.1 30.1  RDW 21.2* 21.0*    Recent Labs  Lab 07/23/22 2120 07/24/22 0310  NA 137 139  K 4.9 5.1  CL 97* 99  CO2 28 28  GLUCOSE 271* 319*  BUN 18 25*  CREATININE 4.17* 4.66*  CALCIUM 8.9 9.0    ------------------------------------------------------------------------------------------------------------------ Recent Labs    07/24/22 0310  CHOL 148  HDL 50  LDLCALC 72  TRIG 130  CHOLHDL 3.0    Lab Results  Component Value Date   HGBA1C 5.1 06/28/2022   ------------------------------------------------------------------------------------------------------------------ No results for input(s): "TSH", "T4TOTAL", "T3FREE", "THYROIDAB" in the last 72 hours.  Invalid input(s): "FREET3"  Cardiac Enzymes No results for input(s): "CKMB", "TROPONINI", "MYOGLOBIN" in the last 168 hours.  Invalid input(s): "CK" ------------------------------------------------------------------------------------------------------------------ No results found for: "BNP"  CBG: Recent Labs  Lab 07/24/22 0308 07/24/22 0753 07/24/22 1155  GLUCAP 326* 217* 60*    No results found for this or any  previous visit (from the past 240 hour(s)).   Radiology Studies: ECHOCARDIOGRAM COMPLETE  Result Date: 07/24/2022    ECHOCARDIOGRAM REPORT   Patient Name:   [name] Date of Exam: 07/24/2022 Medical Rec #:  [id number]     Height:       61.0 in Accession #:    [id number]    Weight:       106.0 lb Date of Birth:  19-Dec-1980      BSA:          1.442 m Patient Age:    43 years      BP:           141/76 mmHg Patient Gender: F             HR:           83 bpm. Exam Location:  Inpatient Procedure: 2D Echo, 3D Echo, Cardiac Doppler and Color Doppler                                MODIFIED REPORT: This report was modified by Rudean Haskell MD on 07/24/2022 due to [place].  Indications:     Chest Pain R07.9  History:         Patient has prior history of Echocardiogram examinations, most                  recent 10/02/2021. CAD, Stroke, Signs/Symptoms:Chest Pain; Risk                  Factors:Diabetes, Dyslipidemia, Hypertension and Non-Smoker.  Sonographer:     Greer Pickerel Referring Phys:  [id number] [employer] [place] Diagnosing Phys: Rudean Haskell MD  Sonographer Comments: Image acquisition challenging due to patient body habitus and Image acquisition challenging due to respiratory motion. Patient not able to adjust due to pain. IMPRESSIONS  1. Left ventricular ejection fraction, by estimation, is 60 to 65%. The left ventricle has normal function. The left ventricle has no regional wall motion abnormalities. There is mild concentric left ventricular hypertrophy. Left ventricular diastolic parameters are consistent with Grade II diastolic dysfunction (pseudonormalization).  2. Right ventricular systolic function is normal. The right ventricular size is normal.  3. The mitral valve is abnormal. No evidence of mitral valve regurgitation. No evidence of mitral stenosis.  4. The aortic valve was not well visualized. There is moderate calcification of the aortic valve. Aortic valve regurgitation is not visualized. No  aortic stenosis is present.  5. The inferior vena cava is normal in size with greater than 50% respiratory variability, suggesting  right atrial pressure of 3 mmHg. FINDINGS  Left Ventricle: Left ventricular ejection fraction, by estimation, is 60 to 65%. The left ventricle has normal function. The left ventricle has no regional wall motion abnormalities. 3D left ventricular ejection fraction analysis performed but not reported based on interpreter judgement due to suboptimal tracking. The left ventricular internal cavity size was small. There is mild concentric left ventricular hypertrophy. Left ventricular diastolic parameters are consistent with Grade II diastolic dysfunction (pseudonormalization). Right Ventricle: The right ventricular size is normal. No increase in right ventricular wall thickness. Right ventricular systolic function is normal. Left Atrium: Left atrial size was normal in size. Right Atrium: Right atrial size was normal in size. Pericardium: There is no evidence of pericardial effusion. Mitral Valve: The mitral valve is abnormal. No evidence of mitral valve regurgitation. No evidence of mitral valve stenosis. Tricuspid Valve: The tricuspid valve is normal in structure. Tricuspid valve regurgitation is not demonstrated. Aortic Valve: The aortic valve was not well visualized. There is moderate calcification of the aortic valve. Aortic valve regurgitation is not visualized. No aortic stenosis is present. Pulmonic Valve: The pulmonic valve was not well visualized. Pulmonic valve regurgitation is not visualized. Aorta: The aortic root is normal in size and structure. Venous: The inferior vena cava is normal in size with greater than 50% respiratory variability, suggesting right atrial pressure of 3 mmHg. IAS/Shunts: No atrial level shunt detected by color flow Doppler.  LEFT VENTRICLE PLAX 2D LVIDd:         3.70 cm   Diastology LVIDs:         2.00 cm   LV e' medial:    4.91 cm/s LV PW:         0.90 cm    LV E/e' medial:  20.6 LV IVS:        1.00 cm   LV e' lateral:   5.34 cm/s LVOT diam:     1.70 cm   LV E/e' lateral: 18.9 LV SV:         37 LV SV Index:   26 LVOT Area:     2.27 cm                           3D Volume EF:                          3D EF:        50 %                          LV EDV:       110 ml                          LV ESV:       55 ml                          LV SV:        55 ml RIGHT VENTRICLE RV S prime:     9.11 cm/s TAPSE (M-mode): 1.6 cm LEFT ATRIUM           Index        RIGHT ATRIUM           Index LA diam:      2.50 cm 1.73 cm/m   RA Area:  10.90 cm LA Vol (A2C): 29.2 ml 20.24 ml/m  RA Volume:   20.30 ml  14.07 ml/m LA Vol (A4C): 41.9 ml 29.05 ml/m  AORTIC VALVE LVOT Vmax:   84.90 cm/s LVOT Vmean:  59.600 cm/s LVOT VTI:    0.165 m  AORTA Ao Root diam: 3.10 cm MITRAL VALVE MV Area (PHT): 3.74 cm     SHUNTS MV Decel Time: 203 msec     Systemic VTI:  0.16 m MR Peak grad: 1.7 mmHg      Systemic Diam: 1.70 cm MR Vmax:      65.90 cm/s MV E velocity: 101.00 cm/s MV A velocity: 73.30 cm/s MV E/A ratio:  1.38 Rudean Haskell MD Electronically signed by Rudean Haskell MD Signature Date/Time: 07/24/2022/12:54:44 PM    Final (Updated)    DG Chest Portable 1 View  Result Date: 07/23/2022 CLINICAL DATA:  Chest pain and shortness of breath EXAM: PORTABLE CHEST 1 VIEW .  Patient is rotated COMPARISON:  Chest x-ray 02/09/2022 FINDINGS: The heart and mediastinal contours are within normal limits. No focal consolidation. No pulmonary edema. No pleural effusion. No pneumothorax. No acute osseous abnormality. IMPRESSION: No active disease. Electronically Signed   By: Iven Finn M.D.   On: 07/23/2022 21:38     Marzetta Board, MD, PhD [facility]  Between 7 am - 7 pm I am available, please contact me via Amion (for emergencies) or Securechat (non urgent messages)  Between 7 pm - 7 am I am not available, please contact night coverage MD/APP via Amion

## 2022-07-24 NOTE — Consult Note (Signed)
Renal Service Consult Note [facility]  [name] 07/24/2022 [name], MD Requesting Physician: Dr. Cruzita Lederer  Reason for Consult: ESRD pt w/ nstemi HPI: The patient is a 41 y.o. year-old w/ PMH as below who presented to ED last night from SNF/ rehab place for chest pain. Pt had mid sternal CP w/ some SOB, no n/v. Pt had HD yesterday and finished about 2 pm. CP onset was after HD and progressed thru the day. In ED ECK showed inferior ST^ and code STEMI was called. Pt was started on IV ntg and heparin, asa and plavix load. ECHO was ordered. Pt was admitted. We are asked to see for dialysis.   Pt seen in ED.  CP better. Notes she had L AKA not too long ago in November, still tender to touch. No SOB or swelling. Lives at [facility] for now.   ROS - denies CP, no joint pain, no HA, no blurry vision, no rash, no diarrhea, no nausea/ vomiting, no dysuria, no difficulty voiding   Past Medical History  Past Medical History:  Diagnosis Date   Anemia    Anxiety    Blind right eye 2008   Diabetes mellitus without complication ([place])    Embolic stroke ([place])    ESRD (end stage renal disease) ([place])    HD on M/W/F   ESRD on hemodialysis ([place])    GBS (Guillain Barre syndrome) ([place])    Past Surgical History  Past Surgical History:  Procedure Laterality Date   AMPUTATION Right 08/11/2021   Procedure: TRANSMETATARSAL AMPUTATION OF RIGHT FOOT;  Surgeon: Serafina Mitchell, MD;  Location: [place];  Service: Vascular;  Laterality: Right;   AMPUTATION Right 09/17/2021   Procedure: AMPUTATION RIGHT BELOW KNEE;  Surgeon: Serafina Mitchell, MD;  Location: [place];  Service: Vascular;  Laterality: Right;   AMPUTATION Right 10/11/2021   Procedure: RIGHT ABOVE KNEE AMPUTATION;  Surgeon: Serafina Mitchell, MD;  Location: [place];  Service: Vascular;  Laterality: Right;   AMPUTATION Left 07/02/2022   Procedure: LEFT ABOVE KNEE AMPUTATION;  Surgeon: Angelia Mould, MD;  Location: [place];  Service:  Vascular;  Laterality: Left;   AMPUTATION TOE Left    APPLICATION OF WOUND VAC  08/16/2021   Procedure: APPLICATION OF WOUND VAC;  Surgeon: Serafina Mitchell, MD;  Location: [place];  Service: Vascular;;   AV FISTULA PLACEMENT     BUBBLE STUDY  10/02/2021   Procedure: BUBBLE STUDY;  Surgeon: Fay Records, MD;  Location: [place];  Service: Cardiovascular;;   CESAREAN SECTION  2011   FISTULA SUPERFICIALIZATION Right 91/69/4503   Procedure: 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;  Surgeon: Angelia Mould, MD;  Location: [place];  Service: Vascular;  Laterality: Right;   INSERTION OF DIALYSIS CATHETER Left 07/27/2019   Procedure: INSERTION OF TUNNELED  DIALYSIS CATHETER;  Surgeon: Angelia Mould, MD;  Location: [place];  Service: Vascular;  Laterality: Left;   INSERTION OF DIALYSIS CATHETER Left 10/23/2021   Procedure: INSERTION OF TUNNELED PALINDROME PRECISION DIALYSIS CATHETER (23cm);  Surgeon: Marty Heck, MD;  Location: [facility] OR;  Service: Vascular;  Laterality: Left;   IR FLUORO GUIDE CV LINE LEFT  10/16/2021   IR FLUORO GUIDE CV LINE RIGHT  10/11/2021   IR GASTROSTOMY TUBE MOD SED  10/30/2021   IR GASTROSTOMY TUBE REMOVAL  05/08/2022   IR US GUIDE VASC ACCESS LEFT  10/16/2021   IR US GUIDE VASC ACCESS RIGHT  10/11/2021   LOWER EXTREMITY ANGIOGRAPHY N/A 08/11/2021   Procedure: LOWER EXTREMITY ANGIOGRAPHY;  Surgeon: Serafina Mitchell, MD;  Location: [place] CV LAB;  Service: Cardiovascular;  Laterality: N/A;   REVISON OF ARTERIOVENOUS FISTULA Right 10/23/2021   Procedure: REVISON OF ARTERIOVENOUS FISTULA  ARM AND PLICATION;  Surgeon: Marty Heck, MD;  Location: [place];  Service: Vascular;  Laterality: Right;   TEE WITHOUT CARDIOVERSION N/A 10/02/2021   Procedure: TRANSESOPHAGEAL ECHOCARDIOGRAM (TEE);  Surgeon: Fay Records, MD;  Location: [facility] ENDOSCOPY;  Service: Cardiovascular;  Laterality: N/A;   TRANSMETATARSAL AMPUTATION Right 08/16/2021   Procedure:  CLOSURE OF RIGHT TRANSMETATARSAL AMPUTATION;  Surgeon: Serafina Mitchell, MD;  Location: [facility] OR;  Service: Vascular;  Laterality: Right;   Family History  Family History  Problem Relation Age of Onset   Diabetes Mother    Diabetes Father    Social History  reports that she has never smoked. She has never used smokeless tobacco. She reports that she does not drink alcohol and does not use drugs. Allergies  Allergies  Allergen Reactions   Morphine Rash and Other (See Comments)   Peanut-Containing Drug Products Anaphylaxis and Hives   Penicillins Rash and Other (See Comments)    Rash in 2008.  Tolerated cefazolin in 2020   Chocolate Hives   Shellfish Allergy Swelling   Home medications Prior to Admission medications   Medication Sig Start Date End Date Taking? Authorizing Provider  cinacalcet (SENSIPAR) 90 MG tablet Take 90 mg by mouth daily. 09/22/19  Yes [provider]  clindamycin (CLEOCIN) 300 MG capsule Take 300 mg by mouth every 12 (twelve) hours. 07/19/22 07/26/22 Yes [provider]  diphenhydrAMINE (BENADRYL) 25 mg capsule Take 25 mg by mouth once.   Yes [provider]  docusate (COLACE) 50 MG/5ML liquid Take 100 mg by mouth 2 (two) times daily.   Yes [provider]  dorzolamide-timolol (COSOPT) 22.3-6.8 MG/ML ophthalmic solution Place 1 drop into the left eye 2 (two) times daily. 03/30/21  Yes [provider]  fluconazole (DIFLUCAN) 150 MG tablet Take 150 mg by mouth every Monday, Wednesday, and Friday. 1 tablet by mouth every Monday, Wednesday, and Friday for 3 administrations.   Yes [provider]  Glucagon, rDNA, (GLUCAGON EMERGENCY) 1 MG KIT Inject 1 mg into the muscle once. If BS drops below 60 06/10/22  Yes [provider]  insulin aspart (NOVOLOG) 100 UNIT/ML injection Inject 0-6 Units into the skin every 4 (four) hours. Patient taking differently: Inject 0-10 Units into the skin 3 (three) times daily with meals.  121-150=1 unit 152-200=2 units 201-250=3 units 251-300=5 units 301-350=7 units 351-400=10 units 10/25/21  Yes Adhikari, Amrit, MD  insulin glargine (SEMGLEE) 100 UNIT/ML injection Inject 30 Units into the skin daily at 6 PM.   Yes [provider]  midodrine (PROAMATINE) 5 MG tablet Take 5 mg by mouth 3 (three) times daily. 03/03/22  Yes [provider]  Multiple Vitamin (MULTIVITAMIN WITH MINERALS) TABS tablet Take 1 tablet by mouth daily.   Yes [provider]  NON FORMULARY Place 30 mLs into feeding tube daily. ProHeal   Yes [provider]  Nutritional Supplements (NUTRITIONAL DRINK) LIQD Take 120 mLs by mouth daily.   Yes [provider]  ondansetron (ZOFRAN) 4 MG tablet Take 4 mg by mouth every 6 (six) hours as needed for nausea/vomiting. 03/02/22  Yes [provider]  oxyCODONE-acetaminophen (PERCOCET) 5-325 MG tablet Take 1 tablet by mouth every 8 (eight)  hours as needed for severe pain.   Yes [provider]  polyethylene glycol (MIRALAX / GLYCOLAX) 17 g packet Take 17 g by mouth daily at 6 PM.   Yes [provider]  predniSONE (DELTASONE) 20 MG tablet Take 40 mg by mouth once.   Yes [provider]  Probiotic, Lactobacillus, CAPS Take 1 capsule by mouth in the morning and at bedtime.   Yes [provider]  thiamine 100 MG tablet Place 1 tablet (100 mg total) into feeding tube daily. 10/26/21  Yes Shelly Coss, MD  Accu-Chek FastClix Lancets MISC Use as instructed to check blood sugar up to TID. E11.22 Patient taking differently: 1 each by Other route See admin instructions. Use as instructed to check blood sugar up to TID. E11.22 04/15/19   Charlott Rakes, MD  atorvastatin (LIPITOR) 40 MG tablet Place 1 tablet (40 mg total) into feeding tube daily. Patient not taking: Reported on 07/24/2022 10/26/21   Shelly Coss, MD  Blood Glucose Monitoring Suppl (ACCU-CHEK GUIDE ME) w/Device KIT 1 kit by Does  not apply route 3 (three) times daily. Use to check BG at home up to 3 times daily. E11.22 04/15/19   Charlott Rakes, MD  Continuous Blood Gluc Transmit (DEXCOM G6 TRANSMITTER) MISC 1 Device by Does not apply route as directed. 09/12/20   Shamleffer, Melanie Crazier, MD  glucose blood (ACCU-CHEK GUIDE) test strip Use as instructed to check blood sugar up to TID. E11.22 Patient taking differently: 1 each by Other route See admin instructions. Use as instructed to check blood sugar up to TID. E11.22 07/17/19   Fulp, Cammie, MD  Insulin Pen Needle 32G X 4 MM MISC 1 Device by Does not apply route as directed. 05/13/20   Shamleffer, Melanie Crazier, MD  nystatin (MYCOSTATIN) 100000 UNIT/ML suspension Take 5 mLs (500,000 Units total) by mouth 4 (four) times daily. 10/25/21   Shelly Coss, MD  sodium chloride flush (NS) 0.9 % SOLN Inject 10 mLs into the vein at bedtime. Patient not taking: Reported on 07/24/2022    [provider]     Vitals:   07/24/22 1419 07/24/22 1445 07/24/22 1530 07/24/22 1545  BP:  (!) 141/77  (!) 163/85  Pulse:  86 88 85  Resp:  _0 Temp: 98 F (36.7 C)     TempSrc: Oral     SpO2:  99% 100% 100%  Weight:      Height:       Exam Gen alert, no distress No rash, cyanosis or gangrene Sclera anicteric, throat clear  No jvd or bruits Chest clear bilat to bases, no rales/ wheezing RRR no MRG Abd soft ntnd no mass or ascites +bs GU defer MS L AKA (dressed) and R AKA, no edema Neuro is alert, Ox 3 , nf    RUA AVF+bruit     Home meds include - asa, lipitor, plavix, insulin, prns/    OP HD: [place] MWF 3h 29mn  400/1.5  47.8kg  2/2 bath   Hep none  RUA AVF - last HD 12/11, post wt 48.1kg - mircera 225 mcg q2, last 12/1, due 12/15 - no vdra   Assessment/ Plan: Chest pain / NSTEMI - due to ACS vs HTN urgency, per pmd and cardiology. Getting IV ntg and heparin, sp ASA and plavix po.  BP/ volume - BP's better controlled today. No vol excess on exam.   ESRD - on HD MWF. Has not missed HD. HD tomorrow. 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 Taking sensipar 90 qd but no vdra or binder per OP records. Continue.       Rob Magdeline Prange  MD 07/24/2022, 3:50 PM Recent Labs  Lab 07/23/22 2120 07/24/22 0310  HGB 10.9* 10.5*  CALCIUM 8.9 9.0  CREATININE 4.17* 4.66*  K 4.9 5.1   Inpatient medications:  aspirin  324 mg Oral Once   aspirin EC  81 mg Oral Daily   atorvastatin  40 mg Per Tube Daily   insulin aspart  0-6 Units Subcutaneous Q4H   insulin glargine-yfgn  5 Units Subcutaneous BID    heparin 800 Units/hr (07/24/22 0440)   nitroGLYCERIN 20 mcg/min (07/24/22 1418)   acetaminophen, ondansetron (ZOFRAN) IV, oxyCODONE-acetaminophen

## 2022-07-24 NOTE — ED Notes (Signed)
This RN reaches out to pharmacy via RX button regarding heparin gtt

## 2022-07-24 NOTE — ED Notes (Signed)
Patient CBG was 60 the Nurse was informed, and patient was given a [place] sandwich bag with cup of orange juice.

## 2022-07-24 NOTE — ED Notes (Signed)
IV team at bedside at this time. 

## 2022-07-24 NOTE — Consult Note (Signed)
[facility] Nurse Consult Note: Reason for Consult:Stage 2 pressure injury to sacrum. Care for this lesion is directed by the standing skin care order set, but I will enter orders. Wound type:pressure Pressure Injury POA: Yes Measurement:To be measured today by Bedside RN with next dressing change Wound HID:UPBD, moist Drainage (amount, consistency, odor) scant serous Periwound:intact Dressing procedure/placement/frequency:Turning and repositioning is in place, but I have added guidance for minimizing time in the supine position. Topical care is to be a daily cleanse followed by covering the lesion with a size appropriate piece of antimicrobial nonadherent gauze (xeroform) and topping with a silicone foam dressing for the sacrum. Changes are to be daily and PRN soiling.  [place] nursing team will not follow, but will remain available to this patient, the nursing and medical teams.  Please re-consult if needed.  Thank you for inviting [place] to participate in this patient's Plan of Care.  Maudie Flakes, MSN, RN, CNS, [place], Serita Grammes, [employer], [employer] phone:  [phone]

## 2022-07-24 NOTE — ED Notes (Signed)
Currently eating [place] sandwich. Cleared to eat at this time.

## 2022-07-24 NOTE — ED Notes (Signed)
Sensipar medication requested from pharmacy at this time to be sent via tube station

## 2022-07-24 NOTE — Progress Notes (Signed)
ANTICOAGULATION CONSULT NOTE - Initial Consult  Pharmacy Consult for Heparin Infusion Indication: chest pain/ACS  Allergies  Allergen Reactions   Morphine Rash and Other (See Comments)   Peanut-Containing Drug Products Anaphylaxis and Hives   Penicillins Rash and Other (See Comments)    Rash in 2008.  Tolerated cefazolin in 2020   Chocolate Hives   Shellfish Allergy Swelling    Patient Measurements: Height: 5\' 1"  (154.9 cm) Weight: 48.1 kg (106 lb 0.7 oz) IBW/kg (Calculated) : 47.8 Heparin Dosing Weight: 48.1 kg  Vital Signs: Temp: 98.4 F (36.9 C) (12/12 1804) Temp Source: Oral (12/12 1419) BP: 175/86 (12/12 2215) Pulse Rate: 81 (12/12 2045)  Labs: Recent Labs    07/23/22 2120 07/24/22 0310 07/24/22 1200 07/24/22 1220 07/24/22 1816  HGB 10.9* 10.5*  --   --   --   HCT 36.2 34.9*  --   --   --   PLT 226 215  --   --   --   HEPARINUNFRC  --  0.13* >1.10*  --  0.92*  CREATININE 4.17* 4.66*  --   --   --   TROPONINIHS 27*  --   --  5,860*  --      Estimated Creatinine Clearance: 12.1 mL/min (A) (by C-G formula based on SCr of 4.66 mg/dL (H)).   Medical History: Past Medical History:  Diagnosis Date   Anemia    Anxiety    Blind right eye 2008   Diabetes mellitus without complication ([place])    Embolic stroke ([place])    ESRD (end stage renal disease) ([place])    HD on M/W/F   ESRD on hemodialysis ([place])    GBS (Guillain Barre syndrome) ([facility])     Medications:  (Not in a hospital admission)  Assessment: 41 yo F BIB EMS for Code STEMI. Pt received aspirin 324mg  x 1 with heparin 2900 units (~60 units/kg) IV bolus in ED. Code STEMI cancelled, determined to be NSTEMI. Pt is not taking anticoagulation prior to admission. Pharmacy consulted to dose heparin infusion per ACS protocol.   Heparin level 0.92, supratherapeutic Current heparin infusion rate: 800 units/hr  Hgb 10.5, Plt 215, stable  Goal of Therapy:  Heparin level 0.3-0.7 units/ml Monitor platelets by  anticoagulation protocol: Yes   Plan:  Decrease heparin infusion rate to 700 units/hr Check heparin level in 6 hours Monitor daily CBC, heparin level, and for s/sx of bleeding  Luisa Hart, PharmD, BCPS Clinical Pharmacist 07/24/2022 10:48 PM   Please refer to AMION for pharmacy phone number

## 2022-07-24 NOTE — ED Notes (Signed)
Dr Cruzita Lederer notified of the delay in care regarding blood work and IV team access issues

## 2022-07-25 DIAGNOSIS — I25119 Atherosclerotic heart disease of native coronary artery with unspecified angina pectoris: Secondary | ICD-10-CM | POA: Diagnosis not present

## 2022-07-25 DIAGNOSIS — I214 Non-ST elevation (NSTEMI) myocardial infarction: Secondary | ICD-10-CM

## 2022-07-25 LAB — CBC
HCT: 32.5 % — ABNORMAL LOW (ref 36.0–46.0)
Hemoglobin: 9.8 g/dL — ABNORMAL LOW (ref 12.0–15.0)
MCH: 25.7 pg — ABNORMAL LOW (ref 26.0–34.0)
MCHC: 30.2 g/dL (ref 30.0–36.0)
MCV: 85.3 fL (ref 80.0–100.0)
Platelets: 226 10*3/uL (ref 150–400)
RBC: 3.81 MIL/uL — ABNORMAL LOW (ref 3.87–5.11)
RDW: 20.4 % — ABNORMAL HIGH (ref 11.5–15.5)
WBC: 9.3 10*3/uL (ref 4.0–10.5)
nRBC: 0 % (ref 0.0–0.2)

## 2022-07-25 LAB — PHOSPHORUS: Phosphorus: 6.5 mg/dL — ABNORMAL HIGH (ref 2.5–4.6)

## 2022-07-25 LAB — COMPREHENSIVE METABOLIC PANEL
ALT: 15 U/L (ref 0–44)
AST: 114 U/L — ABNORMAL HIGH (ref 15–41)
Albumin: 2.3 g/dL — ABNORMAL LOW (ref 3.5–5.0)
Alkaline Phosphatase: 61 U/L (ref 38–126)
Anion gap: 11 (ref 5–15)
BUN: 41 mg/dL — ABNORMAL HIGH (ref 6–20)
CO2: 29 mmol/L (ref 22–32)
Calcium: 9.5 mg/dL (ref 8.9–10.3)
Chloride: 97 mmol/L — ABNORMAL LOW (ref 98–111)
Creatinine, Ser: 6.26 mg/dL — ABNORMAL HIGH (ref 0.44–1.00)
GFR, Estimated: 8 mL/min — ABNORMAL LOW (ref >60)
Glucose, Bld: 67 mg/dL — ABNORMAL LOW (ref 70–99)
Potassium: 5.7 mmol/L — ABNORMAL HIGH (ref 3.5–5.1)
Sodium: 137 mmol/L (ref 135–145)
Total Bilirubin: 0.4 mg/dL (ref 0.3–1.2)
Total Protein: 6 g/dL — ABNORMAL LOW (ref 6.5–8.1)

## 2022-07-25 LAB — GLUCOSE, CAPILLARY
Glucose-Capillary: 103 mg/dL — ABNORMAL HIGH (ref 70–99)
Glucose-Capillary: 137 mg/dL — ABNORMAL HIGH (ref 70–99)
Glucose-Capillary: 89 mg/dL (ref 70–99)
Glucose-Capillary: 97 mg/dL (ref 70–99)

## 2022-07-25 LAB — CBG MONITORING, ED
Glucose-Capillary: 121 mg/dL — ABNORMAL HIGH (ref 70–99)
Glucose-Capillary: 66 mg/dL — ABNORMAL LOW (ref 70–99)
Glucose-Capillary: 89 mg/dL (ref 70–99)

## 2022-07-25 LAB — MAGNESIUM: Magnesium: 2 mg/dL (ref 1.7–2.4)

## 2022-07-25 LAB — IRON AND TIBC
Iron: 56 ug/dL (ref 28–170)
Saturation Ratios: 31 % (ref 10.4–31.8)
TIBC: 179 ug/dL — ABNORMAL LOW (ref 250–450)
UIBC: 123 ug/dL

## 2022-07-25 LAB — HEPARIN LEVEL (UNFRACTIONATED)
Heparin Unfractionated: 0.12 IU/mL — ABNORMAL LOW (ref 0.30–0.70)
Heparin Unfractionated: 0.2 IU/mL — ABNORMAL LOW (ref 0.30–0.70)

## 2022-07-25 LAB — FERRITIN: Ferritin: 562 ng/mL — ABNORMAL HIGH (ref 11–307)

## 2022-07-25 MED ORDER — CLOPIDOGREL BISULFATE 75 MG PO TABS
75.0000 mg | ORAL_TABLET | Freq: Every day | ORAL | Status: DC
  Administered 2022-07-25 – 2022-07-27 (×3): 75 mg via ORAL
  Filled 2022-07-25 (×3): qty 1

## 2022-07-25 MED ORDER — SODIUM CHLORIDE 0.9% FLUSH
3.0000 mL | Freq: Two times a day (BID) | INTRAVENOUS | Status: DC
  Administered 2022-07-26 – 2022-07-27 (×3): 3 mL via INTRAVENOUS

## 2022-07-25 MED ORDER — INSULIN GLARGINE-YFGN 100 UNIT/ML ~~LOC~~ SOLN
5.0000 [IU] | Freq: Two times a day (BID) | SUBCUTANEOUS | Status: DC
  Filled 2022-07-25: qty 0.05

## 2022-07-25 MED ORDER — SODIUM CHLORIDE 0.9 % IV SOLN: 250.0000 mL | INTRAVENOUS | Status: DC | PRN

## 2022-07-25 MED ORDER — OXYCODONE-ACETAMINOPHEN 5-325 MG PO TABS
1.0000 | ORAL_TABLET | ORAL | Status: DC | PRN
  Administered 2022-07-25 – 2022-07-27 (×9): 1 via ORAL
  Filled 2022-07-25 (×9): qty 1

## 2022-07-25 MED ORDER — METOPROLOL TARTRATE 12.5 MG HALF TABLET
12.5000 mg | ORAL_TABLET | Freq: Two times a day (BID) | ORAL | Status: DC
  Administered 2022-07-25 – 2022-07-27 (×6): 12.5 mg via ORAL
  Filled 2022-07-25 (×6): qty 1

## 2022-07-25 MED ORDER — SODIUM CHLORIDE 0.9% FLUSH: 3.0000 mL | INTRAVENOUS | Status: DC | PRN

## 2022-07-25 MED ORDER — DEXTROSE 50 % IV SOLN
12.5000 g | INTRAVENOUS | Status: AC
  Administered 2022-07-25: 12.5 g via INTRAVENOUS
  Filled 2022-07-25: qty 50

## 2022-07-25 MED ORDER — SODIUM CHLORIDE 0.9 % IV SOLN: Freq: Once | INTRAVENOUS | Status: AC

## 2022-07-25 NOTE — Progress Notes (Signed)
ANTICOAGULATION CONSULT NOTE - Follow Up Consult  Pharmacy Consult for heparin Indication: chest pain/ACS  Labs: Recent Labs    07/23/22 2120 07/23/22 2120 07/24/22 0310 07/24/22 1200 07/24/22 1220 07/24/22 1816 07/25/22 0459 07/25/22 0532  HGB 10.9*  --  10.5*  --   --   --   --  9.8*  HCT 36.2  --  34.9*  --   --   --   --  32.5*  PLT 226  --  215  --   --   --   --  226  HEPARINUNFRC  --    < > 0.13* >1.10*  --  0.92* 0.12*  --   CREATININE 4.17*  --  4.66*  --   --   --   --   --   TROPONINIHS 27*  --   --   --  5,860*  --   --   --    < > = values in this interval not displayed.     Assessment: 41yo female subtherapeutic on heparin after rate change; no infusion issues or signs of bleeding per RN.  Goal of Therapy:  Heparin level 0.3-0.7 units/ml   Plan:  Will increase heparin infusion back to 800 units/hr and check level in 8 hours.    Wynona Neat, PharmD, BCPS  07/25/2022,6:36 AM

## 2022-07-25 NOTE — Progress Notes (Signed)
Per verbal order from Dr. Jonnie Finner pt can have pain med with sips.

## 2022-07-25 NOTE — ED Notes (Addendum)
Pt BGL 66. RN will contact MD.

## 2022-07-25 NOTE — Progress Notes (Signed)
[facility] Progress Note  Subjective: pt seen in HD, no c/o's.   Vitals:   07/25/22 1030 07/25/22 1100 07/25/22 1111 07/25/22 1125  BP: 100/65 95/62 94/61  109/70  Pulse: 97 97 (!) 101   Resp: 12 12 16    Temp:   98.1 F (36.7 C)   TempSrc:   Oral   SpO2: 100% 100% 99%   Weight:      Height:        Exam: Gen alert, no distress No jvd or bruits Chest clear bilat to bases RRR no MR Abd soft ntnd MS L AKA (dressed) and R AKA, no edema Neuro is alert, Ox 3 , nf    RUA AVF+bruit       Home meds include - asa, lipitor, plavix, insulin, prns/     OP HD: [place] MWF 3h 2min  400/1.5  47.8kg  2/2 bath   Hep none  RUA AVF - last HD 12/11, post wt 48.1kg - mircera 225 mcg q2, last 12/1, due 12/15 - no vdra     Assessment/ Plan: Chest pain / NSTEMI - due to ACS vs HTN urgency, per pmd and cardiology. Getting IV ntg and heparin. Per pmd/ cardiology. Possibly to get LHC soon.  BP/ volume - BP's better controlled today. No vol excess on exam.  ESRD - on HD MWF. Had not missed HD. HD today in progress.  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 Taking sensipar 90 qd but no vdra or binder per OP records. Continue.      [name] 07/25/2022, 11:42 AM   Recent Labs  Lab 07/24/22 0310 07/25/22 0532  HGB 10.5* 9.8*  ALBUMIN  --  2.3*  CALCIUM 9.0 9.5  CREATININE 4.66* 6.26*  K 5.1 5.7*   No results for input(s): "IRON", "TIBC", "FERRITIN" in the last 168 hours. Inpatient medications:  aspirin EC  81 mg Oral Daily   atorvastatin  40 mg Per Tube Daily   Chlorhexidine Gluconate Cloth  6 each Topical Q0600   cinacalcet  90 mg Oral Q supper   clopidogrel  75 mg Oral Daily   [START ON 07/27/2022] darbepoetin (ARANESP) injection - DIALYSIS  200 mcg Subcutaneous Q Fri-1800   insulin aspart  0-6 Units Subcutaneous Q4H   sodium  chloride flush  3 mL Intravenous Q12H    heparin 800 Units/hr (07/25/22 0749)   nitroGLYCERIN 25 mcg/min (07/25/22 0749)   acetaminophen, ondansetron (ZOFRAN) IV, oxyCODONE-acetaminophen

## 2022-07-25 NOTE — ED Notes (Addendum)
RN consulted MD chen about patient current blood glucose level being 66 and pt being NPO. Dextrose 12.5 g ordered. RN was informed there is a possibility for the pt go for LHC and should remain NPO.

## 2022-07-25 NOTE — Progress Notes (Addendum)
Pt in stretcher, Alert, tx terminated early 34 minutes remaining, Dr. Jonnie Finner notified of early termination, pt sent to unit 4E05.    07/25/22 1111  Vitals  Temp 98.1 F (36.7 C)  Temp Source Oral  BP 94/61  MAP (mmHg) 71  BP Location Left Arm  BP Method Automatic  Patient Position (if appropriate) Lying  Pulse Rate (!) 101  Pulse Rate Source Monitor  ECG Heart Rate 100  Resp 16  Oxygen Therapy  SpO2 99 %  O2 Device Room Air  During Treatment Monitoring  Blood Flow Rate (mL/min) 350 mL/min  Arterial Pressure (mmHg) -40 mmHg  Venous Pressure (mmHg) 220 mmHg  TMP (mmHg) 7 mmHg  Ultrafiltration Rate (mL/min) 1100 mL/min  Dialysate Flow Rate (mL/min) 300 ml/min  HD Safety Checks Performed Yes  Intra-Hemodialysis Comments Tx completed (Ended early due to being in pain. pt request)  Post Treatment  Duration of HD Treatment -hour(s) 2.26 hour(s)  Liters Processed 51.4  Fluid Removed (mL) 2.4 mL  Tolerated HD Treatment No (Comment) (off 34 mins early due to pain. pt request)

## 2022-07-25 NOTE — Progress Notes (Addendum)
Pt came to rm 4e5 from HD with heaprin drip  running at 63ml/hr and nitro drip running at 7.5 ml/hr. CHG wipe given. Initiated tele. Pt complained of chest pain. New EKG obtained. Primary MD notified. VSS. Call bell within reach. Placed pt to air mattress.     Lavenia Atlas, RN

## 2022-07-25 NOTE — Progress Notes (Addendum)
Rounding Note    Patient Name: [name] Date of Encounter: 07/25/2022  [place] Cardiologist: None   Subjective   Seen while in HD, had some sharp chest pain last night. None this morning.   Inpatient Medications    Scheduled Meds:  aspirin  324 mg Oral Once   aspirin EC  81 mg Oral Daily   atorvastatin  40 mg Per Tube Daily   Chlorhexidine Gluconate Cloth  6 each Topical Q0600   cinacalcet  90 mg Oral Q supper   [START ON 07/27/2022] darbepoetin (ARANESP) injection - DIALYSIS  200 mcg Subcutaneous Q Fri-1800   insulin aspart  0-6 Units Subcutaneous Q4H   Continuous Infusions:  heparin 800 Units/hr (07/25/22 0749)   nitroGLYCERIN 25 mcg/min (07/25/22 0749)   PRN Meds: acetaminophen, ondansetron (ZOFRAN) IV, oxyCODONE-acetaminophen   Vital Signs    Vitals:   07/25/22 0835 07/25/22 0900 07/25/22 0930 07/25/22 1000  BP: (!) 154/77 (!) 146/78 130/78 103/65  Pulse: 86 96 96 94  Resp: (!) 8 14 17 16   Temp:      TempSrc:      SpO2: 100% 100% 100% 100%  Weight:      Height:        Intake/Output Summary (Last 24 hours) at 07/25/2022 1016 Last data filed at 07/24/2022 1636 Gross per 24 hour  Intake 211.67 ml  Output --  Net 211.67 ml      07/23/2022    9:00 PM 07/23/2022    8:00 PM 07/04/2022    5:00 AM  Last 3 Weights  Weight (lbs) 106 lb 0.7 oz 106 lb 106 lb 0.7 oz  Weight (kg) 48.1 kg 48.081 kg 48.1 kg      Telemetry    Sinus Rhythm - Personally Reviewed  ECG    No new tracing   Physical Exam   GEN: No acute distress.   Neck: No JVD Cardiac: RRR, no murmurs, rubs, or gallops.  Respiratory: Clear to auscultation bilaterally. GI: Soft, nontender, non-distended  MS: Bilateral AKA Neuro:  Nonfocal  Psych: Normal affect   Labs    High Sensitivity Troponin:   Recent Labs  Lab 07/23/22 2120 07/24/22 1220  TROPONINIHS 27* 5,860*     Chemistry Recent Labs  Lab 07/23/22 2120 07/24/22 0310 07/25/22 0532  NA 137 139  137  K 4.9 5.1 5.7*  CL 97* 99 97*  CO2 28 28 29   GLUCOSE 271* 319* 67*  BUN 18 25* 41*  CREATININE 4.17* 4.66* 6.26*  CALCIUM 8.9 9.0 9.5  MG  --   --  2.0  PROT  --   --  6.0*  ALBUMIN  --   --  2.3*  AST  --   --  114*  ALT  --   --  15  ALKPHOS  --   --  61  BILITOT  --   --  0.4  GFRNONAA 13* 12* 8*  ANIONGAP 12 12 11     Lipids  Recent Labs  Lab 07/24/22 0310  CHOL 148  TRIG 130  HDL 50  LDLCALC 72  CHOLHDL 3.0    Hematology Recent Labs  Lab 07/23/22 2120 07/24/22 0310 07/25/22 0532  WBC 6.6 7.3 9.3  RBC 4.20 4.03 3.81*  HGB 10.9* 10.5* 9.8*  HCT 36.2 34.9* 32.5*  MCV 86.2 86.6 85.3  MCH 26.0 26.1 25.7*  MCHC 30.1 30.1 30.2  RDW 21.2* 21.0* 20.4*  PLT 226 215 226   Thyroid No results  for input(s): "TSH", "FREET4" in the last 168 hours.  BNPNo results for input(s): "BNP", "PROBNP" in the last 168 hours.  DDimer No results for input(s): "DDIMER" in the last 168 hours.   Radiology    ECHOCARDIOGRAM COMPLETE  Result Date: 07/24/2022    ECHOCARDIOGRAM REPORT   Patient Name:   [name] Date of Exam: 07/24/2022 Medical Rec #:  [id number]     Height:       61.0 in Accession #:    [id number]    Weight:       106.0 lb Date of Birth:  [birth year]      BSA:          1.442 m Patient Age:    41 years      BP:           141/76 mmHg Patient Gender: F             HR:           83 bpm. Exam Location:  Inpatient Procedure: 2D Echo, 3D Echo, Cardiac Doppler and Color Doppler                                MODIFIED REPORT: This report was modified by Rudean Haskell MD on 07/24/2022 due to [place].  Indications:     Chest Pain R07.9  History:         Patient has prior history of Echocardiogram examinations, most                  recent 10/02/2021. CAD, Stroke, Signs/Symptoms:Chest Pain; Risk                  Factors:Diabetes, Dyslipidemia, Hypertension and Non-Smoker.  Sonographer:     Greer Pickerel Referring Phys:  [id number] [employer] [place] Diagnosing Phys: Rudean Haskell MD   Sonographer Comments: Image acquisition challenging due to patient body habitus and Image acquisition challenging due to respiratory motion. Patient not able to adjust due to pain. IMPRESSIONS  1. Left ventricular ejection fraction, by estimation, is 60 to 65%. The left ventricle has normal function. The left ventricle has no regional wall motion abnormalities. There is mild concentric left ventricular hypertrophy. Left ventricular diastolic parameters are consistent with Grade II diastolic dysfunction (pseudonormalization).  2. Right ventricular systolic function is normal. The right ventricular size is normal.  3. The mitral valve is abnormal. No evidence of mitral valve regurgitation. No evidence of mitral stenosis.  4. The aortic valve was not well visualized. There is moderate calcification of the aortic valve. Aortic valve regurgitation is not visualized. No aortic stenosis is present.  5. The inferior vena cava is normal in size with greater than 50% respiratory variability, suggesting right atrial pressure of 3 mmHg. FINDINGS  Left Ventricle: Left ventricular ejection fraction, by estimation, is 60 to 65%. The left ventricle has normal function. The left ventricle has no regional wall motion abnormalities. 3D left ventricular ejection fraction analysis performed but not reported based on interpreter judgement due to suboptimal tracking. The left ventricular internal cavity size was small. There is mild concentric left ventricular hypertrophy. Left ventricular diastolic parameters are consistent with Grade II diastolic dysfunction (pseudonormalization). Right Ventricle: The right ventricular size is normal. No increase in right ventricular wall thickness. Right ventricular systolic function is normal. Left Atrium: Left atrial size was normal in size. Right Atrium: Right atrial size was normal in size. Pericardium: There is  no evidence of pericardial effusion. Mitral Valve: The mitral valve is abnormal. No  evidence of mitral valve regurgitation. No evidence of mitral valve stenosis. Tricuspid Valve: The tricuspid valve is normal in structure. Tricuspid valve regurgitation is not demonstrated. Aortic Valve: The aortic valve was not well visualized. There is moderate calcification of the aortic valve. Aortic valve regurgitation is not visualized. No aortic stenosis is present. Pulmonic Valve: The pulmonic valve was not well visualized. Pulmonic valve regurgitation is not visualized. Aorta: The aortic root is normal in size and structure. Venous: The inferior vena cava is normal in size with greater than 50% respiratory variability, suggesting right atrial pressure of 3 mmHg. IAS/Shunts: No atrial level shunt detected by color flow Doppler.  LEFT VENTRICLE PLAX 2D LVIDd:         3.70 cm   Diastology LVIDs:         2.00 cm   LV e' medial:    4.91 cm/s LV PW:         0.90 cm   LV E/e' medial:  20.6 LV IVS:        1.00 cm   LV e' lateral:   5.34 cm/s LVOT diam:     1.70 cm   LV E/e' lateral: 18.9 LV SV:         37 LV SV Index:   26 LVOT Area:     2.27 cm                           3D Volume EF:                          3D EF:        50 %                          LV EDV:       110 ml                          LV ESV:       55 ml                          LV SV:        55 ml RIGHT VENTRICLE RV S prime:     9.11 cm/s TAPSE (M-mode): 1.6 cm LEFT ATRIUM           Index        RIGHT ATRIUM           Index LA diam:      2.50 cm 1.73 cm/m   RA Area:     10.90 cm LA Vol (A2C): 29.2 ml 20.24 ml/m  RA Volume:   20.30 ml  14.07 ml/m LA Vol (A4C): 41.9 ml 29.05 ml/m  AORTIC VALVE LVOT Vmax:   84.90 cm/s LVOT Vmean:  59.600 cm/s LVOT VTI:    0.165 m  AORTA Ao Root diam: 3.10 cm MITRAL VALVE MV Area (PHT): 3.74 cm     SHUNTS MV Decel Time: 203 msec     Systemic VTI:  0.16 m MR Peak grad: 1.7 mmHg      Systemic Diam: 1.70 cm MR Vmax:      65.90 cm/s MV E velocity: 101.00 cm/s MV A velocity: 73.30 cm/s MV E/A ratio:  1.38 Mahesh  Gasper Sells MD Electronically signed by Rudean Haskell MD Signature Date/Time: 07/24/2022/12:54:44 PM    Final (Updated)    DG Chest Portable 1 View  Result Date: 07/23/2022 CLINICAL DATA:  Chest pain and shortness of breath EXAM: PORTABLE CHEST 1 VIEW .  Patient is rotated COMPARISON:  Chest x-ray 02/09/2022 FINDINGS: The heart and mediastinal contours are within normal limits. No focal consolidation. No pulmonary edema. No pleural effusion. No pneumothorax. No acute osseous abnormality. IMPRESSION: No active disease. Electronically Signed   By: Iven Finn M.D.   On: 07/23/2022 21:38    Cardiac Studies   Echo: 07/24/2022  IMPRESSIONS     1. Left ventricular ejection fraction, by estimation, is 60 to 65%. The  left ventricle has normal function. The left ventricle has no regional  wall motion abnormalities. There is mild concentric left ventricular  hypertrophy. Left ventricular diastolic  parameters are consistent with Grade II diastolic dysfunction  (pseudonormalization).   2. Right ventricular systolic function is normal. The right ventricular  size is normal.   3. The mitral valve is abnormal. No evidence of mitral valve  regurgitation. No evidence of mitral stenosis.   4. The aortic valve was not well visualized. There is moderate  calcification of the aortic valve. Aortic valve regurgitation is not  visualized. No aortic stenosis is present.   5. The inferior vena cava is normal in size with greater than 50%  respiratory variability, suggesting right atrial pressure of 3 mmHg.   FINDINGS   Left Ventricle: Left ventricular ejection fraction, by estimation, is 60  to 65%. The left ventricle has normal function. The left ventricle has no  regional wall motion abnormalities. 3D left ventricular ejection fraction  analysis performed but not  reported based on interpreter judgement due to suboptimal tracking. The  left ventricular internal cavity size was small. There  is mild concentric  left ventricular hypertrophy. Left ventricular diastolic parameters are  consistent with Grade II diastolic  dysfunction (pseudonormalization).   Right Ventricle: The right ventricular size is normal. No increase in  right ventricular wall thickness. Right ventricular systolic function is  normal.   Left Atrium: Left atrial size was normal in size.   Right Atrium: Right atrial size was normal in size.   Pericardium: There is no evidence of pericardial effusion.   Mitral Valve: The mitral valve is abnormal. No evidence of mitral valve  regurgitation. No evidence of mitral valve stenosis.   Tricuspid Valve: The tricuspid valve is normal in structure. Tricuspid  valve regurgitation is not demonstrated.   Aortic Valve: The aortic valve was not well visualized. There is moderate  calcification of the aortic valve. Aortic valve regurgitation is not  visualized. No aortic stenosis is present.   Pulmonic Valve: The pulmonic valve was not well visualized. Pulmonic valve  regurgitation is not visualized.   Aorta: The aortic root is normal in size and structure.   Venous: The inferior vena cava is normal in size with greater than 50%  respiratory variability, suggesting right atrial pressure of 3 mmHg.   IAS/Shunts: No atrial level shunt detected by color flow Doppler.       Patient Profile     41 y.o. female with history of ESRD on HD, Severe PAD s/p bilateral AKA, DM admitted with chest pain and abnormal EKG. Subtle inferior ST elevation on first EKG. Code STEMI cancelled by Dr. Tamala Julian. Her chest pain resolved by the time she arrived in the ED   Assessment & Plan  Chest pain NSTEMI -- initially presented with chest pain and EKG concerning for STEMI. Evaluated the ED by Dr. Tamala Julian, CODE STEMI canceled with rec's for IV heparin, nitro and loaded with plavix as she was pain free. Follow up troponin up to 5860 yesterday though remained pain free. Follow up echo  showed LVEF of 60-65%, no rWMA noted. Did have chest pain overnight.  -- will keep NPO, review with MD regarding diagnostic cath? Clearly not a candidate for CABG if she does have multivessel disease -- continue ASA, plavix, statin. Ideally add metoprolol but BP is soft in HD  Shared Decision Making/Informed Consent{ The risks [stroke (1 in 1000), death (1 in 1000), kidney failure [usually temporary] (1 in 500), bleeding (1 in 200), allergic reaction [possibly serious] (1 in 200)], benefits (diagnostic support and management of coronary artery disease) and alternatives of a cardiac catheterization were discussed in detail with Ms. Barbee and she is willing to proceed.  ESRD on HD -- seen in HD this morning -- nephrology following   Severe PAD s/p bilateral AKA  For questions or updates, please contact [place] Please consult [email or web address] for contact info under        Signed, Reino Bellis, NP  07/25/2022, 10:16 AM    I have personally seen and examined this patient. I agree with the assessment and plan as outlined above.  She was admitted with chest pain. Elevated troponin. Recurrent chest pain overnight. Cardiac cath was planned for today but she is seen in the HD unit and is refusing cath today. She is complaining of hand pain on HD.  Continue current medical therapy with ASA, Plavix,statin and IV heparin.  She will not be a candidate for CABG given bilateral AKA.  If she refuses cardiac cath tomorrow as well, we will proceed with a conservative approach to her NSTEMI She understands the risk of delaying invasive cardiac evaluation.   Lauree Chandler, MD, [facility] 07/25/2022 11:15 AM

## 2022-07-25 NOTE — Progress Notes (Signed)
ANTICOAGULATION CONSULT NOTE - Initial Consult  Pharmacy Consult for Heparin Infusion Indication: chest pain/ACS  Allergies  Allergen Reactions   Morphine Rash and Other (See Comments)   Peanut-Containing Drug Products Anaphylaxis and Hives   Penicillins Rash and Other (See Comments)    Rash in 2008.  Tolerated cefazolin in 2020   Chocolate Hives   Shellfish Allergy Swelling    Patient Measurements: Height: _0  (154.9 cm) Weight:  (unable to get due to pt in stretcher) IBW/kg (Calculated) : 47.8 Heparin Dosing Weight: 48.1 kg  Vital Signs: Temp: 98.5 F (36.9 C) (12/13 1530) Temp Source: Oral (12/13 1530) BP: 120/95 (12/13 1530) Pulse Rate: 79 (12/13 1530)  Labs: Recent Labs    07/23/22 2120 07/24/22 0310 07/24/22 1200 07/24/22 1220 07/24/22 1816 07/25/22 0459 07/25/22 0532 07/25/22 1806  HGB 10.9* 10.5*  --   --   --   --  9.8*  --   HCT 36.2 34.9*  --   --   --   --  32.5*  --   PLT 226 215  --   --   --   --  226  --   HEPARINUNFRC  --  0.13*   < >  --  0.92* 0.12*  --  0.20*  CREATININE 4.17* 4.66*  --   --   --   --  6.26*  --   TROPONINIHS 27*  --   --  5,860*  --   --   --   --    < > = values in this interval not displayed.     Estimated Creatinine Clearance: 9 mL/min (A) (by C-G formula based on SCr of 6.26 mg/dL (H)).   Medical History: Past Medical History:  Diagnosis Date   Anemia    Anxiety    Blind right eye 2008   Diabetes mellitus without complication ([place])    Embolic stroke ([place])    ESRD (end stage renal disease) ([place])    HD on M/W/F   ESRD on hemodialysis ([place])    GBS (Guillain Barre syndrome) (H[facility])     Medications:  Medications Prior to Admission  Medication Sig Dispense Refill Last Dose   cinacalcet (SENSIPAR) 90 MG tablet Take 90 mg by mouth daily.   07/23/2022   clindamycin (CLEOCIN) 300 MG capsule Take 300 mg by mouth every 12 (twelve) hours.   07/23/2022 at 2000   diphenhydrAMINE (BENADRYL) 25 mg capsule Take 25 mg by  mouth once.   07/19/2022   docusate (COLACE) 50 MG/5ML liquid Take 100 mg by mouth 2 (two) times daily.   07/23/2022   dorzolamide-timolol (COSOPT) 22.3-6.8 MG/ML ophthalmic solution Place 1 drop into the left eye 2 (two) times daily.   07/23/2022   fluconazole (DIFLUCAN) 150 MG tablet Take 150 mg by mouth every Monday, Wednesday, and Friday. 1 tablet by mouth every Monday, Wednesday, and Friday for 3 administrations.   07/16/2022   Glucagon, rDNA, (GLUCAGON EMERGENCY) 1 MG KIT Inject 1 mg into the muscle once. If BS drops below 60   unknown   insulin aspart (NOVOLOG) 100 UNIT/ML injection Inject 0-6 Units into the skin every 4 (four) hours. (Patient taking differently: Inject 0-10 Units into the skin 3 (three) times daily with meals. 121-150=1 unit 152-200=2 units 201-250=3 units 251-300=5 units 301-350=7 units 351-400=10 units) 10 mL 11 07/22/2022   insulin glargine (SEMGLEE) 100 UNIT/ML injection Inject 30 Units into the skin daily at 6 PM.   07/22/2022   midodrine (PROAMATINE)  5 MG tablet Take 5 mg by mouth 3 (three) times daily.   07/23/2022   Multiple Vitamin (MULTIVITAMIN WITH MINERALS) TABS tablet Take 1 tablet by mouth daily.   07/23/2022   NON FORMULARY Place 30 mLs into feeding tube daily. ProHeal   07/18/2022   Nutritional Supplements (NUTRITIONAL DRINK) LIQD Take 120 mLs by mouth daily.   07/23/2022   ondansetron (ZOFRAN) 4 MG tablet Take 4 mg by mouth every 6 (six) hours as needed for nausea/vomiting.   unknown   oxyCODONE-acetaminophen (PERCOCET) 5-325 MG tablet Take 1 tablet by mouth every 8 (eight) hours as needed for severe pain.   07/23/2022   polyethylene glycol (MIRALAX / GLYCOLAX) 17 g packet Take 17 g by mouth daily at 6 PM.   07/23/2022   predniSONE (DELTASONE) 20 MG tablet Take 40 mg by mouth once.   07/19/2022   Probiotic, Lactobacillus, CAPS Take 1 capsule by mouth in the morning and at bedtime.   07/23/2022   thiamine 100 MG tablet Place 1 tablet (100 mg total) into  feeding tube daily.   07/23/2022   Accu-Chek FastClix Lancets MISC Use as instructed to check blood sugar up to TID. E11.22 (Patient taking differently: 1 each by Other route See admin instructions. Use as instructed to check blood sugar up to TID. E11.22) 102 each 11    atorvastatin (LIPITOR) 40 MG tablet Place 1 tablet (40 mg total) into feeding tube daily. (Patient not taking: Reported on 07/24/2022)   Not Taking   Blood Glucose Monitoring Suppl (ACCU-CHEK GUIDE ME) w/Device KIT 1 kit by Does not apply route 3 (three) times daily. Use to check BG at home up to 3 times daily. E11.22 1 kit 0    Continuous Blood Gluc Transmit (DEXCOM G6 TRANSMITTER) MISC 1 Device by Does not apply route as directed. 1 each 3    glucose blood (ACCU-CHEK GUIDE) test strip Use as instructed to check blood sugar up to TID. E11.22 (Patient taking differently: 1 each by Other route See admin instructions. Use as instructed to check blood sugar up to TID. E11.22) 100 each 12    Insulin Pen Needle 32G X 4 MM MISC 1 Device by Does not apply route as directed. 150 each 11    nystatin (MYCOSTATIN) 100000 UNIT/ML suspension Take 5 mLs (500,000 Units total) by mouth 4 (four) times daily. 60 mL 0    sodium chloride flush (NS) 0.9 % SOLN Inject 10 mLs into the vein at bedtime. (Patient not taking: Reported on 07/24/2022)   Not Taking    Assessment: 41 yo F BIB EMS for Code STEMI. Pt received aspirin 381m x 1 with heparin 2900 units (~60 units/kg) IV bolus in ED. Code STEMI cancelled, determined to be NSTEMI. Pt is not taking anticoagulation prior to admission. Pharmacy consulted to dose heparin infusion per ACS protocol.   Heparin level came back subtherapeutic this PM. We will increase rate and check level in AM.  Goal of Therapy:  Heparin level 0.3-0.7 units/ml Monitor platelets by anticoagulation protocol: Yes   Plan:  Increase heparin infusion rate to 900 units/hr Check heparin level in AM Monitor daily CBC, heparin  level, and for s/sx of bleeding  MOnnie Boer PharmD, BCIDP, AAHIVP, CPP Infectious Disease Pharmacist 07/25/2022 7:02 PM

## 2022-07-25 NOTE — ED Notes (Signed)
Report received from [facility]. Care assumed for patient.

## 2022-07-25 NOTE — ED Notes (Signed)
Ntg and heparin gtt infusing, pt alert, NAD, calm, interactive, consent signed for HD, CBG 89, VSS, up to HD.

## 2022-07-25 NOTE — Progress Notes (Signed)
PROGRESS NOTE  Fred Hammes PNT:[id number] DOB: Oct 03, 1980 DOA: 07/23/2022 PCP: [facility]   LOS: 1 day   Brief Narrative / Interim history: 41 year old female with history of ESRD on MWF dialysis, DM2, HTN, extensive history of atherosclerotic vascular disease including PAD, stroke, bilateral lower extremities AKA's comes in to the hospital with complaints of substernal chest pain that happened on Monday after regular HD.  She was able to complete dialysis and chest pain started  right afterwards.  EKG done on the way to the ER was concerning for STEMI, code STEMI was activated but was cleared by cardiology and admitted to the hospitalist service with cardiology consultation.  Currently refusing cath.    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  She was placed on heparin infusion.  High-sensitivity troponin last night was 27, and repeat today is elevated to almost 6000.  Appreciate cardiology follow-up, looks like she may need ischemic evaluation with a cardiac cath-- currently refusing   Hypertensive emergency -BP normal range currently  ESRD-nephrology consulted -HD 12/13  Sacral decubitus ulcer, stage II-wound care consult  Right eye blindness  Hyperlipidemia-continue statin  DM2, with hyperglycemia-continue glargine and sliding scale    Scheduled Meds:  aspirin EC  81 mg Oral Daily   atorvastatin  40 mg Per Tube Daily    Chlorhexidine Gluconate Cloth  6 each Topical Q0600   cinacalcet  90 mg Oral Q supper   clopidogrel  75 mg Oral Daily   [START ON 07/27/2022] darbepoetin (ARANESP) injection - DIALYSIS  200 mcg Subcutaneous Q Fri-1800   insulin aspart  0-6 Units Subcutaneous Q4H   sodium chloride flush  3 mL Intravenous Q12H   Continuous Infusions:  heparin 800 Units/hr (07/25/22 0749)   nitroGLYCERIN 25 mcg/min (07/25/22 0749)   PRN Meds:.acetaminophen, ondansetron (ZOFRAN) IV, oxyCODONE-acetaminophen  Current Outpatient Medications  Medication Instructions   Accu-Chek FastClix Lancets MISC Use as instructed to check blood sugar up to TID. E11.22   atorvastatin (LIPITOR) 40 mg, Per Tube, Daily   Blood Glucose Monitoring Suppl (ACCU-CHEK GUIDE ME) w/Device KIT 1 kit, Does not apply, 3 times daily, Use to check BG at home up to 3 times daily. E11.22   cinacalcet (SENSIPAR) 90 mg, Oral, Daily   clindamycin (CLEOCIN) 300 mg, Oral, Every 12 hours   Continuous Blood Gluc Transmit (DEXCOM G6 TRANSMITTER) MISC 1 Device, Does not apply, As directed   diphenhydrAMINE (BENADRYL) 25 mg, Oral,  Once   docusate (COLACE) 100 mg, Oral, 2 times daily   dorzolamide-timolol (COSOPT) 22.3-6.8 MG/ML ophthalmic solution 1 drop, Left Eye, 2 times daily   fluconazole (DIFLUCAN) 150 mg, Oral, Every M-W-F, 1 tablet by mouth every Monday, Wednesday, and Friday for 3 administrations.   Glucagon Emergency 1 mg, Intramuscular,  Once, If BS drops below 60    glucose blood (ACCU-CHEK GUIDE) test strip Use as instructed to check blood sugar up to TID. E11.22   insulin aspart (NOVOLOG) 0-6 Units,  Subcutaneous, Every 4 hours   insulin glargine (SEMGLEE) 30 Units, Subcutaneous, Daily-1800   Insulin Pen Needle 32G X 4 MM MISC 1 Device, Does not apply, As directed   midodrine (PROAMATINE) 5 mg, Oral, 3 times daily   Multiple Vitamin (MULTIVITAMIN WITH MINERALS) TABS tablet 1 tablet, Oral, Daily   NON FORMULARY 30 mLs, Per Tube, Daily,  ProHeal   Nutritional Supplements (NUTRITIONAL DRINK) LIQD 120 mLs, Oral, Daily   nystatin (MYCOSTATIN) 500,000 Units, Oral, 4 times daily   ondansetron (ZOFRAN) 4 mg, Oral, Every 6 hours PRN   oxyCODONE-acetaminophen (PERCOCET) 5-325 MG tablet 1 tablet, Oral, Every 8 hours PRN   polyethylene glycol (MIRALAX / GLYCOLAX) 17 g, Oral, Daily-1800   predniSONE (DELTASONE) 40 mg, Oral,  Once   Probiotic, Lactobacillus, CAPS 1 capsule, Oral, 2 times daily   sodium chloride flush (NS) 0.9 % SOLN 10 mLs, Daily at bedtime   thiamine (VITAMIN B1) 100 mg, Per Tube, Daily    Diet Orders (From admission, onward)     Start     Ordered   07/25/22 0001  Diet NPO time specified  Diet effective midnight        07/24/22 1613            DVT prophylaxis:    Lab Results  Component Value Date   PLT 226 07/25/2022      Code Status: Full Code  Family Communication: no family at bedside  Status is: Inpatient  Level of care: Progressive  Consultants:  Cardiology Nephrology   Objective: Vitals:   07/25/22 1030 07/25/22 1100 07/25/22 1111 07/25/22 1125  BP: 1_0 109/70  Pulse: 97 97 (!) 101   Resp: _1 Temp:   98.1 F (36.7 C)   TempSrc:   Oral   SpO2: 100% 100% 99%   Weight:      Height:        Intake/Output Summary (Last 24 hours) at 07/25/2022 1202 Last data filed at 07/25/2022 1111 Gross per 24 hour  Intake 211.67 ml  Output 2.4 ml  Net 209.27 ml   Wt Readings from Last 3 Encounters:  07/23/22 48.1 kg  07/04/22 48.1 kg  04/06/22 59.4 kg    Examination:  General: Appearance:    Well developed, well nourished female in no acute distress     Lungs:      respirations unlabored  Heart:    Tachycardic. Normal rhythm. No murmurs, rubs, or gallops.   MS:   B/l amputation  Neurologic:   Awake, alert     Data Reviewed: I have independently reviewed following labs and imaging studies   CBC Recent Labs  Lab 07/23/22 2120 07/24/22 0310  07/25/22 0532  WBC 6.6 7.3 9.3  HGB 10.9* 10.5* 9.8*  HCT 36.2 34.9* 32.5*  PLT 226 215 226  MCV 86.2 86.6 85.3  MCH 26.0 26.1 25.7*  MCHC 30.1 30.1 30.2  RDW 21.2* 21.0* 20.4*    Recent Labs  Lab 07/23/22 2120 07/24/22 0310 07/25/22 0532  NA 137 139 137  K 4.9 5.1 5.7*  CL 97* 99 97*  CO2 _2 GLUCOSE 271* 319* 67*  BUN 18 25* 41*  CREATININE 4.17* 4.66* 6.26*  CALCIUM 8.9 9.0 9.5  AST  --   --  114*  ALT  --   --  15  ALKPHOS  --   --  61  BILITOT  --   --  0.4  ALBUMIN  --   --  2.3*  MG  --   --  2.0    ------------------------------------------------------------------------------------------------------------------ Recent Labs    07/24/22 0310  CHOL 148  HDL 50  LDLCALC 72  TRIG 130  CHOLHDL 3.0    Lab Results  Component Value Date   HGBA1C 5.1 06/28/2022   ------------------------------------------------------------------------------------------------------------------ No results for input(s): "TSH", "T4TOTAL", "T3FREE", "THYROIDAB" in the last 72 hours.  Invalid input(s): "FREET3"  Cardiac Enzymes No results for input(s): "CKMB", "TROPONINI", "MYOGLOBIN" in the last 168 hours.  Invalid input(s): "CK" ------------------------------------------------------------------------------------------------------------------ No results found for: "BNP"  CBG: Recent Labs  Lab 07/24/22 1932 07/24/22 2131 07/25/22 0012 07/25/22 0530 07/25/22 0738  GLUCAP 175* 143* 121* 66* 89    No results found for this or any previous visit (from the past 240 hour(s)).   Radiology Studies: No results found.  Eulogio Bear DO [facility]  Between 7 am - 7 pm I am available, please contact me via Amion (for emergencies) or Securechat (non urgent messages)  Between 7 pm - 7 am I am not available, please contact night coverage MD/APP via Amion

## 2022-07-26 ENCOUNTER — Encounter (HOSPITAL_COMMUNITY)
Admission: EM | Disposition: A | Discharge status: Skilled Nursing Facility | Payer: Self-pay | Source: Home / Self Care | Attending: Internal Medicine

## 2022-07-26 ENCOUNTER — Encounter (HOSPITAL_COMMUNITY): Payer: Self-pay | Admitting: Cardiology

## 2022-07-26 DIAGNOSIS — I2510 Atherosclerotic heart disease of native coronary artery without angina pectoris: Secondary | ICD-10-CM | POA: Diagnosis not present

## 2022-07-26 DIAGNOSIS — I25119 Atherosclerotic heart disease of native coronary artery with unspecified angina pectoris: Secondary | ICD-10-CM | POA: Diagnosis not present

## 2022-07-26 DIAGNOSIS — I214 Non-ST elevation (NSTEMI) myocardial infarction: Secondary | ICD-10-CM | POA: Diagnosis not present

## 2022-07-26 HISTORY — PX: LEFT HEART CATH AND CORONARY ANGIOGRAPHY: CATH118249

## 2022-07-26 LAB — BASIC METABOLIC PANEL
Anion gap: 12 (ref 5–15)
BUN: 30 mg/dL — ABNORMAL HIGH (ref 6–20)
CO2: 30 mmol/L (ref 22–32)
Calcium: 8.4 mg/dL — ABNORMAL LOW (ref 8.9–10.3)
Chloride: 94 mmol/L — ABNORMAL LOW (ref 98–111)
Creatinine, Ser: 5.21 mg/dL — ABNORMAL HIGH (ref 0.44–1.00)
GFR, Estimated: 10 mL/min — ABNORMAL LOW (ref >60)
Glucose, Bld: 122 mg/dL — ABNORMAL HIGH (ref 70–99)
Potassium: 5 mmol/L (ref 3.5–5.1)
Sodium: 136 mmol/L (ref 135–145)

## 2022-07-26 LAB — CBC
HCT: 30.6 % — ABNORMAL LOW (ref 36.0–46.0)
Hemoglobin: 9.3 g/dL — ABNORMAL LOW (ref 12.0–15.0)
MCH: 25.8 pg — ABNORMAL LOW (ref 26.0–34.0)
MCHC: 30.4 g/dL (ref 30.0–36.0)
MCV: 84.8 fL (ref 80.0–100.0)
Platelets: 205 10*3/uL (ref 150–400)
RBC: 3.61 MIL/uL — ABNORMAL LOW (ref 3.87–5.11)
RDW: 20.2 % — ABNORMAL HIGH (ref 11.5–15.5)
WBC: 9.3 10*3/uL (ref 4.0–10.5)
nRBC: 0 % (ref 0.0–0.2)

## 2022-07-26 LAB — GLUCOSE, CAPILLARY
Glucose-Capillary: 155 mg/dL — ABNORMAL HIGH (ref 70–99)
Glucose-Capillary: 109 mg/dL — ABNORMAL HIGH (ref 70–99)
Glucose-Capillary: 100 mg/dL — ABNORMAL HIGH (ref 70–99)
Glucose-Capillary: 100 mg/dL — ABNORMAL HIGH (ref 70–99)
Glucose-Capillary: 123 mg/dL — ABNORMAL HIGH (ref 70–99)
Glucose-Capillary: 249 mg/dL — ABNORMAL HIGH (ref 70–99)

## 2022-07-26 LAB — HCG, SERUM, QUALITATIVE: Preg, Serum: NEGATIVE

## 2022-07-26 LAB — HEPARIN LEVEL (UNFRACTIONATED): Heparin Unfractionated: 0.28 IU/mL — ABNORMAL LOW (ref 0.30–0.70)

## 2022-07-26 LAB — HEPATITIS B SURFACE ANTIBODY, QUANTITATIVE: Hep B S AB Quant (Post): >1000 m[IU]/mL (ref >9.9)

## 2022-07-26 SURGERY — LEFT HEART CATH AND CORONARY ANGIOGRAPHY
Anesthesia: LOCAL

## 2022-07-26 MED ORDER — FENTANYL CITRATE (PF) 100 MCG/2ML IJ SOLN
INTRAMUSCULAR | Status: DC | PRN
  Administered 2022-07-26: 50 ug via INTRAVENOUS
  Administered 2022-07-26: 25 ug via INTRAVENOUS

## 2022-07-26 MED ORDER — SODIUM CHLORIDE 0.9 % IV SOLN: 250.0000 mL | INTRAVENOUS | Status: DC | PRN

## 2022-07-26 MED ORDER — ISOSORBIDE MONONITRATE ER 30 MG PO TB24
30.0000 mg | ORAL_TABLET | Freq: Every day | ORAL | Status: DC
  Administered 2022-07-26 – 2022-07-27 (×2): 30 mg via ORAL
  Filled 2022-07-26 (×2): qty 1

## 2022-07-26 MED ORDER — MIDAZOLAM HCL 2 MG/2ML IJ SOLN
INTRAMUSCULAR | Status: DC | PRN
  Administered 2022-07-26 (×2): 1 mg via INTRAVENOUS

## 2022-07-26 MED ORDER — SODIUM CHLORIDE 0.9% FLUSH: 3.0000 mL | INTRAVENOUS | Status: DC | PRN

## 2022-07-26 MED ORDER — FENTANYL CITRATE (PF) 100 MCG/2ML IJ SOLN
INTRAMUSCULAR | Status: AC
  Filled 2022-07-26: qty 2

## 2022-07-26 MED ORDER — HEPARIN (PORCINE) IN NACL 1000-0.9 UT/500ML-% IV SOLN
INTRAVENOUS | Status: AC
  Filled 2022-07-26: qty 1000

## 2022-07-26 MED ORDER — HEPARIN (PORCINE) IN NACL 1000-0.9 UT/500ML-% IV SOLN
INTRAVENOUS | Status: DC | PRN
  Administered 2022-07-26 (×2): 500 mL

## 2022-07-26 MED ORDER — CHLORHEXIDINE GLUCONATE CLOTH 2 % EX PADS: 6.0000 | MEDICATED_PAD | Freq: Every day | CUTANEOUS | Status: DC

## 2022-07-26 MED ORDER — CHLORHEXIDINE GLUCONATE CLOTH 2 % EX PADS
6.0000 | MEDICATED_PAD | Freq: Every day | CUTANEOUS | Status: DC
  Administered 2022-07-27: 6 via TOPICAL

## 2022-07-26 MED ORDER — MIDAZOLAM HCL 2 MG/2ML IJ SOLN
INTRAMUSCULAR | Status: AC
  Filled 2022-07-26: qty 2

## 2022-07-26 MED ORDER — LIDOCAINE HCL (PF) 1 % IJ SOLN
INTRAMUSCULAR | Status: AC
  Filled 2022-07-26: qty 30

## 2022-07-26 MED ORDER — SODIUM CHLORIDE 0.9% FLUSH: 3.0000 mL | Freq: Two times a day (BID) | INTRAVENOUS | Status: DC

## 2022-07-26 MED ORDER — IOHEXOL 350 MG/ML SOLN
INTRAVENOUS | Status: DC | PRN
  Administered 2022-07-26: 45 mL

## 2022-07-26 MED ORDER — LIDOCAINE HCL (PF) 1 % IJ SOLN
INTRAMUSCULAR | Status: DC | PRN
  Administered 2022-07-26: 20 mL

## 2022-07-26 SURGICAL SUPPLY — 12 items
CATH INFINITI 5 FR 3DRC (CATHETERS) IMPLANT
CATH INFINITI 5FR MULTPACK ANG (CATHETERS) IMPLANT
CLOSURE MYNX CONTROL 5F (Vascular Products) IMPLANT
KIT HEART LEFT (KITS) ×1 IMPLANT
KIT MICROPUNCTURE NIT STIFF (SHEATH) IMPLANT
MAT PREVALON FULL STRYKER (MISCELLANEOUS) IMPLANT
PACK CARDIAC CATHETERIZATION (CUSTOM PROCEDURE TRAY) ×1 IMPLANT
SHEATH PINNACLE 5F 10CM (SHEATH) IMPLANT
SHEATH PROBE COVER 6X72 (BAG) IMPLANT
TRANSDUCER W/STOPCOCK (MISCELLANEOUS) ×1 IMPLANT
TUBING CIL FLEX 10 FLL-RA (TUBING) ×1 IMPLANT
WIRE EMERALD 3MM-J .035X150CM (WIRE) IMPLANT

## 2022-07-26 NOTE — Progress Notes (Signed)
Transition of Care Department [facility]) following patient for high risk of readmission.   Patient admitted for chest pain. Cardiac cath done today.   Transition of Care Department [facility]) has reviewed patient and we will continue to monitor patient advancement through interdisciplinary progression rounds.

## 2022-07-26 NOTE — Progress Notes (Signed)
[facility] Progress Note  Subjective: pt seen in HD, no c/o's.   Vitals:   07/26/22 1112 07/26/22 1117 07/26/22 1138 07/26/22 1200  BP: 104/64 105/68 110/73 123/73  Pulse: 78 79 79 79  Resp: 13 12 17 14   Temp:   (!) 97.3 F (36.3 C)   TempSrc:   Oral   SpO2: 100% 100% 100% 99%  Weight:      Height:        Exam: Gen alert, no distress No jvd or bruits Chest clear bilat to bases RRR no MR Abd soft ntnd MS L AKA (dressed) and R AKA, no edema Neuro is alert, Ox 3 , nf    RUA AVF+bruit       Home meds include - asa, lipitor, plavix, insulin, prns/     OP HD: [place] MWF 3h 31min  400/1.5  47.8kg  2/2 bath   Hep none  RUA AVF - last HD 12/11, post wt 48.1kg - mircera 225 mcg q2, last 12/1, due 12/15 - no vdra     Assessment/ Plan: Chest pain / NSTEMI - due to ACS vs HTN urgency, per pmd and cardiology. LHC showed 2V CAD of branch vessels, no intervention needed. Per pmd/ cards.  BP/ volume - BP's low-normal to soft. Getting BB prob for #1. Close to dry wt and no vol excess on exam.  ESRD - on HD MWF. HD tomorrow.  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 Continue sensipar 90 qd. No vdra or binder per OP records.      Rob Doctor, hospital 07/26/2022, 1:24 PM   Recent Labs  Lab 07/25/22 0532 07/26/22 0654  HGB 9.8* 9.3*  ALBUMIN 2.3*  --   CALCIUM 9.5 8.4*  PHOS 6.5*  --   CREATININE 6.26* 5.21*  K 5.7* 5.0    Recent Labs  Lab 07/25/22 0532  IRON 56  TIBC 179*  FERRITIN 562*   Inpatient medications:  aspirin EC  81 mg Oral Daily   atorvastatin  40 mg Per Tube Daily   Chlorhexidine Gluconate Cloth  6 each Topical Q0600   cinacalcet  90 mg Oral Q supper   clopidogrel  75 mg Oral Daily   [START ON 07/27/2022] darbepoetin (ARANESP) injection - DIALYSIS  200 mcg Subcutaneous Q Fri-1800   insulin aspart  0-6 Units  Subcutaneous Q4H   isosorbide mononitrate  30 mg Oral Daily   metoprolol tartrate  12.5 mg Oral BID   sodium chloride flush  3 mL Intravenous Q12H   sodium chloride flush  3 mL Intravenous Q12H    sodium chloride     sodium chloride, acetaminophen, ondansetron (ZOFRAN) IV, oxyCODONE-acetaminophen, sodium chloride flush

## 2022-07-26 NOTE — Progress Notes (Signed)
Pt came back to room 5 from cath lab. Reinitiated tele. VSS. Call bell within reach.   Lavenia Atlas, RN

## 2022-07-26 NOTE — Plan of Care (Signed)
  Problem: Education: Goal: Knowledge of the prescribed therapeutic regimen will improve Outcome: Progressing Goal: Ability to verbalize activity precautions or restrictions will improve Outcome: Progressing Goal: Understanding of discharge needs will improve Outcome: Progressing   Problem: Activity: Goal: Ability to perform//tolerate increased activity and mobilize with assistive devices will improve Outcome: Progressing   Problem: Clinical Measurements: Goal: Postoperative complications will be avoided or minimized Outcome: Progressing   Problem: Self-Care: Goal: Ability to meet self-care needs will improve Outcome: Progressing   Problem: Self-Concept: Goal: Ability to maintain and perform role responsibilities to the fullest extent possible will improve Outcome: Progressing   Problem: Pain Management: Goal: Pain level will decrease with appropriate interventions Outcome: Progressing   Problem: Skin Integrity: Goal: Demonstration of wound healing without infection will improve Outcome: Progressing   Problem: Activity: Goal: Ability to tolerate increased activity will improve Outcome: Progressing   Problem: Cardiac: Goal: Ability to achieve and maintain adequate cardiovascular perfusion will improve Outcome: Progressing   Problem: Health Behavior/Discharge Planning: Goal: Ability to safely manage health-related needs after discharge will improve Outcome: Progressing   Problem: Education: Goal: Ability to describe self-care measures that may prevent or decrease complications (Diabetes Survival Skills Education) will improve Outcome: Progressing   Problem: Coping: Goal: Ability to adjust to condition or change in health will improve Outcome: Progressing   Problem: Fluid Volume: Goal: Ability to maintain a balanced intake and output will improve Outcome: Progressing   Problem: Health Behavior/Discharge Planning: Goal: Ability to identify and utilize available  resources and services will improve Outcome: Progressing Goal: Ability to manage health-related needs will improve Outcome: Progressing   Problem: Metabolic: Goal: Ability to maintain appropriate glucose levels will improve Outcome: Progressing   Problem: Nutritional: Goal: Maintenance of adequate nutrition will improve Outcome: Progressing Goal: Progress toward achieving an optimal weight will improve Outcome: Progressing   Problem: Skin Integrity: Goal: Risk for impaired skin integrity will decrease Outcome: Progressing   Problem: Activity: Goal: Ability to return to baseline activity level will improve Outcome: Progressing   Problem: Cardiovascular: Goal: Vascular access site(s) Level 0-1 will be maintained Outcome: Progressing   Problem: Health Behavior/Discharge Planning: Goal: Ability to safely manage health-related needs after discharge will improve Outcome: Progressing

## 2022-07-26 NOTE — Progress Notes (Signed)
PROGRESS NOTE  [name] SJG:[id number] DOB: 06/01/1981 DOA: 07/23/2022 PCP: Inc, [facility]   LOS: 2 days   Brief Narrative / Interim history: 41 year old female with history of ESRD on MWF dialysis, DM2, HTN, extensive history of atherosclerotic vascular disease including PAD, stroke, bilateral lower extremities AKA's comes in to the hospital with complaints of substernal chest pain that happened on Monday after regular HD.  She was able to complete dialysis and chest pain started  right afterwards.  EKG done on the way to the ER was concerning for STEMI, code STEMI was activated but was cleared by cardiology and admitted to the hospitalist service with cardiology consultation.  Currently refusing cath.    Subjective / 24h Interval events: Headed to cath this AM  Assesement and Plan: Principal Problem:   Chest pain due to CAD [facility]) Active Problems:   Hypertensive urgency   End stage renal disease ([place])   HLD (hyperlipidemia)   Type 2 diabetes mellitus with proliferative retinopathy, with long-term current use of insulin (HCC)   Blindness of right eye   Sacral decubitus ulcer, stage II (HCC)   Non-ST elevation (NSTEMI) myocardial infarction ([place])   Non-STEMI -patient was admitted to the hospital with chest pain, EKG changes concerning for STEMI, and cardiology was consulted.  She was placed on heparin infusion.  High-sensitivity troponin last night was 27, and repeat today is elevated to almost 6000.  -s/p cath- medical management per cards   Hypertensive emergency -BP low-- wean off nitro as able  ESRD-nephrology consulted -HD 12/13  Sacral decubitus ulcer, stage II-wound care   Right eye blindness  Hyperlipidemia-continue statin  DM2, with hyperglycemia-continue glargine and sliding scale    Scheduled Meds:  aspirin EC  81 mg Oral Daily   atorvastatin  40 mg Per Tube Daily   Chlorhexidine Gluconate Cloth  6 each Topical Q0600    cinacalcet  90 mg Oral Q supper   clopidogrel  75 mg Oral Daily   [START ON 07/27/2022] darbepoetin (ARANESP) injection - DIALYSIS  200 mcg Subcutaneous Q Fri-1800   insulin aspart  0-6 Units Subcutaneous Q4H   isosorbide mononitrate  30 mg Oral Daily   metoprolol tartrate  12.5 mg Oral BID   sodium chloride flush  3 mL Intravenous Q12H   sodium chloride flush  3 mL Intravenous Q12H   Continuous Infusions:  sodium chloride     heparin Stopped (07/26/22 1024)   PRN Meds:.sodium chloride, acetaminophen, ondansetron (ZOFRAN) IV, oxyCODONE-acetaminophen, sodium chloride flush  Current Outpatient Medications  Medication Instructions   Accu-Chek FastClix Lancets MISC Use as instructed to check blood sugar up to TID. E11.22   atorvastatin (LIPITOR) 40 mg, Per Tube, Daily   Blood Glucose Monitoring Suppl (ACCU-CHEK GUIDE ME) w/Device KIT 1 kit, Does not apply, 3 times daily, Use to check BG at home up to 3 times daily. E11.22   cinacalcet (SENSIPAR) 90 mg, Oral, Daily   clindamycin (CLEOCIN) 300 mg, Oral, Every 12 hours   Continuous Blood Gluc Transmit (DEXCOM G6 TRANSMITTER) MISC 1 Device, Does not apply, As directed   diphenhydrAMINE (BENADRYL) 25 mg, Oral,  Once   docusate (COLACE) 100 mg, Oral, 2 times daily   dorzolamide-timolol (COSOPT) 22.3-6.8 MG/ML ophthalmic solution 1 drop, Left Eye, 2 times daily   fluconazole (DIFLUCAN) 150 mg, Oral, Every M-W-F, 1 tablet by mouth every Monday, Wednesday, and Friday for 3 administrations.   Glucagon Emergency 1 mg, Intramuscular,  Once, If BS drops below 60  glucose blood (ACCU-CHEK GUIDE) test strip Use as instructed to check blood sugar up to TID. E11.22   insulin aspart (NOVOLOG) 0-6 Units, Subcutaneous, Every 4 hours   insulin glargine (SEMGLEE) 30 Units, Subcutaneous, Daily-1800   Insulin Pen Needle 32G X 4 MM MISC 1 Device, Does not apply, As directed   midodrine (PROAMATINE) 5 mg, Oral, 3 times daily   Multiple Vitamin (MULTIVITAMIN  WITH MINERALS) TABS tablet 1 tablet, Oral, Daily   NON FORMULARY 30 mLs, Per Tube, Daily, ProHeal   Nutritional Supplements (NUTRITIONAL DRINK) LIQD 120 mLs, Oral, Daily   nystatin (MYCOSTATIN) 500,000 Units, Oral, 4 times daily   ondansetron (ZOFRAN) 4 mg, Oral, Every 6 hours PRN   oxyCODONE-acetaminophen (PERCOCET) 5-325 MG tablet 1 tablet, Oral, Every 8 hours PRN   polyethylene glycol (MIRALAX / GLYCOLAX) 17 g, Oral, Daily-1800   predniSONE (DELTASONE) 40 mg, Oral,  Once   Probiotic, Lactobacillus, CAPS 1 capsule, Oral, 2 times daily   sodium chloride flush (NS) 0.9 % SOLN 10 mLs, Daily at bedtime   thiamine (VITAMIN B1) 100 mg, Per Tube, Daily    Diet Orders (From admission, onward)     Start     Ordered   07/26/22 1134  Diet renal/carb modified with fluid restriction Diet-HS Snack? Nothing; Fluid restriction: 1200 mL Fluid; Room service appropriate? Yes; Fluid consistency: Thin  Diet effective now       Question Answer Comment  Diet-HS Snack? Nothing   Fluid restriction: 1200 mL Fluid   Room service appropriate? Yes   Fluid consistency: Thin      07/26/22 1133            DVT prophylaxis:    Lab Results  Component Value Date   PLT 205 07/26/2022      Code Status: Full Code  Family Communication: no family at bedside  Status is: Inpatient  Level of care: Progressive  Consultants:  Cardiology Nephrology   Objective: Vitals:   07/26/22 1102 07/26/22 1107 07/26/22 1112 07/26/22 1117  BP: 99/65 105/65 104/64 105/68  Pulse: 77 80 78 79  Resp: _0 Temp:      TempSrc:      SpO2: 100% 100% 100% 100%  Weight:      Height:        Intake/Output Summary (Last 24 hours) at 07/26/2022 1147 Last data filed at 07/26/2022 0342 Gross per 24 hour  Intake 443.88 ml  Output 0 ml  Net 443.88 ml   Wt Readings from Last 3 Encounters:  07/26/22 83 kg  07/04/22 48.1 kg  04/06/22 59.4 kg    Examination:  General: Appearance:    Well developed, well  nourished female in no acute distress     Lungs:      respirations unlabored  Heart:    Normal heart rate. Normal rhythm. No murmurs, rubs, or gallops.   MS:   B/l amputation  Neurologic:   Awake, alert     Data Reviewed: I have independently reviewed following labs and imaging studies   CBC Recent Labs  Lab 07/23/22 2120 07/24/22 0310 07/25/22 0532 07/26/22 0654  WBC 6.6 7.3 9.3 9.3  HGB 10.9* 10.5* 9.8* 9.3*  HCT 36.2 34.9* 32.5* 30.6*  PLT 226 215 226 205  MCV 86.2 86.6 85.3 84.8  MCH 26.0 26.1 25.7* 25.8*  MCHC 30.1 30.1 30.2 30.4  RDW 21.2* 21.0* 20.4* 20.2*    Recent Labs  Lab 07/23/22 2120 07/24/22 0310 07/25/22 0532 07/26/22 0654  NA 137 139 137 136  K 4.9 5.1 5.7* 5.0  CL 97* 99 97* 94*  CO2 _0 GLUCOSE 271* 319* 67* 122*  BUN 18 25* 41* 30*  CREATININE 4.17* 4.66* 6.26* 5.21*  CALCIUM 8.9 9.0 9.5 8.4*  AST  --   --  114*  --   ALT  --   --  15  --   ALKPHOS  --   --  61  --   BILITOT  --   --  0.4  --   ALBUMIN  --   --  2.3*  --   MG  --   --  2.0  --     ------------------------------------------------------------------------------------------------------------------ Recent Labs    07/24/22 0310  CHOL 148  HDL 50  LDLCALC 72  TRIG 130  CHOLHDL 3.0    Lab Results  Component Value Date   HGBA1C 5.1 06/28/2022   ------------------------------------------------------------------------------------------------------------------ No results for input(s): "TSH", "T4TOTAL", "T3FREE", "THYROIDAB" in the last 72 hours.  Invalid input(s): "FREET3"  Cardiac Enzymes No results for input(s): "CKMB", "TROPONINI", "MYOGLOBIN" in the last 168 hours.  Invalid input(s): "CK" ------------------------------------------------------------------------------------------------------------------ No results found for: "BNP"  CBG: Recent Labs  Lab 07/25/22 1637 07/25/22 2017 07/25/22 2351 07/26/22 0408 07/26/22 0806  GLUCAP 89 97 137* 155*  109*    No results found for this or any previous visit (from the past 240 hour(s)).   Radiology Studies: CARDIAC CATHETERIZATION  Result Date: 07/26/2022   1st Diag lesion is 80% stenosed.   Lat 1st Mrg lesion is 99% stenosed.   LV end diastolic pressure is normal. 2 vessel obstructive CAD. The culprit lesion is subtotal occlusion of a branch of the first OM with thrombus. There is 80% stenosis in the first diagonal. Normal LVEDP Plan: medical management. OM branch is too small for PCI and has diffuse thrombosis.    Eulogio Bear DO [facility]  Between 7 am - 7 pm I am available, please contact me via Amion (for emergencies) or Securechat (non urgent messages)  Between 7 pm - 7 am I am not available, please contact night coverage MD/APP via Amion

## 2022-07-26 NOTE — Progress Notes (Signed)
Rounding Note    Patient Name: [name] Date of Encounter: 07/26/2022  [place] Cardiologist: None   Subjective   Chest pain overnight. She is on a NTG drip with no chest pain today.   Inpatient Medications    Scheduled Meds:  aspirin EC  81 mg Oral Daily   atorvastatin  40 mg Per Tube Daily   Chlorhexidine Gluconate Cloth  6 each Topical Q0600   cinacalcet  90 mg Oral Q supper   clopidogrel  75 mg Oral Daily   [START ON 07/27/2022] darbepoetin (ARANESP) injection - DIALYSIS  200 mcg Subcutaneous Q Fri-1800   insulin aspart  0-6 Units Subcutaneous Q4H   metoprolol tartrate  12.5 mg Oral BID   sodium chloride flush  3 mL Intravenous Q12H   Continuous Infusions:  sodium chloride     sodium chloride     heparin 900 Units/hr (07/25/22 1934)   nitroGLYCERIN 30 mcg/min (07/26/22 0008)   PRN Meds: sodium chloride, acetaminophen, ondansetron (ZOFRAN) IV, oxyCODONE-acetaminophen, sodium chloride flush   Vital Signs    Vitals:   07/25/22 2014 07/25/22 2346 07/26/22 0340 07/26/22 0409  BP: 109/72 (!) 141/93 128/74   Pulse: 85 87 87   Resp: 12 20 15    Temp: 98.9 F (37.2 C) 99.3 F (37.4 C) 99.3 F (37.4 C)   TempSrc: Oral Oral Oral   SpO2: 100% 98% 98%   Weight:    83 kg  Height:        Intake/Output Summary (Last 24 hours) at 07/26/2022 0608 Last data filed at 07/26/2022 0342 Gross per 24 hour  Intake 443.88 ml  Output 2.4 ml  Net 441.48 ml      07/26/2022    4:09 AM 07/23/2022    9:00 PM 07/23/2022    8:00 PM  Last 3 Weights  Weight (lbs) 183 lb 106 lb 0.7 oz 106 lb  Weight (kg) 83.008 kg 48.1 kg 48.081 kg      Telemetry    sinus - Personally Reviewed  ECG    EKG yesterday with sinus with inferolateral ST abnormality  Physical Exam   General: Well developed, well nourished, NAD  HEENT: OP clear, mucus membranes moist  SKIN: warm, dry. No rashes. Neuro: No focal deficits  Musculoskeletal: Muscle strength 5/5 all ext   Psychiatric: Mood and affect normal  Neck: No JVD Lungs:Clear bilaterally, no wheezes, rhonci, crackles Cardiovascular: Regular rate and rhythm. No murmurs, gallops or rubs. Abdomen:Soft. Bowel sounds present. Non-tender.  Extremities: Bilateral AKA  Labs    High Sensitivity Troponin:   Recent Labs  Lab 07/23/22 2120 07/24/22 1220  TROPONINIHS 27* 5,860*     Chemistry Recent Labs  Lab 07/23/22 2120 07/24/22 0310 07/25/22 0532  NA 137 139 137  K 4.9 5.1 5.7*  CL 97* 99 97*  CO2 28 28 29   GLUCOSE 271* 319* 67*  BUN 18 25* 41*  CREATININE 4.17* 4.66* 6.26*  CALCIUM 8.9 9.0 9.5  MG  --   --  2.0  PROT  --   --  6.0*  ALBUMIN  --   --  2.3*  AST  --   --  114*  ALT  --   --  15  ALKPHOS  --   --  61  BILITOT  --   --  0.4  GFRNONAA 13* 12* 8*  ANIONGAP 12 12 11     Lipids  Recent Labs  Lab 07/24/22 0310  CHOL 148  TRIG 130  HDL 50  LDLCALC 72  CHOLHDL 3.0    Hematology Recent Labs  Lab 07/23/22 2120 07/24/22 0310 07/25/22 0532  WBC 6.6 7.3 9.3  RBC 4.20 4.03 3.81*  HGB 10.9* 10.5* 9.8*  HCT 36.2 34.9* 32.5*  MCV 86.2 86.6 85.3  MCH 26.0 26.1 25.7*  MCHC 30.1 30.1 30.2  RDW 21.2* 21.0* 20.4*  PLT 226 215 226   Thyroid No results for input(s): "TSH", "FREET4" in the last 168 hours.  BNPNo results for input(s): "BNP", "PROBNP" in the last 168 hours.  DDimer No results for input(s): "DDIMER" in the last 168 hours.   Radiology     Cardiac Studies   Echo: 07/24/2022  IMPRESSIONS   1. Left ventricular ejection fraction, by estimation, is 60 to 65%. The  left ventricle has normal function. The left ventricle has no regional  wall motion abnormalities. There is mild concentric left ventricular  hypertrophy. Left ventricular diastolic  parameters are consistent with Grade II diastolic dysfunction  (pseudonormalization).   2. Right ventricular systolic function is normal. The right ventricular  size is normal.   3. The mitral valve is abnormal.  No evidence of mitral valve  regurgitation. No evidence of mitral stenosis.   4. The aortic valve was not well visualized. There is moderate  calcification of the aortic valve. Aortic valve regurgitation is not  visualized. No aortic stenosis is present.   5. The inferior vena cava is normal in size with greater than 50%  respiratory variability, suggesting right atrial pressure of 3 mmHg.   FINDINGS   Left Ventricle: Left ventricular ejection fraction, by estimation, is 60  to 65%. The left ventricle has normal function. The left ventricle has no  regional wall motion abnormalities. 3D left ventricular ejection fraction  analysis performed but not  reported based on interpreter judgement due to suboptimal tracking. The  left ventricular internal cavity size was small. There is mild concentric  left ventricular hypertrophy. Left ventricular diastolic parameters are  consistent with Grade II diastolic  dysfunction (pseudonormalization).   Right Ventricle: The right ventricular size is normal. No increase in  right ventricular wall thickness. Right ventricular systolic function is  normal.   Left Atrium: Left atrial size was normal in size.   Right Atrium: Right atrial size was normal in size.   Pericardium: There is no evidence of pericardial effusion.   Mitral Valve: The mitral valve is abnormal. No evidence of mitral valve  regurgitation. No evidence of mitral valve stenosis.   Tricuspid Valve: The tricuspid valve is normal in structure. Tricuspid  valve regurgitation is not demonstrated.   Aortic Valve: The aortic valve was not well visualized. There is moderate  calcification of the aortic valve. Aortic valve regurgitation is not  visualized. No aortic stenosis is present.   Pulmonic Valve: The pulmonic valve was not well visualized. Pulmonic valve  regurgitation is not visualized.   Aorta: The aortic root is normal in size and structure.   Venous: The inferior vena  cava is normal in size with greater than 50%  respiratory variability, suggesting right atrial pressure of 3 mmHg.   IAS/Shunts: No atrial level shunt detected by color flow Doppler.       Patient Profile     41 y.o. female with history of ESRD on HD, Severe PAD s/p bilateral AKA, DM admitted with chest pain and abnormal EKG. Subtle inferior ST elevation on first EKG. Code STEMI cancelled by Dr. Tamala Julian. Her chest pain resolved by the  time she arrived in the ED   Assessment & Plan    Chest pain/NSTEMI: Initially presented with chest pain and EKG concerning for STEMI. Evaluated the ED by Dr. Tamala Julian, CODE STEMI canceled with rec's for IV heparin, nitro and loaded with plavix as she was pain free. Follow up troponin up to 5860. We recommended cardiac cath on 07/25/22 but she refused. Echo showed LVEF of 60-65%, no rWMA noted.  -She is agreeable to proceed with cardiac cath today. She is not a candidate for CABG if she does have multivessel disease -Continue ASA, Plavix and statin.  -Continue IV heparin and IV NTG while awaiting cath  Shared Decision Making/Informed Consent{ The risks [stroke (1 in 1000), death (1 in 1000), kidney failure [usually temporary] (1 in 500), bleeding (1 in 200), allergic reaction [possibly serious] (1 in 200)], benefits (diagnostic support and management of coronary artery disease) and alternatives of a cardiac catheterization were discussed in detail with Ms. Garinger and she is willing to proceed.  ESRD on HD: HD on Wednesday. Nephrology following   Severe PAD s/p bilateral AKA  For questions or updates, please contact [place] Please consult [email or web address] for contact info under     Signed, Lauree Chandler, MD , [facility] 07/26/2022, 6:08 AM

## 2022-07-26 NOTE — H&P (View-Only) (Signed)
Rounding Note    Patient Name: [name] Date of Encounter: 07/26/2022  [place] Cardiologist: None   Subjective   Chest pain overnight. She is on a NTG drip with no chest pain today.   Inpatient Medications    Scheduled Meds:  aspirin EC  81 mg Oral Daily   atorvastatin  40 mg Per Tube Daily   Chlorhexidine Gluconate Cloth  6 each Topical Q0600   cinacalcet  90 mg Oral Q supper   clopidogrel  75 mg Oral Daily   [START ON 07/27/2022] darbepoetin (ARANESP) injection - DIALYSIS  200 mcg Subcutaneous Q Fri-1800   insulin aspart  0-6 Units Subcutaneous Q4H   metoprolol tartrate  12.5 mg Oral BID   sodium chloride flush  3 mL Intravenous Q12H   Continuous Infusions:  sodium chloride     sodium chloride     heparin 900 Units/hr (07/25/22 1934)   nitroGLYCERIN 30 mcg/min (07/26/22 0008)   PRN Meds: sodium chloride, acetaminophen, ondansetron (ZOFRAN) IV, oxyCODONE-acetaminophen, sodium chloride flush   Vital Signs    Vitals:   07/25/22 2014 07/25/22 2346 07/26/22 0340 07/26/22 0409  BP: 109/72 (!) 141/93 128/74   Pulse: 85 87 87   Resp: 12 20 15    Temp: 98.9 F (37.2 C) 99.3 F (37.4 C) 99.3 F (37.4 C)   TempSrc: Oral Oral Oral   SpO2: 100% 98% 98%   Weight:    83 kg  Height:        Intake/Output Summary (Last 24 hours) at 07/26/2022 0608 Last data filed at 07/26/2022 0342 Gross per 24 hour  Intake 443.88 ml  Output 2.4 ml  Net 441.48 ml      07/26/2022    4:09 AM 07/23/2022    9:00 PM 07/23/2022    8:00 PM  Last 3 Weights  Weight (lbs) 183 lb 106 lb 0.7 oz 106 lb  Weight (kg) 83.008 kg 48.1 kg 48.081 kg      Telemetry    sinus - Personally Reviewed  ECG    EKG yesterday with sinus with inferolateral ST abnormality  Physical Exam   General: Well developed, well nourished, NAD  HEENT: OP clear, mucus membranes moist  SKIN: warm, dry. No rashes. Neuro: No focal deficits  Musculoskeletal: Muscle strength 5/5 all ext   Psychiatric: Mood and affect normal  Neck: No JVD Lungs:Clear bilaterally, no wheezes, rhonci, crackles Cardiovascular: Regular rate and rhythm. No murmurs, gallops or rubs. Abdomen:Soft. Bowel sounds present. Non-tender.  Extremities: Bilateral AKA  Labs    High Sensitivity Troponin:   Recent Labs  Lab 07/23/22 2120 07/24/22 1220  TROPONINIHS 27* 5,860*     Chemistry Recent Labs  Lab 07/23/22 2120 07/24/22 0310 07/25/22 0532  NA 137 139 137  K 4.9 5.1 5.7*  CL 97* 99 97*  CO2 28 28 29   GLUCOSE 271* 319* 67*  BUN 18 25* 41*  CREATININE 4.17* 4.66* 6.26*  CALCIUM 8.9 9.0 9.5  MG  --   --  2.0  PROT  --   --  6.0*  ALBUMIN  --   --  2.3*  AST  --   --  114*  ALT  --   --  15  ALKPHOS  --   --  61  BILITOT  --   --  0.4  GFRNONAA 13* 12* 8*  ANIONGAP 12 12 11     Lipids  Recent Labs  Lab 07/24/22 0310  CHOL 148  TRIG 130  HDL 50  LDLCALC 72  CHOLHDL 3.0    Hematology Recent Labs  Lab 07/23/22 2120 07/24/22 0310 07/25/22 0532  WBC 6.6 7.3 9.3  RBC 4.20 4.03 3.81*  HGB 10.9* 10.5* 9.8*  HCT 36.2 34.9* 32.5*  MCV 86.2 86.6 85.3  MCH 26.0 26.1 25.7*  MCHC 30.1 30.1 30.2  RDW 21.2* 21.0* 20.4*  PLT 226 215 226   Thyroid No results for input(s): "TSH", "FREET4" in the last 168 hours.  BNPNo results for input(s): "BNP", "PROBNP" in the last 168 hours.  DDimer No results for input(s): "DDIMER" in the last 168 hours.   Radiology     Cardiac Studies   Echo: 07/24/2022  IMPRESSIONS   1. Left ventricular ejection fraction, by estimation, is 60 to 65%. The  left ventricle has normal function. The left ventricle has no regional  wall motion abnormalities. There is mild concentric left ventricular  hypertrophy. Left ventricular diastolic  parameters are consistent with Grade II diastolic dysfunction  (pseudonormalization).   2. Right ventricular systolic function is normal. The right ventricular  size is normal.   3. The mitral valve is abnormal.  No evidence of mitral valve  regurgitation. No evidence of mitral stenosis.   4. The aortic valve was not well visualized. There is moderate  calcification of the aortic valve. Aortic valve regurgitation is not  visualized. No aortic stenosis is present.   5. The inferior vena cava is normal in size with greater than 50%  respiratory variability, suggesting right atrial pressure of 3 mmHg.   FINDINGS   Left Ventricle: Left ventricular ejection fraction, by estimation, is 60  to 65%. The left ventricle has normal function. The left ventricle has no  regional wall motion abnormalities. 3D left ventricular ejection fraction  analysis performed but not  reported based on interpreter judgement due to suboptimal tracking. The  left ventricular internal cavity size was small. There is mild concentric  left ventricular hypertrophy. Left ventricular diastolic parameters are  consistent with Grade II diastolic  dysfunction (pseudonormalization).   Right Ventricle: The right ventricular size is normal. No increase in  right ventricular wall thickness. Right ventricular systolic function is  normal.   Left Atrium: Left atrial size was normal in size.   Right Atrium: Right atrial size was normal in size.   Pericardium: There is no evidence of pericardial effusion.   Mitral Valve: The mitral valve is abnormal. No evidence of mitral valve  regurgitation. No evidence of mitral valve stenosis.   Tricuspid Valve: The tricuspid valve is normal in structure. Tricuspid  valve regurgitation is not demonstrated.   Aortic Valve: The aortic valve was not well visualized. There is moderate  calcification of the aortic valve. Aortic valve regurgitation is not  visualized. No aortic stenosis is present.   Pulmonic Valve: The pulmonic valve was not well visualized. Pulmonic valve  regurgitation is not visualized.   Aorta: The aortic root is normal in size and structure.   Venous: The inferior vena  cava is normal in size with greater than 50%  respiratory variability, suggesting right atrial pressure of 3 mmHg.   IAS/Shunts: No atrial level shunt detected by color flow Doppler.       Patient Profile     41 y.o. female with history of ESRD on HD, Severe PAD s/p bilateral AKA, DM admitted with chest pain and abnormal EKG. Subtle inferior ST elevation on first EKG. Code STEMI cancelled by Dr. Tamala Julian. Her chest pain resolved by the  time she arrived in the ED   Assessment & Plan    Chest pain/NSTEMI: Initially presented with chest pain and EKG concerning for STEMI. Evaluated the ED by Dr. Tamala Julian, CODE STEMI canceled with rec's for IV heparin, nitro and loaded with plavix as she was pain free. Follow up troponin up to 5860. We recommended cardiac cath on 07/25/22 but she refused. Echo showed LVEF of 60-65%, no rWMA noted.  -She is agreeable to proceed with cardiac cath today. She is not a candidate for CABG if she does have multivessel disease -Continue ASA, Plavix and statin.  -Continue IV heparin and IV NTG while awaiting cath  Shared Decision Making/Informed Consent{ The risks [stroke (1 in 1000), death (1 in 1000), kidney failure [usually temporary] (1 in 500), bleeding (1 in 200), allergic reaction [possibly serious] (1 in 200)], benefits (diagnostic support and management of coronary artery disease) and alternatives of a cardiac catheterization were discussed in detail with Ms. Lavallie and she is willing to proceed.  ESRD on HD: HD on Wednesday. Nephrology following   Severe PAD s/p bilateral AKA  For questions or updates, please contact [place] Please consult [email or web address] for contact info under     Signed, Lauree Chandler, MD , [facility] 07/26/2022, 6:08 AM

## 2022-07-26 NOTE — Plan of Care (Signed)
Problem: Education: Goal: Knowledge of the prescribed therapeutic regimen will improve 07/26/2022 2126 by Ancil Linsey, RN Outcome: Progressing 07/26/2022 2123 by Ancil Linsey, RN Outcome: Progressing Goal: Ability to verbalize activity precautions or restrictions will improve 07/26/2022 2126 by Ancil Linsey, RN Outcome: Progressing 07/26/2022 2123 by Ancil Linsey, RN Outcome: Progressing Goal: Understanding of discharge needs will improve 07/26/2022 2126 by Ancil Linsey, RN Outcome: Progressing 07/26/2022 2123 by Ancil Linsey, RN Outcome: Progressing   Problem: Activity: Goal: Ability to perform//tolerate increased activity and mobilize with assistive devices will improve 07/26/2022 2126 by Ancil Linsey, RN Outcome: Progressing 07/26/2022 2123 by Ancil Linsey, RN Outcome: Progressing   Problem: Clinical Measurements: Goal: Postoperative complications will be avoided or minimized 07/26/2022 2126 by Ancil Linsey, RN Outcome: Progressing 07/26/2022 2123 by Ancil Linsey, RN Outcome: Progressing   Problem: Self-Care: Goal: Ability to meet self-care needs will improve 07/26/2022 2126 by Ancil Linsey, RN Outcome: Progressing 07/26/2022 2123 by Ancil Linsey, RN Outcome: Progressing   Problem: Self-Concept: Goal: Ability to maintain and perform role responsibilities to the fullest extent possible will improve 07/26/2022 2126 by Ancil Linsey, RN Outcome: Progressing 07/26/2022 2123 by Ancil Linsey, RN Outcome: Progressing   Problem: Pain Management: Goal: Pain level will decrease with appropriate interventions 07/26/2022 2126 by Ancil Linsey, RN Outcome: Progressing 07/26/2022 2123 by Ancil Linsey, RN Outcome: Progressing   Problem: Skin Integrity: Goal: Demonstration of wound healing without infection will improve 07/26/2022 2126 by Ancil Linsey, RN Outcome: Progressing 07/26/2022 2123 by Ancil Linsey, RN Outcome: Progressing   Problem: Activity: Goal: Ability to tolerate increased activity will improve 07/26/2022 2126 by Ancil Linsey, RN Outcome: Progressing 07/26/2022 2123 by Ancil Linsey, RN Outcome: Progressing   Problem: Cardiac: Goal: Ability to achieve and maintain adequate cardiovascular perfusion will improve 07/26/2022 2126 by Ancil Linsey, RN Outcome: Progressing 07/26/2022 2123 by Ancil Linsey, RN Outcome: Progressing   Problem: Health Behavior/Discharge Planning: Goal: Ability to safely manage health-related needs after discharge will improve 07/26/2022 2126 by Ancil Linsey, RN Outcome: Progressing 07/26/2022 2123 by Ancil Linsey, RN Outcome: Progressing   Problem: Education: Goal: Ability to describe self-care measures that may prevent or decrease complications (Diabetes Survival Skills Education) will improve 07/26/2022 2126 by Ancil Linsey, RN Outcome: Progressing 07/26/2022 2123 by Ancil Linsey, RN Outcome: Progressing   Problem: Coping: Goal: Ability to adjust to condition or change in health will improve 07/26/2022 2126 by Ancil Linsey, RN Outcome: Progressing 07/26/2022 2123 by Ancil Linsey, RN Outcome: Progressing   Problem: Fluid Volume: Goal: Ability to maintain a balanced intake and output will improve 07/26/2022 2126 by Ancil Linsey, RN Outcome: Progressing 07/26/2022 2123 by Ancil Linsey, RN Outcome: Progressing   Problem: Health Behavior/Discharge Planning: Goal: Ability to identify and utilize available resources and services will improve 07/26/2022 2126 by Ancil Linsey, RN Outcome: Progressing 07/26/2022 2123 by Ancil Linsey, RN Outcome: Progressing Goal: Ability to manage health-related needs will improve 07/26/2022 2126 by Ancil Linsey, RN Outcome: Progressing 07/26/2022 2123 by Ancil Linsey, RN Outcome: Progressing   Problem: Metabolic: Goal: Ability to  maintain appropriate glucose levels will improve 07/26/2022 2126 by Ancil Linsey, RN Outcome: Progressing 07/26/2022 2123 by Ancil Linsey, RN Outcome: Progressing   Problem: Nutritional: Goal: Maintenance of adequate nutrition will improve 07/26/2022 2126 by Ancil Linsey, RN Outcome: Progressing 07/26/2022  2123 by Ancil Linsey, RN Outcome: Progressing Goal: Progress toward achieving an optimal weight will improve 07/26/2022 2126 by Ancil Linsey, RN Outcome: Progressing 07/26/2022 2123 by Ancil Linsey, RN Outcome: Progressing   Problem: Skin Integrity: Goal: Risk for impaired skin integrity will decrease 07/26/2022 2126 by Ancil Linsey, RN Outcome: Progressing 07/26/2022 2123 by Ancil Linsey, RN Outcome: Progressing   Problem: Activity: Goal: Ability to return to baseline activity level will improve 07/26/2022 2126 by Ancil Linsey, RN Outcome: Progressing 07/26/2022 2123 by Ancil Linsey, RN Outcome: Progressing   Problem: Cardiovascular: Goal: Vascular access site(s) Level 0-1 will be maintained 07/26/2022 2126 by Ancil Linsey, RN Outcome: Progressing 07/26/2022 2123 by Ancil Linsey, RN Outcome: Progressing   Problem: Health Behavior/Discharge Planning: Goal: Ability to safely manage health-related needs after discharge will improve 07/26/2022 2126 by Ancil Linsey, RN Outcome: Progressing 07/26/2022 2123 by Ancil Linsey, RN Outcome: Progressing

## 2022-07-26 NOTE — Interval H&P Note (Signed)
History and Physical Interval Note:  07/26/2022 10:28 AM  [name]  has presented today for surgery, with the diagnosis of chest pain.  The various methods of treatment have been discussed with the patient and family. After consideration of risks, benefits and other options for treatment, the patient has consented to  Procedure(s): LEFT HEART CATH AND CORONARY ANGIOGRAPHY (N/A) as a surgical intervention.  The patient's history has been reviewed, patient examined, no change in status, stable for surgery.  I have reviewed the patient's chart and labs.  Questions were answered to the patient's satisfaction.    Cath Lab Visit (complete for each Cath Lab visit)  Clinical Evaluation Leading to the Procedure:   ACS: Yes.    Non-ACS:    Anginal Classification: CCS IV  Anti-ischemic medical therapy: Minimal Therapy (1 class of medications)  Non-Invasive Test Results: No non-invasive testing performed  Prior CABG: No previous CABG       [facility] 07/26/2022 10:28 AM

## 2022-07-26 NOTE — Progress Notes (Signed)
ANTICOAGULATION CONSULT NOTE  Pharmacy Consult for Heparin Infusion Indication: chest pain/ACS  Allergies  Allergen Reactions   Morphine Rash and Other (See Comments)   Peanut-Containing Drug Products Anaphylaxis and Hives   Penicillins Rash and Other (See Comments)    Rash in 2008.  Tolerated cefazolin in 2020   Chocolate Hives   Shellfish Allergy Swelling    Patient Measurements: Height: _0  (154.9 cm) Weight: 83 kg (183 lb) IBW/kg (Calculated) : 47.8 Heparin Dosing Weight: 48.1 kg  Vital Signs: Temp: 98.8 F (37.1 C) (12/14 0806) Temp Source: Oral (12/14 0806) BP: 106/66 (12/14 0806) Pulse Rate: 84 (12/14 0806)  Labs: Recent Labs    07/23/22 2120 07/24/22 0310 07/24/22 1200 07/24/22 1220 07/24/22 1816 07/25/22 0459 07/25/22 0532 07/25/22 1806 07/26/22 0654  HGB 10.9* 10.5*  --   --   --   --  9.8*  --  9.3*  HCT 36.2 34.9*  --   --   --   --  32.5*  --  30.6*  PLT 226 215  --   --   --   --  226  --  205  HEPARINUNFRC  --  0.13*   < >  --    < > 0.12*  --  0.20* 0.28*  CREATININE 4.17* 4.66*  --   --   --   --  6.26*  --  5.21*  TROPONINIHS 27*  --   --  5,860*  --   --   --   --   --    < > = values in this interval not displayed.     Estimated Creatinine Clearance: 14 mL/min (A) (by C-G formula based on SCr of 5.21 mg/dL (H)).   Medical History: Past Medical History:  Diagnosis Date   Anemia    Anxiety    Blind right eye 2008   Diabetes mellitus without complication ([place])    Embolic stroke ([place])    ESRD (end stage renal disease) ([place])    HD on M/W/F   ESRD on hemodialysis ([place])    GBS (Guillain Barre syndrome) ([facility])     Medications:  Medications Prior to Admission  Medication Sig Dispense Refill Last Dose   cinacalcet (SENSIPAR) 90 MG tablet Take 90 mg by mouth daily.   07/23/2022   clindamycin (CLEOCIN) 300 MG capsule Take 300 mg by mouth every 12 (twelve) hours.   07/23/2022 at 2000   diphenhydrAMINE (BENADRYL) 25 mg capsule Take 25 mg  by mouth once.   07/19/2022   docusate (COLACE) 50 MG/5ML liquid Take 100 mg by mouth 2 (two) times daily.   07/23/2022   dorzolamide-timolol (COSOPT) 22.3-6.8 MG/ML ophthalmic solution Place 1 drop into the left eye 2 (two) times daily.   07/23/2022   fluconazole (DIFLUCAN) 150 MG tablet Take 150 mg by mouth every Monday, Wednesday, and Friday. 1 tablet by mouth every Monday, Wednesday, and Friday for 3 administrations.   07/16/2022   Glucagon, rDNA, (GLUCAGON EMERGENCY) 1 MG KIT Inject 1 mg into the muscle once. If BS drops below 60   unknown   insulin aspart (NOVOLOG) 100 UNIT/ML injection Inject 0-6 Units into the skin every 4 (four) hours. (Patient taking differently: Inject 0-10 Units into the skin 3 (three) times daily with meals. 121-150=1 unit 152-200=2 units 201-250=3 units 251-300=5 units 301-350=7 units 351-400=10 units) 10 mL 11 07/22/2022   insulin glargine (SEMGLEE) 100 UNIT/ML injection Inject 30 Units into the skin daily at 6 PM.  07/22/2022   midodrine (PROAMATINE) 5 MG tablet Take 5 mg by mouth 3 (three) times daily.   07/23/2022   Multiple Vitamin (MULTIVITAMIN WITH MINERALS) TABS tablet Take 1 tablet by mouth daily.   07/23/2022   NON FORMULARY Place 30 mLs into feeding tube daily. [facility]   07/18/2022   Nutritional Supplements (NUTRITIONAL DRINK) LIQD Take 120 mLs by mouth daily.   07/23/2022   ondansetron (ZOFRAN) 4 MG tablet Take 4 mg by mouth every 6 (six) hours as needed for nausea/vomiting.   unknown   oxyCODONE-acetaminophen (PERCOCET) 5-325 MG tablet Take 1 tablet by mouth every 8 (eight) hours as needed for severe pain.   07/23/2022   polyethylene glycol (MIRALAX / GLYCOLAX) 17 g packet Take 17 g by mouth daily at 6 PM.   07/23/2022   predniSONE (DELTASONE) 20 MG tablet Take 40 mg by mouth once.   07/19/2022   Probiotic, Lactobacillus, CAPS Take 1 capsule by mouth in the morning and at bedtime.   07/23/2022   thiamine 100 MG tablet Place 1 tablet (100 mg total) into  feeding tube daily.   07/23/2022   Accu-Chek FastClix Lancets MISC Use as instructed to check blood sugar up to TID. E11.22 (Patient taking differently: 1 each by Other route See admin instructions. Use as instructed to check blood sugar up to TID. E11.22) 102 each 11    atorvastatin (LIPITOR) 40 MG tablet Place 1 tablet (40 mg total) into feeding tube daily. (Patient not taking: Reported on 07/24/2022)   Not Taking   Blood Glucose Monitoring Suppl (ACCU-CHEK GUIDE ME) w/Device KIT 1 kit by Does not apply route 3 (three) times daily. Use to check BG at home up to 3 times daily. E11.22 1 kit 0    Continuous Blood Gluc Transmit (DEXCOM G6 TRANSMITTER) MISC 1 Device by Does not apply route as directed. 1 each 3    glucose blood (ACCU-CHEK GUIDE) test strip Use as instructed to check blood sugar up to TID. E11.22 (Patient taking differently: 1 each by Other route See admin instructions. Use as instructed to check blood sugar up to TID. E11.22) 100 each 12    Insulin Pen Needle 32G X 4 MM MISC 1 Device by Does not apply route as directed. 150 each 11    nystatin (MYCOSTATIN) 100000 UNIT/ML suspension Take 5 mLs (500,000 Units total) by mouth 4 (four) times daily. 60 mL 0    sodium chloride flush (NS) 0.9 % SOLN Inject 10 mLs into the vein at bedtime. (Patient not taking: Reported on 07/24/2022)   Not Taking    Assessment: 41 yo F BIB EMS for Code STEMI. Pt received aspirin 338m x 1 with heparin 2900 units (~60 units/kg) IV bolus in ED. Code STEMI cancelled, determined to be NSTEMI. Pt is not taking anticoagulation prior to admission. Pharmacy consulted to dose heparin infusion per ACS protocol.   Heparin level came back just slightly subtherapeutic at 0.28, on 900 units/hr. Hgb 9.3, plt 205. No s/sx of bleeding or infusion issues per RN. Plan for cath later today.   Goal of Therapy:  Heparin level 0.3-0.7 units/ml Monitor platelets by anticoagulation protocol: Yes   Plan:  Increase heparin infusion  rate to 950 units/hr Monitor daily CBC, heparin level, and for s/sx of bleeding Will follow up after cath today   KAntonietta Jewel PharmD, BTall TimberPharmacist  Phone: [phone]/14/2023 8:29 AM  Please check AMION for all [place] Centerphone numbers After 10:00 PM, call [place]  [phone]

## 2022-07-26 NOTE — Progress Notes (Signed)
Refused to watch Cath Video at this time.

## 2022-07-27 DIAGNOSIS — I25119 Atherosclerotic heart disease of native coronary artery with unspecified angina pectoris: Secondary | ICD-10-CM | POA: Diagnosis not present

## 2022-07-27 LAB — RENAL FUNCTION PANEL
Albumin: 2.2 g/dL — ABNORMAL LOW (ref 3.5–5.0)
Anion gap: 12 (ref 5–15)
BUN: 51 mg/dL — ABNORMAL HIGH (ref 6–20)
CO2: 26 mmol/L (ref 22–32)
Calcium: 7.4 mg/dL — ABNORMAL LOW (ref 8.9–10.3)
Chloride: 96 mmol/L — ABNORMAL LOW (ref 98–111)
Creatinine, Ser: 7.02 mg/dL — ABNORMAL HIGH (ref 0.44–1.00)
GFR, Estimated: 7 mL/min — ABNORMAL LOW (ref >60)
Glucose, Bld: 82 mg/dL (ref 70–99)
Phosphorus: 6.3 mg/dL — ABNORMAL HIGH (ref 2.5–4.6)
Potassium: 5.6 mmol/L — ABNORMAL HIGH (ref 3.5–5.1)
Sodium: 134 mmol/L — ABNORMAL LOW (ref 135–145)

## 2022-07-27 LAB — GLUCOSE, CAPILLARY
Glucose-Capillary: 100 mg/dL — ABNORMAL HIGH (ref 70–99)
Glucose-Capillary: 242 mg/dL — ABNORMAL HIGH (ref 70–99)
Glucose-Capillary: 76 mg/dL (ref 70–99)
Glucose-Capillary: 76 mg/dL (ref 70–99)
Glucose-Capillary: 84 mg/dL (ref 70–99)

## 2022-07-27 LAB — CBC
HCT: 27.6 % — ABNORMAL LOW (ref 36.0–46.0)
Hemoglobin: 8.4 g/dL — ABNORMAL LOW (ref 12.0–15.0)
MCH: 25.8 pg — ABNORMAL LOW (ref 26.0–34.0)
MCHC: 30.4 g/dL (ref 30.0–36.0)
MCV: 84.7 fL (ref 80.0–100.0)
Platelets: 205 10*3/uL (ref 150–400)
RBC: 3.26 MIL/uL — ABNORMAL LOW (ref 3.87–5.11)
RDW: 19.3 % — ABNORMAL HIGH (ref 11.5–15.5)
WBC: 9.4 10*3/uL (ref 4.0–10.5)
nRBC: 0 % (ref 0.0–0.2)

## 2022-07-27 MED ORDER — ASPIRIN 81 MG PO TBEC: 81.0000 mg | DELAYED_RELEASE_TABLET | Freq: Every day | ORAL | 12 refills | Status: DC

## 2022-07-27 MED ORDER — OXYCODONE-ACETAMINOPHEN 5-325 MG PO TABS: 1.0000 | ORAL_TABLET | Freq: Three times a day (TID) | ORAL | 0 refills | Status: DC | PRN

## 2022-07-27 MED ORDER — METOPROLOL TARTRATE 25 MG PO TABS: 12.5000 mg | ORAL_TABLET | Freq: Two times a day (BID) | ORAL | Status: DC

## 2022-07-27 MED ORDER — MIDODRINE HCL 5 MG PO TABS: 5.0000 mg | ORAL_TABLET | Freq: Three times a day (TID) | ORAL | Status: DC | PRN

## 2022-07-27 MED ORDER — ISOSORBIDE MONONITRATE ER 30 MG PO TB24: 30.0000 mg | ORAL_TABLET | Freq: Every day | ORAL | Status: DC

## 2022-07-27 MED ORDER — CLOPIDOGREL BISULFATE 75 MG PO TABS: 75.0000 mg | ORAL_TABLET | Freq: Every day | ORAL | Status: DC

## 2022-07-27 NOTE — TOC Transition Note (Signed)
Transition of Care [facility]) - CM/SW Discharge Note   Patient Details  Name: [name] MRN: [id number] Date of Birth: September 11, 1980  [facility] CM/SW Contact:  Vinie Sill, LCSW Phone Number: 07/27/2022, 4:00 PM   Clinical Narrative:     Patient will Discharge to: [facility] Discharge Date:07/27/2022 Family Notified: attempted to contact sister & spouse Transport QP:RFFM @ 9pm  Per MD patient is ready for discharge. RN, patient, and facility notified of discharge. Discharge Summary sent to facility. RN given number for [phone]. Ambulance transport requested for patient.   Clinical Social Worker signing off.  Thurmond Butts, MSW, LCSW Clinical Social Worker     Final next level of care: Skilled Nursing Facility Barriers to Discharge: Barriers Resolved   Patient Goals and CMS Choice        Discharge Placement              Patient chooses bed at:  [facility]) Patient to be transferred to facility by: [facility] Name of family member notified: unable toleave message with sister , unable to leave vm on spouse phone Patient and family notified of of transfer: 07/27/22  Discharge Plan and Services In-house Referral: Clinical Social Work                                   Social Determinants of Health (SDOH) Interventions     Readmission Risk Interventions    07/04/2022   10:49 AM 08/23/2021    3:42 PM  Readmission Risk Prevention Plan  Transportation Screening Complete Complete  HRI or Home Care Consult  Complete  Social Work Consult for [place] Planning/Counseling  Complete  Palliative Care Screening  Not Applicable  Medication Review Press photographer) Complete Complete  PCP or Specialist appointment within 3-5 days of discharge Complete   HRI or [place] Complete   SW Recovery Care/Counseling Consult Complete   Palliative Care Screening Not [place] Complete

## 2022-07-27 NOTE — Discharge Summary (Signed)
Physician Discharge Summary  [name] VZD:[id number] DOB: 1980/09/18 DOA: 07/23/2022  PCP: [facility]  Admit date: 07/23/2022 Discharge date: 07/27/2022  Admitted From: rehab Discharge disposition: rehab   Recommendations for Outpatient Follow-Up:   Per cards: Plans for medical management of CAD. Will continue ASA, Plavix, Imdur, beta blocker and statin.    Discharge Diagnosis:   Principal Problem:   Chest pain due to CAD [facility]) Active Problems:   Hypertensive urgency   End stage renal disease ([place])   HLD (hyperlipidemia)   Type 2 diabetes mellitus with proliferative retinopathy, with long-term current use of insulin (HCC)   Blindness of right eye   Sacral decubitus ulcer, stage II ([place])   Non-ST elevation (NSTEMI) myocardial infarction [facility]    Discharge Condition: Improved.  Diet recommendation: renal/carb mod  Wound care: None.  Code status: Full.   History of Present Illness:   41 year old female with history of ESRD on MWF dialysis, DM2, HTN, extensive history of atherosclerotic vascular disease including PAD, stroke, bilateral lower extremities AKA's comes in to the hospital with complaints of substernal chest pain that happened on Monday after regular HD. She was able to complete dialysis and chest pain started right afterwards. EKG done on the way to the ER was concerning for STEMI, code STEMI was activated but was cleared by cardiology and admitted to the hospitalist service with cardiology consultation.    Hospital Course by Problem:   Non-STEMI -patient was admitted to the hospital with chest pain, EKG changes concerning for STEMI, and cardiology was consulted.  She was placed on heparin infusion.  High-sensitivity troponin last night was 27, and repeat today is elevated to almost 6000.  -s/p cath- medical management per cards:continue ASA, Plavix, Imdur, beta blocker and statin.      Hypertensive emergency -prn  midodrine -resume meds- BP controlled   ESRD-nephrology consulted -HD 12/13   Sacral decubitus ulcer, stage II-wound care    Right eye blindness   Hyperlipidemia-continue statin   DM2, with hyperglycemia-continue glargine and sliding scale        Medical Consultants:   Renal cards   Discharge Exam:   Vitals:   07/27/22 0400 07/27/22 0815  BP: 121/70 138/83  Pulse: 90 83  Resp: 14 16  Temp:  98.5 F (36.9 C)  SpO2: 100% 100%   Vitals:   07/27/22 0200 07/27/22 0358 07/27/22 0400 07/27/22 0815  BP:  121/70 121/70 138/83  Pulse: 84 90 90 83  Resp: _0 Temp:  98.3 F (36.8 C)  98.5 F (36.9 C)  TempSrc:  Oral  Oral  SpO2: 100% 100% 100% 100%  Weight:  83.5 kg    Height:        General exam: Appears calm and comfortable.   The results of significant diagnostics from this hospitalization (including imaging, microbiology, ancillary and laboratory) are listed below for reference.     Procedures and Diagnostic Studies:   ECHOCARDIOGRAM COMPLETE  Result Date: 07/24/2022    ECHOCARDIOGRAM REPORT   Patient Name:   [name] Date of Exam: 07/24/2022 Medical Rec #:  [id number]     Height:       61.0 in Accession #:    [id number]    Weight:       106.0 lb Date of Birth:  [birth year]      BSA:          1.442 m Patient Age:    85  years      BP:           141/76 mmHg Patient Gender: F             HR:           83 bpm. Exam Location:  Inpatient Procedure: 2D Echo, 3D Echo, Cardiac Doppler and Color Doppler                                MODIFIED REPORT: This report was modified by Rudean Haskell MD on 07/24/2022 due to Typo.  Indications:     Chest Pain R07.9  History:         Patient has prior history of Echocardiogram examinations, most                  recent 10/02/2021. CAD, Stroke, Signs/Symptoms:Chest Pain; Risk                  Factors:Diabetes, Dyslipidemia, Hypertension and Non-Smoker.  Sonographer:     Greer Pickerel Referring Phys:  [id number] [employer] [place]  Diagnosing Phys: Rudean Haskell MD  Sonographer Comments: Image acquisition challenging due to patient body habitus and Image acquisition challenging due to respiratory motion. Patient not able to adjust due to pain. IMPRESSIONS  1. Left ventricular ejection fraction, by estimation, is 60 to 65%. The left ventricle has normal function. The left ventricle has no regional wall motion abnormalities. There is mild concentric left ventricular hypertrophy. Left ventricular diastolic parameters are consistent with Grade II diastolic dysfunction (pseudonormalization).  2. Right ventricular systolic function is normal. The right ventricular size is normal.  3. The mitral valve is abnormal. No evidence of mitral valve regurgitation. No evidence of mitral stenosis.  4. The aortic valve was not well visualized. There is moderate calcification of the aortic valve. Aortic valve regurgitation is not visualized. No aortic stenosis is present.  5. The inferior vena cava is normal in size with greater than 50% respiratory variability, suggesting right atrial pressure of 3 mmHg. FINDINGS  Left Ventricle: Left ventricular ejection fraction, by estimation, is 60 to 65%. The left ventricle has normal function. The left ventricle has no regional wall motion abnormalities. 3D left ventricular ejection fraction analysis performed but not reported based on interpreter judgement due to suboptimal tracking. The left ventricular internal cavity size was small. There is mild concentric left ventricular hypertrophy. Left ventricular diastolic parameters are consistent with Grade II diastolic dysfunction (pseudonormalization). Right Ventricle: The right ventricular size is normal. No increase in right ventricular wall thickness. Right ventricular systolic function is normal. Left Atrium: Left atrial size was normal in size. Right Atrium: Right atrial size was normal in size. Pericardium: There is no evidence of pericardial effusion. Mitral  Valve: The mitral valve is abnormal. No evidence of mitral valve regurgitation. No evidence of mitral valve stenosis. Tricuspid Valve: The tricuspid valve is normal in structure. Tricuspid valve regurgitation is not demonstrated. Aortic Valve: The aortic valve was not well visualized. There is moderate calcification of the aortic valve. Aortic valve regurgitation is not visualized. No aortic stenosis is present. Pulmonic Valve: The pulmonic valve was not well visualized. Pulmonic valve regurgitation is not visualized. Aorta: The aortic root is normal in size and structure. Venous: The inferior vena cava is normal in size with greater than 50% respiratory variability, suggesting right atrial pressure of 3 mmHg. IAS/Shunts: No atrial level shunt detected by color flow Doppler.  LEFT VENTRICLE PLAX 2D LVIDd:         3.70 cm   Diastology LVIDs:         2.00 cm   LV e' medial:    4.91 cm/s LV PW:         0.90 cm   LV E/e' medial:  20.6 LV IVS:        1.00 cm   LV e' lateral:   5.34 cm/s LVOT diam:     1.70 cm   LV E/e' lateral: 18.9 LV SV:         37 LV SV Index:   26 LVOT Area:     2.27 cm                           3D Volume EF:                          3D EF:        50 %                          LV EDV:       110 ml                          LV ESV:       55 ml                          LV SV:        55 ml RIGHT VENTRICLE RV S prime:     9.11 cm/s TAPSE (M-mode): 1.6 cm LEFT ATRIUM           Index        RIGHT ATRIUM           Index LA diam:      2.50 cm 1.73 cm/m   RA Area:     10.90 cm LA Vol (A2C): 29.2 ml 20.24 ml/m  RA Volume:   20.30 ml  14.07 ml/m LA Vol (A4C): 41.9 ml 29.05 ml/m  AORTIC VALVE LVOT Vmax:   84.90 cm/s LVOT Vmean:  59.600 cm/s LVOT VTI:    0.165 m  AORTA Ao Root diam: 3.10 cm MITRAL VALVE MV Area (PHT): 3.74 cm     SHUNTS MV Decel Time: 203 msec     Systemic VTI:  0.16 m MR Peak grad: 1.7 mmHg      Systemic Diam: 1.70 cm MR Vmax:      65.90 cm/s MV E velocity: 101.00 cm/s MV A velocity: 73.30  cm/s MV E/A ratio:  1.38 Rudean Haskell MD Electronically signed by Rudean Haskell MD Signature Date/Time: 07/24/2022/12:54:44 PM    Final (Updated)    DG Chest Portable 1 View  Result Date: 07/23/2022 CLINICAL DATA:  Chest pain and shortness of breath EXAM: PORTABLE CHEST 1 VIEW .  Patient is rotated COMPARISON:  Chest x-ray 02/09/2022 FINDINGS: The heart and mediastinal contours are within normal limits. No focal consolidation. No pulmonary edema. No pleural effusion. No pneumothorax. No acute osseous abnormality. IMPRESSION: No active disease. Electronically Signed   By: Iven Finn M.D.   On: 07/23/2022 21:38     Labs:   Basic Metabolic Panel: Recent Labs  Lab 07/23/22 2120 07/24/22 0310 07/25/22 0532 07/26/22 0654  NA 137 139 137 136  K 4.9 5.1 5.7* 5.0  CL 97* 99 97* 94*  CO2 _0 GLUCOSE 271* 319* 67* 122*  BUN 18 25* 41* 30*  CREATININE 4.17* 4.66* 6.26* 5.21*  CALCIUM 8.9 9.0 9.5 8.4*  MG  --   --  2.0  --   PHOS  --   --  6.5*  --    GFR Estimated Creatinine Clearance: 14.1 mL/min (A) (by C-G formula based on SCr of 5.21 mg/dL (H)). Liver Function Tests: Recent Labs  Lab 07/25/22 0532  AST 114*  ALT 15  ALKPHOS 61  BILITOT 0.4  PROT 6.0*  ALBUMIN 2.3*   No results for input(s): "LIPASE", "AMYLASE" in the last 168 hours. No results for input(s): "AMMONIA" in the last 168 hours. Coagulation profile No results for input(s): "INR", "PROTIME" in the last 168 hours.  CBC: Recent Labs  Lab 07/23/22 2120 07/24/22 0310 07/25/22 0532 07/26/22 0654  WBC 6.6 7.3 9.3 9.3  HGB 10.9* 10.5* 9.8* 9.3*  HCT 36.2 34.9* 32.5* 30.6*  MCV 86.2 86.6 85.3 84.8  PLT 226 215 226 205   Cardiac Enzymes: No results for input(s): "CKTOTAL", "CKMB", "CKMBINDEX", "TROPONINI" in the last 168 hours. BNP: Invalid input(s): "POCBNP" CBG: Recent Labs  Lab 07/26/22 1559 07/26/22 2002 07/27/22 0020 07/27/22 0403 07/27/22 0812  GLUCAP 123* 249* 242*  76 100*   D-Dimer No results for input(s): "DDIMER" in the last 72 hours. Hgb A1c No results for input(s): "HGBA1C" in the last 72 hours. Lipid Profile No results for input(s): "CHOL", "HDL", "LDLCALC", "TRIG", "CHOLHDL", "LDLDIRECT" in the last 72 hours. Thyroid function studies No results for input(s): "TSH", "T4TOTAL", "T3FREE", "THYROIDAB" in the last 72 hours.  Invalid input(s): "FREET3" Anemia work up Recent Labs    07/25/22 0532  FERRITIN 562*  TIBC 179*  IRON 56   Microbiology No results found for this or any previous visit (from the past 240 hour(s)).   Discharge Instructions:   Discharge Instructions     Discharge instructions   Complete by: As directed    Renal/carb mod diet   Discharge wound care:   Complete by: As directed    Wound care to Stage 2 partial thickness pressure injury to sacrum: cleanse with NS, pat dry. Apply xeroform gauze, top with silicone foam. Change daily and PRN soiling.   Increase activity slowly   Complete by: As directed       Allergies as of 07/27/2022       Reactions   Morphine Rash, Other (See Comments)   Peanut-containing Drug Products Anaphylaxis, Hives   Penicillins Rash, Other (See Comments)   Rash in 2008.  Tolerated cefazolin in 2020   Chocolate Hives   Shellfish Allergy Swelling        Medication List     STOP taking these medications    clindamycin 300 MG capsule Commonly known as: CLEOCIN   diphenhydrAMINE 25 mg capsule Commonly known as: BENADRYL   NON FORMULARY   predniSONE 20 MG tablet Commonly known as: DELTASONE       TAKE these medications    Accu-Chek FastClix Lancets Misc Use as instructed to check blood sugar up to TID. E11.22 What changed:  how much to take how to take this when to take this   Accu-Chek Guide Me w/Device Kit 1 kit by Does not apply route 3 (three) times daily. Use to check BG at home up to 3 times daily. E11.22   Accu-Chek Guide test strip Generic  drug:  glucose blood Use as instructed to check blood sugar up to TID. E11.22 What changed:  how much to take how to take this when to take this   aspirin EC 81 MG tablet Take 1 tablet (81 mg total) by mouth daily. Swallow whole.   atorvastatin 40 MG tablet Commonly known as: LIPITOR Place 1 tablet (40 mg total) into feeding tube daily.   cinacalcet 90 MG tablet Commonly known as: SENSIPAR Take 90 mg by mouth daily.   clopidogrel 75 MG tablet Commonly known as: PLAVIX Take 1 tablet (75 mg total) by mouth daily.   Dexcom G6 Transmitter Misc 1 Device by Does not apply route as directed.   docusate 50 MG/5ML liquid Commonly known as: COLACE Take 100 mg by mouth 2 (two) times daily.   dorzolamide-timolol 2-0.5 % ophthalmic solution Commonly known as: COSOPT Place 1 drop into the left eye 2 (two) times daily.   fluconazole 150 MG tablet Commonly known as: DIFLUCAN Take 150 mg by mouth every Monday, Wednesday, and Friday. 1 tablet by mouth every Monday, Wednesday, and Friday for 3 administrations.   Glucagon Emergency 1 MG Kit Inject 1 mg into the muscle once. If BS drops below 60   insulin aspart 100 UNIT/ML injection Commonly known as: novoLOG Inject 0-6 Units into the skin every 4 (four) hours. What changed:  how much to take when to take this additional instructions   Insulin Pen Needle 32G X 4 MM Misc 1 Device by Does not apply route as directed.   isosorbide mononitrate 30 MG 24 hr tablet Commonly known as: IMDUR Take 1 tablet (30 mg total) by mouth daily.   metoprolol tartrate 25 MG tablet Commonly known as: LOPRESSOR Take 0.5 tablets (12.5 mg total) by mouth 2 (two) times daily.   midodrine 5 MG tablet Commonly known as: PROAMATINE Take 1 tablet (5 mg total) by mouth 3 (three) times daily as needed (low blood pressure). What changed:  when to take this reasons to take this   multivitamin with minerals Tabs tablet Take 1 tablet by mouth daily.    Nutritional Drink Liqd Take 120 mLs by mouth daily.   nystatin 100000 UNIT/ML suspension Commonly known as: MYCOSTATIN Take 5 mLs (500,000 Units total) by mouth 4 (four) times daily.   ondansetron 4 MG tablet Commonly known as: ZOFRAN Take 4 mg by mouth every 6 (six) hours as needed for nausea/vomiting.   oxyCODONE-acetaminophen 5-325 MG tablet Commonly known as: Percocet Take 1 tablet by mouth every 8 (eight) hours as needed for severe pain.   polyethylene glycol 17 g packet Commonly known as: MIRALAX / GLYCOLAX Take 17 g by mouth daily at 6 PM.   Probiotic (Lactobacillus) Caps Take 1 capsule by mouth in the morning and at bedtime.   Semglee 100 UNIT/ML injection Generic drug: insulin glargine Inject 30 Units into the skin daily at 6 PM.   sodium chloride flush 0.9 % Soln Commonly known as: NS Inject 10 mLs into the vein at bedtime.   thiamine 100 MG tablet Commonly known as: VITAMIN B1 Place 1 tablet (100 mg total) into feeding tube daily.               Discharge Care Instructions  (From admission, onward)           Start     Ordered   07/27/22 0000  Discharge wound care:       Comments: Wound care to Stage 2 partial thickness pressure injury  to sacrum: cleanse with NS, pat dry. Apply xeroform gauze, top with silicone foam. Change daily and PRN soiling.   07/27/22 1036              Time coordinating discharge: 45 min  Signed:  Geradine Girt DO  [facility] 07/27/2022, 10:36 AM

## 2022-07-27 NOTE — Progress Notes (Signed)
[facility] Progress Note  Subjective: pt seen in room, no c/o.   Vitals:   07/27/22 0358 07/27/22 0400 07/27/22 0815 07/27/22 1337  BP: 121/70 121/70 138/83 131/84  Pulse: 90 90 83 82  Resp: 14 14 16 18   Temp: 98.3 F (36.8 C)  98.5 F (36.9 C) 98.5 F (36.9 C)  TempSrc: Oral  Oral Oral  SpO2: 100% 100% 100% 100%  Weight: 83.5 kg     Height:        Exam: Gen alert, no distress No jvd or bruits Chest clear bilat to bases RRR no MR Abd soft ntnd MS L AKA (dressed) and R AKA, no edema Neuro is alert, Ox 3 , nf    RUA AVF+bruit       Home meds include - asa, lipitor, plavix, insulin, prns/     OP HD: [place] MWF 3h 35min  400/1.5  47.8kg  2/2 bath   Hep none  RUA AVF - last HD 12/11, post wt 48.1kg - mircera 225 mcg q2, last 12/1, due 12/15 - no vdra     Assessment/ Plan: Chest pain / NSTEMI - due to ACS vs HTN urgency, per pmd and cardiology. LHC showed 2V CAD of branch vessels, no intervention needed. Per pmd/ cards.  BP/ volume - BP's low-normal to soft. Getting BB prob for #1. Close to dry wt and no vol excess on exam.  ESRD - on HD MWF. HD today.  PAD - sp bilat LE amputee Sacral decub stage II DM2 Blindness of R eye Anemia esrd - Hb 10-11 here, esa due on Friday, will order MBD ckd - CCa in range, phos up just a bit. Continue sensipar 90 qd. No vdra or binder per OP records.      Rob Navaya Wiatrek 07/27/2022, 4:03 PM   Recent Labs  Lab 07/25/22 0532 07/26/22 0654  HGB 9.8* 9.3*  ALBUMIN 2.3*  --   CALCIUM 9.5 8.4*  PHOS 6.5*  --   CREATININE 6.26* 5.21*  K 5.7* 5.0    Recent Labs  Lab 07/25/22 0532  IRON 56  TIBC 179*  FERRITIN 562*    Inpatient medications:  aspirin EC  81 mg Oral Daily   atorvastatin  40 mg Per Tube Daily   Chlorhexidine Gluconate Cloth  6 each Topical Q0600   cinacalcet  90 mg Oral Q supper   clopidogrel  75 mg Oral Daily   darbepoetin (ARANESP) injection - DIALYSIS  200 mcg Subcutaneous Q Fri-1800    insulin aspart  0-6 Units Subcutaneous Q4H   isosorbide mononitrate  30 mg Oral Daily   metoprolol tartrate  12.5 mg Oral BID   sodium chloride flush  3 mL Intravenous Q12H     acetaminophen, ondansetron (ZOFRAN) IV, oxyCODONE-acetaminophen

## 2022-07-27 NOTE — Care Management Important Message (Signed)
Important Message  Patient Details  Name: [name] MRN: [id number] Date of Birth: 06/11/1981   Medicare Important Message Given:  Yes     Shelda Altes 07/27/2022, 10:48 AM

## 2022-07-27 NOTE — Progress Notes (Signed)
   07/27/22 2029  Vitals  Temp 98.6 F (37 C)  Temp Source Oral  BP 119/81  MAP (mmHg) 94  BP Location Left Arm  BP Method Automatic  Patient Position (if appropriate) Lying  Pulse Rate 82  Pulse Rate Source Monitor  ECG Heart Rate 83  Resp 13  Post Treatment  Dialyzer Clearance Lightly streaked  Duration of HD Treatment -hour(s) 3.25 hour(s)  Liters Processed 68.5  Fluid Removed (mL) 1900 mL  Tolerated HD Treatment No (Comment)  Post-Hemodialysis Comments PT off TX early due demanding off that she just can'ttake it .  AVG/AVF Arterial Site Held (minutes) 5 minutes  AVG/AVF Venous Site Held (minutes) 5 minutes   TX fin. W/o difficulty.
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
## 2022-07-27 NOTE — Progress Notes (Signed)
Rounding Note    Patient Name: [name] Date of Encounter: 07/27/2022  [place] Cardiologist: None   Subjective   No chest pain today.   Inpatient Medications    Scheduled Meds:  aspirin EC  81 mg Oral Daily   atorvastatin  40 mg Per Tube Daily   Chlorhexidine Gluconate Cloth  6 each Topical Q0600   cinacalcet  90 mg Oral Q supper   clopidogrel  75 mg Oral Daily   darbepoetin (ARANESP) injection - DIALYSIS  200 mcg Subcutaneous Q Fri-1800   insulin aspart  0-6 Units Subcutaneous Q4H   isosorbide mononitrate  30 mg Oral Daily   metoprolol tartrate  12.5 mg Oral BID   sodium chloride flush  3 mL Intravenous Q12H   Continuous Infusions:   PRN Meds: acetaminophen, ondansetron (ZOFRAN) IV, oxyCODONE-acetaminophen   Vital Signs    Vitals:   07/27/22 0200 07/27/22 0358 07/27/22 0400 07/27/22 0815  BP:  121/70 121/70 138/83  Pulse: 84 90 90 83  Resp: 15 14 14 16   Temp:  98.3 F (36.8 C)  98.5 F (36.9 C)  TempSrc:  Oral  Oral  SpO2: 100% 100% 100% 100%  Weight:  83.5 kg    Height:        Intake/Output Summary (Last 24 hours) at 07/27/2022 0820 Last data filed at 07/26/2022 2000 Gross per 24 hour  Intake 1028.53 ml  Output 0 ml  Net 1028.53 ml      07/27/2022    3:58 AM 07/26/2022    4:09 AM 07/23/2022    9:00 PM  Last 3 Weights  Weight (lbs) 184 lb 183 lb 106 lb 0.7 oz  Weight (kg) 83.462 kg 83.008 kg 48.1 kg      Telemetry    Sinus - Personally Reviewed  ECG    No AM tracing  Physical Exam   General: Well developed, well nourished, NAD  HEENT: OP clear, mucus membranes moist  SKIN: warm, dry. No rashes. Neuro: No focal deficits  Psychiatric: Mood and affect normal  Neck: No JVD Lungs:Clear bilaterally, no wheezes, rhonci, crackles Cardiovascular: Regular rate and rhythm. No murmurs, gallops or rubs. Abdomen:Soft. Extremities: Bilateral AKA  Labs    High Sensitivity Troponin:   Recent Labs  Lab 07/23/22 2120  07/24/22 1220  TROPONINIHS 27* 5,860*     Chemistry Recent Labs  Lab 07/24/22 0310 07/25/22 0532 07/26/22 0654  NA 139 137 136  K 5.1 5.7* 5.0  CL 99 97* 94*  CO2 28 29 30   GLUCOSE 319* 67* 122*  BUN 25* 41* 30*  CREATININE 4.66* 6.26* 5.21*  CALCIUM 9.0 9.5 8.4*  MG  --  2.0  --   PROT  --  6.0*  --   ALBUMIN  --  2.3*  --   AST  --  114*  --   ALT  --  15  --   ALKPHOS  --  61  --   BILITOT  --  0.4  --   GFRNONAA 12* 8* 10*  ANIONGAP 12 11 12     Lipids  Recent Labs  Lab 07/24/22 0310  CHOL 148  TRIG 130  HDL 50  LDLCALC 72  CHOLHDL 3.0    Hematology Recent Labs  Lab 07/24/22 0310 07/25/22 0532 07/26/22 0654  WBC 7.3 9.3 9.3  RBC 4.03 3.81* 3.61*  HGB 10.5* 9.8* 9.3*  HCT 34.9* 32.5* 30.6*  MCV 86.6 85.3 84.8  MCH 26.1 25.7* 25.8*  MCHC  30.1 30.2 30.4  RDW 21.0* 20.4* 20.2*  PLT 215 226 205   Thyroid No results for input(s): "TSH", "FREET4" in the last 168 hours.  BNPNo results for input(s): "BNP", "PROBNP" in the last 168 hours.  DDimer No results for input(s): "DDIMER" in the last 168 hours.   Radiology     Cardiac Studies   Echo: 07/24/2022  IMPRESSIONS   1. Left ventricular ejection fraction, by estimation, is 60 to 65%. The  left ventricle has normal function. The left ventricle has no regional  wall motion abnormalities. There is mild concentric left ventricular  hypertrophy. Left ventricular diastolic  parameters are consistent with Grade II diastolic dysfunction  (pseudonormalization).   2. Right ventricular systolic function is normal. The right ventricular  size is normal.   3. The mitral valve is abnormal. No evidence of mitral valve  regurgitation. No evidence of mitral stenosis.   4. The aortic valve was not well visualized. There is moderate  calcification of the aortic valve. Aortic valve regurgitation is not  visualized. No aortic stenosis is present.   5. The inferior vena cava is normal in size with greater than 50%   respiratory variability, suggesting right atrial pressure of 3 mmHg.   Cardiac cath 07/26/22:      1st Diag lesion is 80% stenosed.   Lat 1st Mrg lesion is 99% stenosed.   LV end diastolic pressure is normal.   2 vessel obstructive CAD. The culprit lesion is subtotal occlusion of a branch of the first OM with thrombus. There is 80% stenosis in the first diagonal. Normal LVEDP   Plan: medical management. OM branch is too small for PCI and has diffuse thrombosis.   Patient Profile     41 y.o. female with history of ESRD on HD, Severe PAD s/p bilateral AKA, DM admitted with a NSTEMI. Cardiac cath 07/26/22 with thrombotic occlusion of distal segment of small OM sub-branch. Severe disease in small caliber Diagonal branch. No other obstructive disease.    Assessment & Plan    CAD/NSTEMI: No chest pain today. Echo showed LVEF of 60-65%, no rWMA noted. Cardiac cath with culprit lesion small sub branch of OM with thrombotic occlusion. This was not a vessel that could be treated with PCI due to small size. Plans for medical management of CAD. Will continue ASA, Plavix, Imdur, beta blocker and statin.  OK to discharge home today from cardiac standpoint.  Cardiology will sign off.   ESRD on HD: Nephrology following   Severe PAD s/p bilateral AKA  For questions or updates, please contact [place] Please consult [email or web address] for contact info under     Signed, Lauree Chandler, MD , [facility] 07/27/2022, 8:20 AM

## 2022-07-27 NOTE — Progress Notes (Addendum)
Patient D.C.via stretcher with [place] [facility] alert and orin with no complaints. With belongings.

## 2022-07-27 NOTE — Progress Notes (Signed)
Attempted report with [facility].  Placed on hold for 15 minutes, no one answered.

## 2022-09-04 ENCOUNTER — Ambulatory Visit (INDEPENDENT_AMBULATORY_CARE_PROVIDER_SITE_OTHER): Payer: Medicare Other | Admitting: Physician Assistant

## 2022-09-04 VITALS — BP 186/93 | HR 88 | Temp 98.0°F | Resp 20 | Ht 61.0 in

## 2022-09-04 DIAGNOSIS — I739 Peripheral vascular disease, unspecified: Secondary | ICD-10-CM

## 2022-09-04 NOTE — Progress Notes (Signed)
POST OPERATIVE OFFICE NOTE    CC:  F/u for surgery  HPI:  [name] is a 42 y.o. female who is s/p left above-knee amputation on 07/02/2022 by Dr. Scot Dock.  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 Trula Slade.  Pt returns today for follow up.  She is doing okay, however she was admitted for an NSTEMI in December.  She has been back at her nursing facility for almost a month now.  She is hopeful to increase in her activity level.  She states about a month ago the nursing facility took the staples out of her left above-knee amputation.  She has not had any issues with healing or infection.   Allergies  Allergen Reactions   Morphine Rash and Other (See Comments)   Peanut-Containing Drug Products Anaphylaxis and Hives   Penicillins Rash and Other (See Comments)    Rash in 2008.  Tolerated cefazolin in 2020   Chocolate Hives   Shellfish Allergy Swelling    Current Outpatient Medications  Medication Sig Dispense Refill   Accu-Chek FastClix Lancets MISC Use as instructed to check blood sugar up to TID. E11.22 (Patient taking differently: 1 each by Other route See admin instructions. Use as instructed to check blood sugar up to TID. E11.22) 102 each 11   aspirin EC 81 MG tablet Take 1 tablet (81 mg total) by mouth daily. Swallow whole. 30 tablet 12   atorvastatin (LIPITOR) 40 MG tablet Place 1 tablet (40 mg total) into feeding tube daily.     Blood Glucose Monitoring Suppl (ACCU-CHEK GUIDE ME) w/Device KIT 1 kit by Does not apply route 3 (three) times daily. Use to check BG at home up to 3 times daily. E11.22 1 kit 0   cinacalcet (SENSIPAR) 90 MG tablet Take 90 mg by mouth daily.     clopidogrel (PLAVIX) 75 MG tablet Take 1 tablet (75 mg total) by mouth daily.     Continuous Blood Gluc Transmit (DEXCOM G6 TRANSMITTER) MISC 1 Device by Does not apply route as directed. 1 each 3   docusate (COLACE) 50 MG/5ML liquid Take 100 mg by mouth 2 (two) times daily.      dorzolamide-timolol (COSOPT) 22.3-6.8 MG/ML ophthalmic solution Place 1 drop into the left eye 2 (two) times daily.     fluconazole (DIFLUCAN) 150 MG tablet Take 150 mg by mouth every Monday, Wednesday, and Friday. 1 tablet by mouth every Monday, Wednesday, and Friday for 3 administrations.     Glucagon, rDNA, (GLUCAGON EMERGENCY) 1 MG KIT Inject 1 mg into the muscle once. If BS drops below 60     glucose blood (ACCU-CHEK GUIDE) test strip Use as instructed to check blood sugar up to TID. E11.22 (Patient taking differently: 1 each by Other route See admin instructions. Use as instructed to check blood sugar up to TID. E11.22) 100 each 12   insulin aspart (NOVOLOG) 100 UNIT/ML injection Inject 0-6 Units into the skin every 4 (four) hours. (Patient taking differently: Inject 0-10 Units into the skin 3 (three) times daily with meals. 121-150=1 unit 152-200=2 units 201-250=3 units 251-300=5 units 301-350=7 units 351-400=10 units) 10 mL 11   insulin glargine (SEMGLEE) 100 UNIT/ML injection Inject 30 Units into the skin daily at 6 PM.     Insulin Pen Needle 32G X 4 MM MISC 1 Device by Does not apply route as directed. 150 each 11   isosorbide mononitrate (IMDUR) 30 MG 24 hr tablet Take  1 tablet (30 mg total) by mouth daily.     metoprolol tartrate (LOPRESSOR) 25 MG tablet Take 0.5 tablets (12.5 mg total) by mouth 2 (two) times daily.     midodrine (PROAMATINE) 5 MG tablet Take 1 tablet (5 mg total) by mouth 3 (three) times daily as needed (low blood pressure).     Multiple Vitamin (MULTIVITAMIN WITH MINERALS) TABS tablet Take 1 tablet by mouth daily.     Nutritional Supplements (NUTRITIONAL DRINK) LIQD Take 120 mLs by mouth daily.     nystatin (MYCOSTATIN) 100000 UNIT/ML suspension Take 5 mLs (500,000 Units total) by mouth 4 (four) times daily. 60 mL 0   ondansetron (ZOFRAN) 4 MG tablet Take 4 mg by mouth every 6 (six) hours as needed for nausea/vomiting.     oxyCODONE-acetaminophen (PERCOCET)  5-325 MG tablet Take 1 tablet by mouth every 8 (eight) hours as needed for severe pain. 4 tablet 0   polyethylene glycol (MIRALAX / GLYCOLAX) 17 g packet Take 17 g by mouth daily at 6 PM.     Probiotic, Lactobacillus, CAPS Take 1 capsule by mouth in the morning and at bedtime.     sodium chloride flush (NS) 0.9 % SOLN Inject 10 mLs into the vein at bedtime.     thiamine 100 MG tablet Place 1 tablet (100 mg total) into feeding tube daily.     No current facility-administered medications for this visit.     ROS:  See HPI  Physical Exam:  Incision: No staples present along left AKA incision site.  Left AKA has healed well Extremities: Well-healed bilateral AKA's    Assessment/Plan:  This is a 42 y.o. female who is s/p: Left AKA on 07/02/2022  -The patient has a history of right AKA in March 2023.  She is now well-healed from a left AKA on 07/02/2022 by Dr. Scot Dock. -The patient experienced an NSTEMI and required hospitalization in December.  At that time she likely missed her follow-up appointment with our office.  The patient states sometime last month one of the providers at her nursing facility removed all of the staples from her left AKA.  She denies any issues with infection or drainage from the site -She is hopeful to obtain bilateral prosthetics for her AKA's and be able to walk again.  I will place referral to the [place] clinic. -She can follow-up with our office as needed  The patient has a bilateral Above Knee Amputation. The patient is well motivated to return to their prior functional status by utilizing a prosthesis to perform ADL's and maintain a healthy lifestyle. The patient has the physical and cognitive capacity to function with a prosthesis.   Functional Level: K1 Household Ambulator: Has the ability or potential to use prosthesis for transfers/ambulation on level surfaces at a fixed cadence  Residual Limb History: The skin condition of the residual limb is intact. The  patient will continue to monitor the skin of the residual limb and follow hygiene instructions.  The patient is experiencing phantom limb pain   Prosthetic Prescription Plan: Counseling and education regarding prosthetic management will be provided to the patient via a certified prosthetist. A multi-discipline team, including physical therapy, will manage the prosthetic fabrication, fitting and prosthetic gait training.     Vicente Serene, PA-C [facility] [phone]   Clinic MD:  Carlis Abbott

## 2022-12-20 ENCOUNTER — Inpatient Hospital Stay (HOSPITAL_COMMUNITY)
Admission: EM | Admit: 2022-12-20 | Discharge: 2022-12-28 | DRG: 871 | Disposition: A | Discharge status: Skilled Nursing Facility | Payer: Medicare Other | Source: Skilled Nursing Facility | Attending: Internal Medicine | Admitting: Internal Medicine

## 2022-12-20 ENCOUNTER — Emergency Department (HOSPITAL_COMMUNITY): Payer: Medicare Other

## 2022-12-20 ENCOUNTER — Encounter (HOSPITAL_COMMUNITY): Payer: Self-pay

## 2022-12-20 ENCOUNTER — Other Ambulatory Visit: Payer: Self-pay

## 2022-12-20 DIAGNOSIS — Z794 Long term (current) use of insulin: Secondary | ICD-10-CM

## 2022-12-20 DIAGNOSIS — R7989 Other specified abnormal findings of blood chemistry: Secondary | ICD-10-CM | POA: Diagnosis present

## 2022-12-20 DIAGNOSIS — Z885 Allergy status to narcotic agent status: Secondary | ICD-10-CM

## 2022-12-20 DIAGNOSIS — I120 Hypertensive chronic kidney disease with stage 5 chronic kidney disease or end stage renal disease: Secondary | ICD-10-CM | POA: Diagnosis present

## 2022-12-20 DIAGNOSIS — I953 Hypotension of hemodialysis: Secondary | ICD-10-CM | POA: Diagnosis not present

## 2022-12-20 DIAGNOSIS — R112 Nausea with vomiting, unspecified: Secondary | ICD-10-CM | POA: Diagnosis present

## 2022-12-20 DIAGNOSIS — N2581 Secondary hyperparathyroidism of renal origin: Secondary | ICD-10-CM | POA: Diagnosis present

## 2022-12-20 DIAGNOSIS — K6389 Other specified diseases of intestine: Principal | ICD-10-CM

## 2022-12-20 DIAGNOSIS — D631 Anemia in chronic kidney disease: Secondary | ICD-10-CM | POA: Diagnosis present

## 2022-12-20 DIAGNOSIS — Z89611 Acquired absence of right leg above knee: Secondary | ICD-10-CM

## 2022-12-20 DIAGNOSIS — Z992 Dependence on renal dialysis: Secondary | ICD-10-CM | POA: Diagnosis not present

## 2022-12-20 DIAGNOSIS — Z681 Body mass index (BMI) 19 or less, adult: Secondary | ICD-10-CM | POA: Diagnosis not present

## 2022-12-20 DIAGNOSIS — E1022 Type 1 diabetes mellitus with diabetic chronic kidney disease: Secondary | ICD-10-CM | POA: Diagnosis present

## 2022-12-20 DIAGNOSIS — R6521 Severe sepsis with septic shock: Secondary | ICD-10-CM | POA: Diagnosis present

## 2022-12-20 DIAGNOSIS — E1165 Type 2 diabetes mellitus with hyperglycemia: Secondary | ICD-10-CM

## 2022-12-20 DIAGNOSIS — E440 Moderate protein-calorie malnutrition: Secondary | ICD-10-CM | POA: Diagnosis present

## 2022-12-20 DIAGNOSIS — A419 Sepsis, unspecified organism: Secondary | ICD-10-CM | POA: Diagnosis present

## 2022-12-20 DIAGNOSIS — E785 Hyperlipidemia, unspecified: Secondary | ICD-10-CM | POA: Diagnosis present

## 2022-12-20 DIAGNOSIS — E872 Acidosis, unspecified: Secondary | ICD-10-CM | POA: Diagnosis present

## 2022-12-20 DIAGNOSIS — I774 Celiac artery compression syndrome: Secondary | ICD-10-CM | POA: Diagnosis present

## 2022-12-20 DIAGNOSIS — R5382 Chronic fatigue, unspecified: Secondary | ICD-10-CM | POA: Diagnosis not present

## 2022-12-20 DIAGNOSIS — Z89612 Acquired absence of left leg above knee: Secondary | ICD-10-CM

## 2022-12-20 DIAGNOSIS — Z8673 Personal history of transient ischemic attack (TIA), and cerebral infarction without residual deficits: Secondary | ICD-10-CM

## 2022-12-20 DIAGNOSIS — Z79899 Other long term (current) drug therapy: Secondary | ICD-10-CM

## 2022-12-20 DIAGNOSIS — E46 Unspecified protein-calorie malnutrition: Secondary | ICD-10-CM | POA: Diagnosis present

## 2022-12-20 DIAGNOSIS — Z7902 Long term (current) use of antithrombotics/antiplatelets: Secondary | ICD-10-CM

## 2022-12-20 DIAGNOSIS — R109 Unspecified abdominal pain: Secondary | ICD-10-CM | POA: Diagnosis present

## 2022-12-20 DIAGNOSIS — I252 Old myocardial infarction: Secondary | ICD-10-CM

## 2022-12-20 DIAGNOSIS — E10649 Type 1 diabetes mellitus with hypoglycemia without coma: Secondary | ICD-10-CM | POA: Diagnosis not present

## 2022-12-20 DIAGNOSIS — N186 End stage renal disease: Secondary | ICD-10-CM | POA: Diagnosis present

## 2022-12-20 DIAGNOSIS — Z7189 Other specified counseling: Secondary | ICD-10-CM | POA: Diagnosis not present

## 2022-12-20 DIAGNOSIS — Z515 Encounter for palliative care: Secondary | ICD-10-CM | POA: Diagnosis not present

## 2022-12-20 DIAGNOSIS — Z7982 Long term (current) use of aspirin: Secondary | ICD-10-CM

## 2022-12-20 DIAGNOSIS — E1065 Type 1 diabetes mellitus with hyperglycemia: Secondary | ICD-10-CM | POA: Diagnosis present

## 2022-12-20 DIAGNOSIS — R54 Age-related physical debility: Secondary | ICD-10-CM | POA: Diagnosis present

## 2022-12-20 DIAGNOSIS — I70203 Unspecified atherosclerosis of native arteries of extremities, bilateral legs: Secondary | ICD-10-CM | POA: Diagnosis present

## 2022-12-20 DIAGNOSIS — E875 Hyperkalemia: Secondary | ICD-10-CM | POA: Diagnosis present

## 2022-12-20 DIAGNOSIS — K559 Vascular disorder of intestine, unspecified: Secondary | ICD-10-CM | POA: Diagnosis not present

## 2022-12-20 DIAGNOSIS — K55059 Acute (reversible) ischemia of intestine, part and extent unspecified: Secondary | ICD-10-CM | POA: Diagnosis present

## 2022-12-20 DIAGNOSIS — M898X9 Other specified disorders of bone, unspecified site: Secondary | ICD-10-CM | POA: Diagnosis present

## 2022-12-20 DIAGNOSIS — Z833 Family history of diabetes mellitus: Secondary | ICD-10-CM

## 2022-12-20 DIAGNOSIS — E1051 Type 1 diabetes mellitus with diabetic peripheral angiopathy without gangrene: Secondary | ICD-10-CM | POA: Diagnosis present

## 2022-12-20 DIAGNOSIS — Z880 Allergy status to penicillin: Secondary | ICD-10-CM

## 2022-12-20 LAB — I-STAT VENOUS BLOOD GAS, ED
Acid-Base Excess: 3 mmol/L — ABNORMAL HIGH (ref 0.0–2.0)
Bicarbonate: 30.3 mmol/L — ABNORMAL HIGH (ref 20.0–28.0)
Calcium, Ion: 1.01 mmol/L — ABNORMAL LOW (ref 1.15–1.40)
HCT: 44 % (ref 36.0–46.0)
Hemoglobin: 15 g/dL (ref 12.0–15.0)
O2 Saturation: 79 %
Potassium: 5.6 mmol/L — ABNORMAL HIGH (ref 3.5–5.1)
Sodium: 130 mmol/L — ABNORMAL LOW (ref 135–145)
TCO2: 32 mmol/L (ref 22–32)
pCO2, Ven: 53.7 mmHg (ref 44–60)
pH, Ven: 7.359 (ref 7.25–7.43)
pO2, Ven: 46 mmHg — ABNORMAL HIGH (ref 32–45)

## 2022-12-20 LAB — COMPREHENSIVE METABOLIC PANEL
ALT: 20 U/L (ref 0–44)
AST: 19 U/L (ref 15–41)
Albumin: 3.2 g/dL — ABNORMAL LOW (ref 3.5–5.0)
Alkaline Phosphatase: 138 U/L — ABNORMAL HIGH (ref 38–126)
Anion gap: 17 — ABNORMAL HIGH (ref 5–15)
BUN: 51 mg/dL — ABNORMAL HIGH (ref 6–20)
CO2: 24 mmol/L (ref 22–32)
Calcium: 8.6 mg/dL — ABNORMAL LOW (ref 8.9–10.3)
Chloride: 89 mmol/L — ABNORMAL LOW (ref 98–111)
Creatinine, Ser: 5.91 mg/dL — ABNORMAL HIGH (ref 0.44–1.00)
GFR, Estimated: 9 mL/min — ABNORMAL LOW (ref >60)
Glucose, Bld: 774 mg/dL (ref 70–99)
Potassium: 5.6 mmol/L — ABNORMAL HIGH (ref 3.5–5.1)
Sodium: 130 mmol/L — ABNORMAL LOW (ref 135–145)
Total Bilirubin: 0.5 mg/dL (ref 0.3–1.2)
Total Protein: 8 g/dL (ref 6.5–8.1)

## 2022-12-20 LAB — CBG MONITORING, ED
Glucose-Capillary: >600 mg/dL (ref 70–99)
Glucose-Capillary: >600 mg/dL (ref 70–99)
Glucose-Capillary: 491 mg/dL — ABNORMAL HIGH (ref 70–99)
Glucose-Capillary: 585 mg/dL (ref 70–99)
Glucose-Capillary: 519 mg/dL (ref 70–99)

## 2022-12-20 LAB — LACTIC ACID, PLASMA
Lactic Acid, Venous: 3.1 mmol/L (ref 0.5–1.9)
Lactic Acid, Venous: 3.1 mmol/L (ref 0.5–1.9)

## 2022-12-20 LAB — HEMOGLOBIN A1C
Hgb A1c MFr Bld: 8.2 % — ABNORMAL HIGH (ref 4.8–5.6)
Mean Plasma Glucose: 188.64 mg/dL

## 2022-12-20 LAB — GLUCOSE, CAPILLARY
Glucose-Capillary: 344 mg/dL — ABNORMAL HIGH (ref 70–99)
Glucose-Capillary: 247 mg/dL — ABNORMAL HIGH (ref 70–99)
Glucose-Capillary: 190 mg/dL — ABNORMAL HIGH (ref 70–99)
Glucose-Capillary: 124 mg/dL — ABNORMAL HIGH (ref 70–99)
Glucose-Capillary: 104 mg/dL — ABNORMAL HIGH (ref 70–99)
Glucose-Capillary: 74 mg/dL (ref 70–99)
Glucose-Capillary: 65 mg/dL — ABNORMAL LOW (ref 70–99)
Glucose-Capillary: 93 mg/dL (ref 70–99)
Glucose-Capillary: 94 mg/dL (ref 70–99)

## 2022-12-20 LAB — CBC WITH DIFFERENTIAL/PLATELET
Abs Immature Granulocytes: 0.04 10*3/uL (ref 0.00–0.07)
Basophils Absolute: 0 10*3/uL (ref 0.0–0.1)
Basophils Relative: 0 %
Eosinophils Absolute: 0 10*3/uL (ref 0.0–0.5)
Eosinophils Relative: 0 %
HCT: 41.3 % (ref 36.0–46.0)
Hemoglobin: 12.9 g/dL (ref 12.0–15.0)
Immature Granulocytes: 1 %
Lymphocytes Relative: 7 %
Lymphs Abs: 0.6 10*3/uL — ABNORMAL LOW (ref 0.7–4.0)
MCH: 25.3 pg — ABNORMAL LOW (ref 26.0–34.0)
MCHC: 31.2 g/dL (ref 30.0–36.0)
MCV: 81.1 fL (ref 80.0–100.0)
Monocytes Absolute: 0.3 10*3/uL (ref 0.1–1.0)
Monocytes Relative: 3 %
Neutro Abs: 7.6 10*3/uL (ref 1.7–7.7)
Neutrophils Relative %: 89 %
Platelets: 250 10*3/uL (ref 150–400)
RBC: 5.09 MIL/uL (ref 3.87–5.11)
RDW: 19.3 % — ABNORMAL HIGH (ref 11.5–15.5)
WBC: 8.4 10*3/uL (ref 4.0–10.5)
nRBC: 0 % (ref 0.0–0.2)

## 2022-12-20 LAB — I-STAT BETA HCG BLOOD, ED (MC, WL, AP ONLY): I-stat hCG, quantitative: 17.4 m[IU]/mL — ABNORMAL HIGH (ref <5)

## 2022-12-20 LAB — BASIC METABOLIC PANEL
Anion gap: 19 — ABNORMAL HIGH (ref 5–15)
BUN: 57 mg/dL — ABNORMAL HIGH (ref 6–20)
CO2: 19 mmol/L — ABNORMAL LOW (ref 22–32)
Calcium: 8.5 mg/dL — ABNORMAL LOW (ref 8.9–10.3)
Chloride: 97 mmol/L — ABNORMAL LOW (ref 98–111)
Creatinine, Ser: 5.87 mg/dL — ABNORMAL HIGH (ref 0.44–1.00)
GFR, Estimated: 9 mL/min — ABNORMAL LOW (ref >60)
Glucose, Bld: 247 mg/dL — ABNORMAL HIGH (ref 70–99)
Potassium: 5.6 mmol/L — ABNORMAL HIGH (ref 3.5–5.1)
Sodium: 135 mmol/L (ref 135–145)

## 2022-12-20 LAB — TYPE AND SCREEN
ABO/RH(D): O POS
Antibody Screen: NEGATIVE

## 2022-12-20 LAB — BETA-HYDROXYBUTYRIC ACID: Beta-Hydroxybutyric Acid: 0.08 mmol/L (ref 0.05–0.27)

## 2022-12-20 LAB — POC OCCULT BLOOD, ED: Fecal Occult Bld: POSITIVE — AB

## 2022-12-20 LAB — HIV ANTIBODY (ROUTINE TESTING W REFLEX): HIV Screen 4th Generation wRfx: NONREACTIVE

## 2022-12-20 LAB — LIPASE, BLOOD: Lipase: 40 U/L (ref 11–51)

## 2022-12-20 MED ORDER — PIPERACILLIN-TAZOBACTAM 3.375 G IVPB 30 MIN
3.3750 g | Freq: Once | INTRAVENOUS | Status: AC
  Administered 2022-12-20: 3.375 g via INTRAVENOUS
  Filled 2022-12-20: qty 50

## 2022-12-20 MED ORDER — DORZOLAMIDE HCL-TIMOLOL MAL 2-0.5 % OP SOLN
1.0000 [drp] | Freq: Two times a day (BID) | OPHTHALMIC | Status: DC
  Administered 2022-12-22 – 2022-12-28 (×8): 1 [drp] via OPHTHALMIC
  Filled 2022-12-20: qty 10

## 2022-12-20 MED ORDER — POLYETHYLENE GLYCOL 3350 17 G PO PACK: 17.0000 g | PACK | Freq: Every day | ORAL | Status: DC | PRN

## 2022-12-20 MED ORDER — PIPERACILLIN-TAZOBACTAM IN DEX 2-0.25 GM/50ML IV SOLN
2.2500 g | Freq: Three times a day (TID) | INTRAVENOUS | Status: DC
  Filled 2022-12-20 (×3): qty 50

## 2022-12-20 MED ORDER — CHLORHEXIDINE GLUCONATE CLOTH 2 % EX PADS
6.0000 | MEDICATED_PAD | Freq: Every day | CUTANEOUS | Status: DC
  Administered 2022-12-21 – 2022-12-24 (×4): 6 via TOPICAL

## 2022-12-20 MED ORDER — ONDANSETRON HCL 4 MG/2ML IJ SOLN
4.0000 mg | Freq: Once | INTRAMUSCULAR | Status: AC
  Administered 2022-12-20: 4 mg via INTRAVENOUS
  Filled 2022-12-20: qty 2

## 2022-12-20 MED ORDER — INSULIN REGULAR(HUMAN) IN NACL 100-0.9 UT/100ML-% IV SOLN
INTRAVENOUS | Status: DC
  Administered 2022-12-20: 11 [IU]/h via INTRAVENOUS
  Filled 2022-12-20 (×2): qty 100

## 2022-12-20 MED ORDER — LABETALOL HCL 5 MG/ML IV SOLN: 10.0000 mg | INTRAVENOUS | Status: DC | PRN

## 2022-12-20 MED ORDER — IOHEXOL 350 MG/ML SOLN
75.0000 mL | Freq: Once | INTRAVENOUS | Status: AC | PRN
  Administered 2022-12-20: 75 mL via INTRAVENOUS

## 2022-12-20 MED ORDER — DEXTROSE 50 % IV SOLN
0.0000 mL | INTRAVENOUS | Status: DC | PRN
  Administered 2022-12-21 – 2022-12-22 (×4): 50 mL via INTRAVENOUS
  Administered 2022-12-24: 25 mL via INTRAVENOUS
  Administered 2022-12-24: 50 mL via INTRAVENOUS
  Filled 2022-12-20 (×3): qty 50

## 2022-12-20 MED ORDER — MIDODRINE HCL 5 MG PO TABS
5.0000 mg | ORAL_TABLET | Freq: Three times a day (TID) | ORAL | Status: DC
  Administered 2022-12-20: 5 mg via ORAL
  Filled 2022-12-20: qty 1

## 2022-12-20 MED ORDER — PHENYLEPHRINE HCL-NACL 20-0.9 MG/250ML-% IV SOLN
0.0000 ug/min | INTRAVENOUS | Status: DC
  Administered 2022-12-20: 25 ug/min via INTRAVENOUS
  Administered 2022-12-21 (×2): 95 ug/min via INTRAVENOUS
  Filled 2022-12-20 (×3): qty 250

## 2022-12-20 MED ORDER — SODIUM CHLORIDE 0.9 % IV BOLUS
500.0000 mL | Freq: Once | INTRAVENOUS | Status: AC
  Administered 2022-12-20: 500 mL via INTRAVENOUS

## 2022-12-20 MED ORDER — DOCUSATE SODIUM 100 MG PO CAPS: 100.0000 mg | ORAL_CAPSULE | Freq: Two times a day (BID) | ORAL | Status: DC | PRN

## 2022-12-20 MED ORDER — ACETAMINOPHEN 325 MG PO TABS
650.0000 mg | ORAL_TABLET | ORAL | Status: DC | PRN
  Administered 2022-12-20 – 2022-12-23 (×5): 650 mg via ORAL
  Filled 2022-12-20 (×6): qty 2

## 2022-12-20 MED ORDER — FENTANYL CITRATE PF 50 MCG/ML IJ SOSY
50.0000 ug | PREFILLED_SYRINGE | Freq: Once | INTRAMUSCULAR | Status: AC
  Administered 2022-12-20: 50 ug via INTRAVENOUS
  Filled 2022-12-20: qty 1

## 2022-12-20 MED ORDER — SODIUM CHLORIDE 0.9 % IV SOLN: 250.0000 mL | INTRAVENOUS | Status: DC

## 2022-12-20 MED ORDER — INSULIN DETEMIR 100 UNIT/ML ~~LOC~~ SOLN
5.0000 [IU] | Freq: Two times a day (BID) | SUBCUTANEOUS | Status: DC
  Filled 2022-12-20 (×3): qty 0.05

## 2022-12-20 MED ORDER — INSULIN ASPART 100 UNIT/ML IJ SOLN: 1.0000 [IU] | INTRAMUSCULAR | Status: DC

## 2022-12-20 MED ORDER — SODIUM CHLORIDE 0.9 % IV BOLUS
250.0000 mL | Freq: Once | INTRAVENOUS | Status: AC
  Administered 2022-12-20: 250 mL via INTRAVENOUS

## 2022-12-20 NOTE — Progress Notes (Signed)
eLink Physician-Brief Progress Note Patient Name: [name] DOB: 24-Apr-1981 MRN: [id number]   Date of Service  12/20/2022  HPI/Events of Note  Low blood pressure, patient need Insulin transition orders, patient needs PRN medication order for pain.  eICU Interventions  SQ Insulin transition orders entered, NS 250 ml iv bolus + PRN peripheral Phenylephrine gtt ordered, PRN Tylenol ordered for pain.        Thomasene Lot Yordy Matton 12/20/2022, 8:27 PM

## 2022-12-20 NOTE — Progress Notes (Signed)
eLink Physician-Brief Progress Note Patient Name: [name] DOB: Jan 08, 1981 MRN: [id number]   Date of Service  12/20/2022  HPI/Events of Note  Difficult vascular access.  eICU Interventions  Oral Midodrine ordered to hopefully permit weaning off Phenylephrine gtt.        Thomasene Lot [name] 12/20/2022, 10:56 PM

## 2022-12-20 NOTE — ED Triage Notes (Signed)
BIBA from SNF for c/o blood in stool. Per pt she's had abd pain since yesterday with nausea. Takes plavix, hx of DM, ESRD with HD MWF, right AVF, blind.

## 2022-12-20 NOTE — H&P (Addendum)
NAME:  [name], MRN:  [id number], DOB:  [birth year], LOS: 0 ADMISSION DATE:  12/20/2022, CONSULTATION DATE:  12/20/22 REFERRING MD:  EDP, CHIEF COMPLAINT:  Abd pain   History of Present Illness:  42 year old woman w/ hx of DM, ESRD, bilateral BKA p/w abdominal pain that began yesterday.  No associated bloody bowel movements, N/V.  Her Bps and sugars were high so she came to ER.  Here she has a CT that shows signs of ischemic colon.  Lactate minimally elevated.  Markedly hyperglycemic.  Seen by CCS, expectant management for now given clinical stability, exam and comorbidities.  PCCM asked to admit given clinical complexity of case.  CareEverywhere records: 08/29/21: here after prolonged hospitalization for R foot gangrene s/p TMT amputation. Culture grew MSSA--> on 6 weeks of Ancef.    Apparently her husband left her, her son is possibly being neglected, CPS involved.  Trying to get her a mid-shift appt to help her son (11).  Brought her son today with her to HD.    11/27/21: Seen after another prolonged hospitalization for R BKA gangrene--> R AKA, Miller- Fischer GBS variant, HOH.  Went from [facility] --> [facility] and was just discharged.  She has copious diarrhea now and there is a concern for C diff- will ask SNF to send a stool sample.    5/8: Still looks frail, lowering EDW today. KS  03/19/22 no complaints today. LK  11/12/22: has bedbugs now.   Pertinent  Medical History  DM2 Prior CVA Bilateral BKA ESRD on HD Anemia  Significant Hospital Events: Including procedures, antibiotic start and stop dates in addition to other pertinent events   5/9 admit  Interim History / Subjective:  Admit  Objective   Blood pressure (!) 189/86, pulse 92, temperature 98.3 F (36.8 C), resp. rate 18, SpO2 100 %.        Intake/Output Summary (Last 24 hours) at 12/20/2022 1316 Last data filed at 12/20/2022 1021 Gross per 24 hour  Intake 500 ml  Output --  Net 500 ml   There were no vitals filed for this  visit.  Examination: General: no distress laying in bed HENT: MMM, trachea midline Lungs: clear, no wheezing Cardiovascular: RRR, ext warm, strong upper ext pulses Abdomen: soft, hypoactive BS, mild-moderate ttp in lower quadrants without rebound or guarding Extremities: RUE fistula in place good thrill, bilateral stumps look clean Neuro: moves ext to command, answers questions appropriately Skin: no rashes, she states her sacral pressure injuries have healed  Resolved Hospital Problem list   N/A  Assessment & Plan:  CT imaging findings concerning for ischemic colitis; exam and clinical history less c/w this. Does have a lot of abdominal atherosclerosis so maybe HD-induced relative hypotension leading to abd findings? 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   Multiple pressure injuries POA Uncontrolled HTN  - NPO - Zosyn - Check beta hydroxybutyrate; agree with hyperglycemic crisis insulin protocol for  now - Watch in ICU, trend lactate, abd exam, appreciate CCS assistance - Discussed code status, wants everything done at this time  Best Practice (right click and "Reselect all SmartList Selections" daily)   Diet/type: NPO DVT prophylaxis: not indicated GI prophylaxis: N/A Lines: N/A Foley:  N/A Code Status:  full code Last date of multidisciplinary goals of care discussion [N/A]  Labs   CBC: Recent Labs  Lab 12/20/22 0918 12/20/22 0946  WBC 8.4  --   NEUTROABS 7.6  --   HGB 12.9 15.0  HCT 41.3 44.0  MCV 81.1  --   PLT 250  --     Basic Metabolic Panel: Recent Labs  Lab 12/20/22 0918 12/20/22 0946  NA 130* 130*  K 5.6* 5.6*  CL 89*  --   CO2 24  --   GLUCOSE 774*  --   BUN 51*  --   CREATININE 5.91*  --   CALCIUM 8.6*  --    GFR: CrCl cannot be calculated (Unknown ideal weight.). Recent Labs  Lab  12/20/22 0918 12/20/22 1002  WBC 8.4  --   LATICACIDVEN  --  3.1*    Liver Function Tests: Recent Labs  Lab 12/20/22 0918  AST 19  ALT 20  ALKPHOS 138*  BILITOT 0.5  PROT 8.0  ALBUMIN 3.2*   Recent Labs  Lab 12/20/22 0918  LIPASE 40   No results for input(s): "AMMONIA" in the last 168 hours.  ABG    Component Value Date/Time   PHART 7.440 09/22/2021 1134   PCO2ART 34.8 09/22/2021 1134   PO2ART 63.6 (L) 09/22/2021 1134   HCO3 30.3 (H) 12/20/2022 0946   TCO2 32 12/20/2022 0946   ACIDBASEDEF 0.4 09/22/2021 1134   O2SAT 79 12/20/2022 0946     Coagulation Profile: No results for input(s): "INR", "PROTIME" in the last 168 hours.  Cardiac Enzymes: No results for input(s): "CKTOTAL", "CKMB", "CKMBINDEX", "TROPONINI" in the last 168 hours.  HbA1C: Hgb A1c MFr Bld  Date/Time Value Ref Range Status  06/28/2022 11:06 AM 5.1 4.8 - 5.6 % Final    Comment:    (NOTE) Pre diabetes:          5.7%-6.4%  Diabetes:              >6.4%  Glycemic control for   <7.0% adults with diabetes   04/03/2022 06:14 AM 6.9 (H) 4.8 - 5.6 % Final    Comment:    (NOTE) Pre diabetes:          5.7%-6.4%  Diabetes:              >6.4%  Glycemic control for   <7.0% adults with diabetes     CBG: Recent Labs  Lab 12/20/22 0914 12/20/22 1154 12/20/22 1238 12/20/22 1313  GLUCAP >600* >600* 585* 491*    Review of Systems:    Positive Symptoms in bold:  Constitutional fevers, chills, weight loss, fatigue, anorexia, malaise  Eyes decreased vision, double vision, eye irritation  Ears, Nose, Mouth, Throat sore throat, trouble swallowing, sinus congestion  Cardiovascular chest pain, paroxysmal nocturnal dyspnea, lower ext edema, palpitations   Respiratory SOB, cough, DOE, hemoptysis, wheezing  Gastrointestinal nausea, vomiting, diarrhea  Genitourinary burning with urination, trouble urinating  Musculoskeletal joint aches, joint swelling, back pain  Integumentary  rashes, skin  lesions  Neurological focal weakness, focal numbness, trouble speaking, headaches  Psychiatric depression, anxiety, confusion  Endocrine polyuria, polydipsia, cold intolerance, heat intolerance  Hematologic abnormal bruising, abnormal bleeding, unexplained nose bleeds  Allergic/Immunologic recurrent infections, hives, swollen lymph nodes     Past Medical History:  She,  has a past medical history of Anemia, Anxiety, Blind right eye (2008), Diabetes mellitus without complication (HCC), Embolic stroke (HCC), ESRD (end stage renal disease) (HCC), ESRD on hemodialysis (HCC), and GBS (Guillain Barre syndrome) (HCC).   Surgical History:   Past Surgical History:  Procedure Laterality Date  AMPUTATION Right 08/11/2021   Procedure: TRANSMETATARSAL AMPUTATION OF RIGHT FOOT;  Surgeon: Nada Libman, MD;  Location: [facility] OR;  Service: Vascular;  Laterality: Right;   AMPUTATION Right 09/17/2021   Procedure: AMPUTATION RIGHT BELOW KNEE;  Surgeon: Nada Libman, MD;  Location: [facility] OR;  Service: Vascular;  Laterality: Right;   AMPUTATION Right 10/11/2021   Procedure: RIGHT ABOVE KNEE AMPUTATION;  Surgeon: Nada Libman, MD;  Location: [facility] OR;  Service: Vascular;  Laterality: Right;   AMPUTATION Left 07/02/2022   Procedure: LEFT ABOVE KNEE AMPUTATION;  Surgeon: Chuck Hint, MD;  Location: [facility] OR;  Service: Vascular;  Laterality: Left;   AMPUTATION TOE Left    APPLICATION OF WOUND VAC  08/16/2021   Procedure: APPLICATION OF WOUND VAC;  Surgeon: Nada Libman, MD;  Location: [facility] OR;  Service: Vascular;;   AV FISTULA PLACEMENT     BUBBLE STUDY  10/02/2021   Procedure: BUBBLE STUDY;  Surgeon: Pricilla Riffle, MD;  Location: [facility] ENDOSCOPY;  Service: Cardiovascular;;   CESAREAN SECTION  2011   FISTULA SUPERFICIALIZATION Right 07/27/2019   Procedure: 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;  Surgeon: Chuck Hint, MD;  Location: [facility] OR;  Service: Vascular;  Laterality: Right;    INSERTION OF DIALYSIS CATHETER Left 07/27/2019   Procedure: INSERTION OF TUNNELED  DIALYSIS CATHETER;  Surgeon: Chuck Hint, MD;  Location: [facility] OR;  Service: Vascular;  Laterality: Left;   INSERTION OF DIALYSIS CATHETER Left 10/23/2021   Procedure: INSERTION OF TUNNELED PALINDROME PRECISION DIALYSIS CATHETER (23cm);  Surgeon: Cephus Shelling, MD;  Location: [facility] OR;  Service: Vascular;  Laterality: Left;   IR FLUORO GUIDE CV LINE LEFT  10/16/2021   IR FLUORO GUIDE CV LINE RIGHT  10/11/2021   IR GASTROSTOMY TUBE MOD SED  10/30/2021   IR GASTROSTOMY TUBE REMOVAL  05/08/2022   IR US GUIDE VASC ACCESS LEFT  10/16/2021   IR US GUIDE VASC ACCESS RIGHT  10/11/2021   LEFT HEART CATH AND CORONARY ANGIOGRAPHY N/A 07/26/2022   Procedure: LEFT HEART CATH AND CORONARY ANGIOGRAPHY;  Surgeon: Swaziland, Peter M, MD;  Location: [facility] INVASIVE CV LAB;  Service: Cardiovascular;  Laterality: N/A;   LOWER EXTREMITY ANGIOGRAPHY N/A 08/11/2021   Procedure: LOWER EXTREMITY ANGIOGRAPHY;  Surgeon: Nada Libman, MD;  Location: [facility] INVASIVE CV LAB;  Service: Cardiovascular;  Laterality: N/A;   REVISON OF ARTERIOVENOUS FISTULA Right 10/23/2021   Procedure: REVISON OF ARTERIOVENOUS FISTULA  ARM AND PLICATION;  Surgeon: Cephus Shelling, MD;  Location: [facility] OR;  Service: Vascular;  Laterality: Right;   TEE WITHOUT CARDIOVERSION N/A 10/02/2021   Procedure: TRANSESOPHAGEAL ECHOCARDIOGRAM (TEE);  Surgeon: Pricilla Riffle, MD;  Location[facility] ENDOSCOPY;  Service: Cardiovascular;  Laterality: N/A;   TRANSMETATARSAL AMPUTATION Right 08/16/2021   Procedure: CLOSURE OF RIGHT TRANSMETATARSAL AMPUTATION;  Surgeon: Nada Libman, MD;  Location: [facility] OR;  Service: Vascular;  Laterality: Right;     Social History:   reports that she has never smoked. She has never used smokeless tobacco. She reports that she does not drink alcohol and does not use drugs.   Family History:  Her family history includes Diabetes in her father and mother.    Allergies Allergies  Allergen Reactions   Morphine Rash and Other (See Comments)   Peanut-Containing Drug Products Anaphylaxis and Hives   Penicillins Rash and Other (See Comments)    Rash in 2008.  Tolerated cefazolin in 2020   Chocolate Hives  Shellfish Allergy Swelling     Home Medications  Prior to Admission medications   Medication Sig Start Date End Date Taking? Authorizing Provider  acetaminophen (TYLENOL) 325 MG tablet Take 650 mg by mouth every 6 (six) hours as needed for fever.   Yes [provider]  amLODipine (NORVASC) 2.5 MG tablet Take 2.5 mg by mouth daily. 11/26/22  Yes [provider]  aspirin EC 81 MG tablet Take 1 tablet (81 mg total) by mouth daily. Swallow whole. 07/27/22  Yes Vann, Jessica U, DO  atorvastatin (LIPITOR) 40 MG tablet Place 1 tablet (40 mg total) into feeding tube daily. 10/26/21  Yes Burnadette Pop, MD  azelastine (OPTIVAR) 0.05 % ophthalmic solution Place 1 drop into both eyes 2 (two) times daily as needed (allergies). 11/13/22  Yes [provider]  cetirizine (ZYRTEC) 10 MG tablet Take 10 mg by mouth daily.   Yes [provider]  cinacalcet (SENSIPAR) 90 MG tablet Take 90 mg by mouth daily. 09/22/19  Yes [provider]  docusate sodium (COLACE) 100 MG capsule Take 100 mg by mouth at bedtime.   Yes [provider]  dorzolamide-timolol (COSOPT) 22.3-6.8 MG/ML ophthalmic solution Place 1 drop into the left eye 2 (two) times daily. 03/30/21  Yes [provider]  Glucagon, rDNA, (GLUCAGON EMERGENCY) 1 MG KIT Inject 1 mg into the muscle once. If BS drops below 60 06/10/22  Yes [provider]  insulin aspart (NOVOLOG) 100 UNIT/ML injection Inject 0-6 Units into the skin every 4 (four) hours. Patient taking differently: Inject 0-10 Units into the skin 3 (three) times daily with meals. 121-150=1 unit 152-200=2 units 201-250=3 units 251-300=5 units 301-350=7 units 351-400=10 units  10/25/21  Yes Adhikari, Amrit, MD  insulin glargine (SEMGLEE) 100 UNIT/ML injection Inject 30 Units into the skin daily at 6 PM.   Yes [provider]  isosorbide mononitrate (IMDUR) 30 MG 24 hr tablet Take 1 tablet (30 mg total) by mouth daily. 07/27/22  Yes Vann, Jessica U, DO  melatonin 3 MG TABS tablet Take 3 mg by mouth at bedtime.   Yes [provider]  metoprolol tartrate (LOPRESSOR) 25 MG tablet Take 0.5 tablets (12.5 mg total) by mouth 2 (two) times daily. Patient taking differently: Take 25 mg by mouth 2 (two) times daily. Hold for SBP less than 110 or heart rate less than 60 07/27/22  Yes Vann, Jessica U, DO  midodrine (PROAMATINE) 5 MG tablet Take 1 tablet (5 mg total) by mouth 3 (three) times daily as needed (low blood pressure). 07/27/22  Yes Joseph Art, DO  Multiple Vitamin (MULTIVITAMIN WITH MINERALS) TABS tablet Take 1 tablet by mouth daily.   Yes [provider]  ondansetron (ZOFRAN) 4 MG tablet Take 4 mg by mouth every 6 (six) hours as needed for nausea/vomiting. 03/02/22  Yes [provider]  oxyCODONE-acetaminophen (PERCOCET) 5-325 MG tablet Take 1 tablet by mouth every 8 (eight) hours as needed for severe pain. 07/27/22  Yes Vann, Jessica U, DO  polyethylene glycol (MIRALAX / GLYCOLAX) 17 g packet Take 17 g by mouth daily at 6 PM.   Yes [provider]  Accu-Chek FastClix Lancets MISC Use as instructed to check blood sugar up to TID. E11.22 Patient taking differently: 1 each by Other route See admin instructions. Use as instructed to check blood sugar up to TID. E11.22 04/15/19   Hoy Register, MD  Blood Glucose Monitoring Suppl (ACCU-CHEK GUIDE ME) w/Device KIT 1 kit by Does not apply  route 3 (three) times daily. Use to check BG at home up to 3 times daily. E11.22 04/15/19   Hoy Register, MD  clopidogrel (PLAVIX) 75 MG tablet Take 1 tablet (75 mg total) by mouth daily. Patient not taking: Reported on 12/20/2022 07/27/22   Joseph Art, DO  Continuous Blood Gluc Transmit (DEXCOM G6 TRANSMITTER) MISC 1 Device by Does not apply route as directed. 09/12/20   Shamleffer, Konrad Dolores, MD  glucose blood (ACCU-CHEK GUIDE) test strip Use as instructed to check blood sugar up to TID. E11.22 Patient taking differently: 1 each by Other route See admin instructions. Use as instructed to check blood sugar up to TID. E11.22 07/17/19   Fulp, Cammie, MD  Insulin Pen Needle 32G X 4 MM MISC 1 Device by Does not apply route as directed. 05/13/20   Shamleffer, Konrad Dolores, MD  Nutritional Supplements (NUTRITIONAL DRINK) LIQD Take 120 mLs by mouth daily.    [provider]  nystatin (MYCOSTATIN) 100000 UNIT/ML suspension Take 5 mLs (500,000 Units total) by mouth 4 (four) times daily. Patient not taking: Reported on 12/20/2022 10/25/21   Burnadette Pop, MD  sodium chloride flush (NS) 0.9 % SOLN Inject 10 mLs into the vein at bedtime.    [provider]  thiamine 100 MG tablet Place 1 tablet (100 mg total) into feeding tube daily. Patient not taking: Reported on 12/20/2022 10/26/21   Burnadette Pop, MD     Critical care time: 31 mins independent of procedures

## 2022-12-20 NOTE — Progress Notes (Signed)
Consult received. 2nd nurse to assess for PIV placement for vasopressor. No suitable vein found at this time. Veins small or non-compressible. Notified nurse. VU. Tomasita Morrow, RN VAST

## 2022-12-20 NOTE — Progress Notes (Signed)
Dear Doctor: This patient has been identified as a candidate for CVC for the following reason (s): drug pH or osmolality (causing phlebitis, infiltration in 24 hours), drug extravasation potential with tissue necrosis (KCL, Dilantin, Dopamine, CaCl, MgSO4, chemo vesicant), and poor veins/poor circulatory system (CHF, COPD, emphysema, diabetes, steroid use, IV drug abuse, etc.) If you agree, please write an order for the indicated device.   Thank you for supporting the early vascular access assessment program.

## 2022-12-20 NOTE — ED Provider Notes (Signed)
[facility] AT [facility] Provider Note   CSN: [id number] Arrival date & time: 12/20/22  0901     History  Chief Complaint  Patient presents with   Abdominal Pain    [name] is a 42 y.o. female.  [name] is a 42 y.o. 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  Patient reports abdominal pain started yesterday after completing dialysis, she had full treatment without issue.  She reports pain is primarily in the center of her abdomen and is severe.  She reports with that she has had multiple episodes of vomiting.  She is unsure if she has had blood in her vomit but facility staff stated that they noted blood in her brief today.  She denies any pain elsewhere, no chest pain or shortness of breath.  Reports that she did not take her medications this morning due to vomiting.  No known fevers.  No other aggravating or alleviating factors.  Patient takes Plavix daily but is not on any other anticoagulation.  The history is provided by the patient and the EMS personnel.  Abdominal Pain Associated symptoms: nausea and vomiting   Associated symptoms: no chest pain, no chills, no cough, no fever and no shortness of breath        Home Medications Prior to Admission medications   Medication Sig Start Date End Date Taking? Authorizing Provider  Accu-Chek FastClix Lancets MISC Use as instructed to check blood sugar up to TID. E11.22 Patient taking differently: 1 each by Other route See admin instructions. Use as instructed to check blood sugar up to TID. E11.22 04/15/19   Hoy Register, MD  aspirin EC 81 MG tablet Take 1 tablet (81 mg total) by mouth daily. Swallow whole. 07/27/22   Joseph Art, DO  atorvastatin (LIPITOR) 40 MG tablet Place 1 tablet (40 mg total) into feeding tube daily. 10/26/21   Burnadette Pop, MD  Blood Glucose Monitoring Suppl  (ACCU-CHEK GUIDE ME) w/Device KIT 1 kit by Does not apply route 3 (three) times daily. Use to check BG at home up to 3 times daily. E11.22 04/15/19   Hoy Register, MD  cinacalcet (SENSIPAR) 90 MG tablet Take 90 mg by mouth daily. 09/22/19   [provider]  clopidogrel (PLAVIX) 75 MG tablet Take 1 tablet (75 mg total) by mouth daily. 07/27/22   Joseph Art, DO  Continuous Blood Gluc Transmit (DEXCOM G6 TRANSMITTER) MISC 1 Device by Does not apply route as directed. 09/12/20   Shamleffer, Konrad Dolores, MD  docusate (COLACE) 50 MG/5ML liquid Take 100 mg by mouth 2 (two) times daily.    [provider]  dorzolamide-timolol (COSOPT) 22.3-6.8 MG/ML ophthalmic solution Place 1 drop into the left eye 2 (two) times daily. 03/30/21   [provider]  fluconazole (DIFLUCAN) 150 MG tablet Take 150 mg by mouth every Monday, Wednesday, and Friday. 1 tablet by mouth every Monday, Wednesday, and Friday for 3 administrations.    [provider]  Glucagon, rDNA, (GLUCAGON EMERGENCY) 1 MG KIT Inject 1 mg into the muscle once. If BS drops below 60 06/10/22   [provider]  glucose blood (ACCU-CHEK GUIDE) test strip Use as instructed to check blood sugar up to TID. E11.22 Patient taking differently: 1 each by Other route See admin instructions. Use as instructed to check blood sugar up to TID. E11.22 07/17/19  Fulp, Cammie, MD  insulin aspart (NOVOLOG) 100 UNIT/ML injection Inject 0-6 Units into the skin every 4 (four) hours. Patient taking differently: Inject 0-10 Units into the skin 3 (three) times daily with meals. 121-150=1 unit 152-200=2 units 201-250=3 units 251-300=5 units 301-350=7 units 351-400=10 units 10/25/21   Burnadette Pop, MD  insulin glargine (SEMGLEE) 100 UNIT/ML injection Inject 30 Units into the skin daily at 6 PM.    [provider]  Insulin Pen Needle 32G X 4 MM MISC 1 Device by Does not apply route as directed. 05/13/20   Shamleffer,  Konrad Dolores, MD  isosorbide mononitrate (IMDUR) 30 MG 24 hr tablet Take 1 tablet (30 mg total) by mouth daily. 07/27/22   Joseph Art, DO  metoprolol tartrate (LOPRESSOR) 25 MG tablet Take 0.5 tablets (12.5 mg total) by mouth 2 (two) times daily. 07/27/22   Joseph Art, DO  midodrine (PROAMATINE) 5 MG tablet Take 1 tablet (5 mg total) by mouth 3 (three) times daily as needed (low blood pressure). 07/27/22   Joseph Art, DO  Multiple Vitamin (MULTIVITAMIN WITH MINERALS) TABS tablet Take 1 tablet by mouth daily.    [provider]  Nutritional Supplements (NUTRITIONAL DRINK) LIQD Take 120 mLs by mouth daily.    [provider]  nystatin (MYCOSTATIN) 100000 UNIT/ML suspension Take 5 mLs (500,000 Units total) by mouth 4 (four) times daily. 10/25/21   Burnadette Pop, MD  ondansetron (ZOFRAN) 4 MG tablet Take 4 mg by mouth every 6 (six) hours as needed for nausea/vomiting. 03/02/22   [provider]  oxyCODONE-acetaminophen (PERCOCET) 5-325 MG tablet Take 1 tablet by mouth every 8 (eight) hours as needed for severe pain. 07/27/22   Joseph Art, DO  polyethylene glycol (MIRALAX / GLYCOLAX) 17 g packet Take 17 g by mouth daily at 6 PM.    [provider]  Probiotic, Lactobacillus, CAPS Take 1 capsule by mouth in the morning and at bedtime.    [provider]  sodium chloride flush (NS) 0.9 % SOLN Inject 10 mLs into the vein at bedtime.    [provider]  thiamine 100 MG tablet Place 1 tablet (100 mg total) into feeding tube daily. 10/26/21   Burnadette Pop, MD      Allergies    Morphine, Peanut-containing drug products, Penicillins, Chocolate, and Shellfish allergy    Review of Systems   Review of Systems  Constitutional:  Negative for chills and fever.  HENT: Negative.    Respiratory:  Negative for cough and shortness of breath.   Cardiovascular:  Negative for chest pain.  Gastrointestinal:  Positive for abdominal pain, nausea  and vomiting.    Physical Exam Updated Vital Signs BP (!) 215/88   Pulse 86   Temp 98.3 F (36.8 C)   Resp 18   SpO2 100%  Physical Exam Vitals and nursing note reviewed.  Constitutional:      General: She is not in acute distress.    Appearance: Normal appearance. She is well-developed. She is ill-appearing. She is not diaphoretic.     Comments: Alert, chronically ill-appearing, appears uncomfortable  HENT:     Head: Normocephalic and atraumatic.     Mouth/Throat:     Pharynx: Oropharynx is clear.  Eyes:     General:        Right eye: No discharge.        Left eye: No discharge.     Pupils: Pupils are equal, round, and reactive to light.  Cardiovascular:     Rate and Rhythm: Normal rate and regular rhythm.     Pulses: Normal pulses.     Heart sounds: Normal heart sounds.  Pulmonary:     Effort: Pulmonary effort is normal. No respiratory distress.     Breath sounds: Normal breath sounds. No wheezing or rales.     Comments: Respirations equal and unlabored, patient able to speak in full sentences, lungs clear to auscultation bilaterally  Abdominal:     General: Bowel sounds are normal. There is no distension.     Palpations: Abdomen is soft. There is no mass.     Tenderness: There is abdominal tenderness. There is no guarding.     Comments: Abdomen soft, nondistended, bowel sounds hypoactive, generalized abdominal tenderness, most notable across upper abdomen and in periumbilical region, no guarding or rebound tenderness  Genitourinary:    Comments: Chaperone present. Dark red stool present on rectal exam Musculoskeletal:        General: No deformity.     Cervical back: Neck supple.     Comments: Bilateral AKA  Skin:    General: Skin is warm and dry.     Capillary Refill: Capillary refill takes less than 2 seconds.  Neurological:     Mental Status: She is alert and oriented to person, place, and time.     Coordination: Coordination normal.     Comments: Speech is  clear, able to follow commands Moves extremities without ataxia, coordination intact  Psychiatric:        Mood and Affect: Mood normal.        Behavior: Behavior normal.     ED Results / Procedures / Treatments   Labs (all labs ordered are listed, but only abnormal results are displayed) Labs Reviewed  COMPREHENSIVE METABOLIC PANEL - Abnormal; Notable for the following components:      Result Value   Sodium 130 (*)    Potassium 5.6 (*)    Chloride 89 (*)    Glucose, Bld 774 (*)    BUN 51 (*)    Creatinine, Ser 5.91 (*)    Calcium 8.6 (*)    Albumin 3.2 (*)    Alkaline Phosphatase 138 (*)    GFR, Estimated 9 (*)    Anion gap 17 (*)    All other components within normal limits  LACTIC ACID, PLASMA - Abnormal; Notable for the following components:   Lactic Acid, Venous 3.1 (*)    All other components within normal limits  CBC - Abnormal; Notable for the following components:   RBC 2.94 (*)    Hemoglobin 7.5 (*)    HCT 24.0 (*)    MCH 25.5 (*)    RDW 21.0 (*)    All other components within normal limits  GLUCOSE, CAPILLARY - Abnormal; Notable for the following components:   Glucose-Capillary 117 (*)    All other components within normal limits  GLUCOSE, CAPILLARY - Abnormal; Notable for the following components:   Glucose-Capillary 210 (*)    All other components within normal limits  CBG MONITORING, ED - Abnormal; Notable for the following components:   Glucose-Capillary >600 (*)    All other components within normal limits  POC OCCULT BLOOD, ED - Abnormal; Notable for the following components:   Fecal Occult Bld POSITIVE (*)    All other components within normal limits  I-STAT VENOUS BLOOD GAS, ED - Abnormal; Notable for the following components:   pO2, Ven 46 (*)    Bicarbonate 30.3 (*)  Acid-Base Excess 3.0 (*)    Sodium 130 (*)    Potassium 5.6 (*)    Calcium, Ion 1.01 (*)    All other components within normal limits  I-STAT BETA HCG BLOOD, ED ([facility], [facility], [facility]  ONLY) - Abnormal; Notable for the following components:   I-stat hCG, quantitative 17.4 (*)    All other components within normal limits  CBG MONITORING, ED - Abnormal; Notable for the following components:   Glucose-Capillary >600 (*)    All other components within normal limits  CBG MONITORING, ED - Abnormal; Notable for the following components:   Glucose-Capillary 585 (*)    All other components within normal limits  CBG MONITORING, ED - Abnormal; Notable for the following components:   Glucose-Capillary 491 (*)    All other components within normal limits  CBG MONITORING, ED - Abnormal; Notable for the following components:   Glucose-Capillary 519 (*)    All other components within normal limits  CULTURE, BLOOD (ROUTINE X 2)  CULTURE, BLOOD (ROUTINE X 2)  LIPASE, BLOOD    EKG EKG Interpretation  Date/Time:  Thursday Dec 20 2022 09:09:01 EDT Ventricular Rate:  85 PR Interval:  123 QRS Duration: 86 QT Interval:  396 QTC Calculation: 471 R Axis:   44 Text Interpretation: Sinus rhythm Confirmed by Alvino Blood ([id number]) on 12/20/2022 12:13:35 PM  Radiology CT ABDOMEN PELVIS W CONTRAST  Result Date: 12/20/2022 CLINICAL DATA:  Abdominal pain EXAM: CT ABDOMEN AND PELVIS WITH CONTRAST TECHNIQUE: Multidetector CT imaging of the abdomen and pelvis was performed using the standard protocol following bolus administration of intravenous contrast. RADIATION DOSE REDUCTION: This exam was performed according to the departmental dose-optimization program which includes automated exposure control, adjustment of the mA and/or kV according to patient size and/or use of iterative reconstruction technique. CONTRAST:  75mL OMNIPAQUE IOHEXOL 350 MG/ML SOLN COMPARISON:  CT abdomen and pelvis dated February 09, 2022 FINDINGS: Lower chest: Cardiomegaly. Linear opacity of the right lung base at site of previously seen consolidation, consistent with scarring. Hepatobiliary: Portal venous gas. Biliary ductal  dilation, unchanged when compared with the prior exam. Gallstones with no evidence of gallbladder wall thickening. Pancreas: Atrophic. No pancreatic ductal dilatation or surrounding inflammatory changes. Spleen: Normal in size without focal abnormality. Adrenals/Urinary Tract: Atrophic bilateral kidneys. No hydronephrosis. Bladder wall thickening. Stomach/Bowel: Wall thickening and pneumatosis of the transverse and proximal descending colon. Gas seen in the mesenteric veins adjacent to the transverse colon. No evidence of obstruction. Normal appendix. Vascular/Lymphatic: Severe aortic atherosclerosis, including severe atherosclerotic disease of the distal branch vessels of the SMA and celiac arteries. No enlarged abdominal or pelvic lymph nodes. Reproductive: Uterus and bilateral adnexa are unremarkable. Other: No abdominal wall hernia or abnormality. No abdominopelvic ascites. Musculoskeletal: No acute or significant osseous findings. IMPRESSION: 1. Wall thickening and pneumatosis of the transverse and proximal descending colon with associated mesenteric venous and portal venous gas, findings are highly concerning for bowel ischemia. 2. Severe aortic atherosclerosis (ICD10-I70.0), including severe atherosclerotic disease of the distal branch vessels of the SMA and celiac arteries. 3. Bladder wall thickening, findings can be seen in the setting of cystitis. Correlate with urinalysis. Critical Value/emergent results were called by telephone at the time of interpretation on 12/20/2022 at 11:34 am to [facility] , who verbally acknowledged these results. Electronically Signed   By: Allegra Lai M.D.   On: 12/20/2022 11:38   DG Chest Port 1 View  Result Date: 12/20/2022 CLINICAL DATA:  Abdominal pain EXAM: PORTABLE  CHEST 1 VIEW COMPARISON:  CXR 07/23/22 FINDINGS: No pleural effusion. No pneumothorax. No focal airspace opacity. No radiographically apparent displaced rib fractures. Visualized upper abdomen is  unremarkable. Normal cardiac and mediastinal contours. IMPRESSION: No focal airspace opacity Electronically Signed   By: Lorenza Cambridge M.D.   On: 12/20/2022 10:15     Procedures .Critical Care  Performed by: Dartha Lodge, PA-C Authorized by: Dartha Lodge, PA-C   Critical care provider statement:    Critical care time (minutes):  45   Critical care time was exclusive of:  Separately billable procedures and treating other patients   Critical care was necessary to treat or prevent imminent or life-threatening deterioration of the following conditions: pneumatosis concerning for bowel ischemia.   Critical care was time spent personally by me on the following activities:  Development of treatment plan with patient or surrogate, discussions with consultants, evaluation of patient's response to treatment, examination of patient, obtaining history from patient or surrogate, ordering and performing treatments and interventions, ordering and review of laboratory studies, ordering and review of radiographic studies, pulse oximetry, re-evaluation of patient's condition and review of old charts   Care discussed with: admitting provider       Medications Ordered in ED Medications  fentaNYL (SUBLIMAZE) injection 50 mcg (50 mcg Intravenous Given 12/20/22 0947)  ondansetron (ZOFRAN) injection 4 mg (4 mg Intravenous Given 12/20/22 0947)  sodium chloride 0.9 % bolus 500 mL (0 mLs Intravenous Stopped 12/20/22 1021)  iohexol (OMNIPAQUE) 350 MG/ML injection 75 mL (75 mLs Intravenous Contrast Given 12/20/22 1057)    ED Course/ Medical Decision Making/ A&P                             Medical Decision Making Amount and/or Complexity of Data Reviewed Labs: ordered. Radiology: ordered.  Risk Prescription drug management. Decision regarding hospitalization.   42 y.o. female presents to the ED with complaints of abdominal pain and vomiting, this involves an extensive number of treatment options, and is a  complaint that carries with it a high risk of complications and morbidity.  The differential diagnosis includes bowel obstruction, perforation, colitis, ischemia, gastroenteritis, peptic ulcer disease, GI bleeding, diverticulitis, appendicitis, cholecystitis  On arrival pt is nontoxic, vitals significant for hypertension with blood pressure of 215/88 on arrival, patient unable to take blood pressure medications this morning due to vomiting.   Additional history obtained from EMS personnel. Previous records obtained and reviewed   I ordered medication IV nausea and pain medication   Lab Tests:  I Ordered, reviewed, and interpreted labs, which included: Hemoccult positive, CBG > 600, but fortunately VBG shows normal pH and bicarb as well as CO2, does not suggest DKA, mild anion gap but I suspect this is more so in the setting of lactic acidosis, lactic elevated at 3.1, no leukocytosis, aside from hyperglycemia with glucose of 774 on CMP, potassium of 5.6 and sodium of 130 no other significant electrolyte derangements, creatinine BUN elevated as to be expected in ESRD.  Normal lipase.  Imaging Studies ordered:  I ordered imaging studies which included CT abdomen pelvis and chest x-ray, I independently visualized and interpreted imaging which showed chest x-ray without evidence of pneumonia or other acute findings, received call from radiologist regarding CT abdomen pelvis wall thickening and pneumatosis of the transverse and proximal descending colon associated with mesenteric and venous portal gas, findings concerning for ischemic bowel.   Despite very concerning findings on CT  patient remains hemodynamically stable.  ED Course:   Significantly elevated glucose, given patient's dialysis status she cannot tolerate large volumes of fluid, fortunately no evidence of DKA or HHS at this time but will start on glucose stabilizer protocol with insulin drip.  Emergent consult placed to general surgery given  findings consistent for ischemic bowel.  Case discussed with PA Carl Best with general surgery who will be down to see patient shortly, request critical care consult for admission.  Case discussed with Dr. Levon Hedger with critical care who will see and admit the patient.    I discussed concerning results with patient at bedside, attempted to notify family members, but phone numbers for her sister and husband in the chart or not active and patient unable to recall their phone numbers.     Portions of this note were generated with Scientist, clinical (histocompatibility and immunogenetics). Dictation errors may occur despite best attempts at proofreading.         Final Clinical Impression(s) / ED Diagnoses Final diagnoses:  Pneumatosis of intestines    Rx / DC Orders ED Discharge Orders     None         Legrand Rams 01/01/23 1509    Lonell Grandchild, MD 01/02/23 [phone]

## 2022-12-20 NOTE — Progress Notes (Addendum)
1917: Attending, Dr. Katrinka Blazing notified of dayshift nurse stopping insulin gtt at 1840. Needing further clarification of plan from here.   1949: Called Elink about getting in contact with Attending and about next steps for blood sugar management. Notified of patients low blood pressure.   2020: Dr. Warrick Parisian addressed low blood pressure with 250cc NS bolus and Phenylephrine gtt started. Tylenol given for generalized pain. Patient offered something to eat but refused. She would only drink juice.    IV team attempted x2 for peripheral IV and was unsuccessful. IV team stated IV watch will not work with current IV because it is too deep.   2155: IV team identified patient as a candidate for CVC.  2200: Elink nurse notified of patient needing better access for Phenylephrine, antibiotics, and further medications.   2256: Midodrine ordered to hopefully permit weaning off Phenylephrine gtt  0030: Clarification for BP goal. Goal is MAP >65  Patient has refused labs multiple times throughout the night.

## 2022-12-20 NOTE — ED Notes (Signed)
ED TO INPATIENT HANDOFF REPORT  ED Nurse Name and Phone #: [phone]  S Name/Age/Gender [name] 42 y.o. female Room/Bed: 002C/002C  Code Status   Code Status: Full Code  Home/SNF/Other Home Patient oriented to: self, place, time, and situation Is this baseline? 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 Per pt she's had abd pain since yesterday with nausea. Takes plavix, hx of DM, ESRD with HD MWF, right AVF, blind.    Allergies Allergies  Allergen Reactions   Morphine Rash and Other (See Comments)   Peanut-Containing Drug Products Anaphylaxis and Hives   Penicillins Rash and Other (See Comments)    Rash in 2008.  Tolerated cefazolin in 2020   Chocolate Hives   Shellfish Allergy Swelling    Level of Care/Admitting Diagnosis ED Disposition     ED Disposition  Admit   Condition  --   Comment  Hospital Area: [facility] [[id number]]  Level of Care: ICU [6]  May admit patient to [facility] or [facility] if equivalent level of care is available:: No  Covid Evaluation: Asymptomatic - no recent exposure (last 10 days) testing not required  Diagnosis: Ischemic bowel disease [facility]) [[id number]]  Admitting Physician: Lorin Glass [[id number]]  Attending Physician: Lorin Glass [[id number]]  Certification:: I certify this patient will need inpatient services for at least 2 midnights  Estimated Length of Stay: 5          B Medical/Surgery History Past Medical History:  Diagnosis Date   Anemia    Anxiety    Blind right eye 2008   Diabetes mellitus without complication (HCC)    Embolic stroke (HCC)    ESRD (end stage renal disease) (HCC)    HD on M/W/F   ESRD on hemodialysis (HCC)    GBS (Guillain Barre syndrome) (HCC)    Past Surgical History:  Procedure Laterality Date   AMPUTATION Right 08/11/2021   Procedure: TRANSMETATARSAL AMPUTATION OF  RIGHT FOOT;  Surgeon: Nada Libman, MD;  Location: [facility] OR;  Service: Vascular;  Laterality: Right;   AMPUTATION Right 09/17/2021   Procedure: AMPUTATION RIGHT BELOW KNEE;  Surgeon: Nada Libman, MD;  Location: [facility] OR;  Service: Vascular;  Laterality: Right;   AMPUTATION Right 10/11/2021   Procedure: RIGHT ABOVE KNEE AMPUTATION;  Surgeon: Nada Libman, MD;  Location[facility] OR;  Service: Vascular;  Laterality: Right;   AMPUTATION Left 07/02/2022   Procedure: LEFT ABOVE KNEE AMPUTATION;  Surgeon: Chuck Hint, MD;  Location: [facility] OR;  Service: Vascular;  Laterality: Left;   AMPUTATION TOE Left    APPLICATION OF WOUND VAC  08/16/2021   Procedure: APPLICATION OF WOUND VAC;  Surgeon: Nada Libman, MD;  Location: [facility] OR;  Service: Vascular;;   AV FISTULA PLACEMENT     BUBBLE STUDY  10/02/2021   Procedure: BUBBLE STUDY;  Surgeon: Pricilla Riffle, MD;  Location: [facility] ENDOSCOPY;  Service: Cardiovascular;;   CESAREAN SECTION  2011   FISTULA SUPERFICIALIZATION Right 07/27/2019   Procedure: 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;  Surgeon: Chuck Hint, MD;  Location[facility] OR;  Service: Vascular;  Laterality: Right;   INSERTION OF DIALYSIS CATHETER Left 07/27/2019   Procedure: INSERTION OF TUNNELED  DIALYSIS CATHETER;  Surgeon: Chuck Hint, MD;  Location: [facility] OR;  Service: Vascular;  Laterality: Left;   INSERTION OF DIALYSIS CATHETER  Left 10/23/2021   Procedure: INSERTION OF TUNNELED PALINDROME PRECISION DIALYSIS CATHETER (23cm);  Surgeon: Cephus Shelling, MD;  Location: [facility] OR;  Service: Vascular;  Laterality: Left;   IR FLUORO GUIDE CV LINE LEFT  10/16/2021   IR FLUORO GUIDE CV LINE RIGHT  10/11/2021   IR GASTROSTOMY TUBE MOD SED  10/30/2021   IR GASTROSTOMY TUBE REMOVAL  05/08/2022   IR US GUIDE VASC ACCESS LEFT  10/16/2021   IR US GUIDE VASC ACCESS RIGHT  10/11/2021   LEFT HEART CATH AND CORONARY ANGIOGRAPHY N/A 07/26/2022   Procedure: LEFT HEART CATH AND CORONARY  ANGIOGRAPHY;  Surgeon: Swaziland, Peter M, MD;  Location: [facility] INVASIVE CV LAB;  Service: Cardiovascular;  Laterality: N/A;   LOWER EXTREMITY ANGIOGRAPHY N/A 08/11/2021   Procedure: LOWER EXTREMITY ANGIOGRAPHY;  Surgeon: Nada Libman, MD;  Location: [facility] INVASIVE CV LAB;  Service: Cardiovascular;  Laterality: N/A;   REVISON OF ARTERIOVENOUS FISTULA Right 10/23/2021   Procedure: REVISON OF ARTERIOVENOUS FISTULA  ARM AND PLICATION;  Surgeon: Cephus Shelling, MD;  Location: [facility] OR;  Service: Vascular;  Laterality: Right;   TEE WITHOUT CARDIOVERSION N/A 10/02/2021   Procedure: TRANSESOPHAGEAL ECHOCARDIOGRAM (TEE);  Surgeon: Pricilla Riffle, MD;  Location: [facility] ENDOSCOPY;  Service: Cardiovascular;  Laterality: N/A;   TRANSMETATARSAL AMPUTATION Right 08/16/2021   Procedure: CLOSURE OF RIGHT TRANSMETATARSAL AMPUTATION;  Surgeon: Nada Libman, MD;  Location: [facility] OR;  Service: Vascular;  Laterality: Right;     A IV Location/Drains/Wounds Patient Lines/Drains/Airways Status     Active Line/Drains/Airways     Name Placement date Placement time Site Days   Peripheral IV 12/20/22 20 G 2.5" Left Antecubital 12/20/22  0941  Antecubital  less than 1   Fistula / Graft Right Upper arm Arteriovenous fistula 02/09/22  1200  Upper arm  314   Pressure Injury 02/10/22 Sacrum Left Stage 2 -  Partial thickness loss of dermis presenting as a shallow open injury with a red, pink wound bed without slough. 02/10/22  0500  -- 313   Wound / Incision (Open or Dehisced) 02/10/22 Other (Comment) Buttocks Left PU stage 2. 4 cm 02/10/22  0500  Buttocks  313            Intake/Output Last 24 hours  Intake/Output Summary (Last 24 hours) at 12/20/2022 1323 Last data filed at 12/20/2022 1021 Gross per 24 hour  Intake 500 ml  Output --  Net 500 ml    Labs/Imaging Results for orders placed or performed during the hospital encounter of 12/20/22 (from the past 48 hour(s))  CBG monitoring, ED     Status: Abnormal   Collection  Time: 12/20/22  9:14 AM  Result Value Ref Range   Glucose-Capillary >600 (HH) 70 - 99 mg/dL    Comment: Glucose reference range applies only to samples taken after fasting for at least 8 hours.  POC occult blood, ED Provider will collect     Status: Abnormal   Collection Time: 12/20/22  9:17 AM  Result Value Ref Range   Fecal Occult Bld POSITIVE (A) NEGATIVE  Comprehensive metabolic panel     Status: Abnormal   Collection Time: 12/20/22  9:18 AM  Result Value Ref Range   Sodium 130 (L) 135 - 145 mmol/L   Potassium 5.6 (H) 3.5 - 5.1 mmol/L   Chloride 89 (L) 98 - 111 mmol/L   CO2 24 22 - 32 mmol/L   Glucose, Bld 774 (HH) 70 - 99 mg/dL    Comment: CRITICAL  RESULT CALLED TO, READ BACK BY AND VERIFIED WITH Veleta Miners, RN [phone] 12/20/22 L. KLAR Glucose reference range applies only to samples taken after fasting for at least 8 hours.    BUN 51 (H) 6 - 20 mg/dL   Creatinine, Ser 9.60 (H) 0.44 - 1.00 mg/dL   Calcium 8.6 (L) 8.9 - 10.3 mg/dL   Total Protein 8.0 6.5 - 8.1 g/dL   Albumin 3.2 (L) 3.5 - 5.0 g/dL   AST 19 15 - 41 U/L   ALT 20 0 - 44 U/L   Alkaline Phosphatase 138 (H) 38 - 126 U/L   Total Bilirubin 0.5 0.3 - 1.2 mg/dL   GFR, Estimated 9 (L) >60 mL/min    Comment: (NOTE) Calculated using the CKD-EPI Creatinine Equation (2021)    Anion gap 17 (H) 5 - 15    Comment: Performed at [facility] Lab, [address], [place], [place] [address]  Lipase, blood     Status: None   Collection Time: 12/20/22  9:18 AM  Result Value Ref Range   Lipase 40 11 - 51 U/L    Comment: Performed at [facility], [address], [place], [place] [address]  CBC with Differential     Status: Abnormal   Collection Time: 12/20/22  9:18 AM  Result Value Ref Range   WBC 8.4 4.0 - 10.5 K/uL   RBC 5.09 3.87 - 5.11 MIL/uL   Hemoglobin 12.9 12.0 - 15.0 g/dL   HCT 47.8 29.5 - 62.1 %   MCV 81.1 80.0 - 100.0 fL   MCH 25.3 (L) 26.0 - 34.0 pg   MCHC 31.2 30.0 - 36.0 g/dL   RDW 30.8 (H) 65.7 - 84.6 %    Platelets 250 150 - 400 K/uL   nRBC 0.0 0.0 - 0.2 %   Neutrophils Relative % 89 %   Neutro Abs 7.6 1.7 - 7.7 K/uL   Lymphocytes Relative 7 %   Lymphs Abs 0.6 (L) 0.7 - 4.0 K/uL   Monocytes Relative 3 %   Monocytes Absolute 0.3 0.1 - 1.0 K/uL   Eosinophils Relative 0 %   Eosinophils Absolute 0.0 0.0 - 0.5 K/uL   Basophils Relative 0 %   Basophils Absolute 0.0 0.0 - 0.1 K/uL   Immature Granulocytes 1 %   Abs Immature Granulocytes 0.04 0.00 - 0.07 K/uL    Comment: Performed at [facility], [address] [place], [place] [address]  Type and screen [facility]     Status: None   Collection Time: 12/20/22  9:36 AM  Result Value Ref Range   ABO/RH(D) O POS    Antibody Screen NEG    Sample Expiration      12/23/2022,2359 Performed at [facility], [address] [place], [place] [address]   I-Stat venous blood gas, ED     Status: Abnormal   Collection Time: 12/20/22  9:46 AM  Result Value Ref Range   pH, Ven 7.359 7.25 - 7.43   pCO2, Ven 53.7 44 - 60 mmHg   pO2, Ven 46 (H) 32 - 45 mmHg   Bicarbonate 30.3 (H) 20.0 - 28.0 mmol/L   TCO2 32 22 - 32 mmol/L   O2 Saturation 79 %   Acid-Base Excess 3.0 (H) 0.0 - 2.0 mmol/L   Sodium 130 (L) 135 - 145 mmol/L   Potassium 5.6 (H) 3.5 - 5.1 mmol/L   Calcium, Ion 1.01 (L) 1.15 - 1.40 mmol/L  HCT 44.0 36.0 - 46.0 %   Hemoglobin 15.0 12.0 - 15.0 g/dL   Sample type VENOUS   Lactic acid, plasma     Status: Abnormal   Collection Time: 12/20/22 10:02 AM  Result Value Ref Range   Lactic Acid, Venous 3.1 (HH) 0.5 - 1.9 mmol/L    Comment: CRITICAL RESULT CALLED TO, READ BACK BY AND VERIFIED WITH Veleta Miners, RN [phone] 12/20/22 L. KLAR Performed at [facility], [address], [place], [place] [address]   CBG monitoring, ED     Status: Abnormal   Collection Time: 12/20/22 11:54 AM  Result Value Ref Range   Glucose-Capillary >600 (HH) 70 - 99 mg/dL    Comment: Glucose reference range applies only to samples  taken after fasting for at least 8 hours.   Comment 1 Notify RN   CBG monitoring, ED     Status: Abnormal   Collection Time: 12/20/22 12:38 PM  Result Value Ref Range   Glucose-Capillary 585 (HH) 70 - 99 mg/dL    Comment: Glucose reference range applies only to samples taken after fasting for at least 8 hours.  CBG monitoring, ED     Status: Abnormal   Collection Time: 12/20/22  1:13 PM  Result Value Ref Range   Glucose-Capillary 491 (H) 70 - 99 mg/dL    Comment: Glucose reference range applies only to samples taken after fasting for at least 8 hours.   CT ABDOMEN PELVIS W CONTRAST  Result Date: 12/20/2022 CLINICAL DATA:  Abdominal pain EXAM: CT ABDOMEN AND PELVIS WITH CONTRAST TECHNIQUE: Multidetector CT imaging of the abdomen and pelvis was performed using the standard protocol following bolus administration of intravenous contrast. RADIATION DOSE REDUCTION: This exam was performed according to the departmental dose-optimization program which includes automated exposure control, adjustment of the mA and/or kV according to patient size and/or use of iterative reconstruction technique. CONTRAST:  75mL OMNIPAQUE IOHEXOL 350 MG/ML SOLN COMPARISON:  CT abdomen and pelvis dated February 09, 2022 FINDINGS: Lower chest: Cardiomegaly. Linear opacity of the right lung base at site of previously seen consolidation, consistent with scarring. Hepatobiliary: Portal venous gas. Biliary ductal dilation, unchanged when compared with the prior exam. Gallstones with no evidence of gallbladder wall thickening. Pancreas: Atrophic. No pancreatic ductal dilatation or surrounding inflammatory changes. Spleen: Normal in size without focal abnormality. Adrenals/Urinary Tract: Atrophic bilateral kidneys. No hydronephrosis. Bladder wall thickening. Stomach/Bowel: Wall thickening and pneumatosis of the transverse and proximal descending colon. Gas seen in the mesenteric veins adjacent to the transverse colon. No evidence of  obstruction. Normal appendix. Vascular/Lymphatic: Severe aortic atherosclerosis, including severe atherosclerotic disease of the distal branch vessels of the SMA and celiac arteries. No enlarged abdominal or pelvic lymph nodes. Reproductive: Uterus and bilateral adnexa are unremarkable. Other: No abdominal wall hernia or abnormality. No abdominopelvic ascites. Musculoskeletal: No acute or significant osseous findings. IMPRESSION: 1. Wall thickening and pneumatosis of the transverse and proximal descending colon with associated mesenteric venous and portal venous gas, findings are highly concerning for bowel ischemia. 2. Severe aortic atherosclerosis (ICD10-I70.0), including severe atherosclerotic disease of the distal branch vessels of the SMA and celiac arteries. 3. Bladder wall thickening, findings can be seen in the setting of cystitis. Correlate with urinalysis. Critical Value/emergent results were called by telephone at the time of interpretation on 12/20/2022 at 11:34 am to [facility] , who verbally acknowledged these results. Electronically Signed   By: Allegra Lai M.D.   On: 12/20/2022 11:38   DG  Chest Port 1 View  Result Date: 12/20/2022 CLINICAL DATA:  Abdominal pain EXAM: PORTABLE CHEST 1 VIEW COMPARISON:  CXR 07/23/22 FINDINGS: No pleural effusion. No pneumothorax. No focal airspace opacity. No radiographically apparent displaced rib fractures. Visualized upper abdomen is unremarkable. Normal cardiac and mediastinal contours. IMPRESSION: No focal airspace opacity Electronically Signed   By: Lorenza Cambridge M.D.   On: 12/20/2022 10:15    Pending Labs Unresulted Labs (From admission, onward)     Start     Ordered   12/21/22 0500  CBC  Daily,   R      12/20/22 1308   12/20/22 1308  Lactic acid, plasma  STAT Now then every 3 hours,   R      12/20/22 1308   12/20/22 1304  HIV Antibody (routine testing w rflx)  (HIV Antibody (Routine testing w reflex) panel)  Once,   R        12/20/22 1306             Vitals/Pain Today's Vitals   12/20/22 1123 12/20/22 1145 12/20/22 1230 12/20/22 1317  BP:  (!) 198/91 (!) 189/86   Pulse:  94 92   Resp:   18   Temp:    98.1 F (36.7 C)  TempSrc:    Oral  SpO2:  100% 100%   PainSc: Asleep       Isolation Precautions No active isolations  Medications Medications  insulin regular, human (MYXREDLIN) 100 units/ 100 mL infusion (11 Units/hr Intravenous Rate/Dose Change 12/20/22 1239)  dextrose 50 % solution 0-50 mL (has no administration in time range)  docusate sodium (COLACE) capsule 100 mg (has no administration in time range)  polyethylene glycol (MIRALAX / GLYCOLAX) packet 17 g (has no administration in time range)  labetalol (NORMODYNE) injection 10 mg (has no administration in time range)  fentaNYL (SUBLIMAZE) injection 50 mcg (50 mcg Intravenous Given 12/20/22 0947)  ondansetron (ZOFRAN) injection 4 mg (4 mg Intravenous Given 12/20/22 0947)  sodium chloride 0.9 % bolus 500 mL (0 mLs Intravenous Stopped 12/20/22 1021)  iohexol (OMNIPAQUE) 350 MG/ML injection 75 mL (75 mLs Intravenous Contrast Given 12/20/22 1057)    Mobility non-ambulatory     Focused Assessments Renal Assessment Handoff:    R Recommendations: See Admitting Provider Note  Report given to:   Additional Notes:  Amputated

## 2022-12-20 NOTE — Progress Notes (Signed)
Beta hydroxybutyrate neg, okay for insulin drip alone.  Will keep eye on BMP.  Myrla Halsted MD PCCM

## 2022-12-20 NOTE — Progress Notes (Signed)
Pharmacy Antibiotic Note  [name] is a 42 y.o. female admitted on 12/20/2022 with  intra-abdominal infection .  Pharmacy has been consulted for Zosyn dosing.  Patient presented from SNF with blood in stool, abdominal pain, and nausea. Is ESRD with HD MWF. WBC 8.4, lactate 3.1, and afebrile.  Plan: Start Zosyn 2.25g every 8 hours F/u symptoms, cultures and clinical progress to determine length of therapy   Temp (24hrs), Avg:98.3 F (36.8 C), Min:98.3 F (36.8 C), Max:98.3 F (36.8 C)  Recent Labs  Lab 12/20/22 0918 12/20/22 1002  WBC 8.4  --   CREATININE 5.91*  --   LATICACIDVEN  --  3.1*    CrCl cannot be calculated (Unknown ideal weight.).    Allergies  Allergen Reactions   Morphine Rash and Other (See Comments)   Peanut-Containing Drug Products Anaphylaxis and Hives   Penicillins Rash and Other (See Comments)    Rash in 2008.  Tolerated cefazolin in 2020   Chocolate Hives   Shellfish Allergy Swelling    Antimicrobials this admission: Zosyn 5/9 >>    Microbiology results: none  Thank you for allowing pharmacy to be a part of this patient's care.  Marygrace Drought 12/20/2022 1:16 PM

## 2022-12-20 NOTE — Consult Note (Signed)
Consult Note  [name] 02-09-1981  [id number].    Requesting MD: Jodi Geralds, PA-C Chief Complaint/Reason for Consult: mesenteric ischemia   HPI:  Patient is a 42 year old female with PMH significant for ESRD on HD, STEMI, bilateral BKA, hx of osteomyelitis, T2DM, HTN, hx of CVA who presented to the ED from SNF with abdominal pain. Patient had HD yesterday and pain started after this in central abdomen. SNF reportedly noted some blood in brief this AM. She has had associated nausea and vomiting - last episode yesterday. Last PO intake yesterday. Plavix and ASA are on her medication list but she tells me sh does not take these medications. She has history of gastrostomy tube now removed but she cannot remember why or when it was placed. Prior abdominal surgery otherwise includes cesarean section in 2011.   Currently abdominal pain is mild to moderate and not worsening. Denies nausea.   ROS: Negative other than HPI  Family History  Problem Relation Age of Onset   Diabetes Mother    Diabetes Father     Past Medical History:  Diagnosis Date   Anemia    Anxiety    Blind right eye 2008   Diabetes mellitus without complication (HCC)    Embolic stroke (HCC)    ESRD (end stage renal disease) (HCC)    HD on M/W/F   ESRD on hemodialysis (HCC)    GBS (Guillain Barre syndrome) (HCC)     Past Surgical History:  Procedure Laterality Date   AMPUTATION Right 08/11/2021   Procedure: TRANSMETATARSAL AMPUTATION OF RIGHT FOOT;  Surgeon: Nada Libman, MD;  Location: [facility] OR;  Service: Vascular;  Laterality: Right;   AMPUTATION Right 09/17/2021   Procedure: AMPUTATION RIGHT BELOW KNEE;  Surgeon: Nada Libman, MD;  Location: [facility] OR;  Service: Vascular;  Laterality: Right;   AMPUTATION Right 10/11/2021   Procedure: RIGHT ABOVE KNEE AMPUTATION;  Surgeon: Nada Libman, MD;  Location: [facility] OR;  Service: Vascular;  Laterality: Right;   AMPUTATION Left 07/02/2022   Procedure: LEFT  ABOVE KNEE AMPUTATION;  Surgeon: Chuck Hint, MD;  Location: [facility] OR;  Service: Vascular;  Laterality: Left;   AMPUTATION TOE Left    APPLICATION OF WOUND VAC  08/16/2021   Procedure: APPLICATION OF WOUND VAC;  Surgeon: Nada Libman, MD;  Location: [facility] OR;  Service: Vascular;;   AV FISTULA PLACEMENT     BUBBLE STUDY  10/02/2021   Procedure: BUBBLE STUDY;  Surgeon: Pricilla Riffle, MD;  Location: [facility] ENDOSCOPY;  Service: Cardiovascular;;   CESAREAN SECTION  2011   FISTULA SUPERFICIALIZATION Right 07/27/2019   Procedure: 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;  Surgeon: Chuck Hint, MD;  Location: [facility] OR;  Service: Vascular;  Laterality: Right;   INSERTION OF DIALYSIS CATHETER Left 07/27/2019   Procedure: INSERTION OF TUNNELED  DIALYSIS CATHETER;  Surgeon: Chuck Hint, MD;  Location: [facility] OR;  Service: Vascular;  Laterality: Left;   INSERTION OF DIALYSIS CATHETER Left 10/23/2021   Procedure: INSERTION OF TUNNELED PALINDROME PRECISION DIALYSIS CATHETER (23cm);  Surgeon: Cephus Shelling, MD;  Location: [facility] OR;  Service: Vascular;  Laterality: Left;   IR FLUORO GUIDE CV LINE LEFT  10/16/2021   IR FLUORO GUIDE CV LINE RIGHT  10/11/2021   IR GASTROSTOMY TUBE MOD SED  10/30/2021   IR GASTROSTOMY TUBE REMOVAL  05/08/2022   IR US GUIDE VASC ACCESS LEFT  10/16/2021   IR US GUIDE  VASC ACCESS RIGHT  10/11/2021   LEFT HEART CATH AND CORONARY ANGIOGRAPHY N/A 07/26/2022   Procedure: LEFT HEART CATH AND CORONARY ANGIOGRAPHY;  Surgeon: Swaziland, Peter M, MD;  Location: [facility] INVASIVE CV LAB;  Service: Cardiovascular;  Laterality: N/A;   LOWER EXTREMITY ANGIOGRAPHY N/A 08/11/2021   Procedure: LOWER EXTREMITY ANGIOGRAPHY;  Surgeon: Nada Libman, MD;  Location: [facility] INVASIVE CV LAB;  Service: Cardiovascular;  Laterality: N/A;   REVISON OF ARTERIOVENOUS FISTULA Right 10/23/2021   Procedure: REVISON OF ARTERIOVENOUS FISTULA  ARM AND PLICATION;  Surgeon: Cephus Shelling, MD;   Location: [facility] OR;  Service: Vascular;  Laterality: Right;   TEE WITHOUT CARDIOVERSION N/A 10/02/2021   Procedure: TRANSESOPHAGEAL ECHOCARDIOGRAM (TEE);  Surgeon: Pricilla Riffle, MD;  Location: [facility] ENDOSCOPY;  Service: Cardiovascular;  Laterality: N/A;   TRANSMETATARSAL AMPUTATION Right 08/16/2021   Procedure: CLOSURE OF RIGHT TRANSMETATARSAL AMPUTATION;  Surgeon: Nada Libman, MD;  Location: [facility] OR;  Service: Vascular;  Laterality: Right;    Social History:  reports that she has never smoked. She has never used smokeless tobacco. She reports that she does not drink alcohol and does not use drugs.  Allergies:  Allergies  Allergen Reactions   Morphine Rash and Other (See Comments)   Peanut-Containing Drug Products Anaphylaxis and Hives   Penicillins Rash and Other (See Comments)    Rash in 2008.  Tolerated cefazolin in 2020   Chocolate Hives   Shellfish Allergy Swelling    (Not in a hospital admission)   Blood pressure (!) 198/91, pulse 94, temperature 98.3 F (36.8 C), resp. rate 18, SpO2 100 %. Physical Exam:  General: pleasant, WD, female who is laying in bed in NAD HEENT: head is normocephalic, atraumatic.  Ears and nose without any masses or lesions.  Mouth is pink and moist Heart: tachycardic - regular rhythm.  Normal s1,s2. No obvious murmurs, gallops, or rubs noted.  Palpable radial pulse.  Lungs: CTAB, no wheezes, rhonchi, or rales noted.  Respiratory effort nonlabored Abd: soft, ND, +BS, no masses, hernias, or organomegaly. Well healed abdominal scar from prior gastrostomy. Moderate localized TTP left mid abdomen and left upper quadrant MS: BL AKA Skin: warm and dry with no masses, lesions, or rashes Neuro: Cranial nerves 2-12 grossly intact, sensation is normal throughout Psych: A&Ox3 with an appropriate affect.   Results for orders placed or performed during the hospital encounter of 12/20/22 (from the past 48 hour(s))  CBG monitoring, ED     Status: Abnormal    Collection Time: 12/20/22  9:14 AM  Result Value Ref Range   Glucose-Capillary >600 (HH) 70 - 99 mg/dL    Comment: Glucose reference range applies only to samples taken after fasting for at least 8 hours.  POC occult blood, ED Provider will collect     Status: Abnormal   Collection Time: 12/20/22  9:17 AM  Result Value Ref Range   Fecal Occult Bld POSITIVE (A) NEGATIVE  Comprehensive metabolic panel     Status: Abnormal   Collection Time: 12/20/22  9:18 AM  Result Value Ref Range   Sodium 130 (L) 135 - 145 mmol/L   Potassium 5.6 (H) 3.5 - 5.1 mmol/L   Chloride 89 (L) 98 - 111 mmol/L   CO2 24 22 - 32 mmol/L   Glucose, Bld 774 (HH) 70 - 99 mg/dL    Comment: CRITICAL RESULT CALLED TO, READ BACK BY AND VERIFIED WITH Veleta Miners, RN 1047 12/20/22 L. KLAR Glucose reference range applies only to samples  taken after fasting for at least 8 hours.    BUN 51 (H) 6 - 20 mg/dL   Creatinine, Ser 1.47 (H) 0.44 - 1.00 mg/dL   Calcium 8.6 (L) 8.9 - 10.3 mg/dL   Total Protein 8.0 6.5 - 8.1 g/dL   Albumin 3.2 (L) 3.5 - 5.0 g/dL   AST 19 15 - 41 U/L   ALT 20 0 - 44 U/L   Alkaline Phosphatase 138 (H) 38 - 126 U/L   Total Bilirubin 0.5 0.3 - 1.2 mg/dL   GFR, Estimated 9 (L) >60 mL/min    Comment: (NOTE) Calculated using the CKD-EPI Creatinine Equation (2021)    Anion gap 17 (H) 5 - 15    Comment: Performed at [facility] Lab, [address] [place], [place] [address]  Lipase, blood     Status: None   Collection Time: 12/20/22  9:18 AM  Result Value Ref Range   Lipase 40 11 - 51 U/L    Comment: Performed at [facility] Lab, [address] [place], [place] [address]  CBC with Differential     Status: Abnormal   Collection Time: 12/20/22  9:18 AM  Result Value Ref Range   WBC 8.4 4.0 - 10.5 K/uL   RBC 5.09 3.87 - 5.11 MIL/uL   Hemoglobin 12.9 12.0 - 15.0 g/dL   HCT 65.7 84.6 - 96.2 %   MCV 81.1 80.0 - 100.0 fL   MCH 25.3 (L) 26.0 - 34.0 pg   MCHC 31.2 30.0 - 36.0 g/dL   RDW 95.2 (H)  84.1 - 15.5 %   Platelets 250 150 - 400 K/uL   nRBC 0.0 0.0 - 0.2 %   Neutrophils Relative % 89 %   Neutro Abs 7.6 1.7 - 7.7 K/uL   Lymphocytes Relative 7 %   Lymphs Abs 0.6 (L) 0.7 - 4.0 K/uL   Monocytes Relative 3 %   Monocytes Absolute 0.3 0.1 - 1.0 K/uL   Eosinophils Relative 0 %   Eosinophils Absolute 0.0 0.0 - 0.5 K/uL   Basophils Relative 0 %   Basophils Absolute 0.0 0.0 - 0.1 K/uL   Immature Granulocytes 1 %   Abs Immature Granulocytes 0.04 0.00 - 0.07 K/uL    Comment: Performed at [facility], [address], [place], [place] [address]  Type and screen [facility]     Status: None   Collection Time: 12/20/22  9:36 AM  Result Value Ref Range   ABO/RH(D) O POS    Antibody Screen NEG    Sample Expiration      12/23/2022,2359 Performed at [facility], [address], [place], [place] [address]   I-Stat venous blood gas, ED     Status: Abnormal   Collection Time: 12/20/22  9:46 AM  Result Value Ref Range   pH, Ven 7.359 7.25 - 7.43   pCO2, Ven 53.7 44 - 60 mmHg   pO2, Ven 46 (H) 32 - 45 mmHg   Bicarbonate 30.3 (H) 20.0 - 28.0 mmol/L   TCO2 32 22 - 32 mmol/L   O2 Saturation 79 %   Acid-Base Excess 3.0 (H) 0.0 - 2.0 mmol/L   Sodium 130 (L) 135 - 145 mmol/L   Potassium 5.6 (H) 3.5 - 5.1 mmol/L   Calcium, Ion 1.01 (L) 1.15 - 1.40 mmol/L   HCT 44.0 36.0 - 46.0 %   Hemoglobin 15.0 12.0 - 15.0 g/dL   Sample type VENOUS   Lactic  acid, plasma     Status: Abnormal   Collection Time: 12/20/22 10:02 AM  Result Value Ref Range   Lactic Acid, Venous 3.1 (HH) 0.5 - 1.9 mmol/L    Comment: CRITICAL RESULT CALLED TO, READ BACK BY AND VERIFIED WITH Veleta Miners, RN 1047 12/20/22 L. KLAR Performed at [facility], [address] [place], [place] [address]    CT ABDOMEN PELVIS W CONTRAST  Result Date: 12/20/2022 CLINICAL DATA:  Abdominal pain EXAM: CT ABDOMEN AND PELVIS WITH CONTRAST TECHNIQUE: Multidetector CT imaging of the abdomen and pelvis was  performed using the standard protocol following bolus administration of intravenous contrast. RADIATION DOSE REDUCTION: This exam was performed according to the departmental dose-optimization program which includes automated exposure control, adjustment of the mA and/or kV according to patient size and/or use of iterative reconstruction technique. CONTRAST:  75mL OMNIPAQUE IOHEXOL 350 MG/ML SOLN COMPARISON:  CT abdomen and pelvis dated February 09, 2022 FINDINGS: Lower chest: Cardiomegaly. Linear opacity of the right lung base at site of previously seen consolidation, consistent with scarring. Hepatobiliary: Portal venous gas. Biliary ductal dilation, unchanged when compared with the prior exam. Gallstones with no evidence of gallbladder wall thickening. Pancreas: Atrophic. No pancreatic ductal dilatation or surrounding inflammatory changes. Spleen: Normal in size without focal abnormality. Adrenals/Urinary Tract: Atrophic bilateral kidneys. No hydronephrosis. Bladder wall thickening. Stomach/Bowel: Wall thickening and pneumatosis of the transverse and proximal descending colon. Gas seen in the mesenteric veins adjacent to the transverse colon. No evidence of obstruction. Normal appendix. Vascular/Lymphatic: Severe aortic atherosclerosis, including severe atherosclerotic disease of the distal branch vessels of the SMA and celiac arteries. No enlarged abdominal or pelvic lymph nodes. Reproductive: Uterus and bilateral adnexa are unremarkable. Other: No abdominal wall hernia or abnormality. No abdominopelvic ascites. Musculoskeletal: No acute or significant osseous findings. IMPRESSION: 1. Wall thickening and pneumatosis of the transverse and proximal descending colon with associated mesenteric venous and portal venous gas, findings are highly concerning for bowel ischemia. 2. Severe aortic atherosclerosis (ICD10-I70.0), including severe atherosclerotic disease of the distal branch vessels of the SMA and celiac arteries.  3. Bladder wall thickening, findings can be seen in the setting of cystitis. Correlate with urinalysis. Critical Value/emergent results were called by telephone at the time of interpretation on 12/20/2022 at 11:34 am to [facility] , who verbally acknowledged these results. Electronically Signed   By: Allegra Lai M.D.   On: 12/20/2022 11:38   DG Chest Port 1 View  Result Date: 12/20/2022 CLINICAL DATA:  Abdominal pain EXAM: PORTABLE CHEST 1 VIEW COMPARISON:  CXR 07/23/22 FINDINGS: No pleural effusion. No pneumothorax. No focal airspace opacity. No radiographically apparent displaced rib fractures. Visualized upper abdomen is unremarkable. Normal cardiac and mediastinal contours. IMPRESSION: No focal airspace opacity Electronically Signed   By: Lorenza Cambridge M.D.   On: 12/20/2022 10:15      Assessment/Plan Possible mesenteric ischemia  - CT with pneumatosis of transverse and proximal descending colon with associated mesenteric and portal venous gas; severe atherosclerosis including distal SMA and celiac arteries - patient is s/p dialysis yesterday  - lactic 3.1, no leukocytosis  - abdominal exam overall reassuring and do not recommend emergent operative intervention. Hopefully this will improve with resuscitation. We will follow closely. Should she worsen she would need exploratory surgery with bowel resection and ultimately ostomy creation which was discussed with her at length and she would want such intervention should it become warranted. Continue antibiotics and remain NPO  FEN: NPO, IVF ID: [email or web address]  VTE: okay for chemical prophylaxis  - below per TRH/CCM -  T2DM with hyperglycemia - blood glucose >700, starting insulin gtt ESRD on HD Hx of STEMI BL AKA HTN Hx of CVA on asa and plavix per chart review  I reviewed ED provider notes, last 24 h vitals and pain scores, last 48 h intake and output, last 24 h labs and trends, and last 24 h imaging results.  This care required high   level of medical decision making.   Carl Best, [facility] 12/20/2022, 11:54 AM Please see Amion for pager number during day hours 7:00am-4:30pm

## 2022-12-21 DIAGNOSIS — K559 Vascular disorder of intestine, unspecified: Secondary | ICD-10-CM | POA: Diagnosis not present

## 2022-12-21 DIAGNOSIS — E46 Unspecified protein-calorie malnutrition: Secondary | ICD-10-CM | POA: Diagnosis present

## 2022-12-21 LAB — GLUCOSE, CAPILLARY
Glucose-Capillary: 99 mg/dL (ref 70–99)
Glucose-Capillary: 92 mg/dL (ref 70–99)
Glucose-Capillary: 49 mg/dL — ABNORMAL LOW (ref 70–99)
Glucose-Capillary: 97 mg/dL (ref 70–99)
Glucose-Capillary: 58 mg/dL — ABNORMAL LOW (ref 70–99)
Glucose-Capillary: 158 mg/dL — ABNORMAL HIGH (ref 70–99)
Glucose-Capillary: 96 mg/dL (ref 70–99)

## 2022-12-21 LAB — HEPATITIS B SURFACE ANTIGEN: Hepatitis B Surface Ag: NONREACTIVE

## 2022-12-21 MED ORDER — PIPERACILLIN-TAZOBACTAM IN DEX 2-0.25 GM/50ML IV SOLN
2.2500 g | Freq: Three times a day (TID) | INTRAVENOUS | Status: DC
  Administered 2022-12-21 – 2022-12-24 (×10): 2.25 g via INTRAVENOUS
  Filled 2022-12-21 (×12): qty 50

## 2022-12-21 MED ORDER — OXYCODONE HCL 5 MG PO TABS
5.0000 mg | ORAL_TABLET | Freq: Four times a day (QID) | ORAL | Status: AC | PRN
  Administered 2022-12-21 – 2022-12-22 (×2): 5 mg via ORAL
  Filled 2022-12-21 (×2): qty 1

## 2022-12-21 MED ORDER — INSULIN ASPART 100 UNIT/ML IJ SOLN
0.0000 [IU] | Freq: Three times a day (TID) | INTRAMUSCULAR | Status: DC
  Administered 2022-12-24: 1 [IU] via SUBCUTANEOUS

## 2022-12-21 MED ORDER — MIDODRINE HCL 5 MG PO TABS
10.0000 mg | ORAL_TABLET | Freq: Three times a day (TID) | ORAL | Status: DC
  Administered 2022-12-21 – 2022-12-24 (×10): 10 mg via ORAL
  Filled 2022-12-21 (×10): qty 2

## 2022-12-21 MED ORDER — DEXTROSE IN LACTATED RINGERS 5 % IV SOLN
INTRAVENOUS | Status: DC
  Administered 2022-12-22: 20 mL/h via INTRAVENOUS
  Filled 2022-12-21: qty 1000

## 2022-12-21 MED ORDER — HEPARIN SODIUM (PORCINE) 1000 UNIT/ML IJ SOLN
INTRAMUSCULAR | Status: AC
  Filled 2022-12-21: qty 4

## 2022-12-21 MED ORDER — RENA-VITE PO TABS
1.0000 | ORAL_TABLET | Freq: Every day | ORAL | Status: DC
  Administered 2022-12-21 – 2022-12-27 (×6): 1 via ORAL
  Filled 2022-12-21 (×7): qty 1

## 2022-12-21 MED ORDER — ALBUMIN HUMAN 25 % IV SOLN
INTRAVENOUS | Status: AC
  Administered 2022-12-22: 25 g
  Filled 2022-12-21: qty 100

## 2022-12-21 MED ORDER — MIDODRINE HCL 5 MG PO TABS: 5.0000 mg | ORAL_TABLET | Freq: Three times a day (TID) | ORAL | Status: DC

## 2022-12-21 MED ORDER — SODIUM CHLORIDE 0.9 % IV BOLUS
500.0000 mL | Freq: Once | INTRAVENOUS | Status: AC
  Administered 2022-12-21: 500 mL via INTRAVENOUS

## 2022-12-21 MED ORDER — HEPARIN SODIUM (PORCINE) 5000 UNIT/ML IJ SOLN
5000.0000 [IU] | Freq: Three times a day (TID) | INTRAMUSCULAR | Status: DC
  Administered 2022-12-21 – 2022-12-28 (×21): 5000 [IU] via SUBCUTANEOUS
  Filled 2022-12-21 (×22): qty 1

## 2022-12-21 MED ORDER — INSULIN ASPART 100 UNIT/ML IJ SOLN: 0.0000 [IU] | Freq: Every day | INTRAMUSCULAR | Status: DC

## 2022-12-21 MED ORDER — NEPRO/CARBSTEADY PO LIQD: 237.0000 mL | Freq: Two times a day (BID) | ORAL | Status: DC

## 2022-12-21 MED ORDER — ENSURE ENLIVE PO LIQD: 237.0000 mL | Freq: Two times a day (BID) | ORAL | Status: DC

## 2022-12-21 NOTE — Progress Notes (Signed)
Subjective: CC: Tmax 100.1 (650mg  of tylenol at 2032 yesterday). HR 90-100. No systolic hypotension (on midodrine, neo requirements down). Last lactic checked was at 1340 yesterday and stable at 3.1. No labs done today. Looks like she has difficult peripheral access. No prn pain meds other than tylenol. Patient denies any abdominal pain. She reports it resolved last night. Tolerating cld without n/v. Passing flatus. She reports she had a non-bloody bm last night (c/w flowsheets, type 5, brown, large)  Objective: Vital signs in last 24 hours: Temp:  [98.1 F (36.7 C)-100.1 F (37.8 C)] 98.3 F (36.8 C) (05/10 0730) Pulse Rate:  [84-100] 97 (05/10 0700) Resp:  [15-21] 21 (05/10 0700) BP: (87-215)/(31-91) 132/37 (05/10 0700) SpO2:  [95 %-100 %] 100 % (05/10 0700) Weight:  [39.3 kg] 39.3 kg (05/10 0456) Last BM Date : 12/20/22  Intake/Output from previous day: 05/09 0701 - 05/10 0700 In: 1648.4 [P.O.:473; I.V.:675.4; IV Piggyback:500] Out: -  Intake/Output this shift: No intake/output data recorded.  PE: Gen:  Alert, NAD, pleasant Abd: Soft, ND, very mild rlq and suprapubic ttp, otherwise completely NT. No rigidity or guarding, +BS.   Lab Results:  Recent Labs    12/20/22 0918 12/20/22 0946  WBC 8.4  --   HGB 12.9 15.0  HCT 41.3 44.0  PLT 250  --    BMET Recent Labs    12/20/22 0918 12/20/22 0946 12/20/22 1751  NA 130* 130* 135  K 5.6* 5.6* 5.6*  CL 89*  --  97*  CO2 24  --  19*  GLUCOSE 774*  --  247*  BUN 51*  --  57*  CREATININE 5.91*  --  5.87*  CALCIUM 8.6*  --  8.5*   PT/INR No results for input(s): "LABPROT", "INR" in the last 72 hours. CMP     Component Value Date/Time   NA 135 12/20/2022 1751   NA 133 (L) 05/18/2020 0927   K 5.6 (H) 12/20/2022 1751   CL 97 (L) 12/20/2022 1751   CO2 19 (L) 12/20/2022 1751   GLUCOSE 247 (H) 12/20/2022 1751   BUN 57 (H) 12/20/2022 1751   BUN 45 (H) 05/18/2020 0927   CREATININE 5.87 (H) 12/20/2022 1751    CALCIUM 8.5 (L) 12/20/2022 1751   CALCIUM 12.1 (H) 11/13/2021 2026   PROT 8.0 12/20/2022 0918   PROT 7.6 05/18/2020 0927   ALBUMIN 3.2 (L) 12/20/2022 0918   ALBUMIN 3.9 05/18/2020 0927   AST 19 12/20/2022 0918   ALT 20 12/20/2022 0918   ALKPHOS 138 (H) 12/20/2022 0918   BILITOT 0.5 12/20/2022 0918   BILITOT <0.2 05/18/2020 0927   GFRNONAA 9 (L) 12/20/2022 1751   GFRAA 6 (L) 05/18/2020 0927   Lipase     Component Value Date/Time   LIPASE 40 12/20/2022 0918    Studies/Results: CT ABDOMEN PELVIS W CONTRAST  Result Date: 12/20/2022 CLINICAL DATA:  Abdominal pain EXAM: CT ABDOMEN AND PELVIS WITH CONTRAST TECHNIQUE: Multidetector CT imaging of the abdomen and pelvis was performed using the standard protocol following bolus administration of intravenous contrast. RADIATION DOSE REDUCTION: This exam was performed according to the departmental dose-optimization program which includes automated exposure control, adjustment of the mA and/or kV according to patient size and/or use of iterative reconstruction technique. CONTRAST:  75mL OMNIPAQUE IOHEXOL 350 MG/ML SOLN COMPARISON:  CT abdomen and pelvis dated February 09, 2022 FINDINGS: Lower chest: Cardiomegaly. Linear opacity of the right lung base at site of previously seen consolidation, consistent  with scarring. Hepatobiliary: Portal venous gas. Biliary ductal dilation, unchanged when compared with the prior exam. Gallstones with no evidence of gallbladder wall thickening. Pancreas: Atrophic. No pancreatic ductal dilatation or surrounding inflammatory changes. Spleen: Normal in size without focal abnormality. Adrenals/Urinary Tract: Atrophic bilateral kidneys. No hydronephrosis. Bladder wall thickening. Stomach/Bowel: Wall thickening and pneumatosis of the transverse and proximal descending colon. Gas seen in the mesenteric veins adjacent to the transverse colon. No evidence of obstruction. Normal appendix. Vascular/Lymphatic: Severe aortic atherosclerosis,  including severe atherosclerotic disease of the distal branch vessels of the SMA and celiac arteries. No enlarged abdominal or pelvic lymph nodes. Reproductive: Uterus and bilateral adnexa are unremarkable. Other: No abdominal wall hernia or abnormality. No abdominopelvic ascites. Musculoskeletal: No acute or significant osseous findings. IMPRESSION: 1. Wall thickening and pneumatosis of the transverse and proximal descending colon with associated mesenteric venous and portal venous gas, findings are highly concerning for bowel ischemia. 2. Severe aortic atherosclerosis (ICD10-I70.0), including severe atherosclerotic disease of the distal branch vessels of the SMA and celiac arteries. 3. Bladder wall thickening, findings can be seen in the setting of cystitis. Correlate with urinalysis. Critical Value/emergent results were called by telephone at the time of interpretation on 12/20/2022 at 11:34 am to [facility] PA , who verbally acknowledged these results. Electronically Signed   By: Allegra Lai M.D.   On: 12/20/2022 11:38   DG Chest Port 1 View  Result Date: 12/20/2022 CLINICAL DATA:  Abdominal pain EXAM: PORTABLE CHEST 1 VIEW COMPARISON:  CXR 07/23/22 FINDINGS: No pleural effusion. No pneumothorax. No focal airspace opacity. No radiographically apparent displaced rib fractures. Visualized upper abdomen is unremarkable. Normal cardiac and mediastinal contours. IMPRESSION: No focal airspace opacity Electronically Signed   By: Lorenza Cambridge M.D.   On: 12/20/2022 10:15    Anti-infectives: Anti-infectives (From admission, onward)    Start     Dose/Rate Route Frequency Ordered Stop   12/21/22 0800  piperacillin-tazobactam (ZOSYN) IVPB 2.25 g        2.25 g 100 mL/hr over 30 Minutes Intravenous Every 8 hours 12/21/22 0708     12/20/22 2200  piperacillin-tazobactam (ZOSYN) IVPB 2.25 g  Status:  Discontinued       See Hyperspace for full Linked Orders Report.   2.25 g 100 mL/hr over 30 Minutes  Intravenous Every 8 hours 12/20/22 1324 12/21/22 0708   12/20/22 1330  piperacillin-tazobactam (ZOSYN) IVPB 3.375 g       See Hyperspace for full Linked Orders Report.   3.375 g 100 mL/hr over 30 Minutes Intravenous  Once 12/20/22 1324 12/20/22 1409        Assessment/Plan Possible mesenteric ischemia  - CT with pneumatosis of transverse and proximal descending colon with associated mesenteric and portal venous gas; 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 Patient is s/p dialysis 5/8, pain began after this.  - Appears to be improving clinically - decreasing pressor requirement; pain resolved; tolerating cld; having non-bloody bm's; reassuring exam as above with no peritonitis. Would like to see repeat labs to ensure these are improving as well. Do not recommend emergent operative intervention. Cont abx. Okay for FLD. Hopefully this will continue to improve. We will follow closely. Should she worsen, she would need exploratory surgery with bowel resection and ultimately ostomy creation which our team has discussed with her. Discussed with RN things to look out for that may indicate she is worsening and to call us back for re-examination sooner/before tomorrow am. We will follow with you.  FEN: FLD, IVF per primary  ID: [email or web address] VTE: okay for chemical prophylaxis   - below per TRH/CCM -  T2DM with hyperglycemia - improved, off insuline gtt ESRD on HD Hx of STEMI BL AKA HTN Hx of CVA on asa and plavix per chart review  I reviewed nursing notes, hospitalist notes, last 24 h vitals and pain scores, last 48 h intake and output, last 24 h labs and trends, and last 24 h imaging results.   LOS: 1 day    Jacinto Halim , [facility] 12/21/2022, 8:26 AM Please see Amion for pager number during day hours 7:00am-4:30pm

## 2022-12-21 NOTE — H&P (Signed)
NAME:  [name], MRN:  [id number], DOB:  09-03-1980, LOS: 1 ADMISSION DATE:  12/20/2022, CONSULTATION DATE:  12/20/22 REFERRING MD:  EDP, CHIEF COMPLAINT:  Abd pain   History of Present Illness:  42 yo female presented to ER with abdominal pain for 1 day.  Had elevated blood pressure and blood sugar in ER.  CT abdomen showed signs of ischemic colitis and elevated lactic acid.  Surgery recommended conservative management.  PCCM consulted to arrange for ICU admission.  Pertinent  Medical History  DM type 2, CVA, b/l BKA, ESRD on HD, Anemia, GBS Miller Fischer variant, Bed bugs, CAD, PAD  Significant Hospital Events: Including procedures, antibiotic start and stop dates in addition to other pertinent events   5/09 admit 5/10 off pressors, transfer to floor med   Interim History / Subjective:  Wants to sleep.  Abdominal pain better.  Not having chest pain, dyspnea, or nausea.  Refusing to get lab work done because she wants to sleep.  Objective   Blood pressure (!) 132/37, pulse 97, temperature 98.3 F (36.8 C), temperature source Oral, resp. rate (!) 21, weight 39.3 kg, SpO2 100 %.        Intake/Output Summary (Last 24 hours) at 12/21/2022 0844 Last data filed at 12/21/2022 3244 Gross per 24 hour  Intake 1648.36 ml  Output --  Net 1648.36 ml   Filed Weights   12/21/22 0456  Weight: 39.3 kg    Examination:  General - alert Eyes - pupils reactive ENT - no sinus tenderness, no stridor Cardiac - regular rate/rhythm, no murmur Chest - equal breath sounds b/l, no wheezing or rales Abdomen - soft, non tender, + bowel sounds Extremities - b/l BKA, AV graft Rt arm Skin - no rashes Neuro - normal strength, moves extremities, follows commands  Resolved Hospital Problem list   N/A  Assessment & Plan:   Ischemic colitis. - appreciate help from surgery - continue zosyn  ESRD. Hyperkalemia. Non gap metabolic acidosis. - iHD M/W/F - consult nephrology for HD - f/u BMET -  gets midodrine with HD  HTN, HLD, PAD. - hold outpt norvasc, ASA, lipitor, imdur, lopressor, plavix for now  DM type 2 poorly controlled with hyperglycemia. - SSI  Elevated beta HCG. - ? Significance - will repeat level  Transfer to floor bed.  Will ask [facility] to assume care from 5/11 and PCCM off.  Best Practice (right click and "Reselect all SmartList Selections" daily)   Diet/type: full liquids  DVT prophylaxis: prophylactic heparin  GI prophylaxis: N/A Lines: N/A Foley:  N/A Code Status:  full code Last date of multidisciplinary goals of care discussion [N/A]  Labs       Latest Ref Rng & Units 12/20/2022    5:51 PM 12/20/2022    9:46 AM 12/20/2022    9:18 AM  CMP  Glucose 70 - 99 mg/dL 010   272   BUN 6 - 20 mg/dL 57   51   Creatinine 5.36 - 1.00 mg/dL 6.44   0.34   Sodium 742 - 145 mmol/L 135  130  130   Potassium 3.5 - 5.1 mmol/L 5.6  5.6  5.6   Chloride 98 - 111 mmol/L 97   89   CO2 22 - 32 mmol/L 19   24   Calcium 8.9 - 10.3 mg/dL 8.5   8.6   Total Protein 6.5 - 8.1 g/dL   8.0   Total Bilirubin 0.3 - 1.2 mg/dL   0.5  Alkaline Phos 38 - 126 U/L   138   AST 15 - 41 U/L   19   ALT 0 - 44 U/L   20        Latest Ref Rng & Units 12/20/2022    9:46 AM 12/20/2022    9:18 AM 07/27/2022    4:40 PM  CBC  WBC 4.0 - 10.5 K/uL  8.4  9.4   Hemoglobin 12.0 - 15.0 g/dL 19.1  47.8  8.4   Hematocrit 36.0 - 46.0 % 44.0  41.3  27.6   Platelets 150 - 400 K/uL  250  205     ABG    Component Value Date/Time   PHART 7.440 09/22/2021 1134   PCO2ART 34.8 09/22/2021 1134   PO2ART 63.6 (L) 09/22/2021 1134   HCO3 30.3 (H) 12/20/2022 0946   TCO2 32 12/20/2022 0946   ACIDBASEDEF 0.4 09/22/2021 1134   O2SAT 79 12/20/2022 0946    CBG (last 3)  Recent Labs    12/20/22 2318 12/21/22 0313 12/21/22 0729  GLUCAP 94 99 92   Signature:  Coralyn Helling, MD [place] [facility]/Critical [facility] Pager - [phone] or [phone] 12/21/2022, 9:02 AM

## 2022-12-21 NOTE — Consult Note (Signed)
[facility] Renal Consultation Note    Indication for Consultation:  Management of ESRD/hemodialysis, anemia, hypertension/volume, and secondary hyperparathyroidism. PCP:  HPI: [name] is a 42 y.o. female with ESRD, HTN, T2DM, Hx B BKA, Hx CVA, Hx GBS who was admitted with intestinal ischemia.  Sent to ED from SNF for blood in stool and abdominal pain. On arrival, she was hypertensive, but not hypoxic or febrile. Labs with Glu > 600, Na 130, K 5.6, Co2 24, BUN 51, Cr 5.9, Ca 8.6, normal LFTs, FOBT positive. CXR clear. Abd CT showed "wall thickening and pneumatosis of the transverse and proximal descending colon with associated mesenteric venous and portal venous gas, findings are highly concerning for bowel ischemia." Surgery was consulted - monitoring for now, but plam is for exploratory surgery with bowel resection and ostomy creation. She was started on IV Zosyn and admitted to the ICU.  Seen in her bed this AM - some abdominal pain which she states is unchanged, although she appears quite comfortable. She is hyPOtensive. Midodrine started and may be getting pressors as well. No CP, dyspnea at this time. She does not recall any recent HD issues as outpatient. Dialyzes on MWF schedule at [facility] clinic - she is due for HD today, uses RUE AVF as her access.  Past Medical History:  Diagnosis Date   Anemia    Anxiety    Blind right eye 2008   Diabetes mellitus without complication (HCC)    Embolic stroke (HCC)    ESRD (end stage renal disease) (HCC)    HD on M/W/F   ESRD on hemodialysis (HCC)    GBS (Guillain Barre syndrome) (HCC)    Past Surgical History:  Procedure Laterality Date   AMPUTATION Right 08/11/2021   Procedure: TRANSMETATARSAL AMPUTATION OF RIGHT FOOT;  Surgeon: Nada Libman, MD;  Location: [facility] OR;  Service: Vascular;  Laterality: Right;   AMPUTATION Right 09/17/2021   Procedure: AMPUTATION RIGHT BELOW KNEE;  Surgeon: Nada Libman, MD;   Location: [facility] OR;  Service: Vascular;  Laterality: Right;   AMPUTATION Right 10/11/2021   Procedure: RIGHT ABOVE KNEE AMPUTATION;  Surgeon: Nada Libman, MD;  Location: [facility] OR;  Service: Vascular;  Laterality: Right;   AMPUTATION Left 07/02/2022   Procedure: LEFT ABOVE KNEE AMPUTATION;  Surgeon: Chuck Hint, MD;  Location: [facility] OR;  Service: Vascular;  Laterality: Left;   AMPUTATION TOE Left    APPLICATION OF WOUND VAC  08/16/2021   Procedure: APPLICATION OF WOUND VAC;  Surgeon: Nada Libman, MD;  Location: [facility] OR;  Service: Vascular;;   AV FISTULA PLACEMENT     BUBBLE STUDY  10/02/2021   Procedure: BUBBLE STUDY;  Surgeon: Pricilla Riffle, MD;  Location: [facility] ENDOSCOPY;  Service: Cardiovascular;;   CESAREAN SECTION  2011   FISTULA SUPERFICIALIZATION Right 07/27/2019   Procedure: 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;  Surgeon: Chuck Hint, MD;  Location: [facility] OR;  Service: Vascular;  Laterality: Right;   INSERTION OF DIALYSIS CATHETER Left 07/27/2019   Procedure: INSERTION OF TUNNELED  DIALYSIS CATHETER;  Surgeon: Chuck Hint, MD;  Location: [facility] OR;  Service: Vascular;  Laterality: Left;   INSERTION OF DIALYSIS CATHETER Left 10/23/2021   Procedure: INSERTION OF TUNNELED PALINDROME PRECISION DIALYSIS CATHETER (23cm);  Surgeon: Cephus Shelling, MD;  Location: [facility] OR;  Service: Vascular;  Laterality: Left;   IR FLUORO GUIDE CV LINE LEFT  10/16/2021   IR FLUORO GUIDE CV LINE RIGHT  10/11/2021   IR GASTROSTOMY TUBE MOD SED  10/30/2021   IR GASTROSTOMY TUBE REMOVAL  05/08/2022   IR US GUIDE VASC ACCESS LEFT  10/16/2021   IR US GUIDE VASC ACCESS RIGHT  10/11/2021   LEFT HEART CATH AND CORONARY ANGIOGRAPHY N/A 07/26/2022   Procedure: LEFT HEART CATH AND CORONARY ANGIOGRAPHY;  Surgeon: Swaziland, Peter M, MD;  Location: [facility] INVASIVE CV LAB;  Service: Cardiovascular;  Laterality: N/A;   LOWER EXTREMITY ANGIOGRAPHY N/A 08/11/2021   Procedure: LOWER EXTREMITY ANGIOGRAPHY;   Surgeon: Nada Libman, MD;  Location: [facility] INVASIVE CV LAB;  Service: Cardiovascular;  Laterality: N/A;   REVISON OF ARTERIOVENOUS FISTULA Right 10/23/2021   Procedure: REVISON OF ARTERIOVENOUS FISTULA  ARM AND PLICATION;  Surgeon: Cephus Shelling, MD;  Location: [facility] OR;  Service: Vascular;  Laterality: Right;   TEE WITHOUT CARDIOVERSION N/A 10/02/2021   Procedure: TRANSESOPHAGEAL ECHOCARDIOGRAM (TEE);  Surgeon: Pricilla Riffle, MD;  Location: [facility] ENDOSCOPY;  Service: Cardiovascular;  Laterality: N/A;   TRANSMETATARSAL AMPUTATION Right 08/16/2021   Procedure: CLOSURE OF RIGHT TRANSMETATARSAL AMPUTATION;  Surgeon: Nada Libman, MD;  Location[facility] OR;  Service: Vascular;  Laterality: Right;   Family History  Problem Relation Age of Onset   Diabetes Mother    Diabetes Father    Social History:  reports that she has never smoked. She has never used smokeless tobacco. She reports that she does not drink alcohol and does not use drugs.  ROS: As per HPI otherwise negative.  Physical Exam: Vitals:   12/21/22 1100 12/21/22 1112 12/21/22 1130 12/21/22 1200  BP: 93/67  (!) 97/42 (!) 100/56  Pulse: 88  90 87  Resp: 17  16 17   Temp:  98.3 F (36.8 C)    TempSrc:  Oral    SpO2: 98%  100% 100%  Weight:         General: Chronically ill appearing woman. NAD. Nasal O2 in place. Head: Normocephalic, atraumatic, sclera non-icteric, mucus membranes are moist. Neck: Supple without lymphadenopathy/masses. JVD not elevated. Lungs: Clear bilaterally to auscultation without wheezes, rales, or rhonchi.  Heart: RRR; 2/6 murmur Abdomen: Soft, mild generalized tenderness - minimally examined Lower extremities: B AKA; no edema or ischemic changes, no open wounds. Neuro: Alert and oriented X 3. Moves all extremities spontaneously. Psych:  Responds to questions appropriately with a normal affect. Dialysis Access: RUE AVF + bruit  Allergies  Allergen Reactions   Morphine Rash and Other (See Comments)    Peanut-Containing Drug Products Anaphylaxis and Hives   Penicillins Rash and Other (See Comments)    Rash in 2008.  Tolerated cefazolin in 2020   Chocolate Hives   Shellfish Allergy Swelling   Prior to Admission medications   Medication Sig Start Date End Date Taking? Authorizing Provider  acetaminophen (TYLENOL) 325 MG tablet Take 650 mg by mouth every 6 (six) hours as needed for fever.   Yes [provider]  amLODipine (NORVASC) 2.5 MG tablet Take 2.5 mg by mouth daily. 11/26/22  Yes [provider]  aspirin EC 81 MG tablet Take 1 tablet (81 mg total) by mouth daily. Swallow whole. 07/27/22  Yes Vann, Jessica U, DO  atorvastatin (LIPITOR) 40 MG tablet Place 1 tablet (40 mg total) into feeding tube daily. 10/26/21  Yes Burnadette Pop, MD  azelastine (OPTIVAR) 0.05 % ophthalmic solution Place 1 drop into both eyes 2 (two) times daily as needed (allergies). 11/13/22  Yes [provider]  cetirizine (ZYRTEC) 10 MG tablet Take 10  mg by mouth daily.   Yes [provider]  cinacalcet (SENSIPAR) 90 MG tablet Take 90 mg by mouth daily. 09/22/19  Yes [provider]  docusate sodium (COLACE) 100 MG capsule Take 100 mg by mouth at bedtime.   Yes [provider]  dorzolamide-timolol (COSOPT) 22.3-6.8 MG/ML ophthalmic solution Place 1 drop into the left eye 2 (two) times daily. 03/30/21  Yes [provider]  Glucagon, rDNA, (GLUCAGON EMERGENCY) 1 MG KIT Inject 1 mg into the muscle once. If BS drops below 60 06/10/22  Yes [provider]  insulin aspart (NOVOLOG) 100 UNIT/ML injection Inject 0-6 Units into the skin every 4 (four) hours. Patient taking differently: Inject 0-10 Units into the skin 3 (three) times daily with meals. 121-150=1 unit 152-200=2 units 201-250=3 units 251-300=5 units 301-350=7 units 351-400=10 units 10/25/21  Yes Adhikari, Amrit, MD  insulin glargine (SEMGLEE) 100 UNIT/ML injection Inject 30 Units into the skin  daily at 6 PM.   Yes [provider]  isosorbide mononitrate (IMDUR) 30 MG 24 hr tablet Take 1 tablet (30 mg total) by mouth daily. 07/27/22  Yes Vann, Jessica U, DO  melatonin 3 MG TABS tablet Take 3 mg by mouth at bedtime.   Yes [provider]  metoprolol tartrate (LOPRESSOR) 25 MG tablet Take 0.5 tablets (12.5 mg total) by mouth 2 (two) times daily. Patient taking differently: Take 25 mg by mouth 2 (two) times daily. Hold for SBP less than 110 or heart rate less than 60 07/27/22  Yes Vann, Jessica U, DO  midodrine (PROAMATINE) 5 MG tablet Take 1 tablet (5 mg total) by mouth 3 (three) times daily as needed (low blood pressure). 07/27/22  Yes Joseph Art, DO  Multiple Vitamin (MULTIVITAMIN WITH MINERALS) TABS tablet Take 1 tablet by mouth daily.   Yes [provider]  ondansetron (ZOFRAN) 4 MG tablet Take 4 mg by mouth every 6 (six) hours as needed for nausea/vomiting. 03/02/22  Yes [provider]  oxyCODONE-acetaminophen (PERCOCET) 5-325 MG tablet Take 1 tablet by mouth every 8 (eight) hours as needed for severe pain. 07/27/22  Yes Vann, Jessica U, DO  polyethylene glycol (MIRALAX / GLYCOLAX) 17 g packet Take 17 g by mouth daily at 6 PM.   Yes [provider]  Accu-Chek FastClix Lancets MISC Use as instructed to check blood sugar up to TID. E11.22 Patient taking differently: 1 each by Other route See admin instructions. Use as instructed to check blood sugar up to TID. E11.22 04/15/19   Hoy Register, MD  Blood Glucose Monitoring Suppl (ACCU-CHEK GUIDE ME) w/Device KIT 1 kit by Does not apply route 3 (three) times daily. Use to check BG at home up to 3 times daily. E11.22 04/15/19   Hoy Register, MD  clopidogrel (PLAVIX) 75 MG tablet Take 1 tablet (75 mg total) by mouth daily. Patient not taking: Reported on 12/20/2022 07/27/22   Joseph Art, DO  Continuous Blood Gluc Transmit (DEXCOM G6 TRANSMITTER) MISC 1 Device by Does not apply route as  directed. 09/12/20   Shamleffer, Konrad Dolores, MD  glucose blood (ACCU-CHEK GUIDE) test strip Use as instructed to check blood sugar up to TID. E11.22 Patient taking differently: 1 each by Other route See admin instructions. Use as instructed to check blood sugar up to TID. E11.22 07/17/19   Fulp, Cammie, MD  Insulin Pen Needle 32G X 4 MM MISC 1 Device by Does not apply route as directed. 05/13/20   Shamleffer, Konrad Dolores, MD  Nutritional Supplements (NUTRITIONAL DRINK) LIQD Take 120 mLs by mouth daily.    [provider]  nystatin (MYCOSTATIN) 100000 UNIT/ML suspension Take 5 mLs (500,000 Units total) by mouth 4 (four) times daily. Patient not taking: Reported on 12/20/2022 10/25/21   Burnadette Pop, MD  sodium chloride flush (NS) 0.9 % SOLN Inject 10 mLs into the vein at bedtime.    [provider]  thiamine 100 MG tablet Place 1 tablet (100 mg total) into feeding tube daily. Patient not taking: Reported on 12/20/2022 10/26/21   Burnadette Pop, MD   Current Facility-Administered Medications  Medication Dose Route Frequency Provider Last Rate Last Admin   0.9 %  sodium chloride infusion  250 mL Intravenous Continuous Ogan, Thomasene Lot, MD       acetaminophen (TYLENOL) tablet 650 mg  650 mg Oral Q4H PRN Migdalia Dk, MD   650 mg at 12/20/22 2032   Chlorhexidine Gluconate Cloth 2 % PADS 6 each  6 each Topical Daily Lorin Glass, MD   6 each at 12/21/22 0953   dextrose 5 % in lactated ringers infusion   Intravenous Continuous Coralyn Helling, MD 20 mL/hr at 12/21/22 1100 New Bag at 12/21/22 1100   dextrose 50 % solution 0-50 mL  0-50 mL Intravenous PRN Dartha Lodge, PA-C   50 mL at 12/21/22 1100   docusate sodium (COLACE) capsule 100 mg  100 mg Oral BID PRN Lorin Glass, MD       dorzolamide-timolol (COSOPT) 2-0.5 % ophthalmic solution 1 drop  1 drop Left Eye BID Lorin Glass, MD       feeding supplement (NEPRO CARB STEADY) liquid 237 mL  237 mL Oral BID BM Coralyn Helling, MD       heparin injection 5,000 Units  5,000 Units Subcutaneous Q8H Coralyn Helling, MD   5,000 Units at 12/21/22 0954   insulin aspart (novoLOG) injection 0-5 Units  0-5 Units Subcutaneous QHS Coralyn Helling, MD       insulin aspart (novoLOG) injection 0-6 Units  0-6 Units Subcutaneous TID WC Coralyn Helling, MD       labetalol (NORMODYNE) injection 10 mg  10 mg Intravenous Q2H PRN Lorin Glass, MD       midodrine (PROAMATINE) tablet 10 mg  10 mg Oral Q8H Coralyn Helling, MD   10 mg at 12/21/22 9811   multivitamin (RENA-VIT) tablet 1 tablet  1 tablet Oral QHS Coralyn Helling, MD       piperacillin-tazobactam (ZOSYN) IVPB 2.25 g  2.25 g Intravenous Q8H Dang, Thuy D, RPH 100 mL/hr at 12/21/22 0945 2.25 g at 12/21/22 0945   polyethylene glycol (MIRALAX / GLYCOLAX) packet 17 g  17 g Oral Daily PRN Lorin Glass, MD       Labs: Basic Metabolic Panel: Recent Labs  Lab 12/20/22 0918 12/20/22 0946 12/20/22 1751  NA 130* 130* 135  K 5.6* 5.6* 5.6*  CL 89*  --  97*  CO2 24  --  19*  GLUCOSE 774*  --  247*  BUN 51*  --  57*  CREATININE 5.91*  --  5.87*  CALCIUM 8.6*  --  8.5*   Liver Function Tests: Recent Labs  Lab 12/20/22 0918  AST 19  ALT 20  ALKPHOS 138*  BILITOT 0.5  PROT 8.0  ALBUMIN 3.2*   Recent Labs  Lab 12/20/22 0918  LIPASE 40   CBC: Recent Labs  Lab 12/20/22 0918 12/20/22 0946  WBC 8.4  --  NEUTROABS 7.6  --   HGB 12.9 15.0  HCT 41.3 44.0  MCV 81.1  --   PLT 250  --    CBG: Recent Labs  Lab 12/20/22 2159 12/20/22 2318 12/21/22 0313 12/21/22 0729 12/21/22 1111  GLUCAP 93 94 99 92 49*   Studies/Results: CT ABDOMEN PELVIS W CONTRAST  Result Date: 12/20/2022 CLINICAL DATA:  Abdominal pain EXAM: CT ABDOMEN AND PELVIS WITH CONTRAST TECHNIQUE: Multidetector CT imaging of the abdomen and pelvis was performed using the standard protocol following bolus administration of intravenous contrast. RADIATION DOSE REDUCTION: This exam was performed according to  the departmental dose-optimization program which includes automated exposure control, adjustment of the mA and/or kV according to patient size and/or use of iterative reconstruction technique. CONTRAST:  75mL OMNIPAQUE IOHEXOL 350 MG/ML SOLN COMPARISON:  CT abdomen and pelvis dated February 09, 2022 FINDINGS: Lower chest: Cardiomegaly. Linear opacity of the right lung base at site of previously seen consolidation, consistent with scarring. Hepatobiliary: Portal venous gas. Biliary ductal dilation, unchanged when compared with the prior exam. Gallstones with no evidence of gallbladder wall thickening. Pancreas: Atrophic. No pancreatic ductal dilatation or surrounding inflammatory changes. Spleen: Normal in size without focal abnormality. Adrenals/Urinary Tract: Atrophic bilateral kidneys. No hydronephrosis. Bladder wall thickening. Stomach/Bowel: Wall thickening and pneumatosis of the transverse and proximal descending colon. Gas seen in the mesenteric veins adjacent to the transverse colon. No evidence of obstruction. Normal appendix. Vascular/Lymphatic: Severe aortic atherosclerosis, including severe atherosclerotic disease of the distal branch vessels of the SMA and celiac arteries. No enlarged abdominal or pelvic lymph nodes. Reproductive: Uterus and bilateral adnexa are unremarkable. Other: No abdominal wall hernia or abnormality. No abdominopelvic ascites. Musculoskeletal: No acute or significant osseous findings. IMPRESSION: 1. Wall thickening and pneumatosis of the transverse and proximal descending colon with associated mesenteric venous and portal venous gas, findings are highly concerning for bowel ischemia. 2. Severe aortic atherosclerosis (ICD10-I70.0), including severe atherosclerotic disease of the distal branch vessels of the SMA and celiac arteries. 3. Bladder wall thickening, findings can be seen in the setting of cystitis. Correlate with urinalysis. Critical Value/emergent results were called by  telephone at the time of interpretation on 12/20/2022 at 11:34 am to [facility] , who verbally acknowledged these results. Electronically Signed   By: Allegra Lai M.D.   On: 12/20/2022 11:38   DG Chest Port 1 View  Result Date: 12/20/2022 CLINICAL DATA:  Abdominal pain EXAM: PORTABLE CHEST 1 VIEW COMPARISON:  CXR 07/23/22 FINDINGS: No pleural effusion. No pneumothorax. No focal airspace opacity. No radiographically apparent displaced rib fractures. Visualized upper abdomen is unremarkable. Normal cardiac and mediastinal contours. IMPRESSION: No focal airspace opacity Electronically Signed   By: Lorenza Cambridge M.D.   On: 12/20/2022 10:15    Dialysis Orders:  MWF at [facility] - last HD 5/8 3:45hr, 400/A1.5, EDW 49.5kg, 2K/2Ca, AVF, UFP #2, no heparin - Mircera IV q 2 weeks - last 5/3 - Venofer 50mg  IV weekly  Assessment/Plan:  Bowel ischemia per CT 12/20/22: Blood in stool. NPO, surgery following.   HyPOtension: Midodrine started, may need pressors. No edema on exam.  Hyperglycemia: Insulin drip started on admit, improving.  ESRD:  Usual MWF schedule - will attempt HD today, minimal UFG, no heparin. If unable to tolerate, she may need CRRT.  Anemia: Hgb high for now, watch trends - no ESA at this time.  Metabolic bone disease: Ca ok, resume binder once allowed to eat  T2DM  Hx CVA  Hx B  AKA  Ozzie Hoyle, PA-C 12/21/2022, 12:21 PM  [employer]

## 2022-12-21 NOTE — Progress Notes (Signed)
BP soft.  Will give fluid bolus.  Concerned she might need pressors for HD.  Keep in ICU for now.  Coralyn Helling, MD [facility] Pulmonary/Critical Care Pager - [phone] or [phone] 12/21/2022, 1:13 PM

## 2022-12-21 NOTE — Progress Notes (Signed)
Initial Nutrition Assessment  DOCUMENTATION CODES:   Non-severe (moderate) malnutrition in context of chronic illness  INTERVENTION:   - Nepro Shake po BID, each supplement provides 425 kcal and 19 grams protein  - Renal MVI daily  - Encourage PO intake  NUTRITION DIAGNOSIS:   Moderate Malnutrition related to chronic illness (ESRD/HD) as evidenced by mild muscle depletion, percent weight loss (18.3% weight loss in < 6 months).  GOAL:   Patient will meet greater than or equal to 90% of their needs  MONITOR:   PO intake, Supplement acceptance, Diet advancement, Labs, Weight trends, I & O's  REASON FOR ASSESSMENT:   Malnutrition Screening Tool    ASSESSMENT:   42 year old female who presented to the ED on 5/09 with abdominal pain, nausea. PMH of ESRD on HD, STEMI, bilateral AKA, osteomyelitis, T2DM, HTN, CVA, PEG tube placement and later removal. Pt admitted with possible mesenteric ischemia.  05/10 - full liquid diet  Spoke with pt at bedside. Pt sleeping at time of RD visit but did awaken briefly to answer RD questions.  Pt reports that she receives and eats 3 meals daily at SNF. For breakfast, pt may have eggs, sausage, and toast. Pt reports that what she has for lunch and dinner varies and was unable/unwilling to provide more details. Pt states that she does eat "full meals."  Pt is unsure of her UBW or EDW. She does not know if she has lost weight. Per review of chart, pt had a R AKA on 10/11/21 and a L AKA on 07/02/22. Using weight from 07/04/22 (after both procedures) as a baseline weight, pt has lost 8.8 kg since that date. This is an 18.3% weight loss in less than 6 months which is severe and significant for timeframe.  RD also noted that pt had a G-tube placed by IR on 10/30/21. This was removed on 05/08/22.  Pt states that she likes oral nutrition supplements and prefers the vanilla flavor. RD to order. Will also order daily renal MVI with minerals.  Medications  reviewed and include: SSI, IV abx  Labs reviewed: potassium 5.6, lactic acid 3.1, hemoglobin A1C 8.2 CBG's: 65-99 x 12 hours  I/O's: +1.7 L since admit  NUTRITION - FOCUSED PHYSICAL EXAM:  Flowsheet Row Most Recent Value  Orbital Region No depletion  Upper Arm Region No depletion  Thoracic and Lumbar Region Mild depletion  Buccal Region No depletion  Temple Region No depletion  Clavicle Bone Region Mild depletion  Clavicle and Acromion Bone Region Mild depletion  Scapular Bone Region Mild depletion  Dorsal Hand Mild depletion  Patellar Region Unable to assess  [bilateral AKA]  Anterior Thigh Region Unable to assess  [bilateral AKA]  Posterior Calf Region Unable to assess  [bilateral AKA]  Edema (RD Assessment) Mild  Hair Reviewed  Eyes Reviewed  Mouth Reviewed  Skin Reviewed  Nails Reviewed   Diet Order:   Diet Order             Diet full liquid Fluid consistency: Thin  Diet effective now                   EDUCATION NEEDS:   Education needs have been addressed  Skin:  Skin Assessment: Reviewed RN Assessment (skin tear to groin)  Last BM:  12/20/22 large type 5  Height:   Ht Readings from Last 1 Encounters:  09/04/22 5\' 1"  (1.549 m)    Weight:   Wt Readings from Last 1 Encounters:  12/21/22 39.3  kg    BMI:  Body mass index is 16.37 kg/m. Adjusted BMI: 20.3 kg/m (adjusted for bilateral AKA)  Estimated Nutritional Needs:   Kcal:  1500-1700  Protein:  70-80 grams  Fluid:  1000 ml + UOP    Mertie Clause, MS, RD, LDN Inpatient Clinical Dietitian Please see AMiON for contact information.

## 2022-12-21 NOTE — Inpatient Diabetes Management (Signed)
Inpatient Diabetes Program Recommendations  AACE/ADA: New Consensus Statement on Inpatient Glycemic Control (2015)  Target Ranges:  Prepandial:   less than 140 mg/dL      Peak postprandial:   less than 180 mg/dL (1-2 hours)      Critically ill patients:  140 - 180 mg/dL   Lab Results  Component Value Date   GLUCAP 49 (L) 12/21/2022   HGBA1C 8.2 (H) 12/20/2022    Review of Glycemic Control  Latest Reference Range & Units 12/20/22 18:40 12/20/22 19:27 12/20/22 20:56 12/20/22 21:59 12/20/22 23:18 12/21/22 03:13 12/21/22 07:29 12/21/22 11:11  Glucose-Capillary 70 - 99 mg/dL 74 010 (H) 65 (L) 93 94 99 92 49 (L)   Diabetes history: DM 1  Outpatient Diabetes medications:  Novolog 0-10 units tid with meals Semglee 30 units daily Current orders for Inpatient glycemic control:  Novolog 0-6 units tid with meals and HS  Inpatient Diabetes Program Recommendations:    Patients blood sugar came down rapidly with IV insulin- She is labeled as a Type 1 and takes Lantus 30 units daily. Consider increasing frequency of Novolog correction to q 4 hours and if blood sugars increase to >180 mg/dL, please add Semglee 10 units daily.   Thanks,  Beryl Meager, RN, BC-ADM Inpatient Diabetes Coordinator Pager [phone]  (8a-5p)

## 2022-12-22 ENCOUNTER — Inpatient Hospital Stay (HOSPITAL_COMMUNITY): Payer: Medicare Other

## 2022-12-22 DIAGNOSIS — K559 Vascular disorder of intestine, unspecified: Secondary | ICD-10-CM | POA: Diagnosis not present

## 2022-12-22 LAB — GLUCOSE, CAPILLARY
Glucose-Capillary: 41 mg/dL — CL (ref 70–99)
Glucose-Capillary: 144 mg/dL — ABNORMAL HIGH (ref 70–99)
Glucose-Capillary: 71 mg/dL (ref 70–99)
Glucose-Capillary: 93 mg/dL (ref 70–99)
Glucose-Capillary: 76 mg/dL (ref 70–99)
Glucose-Capillary: 61 mg/dL — ABNORMAL LOW (ref 70–99)

## 2022-12-22 LAB — HCG, QUANTITATIVE, PREGNANCY: hCG, Beta Chain, Quant, S: 10 m[IU]/mL — ABNORMAL HIGH (ref <5)

## 2022-12-22 LAB — BASIC METABOLIC PANEL
Anion gap: 18 — ABNORMAL HIGH (ref 5–15)
BUN: 82 mg/dL — ABNORMAL HIGH (ref 6–20)
CO2: 22 mmol/L (ref 22–32)
Calcium: 8.5 mg/dL — ABNORMAL LOW (ref 8.9–10.3)
Chloride: 97 mmol/L — ABNORMAL LOW (ref 98–111)
Creatinine, Ser: 7.86 mg/dL — ABNORMAL HIGH (ref 0.44–1.00)
GFR, Estimated: 6 mL/min — ABNORMAL LOW (ref >60)
Glucose, Bld: 50 mg/dL — ABNORMAL LOW (ref 70–99)
Potassium: 5.6 mmol/L — ABNORMAL HIGH (ref 3.5–5.1)
Sodium: 137 mmol/L (ref 135–145)

## 2022-12-22 LAB — CBC
HCT: 31.8 % — ABNORMAL LOW (ref 36.0–46.0)
Hemoglobin: 10 g/dL — ABNORMAL LOW (ref 12.0–15.0)
MCH: 25.5 pg — ABNORMAL LOW (ref 26.0–34.0)
MCHC: 31.4 g/dL (ref 30.0–36.0)
MCV: 81.1 fL (ref 80.0–100.0)
Platelets: 232 10*3/uL (ref 150–400)
RBC: 3.92 MIL/uL (ref 3.87–5.11)
RDW: 19.9 % — ABNORMAL HIGH (ref 11.5–15.5)
WBC: 15.4 10*3/uL — ABNORMAL HIGH (ref 4.0–10.5)
nRBC: 0 % (ref 0.0–0.2)

## 2022-12-22 LAB — LACTIC ACID, PLASMA
Lactic Acid, Venous: 1.1 mmol/L (ref 0.5–1.9)
Lactic Acid, Venous: 0.9 mmol/L (ref 0.5–1.9)

## 2022-12-22 LAB — HEPATITIS B SURFACE ANTIBODY, QUANTITATIVE: Hep B S AB Quant (Post): 6611 m[IU]/mL (ref >9.9)

## 2022-12-22 MED ORDER — HYDROMORPHONE HCL 1 MG/ML IJ SOLN
0.5000 mg | Freq: Once | INTRAMUSCULAR | Status: AC
  Administered 2022-12-22: 0.5 mg via INTRAVENOUS
  Filled 2022-12-22: qty 0.5

## 2022-12-22 MED ORDER — SODIUM CHLORIDE 0.9 % IV SOLN: 250.0000 mL | INTRAVENOUS | Status: DC

## 2022-12-22 MED ORDER — IOHEXOL 350 MG/ML SOLN
75.0000 mL | Freq: Once | INTRAVENOUS | Status: AC | PRN
  Administered 2022-12-22: 75 mL via INTRAVENOUS

## 2022-12-22 MED ORDER — INSULIN DETEMIR 100 UNIT/ML ~~LOC~~ SOLN
2.0000 [IU] | Freq: Every day | SUBCUTANEOUS | Status: DC
  Filled 2022-12-22 (×3): qty 0.02

## 2022-12-22 MED ORDER — PHENYLEPHRINE HCL-NACL 20-0.9 MG/250ML-% IV SOLN
25.0000 ug/min | INTRAVENOUS | Status: DC
  Administered 2022-12-22: 75 ug/min via INTRAVENOUS
  Administered 2022-12-22: 25 ug/min via INTRAVENOUS
  Administered 2022-12-22: 75 ug/min via INTRAVENOUS
  Filled 2022-12-22 (×3): qty 250

## 2022-12-22 MED ORDER — OXYCODONE HCL 5 MG PO TABS
5.0000 mg | ORAL_TABLET | Freq: Four times a day (QID) | ORAL | Status: AC | PRN
  Administered 2022-12-22 – 2022-12-25 (×6): 5 mg via ORAL
  Filled 2022-12-22 (×6): qty 1

## 2022-12-22 MED ORDER — PHENYLEPHRINE CONCENTRATED 100MG/250ML (0.4 MG/ML) INFUSION SIMPLE
25.0000 ug/min | INTRAVENOUS | Status: DC
  Administered 2022-12-22: 90 ug/min via INTRAVENOUS
  Administered 2022-12-23: 30 ug/min via INTRAVENOUS
  Filled 2022-12-22 (×3): qty 250

## 2022-12-22 MED ORDER — SODIUM ZIRCONIUM CYCLOSILICATE 10 G PO PACK
10.0000 g | PACK | Freq: Once | ORAL | Status: AC
  Administered 2022-12-22: 10 g via ORAL
  Filled 2022-12-22: qty 1

## 2022-12-22 MED ORDER — PHENYLEPHRINE HCL-NACL 20-0.9 MG/250ML-% IV SOLN: 25.0000 ug/min | INTRAVENOUS | Status: DC

## 2022-12-22 NOTE — Progress Notes (Addendum)
Subjective: CC: Febrile to 102.2 overnight. Tachycardic this am. Soft BP on midodrine and back on Neo (uptrending needs, 35mcg/min currently). 2hrs HD last night. No abdominal pain assc with HD. She actually denies any abdominal pain, n/v. Drinking small amount of cld. BM yesterday that was non-bloody. Her lactic normalized at 3.1 > 1.1, WBC 8.4 > 15.4  Objective: Vital signs in last 24 hours: Temp:  [97.4 F (36.3 C)-102.2 F (39 C)] 102.2 F (39 C) (05/11 0723) Pulse Rate:  [76-114] 110 (05/11 0815) Resp:  [0-21] 18 (05/11 0815) BP: (46-136)/(19-106) 92/58 (05/11 0815) SpO2:  [94 %-100 %] 99 % (05/11 0815) Weight:  [40.7 kg-42.5 kg] 40.7 kg (05/11 0600) Last BM Date : 12/20/22  Intake/Output from previous day: 05/10 0701 - 05/11 0700 In: 1479.5 [P.O.:300; I.V.:492.7; IV Piggyback:686.8] Out: 0  Intake/Output this shift: No intake/output data recorded.  PE: Gen:  Alert, NAD, pleasant Abd: Soft, ND, only mild suprapubic ttp, otherwise completely NT. No rigidity or guarding, +BS.   Lab Results:  Recent Labs    12/20/22 0918 12/20/22 0946 12/22/22 0227  WBC 8.4  --  15.4*  HGB 12.9 15.0 10.0*  HCT 41.3 44.0 31.8*  PLT 250  --  232    BMET Recent Labs    12/20/22 1751 12/22/22 0227  NA 135 137  K 5.6* 5.6*  CL 97* 97*  CO2 19* 22  GLUCOSE 247* 50*  BUN 57* 82*  CREATININE 5.87* 7.86*  CALCIUM 8.5* 8.5*    PT/INR No results for input(s): "LABPROT", "INR" in the last 72 hours. CMP     Component Value Date/Time   NA 137 12/22/2022 0227   NA 133 (L) 05/18/2020 0927   K 5.6 (H) 12/22/2022 0227   CL 97 (L) 12/22/2022 0227   CO2 22 12/22/2022 0227   GLUCOSE 50 (L) 12/22/2022 0227   BUN 82 (H) 12/22/2022 0227   BUN 45 (H) 05/18/2020 0927   CREATININE 7.86 (H) 12/22/2022 0227   CALCIUM 8.5 (L) 12/22/2022 0227   CALCIUM 12.1 (H) 11/13/2021 2026   PROT 8.0 12/20/2022 0918   PROT 7.6 05/18/2020 0927   ALBUMIN 3.2 (L) 12/20/2022 0918   ALBUMIN 3.9  05/18/2020 0927   AST 19 12/20/2022 0918   ALT 20 12/20/2022 0918   ALKPHOS 138 (H) 12/20/2022 0918   BILITOT 0.5 12/20/2022 0918   BILITOT <0.2 05/18/2020 0927   GFRNONAA 6 (L) 12/22/2022 0227   GFRAA 6 (L) 05/18/2020 0927   Lipase     Component Value Date/Time   LIPASE 40 12/20/2022 0918    Studies/Results: DG CHEST PORT 1 VIEW  Result Date: 12/22/2022 CLINICAL DATA:  Evaluate central venous catheter placement. EXAM: PORTABLE CHEST 1 VIEW COMPARISON:  12/20/2022 FINDINGS: Interval placement of left IJ catheter. The catheter tip crosses the midline and terminates in the expected location of the right subclavian vein. No pneumothorax visualized. Heart size appears normal. No pleural effusion or edema. No airspace opacities identified. IMPRESSION: Interval placement of left IJ catheter. The catheter crosses the midline and the tip terminates in the expected location of the right subclavian vein, directed distally. Recommend repositioning. No pneumothorax after catheter placement. These results will be called to the ordering clinician or representative by the Radiologist Assistant, and communication documented in the PACS or [employer]. Electronically Signed   By: Signa Kell M.D.   On: 12/22/2022 06:39   CT ABDOMEN PELVIS W CONTRAST  Result Date: 12/20/2022 CLINICAL DATA:  Abdominal pain EXAM: CT ABDOMEN AND PELVIS WITH CONTRAST TECHNIQUE: Multidetector CT imaging of the abdomen and pelvis was performed using the standard protocol following bolus administration of intravenous contrast. RADIATION DOSE REDUCTION: This exam was performed according to the departmental dose-optimization program which includes automated exposure control, adjustment of the mA and/or kV according to patient size and/or use of iterative reconstruction technique. CONTRAST:  75mL OMNIPAQUE IOHEXOL 350 MG/ML SOLN COMPARISON:  CT abdomen and pelvis dated February 09, 2022 FINDINGS: Lower chest: Cardiomegaly. Linear  opacity of the right lung base at site of previously seen consolidation, consistent with scarring. Hepatobiliary: Portal venous gas. Biliary ductal dilation, unchanged when compared with the prior exam. Gallstones with no evidence of gallbladder wall thickening. Pancreas: Atrophic. No pancreatic ductal dilatation or surrounding inflammatory changes. Spleen: Normal in size without focal abnormality. Adrenals/Urinary Tract: Atrophic bilateral kidneys. No hydronephrosis. Bladder wall thickening. Stomach/Bowel: Wall thickening and pneumatosis of the transverse and proximal descending colon. Gas seen in the mesenteric veins adjacent to the transverse colon. No evidence of obstruction. Normal appendix. Vascular/Lymphatic: Severe aortic atherosclerosis, including severe atherosclerotic disease of the distal branch vessels of the SMA and celiac arteries. No enlarged abdominal or pelvic lymph nodes. Reproductive: Uterus and bilateral adnexa are unremarkable. Other: No abdominal wall hernia or abnormality. No abdominopelvic ascites. Musculoskeletal: No acute or significant osseous findings. IMPRESSION: 1. Wall thickening and pneumatosis of the transverse and proximal descending colon with associated mesenteric venous and portal venous gas, findings are highly concerning for bowel ischemia. 2. Severe aortic atherosclerosis (ICD10-I70.0), including severe atherosclerotic disease of the distal branch vessels of the SMA and celiac arteries. 3. Bladder wall thickening, findings can be seen in the setting of cystitis. Correlate with urinalysis. Critical Value/emergent results were called by telephone at the time of interpretation on 12/20/2022 at 11:34 am to [facility] PA , who verbally acknowledged these results. Electronically Signed   By: Allegra Lai M.D.   On: 12/20/2022 11:38   DG Chest Port 1 View  Result Date: 12/20/2022 CLINICAL DATA:  Abdominal pain EXAM: PORTABLE CHEST 1 VIEW COMPARISON:  CXR 07/23/22 FINDINGS:  No pleural effusion. No pneumothorax. No focal airspace opacity. No radiographically apparent displaced rib fractures. Visualized upper abdomen is unremarkable. Normal cardiac and mediastinal contours. IMPRESSION: No focal airspace opacity Electronically Signed   By: Lorenza Cambridge M.D.   On: 12/20/2022 10:15    Anti-infectives: Anti-infectives (From admission, onward)    Start     Dose/Rate Route Frequency Ordered Stop   12/21/22 0800  piperacillin-tazobactam (ZOSYN) IVPB 2.25 g        2.25 g 100 mL/hr over 30 Minutes Intravenous Every 8 hours 12/21/22 0708     12/20/22 2200  piperacillin-tazobactam (ZOSYN) IVPB 2.25 g  Status:  Discontinued       See Hyperspace for full Linked Orders Report.   2.25 g 100 mL/hr over 30 Minutes Intravenous Every 8 hours 12/20/22 1324 12/21/22 0708   12/20/22 1330  piperacillin-tazobactam (ZOSYN) IVPB 3.375 g       See Hyperspace for full Linked Orders Report.   3.375 g 100 mL/hr over 30 Minutes Intravenous  Once 12/20/22 1324 12/20/22 1409        Assessment/Plan Possible mesenteric ischemia  - CT 5/9 with pneumatosis of transverse and proximal descending colon with associated mesenteric and portal venous gas; 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 Patient is s/p dialysis 5/8, pain began after this.  - Was improving yesterday however today is febrile, back  on pressors with increasing pressor needs. Her lactic normalized. WBC up. Exam is without peritonitis as above. Discussed with attending, will CTA A/P without PO/IV contrast. Nephrology okay with IV contrast. RN and Radiology aware that we would like CTA w/ PO contrast as well. Cont abx. Keep NPO. Discussed with patient, pending results of CTA she may need exploratory surgery with bowel resection and ultimately ostomy creation. Primary updated at bedside.    FEN: NPO, IVF per primary  ID: [email or web address] VTE: okay for chemical prophylaxis   - below per TRH/CCM -  T2DM with  hyperglycemia - improved, off insulin gtt ESRD on HD Hx of STEMI BL AKA HTN Hx of CVA on asa and plavix per chart review  I reviewed nursing notes, hospitalist notes, last 24 h vitals and pain scores, last 48 h intake and output, last 24 h labs and trends, and last 24 h imaging results.   LOS: 2 days    Jacinto Halim , [facility] Surgery 12/22/2022, 9:22 AM Please see Amion for pager number during day hours 7:00am-4:30pm

## 2022-12-22 NOTE — Procedures (Signed)
Central Venous Catheter Insertion Procedure Note  [name]  [id number]  20-Jan-1981  Date:12/22/22  Time:6:49 AM   Provider Performing:Autrey Human R Kaydon Husby   Procedure: Insertion of Non-tunneled Central Venous [phone]) with US guidance ([id number])   Indication(s) Medication administration  Consent Risks of the procedure as well as the alternatives and risks of each were explained to the patient and/or caregiver.  Consent for the procedure was obtained and is signed in the bedside chart  Anesthesia Topical only with 1% lidocaine   Timeout Verified patient identification, verified procedure, site/side was marked, verified correct patient position, special equipment/implants available, medications/allergies/relevant history reviewed, required imaging and test results available.  Sterile Technique Maximal sterile technique including full sterile barrier drape, hand hygiene, sterile gown, sterile gloves, mask, hair covering, sterile ultrasound probe cover (if used).  Procedure Description Area of catheter insertion was cleaned with chlorhexidine and draped in sterile fashion.  With real-time ultrasound guidance a central venous catheter was placed into the left internal jugular vein. Nonpulsatile blood flow and easy flushing noted in all ports.  The catheter was sutured in place and sterile dressing applied.  Complications/Tolerance None; patient tolerated the procedure well. Chest X-ray is ordered to verify placement for internal jugular or subclavian cannulation.   Chest x-ray is not ordered for femoral cannulation.  EBL Minimal  Specimen(s) None   Tracey Gasman Erica Richwine PA-C

## 2022-12-22 NOTE — Progress Notes (Signed)
CT obtained due to new fever, new vasopressor requirement, and rising WBC. CT imaging reviewed. Concern for ischemia at the level of the splenic flexure given imaging findings coupled with clinical course. Case reviewed with IR and one of my senior partners. Patient is high surgical risk and currently also refusing surgery if indicated. She is able to verbalize risk of death and acceptance of this risk if surgery indicated and not accepted. Patient agreeable to me contacting her husband to notify him of her decision. Abdominal exam benign and LA normal, both of which are reassuring. I have advised the patient that currently, I believe monitoring is a reasonable approach, but that my recommendation may change based on her clinical course. Based on my conversation with her, she does seem to have decision-making capacity and if the indication for surgery should present itself and she continues to decline surgery, her decision should be respected.   Diamantina Monks, MD General and Trauma Surgery [facility] Surgery

## 2022-12-22 NOTE — Progress Notes (Signed)
On call nephrology called about the bp of patient 80/30. Per md. Patients need to be run d/t potassium  being 5.6 and cut the hours to 2 hours.

## 2022-12-22 NOTE — Progress Notes (Signed)
Received patient in bed to unit.  Alert and oriented.  Informed consent signed and in chart.   TX duration:2 hours per on call md.   Patient tolerated well.  Transported back to the room  Alert, without acute distress.  Hand-off given to patient's nurse.   Access used: avf Access issues: none  Total UF removed: 0 Medication(s) given: albumin 25% Post HD VS: see table below Post HD weight: 40.7kg   12/22/22 0545  Vitals  Temp 97.9 F (36.6 C)  Temp Source Oral  BP (!) 136/106  MAP (mmHg) 115  BP Location Left Wrist  BP Method Automatic  Patient Position (if appropriate) Lying  Pulse Rate (!) 112  Pulse Rate Source Monitor  ECG Heart Rate (!) 113  Resp 14  Oxygen Therapy  SpO2 100 %  O2 Device Room Air  During Treatment Monitoring  HD Safety Checks Performed Yes  Intra-Hemodialysis Comments Tolerated well  Post Treatment  Dialyzer Clearance Lightly streaked  Duration of HD Treatment -hour(s) 2 hour(s)  Hemodialysis Intake (mL) 300 mL  Liters Processed 48  Fluid Removed (mL) 0 mL  Tolerated HD Treatment No (Comment)  Post-Hemodialysis Comments goal not met pt hypotensive  AVG/AVF Arterial Site Held (minutes) 10 minutes  AVG/AVF Venous Site Held (minutes) 15 minutes  Fistula / Graft Right Upper arm Arteriovenous fistula  Placement Date/Time: 02/09/22 1200   Placed prior to admission: Yes  Orientation: Right  Access Location: Upper arm  Access Type: Arteriovenous fistula  Site Condition No complications  Fistula / Graft Assessment Present;Thrill;Bruit  Status Deaccessed;Flushed      Paralee C[facility]

## 2022-12-22 NOTE — Progress Notes (Signed)
[employer] in place. Vernona Rieger Gleason PA reviewed placement on Xray and determine it is appropriate to use for Pressors.   Beatrix Shipper

## 2022-12-22 NOTE — Progress Notes (Signed)
eLink Physician-Brief Progress Note Patient Name: Niko Aument DOB: 1981-03-16 MRN: [id number]   Date of Service  12/22/2022  HPI/Events of Note  BP 84/52 pre-dialysis, patient is already on Midodrine 10 mg po tid.  eICU Interventions  Peripheral Phenylephrine gtt ordered.         Thomasene Lot Earnestine Shipp 12/22/2022, 1:47 AM

## 2022-12-22 NOTE — Progress Notes (Addendum)
NAME:  [name], MRN:  [id number], DOB:  [birth year], LOS: 2 ADMISSION DATE:  12/20/2022, CONSULTATION DATE:  12/20/22 REFERRING MD:  EDP, CHIEF COMPLAINT:  Abd pain   History of Present Illness:  42 yo female presented to ER with abdominal pain for 1 day.  Had elevated blood pressure and blood sugar in ER.  CT abdomen showed signs of ischemic colitis and elevated lactic acid.  Surgery recommended conservative management.  PCCM consulted to arrange for ICU admission.  Pertinent  Medical History  DM type 2, CVA, b/l BKA, ESRD on HD, Anemia, GBS Miller Fischer variant, Bed bugs, CAD, PAD  Significant Hospital Events: Including procedures, antibiotic start and stop dates in addition to other pertinent events   5/09 admit 5/10 off pressors, transfer to floor med  5/11 persistent hypotension overnight resulting in need of central access initiation of vasopressor support.  Interim History / Subjective:  Flat affect this a.m. with limited responses to questions and request. Denies any acute pain CM  Objective   Blood pressure (!) 92/58, pulse (!) 110, temperature (!) 102.2 F (39 C), temperature source Oral, resp. rate 18, weight 40.7 kg, SpO2 99 %.        Intake/Output Summary (Last 24 hours) at 12/22/2022 4098 Last data filed at 12/22/2022 0545 Gross per 24 hour  Intake 1313.72 ml  Output 0 ml  Net 1313.72 ml    Filed Weights   12/21/22 0456 12/22/22 0300 12/22/22 0600  Weight: 39.3 kg 42.5 kg 40.7 kg    Examination: General: Acute on chronic ill-appearing middle-aged female lying in bed in no acute distress HEENT: [place]/AT, MM pink/moist, PERRL,  Neuro: Appears alert and oriented x 3 but refuses to answer orientation questions CV: s1s2 regular rate and rhythm, no murmur, rubs, or gallops,  PULM: Clear to auscultation bilaterally, no increased work of breathing, no added breath sounds GI: soft, bowel sounds hypoactive in all 4 quadrants, non-tender, non-distended Extremities:  warm/dry, bilateral BKA Skin: no rashes or lesions  Resolved Hospital Problem list    Assessment & Plan:   Possible mesenteric ischemia -CT with pneumatosis of transverse and proximal descending colon with associated mesenteric and portal venous gas, 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  Pain appears to correlate with HD sessions -Continues to deny any abdominal pain or bloody BM on a.m. assessment 5/11 she is now more hemodynamically unstable on vasopressors and febrile with Tmax 102.2 P: General surgery following, appreciate assistance Plan to obtain CT abdomen with and without contrast Continue vasopressor support as below Continue empiric Zosyn Ensure pain control Continue pressors for MAP goal greater than 65  ESRD Hyperkalemia Non gap metabolic acidosis P: Nephrology following, appreciate assistance Patient was unable to undergo HD overnight due to hypotension vasopressor support in place Follow renal function Trend Bmet Avoid nephrotoxins, ensure adequate renal perfusion  Lokelma now for hyperkalemia Continue midodrine  HTN, HLD, PAD. -Home medications include Norvasc, Lipitor, Imdur, Plavix Lopressor, P: Hold home antihypertensives Resume home statin Continuous telemetry  DM type 2 poorly controlled with hyperglycemia. -Hemoglobin A1c 8.2 12/20/2022 P: Continue SSI Add long-acting insulin per diabetes coordinator CBG checks every 4 hours  Elevated beta HCG. -I-STAT beta-hCG 17.4 on admission, repeat 5/11 remains elevated at 10 P: Will repeat in a.m.  Best Practice (right click and "Reselect all SmartList Selections" daily)   Diet/type: full liquids  DVT prophylaxis: prophylactic heparin  GI prophylaxis: N/A Lines: N/A Foley:  N/A Code Status:  full code Last date  of multidisciplinary goals of care discussion [N/A]  Critical care:   Performed by: Lenisha Lacap D. Harris  Total critical care time: 38 minutes  Critical care  time was exclusive of separately billable procedures and treating other patients.  Critical care was necessary to treat or prevent imminent or life-threatening deterioration.  Critical care was time spent personally by me on the following activities: development of treatment plan with patient and/or surrogate as well as nursing, discussions with consultants, evaluation of patient's response to treatment, examination of patient, obtaining history from patient or surrogate, ordering and performing treatments and interventions, ordering and review of laboratory studies, ordering and review of radiographic studies, pulse oximetry and re-evaluation of patient's condition.  Ezelle Surprenant D. Harris, NP-C [facility] Personal contact information can be found on Amion  If no contact or response made please call [phone] 12/22/2022, 9:41 AM

## 2022-12-22 NOTE — Progress Notes (Signed)
Pt was transported to CT with this RN. Pt tolerated well, no issues.

## 2022-12-22 NOTE — Progress Notes (Signed)
Patient SBP in the 80s. eLink contacted regarding BP and that HD is scheduled to start during this shift and has been given report. Instructed to start pressors through patient's one peripheral IV. Per IV team, unable to find another viable option, and current PIV too deep for an IV watch to be effective.Will continue to monitor PIV site for any signs of infiltration.   Beatrix Shipper

## 2022-12-22 NOTE — Progress Notes (Signed)
CBG 41 obtained. Pt asleep but arouses easily and interactive. 25 g Dextrose given as ordered. Recheck CBG 144. LR D5 currently infusing. Pt was encouraged to eat breakfast. Pt states " I don't wanna eat right now, I want to sleep." Pt was educated on importance of eating to maintain blood sugar.

## 2022-12-22 NOTE — Plan of Care (Signed)

## 2022-12-22 NOTE — Progress Notes (Signed)
[facility] Progress Note   Subjective:  Seen in room - asleep at first, but wakes easily. No CP or dyspnea, + abdominal pain. Got 2hr HD last night, no UF due to hypotension. On pressors + zosyn. Febrile this AM. K 5.6 again today despite HD yesterday - will order Lokelma 10g now.  Objective Vitals:   12/22/22 0730 12/22/22 0745 12/22/22 0800 12/22/22 0815  BP: 92/67 (!) 82/72 (!) 78/64 (!) 92/58  Pulse: (!) 114 (!) 114 (!) 111 (!) 110  Resp: 15 17 20 18   Temp:      TempSrc:      SpO2: 98% 98% 96% 99%  Weight:       Physical Exam General: Chronically ill woman, NAD. Room air. A&O x 3 Heart: RRR; 2/6 murmur Lungs: CTA anteriorly Abdomen: generalized tenderness Extremities: B AKA, no edema Dialysis Access: RUE AVF + bruit, blood on bandage - not actively bleeding  Additional Objective Labs: Basic Metabolic Panel: Recent Labs  Lab 12/20/22 0918 12/20/22 0946 12/20/22 1751 12/22/22 0227  NA 130* 130* 135 137  K 5.6* 5.6* 5.6* 5.6*  CL 89*  --  97* 97*  CO2 24  --  19* 22  GLUCOSE 774*  --  247* 50*  BUN 51*  --  57* 82*  CREATININE 5.91*  --  5.87* 7.86*  CALCIUM 8.6*  --  8.5* 8.5*   Liver Function Tests: Recent Labs  Lab 12/20/22 0918  AST 19  ALT 20  ALKPHOS 138*  BILITOT 0.5  PROT 8.0  ALBUMIN 3.2*   Recent Labs  Lab 12/20/22 0918  LIPASE 40   CBC: Recent Labs  Lab 12/20/22 0918 12/20/22 0946 12/22/22 0227  WBC 8.4  --  15.4*  NEUTROABS 7.6  --   --   HGB 12.9 15.0 10.0*  HCT 41.3 44.0 31.8*  MCV 81.1  --  81.1  PLT 250  --  232   Blood Culture    Component Value Date/Time   SDES BLOOD RIGHT ARM 06/28/2022 0625   SPECREQUEST  06/28/2022 0625    BOTTLES DRAWN AEROBIC AND ANAEROBIC Blood Culture adequate volume   CULT  06/28/2022 0625    NO GROWTH 5 DAYS Performed at [facility], [address] [place], [place] [address]    REPTSTATUS 07/03/2022 FINAL 06/28/2022 6045   Studies/Results: DG CHEST PORT 1  VIEW  Result Date: 12/22/2022 CLINICAL DATA:  Evaluate central venous catheter placement. EXAM: PORTABLE CHEST 1 VIEW COMPARISON:  12/20/2022 FINDINGS: Interval placement of left IJ catheter. The catheter tip crosses the midline and terminates in the expected location of the right subclavian vein. No pneumothorax visualized. Heart size appears normal. No pleural effusion or edema. No airspace opacities identified. IMPRESSION: Interval placement of left IJ catheter. The catheter crosses the midline and the tip terminates in the expected location of the right subclavian vein, directed distally. Recommend repositioning. No pneumothorax after catheter placement. These results will be called to the ordering clinician or representative by the Radiologist Assistant, and communication documented in the PACS or [employer]. Electronically Signed   By: Signa Kell M.D.   On: 12/22/2022 06:39   CT ABDOMEN PELVIS W CONTRAST  Result Date: 12/20/2022 CLINICAL DATA:  Abdominal pain EXAM: CT ABDOMEN AND PELVIS WITH CONTRAST TECHNIQUE: Multidetector CT imaging of the abdomen and pelvis was performed using the standard protocol following bolus administration of intravenous contrast. RADIATION DOSE REDUCTION: This exam was performed according to the departmental dose-optimization program  which includes automated exposure control, adjustment of the mA and/or kV according to patient size and/or use of iterative reconstruction technique. CONTRAST:  75mL OMNIPAQUE IOHEXOL 350 MG/ML SOLN COMPARISON:  CT abdomen and pelvis dated February 09, 2022 FINDINGS: Lower chest: Cardiomegaly. Linear opacity of the right lung base at site of previously seen consolidation, consistent with scarring. Hepatobiliary: Portal venous gas. Biliary ductal dilation, unchanged when compared with the prior exam. Gallstones with no evidence of gallbladder wall thickening. Pancreas: Atrophic. No pancreatic ductal dilatation or surrounding inflammatory  changes. Spleen: Normal in size without focal abnormality. Adrenals/Urinary Tract: Atrophic bilateral kidneys. No hydronephrosis. Bladder wall thickening. Stomach/Bowel: Wall thickening and pneumatosis of the transverse and proximal descending colon. Gas seen in the mesenteric veins adjacent to the transverse colon. No evidence of obstruction. Normal appendix. Vascular/Lymphatic: Severe aortic atherosclerosis, including severe atherosclerotic disease of the distal branch vessels of the SMA and celiac arteries. No enlarged abdominal or pelvic lymph nodes. Reproductive: Uterus and bilateral adnexa are unremarkable. Other: No abdominal wall hernia or abnormality. No abdominopelvic ascites. Musculoskeletal: No acute or significant osseous findings. IMPRESSION: 1. Wall thickening and pneumatosis of the transverse and proximal descending colon with associated mesenteric venous and portal venous gas, findings are highly concerning for bowel ischemia. 2. Severe aortic atherosclerosis (ICD10-I70.0), including severe atherosclerotic disease of the distal branch vessels of the SMA and celiac arteries. 3. Bladder wall thickening, findings can be seen in the setting of cystitis. Correlate with urinalysis. Critical Value/emergent results were called by telephone at the time of interpretation on 12/20/2022 at 11:34 am to [facility] PA , who verbally acknowledged these results. Electronically Signed   By: Allegra Lai M.D.   On: 12/20/2022 11:38   DG Chest Port 1 View  Result Date: 12/20/2022 CLINICAL DATA:  Abdominal pain EXAM: PORTABLE CHEST 1 VIEW COMPARISON:  CXR 07/23/22 FINDINGS: No pleural effusion. No pneumothorax. No focal airspace opacity. No radiographically apparent displaced rib fractures. Visualized upper abdomen is unremarkable. Normal cardiac and mediastinal contours. IMPRESSION: No focal airspace opacity Electronically Signed   By: Lorenza Cambridge M.D.   On: 12/20/2022 10:15    Medications:  sodium  chloride     sodium chloride     sodium chloride     dextrose 5% lactated ringers 20 mL/hr (12/22/22 0642)   phenylephrine (NEO-SYNEPHRINE) Adult infusion 75 mcg/min (12/22/22 0637)   piperacillin-tazobactam (ZOSYN)  IV 2.25 g (12/22/22 0758)    Chlorhexidine Gluconate Cloth  6 each Topical Daily   dorzolamide-timolol  1 drop Left Eye BID   feeding supplement (NEPRO CARB STEADY)  237 mL Oral BID BM   heparin injection (subcutaneous)  5,000 Units Subcutaneous Q8H   heparin sodium (porcine)       insulin aspart  0-5 Units Subcutaneous QHS   insulin aspart  0-6 Units Subcutaneous TID WC   midodrine  10 mg Oral Q8H   multivitamin  1 tablet Oral QHS    Dialysis Orders: MWF at [facility] - last HD 5/8 3:45hr, 400/A1.5, EDW 49.5kg, 2K/2Ca, AVF, UFP #2, no heparin - Mircera IV q 2 weeks - last 5/3 - Venofer 50mg  IV weekly   Assessment/Plan:  Bowel ischemia per CT 12/20/22: Blood in stool. Surgery following. Febrile and leukocytosis this AM.  HyPOtension: Midodrine 10mg  TID added, now on pressors too. No edema on exam.  Hyperglycemia: On admit, hypoglycemic this AM - per primary team.  ESRD:  Usual MWF schedule - s/p 2hr HD last night, no UF d/t hypotension. K  5.6 again this AM, ordering Lokelma now - trying to avoid additional HD if possible, but may need.  Anemia: Hgb dropped to 10 today - will give ESA today.  Metabolic bone disease: Ca ok, resume binder once allowed to eat full diet.  T2DM  Hx CVA  Hx B AKA  Ozzie Hoyle, PA-C 12/22/2022, 8:40 AM  [employer]

## 2022-12-23 DIAGNOSIS — K559 Vascular disorder of intestine, unspecified: Secondary | ICD-10-CM | POA: Diagnosis not present

## 2022-12-23 LAB — BASIC METABOLIC PANEL
Anion gap: 16 — ABNORMAL HIGH (ref 5–15)
BUN: 45 mg/dL — ABNORMAL HIGH (ref 6–20)
CO2: 24 mmol/L (ref 22–32)
Calcium: 8.7 mg/dL — ABNORMAL LOW (ref 8.9–10.3)
Chloride: 97 mmol/L — ABNORMAL LOW (ref 98–111)
Creatinine, Ser: 6.13 mg/dL — ABNORMAL HIGH (ref 0.44–1.00)
GFR, Estimated: 8 mL/min — ABNORMAL LOW (ref >60)
Glucose, Bld: 119 mg/dL — ABNORMAL HIGH (ref 70–99)
Potassium: 4.1 mmol/L (ref 3.5–5.1)
Sodium: 137 mmol/L (ref 135–145)

## 2022-12-23 LAB — GLUCOSE, CAPILLARY
Glucose-Capillary: 100 mg/dL — ABNORMAL HIGH (ref 70–99)
Glucose-Capillary: 106 mg/dL — ABNORMAL HIGH (ref 70–99)
Glucose-Capillary: 108 mg/dL — ABNORMAL HIGH (ref 70–99)
Glucose-Capillary: 114 mg/dL — ABNORMAL HIGH (ref 70–99)
Glucose-Capillary: 145 mg/dL — ABNORMAL HIGH (ref 70–99)
Glucose-Capillary: 78 mg/dL (ref 70–99)
Glucose-Capillary: 83 mg/dL (ref 70–99)

## 2022-12-23 LAB — CBC
HCT: 30.2 % — ABNORMAL LOW (ref 36.0–46.0)
Hemoglobin: 9.6 g/dL — ABNORMAL LOW (ref 12.0–15.0)
MCH: 24.9 pg — ABNORMAL LOW (ref 26.0–34.0)
MCHC: 31.8 g/dL (ref 30.0–36.0)
MCV: 78.2 fL — ABNORMAL LOW (ref 80.0–100.0)
Platelets: 232 10*3/uL (ref 150–400)
RBC: 3.86 MIL/uL — ABNORMAL LOW (ref 3.87–5.11)
RDW: 19.7 % — ABNORMAL HIGH (ref 11.5–15.5)
WBC: 21.1 10*3/uL — ABNORMAL HIGH (ref 4.0–10.5)
nRBC: 0 % (ref 0.0–0.2)

## 2022-12-23 LAB — CULTURE, BLOOD (ROUTINE X 2)

## 2022-12-23 LAB — HCG, QUANTITATIVE, PREGNANCY: hCG, Beta Chain, Quant, S: 7 m[IU]/mL — ABNORMAL HIGH (ref <5)

## 2022-12-23 LAB — LACTIC ACID, PLASMA
Lactic Acid, Venous: 1.6 mmol/L (ref 0.5–1.9)
Lactic Acid, Venous: 1.1 mmol/L (ref 0.5–1.9)

## 2022-12-23 NOTE — Inpatient Diabetes Management (Signed)
Inpatient Diabetes Program Recommendations  AACE/ADA: New Consensus Statement on Inpatient Glycemic Control (2015)  Target Ranges:  Prepandial:   less than 140 mg/dL      Peak postprandial:   less than 180 mg/dL (1-2 hours)      Critically ill patients:  140 - 180 mg/dL   Lab Results  Component Value Date   GLUCAP 100 (H) 12/23/2022   HGBA1C 8.2 (H) 12/20/2022    Latest Reference Range & Units 12/22/22 22:30 12/23/22 00:23 12/23/22 04:12 12/23/22 07:52  Glucose-Capillary 70 - 99 mg/dL 61 (L) 409 (H) 811 (H) 100 (H)   Diabetes history: DM 1  Outpatient Diabetes medications:  Novolog 0-10 units tid with meals Semglee 30 units daily Current orders for Inpatient glycemic control:  Novolog 0-6 units tid with meals and HS  Levemir 2 units daily  Inpatient Diabetes Program Recommendations:    Contacted by RN regarding patient's continued low blood sugars.  She has very low dose Levemir ordered this morning.  Recommended contacting MD regarding holding Levemir?  It is very odd that patient is not needing basal insulin and that blood sugars are still low?  May want to change Novolog correction to q 6 hours.  Possibly hold basal insulin until blood sugars>150 consistently. Will follow.   Thanks,  Beryl Meager, RN, BC-ADM Inpatient Diabetes Coordinator Pager [phone]  (8a-5p)

## 2022-12-23 NOTE — Progress Notes (Signed)
Patient rechecked this afternoon.   She is on the phone with her husband and reports she feels well, denies abdominal pain.  HR is 82. Normotensive, neo down to 30. No further fever today.  Lactate remains normal at 1.1. Abdomen soft, still mildly tender along left side but much less than this morning.   Will continue close observation. Keep NPO except sips for now, continue abx. I went over with the patient that she still may need surgery, and she understands this would be an open surgery with likely a colostomy. She understandably does not want this, but states that if she gets very ill and is unable to communicate, she WOULD want something done to make her better and WOULD be agreeable to surgery and colostomy, if it were a matter of life or death.

## 2022-12-23 NOTE — Progress Notes (Signed)
NAME:  [name], MRN:  [id number], DOB:  11-11-1980, LOS: 3 ADMISSION DATE:  12/20/2022, CONSULTATION DATE:  12/20/22 REFERRING MD:  EDP, CHIEF COMPLAINT:  Abd pain   History of Present Illness:  42 yo female presented to ER with abdominal pain for 1 day.  Had elevated blood pressure and blood sugar in ER.  CT abdomen showed signs of ischemic colitis and elevated lactic acid.  Surgery recommended conservative management.  PCCM consulted to arrange for ICU admission.  Pertinent  Medical History  DM type 2, CVA, b/l BKA, ESRD on HD, Anemia, GBS Miller Fischer variant, Bed bugs, CAD, PAD  Significant Hospital Events: Including procedures, antibiotic start and stop dates in addition to other pertinent events   5/09 admit 5/10 off pressors, transfer to floor med  5/11 persistent hypotension overnight resulting in need of central access initiation of vasopressor support. 5/12 repeat CTA completed yesterday afternoon given febrile state and pressor requirement.  Per surgical evaluation images consistent with acute concern of ischemia to the level of the splenic fissure, patient declined surgical interventions  Interim History / Subjective:  Lying in bed minimally interactive with staff, denies any acute complaints but refuses to engage in further questioning or assessment.  Repeatedly stating "I am hot"  Objective   Blood pressure (!) 112/91, pulse 96, temperature (!) 100.7 F (38.2 C), temperature source Oral, resp. rate 14, weight 40.7 kg, SpO2 95 %.        Intake/Output Summary (Last 24 hours) at 12/23/2022 0825 Last data filed at 12/23/2022 0800 Gross per 24 hour  Intake 2077.28 ml  Output --  Net 2077.28 ml    Filed Weights   12/21/22 0456 12/22/22 0300 12/22/22 0600  Weight: 39.3 kg 42.5 kg 40.7 kg    Examination: General: Acute on chronic ill-appearing adult female lying in bed in no acute distress HEENT: [place]/AT, MM pink/moist, PERRL,  Neuro: Alert and oriented, flat  affect CV: s1s2 regular rate and rhythm, no murmur, rubs, or gallops,  PULM: Clear to auscultation bilaterally, no increased work of breathing, no added breath sounds GI: soft, bowel sounds hyppoactive in all 4 quadrants, non-tender, non-distended, tolerating diet Extremities: warm/dry, no edema  Skin: no rashes or lesions  Resolved Hospital Problem list    Assessment & Plan:   Possible mesenteric ischemia -CT on admit with pneumatosis of transverse and proximal descending colon with associated mesenteric and portal venous gas, 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  Pain appears to correlate with HD sessions -5/12 repeat CTA completed yesterday afternoon given febrile state and increased pressor requirement.  Per surgical evaluation images consistent with concern of ischemia to the level of the splenic fissure, patient declined surgical interventions P: General surgery following, appreciate assistance Continue vasopressor support, wean as able MAP goal greater than 60, SBP goal greater than 110 Monitor pain control  ESRD Hyperkalemia Non gap metabolic acidosis P: Nephrology following, appreciate assistance Repeat IHD per nephrology Follow renal function Avoid nephrotoxins Trend Bement Continue midodrine Continue midodrine  HTN, HLD, PAD. -Home medications include Norvasc, Lipitor, Imdur, Plavix Lopressor, P: Hold home hypertensives remain on hold Continue home statin Continuous telemetry  Type 1 diabetes poorly controlled with hyperglycemia, currently hypoglycemic -Hemoglobin A1c 8.2 12/20/2022, on further chart review patient has been formally diagnosed type I at age 44 P: Continue SSI Blood sugar remains borderline low despite dextrose drip Low-dose long-acting insulin started 5/11 once type 1 diabetes confirmed however did not receive dose yesterday due to "hypoglycemia"  per nursing CBG checks every 4 hours Diabetes coordinator  following  Elevated beta HCG. -I-STAT beta-hCG 17.4 on admission, repeat 10 > 7 P: Repeat downtrending  Best Practice (right click and "Reselect all SmartList Selections" daily)   Diet/type: full liquids  DVT prophylaxis: prophylactic heparin  GI prophylaxis: N/A Lines: N/A Foley:  N/A Code Status:  full code Last date of multidisciplinary goals of care discussion [N/A]  Critical care:   Performed by: Morell Mears D. Harris  Total critical care time: 37 minutes  Critical care time was exclusive of separately billable procedures and treating other patients.  Critical care was necessary to treat or prevent imminent or life-threatening deterioration.  Critical care was time spent personally by me on the following activities: development of treatment plan with patient and/or surrogate as well as nursing, discussions with consultants, evaluation of patient's response to treatment, examination of patient, obtaining history from patient or surrogate, ordering and performing treatments and interventions, ordering and review of laboratory studies, ordering and review of radiographic studies, pulse oximetry and re-evaluation of patient's condition.  Henrry Feil D. Harris, NP-C [facility] Personal contact information can be found on [employer]  If no contact or response made please call [phone] 12/23/2022, 8:25 AM

## 2022-12-23 NOTE — Progress Notes (Signed)
[facility] Progress Note   Dialysis Orders: MWF at [facility] - last HD 5/8 3:45hr, 400/A1.5, EDW 49.5kg, 2K/2Ca, AVF, UFP #2, no heparin - Mircera IV q 2 weeks - last 5/3 - Venofer 50mg  IV weekly   Assessment/Plan:  Bowel ischemia per CT 12/20/22: Blood in stool. Surgery following. Febrile (100.7 just now) and leukocytosis this AM.  HyPOtension: Cont Midodrine 10mg  TID, now on Neo as well. No edema on exam.  Hyperglycemia: On admit, hypoglycemic this AM - per primary team.  ESRD:  Usual MWF schedule - s/p 2hr HD Fri evening, fortunately K better after Lokelma. Plan on HD Monday.  Anemia: Hgb 9.6 - transfuse as needed; she's not going to respond to ESA with all the ongoing inflammation.  Metabolic bone disease: Ca ok, resume binder once allowed to eat full diet.  T2DM  Hx CVA  Hx B AKA  Subjective:  Seen in room - easily arousable. No CP or dyspnea + abdominal pain especially with palpation (see below in PE). On Neo + zosyn. Not febrile overnight but temp just taken now was 100.7, K better but WBC now 21K.   Objective Vitals:   12/23/22 0400 12/23/22 0415 12/23/22 0430 12/23/22 0700  BP: (!) 92/48 101/69 (!) 101/47 105/65  Pulse: 82 81 81 94  Resp: 19 18 18 14   Temp: 98.8 F (37.1 C)     TempSrc: Oral     SpO2: 94% 94% 95% 94%  Weight:       Physical Exam General: Chronically ill woman, A&O x 3 Heart: RRR; 2/6 murmur Lungs: CTA anteriorly Abdomen: generalized tenderness but much worse in the LUQ Extremities: B AKA, no edema Dialysis Access: RUE AVF + bruit, blood on bandage - not actively bleeding  Additional Objective Labs: Basic Metabolic Panel: Recent Labs  Lab 12/20/22 1751 12/22/22 0227 12/23/22 0407  NA 135 137 137  K 5.6* 5.6* 4.1  CL 97* 97* 97*  CO2 19* 22 24  GLUCOSE 247* 50* 119*  BUN 57* 82* 45*  CREATININE 5.87* 7.86* 6.13*  CALCIUM 8.5* 8.5* 8.7*   Liver Function Tests: Recent Labs  Lab 12/20/22 0918  AST 19  ALT 20   ALKPHOS 138*  BILITOT 0.5  PROT 8.0  ALBUMIN 3.2*   Recent Labs  Lab 12/20/22 0918  LIPASE 40   CBC: Recent Labs  Lab 12/20/22 0918 12/20/22 0946 12/22/22 0227 12/23/22 0407  WBC 8.4  --  15.4* 21.1*  NEUTROABS 7.6  --   --   --   HGB 12.9 15.0 10.0* 9.6*  HCT 41.3 44.0 31.8* 30.2*  MCV 81.1  --  81.1 78.2*  PLT 250  --  232 232   Blood Culture    Component Value Date/Time   SDES BLOOD RIGHT ARM 06/28/2022 0625   SPECREQUEST  06/28/2022 0625    BOTTLES DRAWN AEROBIC AND ANAEROBIC Blood Culture adequate volume   CULT  06/28/2022 0625    NO GROWTH 5 DAYS Performed at [facility], [address], [place], [place] [address]    REPTSTATUS 07/03/2022 FINAL 06/28/2022 9629   Studies/Results: CT Angio Abd/Pel w/ and/or w/o  Result Date: 12/22/2022 CLINICAL DATA:  Mesenteric ischemia. EXAM: CTA ABDOMEN AND PELVIS WITHOUT AND WITH CONTRAST TECHNIQUE: Multidetector CT imaging of the abdomen and pelvis was performed using the standard protocol during bolus administration of intravenous contrast. Multiplanar reconstructed images and MIPs were obtained and reviewed to evaluate the vascular anatomy. RADIATION DOSE REDUCTION: This exam  was performed according to the departmental dose-optimization program which includes automated exposure control, adjustment of the mA and/or kV according to patient size and/or use of iterative reconstruction technique. CONTRAST:  75mL OMNIPAQUE IOHEXOL 350 MG/ML SOLN COMPARISON:  12/20/2022 FINDINGS: VASCULAR Aorta: Normal caliber aorta without aneurysm, dissection, vasculitis or significant stenosis. Moderate atherosclerotic calcification evident. Celiac: Patent without evidence of aneurysm, dissection, vasculitis or significant stenosis. SMA: Patent without evidence of aneurysm, dissection, vasculitis or significant stenosis. Replaced right hepatic artery evident. Renals: Both renal arteries are patent although there is likely a flow limiting  stenosis in the proximal left renal artery (axial 51/6 and sagittal 121/12). Dense calcific plaque noted at the ostium of the right renal artery. IMA: Patent without evidence of aneurysm, dissection, vasculitis or significant stenosis. Inflow: Patent without evidence of aneurysm, dissection, vasculitis or significant stenosis. Proximal Outflow: Bilateral common femoral and visualized portions of the superficial and profunda femoral arteries are patent without evidence of aneurysm, dissection, vasculitis or significant stenosis. Veins: No obvious venous abnormality within the limitations of this arterial phase study. Specifically, portal vein and superior mesenteric vein are patent without evidence for portal venous gas. Review of the MIP images confirms the above findings. NON-VASCULAR Lower chest: Compressive atelectasis noted both dependent lung bases. Hepatobiliary: No suspicious focal abnormality within the liver parenchyma. High attenuation material in the gallbladder likely vicarious excretion of contrast from prior study. No intrahepatic or extrahepatic biliary dilation. Pancreas: No focal mass lesion. No dilatation of the main duct. No intraparenchymal cyst. No peripancreatic edema. Spleen: No splenomegaly. No focal mass lesion. Adrenals/Urinary Tract: No adrenal nodule or mass. 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 Both kidneys appear atrophic. No evidence for hydroureter. The urinary bladder appears normal for the degree of distention. Stomach/Bowel: Wall thickening noted mid stomach (33/9) likely accentuated by underdistention. Duodenum is normally positioned as is the ligament of Treitz. No small bowel wall thickening. No small bowel dilatation. The terminal ileum is normal. The appendix is normal. The degree of colonic pneumatosis has decreased in the interval although there is a persistent pneumatosis in the transverse colon (axial 55/9  and well demonstrated on coronal 44/13) and splenic flexure with mild circumferential wall thickening. Oral contrast material has passed through to the level of the rectum. Lymphatic: There is no gastrohepatic or hepatoduodenal ligament lymphadenopathy. No retroperitoneal or mesenteric lymphadenopathy. Upper normal para-aortic lymph nodes are seen. No pelvic sidewall lymphadenopathy. Reproductive: Unremarkable. Other: No appreciable intraperitoneal free fluid. No evidence for intraperitoneal free gas. Musculoskeletal: No worrisome lytic or sclerotic osseous abnormality. IMPRESSION: 1. The degree of colonic pneumatosis has decreased in the interval although there is a persistent pneumatosis involving the transverse colon and splenic flexure. Oral contrast material has passed through to the level of the rectum. 2. Interval resolution of mesenteric venous gas and portal venous gas. 3. 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 4. Probable flow limiting stenosis in the proximal left renal artery. 5. Wall thickening mid stomach likely accentuated by underdistention. Gastritis or sequelae of ischemia not excluded. 6. 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 Both kidneys appear atrophic. 7. 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 Electronically Signed   By: Kennith Center M.D.   On: 12/22/2022 12:11   DG CHEST PORT 1 VIEW  Result Date: 12/22/2022 CLINICAL DATA:  Evaluate central venous catheter placement. EXAM: PORTABLE CHEST 1  VIEW COMPARISON:  12/20/2022 FINDINGS: Interval placement of left IJ catheter. The catheter tip crosses the midline and terminates in the expected location of the right subclavian vein. No pneumothorax visualized. Heart size appears normal. No pleural effusion or edema. No airspace opacities identified. IMPRESSION: Interval placement  of left IJ catheter. The catheter crosses the midline and the tip terminates in the expected location of the right subclavian vein, directed distally. Recommend repositioning. No pneumothorax after catheter placement. These results will be called to the ordering clinician or representative by the Radiologist Assistant, and communication documented in the PACS or [employer]. Electronically Signed   By: Signa Kell M.D.   On: 12/22/2022 06:39    Medications:  sodium chloride     dextrose 5% lactated ringers 50 mL/hr at 12/23/22 0558   phenylephrine (NEO-SYNEPHRINE) Adult infusion 60 mcg/min (12/23/22 0444)   piperacillin-tazobactam (ZOSYN)  IV 2.25 g (12/23/22 0754)    Chlorhexidine Gluconate Cloth  6 each Topical Daily   dorzolamide-timolol  1 drop Left Eye BID   feeding supplement (NEPRO CARB STEADY)  237 mL Oral BID BM   heparin injection (subcutaneous)  5,000 Units Subcutaneous Q8H   insulin aspart  0-5 Units Subcutaneous QHS   insulin aspart  0-6 Units Subcutaneous TID WC   insulin detemir  2 Units Subcutaneous Daily   midodrine  10 mg Oral Q8H   multivitamin  1 tablet Oral QHS

## 2022-12-23 NOTE — Progress Notes (Addendum)
Subjective: CC: Febrile to 102.2 overnight but tachycardia has improved.  This prompted CT, see Dr. Marvetta Gibbons note from yesterday evening-concern for ischemia at the level of the splenic flexure.  At that time her abdominal exam was benign..  She is on neo@60  which is decreased compared to last night. No hematochezia or bowel movements overnight per RN at bedside .  The patient denies abdominal pain.  Her lactate remains normal last night at 0.9, but her white count has gone up this morning from 15-21.  Objective: Vital signs in last 24 hours: Temp:  [98.3 F (36.8 C)-100.7 F (38.2 C)] 100.7 F (38.2 C) (05/12 0800) Pulse Rate:  [81-98] 92 (05/12 1015) Resp:  [10-22] 13 (05/12 1015) BP: (66-151)/(30-109) 130/45 (05/12 1015) SpO2:  [87 %-100 %] 95 % (05/12 1015) Last BM Date : 12/22/22 (per received RN report)  Intake/Output from previous day: 05/11 0701 - 05/12 0700 In: 1882.2 [I.V.:1732.2; IV Piggyback:150] Out: -  Intake/Output this shift: Total I/O In: 195.1 [I.V.:192.5; IV Piggyback:2.6] Out: -   PE: Gen:  Alert, NAD, pleasant Abd: Soft, ND, tender with guarding along the left hemiabdomen.  This is a change from yesterday.  Lab Results:  Recent Labs    12/22/22 0227 12/23/22 0407  WBC 15.4* 21.1*  HGB 10.0* 9.6*  HCT 31.8* 30.2*  PLT 232 232    BMET Recent Labs    12/22/22 0227 12/23/22 0407  NA 137 137  K 5.6* 4.1  CL 97* 97*  CO2 22 24  GLUCOSE 50* 119*  BUN 82* 45*  CREATININE 7.86* 6.13*  CALCIUM 8.5* 8.7*    PT/INR No results for input(s): "LABPROT", "INR" in the last 72 hours. CMP     Component Value Date/Time   NA 137 12/23/2022 0407   NA 133 (L) 05/18/2020 0927   K 4.1 12/23/2022 0407   CL 97 (L) 12/23/2022 0407   CO2 24 12/23/2022 0407   GLUCOSE 119 (H) 12/23/2022 0407   BUN 45 (H) 12/23/2022 0407   BUN 45 (H) 05/18/2020 0927   CREATININE 6.13 (H) 12/23/2022 0407   CALCIUM 8.7 (L) 12/23/2022 0407   CALCIUM 12.1 (H)  11/13/2021 2026   PROT 8.0 12/20/2022 0918   PROT 7.6 05/18/2020 0927   ALBUMIN 3.2 (L) 12/20/2022 0918   ALBUMIN 3.9 05/18/2020 0927   AST 19 12/20/2022 0918   ALT 20 12/20/2022 0918   ALKPHOS 138 (H) 12/20/2022 0918   BILITOT 0.5 12/20/2022 0918   BILITOT <0.2 05/18/2020 0927   GFRNONAA 8 (L) 12/23/2022 0407   GFRAA 6 (L) 05/18/2020 0927   Lipase     Component Value Date/Time   LIPASE 40 12/20/2022 0918    Studies/Results: CT Angio Abd/Pel w/ and/or w/o  Result Date: 12/22/2022 CLINICAL DATA:  Mesenteric ischemia. EXAM: CTA ABDOMEN AND PELVIS WITHOUT AND WITH CONTRAST TECHNIQUE: Multidetector CT imaging of the abdomen and pelvis was performed using the standard protocol during bolus administration of intravenous contrast. Multiplanar reconstructed images and MIPs were obtained and reviewed to evaluate the vascular anatomy. RADIATION DOSE REDUCTION: This exam was performed according to the departmental dose-optimization program which includes automated exposure control, adjustment of the mA and/or kV according to patient size and/or use of iterative reconstruction technique. CONTRAST:  75mL OMNIPAQUE IOHEXOL 350 MG/ML SOLN COMPARISON:  12/20/2022 FINDINGS: VASCULAR Aorta: Normal caliber aorta without aneurysm, dissection, vasculitis or significant stenosis. Moderate atherosclerotic calcification evident. Celiac: Patent without evidence of aneurysm, dissection, vasculitis or significant  stenosis. SMA: Patent without evidence of aneurysm, dissection, vasculitis or significant stenosis. Replaced right hepatic artery evident. Renals: Both renal arteries are patent although there is likely a flow limiting stenosis in the proximal left renal artery (axial 51/6 and sagittal 121/12). Dense calcific plaque noted at the ostium of the right renal artery. IMA: Patent without evidence of aneurysm, dissection, vasculitis or significant stenosis. Inflow: Patent without evidence of aneurysm, dissection,  vasculitis or significant stenosis. Proximal Outflow: Bilateral common femoral and visualized portions of the superficial and profunda femoral arteries are patent without evidence of aneurysm, dissection, vasculitis or significant stenosis. Veins: No obvious venous abnormality within the limitations of this arterial phase study. Specifically, portal vein and superior mesenteric vein are patent without evidence for portal venous gas. Review of the MIP images confirms the above findings. NON-VASCULAR Lower chest: Compressive atelectasis noted both dependent lung bases. Hepatobiliary: No suspicious focal abnormality within the liver parenchyma. High attenuation material in the gallbladder likely vicarious excretion of contrast from prior study. No intrahepatic or extrahepatic biliary dilation. Pancreas: No focal mass lesion. No dilatation of the main duct. No intraparenchymal cyst. No peripancreatic edema. Spleen: No splenomegaly. No focal mass lesion. Adrenals/Urinary Tract: No adrenal nodule or mass. 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 Both kidneys appear atrophic. No evidence for hydroureter. The urinary bladder appears normal for the degree of distention. Stomach/Bowel: Wall thickening noted mid stomach (33/9) likely accentuated by underdistention. Duodenum is normally positioned as is the ligament of Treitz. No small bowel wall thickening. No small bowel dilatation. The terminal ileum is normal. The appendix is normal. The degree of colonic pneumatosis has decreased in the interval although there is a persistent pneumatosis in the transverse colon (axial 55/9 and well demonstrated on coronal 44/13) and splenic flexure with mild circumferential wall thickening. Oral contrast material has passed through to the level of the rectum. Lymphatic: There is no gastrohepatic or hepatoduodenal ligament lymphadenopathy. No retroperitoneal or mesenteric  lymphadenopathy. Upper normal para-aortic lymph nodes are seen. No pelvic sidewall lymphadenopathy. Reproductive: Unremarkable. Other: No appreciable intraperitoneal free fluid. No evidence for intraperitoneal free gas. Musculoskeletal: No worrisome lytic or sclerotic osseous abnormality. IMPRESSION: 1. The degree of colonic pneumatosis has decreased in the interval although there is a persistent pneumatosis involving the transverse colon and splenic flexure. Oral contrast material has passed through to the level of the rectum. 2. Interval resolution of mesenteric venous gas and portal venous gas. 3. 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 4. Probable flow limiting stenosis in the proximal left renal artery. 5. Wall thickening mid stomach likely accentuated by underdistention. Gastritis or sequelae of ischemia not excluded. 6. 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 Both kidneys appear atrophic. 7. 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 Electronically Signed   By: Kennith Center M.D.   On: 12/22/2022 12:11   DG CHEST PORT 1 VIEW  Result Date: 12/22/2022 CLINICAL DATA:  Evaluate central venous catheter placement. EXAM: PORTABLE CHEST 1 VIEW COMPARISON:  12/20/2022 FINDINGS: Interval placement of left IJ catheter. The catheter tip crosses the midline and terminates in the expected location of the right subclavian vein. No pneumothorax visualized. Heart size appears normal. No pleural effusion or edema. No airspace opacities identified. IMPRESSION: Interval placement of left IJ catheter. The catheter crosses the midline and the tip terminates in the expected location of the right  subclavian vein, directed distally. Recommend repositioning. No pneumothorax after catheter placement. These results will be called to the ordering clinician or  representative by the Radiologist Assistant, and communication documented in the PACS or [employer]. Electronically Signed   By: Signa Kell M.D.   On: 12/22/2022 06:39    Anti-infectives: Anti-infectives (From admission, onward)    Start     Dose/Rate Route Frequency Ordered Stop   12/21/22 0800  piperacillin-tazobactam (ZOSYN) IVPB 2.25 g        2.25 g 100 mL/hr over 30 Minutes Intravenous Every 8 hours 12/21/22 0708     12/20/22 2200  piperacillin-tazobactam (ZOSYN) IVPB 2.25 g  Status:  Discontinued       See Hyperspace for full Linked Orders Report.   2.25 g 100 mL/hr over 30 Minutes Intravenous Every 8 hours 12/20/22 1324 12/21/22 0708   12/20/22 1330  piperacillin-tazobactam (ZOSYN) IVPB 3.375 g       See Hyperspace for full Linked Orders Report.   3.375 g 100 mL/hr over 30 Minutes Intravenous  Once 12/20/22 1324 12/20/22 1409        Assessment/Plan Possible mesenteric ischemia  - CT 5/9 with pneumatosis of transverse and proximal descending colon with associated mesenteric and portal venous gas; 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 Patient is s/p dialysis 5/8, pain began after this.  - Was improving last week however this weekend she is febrile, back on pressors with increasing pressor needs. Her lactic normalized. WBC up. Exam is more tender today than it was yesterday.  CT done the evening of 5/11 notes decreased level of colonic pneumatosis although persistent pneumatosis involving the transverse colon and splenic flexure, resolution of mesenteric venous gas, occlusion or substantial stenosis in the dominant mesenteric arterial anatomy.  I have reviewed these images personally, the vascular disease that I am able to appreciate is more peripheral.  Given increased tenderness this morning I will repeat a lactate and dc her soft diet- keep NPO except meds for now.  As documented previously by my partner, the patient was not amenable to surgery  at this time in addition to which she is exceptionally high risk of death both from this disease process itself as well as attempted surgical intervention. May benefit from palliative consult and definitive goals of care discussion.    FEN: NPO, IVF per primary  ID: [email or web address] VTE: okay for chemical prophylaxis   - below per TRH/CCM -  T2DM with hyperglycemia - improved, off insulin gtt ESRD on HD Hx of STEMI BL AKA HTN Hx of CVA on asa and plavix per chart review  I reviewed nursing notes, hospitalist notes, last 24 h vitals and pain scores, last 48 h intake and output, last 24 h labs and trends, and last 24 h imaging results.   LOS: 3 days    Berna Bue ,MD [facility] Surgery 12/23/2022, 10:39 AM Please see Amion for pager number during day hours 7:00am-4:30pm

## 2022-12-23 NOTE — Plan of Care (Signed)

## 2022-12-24 DIAGNOSIS — A419 Sepsis, unspecified organism: Secondary | ICD-10-CM

## 2022-12-24 DIAGNOSIS — R6521 Severe sepsis with septic shock: Secondary | ICD-10-CM | POA: Diagnosis not present

## 2022-12-24 DIAGNOSIS — K559 Vascular disorder of intestine, unspecified: Secondary | ICD-10-CM | POA: Diagnosis not present

## 2022-12-24 DIAGNOSIS — E440 Moderate protein-calorie malnutrition: Secondary | ICD-10-CM | POA: Diagnosis not present

## 2022-12-24 LAB — GLUCOSE, CAPILLARY
Glucose-Capillary: 103 mg/dL — ABNORMAL HIGH (ref 70–99)
Glucose-Capillary: 124 mg/dL — ABNORMAL HIGH (ref 70–99)
Glucose-Capillary: 125 mg/dL — ABNORMAL HIGH (ref 70–99)
Glucose-Capillary: 127 mg/dL — ABNORMAL HIGH (ref 70–99)
Glucose-Capillary: 199 mg/dL — ABNORMAL HIGH (ref 70–99)
Glucose-Capillary: 67 mg/dL — ABNORMAL LOW (ref 70–99)
Glucose-Capillary: 68 mg/dL — ABNORMAL LOW (ref 70–99)
Glucose-Capillary: 81 mg/dL (ref 70–99)

## 2022-12-24 LAB — CULTURE, BLOOD (ROUTINE X 2)
Culture: NO GROWTH
Culture: NO GROWTH

## 2022-12-24 LAB — BASIC METABOLIC PANEL
Anion gap: 16 — ABNORMAL HIGH (ref 5–15)
BUN: 58 mg/dL — ABNORMAL HIGH (ref 6–20)
CO2: 24 mmol/L (ref 22–32)
Calcium: 8.6 mg/dL — ABNORMAL LOW (ref 8.9–10.3)
Chloride: 98 mmol/L (ref 98–111)
Creatinine, Ser: 7.08 mg/dL — ABNORMAL HIGH (ref 0.44–1.00)
GFR, Estimated: 7 mL/min — ABNORMAL LOW (ref >60)
Glucose, Bld: 182 mg/dL — ABNORMAL HIGH (ref 70–99)
Potassium: 4.2 mmol/L (ref 3.5–5.1)
Sodium: 138 mmol/L (ref 135–145)

## 2022-12-24 LAB — CBC
HCT: 26.1 % — ABNORMAL LOW (ref 36.0–46.0)
Hemoglobin: 8.5 g/dL — ABNORMAL LOW (ref 12.0–15.0)
MCH: 25.1 pg — ABNORMAL LOW (ref 26.0–34.0)
MCHC: 32.6 g/dL (ref 30.0–36.0)
MCV: 77.2 fL — ABNORMAL LOW (ref 80.0–100.0)
Platelets: 239 10*3/uL (ref 150–400)
RBC: 3.38 MIL/uL — ABNORMAL LOW (ref 3.87–5.11)
RDW: 19.6 % — ABNORMAL HIGH (ref 11.5–15.5)
WBC: 18.9 10*3/uL — ABNORMAL HIGH (ref 4.0–10.5)
nRBC: 0 % (ref 0.0–0.2)

## 2022-12-24 MED ORDER — MIDODRINE HCL 5 MG PO TABS
15.0000 mg | ORAL_TABLET | Freq: Three times a day (TID) | ORAL | Status: DC
  Administered 2022-12-24 – 2022-12-27 (×8): 15 mg via ORAL
  Filled 2022-12-24 (×10): qty 3

## 2022-12-24 MED ORDER — SODIUM CHLORIDE 0.9 % IV SOLN: 250.0000 mL | INTRAVENOUS | Status: DC

## 2022-12-24 MED ORDER — ALBUMIN HUMAN 25 % IV SOLN
INTRAVENOUS | Status: AC
  Filled 2022-12-24: qty 100

## 2022-12-24 MED ORDER — NOREPINEPHRINE 4 MG/250ML-% IV SOLN
0.0000 ug/min | INTRAVENOUS | Status: DC
  Administered 2022-12-24: 3 ug/min via INTRAVENOUS
  Filled 2022-12-24: qty 250

## 2022-12-24 MED ORDER — NOREPINEPHRINE 4 MG/250ML-% IV SOLN
2.0000 ug/min | INTRAVENOUS | Status: DC
  Administered 2022-12-24: 2 ug/min via INTRAVENOUS
  Filled 2022-12-24: qty 250

## 2022-12-24 MED ORDER — PIPERACILLIN-TAZOBACTAM IN DEX 2-0.25 GM/50ML IV SOLN
2.2500 g | Freq: Three times a day (TID) | INTRAVENOUS | Status: AC
  Administered 2022-12-24 – 2022-12-26 (×6): 2.25 g via INTRAVENOUS
  Filled 2022-12-24 (×8): qty 50

## 2022-12-24 NOTE — Progress Notes (Signed)
Pt receives out-pt HD at [facility] on MWF. Pt admitted from snf per chart review. Will assist as needed.   Olivia Canter Renal Navigator [phone]

## 2022-12-24 NOTE — Progress Notes (Signed)
Chaplain responded to [facility] consult for prayer. Chaplain asked open ended questions to facilitate story telling and emotional expression. [name] shared that she has had a difficult couple of years and is currently living in a nursing home. She has been discouraged by needing ongoing dialysis and a new diagnosis of blood in her stool. She is hoping to get a new kidney so she can get off of dialysis and is eager for her prosthetic limbs to regain some independence. She reports she is the youngest of 34 children and despite being the youngest is often the one her siblings turn to for support. She attributes her strength and hope to her faith. She requested prayer, which I provided in alignment with her faith tradition. Chaplain reminded her that spiritual care is available 24/7 and encouraged her to request her team to call the chaplain if she would like a visit.  [name]. [name], M.Div. [facility] Chaplain Pager [phone] Office [phone]

## 2022-12-24 NOTE — Inpatient Diabetes Management (Signed)
Inpatient Diabetes Program Recommendations  AACE/ADA: New Consensus Statement on Inpatient Glycemic Control (2015)  Target Ranges:  Prepandial:   less than 140 mg/dL      Peak postprandial:   less than 180 mg/dL (1-2 hours)      Critically ill patients:  140 - 180 mg/dL   Lab Results  Component Value Date   GLUCAP 103 (H) 12/24/2022   HGBA1C 8.2 (H) 12/20/2022    Review of Glycemic Control  Latest Reference Range & Units 12/23/22 23:34 12/24/22 03:49 12/24/22 07:23  Glucose-Capillary 70 - 99 mg/dL 78 68 (L) 914 (H)  (L): Data is abnormally low (H): Data is abnormally high Diabetes history: DM 1  Outpatient Diabetes medications:  Novolog 0-10 units tid with meals Semglee 30 units daily Current orders for Inpatient glycemic control:  Novolog 0-6 units tid with meals and HS    Inpatient Diabetes Program Recommendations:     Consider changing correction to Novolog 0-6 units Q6H since patient NPO. Would continue to hold basal insulin until blood sugars>150 consistently. Will follow.   Thanks, Lujean Rave, MSN, RNC-OB Diabetes Coordinator [phone] (8a-5p)

## 2022-12-24 NOTE — Progress Notes (Signed)
  [facility] Progress Note   Subjective:  Seen in room, feeling better overall   Objective Vitals:   12/24/22 1315 12/24/22 1330 12/24/22 1345 12/24/22 1400  BP: (!) 145/56 (!) 149/22 (!) 151/33 (!) 139/43  Pulse: (!) 111 (!) 110 (!) 109 (!) 106  Resp: (!) 21 (!) 25 (!) 23 11  Temp:      TempSrc:      SpO2: 91% 92% 91% 90%  Weight:       Physical Exam General: Chronically ill woman, A&O x 3 Heart: RRR; 2/6 murmur Lungs: CTA anteriorly Abdomen: mild abd tenderness, no rebound Extremities: B AKA, no edema Dialysis Access: RUE AVF + bruit  OP HD: [facility] 3h 45 min    49.5kg  2K/2Ca bath   AVF    P#2   Heparin none - last HD 5/8 - Mircera IV q 2 weeks - last 5/3 - Venofer 50mg  IV weekly   Assessment/Plan:  Bowel ischemia per CT 12/20/22: Blood in stool. Surgery following. Fevers and WBC appear to be improving. Pt states she is feeling better.   HyPOtension-  Cont Midodrine 10mg  TID. Remains on levo gtt at 8 ug/min today. No edema on exam.  Hyperglycemia - per primary team.  ESRD - usual HD is MWF.  S/p 2hr HD Fri evening. Next HD today.  Anemia: Hgb 9.6 - transfuse as needed; she's not going to respond to ESA with all the ongoing inflammation.  Metabolic bone disease: Ca ok, resume binder once allowed to eat full diet.  T2DM  H/o CVA  H/o bilat AKA   Tracey Moselle, MD 12/24/2022, 3:23 PM  Recent Labs  Lab 12/20/22 0918 12/20/22 0946 12/23/22 0407 12/24/22 0400  HGB 12.9   < > 9.6* 8.5*  ALBUMIN 3.2*  --   --   --   CALCIUM 8.6*   < > 8.7* 8.6*  CREATININE 5.91*   < > 6.13* 7.08*  K 5.6*   < > 4.1 4.2   < > = values in this interval not displayed.    Inpatient medications:  Chlorhexidine Gluconate Cloth  6 each Topical Daily   dorzolamide-timolol  1 drop Left Eye BID   heparin injection (subcutaneous)  5,000 Units Subcutaneous Q8H   insulin aspart  0-5 Units Subcutaneous QHS   insulin aspart  0-6 Units Subcutaneous TID WC   midodrine  15  mg Oral Q8H   multivitamin  1 tablet Oral QHS    sodium chloride     sodium chloride     dextrose 5% lactated ringers 30 mL/hr at 12/24/22 1400   norepinephrine (LEVOPHED) Adult infusion 8 mcg/min (12/24/22 1400)   piperacillin-tazobactam (ZOSYN)  IV Stopped (12/24/22 0900)   acetaminophen, dextrose, docusate sodium, labetalol, oxyCODONE, polyethylene glycol

## 2022-12-24 NOTE — Progress Notes (Signed)
   12/24/22 1945  Vitals  Temp 98.4 F (36.9 C)  Pulse Rate 97  Resp 17  BP 118/66  SpO2 99 %  O2 Device Room Air  Weight 43.1 kg  Type of Weight Post-Dialysis  Oxygen Therapy  Patient Activity (if Appropriate) In bed  Pulse Oximetry Type Continuous  Oximetry Probe Site Changed No  Post Treatment  Dialyzer Clearance Lightly streaked  Duration of HD Treatment -hour(s) 3.75 hour(s)  Hemodialysis Intake (mL) 0 mL  Liters Processed 90  Fluid Removed (mL) 2000 mL  Tolerated HD Treatment Yes   Received patient in bed to unit.  Alert and oriented.  Informed consent signed and in chart.   TX duration:3.75  Patient tolerated well.  Transported back to the room  Alert, without acute distress.  Hand-off given to patient's nurse.   Access used: RUAF Access issues: entire arm edematous prior to stat of tx--dx is aware  Total UF removed: 2000 Medication(s) given: none   [facility]

## 2022-12-24 NOTE — TOC Initial Note (Signed)
Transition of Care [facility]) - Progression Note    Patient Details  Name: [name] MRN: [id number] Date of Birth: 1981/01/12  Transition of [facility]) CM/SW Contact  Inis Sizer, LCSW Phone Number: 12/24/2022, 10:37 AM  Clinical Narrative:    Patient is from LTC at [facility] - CSW spoke with Whitney at central intake to confirm this. Patient is eligible for return to [facility] once medically stable for discharge.      Expected Discharge Plan and Services                                               Social Determinants of Health (SDOH) Interventions SDOH Screenings   Food Insecurity: No Food Insecurity (12/20/2022)  Housing: Low Risk  (12/20/2022)  Transportation Needs: No Transportation Needs (12/20/2022)  Utilities: Not At Risk (12/20/2022)  Depression (PHQ2-9): Low Risk  (05/11/2020)  Tobacco Use: Low Risk  (12/20/2022)    Readmission Risk Interventions    07/04/2022   10:49 AM 08/23/2021    3:42 PM  Readmission Risk Prevention Plan  Transportation Screening Complete Complete  HRI or Home Care Consult  Complete  Social Work Consult for Recovery Care Planning/Counseling  Complete  Palliative Care Screening  Not Applicable  Medication Review Oceanographer) Complete Complete  PCP or Specialist appointment within 3-5 days of discharge Complete   HRI or Home Care Consult Complete   SW Recovery Care/Counseling Consult Complete   Palliative Care Screening Not Applicable   Skilled Nursing Facility Complete

## 2022-12-24 NOTE — Progress Notes (Addendum)
Subjective: CC: Tmax 100.7 at 0800 on 5/12. Afebrile over the last 24 hours. No tachycardia. Transitioned from Phenylephrine (Neo) to Levo (currently at 50mcg/min with increasing needs since transitioning off Neo). Midodrine increased last night from 10mg  q 8hrs to 15mg  q8hrs (has received one dose at increased dosage). WBC down from 21.1 > 18.9. Last lactic 1.1 (5/12 at 1144). RN reports BM this am that was blood streaked. Patient reports the pain on the left side of her abdomen she had when Dr. Fredricka Bonine saw her yesterday has since resolved. No abdominal pain since last night. Denies n/v. Reports she is hungry. Passing flatus. As noted above, bm today.   Objective: Vital signs in last 24 hours: Temp:  [98.2 F (36.8 C)-99.3 F (37.4 C)] 98.6 F (37 C) (05/13 0724) Pulse Rate:  [77-88] 80 (05/13 0715) Resp:  [11-23] 18 (05/13 0715) BP: (80-126)/(32-105) 105/73 (05/13 0715) SpO2:  [90 %-100 %] 96 % (05/13 0715) Weight:  [45.1 kg] 45.1 kg (05/13 0700) Last BM Date : 12/24/22  Intake/Output from previous day: 05/12 0701 - 05/13 0700 In: 1563.7 [I.V.:1413.7; IV Piggyback:150] Out: -  Intake/Output this shift: No intake/output data recorded.  PE: Gen:  Alert, NAD, pleasant Abd: Soft, ND, ttp of the suprapubic and left hemiabdomen that she reports is mild and better from yesterday. No rigidity or guarding. +BS  Lab Results:  Recent Labs    12/23/22 0407 12/24/22 0400  WBC 21.1* 18.9*  HGB 9.6* 8.5*  HCT 30.2* 26.1*  PLT 232 239    BMET Recent Labs    12/23/22 0407 12/24/22 0400  NA 137 138  K 4.1 4.2  CL 97* 98  CO2 24 24  GLUCOSE 119* 182*  BUN 45* 58*  CREATININE 6.13* 7.08*  CALCIUM 8.7* 8.6*    PT/INR No results for input(s): "LABPROT", "INR" in the last 72 hours. CMP     Component Value Date/Time   NA 138 12/24/2022 0400   NA 133 (L) 05/18/2020 0927   K 4.2 12/24/2022 0400   CL 98 12/24/2022 0400   CO2 24 12/24/2022 0400   GLUCOSE 182 (H)  12/24/2022 0400   BUN 58 (H) 12/24/2022 0400   BUN 45 (H) 05/18/2020 0927   CREATININE 7.08 (H) 12/24/2022 0400   CALCIUM 8.6 (L) 12/24/2022 0400   CALCIUM 12.1 (H) 11/13/2021 2026   PROT 8.0 12/20/2022 0918   PROT 7.6 05/18/2020 0927   ALBUMIN 3.2 (L) 12/20/2022 0918   ALBUMIN 3.9 05/18/2020 0927   AST 19 12/20/2022 0918   ALT 20 12/20/2022 0918   ALKPHOS 138 (H) 12/20/2022 0918   BILITOT 0.5 12/20/2022 0918   BILITOT <0.2 05/18/2020 0927   GFRNONAA 7 (L) 12/24/2022 0400   GFRAA 6 (L) 05/18/2020 0927   Lipase     Component Value Date/Time   LIPASE 40 12/20/2022 0918    Studies/Results: CT Angio Abd/Pel w/ and/or w/o  Result Date: 12/22/2022 CLINICAL DATA:  Mesenteric ischemia. EXAM: CTA ABDOMEN AND PELVIS WITHOUT AND WITH CONTRAST TECHNIQUE: Multidetector CT imaging of the abdomen and pelvis was performed using the standard protocol during bolus administration of intravenous contrast. Multiplanar reconstructed images and MIPs were obtained and reviewed to evaluate the vascular anatomy. RADIATION DOSE REDUCTION: This exam was performed according to the departmental dose-optimization program which includes automated exposure control, adjustment of the mA and/or kV according to patient size and/or use of iterative reconstruction technique. CONTRAST:  75mL OMNIPAQUE IOHEXOL 350 MG/ML SOLN COMPARISON:  12/20/2022 FINDINGS: VASCULAR Aorta: Normal caliber aorta without aneurysm, dissection, vasculitis or significant stenosis. Moderate atherosclerotic calcification evident. Celiac: Patent without evidence of aneurysm, dissection, vasculitis or significant stenosis. SMA: Patent without evidence of aneurysm, dissection, vasculitis or significant stenosis. Replaced right hepatic artery evident. Renals: Both renal arteries are patent although there is likely a flow limiting stenosis in the proximal left renal artery (axial 51/6 and sagittal 121/12). Dense calcific plaque noted at the ostium of the  right renal artery. IMA: Patent without evidence of aneurysm, dissection, vasculitis or significant stenosis. Inflow: Patent without evidence of aneurysm, dissection, vasculitis or significant stenosis. Proximal Outflow: Bilateral common femoral and visualized portions of the superficial and profunda femoral arteries are patent without evidence of aneurysm, dissection, vasculitis or significant stenosis. Veins: No obvious venous abnormality within the limitations of this arterial phase study. Specifically, portal vein and superior mesenteric vein are patent without evidence for portal venous gas. Review of the MIP images confirms the above findings. NON-VASCULAR Lower chest: Compressive atelectasis noted both dependent lung bases. Hepatobiliary: No suspicious focal abnormality within the liver parenchyma. High attenuation material in the gallbladder likely vicarious excretion of contrast from prior study. No intrahepatic or extrahepatic biliary dilation. Pancreas: No focal mass lesion. No dilatation of the main duct. No intraparenchymal cyst. No peripancreatic edema. Spleen: No splenomegaly. No focal mass lesion. Adrenals/Urinary Tract: No adrenal nodule or mass. 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 Both kidneys appear atrophic. No evidence for hydroureter. The urinary bladder appears normal for the degree of distention. Stomach/Bowel: Wall thickening noted mid stomach (33/9) likely accentuated by underdistention. Duodenum is normally positioned as is the ligament of Treitz. No small bowel wall thickening. No small bowel dilatation. The terminal ileum is normal. The appendix is normal. The degree of colonic pneumatosis has decreased in the interval although there is a persistent pneumatosis in the transverse colon (axial 55/9 and well demonstrated on coronal 44/13) and splenic flexure with mild circumferential wall thickening. Oral contrast material  has passed through to the level of the rectum. Lymphatic: There is no gastrohepatic or hepatoduodenal ligament lymphadenopathy. No retroperitoneal or mesenteric lymphadenopathy. Upper normal para-aortic lymph nodes are seen. No pelvic sidewall lymphadenopathy. Reproductive: Unremarkable. Other: No appreciable intraperitoneal free fluid. No evidence for intraperitoneal free gas. Musculoskeletal: No worrisome lytic or sclerotic osseous abnormality. IMPRESSION: 1. The degree of colonic pneumatosis has decreased in the interval although there is a persistent pneumatosis involving the transverse colon and splenic flexure. Oral contrast material has passed through to the level of the rectum. 2. Interval resolution of mesenteric venous gas and portal venous gas. 3. 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 4. Probable flow limiting stenosis in the proximal left renal artery. 5. Wall thickening mid stomach likely accentuated by underdistention. Gastritis or sequelae of ischemia not excluded. 6. 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 Both kidneys appear atrophic. 7. 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 Electronically Signed   By: Kennith Center M.D.   On: 12/22/2022 12:11    Anti-infectives: Anti-infectives (From admission, onward)    Start     Dose/Rate Route Frequency Ordered Stop   12/21/22 0800  piperacillin-tazobactam (ZOSYN) IVPB 2.25 g        2.25 g 100 mL/hr over 30 Minutes Intravenous Every 8 hours 12/21/22 0708     12/20/22 2200  piperacillin-tazobactam (ZOSYN) IVPB 2.25  g  Status:  Discontinued       See Hyperspace for full Linked Orders Report.   2.25 g 100 mL/hr over 30 Minutes Intravenous Every 8 hours 12/20/22 1324 12/21/22 0708   12/20/22 1330  piperacillin-tazobactam (ZOSYN) IVPB 3.375 g       See Hyperspace for full Linked  Orders Report.   3.375 g 100 mL/hr over 30 Minutes Intravenous  Once 12/20/22 1324 12/20/22 1409        Assessment/Plan Possible mesenteric ischemia  - CT 5/9 with pneumatosis of transverse and proximal descending colon with associated mesenteric and portal venous gas; 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 Patient is s/p dialysis 5/8, pain began after this.  - Was improving last week however this weekend she was febrile, back on pressors with increasing pressor needs. Lactic normalized but WBC was up. CTA done the evening of 5/11 notes decreased level of colonic pneumatosis although persistent pneumatosis involving the transverse colon and splenic flexure, resolution of mesenteric venous gas, occlusion or substantial stenosis in the dominant mesenteric arterial anatomy.  Dr. Fredricka Bonine reviewed these images and felt the vascular disease is more peripheral. - Patient was more ttp yesterday and made npo. Lactic wnl yesterday. In the evening her pain had improved/she was less ttp.  - Today her abdominal pain has improved. She is mildly ttp as above. No peritonitis. WBC downtrending. She has been afebrile over the last 24 hours but appears to have increased pressor need this morning after being switched from Neo to Levo (Midodrine has also been increased). Blood streaked stool this am. With more reassuring exam today, I believe monitoring is a reasonable approach, but that recommendation may change based on her clinical course. Would keep her strict npo for now. I still do not think she is out of the clear yet. We discussed that she was to fail to improve or worsen she may need exploratory surgery with bowel resection and ultimately ostomy creation. This has been discussed multiple times with the patient in the past. She previously refusing surgery if indicated (see Dr. Marvetta Gibbons note on 5/11) but was agreeable on 5/12 when Dr. Fredricka Bonine saw her. This morning she is unsure again. She is  exceptionally high risk from this disease process itself as well as an attempted surgical intervention (see risk calculator below). May benefit from palliative consult and definitive goals of care discussion - I am happy to be there to assist with these conversations in any way. Will discuss above recommendations with primary team.    FEN: NPO, IVF per primary  ID: Zosyn VTE: okay for chemical prophylaxis   - below per TRH/CCM -  T2DM with hyperglycemia - improved, off insulin gtt ESRD on HD Hx of STEMI BL AKA HTN Hx of CVA on asa and plavix per chart review  ACS RISK CALCULATOR USE   I reviewed nursing notes, hospitalist notes, last 24 h vitals and pain scores, last 48 h intake and output, last 24 h labs and trends, and last 24 h imaging results.   LOS: 4 days    Jacinto Halim ,MD [facility] 12/24/2022, 10:18 AM Please see Amion for pager number during day hours 7:00am-4:30pm

## 2022-12-24 NOTE — Progress Notes (Addendum)
NAME:  [name], MRN:  [id number], DOB:  12-15-1980, LOS: 4 ADMISSION DATE:  12/20/2022, CONSULTATION DATE:  12/20/22 REFERRING MD:  EDP, CHIEF COMPLAINT:  Abd pain   History of Present Illness:  42 yo female presented to ER with abdominal pain for 1 day.  Had elevated blood pressure and blood sugar in ER.  CT abdomen showed signs of ischemic colitis and elevated lactic acid.  Surgery recommended conservative management.  PCCM consulted to arrange for ICU admission.  Pertinent  Medical History  DM type 2, CVA, b/l BKA, ESRD on HD, Anemia, GBS Miller Fischer variant, Bed bugs, CAD, PAD  Significant Hospital Events: Including procedures, antibiotic start and stop dates in addition to other pertinent events   5/09 admit 5/10 off pressors, transfer to floor med  5/11 persistent hypotension overnight resulting in need of central access initiation of vasopressor support. 5/12 repeat CTA completed yesterday afternoon given febrile state and pressor requirement.  Per surgical evaluation images consistent with acute concern of ischemia to the level of the splenic fissure, patient declined surgical interventions  Interim History / Subjective:  Continues to vasopressin with fentanyl being at 35 mics Stated abdominal pain is better Denies nausea and vomiting  Objective   Blood pressure 105/73, pulse 80, temperature 98.6 F (37 C), temperature source Oral, resp. rate 18, weight 45.1 kg, SpO2 96 %.        Intake/Output Summary (Last 24 hours) at 12/24/2022 0914 Last data filed at 12/24/2022 0700 Gross per 24 hour  Intake 1368.6 ml  Output --  Net 1368.6 ml   Filed Weights   12/22/22 0300 12/22/22 0600 12/24/22 0700  Weight: 42.5 kg 40.7 kg 45.1 kg    Examination: Physical exam: General: Middle-age female, lying on the bed HEENT: /AT, eyes anicteric.  moist mucus membranes Neuro: Sleepy, opens eyes with vocal stimuli, following simple commands Chest: Coarse breath sounds, no wheezes  or rhonchi Heart: Regular rate and rhythm, no murmurs or gallops Abdomen: Soft, tender on left upper and lower quadrant, nondistended, bowel sounds present Skin: No rash  Labs and images were reviewed  Resolved Hospital Problem list   Hyperkalemia Anion gap metabolic acidosis Assessment & Plan:  Probable acute mesenteric ischemia Septic shock in the setting of probable mesenteric ischemia CT on admission showed pneumatosis of transverse and proximal descending colon with associated mesenteric and portal venous gas, 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  Pain appears to correlate with HD sessions Repeat CTA on 5/11 showing decreased: Stenosis although persistent pneumatosis of the transverse and splenic flexure.  Resolution of mesenteric and portal venous gas, occlusion/substantial stenosis in the dominant mesenteric arterial anatomy Patient stated abdominal pain is better White count is trending down General surgery following, appreciate assistance Switch phenylephrine to Levophed with map goal 65 Continue IV antibiotics with Zosyn White count is trending down She remained afebrile  ESRD Nephrology following, appreciate assistance Trend Bement Increase midodrine to 15 mg 3 times daily  HTN, HLD, PAD. Hold Home medications include Norvasc,  Imdur, Plavix Lopressor, Continue statin  Type 1 diabetes poorly controlled with hyperglycemia, currently hypoglycemic -Hemoglobin A1c 8.2 12/20/2022, on further chart review patient has been formally diagnosed type I at age 82 Continue SSI with CBG goal 140-180 Discontinue dextrose and long-acting insulin CBG checks every 4 hours Diabetes coordinator following  Anemia of chronic disease Continue to monitor H&H and transfuse if less than 7  Moderate Malnutrition Dietician is following   Best Practice (right click and "  Reselect all SmartList Selections" daily)   Diet/type: NPO except sips and chips DVT  prophylaxis: prophylactic heparin  GI prophylaxis: N/A Lines: N/A Foley:  N/A Code Status:  full code Last date of multidisciplinary goals of care discussion [5/13: Patient was updated at bedside, decided to continue full scope of care]    This patient is critically ill with multiple organ system failure which requires frequent high complexity decision making, assessment, support, evaluation, and titration of therapies. This was completed through the application of advanced monitoring technologies and extensive interpretation of multiple databases.  During this encounter critical care time was devoted to patient care services described in this note for 36 minutes.    Cheri Fowler, MD [facility] See Amion for pager If no response to pager, please call [phone] until 7pm After 7pm, Please call E-link [phone]

## 2022-12-25 DIAGNOSIS — Z515 Encounter for palliative care: Secondary | ICD-10-CM

## 2022-12-25 DIAGNOSIS — K559 Vascular disorder of intestine, unspecified: Secondary | ICD-10-CM | POA: Diagnosis not present

## 2022-12-25 DIAGNOSIS — E440 Moderate protein-calorie malnutrition: Secondary | ICD-10-CM | POA: Diagnosis not present

## 2022-12-25 DIAGNOSIS — N186 End stage renal disease: Secondary | ICD-10-CM | POA: Diagnosis not present

## 2022-12-25 DIAGNOSIS — R5382 Chronic fatigue, unspecified: Secondary | ICD-10-CM

## 2022-12-25 DIAGNOSIS — Z992 Dependence on renal dialysis: Secondary | ICD-10-CM

## 2022-12-25 DIAGNOSIS — Z7189 Other specified counseling: Secondary | ICD-10-CM

## 2022-12-25 LAB — CBC
HCT: 27 % — ABNORMAL LOW (ref 36.0–46.0)
Hemoglobin: 8.8 g/dL — ABNORMAL LOW (ref 12.0–15.0)
MCH: 25.2 pg — ABNORMAL LOW (ref 26.0–34.0)
MCHC: 32.6 g/dL (ref 30.0–36.0)
MCV: 77.4 fL — ABNORMAL LOW (ref 80.0–100.0)
Platelets: 242 10*3/uL (ref 150–400)
RBC: 3.49 MIL/uL — ABNORMAL LOW (ref 3.87–5.11)
RDW: 19.7 % — ABNORMAL HIGH (ref 11.5–15.5)
WBC: 14.8 10*3/uL — ABNORMAL HIGH (ref 4.0–10.5)
nRBC: 0.1 % (ref 0.0–0.2)

## 2022-12-25 LAB — BASIC METABOLIC PANEL
Anion gap: 17 — ABNORMAL HIGH (ref 5–15)
BUN: 26 mg/dL — ABNORMAL HIGH (ref 6–20)
CO2: 25 mmol/L (ref 22–32)
Calcium: 9 mg/dL (ref 8.9–10.3)
Chloride: 94 mmol/L — ABNORMAL LOW (ref 98–111)
Creatinine, Ser: 4.14 mg/dL — ABNORMAL HIGH (ref 0.44–1.00)
GFR, Estimated: 13 mL/min — ABNORMAL LOW (ref >60)
Glucose, Bld: 172 mg/dL — ABNORMAL HIGH (ref 70–99)
Potassium: 3.7 mmol/L (ref 3.5–5.1)
Sodium: 136 mmol/L (ref 135–145)

## 2022-12-25 LAB — GLUCOSE, CAPILLARY
Glucose-Capillary: 144 mg/dL — ABNORMAL HIGH (ref 70–99)
Glucose-Capillary: 182 mg/dL — ABNORMAL HIGH (ref 70–99)
Glucose-Capillary: 96 mg/dL (ref 70–99)
Glucose-Capillary: 99 mg/dL (ref 70–99)
Glucose-Capillary: 125 mg/dL — ABNORMAL HIGH (ref 70–99)

## 2022-12-25 LAB — MAGNESIUM: Magnesium: 1.5 mg/dL — ABNORMAL LOW (ref 1.7–2.4)

## 2022-12-25 MED ORDER — CHLORHEXIDINE GLUCONATE CLOTH 2 % EX PADS
6.0000 | MEDICATED_PAD | Freq: Every day | CUTANEOUS | Status: DC
  Administered 2022-12-27: 6 via TOPICAL

## 2022-12-25 MED ORDER — INSULIN ASPART 100 UNIT/ML IJ SOLN
0.0000 [IU] | INTRAMUSCULAR | Status: DC
  Administered 2022-12-25: 3 [IU] via SUBCUTANEOUS
  Administered 2022-12-25: 2 [IU] via SUBCUTANEOUS

## 2022-12-25 MED ORDER — BOOST / RESOURCE BREEZE PO LIQD CUSTOM
1.0000 | Freq: Three times a day (TID) | ORAL | Status: DC
  Administered 2022-12-25 – 2022-12-26 (×4): 1 via ORAL

## 2022-12-25 MED ORDER — MAGNESIUM SULFATE 2 GM/50ML IV SOLN
2.0000 g | Freq: Once | INTRAVENOUS | Status: AC
  Administered 2022-12-25: 2 g via INTRAVENOUS
  Filled 2022-12-25: qty 50

## 2022-12-25 NOTE — Progress Notes (Signed)
Nutrition Follow-up  DOCUMENTATION CODES:   Non-severe (moderate) malnutrition in context of chronic illness  INTERVENTION:   - Boost Breeze po TID, each supplement provides 250 kcal and 9 grams of protein  - Renal MVI daily  - Once diet advanced, Nepro Shake po TID, each supplement provides 425 kcal and 19 grams protein  NUTRITION DIAGNOSIS:   Moderate Malnutrition related to chronic illness (ESRD/HD) as evidenced by mild muscle depletion, percent weight loss (18.3% weight loss in < 6 months).  Ongoing, being addressed via oral nutrition supplements  GOAL:   Patient will meet greater than or equal to 90% of their needs  Unmet at this time due to clear liquid diet order  MONITOR:   PO intake, Supplement acceptance, Diet advancement, Labs, Weight trends, I & O's  REASON FOR ASSESSMENT:   Malnutrition Screening Tool    ASSESSMENT:   42 year old female who presented to the ED on 5/09 with abdominal pain, nausea. PMH of ESRD on HD, STEMI, bilateral AKA, osteomyelitis, T2DM, HTN, CVA, PEG tube placement and later removal. Pt admitted with possible mesenteric ischemia.  05/10 - full liquid diet 05/11 - NPO 05/12 - renal/carb modified diet, later NPO 05/14 - clear liquid diet  Discussed pt with RN and during ICU rounds. Pt with no abdominal pain or N/V. Pt had a BM yesterday and no blood in stool per RN. Pt is off pressors and WBC is downtrending. Surgery has advanced diet to clear liquids.  Last iHD was yesterday with 2000 ml net UF. Post-HD weight was 43.1 kg. Pt is well below EDW and will require new EDW at discharge.  Spoke with pt at bedside. Pt with eyes closed but spoke with RD and answered questions appropriately. Pt reports understanding of plan for clear liquid diet today. She states that she is feeling hungry and ready to have something to drink. RD explained that Boost Breeze supplement has been ordered; can transition back to Nepro once diet advanced past clears.  Pt expresses understanding.  EDW: 49.5 kg Admit weight: 39.3 kg Current weight: 38.8 kg  Medications reviewed and include: SSI q 4 hours, rena-vit, IV abx IVF: D5 in LR @ 30 ml/hr  Labs reviewed: magnesium 1.5 (pt received IV magnesium sulfate 2 grams x 1), WBC 14.8, hemoglobin 8.8 CBG's: 37-199 x 24 hours  I/O's: +5.9 L since admit  Diet Order:   Diet Order             Diet clear liquid Fluid consistency: Thin  Diet effective now                   EDUCATION NEEDS:   Education needs have been addressed  Skin:  Skin Assessment: Reviewed RN Assessment (skin tear to groin)  Last BM:  12/24/22  Height:   Ht Readings from Last 1 Encounters:  09/04/22 5\' 1"  (1.549 m)    Weight:   Wt Readings from Last 1 Encounters:  12/25/22 38.8 kg    BMI:  Body mass index is 16.16 kg/m.  Estimated Nutritional Needs:   Kcal:  1500-1700  Protein:  70-80 grams  Fluid:  1000 ml + UOP    Mertie Clause, MS, RD, LDN Inpatient Clinical Dietitian Please see AMiON for contact information.

## 2022-12-25 NOTE — Progress Notes (Addendum)
Subjective: CC: Seen with CCM, RN, pharmacy.   Patient feeling much better. No abdominal pain, n/v. BM yesterday. RN reports no reported blood in stool yesterday afternoon.   HD yesterday - 2L off. Afebrile overnight. Tachycardia resolved. Off pressors.    Objective: Vital signs in last 24 hours: Temp:  [98.1 F (36.7 C)-99.9 F (37.7 C)] 98.1 F (36.7 C) (05/14 0735) Pulse Rate:  [85-114] 85 (05/14 0800) Resp:  [0-30] 19 (05/14 0800) BP: (78-151)/(16-110) 142/66 (05/14 0800) SpO2:  [89 %-100 %] 97 % (05/14 0800) Weight:  [38.8 kg-45.1 kg] 38.8 kg (05/14 0350) Last BM Date : 12/24/22  Intake/Output from previous day: 05/13 0701 - 05/14 0700 In: 1339.2 [I.V.:1189.1; IV Piggyback:150.1] Out: 2000  Intake/Output this shift: No intake/output data recorded.  PE: Gen:  Alert, NAD, pleasant Abd: Soft, ND, completely NT. No rigidity or guarding. +BS  Lab Results:  Recent Labs    12/24/22 0400 12/25/22 0510  WBC 18.9* 14.8*  HGB 8.5* 8.8*  HCT 26.1* 27.0*  PLT 239 242    BMET Recent Labs    12/24/22 0400 12/25/22 0510  NA 138 136  K 4.2 3.7  CL 98 94*  CO2 24 25  GLUCOSE 182* 172*  BUN 58* 26*  CREATININE 7.08* 4.14*  CALCIUM 8.6* 9.0    PT/INR No results for input(s): "LABPROT", "INR" in the last 72 hours. CMP     Component Value Date/Time   NA 136 12/25/2022 0510   NA 133 (L) 05/18/2020 0927   K 3.7 12/25/2022 0510   CL 94 (L) 12/25/2022 0510   CO2 25 12/25/2022 0510   GLUCOSE 172 (H) 12/25/2022 0510   BUN 26 (H) 12/25/2022 0510   BUN 45 (H) 05/18/2020 0927   CREATININE 4.14 (H) 12/25/2022 0510   CALCIUM 9.0 12/25/2022 0510   CALCIUM 12.1 (H) 11/13/2021 2026   PROT 8.0 12/20/2022 0918   PROT 7.6 05/18/2020 0927   ALBUMIN 3.2 (L) 12/20/2022 0918   ALBUMIN 3.9 05/18/2020 0927   AST 19 12/20/2022 0918   ALT 20 12/20/2022 0918   ALKPHOS 138 (H) 12/20/2022 0918   BILITOT 0.5 12/20/2022 0918   BILITOT <0.2 05/18/2020 0927   GFRNONAA 13  (L) 12/25/2022 0510   GFRAA 6 (L) 05/18/2020 0927   Lipase     Component Value Date/Time   LIPASE 40 12/20/2022 0918    Studies/Results: No results found.  Anti-infectives: Anti-infectives (From admission, onward)    Start     Dose/Rate Route Frequency Ordered Stop   12/24/22 2000  piperacillin-tazobactam (ZOSYN) IVPB 2.25 g        2.25 g 100 mL/hr over 30 Minutes Intravenous Every 8 hours 12/24/22 1536     12/21/22 0800  piperacillin-tazobactam (ZOSYN) IVPB 2.25 g  Status:  Discontinued        2.25 g 100 mL/hr over 30 Minutes Intravenous Every 8 hours 12/21/22 0708 12/24/22 1536   12/20/22 2200  piperacillin-tazobactam (ZOSYN) IVPB 2.25 g  Status:  Discontinued       See Hyperspace for full Linked Orders Report.   2.25 g 100 mL/hr over 30 Minutes Intravenous Every 8 hours 12/20/22 1324 12/21/22 0708   12/20/22 1330  piperacillin-tazobactam (ZOSYN) IVPB 3.375 g       See Hyperspace for full Linked Orders Report.   3.375 g 100 mL/hr over 30 Minutes Intravenous  Once 12/20/22 1324 12/20/22 1409        Assessment/Plan Possible mesenteric ischemia  -  CT 5/9 with pneumatosis of transverse and proximal descending colon with associated mesenteric and portal venous gas; 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 Patient is s/p dialysis 5/8, pain began after this.  - Was improving last week however this weekend she was febrile, back on pressors with increasing pressor needs. Lactic normalized but WBC was up. CTA done the evening of 5/11 notes decreased level of colonic pneumatosis although persistent pneumatosis involving the transverse colon and splenic flexure, resolution of mesenteric venous gas, occlusion or substantial stenosis in the dominant mesenteric arterial anatomy.  Dr. Fredricka Bonine reviewed these images and felt the vascular disease is more peripheral. - Improving today. Off pressors. Afebrile over the last 24 hours. WBC downtrending. Abdominal pain resolved  and NT on exam. Okay for CLD. If tolerates, hopefully can adv slowly over the next few days. Would like to ensure she tolerates diet advancement before discharge. Cont abx. RN/CCM aware to notify us if she worsen in any way. I did discuss w/ patient while I do not think she needs emergency surgery currently and am hopefully she will improve with conservative measures, if she was to worsen again we would likely recommend exploratory surgery with bowel resection and ultimately ostomy creation. Patient reports she would want something done to make her better and would be agreeable to surgery and colostomy, if it were a matter of life or death - CCM at bedside for this conversation. We will follow with you.    FEN: CLD, IVF per primary  ID: Zosyn VTE: okay for chemical prophylaxis   - below per TRH/CCM -  T2DM with hyperglycemia - improved, off insulin gtt ESRD on HD Hx of STEMI BL AKA HTN Hx of CVA on asa and plavix per chart review  I reviewed nursing notes, hospitalist notes, last 24 h vitals and pain scores, last 48 h intake and output, last 24 h labs and trends, and last 24 h imaging results.   LOS: 5 days    Jacinto Halim ,MD [facility] 12/25/2022, 9:06 AM Please see Amion for pager number during day hours 7:00am-4:30pm

## 2022-12-25 NOTE — Progress Notes (Signed)
NEW ADMISSION NOTE New Admission Note:  Transfer  Arrival Method:  bed Mental Orientation:  a&o x4 Telemetry: box 3 Assessment: Completed Skin: see assessment and LDS IV: See lda Pain: denies Tubes: none Safety Measures: Safety Fall Prevention Plan has been given, discussed and signed Admission: Completed [facility] Orientation: Patient has been orientated to the room, unit and staff.  Family: not present  Orders have been reviewed and implemented. Will continue to monitor the patient. Call light has been placed within reach and bed alarm has been activated.   Velia Meyer, RN

## 2022-12-25 NOTE — Progress Notes (Signed)
NAME:  [name], MRN:  [id number], DOB:  [birth year], LOS: 5 ADMISSION DATE:  12/20/2022, CONSULTATION DATE:  12/20/22 REFERRING MD:  EDP, CHIEF COMPLAINT:  Abd pain   History of Present Illness:  42 yo female presented to ER with abdominal pain for 1 day.  Had elevated blood pressure and blood sugar in ER.  CT abdomen showed signs of ischemic colitis and elevated lactic acid.  Surgery recommended conservative management.  PCCM consulted to arrange for ICU admission.  Pertinent  Medical History  DM type 2, CVA, b/l BKA, ESRD on HD, Anemia, GBS Miller Fischer variant, Bed bugs, CAD, PAD  Significant Hospital Events: Including procedures, antibiotic start and stop dates in addition to other pertinent events   5/09 admit 5/10 off pressors, transfer to floor med  5/11 persistent hypotension overnight resulting in need of central access initiation of vasopressor support. 5/12 repeat CTA completed yesterday afternoon given febrile state and pressor requirement.  Per surgical evaluation images consistent with acute concern of ischemia to the level of the splenic fissure, patient declined surgical interventions  Interim History / Subjective:  Patient came off of vasopressor support this morning Stated feeling better, had bowel movement yesterday Denies abdominal pain, nausea and vomiting Started feeling hungry   Objective   Blood pressure (!) 142/66, pulse 85, temperature 98.1 F (36.7 C), temperature source Oral, resp. rate 19, weight 38.8 kg, SpO2 97 %.        Intake/Output Summary (Last 24 hours) at 12/25/2022 0949 Last data filed at 12/25/2022 0700 Gross per 24 hour  Intake 1339.15 ml  Output 2000 ml  Net -660.85 ml   Filed Weights   12/24/22 1515 12/24/22 1945 12/25/22 0350  Weight: 45.1 kg 43.1 kg 38.8 kg    Examination: Physical exam: General: Middle-aged female, lying on the bed HEENT: [place]/AT, eyes anicteric.  moist mucus membranes Neuro: Alert, awake following  commands Chest: Coarse breath sounds, no wheezes or rhonchi Heart: Regular rate and rhythm, no murmurs or gallops Abdomen: Soft, nontender, nondistended, bowel sounds present Skin: No rash  Labs and images were reviewed  Resolved Hospital Problem list   Hyperkalemia Anion gap metabolic acidosis Assessment & Plan:  Probable acute mesenteric ischemia Septic shock in the setting of probable mesenteric ischemia CT on admission showed pneumatosis of transverse and proximal descending colon with associated mesenteric and portal venous gas, 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  Pain appears to correlate with HD sessions Repeat CTA on 5/11 showing decreased: Stenosis although persistent pneumatosis of the transverse and splenic flexure.  Resolution of mesenteric and portal venous gas, occlusion/substantial stenosis in the dominant mesenteric arterial anatomy Patient denies abdominal pain, nausea and vomiting She came off of vasopressor support White count continue to trend down General surgery following, appreciate assistance, plan is to start clear liquid diet and watchful waiting Continue IV antibiotics with Zosyn She remained afebrile  ESRD Nephrology following, appreciate assistance Had hemodialysis session yesterday, tolerated well Continue midodrine to 15 mg 3 times daily  HTN, HLD, PAD. Hold Home medications include Norvasc,  Imdur, Plavix Lopressor, Continue statin  Type 1 diabetes poorly controlled with hyperglycemia, currently hypoglycemic Hemoglobin A1c 8.2 12/20/2022, on further chart review patient has been formally diagnosed type I at age 45 Continue SSI with CBG goal 140-180 Last night she had 1 hypoglycemic episode Continue D5 LR at 30 cc an hour CBG checks every 4 hours Diabetes coordinator following  Anemia of chronic disease Continue to monitor H&H and transfuse  if less than 7  Moderate Malnutrition Dietician is following   Best  Practice (right click and "Reselect all SmartList Selections" daily)   Diet/type: Start clear liquid diet DVT prophylaxis: prophylactic heparin  GI prophylaxis: N/A Lines: N/A Foley:  N/A Code Status:  full code Last date of multidisciplinary goals of care discussion [5/13: Patient was updated at bedside, decided to continue full scope of care]    Cheri Fowler, MD [facility] See Amion for pager If no response to pager, please call [phone] until 7pm After 7pm, Please call E-link [phone]

## 2022-12-25 NOTE — Inpatient Diabetes Management (Signed)
Inpatient Diabetes Program Recommendations  AACE/ADA: New Consensus Statement on Inpatient Glycemic Control (2015)  Target Ranges:  Prepandial:   less than 140 mg/dL      Peak postprandial:   less than 180 mg/dL (1-2 hours)      Critically ill patients:  140 - 180 mg/dL   Lab Results  Component Value Date   GLUCAP 182 (H) 12/25/2022   HGBA1C 8.2 (H) 12/20/2022    Review of Glycemic Control  Latest Reference Range & Units 12/24/22 21:00 12/24/22 21:40 12/24/22 23:30 12/25/22 03:38 12/25/22 07:34  Glucose-Capillary 70 - 99 mg/dL 67 (L) 846 (H) 962 (H) 144 (H) 182 (H)  (L): Data is abnormally low (H): Data is abnormally high Diabetes history: DM 1  Outpatient Diabetes medications:  Novolog 0-10 units tid with meals Semglee 30 units daily Current orders for Inpatient glycemic control:  Novolog 0-15 units Q4H     Inpatient Diabetes Program Recommendations:    Noted mild hypoglycemia following one unit of Novolog yesterday. Correction scale increased to moderate. Concern for hypoglycemia.    Consider changing correction to Novolog 0-6 units Q6H since patient NPO. Would continue to hold basal insulin until blood sugars>150 consistently. Will follow.   Thanks, Lujean Rave, MSN, RNC-OB Diabetes Coordinator [phone] (8a-5p)

## 2022-12-25 NOTE — Consult Note (Signed)
Consultation Note Date: 12/25/2022   Patient Name: [name]  DOB: [birth year]  MRN: [id number]  Age / Sex: 42 y.o., female  PCP: [facility] Referring Physician: Cheri Fowler, MD  Reason for Consultation: Establishing goals of care and Psychosocial/spiritual support  HPI/Patient Profile: 42 y.o. female  admitted on 12/20/2022 with abdominal pain for 1 day. Had elevated blood pressure and blood sugar in ER. CT abdomen showed signs of ischemic colitis and elevated lactic acid. Surgery recommended conservative management. PCCM consulted to arrange for ICU admission.   PMH significant for DM type 2, CVA, b/l BKA, ESRD on HD, Anemia, GBS Miller Fischer variant, Bed bugs, CAD, PAD   Significant Hospital events: 5/09 admit 5/10 off pressors, transfer to floor med  5/11 persistent hypotension overnight resulting in need of central access initiation of vasopressor support. 5/12 repeat CTA completed yesterday afternoon given febrile state and pressor requirement.  Per surgical evaluation images consistent with acute concern of ischemia to the level of the splenic fissure, patient declined surgical interventions  Patient faces treatment option decisions, advanced directive decisions and anticipatory care needs.  Clinical Assessment and Goals of Care:  This NP Lorinda Creed reviewed medical records, received report from team, assessed the patient and then meet at the patient's bedside  to discuss diagnosis, prognosis, GOC,  and options.   Concept of Palliative Care was introduced as specialized medical care for people and their families living with serious illness.  If focuses on providing relief from the symptoms and stress of a serious illness.  The goal is to improve quality of life for both the patient and the family.   Values and goals of care important to patient and family were  attempted to be elicited.  Created space and opportunity for family to explore thoughts and feelings regarding current medical situation.  Patient verbalizes how difficult it is living with her multiple chronic diseases.  We discussed her end-stage renal disease being dependent on dialysis and her double amputation.  She speaks to chronic fatigue and weakness.  "It is hard for people to understand my pain"  Patient shares brief life review.  She is originally from [place], she  has 6 children all boys.  She finds joy in gospel music and pod casts.  When she was able Chane enjoyed reading, now her poor vision prohibits that.  Most recently she has been a resident at [facility]     A  discussion was had today regarding advanced directives.  Concepts specific to code status, artifical feeding and hydration, continued IV antibiotics and rehospitalization was had.    The difference between a aggressive medical intervention path  and a palliative comfort care path for this patient at this time was had.     Patient is open to all offered and available medical interventions to prolong life.    Questions and concerns addressed.  Patient  encouraged to call with questions or concerns.    I encourage patient to get out of  bed today with the assistance of her nurse, at this time she has declined   PMT will continue to support holistically.          Patient has healthcare power of attorney documents naming Scientist, clinical (histocompatibility and immunogenetics) as her H POA       SUMMARY OF RECOMMENDATIONS    Code Status/Advance Care Planning: Full code Educated patient/family to consider DNR/DNI status understanding evidenced based poor outcomes in similar hospitalized patient, as the cause of arrest is likely associated with advanced chronic illness rather than an easily reversible acute cardio-pulmonary event.    Palliative Prophylaxis:  Frequent Pain Assessment  Additional Recommendations (Limitations, Scope,  Preferences): Full Scope Treatment  Psycho-social/Spiritual:  Desire for further Chaplaincy support:yes Emotional support offered, therapeutic listening  Prognosis:  Unable to determine  Discharge Planning: To Be Determined      Primary Diagnoses: Present on Admission:  Ischemic bowel disease (HCC)  Malnutrition (HCC)  Malnutrition of moderate degree   I have reviewed the medical record, interviewed the patient and family, and examined the patient. The following aspects are pertinent.  Past Medical History:  Diagnosis Date   Anemia    Anxiety    Blind right eye 2008   Diabetes mellitus without complication (HCC)    Embolic stroke (HCC)    ESRD (end stage renal disease) (HCC)    HD on M/W/F   ESRD on hemodialysis (HCC)    GBS (Guillain Barre syndrome) (HCC)    Social History   Socioeconomic History   Marital status: Single    Spouse name: Not on file   Number of children: Not on file   Years of education: Not on file   Highest education level: Not on file  Occupational History   Not on file  Tobacco Use   Smoking status: Never   Smokeless tobacco: Never  Vaping Use   Vaping Use: Never used  Substance and Sexual Activity   Alcohol use: No   Drug use: No   Sexual activity: Not on file  Other Topics Concern   Not on file  Social History Narrative   Not on file   Social Determinants of Health   Financial Resource Strain: Not on file  Food Insecurity: No Food Insecurity (12/20/2022)   Hunger Vital Sign    Worried About Running Out of Food in the Last Year: Never true    Ran Out of Food in the Last Year: Never true  Transportation Needs: No Transportation Needs (12/20/2022)   PRAPARE - Administrator, Civil Service (Medical): No    Lack of Transportation (Non-Medical): No  Physical Activity: Not on file  Stress: Not on file  Social Connections: Not on file   Family History  Problem Relation Age of Onset   Diabetes Mother    Diabetes Father     Scheduled Meds:  Chlorhexidine Gluconate Cloth  6 each Topical Daily   dorzolamide-timolol  1 drop Left Eye BID   feeding supplement  1 Container Oral TID BM   heparin injection (subcutaneous)  5,000 Units Subcutaneous Q8H   insulin aspart  0-15 Units Subcutaneous Q4H   midodrine  15 mg Oral Q8H   multivitamin  1 tablet Oral QHS   Continuous Infusions:  sodium chloride     sodium chloride     dextrose 5% lactated ringers 30 mL/hr at 12/25/22 1111   norepinephrine (LEVOPHED) Adult infusion 3 mcg/min (12/25/22 0700)   piperacillin-tazobactam (ZOSYN)  IV 2.25 g (12/25/22 1100)  PRN Meds:.acetaminophen, dextrose, docusate sodium, labetalol, oxyCODONE, polyethylene glycol Medications Prior to Admission:  Prior to Admission medications   Medication Sig Start Date End Date Taking? Authorizing Provider  acetaminophen (TYLENOL) 325 MG tablet Take 650 mg by mouth every 6 (six) hours as needed for fever.   Yes [provider]  amLODipine (NORVASC) 2.5 MG tablet Take 2.5 mg by mouth daily. 11/26/22  Yes [provider]  aspirin EC 81 MG tablet Take 1 tablet (81 mg total) by mouth daily. Swallow whole. 07/27/22  Yes Vann, Jessica U, DO  atorvastatin (LIPITOR) 40 MG tablet Place 1 tablet (40 mg total) into feeding tube daily. 10/26/21  Yes Burnadette Pop, MD  azelastine (OPTIVAR) 0.05 % ophthalmic solution Place 1 drop into both eyes 2 (two) times daily as needed (allergies). 11/13/22  Yes [provider]  cetirizine (ZYRTEC) 10 MG tablet Take 10 mg by mouth daily.   Yes [provider]  cinacalcet (SENSIPAR) 90 MG tablet Take 90 mg by mouth daily. 09/22/19  Yes [provider]  docusate sodium (COLACE) 100 MG capsule Take 100 mg by mouth at bedtime.   Yes [provider]  dorzolamide-timolol (COSOPT) 22.3-6.8 MG/ML ophthalmic solution Place 1 drop into the left eye 2 (two) times daily. 03/30/21  Yes [provider]  Glucagon, rDNA,  (GLUCAGON EMERGENCY) 1 MG KIT Inject 1 mg into the muscle once. If BS drops below 60 06/10/22  Yes [provider]  insulin aspart (NOVOLOG) 100 UNIT/ML injection Inject 0-6 Units into the skin every 4 (four) hours. Patient taking differently: Inject 0-10 Units into the skin 3 (three) times daily with meals. 121-150=1 unit 152-200=2 units 201-250=3 units 251-300=5 units 301-350=7 units 351-400=10 units 10/25/21  Yes Adhikari, Amrit, MD  insulin glargine (SEMGLEE) 100 UNIT/ML injection Inject 30 Units into the skin daily at 6 PM.   Yes [provider]  isosorbide mononitrate (IMDUR) 30 MG 24 hr tablet Take 1 tablet (30 mg total) by mouth daily. 07/27/22  Yes Vann, Jessica U, DO  melatonin 3 MG TABS tablet Take 3 mg by mouth at bedtime.   Yes [provider]  metoprolol tartrate (LOPRESSOR) 25 MG tablet Take 0.5 tablets (12.5 mg total) by mouth 2 (two) times daily. Patient taking differently: Take 25 mg by mouth 2 (two) times daily. Hold for SBP less than 110 or heart rate less than 60 07/27/22  Yes Vann, Jessica U, DO  midodrine (PROAMATINE) 5 MG tablet Take 1 tablet (5 mg total) by mouth 3 (three) times daily as needed (low blood pressure). 07/27/22  Yes Joseph Art, DO  Multiple Vitamin (MULTIVITAMIN WITH MINERALS) TABS tablet Take 1 tablet by mouth daily.   Yes [provider]  ondansetron (ZOFRAN) 4 MG tablet Take 4 mg by mouth every 6 (six) hours as needed for nausea/vomiting. 03/02/22  Yes [provider]  oxyCODONE-acetaminophen (PERCOCET) 5-325 MG tablet Take 1 tablet by mouth every 8 (eight) hours as needed for severe pain. 07/27/22  Yes Vann, Jessica U, DO  polyethylene glycol (MIRALAX / GLYCOLAX) 17 g packet Take 17 g by mouth daily at 6 PM.   Yes [provider]  Accu-Chek FastClix Lancets MISC Use as instructed to check blood sugar up to TID. E11.22 Patient taking differently: 1 each by Other route See admin instructions. Use as  instructed to check blood sugar up to TID. E11.22 04/15/19   Hoy Register, MD  Blood Glucose Monitoring Suppl (ACCU-CHEK GUIDE ME) w/Device KIT 1  kit by Does not apply route 3 (three) times daily. Use to check BG at home up to 3 times daily. E11.22 04/15/19   Hoy Register, MD  clopidogrel (PLAVIX) 75 MG tablet Take 1 tablet (75 mg total) by mouth daily. Patient not taking: Reported on 12/20/2022 07/27/22   Joseph Art, DO  Continuous Blood Gluc Transmit (DEXCOM G6 TRANSMITTER) MISC 1 Device by Does not apply route as directed. 09/12/20   Shamleffer, Konrad Dolores, MD  glucose blood (ACCU-CHEK GUIDE) test strip Use as instructed to check blood sugar up to TID. E11.22 Patient taking differently: 1 each by Other route See admin instructions. Use as instructed to check blood sugar up to TID. E11.22 07/17/19   Fulp, Cammie, MD  Insulin Pen Needle 32G X 4 MM MISC 1 Device by Does not apply route as directed. 05/13/20   Shamleffer, Konrad Dolores, MD  Nutritional Supplements (NUTRITIONAL DRINK) LIQD Take 120 mLs by mouth daily.    [provider]  nystatin (MYCOSTATIN) 100000 UNIT/ML suspension Take 5 mLs (500,000 Units total) by mouth 4 (four) times daily. Patient not taking: Reported on 12/20/2022 10/25/21   Burnadette Pop, MD  sodium chloride flush (NS) 0.9 % SOLN Inject 10 mLs into the vein at bedtime.    [provider]  thiamine 100 MG tablet Place 1 tablet (100 mg total) into feeding tube daily. Patient not taking: Reported on 12/20/2022 10/26/21   Burnadette Pop, MD   Allergies  Allergen Reactions   Morphine Rash and Other (See Comments)   Peanut-Containing Drug Products Anaphylaxis and Hives   Penicillins Rash and Other (See Comments)    Rash in 2008.  Tolerated cefazolin in 2020   Chocolate Hives   Shellfish Allergy Swelling   Review of Systems  Constitutional:  Positive for fatigue.    Physical Exam Cardiovascular:     Rate and Rhythm: Normal rate.  Skin:     General: Skin is warm and dry.  Neurological:     Mental Status: She is alert and oriented to person, place, and time.     Vital Signs: BP (!) 163/83   Pulse 81   Temp 98.2 F (36.8 C) (Oral)   Resp 15   Wt 38.8 kg   SpO2 99%   BMI 16.16 kg/m  Pain Scale: 0-10   Pain Score: Asleep   SpO2: SpO2: 99 % O2 Device:SpO2: 99 % O2 Flow Rate: .   IO: Intake/output summary:  Intake/Output Summary (Last 24 hours) at 12/25/2022 1411 Last data filed at 12/25/2022 0700 Gross per 24 hour  Intake 858.46 ml  Output 2000 ml  Net -1141.54 ml    LBM: Last BM Date : 12/24/22 Baseline Weight: Weight: 39.3 kg Most recent weight: Weight: 38.8 kg     Palliative Assessment/Data:  40 %     75 minutes      Discussed with Dr. Merrily Pew and bedside RN   and bedside RN Signed by: Lorinda Creed, NP   Please contact Palliative Medicine Team phone at [phone] for questions and concerns.  For individual provider: See Loretha Stapler

## 2022-12-25 NOTE — Progress Notes (Signed)
  [facility] Progress Note   Subjective:  Seen in room, no new c/o's.   Objective Vitals:   12/24/22 1315 12/24/22 1330 12/24/22 1345 12/24/22 1400  BP: (!) 145/56 (!) 149/22 (!) 151/33 (!) 139/43  Pulse: (!) 111 (!) 110 (!) 109 (!) 106  Resp: (!) 21 (!) 25 (!) 23 11  Temp:      TempSrc:      SpO2: 91% 92% 91% 90%  Weight:       Physical Exam General: Chronically ill woman, A&O x 3 Heart: RRR; 2/6 murmur Lungs: CTA anteriorly Abdomen: mild abd tenderness, no rebound Extremities: B AKA, no edema Dialysis Access: RUE AVF + bruit  OP HD: [facility] 3h 45 min    49.5kg  2K/2Ca bath   AVF    P#2   Heparin none - last HD 5/8 - Mircera IV q 2 weeks - last 5/3 - Venofer 50mg  IV weekly   Assessment/Plan:  Bowel ischemia per CT 12/20/22: Blood in stool. Surgery following. Fevers and WBC appear to be improving. Pt states she is feeling better.   HyPOtension-  Cont Midodrine at higher dose of 15 mg TID. Levo gtt down to 3-4 ug/min today.   Volume - no edema on exam, wt's are down way under dry wt (10kg). On RA.   ESRD - usual HD is MWF.  HD tomorrow.  Anemia: Hgb 9.6 - transfuse as needed; she's not going to respond to ESA with all the ongoing inflammation.  Metabolic bone disease: Ca ok, resume binder once allowed to eat full diet.  T2DM  H/o CVA  H/o bilat AKA   Vinson Moselle, MD 12/25/2022, 3:56 PM  Recent Labs  Lab 12/20/22 0918 12/20/22 0946 12/24/22 0400 12/25/22 0510  HGB 12.9   < > 8.5* 8.8*  ALBUMIN 3.2*  --   --   --   CALCIUM 8.6*   < > 8.6* 9.0  CREATININE 5.91*   < > 7.08* 4.14*  K 5.6*   < > 4.2 3.7   < > = values in this interval not displayed.     Inpatient medications:  Chlorhexidine Gluconate Cloth  6 each Topical Daily   dorzolamide-timolol  1 drop Left Eye BID   feeding supplement  1 Container Oral TID BM   heparin injection (subcutaneous)  5,000 Units Subcutaneous Q8H   insulin aspart  0-15 Units Subcutaneous Q4H   midodrine   15 mg Oral Q8H   multivitamin  1 tablet Oral QHS    sodium chloride     sodium chloride     dextrose 5% lactated ringers 30 mL/hr at 12/25/22 1111   norepinephrine (LEVOPHED) Adult infusion 3 mcg/min (12/25/22 0700)   piperacillin-tazobactam (ZOSYN)  IV 2.25 g (12/25/22 1100)   acetaminophen, dextrose, docusate sodium, labetalol, oxyCODONE, polyethylene glycol

## 2022-12-26 DIAGNOSIS — K559 Vascular disorder of intestine, unspecified: Secondary | ICD-10-CM | POA: Diagnosis not present

## 2022-12-26 LAB — RENAL FUNCTION PANEL
Albumin: 1.9 g/dL — ABNORMAL LOW (ref 3.5–5.0)
Anion gap: 16 — ABNORMAL HIGH (ref 5–15)
BUN: 34 mg/dL — ABNORMAL HIGH (ref 6–20)
CO2: 26 mmol/L (ref 22–32)
Calcium: 8.6 mg/dL — ABNORMAL LOW (ref 8.9–10.3)
Chloride: 96 mmol/L — ABNORMAL LOW (ref 98–111)
Creatinine, Ser: 5.42 mg/dL — ABNORMAL HIGH (ref 0.44–1.00)
GFR, Estimated: 10 mL/min — ABNORMAL LOW (ref >60)
Glucose, Bld: 115 mg/dL — ABNORMAL HIGH (ref 70–99)
Phosphorus: 5.2 mg/dL — ABNORMAL HIGH (ref 2.5–4.6)
Potassium: 3.6 mmol/L (ref 3.5–5.1)
Sodium: 138 mmol/L (ref 135–145)

## 2022-12-26 LAB — GLUCOSE, CAPILLARY
Glucose-Capillary: 104 mg/dL — ABNORMAL HIGH (ref 70–99)
Glucose-Capillary: 105 mg/dL — ABNORMAL HIGH (ref 70–99)
Glucose-Capillary: 148 mg/dL — ABNORMAL HIGH (ref 70–99)
Glucose-Capillary: 80 mg/dL (ref 70–99)
Glucose-Capillary: 91 mg/dL (ref 70–99)

## 2022-12-26 LAB — CBC
HCT: 24.9 % — ABNORMAL LOW (ref 36.0–46.0)
Hemoglobin: 8.2 g/dL — ABNORMAL LOW (ref 12.0–15.0)
MCH: 25.9 pg — ABNORMAL LOW (ref 26.0–34.0)
MCHC: 32.9 g/dL (ref 30.0–36.0)
MCV: 78.8 fL — ABNORMAL LOW (ref 80.0–100.0)
Platelets: 264 10*3/uL (ref 150–400)
RBC: 3.16 MIL/uL — ABNORMAL LOW (ref 3.87–5.11)
RDW: 19.8 % — ABNORMAL HIGH (ref 11.5–15.5)
WBC: 10.4 10*3/uL (ref 4.0–10.5)
nRBC: 0.3 % — ABNORMAL HIGH (ref 0.0–0.2)

## 2022-12-26 LAB — CULTURE, BLOOD (ROUTINE X 2)

## 2022-12-26 MED ORDER — HEPARIN SODIUM (PORCINE) 1000 UNIT/ML IJ SOLN
INTRAMUSCULAR | Status: AC
  Administered 2022-12-26: 2000 [IU]
  Filled 2022-12-26: qty 3

## 2022-12-26 MED ORDER — INSULIN ASPART 100 UNIT/ML IJ SOLN
0.0000 [IU] | Freq: Three times a day (TID) | INTRAMUSCULAR | Status: DC
  Administered 2022-12-27: 2 [IU] via SUBCUTANEOUS
  Administered 2022-12-28: 3 [IU] via SUBCUTANEOUS

## 2022-12-26 MED ORDER — INSULIN ASPART 100 UNIT/ML IJ SOLN: 0.0000 [IU] | Freq: Every day | INTRAMUSCULAR | Status: DC

## 2022-12-26 NOTE — Progress Notes (Signed)
  [facility] Progress Note   Subjective:  Seen in HD, no new c/o's.   Objective Vitals:   12/24/22 1315 12/24/22 1330 12/24/22 1345 12/24/22 1400  BP: (!) 145/56 (!) 149/22 (!) 151/33 (!) 139/43  Pulse: (!) 111 (!) 110 (!) 109 (!) 106  Resp: (!) 21 (!) 25 (!) 23 11  Temp:      TempSrc:      SpO2: 91% 92% 91% 90%  Weight:       Physical Exam General: Chronically ill woman, A&O x 3 Heart: RRR; 2/6 murmur Lungs: CTA anteriorly Abdomen: mild abd tenderness, no rebound Extremities: B AKA, no edema Dialysis Access: RUE AVF + bruit  OP HD: [facility] 3h 45 min    49.5kg  2K/2Ca bath   AVF    P#2   Heparin none - last HD 5/8 - Mircera IV q 2 weeks - last 5/3 - Venofer 50mg  IV weekly   Assessment/Plan:  Bowel ischemia per CT 12/20/22: Blood in stool. Surgery following. Fevers and WBC appear to be improving. Pt states she is feeling better.   HyPOtension-  Cont Midodrine at higher dose of 15 mg TID. Weaned off levo gtt.   Volume - no edema on exam, wt's are way down under dry wt. On RA.   ESRD - usual HD is MWF.  HD today.  Anemia: Hgb 9.6 - transfuse as needed; she's not going to respond to ESA with all the ongoing inflammation.  Metabolic bone disease: Ca ok, resume binder once allowed to eat full diet.  T2DM  H/o CVA  H/o bilat AKA   Vinson Moselle, MD 12/26/2022, 10:55 AM  Recent Labs  Lab 12/20/22 0918 12/20/22 0946 12/25/22 0510 12/26/22 0900 12/26/22 0939  HGB 12.9   < > 8.8*  --  8.2*  ALBUMIN 3.2*  --   --  1.9*  --   CALCIUM 8.6*   < > 9.0 8.6*  --   PHOS  --   --   --  5.2*  --   CREATININE 5.91*   < > 4.14* 5.42*  --   K 5.6*   < > 3.7 3.6  --    < > = values in this interval not displayed.     Inpatient medications:  Chlorhexidine Gluconate Cloth  6 each Topical Daily   Chlorhexidine Gluconate Cloth  6 each Topical Q0600   dorzolamide-timolol  1 drop Left Eye BID   feeding supplement  1 Container Oral TID BM   heparin injection  (subcutaneous)  5,000 Units Subcutaneous Q8H   insulin aspart  0-15 Units Subcutaneous Q4H   midodrine  15 mg Oral Q8H   multivitamin  1 tablet Oral QHS    sodium chloride     sodium chloride     dextrose 5% lactated ringers 30 mL/hr at 12/25/22 1111   norepinephrine (LEVOPHED) Adult infusion 3 mcg/min (12/25/22 0700)   piperacillin-tazobactam (ZOSYN)  IV 2.25 g (12/26/22 0528)   acetaminophen, dextrose, docusate sodium, labetalol, polyethylene glycol

## 2022-12-26 NOTE — Progress Notes (Signed)
Subjective: CC: Seen in HD She continues to feel better and no n/v/abdominal pain with clear liquids. Had BM yesterday without blood. Afebrile. On  Patient feeling much better. No abdominal pain, n/v. BM yesterday. RN reports no reported blood in stool yesterday afternoon.   Objective: Vital signs in last 24 hours: Temp:  [98.2 F (36.8 C)-99.2 F (37.3 C)] 98.2 F (36.8 C) (05/15 0830) Pulse Rate:  [70-82] 70 (05/15 1000) Resp:  [4-21] 14 (05/15 1000) BP: (98-181)/(16-100) 114/48 (05/15 1000) SpO2:  [92 %-100 %] 99 % (05/15 0931) Weight:  [57.9 kg] 57.9 kg (05/15 0830) Last BM Date : 12/25/22  Intake/Output from previous day: 05/14 0701 - 05/15 0700 In: 1145.6 [P.O.:460; I.V.:609.9; IV Piggyback:75.7] Out: -  Intake/Output this shift: Total I/O In: 240 [P.O.:240] Out: -   PE: Gen:  Alert, NAD, pleasant Abd: Soft, ND, completely NT. No rigidity or guarding  Lab Results:  Recent Labs    12/25/22 0510 12/26/22 0939  WBC 14.8* 10.4  HGB 8.8* 8.2*  HCT 27.0* 24.9*  PLT 242 264    BMET Recent Labs    12/24/22 0400 12/25/22 0510  NA 138 136  K 4.2 3.7  CL 98 94*  CO2 24 25  GLUCOSE 182* 172*  BUN 58* 26*  CREATININE 7.08* 4.14*  CALCIUM 8.6* 9.0    PT/INR No results for input(s): "LABPROT", "INR" in the last 72 hours. CMP     Component Value Date/Time   NA 136 12/25/2022 0510   NA 133 (L) 05/18/2020 0927   K 3.7 12/25/2022 0510   CL 94 (L) 12/25/2022 0510   CO2 25 12/25/2022 0510   GLUCOSE 172 (H) 12/25/2022 0510   BUN 26 (H) 12/25/2022 0510   BUN 45 (H) 05/18/2020 0927   CREATININE 4.14 (H) 12/25/2022 0510   CALCIUM 9.0 12/25/2022 0510   CALCIUM 12.1 (H) 11/13/2021 2026   PROT 8.0 12/20/2022 0918   PROT 7.6 05/18/2020 0927   ALBUMIN 3.2 (L) 12/20/2022 0918   ALBUMIN 3.9 05/18/2020 0927   AST 19 12/20/2022 0918   ALT 20 12/20/2022 0918   ALKPHOS 138 (H) 12/20/2022 0918   BILITOT 0.5 12/20/2022 0918   BILITOT <0.2 05/18/2020 0927    GFRNONAA 13 (L) 12/25/2022 0510   GFRAA 6 (L) 05/18/2020 0927   Lipase     Component Value Date/Time   LIPASE 40 12/20/2022 0918    Studies/Results: No results found.  Anti-infectives: Anti-infectives (From admission, onward)    Start     Dose/Rate Route Frequency Ordered Stop   12/24/22 2000  piperacillin-tazobactam (ZOSYN) IVPB 2.25 g        2.25 g 100 mL/hr over 30 Minutes Intravenous Every 8 hours 12/24/22 1536 12/26/22 2359   12/21/22 0800  piperacillin-tazobactam (ZOSYN) IVPB 2.25 g  Status:  Discontinued        2.25 g 100 mL/hr over 30 Minutes Intravenous Every 8 hours 12/21/22 0708 12/24/22 1536   12/20/22 2200  piperacillin-tazobactam (ZOSYN) IVPB 2.25 g  Status:  Discontinued       See Hyperspace for full Linked Orders Report.   2.25 g 100 mL/hr over 30 Minutes Intravenous Every 8 hours 12/20/22 1324 12/21/22 0708   12/20/22 1330  piperacillin-tazobactam (ZOSYN) IVPB 3.375 g       See Hyperspace for full Linked Orders Report.   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 Patient is s/p dialysis 5/8, pain began after this.  - CTA 5/11 decreased level of colonic pneumatosis although persistent pneumatosis involving the transverse colon and splenic flexure, resolution of mesenteric venous gas, occlusion or substantial stenosis in the dominant mesenteric arterial anatomy.  Dr. Fredricka Bonine reviewed these images and felt the vascular disease is more peripheral. - continues to improve and tolerating CLD without abdominal pain. Off pressors. Afebrile over the last 24 hours. WBC now normal. - advance to FLD. Would like to ensure she tolerates diet advancement before discharge. Cont abx.  - if she was to worsen  again we would likely recommend exploratory surgery with bowel resection and ultimately ostomy creation. Patient reports she would want something done to make her better and would be agreeable to surgery and colostomy, if it were a matter of life or death   FEN: FLD, IVF per primary  ID: Zosyn VTE: heparin subq   - below per TRH/CCM -  T2DM with hyperglycemia - improved, off insulin gtt ESRD on HD Hx of STEMI BL AKA HTN Hx of CVA on asa and plavix per chart review  I reviewed nursing notes, hospitalist notes, last 24 h vitals and pain scores, last 48 h intake and output, last 24 h labs and trends, and last 24 h imaging results.   LOS: 6 days    Eric Form, [facility] 12/26/2022, 10:39 AM Please see Amion for pager number during day hours 7:00am-4:30pm

## 2022-12-26 NOTE — Progress Notes (Signed)
PROGRESS NOTE  [name]  DOB: 1981-04-27  PCP: [facility] ZOX:[id number]  DOA: 12/20/2022  LOS: 6 days  Hospital Day: 7  Brief narrative: [name] is a 42 y.o. female with PMH significant for ESRD HD MWF, DM1 since age 27, HTN, CVA, h/o bilateral BKA, chronic anemia 5/9, patient presented to the ED with complaint of abdominal pain for 1 day. CT on admission showed pneumatosis of transverse and proximal descending colon with associated mesenteric and portal venous gas, 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  Per patient, pain appears to correlate with HD sessions Initial labs also showed elevated lactic acid Seen by general surgery.  Recommended further management at the time.   Patient also had marked hyperglycemic requiring insulin drip Because of complexity of the case, he was admitted to ICU.  Required to be on pressors briefly. 5/10, transferred to medical floor. 5/11 repeat CTA abdomen pelvis showed images consistent with acute concern of ischemia to the level of the splenic fissure Patient refused surgical intervention at the time.  Subjective: Patient was seen and examined this afternoon. Propped up in bed.  Trying to take dinner.  Asking for something that she could chew.  Not in distress.  Assessment and plan: Septic shock in the setting of acute mesenteric ischemia Initially admitted to ICU for acute mesenteric ischemia and required pressors briefly for hypotension. Patient started having fever this CT angio abdomen was repeated on 5/11 which showed decreasing stenosis but persistent pneumatosis of transverse colon and splenic flexure. Required vasopressor support again.  Gradually weaned down. Currently on IV Zosyn. White count continues to downtrend. General surgery following.  Currently on full liquid diet. Recent Labs  Lab 12/20/22 1340 12/22/22 0227 12/22/22 1852 12/23/22 0407 12/23/22 1108  12/23/22 1144 12/24/22 0400 12/25/22 0510 12/26/22 0939  WBC  --  15.4*  --  21.1*  --   --  18.9* 14.8* 10.4  LATICACIDVEN 3.1* 1.1 0.9  --  1.6 1.1  --   --   --      ESRD-HD-MWF Hypotension Nephrology following Continue midodrine to 15 mg 3 times daily PTA on Lopressor, Norvasc, Imdur.  Currently on hold.  Type 1 diabetes mellitus uncontrolled A1c 8.1 12/20/2022.  Diabetic since age 82 PTA on Lantus, sliding scale insulin Currently only on sliding scale insulin with Accu-Cheks. I see that patient is on D5 drip at 30 mill per hour last 5 days.  I would stop it at this time given dialysis status. Diabetes care coordinator consulted. Recent Labs  Lab 12/25/22 2014 12/26/22 0000 12/26/22 0344 12/26/22 0734 12/26/22 1631  GLUCAP 125* 91 80 105* 104*   PAD s/ p prior bl BKA  HLD PTA on Plavix, statin  Chronic microcytic anemia Hemoglobin stable between 8 and 9. Recent Labs    07/25/22 0532 07/26/22 0654 12/22/22 0227 12/23/22 0407 12/24/22 0400 12/25/22 0510 12/26/22 0939  HGB 9.8*   < > 10.0* 9.6* 8.5* 8.8* 8.2*  MCV 85.3   < > 81.1 78.2* 77.2* 77.4* 78.8*  FERRITIN 562*  --   --   --   --   --   --   TIBC 179*  --   --   --   --   --   --   IRON 56  --   --   --   --   --   --    < > = values in this interval not displayed.  Moderate Malnutrition Dietician is following    Mobility: Bilateral BKA status  Goals of care   Code Status: Full Code     DVT prophylaxis:  heparin injection 5,000 Units Start: 12/21/22 1000 SCDs Start: 12/20/22 1304   Antimicrobials: IV Zosyn Fluid: Stop IV fluid Consultants: Nephrology Family Communication: None at bedside  Status: Inpatient Level of care:  Med-Surg   Patient from: Home Anticipated d/c to: Pending clinical course Needs to continue in-hospital care:  Diet being gradually advanced       Diet:  Diet Order             Diet full liquid Room service appropriate? Yes; Fluid consistency: Thin  Diet  effective now                   Scheduled Meds:  Chlorhexidine Gluconate Cloth  6 each Topical Daily   Chlorhexidine Gluconate Cloth  6 each Topical Q0600   dorzolamide-timolol  1 drop Left Eye BID   feeding supplement  1 Container Oral TID BM   heparin injection (subcutaneous)  5,000 Units Subcutaneous Q8H   insulin aspart  0-5 Units Subcutaneous QHS   [START ON 12/27/2022] insulin aspart  0-9 Units Subcutaneous TID WC   midodrine  15 mg Oral Q8H   multivitamin  1 tablet Oral QHS    PRN meds: acetaminophen, dextrose, docusate sodium, labetalol, polyethylene glycol   Infusions:   sodium chloride     sodium chloride     piperacillin-tazobactam (ZOSYN)  IV 2.25 g (12/26/22 0528)    Antimicrobials: Anti-infectives (From admission, onward)    Start     Dose/Rate Route Frequency Ordered Stop   12/24/22 2000  piperacillin-tazobactam (ZOSYN) IVPB 2.25 g        2.25 g 100 mL/hr over 30 Minutes Intravenous Every 8 hours 12/24/22 1536 12/26/22 2359   12/21/22 0800  piperacillin-tazobactam (ZOSYN) IVPB 2.25 g  Status:  Discontinued        2.25 g 100 mL/hr over 30 Minutes Intravenous Every 8 hours 12/21/22 0708 12/24/22 1536   12/20/22 2200  piperacillin-tazobactam (ZOSYN) IVPB 2.25 g  Status:  Discontinued       See Hyperspace for full Linked Orders Report.   2.25 g 100 mL/hr over 30 Minutes Intravenous Every 8 hours 12/20/22 1324 12/21/22 0708   12/20/22 1330  piperacillin-tazobactam (ZOSYN) IVPB 3.375 g       See Hyperspace for full Linked Orders Report.   3.375 g 100 mL/hr over 30 Minutes Intravenous  Once 12/20/22 1324 12/20/22 1409       Nutritional status:  Body mass index is 24.12 kg/m.  Nutrition Problem: Moderate Malnutrition Etiology: chronic illness (ESRD/HD) Signs/Symptoms: mild muscle depletion, percent weight loss (18.3% weight loss in < 6 months) Percent weight loss: 18.3 %     Objective: Vitals:   12/26/22 1348 12/26/22 1632  BP: (!) 102/43 (!)  105/53  Pulse:  81  Resp:  17  Temp:  98.3 F (36.8 C)  SpO2:  100%    Intake/Output Summary (Last 24 hours) at 12/26/2022 1729 Last data filed at 12/26/2022 1509 Gross per 24 hour  Intake 1773.73 ml  Output --  Net 1773.73 ml   Filed Weights   12/24/22 1945 12/25/22 0350 12/26/22 0830  Weight: 43.1 kg 38.8 kg 57.9 kg   Weight change:  Body mass index is 24.12 kg/m.   Physical Exam: General exam: Pleasant unfortunate young African-American female Skin: No rashes, lesions or ulcers. HEENT: Atraumatic,  normocephalic, no obvious bleeding Lungs: Clear to auscultation bilaterally CVS: Regular rate and rhythm, no murmur GI/Abd soft, mild diffuse tenderness present, bowel sound present CNS: Alert, awake, oriented x 3.  Visually impaired at baseline Psychiatry: Sad affect Extremities: Bilateral BKA status  Data Review: I have personally reviewed the laboratory data and studies available.  F/u labs ordered Unresulted Labs (From admission, onward)    None       Total time spent in review of labs and imaging, patient evaluation, formulation of plan, documentation and communication with family: 45 minutes  Signed, Lorin Glass, MD [facility] 12/26/2022

## 2022-12-27 DIAGNOSIS — K559 Vascular disorder of intestine, unspecified: Secondary | ICD-10-CM | POA: Diagnosis not present

## 2022-12-27 LAB — GLUCOSE, CAPILLARY
Glucose-Capillary: 82 mg/dL (ref 70–99)
Glucose-Capillary: 92 mg/dL (ref 70–99)
Glucose-Capillary: 173 mg/dL — ABNORMAL HIGH (ref 70–99)
Glucose-Capillary: 152 mg/dL — ABNORMAL HIGH (ref 70–99)

## 2022-12-27 MED ORDER — CHLORHEXIDINE GLUCONATE CLOTH 2 % EX PADS: 6.0000 | MEDICATED_PAD | Freq: Every day | CUTANEOUS | Status: DC

## 2022-12-27 MED ORDER — MIDODRINE HCL 5 MG PO TABS
10.0000 mg | ORAL_TABLET | Freq: Three times a day (TID) | ORAL | Status: DC
  Administered 2022-12-27 – 2022-12-28 (×3): 10 mg via ORAL
  Filled 2022-12-27 (×4): qty 2

## 2022-12-27 MED ORDER — LOPERAMIDE HCL 2 MG PO CAPS
2.0000 mg | ORAL_CAPSULE | Freq: Four times a day (QID) | ORAL | Status: DC | PRN
  Administered 2022-12-27: 2 mg via ORAL
  Filled 2022-12-27: qty 1

## 2022-12-27 NOTE — Progress Notes (Signed)
Patient refused 0600 Vital Signs.  Bernie Covey RN

## 2022-12-27 NOTE — Progress Notes (Signed)
Trauma/Critical Care Follow Up Note  Subjective:    Overnight Issues:   Objective:  Vital signs for last 24 hours: Temp:  [98.2 F (36.8 C)-98.8 F (37.1 C)] 98.8 F (37.1 C) (05/15 2023) Pulse Rate:  [67-81] 79 (05/15 2023) Resp:  [4-23] 18 (05/15 2023) BP: (85-124)/(16-57) 107/57 (05/15 2023) SpO2:  [97 %-100 %] 100 % (05/15 2023) Weight:  [57.9 kg] 57.9 kg (05/15 0830)  Hemodynamic parameters for last 24 hours:    Intake/Output from previous day: 05/15 0701 - 05/16 0700 In: 999.1 [P.O.:540; I.V.:334.9; IV Piggyback:124.3] Out: 0   Intake/Output this shift: No intake/output data recorded.  Vent settings for last 24 hours:    Physical Exam:  Gen: comfortable, no distress Neuro: follows commands, alert, communicative HEENT: PERRL Neck: supple CV: RRR Pulm: unlabored breathing on RA Abd: soft, NT    Extr: wwp, no edema  Results for orders placed or performed during the hospital encounter of 12/20/22 (from the past 24 hour(s))  Renal function panel     Status: Abnormal   Collection Time: 12/26/22  9:00 AM  Result Value Ref Range   Sodium 138 135 - 145 mmol/L   Potassium 3.6 3.5 - 5.1 mmol/L   Chloride 96 (L) 98 - 111 mmol/L   CO2 26 22 - 32 mmol/L   Glucose, Bld 115 (H) 70 - 99 mg/dL   BUN 34 (H) 6 - 20 mg/dL   Creatinine, Ser 1.61 (H) 0.44 - 1.00 mg/dL   Calcium 8.6 (L) 8.9 - 10.3 mg/dL   Phosphorus 5.2 (H) 2.5 - 4.6 mg/dL   Albumin 1.9 (L) 3.5 - 5.0 g/dL   GFR, Estimated 10 (L) >60 mL/min   Anion gap 16 (H) 5 - 15  CBC     Status: Abnormal   Collection Time: 12/26/22  9:39 AM  Result Value Ref Range   WBC 10.4 4.0 - 10.5 K/uL   RBC 3.16 (L) 3.87 - 5.11 MIL/uL   Hemoglobin 8.2 (L) 12.0 - 15.0 g/dL   HCT 09.6 (L) 04.5 - 40.9 %   MCV 78.8 (L) 80.0 - 100.0 fL   MCH 25.9 (L) 26.0 - 34.0 pg   MCHC 32.9 30.0 - 36.0 g/dL   RDW 81.1 (H) 91.4 - 78.2 %   Platelets 264 150 - 400 K/uL   nRBC 0.3 (H) 0.0 - 0.2 %  Glucose, capillary     Status: Abnormal    Collection Time: 12/26/22  4:31 PM  Result Value Ref Range   Glucose-Capillary 104 (H) 70 - 99 mg/dL  Glucose, capillary     Status: Abnormal   Collection Time: 12/26/22  8:23 PM  Result Value Ref Range   Glucose-Capillary 148 (H) 70 - 99 mg/dL  Glucose, capillary     Status: None   Collection Time: 12/27/22  7:30 AM  Result Value Ref Range   Glucose-Capillary 82 70 - 99 mg/dL    Assessment & Plan:  Present on Admission:  Ischemic bowel disease (HCC)  Malnutrition (HCC)  Malnutrition of moderate degree    LOS: 7 days   Additional comments:I reviewed the patient's new clinical lab test results.   and I reviewed the patients new imaging test results.    Possible mesenteric ischemia  - CT 5/9 with pneumatosis of transverse and proximal descending colon with associated mesenteric and portal venous gas; 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 Patient is s/p dialysis 5/8, pain began after this.  - CTA 5/11 decreased level  of colonic pneumatosis although persistent pneumatosis involving the transverse colon and splenic flexure, resolution of mesenteric venous gas, occlusion or substantial stenosis in the dominant mesenteric arterial anatomy.  Dr. Fredricka Bonine reviewed these images and felt the vascular disease is more peripheral. - continues to improve and tolerating FLD without abdominal pain. Off pressors. Afebrile over the last 24 hours. WBC now normal. - advance to regular diet. Would like to ensure she tolerates diet advancement before discharge. Okay to d/c abx from surgery standpoint.  - if she was to worsen again we would likely recommend exploratory surgery with bowel resection and ultimately ostomy creation. Patient reports she would want something done to make her better and would be agreeable to surgery and colostomy, if it were a matter of life or death   FEN: FLD, IVF per primary  ID: Zosyn VTE: heparin subq   - below per TRH/CCM -  T2DM with  hyperglycemia - improved, off insulin gtt ESRD on HD Hx of STEMI BL AKA HTN Hx of CVA on asa and plavix per chart review  Anticipate readiness for discharge 5/17 from surgical standpoint.   Diamantina Monks, MD [facility] Please use [email or web address] to contact on call provider  12/27/2022  *Care during the described time interval was provided by me. I have reviewed this patient's available data, including medical history, events of note, physical examination and test results as part of my evaluation.

## 2022-12-27 NOTE — Progress Notes (Signed)
   [facility] Progress Note   Subjective:  Seen in room.   Objective Vitals:   12/24/22 1315 12/24/22 1330 12/24/22 1345 12/24/22 1400  BP: (!) 145/56 (!) 149/22 (!) 151/33 (!) 139/43  Pulse: (!) 111 (!) 110 (!) 109 (!) 106  Resp: (!) 21 (!) 25 (!) 23 11  Temp:      TempSrc:      SpO2: 91% 92% 91% 90%  Weight:       Physical Exam General: Chronically ill woman, A&O x 3 Heart: RRR; 2/6 murmur Lungs: CTA anteriorly Abdomen: mild abd tenderness, no rebound Extremities: B AKA, no edema Dialysis Access: RUE AVF + bruit  OP HD: MWF [facility] 3h 45 min    49.5kg  2K/2Ca bath   AVF    P#2   Heparin none - last HD 5/8 - Mircera IV q 2 weeks - last 5/3 - Venofer 50mg  IV weekly   Assessment/Plan:  Bowel ischemia per CT 12/20/22: Blood in stool. Surgery following. Fevers and WBC improving. Pt states she is feeling better. Per gen surg can be dc'd tomorrow if diet advancing.    HyPOtension-  weaned off levo gtt, had ^'d midodrine from home 5mg  tid to 15mg  tid here. Will lower to 10mg  tid today.   Volume - no edema on exam, wt's are up and down. UF 2-3 L as tolerated w/ next HD.   ESRD - usual HD is MWF.  HD Friday.  Anemia: Hgb 9s --> 8's now. Transfuse prn. Due for esa 5/17 if still here.   Metabolic bone disease: Ca and phos in range. No binder listed. Follow.   T2DM  H/o CVA  H/o bilat AKA   Vinson Moselle, MD 12/27/2022, 3:28 PM  Recent Labs  Lab 12/25/22 0510 12/26/22 0900 12/26/22 0939  HGB 8.8*  --  8.2*  ALBUMIN  --  1.9*  --   CALCIUM 9.0 8.6*  --   PHOS  --  5.2*  --   CREATININE 4.14* 5.42*  --   K 3.7 3.6  --      Inpatient medications:  Chlorhexidine Gluconate Cloth  6 each Topical Daily   Chlorhexidine Gluconate Cloth  6 each Topical Q0600   dorzolamide-timolol  1 drop Left Eye BID   feeding supplement  1 Container Oral TID BM   heparin injection (subcutaneous)  5,000 Units Subcutaneous Q8H   insulin aspart  0-5 Units Subcutaneous QHS    insulin aspart  0-9 Units Subcutaneous TID WC   midodrine  15 mg Oral Q8H   multivitamin  1 tablet Oral QHS    sodium chloride     sodium chloride     acetaminophen, dextrose, docusate sodium, labetalol, loperamide, polyethylene glycol
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
## 2022-12-27 NOTE — Progress Notes (Signed)
PROGRESS NOTE  [name]  DOB: 15-Dec-1980  PCP: [facility] ZOX:[id number]  DOA: 12/20/2022  LOS: 7 days  Hospital Day: 8  Brief narrative: [name] is a 42 y.o. female with PMH significant for ESRD HD MWF, DM1 since age 52, HTN, CVA, h/o bilateral BKA, chronic anemia who is a long-term nursing home resident 5/9, patient was brought to the ED with complaint of abdominal pain for 1 day. CT on admission showed pneumatosis of transverse and proximal descending colon with associated mesenteric and portal venous gas, 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  Per patient, pain appears to correlate with HD sessions Initial labs also showed elevated lactic acid Seen by general surgery.  Recommended further management at the time.   Patient also had marked hyperglycemic requiring insulin drip Because of complexity of the case, he was admitted to ICU.  Required to be on pressors briefly. 5/10, transferred to medical floor. 5/11 repeat CTA abdomen pelvis showed images consistent with acute concern of ischemia to the level of the splenic fissure Patient refused surgical intervention at the time.  Subjective: Patient was seen and examined this morning.  Lying in bed.  Not in distress. General surgery advance diet to regular consistency. Having loose bowel movement today.  Assessment and plan: Septic shock in the setting of acute mesenteric ischemia Initially admitted to ICU for acute mesenteric ischemia and required pressors briefly for hypotension. Patient started having fever this CT angio abdomen was repeated on 5/11 which showed decreasing stenosis but persistent pneumatosis of transverse colon and splenic flexure. Required vasopressor support again.  Gradually weaned down. Currently on IV Zosyn. White count continues to downtrend. General surgery following.  Diet advanced to regular consistency Acute diarrhea is likely because of  full liquid diet she had for few days.  Imodium PRN started Recent Labs  Lab 12/22/22 0227 12/22/22 1852 12/23/22 0407 12/23/22 1108 12/23/22 1144 12/24/22 0400 12/25/22 0510 12/26/22 0939  WBC 15.4*  --  21.1*  --   --  18.9* 14.8* 10.4  LATICACIDVEN 1.1 0.9  --  1.6 1.1  --   --   --       ESRD-HD-MWF Hypotension Nephrology following Continue midodrine to 15 mg 3 times daily PTA on Lopressor, Norvasc, Imdur.  Currently on hold.  Type 1 diabetes mellitus uncontrolled A1c 8.1 12/20/2022.  Diabetic since age 33 PTA on Lantus, sliding scale insulin Currently only on sliding scale insulin with Accu-Cheks. Diabetes care coordinator consult appreciated. Recent Labs  Lab 12/26/22 0734 12/26/22 1631 12/26/22 2023 12/27/22 0730 12/27/22 1126  GLUCAP 105* 104* 148* 82 92    PAD s/ p prior bl BKA  HLD PTA on Plavix, statin  Chronic microcytic anemia Hemoglobin stable between 8 and 9. Recent Labs    07/25/22 0532 07/26/22 0654 12/22/22 0227 12/23/22 0407 12/24/22 0400 12/25/22 0510 12/26/22 0939  HGB 9.8*   < > 10.0* 9.6* 8.5* 8.8* 8.2*  MCV 85.3   < > 81.1 78.2* 77.2* 77.4* 78.8*  FERRITIN 562*  --   --   --   --   --   --   TIBC 179*  --   --   --   --   --   --   IRON 56  --   --   --   --   --   --    < > = values in this interval not displayed.    Moderate Malnutrition Dietician  is following   Mobility: Bilateral BKA status  Goals of care   Code Status: Full Code     DVT prophylaxis:  heparin injection 5,000 Units Start: 12/21/22 1000 SCDs Start: 12/20/22 1304   Antimicrobials: IV Zosyn Fluid: Not on IV fluid Consultants: Nephrology Family Communication: None at bedside  Status: Inpatient Level of care:  Med-Surg   Patient from: Nursing home Anticipated d/c to: Pending clinical course Needs to continue in-hospital care:  Hopefully back to nursing home in 1 to 2 days.    Diet:  Diet Order             Diet regular Room service  appropriate? Yes; Fluid consistency: Thin  Diet effective now                   Scheduled Meds:  Chlorhexidine Gluconate Cloth  6 each Topical Daily   Chlorhexidine Gluconate Cloth  6 each Topical Q0600   dorzolamide-timolol  1 drop Left Eye BID   feeding supplement  1 Container Oral TID BM   heparin injection (subcutaneous)  5,000 Units Subcutaneous Q8H   insulin aspart  0-5 Units Subcutaneous QHS   insulin aspart  0-9 Units Subcutaneous TID WC   midodrine  10 mg Oral Q8H   multivitamin  1 tablet Oral QHS    PRN meds: acetaminophen, dextrose, docusate sodium, labetalol, loperamide, polyethylene glycol   Infusions:   sodium chloride     sodium chloride      Antimicrobials: Anti-infectives (From admission, onward)    Start     Dose/Rate Route Frequency Ordered Stop   12/24/22 2000  piperacillin-tazobactam (ZOSYN) IVPB 2.25 g        2.25 g 100 mL/hr over 30 Minutes Intravenous Every 8 hours 12/24/22 1536 12/26/22 2359   12/21/22 0800  piperacillin-tazobactam (ZOSYN) IVPB 2.25 g  Status:  Discontinued        2.25 g 100 mL/hr over 30 Minutes Intravenous Every 8 hours 12/21/22 0708 12/24/22 1536   12/20/22 2200  piperacillin-tazobactam (ZOSYN) IVPB 2.25 g  Status:  Discontinued       See Hyperspace for full Linked Orders Report.   2.25 g 100 mL/hr over 30 Minutes Intravenous Every 8 hours 12/20/22 1324 12/21/22 0708   12/20/22 1330  piperacillin-tazobactam (ZOSYN) IVPB 3.375 g       See Hyperspace for full Linked Orders Report.   3.375 g 100 mL/hr over 30 Minutes Intravenous  Once 12/20/22 1324 12/20/22 1409       Nutritional status:  Body mass index is 23.08 kg/m.  Nutrition Problem: Moderate Malnutrition Etiology: chronic illness (ESRD/HD) Signs/Symptoms: mild muscle depletion, percent weight loss (18.3% weight loss in < 6 months) Percent weight loss: 18.3 %     Objective: Vitals:   12/27/22 0809 12/27/22 1509  BP: (!) 178/83 (!) 99/37  Pulse: 79 84   Resp: 18 18  Temp: 98.8 F (37.1 C) 99 F (37.2 C)  SpO2: 100% 100%    Intake/Output Summary (Last 24 hours) at 12/27/2022 1530 Last data filed at 12/27/2022 0925 Gross per 24 hour  Intake 611.01 ml  Output 0 ml  Net 611.01 ml    Filed Weights   12/25/22 0350 12/26/22 0830 12/27/22 0738  Weight: 38.8 kg 57.9 kg 55.4 kg   Weight change:  Body mass index is 23.08 kg/m.   Physical Exam: General exam: Pleasant unfortunate young African-American female Skin: No rashes, lesions or ulcers. HEENT: Atraumatic, normocephalic, no obvious bleeding Lungs: Clear to  auscultate bilaterally CVS: Regular rate and rhythm, no murmur GI/Abd soft, mild diffuse tenderness present, bowel sound present CNS: Alert, awake, oriented x 3.  Visually impaired at baseline Psychiatry: Sad affect Extremities: Bilateral BKA status  Data Review: I have personally reviewed the laboratory data and studies available.  F/u labs ordered Unresulted Labs (From admission, onward)    None       Total time spent in review of labs and imaging, patient evaluation, formulation of plan, documentation and communication with family: 45 minutes  Signed, Lorin Glass, MD [facility] 12/27/2022

## 2022-12-27 NOTE — Care Management Important Message (Signed)
Important Message  Patient Details  Name: [name] MRN: [id number] Date of Birth: April 14, 1981   Medicare Important Message Given:  Yes     Glenard Keesling 12/27/2022, 2:25 PM

## 2022-12-28 DIAGNOSIS — K559 Vascular disorder of intestine, unspecified: Secondary | ICD-10-CM | POA: Diagnosis not present

## 2022-12-28 LAB — RENAL FUNCTION PANEL
Albumin: 1.9 g/dL — ABNORMAL LOW (ref 3.5–5.0)
Anion gap: 12 (ref 5–15)
BUN: 22 mg/dL — ABNORMAL HIGH (ref 6–20)
CO2: 25 mmol/L (ref 22–32)
Calcium: 8.1 mg/dL — ABNORMAL LOW (ref 8.9–10.3)
Chloride: 101 mmol/L (ref 98–111)
Creatinine, Ser: 5.13 mg/dL — ABNORMAL HIGH (ref 0.44–1.00)
GFR, Estimated: 10 mL/min — ABNORMAL LOW (ref >60)
Glucose, Bld: 185 mg/dL — ABNORMAL HIGH (ref 70–99)
Phosphorus: 5 mg/dL — ABNORMAL HIGH (ref 2.5–4.6)
Potassium: 3.2 mmol/L — ABNORMAL LOW (ref 3.5–5.1)
Sodium: 138 mmol/L (ref 135–145)

## 2022-12-28 LAB — GLUCOSE, CAPILLARY
Glucose-Capillary: 188 mg/dL — ABNORMAL HIGH (ref 70–99)
Glucose-Capillary: 117 mg/dL — ABNORMAL HIGH (ref 70–99)
Glucose-Capillary: 210 mg/dL — ABNORMAL HIGH (ref 70–99)

## 2022-12-28 LAB — CBC
HCT: 24 % — ABNORMAL LOW (ref 36.0–46.0)
Hemoglobin: 7.5 g/dL — ABNORMAL LOW (ref 12.0–15.0)
MCH: 25.5 pg — ABNORMAL LOW (ref 26.0–34.0)
MCHC: 31.3 g/dL (ref 30.0–36.0)
MCV: 81.6 fL (ref 80.0–100.0)
Platelets: 326 10*3/uL (ref 150–400)
RBC: 2.94 MIL/uL — ABNORMAL LOW (ref 3.87–5.11)
RDW: 21 % — ABNORMAL HIGH (ref 11.5–15.5)
WBC: 8.4 10*3/uL (ref 4.0–10.5)
nRBC: 0 % (ref 0.0–0.2)

## 2022-12-28 LAB — CULTURE, BLOOD (ROUTINE X 2)

## 2022-12-28 MED ORDER — INSULIN GLARGINE-YFGN 100 UNIT/ML ~~LOC~~ SOLN
10.0000 [IU] | Freq: Every day | SUBCUTANEOUS | Status: DC
  Administered 2022-12-28: 10 [IU] via SUBCUTANEOUS
  Filled 2022-12-28: qty 0.1

## 2022-12-28 MED ORDER — INSULIN GLARGINE 100 UNIT/ML ~~LOC~~ SOLN: 10.0000 [IU] | Freq: Every day | SUBCUTANEOUS | 11 refills | Status: DC

## 2022-12-28 NOTE — Plan of Care (Signed)

## 2022-12-28 NOTE — TOC Transition Note (Signed)
Transition of Care [facility]) - CM/SW Discharge Note   Patient Details  Name: [name] MRN: [id number] Date of Birth: 07/22/1981  Transition of Care [facility]) CM/SW Contact:  Ralene Bathe, LCSWA Phone Number: 12/28/2022, 12:48 PM   Clinical Narrative:    Patient will [facility] to: [facility] Anticipated DC date: 12/28/2022 Transport by: Sharin Mons   Per MD patient ready for DC to SNF. RN to call report prior to discharge [phone] room 116B). RN, patient, patient's family, and facility notified of DC. Discharge Summary sent to facility. DC packet on chart. Ambulance transport scheduled for 1500.  Facility notified that patient is to resume HD at home dialysis clinic on Monday 12/31/2022.  CSW will sign off for now as social work intervention is no longer needed. Please consult [place] again if new needs arise.    Final next level of care: Skilled Nursing Facility Barriers to Discharge: No Barriers Identified   Patient Goals and CMS Choice CMS Medicare.gov Compare Post Acute Care list provided to:: Patient Choice offered to / list presented to : Patient  Discharge Placement                Patient chooses bed at:  [facility]) Patient to be transferred to facility by: [facility] Name of family member notified: patient alert and oriented Patient and family notified of of transfer: 12/28/22  Discharge Plan and Services Additional resources added to the After Visit Summary for                                       Social Determinants of Health (SDOH) Interventions SDOH Screenings   Food Insecurity: No Food Insecurity (12/20/2022)  Housing: Low Risk  (12/20/2022)  Transportation Needs: No Transportation Needs (12/20/2022)  Utilities: Not At Risk (12/20/2022)  Depression (PHQ2-9): Low Risk  (05/11/2020)  Tobacco Use: Low Risk  (12/20/2022)     Readmission Risk Interventions    07/04/2022   10:49 AM 08/23/2021    3:42 PM  Readmission Risk Prevention Plan  Transportation Screening  Complete Complete  HRI or Home Care Consult  Complete  Social Work Consult for Recovery Care Planning/Counseling  Complete  Palliative Care Screening  Not Applicable  Medication Review Oceanographer) Complete Complete  PCP or Specialist appointment within 3-5 days of discharge Complete   HRI or Home Care Consult Complete   SW Recovery Care/Counseling Consult Complete   Palliative Care Screening Not Applicable   Skilled Nursing Facility Complete

## 2022-12-28 NOTE — Discharge Summary (Signed)
Physician Discharge Summary  Miata Wixson ZHY:[id number] DOB: [birth year] DOA: 12/20/2022  PCP: [facility]  Admit date: 12/20/2022 Discharge date: 12/28/2022  Admitted From: Long-term nursing facility Discharge disposition: Back to long-term nursing facility   Brief narrative: [name] is a 42 y.o. female with PMH significant for ESRD HD MWF, DM1 since age 36, HTN, CVA, h/o bilateral AKA, chronic anemia who is a long-term nursing home resident 5/9, patient was brought to the ED with complaint of abdominal pain for 1 day. CT on admission showed pneumatosis of transverse and proximal descending colon with associated mesenteric and portal venous gas, 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  Per patient, pain appears to correlate with HD sessions Initial labs also showed elevated lactic acid Patient also had marked hyperglycemic requiring insulin drip Because of complexity of the case, he was admitted to ICU.  Required to be on pressors briefly. General surgery consultation was obtained.  She was conservatively managed.  Pain gradually improved. 5/11 repeat CTA abdomen pelvis showed images consistent with acute concern of ischemia to the level of the splenic fissure See below for details.  Subjective: Patient was seen and examined this morning in dialysis unit.  Not in distress.  No new symptoms. Blood pressure running elevated.  After discussion with nephrology, midodrine dose was reduced.  Assessment and plan: Septic shock in the setting of acute mesenteric ischemia Initially admitted to ICU on 5/9 for acute mesenteric ischemia and required pressors briefly for hypotension.   Patient started having fever and hence CT angio abdomen was repeated on 5/11 which showed decreasing stenosis but persistent pneumatosis of transverse colon and splenic flexure. Required vasopressor support again.  Gradually weaned down. She completed 1 week  course of IV Zosyn. White count continues to downtrend. General surgery followed.  Diet gradually advanced to regular consistency which he has been able to tolerate. Recent Labs  Lab 12/22/22 0227 12/22/22 1852 12/23/22 0407 12/23/22 1108 12/23/22 1144 12/24/22 0400 12/25/22 0510 12/26/22 0939 12/28/22 0745  WBC 15.4*  --  21.1*  --   --  18.9* 14.8* 10.4 8.4  LATICACIDVEN 1.1 0.9  --  1.6 1.1  --   --   --   --      ESRD-HD-MWF Hypotension Nephrology following For chronic hypotension, patient used to be on midodrine 5 mg 3 times daily.  Because of septic shock, it was increased to 50 mg 3 times daily.  Blood pressure running elevated.  Gradually taper down to home dose of 5 mg 3 times daily at discharge. PTA, she was also on Lopressor, Norvasc, Imdur.  Currently all of these meds are on hold.  Blood pressure running elevated.  At discharge, resume Lopressor and Imdur.  Can keep Norvasc on hold.  To be reassessed in the outpatient dialysis setting and medicines readjusted.  Type 1 diabetes mellitus uncontrolled A1c 8.1 12/20/2022.  Diabetic since age 42 PTA on Lantus 30 units daily, sliding scale insulin Blood sugar level rising up, I would resume Lantus at 10 units daily at discharge today.  Continue sliding scale insulin. Recent Labs  Lab 12/27/22 0730 12/27/22 1126 12/27/22 1612 12/27/22 2119 12/28/22 0721  GLUCAP 82 92 173* 152* 188*   History of CVA  PAD s/ p prior bl AKA  HLD PTA on aspirin, statin Continue the same  Chronic microcytic anemia Hemoglobin stable.  Intermittent use of ESA with dialysis. Recent Labs    07/25/22 0532 07/26/22 0654 12/23/22 0407  12/24/22 0400 12/25/22 0510 12/26/22 0939 12/28/22 0745  HGB 9.8*   < > 9.6* 8.5* 8.8* 8.2* 7.5*  MCV 85.3   < > 78.2* 77.2* 77.4* 78.8* 81.6  FERRITIN 562*  --   --   --   --   --   --   TIBC 179*  --   --   --   --   --   --   IRON 56  --   --   --   --   --   --    < > = values in this interval not  displayed.   Moderate Malnutrition Dietician consult appreciated  Mobility: Bilateral AKA status  Goals of care   Code Status: Full Code   Wounds:  - Wound / Incision (Open or Dehisced) 02/10/22 Other (Comment) Buttocks Left PU stage 2. 4 cm (Active)  Date First Assessed/Time First Assessed: 02/10/22 0500   Wound Type: (c) Other (Comment)  Location: Buttocks  Location Orientation: Left  Wound Description (Comments): PU stage 2. 4 cm  Present on Admission: Yes    No assessment data to display     No associated orders.     Pressure Injury 02/10/22 Sacrum Left Stage 2 -  Partial thickness loss of dermis presenting as a shallow open injury with a red, pink wound bed without slough. (Active)  Date First Assessed/Time First Assessed: 02/10/22 0500   Location: Sacrum  Location Orientation: Left  Staging: Stage 2 -  Partial thickness loss of dermis presenting as a shallow open injury with a red, pink wound bed without slough.  Present on Admi...    Assessments 02/10/2022  5:00 AM 07/27/2022  9:00 PM  Dressing Type Foam - Lift dressing to assess site every shift Foam - Lift dressing to assess site every shift  Dressing Clean, Dry, Intact;Changed Clean, Dry, Intact  Site / Wound Assessment -- Dressing in place / Unable to assess  Drainage Amount -- None  Drainage Description Serosanguineous --     No associated orders.     Wound / Incision (Open or Dehisced) 12/20/22 Skin tear Groin Anterior;Proximal;Right 4x 2 (Active)  Date First Assessed/Time First Assessed: 12/20/22 1600   Wound Type: Skin tear  Location: Groin  Location Orientation: Anterior;Proximal;Right  Wound Description (Comments): 4x 2  Present on Admission: Yes    Assessments 12/21/2022  8:00 PM 12/27/2022 11:32 PM  Dressing Type None Foam - Lift dressing to assess site every shift  Treatment Other (Comment) --     No associated orders.    Discharge Exam:   Vitals:   12/28/22 0850 12/28/22 0920 12/28/22 0950 12/28/22 1020   BP: (!) 186/78 (!) 176/80 (!) 156/78 129/75  Pulse: 73 74 79 82  Resp: 13 (!) 9 14 14   Temp:      TempSrc:      SpO2: 100% 100% 100% 100%  Weight:        Body mass index is 23.83 kg/m.  General exam: Pleasant unfortunate young African-American female Skin: No rashes, lesions or ulcers. HEENT: Atraumatic, normocephalic, no obvious bleeding Lungs: Clear to auscultate bilaterally CVS: Regular rate and rhythm, no murmur GI/Abd soft, mild diffuse tenderness present, bowel sound present CNS: Alert, awake, oriented x 3.  Visually impaired at baseline Psychiatry: Sad affect Extremities: Bilateral AKA status  Follow ups:    Follow-up Information     [facility] Follow up.   Specialty: Pediatrics Contact information: [address]  [place] [address] [phone]                 Discharge Instructions:   Discharge Instructions     Call MD for:  difficulty breathing, headache or visual disturbances   Complete by: As directed    Call MD for:  extreme fatigue   Complete by: As directed    Call MD for:  hives   Complete by: As directed    Call MD for:  persistant dizziness or light-headedness   Complete by: As directed    Call MD for:  persistant nausea and vomiting   Complete by: As directed    Call MD for:  severe uncontrolled pain   Complete by: As directed    Call MD for:  temperature >100.4   Complete by: As directed    Diet Carb Modified   Complete by: As directed    Renal diet   Discharge instructions   Complete by: As directed    Discharge instructions for diabetes mellitus: Check blood sugar 3 times a day and bedtime at home. If blood sugar running above 200 or less than 70 please call your MD to adjust insulin. If you notice signs and symptoms of hypoglycemia (low blood sugar) like jitteriness, confusion, thirst, tremor and sweating, please check blood sugar, drink sugary drink/biscuits/sweets to increase sugar level  and call MD or return to ER.    General discharge instructions: Follow with Primary MD [facility] in 7 days  Please request your PCP  to go over your hospital tests, procedures, radiology results at the follow up. Please get your medicines reviewed and adjusted.  Your PCP may decide to repeat certain labs or tests as needed. Do not drive, operate heavy machinery, perform activities at heights, swimming or participation in water activities or provide baby sitting services if your were admitted for syncope or siezures until you have seen by Primary MD or a Neurologist and advised to do so again. [place] Controlled Substance Reporting System database was reviewed. Do not drive, operate heavy machinery, perform activities at heights, swim, participate in water activities or provide baby-sitting services while on medications for pain, sleep and mood until your outpatient physician has reevaluated you and advised to do so again.  You are strongly recommended to comply with the dose, frequency and duration of prescribed medications. Activity: As tolerated with Full fall precautions use walker/cane & assistance as needed Avoid using any recreational substances like cigarette, tobacco, alcohol, or non-prescribed drug. If you experience worsening of your admission symptoms, develop shortness of breath, life threatening emergency, suicidal or homicidal thoughts you must seek medical attention immediately by calling 911 or calling your MD immediately  if symptoms less severe. You must read complete instructions/literature along with all the possible adverse reactions/side effects for all the medicines you take and that have been prescribed to you. Take any new medicine only after you have completely understood and accepted all the possible adverse reactions/side effects.  Wear Seat belts while driving. You were cared for by a hospitalist during your hospital stay. If you have  any questions about your discharge medications or the care you received while you were in the hospital after you are discharged, you can call the unit and ask to speak with the hospitalist or the covering physician. Once you are discharged, your primary care physician will handle any further medical issues. Please note that NO REFILLS for any discharge medications will be authorized once  you are discharged, as it is imperative that you return to your primary care physician (or establish a relationship with a primary care physician if you do not have one).   Discharge wound care:   Complete by: As directed    Increase activity slowly   Complete by: As directed        Discharge Medications:   Allergies as of 12/28/2022       Reactions   Morphine Rash, Other (See Comments)   Peanut-containing Drug Products Anaphylaxis, Hives   Chocolate Hives   Shellfish Allergy Swelling        Medication List     STOP taking these medications    amLODipine 2.5 MG tablet Commonly known as: NORVASC   clopidogrel 75 MG tablet Commonly known as: PLAVIX   Insulin Pen Needle 32G X 4 MM Misc       TAKE these medications    Accu-Chek FastClix Lancets Misc Use as instructed to check blood sugar up to TID. E11.22 What changed:  how much to take how to take this when to take this   Accu-Chek Guide Me w/Device Kit 1 kit by Does not apply route 3 (three) times daily. Use to check BG at home up to 3 times daily. E11.22   Accu-Chek Guide test strip Generic drug: glucose blood Use as instructed to check blood sugar up to TID. E11.22 What changed:  how much to take how to take this when to take this   acetaminophen 325 MG tablet Commonly known as: TYLENOL Take 650 mg by mouth every 6 (six) hours as needed for fever.   aspirin EC 81 MG tablet Take 1 tablet (81 mg total) by mouth daily. Swallow whole.   atorvastatin 40 MG tablet Commonly known as: LIPITOR Place 1 tablet (40 mg total) into  feeding tube daily.   azelastine 0.05 % ophthalmic solution Commonly known as: OPTIVAR Place 1 drop into both eyes 2 (two) times daily as needed (allergies).   cetirizine 10 MG tablet Commonly known as: ZYRTEC Take 10 mg by mouth daily.   cinacalcet 90 MG tablet Commonly known as: SENSIPAR Take 90 mg by mouth daily.   Dexcom G6 Transmitter Misc 1 Device by Does not apply route as directed.   docusate sodium 100 MG capsule Commonly known as: COLACE Take 100 mg by mouth at bedtime.   dorzolamide-timolol 2-0.5 % ophthalmic solution Commonly known as: COSOPT Place 1 drop into the left eye 2 (two) times daily.   Glucagon Emergency 1 MG Kit Inject 1 mg into the muscle once. If BS drops below 60   insulin aspart 100 UNIT/ML injection Commonly known as: novoLOG Inject 0-6 Units into the skin every 4 (four) hours. What changed:  how much to take when to take this additional instructions   insulin glargine 100 UNIT/ML injection Commonly known as: Semglee Inject 0.1 mLs (10 Units total) into the skin daily at 6 PM. What changed: how much to take   isosorbide mononitrate 30 MG 24 hr tablet Commonly known as: IMDUR Take 1 tablet (30 mg total) by mouth daily.   melatonin 3 MG Tabs tablet Take 3 mg by mouth at bedtime.   metoprolol tartrate 25 MG tablet Commonly known as: LOPRESSOR Take 0.5 tablets (12.5 mg total) by mouth 2 (two) times daily. What changed:  how much to take additional instructions   midodrine 5 MG tablet Commonly known as: PROAMATINE Take 1 tablet (5 mg total) by mouth 3 (three) times daily  as needed (low blood pressure).   multivitamin with minerals Tabs tablet Take 1 tablet by mouth daily.   Nutritional Drink Liqd Take 120 mLs by mouth daily.   nystatin 100000 UNIT/ML suspension Commonly known as: MYCOSTATIN Take 5 mLs (500,000 Units total) by mouth 4 (four) times daily.   ondansetron 4 MG tablet Commonly known as: ZOFRAN Take 4 mg by mouth  every 6 (six) hours as needed for nausea/vomiting.   oxyCODONE-acetaminophen 5-325 MG tablet Commonly known as: Percocet Take 1 tablet by mouth every 8 (eight) hours as needed for severe pain.   polyethylene glycol 17 g packet Commonly known as: MIRALAX / GLYCOLAX Take 17 g by mouth daily at 6 PM.   sodium chloride flush 0.9 % Soln Commonly known as: NS Inject 10 mLs into the vein at bedtime.   thiamine 100 MG tablet Commonly known as: VITAMIN B1 Place 1 tablet (100 mg total) into feeding tube daily.               Discharge Care Instructions  (From admission, onward)           Start     Ordered   12/28/22 0000  Discharge wound care:        12/28/22 1110             The results of significant diagnostics from this hospitalization (including imaging, microbiology, ancillary and laboratory) are listed below for reference.    Procedures and Diagnostic Studies:   CT ABDOMEN PELVIS W CONTRAST  Result Date: 12/20/2022 CLINICAL DATA:  Abdominal pain EXAM: CT ABDOMEN AND PELVIS WITH CONTRAST TECHNIQUE: Multidetector CT imaging of the abdomen and pelvis was performed using the standard protocol following bolus administration of intravenous contrast. RADIATION DOSE REDUCTION: This exam was performed according to the departmental dose-optimization program which includes automated exposure control, adjustment of the mA and/or kV according to patient size and/or use of iterative reconstruction technique. CONTRAST:  75mL OMNIPAQUE IOHEXOL 350 MG/ML SOLN COMPARISON:  CT abdomen and pelvis dated February 09, 2022 FINDINGS: Lower chest: Cardiomegaly. Linear opacity of the right lung base at site of previously seen consolidation, consistent with scarring. Hepatobiliary: Portal venous gas. Biliary ductal dilation, unchanged when compared with the prior exam. Gallstones with no evidence of gallbladder wall thickening. Pancreas: Atrophic. No pancreatic ductal dilatation or surrounding  inflammatory changes. Spleen: Normal in size without focal abnormality. Adrenals/Urinary Tract: Atrophic bilateral kidneys. No hydronephrosis. Bladder wall thickening. Stomach/Bowel: Wall thickening and pneumatosis of the transverse and proximal descending colon. Gas seen in the mesenteric veins adjacent to the transverse colon. No evidence of obstruction. Normal appendix. Vascular/Lymphatic: Severe aortic atherosclerosis, including severe atherosclerotic disease of the distal branch vessels of the SMA and celiac arteries. No enlarged abdominal or pelvic lymph nodes. Reproductive: Uterus and bilateral adnexa are unremarkable. Other: No abdominal wall hernia or abnormality. No abdominopelvic ascites. Musculoskeletal: No acute or significant osseous findings. IMPRESSION: 1. Wall thickening and pneumatosis of the transverse and proximal descending colon with associated mesenteric venous and portal venous gas, findings are highly concerning for bowel ischemia. 2. Severe aortic atherosclerosis (ICD10-I70.0), including severe atherosclerotic disease of the distal branch vessels of the SMA and celiac arteries. 3. Bladder wall thickening, findings can be seen in the setting of cystitis. Correlate with urinalysis. Critical Value/emergent results were called by telephone at the time of interpretation on 12/20/2022 at 11:34 am to [facility] , who verbally acknowledged these results. Electronically Signed   By: Aliene Altes.D.  On: 12/20/2022 11:38   DG Chest Port 1 View  Result Date: 12/20/2022 CLINICAL DATA:  Abdominal pain EXAM: PORTABLE CHEST 1 VIEW COMPARISON:  CXR 07/23/22 FINDINGS: No pleural effusion. No pneumothorax. No focal airspace opacity. No radiographically apparent displaced rib fractures. Visualized upper abdomen is unremarkable. Normal cardiac and mediastinal contours. IMPRESSION: No focal airspace opacity Electronically Signed   By: Lorenza Cambridge M.D.   On: 12/20/2022 10:15     Labs:    Basic Metabolic Panel: Recent Labs  Lab 12/23/22 0407 12/24/22 0400 12/25/22 0510 12/26/22 0900 12/28/22 0745  NA 137 138 136 138 138  K 4.1 4.2 3.7 3.6 3.2*  CL 97* 98 94* 96* 101  CO2 24 24 25 26 25   GLUCOSE 119* 182* 172* 115* 185*  BUN 45* 58* 26* 34* 22*  CREATININE 6.13* 7.08* 4.14* 5.42* 5.13*  CALCIUM 8.7* 8.6* 9.0 8.6* 8.1*  MG  --   --  1.5*  --   --   PHOS  --   --   --  5.2* 5.0*   GFR Estimated Creatinine Clearance: 10.9 mL/min (A) (by C-G formula based on SCr of 5.13 mg/dL (H)). Liver Function Tests: Recent Labs  Lab 12/26/22 0900 12/28/22 0745  ALBUMIN 1.9* 1.9*   No results for input(s): "LIPASE", "AMYLASE" in the last 168 hours. No results for input(s): "AMMONIA" in the last 168 hours. Coagulation profile No results for input(s): "INR", "PROTIME" in the last 168 hours.  CBC: Recent Labs  Lab 12/23/22 0407 12/24/22 0400 12/25/22 0510 12/26/22 0939 12/28/22 0745  WBC 21.1* 18.9* 14.8* 10.4 8.4  HGB 9.6* 8.5* 8.8* 8.2* 7.5*  HCT 30.2* 26.1* 27.0* 24.9* 24.0*  MCV 78.2* 77.2* 77.4* 78.8* 81.6  PLT 232 239 242 264 326   Cardiac Enzymes: No results for input(s): "CKTOTAL", "CKMB", "CKMBINDEX", "TROPONINI" in the last 168 hours. BNP: Invalid input(s): "POCBNP" CBG: Recent Labs  Lab 12/27/22 0730 12/27/22 1126 12/27/22 1612 12/27/22 2119 12/28/22 0721  GLUCAP 82 92 173* 152* 188*   D-Dimer No results for input(s): "DDIMER" in the last 72 hours. Hgb A1c No results for input(s): "HGBA1C" in the last 72 hours. Lipid Profile No results for input(s): "CHOL", "HDL", "LDLCALC", "TRIG", "CHOLHDL", "LDLDIRECT" in the last 72 hours. Thyroid function studies No results for input(s): "TSH", "T4TOTAL", "T3FREE", "THYROIDAB" in the last 72 hours.  Invalid input(s): "FREET3" Anemia work up No results for input(s): "VITAMINB12", "FOLATE", "FERRITIN", "TIBC", "IRON", "RETICCTPCT" in the last 72 hours. Microbiology Recent Results (from the past 240  hour(s))  Culture, blood (Routine X 2) w Reflex to ID Panel     Status: None   Collection Time: 12/23/22 10:50 AM   Specimen: BLOOD LEFT HAND  Result Value Ref Range Status   Specimen Description BLOOD LEFT HAND  Final   Special Requests   Final    BOTTLES DRAWN AEROBIC ONLY Blood Culture results may not be optimal due to an inadequate volume of blood received in culture bottles   Culture   Final    NO GROWTH 5 DAYS Performed at [facility], [address] [place], [place] [address]    Report Status 12/28/2022 FINAL  Final  Culture, blood (Routine X 2) w Reflex to ID Panel     Status: None   Collection Time: 12/23/22 11:08 AM   Specimen: BLOOD LEFT HAND  Result Value Ref Range Status   Specimen Description BLOOD LEFT HAND  Final   Special Requests   Final  BOTTLES DRAWN AEROBIC ONLY Blood Culture results may not be optimal due to an inadequate volume of blood received in culture bottles   Culture   Final    NO GROWTH 5 DAYS Performed at [facility], [address] [place], [place] [address]    Report Status 12/28/2022 FINAL  Final    Time coordinating discharge: 45 minutes  Signed: Nyeisha Goodall  [facility] 12/28/2022, 11:10 AM

## 2022-12-28 NOTE — Progress Notes (Signed)
D/C order noted. Contacted [facility] to advise clinic of pt's d/c today and that pt should resume care on Monday.   Olivia Canter Renal Navigator [phone]

## 2022-12-28 NOTE — Progress Notes (Signed)
  [facility] Progress Note   Subjective:  Seen in room.   Objective Vitals:   12/24/22 1315 12/24/22 1330 12/24/22 1345 12/24/22 1400  BP: (!) 145/56 (!) 149/22 (!) 151/33 (!) 139/43  Pulse: (!) 111 (!) 110 (!) 109 (!) 106  Resp: (!) 21 (!) 25 (!) 23 11  Temp:      TempSrc:      SpO2: 91% 92% 91% 90%  Weight:       Physical Exam General: Chronically ill woman, A&O x 3 Heart: RRR; 2/6 murmur Lungs: CTA anteriorly Abdomen: mild abd tenderness, no rebound Extremities: B AKA, no edema Dialysis Access: RUE AVF + bruit  OP HD: [facility]WF [facility] 3h 45 min    49.5kg  2K/2Ca bath   AVF    P#2   Heparin none - last HD 5/8 - Mircera IV q 2 weeks - last 5/3 - Venofer 50mg  IV weekly   Assessment/Plan: Bowel ischemia - per CT 5/9. Surgery following. Fevers and WBC improving. Pt states she is feeling better. Per gen surg can be dc'd soon if diet advancing.   HyPOtension-  weaned off levo gtt, had ^'d midodrine from home 5mg  tid to 15mg  tid here. Lowered 10mg  tid yesterday, prob could be lowered again to 5mg  tid today.  Volume - no edema on exam, wt's are up and down. UF 2-3 L as tolerated w/ next HD.  ESRD - usual HD is MWF.  HD today in progress.  Anemia: Hgb 9s --> 8's now. Transfuse prn. Due for esa 5/17 if still here.  Metabolic bone disease: Ca and phos in range. No binder listed. Follow.  T2DM H/o CVA H/o bilat LE amputee Dispo - possibly dc'ing today back to SNF. Ok for dc from renal standpoint, have d/w pmd.    Vinson Moselle, MD 12/28/2022, 1:01 PM  Recent Labs  Lab 12/26/22 0900 12/26/22 0939 12/28/22 0745  HGB  --  8.2* 7.5*  ALBUMIN 1.9*  --  1.9*  CALCIUM 8.6*  --  8.1*  PHOS 5.2*  --  5.0*  CREATININE 5.42*  --  5.13*  K 3.6  --  3.2*     Inpatient medications:  Chlorhexidine Gluconate Cloth  6 each Topical Daily   Chlorhexidine Gluconate Cloth  6 each Topical Q0600   Chlorhexidine Gluconate Cloth  6 each Topical Q0600   dorzolamide-timolol  1  drop Left Eye BID   feeding supplement  1 Container Oral TID BM   heparin injection (subcutaneous)  5,000 Units Subcutaneous Q8H   insulin aspart  0-5 Units Subcutaneous QHS   insulin aspart  0-9 Units Subcutaneous TID WC   insulin glargine-yfgn  10 Units Subcutaneous Daily   midodrine  10 mg Oral Q8H   multivitamin  1 tablet Oral QHS    sodium chloride     sodium chloride     acetaminophen, dextrose, docusate sodium, labetalol, loperamide, polyethylene glycol

## 2022-12-28 NOTE — Progress Notes (Signed)
Trauma/Critical Care Follow Up Note  Subjective:    seen in HD. Just had a loose bowel movement and has been having them without blood. Tolerating regular diet without n/v/abdominal pain  Objective:  Vital signs for last 24 hours: Temp:  [97.9 F (36.6 C)-99 F (37.2 C)] 97.9 F (36.6 C) (05/17 0749) Pulse Rate:  [70-84] 79 (05/17 0950) Resp:  [9-19] 14 (05/17 0950) BP: (99-186)/(37-84) 156/78 (05/17 0950) SpO2:  [100 %] 100 % (05/17 0950) Weight:  [54.2 kg-57.5 kg] 57.2 kg (05/17 0741)  Hemodynamic parameters for last 24 hours:    Intake/Output from previous day: 05/16 0701 - 05/17 0700 In: 940 [P.O.:940] Out: 1 [Stool:1]  Intake/Output this shift: No intake/output data recorded.  Vent settings for last 24 hours:    Physical Exam:  Gen: comfortable, no distress Neuro: follows commands, alert, communicative Neck: supple Pulm: unlabored breathing on RA Abd: soft, NT, ND Extr: wwp, no edema  Results for orders placed or performed during the hospital encounter of 12/20/22 (from the past 24 hour(s))  Glucose, capillary     Status: None   Collection Time: 12/27/22 11:26 AM  Result Value Ref Range   Glucose-Capillary 92 70 - 99 mg/dL  Glucose, capillary     Status: Abnormal   Collection Time: 12/27/22  4:12 PM  Result Value Ref Range   Glucose-Capillary 173 (H) 70 - 99 mg/dL  Glucose, capillary     Status: Abnormal   Collection Time: 12/27/22  9:19 PM  Result Value Ref Range   Glucose-Capillary 152 (H) 70 - 99 mg/dL  Glucose, capillary     Status: Abnormal   Collection Time: 12/28/22  7:21 AM  Result Value Ref Range   Glucose-Capillary 188 (H) 70 - 99 mg/dL  Renal function panel     Status: Abnormal   Collection Time: 12/28/22  7:45 AM  Result Value Ref Range   Sodium 138 135 - 145 mmol/L   Potassium 3.2 (L) 3.5 - 5.1 mmol/L   Chloride 101 98 - 111 mmol/L   CO2 25 22 - 32 mmol/L   Glucose, Bld 185 (H) 70 - 99 mg/dL   BUN 22 (H) 6 - 20 mg/dL   Creatinine,  Ser 9.56 (H) 0.44 - 1.00 mg/dL   Calcium 8.1 (L) 8.9 - 10.3 mg/dL   Phosphorus 5.0 (H) 2.5 - 4.6 mg/dL   Albumin 1.9 (L) 3.5 - 5.0 g/dL   GFR, Estimated 10 (L) >60 mL/min   Anion gap 12 5 - 15  CBC     Status: Abnormal   Collection Time: 12/28/22  7:45 AM  Result Value Ref Range   WBC 8.4 4.0 - 10.5 K/uL   RBC 2.94 (L) 3.87 - 5.11 MIL/uL   Hemoglobin 7.5 (L) 12.0 - 15.0 g/dL   HCT 21.3 (L) 08.6 - 57.8 %   MCV 81.6 80.0 - 100.0 fL   MCH 25.5 (L) 26.0 - 34.0 pg   MCHC 31.3 30.0 - 36.0 g/dL   RDW 46.9 (H) 62.9 - 52.8 %   Platelets 326 150 - 400 K/uL   nRBC 0.0 0.0 - 0.2 %    Assessment & Plan:  Present on Admission:  Ischemic bowel disease (HCC)  Malnutrition (HCC)  Malnutrition of moderate degree    LOS: 8 days    Possible mesenteric ischemia  - CT 5/9 with pneumatosis of transverse and proximal descending colon with associated mesenteric and portal venous gas; 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 Patient  is s/p dialysis 5/8, pain began after this.  - CTA 5/11 decreased level of colonic pneumatosis although persistent pneumatosis involving the transverse colon and splenic flexure, resolution of mesenteric venous gas, occlusion or substantial stenosis in the dominant mesenteric arterial anatomy.  Dr. Fredricka Bonine reviewed these images and felt the vascular disease is more peripheral. - continues to improve and tolerating reg without abdominal pain.afebrile, WBC normal. Now off abx - okay to dc from surgical standpoint   FEN: reg, IVF per primary  ID: Zosyn >5/15 VTE: heparin subq   - below per TRH/CCM -  T2DM with hyperglycemia - improved, off insulin gtt ESRD on HD Hx of STEMI BL AKA HTN Hx of CVA on asa and plavix per chart review  Anticipate readiness for discharge 5/17 from surgical standpoint.   Eric Form, PA-C [facility] Surgery 12/28/2022, 10:14 AM Please see Amion for pager number during day hours  7:00am-4:30pm   12/28/2022  *Care during the described time interval was provided by me. I have reviewed this patient's available data, including medical history, events of note, physical examination and test results as part of my evaluation.

## 2022-12-28 NOTE — Progress Notes (Signed)
Report called to [facility].

## 2022-12-29 IMAGING — DX DG CHEST 1V PORT
1 series · 1 of 1 positions shown · non-contrast
Comparison: Chest x-ray 09/22/2021.

CLINICAL DATA: 40-year-old female status post removal of vascular
catheter. Evaluate for pneumothorax.

EXAM:
PORTABLE CHEST 1 VIEW

[chest]
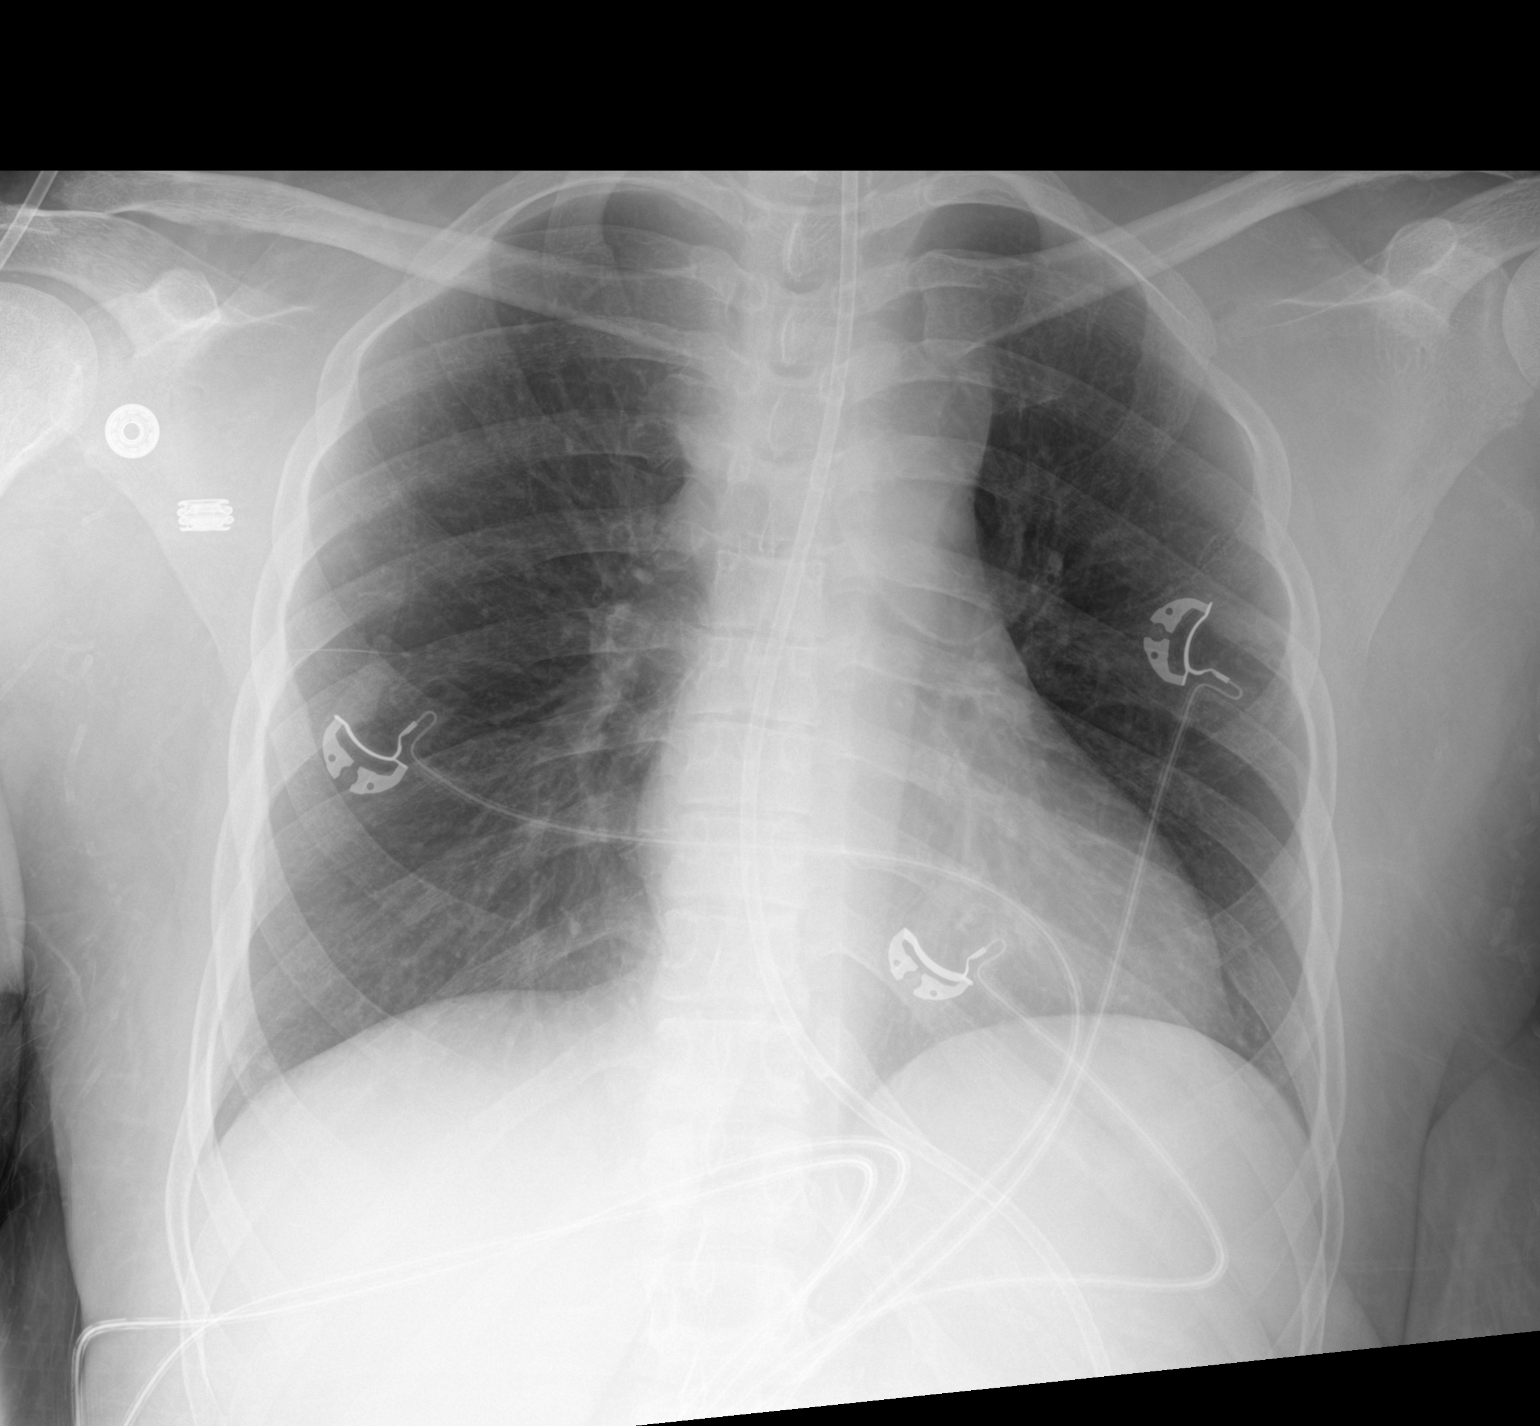

[1 of 1 positions shown; findings below may reference images not displayed]

FINDINGS: A feeding tube is seen extending into the abdomen, however, the tip
of the feeding tube extends below the lower margin of the image.
Lung volumes are normal. No consolidative airspace disease. No
pleural effusions. No pneumothorax. No pulmonary nodule or mass
noted. Pulmonary vasculature and the cardiomediastinal silhouette
are within normal limits.
IMPRESSION: 1. No pneumothorax. No radiographic evidence of acute
cardiopulmonary disease.

## 2022-12-29 NOTE — Hospital Course (Signed)
[facility] Patient Discharge Orders- [facility]IC: [id number]  Patient's name: [name] Admit/DC Dates: 12/20/2022 - 12/28/2022  Discharge Diagnoses: Septic shock in the setting of acute mesenteric ischemia  /required pressors briefly for hypotension.  completed 1 week course of IV Zosyn. .General surgery followed.  HyPOtension-  had ^'d midodrine from home 5mg  tid to 15mg  tid here. Lowered 10mg  tid ,  lowered again to 5mg  tid today   Aranesp: Given: no   Date and amount of last dose: 0  Last Hgb: 7.5 PRBC's Given: 0 Date/# of units: 0 ESA dose for discharge: mircera 100 mcg IV q 2 weeks  give NEXT HD  IV Iron dose at discharge: 50mg  weekly   Heparin change: 0  EDW Change: no changes  per Dr Arlean Hopping notes New EDW:   [place] Change: no  Access intervention/Change: np Details:  Hectorol/Calcitriol change: no  Discharge Labs: Calcium8.1 Phosphorus 5.0 Albumin 1.9 K+ 3.2   IV Antibiotics: no Details:  On Coumadin?: no Last INR: Next INR: Managed By:   OTHER/APPTS/LAB ORDERS:    D/C Meds to be reconciled by nurse after every discharge.  Completed ZO:XWRUE Chellie Vanlue PA -C 12/29/2022, 1:09 PM  [facility] Pager: [phone]  Reviewed by: MD:______ RN_______

## 2022-12-30 IMAGING — XA IR FLUORO GUIDE CV LINE*L*
1 series · 1 of 1 positions shown · non-contrast
Comparison: none

CLINICAL DATA: Recent removal of right IJ catheter. Needs access
for hemodialysis

EXAM:
EXAM
LEFT IJ CATHETER PLACEMENT UNDER ULTRASOUND AND FLUOROSCOPIC
GUIDANCE
TECHNIQUE: The procedure, risks (including but not limited to bleeding,
infection, organ damage, pneumothorax), benefits, and alternatives
were explained to the patient. Questions regarding the procedure
were encouraged and answered. The patient understands and consents
to the procedure. Patency of the LEFT IJ vein was confirmed with
ultrasound with image documentation. An appropriate skin site was
determined. Skin site was marked. Region was prepped using maximum
barrier technique including cap and mask, sterile gown, sterile
gloves, large sterile sheet, and Chlorhexidine as cutaneous
antisepsis. The region was infiltrated locally with 1% lidocaine.
Under real-time ultrasound guidance, the LEFT IJ vein was accessed
with a 21 gauge needle; the needle tip within the vein was confirmed
with ultrasound image documentation. The needle exchanged over a
guidewire for vascular dilator which allowed advancement of a 20 cm
Trialysis catheter. This was positioned with the in the mid right
atrium. Spot chest radiograph shows good positioning and no
pneumothorax. Catheter was flushed and sutured externally with
0-Prolene sutures. Patient tolerated the procedure well.
FLUOROSCOPY:
Radiation Exposure Index (as provided by the fluoroscopic device): 1
mGy air Kerma
COMPLICATIONS:
COMPLICATIONS
none

[Series 300: ir radiologist eval & mgmt · 1 of 1 slices shown]
[im 1/1]
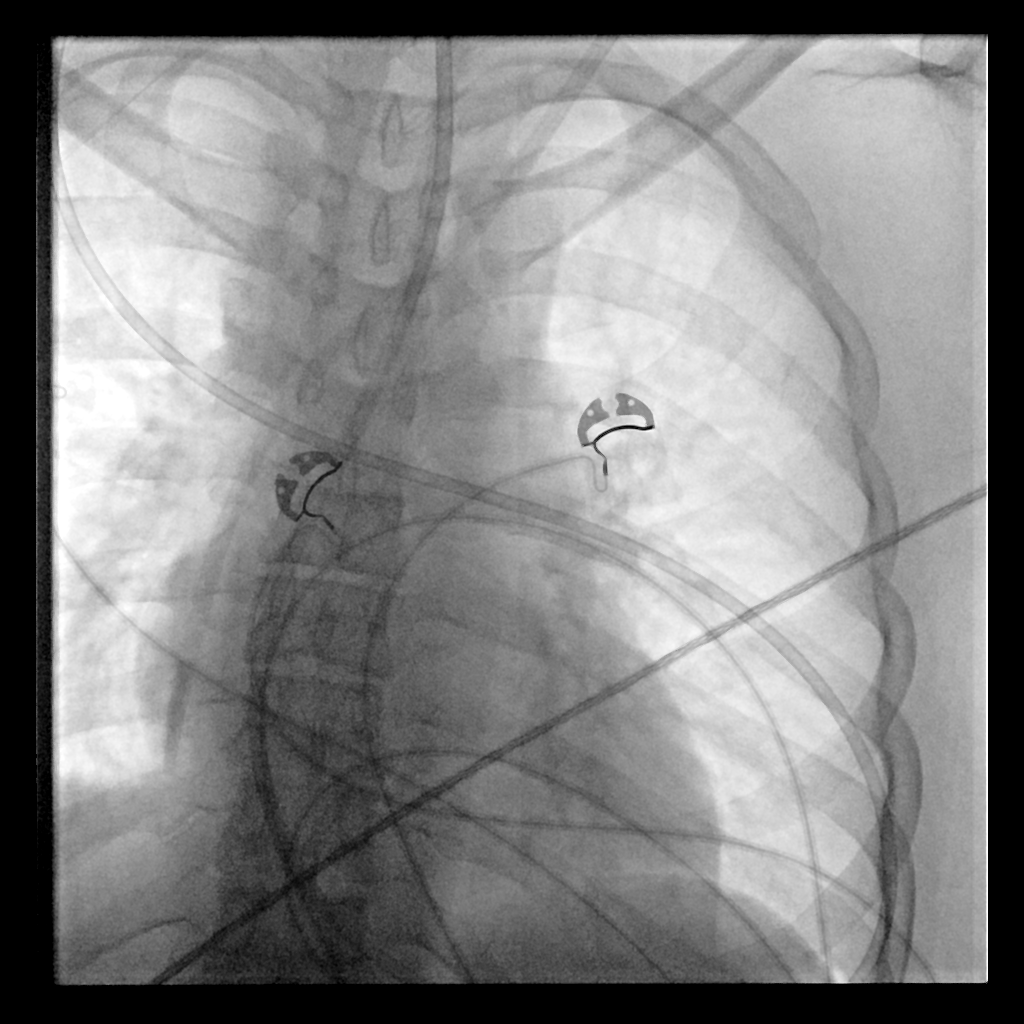

[1 of 1 positions shown; findings below may reference images not displayed]

IMPRESSION: 1. Technically successful left IJ Trialysis catheter placement.

## 2023-01-03 IMAGING — DX DG CHEST 1V PORT
1 series · 1 of 1 positions shown · non-contrast
Comparison: 10/15/2021

CLINICAL DATA: Shortness of breath.  Recent stroke.  On dialysis.

EXAM:
PORTABLE CHEST 1 VIEW

[chest ap]
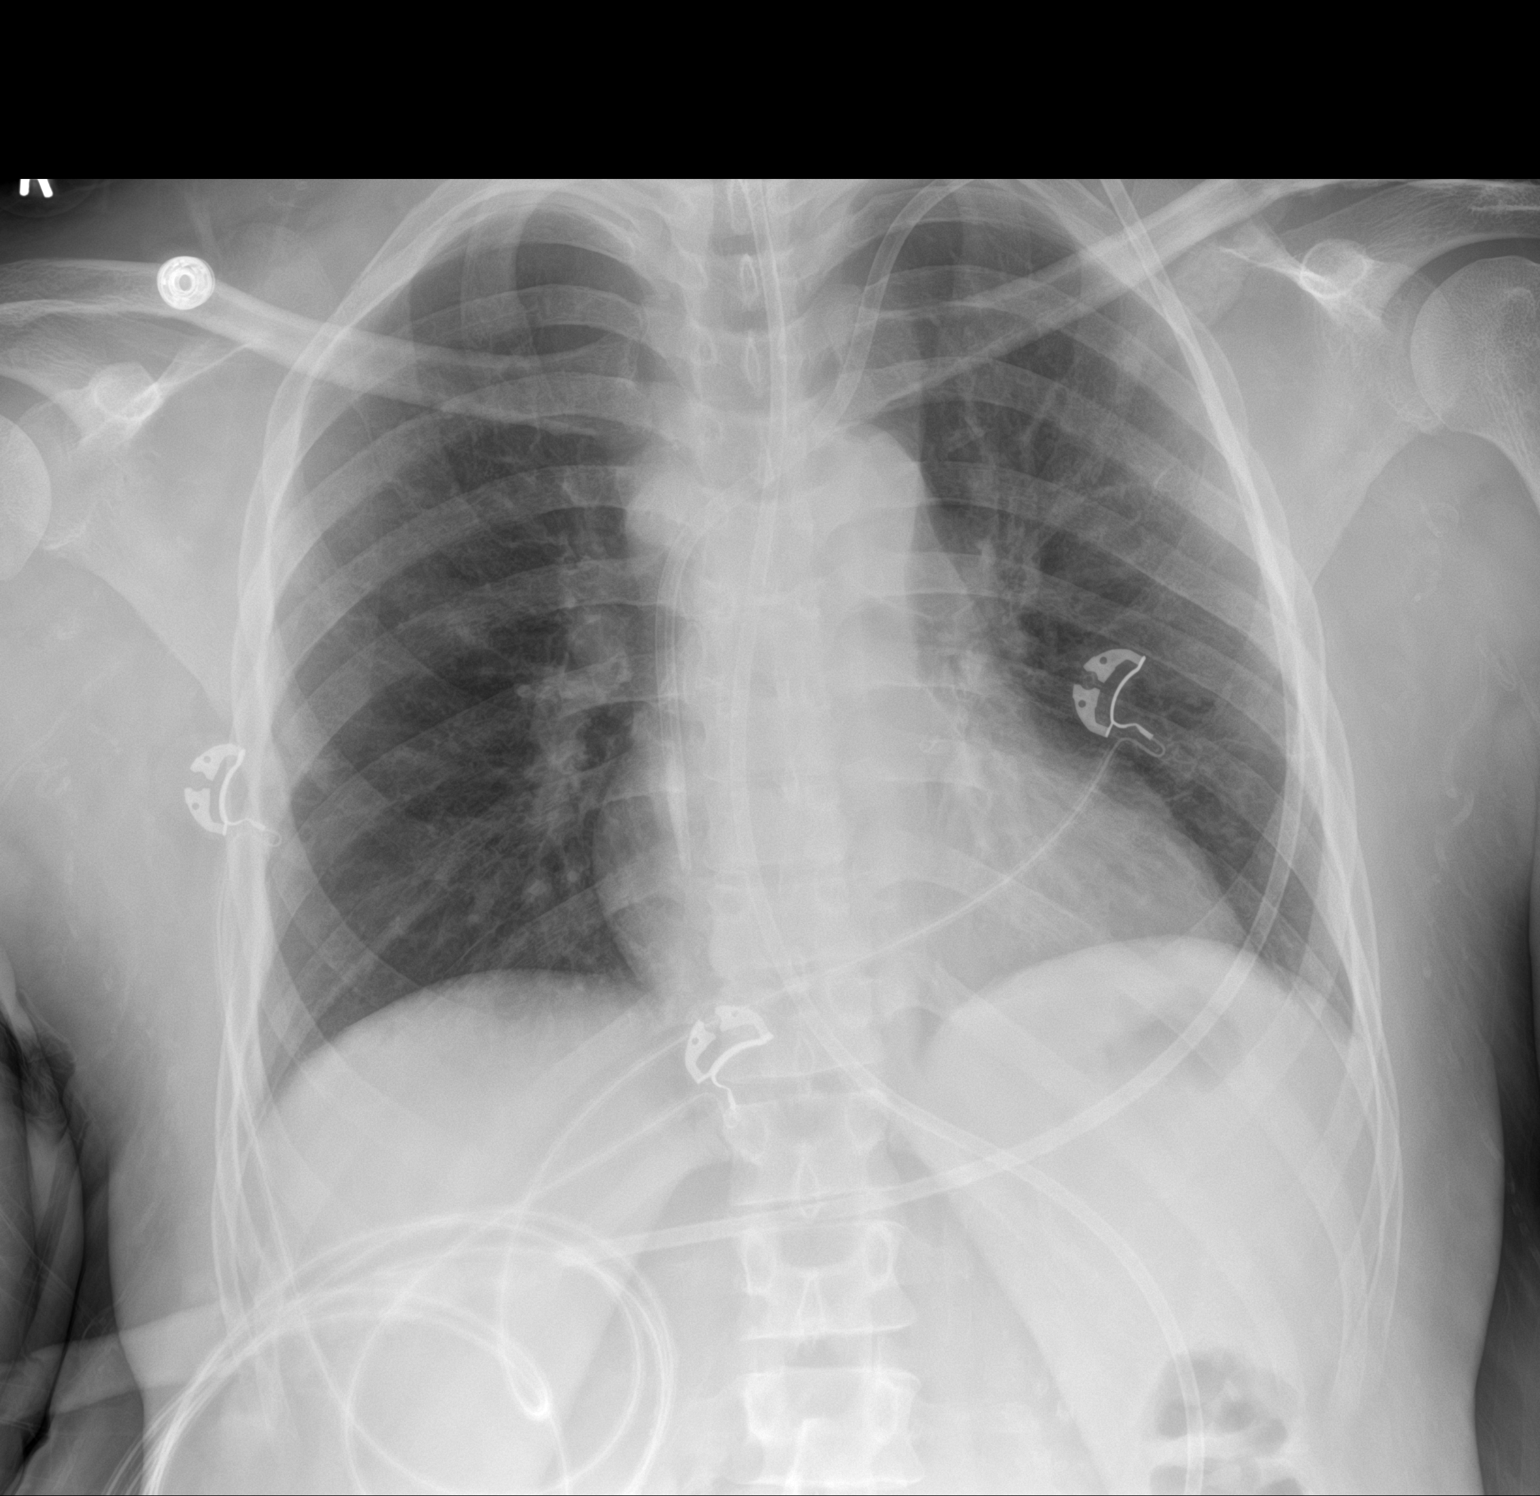

[1 of 1 positions shown; findings below may reference images not displayed]

FINDINGS: A left-sided dialysis catheter terminates at the mid right atrium.
Feeding tube extends beyond the inferior aspect of the film.

Numerous leads and wires project over the chest. Midline trachea.
Borderline cardiomegaly. No pleural effusion or pneumothorax. No
congestive failure. Mild pulmonary interstitial prominence is
slightly asymmetric, greater left than right.
IMPRESSION: Development of mild pulmonary interstitial prominence. Although this
could be within normal variation given AP portable technique,
pulmonary venous congestion is suspected. No overt congestive
failure.

## 2023-02-06 IMAGING — CT CT HEAD W/O CM
3 series · 14 of 46 positions shown, 16 images · non-contrast
Comparison: Brain MRI 10/07/2021. CT angiogram head/neck
09/22/2021. Head CT 09/22/2021.

CLINICAL DATA: Mental status change, unknown cause.



[Series 2: head 5.0 h30s · axial · 0.43mm/px · z∈[-312,-192]mm · 8 of 29 slices shown, 10 images]
[im 3/29  brain]
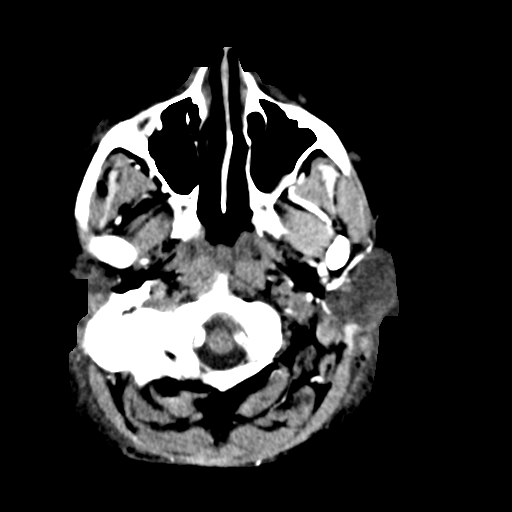
[im 3/29  bone]
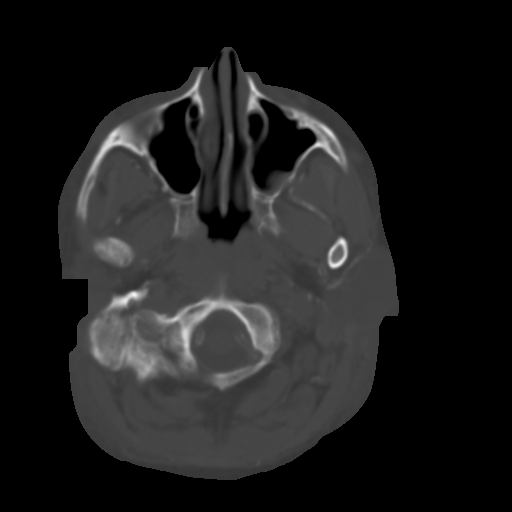
[im 7/29  brain]
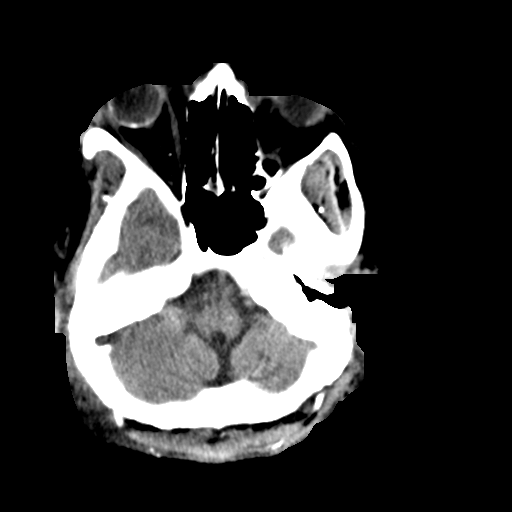
[im 10/29  brain]
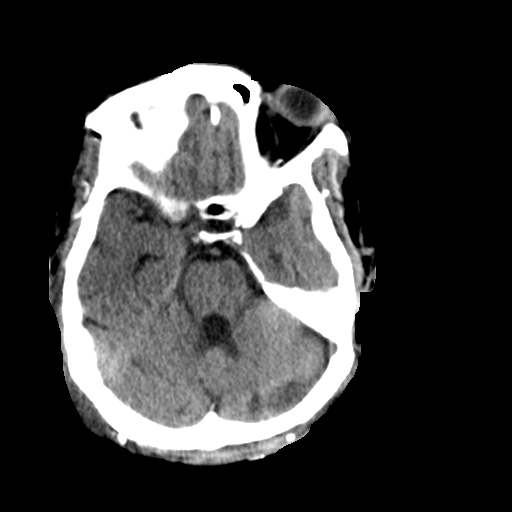
[im 13/29  brain]
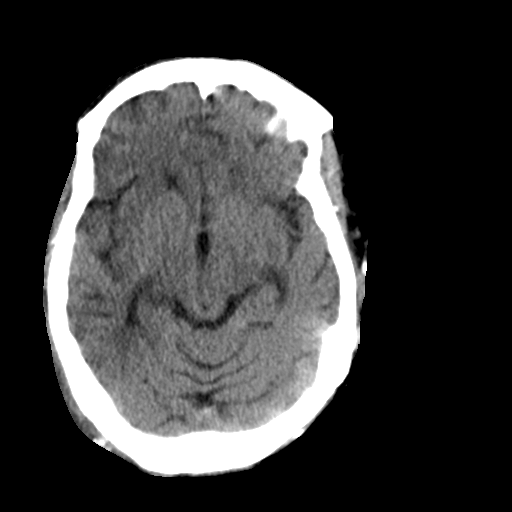
[im 17/29  brain]
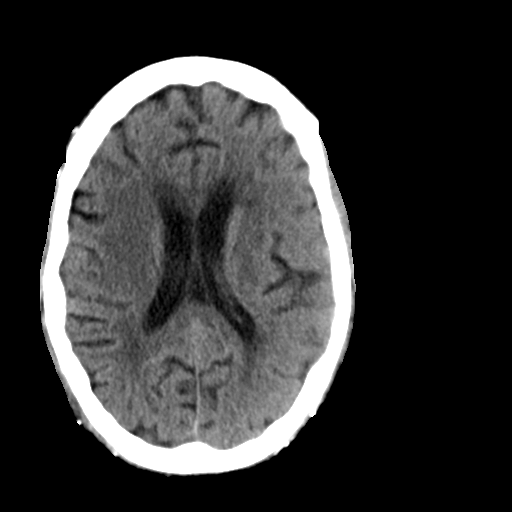
[im 17/29  bone]
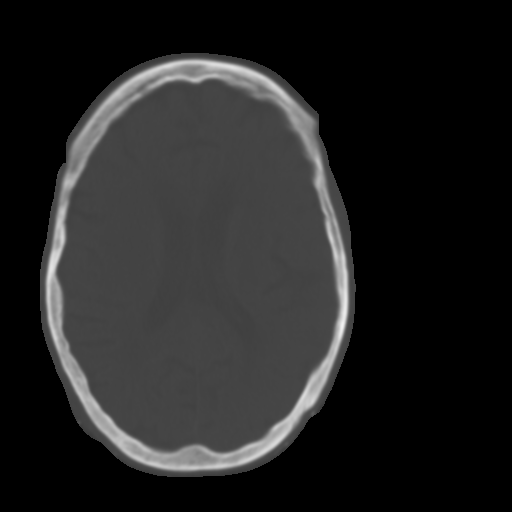
[im 20/29  brain]
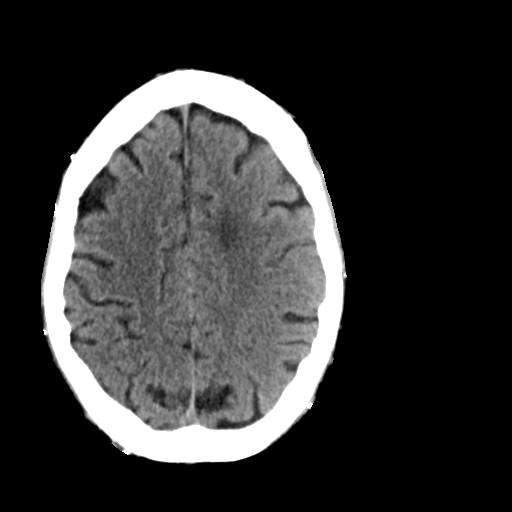
[im 23/29  brain]
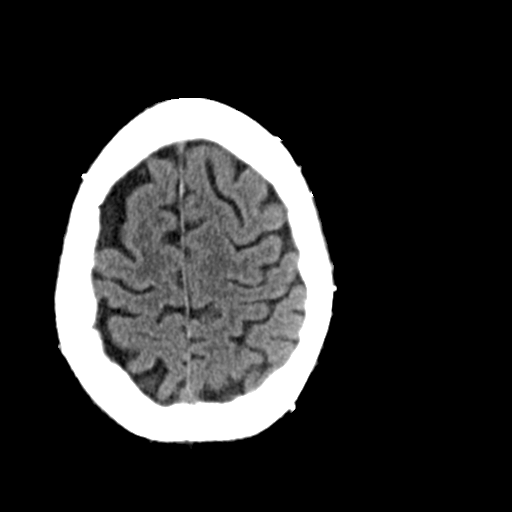
[im 27/29  brain]
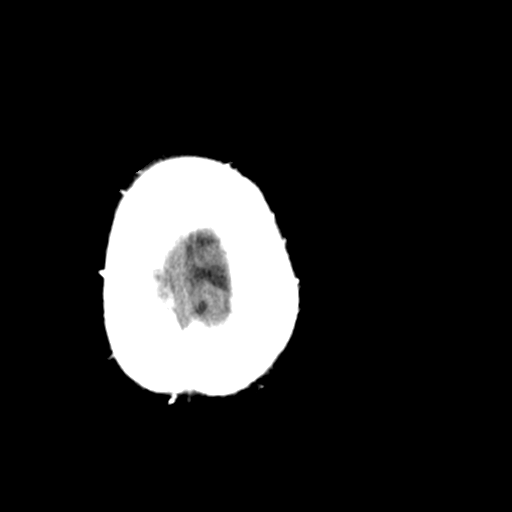

[Series 4: head 3.0 mpr cor · coronal · 0.34mm/px · 3 of 71 slices shown]
[im 24/71  brain]
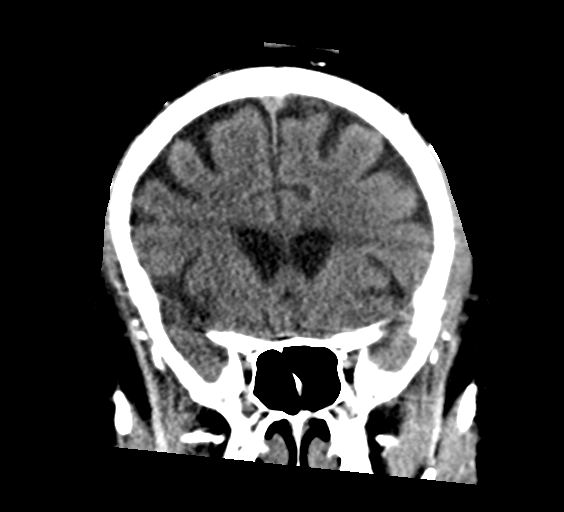
[im 32/71  brain]
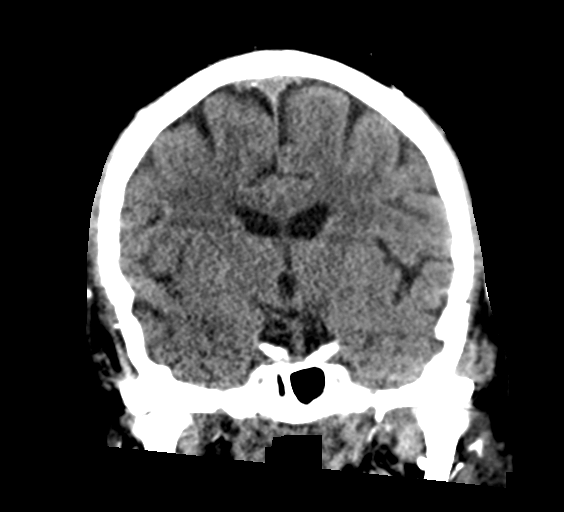
[im 39/71  brain]
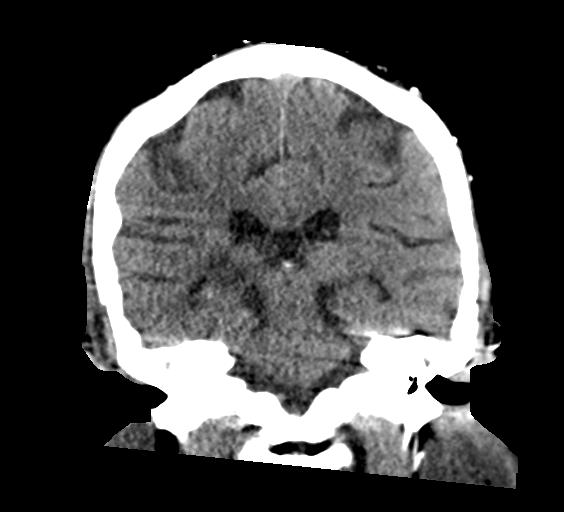

[Series 5: head 3.0 mpr sag · sagittal · 0.33mm/px · 3 of 67 slices shown]
[im 24/67  brain]
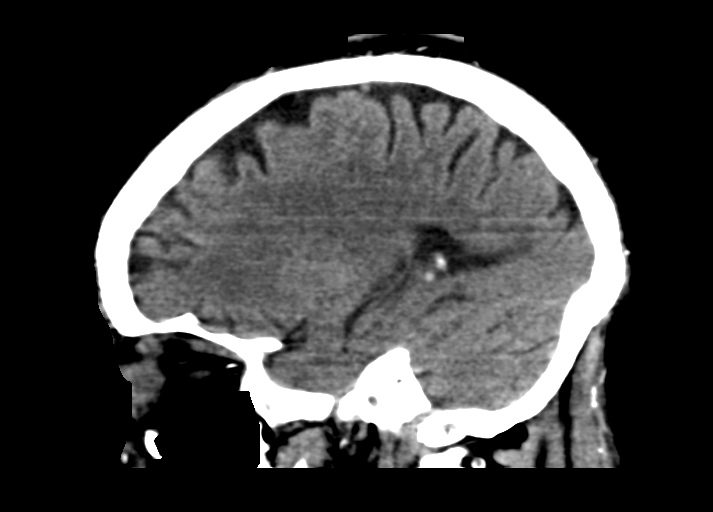
[im 34/67  brain]
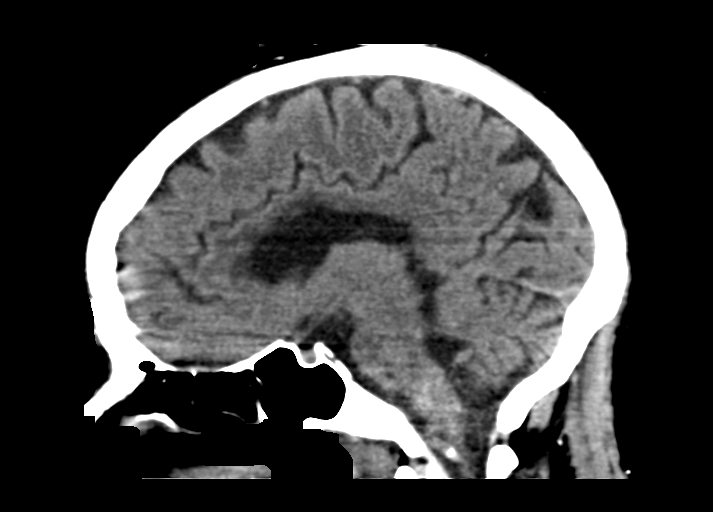
[im 43/67  brain]
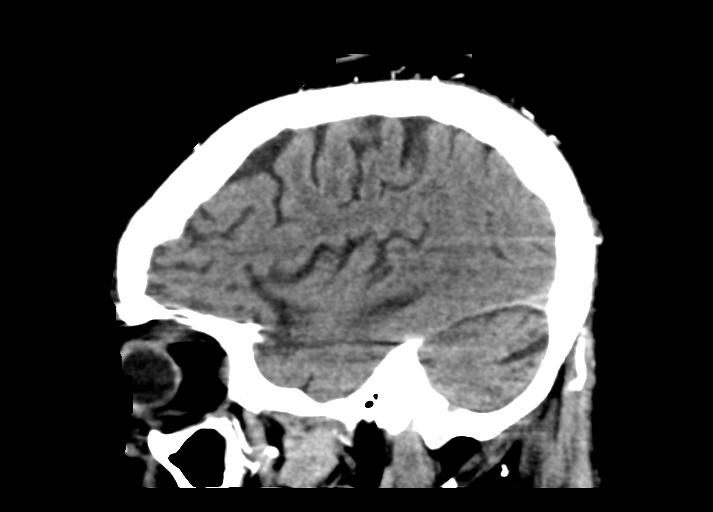

[14 of 46 positions shown; findings below may reference images not displayed]

FINDINGS: Mildly motion degraded exam.

Brain:

Mild generalized cerebral atrophy.

Redemonstrated chronic lacunar infarcts within the left corona
radiata and left thalamus.

Background mild patchy and ill-defined hypoattenuation within the
cerebral white matter, nonspecific but compatible with chronic small
vessel ischemic disease.

Redemonstrated chronic infarcts within the left cerebellar
hemisphere.

There is no acute intracranial hemorrhage.

No demarcated cortical infarct.

No extra-axial fluid collection.

No evidence of an intracranial mass.

No midline shift.

Vascular: No hyperdense vessel.  Atherosclerotic calcifications.

Skull: Normal. Negative for fracture or focal lesion.

Sinuses/Orbits: Bilateral periorbital soft tissue edema/swelling.
Mild mucosal thickening within posterior left ethmoid air cells.
Mild mucosal thickening and small volume fluid within the bilateral
sphenoid sinuses. Mild mucosal thickening within the right maxillary
sinus at the imaged levels. Mild-to-moderate mucosal thickening
within the left maxillary sinus at the imaged levels.

Other: Incompletely imaged nonspecific edema within the posterior
upper neck and periauricular soft tissues, bilaterally.
IMPRESSION: No evidence of acute intracranial abnormality.

Redemonstrated chronic lacunar infarcts within the left corona
radiata and left thalamus.

Background mild chronic small vessel image changes within the
cerebral white matter.

Redemonstrated chronic infarcts within the left cerebellar
hemisphere.

Mild generalized cerebral atrophy.

Paranasal sinus disease, as described.

Nonspecific bilateral periorbital soft tissue swelling/edema.
Incompletely imaged nonspecific edema also present within the
posterior upper neck and periauricular soft tissues, bilaterally.
Clinical correlation is recommended.

## 2023-03-14 ENCOUNTER — Other Ambulatory Visit: Payer: Self-pay

## 2023-03-14 DIAGNOSIS — Z1231 Encounter for screening mammogram for malignant neoplasm of breast: Secondary | ICD-10-CM

## 2023-03-17 ENCOUNTER — Emergency Department (HOSPITAL_COMMUNITY): Admission: EM | Admit: 2023-03-17 | Payer: Medicare Other | Source: Home / Self Care

## 2023-03-17 ENCOUNTER — Encounter (HOSPITAL_COMMUNITY): Payer: Self-pay

## 2023-03-17 ENCOUNTER — Other Ambulatory Visit: Payer: Self-pay

## 2023-03-17 ENCOUNTER — Emergency Department (HOSPITAL_BASED_OUTPATIENT_CLINIC_OR_DEPARTMENT_OTHER): Payer: Medicare Other

## 2023-03-17 ENCOUNTER — Emergency Department (HOSPITAL_COMMUNITY): Payer: Medicare Other

## 2023-03-17 DIAGNOSIS — S4991XA Unspecified injury of right shoulder and upper arm, initial encounter: Secondary | ICD-10-CM | POA: Diagnosis present

## 2023-03-17 DIAGNOSIS — X58XXXA Exposure to other specified factors, initial encounter: Secondary | ICD-10-CM | POA: Insufficient documentation

## 2023-03-17 DIAGNOSIS — S43121A Dislocation of right acromioclavicular joint, 100%-200% displacement, initial encounter: Secondary | ICD-10-CM | POA: Diagnosis not present

## 2023-03-17 DIAGNOSIS — N186 End stage renal disease: Secondary | ICD-10-CM | POA: Diagnosis not present

## 2023-03-17 DIAGNOSIS — S43101A Unspecified dislocation of right acromioclavicular joint, initial encounter: Secondary | ICD-10-CM

## 2023-03-17 DIAGNOSIS — L0291 Cutaneous abscess, unspecified: Secondary | ICD-10-CM

## 2023-03-17 DIAGNOSIS — Z91010 Allergy to peanuts: Secondary | ICD-10-CM | POA: Diagnosis not present

## 2023-03-17 DIAGNOSIS — Z794 Long term (current) use of insulin: Secondary | ICD-10-CM | POA: Insufficient documentation

## 2023-03-17 DIAGNOSIS — I729 Aneurysm of unspecified site: Secondary | ICD-10-CM | POA: Diagnosis not present

## 2023-03-17 DIAGNOSIS — L02413 Cutaneous abscess of right upper limb: Secondary | ICD-10-CM | POA: Diagnosis not present

## 2023-03-17 DIAGNOSIS — Z992 Dependence on renal dialysis: Secondary | ICD-10-CM | POA: Insufficient documentation

## 2023-03-17 DIAGNOSIS — E1022 Type 1 diabetes mellitus with diabetic chronic kidney disease: Secondary | ICD-10-CM | POA: Insufficient documentation

## 2023-03-17 DIAGNOSIS — M7989 Other specified soft tissue disorders: Secondary | ICD-10-CM | POA: Diagnosis not present

## 2023-03-17 DIAGNOSIS — Z7982 Long term (current) use of aspirin: Secondary | ICD-10-CM | POA: Diagnosis not present

## 2023-03-17 DIAGNOSIS — I120 Hypertensive chronic kidney disease with stage 5 chronic kidney disease or end stage renal disease: Secondary | ICD-10-CM | POA: Diagnosis not present

## 2023-03-17 DIAGNOSIS — Z79899 Other long term (current) drug therapy: Secondary | ICD-10-CM | POA: Insufficient documentation

## 2023-03-17 LAB — BASIC METABOLIC PANEL
Anion gap: 14 (ref 5–15)
BUN: 38 mg/dL — ABNORMAL HIGH (ref 6–20)
CO2: 29 mmol/L (ref 22–32)
Calcium: 9.8 mg/dL (ref 8.9–10.3)
Chloride: 94 mmol/L — ABNORMAL LOW (ref 98–111)
Creatinine, Ser: 5.85 mg/dL — ABNORMAL HIGH (ref 0.44–1.00)
GFR, Estimated: 9 mL/min — ABNORMAL LOW (ref >60)
Glucose, Bld: 90 mg/dL (ref 70–99)
Potassium: 5.3 mmol/L — ABNORMAL HIGH (ref 3.5–5.1)
Sodium: 137 mmol/L (ref 135–145)

## 2023-03-17 LAB — CBC
HCT: 36.8 % (ref 36.0–46.0)
Hemoglobin: 11 g/dL — ABNORMAL LOW (ref 12.0–15.0)
MCH: 25.3 pg — ABNORMAL LOW (ref 26.0–34.0)
MCHC: 29.9 g/dL — ABNORMAL LOW (ref 30.0–36.0)
MCV: 84.8 fL (ref 80.0–100.0)
Platelets: 339 10*3/uL (ref 150–400)
RBC: 4.34 MIL/uL (ref 3.87–5.11)
RDW: 16.3 % — ABNORMAL HIGH (ref 11.5–15.5)
WBC: 13.2 10*3/uL — ABNORMAL HIGH (ref 4.0–10.5)
nRBC: 0 % (ref 0.0–0.2)

## 2023-03-17 LAB — CBG MONITORING, ED: Glucose-Capillary: 71 mg/dL (ref 70–99)

## 2023-03-17 LAB — I-STAT CG4 LACTIC ACID, ED: Lactic Acid, Venous: 1.3 mmol/L (ref 0.5–1.9)

## 2023-03-17 MED ORDER — LIDOCAINE-EPINEPHRINE (PF) 2 %-1:200000 IJ SOLN
10.0000 mL | Freq: Once | INTRAMUSCULAR | Status: AC
  Administered 2023-03-17: 10 mL
  Filled 2023-03-17: qty 20

## 2023-03-17 MED ORDER — OXYCODONE-ACETAMINOPHEN 5-325 MG PO TABS
1.0000 | ORAL_TABLET | Freq: Once | ORAL | Status: AC
  Administered 2023-03-17: 1 via ORAL
  Filled 2023-03-17: qty 1

## 2023-03-17 MED ORDER — SODIUM ZIRCONIUM CYCLOSILICATE 10 G PO PACK
10.0000 g | PACK | Freq: Once | ORAL | Status: AC
  Administered 2023-03-17: 10 g via ORAL
  Filled 2023-03-17: qty 1

## 2023-03-17 MED ORDER — CLINDAMYCIN HCL 150 MG PO CAPS: 150.0000 mg | ORAL_CAPSULE | Freq: Four times a day (QID) | ORAL | 0 refills | Status: DC

## 2023-03-17 MED ORDER — HYDROMORPHONE HCL 1 MG/ML IJ SOLN
0.5000 mg | Freq: Once | INTRAMUSCULAR | Status: AC
  Administered 2023-03-17: 0.5 mg via INTRAVENOUS
  Filled 2023-03-17: qty 1

## 2023-03-17 NOTE — ED Provider Notes (Signed)
Patient was received at shift change from Estanislado Pandy, MD please see note for full detail  In short patient with medical history including diabetes, hyperlipidemia, ESRD with dialysis Monday Wednesday Friday, right eye blindness, below the knee amputee bilaterally, hypertension, anxiety, presenting with complaints of abscess of the right shoulder, states been going on for a week's time, states that pain has gotten worse and having drainage, admits worsening right arm swelling, denies any paresthesias or weakness, states that she got her full treatment on Friday for dialysis, she has no complaints.  Per previous provider patient had I&D performed, she has good palpable thrill in the right arm, neurovascular intact,  obtained ultrasound was concerning for partial thrombosis of the aVF.  Recommends consultation with vascular surgery for further recommendations   Physical Exam  BP (!) 103/30   Pulse 77   Temp 98 F (36.7 C) (Oral)   Resp 17   Ht 5\' 1"  (1.549 m)   Wt 59 kg   SpO2 96%   BMI 24.56 kg/m   Physical Exam Vitals and nursing note reviewed.  Constitutional:      General: She is not in acute distress.    Appearance: She is not ill-appearing.  HENT:     Head: Normocephalic and atraumatic.     Nose: No congestion.  Eyes:     Conjunctiva/sclera: Conjunctivae normal.  Cardiovascular:     Rate and Rhythm: Normal rate and regular rhythm.     Pulses: Normal pulses.     Heart sounds: No murmur heard.    No friction rub. No gallop.  Pulmonary:     Effort: No respiratory distress.     Breath sounds: No wheezing, rhonchi or rales.  Musculoskeletal:     Comments: Below the knee amputee bilaterally, focused exam of the right upper extremity reveals unilateral swelling of the right bicep versus the left, she has a noted abscess on the superior lateral border of the right scapula, actively draining, approximately size of a quarter, there is no surrounding erythema, she has a EC fistula,  good palpable thrill, no overlying skin changes.  Patient has a 2-second capillary refill, 1+ radial pulses, she has gross motor function intact in the right upper extremity.    Skin:    General: Skin is warm and dry.  Neurological:     Mental Status: She is alert.  Psychiatric:        Mood and Affect: Mood normal.     Procedures  Procedures  ED Course / MDM   Clinical Course as of 03/18/23 0116  Sun Mar 17, 2023  1713 CBC(!) Mild leukocytosis. [TY]  1713 Hemoglobin(!): 11.0 Improved compared to prior. [TY]  1731 Basic metabolic panel(!) Consistent with ESRD on dialysis.  No significant metabolic derangements apart from borderline elevated potassium. [TY]  2102 Repositioned patient and cough.  Blood pressure in the 190s systolic [TY]  2112 VAS US DUPLEX DIALYSIS ACCESS (AVF, AVG) Partial versus complete thrombosis.  Consulting vascular surgery. [TY]  2112 PMH significant for ESRD HD MWF, DM1 since age 2, HTN, CVA, h/o bilateral AKA, chronic anemia who is a long-term nursing home resident [TY]  2157 Paged out twice for vascular surgery regarding possible thrombosed AV fistula. [TY]    Clinical Course User Index [TY] Coral Spikes, DO   Medical Decision Making Amount and/or Complexity of Data Reviewed Labs: ordered. Decision-making details documented in ED Course. Radiology: ordered. Decision-making details documented in ED Course. ECG/medicine tests: ordered.  Risk Prescription drug  management.    Lab Tests:  I Ordered, and personally interpreted labs.  The pertinent results include: CBC shows leukocytosis of 13.2, numerous anemia hemoglobin 11.0, BMP reveals potassium 5.3, chloride 94, BUN 35, creatinine 5, lactic 1.3, CBG 71   Imaging Studies ordered:  I ordered imaging studies including plain film of the right shoulder, vascular ultrasound I independently visualized and interpreted imaging which showed x-ray reveals right AC joint separation without any other  acute abnormalities, ultrasound reveals partial thrombosis of the aVF, I agree with the radiologist interpretation   Cardiac Monitoring:  The patient was maintained on a cardiac monitor.  I personally viewed and interpreted the cardiac monitored which showed an underlying rhythm of: Without signs of ischemia   Medicines ordered and prescription drug management:  I ordered medication including Dilaudid I have reviewed the patients home medicines and have made adjustments as needed  Critical Interventions:  N/A   Reevaluation:  I assessed the patient, she is found resting comfortably, having no complaints, will consult with vascular surgery for further recommendations  Patient's BP has been slightly soft, this appears to be her baseline, will add on a lactic to rule out possible systemic infection also obtain x-ray of the right shoulder to rule out possible necrotizing fasciitis  Lactic is unremarkable, x-ray shows possible AC separation, palpated the area she is nontender to palpation, patient has pain at her abscess, likely this is an injury, patient is agreeable for discharge at this time  Consultations Obtained:  I requested consultation with the Dr. Juanetta Gosling,  and discussed lab and imaging findings as well as pertinent plan - they recommend: Patient be follow-up as an outpatient no further medical management necessary at this time    Test Considered:  N/a    Rule out Suspicion for necrotizing fasciitis or deep tissue infection as well as this time patient is nontoxic-appearing, vital signs reassuring, no noted gas seen on x-ray.  I doubt vascular compromise as she is neurovascular intact in the right upper extremity.  I doubt patient needs to be admitted for dialysis access as she has been able to get her dialysis treatment over the last week without issue.  Low suspicion for fracture or dislocation as x-ray does not feel any significant findings. low suspicion for ligament  or tendon damage as area was palpated no gross defects noted, they had full range of motion as well as 5/5 strength.  Low suspicion for compartment syndrome as area was palpated it was soft to the touch, neurovascular fully intact.     Dispostion and problem list  After consideration of the diagnostic results and the patients response to treatment, I feel that the patent would benefit from discharge.  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 Abscess-patient was started on clindamycin, will have her continue with this, follow-up with her PCP for further assessment. Right AC separation-likely this is an old injury, will have her follow-up with orthopedics as needed given strict return precautions.           Carroll Sage, PA-C 03/18/23 0116    Maia Plan, MD 03/21/23 [phone]

## 2023-03-17 NOTE — ED Provider Notes (Incomplete)
Patient was received at shift change from Estanislado Pandy, MD please see note for full detail  In short patient with medical history including diabetes, hyperlipidemia, ESRD with dialysis Monday Wednesday Friday, right eye blindness, below the knee amputee bilaterally, hypertension, anxiety, presenting with complaints of abscess of the right shoulder, states been going on for a week's time, states that pain has gotten worse and having drainage, admits worsening right arm swelling, denies any paresthesias or weakness, states that she got her full treatment on Friday for dialysis, she has no complaints.  Per previous provider patient had I&D performed, she has good palpable thrill in the right arm, nerve or neurovascular intact, obtained ultrasound was concerning for   Physical Exam  BP (!) 102/38   Pulse 83   Temp 98.3 F (36.8 C) (Oral)   Resp 11   Ht 5\' 1"  (1.549 m)   Wt 59 kg   SpO2 98%   BMI 24.56 kg/m   Physical Exam  Procedures  Procedures  ED Course / MDM   Clinical Course as of 03/17/23 2325  Sun Mar 17, 2023  1713 CBC(!) Mild leukocytosis. [TY]  1713 Hemoglobin(!): 11.0 Improved compared to prior. [TY]  1731 Basic metabolic panel(!) Consistent with ESRD on dialysis.  No significant metabolic derangements apart from borderline elevated potassium. [TY]  2102 Repositioned patient and cough.  Blood pressure in the 190s systolic [TY]  2112 VAS US DUPLEX DIALYSIS ACCESS (AVF, AVG) Partial versus complete thrombosis.  Consulting vascular surgery. [TY]  2112 PMH significant for ESRD HD MWF, DM1 since age 55, HTN, CVA, h/o bilateral AKA, chronic anemia who is a long-term nursing home resident [TY]  2157 Paged out twice for vascular surgery regarding possible thrombosed AV fistula. [TY]    Clinical Course User Index [TY] Coral Spikes, DO   Medical Decision Making Amount and/or Complexity of Data Reviewed Labs: ordered. Decision-making details documented in ED  Course. Radiology: ordered. Decision-making details documented in ED Course. ECG/medicine tests: ordered.  Risk Prescription drug management.   ***

## 2023-03-17 NOTE — ED Provider Notes (Signed)
[place] EMERGENCY DEPARTMENT AT [facility] Provider Note   CSN: [id number] Arrival date & time: 03/17/23  1540     History {Add pertinent medical, surgical, social history, OB history to HPI:1} Chief Complaint  Patient presents with   Arm Swelling    Abscess    Left arm     [name] is a 42 y.o. female.  42 year old female presenting emergency department for right upper extremity arm swelling as well as "boil" to her right posterior shoulder.  She notes pain to her posterior arm/boil since Friday.  However she notes that her entire arm has been swollen for the past week.  Abscess was hurting more today.  No fevers.  No drainage.  She is ESRD, does have fistula on same arm.  She goes to dialysis Monday Wednesday Friday.  Last dialysis Friday for full session.  No issues with fistula.   Abscess      Home Medications Prior to Admission medications   Medication Sig Start Date End Date Taking? Authorizing Provider  acetaminophen (TYLENOL) 325 MG tablet Take 650 mg by mouth every 6 (six) hours as needed for fever.   Yes [provider]  aspirin EC 81 MG tablet Take 1 tablet (81 mg total) by mouth daily. Swallow whole. 07/27/22  Yes Vann, Jessica U, DO  atorvastatin (LIPITOR) 40 MG tablet Place 1 tablet (40 mg total) into feeding tube daily. 10/26/21  Yes Burnadette Pop, MD  azelastine (OPTIVAR) 0.05 % ophthalmic solution Place 1 drop into both eyes 2 (two) times daily as needed (allergies). 11/13/22  Yes [provider]  cetirizine (ZYRTEC) 10 MG tablet Take 10 mg by mouth daily.   Yes [provider]  cinacalcet (SENSIPAR) 90 MG tablet Take 90 mg by mouth daily. 09/22/19  Yes [provider]  dorzolamide-timolol (COSOPT) 22.3-6.8 MG/ML ophthalmic solution Place 1 drop into the left eye 2 (two) times daily. 03/30/21  Yes [provider]  insulin aspart (NOVOLOG) 100 UNIT/ML injection Inject 0-6 Units into the skin every 4 (four)  hours. Patient taking differently: Inject 0-10 Units into the skin 3 (three) times daily with meals. 121-150=1 unit 152-200=2 units 201-250=3 units 251-300=5 units 301-350=7 units 351-400=10 units 10/25/21  Yes Adhikari, Amrit, MD  insulin glargine (SEMGLEE) 100 UNIT/ML injection Inject 0.1 mLs (10 Units total) into the skin daily at 6 PM. Patient taking differently: Inject 12 Units into the skin 2 (two) times daily. 12/28/22  Yes Dahal, Melina Schools, MD  insulin glargine-yfgn (SEMGLEE) 100 UNIT/ML Pen Inject into the skin. 01/04/23  Yes [provider]  isosorbide mononitrate (IMDUR) 30 MG 24 hr tablet Take 1 tablet (30 mg total) by mouth daily. 07/27/22  Yes Marlin Canary U, DO  metoprolol tartrate (LOPRESSOR) 25 MG tablet Take 0.5 tablets (12.5 mg total) by mouth 2 (two) times daily. 07/27/22  Yes Vann, Jessica U, DO  midodrine (PROAMATINE) 5 MG tablet Take 5 mg by mouth 3 (three) times daily with meals.   Yes [provider]  Multiple Vitamin (MULTIVITAMIN WITH MINERALS) TABS tablet Take 1 tablet by mouth daily.   Yes [provider]  Nutritional Supplements (NUTRITIONAL DRINK) LIQD Take 120 mLs by mouth daily.   Yes [provider]  ondansetron (ZOFRAN) 4 MG tablet Take 4 mg by mouth every 6 (six) hours as needed for nausea/vomiting. 03/02/22  Yes [provider]  polyethylene glycol (MIRALAX / GLYCOLAX) 17 g packet Take 17 g by mouth daily at 6 PM.  Yes [provider]  thiamine 100 MG tablet Place 1 tablet (100 mg total) into feeding tube daily. 10/26/21  Yes Burnadette Pop, MD  Accu-Chek FastClix Lancets MISC Use as instructed to check blood sugar up to TID. E11.22 Patient taking differently: 1 each by Other route See admin instructions. Use as instructed to check blood sugar up to TID. E11.22 04/15/19   Hoy Register, MD  Blood Glucose Monitoring Suppl (ACCU-CHEK GUIDE ME) w/Device KIT 1 kit by Does not apply route 3 (three) times daily. Use  to check BG at home up to 3 times daily. E11.22 04/15/19   Hoy Register, MD  Continuous Blood Gluc Transmit (DEXCOM G6 TRANSMITTER) MISC 1 Device by Does not apply route as directed. 09/12/20   Shamleffer, Konrad Dolores, MD  docusate sodium (COLACE) 100 MG capsule Take 100 mg by mouth at bedtime.    [provider]  glucose blood (ACCU-CHEK GUIDE) test strip Use as instructed to check blood sugar up to TID. E11.22 Patient taking differently: 1 each by Other route See admin instructions. Use as instructed to check blood sugar up to TID. E11.22 07/17/19   Fulp, Cammie, MD  melatonin 3 MG TABS tablet Take 3 mg by mouth at bedtime.    [provider]  oxyCODONE-acetaminophen (PERCOCET) 5-325 MG tablet Take 1 tablet by mouth every 8 (eight) hours as needed for severe pain. 07/27/22   Joseph Art, DO      Allergies    Morphine, Peanut-containing drug products, Chocolate, and Shellfish allergy    Review of Systems   Review of Systems  All other systems reviewed and are negative.   Physical Exam Updated Vital Signs BP (!) 121/32 (BP Location: Left Wrist)   Pulse 82   Temp 98.3 F (36.8 C) (Oral)   Resp 15   Ht 5\' 1"  (1.549 m)   Wt 59 kg   SpO2 98%   BMI 24.56 kg/m  Physical Exam Vitals and nursing note reviewed.  Constitutional:      General: She is not in acute distress.    Appearance: She is not toxic-appearing.  HENT:     Head: Normocephalic and atraumatic.     Nose: Nose normal.  Eyes:     Pupils: Pupils are equal, round, and reactive to light.  Cardiovascular:     Rate and Rhythm: Normal rate and regular rhythm.  Pulmonary:     Effort: Pulmonary effort is normal.  Abdominal:     General: Abdomen is flat. There is no distension.     Tenderness: There is no abdominal tenderness. There is no guarding or rebound.  Musculoskeletal:     Comments: Patient has diffuse swelling to her right upper extremity.  Soft compartments.  2+ radial pulse.  Neurovascular  intact with good motor and sensation.  AV fistula in the upper arm with palpable thrill.  Her posterior shoulder at the crease of axilla has small 4 cm area of induration and fluctuance with overlying ulceration.  Some purulent drainage noted on bandage.  Skin:    General: Skin is warm and dry.     Capillary Refill: Capillary refill takes less than 2 seconds.  Neurological:     General: No focal deficit present.     Mental Status: She is alert.  Psychiatric:        Mood and Affect: Mood normal.        Behavior: Behavior normal.     ED Results / Procedures / Treatments  Labs (all labs ordered are listed, but only abnormal results are displayed) Labs Reviewed  CBC - Abnormal; Notable for the following components:      Result Value   WBC 13.2 (*)    Hemoglobin 11.0 (*)    MCH 25.3 (*)    MCHC 29.9 (*)    RDW 16.3 (*)    All other components within normal limits  BASIC METABOLIC PANEL - Abnormal; Notable for the following components:   Potassium 5.3 (*)    Chloride 94 (*)    BUN 38 (*)    Creatinine, Ser 5.85 (*)    GFR, Estimated 9 (*)    All other components within normal limits  CBG MONITORING, ED    EKG EKG Interpretation Date/Time:  Sunday March 17 2023 17:40:53 EDT Ventricular Rate:  76 PR Interval:  128 QRS Duration:  84 QT Interval:  391 QTC Calculation: 440 R Axis:   80  Text Interpretation: Sinus rhythm Low voltage, extremity leads Confirmed by ,  ([id number]) on 03/17/2023 7:32:19 PM  Radiology VAS US DUPLEX DIALYSIS ACCESS (AVF, AVG)  Result Date: 03/17/2023 DIALYSIS ACCESS Patient Name:  [name]  Date of Exam:   03/17/2023 Medical Rec #: [id number]      Accession #:    [id number] Date of Birth: 05/23/1981       Patient Gender: F Patient Age:   42 years Exam Location:  [facility] Procedure:      VAS US DUPLEX DIALYSIS ACCESS (AVF, AVG) Referring Phys:   --------------------------------------------------------------------------------   Reason for Exam: Pain and swelling - concern for AVF malfunction. Access Site: Right Upper Extremity. Access Type: Brachial-cephalic AVF. History: HX of RUE AVF revision 10/23/21 (aneurysm complication & ulcerated          skin). Limitations: Patient VERY uncooperative due to condition. Constant movement and              unable to position properly for exam. Calcific shadowing Comparison Study: No previous duplex dialysis access exams. 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 Performing Technologist: Jody Hill RVT, RDMS  Examination Guidelines: A complete evaluation includes B-mode imaging, spectral Doppler, color Doppler, and power Doppler as needed of all accessible portions of each vessel. Unilateral testing is considered an integral part of a complete examination. Limited examinations for reoccurring indications may be performed as noted.  Findings: +--------------------+----------+-----------------+----------------------------+ AVF                 PSV (cm/s)Flow Vol (mL/min)          Comments           +--------------------+----------+-----------------+----------------------------+ Native artery inflow   152           554                                    +--------------------+----------+-----------------+----------------------------+ AVF Anastomosis        20 0                     Heavily calcified, extensive  shadowing - poorly                                                              visualized          +--------------------+----------+-----------------+----------------------------+  +------------+----------+-------------+----------+--------------------------+ OUTFLOW VEINPSV (cm/s)Diameter (cm)Depth (cm)         Describe           +------------+----------+-------------+----------+--------------------------+ Shoulder        57        0.97        0.64                              +------------+----------+-------------+----------+--------------------------+ Prox UA         74        0.90        0.50                              +------------+----------+-------------+----------+--------------------------+ Mid UA         145        1.54        0.32                              +------------+----------+-------------+----------+--------------------------+ Dist UA        121        1.01        0.89                              +------------+----------+-------------+----------+--------------------------+ AC Fossa                                     not visualized - shadowing +------------+----------+-------------+----------+--------------------------+ IJV not well visualized due to patient position, patent in areas visualized. Subclavian vein patent by color and doppler.   Summary: Volume flow <600cm/s Arteriovenous fistula-Velocities less than 100cm/s noted (proximal UE & shoulder). Arteriovenous fistula-Thrombus noted.  Heavy calcification noted throughout AVF. Arteriovenous fistula-Aneurysmal dilatation noted in the Mid upper arm.  Partially thrombosed dilitation of AVF in mid upper arm 2.64 x 1.51 x 1.95. (pseudoaneurym vs aneurysmal segment). Given limited and difficult exam due to calcific shadowing and lack of patient cooperation further imaging may be warranted.  *See table(s) above for measurements and observations.     --------------------------------------------------------------------------------   Preliminary     Procedures .Marland KitchenIncision and Drainage  Date/Time: 03/17/2023 8:52 PM  Performed by: Coral Spikes, DO Authorized by: Coral Spikes, DO   Consent:    Consent obtained:  Verbal   Consent given by:  Patient   Risks discussed:  Bleeding, incomplete drainage, pain and infection   Alternatives  discussed:  No treatment and delayed treatment Universal protocol:    Patient identity confirmed:  Verbally with patient Location:    Type:  Abscess   Size:  4   Location:  Upper extremity   Upper extremity location: right posterior shoulder. Pre-procedure details:    Skin preparation:  Povidone-iodine Anesthesia:    Anesthesia method:  Local infiltration  Local anesthetic:  Lidocaine 1% WITH epi Procedure type:    Complexity:  Simple Procedure details:    Incision types:  Stab incision   Incision depth:  Dermal   Wound management:  Irrigated with saline   Drainage:  Purulent   Drainage amount:  Moderate   Wound treatment:  Wound left open   Packing materials:  None Post-procedure details:    Procedure completion:  Procedure terminated at patient's request   {Document cardiac monitor, telemetry assessment procedure when appropriate:1}  Medications Ordered in ED Medications  oxyCODONE-acetaminophen (PERCOCET/ROXICET) 5-325 MG per tablet 1 tablet (has no administration in time range)  lidocaine-EPINEPHrine (XYLOCAINE W/EPI) 2 %-1:200000 (PF) injection 10 mL (10 mLs Infiltration Given 03/17/23 1733)    ED Course/ Medical Decision Making/ A&P Clinical Course as of 03/17/23 2127  Sun Mar 17, 2023  1713 CBC(!) Mild leukocytosis. [TY]  1713 Hemoglobin(!): 11.0 Improved compared to prior. [TY]  1731 Basic metabolic panel(!) Consistent with ESRD on dialysis.  No significant metabolic derangements apart from borderline elevated potassium. [TY]  2102 Repositioned patient and cough.  Blood pressure in the 190s systolic [TY]  2112 VAS US DUPLEX DIALYSIS ACCESS (AVF, AVG) Partial versus complete thrombosis.  Consulting vascular surgery. [TY]  2112 PMH significant for ESRD HD MWF, DM1 since age 44, HTN, CVA, h/o bilateral AKA, chronic anemia who is a long-term nursing home resident [TY]    Clinical Course User Index [TY] Coral Spikes, DO   {   Click here for ABCD2, HEART and  other calculatorsREFRESH Note before signing :1}                              Medical Decision Making This is a 42 year old female presenting emergency department for abscess to her right posterior shoulder.  She is afebrile nontachycardic hemodynamically stable.  Physical exam consistent with abscess to her posterior shoulder.  Incision and drainage performed with moderate amount of pus.  Unable to deloculate as patient terminated the procedure as it hurt too much despite copious lidocaine.  She has no fever or tachycardia to suggest systemic infection.  Mild leukocytosis.  Likely secondary to her abscess.  Her potassium is 5.3, however no EKG changes.  She is due for dialysis tomorrow.  Ultrasound preliminary result with possible partial thrombosis?  However was limited study secondary to patient.   Amount and/or Complexity of Data Reviewed External Data Reviewed: notes.    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  Past medical history per chart review significant for ESRD HD MWF, DM1 since age 2, HTN, CVA, h/o bilateral AKA, chronic anemia who is a long-term nursing home resident Labs: ordered. Decision-making details documented in ED Course. Radiology:  Decision-making details documented in ED Course. ECG/medicine tests: ordered.  Risk Prescription drug management. Decision regarding hospitalization. Diagnosis or treatment significantly limited by social determinants of health. Risk Details: Poor health literacy.    {Document critical care time when appropriate:1} {Document review of labs and clinical decision tools ie heart score, Chads2Vasc2 etc:1}  {Document your independent review of radiology images, and any outside records:1} {Document your discussion with family members, caretakers, and with consultants:1} {Document social determinants of health affecting pt's care:1} {Document your decision making why or why not admission, treatments were needed:1} Final Clinical  Impression(s) / ED Diagnoses Final diagnoses:  None    Rx / DC Orders ED Discharge Orders     None

## 2023-03-17 NOTE — Discharge Instructions (Addendum)
Clot aVF-please follow-up with vascular surgery for further management of this. Abscess-I have started on antibiotics please take as prescribed, I recommend keeping the area clean, change the dressings twice daily, apply warm compresses to it.  Please follow with your primary doctor for recheck next 3 to 4 days. Right AC separation-this was seen on your x-ray, likely this is an old injury, you may follow-up with orthopedics as needed.  Come back to the emergency department if you develop chest pain, shortness of breath, severe abdominal pain, uncontrolled nausea, vomiting, diarrhea.

## 2023-03-17 NOTE — Progress Notes (Signed)
RUE dialysis access duplex has been completed.  Difficult and limited study, please see report.  Preliminary findings given to Dr. Maple Hudson.   Results can be found under chart review under CV PROC. 03/17/2023 8:49 PM  RVT, RDMS

## 2023-03-17 NOTE — ED Triage Notes (Signed)
BIB [facility] from [facility] w/ a "boil" on her shoulder that appears tunneled that started hurting her on Wednesday. After dialysis Friday, swelling noted on right arm that became hot to the touch. Pt is a&ox4. Bilat above knee amputee. Dialysis schedule- MWF. Reports to EMS pain when sitting, unsure if ulcers.   EMS reported VS:  126/78 80 16 96% ra 118 CBG  97.5

## 2023-03-18 MED ORDER — CLINDAMYCIN HCL 150 MG PO CAPS: 150.0000 mg | ORAL_CAPSULE | Freq: Four times a day (QID) | ORAL | 0 refills | Status: AC

## 2023-03-18 NOTE — ED Notes (Addendum)
Attempted three times to contact [facility] and was unable to do so. Only option was leave a message or press * for the operator, which pressing * did not do anything. Since I am unsure if the mailbox is secure, I did not leave a message. I will inform [facility]AR.

## 2023-03-28 ENCOUNTER — Ambulatory Visit
Admission: RE | Admit: 2023-03-28 | Discharge: 2023-03-28 | Disposition: A | Discharge status: Home / Self Care | Payer: Medicare Other | Source: Ambulatory Visit

## 2023-03-28 DIAGNOSIS — Z1231 Encounter for screening mammogram for malignant neoplasm of breast: Secondary | ICD-10-CM

## 2023-04-04 ENCOUNTER — Encounter (HOSPITAL_BASED_OUTPATIENT_CLINIC_OR_DEPARTMENT_OTHER): Payer: Medicare Other | Attending: General Surgery | Admitting: General Surgery

## 2023-04-04 DIAGNOSIS — L98492 Non-pressure chronic ulcer of skin of other sites with fat layer exposed: Secondary | ICD-10-CM | POA: Insufficient documentation

## 2023-04-04 DIAGNOSIS — E1122 Type 2 diabetes mellitus with diabetic chronic kidney disease: Secondary | ICD-10-CM | POA: Insufficient documentation

## 2023-04-04 DIAGNOSIS — E11622 Type 2 diabetes mellitus with other skin ulcer: Secondary | ICD-10-CM | POA: Diagnosis not present

## 2023-04-04 DIAGNOSIS — E1151 Type 2 diabetes mellitus with diabetic peripheral angiopathy without gangrene: Secondary | ICD-10-CM | POA: Diagnosis not present

## 2023-04-04 DIAGNOSIS — E46 Unspecified protein-calorie malnutrition: Secondary | ICD-10-CM | POA: Diagnosis not present

## 2023-04-04 DIAGNOSIS — H5461 Unqualified visual loss, right eye, normal vision left eye: Secondary | ICD-10-CM | POA: Insufficient documentation

## 2023-04-04 DIAGNOSIS — Z89611 Acquired absence of right leg above knee: Secondary | ICD-10-CM | POA: Diagnosis not present

## 2023-04-04 DIAGNOSIS — N186 End stage renal disease: Secondary | ICD-10-CM | POA: Insufficient documentation

## 2023-04-04 DIAGNOSIS — Z8673 Personal history of transient ischemic attack (TIA), and cerebral infarction without residual deficits: Secondary | ICD-10-CM | POA: Insufficient documentation

## 2023-04-04 DIAGNOSIS — Z89612 Acquired absence of left leg above knee: Secondary | ICD-10-CM | POA: Insufficient documentation

## 2023-04-04 DIAGNOSIS — Z992 Dependence on renal dialysis: Secondary | ICD-10-CM | POA: Diagnosis not present

## 2023-04-09 NOTE — Progress Notes (Signed)
[name] ([id number]) [id number] Page 1 of 8 Visit Report for 04/04/2023 Chief Complaint Document Details Patient Name: Date of Service: [name] 04/04/2023 8:00 A M Medical Record Number: [id number] Patient Account Number: [id number] Date of Birth/Sex: Treating RN: 11-05-1980 (42 y.o. F) Primary Care Provider: Gailen Shelter Other Clinician: Referring Provider: Treating Provider/Extender: Duanne Guess BRO WN, A MBER Weeks in Treatment: 0 Information Obtained from: Patient Chief Complaint Patient seen for complaints of Non-Healing Wound. Electronic Signature(s) Signed: 04/04/2023 8:58:16 AM By: Duanne Guess MD FACS Previous Signature: 04/04/2023 8:21:00 AM Version By: Duanne Guess MD FACS Entered By: Duanne Guess on 04/04/2023 08:58:16 -------------------------------------------------------------------------------- Debridement Details Patient Name: Date of Service: [name], A LEXIS 04/04/2023 8:00 A M Medical Record Number: [id number] Patient Account Number: [id number] Date of Birth/Sex: Treating RN: 07/27/1981 (42 y.o. F) [name], Tracey Primary Care Provider: Gailen Shelter Other Clinician: Referring Provider: Treating Provider/Extender: Duanne Guess BRO WN, A MBER Weeks in Treatment: 0 Debridement Performed for Assessment: Wound #1 Right,Anterior Shoulder Performed By: Physician Duanne Guess, MD Debridement Type: Debridement Level of Consciousness (Pre-procedure): Awake and Alert Pre-procedure Verification/Time Out Yes - 08:47 Taken: Start Time: 08:48 Pain Control: Lidocaine 5% topical ointment Percent of Wound Bed Debrided: 100% T Area Debrided (cm): otal 0.69 Tissue and other material debrided: Non-Viable, Slough, Slough Level: Non-Viable Tissue Debridement Description: Selective/Open Wound Instrument: Curette Bleeding: Minimum Hemostasis Achieved: Pressure Response to Treatment: Procedure was tolerated well Level  of Consciousness (Post- Awake and Alert procedure): Post Debridement Measurements of Total Wound Length: (cm) 1.1 Width: (cm) 0.8 Depth: (cm) 0.1 Volume: (cm) 0.069 Character of Wound/Ulcer Post Debridement: Requires Further Debridement Post Procedure Diagnosis Same as [name], [name] ([id number]) [id number] Page 2 of 8 Notes Scribed for Dr. Lady Gary by Tracey Ard, RN Electronic Signature(s) Signed: 04/04/2023 12:59:10 PM By: Duanne Guess MD FACS Signed: 04/04/2023 1:05:02 PM By: Tracey Ard RN Entered By: [name] on 04/04/2023 08:49:22 -------------------------------------------------------------------------------- HPI Details Patient Name: Date of Service: [name], A LEXIS 04/04/2023 8:00 A M Medical Record Number: [id number] Patient Account Number: [id number] Date of Birth/Sex: Treating RN: 1981/02/08 (42 y.o. F) Primary Care Provider: Gailen Shelter Other Clinician: Referring Provider: Treating Provider/Extender: Duanne Guess BRO WN, A MBER Weeks in Treatment: 0 History of Present Illness HPI Description: ADMISSION 04/04/2023 This is a 42 year old poorly controlled type II diabetic (last hemoglobin A1c 8.2%) who is a bilateral above-knee amputee, blind in her right eye, with end-stage renal disease on hemodialysis. She presented to the emergency department on August 4 complaining of pain and swelling in her right arm. She was noted to have an abscess on her right posterior shoulder. An attempt was made at incision and drainage, but the site could not be completely evacuated secondary to patient discomfort. She was given a course of clindamycin. I do not see that any culture was obtained at that time. She was referred to the wound care center for further evaluation and management of the site. Electronic Signature(s) Signed: 04/04/2023 8:58:24 AM By: Duanne Guess MD FACS Previous Signature: 04/04/2023 8:25:46 AM Version By:  Duanne Guess MD FACS Entered By: Duanne Guess on 04/04/2023 08:58:24 -------------------------------------------------------------------------------- Physical Exam Details Patient Name: Date of Service: [name], A LEXIS 04/04/2023 8:00 A M Medical Record Number: [id number] Patient Account Number: [id number] Date of Birth/Sex: Treating RN: 1981-02-18 (42 y.o. F) Primary Care Provider: Clovia Cuff D Other Clinician: Referring Provider: Treating Provider/Extender: Duanne Guess BRO WN, A MBER Weeks in Treatment: 0 Constitutional . . . Marland Kitchen  No acute distress. Respiratory Normal work of breathing on room air. Notes 04/04/2023: On her posterior right shoulder, there is an oval wound consistent with an abscess that has previously been incised and drained. There is some slough on the surface with some granulation tissue beginning to form. There is no surrounding erythema or induration. No purulent drainage or malodor to suggest infection. Electronic Signature(s) Signed: 04/04/2023 8:59:57 AM By: Duanne Guess MD FACS Entered By: Duanne Guess on 04/04/2023 08:59:57 [name] ([id number]) [id number] Page 3 of 8 -------------------------------------------------------------------------------- Physician Orders Details Patient Name: Date of Service: [name] 04/04/2023 8:00 A M Medical Record Number: [id number] Patient Account Number: [id number] Date of Birth/Sex: Treating RN: 19-Nov-1980 (42 y.o. F) [name], Tracey Primary Care Provider: Clovia Cuff D Other Clinician: Referring Provider: Treating Provider/Extender: Duanne Guess BRO WN, A MBER Weeks in Treatment: 0 Verbal / Phone Orders: No Diagnosis Coding ICD-10 Coding Code Description L98.492 Non-pressure chronic ulcer of skin of other sites with fat layer exposed L02.413 Cutaneous abscess of right upper limb E11.622 Type 2 diabetes mellitus with other skin ulcer I73.9 Peripheral vascular  disease, unspecified N18.6 End stage renal disease E46 Unspecified protein-calorie malnutrition Z89.611 Acquired absence of right leg above knee Z89.612 Acquired absence of left leg above knee Follow-up Appointments ppointment in 1 week. - Dr. Lady Gary Rm 3 Return A Anesthetic (In clinic) Topical Lidocaine 5% applied to wound bed - as needed for debridement Bathing/ Shower/ Hygiene May shower with protection but do not get wound dressing(s) wet. Protect dressing(s) with water repellant cover (for example, large plastic bag) or a cast cover and may then take shower. - Change dressing daily; may shower daily and then change dressing Wound Treatment Wound #1 - Shoulder Wound Laterality: Right, Anterior Cleanser: Soap and Water 1 x Per Day/30 Days Discharge Instructions: May shower and wash wound with dial antibacterial soap and water prior to dressing change. Peri-Wound Care: Skin Prep (DME) (Generic) 1 x Per Day/30 Days Discharge Instructions: Use skin prep as directed Prim Dressing: MediHoney Gel, tube 1.5 (oz) 1 x Per Day/30 Days ary Discharge Instructions: Apply to wound bed as instructed Secondary Dressing: Zetuvit Plus Silicone Border Dressing 4x4 (in/in) (DME) (Generic) 1 x Per Day/30 Days Discharge Instructions: Apply silicone border over primary dressing as directed. Electronic Signature(s) Signed: 04/04/2023 9:22:19 AM By: Tracey Ard RN Signed: 04/04/2023 12:59:10 PM By: Duanne Guess MD FACS Previous Signature: 04/04/2023 9:00:22 AM Version By: Duanne Guess MD FACS Entered By: [name] on 04/04/2023 09:22:19 -------------------------------------------------------------------------------- Problem List Details Patient Name: Date of Service: [name], A LEXIS 04/04/2023 8:00 A Herminio Commons, [name] ([id number]) [id number] Page 4 of 8 Medical Record Number: [id number] Patient Account Number: [id number] Date of Birth/Sex: Treating RN: January 28, 1981 (42  y.o. F) Primary Care Provider: Clovia Cuff D Other Clinician: Referring Provider: Treating Provider/Extender: Duanne Guess BRO WN, A MBER Weeks in Treatment: 0 Active Problems ICD-10 Encounter Code Description Active Date MDM Diagnosis L98.492 Non-pressure chronic ulcer of skin of other sites with fat layer exposed 04/04/2023 No Yes L02.413 Cutaneous abscess of right upper limb 04/04/2023 No Yes E11.622 Type 2 diabetes mellitus with other skin ulcer 04/04/2023 No Yes I73.9 Peripheral vascular disease, unspecified 04/04/2023 No Yes N18.6 End stage renal disease 04/04/2023 No Yes E46 Unspecified protein-calorie malnutrition 04/04/2023 No Yes Z89.611 Acquired absence of right leg above knee 04/04/2023 No Yes Z89.612 Acquired absence of left leg above knee 04/04/2023 No Yes Inactive Problems Resolved Problems Electronic Signature(s) Signed: 04/04/2023 8:20:33 AM By: Duanne Guess  MD FACS Previous Signature: 04/04/2023 8:00:22 AM Version By: Duanne Guess MD FACS Entered By: Duanne Guess on 04/04/2023 08:20:33 -------------------------------------------------------------------------------- Progress Note Details Patient Name: Date of Service: [name], A LEXIS 04/04/2023 8:00 A M Medical Record Number: [id number] Patient Account Number: [id number] Date of Birth/Sex: Treating RN: 08-Nov-1980 (42 y.o. F) Primary Care Provider: Gailen Shelter Other Clinician: Referring Provider: Treating Provider/Extender: Duanne Guess BRO WN, A MBER Weeks in Treatment: 0 Subjective Chief Complaint Information obtained from Patient [name], [name] ([id number]) [id number] Page 5 of 8 Patient seen for complaints of Non-Healing Wound. History of Present Illness (HPI) ADMISSION 04/04/2023 This is a 42 year old poorly controlled type II diabetic (last hemoglobin A1c 8.2%) who is a bilateral above-knee amputee, blind in her right eye, with end-stage renal disease on hemodialysis.  She presented to the emergency department on August 4 complaining of pain and swelling in her right arm. She was noted to have an abscess on her right posterior shoulder. An attempt was made at incision and drainage, but the site could not be completely evacuated secondary to patient discomfort. She was given a course of clindamycin. I do not see that any culture was obtained at that time. She was referred to the wound care center for further evaluation and management of the site. Patient History Allergies morphine, peanut, Shellfish Containing Products, chocolate Medical History Endocrine Patient has history of Type II Diabetes Genitourinary Patient has history of End Stage Renal Disease Medical A Surgical History Notes nd Eyes Blind in right eye Musculoskeletal Guillain Barre Neurologic CVA Psychiatric Anxiety Objective Constitutional No acute distress. Vitals Time Taken: 8:00 AM, Height: 61 in, Temperature: 98.7 F, Pulse: 76 bpm, Respiratory Rate: 18 breaths/min, Blood Pressure: 124/85 mmHg, Capillary Blood Glucose: 185 mg/dl. Respiratory Normal work of breathing on room air. General Notes: 04/04/2023: On her posterior right shoulder, there is an oval wound consistent with an abscess that has previously been incised and drained. There is some slough on the surface with some granulation tissue beginning to form. There is no surrounding erythema or induration. No purulent drainage or malodor to suggest infection. Integumentary (Hair, Skin) Wound #1 status is Open. Original cause of wound was Gradually Appeared. The date acquired was: 03/28/2023. The wound is located on the Right,Anterior Shoulder. The wound measures 1.1cm length x 0.8cm width x 0.1cm depth; 0.691cm^2 area and 0.069cm^3 volume. There is Fat Layer (Subcutaneous Tissue) exposed. There is no tunneling or undermining noted. There is a medium amount of serosanguineous drainage noted. The wound margin is distinct with  the outline attached to the wound base. There is medium (34-66%) red, pink granulation within the wound bed. There is a medium (34-66%) amount of necrotic tissue within the wound bed including Adherent Slough. The periwound skin appearance had no abnormalities noted for texture. The periwound skin appearance had no abnormalities noted for color. The periwound skin appearance did not exhibit: Dry/Scaly, Maceration. Assessment Active Problems ICD-10 Non-pressure chronic ulcer of skin of other sites with fat layer exposed Cutaneous abscess of right upper limb Type 2 diabetes mellitus with other skin ulcer Peripheral vascular disease, unspecified End stage renal disease Unspecified protein-calorie malnutrition Acquired absence of right leg above knee Acquired absence of left leg above knee [name] ([id number]) [phone].pdf Page 6 of 8 Procedures Wound #1 Pre-procedure diagnosis of Wound #1 is a Cyst located on the Right,Anterior Shoulder . There was a Selective/Open Wound Non-Viable Tissue Debridement with a total area of 0.69 sq cm performed by Duanne Guess, MD. With the  following instrument(s): Curette to remove Non-Viable tissue/material. Material removed includes Slough after achieving pain control using Lidocaine 5% topical ointment. No specimens were taken. A time out was conducted at 08:47, prior to the start of the procedure. A Minimum amount of bleeding was controlled with Pressure. The procedure was tolerated well. Post Debridement Measurements: 1.1cm length x 0.8cm width x 0.1cm depth; 0.069cm^3 volume. Character of Wound/Ulcer Post Debridement requires further debridement. Post procedure Diagnosis Wound #1: Same as Pre-Procedure General Notes: Scribed for Dr. Lady Gary by Tracey Ard, RN. Plan Follow-up Appointments: Return Appointment in 1 week. - Dr. Lady Gary Rm 3 Anesthetic: (In clinic) Topical Lidocaine 5% applied to wound bed - as needed for  debridement Bathing/ Shower/ Hygiene: May shower with protection but do not get wound dressing(s) wet. Protect dressing(s) with water repellant cover (for example, large plastic bag) or a cast cover and may then take shower. - Change dressing daily; may shower daily and then change dressing WOUND #1: - Shoulder Wound Laterality: Right, Anterior Cleanser: Soap and Water 1 x Per Day/30 Days Discharge Instructions: May shower and wash wound with dial antibacterial soap and water prior to dressing change. Peri-Wound Care: Skin Prep (DME) (Generic) 1 x Per Day/30 Days Discharge Instructions: Use skin prep as directed Prim Dressing: MediHoney Gel, tube 1.5 (oz) 1 x Per Day/30 Days ary Discharge Instructions: Apply to wound bed as instructed Secondary Dressing: Zetuvit Plus Silicone Border Dressing 4x4 (in/in) (DME) (Generic) 1 x Per Day/30 Days Discharge Instructions: Apply silicone border over primary dressing as directed. 04/04/2023: This is a 42 year old diabetic who had an abscess on her shoulder that was treated initially in the emergency department. She has completed her course of oral clindamycin. On her posterior right shoulder, there is an oval wound consistent with an abscess that has previously been incised and drained. There is some slough on the surface with some granulation tissue beginning to form. There is no surrounding erythema or induration. No purulent drainage or malodor to suggest infection. I used a curette to debride slough from the wound surface. I think the wound would benefit from additional chemical debridement during the week between visits. We will use Medihoney under a bordered gauze dressing, change daily. Follow-up in 1 week. Electronic Signature(s) Signed: 04/04/2023 9:23:59 AM By: Tracey Ard RN Signed: 04/04/2023 12:59:10 PM By: Duanne Guess MD FACS Previous Signature: 04/04/2023 9:01:35 AM Version By: Duanne Guess MD FACS Entered By: [name] on  04/04/2023 09:23:58 -------------------------------------------------------------------------------- HxROS Details Patient Name: Date of Service: [name], A LEXIS 04/04/2023 8:00 A M Medical Record Number: [id number] Patient Account Number: [id number] Date of Birth/Sex: Treating RN: [birth year] (41 y.o. Gevena Mart Primary Care Provider: Clovia Cuff D Other Clinician: Referring Provider: Treating Provider/Extender: Duanne Guess BRO WN, A MBER Weeks in Treatment: 0 Eyes Medical History: Past Medical History Notes: Blind in right eye Endocrine Medical History: Positive for: Type II Diabetes CEDRA, ETSITTY ([id number]) [phone].pdf Page 7 of 8 Genitourinary Medical History: Positive for: End Stage Renal Disease Musculoskeletal Medical History: Past Medical History Notes: Guillain Barre Neurologic Medical History: Past Medical History Notes: CVA Psychiatric Medical History: Past Medical History Notes: Anxiety Immunizations Implantable Devices No devices added Electronic Signature(s) Signed: 04/04/2023 12:59:10 PM By: Duanne Guess MD FACS Signed: 04/09/2023 2:02:59 PM By: Brenton Grills Entered By: Brenton Grills on 04/03/2023 12:25:58 -------------------------------------------------------------------------------- SuperBill Details Patient Name: Date of Service: [name], [name] 04/04/2023 Medical Record Number: [id number] Patient Account Number: [id number] Date of Birth/Sex: Treating RN: [birth year] (42 y.o.  F) Primary Care Provider: Clovia Cuff D Other Clinician: Referring Provider: Treating Provider/Extender: Duanne Guess BRO WN, A MBER Weeks in Treatment: 0 Diagnosis Coding ICD-10 Codes Code Description [phone] Non-pressure chronic ulcer of skin of other sites with fat layer exposed L02.413 Cutaneous abscess of right upper limb E11.622 Type 2 diabetes mellitus with other skin ulcer I73.9 Peripheral vascular disease,  unspecified N18.6 End stage renal disease E46 Unspecified protein-calorie malnutrition Z89.611 Acquired absence of right leg above knee Z89.612 Acquired absence of left leg above knee Facility Procedures : CPT4 Code: [id number] Description: 99213 - WOUND CARE VISIT-LEV 3 EST PT Modifier: 25 Quantity: 1 : ARNETTE, PELLO Code: [id number] Keeanna ([id number]) Description: 97597 - DEBRIDE WOUND 1ST 20 SQ CM OR < ICD-10 Diagnosis Description [id number] L98.492 Non-pressure chronic ulcer of skin of other sites with fat layer exposed Modifier: [id number] Quantity: 1 [id number] Page 8 of 8 Physician Procedures : CPT4 Code Description Modifier [id number] [phone] - WC PHYS LEVEL 4 - NEW PT 25 ICD-10 Diagnosis Description L98.492 Non-pressure chronic ulcer of skin of other sites with fat layer exposed L02.413 Cutaneous abscess of right upper limb E11.622 Type 2  diabetes mellitus with other skin ulcer N18.6 End stage renal disease Quantity: 1 : [id number] 97597 - WC PHYS DEBR WO ANESTH 20 SQ CM ICD-10 Diagnosis Description L98.492 Non-pressure chronic ulcer of skin of other sites with fat layer exposed Quantity: 1 Electronic Signature(s) Signed: 04/04/2023 9:23:37 AM By: Tracey Ard RN Signed: 04/04/2023 12:59:10 PM By: Duanne Guess MD FACS Previous Signature: 04/04/2023 9:02:10 AM Version By: Duanne Guess MD FACS Entered By: [name] on 04/04/2023 09:23:37

## 2023-04-09 NOTE — Progress Notes (Signed)
**Note [name]-Identified via Obfuscation** KIRSI, KALATA ([id number]) [id number] Page 1 of 8 Visit Report for 04/04/2023 Allergy List Details Patient Name: Date of Service: [name] 04/04/2023 8:00 A M Medical Record Number: [id number] Patient Account Number: [id number] Date of Birth/Sex: Treating RN: May 20, 1981 (42 y.o. [name] Primary Care Arsen Mangione: Clovia Cuff D Other Clinician: Referring Burel Kahre: Treating Terran Hollenkamp/Extender: Duanne Guess BRO WN, A MBER Weeks in Treatment: 0 Allergies Active Allergies morphine peanut Shellfish Containing Products chocolate Allergy Notes Electronic Signature(s) Signed: 04/09/2023 2:02:59 PM By: Brenton Grills Entered By: Brenton Grills on 04/03/2023 12:23:46 -------------------------------------------------------------------------------- Arrival Information Details Patient Name: Date of Service: [name], A LEXIS 04/04/2023 8:00 A M Medical Record Number: [id number] Patient Account Number: [id number] Date of Birth/Sex: Treating RN: 1980/09/07 (42 y.o. [name] Primary Care Justyn Langham: Clovia Cuff D Other Clinician: Referring Debra Colon: Treating Deondria Puryear/Extender: Duanne Guess BRO WN, A MBER Weeks in Treatment: 0 Visit Information Patient Arrived: [name] Arrival Time: 08:10 Accompanied By: self Transfer Assistance: Hoyer Lift Patient Identification Verified: Yes Secondary Verification Process Completed: Yes Patient Requires Transmission-Based Precautions: No Patient Has Alerts: No Electronic Signature(s) Signed: 04/09/2023 2:02:59 PM By: Brenton Grills Entered By: Brenton Grills on 04/04/2023 08:11:06 Rene Paci ([id number]) [id number] Page 2 of 8 -------------------------------------------------------------------------------- Clinic Level of Care Assessment Details Patient Name: Date of Service: [name] 04/04/2023 8:00 A M Medical Record Number: [id number] Patient Account Number:  [id number] Date of Birth/Sex: Treating RN: 1981/01/17 (42 y.o. F) Zochol, Jamie Primary Care Williom Cedar: Gailen Shelter Other Clinician: Referring Milferd Ansell: Treating Miela Desjardin/Extender: Duanne Guess BRO WN, A MBER Weeks in Treatment: 0 Clinic Level of Care Assessment Items TOOL 1 Quantity Score X- 1 0 Use when EandM and Procedure is performed on INITIAL visit ASSESSMENTS - Nursing Assessment / Reassessment X- 1 20 General Physical Exam (combine w/ comprehensive assessment (listed just below) when performed on new pt. evals) X- 1 25 Comprehensive Assessment (HX, ROS, Risk Assessments, Wounds Hx, etc.) ASSESSMENTS - Wound and Skin Assessment / Reassessment X- 1 10 Dermatologic / Skin Assessment (not related to wound area) ASSESSMENTS - Ostomy and/or Continence Assessment and Care []  - 0 Incontinence Assessment and Management []  - 0 Ostomy Care Assessment and Management (repouching, etc.) PROCESS - Coordination of Care X - Simple Patient / Family Education for ongoing care 1 15 []  - 0 Complex (extensive) Patient / Family Education for ongoing care X- 1 10 Staff obtains Chiropractor, Records, T Results / Process Orders est X- 1 10 Staff telephones HHA, Nursing Homes / Clarify orders / etc []  - 0 Routine Transfer to another Facility (non-emergent condition) []  - 0 Routine Hospital Admission (non-emergent condition) X- 1 15 New Admissions / Manufacturing engineer / Ordering NPWT Apligraf, etc. , []  - 0 Emergency Hospital Admission (emergent condition) PROCESS - Special Needs []  - 0 Pediatric / Minor Patient Management []  - 0 Isolation Patient Management []  - 0 Hearing / Language / Visual special needs []  - 0 Assessment of Community assistance (transportation, D/C planning, etc.) []  - 0 Additional assistance / Altered mentation []  - 0 Support Surface(s) Assessment (bed, cushion, seat, etc.) INTERVENTIONS - Miscellaneous []  - 0 External ear exam []  - 0 Patient Transfer  (multiple staff / Nurse, adult / Similar devices) []  - 0 Simple Staple / Suture removal (25 or less) []  - 0 Complex Staple / Suture removal (26 or more) []  - 0 Hypo/Hyperglycemic Management (do not check if billed separately) []  - 0 Ankle / Brachial Index (ABI) - do not check if billed  separately Has the patient been seen at the hospital within the last three years: Yes Total Score: 105 Level Of Care: New/Established - Level 3 Electronic Signature(s) Signed: 04/04/2023 1:05:02 PM By: Tommie Ard RN [name],Signed: 04/04/2023 1:05:02 PM By: Tommie Ard RN Jon Gills ([id number]) [id number] Page 3 of 8 Entered By: Tommie Ard on 04/04/2023 09:23:23 -------------------------------------------------------------------------------- Encounter Discharge Information Details Patient Name: Date of Service: [name] 04/04/2023 8:00 A M Medical Record Number: [id number] Patient Account Number: [id number] Date of Birth/Sex: Treating RN: Jul 10, 1981 (42 y.o. F) Zochol, Jamie Primary Care Kayanna Mckillop: Gailen Shelter Other Clinician: Referring Calee Nugent: Treating Gail Creekmore/Extender: Duanne Guess BRO WN, A MBER Weeks in Treatment: 0 Encounter Discharge Information Items Post Procedure Vitals Discharge Condition: Stable Temperature (F): 98.7 Ambulatory Status: Wheelchair Pulse (bpm): 76 Discharge Destination: Skilled Nursing Facility Respiratory Rate (breaths/min): 18 Telephoned: No Blood Pressure (mmHg): 124/85 Orders Sent: Yes Transportation: Private Auto Accompanied By: self Schedule Follow-up Appointment: Yes Clinical Summary of Care: Electronic Signature(s) Signed: 04/04/2023 9:25:00 AM By: Tommie Ard RN Entered By: Tommie Ard on 04/04/2023 09:24:59 -------------------------------------------------------------------------------- Lower Extremity Assessment Details Patient Name: Date of Service: [name] 04/04/2023 8:00 A M Medical Record Number:  [id number] Patient Account Number: [id number] Date of Birth/Sex: Treating RN: 1980/12/29 (42 y.o. [name] Primary Care Teddy Rebstock: Clovia Cuff D Other Clinician: Referring Niko Jakel: Treating Nicoletta Hush/Extender: Duanne Guess BRO WN, A MBER Weeks in Treatment: 0 Electronic Signature(s) Signed: 04/09/2023 2:02:59 PM By: Brenton Grills Entered By: Brenton Grills on 04/04/2023 08:14:37 -------------------------------------------------------------------------------- Multi Wound Chart Details Patient Name: Date of Service: [name], A LEXIS 04/04/2023 8:00 A M Medical Record Number: [id number] Patient Account Number: [id number] Date of Birth/Sex: Treating RN: [birth year] (42 y.o. F) Primary Care Shamanda Len: Gailen Shelter Other Clinician: Referring Istvan Behar: Treating Julie-Ann Vanmaanen/Extender: Duanne Guess BRO WN, A MBER Weeks in Treatment: 0 Vital Signs [name], [name] ([id number]) [id number] Page 4 of 8 Height(in): 61 Capillary Blood Glucose(mg/dl): 536 Weight(lbs): Pulse(bpm): 76 Body Mass Index(BMI): Blood Pressure(mmHg): 124/85 Temperature(F): 98.7 Respiratory Rate(breaths/min): 18 [1:Photos:] [N/A:N/A] Right, Anterior Shoulder N/A N/A Wound Location: Gradually Appeared N/A N/A Wounding Event: Cyst N/A N/A Primary Etiology: Type II Diabetes, End Stage Renal N/A N/A Comorbid History: Disease 03/28/2023 N/A N/A Date Acquired: 0 N/A N/A Weeks of Treatment: Open N/A N/A Wound Status: No N/A N/A Wound Recurrence: 1.1x0.8x0.1 N/A N/A Measurements L x W x D (cm) 0.691 N/A N/A A (cm) : rea 0.069 N/A N/A Volume (cm) : Full Thickness Without Exposed N/A N/A Classification: Support Structures Medium N/A N/A Exudate A mount: Serosanguineous N/A N/A Exudate Type: red, brown N/A N/A Exudate Color: Distinct, outline attached N/A N/A Wound Margin: Medium (34-66%) N/A N/A Granulation A mount: Red, Pink N/A N/A Granulation Quality: Medium (34-66%)  N/A N/A Necrotic A mount: Fat Layer (Subcutaneous Tissue): Yes N/A N/A Exposed Structures: Debridement - Selective/Open Wound N/A N/A Debridement: Pre-procedure Verification/Time Out 08:47 N/A N/A Taken: Lidocaine 5% topical ointment N/A N/A Pain Control: Slough N/A N/A Tissue Debrided: Non-Viable Tissue N/A N/A Level: 0.69 N/A N/A Debridement A (sq cm): rea Curette N/A N/A Instrument: Minimum N/A N/A Bleeding: Pressure N/A N/A Hemostasis A chieved: Procedure was tolerated well N/A N/A Debridement Treatment Response: 1.1x0.8x0.1 N/A N/A Post Debridement Measurements L x W x D (cm) 0.069 N/A N/A Post Debridement Volume: (cm) No Abnormalities Noted N/A N/A Periwound Skin Texture: Maceration: No N/A N/A Periwound Skin Moisture: Dry/Scaly: No No Abnormalities Noted N/A N/A Periwound Skin Color: Debridement N/A N/A Procedures Performed: Treatment Notes Electronic  Signature(s) Signed: 04/04/2023 8:58:09 AM By: Duanne Guess MD FACS Previous Signature: 04/04/2023 8:20:46 AM Version By: Duanne Guess MD FACS Entered By: Duanne Guess on 04/04/2023 08:58:09 -------------------------------------------------------------------------------- Multi-Disciplinary Care Plan Details Patient Name: Date of Service: [name], A LEXIS 04/04/2023 8:00 A M Medical Record Number: [id number] Patient Account Number: [id number] [name], [name] ([id number]) [id number].pdf Page 5 of 8 Date of Birth/Sex: Treating RN: Dec 09, 1980 (43 y.o. F) Zochol, Jamie Primary Care Deniece Rankin: Other Clinician: Clovia Cuff D Referring Legrande Hao: Treating Dorthula Bier/Extender: Duanne Guess BRO WN, A MBER Weeks in Treatment: 0 Active Inactive Orientation to the Wound Care Program Nursing Diagnoses: Knowledge deficit related to the wound healing center program Goals: Patient/caregiver will verbalize understanding of the Wound Healing Center Program Date Initiated: 04/04/2023 Target  Resolution Date: 05/23/2023 Goal Status: Active Interventions: Provide education on orientation to the wound center Notes: Wound/Skin Impairment Nursing Diagnoses: Knowledge deficit related to ulceration/compromised skin integrity Goals: Ulcer/skin breakdown will have a volume reduction of 30% by week 4 Date Initiated: 04/04/2023 Target Resolution Date: 05/05/2023 Goal Status: Active Interventions: Assess patient/caregiver ability to obtain necessary supplies Assess patient/caregiver ability to perform ulcer/skin care regimen upon admission and as needed Assess ulceration(s) every visit Notes: Electronic Signature(s) Signed: 04/04/2023 9:19:43 AM By: Tommie Ard RN Entered By: Tommie Ard on 04/04/2023 09:19:43 -------------------------------------------------------------------------------- Pain Assessment Details Patient Name: Date of Service: [name] 04/04/2023 8:00 A M Medical Record Number: [id number] Patient Account Number: [id number] Date of Birth/Sex: Treating RN: 10/19/1980 (42 y.o. [name] Primary Care Alija Riano: Clovia Cuff D Other Clinician: Referring Yahia Bottger: Treating Jamaiyah Pyle/Extender: Duanne Guess BRO WN, A MBER Weeks in Treatment: 0 Active Problems Location of Pain Severity and Description of Pain Patient Has Paino No Site Locations Irvona, Jon Gills ([id number]) [id number] Page 6 of 8 Pain Management and Medication Current Pain Management: Electronic Signature(s) Signed: 04/09/2023 2:02:59 PM By: Brenton Grills Entered By: Brenton Grills on 04/04/2023 59:56:38 -------------------------------------------------------------------------------- Patient/Caregiver Education Details Patient Name: Date of Service: [name], A LEXIS 8/22/2024andnbsp8:00 A M Medical Record Number: [id number] Patient Account Number: [id number] Date of Birth/Gender: Treating RN: [birth year] (42 y.o. F) Zochol, Jamie Primary Care Physician: Clovia Cuff D Other Clinician: Referring Physician: Treating Physician/Extender: Duanne Guess BRO WN, A MBER Weeks in Treatment: 0 Education Assessment Education Provided To: Patient Education Topics Provided Welcome T [facility]New Patient Packet: o Methods: Explain/Verbal Responses: Reinforcements needed, State content correctly Wound Debridement: Methods: Explain/Verbal Responses: Reinforcements needed, State content correctly Wound/Skin Impairment: Methods: Explain/Verbal Responses: Reinforcements needed, State content correctly Electronic Signature(s) Signed: 04/04/2023 1:05:02 PM By: Tommie Ard RN Entered By: Tommie Ard on 04/04/2023 09:20:07 Rene Paci ([id number]) [id number] Page 7 of 8 -------------------------------------------------------------------------------- Wound Assessment Details Patient Name: Date of Service: [name] 04/04/2023 8:00 A M Medical Record Number: [id number] Patient Account Number: [id number] Date of Birth/Sex: Treating RN: [birth year] (42 y.o. [name] Primary Care Gilford Lardizabal: Clovia Cuff D Other Clinician: Referring Alfredo Spong: Treating Tylyn Derwin/Extender: Duanne Guess BRO WN, A MBER Weeks in Treatment: 0 Wound Status Wound Number: 1 Primary Etiology: Cyst Wound Location: Right, Anterior Shoulder Wound Status: Open Wounding Event: Gradually Appeared Comorbid History: Type II Diabetes, End Stage Renal Disease Date Acquired: 03/28/2023 Weeks Of Treatment: 0 Clustered Wound: No Photos Wound Measurements Length: (cm) 1.1 Width: (cm) 0.8 Depth: (cm) 0.1 Area: (cm) 0.691 Volume: (cm) 0.069 % Reduction in Area: % Reduction in Volume: Tunneling: No Undermining: No Wound Description Classification: Full Thickness Without Exposed Support Structures Wound Margin: Distinct, outline attached Exudate Amount:  Medium Exudate Type: Serosanguineous Exudate Color: red, brown Foul Odor  After Cleansing: No Slough/Fibrino Yes Wound Bed Granulation Amount: Medium (34-66%) Exposed Structure Granulation Quality: Red, Pink Fat Layer (Subcutaneous Tissue) Exposed: Yes Necrotic Amount: Medium (34-66%) Necrotic Quality: Adherent Slough Periwound Skin Texture Texture Color No Abnormalities Noted: Yes No Abnormalities Noted: Yes Moisture No Abnormalities Noted: No Dry / Scaly: No Maceration: No Treatment Notes Wound #1 (Shoulder) Wound Laterality: Right, Anterior Cleanser Soap and Water Discharge Instruction: May shower and wash wound with dial antibacterial soap and water prior to dressing change. [name], [name] ([id number]) [id number] Page 8 of 8 Peri-Wound Care Skin Prep Discharge Instruction: Use skin prep as directed Topical Primary Dressing MediHoney Gel, tube 1.5 (oz) Discharge Instruction: Apply to wound bed as instructed Secondary Dressing Zetuvit Plus Silicone Border Dressing 4x4 (in/in) Discharge Instruction: Apply silicone border over primary dressing as directed. Secured With Compression Wrap Compression Stockings Facilities manager) Signed: 04/09/2023 2:02:59 PM By: Brenton Grills Entered By: Brenton Grills on 04/04/2023 08:21:38 -------------------------------------------------------------------------------- Vitals Details Patient Name: Date of Service: [name], A LEXIS 04/04/2023 8:00 A M Medical Record Number: [id number] Patient Account Number: [id number] Date of Birth/Sex: Treating RN: 10-27-1980 (42 y.o. [name] Primary Care Coralynn Gaona: Clovia Cuff D Other Clinician: Referring Deeanne Deininger: Treating Sunya Humbarger/Extender: Duanne Guess BRO WN, A MBER Weeks in Treatment: 0 Vital Signs Time Taken: 08:00 Temperature (F): 98.7 Height (in): 61 Pulse (bpm): 76 Respiratory Rate (breaths/min): 18 Blood Pressure (mmHg): 124/85 Capillary Blood Glucose (mg/dl): 086 Reference Range: 80 - 120 mg /  dl Electronic Signature(s) Signed: 04/09/2023 2:02:59 PM By: Brenton Grills Entered By: Brenton Grills on 04/04/2023 08:12:06

## 2023-04-09 NOTE — Progress Notes (Signed)
ANFAL, FIORITO ([id number]) [id number] Page 1 of 4 Visit Report for 04/04/2023 Abuse Risk Screen Details Patient Name: Date of Service: [name] 04/04/2023 8:00 A M Medical Record Number: [id number] Patient Account Number: [id number] Date of Birth/Sex: Treating RN: [birth year] (42 y.o. Gevena Mart Primary Care Grae Leathers: Clovia Cuff D Other Clinician: Referring Juvencio Verdi: Treating Kaeden Mester/Extender: Duanne Guess BRO WN, A MBER Weeks in Treatment: 0 Abuse Risk Screen Items Answer ABUSE RISK SCREEN: Has anyone close to you tried to hurt or harm you recentlyo No Do you feel uncomfortable with anyone in your familyo No Has anyone forced you do things that you didnt want to doo No Electronic Signature(s) Signed: 04/09/2023 2:02:59 PM By: Brenton Grills Entered By: Brenton Grills on 04/04/2023 08:12:29 -------------------------------------------------------------------------------- Activities of Daily Living Details Patient Name: Date of Service: [name] 04/04/2023 8:00 A M Medical Record Number: [id number] Patient Account Number: [id number] Date of Birth/Sex: Treating RN: 10-15-1980 (42 y.o. Gevena Mart Primary Care Kendarious Gudino: Clovia Cuff D Other Clinician: Referring Tigerlily Christine: Treating Jonathen Rathman/Extender: Duanne Guess BRO WN, A MBER Weeks in Treatment: 0 Activities of Daily Living Items Answer Activities of Daily Living (Please select one for each item) Drive Automobile Not Able T Medications ake Need Assistance Use T elephone Need Assistance Care for Appearance Need Assistance Use T oilet Need Assistance Bath / Shower Need Assistance Dress Self Need Assistance Feed Self Need Assistance Walk Need Assistance Get In / Out Bed Need Assistance Housework Need Assistance Prepare Meals Need Assistance Handle Money Need Assistance Shop for Self Need Assistance Electronic Signature(s) Signed: 04/09/2023 2:02:59 PM By: Brenton Grills Entered By: Brenton Grills on 04/04/2023 08:13:07 Rene Paci ([id number]) [id number] Page 2 of 4 -------------------------------------------------------------------------------- Education Screening Details Patient Name: Date of Service: [name] 04/04/2023 8:00 A M Medical Record Number: [id number] Patient Account Number: [id number] Date of Birth/Sex: Treating RN: 01-08-1981 (42 y.o. Gevena Mart Primary Care Sejal Cofield: Clovia Cuff D Other Clinician: Referring Ariya Bohannon: Treating Merlean Pizzini/Extender: Duanne Guess BRO WN, A MBER Weeks in Treatment: 0 Learning Preferences/Education Level/Primary Language Highest Education Level: High School Preferred Language: English Cognitive Barrier Language Barrier: No Translator Needed: No Memory Deficit: No Emotional Barrier: No Cultural/Religious Beliefs Affecting Medical Care: No Physical Barrier Impaired Vision: No Impaired Hearing: No Decreased Hand dexterity: No Knowledge/Comprehension Knowledge Level: Medium Comprehension Level: Medium Ability to understand written instructions: Medium Ability to understand verbal instructions: Medium Motivation Anxiety Level: Calm Cooperation: Cooperative Education Importance: Acknowledges Need Interest in Health Problems: Uninterested Perception: Coherent Willingness to Engage in Self-Management Medium Activities: Readiness to Engage in Self-Management Medium Activities: Electronic Signature(s) Signed: 04/09/2023 2:02:59 PM By: Brenton Grills Entered By: Brenton Grills on 04/04/2023 08:13:59 -------------------------------------------------------------------------------- Fall Risk Assessment Details Patient Name: Date of Service: Berneice Gandy, A LEXIS 04/04/2023 8:00 A M Medical Record Number: [id number] Patient Account Number: [id number] Date of Birth/Sex: Treating RN: 22-Oct-1980 (42 y.o. Gevena Mart Primary Care Keora Eccleston: Clovia Cuff D  Other Clinician: Referring Nirvi Boehler: Treating Mikko Lewellen/Extender: Duanne Guess BRO WN, A MBER Weeks in Treatment: 0 Fall Risk Assessment Items Have you had 2 or more falls in the last 12 monthso 0 No Have you had any fall that resulted in injury in the last 12 monthso 0 No FALLS RISK GALILEAH DUNLAVEY, Nahiara ([id number]) [phone] [id number] Page 3 of 4 History of falling - immediate or within 3 months 0 No Secondary diagnosis (Do you have 2 or more medical diagnoseso) 0 No Ambulatory aid None/bed rest/wheelchair/nurse 0 No Crutches/cane/walker  0 No Furniture 0 No Intravenous therapy Access/Saline/Heparin Lock 0 No Gait/Transferring Normal/ bed rest/ wheelchair 0 No Weak (short steps with or without shuffle, stooped but able to lift head while walking, may seek 0 No support from furniture) Impaired (short steps with shuffle, may have difficulty arising from chair, head down, impaired 0 No balance) Mental Status Oriented to own ability 0 No Electronic Signature(s) Signed: 04/09/2023 2:02:59 PM By: Brenton Grills Entered By: Brenton Grills on 04/04/2023 08:14:08 -------------------------------------------------------------------------------- Foot Assessment Details Patient Name: Date of Service: Berneice Gandy, A LEXIS 04/04/2023 8:00 A M Medical Record Number: [id number] Patient Account Number: [id number] Date of Birth/Sex: Treating RN: 05/05/1981 (42 y.o. Gevena Mart Primary Care Thinh Cuccaro: Clovia Cuff D Other Clinician: Referring Samaria Anes: Treating Luverta Korte/Extender: Duanne Guess BRO WN, A MBER Weeks in Treatment: 0 Foot Assessment Items Site Locations + = Sensation present, - = Sensation absent, C = Callus, U = Ulcer R = Redness, W = Warmth, M = Maceration, PU = Pre-ulcerative lesion F = Fissure, S = Swelling, D = Dryness Assessment Right: Left: Other Deformity: No No Prior Foot Ulcer: No No Prior Amputation: No No Charcot Joint: No  No Ambulatory Status: Non-ambulatory Assistance Device: Wheelchair GaitSKYA, REDIC ([id number]) [phone].pdf Page 4 of 4 Electronic Signature(s) Signed: 04/09/2023 2:02:59 PM By: Brenton Grills Entered By: Brenton Grills on 04/04/2023 08:14:27 -------------------------------------------------------------------------------- Nutrition Risk Screening Details Patient Name: Date of Service: [name] 04/04/2023 8:00 A M Medical Record Number: [id number] Patient Account Number: [id number] Date of Birth/Sex: Treating RN: 1981-05-08 (42 y.o. Gevena Mart Primary Care Kawena Lyday: Clovia Cuff D Other Clinician: Referring Tionna Gigante: Treating Sherilyn Windhorst/Extender: Duanne Guess BRO WN, A MBER Weeks in Treatment: 0 Height (in): 61 Weight (lbs): Body Mass Index (BMI): Nutrition Risk Screening Items Score Screening NUTRITION RISK SCREEN: I have an illness or condition that made me change the kind and/or amount of food I eat 0 No I eat fewer than two meals per day 0 No I eat few fruits and vegetables, or milk products 0 No I have three or more drinks of beer, liquor or wine almost every day 0 No I have tooth or mouth problems that make it hard for me to eat 0 No I don't always have enough money to buy the food I need 0 No I eat alone most of the time 0 No I take three or more different prescribed or over-the-counter drugs a day 0 No Without wanting to, I have lost or gained 10 pounds in the last six months 0 No I am not always physically able to shop, cook and/or feed myself 0 No Nutrition Protocols Good Risk Protocol Moderate Risk Protocol High Risk Proctocol Risk Level: Good Risk Score: 0 Electronic Signature(s) Signed: 04/09/2023 2:02:59 PM By: Brenton Grills Entered By: Brenton Grills on 04/04/2023 08:14:12

## 2023-04-11 ENCOUNTER — Encounter (HOSPITAL_BASED_OUTPATIENT_CLINIC_OR_DEPARTMENT_OTHER): Payer: Medicare Other | Admitting: General Surgery

## 2023-04-11 DIAGNOSIS — E11622 Type 2 diabetes mellitus with other skin ulcer: Secondary | ICD-10-CM | POA: Diagnosis not present

## 2023-04-23 ENCOUNTER — Encounter (HOSPITAL_BASED_OUTPATIENT_CLINIC_OR_DEPARTMENT_OTHER): Payer: Medicare Other | Attending: General Surgery | Admitting: General Surgery

## 2023-04-23 DIAGNOSIS — E11622 Type 2 diabetes mellitus with other skin ulcer: Secondary | ICD-10-CM | POA: Diagnosis not present

## 2023-04-23 DIAGNOSIS — Z89612 Acquired absence of left leg above knee: Secondary | ICD-10-CM | POA: Insufficient documentation

## 2023-04-23 DIAGNOSIS — N186 End stage renal disease: Secondary | ICD-10-CM | POA: Diagnosis not present

## 2023-04-23 DIAGNOSIS — E1151 Type 2 diabetes mellitus with diabetic peripheral angiopathy without gangrene: Secondary | ICD-10-CM | POA: Insufficient documentation

## 2023-04-23 DIAGNOSIS — L02413 Cutaneous abscess of right upper limb: Secondary | ICD-10-CM | POA: Insufficient documentation

## 2023-04-23 DIAGNOSIS — H5461 Unqualified visual loss, right eye, normal vision left eye: Secondary | ICD-10-CM | POA: Diagnosis not present

## 2023-04-23 DIAGNOSIS — E46 Unspecified protein-calorie malnutrition: Secondary | ICD-10-CM | POA: Diagnosis not present

## 2023-04-23 DIAGNOSIS — Z89611 Acquired absence of right leg above knee: Secondary | ICD-10-CM | POA: Insufficient documentation

## 2023-04-23 DIAGNOSIS — E1122 Type 2 diabetes mellitus with diabetic chronic kidney disease: Secondary | ICD-10-CM | POA: Insufficient documentation

## 2023-04-23 DIAGNOSIS — I120 Hypertensive chronic kidney disease with stage 5 chronic kidney disease or end stage renal disease: Secondary | ICD-10-CM | POA: Insufficient documentation

## 2023-04-23 DIAGNOSIS — Z992 Dependence on renal dialysis: Secondary | ICD-10-CM | POA: Insufficient documentation

## 2023-04-23 DIAGNOSIS — L98492 Non-pressure chronic ulcer of skin of other sites with fat layer exposed: Secondary | ICD-10-CM | POA: Diagnosis present

## 2023-04-23 NOTE — Progress Notes (Signed)
SHAILYNN, BERNINGER ([id number]) [id number] Page 1 of 6 Visit Report for 04/23/2023 Chief Complaint Document Details Patient Name: Date of Service: [name] 04/23/2023 10:00 A M Medical Record Number: [id number] Patient Account Number: [id number] Date of Birth/Sex: Treating RN: 1980/09/05 (42 y.o. F) Primary Care Provider: Gailen Shelter Other Clinician: Referring Provider: Treating Provider/Extender: Darnelle Spangle, TRIA D Weeks in Treatment: 2 Information Obtained from: Patient Chief Complaint Patient seen for complaints of Non-Healing Wound. Electronic Signature(s) Signed: 04/23/2023 10:21:03 AM By: Duanne Guess MD FACS Entered By: Duanne Guess on 04/23/2023 07:21:03 -------------------------------------------------------------------------------- HPI Details Patient Name: Date of Service: [name], A LEXIS 04/23/2023 10:00 A M Medical Record Number: [id number] Patient Account Number: [id number] Date of Birth/Sex: Treating RN: [birth year] (42 y.o. F) Primary Care Provider: Gailen Shelter Other Clinician: Referring Provider: Treating Provider/Extender: Darnelle Spangle, TRIA D Weeks in Treatment: 2 History of Present Illness HPI Description: ADMISSION 04/04/2023 This is a 42 year old poorly controlled type II diabetic (last hemoglobin A1c 8.2%) who is a bilateral above-knee amputee, blind in her right eye, with end-stage renal disease on hemodialysis. She presented to the emergency department on August 4 complaining of pain and swelling in her right arm. She was noted to have an abscess on her right posterior shoulder. An attempt was made at incision and drainage, but the site could not be completely evacuated secondary to patient discomfort. She was given a course of clindamycin. I do not see that any culture was obtained at that time. She was referred to the wound care center for further evaluation and management of the site. 04/11/2023: The  wound is smaller today. Better granulation tissue is starting to emerge, but there is still some fibrotic slough on the surface. 04/23/2023: Her wound is healed. Electronic Signature(s) Signed: 04/23/2023 10:21:21 AM By: Duanne Guess MD FACS Entered By: Duanne Guess on 04/23/2023 07:21:20 Physical Exam Details -------------------------------------------------------------------------------- Rene Paci ([id number]) [id number] Page 2 of 6 Patient Name: Date of Service: [name] 04/23/2023 10:00 A M Medical Record Number: [id number] Patient Account Number: [id number] Date of Birth/Sex: Treating RN: [birth year] (42 y.o. F) Primary Care Provider: Gailen Shelter Other Clinician: Referring Provider: Treating Provider/Extender: [employer], TRIA D Weeks in Treatment: 2 Constitutional Hypertensive, asymptomatic. . . . no acute distress. Respiratory Normal work of breathing on room air. Notes 04/23/2023: Her wound is healed. Electronic Signature(s) Signed: 04/23/2023 10:21:56 AM By: Duanne Guess MD FACS Entered By: Duanne Guess on 04/23/2023 07:21:55 -------------------------------------------------------------------------------- Physician Orders Details Patient Name: Date of Service: [name], A LEXIS 04/23/2023 10:00 A M Medical Record Number: [id number] Patient Account Number: [id number] Date of Birth/Sex: Treating RN: 24-Jan-1981 (42 y.o. Fredderick Phenix Primary Care Provider: Clovia Cuff D Other Clinician: Referring Provider: Treating Provider/Extender: Darnelle Spangle, TRIA D Weeks in Treatment: 2 The following information was scribed by: Samuella Bruin The information was scribed for: Duanne Guess Verbal / Phone Orders: No Diagnosis Coding Discharge From [facility] Services Discharge from Wound Care Center - Congratulations!!!!! Electronic Signature(s) Signed: 04/23/2023 10:22:02 AM By: Duanne Guess MD FACS Entered  By: Duanne Guess on 04/23/2023 07:22:02 -------------------------------------------------------------------------------- Problem List Details Patient Name: Date of Service: [name], A LEXIS 04/23/2023 10:00 A M Medical Record Number: [id number] Patient Account Number: [id number] Date of Birth/Sex: Treating RN: 1981-02-27 (42 y.o. F) Primary Care Provider: Gailen Shelter Other Clinician: Referring Provider: Treating Provider/Extender: Duanne [employer], TRIA D Weeks in Treatment: 2 Active Problems ICD-10 Encounter Code Description Active Date MDM Diagnosis  R60.454 Non-pressure chronic ulcer of skin of other sites with fat layer exposed 04/04/2023 No Yes KIMILA, SYNNOTT ([id number]) [id number] Page 3 of 6 L02.413 Cutaneous abscess of right upper limb 04/04/2023 No Yes E11.622 Type 2 diabetes mellitus with other skin ulcer 04/04/2023 No Yes I73.9 Peripheral vascular disease, unspecified 04/04/2023 No Yes N18.6 End stage renal disease 04/04/2023 No Yes E46 Unspecified protein-calorie malnutrition 04/04/2023 No Yes Z89.611 Acquired absence of right leg above knee 04/04/2023 No Yes Z89.612 Acquired absence of left leg above knee 04/04/2023 No Yes Inactive Problems Resolved Problems Electronic Signature(s) Signed: 04/23/2023 10:19:16 AM By: Duanne Guess MD FACS Entered By: Duanne Guess on 04/23/2023 07:19:16 -------------------------------------------------------------------------------- Progress Note Details Patient Name: Date of Service: [name], A LEXIS 04/23/2023 10:00 A M Medical Record Number: [id number] Patient Account Number: [id number] Date of Birth/Sex: Treating RN: [birth year] (42 y.o. F) Primary Care Provider: Gailen Shelter Other Clinician: Referring Provider: Treating Provider/Extender: Darnelle Spangle, TRIA D Weeks in Treatment: 2 Subjective Chief Complaint Information obtained from Patient Patient seen for complaints of Non-Healing  Wound. History of Present Illness (HPI) ADMISSION 04/04/2023 This is a 42 year old poorly controlled type II diabetic (last hemoglobin A1c 8.2%) who is a bilateral above-knee amputee, blind in her right eye, with end-stage renal disease on hemodialysis. She presented to the emergency department on August 4 complaining of pain and swelling in her right arm. She was noted to have an abscess on her right posterior shoulder. An attempt was made at incision and drainage, but the site could not be completely evacuated secondary to patient discomfort. She was given a course of clindamycin. I do not see that any culture was obtained at that time. She was referred to the wound care center for further evaluation and management of the site. 04/11/2023: The wound is smaller today. Better granulation tissue is starting to emerge, but there is still some fibrotic slough on the surface. 04/23/2023: Her wound is healed. Patient History SHELBIA, WAHLERT ([id number]) [id number] Page 4 of 6 Medical History Endocrine Patient has history of Type II Diabetes Genitourinary Patient has history of End Stage Renal Disease Medical A Surgical History Notes nd Eyes Blind in right eye Musculoskeletal Guillain Barre Neurologic CVA Psychiatric Anxiety Objective Constitutional Hypertensive, asymptomatic. no acute distress. Vitals Time Taken: 10:05 AM, Height: 61 in, Temperature: 98.6 F, Pulse: 92 bpm, Respiratory Rate: 18 breaths/min, Blood Pressure: 173/110 mmHg. Respiratory Normal work of breathing on room air. General Notes: 04/23/2023: Her wound is healed. Integumentary (Hair, Skin) Wound #1 status is Healed - Epithelialized. Original cause of wound was Gradually Appeared. The date acquired was: 03/28/2023. The wound has been in treatment 2 weeks. The wound is located on the Right,Anterior Shoulder. The wound measures 0cm length x 0cm width x 0cm depth; 0cm^2 area and 0cm^3 volume.  There is no tunneling or undermining noted. There is a none present amount of drainage noted. The wound margin is distinct with the outline attached to the wound base. There is no granulation within the wound bed. There is no necrotic tissue within the wound bed. The periwound skin appearance had no abnormalities noted for texture. The periwound skin appearance had no abnormalities noted for moisture. The periwound skin appearance had no abnormalities noted for color. Periwound temperature was noted as No Abnormality. Assessment Active Problems ICD-10 Non-pressure chronic ulcer of skin of other sites with fat layer exposed Cutaneous abscess of right upper limb Type 2 diabetes mellitus with other skin ulcer Peripheral vascular disease, unspecified End stage renal disease  Unspecified protein-calorie malnutrition Acquired absence of right leg above knee Acquired absence of left leg above knee Plan Discharge From [facility] Services: Discharge from Wound Care Center - Congratulations!!!!! 04/23/2023: Her wound is healed. She has no ongoing needs from the wound care center so we will discharge her. She may return in the future if it is ever needed. Electronic Signature(s) Signed: 04/23/2023 10:22:28 AM By: Duanne Guess MD FACS Entered By: Duanne Guess on 04/23/2023 40:10:27 Rene Paci ([id number]) [id number] Page 5 of 6 -------------------------------------------------------------------------------- HxROS Details Patient Name: Date of Service: [name] 04/23/2023 10:00 A M Medical Record Number: [id number] Patient Account Number: [id number] Date of Birth/Sex: Treating RN: [birth year] (42 y.o. F) Primary Care Provider: Gailen Shelter Other Clinician: Referring Provider: Treating Provider/Extender: [employer], TRIA D Weeks in Treatment: 2 Eyes Medical History: Past Medical History Notes: Blind in right eye Endocrine Medical History: Positive  for: Type II Diabetes Genitourinary Medical History: Positive for: End Stage Renal Disease Musculoskeletal Medical History: Past Medical History Notes: Guillain Barre Neurologic Medical History: Past Medical History Notes: CVA Psychiatric Medical History: Past Medical History Notes: Anxiety Immunizations Implantable Devices No devices added Electronic Signature(s) Signed: 04/23/2023 12:01:13 PM By: Duanne Guess MD FACS Entered By: Duanne Guess on 04/23/2023 07:21:27 -------------------------------------------------------------------------------- SuperBill Details Patient Name: Date of Service: [name], A LEXIS 04/23/2023 Medical Record Number: [id number] Patient Account Number: [id number] Date of Birth/Sex: Treating RN: 1981-04-04 (42 y.o. Fredderick Phenix Primary Care Provider: Clovia Cuff D Other Clinician: Referring Provider: Treating Provider/Extender: Ulyses Amor Berry, Jon Gills ([id number]) [id number] Page 6 of 6 Weeks in Treatment: 2 Diagnosis Coding ICD-10 Codes Code Description [phone] Non-pressure chronic ulcer of skin of other sites with fat layer exposed L02.413 Cutaneous abscess of right upper limb E11.622 Type 2 diabetes mellitus with other skin ulcer I73.9 Peripheral vascular disease, unspecified N18.6 End stage renal disease E46 Unspecified protein-calorie malnutrition Z89.611 Acquired absence of right leg above knee Z89.612 Acquired absence of left leg above knee Facility Procedures : CPT4 Code: [id number] Description: 62694 - WOUND CARE VISIT-LEV 2 EST PT Modifier: Quantity: 1 Physician Procedures : CPT4 Code Description Modifier [id number] - WC PHYS LEVEL 2 - EST PT ICD-10 Diagnosis Description L98.492 Non-pressure chronic ulcer of skin of other sites with fat layer exposed L02.413 Cutaneous abscess of right upper limb E11.622 Type 2 diabetes  mellitus with other skin ulcer I73.9 Peripheral  vascular disease, unspecified Quantity: 1 Electronic Signature(s) Signed: 04/23/2023 10:23:26 AM By: Duanne Guess MD FACS Entered By: Duanne Guess on 04/23/2023 38:18:29

## 2023-04-23 NOTE — Progress Notes (Signed)
AHMIYAH, KOSKELA ([id number]) [id number] Page 1 of 8 Visit Report for 04/23/2023 Arrival Information Details Patient Name: Date of Service: [name] 04/23/2023 10:00 A M Medical Record Number: [id number] Patient Account Number: [id number] Date of Birth/Sex: Treating RN: [birth year] (42 y.o. F) Primary Care Arlene Brickel: Clovia Cuff D Other Clinician: Referring Shailen Thielen: Treating Katalia Choma/Extender: [employer], TRIA D Weeks in Treatment: 2 Visit Information History Since Last Visit All ordered tests and consults were completed: No Patient Arrived: Wheel Chair Added or deleted any medications: No Arrival Time: 10:05 Any new allergies or adverse reactions: No Accompanied By: self Had a fall or experienced change in No Transfer Assistance: None activities of daily living that may affect Patient Identification Verified: Yes risk of falls: Secondary Verification Process Completed: Yes Signs or symptoms of abuse/neglect since last visito No Patient Requires Transmission-Based Precautions: No Hospitalized since last visit: No Patient Has Alerts: No Implantable device outside of the clinic excluding No cellular tissue based products placed in the center since last visit: Pain Present Now: No Electronic Signature(s) Signed: 04/23/2023 10:07:39 AM By: Dayton Scrape Entered By: Dayton Scrape on 04/23/2023 07:05:25 -------------------------------------------------------------------------------- Clinic Level of Care Assessment Details Patient Name: Date of Service: [name] 04/23/2023 10:00 A M Medical Record Number: [id number] Patient Account Number: [id number] Date of Birth/Sex: Treating RN: 1981/01/09 (42 y.o. Fredderick Phenix Primary Care Niraj Kudrna: Clovia Cuff D Other Clinician: Referring Koi Yarbro: Treating Kelsye Loomer/Extender: [employer], TRIA D Weeks in Treatment: 2 Clinic Level of Care Assessment Items TOOL 4 Quantity Score X- 1  0 Use when only an EandM is performed on FOLLOW-UP visit ASSESSMENTS - Nursing Assessment / Reassessment []  - 0 Reassessment of Co-morbidities (includes updates in patient status) X- 1 5 Reassessment of Adherence to Treatment Plan ASSESSMENTS - Wound and Skin A ssessment / Reassessment X - Simple Wound Assessment / Reassessment - one wound 1 5 []  - 0 Complex Wound Assessment / Reassessment - multiple wounds []  - 0 Dermatologic / Skin Assessment (not related to wound area) ASSESSMENTS - Focused Assessment []  - 0 Circumferential Edema Measurements - multi extremities []  - 0 Nutritional Assessment / Counseling / Intervention LAQUANNA, KASPRZYK ([id number]) [id number] Page 2 of 8 []  - 0 Lower Extremity Assessment (monofilament, tuning fork, pulses) []  - 0 Peripheral Arterial Disease Assessment (using hand held doppler) ASSESSMENTS - Ostomy and/or Continence Assessment and Care []  - 0 Incontinence Assessment and Management []  - 0 Ostomy Care Assessment and Management (repouching, etc.) PROCESS - Coordination of Care X - Simple Patient / Family Education for ongoing care 1 15 []  - 0 Complex (extensive) Patient / Family Education for ongoing care X- 1 10 Staff obtains Chiropractor, Records, T Results / Process Orders est []  - 0 Staff telephones HHA, Nursing Homes / Clarify orders / etc []  - 0 Routine Transfer to another Facility (non-emergent condition) []  - 0 Routine Hospital Admission (non-emergent condition) []  - 0 New Admissions / Manufacturing engineer / Ordering NPWT Apligraf, etc. , []  - 0 Emergency Hospital Admission (emergent condition) X- 1 10 Simple Discharge Coordination []  - 0 Complex (extensive) Discharge Coordination PROCESS - Special Needs []  - 0 Pediatric / Minor Patient Management []  - 0 Isolation Patient Management []  - 0 Hearing / Language / Visual special needs []  - 0 Assessment of Community assistance (transportation, D/C  planning, etc.) []  - 0 Additional assistance / Altered mentation []  - 0 Support Surface(s) Assessment (bed, cushion, seat, etc.) INTERVENTIONS - Wound Cleansing / Measurement []  - 0  Simple Wound Cleansing - one wound []  - 0 Complex Wound Cleansing - multiple wounds []  - 0 Wound Imaging (photographs - any number of wounds) []  - 0 Wound Tracing (instead of photographs) []  - 0 Simple Wound Measurement - one wound []  - 0 Complex Wound Measurement - multiple wounds INTERVENTIONS - Wound Dressings []  - 0 Small Wound Dressing one or multiple wounds []  - 0 Medium Wound Dressing one or multiple wounds []  - 0 Large Wound Dressing one or multiple wounds []  - 0 Application of Medications - topical []  - 0 Application of Medications - injection INTERVENTIONS - Miscellaneous []  - 0 External ear exam []  - 0 Specimen Collection (cultures, biopsies, blood, body fluids, etc.) []  - 0 Specimen(s) / Culture(s) sent or taken to Lab for analysis []  - 0 Patient Transfer (multiple staff / Nurse, adult / Similar devices) []  - 0 Simple Staple / Suture removal (25 or less) []  - 0 Complex Staple / Suture removal (26 or more) []  - 0 Hypo / Hyperglycemic Management (close monitor of Blood Glucose) ZENIYAH, ZORA ([id number]) [id number] Page 3 of 8 []  - 0 Ankle / Brachial Index (ABI) - do not check if billed separately X- 1 5 Vital Signs Has the patient been seen at the hospital within the last three years: Yes Total Score: 50 Level Of Care: New/Established - Level 2 Electronic Signature(s) Signed: 04/23/2023 3:31:30 PM By: Samuella Bruin Entered By: Samuella Bruin on 04/23/2023 07:16:03 -------------------------------------------------------------------------------- Encounter Discharge Information Details Patient Name: Date of Service: Berneice Gandy, A LEXIS 04/23/2023 10:00 A M Medical Record Number: [id number] Patient Account Number: [id number] Date of Birth/Sex:  Treating RN: June 27, 1981 (42 y.o. Fredderick Phenix Primary Care Tenisha Fleece: Clovia Cuff D Other Clinician: Referring Jolayne Branson: Treating Samadhi Mahurin/Extender: [employer], TRIA D Weeks in Treatment: 2 Encounter Discharge Information Items Discharge Condition: Stable Ambulatory Status: Wheelchair Discharge Destination: Skilled Nursing Facility Telephoned: No Orders Sent: Yes Transportation: Private Auto Accompanied By: self Schedule Follow-up Appointment: No Clinical Summary of Care: Patient Declined Electronic Signature(s) Signed: 04/23/2023 3:31:30 PM By: Gelene Mink By: Samuella Bruin on 04/23/2023 07:16:30 -------------------------------------------------------------------------------- Lower Extremity Assessment Details Patient Name: Date of Service: [name] 04/23/2023 10:00 A M Medical Record Number: [id number] Patient Account Number: [id number] Date of Birth/Sex: Treating RN: 04/14/1981 (42 y.o. Fredderick Phenix Primary Care Laterra Lubinski: Clovia Cuff D Other Clinician: Referring Antoin Dargis: Treating Berry Godsey/Extender: Darnelle Spangle, TRIA D Weeks in Treatment: 2 Electronic Signature(s) Signed: 04/23/2023 3:31:30 PM By: Gelene Mink By: Samuella Bruin on 04/23/2023 07:06:44 Multi Wound Chart Details -------------------------------------------------------------------------------- Rene Paci ([id number]) [id number] Page 4 of 8 Patient Name: Date of Service: [name] 04/23/2023 10:00 A M Medical Record Number: [id number] Patient Account Number: [id number] Date of Birth/Sex: Treating RN: [birth year] (42 y.o. F) Primary Care Jarin Cornfield: Gailen Shelter Other Clinician: Referring Zak Gondek: Treating Paisli Silfies/Extender: [employer], TRIA D Weeks in Treatment: 2 Vital Signs Height(in): 61 Pulse(bpm): 92 Weight(lbs): Blood Pressure(mmHg): 173/110 Body Mass Index(BMI): Temperature(F):  98.6 Respiratory Rate(breaths/min): 18 Wound Assessments Wound Number: 1 N/A N/A Photos: N/A N/A Right, Anterior Shoulder N/A N/A Wound Location: Gradually Appeared N/A N/A Wounding Event: Cyst N/A N/A Primary Etiology: Type II Diabetes, End Stage Renal N/A N/A Comorbid History: Disease 03/28/2023 N/A N/A Date Acquired: 2 N/A N/A Weeks of Treatment: Healed - Epithelialized N/A N/A Wound Status: No N/A N/A Wound Recurrence: 0x0x0 N/A N/A Measurements L x W x D (cm) 0 N/A N/A A (cm) : rea 0 N/A N/A  Volume (cm) : 100.00% N/A N/A % Reduction in Area: 100.00% N/A N/A % Reduction in Volume: Full Thickness Without Exposed N/A N/A Classification: Support Structures None Present N/A N/A Exudate Amount: Distinct, outline attached N/A N/A Wound Margin: None Present (0%) N/A N/A Granulation Amount: None Present (0%) N/A N/A Necrotic Amount: Fascia: No N/A N/A Exposed Structures: Fat Layer (Subcutaneous Tissue): No Tendon: No Muscle: No Joint: No Bone: No Large (67-100%) N/A N/A Epithelialization: No Abnormalities Noted N/A N/A Periwound Skin Texture: Maceration: No N/A N/A Periwound Skin Moisture: Dry/Scaly: No No Abnormalities Noted N/A N/A Periwound Skin Color: No Abnormality N/A N/A Temperature: Treatment Notes Wound #1 (Shoulder) Wound Laterality: Right, Anterior Cleanser Peri-Wound Care Topical Primary Dressing Secondary Dressing Secured With Compression Wrap Compression Stockings Add-Ons LINDE, LEISING ([id number]) [id number] Page 5 of 8 Electronic Signature(s) Signed: 04/23/2023 10:20:57 AM By: Duanne Guess MD FACS Entered By: Duanne Guess on 04/23/2023 07:20:57 -------------------------------------------------------------------------------- Multi-Disciplinary Care Plan Details Patient Name: Date of Service: Berneice Gandy, A LEXIS 04/23/2023 10:00 A M Medical Record Number: [id number] Patient Account Number:  [id number] Date of Birth/Sex: Treating RN: Mar 21, 1981 (42 y.o. Fredderick Phenix Primary Care Javyn Havlin: Clovia Cuff D Other Clinician: Referring Ruby Dilone: Treating Lior Cartelli/Extender: Darnelle Spangle, TRIA D Weeks in Treatment: 2 Active Inactive Electronic Signature(s) Signed: 04/23/2023 3:31:30 PM By: Gelene Mink By: Samuella Bruin on 04/23/2023 07:15:34 -------------------------------------------------------------------------------- Pain Assessment Details Patient Name: Date of Service: [name] 04/23/2023 10:00 A M Medical Record Number: [id number] Patient Account Number: [id number] Date of Birth/Sex: Treating RN: 10-09-1980 (42 y.o. F) Primary Care Makael Stein: Gailen Shelter Other Clinician: Referring Dravon Nott: Treating Kohl Polinsky/Extender: Darnelle Spangle, TRIA D Weeks in Treatment: 2 Active Problems Location of Pain Severity and Description of Pain Patient Has Paino No Site Locations Pain Management and Medication Current Pain Management: DORATHY, BODENHAMER ([id number]) [id number] Page 6 of 8 Electronic Signature(s) Signed: 04/23/2023 10:07:39 AM By: Dayton Scrape Entered By: Dayton Scrape on 04/23/2023 07:05:58 -------------------------------------------------------------------------------- Patient/Caregiver Education Details Patient Name: Date of Service: Berneice Gandy, A LEXIS 9/10/2024andnbsp10:00 A M Medical Record Number: [id number] Patient Account Number: [id number] Date of Birth/Gender: Treating RN: 31-Dec-1980 (42 y.o. Fredderick Phenix Primary Care Physician: Clovia Cuff D Other Clinician: Referring Physician: Treating Physician/Extender: Ulyses Amor Weeks in Treatment: 2 Education Assessment Education Provided To: Patient Education Topics Provided Safety: Methods: Explain/Verbal Responses: Reinforcements needed, State content correctly Electronic Signature(s) Signed: 04/23/2023 3:31:30 PM By:  Samuella Bruin Entered By: Samuella Bruin on 04/23/2023 07:15:47 -------------------------------------------------------------------------------- Wound Assessment Details Patient Name: Date of Service: Berneice Gandy, A LEXIS 04/23/2023 10:00 A M Medical Record Number: [id number] Patient Account Number: [id number] Date of Birth/Sex: Treating RN: 1980/09/11 (42 y.o. Fredderick Phenix Primary Care Aleyah Balik: Clovia Cuff D Other Clinician: Referring Marlyce Mcdougald: Treating Donaciano Range/Extender: Duanne [employer], TRIA D Weeks in Treatment: 2 Wound Status Wound Number: 1 Primary Etiology: Cyst Wound Location: Right, Anterior Shoulder Wound Status: Healed - Epithelialized Wounding Event: Gradually Appeared Comorbid History: Type II Diabetes, End Stage Renal Disease Date Acquired: 03/28/2023 Weeks Of Treatment: 2 Clustered Wound: No Photos TONE, MILLAGE ([id number]) [id number] Page 7 of 8 Wound Measurements Length: (cm) Width: (cm) Depth: (cm) Area: (cm) Volume: (cm) 0 % Reduction in Area: 100% 0 % Reduction in Volume: 100% 0 Epithelialization: Large (67-100%) 0 Tunneling: No 0 Undermining: No Wound Description Classification: Full Thickness Without Exposed Suppor Wound Margin: Distinct, outline attached Exudate Amount: None Present t Structures Foul Odor After Cleansing: No Slough/Fibrino No Wound Bed Granulation Amount: None Present (  0%) Exposed Structure Necrotic Amount: None Present (0%) Fascia Exposed: No Fat Layer (Subcutaneous Tissue) Exposed: No Tendon Exposed: No Muscle Exposed: No Joint Exposed: No Bone Exposed: No Periwound Skin Texture Texture Color No Abnormalities Noted: Yes No Abnormalities Noted: Yes Moisture Temperature / Pain No Abnormalities Noted: Yes Temperature: No Abnormality Treatment Notes Wound #1 (Shoulder) Wound Laterality: Right, Anterior Cleanser Peri-Wound Care Topical Primary Dressing Secondary  Dressing Secured With Compression Wrap Compression Stockings Add-Ons Electronic Signature(s) Signed: 04/23/2023 3:31:30 PM By: Samuella Bruin Entered By: Samuella Bruin on 04/23/2023 07:09:30 Vitals Details -------------------------------------------------------------------------------- Rene Paci ([id number]) [id number] Page 8 of 8 Patient Name: Date of Service: [name] 04/23/2023 10:00 A M Medical Record Number: [id number] Patient Account Number: [id number] Date of Birth/Sex: Treating RN: [birth year] (42 y.o. F) Primary Care Mishawn Didion: Clovia Cuff D Other Clinician: Referring Rachana Malesky: Treating Ambermarie Honeyman/Extender: [employer], TRIA D Weeks in Treatment: 2 Vital Signs Time Taken: 10:05 Temperature (F): 98.6 Height (in): 61 Pulse (bpm): 92 Respiratory Rate (breaths/min): 18 Blood Pressure (mmHg): 173/110 Reference Range: 80 - 120 mg / dl Electronic Signature(s) Signed: 04/23/2023 10:07:39 AM By: Dayton Scrape Entered By: Dayton Scrape on 04/23/2023 07:05:52

## 2023-04-25 NOTE — Progress Notes (Signed)
EERA, BUTTER ([id number]) [id number] Page 1 of 6 Visit Report for 04/11/2023 Arrival Information Details Patient Name: Date of Service: [name] 04/11/2023 10:15 Tracey M Medical Record Number: [id number] Patient Account Number: [id number] Date of Birth/Sex: Treating RN: [birth year] (42 y.o. F) Zochol, Jamie Primary Care Krystl Wickware: Clovia Cuff D Other Clinician: Referring Kiev Labrosse: Treating Riku Buttery/Extender: Darnelle Spangle, TRIA D Weeks in Treatment: 1 Visit Information History Since Last Visit Added or deleted any medications: No Patient Arrived: Wheel Chair Any new allergies or adverse reactions: No Arrival Time: 09:49 Had Tracey fall or experienced change in No Accompanied By: self activities of daily living that may affect Transfer Assistance: None risk of falls: Patient Identification Verified: Yes Signs or symptoms of abuse/neglect since last visito No Secondary Verification Process Completed: Yes Hospitalized since last visit: No Patient Requires Transmission-Based Precautions: No Implantable device outside of the clinic excluding No Patient Has Alerts: No cellular tissue based products placed in the center since last visit: Has Dressing in Place as Prescribed: Yes Pain Present Now: No Electronic Signature(s) Signed: 04/25/2023 2:13:46 PM By: Tommie Ard RN Entered By: Tommie Ard on 04/11/2023 09:50:12 -------------------------------------------------------------------------------- Encounter Discharge Information Details Patient Name: Date of Service: [name], [name] 04/11/2023 10:15 Tracey M Medical Record Number: [id number] Patient Account Number: [id number] Date of Birth/Sex: Treating RN: 28-Dec-1980 (42 y.o. F) Zochol, Jamie Primary Care Ambika Zettlemoyer: Gailen Shelter Other Clinician: Referring Miliana Gangwer: Treating Passion Lavin/Extender: Duanne [employer], TRIA D Weeks in Treatment: 1 Encounter Discharge Information Items Post Procedure  Vitals Discharge Condition: Stable Temperature (F): 98.9 Ambulatory Status: Wheelchair Pulse (bpm): 93 Discharge Destination: Skilled Nursing Facility Respiratory Rate (breaths/min): 18 Telephoned: No Blood Pressure (mmHg): 130/45 Orders Sent: Yes Transportation: Private Auto Accompanied By: caregiver Schedule Follow-up Appointment: Yes Clinical Summary of Care: Electronic Signature(s) Signed: 04/11/2023 10:20:01 AM By: Tommie Ard RN Entered By: Tommie Ard on 04/11/2023 10:20:01 Rene Paci ([id number]) [id number] Page 2 of 6 -------------------------------------------------------------------------------- Lower Extremity Assessment Details Patient Name: Date of Service: [name] 04/11/2023 10:15 Tracey M Medical Record Number: [id number] Patient Account Number: [id number] Date of Birth/Sex: Treating RN: 1980/12/23 (42 y.o. Kateri Mc Primary Care Marrie Chandra: Gailen Shelter Other Clinician: Referring Oaklen Thiam: Treating Kailea Dannemiller/Extender: Darnelle Spangle, TRIA D Weeks in Treatment: 1 Electronic Signature(s) Signed: 04/25/2023 2:13:46 PM By: Tommie Ard RN Entered By: Tommie Ard on 04/11/2023 09:52:50 -------------------------------------------------------------------------------- Multi Wound Chart Details Patient Name: Date of Service: [name], [name] 04/11/2023 10:15 Tracey M Medical Record Number: [id number] Patient Account Number: [id number] Date of Birth/Sex: Treating RN: 10-05-1980 (42 y.o. F) Primary Care Rayven Rettig: Gailen Shelter Other Clinician: Referring Danity Schmelzer: Treating Mackenzie Lia/Extender: Duanne [employer], TRIA D Weeks in Treatment: 1 Vital Signs Height(in): 61 Pulse(bpm): 93 Weight(lbs): Blood Pressure(mmHg): 130/45 Body Mass Index(BMI): Temperature(F): 98.9 Respiratory Rate(breaths/min): 18 [1:Photos:] [N/Tracey:N/Tracey] Right, Anterior Shoulder N/Tracey N/Tracey Wound Location: Gradually Appeared N/Tracey N/Tracey Wounding Event: Cyst N/Tracey  N/Tracey Primary Etiology: Type II Diabetes, End Stage Renal N/Tracey N/Tracey Comorbid History: Disease 03/28/2023 N/Tracey N/Tracey Date Acquired: 1 N/Tracey N/Tracey Weeks of Treatment: Open N/Tracey N/Tracey Wound Status: No N/Tracey N/Tracey Wound Recurrence: 1.3x0.3x0.1 N/Tracey N/Tracey Measurements L x W x D (cm) 0.306 N/Tracey N/Tracey Tracey (cm) : rea 0.031 N/Tracey N/Tracey Volume (cm) : 55.70% N/Tracey N/Tracey % Reduction in Area: 55.10% N/Tracey N/Tracey % Reduction in Volume: Full Thickness Without Exposed N/Tracey N/Tracey Classification: Support Structures Medium N/Tracey N/Tracey Exudate Tracey mount: Serosanguineous N/Tracey N/Tracey Exudate Type: red, brown N/Tracey N/Tracey Exudate Color: Distinct, outline attached  N/Tracey N/Tracey Wound Margin: Medium (34-66%) N/Tracey N/Tracey Granulation Tracey mount: Red, Pink N/Tracey N/Tracey Granulation Quality: Medium (34-66%) N/Tracey N/Tracey Necrotic Tracey mount: Fat Layer (Subcutaneous Tissue): Yes N/Tracey N/Tracey Exposed Structures: Small (1-33%) N/Tracey N/Tracey EpithelializationLOUVENA, [name] ([id number]) [id number] Page 3 of 6 Debridement - Selective/Open Wound N/Tracey N/Tracey Debridement: 10:04 N/Tracey N/Tracey Pre-procedure Verification/Time Out Taken: Lidocaine 5% topical ointment N/Tracey N/Tracey Pain Control: Slough N/Tracey N/Tracey Tissue Debrided: Non-Viable Tissue N/Tracey N/Tracey Level: 0.31 N/Tracey N/Tracey Debridement Tracey (sq cm): rea Curette N/Tracey N/Tracey Instrument: Minimum N/Tracey N/Tracey Bleeding: Pressure N/Tracey N/Tracey Hemostasis Tracey chieved: Procedure was tolerated well N/Tracey N/Tracey Debridement Treatment Response: 1.3x0.3x0.1 N/Tracey N/Tracey Post Debridement Measurements L x W x D (cm) 0.031 N/Tracey N/Tracey Post Debridement Volume: (cm) No Abnormalities Noted N/Tracey N/Tracey Periwound Skin Texture: Maceration: No N/Tracey N/Tracey Periwound Skin Moisture: Dry/Scaly: No No Abnormalities Noted N/Tracey N/Tracey Periwound Skin Color: Debridement N/Tracey N/Tracey Procedures Performed: Treatment Notes Electronic Signature(s) Signed: 04/11/2023 10:08:34 AM By: Duanne Guess MD FACS Entered By: Duanne Guess on 04/11/2023  10:08:34 -------------------------------------------------------------------------------- Multi-Disciplinary Care Plan Details Patient Name: Date of Service: [name], [name] 04/11/2023 10:15 Tracey M Medical Record Number: [id number] Patient Account Number: [id number] Date of Birth/Sex: Treating RN: 1980-08-26 (42 y.o. F) Zochol, Jamie Primary Care Alfonzo Arca: Gailen Shelter Other Clinician: Referring Aurelia Gras: Treating Erhardt Dada/Extender: [employer], TRIA D Weeks in Treatment: 1 Active Inactive Orientation to the Wound Care Program Nursing Diagnoses: Knowledge deficit related to the wound healing center program Goals: Patient/caregiver will verbalize understanding of the Wound Healing Center Program Date Initiated: 04/04/2023 Target Resolution Date: 05/23/2023 Goal Status: Active Interventions: Provide education on orientation to the wound center Notes: Wound/Skin Impairment Nursing Diagnoses: Knowledge deficit related to ulceration/compromised skin integrity Goals: Ulcer/skin breakdown will have Tracey volume reduction of 30% by week 4 Date Initiated: 04/04/2023 Target Resolution Date: 05/05/2023 Goal Status: Active Interventions: Assess patient/caregiver ability to obtain necessary supplies Assess patient/caregiver ability to perform ulcer/skin care regimen upon admission and as needed Assess ulceration(s) every visit [name], [name] ([id number]) [id number] Page 4 of 6 Notes: Electronic Signature(s) Signed: 04/25/2023 2:13:46 PM By: Tommie Ard RN Entered By: Tommie Ard on 04/11/2023 10:06:42 -------------------------------------------------------------------------------- Pain Assessment Details Patient Name: Date of Service: [name] 04/11/2023 10:15 Tracey M Medical Record Number: [id number] Patient Account Number: [id number] Date of Birth/Sex: Treating RN: 1981/03/20 (42 y.o. F) Zochol, Jamie Primary Care Kenya Kook: Gailen Shelter Other  Clinician: Referring Kingsly Kloepfer: Treating Eila Runyan/Extender: Darnelle Spangle, TRIA D Weeks in Treatment: 1 Active Problems Location of Pain Severity and Description of Pain Patient Has Paino No Site Locations Pain Management and Medication Current Pain Management: Electronic Signature(s) Signed: 04/25/2023 2:13:46 PM By: Tommie Ard RN Entered By: Tommie Ard on 04/11/2023 09:52:44 -------------------------------------------------------------------------------- Patient/Caregiver Education Details Patient Name: Date of Service: [name], [name] 8/29/2024andnbsp10:15 Tracey M Medical Record Number: [id number] Patient Account Number: [id number] Date of Birth/Gender: Treating RN: 1980-10-03 (42 y.o. Kateri Mc Primary Care Physician: Clovia Cuff D Other Clinician: Referring Physician: Treating Physician/Extender: Ulyses Amor Weeks in Treatment: 1 Education Assessment Stover, Jon Gills ([id number]) [id number] Page 5 of 6 Education Provided To: Patient Education Topics Provided Wound Debridement: Methods: Explain/Verbal Responses: Reinforcements needed, State content correctly Wound/Skin Impairment: Methods: Explain/Verbal Responses: Reinforcements needed, State content correctly Electronic Signature(s) Signed: 04/25/2023 2:13:46 PM By: Tommie Ard RN Entered By: Tommie Ard on 04/11/2023 10:06:58 -------------------------------------------------------------------------------- Wound Assessment Details Patient Name: Date of Service: [name] 04/11/2023 10:15 Tracey M Medical Record Number:  [id number] Patient Account Number: [id number] Date of Birth/Sex: Treating RN: 07-03-1981 (42 y.o. F) Zochol, Jamie Primary Care Lashia Niese: Clovia Cuff D Other Clinician: Referring Jourdan Durbin: Treating Sahra Converse/Extender: Duanne [employer], TRIA D Weeks in Treatment: 1 Wound Status Wound Number: 1 Primary Etiology: Cyst Wound Location: Right,  Anterior Shoulder Wound Status: Open Wounding Event: Gradually Appeared Comorbid History: Type II Diabetes, End Stage Renal Disease Date Acquired: 03/28/2023 Weeks Of Treatment: 1 Clustered Wound: No Photos Wound Measurements Length: (cm) 1.3 Width: (cm) 0.3 Depth: (cm) 0.1 Area: (cm) 0.306 Volume: (cm) 0.031 % Reduction in Area: 55.7% % Reduction in Volume: 55.1% Epithelialization: Small (1-33%) Tunneling: No Undermining: No Wound Description Classification: Full Thickness Without Exposed Support Structures Wound Margin: Distinct, outline attached Exudate Amount: Medium Exudate Type: Serosanguineous Exudate Color: red, brown Foul Odor After Cleansing: No Slough/Fibrino Yes Wound Bed Granulation Amount: Medium (34-66%) Exposed [name], [name] ([id number]) [id number] Page 6 of 6 Granulation Quality: Red, Pink Fat Layer (Subcutaneous Tissue) Exposed: Yes Necrotic Amount: Medium (34-66%) Necrotic Quality: Adherent Slough Periwound Skin Texture Texture Color No Abnormalities Noted: Yes No Abnormalities Noted: Yes Moisture No Abnormalities Noted: No Dry / Scaly: No Maceration: No Electronic Signature(s) Signed: 04/25/2023 2:13:46 PM By: Tommie Ard RN Entered By: Tommie Ard on 04/11/2023 09:57:17 -------------------------------------------------------------------------------- Vitals Details Patient Name: Date of Service: [name], [name] 04/11/2023 10:15 Tracey M Medical Record Number: [id number] Patient Account Number: [id number] Date of Birth/Sex: Treating RN: 14-Jul-1981 (42 y.o. F) Zochol, Jamie Primary Care Manasa Spease: Clovia Cuff D Other Clinician: Referring Trentan Trippe: Treating Sheera Illingworth/Extender: [employer], TRIA D Weeks in Treatment: 1 Vital Signs Time Taken: 09:50 Temperature (F): 98.9 Height (in): 61 Pulse (bpm): 93 Respiratory Rate (breaths/min): 18 Blood Pressure (mmHg): 130/45 Reference Range: 80 - 120 mg /  dl Electronic Signature(s) Signed: 04/25/2023 2:13:46 PM By: Tommie Ard RN Entered By: Tommie Ard on 04/11/2023 09:52:37

## 2023-04-25 NOTE — Progress Notes (Signed)
MILAN, CARPINELLI ([id number]) [id number] Page 1 of 8 Visit Report for 04/11/2023 Chief Complaint Document Details Patient Name: Date of Service: [name] 04/11/2023 10:15 Tracey M Medical Record Number: [id number] Patient Account Number: [id number] Date of Birth/Sex: Treating RN: 07/21/1981 (42 y.o. F) Primary Care Provider: Gailen Shelter Other Clinician: Referring Provider: Treating Provider/Extender: Darnelle Spangle, TRIA D Weeks in Treatment: 1 Information Obtained from: Patient Chief Complaint Patient seen for complaints of Non-Healing Wound. Electronic Signature(s) Signed: 04/11/2023 10:08:48 AM By: Duanne Guess MD FACS Entered By: Duanne Guess on 04/11/2023 10:08:48 -------------------------------------------------------------------------------- Debridement Details Patient Name: Date of Service: [name], [name] 04/11/2023 10:15 Tracey M Medical Record Number: [id number] Patient Account Number: [id number] Date of Birth/Sex: Treating RN: Apr 14, 1981 (42 y.o. F) Zochol, Jamie Primary Care Provider: Clovia Cuff D Other Clinician: Referring Provider: Treating Provider/Extender: Darnelle Spangle, TRIA D Weeks in Treatment: 1 Debridement Performed for Assessment: Wound #1 Right,Anterior Shoulder Performed By: Physician Duanne Guess, MD Debridement Type: Debridement Level of Consciousness (Pre-procedure): Awake and Alert Pre-procedure Verification/Time Out Yes - 10:04 Taken: Start Time: 10:05 Pain Control: Lidocaine 5% topical ointment Percent of Wound Bed Debrided: 100% T Area Debrided (cm): otal 0.31 Tissue and other material debrided: Non-Viable, Slough, Slough Level: Non-Viable Tissue Debridement Description: Selective/Open Wound Instrument: Curette Bleeding: Minimum Hemostasis Achieved: Pressure Response to Treatment: Procedure was tolerated well Level of Consciousness (Post- Awake and Alert procedure): Post Debridement Measurements  of Total Wound Length: (cm) 1.3 Width: (cm) 0.3 Depth: (cm) 0.1 Volume: (cm) 0.031 Character of Wound/Ulcer Post Debridement: Requires Further Debridement Post Procedure Diagnosis Same as Tracey, Walker ([id number]) [id number] Page 2 of 8 Notes Scribed for Dr. Lady Gary by Tommie Ard, RN Electronic Signature(s) Signed: 04/11/2023 10:48:59 AM By: Duanne Guess MD FACS Signed: 04/25/2023 2:13:46 PM By: Tommie Ard RN Entered By: Tommie Ard on 04/11/2023 10:06:12 -------------------------------------------------------------------------------- HPI Details Patient Name: Date of Service: [name], [name] 04/11/2023 10:15 Tracey M Medical Record Number: [id number] Patient Account Number: [id number] Date of Birth/Sex: Treating RN: 10-23-1980 (42 y.o. F) Primary Care Provider: Gailen Shelter Other Clinician: Referring Provider: Treating Provider/Extender: Darnelle Spangle, TRIA D Weeks in Treatment: 1 History of Present Illness HPI Description: ADMISSION 04/04/2023 This is Tracey 42 year old poorly controlled type II diabetic (last hemoglobin A1c 8.2%) who is Tracey bilateral above-knee amputee, blind in her right eye, with end-stage renal disease on hemodialysis. She presented to the emergency department on August 4 complaining of pain and swelling in her right arm. She was noted to have an abscess on her right posterior shoulder. An attempt was made at incision and drainage, but the site could not be completely evacuated secondary to patient discomfort. She was given Tracey course of clindamycin. I do not see that any culture was obtained at that time. She was referred to the wound care center for further evaluation and management of the site. 04/11/2023: The wound is smaller today. Better granulation tissue is starting to emerge, but there is still some fibrotic slough on the surface. Electronic Signature(s) Signed: 04/11/2023 10:09:24 AM By: Duanne Guess MD  FACS Entered By: Duanne Guess on 04/11/2023 10:09:24 -------------------------------------------------------------------------------- Physical Exam Details Patient Name: Date of Service: [name], [name] 04/11/2023 10:15 Tracey M Medical Record Number: [id number] Patient Account Number: [id number] Date of Birth/Sex: Treating RN: [birth year] (42 y.o. F) Primary Care Provider: Clovia Cuff D Other Clinician: Referring Provider: Treating Provider/Extender: Duanne [employer], TRIA D Weeks in Treatment: 1 Constitutional . . . . no acute  distress. Respiratory Normal work of breathing on room air. Notes 04/11/2023: The wound is smaller today. Better granulation tissue is starting to emerge, but there is still some fibrotic slough on the surface. Electronic Signature(s) Signed: 04/11/2023 10:10:03 AM By: Duanne Guess MD FACS Entered By: Duanne Guess on 04/11/2023 10:10:03 Rene Paci ([id number]) [id number] Page 3 of 8 -------------------------------------------------------------------------------- Physician Orders Details Patient Name: Date of Service: [name] 04/11/2023 10:15 Tracey M Medical Record Number: [id number] Patient Account Number: [id number] Date of Birth/Sex: Treating RN: Jul 01, 1981 (42 y.o. F) Zochol, Jamie Primary Care Provider: Clovia Cuff D Other Clinician: Referring Provider: Treating Provider/Extender: Darnelle Spangle, TRIA D Weeks in Treatment: 1 Verbal / Phone Orders: No Diagnosis Coding ICD-10 Coding Code Description L98.492 Non-pressure chronic ulcer of skin of other sites with fat layer exposed L02.413 Cutaneous abscess of right upper limb E11.622 Type 2 diabetes mellitus with other skin ulcer I73.9 Peripheral vascular disease, unspecified N18.6 End stage renal disease E46 Unspecified protein-calorie malnutrition Z89.611 Acquired absence of right leg above knee Z89.612 Acquired absence of left leg above knee Follow-up  Appointments ppointment in 1 week. - Dr. Lady Gary Rm 3 Return Tracey Anesthetic (In clinic) Topical Lidocaine 5% applied to wound bed - as needed for debridement Bathing/ Shower/ Hygiene May shower with protection but do not get wound dressing(s) wet. Protect dressing(s) with water repellant cover (for example, large plastic bag) or Tracey cast cover and may then take shower. - Change dressing daily; may shower daily and then change dressing Wound Treatment Wound #1 - Shoulder Wound Laterality: Right, Anterior Cleanser: Soap and Water 1 x Per Day/30 Days Discharge Instructions: May shower and wash wound with dial antibacterial soap and water prior to dressing change. Peri-Wound Care: Skin Prep (Generic) 1 x Per Day/30 Days Discharge Instructions: Use skin prep as directed Prim Dressing: MediHoney Gel, tube 1.5 (oz) 1 x Per Day/30 Days ary Discharge Instructions: Apply to wound bed as instructed Secondary Dressing: Zetuvit Plus Silicone Border Dressing 4x4 (in/in) (Generic) 1 x Per Day/30 Days Discharge Instructions: Apply silicone border over primary dressing as directed. Electronic Signature(s) Signed: 04/11/2023 10:10:14 AM By: Duanne Guess MD FACS Entered By: Duanne Guess on 04/11/2023 10:10:14 -------------------------------------------------------------------------------- Problem List Details Patient Name: Date of Service: [name], [name] 04/11/2023 10:15 Tracey M Medical Record Number: [id number] Patient Account Number: [id number] Date of Birth/Sex: Treating RN: 08/24/1980 (42 y.o. F) Primary Care Provider: Gailen Shelter Other ClinicianBRYTTANY, Walker ([id number]) [id number] Page 4 of 8 Referring Provider: Treating Provider/Extender: Darnelle Spangle, TRIA D Weeks in Treatment: 1 Active Problems ICD-10 Encounter Code Description Active Date MDM Diagnosis L98.492 Non-pressure chronic ulcer of skin of other sites with fat layer exposed 04/04/2023 No  Yes L02.413 Cutaneous abscess of right upper limb 04/04/2023 No Yes E11.622 Type 2 diabetes mellitus with other skin ulcer 04/04/2023 No Yes I73.9 Peripheral vascular disease, unspecified 04/04/2023 No Yes N18.6 End stage renal disease 04/04/2023 No Yes E46 Unspecified protein-calorie malnutrition 04/04/2023 No Yes Z89.611 Acquired absence of right leg above knee 04/04/2023 No Yes Z89.612 Acquired absence of left leg above knee 04/04/2023 No Yes Inactive Problems Resolved Problems Electronic Signature(s) Signed: 04/11/2023 10:07:13 AM By: Duanne Guess MD FACS Entered By: Duanne Guess on 04/11/2023 10:07:13 -------------------------------------------------------------------------------- Progress Note Details Patient Name: Date of Service: [name], [name] 04/11/2023 10:15 Tracey M Medical Record Number: [id number] Patient Account Number: [id number] Date of Birth/Sex: Treating RN: [birth year] (42 y.o. F) Primary Care Provider: Gailen Shelter Other Clinician: Referring Provider:  Treating Provider/Extender: Duanne [employer], TRIA D Weeks in Treatment: 1 Subjective Chief Complaint Information obtained from Patient Patient seen for complaints of Non-Healing Wound. History of Present Illness (HPI) ADMISSION Tracey, Walker ([id number]) [id number] Page 5 of 8 04/04/2023 This is Tracey 42 year old poorly controlled type II diabetic (last hemoglobin A1c 8.2%) who is Tracey bilateral above-knee amputee, blind in her right eye, with end-stage renal disease on hemodialysis. She presented to the emergency department on August 4 complaining of pain and swelling in her right arm. She was noted to have an abscess on her right posterior shoulder. An attempt was made at incision and drainage, but the site could not be completely evacuated secondary to patient discomfort. She was given Tracey course of clindamycin. I do not see that any culture was obtained at that time. She was referred to the  wound care center for further evaluation and management of the site. 04/11/2023: The wound is smaller today. Better granulation tissue is starting to emerge, but there is still some fibrotic slough on the surface. Patient History Medical History Endocrine Patient has history of Type II Diabetes Genitourinary Patient has history of End Stage Renal Disease Medical Tracey Surgical History Notes nd Eyes Blind in right eye Musculoskeletal Guillain Barre Neurologic CVA Psychiatric Anxiety Objective Constitutional no acute distress. Vitals Time Taken: 9:50 AM, Height: 61 in, Temperature: 98.9 F, Pulse: 93 bpm, Respiratory Rate: 18 breaths/min, Blood Pressure: 130/45 mmHg. Respiratory Normal work of breathing on room air. General Notes: 04/11/2023: The wound is smaller today. Better granulation tissue is starting to emerge, but there is still some fibrotic slough on the surface. Integumentary (Hair, Skin) Wound #1 status is Open. Original cause of wound was Gradually Appeared. The date acquired was: 03/28/2023. The wound has been in treatment 1 weeks. The wound is located on the Right,Anterior Shoulder. The wound measures 1.3cm length x 0.3cm width x 0.1cm depth; 0.306cm^2 area and 0.031cm^3 volume. There is Fat Layer (Subcutaneous Tissue) exposed. There is no tunneling or undermining noted. There is Tracey medium amount of serosanguineous drainage noted. The wound margin is distinct with the outline attached to the wound base. There is medium (34-66%) red, pink granulation within the wound bed. There is Tracey medium (34-66%) amount of necrotic tissue within the wound bed including Adherent Slough. The periwound skin appearance had no abnormalities noted for texture. The periwound skin appearance had no abnormalities noted for color. The periwound skin appearance did not exhibit: Dry/Scaly, Maceration. Assessment Active Problems ICD-10 Non-pressure chronic ulcer of skin of other sites with fat layer  exposed Cutaneous abscess of right upper limb Type 2 diabetes mellitus with other skin ulcer Peripheral vascular disease, unspecified End stage renal disease Unspecified protein-calorie malnutrition Acquired absence of right leg above knee Acquired absence of left leg above knee Procedures Wound #1 Pre-procedure diagnosis of Wound #1 is Tracey Cyst located on the Right,Anterior Shoulder . There was Tracey Selective/Open Wound Non-Viable Tissue Debridement with Tracey total area of 0.31 sq cm performed by Duanne Guess, MD. With the following instrument(s): Curette to remove Non-Viable tissue/material. Material removed includes [facility] after achieving pain control using Lidocaine 5% topical ointment. No specimens were taken. Tracey time out was conducted at 10:04, prior to the start of the procedure. Tracey Minimum amount of bleeding was controlled with Pressure. The procedure was tolerated well. Post Debridement Measurements: Tracey, Walker ([id number]) [id number] Page 6 of 8 1.3cm length x 0.3cm width x 0.1cm depth; 0.031cm^3 volume. Character of Wound/Ulcer Post Debridement requires further debridement. Post  procedure Diagnosis Wound #1: Same as Pre-Procedure General Notes: Scribed for Dr. Lady Gary by Tommie Ard, RN. Plan Follow-up Appointments: Return Appointment in 1 week. - Dr. Lady Gary Rm 3 Anesthetic: (In clinic) Topical Lidocaine 5% applied to wound bed - as needed for debridement Bathing/ Shower/ Hygiene: May shower with protection but do not get wound dressing(s) wet. Protect dressing(s) with water repellant cover (for example, large plastic bag) or Tracey cast cover and may then take shower. - Change dressing daily; may shower daily and then change dressing WOUND #1: - Shoulder Wound Laterality: Right, Anterior Cleanser: Soap and Water 1 x Per Day/30 Days Discharge Instructions: May shower and wash wound with dial antibacterial soap and water prior to dressing  change. Peri-Wound Care: Skin Prep (Generic) 1 x Per Day/30 Days Discharge Instructions: Use skin prep as directed Prim Dressing: MediHoney Gel, tube 1.5 (oz) 1 x Per Day/30 Days ary Discharge Instructions: Apply to wound bed as instructed Secondary Dressing: Zetuvit Plus Silicone Border Dressing 4x4 (in/in) (Generic) 1 x Per Day/30 Days Discharge Instructions: Apply silicone border over primary dressing as directed. 04/11/2023: The wound is smaller today. Better granulation tissue is starting to emerge, but there is still some fibrotic slough on the surface. I used Tracey curette to debride slough from the wound surface. She will continue to benefit from ongoing debridement with Medihoney to continue to improve the surface of the wound and remove this fibrotic material. We will continue daily Medihoney dressing changes with Tracey foam border to cover. Follow-up in 1 week. Electronic Signature(s) Signed: 04/11/2023 10:11:20 AM By: Duanne Guess MD FACS Entered By: Duanne Guess on 04/11/2023 10:11:20 -------------------------------------------------------------------------------- HxROS Details Patient Name: Date of Service: [name], [name] 04/11/2023 10:15 Tracey M Medical Record Number: [id number] Patient Account Number: [id number] Date of Birth/Sex: Treating RN: Dec 27, 1980 (42 y.o. F) Primary Care Provider: Gailen Shelter Other Clinician: Referring Provider: Treating Provider/Extender: Duanne [employer], TRIA D Weeks in Treatment: 1 Eyes Medical History: Past Medical History Notes: Blind in right eye Endocrine Medical History: Positive for: Type II Diabetes Genitourinary Medical History: Positive for: End Stage Renal Disease Musculoskeletal Medical History: Past Medical History Notes: Tracey, TOOLEY ([id number]) [id number] Page 7 of 8 Neurologic Medical History: Past Medical History Notes: CVA Psychiatric Medical History: Past Medical  History Notes: Anxiety Immunizations Implantable Devices No devices added Electronic Signature(s) Signed: 04/11/2023 10:48:59 AM By: Duanne Guess MD FACS Entered By: Duanne Guess on 04/11/2023 10:09:40 -------------------------------------------------------------------------------- SuperBill Details Patient Name: Date of Service: [name], [name] 04/11/2023 Medical Record Number: [id number] Patient Account Number: [id number] Date of Birth/Sex: Treating RN: [birth year] (42 y.o. F) Primary Care Provider: Clovia Cuff D Other Clinician: Referring Provider: Treating Provider/Extender: Darnelle Spangle, TRIA D Weeks in Treatment: 1 Diagnosis Coding ICD-10 Codes Code Description [phone] Non-pressure chronic ulcer of skin of other sites with fat layer exposed L02.413 Cutaneous abscess of right upper limb E11.622 Type 2 diabetes mellitus with other skin ulcer I73.9 Peripheral vascular disease, unspecified N18.6 End stage renal disease E46 Unspecified protein-calorie malnutrition Z89.611 Acquired absence of right leg above knee Z89.612 Acquired absence of left leg above knee Facility Procedures : CPT4 Code: [id number] Description: 97597 - DEBRIDE WOUND 1ST 20 SQ CM OR < ICD-10 Diagnosis Description L98.492 Non-pressure chronic ulcer of skin of other sites with fat layer exposed Modifier: Quantity: 1 Physician Procedures : CPT4 Code Description Modifier [id number] - WC PHYS LEVEL 3 - EST PT 25 ICD-10 Diagnosis Description L98.492 Non-pressure chronic ulcer of skin  of other sites with fat layer exposed L02.413 Cutaneous abscess of right upper limb E11.622 Type 2  diabetes mellitus with other skin ulcer I73.9 Peripheral vascular disease, unspecified Quantity: 1 : [id number] 97597 - WC PHYS DEBR WO ANESTH 20 SQ CM Walker, Tracey ([id number]) [id number] ICD-10 Diagnosis Description L98.492 Non-pressure chronic ulcer of skin of other sites with fat layer  exposed Quantity: 1 [id number].pdf Page 8 of 8 Electronic Signature(s) Signed: 04/11/2023 10:11:37 AM By: Duanne Guess MD FACS Entered By: Duanne Guess on 04/11/2023 10:11:37

## 2023-04-30 ENCOUNTER — Emergency Department (HOSPITAL_COMMUNITY): Payer: Medicare Other

## 2023-04-30 ENCOUNTER — Inpatient Hospital Stay (HOSPITAL_COMMUNITY)
Admission: EM | Admit: 2023-04-30 | Discharge: 2023-05-24 | DRG: 853 | Disposition: A | Discharge status: Skilled Nursing Facility | Payer: Medicare Other | Source: Skilled Nursing Facility | Attending: Family Medicine | Admitting: Family Medicine

## 2023-04-30 ENCOUNTER — Encounter (HOSPITAL_COMMUNITY): Payer: Self-pay | Admitting: Internal Medicine

## 2023-04-30 ENCOUNTER — Other Ambulatory Visit: Payer: Self-pay

## 2023-04-30 DIAGNOSIS — R6521 Severe sepsis with septic shock: Secondary | ICD-10-CM | POA: Diagnosis not present

## 2023-04-30 DIAGNOSIS — N2581 Secondary hyperparathyroidism of renal origin: Secondary | ICD-10-CM | POA: Diagnosis present

## 2023-04-30 DIAGNOSIS — R198 Other specified symptoms and signs involving the digestive system and abdomen: Secondary | ICD-10-CM | POA: Diagnosis not present

## 2023-04-30 DIAGNOSIS — Z1152 Encounter for screening for COVID-19: Secondary | ICD-10-CM | POA: Diagnosis not present

## 2023-04-30 DIAGNOSIS — T82818A Embolism of vascular prosthetic devices, implants and grafts, initial encounter: Secondary | ICD-10-CM | POA: Diagnosis not present

## 2023-04-30 DIAGNOSIS — Z515 Encounter for palliative care: Secondary | ICD-10-CM | POA: Diagnosis not present

## 2023-04-30 DIAGNOSIS — E1051 Type 1 diabetes mellitus with diabetic peripheral angiopathy without gangrene: Secondary | ICD-10-CM | POA: Diagnosis present

## 2023-04-30 DIAGNOSIS — Z89612 Acquired absence of left leg above knee: Secondary | ICD-10-CM | POA: Diagnosis not present

## 2023-04-30 DIAGNOSIS — Z91013 Allergy to seafood: Secondary | ICD-10-CM

## 2023-04-30 DIAGNOSIS — G610 Guillain-Barre syndrome: Secondary | ICD-10-CM | POA: Diagnosis present

## 2023-04-30 DIAGNOSIS — N186 End stage renal disease: Secondary | ICD-10-CM | POA: Diagnosis present

## 2023-04-30 DIAGNOSIS — E1022 Type 1 diabetes mellitus with diabetic chronic kidney disease: Secondary | ICD-10-CM | POA: Diagnosis present

## 2023-04-30 DIAGNOSIS — M79601 Pain in right arm: Secondary | ICD-10-CM | POA: Diagnosis not present

## 2023-04-30 DIAGNOSIS — R509 Fever, unspecified: Secondary | ICD-10-CM | POA: Diagnosis not present

## 2023-04-30 DIAGNOSIS — Z789 Other specified health status: Secondary | ICD-10-CM | POA: Diagnosis not present

## 2023-04-30 DIAGNOSIS — K55069 Acute infarction of intestine, part and extent unspecified: Secondary | ICD-10-CM | POA: Diagnosis present

## 2023-04-30 DIAGNOSIS — M898X9 Other specified disorders of bone, unspecified site: Secondary | ICD-10-CM | POA: Diagnosis present

## 2023-04-30 DIAGNOSIS — Y832 Surgical operation with anastomosis, bypass or graft as the cause of abnormal reaction of the patient, or of later complication, without mention of misadventure at the time of the procedure: Secondary | ICD-10-CM | POA: Diagnosis not present

## 2023-04-30 DIAGNOSIS — K859 Acute pancreatitis without necrosis or infection, unspecified: Secondary | ICD-10-CM | POA: Diagnosis not present

## 2023-04-30 DIAGNOSIS — K353 Acute appendicitis with localized peritonitis, without perforation or gangrene: Secondary | ICD-10-CM | POA: Diagnosis present

## 2023-04-30 DIAGNOSIS — R627 Adult failure to thrive: Secondary | ICD-10-CM | POA: Diagnosis not present

## 2023-04-30 DIAGNOSIS — Z91010 Allergy to peanuts: Secondary | ICD-10-CM

## 2023-04-30 DIAGNOSIS — Z885 Allergy status to narcotic agent status: Secondary | ICD-10-CM

## 2023-04-30 DIAGNOSIS — K5651 Intestinal adhesions [bands], with partial obstruction: Secondary | ICD-10-CM | POA: Diagnosis present

## 2023-04-30 DIAGNOSIS — Z833 Family history of diabetes mellitus: Secondary | ICD-10-CM

## 2023-04-30 DIAGNOSIS — K9413 Enterostomy malfunction: Secondary | ICD-10-CM | POA: Diagnosis not present

## 2023-04-30 DIAGNOSIS — L89152 Pressure ulcer of sacral region, stage 2: Secondary | ICD-10-CM | POA: Diagnosis present

## 2023-04-30 DIAGNOSIS — K56609 Unspecified intestinal obstruction, unspecified as to partial versus complete obstruction: Secondary | ICD-10-CM | POA: Diagnosis not present

## 2023-04-30 DIAGNOSIS — Z89611 Acquired absence of right leg above knee: Secondary | ICD-10-CM | POA: Diagnosis not present

## 2023-04-30 DIAGNOSIS — Z79899 Other long term (current) drug therapy: Secondary | ICD-10-CM

## 2023-04-30 DIAGNOSIS — Z992 Dependence on renal dialysis: Secondary | ICD-10-CM

## 2023-04-30 DIAGNOSIS — E872 Acidosis, unspecified: Secondary | ICD-10-CM | POA: Diagnosis present

## 2023-04-30 DIAGNOSIS — R54 Age-related physical debility: Secondary | ICD-10-CM | POA: Diagnosis present

## 2023-04-30 DIAGNOSIS — E785 Hyperlipidemia, unspecified: Secondary | ICD-10-CM | POA: Diagnosis present

## 2023-04-30 DIAGNOSIS — Z89511 Acquired absence of right leg below knee: Secondary | ICD-10-CM

## 2023-04-30 DIAGNOSIS — A419 Sepsis, unspecified organism: Principal | ICD-10-CM | POA: Diagnosis present

## 2023-04-30 DIAGNOSIS — K8020 Calculus of gallbladder without cholecystitis without obstruction: Secondary | ICD-10-CM | POA: Diagnosis present

## 2023-04-30 DIAGNOSIS — Z7189 Other specified counseling: Secondary | ICD-10-CM | POA: Diagnosis not present

## 2023-04-30 DIAGNOSIS — H5461 Unqualified visual loss, right eye, normal vision left eye: Secondary | ICD-10-CM | POA: Diagnosis present

## 2023-04-30 DIAGNOSIS — K559 Vascular disorder of intestine, unspecified: Secondary | ICD-10-CM | POA: Diagnosis present

## 2023-04-30 DIAGNOSIS — I120 Hypertensive chronic kidney disease with stage 5 chronic kidney disease or end stage renal disease: Secondary | ICD-10-CM | POA: Diagnosis present

## 2023-04-30 DIAGNOSIS — E875 Hyperkalemia: Secondary | ICD-10-CM | POA: Diagnosis not present

## 2023-04-30 DIAGNOSIS — I2510 Atherosclerotic heart disease of native coronary artery without angina pectoris: Secondary | ICD-10-CM | POA: Diagnosis present

## 2023-04-30 DIAGNOSIS — Z8673 Personal history of transient ischemic attack (TIA), and cerebral infarction without residual deficits: Secondary | ICD-10-CM

## 2023-04-30 DIAGNOSIS — Z794 Long term (current) use of insulin: Secondary | ICD-10-CM

## 2023-04-30 DIAGNOSIS — R0902 Hypoxemia: Secondary | ICD-10-CM | POA: Diagnosis not present

## 2023-04-30 DIAGNOSIS — E10649 Type 1 diabetes mellitus with hypoglycemia without coma: Secondary | ICD-10-CM | POA: Diagnosis not present

## 2023-04-30 DIAGNOSIS — E440 Moderate protein-calorie malnutrition: Secondary | ICD-10-CM | POA: Diagnosis not present

## 2023-04-30 DIAGNOSIS — D631 Anemia in chronic kidney disease: Secondary | ICD-10-CM | POA: Diagnosis present

## 2023-04-30 DIAGNOSIS — K562 Volvulus: Secondary | ICD-10-CM | POA: Diagnosis present

## 2023-04-30 DIAGNOSIS — F419 Anxiety disorder, unspecified: Secondary | ICD-10-CM | POA: Diagnosis present

## 2023-04-30 DIAGNOSIS — I252 Old myocardial infarction: Secondary | ICD-10-CM

## 2023-04-30 DIAGNOSIS — Z7982 Long term (current) use of aspirin: Secondary | ICD-10-CM

## 2023-04-30 DIAGNOSIS — T82510A Breakdown (mechanical) of surgically created arteriovenous fistula, initial encounter: Secondary | ICD-10-CM | POA: Diagnosis not present

## 2023-04-30 DIAGNOSIS — Z6821 Body mass index (BMI) 21.0-21.9, adult: Secondary | ICD-10-CM

## 2023-04-30 DIAGNOSIS — Z91018 Allergy to other foods: Secondary | ICD-10-CM

## 2023-04-30 LAB — CBC WITH DIFFERENTIAL/PLATELET
Abs Immature Granulocytes: 0.03 10*3/uL (ref 0.00–0.07)
Basophils Absolute: 0 10*3/uL (ref 0.0–0.1)
Basophils Relative: 0 %
Eosinophils Absolute: 0.1 10*3/uL (ref 0.0–0.5)
Eosinophils Relative: 1 %
HCT: 35 % — ABNORMAL LOW (ref 36.0–46.0)
Hemoglobin: 10.1 g/dL — ABNORMAL LOW (ref 12.0–15.0)
Immature Granulocytes: 0 %
Lymphocytes Relative: 11 %
Lymphs Abs: 1.1 10*3/uL (ref 0.7–4.0)
MCH: 25.7 pg — ABNORMAL LOW (ref 26.0–34.0)
MCHC: 28.9 g/dL — ABNORMAL LOW (ref 30.0–36.0)
MCV: 89.1 fL (ref 80.0–100.0)
Monocytes Absolute: 0.7 10*3/uL (ref 0.1–1.0)
Monocytes Relative: 7 %
Neutro Abs: 7.9 10*3/uL — ABNORMAL HIGH (ref 1.7–7.7)
Neutrophils Relative %: 81 %
Platelets: 199 10*3/uL (ref 150–400)
RBC: 3.93 MIL/uL (ref 3.87–5.11)
RDW: 19 % — ABNORMAL HIGH (ref 11.5–15.5)
WBC: 9.7 10*3/uL (ref 4.0–10.5)
nRBC: 0.2 % (ref 0.0–0.2)

## 2023-04-30 LAB — COMPREHENSIVE METABOLIC PANEL
ALT: 16 U/L (ref 0–44)
AST: 21 U/L (ref 15–41)
Albumin: 2.5 g/dL — ABNORMAL LOW (ref 3.5–5.0)
Alkaline Phosphatase: 92 U/L (ref 38–126)
Anion gap: 18 — ABNORMAL HIGH (ref 5–15)
BUN: 46 mg/dL — ABNORMAL HIGH (ref 6–20)
CO2: 26 mmol/L (ref 22–32)
Calcium: 9 mg/dL (ref 8.9–10.3)
Chloride: 94 mmol/L — ABNORMAL LOW (ref 98–111)
Creatinine, Ser: 5.74 mg/dL — ABNORMAL HIGH (ref 0.44–1.00)
GFR, Estimated: 9 mL/min — ABNORMAL LOW (ref >60)
Glucose, Bld: 228 mg/dL — ABNORMAL HIGH (ref 70–99)
Potassium: 4.8 mmol/L (ref 3.5–5.1)
Sodium: 138 mmol/L (ref 135–145)
Total Bilirubin: 0.7 mg/dL (ref 0.3–1.2)
Total Protein: 6.9 g/dL (ref 6.5–8.1)

## 2023-04-30 LAB — I-STAT CG4 LACTIC ACID, ED
Lactic Acid, Venous: 1.9 mmol/L (ref 0.5–1.9)
Lactic Acid, Venous: 2.5 mmol/L (ref 0.5–1.9)

## 2023-04-30 LAB — CBG MONITORING, ED: Glucose-Capillary: 192 mg/dL — ABNORMAL HIGH (ref 70–99)

## 2023-04-30 LAB — HCG, SERUM, QUALITATIVE: Preg, Serum: NEGATIVE

## 2023-04-30 LAB — GLUCOSE, CAPILLARY
Glucose-Capillary: 168 mg/dL — ABNORMAL HIGH (ref 70–99)
Glucose-Capillary: 108 mg/dL — ABNORMAL HIGH (ref 70–99)
Glucose-Capillary: 133 mg/dL — ABNORMAL HIGH (ref 70–99)

## 2023-04-30 LAB — APTT: aPTT: 32 s (ref 24–36)

## 2023-04-30 LAB — HEMOGLOBIN A1C
Hgb A1c MFr Bld: 5.7 % — ABNORMAL HIGH (ref 4.8–5.6)
Mean Plasma Glucose: 116.89 mg/dL

## 2023-04-30 LAB — PROTIME-INR
INR: 1.3 — ABNORMAL HIGH (ref 0.8–1.2)
Prothrombin Time: 16.5 s — ABNORMAL HIGH (ref 11.4–15.2)

## 2023-04-30 LAB — LACTIC ACID, PLASMA: Lactic Acid, Venous: 1.7 mmol/L (ref 0.5–1.9)

## 2023-04-30 LAB — SARS CORONAVIRUS 2 BY RT PCR: SARS Coronavirus 2 by RT PCR: NEGATIVE

## 2023-04-30 MED ORDER — BISACODYL 10 MG RE SUPP: 10.0000 mg | Freq: Every day | RECTAL | Status: DC | PRN

## 2023-04-30 MED ORDER — ACETAMINOPHEN 650 MG RE SUPP: 650.0000 mg | Freq: Four times a day (QID) | RECTAL | Status: DC | PRN

## 2023-04-30 MED ORDER — HYDROMORPHONE HCL 1 MG/ML IJ SOLN
0.5000 mg | Freq: Once | INTRAMUSCULAR | Status: AC
  Administered 2023-04-30: 0.5 mg via INTRAVENOUS
  Filled 2023-04-30: qty 1

## 2023-04-30 MED ORDER — SODIUM CHLORIDE 0.9 % IV SOLN: INTRAVENOUS | Status: DC

## 2023-04-30 MED ORDER — ONDANSETRON HCL 4 MG/2ML IJ SOLN
4.0000 mg | Freq: Four times a day (QID) | INTRAMUSCULAR | Status: DC | PRN
  Administered 2023-05-17 (×2): 4 mg via INTRAVENOUS
  Filled 2023-04-30 (×2): qty 2

## 2023-04-30 MED ORDER — HEPARIN SODIUM (PORCINE) 5000 UNIT/ML IJ SOLN
5000.0000 [IU] | Freq: Three times a day (TID) | INTRAMUSCULAR | Status: DC
  Administered 2023-04-30 – 2023-05-24 (×66): 5000 [IU] via SUBCUTANEOUS
  Filled 2023-04-30 (×69): qty 1

## 2023-04-30 MED ORDER — ONDANSETRON HCL 4 MG PO TABS
4.0000 mg | ORAL_TABLET | Freq: Four times a day (QID) | ORAL | Status: DC | PRN
  Administered 2023-05-21: 4 mg via ORAL
  Filled 2023-04-30: qty 1

## 2023-04-30 MED ORDER — CIPROFLOXACIN IN D5W 400 MG/200ML IV SOLN
400.0000 mg | INTRAVENOUS | Status: DC
  Administered 2023-04-30: 400 mg via INTRAVENOUS
  Filled 2023-04-30 (×2): qty 200

## 2023-04-30 MED ORDER — CHLORHEXIDINE GLUCONATE CLOTH 2 % EX PADS
6.0000 | MEDICATED_PAD | Freq: Every day | CUTANEOUS | Status: DC
  Administered 2023-05-01 (×2): 6 via TOPICAL

## 2023-04-30 MED ORDER — INSULIN ASPART 100 UNIT/ML IJ SOLN
0.0000 [IU] | INTRAMUSCULAR | Status: DC
  Administered 2023-04-30 (×2): 2 [IU] via SUBCUTANEOUS
  Administered 2023-04-30 – 2023-05-02 (×5): 1 [IU] via SUBCUTANEOUS
  Administered 2023-05-03: 2 [IU] via SUBCUTANEOUS
  Administered 2023-05-04 (×2): 1 [IU] via SUBCUTANEOUS
  Administered 2023-05-04 (×2): 2 [IU] via SUBCUTANEOUS
  Administered 2023-05-04: 3 [IU] via SUBCUTANEOUS
  Administered 2023-05-05 (×5): 2 [IU] via SUBCUTANEOUS
  Administered 2023-05-07 – 2023-05-09 (×2): 1 [IU] via SUBCUTANEOUS

## 2023-04-30 MED ORDER — HYDRALAZINE HCL 20 MG/ML IJ SOLN: 5.0000 mg | Freq: Four times a day (QID) | INTRAMUSCULAR | Status: DC | PRN

## 2023-04-30 MED ORDER — ONDANSETRON HCL 4 MG/2ML IJ SOLN
4.0000 mg | Freq: Once | INTRAMUSCULAR | Status: AC
  Administered 2023-04-30: 4 mg via INTRAVENOUS
  Filled 2023-04-30: qty 2

## 2023-04-30 MED ORDER — IOHEXOL 350 MG/ML SOLN
75.0000 mL | Freq: Once | INTRAVENOUS | Status: AC | PRN
  Administered 2023-04-30: 75 mL via INTRAVENOUS

## 2023-04-30 MED ORDER — ALBUTEROL SULFATE (2.5 MG/3ML) 0.083% IN NEBU: 2.5000 mg | INHALATION_SOLUTION | RESPIRATORY_TRACT | Status: DC | PRN

## 2023-04-30 MED ORDER — ACETAMINOPHEN 325 MG PO TABS
650.0000 mg | ORAL_TABLET | Freq: Four times a day (QID) | ORAL | Status: DC | PRN
  Administered 2023-05-02 – 2023-05-21 (×12): 650 mg via ORAL
  Filled 2023-04-30 (×12): qty 2

## 2023-04-30 MED ORDER — PANTOPRAZOLE SODIUM 40 MG IV SOLR
40.0000 mg | Freq: Every day | INTRAVENOUS | Status: DC
  Administered 2023-04-30 – 2023-05-09 (×10): 40 mg via INTRAVENOUS
  Filled 2023-04-30 (×10): qty 10

## 2023-04-30 MED ORDER — METRONIDAZOLE 500 MG/100ML IV SOLN
500.0000 mg | Freq: Two times a day (BID) | INTRAVENOUS | Status: DC
  Administered 2023-04-30 – 2023-05-02 (×3): 500 mg via INTRAVENOUS
  Filled 2023-04-30 (×3): qty 100

## 2023-04-30 MED ORDER — HYDROMORPHONE HCL 1 MG/ML IJ SOLN
0.5000 mg | INTRAMUSCULAR | Status: DC | PRN
  Administered 2023-04-30 (×6): 1 mg via INTRAVENOUS
  Filled 2023-04-30 (×6): qty 1

## 2023-04-30 MED ORDER — INSULIN GLARGINE-YFGN 100 UNIT/ML ~~LOC~~ SOLN
5.0000 [IU] | Freq: Two times a day (BID) | SUBCUTANEOUS | Status: DC
  Administered 2023-04-30 (×2): 5 [IU] via SUBCUTANEOUS
  Filled 2023-04-30 (×4): qty 0.05

## 2023-04-30 NOTE — H&P (Signed)
History and Physical    Patient: [name] ION:[id number] DOB: January 19, 1981 DOA: 04/30/2023 DOS: the patient was seen and examined on 04/30/2023 PCP: [facility]  Patient coming from: [facility]  Chief Complaint: Abdominal pain Chief Complaint  Patient presents with   Abdominal Pain   HPI: [name] is a 42 y.o. female with medical history significant of type 1 diabetes mellitus with diabetic retinopathy,, peripheral vascular disease s/p bilateral BKA, ESRD on HD MWF, HTN, HLD, history of CVA, history of chronic anemia, GBS, mesenteric ischemia with colonic pneumatosis May 2024 treated nonoperatively who presented to [facility] ED on 9/17 from [facility] with acute onset abdominal pain.  Patient reports onset yesterday, associated with diarrhea.  No changes in dietary habits.  Further denies fever, no chills, no nausea/vomiting.  Reported normal HD session yesterday.  In the ED, temperature 100.5 F, HR 104, RR 20, BP 138/73, SpO2 95% on 2.5 L nasal cannula.  WBC 9.7, hemoglobin 10.1, platelet count 199.  Sodium 138, potassium 4.8, chloride 94, CO2 26, glucose 228, BUN 46, creatinine 5.74.  AST 21, ALT 16, total bilirubin 0.7.  Lactic acid 1.9 followed by 2.5.  INR 1.3.  Cova-19 PCR negative.  Chest x-ray with atelectasis versus infiltrate left lung base, low lung volumes.  CT abdomen/pelvis with contrast with evidence of small bowel obstruction with dilated ileal segments up to 4 cm, unable to determine transition point but most distal ileal segments decompressed, increased diffuse fold thickening ascending/transverse colon and most distal descending and sigmoid segments consistent with colitis, no pneumatosis or portal venous gas, diffuse gastric wall thickening, cholelithiasis with mild chronic prominence of the common bile duct but no visible filling duct, minimal pelvic ascites.  General surgery was consulted.  TRH consulted for admission for further  evaluation management of small bowel obstruction, colitis.    Review of Systems: As mentioned in the history of present illness. All other systems reviewed and are negative. Past Medical History:  Diagnosis Date   Anemia    Anxiety    Blind right eye 2008   Diabetes mellitus without complication (HCC)    Embolic stroke (HCC)    ESRD (end stage renal disease) (HCC)    HD on M/W/F   ESRD on hemodialysis (HCC)    GBS (Guillain Barre syndrome) (HCC)    Past Surgical History:  Procedure Laterality Date   AMPUTATION Right 08/11/2021   Procedure: TRANSMETATARSAL AMPUTATION OF RIGHT FOOT;  Surgeon: Nada Libman, MD;  Location: [facility] OR;  Service: Vascular;  Laterality: Right;   AMPUTATION Right 09/17/2021   Procedure: AMPUTATION RIGHT BELOW KNEE;  Surgeon: Nada Libman, MD;  Location: [facility] OR;  Service: Vascular;  Laterality: Right;   AMPUTATION Right 10/11/2021   Procedure: RIGHT ABOVE KNEE AMPUTATION;  Surgeon: Nada Libman, MD;  Location: [facility] OR;  Service: Vascular;  Laterality: Right;   AMPUTATION Left 07/02/2022   Procedure: LEFT ABOVE KNEE AMPUTATION;  Surgeon: Chuck Hint, MD;  Location: [facility] OR;  Service: Vascular;  Laterality: Left;   AMPUTATION TOE Left    APPLICATION OF WOUND VAC  08/16/2021   Procedure: APPLICATION OF WOUND VAC;  Surgeon: Nada Libman, MD;  Location: [facility] OR;  Service: Vascular;;   AV FISTULA PLACEMENT     BUBBLE STUDY  10/02/2021   Procedure: BUBBLE STUDY;  Surgeon: Pricilla Riffle, MD;  Location: [facility] ENDOSCOPY;  Service: Cardiovascular;;   CESAREAN SECTION  2011   FISTULA SUPERFICIALIZATION  Right 07/27/2019   Procedure: 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;  Surgeon: Chuck Hint, MD;  Location: [facility] OR;  Service: Vascular;  Laterality: Right;   INSERTION OF DIALYSIS CATHETER Left 07/27/2019   Procedure: INSERTION OF TUNNELED  DIALYSIS CATHETER;  Surgeon: Chuck Hint, MD;  Location: [facility] OR;  Service: Vascular;   Laterality: Left;   INSERTION OF DIALYSIS CATHETER Left 10/23/2021   Procedure: INSERTION OF TUNNELED PALINDROME PRECISION DIALYSIS CATHETER (23cm);  Surgeon: Cephus Shelling, MD;  Location: [facility] OR;  Service: Vascular;  Laterality: Left;   IR FLUORO GUIDE CV LINE LEFT  10/16/2021   IR FLUORO GUIDE CV LINE RIGHT  10/11/2021   IR GASTROSTOMY TUBE MOD SED  10/30/2021   IR GASTROSTOMY TUBE REMOVAL  05/08/2022   IR US GUIDE VASC ACCESS LEFT  10/16/2021   IR US GUIDE VASC ACCESS RIGHT  10/11/2021   LEFT HEART CATH AND CORONARY ANGIOGRAPHY N/A 07/26/2022   Procedure: LEFT HEART CATH AND CORONARY ANGIOGRAPHY;  Surgeon: Swaziland, Peter M, MD;  Location: [facility] INVASIVE CV LAB;  Service: Cardiovascular;  Laterality: N/A;   LOWER EXTREMITY ANGIOGRAPHY N/A 08/11/2021   Procedure: LOWER EXTREMITY ANGIOGRAPHY;  Surgeon: Nada Libman, MD;  Location: [facility] INVASIVE CV LAB;  Service: Cardiovascular;  Laterality: N/A;   REVISON OF ARTERIOVENOUS FISTULA Right 10/23/2021   Procedure: REVISON OF ARTERIOVENOUS FISTULA  ARM AND PLICATION;  Surgeon: Cephus Shelling, MD;  Location: [facility] OR;  Service: Vascular;  Laterality: Right;   TEE WITHOUT CARDIOVERSION N/A 10/02/2021   Procedure: TRANSESOPHAGEAL ECHOCARDIOGRAM (TEE);  Surgeon: Pricilla Riffle, MD;  Location: [facility] ENDOSCOPY;  Service: Cardiovascular;  Laterality: N/A;   TRANSMETATARSAL AMPUTATION Right 08/16/2021   Procedure: CLOSURE OF RIGHT TRANSMETATARSAL AMPUTATION;  Surgeon: Nada Libman, MD;  Location: [facility] OR;  Service: Vascular;  Laterality: Right;   Social History:  reports that she has never smoked. She has never used smokeless tobacco. She reports that she does not drink alcohol and does not use drugs.  Allergies  Allergen Reactions   Morphine Rash and Other (See Comments)   Peanut-Containing Drug Products Anaphylaxis and Hives   Penicillins Rash and Other (See Comments)    Rash in 2008.  Tolerated cefazolin in 2020. No reaction listed on MAR.   Chocolate Hives    Shellfish Allergy Swelling    Family History  Problem Relation Age of Onset   Diabetes Mother    Diabetes Father     Prior to Admission medications   Medication Sig Start Date End Date Taking? Authorizing Provider  Accu-Chek FastClix Lancets MISC Use as instructed to check blood sugar up to TID. E11.22 Patient taking differently: 1 each by Other route See admin instructions. Use as instructed to check blood sugar up to TID. E11.22 04/15/19   Hoy Register, MD  acetaminophen (TYLENOL) 325 MG tablet Take 650 mg by mouth every 6 (six) hours as needed for fever.    [provider]  aspirin EC 81 MG tablet Take 1 tablet (81 mg total) by mouth daily. Swallow whole. 07/27/22   Joseph Art, DO  atorvastatin (LIPITOR) 40 MG tablet Place 1 tablet (40 mg total) into feeding tube daily. 10/26/21   Burnadette Pop, MD  azelastine (OPTIVAR) 0.05 % ophthalmic solution Place 1 drop into both eyes 2 (two) times daily as needed (allergies). 11/13/22   [provider]  Blood Glucose Monitoring Suppl (ACCU-CHEK GUIDE ME) w/Device KIT 1 kit by Does not apply route 3 (  three) times daily. Use to check BG at home up to 3 times daily. E11.22 04/15/19   Hoy Register, MD  cetirizine (ZYRTEC) 10 MG tablet Take 10 mg by mouth daily.    [provider]  cinacalcet (SENSIPAR) 90 MG tablet Take 90 mg by mouth daily. 09/22/19   [provider]  Continuous Blood Gluc Transmit (DEXCOM G6 TRANSMITTER) MISC 1 Device by Does not apply route as directed. 09/12/20   Shamleffer, Konrad Dolores, MD  docusate sodium (COLACE) 100 MG capsule Take 100 mg by mouth at bedtime.    [provider]  dorzolamide-timolol (COSOPT) 22.3-6.8 MG/ML ophthalmic solution Place 1 drop into the left eye 2 (two) times daily. 03/30/21   [provider]  glucose blood (ACCU-CHEK GUIDE) test strip Use as instructed to check blood sugar up to TID. E11.22 Patient taking differently: 1 each by Other route  See admin instructions. Use as instructed to check blood sugar up to TID. E11.22 07/17/19   Fulp, Cammie, MD  insulin aspart (NOVOLOG) 100 UNIT/ML injection Inject 0-6 Units into the skin every 4 (four) hours. Patient taking differently: Inject 0-10 Units into the skin 3 (three) times daily with meals. 121-150=1 unit 152-200=2 units 201-250=3 units 251-300=5 units 301-350=7 units 351-400=10 units 10/25/21   Burnadette Pop, MD  insulin glargine (SEMGLEE) 100 UNIT/ML injection Inject 0.1 mLs (10 Units total) into the skin daily at 6 PM. Patient taking differently: Inject 12 Units into the skin 2 (two) times daily. 12/28/22   Dahal, Melina Schools, MD  insulin glargine-yfgn (SEMGLEE) 100 UNIT/ML Pen Inject into the skin. 01/04/23   [provider]  isosorbide mononitrate (IMDUR) 30 MG 24 hr tablet Take 1 tablet (30 mg total) by mouth daily. 07/27/22   Marlin Canary U, DO  melatonin 3 MG TABS tablet Take 3 mg by mouth at bedtime.    [provider]  metoprolol tartrate (LOPRESSOR) 25 MG tablet Take 0.5 tablets (12.5 mg total) by mouth 2 (two) times daily. 07/27/22   Joseph Art, DO  midodrine (PROAMATINE) 5 MG tablet Take 5 mg by mouth 3 (three) times daily with meals.    [provider]  Multiple Vitamin (MULTIVITAMIN WITH MINERALS) TABS tablet Take 1 tablet by mouth daily.    [provider]  Nutritional Supplements (NUTRITIONAL DRINK) LIQD Take 120 mLs by mouth daily.    [provider]  ondansetron (ZOFRAN) 4 MG tablet Take 4 mg by mouth every 6 (six) hours as needed for nausea/vomiting. 03/02/22   [provider]  oxyCODONE-acetaminophen (PERCOCET) 5-325 MG tablet Take 1 tablet by mouth every 8 (eight) hours as needed for severe pain. 07/27/22   Joseph Art, DO  polyethylene glycol (MIRALAX / GLYCOLAX) 17 g packet Take 17 g by mouth daily at 6 PM.    [provider]  thiamine 100 MG tablet Place 1 tablet (100 mg total) into feeding tube  daily. 10/26/21   Burnadette Pop, MD    Physical Exam: Vitals:   04/30/23 0800 04/30/23 0900 04/30/23 0919 04/30/23 0936  BP:  (!) 143/64  136/80  Pulse:  (!) 104  (!) 110  Resp:  17  18  Temp: (!) 100.5 F (38.1 C)  100.1 F (37.8 C) 100.2 F (37.9 C)  TempSrc: Oral  Oral Oral  SpO2:  95%  97%   Physical Exam GEN: 42 yo female in mild distress due to pain, alert and oriented x 3, chronically ill appearance, appears older than stated age  HEENT: NCAT, PERRL, EOMI, sclera clear, dry mucous membranes PULM: CTAB w/o wheezes/crackles, normal respiratory effort, on 2 L nasal cannula CV: RRR w/o M/G/R GI: abd soft, mild tenderness to palpation in the epigastrium and bilateral lower quadrants, nondistended, + BS, no R/G/M MSK: Noted bilateral lower extremity BKA NEURO: No focal neurological deficits PSYCH: normal mood/affect Integumentary: dry/intact, no rashes or wounds   Assessment and Plan:  Partial small bowel obstruction Patient presenting to ED with acute onset abdominal pain since 9/16.  Patient was noted to be slightly febrile with temperature 100.5 F, no leukocytosis.  Chest x-ray relatively unrevealing.  Lactic acid elevated up to 2.5.  CT abdomen/pelvis with findings consistent with small bowel obstruction. -- General Surgery following, appreciate assistance -- Holding off NG tube placement at this time -- N.p.o., IV fluids -- Repeat abdominal x-ray in the a.m. -- Further per general surgery  Colitis Patient reporting abdominal pain, diarrhea with temperature up to 100.5 F with a lactic acid 2.5.  Denies any changes in dietary habits.  No recent exposure to antibiotics.  No leukocytosis. -- Obtain GI PCR panel -- Enteric precautions -- Start ciprofloxacin and metronidazole -- CBC daily -- Strict I's and O's, monitor frequency of bowel movements  Type 1 diabetes mellitus Hemoglobin A1c 5.7, well-controlled.  Home regimen includes Semglee 12 units subcutaneously  twice daily, NovoLog insulin sliding scale. -- Semglee 5u  q12h -- SSI for coverage -- CBGs q4h while NPO  Peripheral vascular disease s/p bilateral BKA -- Hold statin while NPO  ESRD on HD MWF -- Nephrology following continue hemodialysis inpatient  Essential hypertension Home regimen includes metoprolol tartrate 12.5 mg p.o. twice daily, isosorbide mononitrate 30 mg p.o. daily. -- Hold oral antihypertensives while n.p.o. -- Continue monitor BP  Hyperlipidemia -- Hold home statin  Chronic anemia Hemoglobin 10.1, stable.    Advance Care Planning:   Code Status: Full Code   Consults: General Surgery, nephrology  Family Communication: No family present at bedside this morning  Severity of Illness: The appropriate patient status for this patient is INPATIENT. Inpatient status is judged to be reasonable and necessary in order to provide the required intensity of service to ensure the patient's safety. The patient's presenting symptoms, physical exam findings, and initial radiographic and laboratory data in the context of their chronic comorbidities is felt to place them at high risk for further clinical deterioration. Furthermore, it is not anticipated that the patient will be medically stable for discharge from the hospital within 2 midnights of admission.   * I certify that at the point of admission it is my clinical judgment that the patient will require inpatient hospital care spanning beyond 2 midnights from the point of admission due to high intensity of service, high risk for further deterioration and high frequency of surveillance required.*  Author: Alvira Philips [place], DO 04/30/2023 2:29 PM  For on call review [email or web address].

## 2023-04-30 NOTE — Progress Notes (Signed)
PHARMACY ANTIBIOTIC CONSULT NOTE   [name] a 42 y.o. female with PMH s/f T1DM, PVD with bilateral BKA, ESRD on HD MWF, and mesenteric ischemia with colonic pneumatoisis 12/2022 treated non-operatively. Patient p/w acute abdominal pain .  Pharmacy has been consulted for ciprofloxacin dosing for suspected IAI. CT A/P with evidence of SBO and colitis.   Scr 5.74 (04/30/2023), WBC 9.7 (04/30/2023)  9/17: Scr 5.74, WBC 9.7, LA 2.5  Vital Signs: Tm 100.5, HR elevated, BP WNL  CrCl cannot be calculated (Unknown ideal weight.).  Plan: Start ciprofloxacin 400 mg IV daily (administer after HD on HD days)  Continue flagyl 500 mg IV Q12H    Allergies:  Allergies  Allergen Reactions   Morphine Rash and Other (See Comments)   Peanut-Containing Drug Products Anaphylaxis and Hives   Penicillins Rash and Other (See Comments)    Rash in 2008.  Tolerated cefazolin in 2020. No reaction listed on MAR.   Chocolate Hives   Shellfish Allergy Swelling    There were no vitals filed for this visit.     Latest Ref Rng & Units 04/30/2023    4:47 AM 03/17/2023    4:16 PM 12/28/2022    7:45 AM  CBC  WBC 4.0 - 10.5 K/uL 9.7  13.2  8.4   Hemoglobin 12.0 - 15.0 g/dL 01.0  27.2  7.5   Hematocrit 36.0 - 46.0 % 35.0  36.8  24.0   Platelets 150 - 400 K/uL 199  339  326     Antibiotics Given (last 72 hours)     None       Antimicrobials this admission: Cipro/MTZ 9/17>>c   Microbiology results: 9/17 Bcx: NGTD  9/17 GIPP: sent   Thank you for allowing pharmacy to be a part of this patient's care.  Jani Gravel, PharmD Clinical Pharmacist  04/30/2023 2:44 PM

## 2023-04-30 NOTE — ED Notes (Signed)
ED TO INPATIENT HANDOFF REPORT  ED Nurse Name and Phone #: Marcelino Duster [phone]  S Name/Age/Gender [name] 42 y.o. female Room/Bed: 040C/040C  Code Status   Code Status: Prior  Home/SNF/Other Skilled nursing facility Patient oriented to: self, place, time, and situation Is this baseline? Yes   Triage Complete: Triage complete  Chief Complaint SBO (small bowel obstruction) (HCC) [K56.609]  Triage Note Pt BIB EMS from [facility]. C/o abdominal pain thats been going on all day. Also c/o pain in buttocks. Unrelieved by tylenol.  EMS VS: 130/80, HR 102, 98% RA     Allergies Allergies  Allergen Reactions   Morphine Rash and Other (See Comments)   Peanut-Containing Drug Products Anaphylaxis and Hives   Chocolate Hives   Shellfish Allergy Swelling    Level of Care/Admitting Diagnosis ED Disposition     ED Disposition  Admit   Condition  --   Comment  Hospital Area: [facility] [[id number]]  Level of Care: Med-Surg [[id number]]  May admit patient to [facility] or [facility] if equivalent level of care is available:: No  Covid Evaluation: Asymptomatic - no recent exposure (last 10 days) testing not required  Diagnosis: SBO (small bowel obstruction) [facility]) [[id number]]  Admitting Physician: [place], ERIC J [[id number]]  Attending Physician: [place], ERIC J [[id number]]  Certification:: I certify this patient will need inpatient services for at least 2 midnights  Expected Medical Readiness: 05/03/2023          B Medical/Surgery History Past Medical History:  Diagnosis Date   Anemia    Anxiety    Blind right eye 2008   Diabetes mellitus without complication (HCC)    Embolic stroke (HCC)    ESRD (end stage renal disease) (HCC)    HD on M/W/F   ESRD on hemodialysis (HCC)    GBS (Guillain Barre syndrome) (HCC)    Past Surgical History:  Procedure Laterality Date   AMPUTATION Right 08/11/2021   Procedure: TRANSMETATARSAL AMPUTATION OF RIGHT FOOT;  Surgeon:  Nada Libman, MD;  Location: [facility] OR;  Service: Vascular;  Laterality: Right;   AMPUTATION Right 09/17/2021   Procedure: AMPUTATION RIGHT BELOW KNEE;  Surgeon: Nada Libman, MD;  Location: [facility] OR;  Service: Vascular;  Laterality: Right;   AMPUTATION Right 10/11/2021   Procedure: RIGHT ABOVE KNEE AMPUTATION;  Surgeon: Nada Libman, MD;  Location: [facility] OR;  Service: Vascular;  Laterality: Right;   AMPUTATION Left 07/02/2022   Procedure: LEFT ABOVE KNEE AMPUTATION;  Surgeon: Chuck Hint, MD;  Location: [facility] OR;  Service: Vascular;  Laterality: Left;   AMPUTATION TOE Left    APPLICATION OF WOUND VAC  08/16/2021   Procedure: APPLICATION OF WOUND VAC;  Surgeon: Nada Libman, MD;  Location: [facility] OR;  Service: Vascular;;   AV FISTULA PLACEMENT     BUBBLE STUDY  10/02/2021   Procedure: BUBBLE STUDY;  Surgeon: Pricilla Riffle, MD;  Location: [facility] ENDOSCOPY;  Service: Cardiovascular;;   CESAREAN SECTION  2011   FISTULA SUPERFICIALIZATION Right 07/27/2019   Procedure: 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;  Surgeon: Chuck Hint, MD;  Location: [facility] OR;  Service: Vascular;  Laterality: Right;   INSERTION OF DIALYSIS CATHETER Left 07/27/2019   Procedure: INSERTION OF TUNNELED  DIALYSIS CATHETER;  Surgeon: Chuck Hint, MD;  Location: [facility] OR;  Service: Vascular;  Laterality: Left;   INSERTION OF DIALYSIS CATHETER Left 10/23/2021   Procedure: INSERTION OF TUNNELED PALINDROME PRECISION DIALYSIS CATHETER (23cm);  Surgeon: Cephus Shelling, MD;  Location: [facility] OR;  Service: Vascular;  Laterality: Left;   IR FLUORO GUIDE CV LINE LEFT  10/16/2021   IR FLUORO GUIDE CV LINE RIGHT  10/11/2021   IR GASTROSTOMY TUBE MOD SED  10/30/2021   IR GASTROSTOMY TUBE REMOVAL  05/08/2022   IR US GUIDE VASC ACCESS LEFT  10/16/2021   IR US GUIDE VASC ACCESS RIGHT  10/11/2021   LEFT HEART CATH AND CORONARY ANGIOGRAPHY N/A 07/26/2022   Procedure: LEFT HEART CATH AND CORONARY ANGIOGRAPHY;  Surgeon:  Swazil[place], Peter M, MD;  Location: [facility] INVASIVE CV LAB;  Service: Cardiovascular;  Laterality: N/A;   LOWER EXTREMITY ANGIOGRAPHY N/A 08/11/2021   Procedure: LOWER EXTREMITY ANGIOGRAPHY;  Surgeon: Nada Libman, MD;  [facility]: [facility] INVASIVE CV LAB;  Service: Cardiovascular;  Laterality: N/A;   REVISON OF ARTERIOVENOUS FISTULA Right 10/23/2021   Procedure: REVISON OF ARTERIOVENOUS FISTULA  ARM AND PLICATION;  Surgeon: Cephus Shelling, MD;  Location: [facility] OR;  Service: Vascular;  Laterality: Right;   TEE WITHOUT CARDIOVERSION N/A 10/02/2021   Procedure: TRANSESOPHAGEAL ECHOCARDIOGRAM (TEE);  Surgeon: Pricilla Riffle, MD;  Location: [facility] ENDOSCOPY;  Service: Cardiovascular;  Laterality: N/A;   TRANSMETATARSAL AMPUTATION Right 08/16/2021   Procedure: CLOSURE OF RIGHT TRANSMETATARSAL AMPUTATION;  Surgeon: Nada Libman, MD;  Location[facility] OR;  Service: Vascular;  Laterality: Right;     A IV Location/Drains/Wounds Patient Lines/Drains/Airways Status     Active Line/Drains/Airways     Name Placement date Placement time Site Days   Peripheral IV 04/30/23 20 G Anterior;Left Forearm 04/30/23  0421  Forearm  less than 1   Fistula / Graft Right Upper arm Arteriovenous fistula 02/09/22  1200  Upper arm  445   Pressure Injury 02/10/22 Sacrum Left Stage 2 -  Partial thickness loss of dermis presenting as a shallow open injury with a red, pink wound bed without slough. 02/10/22  0500  -- 444   Wound / Incision (Open or Dehisced) 02/10/22 Other (Comment) Buttocks Left PU stage 2. 4 cm 02/10/22  0500  Buttocks  444   Wound / Incision (Open or Dehisced) 12/20/22 Skin tear Groin Anterior;Proximal;Right 4x 2 12/20/22  1600  Groin  131            Intake/Output Last 24 hours No intake or output data in the 24 hours ending 04/30/23 0842  Labs/Imaging Results for orders placed or performed during the hospital encounter of 04/30/23 (from the past 48 hour(s))  Comprehensive metabolic panel     Status: Abnormal    Collection Time: 04/30/23  4:47 AM  Result Value Ref Range   Sodium 138 135 - 145 mmol/L   Potassium 4.8 3.5 - 5.1 mmol/L   Chloride 94 (L) 98 - 111 mmol/L   CO2 26 22 - 32 mmol/L   Glucose, Bld 228 (H) 70 - 99 mg/dL    Comment: Glucose reference range applies only to samples taken after fasting for at least 8 hours.   BUN 46 (H) 6 - 20 mg/dL   Creatinine, Ser 4.09 (H) 0.44 - 1.00 mg/dL   Calcium 9.0 8.9 - 81.1 mg/dL   Total Protein 6.9 6.5 - 8.1 g/dL   Albumin 2.5 (L) 3.5 - 5.0 g/dL   AST 21 15 - 41 U/L   ALT 16 0 - 44 U/L   Alkaline Phosphatase 92 38 - 126 U/L   Total Bilirubin 0.7 0.3 - 1.2 mg/dL   GFR, Estimated 9 (L) >60 mL/min  Comment: (NOTE) Calculated using the CKD-EPI Creatinine Equation (2021)    Anion gap 18 (H) 5 - 15    Comment: Performed at [facility] Lab, [address] [place], [place] [address]  CBC with Differential     Status: Abnormal   Collection Time: 04/30/23  4:47 AM  Result Value Ref Range   WBC 9.7 4.0 - 10.5 K/uL   RBC 3.93 3.87 - 5.11 MIL/uL   Hemoglobin 10.1 (L) 12.0 - 15.0 g/dL   HCT 29.5 (L) 62.1 - 30.8 %   MCV 89.1 80.0 - 100.0 fL   MCH 25.7 (L) 26.0 - 34.0 pg   MCHC 28.9 (L) 30.0 - 36.0 g/dL   RDW 65.7 (H) 84.6 - 96.2 %   Platelets 199 150 - 400 K/uL   nRBC 0.2 0.0 - 0.2 %   Neutrophils Relative % 81 %   Neutro Abs 7.9 (H) 1.7 - 7.7 K/uL   Lymphocytes Relative 11 %   Lymphs Abs 1.1 0.7 - 4.0 K/uL   Monocytes Relative 7 %   Monocytes Absolute 0.7 0.1 - 1.0 K/uL   Eosinophils Relative 1 %   Eosinophils Absolute 0.1 0.0 - 0.5 K/uL   Basophils Relative 0 %   Basophils Absolute 0.0 0.0 - 0.1 K/uL   Immature Granulocytes 0 %   Abs Immature Granulocytes 0.03 0.00 - 0.07 K/uL    Comment: Performed at [facility], [address] [place], [place] [address]  Protime-INR     Status: Abnormal   Collection Time: 04/30/23  4:47 AM  Result Value Ref Range   Prothrombin Time 16.5 (H) 11.4 - 15.2 seconds   INR 1.3 (H) 0.8 - 1.2     Comment: (NOTE) INR goal varies based on device and disease states. Performed at [facility], [address], [place], [place] [address]   APTT     Status: None   Collection Time: 04/30/23  4:47 AM  Result Value Ref Range   aPTT 32 24 - 36 seconds    Comment: Performed at [facility], [address] [place], [place] [address]  hCG, serum, qualitative     Status: None   Collection Time: 04/30/23  4:47 AM  Result Value Ref Range   Preg, Serum NEGATIVE NEGATIVE    Comment:        THE SENSITIVITY OF THIS METHODOLOGY IS >10 mIU/mL. Performed at [facility], [address] [place], [place] [address]   I-Stat Lactic Acid, ED     Status: None   Collection Time: 04/30/23  5:06 AM  Result Value Ref Range   Lactic Acid, Venous 1.9 0.5 - 1.9 mmol/L  I-Stat Lactic Acid, ED     Status: Abnormal   Collection Time: 04/30/23  7:28 AM  Result Value Ref Range   Lactic Acid, Venous 2.5 (HH) 0.5 - 1.9 mmol/L   Comment NOTIFIED PHYSICIAN    CT ABDOMEN PELVIS W CONTRAST  Result Date: 04/30/2023 CLINICAL DATA:  Generalized abdominal pain. EXAM: CT ABDOMEN AND PELVIS WITH CONTRAST TECHNIQUE: Multidetector CT imaging of the abdomen and pelvis was performed using the standard protocol following bolus administration of intravenous contrast. RADIATION DOSE REDUCTION: This exam was performed according to the departmental dose-optimization program which includes automated exposure control, adjustment of the mA and/or kV according to patient size and/or use of iterative reconstruction technique. CONTRAST:  75mL OMNIPAQUE IOHEXOL 350 MG/ML SOLN COMPARISON:  CTA abdomen and pelvis 12/22/2022, CT  abdomen and pelvis no contrast 02/09/2022. FINDINGS: Lower chest: There is asymmetric with subpleural atelectasis in the left lower lobe possibly positional, with patient left posterior oblique. Lung bases are clear of infiltrates. The cardiac size is normal. There is three-vessel coronary artery  calcification, small hiatal hernia. No pericardial effusion. Hepatobiliary: There is no hepatic mass enhancement. There are stones in the gallbladder without wall thickening. The common bile duct is prominent but no more than previously, measuring 11 mm without visible filling defect. There is only slight central intrahepatic biliary prominence. Pancreas: The gland is partially atrophic with mild prominence in the duct, but this was seen previously. There is no mass enhancement. Spleen: There is a linear hypodensity in the medial aspect of the spleen which was seen previously, preferably reflecting a chronic infarct. There is no mass enhancement. Adrenals/Urinary Tract: Both kidneys show moderate atrophy. 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 There are extensive renovascular calcifications. No collecting system stones or hydronephrosis are seen. There is no adrenal or renal mass enhancement. The bladder is unremarkable for the degree of distention. Stomach/Bowel: There is increased diffuse thickening of the gastric wall greater distally. Consider endoscopic follow-up. The duodenum and jejunum are unremarkable but there is dilatation of ileal small bowel in the mid to lower abdomen up to 4 cm. The exact transition to decompressed caliber and obstructive etiology are not identified but the most distal ileal segments are decompressed. Findings are compatible with small bowel obstruction. Evaluation of the colon demonstrates an upper limit of normal caliber retrocecal appendix without inflammatory change. This is unchanged. There is increased diffuse fold thickening in the ascending and transverse colon and increasingly also in the most distal descending and sigmoid segments consistent with colitis whether inflammatory, infectious or ischemic. There is no wall pneumatosis portal venous gas. Vascular/Lymphatic: There is extensive aortoiliac and visceral  branch vessel calcific atherosclerosis, including the small visceral branch arteries to the mesentery, both kidneys, spleen and liver, especially unusual considering young age. There is no AAA. No enlarged lymph nodes are seen. Reproductive: There are extensive vascular calcifications in the uterus. The uterus and adnexal spaces are otherwise unremarkable. Other: No abdominal wall hernia or abnormality. Minimal pelvic ascites. No drainable pocket. No free air or free hemorrhage. No localizing collections. Musculoskeletal: No acute or significant osseous findings. IMPRESSION: 1. Evidence of small-bowel obstruction with dilated ileal segments up to 4 cm. The exact transition to decompressed caliber in the obstructive etiology not identified but the most distal ileal segments are decompressed. 2. Increased diffuse fold thickening in the ascending and transverse colon and in the most distal descending and sigmoid segments consistent with colitis whether inflammatory, infectious or ischemic. No pneumatosis or portal venous gas. 3. Increased diffuse gastric wall thickening greater distally. Consider endoscopic follow-up. 4. Cholelithiasis with mild chronic prominence of the common bile duct but no visible filling defect. 5. Extensive vascular calcifications including the small visceral branch arteries to the mesentery, both kidneys, spleen and liver, markedly age-advanced. 6. Chronic linear hypodensity in the medial aspect of the spleen, probably a chronic infarct. 7. Small hiatal hernia. 8. 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 9. Minimal pelvic ascites. 10. Aortic and coronary artery atherosclerosis. Aortic Atherosclerosis (ICD10-I70.0). Electronically Signed   By: Almira Bar M.D.   On: 04/30/2023 07:11   DG Chest Port 1 View  Result Date: 04/30/2023 CLINICAL DATA:  Body aches and questionable sepsis EXAM: PORTABLE CHEST 1 VIEW  COMPARISON:  12/22/2022 FINDINGS: Low volume chest with streaky density at the left base. There is no edema, air bronchogram, effusion, or pneumothorax. Stable heart size and mediastinal contours accounting for leftward rotation. IMPRESSION: Atelectasis or infiltrate at the left lung base.  Low lung volumes. Electronically Signed   By: Tiburcio Pea M.D.   On: 04/30/2023 04:49    Pending Labs Unresulted Labs (From admission, onward)     Start     Ordered   04/30/23 0455  SARS Coronavirus 2 by RT PCR (hospital order, performed in [facility] hospital lab) *cepheid single result test* Anterior Nasal Swab  Once,   URGENT        04/30/23 0454   04/30/23 0346  Blood Culture (routine x 2)  (Undifferentiated presentation (screening labs and basic nursing orders))  BLOOD CULTURE X 2,   STAT      04/30/23 0345   04/30/23 0346  Urinalysis, w/ Reflex to Culture (Infection Suspected) -Urine, Clean Catch  (Undifferentiated presentation (screening labs and basic nursing orders))  ONCE - URGENT,   URGENT       Question:  Specimen Source  Answer:  Urine, Clean Catch   04/30/23 0345            Vitals/Pain Today's Vitals   04/30/23 0256 04/30/23 0257 04/30/23 0649 04/30/23 0800  BP:  138/73    Pulse:  98    Resp:  20    Temp:  100.1 F (37.8 C)  (!) 100.5 F (38.1 C)  TempSrc:  Oral  Oral  PainSc: 10-Worst pain ever  8      Isolation Precautions No active isolations  Medications Medications  ondansetron (ZOFRAN) injection 4 mg (4 mg Intravenous Given 04/30/23 0421)  HYDROmorphone (DILAUDID) injection 0.5 mg (0.5 mg Intravenous Given 04/30/23 0422)  HYDROmorphone (DILAUDID) injection 0.5 mg (0.5 mg Intravenous Given 04/30/23 0627)  iohexol (OMNIPAQUE) 350 MG/ML injection 75 mL (75 mLs Intravenous Contrast Given 04/30/23 0641)    Mobility manual wheelchair     Focused Assessments GI:   Patient reports generalized bilateral lower abdominal pain, accompanied by nausea and vomiting.    Resp:    Respirations even and unlabored.   Dialysis Patient MWF, did have dialysis yesterday 9/16.  Fistula graft to right upper arm.  Musc/Skel:   Bilateral AKA.     R Recommendations: See Admitting Provider Note  Report given to:   Additional Notes:

## 2023-04-30 NOTE — ED Provider Notes (Signed)
[facility]PT [facility] Emergency Department Provider Note MRN:  [id number]  Arrival date & time: 04/30/23     Chief Complaint   Abdominal Pain   History of Present Illness   [name] is a 42 y.o. year-old female presents to the ED with diabetes, hyperlipidemia, ESRD with dialysis Monday Wednesday Friday, right eye blindness, below the knee amputee bilaterally, hypertension, anxiety, presenting with CC of abdominal pain.  Onset today.  Denies any associated symptoms, specifically, fever, chills, nausea, vomiting, diarrhea, dysuria.  Has tried Tylenol without relief.  History provided by patient.   Review of Systems  Pertinent positive and negative review of systems noted in HPI.    Physical Exam   Vitals:   04/30/23 0257  BP: 138/73  Pulse: 98  Resp: 20  Temp: 100.1 F (37.8 C)    CONSTITUTIONAL:  chronically ill-appearing, NAD NEURO:  Alert and oriented x 3, CN 3-12 grossly intact EYES:  eyes equal and reactive ENT/NECK:  Supple, no stridor  CARDIO:  normal rate, regular rhythm, appears well-perfused  PULM:  No respiratory distress, CTAB GI/GU:  non-distended, diffuse tenderness, but unable to complete abdominal exam due to patient compliance MSK/SPINE:  No gross deformities, no edema, moves all extremities  SKIN:  no rash, atraumatic   *Additional and/or pertinent findings included in MDM below  Diagnostic and Interventional Summary    EKG Interpretation Date/Time:    Ventricular Rate:    PR Interval:    QRS Duration:    QT Interval:    QTC Calculation:   R Axis:      Text Interpretation:         Labs Reviewed  COMPREHENSIVE METABOLIC PANEL - Abnormal; Notable for the following components:      Result Value   Chloride 94 (*)    Glucose, Bld 228 (*)    BUN 46 (*)    Creatinine, Ser 5.74 (*)    Albumin 2.5 (*)    GFR, Estimated 9 (*)    Anion gap 18 (*)    All other components within normal limits  CBC WITH DIFFERENTIAL/PLATELET  - Abnormal; Notable for the following components:   Hemoglobin 10.1 (*)    HCT 35.0 (*)    MCH 25.7 (*)    MCHC 28.9 (*)    RDW 19.0 (*)    Neutro Abs 7.9 (*)    All other components within normal limits  PROTIME-INR - Abnormal; Notable for the following components:   Prothrombin Time 16.5 (*)    INR 1.3 (*)    All other components within normal limits  CULTURE, BLOOD (ROUTINE X 2)  CULTURE, BLOOD (ROUTINE X 2)  SARS CORONAVIRUS 2 BY RT PCR  APTT  HCG, SERUM, QUALITATIVE  URINALYSIS, W/ REFLEX TO CULTURE (INFECTION SUSPECTED)  I-STAT CG4 LACTIC ACID, ED  I-STAT CG4 LACTIC ACID, ED    DG Chest Port 1 View  Final Result    CT ABDOMEN PELVIS W CONTRAST    (Results Pending)    Medications  ondansetron (ZOFRAN) injection 4 mg (4 mg Intravenous Given 04/30/23 0421)  HYDROmorphone (DILAUDID) injection 0.5 mg (0.5 mg Intravenous Given 04/30/23 0422)  HYDROmorphone (DILAUDID) injection 0.5 mg (0.5 mg Intravenous Given 04/30/23 3329)     Procedures  /  Critical Care Procedures  ED Course and Medical Decision Making  I have reviewed the triage vital signs, the nursing notes, and pertinent available records from the EMR.  Social Determinants Affecting Complexity of Care: Patient has no clinically  significant social determinants affecting this chief complaint..   ED Course:    Medical Decision Making Amount and/or Complexity of Data Reviewed Labs: ordered. Radiology: ordered. ECG/medicine tests: ordered.  Risk Prescription drug management.         Consultants:    Treatment and Plan: Patient signed out to oncoming team at shift change.  Plan: Follow-up on CT.         Final Clinical Impressions(s) / ED Diagnoses  No diagnosis found.  ED Discharge Orders     None         Discharge Instructions Discussed with and Provided to Patient:   Discharge Instructions   None      Roxy Horseman, PA-C 04/30/23 0630    Palumbo, April, MD 04/30/23  [phone]

## 2023-04-30 NOTE — Consult Note (Addendum)
Renal Service Consult Note [facility]  [name] 04/30/2023 Tracey Krabbe, MD Requesting Physician: Dr. [place]  Reason for Consult: ESRD pt w/ abd pain and probable SBO HPI: The patient is a 42 y.o. year-old w/ PMH as below who presented to ED w/ abdominal pain. Temp in ED was 100.5, HR 104, RR 20, BP 138/ 73, 2.5 L [place]. WBC 9K, Hb 10. K+ 4.8, creat 5.7. COVID neg. CXR atx vs infiltrate L base. CT showed SBO, gen surg consulted and pt admitted. We are asked to see for dialysis.   Pt seen in room. C/o abd pain, no SOB or leg swelling. Thirsty as well.   ROS - denies CP, no joint pain, no HA, no blurry vision, no rash, no diarrhea, no nausea/ vomiting, no dysuria, no difficulty voiding   Past Medical History  Past Medical History:  Diagnosis Date   Anemia    Anxiety    Blind right eye 2008   Diabetes mellitus without complication (HCC)    Embolic stroke (HCC)    ESRD (end stage renal disease) (HCC)    HD on M/W/F   ESRD on hemodialysis (HCC)    GBS (Guillain Barre syndrome) (HCC)    Past Surgical History  Past Surgical History:  Procedure Laterality Date   AMPUTATION Right 08/11/2021   Procedure: TRANSMETATARSAL AMPUTATION OF RIGHT FOOT;  Surgeon: Nada Libman, MD;  Location: [facility] OR;  Service: Vascular;  Laterality: Right;   AMPUTATION Right 09/17/2021   Procedure: AMPUTATION RIGHT BELOW KNEE;  Surgeon: Nada Libman, MD;  Location: [facility] OR;  Service: Vascular;  Laterality: Right;   AMPUTATION Right 10/11/2021   Procedure: RIGHT ABOVE KNEE AMPUTATION;  Surgeon: Nada Libman, MD;  Location: [facility] OR;  Service: Vascular;  Laterality: Right;   AMPUTATION Left 07/02/2022   Procedure: LEFT ABOVE KNEE AMPUTATION;  Surgeon: Chuck Hint, MD;  Location: [facility] OR;  Service: Vascular;  Laterality: Left;   AMPUTATION TOE Left    APPLICATION OF WOUND VAC  08/16/2021   Procedure: APPLICATION OF WOUND VAC;  Surgeon: Nada Libman, MD;  Location: [facility] OR;   Service: Vascular;;   AV FISTULA PLACEMENT     BUBBLE STUDY  10/02/2021   Procedure: BUBBLE STUDY;  Surgeon: Pricilla Riffle, MD;  Location: [facility] ENDOSCOPY;  Service: Cardiovascular;;   CESAREAN SECTION  2011   FISTULA SUPERFICIALIZATION Right 07/27/2019   Procedure: 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;  Surgeon: Chuck Hint, MD;  Location: [facility] OR;  Service: Vascular;  Laterality: Right;   INSERTION OF DIALYSIS CATHETER Left 07/27/2019   Procedure: INSERTION OF TUNNELED  DIALYSIS CATHETER;  Surgeon: Chuck Hint, MD;  Location: [facility] OR;  Service: Vascular;  Laterality: Left;   INSERTION OF DIALYSIS CATHETER Left 10/23/2021   Procedure: INSERTION OF TUNNELED PALINDROME PRECISION DIALYSIS CATHETER (23cm);  Surgeon: Cephus Shelling, MD;  Location: [facility] OR;  Service: Vascular;  Laterality: Left;   IR FLUORO GUIDE CV LINE LEFT  10/16/2021   IR FLUORO GUIDE CV LINE RIGHT  10/11/2021   IR GASTROSTOMY TUBE MOD SED  10/30/2021   IR GASTROSTOMY TUBE REMOVAL  05/08/2022   IR US GUIDE VASC ACCESS LEFT  10/16/2021   IR US GUIDE VASC ACCESS RIGHT  10/11/2021   LEFT HEART CATH AND CORONARY ANGIOGRAPHY N/A 07/26/2022   Procedure: LEFT HEART CATH AND CORONARY ANGIOGRAPHY;  Surgeon: [place], Peter M, MD;  Location: [facility] INVASIVE CV LAB;  Service: Cardiovascular;  Laterality: N/A;   LOWER EXTREMITY ANGIOGRAPHY N/A 08/11/2021   Procedure: LOWER EXTREMITY ANGIOGRAPHY;  Surgeon: Nada Libman, MD;  Location: [facility] INVASIVE CV LAB;  Service: Cardiovascular;  Laterality: N/A;   REVISON OF ARTERIOVENOUS FISTULA Right 10/23/2021   Procedure: REVISON OF ARTERIOVENOUS FISTULA  ARM AND PLICATION;  Surgeon: Cephus Shelling, MD;  Location: [facility] OR;  Service: Vascular;  Laterality: Right;   TEE WITHOUT CARDIOVERSION N/A 10/02/2021   Procedure: TRANSESOPHAGEAL ECHOCARDIOGRAM (TEE);  Surgeon: Pricilla Riffle, MD;  Location: [facility] ENDOSCOPY;  Service: Cardiovascular;  Laterality: N/A;   TRANSMETATARSAL  AMPUTATION Right 08/16/2021   Procedure: CLOSURE OF RIGHT TRANSMETATARSAL AMPUTATION;  Surgeon: Nada Libman, MD;  Location: [facility] OR;  Service: Vascular;  Laterality: Right;   Family History  Family History  Problem Relation Age of Onset   Diabetes Mother    Diabetes Father    Social History  reports that she has never smoked. She has never used smokeless tobacco. She reports that she does not drink alcohol and does not use drugs. Allergies  Allergies  Allergen Reactions   Morphine Rash and Other (See Comments)   Peanut-Containing Drug Products Anaphylaxis and Hives   Penicillins Rash and Other (See Comments)    Rash in 2008.  Tolerated cefazolin in 2020. No reaction listed on MAR.   Chocolate Hives   Shellfish Allergy Swelling   Home medications Prior to Admission medications   Medication Sig Start Date End Date Taking? Authorizing Provider  acetaminophen (TYLENOL) 325 MG tablet Take 650 mg by mouth every 6 (six) hours as needed for fever or mild pain.   Yes [provider]  aspirin EC 81 MG tablet Take 1 tablet (81 mg total) by mouth daily. Swallow whole. 07/27/22  Yes Vann, Jessica U, DO  atorvastatin (LIPITOR) 40 MG tablet Place 1 tablet (40 mg total) into feeding tube daily. Patient taking differently: Take 40 mg by mouth daily. 10/26/21  Yes Burnadette Pop, MD  azelastine (OPTIVAR) 0.05 % ophthalmic solution Place 1 drop into both eyes 2 (two) times daily as needed (allergies). 11/13/22  Yes [provider]  cetirizine (ZYRTEC) 10 MG tablet Take 10 mg by mouth daily.   Yes [provider]  cinacalcet (SENSIPAR) 90 MG tablet Take 90 mg by mouth daily. 09/22/19  Yes [provider]  docusate sodium (COLACE) 100 MG capsule Take 100 mg by mouth at bedtime.   Yes [provider]  dorzolamide-timolol (COSOPT) 22.3-6.8 MG/ML ophthalmic solution Place 1 drop into the left eye 2 (two) times daily. 03/30/21  Yes [provider]  insulin  aspart (NOVOLOG) 100 UNIT/ML injection Inject 0-6 Units into the skin every 4 (four) hours. Patient taking differently: Inject 0-10 Units into the skin 3 (three) times daily with meals. 121-150=1 unit 152-200=2 units 201-250=3 units 251-300=5 units 301-350=7 units 351-400=10 units 10/25/21  Yes Adhikari, Amrit, MD  insulin glargine (SEMGLEE) 100 UNIT/ML injection Inject 0.1 mLs (10 Units total) into the skin daily at 6 PM. Patient taking differently: Inject 12 Units into the skin 2 (two) times daily. 12/28/22  Yes Dahal, Melina Schools, MD  isosorbide mononitrate (IMDUR) 30 MG 24 hr tablet Take 1 tablet (30 mg total) by mouth daily. 07/27/22  Yes Vann, Jessica U, DO  melatonin 3 MG TABS tablet Take 3 mg by mouth at bedtime.   Yes [provider]  metoprolol tartrate (LOPRESSOR) 25 MG tablet Take 0.5 tablets (12.5 mg total) by mouth 2 (two) times daily. 07/27/22  Yes  Marlin Canary U, DO  midodrine (PROAMATINE) 5 MG tablet Take 5 mg by mouth See admin instructions. Take 5mg  (1 tablet) by mouth on Monday, Wednesdays, and Fridays. In addition, take 5mg  (1 tablet) every 8 hours as needed for hypotension.   Yes [provider]  Multiple Vitamin (MULTIVITAMIN WITH MINERALS) TABS tablet Take 1 tablet by mouth daily.   Yes [provider]  Nutritional Supplements (NUTRITIONAL DRINK) LIQD Take 120 mLs by mouth daily.   Yes [provider]  ondansetron (ZOFRAN) 4 MG tablet Take 4 mg by mouth every 6 (six) hours as needed for nausea/vomiting. 03/02/22  Yes [provider]  oxyCODONE-acetaminophen (PERCOCET) 5-325 MG tablet Take 1 tablet by mouth every 8 (eight) hours as needed for severe pain. Patient taking differently: Take 1 tablet by mouth every 6 (six) hours as needed for severe pain. 07/27/22  Yes Vann, Jessica U, DO  polyethylene glycol (MIRALAX / GLYCOLAX) 17 g packet Take 17 g by mouth daily at 6 PM.   Yes [provider]  thiamine 100 MG tablet Place 1  tablet (100 mg total) into feeding tube daily. Patient taking differently: Take 100 mg by mouth daily. 10/26/21  Yes Burnadette Pop, MD  Accu-Chek FastClix Lancets MISC Use as instructed to check blood sugar up to TID. E11.22 Patient taking differently: 1 each by Other route See admin instructions. Use as instructed to check blood sugar up to TID. E11.22 04/15/19   Hoy Register, MD  Blood Glucose Monitoring Suppl (ACCU-CHEK GUIDE ME) w/Device KIT 1 kit by Does not apply route 3 (three) times daily. Use to check BG at home up to 3 times daily. E11.22 04/15/19   Hoy Register, MD  Continuous Blood Gluc Transmit (DEXCOM G6 TRANSMITTER) MISC 1 Device by Does not apply route as directed. 09/12/20   Shamleffer, Konrad Dolores, MD  glucose blood (ACCU-CHEK GUIDE) test strip Use as instructed to check blood sugar up to TID. E11.22 Patient taking differently: 1 each by Other route See admin instructions. Use as instructed to check blood sugar up to TID. E11.22 07/17/19   Fulp, Cammie, MD  oxyCODONE (OXY IR/ROXICODONE) 5 MG immediate release tablet Take 5 mg by mouth daily as needed for severe pain. Patient not taking: Reported on 04/30/2023    [provider]     Vitals:   04/30/23 0800 04/30/23 0900 04/30/23 0919 04/30/23 0936  BP:  (!) 143/64  136/80  Pulse:  (!) 104  (!) 110  Resp:  17  18  Temp: (!) 100.5 F (38.1 C)  100.1 F (37.8 C) 100.2 F (37.9 C)  TempSrc: Oral  Oral Oral  SpO2:  95%  97%   Exam Gen lethargic, responds easily, not in distress No rash, cyanosis or gangrene Sclera anicteric, throat clear  No jvd or bruits Chest clear bilat to bases, no rales/ wheezing RRR no RG Abd soft ntnd no mass or ascites +bs GU defer Ext bilat AKA, no edema, no wounds or ulcers Neuro is alert, Ox 3 , nf     R AVF +bruit       Home meds include - aspirin, lipitor, sensipar 90 every day, insulin glargine/ aspart, imdur 30, metoprolol 12.5 bid, midodrine 5mg  pre hd mwf, MVI, oxyIR,  percocet, vits/ supps/ prns     OP HD: [facility] MWF  3h  400/1.5  49kg   R AVF  Hep none - last OP HD 9/16, post wt 51kg, coming off 2- 4kg over the  last 3 wks - mircera IV q 2 wks, last 9/13, due 9/27 - hectorol 4 mcg IV three times per week   Assessment/ Plan: Abdominal pain - partial SBO per CT scan. CXR neg, LA up 2.5. Gen surg consulting.  Colitis - per CT as well. GI panel ordered and cipro/ flagyl.  DMT1 - per pmd ESRD - on HD MWF. Last HD Monday, has not missed. HD tomorrow.  Volume - no vol excess on exam.  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 Follow.  MBD ckd - CCa in range, add on phos. Cont IV vdra. Resume sensipar when eating.  PAD - bilat AKA      Vinson Moselle  MD [facility] 04/30/2023, 9:36 AM  Recent Labs  Lab 04/30/23 0447  HGB 10.1*  ALBUMIN 2.5*  CALCIUM 9.0  CREATININE 5.74*  K 4.8   Inpatient medications:  heparin  5,000 Units Subcutaneous Q8H   insulin aspart  0-9 Units Subcutaneous Q4H    acetaminophen **OR** acetaminophen, albuterol, bisacodyl, hydrALAZINE, HYDROmorphone (DILAUDID) injection, ondansetron **OR** ondansetron (ZOFRAN) IV

## 2023-04-30 NOTE — Plan of Care (Signed)
Patient alert/oriented X4. Patient compliant with medication administration and tolerated IV antibiotics/cont.fluids. Patient refused mobility/Q2 hours turns due to increased levels of abdominal pain. Patient was administered dilaudid IV as needed for pain. Pending stool sample, enteric precautions maintained. VSS. Patient remains NPO due to bowel obstruction.   Problem: Education: Goal: Knowledge of the prescribed therapeutic regimen will improve Outcome: Progressing   Problem: Education: Goal: Ability to verbalize activity precautions or restrictions will improve Outcome: Progressing   Problem: Education: Goal: Understanding of discharge needs will improve Outcome: Progressing   Problem: Activity: Goal: Ability to perform//tolerate increased activity and mobilize with assistive devices will improve Outcome: Progressing   Problem: Clinical Measurements: Goal: Postoperative complications will be avoided or minimized Outcome: Progressing   Problem: Self-Care: Goal: Ability to meet self-care needs will improve Outcome: Progressing   Problem: Self-Concept: Goal: Ability to maintain and perform role responsibilities to the fullest extent possible will improve Outcome: Progressing   Problem: Pain Management: Goal: Pain level will decrease with appropriate interventions Outcome: Progressing   Problem: Skin Integrity: Goal: Demonstration of wound healing without infection will improve Outcome: Progressing   Problem: Education: Goal: Understanding of cardiac disease, CV risk reduction, and recovery process will improve Outcome: Progressing   Problem: Education: Goal: Individualized Educational Video(s) Outcome: Progressing   Problem: Activity: Goal: Ability to tolerate increased activity will improve Outcome: Progressing   Problem: Cardiac: Goal: Ability to achieve and maintain adequate cardiovascular perfusion will improve Outcome: Progressing   Problem: Health  Behavior/Discharge Planning: Goal: Ability to safely manage health-related needs after discharge will improve Outcome: Progressing   Problem: Education: Goal: Understanding of CV disease, CV risk reduction, and recovery process will improve Outcome: Progressing   Problem: Education: Goal: Individualized Educational Video(s) Outcome: Progressing   Problem: Activity: Goal: Ability to return to baseline activity level will improve Outcome: Progressing   Problem: Cardiovascular: Goal: Ability to achieve and maintain adequate cardiovascular perfusion will improve Outcome: Progressing   Problem: Cardiovascular: Goal: Vascular access site(s) Level 0-1 will be maintained Outcome: Progressing   Problem: Health Behavior/Discharge Planning: Goal: Ability to safely manage health-related needs after discharge will improve Outcome: Progressing   Problem: Education: Goal: Ability to describe self-care measures that may prevent or decrease complications (Diabetes Survival Skills Education) will improve Outcome: Progressing   Problem: Education: Goal: Individualized Educational Video(s) Outcome: Progressing   Problem: Coping: Goal: Ability to adjust to condition or change in health will improve Outcome: Progressing   Problem: Fluid Volume: Goal: Ability to maintain a balanced intake and output will improve Outcome: Progressing   Problem: Health Behavior/Discharge Planning: Goal: Ability to identify and utilize available resources and services will improve Outcome: Progressing   Problem: Health Behavior/Discharge Planning: Goal: Ability to manage health-related needs will improve Outcome: Progressing   Problem: Metabolic: Goal: Ability to maintain appropriate glucose levels will improve Outcome: Progressing   Problem: Nutritional: Goal: Maintenance of adequate nutrition will improve Outcome: Progressing   Problem: Nutritional: Goal: Progress toward achieving an optimal  weight will improve Outcome: Progressing   Problem: Skin Integrity: Goal: Risk for impaired skin integrity will decrease Outcome: Progressing   Problem: Tissue Perfusion: Goal: Adequacy of tissue perfusion will improve Outcome: Progressing   Problem: Education: Goal: Knowledge of General Education information will improve Description: Including pain rating scale, medication(s)/side effects and non-pharmacologic comfort measures Outcome: Progressing   Problem: Health Behavior/Discharge Planning: Goal: Ability to manage health-related needs will improve Outcome: Progressing   Problem: Clinical Measurements: Goal: Ability to maintain clinical measurements within normal  limits will improve Outcome: Progressing   Problem: Clinical Measurements: Goal: Will remain free from infection Outcome: Progressing   Problem: Clinical Measurements: Goal: Diagnostic test results will improve Outcome: Progressing   Problem: Clinical Measurements: Goal: Respiratory complications will improve Outcome: Progressing   Problem: Clinical Measurements: Goal: Cardiovascular complication will be avoided Outcome: Progressing   Problem: Activity: Goal: Risk for activity intolerance will decrease Outcome: Progressing   Problem: Nutrition: Goal: Adequate nutrition will be maintained Outcome: Progressing   Problem: Coping: Goal: Level of anxiety will decrease Outcome: Progressing   Problem: Elimination: Goal: Will not experience complications related to bowel motility Outcome: Progressing   Problem: Elimination: Goal: Will not experience complications related to urinary retention Outcome: Progressing   Problem: Pain Managment: Goal: General experience of comfort will improve Outcome: Progressing

## 2023-04-30 NOTE — Consult Note (Addendum)
Rene Paci [birth year]  [id number].    Requesting MD: Riki Sheer, PA-C Chief Complaint/Reason for Consult: SBO  HPI:  [name] is a 42 y/o F with a PMH including diabetes with diabetic retinopathy, HLD,  ESRD on HD M/W/F, CAD, NSTEMI 07/2022, GBS and resultant dysphagia requiring IR gastrostomy tube in 2023 (removed 04/2022), mesenteric ischemia with colonic pneumatosis 12/2022 (treated non-operatively) who presents with acute onset abdominal pain. She tells me that she developed acute onset abdominal pain yesterday that is severe and constant. She is unable to tell me if she has had similar pain in the past. She reports associated diarrhea but also adds that loose stools are baseline for her. She does report flatus. She denies fever, chills, nausea, or vomiting. She denies eating any new/different foods. She denies tobacco or alcohol use. She last went to dialysis yesterday.   ROS: Review of Systems  All other systems reviewed and are negative.   Family History  Problem Relation Age of Onset   Diabetes Mother    Diabetes Father     Past Medical History:  Diagnosis Date   Anemia    Anxiety    Blind right eye 2008   Diabetes mellitus without complication (HCC)    Embolic stroke (HCC)    ESRD (end stage renal disease) (HCC)    HD on M/W/F   ESRD on hemodialysis (HCC)    GBS (Guillain Barre syndrome) (HCC)     Past Surgical History:  Procedure Laterality Date   AMPUTATION Right 08/11/2021   Procedure: TRANSMETATARSAL AMPUTATION OF RIGHT FOOT;  Surgeon: Nada Libman, MD;  Location: [facility] OR;  Service: Vascular;  Laterality: Right;   AMPUTATION Right 09/17/2021   Procedure: AMPUTATION RIGHT BELOW KNEE;  Surgeon: Nada Libman, MD;  Location: [facility] OR;  Service: Vascular;  Laterality: Right;   AMPUTATION Right 10/11/2021   Procedure: RIGHT ABOVE KNEE AMPUTATION;  Surgeon: Nada Libman, MD;  Location: [facility] OR;  Service: Vascular;  Laterality: Right;   AMPUTATION Left  07/02/2022   Procedure: LEFT ABOVE KNEE AMPUTATION;  Surgeon: Chuck Hint, MD;  Location: [facility] OR;  Service: Vascular;  Laterality: Left;   AMPUTATION TOE Left    APPLICATION OF WOUND VAC  08/16/2021   Procedure: APPLICATION OF WOUND VAC;  Surgeon: Nada Libman, MD;  Location: [facility] OR;  Service: Vascular;;   AV FISTULA PLACEMENT     BUBBLE STUDY  10/02/2021   Procedure: BUBBLE STUDY;  Surgeon: Pricilla Riffle, MD;  Location: [facility] ENDOSCOPY;  Service: Cardiovascular;;   CESAREAN SECTION  2011   FISTULA SUPERFICIALIZATION Right 07/27/2019   Procedure: 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;  Surgeon: Chuck Hint, MD;  Location: [facility] OR;  Service: Vascular;  Laterality: Right;   INSERTION OF DIALYSIS CATHETER Left 07/27/2019   Procedure: INSERTION OF TUNNELED  DIALYSIS CATHETER;  Surgeon: Chuck Hint, MD;  Location: [facility] OR;  Service: Vascular;  Laterality: Left;   INSERTION OF DIALYSIS CATHETER Left 10/23/2021   Procedure: INSERTION OF TUNNELED PALINDROME PRECISION DIALYSIS CATHETER (23cm);  Surgeon: Cephus Shelling, MD;  Location: [facility] OR;  Service: Vascular;  Laterality: Left;   IR FLUORO GUIDE CV LINE LEFT  10/16/2021   IR FLUORO GUIDE CV LINE RIGHT  10/11/2021   IR GASTROSTOMY TUBE MOD SED  10/30/2021   IR GASTROSTOMY TUBE REMOVAL  05/08/2022   IR US GUIDE VASC ACCESS LEFT  10/16/2021   IR US GUIDE VASC ACCESS RIGHT  10/11/2021   LEFT HEART CATH AND CORONARY ANGIOGRAPHY N/A 07/26/2022   Procedure: LEFT HEART CATH AND CORONARY ANGIOGRAPHY;  Surgeon: Swaziland, Peter M, MD;  Location: [facility] INVASIVE CV LAB;  Service: Cardiovascular;  Laterality: N/A;   LOWER EXTREMITY ANGIOGRAPHY N/A 08/11/2021   Procedure: LOWER EXTREMITY ANGIOGRAPHY;  Surgeon: Nada Libman, MD;  Location: [facility] INVASIVE CV LAB;  Service: Cardiovascular;  Laterality: N/A;   REVISON OF ARTERIOVENOUS FISTULA Right 10/23/2021   Procedure: REVISON OF ARTERIOVENOUS FISTULA  ARM AND PLICATION;  Surgeon:  Cephus Shelling, MD;  Location: [facility] OR;  Service: Vascular;  Laterality: Right;   TEE WITHOUT CARDIOVERSION N/A 10/02/2021   Procedure: TRANSESOPHAGEAL ECHOCARDIOGRAM (TEE);  Surgeon: Pricilla Riffle, MD;  Location: [facility] ENDOSCOPY;  Service: Cardiovascular;  Laterality: N/A;   TRANSMETATARSAL AMPUTATION Right 08/16/2021   Procedure: CLOSURE OF RIGHT TRANSMETATARSAL AMPUTATION;  Surgeon: Nada Libman, MD;  Location: [facility] OR;  Service: Vascular;  Laterality: Right;    Social History:  reports that she has never smoked. She has never used smokeless tobacco. She reports that she does not drink alcohol and does not use drugs.  Allergies:  Allergies  Allergen Reactions   Morphine Rash and Other (See Comments)   Peanut-Containing Drug Products Anaphylaxis and Hives   Penicillins Rash and Other (See Comments)    Rash in 2008.  Tolerated cefazolin in 2020. No reaction listed on MAR.   Chocolate Hives   Shellfish Allergy Swelling    Medications Prior to Admission  Medication Sig Dispense Refill   acetaminophen (TYLENOL) 325 MG tablet Take 650 mg by mouth every 6 (six) hours as needed for fever or mild pain.     aspirin EC 81 MG tablet Take 1 tablet (81 mg total) by mouth daily. Swallow whole. 30 tablet 12   atorvastatin (LIPITOR) 40 MG tablet Place 1 tablet (40 mg total) into feeding tube daily. (Patient taking differently: Take 40 mg by mouth daily.)     azelastine (OPTIVAR) 0.05 % ophthalmic solution Place 1 drop into both eyes 2 (two) times daily as needed (allergies).     cetirizine (ZYRTEC) 10 MG tablet Take 10 mg by mouth daily.     cinacalcet (SENSIPAR) 90 MG tablet Take 90 mg by mouth daily.     docusate sodium (COLACE) 100 MG capsule Take 100 mg by mouth at bedtime.     dorzolamide-timolol (COSOPT) 22.3-6.8 MG/ML ophthalmic solution Place 1 drop into the left eye 2 (two) times daily.     insulin aspart (NOVOLOG) 100 UNIT/ML injection Inject 0-6 Units into the skin every 4 (four) hours.  (Patient taking differently: Inject 0-10 Units into the skin 3 (three) times daily with meals. 121-150=1 unit 152-200=2 units 201-250=3 units 251-300=5 units 301-350=7 units 351-400=10 units) 10 mL 11   insulin glargine (SEMGLEE) 100 UNIT/ML injection Inject 0.1 mLs (10 Units total) into the skin daily at 6 PM. (Patient taking differently: Inject 12 Units into the skin 2 (two) times daily.) 10 mL 11   isosorbide mononitrate (IMDUR) 30 MG 24 hr tablet Take 1 tablet (30 mg total) by mouth daily.     melatonin 3 MG TABS tablet Take 3 mg by mouth at bedtime.     metoprolol tartrate (LOPRESSOR) 25 MG tablet Take 0.5 tablets (12.5 mg total) by mouth 2 (two) times daily.     midodrine (PROAMATINE) 5 MG tablet Take 5 mg by mouth See admin instructions. Take 5mg  (1 tablet) by mouth on Monday, Wednesdays, and Fridays. In  addition, take 5mg  (1 tablet) every 8 hours as needed for hypotension.     Multiple Vitamin (MULTIVITAMIN WITH MINERALS) TABS tablet Take 1 tablet by mouth daily.     Nutritional Supplements (NUTRITIONAL DRINK) LIQD Take 120 mLs by mouth daily.     ondansetron (ZOFRAN) 4 MG tablet Take 4 mg by mouth every 6 (six) hours as needed for nausea/vomiting.     oxyCODONE-acetaminophen (PERCOCET) 5-325 MG tablet Take 1 tablet by mouth every 8 (eight) hours as needed for severe pain. (Patient taking differently: Take 1 tablet by mouth every 6 (six) hours as needed for severe pain.) 4 tablet 0   polyethylene glycol (MIRALAX / GLYCOLAX) 17 g packet Take 17 g by mouth daily at 6 PM.     thiamine 100 MG tablet Place 1 tablet (100 mg total) into feeding tube daily. (Patient taking differently: Take 100 mg by mouth daily.)     Accu-Chek FastClix Lancets MISC Use as instructed to check blood sugar up to TID. E11.22 (Patient taking differently: 1 each by Other route See admin instructions. Use as instructed to check blood sugar up to TID. E11.22) 102 each 11   Blood Glucose Monitoring Suppl (ACCU-CHEK GUIDE  ME) w/Device KIT 1 kit by Does not apply route 3 (three) times daily. Use to check BG at home up to 3 times daily. E11.22 1 kit 0   Continuous Blood Gluc Transmit (DEXCOM G6 TRANSMITTER) MISC 1 Device by Does not apply route as directed. 1 each 3   glucose blood (ACCU-CHEK GUIDE) test strip Use as instructed to check blood sugar up to TID. E11.22 (Patient taking differently: 1 each by Other route See admin instructions. Use as instructed to check blood sugar up to TID. E11.22) 100 each 12   oxyCODONE (OXY IR/ROXICODONE) 5 MG immediate release tablet Take 5 mg by mouth daily as needed for severe pain. (Patient not taking: Reported on 04/30/2023)       Physical Exam: Blood pressure 136/80, pulse (!) 110, temperature 100.2 F (37.9 C), temperature source Oral, resp. rate 18, SpO2 97%. General :chronically ill appearing female, appears uncomfortable, curled up on her side. HEENT: head -normocephalic, atraumatic Neck- Trachea is midline CV- RRR, normal S1/S2, no M/R/G, no lower extremity edema  Pulm- breathing is non-labored ORA Abd- soft, non-distended, TTP RLQ and LLQ without guarding or peritonitis, there is no upper abdominal tenderness. Previous gastrostomy tube site appears well healed GU- deferred  MSK- s/p amputations of BLE, move BUE spontaneously  Neuro- nonfocal exam, cranial nerves grossly in tact Psych- Alert and Oriented  Skin: warm and dry   Results for orders placed or performed during the hospital encounter of 04/30/23 (from the past 48 hour(s))  Blood Culture (routine x 2)     Status: None (Preliminary result)   Collection Time: 04/30/23  3:56 AM   Specimen: BLOOD LEFT ARM  Result Value Ref Range   Specimen Description BLOOD LEFT ARM    Special Requests      BOTTLES DRAWN AEROBIC AND ANAEROBIC Blood Culture results may not be optimal due to an excessive volume of blood received in culture bottles   Culture      NO GROWTH < 12 HOURS Performed at [facility] Lab,  [address], [place], [place] [address]    Report Status PENDING   Blood Culture (routine x 2)     Status: None (Preliminary result)   Collection Time: 04/30/23  3:56 AM   Specimen: BLOOD RIGHT ARM  Result Value Ref Range   Specimen Description BLOOD RIGHT ARM    Special Requests      BOTTLES DRAWN AEROBIC AND ANAEROBIC Blood Culture adequate volume   Culture      NO GROWTH < 12 HOURS Performed at [facility] Lab, [address], [place], [place] [address]    Report Status PENDING   Comprehensive metabolic panel     Status: Abnormal   Collection Time: 04/30/23  4:47 AM  Result Value Ref Range   Sodium 138 135 - 145 mmol/L   Potassium 4.8 3.5 - 5.1 mmol/L   Chloride 94 (L) 98 - 111 mmol/L   CO2 26 22 - 32 mmol/L   Glucose, Bld 228 (H) 70 - 99 mg/dL    Comment: Glucose reference range applies only to samples taken after fasting for at least 8 hours.   BUN 46 (H) 6 - 20 mg/dL   Creatinine, Ser 6.44 (H) 0.44 - 1.00 mg/dL   Calcium 9.0 8.9 - 03.4 mg/dL   Total Protein 6.9 6.5 - 8.1 g/dL   Albumin 2.5 (L) 3.5 - 5.0 g/dL   AST 21 15 - 41 U/L   ALT 16 0 - 44 U/L   Alkaline Phosphatase 92 38 - 126 U/L   Total Bilirubin 0.7 0.3 - 1.2 mg/dL   GFR, Estimated 9 (L) >60 mL/min    Comment: (NOTE) Calculated using the CKD-EPI Creatinine Equation (2021)    Anion gap 18 (H) 5 - 15    Comment: Performed at [facility] Lab, [address], [place], [place] [address]  CBC with Differential     Status: Abnormal   Collection Time: 04/30/23  4:47 AM  Result Value Ref Range   WBC 9.7 4.0 - 10.5 K/uL   RBC 3.93 3.87 - 5.11 MIL/uL   Hemoglobin 10.1 (L) 12.0 - 15.0 g/dL   HCT 56.3 (L) 87.5 - 64.3 %   MCV 89.1 80.0 - 100.0 fL   MCH 25.7 (L) 26.0 - 34.0 pg   MCHC 28.9 (L) 30.0 - 36.0 g/dL   RDW 32.9 (H) 51.8 - 84.1 %   Platelets 199 150 - 400 K/uL   nRBC 0.2 0.0 - 0.2 %   Neutrophils Relative % 81 %   Neutro Abs 7.9 (H) 1.7 - 7.7 K/uL   Lymphocytes Relative 11 %   Lymphs Abs 1.1 0.7 -  4.0 K/uL   Monocytes Relative 7 %   Monocytes Absolute 0.7 0.1 - 1.0 K/uL   Eosinophils Relative 1 %   Eosinophils Absolute 0.1 0.0 - 0.5 K/uL   Basophils Relative 0 %   Basophils Absolute 0.0 0.0 - 0.1 K/uL   Immature Granulocytes 0 %   Abs Immature Granulocytes 0.03 0.00 - 0.07 K/uL    Comment: Performed at [facility] Lab, [address] [place], [place] [address]  Protime-INR     Status: Abnormal   Collection Time: 04/30/23  4:47 AM  Result Value Ref Range   Prothrombin Time 16.5 (H) 11.4 - 15.2 seconds   INR 1.3 (H) 0.8 - 1.2    Comment: (NOTE) INR goal varies based on device and disease states. Performed at [facility], [address] [place], [place] [address]   APTT     Status: None   Collection Time: 04/30/23  4:47 AM  Result Value Ref Range   aPTT 32 24 - 36 seconds    Comment: Performed at [facility], [address] [place], [place] [address]  hCG, serum, qualitative     Status: None   Collection Time: 04/30/23  4:47 AM  Result Value Ref Range   Preg, Serum NEGATIVE NEGATIVE    Comment:        THE SENSITIVITY OF THIS METHODOLOGY IS >10 mIU/mL. Performed at [facility], [address], [place], [place] [address]   I-Stat Lactic Acid, ED     Status: None   Collection Time: 04/30/23  5:06 AM  Result Value Ref Range   Lactic Acid, Venous 1.9 0.5 - 1.9 mmol/L  SARS Coronavirus 2 by RT PCR (hospital order, performed in [facility] hospital lab) *cepheid single result test* Anterior Nasal Swab     Status: None   Collection Time: 04/30/23  6:50 AM   Specimen: Anterior Nasal Swab  Result Value Ref Range   SARS Coronavirus 2 by RT PCR NEGATIVE NEGATIVE    Comment: Performed at [facility], [address], [place], [place] [address]  I-Stat Lactic Acid, ED     Status: Abnormal   Collection Time: 04/30/23  7:28 AM  Result Value Ref Range   Lactic Acid, Venous 2.5 (HH) 0.5 - 1.9 mmol/L   Comment NOTIFIED PHYSICIAN   CBG monitoring, ED      Status: Abnormal   Collection Time: 04/30/23  9:05 AM  Result Value Ref Range   Glucose-Capillary 192 (H) 70 - 99 mg/dL    Comment: Glucose reference range applies only to samples taken after fasting for at least 8 hours.   CT ABDOMEN PELVIS W CONTRAST  Result Date: 04/30/2023 CLINICAL DATA:  Generalized abdominal pain. EXAM: CT ABDOMEN AND PELVIS WITH CONTRAST TECHNIQUE: Multidetector CT imaging of the abdomen and pelvis was performed using the standard protocol following bolus administration of intravenous contrast. RADIATION DOSE REDUCTION: This exam was performed according to the departmental dose-optimization program which includes automated exposure control, adjustment of the mA and/or kV according to patient size and/or use of iterative reconstruction technique. CONTRAST:  75mL OMNIPAQUE IOHEXOL 350 MG/ML SOLN COMPARISON:  CTA abdomen and pelvis 12/22/2022, CT abdomen and pelvis no contrast 02/09/2022. FINDINGS: Lower chest: There is asymmetric with subpleural atelectasis in the left lower lobe possibly positional, with patient left posterior oblique. Lung bases are clear of infiltrates. The cardiac size is normal. There is three-vessel coronary artery calcification, small hiatal hernia. No pericardial effusion. Hepatobiliary: There is no hepatic mass enhancement. There are stones in the gallbladder without wall thickening. The common bile duct is prominent but no more than previously, measuring 11 mm without visible filling defect. There is only slight central intrahepatic biliary prominence. Pancreas: The gland is partially atrophic with mild prominence in the duct, but this was seen previously. There is no mass enhancement. Spleen: There is a linear hypodensity in the medial aspect of the spleen which was seen previously, preferably reflecting a chronic infarct. There is no mass enhancement. Adrenals/Urinary Tract: Both kidneys show moderate atrophy. 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 There are extensive renovascular calcifications. No collecting system stones or hydronephrosis are seen. There is no adrenal or renal mass enhancement. The bladder is unremarkable for the degree of distention. Stomach/Bowel: There is increased diffuse thickening of the gastric wall greater distally. Consider endoscopic follow-up. The duodenum and jejunum are unremarkable but there is dilatation of ileal small bowel in the mid to lower abdomen up to 4 cm. The  exact transition to decompressed caliber and obstructive etiology are not identified but the most distal ileal segments are decompressed. Findings are compatible with small bowel obstruction. Evaluation of the colon demonstrates an upper limit of normal caliber retrocecal appendix without inflammatory change. This is unchanged. There is increased diffuse fold thickening in the ascending and transverse colon and increasingly also in the most distal descending and sigmoid segments consistent with colitis whether inflammatory, infectious or ischemic. There is no wall pneumatosis portal venous gas. Vascular/Lymphatic: There is extensive aortoiliac and visceral branch vessel calcific atherosclerosis, including the small visceral branch arteries to the mesentery, both kidneys, spleen and liver, especially unusual considering young age. There is no AAA. No enlarged lymph nodes are seen. Reproductive: There are extensive vascular calcifications in the uterus. The uterus and adnexal spaces are otherwise unremarkable. Other: No abdominal wall hernia or abnormality. Minimal pelvic ascites. No drainable pocket. No free air or free hemorrhage. No localizing collections. Musculoskeletal: No acute or significant osseous findings. IMPRESSION: 1. Evidence of small-bowel obstruction with dilated ileal segments up to 4 cm. The exact transition to decompressed caliber in the obstructive etiology not identified but the most  distal ileal segments are decompressed. 2. Increased diffuse fold thickening in the ascending and transverse colon and in the most distal descending and sigmoid segments consistent with colitis whether inflammatory, infectious or ischemic. No pneumatosis or portal venous gas. 3. Increased diffuse gastric wall thickening greater distally. Consider endoscopic follow-up. 4. Cholelithiasis with mild chronic prominence of the common bile duct but no visible filling defect. 5. Extensive vascular calcifications including the small visceral branch arteries to the mesentery, both kidneys, spleen and liver, markedly age-advanced. 6. Chronic linear hypodensity in the medial aspect of the spleen, probably a chronic infarct. 7. Small hiatal hernia. 8. 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 9. Minimal pelvic ascites. 10. Aortic and coronary artery atherosclerosis. Aortic Atherosclerosis (ICD10-I70.0). Electronically Signed   By: Almira Bar M.D.   On: 04/30/2023 07:11   DG Chest Port 1 View  Result Date: 04/30/2023 CLINICAL DATA:  Body aches and questionable sepsis EXAM: PORTABLE CHEST 1 VIEW COMPARISON:  12/22/2022 FINDINGS: Low volume chest with streaky density at the left base. There is no edema, air bronchogram, effusion, or pneumothorax. Stable heart size and mediastinal contours accounting for leftward rotation. IMPRESSION: Atelectasis or infiltrate at the left lung base.  Low lung volumes. Electronically Signed   By: Tiburcio Pea M.D.   On: 04/30/2023 04:49      Assessment/Plan Possible partial SBO -- having flatus and diarrhea. On CT scan she has some air-fluid levels of her small bowel without a definite transition zone. She denies nausea or vomiting, hold off on NG tube. She has no peritonitis and her exam. Will consider PO SBO protocol if her lactate has cleared with supportive care/IVF.   Gastritis? Gastric wall appears  thickened, more severe near the pylorus. IV PPI ordered. Avoid NSAIDs. She denies tobacco/etoh use.   Possible ascending/transverse colitis, sigmoid colitis - low grade fever, lower abdominal tenderness, diarrhea. Ischemic vs infectious colitis suspected. Start IV abx. No pneumatosis on CT scan. Lactate 2.5.   No emergent surgical needs at this time, we will follow closely how she responds to supportive care, bowel rest, antibiotics. If there is increasing concern for mesenteric ischemia then she may need CTA of the abdomen.   FEN - NPO VTE - SCD's, SQH ID - Cipro/Flagyl  Admit -  TRH service   CAD/NSTEMI  PAD DM1 w/ retinopathy  ESRD on HD History of GBS and dysphagia 2023, now recovered  Right eye blindeness   I reviewed nursing notes, Consultant nephrology  notes, hospitalist notes, last 24 h vitals and pain scores, last 48 h intake and output, last 24 h labs and trends, and last 24 h imaging results.  Adam Phenix, [facility] 04/30/2023, 10:19 AM Please see Amion for pager number during day hours 7:00am-4:30pm or 7:00am -11:30am on weekends

## 2023-04-30 NOTE — ED Notes (Signed)
Pt refused to allow me to assess her abdomen and pt refused to allow me to place NG tube

## 2023-04-30 NOTE — ED Notes (Signed)
Pt refused to change into a gown.

## 2023-04-30 NOTE — ED Triage Notes (Signed)
Pt BIB EMS from [facility]. C/o abdominal pain thats been going on all day. Also c/o pain in buttocks. Unrelieved by tylenol.  EMS VS: 130/80, HR 102, 98% RA

## 2023-04-30 NOTE — ED Provider Notes (Signed)
Accepted handoff at shift change from [employer], [place]. Please see prior provider note for more detail.   Briefly: Patient is 42 y.o. with diabetes, ESRD and hyperlipidemia presenting for abdominal pain.  DDX: concern for intra-abdominal infection, bowel obstruction, electrolyte derangement, other  Plan: Follow-up on CT scan.  Also questionable infiltrate on chest x-ray.  Dispo depending on results of the scan.   Physical Exam  BP 138/73 (BP Location: Left Arm)   Pulse 98   Temp (!) 100.5 F (38.1 C) (Oral)   Resp 20   Physical Exam  Procedures  Procedures  ED Course / MDM   Clinical Course as of 04/30/23 0804  Tue Apr 30, 2023  0631 Abdominal pain. Awaiting CT. Dialysis patient. Questionable infiltrate on cxr with elevated temp. If CT negative, may still consider admission for pneumonia.  [JR]  1610 Per surgery Zeb Comfort), NG tube. admit to medicine [JR]    Clinical Course User Index [JR] Gareth Eagle, PA-C   Medical Decision Making Amount and/or Complexity of Data Reviewed Labs: ordered. Radiology: ordered. ECG/medicine tests: ordered.  Risk Prescription drug management. Decision regarding hospitalization.   CT revealed suggestions of possible small bowel obstruction.  No evidence of pneumonia.  Discussed patient with general surgery, Zeb Comfort, advised NG tube and hospital admission, and surgery would also evaluate patient.  Admitted to hospital service with Dr. Eric [place].  On serial reassessments, patient was resting comfortably asleep.  Patient did mention that she went to her dialysis yesterday as scheduled.       Gareth Eagle, PA-C 04/30/23 [id number]    Lonell Grandchild, MD 04/30/23 [phone]

## 2023-05-01 ENCOUNTER — Inpatient Hospital Stay (HOSPITAL_COMMUNITY): Payer: Medicare Other

## 2023-05-01 DIAGNOSIS — K56609 Unspecified intestinal obstruction, unspecified as to partial versus complete obstruction: Secondary | ICD-10-CM | POA: Diagnosis not present

## 2023-05-01 LAB — GLUCOSE, CAPILLARY
Glucose-Capillary: 100 mg/dL — ABNORMAL HIGH (ref 70–99)
Glucose-Capillary: 73 mg/dL (ref 70–99)
Glucose-Capillary: 74 mg/dL (ref 70–99)
Glucose-Capillary: 77 mg/dL (ref 70–99)
Glucose-Capillary: 79 mg/dL (ref 70–99)
Glucose-Capillary: 90 mg/dL (ref 70–99)

## 2023-05-01 LAB — HEPATITIS B SURFACE ANTIGEN: Hepatitis B Surface Ag: NONREACTIVE

## 2023-05-01 MED ORDER — SODIUM CHLORIDE 0.9 % IV BOLUS
250.0000 mL | Freq: Once | INTRAVENOUS | Status: AC
  Administered 2023-05-01: 250 mL via INTRAVENOUS

## 2023-05-01 MED ORDER — MIDODRINE HCL 5 MG PO TABS
10.0000 mg | ORAL_TABLET | Freq: Once | ORAL | Status: AC
  Administered 2023-05-01: 10 mg via ORAL
  Filled 2023-05-01: qty 2

## 2023-05-01 MED ORDER — NEPRO/CARBSTEADY PO LIQD: 237.0000 mL | ORAL | Status: DC | PRN

## 2023-05-01 MED ORDER — LIDOCAINE-PRILOCAINE 2.5-2.5 % EX CREA: 1.0000 | TOPICAL_CREAM | CUTANEOUS | Status: DC | PRN

## 2023-05-01 MED ORDER — HEPARIN SODIUM (PORCINE) 1000 UNIT/ML DIALYSIS
1000.0000 [IU] | INTRAMUSCULAR | Status: DC | PRN
  Filled 2023-05-01: qty 1

## 2023-05-01 MED ORDER — ANTICOAGULANT SODIUM CITRATE 4% (200MG/5ML) IV SOLN
5.0000 mL | Status: DC | PRN
  Filled 2023-05-01: qty 5

## 2023-05-01 MED ORDER — DEXTROSE 5 % IV SOLN: INTRAVENOUS | Status: DC

## 2023-05-01 MED ORDER — IOHEXOL 350 MG/ML SOLN
75.0000 mL | Freq: Once | INTRAVENOUS | Status: AC | PRN
  Administered 2023-05-01: 75 mL via INTRAVENOUS

## 2023-05-01 MED ORDER — LIDOCAINE HCL (PF) 1 % IJ SOLN
5.0000 mL | INTRAMUSCULAR | Status: DC | PRN
  Administered 2023-05-10: 5 mL via INTRADERMAL
  Filled 2023-05-01: qty 5

## 2023-05-01 MED ORDER — ALTEPLASE 2 MG IJ SOLR: 2.0000 mg | Freq: Once | INTRAMUSCULAR | Status: DC | PRN

## 2023-05-01 MED ORDER — PENTAFLUOROPROP-TETRAFLUOROETH EX AERO: 1.0000 | INHALATION_SPRAY | CUTANEOUS | Status: DC | PRN

## 2023-05-01 MED ORDER — INSULIN GLARGINE-YFGN 100 UNIT/ML ~~LOC~~ SOLN
5.0000 [IU] | Freq: Every day | SUBCUTANEOUS | Status: DC
  Filled 2023-05-01 (×2): qty 0.05

## 2023-05-01 MED ORDER — HYDROMORPHONE HCL 1 MG/ML IJ SOLN
0.5000 mg | INTRAMUSCULAR | Status: DC | PRN
  Administered 2023-05-01 (×3): 1 mg via INTRAVENOUS
  Administered 2023-05-01 (×2): 0.5 mg via INTRAVENOUS
  Administered 2023-05-02 – 2023-05-06 (×10): 1 mg via INTRAVENOUS
  Administered 2023-05-06: 0.5 mg via INTRAVENOUS
  Administered 2023-05-06 (×2): 1 mg via INTRAVENOUS
  Administered 2023-05-06: 0.5 mg via INTRAVENOUS
  Administered 2023-05-07 – 2023-05-08 (×3): 1 mg via INTRAVENOUS
  Administered 2023-05-08: 0.5 mg via INTRAVENOUS
  Administered 2023-05-08: 1 mg via INTRAVENOUS
  Administered 2023-05-09: 0.5 mg via INTRAVENOUS
  Administered 2023-05-09 – 2023-05-11 (×3): 1 mg via INTRAVENOUS
  Filled 2023-05-01 (×6): qty 1
  Filled 2023-05-01 (×2): qty 0.5
  Filled 2023-05-01 (×3): qty 1
  Filled 2023-05-01 (×2): qty 0.5
  Filled 2023-05-01 (×3): qty 1
  Filled 2023-05-01: qty 0.5
  Filled 2023-05-01 (×4): qty 1
  Filled 2023-05-01: qty 0.5
  Filled 2023-05-01 (×2): qty 1
  Filled 2023-05-01: qty 0.5
  Filled 2023-05-01 (×4): qty 1

## 2023-05-01 MED ORDER — HYDROCODONE-ACETAMINOPHEN 5-325 MG PO TABS
1.0000 | ORAL_TABLET | Freq: Four times a day (QID) | ORAL | Status: DC | PRN
  Administered 2023-05-01 – 2023-05-24 (×40): 1 via ORAL
  Filled 2023-05-01 (×44): qty 1

## 2023-05-01 NOTE — Plan of Care (Signed)
Patient alert/oriented X4. Patient compliant with medication administration and new 20 gage IV placed in L forearm. Patient went down to CT and is then being transported to dialysis. Patient complains of consistent pain in abdominal region, hydrocodone administered for pain. VSS.  Problem: Education: Goal: Knowledge of the prescribed therapeutic regimen will improve Outcome: Progressing   Problem: Education: Goal: Ability to verbalize activity precautions or restrictions will improve Outcome: Progressing   Problem: Education: Goal: Understanding of discharge needs will improve Outcome: Progressing   Problem: Activity: Goal: Ability to perform//tolerate increased activity and mobilize with assistive devices will improve Outcome: Progressing   Problem: Clinical Measurements: Goal: Postoperative complications will be avoided or minimized Outcome: Progressing   Problem: Self-Care: Goal: Ability to meet self-care needs will improve Outcome: Progressing   Problem: Self-Concept: Goal: Ability to maintain and perform role responsibilities to the fullest extent possible will improve Outcome: Progressing   Problem: Pain Management: Goal: Pain level will decrease with appropriate interventions Outcome: Progressing   Problem: Skin Integrity: Goal: Demonstration of wound healing without infection will improve Outcome: Progressing   Problem: Education: Goal: Understanding of cardiac disease, CV risk reduction, and recovery process will improve Outcome: Progressing   Problem: Education: Goal: Individualized Educational Video(s) Outcome: Progressing   Problem: Activity: Goal: Ability to tolerate increased activity will improve Outcome: Progressing   Problem: Cardiac: Goal: Ability to achieve and maintain adequate cardiovascular perfusion will improve Outcome: Progressing   Problem: Health Behavior/Discharge Planning: Goal: Ability to safely manage health-related needs after  discharge will improve Outcome: Progressing   Problem: Education: Goal: Understanding of CV disease, CV risk reduction, and recovery process will improve Outcome: Progressing   Problem: Education: Goal: Individualized Educational Video(s) Outcome: Progressing   Problem: Activity: Goal: Ability to return to baseline activity level will improve Outcome: Progressing   Problem: Cardiovascular: Goal: Ability to achieve and maintain adequate cardiovascular perfusion will improve Outcome: Progressing   Problem: Cardiovascular: Goal: Vascular access site(s) Level 0-1 will be maintained Outcome: Progressing   Problem: Health Behavior/Discharge Planning: Goal: Ability to safely manage health-related needs after discharge will improve Outcome: Progressing   Problem: Education: Goal: Ability to describe self-care measures that may prevent or decrease complications (Diabetes Survival Skills Education) will improve Outcome: Progressing   Problem: Education: Goal: Individualized Educational Video(s) Outcome: Progressing   Problem: Coping: Goal: Ability to adjust to condition or change in health will improve Outcome: Progressing   Problem: Fluid Volume: Goal: Ability to maintain a balanced intake and output will improve Outcome: Progressing   Problem: Health Behavior/Discharge Planning: Goal: Ability to identify and utilize available resources and services will improve Outcome: Progressing   Problem: Health Behavior/Discharge Planning: Goal: Ability to manage health-related needs will improve Outcome: Progressing   Problem: Metabolic: Goal: Ability to maintain appropriate glucose levels will improve Outcome: Progressing   Problem: Nutritional: Goal: Maintenance of adequate nutrition will improve Outcome: Progressing   Problem: Nutritional: Goal: Progress toward achieving an optimal weight will improve Outcome: Progressing   Problem: Skin Integrity: Goal: Risk for  impaired skin integrity will decrease Outcome: Progressing   Problem: Education: Goal: Knowledge of General Education information will improve Description: Including pain rating scale, medication(s)/side effects and non-pharmacologic comfort measures Outcome: Progressing   Problem: Health Behavior/Discharge Planning: Goal: Ability to manage health-related needs will improve Outcome: Progressing   Problem: Clinical Measurements: Goal: Ability to maintain clinical measurements within normal limits will improve Outcome: Progressing   Problem: Clinical Measurements: Goal: Will remain free from infection Outcome: Progressing  Problem: Clinical Measurements: Goal: Diagnostic test results will improve Outcome: Progressing   Problem: Clinical Measurements: Goal: Respiratory complications will improve Outcome: Progressing   Problem: Clinical Measurements: Goal: Cardiovascular complication will be avoided Outcome: Progressing   Problem: Activity: Goal: Risk for activity intolerance will decrease Outcome: Progressing   Problem: Nutrition: Goal: Adequate nutrition will be maintained Outcome: Progressing   Problem: Coping: Goal: Level of anxiety will decrease Outcome: Progressing   Problem: Elimination: Goal: Will not experience complications related to bowel motility Outcome: Progressing   Problem: Pain Managment: Goal: General experience of comfort will improve Outcome: Progressing   Problem: Elimination: Goal: Will not experience complications related to urinary retention Outcome: Progressing

## 2023-05-01 NOTE — Progress Notes (Signed)
  [facility] Progress Note   Subjective:  Seen in room. Lying in bed moaning, asking for pain meds. Belly hurting. Notified care team  Objective Vitals:   04/30/23 1959 05/01/23 0338 05/01/23 0536 05/01/23 0811  BP: (!) 87/61 100/61  114/71  Pulse: (!) 110 (!) 104  (!) 109  Resp: 18     Temp: 100 F (37.8 C) 99.1 F (37.3 C)  99.7 F (37.6 C)  TempSrc: Oral Oral  Oral  SpO2:    97%  Weight:   51.3 kg      Additional Objective Labs: Basic Metabolic Panel: Recent Labs  Lab 04/30/23 0447  NA 138  K 4.8  CL 94*  CO2 26  GLUCOSE 228*  BUN 46*  CREATININE 5.74*  CALCIUM 9.0   CBC: Recent Labs  Lab 04/30/23 0447  WBC 9.7  NEUTROABS 7.9*  HGB 10.1*  HCT 35.0*  MCV 89.1  PLT 199   Blood Culture    Component Value Date/Time   SDES BLOOD LEFT ARM 04/30/2023 0356   SDES BLOOD RIGHT ARM 04/30/2023 0356   SPECREQUEST  04/30/2023 0356    BOTTLES DRAWN AEROBIC AND ANAEROBIC Blood Culture results may not be optimal due to an excessive volume of blood received in culture bottles   SPECREQUEST  04/30/2023 0356    BOTTLES DRAWN AEROBIC AND ANAEROBIC Blood Culture adequate volume   CULT  04/30/2023 0356    NO GROWTH < 12 HOURS Performed at [facility], [address], [place], [place] [address]    CULT  04/30/2023 0356    NO GROWTH < 12 HOURS Performed at [facility], [address], [place], [place] [address]    REPTSTATUS PENDING 04/30/2023 0356   REPTSTATUS PENDING 04/30/2023 0356     Physical Exam General: Lying in bed, uncomfortable Heart: Regular rate Lungs: Bilateral breath sounds, normal wob Abdomen: soft , diffusely tender to palpation Extremities: b/l BKAs Dialysis Access: RUE AVF +bruit   Medications:  ciprofloxacin 400 mg (04/30/23 1832)   dextrose     metronidazole 500 mg (05/01/23 0343)    Chlorhexidine Gluconate Cloth  6 each Topical Q0600   heparin  5,000 Units Subcutaneous Q8H   insulin aspart  0-9 Units  Subcutaneous Q4H   insulin glargine-yfgn  5 Units Subcutaneous QHS   pantoprazole (PROTONIX) IV  40 mg Intravenous QHS    Dialysis Orders: [facility] MWF  3h  400/1.5  49kg   R AVF  Hep none - last OP HD 9/16, post wt 51kg, coming off 2- 4kg over the last 3 wks - mircera IV q 2 wks, last 9/13, due 9/27 - hectorol 4 mcg IV three times per week  Assessment/Plan: Abdominal pain - partial SBO per CT scan. CXR neg. Gen surg consulting.  Colitis - per CT as well. GI panel ordered and cipro/ flagyl.  DMT1 - per pmd ESRD - on HD MWF. HD today  Volume - no vol excess on exam.  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 Follow.  MBD ckd - CCa in range, add on phos. Cont IV vdra. Resume sensipar when eating.  PAD - bilat AKA    Tomasa Blase PA-C [facility] 05/01/2023,10:15 AM

## 2023-05-01 NOTE — Progress Notes (Signed)
Pt receives out-pt HD at [facility] on MWF. Will assist as needed.   Olivia Canter Renal Navigator [phone]

## 2023-05-01 NOTE — Progress Notes (Signed)
Received patient in bed to unit.  Alert and oriented.  Informed consent signed and in chart.   TX duration:3.5  Patient tolerated well.  Transported back to the room  Alert, without acute distress.  Hand-off given to patient's nurse.   Access used: right AVF Access issues:   Total UF removed: 0 Medication(s) given: dilaudid   05/01/23 1805  Vitals  Temp 97.9 F (36.6 C)  Temp Source Oral  BP 94/67  MAP (mmHg) 75  BP Location Left Arm  BP Method Automatic  Patient Position (if appropriate) Lying  Pulse Rate (!) 116  ECG Heart Rate (!) 118  Resp 20  Oxygen Therapy  SpO2 98 %  O2 Device Room Air  During Treatment Monitoring  HD Safety Checks Performed Yes  Intra-Hemodialysis Comments Tx completed  Dialysis Fluid Bolus Normal Saline  Bolus Amount (mL) 300 mL      Tenoch Mcclure S [facility]

## 2023-05-01 NOTE — Progress Notes (Signed)
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  Patient was found to be febrile, tachycardic, lactic acid 1.9 followed by 2.5, chest x-ray atelectasis versus infiltrate.  CT abdomen and pelvis with contrast shows evidence of a small bowel obstruction with dilated ileal segment up to 4 cm, sigmoid segment consistent with colitis, diffuse gastric wall thickening, cholelithiasis with mild chronic prominence of the common bile duct.  General surgery consulted and following concern for bowel ischemia.  Plan to continue n.p.o.    Assessment & Plan:   Principal Problem:   SBO (small bowel obstruction) (HCC)   1-Partial small bowel obstruction versus Ischemic colitis versus infectious colitis -Patient presented with abdominal pain, lactic acidosis, CT abdomen and pelvis consistent with SBO and colitis -Appreciate general surgery recommendation plan to continue with n.p.o. status -Continue IV ciprofloxacin and Flagyl -Check GI pathogen and CT -General Surgery repeating CT abdomen and pelvis angio  2-Diabetes type 1: CBG 74.  She is currently NPO.  Will start low rate D5.  Change Semglee  to daily Monitor CBG  3-peripheral vascular disease status post bilateral BKA -Continue statins while NPO  4-ESRD on hemodialysis Monday Wednesday and Friday, hyperkalemia Potassium at 6, she is currently receiving  hemodialysis  5-essential hypertension: To hold blood pressure medications  Hyperlipidemia: Continue to hold statins Chronic anemia anemia of chronic disease: Continue to monitor hemoglobin     Pressure Injury 02/10/22 Sacrum Left Stage 2 -  Partial thickness loss of dermis presenting as a shallow open injury with a red, pink wound bed without slough. (Active)  02/10/22 0500  Location: Sacrum  Location Orientation: Left  Staging: Stage 2 -  Partial thickness loss of dermis presenting as a shallow open injury with a red, pink wound bed without slough.  Wound Description (Comments):   Present on Admission: Yes     Pressure Injury 04/30/23 Sacrum Mid Stage 2 -  Partial thickness loss of dermis presenting as a shallow open injury with a red, pink wound bed without slough. 5 cm x 7 cm pink dry (Active)  04/30/23   Location: Sacrum  Location Orientation: Mid  Staging: Stage 2 -  Partial thickness loss of dermis presenting as a shallow open injury with a red, pink wound bed without slough.  Wound Description (Comments): 5 cm x 7 cm pink dry  Present on Admission: Yes                  Estimated body mass index is 21.37 kg/m as calculated from the following:   Height as of 03/17/23: 5\' 1"  (1.549 m).   Weight as of this encounter: 51.3 kg.   DVT prophylaxis: heparin  Code Status: full code Family Communication: Disposition Plan:  Status is: Inpatient Remains inpatient appropriate because: management of abdominal pain     Consultants:  Surgery Nephrology   Procedures:  HD  Antimicrobials:    Subjective: She is alert, complaining of 10/10 abdominal pain. RN getting IV access.    Objective:  Vitals:   04/30/23 1959 05/01/23 0338 05/01/23 0536 05/01/23 0811  BP: (!) 87/61 100/61  114/71  Pulse: (!) 110 (!) 104  (!) 109  Resp: 18     Temp: 100 F (37.8 C) 99.1 F (37.3 C)  99.7 F (37.6 C)  TempSrc: Oral Oral  Oral  SpO2:    97%  Weight:   51.3 kg    No  intake or output data in the 24 hours ending 05/01/23 1017 Filed Weights   05/01/23 0536  Weight: 51.3 kg    Examination:  General exam: Appears calm and comfortable  Respiratory system: Clear to auscultation. Respiratory effort normal. Cardiovascular system: S1 & S2 heard, RRR. Gastrointestinal system: Abdomen is nondistended, soft tender.  Central nervous system: Alert and oriented.  Extremities: Symmetric 5 x 5 power.    Data Reviewed: I have personally reviewed following labs and imaging studies  CBC: Recent Labs  Lab 04/30/23 0447  WBC 9.7  NEUTROABS 7.9*  HGB 10.1*  HCT 35.0*  MCV 89.1  PLT 199   Basic Metabolic Panel: Recent Labs  Lab 04/30/23 0447  NA 138  K 4.8  CL 94*  CO2 26  GLUCOSE 228*  BUN 46*  CREATININE 5.74*  CALCIUM 9.0   GFR: Estimated Creatinine Clearance: 9.7 mL/min (A) (by C-G formula based on SCr of 5.74 mg/dL (H)). Liver Function Tests: Recent Labs  Lab 04/30/23 0447  AST 21  ALT 16  ALKPHOS 92  BILITOT 0.7  PROT 6.9  ALBUMIN 2.5*   No results for input(s): "LIPASE", "AMYLASE" in the last 168 hours. No results for input(s): "AMMONIA" in the last 168 hours. Coagulation Profile: Recent Labs  Lab 04/30/23 0447  INR 1.3*   Cardiac Enzymes: No results for input(s): "CKTOTAL", "CKMB", "CKMBINDEX", "TROPONINI" in the last 168 hours. BNP (last 3 results) No results for input(s): "PROBNP" in the last 8760 hours. HbA1C: Recent Labs    04/30/23 0930  HGBA1C 5.7*   CBG: Recent Labs  Lab 04/30/23 1717 04/30/23 1957 05/01/23 0342 05/01/23 0517 05/01/23 0811  GLUCAP 108* 133* 79 74 77   Lipid Profile: No results for input(s): "CHOL", "HDL", "LDLCALC", "TRIG", "CHOLHDL", "LDLDIRECT" in the last 72 hours. Thyroid Function Tests: No results for input(s): "TSH", "T4TOTAL", "FREET4", "T3FREE", "THYROIDAB" in the last 72 hours. Anemia Panel: No results for input(s): "VITAMINB12", "FOLATE", "FERRITIN", "TIBC", "IRON",  "RETICCTPCT" in the last 72 hours. Sepsis Labs: Recent Labs  Lab 04/30/23 0506 04/30/23 0728 04/30/23 1417  LATICACIDVEN 1.9 2.5* 1.7    Recent Results (from the past 240 hour(s))  Blood Culture (routine x 2)     Status: None (Preliminary result)   Collection Time: 04/30/23  3:56 AM   Specimen: BLOOD LEFT ARM  Result Value Ref Range Status   Specimen Description BLOOD LEFT ARM  Final   Special Requests   Final    BOTTLES DRAWN AEROBIC AND ANAEROBIC Blood Culture results may not be optimal due to an excessive volume of blood received in culture bottles   Culture   Final    NO GROWTH < 12 HOURS Performed at [facility], [address] [place], [place] [address]    Report Status PENDING  Incomplete  Blood Culture (routine x 2)     Status: None (Preliminary result)   Collection Time: 04/30/23  3:56 AM   Specimen: BLOOD RIGHT ARM  Result Value Ref Range Status   Specimen Description BLOOD RIGHT ARM  Final   Special Requests  Final    BOTTLES DRAWN AEROBIC AND ANAEROBIC Blood Culture adequate volume   Culture   Final    NO GROWTH < 12 HOURS Performed at [facility], [address], [place], [place] [address]    Report Status PENDING  Incomplete  SARS Coronavirus 2 by RT PCR (hospital order, performed in [facility] hospital lab) *cepheid single result test* Anterior Nasal Swab     Status: None   Collection Time: 04/30/23  6:50 AM   Specimen: Anterior Nasal Swab  Result Value Ref Range Status   SARS Coronavirus 2 by RT PCR NEGATIVE NEGATIVE Final    Comment: Performed at [facility], [address] [place], [place] [address]         Radiology Studies: CT ABDOMEN PELVIS W CONTRAST  Result Date: 04/30/2023 CLINICAL DATA:  Generalized abdominal pain. EXAM: CT ABDOMEN AND PELVIS WITH CONTRAST TECHNIQUE: Multidetector CT imaging of the abdomen and pelvis was performed using the standard protocol following bolus administration of intravenous contrast.  RADIATION DOSE REDUCTION: This exam was performed according to the departmental dose-optimization program which includes automated exposure control, adjustment of the mA and/or kV according to patient size and/or use of iterative reconstruction technique. CONTRAST:  75mL OMNIPAQUE IOHEXOL 350 MG/ML SOLN COMPARISON:  CTA abdomen and pelvis 12/22/2022, CT abdomen and pelvis no contrast 02/09/2022. FINDINGS: Lower chest: There is asymmetric with subpleural atelectasis in the left lower lobe possibly positional, with patient left posterior oblique. Lung bases are clear of infiltrates. The cardiac size is normal. There is three-vessel coronary artery calcification, small hiatal hernia. No pericardial effusion. Hepatobiliary: There is no hepatic mass enhancement. There are stones in the gallbladder without wall thickening. The common bile duct is prominent but no more than previously, measuring 11 mm without visible filling defect. There is only slight central intrahepatic biliary prominence. Pancreas: The gland is partially atrophic with mild prominence in the duct, but this was seen previously. There is no mass enhancement. Spleen: There is a linear hypodensity in the medial aspect of the spleen which was seen previously, preferably reflecting a chronic infarct. There is no mass enhancement. Adrenals/Urinary Tract: Both kidneys show moderate atrophy. 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 There are extensive renovascular calcifications. No collecting system stones or hydronephrosis are seen. There is no adrenal or renal mass enhancement. The bladder is unremarkable for the degree of distention. Stomach/Bowel: There is increased diffuse thickening of the gastric wall greater distally. Consider endoscopic follow-up. The duodenum and jejunum are unremarkable but there is dilatation of ileal small bowel in the mid to lower abdomen up to 4 cm. The exact  transition to decompressed caliber and obstructive etiology are not identified but the most distal ileal segments are decompressed. Findings are compatible with small bowel obstruction. Evaluation of the colon demonstrates an upper limit of normal caliber retrocecal appendix without inflammatory change. This is unchanged. There is increased diffuse fold thickening in the ascending and transverse colon and increasingly also in the most distal descending and sigmoid segments consistent with colitis whether inflammatory, infectious or ischemic. There is no wall pneumatosis portal venous gas. Vascular/Lymphatic: There is extensive aortoiliac and visceral branch vessel calcific atherosclerosis, including the small visceral branch arteries to the mesentery, both kidneys, spleen and liver, especially unusual considering young age. There is no AAA. No enlarged lymph nodes are seen. Reproductive: There are extensive vascular calcifications in the uterus. The uterus and  adnexal spaces are otherwise unremarkable. Other: No abdominal wall hernia or abnormality. Minimal pelvic ascites. No drainable pocket. No free air or free hemorrhage. No localizing collections. Musculoskeletal: No acute or significant osseous findings. IMPRESSION: 1. Evidence of small-bowel obstruction with dilated ileal segments up to 4 cm. The exact transition to decompressed caliber in the obstructive etiology not identified but the most distal ileal segments are decompressed. 2. Increased diffuse fold thickening in the ascending and transverse colon and in the most distal descending and sigmoid segments consistent with colitis whether inflammatory, infectious or ischemic. No pneumatosis or portal venous gas. 3. Increased diffuse gastric wall thickening greater distally. Consider endoscopic follow-up. 4. Cholelithiasis with mild chronic prominence of the common bile duct but no visible filling defect. 5. Extensive vascular calcifications including the  small visceral branch arteries to the mesentery, both kidneys, spleen and liver, markedly age-advanced. 6. Chronic linear hypodensity in the medial aspect of the spleen, probably a chronic infarct. 7. Small hiatal hernia. 8. 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 9. Minimal pelvic ascites. 10. Aortic and coronary artery atherosclerosis. Aortic Atherosclerosis (ICD10-I70.0). Electronically Signed   By: Almira Bar M.D.   On: 04/30/2023 07:11   DG Chest Port 1 View  Result Date: 04/30/2023 CLINICAL DATA:  Body aches and questionable sepsis EXAM: PORTABLE CHEST 1 VIEW COMPARISON:  12/22/2022 FINDINGS: Low volume chest with streaky density at the left base. There is no edema, air bronchogram, effusion, or pneumothorax. Stable heart size and mediastinal contours accounting for leftward rotation. IMPRESSION: Atelectasis or infiltrate at the left lung base.  Low lung volumes. Electronically Signed   By: Tiburcio Pea M.D.   On: 04/30/2023 04:49        Scheduled Meds:  Chlorhexidine Gluconate Cloth  6 each Topical Q0600   heparin  5,000 Units Subcutaneous Q8H   insulin aspart  0-9 Units Subcutaneous Q4H   insulin glargine-yfgn  5 Units Subcutaneous QHS   pantoprazole (PROTONIX) IV  40 mg Intravenous QHS   Continuous Infusions:  ciprofloxacin 400 mg (04/30/23 1832)   dextrose     metronidazole 500 mg (05/01/23 0343)     LOS: 1 day    Time spent: 35 minutes    Conor Filsaime A Rashema Seawright, MD [facility]   If 7PM-7AM, please contact night-coverage [email or web address]  05/01/2023, 10:17 AM

## 2023-05-01 NOTE — Plan of Care (Signed)

## 2023-05-01 NOTE — Progress Notes (Signed)
   05/01/23 1457  During Treatment Monitoring  Intra-Hemodialysis Comments  (Informed Dr. Arlean Hopping of low BP.  Midodrine ordered and given, see MAR.  BP parameters given by Dr. Arlean Hopping: keep SBP>90 and ok to have UF off due to weight only 2kg up and if BP stable to try UF goal of 1L.)   Stacie Glaze, LPN-KDU

## 2023-05-01 NOTE — Progress Notes (Addendum)
       Subjective: Continues to have severe abdominal pain today. Lactated normalized yesterday afternoon. Labs have not been sent this morning. Afebrile.   Objective: Vital signs in last 24 hours: Temp:  [99.1 F (37.3 C)-100 F (37.8 C)] 99.7 F (37.6 C) (09/18 0811) Pulse Rate:  [104-110] 109 (09/18 0811) Resp:  [18] 18 (09/17 1959) BP: (87-114)/(61-71) 114/71 (09/18 0811) SpO2:  [97 %] 97 % (09/18 0811) Weight:  [51.3 kg] 51.3 kg (09/18 0536) Last BM Date :  (UTA)  Intake/Output from previous day: No intake/output data recorded. Intake/Output this shift: No intake/output data recorded.  PE: General: resting comfortably, NAD Neuro: alert and oriented, no focal deficits Resp: normal work of breathing on room air Abdomen: soft, nondistended, diffusely tender to palpation   Lab Results:  Recent Labs    04/30/23 0447  WBC 9.7  HGB 10.1*  HCT 35.0*  PLT 199   BMET Recent Labs    04/30/23 0447  NA 138  K 4.8  CL 94*  CO2 26  GLUCOSE 228*  BUN 46*  CREATININE 5.74*  CALCIUM 9.0   PT/INR Recent Labs    04/30/23 0447  LABPROT 16.5*  INR 1.3*   CMP     Component Value Date/Time   NA 138 04/30/2023 0447   NA 133 (L) 05/18/2020 0927   K 4.8 04/30/2023 0447   CL 94 (L) 04/30/2023 0447   CO2 26 04/30/2023 0447   GLUCOSE 228 (H) 04/30/2023 0447   BUN 46 (H) 04/30/2023 0447   BUN 45 (H) 05/18/2020 0927   CREATININE 5.74 (H) 04/30/2023 0447   CALCIUM 9.0 04/30/2023 0447   CALCIUM 12.1 (H) 11/13/2021 2026   PROT 6.9 04/30/2023 0447   PROT 7.6 05/18/2020 0927   ALBUMIN 2.5 (L) 04/30/2023 0447   ALBUMIN 3.9 05/18/2020 0927   AST 21 04/30/2023 0447   ALT 16 04/30/2023 0447   ALKPHOS 92 04/30/2023 0447   BILITOT 0.7 04/30/2023 0447   BILITOT <0.2 05/18/2020 0927   GFRNONAA 9 (L) 04/30/2023 0447   GFRAA 6 (L) 05/18/2020 0927   Lipase     Component Value Date/Time   LIPASE 40 12/20/2022 0918     Assessment/Plan 42 yo female presenting with  abdominal pain, diarrhea, low grade fevers, dilated small bowel and colonic wall thickening on CT. Lactate normalized as of yesterday afternoon. Clinical picture seems more consistent with enteritis/colitis. Abdomen is very soft and nondistended this morning, although still tender to palpation. Bowel gas pattern looks nonobstructive on XR this morning, final read is pending. Continue bowel rest and IV antibiotics. Plan for CTA today given ongoing abdominal pain and tenderness. Labs this morning are pending. Ok for ice chips and sips of water by mouth.     LOS: 1 day    Sophronia Simas, MD [facility] Surgery General, Hepatobiliary and Pancreatic Surgery 05/01/23 9:41 AM

## 2023-05-01 NOTE — Consult Note (Signed)
WOC Nurse Consult Note: Reason for Consult: sacral wound  Wound type: Stage 2 Sacrum  Pressure Injury POA: Yes Measurement: 5 cm x 7 cm pink intact skin (has appearance of healed pressure injury)  Wound bed: pink dry  Drainage (amount, consistency, odor)  none  Periwound: intact  Dressing procedure/placement/frequency: Clean sacrum with soap and water, dry and apply silicone foam. Lift foam to assess skin daily.  Change foam q3 days and prn soiling  POC discussed with bedside nurse. WOC team will not follow at this time. Re-consult if further needs arise.   Thank you,    Priscella Mann MSN, RN-BC, [employer] [phone]

## 2023-05-01 NOTE — Plan of Care (Signed)
Problem: Education: Goal: Knowledge of the prescribed therapeutic regimen will improve Outcome: Progressing Goal: Ability to verbalize activity precautions or restrictions will improve Outcome: Progressing Goal: Understanding of discharge needs will improve Outcome: Progressing   Problem: Activity: Goal: Ability to perform//tolerate increased activity and mobilize with assistive devices will improve Outcome: Progressing   Problem: Clinical Measurements: Goal: Postoperative complications will be avoided or minimized Outcome: Progressing   Problem: Self-Care: Goal: Ability to meet self-care needs will improve Outcome: Progressing   Problem: Self-Concept: Goal: Ability to maintain and perform role responsibilities to the fullest extent possible will improve Outcome: Progressing   Problem: Pain Management: Goal: Pain level will decrease with appropriate interventions Outcome: Progressing   Problem: Skin Integrity: Goal: Demonstration of wound healing without infection will improve Outcome: Progressing   Problem: Education: Goal: Understanding of cardiac disease, CV risk reduction, and recovery process will improve Outcome: Progressing Goal: Individualized Educational Video(s) Outcome: Progressing   Problem: Activity: Goal: Ability to tolerate increased activity will improve Outcome: Progressing   Problem: Cardiac: Goal: Ability to achieve and maintain adequate cardiovascular perfusion will improve Outcome: Progressing   Problem: Health Behavior/Discharge Planning: Goal: Ability to safely manage health-related needs after discharge will improve Outcome: Progressing   Problem: Education: Goal: Understanding of CV disease, CV risk reduction, and recovery process will improve Outcome: Progressing Goal: Individualized Educational Video(s) Outcome: Progressing   Problem: Activity: Goal: Ability to return to baseline activity level will improve Outcome: Progressing    Problem: Cardiovascular: Goal: Ability to achieve and maintain adequate cardiovascular perfusion will improve Outcome: Progressing Goal: Vascular access site(s) Level 0-1 will be maintained Outcome: Progressing   Problem: Health Behavior/Discharge Planning: Goal: Ability to safely manage health-related needs after discharge will improve Outcome: Progressing   Problem: Education: Goal: Ability to describe self-care measures that may prevent or decrease complications (Diabetes Survival Skills Education) will improve Outcome: Progressing Goal: Individualized Educational Video(s) Outcome: Progressing   Problem: Coping: Goal: Ability to adjust to condition or change in health will improve Outcome: Progressing   Problem: Fluid Volume: Goal: Ability to maintain a balanced intake and output will improve Outcome: Progressing   Problem: Health Behavior/Discharge Planning: Goal: Ability to identify and utilize available resources and services will improve Outcome: Progressing Goal: Ability to manage health-related needs will improve Outcome: Progressing   Problem: Metabolic: Goal: Ability to maintain appropriate glucose levels will improve Outcome: Progressing   Problem: Nutritional: Goal: Maintenance of adequate nutrition will improve Outcome: Progressing Goal: Progress toward achieving an optimal weight will improve Outcome: Progressing   Problem: Skin Integrity: Goal: Risk for impaired skin integrity will decrease Outcome: Progressing   Problem: Tissue Perfusion: Goal: Adequacy of tissue perfusion will improve Outcome: Progressing   Problem: Education: Goal: Knowledge of General Education information will improve Description: Including pain rating scale, medication(s)/side effects and non-pharmacologic comfort measures Outcome: Progressing   Problem: Health Behavior/Discharge Planning: Goal: Ability to manage health-related needs will improve Outcome: Progressing    Problem: Clinical Measurements: Goal: Ability to maintain clinical measurements within normal limits will improve Outcome: Progressing Goal: Will remain free from infection Outcome: Progressing Goal: Diagnostic test results will improve Outcome: Progressing Goal: Respiratory complications will improve Outcome: Progressing Goal: Cardiovascular complication will be avoided Outcome: Progressing   Problem: Activity: Goal: Risk for activity intolerance will decrease Outcome: Progressing   Problem: Nutrition: Goal: Adequate nutrition will be maintained Outcome: Progressing   Problem: Coping: Goal: Level of anxiety will decrease Outcome: Progressing   Problem: Elimination: Goal: Will not experience complications related  to bowel motility Outcome: Progressing Goal: Will not experience complications related to urinary retention Outcome: Progressing   Problem: Pain Managment: Goal: General experience of comfort will improve Outcome: Progressing

## 2023-05-02 DIAGNOSIS — R509 Fever, unspecified: Secondary | ICD-10-CM

## 2023-05-02 DIAGNOSIS — K56609 Unspecified intestinal obstruction, unspecified as to partial versus complete obstruction: Secondary | ICD-10-CM | POA: Diagnosis not present

## 2023-05-02 LAB — CBC
HCT: 26.9 % — ABNORMAL LOW (ref 36.0–46.0)
Hemoglobin: 8.4 g/dL — ABNORMAL LOW (ref 12.0–15.0)
MCH: 26.3 pg (ref 26.0–34.0)
MCHC: 31.2 g/dL (ref 30.0–36.0)
MCV: 84.3 fL (ref 80.0–100.0)
Platelets: 240 10*3/uL (ref 150–400)
RBC: 3.19 MIL/uL — ABNORMAL LOW (ref 3.87–5.11)
RDW: 18.5 % — ABNORMAL HIGH (ref 11.5–15.5)
WBC: 13.1 10*3/uL — ABNORMAL HIGH (ref 4.0–10.5)
nRBC: 0.2 % (ref 0.0–0.2)

## 2023-05-02 LAB — GLUCOSE, CAPILLARY
Glucose-Capillary: 115 mg/dL — ABNORMAL HIGH (ref 70–99)
Glucose-Capillary: 116 mg/dL — ABNORMAL HIGH (ref 70–99)
Glucose-Capillary: 121 mg/dL — ABNORMAL HIGH (ref 70–99)
Glucose-Capillary: 138 mg/dL — ABNORMAL HIGH (ref 70–99)
Glucose-Capillary: 144 mg/dL — ABNORMAL HIGH (ref 70–99)
Glucose-Capillary: 99 mg/dL (ref 70–99)

## 2023-05-02 LAB — GASTROINTESTINAL PANEL BY PCR, STOOL (REPLACES STOOL CULTURE)

## 2023-05-02 LAB — C DIFFICILE QUICK SCREEN W PCR REFLEX
C Diff antigen: POSITIVE — AB
C Diff toxin: NEGATIVE

## 2023-05-02 LAB — BASIC METABOLIC PANEL WITH GFR
Anion gap: 15 (ref 5–15)
BUN: 32 mg/dL — ABNORMAL HIGH (ref 6–20)
CO2: 27 mmol/L (ref 22–32)
Calcium: 8.1 mg/dL — ABNORMAL LOW (ref 8.9–10.3)
Chloride: 92 mmol/L — ABNORMAL LOW (ref 98–111)
Creatinine, Ser: 4.13 mg/dL — ABNORMAL HIGH (ref 0.44–1.00)
GFR, Estimated: 13 mL/min — ABNORMAL LOW
Glucose, Bld: 149 mg/dL — ABNORMAL HIGH (ref 70–99)
Potassium: 4.2 mmol/L (ref 3.5–5.1)
Sodium: 134 mmol/L — ABNORMAL LOW (ref 135–145)

## 2023-05-02 LAB — LACTIC ACID, PLASMA
Lactic Acid, Venous: 2.2 mmol/L (ref 0.5–1.9)
Lactic Acid, Venous: 1.9 mmol/L (ref 0.5–1.9)

## 2023-05-02 LAB — CLOSTRIDIUM DIFFICILE BY PCR, REFLEXED: Toxigenic C. Difficile by PCR: NEGATIVE

## 2023-05-02 MED ORDER — CHLORHEXIDINE GLUCONATE CLOTH 2 % EX PADS
6.0000 | MEDICATED_PAD | Freq: Every day | CUTANEOUS | Status: DC
  Administered 2023-05-03 – 2023-05-06 (×3): 6 via TOPICAL

## 2023-05-02 MED ORDER — INSULIN GLARGINE-YFGN 100 UNIT/ML ~~LOC~~ SOLN
5.0000 [IU] | Freq: Every day | SUBCUTANEOUS | Status: DC
  Administered 2023-05-02 – 2023-05-13 (×11): 5 [IU] via SUBCUTANEOUS
  Filled 2023-05-02 (×13): qty 0.05

## 2023-05-02 NOTE — Progress Notes (Signed)
Patient seen and examined this afternoon with the patient's bedside nurse [facility] had intermittent fevers overnight.  Her white count slightly bumped to 13,000 today up from 10.  Her lactate remains elevated but stable slightly around 2.  C. difficile negative Still having bowel movements but no melena or hematochezia  Patient sound asleep A little bit difficult to awake.  Nurse reports pt had some pain medicine at around 12:00  Her abdomen is soft, not rigid, not terribly distended.  She complains of tenderness to palpation when I palpate   Patient has had intermittent fevers, with a stable elevated lactate and a little bit of a bump in her white count today.  CTA reviewed from yesterday.  There is no pneumatosis.  The large small vessels are patent.  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  There is no portal venous gas.  I did offer a diagnostic laparoscopy to evaluate her bowel.  We discussed rationale for this versus pros and cons of not evaluating but patient fell asleep easily and closed her eyes during the conversation at multiple times.  She states that she heard me.  She did decline surgery.  She could tell me it was September 2024 and she was in the hospital.  The nurse and I both had some concerns of her ability to understand what we were talking about at the present moment.  So I tried reaching out to her healthcare power of attorney-Sister-twice.  There was no answer and not an ability to leave a voicemail.  Also tried reaching her husband also no answer and not an ability to leave a voicemail  I reviewed the imaging of her CT angio with vascular surgery and said there was no vascular intervention option   I advised the nurse to keep our team posted if there is significant change in patient vital signs or if she complains of worsening abdominal pain  Discussed with the hospitalist as well  Mary Sella. Andrey Campanile, MD, FACS General, Bariatric, & Minimally Invasive  Surgery [facility] Surgery,  [facility]

## 2023-05-02 NOTE — Plan of Care (Signed)
Problem: Pain Managment: Goal: General experience of comfort will improve Outcome: Progressing   Problem: Safety: Goal: Ability to remain free from injury will improve Outcome: Progressing   Problem: Skin Integrity: Goal: Risk for impaired skin integrity will decrease Outcome: Progressing

## 2023-05-02 NOTE — Progress Notes (Signed)
  [facility] Progress Note   Subjective:  Seen in room. Continued abdominal pain. "I'm in pain, I'm in pain"   Objective Vitals:   05/02/23 0755 05/02/23 0823 05/02/23 1111 05/02/23 1118  BP: 119/80   (!) 95/54  Pulse: (!) 113 (!) 108    Resp: 16     Temp: (!) 100.7 F (38.2 C)  (!) 100.4 F (38 C) 99.4 F (37.4 C)  TempSrc: Oral  Oral Oral  SpO2: 98%     Weight:         Additional Objective Labs: Basic Metabolic Panel: Recent Labs  Lab 04/30/23 0447 05/01/23 1020  NA 138 141  K 4.8 6.2*  CL 94* 98  CO2 26 25  GLUCOSE 228* 83  BUN 46* 72*  CREATININE 5.74* 7.57*  CALCIUM 9.0 8.3*   CBC: Recent Labs  Lab 04/30/23 0447 05/01/23 1020 05/02/23 1041  WBC 9.7 10.1 13.1*  NEUTROABS 7.9*  --   --   HGB 10.1* 8.4* 8.4*  HCT 35.0* 27.6* 26.9*  MCV 89.1 83.9 84.3  PLT 199 219 240   Blood Culture    Component Value Date/Time   SDES BLOOD LEFT ARM 04/30/2023 0356   SDES BLOOD RIGHT ARM 04/30/2023 0356   SPECREQUEST  04/30/2023 0356    BOTTLES DRAWN AEROBIC AND ANAEROBIC Blood Culture results may not be optimal due to an excessive volume of blood received in culture bottles   SPECREQUEST  04/30/2023 0356    BOTTLES DRAWN AEROBIC AND ANAEROBIC Blood Culture adequate volume   CULT  04/30/2023 0356    NO GROWTH 2 DAYS Performed at [facility], [address] [place], [place] [address]    CULT  04/30/2023 0356    NO GROWTH 2 DAYS Performed at [facility], [address] [place], [place] [address]    REPTSTATUS PENDING 04/30/2023 0356   REPTSTATUS PENDING 04/30/2023 0356     Physical Exam General: Lying in bed, uncomfortable Heart: Regular rate Lungs: Bilateral breath sounds, normal wob Abdomen: soft , diffusely tender to palpation Extremities: b/l BKAs Dialysis Access: RUE AVF +bruit   Medications:  anticoagulant sodium citrate     ciprofloxacin 400 mg (04/30/23 1832)   dextrose 45 mL/hr at 05/02/23 0700   metronidazole  Stopped (05/02/23 0326)    heparin  5,000 Units Subcutaneous Q8H   insulin aspart  0-9 Units Subcutaneous Q4H   insulin glargine-yfgn  5 Units Subcutaneous Daily   pantoprazole (PROTONIX) IV  40 mg Intravenous QHS    Dialysis Orders: [facility] MWF  3h  400/1.5  49kg   R AVF  Hep none - last OP HD 9/16, post wt 51kg, coming off 2- 4kg over the last 3 wks - mircera IV q 2 wks, last 9/13, due 9/27 - hectorol 4 mcg IV three times per week  Assessment/Plan: Abdominal pain - partial SBO per CT scan. CXR neg. Gen surg consulting. Colitis - per CT as well. GI panel ordered and cipro/ flagyl.  DMT1 - per pmd ESRD - on HD MWF. Next HD 9/20  Hyperkalemia. K 6.2 yesterday. Strange as she is NPO. Follow labs.   Volume - no vol excess on exam.  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  Follow.  MBD ckd - CCa in range, add on phos. Cont IV vdra. Resume sensipar when eating.  PAD - bilat AKA    Tomasa Blase PA-C [facility] 05/02/2023,11:39 AM

## 2023-05-02 NOTE — Progress Notes (Addendum)
       Subjective: Reports a slight improvement in her abdominal pain, which she reports is around her central abdomen. She denies nausea or vomiting. She reports flatus and BMs.   Objective: Vital signs in last 24 hours: Temp:  [97.9 F (36.6 C)-101.1 F (38.4 C)] 100.7 F (38.2 C) (09/19 0755) Pulse Rate:  [25-119] 104 (09/19 0600) Resp:  [13-27] 17 (09/19 0600) BP: (81-139)/(55-87) 119/80 (09/19 0755) SpO2:  [92 %-100 %] 98 % (09/19 0755) Weight:  [51.2 kg-51.4 kg] 51.3 kg (09/19 0430) Last BM Date : 05/02/23  Intake/Output from previous day: 09/18 0701 - 09/19 0700 In: 604 [I.V.:504; IV Piggyback:100] Out: 0  Intake/Output this shift: No intake/output data recorded.  PE: General: alert Neuro: alert and oriented, no focal deficits Resp: normal work of breathing on room air Abdomen: soft, mild distention, diffusely tender to palpation, more severe over umbilicus and lower abdomen, no rebound tenderness   Lab Results:  Recent Labs    04/30/23 0447 05/01/23 1020  WBC 9.7 10.1  HGB 10.1* 8.4*  HCT 35.0* 27.6*  PLT 199 219   BMET Recent Labs    04/30/23 0447 05/01/23 1020  NA 138 141  K 4.8 6.2*  CL 94* 98  CO2 26 25  GLUCOSE 228* 83  BUN 46* 72*  CREATININE 5.74* 7.57*  CALCIUM 9.0 8.3*   PT/INR Recent Labs    04/30/23 0447  LABPROT 16.5*  INR 1.3*   CMP     Component Value Date/Time   NA 141 05/01/2023 1020   NA 133 (L) 05/18/2020 0927   K 6.2 (H) 05/01/2023 1020   CL 98 05/01/2023 1020   CO2 25 05/01/2023 1020   GLUCOSE 83 05/01/2023 1020   BUN 72 (H) 05/01/2023 1020   BUN 45 (H) 05/18/2020 0927   CREATININE 7.57 (H) 05/01/2023 1020   CALCIUM 8.3 (L) 05/01/2023 1020   CALCIUM 12.1 (H) 11/13/2021 2026   PROT 6.2 (L) 05/01/2023 1020   PROT 7.6 05/18/2020 0927   ALBUMIN 2.1 (L) 05/01/2023 1020   ALBUMIN 3.9 05/18/2020 0927   AST 26 05/01/2023 1020   ALT 18 05/01/2023 1020   ALKPHOS 72 05/01/2023 1020   BILITOT 0.9 05/01/2023 1020    BILITOT <0.2 05/18/2020 0927   GFRNONAA 6 (L) 05/01/2023 1020   GFRAA 6 (L) 05/18/2020 0927   Lipase     Component Value Date/Time   LIPASE 40 12/20/2022 0918     Assessment/Plan 42 yo female presenting with abdominal pain, diarrhea, low grade fevers, dilated small bowel and colonic wall thickening on CT. Lactate increased again yesterday, raising the concern for mesenteric ischemia. 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 There is no pneumatosis of the bowel. The small bowel and colon do appear inflamed and the small bowel is dilated. Her clinical picture remains consistent with enterocolitis as a result of ischemia or infection. C.dif antigen positive, toxin negative. GI panel in process. Abdomen is slightly more distended and tender this morning compared to my exam on 9/17. Continue bowel rest and IV antibiotics. Re-check lactate. Serial abd exams. No emergent role for surgery this morning.     LOS: 2 days    Hosie Spangle, [facility] Surgery General and Trauma Surgery 05/02/23 8:57 AM

## 2023-05-02 NOTE — Progress Notes (Signed)
   05/02/23 1953  Assess: MEWS Score  Temp 98.7 F (37.1 C)  BP (!) 95/58  MAP (mmHg) 71  Pulse Rate (!) 105  ECG Heart Rate (!) 104  Resp 13  Level of Consciousness Alert  SpO2 96 %  O2 Device Room Air  Assess: MEWS Score  MEWS Temp 0  MEWS Systolic 1  MEWS Pulse 1  MEWS RR 1  MEWS LOC 0  MEWS Score 3  MEWS Score Color Yellow  Assess: if the MEWS score is Yellow or Red  Were vital signs accurate and taken at a resting state? Yes  Does the patient meet 2 or more of the SIRS criteria? Yes  Does the patient have a confirmed or suspected source of infection? No  MEWS guidelines implemented  Yes, yellow  Treat  MEWS Interventions Considered administering scheduled or prn medications/treatments as ordered  Take Vital Signs  Increase Vital Sign Frequency  Yellow: Q2hr x1, continue Q4hrs until patient remains green for 12hrs  Escalate  MEWS: Escalate Yellow: Discuss with charge nurse and consider notifying provider and/or RRT  Notify: Charge Nurse/RN  Name of Charge Nurse/RN Notified Tonya, RN  Assess: SIRS CRITERIA  SIRS Temperature  0  SIRS Pulse 1  SIRS Respirations  0  SIRS WBC 1  SIRS Score Sum  2

## 2023-05-02 NOTE — Consult Note (Signed)
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 This would be high in the differential for this admission as well, including sbo which is present on imaging  In terms of potential infection, micro w/u so far been negative. Even for some of the common food born bacterial gastroenteritis or primary bacterial gastroenteritis, these would be on the gi pcr and do not necessitate treatment unless severe disease  She has no sign/sx/history to suggest exposure to other systemic bacterial process with gi manifestation like this such as tick, typhoid   I agree with surgery team to continue monitor abd pain for potential complication of SBO and colonic ischemia. Again while abx at this stage for ischemic colitis is not indicated, severe progress such as pneumatosis could be considered on a case by case basis but data also questionable in terms of risk/benefit     Plan: Would stop abx at this time F/u final bcx Appreciate surgery evaluating/managing ischemic colitis/SBO Discussed with primary team      ------------------------------------------------ Principal Problem:   SBO (small bowel obstruction) (HCC)    HPI: [name] is a 42 y.o. 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 So hx is via chart review, and discussion from primary team/surgery team  She is from a facility and has no travel/sick visit known otherwise.   Denies ivdu  Appears she came in with acute onset generalized abd pain At this point complains of pain all over She has baseline loose stools unclear if anything changed   Other ros difficult to obtain from her but per chart no n/v/subjective fever or chill  She had full iHD run a day prior to admission  On presentation she had low grade fever 100.5, tahcycardic, but normotensive Wbc presenting is 9.7 Normal lft Lactate 1.7 but has been up and down in the low 2s Bcx negative Covid pcr negative Bcx negative Cdiff screen negative  Gi pcr negative  Given iv cipro/flagyl without change in sx   Cta abd pelv chronic atherosclerosis without obstructive thrombus; no sign of performation; distal small bowel obstruction demonstrated   She continues without n/v  Surgery following, observing for potential ischemic colitis      Family History  Problem Relation Age of Onset   Diabetes Mother    Diabetes Father     Social History   Tobacco Use   Smoking status: Never   Smokeless tobacco: Never  Vaping Use   Vaping status: Never Used  Substance Use Topics   Alcohol use: No   Drug  use: No    Allergies  Allergen Reactions   Peanut-Containing Drug Products Anaphylaxis and Hives   Penicillins Rash and Other (See Comments)    Rash in 2008.  Tolerated cefazolin in 2020. No reaction listed on MAR.   Shellfish Allergy Swelling   Chocolate Hives    Review of Systems: ROS All Other ROS was negative, except mentioned above   Past Medical History:  Diagnosis Date    Anemia    Anxiety    Blind right eye 2008   Diabetes mellitus without complication (HCC)    Embolic stroke (HCC)    ESRD (end stage renal disease) (HCC)    HD on M/W/F   ESRD on hemodialysis (HCC)    GBS (Guillain Barre syndrome) (HCC)        Scheduled Meds:  [START ON 05/03/2023] Chlorhexidine Gluconate Cloth  6 each Topical Q0600   heparin  5,000 Units Subcutaneous Q8H   insulin aspart  0-9 Units Subcutaneous Q4H   insulin glargine-yfgn  5 Units Subcutaneous Daily   pantoprazole (PROTONIX) IV  40 mg Intravenous QHS   Continuous Infusions:  anticoagulant sodium citrate     dextrose 45 mL/hr at 05/02/23 0700   PRN Meds:.acetaminophen **OR** acetaminophen, albuterol, alteplase, anticoagulant sodium citrate, bisacodyl, feeding supplement (NEPRO CARB STEADY), heparin, hydrALAZINE, HYDROcodone-acetaminophen, HYDROmorphone (DILAUDID) injection, lidocaine (PF), lidocaine-prilocaine, ondansetron **OR** ondansetron (ZOFRAN) IV, pentafluoroprop-tetrafluoroeth   OBJECTIVE: Blood pressure 96/60, pulse 99, temperature 99.9 F (37.7 C), temperature source Oral, resp. rate 15, weight 51.3 kg, SpO2 94%.  Physical Exam General/constitutional: no distress, pleasant HEENT: Normocephalic, PER, Conj Clear, EOMI, Oropharyn clear; poor dentitions with several missing teeth and a loose front tooth Neck supple CV: rrr no mrg Lungs: clear to auscultation, normal respiratory effort Abd: Soft, nondistended; tender diffusely moderately but no rebound or guarding Ext: no edema Skin: No Rash; warm/well perfused Neuro: nonfocal MSK: bilateral bka history; no obvious muscle tenderness detected      Lab Results Lab Results  Component Value Date   WBC 13.1 (H) 05/02/2023   HGB 8.4 (L) 05/02/2023   HCT 26.9 (L) 05/02/2023   MCV 84.3 05/02/2023   PLT 240 05/02/2023    Lab Results  Component Value Date   CREATININE 4.13 (H) 05/02/2023   BUN 32 (H) 05/02/2023   NA 134 (L) 05/02/2023   K 4.2  05/02/2023   CL 92 (L) 05/02/2023   CO2 27 05/02/2023    Lab Results  Component Value Date   ALT 18 05/01/2023   AST 26 05/01/2023   ALKPHOS 72 05/01/2023   BILITOT 0.9 05/01/2023      Microbiology: Recent Results (from the past 240 hour(s))  Blood Culture (routine x 2)     Status: None (Preliminary result)   Collection Time: 04/30/23  3:56 AM   Specimen: BLOOD LEFT ARM  Result Value Ref Range Status   Specimen Description BLOOD LEFT ARM  Final   Special Requests   Final    BOTTLES DRAWN AEROBIC AND ANAEROBIC Blood Culture results may not be optimal due to an excessive volume of blood received in culture bottles   Culture   Final    NO GROWTH 2 DAYS Performed at [facility], [address] [place], [place] [address]    Report Status PENDING  Incomplete  Blood Culture (routine x 2)     Status: None (Preliminary result)   Collection Time: 04/30/23  3:56 AM   Specimen: BLOOD RIGHT ARM  Result Value Ref Range  Status   Specimen Description BLOOD RIGHT ARM  Final   Special Requests   Final    BOTTLES DRAWN AEROBIC AND ANAEROBIC Blood Culture adequate volume   Culture   Final    NO GROWTH 2 DAYS Performed at [facility], [address], [place], [place] [address]    Report Status PENDING  Incomplete  SARS Coronavirus 2 by RT PCR (hospital order, performed in [facility] hospital lab) *cepheid single result test* Anterior Nasal Swab     Status: None   Collection Time: 04/30/23  6:50 AM   Specimen: Anterior Nasal Swab  Result Value Ref Range Status   SARS Coronavirus 2 by RT PCR NEGATIVE NEGATIVE Final    Comment: Performed at [facility], [address], [place], [place] [address]  Gastrointestinal Panel by PCR , Stool     Status: None   Collection Time: 05/02/23 12:06 AM   Specimen: Stool  Result Value Ref Range Status   Campylobacter species NOT DETECTED NOT DETECTED Final   Plesimonas shigelloides NOT DETECTED NOT DETECTED Final   Salmonella species  NOT DETECTED NOT DETECTED Final   Yersinia enterocolitica NOT DETECTED NOT DETECTED Final   Vibrio species NOT DETECTED NOT DETECTED Final   Vibrio cholerae NOT DETECTED NOT DETECTED Final   Enteroaggregative E coli (EAEC) NOT DETECTED NOT DETECTED Final   Enteropathogenic E coli (EPEC) NOT DETECTED NOT DETECTED Final   Enterotoxigenic E coli (ETEC) NOT DETECTED NOT DETECTED Final   Shiga like toxin producing E coli (STEC) NOT DETECTED NOT DETECTED Final   Shigella/Enteroinvasive E coli (EIEC) NOT DETECTED NOT DETECTED Final   Cryptosporidium NOT DETECTED NOT DETECTED Final   Cyclospora cayetanensis NOT DETECTED NOT DETECTED Final   Entamoeba histolytica NOT DETECTED NOT DETECTED Final   Giardia lamblia NOT DETECTED NOT DETECTED Final   Adenovirus F40/41 NOT DETECTED NOT DETECTED Final   Astrovirus NOT DETECTED NOT DETECTED Final   Norovirus GI/GII NOT DETECTED NOT DETECTED Final   Rotavirus A NOT DETECTED NOT DETECTED Final   Sapovirus (I, II, IV, and V) NOT DETECTED NOT DETECTED Final    Comment: Performed at [facility], [address] [place], [place] [address]  C Difficile Quick Screen w PCR reflex     Status: Abnormal   Collection Time: 05/02/23 12:06 AM   Specimen: STOOL  Result Value Ref Range Status   C Diff antigen POSITIVE (A) NEGATIVE Final   C Diff toxin NEGATIVE NEGATIVE Final   C Diff interpretation Results are indeterminate. See PCR results.  Final    Comment: Performed at [facility], [address], [place], [place] [address]  C. Diff by PCR, Reflexed     Status: None   Collection Time: 05/02/23 12:06 AM  Result Value Ref Range Status   Toxigenic C. Difficile by PCR NEGATIVE NEGATIVE Final    Comment: Patient is colonized with non toxigenic C. difficile. May not need treatment unless significant symptoms are present. Performed at [facility], [address] [place], [place] [address]      Serology:    Imaging: If present, new  imagings (plain films, ct scans, and mri) have been personally visualized and interpreted; radiology reports have been reviewed. Decision making incorporated into the Impression / Recommendations.  9/18 abd pelv cta VASCULAR 1. Diffuse severe calcified atherosclerosis. Extensive chronic mesenteric arterial calcified plaque and multifocal mesenteric artery stenoses. But no proximal mesenteric or other large artery occlusion is  identified. 2. No portal venous thrombosis.   NON-VASCULAR 1. Distal small bowel obstruction not significantly changed from yesterday. No perforation or abscess identified. The large bowel remains decompressed and also appears intermittently inflamed, especially the sigmoid colon. 2. Other chronic findings including partial renal atrophy, cholelithiasis, renal osteodystrophy.  Raymondo Band, MD [facility] [phone] pager    05/02/2023, 2:27 PM

## 2023-05-02 NOTE — Plan of Care (Signed)

## 2023-05-02 NOTE — Progress Notes (Addendum)
PROGRESS NOTE    [name]  ZOX:[id number] DOB: [birth year] DOA: 04/30/2023 PCP: [facility], [facility]   Brief Narrative: 42 year old with past medical history significant for diabetes type 1 with diabetic retinopathy, peripheral vascular disease status post bilateral BKA, ESRD on hemodialysis Monday Wednesday and Friday, hypertension, hyperlipidemia, history of CVA, history of chronic anemia, GBS, mesenteric ischemia with colonic pneumatosis May 2024 treated nonoperatively presents to [facility] on 9/17 from rehab with acute onset of abdominal pain that is started the day prior to admission associated with diarrhea.  Patient was found to be febrile, tachycardic, lactic acid 1.9 followed by 2.5, chest x-ray atelectasis versus infiltrate.  CT abdomen and pelvis with contrast shows evidence of a small bowel obstruction with dilated ileal segment up to 4 cm, sigmoid segment consistent with colitis, diffuse gastric wall thickening, cholelithiasis with mild chronic prominence of the common bile duct.  General surgery consulted and following concern for bowel ischemia.  Plan to continue n.p.o.    Assessment & Plan:   Principal Problem:   SBO (small bowel obstruction) (HCC)   1-Partial small bowel obstruction versus Ischemic Colitis versus Infectious colitis -Patient presented with abdominal pain, lactic acidosis, CT abdomen and pelvis consistent with SBO and colitis -Appreciate general surgery recommendation plan to continue with n.p.o. status -Continue IV ciprofloxacin and Flagyl -C diff PCR negative, c diff antigen positive. ID consulted.  -CT abdomen and pelvis angio negative for complete occlusion.  ID consulted. Spiking fevers, leukocytosis.    2-Diabetes type 1: CBG 74.  She is currently NPO.  Will start low rate D5.  Semglee  to daily Monitor CBG B-met no acidosis gap normal.   3-peripheral vascular disease status post bilateral BKA -Continue statins while  NPO  4-ESRD on hemodialysis Monday Wednesday and Friday, hyperkalemia Received HD 9/18.  5-Essential Hypertension: To hold blood pressure medications  Hyperlipidemia: Continue to hold statins Chronic anemia anemia of chronic disease: Continue to monitor hemoglobin   See wound care documentation below.   Pressure Injury 04/30/23 Sacrum Mid Stage 2 -  Partial thickness loss of dermis presenting as a shallow open injury with a red, pink wound bed without slough. 5 cm x 7 cm pink dry (Active)  04/30/23   Location: Sacrum  Location Orientation: Mid  Staging: Stage 2 -  Partial thickness loss of dermis presenting as a shallow open injury with a red, pink wound bed without slough.  Wound Description (Comments): 5 cm x 7 cm pink dry  Present on Admission: Yes  Dressing Type Foam - Lift dressing to assess site every shift 05/01/23 2000     Estimated body mass index is 21.37 kg/m as calculated from the following:   Height as of 03/17/23: 5\' 1"  (1.549 m).   Weight as of this encounter: 51.3 kg.   DVT prophylaxis: heparin  Code Status: full code Family Communication: Disposition Plan:  Status is: Inpatient Remains inpatient appropriate because: management of abdominal pain     Consultants:  Surgery Nephrology   Procedures:  HD  Antimicrobials:    Subjective: She is complaining of abdominal pain 9/10   Objective: Vitals:   05/02/23 0430 05/02/23 0500 05/02/23 0600 05/02/23 0755  BP: 114/73   119/80  Pulse: (!) 119 (!) 107 (!) 104   Resp: 17 17 17    Temp: (!) 101.1 F (38.4 C)  (!) 101 F (38.3 C) (!) 100.7 F (38.2 C)  TempSrc: Oral  Oral Oral  SpO2: 92% 95% 95% 98%  Weight: 51.3 kg       Intake/Output Summary (Last 24 hours) at 05/02/2023 0840 Last data filed at 05/02/2023 0700 Gross per 24 hour  Intake 604 ml  Output 0 ml  Net 604 ml   Filed Weights   05/01/23 1414 05/01/23 1832 05/02/23 0430  Weight: 51.2 kg 51.4 kg 51.3 kg    Examination:  General  exam: NAD Respiratory system: CTA Cardiovascular system: S 1, S 2 RRR Gastrointestinal system: BS present, tender,  Central nervous system: alert    Data Reviewed: I have personally reviewed following labs and imaging studies  CBC: Recent Labs  Lab 04/30/23 0447 05/01/23 1020  WBC 9.7 10.1  NEUTROABS 7.9*  --   HGB 10.1* 8.4*  HCT 35.0* 27.6*  MCV 89.1 83.9  PLT 199 219   Basic Metabolic Panel: Recent Labs  Lab 04/30/23 0447 05/01/23 1020  NA 138 141  K 4.8 6.2*  CL 94* 98  CO2 26 25  GLUCOSE 228* 83  BUN 46* 72*  CREATININE 5.74* 7.57*  CALCIUM 9.0 8.3*   GFR: Estimated Creatinine Clearance: 7.4 mL/min (A) (by C-G formula based on SCr of 7.57 mg/dL (H)). Liver Function Tests: Recent Labs  Lab 04/30/23 0447 05/01/23 1020  AST 21 26  ALT 16 18  ALKPHOS 92 72  BILITOT 0.7 0.9  PROT 6.9 6.2*  ALBUMIN 2.5* 2.1*   No results for input(s): "LIPASE", "AMYLASE" in the last 168 hours. No results for input(s): "AMMONIA" in the last 168 hours. Coagulation Profile: Recent Labs  Lab 04/30/23 0447  INR 1.3*   Cardiac Enzymes: No results for input(s): "CKTOTAL", "CKMB", "CKMBINDEX", "TROPONINI" in the last 168 hours. BNP (last 3 results) No results for input(s): "PROBNP" in the last 8760 hours. HbA1C: Recent Labs    04/30/23 0930  HGBA1C 5.7*   CBG: Recent Labs  Lab 05/01/23 1218 05/01/23 1947 05/01/23 2338 05/02/23 0426 05/02/23 0756  GLUCAP 73 90 100* 138* 144*   Lipid Profile: No results for input(s): "CHOL", "HDL", "LDLCALC", "TRIG", "CHOLHDL", "LDLDIRECT" in the last 72 hours. Thyroid Function Tests: No results for input(s): "TSH", "T4TOTAL", "FREET4", "T3FREE", "THYROIDAB" in the last 72 hours. Anemia Panel: No results for input(s): "VITAMINB12", "FOLATE", "FERRITIN", "TIBC", "IRON", "RETICCTPCT" in the last 72 hours. Sepsis Labs: Recent Labs  Lab 04/30/23 0506 04/30/23 0728 04/30/23 1417 05/01/23 1020  PROCALCITON  --   --   --  32.25   LATICACIDVEN 1.9 2.5* 1.7 2.3*    Recent Results (from the past 240 hour(s))  Blood Culture (routine x 2)     Status: None (Preliminary result)   Collection Time: 04/30/23  3:56 AM   Specimen: BLOOD LEFT ARM  Result Value Ref Range Status   Specimen Description BLOOD LEFT ARM  Final   Special Requests   Final    BOTTLES DRAWN AEROBIC AND ANAEROBIC Blood Culture results may not be optimal due to an excessive volume of blood received in culture bottles   Culture   Final    NO GROWTH 2 DAYS Performed at [facility], [address], [place], [place] [address]    Report Status PENDING  Incomplete  Blood Culture (routine x 2)     Status: None (Preliminary result)   Collection Time: 04/30/23  3:56 AM   Specimen: BLOOD RIGHT ARM  Result Value Ref Range Status   Specimen Description BLOOD RIGHT ARM  Final   Special Requests   Final    BOTTLES DRAWN AEROBIC AND  ANAEROBIC Blood Culture adequate volume   Culture   Final    NO GROWTH 2 DAYS Performed at [facility], [address], [place], [place] [address]    Report Status PENDING  Incomplete  SARS Coronavirus 2 by RT PCR (hospital order, performed in [facility] hospital lab) *cepheid single result test* Anterior Nasal Swab     Status: None   Collection Time: 04/30/23  6:50 AM   Specimen: Anterior Nasal Swab  Result Value Ref Range Status   SARS Coronavirus 2 by RT PCR NEGATIVE NEGATIVE Final    Comment: Performed at [facility], [address], [place], [place] [address]  C Difficile Quick Screen w PCR reflex     Status: Abnormal   Collection Time: 05/02/23 12:06 AM   Specimen: STOOL  Result Value Ref Range Status   C Diff antigen POSITIVE (A) NEGATIVE Final   C Diff toxin NEGATIVE NEGATIVE Final   C Diff interpretation Results are indeterminate. See PCR results.  Final    Comment: Performed at [facility], [address] [place], [place] [address]  C. Diff by PCR, Reflexed     Status: None    Collection Time: 05/02/23 12:06 AM  Result Value Ref Range Status   Toxigenic C. Difficile by PCR NEGATIVE NEGATIVE Final    Comment: Patient is colonized with non toxigenic C. difficile. May not need treatment unless significant symptoms are present. Performed at [facility], [address] [place], [place] [address]          Radiology Studies: CT Angio Abd/Pel w/ and/or w/o  Result Date: 05/02/2023 CLINICAL DATA:  42 year old female with abdominal pain, diarrhea, low-grade fever, abnormal bowel on CT Abdomen and Pelvis 2 days ago. History of End stage renal disease, on dialysis, mesenteric artery stenosis. EXAM: CTA ABDOMEN AND PELVIS WITHOUT AND WITH CONTRAST TECHNIQUE: Multidetector CT imaging of the abdomen and pelvis was performed using the standard protocol during bolus administration of intravenous contrast. Multiplanar reconstructed images and MIPs were obtained and reviewed to evaluate the vascular anatomy. RADIATION DOSE REDUCTION: This exam was performed according to the departmental dose-optimization program which includes automated exposure control, adjustment of the mA and/or kV according to patient size and/or use of iterative reconstruction technique. CONTRAST:  75mL OMNIPAQUE IOHEXOL 350 MG/ML SOLN COMPARISON:  CT Abdomen and Pelvis 04/30/2023. CTA abdomen and pelvis 12/22/2022. FINDINGS: VASCULAR Compared to CTA 12/22/2022. Extensive Aortoiliac calcified atherosclerosis. Extensive visceral calcified atherosclerosis. Celiac artery and SMA trunk remain patent. SMA atherosclerosis. Heavily calcified mesenteric vessels and multifocal mesenteric arterial stenosis such as on series 10, images 87, 84, and distally with a beaded appearance of the vessels that does not appear significantly changed since May. No proximal mesenteric arterial occlusion is identified. Similar renal artery and IMA atherosclerosis. Similar diffuse iliofemoral calcified atherosclerosis. No large artery  occlusion is identified. Negative for abdominal aortic aneurysm or dissection. On the delayed imaging phase the portal venous system appears to be patent. Review of the MIP images confirms the above findings. NON-VASCULAR Lower chest: No pericardial effusion. Mild cardiomegaly. Combined curvilinear and dependent lung base atelectasis. No convincing pleural effusion. Hepatobiliary: Vicarious contrast excretion to the gallbladder superimposed on gallstones seen yesterday. Negative liver. Pancreas: Negative. Spleen: Negative. Adrenals/Urinary Tract: Partial renal atrophy redemonstrated. Negative adrenal glands. Diminutive and unremarkable bladder. Stomach/Bowel: Fluid-filled stomach and proximal duodenum. Distal duodenum is tapered, decompressed. Proximal small bowel loops are decompressed in the left abdomen. Dilated and fluid-filled small  bowel persists in the mid and lower abdomen, not significantly changed from the CT yesterday. Mild rectal retained stool. Decompressed and thick walled appearing sigmoid colon. Descending colon is mostly decompressed with mild fluid. Decompressed and indistinct transverse colon. Mostly decompressed right colon. Terminal ileum also appears to remain decompressed. Appendix caudal to the cecum on series 14, image 68 is at the upper limits of normal but there is no convincing regional inflammation. Small bowel transition seems to occur in the distal ileum as yesterday. Abnormal appearance of the small bowel mesentery redemonstrated on coronal image 91, although correlating to the same area yesterday the air and fluid there seem to be intraluminal. The bowel wall detail is more limited on the arterial phase images today, but on the portal venous phase images this again appears to be intraluminal on series 7, image 55. No definite pneumoperitoneum. No portal venous gas identified. Lymphatic: No lymphadenopathy identified. Reproductive: Stable, within normal limits. Other: No pelvis free  fluid. 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 No acute osseous abnormality identified. Similar mild generalized body wall edema. IMPRESSION: VASCULAR 1. Diffuse severe calcified atherosclerosis. Extensive chronic mesenteric arterial calcified plaque and multifocal mesenteric artery stenoses. But no proximal mesenteric or other large artery occlusion is identified. Aortic Atherosclerosis (ICD10-I70.0). 2. No portal venous thrombosis. NON-VASCULAR 1. Distal small bowel obstruction not significantly changed from yesterday. No perforation or abscess identified. The large bowel remains decompressed and also appears intermittently inflamed, especially the sigmoid colon. 2. Other chronic findings including partial renal atrophy, cholelithiasis, renal osteodystrophy. Electronically Signed   By: Odessa Fleming M.D.   On: 05/02/2023 07:47   DG Abd 1 View  Result Date: 05/01/2023 CLINICAL DATA:  Follow-up small bowel obstruction. EXAM: ABDOMEN - 1 VIEW COMPARISON:  Abdomen and pelvis CT dated 04/30/2023. FINDINGS: Paucity of intestinal gas with no dilated bowel loops visualized. Diffuse arterial calcifications. Stable mild levoconvex lumbar scoliosis. IMPRESSION: Paucity of intestinal gas with no dilated bowel loops visualized. Electronically Signed   By: Beckie Salts M.D.   On: 05/01/2023 11:02        Scheduled Meds:  heparin  5,000 Units Subcutaneous Q8H   insulin aspart  0-9 Units Subcutaneous Q4H   insulin glargine-yfgn  5 Units Subcutaneous QHS   pantoprazole (PROTONIX) IV  40 mg Intravenous QHS   Continuous Infusions:  anticoagulant sodium citrate     ciprofloxacin 400 mg (04/30/23 1832)   dextrose 45 mL/hr at 05/02/23 0700   metronidazole Stopped (05/02/23 0326)     LOS: 2 days    Time spent: 35 minutes    Makaylynn Bonillas A Charlene Detter, MD [facility]   If 7PM-7AM, please contact night-coverage [email or web address]  05/02/2023, 8:40  AM

## 2023-05-02 NOTE — Progress Notes (Signed)
Pt c/o left arm pain. PIV currently infusing dextrose 5% without complication. Pt intermittently yells out, "don't you know my arm hurts". Hartley Barefoot, MD aware. IV team consulted. Warm compress applied for comfort.

## 2023-05-03 ENCOUNTER — Inpatient Hospital Stay (HOSPITAL_COMMUNITY): Payer: Medicare Other

## 2023-05-03 DIAGNOSIS — R509 Fever, unspecified: Secondary | ICD-10-CM | POA: Diagnosis not present

## 2023-05-03 DIAGNOSIS — K56609 Unspecified intestinal obstruction, unspecified as to partial versus complete obstruction: Secondary | ICD-10-CM | POA: Diagnosis not present

## 2023-05-03 LAB — CBC WITH DIFFERENTIAL/PLATELET
Abs Immature Granulocytes: 0.19 10*3/uL — ABNORMAL HIGH (ref 0.00–0.07)
Abs Immature Granulocytes: 0.38 10*3/uL — ABNORMAL HIGH (ref 0.00–0.07)
Basophils Absolute: 0 10*3/uL (ref 0.0–0.1)
Basophils Absolute: 0.1 10*3/uL (ref 0.0–0.1)
Basophils Relative: 0 %
Basophils Relative: 0 %
Eosinophils Absolute: 0 10*3/uL (ref 0.0–0.5)
Eosinophils Absolute: 0.1 10*3/uL (ref 0.0–0.5)
Eosinophils Relative: 0 %
Eosinophils Relative: 0 %
HCT: 36.2 % (ref 36.0–46.0)
HCT: 29.4 % — ABNORMAL LOW (ref 36.0–46.0)
Hemoglobin: 11.4 g/dL — ABNORMAL LOW (ref 12.0–15.0)
Hemoglobin: 9.5 g/dL — ABNORMAL LOW (ref 12.0–15.0)
Immature Granulocytes: 2 %
Immature Granulocytes: 3 %
Lymphocytes Relative: 10 %
Lymphocytes Relative: 11 %
Lymphs Abs: 0.9 10*3/uL (ref 0.7–4.0)
Lymphs Abs: 1.5 10*3/uL (ref 0.7–4.0)
MCH: 25.2 pg — ABNORMAL LOW (ref 26.0–34.0)
MCH: 26.5 pg (ref 26.0–34.0)
MCHC: 31.5 g/dL (ref 30.0–36.0)
MCHC: 32.3 g/dL (ref 30.0–36.0)
MCV: 80.1 fL (ref 80.0–100.0)
MCV: 82.1 fL (ref 80.0–100.0)
Monocytes Absolute: 0.5 10*3/uL (ref 0.1–1.0)
Monocytes Absolute: 0.4 10*3/uL (ref 0.1–1.0)
Monocytes Relative: 6 %
Monocytes Relative: 3 %
Neutro Abs: 7.6 10*3/uL (ref 1.7–7.7)
Neutro Abs: 11.8 10*3/uL — ABNORMAL HIGH (ref 1.7–7.7)
Neutrophils Relative %: 82 %
Neutrophils Relative %: 83 %
Platelets: 161 10*3/uL (ref 150–400)
Platelets: 232 10*3/uL (ref 150–400)
RBC: 4.52 MIL/uL (ref 3.87–5.11)
RBC: 3.58 MIL/uL — ABNORMAL LOW (ref 3.87–5.11)
RDW: 18.6 % — ABNORMAL HIGH (ref 11.5–15.5)
RDW: 18.5 % — ABNORMAL HIGH (ref 11.5–15.5)
Smear Review: NORMAL
Smear Review: NORMAL
WBC: 9.3 10*3/uL (ref 4.0–10.5)
WBC: 14.3 10*3/uL — ABNORMAL HIGH (ref 4.0–10.5)
nRBC: 0.4 % — ABNORMAL HIGH (ref 0.0–0.2)
nRBC: 0 % (ref 0.0–0.2)

## 2023-05-03 LAB — BASIC METABOLIC PANEL
Anion gap: 18 — ABNORMAL HIGH (ref 5–15)
BUN: 45 mg/dL — ABNORMAL HIGH (ref 6–20)
CO2: 23 mmol/L (ref 22–32)
Calcium: 8.9 mg/dL (ref 8.9–10.3)
Chloride: 89 mmol/L — ABNORMAL LOW (ref 98–111)
Creatinine, Ser: 5.49 mg/dL — ABNORMAL HIGH (ref 0.44–1.00)
GFR, Estimated: 9 mL/min — ABNORMAL LOW (ref >60)
Glucose, Bld: 104 mg/dL — ABNORMAL HIGH (ref 70–99)
Potassium: 4.6 mmol/L (ref 3.5–5.1)
Sodium: 130 mmol/L — ABNORMAL LOW (ref 135–145)

## 2023-05-03 LAB — GLUCOSE, CAPILLARY
Glucose-Capillary: 101 mg/dL — ABNORMAL HIGH (ref 70–99)
Glucose-Capillary: 143 mg/dL — ABNORMAL HIGH (ref 70–99)
Glucose-Capillary: 80 mg/dL (ref 70–99)
Glucose-Capillary: 90 mg/dL (ref 70–99)
Glucose-Capillary: 99 mg/dL (ref 70–99)

## 2023-05-03 MED ORDER — DIPHENHYDRAMINE HCL 50 MG/ML IJ SOLN
12.5000 mg | Freq: Once | INTRAMUSCULAR | Status: AC
  Administered 2023-05-03: 12.5 mg via INTRAVENOUS
  Filled 2023-05-03: qty 1

## 2023-05-03 MED ORDER — IBUPROFEN 100 MG/5ML PO SUSP
400.0000 mg | Freq: Once | ORAL | Status: AC
  Administered 2023-05-04: 400 mg via ORAL
  Filled 2023-05-03: qty 20

## 2023-05-03 NOTE — Progress Notes (Signed)
[facility] Progress Note   Subjective: Refused HD earlier today but now says she will go to HD. Says pain improved, drinking clear liquids.     Objective Vitals:   05/03/23 0447 05/03/23 0721 05/03/23 0727 05/03/23 1106  BP:  137/74  109/68  Pulse:  (!) 111 (!) 110 (!) 110  Resp:  19 19 15   Temp:  (!) 100.6 F (38.1 C) (!) 101.6 F (38.7 C) (!) 101.7 F (38.7 C)  TempSrc:  Axillary Oral Oral  SpO2:  98% 97% 94%  Weight: 52.5 kg      Physical Exam General: chronically ill appearing female in NAD Heart: RRR Lungs: CTAB Abdomen: soft, tender, very hypoactive BS.  Extremities: Bilateral BKA Dialysis Access: R AVF + T/B   Additional Objective Labs: Basic Metabolic Panel: Recent Labs  Lab 04/30/23 0447 05/01/23 1020 05/02/23 1041  NA 138 141 134*  K 4.8 6.2* 4.2  CL 94* 98 92*  CO2 26 25 27   GLUCOSE 228* 83 149*  BUN 46* 72* 32*  CREATININE 5.74* 7.57* 4.13*  CALCIUM 9.0 8.3* 8.1*   Liver Function Tests: Recent Labs  Lab 04/30/23 0447 05/01/23 1020  AST 21 26  ALT 16 18  ALKPHOS 92 72  BILITOT 0.7 0.9  PROT 6.9 6.2*  ALBUMIN 2.5* 2.1*   No results for input(s): "LIPASE", "AMYLASE" in the last 168 hours. CBC: Recent Labs  Lab 04/30/23 0447 05/01/23 1020 05/02/23 1041  WBC 9.7 10.1 13.1*  NEUTROABS 7.9*  --   --   HGB 10.1* 8.4* 8.4*  HCT 35.0* 27.6* 26.9*  MCV 89.1 83.9 84.3  PLT 199 219 240   Blood Culture    Component Value Date/Time   SDES BLOOD LEFT ARM 04/30/2023 0356   SDES BLOOD RIGHT ARM 04/30/2023 0356   SPECREQUEST  04/30/2023 0356    BOTTLES DRAWN AEROBIC AND ANAEROBIC Blood Culture results may not be optimal due to an excessive volume of blood received in culture bottles   SPECREQUEST  04/30/2023 0356    BOTTLES DRAWN AEROBIC AND ANAEROBIC Blood Culture adequate volume   CULT  04/30/2023 0356    NO GROWTH 3 DAYS Performed at [facility], [address], [place], [place] [address]    CULT  04/30/2023 0356     NO GROWTH 3 DAYS Performed at [facility], [address] [place], [place] [address]    REPTSTATUS PENDING 04/30/2023 0356   REPTSTATUS PENDING 04/30/2023 0356    Cardiac Enzymes: No results for input(s): "CKTOTAL", "CKMB", "CKMBINDEX", "TROPONINI" in the last 168 hours. CBG: Recent Labs  Lab 05/02/23 2024 05/02/23 2338 05/03/23 0429 05/03/23 0723 05/03/23 1108  GLUCAP 99 116* 143* 80 90   Iron Studies: No results for input(s): "IRON", "TIBC", "TRANSFERRIN", "FERRITIN" in the last 72 hours. @lablastinr3 @ Studies/Results: CT Angio Abd/Pel w/ and/or w/o  Result Date: 05/02/2023 CLINICAL DATA:  42 year old female with abdominal pain, diarrhea, low-grade fever, abnormal bowel on CT Abdomen and Pelvis 2 days ago. History of End stage renal disease, on dialysis, mesenteric artery stenosis. EXAM: CTA ABDOMEN AND PELVIS WITHOUT AND WITH CONTRAST TECHNIQUE: Multidetector CT imaging of the abdomen and pelvis was performed using the standard protocol during bolus administration of intravenous contrast. Multiplanar reconstructed images and MIPs were obtained and reviewed to evaluate the vascular anatomy. RADIATION DOSE REDUCTION: This exam was performed according to the departmental dose-optimization program which includes automated exposure control, adjustment of the mA and/or kV according to patient size  and/or use of iterative reconstruction technique. CONTRAST:  75mL OMNIPAQUE IOHEXOL 350 MG/ML SOLN COMPARISON:  CT Abdomen and Pelvis 04/30/2023. CTA abdomen and pelvis 12/22/2022. FINDINGS: VASCULAR Compared to CTA 12/22/2022. Extensive Aortoiliac calcified atherosclerosis. Extensive visceral calcified atherosclerosis. Celiac artery and SMA trunk remain patent. SMA atherosclerosis. Heavily calcified mesenteric vessels and multifocal mesenteric arterial stenosis such as on series 10, images 87, 84, and distally with a beaded appearance of the vessels that does not appear significantly  changed since May. No proximal mesenteric arterial occlusion is identified. Similar renal artery and IMA atherosclerosis. Similar diffuse iliofemoral calcified atherosclerosis. No large artery occlusion is identified. Negative for abdominal aortic aneurysm or dissection. On the delayed imaging phase the portal venous system appears to be patent. Review of the MIP images confirms the above findings. NON-VASCULAR Lower chest: No pericardial effusion. Mild cardiomegaly. Combined curvilinear and dependent lung base atelectasis. No convincing pleural effusion. Hepatobiliary: Vicarious contrast excretion to the gallbladder superimposed on gallstones seen yesterday. Negative liver. Pancreas: Negative. Spleen: Negative. Adrenals/Urinary Tract: Partial renal atrophy redemonstrated. Negative adrenal glands. Diminutive and unremarkable bladder. Stomach/Bowel: Fluid-filled stomach and proximal duodenum. Distal duodenum is tapered, decompressed. Proximal small bowel loops are decompressed in the left abdomen. Dilated and fluid-filled small bowel persists in the mid and lower abdomen, not significantly changed from the CT yesterday. Mild rectal retained stool. Decompressed and thick walled appearing sigmoid colon. Descending colon is mostly decompressed with mild fluid. Decompressed and indistinct transverse colon. Mostly decompressed right colon. Terminal ileum also appears to remain decompressed. Appendix caudal to the cecum on series 14, image 68 is at the upper limits of normal but there is no convincing regional inflammation. Small bowel transition seems to occur in the distal ileum as yesterday. Abnormal appearance of the small bowel mesentery redemonstrated on coronal image 91, although correlating to the same area yesterday the air and fluid there seem to be intraluminal. The bowel wall detail is more limited on the arterial phase images today, but on the portal venous phase images this again appears to be intraluminal on  series 7, image 55. No definite pneumoperitoneum. No portal venous gas identified. Lymphatic: No lymphadenopathy identified. Reproductive: Stable, within normal limits. Other: No pelvis free fluid. 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 No acute osseous abnormality identified. Similar mild generalized body wall edema. IMPRESSION: VASCULAR 1. Diffuse severe calcified atherosclerosis. Extensive chronic mesenteric arterial calcified plaque and multifocal mesenteric artery stenoses. But no proximal mesenteric or other large artery occlusion is identified. Aortic Atherosclerosis (ICD10-I70.0). 2. No portal venous thrombosis. NON-VASCULAR 1. Distal small bowel obstruction not significantly changed from yesterday. No perforation or abscess identified. The large bowel remains decompressed and also appears intermittently inflamed, especially the sigmoid colon. 2. Other chronic findings including partial renal atrophy, cholelithiasis, renal osteodystrophy. Electronically Signed   By: Odessa Fleming M.D.   On: 05/02/2023 07:47   Medications:  anticoagulant sodium citrate     dextrose 45 mL/hr at 05/03/23 0326    Chlorhexidine Gluconate Cloth  6 each Topical Q0600   heparin  5,000 Units Subcutaneous Q8H   insulin aspart  0-9 Units Subcutaneous Q4H   insulin glargine-yfgn  5 Units Subcutaneous Daily   pantoprazole (PROTONIX) IV  40 mg Intravenous QHS     Dialysis Orders: [facility] MWF  3h  400/1.5  49kg   R AVF   -Heparin none - last OP HD 9/16, post wt 51kg, coming off 2- 4kg over the last 3 wks -  mircera IV q 2 wks, last 9/13, due 9/27 - hectorol 4 mcg IV three times per week   Assessment/Plan: Abdominal pain - partial SBO per CT scan. CXR neg. Gen surg consulting. Colitis - per CT as well. GI panel ordered and cipro/ flagyl.  DMT1 - per pmd ESRD - on HD MWF. Next HD 9/20  Hyperkalemia. K 6.2 01/29/2023. K+ 4.2 05/02/2023. Follow  labs.  Volume - no vol excess on exam.  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  Follow.  MBD ckd - CCa in range, add on phos. Cont IV vdra. Resume sensipar when eating.  PAD - bilat AKA  Comfort Iversen H. Henryetta Corriveau NP-C 05/03/2023, 1:25 PM  [employer] [phone]

## 2023-05-03 NOTE — Progress Notes (Signed)
       Subjective: Reports ongoing abdominal pain. Febrile to 38.7 this morning. Labs pending today. Lactate normalized to 1.9 yesterday evening.   Objective: Vital signs in last 24 hours: Temp:  [98.7 F (37.1 C)-101.6 F (38.7 C)] 101.6 F (38.7 C) (09/20 0727) Pulse Rate:  [96-111] 110 (09/20 0727) Resp:  [12-19] 19 (09/20 0727) BP: (85-137)/(54-74) 137/74 (09/20 0721) SpO2:  [93 %-98 %] 97 % (09/20 0727) Weight:  [52.5 kg] 52.5 kg (09/20 0447) Last BM Date : 05/02/23  Intake/Output from previous day: 09/19 0701 - 09/20 0700 In: 917.7 [I.V.:917.7] Out: -  Intake/Output this shift: No intake/output data recorded.  PE: General: alert Neuro: alert and oriented, no focal deficits Resp: normal work of breathing on room air Abdomen: soft, nondistended, tender. Exam very limited as patient did not allow me to palpate her abdomen.   Lab Results:  Recent Labs    05/01/23 1020 05/02/23 1041  WBC 10.1 13.1*  HGB 8.4* 8.4*  HCT 27.6* 26.9*  PLT 219 240   BMET Recent Labs    05/01/23 1020 05/02/23 1041  NA 141 134*  K 6.2* 4.2  CL 98 92*  CO2 25 27  GLUCOSE 83 149*  BUN 72* 32*  CREATININE 7.57* 4.13*  CALCIUM 8.3* 8.1*   PT/INR No results for input(s): "LABPROT", "INR" in the last 72 hours.  CMP     Component Value Date/Time   NA 134 (L) 05/02/2023 1041   NA 133 (L) 05/18/2020 0927   K 4.2 05/02/2023 1041   CL 92 (L) 05/02/2023 1041   CO2 27 05/02/2023 1041   GLUCOSE 149 (H) 05/02/2023 1041   BUN 32 (H) 05/02/2023 1041   BUN 45 (H) 05/18/2020 0927   CREATININE 4.13 (H) 05/02/2023 1041   CALCIUM 8.1 (L) 05/02/2023 1041   CALCIUM 12.1 (H) 11/13/2021 2026   PROT 6.2 (L) 05/01/2023 1020   PROT 7.6 05/18/2020 0927   ALBUMIN 2.1 (L) 05/01/2023 1020   ALBUMIN 3.9 05/18/2020 0927   AST 26 05/01/2023 1020   ALT 18 05/01/2023 1020   ALKPHOS 72 05/01/2023 1020   BILITOT 0.9 05/01/2023 1020   BILITOT <0.2 05/18/2020 0927   GFRNONAA 13 (L) 05/02/2023 1041    GFRAA 6 (L) 05/18/2020 0927   Lipase     Component Value Date/Time   LIPASE 40 12/20/2022 0918     Assessment/Plan 42 yo female presenting with abdominal pain, diarrhea, low grade fevers, dilated small bowel and colonic wall thickening on CT. Lactate has been intermittently mildly elevated. Repeat CTA two days ago did not show pneumatosis or large vessel occlusion. Patient has continued to have pain and fevers and declined diagnostic laparoscopy yesterday. I offered laparoscopy again to today to evaluate for bowel ischemia and other potential causes of her pain, and she was more agreeable to this but declined to have surgery today. Will allow a trial of clear liquids today, but do not advance any further. Continue serial abdominal exams.    LOS: 3 days    Tracey Simas, MD [facility] General, Hepatobiliary and Pancreatic Surgery 05/03/23 8:03 AM

## 2023-05-03 NOTE — Progress Notes (Signed)
Transport went to pick up patient. Pt refused to come to HD. MD will be notified.

## 2023-05-03 NOTE — Progress Notes (Signed)
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  Patient was found to be febrile, tachycardic, lactic acid 1.9 followed by 2.5, chest x-ray atelectasis versus infiltrate.  CT abdomen and pelvis with contrast shows evidence of a small bowel obstruction with dilated ileal segment up to 4 cm, sigmoid segment consistent with colitis, diffuse gastric wall thickening, cholelithiasis with mild chronic prominence of the common bile duct.  General surgery consulted and following concern for bowel ischemia.      Assessment & Plan:   Principal Problem:   SBO (small bowel obstruction) (HCC)   1-Partial small bowel obstruction versus Ischemic Colitis versus Infectious colitis -Patient presented with abdominal pain, lactic acidosis, CT abdomen and pelvis consistent with SBO and colitis -Appreciate general surgery recommendation plan to continue with n.p.o. status -Treated initially with  IV ciprofloxacin and Flagyl. Holding antibiotics per ID recommendations. High risk for c  diff activation.  -C diff PCR negative, c diff antigen positive. ID consulted.  -CT abdomen and pelvis angio negative for complete occlusion.  -ID consulted. Spiking fevers, leukocytosis.  Continue to have pain, decline laparoscopy. Plan to proceed with KUB>   2-Diabetes type 1: CBG 74.  She is currently NPO.  Stated low rate D5.  Semglee 5  mg   daily Monitor CBG B-met no acidosis gap normal.   3-peripheral vascular disease status post bilateral BKA -Continue to hold statins while NPO  4-ESRD on hemodialysis Monday Wednesday and Friday, hyperkalemia Received HD 9/18. Due for HD today.   5-Essential Hypertension: Continue to hold blood pressure medications  Hyperlipidemia: Continue to hold statins Chronic anemia anemia of chronic disease: Continue to monitor hemoglobin   See wound care documentation below.   Pressure Injury 04/30/23 Sacrum Mid Stage 2 -  Partial thickness loss of dermis presenting as a shallow open injury with a red, pink wound bed without slough. 5 cm x 7 cm pink dry (Active)  04/30/23   Location: Sacrum  Location Orientation: Mid  Staging: Stage 2 -  Partial thickness loss of dermis presenting as a shallow open injury with a red, pink wound bed without slough.  Wound Description (Comments): 5 cm x 7 cm pink dry  Present on Admission: Yes  Dressing Type Foam - Lift dressing to assess site every shift 05/02/23 1943     Estimated body mass index is 21.87 kg/m as calculated from the following:   Height as of 03/17/23: 5\' 1"  (1.549 m).   Weight as of this encounter: 52.5 kg.   DVT prophylaxis: heparin  Code Status: full code Family Communication: SW to contact family.  Disposition Plan:  Status is: Inpatient Remains inpatient appropriate because: management of abdominal pain     Consultants:  Surgery Nephrology   Procedures:  HD  Antimicrobials:    Subjective: She is alert, report having still abdominal pain, she think pain is a little better than yesterday,   Objective: Vitals:   05/03/23 0000 05/03/23 0200 05/03/23 0430 05/03/23 0447  BP: 104/65 110/65  101/61   Pulse: (!) 109 (!) 104 (!) 107   Resp: 17 17 14    Temp: 98.9 F (37.2 C) 99 F (37.2 C) 99.8 F (37.7 C)   TempSrc: Oral Oral Oral   SpO2: 93% 94% 96%   Weight:    52.5 kg    Intake/Output Summary (Last 24  hours) at 05/03/2023 0714 Last data filed at 05/03/2023 0326 Gross per 24 hour  Intake 917.73 ml  Output --  Net 917.73 ml   Filed Weights   05/01/23 1832 05/02/23 0430 05/03/23 0447  Weight: 51.4 kg 51.3 kg 52.5 kg    Examination:  General exam: NAD Respiratory system: CTA Cardiovascular system: S 1, S 2 RRR Gastrointestinal system: She didn't allow me to perform abdominal exam.  Central nervous system: Alert    Data Reviewed: I have personally reviewed following labs and imaging studies  CBC: Recent Labs  Lab 04/30/23 0447 05/01/23 1020 05/02/23 1041  WBC 9.7 10.1 13.1*  NEUTROABS 7.9*  --   --   HGB 10.1* 8.4* 8.4*  HCT 35.0* 27.6* 26.9*  MCV 89.1 83.9 84.3  PLT 199 219 240   Basic Metabolic Panel: Recent Labs  Lab 04/30/23 0447 05/01/23 1020 05/02/23 1041  NA 138 141 134*  K 4.8 6.2* 4.2  CL 94* 98 92*  CO2 26 25 27   GLUCOSE 228* 83 149*  BUN 46* 72* 32*  CREATININE 5.74* 7.57* 4.13*  CALCIUM 9.0 8.3* 8.1*   GFR: Estimated Creatinine Clearance: 13.5 mL/min (A) (by C-G formula based on SCr of 4.13 mg/dL (H)). Liver Function Tests: Recent Labs  Lab 04/30/23 0447 05/01/23 1020  AST 21 26  ALT 16 18  ALKPHOS 92 72  BILITOT 0.7 0.9  PROT 6.9 6.2*  ALBUMIN 2.5* 2.1*   No results for input(s): "LIPASE", "AMYLASE" in the last 168 hours. No results for input(s): "AMMONIA" in the last 168 hours. Coagulation Profile: Recent Labs  Lab 04/30/23 0447  INR 1.3*   Cardiac Enzymes: No results for input(s): "CKTOTAL", "CKMB", "CKMBINDEX", "TROPONINI" in the last 168 hours. BNP (last 3 results) No results for input(s): "PROBNP" in the last 8760 hours. HbA1C: Recent Labs    04/30/23 0930  HGBA1C 5.7*   CBG: Recent Labs  Lab 05/02/23 1120 05/02/23 1622 05/02/23 2024 05/02/23 2338 05/03/23 0429  GLUCAP 121* 115* 99 116* 143*   Lipid Profile: No results for input(s): "CHOL", "HDL", "LDLCALC", "TRIG", "CHOLHDL", "LDLDIRECT" in the last 72  hours. Thyroid Function Tests: No results for input(s): "TSH", "T4TOTAL", "FREET4", "T3FREE", "THYROIDAB" in the last 72 hours. Anemia Panel: No results for input(s): "VITAMINB12", "FOLATE", "FERRITIN", "TIBC", "IRON", "RETICCTPCT" in the last 72 hours. Sepsis Labs: Recent Labs  Lab 04/30/23 1417 05/01/23 1020 05/02/23 1041 05/02/23 1614  PROCALCITON  --  32.25  --   --   LATICACIDVEN 1.7 2.3* 2.2* 1.9    Recent Results (from the past 240 hour(s))  Blood Culture (routine x 2)     Status: None (Preliminary result)   Collection Time: 04/30/23  3:56 AM   Specimen: BLOOD LEFT ARM  Result Value Ref Range Status   Specimen Description BLOOD LEFT ARM  Final   Special Requests   Final    BOTTLES DRAWN AEROBIC AND ANAEROBIC Blood Culture results may not be optimal due to an excessive volume of blood received in culture bottles   Culture   Final    NO GROWTH 2 DAYS Performed at [facility], [address] [place], [place] [address]    Report Status PENDING  Incomplete  Blood Culture (routine x 2)     Status: None (Preliminary result)   Collection Time: 04/30/23  3:56 AM   Specimen: BLOOD RIGHT ARM  Result Value Ref Range Status   Specimen Description BLOOD RIGHT ARM  Final   Special Requests   Final    BOTTLES DRAWN AEROBIC AND ANAEROBIC Blood Culture adequate volume   Culture   Final    NO GROWTH 2 DAYS Performed at [facility], [address], [place], [place] [address]    Report Status PENDING  Incomplete  SARS Coronavirus 2 by RT PCR (hospital order, performed in [facility] hospital lab) *cepheid single result test* Anterior Nasal Swab     Status: None   Collection Time: 04/30/23  6:50 AM   Specimen: Anterior Nasal Swab  Result Value Ref Range Status   SARS Coronavirus 2 by RT PCR NEGATIVE NEGATIVE Final    Comment: Performed at [facility], [address] [place], [place] [address]  Gastrointestinal Panel by PCR , Stool     Status: None   Collection  Time: 05/02/23 12:06 AM   Specimen: Stool  Result Value Ref Range Status   Campylobacter species NOT DETECTED NOT DETECTED Final   Plesimonas shigelloides NOT DETECTED NOT DETECTED Final   Salmonella species NOT DETECTED NOT DETECTED Final   Yersinia enterocolitica NOT DETECTED NOT DETECTED Final   Vibrio species NOT DETECTED NOT DETECTED Final   Vibrio cholerae NOT DETECTED NOT DETECTED Final   Enteroaggregative E coli (EAEC) NOT DETECTED NOT DETECTED Final   Enteropathogenic E coli (EPEC) NOT DETECTED NOT DETECTED Final   Enterotoxigenic E coli (ETEC) NOT DETECTED NOT DETECTED Final   Shiga like toxin producing E coli (STEC) NOT DETECTED NOT DETECTED Final   Shigella/Enteroinvasive E coli (EIEC) NOT DETECTED NOT DETECTED Final   Cryptosporidium NOT DETECTED NOT DETECTED Final   Cyclospora cayetanensis NOT DETECTED NOT DETECTED Final   Entamoeba histolytica NOT DETECTED NOT DETECTED Final   Giardia lamblia NOT DETECTED NOT DETECTED Final   Adenovirus F40/41 NOT DETECTED NOT DETECTED Final   Astrovirus NOT DETECTED NOT DETECTED Final   Norovirus GI/GII NOT DETECTED NOT DETECTED Final   Rotavirus A NOT DETECTED NOT DETECTED Final   Sapovirus (I, II, IV, and V) NOT DETECTED NOT DETECTED Final    Comment: Performed at [facility], [address] [place], [place] [address]  C Difficile Quick Screen w PCR reflex     Status: Abnormal   Collection Time: 05/02/23 12:06 AM   Specimen: STOOL  Result Value Ref Range Status   C Diff antigen POSITIVE (A) NEGATIVE Final   C Diff toxin NEGATIVE NEGATIVE Final   C Diff interpretation Results are indeterminate. See PCR results.  Final    Comment: Performed at [facility], [address], [place], [place] [address]  C. Diff by PCR, Reflexed     Status: None   Collection Time: 05/02/23 12:06 AM  Result Value Ref Range Status   Toxigenic C. Difficile by PCR NEGATIVE NEGATIVE Final    Comment: Patient is colonized with non toxigenic  C. difficile. May not need treatment unless significant symptoms are present. Performed at [facility], [address], [place], [place] [address]          Radiology Studies: CT Angio Abd/Pel w/ and/or w/o  Result Date: 05/02/2023 CLINICAL DATA:  42 year old female  with abdominal pain, diarrhea, low-grade fever, abnormal bowel on CT Abdomen and Pelvis 2 days ago. History of End stage renal disease, on dialysis, mesenteric artery stenosis. EXAM: CTA ABDOMEN AND PELVIS WITHOUT AND WITH CONTRAST TECHNIQUE: Multidetector CT imaging of the abdomen and pelvis was performed using the standard protocol during bolus administration of intravenous contrast. Multiplanar reconstructed images and MIPs were obtained and reviewed to evaluate the vascular anatomy. RADIATION DOSE REDUCTION: This exam was performed according to the departmental dose-optimization program which includes automated exposure control, adjustment of the mA and/or kV according to patient size and/or use of iterative reconstruction technique. CONTRAST:  75mL OMNIPAQUE IOHEXOL 350 MG/ML SOLN COMPARISON:  CT Abdomen and Pelvis 04/30/2023. CTA abdomen and pelvis 12/22/2022. FINDINGS: VASCULAR Compared to CTA 12/22/2022. Extensive Aortoiliac calcified atherosclerosis. Extensive visceral calcified atherosclerosis. Celiac artery and SMA trunk remain patent. SMA atherosclerosis. Heavily calcified mesenteric vessels and multifocal mesenteric arterial stenosis such as on series 10, images 87, 84, and distally with a beaded appearance of the vessels that does not appear significantly changed since May. No proximal mesenteric arterial occlusion is identified. Similar renal artery and IMA atherosclerosis. Similar diffuse iliofemoral calcified atherosclerosis. No large artery occlusion is identified. Negative for abdominal aortic aneurysm or dissection. On the delayed imaging phase the portal venous system appears to be patent. Review of the MIP images  confirms the above findings. NON-VASCULAR Lower chest: No pericardial effusion. Mild cardiomegaly. Combined curvilinear and dependent lung base atelectasis. No convincing pleural effusion. Hepatobiliary: Vicarious contrast excretion to the gallbladder superimposed on gallstones seen yesterday. Negative liver. Pancreas: Negative. Spleen: Negative. Adrenals/Urinary Tract: Partial renal atrophy redemonstrated. Negative adrenal glands. Diminutive and unremarkable bladder. Stomach/Bowel: Fluid-filled stomach and proximal duodenum. Distal duodenum is tapered, decompressed. Proximal small bowel loops are decompressed in the left abdomen. Dilated and fluid-filled small bowel persists in the mid and lower abdomen, not significantly changed from the CT yesterday. Mild rectal retained stool. Decompressed and thick walled appearing sigmoid colon. Descending colon is mostly decompressed with mild fluid. Decompressed and indistinct transverse colon. Mostly decompressed right colon. Terminal ileum also appears to remain decompressed. Appendix caudal to the cecum on series 14, image 68 is at the upper limits of normal but there is no convincing regional inflammation. Small bowel transition seems to occur in the distal ileum as yesterday. Abnormal appearance of the small bowel mesentery redemonstrated on coronal image 91, although correlating to the same area yesterday the air and fluid there seem to be intraluminal. The bowel wall detail is more limited on the arterial phase images today, but on the portal venous phase images this again appears to be intraluminal on series 7, image 55. No definite pneumoperitoneum. No portal venous gas identified. Lymphatic: No lymphadenopathy identified. Reproductive: Stable, within normal limits. Other: No pelvis free fluid. 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 No acute osseous abnormality identified. Similar mild generalized  body wall edema. IMPRESSION: VASCULAR 1. Diffuse severe calcified atherosclerosis. Extensive chronic mesenteric arterial calcified plaque and multifocal mesenteric artery stenoses. But no proximal mesenteric or other large artery occlusion is identified. Aortic Atherosclerosis (ICD10-I70.0). 2. No portal venous thrombosis. NON-VASCULAR 1. Distal small bowel obstruction not significantly changed from yesterday. No perforation or abscess identified. The large bowel remains decompressed and also appears intermittently inflamed, especially the sigmoid colon. 2. Other chronic findings including partial renal atrophy, cholelithiasis, renal osteodystrophy. Electronically Signed   By: Odessa Fleming M.D.   On: 05/02/2023 07:47  Scheduled Meds:  Chlorhexidine Gluconate Cloth  6 each Topical Q0600   heparin  5,000 Units Subcutaneous Q8H   insulin aspart  0-9 Units Subcutaneous Q4H   insulin glargine-yfgn  5 Units Subcutaneous Daily   pantoprazole (PROTONIX) IV  40 mg Intravenous QHS   Continuous Infusions:  anticoagulant sodium citrate     dextrose 45 mL/hr at 05/03/23 0326     LOS: 3 days    Time spent: 35 minutes    Aryannah Mohon A Duff Pozzi, MD [facility]   If 7PM-7AM, please contact night-coverage [email or web address]  05/03/2023, 7:14 AM

## 2023-05-03 NOTE — Plan of Care (Signed)
  Problem: Coping: Goal: Ability to adjust to condition or change in health will improve Outcome: Not Progressing   Problem: Metabolic: Goal: Ability to maintain appropriate glucose levels will improve Outcome: Progressing   Problem: Nutritional: Goal: Maintenance of adequate nutrition will improve Outcome: Not Progressing   Problem: Education: Goal: Knowledge of General Education information will improve Description: Including pain rating scale, medication(s)/side effects and non-pharmacologic comfort measures Outcome: Not Progressing   Problem: Health Behavior/Discharge Planning: Goal: Ability to manage health-related needs will improve Outcome: Not Progressing

## 2023-05-03 NOTE — Progress Notes (Signed)
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   Low suspicion for any preceding bacterial infection of consequence. 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   Imaging/hx concerning for above sx, and on imaging distal sbo also present   ----------- 9/20 assessment ?worsening pain. Getting quite frequent/round the clock dilaudid/vicodin  Fever also within last 24 hours   Discussed with primary and surgery team    PLAN: Bcx Low threshold to restart abx (ceftriaxone/flagyl) in concern for potential peritonitis from gut ischemia/bowel obstruction Kub will be ordered by primary team    Principal Problem:   SBO (small bowel obstruction) (HCC)   Allergies  Allergen Reactions   Peanut-Containing Drug Products Anaphylaxis and Hives   Penicillins Rash and Other (See Comments)    Rash in 2008.  Tolerated cefazolin in 2020. No reaction listed on MAR.   Shellfish Allergy Swelling   Chocolate Hives    Scheduled Meds:  Chlorhexidine Gluconate Cloth  6 each Topical Q0600   heparin  5,000 Units Subcutaneous Q8H   insulin aspart  0-9 Units Subcutaneous Q4H   insulin glargine-yfgn  5 Units Subcutaneous Daily   pantoprazole (PROTONIX) IV  40 mg Intravenous QHS   Continuous Infusions:  anticoagulant sodium citrate     dextrose 45 mL/hr at 05/03/23 0326   PRN Meds:.acetaminophen **OR** acetaminophen, albuterol, alteplase, anticoagulant sodium citrate, bisacodyl, feeding supplement (NEPRO CARB STEADY), heparin, hydrALAZINE, HYDROcodone-acetaminophen, HYDROmorphone (DILAUDID) injection, lidocaine (PF), lidocaine-prilocaine, ondansetron **OR** ondansetron (ZOFRAN) IV,  pentafluoroprop-tetrafluoroeth   SUBJECTIVE: Fever early after midnight but no further Getting dilaudid round the clock on top of vicodin  Refusing diagnostic laparoscopy  Review of Systems: ROS All other ROS was negative, except mentioned above     OBJECTIVE: Vitals:   05/03/23 0447 05/03/23 0721 05/03/23 0727 05/03/23 1106  BP:  137/74  109/68  Pulse:  (!) 111 (!) 110 (!) 110  Resp:  19 19 15   Temp:  (!) 100.6 F (38.1 C) (!) 101.6 F (38.7 C) (!) 101.7 F (38.7 C)  TempSrc:  Axillary Oral Oral  SpO2:  98% 97% 94%  Weight: 52.5 kg      Body mass index is 21.87 kg/m.  Physical Exam General/constitutional: sleeping, arousable but drifting to sleep. Just got dilaudid. Said pain better now but still there, more focused in abd area rather than diffusely HEENT: Normocephalic; poor dentition CV: tachy; regular rhythm; no murmur Lungs: clear to auscultation, normal respiratory effort Abd: Soft, nondistended, but very tender and slight touch leads to guarding more so than yesterday    Lab Results Lab Results  Component Value Date   WBC 13.1 (H) 05/02/2023   HGB 8.4 (L) 05/02/2023   HCT 26.9 (L) 05/02/2023   MCV 84.3 05/02/2023   PLT 240 05/02/2023    Lab Results  Component Value Date   CREATININE 4.13 (H) 05/02/2023   BUN 32 (H) 05/02/2023   NA 134 (L) 05/02/2023   K 4.2 05/02/2023   CL 92 (L) 05/02/2023   CO2 27 05/02/2023    Lab Results  Component Value Date   ALT 18 05/01/2023   AST 26 05/01/2023   ALKPHOS 72 05/01/2023   BILITOT 0.9 05/01/2023  Microbiology: Recent Results (from the past 240 hour(s))  Blood Culture (routine x 2)     Status: None (Preliminary result)   Collection Time: 04/30/23  3:56 AM   Specimen: BLOOD LEFT ARM  Result Value Ref Range Status   Specimen Description BLOOD LEFT ARM  Final   Special Requests   Final    BOTTLES DRAWN AEROBIC AND ANAEROBIC Blood Culture results may not be optimal due to an excessive volume of  blood received in culture bottles   Culture   Final    NO GROWTH 3 DAYS Performed at [facility], [address], [place], [place] [address]    Report Status PENDING  Incomplete  Blood Culture (routine x 2)     Status: None (Preliminary result)   Collection Time: 04/30/23  3:56 AM   Specimen: BLOOD RIGHT ARM  Result Value Ref Range Status   Specimen Description BLOOD RIGHT ARM  Final   Special Requests   Final    BOTTLES DRAWN AEROBIC AND ANAEROBIC Blood Culture adequate volume   Culture   Final    NO GROWTH 3 DAYS Performed at [facility], [address] [place], [place] [address]    Report Status PENDING  Incomplete  SARS Coronavirus 2 by RT PCR (hospital order, performed in [facility] hospital lab) *cepheid single result test* Anterior Nasal Swab     Status: None   Collection Time: 04/30/23  6:50 AM   Specimen: Anterior Nasal Swab  Result Value Ref Range Status   SARS Coronavirus 2 by RT PCR NEGATIVE NEGATIVE Final    Comment: Performed at [facility], [address], [place], [place] [address]  Gastrointestinal Panel by PCR , Stool     Status: None   Collection Time: 05/02/23 12:06 AM   Specimen: Stool  Result Value Ref Range Status   Campylobacter species NOT DETECTED NOT DETECTED Final   Plesimonas shigelloides NOT DETECTED NOT DETECTED Final   Salmonella species NOT DETECTED NOT DETECTED Final   Yersinia enterocolitica NOT DETECTED NOT DETECTED Final   Vibrio species NOT DETECTED NOT DETECTED Final   Vibrio cholerae NOT DETECTED NOT DETECTED Final   Enteroaggregative E coli (EAEC) NOT DETECTED NOT DETECTED Final   Enteropathogenic E coli (EPEC) NOT DETECTED NOT DETECTED Final   Enterotoxigenic E coli (ETEC) NOT DETECTED NOT DETECTED Final   Shiga like toxin producing E coli (STEC) NOT DETECTED NOT DETECTED Final   Shigella/Enteroinvasive E coli (EIEC) NOT DETECTED NOT DETECTED Final   Cryptosporidium NOT DETECTED NOT DETECTED Final   Cyclospora  cayetanensis NOT DETECTED NOT DETECTED Final   Entamoeba histolytica NOT DETECTED NOT DETECTED Final   Giardia lamblia NOT DETECTED NOT DETECTED Final   Adenovirus F40/41 NOT DETECTED NOT DETECTED Final   Astrovirus NOT DETECTED NOT DETECTED Final   Norovirus GI/GII NOT DETECTED NOT DETECTED Final   Rotavirus A NOT DETECTED NOT DETECTED Final   Sapovirus (I, II, IV, and V) NOT DETECTED NOT DETECTED Final    Comment: Performed at [facility], [address] [place], [place] [address]  C Difficile Quick Screen w PCR reflex     Status: Abnormal   Collection Time: 05/02/23 12:06 AM   Specimen: STOOL  Result Value Ref Range Status   C Diff antigen POSITIVE (A) NEGATIVE Final   C Diff toxin NEGATIVE NEGATIVE Final   C Diff interpretation Results are indeterminate. See PCR results.  Final    Comment: Performed at  [facility], [address] [place], [place] [address]  C. Diff by PCR, Reflexed     Status: None   Collection Time: 05/02/23 12:06 AM  Result Value Ref Range Status   Toxigenic C. Difficile by PCR NEGATIVE NEGATIVE Final    Comment: Patient is colonized with non toxigenic C. difficile. May not need treatment unless significant symptoms are present. Performed at [facility], [address], [place], [place] [address]      Serology:   Imaging: If present, new imagings (plain films, ct scans, and mri) have been personally visualized and interpreted; radiology reports have been reviewed. Decision making incorporated into the Impression / Recommendations.  None today   Raymondo Band, MD [facility] [phone] pager    05/03/2023, 12:55 PM

## 2023-05-03 NOTE — Progress Notes (Signed)
   05/03/23 2009  Vitals  Temp 98.6 F (37 C)  Pulse Rate (!) 101  Resp 16  BP (!) 91/57  SpO2 98 %  O2 Device Room Air  Oxygen Therapy  Patient Activity (if Appropriate) In bed  Pulse Oximetry Type Continuous  Post Treatment  Dialyzer Clearance Clear  Hemodialysis Intake (mL) 0 mL  Liters Processed 75.7  Fluid Removed (mL) 500 mL  Tolerated HD Treatment Yes  Post-Hemodialysis Comments Pt did well at the beginning and mid treatment, until BP start to decrease and UF was turn off and UF goal was not met. Report call to [facility] Bedside RN, Earleen Newport.  AVG/AVF Arterial Site Held (minutes) 5 minutes  AVG/AVF Venous Site Held (minutes) 5 minutes   Received patient in bed to unit.  Alert and oriented.  Informed consent signed and in chart.   TX duration:3.30  Patient tolerated well.  Transported back to the room  Alert, without acute distress.  Hand-off given to patient's nurse.   Access used: Yes Access issues: No  Total UF removed: 500, UF goal 1L was not met D/T Decrease SBP Medication(s) given: See MAR Post HD VS: See Above Grid Post HD weight: 53. 2 kg   [facility]

## 2023-05-04 ENCOUNTER — Inpatient Hospital Stay (HOSPITAL_COMMUNITY): Payer: Medicare Other | Admitting: Certified Registered Nurse Anesthetist

## 2023-05-04 ENCOUNTER — Encounter (HOSPITAL_COMMUNITY)
Admission: EM | Disposition: A | Discharge status: Skilled Nursing Facility | Payer: Self-pay | Source: Skilled Nursing Facility | Attending: Internal Medicine

## 2023-05-04 ENCOUNTER — Other Ambulatory Visit: Payer: Self-pay

## 2023-05-04 DIAGNOSIS — I2510 Atherosclerotic heart disease of native coronary artery without angina pectoris: Secondary | ICD-10-CM

## 2023-05-04 DIAGNOSIS — N186 End stage renal disease: Secondary | ICD-10-CM

## 2023-05-04 DIAGNOSIS — I120 Hypertensive chronic kidney disease with stage 5 chronic kidney disease or end stage renal disease: Secondary | ICD-10-CM | POA: Diagnosis not present

## 2023-05-04 DIAGNOSIS — K56609 Unspecified intestinal obstruction, unspecified as to partial versus complete obstruction: Secondary | ICD-10-CM | POA: Diagnosis not present

## 2023-05-04 DIAGNOSIS — Z992 Dependence on renal dialysis: Secondary | ICD-10-CM

## 2023-05-04 HISTORY — PX: LAPAROSCOPY: SHX197

## 2023-05-04 HISTORY — PX: COLOSTOMY: SHX63

## 2023-05-04 HISTORY — PX: LAPAROTOMY: SHX154

## 2023-05-04 LAB — BASIC METABOLIC PANEL
Anion gap: 16 — ABNORMAL HIGH (ref 5–15)
BUN: 20 mg/dL (ref 6–20)
CO2: 24 mmol/L (ref 22–32)
Calcium: 8.7 mg/dL — ABNORMAL LOW (ref 8.9–10.3)
Chloride: 93 mmol/L — ABNORMAL LOW (ref 98–111)
Creatinine, Ser: 2.9 mg/dL — ABNORMAL HIGH (ref 0.44–1.00)
GFR, Estimated: 20 mL/min — ABNORMAL LOW (ref >60)
Glucose, Bld: 130 mg/dL — ABNORMAL HIGH (ref 70–99)
Potassium: 3.7 mmol/L (ref 3.5–5.1)
Sodium: 133 mmol/L — ABNORMAL LOW (ref 135–145)

## 2023-05-04 LAB — GLUCOSE, CAPILLARY
Glucose-Capillary: 111 mg/dL — ABNORMAL HIGH (ref 70–99)
Glucose-Capillary: 140 mg/dL — ABNORMAL HIGH (ref 70–99)
Glucose-Capillary: 141 mg/dL — ABNORMAL HIGH (ref 70–99)
Glucose-Capillary: 149 mg/dL — ABNORMAL HIGH (ref 70–99)
Glucose-Capillary: 187 mg/dL — ABNORMAL HIGH (ref 70–99)
Glucose-Capillary: 203 mg/dL — ABNORMAL HIGH (ref 70–99)
Glucose-Capillary: 175 mg/dL — ABNORMAL HIGH (ref 70–99)

## 2023-05-04 SURGERY — LAPAROSCOPY, DIAGNOSTIC
Anesthesia: General | Site: Abdomen | Laterality: Right

## 2023-05-04 MED ORDER — ROCURONIUM BROMIDE 10 MG/ML (PF) SYRINGE
PREFILLED_SYRINGE | INTRAVENOUS | Status: DC | PRN
  Administered 2023-05-04: 10 mg via INTRAVENOUS
  Administered 2023-05-04: 50 mg via INTRAVENOUS

## 2023-05-04 MED ORDER — DEXAMETHASONE SODIUM PHOSPHATE 10 MG/ML IJ SOLN
INTRAMUSCULAR | Status: DC | PRN
  Administered 2023-05-04: 5 mg via INTRAVENOUS

## 2023-05-04 MED ORDER — SODIUM CHLORIDE 0.9 % IV SOLN
2.0000 g | INTRAVENOUS | Status: AC
  Administered 2023-05-04 – 2023-05-08 (×5): 2 g via INTRAVENOUS
  Filled 2023-05-04 (×5): qty 20

## 2023-05-04 MED ORDER — SUGAMMADEX SODIUM 200 MG/2ML IV SOLN
INTRAVENOUS | Status: DC | PRN
  Administered 2023-05-04: 200 mg via INTRAVENOUS

## 2023-05-04 MED ORDER — BUPIVACAINE-EPINEPHRINE 0.25% -1:200000 IJ SOLN
INTRAMUSCULAR | Status: DC | PRN
Start: 2023-05-04 — End: 2023-05-04
  Administered 2023-05-04: 4 mL

## 2023-05-04 MED ORDER — FENTANYL CITRATE (PF) 250 MCG/5ML IJ SOLN
INTRAMUSCULAR | Status: AC
  Filled 2023-05-04: qty 5

## 2023-05-04 MED ORDER — PHENYLEPHRINE HCL-NACL 20-0.9 MG/250ML-% IV SOLN
INTRAVENOUS | Status: DC | PRN
  Administered 2023-05-04: 25 ug/min via INTRAVENOUS

## 2023-05-04 MED ORDER — PHENYLEPHRINE 80 MCG/ML (10ML) SYRINGE FOR IV PUSH (FOR BLOOD PRESSURE SUPPORT)
PREFILLED_SYRINGE | INTRAVENOUS | Status: DC | PRN
  Administered 2023-05-04: 80 ug via INTRAVENOUS

## 2023-05-04 MED ORDER — SUCCINYLCHOLINE CHLORIDE 200 MG/10ML IV SOSY
PREFILLED_SYRINGE | INTRAVENOUS | Status: DC | PRN
  Administered 2023-05-04: 120 mg via INTRAVENOUS

## 2023-05-04 MED ORDER — ONDANSETRON HCL 4 MG/2ML IJ SOLN
INTRAMUSCULAR | Status: AC
  Filled 2023-05-04: qty 2

## 2023-05-04 MED ORDER — BUPIVACAINE-EPINEPHRINE (PF) 0.25% -1:200000 IJ SOLN
INTRAMUSCULAR | Status: AC
  Filled 2023-05-04: qty 30

## 2023-05-04 MED ORDER — ALBUMIN HUMAN 5 % IV SOLN
INTRAVENOUS | Status: DC | PRN
Start: 2023-05-04 — End: 2023-05-04

## 2023-05-04 MED ORDER — FENTANYL CITRATE (PF) 250 MCG/5ML IJ SOLN
INTRAMUSCULAR | Status: DC | PRN
  Administered 2023-05-04: 50 ug via INTRAVENOUS
  Administered 2023-05-04 (×8): 25 ug via INTRAVENOUS

## 2023-05-04 MED ORDER — PROPOFOL 10 MG/ML IV BOLUS
INTRAVENOUS | Status: AC
  Filled 2023-05-04: qty 20

## 2023-05-04 MED ORDER — ONDANSETRON HCL 4 MG/2ML IJ SOLN
INTRAMUSCULAR | Status: DC | PRN
  Administered 2023-05-04: 4 mg via INTRAVENOUS

## 2023-05-04 MED ORDER — LIDOCAINE 2% (20 MG/ML) 5 ML SYRINGE
INTRAMUSCULAR | Status: DC | PRN
  Administered 2023-05-04: 40 mg via INTRAVENOUS

## 2023-05-04 MED ORDER — SODIUM CHLORIDE 0.9 % IV SOLN
INTRAVENOUS | Status: DC | PRN
Start: 2023-05-04 — End: 2023-05-04

## 2023-05-04 MED ORDER — SODIUM CHLORIDE 0.9 % IV BOLUS
500.0000 mL | Freq: Once | INTRAVENOUS | Status: AC
  Administered 2023-05-04: 500 mL via INTRAVENOUS

## 2023-05-04 MED ORDER — MIDAZOLAM HCL 2 MG/2ML IJ SOLN
INTRAMUSCULAR | Status: DC | PRN
  Administered 2023-05-04 (×2): 1 mg via INTRAVENOUS

## 2023-05-04 MED ORDER — 0.9 % SODIUM CHLORIDE (POUR BTL) OPTIME
TOPICAL | Status: DC | PRN
  Administered 2023-05-04: 4000 mL

## 2023-05-04 MED ORDER — METRONIDAZOLE 500 MG/100ML IV SOLN
500.0000 mg | Freq: Two times a day (BID) | INTRAVENOUS | Status: AC
  Administered 2023-05-04 – 2023-05-08 (×10): 500 mg via INTRAVENOUS
  Filled 2023-05-04 (×10): qty 100

## 2023-05-04 MED ORDER — DEXAMETHASONE SODIUM PHOSPHATE 10 MG/ML IJ SOLN
INTRAMUSCULAR | Status: AC
  Filled 2023-05-04: qty 1

## 2023-05-04 MED ORDER — MIDAZOLAM HCL 2 MG/2ML IJ SOLN
INTRAMUSCULAR | Status: AC
  Filled 2023-05-04: qty 2

## 2023-05-04 MED ORDER — PROPOFOL 10 MG/ML IV BOLUS
INTRAVENOUS | Status: DC | PRN
  Administered 2023-05-04: 70 mg via INTRAVENOUS

## 2023-05-04 MED ORDER — PHENYLEPHRINE 80 MCG/ML (10ML) SYRINGE FOR IV PUSH (FOR BLOOD PRESSURE SUPPORT)
PREFILLED_SYRINGE | INTRAVENOUS | Status: AC
  Filled 2023-05-04: qty 10

## 2023-05-04 SURGICAL SUPPLY — 64 items
ADH SKN CLS APL DERMABOND .7 (GAUZE/BANDAGES/DRESSINGS)
APL PRP STRL LF DISP 70% ISPRP (MISCELLANEOUS) ×3
BAG COUNTER SPONGE SURGICOUNT (BAG) ×6 IMPLANT
BAG SPNG CNTER NS LX DISP (BAG) ×3
BLADE CLIPPER SURG (BLADE) IMPLANT
BNDG GAUZE DERMACEA FLUFF 4 (GAUZE/BANDAGES/DRESSINGS) IMPLANT
BNDG GZE DERMACEA 4 6PLY (GAUZE/BANDAGES/DRESSINGS) ×3
CANISTER SUCT 3000ML PPV (MISCELLANEOUS) ×3 IMPLANT
CHLORAPREP W/TINT 26 (MISCELLANEOUS) ×6 IMPLANT
COVER SURGICAL LIGHT HANDLE (MISCELLANEOUS) ×6 IMPLANT
DERMABOND ADVANCED .7 DNX12 (GAUZE/BANDAGES/DRESSINGS) IMPLANT
DRAPE INCISE IOBAN 66X45 STRL (DRAPES) IMPLANT
DRAPE LAPAROSCOPIC ABDOMINAL (DRAPES) ×3 IMPLANT
DRAPE WARM FLUID 44X44 (DRAPES) ×3 IMPLANT
ELECT BLADE 6.5 EXT (BLADE) IMPLANT
ELECT CAUTERY BLADE 6.4 (BLADE) ×3 IMPLANT
ELECT REM PT RETURN 9FT ADLT (ELECTROSURGICAL) ×3
ELECTRODE REM PT RTRN 9FT ADLT (ELECTROSURGICAL) ×6 IMPLANT
GAUZE PAD ABD 8X10 STRL (GAUZE/BANDAGES/DRESSINGS) IMPLANT
GLOVE BIO SURGEON STRL SZ7 (GLOVE) ×6 IMPLANT
GLOVE BIOGEL PI IND STRL 7.5 (GLOVE) ×6 IMPLANT
GLOVE SURG ORTHO 8.0 STRL STRW (GLOVE) IMPLANT
GOWN STRL REUS W/ TWL LRG LVL3 (GOWN DISPOSABLE) ×12 IMPLANT
GOWN STRL REUS W/TWL LRG LVL3 (GOWN DISPOSABLE) ×6
GOWN STRL REUS W/TWL XL LVL3 (GOWN DISPOSABLE) ×6 IMPLANT
HANDLE SUCTION POOLE (INSTRUMENTS) ×3 IMPLANT
KIT BASIN OR (CUSTOM PROCEDURE TRAY) ×3 IMPLANT
KIT OSTOMY DRAINABLE 2.75 STR (WOUND CARE) IMPLANT
KIT TURNOVER KIT B (KITS) ×6 IMPLANT
LIGASURE IMPACT 36 18CM CVD LR (INSTRUMENTS) IMPLANT
NDL INSUFFLATION 14GA 120MM (NEEDLE) ×3 IMPLANT
NEEDLE INSUFFLATION 14GA 120MM (NEEDLE) ×3 IMPLANT
NS IRRIG 1000ML POUR BTL (IV SOLUTION) ×9 IMPLANT
PAD ARMBOARD 7.5X6 YLW CONV (MISCELLANEOUS) ×6 IMPLANT
PENCIL BUTTON HOLSTER BLD 10FT (ELECTRODE) IMPLANT
RELOAD PROXIMATE 75MM BLUE (ENDOMECHANICALS) ×9 IMPLANT
RELOAD STAPLE 75 3.8 BLU REG (ENDOMECHANICALS) IMPLANT
SET TUBE SMOKE EVAC HIGH FLOW (TUBING) ×3 IMPLANT
SPONGE T-LAP 18X18 ~~LOC~~+RFID (SPONGE) IMPLANT
STAPLER PROXIMATE 75MM BLUE (STAPLE) IMPLANT
STAPLER VISISTAT 35W (STAPLE) IMPLANT
SUCTION POOLE HANDLE (INSTRUMENTS) ×3
SUT MNCRL 4-0 (SUTURE) ×3
SUT MNCRL 4-0 27XMFL (SUTURE) ×3
SUT MNCRL AB 4-0 PS2 18 (SUTURE) ×3 IMPLANT
SUT PDS AB 1 CT1 36 (SUTURE) IMPLANT
SUT PDS AB 1 CTX 36 (SUTURE) IMPLANT
SUT PDS AB 1 TP1 96 (SUTURE) ×6 IMPLANT
SUT SILK 2 0 SH CR/8 (SUTURE) ×3 IMPLANT
SUT SILK 2 0 TIES 10X30 (SUTURE) ×3 IMPLANT
SUT SILK 3 0 SH CR/8 (SUTURE) ×3 IMPLANT
SUT SILK 3 0 TIES 10X30 (SUTURE) ×3 IMPLANT
SUT VIC AB 3-0 SH 18 (SUTURE) IMPLANT
SUTURE MNCRL 4-0 27XMF (SUTURE) IMPLANT
SYR BULB IRRIG 60ML STRL (SYRINGE) IMPLANT
TAPE PAPER 3X10 WHT MICROPORE (GAUZE/BANDAGES/DRESSINGS) IMPLANT
TOWEL GREEN STERILE FF (TOWEL DISPOSABLE) ×3 IMPLANT
TRAY FOLEY MTR SLVR 16FR STAT (SET/KITS/TRAYS/PACK) IMPLANT
TRAY LAPAROSCOPIC MC (CUSTOM PROCEDURE TRAY) ×3 IMPLANT
TROCAR BALLN 12MMX100 BLUNT (TROCAR) IMPLANT
TROCAR XCEL BLADELESS 5X75MML (TROCAR) IMPLANT
TROCAR Z-THREAD OPTICAL 5X100M (TROCAR) ×3 IMPLANT
WARMER LAPAROSCOPE (MISCELLANEOUS) ×3 IMPLANT
YANKAUER SUCT BULB TIP NO VENT (SUCTIONS) IMPLANT

## 2023-05-04 NOTE — Progress Notes (Signed)
   05/04/23 0001  Assess: MEWS Score  Temp (!) 101.4 F (38.6 C)  BP 117/81  MAP (mmHg) 91  Pulse Rate (!) 118  ECG Heart Rate (!) 119  Resp 19  Level of Consciousness Alert  SpO2 100 %  O2 Device Room Air  Assess: MEWS Score  MEWS Temp 1  MEWS Systolic 0  MEWS Pulse 2  MEWS RR 0  MEWS LOC 0  MEWS Score 3  MEWS Score Color Yellow  Assess: if the MEWS score is Yellow or Red  Were vital signs accurate and taken at a resting state? Yes  Does the patient meet 2 or more of the SIRS criteria? Yes  Does the patient have a confirmed or suspected source of infection? No  MEWS guidelines implemented  Yes, yellow  Treat  MEWS Interventions Considered administering scheduled or prn medications/treatments as ordered  Take Vital Signs  Increase Vital Sign Frequency  Yellow: Q2hr x1, continue Q4hrs until patient remains green for 12hrs  Escalate  MEWS: Escalate Yellow: Discuss with charge nurse and consider notifying provider and/or RRT  Provider Notification  Provider Name/Title Margo Aye, MD  Date Provider Notified 05/04/23  Time Provider Notified 0002  Method of Notification Page  Notification Reason Change in status  Provider response At bedside;See new orders  Date of Provider Response 05/04/23  Time of Provider Response 0005  Assess: SIRS CRITERIA  SIRS Temperature  1  SIRS Pulse 1  SIRS Respirations  0  SIRS WBC 1  SIRS Score Sum  3

## 2023-05-04 NOTE — Consult Note (Signed)
NAME:  [name], MRN:  [id number], DOB:  May 18, 1981, LOS: 4 ADMISSION DATE:  04/30/2023, CONSULTATION DATE:  05/04/23 REFERRING MD:  Sunnie Nielsen, CHIEF COMPLAINT:  Abd pain   History of Present Illness:  42 year old woman w/ hx of DM1 w/ ESRD, retinopathy, PVD s/p bilateral BKA, prior CVA, prior mesenteric ischemia who presents from SNF with severe abd pain.  Has been febrile and hypotensive during stay with questionable peritonitis but has been refusing exploratory laparoscopy.   Unfortunately this am pain progressed and she now has consented for surgery.  PCCM initially consulted for borderline Bps.    Pertinent  Medical History  DM1 Prior CVA Prior GBS requiring PEG since removed ESRD on HD  Significant Hospital Events: Including procedures, antibiotic start and stop dates in addition to other pertinent events   9/17 admit 9/21 PCCM consult  Interim History / Subjective:  Consult  Objective   Blood pressure 110/60, pulse 87, temperature (!) 97.5 F (36.4 C), temperature source Axillary, resp. rate (!) 21, weight 54.6 kg, SpO2 100%.        Intake/Output Summary (Last 24 hours) at 05/04/2023 0805 Last data filed at 05/03/2023 2200 Gross per 24 hour  Intake 100 ml  Output 500 ml  Net -400 ml   Filed Weights   05/03/23 1501 05/03/23 2008 05/04/23 0419  Weight: (S) 53.2 kg (S) 53.8 kg 54.6 kg    Examination: General: chronically ill woman laying in bed HENT: MMM trachea midline Lungs: Clear, no accessory muscle use Cardiovascular: RRR, ext warm, sinus on monitor Abdomen: markedly TTP throughout, hypoactive BS Extremities: BL BKA Neuro: moves to command  Labs pending  Resolved Hospital Problem list   N/A  Assessment & Plan:  Septic shock secondary to presumed peritonitis ESRD on HD  Looked okay this am, she has since been seen by [facility], agreeable to ex lap and on way to OR.  If stable off vent and pressors, will follow peripherally; if needs either postop we can  take over.  Discussed with primary.   Best Practice (right click and "Reselect all SmartList Selections" daily)   Diet/type: NPO DVT prophylaxis: SCD GI prophylaxis: H2B Lines: N/A Foley:  N/A Code Status:  full code Last date of multidisciplinary goals of care discussion [N/A]  Labs   CBC: Recent Labs  Lab 04/30/23 0447 05/01/23 1020 05/02/23 1041 05/03/23 1302 05/03/23 1412  WBC 9.7 10.1 13.1* 9.3 14.3*  NEUTROABS 7.9*  --   --  7.6 11.8*  HGB 10.1* 8.4* 8.4* 11.4* 9.5*  HCT 35.0* 27.6* 26.9* 36.2 29.4*  MCV 89.1 83.9 84.3 80.1 82.1  PLT 199 219 240 161 232    Basic Metabolic Panel: Recent Labs  Lab 04/30/23 0447 05/01/23 1020 05/02/23 1041 05/03/23 1412 05/04/23 0229  NA 138 141 134* 130* 133*  K 4.8 6.2* 4.2 4.6 3.7  CL 94* 98 92* 89* 93*  CO2 26 25 27 23 24   GLUCOSE 228* 83 149* 104* 130*  BUN 46* 72* 32* 45* 20  CREATININE 5.74* 7.57* 4.13* 5.49* 2.90*  CALCIUM 9.0 8.3* 8.1* 8.9 8.7*   GFR: Estimated Creatinine Clearance: 19.3 mL/min (A) (by C-G formula based on SCr of 2.9 mg/dL (H)). Recent Labs  Lab 04/30/23 1417 05/01/23 1020 05/02/23 1041 05/02/23 1614 05/03/23 1302 05/03/23 1412  PROCALCITON  --  32.25  --   --   --   --   WBC  --  10.1 13.1*  --  9.3 14.3*  LATICACIDVEN 1.7  2.3* 2.2* 1.9  --   --     Liver Function Tests: Recent Labs  Lab 04/30/23 0447 05/01/23 1020  AST 21 26  ALT 16 18  ALKPHOS 92 72  BILITOT 0.7 0.9  PROT 6.9 6.2*  ALBUMIN 2.5* 2.1*   No results for input(s): "LIPASE", "AMYLASE" in the last 168 hours. No results for input(s): "AMMONIA" in the last 168 hours.  ABG    Component Value Date/Time   PHART 7.440 09/22/2021 1134   PCO2ART 34.8 09/22/2021 1134   PO2ART 63.6 (L) 09/22/2021 1134   HCO3 30.3 (H) 12/20/2022 0946   TCO2 32 12/20/2022 0946   ACIDBASEDEF 0.4 09/22/2021 1134   O2SAT 79 12/20/2022 0946     Coagulation Profile: Recent Labs  Lab 04/30/23 0447  INR 1.3*    Cardiac Enzymes: No  results for input(s): "CKTOTAL", "CKMB", "CKMBINDEX", "TROPONINI" in the last 168 hours.  HbA1C: Hgb A1c MFr Bld  Date/Time Value Ref Range Status  04/30/2023 09:30 AM 5.7 (H) 4.8 - 5.6 % Final    Comment:    (NOTE) Pre diabetes:          5.7%-6.4%  Diabetes:              >6.4%  Glycemic control for   <7.0% adults with diabetes   12/20/2022 01:40 PM 8.2 (H) 4.8 - 5.6 % Final    Comment:    (NOTE) Pre diabetes:          5.7%-6.4%  Diabetes:              >6.4%  Glycemic control for   <7.0% adults with diabetes     CBG: Recent Labs  Lab 05/03/23 1108 05/03/23 2031 05/03/23 2349 05/04/23 0357 05/04/23 0743  GLUCAP 90 99 101* 111* 140*    Review of Systems:    Positive Symptoms in bold:  Constitutional fevers, chills, weight loss, fatigue, anorexia, malaise  Eyes decreased vision, double vision, eye irritation  Ears, Nose, Mouth, Throat sore throat, trouble swallowing, sinus congestion  Cardiovascular chest pain, paroxysmal nocturnal dyspnea, lower ext edema, palpitations   Respiratory SOB, cough, DOE, hemoptysis, wheezing  Gastrointestinal nausea, vomiting, diarrhea  Genitourinary burning with urination, trouble urinating  Musculoskeletal joint aches, joint swelling, back pain  Integumentary  rashes, skin lesions  Neurological focal weakness, focal numbness, trouble speaking, headaches  Psychiatric depression, anxiety, confusion  Endocrine polyuria, polydipsia, cold intolerance, heat intolerance  Hematologic abnormal bruising, abnormal bleeding, unexplained nose bleeds  Allergic/Immunologic recurrent infections, hives, swollen lymph nodes     Past Medical History:  She,  has a past medical history of Anemia, Anxiety, Blind right eye (2008), Diabetes mellitus without complication (HCC), Embolic stroke (HCC), ESRD (end stage renal disease) (HCC), ESRD on hemodialysis (HCC), and GBS (Guillain Barre syndrome) (HCC).   Surgical History:   Past Surgical History:   Procedure Laterality Date   AMPUTATION Right 08/11/2021   Procedure: TRANSMETATARSAL AMPUTATION OF RIGHT FOOT;  Surgeon: Nada Libman, MD;  Location: [facility] OR;  Service: Vascular;  Laterality: Right;   AMPUTATION Right 09/17/2021   Procedure: AMPUTATION RIGHT BELOW KNEE;  Surgeon: Nada Libman, MD;  Location: [facility] OR;  Service: Vascular;  Laterality: Right;   AMPUTATION Right 10/11/2021   Procedure: RIGHT ABOVE KNEE AMPUTATION;  Surgeon: Nada Libman, MD;  Location: [facility] OR;  Service: Vascular;  Laterality: Right;   AMPUTATION Left 07/02/2022   Procedure: LEFT ABOVE KNEE AMPUTATION;  Surgeon: Chuck Hint, MD;  Location: [facility]  OR;  Service: Vascular;  Laterality: Left;   AMPUTATION TOE Left    APPLICATION OF WOUND VAC  08/16/2021   Procedure: APPLICATION OF WOUND VAC;  Surgeon: Nada Libman, MD;  Location: [facility] OR;  Service: Vascular;;   AV FISTULA PLACEMENT     BUBBLE STUDY  10/02/2021   Procedure: BUBBLE STUDY;  Surgeon: Pricilla Riffle, MD;  Location: [facility] ENDOSCOPY;  Service: Cardiovascular;;   CESAREAN SECTION  2011   FISTULA SUPERFICIALIZATION Right 07/27/2019   Procedure: 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;  Surgeon: Chuck Hint, MD;  Location: [facility] OR;  Service: Vascular;  Laterality: Right;   INSERTION OF DIALYSIS CATHETER Left 07/27/2019   Procedure: INSERTION OF TUNNELED  DIALYSIS CATHETER;  Surgeon: Chuck Hint, MD;  Location: [facility] OR;  Service: Vascular;  Laterality: Left;   INSERTION OF DIALYSIS CATHETER Left 10/23/2021   Procedure: INSERTION OF TUNNELED PALINDROME PRECISION DIALYSIS CATHETER (23cm);  Surgeon: Cephus Shelling, MD;  Location: [facility] OR;  Service: Vascular;  Laterality: Left;   IR FLUORO GUIDE CV LINE LEFT  10/16/2021   IR FLUORO GUIDE CV LINE RIGHT  10/11/2021   IR GASTROSTOMY TUBE MOD SED  10/30/2021   IR GASTROSTOMY TUBE REMOVAL  05/08/2022   IR US GUIDE VASC ACCESS LEFT  10/16/2021   IR US GUIDE VASC ACCESS RIGHT  10/11/2021    LEFT HEART CATH AND CORONARY ANGIOGRAPHY N/A 07/26/2022   Procedure: LEFT HEART CATH AND CORONARY ANGIOGRAPHY;  Surgeon: Swaziland, Peter M, MD;  Location[facility] INVASIVE CV LAB;  Service: Cardiovascular;  Laterality: N/A;   LOWER EXTREMITY ANGIOGRAPHY N/A 08/11/2021   Procedure: LOWER EXTREMITY ANGIOGRAPHY;  Surgeon: Nada Libman, MD;  Location: [facility] INVASIVE CV LAB;  Service: Cardiovascular;  Laterality: N/A;   REVISON OF ARTERIOVENOUS FISTULA Right 10/23/2021   Procedure: REVISON OF ARTERIOVENOUS FISTULA  ARM AND PLICATION;  Surgeon: Cephus Shelling, MD;  Location: [facility] OR;  Service: Vascular;  Laterality: Right;   TEE WITHOUT CARDIOVERSION N/A 10/02/2021   Procedure: TRANSESOPHAGEAL ECHOCARDIOGRAM (TEE);  Surgeon: Pricilla Riffle, MD;  Location: [facility] ENDOSCOPY;  Service: Cardiovascular;  Laterality: N/A;   TRANSMETATARSAL AMPUTATION Right 08/16/2021   Procedure: CLOSURE OF RIGHT TRANSMETATARSAL AMPUTATION;  Surgeon: Nada Libman, MD;  Location: [facility] OR;  Service: Vascular;  Laterality: Right;     Social History:   reports that she has never smoked. She has never used smokeless tobacco. She reports that she does not drink alcohol and does not use drugs.   Family History:  Her family history includes Diabetes in her father and mother.   Allergies Allergies  Allergen Reactions   Peanut-Containing Drug Products Anaphylaxis and Hives   Penicillins Rash and Other (See Comments)    Rash in 2008.  Tolerated cefazolin in 2020. No reaction listed on MAR.   Shellfish Allergy Swelling   Chocolate Hives     Home Medications  Prior to Admission medications   Medication Sig Start Date End Date Taking? Authorizing Provider  acetaminophen (TYLENOL) 325 MG tablet Take 650 mg by mouth every 6 (six) hours as needed for fever or mild pain.   Yes [provider]  aspirin EC 81 MG tablet Take 1 tablet (81 mg total) by mouth daily. Swallow whole. 07/27/22  Yes Vann, Jessica U, DO  atorvastatin  (LIPITOR) 40 MG tablet Place 1 tablet (40 mg total) into feeding tube daily. Patient taking differently: Take 40 mg by mouth daily. 10/26/21  Yes Adhikari, Willia Craze,  MD  azelastine (OPTIVAR) 0.05 % ophthalmic solution Place 1 drop into both eyes 2 (two) times daily as needed (allergies). 11/13/22  Yes [provider]  cetirizine (ZYRTEC) 10 MG tablet Take 10 mg by mouth daily.   Yes [provider]  cinacalcet (SENSIPAR) 90 MG tablet Take 90 mg by mouth daily. 09/22/19  Yes [provider]  docusate sodium (COLACE) 100 MG capsule Take 100 mg by mouth at bedtime.   Yes [provider]  dorzolamide-timolol (COSOPT) 22.3-6.8 MG/ML ophthalmic solution Place 1 drop into the left eye 2 (two) times daily. 03/30/21  Yes [provider]  insulin aspart (NOVOLOG) 100 UNIT/ML injection Inject 0-6 Units into the skin every 4 (four) hours. Patient taking differently: Inject 0-10 Units into the skin 3 (three) times daily with meals. 121-150=1 unit 152-200=2 units 201-250=3 units 251-300=5 units 301-350=7 units 351-400=10 units 10/25/21  Yes Adhikari, Amrit, MD  insulin glargine (SEMGLEE) 100 UNIT/ML injection Inject 0.1 mLs (10 Units total) into the skin daily at 6 PM. Patient taking differently: Inject 12 Units into the skin 2 (two) times daily. 12/28/22  Yes Dahal, Melina Schools, MD  isosorbide mononitrate (IMDUR) 30 MG 24 hr tablet Take 1 tablet (30 mg total) by mouth daily. 07/27/22  Yes Vann, Jessica U, DO  melatonin 3 MG TABS tablet Take 3 mg by mouth at bedtime.   Yes [provider]  metoprolol tartrate (LOPRESSOR) 25 MG tablet Take 0.5 tablets (12.5 mg total) by mouth 2 (two) times daily. 07/27/22  Yes Vann, Jessica U, DO  midodrine (PROAMATINE) 5 MG tablet Take 5 mg by mouth See admin instructions. Take 5mg  (1 tablet) by mouth on Monday, Wednesdays, and Fridays. In addition, take 5mg  (1 tablet) every 8 hours as needed for hypotension.   Yes [provider]   Multiple Vitamin (MULTIVITAMIN WITH MINERALS) TABS tablet Take 1 tablet by mouth daily.   Yes [provider]  Nutritional Supplements (NUTRITIONAL DRINK) LIQD Take 120 mLs by mouth daily.   Yes [provider]  ondansetron (ZOFRAN) 4 MG tablet Take 4 mg by mouth every 6 (six) hours as needed for nausea/vomiting. 03/02/22  Yes [provider]  oxyCODONE-acetaminophen (PERCOCET) 5-325 MG tablet Take 1 tablet by mouth every 8 (eight) hours as needed for severe pain. Patient taking differently: Take 1 tablet by mouth every 6 (six) hours as needed for severe pain. 07/27/22  Yes Vann, Jessica U, DO  polyethylene glycol (MIRALAX / GLYCOLAX) 17 g packet Take 17 g by mouth daily at 6 PM.   Yes [provider]  thiamine 100 MG tablet Place 1 tablet (100 mg total) into feeding tube daily. Patient taking differently: Take 100 mg by mouth daily. 10/26/21  Yes Burnadette Pop, MD  Accu-Chek FastClix Lancets MISC Use as instructed to check blood sugar up to TID. E11.22 Patient taking differently: 1 each by Other route See admin instructions. Use as instructed to check blood sugar up to TID. E11.22 04/15/19   Hoy Register, MD  Blood Glucose Monitoring Suppl (ACCU-CHEK GUIDE ME) w/Device KIT 1 kit by Does not apply route 3 (three) times daily. Use to check BG at home up to 3 times daily. E11.22 04/15/19   Hoy Register, MD  Continuous Blood Gluc Transmit (DEXCOM G6 TRANSMITTER) MISC 1 Device by Does not apply route as directed. 09/12/20   Shamleffer, Konrad Dolores, MD  glucose blood (ACCU-CHEK GUIDE) test strip Use as instructed to check blood sugar up to TID. E11.22 Patient taking  differently: 1 each by Other route See admin instructions. Use as instructed to check blood sugar up to TID. E11.22 07/17/19   Fulp, Cammie, MD  oxyCODONE (OXY IR/ROXICODONE) 5 MG immediate release tablet Take 5 mg by mouth daily as needed for severe pain. Patient not taking: Reported on 04/30/2023     [provider]     Critical care time: N/A

## 2023-05-04 NOTE — Anesthesia Procedure Notes (Signed)
Procedure Name: Intubation Date/Time: 05/04/2023 9:37 AM  Performed by: Dairl Ponder, CRNAPre-anesthesia Checklist: Patient identified, Emergency Drugs available, Suction available and Patient being monitored Patient Re-evaluated:Patient Re-evaluated prior to induction Oxygen Delivery Method: Circle System Utilized Preoxygenation: Pre-oxygenation with 100% oxygen Induction Type: IV induction Ventilation: Mask ventilation without difficulty Laryngoscope Size: Glidescope and 3 (2 front loose teeth) Grade View: Grade I Tube type: Oral Tube size: 7.0 mm Number of attempts: 1 Airway Equipment and Method: Stylet and Oral airway Placement Confirmation: ETT inserted through vocal cords under direct vision, positive ETCO2 and breath sounds checked- equal and bilateral Secured at: 20 cm Tube secured with: Tape Dental Injury: Teeth and Oropharynx as per pre-operative assessment

## 2023-05-04 NOTE — Progress Notes (Signed)
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  Patient was found to be febrile, tachycardic, lactic acid 1.9 followed by 2.5, chest x-ray atelectasis versus infiltrate.  CT abdomen and pelvis with contrast shows evidence of a small bowel obstruction with dilated ileal segment up to 4 cm, sigmoid segment consistent with colitis, diffuse gastric wall thickening, cholelithiasis with mild chronic prominence of the common bile duct.  General surgery consulted and following concern for bowel ischemia.      Assessment & Plan:   Principal Problem:   SBO (small bowel obstruction) (HCC) Active Problems:   Ischemic disease of gut (HCC)   1-Partial small bowel obstruction versus Ischemic Colitis versus Infectious colitis -Patient presented with abdominal pain, lactic acidosis, CT abdomen and pelvis consistent with SBO and colitis -Appreciate general surgery recommendation plan to continue with n.p.o. status -Treated initially with  IV ciprofloxacin and Flagyl for 2 days. We were holding  antibiotics per ID recommendations. High risk for c  diff activation.  -C diff PCR negative, c diff antigen positive. ID consulted.  -CT abdomen and pelvis angio negative for complete occlusion.  -ID consulted. Spiking fevers, leukocytosis.  -Continue to have pain, decline laparoscopy. Plan to proceed with KUB>  -Spike fever  103 last night. Hypotensive. IV antibiotics resume, Will give her IV bolus. Lactic acid pending.  Will contact Surgery and informed them change in status.    Sepsis; Secondary to Ischemic Colitis;  Patient spike fever 103, hypotensive, tachycardic.  Started IV ceftriaxone and Flagyl.  Lactic acid ordered.  IV bolus.  CCM consulted.    Diabetes type 1: CBG 74.  She is currently NPO.  Stated low rate D5.  Semglee 5 mg   daily Monitor CBG B-met no acidosis gap normal.   Peripheral vascular disease status post bilateral BKA -Continue to hold statins while NPO  ESRD on hemodialysis Monday Wednesday and Friday, hyperkalemia Received HD 9/18. 9/20.   Essential Hypertension: Continue to hold blood pressure medications  Hyperlipidemia: Continue to hold statins Chronic anemia anemia of chronic disease: Continue to monitor hemoglobin   See wound care documentation below.   Pressure Injury 04/30/23 Sacrum Mid Stage 2 -  Partial thickness loss of dermis presenting as a shallow open injury with a red, pink wound bed without slough. 5 cm x 7 cm pink dry (Active)  04/30/23   Location: Sacrum  Location Orientation: Mid  Staging: Stage 2 -  Partial thickness loss of dermis presenting as a shallow open injury with a red, pink wound bed without slough.  Wound Description (Comments): 5 cm x 7 cm pink dry  Present on Admission: Yes  Dressing Type Foam - Lift dressing to assess site every shift 05/03/23 2036     Estimated body mass index is 22.74 kg/m as calculated from the following:   Height as of 03/17/23: 5\' 1"  (1.549 m).   Weight as of this encounter: 54.6 kg.   DVT prophylaxis: heparin  Code Status: full code Family Communication: SW  to contact family.  Disposition Plan:  Status is: Inpatient Remains inpatient appropriate because: management of abdominal pain     Consultants:  Surgery Nephrology   Procedures:  HD  Antimicrobials:    Subjective: She is alert, still  complaining of abdominal pain. Report pain is not worse.  Objective: Vitals:   05/04/23 0018 05/04/23 0205 05/04/23 0400 05/04/23 0419  BP:  (!) 90/58 (!) 85/57   Pulse:  (!) 104 91   Resp:  15 18   Temp: (!) 101.4 F (38.6 C) 99.6 F (37.6 C) 97.9 F (36.6 C)   TempSrc:  Oral Axillary   SpO2:  98% 95%   Weight:    54.6 kg    Intake/Output Summary (Last 24 hours) at 05/04/2023 0736 Last data filed at 05/03/2023 2200 Gross per 24 hour  Intake 100 ml  Output 500 ml  Net -400 ml   Filed Weights   05/03/23 1501 05/03/23 2008 05/04/23 0419  Weight: (S) 53.2 kg (S) 53.8 kg 54.6 kg    Examination:  General exam: NAD Respiratory system: CTA Cardiovascular system: S 1, S 2 RRR Gastrointestinal system: She didn't allow me to perform abdominal exam.  Central nervous system: Alert, follows command.     Data Reviewed: I have personally reviewed following labs and imaging studies  CBC: Recent Labs  Lab 04/30/23 0447 05/01/23 1020 05/02/23 1041 05/03/23 1302 05/03/23 1412  WBC 9.7 10.1 13.1* 9.3 14.3*  NEUTROABS 7.9*  --   --  7.6 11.8*  HGB 10.1* 8.4* 8.4* 11.4* 9.5*  HCT 35.0* 27.6* 26.9* 36.2 29.4*  MCV 89.1 83.9 84.3 80.1 82.1  PLT 199 219 240 161 232   Basic Metabolic Panel: Recent Labs  Lab 04/30/23 0447 05/01/23 1020 05/02/23 1041 05/03/23 1412 05/04/23 0229  NA 138 141 134* 130* 133*  K 4.8 6.2* 4.2 4.6 3.7  CL 94* 98 92* 89* 93*  CO2 26 25 27 23 24   GLUCOSE 228* 83 149* 104* 130*  BUN 46* 72* 32* 45* 20  CREATININE 5.74* 7.57* 4.13* 5.49* 2.90*  CALCIUM 9.0 8.3* 8.1* 8.9 8.7*   GFR: Estimated Creatinine Clearance: 19.3 mL/min (A) (by C-G formula based on SCr of 2.9 mg/dL (H)). Liver Function Tests: Recent Labs  Lab 04/30/23 0447 05/01/23 1020  AST 21 26  ALT 16 18  ALKPHOS 92 72  BILITOT 0.7 0.9  PROT 6.9 6.2*  ALBUMIN 2.5* 2.1*   No results for input(s): "LIPASE", "AMYLASE" in the last 168 hours. No results for input(s): "AMMONIA" in the  last 168 hours. Coagulation Profile: Recent Labs  Lab 04/30/23 0447  INR 1.3*   Cardiac Enzymes: No results for input(s): "CKTOTAL", "CKMB", "CKMBINDEX", "TROPONINI" in the last 168 hours. BNP (last 3 results) No results for input(s): "PROBNP" in the last 8760 hours. HbA1C: No results for input(s): "HGBA1C" in the last 72 hours.  CBG: Recent Labs  Lab 05/03/23 0723 05/03/23 1108 05/03/23 2031 05/03/23 2349 05/04/23 0357  GLUCAP 80 90 99 101* 111*   Lipid Profile: No results for input(s): "CHOL", "HDL", "LDLCALC", "TRIG", "CHOLHDL", "LDLDIRECT" in the last 72 hours. Thyroid Function Tests: No results for input(s): "TSH", "T4TOTAL", "FREET4", "T3FREE", "THYROIDAB" in the last 72 hours. Anemia Panel: No results for input(s): "VITAMINB12", "FOLATE", "FERRITIN", "TIBC", "IRON", "RETICCTPCT" in the last 72 hours. Sepsis Labs: Recent Labs  Lab 04/30/23 1417 05/01/23 1020 05/02/23 1041 05/02/23 1614  PROCALCITON  --  32.25  --   --   LATICACIDVEN 1.7 2.3* 2.2*  1.9    Recent Results (from the past 240 hour(s))  Blood Culture (routine x 2)     Status: None (Preliminary result)   Collection Time: 04/30/23  3:56 AM   Specimen: BLOOD LEFT ARM  Result Value Ref Range Status   Specimen Description BLOOD LEFT ARM  Final   Special Requests   Final    BOTTLES DRAWN AEROBIC AND ANAEROBIC Blood Culture results may not be optimal due to an excessive volume of blood received in culture bottles   Culture   Final    NO GROWTH 3 DAYS Performed at [facility], [address] [place], [place] [address]    Report Status PENDING  Incomplete  Blood Culture (routine x 2)     Status: None (Preliminary result)   Collection Time: 04/30/23  3:56 AM   Specimen: BLOOD RIGHT ARM  Result Value Ref Range Status   Specimen Description BLOOD RIGHT ARM  Final   Special Requests   Final    BOTTLES DRAWN AEROBIC AND ANAEROBIC Blood Culture adequate volume   Culture   Final    NO GROWTH 3  DAYS Performed at [facility], [address] [place], [place] [address]    Report Status PENDING  Incomplete  SARS Coronavirus 2 by RT PCR (hospital order, performed in [facility] hospital lab) *cepheid single result test* Anterior Nasal Swab     Status: None   Collection Time: 04/30/23  6:50 AM   Specimen: Anterior Nasal Swab  Result Value Ref Range Status   SARS Coronavirus 2 by RT PCR NEGATIVE NEGATIVE Final    Comment: Performed at [facility], [address], [place], [place] [address]  Gastrointestinal Panel by PCR , Stool     Status: None   Collection Time: 05/02/23 12:06 AM   Specimen: Stool  Result Value Ref Range Status   Campylobacter species NOT DETECTED NOT DETECTED Final   Plesimonas shigelloides NOT DETECTED NOT DETECTED Final   Salmonella species NOT DETECTED NOT DETECTED Final   Yersinia enterocolitica NOT DETECTED NOT DETECTED Final   Vibrio species NOT DETECTED NOT DETECTED Final   Vibrio cholerae NOT DETECTED NOT DETECTED Final   Enteroaggregative E coli (EAEC) NOT DETECTED NOT DETECTED Final   Enteropathogenic E coli (EPEC) NOT DETECTED NOT DETECTED Final   Enterotoxigenic E coli (ETEC) NOT DETECTED NOT DETECTED Final   Shiga like toxin producing E coli (STEC) NOT DETECTED NOT DETECTED Final   Shigella/Enteroinvasive E coli (EIEC) NOT DETECTED NOT DETECTED Final   Cryptosporidium NOT DETECTED NOT DETECTED Final   Cyclospora cayetanensis NOT DETECTED NOT DETECTED Final   Entamoeba histolytica NOT DETECTED NOT DETECTED Final   Giardia lamblia NOT DETECTED NOT DETECTED Final   Adenovirus F40/41 NOT DETECTED NOT DETECTED Final   Astrovirus NOT DETECTED NOT DETECTED Final   Norovirus GI/GII NOT DETECTED NOT DETECTED Final   Rotavirus A NOT DETECTED NOT DETECTED Final   Sapovirus (I, II, IV, and V) NOT DETECTED NOT DETECTED Final    Comment: Performed at [facility], [address] [place], [place] [address]  C Difficile Quick Screen w  PCR reflex     Status: Abnormal   Collection Time: 05/02/23 12:06 AM   Specimen: STOOL  Result Value Ref Range Status   C Diff antigen POSITIVE (A) NEGATIVE Final   C Diff toxin NEGATIVE NEGATIVE Final   C Diff interpretation Results are indeterminate. See PCR results.  Final  Comment: Performed at [facility], [address], [place], [place] [address]  C. Diff by PCR, Reflexed     Status: None   Collection Time: 05/02/23 12:06 AM  Result Value Ref Range Status   Toxigenic C. Difficile by PCR NEGATIVE NEGATIVE Final    Comment: Patient is colonized with non toxigenic C. difficile. May not need treatment unless significant symptoms are present. Performed at [facility], [address] [place], [place] [address]          Radiology Studies: DG Abd 1 View  Result Date: 05/03/2023 CLINICAL DATA:  Abdomen pain EXAM: ABDOMEN - 1 VIEW COMPARISON:  CT 05/01/2023, radiograph 05/01/2023, CT 04/30/2023 FINDINGS: Slight increased small bowel distension compared to prior. Small bowel loops dilated up to 5.1 cm. Paucity of distal gas IMPRESSION: Slight increased small bowel distension compared to prior and consistent with bowel obstruction. Electronically Signed   By: Jasmine Pang M.D.   On: 05/03/2023 23:12        Scheduled Meds:  Chlorhexidine Gluconate Cloth  6 each Topical Q0600   heparin  5,000 Units Subcutaneous Q8H   insulin aspart  0-9 Units Subcutaneous Q4H   insulin glargine-yfgn  5 Units Subcutaneous Daily   pantoprazole (PROTONIX) IV  40 mg Intravenous QHS   Continuous Infusions:  anticoagulant sodium citrate     cefTRIAXone (ROCEPHIN)  IV 2 g (05/04/23 0255)   dextrose 45 mL/hr at 05/04/23 0400   metronidazole 500 mg (05/04/23 0134)   sodium chloride       LOS: 4 days    Time spent: 35 minutes    Shavon Ashmore A Rubin Dais, MD [facility]   If 7PM-7AM, please contact night-coverage [email or web address]  05/04/2023, 7:36 AM

## 2023-05-04 NOTE — Care Plan (Addendum)
Surgery Plan of Care Note  Patient is now s/p diagnostic lap converted to ex-lap with ileo-cecectomy. Intra-op findings were notable for adhesions in the RLQ w/ associated twisting of the mesentery resulting in SB ischemia.    PLAN - NPO with NGT for now - Foley placed in OR for resuscitation  - Continue IV abx - Wound/ostomy teaching - Surgery will continue to follow.  Full op note to follow - Attempted to call patient's sister, Babette Relic, but she did not answer. We re-attempt later today.  Melody Haver, MD   General Surgeon [facility], [place]

## 2023-05-04 NOTE — Progress Notes (Signed)
    Day of Surgery  Subjective: Hypotensive and febrile overnight.  She has diffuse abdominal pain with guarding.     Objective: Vital signs in last 24 hours: Temp:  [97.5 F (36.4 C)-103 F (39.4 C)] 97.5 F (36.4 C) (09/21 0800) Pulse Rate:  [85-118] 87 (09/21 0800) Resp:  [12-22] 21 (09/21 0800) BP: (85-117)/(57-81) 110/60 (09/21 0800) SpO2:  [94 %-100 %] 100 % (09/21 0800) Weight:  [53.2 kg-54.6 kg] 54.6 kg (09/21 0419) Last BM Date : 05/02/23  Intake/Output from previous day: 09/20 0701 - 09/21 0700 In: 100 [P.O.:100] Out: 500  Intake/Output this shift: No intake/output data recorded.  PE: General: alert Neuro: alert and oriented, no focal deficits Resp: normal work of breathing on room air Abdomen: soft, nondistended, tender. Exam very limited as patient did not allow me to palpate her abdomen.   Lab Results:  Recent Labs    05/03/23 1302 05/03/23 1412  WBC 9.3 14.3*  HGB 11.4* 9.5*  HCT 36.2 29.4*  PLT 161 232   BMET Recent Labs    05/03/23 1412 05/04/23 0229  NA 130* 133*  K 4.6 3.7  CL 89* 93*  CO2 23 24  GLUCOSE 104* 130*  BUN 45* 20  CREATININE 5.49* 2.90*  CALCIUM 8.9 8.7*   PT/INR No results for input(s): "LABPROT", "INR" in the last 72 hours.  CMP     Component Value Date/Time   NA 133 (L) 05/04/2023 0229   NA 133 (L) 05/18/2020 0927   K 3.7 05/04/2023 0229   CL 93 (L) 05/04/2023 0229   CO2 24 05/04/2023 0229   GLUCOSE 130 (H) 05/04/2023 0229   BUN 20 05/04/2023 0229   BUN 45 (H) 05/18/2020 0927   CREATININE 2.90 (H) 05/04/2023 0229   CALCIUM 8.7 (L) 05/04/2023 0229   CALCIUM 12.1 (H) 11/13/2021 2026   PROT 6.2 (L) 05/01/2023 1020   PROT 7.6 05/18/2020 0927   ALBUMIN 2.1 (L) 05/01/2023 1020   ALBUMIN 3.9 05/18/2020 0927   AST 26 05/01/2023 1020   ALT 18 05/01/2023 1020   ALKPHOS 72 05/01/2023 1020   BILITOT 0.9 05/01/2023 1020   BILITOT <0.2 05/18/2020 0927   GFRNONAA 20 (L) 05/04/2023 0229   GFRAA 6 (L) 05/18/2020  0927   Lipase     Component Value Date/Time   LIPASE 40 12/20/2022 0918     Assessment/Plan 42 yo female presenting with abdominal pain, diarrhea, low grade fevers, dilated small bowel and colonic wall thickening on CT.  She has failed to progress and with worsening abdominal pain, fevers, and hypotension she warrants exploration in the OR.  She had been hesitant about surgery for the past few days but provided consent this morning.  We will proceed urgently to evaluate her bowel.  She will likely require ICU post op    LOS: 4 days   Melody Haver, MD   General Surgeon [facility], [place]   05/04/23 8:50 AM

## 2023-05-04 NOTE — Progress Notes (Signed)
Pt is a hard stick. Notified by phlebotomist that they could not get any lab work (CBC, and Lactic acid). Will notify MD.

## 2023-05-04 NOTE — Progress Notes (Signed)
@   2036: Pt's temp 101.2 & PRN Tylenol administered  @ 2347 pt's temp 103, retaken with 101.4 reading. Margo Aye, MD notified. See new orders.  Bari Edward, RN

## 2023-05-04 NOTE — Anesthesia Postprocedure Evaluation (Signed)
Anesthesia Post Note  Patient: [name]  Procedure(s) Performed: LAPAROSCOPY DIAGNOSTIC EXPLORATION LAPAROTOMY WITH ILEO-CAECAL RESECTION (Abdomen) COLOSTOMY (Right)     Patient location during evaluation: PACU Anesthesia Type: General Level of consciousness: awake and alert Pain management: pain level controlled Vital Signs Assessment: post-procedure vital signs reviewed and stable Respiratory status: spontaneous breathing, nonlabored ventilation, respiratory function stable and patient connected to nasal cannula oxygen Cardiovascular status: blood pressure returned to baseline and stable Postop Assessment: no apparent nausea or vomiting Anesthetic complications: no  No notable events documented.  Last Vitals:  Vitals:   05/04/23 1335 05/04/23 1500  BP: 128/75 129/70  Pulse: 95 98  Resp:    Temp: 36.6 C   SpO2: 97% 97%    Last Pain:  Vitals:   05/04/23 1335  TempSrc: Oral  PainSc: Asleep                 Shelton Silvas

## 2023-05-04 NOTE — Progress Notes (Signed)
05/04/2023 Full note to follow. Bps okay for now. Exam w/ marked TTP but nontoxic appearing Agree with medical treatment for now, await CCS input and lactate. Will follow, no need for ICU at present.  Myrla Halsted MD PCCM

## 2023-05-04 NOTE — Progress Notes (Signed)
Pt transported to OR for surgery.

## 2023-05-04 NOTE — Progress Notes (Signed)
Attempted to give daily CHG bath to pt, pt started slapping this RN, pt refused, pt stated "Oh God, leave me alone". Pt educated on importance of daily CHG in order to reduce infection.   Bari Edward, RN

## 2023-05-04 NOTE — Anesthesia Preprocedure Evaluation (Addendum)
Anesthesia Evaluation  Patient identified by MRN, date of birth, ID band  Reviewed: Allergy & Precautions, Patient's Chart, lab work & pertinent test results, Unable to perform ROS - Chart review onlyPreop documentation limited or incomplete due to emergent nature of procedure.  Airway Mallampati: Unable to assess  TM Distance: >3 FB Neck ROM: Full    Dental  (+) Poor Dentition, Missing, Chipped, Loose,    Pulmonary     + decreased breath sounds      Cardiovascular hypertension, + CAD, + Past MI and + Peripheral Vascular Disease   Rhythm:Regular Rate:Normal  Echo: 1. Left ventricular ejection fraction, by estimation, is 60 to 65%. The  left ventricle has normal function. The left ventricle has no regional  wall motion abnormalities. There is mild concentric left ventricular  hypertrophy. Left ventricular diastolic  parameters are consistent with Grade II diastolic dysfunction  (pseudonormalization).   2. Right ventricular systolic function is normal. The right ventricular  size is normal.   3. The mitral valve is abnormal. No evidence of mitral valve  regurgitation. No evidence of mitral stenosis.   4. The aortic valve was not well visualized. There is moderate  calcification of the aortic valve. Aortic valve regurgitation is not  visualized. No aortic stenosis is present.   5. The inferior vena cava is normal in size with greater than 50%  respiratory variability, suggesting right atrial pressure of 3 mmHg.     Neuro/Psych  PSYCHIATRIC DISORDERS Anxiety      Neuromuscular disease CVA, Residual Symptoms    GI/Hepatic   Endo/Other  diabetes    Renal/GU ESRF and DialysisRenal disease     Musculoskeletal   Abdominal   Peds  Hematology   Anesthesia Other Findings   Reproductive/Obstetrics                             Anesthesia Physical Anesthesia Plan  ASA: 3 and emergent  Anesthesia Plan:  General   Post-op Pain Management: Tylenol PO (pre-op)*   Induction: Intravenous  PONV Risk Score and Plan: 4 or greater and Ondansetron, Dexamethasone, Midazolam and Scopolamine patch - Pre-op  Airway Management Planned: Oral ETT and Video Laryngoscope Planned  Additional Equipment: None  Intra-op Plan:   Post-operative Plan: Extubation in OR  Informed Consent: I have reviewed the patients History and Physical, chart, labs and discussed the procedure including the risks, benefits and alternatives for the proposed anesthesia with the patient or authorized representative who has indicated his/her understanding and acceptance.     Dental advisory given, History available from chart only and Only emergency history available  Plan Discussed with: CRNA  Anesthesia Plan Comments:        Anesthesia Quick Evaluation

## 2023-05-04 NOTE — Progress Notes (Signed)
[facility] Progress Note    Interim History: Worsening fever, hypotension overnight. Taken to OR for diagnostic/exploratory lap. Noted adhesions RLQ twisting mesentery resulting in SB ischemia.   Subjective: Seen in PACU. She is minimally responsive. BP soft.      Objective Vitals:   05/04/23 0400 05/04/23 0419 05/04/23 0741 05/04/23 0800  BP: (!) 85/57  (!) 89/57 110/60  Pulse: 91  85 87  Resp: 18  15 (!) 21  Temp: 97.9 F (36.6 C)   (!) 97.5 F (36.4 C)  TempSrc: Axillary   Axillary  SpO2: 95%  99% 100%  Weight:  54.6 kg     Physical Exam General: Acutely ill appearing female in NAD Heart: RRR. SR on monitor.  Lungs: CTAB Abdomen: NG tube present. Midline incision, ostomy present.  Extremities: Bilateral BKA Dialysis Access: R AVF + T/B  Additional Objective Labs: Basic Metabolic Panel: Recent Labs  Lab 05/02/23 1041 05/03/23 1412 05/04/23 0229  NA 134* 130* 133*  K 4.2 4.6 3.7  CL 92* 89* 93*  CO2 27 23 24   GLUCOSE 149* 104* 130*  BUN 32* 45* 20  CREATININE 4.13* 5.49* 2.90*  CALCIUM 8.1* 8.9 8.7*   Liver Function Tests: Recent Labs  Lab 04/30/23 0447 05/01/23 1020  AST 21 26  ALT 16 18  ALKPHOS 92 72  BILITOT 0.7 0.9  PROT 6.9 6.2*  ALBUMIN 2.5* 2.1*   No results for input(s): "LIPASE", "AMYLASE" in the last 168 hours. CBC: Recent Labs  Lab 04/30/23 0447 05/01/23 1020 05/02/23 1041 05/03/23 1302 05/03/23 1412  WBC 9.7 10.1 13.1* 9.3 14.3*  NEUTROABS 7.9*  --   --  7.6 11.8*  HGB 10.1* 8.4* 8.4* 11.4* 9.5*  HCT 35.0* 27.6* 26.9* 36.2 29.4*  MCV 89.1 83.9 84.3 80.1 82.1  PLT 199 219 240 161 232   Blood Culture    Component Value Date/Time   SDES BLOOD BLOOD LEFT HAND AEROBIC BOTTLE ONLY 05/03/2023 1305   SPECREQUEST AEROBIC BOTTLE ONLY 05/03/2023 1305   CULT  05/03/2023 1305    NO GROWTH < 24 HOURS Performed at [facility], [address], [place], [place] [address]    REPTSTATUS PENDING 05/03/2023 1305     Cardiac Enzymes: No results for input(s): "CKTOTAL", "CKMB", "CKMBINDEX", "TROPONINI" in the last 168 hours. CBG: Recent Labs  Lab 05/03/23 1108 05/03/23 2031 05/03/23 2349 05/04/23 0357 05/04/23 0743  GLUCAP 90 99 101* 111* 140*   Iron Studies: No results for input(s): "IRON", "TIBC", "TRANSFERRIN", "FERRITIN" in the last 72 hours. @lablastinr3 @ Studies/Results: PERIPHERAL VASCULAR CATHETERIZATION  Result Date: 05/04/2023 See surgical note for result.  DG Abd 1 View  Result Date: 05/03/2023 CLINICAL DATA:  Abdomen pain EXAM: ABDOMEN - 1 VIEW COMPARISON:  CT 05/01/2023, radiograph 05/01/2023, CT 04/30/2023 FINDINGS: Slight increased small bowel distension compared to prior. Small bowel loops dilated up to 5.1 cm. Paucity of distal gas IMPRESSION: Slight increased small bowel distension compared to prior and consistent with bowel obstruction. Electronically Signed   By: Jasmine Pang M.D.   On: 05/03/2023 23:12   Medications:  [MAR Hold] anticoagulant sodium citrate     [MAR Hold] cefTRIAXone (ROCEPHIN)  IV 2 g (05/04/23 0255)   dextrose 45 mL/hr at 05/04/23 0400   [MAR Hold] metronidazole 500 mg (05/04/23 0134)    [MAR Hold] Chlorhexidine Gluconate Cloth  6 each Topical Q0600   [MAR Hold] heparin  5,000 Units Subcutaneous Q8H   [MAR Hold] insulin aspart  0-9 Units  Subcutaneous Q4H   [MAR Hold] insulin glargine-yfgn  5 Units Subcutaneous Daily   [MAR Hold] pantoprazole (PROTONIX) IV  40 mg Intravenous QHS     Dialysis Orders: [facility] MWF  3h  400/1.5  49kg   R AVF   -Heparin none - last OP HD 9/16, post wt 51kg, coming off 2- 4kg over the last 3 wks - mircera IV q 2 wks, last 9/13, due 9/27 - hectorol 4 mcg IV three times per week   Assessment/Plan: Abdominal pain - partial SBO per CT scan. CXR neg. Gen surg consulting. Condition worsened over night-to OR this AM for ex-lap. Noted adhesions twisting mesentery resulting in SB ischemia. Per primary/Gen  surgery.  Sepsis 2/2 to ischemic Colitis - Management per primary.  DMT1 - per pmd ESRD - on HD MWF. Next HD 05/07/2023 Hyperkalemia. Episodic. Resolved.  Volume - no vol excess on exam.  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  Follow hgb.  MBD ckd - CCa in range, No recent PO4. Cont IV vdra. Resume sensipar when eating.  PAD - bilat AKA  Nutrition-very low albumin. NPO.     Fransico Sciandra H. Amea Mcphail NP-C 05/04/2023, 12:15 PM  [employer] [phone]

## 2023-05-04 NOTE — Progress Notes (Signed)
Received a call from bedside RN regarding the patient having recurrent fevers with Tmax 103.  Tachycardic, with leukocytosis.    Presented at bedside.  Significant tenderness with mild palpation in the abdomen worse in lower quadrants.  Minimal hypoactive bowel sounds on exam.    Due to concern for potential peritonitis from acute ischemia/bowel obstruction IV antibiotics were started, Rocephin and IV Flagyl.  Peripheral blood cultures x 2 are in process.   Time: 15 minutes.

## 2023-05-04 NOTE — Transfer of Care (Signed)
Immediate Anesthesia Transfer of Care Note  Patient: [name]  Procedure(s) Performed: LAPAROSCOPY DIAGNOSTIC EXPLORATION LAPAROTOMY WITH ILEO-CAECAL RESECTION (Abdomen) COLOSTOMY (Right)  Patient Location: PACU  Anesthesia Type:General  Level of Consciousness: sedated  Airway & Oxygen Therapy: Patient Spontanous Breathing  Post-op Assessment: Report given to RN and Post -op Vital signs reviewed and stable  Post vital signs: Reviewed and stable  Last Vitals:  Vitals Value Taken Time  BP    Temp    Pulse    Resp    SpO2      Last Pain:  Vitals:   05/04/23 0800  TempSrc: Axillary  PainSc: Asleep      Patients Stated Pain Goal: 0 (05/03/23 2036)  Complications: No notable events documented.

## 2023-05-04 NOTE — Plan of Care (Signed)

## 2023-05-04 NOTE — Progress Notes (Signed)
[facility]  Date of Admission:  04/30/2023   Total days of inpatient antibiotics 3  Principal Problem:   SBO (small bowel obstruction) (HCC) Active Problems:   Ischemic disease of gut (HCC)          Assessment: [name] with hx of DM, ESRD on ihd, pvd and ext mesenteric arterial plaque , hx of mesentaric ischemia in 12/2022  admitted with : #fever with abd pain and SBO -Presented with abd pain and met sepsis -Suspect mesenteric/gut ischemia -Initially started on abx then held -PT had worseing pain on 9/20 , fevered->Xray abx on 9/20"Slight increased small bowel distension compared to prior and consistent with bowel obstruction." -General surgery consulted and Taken to OR today Recommendations: -Start ctx and metro for c/f peritonitis form gut ischemia/bowel obstruction -Follow blood Cx from 9/20 -Follow OR findings    Microbiology:   Antibiotics: Start ctx and metro Cultures: Blood 9/20   SUBJECTIVE: Resting in bed.  Interval: Tmax 103 overnight  Review of Systems: Review of Systems  All other systems reviewed and are negative.  Scheduled Meds:  Chlorhexidine Gluconate Cloth  6 each Topical Q0600   heparin  5,000 Units Subcutaneous Q8H   insulin aspart  0-9 Units Subcutaneous Q4H   insulin glargine-yfgn  5 Units Subcutaneous Daily   pantoprazole (PROTONIX) IV  40 mg Intravenous QHS   Continuous Infusions:  anticoagulant sodium citrate     cefTRIAXone (ROCEPHIN)  IV 2 g (05/04/23 0255)   dextrose 45 mL/hr at 05/04/23 0400   metronidazole 500 mg (05/04/23 0134)   PRN Meds:.acetaminophen **OR** acetaminophen, albuterol, alteplase, anticoagulant sodium citrate, bisacodyl, feeding supplement (NEPRO CARB STEADY), heparin, hydrALAZINE, HYDROcodone-acetaminophen, HYDROmorphone (DILAUDID) injection, lidocaine (PF), lidocaine-prilocaine, ondansetron **OR** ondansetron (ZOFRAN) IV, pentafluoroprop-tetrafluoroeth Allergies  Allergen  Reactions   Peanut-Containing Drug Products Anaphylaxis and Hives   Penicillins Rash and Other (See Comments)    Rash in 2008.  Tolerated cefazolin in 2020. No reaction listed on MAR.   Shellfish Allergy Swelling   Chocolate Hives    OBJECTIVE: Vitals:   05/04/23 0400 05/04/23 0419 05/04/23 0741 05/04/23 0800  BP: (!) 85/57  (!) 89/57 110/60  Pulse: 91  85 87  Resp: 18  15 (!) 21  Temp: 97.9 F (36.6 C)   (!) 97.5 F (36.4 C)  TempSrc: Axillary   Axillary  SpO2: 95%  99% 100%  Weight:  54.6 kg     Body mass index is 22.74 kg/m.  Physical Exam Constitutional:      Appearance: Normal appearance.  HENT:     Head: Normocephalic and atraumatic.     Right Ear: Tympanic membrane normal.     Left Ear: Tympanic membrane normal.     Nose: Nose normal.     Mouth/Throat:     Mouth: Mucous membranes are moist.  Eyes:     Extraocular Movements: Extraocular movements intact.     Conjunctiva/sclera: Conjunctivae normal.     Pupils: Pupils are equal, round, and reactive to light.  Cardiovascular:     Rate and Rhythm: Normal rate and regular rhythm.     Heart sounds: No murmur heard.    No friction rub. No gallop.  Pulmonary:     Effort: Pulmonary effort is normal.     Breath sounds: Normal breath sounds.  Abdominal:     General: Abdomen is flat.     Palpations: Abdomen is soft.     Comments: Abdominal wound badaged  Musculoskeletal:        General: Normal range of motion.  Skin:    General: Skin is warm and dry.  Neurological:     General: No focal deficit present.     Mental Status: She is alert and oriented to person, place, and time.  Psychiatric:        Mood and Affect: Mood normal.       Lab Results Lab Results  Component Value Date   WBC 14.3 (H) 05/03/2023   HGB 9.5 (L) 05/03/2023   HCT 29.4 (L) 05/03/2023   MCV 82.1 05/03/2023   PLT 232 05/03/2023    Lab Results  Component Value Date   CREATININE 2.90 (H) 05/04/2023   BUN 20 05/04/2023   NA 133 (L)  05/04/2023   K 3.7 05/04/2023   CL 93 (L) 05/04/2023   CO2 24 05/04/2023    Lab Results  Component Value Date   ALT 18 05/01/2023   AST 26 05/01/2023   ALKPHOS 72 05/01/2023   BILITOT 0.9 05/01/2023        Danelle Earthly, MD [facility] 05/04/2023, 10:30 AM  I have personally spent 52 minutes involved in face-to-face and non-face-to-face activities for this patient on the day of the visit. Professional time spent includes the following activities: Preparing to see the patient (review of tests), Obtaining and/or reviewing separately obtained history (admission/discharge record), Performing a medically appropriate examination and/or evaluation , Ordering medications/tests/procedures, referring and communicating with other health care professionals, Documenting clinical information in the EMR, Independently interpreting results (not separately reported), Communicating results to the patient/family/caregiver, Counseling and educating the patient/family/caregiver and Care coordination (not separately reported).

## 2023-05-04 NOTE — Op Note (Signed)
Preoperative diagnosis: Small bowel obstruction, bowel ischemia  Postoperative diagnosis: same   Procedure:  Diagnostic laparoscopy  Extended ileocecectomy End Ileostomy   Surgeon: Melody Haver, M.D.  Asst: Darnell Level, MD  Anesthesia: General   Indications for procedure: [name] is a 42 y.o. year old female with DM and PAD s/p bilateral amputations who presented with abdominal pain and had CT showing concern for distal mesenteric occlusion with small bowel obstruction.  She was offered an operation by general surgery but declined.  Overnight, her abdominal pain worsened and she became hypotensive.  I explained to her my concern that there was an intra-abdominal process causing her symptoms and that she was unlikely to get better without intervention.  I discussed a diagnostic laparoscopy but explained that there was a high likelihood of needing a laparotomy. I addressed all of her questions. Written consent was obtained.    Description of procedure: The patient was brought into the operative suite. Anesthesia was administered with General endotracheal anesthesia. WHO checklist was applied. The patient was then placed in the supine position. The area was prepped and draped in the usual sterile fashion.  I obtained access to the abdomen with a veress needle in the LUQ at Palmer's point.  Following aspiration of air and a positive saline drop test, the insufflation was connected and the abdomen brought to a pressure of .  An opti-view technique was used to place a 5-mm trocar into the LLQ.  A laparoscope was introduced and there was no signs of injury related to entry.  There was a significant amount of dead small bowel, and the decision was made to convert to a laparotomy.   At this point, my partner Dr. Gerrit Friends joined me given the complexity of the operation and the acuity of the patient's condition. We made a midline laparotomy with a #10 blade and carried down through the  subcutaneous tissue and fascia using electrocautery.  There was a large wad of small bowel that was stuck in the pelvis. We carefully began mobilizing the bowel and noted that there were multiple adhesions in the right lower quadrant that resulted in twisting of the small bowel mesentery. We freed the adhesions and inspected the bowel. There was a large segment of small bowel that was frankly dead, extending from the terminal ileum to the mid-ileum.  The colon and remainder of the small bowel appeared healthy.  The right colon was mobilized along the white line to allow for resection of all necrotic bowel.  A GIA-75 stapler was used to transect the bowel just distal to the cecum and in the mid-ileum.  The intervening mesentery was taken using the Ligasure device.  We irrigated with several liters of saline until the effluent ran clear.  We inspected for hemostasis.  We examined the bowel again and noted that there was well over 100-cm of remaining small bowel.  Given that the rest of the bowel appeared healthy and that the etiology appeared to be related to an obstruction and not mesenteric ischemia, the decision was made to mature an ileostomy and close the abdomen.  An elliptical incision was made in the RLQ of the abdomen and carried down through the subcutaneous tissue using electrocautery. A cruciate incision was made in the anterior rectus fascia and trephine for the ileostomy created.  The stapled end of the small bowel was brought through the opening and was able to reach without tension or compression.  The fascia was closed using running #1 PDS suture.  The skin was packed with moist kerlix and covered with an ABD.  We then proceeded to mature an end ileostomy in the RLQ using standard brooking technique.  The port site was closed with a single 4-0 monocryl suture and covered with dermabond. All suture and instrument counts were reported as correct x 2.   Findings: Adhesions in the RLQ leading to  twisting of the mesentery and resulting in necrosis of the distal small bowel   Specimen: Ileocecectomy  Implant: None   Blood loss: Minimal  Complications: None  Melody Haver, M.D. General Surgery [facility], [place]

## 2023-05-04 NOTE — Progress Notes (Signed)
Foley removed per protocol on 2C. Pt is ESRD, doesn't need foley at the moment.

## 2023-05-04 NOTE — Progress Notes (Signed)
Dr. Hillery Hunter called for NG tube suction orders. New order placed for Low-intermittent suction. I also verified the need for foley due to hospital MD stating pt did not need since she is ESRD. Dr. Hillery Hunter states pt was making urine during the case so foley was inserted. Foley left in place at this time.

## 2023-05-05 ENCOUNTER — Encounter (HOSPITAL_COMMUNITY): Payer: Self-pay | Admitting: General Surgery

## 2023-05-05 DIAGNOSIS — K56609 Unspecified intestinal obstruction, unspecified as to partial versus complete obstruction: Secondary | ICD-10-CM | POA: Diagnosis not present

## 2023-05-05 LAB — GLUCOSE, CAPILLARY
Glucose-Capillary: 100 mg/dL — ABNORMAL HIGH (ref 70–99)
Glucose-Capillary: 151 mg/dL — ABNORMAL HIGH (ref 70–99)
Glucose-Capillary: 152 mg/dL — ABNORMAL HIGH (ref 70–99)
Glucose-Capillary: 165 mg/dL — ABNORMAL HIGH (ref 70–99)
Glucose-Capillary: 176 mg/dL — ABNORMAL HIGH (ref 70–99)
Glucose-Capillary: 196 mg/dL — ABNORMAL HIGH (ref 70–99)

## 2023-05-05 LAB — CBC
HCT: 26.5 % — ABNORMAL LOW (ref 36.0–46.0)
Hemoglobin: 8.4 g/dL — ABNORMAL LOW (ref 12.0–15.0)
MCH: 25.2 pg — ABNORMAL LOW (ref 26.0–34.0)
MCHC: 31.7 g/dL (ref 30.0–36.0)
MCV: 79.6 fL — ABNORMAL LOW (ref 80.0–100.0)
Platelets: 190 10*3/uL (ref 150–400)
RBC: 3.33 MIL/uL — ABNORMAL LOW (ref 3.87–5.11)
RDW: 19.3 % — ABNORMAL HIGH (ref 11.5–15.5)
WBC: 15.5 10*3/uL — ABNORMAL HIGH (ref 4.0–10.5)
nRBC: 0 % (ref 0.0–0.2)

## 2023-05-05 LAB — URIC ACID: Uric Acid, Serum: 4.1 mg/dL (ref 2.5–7.1)

## 2023-05-05 LAB — CULTURE, BLOOD (ROUTINE X 2)
Culture: NO GROWTH
Culture: NO GROWTH
Special Requests: ADEQUATE

## 2023-05-05 LAB — LACTIC ACID, PLASMA: Lactic Acid, Venous: 1.4 mmol/L (ref 0.5–1.9)

## 2023-05-05 NOTE — Progress Notes (Signed)
[facility] Progress Note   Subjective: POD#1 seen in room, asking to be turned. Pain improved. HD tomorrow on schedule.   Objective Vitals:   05/04/23 2322 05/05/23 0409 05/05/23 0415 05/05/23 0800  BP: 128/73  (!) 95/59 (!) 99/59  Pulse:    82  Resp:    14  Temp: 98.1 F (36.7 C) 98.2 F (36.8 C) 98.1 F (36.7 C) 98.2 F (36.8 C)  TempSrc: Oral Oral Oral Rectal  SpO2:    93%  Weight:   53.6 kg    Physical Exam General: Acutely ill appearing female in NAD Heart: RRR. SR on monitor.  Lungs: CTAB Abdomen: NG tube present. Midline incision, RLQ ileostomy present.  Extremities: Bilateral BKA Dialysis Access: R AVF + T/B  Additional Objective Labs: Basic Metabolic Panel: Recent Labs  Lab 05/02/23 1041 05/03/23 1412 05/04/23 0229  NA 134* 130* 133*  K 4.2 4.6 3.7  CL 92* 89* 93*  CO2 27 23 24   GLUCOSE 149* 104* 130*  BUN 32* 45* 20  CREATININE 4.13* 5.49* 2.90*  CALCIUM 8.1* 8.9 8.7*   Liver Function Tests: Recent Labs  Lab 04/30/23 0447 05/01/23 1020  AST 21 26  ALT 16 18  ALKPHOS 92 72  BILITOT 0.7 0.9  PROT 6.9 6.2*  ALBUMIN 2.5* 2.1*   No results for input(s): "LIPASE", "AMYLASE" in the last 168 hours. CBC: Recent Labs  Lab 04/30/23 0447 05/01/23 1020 05/02/23 1041 05/03/23 1302 05/03/23 1412 05/05/23 0623  WBC 9.7 10.1 13.1* 9.3 14.3* 15.5*  NEUTROABS 7.9*  --   --  7.6 11.8*  --   HGB 10.1* 8.4* 8.4* 11.4* 9.5* 8.4*  HCT 35.0* 27.6* 26.9* 36.2 29.4* 26.5*  MCV 89.1 83.9 84.3 80.1 82.1 79.6*  PLT 199 219 240 161 232 190   Blood Culture    Component Value Date/Time   SDES BLOOD LEFT HAND 05/03/2023 1305   SPECREQUEST  05/03/2023 1305    AEROBIC BOTTLE ONLY Blood Culture results may not be optimal due to an inadequate volume of blood received in culture bottles   CULT  05/03/2023 1305    NO GROWTH 2 DAYS Performed at [facility], [address] [place], [place] [address]    REPTSTATUS PENDING 05/03/2023 1305     Cardiac Enzymes: No results for input(s): "CKTOTAL", "CKMB", "CKMBINDEX", "TROPONINI" in the last 168 hours. CBG: Recent Labs  Lab 05/04/23 1830 05/04/23 1954 05/04/23 2326 05/05/23 0413 05/05/23 0811  GLUCAP 187* 203* 175* 165* 152*   Iron Studies: No results for input(s): "IRON", "TIBC", "TRANSFERRIN", "FERRITIN" in the last 72 hours. @lablastinr3 @ Studies/Results: PERIPHERAL VASCULAR CATHETERIZATION  Result Date: 05/04/2023 See surgical note for result.  DG Abd 1 View  Result Date: 05/03/2023 CLINICAL DATA:  Abdomen pain EXAM: ABDOMEN - 1 VIEW COMPARISON:  CT 05/01/2023, radiograph 05/01/2023, CT 04/30/2023 FINDINGS: Slight increased small bowel distension compared to prior. Small bowel loops dilated up to 5.1 cm. Paucity of distal gas IMPRESSION: Slight increased small bowel distension compared to prior and consistent with bowel obstruction. Electronically Signed   By: Jasmine Pang M.D.   On: 05/03/2023 23:12   Medications:  anticoagulant sodium citrate     cefTRIAXone (ROCEPHIN)  IV 2 g (05/05/23 0312)   dextrose 45 mL/hr at 05/04/23 1400   metronidazole 500 mg (05/05/23 0143)    Chlorhexidine Gluconate Cloth  6 each Topical Q0600   heparin  5,000 Units Subcutaneous Q8H   insulin aspart  0-9 Units Subcutaneous Q4H  insulin glargine-yfgn  5 Units Subcutaneous Daily   pantoprazole (PROTONIX) IV  40 mg Intravenous QHS     Dialysis Orders: [facility] MWF  3h  400/1.5  49kg   R AVF   -Heparin none - last OP HD 9/16, post wt 51kg, coming off 2- 4kg over the last 3 wks - mircera IV q 2 wks, last 9/13, due 9/27 - hectorol 4 mcg IV three times per week   Assessment/Plan: Abdominal pain - partial SBO per CT scan. CXR neg. Gen surg consulting. Condition worsened over night 05/03/2023-to OR 05/04/2023 for ex-lap with extended ileo-cecectomy with end ileostomy POD1 today. Noted adhesions twisting mesentery resulting in SB ischemia. Per primary/Gen surgery.  Sepsis  2/2 to ischemic Colitis - Management per primary.  DMT1 - per pmd ESRD - on HD MWF. Next HD 05/06/2023 Hyperkalemia. Episodic. Resolved.  Volume - no vol excess on exam.  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  Follow hgb.  MBD ckd - CCa in range, No recent PO4. Cont IV vdra. Resume sensipar when eating.  PAD - bilat AKA  Nutrition-very low albumin. NPO.   [name] H. Tracey Weseman NP-C 05/05/2023, 9:03 AM  [employer] [phone]

## 2023-05-05 NOTE — Progress Notes (Signed)
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  Patient was found to be febrile, tachycardic, lactic acid 1.9 followed by 2.5, chest x-ray atelectasis versus infiltrate.  CT abdomen and pelvis with contrast shows evidence of a small bowel obstruction with dilated ileal segment up to 4 cm, sigmoid segment consistent with colitis, diffuse gastric wall thickening, cholelithiasis with mild chronic prominence of the common bile duct.  General surgery consulted and following concern for bowel ischemia.  On 9/21st patient spiked high fever, became hypotensive.  She was taken to the OR for diagnostic laparoscopy found to have a small bowel obstruction, bowel ischemia underwent extended ileocecectomy and end ileostomy by Dr. Hillery Hunter.     Assessment & Plan:   Principal Problem:   SBO (small bowel obstruction) (HCC) Active Problems:   Ischemic disease of gut (HCC)   1-Partial small bowel obstruction , Bowel ischemia.  -Patient presented with abdominal pain, lactic acidosis, CT abdomen and pelvis consistent with SBO and colitis -Appreciate general surgery recommendation plan to continue with n.p.o. status -C diff PCR negative, c diff antigen positive. ID consulted.  -CT abdomen and pelvis angio negative for complete occlusion.  -9/21: Spike fever 103. Hypotensive. IV antibiotics ceftriaxone, flagyl.  -Underwent  Diagnostic laparoscopy, extended Ileocecectomy and End Illeostomy 9/21. -NG tube in place, illeostomy bag with stool.  Management per sx.   Sepsis; Secondary to Ischemic Colitis;  Patient spike fever 103, hypotensive, tachycardic.  Started IV ceftriaxone and Flagyl.  Lactic acid ordered.  IV bolus.  CCM consulted. Support care. Hypotension resolved    Diabetes type 1: CBG 74.  She is currently NPO.  Stated low rate D5.  Semglee 5 mg   daily Monitor CBG B-met no acidosis gap normal.   Peripheral vascular disease status post bilateral BKA -Continue to hold statins while NPO  ESRD on hemodialysis Monday Wednesday and Friday, hyperkalemia Received HD 9/18. 9/20.   Essential Hypertension: Continue to hold blood pressure medications  Hyperlipidemia: Continue to hold statins Chronic anemia anemia of chronic disease: Continue to monitor hemoglobin   See wound care documentation below.   Pressure Injury 04/30/23 Sacrum Mid Stage 2 -  Partial thickness loss of dermis presenting as a shallow open injury with a red, pink wound bed without slough. 5 cm x 7 cm pink dry (Active)  04/30/23   Location: Sacrum  Location Orientation: Mid  Staging: Stage 2 -  Partial thickness loss of dermis presenting as a shallow open injury with a red, pink wound bed without slough.  Wound Description (Comments): 5 cm x 7 cm pink dry  Present on Admission: Yes  Dressing Type Foam - Lift dressing to assess site every shift 05/03/23 2036     Estimated body mass index is 22.33 kg/m as calculated from the following:   Height as of 03/17/23: 5\' 1"  (1.549 m).   Weight as of this encounter: 53.6 kg.   DVT prophylaxis: heparin  Code Status: full code Family Communication: SW to contact family.  Disposition Plan:  Status is: Inpatient Remains inpatient appropriate because: management of abdominal pain     Consultants:  Surgery Nephrology   Procedures:  HD  Antimicrobials:    Subjective: She  is feeling better, abdominal pain better.  Report right arm pain   Objective: Vitals:   05/04/23 2300 05/04/23 2322 05/05/23 0409 05/05/23 0415  BP:  128/73  (!) 95/59  Pulse:      Resp:      Temp:  98.1 F (36.7 C) 98.2 F (36.8 C) 98.1 F (36.7 C)  TempSrc: Oral Oral Oral Oral  SpO2:      Weight:    53.6 kg    Intake/Output Summary (Last 24 hours) at 05/05/2023 0724 Last data filed at 05/05/2023 2440 Gross per 24 hour  Intake 1800 ml  Output 1375 ml  Net 425 ml   Filed Weights   05/03/23 2008 05/04/23 0419 05/05/23 0415  Weight: (S) 53.8 kg 54.6 kg 53.6 kg    Examination:  General exam: NAD Respiratory system: CTA Cardiovascular system: S 1, S 2 RRR Gastrointestinal system: BS present, ostomy in place, bag with stool Central nervous system: Alert    Data Reviewed: I have personally reviewed following labs and imaging studies  CBC: Recent Labs  Lab 04/30/23 0447 05/01/23 1020 05/02/23 1041 05/03/23 1302 05/03/23 1412 05/05/23 0623  WBC 9.7 10.1 13.1* 9.3 14.3* 15.5*  NEUTROABS 7.9*  --   --  7.6 11.8*  --   HGB 10.1* 8.4* 8.4* 11.4* 9.5* 8.4*  HCT 35.0* 27.6* 26.9* 36.2 29.4* 26.5*  MCV 89.1 83.9 84.3 80.1 82.1 79.6*  PLT 199 219 240 161 232 190   Basic Metabolic Panel: Recent Labs  Lab 04/30/23 0447 05/01/23 1020 05/02/23 1041 05/03/23 1412 05/04/23 0229  NA 138 141 134* 130* 133*  K 4.8 6.2* 4.2 4.6 3.7  CL 94* 98 92* 89* 93*  CO2 26 25 27 23 24   GLUCOSE 228* 83 149* 104* 130*  BUN 46* 72* 32* 45* 20  CREATININE 5.74* 7.57* 4.13* 5.49* 2.90*  CALCIUM 9.0 8.3* 8.1* 8.9 8.7*   GFR: Estimated Creatinine Clearance: 19.3 mL/min (A) (by C-G formula based on SCr of 2.9 mg/dL (H)). Liver Function Tests: Recent Labs  Lab 04/30/23 0447 05/01/23 1020  AST 21 26  ALT 16 18  ALKPHOS 92 72  BILITOT 0.7 0.9  PROT 6.9 6.2*  ALBUMIN 2.5* 2.1*   No results for input(s): "LIPASE", "AMYLASE" in the last 168 hours. No results for input(s):  "AMMONIA" in the last 168 hours. Coagulation Profile: Recent Labs  Lab 04/30/23 0447  INR 1.3*   Cardiac Enzymes: No results for input(s): "CKTOTAL", "CKMB", "CKMBINDEX", "TROPONINI" in the last 168 hours. BNP (last 3 results) No results for input(s): "PROBNP" in the last 8760 hours. HbA1C: No results for input(s): "HGBA1C" in the last 72 hours.  CBG: Recent Labs  Lab 05/04/23 1340 05/04/23 1830 05/04/23 1954 05/04/23 2326 05/05/23 0413  GLUCAP 149* 187* 203* 175* 165*   Lipid Profile: No results for input(s): "CHOL", "HDL", "LDLCALC", "TRIG", "CHOLHDL", "LDLDIRECT" in the last 72 hours. Thyroid Function Tests: No results for input(s): "TSH", "T4TOTAL", "FREET4", "T3FREE", "THYROIDAB" in the last 72 hours. Anemia Panel: No results for input(s): "VITAMINB12", "FOLATE", "FERRITIN", "TIBC", "IRON", "RETICCTPCT" in the last 72 hours. Sepsis Labs: Recent Labs  Lab 05/01/23 1020 05/02/23 1041 05/02/23 1614 05/05/23 0623  PROCALCITON 32.25  --   --   --   LATICACIDVEN 2.3* 2.2* 1.9 1.4  Recent Results (from the past 240 hour(s))  Blood Culture (routine x 2)     Status: None (Preliminary result)   Collection Time: 04/30/23  3:56 AM   Specimen: BLOOD LEFT ARM  Result Value Ref Range Status   Specimen Description BLOOD LEFT ARM  Final   Special Requests   Final    BOTTLES DRAWN AEROBIC AND ANAEROBIC Blood Culture results may not be optimal due to an excessive volume of blood received in culture bottles   Culture   Final    NO GROWTH 4 DAYS Performed at [facility], [address], [place], [place] [address]    Report Status PENDING  Incomplete  Blood Culture (routine x 2)     Status: None (Preliminary result)   Collection Time: 04/30/23  3:56 AM   Specimen: BLOOD RIGHT ARM  Result Value Ref Range Status   Specimen Description BLOOD RIGHT ARM  Final   Special Requests   Final    BOTTLES DRAWN AEROBIC AND ANAEROBIC Blood Culture adequate volume   Culture    Final    NO GROWTH 4 DAYS Performed at [facility], [address] [place], [place] [address]    Report Status PENDING  Incomplete  SARS Coronavirus 2 by RT PCR (hospital order, performed in [facility] hospital lab) *cepheid single result test* Anterior Nasal Swab     Status: None   Collection Time: 04/30/23  6:50 AM   Specimen: Anterior Nasal Swab  Result Value Ref Range Status   SARS Coronavirus 2 by RT PCR NEGATIVE NEGATIVE Final    Comment: Performed at [facility], [address], [place], [place] [address]  Gastrointestinal Panel by PCR , Stool     Status: None   Collection Time: 05/02/23 12:06 AM   Specimen: Stool  Result Value Ref Range Status   Campylobacter species NOT DETECTED NOT DETECTED Final   Plesimonas shigelloides NOT DETECTED NOT DETECTED Final   Salmonella species NOT DETECTED NOT DETECTED Final   Yersinia enterocolitica NOT DETECTED NOT DETECTED Final   Vibrio species NOT DETECTED NOT DETECTED Final   Vibrio cholerae NOT DETECTED NOT DETECTED Final   Enteroaggregative E coli (EAEC) NOT DETECTED NOT DETECTED Final   Enteropathogenic E coli (EPEC) NOT DETECTED NOT DETECTED Final   Enterotoxigenic E coli (ETEC) NOT DETECTED NOT DETECTED Final   Shiga like toxin producing E coli (STEC) NOT DETECTED NOT DETECTED Final   Shigella/Enteroinvasive E coli (EIEC) NOT DETECTED NOT DETECTED Final   Cryptosporidium NOT DETECTED NOT DETECTED Final   Cyclospora cayetanensis NOT DETECTED NOT DETECTED Final   Entamoeba histolytica NOT DETECTED NOT DETECTED Final   Giardia lamblia NOT DETECTED NOT DETECTED Final   Adenovirus F40/41 NOT DETECTED NOT DETECTED Final   Astrovirus NOT DETECTED NOT DETECTED Final   Norovirus GI/GII NOT DETECTED NOT DETECTED Final   Rotavirus A NOT DETECTED NOT DETECTED Final   Sapovirus (I, II, IV, and V) NOT DETECTED NOT DETECTED Final    Comment: Performed at [facility], [address] [place], [place] [address]  C  Difficile Quick Screen w PCR reflex     Status: Abnormal   Collection Time: 05/02/23 12:06 AM   Specimen: STOOL  Result Value Ref Range Status   C Diff antigen POSITIVE (A) NEGATIVE Final   C Diff toxin NEGATIVE NEGATIVE Final   C Diff interpretation Results are indeterminate. See PCR results.  Final    Comment: Performed at [facility], [address] [place], [place] [address]  C. Diff by PCR, Reflexed     Status: None   Collection Time: 05/02/23 12:06 AM  Result Value Ref Range Status   Toxigenic C. Difficile by PCR NEGATIVE NEGATIVE Final    Comment: Patient is colonized with non toxigenic C. difficile. May not need treatment unless significant symptoms are present. Performed at [facility], [address] [place], [place] [address]   Culture, blood (Routine X 2) w Reflex to ID Panel     Status: None (Preliminary result)   Collection Time: 05/03/23  1:02 PM   Specimen: BLOOD  Result Value Ref Range Status   Specimen Description BLOOD BLOOD LEFT HAND  Final   Special Requests BLOOD AEROBIC BOTTLE Blood Culture adequate volume  Final   Culture   Final    NO GROWTH < 24 HOURS Performed at [facility], [address], [place], [place] [address]    Report Status PENDING  Incomplete  Culture, blood (Routine X 2) w Reflex to ID Panel     Status: None (Preliminary result)   Collection Time: 05/03/23  1:05 PM   Specimen: BLOOD  Result Value Ref Range Status   Specimen Description BLOOD BLOOD LEFT HAND AEROBIC BOTTLE ONLY  Final   Special Requests AEROBIC BOTTLE ONLY  Final   Culture   Final    NO GROWTH < 24 HOURS Performed at [facility], [address], [place], [place] [address]    Report Status PENDING  Incomplete         Radiology Studies: PERIPHERAL VASCULAR CATHETERIZATION  Result Date: 05/04/2023 See surgical note for result.  DG Abd 1 View  Result Date: 05/03/2023 CLINICAL DATA:  Abdomen pain EXAM: ABDOMEN - 1 VIEW COMPARISON:  CT  05/01/2023, radiograph 05/01/2023, CT 04/30/2023 FINDINGS: Slight increased small bowel distension compared to prior. Small bowel loops dilated up to 5.1 cm. Paucity of distal gas IMPRESSION: Slight increased small bowel distension compared to prior and consistent with bowel obstruction. Electronically Signed   By: Jasmine Pang M.D.   On: 05/03/2023 23:12        Scheduled Meds:  Chlorhexidine Gluconate Cloth  6 each Topical Q0600   heparin  5,000 Units Subcutaneous Q8H   insulin aspart  0-9 Units Subcutaneous Q4H   insulin glargine-yfgn  5 Units Subcutaneous Daily   pantoprazole (PROTONIX) IV  40 mg Intravenous QHS   Continuous Infusions:  anticoagulant sodium citrate     cefTRIAXone (ROCEPHIN)  IV 2 g (05/05/23 0312)   dextrose 45 mL/hr at 05/04/23 1400   metronidazole 500 mg (05/05/23 0143)     LOS: 5 days    Time spent: 35 minutes    Scarleth Brame A Lizann Edelman, MD [facility]   If 7PM-7AM, please contact night-coverage [email or web address]  05/05/2023, 7:24 AM

## 2023-05-05 NOTE — Progress Notes (Signed)
1 Day Post-Op  Subjective: Says her pain is better, said she is not really sore but she has been laying still. Asking to eat.    Objective: Vital signs in last 24 hours: Temp:  [97.4 F (36.3 C)-98.2 F (36.8 C)] 98.1 F (36.7 C) (09/22 0415) Pulse Rate:  [87-98] 90 (09/21 1800) Resp:  [13-21] 18 (09/21 1315) BP: (95-131)/(59-75) 95/59 (09/22 0415) SpO2:  [97 %-100 %] 100 % (09/21 1800) Weight:  [53.6 kg] 53.6 kg (09/22 0415) Last BM Date : 05/02/23  Intake/Output from previous day: 09/21 0701 - 09/22 0700 In: 1800 [I.V.:900; IV Piggyback:900] Out: 1375 [Urine:60; Emesis/NG output:815; Stool:200; Blood:50] Intake/Output this shift: No intake/output data recorded.  PE: General: alert Neuro: alert and oriented, no focal deficits Resp: normal work of breathing on room air Abdomen: soft, midline dressing c/d/I (she refused to let me look at her midline incision), RLQ ileostomy w/ pink viable stoma, small amt liquid stool in pouch.   Lab Results:  Recent Labs    05/03/23 1412 05/05/23 0623  WBC 14.3* 15.5*  HGB 9.5* 8.4*  HCT 29.4* 26.5*  PLT 232 190   BMET Recent Labs    05/03/23 1412 05/04/23 0229  NA 130* 133*  K 4.6 3.7  CL 89* 93*  CO2 23 24  GLUCOSE 104* 130*  BUN 45* 20  CREATININE 5.49* 2.90*  CALCIUM 8.9 8.7*   PT/INR No results for input(s): "LABPROT", "INR" in the last 72 hours.  CMP     Component Value Date/Time   NA 133 (L) 05/04/2023 0229   NA 133 (L) 05/18/2020 0927   K 3.7 05/04/2023 0229   CL 93 (L) 05/04/2023 0229   CO2 24 05/04/2023 0229   GLUCOSE 130 (H) 05/04/2023 0229   BUN 20 05/04/2023 0229   BUN 45 (H) 05/18/2020 0927   CREATININE 2.90 (H) 05/04/2023 0229   CALCIUM 8.7 (L) 05/04/2023 0229   CALCIUM 12.1 (H) 11/13/2021 2026   PROT 6.2 (L) 05/01/2023 1020   PROT 7.6 05/18/2020 0927   ALBUMIN 2.1 (L) 05/01/2023 1020   ALBUMIN 3.9 05/18/2020 0927   AST 26 05/01/2023 1020   ALT 18 05/01/2023 1020   ALKPHOS 72  05/01/2023 1020   BILITOT 0.9 05/01/2023 1020   BILITOT <0.2 05/18/2020 0927   GFRNONAA 20 (L) 05/04/2023 0229   GFRAA 6 (L) 05/18/2020 0927   Lipase     Component Value Date/Time   LIPASE 40 12/20/2022 0918     Assessment/Plan 42 yo female presenting with abdominal pain, diarrhea, low grade fevers, dilated small bowel and colonic wall thickening on CT  s/p diagnostic lap converted to ex-lap with extended ileo-cecectomy (mid-ileum to just past cecum), 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 Hillery Hunter Intra-op findings were notable for adhesions in the RLQ w/ associated twisting of the mesentery resulting in SB ischemia.  - POD#1 - AFVSS, WBC 15.5 from 14.3, hgb 8.4 - NGT 815 cc/24h, continue LIWS - consult WOC RN for new ileostomy - wound care orders for BID moist-to-dry dressing placed   FEN: NPO, Ice chips and a popsicle ok, IVF per primary, NG to LIWS - may be able to remove NG soon but would like to see more ileostomy output.  ID: Rocephin/Flagyl 9/21 >> tentative plan to stop abx 9/25 @ 2359 VTE: SCDs, SQH q 8h Foley: removed last night Dispo: progressive care    LOS: 5 days   Hosie Spangle, [facility] Please see Amion for pager number during  day hours 7:00am-4:30pm        05/05/23 7:44 AM

## 2023-05-05 NOTE — Plan of Care (Signed)

## 2023-05-05 NOTE — Plan of Care (Signed)
  Problem: Health Behavior/Discharge Planning: Goal: Ability to identify and utilize available resources and services will improve Outcome: Progressing Goal: Ability to manage health-related needs will improve Outcome: Progressing   Problem: Nutritional: Goal: Maintenance of adequate nutrition will improve Outcome: Progressing   Problem: Skin Integrity: Goal: Risk for impaired skin integrity will decrease Outcome: Progressing   Problem: Skin Integrity: Goal: Risk for impaired skin integrity will decrease Outcome: Progressing

## 2023-05-06 DIAGNOSIS — K56609 Unspecified intestinal obstruction, unspecified as to partial versus complete obstruction: Secondary | ICD-10-CM | POA: Diagnosis not present

## 2023-05-06 LAB — RENAL FUNCTION PANEL
Albumin: 1.8 g/dL — ABNORMAL LOW (ref 3.5–5.0)
Anion gap: 13 (ref 5–15)
BUN: 51 mg/dL — ABNORMAL HIGH (ref 6–20)
CO2: 22 mmol/L (ref 22–32)
Calcium: 7.7 mg/dL — ABNORMAL LOW (ref 8.9–10.3)
Chloride: 94 mmol/L — ABNORMAL LOW (ref 98–111)
Creatinine, Ser: 4.78 mg/dL — ABNORMAL HIGH (ref 0.44–1.00)
GFR, Estimated: 11 mL/min — ABNORMAL LOW (ref >60)
Glucose, Bld: 132 mg/dL — ABNORMAL HIGH (ref 70–99)
Phosphorus: 4.3 mg/dL (ref 2.5–4.6)
Potassium: 3.7 mmol/L (ref 3.5–5.1)
Sodium: 129 mmol/L — ABNORMAL LOW (ref 135–145)

## 2023-05-06 LAB — GLUCOSE, CAPILLARY
Glucose-Capillary: 103 mg/dL — ABNORMAL HIGH (ref 70–99)
Glucose-Capillary: 104 mg/dL — ABNORMAL HIGH (ref 70–99)
Glucose-Capillary: 110 mg/dL — ABNORMAL HIGH (ref 70–99)
Glucose-Capillary: 111 mg/dL — ABNORMAL HIGH (ref 70–99)
Glucose-Capillary: 111 mg/dL — ABNORMAL HIGH (ref 70–99)
Glucose-Capillary: 95 mg/dL (ref 70–99)
Glucose-Capillary: 98 mg/dL (ref 70–99)

## 2023-05-06 LAB — CBC
HCT: 23.1 % — ABNORMAL LOW (ref 36.0–46.0)
Hemoglobin: 7.4 g/dL — ABNORMAL LOW (ref 12.0–15.0)
MCH: 26.2 pg (ref 26.0–34.0)
MCHC: 32 g/dL (ref 30.0–36.0)
MCV: 81.9 fL (ref 80.0–100.0)
Platelets: 230 10*3/uL (ref 150–400)
RBC: 2.82 MIL/uL — ABNORMAL LOW (ref 3.87–5.11)
RDW: 19.3 % — ABNORMAL HIGH (ref 11.5–15.5)
WBC: 14.4 10*3/uL — ABNORMAL HIGH (ref 4.0–10.5)
nRBC: 0.3 % — ABNORMAL HIGH (ref 0.0–0.2)

## 2023-05-06 MED ORDER — HEPARIN SODIUM (PORCINE) 1000 UNIT/ML DIALYSIS: 1000.0000 [IU] | INTRAMUSCULAR | Status: DC | PRN

## 2023-05-06 NOTE — Progress Notes (Signed)
Received patient in bed to unit.  Alert and oriented.  Informed consent signed and in chart.   TX duration:2:11  Patient tolerated well.  Transported back to the room  Alert, without acute distress.  Hand-off given to patient's nurse.   Access used: AVF Access issues: NO  Total UF removed:0.8L Medication(s) given: N/A   05/06/23 1609  Vitals  ECG Heart Rate 89  Resp (!) 7  During Treatment Monitoring  Blood Flow Rate (mL/min) 199 mL/min  Arterial Pressure (mmHg) -77.17 mmHg  Venous Pressure (mmHg) 158.78 mmHg  TMP (mmHg) 0.8 mmHg  Ultrafiltration Rate (mL/min) 649 mL/min  Dialysate Flow Rate (mL/min) 300 ml/min  Dialysate Potassium Concentration 3  Dialysate Calcium Concentration 2.5  Duration of HD Treatment -hour(s) 2.18 hour(s)  Cumulative Fluid Removed (mL) per Treatment  844.37  Intra-Hemodialysis Comments Tx completed  Post Treatment  Dialyzer Clearance Lightly streaked  Liters Processed 52.3  Fluid Removed (mL) 0.8 mL  Tolerated HD Treatment Yes  AVG/AVF Arterial Site Held (minutes) 10 minutes  AVG/AVF Venous Site Held (minutes) 10 minutes  Fistula / Graft Right Upper arm Arteriovenous fistula  Placement Date/Time: 02/09/22 1200   Placed prior to admission: Yes  Orientation: Right  Access Location: Upper arm  Access Type: Arteriovenous fistula  Site Condition No complications  Fistula / Graft Assessment Present;Thrill;Bruit  Status Deaccessed  Drainage Description None (New dressing applied note to be clean, dry and intact.)       [facility]

## 2023-05-06 NOTE — Progress Notes (Signed)
    2 Days Post-Op  Subjective: Having some pain. No n/v.    Objective: Vital signs in last 24 hours: Temp:  [97.6 F (36.4 C)-98.5 F (36.9 C)] 98.4 F (36.9 C) (09/23 0700) Pulse Rate:  [73-93] 80 (09/23 0741) Resp:  [12-19] 14 (09/23 0741) BP: (85-122)/(55-75) 115/64 (09/23 0741) SpO2:  [95 %-100 %] 100 % (09/23 0741) Weight:  [54.1 kg] 54.1 kg (09/23 0400) Last BM Date : 05/06/23  Intake/Output from previous day: 09/22 0701 - 09/23 0700 In: 50 [P.O.:50] Out: 125 [Stool:125] Intake/Output this shift: No intake/output data recorded.  PE: General: alert Neuro: alert and oriented, no focal deficits Resp: normal work of breathing on room air Abdomen: soft, midline dressing c/d/I  RLQ ileostomy w/ pink viable stoma, some liquid stool and air in pouch.   Lab Results:  Recent Labs    05/03/23 1412 05/05/23 0623  WBC 14.3* 15.5*  HGB 9.5* 8.4*  HCT 29.4* 26.5*  PLT 232 190   BMET Recent Labs    05/03/23 1412 05/04/23 0229  NA 130* 133*  K 4.6 3.7  CL 89* 93*  CO2 23 24  GLUCOSE 104* 130*  BUN 45* 20  CREATININE 5.49* 2.90*  CALCIUM 8.9 8.7*   PT/INR No results for input(s): "LABPROT", "INR" in the last 72 hours.  CMP     Component Value Date/Time   NA 133 (L) 05/04/2023 0229   NA 133 (L) 05/18/2020 0927   K 3.7 05/04/2023 0229   CL 93 (L) 05/04/2023 0229   CO2 24 05/04/2023 0229   GLUCOSE 130 (H) 05/04/2023 0229   BUN 20 05/04/2023 0229   BUN 45 (H) 05/18/2020 0927   CREATININE 2.90 (H) 05/04/2023 0229   CALCIUM 8.7 (L) 05/04/2023 0229   CALCIUM 12.1 (H) 11/13/2021 2026   PROT 6.2 (L) 05/01/2023 1020   PROT 7.6 05/18/2020 0927   ALBUMIN 2.1 (L) 05/01/2023 1020   ALBUMIN 3.9 05/18/2020 0927   AST 26 05/01/2023 1020   ALT 18 05/01/2023 1020   ALKPHOS 72 05/01/2023 1020   BILITOT 0.9 05/01/2023 1020   BILITOT <0.2 05/18/2020 0927   GFRNONAA 20 (L) 05/04/2023 0229   GFRAA 6 (L) 05/18/2020 0927   Lipase     Component Value Date/Time    LIPASE 40 12/20/2022 0918     Assessment/Plan 42 yo female presenting with abdominal pain, diarrhea, low grade fevers, dilated small bowel and colonic wall thickening on CT  s/p diagnostic lap converted to ex-lap with extended ileo-cecectomy (mid-ileum to just past cecum), 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 Hillery Hunter Intra-op findings were notable for adhesions in the RLQ w/ associated twisting of the mesentery resulting in SB ischemia.  - POD#2 - NGT output down, d/c ngt - consult WOC RN for new ileostomy - BID moist-to-dry dressing   FEN: d/c ngt, clear liq.  ID: Rocephin/Flagyl 9/21 >> tentative plan to stop abx 9/25 @ 2359 VTE: SCDs, SQH q 8h Foley: out Dispo: progressive care    LOS: 6 days   Tracey Diego, MD, FACS, FSSO Surgical Oncology, General Surgery, Trauma and [facility], [place] [phone] for weekday/non holidays Check [email or web address] for coverage night/weekend/holidays         05/06/23 10:18 AM

## 2023-05-06 NOTE — Plan of Care (Signed)

## 2023-05-06 NOTE — Consult Note (Signed)
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  WIll benefit from presized pouches.  She lives alone as her husband has "moved out of town"  Unclear on exact situation but she states she is " on her own " for care.  Stoma type/location: RMQ ileostomy Stomal assessment/size: 1:, slightly budded Peristomal assessment: intact  midline incision Treatment options for stomal/peristomal skin: barrier ring and 1 piece convex.  2 piece was leaking beneath barrier and creeping towards open incision Output liquid brown stool Ostomy pouching: 1pc. Convex with barrier ring Education provided: Patient is visually impaired but states she can see out of one eye.  We discuss twice weekly pouch changes and emptying when 1/7full.  She will benefit from presized pouches once discharge and stoma has matured and sized.  I perform pouch change, explaining rationale for barrier ring and convex barrier.  She is attentive but remains groggy.  Her oxygen saturation drops and comes back up when encouraged to breathe deeply.   Enrolled patient in [employer] DC program: Yes will today.  By the conclusion of visit, patient states that her husband will have to come home and care for her. I encourage her to ask him to arrange a time to come by for teaching.  Will follow.  Mike Gip MSN, RN, FNP-BC CWON Wound, Ostomy, Continence Nurse Outpatient [facility] [phone] Pager [phone]

## 2023-05-06 NOTE — Progress Notes (Signed)
[facility] NEPHROLOGY PROGRESS NOTE  OP HD orders. Dialysis Orders: [facility] MWF  3h  400/1.5  49kg   R AVF   -Heparin none - last OP HD 9/16, post wt 51kg, coming off 2- 4kg over the last 3 wks - mircera IV q 2 wks, last 9/13, due 9/27 - hectorol 4 mcg IV three times per week.   Assessment/ Plan: # Abdominal pain - partial SBO s/p ex-lap with extended ileo-cecectomy with end ileostomy. Per primary/Gen surgery.   #Sepsis 2/2 to ischemic Colitis - Management per primary.   # ESRD - on HD MWF. Next HD 05/06/2023.  #Hyperkalemia. Resolved.   # HTN/Volume - no vol excess on exam. UF wth HD.   # Anemia due to ESRD - Hgb 8.4  ESA due 9/27.  Follow hgb.   # CKD-MBD - CCa in range, No recent PO4. Cont IV vdra. Resume sensipar when eating.   #PAD - bilat AKA.   Subjective: Seen and examined at the bedside.  Patient was confused.  Denies nausea, vomiting, chest pain or shortness of breath.  Discussed with the nurses. Objective Vital signs in last 24 hours: Vitals:   05/05/23 2300 05/06/23 0400 05/06/23 0700 05/06/23 0741  BP: 90/61 102/61 105/62 115/64  Pulse: 76 73 78 80  Resp: 15 16 12 14   Temp: 97.6 F (36.4 C) 98.4 F (36.9 C) 98.4 F (36.9 C)   TempSrc: Oral Oral Oral   SpO2: 100% 99% 99% 100%  Weight:  54.1 kg     Weight change: 0.5 kg  Intake/Output Summary (Last 24 hours) at 05/06/2023 1005 Last data filed at 05/05/2023 2000 Gross per 24 hour  Intake 50 ml  Output 125 ml  Net -75 ml       Labs: RENAL PANEL Recent Labs  Lab 04/30/23 0447 05/01/23 1020 05/02/23 1041 05/03/23 1412 05/04/23 0229  NA 138 141 134* 130* 133*  K 4.8 6.2* 4.2 4.6 3.7  CL 94* 98 92* 89* 93*  CO2 26 25 27 23 24   GLUCOSE 228* 83 149* 104* 130*  BUN 46* 72* 32* 45* 20  CREATININE 5.74* 7.57* 4.13* 5.49* 2.90*  CALCIUM 9.0 8.3* 8.1* 8.9 8.7*  ALBUMIN 2.5* 2.1*  --   --   --     Liver Function Tests: Recent Labs  Lab 04/30/23 0447 05/01/23 1020   AST 21 26  ALT 16 18  ALKPHOS 92 72  BILITOT 0.7 0.9  PROT 6.9 6.2*  ALBUMIN 2.5* 2.1*   No results for input(s): "LIPASE", "AMYLASE" in the last 168 hours. No results for input(s): "AMMONIA" in the last 168 hours. CBC: Recent Labs    07/25/22 0532 07/26/22 0654 05/01/23 1020 05/02/23 1041 05/03/23 1302 05/03/23 1412 05/05/23 0623  HGB 9.8*   < > 8.4* 8.4* 11.4* 9.5* 8.4*  MCV 85.3   < > 83.9 84.3 80.1 82.1 79.6*  FERRITIN 562*  --   --   --   --   --   --   TIBC 179*  --   --   --   --   --   --   IRON 56  --   --   --   --   --   --    < > = values in this interval not displayed.    Cardiac Enzymes: No results for input(s): "CKTOTAL", "CKMB", "CKMBINDEX", "TROPONINI" in the last 168 hours. CBG: Recent Labs  Lab 05/05/23 1621 05/05/23 1958  05/05/23 2352 05/06/23 0416 05/06/23 0740  GLUCAP 151* 176* 100* 95 104*    Iron Studies: No results for input(s): "IRON", "TIBC", "TRANSFERRIN", "FERRITIN" in the last 72 hours. Studies/Results: PERIPHERAL VASCULAR CATHETERIZATION  Result Date: 05/04/2023 See surgical note for result.   Medications: Infusions:  anticoagulant sodium citrate     cefTRIAXone (ROCEPHIN)  IV 2 g (05/06/23 0203)   dextrose 45 mL/hr at 05/06/23 0904   metronidazole 500 mg (05/06/23 0206)    Scheduled Medications:  Chlorhexidine Gluconate Cloth  6 each Topical Q0600   heparin  5,000 Units Subcutaneous Q8H   insulin aspart  0-9 Units Subcutaneous Q4H   insulin glargine-yfgn  5 Units Subcutaneous Daily   pantoprazole (PROTONIX) IV  40 mg Intravenous QHS    have reviewed scheduled and prn medications.  Physical Exam: General: Confused female, lying on bed. Heart:RRR, s1s2 nl Lungs:clear b/l, no crackle Abdomen: NG tube present, right lower quadrant ileostomy. Extremities: Bilateral BKA Dialysis Access: Right AV fistula has thrill and bruit.  Sherwin Hollingshed Prasad Erleen Egner 05/06/2023,10:05 AM  LOS: 6 days

## 2023-05-06 NOTE — Progress Notes (Signed)
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  Patient was found to be febrile, tachycardic, lactic acid 1.9 followed by 2.5, chest x-ray atelectasis versus infiltrate.  CT abdomen and pelvis with contrast shows evidence of a small bowel obstruction with dilated ileal segment up to 4 cm, sigmoid segment consistent with colitis, diffuse gastric wall thickening, cholelithiasis with mild chronic prominence of the common bile duct.  General surgery consulted and following concern for bowel ischemia.  On 9/21st patient spiked high fever, became hypotensive.  She was taken to the OR for diagnostic laparoscopy found to have a small bowel obstruction, bowel ischemia underwent extended ileocecectomy and end ileostomy by Dr. Hillery Hunter.     Assessment & Plan:   Principal Problem:   SBO (small bowel obstruction) (HCC) Active Problems:   Ischemic disease of gut (HCC)   1-Partial small bowel obstruction , Bowel ischemia. SP extended Ileocecectomy and End Illeostomy 9/21. -Patient presented with abdominal pain, lactic acidosis, CT abdomen and pelvis consistent with SBO and colitis -Appreciate general surgery recommendation plan to continue with n.p.o. status -C diff PCR negative, c diff antigen positive. ID consulted.  -CT abdomen and pelvis angio negative for complete occlusion.  -9/21: Spike fever 103. Hypotensive.  IV antibiotics ceftriaxone, flagyl.  -Underwent Diagnostic laparoscopy, extended Ileocecectomy and End Illeostomy 9/21. -Management per sx. Started on clear, NG tube removed.   Sepsis; Secondary to Ischemic Colitis;  Patient spike fever 103, Hypotensive, Tachycardic.  Continue with IV ceftriaxone and Flagyl.  CCM consulted. Support care. Hypotension resolved  BP normalized.   Diabetes type 1: CBG 74.  She is currently NPO.  Stated low rate D5.  Semglee 5 mg   daily Monitor CBG B-met no acidosis gap normal.  Started on clear diet. Monitor CBG, might need to discontinue D 5  Peripheral vascular disease status post bilateral BKA -Continue to hold statins, resume when tolerates diet.   ESRD on hemodialysis Monday Wednesday and Friday, hyperkalemia Received HD 9/18. 9/20.   Essential Hypertension: Continue to hold blood pressure medications  Hyperlipidemia: Continue to hold statins Chronic anemia anemia of chronic disease: Continue to monitor hemoglobin   See wound care documentation below.   Pressure Injury 04/30/23 Sacrum Mid Stage 2 -  Partial thickness loss of dermis presenting as a shallow open injury with a red, pink wound bed without slough. 5 cm x 7 cm pink dry (Active)  04/30/23   Location: Sacrum  Location Orientation: Mid  Staging: Stage 2 -  Partial thickness loss of dermis presenting as a shallow open injury with a red, pink wound bed without slough.  Wound Description (Comments): 5 cm x 7 cm pink dry  Present on Admission: Yes  Dressing Type Foam - Lift dressing to assess site every shift 05/06/23 0741     Estimated body mass index is 22.54 kg/m as calculated from the following:   Height as of 03/17/23: 5\' 1"  (1.549 m).   Weight as of this encounter: 54.1 kg.   DVT  prophylaxis: heparin  Code Status: full code Family Communication: SW to contact family.  Disposition Plan:  Status is: Inpatient Remains inpatient appropriate because: management of abdominal  pain     Consultants:  Surgery Nephrology   Procedures:  HD  Antimicrobials:    Subjective: She is alert, report abdominal pain. Feeling a little better.    Objective: Vitals:   05/06/23 0400 05/06/23 0700 05/06/23 0741 05/06/23 1100  BP: 102/61 105/62 115/64 139/70  Pulse: 73 78 80 84  Resp: 16 12 14 15   Temp: 98.4 F (36.9 C) 98.4 F (36.9 C)  97.9 F (36.6 C)  TempSrc: Oral Oral  Oral  SpO2: 99% 99% 100% 93%  Weight: 54.1 kg       Intake/Output Summary (Last 24 hours) at 05/06/2023 1336 Last data filed at 05/06/2023 1125 Gross per 24 hour  Intake 3587.38 ml  Output 125 ml  Net 3462.38 ml   Filed Weights   05/04/23 0419 05/05/23 0415 05/06/23 0400  Weight: 54.6 kg 53.6 kg 54.1 kg    Examination:  General exam: NAD Respiratory system: CTA Cardiovascular system: S1, S 2 RRR Gastrointestinal system: BS present, soft, ostomy in place, bag with stool Central nervous system: alert   Data Reviewed: I have personally reviewed following labs and imaging studies  CBC: Recent Labs  Lab 04/30/23 0447 05/01/23 1020 05/02/23 1041 05/03/23 1302 05/03/23 1412 05/05/23 0623  WBC 9.7 10.1 13.1* 9.3 14.3* 15.5*  NEUTROABS 7.9*  --   --  7.6 11.8*  --   HGB 10.1* 8.4* 8.4* 11.4* 9.5* 8.4*  HCT 35.0* 27.6* 26.9* 36.2 29.4* 26.5*  MCV 89.1 83.9 84.3 80.1 82.1 79.6*  PLT 199 219 240 161 232 190   Basic Metabolic Panel: Recent Labs  Lab 04/30/23 0447 05/01/23 1020 05/02/23 1041 05/03/23 1412 05/04/23 0229  NA 138 141 134* 130* 133*  K 4.8 6.2* 4.2 4.6 3.7  CL 94* 98 92* 89* 93*  CO2 26 25 27 23 24   GLUCOSE 228* 83 149* 104* 130*  BUN 46* 72* 32* 45* 20  CREATININE 5.74* 7.57* 4.13* 5.49* 2.90*  CALCIUM 9.0 8.3* 8.1* 8.9 8.7*   GFR: Estimated Creatinine Clearance: 19.3 mL/min (A) (by C-G formula based on SCr of 2.9 mg/dL (H)). Liver Function Tests: Recent Labs  Lab 04/30/23 0447 05/01/23 1020  AST 21 26  ALT 16 18  ALKPHOS 92 72  BILITOT 0.7  0.9  PROT 6.9 6.2*  ALBUMIN 2.5* 2.1*   No results for input(s): "LIPASE", "AMYLASE" in the last 168 hours. No results for input(s): "AMMONIA" in the last 168 hours. Coagulation Profile: Recent Labs  Lab 04/30/23 0447  INR 1.3*   Cardiac Enzymes: No results for input(s): "CKTOTAL", "CKMB", "CKMBINDEX", "TROPONINI" in the last 168 hours. BNP (last 3 results) No results for input(s): "PROBNP" in the last 8760 hours. HbA1C: No results for input(s): "HGBA1C" in the last 72 hours.  CBG: Recent Labs  Lab 05/05/23 1958 05/05/23 2352 05/06/23 0416 05/06/23 0740 05/06/23 1055  GLUCAP 176* 100* 95 104* 98   Lipid Profile: No results for input(s): "CHOL", "HDL", "LDLCALC", "TRIG", "CHOLHDL", "LDLDIRECT" in the last 72 hours. Thyroid Function Tests: No results for input(s): "TSH", "T4TOTAL", "FREET4", "T3FREE", "THYROIDAB" in the last 72 hours. Anemia Panel: No results for input(s): "VITAMINB12", "FOLATE", "FERRITIN", "TIBC", "IRON", "RETICCTPCT" in the last 72 hours. Sepsis Labs: Recent Labs  Lab 05/01/23 1020 05/02/23 1041 05/02/23 1614 05/05/23 0623  PROCALCITON 32.25  --   --   --  LATICACIDVEN 2.3* 2.2* 1.9 1.4    Recent Results (from the past 240 hour(s))  Blood Culture (routine x 2)     Status: None   Collection Time: 04/30/23  3:56 AM   Specimen: BLOOD LEFT ARM  Result Value Ref Range Status   Specimen Description BLOOD LEFT ARM  Final   Special Requests   Final    BOTTLES DRAWN AEROBIC AND ANAEROBIC Blood Culture results may not be optimal due to an excessive volume of blood received in culture bottles   Culture   Final    NO GROWTH 5 DAYS Performed at [facility], [address], [place], [place] [address]    Report Status 05/05/2023 FINAL  Final  Blood Culture (routine x 2)     Status: None   Collection Time: 04/30/23  3:56 AM   Specimen: BLOOD RIGHT ARM  Result Value Ref Range Status   Specimen Description BLOOD RIGHT ARM  Final   Special  Requests   Final    BOTTLES DRAWN AEROBIC AND ANAEROBIC Blood Culture adequate volume   Culture   Final    NO GROWTH 5 DAYS Performed at [facility], [address], [place], [place] [address]    Report Status 05/05/2023 FINAL  Final  SARS Coronavirus 2 by RT PCR (hospital order, performed in [facility] hospital lab) *cepheid single result test* Anterior Nasal Swab     Status: None   Collection Time: 04/30/23  6:50 AM   Specimen: Anterior Nasal Swab  Result Value Ref Range Status   SARS Coronavirus 2 by RT PCR NEGATIVE NEGATIVE Final    Comment: Performed at [facility], [address] [place], [place] [address]  Gastrointestinal Panel by PCR , Stool     Status: None   Collection Time: 05/02/23 12:06 AM   Specimen: Stool  Result Value Ref Range Status   Campylobacter species NOT DETECTED NOT DETECTED Final   Plesimonas shigelloides NOT DETECTED NOT DETECTED Final   Salmonella species NOT DETECTED NOT DETECTED Final   Yersinia enterocolitica NOT DETECTED NOT DETECTED Final   Vibrio species NOT DETECTED NOT DETECTED Final   Vibrio cholerae NOT DETECTED NOT DETECTED Final   Enteroaggregative E coli (EAEC) NOT DETECTED NOT DETECTED Final   Enteropathogenic E coli (EPEC) NOT DETECTED NOT DETECTED Final   Enterotoxigenic E coli (ETEC) NOT DETECTED NOT DETECTED Final   Shiga like toxin producing E coli (STEC) NOT DETECTED NOT DETECTED Final   Shigella/Enteroinvasive E coli (EIEC) NOT DETECTED NOT DETECTED Final   Cryptosporidium NOT DETECTED NOT DETECTED Final   Cyclospora cayetanensis NOT DETECTED NOT DETECTED Final   Entamoeba histolytica NOT DETECTED NOT DETECTED Final   Giardia lamblia NOT DETECTED NOT DETECTED Final   Adenovirus F40/41 NOT DETECTED NOT DETECTED Final   Astrovirus NOT DETECTED NOT DETECTED Final   Norovirus GI/GII NOT DETECTED NOT DETECTED Final   Rotavirus A NOT DETECTED NOT DETECTED Final   Sapovirus (I, II, IV, and V) NOT DETECTED NOT DETECTED  Final    Comment: Performed at [facility], [address] [place], [place] [address]  C Difficile Quick Screen w PCR reflex     Status: Abnormal   Collection Time: 05/02/23 12:06 AM   Specimen: STOOL  Result Value Ref Range Status   C Diff antigen POSITIVE (A) NEGATIVE Final   C Diff toxin NEGATIVE NEGATIVE Final   C Diff interpretation Results are indeterminate. See PCR results.  Final  Comment: Performed at [facility], [address] [place], [place] [address]  C. Diff by PCR, Reflexed     Status: None   Collection Time: 05/02/23 12:06 AM  Result Value Ref Range Status   Toxigenic C. Difficile by PCR NEGATIVE NEGATIVE Final    Comment: Patient is colonized with non toxigenic C. difficile. May not need treatment unless significant symptoms are present. Performed at [facility], [address] [place], [place] [address]   Culture, blood (Routine X 2) w Reflex to ID Panel     Status: Abnormal (Preliminary result)   Collection Time: 05/03/23  1:02 PM   Specimen: BLOOD LEFT HAND  Result Value Ref Range Status   Specimen Description BLOOD LEFT HAND  Final   Special Requests AEROCOCCUS SPECIES Blood Culture adequate volume (A)  Final   Culture   Final    NO GROWTH 3 DAYS Performed at [facility], [address], [place], [place] [address]    Report Status PENDING  Incomplete  Culture, blood (Routine X 2) w Reflex to ID Panel     Status: None (Preliminary result)   Collection Time: 05/03/23  1:05 PM   Specimen: BLOOD LEFT HAND  Result Value Ref Range Status   Specimen Description BLOOD LEFT HAND  Final   Special Requests   Final    AEROBIC BOTTLE ONLY Blood Culture results may not be optimal due to an inadequate volume of blood received in culture bottles   Culture   Final    NO GROWTH 3 DAYS Performed at [facility], [address] [place], [place] [address]    Report Status PENDING  Incomplete         Radiology Studies: No  results found.      Scheduled Meds:  Chlorhexidine Gluconate Cloth  6 each Topical Q0600   heparin  5,000 Units Subcutaneous Q8H   insulin aspart  0-9 Units Subcutaneous Q4H   insulin glargine-yfgn  5 Units Subcutaneous Daily   pantoprazole (PROTONIX) IV  40 mg Intravenous QHS   Continuous Infusions:  anticoagulant sodium citrate     cefTRIAXone (ROCEPHIN)  IV Stopped (05/06/23 2952)   dextrose 45 mL/hr at 05/06/23 1125   metronidazole Stopped (05/06/23 0306)     LOS: 6 days    Time spent: 35 minutes    Gomer France A Daemyn Gariepy, MD [facility]   If 7PM-7AM, please contact night-coverage [email or web address]  05/06/2023, 1:36 PM

## 2023-05-06 NOTE — Progress Notes (Signed)
    [facility]    Date of Admission:  04/30/2023   Total days of antibiotics 6   ID: [name] is a 42 y.o. female with DM, ESRD, PAD s/p bilateral amputations,  sbo , small bowel ischemia s/p ex lap on 9/21 with ileocecectomy and end ileostomy Principal Problem:   SBO (small bowel obstruction) (HCC) Active Problems:   Ischemic disease of gut (HCC)    Subjective: Afebrile, tolerating HD, still some abdominal discomfort from recent surgery  Medications:   Chlorhexidine Gluconate Cloth  6 each Topical Q0600   heparin  5,000 Units Subcutaneous Q8H   insulin aspart  0-9 Units Subcutaneous Q4H   insulin glargine-yfgn  5 Units Subcutaneous Daily   pantoprazole (PROTONIX) IV  40 mg Intravenous QHS    Objective: Vital signs in last 24 hours: Temp:  [97.6 F (36.4 C)-98.4 F (36.9 C)] 97.9 F (36.6 C) (09/23 1614) Pulse Rate:  [73-96] 96 (09/23 1335) Resp:  [0-16] 12 (09/23 1614) BP: (90-139)/(56-75) 125/72 (09/23 1614) SpO2:  [90 %-100 %] 95 % (09/23 1614) Weight:  [53 kg-54.1 kg] 53.5 kg (09/23 1646)  Physical Exam  Constitutional:  oriented to person, place, and time. appears well-developed and well-nourished. No distress.  HENT: [place]/AT, PERRLA, no scleral icterus Mouth/Throat: Oropharynx is clear and moist. No oropharyngeal exudate.  Cardiovascular: Normal rate, regular rhythm and normal heart sounds. Exam reveals no gallop and no friction rub.  No murmur heard.  Pulmonary/Chest: Effort normal and breath sounds normal. No respiratory distress.  has no wheezes.  Neck = supple, no nuchal rigidity Abdominal: Soft. Bowel sounds are normal.  exhibits no distension. There is no tenderness.  Lymphadenopathy: no cervical adenopathy. No axillary adenopathy Neurological: alert and oriented to person, place, and time.  Skin: Skin is warm and dry. No rash noted. No erythema.  Psychiatric: a normal mood and affect.  behavior is normal.    Lab  Results Recent Labs    05/04/23 0229 05/05/23 0623 05/06/23 0239  WBC  --  15.5* 14.4*  HGB  --  8.4* 7.4*  HCT  --  26.5* 23.1*  NA 133*  --  129*  K 3.7  --  3.7  CL 93*  --  94*  CO2 24  --  22  BUN 20  --  51*  CREATININE 2.90*  --  4.78*   Liver Panel Recent Labs    05/06/23 0239  ALBUMIN 1.8*   Sedimentation Rate No results for input(s): "ESRSEDRATE" in the last 72 hours. C-Reactive Protein No results for input(s): "CRP" in the last 72 hours.  Microbiology: 9/20 blood cx aerococcus Studies/Results: No results found.   Assessment/Plan: Sbo with SB ischemia s/p ileo-cecectomy with end ileostomy = continue on ceftriaxone plus metronidazole through 9/25.   Aerococcus bacteremia = in the setting of bowel ischemia, fever and leukocytosis, would recommend to treat as true pathogen rather than contaminant. Likely transient, now has source control. Will transition to ampicillin after 9/25.await sensitivities  ESRD on HD = tolerating abtx, no dose adjustment needed  Leukocytosis = anticipate to trend downward as she continues to be treated for infection/bacteremia    [facility] Pager: [phone]  05/06/2023, 5:45 PM

## 2023-05-06 NOTE — Plan of Care (Signed)
Problem: Coping: Goal: Ability to adjust to condition or change in health will improve Outcome: Progressing   Problem: Fluid Volume: Goal: Ability to maintain a balanced intake and output will improve Outcome: Progressing   Problem: Health Behavior/Discharge Planning: Goal: Ability to identify and utilize available resources and services will improve Outcome: Progressing Goal: Ability to manage health-related needs will improve Outcome: Progressing   Problem: Metabolic: Goal: Ability to maintain appropriate glucose levels will improve Outcome: Progressing   Problem: Nutritional: Goal: Maintenance of adequate nutrition will improve Outcome: Progressing Goal: Progress toward achieving an optimal weight will improve Outcome: Progressing   Problem: Skin Integrity: Goal: Risk for impaired skin integrity will decrease Outcome: Progressing   Problem: Tissue Perfusion: Goal: Adequacy of tissue perfusion will improve Outcome: Progressing   Problem: Education: Goal: Knowledge of General Education information will improve Description: Including pain rating scale, medication(s)/side effects and non-pharmacologic comfort measures Outcome: Progressing   Problem: Health Behavior/Discharge Planning: Goal: Ability to manage health-related needs will improve Outcome: Progressing   Problem: Clinical Measurements: Goal: Ability to maintain clinical measurements within normal limits will improve Outcome: Progressing Goal: Will remain free from infection Outcome: Progressing Goal: Diagnostic test results will improve Outcome: Progressing Goal: Respiratory complications will improve Outcome: Progressing Goal: Cardiovascular complication will be avoided Outcome: Progressing   Problem: Activity: Goal: Risk for activity intolerance will decrease Outcome: Progressing   Problem: Nutrition: Goal: Adequate nutrition will be maintained Outcome: Progressing   Problem: Coping: Goal:  Level of anxiety will decrease Outcome: Progressing   Problem: Elimination: Goal: Will not experience complications related to bowel motility Outcome: Progressing Goal: Will not experience complications related to urinary retention Outcome: Progressing   Problem: Pain Managment: Goal: General experience of comfort will improve Outcome: Progressing   Problem: Safety: Goal: Ability to remain free from injury will improve Outcome: Progressing   Problem: Skin Integrity: Goal: Risk for impaired skin integrity will decrease Outcome: Progressing

## 2023-05-07 DIAGNOSIS — K56609 Unspecified intestinal obstruction, unspecified as to partial versus complete obstruction: Secondary | ICD-10-CM | POA: Diagnosis not present

## 2023-05-07 DIAGNOSIS — R198 Other specified symptoms and signs involving the digestive system and abdomen: Secondary | ICD-10-CM

## 2023-05-07 LAB — CBC
HCT: 28.4 % — ABNORMAL LOW (ref 36.0–46.0)
Hemoglobin: 9 g/dL — ABNORMAL LOW (ref 12.0–15.0)
MCH: 25.5 pg — ABNORMAL LOW (ref 26.0–34.0)
MCHC: 31.7 g/dL (ref 30.0–36.0)
MCV: 80.5 fL (ref 80.0–100.0)
Platelets: 267 10*3/uL (ref 150–400)
RBC: 3.53 MIL/uL — ABNORMAL LOW (ref 3.87–5.11)
RDW: 19.4 % — ABNORMAL HIGH (ref 11.5–15.5)
WBC: 10.5 10*3/uL (ref 4.0–10.5)
nRBC: 0.6 % — ABNORMAL HIGH (ref 0.0–0.2)

## 2023-05-07 LAB — GLUCOSE, CAPILLARY
Glucose-Capillary: 101 mg/dL — ABNORMAL HIGH (ref 70–99)
Glucose-Capillary: 102 mg/dL — ABNORMAL HIGH (ref 70–99)
Glucose-Capillary: 118 mg/dL — ABNORMAL HIGH (ref 70–99)
Glucose-Capillary: 125 mg/dL — ABNORMAL HIGH (ref 70–99)
Glucose-Capillary: 80 mg/dL (ref 70–99)
Glucose-Capillary: 97 mg/dL (ref 70–99)

## 2023-05-07 LAB — SURGICAL PATHOLOGY

## 2023-05-07 MED ORDER — DOXERCALCIFEROL 4 MCG/2ML IV SOLN
4.0000 ug | INTRAVENOUS | Status: DC
  Administered 2023-05-15 – 2023-05-24 (×5): 4 ug via INTRAVENOUS
  Filled 2023-05-07 (×13): qty 2

## 2023-05-07 MED ORDER — CHLORHEXIDINE GLUCONATE CLOTH 2 % EX PADS
6.0000 | MEDICATED_PAD | Freq: Every day | CUTANEOUS | Status: DC
  Administered 2023-05-07 – 2023-05-11 (×4): 6 via TOPICAL

## 2023-05-07 MED ORDER — DARBEPOETIN ALFA 60 MCG/0.3ML IJ SOSY
60.0000 ug | PREFILLED_SYRINGE | INTRAMUSCULAR | Status: DC
  Filled 2023-05-07: qty 0.3

## 2023-05-07 NOTE — Progress Notes (Addendum)
    3 Days Post-Op  Subjective: Having bowel movements. Tolerating liquids   Objective: Vital signs in last 24 hours: Temp:  [97.9 F (36.6 C)-99.6 F (37.6 C)] 99 F (37.2 C) (09/24 0400) Pulse Rate:  [82-96] 86 (09/24 0820) Resp:  [0-18] 18 (09/24 0820) BP: (93-139)/(61-73) 112/72 (09/24 0820) SpO2:  [90 %-100 %] 100 % (09/24 0820) Weight:  [51.5 kg-53.5 kg] 51.5 kg (09/24 0609) Last BM Date : 05/07/23  Intake/Output from previous day: 09/23 0701 - 09/24 0700 In: 5078.1 [P.O.:360; I.V.:3918.1; IV Piggyback:800] Out: 2050.8 [Stool:2050] Intake/Output this shift: No intake/output data recorded.  PE: General: alert Neuro: alert and oriented, no focal deficits Resp: normal work of breathing on room air Abdomen: soft, midline dressing c/d/I  RLQ ileostomy w/ pink viable stoma, some liquid stool and air in pouch.   Lab Results:  Recent Labs    05/06/23 0239 05/07/23 0758  WBC 14.4* 10.5  HGB 7.4* 9.0*  HCT 23.1* 28.4*  PLT 230 267   BMET Recent Labs    05/06/23 0239  NA 129*  K 3.7  CL 94*  CO2 22  GLUCOSE 132*  BUN 51*  CREATININE 4.78*  CALCIUM 7.7*   PT/INR No results for input(s): "LABPROT", "INR" in the last 72 hours.  CMP     Component Value Date/Time   NA 129 (L) 05/06/2023 0239   NA 133 (L) 05/18/2020 0927   K 3.7 05/06/2023 0239   CL 94 (L) 05/06/2023 0239   CO2 22 05/06/2023 0239   GLUCOSE 132 (H) 05/06/2023 0239   BUN 51 (H) 05/06/2023 0239   BUN 45 (H) 05/18/2020 0927   CREATININE 4.78 (H) 05/06/2023 0239   CALCIUM 7.7 (L) 05/06/2023 0239   CALCIUM 12.1 (H) 11/13/2021 2026   PROT 6.2 (L) 05/01/2023 1020   PROT 7.6 05/18/2020 0927   ALBUMIN 1.8 (L) 05/06/2023 0239   ALBUMIN 3.9 05/18/2020 0927   AST 26 05/01/2023 1020   ALT 18 05/01/2023 1020   ALKPHOS 72 05/01/2023 1020   BILITOT 0.9 05/01/2023 1020   BILITOT <0.2 05/18/2020 0927   GFRNONAA 11 (L) 05/06/2023 0239   GFRAA 6 (L) 05/18/2020 0927   Lipase     Component Value  Date/Time   LIPASE 40 12/20/2022 0918     Assessment/Plan 42 yo female presenting with abdominal pain, diarrhea, low grade fevers, dilated small bowel and colonic wall thickening on CT  s/p diagnostic lap converted to ex-lap with extended ileo-cecectomy (mid-ileum to just past cecum), 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 Hillery Hunter Intra-op findings were notable for adhesions in the RLQ w/ associated twisting of the mesentery resulting in SB ischemia.  - POD#3 - consult WOC RN for new ileostomy - BID moist-to-dry dressing  - Advance diet as tolerated  FEN: ADAT ID: Rocephin/Flagyl 9/21 >> plan to stop abx 9/25 @ 2359 VTE: SCDs, SQH q 8h Foley: out Dispo: progressive care per primary   LOS: 7 days   Quentin Ore, MD General, Bariatric and Minimally Invasive Surgery [facility]        05/07/23 9:06 AM

## 2023-05-07 NOTE — Progress Notes (Signed)
    [facility]    Date of Admission:  04/30/2023      ID: [name] is a 42 y.o. 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 Afebrile overnight   Micro lab reporting -- corrected this morning, no bacteremia  Medications:   Chlorhexidine Gluconate Cloth  6 each Topical Q0600   [START ON 05/10/2023] darbepoetin (ARANESP) injection - DIALYSIS  60 mcg Subcutaneous Q Fri-1800   [START ON 05/08/2023] doxercalciferol  4 mcg Intravenous Q M,W,F-HD   heparin  5,000 Units Subcutaneous Q8H   insulin aspart  0-9 Units Subcutaneous Q4H   insulin glargine-yfgn  5 Units Subcutaneous Daily   pantoprazole (PROTONIX) IV  40 mg Intravenous QHS    Objective: Vital signs in last 24 hours: Temp:  [97.9 F (36.6 C)-99.6 F (37.6 C)] 98.4 F (36.9 C) (09/24 1139) Pulse Rate:  [82-95] 85 (09/24 1139) Resp:  [0-18] 18 (09/24 1139) BP: (93-125)/(61-72) 105/64 (09/24 1139) SpO2:  [94 %-100 %] 100 % (09/24 1139) Weight:  [51.5 kg-53.5 kg] 51.5 kg (09/24 0609)  Physical Exam  Constitutional:  oriented to person, place, and time. appears well-developed and well-nourished. No distress.  HENT: [place]/AT, PERRLA, no scleral icterus Mouth/Throat: Oropharynx is clear and moist. No oropharyngeal exudate.  Cardiovascular: Normal rate, regular rhythm and normal heart sounds. Exam reveals no gallop and no friction rub.  No murmur heard.  Pulmonary/Chest: Effort normal and breath sounds normal. No respiratory distress.  has no wheezes.  Neck = supple, no nuchal rigidity Abdominal: Soft. Bowel sounds are normal.  exhibits no distension. There is  no tenderness. Endileostomy RLQ and midline dressing Lymphadenopathy: no cervical adenopathy. No axillary adenopathy Neurological: alert and oriented to person, place, and time.  Skin: Skin is warm and dry. No rash noted. No erythema.  Psychiatric: a normal mood and affect.  behavior is normal.    Lab Results Recent Labs    05/06/23 0239 05/07/23 0758  WBC 14.4* 10.5  HGB 7.4* 9.0*  HCT 23.1* 28.4*  NA 129*  --   K 3.7  --   CL 94*  --   CO2 22  --   BUN 51*  --   CREATININE 4.78*  --    Liver Panel Recent Labs    05/06/23 0239  ALBUMIN 1.8*    Microbiology: Blood cx NGTD Studies/Results: No results found.   Assessment/Plan: Small bowel obstruction/ischemia s/p ex-lap with ileo-cecectomy and end ileostomy = continue on ceftriaxone plus metronidazole through the end of 9/25 as part of stop it trial. At this time, no evidence of infection to continue longer  Continue with bid moist to dry dressing changes  Aerococcus bacteremia = corrected in micro reports, reporting error. Patient did not have concurrent bacteremia. Thus can stop abtx on 9/26   Leukocytosis = improved, early at baseline. Continue to monitor  Esrd on hd = continue with home regimen m-w-f  Will sign off  [facility] Pager: [phone]  05/07/2023, 2:08 PM

## 2023-05-07 NOTE — Progress Notes (Signed)
[facility] NEPHROLOGY PROGRESS NOTE  OP HD orders. Dialysis Orders: [facility] MWF  3h  400/1.5  49kg   R AVF   -Heparin none - last OP HD 9/16, post wt 51kg, coming off 2- 4kg over the last 3 wks - mircera IV q 2 wks, last 9/13, due 9/27 - hectorol 4 mcg IV three times per week.   Assessment/ Plan: # Abdominal pain - partial SBO s/p ex-lap with extended ileo-cecectomy with end ileostomy. Per primary/Gen surgery.   #Sepsis 2/2 to ischemic Colitis - Management per primary.   # ESRD - on HD MWF.  Status post dialysis yesterday with 2 L UF, tolerated well.  Next HD 05/08/2023.  #Hyperkalemia. Resolved.   # HTN/Volume - no vol excess on exam. UF wth HD.   # Anemia due to ESRD - Hgb 9, continue ESA.  Monitor hemoglobin.  # CKD-MBD - CCa and phosphorus level in range. Cont IV vdra. Resume sensipar when eating.   #PAD - bilat AKA.   Subjective: Seen and examined at the bedside.  No new event.  Tolerated dialysis well.  Denies nausea, vomiting.  No chest pain or shortness of breath.  Next HD tomorrow.  Objective Vital signs in last 24 hours: Vitals:   05/07/23 0400 05/07/23 0600 05/07/23 0609 05/07/23 0820  BP: 110/67   112/72  Pulse: 86 88  86  Resp:  15  18  Temp: 99 F (37.2 C)     TempSrc: Oral   Oral  SpO2: 96% 95%  100%  Weight:   51.5 kg    Weight change: -1.1 kg  Intake/Output Summary (Last 24 hours) at 05/07/2023 1007 Last data filed at 05/07/2023 0700 Gross per 24 hour  Intake 5078.13 ml  Output 2050.8 ml  Net 3027.33 ml       Labs: RENAL PANEL Recent Labs  Lab 05/01/23 1020 05/02/23 1041 05/03/23 1412 05/04/23 0229 05/06/23 0239  NA 141 134* 130* 133* 129*  K 6.2* 4.2 4.6 3.7 3.7  CL 98 92* 89* 93* 94*  CO2 25 27 23 24 22   GLUCOSE 83 149* 104* 130* 132*  BUN 72* 32* 45* 20 51*  CREATININE 7.57* 4.13* 5.49* 2.90* 4.78*  CALCIUM 8.3* 8.1* 8.9 8.7* 7.7*  PHOS  --   --   --   --  4.3  ALBUMIN 2.1*  --   --   --  1.8*     Liver Function Tests: Recent Labs  Lab 05/01/23 1020 05/06/23 0239  AST 26  --   ALT 18  --   ALKPHOS 72  --   BILITOT 0.9  --   PROT 6.2*  --   ALBUMIN 2.1* 1.8*   No results for input(s): "LIPASE", "AMYLASE" in the last 168 hours. No results for input(s): "AMMONIA" in the last 168 hours. CBC: Recent Labs    07/25/22 0532 07/26/22 0654 05/03/23 1302 05/03/23 1412 05/05/23 0623 05/06/23 0239 05/07/23 0758  HGB 9.8*   < > 11.4* 9.5* 8.4* 7.4* 9.0*  MCV 85.3   < > 80.1 82.1 79.6* 81.9 80.5  FERRITIN 562*  --   --   --   --   --   --   TIBC 179*  --   --   --   --   --   --   IRON 56  --   --   --   --   --   --    < > =  values in this interval not displayed.    Cardiac Enzymes: No results for input(s): "CKTOTAL", "CKMB", "CKMBINDEX", "TROPONINI" in the last 168 hours. CBG: Recent Labs  Lab 05/06/23 1709 05/06/23 1938 05/06/23 2352 05/07/23 0435 05/07/23 0801  GLUCAP 111* 111* 103* 101* 97    Iron Studies: No results for input(s): "IRON", "TIBC", "TRANSFERRIN", "FERRITIN" in the last 72 hours. Studies/Results: No results found.  Medications: Infusions:  anticoagulant sodium citrate     cefTRIAXone (ROCEPHIN)  IV Stopped (05/07/23 0230)   dextrose 45 mL/hr at 05/07/23 0700   metronidazole Stopped (05/07/23 0300)    Scheduled Medications:  heparin  5,000 Units Subcutaneous Q8H   insulin aspart  0-9 Units Subcutaneous Q4H   insulin glargine-yfgn  5 Units Subcutaneous Daily   pantoprazole (PROTONIX) IV  40 mg Intravenous QHS    have reviewed scheduled and prn medications.  Physical Exam: General: Confused female, lying on bed. Heart:RRR, s1s2 nl Lungs:clear b/l, no crackle Abdomen: NG tube present, right lower quadrant ileostomy. Extremities: Bilateral BKA Dialysis Access: Right AV fistula has thrill and bruit.  [name] [name] 05/07/2023,10:07 AM  LOS: 7 days

## 2023-05-07 NOTE — Care Management Important Message (Signed)
Important Message  Patient Details  Name: [name] MRN: [id number] Date of Birth: [birth year]   Important Message Given:  Yes - Medicare IM     Dorena Bodo 05/07/2023, 3:16 PM

## 2023-05-07 NOTE — Evaluation (Signed)
Occupational Therapy Evaluation Patient Details Name: [name] MRN: [id number] DOB: 05-Feb-1981 Today's Date: 05/07/2023   History of Present Illness Pt is a 42 y/o F presenting to ED on 9/17 from [facility] with abdominal pain, found to have SBO. S/p laparoscopy, extended ileocecectomy on 9/21. PMH includes bilat AKA, ESRD on HD MWF, HTN, HLD, anemia, CVA, GBS, mesenteric ischemia wiht colonic pneumatosis   Clinical Impression   Pt from SNF, reports staff uses lift to transfer her from bed <> chair. Pt has assist from staff at [facility] for ADLs. Pt currently needing min-max A for ADLs, and max A +2 for bed mobility, has posterior lean when sitting EOB and only able to sit up for a few mins for grooming task before returning to supine due to back pain. Pt presenting with impairments listed below, will follow acutely. Patient will benefit from continued inpatient follow up therapy, <3 hours/day to maximize safety/ind with ADLs/functional mobility.       If plan is discharge home, recommend the following: Two people to help with walking and/or transfers;A lot of help with bathing/dressing/bathroom;Assistance with feeding;Assistance with cooking/housework;Direct supervision/assist for medications management;Direct supervision/assist for financial management;Assist for transportation;Help with stairs or ramp for entrance    Functional Status Assessment  Patient has had a recent decline in their functional status and demonstrates the ability to make significant improvements in function in a reasonable and predictable amount of time.  Equipment Recommendations  Other (comment) (defer)    Recommendations for Other Services       Precautions / Restrictions Precautions Precautions: Fall Precaution Comments: low vision, bilat AKA Restrictions Weight Bearing Restrictions: No      Mobility Bed Mobility Overal bed mobility: Needs Assistance Bed Mobility: Sit to Supine, Supine to Sit      Supine to sit: Max assist, +2 for physical assistance Sit to supine: Max assist, +2 for physical assistance        Transfers                   General transfer comment: deferred      Balance Overall balance assessment: Needs assistance Sitting-balance support: Bilateral upper extremity supported Sitting balance-Leahy Scale: Poor Sitting balance - Comments: post lean sitting EOB                                   ADL either performed or assessed with clinical judgement   ADL Overall ADL's : Needs assistance/impaired Eating/Feeding: Minimal assistance   Grooming: Minimal assistance   Upper Body Bathing: Moderate assistance   Lower Body Bathing: Maximal assistance   Upper Body Dressing : Moderate assistance   Lower Body Dressing: Maximal assistance   Toilet Transfer: Total assistance   Toileting- Clothing Manipulation and Hygiene: Maximal assistance       Functional mobility during ADLs: Total assistance       Vision   Vision Assessment?: Vision impaired- to be further tested in functional context Additional Comments: reports only being able to see shadows     Perception Perception: Not tested       Praxis Praxis: Not tested       Pertinent Vitals/Pain Pain Assessment Pain Assessment: Faces Pain Score: 5  Faces Pain Scale: Hurts even more Pain Location: back Pain Descriptors / Indicators: Discomfort Pain Intervention(s): Limited activity within patient's tolerance, Monitored during session, Repositioned     Extremity/Trunk Assessment Upper Extremity Assessment Upper Extremity Assessment:  Generalized weakness   Lower Extremity Assessment Lower Extremity Assessment: Defer to PT evaluation       Communication     Cognition Arousal: Alert Behavior During Therapy: Flat affect Overall Cognitive Status: No family/caregiver present to determine baseline cognitive functioning                                  General Comments: disoriented to time, states it is 2028     General Comments  VSS    Exercises     Shoulder Instructions      Home Living Family/patient expects to be discharged to:: Skilled nursing facility                                        Prior Functioning/Environment Prior Level of Function : Needs assist             Mobility Comments: Per pt, hoyer lift transfers to/from wheelchair. Able to self propel. ADLs Comments: staff assists with all ADLs, pt reports able to self feed and perform grooming tasks        OT Problem List: Decreased strength;Decreased range of motion;Decreased activity tolerance;Impaired balance (sitting and/or standing);Impaired vision/perception;Decreased cognition;Decreased safety awareness      OT Treatment/Interventions: Self-care/ADL training;Therapeutic exercise;Energy conservation;DME and/or AE instruction;Therapeutic activities;Patient/family education;Balance training    OT Goals(Current goals can be found in the care plan section) Acute Rehab OT Goals Patient Stated Goal: none stated OT Goal Formulation: With patient Time For Goal Achievement: 05/21/23 Potential to Achieve Goals: Good ADL Goals Pt Will Perform Eating: with supervision;sitting;bed level Pt Will Perform Grooming: with supervision;sitting;bed level Pt/caregiver will Perform Home Exercise Program: Both right and left upper extremity;With written HEP provided;With Supervision Additional ADL Goal #1: pt will perform bed mobility min A in prep for bed level/seated ADLs  OT Frequency: Min 1X/week    Co-evaluation PT/OT/SLP Co-Evaluation/Treatment: Yes Reason for Co-Treatment: Complexity of the patient's impairments (multi-system involvement);Necessary to address cognition/behavior during functional activity;To address functional/ADL transfers   OT goals addressed during session: ADL's and self-care      AM-PAC OT "6 Clicks" Daily Activity      Outcome Measure Help from another person eating meals?: A Little Help from another person taking care of personal grooming?: A Little Help from another person toileting, which includes using toliet, bedpan, or urinal?: A Lot Help from another person bathing (including washing, rinsing, drying)?: A Lot Help from another person to put on and taking off regular upper body clothing?: A Lot Help from another person to put on and taking off regular lower body clothing?: A Lot 6 Click Score: 14   End of Session Nurse Communication: Mobility status  Activity Tolerance: Patient tolerated treatment well Patient left: in bed;with call bell/phone within reach;with bed alarm set  OT Visit Diagnosis: Muscle weakness (generalized) (M62.81);Low vision, both eyes (H54.2)                Time: 6295-2841 OT Time Calculation (min): 14 min Charges:  OT General Charges $OT Visit: 1 Visit OT Evaluation $OT Eval Moderate Complexity: 1 Mod  Virat Prather K, OTD, OTR/L SecureChat Preferred Acute Rehab [phone]   Dalphine Handing 05/07/2023, 12:33 PM

## 2023-05-07 NOTE — Evaluation (Signed)
Physical Therapy Evaluation Patient Details Name: [name] MRN: [id number] DOB: [birth year] Today's Date: 05/07/2023  History of Present Illness  Pt is a 42 y/o F presenting to ED on 9/17 from [facility] with abdominal pain, found to have SBO. S/p laparoscopy, extended ileocecectomy on 9/21. PMH includes bilat AKA, ESRD on HD MWF, HTN, HLD, anemia, CVA, GBS, mesenteric ischemia wiht colonic pneumatosis   Clinical Impression  Pt has resided at [facility] for over a year and reports staff using hoyer lift to transfer to/from w/c and that they push her. Pt with noted impaired vision and decreased strength and pain in bilat UEs to assist with functional mobility. Pt maxAX2 to transfer to EOB and mod/maxA to maintain EOB balance. Pt near baseline. Acute PT to keep on trial x1 week in effort to prevent further deconditioning while in hospital.      If plan is discharge home, recommend the following:     Can travel by private vehicle   No    Equipment Recommendations None recommended by PT  Recommendations for Other Services       Functional Status Assessment Patient has had a recent decline in their functional status and demonstrates the ability to make significant improvements in function in a reasonable and predictable amount of time.     Precautions / Restrictions Precautions Precautions: Fall Precaution Comments: low vision, bilat AKA Restrictions Weight Bearing Restrictions: No      Mobility  Bed Mobility Overal bed mobility: Needs Assistance Bed Mobility: Sit to Supine, Supine to Sit     Supine to sit: Max assist, +2 for physical assistance Sit to supine: Max assist, +2 for physical assistance   General bed mobility comments: attempted to have pt assist via tactile cues to R UE however pt c/o pain    Transfers                   General transfer comment: deferred    Ambulation/Gait                  Stairs            Wheelchair  Mobility     Tilt Bed    Modified Rankin (Stroke Patients Only)       Balance Overall balance assessment: Needs assistance Sitting-balance support: Bilateral upper extremity supported Sitting balance-Leahy Scale: Poor Sitting balance - Comments: post lean sitting EOB, requires mod/maxA to maintain EOB balance                                     Pertinent Vitals/Pain Pain Assessment Pain Assessment: Faces Faces Pain Scale: Hurts even more Pain Location: back Pain Descriptors / Indicators: Discomfort Pain Intervention(s): Limited activity within patient's tolerance    Home Living Family/patient expects to be discharged to:: Skilled nursing facility                   Additional Comments: Pt has resided at [facility] since R AKA in Feb 2023.    Prior Function Prior Level of Function : Needs assist             Mobility Comments: Per pt, hoyer lift transfers to/from wheelchair. reports she doesn't propel herself ADLs Comments: staff assists with all ADLs, pt reports able to self feed but SNF staff assist with bathing/dressing     Extremity/Trunk Assessment   Upper Extremity Assessment Upper  Extremity Assessment: Defer to OT evaluation    Lower Extremity Assessment Lower Extremity Assessment:  (bilat AKA, able to hip flex)    Cervical / Trunk Assessment Cervical / Trunk Assessment:  (ileostomy)  Communication   Communication Communication: No apparent difficulties  Cognition Arousal: Alert Behavior During Therapy: Flat affect Overall Cognitive Status: No family/caregiver present to determine baseline cognitive functioning                                 General Comments: disoriented to time, states it is 2028, is aware she was in the hospital for abdominal problems        General Comments General comments (skin integrity, edema, etc.): VSS    Exercises     Assessment/Plan    PT Assessment Patient needs continued PT  services  PT Problem List Decreased strength;Decreased activity tolerance;Decreased balance;Decreased mobility       PT Treatment Interventions Functional mobility training;Therapeutic activities;Therapeutic exercise;Patient/family education    PT Goals (Current goals can be found in the Care Plan section)  Acute Rehab PT Goals Patient Stated Goal: didn't state PT Goal Formulation: Patient unable to participate in goal setting Time For Goal Achievement: 05/21/23 Potential to Achieve Goals: Fair    Frequency Min 1X/week     Co-evaluation PT/OT/SLP Co-Evaluation/Treatment: Yes Reason for Co-Treatment: Complexity of the patient's impairments (multi-system involvement);Necessary to address cognition/behavior during functional activity;To address functional/ADL transfers PT goals addressed during session: Mobility/safety with mobility OT goals addressed during session: ADL's and self-care       AM-PAC PT "6 Clicks" Mobility  Outcome Measure Help needed turning from your back to your side while in a flat bed without using bedrails?: Total Help needed moving from lying on your back to sitting on the side of a flat bed without using bedrails?: Total Help needed moving to and from a bed to a chair (including a wheelchair)?: Total Help needed standing up from a chair using your arms (e.g., wheelchair or bedside chair)?: Total Help needed to walk in hospital room?: Total Help needed climbing 3-5 steps with a railing? : Total 6 Click Score: 6    End of Session   Activity Tolerance: Patient limited by pain Patient left: in bed;with call bell/phone within reach;with bed alarm set Nurse Communication: Mobility status PT Visit Diagnosis: Pain;Difficulty in walking, not elsewhere classified (R26.2) Pain - part of body:  (abdomen)    Time: 7829-5621 PT Time Calculation (min) (ACUTE ONLY): 15 min   Charges:   PT Evaluation $PT Eval Moderate Complexity: 1 Mod   PT General Charges $$  ACUTE PT VISIT: 1 Visit         Lewis Shock, PT, DPT Acute Rehabilitation Services Secure chat preferred Office #: [phone]   Iona Hansen 05/07/2023, 1:05 PM

## 2023-05-07 NOTE — Progress Notes (Signed)
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  Patient was found to be febrile, tachycardic, lactic acid 1.9 followed by 2.5, chest x-ray atelectasis versus infiltrate.  CT abdomen and pelvis with contrast shows evidence of a small bowel obstruction with dilated ileal segment up to 4 cm, sigmoid segment consistent with colitis, diffuse gastric wall thickening, cholelithiasis with mild chronic prominence of the common bile duct.  General surgery consulted and following concern for bowel ischemia.  On 9/21st patient spiked high fever, became hypotensive.  She was taken to the OR for diagnostic laparoscopy found to have a small bowel obstruction, bowel ischemia underwent extended ileocecectomy and end ileostomy by Dr. Hillery Hunter.     Assessment & Plan:   Principal Problem:   SBO (small bowel obstruction) (HCC) Active Problems:   Ischemic disease of gut (HCC)   1-Partial small bowel obstruction , Bowel ischemia. SP extended Ileocecectomy and End Illeostomy 9/21. -Patient presented with abdominal pain, lactic acidosis, CT abdomen and pelvis consistent with SBO and colitis -Appreciate general surgery recommendation plan to continue with n.p.o. status -C diff PCR negative, c diff antigen positive. ID consulted.  -CT abdomen and pelvis angio negative for complete occlusion.  -9/21: Spike fever 103. Hypotensive.  IV antibiotics ceftriaxone, flagyl.  -Underwent Diagnostic laparoscopy, extended Ileocecectomy and End Illeostomy 9/21. -Management per sx. Started on clear, NG tube removed.  Diet per sx.  ID planing to stop antibiotics 9/26.  Sepsis; Secondary to Ischemic Colitis;  Patient spike fever 103, Hypotensive, Tachycardic.  Continue with IV ceftriaxone and Flagyl.  CCM consulted. Support care. Hypotension resolved  BP normalized.   Diabetes type 1: CBG 74.  She is currently NPO.  Stated low rate D5.  Semglee 5 mg   daily Monitor CBG B-met no acidosis gap normal.  Started on clear diet. Monitor CBG, might need to discontinue D 5  Peripheral vascular disease status post bilateral BKA -Continue to hold statins, resume when tolerates diet.   ESRD on hemodialysis Monday Wednesday and Friday, hyperkalemia Received HD 9/18. 9/20.   Essential Hypertension: Continue to hold blood pressure medications  Hyperlipidemia: Continue to hold statins Chronic anemia anemia of chronic disease: Continue to monitor hemoglobin   See wound care documentation below.   Pressure Injury 04/30/23 Sacrum Mid Stage 2 -  Partial thickness loss of dermis presenting as a shallow open injury with a red, pink wound bed without slough. 5 cm x 7 cm pink dry (Active)  04/30/23   Location: Sacrum  Location Orientation: Mid  Staging: Stage 2 -  Partial thickness loss of dermis presenting as a shallow open injury with a red, pink wound bed without slough.  Wound Description (Comments): 5 cm x 7 cm pink dry  Present on Admission: Yes  Dressing Type Foam - Lift dressing to assess site every shift 05/07/23 0600     Estimated body mass index is 21.45 kg/m as calculated from the following:   Height as of 03/17/23: 5\' 1"  (1.549 m).  Weight as of this encounter: 51.5 kg.   DVT prophylaxis: heparin  Code Status: full code Family Communication: SW to contact family.  Disposition Plan:  Status is: Inpatient Remains  inpatient appropriate because: management of abdominal pain     Consultants:  Surgery Nephrology   Procedures:  HD  Antimicrobials:    Subjective: Report abdominal pain is better. She understand she will need to go to rehab.   Objective: Vitals:   05/07/23 0200 05/07/23 0400 05/07/23 0600 05/07/23 0609  BP:  110/67    Pulse: 82 86 88   Resp: 16  15   Temp:  99 F (37.2 C)    TempSrc:  Oral    SpO2: 95% 96% 95%   Weight:    51.5 kg    Intake/Output Summary (Last 24 hours) at 05/07/2023 0816 Last data filed at 05/07/2023 0700 Gross per 24 hour  Intake 5078.13 ml  Output 2050.8 ml  Net 3027.33 ml   Filed Weights   05/06/23 1335 05/06/23 1646 05/07/23 0609  Weight: 53 kg 53.5 kg 51.5 kg    Examination:  General exam: NAD Respiratory system: CTA Cardiovascular system: S 1, S 2 RRR Gastrointestinal system: BS present, soft, nt ostomy in place, bag with stool Central nervous system: Alert   Data Reviewed: I have personally reviewed following labs and imaging studies  CBC: Recent Labs  Lab 05/02/23 1041 05/03/23 1302 05/03/23 1412 05/05/23 0623 05/06/23 0239  WBC 13.1* 9.3 14.3* 15.5* 14.4*  NEUTROABS  --  7.6 11.8*  --   --   HGB 8.4* 11.4* 9.5* 8.4* 7.4*  HCT 26.9* 36.2 29.4* 26.5* 23.1*  MCV 84.3 80.1 82.1 79.6* 81.9  PLT 240 161 232 190 230   Basic Metabolic Panel: Recent Labs  Lab 05/01/23 1020 05/02/23 1041 05/03/23 1412 05/04/23 0229 05/06/23 0239  NA 141 134* 130* 133* 129*  K 6.2* 4.2 4.6 3.7 3.7  CL 98 92* 89* 93* 94*  CO2 25 27 23 24 22   GLUCOSE 83 149* 104* 130* 132*  BUN 72* 32* 45* 20 51*  CREATININE 7.57* 4.13* 5.49* 2.90* 4.78*  CALCIUM 8.3* 8.1* 8.9 8.7* 7.7*  PHOS  --   --   --   --  4.3   GFR: Estimated Creatinine Clearance: 11.7 mL/min (A) (by C-G formula based on SCr of 4.78 mg/dL (H)). Liver Function Tests: Recent Labs  Lab 05/01/23 1020 05/06/23 0239  AST 26  --   ALT 18  --   ALKPHOS 72  --   BILITOT 0.9  --    PROT 6.2*  --   ALBUMIN 2.1* 1.8*   No results for input(s): "LIPASE", "AMYLASE" in the last 168 hours. No results for input(s): "AMMONIA" in the last 168 hours. Coagulation Profile: No results for input(s): "INR", "PROTIME" in the last 168 hours.  Cardiac Enzymes: No results for input(s): "CKTOTAL", "CKMB", "CKMBINDEX", "TROPONINI" in the last 168 hours. BNP (last 3 results) No results for input(s): "PROBNP" in the last 8760 hours. HbA1C: No results for input(s): "HGBA1C" in the last 72 hours.  CBG: Recent Labs  Lab 05/06/23 1709 05/06/23 1938 05/06/23 2352 05/07/23 0435 05/07/23 0801  GLUCAP 111* 111* 103* 101* 97   Lipid Profile: No results for input(s): "CHOL", "HDL", "LDLCALC", "TRIG", "CHOLHDL", "LDLDIRECT" in the last 72 hours. Thyroid Function Tests: No results for input(s): "TSH", "T4TOTAL", "FREET4", "T3FREE", "THYROIDAB" in the last 72 hours. Anemia Panel: No results for input(s): "VITAMINB12", "FOLATE", "FERRITIN", "TIBC", "IRON", "RETICCTPCT" in  the last 72 hours. Sepsis Labs: Recent Labs  Lab 05/01/23 1020 05/02/23 1041 05/02/23 1614 05/05/23 0623  PROCALCITON 32.25  --   --   --   LATICACIDVEN 2.3* 2.2* 1.9 1.4    Recent Results (from the past 240 hour(s))  Blood Culture (routine x 2)     Status: None   Collection Time: 04/30/23  3:56 AM   Specimen: BLOOD LEFT ARM  Result Value Ref Range Status   Specimen Description BLOOD LEFT ARM  Final   Special Requests   Final    BOTTLES DRAWN AEROBIC AND ANAEROBIC Blood Culture results may not be optimal due to an excessive volume of blood received in culture bottles   Culture   Final    NO GROWTH 5 DAYS Performed at [facility], [address] [place], [place] [address]    Report Status 05/05/2023 FINAL  Final  Blood Culture (routine x 2)     Status: None   Collection Time: 04/30/23  3:56 AM   Specimen: BLOOD RIGHT ARM  Result Value Ref Range Status   Specimen Description BLOOD RIGHT ARM   Final   Special Requests   Final    BOTTLES DRAWN AEROBIC AND ANAEROBIC Blood Culture adequate volume   Culture   Final    NO GROWTH 5 DAYS Performed at [facility], [address] [place], [place] [address]    Report Status 05/05/2023 FINAL  Final  SARS Coronavirus 2 by RT PCR (hospital order, performed in [facility] hospital lab) *cepheid single result test* Anterior Nasal Swab     Status: None   Collection Time: 04/30/23  6:50 AM   Specimen: Anterior Nasal Swab  Result Value Ref Range Status   SARS Coronavirus 2 by RT PCR NEGATIVE NEGATIVE Final    Comment: Performed at [facility], [address] [place], [place] [address]  Gastrointestinal Panel by PCR , Stool     Status: None   Collection Time: 05/02/23 12:06 AM   Specimen: Stool  Result Value Ref Range Status   Campylobacter species NOT DETECTED NOT DETECTED Final   Plesimonas shigelloides NOT DETECTED NOT DETECTED Final   Salmonella species NOT DETECTED NOT DETECTED Final   Yersinia enterocolitica NOT DETECTED NOT DETECTED Final   Vibrio species NOT DETECTED NOT DETECTED Final   Vibrio cholerae NOT DETECTED NOT DETECTED Final   Enteroaggregative E coli (EAEC) NOT DETECTED NOT DETECTED Final   Enteropathogenic E coli (EPEC) NOT DETECTED NOT DETECTED Final   Enterotoxigenic E coli (ETEC) NOT DETECTED NOT DETECTED Final   Shiga like toxin producing E coli (STEC) NOT DETECTED NOT DETECTED Final   Shigella/Enteroinvasive E coli (EIEC) NOT DETECTED NOT DETECTED Final   Cryptosporidium NOT DETECTED NOT DETECTED Final   Cyclospora cayetanensis NOT DETECTED NOT DETECTED Final   Entamoeba histolytica NOT DETECTED NOT DETECTED Final   Giardia lamblia NOT DETECTED NOT DETECTED Final   Adenovirus F40/41 NOT DETECTED NOT DETECTED Final   Astrovirus NOT DETECTED NOT DETECTED Final   Norovirus GI/GII NOT DETECTED NOT DETECTED Final   Rotavirus A NOT DETECTED NOT DETECTED Final   Sapovirus (I, II, IV, and V) NOT DETECTED  NOT DETECTED Final    Comment: Performed at [facility], [address] [place], [place] [address]  C Difficile Quick Screen w PCR reflex     Status: Abnormal   Collection Time: 05/02/23 12:06 AM   Specimen: STOOL  Result Value Ref Range Status  C Diff antigen POSITIVE (A) NEGATIVE Final   C Diff toxin NEGATIVE NEGATIVE Final   C Diff interpretation Results are indeterminate. See PCR results.  Final    Comment: Performed at [facility], [address] [place], [place] [address]  C. Diff by PCR, Reflexed     Status: None   Collection Time: 05/02/23 12:06 AM  Result Value Ref Range Status   Toxigenic C. Difficile by PCR NEGATIVE NEGATIVE Final    Comment: Patient is colonized with non toxigenic C. difficile. May not need treatment unless significant symptoms are present. Performed at [facility], [address] [place], [place] [address]   Culture, blood (Routine X 2) w Reflex to ID Panel     Status: None (Preliminary result)   Collection Time: 05/03/23  1:02 PM   Specimen: BLOOD LEFT HAND  Result Value Ref Range Status   Specimen Description BLOOD LEFT HAND  Final   Special Requests Blood Culture adequate volume AEROBIC BOTTLE ONLY  Final   Culture   Final    NO GROWTH 3 DAYS Performed at [facility], [address] [place], [place] [address]    Report Status PENDING  Incomplete  Culture, blood (Routine X 2) w Reflex to ID Panel     Status: None (Preliminary result)   Collection Time: 05/03/23  1:05 PM   Specimen: BLOOD LEFT HAND  Result Value Ref Range Status   Specimen Description BLOOD LEFT HAND  Final   Special Requests   Final    AEROBIC BOTTLE ONLY Blood Culture results may not be optimal due to an inadequate volume of blood received in culture bottles   Culture   Final    NO GROWTH 3 DAYS Performed at [facility], [address], [place], [place] [address]    Report Status PENDING  Incomplete         Radiology  Studies: No results found.      Scheduled Meds:  heparin  5,000 Units Subcutaneous Q8H   insulin aspart  0-9 Units Subcutaneous Q4H   insulin glargine-yfgn  5 Units Subcutaneous Daily   pantoprazole (PROTONIX) IV  40 mg Intravenous QHS   Continuous Infusions:  anticoagulant sodium citrate     cefTRIAXone (ROCEPHIN)  IV Stopped (05/07/23 0230)   dextrose 45 mL/hr at 05/07/23 0700   metronidazole Stopped (05/07/23 0300)     LOS: 7 days    Time spent: 35 minutes    Ausencio Vaden A Jini Horiuchi, MD [facility]   If 7PM-7AM, please contact night-coverage [email or web address]  05/07/2023, 8:16 AM

## 2023-05-08 DIAGNOSIS — Z7189 Other specified counseling: Secondary | ICD-10-CM

## 2023-05-08 DIAGNOSIS — Z789 Other specified health status: Secondary | ICD-10-CM

## 2023-05-08 DIAGNOSIS — Z515 Encounter for palliative care: Secondary | ICD-10-CM

## 2023-05-08 DIAGNOSIS — K56609 Unspecified intestinal obstruction, unspecified as to partial versus complete obstruction: Secondary | ICD-10-CM | POA: Diagnosis not present

## 2023-05-08 LAB — CBC
HCT: 23.3 % — ABNORMAL LOW (ref 36.0–46.0)
Hemoglobin: 7.4 g/dL — ABNORMAL LOW (ref 12.0–15.0)
MCH: 26.3 pg (ref 26.0–34.0)
MCHC: 31.8 g/dL (ref 30.0–36.0)
MCV: 82.9 fL (ref 80.0–100.0)
Platelets: 297 10*3/uL (ref 150–400)
RBC: 2.81 MIL/uL — ABNORMAL LOW (ref 3.87–5.11)
RDW: 19.2 % — ABNORMAL HIGH (ref 11.5–15.5)
WBC: 14 10*3/uL — ABNORMAL HIGH (ref 4.0–10.5)
nRBC: 0 % (ref 0.0–0.2)

## 2023-05-08 LAB — CULTURE, BLOOD (ROUTINE X 2)
Culture: NO GROWTH
Culture: NO GROWTH
Special Requests: ADEQUATE — AB

## 2023-05-08 LAB — RENAL FUNCTION PANEL
Albumin: 1.6 g/dL — ABNORMAL LOW (ref 3.5–5.0)
Anion gap: 17 — ABNORMAL HIGH (ref 5–15)
BUN: 38 mg/dL — ABNORMAL HIGH (ref 6–20)
CO2: 23 mmol/L (ref 22–32)
Calcium: 7.9 mg/dL — ABNORMAL LOW (ref 8.9–10.3)
Chloride: 90 mmol/L — ABNORMAL LOW (ref 98–111)
Creatinine, Ser: 4.76 mg/dL — ABNORMAL HIGH (ref 0.44–1.00)
GFR, Estimated: 11 mL/min — ABNORMAL LOW (ref >60)
Glucose, Bld: 87 mg/dL (ref 70–99)
Phosphorus: 5.2 mg/dL — ABNORMAL HIGH (ref 2.5–4.6)
Potassium: 3.5 mmol/L (ref 3.5–5.1)
Sodium: 130 mmol/L — ABNORMAL LOW (ref 135–145)

## 2023-05-08 LAB — GLUCOSE, CAPILLARY
Glucose-Capillary: 104 mg/dL — ABNORMAL HIGH (ref 70–99)
Glucose-Capillary: 105 mg/dL — ABNORMAL HIGH (ref 70–99)
Glucose-Capillary: 113 mg/dL — ABNORMAL HIGH (ref 70–99)
Glucose-Capillary: 79 mg/dL (ref 70–99)
Glucose-Capillary: 81 mg/dL (ref 70–99)
Glucose-Capillary: 84 mg/dL (ref 70–99)

## 2023-05-08 MED ORDER — SODIUM CHLORIDE 0.9 % IV SOLN
2.0000 g | INTRAVENOUS | Status: AC
  Administered 2023-05-08 – 2023-05-11 (×4): 2 g via INTRAVENOUS
  Filled 2023-05-08 (×4): qty 20

## 2023-05-08 MED ORDER — METRONIDAZOLE 500 MG/100ML IV SOLN
500.0000 mg | Freq: Two times a day (BID) | INTRAVENOUS | Status: AC
  Administered 2023-05-08 – 2023-05-11 (×7): 500 mg via INTRAVENOUS
  Filled 2023-05-08 (×7): qty 100

## 2023-05-08 MED ORDER — GLUCERNA SHAKE PO LIQD
237.0000 mL | Freq: Three times a day (TID) | ORAL | Status: DC
  Administered 2023-05-09 – 2023-05-16 (×10): 237 mL via ORAL
  Filled 2023-05-08 (×3): qty 237

## 2023-05-08 NOTE — Plan of Care (Signed)
Patient is refusing most of her care att his point. She has been speaking of wanting to be comfortable and she is ready to die. States she has suffered enough. Spoke with nurse in HD as well. Palliative was consulted but pt was sleeping and asked them to come back. Attempts at repositioning, sacral dsg change, abd dsg change and IV dsg change all refused. She even told me not to empty her colostomy. Explained to pt why all of those were important and patient would only allow me to empty her colostomy. Messaged attending to update. I over heard pt speaking with a friend or family member on the phone telling them she was done and just wanted to go away.

## 2023-05-08 NOTE — Consult Note (Signed)
WOC Nurse Consult Note: Refused Wound VAC once more.  Bedside RN indicated patient wishes to go Palliative care. Consult has been placed.  NS moist gauze dressing in place and will remain wound care orders . WOC Nurse ostomy follow up Stoma type/location: RMQ ileostomy, refusing pouch change  Stomal assessment/size: appears pink and moist productive Peristomal assessment: not assessed  refusing ostomy care or teaching.  Treatment options for stomal/peristomal skin: 1 piece convex and barrier ring on 05/06/23 Output soft brown stool Ostomy pouching: 1pc. Education provided: patient refuses care today.  None Enrolled patient in [employer] Discharge program: Yes Will follow.  Mike Gip MSN, RN, FNP-BC CWON Wound, Ostomy, Continence Nurse Outpatient [facility] [phone] Pager [phone]

## 2023-05-08 NOTE — Consult Note (Signed)
Consultation Note Date: 05/08/2023   Patient Name: [name]  DOB: [birth year]  MRN: [id number]  Age / Sex: 42 y.o., female  PCP: [facility] Referring Physician: [place], Eric J, DO  Reason for Consultation: Establishing goals of care; "66F w/ PVD s/p bilateral BKA, ESRD on HD admitted with SBO/ischemic bowel s/p ileostomy.  Overall long-term prognosis poor.  Please assist with goals of care/medical decision making"  HPI/Patient Profile: 42 y.o. 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 Patient was admitted on 04/30/2023 with partial small bowel obstruction and colitis, and sepsis.  Of note patient has had 2 admissions and 1 ED visit in the last 6 months. She has had previous GOC with PMT indicating wish for full code/full scope care.  Clinical Assessment and Goals of Care: I have reviewed medical records including EPIC notes, labs, and imaging. Received report from primary RN - no acute concerns. RN reports patient has stated several times that she "doesn't want to be here" and "wants to be comfortable" also indicating she doesn't not want any further HD.   Went to visit patient at bedside - no family/visitors present. Patient was lying in bed asleep - she does easily wake to voice/gentle touch. When awake she is alert, oriented, and able to participate in conversation; though tells me "No, no - I am tired, I don't want to talk now, come back tomorrow." No signs or non-verbal gestures of pain or discomfort noted. No respiratory distress, increased work of breathing,  or secretions noted.   I re-introduced Palliative Medicine as specialized medical care for people living with serious illness. It focuses on providing relief from the symptoms and stress of a serious illness. The goal is to improve quality of life for both the patient and the family.  I asked her about the conversations with her primary RN today - wanting to be comfortable, no further HD, and ask if this is something we can discuss today. Patient becomes tearful and tells me "yes, that's what I want. But I don't want to talk today." She again asks me to return tomorrow. I confirmed I could return to discuss her thoughts/wishes tomorrow. I asked patient if she would like me to call her sister/[name] - she stated "no not today."  Questions and concerns were addressed.   Primary Decision Maker: PATIENT    SUMMARY OF RECOMMENDATIONS   Patient has indicated to nursing staff she is interested in "being comfortable" with no further HD. Patient did not want to complete GOC today - requested PMT return tomorrow 9/26 Will follow up tomorrow  PMT will continue to follow and support holistically    Code Status/Advance Care Planning: Full code  Palliative Prophylaxis:  Aspiration, Bowel Regimen, Frequent Pain Assessment, Oral Care, and Turn Reposition  Additional Recommendations (Limitations, Scope, Preferences): Full Scope Treatment  Psycho-social/Spiritual:  Desire for further Chaplaincy support:no Created space and opportunity for patient to express thoughts and feelings regarding patient's current medical situation.  Emotional support and therapeutic listening provided.  Prognosis:  Unable to determine  Discharge Planning: To Be Determined      Primary Diagnoses: Present on Admission:  SBO (small bowel obstruction) (HCC)  Ischemic  disease of gut (HCC)   I have reviewed the medical record, interviewed the patient and family, and examined the patient. The following aspects are  pertinent.  Past Medical History:  Diagnosis Date   Anemia    Anxiety    Blind right eye 2008   Diabetes mellitus without complication (HCC)    Embolic stroke (HCC)    ESRD (end stage renal disease) (HCC)    HD on M/W/F   ESRD on hemodialysis (HCC)    GBS (Guillain Barre syndrome) (HCC)    Social History   Socioeconomic History   Marital status: Single    Spouse name: Not on file   Number of children: Not on file   Years of education: Not on file   Highest education level: Not on file  Occupational History   Not on file  Tobacco Use   Smoking status: Never   Smokeless tobacco: Never  Vaping Use   Vaping status: Never Used  Substance and Sexual Activity   Alcohol use: No   Drug use: No   Sexual activity: Not on file  Other Topics Concern   Not on file  Social History Narrative   Not on file   Social Determinants of Health   Financial Resource Strain: Not on file  Food Insecurity: No Food Insecurity (04/30/2023)   Hunger Vital Sign    Worried About Running Out of Food in the Last Year: Never true    Ran Out of Food in the Last Year: Never true  Transportation Needs: No Transportation Needs (04/30/2023)   PRAPARE - Administrator, Civil Service (Medical): No    Lack of Transportation (Non-Medical): No  Physical Activity: Not on file  Stress: Not on file  Social Connections: Not on file   Family History  Problem Relation Age of Onset   Diabetes Mother    Diabetes Father    Scheduled Meds:  Chlorhexidine Gluconate Cloth  6 each Topical Q0600   [START ON 05/10/2023] darbepoetin (ARANESP) injection - DIALYSIS  60 mcg Subcutaneous Q Fri-1800   doxercalciferol  4 mcg Intravenous Q M,W,F-HD   feeding supplement (GLUCERNA SHAKE)  237 mL Oral TID BM   heparin  5,000 Units Subcutaneous Q8H   insulin aspart  0-9 Units Subcutaneous Q4H   insulin glargine-yfgn  5 Units Subcutaneous Daily   pantoprazole (PROTONIX) IV  40 mg Intravenous QHS   Continuous  Infusions:  anticoagulant sodium citrate     metronidazole 500 mg (05/08/23 1327)   PRN Meds:.acetaminophen **OR** acetaminophen, albuterol, alteplase, anticoagulant sodium citrate, bisacodyl, heparin, hydrALAZINE, HYDROcodone-acetaminophen, HYDROmorphone (DILAUDID) injection, lidocaine (PF), lidocaine-prilocaine, ondansetron **OR** ondansetron (ZOFRAN) IV, pentafluoroprop-tetrafluoroeth Medications Prior to Admission:  Prior to Admission medications   Medication Sig Start Date End Date Taking? Authorizing Provider  acetaminophen (TYLENOL) 325 MG tablet Take 650 mg by mouth every 6 (six) hours as needed for fever or mild pain.   Yes [provider]  aspirin EC 81 MG tablet Take 1 tablet (81 mg total) by mouth daily. Swallow whole. 07/27/22  Yes Vann, Jessica U, DO  atorvastatin (LIPITOR) 40 MG tablet Place 1 tablet (40 mg total) into feeding tube daily. Patient taking differently: Take 40 mg by mouth daily. 10/26/21  Yes Burnadette Pop, MD  azelastine (OPTIVAR) 0.05 % ophthalmic solution Place 1 drop into both eyes 2 (two) times daily as needed (allergies). 11/13/22  Yes [provider]  cetirizine (ZYRTEC) 10 MG tablet Take 10 mg by mouth daily.   Yes  [provider]  cinacalcet (SENSIPAR) 90 MG tablet Take 90 mg by mouth daily. 09/22/19  Yes [provider]  docusate sodium (COLACE) 100 MG capsule Take 100 mg by mouth at bedtime.   Yes [provider]  dorzolamide-timolol (COSOPT) 22.3-6.8 MG/ML ophthalmic solution Place 1 drop into the left eye 2 (two) times daily. 03/30/21  Yes [provider]  insulin aspart (NOVOLOG) 100 UNIT/ML injection Inject 0-6 Units into the skin every 4 (four) hours. Patient taking differently: Inject 0-10 Units into the skin 3 (three) times daily with meals. 121-150=1 unit 152-200=2 units 201-250=3 units 251-300=5 units 301-350=7 units 351-400=10 units 10/25/21  Yes Adhikari, Amrit, MD  insulin glargine (SEMGLEE)  100 UNIT/ML injection Inject 0.1 mLs (10 Units total) into the skin daily at 6 PM. Patient taking differently: Inject 12 Units into the skin 2 (two) times daily. 12/28/22  Yes Dahal, Melina Schools, MD  isosorbide mononitrate (IMDUR) 30 MG 24 hr tablet Take 1 tablet (30 mg total) by mouth daily. 07/27/22  Yes Vann, Jessica U, DO  melatonin 3 MG TABS tablet Take 3 mg by mouth at bedtime.   Yes [provider]  metoprolol tartrate (LOPRESSOR) 25 MG tablet Take 0.5 tablets (12.5 mg total) by mouth 2 (two) times daily. 07/27/22  Yes Vann, Jessica U, DO  midodrine (PROAMATINE) 5 MG tablet Take 5 mg by mouth See admin instructions. Take 5mg  (1 tablet) by mouth on Monday, Wednesdays, and Fridays. In addition, take 5mg  (1 tablet) every 8 hours as needed for hypotension.   Yes [provider]  Multiple Vitamin (MULTIVITAMIN WITH MINERALS) TABS tablet Take 1 tablet by mouth daily.   Yes [provider]  Nutritional Supplements (NUTRITIONAL DRINK) LIQD Take 120 mLs by mouth daily.   Yes [provider]  ondansetron (ZOFRAN) 4 MG tablet Take 4 mg by mouth every 6 (six) hours as needed for nausea/vomiting. 03/02/22  Yes [provider]  oxyCODONE-acetaminophen (PERCOCET) 5-325 MG tablet Take 1 tablet by mouth every 8 (eight) hours as needed for severe pain. Patient taking differently: Take 1 tablet by mouth every 6 (six) hours as needed for severe pain. 07/27/22  Yes Vann, Jessica U, DO  polyethylene glycol (MIRALAX / GLYCOLAX) 17 g packet Take 17 g by mouth daily at 6 PM.   Yes [provider]  thiamine 100 MG tablet Place 1 tablet (100 mg total) into feeding tube daily. Patient taking differently: Take 100 mg by mouth daily. 10/26/21  Yes Burnadette Pop, MD  Accu-Chek FastClix Lancets MISC Use as instructed to check blood sugar up to TID. E11.22 Patient taking differently: 1 each by Other route See admin instructions. Use as instructed to check blood sugar up to TID.  E11.22 04/15/19   Hoy Register, MD  Blood Glucose Monitoring Suppl (ACCU-CHEK GUIDE ME) w/Device KIT 1 kit by Does not apply route 3 (three) times daily. Use to check BG at home up to 3 times daily. E11.22 04/15/19   Hoy Register, MD  Continuous Blood Gluc Transmit (DEXCOM G6 TRANSMITTER) MISC 1 Device by Does not apply route as directed. 09/12/20   Shamleffer, Konrad Dolores, MD  glucose blood (ACCU-CHEK GUIDE) test strip Use as instructed to check blood sugar up to TID. E11.22 Patient taking differently: 1 each by Other route See admin instructions. Use as instructed to check blood sugar up to TID. E11.22 07/17/19   Fulp, Cammie, MD  oxyCODONE (OXY IR/ROXICODONE) 5 MG immediate release tablet Take 5 mg by mouth daily  as needed for severe pain. Patient not taking: Reported on 04/30/2023    [provider]   Allergies  Allergen Reactions   Peanut-Containing Drug Products Anaphylaxis and Hives   Penicillins Rash and Other (See Comments)    Rash in 2008.  Tolerated cefazolin in 2020. No reaction listed on MAR.   Shellfish Allergy Swelling   Chocolate Hives   Review of Systems  Unable to perform ROS: Other    Physical Exam Vitals and nursing note reviewed.  Constitutional:      General: She is not in acute distress. Pulmonary:     Effort: No respiratory distress.  Musculoskeletal:     Right Lower Extremity: Right leg is amputated below knee.     Left Lower Extremity: Left leg is amputated below knee.  Skin:    General: Skin is warm and dry.  Neurological:     Mental Status: She is alert and oriented to person, place, and time.     Motor: Weakness present.  Psychiatric:        Attention and Perception: Attention normal.        Behavior: Behavior is cooperative.        Cognition and Memory: Cognition and memory normal.     Vital Signs: BP 119/66   Pulse 92   Temp 98 F (36.7 C) (Oral)   Resp 12   Wt 50.8 kg   SpO2 94%   BMI 21.16 kg/m  Pain Scale: 0-10 POSS *See  Group Information*: 1-Acceptable,Awake and alert Pain Score: Asleep   SpO2: SpO2: 94 % O2 Device:SpO2: 94 % O2 Flow Rate: .O2 Flow Rate (L/min): 2 L/min  IO: Intake/output summary:  Intake/Output Summary (Last 24 hours) at 05/08/2023 1332 Last data filed at 05/08/2023 1105 Gross per 24 hour  Intake 686.25 ml  Output 1200 ml  Net -513.75 ml    LBM: Last BM Date : 05/07/23 Baseline Weight: Weight: 51.3 kg Most recent weight: Weight: 50.8 kg     Palliative Assessment/Data: PPS 50%     Time In: 1330 Time Out: 1430 Time Total: 60 minutes  Greater than 50%  of this time was spent counseling and coordinating care related to the above assessment and plan.  Signed by: Haskel Khan, NP   Please contact Palliative Medicine Team phone at [phone] for questions and concerns.  For individual provider: See Amion  *Portions of this note are a verbal dictation therefore any spelling and/or grammatical errors are due to the "Dragon [employer] One" system interpretation.

## 2023-05-08 NOTE — Consult Note (Signed)
WOC Nurse Consult Note: Patient is refusing NPWT (VAC) therapy.  She states she "doesn't want to be here".  I explain that the [facility] would go with her to rehab/SNF.  She states she doesn't want to go there either.  I let her know that I will come back in one hour, after she has rested from dialysis this AM and we will try again.  I inform Brooke, [place] CCS that patient is refusing but I will try once more in an hour.   Will follow for ostomy care and wound care if [facility] is initiated.  Mike Gip MSN, RN, FNP-BC CWON Wound, Ostomy, Continence Nurse Outpatient [facility] [phone] Pager [phone]

## 2023-05-08 NOTE — Progress Notes (Signed)
PROGRESS NOTE    [name]  ZOX:[id number] DOB: Mar 29, 1981 DOA: 04/30/2023 PCP: [facility], [facility]    Brief Narrative:   [name] is a 42 y.o. 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  Onset day prior to admission associated with diarrhea.  In the ED, patient was febrile, tachycardic with lactic acid 1.9 followed by 2.5.  Chest x-ray with findings of atelectasis versus infiltrate.  CT abdomen/pelvis with contrast shows evidence of a small bowel obstruction with dilated ileal segment up to 4 cm, sigmoid segment consistent with colitis, diffuse gastric wall thickening, cholelithiasis with mild chronic prominence of the common bile duct.  General surgery was consulted.  TRH consulted for admission for further evaluation and management of small bowel obstruction.   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  Patient's symptoms continue to progress despite IV antibiotics and conservative measures.  C. difficile antigen positive, toxin negative.  GI PCR panel negative.  Patient underwent diagnostic laparoscopy with extended ileus cecotomy and end ileostomy on 05/04/2023 by Dr. Hillery Hunter.  -- General Surgery following, appreciate assistance -- WBC 9.7>>15.5>>10.5>14.0 -- Will complete IV antibiotics with ceftriaxone and metronidazole today per infectious disease -- Diet advanced to regular, tolerating -- Continue  monitor ostomy output; 1.1L last 24h -- General Surgery ordering wound VAC today -- CBC daily  Type 1 diabetes mellitus Hemoglobin A1c 5.7, well-controlled.  Home regimen clued Semglee 12 units [place] BID and NovoLog SSI.  Peripheral vascular disease s/p bilateral BKA -- Continue to hold home statin for now  ESRD on HD MWF -- Nephrology following, appreciate assistance -- HD today  Essential hypertension Home regimen includes isosorbide mononitrate 30 mg p.o. daily. -- Continue to hold given hypotension earlier during admission.  Hyperlipidemia -- Hold home atorvastatin 40 mg p.o. daily  Anemia of chronic medical/renal disease -- Hgb 10.1>>11.4>>7.4>9.0>7.4 -- Transfuse for hemoglobin less than 7.0  DVT prophylaxis: heparin injection 5,000 Units Start: 04/30/23 0900    Code Status: Full Code Family Communication: No family present at bedside this morning  Disposition Plan:  Level of care: Progressive Status is: Inpatient Remains inpatient appropriate because: Awaiting general surgery signed off; plan to return back to SNF    Consultants:  General surgery Infectious disease PCCM Nephrology  Procedures:  Diagnostic laparoscopy with extended ileocecostomy and end ileostomy, Dr Hillery Hunter; 9/21  Antimicrobials:  Metronidazole 9/17 - 9/18, 9/20 - 9/25 Ciprofloxacin 9/17 Ceftriaxone 9/20 - 9/25   Subjective: Patient seen examined bedside, resting calmly.  Lying in bed.  Complaining of some mild pain surrounding ostomy site.  Awaiting dialysis later today.  Tolerating regular diet.  No other specific complaints or concerns at this time.  Denies headache, no chest pain, no shortness of breath, no fever.   Objective: Vitals:   05/08/23 0915 05/08/23 0920 05/08/23 0930 05/08/23 0943  BP: (!) 84/56 (!) 86/54 (!) 89/58 100/66  Pulse:   81 86  Resp: (!) 9 10 12 12   Temp:      TempSrc:  SpO2: 100% 100% 99% 98%  Weight:        Intake/Output Summary (Last 24 hours) at  05/08/2023 0950 Last data filed at 05/08/2023 0323 Gross per 24 hour  Intake 686.25 ml  Output 1100 ml  Net -413.75 ml   Filed Weights   05/07/23 0609 05/08/23 0500 05/08/23 0820  Weight: 51.5 kg 49.6 kg 50.8 kg    Examination:  Physical Exam: GEN: NAD, chronically ill appearance HEENT: NCAT, PERRL, EOMI, sclera clear PULM: CTAB w/o wheezes/crackles, normal respiratory effort, on room air CV: RRR w/o M/G/R GI: abd soft, NTND, NABS, noted open the midline wound with dressing in place, ostomy noted with soft brown stool in bag MSK: Noted bilateral BKA PSYCH: Depressed mood, flat affect     Data Reviewed: I have personally reviewed following labs and imaging studies  CBC: Recent Labs  Lab 05/03/23 1302 05/03/23 1412 05/05/23 0623 05/06/23 0239 05/07/23 0758 05/08/23 0846  WBC 9.3 14.3* 15.5* 14.4* 10.5 14.0*  NEUTROABS 7.6 11.8*  --   --   --   --   HGB 11.4* 9.5* 8.4* 7.4* 9.0* 7.4*  HCT 36.2 29.4* 26.5* 23.1* 28.4* 23.3*  MCV 80.1 82.1 79.6* 81.9 80.5 82.9  PLT 161 232 190 230 267 297   Basic Metabolic Panel: Recent Labs  Lab 05/02/23 1041 05/03/23 1412 05/04/23 0229 05/06/23 0239 05/08/23 0846  NA 134* 130* 133* 129* 130*  K 4.2 4.6 3.7 3.7 3.5  CL 92* 89* 93* 94* 90*  CO2 27 23 24 22 23   GLUCOSE 149* 104* 130* 132* 87  BUN 32* 45* 20 51* 38*  CREATININE 4.13* 5.49* 2.90* 4.78* 4.76*  CALCIUM 8.1* 8.9 8.7* 7.7* 7.9*  PHOS  --   --   --  4.3 5.2*   GFR: Estimated Creatinine Clearance: 11.7 mL/min (A) (by C-G formula based on SCr of 4.76 mg/dL (H)). Liver Function Tests: Recent Labs  Lab 05/01/23 1020 05/06/23 0239 05/08/23 0846  AST 26  --   --   ALT 18  --   --   ALKPHOS 72  --   --   BILITOT 0.9  --   --   PROT 6.2*  --   --   ALBUMIN 2.1* 1.8* 1.6*   No results for input(s): "LIPASE", "AMYLASE" in the last 168 hours. No results for input(s): "AMMONIA" in the last 168 hours. Coagulation Profile: No results for input(s): "INR", "PROTIME"  in the last 168 hours. Cardiac Enzymes: No results for input(s): "CKTOTAL", "CKMB", "CKMBINDEX", "TROPONINI" in the last 168 hours. BNP (last 3 results) No results for input(s): "PROBNP" in the last 8760 hours. HbA1C: No results for input(s): "HGBA1C" in the last 72 hours. CBG: Recent Labs  Lab 05/07/23 1613 05/07/23 2027 05/07/23 2336 05/08/23 0357 05/08/23 0759  GLUCAP 125* 80 102* 84 81   Lipid Profile: No results for input(s): "CHOL", "HDL", "LDLCALC", "TRIG", "CHOLHDL", "LDLDIRECT" in the last 72 hours. Thyroid Function Tests: No results for input(s): "TSH", "T4TOTAL", "FREET4", "T3FREE", "THYROIDAB" in the last 72 hours. Anemia Panel: No results for input(s): "VITAMINB12", "FOLATE", "FERRITIN", "TIBC", "IRON", "RETICCTPCT" in the last 72 hours. Sepsis Labs: Recent Labs  Lab 05/01/23 1020 05/02/23 1041 05/02/23 1614 05/05/23 0623  PROCALCITON 32.25  --   --   --   LATICACIDVEN 2.3* 2.2* 1.9 1.4    Recent Results (from the past 240 hour(s))  Blood Culture (routine x 2)     Status: None   Collection Time: 04/30/23  3:56  AM   Specimen: BLOOD LEFT ARM  Result Value Ref Range Status   Specimen Description BLOOD LEFT ARM  Final   Special Requests   Final    BOTTLES DRAWN AEROBIC AND ANAEROBIC Blood Culture results may not be optimal due to an excessive volume of blood received in culture bottles   Culture   Final    NO GROWTH 5 DAYS Performed at [facility], [address], [place], [place] [address]    Report Status 05/05/2023 FINAL  Final  Blood Culture (routine x 2)     Status: None   Collection Time: 04/30/23  3:56 AM   Specimen: BLOOD RIGHT ARM  Result Value Ref Range Status   Specimen Description BLOOD RIGHT ARM  Final   Special Requests   Final    BOTTLES DRAWN AEROBIC AND ANAEROBIC Blood Culture adequate volume   Culture   Final    NO GROWTH 5 DAYS Performed at [facility], [address], [place], [place] [address]    Report Status  05/05/2023 FINAL  Final  SARS Coronavirus 2 by RT PCR (hospital order, performed in [facility] hospital lab) *cepheid single result test* Anterior Nasal Swab     Status: None   Collection Time: 04/30/23  6:50 AM   Specimen: Anterior Nasal Swab  Result Value Ref Range Status   SARS Coronavirus 2 by RT PCR NEGATIVE NEGATIVE Final    Comment: Performed at [facility], [address], [place], [place] [address]  Gastrointestinal Panel by PCR , Stool     Status: None   Collection Time: 05/02/23 12:06 AM   Specimen: Stool  Result Value Ref Range Status   Campylobacter species NOT DETECTED NOT DETECTED Final   Plesimonas shigelloides NOT DETECTED NOT DETECTED Final   Salmonella species NOT DETECTED NOT DETECTED Final   Yersinia enterocolitica NOT DETECTED NOT DETECTED Final   Vibrio species NOT DETECTED NOT DETECTED Final   Vibrio cholerae NOT DETECTED NOT DETECTED Final   Enteroaggregative E coli (EAEC) NOT DETECTED NOT DETECTED Final   Enteropathogenic E coli (EPEC) NOT DETECTED NOT DETECTED Final   Enterotoxigenic E coli (ETEC) NOT DETECTED NOT DETECTED Final   Shiga like toxin producing E coli (STEC) NOT DETECTED NOT DETECTED Final   Shigella/Enteroinvasive E coli (EIEC) NOT DETECTED NOT DETECTED Final   Cryptosporidium NOT DETECTED NOT DETECTED Final   Cyclospora cayetanensis NOT DETECTED NOT DETECTED Final   Entamoeba histolytica NOT DETECTED NOT DETECTED Final   Giardia lamblia NOT DETECTED NOT DETECTED Final   Adenovirus F40/41 NOT DETECTED NOT DETECTED Final   Astrovirus NOT DETECTED NOT DETECTED Final   Norovirus GI/GII NOT DETECTED NOT DETECTED Final   Rotavirus A NOT DETECTED NOT DETECTED Final   Sapovirus (I, II, IV, and V) NOT DETECTED NOT DETECTED Final    Comment: Performed at [facility], [address] [place], [place] [address]  C Difficile Quick Screen w PCR reflex     Status: Abnormal   Collection Time: 05/02/23 12:06 AM   Specimen: STOOL  Result  Value Ref Range Status   C Diff antigen POSITIVE (A) NEGATIVE Final   C Diff toxin NEGATIVE NEGATIVE Final   C Diff interpretation Results are indeterminate. See PCR results.  Final    Comment: Performed at [facility], [address] [place], [place] [address]  C. Diff by PCR, Reflexed     Status: None   Collection Time: 05/02/23 12:06  AM  Result Value Ref Range Status   Toxigenic C. Difficile by PCR NEGATIVE NEGATIVE Final    Comment: Patient is colonized with non toxigenic C. difficile. May not need treatment unless significant symptoms are present. Performed at [facility], [address], [place], [place] [address]   Culture, blood (Routine X 2) w Reflex to ID Panel     Status: None   Collection Time: 05/03/23  1:02 PM   Specimen: BLOOD LEFT HAND  Result Value Ref Range Status   Specimen Description BLOOD LEFT HAND  Final   Special Requests Blood Culture adequate volume AEROBIC BOTTLE ONLY  Final   Culture   Final    NO GROWTH 5 DAYS Performed at [facility], [address] [place], [place] [address]    Report Status 05/08/2023 FINAL  Final  Culture, blood (Routine X 2) w Reflex to ID Panel     Status: None   Collection Time: 05/03/23  1:05 PM   Specimen: BLOOD LEFT HAND  Result Value Ref Range Status   Specimen Description BLOOD LEFT HAND  Final   Special Requests   Final    AEROBIC BOTTLE ONLY Blood Culture results may not be optimal due to an inadequate volume of blood received in culture bottles   Culture   Final    NO GROWTH 5 DAYS Performed at [facility], [address] [place], [place] [address]    Report Status 05/08/2023 FINAL  Final         Radiology Studies: No results found.      Scheduled Meds:  Chlorhexidine Gluconate Cloth  6 each Topical Q0600   [START ON 05/10/2023] darbepoetin (ARANESP) injection - DIALYSIS  60 mcg Subcutaneous Q Fri-1800   doxercalciferol  4 mcg Intravenous Q M,W,F-HD   feeding supplement  (GLUCERNA SHAKE)  237 mL Oral TID BM   heparin  5,000 Units Subcutaneous Q8H   insulin aspart  0-9 Units Subcutaneous Q4H   insulin glargine-yfgn  5 Units Subcutaneous Daily   pantoprazole (PROTONIX) IV  40 mg Intravenous QHS   Continuous Infusions:  anticoagulant sodium citrate     metronidazole Stopped (05/08/23 0246)     LOS: 8 days    Time spent: 53 minutes spent on chart review, discussion with nursing staff, consultants, updating family and interview/physical exam; more than 50% of that time was spent in counseling and/or coordination of care.    Alvira Philips [place], DO [facility] Available via Epic secure chat 7am-7pm After these hours, please refer to coverage provider listed on [email or web address] 05/08/2023, 9:50 AM

## 2023-05-08 NOTE — Plan of Care (Signed)

## 2023-05-08 NOTE — Progress Notes (Signed)
Having to spot check pulse ox due to monitor not picking up. 91-95% on potable pulse-ox

## 2023-05-08 NOTE — Procedures (Signed)
Patient was seen on dialysis and the procedure was supervised.  BFR 400  Via AVF BP is  100/66.   Patient appears to be tolerating treatment well. D/w HD nurse.   [name] 05/08/2023

## 2023-05-08 NOTE — Progress Notes (Signed)
Patient ID: [name], female   DOB: [birth year], 42 y.o.   MRN: [id number] [facility] Surgery Progress Note  4 Days Post-Op  Subjective: CC-  Seen in HD. Complaining of abdominal pain, about the same as yesterday. Not eating. Denies n/v but does not have an appetite. Ostomy productive, 1.1L last 24hr.   Objective: Vital signs in last 24 hours: Temp:  [97.7 F (36.5 C)-98.6 F (37 C)] 98.3 F (36.8 C) (09/25 0820) Pulse Rate:  [78-86] 86 (09/25 0943) Resp:  [9-18] 12 (09/25 0943) BP: (84-119)/(54-69) 100/66 (09/25 0943) SpO2:  [95 %-100 %] 98 % (09/25 0943) Weight:  [49.6 kg-50.8 kg] 50.8 kg (09/25 0820) Last BM Date : 05/07/23  Intake/Output from previous day: 09/24 0701 - 09/25 0700 In: 686.3 [I.V.:386.3; IV Piggyback:300] Out: 1100 [Stool:1100] Intake/Output this shift: No intake/output data recorded.  PE: Gen:  Alert, NAD Ad: soft, nondistended, open midline pink with scan blue/green drainage on inferior aspect of packing, ostomy viable with air and stool in bag  Lab Results:  Recent Labs    05/07/23 0758 05/08/23 0846  WBC 10.5 14.0*  HGB 9.0* 7.4*  HCT 28.4* 23.3*  PLT 267 297   BMET Recent Labs    05/06/23 0239 05/08/23 0846  NA 129* 130*  K 3.7 3.5  CL 94* 90*  CO2 22 23  GLUCOSE 132* 87  BUN 51* 38*  CREATININE 4.78* 4.76*  CALCIUM 7.7* 7.9*   PT/INR No results for input(s): "LABPROT", "INR" in the last 72 hours. CMP     Component Value Date/Time   NA 130 (L) 05/08/2023 0846   NA 133 (L) 05/18/2020 0927   K 3.5 05/08/2023 0846   CL 90 (L) 05/08/2023 0846   CO2 23 05/08/2023 0846   GLUCOSE 87 05/08/2023 0846   BUN 38 (H) 05/08/2023 0846   BUN 45 (H) 05/18/2020 0927   CREATININE 4.76 (H) 05/08/2023 0846   CALCIUM 7.9 (L) 05/08/2023 0846   CALCIUM 12.1 (H) 11/13/2021 2026   PROT 6.2 (L) 05/01/2023 1020   PROT 7.6 05/18/2020 0927   ALBUMIN 1.6 (L) 05/08/2023 0846   ALBUMIN 3.9 05/18/2020 0927   AST 26 05/01/2023 1020   ALT 18  05/01/2023 1020   ALKPHOS 72 05/01/2023 1020   BILITOT 0.9 05/01/2023 1020   BILITOT <0.2 05/18/2020 0927   GFRNONAA 11 (L) 05/08/2023 0846   GFRAA 6 (L) 05/18/2020 0927   Lipase     Component Value Date/Time   LIPASE 40 12/20/2022 0918       Studies/Results: No results found.  Anti-infectives: Anti-infectives (From admission, onward)    Start     Dose/Rate Route Frequency Ordered Stop   05/04/23 0200  cefTRIAXone (ROCEPHIN) 2 g in sodium chloride 0.9 % 100 mL IVPB        2 g 200 mL/hr over 30 Minutes Intravenous Every 24 hours 05/04/23 0102 05/08/23 0144   05/04/23 0200  metroNIDAZOLE (FLAGYL) IVPB 500 mg        500 mg 100 mL/hr over 60 Minutes Intravenous Every 12 hours 05/04/23 0102 05/08/23 2359   04/30/23 1800  ciprofloxacin (CIPRO) IVPB 400 mg  Status:  Discontinued        400 mg 200 mL/hr over 60 Minutes Intravenous Every 24 hours 04/30/23 1443 05/02/23 1140   04/30/23 1515  metroNIDAZOLE (FLAGYL) IVPB 500 mg  Status:  Discontinued        500 mg 100 mL/hr over 60 Minutes Intravenous Every 12 hours 04/30/23 1418  05/02/23 1140        Assessment/Plan 42 yo female presenting with abdominal pain, diarrhea, low grade fevers, dilated small bowel and colonic wall thickening on CT   PD#4 s/p diagnostic lap converted to ex-lap with extended ileo-cecectomy (mid-ileum to just past cecum), 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 Hillery Hunter - Intra-op findings were notable for adhesions in the RLQ w/ associated twisting of the mesentery resulting in SB ischemia.  - WOC RN following for new ileostomy. - Will order wound vac today - Continue regular diet. Monitor PO intake. Monitor ileostomy output - 1.1L last 24 hour, if this goes up may need to add medications to slow this down - Monitor WBC 14<<10<<14, afebrile.  - mobilize, continue therapies - rec SNF   FEN: ADAT ID: Rocephin/Flagyl 9/21 >> plan to stop abx 9/25 @ 2359 VTE: SCDs, SQH q 8h Foley: out Dispo: progressive care per  primary    LOS: 8 days    Franne Forts, [facility] Surgery 05/08/2023, 9:45 AM Please see Amion for pager number during day hours 7:00am-4:30pm

## 2023-05-08 NOTE — Progress Notes (Signed)
   05/08/23 1105  Vitals  Temp 98 F (36.7 C)  Pulse Rate 89  Resp 14  BP 115/62  SpO2 98 %  Oxygen Therapy  Patient Activity (if Appropriate) In bed  Post Treatment  Dialyzer Clearance Lightly streaked  Hemodialysis Intake (mL) 0 mL  Liters Processed 22.2  Fluid Removed (mL) 100 mL  Tolerated HD Treatment Yes  AVG/AVF Arterial Site Held (minutes) 10 minutes  AVG/AVF Venous Site Held (minutes) 10 minutes   Received patient in bed to unit.  Alert and oriented.  Informed consent signed and in chart.   TX duration:1.5hrs pt signed off AMA MD notified  Patient tolerated well.  Transported back to the room  Alert, without acute distress.  Hand-off given to patient's nurse.   Access used: RAVG Access issues: high VP  Total UF removed: Medication(s) given: none   [facility]amin[facility] T [facility]

## 2023-05-09 ENCOUNTER — Inpatient Hospital Stay (HOSPITAL_COMMUNITY): Payer: Medicare Other

## 2023-05-09 DIAGNOSIS — Z515 Encounter for palliative care: Secondary | ICD-10-CM | POA: Diagnosis not present

## 2023-05-09 DIAGNOSIS — Z789 Other specified health status: Secondary | ICD-10-CM | POA: Diagnosis not present

## 2023-05-09 DIAGNOSIS — K56609 Unspecified intestinal obstruction, unspecified as to partial versus complete obstruction: Secondary | ICD-10-CM | POA: Diagnosis not present

## 2023-05-09 DIAGNOSIS — Z7189 Other specified counseling: Secondary | ICD-10-CM | POA: Diagnosis not present

## 2023-05-09 LAB — CBC
HCT: 26.1 % — ABNORMAL LOW (ref 36.0–46.0)
Hemoglobin: 8.1 g/dL — ABNORMAL LOW (ref 12.0–15.0)
MCH: 25.6 pg — ABNORMAL LOW (ref 26.0–34.0)
MCHC: 31 g/dL (ref 30.0–36.0)
MCV: 82.6 fL (ref 80.0–100.0)
Platelets: 345 10*3/uL (ref 150–400)
RBC: 3.16 MIL/uL — ABNORMAL LOW (ref 3.87–5.11)
RDW: 19.5 % — ABNORMAL HIGH (ref 11.5–15.5)
WBC: 17.1 10*3/uL — ABNORMAL HIGH (ref 4.0–10.5)
nRBC: 0.2 % (ref 0.0–0.2)

## 2023-05-09 LAB — GLUCOSE, CAPILLARY
Glucose-Capillary: 102 mg/dL — ABNORMAL HIGH (ref 70–99)
Glucose-Capillary: 136 mg/dL — ABNORMAL HIGH (ref 70–99)
Glucose-Capillary: 134 mg/dL — ABNORMAL HIGH (ref 70–99)
Glucose-Capillary: 113 mg/dL — ABNORMAL HIGH (ref 70–99)
Glucose-Capillary: 174 mg/dL — ABNORMAL HIGH (ref 70–99)

## 2023-05-09 LAB — MISC LABCORP TEST (SEND OUT): Labcorp test code: 6510

## 2023-05-09 MED ORDER — IOHEXOL 350 MG/ML SOLN
75.0000 mL | Freq: Once | INTRAVENOUS | Status: AC | PRN
  Administered 2023-05-09: 75 mL via INTRAVENOUS

## 2023-05-09 MED ORDER — CHLORHEXIDINE GLUCONATE CLOTH 2 % EX PADS
6.0000 | MEDICATED_PAD | Freq: Every day | CUTANEOUS | Status: DC
  Administered 2023-05-09 – 2023-05-13 (×4): 6 via TOPICAL

## 2023-05-09 MED ORDER — CALCIUM POLYCARBOPHIL 625 MG PO TABS
625.0000 mg | ORAL_TABLET | Freq: Two times a day (BID) | ORAL | Status: DC
  Administered 2023-05-09 – 2023-05-12 (×8): 625 mg via ORAL
  Filled 2023-05-09 (×9): qty 1

## 2023-05-09 MED ORDER — INSULIN ASPART 100 UNIT/ML IJ SOLN
0.0000 [IU] | Freq: Three times a day (TID) | INTRAMUSCULAR | Status: DC
  Administered 2023-05-09: 1 [IU] via SUBCUTANEOUS
  Administered 2023-05-09 – 2023-05-10 (×2): 2 [IU] via SUBCUTANEOUS

## 2023-05-09 MED ORDER — FERROUS SULFATE 325 (65 FE) MG PO TABS
325.0000 mg | ORAL_TABLET | Freq: Two times a day (BID) | ORAL | Status: DC
  Administered 2023-05-09 – 2023-05-24 (×29): 325 mg via ORAL
  Filled 2023-05-09 (×30): qty 1

## 2023-05-09 NOTE — Plan of Care (Signed)
Problem: Coping: Goal: Ability to adjust to condition or change in health will improve Outcome: Progressing   Problem: Fluid Volume: Goal: Ability to maintain a balanced intake and output will improve Outcome: Progressing   Problem: Health Behavior/Discharge Planning: Goal: Ability to identify and utilize available resources and services will improve Outcome: Progressing Goal: Ability to manage health-related needs will improve Outcome: Progressing   Problem: Metabolic: Goal: Ability to maintain appropriate glucose levels will improve Outcome: Progressing   Problem: Nutritional: Goal: Maintenance of adequate nutrition will improve Outcome: Progressing Goal: Progress toward achieving an optimal weight will improve Outcome: Progressing   Problem: Skin Integrity: Goal: Risk for impaired skin integrity will decrease Outcome: Progressing   Problem: Tissue Perfusion: Goal: Adequacy of tissue perfusion will improve Outcome: Progressing   Problem: Education: Goal: Knowledge of General Education information will improve Description: Including pain rating scale, medication(s)/side effects and non-pharmacologic comfort measures Outcome: Progressing   Problem: Health Behavior/Discharge Planning: Goal: Ability to manage health-related needs will improve Outcome: Progressing   Problem: Clinical Measurements: Goal: Ability to maintain clinical measurements within normal limits will improve Outcome: Progressing Goal: Will remain free from infection Outcome: Progressing Goal: Diagnostic test results will improve Outcome: Progressing Goal: Respiratory complications will improve Outcome: Progressing Goal: Cardiovascular complication will be avoided Outcome: Progressing   Problem: Activity: Goal: Risk for activity intolerance will decrease Outcome: Progressing   Problem: Nutrition: Goal: Adequate nutrition will be maintained Outcome: Progressing   Problem: Coping: Goal:  Level of anxiety will decrease Outcome: Progressing   Problem: Elimination: Goal: Will not experience complications related to bowel motility Outcome: Progressing Goal: Will not experience complications related to urinary retention Outcome: Progressing   Problem: Pain Managment: Goal: General experience of comfort will improve Outcome: Progressing   Problem: Safety: Goal: Ability to remain free from injury will improve Outcome: Progressing   Problem: Skin Integrity: Goal: Risk for impaired skin integrity will decrease Outcome: Progressing

## 2023-05-09 NOTE — Progress Notes (Signed)
Patient ID: [name], female   DOB: Jan 25, 1981, 42 y.o.   MRN: [id number] [facility] Surgery Progress Note  5 Days Post-Op  Subjective: CC-  States that she had a bad day yesterday. No longer wishes to be palliative and desires to continue full scope of care. Continues to complain of abdominal pain. Pain is central and deep in her abdomen. No n/v. Ate some sausage for breakfast. Ostomy with 1.8L out last 24hr. WBC up 17, afebrile.  Objective: Vital signs in last 24 hours: Temp:  [98 F (36.7 C)-99.1 F (37.3 C)] 98.4 F (36.9 C) (09/26 0721) Pulse Rate:  [79-92] 88 (09/26 0305) Resp:  [9-18] 14 (09/26 0721) BP: (84-119)/(54-74) 97/63 (09/26 0721) SpO2:  [91 %-100 %] 97 % (09/26 0721) Weight:  [50.9 kg] 50.9 kg (09/26 0305) Last BM Date : 05/07/23  Intake/Output from previous day: 09/25 0701 - 09/26 0700 In: 0  Out: 1900 [Stool:1800] Intake/Output this shift: No intake/output data recorded.  PE: Gen:  Alert, NAD Abd: soft, nondistended, appropriately tender, open midline pale pink with no drainage, ostomy difficult to visualize today/ air and stool in bag  Lab Results:  Recent Labs    05/08/23 0846 05/09/23 0217  WBC 14.0* 17.1*  HGB 7.4* 8.1*  HCT 23.3* 26.1*  PLT 297 345   BMET Recent Labs    05/08/23 0846  NA 130*  K 3.5  CL 90*  CO2 23  GLUCOSE 87  BUN 38*  CREATININE 4.76*  CALCIUM 7.9*   PT/INR No results for input(s): "LABPROT", "INR" in the last 72 hours. CMP     Component Value Date/Time   NA 130 (L) 05/08/2023 0846   NA 133 (L) 05/18/2020 0927   K 3.5 05/08/2023 0846   CL 90 (L) 05/08/2023 0846   CO2 23 05/08/2023 0846   GLUCOSE 87 05/08/2023 0846   BUN 38 (H) 05/08/2023 0846   BUN 45 (H) 05/18/2020 0927   CREATININE 4.76 (H) 05/08/2023 0846   CALCIUM 7.9 (L) 05/08/2023 0846   CALCIUM 12.1 (H) 11/13/2021 2026   PROT 6.2 (L) 05/01/2023 1020   PROT 7.6 05/18/2020 0927   ALBUMIN 1.6 (L) 05/08/2023 0846   ALBUMIN 3.9 05/18/2020  0927   AST 26 05/01/2023 1020   ALT 18 05/01/2023 1020   ALKPHOS 72 05/01/2023 1020   BILITOT 0.9 05/01/2023 1020   BILITOT <0.2 05/18/2020 0927   GFRNONAA 11 (L) 05/08/2023 0846   GFRAA 6 (L) 05/18/2020 0927   Lipase     Component Value Date/Time   LIPASE 40 12/20/2022 0918       Studies/Results: No results found.  Anti-infectives: Anti-infectives (From admission, onward)    Start     Dose/Rate Route Frequency Ordered Stop   05/08/23 2200  cefTRIAXone (ROCEPHIN) 2 g in sodium chloride 0.9 % 100 mL IVPB        2 g 200 mL/hr over 30 Minutes Intravenous Every 24 hours 05/08/23 1613     05/08/23 2200  metroNIDAZOLE (FLAGYL) IVPB 500 mg        500 mg 100 mL/hr over 60 Minutes Intravenous Every 12 hours 05/08/23 1613     05/04/23 0200  cefTRIAXone (ROCEPHIN) 2 g in sodium chloride 0.9 % 100 mL IVPB        2 g 200 mL/hr over 30 Minutes Intravenous Every 24 hours 05/04/23 0102 05/08/23 0144   05/04/23 0200  metroNIDAZOLE (FLAGYL) IVPB 500 mg        500 mg 100  mL/hr over 60 Minutes Intravenous Every 12 hours 05/04/23 0102 05/08/23 1427   04/30/23 1800  ciprofloxacin (CIPRO) IVPB 400 mg  Status:  Discontinued        400 mg 200 mL/hr over 60 Minutes Intravenous Every 24 hours 04/30/23 1443 05/02/23 1140   04/30/23 1515  metroNIDAZOLE (FLAGYL) IVPB 500 mg  Status:  Discontinued        500 mg 100 mL/hr over 60 Minutes Intravenous Every 12 hours 04/30/23 1418 05/02/23 1140        Assessment/Plan 42 yo female presenting with abdominal pain, diarrhea, low grade fevers, dilated small bowel and colonic wall thickening on CT   PD#5 s/p diagnostic lap converted to ex-lap with extended ileo-cecectomy (mid-ileum to just past cecum), 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 Hillery Hunter - Intra-op findings were notable for adhesions in the RLQ w/ associated twisting of the mesentery resulting in SB ischemia.  - WOC RN following for new ileostomy. High volume output (1.8L), will start BID iron and fiber -  Will reorder wound vac today - With rising WBC and ongoing abdominal pain, will obtain CT scan to r/o intraabdominal complication - mobilize, continue therapies - rec SNF   FEN: reg ID: Rocephin/Flagyl 9/21 >> VTE: SCDs, SQH q 8h Foley: out Dispo: progressive care per primary    LOS: 9 days    Franne Forts, PA-C [facility] 05/09/2023, 8:30 AM Please see Amion for pager number during day hours 7:00am-4:30pm
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
## 2023-05-09 NOTE — Progress Notes (Signed)
[facility] NEPHROLOGY PROGRESS NOTE  OP HD orders. Dialysis Orders: [facility] MWF  3h  400/1.5  49kg   R AVF   -Heparin none - last OP HD 9/16, post wt 51kg, coming off 2- 4kg over the last 3 wks - mircera IV q 2 wks, last 9/13, due 9/27 - hectorol 4 mcg IV three times per week.   Assessment/ Plan: # Abdominal pain - partial SBO s/p ex-lap with extended ileo-cecectomy with end ileostomy. Per primary/Gen surgery.   #Sepsis 2/2 to ischemic Colitis - Management per primary.   # ESRD - on HD MWF.  Status post dialysis yesterday with 1.8 L UF, tolerated well.  Next HD 05/10/2023.  #Hyperkalemia. Resolved.   # HTN/Volume - no vol excess on exam. UF wth HD.   # Anemia due to ESRD - Hgb 9, continue ESA.  Monitor hemoglobin.  # CKD-MBD - CCa and phosphorus level in range. Cont IV vdra. Resume sensipar when eating.   #PAD - bilat AKA.   # GOC: Seen by palliative care team, patient would like to continue full scope of treatment at this time.  Subjective: Seen and examined at the bedside.  Advancing diet to regular today.  No nausea, vomiting, chest pain.  Next HD tomorrow.  No new event.  Objective Vital signs in last 24 hours: Vitals:   05/08/23 2200 05/08/23 2343 05/09/23 0305 05/09/23 0721  BP: (!) 94/57 (!) 97/56 110/60 97/63  Pulse:  89 88   Resp: 16 17 18 14   Temp:  99.1 F (37.3 C) 98.4 F (36.9 C) 98.4 F (36.9 C)  TempSrc:  Oral Oral Oral  SpO2:    97%  Weight:   50.9 kg    Weight change: 1.2 kg  Intake/Output Summary (Last 24 hours) at 05/09/2023 1016 Last data filed at 05/09/2023 1000 Gross per 24 hour  Intake --  Output 1980 ml  Net -1980 ml       Labs: RENAL PANEL Recent Labs  Lab 05/02/23 1041 05/03/23 1412 05/04/23 0229 05/06/23 0239 05/08/23 0846  NA 134* 130* 133* 129* 130*  K 4.2 4.6 3.7 3.7 3.5  CL 92* 89* 93* 94* 90*  CO2 27 23 24 22 23   GLUCOSE 149* 104* 130* 132* 87  BUN 32* 45* 20 51* 38*  CREATININE 4.13*  5.49* 2.90* 4.78* 4.76*  CALCIUM 8.1* 8.9 8.7* 7.7* 7.9*  PHOS  --   --   --  4.3 5.2*  ALBUMIN  --   --   --  1.8* 1.6*    Liver Function Tests: Recent Labs  Lab 05/06/23 0239 05/08/23 0846  ALBUMIN 1.8* 1.6*   No results for input(s): "LIPASE", "AMYLASE" in the last 168 hours. No results for input(s): "AMMONIA" in the last 168 hours. CBC: Recent Labs    07/25/22 0532 07/26/22 0654 05/05/23 7169 05/06/23 0239 05/07/23 0758 05/08/23 0846 05/09/23 0217  HGB 9.8*   < > 8.4* 7.4* 9.0* 7.4* 8.1*  MCV 85.3   < > 79.6* 81.9 80.5 82.9 82.6  FERRITIN 562*  --   --   --   --   --   --   TIBC 179*  --   --   --   --   --   --   IRON 56  --   --   --   --   --   --    < > = values in this interval not displayed.  Cardiac Enzymes: No results for input(s): "CKTOTAL", "CKMB", "CKMBINDEX", "TROPONINI" in the last 168 hours. CBG: Recent Labs  Lab 05/08/23 1642 05/08/23 1939 05/08/23 2340 05/09/23 0339 05/09/23 0720  GLUCAP 113* 105* 104* 102* 136*    Iron Studies: No results for input(s): "IRON", "TIBC", "TRANSFERRIN", "FERRITIN" in the last 72 hours. Studies/Results: No results found.  Medications: Infusions:  anticoagulant sodium citrate     cefTRIAXone (ROCEPHIN)  IV 2 g (05/08/23 2200)   metronidazole 500 mg (05/08/23 2159)    Scheduled Medications:  Chlorhexidine Gluconate Cloth  6 each Topical Q0600   [START ON 05/10/2023] darbepoetin (ARANESP) injection - DIALYSIS  60 mcg Subcutaneous Q Fri-1800   doxercalciferol  4 mcg Intravenous Q M,W,F-HD   feeding supplement (GLUCERNA SHAKE)  237 mL Oral TID BM   ferrous sulfate  325 mg Oral BID WC   heparin  5,000 Units Subcutaneous Q8H   insulin aspart  0-9 Units Subcutaneous TID AC & HS   insulin glargine-yfgn  5 Units Subcutaneous Daily   pantoprazole (PROTONIX) IV  40 mg Intravenous QHS   polycarbophil  625 mg Oral BID    have reviewed scheduled and prn medications.  Physical Exam: General: More alert and awake  today.  Comfortable looking.Marland Kitchen Heart:RRR, s1s2 nl Lungs:clear b/l, no crackle Abdomen: Soft, nondistended Extremities: Bilateral BKA Dialysis Access: Right AV fistula has thrill and bruit.  Azariah Latendresse Prasad Elzy Tomasello 05/09/2023,10:16 AM  LOS: 9 days

## 2023-05-09 NOTE — Progress Notes (Addendum)
PROGRESS NOTE    [name]  NGE:[id number] DOB: [birth year] DOA: 04/30/2023 PCP: [facility]    Brief Narrative:   [name] is a 42 y.o. 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  Onset day prior to admission associated with diarrhea.  In the ED, patient was febrile, tachycardic with lactic acid 1.9 followed by 2.5.  Chest x-ray with findings of atelectasis versus infiltrate.  CT abdomen/pelvis with contrast shows evidence of a small bowel obstruction with dilated ileal segment up to 4 cm, sigmoid segment consistent with colitis, diffuse gastric wall thickening, cholelithiasis with mild chronic prominence of the common bile duct.  General surgery was consulted.  TRH consulted for admission for further evaluation and management of small bowel obstruction.   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  Patient's symptoms continue to progress despite IV antibiotics and conservative measures.  C. difficile antigen positive, toxin negative.  GI PCR panel negative.  Patient underwent diagnostic laparoscopy with extended ileus cecotomy and end ileostomy on 05/04/2023 by Dr. Hillery Hunter.  Completed course of IV antibiotics with ceftriaxone and metronidazole per infectious disease on 05/08/2023. -- General Surgery following, appreciate assistance -- WBC 9.7>>15.5>>10.5>14.0>17.1 -- Regular  diet -- Continue monitor ostomy output; 1.8L last 24h -- Ferrous sulfate 325 mg p.o. twice daily, FiberCon 625 mg p.o. twice daily for high output ostomy -- General Surgery re-ordering wound VAC today and CT A/P to assess for any postoperative complications -- CBC daily  Type 1 diabetes mellitus Hemoglobin A1c 5.7, well-controlled.  Home regimen clued Semglee 12 units [place] BID and NovoLog SSI. -- Semglee 5 units [place] daily -- SSI for coverage -- CBGs qAC/HS  Peripheral vascular disease s/p bilateral BKA -- Continue to hold home statin for now  ESRD on HD MWF -- Nephrology following, appreciate assistance -- Patient signed off early from dialysis yesterday -- Further per nephrology  Essential hypertension Home regimen includes isosorbide mononitrate 30 mg p.o. daily. -- Continue to hold given hypotension earlier during admission.  Hyperlipidemia -- Hold home atorvastatin 40 mg p.o. daily  Anemia of chronic medical/renal disease -- Hgb 10.1>>11.4>>7.4>9.0>7.4 -- Transfuse for hemoglobin less than 7.0  Goals of care On 05/08/2023, patient discussed with multiple medical providers and staff that she did not want to continue with aggressive care to include hemodialysis in which she signed off early yesterday.  She wanted to focus more on comfort.  But as of today, 05/09/2023 she reports ""I want to live" and I will continue hemodialysis at this time. -- Palliative care consulted for assistance given overall long-term prognosis poor    DVT prophylaxis: heparin injection 5,000 Units Start: 04/30/23 0900    Code Status: Full Code Family Communication: No family present at bedside this morning  Disposition Plan:  Level of care: Progressive Status is: Inpatient Remains inpatient appropriate because: Awaiting general surgery signed off; plan to return back to SNF    Consultants:  General surgery Infectious disease PCCM Nephrology  Procedures:  Diagnostic laparoscopy with  extended ileocecostomy and end ileostomy, Dr Hillery Hunter; 9/21  Antimicrobials:  Metronidazole 9/17 - 9/18, 9/20 - 9/25 Ciprofloxacin 9/17 - 5/17 Ceftriaxone 9/20 - 9/25   Subjective: Patient seen examined bedside, resting calmly.  Lying in bed.  Continues to complain of abdominal pain surrounding ostomy site.  Continues with high output, 1.8 L from ostomy over the last 24 hours.  Seen by general surgery, ordering CT abdomen/pelvis to evaluate given her persistent abdominal pain, uptrending WBC count for concern of postoperative complications.  Patient yesterday wanting to focus more on comfort, signed off of dialysis early.  Today she reports that she "wants to live" and that she will comply with medical treatment including dialysis at this time.  Palliative care consulted for assistance with goals of care/medical decision making; as her overall long-term prognosis remains poor with poor quality of life. Tolerating regular diet; no nausea or vomiting.  No other specific complaints or concerns at this time.  Denies headache, no chest pain, no shortness of breath, no fever.  No acute concerns overnight per nursing staff.  Objective: Vitals:   05/08/23 2200 05/08/23 2343 05/09/23 0305 05/09/23 0721  BP: (!) 94/57 (!) 97/56 110/60 97/63  Pulse:  89 88   Resp: 16 17 18 14   Temp:  99.1 F (37.3 C) 98.4 F (36.9 C) 98.4 F (36.9 C)  TempSrc:  Oral Oral Oral  SpO2:    97%  Weight:   50.9 kg     Intake/Output Summary (Last 24 hours) at 05/09/2023 0931 Last data filed at 05/09/2023 0330 Gross per 24 hour  Intake --  Output 1900 ml  Net -1900 ml   Filed Weights   05/08/23 0500 05/08/23 0820 05/09/23 0305  Weight: 49.6 kg 50.8 kg 50.9 kg    Examination:  Physical Exam: GEN: NAD, chronically ill appearance HEENT: NCAT, PERRL, EOMI, sclera clear PULM: CTAB w/o wheezes/crackles, normal respiratory effort, on room air CV: RRR w/o M/G/R GI: abd soft, mild TTP mid abdomen, nondistended, + BS,  noted open the midline wound with dressing in place, ostomy noted with soft brown stool in bag MSK: Noted bilateral BKA PSYCH: Depressed mood, flat affect     Data Reviewed: I have personally reviewed following labs and imaging studies  CBC: Recent Labs  Lab 05/03/23 1302 05/03/23 1412 05/05/23 0623 05/06/23 0239 05/07/23 0758 05/08/23 0846 05/09/23 0217  WBC 9.3 14.3* 15.5* 14.4* 10.5 14.0* 17.1*  NEUTROABS 7.6 11.8*  --   --   --   --   --   HGB 11.4* 9.5* 8.4* 7.4* 9.0* 7.4* 8.1*  HCT 36.2 29.4* 26.5* 23.1* 28.4* 23.3* 26.1*  MCV 80.1 82.1 79.6* 81.9 80.5 82.9 82.6  PLT 161 232 190 230 267 297 345   Basic Metabolic Panel: Recent Labs  Lab 05/02/23 1041 05/03/23 1412 05/04/23 0229 05/06/23 0239 05/08/23 0846  NA 134* 130* 133* 129* 130*  K 4.2 4.6 3.7 3.7 3.5  CL 92* 89* 93* 94* 90*  CO2 27 23 24 22 23   GLUCOSE 149* 104* 130* 132* 87  BUN 32* 45* 20 51* 38*  CREATININE 4.13* 5.49* 2.90* 4.78* 4.76*  CALCIUM 8.1* 8.9 8.7* 7.7* 7.9*  PHOS  --   --   --  4.3 5.2*   GFR: Estimated Creatinine Clearance: 11.7 mL/min (A) (by C-G formula based on SCr of 4.76 mg/dL (H)). Liver Function Tests: Recent Labs  Lab 05/06/23 0239 05/08/23 0846  ALBUMIN 1.8* 1.6*   No results  for input(s): "LIPASE", "AMYLASE" in the last 168 hours. No results for input(s): "AMMONIA" in the last 168 hours. Coagulation Profile: No results for input(s): "INR", "PROTIME" in the last 168 hours. Cardiac Enzymes: No results for input(s): "CKTOTAL", "CKMB", "CKMBINDEX", "TROPONINI" in the last 168 hours. BNP (last 3 results) No results for input(s): "PROBNP" in the last 8760 hours. HbA1C: No results for input(s): "HGBA1C" in the last 72 hours. CBG: Recent Labs  Lab 05/08/23 1642 05/08/23 1939 05/08/23 2340 05/09/23 0339 05/09/23 0720  GLUCAP 113* 105* 104* 102* 136*   Lipid Profile: No results for input(s): "CHOL", "HDL", "LDLCALC", "TRIG", "CHOLHDL", "LDLDIRECT" in the last 72  hours. Thyroid Function Tests: No results for input(s): "TSH", "T4TOTAL", "FREET4", "T3FREE", "THYROIDAB" in the last 72 hours. Anemia Panel: No results for input(s): "VITAMINB12", "FOLATE", "FERRITIN", "TIBC", "IRON", "RETICCTPCT" in the last 72 hours. Sepsis Labs: Recent Labs  Lab 05/02/23 1041 05/02/23 1614 05/05/23 0623  LATICACIDVEN 2.2* 1.9 1.4    Recent Results (from the past 240 hour(s))  Blood Culture (routine x 2)     Status: None   Collection Time: 04/30/23  3:56 AM   Specimen: BLOOD LEFT ARM  Result Value Ref Range Status   Specimen Description BLOOD LEFT ARM  Final   Special Requests   Final    BOTTLES DRAWN AEROBIC AND ANAEROBIC Blood Culture results may not be optimal due to an excessive volume of blood received in culture bottles   Culture   Final    NO GROWTH 5 DAYS Performed at [facility], [address] [place], [place] [address]    Report Status 05/05/2023 FINAL  Final  Blood Culture (routine x 2)     Status: None   Collection Time: 04/30/23  3:56 AM   Specimen: BLOOD RIGHT ARM  Result Value Ref Range Status   Specimen Description BLOOD RIGHT ARM  Final   Special Requests   Final    BOTTLES DRAWN AEROBIC AND ANAEROBIC Blood Culture adequate volume   Culture   Final    NO GROWTH 5 DAYS Performed at [facility], [address], [place], [place] [address]    Report Status 05/05/2023 FINAL  Final  SARS Coronavirus 2 by RT PCR (hospital order, performed in [facility] hospital lab) *cepheid single result test* Anterior Nasal Swab     Status: None   Collection Time: 04/30/23  6:50 AM   Specimen: Anterior Nasal Swab  Result Value Ref Range Status   SARS Coronavirus 2 by RT PCR NEGATIVE NEGATIVE Final    Comment: Performed at [facility], [address] [place], [place] [address]  Gastrointestinal Panel by PCR , Stool     Status: None   Collection Time: 05/02/23 12:06 AM   Specimen: Stool  Result Value Ref Range Status    Campylobacter species NOT DETECTED NOT DETECTED Final   Plesimonas shigelloides NOT DETECTED NOT DETECTED Final   Salmonella species NOT DETECTED NOT DETECTED Final   Yersinia enterocolitica NOT DETECTED NOT DETECTED Final   Vibrio species NOT DETECTED NOT DETECTED Final   Vibrio cholerae NOT DETECTED NOT DETECTED Final   Enteroaggregative E coli (EAEC) NOT DETECTED NOT DETECTED Final   Enteropathogenic E coli (EPEC) NOT DETECTED NOT DETECTED Final   Enterotoxigenic E coli (ETEC) NOT DETECTED NOT DETECTED Final   Shiga like toxin producing E coli (STEC) NOT DETECTED NOT DETECTED Final   Shigella/Enteroinvasive E coli (EIEC) NOT DETECTED NOT DETECTED Final  Cryptosporidium NOT DETECTED NOT DETECTED Final   Cyclospora cayetanensis NOT DETECTED NOT DETECTED Final   Entamoeba histolytica NOT DETECTED NOT DETECTED Final   Giardia lamblia NOT DETECTED NOT DETECTED Final   Adenovirus F40/41 NOT DETECTED NOT DETECTED Final   Astrovirus NOT DETECTED NOT DETECTED Final   Norovirus GI/GII NOT DETECTED NOT DETECTED Final   Rotavirus A NOT DETECTED NOT DETECTED Final   Sapovirus (I, II, IV, and V) NOT DETECTED NOT DETECTED Final    Comment: Performed at [facility], [address] [place], [place] [address]  C Difficile Quick Screen w PCR reflex     Status: Abnormal   Collection Time: 05/02/23 12:06 AM   Specimen: STOOL  Result Value Ref Range Status   C Diff antigen POSITIVE (A) NEGATIVE Final   C Diff toxin NEGATIVE NEGATIVE Final   C Diff interpretation Results are indeterminate. See PCR results.  Final    Comment: Performed at [facility], [address], [place], [place] [address]  C. Diff by PCR, Reflexed     Status: None   Collection Time: 05/02/23 12:06 AM  Result Value Ref Range Status   Toxigenic C. Difficile by PCR NEGATIVE NEGATIVE Final    Comment: Patient is colonized with non toxigenic C. difficile. May not need treatment unless significant symptoms are  present. Performed at [facility], [address] [place], [place] [address]   Culture, blood (Routine X 2) w Reflex to ID Panel     Status: None   Collection Time: 05/03/23  1:02 PM   Specimen: BLOOD LEFT HAND  Result Value Ref Range Status   Specimen Description BLOOD LEFT HAND  Final   Special Requests Blood Culture adequate volume AEROBIC BOTTLE ONLY  Final   Culture   Final    NO GROWTH 5 DAYS Performed at [facility], [address] [place], [place] [address]    Report Status 05/08/2023 FINAL  Final  Culture, blood (Routine X 2) w Reflex to ID Panel     Status: None   Collection Time: 05/03/23  1:05 PM   Specimen: BLOOD LEFT HAND  Result Value Ref Range Status   Specimen Description BLOOD LEFT HAND  Final   Special Requests   Final    AEROBIC BOTTLE ONLY Blood Culture results may not be optimal due to an inadequate volume of blood received in culture bottles   Culture   Final    NO GROWTH 5 DAYS Performed at [facility], [address], [place], [place] [address]    Report Status 05/08/2023 FINAL  Final         Radiology Studies: No results found.      Scheduled Meds:  Chlorhexidine Gluconate Cloth  6 each Topical Q0600   [START ON 05/10/2023] darbepoetin (ARANESP) injection - DIALYSIS  60 mcg Subcutaneous Q Fri-1800   doxercalciferol  4 mcg Intravenous Q M,W,F-HD   feeding supplement (GLUCERNA SHAKE)  237 mL Oral TID BM   ferrous sulfate  325 mg Oral BID WC   heparin  5,000 Units Subcutaneous Q8H   insulin aspart  0-9 Units Subcutaneous Q4H   insulin glargine-yfgn  5 Units Subcutaneous Daily   pantoprazole (PROTONIX) IV  40 mg Intravenous QHS   polycarbophil  625 mg Oral BID   Continuous Infusions:  anticoagulant sodium citrate     cefTRIAXone (ROCEPHIN)  IV 2 g (05/08/23 2200)   metronidazole 500 mg (05/08/23 2159)  LOS: 9 days    Time spent: 52 minutes spent on chart review, discussion with nursing staff, consultants,  updating family and interview/physical exam; more than 50% of that time was spent in counseling and/or coordination of care.    Alvira Philips [place], DO [facility] Available via Epic secure chat 7am-7pm After these hours, please refer to coverage provider listed on [email or web address] 05/09/2023, 9:31 AM

## 2023-05-09 NOTE — Progress Notes (Signed)
CSW confirmed with [name] that pt is LTC at [facility]. Pt can return to [facility] when medically stable.

## 2023-05-09 NOTE — Consult Note (Addendum)
WOC Nurse Wound Consult Note: Surgical team following for assessment and plan of care for abd wound.  Requested to apply Vac to midline full thickness post-op wound.  15X3.5X5cm, beefy red with yellow adipose tissue interspersed throughout.  Pt was medicated for pain prior to the procedure and tolerated with mod amt discomfort.  Applied one piece black foam to cont suction.  Discussed plan of care with surgical PA; WOC team will plan to change dressing again on Mon.  Ostomy is in close proximity and I was unable to keep them separated; pouch will need to be changed each time the Vac dressing is performed.   Pt is complaining of pain to the buttocks.  Assessed and she has scattered areas of Stage 2 pressure injuries and partial thickness skin loss across bilat buttocks and sacrum, pink and dry.  Affected area is approx 4.5X7X.1cm. Foam dressing is in place to protect from further injury.  Ordered air mattress to reduce pressure and pain to the locations.   WOC Nurse ostomy consult note  Pouch changed to ileostomy; patient did not watch the process, assist, or ask questions.  She will require total assistance with pouching activities after discharge. Stoma type/location: Stoma is red and viable, 1 1/2 inches, flush with skin level. Pouch is very full since patient has been refusing staff to empty; 100cc brown liquid.  Cautioned patient this might cause the pouch to leak and she needs to allow for emptying when half full.  Applied barrier ring and one piece flexible convex pouch.  Ordered 5 sets of supplies to the room; use supplies: barrier ring [employer] [place] [phone] and flexible convex pouch [facility] # [id number]. Enrolled patient in [employer] DC program: Yes; previously Thank-you,  Cammie Mcgee MSN, RN, CWOCN, [place], CNS [phone]

## 2023-05-09 NOTE — Progress Notes (Signed)
Wound vac alarmed x3. was contacted and trouble shoot via phone. suggested the wound vac be switched out. Pt has a new wound vac in place.

## 2023-05-10 ENCOUNTER — Inpatient Hospital Stay (HOSPITAL_COMMUNITY): Payer: Medicare Other

## 2023-05-10 DIAGNOSIS — K56609 Unspecified intestinal obstruction, unspecified as to partial versus complete obstruction: Secondary | ICD-10-CM | POA: Diagnosis not present

## 2023-05-10 HISTORY — PX: IR US GUIDE VASC ACCESS LEFT: IMG2389

## 2023-05-10 HISTORY — PX: IR FLUORO GUIDE CV LINE LEFT: IMG2282

## 2023-05-10 LAB — RENAL FUNCTION PANEL
Albumin: 1.8 g/dL — ABNORMAL LOW (ref 3.5–5.0)
Albumin: 1.9 g/dL — ABNORMAL LOW (ref 3.5–5.0)
Anion gap: 16 — ABNORMAL HIGH (ref 5–15)
Anion gap: 16 — ABNORMAL HIGH (ref 5–15)
BUN: 40 mg/dL — ABNORMAL HIGH (ref 6–20)
BUN: 41 mg/dL — ABNORMAL HIGH (ref 6–20)
CO2: 22 mmol/L (ref 22–32)
CO2: 22 mmol/L (ref 22–32)
Calcium: 8.5 mg/dL — ABNORMAL LOW (ref 8.9–10.3)
Calcium: 8.5 mg/dL — ABNORMAL LOW (ref 8.9–10.3)
Chloride: 95 mmol/L — ABNORMAL LOW (ref 98–111)
Chloride: 97 mmol/L — ABNORMAL LOW (ref 98–111)
Creatinine, Ser: 6.23 mg/dL — ABNORMAL HIGH (ref 0.44–1.00)
Creatinine, Ser: 6.74 mg/dL — ABNORMAL HIGH (ref 0.44–1.00)
GFR, Estimated: 8 mL/min — ABNORMAL LOW (ref >60)
GFR, Estimated: 7 mL/min — ABNORMAL LOW (ref >60)
Glucose, Bld: 114 mg/dL — ABNORMAL HIGH (ref 70–99)
Glucose, Bld: 107 mg/dL — ABNORMAL HIGH (ref 70–99)
Phosphorus: 6.3 mg/dL — ABNORMAL HIGH (ref 2.5–4.6)
Phosphorus: 7 mg/dL — ABNORMAL HIGH (ref 2.5–4.6)
Potassium: 3.9 mmol/L (ref 3.5–5.1)
Potassium: 4.5 mmol/L (ref 3.5–5.1)
Sodium: 133 mmol/L — ABNORMAL LOW (ref 135–145)
Sodium: 135 mmol/L (ref 135–145)

## 2023-05-10 LAB — CBC
HCT: 23.1 % — ABNORMAL LOW (ref 36.0–46.0)
Hemoglobin: 7.1 g/dL — ABNORMAL LOW (ref 12.0–15.0)
MCH: 25.6 pg — ABNORMAL LOW (ref 26.0–34.0)
MCHC: 30.7 g/dL (ref 30.0–36.0)
MCV: 83.4 fL (ref 80.0–100.0)
Platelets: 434 10*3/uL — ABNORMAL HIGH (ref 150–400)
RBC: 2.77 MIL/uL — ABNORMAL LOW (ref 3.87–5.11)
RDW: 20 % — ABNORMAL HIGH (ref 11.5–15.5)
WBC: 18.2 10*3/uL — ABNORMAL HIGH (ref 4.0–10.5)
nRBC: 0 % (ref 0.0–0.2)

## 2023-05-10 LAB — GLUCOSE, CAPILLARY
Glucose-Capillary: 159 mg/dL — ABNORMAL HIGH (ref 70–99)
Glucose-Capillary: 64 mg/dL — ABNORMAL LOW (ref 70–99)
Glucose-Capillary: 65 mg/dL — ABNORMAL LOW (ref 70–99)
Glucose-Capillary: 72 mg/dL (ref 70–99)
Glucose-Capillary: 100 mg/dL — ABNORMAL HIGH (ref 70–99)

## 2023-05-10 LAB — LACTIC ACID, PLASMA: Lactic Acid, Venous: 1.5 mmol/L (ref 0.5–1.9)

## 2023-05-10 MED ORDER — LIDOCAINE HCL (PF) 1 % IJ SOLN: 5.0000 mL | INTRAMUSCULAR | Status: DC | PRN

## 2023-05-10 MED ORDER — DEXTROSE-SODIUM CHLORIDE 5-0.9 % IV SOLN: INTRAVENOUS | Status: DC

## 2023-05-10 MED ORDER — LIDOCAINE-PRILOCAINE 2.5-2.5 % EX CREA: 1.0000 | TOPICAL_CREAM | CUTANEOUS | Status: DC | PRN

## 2023-05-10 MED ORDER — HEPARIN SODIUM (PORCINE) 1000 UNIT/ML DIALYSIS: 1000.0000 [IU] | INTRAMUSCULAR | Status: DC | PRN

## 2023-05-10 MED ORDER — HEPARIN SODIUM (PORCINE) 1000 UNIT/ML IJ SOLN
INTRAMUSCULAR | Status: AC
  Filled 2023-05-10: qty 10

## 2023-05-10 MED ORDER — ALTEPLASE 2 MG IJ SOLR: 2.0000 mg | Freq: Once | INTRAMUSCULAR | Status: DC | PRN

## 2023-05-10 MED ORDER — SEVELAMER CARBONATE 0.8 G PO PACK
0.8000 g | PACK | Freq: Three times a day (TID) | ORAL | Status: DC
  Administered 2023-05-11 – 2023-05-20 (×16): 0.8 g via ORAL
  Filled 2023-05-10 (×32): qty 1

## 2023-05-10 MED ORDER — INSULIN ASPART 100 UNIT/ML IJ SOLN
0.0000 [IU] | Freq: Three times a day (TID) | INTRAMUSCULAR | Status: DC
  Administered 2023-05-12 (×2): 1 [IU] via SUBCUTANEOUS
  Administered 2023-05-13: 2 [IU] via SUBCUTANEOUS

## 2023-05-10 MED ORDER — PANTOPRAZOLE SODIUM 40 MG PO TBEC
40.0000 mg | DELAYED_RELEASE_TABLET | Freq: Every day | ORAL | Status: DC
  Administered 2023-05-10 – 2023-05-23 (×13): 40 mg via ORAL
  Filled 2023-05-10 (×15): qty 1

## 2023-05-10 MED ORDER — ANTICOAGULANT SODIUM CITRATE 4% (200MG/5ML) IV SOLN: 5.0000 mL | Status: DC | PRN

## 2023-05-10 MED ORDER — GLUCOSE 40 % PO GEL
1.0000 | ORAL | Status: AC
  Administered 2023-05-10: 31 g via ORAL
  Filled 2023-05-10: qty 1.21

## 2023-05-10 MED ORDER — LIDOCAINE-EPINEPHRINE 1 %-1:100000 IJ SOLN
INTRAMUSCULAR | Status: AC
  Filled 2023-05-10: qty 1

## 2023-05-10 MED ORDER — PENTAFLUOROPROP-TETRAFLUOROETH EX AERO: 1.0000 | INHALATION_SPRAY | CUTANEOUS | Status: DC | PRN

## 2023-05-10 NOTE — Consult Note (Incomplete)
Chief Complaint: Patient was seen in consultation today for  Chief Complaint  Patient presents with   Abdominal Pain   at the request of * No referring provider recorded for this case *  Referring Physician(s): * No referring provider recorded for this case *  Supervising Physician: {Supervising Physician:[id number]}  Patient Status: {IR Consult Patient Status:21879}  History of Present Illness: [name] is a 42 y.o. female ***  Past Medical History:  Diagnosis Date   Anemia    Anxiety    Blind right eye 2008   Diabetes mellitus without complication (HCC)    Embolic stroke (HCC)    ESRD (end stage renal disease) (HCC)    HD on M/W/F   ESRD on hemodialysis (HCC)    GBS (Guillain Barre syndrome) (HCC)     Past Surgical History:  Procedure Laterality Date   AMPUTATION Right 08/11/2021   Procedure: TRANSMETATARSAL AMPUTATION OF RIGHT FOOT;  Surgeon: Nada Libman, MD;  Location: [facility] OR;  Service: Vascular;  Laterality: Right;   AMPUTATION Right 09/17/2021   Procedure: AMPUTATION RIGHT BELOW KNEE;  Surgeon: Nada Libman, MD;  Location: [facility] OR;  Service: Vascular;  Laterality: Right;   AMPUTATION Right 10/11/2021   Procedure: RIGHT ABOVE KNEE AMPUTATION;  Surgeon: Nada Libman, MD;  Location: [facility] OR;  Service: Vascular;  Laterality: Right;   AMPUTATION Left 07/02/2022   Procedure: LEFT ABOVE KNEE AMPUTATION;  Surgeon: Chuck Hint, MD;  Location: [facility] OR;  Service: Vascular;  Laterality: Left;   AMPUTATION TOE Left    APPLICATION OF WOUND VAC  08/16/2021   Procedure: APPLICATION OF WOUND VAC;  Surgeon: Nada Libman, MD;  Location: [facility] OR;  Service: Vascular;;   AV FISTULA PLACEMENT     BUBBLE STUDY  10/02/2021   Procedure: BUBBLE STUDY;  Surgeon: Pricilla Riffle, MD;  Location: [facility] ENDOSCOPY;  Service: Cardiovascular;;   CESAREAN SECTION  2011   COLOSTOMY Right 05/04/2023   Procedure: COLOSTOMY;  Surgeon: Moise Boring, MD;  Location: [facility] OR;  Service:  General;  Laterality: Right;   FISTULA SUPERFICIALIZATION Right 07/27/2019   Procedure: 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;  Surgeon: Chuck Hint, MD;  Location: [facility] OR;  Service: Vascular;  Laterality: Right;   INSERTION OF DIALYSIS CATHETER Left 07/27/2019   Procedure: INSERTION OF TUNNELED  DIALYSIS CATHETER;  Surgeon: Chuck Hint, MD;  Location: [facility] OR;  Service: Vascular;  Laterality: Left;   INSERTION OF DIALYSIS CATHETER Left 10/23/2021   Procedure: INSERTION OF TUNNELED PALINDROME PRECISION DIALYSIS CATHETER (23cm);  Surgeon: Cephus Shelling, MD;  Location: [facility] OR;  Service: Vascular;  Laterality: Left;   IR FLUORO GUIDE CV LINE LEFT  10/16/2021   IR FLUORO GUIDE CV LINE RIGHT  10/11/2021   IR GASTROSTOMY TUBE MOD SED  10/30/2021   IR GASTROSTOMY TUBE REMOVAL  05/08/2022   IR US GUIDE VASC ACCESS LEFT  10/16/2021   IR US GUIDE VASC ACCESS RIGHT  10/11/2021   LAPAROSCOPY N/A 05/04/2023   Procedure: LAPAROSCOPY DIAGNOSTIC;  Surgeon: Moise Boring, MD;  Location: [facility] OR;  Service: General;  Laterality: N/A;   LAPAROTOMY N/A 05/04/2023   Procedure: EXPLORATION LAPAROTOMY WITH ILEO-CAECAL RESECTION;  Surgeon: Moise Boring, MD;  Location: [facility] OR;  Service: General;  Laterality: N/A;   LEFT HEART CATH AND CORONARY ANGIOGRAPHY N/A 07/26/2022   Procedure: LEFT HEART CATH AND CORONARY ANGIOGRAPHY;  Surgeon: Swaziland, Peter M, MD;  Location: [facility] INVASIVE CV  LAB;  Service: Cardiovascular;  Laterality: N/A;   LOWER EXTREMITY ANGIOGRAPHY N/A 08/11/2021   Procedure: LOWER EXTREMITY ANGIOGRAPHY;  Surgeon: Nada Libman, MD;  Location: [facility] INVASIVE CV LAB;  Service: Cardiovascular;  Laterality: N/A;   REVISON OF ARTERIOVENOUS FISTULA Right 10/23/2021   Procedure: REVISON OF ARTERIOVENOUS FISTULA  ARM AND PLICATION;  Surgeon: Cephus Shelling, MD;  Location: [facility] OR;  Service: Vascular;  Laterality: Right;   TEE WITHOUT CARDIOVERSION N/A 10/02/2021   Procedure:  TRANSESOPHAGEAL ECHOCARDIOGRAM (TEE);  Surgeon: Pricilla Riffle, MD;  Location: [facility] ENDOSCOPY;  Service: Cardiovascular;  Laterality: N/A;   TRANSMETATARSAL AMPUTATION Right 08/16/2021   Procedure: CLOSURE OF RIGHT TRANSMETATARSAL AMPUTATION;  Surgeon: Nada Libman, MD;  Location: [facility] OR;  Service: Vascular;  Laterality: Right;    Allergies: Peanut-containing drug products, Penicillins, Shellfish allergy, and Chocolate  Medications: Prior to Admission medications   Medication Sig Start Date End Date Taking? Authorizing Provider  acetaminophen (TYLENOL) 325 MG tablet Take 650 mg by mouth every 6 (six) hours as needed for fever or mild pain.   Yes [provider]  aspirin EC 81 MG tablet Take 1 tablet (81 mg total) by mouth daily. Swallow whole. 07/27/22  Yes Vann, Jessica U, DO  atorvastatin (LIPITOR) 40 MG tablet Place 1 tablet (40 mg total) into feeding tube daily. Patient taking differently: Take 40 mg by mouth daily. 10/26/21  Yes Burnadette Pop, MD  azelastine (OPTIVAR) 0.05 % ophthalmic solution Place 1 drop into both eyes 2 (two) times daily as needed (allergies). 11/13/22  Yes [provider]  cetirizine (ZYRTEC) 10 MG tablet Take 10 mg by mouth daily.   Yes [provider]  cinacalcet (SENSIPAR) 90 MG tablet Take 90 mg by mouth daily. 09/22/19  Yes [provider]  docusate sodium (COLACE) 100 MG capsule Take 100 mg by mouth at bedtime.   Yes [provider]  dorzolamide-timolol (COSOPT) 22.3-6.8 MG/ML ophthalmic solution Place 1 drop into the left eye 2 (two) times daily. 03/30/21  Yes [provider]  insulin aspart (NOVOLOG) 100 UNIT/ML injection Inject 0-6 Units into the skin every 4 (four) hours. Patient taking differently: Inject 0-10 Units into the skin 3 (three) times daily with meals. 121-150=1 unit 152-200=2 units 201-250=3 units 251-300=5 units 301-350=7 units 351-400=10 units 10/25/21  Yes Adhikari, Amrit, MD  insulin  glargine (SEMGLEE) 100 UNIT/ML injection Inject 0.1 mLs (10 Units total) into the skin daily at 6 PM. Patient taking differently: Inject 12 Units into the skin 2 (two) times daily. 12/28/22  Yes Dahal, Melina Schools, MD  isosorbide mononitrate (IMDUR) 30 MG 24 hr tablet Take 1 tablet (30 mg total) by mouth daily. 07/27/22  Yes Vann, Jessica U, DO  melatonin 3 MG TABS tablet Take 3 mg by mouth at bedtime.   Yes [provider]  metoprolol tartrate (LOPRESSOR) 25 MG tablet Take 0.5 tablets (12.5 mg total) by mouth 2 (two) times daily. 07/27/22  Yes Vann, Jessica U, DO  midodrine (PROAMATINE) 5 MG tablet Take 5 mg by mouth See admin instructions. Take 5mg  (1 tablet) by mouth on Monday, Wednesdays, and Fridays. In addition, take 5mg  (1 tablet) every 8 hours as needed for hypotension.   Yes [provider]  Multiple Vitamin (MULTIVITAMIN WITH MINERALS) TABS tablet Take 1 tablet by mouth daily.   Yes [provider]  Nutritional Supplements (NUTRITIONAL DRINK) LIQD Take 120 mLs by mouth daily.   Yes [provider]  ondansetron (ZOFRAN) 4 MG tablet Take  4 mg by mouth every 6 (six) hours as needed for nausea/vomiting. 03/02/22  Yes [provider]  oxyCODONE-acetaminophen (PERCOCET) 5-325 MG tablet Take 1 tablet by mouth every 8 (eight) hours as needed for severe pain. Patient taking differently: Take 1 tablet by mouth every 6 (six) hours as needed for severe pain. 07/27/22  Yes Vann, Jessica U, DO  polyethylene glycol (MIRALAX / GLYCOLAX) 17 g packet Take 17 g by mouth daily at 6 PM.   Yes [provider]  thiamine 100 MG tablet Place 1 tablet (100 mg total) into feeding tube daily. Patient taking differently: Take 100 mg by mouth daily. 10/26/21  Yes Burnadette Pop, MD  Accu-Chek FastClix Lancets MISC Use as instructed to check blood sugar up to TID. E11.22 Patient taking differently: 1 each by Other route See admin instructions. Use as instructed to check blood  sugar up to TID. E11.22 04/15/19   Hoy Register, MD  Blood Glucose Monitoring Suppl (ACCU-CHEK GUIDE ME) w/Device KIT 1 kit by Does not apply route 3 (three) times daily. Use to check BG at home up to 3 times daily. E11.22 04/15/19   Hoy Register, MD  Continuous Blood Gluc Transmit (DEXCOM G6 TRANSMITTER) MISC 1 Device by Does not apply route as directed. 09/12/20   Shamleffer, Konrad Dolores, MD  glucose blood (ACCU-CHEK GUIDE) test strip Use as instructed to check blood sugar up to TID. E11.22 Patient taking differently: 1 each by Other route See admin instructions. Use as instructed to check blood sugar up to TID. E11.22 07/17/19   Fulp, Cammie, MD  oxyCODONE (OXY IR/ROXICODONE) 5 MG immediate release tablet Take 5 mg by mouth daily as needed for severe pain. Patient not taking: Reported on 04/30/2023    [provider]     Family History  Problem Relation Age of Onset   Diabetes Mother    Diabetes Father     Social History   Socioeconomic History   Marital status: Single    Spouse name: Not on file   Number of children: Not on file   Years of education: Not on file   Highest education level: Not on file  Occupational History   Not on file  Tobacco Use   Smoking status: Never   Smokeless tobacco: Never  Vaping Use   Vaping status: Never Used  Substance and Sexual Activity   Alcohol use: No   Drug use: No   Sexual activity: Not on file  Other Topics Concern   Not on file  Social History Narrative   Not on file   Social Determinants of Health   Financial Resource Strain: Not on file  Food Insecurity: No Food Insecurity (04/30/2023)   Hunger Vital Sign    Worried About Running Out of Food in the Last Year: Never true    Ran Out of Food in the Last Year: Never true  Transportation Needs: No Transportation Needs (04/30/2023)   PRAPARE - Administrator, Civil Service (Medical): No    Lack of Transportation (Non-Medical): No  Physical Activity: Not on  file  Stress: Not on file  Social Connections: Not on file    ECOG Status: {CHL ONC ECOG ZO:[id number]}  Review of Systems: A 12 point ROS discussed and pertinent positives are indicated in the HPI above.  All other systems are negative.  Review of Systems  Vital Signs: BP 93/62   Pulse 80   Temp 98.2 F (36.8 C)   Resp 16  Wt 130 lb 1.1 oz (59 kg)   SpO2 99%   BMI 24.58 kg/m     Physical Exam  Imaging: CT ABDOMEN PELVIS W CONTRAST  Result Date: 05/09/2023 CLINICAL DATA:  Postop abdominal pain EXAM: CT ABDOMEN AND PELVIS WITH CONTRAST TECHNIQUE: Multidetector CT imaging of the abdomen and pelvis was performed using the standard protocol following bolus administration of intravenous contrast. RADIATION DOSE REDUCTION: This exam was performed according to the departmental dose-optimization program which includes automated exposure control, adjustment of the mA and/or kV according to patient size and/or use of iterative reconstruction technique. CONTRAST:  75mL OMNIPAQUE IOHEXOL 350 MG/ML SOLN COMPARISON:  CT 05/01/2023, 04/30/2023, 12/22/2022 FINDINGS: Lower chest: Lung bases demonstrate no acute airspace disease. Atelectasis at the left lung base. Hepatobiliary: Contracted gallbladder with small stones. No biliary dilatation Pancreas: No acute inflammatory changes.  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 Adrenals/Urinary Tract: Adrenal glands are normal. Atrophic hypoenhancing kidneys without hydronephrosis. The bladder is unremarkable. Stomach/Bowel: The stomach contains mild debris. Interval ileocecectomy with right abdominal ileostomy. Residual small bowel thickening in the pelvis, series 3, image 65. Decompressed appearance of colon but with suspected diffuse colon wall  thickening/potential colitis. No frank intramural air. Vascular/Lymphatic: Extensive aortic vascular disease and branch vessel calcification. No aneurysm. No suspicious lymph nodes. Reproductive: Heterogenous uterine enhancement which appears hypoenhancing on the right side and at the lower uterine segment and cervix. Slightly thick-walled appearance of vaginal cuff with small fluid and focus of air. No suspicious adnexal mass Other: Negative for pelvic effusion. Small gas collections at the right gutter likely postoperative. Ventral wound. Soft tissue thickening at the umbilicus. Small gas and fluid collection within the anterior abdomen extending inferiorly to the upper pelvis, but without strong rim enhancement to suggest organized abscess. Other small foci of intraperitoneal gas likely postoperative. Musculoskeletal: No acute osseous abnormality. IMPRESSION: 1. Interval ileocecectomy with right abdominal ileostomy. Residual small bowel thickening in the pelvis, suggesting residual enteritis of infectious, inflammatory or ischemic etiology. Decompressed appearance of colon but with suspected diffuse colon wall thickening/potential colitis. 2. Small gas and fluid collection within the anterior abdomen extending inferiorly to the upper pelvis, but without strong rim enhancement to suggest organized abscess. This could be postoperative in nature but attention on follow-up imaging. Small amounts of gas in the right colic gutter and peritoneal cavity also suspected to be secondary to postoperative gas. 3. Heterogenous uterine enhancement which appears hypoenhancing on the right side and at the lower uterine segment and cervix. Slightly thick-walled appearance of vaginal cuff with small fluid and focus of air. Correlate with pelvic exam. 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 5. Gallstones 6. Atrophic kidneys without hydronephrosis. 7. Aortic atherosclerosis. Aortic Atherosclerosis (ICD10-I70.0). Electronically Signed    By: Jasmine Pang M.D.   On: 05/09/2023 17:55   PERIPHERAL VASCULAR CATHETERIZATION  Result Date: 05/04/2023 See surgical note for result.  DG Abd 1 View  Result Date: 05/03/2023 CLINICAL DATA:  Abdomen pain EXAM: ABDOMEN - 1 VIEW COMPARISON:  CT 05/01/2023, radiograph 05/01/2023, CT 04/30/2023 FINDINGS: Slight increased small bowel distension compared to prior. Small bowel loops dilated up to 5.1 cm. Paucity of distal gas IMPRESSION: Slight increased small bowel distension compared to prior and consistent with bowel obstruction. Electronically Signed   By: Jasmine Pang M.D.   On: 05/03/2023 23:12   CT Angio Abd/Pel w/  and/or w/o  Result Date: 05/02/2023 CLINICAL DATA:  42 year old female with abdominal pain, diarrhea, low-grade fever, abnormal bowel on CT Abdomen and Pelvis 2 days ago. History of End stage renal disease, on dialysis, mesenteric artery stenosis. EXAM: CTA ABDOMEN AND PELVIS WITHOUT AND WITH CONTRAST TECHNIQUE: Multidetector CT imaging of the abdomen and pelvis was performed using the standard protocol during bolus administration of intravenous contrast. Multiplanar reconstructed images and MIPs were obtained and reviewed to evaluate the vascular anatomy. RADIATION DOSE REDUCTION: This exam was performed according to the departmental dose-optimization program which includes automated exposure control, adjustment of the mA and/or kV according to patient size and/or use of iterative reconstruction technique. CONTRAST:  75mL OMNIPAQUE IOHEXOL 350 MG/ML SOLN COMPARISON:  CT Abdomen and Pelvis 04/30/2023. CTA abdomen and pelvis 12/22/2022. FINDINGS: VASCULAR Compared to CTA 12/22/2022. Extensive Aortoiliac calcified atherosclerosis. Extensive visceral calcified atherosclerosis. Celiac artery and SMA trunk remain patent. SMA atherosclerosis. Heavily calcified mesenteric vessels and multifocal mesenteric arterial stenosis such as on series 10, images 87, 84, and distally with a beaded  appearance of the vessels that does not appear significantly changed since May. No proximal mesenteric arterial occlusion is identified. Similar renal artery and IMA atherosclerosis. Similar diffuse iliofemoral calcified atherosclerosis. No large artery occlusion is identified. Negative for abdominal aortic aneurysm or dissection. On the delayed imaging phase the portal venous system appears to be patent. Review of the MIP images confirms the above findings. NON-VASCULAR Lower chest: No pericardial effusion. Mild cardiomegaly. Combined curvilinear and dependent lung base atelectasis. No convincing pleural effusion. Hepatobiliary: Vicarious contrast excretion to the gallbladder superimposed on gallstones seen yesterday. Negative liver. Pancreas: Negative. Spleen: Negative. Adrenals/Urinary Tract: Partial renal atrophy redemonstrated. Negative adrenal glands. Diminutive and unremarkable bladder. Stomach/Bowel: Fluid-filled stomach and proximal duodenum. Distal duodenum is tapered, decompressed. Proximal small bowel loops are decompressed in the left abdomen. Dilated and fluid-filled small bowel persists in the mid and lower abdomen, not significantly changed from the CT yesterday. Mild rectal retained stool. Decompressed and thick walled appearing sigmoid colon. Descending colon is mostly decompressed with mild fluid. Decompressed and indistinct transverse colon. Mostly decompressed right colon. Terminal ileum also appears to remain decompressed. Appendix caudal to the cecum on series 14, image 68 is at the upper limits of normal but there is no convincing regional inflammation. Small bowel transition seems to occur in the distal ileum as yesterday. Abnormal appearance of the small bowel mesentery redemonstrated on coronal image 91, although correlating to the same area yesterday the air and fluid there seem to be intraluminal. The bowel wall detail is more limited on the arterial phase images today, but on the portal  venous phase images this again appears to be intraluminal on series 7, image 55. No definite pneumoperitoneum. No portal venous gas identified. Lymphatic: No lymphadenopathy identified. Reproductive: Stable, within normal limits. Other: No pelvis free fluid. 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 No acute osseous abnormality identified. Similar mild generalized body wall edema. IMPRESSION: VASCULAR 1. Diffuse severe calcified atherosclerosis. Extensive chronic mesenteric arterial calcified plaque and multifocal mesenteric artery stenoses. But no proximal mesenteric or other large artery occlusion is identified. Aortic Atherosclerosis (ICD10-I70.0). 2. No portal venous thrombosis. NON-VASCULAR 1. Distal small bowel obstruction not significantly changed from yesterday. No perforation or abscess identified. The large bowel remains decompressed and also appears intermittently inflamed, especially the sigmoid colon. 2. Other chronic findings including partial renal atrophy, cholelithiasis, renal osteodystrophy. Electronically Signed   By: Althea Grimmer.D.  On: 05/02/2023 07:47   DG Abd 1 View  Result Date: 05/01/2023 CLINICAL DATA:  Follow-up small bowel obstruction. EXAM: ABDOMEN - 1 VIEW COMPARISON:  Abdomen and pelvis CT dated 04/30/2023. FINDINGS: Paucity of intestinal gas with no dilated bowel loops visualized. Diffuse arterial calcifications. Stable mild levoconvex lumbar scoliosis. IMPRESSION: Paucity of intestinal gas with no dilated bowel loops visualized. Electronically Signed   By: Beckie Salts M.D.   On: 05/01/2023 11:02   CT ABDOMEN PELVIS W CONTRAST  Result Date: 04/30/2023 CLINICAL DATA:  Generalized abdominal pain. EXAM: CT ABDOMEN AND PELVIS WITH CONTRAST TECHNIQUE: Multidetector CT imaging of the abdomen and pelvis was performed using the standard protocol following bolus administration of intravenous contrast. RADIATION DOSE  REDUCTION: This exam was performed according to the departmental dose-optimization program which includes automated exposure control, adjustment of the mA and/or kV according to patient size and/or use of iterative reconstruction technique. CONTRAST:  75mL OMNIPAQUE IOHEXOL 350 MG/ML SOLN COMPARISON:  CTA abdomen and pelvis 12/22/2022, CT abdomen and pelvis no contrast 02/09/2022. FINDINGS: Lower chest: There is asymmetric with subpleural atelectasis in the left lower lobe possibly positional, with patient left posterior oblique. Lung bases are clear of infiltrates. The cardiac size is normal. There is three-vessel coronary artery calcification, small hiatal hernia. No pericardial effusion. Hepatobiliary: There is no hepatic mass enhancement. There are stones in the gallbladder without wall thickening. The common bile duct is prominent but no more than previously, measuring 11 mm without visible filling defect. There is only slight central intrahepatic biliary prominence. Pancreas: The gland is partially atrophic with mild prominence in the duct, but this was seen previously. There is no mass enhancement. Spleen: There is a linear hypodensity in the medial aspect of the spleen which was seen previously, preferably reflecting a chronic infarct. There is no mass enhancement. Adrenals/Urinary Tract: Both kidneys show moderate atrophy. 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 There are extensive renovascular calcifications. No collecting system stones or hydronephrosis are seen. There is no adrenal or renal mass enhancement. The bladder is unremarkable for the degree of distention. Stomach/Bowel: There is increased diffuse thickening of the gastric wall greater distally. Consider endoscopic follow-up. The duodenum and jejunum are unremarkable but there is dilatation of ileal small bowel in the mid to lower abdomen up to 4 cm. The exact transition to  decompressed caliber and obstructive etiology are not identified but the most distal ileal segments are decompressed. Findings are compatible with small bowel obstruction. Evaluation of the colon demonstrates an upper limit of normal caliber retrocecal appendix without inflammatory change. This is unchanged. There is increased diffuse fold thickening in the ascending and transverse colon and increasingly also in the most distal descending and sigmoid segments consistent with colitis whether inflammatory, infectious or ischemic. There is no wall pneumatosis portal venous gas. Vascular/Lymphatic: There is extensive aortoiliac and visceral branch vessel calcific atherosclerosis, including the small visceral branch arteries to the mesentery, both kidneys, spleen and liver, especially unusual considering young age. There is no AAA. No enlarged lymph nodes are seen. Reproductive: There are extensive vascular calcifications in the uterus. The uterus and adnexal spaces are otherwise unremarkable. Other: No abdominal wall hernia or abnormality. Minimal pelvic ascites. No drainable pocket. No free air or free hemorrhage. No localizing collections. Musculoskeletal: No acute or significant osseous findings. IMPRESSION: 1. Evidence of small-bowel obstruction with dilated ileal segments up to 4 cm. The exact transition to decompressed caliber in  the obstructive etiology not identified but the most distal ileal segments are decompressed. 2. Increased diffuse fold thickening in the ascending and transverse colon and in the most distal descending and sigmoid segments consistent with colitis whether inflammatory, infectious or ischemic. No pneumatosis or portal venous gas. 3. Increased diffuse gastric wall thickening greater distally. Consider endoscopic follow-up. 4. Cholelithiasis with mild chronic prominence of the common bile duct but no visible filling defect. 5. Extensive vascular calcifications including the small visceral  branch arteries to the mesentery, both kidneys, spleen and liver, markedly age-advanced. 6. Chronic linear hypodensity in the medial aspect of the spleen, probably a chronic infarct. 7. Small hiatal hernia. 8. 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 9. Minimal pelvic ascites. 10. Aortic and coronary artery atherosclerosis. Aortic Atherosclerosis (ICD10-I70.0). Electronically Signed   By: Almira Bar M.D.   On: 04/30/2023 07:11   DG Chest Port 1 View  Result Date: 04/30/2023 CLINICAL DATA:  Body aches and questionable sepsis EXAM: PORTABLE CHEST 1 VIEW COMPARISON:  12/22/2022 FINDINGS: Low volume chest with streaky density at the left base. There is no edema, air bronchogram, effusion, or pneumothorax. Stable heart size and mediastinal contours accounting for leftward rotation. IMPRESSION: Atelectasis or infiltrate at the left lung base.  Low lung volumes. Electronically Signed   By: Tiburcio Pea M.D.   On: 04/30/2023 04:49    Labs:  CBC: Recent Labs    05/07/23 0758 05/08/23 0846 05/09/23 0217 05/10/23 0824  WBC 10.5 14.0* 17.1* 18.2*  HGB 9.0* 7.4* 8.1* 7.1*  HCT 28.4* 23.3* 26.1* 23.1*  PLT 267 297 345 434*    COAGS: Recent Labs    06/28/22 1330 04/30/23 0447  INR 1.3* 1.3*  APTT 43* 32    BMP: Recent Labs    05/04/23 0229 05/06/23 0239 05/08/23 0846 05/10/23 0824  NA 133* 129* 130* 133*  K 3.7 3.7 3.5 3.9  CL 93* 94* 90* 95*  CO2 24 22 23 22   GLUCOSE 130* 132* 87 114*  BUN 20 51* 38* 40*  CALCIUM 8.7* 7.7* 7.9* 8.5*  CREATININE 2.90* 4.78* 4.76* 6.23*  GFRNONAA 20* 11* 11* 8*    LIVER FUNCTION TESTS: Recent Labs    07/25/22 0532 07/27/22 1640 12/20/22 0918 12/26/22 0900 04/30/23 0447 05/01/23 1020 05/06/23 0239 05/08/23 0846 05/10/23 0824  BILITOT 0.4  --  0.5  --  0.7 0.9  --   --   --   AST 114*  --  19  --  21 26  --   --   --   ALT 15  --  20  --  16 18  --   --    --   ALKPHOS 61  --  138*  --  92 72  --   --   --   PROT 6.0*  --  8.0  --  6.9 6.2*  --   --   --   ALBUMIN 2.3*   < > 3.2*   < > 2.5* 2.1* 1.8* 1.6* 1.8*   < > = values in this interval not displayed.     Assessment and Plan:  42 y.o. female inpatient. 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 Patient was unable to cannulate AVF today during dialysis. Team is requesting a fistulogram possible intervention.  WBC 18.2, Patient is on subcutaneous prophylactic dose of heparin.   No pertinent allergies. Per RN patient has been NPO since midnight.   Thank you for this interesting consult.  I greatly enjoyed meeting [name] and look forward to participating in their care.  A copy of this report was sent to the requesting provider on this date.  Electronically Signed: Alene Mires, NP 05/10/2023, 12:19 PM   I spent a total of {New ZOXW:[id number]} {New Out-Pt:[id number]}  {Established Out-Pt:[id number]} in face to face in clinical consultation, greater than 50% of which was counseling/coordinating care for ***

## 2023-05-10 NOTE — Progress Notes (Addendum)
PROGRESS NOTE    [name]  ZOX:[id number] DOB: Jan 29, 1981 DOA: 04/30/2023 PCP: [facility]    Brief Narrative:   [name] is a 42 y.o. 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  Onset day prior to admission associated with diarrhea.  In the ED, patient was febrile, tachycardic with lactic acid 1.9 followed by 2.5.  Chest x-ray with findings of atelectasis versus infiltrate.  CT abdomen/pelvis with contrast shows evidence of a small bowel obstruction with dilated ileal segment up to 4 cm, sigmoid segment consistent with colitis, diffuse gastric wall thickening, cholelithiasis with mild chronic prominence of the common bile duct.  General surgery was consulted.  TRH consulted for admission for further evaluation and management of small bowel obstruction.   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  Patient's symptoms continue to progress despite IV antibiotics and conservative measures.  C. difficile antigen positive, toxin negative.  GI PCR panel negative.  Patient underwent diagnostic laparoscopy with extended ileus cecotomy and end ileostomy on 05/04/2023 by Dr. Hillery Hunter.  Pathology notable for ischemia of ileum/cecum, acute appendicitis. Repeat CT abdomen/pelvis 9/26 with residual small bowel thickening consistent with enteritis secondary to infection versus  inflammation versus ischemia, small gas/fluid anterior abdomen extending inferiorly without rim enhancement. -- General Surgery following, appreciate assistance -- WBC 9.7>>15.5>>10.5>14.0>17.1>18.2 -- Ceftriaxone 2 g IV every 24 hours -- Metronidazole 500 mg IV every 12 hours -- Regular diet -- Continue monitor ostomy output; 2L last 24h -- Ferrous sulfate 325 mg p.o. twice daily, FiberCon 625 mg p.o. twice daily for high output ostomy -- CBC daily  Type 1 diabetes mellitus Hemoglobin A1c 5.7, well-controlled.  Home regimen clued Semglee 12 units [place] BID and NovoLog SSI. -- Semglee 5 units  daily -- SSI for coverage -- CBGs qAC/HS  Peripheral vascular disease s/p bilateral BKA -- Continue to hold home statin for now  ESRD on HD MWF -- Nephrology following for continue HD while inpatient, appreciate assistance  Essential hypertension Home regimen includes isosorbide mononitrate 30 mg p.o. daily. -- Continue to hold given hypotension earlier during admission.  Hyperlipidemia -- Hold home atorvastatin 40 mg p.o. daily  Anemia of chronic medical/renal disease -- Hgb 10.1>>11.4>>7.4>9.0>7.4>7.1 -- Transfuse for hemoglobin less than 7.0  Goals of care On 05/08/2023, patient discussed with multiple medical providers and staff that she did not want to continue with aggressive care to include hemodialysis in which she signed off early yesterday.  She wanted to focus more on comfort.  But as of 05/09/2023 she reports ""I want to live" and I will continue hemodialysis at this time.  Seen by palliative care with recommendations of full code, full scope of care at this time.    DVT prophylaxis: heparin injection 5,000 Units Start: 04/30/23 0900    Code Status: Full Code Family Communication: No family present at bedside this morning  Disposition Plan:  Level of care: Progressive Status is: Inpatient Remains inpatient appropriate because: Awaiting general  surgery signed off; plan to  return back to SNF    Consultants:  General surgery Infectious disease PCCM Nephrology  Procedures:  Diagnostic laparoscopy with extended ileocecostomy and end ileostomy, Dr Hillery Hunter; 9/21  Antimicrobials:  Metronidazole 9/17 - 9/18, 9/20>> Ciprofloxacin 9/17 - 5/17 Ceftriaxone 9/20>>   Subjective: Patient seen examined bedside, lying in bed.  Returned from hemodialysis this morning after RN unable to cannulate AVF due to being clotted off.  Primary RN present at bedside.  Continues to complain of abdominal pain.  Seen by general surgery this morning, maintains on IV antibiotics with wound VAC in place.  Continues with high output ostomy with 2 L output past 24 hours.  Continues to tolerate diet without nausea/vomiting. No other specific complaints or concerns at this time.  Denies headache, no chest pain, no shortness of breath, no fever.  No acute concerns overnight per nursing staff.  Objective: Vitals:   05/09/23 2300 05/10/23 0228 05/10/23 0338 05/10/23 0756  BP: 119/66 110/73  93/62  Pulse:    80  Resp: 18 19  16   Temp: 98.3 F (36.8 C) 97.8 F (36.6 C)  98.2 F (36.8 C)  TempSrc: Oral Oral    SpO2: 96% 99%  99%  Weight:   51.7 kg 59 kg    Intake/Output Summary (Last 24 hours) at 05/10/2023 0935 Last data filed at 05/10/2023 1610 Gross per 24 hour  Intake 105.14 ml  Output 2175 ml  Net -2069.86 ml   Filed Weights   05/09/23 0305 05/10/23 0338 05/10/23 0756  Weight: 50.9 kg 51.7 kg 59 kg    Examination:  Physical Exam: GEN: NAD, chronically ill appearance HEENT: NCAT, PERRL, EOMI, sclera clear PULM: CTAB w/o wheezes/crackles, normal respiratory effort, on room air CV: RRR w/o M/G/R GI: abd soft, mild TTP mid abdomen, nondistended, + BS, wound VAC to midline abdominal wound in place, ostomy noted with soft brown stool in bag MSK: Noted bilateral BKA PSYCH: Depressed mood, flat affect     Data Reviewed: I have personally reviewed following labs and  imaging studies  CBC: Recent Labs  Lab 05/03/23 1302 05/03/23 1412 05/05/23 0623 05/06/23 0239 05/07/23 0758 05/08/23 0846 05/09/23 0217 05/10/23 0824  WBC 9.3 14.3*   < > 14.4* 10.5 14.0* 17.1* 18.2*  NEUTROABS 7.6 11.8*  --   --   --   --   --   --   HGB 11.4* 9.5*   < > 7.4* 9.0* 7.4* 8.1* 7.1*  HCT 36.2 29.4*   < > 23.1* 28.4* 23.3* 26.1* 23.1*  MCV 80.1 82.1   < > 81.9 80.5 82.9 82.6 83.4  PLT 161 232   < > 230 267 297 345 434*   < > = values in this interval not displayed.   Basic Metabolic Panel: Recent Labs  Lab 05/03/23 1412 05/04/23 0229 05/06/23 0239 05/08/23 0846 05/10/23 0824  NA 130* 133* 129* 130* 133*  K 4.6 3.7 3.7 3.5 3.9  CL 89* 93* 94* 90* 95*  CO2 23 24 22 23 22   GLUCOSE 104* 130* 132* 87 114*  BUN 45* 20 51* 38* 40*  CREATININE 5.49* 2.90* 4.78* 4.76* 6.23*  CALCIUM 8.9 8.7* 7.7* 7.9* 8.5*  PHOS  --   --  4.3 5.2* 6.3*   GFR: Estimated Creatinine Clearance: 9.8 mL/min (A) (by C-G formula based on SCr of 6.23 mg/dL (H)). Liver Function Tests: Recent Labs  Lab 05/06/23 0239 05/08/23 0846 05/10/23 0824  ALBUMIN 1.8* 1.6* 1.8*  No results for input(s): "LIPASE", "AMYLASE" in the last 168 hours. No results for input(s): "AMMONIA" in the last 168 hours. Coagulation Profile: No results for input(s): "INR", "PROTIME" in the last 168 hours. Cardiac Enzymes: No results for input(s): "CKTOTAL", "CKMB", "CKMBINDEX", "TROPONINI" in the last 168 hours. BNP (last 3 results) No results for input(s): "PROBNP" in the last 8760 hours. HbA1C: No results for input(s): "HGBA1C" in the last 72 hours. CBG: Recent Labs  Lab 05/09/23 0720 05/09/23 1046 05/09/23 1608 05/09/23 2102 05/10/23 0618  GLUCAP 136* 134* 113* 174* 159*   Lipid Profile: No results for input(s): "CHOL", "HDL", "LDLCALC", "TRIG", "CHOLHDL", "LDLDIRECT" in the last 72 hours. Thyroid Function Tests: No results for input(s): "TSH", "T4TOTAL", "FREET4", "T3FREE", "THYROIDAB" in the  last 72 hours. Anemia Panel: No results for input(s): "VITAMINB12", "FOLATE", "FERRITIN", "TIBC", "IRON", "RETICCTPCT" in the last 72 hours. Sepsis Labs: Recent Labs  Lab 05/05/23 2355  LATICACIDVEN 1.4    Recent Results (from the past 240 hour(s))  Gastrointestinal Panel by PCR , Stool     Status: None   Collection Time: 05/02/23 12:06 AM   Specimen: Stool  Result Value Ref Range Status   Campylobacter species NOT DETECTED NOT DETECTED Final   Plesimonas shigelloides NOT DETECTED NOT DETECTED Final   Salmonella species NOT DETECTED NOT DETECTED Final   Yersinia enterocolitica NOT DETECTED NOT DETECTED Final   Vibrio species NOT DETECTED NOT DETECTED Final   Vibrio cholerae NOT DETECTED NOT DETECTED Final   Enteroaggregative E coli (EAEC) NOT DETECTED NOT DETECTED Final   Enteropathogenic E coli (EPEC) NOT DETECTED NOT DETECTED Final   Enterotoxigenic E coli (ETEC) NOT DETECTED NOT DETECTED Final   Shiga like toxin producing E coli (STEC) NOT DETECTED NOT DETECTED Final   Shigella/Enteroinvasive E coli (EIEC) NOT DETECTED NOT DETECTED Final   Cryptosporidium NOT DETECTED NOT DETECTED Final   Cyclospora cayetanensis NOT DETECTED NOT DETECTED Final   Entamoeba histolytica NOT DETECTED NOT DETECTED Final   Giardia lamblia NOT DETECTED NOT DETECTED Final   Adenovirus F40/41 NOT DETECTED NOT DETECTED Final   Astrovirus NOT DETECTED NOT DETECTED Final   Norovirus GI/GII NOT DETECTED NOT DETECTED Final   Rotavirus A NOT DETECTED NOT DETECTED Final   Sapovirus (I, II, IV, and V) NOT DETECTED NOT DETECTED Final    Comment: Performed at [facility], [address] [place], [place] [address]  C Difficile Quick Screen w PCR reflex     Status: Abnormal   Collection Time: 05/02/23 12:06 AM   Specimen: STOOL  Result Value Ref Range Status   C Diff antigen POSITIVE (A) NEGATIVE Final   C Diff toxin NEGATIVE NEGATIVE Final   C Diff interpretation Results are indeterminate. See  PCR results.  Final    Comment: Performed at [facility], [address], [place], [place] [address]  C. Diff by PCR, Reflexed     Status: None   Collection Time: 05/02/23 12:06 AM  Result Value Ref Range Status   Toxigenic C. Difficile by PCR NEGATIVE NEGATIVE Final    Comment: Patient is colonized with non toxigenic C. difficile. May not need treatment unless significant symptoms are present. Performed at [facility], [address] [place], [place] [address]   Culture, blood (Routine X 2) w Reflex to ID Panel     Status: None   Collection Time: 05/03/23  1:02 PM   Specimen: BLOOD LEFT HAND  Result Value Ref Range Status  Specimen Description BLOOD LEFT HAND  Final   Special Requests Blood Culture adequate volume AEROBIC BOTTLE ONLY  Final   Culture   Final    NO GROWTH 5 DAYS Performed at [facility], [address], [place], [place] [address]    Report Status 05/08/2023 FINAL  Final  Culture, blood (Routine X 2) w Reflex to ID Panel     Status: None   Collection Time: 05/03/23  1:05 PM   Specimen: BLOOD LEFT HAND  Result Value Ref Range Status   Specimen Description BLOOD LEFT HAND  Final   Special Requests   Final    AEROBIC BOTTLE ONLY Blood Culture results may not be optimal due to an inadequate volume of blood received in culture bottles   Culture   Final    NO GROWTH 5 DAYS Performed at [facility], [address] [place], [place] [address]    Report Status 05/08/2023 FINAL  Final         Radiology Studies: CT ABDOMEN PELVIS W CONTRAST  Result Date: 05/09/2023 CLINICAL DATA:  Postop abdominal pain EXAM: CT ABDOMEN AND PELVIS WITH CONTRAST TECHNIQUE: Multidetector CT imaging of the abdomen and pelvis was performed using the standard protocol following bolus administration of intravenous contrast. RADIATION DOSE REDUCTION: This exam was performed according to the departmental dose-optimization program which includes automated exposure  control, adjustment of the mA and/or kV according to patient size and/or use of iterative reconstruction technique. CONTRAST:  75mL OMNIPAQUE IOHEXOL 350 MG/ML SOLN COMPARISON:  CT 05/01/2023, 04/30/2023, 12/22/2022 FINDINGS: Lower chest: Lung bases demonstrate no acute airspace disease. Atelectasis at the left lung base. Hepatobiliary: Contracted gallbladder with small stones. No biliary dilatation Pancreas: No acute inflammatory changes.  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 Adrenals/Urinary Tract: Adrenal glands are normal. Atrophic hypoenhancing kidneys without hydronephrosis. The bladder is unremarkable. Stomach/Bowel: The stomach contains mild debris. Interval ileocecectomy with right abdominal ileostomy. Residual small bowel thickening in the pelvis, series 3, image 65. Decompressed appearance of colon but with suspected diffuse colon wall thickening/potential colitis. No frank intramural air. Vascular/Lymphatic: Extensive aortic vascular disease and branch vessel calcification. No aneurysm. No suspicious lymph nodes. Reproductive: Heterogenous uterine enhancement which appears hypoenhancing on the right side and at the lower uterine segment and cervix. Slightly thick-walled appearance of vaginal cuff with small fluid and focus of air. No suspicious adnexal mass Other: Negative for pelvic effusion. Small gas collections at the right gutter likely postoperative. Ventral wound. Soft tissue thickening at the umbilicus. Small gas and fluid collection within the anterior abdomen extending inferiorly to the upper pelvis, but without strong rim enhancement to suggest organized abscess. Other small foci of intraperitoneal gas likely postoperative. Musculoskeletal: No acute osseous abnormality. IMPRESSION: 1.  Interval ileocecectomy with right abdominal ileostomy. Residual small bowel thickening in the pelvis, suggesting residual enteritis of infectious, inflammatory or ischemic etiology. Decompressed appearance of colon but with suspected diffuse colon wall thickening/potential colitis. 2. Small gas and fluid collection within the anterior abdomen extending inferiorly to the upper pelvis, but without strong rim enhancement to suggest organized abscess. This could be postoperative in nature but attention on follow-up imaging. Small amounts of gas in the right colic gutter and peritoneal cavity also suspected to be secondary to postoperative gas. 3. Heterogenous uterine enhancement which appears hypoenhancing on the right side  and at the lower uterine segment and cervix. Slightly thick-walled appearance of vaginal cuff with small fluid and focus of air. Correlate with pelvic exam. 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 5. Gallstones 6. Atrophic kidneys without hydronephrosis. 7. Aortic atherosclerosis. Aortic Atherosclerosis (ICD10-I70.0). Electronically Signed   By: Jasmine Pang M.D.   On: 05/09/2023 17:55        Scheduled Meds:  Chlorhexidine Gluconate Cloth  6 each Topical Q0600   Chlorhexidine Gluconate Cloth  6 each Topical Q0600   darbepoetin (ARANESP) injection - DIALYSIS  60 mcg Subcutaneous Q Fri-1800   doxercalciferol  4 mcg Intravenous Q M,W,F-HD   feeding supplement (GLUCERNA SHAKE)  237 mL Oral TID BM   ferrous sulfate  325 mg Oral BID WC   heparin  5,000 Units Subcutaneous Q8H   insulin aspart  0-9 Units Subcutaneous TID AC & HS   insulin glargine-yfgn  5 Units Subcutaneous Daily   pantoprazole (PROTONIX) IV  40 mg Intravenous QHS   polycarbophil  625 mg Oral BID   Continuous Infusions:  anticoagulant sodium citrate     cefTRIAXone (ROCEPHIN)  IV 2 g (05/09/23 2120)   metronidazole 500 mg (05/10/23 0929)     LOS: 10 days    Time spent: 53 minutes spent on chart review, discussion  with nursing staff, consultants, updating family and interview/physical exam; more than 50% of that time was spent in counseling and/or coordination of care.    Alvira Philips [place], DO [facility] Available via Epic secure chat 7am-7pm After these hours, please refer to coverage provider listed on [email or web address] 05/10/2023, 9:35 AM

## 2023-05-10 NOTE — Progress Notes (Signed)
Brief Palliative Medicine Progress Note:  PMT following peripherally for needs/decline:  Medical records reviewed including progress notes, labs, imaging. Patient continues to show clinical improvement - tolerating more POs, + output from ileostomy, improved pain. She has gone for HD this morning.   Goals are clear for full code/full scope treatment. PMT will continue to follow peripherally. If there are any imminent needs please call the service directly.   Thank you for allowing PMT to assist in the care of this patient.  Tilton Marsalis M. Katrinka Blazing [facility] Palliative Medicine Team Team Phone: [phone] NO CHARGE

## 2023-05-10 NOTE — Progress Notes (Signed)
Pt AVF cannulated with 15 g needles showing black blood and weak  flash . HD access clotted. Dr. Ronalee Belts notified. Pt sent back to her room .Report given to [facility]

## 2023-05-10 NOTE — Progress Notes (Signed)
The patient is receiving Protonix by the intravenous route.  Based on criteria approved by the Pharmacy and Therapeutics Committee and the Medical Executive Committee, the medication is being converted to the equivalent oral dose form.  These criteria include: -No active GI bleeding -Able to tolerate diet of full liquids (or better) or tube feeding -Able to tolerate other medications by the oral or enteral route  If you have any questions about this conversion, please contact the Pharmacy Department (phone [phone]).    Thank you. Noah Delaine, RPh Clinical Pharmacist 05/10/2023 2:49 PM'

## 2023-05-10 NOTE — Progress Notes (Signed)
Hypoglycemic Event  CBG: 64   Treatment: 1 tube glucose gel  Symptoms:  Lethargic and hungry  Follow-up CBG: Time:1217 CBG Result:65  Possible Reasons for Event: Inadequate meal intake  Comments/MD notified:05/10/23 at 1154  Will start D5 fluids per MD  Samuell Knoble N Trammell Bowden

## 2023-05-10 NOTE — Progress Notes (Signed)
Patient ID: [name], female   DOB: 05-18-1981, 42 y.o.   MRN: [id number] [facility] Surgery Progress Note  6 Days Post-Op  Subjective: CC-  Sleepy this morning but feeling better. States that abdominal pain is less. Tolerating diet. Ate chicken tenders for dinner and likes the vanilla protein shakes. Ostomy with 2L out last 24 hours. CT yesterday showed some residual small bowel thickening, no large abscess.  Objective: Vital signs in last 24 hours: Temp:  [97.8 F (36.6 C)-98.5 F (36.9 C)] 97.8 F (36.6 C) (09/27 0228) Resp:  [11-19] 19 (09/27 0228) BP: (104-119)/(59-78) 110/73 (09/27 0228) SpO2:  [96 %-99 %] 99 % (09/27 0228) Weight:  [51.7 kg] 51.7 kg (09/27 0338) Last BM Date : 05/09/23  Intake/Output from previous day: 09/26 0701 - 09/27 0700 In: 105.1 [IV Piggyback:105.1] Out: 2000 [Stool:2000] Intake/Output this shift: No intake/output data recorded.  PE: Gen:  Alert, NAD Abd: soft, nondistended, appropriately tender only over incision, vac to midline with good seal, ostomy difficult to visualize through stool/ air and stool in bag  Lab Results:  Recent Labs    05/08/23 0846 05/09/23 0217  WBC 14.0* 17.1*  HGB 7.4* 8.1*  HCT 23.3* 26.1*  PLT 297 345   BMET Recent Labs    05/08/23 0846  NA 130*  K 3.5  CL 90*  CO2 23  GLUCOSE 87  BUN 38*  CREATININE 4.76*  CALCIUM 7.9*   PT/INR No results for input(s): "LABPROT", "INR" in the last 72 hours. CMP     Component Value Date/Time   NA 130 (L) 05/08/2023 0846   NA 133 (L) 05/18/2020 0927   K 3.5 05/08/2023 0846   CL 90 (L) 05/08/2023 0846   CO2 23 05/08/2023 0846   GLUCOSE 87 05/08/2023 0846   BUN 38 (H) 05/08/2023 0846   BUN 45 (H) 05/18/2020 0927   CREATININE 4.76 (H) 05/08/2023 0846   CALCIUM 7.9 (L) 05/08/2023 0846   CALCIUM 12.1 (H) 11/13/2021 2026   PROT 6.2 (L) 05/01/2023 1020   PROT 7.6 05/18/2020 0927   ALBUMIN 1.6 (L) 05/08/2023 0846   ALBUMIN 3.9 05/18/2020 0927   AST 26  05/01/2023 1020   ALT 18 05/01/2023 1020   ALKPHOS 72 05/01/2023 1020   BILITOT 0.9 05/01/2023 1020   BILITOT <0.2 05/18/2020 0927   GFRNONAA 11 (L) 05/08/2023 0846   GFRAA 6 (L) 05/18/2020 0927   Lipase     Component Value Date/Time   LIPASE 40 12/20/2022 0918       Studies/Results: CT ABDOMEN PELVIS W CONTRAST  Result Date: 05/09/2023 CLINICAL DATA:  Postop abdominal pain EXAM: CT ABDOMEN AND PELVIS WITH CONTRAST TECHNIQUE: Multidetector CT imaging of the abdomen and pelvis was performed using the standard protocol following bolus administration of intravenous contrast. RADIATION DOSE REDUCTION: This exam was performed according to the departmental dose-optimization program which includes automated exposure control, adjustment of the mA and/or kV according to patient size and/or use of iterative reconstruction technique. CONTRAST:  75mL OMNIPAQUE IOHEXOL 350 MG/ML SOLN COMPARISON:  CT 05/01/2023, 04/30/2023, 12/22/2022 FINDINGS: Lower chest: Lung bases demonstrate no acute airspace disease. Atelectasis at the left lung base. Hepatobiliary: Contracted gallbladder with small stones. No biliary dilatation Pancreas: No acute inflammatory changes.  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 Adrenals/Urinary Tract: Adrenal glands are normal. Atrophic hypoenhancing kidneys without hydronephrosis.  The bladder is unremarkable. Stomach/Bowel: The stomach contains mild debris. Interval ileocecectomy with right abdominal ileostomy. Residual small bowel thickening in the pelvis, series 3, image 65. Decompressed appearance of colon but with suspected diffuse colon wall thickening/potential colitis. No frank intramural air. Vascular/Lymphatic: Extensive aortic vascular disease and branch vessel calcification.  No aneurysm. No suspicious lymph nodes. Reproductive: Heterogenous uterine enhancement which appears hypoenhancing on the right side and at the lower uterine segment and cervix. Slightly thick-walled appearance of vaginal cuff with small fluid and focus of air. No suspicious adnexal mass Other: Negative for pelvic effusion. Small gas collections at the right gutter likely postoperative. Ventral wound. Soft tissue thickening at the umbilicus. Small gas and fluid collection within the anterior abdomen extending inferiorly to the upper pelvis, but without strong rim enhancement to suggest organized abscess. Other small foci of intraperitoneal gas likely postoperative. Musculoskeletal: No acute osseous abnormality. IMPRESSION: 1. Interval ileocecectomy with right abdominal ileostomy. Residual small bowel thickening in the pelvis, suggesting residual enteritis of infectious, inflammatory or ischemic etiology. Decompressed appearance of colon but with suspected diffuse colon wall thickening/potential colitis. 2. Small gas and fluid collection within the anterior abdomen extending inferiorly to the upper pelvis, but without strong rim enhancement to suggest organized abscess. This could be postoperative in nature but attention on follow-up imaging. Small amounts of gas in the right colic gutter and peritoneal cavity also suspected to be secondary to postoperative gas. 3. Heterogenous uterine enhancement which appears hypoenhancing on the right side and at the lower uterine segment and cervix. Slightly thick-walled appearance of vaginal cuff with small fluid and focus of air. Correlate with pelvic exam. 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 5. Gallstones 6. Atrophic kidneys without hydronephrosis. 7. Aortic atherosclerosis. Aortic Atherosclerosis (ICD10-I70.0). Electronically Signed   By: Jasmine Pang M.D.   On: 05/09/2023 17:55    Anti-infectives: Anti-infectives (From admission, onward)    Start     Dose/Rate Route  Frequency Ordered Stop   05/08/23 2200  cefTRIAXone (ROCEPHIN) 2 g in sodium chloride 0.9 % 100 mL IVPB        2 g 200 mL/hr over 30 Minutes Intravenous Every 24 hours 05/08/23 1613     05/08/23 2200  metroNIDAZOLE (FLAGYL) IVPB 500 mg        500 mg 100 mL/hr over 60 Minutes Intravenous Every 12 hours 05/08/23 1613     05/04/23 0200  cefTRIAXone (ROCEPHIN) 2 g in sodium chloride 0.9 % 100 mL IVPB        2 g 200 mL/hr over 30 Minutes Intravenous Every 24 hours 05/04/23 0102 05/08/23 0144   05/04/23 0200  metroNIDAZOLE (FLAGYL) IVPB 500 mg        500 mg 100 mL/hr over 60 Minutes Intravenous Every 12 hours 05/04/23 0102 05/09/23 1203   04/30/23 1800  ciprofloxacin (CIPRO) IVPB 400 mg  Status:  Discontinued        400 mg 200 mL/hr over 60 Minutes Intravenous Every 24 hours 04/30/23 1443 05/02/23 1140   04/30/23 1515  metroNIDAZOLE (FLAGYL) IVPB 500 mg  Status:  Discontinued        500 mg 100 mL/hr over 60 Minutes Intravenous Every 12 hours 04/30/23 1418 05/02/23 1140        Assessment/Plan 42 yo female presenting with abdominal pain, diarrhea, low grade fevers, dilated small bowel and colonic wall thickening on CT   POD#6 s/p diagnostic lap converted to ex-lap with extended ileo-cecectomy (mid-ileum to just past cecum), 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 Hillery Hunter -  Intra-op findings were notable for adhesions in the RLQ w/ associated twisting of the mesentery resulting in SB ischemia. - CT 9/26 ordered due to leukocytosis but overall looks ok, some residual small bowel thickening but no large abscess. Will continue IV antibiotics. Patient clinically improving today with less abdominal pain, and she is tolerating more PO. Labs pending. - WOC RN following for new ileostomy. High volume output (2L), continue BID iron and fiber. Increased volume may be somewhat due to the oral contrast yesterday - if this remains high the next 24hr may need to add some imodium - Vac applied 9/26, plan for first change  9/30 - mobilize, continue therapies - rec SNF   FEN: reg, Glucerna ID: Rocephin/Flagyl 9/21 >> VTE: SCDs, SQH q 8h Foley: out Dispo: progressive care per primary    LOS: 10 days    Franne Forts, [facility] Surgery 05/10/2023, 7:55 AM Please see Amion for pager number during day hours 7:00am-4:30pm

## 2023-05-10 NOTE — Plan of Care (Signed)
  Problem: Clinical Measurements: Goal: Diagnostic test results will improve Outcome: Progressing Goal: Respiratory complications will improve Outcome: Progressing Goal: Cardiovascular complication will be avoided Outcome: Progressing   Problem: Coping: Goal: Level of anxiety will decrease Outcome: Progressing   Problem: Pain Managment: Goal: General experience of comfort will improve Outcome: Progressing   Problem: Safety: Goal: Ability to remain free from injury will improve Outcome: Progressing   

## 2023-05-10 NOTE — Consult Note (Cosign Needed Addendum)
Chief Complaint: Unable to cannulate AVF. Request is for fistulogram with possible intervention  Referring Physician(s): Dr. Ronalee Belts  Supervising Physician: Roanna Banning  Patient Status: [facility] - In-pt  History of Present Illness: [name] is a 42 y.o. female  inpatient. Per note from Dr. 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 Onset day prior to admission associated with diarrhea.Patient is receiving dialysis via a RUE brachiocephalic AV fistula. Team states that they are unable to cannulate the AVF. Due to time constraints IR placed a temp cath for HD access on 9.27.24. Team is requesting a fistulogram with possible intervention.  Patient alert and laying in bed somnolent. Denies any fevers, headache, chest pain, SOB, cough, abdominal pain, nausea, vomiting or bleeding. Return precautions and treatment recommendations and follow-up discussed with the patient who is agreeable with the plan.     Past Medical History:  Diagnosis Date   Anemia    Anxiety    Blind right eye 2008   Diabetes mellitus without complication (HCC)    Embolic stroke (HCC)    ESRD (end stage renal disease) (HCC)    HD on M/W/F   ESRD on hemodialysis (HCC)    GBS (Guillain Barre syndrome) (HCC)     Past Surgical History:  Procedure Laterality Date   AMPUTATION Right 08/11/2021   Procedure: TRANSMETATARSAL AMPUTATION OF RIGHT FOOT;  Surgeon: Nada Libman, MD;  Location: [facility] OR;  Service: Vascular;  Laterality: Right;   AMPUTATION Right 09/17/2021   Procedure: AMPUTATION RIGHT BELOW KNEE;  Surgeon: Nada Libman, MD;  Location: [facility] OR;  Service: Vascular;  Laterality: Right;   AMPUTATION Right  10/11/2021   Procedure: RIGHT ABOVE KNEE AMPUTATION;  Surgeon: Nada Libman, MD;  Location: [facility] OR;  Service: Vascular;  Laterality: Right;   AMPUTATION Left 07/02/2022   Procedure: LEFT ABOVE KNEE AMPUTATION;  Surgeon: Chuck Hint, MD;  Location: [facility] OR;  Service: Vascular;  Laterality: Left;   AMPUTATION TOE Left    APPLICATION OF WOUND VAC  08/16/2021   Procedure: APPLICATION OF WOUND VAC;  Surgeon: Nada Libman, MD;  Location: [facility] OR;  Service: Vascular;;   AV FISTULA PLACEMENT     BUBBLE STUDY  10/02/2021   Procedure: BUBBLE STUDY;  Surgeon: Pricilla Riffle, MD;  Location: [facility] ENDOSCOPY;  Service: Cardiovascular;;   CESAREAN SECTION  2011   COLOSTOMY Right 05/04/2023   Procedure: COLOSTOMY;  Surgeon: Moise Boring, MD;  Location: [facility] OR;  Service: General;  Laterality: Right;   FISTULA SUPERFICIALIZATION Right 07/27/2019   Procedure: 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;  Surgeon: Chuck Hint, MD;  Location: [facility] OR;  Service: Vascular;  Laterality: Right;   INSERTION OF DIALYSIS CATHETER Left 07/27/2019   Procedure: INSERTION OF TUNNELED  DIALYSIS CATHETER;  Surgeon: Chuck Hint, MD;  Location: [facility] OR;  Service: Vascular;  Laterality: Left;   INSERTION OF DIALYSIS CATHETER Left 10/23/2021   Procedure: INSERTION OF TUNNELED PALINDROME PRECISION DIALYSIS CATHETER (23cm);  Surgeon: Cephus Shelling, MD;  Location: [facility] OR;  Service: Vascular;  Laterality: Left;   IR FLUORO GUIDE CV LINE LEFT  10/16/2021   IR FLUORO GUIDE CV LINE RIGHT  10/11/2021   IR  GASTROSTOMY TUBE MOD SED  10/30/2021   IR GASTROSTOMY TUBE REMOVAL  05/08/2022   IR US GUIDE VASC ACCESS LEFT  10/16/2021   IR US GUIDE VASC ACCESS RIGHT  10/11/2021   LAPAROSCOPY N/A 05/04/2023   Procedure: LAPAROSCOPY DIAGNOSTIC;  Surgeon: Moise Boring, MD;  Location: [facility] OR;  Service: General;  Laterality: N/A;   LAPAROTOMY N/A 05/04/2023   Procedure: EXPLORATION LAPAROTOMY WITH ILEO-CAECAL  RESECTION;  Surgeon: Moise Boring, MD;  Location: [facility] OR;  Service: General;  Laterality: N/A;   LEFT HEART CATH AND CORONARY ANGIOGRAPHY N/A 07/26/2022   Procedure: LEFT HEART CATH AND CORONARY ANGIOGRAPHY;  Surgeon: Swaz[place], Peter M, MD;  [facility]: [facility] INVASIVE CV LAB;  Service: Cardiovascular;  Laterality: N/A;   LOWER EXTREMITY ANGIOGRAPHY N/A 08/11/2021   Procedure: LOWER EXTREMITY ANGIOGRAPHY;  Surgeon: Nada Libman, MD;  [facility]: [facility] INVASIVE CV LAB;  Service: Cardiovascular;  Laterality: N/A;   REVISON OF ARTERIOVENOUS FISTULA Right 10/23/2021   Procedure: REVISON OF ARTERIOVENOUS FISTULA  ARM AND PLICATION;  Surgeon: Cephus Shelling, MD;  Location: [facility] OR;  Service: Vascular;  Laterality: Right;   TEE WITHOUT CARDIOVERSION N/A 10/02/2021   Procedure: TRANSESOPHAGEAL ECHOCARDIOGRAM (TEE);  Surgeon: Pricilla Riffle, MD;  Location: [facility] ENDOSCOPY;  Service: Cardiovascular;  Laterality: N/A;   TRANSMETATARSAL AMPUTATION Right 08/16/2021   Procedure: CLOSURE OF RIGHT TRANSMETATARSAL AMPUTATION;  Surgeon: Nada Libman, MD;  Location: [facility] OR;  Service: Vascular;  Laterality: Right;    Allergies: Peanut-containing drug products, Penicillins, Shellfish allergy, and Chocolate  Medications: Prior to Admission medications   Medication Sig Start Date End Date Taking? Authorizing Provider  acetaminophen (TYLENOL) 325 MG tablet Take 650 mg by mouth every 6 (six) hours as needed for fever or mild pain.   Yes [provider]  aspirin EC 81 MG tablet Take 1 tablet (81 mg total) by mouth daily. Swallow whole. 07/27/22  Yes Vann, Jessica U, DO  atorvastatin (LIPITOR) 40 MG tablet Place 1 tablet (40 mg total) into feeding tube daily. Patient taking differently: Take 40 mg by mouth daily. 10/26/21  Yes Burnadette Pop, MD  azelastine (OPTIVAR) 0.05 % ophthalmic solution Place 1 drop into both eyes 2 (two) times daily as needed (allergies). 11/13/22  Yes [provider]  cetirizine  (ZYRTEC) 10 MG tablet Take 10 mg by mouth daily.   Yes [provider]  cinacalcet (SENSIPAR) 90 MG tablet Take 90 mg by mouth daily. 09/22/19  Yes [provider]  docusate sodium (COLACE) 100 MG capsule Take 100 mg by mouth at bedtime.   Yes [provider]  dorzolamide-timolol (COSOPT) 22.3-6.8 MG/ML ophthalmic solution Place 1 drop into the left eye 2 (two) times daily. 03/30/21  Yes [provider]  insulin aspart (NOVOLOG) 100 UNIT/ML injection Inject 0-6 Units into the skin every 4 (four) hours. Patient taking differently: Inject 0-10 Units into the skin 3 (three) times daily with meals. 121-150=1 unit 152-200=2 units 201-250=3 units 251-300=5 units 301-350=7 units 351-400=10 units 10/25/21  Yes Adhikari, Amrit, MD  insulin glargine (SEMGLEE) 100 UNIT/ML injection Inject 0.1 mLs (10 Units total) into the skin daily at 6 PM. Patient taking differently: Inject 12 Units into the skin 2 (two) times daily. 12/28/22  Yes Dahal, Melina Schools, MD  isosorbide mononitrate (IMDUR) 30 MG 24 hr tablet Take 1 tablet (30 mg total) by mouth daily. 07/27/22  Yes Vann, Jessica U, DO  melatonin 3 MG TABS tablet Take 3 mg by mouth at bedtime.  Yes [provider]  metoprolol tartrate (LOPRESSOR) 25 MG tablet Take 0.5 tablets (12.5 mg total) by mouth 2 (two) times daily. 07/27/22  Yes Vann, Jessica U, DO  midodrine (PROAMATINE) 5 MG tablet Take 5 mg by mouth See admin instructions. Take 5mg  (1 tablet) by mouth on Monday, Wednesdays, and Fridays. In addition, take 5mg  (1 tablet) every 8 hours as needed for hypotension.   Yes [provider]  Multiple Vitamin (MULTIVITAMIN WITH MINERALS) TABS tablet Take 1 tablet by mouth daily.   Yes [provider]  Nutritional Supplements (NUTRITIONAL DRINK) LIQD Take 120 mLs by mouth daily.   Yes [provider]  ondansetron (ZOFRAN) 4 MG tablet Take 4 mg by mouth every 6 (six) hours as needed for nausea/vomiting.  03/02/22  Yes [provider]  oxyCODONE-acetaminophen (PERCOCET) 5-325 MG tablet Take 1 tablet by mouth every 8 (eight) hours as needed for severe pain. Patient taking differently: Take 1 tablet by mouth every 6 (six) hours as needed for severe pain. 07/27/22  Yes Vann, Jessica U, DO  polyethylene glycol (MIRALAX / GLYCOLAX) 17 g packet Take 17 g by mouth daily at 6 PM.   Yes [provider]  thiamine 100 MG tablet Place 1 tablet (100 mg total) into feeding tube daily. Patient taking differently: Take 100 mg by mouth daily. 10/26/21  Yes Burnadette Pop, MD  Accu-Chek FastClix Lancets MISC Use as instructed to check blood sugar up to TID. E11.22 Patient taking differently: 1 each by Other route See admin instructions. Use as instructed to check blood sugar up to TID. E11.22 04/15/19   Hoy Register, MD  Blood Glucose Monitoring Suppl (ACCU-CHEK GUIDE ME) w/Device KIT 1 kit by Does not apply route 3 (three) times daily. Use to check BG at home up to 3 times daily. E11.22 04/15/19   Hoy Register, MD  Continuous Blood Gluc Transmit (DEXCOM G6 TRANSMITTER) MISC 1 Device by Does not apply route as directed. 09/12/20   Shamleffer, Konrad Dolores, MD  glucose blood (ACCU-CHEK GUIDE) test strip Use as instructed to check blood sugar up to TID. E11.22 Patient taking differently: 1 each by Other route See admin instructions. Use as instructed to check blood sugar up to TID. E11.22 07/17/19   Fulp, Cammie, MD  oxyCODONE (OXY IR/ROXICODONE) 5 MG immediate release tablet Take 5 mg by mouth daily as needed for severe pain. Patient not taking: Reported on 04/30/2023    [provider]     Family History  Problem Relation Age of Onset   Diabetes Mother    Diabetes Father     Social History   Socioeconomic History   Marital status: Single    Spouse name: Not on file   Number of children: Not on file   Years of education: Not on file   Highest education level: Not on file   Occupational History   Not on file  Tobacco Use   Smoking status: Never   Smokeless tobacco: Never  Vaping Use   Vaping status: Never Used  Substance and Sexual Activity   Alcohol use: No   Drug use: No   Sexual activity: Not on file  Other Topics Concern   Not on file  Social History Narrative   Not on file   Social Determinants of Health   Financial Resource Strain: Not on file  Food Insecurity: No Food Insecurity (04/30/2023)   Hunger Vital Sign    Worried About Running Out of Food in the Last Year:  Never true    Ran Out of Food in the Last Year: Never true  Transportation Needs: No Transportation Needs (04/30/2023)   PRAPARE - Administrator, Civil Service (Medical): No    Lack of Transportation (Non-Medical): No  Physical Activity: Not on file  Stress: Not on file  Social Connections: Not on file    Review of Systems: A 12 point ROS discussed and pertinent positives are indicated in the HPI above.  All other systems are negative.  Review of Systems  Constitutional:  Negative for fatigue and fever.  HENT:  Negative for congestion.   Respiratory:  Negative for cough and shortness of breath.   Gastrointestinal:  Negative for abdominal pain, diarrhea, nausea and vomiting.    Vital Signs: BP 117/69 (BP Location: Left Arm)   Pulse 76   Temp 97.9 F (36.6 C) (Oral)   Resp 13   Wt 130 lb 1.1 oz (59 kg)   SpO2 99%   BMI 24.58 kg/m     Physical Exam Vitals and nursing note reviewed.  HENT:     Head: Normocephalic and atraumatic.  Eyes:     Conjunctiva/sclera: Conjunctivae normal.  Cardiovascular:     Rate and Rhythm: Normal rate and regular rhythm.     Comments: RUE AVF +/+ Pulmonary:     Effort: Pulmonary effort is normal.  Musculoskeletal:        General: Normal range of motion.     Cervical back: Normal range of motion.  Skin:    General: Skin is warm and dry.  Neurological:     General: No focal deficit present.     Mental Status: She  is alert and oriented to person, place, and time.     Comments: Somnolent   Psychiatric:        Mood and Affect: Mood normal.        Behavior: Behavior normal.     Imaging: CT ABDOMEN PELVIS W CONTRAST  Result Date: 05/09/2023 CLINICAL DATA:  Postop abdominal pain EXAM: CT ABDOMEN AND PELVIS WITH CONTRAST TECHNIQUE: Multidetector CT imaging of the abdomen and pelvis was performed using the standard protocol following bolus administration of intravenous contrast. RADIATION DOSE REDUCTION: This exam was performed according to the departmental dose-optimization program which includes automated exposure control, adjustment of the mA and/or kV according to patient size and/or use of iterative reconstruction technique. CONTRAST:  75mL OMNIPAQUE IOHEXOL 350 MG/ML SOLN COMPARISON:  CT 05/01/2023, 04/30/2023, 12/22/2022 FINDINGS: Lower chest: Lung bases demonstrate no acute airspace disease. Atelectasis at the left lung base. Hepatobiliary: Contracted gallbladder with small stones. No biliary dilatation Pancreas: No acute inflammatory changes.  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 Adrenals/Urinary Tract: Adrenal glands are normal. Atrophic hypoenhancing kidneys without hydronephrosis. The bladder is unremarkable. Stomach/Bowel: The stomach contains mild debris. Interval ileocecectomy with right abdominal ileostomy. Residual small bowel thickening in the pelvis, series 3, image 65. Decompressed appearance of colon but with suspected diffuse colon wall thickening/potential colitis. No frank intramural air. Vascular/Lymphatic: Extensive aortic vascular disease and branch vessel calcification. No aneurysm. No suspicious lymph nodes. Reproductive: Heterogenous uterine enhancement which appears hypoenhancing on  the right side and at the lower uterine segment and cervix. Slightly thick-walled appearance of vaginal cuff with small fluid and focus of air. No suspicious adnexal mass Other: Negative for pelvic effusion. Small gas  collections at the right gutter likely postoperative. Ventral wound. Soft tissue thickening at the umbilicus. Small gas and fluid collection within the anterior abdomen extending inferiorly to the upper pelvis, but without strong rim enhancement to suggest organized abscess. Other small foci of intraperitoneal gas likely postoperative. Musculoskeletal: No acute osseous abnormality. IMPRESSION: 1. Interval ileocecectomy with right abdominal ileostomy. Residual small bowel thickening in the pelvis, suggesting residual enteritis of infectious, inflammatory or ischemic etiology. Decompressed appearance of colon but with suspected diffuse colon wall thickening/potential colitis. 2. Small gas and fluid collection within the anterior abdomen extending inferiorly to the upper pelvis, but without strong rim enhancement to suggest organized abscess. This could be postoperative in nature but attention on follow-up imaging. Small amounts of gas in the right colic gutter and peritoneal cavity also suspected to be secondary to postoperative gas. 3. Heterogenous uterine enhancement which appears hypoenhancing on the right side and at the lower uterine segment and cervix. Slightly thick-walled appearance of vaginal cuff with small fluid and focus of air. Correlate with pelvic exam. 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 5. Gallstones 6. Atrophic kidneys without hydronephrosis. 7. Aortic atherosclerosis. Aortic Atherosclerosis (ICD10-I70.0). Electronically Signed   By: Jasmine Pang M.D.   On: 05/09/2023 17:55   PERIPHERAL VASCULAR CATHETERIZATION  Result Date: 05/04/2023 See surgical note for result.  DG Abd 1 View  Result Date: 05/03/2023 CLINICAL DATA:  Abdomen pain EXAM: ABDOMEN - 1 VIEW COMPARISON:  CT  05/01/2023, radiograph 05/01/2023, CT 04/30/2023 FINDINGS: Slight increased small bowel distension compared to prior. Small bowel loops dilated up to 5.1 cm. Paucity of distal gas IMPRESSION: Slight increased small bowel distension compared to prior and consistent with bowel obstruction. Electronically Signed   By: Jasmine Pang M.D.   On: 05/03/2023 23:12   CT Angio Abd/Pel w/ and/or w/o  Result Date: 05/02/2023 CLINICAL DATA:  42 year old female with abdominal pain, diarrhea, low-grade fever, abnormal bowel on CT Abdomen and Pelvis 2 days ago. History of End stage renal disease, on dialysis, mesenteric artery stenosis. EXAM: CTA ABDOMEN AND PELVIS WITHOUT AND WITH CONTRAST TECHNIQUE: Multidetector CT imaging of the abdomen and pelvis was performed using the standard protocol during bolus administration of intravenous contrast. Multiplanar reconstructed images and MIPs were obtained and reviewed to evaluate the vascular anatomy. RADIATION DOSE REDUCTION: This exam was performed according to the departmental dose-optimization program which includes automated exposure control, adjustment of the mA and/or kV according to patient size and/or use of iterative reconstruction technique. CONTRAST:  75mL OMNIPAQUE IOHEXOL 350 MG/ML SOLN COMPARISON:  CT Abdomen and Pelvis 04/30/2023. CTA abdomen and pelvis 12/22/2022. FINDINGS: VASCULAR Compared to CTA 12/22/2022. Extensive Aortoiliac calcified atherosclerosis. Extensive visceral calcified atherosclerosis. Celiac artery and SMA trunk remain patent. SMA atherosclerosis. Heavily calcified mesenteric vessels and multifocal mesenteric arterial stenosis such as on series 10, images 87, 84, and distally with a beaded appearance of the vessels that does not appear significantly changed since May. No proximal mesenteric arterial occlusion is identified. Similar renal artery and IMA atherosclerosis. Similar diffuse iliofemoral calcified atherosclerosis. No large artery occlusion  is identified. Negative for abdominal aortic aneurysm or dissection. On the delayed imaging phase the portal venous system appears to be patent. Review of the MIP images confirms the above findings. NON-VASCULAR Lower chest: No pericardial effusion. Mild cardiomegaly. Combined curvilinear and dependent lung base atelectasis. No convincing pleural effusion. Hepatobiliary: Vicarious contrast excretion to the gallbladder superimposed on gallstones seen yesterday. Negative liver. Pancreas: Negative. Spleen: Negative. Adrenals/Urinary Tract: Partial renal atrophy redemonstrated. Negative  adrenal glands. Diminutive and unremarkable bladder. Stomach/Bowel: Fluid-filled stomach and proximal duodenum. Distal duodenum is tapered, decompressed. Proximal small bowel loops are decompressed in the left abdomen. Dilated and fluid-filled small bowel persists in the mid and lower abdomen, not significantly changed from the CT yesterday. Mild rectal retained stool. Decompressed and thick walled appearing sigmoid colon. Descending colon is mostly decompressed with mild fluid. Decompressed and indistinct transverse colon. Mostly decompressed right colon. Terminal ileum also appears to remain decompressed. Appendix caudal to the cecum on series 14, image 68 is at the upper limits of normal but there is no convincing regional inflammation. Small bowel transition seems to occur in the distal ileum as yesterday. Abnormal appearance of the small bowel mesentery redemonstrated on coronal image 91, although correlating to the same area yesterday the air and fluid there seem to be intraluminal. The bowel wall detail is more limited on the arterial phase images today, but on the portal venous phase images this again appears to be intraluminal on series 7, image 55. No definite pneumoperitoneum. No portal venous gas identified. Lymphatic: No lymphadenopathy identified. Reproductive: Stable, within normal limits. Other: No pelvis free fluid.  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 No acute osseous abnormality identified. Similar mild generalized body wall edema. IMPRESSION: VASCULAR 1. Diffuse severe calcified atherosclerosis. Extensive chronic mesenteric arterial calcified plaque and multifocal mesenteric artery stenoses. But no proximal mesenteric or other large artery occlusion is identified. Aortic Atherosclerosis (ICD10-I70.0). 2. No portal venous thrombosis. NON-VASCULAR 1. Distal small bowel obstruction not significantly changed from yesterday. No perforation or abscess identified. The large bowel remains decompressed and also appears intermittently inflamed, especially the sigmoid colon. 2. Other chronic findings including partial renal atrophy, cholelithiasis, renal osteodystrophy. Electronically Signed   By: Odessa Fleming M.D.   On: 05/02/2023 07:47   DG Abd 1 View  Result Date: 05/01/2023 CLINICAL DATA:  Follow-up small bowel obstruction. EXAM: ABDOMEN - 1 VIEW COMPARISON:  Abdomen and pelvis CT dated 04/30/2023. FINDINGS: Paucity of intestinal gas with no dilated bowel loops visualized. Diffuse arterial calcifications. Stable mild levoconvex lumbar scoliosis. IMPRESSION: Paucity of intestinal gas with no dilated bowel loops visualized. Electronically Signed   By: Beckie Salts M.D.   On: 05/01/2023 11:02   CT ABDOMEN PELVIS W CONTRAST  Result Date: 04/30/2023 CLINICAL DATA:  Generalized abdominal pain. EXAM: CT ABDOMEN AND PELVIS WITH CONTRAST TECHNIQUE: Multidetector CT imaging of the abdomen and pelvis was performed using the standard protocol following bolus administration of intravenous contrast. RADIATION DOSE REDUCTION: This exam was performed according to the departmental dose-optimization program which includes automated exposure control, adjustment of the mA and/or kV according to patient size and/or use of iterative reconstruction technique. CONTRAST:  75mL  OMNIPAQUE IOHEXOL 350 MG/ML SOLN COMPARISON:  CTA abdomen and pelvis 12/22/2022, CT abdomen and pelvis no contrast 02/09/2022. FINDINGS: Lower chest: There is asymmetric with subpleural atelectasis in the left lower lobe possibly positional, with patient left posterior oblique. Lung bases are clear of infiltrates. The cardiac size is normal. There is three-vessel coronary artery calcification, small hiatal hernia. No pericardial effusion. Hepatobiliary: There is no hepatic mass enhancement. There are stones in the gallbladder without wall thickening. The common bile duct is prominent but no more than previously, measuring 11 mm without visible filling defect. There is only slight central intrahepatic biliary prominence. Pancreas: The gland is partially atrophic with mild prominence in the duct, but this was seen previously. There is no mass enhancement. Spleen: There  is a linear hypodensity in the medial aspect of the spleen which was seen previously, preferably reflecting a chronic infarct. There is no mass enhancement. Adrenals/Urinary Tract: Both kidneys show moderate atrophy. 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 There are extensive renovascular calcifications. No collecting system stones or hydronephrosis are seen. There is no adrenal or renal mass enhancement. The bladder is unremarkable for the degree of distention. Stomach/Bowel: There is increased diffuse thickening of the gastric wall greater distally. Consider endoscopic follow-up. The duodenum and jejunum are unremarkable but there is dilatation of ileal small bowel in the mid to lower abdomen up to 4 cm. The exact transition to decompressed caliber and obstructive etiology are not identified but the most distal ileal segments are decompressed. Findings are compatible with small bowel obstruction. Evaluation of the colon demonstrates an upper limit of normal caliber retrocecal appendix  without inflammatory change. This is unchanged. There is increased diffuse fold thickening in the ascending and transverse colon and increasingly also in the most distal descending and sigmoid segments consistent with colitis whether inflammatory, infectious or ischemic. There is no wall pneumatosis portal venous gas. Vascular/Lymphatic: There is extensive aortoiliac and visceral branch vessel calcific atherosclerosis, including the small visceral branch arteries to the mesentery, both kidneys, spleen and liver, especially unusual considering young age. There is no AAA. No enlarged lymph nodes are seen. Reproductive: There are extensive vascular calcifications in the uterus. The uterus and adnexal spaces are otherwise unremarkable. Other: No abdominal wall hernia or abnormality. Minimal pelvic ascites. No drainable pocket. No free air or free hemorrhage. No localizing collections. Musculoskeletal: No acute or significant osseous findings. IMPRESSION: 1. Evidence of small-bowel obstruction with dilated ileal segments up to 4 cm. The exact transition to decompressed caliber in the obstructive etiology not identified but the most distal ileal segments are decompressed. 2. Increased diffuse fold thickening in the ascending and transverse colon and in the most distal descending and sigmoid segments consistent with colitis whether inflammatory, infectious or ischemic. No pneumatosis or portal venous gas. 3. Increased diffuse gastric wall thickening greater distally. Consider endoscopic follow-up. 4. Cholelithiasis with mild chronic prominence of the common bile duct but no visible filling defect. 5. Extensive vascular calcifications including the small visceral branch arteries to the mesentery, both kidneys, spleen and liver, markedly age-advanced. 6. Chronic linear hypodensity in the medial aspect of the spleen, probably a chronic infarct. 7. Small hiatal hernia. 8. 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 9. Minimal pelvic ascites. 10. Aortic and coronary artery atherosclerosis. Aortic Atherosclerosis (ICD10-I70.0). Electronically Signed   By: Almira Bar M.D.   On: 04/30/2023 07:11   DG Chest Port 1 View  Result Date: 04/30/2023 CLINICAL DATA:  Body aches and questionable sepsis EXAM: PORTABLE CHEST 1 VIEW COMPARISON:  12/22/2022 FINDINGS: Low volume chest with streaky density at the left base. There is no edema, air bronchogram, effusion, or pneumothorax. Stable heart size and mediastinal contours accounting for leftward rotation. IMPRESSION: Atelectasis or infiltrate at the left lung base.  Low lung volumes. Electronically Signed   By: Tiburcio Pea M.D.   On: 04/30/2023 04:49    Labs:  CBC: Recent Labs    05/07/23 0758 05/08/23 0846 05/09/23 0217 05/10/23 0824  WBC 10.5 14.0* 17.1* 18.2*  HGB 9.0* 7.4* 8.1* 7.1*  HCT 28.4* 23.3* 26.1* 23.1*  PLT 267 297 345 434*  COAGS: Recent Labs    06/28/22 1330 04/30/23 0447  INR 1.3* 1.3*  APTT 43* 32    BMP: Recent Labs    05/04/23 0229 05/06/23 0239 05/08/23 0846 05/10/23 0824  NA 133* 129* 130* 133*  K 3.7 3.7 3.5 3.9  CL 93* 94* 90* 95*  CO2 24 22 23 22   GLUCOSE 130* 132* 87 114*  BUN 20 51* 38* 40*  CALCIUM 8.7* 7.7* 7.9* 8.5*  CREATININE 2.90* 4.78* 4.76* 6.23*  GFRNONAA 20* 11* 11* 8*    LIVER FUNCTION TESTS: Recent Labs    07/25/22 0532 07/27/22 1640 12/20/22 0918 12/26/22 0900 04/30/23 0447 05/01/23 1020 05/06/23 0239 05/08/23 0846 05/10/23 0824  BILITOT 0.4  --  0.5  --  0.7 0.9  --   --   --   AST 114*  --  19  --  21 26  --   --   --   ALT 15  --  20  --  16 18  --   --   --   ALKPHOS 61  --  138*  --  92 72  --   --   --   PROT 6.0*  --  8.0  --  6.9 6.2*  --   --   --   ALBUMIN 2.3*   < > 3.2*   < > 2.5* 2.1* 1.8* 1.6* 1.8*   < > = values in this interval not displayed.      Assessment and Plan:  42 y.o. female  inpatient. Per note from Dr. [place] dated 9.27.24.History of  diabetes mellitus with diabetic retinopathy, PVD s/p bilateral BKA, ESRD on HD MWF, HTN, HLD, history of CVA, history of chronic anemia, GBS, mesenteric ischemia with colonic pneumatosis May 2024 treated nonoperatively who [facility] ED on 9[facility]17 from SNF with acute onset abdominal pain. Onset day prior to admission associated with diarrhea.Patient is receiving dialysis via a RUE brachiocephalic AV fistula. Team states that they are unable to cannulate the AVF. Due to time constraints IR placed a temp cath for HD access on 9.27.24. Team is requesting a fistulogram with possible intervention.  No IR intervention listed in EPIC. 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  Vascular surgery has requested a venous doppler.  WBC is 18.2, Hgb 7.1, sodium 122, BUN 40, Cr 6.23, abumin 1.8, GFR < 8.   IR consulted for possible Fistulogram with possible intervention . Case has been reviewed and procedure approved by Dr. Milford Cage.  Patient tentatively scheduled for 10.1.24.  Team instructed to: Keep Patient to be NPO after midnight on 10.1.24  IR will call patient when ready.     Risks and benefits discussed with the patient including, but not limited to bleeding, infection, vascular injury, pulmonary embolism, need for tunneled HD catheter placement or even death.  All of the patient's questions were answered, patient is agreeable to proceed.  Consent signed and in chart.   Thank you for this interesting consult.  I greatly enjoyed meeting Notnamed Croucher and look forward to participating in their care.  A copy of this report was sent to the requesting provider on this date.  Electronically Signed: Alene Mires, NP 05/10/2023, 2:53 PM   I spent a total of 40 Minutes    in face to face in clinical consultation, greater than 50% of which was counseling/coordinating care for fistulogram possible  intervention.

## 2023-05-10 NOTE — TOC Progression Note (Signed)
Transition of Care [facility]) - Progression Note    Patient Details  Name: Lilinoe Acklin MRN: [id number] Date of Birth: 03-17-1981  Transition of Care [facility]) CM/SW Contact  Leone Haven, RN Phone Number: 05/10/2023, 2:01 PM  Clinical Narrative:    NCM checked with Alphonzo Lemmings at [facility], informed her that patient will need a wound vac, so they can order it for him.        Expected Discharge Plan and Services                                               Social Determinants of Health (SDOH) Interventions SDOH Screenings   Food Insecurity: No Food Insecurity (04/30/2023)  Housing: Low Risk  (04/30/2023)  Transportation Needs: No Transportation Needs (04/30/2023)  Utilities: Not At Risk (04/30/2023)  Depression (PHQ2-9): Low Risk  (05/11/2020)  Tobacco Use: Low Risk  (04/30/2023)    Readmission Risk Interventions    07/04/2022   10:49 AM 08/23/2021    3:42 PM  Readmission Risk Prevention Plan  Transportation Screening Complete Complete  HRI or Home Care Consult  Complete  Social Work Consult for Recovery Care Planning/Counseling  Complete  Palliative Care Screening  Not Applicable  Medication Review Oceanographer) Complete Complete  PCP or Specialist appointment within 3-5 days of discharge Complete   HRI or Home Care Consult Complete   SW Recovery Care/Counseling Consult Complete   Palliative Care Screening Not Applicable   Skilled Nursing Facility Complete

## 2023-05-10 NOTE — Progress Notes (Addendum)
Inpatient Diabetes Program Recommendations  AACE/ADA: New Consensus Statement on Inpatient Glycemic Control (2015)  Target Ranges:  Prepandial:   less than 140 mg/dL      Peak postprandial:   less than 180 mg/dL (1-2 hours)      Critically ill patients:  140 - 180 mg/dL   Lab Results  Component Value Date   GLUCAP 65 (L) 05/10/2023   HGBA1C 5.7 (H) 04/30/2023    Review of Glycemic Control  Latest Reference Range & Units 05/09/23 10:46 05/09/23 16:08 05/09/23 21:02 05/10/23 06:18 05/10/23 11:44 05/10/23 12:21  Glucose-Capillary 70 - 99 mg/dL 638 (H) 756 (H) 433 (H) 159 (H) 64 (L) 65 (L)   Diabetes history: DM 1 Outpatient Diabetes medications: Dexcom sensor Novolog 1-10 units tid with meals Semglee 12 units bid Current orders for Inpatient glycemic control:  Novolog 0-9 units tid with meals  Semglee 5 units daily  Inpatient Diabetes Program Recommendations:   Note mild low. Consider reducing Novolog correction to "very sensitive" 0-6 units tid with meals.   Thanks,  Beryl Meager, RN, BC-ADM Inpatient Diabetes Coordinator Pager [phone]  (8a-5p)

## 2023-05-10 NOTE — Progress Notes (Addendum)
[facility] PROGRESS NOTE  OP HD orders. Dialysis Orders: [facility] MWF  3h  400/1.5  49kg   R AVF   -Heparin none - last OP HD 9/16, post wt 51kg, coming off 2- 4kg over the last 3 wks - mircera IV q 2 wks, last 9/13, due 9/27 - hectorol 4 mcg IV three times per week.   Assessment/ Plan: # Abdominal pain - partial SBO s/p ex-lap with extended ileo-cecectomy with end ileostomy. Per primary/Gen surgery.   #Sepsis 2/2 to ischemic Colitis - Management per primary.   # ESRD - on HD MWF.  Unable to cannulate AV fistula today because of clotting.  I will consult IR for declot.  Possibly HD tomorrow after declot procedure.  #Hyperkalemia. Resolved.   # HTN/Volume - no vol excess on exam. UF wth HD.   # Anemia due to ESRD - Hgb 9, continue ESA.  Monitor hemoglobin.  # CKD-MBD -start sevelamer for hyperphosphatemia.  Cont IV vdra. Resume sensipar when eating.   #PAD - bilat AKA.   # GOC: Seen by palliative care team, patient would like to continue full scope of treatment at this time.  Addendum 1:11 PM: I spoke with Dr. Sherral Hammers from vascular surgeon, he will evaluate the fistula and decide whether the patient needs declotting.  Subjective: Seen and examined at the bedside.  Unable to do HD today because of AV fistula clotting.  Denies nausea, vomiting, chest pain or shortness of breath.  No major event. Objective Vital signs in last 24 hours: Vitals:   05/09/23 2300 05/10/23 0228 05/10/23 0338 05/10/23 0756  BP: 119/66 110/73  93/62  Pulse:    80  Resp: 18 19  16   Temp: 98.3 F (36.8 C) 97.8 F (36.6 C)  98.2 F (36.8 C)  TempSrc: Oral Oral    SpO2: 96% 99%  99%  Weight:   51.7 kg 59 kg   Weight change: 0.91 kg  Intake/Output Summary (Last 24 hours) at 05/10/2023 1054 Last data filed at 05/10/2023 1610 Gross per 24 hour  Intake 105.14 ml  Output 2075 ml  Net -1969.86 ml       Labs: RENAL PANEL Recent Labs  Lab 05/03/23 1412  05/04/23 0229 05/06/23 0239 05/08/23 0846 05/10/23 0824  NA 130* 133* 129* 130* 133*  K 4.6 3.7 3.7 3.5 3.9  CL 89* 93* 94* 90* 95*  CO2 23 24 22 23 22   GLUCOSE 104* 130* 132* 87 114*  BUN 45* 20 51* 38* 40*  CREATININE 5.49* 2.90* 4.78* 4.76* 6.23*  CALCIUM 8.9 8.7* 7.7* 7.9* 8.5*  PHOS  --   --  4.3 5.2* 6.3*  ALBUMIN  --   --  1.8* 1.6* 1.8*    Liver Function Tests: Recent Labs  Lab 05/06/23 0239 05/08/23 0846 05/10/23 0824  ALBUMIN 1.8* 1.6* 1.8*   No results for input(s): "LIPASE", "AMYLASE" in the last 168 hours. No results for input(s): "AMMONIA" in the last 168 hours. CBC: Recent Labs    07/25/22 0532 07/26/22 0654 05/06/23 0239 05/07/23 0758 05/08/23 0846 05/09/23 0217 05/10/23 0824  HGB 9.8*   < > 7.4* 9.0* 7.4* 8.1* 7.1*  MCV 85.3   < > 81.9 80.5 82.9 82.6 83.4  FERRITIN 562*  --   --   --   --   --   --   TIBC 179*  --   --   --   --   --   --  IRON 56  --   --   --   --   --   --    < > = values in this interval not displayed.    Cardiac Enzymes: No results for input(s): "CKTOTAL", "CKMB", "CKMBINDEX", "TROPONINI" in the last 168 hours. CBG: Recent Labs  Lab 05/09/23 0720 05/09/23 1046 05/09/23 1608 05/09/23 2102 05/10/23 0618  GLUCAP 136* 134* 113* 174* 159*    Iron Studies: No results for input(s): "IRON", "TIBC", "TRANSFERRIN", "FERRITIN" in the last 72 hours. Studies/Results: CT ABDOMEN PELVIS W CONTRAST  Result Date: 05/09/2023 CLINICAL DATA:  Postop abdominal pain EXAM: CT ABDOMEN AND PELVIS WITH CONTRAST TECHNIQUE: Multidetector CT imaging of the abdomen and pelvis was performed using the standard protocol following bolus administration of intravenous contrast. RADIATION DOSE REDUCTION: This exam was performed according to the departmental dose-optimization program which includes automated exposure control, adjustment of the mA and/or kV according to patient size and/or use of iterative reconstruction technique. CONTRAST:  75mL  OMNIPAQUE IOHEXOL 350 MG/ML SOLN COMPARISON:  CT 05/01/2023, 04/30/2023, 12/22/2022 FINDINGS: Lower chest: Lung bases demonstrate no acute airspace disease. Atelectasis at the left lung base. Hepatobiliary: Contracted gallbladder with small stones. No biliary dilatation Pancreas: No acute inflammatory changes.  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 Adrenals/Urinary Tract: Adrenal glands are normal. Atrophic hypoenhancing kidneys without hydronephrosis. The bladder is unremarkable. Stomach/Bowel: The stomach contains mild debris. Interval ileocecectomy with right abdominal ileostomy. Residual small bowel thickening in the pelvis, series 3, image 65. Decompressed appearance of colon but with suspected diffuse colon wall thickening/potential colitis. No frank intramural air. Vascular/Lymphatic: Extensive aortic vascular disease and branch vessel calcification. No aneurysm. No suspicious lymph nodes. Reproductive: Heterogenous uterine enhancement which appears hypoenhancing on the right side and at the lower uterine segment and cervix. Slightly thick-walled appearance of vaginal cuff with small fluid and focus of air. No suspicious adnexal mass Other: Negative for pelvic effusion. Small gas collections at the right gutter likely postoperative. Ventral wound. Soft tissue thickening at the umbilicus. Small gas and fluid collection within the anterior abdomen extending inferiorly to the upper pelvis, but without strong rim enhancement to suggest organized abscess. Other small foci of intraperitoneal gas likely postoperative. Musculoskeletal: No acute osseous abnormality. IMPRESSION: 1. Interval ileocecectomy with right abdominal ileostomy. Residual small bowel thickening in the pelvis, suggesting residual enteritis of  infectious, inflammatory or ischemic etiology. Decompressed appearance of colon but with suspected diffuse colon wall thickening/potential colitis. 2. Small gas and fluid collection within the anterior abdomen extending inferiorly to the upper pelvis, but without strong rim enhancement to suggest organized abscess. This could be postoperative in nature but attention on follow-up imaging. Small amounts of gas in the right colic gutter and peritoneal cavity also suspected to be secondary to postoperative gas. 3. Heterogenous uterine enhancement which appears hypoenhancing on the right side and at the lower uterine segment and cervix. Slightly thick-walled appearance of vaginal cuff with small fluid and focus of air. Correlate with pelvic exam. 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 5. Gallstones 6. Atrophic kidneys without hydronephrosis. 7. Aortic atherosclerosis. Aortic Atherosclerosis (ICD10-I70.0). Electronically Signed   By: Jasmine Pang M.D.   On: 05/09/2023 17:55    Medications: Infusions:  anticoagulant sodium citrate     cefTRIAXone (ROCEPHIN)  IV 2 g (05/09/23 2120)   metronidazole 500 mg (05/10/23 0929)  Scheduled Medications:  Chlorhexidine Gluconate Cloth  6 each Topical Q0600   Chlorhexidine Gluconate Cloth  6 each Topical Q0600   darbepoetin (ARANESP) injection - DIALYSIS  60 mcg Subcutaneous Q Fri-1800   doxercalciferol  4 mcg Intravenous Q M,W,F-HD   feeding supplement (GLUCERNA SHAKE)  237 mL Oral TID BM   ferrous sulfate  325 mg Oral BID WC   heparin  5,000 Units Subcutaneous Q8H   insulin aspart  0-9 Units Subcutaneous TID AC & HS   insulin glargine-yfgn  5 Units Subcutaneous Daily   pantoprazole (PROTONIX) IV  40 mg Intravenous QHS   polycarbophil  625 mg Oral BID    have reviewed scheduled and prn medications.  Physical Exam: General: More alert and awake today.  Lying comfortable. Heart:RRR, s1s2 nl Lungs:clear b/l, no crackle Abdomen: Soft,  nondistended Extremities: Bilateral BKA Dialysis Access: Right AV fistula has faint thrill  [name] 05/10/2023,10:54 AM  LOS: 10 days

## 2023-05-10 NOTE — Consult Note (Signed)
Hospital Consult    Reason for Consult: Concern for right arm fistula occlusion Requesting Physician: Nephrology MRN #:  [id number]  History of Present Illness: This is a 42 y.o. female well-known to the vascular surgery service line having undergone bilateral AKA's due to severe peripheral arterial disease.  She also has end-stage renal disease and is currently being dialyzed through a right sided fistula.  Earlier today at dialysis, there was some concern for clotting.  Both vascular surgery and interventional radiology were called for possible declot.  On exam, [name] was lying in bed comfortably.  She had trouble answering questions due to somnolence.   Past Medical History:  Diagnosis Date   Anemia    Anxiety    Blind right eye 2008   Diabetes mellitus without complication (HCC)    Embolic stroke (HCC)    ESRD (end stage renal disease) (HCC)    HD on M/W/F   ESRD on hemodialysis (HCC)    GBS (Guillain Barre syndrome) (HCC)     Past Surgical History:  Procedure Laterality Date   AMPUTATION Right 08/11/2021   Procedure: TRANSMETATARSAL AMPUTATION OF RIGHT FOOT;  Surgeon: Nada Libman, MD;  Location: [facility] OR;  Service: Vascular;  Laterality: Right;   AMPUTATION Right 09/17/2021   Procedure: AMPUTATION RIGHT BELOW KNEE;  Surgeon: Nada Libman, MD;  Location: [facility] OR;  Service: Vascular;  Laterality: Right;   AMPUTATION Right 10/11/2021   Procedure: RIGHT ABOVE KNEE AMPUTATION;  Surgeon: Nada Libman, MD;  Location: [facility] OR;  Service: Vascular;  Laterality: Right;   AMPUTATION Left 07/02/2022   Procedure: LEFT ABOVE KNEE AMPUTATION;  Surgeon: Chuck Hint, MD;  Location: [facility] OR;  Service: Vascular;  Laterality: Left;   AMPUTATION TOE Left    APPLICATION OF WOUND VAC  08/16/2021   Procedure: APPLICATION OF WOUND VAC;  Surgeon: Nada Libman, MD;  Location: [facility] OR;  Service: Vascular;;   AV FISTULA PLACEMENT     BUBBLE STUDY  10/02/2021   Procedure: BUBBLE STUDY;   Surgeon: Pricilla Riffle, MD;  Location: [facility] ENDOSCOPY;  Service: Cardiovascular;;   CESAREAN SECTION  2011   COLOSTOMY Right 05/04/2023   Procedure: COLOSTOMY;  Surgeon: Moise Boring, MD;  Location: [facility] OR;  Service: General;  Laterality: Right;   FISTULA SUPERFICIALIZATION Right 07/27/2019   Procedure: 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;  Surgeon: Chuck Hint, MD;  Location: [facility] OR;  Service: Vascular;  Laterality: Right;   INSERTION OF DIALYSIS CATHETER Left 07/27/2019   Procedure: INSERTION OF TUNNELED  DIALYSIS CATHETER;  Surgeon: Chuck Hint, MD;  Location: [facility] OR;  Service: Vascular;  Laterality: Left;   INSERTION OF DIALYSIS CATHETER Left 10/23/2021   Procedure: INSERTION OF TUNNELED PALINDROME PRECISION DIALYSIS CATHETER (23cm);  Surgeon: Cephus Shelling, MD;  Location: [facility] OR;  Service: Vascular;  Laterality: Left;   IR FLUORO GUIDE CV LINE LEFT  10/16/2021   IR FLUORO GUIDE CV LINE RIGHT  10/11/2021   IR GASTROSTOMY TUBE MOD SED  10/30/2021   IR GASTROSTOMY TUBE REMOVAL  05/08/2022   IR US GUIDE VASC ACCESS LEFT  10/16/2021   IR US GUIDE VASC ACCESS RIGHT  10/11/2021   LAPAROSCOPY N/A 05/04/2023   Procedure: LAPAROSCOPY DIAGNOSTIC;  Surgeon: Moise Boring, MD;  Location: [facility] OR;  Service: General;  Laterality: N/A;   LAPAROTOMY N/A 05/04/2023   Procedure: EXPLORATION LAPAROTOMY WITH ILEO-CAECAL RESECTION;  Surgeon: Moise Boring, MD;  Location: [facility] OR;  Service: General;  Laterality: N/A;   LEFT HEART CATH AND CORONARY ANGIOGRAPHY N/A 07/26/2022   Procedure: LEFT HEART CATH AND CORONARY ANGIOGRAPHY;  Surgeon: Swaziland, Peter M, MD;  Location: [facility] INVASIVE CV LAB;  Service: Cardiovascular;  Laterality: N/A;   LOWER EXTREMITY ANGIOGRAPHY N/A 08/11/2021   Procedure: LOWER EXTREMITY ANGIOGRAPHY;  Surgeon: Nada Libman, MD;  Location: [facility] INVASIVE CV LAB;  Service: Cardiovascular;  Laterality: N/A;   REVISON OF ARTERIOVENOUS FISTULA Right  10/23/2021   Procedure: REVISON OF ARTERIOVENOUS FISTULA  ARM AND PLICATION;  Surgeon: Cephus Shelling, MD;  Location: [facility] OR;  Service: Vascular;  Laterality: Right;   TEE WITHOUT CARDIOVERSION N/A 10/02/2021   Procedure: TRANSESOPHAGEAL ECHOCARDIOGRAM (TEE);  Surgeon: Pricilla Riffle, MD;  Location: [facility] ENDOSCOPY;  Service: Cardiovascular;  Laterality: N/A;   TRANSMETATARSAL AMPUTATION Right 08/16/2021   Procedure: CLOSURE OF RIGHT TRANSMETATARSAL AMPUTATION;  Surgeon: Nada Libman, MD;  Location: [facility] OR;  Service: Vascular;  Laterality: Right;    Allergies  Allergen Reactions   Peanut-Containing Drug Products Anaphylaxis and Hives   Penicillins Rash and Other (See Comments)    Rash in 2008.  Tolerated cefazolin in 2020. No reaction listed on MAR.   Shellfish Allergy Swelling   Chocolate Hives    Prior to Admission medications   Medication Sig Start Date End Date Taking? Authorizing Provider  acetaminophen (TYLENOL) 325 MG tablet Take 650 mg by mouth every 6 (six) hours as needed for fever or mild pain.   Yes [provider]  aspirin EC 81 MG tablet Take 1 tablet (81 mg total) by mouth daily. Swallow whole. 07/27/22  Yes Vann, Jessica U, DO  atorvastatin (LIPITOR) 40 MG tablet Place 1 tablet (40 mg total) into feeding tube daily. Patient taking differently: Take 40 mg by mouth daily. 10/26/21  Yes Burnadette Pop, MD  azelastine (OPTIVAR) 0.05 % ophthalmic solution Place 1 drop into both eyes 2 (two) times daily as needed (allergies). 11/13/22  Yes [provider]  cetirizine (ZYRTEC) 10 MG tablet Take 10 mg by mouth daily.   Yes [provider]  cinacalcet (SENSIPAR) 90 MG tablet Take 90 mg by mouth daily. 09/22/19  Yes [provider]  docusate sodium (COLACE) 100 MG capsule Take 100 mg by mouth at bedtime.   Yes [provider]  dorzolamide-timolol (COSOPT) 22.3-6.8 MG/ML ophthalmic solution Place 1 drop into the left eye 2 (two) times daily.  03/30/21  Yes [provider]  insulin aspart (NOVOLOG) 100 UNIT/ML injection Inject 0-6 Units into the skin every 4 (four) hours. Patient taking differently: Inject 0-10 Units into the skin 3 (three) times daily with meals. 121-150=1 unit 152-200=2 units 201-250=3 units 251-300=5 units 301-350=7 units 351-400=10 units 10/25/21  Yes Adhikari, Amrit, MD  insulin glargine (SEMGLEE) 100 UNIT/ML injection Inject 0.1 mLs (10 Units total) into the skin daily at 6 PM. Patient taking differently: Inject 12 Units into the skin 2 (two) times daily. 12/28/22  Yes Dahal, Melina Schools, MD  isosorbide mononitrate (IMDUR) 30 MG 24 hr tablet Take 1 tablet (30 mg total) by mouth daily. 07/27/22  Yes Vann, Jessica U, DO  melatonin 3 MG TABS tablet Take 3 mg by mouth at bedtime.   Yes [provider]  metoprolol tartrate (LOPRESSOR) 25 MG tablet Take 0.5 tablets (12.5 mg total) by mouth 2 (two) times daily. 07/27/22  Yes Vann, Jessica U, DO  midodrine (PROAMATINE) 5 MG tablet Take 5 mg by mouth See admin instructions. Take 5mg  (1  tablet) by mouth on Monday, Wednesdays, and Fridays. In addition, take 5mg  (1 tablet) every 8 hours as needed for hypotension.   Yes [provider]  Multiple Vitamin (MULTIVITAMIN WITH MINERALS) TABS tablet Take 1 tablet by mouth daily.   Yes [provider]  Nutritional Supplements (NUTRITIONAL DRINK) LIQD Take 120 mLs by mouth daily.   Yes [provider]  ondansetron (ZOFRAN) 4 MG tablet Take 4 mg by mouth every 6 (six) hours as needed for nausea/vomiting. 03/02/22  Yes [provider]  oxyCODONE-acetaminophen (PERCOCET) 5-325 MG tablet Take 1 tablet by mouth every 8 (eight) hours as needed for severe pain. Patient taking differently: Take 1 tablet by mouth every 6 (six) hours as needed for severe pain. 07/27/22  Yes Vann, Jessica U, DO  polyethylene glycol (MIRALAX / GLYCOLAX) 17 g packet Take 17 g by mouth daily at 6 PM.   Yes [provider]  thiamine 100 MG tablet Place 1 tablet (100 mg total) into feeding tube daily. Patient taking differently: Take 100 mg by mouth daily. 10/26/21  Yes Burnadette Pop, MD  Accu-Chek FastClix Lancets MISC Use as instructed to check blood sugar up to TID. E11.22 Patient taking differently: 1 each by Other route See admin instructions. Use as instructed to check blood sugar up to TID. E11.22 04/15/19   Hoy Register, MD  Blood Glucose Monitoring Suppl (ACCU-CHEK GUIDE ME) w/Device KIT 1 kit by Does not apply route 3 (three) times daily. Use to check BG at home up to 3 times daily. E11.22 04/15/19   Hoy Register, MD  Continuous Blood Gluc Transmit (DEXCOM G6 TRANSMITTER) MISC 1 Device by Does not apply route as directed. 09/12/20   Shamleffer, Konrad Dolores, MD  glucose blood (ACCU-CHEK GUIDE) test strip Use as instructed to check blood sugar up to TID. E11.22 Patient taking differently: 1 each by Other route See admin instructions. Use as instructed to check blood sugar up to TID. E11.22 07/17/19   Fulp, Cammie, MD  oxyCODONE (OXY IR/ROXICODONE) 5 MG immediate release tablet Take 5 mg by mouth daily as needed for severe pain. Patient not taking: Reported on 04/30/2023    [provider]    Social History   Socioeconomic History   Marital status: Single    Spouse name: Not on file   Number of children: Not on file   Years of education: Not on file   Highest education level: Not on file  Occupational History   Not on file  Tobacco Use   Smoking status: Never   Smokeless tobacco: Never  Vaping Use   Vaping status: Never Used  Substance and Sexual Activity   Alcohol use: No   Drug use: No   Sexual activity: Not on file  Other Topics Concern   Not on file  Social History Narrative   Not on file   Social Determinants of Health   Financial Resource Strain: Not on file  Food Insecurity: No Food Insecurity (04/30/2023)   Hunger Vital Sign    Worried About Running Out  of Food in the Last Year: Never true    Ran Out of Food in the Last Year: Never true  Transportation Needs: No Transportation Needs (04/30/2023)   PRAPARE - Administrator, Civil Service (Medical): No    Lack of Transportation (Non-Medical): No  Physical Activity: Not on file  Stress: Not on file  Social Connections: Not on file  Intimate Partner Violence: Not At Risk (04/30/2023)  Humiliation, Afraid, Rape, and Kick questionnaire    Fear of Current or Ex-Partner: No    Emotionally Abused: No    Physically Abused: No    Sexually Abused: No   Family History  Problem Relation Age of Onset   Diabetes Mother    Diabetes Father     ROS: Otherwise negative unless mentioned in HPI  Physical Examination  Vitals:   05/10/23 0756 05/10/23 1233  BP: 93/62 117/69  Pulse: 80 76  Resp: 16 13  Temp: 98.2 F (36.8 C) 97.9 F (36.6 C)  SpO2: 99%    Body mass index is 24.58 kg/m.  General: NAD Gait: Not observed HENT: WNL, normocephalic Pulmonary: normal non-labored breathing Cardiac: regular Abdomen: soft, NT/ND, no masses Skin: without rashes Vascular Exam/Pulses: right arm - Pulsatile fistula with thrill more centrally in the upper arm Extremities: without ischemic changes,  Musculoskeletal: no muscle wasting or atrophy  Neurologic: Somnolent Psychiatric: Somnolent Lymph:  Unremarkable  CBC    Component Value Date/Time   WBC 18.2 (H) 05/10/2023 0824   RBC 2.77 (L) 05/10/2023 0824   HGB 7.1 (L) 05/10/2023 0824   HGB 10.6 (L) 05/18/2020 0927   HCT 23.1 (L) 05/10/2023 0824   HCT 35.1 05/18/2020 0927   PLT 434 (H) 05/10/2023 0824   PLT 186 05/18/2020 0927   MCV 83.4 05/10/2023 0824   MCV 89 05/18/2020 0927   MCH 25.6 (L) 05/10/2023 0824   MCHC 30.7 05/10/2023 0824   RDW 20.0 (H) 05/10/2023 0824   RDW 13.9 05/18/2020 0927   LYMPHSABS 1.5 05/03/2023 1412   LYMPHSABS 1.6 05/18/2020 0927   MONOABS 0.4 05/03/2023 1412   EOSABS 0.1 05/03/2023 1412   EOSABS  0.6 (H) 05/18/2020 0927   BASOSABS 0.1 05/03/2023 1412   BASOSABS 0.0 05/18/2020 0927    BMET    Component Value Date/Time   NA 133 (L) 05/10/2023 0824   NA 133 (L) 05/18/2020 0927   K 3.9 05/10/2023 0824   CL 95 (L) 05/10/2023 0824   CO2 22 05/10/2023 0824   GLUCOSE 114 (H) 05/10/2023 0824   BUN 40 (H) 05/10/2023 0824   BUN 45 (H) 05/18/2020 0927   CREATININE 6.23 (H) 05/10/2023 0824   CALCIUM 8.5 (L) 05/10/2023 0824   CALCIUM 12.1 (H) 11/13/2021 2026   GFRNONAA 8 (L) 05/10/2023 0824   GFRAA 6 (L) 05/18/2020 0927    COAGS: Lab Results  Component Value Date   INR 1.3 (H) 04/30/2023   INR 1.3 (H) 06/28/2022   INR 1.2 08/10/2021      ASSESSMENT/PLAN: This is a 42 y.o. female with end-stage renal disease and concern for fistula occlusion.  There is a palpable thrill in the right upper arm with some pulsatility closer to the antecubital fossa.  I do not think the graft is occluded, possible nonocclusive thrombus I have asked for an ultrasound.  In the interim, interventional radiology has graciously offered to place a tunneled dialysis catheter with plans to declot should this be necessary.  I will sign off at this time.   Victorino Sparrow MD MS [facility] [phone] 05/10/2023  2:39 PM

## 2023-05-10 NOTE — Procedures (Signed)
Vascular and Interventional Radiology Procedure Note  Patient: [name] DOB: [birth year] Medical Record Number: [id number] Note Date/Time: 05/10/23 4:59 PM   Performing Physician: Roanna Banning, MD Assistant(s): None  Diagnosis: ESRD requiring Hemodialysis. Clotted fistula vs graft  Procedure: NON-TUNNELED HEMODIALYSIS CATHETER PLACEMENT  Anesthesia: Local Anesthetic Complications: None Estimated Blood Loss: Minimal Specimens:  None  Findings:  Successful placement of left-sided,  20 cm  NON-tunneled hemodialysis catheter with the tip of the catheter in the proximal right atrium.  Plan: Catheter ready for use.  See detailed procedure note with images in PACS. The patient tolerated the procedure well without incident or complication and was returned to Recovery in stable condition.    Roanna Banning, MD [facility] Radiology   Pager. [phone] Clinic. [phone]

## 2023-05-10 NOTE — Plan of Care (Signed)
  Problem: Coping: Goal: Ability to adjust to condition or change in health will improve Outcome: Progressing   Problem: Fluid Volume: Goal: Ability to maintain a balanced intake and output will improve Outcome: Progressing   Problem: Health Behavior/Discharge Planning: Goal: Ability to identify and utilize available resources and services will improve Outcome: Progressing Goal: Ability to manage health-related needs will improve Outcome: Progressing   Problem: Metabolic: Goal: Ability to maintain appropriate glucose levels will improve Outcome: Progressing   Problem: Nutritional: Goal: Maintenance of adequate nutrition will improve Outcome: Progressing Goal: Progress toward achieving an optimal weight will improve Outcome: Progressing   Problem: Skin Integrity: Goal: Risk for impaired skin integrity will decrease Outcome: Progressing   Problem: Tissue Perfusion: Goal: Adequacy of tissue perfusion will improve Outcome: Progressing   Problem: Education: Goal: Knowledge of General Education information will improve Description: Including pain rating scale, medication(s)/side effects and non-pharmacologic comfort measures Outcome: Progressing   Problem: Health Behavior/Discharge Planning: Goal: Ability to manage health-related needs will improve Outcome: Progressing   Problem: Clinical Measurements: Goal: Ability to maintain clinical measurements within normal limits will improve Outcome: Progressing Goal: Will remain free from infection Outcome: Progressing Goal: Diagnostic test results will improve Outcome: Progressing Goal: Respiratory complications will improve Outcome: Progressing Goal: Cardiovascular complication will be avoided Outcome: Progressing   Problem: Activity: Goal: Risk for activity intolerance will decrease Outcome: Progressing   Problem: Nutrition: Goal: Adequate nutrition will be maintained Outcome: Progressing   Problem: Coping: Goal:  Level of anxiety will decrease Outcome: Progressing   Problem: Elimination: Goal: Will not experience complications related to bowel motility Outcome: Progressing Goal: Will not experience complications related to urinary retention Outcome: Progressing   Problem: Pain Managment: Goal: General experience of comfort will improve Outcome: Progressing   Problem: Safety: Goal: Ability to remain free from injury will improve Outcome: Progressing   Problem: Skin Integrity: Goal: Risk for impaired skin integrity will decrease Outcome: Progressing   

## 2023-05-11 ENCOUNTER — Inpatient Hospital Stay (HOSPITAL_COMMUNITY): Payer: Medicare Other

## 2023-05-11 DIAGNOSIS — T82510A Breakdown (mechanical) of surgically created arteriovenous fistula, initial encounter: Secondary | ICD-10-CM | POA: Diagnosis not present

## 2023-05-11 DIAGNOSIS — K56609 Unspecified intestinal obstruction, unspecified as to partial versus complete obstruction: Secondary | ICD-10-CM | POA: Diagnosis not present

## 2023-05-11 LAB — RENAL FUNCTION PANEL
Albumin: 1.9 g/dL — ABNORMAL LOW (ref 3.5–5.0)
Anion gap: 11 (ref 5–15)
BUN: 14 mg/dL (ref 6–20)
CO2: 28 mmol/L (ref 22–32)
Calcium: 8.3 mg/dL — ABNORMAL LOW (ref 8.9–10.3)
Chloride: 95 mmol/L — ABNORMAL LOW (ref 98–111)
Creatinine, Ser: 3.11 mg/dL — ABNORMAL HIGH (ref 0.44–1.00)
GFR, Estimated: 19 mL/min — ABNORMAL LOW (ref >60)
Glucose, Bld: 80 mg/dL (ref 70–99)
Phosphorus: 3.2 mg/dL (ref 2.5–4.6)
Potassium: 3.5 mmol/L (ref 3.5–5.1)
Sodium: 134 mmol/L — ABNORMAL LOW (ref 135–145)

## 2023-05-11 LAB — CBC
HCT: 25.1 % — ABNORMAL LOW (ref 36.0–46.0)
Hemoglobin: 7.9 g/dL — ABNORMAL LOW (ref 12.0–15.0)
MCH: 25.6 pg — ABNORMAL LOW (ref 26.0–34.0)
MCHC: 31.5 g/dL (ref 30.0–36.0)
MCV: 81.5 fL (ref 80.0–100.0)
Platelets: 426 10*3/uL — ABNORMAL HIGH (ref 150–400)
RBC: 3.08 MIL/uL — ABNORMAL LOW (ref 3.87–5.11)
RDW: 20.3 % — ABNORMAL HIGH (ref 11.5–15.5)
WBC: 15.7 10*3/uL — ABNORMAL HIGH (ref 4.0–10.5)
nRBC: 0.3 % — ABNORMAL HIGH (ref 0.0–0.2)

## 2023-05-11 LAB — GLUCOSE, CAPILLARY
Glucose-Capillary: 110 mg/dL — ABNORMAL HIGH (ref 70–99)
Glucose-Capillary: 82 mg/dL (ref 70–99)
Glucose-Capillary: 85 mg/dL (ref 70–99)
Glucose-Capillary: 87 mg/dL (ref 70–99)
Glucose-Capillary: 91 mg/dL (ref 70–99)

## 2023-05-11 MED ORDER — DARBEPOETIN ALFA 60 MCG/0.3ML IJ SOSY
60.0000 ug | PREFILLED_SYRINGE | INTRAMUSCULAR | Status: DC
  Administered 2023-05-11: 60 ug via SUBCUTANEOUS
  Filled 2023-05-11: qty 0.3

## 2023-05-11 MED ORDER — SODIUM CHLORIDE 0.9 % IV BOLUS
500.0000 mL | Freq: Once | INTRAVENOUS | Status: AC
  Administered 2023-05-11: 500 mL via INTRAVENOUS

## 2023-05-11 NOTE — Progress Notes (Signed)
Patient ID: [name], female   DOB: 1980-11-20, 42 y.o.   MRN: [id number] [facility] Surgery Progress Note  7 Days Post-Op  Subjective: CC-  No acute events. Reports didn't sleep well due to interruptions/ noises.  Objective: Vital signs in last 24 hours: Temp:  [97.9 F (36.6 C)-98.4 F (36.9 C)] 98.2 F (36.8 C) (09/28 0730) Pulse Rate:  [76-91] 91 (09/28 0400) Resp:  [0-16] 15 (09/28 0730) BP: (73-127)/(49-72) 99/61 (09/28 0730) SpO2:  [97 %-100 %] 97 % (09/28 0730) Weight:  [58 kg] 58 kg (09/28 0530) Last BM Date : 05/10/23  Intake/Output from previous day: 09/27 0701 - 09/28 0700 In: 797.8 [I.V.:797.8] Out: 1575 [Stool:1175] Intake/Output this shift: No intake/output data recorded.  PE: Gen:  Alert, NAD Abd: soft, nondistended, appropriately tender only over incision, vac to midline with poor seal/ leaking, ostomy difficult to visualize through stool/ air and stool in bag  Lab Results:  Recent Labs    05/10/23 0824 05/11/23 0232  WBC 18.2* 15.7*  HGB 7.1* 7.9*  HCT 23.1* 25.1*  PLT 434* 426*   BMET Recent Labs    05/10/23 1950 05/11/23 0232  NA 135 134*  K 4.5 3.5  CL 97* 95*  CO2 22 28  GLUCOSE 107* 80  BUN 41* 14  CREATININE 6.74* 3.11*  CALCIUM 8.5* 8.3*   PT/INR No results for input(s): "LABPROT", "INR" in the last 72 hours. CMP     Component Value Date/Time   NA 134 (L) 05/11/2023 0232   NA 133 (L) 05/18/2020 0927   K 3.5 05/11/2023 0232   CL 95 (L) 05/11/2023 0232   CO2 28 05/11/2023 0232   GLUCOSE 80 05/11/2023 0232   BUN 14 05/11/2023 0232   BUN 45 (H) 05/18/2020 0927   CREATININE 3.11 (H) 05/11/2023 0232   CALCIUM 8.3 (L) 05/11/2023 0232   CALCIUM 12.1 (H) 11/13/2021 2026   PROT 6.2 (L) 05/01/2023 1020   PROT 7.6 05/18/2020 0927   ALBUMIN 1.9 (L) 05/11/2023 0232   ALBUMIN 3.9 05/18/2020 0927   AST 26 05/01/2023 1020   ALT 18 05/01/2023 1020   ALKPHOS 72 05/01/2023 1020   BILITOT 0.9 05/01/2023 1020   BILITOT <0.2  05/18/2020 0927   GFRNONAA 19 (L) 05/11/2023 0232   GFRAA 6 (L) 05/18/2020 0927   Lipase     Component Value Date/Time   LIPASE 40 12/20/2022 0918       Studies/Results: IR Fluoro Guide CV Line Left  Result Date: 05/10/2023 INDICATION: ESRD on HD.  Malfunctioning upper extremity AV fistula versus graft. EXAM: NON-TUNNELED CENTRAL VENOUS HEMODIALYSIS CATHETER PLACEMENT WITH ULTRASOUND AND FLUOROSCOPIC GUIDANCE COMPARISON:  Chest XR, 04/30/2023.  CTA head and neck, 09/22/2021. MEDICATIONS: Local anesthetic was administered.  10 mL lidocaine 1% FLUOROSCOPY TIME:  Fluoroscopic dose; 1 mGy COMPLICATIONS: None immediate. PROCEDURE: Informed written consent was obtained from the patient and/or patient's representative after a discussion of the risks, benefits, and alternatives to treatment. Questions regarding the procedure were encouraged and answered. The LEFT neck and chest were prepped with chlorhexidine in a sterile fashion, and a sterile drape was applied covering the operative field. Maximum barrier sterile technique with sterile gowns and gloves were used for the procedure. A timeout was performed prior to the initiation of the procedure. After the overlying soft tissues were anesthetized, a small venotomy incision was created and a micropuncture kit was utilized to access the internal jugular vein. Real-time ultrasound guidance was utilized for vascular access including the acquisition of  a permanent ultrasound image documenting patency of the accessed vessel. The microwire was utilized to measure appropriate catheter length. A stiff glidewire was advanced to the level of the IVC. Under fluoroscopic guidance, the venotomy was serially dilated, ultimately allowing placement of a 20 cm temporary Trialysis catheter with tip ultimately terminating within the superior aspect of the right atrium. Final catheter positioning was confirmed and documented with a spot radiographic image. The catheter  aspirates and flushes normally. The catheter was flushed with appropriate volume heparin dwells. The catheter exit site was secured with a 0-Prolene retention suture. A dressing was placed. The patient tolerated the procedure well without immediate post procedural complication. IMPRESSION: Successful placement of a LEFT internal jugular approach 20 cm non tunneled/temporary dialysis catheter The tip of the catheter is positioned within the proximal RIGHT atrium. The catheter is ready for immediate use. PLAN: This catheter may be converted to a tunneled dialysis catheter at a later date as indicated. Roanna Banning, MD [facility] Radiology Electronically Signed   By: Roanna Banning M.D.   On: 05/10/2023 17:07   IR US Guide Vasc Access Left  Result Date: 05/10/2023 INDICATION: ESRD on HD.  Malfunctioning upper extremity AV fistula versus graft. EXAM: NON-TUNNELED CENTRAL VENOUS HEMODIALYSIS CATHETER PLACEMENT WITH ULTRASOUND AND FLUOROSCOPIC GUIDANCE COMPARISON:  Chest XR, 04/30/2023.  CTA head and neck, 09/22/2021. MEDICATIONS: Local anesthetic was administered.  10 mL lidocaine 1% FLUOROSCOPY TIME:  Fluoroscopic dose; 1 mGy COMPLICATIONS: None immediate. PROCEDURE: Informed written consent was obtained from the patient and/or patient's representative after a discussion of the risks, benefits, and alternatives to treatment. Questions regarding the procedure were encouraged and answered. The LEFT neck and chest were prepped with chlorhexidine in a sterile fashion, and a sterile drape was applied covering the operative field. Maximum barrier sterile technique with sterile gowns and gloves were used for the procedure. A timeout was performed prior to the initiation of the procedure. After the overlying soft tissues were anesthetized, a small venotomy incision was created and a micropuncture kit was utilized to access the internal jugular vein. Real-time ultrasound  guidance was utilized for vascular access including the acquisition of a permanent ultrasound image documenting patency of the accessed vessel. The microwire was utilized to measure appropriate catheter length. A stiff glidewire was advanced to the level of the IVC. Under fluoroscopic guidance, the venotomy was serially dilated, ultimately allowing placement of a 20 cm temporary Trialysis catheter with tip ultimately terminating within the superior aspect of the right atrium. Final catheter positioning was confirmed and documented with a spot radiographic image. The catheter aspirates and flushes normally. The catheter was flushed with appropriate volume heparin dwells. The catheter exit site was secured with a 0-Prolene retention suture. A dressing was placed. The patient tolerated the procedure well without immediate post procedural complication. IMPRESSION: Successful placement of a LEFT internal jugular approach 20 cm non tunneled/temporary dialysis catheter The tip of the catheter is positioned within the proximal RIGHT atrium. The catheter is ready for immediate use. PLAN: This catheter may be converted to a tunneled dialysis catheter at a later date as indicated. Roanna Banning, MD [facility] Radiology Electronically Signed   By: Roanna Banning M.D.   On: 05/10/2023 17:07   CT ABDOMEN PELVIS W CONTRAST  Result Date: 05/09/2023 CLINICAL DATA:  Postop abdominal pain EXAM: CT ABDOMEN AND PELVIS WITH CONTRAST TECHNIQUE: Multidetector CT imaging of the abdomen and pelvis was performed using the standard protocol  following bolus administration of intravenous contrast. RADIATION DOSE REDUCTION: This exam was performed according to the departmental dose-optimization program which includes automated exposure control, adjustment of the mA and/or kV according to patient size and/or use of iterative reconstruction technique. CONTRAST:  75mL OMNIPAQUE IOHEXOL 350 MG/ML SOLN  COMPARISON:  CT 05/01/2023, 04/30/2023, 12/22/2022 FINDINGS: Lower chest: Lung bases demonstrate no acute airspace disease. Atelectasis at the left lung base. Hepatobiliary: Contracted gallbladder with small stones. No biliary dilatation Pancreas: No acute inflammatory changes.  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 Adrenals/Urinary Tract: Adrenal glands are normal. Atrophic hypoenhancing kidneys without hydronephrosis. The bladder is unremarkable. Stomach/Bowel: The stomach contains mild debris. Interval ileocecectomy with right abdominal ileostomy. Residual small bowel thickening in the pelvis, series 3, image 65. Decompressed appearance of colon but with suspected diffuse colon wall thickening/potential colitis. No frank intramural air. Vascular/Lymphatic: Extensive aortic vascular disease and branch vessel calcification. No aneurysm. No suspicious lymph nodes. Reproductive: Heterogenous uterine enhancement which appears hypoenhancing on the right side and at the lower uterine segment and cervix. Slightly thick-walled appearance of vaginal cuff with small fluid and focus of air. No suspicious adnexal mass Other: Negative for pelvic effusion. Small gas collections at the right gutter likely postoperative. Ventral wound. Soft tissue thickening at the umbilicus. Small gas and fluid collection within the anterior abdomen extending inferiorly to the upper pelvis, but without strong rim enhancement to suggest organized abscess. Other small foci of intraperitoneal gas likely postoperative. Musculoskeletal: No acute osseous abnormality. IMPRESSION: 1. Interval ileocecectomy with right abdominal ileostomy. Residual small bowel thickening in the pelvis, suggesting residual enteritis of infectious, inflammatory or  ischemic etiology. Decompressed appearance of colon but with suspected diffuse colon wall thickening/potential colitis. 2. Small gas and fluid collection within the anterior abdomen extending inferiorly to the upper pelvis, but without strong rim enhancement to suggest organized abscess. This could be postoperative in nature but attention on follow-up imaging. Small amounts of gas in the right colic gutter and peritoneal cavity also suspected to be secondary to postoperative gas. 3. Heterogenous uterine enhancement which appears hypoenhancing on the right side and at the lower uterine segment and cervix. Slightly thick-walled appearance of vaginal cuff with small fluid and focus of air. Correlate with pelvic exam. 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 5. Gallstones 6. Atrophic kidneys without hydronephrosis. 7. Aortic atherosclerosis. Aortic Atherosclerosis (ICD10-I70.0). Electronically Signed   By: Jasmine Pang M.D.   On: 05/09/2023 17:55    Anti-infectives: Anti-infectives (From admission, onward)    Start     Dose/Rate Route Frequency Ordered Stop   05/08/23 2200  cefTRIAXone (ROCEPHIN) 2 g in sodium chloride 0.9 % 100 mL IVPB        2 g 200 mL/hr over 30 Minutes Intravenous Every 24 hours 05/08/23 1613     05/08/23 2200  metroNIDAZOLE (FLAGYL) IVPB 500 mg        500 mg 100 mL/hr over 60 Minutes Intravenous Every 12 hours 05/08/23 1613     05/04/23 0200  cefTRIAXone (ROCEPHIN) 2 g in sodium chloride 0.9 % 100 mL IVPB        2 g 200 mL/hr over 30 Minutes Intravenous Every 24 hours 05/04/23 0102 05/08/23 0144   05/04/23 0200  metroNIDAZOLE (FLAGYL) IVPB 500 mg        500 mg 100 mL/hr over 60 Minutes Intravenous Every  12 hours 05/04/23 0102 05/09/23 1203   04/30/23 1800  ciprofloxacin (CIPRO) IVPB 400 mg  Status:  Discontinued        400 mg 200 mL/hr over 60 Minutes Intravenous Every 24 hours 04/30/23 1443 05/02/23 1140   04/30/23 1515  metroNIDAZOLE (FLAGYL) IVPB 500 mg  Status:  Discontinued         500 mg 100 mL/hr over 60 Minutes Intravenous Every 12 hours 04/30/23 1418 05/02/23 1140        Assessment/Plan 42 yo female presenting with abdominal pain, diarrhea, low grade fevers, dilated small bowel and colonic wall thickening on CT   POD#7 s/p diagnostic lap converted to ex-lap with extended ileo-cecectomy (mid-ileum to just past cecum), 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 Hillery Hunter - Intra-op findings were notable for adhesions in the RLQ w/ associated twisting of the mesentery resulting in SB ischemia. - CT 9/26 ordered due to leukocytosis but overall looks ok, some residual small bowel thickening but no large abscess. Will continue IV antibiotics. Patient clinically improving today with less abdominal pain, and she is tolerating more PO. WBC improving - WOC RN following for new ileostomy. High volume output improved today (1175), continue BID iron and fiber.  - Vac applied 9/26, plan for first change 9/30- poor seal, may need to go back to [facility] - mobilize, continue therapies - rec SNF   FEN: reg, Glucerna ID: Rocephin/Flagyl 9/21 >> Can stop abx today VTE: SCDs, SQH q 8h Foley: out Dispo: progressive care per primary    LOS: 11 days    Berna Bue, MD [facility] Surgery 05/11/2023, 8:31 AM Please see Amion for pager number during day hours 7:00am-4:30pm

## 2023-05-11 NOTE — Progress Notes (Signed)
Upon assessment pts ostomy was not attached to skin and the wound vac dressing was no longer attached on one side. General surgery PA was notified. Orders were given to remove the wound vac and change to a wet to dry dressing and replace the ostomy. Pain medication was given. Then wound vac removed and a wet to dry dressing was applied and ostomy pouch was replaced. Pt tolerated well.

## 2023-05-11 NOTE — Progress Notes (Signed)
VASCULAR LAB    Right upper extremity fistula duplex has been performed.  See CV proc for preliminary results.   Jemeka Wagler, RVT 05/11/2023, 1:56 PM

## 2023-05-11 NOTE — Progress Notes (Signed)
PROGRESS NOTE    [name]  XLK:[id number] DOB: 1981/03/13 DOA: 04/30/2023 PCP: [facility]    Brief Narrative:   [name] is a 42 y.o. 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  Onset day prior to admission associated with diarrhea.  In the ED, patient was febrile, tachycardic with lactic acid 1.9 followed by 2.5.  Chest x-ray with findings of atelectasis versus infiltrate.  CT abdomen/pelvis with contrast shows evidence of a small bowel obstruction with dilated ileal segment up to 4 cm, sigmoid segment consistent with colitis, diffuse gastric wall thickening, cholelithiasis with mild chronic prominence of the common bile duct.  General surgery was consulted.  TRH consulted for admission for further evaluation and management of small bowel obstruction.   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  Patient's symptoms continue to progress despite IV antibiotics and conservative measures.  C. difficile antigen positive, toxin negative.  GI PCR panel negative.  Patient underwent diagnostic laparoscopy with extended ileus cecotomy and end ileostomy on 05/04/2023 by Dr. Hillery Hunter.  Pathology notable for ischemia of ileum/cecum, acute appendicitis. Repeat CT abdomen/pelvis 9/26 with residual small bowel thickening consistent with enteritis secondary to infection versus  inflammation versus ischemia, small gas/fluid anterior abdomen extending inferiorly without rim enhancement. -- General Surgery following, appreciate assistance -- WBC 9.7>>15.5>>10.5>14.0>17.1>18.2>15.7 -- Ceftriaxone 2 g IV every 24 hours; complete course today -- Metronidazole 500 mg IV every 12 hours; complete course today -- Regular diet -- Continue monitor ostomy output; 1.175L last 24h -- Continue wound VAC, although poor seal and may need to return to wet-to-dry dressings per general surgery -- Ferrous sulfate 325 mg p.o. twice daily, FiberCon 625 mg p.o. twice daily for high output ostomy -- CBC daily  Type 1 diabetes mellitus Hemoglobin A1c 5.7, well-controlled.  Home regimen includes Semglee 12 units [place] BID and NovoLog SSI. -- Semglee 5 units [place] daily -- SSI for coverage -- CBGs qAC/HS  Peripheral vascular disease s/p bilateral BKA -- Continue to hold home statin for now  ESRD on HD MWF Patient with clotted AVF.  IR placement of temporary dialysis catheter on 9/27. -- Nephrology following for continue HD while inpatient, appreciate assistance -- Further management of AVF per nephrology/vascular surgery/IR  Essential hypertension Home regimen includes isosorbide mononitrate 30 mg p.o. daily. -- Continue to hold given borderline hypotension   Hyperlipidemia -- Hold home atorvastatin 40 mg p.o. daily  Anemia of chronic medical/renal disease -- Hgb 10.1>>11.4>>7.4>9.0>7.4>7.1>7.9 -- Transfuse for hemoglobin less than 7.0  Goals of care On 05/08/2023, patient discussed with multiple medical providers and staff that she did not want to continue with aggressive care to include hemodialysis in which she signed off early yesterday.  She wanted to focus more on comfort.  But as of 05/09/2023 she reports ""I want to live" and I will continue hemodialysis at this time.  Seen by palliative care with recommendations of full code, full scope of care at this time.  DVT  prophylaxis: heparin injection 5,000 Units Start: 04/30/23 0900    Code Status: Full Code Family Communication: No family present at bedside this morning  Disposition Plan:  Level of care: Med-Surg Status is: Inpatient Remains inpatient appropriate because: Awaiting general surgery sign off; plan to return back to SNF    Consultants:  General surgery Infectious disease PCCM Nephrology  Procedures:  Diagnostic laparoscopy with extended ileocecostomy and end ileostomy, Dr Hillery Hunter; 9/21  Antimicrobials:  Metronidazole 9/17 - 9/18, 9/20 - 9/28 Ciprofloxacin 9/17 - 5/17 Ceftriaxone 9/20 - 9/28   Subjective: Patient seen examined bedside, lying in bed.  Returned from hemodialysis this morning after RN unable to cannulate AVF due to being clotted off.  Primary RN present at bedside.  Continues to complain of abdominal pain.  Seen by general surgery this morning, maintains on IV antibiotics with wound VAC in place.  Continues with high output ostomy with 2 L output past 24 hours.  Continues to tolerate diet without nausea/vomiting. No other specific complaints or concerns at this time.  Denies headache, no chest pain, no shortness of breath, no fever.  No acute concerns overnight per nursing staff.  Objective: Vitals:   05/11/23 0300 05/11/23 0400 05/11/23 0530 05/11/23 0730  BP: 101/72 104/60  99/61  Pulse:  91    Resp: 16 14  15   Temp:  98.2 F (36.8 C)  98.2 F (36.8 C)  TempSrc:  Oral  Oral  SpO2: 97%   97%  Weight:   58 kg     Intake/Output Summary (Last 24 hours) at 05/11/2023 0954 Last data filed at 05/11/2023 0700 Gross per 24 hour  Intake 1037.8 ml  Output 1400 ml  Net -362.2 ml   Filed Weights   05/10/23 0338 05/10/23 0756 05/11/23 0530  Weight: 51.7 kg 59 kg 58 kg    Examination:  Physical Exam: GEN: NAD, chronically ill appearance HEENT: NCAT, PERRL, EOMI, sclera clear PULM: CTAB w/o wheezes/crackles, normal respiratory effort, on room air CV: RRR w/o  M/G/R GI: abd soft, mild TTP mid abdomen, nondistended, + BS, wound VAC to midline abdominal wound in place but alarming with poor seal, ostomy noted with soft brown stool in bag MSK: Noted bilateral BKA PSYCH: Depressed mood, flat affect     Data Reviewed: I have personally reviewed following labs and imaging studies  CBC: Recent Labs  Lab 05/07/23 0758 05/08/23 0846 05/09/23 0217 05/10/23 0824 05/11/23 0232  WBC 10.5 14.0* 17.1* 18.2* 15.7*  HGB 9.0* 7.4* 8.1* 7.1* 7.9*  HCT 28.4* 23.3* 26.1* 23.1* 25.1*  MCV 80.5 82.9 82.6 83.4 81.5  PLT 267 297 345 434* 426*   Basic Metabolic Panel: Recent Labs  Lab 05/06/23 0239 05/08/23 0846 05/10/23 0824 05/10/23 1950 05/11/23 0232  NA 129* 130* 133* 135 134*  K 3.7 3.5 3.9 4.5 3.5  CL 94* 90* 95* 97* 95*  CO2 22 23 22 22 28   GLUCOSE 132* 87 114* 107* 80  BUN 51* 38* 40* 41* 14  CREATININE 4.78* 4.76* 6.23* 6.74* 3.11*  CALCIUM 7.7* 7.9* 8.5* 8.5* 8.3*  PHOS 4.3 5.2* 6.3* 7.0* 3.2   GFR: Estimated Creatinine Clearance: 19.5 mL/min (A) (by C-G formula based on SCr of 3.11 mg/dL (H)). Liver Function Tests: Recent Labs  Lab 05/06/23 0239 05/08/23 0846 05/10/23 0824 05/10/23 1950 05/11/23 0232  ALBUMIN 1.8* 1.6* 1.8* 1.9* 1.9*   No results for input(s): "LIPASE", "AMYLASE" in the last 168 hours. No results for input(s): "AMMONIA" in the last  168 hours. Coagulation Profile: No results for input(s): "INR", "PROTIME" in the last 168 hours. Cardiac Enzymes: No results for input(s): "CKTOTAL", "CKMB", "CKMBINDEX", "TROPONINI" in the last 168 hours. BNP (last 3 results) No results for input(s): "PROBNP" in the last 8760 hours. HbA1C: No results for input(s): "HGBA1C" in the last 72 hours. CBG: Recent Labs  Lab 05/10/23 1221 05/10/23 1257 05/10/23 1612 05/11/23 0040 05/11/23 0631  GLUCAP 65* 72 100* 82 85   Lipid Profile: No results for input(s): "CHOL", "HDL", "LDLCALC", "TRIG", "CHOLHDL", "LDLDIRECT" in the  last 72 hours. Thyroid Function Tests: No results for input(s): "TSH", "T4TOTAL", "FREET4", "T3FREE", "THYROIDAB" in the last 72 hours. Anemia Panel: No results for input(s): "VITAMINB12", "FOLATE", "FERRITIN", "TIBC", "IRON", "RETICCTPCT" in the last 72 hours. Sepsis Labs: Recent Labs  Lab 05/05/23 4098 05/10/23 1932  LATICACIDVEN 1.4 1.5    Recent Results (from the past 240 hour(s))  Gastrointestinal Panel by PCR , Stool     Status: None   Collection Time: 05/02/23 12:06 AM   Specimen: Stool  Result Value Ref Range Status   Campylobacter species NOT DETECTED NOT DETECTED Final   Plesimonas shigelloides NOT DETECTED NOT DETECTED Final   Salmonella species NOT DETECTED NOT DETECTED Final   Yersinia enterocolitica NOT DETECTED NOT DETECTED Final   Vibrio species NOT DETECTED NOT DETECTED Final   Vibrio cholerae NOT DETECTED NOT DETECTED Final   Enteroaggregative E coli (EAEC) NOT DETECTED NOT DETECTED Final   Enteropathogenic E coli (EPEC) NOT DETECTED NOT DETECTED Final   Enterotoxigenic E coli (ETEC) NOT DETECTED NOT DETECTED Final   Shiga like toxin producing E coli (STEC) NOT DETECTED NOT DETECTED Final   Shigella/Enteroinvasive E coli (EIEC) NOT DETECTED NOT DETECTED Final   Cryptosporidium NOT DETECTED NOT DETECTED Final   Cyclospora cayetanensis NOT DETECTED NOT DETECTED Final   Entamoeba histolytica NOT DETECTED NOT DETECTED Final   Giardia lamblia NOT DETECTED NOT DETECTED Final   Adenovirus F40/41 NOT DETECTED NOT DETECTED Final   Astrovirus NOT DETECTED NOT DETECTED Final   Norovirus GI/GII NOT DETECTED NOT DETECTED Final   Rotavirus A NOT DETECTED NOT DETECTED Final   Sapovirus (I, II, IV, and V) NOT DETECTED NOT DETECTED Final    Comment: Performed at [facility], [address] [place], [place] [address]  C Difficile Quick Screen w PCR reflex     Status: Abnormal   Collection Time: 05/02/23 12:06 AM   Specimen: STOOL  Result Value Ref Range Status    C Diff antigen POSITIVE (A) NEGATIVE Final   C Diff toxin NEGATIVE NEGATIVE Final   C Diff interpretation Results are indeterminate. See PCR results.  Final    Comment: Performed at [facility], [address] [place], [place] [address]  C. Diff by PCR, Reflexed     Status: None   Collection Time: 05/02/23 12:06 AM  Result Value Ref Range Status   Toxigenic C. Difficile by PCR NEGATIVE NEGATIVE Final    Comment: Patient is colonized with non toxigenic C. difficile. May not need treatment unless significant symptoms are present. Performed at [facility], [address], [place], [place] [address]   Culture, blood (Routine X 2) w Reflex to ID Panel     Status: None   Collection Time: 05/03/23  1:02 PM   Specimen: BLOOD LEFT HAND  Result Value Ref Range Status   Specimen Description BLOOD LEFT HAND  Final   Special Requests Blood Culture adequate volume AEROBIC  BOTTLE ONLY  Final   Culture   Final    NO GROWTH 5 DAYS Performed at [facility], [address], [place], [place] [address]    Report Status 05/08/2023 FINAL  Final  Culture, blood (Routine X 2) w Reflex to ID Panel     Status: None   Collection Time: 05/03/23  1:05 PM   Specimen: BLOOD LEFT HAND  Result Value Ref Range Status   Specimen Description BLOOD LEFT HAND  Final   Special Requests   Final    AEROBIC BOTTLE ONLY Blood Culture results may not be optimal due to an inadequate volume of blood received in culture bottles   Culture   Final    NO GROWTH 5 DAYS Performed at [facility], [address], [place], [place] [address]    Report Status 05/08/2023 FINAL  Final         Radiology Studies: IR Fluoro Guide CV Line Left  Result Date: 05/10/2023 INDICATION: ESRD on HD.  Malfunctioning upper extremity AV fistula versus graft. EXAM: NON-TUNNELED CENTRAL VENOUS HEMODIALYSIS CATHETER PLACEMENT WITH ULTRASOUND AND FLUOROSCOPIC GUIDANCE COMPARISON:  Chest XR, 04/30/2023.  CTA head and neck,  09/22/2021. MEDICATIONS: Local anesthetic was administered.  10 mL lidocaine 1% FLUOROSCOPY TIME:  Fluoroscopic dose; 1 mGy COMPLICATIONS: None immediate. PROCEDURE: Informed written consent was obtained from the patient and/or patient's representative after a discussion of the risks, benefits, and alternatives to treatment. Questions regarding the procedure were encouraged and answered. The LEFT neck and chest were prepped with chlorhexidine in a sterile fashion, and a sterile drape was applied covering the operative field. Maximum barrier sterile technique with sterile gowns and gloves were used for the procedure. A timeout was performed prior to the initiation of the procedure. After the overlying soft tissues were anesthetized, a small venotomy incision was created and a micropuncture kit was utilized to access the internal jugular vein. Real-time ultrasound guidance was utilized for vascular access including the acquisition of a permanent ultrasound image documenting patency of the accessed vessel. The microwire was utilized to measure appropriate catheter length. A stiff glidewire was advanced to the level of the IVC. Under fluoroscopic guidance, the venotomy was serially dilated, ultimately allowing placement of a 20 cm temporary Trialysis catheter with tip ultimately terminating within the superior aspect of the right atrium. Final catheter positioning was confirmed and documented with a spot radiographic image. The catheter aspirates and flushes normally. The catheter was flushed with appropriate volume heparin dwells. The catheter exit site was secured with a 0-Prolene retention suture. A dressing was placed. The patient tolerated the procedure well without immediate post procedural complication. IMPRESSION: Successful placement of a LEFT internal jugular approach 20 cm non tunneled/temporary dialysis catheter The tip of the catheter is positioned within the proximal RIGHT atrium. The catheter is ready for  immediate use. PLAN: This catheter may be converted to a tunneled dialysis catheter at a later date as indicated. Roanna Banning, MD [facility] Radiology Electronically Signed   By: Roanna Banning M.D.   On: 05/10/2023 17:07   IR US Guide Vasc Access Left  Result Date: 05/10/2023 INDICATION: ESRD on HD.  Malfunctioning upper extremity AV fistula versus graft. EXAM: NON-TUNNELED CENTRAL VENOUS HEMODIALYSIS CATHETER PLACEMENT WITH ULTRASOUND AND FLUOROSCOPIC GUIDANCE COMPARISON:  Chest XR, 04/30/2023.  CTA head and neck, 09/22/2021. MEDICATIONS: Local anesthetic was administered.  10 mL lidocaine 1% FLUOROSCOPY TIME:  Fluoroscopic dose; 1 mGy COMPLICATIONS: None immediate. PROCEDURE:  Informed written consent was obtained from the patient and/or patient's representative after a discussion of the risks, benefits, and alternatives to treatment. Questions regarding the procedure were encouraged and answered. The LEFT neck and chest were prepped with chlorhexidine in a sterile fashion, and a sterile drape was applied covering the operative field. Maximum barrier sterile technique with sterile gowns and gloves were used for the procedure. A timeout was performed prior to the initiation of the procedure. After the overlying soft tissues were anesthetized, a small venotomy incision was created and a micropuncture kit was utilized to access the internal jugular vein. Real-time ultrasound guidance was utilized for vascular access including the acquisition of a permanent ultrasound image documenting patency of the accessed vessel. The microwire was utilized to measure appropriate catheter length. A stiff glidewire was advanced to the level of the IVC. Under fluoroscopic guidance, the venotomy was serially dilated, ultimately allowing placement of a 20 cm temporary Trialysis catheter with tip ultimately terminating within the superior aspect of the right atrium. Final catheter  positioning was confirmed and documented with a spot radiographic image. The catheter aspirates and flushes normally. The catheter was flushed with appropriate volume heparin dwells. The catheter exit site was secured with a 0-Prolene retention suture. A dressing was placed. The patient tolerated the procedure well without immediate post procedural complication. IMPRESSION: Successful placement of a LEFT internal jugular approach 20 cm non tunneled/temporary dialysis catheter The tip of the catheter is positioned within the proximal RIGHT atrium. The catheter is ready for immediate use. PLAN: This catheter may be converted to a tunneled dialysis catheter at a later date as indicated. Roanna Banning, MD [facility] Radiology Electronically Signed   By: Roanna Banning M.D.   On: 05/10/2023 17:07   CT ABDOMEN PELVIS W CONTRAST  Result Date: 05/09/2023 CLINICAL DATA:  Postop abdominal pain EXAM: CT ABDOMEN AND PELVIS WITH CONTRAST TECHNIQUE: Multidetector CT imaging of the abdomen and pelvis was performed using the standard protocol following bolus administration of intravenous contrast. RADIATION DOSE REDUCTION: This exam was performed according to the departmental dose-optimization program which includes automated exposure control, adjustment of the mA and/or kV according to patient size and/or use of iterative reconstruction technique. CONTRAST:  75mL OMNIPAQUE IOHEXOL 350 MG/ML SOLN COMPARISON:  CT 05/01/2023, 04/30/2023, 12/22/2022 FINDINGS: Lower chest: Lung bases demonstrate no acute airspace disease. Atelectasis at the left lung base. Hepatobiliary: Contracted gallbladder with small stones. No biliary dilatation Pancreas: No acute inflammatory changes.  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 Adrenals/Urinary Tract: Adrenal glands are normal. Atrophic hypoenhancing kidneys without hydronephrosis. The bladder is unremarkable. Stomach/Bowel: The stomach contains mild debris. Interval ileocecectomy with right abdominal ileostomy. Residual small bowel thickening in the pelvis, series 3, image 65. Decompressed appearance of colon but with suspected diffuse colon wall thickening/potential colitis. No frank intramural air. Vascular/Lymphatic: Extensive aortic vascular disease and branch vessel calcification. No aneurysm. No suspicious lymph nodes. Reproductive: Heterogenous uterine enhancement which appears hypoenhancing on the right side and at the lower uterine segment and cervix. Slightly thick-walled appearance of vaginal cuff with small fluid and focus of air. No suspicious adnexal mass Other: Negative for pelvic effusion. Small gas collections at the right gutter likely postoperative. Ventral wound. Soft tissue thickening at the umbilicus. Small gas and fluid collection within the anterior abdomen extending inferiorly to  the upper pelvis, but without strong rim enhancement to suggest organized abscess. Other small foci of intraperitoneal gas likely postoperative. Musculoskeletal: No acute osseous abnormality. IMPRESSION: 1. Interval ileocecectomy with right abdominal ileostomy. Residual small bowel thickening in the pelvis, suggesting residual enteritis of infectious, inflammatory or ischemic etiology. Decompressed appearance of colon but with suspected diffuse colon wall thickening/potential colitis. 2. Small gas and fluid collection within the anterior abdomen extending inferiorly to the upper pelvis, but without strong rim enhancement to suggest organized abscess. This could be postoperative in nature but attention on follow-up imaging. Small amounts of gas in the right colic gutter and peritoneal cavity also suspected to be secondary to postoperative gas. 3.  Heterogenous uterine enhancement which appears hypoenhancing on the right side and at the lower uterine segment and cervix. Slightly thick-walled appearance of vaginal cuff with small fluid and focus of air. Correlate with pelvic exam. 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 5. Gallstones 6. Atrophic kidneys without hydronephrosis. 7. Aortic atherosclerosis. Aortic Atherosclerosis (ICD10-I70.0). Electronically Signed   By: Jasmine Pang M.D.   On: 05/09/2023 17:55        Scheduled Meds:  Chlorhexidine Gluconate Cloth  6 each Topical Q0600   Chlorhexidine Gluconate Cloth  6 each Topical Q0600   darbepoetin (ARANESP) injection - DIALYSIS  60 mcg Subcutaneous Q Fri-1800   doxercalciferol  4 mcg Intravenous Q M,W,F-HD   feeding supplement (GLUCERNA SHAKE)  237 mL Oral TID BM   ferrous sulfate  325 mg Oral BID WC   heparin  5,000 Units Subcutaneous Q8H   insulin aspart  0-6 Units Subcutaneous TID WC   insulin glargine-yfgn  5 Units Subcutaneous Daily   pantoprazole  40 mg Oral QHS   polycarbophil  625 mg Oral BID   sevelamer carbonate  0.8 g Oral TID WC   Continuous Infusions:  anticoagulant sodium citrate     cefTRIAXone (ROCEPHIN)  IV 2 g (05/10/23 2005)   dextrose 5 % and 0.9 % NaCl 50 mL/hr at 05/11/23 0600   metronidazole 500 mg (05/11/23 0134)     LOS: 11 days    Time spent: 53 minutes spent on chart review, discussion with nursing staff, consultants, updating family and interview/physical exam; more than 50% of that time was spent in counseling and/or coordination of care.    Alvira Philips [place], DO [facility] Available via Epic secure chat 7am-7pm After these hours, please refer to coverage provider listed on [email or web address] 05/11/2023, 9:54 AM

## 2023-05-11 NOTE — Progress Notes (Signed)
[facility] NEPHROLOGY PROGRESS NOTE  OP HD orders. Dialysis Orders: [facility] MWF  3h  400/1.5  49kg   R AVF   -Heparin none - last OP HD 9/16, post wt 51kg, coming off 2- 4kg over the last 3 wks - mircera IV q 2 wks, last 9/13, due 9/27 - hectorol 4 mcg IV three times per week.   Assessment/ Plan: # Abdominal pain - partial SBO s/p ex-lap with extended ileo-cecectomy with end ileostomy. Per primary/Gen surgery.   #Sepsis 2/2 to ischemic Colitis - Management per primary.   # ESRD - on HD MWF.  Unable to cannulate AV fistula on 9/27because of clotting.  IR placed temporary HD catheter and received dialysis.  I have consulted vascular surgery team as well recommending duplex of AV fistula.  We will follow duplex and vascular planning.  Next HD on Monday. IR was contacted for AVF declotting as well.  #Hyperkalemia. Resolved.   # HTN/Volume - no vol excess on exam. UF wth HD.   # Anemia due to ESRD - Hgb 9, continue ESA.  Monitor hemoglobin.  # CKD-MBD -start sevelamer for hyperphosphatemia.  Cont IV vdra. Resume sensipar when eating.   #PAD - bilat AKA.   # GOC: Seen by palliative care team, patient would like to continue full scope of treatment at this time.  Subjective: Seen and examined at the bedside.  Had dialysis yesterday after temporary HD catheter placement.  Seen by vascular surgeon.  No nausea, vomiting, chest pain or shortness of breath. Objective Vital signs in last 24 hours: Vitals:   05/11/23 0300 05/11/23 0400 05/11/23 0530 05/11/23 0730  BP: 101/72 104/60  99/61  Pulse:  91    Resp: 16 14  15   Temp:  98.2 F (36.8 C)  98.2 F (36.8 C)  TempSrc:  Oral  Oral  SpO2: 97%   97%  Weight:   58 kg    Weight change: 7.29 kg  Intake/Output Summary (Last 24 hours) at 05/11/2023 1106 Last data filed at 05/11/2023 0700 Gross per 24 hour  Intake 1037.8 ml  Output 1400 ml  Net -362.2 ml       Labs: RENAL PANEL Recent Labs  Lab  05/06/23 0239 05/08/23 0846 05/10/23 0824 05/10/23 1950 05/11/23 0232  NA 129* 130* 133* 135 134*  K 3.7 3.5 3.9 4.5 3.5  CL 94* 90* 95* 97* 95*  CO2 22 23 22 22 28   GLUCOSE 132* 87 114* 107* 80  BUN 51* 38* 40* 41* 14  CREATININE 4.78* 4.76* 6.23* 6.74* 3.11*  CALCIUM 7.7* 7.9* 8.5* 8.5* 8.3*  PHOS 4.3 5.2* 6.3* 7.0* 3.2  ALBUMIN 1.8* 1.6* 1.8* 1.9* 1.9*    Liver Function Tests: Recent Labs  Lab 05/10/23 0824 05/10/23 1950 05/11/23 0232  ALBUMIN 1.8* 1.9* 1.9*   No results for input(s): "LIPASE", "AMYLASE" in the last 168 hours. No results for input(s): "AMMONIA" in the last 168 hours. CBC: Recent Labs    07/25/22 0532 07/26/22 0654 05/07/23 0758 05/08/23 0846 05/09/23 0217 05/10/23 0824 05/11/23 0232  HGB 9.8*   < > 9.0* 7.4* 8.1* 7.1* 7.9*  MCV 85.3   < > 80.5 82.9 82.6 83.4 81.5  FERRITIN 562*  --   --   --   --   --   --   TIBC 179*  --   --   --   --   --   --   IRON 56  --   --   --   --   --   --    < > =  values in this interval not displayed.    Cardiac Enzymes: No results for input(s): "CKTOTAL", "CKMB", "CKMBINDEX", "TROPONINI" in the last 168 hours. CBG: Recent Labs  Lab 05/10/23 1257 05/10/23 1612 05/11/23 0040 05/11/23 0631 05/11/23 1102  GLUCAP 72 100* 82 85 91    Iron Studies: No results for input(s): "IRON", "TIBC", "TRANSFERRIN", "FERRITIN" in the last 72 hours. Studies/Results: IR Fluoro Guide CV Line Left  Result Date: 05/10/2023 INDICATION: ESRD on HD.  Malfunctioning upper extremity AV fistula versus graft. EXAM: NON-TUNNELED CENTRAL VENOUS HEMODIALYSIS CATHETER PLACEMENT WITH ULTRASOUND AND FLUOROSCOPIC GUIDANCE COMPARISON:  Chest XR, 04/30/2023.  CTA head and neck, 09/22/2021. MEDICATIONS: Local anesthetic was administered.  10 mL lidocaine 1% FLUOROSCOPY TIME:  Fluoroscopic dose; 1 mGy COMPLICATIONS: None immediate. PROCEDURE: Informed written consent was obtained from the patient and/or patient's representative after a discussion  of the risks, benefits, and alternatives to treatment. Questions regarding the procedure were encouraged and answered. The LEFT neck and chest were prepped with chlorhexidine in a sterile fashion, and a sterile drape was applied covering the operative field. Maximum barrier sterile technique with sterile gowns and gloves were used for the procedure. A timeout was performed prior to the initiation of the procedure. After the overlying soft tissues were anesthetized, a small venotomy incision was created and a micropuncture kit was utilized to access the internal jugular vein. Real-time ultrasound guidance was utilized for vascular access including the acquisition of a permanent ultrasound image documenting patency of the accessed vessel. The microwire was utilized to measure appropriate catheter length. A stiff glidewire was advanced to the level of the IVC. Under fluoroscopic guidance, the venotomy was serially dilated, ultimately allowing placement of a 20 cm temporary Trialysis catheter with tip ultimately terminating within the superior aspect of the right atrium. Final catheter positioning was confirmed and documented with a spot radiographic image. The catheter aspirates and flushes normally. The catheter was flushed with appropriate volume heparin dwells. The catheter exit site was secured with a 0-Prolene retention suture. A dressing was placed. The patient tolerated the procedure well without immediate post procedural complication. IMPRESSION: Successful placement of a LEFT internal jugular approach 20 cm non tunneled/temporary dialysis catheter The tip of the catheter is positioned within the proximal RIGHT atrium. The catheter is ready for immediate use. PLAN: This catheter may be converted to a tunneled dialysis catheter at a later date as indicated. Roanna Banning, MD [facility] Radiology Electronically Signed   By: Roanna Banning M.D.   On: 05/10/2023 17:07    IR US Guide Vasc Access Left  Result Date: 05/10/2023 INDICATION: ESRD on HD.  Malfunctioning upper extremity AV fistula versus graft. EXAM: NON-TUNNELED CENTRAL VENOUS HEMODIALYSIS CATHETER PLACEMENT WITH ULTRASOUND AND FLUOROSCOPIC GUIDANCE COMPARISON:  Chest XR, 04/30/2023.  CTA head and neck, 09/22/2021. MEDICATIONS: Local anesthetic was administered.  10 mL lidocaine 1% FLUOROSCOPY TIME:  Fluoroscopic dose; 1 mGy COMPLICATIONS: None immediate. PROCEDURE: Informed written consent was obtained from the patient and/or patient's representative after a discussion of the risks, benefits, and alternatives to treatment. Questions regarding the procedure were encouraged and answered. The LEFT neck and chest were prepped with chlorhexidine in a sterile fashion, and a sterile drape was applied covering the operative field. Maximum barrier sterile technique with sterile gowns and gloves were used for the procedure. A timeout was performed prior to the initiation of the procedure. After the overlying soft tissues were anesthetized, a small venotomy incision was created and a micropuncture kit was  utilized to access the internal jugular vein. Real-time ultrasound guidance was utilized for vascular access including the acquisition of a permanent ultrasound image documenting patency of the accessed vessel. The microwire was utilized to measure appropriate catheter length. A stiff glidewire was advanced to the level of the IVC. Under fluoroscopic guidance, the venotomy was serially dilated, ultimately allowing placement of a 20 cm temporary Trialysis catheter with tip ultimately terminating within the superior aspect of the right atrium. Final catheter positioning was confirmed and documented with a spot radiographic image. The catheter aspirates and flushes normally. The catheter was flushed with appropriate volume heparin dwells. The catheter exit site was secured with a 0-Prolene retention suture. A dressing was  placed. The patient tolerated the procedure well without immediate post procedural complication. IMPRESSION: Successful placement of a LEFT internal jugular approach 20 cm non tunneled/temporary dialysis catheter The tip of the catheter is positioned within the proximal RIGHT atrium. The catheter is ready for immediate use. PLAN: This catheter may be converted to a tunneled dialysis catheter at a later date as indicated. Roanna Banning, MD [facility] Radiology Electronically Signed   By: Roanna Banning M.D.   On: 05/10/2023 17:07   CT ABDOMEN PELVIS W CONTRAST  Result Date: 05/09/2023 CLINICAL DATA:  Postop abdominal pain EXAM: CT ABDOMEN AND PELVIS WITH CONTRAST TECHNIQUE: Multidetector CT imaging of the abdomen and pelvis was performed using the standard protocol following bolus administration of intravenous contrast. RADIATION DOSE REDUCTION: This exam was performed according to the departmental dose-optimization program which includes automated exposure control, adjustment of the mA and/or kV according to patient size and/or use of iterative reconstruction technique. CONTRAST:  75mL OMNIPAQUE IOHEXOL 350 MG/ML SOLN COMPARISON:  CT 05/01/2023, 04/30/2023, 12/22/2022 FINDINGS: Lower chest: Lung bases demonstrate no acute airspace disease. Atelectasis at the left lung base. Hepatobiliary: Contracted gallbladder with small stones. No biliary dilatation Pancreas: No acute inflammatory changes.  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 Adrenals/Urinary Tract: Adrenal glands are normal. Atrophic hypoenhancing kidneys without hydronephrosis. The bladder is unremarkable. Stomach/Bowel: The stomach contains mild debris. Interval ileocecectomy with right abdominal  ileostomy. Residual small bowel thickening in the pelvis, series 3, image 65. Decompressed appearance of colon but with suspected diffuse colon wall thickening/potential colitis. No frank intramural air. Vascular/Lymphatic: Extensive aortic vascular disease and branch vessel calcification. No aneurysm. No suspicious lymph nodes. Reproductive: Heterogenous uterine enhancement which appears hypoenhancing on the right side and at the lower uterine segment and cervix. Slightly thick-walled appearance of vaginal cuff with small fluid and focus of air. No suspicious adnexal mass Other: Negative for pelvic effusion. Small gas collections at the right gutter likely postoperative. Ventral wound. Soft tissue thickening at the umbilicus. Small gas and fluid collection within the anterior abdomen extending inferiorly to the upper pelvis, but without strong rim enhancement to suggest organized abscess. Other small foci of intraperitoneal gas likely postoperative. Musculoskeletal: No acute osseous abnormality. IMPRESSION: 1. Interval ileocecectomy with right abdominal ileostomy. Residual small bowel thickening in the pelvis, suggesting residual enteritis of infectious, inflammatory or ischemic etiology. Decompressed appearance of colon but with suspected diffuse colon wall thickening/potential colitis. 2. Small gas and fluid collection within the anterior abdomen extending inferiorly to the upper pelvis, but without strong rim enhancement to suggest organized abscess. This could be postoperative in nature but attention on follow-up  imaging. Small amounts of gas in the right colic gutter and peritoneal cavity also suspected to be secondary to postoperative gas. 3. Heterogenous uterine enhancement which appears hypoenhancing on the right side and at the lower uterine segment and cervix. Slightly thick-walled appearance of vaginal cuff with small fluid and focus of air. Correlate with pelvic exam. 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 5. Gallstones 6. Atrophic kidneys without hydronephrosis. 7. Aortic atherosclerosis. Aortic Atherosclerosis (ICD10-I70.0). Electronically Signed   By: Jasmine Pang M.D.   On: 05/09/2023 17:55    Medications: Infusions:  anticoagulant sodium citrate     cefTRIAXone (ROCEPHIN)  IV 2 g (05/10/23 2005)   dextrose 5 % and 0.9 % NaCl 50 mL/hr at 05/11/23 0600   metronidazole 500 mg (05/11/23 0134)    Scheduled Medications:  Chlorhexidine Gluconate Cloth  6 each Topical Q0600   Chlorhexidine Gluconate Cloth  6 each Topical Q0600   darbepoetin (ARANESP) injection - DIALYSIS  60 mcg Subcutaneous Q Fri-1800   doxercalciferol  4 mcg Intravenous Q M,W,F-HD   feeding supplement (GLUCERNA SHAKE)  237 mL Oral TID BM   ferrous sulfate  325 mg Oral BID WC   heparin  5,000 Units Subcutaneous Q8H   insulin aspart  0-6 Units Subcutaneous TID WC   insulin glargine-yfgn  5 Units Subcutaneous Daily   pantoprazole  40 mg Oral QHS   polycarbophil  625 mg Oral BID   sevelamer carbonate  0.8 g Oral TID WC    have reviewed scheduled and prn medications.  Physical Exam: General: More alert and awake today.  Lying comfortable. Heart:RRR, s1s2 nl Lungs:clear b/l, no crackle Abdomen: Soft, nondistended Extremities: Bilateral BKA Dialysis Access: Right AV fistula has faint thrill  [name] 05/11/2023,11:06 AM  LOS: 11 days

## 2023-05-11 NOTE — Plan of Care (Signed)
  Problem: Coping: Goal: Ability to adjust to condition or change in health will improve Outcome: Progressing   Problem: Fluid Volume: Goal: Ability to maintain a balanced intake and output will improve Outcome: Progressing   Problem: Health Behavior/Discharge Planning: Goal: Ability to identify and utilize available resources and services will improve Outcome: Progressing Goal: Ability to manage health-related needs will improve Outcome: Progressing   Problem: Metabolic: Goal: Ability to maintain appropriate glucose levels will improve Outcome: Progressing   Problem: Nutritional: Goal: Maintenance of adequate nutrition will improve Outcome: Progressing Goal: Progress toward achieving an optimal weight will improve Outcome: Progressing   Problem: Skin Integrity: Goal: Risk for impaired skin integrity will decrease Outcome: Progressing   Problem: Tissue Perfusion: Goal: Adequacy of tissue perfusion will improve Outcome: Progressing   Problem: Education: Goal: Knowledge of General Education information will improve Description: Including pain rating scale, medication(s)/side effects and non-pharmacologic comfort measures Outcome: Progressing   Problem: Health Behavior/Discharge Planning: Goal: Ability to manage health-related needs will improve Outcome: Progressing   Problem: Clinical Measurements: Goal: Ability to maintain clinical measurements within normal limits will improve Outcome: Progressing Goal: Will remain free from infection Outcome: Progressing Goal: Diagnostic test results will improve Outcome: Progressing Goal: Respiratory complications will improve Outcome: Progressing Goal: Cardiovascular complication will be avoided Outcome: Progressing   Problem: Activity: Goal: Risk for activity intolerance will decrease Outcome: Progressing   Problem: Nutrition: Goal: Adequate nutrition will be maintained Outcome: Progressing   Problem: Coping: Goal:  Level of anxiety will decrease Outcome: Progressing   Problem: Elimination: Goal: Will not experience complications related to bowel motility Outcome: Progressing Goal: Will not experience complications related to urinary retention Outcome: Progressing   Problem: Pain Managment: Goal: General experience of comfort will improve Outcome: Progressing   Problem: Safety: Goal: Ability to remain free from injury will improve Outcome: Progressing   Problem: Skin Integrity: Goal: Risk for impaired skin integrity will decrease Outcome: Progressing   

## 2023-05-12 DIAGNOSIS — K56609 Unspecified intestinal obstruction, unspecified as to partial versus complete obstruction: Secondary | ICD-10-CM | POA: Diagnosis not present

## 2023-05-12 LAB — CBC
HCT: 24.1 % — ABNORMAL LOW (ref 36.0–46.0)
Hemoglobin: 7.3 g/dL — ABNORMAL LOW (ref 12.0–15.0)
MCH: 25.3 pg — ABNORMAL LOW (ref 26.0–34.0)
MCHC: 30.3 g/dL (ref 30.0–36.0)
MCV: 83.4 fL (ref 80.0–100.0)
Platelets: 420 10*3/uL — ABNORMAL HIGH (ref 150–400)
RBC: 2.89 MIL/uL — ABNORMAL LOW (ref 3.87–5.11)
RDW: 20.4 % — ABNORMAL HIGH (ref 11.5–15.5)
WBC: 21.8 10*3/uL — ABNORMAL HIGH (ref 4.0–10.5)
nRBC: 0.1 % (ref 0.0–0.2)

## 2023-05-12 LAB — GLUCOSE, CAPILLARY
Glucose-Capillary: 173 mg/dL — ABNORMAL HIGH (ref 70–99)
Glucose-Capillary: 111 mg/dL — ABNORMAL HIGH (ref 70–99)
Glucose-Capillary: 186 mg/dL — ABNORMAL HIGH (ref 70–99)
Glucose-Capillary: 205 mg/dL — ABNORMAL HIGH (ref 70–99)

## 2023-05-12 LAB — PROCALCITONIN: Procalcitonin: 4.89 ng/mL

## 2023-05-12 MED ORDER — HYDROMORPHONE HCL 1 MG/ML IJ SOLN
0.5000 mg | INTRAMUSCULAR | Status: DC | PRN
  Administered 2023-05-13 – 2023-05-24 (×15): 0.5 mg via INTRAVENOUS
  Filled 2023-05-12 (×18): qty 0.5

## 2023-05-12 MED ORDER — DARBEPOETIN ALFA 60 MCG/0.3ML IJ SOSY
60.0000 ug | PREFILLED_SYRINGE | INTRAMUSCULAR | Status: DC
  Administered 2023-05-17 – 2023-05-24 (×2): 60 ug via SUBCUTANEOUS
  Filled 2023-05-12 (×2): qty 0.3

## 2023-05-12 MED ORDER — LOPERAMIDE HCL 2 MG PO CAPS
2.0000 mg | ORAL_CAPSULE | Freq: Two times a day (BID) | ORAL | Status: DC
  Administered 2023-05-12 (×2): 2 mg via ORAL
  Filled 2023-05-12 (×2): qty 1

## 2023-05-12 MED ORDER — CHLORHEXIDINE GLUCONATE CLOTH 2 % EX PADS
6.0000 | MEDICATED_PAD | Freq: Every day | CUTANEOUS | Status: DC
  Administered 2023-05-12 – 2023-05-14 (×2): 6 via TOPICAL

## 2023-05-12 NOTE — Progress Notes (Signed)
PROGRESS NOTE    [name]  ZOX:[id number] DOB: 04/29/1981 DOA: 04/30/2023 PCP: Inc, [facility]    Brief Narrative:   [name] is a 42 y.o. 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  Onset day prior to admission associated with diarrhea.  In the ED, patient was febrile, tachycardic with lactic acid 1.9 followed by 2.5.  Chest x-ray with findings of atelectasis versus infiltrate.  CT abdomen/pelvis with contrast shows evidence of a small bowel obstruction with dilated ileal segment up to 4 cm, sigmoid segment consistent with colitis, diffuse gastric wall thickening, cholelithiasis with mild chronic prominence of the common bile duct.  General surgery was consulted.  TRH consulted for admission for further evaluation and management of small bowel obstruction.   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  Patient's symptoms continue to progress despite IV antibiotics and conservative measures.  C. difficile antigen positive, toxin negative.  GI PCR panel negative.  Patient underwent diagnostic laparoscopy with extended ileus cecotomy and end ileostomy on 05/04/2023 by Dr. Hillery Hunter.  Pathology notable for ischemia of ileum/cecum, acute appendicitis. Repeat CT abdomen/pelvis 9/26 with residual small bowel thickening consistent with enteritis secondary to infection versus  inflammation versus ischemia, small gas/fluid anterior abdomen extending inferiorly without rim enhancement.  Completed course of 5 antibiotics with ceftriaxone and metronidazole on 9/28. -- General Surgery following, appreciate assistance -- WBC 9.7>>15.5>>10.5>14.0>17.1>18.2>15.7>21.8 -- Regular diet -- Continue monitor ostomy output; 1.6L last 24h -- wound vac removed 9/28 for poor seal; continue WTD dressings; surgery may attempt replacement wound vac this week -- Ferrous sulfate 325 mg p.o. BID, FiberCon 625 mg p.o. BID, imodium 2mg  PO BID for high output ostomy -- CBC daily  Type 1 diabetes mellitus Hemoglobin A1c 5.7, well-controlled.  Home regimen includes Semglee 12 units [place] BID and NovoLog SSI. -- Semglee 5 units [place] daily -- SSI for coverage -- CBGs qAC/HS  Peripheral vascular disease s/p bilateral BKA -- Continue to hold home statin for now  ESRD on HD MWF Patient with clotted AVF.  IR placement of temporary dialysis catheter on 9/27. -- Nephrology following for continue HD while inpatient, appreciate assistance -- Further management of AVF per nephrology/vascular surgery/IR  Essential hypertension Home regimen includes isosorbide mononitrate 30 mg p.o. daily. -- Continue to hold given borderline hypotension   Hyperlipidemia -- Hold home atorvastatin 40 mg p.o. daily  Anemia of chronic medical/renal disease -- Hgb 10.1>>11.4>>7.4>9.0>7.4>7.1>7.9>7.3 -- Transfuse for hemoglobin less than 7.0 -- CBC daily  Goals of care On 05/08/2023, patient discussed with multiple medical providers and staff that she did not want to continue with aggressive care to include hemodialysis in which she signed off early yesterday.  She wanted to focus more on comfort.  But as of 05/09/2023 she reports ""I want to live" and I will continue hemodialysis at this time.  Seen by palliative care with recommendations of full code, full scope of care at this time.    DVT prophylaxis:  1.9* 1.9*   No results for input(s): "LIPASE", "AMYLASE" in the last 168 hours. No results for input(s): "AMMONIA" in the last 168 hours. Coagulation Profile: No results for input(s): "INR", "PROTIME" in the last 168 hours. Cardiac Enzymes: No results for input(s): "CKTOTAL", "CKMB", "CKMBINDEX", "TROPONINI" in the last 168 hours. BNP (last 3 results) No results for input(s): "PROBNP" in the last 8760 hours. HbA1C: No results for input(s): "HGBA1C" in the last 72 hours. CBG: Recent Labs  Lab 05/11/23 0631 05/11/23 1102 05/11/23 1659 05/11/23 2107 05/12/23 0630   GLUCAP 85 91 110* 87 173*   Lipid Profile: No results for input(s): "CHOL", "HDL", "LDLCALC", "TRIG", "CHOLHDL", "LDLDIRECT" in the last 72 hours. Thyroid Function Tests: No results for input(s): "TSH", "T4TOTAL", "FREET4", "T3FREE", "THYROIDAB" in the last 72 hours. Anemia Panel: No results for input(s): "VITAMINB12", "FOLATE", "FERRITIN", "TIBC", "IRON", "RETICCTPCT" in the last 72 hours. Sepsis Labs: Recent Labs  Lab 05/10/23 1932 05/12/23 0354  PROCALCITON  --  4.89  LATICACIDVEN 1.5  --     Recent Results (from the past 240 hour(s))  Culture, blood (Routine X 2) w Reflex to ID Panel     Status: None   Collection Time: 05/03/23  1:02 PM   Specimen: BLOOD LEFT HAND  Result Value Ref Range Status   Specimen Description BLOOD LEFT HAND  Final   Special Requests Blood Culture adequate volume AEROBIC BOTTLE ONLY  Final   Culture   Final    NO GROWTH 5 DAYS Performed at [facility], [address] [place], [place] [address]    Report Status 05/08/2023 FINAL  Final  Culture, blood (Routine X 2) w Reflex to ID Panel     Status: None   Collection Time: 05/03/23  1:05 PM   Specimen: BLOOD LEFT HAND  Result Value Ref Range Status   Specimen Description BLOOD LEFT HAND  Final   Special Requests   Final    AEROBIC BOTTLE ONLY Blood Culture results may not be optimal due to an inadequate volume of blood received in culture bottles   Culture   Final    NO GROWTH 5 DAYS Performed at [facility], [address], [place], [place] [address]    Report Status 05/08/2023 FINAL  Final         Radiology Studies: VAS US DUPLEX DIALYSIS ACCESS (AVF, AVG)  Result Date: 05/11/2023 DIALYSIS ACCESS Patient Name:  [name]  Date of Exam:   05/11/2023 Medical Rec #: [id number]      Accession #:    [id number] Date of Birth: [birth year]       Patient Gender: F Patient Age:   24 years Exam Location:  [facility] Procedure:      VAS US DUPLEX DIALYSIS ACCESS (AVF, AVG)  Referring Phys: Crista Elliot --------------------------------------------------------------------------------  Reason for Exam: Unable to dialyze through AVF/AVG. Access Site: Right Upper Extremity. Access Type: Brachial-cephalic AVF. History: HX of RUE AVF revision 10/23/21 (aneurysm complication & ulcerated          skin). Limitations: Calcification Comparison Study: Prior right fistula duplex done 03/17/23 Performing Technologist: Sherren Kerns RVS  Examination Guidelines: A complete evaluation includes B-mode imaging, spectral Doppler, color Doppler, and power Doppler as needed of all accessible portions of each vessel. Unilateral testing is considered an integral part of a complete examination. Limited examinations for reoccurring indications may be performed as noted.  +------------+----------+-------------+----------+--------+ OUTFLOW VEINPSV (cm/s)Diameter (cm)Depth (cm)Describe +------------+----------+-------------+----------+--------+ Clavicle  1.9* 1.9*   No results for input(s): "LIPASE", "AMYLASE" in the last 168 hours. No results for input(s): "AMMONIA" in the last 168 hours. Coagulation Profile: No results for input(s): "INR", "PROTIME" in the last 168 hours. Cardiac Enzymes: No results for input(s): "CKTOTAL", "CKMB", "CKMBINDEX", "TROPONINI" in the last 168 hours. BNP (last 3 results) No results for input(s): "PROBNP" in the last 8760 hours. HbA1C: No results for input(s): "HGBA1C" in the last 72 hours. CBG: Recent Labs  Lab 05/11/23 0631 05/11/23 1102 05/11/23 1659 05/11/23 2107 05/12/23 0630   GLUCAP 85 91 110* 87 173*   Lipid Profile: No results for input(s): "CHOL", "HDL", "LDLCALC", "TRIG", "CHOLHDL", "LDLDIRECT" in the last 72 hours. Thyroid Function Tests: No results for input(s): "TSH", "T4TOTAL", "FREET4", "T3FREE", "THYROIDAB" in the last 72 hours. Anemia Panel: No results for input(s): "VITAMINB12", "FOLATE", "FERRITIN", "TIBC", "IRON", "RETICCTPCT" in the last 72 hours. Sepsis Labs: Recent Labs  Lab 05/10/23 1932 05/12/23 0354  PROCALCITON  --  4.89  LATICACIDVEN 1.5  --     Recent Results (from the past 240 hour(s))  Culture, blood (Routine X 2) w Reflex to ID Panel     Status: None   Collection Time: 05/03/23  1:02 PM   Specimen: BLOOD LEFT HAND  Result Value Ref Range Status   Specimen Description BLOOD LEFT HAND  Final   Special Requests Blood Culture adequate volume AEROBIC BOTTLE ONLY  Final   Culture   Final    NO GROWTH 5 DAYS Performed at [facility], [address] [place], [place] [address]    Report Status 05/08/2023 FINAL  Final  Culture, blood (Routine X 2) w Reflex to ID Panel     Status: None   Collection Time: 05/03/23  1:05 PM   Specimen: BLOOD LEFT HAND  Result Value Ref Range Status   Specimen Description BLOOD LEFT HAND  Final   Special Requests   Final    AEROBIC BOTTLE ONLY Blood Culture results may not be optimal due to an inadequate volume of blood received in culture bottles   Culture   Final    NO GROWTH 5 DAYS Performed at [facility], [address], [place], [place] [address]    Report Status 05/08/2023 FINAL  Final         Radiology Studies: VAS US DUPLEX DIALYSIS ACCESS (AVF, AVG)  Result Date: 05/11/2023 DIALYSIS ACCESS Patient Name:  [name]  Date of Exam:   05/11/2023 Medical Rec #: [id number]      Accession #:    [id number] Date of Birth: [birth year]       Patient Gender: F Patient Age:   24 years Exam Location:  [facility] Procedure:      VAS US DUPLEX DIALYSIS ACCESS (AVF, AVG)  Referring Phys: Crista Elliot --------------------------------------------------------------------------------  Reason for Exam: Unable to dialyze through AVF/AVG. Access Site: Right Upper Extremity. Access Type: Brachial-cephalic AVF. History: HX of RUE AVF revision 10/23/21 (aneurysm complication & ulcerated          skin). Limitations: Calcification Comparison Study: Prior right fistula duplex done 03/17/23 Performing Technologist: Sherren Kerns RVS  Examination Guidelines: A complete evaluation includes B-mode imaging, spectral Doppler, color Doppler, and power Doppler as needed of all accessible portions of each vessel. Unilateral testing is considered an integral part of a complete examination. Limited examinations for reoccurring indications may be performed as noted.  +------------+----------+-------------+----------+--------+ OUTFLOW VEINPSV (cm/s)Diameter (cm)Depth (cm)Describe +------------+----------+-------------+----------+--------+ Clavicle  1.9* 1.9*   No results for input(s): "LIPASE", "AMYLASE" in the last 168 hours. No results for input(s): "AMMONIA" in the last 168 hours. Coagulation Profile: No results for input(s): "INR", "PROTIME" in the last 168 hours. Cardiac Enzymes: No results for input(s): "CKTOTAL", "CKMB", "CKMBINDEX", "TROPONINI" in the last 168 hours. BNP (last 3 results) No results for input(s): "PROBNP" in the last 8760 hours. HbA1C: No results for input(s): "HGBA1C" in the last 72 hours. CBG: Recent Labs  Lab 05/11/23 0631 05/11/23 1102 05/11/23 1659 05/11/23 2107 05/12/23 0630   GLUCAP 85 91 110* 87 173*   Lipid Profile: No results for input(s): "CHOL", "HDL", "LDLCALC", "TRIG", "CHOLHDL", "LDLDIRECT" in the last 72 hours. Thyroid Function Tests: No results for input(s): "TSH", "T4TOTAL", "FREET4", "T3FREE", "THYROIDAB" in the last 72 hours. Anemia Panel: No results for input(s): "VITAMINB12", "FOLATE", "FERRITIN", "TIBC", "IRON", "RETICCTPCT" in the last 72 hours. Sepsis Labs: Recent Labs  Lab 05/10/23 1932 05/12/23 0354  PROCALCITON  --  4.89  LATICACIDVEN 1.5  --     Recent Results (from the past 240 hour(s))  Culture, blood (Routine X 2) w Reflex to ID Panel     Status: None   Collection Time: 05/03/23  1:02 PM   Specimen: BLOOD LEFT HAND  Result Value Ref Range Status   Specimen Description BLOOD LEFT HAND  Final   Special Requests Blood Culture adequate volume AEROBIC BOTTLE ONLY  Final   Culture   Final    NO GROWTH 5 DAYS Performed at [facility], [address] [place], [place] [address]    Report Status 05/08/2023 FINAL  Final  Culture, blood (Routine X 2) w Reflex to ID Panel     Status: None   Collection Time: 05/03/23  1:05 PM   Specimen: BLOOD LEFT HAND  Result Value Ref Range Status   Specimen Description BLOOD LEFT HAND  Final   Special Requests   Final    AEROBIC BOTTLE ONLY Blood Culture results may not be optimal due to an inadequate volume of blood received in culture bottles   Culture   Final    NO GROWTH 5 DAYS Performed at [facility], [address], [place], [place] [address]    Report Status 05/08/2023 FINAL  Final         Radiology Studies: VAS US DUPLEX DIALYSIS ACCESS (AVF, AVG)  Result Date: 05/11/2023 DIALYSIS ACCESS Patient Name:  [name]  Date of Exam:   05/11/2023 Medical Rec #: [id number]      Accession #:    [id number] Date of Birth: [birth year]       Patient Gender: F Patient Age:   24 years Exam Location:  [facility] Procedure:      VAS US DUPLEX DIALYSIS ACCESS (AVF, AVG)  Referring Phys: Crista Elliot --------------------------------------------------------------------------------  Reason for Exam: Unable to dialyze through AVF/AVG. Access Site: Right Upper Extremity. Access Type: Brachial-cephalic AVF. History: HX of RUE AVF revision 10/23/21 (aneurysm complication & ulcerated          skin). Limitations: Calcification Comparison Study: Prior right fistula duplex done 03/17/23 Performing Technologist: Sherren Kerns RVS  Examination Guidelines: A complete evaluation includes B-mode imaging, spectral Doppler, color Doppler, and power Doppler as needed of all accessible portions of each vessel. Unilateral testing is considered an integral part of a complete examination. Limited examinations for reoccurring indications may be performed as noted.  +------------+----------+-------------+----------+--------+ OUTFLOW VEINPSV (cm/s)Diameter (cm)Depth (cm)Describe +------------+----------+-------------+----------+--------+ Clavicle  PROGRESS NOTE    [name]  ZOX:[id number] DOB: 04/29/1981 DOA: 04/30/2023 PCP: [facility], [facility]    Brief Narrative:   [name] is a 42 y.o. 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  Onset day prior to admission associated with diarrhea.  In the ED, patient was febrile, tachycardic with lactic acid 1.9 followed by 2.5.  Chest x-ray with findings of atelectasis versus infiltrate.  CT abdomen/pelvis with contrast shows evidence of a small bowel obstruction with dilated ileal segment up to 4 cm, sigmoid segment consistent with colitis, diffuse gastric wall thickening, cholelithiasis with mild chronic prominence of the common bile duct.  General surgery was consulted.  TRH consulted for admission for further evaluation and management of small bowel obstruction.   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  Patient's symptoms continue to progress despite IV antibiotics and conservative measures.  C. difficile antigen positive, toxin negative.  GI PCR panel negative.  Patient underwent diagnostic laparoscopy with extended ileus cecotomy and end ileostomy on 05/04/2023 by Dr. Hillery Hunter.  Pathology notable for ischemia of ileum/cecum, acute appendicitis. Repeat CT abdomen/pelvis 9/26 with residual small bowel thickening consistent with enteritis secondary to infection versus  inflammation versus ischemia, small gas/fluid anterior abdomen extending inferiorly without rim enhancement.  Completed course of 5 antibiotics with ceftriaxone and metronidazole on 9/28. -- General Surgery following, appreciate assistance -- WBC 9.7>>15.5>>10.5>14.0>17.1>18.2>15.7>21.8 -- Regular diet -- Continue monitor ostomy output; 1.6L last 24h -- wound vac removed 9/28 for poor seal; continue WTD dressings; surgery may attempt replacement wound vac this week -- Ferrous sulfate 325 mg p.o. BID, FiberCon 625 mg p.o. BID, imodium 2mg  PO BID for high output ostomy -- CBC daily  Type 1 diabetes mellitus Hemoglobin A1c 5.7, well-controlled.  Home regimen includes Semglee 12 units [place] BID and NovoLog SSI. -- Semglee 5 units [place] daily -- SSI for coverage -- CBGs qAC/HS  Peripheral vascular disease s/p bilateral BKA -- Continue to hold home statin for now  ESRD on HD MWF Patient with clotted AVF.  IR placement of temporary dialysis catheter on 9/27. -- Nephrology following for continue HD while inpatient, appreciate assistance -- Further management of AVF per nephrology/vascular surgery/IR  Essential hypertension Home regimen includes isosorbide mononitrate 30 mg p.o. daily. -- Continue to hold given borderline hypotension   Hyperlipidemia -- Hold home atorvastatin 40 mg p.o. daily  Anemia of chronic medical/renal disease -- Hgb 10.1>>11.4>>7.4>9.0>7.4>7.1>7.9>7.3 -- Transfuse for hemoglobin less than 7.0 -- CBC daily  Goals of care On 05/08/2023, patient discussed with multiple medical providers and staff that she did not want to continue with aggressive care to include hemodialysis in which she signed off early yesterday.  She wanted to focus more on comfort.  But as of 05/09/2023 she reports ""I want to live" and I will continue hemodialysis at this time.  Seen by palliative care with recommendations of full code, full scope of care at this time.    DVT prophylaxis:  1.9* 1.9*   No results for input(s): "LIPASE", "AMYLASE" in the last 168 hours. No results for input(s): "AMMONIA" in the last 168 hours. Coagulation Profile: No results for input(s): "INR", "PROTIME" in the last 168 hours. Cardiac Enzymes: No results for input(s): "CKTOTAL", "CKMB", "CKMBINDEX", "TROPONINI" in the last 168 hours. BNP (last 3 results) No results for input(s): "PROBNP" in the last 8760 hours. HbA1C: No results for input(s): "HGBA1C" in the last 72 hours. CBG: Recent Labs  Lab 05/11/23 0631 05/11/23 1102 05/11/23 1659 05/11/23 2107 05/12/23 0630   GLUCAP 85 91 110* 87 173*   Lipid Profile: No results for input(s): "CHOL", "HDL", "LDLCALC", "TRIG", "CHOLHDL", "LDLDIRECT" in the last 72 hours. Thyroid Function Tests: No results for input(s): "TSH", "T4TOTAL", "FREET4", "T3FREE", "THYROIDAB" in the last 72 hours. Anemia Panel: No results for input(s): "VITAMINB12", "FOLATE", "FERRITIN", "TIBC", "IRON", "RETICCTPCT" in the last 72 hours. Sepsis Labs: Recent Labs  Lab 05/10/23 1932 05/12/23 0354  PROCALCITON  --  4.89  LATICACIDVEN 1.5  --     Recent Results (from the past 240 hour(s))  Culture, blood (Routine X 2) w Reflex to ID Panel     Status: None   Collection Time: 05/03/23  1:02 PM   Specimen: BLOOD LEFT HAND  Result Value Ref Range Status   Specimen Description BLOOD LEFT HAND  Final   Special Requests Blood Culture adequate volume AEROBIC BOTTLE ONLY  Final   Culture   Final    NO GROWTH 5 DAYS Performed at [facility], [address] [place], [place] [address]    Report Status 05/08/2023 FINAL  Final  Culture, blood (Routine X 2) w Reflex to ID Panel     Status: None   Collection Time: 05/03/23  1:05 PM   Specimen: BLOOD LEFT HAND  Result Value Ref Range Status   Specimen Description BLOOD LEFT HAND  Final   Special Requests   Final    AEROBIC BOTTLE ONLY Blood Culture results may not be optimal due to an inadequate volume of blood received in culture bottles   Culture   Final    NO GROWTH 5 DAYS Performed at [facility], [address], [place], [place] [address]    Report Status 05/08/2023 FINAL  Final         Radiology Studies: VAS US DUPLEX DIALYSIS ACCESS (AVF, AVG)  Result Date: 05/11/2023 DIALYSIS ACCESS Patient Name:  [name]  Date of Exam:   05/11/2023 Medical Rec #: [id number]      Accession #:    [id number] Date of Birth: [birth year]       Patient Gender: F Patient Age:   24 years Exam Location:  [facility] Procedure:      VAS US DUPLEX DIALYSIS ACCESS (AVF, AVG)  Referring Phys: Crista Elliot --------------------------------------------------------------------------------  Reason for Exam: Unable to dialyze through AVF/AVG. Access Site: Right Upper Extremity. Access Type: Brachial-cephalic AVF. History: HX of RUE AVF revision 10/23/21 (aneurysm complication & ulcerated          skin). Limitations: Calcification Comparison Study: Prior right fistula duplex done 03/17/23 Performing Technologist: Sherren Kerns RVS  Examination Guidelines: A complete evaluation includes B-mode imaging, spectral Doppler, color Doppler, and power Doppler as needed of all accessible portions of each vessel. Unilateral testing is considered an integral part of a complete examination. Limited examinations for reoccurring indications may be performed as noted.  +------------+----------+-------------+----------+--------+ OUTFLOW VEINPSV (cm/s)Diameter (cm)Depth (cm)Describe +------------+----------+-------------+----------+--------+ Clavicle

## 2023-05-12 NOTE — Progress Notes (Addendum)
Patient ID: [name], female   DOB: 1981/07/16, 42 y.o.   MRN: [id number] [facility] Surgery Progress Note  8 Days Post-Op  Subjective: CC-  No acute events.  Issues with wound VAC prompting transition to wet-to-dry dressings yesterday.  Patient denies any abdominal pain  Objective: Vital signs in last 24 hours: Temp:  [98.2 F (36.8 C)-98.6 F (37 C)] 98.6 F (37 C) (09/29 0408) Pulse Rate:  [86] 86 (09/28 2239) Resp:  [15-20] 16 (09/29 0408) BP: (84-107)/(52-73) 92/62 (09/29 0408) SpO2:  [97 %-98 %] 98 % (09/29 0408) Weight:  [52 kg] 52 kg (09/29 0532) Last BM Date : 05/11/23  Intake/Output from previous day: 09/28 0701 - 09/29 0700 In: 1654.9 [I.V.:847.9; IV Piggyback:807.1] Out: 1600 [Stool:1600] Intake/Output this shift: Total I/O In: 697.4 [I.V.:397.4; IV Piggyback:300] Out: 1600 [Stool:1600]  PE: Gen:  Alert, NAD Abd: soft, nondistended, nontender this morning.  Dressing to midline wound clean and dry, ostomy is pink and viable with pudding consistency stool in the bag  Lab Results:  Recent Labs    05/11/23 0232 05/12/23 0354  WBC 15.7* 21.8*  HGB 7.9* 7.3*  HCT 25.1* 24.1*  PLT 426* 420*   BMET Recent Labs    05/10/23 1950 05/11/23 0232  NA 135 134*  K 4.5 3.5  CL 97* 95*  CO2 22 28  GLUCOSE 107* 80  BUN 41* 14  CREATININE 6.74* 3.11*  CALCIUM 8.5* 8.3*   PT/INR No results for input(s): "LABPROT", "INR" in the last 72 hours. CMP     Component Value Date/Time   NA 134 (L) 05/11/2023 0232   NA 133 (L) 05/18/2020 0927   K 3.5 05/11/2023 0232   CL 95 (L) 05/11/2023 0232   CO2 28 05/11/2023 0232   GLUCOSE 80 05/11/2023 0232   BUN 14 05/11/2023 0232   BUN 45 (H) 05/18/2020 0927   CREATININE 3.11 (H) 05/11/2023 0232   CALCIUM 8.3 (L) 05/11/2023 0232   CALCIUM 12.1 (H) 11/13/2021 2026   PROT 6.2 (L) 05/01/2023 1020   PROT 7.6 05/18/2020 0927   ALBUMIN 1.9 (L) 05/11/2023 0232   ALBUMIN 3.9 05/18/2020 0927   AST 26 05/01/2023 1020    ALT 18 05/01/2023 1020   ALKPHOS 72 05/01/2023 1020   BILITOT 0.9 05/01/2023 1020   BILITOT <0.2 05/18/2020 0927   GFRNONAA 19 (L) 05/11/2023 0232   GFRAA 6 (L) 05/18/2020 0927   Lipase     Component Value Date/Time   LIPASE 40 12/20/2022 0918       Studies/Results: VAS US DUPLEX DIALYSIS ACCESS (AVF, AVG)  Result Date: 05/11/2023 DIALYSIS ACCESS Patient Name:  [name]  Date of Exam:   05/11/2023 Medical Rec #: [id number]      Accession #:    [id number] Date of Birth: 04-08-1981       Patient Gender: F Patient Age:   65 years Exam Location:  [facility] Procedure:      VAS US DUPLEX DIALYSIS ACCESS (AVF, AVG) Referring Phys: Crista Elliot --------------------------------------------------------------------------------  Reason for Exam: Unable to dialyze through AVF/AVG. Access Site: Right Upper Extremity. Access Type: Brachial-cephalic AVF. History: HX of RUE AVF revision 10/23/21 (aneurysm complication & ulcerated          skin). Limitations: Calcification Comparison Study: Prior right fistula duplex done 03/17/23 Performing Technologist: Sherren Kerns RVS  Examination Guidelines: A complete evaluation includes B-mode imaging, spectral Doppler, color Doppler, and power Doppler as needed of all accessible portions of each vessel. Unilateral testing  is considered an integral part of a complete examination. Limited examinations for reoccurring indications may be performed as noted.  +------------+----------+-------------+----------+--------+ OUTFLOW VEINPSV (cm/s)Diameter (cm)Depth (cm)Describe +------------+----------+-------------+----------+--------+ Clavicle       255                                    +------------+----------+-------------+----------+--------+ Shoulder        57                                    +------------+----------+-------------+----------+--------+ Prox UA         56                                     +------------+----------+-------------+----------+--------+ Mid UA          82                                    +------------+----------+-------------+----------+--------+ Dist UA        101                                    +------------+----------+-------------+----------+--------+ AC Fossa       123                                    +------------+----------+-------------+----------+--------+ Prox Forearm   172                                    +------------+----------+-------------+----------+--------+ Thrombus noted mid to proximal upper arm  Summary: Arteriovenous fistula-Velocities less than 100cm/s noted throughout the upper arm. Arteriovenous fistula-Thrombus noted mid to proximal upper arm. *See table(s) above for measurements and observations.      --------------------------------------------------------------------------------   Preliminary    IR Fluoro Guide CV Line Left  Result Date: 05/10/2023 INDICATION: ESRD on HD.  Malfunctioning upper extremity AV fistula versus graft. EXAM: NON-TUNNELED CENTRAL VENOUS HEMODIALYSIS CATHETER PLACEMENT WITH ULTRASOUND AND FLUOROSCOPIC GUIDANCE COMPARISON:  Chest XR, 04/30/2023.  CTA head and neck, 09/22/2021. MEDICATIONS: Local anesthetic was administered.  10 mL lidocaine 1% FLUOROSCOPY TIME:  Fluoroscopic dose; 1 mGy COMPLICATIONS: None immediate. PROCEDURE: Informed written consent was obtained from the patient and/or patient's representative after a discussion of the risks, benefits, and alternatives to treatment. Questions regarding the procedure were encouraged and answered. The LEFT neck and chest were prepped with chlorhexidine in a sterile fashion, and a sterile drape was applied covering the operative field. Maximum barrier sterile technique with sterile gowns and gloves were used for the procedure. A timeout was performed prior to the initiation of the procedure. After the overlying soft tissues were anesthetized, a small  venotomy incision was created and a micropuncture kit was utilized to access the internal jugular vein. Real-time ultrasound guidance was utilized for vascular access including the acquisition of a permanent ultrasound image documenting patency of the accessed vessel. The microwire was utilized to measure appropriate catheter length. A stiff glidewire was advanced to the level of  the IVC. Under fluoroscopic guidance, the venotomy was serially dilated, ultimately allowing placement of a 20 cm temporary Trialysis catheter with tip ultimately terminating within the superior aspect of the right atrium. Final catheter positioning was confirmed and documented with a spot radiographic image. The catheter aspirates and flushes normally. The catheter was flushed with appropriate volume heparin dwells. The catheter exit site was secured with a 0-Prolene retention suture. A dressing was placed. The patient tolerated the procedure well without immediate post procedural complication. IMPRESSION: Successful placement of a LEFT internal jugular approach 20 cm non tunneled/temporary dialysis catheter The tip of the catheter is positioned within the proximal RIGHT atrium. The catheter is ready for immediate use. PLAN: This catheter may be converted to a tunneled dialysis catheter at a later date as indicated. Roanna Banning, MD [facility] Radiology Electronically Signed   By: Roanna Banning M.D.   On: 05/10/2023 17:07   IR US Guide Vasc Access Left  Result Date: 05/10/2023 INDICATION: ESRD on HD.  Malfunctioning upper extremity AV fistula versus graft. EXAM: NON-TUNNELED CENTRAL VENOUS HEMODIALYSIS CATHETER PLACEMENT WITH ULTRASOUND AND FLUOROSCOPIC GUIDANCE COMPARISON:  Chest XR, 04/30/2023.  CTA head and neck, 09/22/2021. MEDICATIONS: Local anesthetic was administered.  10 mL lidocaine 1% FLUOROSCOPY TIME:  Fluoroscopic dose; 1 mGy COMPLICATIONS: None immediate. PROCEDURE: Informed  written consent was obtained from the patient and/or patient's representative after a discussion of the risks, benefits, and alternatives to treatment. Questions regarding the procedure were encouraged and answered. The LEFT neck and chest were prepped with chlorhexidine in a sterile fashion, and a sterile drape was applied covering the operative field. Maximum barrier sterile technique with sterile gowns and gloves were used for the procedure. A timeout was performed prior to the initiation of the procedure. After the overlying soft tissues were anesthetized, a small venotomy incision was created and a micropuncture kit was utilized to access the internal jugular vein. Real-time ultrasound guidance was utilized for vascular access including the acquisition of a permanent ultrasound image documenting patency of the accessed vessel. The microwire was utilized to measure appropriate catheter length. A stiff glidewire was advanced to the level of the IVC. Under fluoroscopic guidance, the venotomy was serially dilated, ultimately allowing placement of a 20 cm temporary Trialysis catheter with tip ultimately terminating within the superior aspect of the right atrium. Final catheter positioning was confirmed and documented with a spot radiographic image. The catheter aspirates and flushes normally. The catheter was flushed with appropriate volume heparin dwells. The catheter exit site was secured with a 0-Prolene retention suture. A dressing was placed. The patient tolerated the procedure well without immediate post procedural complication. IMPRESSION: Successful placement of a LEFT internal jugular approach 20 cm non tunneled/temporary dialysis catheter The tip of the catheter is positioned within the proximal RIGHT atrium. The catheter is ready for immediate use. PLAN: This catheter may be converted to a tunneled dialysis catheter at a later date as indicated. Roanna Banning, MD [facility]  Special[facility] Radiology Electronically Signed   By: Roanna Banning M.D.   On: 05/10/2023 17:07    Anti-infectives: Anti-infectives (From admission, onward)    Start     Dose/Rate Route Frequency Ordered Stop   05/08/23 2200  cefTRIAXone (ROCEPHIN) 2 g in sodium chloride 0.9 % 100 mL IVPB        2 g 200 mL/hr over 30 Minutes Intravenous Every 24 hours 05/08/23 1613 05/11/23 2232   05/08/23 2200  metroNIDAZOLE (FLAGYL)  IVPB 500 mg        500 mg 100 mL/hr over 60 Minutes Intravenous Every 12 hours 05/08/23 1613 05/11/23 2346   05/04/23 0200  cefTRIAXone (ROCEPHIN) 2 g in sodium chloride 0.9 % 100 mL IVPB        2 g 200 mL/hr over 30 Minutes Intravenous Every 24 hours 05/04/23 0102 05/08/23 0144   05/04/23 0200  metroNIDAZOLE (FLAGYL) IVPB 500 mg        500 mg 100 mL/hr over 60 Minutes Intravenous Every 12 hours 05/04/23 0102 05/09/23 1203   04/30/23 1800  ciprofloxacin (CIPRO) IVPB 400 mg  Status:  Discontinued        400 mg 200 mL/hr over 60 Minutes Intravenous Every 24 hours 04/30/23 1443 05/02/23 1140   04/30/23 1515  metroNIDAZOLE (FLAGYL) IVPB 500 mg  Status:  Discontinued        500 mg 100 mL/hr over 60 Minutes Intravenous Every 12 hours 04/30/23 1418 05/02/23 1140        Assessment/Plan 42 yo female presenting with abdominal pain, diarrhea, low grade fevers, dilated small bowel and colonic wall thickening on CT   POD#8 s/p diagnostic lap converted to ex-lap with extended ileo-cecectomy (mid-ileum to just past cecum), 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 Hillery Hunter - Intra-op findings were notable for adhesions in the RLQ w/ associated twisting of the mesentery resulting in SB ischemia. - CT 9/26 ordered due to leukocytosis but overall looks ok, some residual small bowel thickening but no large abscess. Will continue IV antibiotics. Patient clinically improving today with less abdominal pain, and she is tolerating more PO. WBC is back up this morning but clinically she is seems to be doing  well - WOC RN following for new ileostomy. High volume output (1600), continue BID iron and fiber.  Will add Imodium. - Vac applied 9/26, plan for first change 9/30- poor seal, continue damp to dry dressing changes and can reattempt VAC this coming week. - mobilize, continue therapies - rec SNF   FEN: reg, Glucerna ID: Rocephin/Flagyl 9/21 >> 9/28 VTE: SCDs, SQH q 8h Foley: out Dispo: progressive care per primary    LOS: 12 days    Berna Bue, MD [facility] Surgery 05/12/2023, 6:59 AM Please see Amion for pager number during day hours 7:00am-4:30pm

## 2023-05-12 NOTE — Progress Notes (Signed)
[name] NEPHROLOGY PROGRESS NOTE  OP HD orders. Dialysis Orders: [facility] MWF  3h  400/1.5  49kg   R AVF   -Heparin none - last OP HD 9/16, post wt 51kg, coming off 2- 4kg over the last 3 wks - mircera IV q 2 wks, last 9/13, due 9/27 - hectorol 4 mcg IV three times per week.   Assessment/ Plan: # Abdominal pain - partial SBO s/p ex-lap with extended ileo-cecectomy with end ileostomy. Per primary/Gen surgery.   #Sepsis 2/2 to ischemic Colitis - Management per primary.   # ESRD - on HD MWF.  Unable to cannulate AV fistula on 9/27because of clotting.  IR placed temporary HD catheter and received dialysis on 9/27.  Duplex US of AVF with low blood flow, vascular surgeon and IR were consulted.  Plan for AVF declotting in coming week, likely tomorrow.  #Hyperkalemia. Resolved.   # HTN/Volume - no vol excess on exam. UF wth HD.   # Anemia due to ESRD, continue ESA.  Monitor hemoglobin.  # CKD-MBD - sevelamer for hyperphosphatemia.  Cont IV vdra. Resume sensipar when eating.   #PAD - bilat AKA.   # GOC: Seen by palliative care team, patient would like to continue full scope of treatment at this time.  Subjective: Seen and examined at the bedside.  No new event.  Denies nausea, vomiting, chest pain or shortness of breath.  Objective Vital signs in last 24 hours: Vitals:   05/11/23 2239 05/12/23 0408 05/12/23 0532 05/12/23 0816  BP: 95/73 92/62  (!) 90/58  Pulse: 86   89  Resp: 16 16  17   Temp: 98.6 F (37 C) 98.6 F (37 C)  99.6 F (37.6 C)  TempSrc: Oral Oral  Axillary  SpO2: 97% 98%  98%  Weight:   52 kg    Weight change: -7 kg  Intake/Output Summary (Last 24 hours) at 05/12/2023 0950 Last data filed at 05/12/2023 0408 Gross per 24 hour  Intake 1654.93 ml  Output 1600 ml  Net 54.93 ml       Labs: RENAL PANEL Recent Labs  Lab 05/06/23 0239 05/08/23 0846 05/10/23 0824 05/10/23 1950 05/11/23 0232  NA 129* 130* 133* 135 134*  K 3.7  3.5 3.9 4.5 3.5  CL 94* 90* 95* 97* 95*  CO2 22 23 22 22 28   GLUCOSE 132* 87 114* 107* 80  BUN 51* 38* 40* 41* 14  CREATININE 4.78* 4.76* 6.23* 6.74* 3.11*  CALCIUM 7.7* 7.9* 8.5* 8.5* 8.3*  PHOS 4.3 5.2* 6.3* 7.0* 3.2  ALBUMIN 1.8* 1.6* 1.8* 1.9* 1.9*    Liver Function Tests: Recent Labs  Lab 05/10/23 0824 05/10/23 1950 05/11/23 0232  ALBUMIN 1.8* 1.9* 1.9*   No results for input(s): "LIPASE", "AMYLASE" in the last 168 hours. No results for input(s): "AMMONIA" in the last 168 hours. CBC: Recent Labs    07/25/22 0532 07/26/22 0654 05/08/23 0846 05/09/23 0217 05/10/23 0824 05/11/23 0232 05/12/23 0354  HGB 9.8*   < > 7.4* 8.1* 7.1* 7.9* 7.3*  MCV 85.3   < > 82.9 82.6 83.4 81.5 83.4  FERRITIN 562*  --   --   --   --   --   --   TIBC 179*  --   --   --   --   --   --   IRON 56  --   --   --   --   --   --    < > =  values in this interval not displayed.    Cardiac Enzymes: No results for input(s): "CKTOTAL", "CKMB", "CKMBINDEX", "TROPONINI" in the last 168 hours. CBG: Recent Labs  Lab 05/11/23 0631 05/11/23 1102 05/11/23 1659 05/11/23 2107 05/12/23 0630  GLUCAP 85 91 110* 87 173*    Iron Studies: No results for input(s): "IRON", "TIBC", "TRANSFERRIN", "FERRITIN" in the last 72 hours. Studies/Results: VAS US DUPLEX DIALYSIS ACCESS (AVF, AVG)  Result Date: 05/11/2023 DIALYSIS ACCESS Patient Name:  [name]  Date of Exam:   05/11/2023 Medical Rec #: [id number]      Accession #:    [id number] Date of Birth: [birth year]       Patient Gender: F Patient Age:   42 years Exam Location:  [facility] Procedure:      VAS US DUPLEX DIALYSIS ACCESS (AVF, AVG) Referring Phys: Crista Elliot --------------------------------------------------------------------------------  Reason for Exam: Unable to dialyze through AVF/AVG. Access Site: Right Upper Extremity. Access Type: Brachial-cephalic AVF. History: HX of RUE AVF revision 10/23/21 (aneurysm complication & ulcerated           skin). Limitations: Calcification Comparison Study: Prior right fistula duplex done 03/17/23 Performing Technologist: Sherren Kerns RVS  Examination Guidelines: A complete evaluation includes B-mode imaging, spectral Doppler, color Doppler, and power Doppler as needed of all accessible portions of each vessel. Unilateral testing is considered an integral part of a complete examination. Limited examinations for reoccurring indications may be performed as noted.  +------------+----------+-------------+----------+--------+ OUTFLOW VEINPSV (cm/s)Diameter (cm)Depth (cm)Describe +------------+----------+-------------+----------+--------+ Clavicle       255                                    +------------+----------+-------------+----------+--------+ Shoulder        57                                    +------------+----------+-------------+----------+--------+ Prox UA         56                                    +------------+----------+-------------+----------+--------+ Mid UA          82                                    +------------+----------+-------------+----------+--------+ Dist UA        101                                    +------------+----------+-------------+----------+--------+ AC Fossa       123                                    +------------+----------+-------------+----------+--------+ Prox Forearm   172                                    +------------+----------+-------------+----------+--------+ Thrombus noted mid to proximal upper arm  Summary: Arteriovenous fistula-Velocities less than 100cm/s noted throughout the upper arm. Arteriovenous fistula-Thrombus noted mid to proximal upper arm. *  See table(s) above for measurements and observations.      --------------------------------------------------------------------------------   Preliminary    IR Fluoro Guide CV Line Left  Result Date: 05/10/2023 INDICATION: ESRD on HD.  Malfunctioning upper  extremity AV fistula versus graft. EXAM: NON-TUNNELED CENTRAL VENOUS HEMODIALYSIS CATHETER PLACEMENT WITH ULTRASOUND AND FLUOROSCOPIC GUIDANCE COMPARISON:  Chest XR, 04/30/2023.  CTA head and neck, 09/22/2021. MEDICATIONS: Local anesthetic was administered.  10 mL lidocaine 1% FLUOROSCOPY TIME:  Fluoroscopic dose; 1 mGy COMPLICATIONS: None immediate. PROCEDURE: Informed written consent was obtained from the patient and/or patient's representative after a discussion of the risks, benefits, and alternatives to treatment. Questions regarding the procedure were encouraged and answered. The LEFT neck and chest were prepped with chlorhexidine in a sterile fashion, and a sterile drape was applied covering the operative field. Maximum barrier sterile technique with sterile gowns and gloves were used for the procedure. A timeout was performed prior to the initiation of the procedure. After the overlying soft tissues were anesthetized, a small venotomy incision was created and a micropuncture kit was utilized to access the internal jugular vein. Real-time ultrasound guidance was utilized for vascular access including the acquisition of a permanent ultrasound image documenting patency of the accessed vessel. The microwire was utilized to measure appropriate catheter length. A stiff glidewire was advanced to the level of the IVC. Under fluoroscopic guidance, the venotomy was serially dilated, ultimately allowing placement of a 20 cm temporary Trialysis catheter with tip ultimately terminating within the superior aspect of the right atrium. Final catheter positioning was confirmed and documented with a spot radiographic image. The catheter aspirates and flushes normally. The catheter was flushed with appropriate volume heparin dwells. The catheter exit site was secured with a 0-Prolene retention suture. A dressing was placed. The patient tolerated the procedure well without immediate post procedural complication. IMPRESSION:  Successful placement of a LEFT internal jugular approach 20 cm non tunneled/temporary dialysis catheter The tip of the catheter is positioned within the proximal RIGHT atrium. The catheter is ready for immediate use. PLAN: This catheter may be converted to a tunneled dialysis catheter at a later date as indicated. Roanna Banning, MD [facility] Radiology Electronically Signed   By: Roanna Banning M.D.   On: 05/10/2023 17:07   IR US Guide Vasc Access Left  Result Date: 05/10/2023 INDICATION: ESRD on HD.  Malfunctioning upper extremity AV fistula versus graft. EXAM: NON-TUNNELED CENTRAL VENOUS HEMODIALYSIS CATHETER PLACEMENT WITH ULTRASOUND AND FLUOROSCOPIC GUIDANCE COMPARISON:  Chest XR, 04/30/2023.  CTA head and neck, 09/22/2021. MEDICATIONS: Local anesthetic was administered.  10 mL lidocaine 1% FLUOROSCOPY TIME:  Fluoroscopic dose; 1 mGy COMPLICATIONS: None immediate. PROCEDURE: Informed written consent was obtained from the patient and/or patient's representative after a discussion of the risks, benefits, and alternatives to treatment. Questions regarding the procedure were encouraged and answered. The LEFT neck and chest were prepped with chlorhexidine in a sterile fashion, and a sterile drape was applied covering the operative field. Maximum barrier sterile technique with sterile gowns and gloves were used for the procedure. A timeout was performed prior to the initiation of the procedure. After the overlying soft tissues were anesthetized, a small venotomy incision was created and a micropuncture kit was utilized to access the internal jugular vein. Real-time ultrasound guidance was utilized for vascular access including the acquisition of a permanent ultrasound image documenting patency of the accessed vessel. The microwire was utilized to measure appropriate catheter length. A stiff glidewire was advanced to the level  of the IVC. Under fluoroscopic guidance,  the venotomy was serially dilated, ultimately allowing placement of a 20 cm temporary Trialysis catheter with tip ultimately terminating within the superior aspect of the right atrium. Final catheter positioning was confirmed and documented with a spot radiographic image. The catheter aspirates and flushes normally. The catheter was flushed with appropriate volume heparin dwells. The catheter exit site was secured with a 0-Prolene retention suture. A dressing was placed. The patient tolerated the procedure well without immediate post procedural complication. IMPRESSION: Successful placement of a LEFT internal jugular approach 20 cm non tunneled/temporary dialysis catheter The tip of the catheter is positioned within the proximal RIGHT atrium. The catheter is ready for immediate use. PLAN: This catheter may be converted to a tunneled dialysis catheter at a later date as indicated. Roanna Banning, MD [facility] Inter[facility] Radiology Special[facility] Radiology Electronically Signed   By: Roanna Banning M.D.   On: 05/10/2023 17:07    Medications: Infusions:  anticoagulant sodium citrate     dextrose 5 % and 0.9 % NaCl 50 mL/hr at 05/12/23 0408    Scheduled Medications:  Chlorhexidine Gluconate Cloth  6 each Topical Q0600   Chlorhexidine Gluconate Cloth  6 each Topical Q0600   darbepoetin (ARANESP) injection - DIALYSIS  60 mcg Subcutaneous Q Sat-1800   doxercalciferol  4 mcg Intravenous Q M,W,F-HD   feeding supplement (GLUCERNA SHAKE)  237 mL Oral TID BM   ferrous sulfate  325 mg Oral BID WC   heparin  5,000 Units Subcutaneous Q8H   insulin aspart  0-6 Units Subcutaneous TID WC   insulin glargine-yfgn  5 Units Subcutaneous Daily   loperamide  2 mg Oral BID   pantoprazole  40 mg Oral QHS   polycarbophil  625 mg Oral BID   sevelamer carbonate  0.8 g Oral TID WC    have reviewed scheduled and prn medications.  Physical Exam: General: Not in distress.  Lying comfortable. Heart:RRR, s1s2  nl Lungs:clear b/l, no crackle Abdomen: Soft, nondistended Extremities: Bilateral BKA Dialysis Access: Right AV fistula has faint thrill  [name] 05/12/2023,9:50 AM  LOS: 12 days

## 2023-05-13 DIAGNOSIS — K56609 Unspecified intestinal obstruction, unspecified as to partial versus complete obstruction: Secondary | ICD-10-CM | POA: Diagnosis not present

## 2023-05-13 LAB — GLUCOSE, CAPILLARY
Glucose-Capillary: 78 mg/dL (ref 70–99)
Glucose-Capillary: 91 mg/dL (ref 70–99)
Glucose-Capillary: 220 mg/dL — ABNORMAL HIGH (ref 70–99)
Glucose-Capillary: 209 mg/dL — ABNORMAL HIGH (ref 70–99)
Glucose-Capillary: 175 mg/dL — ABNORMAL HIGH (ref 70–99)
Glucose-Capillary: 124 mg/dL — ABNORMAL HIGH (ref 70–99)
Glucose-Capillary: 84 mg/dL (ref 70–99)

## 2023-05-13 LAB — RENAL FUNCTION PANEL
Albumin: 1.7 g/dL — ABNORMAL LOW (ref 3.5–5.0)
Anion gap: 14 (ref 5–15)
BUN: 28 mg/dL — ABNORMAL HIGH (ref 6–20)
CO2: 23 mmol/L (ref 22–32)
Calcium: 8.4 mg/dL — ABNORMAL LOW (ref 8.9–10.3)
Chloride: 100 mmol/L (ref 98–111)
Creatinine, Ser: 6.02 mg/dL — ABNORMAL HIGH (ref 0.44–1.00)
GFR, Estimated: 8 mL/min — ABNORMAL LOW (ref >60)
Glucose, Bld: 173 mg/dL — ABNORMAL HIGH (ref 70–99)
Phosphorus: 5.7 mg/dL — ABNORMAL HIGH (ref 2.5–4.6)
Potassium: 4.1 mmol/L (ref 3.5–5.1)
Sodium: 137 mmol/L (ref 135–145)

## 2023-05-13 LAB — CBC
HCT: 25 % — ABNORMAL LOW (ref 36.0–46.0)
Hemoglobin: 7.6 g/dL — ABNORMAL LOW (ref 12.0–15.0)
MCH: 26.8 pg (ref 26.0–34.0)
MCHC: 30.4 g/dL (ref 30.0–36.0)
MCV: 88 fL (ref 80.0–100.0)
Platelets: 385 10*3/uL (ref 150–400)
RBC: 2.84 MIL/uL — ABNORMAL LOW (ref 3.87–5.11)
RDW: 20 % — ABNORMAL HIGH (ref 11.5–15.5)
WBC: 20.8 10*3/uL — ABNORMAL HIGH (ref 4.0–10.5)
nRBC: 0.1 % (ref 0.0–0.2)

## 2023-05-13 MED ORDER — CALCIUM POLYCARBOPHIL 625 MG PO TABS
1250.0000 mg | ORAL_TABLET | Freq: Two times a day (BID) | ORAL | Status: DC
  Administered 2023-05-13 – 2023-05-24 (×20): 1250 mg via ORAL
  Filled 2023-05-13 (×22): qty 2

## 2023-05-13 MED ORDER — DAKINS (1/4 STRENGTH) 0.125 % EX SOLN
Freq: Two times a day (BID) | CUTANEOUS | Status: AC
  Filled 2023-05-13: qty 473

## 2023-05-13 MED ORDER — LOPERAMIDE HCL 2 MG PO CAPS
2.0000 mg | ORAL_CAPSULE | Freq: Three times a day (TID) | ORAL | Status: DC
  Administered 2023-05-13 – 2023-05-19 (×15): 2 mg via ORAL
  Filled 2023-05-13 (×17): qty 1

## 2023-05-13 NOTE — Progress Notes (Signed)
[facility] NEPHROLOGY PROGRESS NOTE  Subjective: Seen and examined at the bedside.  No new event.  Denies nausea, vomiting, chest pain or shortness of breath.  Objective Vital signs in last 24 hours: Vitals:   05/13/23 0107 05/13/23 0549 05/13/23 0705 05/13/23 0730  BP: 103/66 98/63  92/60  Pulse: 89 89  78  Resp: 18 18  18   Temp: 98.2 F (36.8 C) 98.2 F (36.8 C)    TempSrc: Oral Oral    SpO2:      Weight:   19 kg    Physical Exam: General: Not in distress.  Lying comfortable. Heart:RRR, s1s2 nl Lungs:clear b/l, no crackle Abdomen: Soft, nondistended Extremities: Bilateral BKA Dialysis Access: Right AV fistula has faint thrill  OP HD: [facility] MWF  3h  400/1.5  49kg   R AVF   -Heparin none - last OP HD 9/16, post wt 51kg, coming off 2- 4kg over the last 3 wks - mircera IV q 2 wks, last 9/13, due 9/27 - hectorol 4 mcg IV three times per week.   Assessment/ Plan:  # Abdominal pain - partial SBO s/p ex-lap with extended ileo-cecectomy with end ileostomy. Per primary/Gen surgery.   #Sepsis 2/2 to ischemic Colitis - Management per primary.   # ESRD - on HD MWF.  Unable to cannulate AV fistula on 9/27 because of clotting.  IR placed temporary HD catheter and received dialysis on 9/27.  Duplex US of AVF with low blood flow, vascular surgeon and IR were consulted.  Plan for AVF declotting in coming week, likely tomorrow.  #Hyperkalemia. Resolved.   # HTN/Volume - no vol excess on exam. UF wth HD.   # Anemia due to ESRD, continue ESA.  Monitor hemoglobin.  # CKD-MBD - sevelamer for hyperphosphatemia.  Cont IV vdra. Resume sensipar when eating.   #PAD - bilat AKA.   # GOC: Seen by palliative care team, patient would like to continue full scope of treatment at this time.  Tracey Moselle  MD  [facility] 05/13/2023, 3:51 PM  Recent Labs  Lab 05/11/23 0232 05/12/23 0354 05/13/23 0807  HGB 7.9* 7.3* 7.6*  ALBUMIN 1.9*  --  1.7*  CALCIUM 8.3*  --  8.4*   PHOS 3.2  --  5.7*  CREATININE 3.11*  --  6.02*  K 3.5  --  4.1    Inpatient medications:  Chlorhexidine Gluconate Cloth  6 each Topical Q0600   Chlorhexidine Gluconate Cloth  6 each Topical Q0600   Chlorhexidine Gluconate Cloth  6 each Topical Q0600   [START ON 05/17/2023] darbepoetin (ARANESP) injection - DIALYSIS  60 mcg Subcutaneous Q Fri-1800   doxercalciferol  4 mcg Intravenous Q M,W,F-HD   feeding supplement (GLUCERNA SHAKE)  237 mL Oral TID BM   ferrous sulfate  325 mg Oral BID WC   heparin  5,000 Units Subcutaneous Q8H   insulin aspart  0-6 Units Subcutaneous TID WC   insulin glargine-yfgn  5 Units Subcutaneous Daily   loperamide  2 mg Oral TID   pantoprazole  40 mg Oral QHS   polycarbophil  1,250 mg Oral BID   sevelamer carbonate  0.8 g Oral TID WC   sodium hypochlorite   Irrigation BID    anticoagulant sodium citrate     acetaminophen **OR** acetaminophen, albuterol, alteplase, anticoagulant sodium citrate, heparin, hydrALAZINE, HYDROcodone-acetaminophen, HYDROmorphone (DILAUDID) injection, lidocaine (PF), lidocaine-prilocaine, ondansetron **OR** ondansetron (ZOFRAN) IV, pentafluoroprop-tetrafluoroeth

## 2023-05-13 NOTE — Consult Note (Signed)
WOC Nurse ostomy follow up Stoma type/location: RLQ ileostomy, was high output, today, hard balls noted in pouch.  Will not use high output pouch with spout today.  Stomal assessment/size:  1" pink and moist, flush with abdomen maceration to peristomal skin from 8 to 10 o'clock.  Skin prep and barrier ring used.  Peristomal assessment: see above Treatment options for stomal/peristomal skin:  barrier ring and 1 piece convex pouch  Output liquid brown stool with numerous hard balls noted in pouch.  Immodium has also been increased today. This would not pass through spout, I am placing her in a 1 piece convex today.  There are high output pouches at the bedside as well.   Ostomy pouching: 1pc.convex Education provided: patient in bed with eyes closed.  Answers questions, nonparticipative in care.  Enrolled patient in [place] Secure Start Discharge program: Yes Will follow.  Mike Gip MSN, RN, FNP-BC CWON Wound, Ostomy, Continence Nurse Outpatient [facility] [phone] Pager [phone]

## 2023-05-13 NOTE — Progress Notes (Addendum)
PROGRESS NOTE    [name]  WGN:[id number] DOB: [birth year] DOA: 04/30/2023 PCP: [facility]    Brief Narrative:   [name] is a 42 y.o. 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  Onset day prior to admission associated with diarrhea.  In the ED, patient was febrile, tachycardic with lactic acid 1.9 followed by 2.5.  Chest x-ray with findings of atelectasis versus infiltrate.  CT abdomen/pelvis with contrast shows evidence of a small bowel obstruction with dilated ileal segment up to 4 cm, sigmoid segment consistent with colitis, diffuse gastric wall thickening, cholelithiasis with mild chronic prominence of the common bile duct.  General surgery was consulted.  TRH consulted for admission for further evaluation and management of small bowel obstruction.   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  Patient's symptoms continue to progress despite IV antibiotics and conservative measures.  C. difficile antigen positive, toxin negative.  GI PCR panel negative.  Patient underwent diagnostic laparoscopy with extended ileus cecotomy and end ileostomy on 05/04/2023 by Dr. Hillery Hunter.  Pathology notable for ischemia of ileum/cecum, acute appendicitis. Repeat CT abdomen/pelvis 9/26 with residual small bowel thickening consistent with enteritis secondary to infection versus  inflammation versus ischemia, small gas/fluid anterior abdomen extending inferiorly without rim enhancement.  Completed course of 5 antibiotics with ceftriaxone and metronidazole on 9/28. -- General Surgery following, appreciate assistance -- WBC 9.7>>15.5>>10.5>14.0>17.1>18.2>15.7>21.8>20.8 -- Regular diet -- Continue monitor ostomy output; 1.6L last 24h -- wound vac removed 9/28 for poor seal; continue WTD dressings; surgery may attempt replacement wound vac this week -- Ferrous sulfate 325 mg p.o. BID, FiberCon 1250 mg p.o. BID, imodium 2mg  PO TID for high output ostomy  Type 1 diabetes mellitus Hemoglobin A1c 5.7, well-controlled.  Home regimen includes Semglee 12 units [place] BID and NovoLog SSI. -- Semglee 5 units [place] daily -- SSI for coverage -- CBGs qAC/HS  Peripheral vascular disease s/p bilateral BKA -- Continue to hold home statin for now  ESRD on HD MWF Patient with clotted AVF.  IR placement of temporary dialysis catheter on 9/27. -- Nephrology following for continue HD while inpatient, appreciate assistance -- Vascular surgery/IR following, plan IR fistulogram with likely intervention on 10/1 -- N.p.o. after midnight  Essential hypertension Home regimen includes isosorbide mononitrate 30 mg p.o. daily. -- Continue to hold given borderline hypotension   Hyperlipidemia -- Hold home atorvastatin 40 mg p.o. daily  Anemia of chronic medical/renal disease -- Hgb 10.1>>11.4>>7.4>9.0>7.4>7.1>7.9>7.3>7.6 -- Transfuse for hemoglobin less than 7.0 -- CBC daily  Goals of care On 05/08/2023, patient discussed with multiple medical providers and staff that she did not want to continue with aggressive care to include hemodialysis in which she signed off early yesterday.  She wanted to focus more on comfort.  But as of 05/09/2023 she reports ""I want to live" and I will continue hemodialysis at this time.  Seen by palliative care with recommendations of full code, full scope of care at  this  PROGRESS NOTE    [name]  WGN:[id number] DOB: [birth year] DOA: 04/30/2023 PCP: [facility]    Brief Narrative:   [name] is a 42 y.o. 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  Onset day prior to admission associated with diarrhea.  In the ED, patient was febrile, tachycardic with lactic acid 1.9 followed by 2.5.  Chest x-ray with findings of atelectasis versus infiltrate.  CT abdomen/pelvis with contrast shows evidence of a small bowel obstruction with dilated ileal segment up to 4 cm, sigmoid segment consistent with colitis, diffuse gastric wall thickening, cholelithiasis with mild chronic prominence of the common bile duct.  General surgery was consulted.  TRH consulted for admission for further evaluation and management of small bowel obstruction.   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  Patient's symptoms continue to progress despite IV antibiotics and conservative measures.  C. difficile antigen positive, toxin negative.  GI PCR panel negative.  Patient underwent diagnostic laparoscopy with extended ileus cecotomy and end ileostomy on 05/04/2023 by Dr. Hillery Hunter.  Pathology notable for ischemia of ileum/cecum, acute appendicitis. Repeat CT abdomen/pelvis 9/26 with residual small bowel thickening consistent with enteritis secondary to infection versus  inflammation versus ischemia, small gas/fluid anterior abdomen extending inferiorly without rim enhancement.  Completed course of 5 antibiotics with ceftriaxone and metronidazole on 9/28. -- General Surgery following, appreciate assistance -- WBC 9.7>>15.5>>10.5>14.0>17.1>18.2>15.7>21.8>20.8 -- Regular diet -- Continue monitor ostomy output; 1.6L last 24h -- wound vac removed 9/28 for poor seal; continue WTD dressings; surgery may attempt replacement wound vac this week -- Ferrous sulfate 325 mg p.o. BID, FiberCon 1250 mg p.o. BID, imodium 2mg  PO TID for high output ostomy  Type 1 diabetes mellitus Hemoglobin A1c 5.7, well-controlled.  Home regimen includes Semglee 12 units [place] BID and NovoLog SSI. -- Semglee 5 units [place] daily -- SSI for coverage -- CBGs qAC/HS  Peripheral vascular disease s/p bilateral BKA -- Continue to hold home statin for now  ESRD on HD MWF Patient with clotted AVF.  IR placement of temporary dialysis catheter on 9/27. -- Nephrology following for continue HD while inpatient, appreciate assistance -- Vascular surgery/IR following, plan IR fistulogram with likely intervention on 10/1 -- N.p.o. after midnight  Essential hypertension Home regimen includes isosorbide mononitrate 30 mg p.o. daily. -- Continue to hold given borderline hypotension   Hyperlipidemia -- Hold home atorvastatin 40 mg p.o. daily  Anemia of chronic medical/renal disease -- Hgb 10.1>>11.4>>7.4>9.0>7.4>7.1>7.9>7.3>7.6 -- Transfuse for hemoglobin less than 7.0 -- CBC daily  Goals of care On 05/08/2023, patient discussed with multiple medical providers and staff that she did not want to continue with aggressive care to include hemodialysis in which she signed off early yesterday.  She wanted to focus more on comfort.  But as of 05/09/2023 she reports ""I want to live" and I will continue hemodialysis at this time.  Seen by palliative care with recommendations of full code, full scope of care at  this  PROGRESS NOTE    [name]  WGN:[id number] DOB: [birth year] DOA: 04/30/2023 PCP: [facility]    Brief Narrative:   [name] is a 42 y.o. 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  Onset day prior to admission associated with diarrhea.  In the ED, patient was febrile, tachycardic with lactic acid 1.9 followed by 2.5.  Chest x-ray with findings of atelectasis versus infiltrate.  CT abdomen/pelvis with contrast shows evidence of a small bowel obstruction with dilated ileal segment up to 4 cm, sigmoid segment consistent with colitis, diffuse gastric wall thickening, cholelithiasis with mild chronic prominence of the common bile duct.  General surgery was consulted.  TRH consulted for admission for further evaluation and management of small bowel obstruction.   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  Patient's symptoms continue to progress despite IV antibiotics and conservative measures.  C. difficile antigen positive, toxin negative.  GI PCR panel negative.  Patient underwent diagnostic laparoscopy with extended ileus cecotomy and end ileostomy on 05/04/2023 by Dr. Hillery Hunter.  Pathology notable for ischemia of ileum/cecum, acute appendicitis. Repeat CT abdomen/pelvis 9/26 with residual small bowel thickening consistent with enteritis secondary to infection versus  inflammation versus ischemia, small gas/fluid anterior abdomen extending inferiorly without rim enhancement.  Completed course of 5 antibiotics with ceftriaxone and metronidazole on 9/28. -- General Surgery following, appreciate assistance -- WBC 9.7>>15.5>>10.5>14.0>17.1>18.2>15.7>21.8>20.8 -- Regular diet -- Continue monitor ostomy output; 1.6L last 24h -- wound vac removed 9/28 for poor seal; continue WTD dressings; surgery may attempt replacement wound vac this week -- Ferrous sulfate 325 mg p.o. BID, FiberCon 1250 mg p.o. BID, imodium 2mg  PO TID for high output ostomy  Type 1 diabetes mellitus Hemoglobin A1c 5.7, well-controlled.  Home regimen includes Semglee 12 units [place] BID and NovoLog SSI. -- Semglee 5 units [place] daily -- SSI for coverage -- CBGs qAC/HS  Peripheral vascular disease s/p bilateral BKA -- Continue to hold home statin for now  ESRD on HD MWF Patient with clotted AVF.  IR placement of temporary dialysis catheter on 9/27. -- Nephrology following for continue HD while inpatient, appreciate assistance -- Vascular surgery/IR following, plan IR fistulogram with likely intervention on 10/1 -- N.p.o. after midnight  Essential hypertension Home regimen includes isosorbide mononitrate 30 mg p.o. daily. -- Continue to hold given borderline hypotension   Hyperlipidemia -- Hold home atorvastatin 40 mg p.o. daily  Anemia of chronic medical/renal disease -- Hgb 10.1>>11.4>>7.4>9.0>7.4>7.1>7.9>7.3>7.6 -- Transfuse for hemoglobin less than 7.0 -- CBC daily  Goals of care On 05/08/2023, patient discussed with multiple medical providers and staff that she did not want to continue with aggressive care to include hemodialysis in which she signed off early yesterday.  She wanted to focus more on comfort.  But as of 05/09/2023 she reports ""I want to live" and I will continue hemodialysis at this time.  Seen by palliative care with recommendations of full code, full scope of care at  this  CKMB", "CKMBINDEX", "TROPONINI" in the last 168 hours. BNP (last 3 results) No results for input(s): "PROBNP" in the last 8760 hours. HbA1C: No results for input(s): "HGBA1C" in the last 72 hours. CBG: Recent Labs  Lab 05/12/23 1829 05/12/23 2114 05/13/23 0106 05/13/23 0553 05/13/23 0731  GLUCAP 186* 205* 220* 209* 175*   Lipid Profile: No results for input(s): "CHOL", "HDL", "LDLCALC", "TRIG", "CHOLHDL", "LDLDIRECT" in the last 72 hours. Thyroid Function Tests: No results for  input(s): "TSH", "T4TOTAL", "FREET4", "T3FREE", "THYROIDAB" in the last 72 hours. Anemia Panel: No results for input(s): "VITAMINB12", "FOLATE", "FERRITIN", "TIBC", "IRON", "RETICCTPCT" in the last 72 hours. Sepsis Labs: Recent Labs  Lab 05/10/23 1932 05/12/23 0354  PROCALCITON  --  4.89  LATICACIDVEN 1.5  --     Recent Results (from the past 240 hour(s))  Culture, blood (Routine X 2) w Reflex to ID Panel     Status: None   Collection Time: 05/03/23  1:02 PM   Specimen: BLOOD LEFT HAND  Result Value Ref Range Status   Specimen Description BLOOD LEFT HAND  Final   Special Requests Blood Culture adequate volume AEROBIC BOTTLE ONLY  Final   Culture   Final    NO GROWTH 5 DAYS Performed at [facility], [address], [place], [place] [address]    Report Status 05/08/2023 FINAL  Final  Culture, blood (Routine X 2) w Reflex to ID Panel     Status: None   Collection Time: 05/03/23  1:05 PM   Specimen: BLOOD LEFT HAND  Result Value Ref Range Status   Specimen Description BLOOD LEFT HAND  Final   Special Requests   Final    AEROBIC BOTTLE ONLY Blood Culture results may not be optimal due to an inadequate volume of blood received in culture bottles   Culture   Final    NO GROWTH 5 DAYS Performed at [facility], [address] [place], [place] [address]    Report Status 05/08/2023 FINAL  Final         Radiology Studies: VAS US DUPLEX DIALYSIS ACCESS (AVF, AVG)  Result Date: 05/11/2023 DIALYSIS ACCESS Patient Name:  HOANG REICH  Date of Exam:   05/11/2023 Medical Rec #: [id number]      Accession #:    [id number] Date of Birth: 1981-07-30       Patient Gender: F Patient Age:   56 years Exam Location:  [facility] Procedure:      VAS US DUPLEX DIALYSIS ACCESS (AVF, AVG) Referring Phys: Crista Elliot --------------------------------------------------------------------------------  Reason for Exam: Unable to dialyze through AVF/AVG. Access Site: Right Upper  Extremity. Access Type: Brachial-cephalic AVF. History: HX of RUE AVF revision 10/23/21 (aneurysm complication & ulcerated          skin). Limitations: Calcification Comparison Study: Prior right fistula duplex done 03/17/23 Performing Technologist: Sherren Kerns RVS  Examination Guidelines: A complete evaluation includes B-mode imaging, spectral Doppler, color Doppler, and power Doppler as needed of all accessible portions of each vessel. Unilateral testing is considered an integral part of a complete examination. Limited examinations for reoccurring indications may be performed as noted.  +------------+----------+-------------+----------+--------+ OUTFLOW VEINPSV (cm/s)Diameter (cm)Depth (cm)Describe +------------+----------+-------------+----------+--------+ Clavicle       255                                    +------------+----------+-------------+----------+--------+ Shoulder

## 2023-05-13 NOTE — Progress Notes (Signed)
Progress Note  9 Days Post-Op  Subjective: Pt reports some abdominal pain, but overall getting better. Wants her coffee this AM.   Objective: Vital signs in last 24 hours: Temp:  [98 F (36.7 C)-99.5 F (37.5 C)] 98.2 F (36.8 C) (09/30 0549) Pulse Rate:  [78-89] 78 (09/30 0730) Resp:  [17-18] 18 (09/30 0730) BP: (92-103)/(58-66) 92/60 (09/30 0730) SpO2:  [98 %] 98 % (09/29 1117) Weight:  [19 kg] 19 kg (09/30 0705) Last BM Date : 05/11/23  Intake/Output from previous day: 09/29 0701 - 09/30 0700 In: 540.2 [I.V.:540.2] Out: 1650 [Stool:1650] Intake/Output this shift: No intake/output data recorded.  PE: General: pleasant, WD, chronically ill appearing female who is laying in bed in NAD Abd: soft, NT, ND, ileostomy viable with some liquid output in bag, midline wound with blue-green drainage and musty odor    Lab Results:  Recent Labs    05/12/23 0354 05/13/23 0807  WBC 21.8* 20.8*  HGB 7.3* 7.6*  HCT 24.1* 25.0*  PLT 420* 385   BMET Recent Labs    05/10/23 1950 05/11/23 0232  NA 135 134*  K 4.5 3.5  CL 97* 95*  CO2 22 28  GLUCOSE 107* 80  BUN 41* 14  CREATININE 6.74* 3.11*  CALCIUM 8.5* 8.3*   PT/INR No results for input(s): "LABPROT", "INR" in the last 72 hours. CMP     Component Value Date/Time   NA 134 (L) 05/11/2023 0232   NA 133 (L) 05/18/2020 0927   K 3.5 05/11/2023 0232   CL 95 (L) 05/11/2023 0232   CO2 28 05/11/2023 0232   GLUCOSE 80 05/11/2023 0232   BUN 14 05/11/2023 0232   BUN 45 (H) 05/18/2020 0927   CREATININE 3.11 (H) 05/11/2023 0232   CALCIUM 8.3 (L) 05/11/2023 0232   CALCIUM 12.1 (H) 11/13/2021 2026   PROT 6.2 (L) 05/01/2023 1020   PROT 7.6 05/18/2020 0927   ALBUMIN 1.9 (L) 05/11/2023 0232   ALBUMIN 3.9 05/18/2020 0927   AST 26 05/01/2023 1020   ALT 18 05/01/2023 1020   ALKPHOS 72 05/01/2023 1020   BILITOT 0.9 05/01/2023 1020   BILITOT <0.2 05/18/2020 0927   GFRNONAA 19 (L) 05/11/2023 0232   GFRAA 6 (L) 05/18/2020  0927   Lipase     Component Value Date/Time   LIPASE 40 12/20/2022 0918       Studies/Results: VAS US DUPLEX DIALYSIS ACCESS (AVF, AVG)  Result Date: 05/11/2023 DIALYSIS ACCESS Patient Name:  ANELI DONLIN  Date of Exam:   05/11/2023 Medical Rec #: [id number]      Accession #:    [id number] Date of Birth: 1980/10/22       Patient Gender: F Patient Age:   66 years Exam Location:  [facility] Procedure:      VAS US DUPLEX DIALYSIS ACCESS (AVF, AVG) Referring Phys: Crista Elliot --------------------------------------------------------------------------------  Reason for Exam: Unable to dialyze through AVF/AVG. Access Site: Right Upper Extremity. Access Type: Brachial-cephalic AVF. History: HX of RUE AVF revision 10/23/21 (aneurysm complication & ulcerated          skin). Limitations: Calcification Comparison Study: Prior right fistula duplex done 03/17/23 Performing Technologist: Sherren Kerns RVS  Examination Guidelines: A complete evaluation includes B-mode imaging, spectral Doppler, color Doppler, and power Doppler as needed of all accessible portions of each vessel. Unilateral testing is considered an integral part of a complete examination. Limited examinations for reoccurring indications may be performed as noted.  +------------+----------+-------------+----------+--------+ OUTFLOW VEINPSV (cm/s)Diameter (cm)Depth (cm)Describe +------------+----------+-------------+----------+--------+  Clavicle       255                                    +------------+----------+-------------+----------+--------+ Shoulder        57                                    +------------+----------+-------------+----------+--------+ Prox UA         56                                    +------------+----------+-------------+----------+--------+ Mid UA          82                                    +------------+----------+-------------+----------+--------+ Dist UA        101                                     +------------+----------+-------------+----------+--------+ AC Fossa       123                                    +------------+----------+-------------+----------+--------+ Prox Forearm   172                                    +------------+----------+-------------+----------+--------+ Thrombus noted mid to proximal upper arm  Summary: Arteriovenous fistula-Velocities less than 100cm/s noted throughout the upper arm. Arteriovenous fistula-Thrombus noted mid to proximal upper arm. *See table(s) above for measurements and observations.      --------------------------------------------------------------------------------   Preliminary     Anti-infectives: Anti-infectives (From admission, onward)    Start     Dose/Rate Route Frequency Ordered Stop   05/08/23 2200  cefTRIAXone (ROCEPHIN) 2 g in sodium chloride 0.9 % 100 mL IVPB        2 g 200 mL/hr over 30 Minutes Intravenous Every 24 hours 05/08/23 1613 05/11/23 2232   05/08/23 2200  metroNIDAZOLE (FLAGYL) IVPB 500 mg        500 mg 100 mL/hr over 60 Minutes Intravenous Every 12 hours 05/08/23 1613 05/11/23 2346   05/04/23 0200  cefTRIAXone (ROCEPHIN) 2 g in sodium chloride 0.9 % 100 mL IVPB        2 g 200 mL/hr over 30 Minutes Intravenous Every 24 hours 05/04/23 0102 05/08/23 0144   05/04/23 0200  metroNIDAZOLE (FLAGYL) IVPB 500 mg        500 mg 100 mL/hr over 60 Minutes Intravenous Every 12 hours 05/04/23 0102 05/09/23 1203   04/30/23 1800  ciprofloxacin (CIPRO) IVPB 400 mg  Status:  Discontinued        400 mg 200 mL/hr over 60 Minutes Intravenous Every 24 hours 04/30/23 1443 05/02/23 1140   04/30/23 1515  metroNIDAZOLE (FLAGYL) IVPB 500 mg  Status:  Discontinued        500 mg 100 mL/hr over 60 Minutes Intravenous Every 12 hours 04/30/23 1418 05/02/23 1140  Assessment/Plan  42 yo female presenting with abdominal pain, diarrhea, low grade fevers, dilated small bowel and colonic wall thickening on  CT   POD#9 s/p diagnostic lap converted to ex-lap with extended ileo-cecectomy (mid-ileum to just past cecum), 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 Hillery Hunter - Intra-op findings were notable for adhesions in the RLQ w/ associated twisting of the mesentery resulting in SB ischemia. - CT 9/26 ordered due to leukocytosis but overall looks ok, some residual small bowel thickening but no large abscess. Off IV abx as of 9/28. Patient tolerating PO and having ileostomy output. WBC stable at 20, afebrile. Recheck in AM - Wound appears infected/colonized with pseudomonas - start Dakin's wet to dry dressings BID x72 hrs - WOC RN following for new ileostomy. High volume output (1600), continue BID iron, increase dose of BID fiber. Increase imodium to TID today  - mobilize, continue therapies - rec SNF   FEN: reg, Glucerna ID: Rocephin/Flagyl 9/21 > 9/28 VTE: SCDs, SQH q 8h Foley: out  LOS: 13 days    Juliet Rude, [facility] 05/13/2023, 9:22 AM Please see Amion for pager number during day hours 7:00am-4:30pm

## 2023-05-13 NOTE — Plan of Care (Signed)
Addressing wound in  the midline

## 2023-05-13 NOTE — Consult Note (Signed)
WOC consulted over the weekend for NPWT and ostomy, however WOC nursing is not available for weekend coverage. Completed consult order, patient is on WOC follow up list for today for scheduled NPWT dressings and ostomy teaching.     Bekki Tavenner [facility] MSN, RN,CWOCN, CNS, CWON-AP [phone])

## 2023-05-14 ENCOUNTER — Inpatient Hospital Stay (HOSPITAL_COMMUNITY): Payer: Medicare Other

## 2023-05-14 DIAGNOSIS — K56609 Unspecified intestinal obstruction, unspecified as to partial versus complete obstruction: Secondary | ICD-10-CM | POA: Diagnosis not present

## 2023-05-14 HISTORY — PX: IR US GUIDE VASC ACCESS RIGHT: IMG2390

## 2023-05-14 HISTORY — PX: IR AV DIALY SHUNT INTRO NEEDLE/INTRACATH INITIAL W/PTA/IMG RIGHT: IMG6116

## 2023-05-14 LAB — GLUCOSE, CAPILLARY
Glucose-Capillary: 71 mg/dL (ref 70–99)
Glucose-Capillary: 170 mg/dL — ABNORMAL HIGH (ref 70–99)
Glucose-Capillary: 62 mg/dL — ABNORMAL LOW (ref 70–99)
Glucose-Capillary: 126 mg/dL — ABNORMAL HIGH (ref 70–99)
Glucose-Capillary: 65 mg/dL — ABNORMAL LOW (ref 70–99)
Glucose-Capillary: 62 mg/dL — ABNORMAL LOW (ref 70–99)
Glucose-Capillary: 147 mg/dL — ABNORMAL HIGH (ref 70–99)
Glucose-Capillary: 72 mg/dL (ref 70–99)
Glucose-Capillary: 98 mg/dL (ref 70–99)

## 2023-05-14 LAB — RENAL FUNCTION PANEL
Albumin: 2 g/dL — ABNORMAL LOW (ref 3.5–5.0)
Anion gap: 12 (ref 5–15)
BUN: 17 mg/dL (ref 6–20)
CO2: 29 mmol/L (ref 22–32)
Calcium: 9 mg/dL (ref 8.9–10.3)
Chloride: 93 mmol/L — ABNORMAL LOW (ref 98–111)
Creatinine, Ser: 4.37 mg/dL — ABNORMAL HIGH (ref 0.44–1.00)
GFR, Estimated: 12 mL/min — ABNORMAL LOW (ref >60)
Glucose, Bld: 109 mg/dL — ABNORMAL HIGH (ref 70–99)
Phosphorus: 4.9 mg/dL — ABNORMAL HIGH (ref 2.5–4.6)
Potassium: 4.4 mmol/L (ref 3.5–5.1)
Sodium: 134 mmol/L — ABNORMAL LOW (ref 135–145)

## 2023-05-14 LAB — CBC
HCT: 25.3 % — ABNORMAL LOW (ref 36.0–46.0)
HCT: 24.6 % — ABNORMAL LOW (ref 36.0–46.0)
Hemoglobin: 7.9 g/dL — ABNORMAL LOW (ref 12.0–15.0)
Hemoglobin: 7.6 g/dL — ABNORMAL LOW (ref 12.0–15.0)
MCH: 26.6 pg (ref 26.0–34.0)
MCH: 26.5 pg (ref 26.0–34.0)
MCHC: 31.2 g/dL (ref 30.0–36.0)
MCHC: 30.9 g/dL (ref 30.0–36.0)
MCV: 85.2 fL (ref 80.0–100.0)
MCV: 85.7 fL (ref 80.0–100.0)
Platelets: 365 10*3/uL (ref 150–400)
Platelets: 372 10*3/uL (ref 150–400)
RBC: 2.97 MIL/uL — ABNORMAL LOW (ref 3.87–5.11)
RBC: 2.87 MIL/uL — ABNORMAL LOW (ref 3.87–5.11)
RDW: 19.9 % — ABNORMAL HIGH (ref 11.5–15.5)
RDW: 19.7 % — ABNORMAL HIGH (ref 11.5–15.5)
WBC: 14.3 10*3/uL — ABNORMAL HIGH (ref 4.0–10.5)
WBC: 15.5 10*3/uL — ABNORMAL HIGH (ref 4.0–10.5)
nRBC: 0.2 % (ref 0.0–0.2)
nRBC: 0.1 % (ref 0.0–0.2)

## 2023-05-14 LAB — PROTIME-INR
INR: 1.9 — ABNORMAL HIGH (ref 0.8–1.2)
Prothrombin Time: 21.6 s — ABNORMAL HIGH (ref 11.4–15.2)

## 2023-05-14 MED ORDER — ALTEPLASE 2 MG IJ SOLR: 2.0000 mg | Freq: Once | INTRAMUSCULAR | Status: DC | PRN

## 2023-05-14 MED ORDER — ANTICOAGULANT SODIUM CITRATE 4% (200MG/5ML) IV SOLN: 5.0000 mL | Status: DC | PRN

## 2023-05-14 MED ORDER — LIDOCAINE HCL (PF) 1 % IJ SOLN: 5.0000 mL | INTRAMUSCULAR | Status: DC | PRN

## 2023-05-14 MED ORDER — MIDAZOLAM HCL 2 MG/2ML IJ SOLN
INTRAMUSCULAR | Status: AC | PRN
Start: 2023-05-14 — End: 2023-05-14
  Administered 2023-05-14: .5 mg via INTRAVENOUS

## 2023-05-14 MED ORDER — FENTANYL CITRATE (PF) 100 MCG/2ML IJ SOLN
INTRAMUSCULAR | Status: AC
  Filled 2023-05-14: qty 2

## 2023-05-14 MED ORDER — MIDAZOLAM HCL 2 MG/2ML IJ SOLN
INTRAMUSCULAR | Status: AC
  Filled 2023-05-14: qty 2

## 2023-05-14 MED ORDER — PENTAFLUOROPROP-TETRAFLUOROETH EX AERO: 1.0000 | INHALATION_SPRAY | CUTANEOUS | Status: DC | PRN

## 2023-05-14 MED ORDER — DEXTROSE 50 % IV SOLN
12.5000 g | INTRAVENOUS | Status: AC
  Administered 2023-05-14: 12.5 g via INTRAVENOUS
  Filled 2023-05-14: qty 50

## 2023-05-14 MED ORDER — HEPARIN SODIUM (PORCINE) 1000 UNIT/ML DIALYSIS: 1000.0000 [IU] | INTRAMUSCULAR | Status: DC | PRN

## 2023-05-14 MED ORDER — LIDOCAINE-PRILOCAINE 2.5-2.5 % EX CREA: 1.0000 | TOPICAL_CREAM | CUTANEOUS | Status: DC | PRN

## 2023-05-14 MED ORDER — NEPRO/CARBSTEADY PO LIQD: 237.0000 mL | ORAL | Status: DC | PRN

## 2023-05-14 MED ORDER — INSULIN ASPART 100 UNIT/ML IJ SOLN: 0.0000 [IU] | INTRAMUSCULAR | Status: DC

## 2023-05-14 MED ORDER — LIDOCAINE HCL 1 % IJ SOLN
INTRAMUSCULAR | Status: AC
  Filled 2023-05-14: qty 20

## 2023-05-14 MED ORDER — IOHEXOL 300 MG/ML  SOLN
100.0000 mL | Freq: Once | INTRAMUSCULAR | Status: AC | PRN
  Administered 2023-05-14: 100 mL via INTRAVENOUS

## 2023-05-14 MED ORDER — CHLORHEXIDINE GLUCONATE CLOTH 2 % EX PADS
6.0000 | MEDICATED_PAD | Freq: Every day | CUTANEOUS | Status: DC
  Administered 2023-05-15 – 2023-05-18 (×3): 6 via TOPICAL

## 2023-05-14 NOTE — Progress Notes (Signed)
OT Cancellation Note  Patient Details Name: Edwena Mayorga MRN: [id number] DOB: 05-16-1981   Cancelled Treatment:    Reason Eval/Treat Not Completed: Patient at procedure or test/ unavailable (off unit, will follow up for OT tx as schedule permits)  Carver Fila, OTD, OTR/L SecureChat Preferred Acute Rehab [phone]   Carver Fila Koonce 05/14/2023, 3:25 PM

## 2023-05-14 NOTE — Procedures (Signed)
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  Aneurysm is partially thrombosed with clot.  Stenosis at cephalic arch, right subclavian vein and right innominate vein.  Right cephalic stenosis treated with 7 mm balloon and improved flow after treatment.  Central venous stenosis treated with balloon angioplasty up to 10 mm and improved flow after treatment.    Complications: None     EBL: Minimal  Plan: 1) Marked skin in right upper arm where the aneurysm is located.  Recommend avoiding this area for dialysis cannulation.   2) Suture can be removed tomorrow at dialysis.  Jasdeep Dejarnett R. Lowella Dandy, MD  Pager: [phone]

## 2023-05-14 NOTE — Progress Notes (Signed)
Tried to do patients wound care this morning."She stated for me to get out of her face and to leave her alone".

## 2023-05-14 NOTE — Progress Notes (Signed)
[facility] NEPHROLOGY PROGRESS NOTE  Subjective: Seen and examined at the bedside.  No new event.  Denies nausea, vomiting, chest pain or shortness of breath.  Objective Vital signs in last 24 hours: Vitals:   05/13/23 0107 05/13/23 0549 05/13/23 0705 05/13/23 0730  BP: 103/66 98/63  92/60  Pulse: 89 89  78  Resp: 18 18  18   Temp: 98.2 F (36.8 C) 98.2 F (36.8 C)    TempSrc: Oral Oral    SpO2:      Weight:   19 kg    Physical Exam: General: Not in distress.  Lying comfortable. Heart:RRR, s1s2 nl Lungs:clear b/l, no crackle Abdomen: Soft, nondistended Extremities: Bilateral BKA Dialysis Access: Right AV fistula has faint thrill  OP HD: [facility] MWF  3h  400/1.5  49kg   R AVF  Hep none - last OP HD 9/16, post wt 51kg, coming off 2- 4kg over the last 3 wks - mircera IV q 2 wks, last 9/13, due 9/27 - hectorol 4 mcg IV three times per week.   Assessment/ Plan:  # Abdominal pain - partial SBO s/p ex-lap with extended ileo-cecectomy with end ileostomy. Per primary/Gen surgery.   #Sepsis 2/2 to ischemic Colitis - completed IV abx on 9/28. Per pmd.   # ESRD - on HD MWF.  Unable to cannulate AV fistula on 9/27 because of clotting.  IR placed temporary HD catheter and received dialysis on 9/27.  Duplex US of AVF with low blood flow, vascular surgeon and IR were consulted.  Plan for AVF declotting but today she has very good bruit/ thrill over the AVF. Will attempt to use AVF tomorrow.  # HTN/Volume - no vol excess on exam. UF wth HD.   # Anemia due to ESRD, continue ESA.  Monitor hemoglobin.  # CKD-MBD - sevelamer for hyperphosphatemia.  Cont IV vdra. Resume sensipar when eating.   #PAD - bilat AKA.    Tracey Moselle  MD  [facility] 05/14/2023, 4:29 PM  Recent Labs  Lab 05/11/23 0232 05/12/23 0354 05/13/23 0807 05/14/23 1057  HGB 7.9*   < > 7.6* 7.9*  ALBUMIN 1.9*  --  1.7*  --   CALCIUM 8.3*  --  8.4*  --   PHOS 3.2  --  5.7*  --   CREATININE 3.11*   --  6.02*  --   K 3.5  --  4.1  --    < > = values in this interval not displayed.    Inpatient medications:  Chlorhexidine Gluconate Cloth  6 each Topical Q0600   Chlorhexidine Gluconate Cloth  6 each Topical Q0600   Chlorhexidine Gluconate Cloth  6 each Topical Q0600   [START ON 05/17/2023] darbepoetin (ARANESP) injection - DIALYSIS  60 mcg Subcutaneous Q Fri-1800   doxercalciferol  4 mcg Intravenous Q M,W,F-HD   feeding supplement (GLUCERNA SHAKE)  237 mL Oral TID BM   ferrous sulfate  325 mg Oral BID WC   heparin  5,000 Units Subcutaneous Q8H   insulin aspart  0-6 Units Subcutaneous Q4H   loperamide  2 mg Oral TID   pantoprazole  40 mg Oral QHS   polycarbophil  1,250 mg Oral BID   sevelamer carbonate  0.8 g Oral TID WC   sodium hypochlorite   Irrigation BID    anticoagulant sodium citrate     acetaminophen **OR** acetaminophen, albuterol, alteplase, anticoagulant sodium citrate, heparin, hydrALAZINE, HYDROcodone-acetaminophen, HYDROmorphone (DILAUDID) injection, lidocaine (PF), lidocaine-prilocaine, midazolam, ondansetron **OR** ondansetron (  ZOFRAN) IV, pentafluoroprop-tetrafluoroeth

## 2023-05-14 NOTE — Progress Notes (Signed)
PT Cancellation Note  Patient Details Name: [name] MRN: [id number] DOB: 04-09-1981   Cancelled Treatment:    Reason Eval/Treat Not Completed: (P) Patient declined, no reason specified, pt declining stating she is "hurting all over" RN present and aware. Will check back as schedule allows to continue with PT POC.  Lenora Boys. PTA Acute Rehabilitation Services Office: [phone]    Catalina Antigua 05/14/2023, 2:15 PM

## 2023-05-14 NOTE — Progress Notes (Signed)
PROGRESS NOTE    [name]  ZOX:[id number] DOB: 18-Oct-1980 DOA: 04/30/2023 PCP: [facility]    Brief Narrative:   [name] is a 42 y.o. 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  Onset day prior to admission associated with diarrhea.  In the ED, patient was febrile, tachycardic with lactic acid 1.9 followed by 2.5.  Chest x-ray with findings of atelectasis versus infiltrate.  CT abdomen/pelvis with contrast shows evidence of a small bowel obstruction with dilated ileal segment up to 4 cm, sigmoid segment consistent with colitis, diffuse gastric wall thickening, cholelithiasis with mild chronic prominence of the common bile duct.  General surgery was consulted.  TRH consulted for admission for further evaluation and management of small bowel obstruction.   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  Patient's symptoms continue to progress despite IV antibiotics and conservative measures.  C. difficile antigen positive, toxin negative.  GI PCR panel negative.  Patient underwent diagnostic laparoscopy with extended ileus cecotomy and end ileostomy on 05/04/2023 by Dr. Hillery Hunter.  Pathology notable for ischemia of ileum/cecum, acute appendicitis. Repeat CT abdomen/pelvis 9/26 with residual small bowel thickening consistent with enteritis secondary to infection versus  inflammation versus ischemia, small gas/fluid anterior abdomen extending inferiorly without rim enhancement.  Completed course of 5 antibiotics with ceftriaxone and metronidazole on 9/28. -- General Surgery following, appreciate assistance -- WBC 9.7>>15.5>>10.5>14.0>17.1>18.2>15.7>21.8>20.8 -- Regular diet -- Continue monitor ostomy output; ? last 24h -- wound vac removed 9/28 for poor seal; continue WTD dressings; surgery may attempt replacement wound vac this week -- Ferrous sulfate 325 mg p.o. BID, FiberCon 1250 mg p.o. BID, imodium 2mg  PO TID for high output ostomy  Type 1 diabetes mellitus Hemoglobin A1c 5.7, well-controlled.  Home regimen includes Semglee 12 units [place] BID and NovoLog SSI. -- Semglee 5 units Newman daily -- SSI for coverage -- CBGs qAC/HS  Peripheral vascular disease s/p bilateral BKA -- Continue to hold home statin for now  ESRD on HD MWF Patient with clotted AVF.  IR placement of temporary dialysis catheter on 9/27. -- Nephrology following for continue HD while inpatient, appreciate assistance -- Vascular surgery/IR following, plan IR fistulogram with likely intervention on today  Essential hypertension Home regimen includes isosorbide mononitrate 30 mg p.o. daily. -- Continue to hold given borderline hypotension   Hyperlipidemia -- Hold home atorvastatin 40 mg p.o. daily  Anemia of chronic medical/renal disease -- Hgb 10.1>>11.4>>7.4>9.0>7.4>7.1>7.9>7.3>7.6 -- Transfuse for hemoglobin less than 7.0 -- CBC daily  Goals of care On 05/08/2023, patient discussed with multiple medical providers and staff that she did not want to continue with aggressive care to include hemodialysis in which she signed off early yesterday.  She wanted to focus more on comfort.  But as of 05/09/2023 she reports ""I want to live" and I will continue hemodialysis at this time.  Seen by palliative care with recommendations of full code, full scope of care at this time.  DVT  prophylaxis: heparin injection 5,000 Units Start: 04/30/23 0900    Code Status: Full Code Family Communication: No family present at bedside this morning  Disposition Plan:  Level of care: Med-Surg Status is: Inpatient Remains inpatient appropriate because: Pending IR intervention for fistulogram today, plan to return to [facility] once specialist sign off    Consultants:  General surgery Infectious disease PCCM Nephrology Interventional radiology  Procedures:  Diagnostic laparoscopy with extended ileocecostomy and end ileostomy, Dr Hillery Hunter; 9/21 Planned fistulogram with likely intervention: pending  Antimicrobials:  Metronidazole 9/17 - 9/18, 9/20 - 9/28 Ciprofloxacin 9/17 - 5/17 Ceftriaxone 9/20 - 9/28   Subjective: Patient seen examined bedside, lying in bed.  Sleeping but easily arousable.  Asking when she can go back to rehab.  Plan for IR fistulogram today for clotted AVF.   Reports tolerating diet without nausea/vomiting. No other specific complaints or concerns at this time.  Denies headache, no chest pain, no shortness of breath, no fever.  No acute concerns overnight per nursing staff.  Objective: Vitals:   05/14/23 0044 05/14/23 0150 05/14/23 0352 05/14/23 0808  BP: (!) 92/58 (!) 97/56 110/61 110/65  Pulse:  91 88   Resp: 17     Temp: 98.4 F (36.9 C)  98.8 F (37.1 C)   TempSrc: Oral  Oral   SpO2: 99%     Weight:        Intake/Output Summary (Last 24 hours) at 05/14/2023 0850 Last data filed at 05/14/2023 0044 Gross per 24 hour  Intake --  Output 350 ml  Net -350 ml   Filed Weights   05/11/23 0530 05/12/23 0532 05/13/23 0705  Weight: 58 kg 52 kg 19 kg    Examination:  Physical Exam: GEN: NAD, chronically ill appearance HEENT: NCAT, PERRL, EOMI, sclera clear PULM: CTAB w/o wheezes/crackles, normal respiratory effort, on room air CV: RRR w/o M/G/R GI: abd soft, mild TTP mid abdomen, nondistended, + BS, ostomy noted with air/brown stool in  collection bag, abdominal dressing in place, clean/dry/intact  MSK: Noted bilateral BKA PSYCH: Depressed mood, flat affect     Data Reviewed: I have personally reviewed following labs and imaging studies  CBC: Recent Labs  Lab 05/09/23 0217 05/10/23 0824 05/11/23 0232 05/12/23 0354 05/13/23 0807  WBC 17.1* 18.2* 15.7* 21.8* 20.8*  HGB 8.1* 7.1* 7.9* 7.3* 7.6*  HCT 26.1* 23.1* 25.1* 24.1* 25.0*  MCV 82.6 83.4 81.5 83.4 88.0  PLT 345 434* 426* 420* 385   Basic Metabolic Panel: Recent Labs  Lab 05/08/23 0846 05/10/23 0824 05/10/23 1950 05/11/23 0232 05/13/23 0807  NA 130* 133* 135 134* 137  K 3.5 3.9 4.5 3.5 4.1  CL 90* 95* 97* 95* 100  CO2 23 22 22 28 23   GLUCOSE 87 114* 107* 80 173*  BUN 38* 40* 41* 14 28*  CREATININE 4.76* 6.23* 6.74* 3.11* 6.02*  CALCIUM 7.9* 8.5* 8.5* 8.3* 8.4*  PHOS 5.2* 6.3* 7.0* 3.2 5.7*   GFR: Estimated Creatinine Clearance: 3.7 mL/min (A) (by C-G formula based on SCr of 6.02 mg/dL (H)). Liver Function Tests: Recent Labs  Lab 05/08/23 0846 05/10/23 0824 05/10/23 1950 05/11/23 0232 05/13/23 0807  ALBUMIN 1.6* 1.8* 1.9* 1.9* 1.7*   No results for input(s): "LIPASE", "AMYLASE" in the last 168 hours. No results for input(s): "AMMONIA" in the last 168 hours. Coagulation Profile: No results for input(s): "INR", "PROTIME" in the last 168 hours. Cardiac Enzymes: No results for input(s): "CKTOTAL", "CKMB", "CKMBINDEX", "TROPONINI" in the last 168  hours. BNP (last 3 results) No results for input(s): "PROBNP" in the last 8760 hours. HbA1C: No results for input(s): "HGBA1C" in the last 72 hours. CBG: Recent Labs  Lab 05/13/23 1716 05/14/23 0152 05/14/23 0639 05/14/23 0719 05/14/23 0822  GLUCAP 84 71 62* 170* 126*   Lipid Profile: No results for input(s): "CHOL", "HDL", "LDLCALC", "TRIG", "CHOLHDL", "LDLDIRECT" in the last 72 hours. Thyroid Function Tests: No results for input(s): "TSH", "T4TOTAL", "FREET4", "T3FREE", "THYROIDAB"  in the last 72 hours. Anemia Panel: No results for input(s): "VITAMINB12", "FOLATE", "FERRITIN", "TIBC", "IRON", "RETICCTPCT" in the last 72 hours. Sepsis Labs: Recent Labs  Lab 05/10/23 1932 05/12/23 0354  PROCALCITON  --  4.89  LATICACIDVEN 1.5  --     No results found for this or any previous visit (from the past 240 hour(s)).        Radiology Studies: No results found.      Scheduled Meds:  Chlorhexidine Gluconate Cloth  6 each Topical Q0600   Chlorhexidine Gluconate Cloth  6 each Topical Q0600   Chlorhexidine Gluconate Cloth  6 each Topical Q0600   [START ON 05/17/2023] darbepoetin (ARANESP) injection - DIALYSIS  60 mcg Subcutaneous Q Fri-1800   doxercalciferol  4 mcg Intravenous Q M,W,F-HD   feeding supplement (GLUCERNA SHAKE)  237 mL Oral TID BM   ferrous sulfate  325 mg Oral BID WC   heparin  5,000 Units Subcutaneous Q8H   insulin aspart  0-6 Units Subcutaneous Q4H   loperamide  2 mg Oral TID   pantoprazole  40 mg Oral QHS   polycarbophil  1,250 mg Oral BID   sevelamer carbonate  0.8 g Oral TID WC   sodium hypochlorite   Irrigation BID   Continuous Infusions:  anticoagulant sodium citrate       LOS: 14 days    Time spent: 53 minutes spent on chart review, discussion with nursing staff, consultants, updating family and interview/physical exam; more than 50% of that time was spent in counseling and/or coordination of care.    Alvira Philips [place], DO [facility] Available via Epic secure chat 7am-7pm After these hours, please refer to coverage provider listed on [email or web address] 05/14/2023, 8:50 AM

## 2023-05-14 NOTE — Progress Notes (Signed)
Patient ID: [name], female   DOB: July 22, 1981, 42 y.o.   MRN: [id number] [facility] Surgery Progress Note  10 Days Post-Op  Subjective: CC-  Complaining of mild abdominal pain today. No n/v. Tolerated diet yesterday. States that she is hungry this morning, but NPO for fistulogram.  Per nurse patient's ostomy pouch leaked last night when she got back from dialysis, the pouch had come off and there was stool all over the bed.   Objective: Vital signs in last 24 hours: Temp:  [97.9 F (36.6 C)-98.8 F (37.1 C)] 98.8 F (37.1 C) (10/01 0352) Pulse Rate:  [83-98] 88 (10/01 0352) Resp:  [7-18] 17 (10/01 0044) BP: (86-127)/(56-77) 110/65 (10/01 0808) SpO2:  [98 %-100 %] 99 % (10/01 0044) Last BM Date : 05/13/23  Intake/Output from previous day: 09/30 0701 - 10/01 0700 In: -  Out: 350 [Stool:150] Intake/Output this shift: No intake/output data recorded.  PE: General: pleasant, WD, chronically ill appearing female who is laying in bed in NAD Abd: soft, NT, ND, ileostomy viable with small amount of thin liquid output in bag, midline wound pale pink with no drainage on packing today  Lab Results:  Recent Labs    05/12/23 0354 05/13/23 0807  WBC 21.8* 20.8*  HGB 7.3* 7.6*  HCT 24.1* 25.0*  PLT 420* 385   BMET Recent Labs    05/13/23 0807  NA 137  K 4.1  CL 100  CO2 23  GLUCOSE 173*  BUN 28*  CREATININE 6.02*  CALCIUM 8.4*   PT/INR No results for input(s): "LABPROT", "INR" in the last 72 hours. CMP     Component Value Date/Time   NA 137 05/13/2023 0807   NA 133 (L) 05/18/2020 0927   K 4.1 05/13/2023 0807   CL 100 05/13/2023 0807   CO2 23 05/13/2023 0807   GLUCOSE 173 (H) 05/13/2023 0807   BUN 28 (H) 05/13/2023 0807   BUN 45 (H) 05/18/2020 0927   CREATININE 6.02 (H) 05/13/2023 0807   CALCIUM 8.4 (L) 05/13/2023 0807   CALCIUM 12.1 (H) 11/13/2021 2026   PROT 6.2 (L) 05/01/2023 1020   PROT 7.6 05/18/2020 0927   ALBUMIN 1.7 (L) 05/13/2023 0807    ALBUMIN 3.9 05/18/2020 0927   AST 26 05/01/2023 1020   ALT 18 05/01/2023 1020   ALKPHOS 72 05/01/2023 1020   BILITOT 0.9 05/01/2023 1020   BILITOT <0.2 05/18/2020 0927   GFRNONAA 8 (L) 05/13/2023 0807   GFRAA 6 (L) 05/18/2020 0927   Lipase     Component Value Date/Time   LIPASE 40 12/20/2022 0918       Studies/Results: No results found.  Anti-infectives: Anti-infectives (From admission, onward)    Start     Dose/Rate Route Frequency Ordered Stop   05/08/23 2200  cefTRIAXone (ROCEPHIN) 2 g in sodium chloride 0.9 % 100 mL IVPB        2 g 200 mL/hr over 30 Minutes Intravenous Every 24 hours 05/08/23 1613 05/11/23 2232   05/08/23 2200  metroNIDAZOLE (FLAGYL) IVPB 500 mg        500 mg 100 mL/hr over 60 Minutes Intravenous Every 12 hours 05/08/23 1613 05/11/23 2346   05/04/23 0200  cefTRIAXone (ROCEPHIN) 2 g in sodium chloride 0.9 % 100 mL IVPB        2 g 200 mL/hr over 30 Minutes Intravenous Every 24 hours 05/04/23 0102 05/08/23 0144   05/04/23 0200  metroNIDAZOLE (FLAGYL) IVPB 500 mg        500  mg 100 mL/hr over 60 Minutes Intravenous Every 12 hours 05/04/23 0102 05/09/23 1203   04/30/23 1800  ciprofloxacin (CIPRO) IVPB 400 mg  Status:  Discontinued        400 mg 200 mL/hr over 60 Minutes Intravenous Every 24 hours 04/30/23 1443 05/02/23 1140   04/30/23 1515  metroNIDAZOLE (FLAGYL) IVPB 500 mg  Status:  Discontinued        500 mg 100 mL/hr over 60 Minutes Intravenous Every 12 hours 04/30/23 1418 05/02/23 1140        Assessment/Plan 42 yo female presenting with abdominal pain, diarrhea, low grade fevers, dilated small bowel and colonic wall thickening on CT   POD#10 s/p diagnostic lap converted to ex-lap with extended ileo-cecectomy (mid-ileum to just past cecum), 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 Hillery Hunter - Intra-op findings were notable for adhesions in the RLQ w/ associated twisting of the mesentery resulting in SB ischemia. - CT 9/26 ordered due to leukocytosis but overall  looks ok, some residual small bowel thickening but no large abscess. Off IV abx as of 9/28. Patient tolerating PO and having ileostomy output. - Dakin's wet to dry dressings BID x72 hrs (started 9/30) - WOC RN following for new ileostomy.  - Has had high volume ileostomy output, bag leaked yesterday so unsure of output for the last 24hr. Continue BID iron, BID fiber, and TID imodium today - mobilize, continue therapies - rec SNF   FEN: reg, Glucerna ID: Rocephin/Flagyl 9/21 > 9/28 VTE: SCDs, SQH q 8h Foley: out    LOS: 14 days    Franne Forts, [facility] Surgery 05/14/2023, 9:21 AM Please see Amion for pager number during day hours 7:00am-4:30pm

## 2023-05-15 DIAGNOSIS — K56609 Unspecified intestinal obstruction, unspecified as to partial versus complete obstruction: Secondary | ICD-10-CM | POA: Diagnosis not present

## 2023-05-15 LAB — RENAL FUNCTION PANEL
Albumin: 1.9 g/dL — ABNORMAL LOW (ref 3.5–5.0)
Anion gap: 15 (ref 5–15)
BUN: 20 mg/dL (ref 6–20)
CO2: 26 mmol/L (ref 22–32)
Calcium: 8.8 mg/dL — ABNORMAL LOW (ref 8.9–10.3)
Chloride: 92 mmol/L — ABNORMAL LOW (ref 98–111)
Creatinine, Ser: 4.76 mg/dL — ABNORMAL HIGH (ref 0.44–1.00)
GFR, Estimated: 11 mL/min — ABNORMAL LOW (ref >60)
Glucose, Bld: 76 mg/dL (ref 70–99)
Phosphorus: 5.1 mg/dL — ABNORMAL HIGH (ref 2.5–4.6)
Potassium: 4.4 mmol/L (ref 3.5–5.1)
Sodium: 133 mmol/L — ABNORMAL LOW (ref 135–145)

## 2023-05-15 LAB — CBC
HCT: 23.4 % — ABNORMAL LOW (ref 36.0–46.0)
Hemoglobin: 7.3 g/dL — ABNORMAL LOW (ref 12.0–15.0)
MCH: 26.7 pg (ref 26.0–34.0)
MCHC: 31.2 g/dL (ref 30.0–36.0)
MCV: 85.7 fL (ref 80.0–100.0)
Platelets: 367 10*3/uL (ref 150–400)
RBC: 2.73 MIL/uL — ABNORMAL LOW (ref 3.87–5.11)
RDW: 19.6 % — ABNORMAL HIGH (ref 11.5–15.5)
WBC: 16.5 10*3/uL — ABNORMAL HIGH (ref 4.0–10.5)
nRBC: 0.2 % (ref 0.0–0.2)

## 2023-05-15 LAB — GLUCOSE, CAPILLARY
Glucose-Capillary: 70 mg/dL (ref 70–99)
Glucose-Capillary: 84 mg/dL (ref 70–99)
Glucose-Capillary: 84 mg/dL (ref 70–99)
Glucose-Capillary: 54 mg/dL — ABNORMAL LOW (ref 70–99)
Glucose-Capillary: 81 mg/dL (ref 70–99)
Glucose-Capillary: 120 mg/dL — ABNORMAL HIGH (ref 70–99)
Glucose-Capillary: 111 mg/dL — ABNORMAL HIGH (ref 70–99)
Glucose-Capillary: 66 mg/dL — ABNORMAL LOW (ref 70–99)

## 2023-05-15 NOTE — Progress Notes (Signed)
Pt suture removed from access on 05/15/2023.per Richarda Overlie, MD note from 10/01 1630 to remove suture.

## 2023-05-15 NOTE — Progress Notes (Signed)
PROGRESS NOTE [name]  JWJ:[id number] DOB: 1981/02/26 DOA: 04/30/2023 PCP: [facility]  Brief Narrative/Hospital Course: 42 y.o. 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  Onset day prior to admission associated with diarrhea. In the ED, patient was febrile, tachycardic with lactic acid 1.9 followed by 2.5. Chest x-ray>findings of atelectasis versus infiltrate. CT abdomen/pelvis with contrast>>shows evidence of a small bowel obstruction with dilated ileal segment up to 4 cm, sigmoid segment consistent with colitis, diffuse gastric wall thickening, cholelithiasis with mild chronic prominence of the common bile duct.  General surgery was consulted and admitted for further management of small bowel obstruction  partial Nephrology has been consulted for dialysis aVF clotted off S/P TDC.  IR fistulogram and balloon angioplasty of cephalic vein and central vein down 05/14/23.    Subjective: Seen and examiuned Patient is just done with her dialysis she is alert awake oriented on room air Complaints of  " hurting all over" " I need my pain pill" Denies nausea vomiting  Assessment and Plan: Principal Problem:   SBO (small bowel obstruction) (HCC) Active Problems:   Ischemic disease of gut (HCC)   Partial SBO Abdominal pain Sepsis Acute pancreatitis High output ostomy: Patient presenting with abdominal pain found to have elevated lactic acidosis, febrile with CT abdomen/pelvis consistent with small bowel obstruction and colitis>symptoms continued to progress despite IV antibiotics and conservative measures. S/p diagnostic lap converted to  ex-lap w/ extended ilio-cecectomy (mi ilium to just past cecum) and end ileostomy 05/04/23.  CT 9/26> obtained due to leukocytosis, looked okay some residual small bowel thickening but no large abscess, antibiotics discontinued 9/28 tolerating p.o. having ileostomy output. Continue dressing changes [place] wet-to-dry twice daily x 72 hours 9/13 till 10/3 per CCS and WOCN. Wound VAC removed 9/28. Having high volume ileostomy-measure output closely continue twice daily iron twice daily fiber and 3 times daily Imodium per CCS C. difficile antigen positive, toxin negative. GI PCR panel negative. Monitor temperature curve diet tolerance ostomy output WBC count. Recent Labs  Lab 05/10/23 1932 05/11/23 0232 05/12/23 0354 05/13/23 0807 05/14/23 1057 05/14/23 2227 05/15/23 0844  WBC  --    < > 21.8* 20.8* 14.3* 15.5* 16.5*  LATICACIDVEN 1.5  --   --   --   --   --   --   PROCALCITON  --   --  4.89  --   --   --   --    < > = values in this interval not displayed.     ESRD HD MWF Hypertension/volume: Patient progressed to 10/2, nephrology following.aVF clotted off S/P TDC.IR fistulogram and balloon angioplasty of cephalic vein and central vein down 05/14/23.  Metabolic bone disease of CKD: Continue sevelamer for hyperphosphatemia continue IV VRDRA and resume Sensipar when eating per nephrology  Anemia of ESRD: Continue ESA monitor hemoglobin Recent Labs  Lab 05/12/23 0354 05/13/23 0807 05/14/23 1057 05/14/23 2227 05/15/23 0844  HGB 7.3* 7.6* 7.9* 7.6* 7.3*  HCT 24.1* 25.0* 25.3* 24.6* 23.4*    Hypotension-Intradialytic. Essential hypertension PTA on isosorbide mononitrate 30 mg p.o. daily. Hold due to borderline BP.  Type 1 diabetes mellitus : A1c is a stable. PTA on-Semglee  12 units [place] BID and NovoLog SSI. Cont semglee 5 u, ssi Recent Labs  Lab 05/14/23 1705 05/14/23 2019 05/15/23 0107 05/15/23 0345 05/15/23 0745  GLUCAP 72 98 70 84 84   PVD s/p b/l BKA HLD On  atorvastatin 40 holding for now   Goals of care On 05/08/2023, patient discussed with multiple medical providers and staff that she did not want to continue with aggressive care to include hemodialysis in which she signed off early yesterday.  She wanted to focus more on comfort.  But as of 05/09/2023 she reports ""I want to live" and I will continue hemodialysis at this time.  Seen by palliative care with recommendations of full code, full scope of care at this time.  Pressure injury POA stage II sacrum-see below Pressure Injury 04/30/23 Sacrum Mid Stage 2 -  Partial thickness loss of dermis presenting as a shallow open injury with a red, pink wound bed without slough. 5 cm x 7 cm pink dry (Active)  04/30/23   Location: Sacrum  Location Orientation: Mid  Staging: Stage 2 -  Partial thickness loss of dermis presenting as a shallow open injury with a red, pink wound bed without slough.  Wound Description (Comments): 5 cm x 7 cm pink dry  Present on Admission: Yes  Dressing Type Foam - Lift dressing to assess site every shift 05/12/23 0800    DVT prophylaxis: heparin injection 5,000 Units Start: 04/30/23 0900 Code Status:   Code Status: Full Code Family Communication: plan of care discussed with patient at bedside. Patient status is: Inpatient because of pSBO Level of care: Med-Surg   Dispo: The patient is from: snf            Anticipated disposition: tbd  Objective: Vitals last 24 hrs: Vitals:   05/15/23 1200 05/15/23 1219 05/15/23 1223 05/15/23 1234  BP: (!) 83/53 (!) 98/59 98/63 100/62  Pulse: 68 (P) 89 88 86  Resp: 20 14 12 13   Temp:  (P) 98.2 F (36.8 C)    TempSrc:  (P) Oral    SpO2: 98% 100% 100% 100%  Weight:       Weight change:   Physical Examination: General exam: alert awake, thin, older than stated age HEENT:Oral mucosa moist, Ear/Nose WNL grossly Respiratory system: bilaterally clear BS,no use of accessory muscle Cardiovascular system: S1 & S2 +, No  JVD. Gastrointestinal system: Abdomen soft, ileostomy with thin liquid stool in bag, midline wound present appears pink no drainage, packing present  Nervous System:Alert, awake, moving extremities. Extremities: b/l BKA Skin: No rashes,no icterus. MSK: Normal muscle bulk,tone, power  Medications reviewed:  Scheduled Meds:  Chlorhexidine Gluconate Cloth  6 each Topical Q0600   Chlorhexidine Gluconate Cloth  6 each Topical Q0600   Chlorhexidine Gluconate Cloth  6 each Topical Q0600   Chlorhexidine Gluconate Cloth  6 each Topical Q0600   [START ON 05/17/2023] darbepoetin (ARANESP) injection - DIALYSIS  60 mcg Subcutaneous Q Fri-1800   doxercalciferol  4 mcg Intravenous Q M,W,F-HD   feeding supplement (GLUCERNA SHAKE)  237 mL Oral TID BM   ferrous sulfate  325 mg Oral BID WC   heparin  5,000 Units Subcutaneous Q8H   insulin aspart  0-6 Units Subcutaneous Q4H   loperamide  2 mg Oral TID   pantoprazole  40 mg Oral QHS   polycarbophil  1,250 mg Oral BID   sevelamer carbonate  0.8 g Oral TID WC   sodium hypochlorite   Irrigation BID   Continuous Infusions:  anticoagulant sodium citrate      Diet Order             Diet regular Room service appropriate? Yes; Fluid consistency: Thin  Diet effective now                   Intake/Output Summary (Last 24 hours) at 05/15/2023 1252 Last data filed at 05/15/2023 1224 Gross per 24 hour  Intake --  Output 100 ml  Net -100 ml   Net IO Since Admission: -1,851.65 mL [05/15/23 1252]  Wt Readings from Last 3 Encounters:  03/17/23 59 kg  12/28/22 53.6 kg  07/27/22 84.9 kg     Unresulted Labs (From admission, onward)    None     Data Reviewed: I have personally reviewed following labs and imaging studies CBC: Recent Labs  Lab 05/12/23 0354 05/13/23 0807 05/14/23 1057 05/14/23 2227 05/15/23 0844  WBC 21.8* 20.8* 14.3* 15.5* 16.5*  HGB 7.3* 7.6* 7.9* 7.6* 7.3*  HCT 24.1* 25.0* 25.3* 24.6* 23.4*  MCV 83.4 88.0 85.2 85.7 85.7   PLT 420* 385 365 372 367   Basic Metabolic Panel: Recent Labs  Lab 05/10/23 1950 05/11/23 0232 05/13/23 0807 05/14/23 2228 05/15/23 0844  NA 135 134* 137 134* 133*  K 4.5 3.5 4.1 4.4 4.4  CL 97* 95* 100 93* 92*  CO2 22 28 23 29 26   GLUCOSE 107* 80 173* 109* 76  BUN 41* 14 28* 17 20  CREATININE 6.74* 3.11* 6.02* 4.37* 4.76*  CALCIUM 8.5* 8.3* 8.4* 9.0 8.8*  PHOS 7.0* 3.2 5.7* 4.9* 5.1*   GFR: Estimated Creatinine Clearance: 4.7 mL/min (A) (by C-G formula based on SCr of 4.76 mg/dL (H)). Liver Function Tests: Recent Labs  Lab 05/10/23 1950 05/11/23 0232 05/13/23 0807 05/14/23 2228 05/15/23 0844  ALBUMIN 1.9* 1.9* 1.7* 2.0* 1.9*   Recent Labs  Lab 05/14/23 1057  INR 1.9*  CBG: Recent Labs  Lab 05/14/23 1705 05/14/23 2019 05/15/23 0107 05/15/23 0345 05/15/23 0745  GLUCAP 72 98 70 84 84   Recent Labs  Lab 05/10/23 1932 05/12/23 0354  PROCALCITON  --  4.89  LATICACIDVEN 1.5  --     No results found for this or any previous visit (from the past 240 hour(s)).  Antimicrobials: Anti-infectives (From admission, onward)    Start     Dose/Rate Route Frequency Ordered Stop   05/08/23 2200  cefTRIAXone (ROCEPHIN) 2 g in sodium chloride 0.9 % 100 mL IVPB        2 g 200 mL/hr over 30 Minutes Intravenous Every 24 hours 05/08/23 1613 05/11/23 2232   05/08/23 2200  metroNIDAZOLE (FLAGYL) IVPB 500 mg        500 mg 100 mL/hr over 60 Minutes Intravenous Every 12 hours 05/08/23 1613 05/11/23 2346   05/04/23 0200  cefTRIAXone (ROCEPHIN) 2 g in sodium chloride 0.9 % 100 mL IVPB        2 g 200 mL/hr over 30 Minutes Intravenous Every 24 hours 05/04/23 0102 05/08/23 0144   05/04/23 0200  metroNIDAZOLE (FLAGYL) IVPB 500 mg        500 mg 100 mL/hr over 60 Minutes Intravenous Every 12 hours 05/04/23 0102 05/09/23 1203   04/30/23 1800  ciprofloxacin (CIPRO) IVPB 400 mg  Status:  Discontinued        400 mg 200 mL/hr over 60 Minutes Intravenous Every 24 hours 04/30/23 1443  05/02/23 1140   04/30/23 1515  metroNIDAZOLE (FLAGYL) IVPB 500 mg  Status:  Discontinued  500 mg 100 mL/hr over 60 Minutes Intravenous Every 12 hours 04/30/23 1418 05/02/23 1140      Culture/Microbiology    Component Value Date/Time   SDES BLOOD LEFT HAND 05/03/2023 1305   SPECREQUEST  05/03/2023 1305    AEROBIC BOTTLE ONLY Blood Culture results may not be optimal due to an inadequate volume of blood received in culture bottles   CULT  05/03/2023 1305    NO GROWTH 5 DAYS Performed at [facility], [address] [place], [place] [address]    REPTSTATUS 05/08/2023 FINAL 05/03/2023 1305  Radiology Studies: IR AV DIALY SHUNT INTRO NEEDLE/INTRACATH INITIAL W/PTA/IMG RIGHT  Result Date: 05/14/2023 INDICATION: 42 year old with end-stage renal disease. Unable to cannulate right upper arm fistula due to clotting. Thrombus noted in the proximal to mid upper arm on vascular ultrasound. EXAM: 1. Right upper extremity fistulogram 2. Ultrasound guidance for vascular access 3. Balloon angioplasty of cephalic vein and central veins MEDICATIONS: Versed 0.5 mg ANESTHESIA/SEDATION: The patient's level of consciousness and vital signs were monitored continuously by radiology nursing throughout the procedure under my direct supervision. FLUOROSCOPY: Radiation Exposure Index (as provided by the fluoroscopic device): 15 mGy Kerma CONTRAST:  100 mL Omnipaque 300 COMPLICATIONS: None immediate. PROCEDURE: Informed written consent was obtained from the patient after a thorough discussion of the procedural risks, benefits and alternatives. All questions were addressed. A timeout was performed prior to the initiation of the procedure. Right upper arm was prepped and draped in sterile fashion. Maximal barrier sterile technique was utilized including caps, mask, sterile gowns, sterile gloves, sterile drape, hand hygiene and skin antiseptic. Ultrasound demonstrated a patent brachiocephalic fistula. Cephalic vein  image was saved for documentation. Skin was anesthetized near the elbow using 1% lidocaine. Using ultrasound guidance, 21 gauge needle was directed into the cephalic vein. Needle was directed into the cephalic vein where there was less calcium along the anterior aspect of the wall. Micropuncture dilator set was placed. Right upper extremity fistulogram images were obtained. Micropuncture catheter was exchanged for a 7 [place] vascular sheath over a Bentson wire. Bentson wire was advanced into the SVC using a Kumpe catheter. Right innominate vein, right subclavian vein and central cephalic vein were angioplastied using a 7 mm x 40 mm Conquest balloon. Follow-up fistulogram images were obtained. The right innominate vein and right subclavian vein was subsequently treated with a 10 mm x 40 mm balloon and follow-up fistulogram images were obtained. 7 [place] vascular sheath was removed with a pursestring suture. Hemostasis at the puncture site at the end of the procedure. Bandage was placed at the suture site. Ultrasound was used to mark the skin in the mid humeral region where the partially thrombosed aneurysm is located. This area was marked "NS" for "No Stick". Recommend avoiding this area for dialysis cannulation due to the thrombus within this partially thrombosed aneurysm. FINDINGS: Right upper arm brachiocephalic fistula is patent. Arterial anastomosis is widely patent. The cephalic vein is heavily calcified and there is a large aneurysm formation in the mid humeral region of the cephalic vein. This aneurysm is partially thrombosed based on ultrasound. Suspect that this area is being cannulated during dialysis and the reason that clots were identified. Greater than 50% stenosis in the proximal/central cephalic vein. Stenosis in this area resolved following balloon angioplasty with a 7 mm balloon. Irregularity and narrowing involving the right subclavian vein and right innominate vein. Patient has a left jugular  dialysis catheter that terminates in the right atrium. Stenosis in right  innominate vein and right subclavian appear to be greater than 50%. Following balloon angioplasty with 10 mm balloon, there was markedly improved flow in the right innominate vein. The flow in the right subclavian vein also improved but there are residual densities in the right subclavian vein which may represent calcifications. Residual collateral flow in the right axilla and right lower neck at the end of the procedure. Overall, the flow in the central veins was improved at the end of the procedure. IMPRESSION: 1. Right upper extremity brachiocephalic fistula is patent. Areas of stenosis involving the proximal cephalic vein, right subclavian vein and right innominate vein were successfully treated with balloon angioplasty. 2. 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 This aneurysm is partially thrombosed. Suspect this area is being cannulated during dialysis which is causing problems. This aneurysm area was marked on the skin and recommend avoiding this area for dialysis cannulation. Recommend surgical consultation to evaluate this aneurysm formation. ACCESS: This access remains amenable to future percutaneous interventions as clinically indicated. Electronically Signed   By: Richarda Overlie M.D.   On: 05/14/2023 17:37   IR US Guide Vasc Access Right  Result Date: 05/14/2023 INDICATION: 42 year old with end-stage renal disease. Unable to cannulate right upper arm fistula due to clotting. Thrombus noted in the proximal to mid upper arm on vascular ultrasound. EXAM: 1. Right upper extremity fistulogram 2. Ultrasound guidance for vascular access 3. Balloon angioplasty of cephalic vein and central veins MEDICATIONS: Versed 0.5 mg ANESTHESIA/SEDATION: The patient's level of consciousness and vital signs were monitored continuously by radiology nursing throughout the procedure under my direct supervision. FLUOROSCOPY: Radiation  Exposure Index (as provided by the fluoroscopic device): 15 mGy Kerma CONTRAST:  100 mL Omnipaque 300 COMPLICATIONS: None immediate. PROCEDURE: Informed written consent was obtained from the patient after a thorough discussion of the procedural risks, benefits and alternatives. All questions were addressed. A timeout was performed prior to the initiation of the procedure. Right upper arm was prepped and draped in sterile fashion. Maximal barrier sterile technique was utilized including caps, mask, sterile gowns, sterile gloves, sterile drape, hand hygiene and skin antiseptic. Ultrasound demonstrated a patent brachiocephalic fistula. Cephalic vein image was saved for documentation. Skin was anesthetized near the elbow using 1% lidocaine. Using ultrasound guidance, 21 gauge needle was directed into the cephalic vein. Needle was directed into the cephalic vein where there was less calcium along the anterior aspect of the wall. Micropuncture dilator set was placed. Right upper extremity fistulogram images were obtained. Micropuncture catheter was exchanged for a 7 [place] vascular sheath over a Bentson wire. Bentson wire was advanced into the SVC using a Kumpe catheter. Right innominate vein, right subclavian vein and central cephalic vein were angioplastied using a 7 mm x 40 mm Conquest balloon. Follow-up fistulogram images were obtained. The right innominate vein and right subclavian vein was subsequently treated with a 10 mm x 40 mm balloon and follow-up fistulogram images were obtained. 7 [place] vascular sheath was removed with a pursestring suture. Hemostasis at the puncture site at the end of the procedure. Bandage was placed at the suture site. Ultrasound was used to mark the skin in the mid humeral region where the partially thrombosed aneurysm is located. This area was marked "NS" for "No Stick". Recommend avoiding this area for dialysis cannulation due to the thrombus within this partially thrombosed aneurysm.  FINDINGS: Right upper arm brachiocephalic fistula is patent. Arterial anastomosis is widely patent. The cephalic vein is heavily calcified and there is  a large aneurysm formation in the mid humeral region of the cephalic vein. This aneurysm is partially thrombosed based on ultrasound. Suspect that this area is being cannulated during dialysis and the reason that clots were identified. Greater than 50% stenosis in the proximal/central cephalic vein. Stenosis in this area resolved following balloon angioplasty with a 7 mm balloon. Irregularity and narrowing involving the right subclavian vein and right innominate vein. Patient has a left jugular dialysis catheter that terminates in the right atrium. Stenosis in right innominate vein and right subclavian appear to be greater than 50%. Following balloon angioplasty with 10 mm balloon, there was markedly improved flow in the right innominate vein. The flow in the right subclavian vein also improved but there are residual densities in the right subclavian vein which may represent calcifications. Residual collateral flow in the right axilla and right lower neck at the end of the procedure. Overall, the flow in the central veins was improved at the end of the procedure. IMPRESSION: 1. Right upper extremity brachiocephalic fistula is patent. Areas of stenosis involving the proximal cephalic vein, right subclavian vein and right innominate vein were successfully treated with balloon angioplasty. 2. 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 This aneurysm is partially thrombosed. Suspect this area is being cannulated during dialysis which is causing problems. This aneurysm area was marked on the skin and recommend avoiding this area for dialysis cannulation. Recommend surgical consultation to evaluate this aneurysm formation. ACCESS: This access remains amenable to future percutaneous interventions as clinically indicated. Electronically Signed   By: Richarda Overlie  M.D.   On: 05/14/2023 17:37     LOS: 15 days   Lanae Boast, MD [facility]  05/15/2023, 12:52 PM

## 2023-05-15 NOTE — Progress Notes (Addendum)
Occupational Therapy Treatment Patient Details Name: [name] MRN: [id number] DOB: 08-Dec-1980 Today's Date: 05/15/2023   History of present illness Pt is a 42 y/o F presenting to ED on 9/17 from [facility] with abdominal pain, found to have SBO. S/p laparoscopy, extended ileocecectomy on 9/21. PMH includes bilat AKA, ESRD on HD MWF, HTN, HLD, anemia, CVA, GBS, mesenteric ischemia wiht colonic pneumatosis   OT comments  Pt with slow progression towards goals, able to sit EOB x5 min with max posterior support and pt with strong R lateral lean sitting EOB. Pt able to self-feed with min A, hand over hand assist needed to locate items on food tray. Maximove lift not charged upon inspection, transfers deferred this session. Pt presenting with impairments listed below, will follow acutely. Patient will benefit from continued inpatient follow up therapy, <3 hours/day to maximize safety/ind with ADLs/functional mobility.       If plan is discharge home, recommend the following:  Two people to help with walking and/or transfers;A lot of help with bathing/dressing/bathroom;Assistance with feeding;Assistance with cooking/housework;Direct supervision/assist for medications management;Direct supervision/assist for financial management;Assist for transportation;Help with stairs or ramp for entrance   Equipment Recommendations  Other (comment) (defer)    Recommendations for Other Services      Precautions / Restrictions Precautions Precautions: Fall Precaution Comments: low vision, bilat AKA Restrictions Weight Bearing Restrictions: No       Mobility Bed Mobility Overal bed mobility: Needs Assistance Bed Mobility: Sit to Supine, Supine to Sit     Supine to sit: Max assist, +2 for physical assistance Sit to supine: Total assist, +2 for physical assistance   General bed mobility comments: posterior support with R lateral lean    Transfers                   General transfer  comment: deferred; maximove battery not charged     Balance Overall balance assessment: Needs assistance Sitting-balance support: Bilateral upper extremity supported Sitting balance-Leahy Scale: Poor                                     ADL either performed or assessed with clinical judgement   ADL Overall ADL's : Needs assistance/impaired Eating/Feeding: Minimal assistance Eating/Feeding Details (indicate cue type and reason): at times needing HOH placement to find sandwich on lunch tray                                        Extremity/Trunk Assessment Upper Extremity Assessment Upper Extremity Assessment: Generalized weakness   Lower Extremity Assessment Lower Extremity Assessment: Defer to PT evaluation        Vision   Additional Comments: can only see shadows   Perception Perception Perception: Not tested   Praxis Praxis Praxis: Not tested    Cognition Arousal: Alert Behavior During Therapy: Flat affect Overall Cognitive Status: No family/caregiver present to determine baseline cognitive functioning                                 General Comments: becoming frustrated at times with cues for locating items        Exercises      Shoulder Instructions       General Comments VSS    Pertinent  Vitals/ Pain       Pain Assessment Pain Assessment: Faces Pain Location: back Pain Descriptors / Indicators: Discomfort Pain Intervention(s): Limited activity within patient's tolerance, Monitored during session, Repositioned  Home Living                                          Prior Functioning/Environment              Frequency  Min 1X/week        Progress Toward Goals  OT Goals(current goals can now be found in the care plan section)  Progress towards OT goals: Progressing toward goals  Acute Rehab OT Goals Patient Stated Goal: none stated OT Goal Formulation: With patient Time  For Goal Achievement: 05/21/23 Potential to Achieve Goals: Good ADL Goals Pt Will Perform Eating: with supervision;sitting;bed level Pt Will Perform Grooming: with supervision;sitting;bed level Pt/caregiver will Perform Home Exercise Program: Both right and left upper extremity;With written HEP provided;With Supervision Additional ADL Goal #1: pt will perform bed mobility min A in prep for bed level/seated ADLs  Plan      Co-evaluation                 AM-PAC OT "6 Clicks" Daily Activity     Outcome Measure   Help from another person eating meals?: A Little Help from another person taking care of personal grooming?: A Little Help from another person toileting, which includes using toliet, bedpan, or urinal?: A Lot Help from another person bathing (including washing, rinsing, drying)?: A Lot Help from another person to put on and taking off regular upper body clothing?: A Lot Help from another person to put on and taking off regular lower body clothing?: A Lot 6 Click Score: 14    End of Session    OT Visit Diagnosis: Muscle weakness (generalized) (M62.81);Low vision, both eyes (H54.2)   Activity Tolerance Patient tolerated treatment well   Patient Left in bed;with call bell/phone within reach   Nurse Communication Mobility status        Time: 1410-1434 OT Time Calculation (min): 24 min  Charges: OT General Charges $OT Visit: 1 Visit OT Treatments $Self Care/Home Management : 8-22 mins  Carver Fila, OTD, OTR/L SecureChat Preferred Acute Rehab [phone]   [name] 05/15/2023, 2:53 PM

## 2023-05-15 NOTE — Progress Notes (Signed)
Physical Therapy Treatment Patient Details Name: [name] MRN: [id number] DOB: 1981-03-27 Today's Date: 05/15/2023   History of Present Illness Pt is a 42 y/o F presenting to ED on 9/17 from [facility] with abdominal pain, found to have SBO. S/p laparoscopy, extended ileocecectomy on 9/21. PMH includes bilat AKA, ESRD on HD MWF, HTN, HLD, anemia, CVA, GBS, mesenteric ischemia wiht colonic pneumatosis    PT Comments  Pt greeted resting in bed on arrival and agreeable to session. Pt with slow progress towards acute goals as pt limited by pain and some noted anxiousness and fear of falling with mobility as well as anxiousness with anticipation of pain. Pt needing max-total A +2 to come to sitting EOB. Pt able to briefly maintain sitting balance with hands placed on PTAs knees encouraging anterior weight shift of COG over BOS with CGA for safety. Transfer to chair deferred as maxi-move battery not charged, battery placed on charger and plan to trial lift transfer next session. Pt continues to benefit from skilled PT services to progress toward functional mobility goals.     If plan is discharge home, recommend the following:     Can travel by private vehicle     No  Equipment Recommendations  None recommended by PT    Recommendations for Other Services       Precautions / Restrictions Precautions Precautions: Fall Precaution Comments: low vision, bilat AKA Restrictions Weight Bearing Restrictions: No     Mobility  Bed Mobility Overal bed mobility: Needs Assistance Bed Mobility: Sit to Supine, Supine to Sit     Supine to sit: Max assist, +2 for physical assistance Sit to supine: Total assist, +2 for physical assistance   General bed mobility comments: posterior support with R lateral lean    Transfers                   General transfer comment: deferred; maximove battery not charged    Ambulation/Gait                   Stairs              Wheelchair Mobility     Tilt Bed    Modified Rankin (Stroke Patients Only)       Balance Overall balance assessment: Needs assistance Sitting-balance support: Bilateral upper extremity supported Sitting balance-Leahy Scale: Poor                                      Cognition Arousal: Alert Behavior During Therapy: Flat affect Overall Cognitive Status: No family/caregiver present to determine baseline cognitive functioning                                 General Comments: becoming frustrated at times with cues for locating items        Exercises      General Comments General comments (skin integrity, edema, etc.): VSS      Pertinent Vitals/Pain Pain Assessment Pain Assessment: Faces Faces Pain Scale: Hurts little more Pain Location: "everywhere" Pain Descriptors / Indicators: Discomfort Pain Intervention(s): Monitored during session, Limited activity within patient's tolerance    Home Living                          Prior Function  PT Goals (current goals can now be found in the care plan section) Acute Rehab PT Goals PT Goal Formulation: Patient unable to participate in goal setting Time For Goal Achievement: 05/21/23 Progress towards PT goals: Progressing toward goals    Frequency    Min 1X/week      PT Plan      Co-evaluation PT/OT/SLP Co-Evaluation/Treatment: Yes Reason for Co-Treatment: Complexity of the patient's impairments (multi-system involvement);Necessary to address cognition/behavior during functional activity;To address functional/ADL transfers PT goals addressed during session: Mobility/safety with mobility        AM-PAC PT "6 Clicks" Mobility   Outcome Measure  Help needed turning from your back to your side while in a flat bed without using bedrails?: Total Help needed moving from lying on your back to sitting on the side of a flat bed without using bedrails?:  Total Help needed moving to and from a bed to a chair (including a wheelchair)?: Total Help needed standing up from a chair using your arms (e.g., wheelchair or bedside chair)?: Total Help needed to walk in hospital room?: Total Help needed climbing 3-5 steps with a railing? : Total 6 Click Score: 6    End of Session   Activity Tolerance: Patient limited by pain Patient left: in bed;with call bell/phone within reach;with bed alarm set Nurse Communication: Mobility status PT Visit Diagnosis: Pain;Difficulty in walking, not elsewhere classified (R26.2) Pain - part of body:  (abdomen)     Time: 1410-1434 PT Time Calculation (min) (ACUTE ONLY): 24 min  Charges:    $Therapeutic Activity: 8-22 mins PT General Charges $$ ACUTE PT VISIT: 1 Visit                     Tobi Bastos R. PTA Acute Rehabilitation Services Office: [phone]   Catalina Antigua 05/15/2023, 4:13 PM

## 2023-05-15 NOTE — Progress Notes (Signed)
Received patient in bed to unit.  Alert and oriented.  Informed consent signed and in chart.   TX duration:3.25  Patient tolerated well.  Transported back to the room  Alert, without acute distress.  Hand-off given to patient's nurse.   Access used: right AVF Access issues: none  Total UF removed: 100 ml Medication(s) given: hectorol   05/15/23 1219  Vitals  Temp 98.2 F (36.8 C)  Temp Source Oral  BP (!) 98/59  MAP (mmHg) 70  BP Location Left Arm  BP Method Automatic  Patient Position (if appropriate) Lying  Pulse Rate 87  Pulse Rate Source Monitor  ECG Heart Rate 90  Resp 14  Oxygen Therapy  SpO2 100 %  O2 Device Room Air  During Treatment Monitoring  HD Safety Checks Performed Yes  Intra-Hemodialysis Comments Tx completed (ddi not tolerate tx well today dr Arlean Hopping aware bp was low majority of tx)  Dialysis Fluid Bolus Normal Saline  Bolus Amount (mL) 300 mL    Gorje Iyer S [facility]

## 2023-05-15 NOTE — Hospital Course (Addendum)
42 y.o. female with past medical history significant for type 1 diabetes mellitus with diabetic retinopathy, PVD s/p bilateral BKA, ESRD on HD MWF, HTN, HLD, history of CVA, history of chronic anemia, GBS, mesenteric ischemia with colonic pneumatosis May 2024 treated nonoperatively who presented to [facility] ED on 9/17 from [facility] with acute onset abdominal pain.  Onset day prior to admission associated with diarrhea. In the ED, patient was febrile, tachycardic with lactic acid 1.9 followed by 2.5. Chest x-ray>findings of atelectasis versus infiltrate. CT abdomen/pelvis with contrast>>shows evidence of a small bowel obstruction with dilated ileal segment up to 4 cm, sigmoid segment consistent with colitis, diffuse gastric wall thickening, cholelithiasis with mild chronic prominence of the common bile duct.  General surgery was consulted and admitted for further management of small bowel obstruction  partial Nephrology has been consulted for dialysis aVF clotted off S/P TDC.  IR fistulogram and balloon angioplasty of cephalic vein and central vein down 05/14/23.

## 2023-05-15 NOTE — Progress Notes (Signed)
Patient ID: [name], female   DOB: 1980/09/13, 42 y.o.   MRN: [id number] [facility] Surgery Progress Note  11 Days Post-Op  Subjective: CC-  Seen in dialysis. Abdominal pain about the same. Denies n/v. NPO most of the day yesterday for procedure. Ostomy output not recorded.  Objective: Vital signs in last 24 hours: Temp:  [98.1 F (36.7 C)-98.3 F (36.8 C)] 98.3 F (36.8 C) (10/02 0829) Pulse Rate:  [82-91] 88 (10/02 0915) Resp:  [0-19] 19 (10/02 1030) BP: (73-111)/(43-72) 92/60 (10/02 1030) SpO2:  [81 %-100 %] 100 % (10/02 1030) Weight:  [29 kg] 29 kg (10/02 0818) Last BM Date : 05/14/23  Intake/Output from previous day: No intake/output data recorded. Intake/Output this shift: No intake/output data recorded.  PE: General: NAD Abd: soft, NT, ND, ileostomy viable with about 350cc thin liquid stool in bag, midline wound pale pink with no drainage on packing today  Lab Results:  Recent Labs    05/14/23 2227 05/15/23 0844  WBC 15.5* 16.5*  HGB 7.6* 7.3*  HCT 24.6* 23.4*  PLT 372 367   BMET Recent Labs    05/14/23 2228 05/15/23 0844  NA 134* 133*  K 4.4 4.4  CL 93* 92*  CO2 29 26  GLUCOSE 109* 76  BUN 17 20  CREATININE 4.37* 4.76*  CALCIUM 9.0 8.8*   PT/INR Recent Labs    05/14/23 1057  LABPROT 21.6*  INR 1.9*   CMP     Component Value Date/Time   NA 133 (L) 05/15/2023 0844   NA 133 (L) 05/18/2020 0927   K 4.4 05/15/2023 0844   CL 92 (L) 05/15/2023 0844   CO2 26 05/15/2023 0844   GLUCOSE 76 05/15/2023 0844   BUN 20 05/15/2023 0844   BUN 45 (H) 05/18/2020 0927   CREATININE 4.76 (H) 05/15/2023 0844   CALCIUM 8.8 (L) 05/15/2023 0844   CALCIUM 12.1 (H) 11/13/2021 2026   PROT 6.2 (L) 05/01/2023 1020   PROT 7.6 05/18/2020 0927   ALBUMIN 1.9 (L) 05/15/2023 0844   ALBUMIN 3.9 05/18/2020 0927   AST 26 05/01/2023 1020   ALT 18 05/01/2023 1020   ALKPHOS 72 05/01/2023 1020   BILITOT 0.9 05/01/2023 1020   BILITOT <0.2 05/18/2020 0927    GFRNONAA 11 (L) 05/15/2023 0844   GFRAA 6 (L) 05/18/2020 0927   Lipase     Component Value Date/Time   LIPASE 40 12/20/2022 0918       Studies/Results: IR AV DIALY SHUNT INTRO NEEDLE/INTRACATH INITIAL W/PTA/IMG RIGHT  Result Date: 05/14/2023 INDICATION: 42 year old with end-stage renal disease. Unable to cannulate right upper arm fistula due to clotting. Thrombus noted in the proximal to mid upper arm on vascular ultrasound. EXAM: 1. Right upper extremity fistulogram 2. Ultrasound guidance for vascular access 3. Balloon angioplasty of cephalic vein and central veins MEDICATIONS: Versed 0.5 mg ANESTHESIA/SEDATION: The patient's level of consciousness and vital signs were monitored continuously by radiology nursing throughout the procedure under my direct supervision. FLUOROSCOPY: Radiation Exposure Index (as provided by the fluoroscopic device): 15 mGy Kerma CONTRAST:  100 mL Omnipaque 300 COMPLICATIONS: None immediate. PROCEDURE: Informed written consent was obtained from the patient after a thorough discussion of the procedural risks, benefits and alternatives. All questions were addressed. A timeout was performed prior to the initiation of the procedure. Right upper arm was prepped and draped in sterile fashion. Maximal barrier sterile technique was utilized including caps, mask, sterile gowns, sterile gloves, sterile drape, hand hygiene and skin antiseptic. Ultrasound demonstrated  a patent brachiocephalic fistula. Cephalic vein image was saved for documentation. Skin was anesthetized near the elbow using 1% lidocaine. Using ultrasound guidance, 21 gauge needle was directed into the cephalic vein. Needle was directed into the cephalic vein where there was less calcium along the anterior aspect of the wall. Micropuncture dilator set was placed. Right upper extremity fistulogram images were obtained. Micropuncture catheter was exchanged for a 7 [place] vascular sheath over a Bentson wire. Bentson wire  was advanced into the SVC using a Kumpe catheter. Right innominate vein, right subclavian vein and central cephalic vein were angioplastied using a 7 mm x 40 mm Conquest balloon. Follow-up fistulogram images were obtained. The right innominate vein and right subclavian vein was subsequently treated with a 10 mm x 40 mm balloon and follow-up fistulogram images were obtained. 7 [place] vascular sheath was removed with a pursestring suture. Hemostasis at the puncture site at the end of the procedure. Bandage was placed at the suture site. Ultrasound was used to mark the skin in the mid humeral region where the partially thrombosed aneurysm is located. This area was marked "NS" for "No Stick". Recommend avoiding this area for dialysis cannulation due to the thrombus within this partially thrombosed aneurysm. FINDINGS: Right upper arm brachiocephalic fistula is patent. Arterial anastomosis is widely patent. The cephalic vein is heavily calcified and there is a large aneurysm formation in the mid humeral region of the cephalic vein. This aneurysm is partially thrombosed based on ultrasound. Suspect that this area is being cannulated during dialysis and the reason that clots were identified. Greater than 50% stenosis in the proximal/central cephalic vein. Stenosis in this area resolved following balloon angioplasty with a 7 mm balloon. Irregularity and narrowing involving the right subclavian vein and right innominate vein. Patient has a left jugular dialysis catheter that terminates in the right atrium. Stenosis in right innominate vein and right subclavian appear to be greater than 50%. Following balloon angioplasty with 10 mm balloon, there was markedly improved flow in the right innominate vein. The flow in the right subclavian vein also improved but there are residual densities in the right subclavian vein which may represent calcifications. Residual collateral flow in the right axilla and right lower neck at the end  of the procedure. Overall, the flow in the central veins was improved at the end of the procedure. IMPRESSION: 1. Right upper extremity brachiocephalic fistula is patent. Areas of stenosis involving the proximal cephalic vein, right subclavian vein and right innominate vein were successfully treated with balloon angioplasty. 2. 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 This aneurysm is partially thrombosed. Suspect this area is being cannulated during dialysis which is causing problems. This aneurysm area was marked on the skin and recommend avoiding this area for dialysis cannulation. Recommend surgical consultation to evaluate this aneurysm formation. ACCESS: This access remains amenable to future percutaneous interventions as clinically indicated. Electronically Signed   By: Richarda Overlie M.D.   On: 05/14/2023 17:37   IR US Guide Vasc Access Right  Result Date: 05/14/2023 INDICATION: 42 year old with end-stage renal disease. Unable to cannulate right upper arm fistula due to clotting. Thrombus noted in the proximal to mid upper arm on vascular ultrasound. EXAM: 1. Right upper extremity fistulogram 2. Ultrasound guidance for vascular access 3. Balloon angioplasty of cephalic vein and central veins MEDICATIONS: Versed 0.5 mg ANESTHESIA/SEDATION: The patient's level of consciousness and vital signs were monitored continuously by radiology nursing throughout the procedure under my direct supervision. FLUOROSCOPY: Radiation  Exposure Index (as provided by the fluoroscopic device): 15 mGy Kerma CONTRAST:  100 mL Omnipaque 300 COMPLICATIONS: None immediate. PROCEDURE: Informed written consent was obtained from the patient after a thorough discussion of the procedural risks, benefits and alternatives. All questions were addressed. A timeout was performed prior to the initiation of the procedure. Right upper arm was prepped and draped in sterile fashion. Maximal barrier sterile technique was utilized  including caps, mask, sterile gowns, sterile gloves, sterile drape, hand hygiene and skin antiseptic. Ultrasound demonstrated a patent brachiocephalic fistula. Cephalic vein image was saved for documentation. Skin was anesthetized near the elbow using 1% lidocaine. Using ultrasound guidance, 21 gauge needle was directed into the cephalic vein. Needle was directed into the cephalic vein where there was less calcium along the anterior aspect of the wall. Micropuncture dilator set was placed. Right upper extremity fistulogram images were obtained. Micropuncture catheter was exchanged for a 7 [place] vascular sheath over a Bentson wire. Bentson wire was advanced into the SVC using a Kumpe catheter. Right innominate vein, right subclavian vein and central cephalic vein were angioplastied using a 7 mm x 40 mm Conquest balloon. Follow-up fistulogram images were obtained. The right innominate vein and right subclavian vein was subsequently treated with a 10 mm x 40 mm balloon and follow-up fistulogram images were obtained. 7 [place] vascular sheath was removed with a pursestring suture. Hemostasis at the puncture site at the end of the procedure. Bandage was placed at the suture site. Ultrasound was used to mark the skin in the mid humeral region where the partially thrombosed aneurysm is located. This area was marked "NS" for "No Stick". Recommend avoiding this area for dialysis cannulation due to the thrombus within this partially thrombosed aneurysm. FINDINGS: Right upper arm brachiocephalic fistula is patent. Arterial anastomosis is widely patent. The cephalic vein is heavily calcified and there is a large aneurysm formation in the mid humeral region of the cephalic vein. This aneurysm is partially thrombosed based on ultrasound. Suspect that this area is being cannulated during dialysis and the reason that clots were identified. Greater than 50% stenosis in the proximal/central cephalic vein. Stenosis in this area  resolved following balloon angioplasty with a 7 mm balloon. Irregularity and narrowing involving the right subclavian vein and right innominate vein. Patient has a left jugular dialysis catheter that terminates in the right atrium. Stenosis in right innominate vein and right subclavian appear to be greater than 50%. Following balloon angioplasty with 10 mm balloon, there was markedly improved flow in the right innominate vein. The flow in the right subclavian vein also improved but there are residual densities in the right subclavian vein which may represent calcifications. Residual collateral flow in the right axilla and right lower neck at the end of the procedure. Overall, the flow in the central veins was improved at the end of the procedure. IMPRESSION: 1. Right upper extremity brachiocephalic fistula is patent. Areas of stenosis involving the proximal cephalic vein, right subclavian vein and right innominate vein were successfully treated with balloon angioplasty. 2. 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 This aneurysm is partially thrombosed. Suspect this area is being cannulated during dialysis which is causing problems. This aneurysm area was marked on the skin and recommend avoiding this area for dialysis cannulation. Recommend surgical consultation to evaluate this aneurysm formation. ACCESS: This access remains amenable to future percutaneous interventions as clinically indicated. Electronically Signed   By: Richarda Overlie M.D.   On: 05/14/2023 17:37  Anti-infectives: Anti-infectives (From admission, onward)    Start     Dose/Rate Route Frequency Ordered Stop   05/08/23 2200  cefTRIAXone (ROCEPHIN) 2 g in sodium chloride 0.9 % 100 mL IVPB        2 g 200 mL/hr over 30 Minutes Intravenous Every 24 hours 05/08/23 1613 05/11/23 2232   05/08/23 2200  metroNIDAZOLE (FLAGYL) IVPB 500 mg        500 mg 100 mL/hr over 60 Minutes Intravenous Every 12 hours 05/08/23 1613 05/11/23 2346    05/04/23 0200  cefTRIAXone (ROCEPHIN) 2 g in sodium chloride 0.9 % 100 mL IVPB        2 g 200 mL/hr over 30 Minutes Intravenous Every 24 hours 05/04/23 0102 05/08/23 0144   05/04/23 0200  metroNIDAZOLE (FLAGYL) IVPB 500 mg        500 mg 100 mL/hr over 60 Minutes Intravenous Every 12 hours 05/04/23 0102 05/09/23 1203   04/30/23 1800  ciprofloxacin (CIPRO) IVPB 400 mg  Status:  Discontinued        400 mg 200 mL/hr over 60 Minutes Intravenous Every 24 hours 04/30/23 1443 05/02/23 1140   04/30/23 1515  metroNIDAZOLE (FLAGYL) IVPB 500 mg  Status:  Discontinued        500 mg 100 mL/hr over 60 Minutes Intravenous Every 12 hours 04/30/23 1418 05/02/23 1140        Assessment/Plan 42 yo female presenting with abdominal pain, diarrhea, low grade fevers, dilated small bowel and colonic wall thickening on CT   POD#11 s/p diagnostic lap converted to ex-lap with extended ileo-cecectomy (mid-ileum to just past cecum), 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 Hillery Hunter - Intra-op findings were notable for adhesions in the RLQ w/ associated twisting of the mesentery resulting in SB ischemia. - CT 9/26 ordered due to leukocytosis but overall looks ok, some residual small bowel thickening but no large abscess. Off IV abx as of 9/28. Patient tolerating PO and having ileostomy output. - Dakin's wet to dry dressings BID x72 hrs (started 9/30, will end 10/3) - WOC RN following for new ileostomy.  - Has had high volume ileostomy output. Output not recorded last 24 hours. Will reach out to RN to verify. Continue BID iron, BID fiber, and TID imodium for now - mobilize, continue therapies - rec SNF   FEN: reg, Glucerna ID: Rocephin/Flagyl 9/21 > 9/28 VTE: SCDs, SQH q 8h Foley: out    LOS: 15 days    Franne Forts, [facility] Surgery 05/15/2023, 10:37 AM Please see Amion for pager number during day hours 7:00am-4:30pm

## 2023-05-15 NOTE — Plan of Care (Signed)
  Problem: Coping: Goal: Ability to adjust to condition or change in health will improve Outcome: Progressing   Problem: Fluid Volume: Goal: Ability to maintain a balanced intake and output will improve Outcome: Progressing   Problem: Health Behavior/Discharge Planning: Goal: Ability to identify and utilize available resources and services will improve Outcome: Progressing Goal: Ability to manage health-related needs will improve Outcome: Progressing   Problem: Metabolic: Goal: Ability to maintain appropriate glucose levels will improve Outcome: Progressing   Problem: Nutritional: Goal: Maintenance of adequate nutrition will improve Outcome: Progressing Goal: Progress toward achieving an optimal weight will improve Outcome: Progressing   Problem: Skin Integrity: Goal: Risk for impaired skin integrity will decrease Outcome: Progressing   Problem: Tissue Perfusion: Goal: Adequacy of tissue perfusion will improve Outcome: Progressing   Problem: Education: Goal: Knowledge of General Education information will improve Description: Including pain rating scale, medication(s)/side effects and non-pharmacologic comfort measures Outcome: Progressing   Problem: Health Behavior/Discharge Planning: Goal: Ability to manage health-related needs will improve Outcome: Progressing   Problem: Clinical Measurements: Goal: Ability to maintain clinical measurements within normal limits will improve Outcome: Progressing Goal: Will remain free from infection Outcome: Progressing Goal: Diagnostic test results will improve Outcome: Progressing Goal: Respiratory complications will improve Outcome: Progressing Goal: Cardiovascular complication will be avoided Outcome: Progressing   Problem: Activity: Goal: Risk for activity intolerance will decrease Outcome: Progressing   Problem: Nutrition: Goal: Adequate nutrition will be maintained Outcome: Progressing   Problem: Coping: Goal:  Level of anxiety will decrease Outcome: Progressing   Problem: Elimination: Goal: Will not experience complications related to bowel motility Outcome: Progressing Goal: Will not experience complications related to urinary retention Outcome: Progressing   Problem: Pain Managment: Goal: General experience of comfort will improve Outcome: Progressing   Problem: Safety: Goal: Ability to remain free from injury will improve Outcome: Progressing   Problem: Skin Integrity: Goal: Risk for impaired skin integrity will decrease Outcome: Progressing   Problem: Education: Goal: Knowledge of disease and its progression will improve Outcome: Progressing   Problem: Fluid Volume: Goal: Compliance with measures to maintain balanced fluid volume will improve Outcome: Progressing   Problem: Health Behavior/Discharge Planning: Goal: Ability to manage health-related needs will improve Outcome: Progressing   Problem: Nutritional: Goal: Ability to make healthy dietary choices will improve Outcome: Progressing   Problem: Clinical Measurements: Goal: Complications related to the disease process, condition or treatment will be avoided or minimized Outcome: Progressing

## 2023-05-15 NOTE — Progress Notes (Signed)
[facility] NEPHROLOGY PROGRESS NOTE  Subjective: Seen in HD unit. No c/o's.   Objective Vital signs in last 24 hours: Vitals:   05/13/23 0107 05/13/23 0549 05/13/23 0705 05/13/23 0730  BP: 103/66 98/63  92/60  Pulse: 89 89  78  Resp: 18 18  18   Temp: 98.2 F (36.8 C) 98.2 F (36.8 C)    TempSrc: Oral Oral    SpO2:      Weight:   19 kg    Physical Exam: General: Not in distress.  Lying comfortable. Heart:RRR, s1s2 nl Lungs:clear b/l, no crackle Abdomen: Soft, nondistended Extremities: Bilateral BKA Dialysis Access: Right AV fistula has faint thrill  OP HD: [facility] MWF  3h  400/1.5  49kg   R AVF  Hep none - last OP HD 9/16, post wt 51kg, coming off 2- 4kg over the last 3 wks - mircera IV q 2 wks, last 9/13, due 9/27 - hectorol 4 mcg IV three times per week.   Assessment/ Plan:  # Abdominal pain - partial SBO s/p ex-lap with extended ileo-cecectomy with end ileostomy. Per primary/Gen surgery.   #Sepsis 2/2 to ischemic Colitis - completed IV abx on 9/28. Per pmd.   # ESRD - on HD MWF. HD today on schedule.   # HD access - we were unable to cannulate AV fistula on 9/27 because of pulling clots.  IR placed temporary HD catheter and pt received dialysis on 9/27.  Duplex US of AVF showed low blood flow, vascular surgeon and IR were consulted.  Pt went for fistulogram 10/1 by IR. There was a R cephalic stenosis treated w/ venoplasty, and a central stenosis also rx'd w/ venoplasty. 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 This area was marked and we are not to stick within the circular marking. The rest of the AVF is good to stick.    # HTN/Volume - no vol excess on exam. Wt's are off, will request new bed. Not tolerating any fluid removal today. Keep even.   # Anemia due to ESRD, continue ESA.  Monitor hemoglobin.  # CKD-MBD - sevelamer for hyperphosphatemia.  Cont IV vdra.  Resume sensipar when eating.   #PAD - bilat AKA.    Rob Tax adviser  MD  [facility] 05/15/2023, 1:32 PM  Recent Labs  Lab 05/14/23 2227 05/14/23 2228 05/15/23 0844  HGB 7.6*  --  7.3*  ALBUMIN  --  2.0* 1.9*  CALCIUM  --  9.0 8.8*  PHOS  --  4.9* 5.1*  CREATININE  --  4.37* 4.76*  K  --  4.4 4.4    Inpatient medications:  Chlorhexidine Gluconate Cloth  6 each Topical Q0600   Chlorhexidine Gluconate Cloth  6 each Topical Q0600   Chlorhexidine Gluconate Cloth  6 each Topical Q0600   Chlorhexidine Gluconate Cloth  6 each Topical Q0600   [START ON 05/17/2023] darbepoetin (ARANESP) injection - DIALYSIS  60 mcg Subcutaneous Q Fri-1800   doxercalciferol  4 mcg Intravenous Q M,W,F-HD   feeding supplement (GLUCERNA SHAKE)  237 mL Oral TID BM   ferrous sulfate  325 mg Oral BID WC   heparin  5,000 Units Subcutaneous Q8H   insulin aspart  0-6 Units Subcutaneous Q4H   loperamide  2 mg Oral TID   pantoprazole  40 mg Oral QHS   polycarbophil  1,250 mg Oral BID   sevelamer carbonate  0.8 g Oral TID  WC   sodium hypochlorite   Irrigation BID     acetaminophen **OR** acetaminophen, albuterol, hydrALAZINE, HYDROcodone-acetaminophen, HYDROmorphone (DILAUDID) injection, ondansetron **OR** ondansetron (ZOFRAN) IV

## 2023-05-16 DIAGNOSIS — K56609 Unspecified intestinal obstruction, unspecified as to partial versus complete obstruction: Secondary | ICD-10-CM | POA: Diagnosis not present

## 2023-05-16 LAB — GLUCOSE, CAPILLARY
Glucose-Capillary: 123 mg/dL — ABNORMAL HIGH (ref 70–99)
Glucose-Capillary: 159 mg/dL — ABNORMAL HIGH (ref 70–99)
Glucose-Capillary: 160 mg/dL — ABNORMAL HIGH (ref 70–99)
Glucose-Capillary: 91 mg/dL (ref 70–99)
Glucose-Capillary: 93 mg/dL (ref 70–99)
Glucose-Capillary: 94 mg/dL (ref 70–99)

## 2023-05-16 LAB — BASIC METABOLIC PANEL
Anion gap: 13 (ref 5–15)
BUN: 15 mg/dL (ref 6–20)
CO2: 28 mmol/L (ref 22–32)
Calcium: 9.2 mg/dL (ref 8.9–10.3)
Chloride: 94 mmol/L — ABNORMAL LOW (ref 98–111)
Creatinine, Ser: 4.34 mg/dL — ABNORMAL HIGH (ref 0.44–1.00)
GFR, Estimated: 12 mL/min — ABNORMAL LOW (ref >60)
Glucose, Bld: 120 mg/dL — ABNORMAL HIGH (ref 70–99)
Potassium: 4.7 mmol/L (ref 3.5–5.1)
Sodium: 135 mmol/L (ref 135–145)

## 2023-05-16 LAB — CBC
HCT: 24.1 % — ABNORMAL LOW (ref 36.0–46.0)
Hemoglobin: 7.3 g/dL — ABNORMAL LOW (ref 12.0–15.0)
MCH: 25.8 pg — ABNORMAL LOW (ref 26.0–34.0)
MCHC: 30.3 g/dL (ref 30.0–36.0)
MCV: 85.2 fL (ref 80.0–100.0)
Platelets: 305 10*3/uL (ref 150–400)
RBC: 2.83 MIL/uL — ABNORMAL LOW (ref 3.87–5.11)
RDW: 19.8 % — ABNORMAL HIGH (ref 11.5–15.5)
WBC: 15.8 10*3/uL — ABNORMAL HIGH (ref 4.0–10.5)
nRBC: 0 % (ref 0.0–0.2)

## 2023-05-16 MED ORDER — ENSURE ENLIVE PO LIQD
237.0000 mL | Freq: Three times a day (TID) | ORAL | Status: DC
  Administered 2023-05-16 – 2023-05-20 (×3): 237 mL via ORAL

## 2023-05-16 MED ORDER — INSULIN ASPART 100 UNIT/ML IJ SOLN
0.0000 [IU] | Freq: Four times a day (QID) | INTRAMUSCULAR | Status: DC
  Administered 2023-05-16 – 2023-05-17 (×2): 1 [IU] via SUBCUTANEOUS

## 2023-05-16 MED ORDER — CHLORHEXIDINE GLUCONATE CLOTH 2 % EX PADS
6.0000 | MEDICATED_PAD | Freq: Every day | CUTANEOUS | Status: DC
  Administered 2023-05-17 – 2023-05-18 (×2): 6 via TOPICAL

## 2023-05-16 MED ORDER — DRONABINOL 2.5 MG PO CAPS
5.0000 mg | ORAL_CAPSULE | Freq: Two times a day (BID) | ORAL | Status: DC
  Administered 2023-05-16 – 2023-05-24 (×14): 5 mg via ORAL
  Filled 2023-05-16 (×14): qty 2

## 2023-05-16 NOTE — Progress Notes (Signed)
[facility] NEPHROLOGY PROGRESS NOTE  Subjective: Seen in room, no c/o' today. BP's have been in 90s- 100s.   Objective Vital signs in last 24 hours: Vitals:   05/13/23 0107 05/13/23 0549 05/13/23 0705 05/13/23 0730  BP: 103/66 98/63  92/60  Pulse: 89 89  78  Resp: 18 18  18   Temp: 98.2 F (36.8 C) 98.2 F (36.8 C)    TempSrc: Oral Oral    SpO2:      Weight:   19 kg    Physical Exam: General: Not in distress.  Lying comfortable. Heart:RRR, s1s2 nl Lungs:clear b/l, no crackle Abdomen: Soft, nondistended Extremities: Bilateral BKA Dialysis Access: Right AV fistula has faint thrill  OP HD: [facility] MWF  3h  400/1.5  49kg   R AVF  Hep none - last OP HD 9/16, post wt 51kg, coming off 2- 4kg over the last 3 wks - mircera IV q 2 wks, last 9/13, due 9/27 - hectorol 4 mcg IV three times per week.   Assessment/ Plan:  # Abdominal pain - partial SBO s/p ex-lap with extended ileo-cecectomy with end ileostomy. Per primary/Gen surgery.   #Sepsis 2/2 to ischemic Colitis - completed IV abx on 9/28. Per pmd.   # ESRD - on HD MWF. HD tomorrow.   # HD access - we were unable to cannulate AV fistula on 9/27 because of pulling clots.  IR placed temporary HD catheter and pt received dialysis on 9/27.  Duplex US of AVF showed low blood flow, vascular surgeon and IR were consulted.  Pt went for fistulogram 10/1 by IR. There was a R cephalic stenosis treated w/ venoplasty, and a central stenosis also rx'd w/ venoplasty. 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 This area was marked and we are not to stick within the circular marking. The rest of the AVF is good to stick; did well w/ HD on 10/02.   # HTN/Volume - no vol excess on exam. Wt's are down, but doubt accuracy. She has had sig diarrheal losses though while here. Did not tolerate fluid removal attempt w/ last HD. Keep even. Will start her  on some IVF"s overnight, probably dry.   # Anemia due to ESRD, continue ESA.  Monitor hemoglobin.  # CKD-MBD - sevelamer for hyperphosphatemia.  Cont IV vdra. Resume sensipar when eating.   #PAD - bilat AKA.    Vinson Moselle  MD  [facility] 05/16/2023, 3:03 PM  Recent Labs  Lab 05/14/23 2227 05/14/23 2228 05/15/23 0844  HGB 7.6*  --  7.3*  ALBUMIN  --  2.0* 1.9*  CALCIUM  --  9.0 8.8*  PHOS  --  4.9* 5.1*  CREATININE  --  4.37* 4.76*  K  --  4.4 4.4    Inpatient medications:  Chlorhexidine Gluconate Cloth  6 each Topical Q0600   [START ON 05/17/2023] darbepoetin (ARANESP) injection - DIALYSIS  60 mcg Subcutaneous Q Fri-1800   doxercalciferol  4 mcg Intravenous Q M,W,F-HD   dronabinol  5 mg Oral BID AC   feeding supplement  237 mL Oral TID BM   ferrous sulfate  325 mg Oral BID WC   heparin  5,000 Units Subcutaneous Q8H   insulin aspart  0-6 Units Subcutaneous Q6H   loperamide  2 mg Oral TID   pantoprazole  40 mg Oral QHS   polycarbophil  1,250 mg Oral BID   sevelamer  carbonate  0.8 g Oral TID WC     acetaminophen **OR** acetaminophen, albuterol, hydrALAZINE, HYDROcodone-acetaminophen, HYDROmorphone (DILAUDID) injection, ondansetron **OR** ondansetron (ZOFRAN) IV

## 2023-05-16 NOTE — Progress Notes (Signed)
Initial Nutrition Assessment  DOCUMENTATION CODES:   Non-severe (moderate) malnutrition in context of chronic illness  INTERVENTION:  Ensure TID    NUTRITION DIAGNOSIS:   Moderate Malnutrition related to chronic illness as evidenced by energy intake < or equal to 50% for > or equal to 5 days, moderate fat depletion, moderate muscle depletion.    GOAL:   Patient will meet greater than or equal to 90% of their needs    MONITOR:   PO intake, Supplement acceptance, Weight trends, Labs  REASON FOR ASSESSMENT:   Consult Poor PO  ASSESSMENT:    42 y.o. female with past medical history significant for type 1 diabetes mellitus with diabetic retinopathy, PVD s/p bilateral BKA, ESRD on HD MWF, HTN, HLD, history of CVA, history of chronic anemia, GBS, mesenteric ischemia with colonic pneumatosis May 2024 treated nonoperatively. Admitted 9/17 from SNF with SOB. Poor intake, small bowel obstruction, and colitis. HD dependent. Marinol initiated 10/3 9/21-  S/p diagnostic lap converted to ex-lap w/ extended ilio-cecectomy (mi ilium to just past cecum) and end ileostomy  9-26- CT 9/28 Antibiotics discontinued.  Pt sleeping upon entry into room. RD attempt to wake she opened eyes. Shook head yes when asked if she minded me to assess her fat and muscle. Tried to engage in conversation with pt but she kept falling asleep and would not answer questions. RN weighed pt in bed today although reported that the bed had not been zeroed out.  Visual of pt she does not appear to be the weight that is documented. Suspect erroneous weight entry. Due to poor intake Pt would better benefit from Ensure instead of Glucerna.  Admit weight: unknown Current weight: 29kg   Weight history; 03/17/23 59 kg  12/28/22 53.6 kg  07/27/22 84.9 kg  07/04/22 48.1 kg    Average Meal Intake: 0-100: 30% intake x 5 recorded meals. Accuracy is questionable related to gaps in documentation.   Nutritionally Relevant  Medications: Scheduled Meds:  [START ON 05/17/2023] darbepoetin (ARANESP) injection - DIALYSIS  60 mcg Subcutaneous Q Fri-1800   doxercalciferol  4 mcg Intravenous Q M,W,F-HD   dronabinol  5 mg Oral BID AC   feeding supplement (GLUCERNA SHAKE)  237 mL Oral TID BM   ferrous sulfate  325 mg Oral BID WC   insulin aspart  0-6 Units Subcutaneous Q6H   loperamide  2 mg Oral TID   polycarbophil  1,250 mg Oral BID   sevelamer carbonate  0.8 g Oral TID WC     PRN Meds:.acetaminophen **OR** acetaminophen, albuterol, hydrALAZINE, HYDROcodone-acetaminophen, HYDROmorphone (DILAUDID) injection, ondansetron **OR** ondansetron (ZOFRAN) IV  Labs Reviewed:  CBG ranges from 76-109 mg/dL over the last 24 hours HgbA1c 5.7    NUTRITION - FOCUSED PHYSICAL EXAM:  Flowsheet Row Most Recent Value  Orbital Region Mild depletion  Upper Arm Region Moderate depletion  Thoracic and Lumbar Region Moderate depletion  Buccal Region Mild depletion  Temple Region Mild depletion  Clavicle Bone Region Moderate depletion  Clavicle and Acromion Bone Region Moderate depletion  Scapular Bone Region Unable to assess  Dorsal Hand Unable to assess  Patellar Region Unable to assess  Anterior Thigh Region Unable to assess  Posterior Calf Region Unable to assess  Edema (RD Assessment) None  Hair Reviewed  Eyes Reviewed  Mouth Unable to assess  Skin Reviewed  Nails Reviewed       Diet Order:   Diet Order             Diet regular  Room service appropriate? Yes; Fluid consistency: Thin  Diet effective now                   EDUCATION NEEDS:   Not appropriate for education at this time  Skin:  Skin Assessment: Skin Integrity Issues: Skin Integrity Issues:: Stage II, Incisions Stage II: 9/17 sacrum Incisions: 9/21 abdomin  Last BM:  10/3  Height:   Ht Readings from Last 1 Encounters:  03/17/23 5\' 1"  (1.549 m)    Weight:   Wt Readings from Last 1 Encounters:  05/16/23 29 kg    Ideal Body  Weight:     BMI:  Body mass index is 12.08 kg/m.  Estimated Nutritional Needs:   Kcal:  1700-2000 kcal  Protein:  80-90 g  Fluid:  >/=1540ml    Jamelle Haring RDN, LDN Clinical Dietitian  RDN pager # available on Amion

## 2023-05-16 NOTE — Progress Notes (Signed)
Patient refused lab draw.

## 2023-05-16 NOTE — Progress Notes (Signed)
Patient ID: [name], female   DOB: 1980/08/16, 42 y.o.   MRN: [id number] [facility] Surgery Progress Note  12 Days Post-Op  Subjective: CC-  Tolerating diet but only eating small bites here and there. Refusing protein shakes. Denies any nausea or vomiting and states that abdominal pain is stable. 800cc output from ileostomy last 24hr.  Objective: Vital signs in last 24 hours: Temp:  [98.1 F (36.7 C)-99.4 F (37.4 C)] 99.4 F (37.4 C) (10/03 0841) Pulse Rate:  [67-119] 90 (10/03 0841) Resp:  [9-20] 17 (10/03 0841) BP: (72-103)/(43-69) 96/60 (10/03 0841) SpO2:  [68 %-100 %] 68 % (10/02 2033) Last BM Date : 05/16/23  Intake/Output from previous day: 10/02 0701 - 10/03 0700 In: 398 [P.O.:398] Out: 900 [Stool:800] Intake/Output this shift: No intake/output data recorded.  PE: General: NAD Abd: soft, NT, ND, ileostomy viable with about some thicker stool in pouch as well as thin bilious output, cdi dressing to midline wound - pt refuses to let me take this down this morning  Lab Results:  Recent Labs    05/14/23 2227 05/15/23 0844  WBC 15.5* 16.5*  HGB 7.6* 7.3*  HCT 24.6* 23.4*  PLT 372 367   BMET Recent Labs    05/14/23 2228 05/15/23 0844  NA 134* 133*  K 4.4 4.4  CL 93* 92*  CO2 29 26  GLUCOSE 109* 76  BUN 17 20  CREATININE 4.37* 4.76*  CALCIUM 9.0 8.8*   PT/INR Recent Labs    05/14/23 1057  LABPROT 21.6*  INR 1.9*   CMP     Component Value Date/Time   NA 133 (L) 05/15/2023 0844   NA 133 (L) 05/18/2020 0927   K 4.4 05/15/2023 0844   CL 92 (L) 05/15/2023 0844   CO2 26 05/15/2023 0844   GLUCOSE 76 05/15/2023 0844   BUN 20 05/15/2023 0844   BUN 45 (H) 05/18/2020 0927   CREATININE 4.76 (H) 05/15/2023 0844   CALCIUM 8.8 (L) 05/15/2023 0844   CALCIUM 12.1 (H) 11/13/2021 2026   PROT 6.2 (L) 05/01/2023 1020   PROT 7.6 05/18/2020 0927   ALBUMIN 1.9 (L) 05/15/2023 0844   ALBUMIN 3.9 05/18/2020 0927   AST 26 05/01/2023 1020   ALT 18  05/01/2023 1020   ALKPHOS 72 05/01/2023 1020   BILITOT 0.9 05/01/2023 1020   BILITOT <0.2 05/18/2020 0927   GFRNONAA 11 (L) 05/15/2023 0844   GFRAA 6 (L) 05/18/2020 0927   Lipase     Component Value Date/Time   LIPASE 40 12/20/2022 0918       Studies/Results: IR AV DIALY SHUNT INTRO NEEDLE/INTRACATH INITIAL W/PTA/IMG RIGHT  Result Date: 05/14/2023 INDICATION: 42 year old with end-stage renal disease. Unable to cannulate right upper arm fistula due to clotting. Thrombus noted in the proximal to mid upper arm on vascular ultrasound. EXAM: 1. Right upper extremity fistulogram 2. Ultrasound guidance for vascular access 3. Balloon angioplasty of cephalic vein and central veins MEDICATIONS: Versed 0.5 mg ANESTHESIA/SEDATION: The patient's level of consciousness and vital signs were monitored continuously by radiology nursing throughout the procedure under my direct supervision. FLUOROSCOPY: Radiation Exposure Index (as provided by the fluoroscopic device): 15 mGy Kerma CONTRAST:  100 mL Omnipaque 300 COMPLICATIONS: None immediate. PROCEDURE: Informed written consent was obtained from the patient after a thorough discussion of the procedural risks, benefits and alternatives. All questions were addressed. A timeout was performed prior to the initiation of the procedure. Right upper arm was prepped and draped in sterile fashion. Maximal barrier sterile  technique was utilized including caps, mask, sterile gowns, sterile gloves, sterile drape, hand hygiene and skin antiseptic. Ultrasound demonstrated a patent brachiocephalic fistula. Cephalic vein image was saved for documentation. Skin was anesthetized near the elbow using 1% lidocaine. Using ultrasound guidance, 21 gauge needle was directed into the cephalic vein. Needle was directed into the cephalic vein where there was less calcium along the anterior aspect of the wall. Micropuncture dilator set was placed. Right upper extremity fistulogram images were  obtained. Micropuncture catheter was exchanged for a 7 [place] vascular sheath over a Bentson wire. Bentson wire was advanced into the SVC using a Kumpe catheter. Right innominate vein, right subclavian vein and central cephalic vein were angioplastied using a 7 mm x 40 mm Conquest balloon. Follow-up fistulogram images were obtained. The right innominate vein and right subclavian vein was subsequently treated with a 10 mm x 40 mm balloon and follow-up fistulogram images were obtained. 7 [place] vascular sheath was removed with a pursestring suture. Hemostasis at the puncture site at the end of the procedure. Bandage was placed at the suture site. Ultrasound was used to mark the skin in the mid humeral region where the partially thrombosed aneurysm is located. This area was marked "NS" for "No Stick". Recommend avoiding this area for dialysis cannulation due to the thrombus within this partially thrombosed aneurysm. FINDINGS: Right upper arm brachiocephalic fistula is patent. Arterial anastomosis is widely patent. The cephalic vein is heavily calcified and there is a large aneurysm formation in the mid humeral region of the cephalic vein. This aneurysm is partially thrombosed based on ultrasound. Suspect that this area is being cannulated during dialysis and the reason that clots were identified. Greater than 50% stenosis in the proximal/central cephalic vein. Stenosis in this area resolved following balloon angioplasty with a 7 mm balloon. Irregularity and narrowing involving the right subclavian vein and right innominate vein. Patient has a left jugular dialysis catheter that terminates in the right atrium. Stenosis in right innominate vein and right subclavian appear to be greater than 50%. Following balloon angioplasty with 10 mm balloon, there was markedly improved flow in the right innominate vein. The flow in the right subclavian vein also improved but there are residual densities in the right subclavian vein  which may represent calcifications. Residual collateral flow in the right axilla and right lower neck at the end of the procedure. Overall, the flow in the central veins was improved at the end of the procedure. IMPRESSION: 1. Right upper extremity brachiocephalic fistula is patent. Areas of stenosis involving the proximal cephalic vein, right subclavian vein and right innominate vein were successfully treated with balloon angioplasty. 2. 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 This aneurysm is partially thrombosed. Suspect this area is being cannulated during dialysis which is causing problems. This aneurysm area was marked on the skin and recommend avoiding this area for dialysis cannulation. Recommend surgical consultation to evaluate this aneurysm formation. ACCESS: This access remains amenable to future percutaneous interventions as clinically indicated. Electronically Signed   By: Richarda Overlie M.D.   On: 05/14/2023 17:37   IR US Guide Vasc Access Right  Result Date: 05/14/2023 INDICATION: 42 year old with end-stage renal disease. Unable to cannulate right upper arm fistula due to clotting. Thrombus noted in the proximal to mid upper arm on vascular ultrasound. EXAM: 1. Right upper extremity fistulogram 2. Ultrasound guidance for vascular access 3. Balloon angioplasty of cephalic vein and central veins MEDICATIONS: Versed 0.5 mg ANESTHESIA/SEDATION: The patient's level of  consciousness and vital signs were monitored continuously by radiology nursing throughout the procedure under my direct supervision. FLUOROSCOPY: Radiation Exposure Index (as provided by the fluoroscopic device): 15 mGy Kerma CONTRAST:  100 mL Omnipaque 300 COMPLICATIONS: None immediate. PROCEDURE: Informed written consent was obtained from the patient after a thorough discussion of the procedural risks, benefits and alternatives. All questions were addressed. A timeout was performed prior to the initiation of the procedure.  Right upper arm was prepped and draped in sterile fashion. Maximal barrier sterile technique was utilized including caps, mask, sterile gowns, sterile gloves, sterile drape, hand hygiene and skin antiseptic. Ultrasound demonstrated a patent brachiocephalic fistula. Cephalic vein image was saved for documentation. Skin was anesthetized near the elbow using 1% lidocaine. Using ultrasound guidance, 21 gauge needle was directed into the cephalic vein. Needle was directed into the cephalic vein where there was less calcium along the anterior aspect of the wall. Micropuncture dilator set was placed. Right upper extremity fistulogram images were obtained. Micropuncture catheter was exchanged for a 7 [place] vascular sheath over a Bentson wire. Bentson wire was advanced into the SVC using a Kumpe catheter. Right innominate vein, right subclavian vein and central cephalic vein were angioplastied using a 7 mm x 40 mm Conquest balloon. Follow-up fistulogram images were obtained. The right innominate vein and right subclavian vein was subsequently treated with a 10 mm x 40 mm balloon and follow-up fistulogram images were obtained. 7 [place] vascular sheath was removed with a pursestring suture. Hemostasis at the puncture site at the end of the procedure. Bandage was placed at the suture site. Ultrasound was used to mark the skin in the mid humeral region where the partially thrombosed aneurysm is located. This area was marked "NS" for "No Stick". Recommend avoiding this area for dialysis cannulation due to the thrombus within this partially thrombosed aneurysm. FINDINGS: Right upper arm brachiocephalic fistula is patent. Arterial anastomosis is widely patent. The cephalic vein is heavily calcified and there is a large aneurysm formation in the mid humeral region of the cephalic vein. This aneurysm is partially thrombosed based on ultrasound. Suspect that this area is being cannulated during dialysis and the reason that clots were  identified. Greater than 50% stenosis in the proximal/central cephalic vein. Stenosis in this area resolved following balloon angioplasty with a 7 mm balloon. Irregularity and narrowing involving the right subclavian vein and right innominate vein. Patient has a left jugular dialysis catheter that terminates in the right atrium. Stenosis in right innominate vein and right subclavian appear to be greater than 50%. Following balloon angioplasty with 10 mm balloon, there was markedly improved flow in the right innominate vein. The flow in the right subclavian vein also improved but there are residual densities in the right subclavian vein which may represent calcifications. Residual collateral flow in the right axilla and right lower neck at the end of the procedure. Overall, the flow in the central veins was improved at the end of the procedure. IMPRESSION: 1. Right upper extremity brachiocephalic fistula is patent. Areas of stenosis involving the proximal cephalic vein, right subclavian vein and right innominate vein were successfully treated with balloon angioplasty. 2. 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 This aneurysm is partially thrombosed. Suspect this area is being cannulated during dialysis which is causing problems. This aneurysm area was marked on the skin and recommend avoiding this area for dialysis cannulation. Recommend surgical consultation to evaluate this aneurysm formation. ACCESS: This access remains amenable to future percutaneous interventions  as clinically indicated. Electronically Signed   By: Richarda Overlie M.D.   On: 05/14/2023 17:37    Anti-infectives: Anti-infectives (From admission, onward)    Start     Dose/Rate Route Frequency Ordered Stop   05/08/23 2200  cefTRIAXone (ROCEPHIN) 2 g in sodium chloride 0.9 % 100 mL IVPB        2 g 200 mL/hr over 30 Minutes Intravenous Every 24 hours 05/08/23 1613 05/11/23 2232   05/08/23 2200  metroNIDAZOLE (FLAGYL) IVPB 500 mg         500 mg 100 mL/hr over 60 Minutes Intravenous Every 12 hours 05/08/23 1613 05/11/23 2346   05/04/23 0200  cefTRIAXone (ROCEPHIN) 2 g in sodium chloride 0.9 % 100 mL IVPB        2 g 200 mL/hr over 30 Minutes Intravenous Every 24 hours 05/04/23 0102 05/08/23 0144   05/04/23 0200  metroNIDAZOLE (FLAGYL) IVPB 500 mg        500 mg 100 mL/hr over 60 Minutes Intravenous Every 12 hours 05/04/23 0102 05/09/23 1203   04/30/23 1800  ciprofloxacin (CIPRO) IVPB 400 mg  Status:  Discontinued        400 mg 200 mL/hr over 60 Minutes Intravenous Every 24 hours 04/30/23 1443 05/02/23 1140   04/30/23 1515  metroNIDAZOLE (FLAGYL) IVPB 500 mg  Status:  Discontinued        500 mg 100 mL/hr over 60 Minutes Intravenous Every 12 hours 04/30/23 1418 05/02/23 1140        Assessment/Plan 42 yo female presenting with abdominal pain, diarrhea, low grade fevers, dilated small bowel and colonic wall thickening on CT   POD#12 s/p diagnostic lap converted to ex-lap with extended ileo-cecectomy (mid-ileum to just past cecum), 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 Hillery Hunter - Intra-op findings were notable for adhesions in the RLQ w/ associated twisting of the mesentery resulting in SB ischemia. - CT 9/26 ordered due to leukocytosis but overall looks ok, some residual small bowel thickening but no large abscess. Off IV abx as of 9/28. - Dakin's wet to dry dressings BID x72 hrs (started 9/30, will end 10/3) - WOC RN following for new ileostomy.  - Has had high volume ileostomy output, 800cc last 24hr. Continue BID iron, BID fiber, and TID imodium  - mobilize, continue therapies - rec SNF - Tolerating diet but not taking in much at all. Discussed with primary team, will start marinol and ask dietician to see   FEN: reg, Glucerna ID: Rocephin/Flagyl 9/21 > 9/28 VTE: SCDs, SQH q 8h Foley: out    LOS: 16 days    Franne Forts, [facility] 05/16/2023, 8:52 AM Please see Amion for pager number during day hours  7:00am-4:30pm

## 2023-05-16 NOTE — Plan of Care (Signed)
Problem: Coping: Goal: Ability to adjust to condition or change in health will improve 05/16/2023 0512 by Tobey Bride, LPN Outcome: Progressing 05/16/2023 0512 by Tobey Bride, LPN Outcome: Progressing   Problem: Fluid Volume: Goal: Ability to maintain a balanced intake and output will improve 05/16/2023 0512 by Tobey Bride, LPN Outcome: Progressing 05/16/2023 0512 by Tobey Bride, LPN Outcome: Progressing   Problem: Health Behavior/Discharge Planning: Goal: Ability to identify and utilize available resources and services will improve 05/16/2023 0512 by Tobey Bride, LPN Outcome: Progressing 05/16/2023 0512 by Tobey Bride, LPN Outcome: Progressing Goal: Ability to manage health-related needs will improve 05/16/2023 0512 by Tobey Bride, LPN Outcome: Progressing 05/16/2023 0512 by Tobey Bride, LPN Outcome: Progressing   Problem: Metabolic: Goal: Ability to maintain appropriate glucose levels will improve 05/16/2023 0512 by Tobey Bride, LPN Outcome: Progressing 05/16/2023 0512 by Tobey Bride, LPN Outcome: Progressing   Problem: Nutritional: Goal: Maintenance of adequate nutrition will improve 05/16/2023 0512 by Tobey Bride, LPN Outcome: Progressing 05/16/2023 0512 by Tobey Bride, LPN Outcome: Progressing Goal: Progress toward achieving an optimal weight will improve 05/16/2023 0512 by Tobey Bride, LPN Outcome: Progressing 05/16/2023 0512 by Tobey Bride, LPN Outcome: Progressing   Problem: Skin Integrity: Goal: Risk for impaired skin integrity will decrease 05/16/2023 0512 by Tobey Bride, LPN Outcome: Progressing 05/16/2023 0512 by Tobey Bride, LPN Outcome: Progressing   Problem: Tissue Perfusion: Goal: Adequacy of tissue perfusion will improve 05/16/2023 0512 by Tobey Bride, LPN Outcome: Progressing 05/16/2023 0512 by Tobey Bride, LPN Outcome: Progressing   Problem:  Education: Goal: Knowledge of General Education information will improve Description: Including pain rating scale, medication(s)/side effects and non-pharmacologic comfort measures 05/16/2023 0512 by Tobey Bride, LPN Outcome: Progressing 05/16/2023 0512 by Tobey Bride, LPN Outcome: Progressing   Problem: Health Behavior/Discharge Planning: Goal: Ability to manage health-related needs will improve 05/16/2023 0512 by Tobey Bride, LPN Outcome: Progressing 05/16/2023 0512 by Tobey Bride, LPN Outcome: Progressing   Problem: Clinical Measurements: Goal: Ability to maintain clinical measurements within normal limits will improve 05/16/2023 0512 by Tobey Bride, LPN Outcome: Progressing 05/16/2023 0512 by Tobey Bride, LPN Outcome: Progressing Goal: Will remain free from infection 05/16/2023 0512 by Tobey Bride, LPN Outcome: Progressing 05/16/2023 0512 by Tobey Bride, LPN Outcome: Progressing Goal: Diagnostic test results will improve 05/16/2023 0512 by Tobey Bride, LPN Outcome: Progressing 05/16/2023 0512 by Tobey Bride, LPN Outcome: Progressing Goal: Respiratory complications will improve 05/16/2023 0512 by Tobey Bride, LPN Outcome: Progressing 05/16/2023 0512 by Tobey Bride, LPN Outcome: Progressing Goal: Cardiovascular complication will be avoided 05/16/2023 0512 by Tobey Bride, LPN Outcome: Progressing 05/16/2023 0512 by Tobey Bride, LPN Outcome: Progressing   Problem: Activity: Goal: Risk for activity intolerance will decrease 05/16/2023 0512 by Tobey Bride, LPN Outcome: Progressing 05/16/2023 0512 by Tobey Bride, LPN Outcome: Progressing   Problem: Nutrition: Goal: Adequate nutrition will be maintained 05/16/2023 0512 by Tobey Bride, LPN Outcome: Progressing 05/16/2023 0512 by Tobey Bride, LPN Outcome: Progressing   Problem: Coping: Goal: Level of anxiety will  decrease 05/16/2023 0512 by Tobey Bride, LPN Outcome: Progressing 05/16/2023 0512 by Tobey Bride, LPN Outcome: Progressing   Problem: Elimination: Goal: Will not experience complications related to bowel motility 05/16/2023 0512 by Tobey Bride, LPN Outcome: Progressing 05/16/2023 0512 by Tobey Bride, LPN Outcome: Progressing Goal: Will not experience  complications related to urinary retention 05/16/2023 0512 by Tobey Bride, LPN Outcome: Progressing 05/16/2023 0512 by Tobey Bride, LPN Outcome: Progressing   Problem: Pain Managment: Goal: General experience of comfort will improve 05/16/2023 0512 by Tobey Bride, LPN Outcome: Progressing 05/16/2023 0512 by Tobey Bride, LPN Outcome: Progressing   Problem: Safety: Goal: Ability to remain free from injury will improve 05/16/2023 0512 by Tobey Bride, LPN Outcome: Progressing 05/16/2023 0512 by Tobey Bride, LPN Outcome: Progressing   Problem: Skin Integrity: Goal: Risk for impaired skin integrity will decrease 05/16/2023 0512 by Tobey Bride, LPN Outcome: Progressing 05/16/2023 0512 by Tobey Bride, LPN Outcome: Progressing   Problem: Education: Goal: Knowledge of disease and its progression will improve 05/16/2023 0512 by Tobey Bride, LPN Outcome: Progressing 05/16/2023 0512 by Tobey Bride, LPN Outcome: Progressing   Problem: Fluid Volume: Goal: Compliance with measures to maintain balanced fluid volume will improve 05/16/2023 0512 by Tobey Bride, LPN Outcome: Progressing 05/16/2023 0512 by Tobey Bride, LPN Outcome: Progressing   Problem: Health Behavior/Discharge Planning: Goal: Ability to manage health-related needs will improve 05/16/2023 0512 by Tobey Bride, LPN Outcome: Progressing 05/16/2023 0512 by Tobey Bride, LPN Outcome: Progressing   Problem: Nutritional: Goal: Ability to make healthy dietary choices will  improve 05/16/2023 0512 by Tobey Bride, LPN Outcome: Progressing 05/16/2023 0512 by Tobey Bride, LPN Outcome: Progressing   Problem: Clinical Measurements: Goal: Complications related to the disease process, condition or treatment will be avoided or minimized 05/16/2023 0512 by Tobey Bride, LPN Outcome: Progressing 05/16/2023 0512 by Tobey Bride, LPN Outcome: Progressing

## 2023-05-16 NOTE — Plan of Care (Signed)
  Problem: Coping: Goal: Ability to adjust to condition or change in health will improve Outcome: Progressing   Problem: Fluid Volume: Goal: Ability to maintain a balanced intake and output will improve Outcome: Progressing   Problem: Health Behavior/Discharge Planning: Goal: Ability to identify and utilize available resources and services will improve Outcome: Progressing Goal: Ability to manage health-related needs will improve Outcome: Progressing   Problem: Metabolic: Goal: Ability to maintain appropriate glucose levels will improve Outcome: Progressing   Problem: Nutritional: Goal: Maintenance of adequate nutrition will improve Outcome: Progressing Goal: Progress toward achieving an optimal weight will improve Outcome: Progressing   Problem: Skin Integrity: Goal: Risk for impaired skin integrity will decrease Outcome: Progressing   Problem: Tissue Perfusion: Goal: Adequacy of tissue perfusion will improve Outcome: Progressing   Problem: Education: Goal: Knowledge of General Education information will improve Description: Including pain rating scale, medication(s)/side effects and non-pharmacologic comfort measures Outcome: Progressing   Problem: Health Behavior/Discharge Planning: Goal: Ability to manage health-related needs will improve Outcome: Progressing   Problem: Clinical Measurements: Goal: Ability to maintain clinical measurements within normal limits will improve Outcome: Progressing Goal: Will remain free from infection Outcome: Progressing Goal: Diagnostic test results will improve Outcome: Progressing Goal: Respiratory complications will improve Outcome: Progressing Goal: Cardiovascular complication will be avoided Outcome: Progressing   Problem: Activity: Goal: Risk for activity intolerance will decrease Outcome: Progressing   Problem: Nutrition: Goal: Adequate nutrition will be maintained Outcome: Progressing   Problem: Coping: Goal:  Level of anxiety will decrease Outcome: Progressing   Problem: Elimination: Goal: Will not experience complications related to bowel motility Outcome: Progressing Goal: Will not experience complications related to urinary retention Outcome: Progressing   Problem: Pain Managment: Goal: General experience of comfort will improve Outcome: Progressing   Problem: Safety: Goal: Ability to remain free from injury will improve Outcome: Progressing   Problem: Skin Integrity: Goal: Risk for impaired skin integrity will decrease Outcome: Progressing   Problem: Education: Goal: Knowledge of disease and its progression will improve Outcome: Progressing   Problem: Fluid Volume: Goal: Compliance with measures to maintain balanced fluid volume will improve Outcome: Progressing   Problem: Health Behavior/Discharge Planning: Goal: Ability to manage health-related needs will improve Outcome: Progressing   Problem: Nutritional: Goal: Ability to make healthy dietary choices will improve Outcome: Progressing   Problem: Clinical Measurements: Goal: Complications related to the disease process, condition or treatment will be avoided or minimized Outcome: Progressing

## 2023-05-16 NOTE — Progress Notes (Signed)
PROGRESS NOTE [name]  NWG:[id number] DOB: Jan 03, 1981 DOA: 04/30/2023 PCP: [facility]  Brief Narrative/Hospital Course: 42 y.o. female with past medical history significant for type 1 diabetes mellitus with diabetic retinopathy, PVD s/p bilateral BKA, ESRD on HD MWF, HTN, HLD, history of CVA, history of chronic anemia, GBS, mesenteric ischemia with colonic pneumatosis May 2024 treated nonoperatively who presented to [facility] ED on 9/17 from [facility] with acute onset abdominal pain.  Onset day prior to admission associated with diarrhea. In the ED, patient was febrile, tachycardic with lactic acid 1.9 followed by 2.5. Chest x-ray>findings of atelectasis versus infiltrate. CT abdomen/pelvis with contrast>>shows evidence of a small bowel obstruction with dilated ileal segment up to 4 cm, sigmoid segment consistent with colitis, diffuse gastric wall thickening, cholelithiasis with mild chronic prominence of the common bile duct.  General surgery was consulted and admitted for further management of small bowel obstruction  partial Nephrology has been consulted for dialysis aVF clotted off S/P TDC.  IR fistulogram and balloon angioplasty 05/14/23.    Subjective: Patient seen and examined Alert awake eyes closed, answers questions, reports she feels hungry no complaint Overnight patient has been afebrile but episodes of hypoglycemia up to 54-66 Labs pending this morning Has not been eating well, not much appetite added on Marinol as appetite stimulant   Assessment and Plan: Principal Problem:   SBO (small bowel obstruction) (HCC) Active Problems:   Ischemic disease of gut (HCC)   Partial SBO Abdominal pain Sepsis Acute pancreatitis High output ostomy: Patient presenting with abdominal pain found to have elevated lactic acidosis, febrile with CT abdomen/pelvis consistent with small bowel obstruction and colitis>symptoms continued to progress  despite IV antibiotics and conservative measures. 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 difficile antigen positive, toxin negative. GI PCR panel negative. CT 9/26>obtained due to leukocytosis, looked okay some residual small bowel thickening but no large abscess, antibiotics discontinued 9/28 Tolerating p.o. having ileostomy output.  But not eating much, adding Marinol. Wound VAC removed 9/28, on Dakins wet-to-dry bid till 10/3 per CCS and WOCN.  Ileostomy output charted 800 cc -continue on Imodium 2 mg 3 times daily, FiberCon twice daily Monitor temperature curve diet tolerance ostomy output WBC count. Recent Labs  Lab 05/10/23 1932 05/11/23 0232 05/12/23 0354 05/13/23 0807 05/14/23 1057 05/14/23 2227 05/15/23 0844  WBC  --    < > 21.8* 20.8* 14.3* 15.5* 16.5*  LATICACIDVEN 1.5  --   --   --   --   --   --   PROCALCITON  --   --  4.89  --   --   --   --    < > = values in this interval not displayed.     ESRD HD MWF Hypertension/volume: nephrology following.aVF clotted off S/P TDC.IR fistulogram and balloon angioplasty of cephalic vein and central vein down 05/14/23.  Last HD 10/2.  Metabolic bone disease of CKD: Continue sevelamer for hyperphosphatemia continue IV VRDRA and Sensipar when eating per nephrology  Anemia of ESRD: Continue ESA monitor hemoglobin and appears stable holding above 7 g Recent Labs  Lab 05/12/23 0354 05/13/23 0807 05/14/23 1057 05/14/23 2227 05/15/23 0844  HGB 7.3* 7.6* 7.9* 7.6* 7.3*  HCT 24.1* 25.0* 25.3* 24.6* 23.4*    Hypotension-Intradialytic. Essential hypertension PTA on isosorbide mononitrate 30 mg p.o. daily. Hold due to borderline BP in 90s still.  Type 1  diabetes mellitus: Hypoglcyemic so will stop semglee. PTA on Semglee 12 units [place] BID. Cont ssi.  Added INR encouraged oral intake Recent Labs  Lab 05/15/23 2035 05/15/23 2305 05/16/23 0055  05/16/23 0407 05/16/23 0842  GLUCAP 111* 66* 94 93 91   PVD s/p b/l BKA HLD Holding atorvastatin for now    Goals of care On 05/08/2023, patient discussed with multiple medical providers and staff that she did not want to continue with aggressive care to include hemodialysis in which she signed off early yesterday.  She wanted to focus more on comfort.  But as of 05/09/2023 she reports ""I want to live" and I will continue hemodialysis at this time.  Seen by palliative care with recommendations of full code, full scope of care at this time.  Pressure injury POA stage II sacrum-see below Pressure Injury 04/30/23 Sacrum Mid Stage 2 -  Partial thickness loss of dermis presenting as a shallow open injury with a red, pink wound bed without slough. 5 cm x 7 cm pink dry (Active)  04/30/23   Location: Sacrum  Location Orientation: Mid  Staging: Stage 2 -  Partial thickness loss of dermis presenting as a shallow open injury with a red, pink wound bed without slough.  Wound Description (Comments): 5 cm x 7 cm pink dry  Present on Admission: Yes  Dressing Type Foam - Lift dressing to assess site every shift 05/12/23 0800    DVT prophylaxis: heparin injection 5,000 Units Start: 04/30/23 0900 Code Status:   Code Status: Full Code Family Communication: plan of care discussed with patient at bedside. Patient status is: Inpatient because of pSBO Level of care: Med-Surg   Dispo: The patient is from: snf            Anticipated disposition: SNF once stable and cleared by surgery  Objective: Vitals last 24 hrs: Vitals:   05/15/23 1303 05/15/23 2033 05/16/23 0406 05/16/23 0841  BP: 91/64 97/69 102/63 96/60  Pulse: (!) 101 (!) 105 95 90  Resp: 20   17  Temp: 98.1 F (36.7 C) 98.6 F (37 C) 99.3 F (37.4 C) 99.4 F (37.4 C)  TempSrc: Oral Oral Oral Oral  SpO2: 94% (!) 68%    Weight:       Weight change:   Physical Examination: General exam: alert awake, thin frail,OLder than stated  age HEENT:Oral mucosa moist, Ear/Nose WNL grossly Respiratory system: Bilaterally clear BS,no use of accessory muscle Cardiovascular system: S1 & S2 +, No JVD. Gastrointestinal system: Abdomen soft, ileostomy present with stool in the pouch and bilious output, dressing in the midline-patient refused further removal or examination-despite multiple request. Nervous System: Alert, awake, moving all extremities,and following commands. Extremities: b/l BKA Skin: No rashes,no icterus. MSK: Normal muscle bulk,tone, power   Medications reviewed:  Scheduled Meds:  Chlorhexidine Gluconate Cloth  6 each Topical Q0600   [START ON 05/17/2023] darbepoetin (ARANESP) injection - DIALYSIS  60 mcg Subcutaneous Q Fri-1800   doxercalciferol  4 mcg Intravenous Q M,W,F-HD   dronabinol  5 mg Oral BID AC   feeding supplement (GLUCERNA SHAKE)  237 mL Oral TID BM   ferrous sulfate  325 mg Oral BID WC   heparin  5,000 Units Subcutaneous Q8H   insulin aspart  0-6 Units Subcutaneous Q6H   loperamide  2 mg Oral TID   pantoprazole  40 mg Oral QHS   polycarbophil  1,250 mg Oral BID   sevelamer carbonate  0.8 g Oral TID WC   Continuous  Infusions:   Diet Order             Diet regular Room service appropriate? Yes; Fluid consistency: Thin  Diet effective now                   Intake/Output Summary (Last 24 hours) at 05/16/2023 1033 Last data filed at 05/16/2023 0900 Gross per 24 hour  Intake 418 ml  Output 900 ml  Net -482 ml   Net IO Since Admission: -2,233.65 mL [05/16/23 1033]  Wt Readings from Last 3 Encounters:  03/17/23 59 kg  12/28/22 53.6 kg  07/27/22 84.9 kg     Unresulted Labs (From admission, onward)     Start     Ordered   05/16/23 0500  Basic metabolic panel  Daily,   R     Question:  Specimen collection method  Answer:  Lab=Lab collect   05/15/23 1302   05/16/23 0500  CBC  Daily,   R     Question:  Specimen collection method  Answer:  Lab=Lab collect   05/15/23 1302           Data Reviewed: I have personally reviewed following labs and imaging studies CBC: Recent Labs  Lab 05/12/23 0354 05/13/23 0807 05/14/23 1057 05/14/23 2227 05/15/23 0844  WBC 21.8* 20.8* 14.3* 15.5* 16.5*  HGB 7.3* 7.6* 7.9* 7.6* 7.3*  HCT 24.1* 25.0* 25.3* 24.6* 23.4*  MCV 83.4 88.0 85.2 85.7 85.7  PLT 420* 385 365 372 367   Basic Metabolic Panel: Recent Labs  Lab 05/10/23 1950 05/11/23 0232 05/13/23 0807 05/14/23 2228 05/15/23 0844  NA 135 134* 137 134* 133*  K 4.5 3.5 4.1 4.4 4.4  CL 97* 95* 100 93* 92*  CO2 22 28 23 29 26   GLUCOSE 107* 80 173* 109* 76  BUN 41* 14 28* 17 20  CREATININE 6.74* 3.11* 6.02* 4.37* 4.76*  CALCIUM 8.5* 8.3* 8.4* 9.0 8.8*  PHOS 7.0* 3.2 5.7* 4.9* 5.1*   GFR: Estimated Creatinine Clearance: 4.7 mL/min (A) (by C-G formula based on SCr of 4.76 mg/dL (H)). Liver Function Tests: Recent Labs  Lab 05/10/23 1950 05/11/23 0232 05/13/23 0807 05/14/23 2228 05/15/23 0844  ALBUMIN 1.9* 1.9* 1.7* 2.0* 1.9*   Recent Labs  Lab 05/14/23 1057  INR 1.9*  CBG: Recent Labs  Lab 05/15/23 2035 05/15/23 2305 05/16/23 0055 05/16/23 0407 05/16/23 0842  GLUCAP 111* 66* 94 93 91   Recent Labs  Lab 05/10/23 1932 05/12/23 0354  PROCALCITON  --  4.89  LATICACIDVEN 1.5  --     No results found for this or any previous visit (from the past 240 hour(s)).  Antimicrobials: Anti-infectives (From admission, onward)    Start     Dose/Rate Route Frequency Ordered Stop   05/08/23 2200  cefTRIAXone (ROCEPHIN) 2 g in sodium chloride 0.9 % 100 mL IVPB        2 g 200 mL/hr over 30 Minutes Intravenous Every 24 hours 05/08/23 1613 05/11/23 2232   05/08/23 2200  metroNIDAZOLE (FLAGYL) IVPB 500 mg        500 mg 100 mL/hr over 60 Minutes Intravenous Every 12 hours 05/08/23 1613 05/11/23 2346   05/04/23 0200  cefTRIAXone (ROCEPHIN) 2 g in sodium chloride 0.9 % 100 mL IVPB        2 g 200 mL/hr over 30 Minutes Intravenous Every 24 hours 05/04/23 0102 05/08/23  0144   05/04/23 0200  metroNIDAZOLE (FLAGYL) IVPB 500 mg  500 mg 100 mL/hr over 60 Minutes Intravenous Every 12 hours 05/04/23 0102 05/09/23 1203   04/30/23 1800  ciprofloxacin (CIPRO) IVPB 400 mg  Status:  Discontinued        400 mg 200 mL/hr over 60 Minutes Intravenous Every 24 hours 04/30/23 1443 05/02/23 1140   04/30/23 1515  metroNIDAZOLE (FLAGYL) IVPB 500 mg  Status:  Discontinued        500 mg 100 mL/hr over 60 Minutes Intravenous Every 12 hours 04/30/23 1418 05/02/23 1140      Culture/Microbiology    Component Value Date/Time   SDES BLOOD LEFT HAND 05/03/2023 1305   SPECREQUEST  05/03/2023 1305    AEROBIC BOTTLE ONLY Blood Culture results may not be optimal due to an inadequate volume of blood received in culture bottles   CULT  05/03/2023 1305    NO GROWTH 5 DAYS Performed at [facility], [address], [place], [place] [address]    REPTSTATUS 05/08/2023 FINAL 05/03/2023 1305  Radiology Studies: IR AV DIALY SHUNT INTRO NEEDLE/INTRACATH INITIAL W/PTA/IMG RIGHT  Result Date: 05/14/2023 INDICATION: 42 year old with end-stage renal disease. Unable to cannulate right upper arm fistula due to clotting. Thrombus noted in the proximal to mid upper arm on vascular ultrasound. EXAM: 1. Right upper extremity fistulogram 2. Ultrasound guidance for vascular access 3. Balloon angioplasty of cephalic vein and central veins MEDICATIONS: Versed 0.5 mg ANESTHESIA/SEDATION: The patient's level of consciousness and vital signs were monitored continuously by radiology nursing throughout the procedure under my direct supervision. FLUOROSCOPY: Radiation Exposure Index (as provided by the fluoroscopic device): 15 mGy Kerma CONTRAST:  100 mL Omnipaque 300 COMPLICATIONS: None immediate. PROCEDURE: Informed written consent was obtained from the patient after a thorough discussion of the procedural risks, benefits and alternatives. All questions were addressed. A timeout was performed prior to  the initiation of the procedure. Right upper arm was prepped and draped in sterile fashion. Maximal barrier sterile technique was utilized including caps, mask, sterile gowns, sterile gloves, sterile drape, hand hygiene and skin antiseptic. Ultrasound demonstrated a patent brachiocephalic fistula. Cephalic vein image was saved for documentation. Skin was anesthetized near the elbow using 1% lidocaine. Using ultrasound guidance, 21 gauge needle was directed into the cephalic vein. Needle was directed into the cephalic vein where there was less calcium along the anterior aspect of the wall. Micropuncture dilator set was placed. Right upper extremity fistulogram images were obtained. Micropuncture catheter was exchanged for a 7 [place] vascular sheath over a Bentson wire. Bentson wire was advanced into the SVC using a Kumpe catheter. Right innominate vein, right subclavian vein and central cephalic vein were angioplastied using a 7 mm x 40 mm Conquest balloon. Follow-up fistulogram images were obtained. The right innominate vein and right subclavian vein was subsequently treated with a 10 mm x 40 mm balloon and follow-up fistulogram images were obtained. 7 [place] vascular sheath was removed with a pursestring suture. Hemostasis at the puncture site at the end of the procedure. Bandage was placed at the suture site. Ultrasound was used to mark the skin in the mid humeral region where the partially thrombosed aneurysm is located. This area was marked "NS" for "No Stick". Recommend avoiding this area for dialysis cannulation due to the thrombus within this partially thrombosed aneurysm. FINDINGS: Right upper arm brachiocephalic fistula is patent. Arterial anastomosis is widely patent. The cephalic vein is heavily calcified and there is a large aneurysm formation in the mid humeral region of the cephalic vein. This aneurysm is  partially thrombosed based on ultrasound. Suspect that this area is being cannulated during  dialysis and the reason that clots were identified. Greater than 50% stenosis in the proximal/central cephalic vein. Stenosis in this area resolved following balloon angioplasty with a 7 mm balloon. Irregularity and narrowing involving the right subclavian vein and right innominate vein. Patient has a left jugular dialysis catheter that terminates in the right atrium. Stenosis in right innominate vein and right subclavian appear to be greater than 50%. Following balloon angioplasty with 10 mm balloon, there was markedly improved flow in the right innominate vein. The flow in the right subclavian vein also improved but there are residual densities in the right subclavian vein which may represent calcifications. Residual collateral flow in the right axilla and right lower neck at the end of the procedure. Overall, the flow in the central veins was improved at the end of the procedure. IMPRESSION: 1. Right upper extremity brachiocephalic fistula is patent. Areas of stenosis involving the proximal cephalic vein, right subclavian vein and right innominate vein were successfully treated with balloon angioplasty. 2. 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 This aneurysm is partially thrombosed. Suspect this area is being cannulated during dialysis which is causing problems. This aneurysm area was marked on the skin and recommend avoiding this area for dialysis cannulation. Recommend surgical consultation to evaluate this aneurysm formation. ACCESS: This access remains amenable to future percutaneous interventions as clinically indicated. Electronically Signed   By: Richarda Overlie M.D.   On: 05/14/2023 17:37   IR US Guide Vasc Access Right  Result Date: 05/14/2023 INDICATION: 42 year old with end-stage renal disease. Unable to cannulate right upper arm fistula due to clotting. Thrombus noted in the proximal to mid upper arm on vascular ultrasound. EXAM: 1. Right upper extremity fistulogram 2. Ultrasound  guidance for vascular access 3. Balloon angioplasty of cephalic vein and central veins MEDICATIONS: Versed 0.5 mg ANESTHESIA/SEDATION: The patient's level of consciousness and vital signs were monitored continuously by radiology nursing throughout the procedure under my direct supervision. FLUOROSCOPY: Radiation Exposure Index (as provided by the fluoroscopic device): 15 mGy Kerma CONTRAST:  100 mL Omnipaque 300 COMPLICATIONS: None immediate. PROCEDURE: Informed written consent was obtained from the patient after a thorough discussion of the procedural risks, benefits and alternatives. All questions were addressed. A timeout was performed prior to the initiation of the procedure. Right upper arm was prepped and draped in sterile fashion. Maximal barrier sterile technique was utilized including caps, mask, sterile gowns, sterile gloves, sterile drape, hand hygiene and skin antiseptic. Ultrasound demonstrated a patent brachiocephalic fistula. Cephalic vein image was saved for documentation. Skin was anesthetized near the elbow using 1% lidocaine. Using ultrasound guidance, 21 gauge needle was directed into the cephalic vein. Needle was directed into the cephalic vein where there was less calcium along the anterior aspect of the wall. Micropuncture dilator set was placed. Right upper extremity fistulogram images were obtained. Micropuncture catheter was exchanged for a 7 [place] vascular sheath over a Bentson wire. Bentson wire was advanced into the SVC using a Kumpe catheter. Right innominate vein, right subclavian vein and central cephalic vein were angioplastied using a 7 mm x 40 mm Conquest balloon. Follow-up fistulogram images were obtained. The right innominate vein and right subclavian vein was subsequently treated with a 10 mm x 40 mm balloon and follow-up fistulogram images were obtained. 7 [place] vascular sheath was removed with a pursestring suture. Hemostasis at the puncture site at the end of the procedure.  Bandage was placed at the suture site. Ultrasound was used to mark the skin in the mid humeral region where the partially thrombosed aneurysm is located. This area was marked "NS" for "No Stick". Recommend avoiding this area for dialysis cannulation due to the thrombus within this partially thrombosed aneurysm. FINDINGS: Right upper arm brachiocephalic fistula is patent. Arterial anastomosis is widely patent. The cephalic vein is heavily calcified and there is a large aneurysm formation in the mid humeral region of the cephalic vein. This aneurysm is partially thrombosed based on ultrasound. Suspect that this area is being cannulated during dialysis and the reason that clots were identified. Greater than 50% stenosis in the proximal/central cephalic vein. Stenosis in this area resolved following balloon angioplasty with a 7 mm balloon. Irregularity and narrowing involving the right subclavian vein and right innominate vein. Patient has a left jugular dialysis catheter that terminates in the right atrium. Stenosis in right innominate vein and right subclavian appear to be greater than 50%. Following balloon angioplasty with 10 mm balloon, there was markedly improved flow in the right innominate vein. The flow in the right subclavian vein also improved but there are residual densities in the right subclavian vein which may represent calcifications. Residual collateral flow in the right axilla and right lower neck at the end of the procedure. Overall, the flow in the central veins was improved at the end of the procedure. IMPRESSION: 1. Right upper extremity brachiocephalic fistula is patent. Areas of stenosis involving the proximal cephalic vein, right subclavian vein and right innominate vein were successfully treated with balloon angioplasty. 2. 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 This aneurysm is partially thrombosed. Suspect this area is being cannulated during dialysis which is causing  problems. This aneurysm area was marked on the skin and recommend avoiding this area for dialysis cannulation. Recommend surgical consultation to evaluate this aneurysm formation. ACCESS: This access remains amenable to future percutaneous interventions as clinically indicated. Electronically Signed   By: Richarda Overlie M.D.   On: 05/14/2023 17:37     LOS: 16 days   Lanae Boast, MD [facility]  05/16/2023, 10:33 AM

## 2023-05-17 DIAGNOSIS — E440 Moderate protein-calorie malnutrition: Secondary | ICD-10-CM | POA: Insufficient documentation

## 2023-05-17 DIAGNOSIS — K56609 Unspecified intestinal obstruction, unspecified as to partial versus complete obstruction: Secondary | ICD-10-CM | POA: Diagnosis not present

## 2023-05-17 LAB — BASIC METABOLIC PANEL
Anion gap: 15 (ref 5–15)
BUN: 19 mg/dL (ref 6–20)
CO2: 29 mmol/L (ref 22–32)
Calcium: 9.2 mg/dL (ref 8.9–10.3)
Chloride: 91 mmol/L — ABNORMAL LOW (ref 98–111)
Creatinine, Ser: 4.91 mg/dL — ABNORMAL HIGH (ref 0.44–1.00)
GFR, Estimated: 11 mL/min — ABNORMAL LOW (ref >60)
Glucose, Bld: 90 mg/dL (ref 70–99)
Potassium: 4.6 mmol/L (ref 3.5–5.1)
Sodium: 135 mmol/L (ref 135–145)

## 2023-05-17 LAB — GLUCOSE, CAPILLARY
Glucose-Capillary: 68 mg/dL — ABNORMAL LOW (ref 70–99)
Glucose-Capillary: 93 mg/dL (ref 70–99)
Glucose-Capillary: 80 mg/dL (ref 70–99)
Glucose-Capillary: 106 mg/dL — ABNORMAL HIGH (ref 70–99)
Glucose-Capillary: 108 mg/dL — ABNORMAL HIGH (ref 70–99)

## 2023-05-17 LAB — CBC
HCT: 26.8 % — ABNORMAL LOW (ref 36.0–46.0)
Hemoglobin: 7.9 g/dL — ABNORMAL LOW (ref 12.0–15.0)
MCH: 25.7 pg — ABNORMAL LOW (ref 26.0–34.0)
MCHC: 29.5 g/dL — ABNORMAL LOW (ref 30.0–36.0)
MCV: 87.3 fL (ref 80.0–100.0)
Platelets: 338 10*3/uL (ref 150–400)
RBC: 3.07 MIL/uL — ABNORMAL LOW (ref 3.87–5.11)
RDW: 20 % — ABNORMAL HIGH (ref 11.5–15.5)
WBC: 14.2 10*3/uL — ABNORMAL HIGH (ref 4.0–10.5)
nRBC: 0 % (ref 0.0–0.2)

## 2023-05-17 MED ORDER — ANTICOAGULANT SODIUM CITRATE 4% (200MG/5ML) IV SOLN: 5.0000 mL | Status: DC | PRN

## 2023-05-17 MED ORDER — NEPRO/CARBSTEADY PO LIQD: 237.0000 mL | ORAL | Status: DC | PRN

## 2023-05-17 MED ORDER — INSULIN ASPART 100 UNIT/ML IJ SOLN
0.0000 [IU] | Freq: Three times a day (TID) | INTRAMUSCULAR | Status: DC
  Administered 2023-05-19 (×2): 2 [IU] via SUBCUTANEOUS
  Administered 2023-05-24 (×2): 1 [IU] via SUBCUTANEOUS

## 2023-05-17 MED ORDER — MIDODRINE HCL 5 MG PO TABS
5.0000 mg | ORAL_TABLET | Freq: Three times a day (TID) | ORAL | Status: DC
  Administered 2023-05-17 – 2023-05-18 (×2): 5 mg via ORAL
  Filled 2023-05-17 (×2): qty 1

## 2023-05-17 MED ORDER — ALTEPLASE 2 MG IJ SOLR: 2.0000 mg | Freq: Once | INTRAMUSCULAR | Status: DC | PRN

## 2023-05-17 MED ORDER — LIDOCAINE HCL (PF) 1 % IJ SOLN: 5.0000 mL | INTRAMUSCULAR | Status: DC | PRN

## 2023-05-17 MED ORDER — LIDOCAINE-PRILOCAINE 2.5-2.5 % EX CREA: 1.0000 | TOPICAL_CREAM | CUTANEOUS | Status: DC | PRN

## 2023-05-17 MED ORDER — HEPARIN SODIUM (PORCINE) 1000 UNIT/ML DIALYSIS
1000.0000 [IU] | INTRAMUSCULAR | Status: DC | PRN
  Filled 2023-05-17 (×2): qty 1

## 2023-05-17 MED ORDER — PENTAFLUOROPROP-TETRAFLUOROETH EX AERO: 1.0000 | INHALATION_SPRAY | CUTANEOUS | Status: DC | PRN

## 2023-05-17 NOTE — Progress Notes (Signed)
[facility] Progress Note   Subjective:   Patient seen in room, expresses frustration that we keep waking her up. Reports abdominal pain is improving. Denies SOB and CP.   Objective Vitals:   05/16/23 2218 05/16/23 2348 05/17/23 0411 05/17/23 0442  BP: (!) 98/58 98/61 (!) 94/58   Pulse: 86 86 89   Resp: 18 18 18    Temp: 98.4 F (36.9 C) 98.2 F (36.8 C) 98 F (36.7 C)   TempSrc: Oral Oral Oral   SpO2: 100% 100% 100%   Weight:    29 kg   Physical Exam General: Chronically ill appearing female in NAD Heart: RRR, no murmurs, rubs or gallops Lungs: CTA bilaterally Abdomen: Soft, non-distended, +BS Extremities: B/l BKA, no edema appreciated Dialysis Access: RUE AVF   Additional Objective Labs: Basic Metabolic Panel: Recent Labs  Lab 05/13/23 0807 05/14/23 2228 05/15/23 0844 05/16/23 2011 05/17/23 0556  NA 137 134* 133* 135 135  K 4.1 4.4 4.4 4.7 4.6  CL 100 93* 92* 94* 91*  CO2 23 29 26 28 29   GLUCOSE 173* 109* 76 120* 90  BUN 28* 17 20 15 19   CREATININE 6.02* 4.37* 4.76* 4.34* 4.91*  CALCIUM 8.4* 9.0 8.8* 9.2 9.2  PHOS 5.7* 4.9* 5.1*  --   --    Liver Function Tests: Recent Labs  Lab 05/13/23 0807 05/14/23 2228 05/15/23 0844  ALBUMIN 1.7* 2.0* 1.9*   No results for input(s): "LIPASE", "AMYLASE" in the last 168 hours. CBC: Recent Labs  Lab 05/14/23 1057 05/14/23 2227 05/15/23 0844 05/16/23 2011 05/17/23 0556  WBC 14.3* 15.5* 16.5* 15.8* 14.2*  HGB 7.9* 7.6* 7.3* 7.3* 7.9*  HCT 25.3* 24.6* 23.4* 24.1* 26.8*  MCV 85.2 85.7 85.7 85.2 87.3  PLT 365 372 367 305 338   Blood Culture    Component Value Date/Time   SDES BLOOD LEFT HAND 05/03/2023 1305   SPECREQUEST  05/03/2023 1305    AEROBIC BOTTLE ONLY Blood Culture results may not be optimal due to an inadequate volume of blood received in culture bottles   CULT  05/03/2023 1305    NO GROWTH 5 DAYS Performed at [facility], [address], [place], [place] [address]     REPTSTATUS 05/08/2023 FINAL 05/03/2023 1305    Cardiac Enzymes: No results for input(s): "CKTOTAL", "CKMB", "CKMBINDEX", "TROPONINI" in the last 168 hours. CBG: Recent Labs  Lab 05/16/23 1341 05/16/23 2101 05/16/23 2344 05/17/23 0413 05/17/23 0444  GLUCAP 159* 123* 160* 68* 93   Iron Studies: No results for input(s): "IRON", "TIBC", "TRANSFERRIN", "FERRITIN" in the last 72 hours. @lablastinr3 @ Studies/Results: No results found. Medications:   Chlorhexidine Gluconate Cloth  6 each Topical Q0600   Chlorhexidine Gluconate Cloth  6 each Topical Q0600   darbepoetin (ARANESP) injection - DIALYSIS  60 mcg Subcutaneous Q Fri-1800   doxercalciferol  4 mcg Intravenous Q M,W,F-HD   dronabinol  5 mg Oral BID AC   feeding supplement  237 mL Oral TID BM   ferrous sulfate  325 mg Oral BID WC   heparin  5,000 Units Subcutaneous Q8H   insulin aspart  0-6 Units Subcutaneous TID WC   loperamide  2 mg Oral TID   pantoprazole  40 mg Oral QHS   polycarbophil  1,250 mg Oral BID   sevelamer carbonate  0.8 g Oral TID WC    Dialysis Orders: [facility] MWF  3h  400/1.5  49kg   R AVF  Hep none - last OP HD  9/16, post wt 51kg, coming off 2- 4kg over the last 3 wks - mircera IV q 2 wks, last 9/13, due 9/27 - hectorol 4 mcg IV three times per week.   Assessment/Plan: # Abdominal pain - partial SBO s/p ex-lap with extended ileo-cecectomy with end ileostomy. Per primary/Gen surgery.    #Sepsis 2/2 to ischemic Colitis - completed IV abx on 9/28. Per pmd.    # ESRD - on HD MWF. HD today   # HD access - we were unable to cannulate AV fistula on 9/27 because of pulling clots.  IR placed temporary HD catheter and pt received dialysis on 9/27.  Duplex US of AVF showed low blood flow, vascular surgeon and IR were consulted.  Pt went for fistulogram 10/1 by IR. There was a R cephalic stenosis treated w/ venoplasty, and a central stenosis also rx'd w/ venoplasty. 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 This area was marked and we are not to stick within the circular marking. The rest of the AVF is good to stick; did well w/ HD on 10/02.    # HTN/Volume - no vol excess on exam. She has had sig diarrheal losses though while here. Did not tolerate fluid removal attempt w/ last HD. Keeping even on HD today, no UF.   # Anemia due to ESRD- Hgb 7.9. continue ESA.  Monitor hemoglobin.   # CKD-MBD - sevelamer for hyperphosphatemia.  Continue hectorol. Sensipar on hold due to GI issues as above. Marland Kitchen    #PAD - bilat AKA  Rogers Blocker, PA-C 05/17/2023, 8:03 AM  [facility] Pager: [phone]

## 2023-05-17 NOTE — Progress Notes (Signed)
Received patient in bed to unit.    Informed consent signed and in chart.    TX duration: 1.5 hrs     Transported back to floor  Hand-off given to patient's nurse.   Access used:  lt internal jugular cvc Access issues:   Total UF removed: 0       Maple Hudson, RN Dialysis Unit

## 2023-05-17 NOTE — Plan of Care (Signed)
  Problem: Coping: Goal: Ability to adjust to condition or change in health will improve Outcome: Progressing   Problem: Fluid Volume: Goal: Ability to maintain a balanced intake and output will improve Outcome: Progressing   Problem: Health Behavior/Discharge Planning: Goal: Ability to identify and utilize available resources and services will improve Outcome: Progressing Goal: Ability to manage health-related needs will improve Outcome: Progressing   Problem: Metabolic: Goal: Ability to maintain appropriate glucose levels will improve Outcome: Progressing   Problem: Nutritional: Goal: Maintenance of adequate nutrition will improve Outcome: Progressing Goal: Progress toward achieving an optimal weight will improve Outcome: Progressing   Problem: Skin Integrity: Goal: Risk for impaired skin integrity will decrease Outcome: Progressing   Problem: Tissue Perfusion: Goal: Adequacy of tissue perfusion will improve Outcome: Progressing   Problem: Education: Goal: Knowledge of General Education information will improve Description: Including pain rating scale, medication(s)/side effects and non-pharmacologic comfort measures Outcome: Progressing   Problem: Health Behavior/Discharge Planning: Goal: Ability to manage health-related needs will improve Outcome: Progressing   Problem: Clinical Measurements: Goal: Ability to maintain clinical measurements within normal limits will improve Outcome: Progressing Goal: Will remain free from infection Outcome: Progressing Goal: Diagnostic test results will improve Outcome: Progressing Goal: Respiratory complications will improve Outcome: Progressing Goal: Cardiovascular complication will be avoided Outcome: Progressing   Problem: Activity: Goal: Risk for activity intolerance will decrease Outcome: Progressing   Problem: Nutrition: Goal: Adequate nutrition will be maintained Outcome: Progressing   Problem: Coping: Goal:  Level of anxiety will decrease Outcome: Progressing   Problem: Elimination: Goal: Will not experience complications related to bowel motility Outcome: Progressing Goal: Will not experience complications related to urinary retention Outcome: Progressing   Problem: Pain Managment: Goal: General experience of comfort will improve Outcome: Progressing   Problem: Safety: Goal: Ability to remain free from injury will improve Outcome: Progressing   Problem: Skin Integrity: Goal: Risk for impaired skin integrity will decrease Outcome: Progressing   Problem: Education: Goal: Knowledge of disease and its progression will improve Outcome: Progressing   Problem: Fluid Volume: Goal: Compliance with measures to maintain balanced fluid volume will improve Outcome: Progressing   Problem: Health Behavior/Discharge Planning: Goal: Ability to manage health-related needs will improve Outcome: Progressing   Problem: Nutritional: Goal: Ability to make healthy dietary choices will improve Outcome: Progressing   Problem: Clinical Measurements: Goal: Complications related to the disease process, condition or treatment will be avoided or minimized Outcome: Progressing

## 2023-05-17 NOTE — Progress Notes (Signed)
Patient ID: [name], female   DOB: [birth year], 42 y.o.   MRN: [id number] [facility] Progress Note  13 Days Post-Op  Subjective: CC-  Tolerating diet but only eating small bites here and there. Refusing protein shakes. Denies any nausea or vomiting and states that abdominal pain is stable. 800cc output from ileostomy last 24hr.  Objective: Vital signs in last 24 hours: Temp:  [98 F (36.7 C)-98.7 F (37.1 C)] 98.7 F (37.1 C) (10/04 0801) Pulse Rate:  [86-93] 89 (10/04 0411) Resp:  [16-18] 16 (10/04 0801) BP: (83-101)/(56-62) 96/59 (10/04 0801) SpO2:  [100 %] 100 % (10/04 0411) Weight:  [29 kg] 29 kg (10/04 0442) Last BM Date : 05/16/23  Intake/Output from previous day: 10/03 0701 - 10/04 0700 In: 280 [P.O.:280] Out: 400 [Stool:400] Intake/Output this shift: No intake/output data recorded.  PE: General: NAD Abd: soft, NT, ND, ileostomy viable with about some thicker stool in pouch as well as thin bilious output, cdi dressing to midline wound - pt refuses to let me take this down this morning  Lab Results:  Recent Labs    05/16/23 2011 05/17/23 0556  WBC 15.8* 14.2*  HGB 7.3* 7.9*  HCT 24.1* 26.8*  PLT 305 338   BMET Recent Labs    05/16/23 2011 05/17/23 0556  NA 135 135  K 4.7 4.6  CL 94* 91*  CO2 28 29  GLUCOSE 120* 90  BUN 15 19  CREATININE 4.34* 4.91*  CALCIUM 9.2 9.2   PT/INR Recent Labs    05/14/23 1057  LABPROT 21.6*  INR 1.9*   CMP     Component Value Date/Time   NA 135 05/17/2023 0556   NA 133 (L) 05/18/2020 0927   K 4.6 05/17/2023 0556   CL 91 (L) 05/17/2023 0556   CO2 29 05/17/2023 0556   GLUCOSE 90 05/17/2023 0556   BUN 19 05/17/2023 0556   BUN 45 (H) 05/18/2020 0927   CREATININE 4.91 (H) 05/17/2023 0556   CALCIUM 9.2 05/17/2023 0556   CALCIUM 12.1 (H) 11/13/2021 2026   PROT 6.2 (L) 05/01/2023 1020   PROT 7.6 05/18/2020 0927   ALBUMIN 1.9 (L) 05/15/2023 0844   ALBUMIN 3.9 05/18/2020 0927   AST 26 05/01/2023  1020   ALT 18 05/01/2023 1020   ALKPHOS 72 05/01/2023 1020   BILITOT 0.9 05/01/2023 1020   BILITOT <0.2 05/18/2020 0927   GFRNONAA 11 (L) 05/17/2023 0556   GFRAA 6 (L) 05/18/2020 0927   Lipase     Component Value Date/Time   LIPASE 40 12/20/2022 0918       Studies/Results: No results found.  Anti-infectives: Anti-infectives (From admission, onward)    Start     Dose/Rate Route Frequency Ordered Stop   05/08/23 2200  cefTRIAXone (ROCEPHIN) 2 g in sodium chloride 0.9 % 100 mL IVPB        2 g 200 mL/hr over 30 Minutes Intravenous Every 24 hours 05/08/23 1613 05/11/23 2232   05/08/23 2200  metroNIDAZOLE (FLAGYL) IVPB 500 mg        500 mg 100 mL/hr over 60 Minutes Intravenous Every 12 hours 05/08/23 1613 05/11/23 2346   05/04/23 0200  cefTRIAXone (ROCEPHIN) 2 g in sodium chloride 0.9 % 100 mL IVPB        2 g 200 mL/hr over 30 Minutes Intravenous Every 24 hours 05/04/23 0102 05/08/23 0144   05/04/23 0200  metroNIDAZOLE (FLAGYL) IVPB 500 mg        500 mg 100 mL/hr  over 60 Minutes Intravenous Every 12 hours 05/04/23 0102 05/09/23 1203   04/30/23 1800  ciprofloxacin (CIPRO) IVPB 400 mg  Status:  Discontinued        400 mg 200 mL/hr over 60 Minutes Intravenous Every 24 hours 04/30/23 1443 05/02/23 1140   04/30/23 1515  metroNIDAZOLE (FLAGYL) IVPB 500 mg  Status:  Discontinued        500 mg 100 mL/hr over 60 Minutes Intravenous Every 12 hours 04/30/23 1418 05/02/23 1140        Assessment/Plan 42 yo female presenting with abdominal pain, diarrhea, low grade fevers, dilated small bowel and colonic wall thickening on CT   POD#13 s/p diagnostic lap converted to ex-lap with extended ileo-cecectomy (mid-ileum to just past cecum), 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 Hillery Hunter - Intra-op findings were notable for adhesions in the RLQ w/ associated twisting of the mesentery resulting in SB ischemia. - CT 9/26 ordered due to leukocytosis but overall looks ok, some residual small bowel thickening but  no large abscess. Off IV abx as of 9/28. - Dakin's wet to dry dressings >> 10/3. Now wet to dry - Has had high volume ileostomy output, 400cc last 24hr. Continue BID iron, BID fiber, and TID imodium  - mobilize, continue therapies - rec SNF - Tolerating diet but not taking in much at all. Trialing Marinol   FEN: reg, Glucerna ID: Rocephin/Flagyl 9/21 > 9/28 VTE: SCDs, SQH q 8h Foley: out    LOS: 17 days   Marin Olp, MD [facility]

## 2023-05-17 NOTE — Progress Notes (Signed)
PROGRESS NOTE [name]  KGM:[id number] DOB: 1981-02-22 DOA: 04/30/2023 PCP: [facility]  Brief Narrative/Hospital Course: 42 y.o. 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  Onset day prior to admission associated with diarrhea. In the ED, patient was febrile, tachycardic with lactic acid 1.9 followed by 2.5. Chest x-ray>findings of atelectasis versus infiltrate. CT abdomen/pelvis with contrast>>shows evidence of a small bowel obstruction with dilated ileal segment up to 4 cm, sigmoid segment consistent with colitis, diffuse gastric wall thickening, cholelithiasis with mild chronic prominence of the common bile duct.  General surgery was consulted and admitted for further management of small bowel obstruction  partial Nephrology has been consulted for dialysis aVF clotted off S/P TDC.  IR fistulogram and balloon angioplasty 05/14/23.    Subjective: Patient seen and examined this morning Overnight afebrile  Again episode of hypoglycemia at 68> SSI changed to q. ACH S, cbg stable this morning Labs with improving WBC count stable bmp 24 hr Ileostomy output 800> 400 cc She feels fine, has mild pain  Assessment and Plan: Principal Problem:   SBO (small bowel obstruction) (HCC) Active Problems:   Ischemic disease of gut (HCC)   Protein-calorie malnutrition, moderate (HCC)   Partial SBO Abdominal pain Sepsis Acute pancreatitis High output ostomy: Patient presenting with abdominal pain found to have elevated lactic acidosis, febrile with CT abdomen/pelvis consistent with small bowel obstruction and colitis>symptoms continued to  progress despite IV antibiotics and conservative measures. 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 difficile antigen positive, toxin negative. GI PCR panel negative. CT 9/26>obtained due to leukocytosis, looked okay some residual small bowel thickening but no large abscess, antibiotics discontinued 9/28,Wound VAC removed 9/28. Tolerating p.o. but not eating well, having ileostomy output and stable, surgery following added Marinol to stimulate appetite.  Dakins wet-to-dry dressing changed to wet-to-dry 05/16/23 Monitor ileostomy output.  WBC improving. Recent Labs  Lab 05/10/23 1932 05/11/23 0232 05/12/23 0354 05/13/23 0807 05/14/23 1057 05/14/23 2227 05/15/23 0844 05/16/23 2011 05/17/23 0556  WBC  --    < > 21.8*   < > 14.3* 15.5* 16.5* 15.8* 14.2*  LATICACIDVEN 1.5  --   --   --   --   --   --   --   --   PROCALCITON  --   --  4.89  --   --   --   --   --   --    < > = values in this interval not displayed.     ESRD HD MWF Hypertension/volume: nephrology following.aVF clotted off S/P TDC.IR fistulogram and balloon angioplasty of cephalic vein and central vein down 05/14/23.  Last HD 10/2.  Metabolic bone disease of CKD: Continue sevelamer for hyperphosphatemia continue IV VRDRA and Sensipar per nephrology  Anemia of ESRD: Hemoglobin stable continue ESA, and monitor.   Recent Labs  Lab 05/14/23 1057 05/14/23 2227 05/15/23 0844 05/16/23 2011 05/17/23 0556  HGB 7.9* 7.6* 7.3* 7.3* 7.9*  HCT 25.3* 24.6* 23.4* 24.1* 26.8*    Hypotension-Intradialytic. Essential hypertension PTA on isosorbide mononitrate 30 mg p.o. daily. Hold due to borderline BP  Type 1  diabetes mellitus: Hypoglcyemic- semglee has been discontinued. PTA on Semglee 12 units [place] BID. Cont ssi.  Again encourage oral intake. Recent Labs  Lab 05/16/23 2344 05/17/23 0413 05/17/23 0444 05/17/23 0800 05/17/23 1134  GLUCAP 160*  68* 93 80 106*   PVD s/p b/l BKA HLD Holding atorvastatin for now    Goals of care On 05/08/2023, patient discussed with multiple medical providers and staff that she did not want to continue with aggressive care to include hemodialysis in which she signed off early yesterday.  She wanted to focus more on comfort.  But as of 05/09/2023 she reports ""I want to live" and I will continue hemodialysis at this time.  Seen by palliative care with recommendations of full code, full scope of care at this time.  Pressure injury POA stage II sacrum-see below Pressure Injury 04/30/23 Sacrum Mid Stage 2 -  Partial thickness loss of dermis presenting as a shallow open injury with a red, pink wound bed without slough. 5 cm x 7 cm pink dry (Active)  04/30/23   Location: Sacrum  Location Orientation: Mid  Staging: Stage 2 -  Partial thickness loss of dermis presenting as a shallow open injury with a red, pink wound bed without slough.  Wound Description (Comments): 5 cm x 7 cm pink dry  Present on Admission: Yes  Dressing Type Foam - Lift dressing to assess site every shift 05/12/23 0800    DVT prophylaxis: heparin injection 5,000 Units Start: 04/30/23 0900 Code Status:   Code Status: Full Code Family Communication: plan of care discussed with patient at bedside. Patient status is: Inpatient because of pSBO Level of care: Med-Surg   Dispo: The patient is from: snf            Anticipated disposition: SNF once stable and cleared by surgery  Objective: Vitals last 24 hrs: Vitals:   05/16/23 2348 05/17/23 0411 05/17/23 0442 05/17/23 0801  BP: 98/61 (!) 94/58  (!) 96/59  Pulse: 86 89    Resp: 18 18  16   Temp: 98.2 F (36.8 C) 98 F (36.7 C)  98.7 F (37.1 C)  TempSrc: Oral Oral  Oral  SpO2: 100% 100%    Weight:   29 kg    Weight change:   Physical Examination: General exam: alert awake, ill appearing, frail HEENT:Oral mucosa moist, Ear/Nose WNL grossly Respiratory system: Bilaterally clear  BS,no use of accessory muscle Cardiovascular system: S1 & S2 +, No JVD. Gastrointestinal system: Abdomen soft, ilestomy w/ thick stool, thin bilious output, dressing present on the midline wound  Nervous System: Alert, awake, moving all extremities,and following commands. Extremities:b/l BKA Skin: No rashes,no icterus. MSK: Normal muscle bulk,tone, power   Medications reviewed:  Scheduled Meds:  Chlorhexidine Gluconate Cloth  6 each Topical Q0600   Chlorhexidine Gluconate Cloth  6 each Topical Q0600   darbepoetin (ARANESP) injection - DIALYSIS  60 mcg Subcutaneous Q Fri-1800   doxercalciferol  4 mcg Intravenous Q M,W,F-HD   dronabinol  5 mg Oral BID AC   feeding supplement  237 mL Oral TID BM   ferrous sulfate  325 mg Oral BID WC   heparin  5,000 Units Subcutaneous Q8H   insulin aspart  0-6 Units Subcutaneous TID WC   loperamide  2 mg Oral TID   pantoprazole  40 mg Oral QHS   polycarbophil  1,250 mg Oral BID   sevelamer carbonate  0.8 g Oral TID WC   Continuous Infusions:   Diet Order  Diet regular Room service appropriate? Yes; Fluid consistency: Thin  Diet effective now                   Intake/Output Summary (Last 24 hours) at 05/17/2023 1318 Last data filed at 05/17/2023 1210 Gross per 24 hour  Intake 594 ml  Output 1000 ml  Net -406 ml   Net IO Since Admission: -2,619.65 mL [05/17/23 1318]  Wt Readings from Last 3 Encounters:  05/17/23 29 kg  03/17/23 59 kg  12/28/22 53.6 kg     Unresulted Labs (From admission, onward)     Start     Ordered   05/16/23 0500  Basic metabolic panel  Daily,   R     Question:  Specimen collection method  Answer:  Lab=Lab collect   05/15/23 1302   05/16/23 0500  CBC  Daily,   R     Question:  Specimen collection method  Answer:  Lab=Lab collect   05/15/23 1302          Data Reviewed: I have personally reviewed following labs and imaging studies CBC: Recent Labs  Lab 05/14/23 1057 05/14/23 2227  05/15/23 0844 05/16/23 2011 05/17/23 0556  WBC 14.3* 15.5* 16.5* 15.8* 14.2*  HGB 7.9* 7.6* 7.3* 7.3* 7.9*  HCT 25.3* 24.6* 23.4* 24.1* 26.8*  MCV 85.2 85.7 85.7 85.2 87.3  PLT 365 372 367 305 338   Basic Metabolic Panel: Recent Labs  Lab 05/10/23 1950 05/11/23 0232 05/13/23 0807 05/14/23 2228 05/15/23 0844 05/16/23 2011 05/17/23 0556  NA 135 134* 137 134* 133* 135 135  K 4.5 3.5 4.1 4.4 4.4 4.7 4.6  CL 97* 95* 100 93* 92* 94* 91*  CO2 22 28 23 29 26 28 29   GLUCOSE 107* 80 173* 109* 76 120* 90  BUN 41* 14 28* 17 20 15 19   CREATININE 6.74* 3.11* 6.02* 4.37* 4.76* 4.34* 4.91*  CALCIUM 8.5* 8.3* 8.4* 9.0 8.8* 9.2 9.2  PHOS 7.0* 3.2 5.7* 4.9* 5.1*  --   --    GFR: Estimated Creatinine Clearance: 6.9 mL/min (A) (by C-G formula based on SCr of 4.91 mg/dL (H)). Liver Function Tests: Recent Labs  Lab 05/10/23 1950 05/11/23 0232 05/13/23 0807 05/14/23 2228 05/15/23 0844  ALBUMIN 1.9* 1.9* 1.7* 2.0* 1.9*   Recent Labs  Lab 05/14/23 1057  INR 1.9*  CBG: Recent Labs  Lab 05/16/23 2344 05/17/23 0413 05/17/23 0444 05/17/23 0800 05/17/23 1134  GLUCAP 160* 68* 93 80 106*   Recent Labs  Lab 05/10/23 1932 05/12/23 0354  PROCALCITON  --  4.89  LATICACIDVEN 1.5  --     No results found for this or any previous visit (from the past 240 hour(s)).  Antimicrobials: Anti-infectives (From admission, onward)    Start     Dose/Rate Route Frequency Ordered Stop   05/08/23 2200  cefTRIAXone (ROCEPHIN) 2 g in sodium chloride 0.9 % 100 mL IVPB        2 g 200 mL/hr over 30 Minutes Intravenous Every 24 hours 05/08/23 1613 05/11/23 2232   05/08/23 2200  metroNIDAZOLE (FLAGYL) IVPB 500 mg        500 mg 100 mL/hr over 60 Minutes Intravenous Every 12 hours 05/08/23 1613 05/11/23 2346   05/04/23 0200  cefTRIAXone (ROCEPHIN) 2 g in sodium chloride 0.9 % 100 mL IVPB        2 g 200 mL/hr over 30 Minutes Intravenous Every 24 hours 05/04/23 0102 05/08/23 0144   05/04/23 0200   metroNIDAZOLE (  FLAGYL) IVPB 500 mg        500 mg 100 mL/hr over 60 Minutes Intravenous Every 12 hours 05/04/23 0102 05/09/23 1203   04/30/23 1800  ciprofloxacin (CIPRO) IVPB 400 mg  Status:  Discontinued        400 mg 200 mL/hr over 60 Minutes Intravenous Every 24 hours 04/30/23 1443 05/02/23 1140   04/30/23 1515  metroNIDAZOLE (FLAGYL) IVPB 500 mg  Status:  Discontinued        500 mg 100 mL/hr over 60 Minutes Intravenous Every 12 hours 04/30/23 1418 05/02/23 1140      Culture/Microbiology    Component Value Date/Time   SDES BLOOD LEFT HAND 05/03/2023 1305   SPECREQUEST  05/03/2023 1305    AEROBIC BOTTLE ONLY Blood Culture results may not be optimal due to an inadequate volume of blood received in culture bottles   CULT  05/03/2023 1305    NO GROWTH 5 DAYS Performed at [facility], [address], [place], [place] [address]    REPTSTATUS 05/08/2023 FINAL 05/03/2023 1305  Radiology Studies: No results found.   LOS: 17 days   Lanae Boast, MD [facility]  05/17/2023, 1:18 PM

## 2023-05-17 NOTE — Progress Notes (Signed)
[facility] night cross cover note:   I was notified by RN of the patient's hypoglycemic CBG, with result of 68.  This improved to 96 with interval oral intake.  This occurred after she received 1 unit of sliding scale insulin at 2 AM for a CBG of 160 at that time.   Per my brief chart review, she has current CBG monitoring on a every 4 hour basis with every 6 hours sliding scale insulin.  For now, I have changed her CBG monitoring to before every meal and at bedtime with before every meal sliding scale insulin and no nightly coverage.      Newton Pigg, DO Hospitalist

## 2023-05-17 NOTE — Progress Notes (Signed)
BS check at 4 am was 68. Patient took 120 ml of juice and BS recheck was 96. Newton Pigg, DO was notified.    Gyanna Jarema

## 2023-05-18 ENCOUNTER — Inpatient Hospital Stay (HOSPITAL_COMMUNITY): Payer: Medicare Other

## 2023-05-18 DIAGNOSIS — K56609 Unspecified intestinal obstruction, unspecified as to partial versus complete obstruction: Secondary | ICD-10-CM | POA: Diagnosis not present

## 2023-05-18 LAB — GLUCOSE, CAPILLARY
Glucose-Capillary: 92 mg/dL (ref 70–99)
Glucose-Capillary: 60 mg/dL — ABNORMAL LOW (ref 70–99)
Glucose-Capillary: 82 mg/dL (ref 70–99)
Glucose-Capillary: 132 mg/dL — ABNORMAL HIGH (ref 70–99)
Glucose-Capillary: 188 mg/dL — ABNORMAL HIGH (ref 70–99)
Glucose-Capillary: 192 mg/dL — ABNORMAL HIGH (ref 70–99)

## 2023-05-18 LAB — BASIC METABOLIC PANEL
Anion gap: 20 — ABNORMAL HIGH (ref 5–15)
BUN: 20 mg/dL (ref 6–20)
CO2: 19 mmol/L — ABNORMAL LOW (ref 22–32)
Calcium: 9 mg/dL (ref 8.9–10.3)
Chloride: 89 mmol/L — ABNORMAL LOW (ref 98–111)
Creatinine, Ser: 4.81 mg/dL — ABNORMAL HIGH (ref 0.44–1.00)
GFR, Estimated: 11 mL/min — ABNORMAL LOW (ref >60)
Glucose, Bld: 191 mg/dL — ABNORMAL HIGH (ref 70–99)
Potassium: 5.2 mmol/L — ABNORMAL HIGH (ref 3.5–5.1)
Sodium: 128 mmol/L — ABNORMAL LOW (ref 135–145)

## 2023-05-18 LAB — CBC
HCT: 26.1 % — ABNORMAL LOW (ref 36.0–46.0)
Hemoglobin: 7.8 g/dL — ABNORMAL LOW (ref 12.0–15.0)
MCH: 26.2 pg (ref 26.0–34.0)
MCHC: 29.9 g/dL — ABNORMAL LOW (ref 30.0–36.0)
MCV: 87.6 fL (ref 80.0–100.0)
Platelets: 342 10*3/uL (ref 150–400)
RBC: 2.98 MIL/uL — ABNORMAL LOW (ref 3.87–5.11)
RDW: 19.5 % — ABNORMAL HIGH (ref 11.5–15.5)
WBC: 20.5 10*3/uL — ABNORMAL HIGH (ref 4.0–10.5)
nRBC: 0.1 % (ref 0.0–0.2)

## 2023-05-18 LAB — CORTISOL: Cortisol, Plasma: 27.6 ug/dL

## 2023-05-18 LAB — TSH: TSH: 2.684 u[IU]/mL (ref 0.350–4.500)

## 2023-05-18 MED ORDER — MIDODRINE HCL 5 MG PO TABS
10.0000 mg | ORAL_TABLET | Freq: Three times a day (TID) | ORAL | Status: DC
  Administered 2023-05-18 – 2023-05-24 (×18): 10 mg via ORAL
  Filled 2023-05-18 (×18): qty 2

## 2023-05-18 MED ORDER — IOHEXOL 350 MG/ML SOLN
75.0000 mL | Freq: Once | INTRAVENOUS | Status: AC | PRN
  Administered 2023-05-18: 75 mL via INTRAVENOUS

## 2023-05-18 MED ORDER — SODIUM CHLORIDE 0.9 % IV SOLN: INTRAVENOUS | Status: DC

## 2023-05-18 MED ORDER — DEXTROSE-SODIUM CHLORIDE 5-0.9 % IV SOLN: INTRAVENOUS | Status: AC

## 2023-05-18 MED ORDER — IOHEXOL 9 MG/ML PO SOLN
500.0000 mL | ORAL | Status: AC
  Administered 2023-05-18 (×2): 500 mL via ORAL

## 2023-05-18 MED ORDER — MIDODRINE HCL 5 MG PO TABS
5.0000 mg | ORAL_TABLET | ORAL | Status: AC
  Administered 2023-05-18: 5 mg via ORAL
  Filled 2023-05-18: qty 1

## 2023-05-18 NOTE — Progress Notes (Signed)
   05/18/23 1411  Vitals  Temp 98.7 F (37.1 C)  Temp Source Oral  BP 104/63  MAP (mmHg) 76  BP Location Left Arm  BP Method Manual  Patient Position (if appropriate) Lying  ECG Heart Rate 96  Resp 18  Oxygen Therapy  O2 Device Room Air   Patient arrived from 2W to 4E01, patient placed on monitor vital signs obatined and CCMD made aware.bedside RN aware. Allaya Abbasi, Randall An RN

## 2023-05-18 NOTE — Progress Notes (Signed)
[facility] Progress Note   Subjective:   Noted patient remains hypotensive. On exam she repeats she is in pain "everywhere," difficult to redirect. Denies SOB. Per I&Os, she continues to have large amount of stool output.   Objective Vitals:   05/17/23 2013 05/18/23 0322 05/18/23 0405 05/18/23 0442  BP: (!) 99/58 (!) 79/54 (!) 93/58   Pulse: 93 97 94   Resp: 18 18 18    Temp: 98.1 F (36.7 C) 97.9 F (36.6 C) 98.6 F (37 C)   TempSrc: Oral Oral Oral   SpO2: (!) 61% (!) 63% 90%   Weight:    48 kg   Physical Exam General: Chronically ill appearing female, alert, NAD Heart: RRR, no murmurs, rubs or gallops Lungs: CTA bilaterally Abdomen: non-distended Extremities: B/L BKA, no peripheral edema Dialysis Access:  RUE AVF  + bruit  Additional Objective Labs: Basic Metabolic Panel: Recent Labs  Lab 05/13/23 0807 05/14/23 2228 05/15/23 0844 05/16/23 2011 05/17/23 0556  NA 137 134* 133* 135 135  K 4.1 4.4 4.4 4.7 4.6  CL 100 93* 92* 94* 91*  CO2 23 29 26 28 29   GLUCOSE 173* 109* 76 120* 90  BUN 28* 17 20 15 19   CREATININE 6.02* 4.37* 4.76* 4.34* 4.91*  CALCIUM 8.4* 9.0 8.8* 9.2 9.2  PHOS 5.7* 4.9* 5.1*  --   --    Liver Function Tests: Recent Labs  Lab 05/13/23 0807 05/14/23 2228 05/15/23 0844  ALBUMIN 1.7* 2.0* 1.9*   No results for input(s): "LIPASE", "AMYLASE" in the last 168 hours. CBC: Recent Labs  Lab 05/14/23 1057 05/14/23 2227 05/15/23 0844 05/16/23 2011 05/17/23 0556  WBC 14.3* 15.5* 16.5* 15.8* 14.2*  HGB 7.9* 7.6* 7.3* 7.3* 7.9*  HCT 25.3* 24.6* 23.4* 24.1* 26.8*  MCV 85.2 85.7 85.7 85.2 87.3  PLT 365 372 367 305 338   Blood Culture    Component Value Date/Time   SDES BLOOD LEFT HAND 05/03/2023 1305   SPECREQUEST  05/03/2023 1305    AEROBIC BOTTLE ONLY Blood Culture results may not be optimal due to an inadequate volume of blood received in culture bottles   CULT  05/03/2023 1305    NO GROWTH 5 DAYS Performed at [facility], [address] [place], [place] [address]    REPTSTATUS 05/08/2023 FINAL 05/03/2023 1305    Cardiac Enzymes: No results for input(s): "CKTOTAL", "CKMB", "CKMBINDEX", "TROPONINI" in the last 168 hours. CBG: Recent Labs  Lab 05/17/23 0444 05/17/23 0800 05/17/23 1134 05/17/23 2018 05/18/23 0458  GLUCAP 93 80 106* 108* 92   Iron Studies: No results for input(s): "IRON", "TIBC", "TRANSFERRIN", "FERRITIN" in the last 72 hours. @lablastinr3 @ Studies/Results: No results found. Medications:   Chlorhexidine Gluconate Cloth  6 each Topical Q0600   Chlorhexidine Gluconate Cloth  6 each Topical Q0600   darbepoetin (ARANESP) injection - DIALYSIS  60 mcg Subcutaneous Q Fri-1800   doxercalciferol  4 mcg Intravenous Q M,W,F-HD   dronabinol  5 mg Oral BID AC   feeding supplement  237 mL Oral TID BM   ferrous sulfate  325 mg Oral BID WC   heparin  5,000 Units Subcutaneous Q8H   insulin aspart  0-6 Units Subcutaneous TID WC   loperamide  2 mg Oral TID   midodrine  5 mg Oral TID WC   pantoprazole  40 mg Oral QHS   polycarbophil  1,250 mg Oral BID   sevelamer carbonate  0.8 g Oral TID WC    Outpatient  Dialysis Orders: [facility] MWF  3h  400/1.5  49kg   R AVF  Hep none - last OP HD 9/16, post wt 51kg, coming off 2- 4kg over the last 3 wks - mircera IV q 2 wks, last 9/13, due 9/27 - hectorol 4 mcg IV three times per week.     Assessment/Plan:  Abdominal pain - partial SBO s/p ex-lap with extended ileo-cecectomy with end ileostomy. Per primary/Gen surgery.    #Sepsis 2/2 to ischemic Colitis - completed IV abx on 9/28. Per pmd.    # ESRD - on HD MWF. Continue MWF schedule.    # HD access - we were unable to cannulate AV fistula on 9/27 because of pulling clots.  IR placed temporary HD catheter and pt received dialysis on 9/27.  Duplex US of AVF showed low blood flow, vascular surgeon and IR were consulted.  Pt went for fistulogram 10/1 by IR. There was a R cephalic  stenosis treated w/ venoplasty, and a central stenosis also rx'd w/ venoplasty. 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 This area was marked and we are not to stick within the circular marking. The rest of the AVF is good to stick; did well w/ HD on 10/02.    # HTN/Volume - no vol excess on exam. She has had sig diarrheal losses though while here. Did not tolerate fluid removal earlier this week, no UF with HD Friday. BP remains soft. Surgery is checking CT, discussed with Dr. Arlean Hopping, will also start normal saline at 22ml/hr for , follow BP/CXR results.    # Anemia due to ESRD- Hgb 7.9. continue ESA.  Monitor hemoglobin.   # CKD-MBD - Continue sevelamer for hyperphosphatemia.  Continue hectorol. Sensipar on hold due to GI issues as above. Rogers Blocker, PA-C 05/18/2023, 8:11 AM  [facility] Pager: [phone]

## 2023-05-18 NOTE — Progress Notes (Signed)
PROGRESS NOTE [name]  EXB:[id number] DOB: Feb 21, 1981 DOA: 04/30/2023 PCP: [facility]  Brief Narrative/Hospital Course: 42 y.o. 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  Onset day prior to admission associated with diarrhea. In the ED, patient was febrile, tachycardic with lactic acid 1.9 followed by 2.5. Chest x-ray>findings of atelectasis versus infiltrate. CT abdomen/pelvis with contrast>>shows evidence of a small bowel obstruction with dilated ileal segment up to 4 cm, sigmoid segment consistent with colitis, diffuse gastric wall thickening, cholelithiasis with mild chronic prominence of the common bile duct.  General surgery was consulted and admitted for further management of small bowel obstruction  partial Nephrology has been consulted for dialysis aVF clotted off S/P TDC.  IR fistulogram and balloon angioplasty 05/14/23.    Subjective: Patient seen and examined  BP soft in 80s on recheck improved 101- has been started ON MIDODRINE 10/4 and was increased this morning Having hypoglycemia upto 60. Now at 82. She is alert awake, able to tell me her name but keeps complaining I am in pain SpO2 detected as hypoxia 61% room air, is at her after changing probe to ear lobule showing 99 to 100% on room air 24 hr Ileostomy output 800> 400 cc> 750 cc Colostomy has a large amount of stool output  Assessment and Plan: Principal Problem:   SBO (small bowel obstruction) (HCC) Active Problems:   Ischemic disease of gut (HCC)   Protein-calorie malnutrition, moderate (HCC)   Partial SBO Abdominal pain Sepsis Acute pancreatitis High  output ostomy: Patient presenting with abdominal pain found to have elevated lactic acidosis, febrile with CT abdomen/pelvis consistent with small bowel obstruction and colitis>symptoms continued to progress despite IV antibiotics and conservative measures. 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 difficile antigen positive, toxin negative. GI PCR panel negative. CT 9/26>obtained due to leukocytosis, looked okay some residual small bowel thickening but no large abscess, antibiotics discontinued 9/28,Wound VAC removed 9/28.Dakins wet-to-dry dressing changed to wet-to-dry 05/16/23 Tolerating diet but not taking p.o. discussed with surgery still has ileostomy output, planning to get CT abdomen pelvis With her poor oral intake discussed with nephrology> starting on D5 NS x 500 cc. On dronabinol for appetite. Continue diet as per surgery. Labs pending today Recent Labs  Lab 05/12/23 0354 05/13/23 0807 05/14/23 1057 05/14/23 2227 05/15/23 0844 05/16/23 2011 05/17/23 0556  WBC 21.8*   < > 14.3* 15.5* 16.5* 15.8* 14.2*  PROCALCITON 4.89  --   --   --   --   --   --    < > = values in this interval not displayed.    ESRD HD MWF Hypertension/volume: Nephrology following.aVF clotted off S/P TDC.IR fistulogram and balloon angioplasty of cephalic vein and central vein down 05/14/23.  Last HD 10/4 -limited by hypotension  Metabolic bone disease of CKD: Continue sevelamer for hyperphosphatemia continue IV VRDRA and Sensipar per nephrology  Anemia of ESRD: HB holding stable in  7.9. Recent Labs  Lab 05/14/23 1057 05/14/23 2227 05/15/23 0844 05/16/23 2011 05/17/23 0556  HGB 7.9* 7.6* 7.3* 7.3* 7.9*  HCT 25.3* 24.6* 23.4* 24.1* 26.8*  Hypotension Essential hypertension: PTA on isosorbide mononitrate 30 mg p.o. daily. Hold due to borderline BP Midodrine started 10/4 dose increased to 10 mg 3 times daily 10/5, bp  in 100s today.  Type 1 diabetes mellitus: Hypoglcyemic due to poor oral intake.  Insulin been discontinued. PTA on Semglee 12 units [place] BID. Cont ssi.  Adding D5 NS Recent Labs  Lab 05/17/23 1134 05/17/23 2018 05/18/23 0458 05/18/23 0845 05/18/23 0920  GLUCAP 106* 108* 92 60* 82   PVD s/p b/l BKA HLD Holding atorvastatin for now.    Goals of care:On 05/08/2023,patient discussed with multiple medical providers and staff that she did not want to continue with aggressive care to include hemodialysis in which she signed off early yesterday.  She wanted to focus more on comfort.  But as of 05/09/2023 she reports ""I want to live" and I will continue hemodialysis at this time.  Seen by palliative care with recommendations of full code, full scope of care at this time.PMT reconsulted due to ongoing pain, with ongoing multiple comorbid condition and remains sick, prognosis does not appear bright. I called her husband and sister's phone but no answer. Monitor the patient closely we will transfer her to progressive unit given her hypotension.  Pressure injury POA stage II sacrum-see below. Pressure Injury 04/30/23 Sacrum Mid Stage 2 -  Partial thickness loss of dermis presenting as a shallow open injury with a red, pink wound bed without slough. 5 cm x 7 cm pink dry (Active)  04/30/23   Location: Sacrum  Location Orientation: Mid  Staging: Stage 2 -  Partial thickness loss of dermis presenting as a shallow open injury with a red, pink wound bed without slough.  Wound Description (Comments): 5 cm x 7 cm pink dry  Present on Admission: Yes  Dressing Type Foam - Lift dressing to assess site every shift 05/12/23 0800   DVT prophylaxis: heparin injection 5,000 Units Start: 04/30/23 0900 Code Status:   Code Status: Full Code Family Communication: plan of care discussed with patient at bedside. Patient status is: Inpatient because of pSBO Level of care: Med-Surg.   Dispo:The patient is from: snf            Anticipated disposition: SNF once stable and cleared by surgery  Objective: Vitals last 24 hrs: Vitals:   05/18/23 0837 05/18/23 0841 05/18/23 0859 05/18/23 1116  BP: (!) 84/58 (!) 84/58  (!) 101/57  Pulse: (!) 56 (!) 56 (!) 102 100  Resp:  17  17  Temp: 99 F (37.2 C) 99 F (37.2 C)  100.3 F (37.9 C)  TempSrc: Oral Oral  Oral  SpO2:   100% 99%  Weight:      Weight change: 21 kg  Physical Examination: General exam: alert awake,appears older than stated age and looks chronically sick. HEENT:Oral mucosa moist, Ear/Nose WNL grossly. Respiratory system:Bilaterally clear BS,no use of accessory muscle. Cardiovascular system:S1 & S2 +, No JVD. Gastrointestinal system:Abdomen soft, colostomy in place with stool output midline wound with packing in place. Nervous System:Alert, awake, moving arms. Extremities: b/l BKA. Skin:No rashes,no icterus. XBJ:YNWG muscle bulk.  Medications reviewed:  Scheduled Meds:  Chlorhexidine Gluconate Cloth  6 each Topical Q0600   Chlorhexidine Gluconate Cloth  6 each Topical Q0600   darbepoetin (ARANESP) injection - DIALYSIS  60 mcg Subcutaneous Q Fri-1800   doxercalciferol  4 mcg Intravenous Q M,W,F-HD   dronabinol  5 mg Oral BID AC   feeding supplement  237 mL Oral TID BM  ferrous sulfate  325 mg Oral BID WC   heparin  5,000 Units Subcutaneous Q8H   insulin aspart  0-6 Units Subcutaneous TID WC   loperamide  2 mg Oral TID   midodrine  10 mg Oral TID WC   pantoprazole  40 mg Oral QHS   polycarbophil  1,250 mg Oral BID   sevelamer carbonate  0.8 g Oral TID WC   Continuous Infusions:  dextrose 5 % and 0.9 % NaCl 75 mL/hr at 05/18/23 1207    Diet Order             Diet NPO time specified Except for: Sips with Meds  Diet effective now                   Intake/Output Summary (Last 24 hours) at 05/18/2023 1211 Last data filed at 05/18/2023 0850 Gross per 24 hour  Intake 120 ml  Output 149.5 ml  Net -29.5 ml   Net IO Since  Admission: -2,649.15 mL [05/18/23 1211]  Wt Readings from Last 3 Encounters:  05/18/23 48 kg  03/17/23 59 kg  12/28/22 53.6 kg     Unresulted Labs (From admission, onward)     Start     Ordered   05/18/23 0851  CBC  ONCE - STAT,   STAT       Question:  Specimen collection method  Answer:  Lab=Lab collect   05/18/23 0850   05/18/23 0851  Basic metabolic panel  ONCE - STAT,   STAT       Question:  Specimen collection method  Answer:  Lab=Lab collect   05/18/23 0850   05/18/23 0500  Cortisol  Tomorrow morning,   R       Question:  Specimen collection method  Answer:  Lab=Lab collect   05/17/23 1720   05/18/23 0500  TSH  Tomorrow morning,   R       Question:  Specimen collection method  Answer:  Lab=Lab collect   05/17/23 1720   05/16/23 0500  Basic metabolic panel  Daily,   R     Question:  Specimen collection method  Answer:  Lab=Lab collect   05/15/23 1302   05/16/23 0500  CBC  Daily,   R     Question:  Specimen collection method  Answer:  Lab=Lab collect   05/15/23 1302          Data Reviewed: I have personally reviewed following labs and imaging studies CBC: Recent Labs  Lab 05/14/23 1057 05/14/23 2227 05/15/23 0844 05/16/23 2011 05/17/23 0556  WBC 14.3* 15.5* 16.5* 15.8* 14.2*  HGB 7.9* 7.6* 7.3* 7.3* 7.9*  HCT 25.3* 24.6* 23.4* 24.1* 26.8*  MCV 85.2 85.7 85.7 85.2 87.3  PLT 365 372 367 305 338   Basic Metabolic Panel: Recent Labs  Lab 05/13/23 0807 05/14/23 2228 05/15/23 0844 05/16/23 2011 05/17/23 0556  NA 137 134* 133* 135 135  K 4.1 4.4 4.4 4.7 4.6  CL 100 93* 92* 94* 91*  CO2 23 29 26 28 29   GLUCOSE 173* 109* 76 120* 90  BUN 28* 17 20 15 19   CREATININE 6.02* 4.37* 4.76* 4.34* 4.91*  CALCIUM 8.4* 9.0 8.8* 9.2 9.2  PHOS 5.7* 4.9* 5.1*  --   --    GFR: Estimated Creatinine Clearance: 11.4 mL/min (A) (by C-G formula based on SCr of 4.91 mg/dL (H)). Liver Function Tests: Recent Labs  Lab 05/13/23 0807 05/14/23 2228 05/15/23 0844  ALBUMIN  1.7* 2.0* 1.9*  Recent Labs  Lab 05/14/23 1057  INR 1.9*  CBG: Recent Labs  Lab 05/17/23 1134 05/17/23 2018 05/18/23 0458 05/18/23 0845 05/18/23 0920  GLUCAP 106* 108* 92 60* 82   Recent Labs  Lab 05/12/23 0354  PROCALCITON 4.89    No results found for this or any previous visit (from the past 240 hour(s)).  Antimicrobials: Anti-infectives (From admission, onward)    Start     Dose/Rate Route Frequency Ordered Stop   05/08/23 2200  cefTRIAXone (ROCEPHIN) 2 g in sodium chloride 0.9 % 100 mL IVPB        2 g 200 mL/hr over 30 Minutes Intravenous Every 24 hours 05/08/23 1613 05/11/23 2232   05/08/23 2200  metroNIDAZOLE (FLAGYL) IVPB 500 mg        500 mg 100 mL/hr over 60 Minutes Intravenous Every 12 hours 05/08/23 1613 05/11/23 2346   05/04/23 0200  cefTRIAXone (ROCEPHIN) 2 g in sodium chloride 0.9 % 100 mL IVPB        2 g 200 mL/hr over 30 Minutes Intravenous Every 24 hours 05/04/23 0102 05/08/23 0144   05/04/23 0200  metroNIDAZOLE (FLAGYL) IVPB 500 mg        500 mg 100 mL/hr over 60 Minutes Intravenous Every 12 hours 05/04/23 0102 05/09/23 1203   04/30/23 1800  ciprofloxacin (CIPRO) IVPB 400 mg  Status:  Discontinued        400 mg 200 mL/hr over 60 Minutes Intravenous Every 24 hours 04/30/23 1443 05/02/23 1140   04/30/23 1515  metroNIDAZOLE (FLAGYL) IVPB 500 mg  Status:  Discontinued        500 mg 100 mL/hr over 60 Minutes Intravenous Every 12 hours 04/30/23 1418 05/02/23 1140      Culture/Microbiology    Component Value Date/Time   SDES BLOOD LEFT HAND 05/03/2023 1305   SPECREQUEST  05/03/2023 1305    AEROBIC BOTTLE ONLY Blood Culture results may not be optimal due to an inadequate volume of blood received in culture bottles   CULT  05/03/2023 1305    NO GROWTH 5 DAYS Performed at [facility], [address] [place], [place] [address]    REPTSTATUS 05/08/2023 FINAL 05/03/2023 1305  Radiology Studies: No results found.   LOS: 18 days   Lanae Boast,  MD [facility]  05/18/2023, 12:11 PM

## 2023-05-18 NOTE — Plan of Care (Signed)
  Problem: Coping: Goal: Ability to adjust to condition or change in health will improve Outcome: Progressing   Problem: Fluid Volume: Goal: Ability to maintain a balanced intake and output will improve Outcome: Progressing   Problem: Health Behavior/Discharge Planning: Goal: Ability to identify and utilize available resources and services will improve Outcome: Progressing Goal: Ability to manage health-related needs will improve Outcome: Progressing   Problem: Metabolic: Goal: Ability to maintain appropriate glucose levels will improve Outcome: Progressing   Problem: Nutritional: Goal: Maintenance of adequate nutrition will improve Outcome: Progressing Goal: Progress toward achieving an optimal weight will improve Outcome: Progressing   Problem: Skin Integrity: Goal: Risk for impaired skin integrity will decrease Outcome: Progressing   Problem: Tissue Perfusion: Goal: Adequacy of tissue perfusion will improve Outcome: Progressing   Problem: Education: Goal: Knowledge of General Education information will improve Description: Including pain rating scale, medication(s)/side effects and non-pharmacologic comfort measures Outcome: Progressing   Problem: Health Behavior/Discharge Planning: Goal: Ability to manage health-related needs will improve Outcome: Progressing   Problem: Clinical Measurements: Goal: Ability to maintain clinical measurements within normal limits will improve Outcome: Progressing Goal: Will remain free from infection Outcome: Progressing Goal: Diagnostic test results will improve Outcome: Progressing Goal: Respiratory complications will improve Outcome: Progressing Goal: Cardiovascular complication will be avoided Outcome: Progressing   Problem: Activity: Goal: Risk for activity intolerance will decrease Outcome: Progressing   Problem: Nutrition: Goal: Adequate nutrition will be maintained Outcome: Progressing   Problem: Coping: Goal:  Level of anxiety will decrease Outcome: Progressing   Problem: Elimination: Goal: Will not experience complications related to bowel motility Outcome: Progressing Goal: Will not experience complications related to urinary retention Outcome: Progressing   Problem: Pain Managment: Goal: General experience of comfort will improve Outcome: Progressing   Problem: Safety: Goal: Ability to remain free from injury will improve Outcome: Progressing   Problem: Skin Integrity: Goal: Risk for impaired skin integrity will decrease Outcome: Progressing   Problem: Education: Goal: Knowledge of disease and its progression will improve Outcome: Progressing   Problem: Fluid Volume: Goal: Compliance with measures to maintain balanced fluid volume will improve Outcome: Progressing   Problem: Health Behavior/Discharge Planning: Goal: Ability to manage health-related needs will improve Outcome: Progressing   Problem: Nutritional: Goal: Ability to make healthy dietary choices will improve Outcome: Progressing   Problem: Clinical Measurements: Goal: Complications related to the disease process, condition or treatment will be avoided or minimized Outcome: Progressing

## 2023-05-18 NOTE — Progress Notes (Addendum)
Patient ID: [name], female   DOB: [birth year], 42 y.o.   MRN: [id number] [facility] Surgery Progress Note  14 Days Post-Op  Subjective: CC-  Patient tells me her name and then only reports "I am in pain" to all questions. RN at bedside. Reports that she ate a bacon sandwich yesterday. No vomiting reported. 750cc/24 hours out from ostomy per I/O but RN reports it was > 1000cc yesterday - will need strict I/O.   To note patient hypoxic overnight and now on o2 at 3L via [place]. She is also hypotensive to 79/54 > 84/58. Appears baseline BP has been 90-100/60's. Afebrile w/ Tmax 99. HR between 56 - 100.   Objective: Vital signs in last 24 hours: Temp:  [97.9 F (36.6 C)-98.7 F (37.1 C)] 98.6 F (37 C) (10/05 0405) Pulse Rate:  [88-100] 94 (10/05 0405) Resp:  [16-18] 18 (10/05 0405) BP: (79-153)/(47-80) 93/58 (10/05 0405) SpO2:  [61 %-100 %] 90 % (10/05 0405) Weight:  [48 kg-50 kg] 48 kg (10/05 0442) Last BM Date : 05/18/23  Intake/Output from previous day: 10/04 0701 - 10/05 0700 In: 354 [P.O.:354] Out: 749.5 [Stool:750] Intake/Output this shift: No intake/output data recorded.  PE: General: NAD Heart: Appears reg Lungs: CTA b/l, normal rate and effort Abd: She reports generalized abdominal pain with palpation but her abdomen is very soft on exam without rigidity or guarding. Appears ND. +BS. Ileostomy viable with non-bloody, non-melanous liquid stool. Midline wound as noted below with mixed granulation tissue and fibrinous tissue  Msk: B/l LE AKA without obvious edema    Lab Results:  Recent Labs    05/16/23 2011 05/17/23 0556  WBC 15.8* 14.2*  HGB 7.3* 7.9*  HCT 24.1* 26.8*  PLT 305 338   BMET Recent Labs    05/16/23 2011 05/17/23 0556  NA 135 135  K 4.7 4.6  CL 94* 91*  CO2 28 29  GLUCOSE 120* 90  BUN 15 19  CREATININE 4.34* 4.91*  CALCIUM 9.2 9.2   PT/INR No results for input(s): "LABPROT", "INR" in the last 72 hours.  CMP     Component Value  Date/Time   NA 135 05/17/2023 0556   NA 133 (L) 05/18/2020 0927   K 4.6 05/17/2023 0556   CL 91 (L) 05/17/2023 0556   CO2 29 05/17/2023 0556   GLUCOSE 90 05/17/2023 0556   BUN 19 05/17/2023 0556   BUN 45 (H) 05/18/2020 0927   CREATININE 4.91 (H) 05/17/2023 0556   CALCIUM 9.2 05/17/2023 0556   CALCIUM 12.1 (H) 11/13/2021 2026   PROT 6.2 (L) 05/01/2023 1020   PROT 7.6 05/18/2020 0927   ALBUMIN 1.9 (L) 05/15/2023 0844   ALBUMIN 3.9 05/18/2020 0927   AST 26 05/01/2023 1020   ALT 18 05/01/2023 1020   ALKPHOS 72 05/01/2023 1020   BILITOT 0.9 05/01/2023 1020   BILITOT <0.2 05/18/2020 0927   GFRNONAA 11 (L) 05/17/2023 0556   GFRAA 6 (L) 05/18/2020 0927   Lipase     Component Value Date/Time   LIPASE 40 12/20/2022 0918       Studies/Results: No results found.  Anti-infectives: Anti-infectives (From admission, onward)    Start     Dose/Rate Route Frequency Ordered Stop   05/08/23 2200  cefTRIAXone (ROCEPHIN) 2 g in sodium chloride 0.9 % 100 mL IVPB        2 g 200 mL/hr over 30 Minutes Intravenous Every 24 hours 05/08/23 1613 05/11/23 2232   05/08/23 2200  metroNIDAZOLE (FLAGYL) IVPB  500 mg        500 mg 100 mL/hr over 60 Minutes Intravenous Every 12 hours 05/08/23 1613 05/11/23 2346   05/04/23 0200  cefTRIAXone (ROCEPHIN) 2 g in sodium chloride 0.9 % 100 mL IVPB        2 g 200 mL/hr over 30 Minutes Intravenous Every 24 hours 05/04/23 0102 05/08/23 0144   05/04/23 0200  metroNIDAZOLE (FLAGYL) IVPB 500 mg        500 mg 100 mL/hr over 60 Minutes Intravenous Every 12 hours 05/04/23 0102 05/09/23 1203   04/30/23 1800  ciprofloxacin (CIPRO) IVPB 400 mg  Status:  Discontinued        400 mg 200 mL/hr over 60 Minutes Intravenous Every 24 hours 04/30/23 1443 05/02/23 1140   04/30/23 1515  metroNIDAZOLE (FLAGYL) IVPB 500 mg  Status:  Discontinued        500 mg 100 mL/hr over 60 Minutes Intravenous Every 12 hours 04/30/23 1418 05/02/23 1140        Assessment/Plan 42 yo  female presenting with abdominal pain, diarrhea, low grade fevers, dilated small bowel and colonic wall thickening on CT   POD#14 s/p diagnostic lap converted to ex-lap with extended ileo-cecectomy (mid-ileum to just past cecum), 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 Hillery Hunter - Intra-op findings were notable for adhesions in the RLQ w/ associated twisting of the mesentery resulting in SB ischemia. - CT 9/26 ordered due to leukocytosis but overall looks ok, some residual small bowel thickening but no large abscess. Off IV abx as of 9/28. - Dakin's wet to dry dressings  - Has had high volume ileostomy output, 700cc last 24hr on I/O but RN reports it was higher than this. Strict I/O. Continue BID iron, BID fiber, and TID imodium. Titrate as needed.  - mobilize, continue therapies - rec SNF - Tolerating diet but not taking in much at all. Trialing Marinol - Repeat labs today. With vital sign changes will get CT A/P on her today. Checking with nephrology if okay with IV contrast. Will also get CXR given hypoxia. Will discuss w/ TR[facility].    FEN: NPO for CT. If negative can go back on Reg diet. , Glucerna ID: Rocephin/Flagyl 9/21 > 9/28 VTE: SCDs, SQH q 8h Foley: out, RN checking     LOS: 18 days   Jacinto Halim, [facility]

## 2023-05-19 DIAGNOSIS — K56609 Unspecified intestinal obstruction, unspecified as to partial versus complete obstruction: Secondary | ICD-10-CM | POA: Diagnosis not present

## 2023-05-19 LAB — GLUCOSE, CAPILLARY
Glucose-Capillary: 246 mg/dL — ABNORMAL HIGH (ref 70–99)
Glucose-Capillary: 221 mg/dL — ABNORMAL HIGH (ref 70–99)
Glucose-Capillary: 216 mg/dL — ABNORMAL HIGH (ref 70–99)
Glucose-Capillary: 157 mg/dL — ABNORMAL HIGH (ref 70–99)

## 2023-05-19 LAB — CBC
HCT: 24.7 % — ABNORMAL LOW (ref 36.0–46.0)
Hemoglobin: 7.4 g/dL — ABNORMAL LOW (ref 12.0–15.0)
MCH: 25.9 pg — ABNORMAL LOW (ref 26.0–34.0)
MCHC: 30 g/dL (ref 30.0–36.0)
MCV: 86.4 fL (ref 80.0–100.0)
Platelets: 332 10*3/uL (ref 150–400)
RBC: 2.86 MIL/uL — ABNORMAL LOW (ref 3.87–5.11)
RDW: 19.4 % — ABNORMAL HIGH (ref 11.5–15.5)
WBC: 17.6 10*3/uL — ABNORMAL HIGH (ref 4.0–10.5)
nRBC: 0 % (ref 0.0–0.2)

## 2023-05-19 LAB — BASIC METABOLIC PANEL
Anion gap: 16 — ABNORMAL HIGH (ref 5–15)
BUN: 22 mg/dL — ABNORMAL HIGH (ref 6–20)
CO2: 23 mmol/L (ref 22–32)
Calcium: 9.3 mg/dL (ref 8.9–10.3)
Chloride: 90 mmol/L — ABNORMAL LOW (ref 98–111)
Creatinine, Ser: 5.15 mg/dL — ABNORMAL HIGH (ref 0.44–1.00)
GFR, Estimated: 10 mL/min — ABNORMAL LOW (ref >60)
Glucose, Bld: 247 mg/dL — ABNORMAL HIGH (ref 70–99)
Potassium: 4.9 mmol/L (ref 3.5–5.1)
Sodium: 129 mmol/L — ABNORMAL LOW (ref 135–145)

## 2023-05-19 MED ORDER — LOPERAMIDE HCL 2 MG PO CAPS
2.0000 mg | ORAL_CAPSULE | Freq: Four times a day (QID) | ORAL | Status: DC | PRN
  Administered 2023-05-20 – 2023-05-21 (×3): 2 mg via ORAL
  Filled 2023-05-19 (×4): qty 1

## 2023-05-19 MED ORDER — CHLORHEXIDINE GLUCONATE CLOTH 2 % EX PADS
6.0000 | MEDICATED_PAD | Freq: Every day | CUTANEOUS | Status: DC
  Administered 2023-05-20 – 2023-05-22 (×3): 6 via TOPICAL

## 2023-05-19 MED ORDER — DAKINS (1/4 STRENGTH) 0.125 % EX SOLN
Freq: Two times a day (BID) | CUTANEOUS | Status: DC
  Filled 2023-05-19: qty 473

## 2023-05-19 NOTE — Plan of Care (Signed)
  Problem: Nutritional: Goal: Maintenance of adequate nutrition will improve Outcome: Progressing   

## 2023-05-19 NOTE — Progress Notes (Signed)
Patient ID: [name], female   DOB: 30-Sep-1980, 42 y.o.   MRN: [id number] [facility] Surgery Progress Note  15 Days Post-Op  Subjective: CC-  Seen with RN. Denies any abdominal pain. Having ostomy output. BP better after getting started on midodrine.   Objective: Vital signs in last 24 hours: Temp:  [98.7 F (37.1 C)-99.3 F (37.4 C)] 99.3 F (37.4 C) (10/06 1021) Pulse Rate:  [87-91] 91 (10/06 0431) Resp:  [18-20] 18 (10/06 1021) BP: (90-104)/(52-63) 104/52 (10/06 1022) SpO2:  [98 %-100 %] 100 % (10/06 0431) Weight:  [50 kg] 50 kg (10/06 0711) Last BM Date : 05/18/23  Intake/Output from previous day: 10/05 0701 - 10/06 0700 In: 245.1 [P.O.:120; I.V.:125.1] Out: 500 [Stool:500] Intake/Output this shift: Total I/O In: 120 [P.O.:120] Out: -   PE: General: NAD Lungs: Normal rate and effort Abd: Abdomen is very soft on exam without rigidity or guarding. Appears ND. +BS. Ileostomy unable to be visualized 2/2 stool. Liquid non-bloody, non-melanous stool in ostomy bag. Midline wound stable from yesterday w/ mixed granulation tissue and fibrinous tissue  Msk: B/l LE AKA    Lab Results:  Recent Labs    05/18/23 1800 05/19/23 0334  WBC 20.5* 17.6*  HGB 7.8* 7.4*  HCT 26.1* 24.7*  PLT 342 332   BMET Recent Labs    05/18/23 1800 05/19/23 0334  NA 128* 129*  K 5.2* 4.9  CL 89* 90*  CO2 19* 23  GLUCOSE 191* 247*  BUN 20 22*  CREATININE 4.81* 5.15*  CALCIUM 9.0 9.3   PT/INR No results for input(s): "LABPROT", "INR" in the last 72 hours.  CMP     Component Value Date/Time   NA 129 (L) 05/19/2023 0334   NA 133 (L) 05/18/2020 0927   K 4.9 05/19/2023 0334   CL 90 (L) 05/19/2023 0334   CO2 23 05/19/2023 0334   GLUCOSE 247 (H) 05/19/2023 0334   BUN 22 (H) 05/19/2023 0334   BUN 45 (H) 05/18/2020 0927   CREATININE 5.15 (H) 05/19/2023 0334   CALCIUM 9.3 05/19/2023 0334   CALCIUM 12.1 (H) 11/13/2021 2026   PROT 6.2 (L) 05/01/2023 1020   PROT 7.6  05/18/2020 0927   ALBUMIN 1.9 (L) 05/15/2023 0844   ALBUMIN 3.9 05/18/2020 0927   AST 26 05/01/2023 1020   ALT 18 05/01/2023 1020   ALKPHOS 72 05/01/2023 1020   BILITOT 0.9 05/01/2023 1020   BILITOT <0.2 05/18/2020 0927   GFRNONAA 10 (L) 05/19/2023 0334   GFRAA 6 (L) 05/18/2020 0927   Lipase     Component Value Date/Time   LIPASE 40 12/20/2022 0918       Studies/Results: CT ABDOMEN PELVIS W CONTRAST  Result Date: 05/18/2023 CLINICAL DATA:  Postop abdominal pain. EXAM: CT ABDOMEN AND PELVIS WITH CONTRAST TECHNIQUE: Multidetector CT imaging of the abdomen and pelvis was performed using the standard protocol following bolus administration of intravenous contrast. RADIATION DOSE REDUCTION: This exam was performed according to the departmental dose-optimization program which includes automated exposure control, adjustment of the mA and/or kV according to patient size and/or use of iterative reconstruction technique. CONTRAST:  75mL OMNIPAQUE IOHEXOL 350 MG/ML SOLN COMPARISON:  05/09/2023. FINDINGS: Lower chest: Lung bases clear. No pleural or pericardial effusion. There are pacer wires. Hepatobiliary: Gallbladder is contracted and contains hyperdense contents that could be calcific stones or related to excreted contrast from prior study. Pancreas: Unremarkable. No pancreatic ductal dilatation or surrounding inflammatory changes. Spleen: Medial hypoattenuation similar to the prior study appears  to be a perfusion anomaly. This is stable. No splenomegaly. Adrenals/Urinary Tract: Extensive vascular calcifications of both kidneys with no focal lesions or hydronephrosis identified. No adrenal lesions. Urinary bladder is empty and not well evaluated. Stomach/Bowel: Right lower quadrant ostomy. Diffuse colon wall thickening consistent with colitis. Mild dilatation of small bowel in the lower right abdomen. Vascular/Lymphatic: Marked extensive diffuse atheromatous disease of the aorta and other arterial  structures. No evidence of aneurysm. Reproductive: Uterine cavity contains fluid. No definite adnexal pathology, but the evaluation is limited. Other: Deep wide anterior abdominal wall open defect. Musculoskeletal: No acute or significant osseous findings. IMPRESSION: 1. Large anterior abdominal wall open defect. 2. Diffuse colon wall thickening consistent with colitis. 3. 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 4. Gallbladder contracted containing stones or excreted contrast. 5. Marked atheromatous disease. 6. Mild small-bowel dilatation right lower abdomen proximal to right lower quadrant ileostomy. 7. Nonspecific fluid attenuation in the uterine cavity. Electronically Signed   By: Layla Maw M.D.   On: 05/18/2023 18:59   DG CHEST PORT 1 VIEW  Result Date: 05/18/2023 CLINICAL DATA:  Hypoxia EXAM: PORTABLE CHEST 1 VIEW COMPARISON:  04/30/2023 FINDINGS: Dialysis catheter from a left-sided approach terminates at the mid to low right atrium. Patient rotated minimally left. Midline trachea. Normal heart size. No pleural effusion or pneumothorax. Clear lungs. IMPRESSION: No active disease. Electronically Signed   By: Jeronimo Greaves M.D.   On: 05/18/2023 14:30    Anti-infectives: Anti-infectives (From admission, onward)    Start     Dose/Rate Route Frequency Ordered Stop   05/08/23 2200  cefTRIAXone (ROCEPHIN) 2 g in sodium chloride 0.9 % 100 mL IVPB        2 g 200 mL/hr over 30 Minutes Intravenous Every 24 hours 05/08/23 1613 05/11/23 2232   05/08/23 2200  metroNIDAZOLE (FLAGYL) IVPB 500 mg        500 mg 100 mL/hr over 60 Minutes Intravenous Every 12 hours 05/08/23 1613 05/11/23 2346   05/04/23 0200  cefTRIAXone (ROCEPHIN) 2 g in sodium chloride 0.9 % 100 mL IVPB        2 g 200 mL/hr over 30 Minutes Intravenous Every 24 hours 05/04/23 0102 05/08/23 0144   05/04/23 0200  metroNIDAZOLE (FLAGYL) IVPB 500 mg        500 mg 100 mL/hr over 60 Minutes Intravenous Every 12 hours  05/04/23 0102 05/09/23 1203   04/30/23 1800  ciprofloxacin (CIPRO) IVPB 400 mg  Status:  Discontinued        400 mg 200 mL/hr over 60 Minutes Intravenous Every 24 hours 04/30/23 1443 05/02/23 1140   04/30/23 1515  metroNIDAZOLE (FLAGYL) IVPB 500 mg  Status:  Discontinued        500 mg 100 mL/hr over 60 Minutes Intravenous Every 12 hours 04/30/23 1418 05/02/23 1140        Assessment/Plan 42 yo female presenting with abdominal pain, diarrhea, low grade fevers, dilated small bowel and colonic wall thickening on CT   POD#15 s/p diagnostic lap converted to ex-lap with extended ileo-cecectomy (mid-ileum to just past cecum), 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 Hillery Hunter - Intra-op findings were notable for adhesions in the RLQ w/ associated twisting of the mesentery resulting in SB ischemia. - CT 9/26 ordered due to leukocytosis but overall looks ok, some residual small bowel thickening but no large abscess. Off IV abx as of 9/28. - CT 10/5 for hypotension also overall looks okay. Some thickening of the colon wall and mild  small-bowel dilatation. HDS improved. WBC downtrending.  - Dakin's wet to dry dressings  - High ileostomy output improved with 500cc last 24hr on I/O. Strict I/O. Continue BID iron, BID fiber, and TID imodium. Titrate as needed.  - mobilize, continue therapies - rec SNF - Restart diet. On Marinol   FEN:  Renal diet, Glucerna ID: Rocephin/Flagyl 9/21 > 9/28 VTE: SCDs, SQH q 8h Foley: out    LOS: 19 days   Jacinto Halim, [facility] Surgery

## 2023-05-19 NOTE — Progress Notes (Signed)
PROGRESS NOTE  [name]  ZOX:[id number] DOB: [birth year] DOA: 04/30/2023 PCP: [facility], [facility]   Brief Narrative: Patient is a 42 year old female with history of type 1 diabetes with diabetic retinopathy, peripheral vascular disease status post bilateral BKA, ESRD on dialysis, hypertension, hyperlipidemia, CAD, mesenteric ischemia with chronic pneumatosis who initially presented from [facility] with complaint of acute onset abdominal pain, diarrhea.  On presentation, she was febrile, tachycardic.  CT abdomen/pelvis showed small bowel obstruction with dilated ileal segment up to 4 cm, sigmoid colitis, diffuse gastric wall thickening, cholelithiasis.  General surgery was consulted and was admitted for the management of small bowel obstruction.  Nephrology consulted for dialysis.  General surgery did  diagnostic laparoscopy which was converted to ex lap with extended ileocecectomy and end ileostomy on 05/04/2023.S/P IR fistulogram and balloon angioplasty 05/14/23.  Prolonged hospitalization.  Assessment & Plan:  Principal Problem:   SBO (small bowel obstruction) (HCC) Active Problems:   Ischemic disease of gut (HCC)   Protein-calorie malnutrition, moderate (HCC)  Partial SBO: Presented with abdominal pain, diarrhea.  Found to have elevated lactic acid on presentation.  CT abdomen/pelvis showed small bowel obstruction, colitis.  Did not respond to conservative management, IV antibiotics.  General surgery diagnostic laparoscopy which was converted to ex lap with extended ileocecectomy and end ileostomy on 05/04/2023.  Antibiotics discontinued and wound VAC removed on 9/28. General surgery following. CT abd/pelvis on 10/ 5 showed diffuse colon wall thickening consistent with colitis,marked atheromatous disease,mild small-bowel dilatation right lower abdomen proximal to right lower quadrant ileostomy.  She is still n.p.o. today.  Will wait for general surgery to comment on  diet.  Patient did not let me examine the abdomen today  ESRD on dialysis: Nephrology following.  AV fistula found to be clotted.status post temporary dialysis catheter placement.  IR did fistulogram and balloon angioplasty of cephalic vein and central vein on 05/14/2023  Metabolic bone disease of CKD: Continue sevelamer, Sensipar  Anemia of chronic disease: Hemoglobin in the range of 7.  Continue to monitor.  Associated with CKD.  Hypertension: Previously taking isosorbide mononitrate, which had been discontinued due to hypotension.  Started on midodrine  Peripheral vascular disease/hyperlipidemia: Status post bilateral BKA  Leukocytosis: Has mild leukocytosis.  She is off antibiotics for now.  Continue to monitor  Goals of care: Palliative care following for goals of care.  Current  recommendation is to continue full scope of treatment.  We may need to [facility] palliative care at some point  Pressure Injury 04/30/23 Sacrum Mid Stage 2 -  Partial thickness loss of dermis presenting as a shallow open injury with a red, pink wound bed without slough. 5 cm x 7 cm pink dry (Active)  04/30/23   Location: Sacrum  Location Orientation: Mid  Staging: Stage 2 -  Partial thickness loss of dermis presenting as a shallow open injury with a red, pink wound bed without slough.  Wound Description (Comments): 5 cm x 7 cm pink dry  Present on Admission: Yes          Nutrition Problem: Moderate Malnutrition Etiology: chronic illness Pressure Injury 04/30/23 Sacrum Mid Stage 2 -  Partial thickness loss of dermis presenting as a shallow open injury with a red, pink wound bed without slough. 5 cm x 7 cm pink dry (Active)  04/30/23   Location: Sacrum  Location Orientation: Mid  Staging: Stage 2 -  Partial thickness loss of dermis presenting as a shallow open injury with a red,  pink wound bed without slough.  Wound Description (Comments): 5 cm x 7 cm pink dry  Present on Admission: Yes  Dressing  Type Foam - Lift dressing to assess site every shift 05/18/23 1915    DVT prophylaxis:heparin injection 5,000 Units Start: 04/30/23 0900     Code Status: Full Code  Family Communication: None at bedside  Patient status:Inpatient  Patient is from :SNF  Anticipated discharge to:SNF  Estimated DC date:not sure   Consultants: General Surgery, vascular surgery, nephrology  Procedures: As above  Antimicrobials:  Anti-infectives (From admission, onward)    Start     Dose/Rate Route Frequency Ordered Stop   05/08/23 2200  cefTRIAXone (ROCEPHIN) 2 g in sodium chloride 0.9 % 100 mL IVPB        2 g 200 mL/hr over 30 Minutes Intravenous Every 24 hours 05/08/23 1613 05/11/23 2232   05/08/23 2200  metroNIDAZOLE (FLAGYL) IVPB 500 mg        500 mg 100 mL/hr over 60 Minutes Intravenous Every 12 hours 05/08/23 1613 05/11/23 2346   05/04/23 0200  cefTRIAXone (ROCEPHIN) 2 g in sodium chloride 0.9 % 100 mL IVPB        2 g 200 mL/hr over 30 Minutes Intravenous Every 24 hours 05/04/23 0102 05/08/23 0144   05/04/23 0200  metroNIDAZOLE (FLAGYL) IVPB 500 mg        500 mg 100 mL/hr over 60 Minutes Intravenous Every 12 hours 05/04/23 0102 05/09/23 1203   04/30/23 1800  ciprofloxacin (CIPRO) IVPB 400 mg  Status:  Discontinued        400 mg 200 mL/hr over 60 Minutes Intravenous Every 24 hours 04/30/23 1443 05/02/23 1140   04/30/23 1515  metroNIDAZOLE (FLAGYL) IVPB 500 mg  Status:  Discontinued        500 mg 100 mL/hr over 60 Minutes Intravenous Every 12 hours 04/30/23 1418 05/02/23 1140       Subjective: Patient seen at bedside.  She was hemodynamically stable.  Her eyes were closed and she was lying in bed.  Overall appears comfortable.  When tried to talk to her, she said" I want to go" ," I want to go".  When asked what she is talking about, she did not answer.  She did not let examined me.  Objective: Vitals:   05/18/23 1116 05/18/23 1411 05/18/23 2200 05/19/23 0431  BP: (!) 101/57  104/63 (!) 90/58 (!) 93/55  Pulse: 100  87 91  Resp: 17 18 20 19   Temp: 100.3 F (37.9 C) 98.7 F (37.1 C) 98.9 F (37.2 C)   TempSrc: Oral Oral Axillary   SpO2: 99% 98% 100% 100%  Weight:        Intake/Output Summary (Last 24 hours) at 05/19/2023 0751 Last data filed at 05/18/2023 1700 Gross per 24 hour  Intake 245.12 ml  Output 500 ml  Net -254.88 ml   Filed Weights   05/17/23 1527 05/17/23 1735 05/18/23 0442  Weight: 50 kg 50 kg 48 kg    Examination:  General exam: Overall comfortable, not in distress, deconditioned, lying on bed, bilateral BKA, abdominal wound covered with dressing, temporary dialysis catheter on the left neck  Patient did not allow full examination   Data Reviewed: I have personally reviewed following labs and imaging studies  CBC: Recent Labs  Lab 05/15/23 0844 05/16/23 2011 05/17/23 0556 05/18/23 1800 05/19/23 0334  WBC 16.5* 15.8* 14.2* 20.5* 17.6*  HGB 7.3* 7.3* 7.9* 7.8* 7.4*  HCT 23.4* 24.1* 26.8* 26.1* 24.7*  MCV 85.7 85.2 87.3 87.6 86.4  PLT 367 305 338 342 332   Basic Metabolic Panel: Recent Labs  Lab 05/13/23 0807 05/14/23 2228 05/15/23 0844 05/16/23 2011 05/17/23 0556 05/18/23 1800 05/19/23 0334  NA 137 134* 133* 135 135 128* 129*  K 4.1 4.4 4.4 4.7 4.6 5.2* 4.9  CL 100 93* 92* 94* 91* 89* 90*  CO2 23 29 26 28 29  19* 23  GLUCOSE 173* 109* 76 120* 90 191* 247*  BUN 28* 17 20 15 19 20  22*  CREATININE 6.02* 4.37* 4.76* 4.34* 4.91* 4.81* 5.15*  CALCIUM 8.4* 9.0 8.8* 9.2 9.2 9.0 9.3  PHOS 5.7* 4.9* 5.1*  --   --   --   --      No results found for this or any previous visit (from the past 240 hour(s)).   Radiology Studies: CT ABDOMEN PELVIS W CONTRAST  Result Date: 05/18/2023 CLINICAL DATA:  Postop abdominal pain. EXAM: CT ABDOMEN AND PELVIS WITH CONTRAST TECHNIQUE: Multidetector CT imaging of the abdomen and pelvis was performed using the standard protocol following bolus administration of intravenous contrast.  RADIATION DOSE REDUCTION: This exam was performed according to the departmental dose-optimization program which includes automated exposure control, adjustment of the mA and/or kV according to patient size and/or use of iterative reconstruction technique. CONTRAST:  75mL OMNIPAQUE IOHEXOL 350 MG/ML SOLN COMPARISON:  05/09/2023. FINDINGS: Lower chest: Lung bases clear. No pleural or pericardial effusion. There are pacer wires. Hepatobiliary: Gallbladder is contracted and contains hyperdense contents that could be calcific stones or related to excreted contrast from prior study. Pancreas: Unremarkable. No pancreatic ductal dilatation or surrounding inflammatory changes. Spleen: Medial hypoattenuation similar to the prior study appears to be a perfusion anomaly. This is stable. No splenomegaly. Adrenals/Urinary Tract: Extensive vascular calcifications of both kidneys with no focal lesions or hydronephrosis identified. No adrenal lesions. Urinary bladder is empty and not well evaluated. Stomach/Bowel: Right lower quadrant ostomy. Diffuse colon wall thickening consistent with colitis. Mild dilatation of small bowel in the lower right abdomen. Vascular/Lymphatic: Marked extensive diffuse atheromatous disease of the aorta and other arterial structures. No evidence of aneurysm. Reproductive: Uterine cavity contains fluid. No definite adnexal pathology, but the evaluation is limited. Other: Deep wide anterior abdominal wall open defect. Musculoskeletal: No acute or significant osseous findings. IMPRESSION: 1. Large anterior abdominal wall open defect. 2. Diffuse colon wall thickening consistent with colitis. 3. 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 4. Gallbladder contracted containing stones or excreted contrast. 5. Marked atheromatous disease. 6. Mild small-bowel dilatation right lower abdomen proximal to right lower quadrant ileostomy. 7. Nonspecific fluid attenuation in the uterine cavity.  Electronically Signed   By: Layla Maw M.D.   On: 05/18/2023 18:59   DG CHEST PORT 1 VIEW  Result Date: 05/18/2023 CLINICAL DATA:  Hypoxia EXAM: PORTABLE CHEST 1 VIEW COMPARISON:  04/30/2023 FINDINGS: Dialysis catheter from a left-sided approach terminates at the mid to low right atrium. Patient rotated minimally left. Midline trachea. Normal heart size. No pleural effusion or pneumothorax. Clear lungs. IMPRESSION: No active disease. Electronically Signed   By: Jeronimo Greaves M.D.   On: 05/18/2023 14:30    Scheduled Meds:  Chlorhexidine Gluconate Cloth  6 each Topical Q0600   Chlorhexidine Gluconate Cloth  6 each Topical Q0600   darbepoetin (ARANESP) injection - DIALYSIS  60 mcg Subcutaneous Q Fri-1800   doxercalciferol  4 mcg Intravenous Q M,W,F-HD   dronabinol  5 mg Oral BID AC   feeding  supplement  237 mL Oral TID BM   ferrous sulfate  325 mg Oral BID WC   heparin  5,000 Units Subcutaneous Q8H   insulin aspart  0-6 Units Subcutaneous TID WC   loperamide  2 mg Oral TID   midodrine  10 mg Oral TID WC   pantoprazole  40 mg Oral QHS   polycarbophil  1,250 mg Oral BID   sevelamer carbonate  0.8 g Oral TID WC   Continuous Infusions:   LOS: 19 days   Burnadette Pop, MD [facility] P10/01/2023, 7:51 AM

## 2023-05-19 NOTE — Progress Notes (Signed)
[facility] Progress Note   Subjective:   BP still soft this AM but a little bit better. She is resting comfortably with eyes closed, shakes head no when asked if she has any SOB or CP.   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   Liver Function Tests: Recent Labs  Lab 05/13/23 0807 05/14/23 2228 05/15/23 0844  ALBUMIN 1.7* 2.0* 1.9*   No results for input(s): "LIPASE", "AMYLASE" in the last 168 hours. CBC: Recent Labs  Lab 05/15/23 0844 05/16/23 2011 05/17/23 0556 05/18/23 1800 05/19/23 0334  WBC 16.5* 15.8* 14.2* 20.5* 17.6*  HGB 7.3* 7.3* 7.9* 7.8* 7.4*  HCT 23.4* 24.1* 26.8* 26.1* 24.7*  MCV 85.7 85.2 87.3 87.6 86.4  PLT 367 305 338 342 332   Blood Culture    Component Value Date/Time   SDES BLOOD LEFT HAND 05/03/2023 1305   SPECREQUEST  05/03/2023 1305    AEROBIC BOTTLE ONLY Blood Culture results may not  be optimal due to an inadequate volume of blood received in culture bottles   CULT  05/03/2023 1305    NO GROWTH 5 DAYS Performed at [facility], [address], [place], [place] [address]    REPTSTATUS 05/08/2023 FINAL 05/03/2023 1305    Cardiac Enzymes: No results for input(s): "CKTOTAL", "CKMB", "CKMBINDEX", "TROPONINI" in the last 168 hours. CBG: Recent Labs  Lab 05/18/23 0920 05/18/23 1233 05/18/23 1638 05/18/23 2117 05/19/23 0557  GLUCAP 82 132* 188* 192* 246*   Iron Studies: No results for input(s): "IRON", "TIBC", "TRANSFERRIN", "FERRITIN" in the last 72 hours. @lablastinr3 @ Studies/Results: CT ABDOMEN PELVIS W CONTRAST  Result Date: 05/18/2023 CLINICAL DATA:  Postop abdominal pain. EXAM: CT ABDOMEN AND PELVIS WITH CONTRAST TECHNIQUE: Multidetector CT imaging of the abdomen and pelvis was performed using the standard protocol following bolus administration of intravenous contrast. RADIATION DOSE REDUCTION: This exam was performed according to the departmental dose-optimization program which includes automated exposure control, adjustment of the mA and/or kV according to patient size and/or use of iterative reconstruction technique. CONTRAST:  75mL OMNIPAQUE IOHEXOL  350 MG/ML SOLN COMPARISON:  05/09/2023. FINDINGS: Lower chest: Lung bases clear. No pleural or pericardial effusion. There are pacer wires. Hepatobiliary: Gallbladder is contracted and contains hyperdense contents that could be calcific stones or related to excreted contrast from prior study. Pancreas: Unremarkable. No pancreatic ductal dilatation or surrounding inflammatory changes. Spleen: Medial hypoattenuation similar to the prior study appears to be a perfusion anomaly. This is stable. No splenomegaly. Adrenals/Urinary Tract: Extensive vascular calcifications of both kidneys with no focal lesions or hydronephrosis identified. No adrenal lesions. Urinary bladder is empty and not well evaluated. Stomach/Bowel:  Right lower quadrant ostomy. Diffuse colon wall thickening consistent with colitis. Mild dilatation of small bowel in the lower right abdomen. Vascular/Lymphatic: Marked extensive diffuse atheromatous disease of the aorta and other arterial structures. No evidence of aneurysm. Reproductive: Uterine cavity contains fluid. No definite adnexal pathology, but the evaluation is limited. Other: Deep wide anterior abdominal wall open defect. Musculoskeletal: No acute or significant osseous findings. IMPRESSION: 1. Large anterior abdominal wall open defect. 2. Diffuse colon wall thickening consistent with colitis. 3. 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 4. Gallbladder contracted containing stones or excreted contrast. 5. Marked atheromatous disease. 6. Mild small-bowel dilatation right lower abdomen proximal to right lower quadrant ileostomy. 7. Nonspecific fluid attenuation in the uterine cavity. Electronically Signed   By: Layla Maw M.D.   On: 05/18/2023 18:59   DG CHEST PORT 1 VIEW  Result Date: 05/18/2023 CLINICAL DATA:  Hypoxia EXAM: PORTABLE CHEST 1 VIEW COMPARISON:  04/30/2023 FINDINGS: Dialysis catheter from a left-sided approach terminates at the mid to low right atrium. Patient rotated minimally left. Midline trachea. Normal heart size. No pleural effusion or pneumothorax. Clear lungs. IMPRESSION: No active disease. Electronically Signed   By: Jeronimo Greaves M.D.   On: 05/18/2023 14:30   Medications:   Chlorhexidine Gluconate Cloth  6 each Topical Q0600   Chlorhexidine Gluconate Cloth  6 each Topical Q0600   darbepoetin (ARANESP) injection - DIALYSIS  60 mcg Subcutaneous Q Fri-1800   doxercalciferol  4 mcg Intravenous Q M,W,F-HD   dronabinol  5 mg Oral BID AC   feeding supplement  237 mL Oral TID BM   ferrous sulfate  325 mg Oral BID WC   heparin  5,000 Units Subcutaneous Q8H   insulin aspart  0-6 Units Subcutaneous TID WC   loperamide  2 mg Oral TID   midodrine   10 mg Oral TID WC   pantoprazole  40 mg Oral QHS   polycarbophil  1,250 mg Oral BID   sevelamer carbonate  0.8 g Oral TID WC    Outpatient Dialysis Orders: [facility] MWF  3h  400/1.5  49kg   R AVF  Hep none - last OP HD 9/16, post wt 51kg, coming off 2- 4kg over the last 3 wks - mircera IV q 2 wks, last 9/13, due 9/27 - hectorol 4 mcg IV three times per week.   Assessment/Plan:  Abdominal pain - partial SBO s/p ex-lap with extended ileo-cecectomy with end ileostomy. Per primary/Gen surgery.    #Sepsis 2/2 to ischemic Colitis - completed IV abx on 9/28. Per pmd.    # ESRD - on HD MWF. Continue MWF schedule. No UF with HD, see below   # HD access - we were unable to cannulate AV fistula on 9/27 because of pulling clots.  IR placed temporary HD catheter and pt received dialysis on 9/27.  Duplex US of AVF showed low blood  flow, vascular surgeon and IR were consulted.  Pt went for fistulogram 10/1 by IR. There was a R cephalic stenosis treated w/ venoplasty, and a central stenosis also rx'd w/ venoplasty. 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 This area was marked and we are not to stick within the circular marking. The rest of the AVF is good to stick; did well w/ HD on 10/02.    # HTN/Volume - no vol excess on exam. She has had significant GI losses while here. Did not tolerate fluid removal earlier this week, no UF with HD Friday. BP remains soft. Given of saline yesterday with some improvement. Now on midodrine as well.    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 continue ESA.     # CKD-MBD - Continue sevelamer for hyperphosphatemia.  Continue hectorol. Sensipar on hold due to GI issues as above.  Rogers Blocker, PA-C 05/19/2023, 9:58 AM   [facility] Pager: [phone]

## 2023-05-20 DIAGNOSIS — K56609 Unspecified intestinal obstruction, unspecified as to partial versus complete obstruction: Secondary | ICD-10-CM | POA: Diagnosis not present

## 2023-05-20 LAB — IRON AND TIBC
Iron: 68 ug/dL (ref 28–170)
Saturation Ratios: 62 % — ABNORMAL HIGH (ref 10.4–31.8)
TIBC: 111 ug/dL — ABNORMAL LOW (ref 250–450)
UIBC: 43 ug/dL

## 2023-05-20 LAB — GLUCOSE, CAPILLARY
Glucose-Capillary: 141 mg/dL — ABNORMAL HIGH (ref 70–99)
Glucose-Capillary: 91 mg/dL (ref 70–99)

## 2023-05-20 MED ORDER — LANTHANUM CARBONATE 500 MG PO CHEW
1000.0000 mg | CHEWABLE_TABLET | Freq: Three times a day (TID) | ORAL | Status: DC
  Administered 2023-05-20 – 2023-05-24 (×7): 1000 mg via ORAL
  Filled 2023-05-20 (×14): qty 2

## 2023-05-20 MED ORDER — DAKINS (1/4 STRENGTH) 0.125 % EX SOLN
Freq: Two times a day (BID) | CUTANEOUS | Status: AC
  Administered 2023-05-21: 1
  Filled 2023-05-20: qty 473

## 2023-05-20 NOTE — Progress Notes (Signed)
[facility] Progress Note   Subjective:   Patient seen and examined at bedside.  Denies HA, CP, SOB and n/v.  Irritated, asked why we all have to mess with her. Says "please dont hurt me" when I ask if I can lift her gown to look at her stomach.   Objective Vitals:   05/20/23 0801 05/20/23 1142 05/20/23 1146 05/20/23 1159  BP: 104/62  95/70 96/65  Pulse: 74  81 81  Resp: 14 13 13 14   Temp: 98 F (36.7 C) 97.7 F (36.5 C) 97.7 F (36.5 C)   TempSrc: Oral Oral Oral   SpO2:  100% 98% 96%  Weight:  49 kg     Physical Exam General:chronically ill appearing, irritated female in NAD Heart:RRR, no mrg Lungs:CTAB anterolaterally, nml WOB on RA  Abdomen:soft, ND, midline wound, ileostomy in place Extremities:b/l AKA, no edema Dialysis Access: RU AVF +b/t   Filed Weights   05/19/23 0711 05/20/23 0500 05/20/23 1142  Weight: 50 kg 49 kg 49 kg    Intake/Output Summary (Last 24 hours) at 05/20/2023 1224 Last data filed at 05/19/2023 1600 Gross per 24 hour  Intake 360 ml  Output --  Net 360 ml    Additional Objective Labs: Basic Metabolic Panel: Recent Labs  Lab 05/14/23 2228 05/15/23 0844 05/16/23 2011 05/17/23 0556 05/18/23 1800 05/19/23 0334  NA 134* 133*   < > 135 128* 129*  K 4.4 4.4   < > 4.6 5.2* 4.9  CL 93* 92*   < > 91* 89* 90*  CO2 29 26   < > 29 19* 23  GLUCOSE 109* 76   < > 90 191* 247*  BUN 17 20   < > 19 20 22*  CREATININE 4.37* 4.76*   < > 4.91* 4.81* 5.15*  CALCIUM 9.0 8.8*   < > 9.2 9.0 9.3  PHOS 4.9* 5.1*  --   --   --   --    < > = values in this interval not displayed.   Liver Function Tests: Recent Labs  Lab 05/14/23 2228 05/15/23 0844  ALBUMIN 2.0* 1.9*    CBC: Recent Labs  Lab 05/15/23 0844 05/16/23 2011 05/17/23 0556 05/18/23 1800 05/19/23 0334  WBC 16.5* 15.8* 14.2* 20.5* 17.6*  HGB 7.3* 7.3* 7.9* 7.8* 7.4*  HCT 23.4* 24.1* 26.8* 26.1* 24.7*  MCV 85.7 85.2 87.3 87.6 86.4  PLT 367 305 338 342 332     Studies/Results: CT ABDOMEN PELVIS W CONTRAST  Result Date: 05/18/2023 CLINICAL DATA:  Postop abdominal pain. EXAM: CT ABDOMEN AND PELVIS WITH CONTRAST TECHNIQUE: Multidetector CT imaging of the abdomen and pelvis was performed using the standard protocol following bolus administration of intravenous contrast. RADIATION DOSE REDUCTION: This exam was performed according to the departmental dose-optimization program which includes automated exposure control, adjustment of the mA and/or kV according to patient size and/or use of iterative reconstruction technique. CONTRAST:  75mL OMNIPAQUE IOHEXOL 350 MG/ML SOLN COMPARISON:  05/09/2023. FINDINGS: Lower chest: Lung bases clear. No pleural or pericardial effusion. There are pacer wires. Hepatobiliary: Gallbladder is contracted and contains hyperdense contents that could be calcific stones or related to excreted contrast from prior study. Pancreas: Unremarkable. No pancreatic ductal dilatation or surrounding inflammatory changes. Spleen: Medial hypoattenuation similar to the prior study appears to be a perfusion anomaly. This is stable. No splenomegaly. Adrenals/Urinary Tract: Extensive vascular calcifications of both kidneys with no focal lesions or hydronephrosis identified. No adrenal lesions. Urinary bladder is empty and not  well evaluated. Stomach/Bowel: Right lower quadrant ostomy. Diffuse colon wall thickening consistent with colitis. Mild dilatation of small bowel in the lower right abdomen. Vascular/Lymphatic: Marked extensive diffuse atheromatous disease of the aorta and other arterial structures. No evidence of aneurysm. Reproductive: Uterine cavity contains fluid. No definite adnexal pathology, but the evaluation is limited. Other: Deep wide anterior abdominal wall open defect. Musculoskeletal: No acute or significant osseous findings. IMPRESSION: 1. Large anterior abdominal wall open defect. 2. Diffuse colon wall thickening consistent with colitis.  3. 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 4. Gallbladder contracted containing stones or excreted contrast. 5. Marked atheromatous disease. 6. Mild small-bowel dilatation right lower abdomen proximal to right lower quadrant ileostomy. 7. Nonspecific fluid attenuation in the uterine cavity. Electronically Signed   By: Layla Maw M.D.   On: 05/18/2023 18:59    Medications:   Chlorhexidine Gluconate Cloth  6 each Topical Q0600   darbepoetin (ARANESP) injection - DIALYSIS  60 mcg Subcutaneous Q Fri-1800   doxercalciferol  4 mcg Intravenous Q M,W,F-HD   dronabinol  5 mg Oral BID AC   feeding supplement  237 mL Oral TID BM   ferrous sulfate  325 mg Oral BID WC   heparin  5,000 Units Subcutaneous Q8H   insulin aspart  0-6 Units Subcutaneous TID WC   midodrine  10 mg Oral TID WC   pantoprazole  40 mg Oral QHS   polycarbophil  1,250 mg Oral BID   sevelamer carbonate  0.8 g Oral TID WC   sodium hypochlorite   Irrigation BID    Dialysis Orders: [facility] MWF  3h  400/1.5  49kg   R AVF  Hep none - last OP HD 9/16, post wt 51kg, coming off 2- 4kg over the last 3 wks - mircera IV q 2 wks, last 9/13, due 9/27 - hectorol 4 mcg IV three times per week.   Assessment/Plan: 1. 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 Hillery Hunter on 05/04/23. Surgery following 2. Sepsis 2/2 ischemic colitis - completed IV ABX on 9/28. Per PMD.  3. ESRD - on MWF.  Continue HD per regular schedule. No UF with HD.  4. HD access - Unable to cannulate AV fistula on 9/27 because of pulling clots.  IR placed temporary HD catheter and pt received dialysis on 9/27.  Duplex US of AVF showed low blood flow, vascular surgeon and IR were consulted.  Pt went for fistulogram 10/1 by IR. There was a R cephalic stenosis treated w/ venoplasty, and a central stenosis also rx'd w/ venoplasty. 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 This area was marked and we are not to stick within the circular marking. The rest of the AVF is good to stick; did well w/ HD on 10/02.  5. Anemia of CKD- Hgb 7.4. Continue ESA. Check iron panel. Transfuse prn for Hgb<7. 6. Secondary hyperparathyroidism - Ca at goal.  Senispar on hold d/t GI issues.  Continue VDRA.  Phos 5.1, change binders from sevelamer d/t SBO. Will start Fosrenol.  7. HTN/volume - BP soft, on midodrine.  Does not appear volume overloaded. Significant GI losses during admission.  Did not tolerate volume removal last week.  Plan to run even for now.  8. Nutrition - Renal diet w/fluid restrictions.   Virgina Norfolk, PA-C [facility] 05/20/2023,12:24 PM  LOS: 20 days

## 2023-05-20 NOTE — Progress Notes (Signed)
PROGRESS NOTE  [name]  JOA:[id number] DOB: [birth year] DOA: 04/30/2023 PCP: [facility]   Brief Narrative: Patient is a 42 year old female with history of type 1 diabetes with diabetic retinopathy/blindness, peripheral vascular disease status post bilateral BKA, ESRD on dialysis, hypertension, hyperlipidemia, CAD, mesenteric ischemia with chronic pneumatosis who initially presented from [facility] with complaint of acute onset abdominal pain, diarrhea.  On presentation, she was febrile, tachycardic.  CT abdomen/pelvis showed small bowel obstruction with dilated ileal segment up to 4 cm, sigmoid colitis, diffuse gastric wall thickening, cholelithiasis.  General surgery was consulted and was admitted for the management of small bowel obstruction.  Nephrology consulted for dialysis.  General surgery did  diagnostic laparoscopy which was converted to ex lap with extended ileocecectomy and end ileostomy on 05/04/2023.S/P IR fistulogram and balloon angioplasty 05/14/23.  Prolonged hospitalization.  Assessment & Plan:  Principal Problem:   SBO (small bowel obstruction) (HCC) Active Problems:   Ischemic disease of gut (HCC)   Protein-calorie malnutrition, moderate (HCC)  Partial SBO: Presented with abdominal pain, diarrhea.  Found to have elevated lactic acid on presentation.  CT abdomen/pelvis showed small bowel obstruction, colitis.  Did not respond to conservative management, IV antibiotics.  General surgery diagnostic laparoscopy which was converted to ex lap with extended ileocecectomy and end ileostomy on 05/04/2023.  Antibiotics discontinued and wound VAC removed on 9/28. General surgery following. CT abd/pelvis on 10/ 5 showed diffuse colon wall thickening consistent with colitis,marked atheromatous disease,mild small-bowel dilatation right lower abdomen proximal to right lower quadrant ileostomy.  Abdomen is soft, nondistended.  Does not complain of any  abdomen pain today.  General surgery changed the dressing.  Has ileostomy with output  ESRD on dialysis: Nephrology following.  AV fistula found to be clotted.status post temporary dialysis catheter placement.  IR did fistulogram and balloon angioplasty of cephalic vein and central vein on 05/14/2023  Metabolic bone disease of CKD: Continue sevelamer, Sensipar  Anemia of chronic disease: Hemoglobin in the range of 7.  Continue to monitor.  Associated with CKD.  Hypertension: Previously taking isosorbide mononitrate, which had been discontinued due to hypotension.  Started on midodrine  Peripheral vascular disease/hyperlipidemia: Status post bilateral BKA  Leukocytosis: Has mild leukocytosis.  She is off antibiotics for now.  Continue to monitor  Goals of care: Palliative care following for goals of care.  Current  recommendation is to continue full scope of treatment.Goal is to go back to [facility]  Pressure Injury 04/30/23 Sacrum Mid Stage 2 -  Partial thickness loss of dermis presenting as a shallow open injury with a red, pink wound bed without slough. 5 cm x 7 cm pink dry (Active)  04/30/23   Location: Sacrum  Location Orientation: Mid  Staging: Stage 2 -  Partial thickness loss of dermis presenting as a shallow open injury with a red, pink wound bed without slough.  Wound Description (Comments): 5 cm x 7 cm pink dry  Present on Admission: Yes          Nutrition Problem: Moderate Malnutrition Etiology: chronic illness Pressure Injury 04/30/23 Sacrum Mid Stage 2 -  Partial thickness loss of dermis presenting as a shallow open injury with a red, pink wound bed without slough. 5 cm x 7 cm pink dry (Active)  04/30/23   Location: Sacrum  Location Orientation: Mid  Staging: Stage 2 -  Partial thickness loss of dermis presenting as a shallow open injury with a red, pink wound bed without slough.  Wound Description (Comments): 5 cm x 7 cm pink dry  Present on Admission: Yes  Dressing Type  Foam - Lift dressing to assess site every shift 05/19/23 2100    DVT prophylaxis:heparin injection 5,000 Units Start: 04/30/23 0900     Code Status: Full Code  Family Communication: None at bedside  Patient status:Inpatient  Patient is from :SNF  Anticipated discharge to:SNF  Estimated DC date:after general surgery clearance   Consultants: General Surgery, vascular surgery, nephrology  Procedures: As above  Antimicrobials:  Anti-infectives (From admission, onward)    Start     Dose/Rate Route Frequency Ordered Stop   05/08/23 2200  cefTRIAXone (ROCEPHIN) 2 g in sodium chloride 0.9 % 100 mL IVPB        2 g 200 mL/hr over 30 Minutes Intravenous Every 24 hours 05/08/23 1613 05/11/23 2232   05/08/23 2200  metroNIDAZOLE (FLAGYL) IVPB 500 mg        500 mg 100 mL/hr over 60 Minutes Intravenous Every 12 hours 05/08/23 1613 05/11/23 2346   05/04/23 0200  cefTRIAXone (ROCEPHIN) 2 g in sodium chloride 0.9 % 100 mL IVPB        2 g 200 mL/hr over 30 Minutes Intravenous Every 24 hours 05/04/23 0102 05/08/23 0144   05/04/23 0200  metroNIDAZOLE (FLAGYL) IVPB 500 mg        500 mg 100 mL/hr over 60 Minutes Intravenous Every 12 hours 05/04/23 0102 05/09/23 1203   04/30/23 1800  ciprofloxacin (CIPRO) IVPB 400 mg  Status:  Discontinued        400 mg 200 mL/hr over 60 Minutes Intravenous Every 24 hours 04/30/23 1443 05/02/23 1140   04/30/23 1515  metroNIDAZOLE (FLAGYL) IVPB 500 mg  Status:  Discontinued        500 mg 100 mL/hr over 60 Minutes Intravenous Every 12 hours 04/30/23 1418 05/02/23 1140       Subjective: Patient seen and examined at the bedside today.  She is irritable like yesterday.  Food tray was at the bedside.  She was screaming and saying I want to eat.  She was hesitant to  let her be examined and dressing change.  She looks comfortable.  She does not complain of any abdominal pain.On room  air  Objective: Vitals:   05/19/23 2330 05/20/23 0340 05/20/23 0500 05/20/23  0801  BP: 121/73 97/64  104/62  Pulse: 79 66  74  Resp: 13 16  14   Temp:    98 F (36.7 C)  TempSrc:    Oral  SpO2:  100%    Weight:   49 kg     Intake/Output Summary (Last 24 hours) at 05/20/2023 1033 Last data filed at 05/19/2023 1600 Gross per 24 hour  Intake 480 ml  Output --  Net 480 ml   Filed Weights   05/18/23 0442 05/19/23 0711 05/20/23 0500  Weight: 48 kg 50 kg 49 kg    Examination:  General exam: Overall comfortable, not in distress, irritable, very deconditioned HEENT: Legally blind, temporary dialysis catheter on the left neck Respiratory system:  no wheezes or crackles  Cardiovascular system: S1 & S2 heard, RRR.  Gastrointestinal system: Abdomen is nondistended, soft .  Open surgical wound, ileostomy Central nervous system: Alert and awake  extremities: Bilateral BKA Skin: No rashes, no ulcers,no icterus     Data Reviewed: I have personally reviewed following labs and imaging studies  CBC: Recent Labs  Lab 05/15/23 0844 05/16/23 2011 05/17/23 0556 05/18/23 1800 05/19/23 0334  WBC 16.5* 15.8* 14.2* 20.5* 17.6*  HGB 7.3* 7.3* 7.9* 7.8* 7.4*  HCT 23.4* 24.1* 26.8* 26.1* 24.7*  MCV 85.7 85.2 87.3 87.6 86.4  PLT 367 305 338 342 332   Basic Metabolic Panel: Recent Labs  Lab 05/14/23 2228 05/15/23 0844 05/16/23 2011 05/17/23 0556 05/18/23 1800 05/19/23 0334  NA 134* 133* 135 135 128* 129*  K 4.4 4.4 4.7 4.6 5.2* 4.9  CL 93* 92* 94* 91* 89* 90*  CO2 29 26 28 29  19* 23  GLUCOSE 109* 76 120* 90 191* 247*  BUN 17 20 15 19 20  22*  CREATININE 4.37* 4.76* 4.34* 4.91* 4.81* 5.15*  CALCIUM 9.0 8.8* 9.2 9.2 9.0 9.3  PHOS 4.9* 5.1*  --   --   --   --      No results found for this or any previous visit (from the past 240 hour(s)).   Radiology Studies: CT ABDOMEN PELVIS W CONTRAST  Result Date: 05/18/2023 CLINICAL DATA:  Postop abdominal pain. EXAM: CT ABDOMEN AND PELVIS WITH CONTRAST TECHNIQUE: Multidetector CT imaging of the abdomen and pelvis  was performed using the standard protocol following bolus administration of intravenous contrast. RADIATION DOSE REDUCTION: This exam was performed according to the departmental dose-optimization program which includes automated exposure control, adjustment of the mA and/or kV according to patient size and/or use of iterative reconstruction technique. CONTRAST:  75mL OMNIPAQUE IOHEXOL 350 MG/ML SOLN COMPARISON:  05/09/2023. FINDINGS: Lower chest: Lung bases clear. No pleural or pericardial effusion. There are pacer wires. Hepatobiliary: Gallbladder is contracted and contains hyperdense contents that could be calcific stones or related to excreted contrast from prior study. Pancreas: Unremarkable. No pancreatic ductal dilatation or surrounding inflammatory changes. Spleen: Medial hypoattenuation similar to the prior study appears to be a perfusion anomaly. This is stable. No splenomegaly. Adrenals/Urinary Tract: Extensive vascular calcifications of both kidneys with no focal lesions or hydronephrosis identified. No adrenal lesions. Urinary bladder is empty and not well evaluated. Stomach/Bowel: Right lower quadrant ostomy. Diffuse colon wall thickening consistent with colitis. Mild dilatation of small bowel in the lower right abdomen. Vascular/Lymphatic: Marked extensive diffuse atheromatous disease of the aorta and other arterial structures. No evidence of aneurysm. Reproductive: Uterine cavity contains fluid. No definite adnexal pathology, but the evaluation is limited. Other: Deep wide anterior abdominal wall open defect. Musculoskeletal: No acute or significant osseous findings. IMPRESSION: 1. Large anterior abdominal wall open defect. 2. Diffuse colon wall thickening consistent with colitis. 3. 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 4. Gallbladder contracted containing stones or excreted contrast. 5. Marked atheromatous disease. 6. Mild small-bowel dilatation right lower abdomen  proximal to right lower quadrant ileostomy. 7. Nonspecific fluid attenuation in the uterine cavity. Electronically Signed   By: Layla Maw M.D.   On: 05/18/2023 18:59    Scheduled Meds:  Chlorhexidine Gluconate Cloth  6 each Topical Q0600   darbepoetin (ARANESP) injection - DIALYSIS  60 mcg Subcutaneous Q Fri-1800   doxercalciferol  4 mcg Intravenous Q M,W,F-HD   dronabinol  5 mg Oral BID AC   feeding supplement  237 mL Oral TID BM   ferrous sulfate  325 mg Oral BID WC   heparin  5,000 Units Subcutaneous Q8H   insulin aspart  0-6 Units Subcutaneous TID WC   midodrine  10 mg Oral TID WC   pantoprazole  40 mg Oral QHS   polycarbophil  1,250 mg Oral BID   sevelamer carbonate  0.8 g Oral TID WC  sodium hypochlorite   Irrigation BID   Continuous Infusions:   LOS: 20 days   Burnadette Pop, MD [facility] P10/02/2023, 10:33 AM

## 2023-05-20 NOTE — Progress Notes (Addendum)
Dilaudid 0.5 mg IV given during HD.  05/20/23 1554  Vitals  Temp 98.5 F (36.9 C)  Temp Source Oral  BP 104/65  BP Location Left Arm  BP Method Automatic  Patient Position (if appropriate) Lying  Pulse Rate 93  Pulse Rate Source Monitor  Resp 17  Oxygen Therapy  SpO2 94 %  O2 Device Room Air  During Treatment Monitoring  Blood Flow Rate (mL/min) 299 mL/min  Arterial Pressure (mmHg) -142.42 mmHg  Venous Pressure (mmHg) 204.84 mmHg  TMP (mmHg) 2.22 mmHg  Ultrafiltration Rate (mL/min) 428 mL/min  Dialysate Flow Rate (mL/min) 299 ml/min  Dialysate Potassium Concentration 2  Dialysate Calcium Concentration 2.5  Duration of HD Treatment -hour(s) 2.99 hour(s)  Cumulative Fluid Removed (mL) per Treatment  -302.35  HD Safety Checks Performed Yes  Intra-Hemodialysis Comments Tx completed  Post Treatment  Dialyzer Clearance Clotted  Hemodialysis Intake (mL) 300 mL  Liters Processed 56  Fluid Removed (mL) 0 mL  Tolerated HD Treatment Yes  Post-Hemodialysis Comments Pt goal is not met d/t hypotension.  AVG/AVF Arterial Site Held (minutes) 10 minutes  AVG/AVF Venous Site Held (minutes) 10 minutes  Fistula / Graft Right Upper arm Arteriovenous fistula  Placement Date/Time: 05/15/23 4098   Placed prior to admission: Yes  Orientation: Right  Access Location: Upper arm  Access Type: Arteriovenous fistula  Site Condition No complications  Fistula / Graft Assessment Present;Thrill;Bruit  Status Deaccessed  Needle Size 15  Drainage Description None

## 2023-05-20 NOTE — Progress Notes (Signed)
Patient ID: [name], female   DOB: 06-15-1981, 42 y.o.   MRN: [id number] [facility] Surgery Progress Note  16 Days Post-Op  Subjective: She tells me she is ready to get out of here, hungry and wants to eat breakfast. Took a lot of convincing to allow myself/RN and TRH to examine abdomen.   Objective: Vital signs in last 24 hours: Temp:  [98 F (36.7 C)-99.3 F (37.4 C)] 98 F (36.7 C) (10/07 0801) Pulse Rate:  [66-91] 74 (10/07 0801) Resp:  [12-20] 14 (10/07 0801) BP: (97-132)/(52-87) 104/62 (10/07 0801) SpO2:  [99 %-100 %] 100 % (10/07 0340) Weight:  [49 kg] 49 kg (10/07 0500) Last BM Date : 05/19/23  Intake/Output from previous day: 10/06 0701 - 10/07 0700 In: 480 [P.O.:480] Out: -  Intake/Output this shift: No intake/output data recorded.  PE: General: NAD Lungs: Normal rate and effort Abd: Abdomen is very soft on exam without rigidity or guarding. Appears ND. +BS. Ileostomy leaking with large ostomy appliance. Midline wound w/ mixed granulation tissue and fibrinous tissue  Msk: B/l LE AKA    Lab Results:  Recent Labs    05/18/23 1800 05/19/23 0334  WBC 20.5* 17.6*  HGB 7.8* 7.4*  HCT 26.1* 24.7*  PLT 342 332   BMET Recent Labs    05/18/23 1800 05/19/23 0334  NA 128* 129*  K 5.2* 4.9  CL 89* 90*  CO2 19* 23  GLUCOSE 191* 247*  BUN 20 22*  CREATININE 4.81* 5.15*  CALCIUM 9.0 9.3   PT/INR No results for input(s): "LABPROT", "INR" in the last 72 hours.  CMP     Component Value Date/Time   NA 129 (L) 05/19/2023 0334   NA 133 (L) 05/18/2020 0927   K 4.9 05/19/2023 0334   CL 90 (L) 05/19/2023 0334   CO2 23 05/19/2023 0334   GLUCOSE 247 (H) 05/19/2023 0334   BUN 22 (H) 05/19/2023 0334   BUN 45 (H) 05/18/2020 0927   CREATININE 5.15 (H) 05/19/2023 0334   CALCIUM 9.3 05/19/2023 0334   CALCIUM 12.1 (H) 11/13/2021 2026   PROT 6.2 (L) 05/01/2023 1020   PROT 7.6 05/18/2020 0927   ALBUMIN 1.9 (L) 05/15/2023 0844   ALBUMIN 3.9 05/18/2020 0927    AST 26 05/01/2023 1020   ALT 18 05/01/2023 1020   ALKPHOS 72 05/01/2023 1020   BILITOT 0.9 05/01/2023 1020   BILITOT <0.2 05/18/2020 0927   GFRNONAA 10 (L) 05/19/2023 0334   GFRAA 6 (L) 05/18/2020 0927   Lipase     Component Value Date/Time   LIPASE 40 12/20/2022 0918       Studies/Results: CT ABDOMEN PELVIS W CONTRAST  Result Date: 05/18/2023 CLINICAL DATA:  Postop abdominal pain. EXAM: CT ABDOMEN AND PELVIS WITH CONTRAST TECHNIQUE: Multidetector CT imaging of the abdomen and pelvis was performed using the standard protocol following bolus administration of intravenous contrast. RADIATION DOSE REDUCTION: This exam was performed according to the departmental dose-optimization program which includes automated exposure control, adjustment of the mA and/or kV according to patient size and/or use of iterative reconstruction technique. CONTRAST:  75mL OMNIPAQUE IOHEXOL 350 MG/ML SOLN COMPARISON:  05/09/2023. FINDINGS: Lower chest: Lung bases clear. No pleural or pericardial effusion. There are pacer wires. Hepatobiliary: Gallbladder is contracted and contains hyperdense contents that could be calcific stones or related to excreted contrast from prior study. Pancreas: Unremarkable. No pancreatic ductal dilatation or surrounding inflammatory changes. Spleen: Medial hypoattenuation similar to the prior study appears to be a perfusion anomaly.  This is stable. No splenomegaly. Adrenals/Urinary Tract: Extensive vascular calcifications of both kidneys with no focal lesions or hydronephrosis identified. No adrenal lesions. Urinary bladder is empty and not well evaluated. Stomach/Bowel: Right lower quadrant ostomy. Diffuse colon wall thickening consistent with colitis. Mild dilatation of small bowel in the lower right abdomen. Vascular/Lymphatic: Marked extensive diffuse atheromatous disease of the aorta and other arterial structures. No evidence of aneurysm. Reproductive: Uterine cavity contains fluid. No  definite adnexal pathology, but the evaluation is limited. Other: Deep wide anterior abdominal wall open defect. Musculoskeletal: No acute or significant osseous findings. IMPRESSION: 1. Large anterior abdominal wall open defect. 2. Diffuse colon wall thickening consistent with colitis. 3. 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 4. Gallbladder contracted containing stones or excreted contrast. 5. Marked atheromatous disease. 6. Mild small-bowel dilatation right lower abdomen proximal to right lower quadrant ileostomy. 7. Nonspecific fluid attenuation in the uterine cavity. Electronically Signed   By: Layla Maw M.D.   On: 05/18/2023 18:59   DG CHEST PORT 1 VIEW  Result Date: 05/18/2023 CLINICAL DATA:  Hypoxia EXAM: PORTABLE CHEST 1 VIEW COMPARISON:  04/30/2023 FINDINGS: Dialysis catheter from a left-sided approach terminates at the mid to low right atrium. Patient rotated minimally left. Midline trachea. Normal heart size. No pleural effusion or pneumothorax. Clear lungs. IMPRESSION: No active disease. Electronically Signed   By: Jeronimo Greaves M.D.   On: 05/18/2023 14:30    Anti-infectives: Anti-infectives (From admission, onward)    Start     Dose/Rate Route Frequency Ordered Stop   05/08/23 2200  cefTRIAXone (ROCEPHIN) 2 g in sodium chloride 0.9 % 100 mL IVPB        2 g 200 mL/hr over 30 Minutes Intravenous Every 24 hours 05/08/23 1613 05/11/23 2232   05/08/23 2200  metroNIDAZOLE (FLAGYL) IVPB 500 mg        500 mg 100 mL/hr over 60 Minutes Intravenous Every 12 hours 05/08/23 1613 05/11/23 2346   05/04/23 0200  cefTRIAXone (ROCEPHIN) 2 g in sodium chloride 0.9 % 100 mL IVPB        2 g 200 mL/hr over 30 Minutes Intravenous Every 24 hours 05/04/23 0102 05/08/23 0144   05/04/23 0200  metroNIDAZOLE (FLAGYL) IVPB 500 mg        500 mg 100 mL/hr over 60 Minutes Intravenous Every 12 hours 05/04/23 0102 05/09/23 1203   04/30/23 1800  ciprofloxacin (CIPRO) IVPB 400 mg   Status:  Discontinued        400 mg 200 mL/hr over 60 Minutes Intravenous Every 24 hours 04/30/23 1443 05/02/23 1140   04/30/23 1515  metroNIDAZOLE (FLAGYL) IVPB 500 mg  Status:  Discontinued        500 mg 100 mL/hr over 60 Minutes Intravenous Every 12 hours 04/30/23 1418 05/02/23 1140        Assessment/Plan 42 yo female presenting with abdominal pain, diarrhea, low grade fevers, dilated small bowel and colonic wall thickening on CT   POD#16 s/p diagnostic lap converted to ex-lap with extended ileo-cecectomy (mid-ileum to just past cecum), 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 Hillery Hunter - Intra-op findings were notable for adhesions in the RLQ w/ associated twisting of the mesentery resulting in SB ischemia. - CT 9/26 ordered due to leukocytosis but overall looks ok, some residual small bowel thickening but no large abscess. Off IV abx as of 9/28. - CT 10/5 for hypotension also overall looks okay. Some thickening of the colon wall and mild small-bowel dilatation. HDS improved. WBC  downtrending.  - Dakin's wet to dry dressings  - Unclear ileostomy output with leaking. Strict I/O. Continue BID iron, BID fiber, and TID imodium. Titrate as needed.  - mobilize, continue therapies - rec SNF - Restart diet. On Marinol   FEN:  Renal diet, Glucerna ID: Rocephin/Flagyl 9/21 > 9/28 VTE: SCDs, SQH q 8h Foley: out    LOS: 20 days   Juliet Rude, [facility] 05/20/2023, 9:39 AM Please see Amion for pager number during day hours 7:00am-4:30pm

## 2023-05-21 DIAGNOSIS — K56609 Unspecified intestinal obstruction, unspecified as to partial versus complete obstruction: Secondary | ICD-10-CM | POA: Diagnosis not present

## 2023-05-21 LAB — GLUCOSE, CAPILLARY
Glucose-Capillary: 83 mg/dL (ref 70–99)
Glucose-Capillary: 94 mg/dL (ref 70–99)
Glucose-Capillary: 109 mg/dL — ABNORMAL HIGH (ref 70–99)
Glucose-Capillary: 122 mg/dL — ABNORMAL HIGH (ref 70–99)
Glucose-Capillary: 124 mg/dL — ABNORMAL HIGH (ref 70–99)

## 2023-05-21 LAB — BASIC METABOLIC PANEL
Anion gap: 16 — ABNORMAL HIGH (ref 5–15)
BUN: 17 mg/dL (ref 6–20)
CO2: 24 mmol/L (ref 22–32)
Calcium: 9.2 mg/dL (ref 8.9–10.3)
Chloride: 92 mmol/L — ABNORMAL LOW (ref 98–111)
Creatinine, Ser: 3.8 mg/dL — ABNORMAL HIGH (ref 0.44–1.00)
GFR, Estimated: 15 mL/min — ABNORMAL LOW (ref >60)
Glucose, Bld: 92 mg/dL (ref 70–99)
Potassium: 4 mmol/L (ref 3.5–5.1)
Sodium: 132 mmol/L — ABNORMAL LOW (ref 135–145)

## 2023-05-21 LAB — CBC
HCT: 27.1 % — ABNORMAL LOW (ref 36.0–46.0)
Hemoglobin: 8.2 g/dL — ABNORMAL LOW (ref 12.0–15.0)
MCH: 26.2 pg (ref 26.0–34.0)
MCHC: 30.3 g/dL (ref 30.0–36.0)
MCV: 86.6 fL (ref 80.0–100.0)
Platelets: 334 10*3/uL (ref 150–400)
RBC: 3.13 MIL/uL — ABNORMAL LOW (ref 3.87–5.11)
RDW: 19.4 % — ABNORMAL HIGH (ref 11.5–15.5)
WBC: 17.7 10*3/uL — ABNORMAL HIGH (ref 4.0–10.5)
nRBC: 0.8 % — ABNORMAL HIGH (ref 0.0–0.2)

## 2023-05-21 MED ORDER — CHLORHEXIDINE GLUCONATE CLOTH 2 % EX PADS: 6.0000 | MEDICATED_PAD | Freq: Every day | CUTANEOUS | Status: DC

## 2023-05-21 NOTE — Progress Notes (Signed)
[facility] Progress Note   Subjective:   Patient seen and examined at bedside.  Answers "I need a pain pill" to all questions.    Objective Vitals:   05/20/23 1603 05/21/23 0253 05/21/23 0816 05/21/23 1148  BP:  (!) 90/59 (!) 79/55 (!) 85/61  Pulse:  88 93   Resp:  16 17   Temp: 98.5 F (36.9 C)  98.2 F (36.8 C) 98.6 F (37 C)  TempSrc: Oral  Oral Oral  SpO2:      Weight: 49.3 kg      Physical Exam General:chronically ill appearing female in NAD Heart:RRR, no mrg Lungs:CTAB, nml WOB on RA Abdomen:soft, NTND Extremities:b/l AKA, no edema Dialysis Access: RU AVF +b/t   Filed Weights   05/20/23 0500 05/20/23 1142 05/20/23 1603  Weight: 49 kg 49 kg 49.3 kg    Intake/Output Summary (Last 24 hours) at 05/21/2023 1412 Last data filed at 05/20/2023 2200 Gross per 24 hour  Intake 60 ml  Output 0 ml  Net 60 ml    Additional Objective Labs: Basic Metabolic Panel: Recent Labs  Lab 05/14/23 2228 05/15/23 0844 05/16/23 2011 05/18/23 1800 05/19/23 0334 05/21/23 0356  NA 134* 133*   < > 128* 129* 132*  K 4.4 4.4   < > 5.2* 4.9 4.0  CL 93* 92*   < > 89* 90* 92*  CO2 29 26   < > 19* 23 24  GLUCOSE 109* 76   < > 191* 247* 92  BUN 17 20   < > 20 22* 17  CREATININE 4.37* 4.76*   < > 4.81* 5.15* 3.80*  CALCIUM 9.0 8.8*   < > 9.0 9.3 9.2  PHOS 4.9* 5.1*  --   --   --   --    < > = values in this interval not displayed.   Liver Function Tests: Recent Labs  Lab 05/14/23 2228 05/15/23 0844  ALBUMIN 2.0* 1.9*   CBC: Recent Labs  Lab 05/16/23 2011 05/17/23 0556 05/18/23 1800 05/19/23 0334 05/21/23 0356  WBC 15.8* 14.2* 20.5* 17.6* 17.7*  HGB 7.3* 7.9* 7.8* 7.4* 8.2*  HCT 24.1* 26.8* 26.1* 24.7* 27.1*  MCV 85.2 87.3 87.6 86.4 86.6  PLT 305 338 342 332 334   CBG: Recent Labs  Lab 05/20/23 0641 05/20/23 1649 05/21/23 0632 05/21/23 0819 05/21/23 1136  GLUCAP 141* 91 83 94 109*   Iron Studies:  Recent Labs    05/20/23 1844  IRON 68   TIBC 111*   Lab Results  Component Value Date   INR 1.9 (H) 05/14/2023   INR 1.3 (H) 04/30/2023   INR 1.3 (H) 06/28/2022   Studies/Results: No results found.  Medications:   Chlorhexidine Gluconate Cloth  6 each Topical Q0600   darbepoetin (ARANESP) injection - DIALYSIS  60 mcg Subcutaneous Q Fri-1800   doxercalciferol  4 mcg Intravenous Q M,W,F-HD   dronabinol  5 mg Oral BID AC   feeding supplement  237 mL Oral TID BM   ferrous sulfate  325 mg Oral BID WC   heparin  5,000 Units Subcutaneous Q8H   insulin aspart  0-6 Units Subcutaneous TID WC   lanthanum  1,000 mg Oral TID WC   midodrine  10 mg Oral TID WC   pantoprazole  40 mg Oral QHS   polycarbophil  1,250 mg Oral BID   sodium hypochlorite   Irrigation BID    Dialysis Orders: [facility] MWF  3h  400/1.5  49kg  R AVF  Hep none - last OP HD 9/16, post wt 51kg, coming off 2- 4kg over the last 3 wks - mircera IV q 2 wks, last 9/13, due 9/27 - hectorol 4 mcg IV three times per week.    Assessment/Plan: 1. 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 Hillery Hunter on 05/04/23. Surgery following 2. Sepsis 2/2 ischemic colitis - completed IV ABX on 9/28. Per PMD.  3. ESRD - on MWF.  Continue HD per regular schedule. No UF with HD.  4. HD access - Unable to cannulate AV fistula on 9/27 because of pulling clots.  IR placed temporary HD catheter and pt received dialysis on 9/27.  Duplex US of AVF showed low blood flow, vascular surgeon and IR were consulted.  Pt went for fistulogram 10/1 by IR. There was a R cephalic stenosis treated w/ venoplasty, and a central stenosis also rx'd w/ venoplasty. 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 This area was marked and we are not to stick within the circular marking. The rest of the AVF is good to stick; did well w/ HD on 10/7.  5. Anemia of CKD- Hgb ^8.2. Continue ESA. Check  iron panel. Transfuse prn for Hgb<7. 6. Secondary hyperparathyroidism - Ca at goal.  Senispar on hold d/t GI issues.  Continue VDRA.  Phos 5.1, change binders from sevelamer d/t SBO to Fosrenol.  7. HTN/volume - BP soft, on midodrine.  Does not appear volume overloaded. Significant GI losses during admission.  Did not tolerate volume removal last week.  Plan to run even for now.  8. Nutrition - Renal diet w/fluid restrictions.   Virgina Norfolk, PA-C [facility] 05/21/2023,2:12 PM  LOS: 21 days

## 2023-05-21 NOTE — Progress Notes (Signed)
Patient ID: [name], female   DOB: [birth year], 42 y.o.   MRN: [id number] [facility] Surgery Progress Note  17 Days Post-Op  Subjective: She is complaining of needing more pillows this AM.  Spoke with bedside RN who reports dressing was changed early this AM and wound stable from yesterday. Still having issues with ileostomy leaking but better with smaller appliance.   Objective: Vital signs in last 24 hours: Temp:  [97.7 F (36.5 C)-98.5 F (36.9 C)] 98.2 F (36.8 C) (10/08 0816) Pulse Rate:  [77-96] 93 (10/08 0816) Resp:  [9-17] 17 (10/08 0816) BP: (74-104)/(55-70) 79/55 (10/08 0816) SpO2:  [94 %-100 %] 94 % (10/07 1554) Weight:  [49 kg-49.3 kg] 49.3 kg (10/07 1603) Last BM Date : 05/20/23  Intake/Output from previous day: 10/07 0701 - 10/08 0700 In: 60 [P.O.:60] Out: 0  Intake/Output this shift: No intake/output data recorded.  PE: General: NAD Lungs: Normal rate and effort Abd: Abdomen is very soft on exam without rigidity or guarding. Appears ND. Ileostomy with ensure consistency output. Midline dressing C/D/I Msk: B/l LE AKA    Lab Results:  Recent Labs    05/19/23 0334 05/21/23 0356  WBC 17.6* 17.7*  HGB 7.4* 8.2*  HCT 24.7* 27.1*  PLT 332 334   BMET Recent Labs    05/19/23 0334 05/21/23 0356  NA 129* 132*  K 4.9 4.0  CL 90* 92*  CO2 23 24  GLUCOSE 247* 92  BUN 22* 17  CREATININE 5.15* 3.80*  CALCIUM 9.3 9.2   PT/INR No results for input(s): "LABPROT", "INR" in the last 72 hours.  CMP     Component Value Date/Time   NA 132 (L) 05/21/2023 0356   NA 133 (L) 05/18/2020 0927   K 4.0 05/21/2023 0356   CL 92 (L) 05/21/2023 0356   CO2 24 05/21/2023 0356   GLUCOSE 92 05/21/2023 0356   BUN 17 05/21/2023 0356   BUN 45 (H) 05/18/2020 0927   CREATININE 3.80 (H) 05/21/2023 0356   CALCIUM 9.2 05/21/2023 0356   CALCIUM 12.1 (H) 11/13/2021 2026   PROT 6.2 (L) 05/01/2023 1020   PROT 7.6 05/18/2020 0927   ALBUMIN 1.9 (L) 05/15/2023 0844    ALBUMIN 3.9 05/18/2020 0927   AST 26 05/01/2023 1020   ALT 18 05/01/2023 1020   ALKPHOS 72 05/01/2023 1020   BILITOT 0.9 05/01/2023 1020   BILITOT <0.2 05/18/2020 0927   GFRNONAA 15 (L) 05/21/2023 0356   GFRAA 6 (L) 05/18/2020 0927   Lipase     Component Value Date/Time   LIPASE 40 12/20/2022 0918       Studies/Results: No results found.  Anti-infectives: Anti-infectives (From admission, onward)    Start     Dose/Rate Route Frequency Ordered Stop   05/08/23 2200  cefTRIAXone (ROCEPHIN) 2 g in sodium chloride 0.9 % 100 mL IVPB        2 g 200 mL/hr over 30 Minutes Intravenous Every 24 hours 05/08/23 1613 05/11/23 2232   05/08/23 2200  metroNIDAZOLE (FLAGYL) IVPB 500 mg        500 mg 100 mL/hr over 60 Minutes Intravenous Every 12 hours 05/08/23 1613 05/11/23 2346   05/04/23 0200  cefTRIAXone (ROCEPHIN) 2 g in sodium chloride 0.9 % 100 mL IVPB        2 g 200 mL/hr over 30 Minutes Intravenous Every 24 hours 05/04/23 0102 05/08/23 0144   05/04/23 0200  metroNIDAZOLE (FLAGYL) IVPB 500 mg        500  mg 100 mL/hr over 60 Minutes Intravenous Every 12 hours 05/04/23 0102 05/09/23 1203   04/30/23 1800  ciprofloxacin (CIPRO) IVPB 400 mg  Status:  Discontinued        400 mg 200 mL/hr over 60 Minutes Intravenous Every 24 hours 04/30/23 1443 05/02/23 1140   04/30/23 1515  metroNIDAZOLE (FLAGYL) IVPB 500 mg  Status:  Discontinued        500 mg 100 mL/hr over 60 Minutes Intravenous Every 12 hours 04/30/23 1418 05/02/23 1140        Assessment/Plan 42 yo female presenting with abdominal pain, diarrhea, low grade fevers, dilated small bowel and colonic wall thickening on CT   POD#17 s/p diagnostic lap converted to ex-lap with extended ileo-cecectomy (mid-ileum to just past cecum), 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 Hillery Hunter - Intra-op findings were notable for adhesions in the RLQ w/ associated twisting of the mesentery resulting in SB ischemia. - CT 9/26 ordered due to leukocytosis but overall  looks ok, some residual small bowel thickening but no large abscess. Off IV abx as of 9/28. - CT 10/5 for hypotension also overall looks okay. Some thickening of the colon wall and mild small-bowel dilatation. HDS improved. WBC downtrending.  - Dakin's wet to dry dressings and then switch back to saline tomorrow - consider replacing VAC this week  - Unclear ileostomy output with leaking. Strict I/O. Continue BID iron, BID fiber, and TID imodium. Titrate as needed.  - mobilize, continue therapies - rec SNF - Restart diet. On Marinol   FEN:  Renal diet, Glucerna ID: Rocephin/Flagyl 9/21 > 9/28 VTE: SCDs, SQH q 8h Foley: out    LOS: 21 days   Juliet Rude, [facility] Surgery 05/21/2023, 10:46 AM Please see Amion for pager number during day hours 7:00am-4:30pm

## 2023-05-21 NOTE — Plan of Care (Signed)
  Problem: Coping: Goal: Ability to adjust to condition or change in health will improve Outcome: Not Progressing   Problem: Fluid Volume: Goal: Ability to maintain a balanced intake and output will improve Outcome: Not Progressing   Problem: Health Behavior/Discharge Planning: Goal: Ability to identify and utilize available resources and services will improve Outcome: Not Progressing Goal: Ability to manage health-related needs will improve Outcome: Not Progressing   Problem: Nutritional: Goal: Progress toward achieving an optimal weight will improve Outcome: Not Progressing   Problem: Skin Integrity: Goal: Risk for impaired skin integrity will decrease Outcome: Not Progressing   Problem: Tissue Perfusion: Goal: Adequacy of tissue perfusion will improve Outcome: Not Progressing   Problem: Activity: Goal: Risk for activity intolerance will decrease Outcome: Not Progressing   Problem: Coping: Goal: Level of anxiety will decrease Outcome: Not Progressing

## 2023-05-21 NOTE — Progress Notes (Signed)
PROGRESS NOTE  [name]  ZOX:[id number] DOB: [birth year] DOA: 04/30/2023 PCP: Inc, [facility]   Brief Narrative: Patient is a 42 year old female with history of type 1 diabetes with diabetic retinopathy/blindness, peripheral vascular disease status post bilateral BKA, ESRD on dialysis, hypertension, hyperlipidemia, CAD, mesenteric ischemia with chronic pneumatosis who initially presented from [facility] with complaint of acute onset abdominal pain, diarrhea.  On presentation, she was febrile, tachycardic.  CT abdomen/pelvis showed small bowel obstruction with dilated ileal segment up to 4 cm, sigmoid colitis, diffuse gastric wall thickening, cholelithiasis.  General surgery was consulted and was admitted for the management of small bowel obstruction.  Nephrology consulted for dialysis.  General surgery did  diagnostic laparoscopy which was converted to ex lap with extended ileocecectomy and end ileostomy on 05/04/2023.S/P IR fistulogram and balloon angioplasty 05/14/23.  Prolonged hospitalization.  Assessment & Plan:  Principal Problem:   SBO (small bowel obstruction) (HCC) Active Problems:   Ischemic disease of gut (HCC)   Protein-calorie malnutrition, moderate (HCC)  Partial SBO: Presented with abdominal pain, diarrhea.  Found to have elevated lactic acid on presentation.  CT abdomen/pelvis showed small bowel obstruction, colitis.  Did not respond to conservative management, IV antibiotics.  General surgery diagnostic laparoscopy which was converted to ex lap with extended ileocecectomy and end ileostomy on 05/04/2023.  Antibiotics discontinued and wound VAC removed on 9/28. General surgery following. CT abd/pelvis on 10/ 5 showed diffuse colon wall thickening consistent with colitis,marked atheromatous disease,mild small-bowel dilatation right lower abdomen proximal to right lower quadrant ileostomy.  Abdomen is soft, nondistended.  Does not complain of any  abdomen pain .  General surgery changed the dressing on 10/7.  Has ileostomy with output.Has a big open abdominal wound  ESRD on dialysis: Nephrology following.  AV fistula found to be clotted.status post temporary dialysis catheter placement.  IR did fistulogram and balloon angioplasty of cephalic vein and central vein on 05/14/2023  Metabolic bone disease of CKD: Continue sevelamer, Sensipar  Anemia of chronic disease: Hemoglobin in the range of 7.  Continue to monitor.  Associated with CKD.  Hypertension: Previously taking isosorbide mononitrate, which had been discontinued due to hypotension.  Started on midodrine,BP soft today but she is asymptomatic  Peripheral vascular disease/hyperlipidemia: Status post bilateral BKA  Leukocytosis: Has mild leukocytosis.  She is off antibiotics for now.  Continue to monitor  Loose incisor teeth: I tried to contact dentist multiple times today, unsuccessful.  Goals of care: Palliative care following for goals of care.  Current  recommendation is to continue full scope of treatment.Goal is to go back to SNF after general surgery clearance  Pressure Injury 04/30/23 Sacrum Mid Stage 2 -  Partial thickness loss of dermis presenting as a shallow open injury with a red, pink wound bed without slough. 5 cm x 7 cm pink dry (Active)  04/30/23   Location: Sacrum  Location Orientation: Mid  Staging: Stage 2 -  Partial thickness loss of dermis presenting as a shallow open injury with a red, pink wound bed without slough.  Wound Description (Comments): 5 cm x 7 cm pink dry  Present on Admission: Yes          Nutrition Problem: Moderate Malnutrition Etiology: chronic illness Pressure Injury 04/30/23 Sacrum Mid Stage 2 -  Partial thickness loss of dermis presenting as a shallow open injury with a red, pink wound bed without slough. 5 cm x 7 cm pink dry (Active)  04/30/23   Location: Sacrum  Location Orientation: Mid  Staging: Stage 2 -  Partial thickness  loss of dermis presenting as a shallow open injury with a red, pink wound bed without slough.  Wound Description (Comments): 5 cm x 7 cm pink dry  Present on Admission: Yes  Dressing Type Foam - Lift dressing to assess site every shift 05/20/23 2000    DVT prophylaxis:heparin injection 5,000 Units Start: 04/30/23 0900     Code Status: Full Code  Family Communication: called Sister Astronomer for update on 10/8, call not received  Patient status:Inpatient  Patient is from :SNF  Anticipated discharge to:SNF  Estimated DC date:after general surgery clearance   Consultants: General Surgery, vascular surgery, nephrology  Procedures: As above  Antimicrobials:  Anti-infectives (From admission, onward)    Start     Dose/Rate Route Frequency Ordered Stop   05/08/23 2200  cefTRIAXone (ROCEPHIN) 2 g in sodium chloride 0.9 % 100 mL IVPB        2 g 200 mL/hr over 30 Minutes Intravenous Every 24 hours 05/08/23 1613 05/11/23 2232   05/08/23 2200  metroNIDAZOLE (FLAGYL) IVPB 500 mg        500 mg 100 mL/hr over 60 Minutes Intravenous Every 12 hours 05/08/23 1613 05/11/23 2346   05/04/23 0200  cefTRIAXone (ROCEPHIN) 2 g in sodium chloride 0.9 % 100 mL IVPB        2 g 200 mL/hr over 30 Minutes Intravenous Every 24 hours 05/04/23 0102 05/08/23 0144   05/04/23 0200  metroNIDAZOLE (FLAGYL) IVPB 500 mg        500 mg 100 mL/hr over 60 Minutes Intravenous Every 12 hours 05/04/23 0102 05/09/23 1203   04/30/23 1800  ciprofloxacin (CIPRO) IVPB 400 mg  Status:  Discontinued        400 mg 200 mL/hr over 60 Minutes Intravenous Every 24 hours 04/30/23 1443 05/02/23 1140   04/30/23 1515  metroNIDAZOLE (FLAGYL) IVPB 500 mg  Status:  Discontinued        500 mg 100 mL/hr over 60 Minutes Intravenous Every 12 hours 04/30/23 1418 05/02/23 1140       Subjective: Patient seen and examined at bedside today.  She actually appears comfortable today.  Not irritable like yesterday.  She allowed me to examine  her.  She denies any abdomen pain, nausea, vomiting, chest pain or shortness of breath.  Lying on bed.  Very deconditioned.  Objective: Vitals:   05/20/23 1554 05/20/23 1603 05/21/23 0253 05/21/23 0816  BP: 104/65  (!) 90/59 (!) 79/55  Pulse: 93  88 93  Resp: 17  16 17   Temp: 98.5 F (36.9 C) 98.5 F (36.9 C)  98.2 F (36.8 C)  TempSrc: Oral Oral  Oral  SpO2: 94%     Weight:  49.3 kg      Intake/Output Summary (Last 24 hours) at 05/21/2023 0941 Last data filed at 05/20/2023 2200 Gross per 24 hour  Intake 60 ml  Output 0 ml  Net 60 ml   Filed Weights   05/20/23 0500 05/20/23 1142 05/20/23 1603  Weight: 49 kg 49 kg 49.3 kg    Examination:   General exam: Overall comfortable, not in distress, very debilitated, deconditioned HEENT: Legally blind, hemodialysis catheter on the left neck Respiratory system:  no wheezes or crackles  Cardiovascular system: S1 & S2 heard, RRR.  Gastrointestinal system: Abdomen is nondistended, soft.  Open surgical wound covered with dressing, ileostomy Central nervous system: Alert and oriented Extremities: Bilateral BKA Skin: No rashes, no ulcers,no  icterus      Data Reviewed: I have personally reviewed following labs and imaging studies  CBC: Recent Labs  Lab 05/16/23 2011 05/17/23 0556 05/18/23 1800 05/19/23 0334 05/21/23 0356  WBC 15.8* 14.2* 20.5* 17.6* 17.7*  HGB 7.3* 7.9* 7.8* 7.4* 8.2*  HCT 24.1* 26.8* 26.1* 24.7* 27.1*  MCV 85.2 87.3 87.6 86.4 86.6  PLT 305 338 342 332 334   Basic Metabolic Panel: Recent Labs  Lab 05/14/23 2228 05/15/23 0844 05/16/23 2011 05/17/23 0556 05/18/23 1800 05/19/23 0334 05/21/23 0356  NA 134* 133* 135 135 128* 129* 132*  K 4.4 4.4 4.7 4.6 5.2* 4.9 4.0  CL 93* 92* 94* 91* 89* 90* 92*  CO2 29 26 28 29  19* 23 24  GLUCOSE 109* 76 120* 90 191* 247* 92  BUN 17 20 15 19 20  22* 17  CREATININE 4.37* 4.76* 4.34* 4.91* 4.81* 5.15* 3.80*  CALCIUM 9.0 8.8* 9.2 9.2 9.0 9.3 9.2  PHOS 4.9* 5.1*  --    --   --   --   --      No results found for this or any previous visit (from the past 240 hour(s)).   Radiology Studies: No results found.  Scheduled Meds:  Chlorhexidine Gluconate Cloth  6 each Topical Q0600   darbepoetin (ARANESP) injection - DIALYSIS  60 mcg Subcutaneous Q Fri-1800   doxercalciferol  4 mcg Intravenous Q M,W,F-HD   dronabinol  5 mg Oral BID AC   feeding supplement  237 mL Oral TID BM   ferrous sulfate  325 mg Oral BID WC   heparin  5,000 Units Subcutaneous Q8H   insulin aspart  0-6 Units Subcutaneous TID WC   lanthanum  1,000 mg Oral TID WC   midodrine  10 mg Oral TID WC   pantoprazole  40 mg Oral QHS   polycarbophil  1,250 mg Oral BID   sodium hypochlorite   Irrigation BID   Continuous Infusions:   LOS: 21 days   Burnadette Pop, MD [facility] P10/03/2023, 9:41 AM

## 2023-05-21 NOTE — TOC Progression Note (Signed)
Transition of Care [facility]) - Progression Note    Patient Details  Name: [name] MRN: [id number] Date of Birth: Feb 02, 1981  Transition of [facility] CM/SW Contact  Eduard Roux, [place] Phone Number: 05/21/2023, 4:20 PM  Clinical Narrative:     TOC continues to follow and will assist with discharge planning once stable.  Antony Blackbird, MSW, LCSW Clinical Social Worker    Expected Discharge Plan: Long Term Nursing Home Barriers to Discharge: Continued Medical Work up  Expected Discharge Plan and Services                                               Social Determinants of Health (SDOH) Interventions SDOH Screenings   Food Insecurity: No Food Insecurity (04/30/2023)  Housing: Low Risk  (04/30/2023)  Transportation Needs: No Transportation Needs (04/30/2023)  Utilities: Not At Risk (04/30/2023)  Depression (PHQ2-9): Low Risk  (05/11/2020)  Tobacco Use: Low Risk  (04/30/2023)    Readmission Risk Interventions    07/04/2022   10:49 AM 08/23/2021    3:42 PM  Readmission Risk Prevention Plan  Transportation Screening Complete Complete  HRI or Home Care Consult  Complete  Social Work Consult for Recovery Care Planning/Counseling  Complete  Palliative Care Screening  Not Applicable  Medication Review Oceanographer) Complete Complete  PCP or Specialist appointment within 3-5 days of discharge Complete   HRI or Home Care Consult Complete   SW Recovery Care/Counseling Consult Complete   Palliative Care Screening Not Applicable   Skilled Nursing Facility Complete

## 2023-05-22 DIAGNOSIS — N186 End stage renal disease: Secondary | ICD-10-CM

## 2023-05-22 DIAGNOSIS — E440 Moderate protein-calorie malnutrition: Secondary | ICD-10-CM

## 2023-05-22 DIAGNOSIS — K559 Vascular disorder of intestine, unspecified: Secondary | ICD-10-CM

## 2023-05-22 DIAGNOSIS — Z992 Dependence on renal dialysis: Secondary | ICD-10-CM

## 2023-05-22 DIAGNOSIS — R627 Adult failure to thrive: Secondary | ICD-10-CM

## 2023-05-22 DIAGNOSIS — K56609 Unspecified intestinal obstruction, unspecified as to partial versus complete obstruction: Secondary | ICD-10-CM | POA: Diagnosis not present

## 2023-05-22 LAB — BASIC METABOLIC PANEL
Anion gap: 16 — ABNORMAL HIGH (ref 5–15)
BUN: 28 mg/dL — ABNORMAL HIGH (ref 6–20)
CO2: 24 mmol/L (ref 22–32)
Calcium: 9.9 mg/dL (ref 8.9–10.3)
Chloride: 93 mmol/L — ABNORMAL LOW (ref 98–111)
Creatinine, Ser: 5.18 mg/dL — ABNORMAL HIGH (ref 0.44–1.00)
GFR, Estimated: 10 mL/min — ABNORMAL LOW (ref >60)
Glucose, Bld: 120 mg/dL — ABNORMAL HIGH (ref 70–99)
Potassium: 4.3 mmol/L (ref 3.5–5.1)
Sodium: 133 mmol/L — ABNORMAL LOW (ref 135–145)

## 2023-05-22 LAB — GLUCOSE, CAPILLARY
Glucose-Capillary: 101 mg/dL — ABNORMAL HIGH (ref 70–99)
Glucose-Capillary: 99 mg/dL (ref 70–99)
Glucose-Capillary: 109 mg/dL — ABNORMAL HIGH (ref 70–99)

## 2023-05-22 LAB — CBC
HCT: 25.5 % — ABNORMAL LOW (ref 36.0–46.0)
Hemoglobin: 7.6 g/dL — ABNORMAL LOW (ref 12.0–15.0)
MCH: 26 pg (ref 26.0–34.0)
MCHC: 29.8 g/dL — ABNORMAL LOW (ref 30.0–36.0)
MCV: 87.3 fL (ref 80.0–100.0)
Platelets: 359 10*3/uL (ref 150–400)
RBC: 2.92 MIL/uL — ABNORMAL LOW (ref 3.87–5.11)
RDW: 19.3 % — ABNORMAL HIGH (ref 11.5–15.5)
WBC: 14.9 10*3/uL — ABNORMAL HIGH (ref 4.0–10.5)
nRBC: 0.7 % — ABNORMAL HIGH (ref 0.0–0.2)

## 2023-05-22 NOTE — Progress Notes (Addendum)
PROGRESS NOTE  [name]  ZOX:[id number] DOB: May 20, 1981 DOA: 04/30/2023 PCP: Inc, [facility]   Brief Narrative: 42 year old [place] [facility] resident , type 1 diabetes with diabetic retinopathy/blindness, peripheral vascular disease status post bilateral BKA, ESRD on dialysis, hypertension, hyperlipidemia, CAD, mesenteric ischemia with chronic pneumatosis who initially presented with complaint of acute onset abdominal pain, diarrhea.  Septic on arrival  CT abdomen/pelvis showed small bowel obstruction with dilated ileal segment up to 4 cm, sigmoid colitis, diffuse gastric wall thickening, cholelithiasis.   General surgery was consulted and was admitted for the management of small bowel obstruction.   Nephrology consulted for dialysis.   General surgery did  diagnostic laparoscopy which was converted to ex lap with extended ileocecectomy and end ileostomy on 05/04/2023. S/P IR fistulogram and balloon angioplasty 05/14/23.  Prolonged hospitalization.  Assessment & Plan:  Principal Problem:   SBO (small bowel obstruction) (HCC) Active Problems:   Ischemic disease of gut (HCC)   Protein-calorie malnutrition, moderate (HCC)  Partial SBO: Did not respond to conservative management, IV antibiotics.  General surgery diagnostic laparoscopy which was converted to ex lap with extended ileocecectomy and end ileostomy on 05/04/2023.  Antibiotics discontinued and wound VAC removed on 9/28. General surgery following. CT abd/pelvis on 10/ 5 showed diffuse colon wall thickening consistent with colitis,marked atheromatous disease,mild small-bowel dilatation right lower abdomen proximal to right lower quadrant ileostomy.  Abdomen is soft, nondistended.  Does not complain of any abdomen pain .   General surgery changed the dressing on 10/7.  Has ileostomy with output. Has a big open abdominal wound which she occasionally refuses to have dressed--she is getting this dressed with Dakin's  solution?  VAC if she allows examination-in order to facilitate healing constipating agent such as twice daily iron fiber and 3 times daily Imodium have been started General Surgery feels that this wound will eventually close but patient refused exam 10/9  ESRD on dialysis: Nephrology following.  AV fistula angioplasty performed 10/1  Defer to nephrology further management continues iron menance 60 q. Friday, Hectorol MWF Check phosphorus next labs  Metabolic bone disease of CKD: Continue sevelamer, Sensipar-defer to renal-looks like they changed sevelamewr to fosrenal 1000 tid  Anemia of chronic disease: Hemoglobin in the range of 7.  Continue to monitor.  Associated with CKD. Iron studies are pending it appears  Hypotension: Discontinue this admission sosorbide mononitrate Midodrine initiated on 10/5  Peripheral vascular disease/hyperlipidemia: Status post bilateral BKA  Leukocytosis: Has mild leukocytosis.  She is off antibiotics for now.  Continue to monitor  Loose incisor teeth: I tried to contact dentist multiple times today, unsuccessful--- we do not have a dentist onsite  Goals of care: Palliative care following for goals of care.  Current  recommendation is to continue full scope of treatment.Goal is to go back to SNF after general surgery clearance  Pressure Injury 04/30/23 Sacrum Mid Stage 2 -  Partial thickness loss of dermis presenting as a shallow open injury with a red, pink wound bed without slough. 5 cm x 7 cm pink dry (Active)  04/30/23   Location: Sacrum  Location Orientation: Mid  Staging: Stage 2 -  Partial thickness loss of dermis presenting as a shallow open injury with a red, pink wound bed without slough.  Wound Description (Comments): 5 cm x 7 cm pink dry  Present on Admission: Yes          Nutrition Problem: Moderate Malnutrition Etiology: chronic illness Pressure Injury 04/30/23 Sacrum Mid Stage 2 -  Partial thickness loss of dermis presenting as a  shallow open injury with a red, pink wound bed without slough. 5 cm x 7 cm pink dry (Active)  04/30/23   Location: Sacrum  Location Orientation: Mid  Staging: Stage 2 -  Partial thickness loss of dermis presenting as a shallow open injury with a red, pink wound bed without slough.  Wound Description (Comments): 5 cm x 7 cm pink dry  Present on Admission: Yes  Dressing Type Foam - Lift dressing to assess site every shift 05/22/23 0900    DVT prophylaxis:heparin injection 5,000 Units Start: 04/30/23 0900     Code Status: Full Code  Family Communication: None   Patient status:Inpatient  Patient is from :SNF  Anticipated discharge to:SNF  Estimated DC date:after general surgery clearance   Consultants: General Surgery, vascular surgery, nephrology  Procedures: As above  Antimicrobials:  Anti-infectives (From admission, onward)    Start     Dose/Rate Route Frequency Ordered Stop   05/08/23 2200  cefTRIAXone (ROCEPHIN) 2 g in sodium chloride 0.9 % 100 mL IVPB        2 g 200 mL/hr over 30 Minutes Intravenous Every 24 hours 05/08/23 1613 05/11/23 2232   05/08/23 2200  metroNIDAZOLE (FLAGYL) IVPB 500 mg        500 mg 100 mL/hr over 60 Minutes Intravenous Every 12 hours 05/08/23 1613 05/11/23 2346   05/04/23 0200  cefTRIAXone (ROCEPHIN) 2 g in sodium chloride 0.9 % 100 mL IVPB        2 g 200 mL/hr over 30 Minutes Intravenous Every 24 hours 05/04/23 0102 05/08/23 0144   05/04/23 0200  metroNIDAZOLE (FLAGYL) IVPB 500 mg        500 mg 100 mL/hr over 60 Minutes Intravenous Every 12 hours 05/04/23 0102 05/09/23 1203   04/30/23 1800  ciprofloxacin (CIPRO) IVPB 400 mg  Status:  Discontinued        400 mg 200 mL/hr over 60 Minutes Intravenous Every 24 hours 04/30/23 1443 05/02/23 1140   04/30/23 1515  metroNIDAZOLE (FLAGYL) IVPB 500 mg  Status:  Discontinued        500 mg 100 mL/hr over 60 Minutes Intravenous Every 12 hours 04/30/23 1418 05/02/23 1140       Subjective:  Allow  me to examine her chest.  Seems to want to be left alone otherwise   Objective: Vitals:   05/22/23 0927 05/22/23 1211 05/22/23 1521 05/22/23 1612  BP: (!) 92/56 (!) 85/60 99/60 (!) 85/61  Pulse: 92  88   Resp: 16 15 14 15   Temp: 97.8 F (36.6 C) 98.6 F (37 C)  98.3 F (36.8 C)  TempSrc: Oral Oral  Axillary  SpO2: 90%  92%   Weight:        Intake/Output Summary (Last 24 hours) at 05/22/2023 1847 Last data filed at 05/22/2023 1500 Gross per 24 hour  Intake 100 ml  Output 200 ml  Net -100 ml   Filed Weights   05/20/23 1142 05/20/23 1603 05/22/23 0415  Weight: 49 kg 49.3 kg 48 kg    Examination:   Awake coherent hemodialysis catheter in left neck abdomen soft I did not examine the dressing or wound as she refuses same moving 4 limbs equally  Data Reviewed: I have personally reviewed following labs and imaging studies  CBC: Recent Labs  Lab 05/17/23 0556 05/18/23 1800 05/19/23 0334 05/21/23 0356 05/22/23 0450  WBC 14.2* 20.5* 17.6* 17.7* 14.9*  HGB 7.9* 7.8* 7.4* 8.2* 7.6*  HCT 26.8* 26.1* 24.7* 27.1* 25.5*  MCV 87.3 87.6 86.4 86.6 87.3  PLT 338 342 332 334 359   Basic Metabolic Panel: Recent Labs  Lab 05/17/23 0556 05/18/23 1800 05/19/23 0334 05/21/23 0356 05/22/23 0450  NA 135 128* 129* 132* 133*  K 4.6 5.2* 4.9 4.0 4.3  CL 91* 89* 90* 92* 93*  CO2 29 19* 23 24 24   GLUCOSE 90 191* 247* 92 120*  BUN 19 20 22* 17 28*  CREATININE 4.91* 4.81* 5.15* 3.80* 5.18*  CALCIUM 9.2 9.0 9.3 9.2 9.9     No results found for this or any previous visit (from the past 240 hour(s)).   Radiology Studies: No results found.  Scheduled Meds:  Chlorhexidine Gluconate Cloth  6 each Topical Q0600   Chlorhexidine Gluconate Cloth  6 each Topical Q0600   darbepoetin (ARANESP) injection - DIALYSIS  60 mcg Subcutaneous Q Fri-1800   doxercalciferol  4 mcg Intravenous Q M,W,F-HD   dronabinol  5 mg Oral BID AC   feeding supplement  237 mL Oral TID BM   ferrous sulfate  325  mg Oral BID WC   heparin  5,000 Units Subcutaneous Q8H   insulin aspart  0-6 Units Subcutaneous TID WC   lanthanum  1,000 mg Oral TID WC   midodrine  10 mg Oral TID WC   pantoprazole  40 mg Oral QHS   polycarbophil  1,250 mg Oral BID   sodium hypochlorite   Irrigation BID   Continuous Infusions:   LOS: 22 days   Rhetta Mura, MD [facility] P10/04/2023, 6:47 PM

## 2023-05-22 NOTE — Progress Notes (Signed)
[facility] Progress Note   Subjective:   Patient seen and examined at bedside.  Denies CP, SOB, abdominal pain and n/v/d. Irritated again today, asked "when the hell can I get out of here."   Objective Vitals:   05/21/23 2229 05/22/23 0415 05/22/23 0927 05/22/23 1211  BP: 92/63 (!) 94/58 (!) 92/56 (!) 85/60  Pulse:  84 92   Resp: 20 16 16 15   Temp: 98.2 F (36.8 C) 98.2 F (36.8 C) 97.8 F (36.6 C) 98.6 F (37 C)  TempSrc: Oral Oral Oral Oral  SpO2:   90%   Weight:  48 kg     Physical Exam General:chronically ill appearing female in NAD Heart:RRR, no mrg Lungs:CTAB, nml WOB on RA Abdomen:soft, ostomy in place Extremities:b/l AKA, no LE edema Dialysis Access: RU AVF +b/t   Filed Weights   05/20/23 1142 05/20/23 1603 05/22/23 0415  Weight: 49 kg 49.3 kg 48 kg    Intake/Output Summary (Last 24 hours) at 05/22/2023 1347 Last data filed at 05/21/2023 2112 Gross per 24 hour  Intake 100 ml  Output --  Net 100 ml    Additional Objective Labs: Basic Metabolic Panel: Recent Labs  Lab 05/19/23 0334 05/21/23 0356 05/22/23 0450  NA 129* 132* 133*  K 4.9 4.0 4.3  CL 90* 92* 93*  CO2 23 24 24   GLUCOSE 247* 92 120*  BUN 22* 17 28*  CREATININE 5.15* 3.80* 5.18*  CALCIUM 9.3 9.2 9.9   CBC:  Recent Labs    05/20/23 1844  IRON 68  TIBC 111*   Lab Results  Component Value Date   INR 1.9 (H) 05/14/2023   INR 1.3 (H) 04/30/2023   INR 1.3 (H) 06/28/2022    Medications:   Chlorhexidine Gluconate Cloth  6 each Topical Q0600   Chlorhexidine Gluconate Cloth  6 each Topical Q0600   darbepoetin (ARANESP) injection - DIALYSIS  60 mcg Subcutaneous Q Fri-1800   doxercalciferol  4 mcg Intravenous Q M,W,F-HD   dronabinol  5 mg Oral BID AC   feeding supplement  237 mL Oral TID BM   ferrous sulfate  325 mg Oral BID WC   heparin  5,000 Units Subcutaneous Q8H   insulin aspart  0-6 Units Subcutaneous TID WC   lanthanum  1,000 mg Oral TID WC   midodrine  10  mg Oral TID WC   pantoprazole  40 mg Oral QHS   polycarbophil  1,250 mg Oral BID   sodium hypochlorite   Irrigation BID    Dialysis Orders: [facility] MWF  3h  400/1.5  49kg   R AVF  Hep none - last OP HD 9/16, post wt 51kg, coming off 2- 4kg over the last 3 wks - mircera IV q 2 wks, last 9/13, due 9/27 - hectorol 4 mcg IV three times per week.    Assessment/Plan: 1. 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 Hillery Hunter on 05/04/23. Surgery following. Palliative care consulting.  2. Sepsis 2/2 ischemic colitis - completed IV ABX on 9/28. Per PMD.  3. ESRD - on MWF.  Continue HD per regular schedule. No UF with HD.  4. HD access - Unable to cannulate AV fistula on 9/27 because of pulling clots.  IR placed temporary HD catheter and pt received dialysis on 9/27.  Duplex US of AVF showed low blood flow, vascular surgeon and IR were consulted.  Pt went for fistulogram 10/1 by IR. 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 This area was marked and we are not to stick within the circular marking. The rest of the AVF is good to stick; did well w/ HD on 10/7.  5. Anemia of CKD- Hgb 7.6. Continue ESA. Check iron panel. Transfuse prn for Hgb<7. 6. Secondary hyperparathyroidism - Ca at goal.  Senispar on hold d/t GI issues.  Continue VDRA.  Last Phos 5.1, change binders from sevelamer d/t SBO to Fosrenol.  7. HTN/volume - BP soft, on midodrine.  Does not appear volume overloaded. Significant GI losses during admission.  Did not tolerate volume removal last week.  Plan to run even for now.  8. Nutrition - Renal diet w/fluid restrictions.    Virgina Norfolk, PA-C [facility] 05/22/2023,1:47 PM  LOS: 22 days

## 2023-05-22 NOTE — Progress Notes (Signed)
OT Cancellation Note  Patient Details Name: [name] MRN: [id number] DOB: 03-Mar-1981   Cancelled Treatment:    Reason Eval/Treat Not Completed: Patient declined, no reason specified (Pt declined participationin skilled OT session this day. Pt reported pain and discomfort in bed. OT offered to assist pt in repositioning with pt declining. OT to continue to follow pt acutely as appropriate/available.)  Rosanne Sack "Orson Eva., OTR/L, MA Acute Rehab [phone]   Lendon Colonel 05/22/2023, 3:42 PM

## 2023-05-22 NOTE — Progress Notes (Addendum)
Patient ID: Iverna Hammac, female   DOB: [birth year], 42 y.o.   MRN: [id number]    Progress Note from the Palliative Medicine Team at [facility]   Patient Name: Meya Clutter        Date: 05/22/2023 DOB: 1981/03/28  Age: 42 y.o. MRN#: [id number] Attending Physician: Rhetta Mura, MD Primary Care Physician: Inc, [facility] Admit Date: 04/30/2023   Reason for Consultation/Follow-up   Establishing Goals of Care   HPI/ Brief Hospital Review   42 year old female with history of type 1 diabetes with diabetic retinopathy/blindness, peripheral vascular disease status post bilateral BKA, ESRD on dialysis, hypertension, hyperlipidemia, CAD, mesenteric ischemia with chronic pneumatosis who initially presented from [facility] with complaint of acute onset abdominal pain, diarrhea.    On presentation, she was febrile, tachycardic.    CT abdomen/pelvis showed small bowel obstruction with dilated ileal segment up to 4 cm, sigmoid colitis, diffuse gastric wall thickening, cholelithiasis.    General surgery was consulted and was admitted for the management of small bowel obstruction.  Nephrology consulted for dialysis.  General surgery did  diagnostic laparoscopy which was converted to ex lap with extended ileocecectomy and end ileostomy on 05/04/2023.S/P IR fistulogram and balloon angioplasty 05/14/23.   Prolonged hospitalization.  Day 22.    Patient faces ongoing treatment option decisions, advanced directive decisions and anticipatory care needs.   Subjective  Extensive chart review has been completed prior to meeting with patient  including labs, vital signs, imaging, progress/consult notes, orders, medications and available advance directive documents.    This NP assessed patient at the bedside as a follow up at reconsult request By Dr Jonathon Bellows on 05-18-23.     A palliative medicine consult was completed on 05/08/2023 by Girard Cooter, NP for this admission  Patient  is a long-term skilled nursing facility patient/[facility]  Unfortunately patient continues to fail to thrive in spite of full medical support.      Patient was alert was not interested in any conversation with this nurse practitioner today.  I attempted to create space and opportunity for patient to share thoughts and feelings regarding current medical situation unsuccessfully.  Patient has been refusing participation in therapies and recommended nursing care.  Throughout this hospitalization in the previous hospitalization in May 2024 patient has consistently verbalized her desire for all offered and available medical interventions to prolong life.  I attempted to contact patient's H POA/ [name] Baker/sister and contact listed for Briant Cedar both unsuccessful and unable to leave voicemail.      Discussed with primary team and nursing staff.   Requested that if any family call or present at the bedside to gather pertinent contact information.   Time: 35   minutes  Detailed review of medical records ( labs, imaging, vital signs), medically appropriate exam ( MS, skin, cardiac,  resp)   discussed with treatment team, counseling and education to patient, family, staff, documenting clinical information, medication management, coordination of care    Lorinda Creed NP  Palliative Medicine Team Team Phone # [phone] Pager [phone]

## 2023-05-22 NOTE — Progress Notes (Signed)
Patient ID: [name], female   DOB: [birth year], 42 y.o.   MRN: [id number] [facility] Progress Note  18 Days Post-Op  Subjective: Patient states she is eating and her bag is working.  Refuses for me to examine her and wants me to leave her along.  Objective: Vital signs in last 24 hours: Temp:  [97.8 F (36.6 C)-98.6 F (37 C)] 97.8 F (36.6 C) (10/09 0927) Pulse Rate:  [84-92] 92 (10/09 0927) Resp:  [16-20] 16 (10/09 0927) BP: (85-106)/(56-71) 92/56 (10/09 0927) SpO2:  [90 %] 90 % (10/09 0927) Weight:  [48 kg] 48 kg (10/09 0415) Last BM Date : 05/21/23  Intake/Output from previous day: 10/08 0701 - 10/09 0700 In: 100 [P.O.:100] Out: -  Intake/Output this shift: No intake/output data recorded.  PE: General: NAD Lungs: Normal rate and effort Abd: unable to examine as patient refuses.   Lab Results:  Recent Labs    05/21/23 0356 05/22/23 0450  WBC 17.7* 14.9*  HGB 8.2* 7.6*  HCT 27.1* 25.5*  PLT 334 359   BMET Recent Labs    05/21/23 0356 05/22/23 0450  NA 132* 133*  K 4.0 4.3  CL 92* 93*  CO2 24 24  GLUCOSE 92 120*  BUN 17 28*  CREATININE 3.80* 5.18*  CALCIUM 9.2 9.9   PT/INR No results for input(s): "LABPROT", "INR" in the last 72 hours.  CMP     Component Value Date/Time   NA 133 (L) 05/22/2023 0450   NA 133 (L) 05/18/2020 0927   K 4.3 05/22/2023 0450   CL 93 (L) 05/22/2023 0450   CO2 24 05/22/2023 0450   GLUCOSE 120 (H) 05/22/2023 0450   BUN 28 (H) 05/22/2023 0450   BUN 45 (H) 05/18/2020 0927   CREATININE 5.18 (H) 05/22/2023 0450   CALCIUM 9.9 05/22/2023 0450   CALCIUM 12.1 (H) 11/13/2021 2026   PROT 6.2 (L) 05/01/2023 1020   PROT 7.6 05/18/2020 0927   ALBUMIN 1.9 (L) 05/15/2023 0844   ALBUMIN 3.9 05/18/2020 0927   AST 26 05/01/2023 1020   ALT 18 05/01/2023 1020   ALKPHOS 72 05/01/2023 1020   BILITOT 0.9 05/01/2023 1020   BILITOT <0.2 05/18/2020 0927   GFRNONAA 10 (L) 05/22/2023 0450   GFRAA 6 (L) 05/18/2020 0927    Lipase     Component Value Date/Time   LIPASE 40 12/20/2022 0918       Studies/Results: No results found.  Anti-infectives: Anti-infectives (From admission, onward)    Start     Dose/Rate Route Frequency Ordered Stop   05/08/23 2200  cefTRIAXone (ROCEPHIN) 2 g in sodium chloride 0.9 % 100 mL IVPB        2 g 200 mL/hr over 30 Minutes Intravenous Every 24 hours 05/08/23 1613 05/11/23 2232   05/08/23 2200  metroNIDAZOLE (FLAGYL) IVPB 500 mg        500 mg 100 mL/hr over 60 Minutes Intravenous Every 12 hours 05/08/23 1613 05/11/23 2346   05/04/23 0200  cefTRIAXone (ROCEPHIN) 2 g in sodium chloride 0.9 % 100 mL IVPB        2 g 200 mL/hr over 30 Minutes Intravenous Every 24 hours 05/04/23 0102 05/08/23 0144   05/04/23 0200  metroNIDAZOLE (FLAGYL) IVPB 500 mg        500 mg 100 mL/hr over 60 Minutes Intravenous Every 12 hours 05/04/23 0102 05/09/23 1203   04/30/23 1800  ciprofloxacin (CIPRO) IVPB 400 mg  Status:  Discontinued  400 mg 200 mL/hr over 60 Minutes Intravenous Every 24 hours 04/30/23 1443 05/02/23 1140   04/30/23 1515  metroNIDAZOLE (FLAGYL) IVPB 500 mg  Status:  Discontinued        500 mg 100 mL/hr over 60 Minutes Intravenous Every 12 hours 04/30/23 1418 05/02/23 1140        Assessment/Plan 42 yo female presenting with abdominal pain, diarrhea, low grade fevers, dilated small bowel and colonic wall thickening on CT   POD#18 s/p diagnostic lap converted to ex-lap with extended ileo-cecectomy (mid-ileum to just past cecum), end ileostomy 9/21 [name] - Intra-op findings were notable for adhesions in the RLQ w/ associated twisting of the mesentery resulting in SB ischemia. - CT 9/26 ordered due to leukocytosis but overall looks ok, some residual small bowel thickening but no large abscess. Off IV abx as of 9/28. - CT 10/5 for hypotension also overall looks okay. Some thickening of the colon wall and mild small-bowel dilatation. HDS improved. WBC  downtrending.  - Dakin's wet to dry dressings and then switch back to saline yesterday, consider replacing VAC this week, but patient refused for me to examine her at all today, so unclear what wound looks like. - Unclear ileostomy output as there is no documentation of output in the chart for several days. Strict I/O. Continue BID iron, BID fiber, and TID imodium. Titrate as needed.  - mobilize, continue therapies - rec SNF - Restart diet. On Marinol -seems surgically stable at this time.  Would like to see ileostomy output for 24 hr period and to see if we can get a VAC in place for her tomorrow, then hopefully she can go to SNF soon.   FEN:  Renal diet, Glucerna ID: Rocephin/Flagyl 9/21 > 9/28 VTE: SCDs, SQH q 8h Foley: out    LOS: 22 days   [name], [facility] 05/22/2023, 9:44 AM Please see Amion for pager number during day hours 7:00am-4:30pm

## 2023-05-23 DIAGNOSIS — K56609 Unspecified intestinal obstruction, unspecified as to partial versus complete obstruction: Secondary | ICD-10-CM | POA: Diagnosis not present

## 2023-05-23 LAB — GLUCOSE, CAPILLARY
Glucose-Capillary: 203 mg/dL — ABNORMAL HIGH (ref 70–99)
Glucose-Capillary: 70 mg/dL (ref 70–99)
Glucose-Capillary: 113 mg/dL — ABNORMAL HIGH (ref 70–99)
Glucose-Capillary: 102 mg/dL — ABNORMAL HIGH (ref 70–99)
Glucose-Capillary: 129 mg/dL — ABNORMAL HIGH (ref 70–99)
Glucose-Capillary: 140 mg/dL — ABNORMAL HIGH (ref 70–99)

## 2023-05-23 MED ORDER — ALBUMIN HUMAN 25 % IV SOLN
25.0000 g | Freq: Once | INTRAVENOUS | Status: AC
  Administered 2023-05-23: 25 g via INTRAVENOUS
  Filled 2023-05-23: qty 100

## 2023-05-23 MED ORDER — ASPIRIN 81 MG PO TBEC
81.0000 mg | DELAYED_RELEASE_TABLET | Freq: Every day | ORAL | Status: DC
  Administered 2023-05-23 – 2023-05-24 (×2): 81 mg via ORAL
  Filled 2023-05-23 (×2): qty 1

## 2023-05-23 MED ORDER — CHLORHEXIDINE GLUCONATE CLOTH 2 % EX PADS: 6.0000 | MEDICATED_PAD | Freq: Every day | CUTANEOUS | Status: DC

## 2023-05-23 MED ORDER — NEPRO/CARBSTEADY PO LIQD
237.0000 mL | Freq: Three times a day (TID) | ORAL | Status: DC
  Administered 2023-05-23 – 2023-05-24 (×3): 237 mL via ORAL

## 2023-05-23 MED ORDER — DEXTROSE 50 % IV SOLN
12.5000 g | INTRAVENOUS | Status: AC
  Administered 2023-05-23: 12.5 g via INTRAVENOUS
  Filled 2023-05-23: qty 50

## 2023-05-23 MED ORDER — ATORVASTATIN CALCIUM 40 MG PO TABS
40.0000 mg | ORAL_TABLET | Freq: Every day | ORAL | Status: DC
  Administered 2023-05-23 – 2023-05-24 (×2): 40 mg via ORAL
  Filled 2023-05-23 (×2): qty 1

## 2023-05-23 NOTE — Progress Notes (Signed)
   05/23/23 0130  Vitals  BP (!) 98/59  MAP (mmHg) 70  BP Location Left Arm  BP Method Automatic  Patient Position (if appropriate) Lying  Pulse Rate 80  ECG Heart Rate 84  Resp 11  Oxygen Therapy  SpO2 100 %  O2 Device Nasal Cannula  O2 Flow Rate (L/min) 3 L/min  During Treatment Monitoring  Blood Flow Rate (mL/min) 299 mL/min  Arterial Pressure (mmHg) -220.8 mmHg  Venous Pressure (mmHg) 219.59 mmHg  TMP (mmHg) 14.95 mmHg  Ultrafiltration Rate (mL/min) 487 mL/min  Dialysate Flow Rate (mL/min) 299 ml/min  Dialysate Potassium Concentration 3  Dialysate Calcium Concentration 2.5  Duration of HD Treatment -hour(s) 2.99 hour(s)  Cumulative Fluid Removed (mL) per Treatment  -202.87  Intra-Hemodialysis Comments Tx completed  Post Treatment  Dialyzer Clearance Lightly streaked  Hemodialysis Intake (mL) 200 mL  Liters Processed 54  Fluid Removed (mL) 200 mL  AVG/AVF Arterial Site Held (minutes) 10 minutes  AVG/AVF Venous Site Held (minutes) 10 minutes  Fistula / Graft Right Upper arm Arteriovenous fistula  Placement Date/Time: 05/15/23 0838   Placed prior to admission: Yes  Orientation: Right  Access Location: Upper arm  Access Type: Arteriovenous fistula  Site Condition No complications  Fistula / Graft Assessment Present  Hemodialysis Catheter Left Internal jugular Triple lumen Temporary (Non-Tunneled)  Placement Date/Time: 05/10/23 1450   Serial / Lot #: [id number]  Expiration Date: 08/11/27  Time Out: Correct patient;Correct site;Correct procedure  Maximum sterile barrier precautions: Hand hygiene;Cap;Mask;Sterile gown;Sterile gloves;Sterile probe ...  Red Lumen Status Saline locked  Purple Lumen Status Saline locked  Dressing Type Transparent  Dressing Status Clean, Dry, Intact  Drainage Description None  Post treatment catheter status Capped and Clamped   Pt experience low blood pressure during treatment . Albumin 25% administered per orders of Dr. Thedore Mins. No fluid  removal. Pt remain stable. Post treatment  B/P increase 98/59 mp 70 HR 80. Hand off to C. Parichat RN.

## 2023-05-23 NOTE — Progress Notes (Signed)
Patient ID: [name], female   DOB: 03-02-1981, 42 y.o.   MRN: [id number] [facility] Progress Note  19 Days Post-Op  Subjective: Patient states she is eating and denies abdominal pain this AM. Declined abdominal exam   Objective: Vital signs in last 24 hours: Temp:  [98 F (36.7 C)-98.6 F (37 C)] 98 F (36.7 C) (10/10 0743) Pulse Rate:  [76-89] 83 (10/10 0228) Resp:  [6-18] 17 (10/10 0743) BP: (64-107)/(38-71) 107/71 (10/10 0743) SpO2:  [92 %-100 %] 96 % (10/10 0228) Weight:  [52 kg] 52 kg (10/10 0718) Last BM Date : 05/23/23  Intake/Output from previous day: 10/09 0701 - 10/10 0700 In: 30 [P.O.:30] Out: 800 [Stool:600] Intake/Output this shift: No intake/output data recorded.  PE: General: NAD Lungs: Normal rate and effort Abd: unable to examine as patient refuses.   Lab Results:  Recent Labs    05/21/23 0356 05/22/23 0450  WBC 17.7* 14.9*  HGB 8.2* 7.6*  HCT 27.1* 25.5*  PLT 334 359   BMET Recent Labs    05/21/23 0356 05/22/23 0450  NA 132* 133*  K 4.0 4.3  CL 92* 93*  CO2 24 24  GLUCOSE 92 120*  BUN 17 28*  CREATININE 3.80* 5.18*  CALCIUM 9.2 9.9   PT/INR No results for input(s): "LABPROT", "INR" in the last 72 hours.  CMP     Component Value Date/Time   NA 133 (L) 05/22/2023 0450   NA 133 (L) 05/18/2020 0927   K 4.3 05/22/2023 0450   CL 93 (L) 05/22/2023 0450   CO2 24 05/22/2023 0450   GLUCOSE 120 (H) 05/22/2023 0450   BUN 28 (H) 05/22/2023 0450   BUN 45 (H) 05/18/2020 0927   CREATININE 5.18 (H) 05/22/2023 0450   CALCIUM 9.9 05/22/2023 0450   CALCIUM 12.1 (H) 11/13/2021 2026   PROT 6.2 (L) 05/01/2023 1020   PROT 7.6 05/18/2020 0927   ALBUMIN 1.9 (L) 05/15/2023 0844   ALBUMIN 3.9 05/18/2020 0927   AST 26 05/01/2023 1020   ALT 18 05/01/2023 1020   ALKPHOS 72 05/01/2023 1020   BILITOT 0.9 05/01/2023 1020   BILITOT <0.2 05/18/2020 0927   GFRNONAA 10 (L) 05/22/2023 0450   GFRAA 6 (L) 05/18/2020 0927   Lipase      Component Value Date/Time   LIPASE 40 12/20/2022 0918       Studies/Results: No results found.  Anti-infectives: Anti-infectives (From admission, onward)    Start     Dose/Rate Route Frequency Ordered Stop   05/08/23 2200  cefTRIAXone (ROCEPHIN) 2 g in sodium chloride 0.9 % 100 mL IVPB        2 g 200 mL/hr over 30 Minutes Intravenous Every 24 hours 05/08/23 1613 05/11/23 2232   05/08/23 2200  metroNIDAZOLE (FLAGYL) IVPB 500 mg        500 mg 100 mL/hr over 60 Minutes Intravenous Every 12 hours 05/08/23 1613 05/11/23 2346   05/04/23 0200  cefTRIAXone (ROCEPHIN) 2 g in sodium chloride 0.9 % 100 mL IVPB        2 g 200 mL/hr over 30 Minutes Intravenous Every 24 hours 05/04/23 0102 05/08/23 0144   05/04/23 0200  metroNIDAZOLE (FLAGYL) IVPB 500 mg        500 mg 100 mL/hr over 60 Minutes Intravenous Every 12 hours 05/04/23 0102 05/09/23 1203   04/30/23 1800  ciprofloxacin (CIPRO) IVPB 400 mg  Status:  Discontinued        400 mg 200 mL/hr over 60 Minutes  Intravenous Every 24 hours 04/30/23 1443 05/02/23 1140   04/30/23 1515  metroNIDAZOLE (FLAGYL) IVPB 500 mg  Status:  Discontinued        500 mg 100 mL/hr over 60 Minutes Intravenous Every 12 hours 04/30/23 1418 05/02/23 1140        Assessment/Plan 42 yo female presenting with abdominal pain, diarrhea, low grade fevers, dilated small bowel and colonic wall thickening on CT   POD#19 s/p diagnostic lap converted to ex-lap with extended ileo-cecectomy (mid-ileum to just past cecum), 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 Hillery Hunter - Intra-op findings were notable for adhesions in the RLQ w/ associated twisting of the mesentery resulting in SB ischemia. - CT 9/26 ordered due to leukocytosis but overall looks ok, some residual small bowel thickening but no large abscess. Off IV abx as of 9/28. - CT 10/5 for hypotension also overall looks okay. Some thickening of the colon wall and mild small-bowel dilatation. HDS improved.  - BID wet to dry dressing, ok  to put VAC back on if patient agreeable but will heal fine with wet to dry dressings as well - tolerating diet and ileostomy output <1200 mL for 24h, continue current fiber/iron/imodium regimen - stable for DC to SNF from surgery standpoint   FEN:  Renal diet, Glucerna ID: Rocephin/Flagyl 9/21 > 9/28 VTE: SCDs, SQH q 8h Foley: out    LOS: 23 days   Juliet Rude, [facility] 05/23/2023, 10:51 AM Please see Amion for pager number during day hours 7:00am-4:30pm

## 2023-05-23 NOTE — TOC Progression Note (Signed)
Transition of Care [facility]) - Progression Note    Patient Details  Name: [name] MRN: [id number] Date of Birth: [birth year]  [facility] CM/SW Contact  Carley Hammed, LCSW Phone Number: 05/23/2023, 2:55 PM  Clinical Narrative:    CSW advised that medical staff has been unable to contact pt's family. CSW attempted the numbers on the chart, both disconnected. CSW spoke with [facility] who provided the only number they had, it was also disconnected. CSW attempted to find other contacts in care everywhere, CSW was advised the number found no longer belonged to pt's family. Wadie Lessen states no barriers to returning at Dc. TOC will continue to follow.    Expected Discharge Plan: Long Term Nursing Home Barriers to Discharge: Continued Medical Work up  Expected Discharge Plan and Services                                               Social Determinants of Health (SDOH) Interventions SDOH Screenings   Food Insecurity: No Food Insecurity (04/30/2023)  Housing: Low Risk  (04/30/2023)  Transportation Needs: No Transportation Needs (04/30/2023)  Utilities: Not At Risk (04/30/2023)  Depression (PHQ2-9): Low Risk  (05/11/2020)  Tobacco Use: Low Risk  (04/30/2023)    Readmission Risk Interventions    07/04/2022   10:49 AM 08/23/2021    3:42 PM  Readmission Risk Prevention Plan  Transportation Screening Complete Complete  HRI or Home Care Consult  Complete  Social Work Consult for Recovery Care Planning/Counseling  Complete  Palliative Care Screening  Not Applicable  Medication Review Oceanographer) Complete Complete  PCP or Specialist appointment within 3-5 days of discharge Complete   HRI or Home Care Consult Complete   SW Recovery Care/Counseling Consult Complete   Palliative Care Screening Not Applicable   Skilled Nursing Facility Complete

## 2023-05-23 NOTE — Discharge Instructions (Signed)
[facility]      [facility], [place] [phone]  OPEN ABDOMINAL SURGERY: POST OP INSTRUCTIONS  Always review your discharge instruction sheet given to you by the facility where your surgery was performed.  IF YOU HAVE DISABILITY OR FAMILY LEAVE FORMS, YOU MUST BRING THEM TO THE OFFICE FOR PROCESSING.  PLEASE DO NOT GIVE THEM TO YOUR DOCTOR.  A prescription for pain medication may be given to you upon discharge.  Take your pain medication as prescribed, if needed.  If narcotic pain medicine is not needed, then you may take acetaminophen (Tylenol) or ibuprofen (Advil) as needed. Take your usually prescribed medications unless otherwise directed. If you need a refill on your pain medication, please contact your pharmacy. They will contact our office to request authorization.  Prescriptions will not be filled after 5pm or on week-ends. You should follow a light diet the first few days after arrival home, such as soup and crackers, pudding, etc.unless your doctor has advised otherwise. A high-fiber, low fat diet can be resumed as tolerated.   Be sure to include lots of fluids daily. Most patients will experience some swelling and bruising on the chest and neck area.  Ice packs will help.  Swelling and bruising can take several days to resolve Most patients will experience some swelling and bruising in the area of the incision. Ice pack will help. Swelling and bruising can take several days to resolve..  It is common to experience some constipation if taking pain medication after surgery.  Increasing fluid intake and taking a stool softener will usually help or prevent this problem from occurring.  A mild laxative (Milk of Magnesia or Miralax) should be taken according to package directions if there are no bowel movements after 48 hours.  You may have steri-strips (small skin tapes) in place directly over the incision.  These strips should be left on the skin for 7-10 days.  If your surgeon used skin  glue on the incision, you may shower in 24 hours.  The glue will flake off over the next 2-3 weeks.  Any sutures or staples will be removed at the office during your follow-up visit. You may find that a light gauze bandage over your incision may keep your staples from being rubbed or pulled. You may shower and replace the bandage daily. ACTIVITIES:  You may resume regular (light) daily activities beginning the next day--such as daily self-care, walking, climbing stairs--gradually increasing activities as tolerated.  You may have sexual intercourse when it is comfortable.  Refrain from any heavy lifting or straining until approved by your doctor. You may drive when you no longer are taking prescription pain medication, you can comfortably wear a seatbelt, and you can safely maneuver your car and apply brakes Return to Work: ___________________________________ Bonita Quin should see your doctor in the office for a follow-up appointment approximately two weeks after your surgery.  Make sure that you call for this appointment within a day or two after you arrive home to insure a convenient appointment time. OTHER INSTRUCTIONS:  _____________________________________________________________ _____________________________________________________________  WHEN TO CALL YOUR DOCTOR: Fever over 101.0 Inability to urinate Nausea and/or vomiting Extreme swelling or bruising Continued bleeding from incision. Increased pain, redness, or drainage from the incision. Difficulty swallowing or breathing Muscle cramping or spasms. Numbness or tingling in hands or feet or around lips.  The clinic staff is available to answer your questions during regular business hours.  Please don't hesitate to call and ask to speak to one of  the nurses if you have concerns.  For further questions, please visit [email or web address]   SURGERY: POST OP INSTRUCTIONS (Surgery for small bowel obstruction, colon resection,  etc)   ######################################################################  EAT Gradually transition to a high fiber diet with a fiber supplement over the next few days after discharge  WALK Walk an hour a day.  Control your pain to do that.    CONTROL PAIN Control pain so that you can walk, sleep, tolerate sneezing/coughing, go up/down stairs.  HAVE A BOWEL MOVEMENT DAILY Keep your bowels regular to avoid problems.  OK to try a laxative to override constipation.  OK to use an antidairrheal to slow down diarrhea.  Call if not better after 2 tries  CALL IF YOU HAVE PROBLEMS/CONCERNS Call if you are still struggling despite following these instructions. Call if you have concerns not answered by these instructions  ######################################################################   DIET Follow a light diet the first few days at home.  Start with a bland diet such as soups, liquids, starchy foods, low fat foods, etc.  If you feel full, bloated, or constipated, stay on a ful liquid or pureed/blenderized diet for a few days until you feel better and no longer constipated. Be sure to drink plenty of fluids every day to avoid getting dehydrated (feeling dizzy, not urinating, etc.). Gradually add a fiber supplement to your diet over the next week.  Gradually get back to a regular solid diet.  Avoid fast food or heavy meals the first week as you are more likely to get nauseated. It is expected for your digestive tract to need a few months to get back to normal.  It is common for your bowel movements and stools to be irregular.  You will have occasional bloating and cramping that should eventually fade away.  Until you are eating solid food normally, off all pain medications, and back to regular activities; your bowels will not be normal. Focus on eating a low-fat, high fiber diet the rest of your life (See Getting to Good Bowel Health, below).  CARE of your INCISION or  WOUND  [email or web address] @  If you have an open wound with a wound vac, see wound vac care instructions.    ACTIVITIES as tolerated Start light daily activities --- self-care, walking, climbing stairs-- beginning the day after surgery.  Gradually increase activities as tolerated.  Control your pain to be active.  Stop when you are tired.  Ideally, walk several times a day, eventually an hour a day.   Most people are back to most day-to-day activities in a few weeks.  It takes 4-8 weeks to get back to unrestricted, intense activity. If you can walk 30 minutes without difficulty, it is safe to try more intense activity such as jogging, treadmill, bicycling, low-impact aerobics, swimming, etc. Save the most intensive and strenuous activity for last (Usually 4-8 weeks after surgery) such as sit-ups, heavy lifting, contact sports, etc.  Refrain from any intense heavy lifting or straining until you are off narcotics for pain control.  You will have off days, but things should improve week-by-week. DO NOT PUSH THROUGH PAIN.  Let pain be your guide: If it hurts to do something, don't do it.  Pain is your body warning you to avoid that activity for another week until the pain goes down. You may drive when you are no longer taking narcotic prescription pain medication, you can comfortably wear a seatbelt, and you can safely make sudden turns/stops to protect yourself without hesitating due  to pain. You may have sexual intercourse when it is comfortable. If it hurts to do something, stop.   MEDICATIONS Take your usually prescribed home medications unless otherwise directed.   Blood thinners:  You can restart any strong blood thinners after the second postoperative day.  It is OK to continue aspirin before & after surgery..    Some blood in BMs the first 1-2 weeks is common but should taper down & be small volume.  If you are passing many large clots, call your surgeon    PAIN CONTROL Pain after  surgery or related to activity is often due to strain/injury to muscle, tendon, nerves and/or incisions.  This pain is usually short-term and will improve in a few months.  To help speed the process of healing and to get back to regular activity more quickly, DO THE FOLLOWING THINGS TOGETHER: Increase activity gradually.  DO NOT PUSH THROUGH PAIN Use Ice and/or Heat Try Gentle Massage and/or Stretching Take over the counter pain medication Take Narcotic prescription pain medication for more severe pain  Good pain control = faster recovery.  It is better to take more medicine to be more active than to stay in bed all day to avoid medications.  Increase activity gradually Avoid heavy lifting at first, then increase to lifting as tolerated over the next 6 weeks. Do not "push through" the pain.  Listen to your body and avoid positions and maneuvers than reproduce the pain.  Wait a few days before trying something more intense Walking an hour a day is encouraged to help your body recover faster and more safely.  Start slowly and stop when getting sore.  If you can walk 30 minutes without stopping or pain, you can try more intense activity (running, jogging, aerobics, cycling, swimming, treadmill, sex, sports, weightlifting, etc.) Remember: If it hurts to do it, then don't do it! Use Ice and/or Heat You will have swelling and bruising around the incisions.  This will take several weeks to resolve. Ice packs or heating pads (6-8 times a day, 30-60 minutes at a time) will help sooth soreness & bruising. Some people prefer to use ice alone, heat alone, or alternate between ice & heat.  Experiment and see what works best for you.  Consider trying ice for the first few days to help decrease swelling and bruising; then, switch to heat to help relax sore spots and speed recovery. Shower every day.  Short baths are fine.  It feels good!  Keep the incisions and wounds clean with soap & water.   Try Gentle  Massage and/or Stretching Massage at the area of pain many times a day Stop if you feel pain - do not overdo it Take over the counter pain medication This helps the muscle and nerve tissues become less irritable and calm down faster Choose ONE of the following over-the-counter anti-inflammatory medications: Acetaminophen 500mg  tabs (Tylenol) 1-2 pills with every meal and just before bedtime (avoid if you have liver problems or if you have acetaminophen in you narcotic prescription) Naproxen 220mg  tabs (ex. Aleve, Naprosyn) 1-2 pills twice a day (avoid if you have kidney, stomach, IBD, or bleeding problems) Ibuprofen 200mg  tabs (ex. Advil, Motrin) 3-4 pills with every meal and just before bedtime (avoid if you have kidney, stomach, IBD, or bleeding problems) Take with food/snack several times a day as directed for at least 2 weeks to help keep pain / soreness down & more manageable. Take Narcotic prescription pain medication for more severe  pain A prescription for strong pain control is often given to you upon discharge (for example: oxycodone/Percocet, hydrocodone/Norco/Vicodin, or tramadol/Ultram) Take your pain medication as prescribed. Be mindful that most narcotic prescriptions contain Tylenol (acetaminophen) as well - avoid taking too much Tylenol. If you are having problems/concerns with the prescription medicine (does not control pain, nausea, vomiting, rash, itching, etc.), please call us [phone] to see if we need to switch you to a different pain medicine that will work better for you and/or control your side effects better. If you need a refill on your pain medication, you must call the office before 4 pm and on weekdays only.  By federal law, prescriptions for narcotics cannot be called into a pharmacy.  They must be filled out on paper & picked up from our office by the patient or authorized caretaker.  Prescriptions cannot be filled after 4 pm nor on weekends.    WHEN TO CALL us  [phone] Severe uncontrolled or worsening pain  Fever over 101 F (38.5 C) Concerns with the incision: Worsening pain, redness, rash/hives, swelling, bleeding, or drainage Reactions / problems with new medications (itching, rash, hives, nausea, etc.) Nausea and/or vomiting Difficulty urinating Difficulty breathing Worsening fatigue, dizziness, lightheadedness, blurred vision Other concerns If you are not getting better after two weeks or are noticing you are getting worse, contact our office [phone] for further advice.  We may need to adjust your medications, re-evaluate you in the office, send you to the emergency room, or see what other things we can do to help. The clinic staff is available to answer your questions during regular business hours (8:30am-5pm).  Please don't hesitate to call and ask to speak to one of our nurses for clinical concerns.    A surgeon from [facility] is always on call at the hospitals 24 hours/day If you have a medical emergency, go to the nearest emergency room or call 911.  FOLLOW UP in our office One the day of your discharge from the hospital (or the next business weekday), please call [facility] to set up or confirm an appointment to see your surgeon in the office for a follow-up appointment.  Usually it is 2-3 weeks after your surgery.   If you have skin staples at your incision(s), let the office know so we can set up a time in the office for the nurse to remove them (usually around 10 days after surgery). Make sure that you call for appointments the day of discharge (or the next business weekday) from the hospital to ensure a convenient appointment time. IF YOU HAVE DISABILITY OR FAMILY LEAVE FORMS, BRING THEM TO THE OFFICE FOR PROCESSING.  DO NOT GIVE THEM TO YOUR DOCTOR.  [facility] [address], [place], [place]  [address] ? [phone] - Main [phone] - Toll Free,   [phone] - Fax [email or web address]    GETTING TO GOOD BOWEL HEALTH. It is expected for your digestive tract to need a few months to get back to normal.  It is common for your bowel movements and stools to be irregular.  You will have occasional bloating and cramping that should eventually fade away.  Until you are eating solid food normally, off all pain medications, and back to regular activities; your bowels will not be normal.   Avoiding constipation The goal: ONE SOFT BOWEL MOVEMENT A DAY!    Drink plenty of fluids.  Choose water first. TAKE A  FIBER SUPPLEMENT EVERY DAY THE REST OF YOUR LIFE During your first week back home, gradually add back a fiber supplement every day Experiment which form you can tolerate.   There are many forms such as powders, tablets, wafers, gummies, etc Psyllium bran (Metamucil), methylcellulose (Citrucel), Miralax or Glycolax, Benefiber, Flax Seed.  Adjust the dose week-by-week (1/2 dose/day to 6 doses a day) until you are moving your bowels 1-2 times a day.  Cut back the dose or try a different fiber product if it is giving you problems such as diarrhea or bloating. Sometimes a laxative is needed to help jump-start bowels if constipated until the fiber supplement can help regulate your bowels.  If you are tolerating eating & you are farting, it is okay to try a gentle laxative such as double dose MiraLax, prune juice, or Milk of Magnesia.  Avoid using laxatives too often. Stool softeners can sometimes help counteract the constipating effects of narcotic pain medicines.  It can also cause diarrhea, so avoid using for too long. If you are still constipated despite taking fiber daily, eating solids, and a few doses of laxatives, call our office. Controlling diarrhea Try drinking liquids and eating bland foods for a few days to avoid stressing your intestines further. Avoid dairy products (especially milk & ice cream) for a short time.  The intestines  often can lose the ability to digest lactose when stressed. Avoid foods that cause gassiness or bloating.  Typical foods include beans and other legumes, cabbage, broccoli, and dairy foods.  Avoid greasy, spicy, fast foods.  Every person has some sensitivity to other foods, so listen to your body and avoid those foods that trigger problems for you. Probiotics (such as active yogurt, Align, etc) may help repopulate the intestines and colon with normal bacteria and calm down a sensitive digestive tract Adding a fiber supplement gradually can help thicken stools by absorbing excess fluid and retrain the intestines to act more normally.  Slowly increase the dose over a few weeks.  Too much fiber too soon can backfire and cause cramping & bloating. It is okay to try and slow down diarrhea with a few doses of antidiarrheal medicines.   Bismuth subsalicylate (ex. Kayopectate, Pepto Bismol) for a few doses can help control diarrhea.  Avoid if pregnant.   Loperamide (Imodium) can slow down diarrhea.  Start with one tablet (2mg ) first.  Avoid if you are having fevers or severe pain.  ILEOSTOMY PATIENTS WILL HAVE CHRONIC DIARRHEA since their colon is not in use.    Drink plenty of liquids.  You will need to drink even more glasses of water/liquid a day to avoid getting dehydrated. Record output from your ileostomy.  Expect to empty the bag every 3-4 hours at first.  Most people with a permanent ileostomy empty their bag 4-6 times at the least.   Use antidiarrheal medicine (especially Imodium) several times a day to avoid getting dehydrated.  Start with a dose at bedtime & breakfast.  Adjust up or down as needed.  Increase antidiarrheal medications as directed to avoid emptying the bag more than 8 times a day (every 3 hours). Work with your wound ostomy nurse to learn care for your ostomy.  See ostomy care instructions. TROUBLESHOOTING IRREGULAR BOWELS 1) Start with a soft & bland diet. No spicy, greasy, or fried  foods.  2) Avoid gluten/wheat or dairy products from diet to see if symptoms improve. 3) Miralax 17gm or flax seed mixed in 8oz. water or juice-daily.  May use 2-4 times a day as needed. 4) Gas-X, Phazyme, etc. as needed for gas & bloating.  5) Prilosec (omeprazole) over-the-counter as needed 6)  Consider probiotics (Align, Activa, etc) to help calm the bowels down  Call your doctor if you are getting worse or not getting better.  Sometimes further testing (cultures, endoscopy, X-ray studies, CT scans, bloodwork, etc.) may be needed to help diagnose and treat the cause of the diarrhea. [facility] [address], [place], [place]  [address] [phone] - Main.    [phone]  - Toll Free.   [phone] - Fax [email or web address]

## 2023-05-23 NOTE — Progress Notes (Signed)
[facility] Progress Note   Subjective:   Patient seen and examined at bedside. Tolerated breakfast.  Requesting hamburger for lunch. No specific complaints.   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, no edema Dialysis Access: RU AVF +b/t   Filed Weights   05/20/23 1603 05/22/23 0415 05/23/23 0718  Weight: 49.3 kg 48 kg 52 kg    Intake/Output Summary (Last 24 hours) at 05/23/2023 1218 Last data filed at 05/23/2023 0315 Gross per 24 hour  Intake 30 ml  Output 800 ml  Net -770 ml    Additional Objective Labs: Basic Metabolic Panel: Recent Labs  Lab 05/19/23 0334 05/21/23 0356 05/22/23 0450  NA 129* 132* 133*  K 4.9 4.0 4.3  CL 90* 92* 93*  CO2 23 24 24   GLUCOSE 247* 92 120*  BUN 22* 17 28*  CREATININE 5.15* 3.80* 5.18*  CALCIUM 9.3 9.2 9.9   CBC: Recent Labs  Lab 05/17/23 0556 05/18/23 1800 05/19/23 0334 05/21/23 0356 05/22/23 0450  WBC 14.2* 20.5* 17.6* 17.7* 14.9*  HGB 7.9* 7.8* 7.4* 8.2* 7.6*  HCT 26.8* 26.1* 24.7* 27.1* 25.5*  MCV 87.3 87.6 86.4 86.6 87.3  PLT 338 342 332 334 359   Iron Studies:  Recent Labs    05/20/23 1844  IRON 68  TIBC 111*   Lab Results  Component Value Date   INR 1.9 (H) 05/14/2023   INR 1.3 (H) 04/30/2023   INR 1.3 (H) 06/28/2022   Studies/Results: No results found.  Medications:   Chlorhexidine Gluconate Cloth  6 each Topical Q0600   Chlorhexidine Gluconate Cloth  6 each Topical Q0600   darbepoetin (ARANESP) injection - DIALYSIS  60 mcg Subcutaneous Q Fri-1800   doxercalciferol  4 mcg Intravenous Q M,W,F-HD   dronabinol  5 mg Oral BID AC   feeding  supplement (NEPRO CARB STEADY)  237 mL Oral TID BM   ferrous sulfate  325 mg Oral BID WC   heparin  5,000 Units Subcutaneous Q8H   insulin aspart  0-6 Units Subcutaneous TID WC   lanthanum  1,000 mg Oral TID WC   midodrine  10 mg Oral TID WC   pantoprazole  40 mg Oral QHS   polycarbophil  1,250 mg Oral BID    Dialysis Orders: [facility] MWF  3h  400/1.5  49kg   R AVF  Hep none - last OP HD 9/16, post wt 51kg, coming off 2- 4kg over the last 3 wks - mircera IV q 2 wks, last 9/13, due 9/27 - hectorol 4 mcg IV three times per week.    Assessment/Plan: 1. 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 Hillery Hunter on 05/04/23. Surgery following. Palliative care consulting.  2. Sepsis 2/2 ischemic colitis - completed IV ABX on 9/28. Per PMD.  3. ESRD - on MWF.  Continue HD per regular schedule. No UF with HD.  4. HD access - Unable to cannulate AV fistula on 9/27 because of pulling clots.  IR placed temporary HD catheter and pt  received dialysis on 9/27.  Duplex US of AVF showed low blood flow, vascular surgeon and IR were consulted.  Pt went for fistulogram 10/1 by IR. There was a R cephalic stenosis treated w/ venoplasty, and a central stenosis also rx'd w/ venoplasty. 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 This area was marked and we are not to stick within the circular marking. The rest of the AVF is good to stick; did well w/ HD on 10/7.  5. Anemia of CKD- Hgb 7.6. Continue ESA. Tsat in goal. Transfuse prn for Hgb<7. 6. Secondary hyperparathyroidism - Ca at goal.  Senispar on hold d/t GI issues.  Continue VDRA.  Last Phos 5.1, change binders from sevelamer d/t SBO to Fosrenol.  7. HTN/volume - BP soft, on midodrine.  Does not appear volume overloaded. Significant GI losses during admission.  Did not tolerate volume removal last week.  Plan to run even for now.  8. Nutrition -  Renal diet w/fluid restrictions.  Virgina Norfolk, PA-C [facility] 05/23/2023,12:18 PM  LOS: 23 days

## 2023-05-23 NOTE — Progress Notes (Signed)
PT Cancellation Note  Patient Details Name: Libby Goehring MRN: [id number] DOB: 05-10-1981   Cancelled Treatment:    Reason Eval/Treat Not Completed: Patient declined, no reason specified. PT will continue to follow.   Donna Bernard, PT, MPT  Ina Homes 05/23/2023, 12:49 PM

## 2023-05-23 NOTE — Progress Notes (Signed)
Occupational Therapy Treatment Patient Details Name: [name] MRN: [id number] DOB: 06/02/1981 Today's Date: 05/23/2023   History of present illness Pt is a 42 y/o F presenting to ED on 9/17 from [facility] with abdominal pain, found to have SBO. S/p laparoscopy, extended ileocecectomy on 9/21. PMH includes bilat AKA, ESRD on HD MWF, HTN, HLD, anemia, CVA, GBS, mesenteric ischemia wiht colonic pneumatosis   OT comments  Pt not feeling well due to pain and general feeling of being unwell. Pt requires max encouragement for participate. Mobility assisted with treatment. Pt refused OOB or EOB activities, was able to roll L/R with max encouragement mod A using rails, has back pain with rolling. Pt able to sit up full in bed for 10 seconds with mod A x2 HHA. Pt requires mod A for toileting hygiene at bed level. Pt would benefit greatly from continued skilled therapy to maximize participation and strength, DC plan back to postacute still appropriate.       If plan is discharge home, recommend the following:  Two people to help with walking and/or transfers;A lot of help with bathing/dressing/bathroom;Assistance with feeding;Assistance with cooking/housework;Direct supervision/assist for medications management;Direct supervision/assist for financial management;Assist for transportation;Help with stairs or ramp for entrance   Equipment Recommendations   (defer)    Recommendations for Other Services      Precautions / Restrictions Precautions Precautions: Fall Precaution Comments: low vision, bilat AKA Restrictions Weight Bearing Restrictions: No       Mobility Bed Mobility Overal bed mobility: Needs Assistance Bed Mobility: Rolling, Supine to Sit Rolling: Mod assist, Used rails   Supine to sit: Max assist, +2 for physical assistance     General bed mobility comments: Pt mod A for rolling L/R and sit to supine in bed. Pt has significant pain with rolling    Transfers Overall  transfer level: Needs assistance                 General transfer comment: refused     Balance Overall balance assessment: Needs assistance Sitting-balance support: Bilateral upper extremity supported Sitting balance-Leahy Scale: Poor Sitting balance - Comments: sitting in bed with B hand support, posterior lean, max A for support, significant pain                                   ADL either performed or assessed with clinical judgement   ADL Overall ADL's : Needs assistance/impaired     Grooming: Minimal assistance;Bed level       Lower Body Bathing: Maximal assistance;Bed level       Lower Body Dressing: Moderate assistance;Bed level       Toileting- Clothing Manipulation and Hygiene: Moderate assistance;Bed level         General ADL Comments: Pt completed toileting at bed level, mod A for hygiene, able to wipe but not effectively. Pt mod A for rolling L/R to clean    Extremity/Trunk Assessment Upper Extremity Assessment Upper Extremity Assessment: Generalized weakness            Vision       Perception     Praxis      Cognition Arousal: Alert Behavior During Therapy: Flat affect, Anxious Overall Cognitive Status: No family/caregiver present to determine baseline cognitive functioning  General Comments: becomes frusterated at times, self limiting, low motivation to participate        Exercises      Shoulder Instructions       General Comments      Pertinent Vitals/ Pain       Pain Assessment Pain Assessment: Faces Faces Pain Scale: Hurts even more Pain Location: low back Pain Descriptors / Indicators: Discomfort, Grimacing Pain Intervention(s): Monitored during session  Home Living                                          Prior Functioning/Environment              Frequency  Min 1X/week        Progress Toward Goals  OT Goals(current  goals can now be found in the care plan section)  Progress towards OT goals: Progressing toward goals  Acute Rehab OT Goals Patient Stated Goal: to return home OT Goal Formulation: With patient Time For Goal Achievement: 06/06/23 Potential to Achieve Goals: Good ADL Goals Pt Will Perform Eating: with supervision;sitting;bed level Pt Will Perform Grooming: with supervision;sitting;bed level Pt/caregiver will Perform Home Exercise Program: Both right and left upper extremity;With written HEP provided;With Supervision Additional ADL Goal #1: pt will perform bed mobility min A in prep for bed level/seated ADLs  Plan      Co-evaluation                 AM-PAC OT "6 Clicks" Daily Activity     Outcome Measure   Help from another person eating meals?: A Little Help from another person taking care of personal grooming?: A Little Help from another person toileting, which includes using toliet, bedpan, or urinal?: A Lot Help from another person bathing (including washing, rinsing, drying)?: A Lot Help from another person to put on and taking off regular upper body clothing?: A Lot Help from another person to put on and taking off regular lower body clothing?: A Lot 6 Click Score: 14    End of Session    OT Visit Diagnosis: Muscle weakness (generalized) (M62.81);Low vision, both eyes (H54.2)   Activity Tolerance Patient limited by pain   Patient Left in bed;with call bell/phone within reach;with nursing/sitter in room   Nurse Communication Mobility status        Time: 4098-1191 OT Time Calculation (min): 28 min  Charges: OT General Charges $OT Visit: 1 Visit OT Treatments $Self Care/Home Management : 23-37 mins  [place], OTR/L   Saphira Lahmann R Haizlee Henton 05/23/2023, 3:50 PM

## 2023-05-23 NOTE — Progress Notes (Signed)
Nutrition Follow-up  DOCUMENTATION CODES:   Non-severe (moderate) malnutrition in context of chronic illness  INTERVENTION:   Liberalize diet to regular with 1200 ml fluid restriction, this will allow more options to help improve intake.  Change supplement to Nepro Shake po TID, each supplement provides 425 kcal and 19 grams protein. This supplement contains more calories and protein than Ensure.   If intake does not improve, recommend Cortrak placement for supplemental TF.   NUTRITION DIAGNOSIS:   Moderate Malnutrition related to chronic illness as evidenced by energy intake < or equal to 50% for > or equal to 5 days, moderate fat depletion, moderate muscle depletion.  Ongoing   GOAL:   Patient will meet greater than or equal to 90% of their needs  Progressing   MONITOR:   PO intake, Supplement acceptance, Weight trends, Labs  REASON FOR ASSESSMENT:   Consult Poor PO  ASSESSMENT:   42 y.o. female with past medical history significant for type 1 diabetes mellitus with diabetic retinopathy, PVD s/p bilateral BKA, ESRD on HD MWF, HTN, HLD, history of CVA, history of chronic anemia, GBS, mesenteric ischemia with colonic pneumatosis May 2024 treated nonoperatively. Admitted 9/17 from SNF with SOB. Poor intake, small bowel obstruction, and colitis.  Patient is currently on a renal diet with 1200 ml fluid restriction. Meal intakes: 0-25%, average intake for the past 6 meals documented is 21%. She has had poor intake since admission. She has been refusing to drink the Ensure supplements as well.  Palliative Care team is following and patient wants all offered and available medical interventions to prolong life.   Per discussion with RN, patient does not like the renal diet restrictions. She ate ~25% of breakfast today. A different nurse gave her the Ensure this morning, so RN unsure if she drank it yet. Unable to speak with patient at this time, no answer when room number called.  Per nursing documentation, patient has been refusing the Ensure supplements when offered.   Labs reviewed. Na 133 (10/9), phos 5.1 (10/2) CBG: [phone]  Medications reviewed and include Aranesp, Hectorol, Marinol, ferrous sulfate, novoLOG SSI TID with meals, Fosrenol, Fibercon.  Diet Order:   Diet Order             Diet renal with fluid restriction Fluid restriction: 1200 mL Fluid; Room service appropriate? Yes with Assist; Fluid consistency: Thin  Diet effective now                   EDUCATION NEEDS:   Not appropriate for education at this time  Skin:  Skin Assessment: Skin Integrity Issues: Skin Integrity Issues:: Stage II, Incisions Stage II: 9/17 sacrum Incisions: 9/21 abdomen  Last BM:  10/10 600 ml output from ileostomy x 24 hours  Height:   Ht Readings from Last 1 Encounters:  03/17/23 5\' 1"  (1.549 m)    Weight:   Wt Readings from Last 1 Encounters:  05/23/23 52 kg    BMI:  Body mass index is 21.66 kg/m.  Estimated Nutritional Needs:   Kcal:  1700-2000 kcal  Protein:  80-90 g  Fluid:  1 L + UOP   Gabriel Rainwater RD, LDN, CNSC Please refer to Amion for contact information.

## 2023-05-23 NOTE — Plan of Care (Signed)
  Problem: Tissue Perfusion: Goal: Adequacy of tissue perfusion will improve Outcome: Progressing   Problem: Pain Managment: Goal: General experience of comfort will improve Outcome: Progressing   Problem: Safety: Goal: Ability to remain free from injury will improve Outcome: Progressing

## 2023-05-23 NOTE — Progress Notes (Signed)
PROGRESS NOTE  [name]  ZOX:[id number] DOB: 27-Aug-1980 DOA: 04/30/2023 PCP: [facility]   Brief Narrative: 42 year old [place] [facility] resident , type 1 diabetes with diabetic retinopathy/blindness, peripheral vascular disease status post bilateral BKA, ESRD on dialysis, hypertension, hyperlipidemia, CAD, mesenteric ischemia with chronic pneumatosis who initially presented with complaint of acute onset abdominal pain, diarrhea.  Septic on arrival  CT abdomen/pelvis showed small bowel obstruction with dilated ileal segment up to 4 cm, sigmoid colitis, diffuse gastric wall thickening, cholelithiasis.   General surgery was consulted and was admitted for the management of small bowel obstruction.   Nephrology consulted for dialysis.   General surgery did  diagnostic laparoscopy which was converted to ex lap with extended ileocecectomy and end ileostomy on 05/04/2023. S/P IR fistulogram and balloon angioplasty 05/14/23.  Prolonged hospitalization--- 10/5 leukocytosis increasing pain CT abdomen pelvis diffuse colitis no concerns for abscess or worsening  Assessment & Plan:  Principal Problem:   SBO (small bowel obstruction) (HCC) Active Problems:   Ischemic disease of gut (HCC)   Protein-calorie malnutrition, moderate (HCC)  Partial SBO: Did not respond to conservative management, IV antibiotics.  General surgery diagnostic laparoscopy which was converted to ex lap with extended ileocecectomy and end ileostomy on 05/04/2023.  Antibiotics discontinued and wound VAC removed on 9/28. General surgery following. CT abd/pelvis on 10/ 5 as above General surgery changed the dressing on 10/7.  Has ileostomy with output. Decline and a detailed exam of wound today continue dressings by nursing with with Dakin's solution?  VAC if she allows examination-continue constipating agent such as twice daily iron fiber and 3 times daily Imodium have been started General Surgery feel patient  is stable for discharge as ostomy output is less than 1200  ESRD on dialysis: Nephrology following.  AV fistula angioplasty performed 10/1 --can use fistula according to nephrology--- has tunneled dialysis accessin left chest Defer to nephrology further management continues iron , Aranesp 60 q. Friday, Hectorol MWF Check phosphorus next labs  Metabolic bone disease of CKD: Continue sevelamer, Sensipar-dsevelamewr to fosrenal 1000 tid  Anemia of chronic disease: Hemoglobin in the range of 7.  Continue to monitor.  Associated with CKD. Last iron level 68, saturation show 62 does not require iron replacement may require ESA  Hypotension: Discontinue this admission sosorbide mononitrate Midodrine initiated on 10/5  Peripheral vascular disease/hyperlipidemia: Status post bilateral BKA  Leukocytosis: Has mild leukocytosis.  She is off antibiotics for now.  Continue to monitor  Loose incisor teeth: Dental medicine unavailable at this hospital unfortunately  Goals of care: Palliative care following for goals of care.  She is full code-she wishes to go back to facility  Pressure Injury 04/30/23 Sacrum Mid Stage 2 -  Partial thickness loss of dermis presenting as a shallow open injury with a red, pink wound bed without slough. 5 cm x 7 cm pink dry (Active)  04/30/23   Location: Sacrum  Location Orientation: Mid  Staging: Stage 2 -  Partial thickness loss of dermis presenting as a shallow open injury with a red, pink wound bed without slough.  Wound Description (Comments): 5 cm x 7 cm pink dry  Present on Admission: Yes     Nutrition Problem: Moderate Malnutrition Etiology: chronic illness Pressure Injury 04/30/23 Sacrum Mid Stage 2 -  Partial thickness loss of dermis presenting as a shallow open injury with a red, pink wound bed without slough. 5 cm x 7 cm pink dry (Active)  04/30/23   Location: Sacrum  Location Orientation: Mid  Staging: Stage 2 -  Partial thickness loss of dermis  presenting as a shallow open injury with a red, pink wound bed without slough.  Wound Description (Comments): 5 cm x 7 cm pink dry  Present on Admission: Yes  Dressing Type Foam - Lift dressing to assess site every shift 05/23/23 0745    DVT prophylaxis:heparin injection 5,000 Units Start: 04/30/23 0900     Code Status: Full Code  Family Communication: None   Patient status:Inpatient  Patient is from :SNF  Anticipated discharge to:SNF  Estimated DC date:after general surgery clearance   Consultants: General Surgery, vascular surgery, nephrology  Procedures: As above  Antimicrobials:  Anti-infectives (From admission, onward)    Start     Dose/Rate Route Frequency Ordered Stop   05/08/23 2200  cefTRIAXone (ROCEPHIN) 2 g in sodium chloride 0.9 % 100 mL IVPB        2 g 200 mL/hr over 30 Minutes Intravenous Every 24 hours 05/08/23 1613 05/11/23 2232   05/08/23 2200  metroNIDAZOLE (FLAGYL) IVPB 500 mg        500 mg 100 mL/hr over 60 Minutes Intravenous Every 12 hours 05/08/23 1613 05/11/23 2346   05/04/23 0200  cefTRIAXone (ROCEPHIN) 2 g in sodium chloride 0.9 % 100 mL IVPB        2 g 200 mL/hr over 30 Minutes Intravenous Every 24 hours 05/04/23 0102 05/08/23 0144   05/04/23 0200  metroNIDAZOLE (FLAGYL) IVPB 500 mg        500 mg 100 mL/hr over 60 Minutes Intravenous Every 12 hours 05/04/23 0102 05/09/23 1203   04/30/23 1800  ciprofloxacin (CIPRO) IVPB 400 mg  Status:  Discontinued        400 mg 200 mL/hr over 60 Minutes Intravenous Every 24 hours 04/30/23 1443 05/02/23 1140   04/30/23 1515  metroNIDAZOLE (FLAGYL) IVPB 500 mg  Status:  Discontinued        500 mg 100 mL/hr over 60 Minutes Intravenous Every 12 hours 04/30/23 1418 05/02/23 1140       Subjective:  Continues to decline abdominal exam Asked me to reposition her which we did Has a happy eating hamburger at the bedside No pain No fever    Objective: Vitals:   05/23/23 0228 05/23/23 0718 05/23/23 0743  05/23/23 1545  BP: 96/64  107/71 119/71  Pulse: 83   86  Resp: 16  17 18   Temp: 98.1 F (36.7 C)  98 F (36.7 C) 98.4 F (36.9 C)  TempSrc: Oral   Oral  SpO2: 96%  100% 100%  Weight:  52 kg      Intake/Output Summary (Last 24 hours) at 05/23/2023 1557 Last data filed at 05/23/2023 1407 Gross per 24 hour  Intake 40 ml  Output 725 ml  Net -685 ml   Filed Weights   05/20/23 1603 05/22/23 0415 05/23/23 0718  Weight: 49.3 kg 48 kg 52 kg    Examination:  Coherent maintains eyes closed Chest is clear S1-S2 Tachycardia Did not examine under bandages-ostomy seems to be functioning well right lower quadrant with semiformed stool No lower extremity edema  Data Reviewed: I have personally reviewed following labs and imaging studies  CBC: Recent Labs  Lab 05/17/23 0556 05/18/23 1800 05/19/23 0334 05/21/23 0356 05/22/23 0450  WBC 14.2* 20.5* 17.6* 17.7* 14.9*  HGB 7.9* 7.8* 7.4* 8.2* 7.6*  HCT 26.8* 26.1* 24.7* 27.1* 25.5*  MCV 87.3 87.6 86.4 86.6 87.3  PLT 338 342 332 334 359  Basic Metabolic Panel: Recent Labs  Lab 05/17/23 0556 05/18/23 1800 05/19/23 0334 05/21/23 0356 05/22/23 0450  NA 135 128* 129* 132* 133*  K 4.6 5.2* 4.9 4.0 4.3  CL 91* 89* 90* 92* 93*  CO2 29 19* 23 24 24   GLUCOSE 90 191* 247* 92 120*  BUN 19 20 22* 17 28*  CREATININE 4.91* 4.81* 5.15* 3.80* 5.18*  CALCIUM 9.2 9.0 9.3 9.2 9.9     No results found for this or any previous visit (from the past 240 hour(s)).   Radiology Studies: No results found.  Scheduled Meds:  aspirin EC  81 mg Oral Daily   atorvastatin  40 mg Oral Daily   Chlorhexidine Gluconate Cloth  6 each Topical Q0600   Chlorhexidine Gluconate Cloth  6 each Topical Q0600   [START ON 05/24/2023] Chlorhexidine Gluconate Cloth  6 each Topical Q0600   darbepoetin (ARANESP) injection - DIALYSIS  60 mcg Subcutaneous Q Fri-1800   doxercalciferol  4 mcg Intravenous Q M,W,F-HD   dronabinol  5 mg Oral BID AC   feeding  supplement (NEPRO CARB STEADY)  237 mL Oral TID BM   ferrous sulfate  325 mg Oral BID WC   heparin  5,000 Units Subcutaneous Q8H   insulin aspart  0-6 Units Subcutaneous TID WC   lanthanum  1,000 mg Oral TID WC   midodrine  10 mg Oral TID WC   pantoprazole  40 mg Oral QHS   polycarbophil  1,250 mg Oral BID   Continuous Infusions:   LOS: 23 days   Rhetta Mura, MD [facility] P10/05/2023, 3:57 PM

## 2023-05-23 NOTE — Plan of Care (Signed)
  Problem: Metabolic: Goal: Ability to maintain appropriate glucose levels will improve Outcome: Progressing   Problem: Nutritional: Goal: Maintenance of adequate nutrition will improve Outcome: Progressing   Problem: Tissue Perfusion: Goal: Adequacy of tissue perfusion will improve Outcome: Progressing   Problem: Clinical Measurements: Goal: Respiratory complications will improve Outcome: Progressing   Problem: Nutrition: Goal: Adequate nutrition will be maintained Outcome: Progressing   Problem: Pain Managment: Goal: General experience of comfort will improve Outcome: Progressing

## 2023-05-24 DIAGNOSIS — K56609 Unspecified intestinal obstruction, unspecified as to partial versus complete obstruction: Secondary | ICD-10-CM | POA: Diagnosis not present

## 2023-05-24 LAB — CBC WITH DIFFERENTIAL/PLATELET
Abs Immature Granulocytes: 0.07 10*3/uL (ref 0.00–0.07)
Basophils Absolute: 0.1 10*3/uL (ref 0.0–0.1)
Basophils Relative: 1 %
Eosinophils Absolute: 0.6 10*3/uL — ABNORMAL HIGH (ref 0.0–0.5)
Eosinophils Relative: 5 %
HCT: 31.1 % — ABNORMAL LOW (ref 36.0–46.0)
Hemoglobin: 9.4 g/dL — ABNORMAL LOW (ref 12.0–15.0)
Immature Granulocytes: 1 %
Lymphocytes Relative: 17 %
Lymphs Abs: 1.7 10*3/uL (ref 0.7–4.0)
MCH: 26.9 pg (ref 26.0–34.0)
MCHC: 30.2 g/dL (ref 30.0–36.0)
MCV: 89.1 fL (ref 80.0–100.0)
Monocytes Absolute: 0.9 10*3/uL (ref 0.1–1.0)
Monocytes Relative: 9 %
Neutro Abs: 7.2 10*3/uL (ref 1.7–7.7)
Neutrophils Relative %: 67 %
Platelets: 334 10*3/uL (ref 150–400)
RBC: 3.49 MIL/uL — ABNORMAL LOW (ref 3.87–5.11)
RDW: 19.9 % — ABNORMAL HIGH (ref 11.5–15.5)
WBC: 10.5 10*3/uL (ref 4.0–10.5)
nRBC: 0.6 % — ABNORMAL HIGH (ref 0.0–0.2)

## 2023-05-24 LAB — RENAL FUNCTION PANEL
Albumin: 2.4 g/dL — ABNORMAL LOW (ref 3.5–5.0)
Anion gap: 14 (ref 5–15)
BUN: 20 mg/dL (ref 6–20)
CO2: 25 mmol/L (ref 22–32)
Calcium: 10.8 mg/dL — ABNORMAL HIGH (ref 8.9–10.3)
Chloride: 94 mmol/L — ABNORMAL LOW (ref 98–111)
Creatinine, Ser: 4.22 mg/dL — ABNORMAL HIGH (ref 0.44–1.00)
GFR, Estimated: 13 mL/min — ABNORMAL LOW (ref >60)
Glucose, Bld: 175 mg/dL — ABNORMAL HIGH (ref 70–99)
Phosphorus: 5.1 mg/dL — ABNORMAL HIGH (ref 2.5–4.6)
Potassium: 4.2 mmol/L (ref 3.5–5.1)
Sodium: 133 mmol/L — ABNORMAL LOW (ref 135–145)

## 2023-05-24 LAB — GLUCOSE, CAPILLARY
Glucose-Capillary: 162 mg/dL — ABNORMAL HIGH (ref 70–99)
Glucose-Capillary: 170 mg/dL — ABNORMAL HIGH (ref 70–99)

## 2023-05-24 MED ORDER — LANTHANUM CARBONATE 1000 MG PO CHEW: 1000.0000 mg | CHEWABLE_TABLET | Freq: Three times a day (TID) | ORAL | Status: DC

## 2023-05-24 MED ORDER — ALTEPLASE 2 MG IJ SOLR: 2.0000 mg | Freq: Once | INTRAMUSCULAR | Status: DC | PRN

## 2023-05-24 MED ORDER — MIDODRINE HCL 10 MG PO TABS: 10.0000 mg | ORAL_TABLET | Freq: Three times a day (TID) | ORAL | Status: DC

## 2023-05-24 MED ORDER — HEPARIN SODIUM (PORCINE) 1000 UNIT/ML DIALYSIS: 1000.0000 [IU] | INTRAMUSCULAR | Status: DC | PRN

## 2023-05-24 MED ORDER — LIDOCAINE-PRILOCAINE 2.5-2.5 % EX CREA: 1.0000 | TOPICAL_CREAM | CUTANEOUS | Status: DC | PRN

## 2023-05-24 MED ORDER — HYDROCODONE-ACETAMINOPHEN 5-325 MG PO TABS: 1.0000 | ORAL_TABLET | Freq: Four times a day (QID) | ORAL | 0 refills | Status: DC | PRN

## 2023-05-24 MED ORDER — LIDOCAINE HCL (PF) 1 % IJ SOLN: 5.0000 mL | INTRAMUSCULAR | Status: DC | PRN

## 2023-05-24 MED ORDER — DOXERCALCIFEROL 4 MCG/2ML IV SOLN: 4.0000 ug | INTRAVENOUS | Status: DC

## 2023-05-24 MED ORDER — PENTAFLUOROPROP-TETRAFLUOROETH EX AERO: 1.0000 | INHALATION_SPRAY | CUTANEOUS | Status: DC | PRN

## 2023-05-24 MED ORDER — DRONABINOL 5 MG PO CAPS: 5.0000 mg | ORAL_CAPSULE | Freq: Two times a day (BID) | ORAL | Status: DC

## 2023-05-24 MED ORDER — ALBUMIN HUMAN 25 % IV SOLN
25.0000 g | Freq: Once | INTRAVENOUS | Status: AC
  Administered 2023-05-24: 25 g via INTRAVENOUS
  Filled 2023-05-24: qty 100

## 2023-05-24 MED ORDER — ANTICOAGULANT SODIUM CITRATE 4% (200MG/5ML) IV SOLN: 5.0000 mL | Status: DC | PRN

## 2023-05-24 MED ORDER — CALCIUM POLYCARBOPHIL 625 MG PO TABS: 1250.0000 mg | ORAL_TABLET | Freq: Two times a day (BID) | ORAL | Status: DC

## 2023-05-24 MED ORDER — LOPERAMIDE HCL 2 MG PO CAPS: 2.0000 mg | ORAL_CAPSULE | Freq: Four times a day (QID) | ORAL | Status: DC | PRN

## 2023-05-24 NOTE — Plan of Care (Signed)
  Problem: Coping: Goal: Ability to adjust to condition or change in health will improve Outcome: Progressing   Problem: Fluid Volume: Goal: Ability to maintain a balanced intake and output will improve Outcome: Progressing   Problem: Health Behavior/Discharge Planning: Goal: Ability to identify and utilize available resources and services will improve Outcome: Progressing Goal: Ability to manage health-related needs will improve Outcome: Progressing   Problem: Metabolic: Goal: Ability to maintain appropriate glucose levels will improve Outcome: Progressing   Problem: Nutritional: Goal: Maintenance of adequate nutrition will improve Outcome: Progressing Goal: Progress toward achieving an optimal weight will improve Outcome: Progressing   Problem: Skin Integrity: Goal: Risk for impaired skin integrity will decrease Outcome: Progressing   Problem: Tissue Perfusion: Goal: Adequacy of tissue perfusion will improve Outcome: Progressing   Problem: Education: Goal: Knowledge of General Education information will improve Description: Including pain rating scale, medication(s)/side effects and non-pharmacologic comfort measures Outcome: Progressing   Problem: Health Behavior/Discharge Planning: Goal: Ability to manage health-related needs will improve Outcome: Progressing   Problem: Clinical Measurements: Goal: Ability to maintain clinical measurements within normal limits will improve Outcome: Progressing Goal: Will remain free from infection Outcome: Progressing Goal: Diagnostic test results will improve Outcome: Progressing Goal: Respiratory complications will improve Outcome: Progressing Goal: Cardiovascular complication will be avoided Outcome: Progressing   Problem: Activity: Goal: Risk for activity intolerance will decrease Outcome: Progressing   Problem: Nutrition: Goal: Adequate nutrition will be maintained Outcome: Progressing   Problem: Coping: Goal:  Level of anxiety will decrease Outcome: Progressing   Problem: Elimination: Goal: Will not experience complications related to bowel motility Outcome: Progressing Goal: Will not experience complications related to urinary retention Outcome: Progressing   Problem: Pain Managment: Goal: General experience of comfort will improve Outcome: Progressing   Problem: Safety: Goal: Ability to remain free from injury will improve Outcome: Progressing   Problem: Skin Integrity: Goal: Risk for impaired skin integrity will decrease Outcome: Progressing   Problem: Education: Goal: Knowledge of disease and its progression will improve Outcome: Progressing   Problem: Fluid Volume: Goal: Compliance with measures to maintain balanced fluid volume will improve Outcome: Progressing   Problem: Health Behavior/Discharge Planning: Goal: Ability to manage health-related needs will improve Outcome: Progressing   Problem: Nutritional: Goal: Ability to make healthy dietary choices will improve Outcome: Progressing   Problem: Clinical Measurements: Goal: Complications related to the disease process, condition or treatment will be avoided or minimized Outcome: Progressing

## 2023-05-24 NOTE — Progress Notes (Signed)
Patient is surgically stable for DC to SNF when bed available.  Follow up has been arranged for patient and prescription printed and placed on her chart for narcotics.    [name] 7:52 AM 05/24/2023

## 2023-05-24 NOTE — TOC Transition Note (Addendum)
Transition of Care [facility]) - CM/SW Discharge Note   Patient Details  Name: [name] MRN: [id number] Date of Birth: 03-06-1981  Transition of Care [facility]) CM/SW Contact:  Michaela Corner, LCSWA Phone Number: 05/24/2023, 1:06 PM   Clinical Narrative:  Pt to go to [facility] via [place] [facility]. Nurse to call report [phone].     Final next level of care: Skilled Nursing Facility Barriers to Discharge: Barriers Resolved   Patient Goals and CMS Choice      Discharge Placement                Patient chooses bed at: Other - please specify in the comment section below: [facility]) Patient to be transferred to facility by: [facility] Name of family member notified: Family unable to be notified Patient and family notified of of transfer: 05/24/23  Discharge Plan and Services Additional resources added to the After Visit Summary for                                       Social Determinants of Health (SDOH) Interventions SDOH Screenings   Food Insecurity: No Food Insecurity (04/30/2023)  Housing: Low Risk  (04/30/2023)  Transportation Needs: No Transportation Needs (04/30/2023)  Utilities: Not At Risk (04/30/2023)  Depression (PHQ2-9): Low Risk  (05/11/2020)  Tobacco Use: Low Risk  (04/30/2023)     Readmission Risk Interventions    07/04/2022   10:49 AM 08/23/2021    3:42 PM  Readmission Risk Prevention Plan  Transportation Screening Complete Complete  HRI or Home Care Consult  Complete  Social Work Consult for Recovery Care Planning/Counseling  Complete  Palliative Care Screening  Not Applicable  Medication Review Oceanographer) Complete Complete  PCP or Specialist appointment within 3-5 days of discharge Complete   HRI or Home Care Consult Complete   SW Recovery Care/Counseling Consult Complete   Palliative Care Screening Not Applicable   Skilled Nursing Facility Complete

## 2023-05-24 NOTE — Discharge Summary (Addendum)
10 mm balloon, there was markedly improved flow in the right innominate vein. The flow in the right subclavian vein also improved but there are residual densities in the right subclavian vein which may represent calcifications. Residual collateral flow in the right axilla and right lower neck at the end of the procedure. Overall, the flow in the central veins was improved at the end of the procedure. IMPRESSION: 1. Right upper extremity brachiocephalic fistula is patent. Areas of stenosis involving the proximal cephalic vein, right subclavian vein and right innominate vein were successfully treated with balloon angioplasty. 2. 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 This aneurysm is partially thrombosed. Suspect this area is being cannulated during dialysis which is causing problems. This aneurysm area was marked on the skin and recommend avoiding this area for dialysis cannulation. Recommend surgical consultation to evaluate this aneurysm formation. ACCESS: This access remains amenable to future percutaneous interventions as clinically indicated. Electronically Signed   By: Richarda Overlie M.D.   On: 05/14/2023 17:37   VAS US DUPLEX DIALYSIS ACCESS (AVF, AVG)  Result Date: 05/13/2023 DIALYSIS ACCESS Patient Name:  [name]  Date of Exam:   05/11/2023 Medical Rec #: [id number]      Accession #:    [id number] Date of Birth: 1981/05/15       Patient Gender: F Patient Age:   42 years Exam Location:  [facility] Procedure:      VAS US DUPLEX DIALYSIS ACCESS (AVF, AVG) Referring Phys: Crista Elliot  --------------------------------------------------------------------------------  Reason for Exam: Unable to dialyze through AVF/AVG. Access Site: Right Upper Extremity. Access Type: Brachial-cephalic AVF. History: HX of RUE AVF revision 10/23/21 (aneurysm complication & ulcerated          skin). Limitations: Calcification Comparison Study: Prior right fistula duplex done 03/17/23 Performing Technologist: Sherren Kerns RVS  Examination Guidelines: A complete evaluation includes B-mode imaging, spectral Doppler, color Doppler, and power Doppler as needed of all accessible portions of each vessel. Unilateral testing is considered an integral part of a complete examination. Limited examinations for reoccurring indications may be performed as noted.  +------------+----------+-------------+----------+--------+ OUTFLOW VEINPSV (cm/s)Diameter (cm)Depth (cm)Describe +------------+----------+-------------+----------+--------+ Clavicle       255                                    +------------+----------+-------------+----------+--------+ Shoulder        57                                    +------------+----------+-------------+----------+--------+ Prox UA         56                                    +------------+----------+-------------+----------+--------+ Mid UA          82                                    +------------+----------+-------------+----------+--------+ Dist UA        101                                    +------------+----------+-------------+----------+--------+ [facility]  Physician Discharge Summary  [name] NWG:[id number] DOB: Aug 10, 1981 DOA: 04/30/2023  PCP: [facility]  Admit date: 04/30/2023 Discharge date: 05/24/2023  Time spent: 46 minutes  Recommendations for Outpatient Follow-up:  Recommend CBC renal panel 2 to 3 days iron studies in 1 week to 3 weeks Needs extensive education about midline wound may need a VAC outpatient follow-up with surgery-patient noncompliant with some dressings in the hospital--- please see below narrative for supplies for back/wound dressings Please have dentist follow-up with the patient in the outpatient setting-patient needs extraction of front tooth-we do not have a dentist here  Discharge Diagnoses:  MAIN problem for hospitalization   SBO with ex lap extended ileocecectomy 9/21 Dr. Hillery Hunter complicated by small bowel ischemia ESRD on HD MWF Balloon angioplasty fistula 05/14/2023 metabolic bone disease Hypotension responsive to midodrine needs to be on this Loose incisor teeth  Please see below for itemized issues addressed in HOpsital- refer to other progress notes for clarity if needed  Discharge Condition: Overall quite guarded-her conditions are life limiting  Diet recommendation: Renal fluid restricted  Filed Weights   05/24/23 0402 05/24/23 1045 05/24/23 1057  Weight: 49 kg 21.8 kg 48 kg    History of present illness:  42 year old [place] resident , type 1 diabetes with diabetic retinopathy/blindness, peripheral vascular disease status post bilateral BKA, ESRD on dialysis, hypertension, hyperlipidemia, CAD, mesenteric ischemia with chronic pneumatosis who initially presented with complaint of acute onset abdominal pain, diarrhea.  Septic on arrival  CT abdomen/pelvis showed small bowel obstruction with dilated ileal segment up to 4 cm, sigmoid colitis, diffuse gastric wall thickening, cholelithiasis.   General surgery was consulted and was admitted for the management of  small bowel obstruction.   Nephrology consulted for dialysis.   General surgery did  diagnostic laparoscopy which was converted to ex lap with extended ileocecectomy and end ileostomy on 05/04/2023. S/P IR fistulogram and balloon angioplasty 05/14/23.  Prolonged hospitalization--- 10/5 leukocytosis increasing pain CT abdomen pelvis diffuse colitis no concerns for abscess or worsening    Hospital Course:  Partial SBO on admission not responsive to conservative therapy Ex lap 9/21 with extended ileocecectomy complication of ischemic bowel She completed antibiotics and wound VAC 9/28-she had intermittent issues with leukocytosis on 9/26 and had some hypotension on 10/5 repeat CTs did not show any other issues however when she has been doing fair with wet-to-dry dressings General surgery prescribed pain meds, iron fiber and 3 times daily Imodium which should be continued to bulk up the stools Wound can be cleaned by nursing with Lisette Grinder solution She will need follow-up with surgery in the outpatient setting  ESRD MWF AV fistula angioplasty 10/1-fistula is being used currently Tunneled HD catheter likely needs to be removed as per nephrology in the outpatient setting Continues on iron and (Hectorol MWF Overall was stable  Hypotension at dialysis Midodrine was increased to 10 3 times daily instead of the usual 5 3 times a week and this probably will need to continue given high output from the ostomy which is now decreasing  Anemia of renal disease metabolic bone disease Continues on Sensipar and was placed on Phosrenal instead of sevelamer  Saturation ratios reasonable iron level normal may require ESA as an outpatient setting  Loose incisor teeth Needs dental medicine appointment-had leukocytosis which resolved on its own-she will need outpatient follow-up for this  Medical noncompliance-she was disinterested in looking at her wounds despite imaging on several times by this examiner and general  10 mm balloon, there was markedly improved flow in the right innominate vein. The flow in the right subclavian vein also improved but there are residual densities in the right subclavian vein which may represent calcifications. Residual collateral flow in the right axilla and right lower neck at the end of the procedure. Overall, the flow in the central veins was improved at the end of the procedure. IMPRESSION: 1. Right upper extremity brachiocephalic fistula is patent. Areas of stenosis involving the proximal cephalic vein, right subclavian vein and right innominate vein were successfully treated with balloon angioplasty. 2. 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 This aneurysm is partially thrombosed. Suspect this area is being cannulated during dialysis which is causing problems. This aneurysm area was marked on the skin and recommend avoiding this area for dialysis cannulation. Recommend surgical consultation to evaluate this aneurysm formation. ACCESS: This access remains amenable to future percutaneous interventions as clinically indicated. Electronically Signed   By: Richarda Overlie M.D.   On: 05/14/2023 17:37   VAS US DUPLEX DIALYSIS ACCESS (AVF, AVG)  Result Date: 05/13/2023 DIALYSIS ACCESS Patient Name:  [name]  Date of Exam:   05/11/2023 Medical Rec #: [id number]      Accession #:    [id number] Date of Birth: 1981/05/15       Patient Gender: F Patient Age:   42 years Exam Location:  [facility] Procedure:      VAS US DUPLEX DIALYSIS ACCESS (AVF, AVG) Referring Phys: Crista Elliot  --------------------------------------------------------------------------------  Reason for Exam: Unable to dialyze through AVF/AVG. Access Site: Right Upper Extremity. Access Type: Brachial-cephalic AVF. History: HX of RUE AVF revision 10/23/21 (aneurysm complication & ulcerated          skin). Limitations: Calcification Comparison Study: Prior right fistula duplex done 03/17/23 Performing Technologist: Sherren Kerns RVS  Examination Guidelines: A complete evaluation includes B-mode imaging, spectral Doppler, color Doppler, and power Doppler as needed of all accessible portions of each vessel. Unilateral testing is considered an integral part of a complete examination. Limited examinations for reoccurring indications may be performed as noted.  +------------+----------+-------------+----------+--------+ OUTFLOW VEINPSV (cm/s)Diameter (cm)Depth (cm)Describe +------------+----------+-------------+----------+--------+ Clavicle       255                                    +------------+----------+-------------+----------+--------+ Shoulder        57                                    +------------+----------+-------------+----------+--------+ Prox UA         56                                    +------------+----------+-------------+----------+--------+ Mid UA          82                                    +------------+----------+-------------+----------+--------+ Dist UA        101                                    +------------+----------+-------------+----------+--------+ [facility]  Physician Discharge Summary  [name] NWG:[id number] DOB: Aug 10, 1981 DOA: 04/30/2023  PCP: [facility]  Admit date: 04/30/2023 Discharge date: 05/24/2023  Time spent: 46 minutes  Recommendations for Outpatient Follow-up:  Recommend CBC renal panel 2 to 3 days iron studies in 1 week to 3 weeks Needs extensive education about midline wound may need a VAC outpatient follow-up with surgery-patient noncompliant with some dressings in the hospital--- please see below narrative for supplies for back/wound dressings Please have dentist follow-up with the patient in the outpatient setting-patient needs extraction of front tooth-we do not have a dentist here  Discharge Diagnoses:  MAIN problem for hospitalization   SBO with ex lap extended ileocecectomy 9/21 Dr. Hillery Hunter complicated by small bowel ischemia ESRD on HD MWF Balloon angioplasty fistula 05/14/2023 metabolic bone disease Hypotension responsive to midodrine needs to be on this Loose incisor teeth  Please see below for itemized issues addressed in HOpsital- refer to other progress notes for clarity if needed  Discharge Condition: Overall quite guarded-her conditions are life limiting  Diet recommendation: Renal fluid restricted  Filed Weights   05/24/23 0402 05/24/23 1045 05/24/23 1057  Weight: 49 kg 21.8 kg 48 kg    History of present illness:  42 year old [place] resident , type 1 diabetes with diabetic retinopathy/blindness, peripheral vascular disease status post bilateral BKA, ESRD on dialysis, hypertension, hyperlipidemia, CAD, mesenteric ischemia with chronic pneumatosis who initially presented with complaint of acute onset abdominal pain, diarrhea.  Septic on arrival  CT abdomen/pelvis showed small bowel obstruction with dilated ileal segment up to 4 cm, sigmoid colitis, diffuse gastric wall thickening, cholelithiasis.   General surgery was consulted and was admitted for the management of  small bowel obstruction.   Nephrology consulted for dialysis.   General surgery did  diagnostic laparoscopy which was converted to ex lap with extended ileocecectomy and end ileostomy on 05/04/2023. S/P IR fistulogram and balloon angioplasty 05/14/23.  Prolonged hospitalization--- 10/5 leukocytosis increasing pain CT abdomen pelvis diffuse colitis no concerns for abscess or worsening    Hospital Course:  Partial SBO on admission not responsive to conservative therapy Ex lap 9/21 with extended ileocecectomy complication of ischemic bowel She completed antibiotics and wound VAC 9/28-she had intermittent issues with leukocytosis on 9/26 and had some hypotension on 10/5 repeat CTs did not show any other issues however when she has been doing fair with wet-to-dry dressings General surgery prescribed pain meds, iron fiber and 3 times daily Imodium which should be continued to bulk up the stools Wound can be cleaned by nursing with Lisette Grinder solution She will need follow-up with surgery in the outpatient setting  ESRD MWF AV fistula angioplasty 10/1-fistula is being used currently Tunneled HD catheter likely needs to be removed as per nephrology in the outpatient setting Continues on iron and (Hectorol MWF Overall was stable  Hypotension at dialysis Midodrine was increased to 10 3 times daily instead of the usual 5 3 times a week and this probably will need to continue given high output from the ostomy which is now decreasing  Anemia of renal disease metabolic bone disease Continues on Sensipar and was placed on Phosrenal instead of sevelamer  Saturation ratios reasonable iron level normal may require ESA as an outpatient setting  Loose incisor teeth Needs dental medicine appointment-had leukocytosis which resolved on its own-she will need outpatient follow-up for this  Medical noncompliance-she was disinterested in looking at her wounds despite imaging on several times by this examiner and general  10 mm balloon, there was markedly improved flow in the right innominate vein. The flow in the right subclavian vein also improved but there are residual densities in the right subclavian vein which may represent calcifications. Residual collateral flow in the right axilla and right lower neck at the end of the procedure. Overall, the flow in the central veins was improved at the end of the procedure. IMPRESSION: 1. Right upper extremity brachiocephalic fistula is patent. Areas of stenosis involving the proximal cephalic vein, right subclavian vein and right innominate vein were successfully treated with balloon angioplasty. 2. 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 This aneurysm is partially thrombosed. Suspect this area is being cannulated during dialysis which is causing problems. This aneurysm area was marked on the skin and recommend avoiding this area for dialysis cannulation. Recommend surgical consultation to evaluate this aneurysm formation. ACCESS: This access remains amenable to future percutaneous interventions as clinically indicated. Electronically Signed   By: Richarda Overlie M.D.   On: 05/14/2023 17:37   VAS US DUPLEX DIALYSIS ACCESS (AVF, AVG)  Result Date: 05/13/2023 DIALYSIS ACCESS Patient Name:  [name]  Date of Exam:   05/11/2023 Medical Rec #: [id number]      Accession #:    [id number] Date of Birth: 1981/05/15       Patient Gender: F Patient Age:   42 years Exam Location:  [facility] Procedure:      VAS US DUPLEX DIALYSIS ACCESS (AVF, AVG) Referring Phys: Crista Elliot  --------------------------------------------------------------------------------  Reason for Exam: Unable to dialyze through AVF/AVG. Access Site: Right Upper Extremity. Access Type: Brachial-cephalic AVF. History: HX of RUE AVF revision 10/23/21 (aneurysm complication & ulcerated          skin). Limitations: Calcification Comparison Study: Prior right fistula duplex done 03/17/23 Performing Technologist: Sherren Kerns RVS  Examination Guidelines: A complete evaluation includes B-mode imaging, spectral Doppler, color Doppler, and power Doppler as needed of all accessible portions of each vessel. Unilateral testing is considered an integral part of a complete examination. Limited examinations for reoccurring indications may be performed as noted.  +------------+----------+-------------+----------+--------+ OUTFLOW VEINPSV (cm/s)Diameter (cm)Depth (cm)Describe +------------+----------+-------------+----------+--------+ Clavicle       255                                    +------------+----------+-------------+----------+--------+ Shoulder        57                                    +------------+----------+-------------+----------+--------+ Prox UA         56                                    +------------+----------+-------------+----------+--------+ Mid UA          82                                    +------------+----------+-------------+----------+--------+ Dist UA        101                                    +------------+----------+-------------+----------+--------+ [facility]  Physician Discharge Summary  [name] NWG:[id number] DOB: Aug 10, 1981 DOA: 04/30/2023  PCP: [facility]  Admit date: 04/30/2023 Discharge date: 05/24/2023  Time spent: 46 minutes  Recommendations for Outpatient Follow-up:  Recommend CBC renal panel 2 to 3 days iron studies in 1 week to 3 weeks Needs extensive education about midline wound may need a VAC outpatient follow-up with surgery-patient noncompliant with some dressings in the hospital--- please see below narrative for supplies for back/wound dressings Please have dentist follow-up with the patient in the outpatient setting-patient needs extraction of front tooth-we do not have a dentist here  Discharge Diagnoses:  MAIN problem for hospitalization   SBO with ex lap extended ileocecectomy 9/21 Dr. Hillery Hunter complicated by small bowel ischemia ESRD on HD MWF Balloon angioplasty fistula 05/14/2023 metabolic bone disease Hypotension responsive to midodrine needs to be on this Loose incisor teeth  Please see below for itemized issues addressed in HOpsital- refer to other progress notes for clarity if needed  Discharge Condition: Overall quite guarded-her conditions are life limiting  Diet recommendation: Renal fluid restricted  Filed Weights   05/24/23 0402 05/24/23 1045 05/24/23 1057  Weight: 49 kg 21.8 kg 48 kg    History of present illness:  42 year old [place] resident , type 1 diabetes with diabetic retinopathy/blindness, peripheral vascular disease status post bilateral BKA, ESRD on dialysis, hypertension, hyperlipidemia, CAD, mesenteric ischemia with chronic pneumatosis who initially presented with complaint of acute onset abdominal pain, diarrhea.  Septic on arrival  CT abdomen/pelvis showed small bowel obstruction with dilated ileal segment up to 4 cm, sigmoid colitis, diffuse gastric wall thickening, cholelithiasis.   General surgery was consulted and was admitted for the management of  small bowel obstruction.   Nephrology consulted for dialysis.   General surgery did  diagnostic laparoscopy which was converted to ex lap with extended ileocecectomy and end ileostomy on 05/04/2023. S/P IR fistulogram and balloon angioplasty 05/14/23.  Prolonged hospitalization--- 10/5 leukocytosis increasing pain CT abdomen pelvis diffuse colitis no concerns for abscess or worsening    Hospital Course:  Partial SBO on admission not responsive to conservative therapy Ex lap 9/21 with extended ileocecectomy complication of ischemic bowel She completed antibiotics and wound VAC 9/28-she had intermittent issues with leukocytosis on 9/26 and had some hypotension on 10/5 repeat CTs did not show any other issues however when she has been doing fair with wet-to-dry dressings General surgery prescribed pain meds, iron fiber and 3 times daily Imodium which should be continued to bulk up the stools Wound can be cleaned by nursing with Lisette Grinder solution She will need follow-up with surgery in the outpatient setting  ESRD MWF AV fistula angioplasty 10/1-fistula is being used currently Tunneled HD catheter likely needs to be removed as per nephrology in the outpatient setting Continues on iron and (Hectorol MWF Overall was stable  Hypotension at dialysis Midodrine was increased to 10 3 times daily instead of the usual 5 3 times a week and this probably will need to continue given high output from the ostomy which is now decreasing  Anemia of renal disease metabolic bone disease Continues on Sensipar and was placed on Phosrenal instead of sevelamer  Saturation ratios reasonable iron level normal may require ESA as an outpatient setting  Loose incisor teeth Needs dental medicine appointment-had leukocytosis which resolved on its own-she will need outpatient follow-up for this  Medical noncompliance-she was disinterested in looking at her wounds despite imaging on several times by this examiner and general  10 mm balloon, there was markedly improved flow in the right innominate vein. The flow in the right subclavian vein also improved but there are residual densities in the right subclavian vein which may represent calcifications. Residual collateral flow in the right axilla and right lower neck at the end of the procedure. Overall, the flow in the central veins was improved at the end of the procedure. IMPRESSION: 1. Right upper extremity brachiocephalic fistula is patent. Areas of stenosis involving the proximal cephalic vein, right subclavian vein and right innominate vein were successfully treated with balloon angioplasty. 2. 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 This aneurysm is partially thrombosed. Suspect this area is being cannulated during dialysis which is causing problems. This aneurysm area was marked on the skin and recommend avoiding this area for dialysis cannulation. Recommend surgical consultation to evaluate this aneurysm formation. ACCESS: This access remains amenable to future percutaneous interventions as clinically indicated. Electronically Signed   By: Richarda Overlie M.D.   On: 05/14/2023 17:37   VAS US DUPLEX DIALYSIS ACCESS (AVF, AVG)  Result Date: 05/13/2023 DIALYSIS ACCESS Patient Name:  [name]  Date of Exam:   05/11/2023 Medical Rec #: [id number]      Accession #:    [id number] Date of Birth: 1981/05/15       Patient Gender: F Patient Age:   42 years Exam Location:  [facility] Procedure:      VAS US DUPLEX DIALYSIS ACCESS (AVF, AVG) Referring Phys: Crista Elliot  --------------------------------------------------------------------------------  Reason for Exam: Unable to dialyze through AVF/AVG. Access Site: Right Upper Extremity. Access Type: Brachial-cephalic AVF. History: HX of RUE AVF revision 10/23/21 (aneurysm complication & ulcerated          skin). Limitations: Calcification Comparison Study: Prior right fistula duplex done 03/17/23 Performing Technologist: Sherren Kerns RVS  Examination Guidelines: A complete evaluation includes B-mode imaging, spectral Doppler, color Doppler, and power Doppler as needed of all accessible portions of each vessel. Unilateral testing is considered an integral part of a complete examination. Limited examinations for reoccurring indications may be performed as noted.  +------------+----------+-------------+----------+--------+ OUTFLOW VEINPSV (cm/s)Diameter (cm)Depth (cm)Describe +------------+----------+-------------+----------+--------+ Clavicle       255                                    +------------+----------+-------------+----------+--------+ Shoulder        57                                    +------------+----------+-------------+----------+--------+ Prox UA         56                                    +------------+----------+-------------+----------+--------+ Mid UA          82                                    +------------+----------+-------------+----------+--------+ Dist UA        101                                    +------------+----------+-------------+----------+--------+ [facility] [facility]  10 mm balloon, there was markedly improved flow in the right innominate vein. The flow in the right subclavian vein also improved but there are residual densities in the right subclavian vein which may represent calcifications. Residual collateral flow in the right axilla and right lower neck at the end of the procedure. Overall, the flow in the central veins was improved at the end of the procedure. IMPRESSION: 1. Right upper extremity brachiocephalic fistula is patent. Areas of stenosis involving the proximal cephalic vein, right subclavian vein and right innominate vein were successfully treated with balloon angioplasty. 2. 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 This aneurysm is partially thrombosed. Suspect this area is being cannulated during dialysis which is causing problems. This aneurysm area was marked on the skin and recommend avoiding this area for dialysis cannulation. Recommend surgical consultation to evaluate this aneurysm formation. ACCESS: This access remains amenable to future percutaneous interventions as clinically indicated. Electronically Signed   By: Richarda Overlie M.D.   On: 05/14/2023 17:37   VAS US DUPLEX DIALYSIS ACCESS (AVF, AVG)  Result Date: 05/13/2023 DIALYSIS ACCESS Patient Name:  [name]  Date of Exam:   05/11/2023 Medical Rec #: [id number]      Accession #:    [id number] Date of Birth: 1981/05/15       Patient Gender: F Patient Age:   42 years Exam Location:  [facility] Procedure:      VAS US DUPLEX DIALYSIS ACCESS (AVF, AVG) Referring Phys: Crista Elliot  --------------------------------------------------------------------------------  Reason for Exam: Unable to dialyze through AVF/AVG. Access Site: Right Upper Extremity. Access Type: Brachial-cephalic AVF. History: HX of RUE AVF revision 10/23/21 (aneurysm complication & ulcerated          skin). Limitations: Calcification Comparison Study: Prior right fistula duplex done 03/17/23 Performing Technologist: Sherren Kerns RVS  Examination Guidelines: A complete evaluation includes B-mode imaging, spectral Doppler, color Doppler, and power Doppler as needed of all accessible portions of each vessel. Unilateral testing is considered an integral part of a complete examination. Limited examinations for reoccurring indications may be performed as noted.  +------------+----------+-------------+----------+--------+ OUTFLOW VEINPSV (cm/s)Diameter (cm)Depth (cm)Describe +------------+----------+-------------+----------+--------+ Clavicle       255                                    +------------+----------+-------------+----------+--------+ Shoulder        57                                    +------------+----------+-------------+----------+--------+ Prox UA         56                                    +------------+----------+-------------+----------+--------+ Mid UA          82                                    +------------+----------+-------------+----------+--------+ Dist UA        101                                    +------------+----------+-------------+----------+--------+ [facility]  10 mm balloon, there was markedly improved flow in the right innominate vein. The flow in the right subclavian vein also improved but there are residual densities in the right subclavian vein which may represent calcifications. Residual collateral flow in the right axilla and right lower neck at the end of the procedure. Overall, the flow in the central veins was improved at the end of the procedure. IMPRESSION: 1. Right upper extremity brachiocephalic fistula is patent. Areas of stenosis involving the proximal cephalic vein, right subclavian vein and right innominate vein were successfully treated with balloon angioplasty. 2. 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 This aneurysm is partially thrombosed. Suspect this area is being cannulated during dialysis which is causing problems. This aneurysm area was marked on the skin and recommend avoiding this area for dialysis cannulation. Recommend surgical consultation to evaluate this aneurysm formation. ACCESS: This access remains amenable to future percutaneous interventions as clinically indicated. Electronically Signed   By: Richarda Overlie M.D.   On: 05/14/2023 17:37   VAS US DUPLEX DIALYSIS ACCESS (AVF, AVG)  Result Date: 05/13/2023 DIALYSIS ACCESS Patient Name:  [name]  Date of Exam:   05/11/2023 Medical Rec #: [id number]      Accession #:    [id number] Date of Birth: 1981/05/15       Patient Gender: F Patient Age:   42 years Exam Location:  [facility] Procedure:      VAS US DUPLEX DIALYSIS ACCESS (AVF, AVG) Referring Phys: Crista Elliot  --------------------------------------------------------------------------------  Reason for Exam: Unable to dialyze through AVF/AVG. Access Site: Right Upper Extremity. Access Type: Brachial-cephalic AVF. History: HX of RUE AVF revision 10/23/21 (aneurysm complication & ulcerated          skin). Limitations: Calcification Comparison Study: Prior right fistula duplex done 03/17/23 Performing Technologist: Sherren Kerns RVS  Examination Guidelines: A complete evaluation includes B-mode imaging, spectral Doppler, color Doppler, and power Doppler as needed of all accessible portions of each vessel. Unilateral testing is considered an integral part of a complete examination. Limited examinations for reoccurring indications may be performed as noted.  +------------+----------+-------------+----------+--------+ OUTFLOW VEINPSV (cm/s)Diameter (cm)Depth (cm)Describe +------------+----------+-------------+----------+--------+ Clavicle       255                                    +------------+----------+-------------+----------+--------+ Shoulder        57                                    +------------+----------+-------------+----------+--------+ Prox UA         56                                    +------------+----------+-------------+----------+--------+ Mid UA          82                                    +------------+----------+-------------+----------+--------+ Dist UA        101                                    +------------+----------+-------------+----------+--------+ [facility]  10 mm balloon, there was markedly improved flow in the right innominate vein. The flow in the right subclavian vein also improved but there are residual densities in the right subclavian vein which may represent calcifications. Residual collateral flow in the right axilla and right lower neck at the end of the procedure. Overall, the flow in the central veins was improved at the end of the procedure. IMPRESSION: 1. Right upper extremity brachiocephalic fistula is patent. Areas of stenosis involving the proximal cephalic vein, right subclavian vein and right innominate vein were successfully treated with balloon angioplasty. 2. 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 This aneurysm is partially thrombosed. Suspect this area is being cannulated during dialysis which is causing problems. This aneurysm area was marked on the skin and recommend avoiding this area for dialysis cannulation. Recommend surgical consultation to evaluate this aneurysm formation. ACCESS: This access remains amenable to future percutaneous interventions as clinically indicated. Electronically Signed   By: Richarda Overlie M.D.   On: 05/14/2023 17:37   VAS US DUPLEX DIALYSIS ACCESS (AVF, AVG)  Result Date: 05/13/2023 DIALYSIS ACCESS Patient Name:  [name]  Date of Exam:   05/11/2023 Medical Rec #: [id number]      Accession #:    [id number] Date of Birth: 1981/05/15       Patient Gender: F Patient Age:   42 years Exam Location:  [facility] Procedure:      VAS US DUPLEX DIALYSIS ACCESS (AVF, AVG) Referring Phys: Crista Elliot  --------------------------------------------------------------------------------  Reason for Exam: Unable to dialyze through AVF/AVG. Access Site: Right Upper Extremity. Access Type: Brachial-cephalic AVF. History: HX of RUE AVF revision 10/23/21 (aneurysm complication & ulcerated          skin). Limitations: Calcification Comparison Study: Prior right fistula duplex done 03/17/23 Performing Technologist: Sherren Kerns RVS  Examination Guidelines: A complete evaluation includes B-mode imaging, spectral Doppler, color Doppler, and power Doppler as needed of all accessible portions of each vessel. Unilateral testing is considered an integral part of a complete examination. Limited examinations for reoccurring indications may be performed as noted.  +------------+----------+-------------+----------+--------+ OUTFLOW VEINPSV (cm/s)Diameter (cm)Depth (cm)Describe +------------+----------+-------------+----------+--------+ Clavicle       255                                    +------------+----------+-------------+----------+--------+ Shoulder        57                                    +------------+----------+-------------+----------+--------+ Prox UA         56                                    +------------+----------+-------------+----------+--------+ Mid UA          82                                    +------------+----------+-------------+----------+--------+ Dist UA        101                                    +------------+----------+-------------+----------+--------+ [facility]  Physician Discharge Summary  [name] NWG:[id number] DOB: Aug 10, 1981 DOA: 04/30/2023  PCP: [facility]  Admit date: 04/30/2023 Discharge date: 05/24/2023  Time spent: 46 minutes  Recommendations for Outpatient Follow-up:  Recommend CBC renal panel 2 to 3 days iron studies in 1 week to 3 weeks Needs extensive education about midline wound may need a VAC outpatient follow-up with surgery-patient noncompliant with some dressings in the hospital--- please see below narrative for supplies for back/wound dressings Please have dentist follow-up with the patient in the outpatient setting-patient needs extraction of front tooth-we do not have a dentist here  Discharge Diagnoses:  MAIN problem for hospitalization   SBO with ex lap extended ileocecectomy 9/21 Dr. Hillery Hunter complicated by small bowel ischemia ESRD on HD MWF Balloon angioplasty fistula 05/14/2023 metabolic bone disease Hypotension responsive to midodrine needs to be on this Loose incisor teeth  Please see below for itemized issues addressed in HOpsital- refer to other progress notes for clarity if needed  Discharge Condition: Overall quite guarded-her conditions are life limiting  Diet recommendation: Renal fluid restricted  Filed Weights   05/24/23 0402 05/24/23 1045 05/24/23 1057  Weight: 49 kg 21.8 kg 48 kg    History of present illness:  42 year old [place] resident , type 1 diabetes with diabetic retinopathy/blindness, peripheral vascular disease status post bilateral BKA, ESRD on dialysis, hypertension, hyperlipidemia, CAD, mesenteric ischemia with chronic pneumatosis who initially presented with complaint of acute onset abdominal pain, diarrhea.  Septic on arrival  CT abdomen/pelvis showed small bowel obstruction with dilated ileal segment up to 4 cm, sigmoid colitis, diffuse gastric wall thickening, cholelithiasis.   General surgery was consulted and was admitted for the management of  small bowel obstruction.   Nephrology consulted for dialysis.   General surgery did  diagnostic laparoscopy which was converted to ex lap with extended ileocecectomy and end ileostomy on 05/04/2023. S/P IR fistulogram and balloon angioplasty 05/14/23.  Prolonged hospitalization--- 10/5 leukocytosis increasing pain CT abdomen pelvis diffuse colitis no concerns for abscess or worsening    Hospital Course:  Partial SBO on admission not responsive to conservative therapy Ex lap 9/21 with extended ileocecectomy complication of ischemic bowel She completed antibiotics and wound VAC 9/28-she had intermittent issues with leukocytosis on 9/26 and had some hypotension on 10/5 repeat CTs did not show any other issues however when she has been doing fair with wet-to-dry dressings General surgery prescribed pain meds, iron fiber and 3 times daily Imodium which should be continued to bulk up the stools Wound can be cleaned by nursing with Lisette Grinder solution She will need follow-up with surgery in the outpatient setting  ESRD MWF AV fistula angioplasty 10/1-fistula is being used currently Tunneled HD catheter likely needs to be removed as per nephrology in the outpatient setting Continues on iron and (Hectorol MWF Overall was stable  Hypotension at dialysis Midodrine was increased to 10 3 times daily instead of the usual 5 3 times a week and this probably will need to continue given high output from the ostomy which is now decreasing  Anemia of renal disease metabolic bone disease Continues on Sensipar and was placed on Phosrenal instead of sevelamer  Saturation ratios reasonable iron level normal may require ESA as an outpatient setting  Loose incisor teeth Needs dental medicine appointment-had leukocytosis which resolved on its own-she will need outpatient follow-up for this  Medical noncompliance-she was disinterested in looking at her wounds despite imaging on several times by this examiner and general  Physician Discharge Summary  [name] NWG:[id number] DOB: Aug 10, 1981 DOA: 04/30/2023  PCP: [facility]  Admit date: 04/30/2023 Discharge date: 05/24/2023  Time spent: 46 minutes  Recommendations for Outpatient Follow-up:  Recommend CBC renal panel 2 to 3 days iron studies in 1 week to 3 weeks Needs extensive education about midline wound may need a VAC outpatient follow-up with surgery-patient noncompliant with some dressings in the hospital--- please see below narrative for supplies for back/wound dressings Please have dentist follow-up with the patient in the outpatient setting-patient needs extraction of front tooth-we do not have a dentist here  Discharge Diagnoses:  MAIN problem for hospitalization   SBO with ex lap extended ileocecectomy 9/21 Dr. Hillery Hunter complicated by small bowel ischemia ESRD on HD MWF Balloon angioplasty fistula 05/14/2023 metabolic bone disease Hypotension responsive to midodrine needs to be on this Loose incisor teeth  Please see below for itemized issues addressed in HOpsital- refer to other progress notes for clarity if needed  Discharge Condition: Overall quite guarded-her conditions are life limiting  Diet recommendation: Renal fluid restricted  Filed Weights   05/24/23 0402 05/24/23 1045 05/24/23 1057  Weight: 49 kg 21.8 kg 48 kg    History of present illness:  42 year old [place] resident , type 1 diabetes with diabetic retinopathy/blindness, peripheral vascular disease status post bilateral BKA, ESRD on dialysis, hypertension, hyperlipidemia, CAD, mesenteric ischemia with chronic pneumatosis who initially presented with complaint of acute onset abdominal pain, diarrhea.  Septic on arrival  CT abdomen/pelvis showed small bowel obstruction with dilated ileal segment up to 4 cm, sigmoid colitis, diffuse gastric wall thickening, cholelithiasis.   General surgery was consulted and was admitted for the management of  small bowel obstruction.   Nephrology consulted for dialysis.   General surgery did  diagnostic laparoscopy which was converted to ex lap with extended ileocecectomy and end ileostomy on 05/04/2023. S/P IR fistulogram and balloon angioplasty 05/14/23.  Prolonged hospitalization--- 10/5 leukocytosis increasing pain CT abdomen pelvis diffuse colitis no concerns for abscess or worsening    Hospital Course:  Partial SBO on admission not responsive to conservative therapy Ex lap 9/21 with extended ileocecectomy complication of ischemic bowel She completed antibiotics and wound VAC 9/28-she had intermittent issues with leukocytosis on 9/26 and had some hypotension on 10/5 repeat CTs did not show any other issues however when she has been doing fair with wet-to-dry dressings General surgery prescribed pain meds, iron fiber and 3 times daily Imodium which should be continued to bulk up the stools Wound can be cleaned by nursing with Lisette Grinder solution She will need follow-up with surgery in the outpatient setting  ESRD MWF AV fistula angioplasty 10/1-fistula is being used currently Tunneled HD catheter likely needs to be removed as per nephrology in the outpatient setting Continues on iron and (Hectorol MWF Overall was stable  Hypotension at dialysis Midodrine was increased to 10 3 times daily instead of the usual 5 3 times a week and this probably will need to continue given high output from the ostomy which is now decreasing  Anemia of renal disease metabolic bone disease Continues on Sensipar and was placed on Phosrenal instead of sevelamer  Saturation ratios reasonable iron level normal may require ESA as an outpatient setting  Loose incisor teeth Needs dental medicine appointment-had leukocytosis which resolved on its own-she will need outpatient follow-up for this  Medical noncompliance-she was disinterested in looking at her wounds despite imaging on several times by this examiner and general  10 mm balloon, there was markedly improved flow in the right innominate vein. The flow in the right subclavian vein also improved but there are residual densities in the right subclavian vein which may represent calcifications. Residual collateral flow in the right axilla and right lower neck at the end of the procedure. Overall, the flow in the central veins was improved at the end of the procedure. IMPRESSION: 1. Right upper extremity brachiocephalic fistula is patent. Areas of stenosis involving the proximal cephalic vein, right subclavian vein and right innominate vein were successfully treated with balloon angioplasty. 2. 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 This aneurysm is partially thrombosed. Suspect this area is being cannulated during dialysis which is causing problems. This aneurysm area was marked on the skin and recommend avoiding this area for dialysis cannulation. Recommend surgical consultation to evaluate this aneurysm formation. ACCESS: This access remains amenable to future percutaneous interventions as clinically indicated. Electronically Signed   By: Richarda Overlie M.D.   On: 05/14/2023 17:37   VAS US DUPLEX DIALYSIS ACCESS (AVF, AVG)  Result Date: 05/13/2023 DIALYSIS ACCESS Patient Name:  [name]  Date of Exam:   05/11/2023 Medical Rec #: [id number]      Accession #:    [id number] Date of Birth: 1981/05/15       Patient Gender: F Patient Age:   42 years Exam Location:  [facility] Procedure:      VAS US DUPLEX DIALYSIS ACCESS (AVF, AVG) Referring Phys: Crista Elliot  --------------------------------------------------------------------------------  Reason for Exam: Unable to dialyze through AVF/AVG. Access Site: Right Upper Extremity. Access Type: Brachial-cephalic AVF. History: HX of RUE AVF revision 10/23/21 (aneurysm complication & ulcerated          skin). Limitations: Calcification Comparison Study: Prior right fistula duplex done 03/17/23 Performing Technologist: Sherren Kerns RVS  Examination Guidelines: A complete evaluation includes B-mode imaging, spectral Doppler, color Doppler, and power Doppler as needed of all accessible portions of each vessel. Unilateral testing is considered an integral part of a complete examination. Limited examinations for reoccurring indications may be performed as noted.  +------------+----------+-------------+----------+--------+ OUTFLOW VEINPSV (cm/s)Diameter (cm)Depth (cm)Describe +------------+----------+-------------+----------+--------+ Clavicle       255                                    +------------+----------+-------------+----------+--------+ Shoulder        57                                    +------------+----------+-------------+----------+--------+ Prox UA         56                                    +------------+----------+-------------+----------+--------+ Mid UA          82                                    +------------+----------+-------------+----------+--------+ Dist UA        101                                    +------------+----------+-------------+----------+--------+ [facility]

## 2023-05-24 NOTE — Progress Notes (Signed)
Report called to admitting facility.

## 2023-05-24 NOTE — Progress Notes (Signed)
[facility] Progress Note   Subjective:    Seen and examined patient on HD unit. Ran even today. She reports abdominal pain and headache. Informed she received pain medication during treatment. She states: "leave me alone, I am in pain".   Objective Vitals:   05/24/23 1300 05/24/23 1330 05/24/23 1400 05/24/23 1412  BP: (!) 145/80 (!) 140/76 137/86   Pulse: 83 93 87   Resp: 12 19 13    Temp:      TempSrc:      SpO2:   100%   Weight:    48 kg   Physical Exam General:chronically ill appearing female in NAD Heart:RRR, no mrg Lungs:CTAB, nml WOB Abdomen: +ostomy in place Extremities:b/l AKA, no edema Dialysis Access: RU AVF +b/t   Filed Weights   05/24/23 1045 05/24/23 1057 05/24/23 1412  Weight: 21.8 kg 48 kg 48 kg    Intake/Output Summary (Last 24 hours) at 05/24/2023 1433 Last data filed at 05/24/2023 1400 Gross per 24 hour  Intake 30 ml  Output 500 ml  Net -470 ml    Additional Objective Labs: Basic Metabolic Panel: Recent Labs  Lab 05/21/23 0356 05/22/23 0450 05/24/23 0549  NA 132* 133* 133*  K 4.0 4.3 4.2  CL 92* 93* 94*  CO2 24 24 25   GLUCOSE 92 120* 175*  BUN 17 28* 20  CREATININE 3.80* 5.18* 4.22*  CALCIUM 9.2 9.9 10.8*  PHOS  --   --  5.1*   Liver Function Tests: Recent Labs  Lab 05/24/23 0549  ALBUMIN 2.4*   No results for input(s): "LIPASE", "AMYLASE" in the last 168 hours. CBC: Recent Labs  Lab 05/18/23 1800 05/19/23 0334 05/21/23 0356 05/22/23 0450 05/24/23 0549  WBC 20.5* 17.6* 17.7* 14.9* 10.5  NEUTROABS  --   --   --   --  7.2  HGB 7.8* 7.4* 8.2* 7.6* 9.4*  HCT 26.1* 24.7* 27.1* 25.5* 31.1*  MCV 87.6 86.4 86.6 87.3 89.1  PLT 342 332 334 359 334   Blood Culture    Component Value Date/Time   SDES BLOOD LEFT HAND 05/03/2023 1305   SPECREQUEST  05/03/2023 1305    AEROBIC BOTTLE ONLY Blood Culture results may not be optimal due to an inadequate volume of blood received in culture bottles   CULT  05/03/2023  1305    NO GROWTH 5 DAYS Performed at [facility], [address] [place], [place] [address]    REPTSTATUS 05/08/2023 FINAL 05/03/2023 1305    Cardiac Enzymes: No results for input(s): "CKTOTAL", "CKMB", "CKMBINDEX", "TROPONINI" in the last 168 hours. CBG: Recent Labs  Lab 05/23/23 0739 05/23/23 1242 05/23/23 1537 05/23/23 1959 05/24/23 0734  GLUCAP 113* 102* 129* 140* 162*   Iron Studies: No results for input(s): "IRON", "TIBC", "TRANSFERRIN", "FERRITIN" in the last 72 hours. Lab Results  Component Value Date   INR 1.9 (H) 05/14/2023   INR 1.3 (H) 04/30/2023   INR 1.3 (H) 06/28/2022   Studies/Results: No results found.  Medications:   aspirin EC  81 mg Oral Daily   atorvastatin  40 mg Oral Daily   Chlorhexidine Gluconate Cloth  6 each Topical Q0600   Chlorhexidine Gluconate Cloth  6 each Topical Q0600   Chlorhexidine Gluconate Cloth  6 each Topical Q0600   darbepoetin (ARANESP) injection - DIALYSIS  60 mcg Subcutaneous Q Fri-1800   doxercalciferol  4 mcg Intravenous Q M,W,F-HD   dronabinol  5 mg Oral BID AC   feeding supplement (NEPRO CARB  STEADY)  237 mL Oral TID BM   ferrous sulfate  325 mg Oral BID WC   heparin  5,000 Units Subcutaneous Q8H   insulin aspart  0-6 Units Subcutaneous TID WC   lanthanum  1,000 mg Oral TID WC   midodrine  10 mg Oral TID WC   pantoprazole  40 mg Oral QHS   polycarbophil  1,250 mg Oral BID    Dialysis Orders: [facility] MWF  3h  400/1.5  49kg   R AVF  Hep none - last OP HD 9/16, post wt 51kg, coming off 2- 4kg over the last 3 wks - mircera IV q 2 wks, last 9/13, due 9/27 - hectorol 4 mcg IV three times per week.   Assessment/Plan: 1. 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 Hillery Hunter on 05/04/23. Surgery following. Palliative care consulting.  2. Sepsis 2/2 ischemic colitis - completed IV ABX on 9/28. Per PMD.  3. ESRD - on MWF.  Continue HD per regular schedule. No UF with  HD.  4. HD access - Unable to cannulate AV fistula on 9/27 because of pulling clots.  IR placed temporary HD catheter and pt received dialysis on 9/27.  Duplex US of AVF showed low blood flow, vascular surgeon and IR were consulted.  Pt went for fistulogram 10/1 by IR. There was a R cephalic stenosis treated w/ venoplasty, and a central stenosis also rx'd w/ venoplasty. 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 This area was marked and we are not to stick within the circular marking. The rest of the AVF is good to stick; did well w/ HD on 10/7.  5. Anemia of CKD- Hgb 9.4. Continue ESA. Tsat in goal. Transfuse prn for Hgb<7. 6. Secondary hyperparathyroidism - Ca at goal.  Senispar on hold d/t GI issues.  Continue VDRA.  Last Phos 5.1, change binders from sevelamer d/t SBO to Fosrenol.  7. HTN/volume - BP soft, on midodrine.  Does not appear volume overloaded. Significant GI losses during admission.  Did not tolerate volume removal last week.  Plan to run even for now.  8. Nutrition - Renal diet w/fluid restrictions.  Salome Holmes, NP [facility] 05/24/2023,2:33 PM  LOS: 24 days

## 2023-05-24 NOTE — Progress Notes (Signed)
   05/24/23 1400  Vitals  BP 137/86  BP Location Left Arm  BP Method Automatic  Patient Position (if appropriate) Lying  Pulse Rate 87  Resp 13  Oxygen Therapy  SpO2 100 %  O2 Device Room Air  During Treatment Monitoring  Blood Flow Rate (mL/min) 199 mL/min  Arterial Pressure (mmHg) -19.19 mmHg  Venous Pressure (mmHg) 308.07 mmHg  TMP (mmHg) 3.03 mmHg  Ultrafiltration Rate (mL/min) 385 mL/min  Dialysate Flow Rate (mL/min) 299 ml/min  Dialysate Potassium Concentration 3  Dialysate Calcium Concentration 2.5  Duration of HD Treatment -hour(s) 2.58 hour(s)  Cumulative Fluid Removed (mL) per Treatment  -159.88  HD Safety Checks Performed Yes  Intra-Hemodialysis Comments Tx completed  Post Treatment  Dialyzer Clearance Lightly streaked  Hemodialysis Intake (mL) 0 mL  Fluid Removed (mL) 0 mL  Tolerated HD Treatment Yes  Post-Hemodialysis Comments Pt cut off 20 min d/t need infitrated.Salome Holmes  PA notified.  AVG/AVF Arterial Site Held (minutes) 10 minutes  AVG/AVF Venous Site Held (minutes) 10 minutes  Hemodialysis Catheter Left Internal jugular Triple lumen Temporary (Non-Tunneled)  Placement Date/Time: 05/10/23 1450   Serial / Lot #: [id number]  Expiration Date: 08/11/27  Time Out: Correct patient;Correct site;Correct procedure  Maximum sterile barrier precautions: Hand hygiene;Cap;Mask;Sterile gown;Sterile gloves;Sterile probe ...  Site Condition No complications

## 2023-05-24 NOTE — Progress Notes (Signed)
D/C order noted. Contacted [facility] to advise clinic of pt's d/c today and that pt should resume care on Monday.   Olivia Canter Renal Navigator [phone]

## 2023-05-25 ENCOUNTER — Emergency Department (HOSPITAL_COMMUNITY): Payer: Medicare Other

## 2023-05-25 ENCOUNTER — Encounter (HOSPITAL_COMMUNITY): Payer: Self-pay | Admitting: *Deleted

## 2023-05-25 ENCOUNTER — Emergency Department (HOSPITAL_COMMUNITY)
Admission: EM | Admit: 2023-05-25 | Discharge: 2023-05-25 | Disposition: A | Discharge status: Home / Self Care | Payer: Medicare Other | Source: Home / Self Care | Attending: Emergency Medicine | Admitting: Emergency Medicine

## 2023-05-25 ENCOUNTER — Other Ambulatory Visit: Payer: Self-pay

## 2023-05-25 DIAGNOSIS — A419 Sepsis, unspecified organism: Secondary | ICD-10-CM | POA: Diagnosis not present

## 2023-05-25 DIAGNOSIS — D72829 Elevated white blood cell count, unspecified: Secondary | ICD-10-CM | POA: Insufficient documentation

## 2023-05-25 DIAGNOSIS — N186 End stage renal disease: Secondary | ICD-10-CM | POA: Insufficient documentation

## 2023-05-25 DIAGNOSIS — Z91010 Allergy to peanuts: Secondary | ICD-10-CM | POA: Insufficient documentation

## 2023-05-25 DIAGNOSIS — Z794 Long term (current) use of insulin: Secondary | ICD-10-CM | POA: Insufficient documentation

## 2023-05-25 DIAGNOSIS — E1122 Type 2 diabetes mellitus with diabetic chronic kidney disease: Secondary | ICD-10-CM | POA: Insufficient documentation

## 2023-05-25 DIAGNOSIS — K9400 Colostomy complication, unspecified: Secondary | ICD-10-CM | POA: Insufficient documentation

## 2023-05-25 DIAGNOSIS — Z992 Dependence on renal dialysis: Secondary | ICD-10-CM | POA: Insufficient documentation

## 2023-05-25 DIAGNOSIS — N189 Chronic kidney disease, unspecified: Secondary | ICD-10-CM

## 2023-05-25 LAB — COMPREHENSIVE METABOLIC PANEL
ALT: 13 U/L (ref 0–44)
AST: 14 U/L — ABNORMAL LOW (ref 15–41)
Albumin: 2.8 g/dL — ABNORMAL LOW (ref 3.5–5.0)
Alkaline Phosphatase: 101 U/L (ref 38–126)
Anion gap: 23 — ABNORMAL HIGH (ref 5–15)
BUN: 18 mg/dL (ref 6–20)
CO2: 20 mmol/L — ABNORMAL LOW (ref 22–32)
Calcium: 11.7 mg/dL — ABNORMAL HIGH (ref 8.9–10.3)
Chloride: 91 mmol/L — ABNORMAL LOW (ref 98–111)
Creatinine, Ser: 3.98 mg/dL — ABNORMAL HIGH (ref 0.44–1.00)
GFR, Estimated: 14 mL/min — ABNORMAL LOW (ref >60)
Glucose, Bld: 282 mg/dL — ABNORMAL HIGH (ref 70–99)
Potassium: 4.5 mmol/L (ref 3.5–5.1)
Sodium: 134 mmol/L — ABNORMAL LOW (ref 135–145)
Total Bilirubin: 1 mg/dL (ref 0.3–1.2)
Total Protein: 8.4 g/dL — ABNORMAL HIGH (ref 6.5–8.1)

## 2023-05-25 LAB — CBC WITH DIFFERENTIAL/PLATELET
Abs Immature Granulocytes: 0.06 10*3/uL (ref 0.00–0.07)
Basophils Absolute: 0.1 10*3/uL (ref 0.0–0.1)
Basophils Relative: 0 %
Eosinophils Absolute: 0.1 10*3/uL (ref 0.0–0.5)
Eosinophils Relative: 1 %
HCT: 30.2 % — ABNORMAL LOW (ref 36.0–46.0)
Hemoglobin: 8.5 g/dL — ABNORMAL LOW (ref 12.0–15.0)
Immature Granulocytes: 1 %
Lymphocytes Relative: 11 %
Lymphs Abs: 1.4 10*3/uL (ref 0.7–4.0)
MCH: 25.8 pg — ABNORMAL LOW (ref 26.0–34.0)
MCHC: 28.1 g/dL — ABNORMAL LOW (ref 30.0–36.0)
MCV: 91.5 fL (ref 80.0–100.0)
Monocytes Absolute: 0.6 10*3/uL (ref 0.1–1.0)
Monocytes Relative: 5 %
Neutro Abs: 10.3 10*3/uL — ABNORMAL HIGH (ref 1.7–7.7)
Neutrophils Relative %: 82 %
Platelets: 349 10*3/uL (ref 150–400)
RBC: 3.3 MIL/uL — ABNORMAL LOW (ref 3.87–5.11)
RDW: 20.4 % — ABNORMAL HIGH (ref 11.5–15.5)
WBC: 12.6 10*3/uL — ABNORMAL HIGH (ref 4.0–10.5)
nRBC: 0.2 % (ref 0.0–0.2)

## 2023-05-25 LAB — HCG, SERUM, QUALITATIVE: Preg, Serum: NEGATIVE

## 2023-05-25 LAB — LIPASE, BLOOD: Lipase: 55 U/L — ABNORMAL HIGH (ref 11–51)

## 2023-05-25 LAB — I-STAT CG4 LACTIC ACID, ED
Lactic Acid, Venous: 2.8 mmol/L (ref 0.5–1.9)
Lactic Acid, Venous: 1.2 mmol/L (ref 0.5–1.9)

## 2023-05-25 MED ORDER — LACTATED RINGERS IV BOLUS: 500.0000 mL | Freq: Once | INTRAVENOUS | Status: DC

## 2023-05-25 MED ORDER — LACTATED RINGERS IV BOLUS
1000.0000 mL | Freq: Once | INTRAVENOUS | Status: AC
  Administered 2023-05-25: 1000 mL via INTRAVENOUS

## 2023-05-25 MED ORDER — FENTANYL CITRATE PF 50 MCG/ML IJ SOSY
50.0000 ug | PREFILLED_SYRINGE | Freq: Once | INTRAMUSCULAR | Status: AC
  Administered 2023-05-25: 50 ug via INTRAVENOUS
  Filled 2023-05-25: qty 1

## 2023-05-25 MED ORDER — IOHEXOL 350 MG/ML SOLN
75.0000 mL | Freq: Once | INTRAVENOUS | Status: AC | PRN
  Administered 2023-05-25: 75 mL via INTRAVENOUS

## 2023-05-25 MED ORDER — SODIUM CHLORIDE 0.9 % IV BOLUS
500.0000 mL | Freq: Once | INTRAVENOUS | Status: AC
  Administered 2023-05-25: 500 mL via INTRAVENOUS

## 2023-05-25 NOTE — ED Notes (Signed)
[facility] Called for transport

## 2023-05-25 NOTE — ED Notes (Signed)
Pt is hard stick. RN attempted 2x without success. EDP notified and consult initiated

## 2023-05-25 NOTE — ED Notes (Signed)
Pt placed on new pulse Ox

## 2023-05-25 NOTE — ED Notes (Signed)
NS ordering colostomy supplies. EDP states to pack abdominal incision with sterile gauze and cover with ABD dressing.

## 2023-05-25 NOTE — ED Notes (Signed)
Colostomy bay applied to pt

## 2023-05-25 NOTE — Progress Notes (Signed)
Patient discharged.

## 2023-05-25 NOTE — ED Provider Notes (Signed)
[place] EMERGENCY DEPARTMENT AT [facility] Provider Note   CSN: [id number] Arrival date & time: 05/25/23  1022     History  Chief Complaint  Patient presents with   Weakness    [name] is a 42 y.o. female.   Weakness    Patient has a history of multiple medical problems including diabetes, end-stage renal disease on dialysis, anxiety, blindness, Guillain-Barr, stroke.  Patient has had a complicated recent medical history.  She was admitted to the hospital on September 17.  Patient was treated for small bowel obstruction that required an extended ileal cecotomy that was complicated by small bowel ischemia.  Patient ended up having surgery on September 21.  She also end up requiring balloon angioplasty of the fistula on October 1.  Patient had hypotension during her hospitalization requiring midodrine.  Patient was discharged from the hospital on October 11.  Patient presents today with complaints of abdominal pain.  Patient is having tenderness specifically at the site of the ostomy.  Patient states she states fluid is coming out.  Home Medications Prior to Admission medications   Medication Sig Start Date End Date Taking? Authorizing Provider  acetaminophen (TYLENOL) 325 MG tablet Take 650 mg by mouth every 6 (six) hours as needed for fever or mild pain.   Yes [provider]  atorvastatin (LIPITOR) 40 MG tablet Place 1 tablet (40 mg total) into feeding tube daily. Patient taking differently: Take 40 mg by mouth daily. 10/26/21  Yes Burnadette Pop, MD  azelastine (OPTIVAR) 0.05 % ophthalmic solution Place 1 drop into both eyes 2 (two) times daily as needed (allergies). 11/13/22  Yes [provider]  cetirizine (ZYRTEC) 10 MG tablet Take 10 mg by mouth daily.   Yes [provider]  Dorzolamide HCl-Timolol Mal PF 2-0.5 % SOLN Place 1 drop into the left eye in the morning and at bedtime.   Yes [provider]  dorzolamide-timolol  (COSOPT) 22.3-6.8 MG/ML ophthalmic solution Place 1 drop into the left eye 2 (two) times daily. 03/30/21  Yes [provider]  dronabinol (MARINOL) 5 MG capsule Take 1 capsule (5 mg total) by mouth 2 (two) times daily before lunch and supper. 05/24/23  Yes Rhetta Mura, MD  glucagon, human recombinant, (GLUCAGEN DIAGNOSTIC) 1 MG injection Inject 1 mg into the vein once as needed for low blood sugar.   Yes [provider]  HYDROcodone-acetaminophen (NORCO/VICODIN) 5-325 MG tablet Take 1 tablet by mouth every 6 (six) hours as needed for moderate pain. 05/24/23  Yes Rhetta Mura, MD  insulin aspart (FIASP FLEXTOUCH) 100 UNIT/ML FlexTouch Pen Inject 1-10 Units into the skin 4 (four) times daily -  before meals and at bedtime. Inject as per sliding scale: If 121-150= 1 units call MD if blood sugar <70 or 400>; 151-200= 2 units 201-250= 3 units 251-300= 5 units 301-350= 7 units 351-400= 10 units Subcutaneously before meals and at bedtime.   Yes [provider]  insulin glargine (SEMGLEE) 100 UNIT/ML injection Inject 0.1 mLs (10 Units total) into the skin daily at 6 PM. Patient taking differently: Inject 10 Units into the skin daily. 12/28/22  Yes Dahal, Melina Schools, MD  lanthanum (FOSRENOL) 1000 MG chewable tablet Chew 1 tablet (1,000 mg total) by mouth 3 (three) times daily with meals. 05/24/23  Yes Rhetta Mura, MD  loperamide (IMODIUM) 2 MG capsule Take 1 capsule (2 mg total) by mouth every 6 (six) hours as needed for diarrhea or loose stools. 05/24/23  Yes Samtani,  Jai-Gurmukh, MD  midodrine (PROAMATINE) 10 MG tablet Take 1 tablet (10 mg total) by mouth 3 (three) times daily with meals. 05/24/23  Yes Rhetta Mura, MD  Multiple Vitamin (MULTIVITAMIN WITH MINERALS) TABS tablet Take 1 tablet by mouth daily.   Yes [provider]  ondansetron (ZOFRAN) 4 MG tablet Take 4 mg by mouth every 4 (four) hours as needed for nausea/vomiting. 03/02/22   Yes [provider]  polycarbophil (FIBERCON) 625 MG tablet Take 2 tablets (1,250 mg total) by mouth 2 (two) times daily. 05/24/23  Yes Rhetta Mura, MD  thiamine 100 MG tablet Place 1 tablet (100 mg total) into feeding tube daily. Patient taking differently: Take 100 mg by mouth daily. 10/26/21  Yes Burnadette Pop, MD  Accu-Chek FastClix Lancets MISC Use as instructed to check blood sugar up to TID. E11.22 Patient taking differently: 1 each by Other route See admin instructions. Use as instructed to check blood sugar up to TID. E11.22 04/15/19   Hoy Register, MD  Continuous Blood Gluc Transmit (DEXCOM G6 TRANSMITTER) MISC 1 Device by Does not apply route as directed. 09/12/20   Shamleffer, Konrad Dolores, MD  glucose blood (ACCU-CHEK GUIDE) test strip Use as instructed to check blood sugar up to TID. E11.22 Patient taking differently: 1 each by Other route See admin instructions. Use as instructed to check blood sugar up to TID. E11.22 07/17/19   Fulp, Cammie, MD  Nutritional Supplements (NUTRITIONAL DRINK) LIQD Take 120 mLs by mouth daily.    [provider]      Allergies    Morphine, Peanut-containing drug products, Penicillins, Shellfish allergy, and Chocolate    Review of Systems   Review of Systems  Neurological:  Positive for weakness.    Physical Exam Updated Vital Signs BP 97/60 (BP Location: Left Arm)   Pulse 88   Temp (!) 97.5 F (36.4 C) (Oral)   Resp 14   Ht 1.549 m (5\' 1" )   Wt 48 kg   SpO2 100%   BMI 19.99 kg/m  Physical Exam Vitals and nursing note reviewed.  Constitutional:      Appearance: She is well-developed. She is ill-appearing.  HENT:     Head: Normocephalic and atraumatic.     Right Ear: External ear normal.     Left Ear: External ear normal.  Eyes:     General: No scleral icterus.       Right eye: No discharge.        Left eye: No discharge.     Conjunctiva/sclera: Conjunctivae normal.  Neck:     Trachea: No tracheal  deviation.  Cardiovascular:     Rate and Rhythm: Normal rate and regular rhythm.  Pulmonary:     Effort: Pulmonary effort is normal. No respiratory distress.     Breath sounds: Normal breath sounds. No stridor. No wheezing or rales.  Abdominal:     General: There is no distension.     Palpations: Abdomen is soft.     Tenderness: There is abdominal tenderness. There is guarding. There is no rebound.     Comments: Patient has a open abdominal wound, midline, packing removed, appears to be contaminated, some black tissue noted left side of the abdominal wound, the right sided ileocecostomy not have a colostomy bag in place.  Actively extruding liquid and formed stool onto the abdominal wall, some fascial breakdown of the surrounding tissue  Musculoskeletal:        General: No tenderness or deformity.  Cervical back: Neck supple.  Skin:    General: Skin is warm and dry.     Findings: No rash.  Neurological:     General: No focal deficit present.     Mental Status: She is alert.     Cranial Nerves: No cranial nerve deficit, dysarthria or facial asymmetry.     Sensory: No sensory deficit.     Motor: No abnormal muscle tone or seizure activity.     Coordination: Coordination normal.  Psychiatric:        Mood and Affect: Mood normal.     ED Results / Procedures / Treatments   Labs (all labs ordered are listed, but only abnormal results are displayed) Labs Reviewed  COMPREHENSIVE METABOLIC PANEL - Abnormal; Notable for the following components:      Result Value   Sodium 134 (*)    Chloride 91 (*)    CO2 20 (*)    Glucose, Bld 282 (*)    Creatinine, Ser 3.98 (*)    Calcium 11.7 (*)    Total Protein 8.4 (*)    Albumin 2.8 (*)    AST 14 (*)    GFR, Estimated 14 (*)    Anion gap 23 (*)    All other components within normal limits  LIPASE, BLOOD - Abnormal; Notable for the following components:   Lipase 55 (*)    All other components within normal limits  CBC WITH  DIFFERENTIAL/PLATELET - Abnormal; Notable for the following components:   WBC 12.6 (*)    RBC 3.30 (*)    Hemoglobin 8.5 (*)    HCT 30.2 (*)    MCH 25.8 (*)    MCHC 28.1 (*)    RDW 20.4 (*)    Neutro Abs 10.3 (*)    All other components within normal limits  I-STAT CG4 LACTIC ACID, ED - Abnormal; Notable for the following components:   Lactic Acid, Venous 2.8 (*)    All other components within normal limits  CULTURE, BLOOD (ROUTINE X 2)  CULTURE, BLOOD (ROUTINE X 2)  HCG, SERUM, QUALITATIVE  I-STAT CG4 LACTIC ACID, ED    EKG None  Radiology CT ABDOMEN PELVIS W CONTRAST  Result Date: 05/25/2023 CLINICAL DATA:  Abdominal pain and weakness. EXAM: CT ABDOMEN AND PELVIS WITH CONTRAST TECHNIQUE: Multidetector CT imaging of the abdomen and pelvis was performed using the standard protocol following bolus administration of intravenous contrast. RADIATION DOSE REDUCTION: This exam was performed according to the departmental dose-optimization program which includes automated exposure control, adjustment of the mA and/or kV according to patient size and/or use of iterative reconstruction technique. CONTRAST:  75mL OMNIPAQUE IOHEXOL 350 MG/ML SOLN COMPARISON:  CT abdomen pelvis dated May 18, 2023. FINDINGS: Lower chest: No acute abnormality. Hepatobiliary: No focal liver abnormality. Excreted contrast within the gallbladder with few tiny filling defects consistent gallstones. No gallbladder wall thickening. Slightly increased mild common bile duct dilatation measuring 10 mm in diameter, previously 9 mm. Pancreas: Unremarkable. No pancreatic ductal dilatation or surrounding inflammatory changes. 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 No new focal abnormality. Normal size. Adrenals/Urinary Tract: Adrenal glands are unremarkable. Unchanged bilateral renal atrophy. No renal mass, calculi, or hydronephrosis. Excreted contrast within the decompressed bladder. Stomach/Bowel: The stomach is within  normal limits. 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 No bowel wall thickening, distention, or surrounding inflammatory changes. Vascular/Lymphatic: Marked aortoiliac and branch vessel atherosclerotic calcification again noted. No enlarged abdominal or pelvic lymph nodes. Reproductive: Unchanged  3.6 cm degenerating fibroid in the right uterus. No adnexal mass. Other: No free fluid or pneumoperitoneum. Musculoskeletal: Partial interval healing of the midline anterior abdominal wall incision with smaller defect compared to prior study. No acute or significant osseous findings. IMPRESSION: 1. No acute intra-abdominal process. 2. Slightly increased mild common bile duct dilatation measuring 10 mm in diameter, previously 9 mm. Correlate with LFTs. 3. Cholelithiasis. 4.  Aortic Atherosclerosis (ICD10-I70.0). Electronically Signed   By: Obie Dredge M.D.   On: 05/25/2023 14:07    Procedures Procedures    Medications Ordered in ED Medications  sodium chloride 0.9 % bolus 500 mL (0 mLs Intravenous Stopped 05/25/23 1245)  fentaNYL (SUBLIMAZE) injection 50 mcg (50 mcg Intravenous Given 05/25/23 1210)  iohexol (OMNIPAQUE) 350 MG/ML injection 75 mL (75 mLs Intravenous Contrast Given 05/25/23 1309)    ED Course/ Medical Decision Making/ A&P Clinical Course as of 05/25/23 1620  Sat May 25, 2023  1100 Blood pressure improved at bedside.  Systolic of 100 [JK]  1237 CBC with Diff(!) Leukocytosis noted [JK]  1255 Comprehensive metabolic panel(!) Electrolyte abnormalities consistent with her chronic kidney disease [JK]  1255 Lipase, blood(!) Lipase slightly elevated, doubt clinically significant [JK]  1326 Blood pressure has improved with treatment [JK]  1517 CT scan does not show any acute abnormality [JK]    Clinical Course User Index [JK] Linwood Dibbles, MD                                 Medical Decision Making Problems Addressed: Chronic kidney  disease, unspecified CKD stage: chronic illness or injury Colostomy complication [facility]): acute illness or injury that poses a threat to life or bodily functions  Amount and/or Complexity of Data Reviewed Labs: ordered. Decision-making details documented in ED Course. Radiology: ordered and independent interpretation performed.  Risk Prescription drug management.   Patient presented to the ED with complaints of abdominal pain.  Patient has very complex recent medical history including complex abdominal surgery.  Patient arrived without a colostomy bag in place.  She had enteric contents on her abdominal wall.  This was causing some mucosal skin irritation.  Patient also had a open abdominal wound that was healing by secondary intention.  Patient's CT scans do not show any signs of recurrent infection or acute process.  Unfortunately the ostomy nurse is not available on the weekends.  ED staff reapplied her colostomy dressing.  Will have her follow-up with her surgical team during the week to reassess her abdominal wounds        Final Clinical Impression(s) / ED Diagnoses Final diagnoses:  Colostomy complication (HCC)  Chronic kidney disease, unspecified CKD stage    Rx / DC Orders ED Discharge Orders     None         Linwood Dibbles, MD 05/25/23 1620

## 2023-05-25 NOTE — Plan of Care (Signed)
  Problem: Pain Managment: Goal: General experience of comfort will improve Outcome: Progressing   Problem: Safety: Goal: Ability to remain free from injury will improve Outcome: Progressing   

## 2023-05-25 NOTE — ED Triage Notes (Signed)
Patient presents to ed from [facility]  she is a dialysis patient M-W-F, has a central line in place. Per ems staff states they are having a hard time getting her ileostomy bag to stay connected. Patietn has right arm restricted.

## 2023-05-25 NOTE — Discharge Instructions (Addendum)
CT scan did not show any signs of recurrent infection or complication.  Follow-up with your surgical team to recheck on your surgical wounds and to help with the skin irritation from your ostomy.  Continue your current medications.

## 2023-05-26 NOTE — Discharge Planning (Signed)
[facility] Patient Discharge Orders- [facility] CLINIC: [facility]  Patient's name: [name] Admit/DC Dates: 05/25/2023 - 05/25/2023. Patient was admitted 04/30/23-05/24/2023. She then returned back to the ED 05/25/23 2nd ostomy complication and ABD wounds. CT scan showed no signs for an infection and colostomy was re-applied. Plan for her to f/u with surgery to re-assess her ABD wounds!  Discharge Diagnoses:  SBO with ex lap extended ileocecectomy 9/21 Dr. Hillery Walker complicated by small bowel ischemia   Colostomy complication with ABD wounds, noted she was non-compliant with dressing changes in hospital. Has f/u with surgery on 06/14/23  Last Hgb: 8.5 ESA dose for discharge: mircera 100 mcg IV q 2 weeks   Heparin change: N/A  EDW Change: No  Bath Change: Ensure she is on a 2K/2Ca bath. For some reason, couldn't see original Rx in provider hub.  Access intervention/Change: Yes Details: Unable to cannulate AV fistula on 9/27 because of pulling clots.  IR placed temporary HD catheter and pt received dialysis on 9/27.  Duplex US of AVF showed low blood flow, vascular surgeon and IR were consulted.  Pt went for fistulogram 10/1 by IR. There was a R cephalic stenosis treated w/ venoplasty, and a central stenosis also rx'd w/ venoplasty. 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 This area was marked and we are not to stick within the circular marking. The rest of the AVF is good to stick; did well w/ HD on 10/7 and 10/11. Notify renal team for any ongoing issues.  Hectorol change: Yes-last Ca 11.7 here. Please stop Hectorol for now. Sensipar was stopped here 2nd GI issues. I think we should restart it at 60mg  daily at bedtime and see how she tolerates it. Binders were also changed from Renvela to Fosrenol d/t SBO.  Discharge Labs: Calcium 11.7 Phosphorus 5.1 Albumin 2.8 K+ 4.5  IV  Antibiotics: No   On Coumadin?: No   OTHER/APPTS/LAB ORDERS: Check weekly Ca levels for now.    D/C Meds to be reconciled by nurse after every discharge.  Completed By: Tracey Holmes, NP   Reviewed by: MD:______ RN_______

## 2023-05-26 NOTE — TOC Transition Note (Deleted)
Transition of Care - Initial Contact from Inpatient Facility  Date of discharge: 05/24/23 Date of contact: 05/26/23  Method: Attempted Phone Call Spoke to: No Answer  Attempted to call patient contacted to discuss transition of care from recent inpatient hospitalization but she didn't pick up the phone. Was not able to leave a voicemail.  Patient will return to her outpatient HD unit on: 05/27/23  Salome Holmes, NP

## 2023-05-27 ENCOUNTER — Inpatient Hospital Stay (HOSPITAL_COMMUNITY)
Admission: EM | Admit: 2023-05-27 | Discharge: 2023-07-14 | DRG: 853 | Disposition: E | Payer: Medicare Other | Attending: Internal Medicine | Admitting: Internal Medicine

## 2023-05-27 ENCOUNTER — Emergency Department (HOSPITAL_COMMUNITY): Payer: Medicare Other

## 2023-05-27 DIAGNOSIS — I953 Hypotension of hemodialysis: Secondary | ICD-10-CM | POA: Diagnosis present

## 2023-05-27 DIAGNOSIS — Z833 Family history of diabetes mellitus: Secondary | ICD-10-CM

## 2023-05-27 DIAGNOSIS — E875 Hyperkalemia: Secondary | ICD-10-CM | POA: Diagnosis present

## 2023-05-27 DIAGNOSIS — G9341 Metabolic encephalopathy: Secondary | ICD-10-CM | POA: Diagnosis present

## 2023-05-27 DIAGNOSIS — A419 Sepsis, unspecified organism: Secondary | ICD-10-CM | POA: Diagnosis present

## 2023-05-27 DIAGNOSIS — G9340 Encephalopathy, unspecified: Secondary | ICD-10-CM | POA: Diagnosis not present

## 2023-05-27 DIAGNOSIS — E871 Hypo-osmolality and hyponatremia: Secondary | ICD-10-CM | POA: Diagnosis present

## 2023-05-27 DIAGNOSIS — Z91158 Patient's noncompliance with renal dialysis for other reason: Secondary | ICD-10-CM

## 2023-05-27 DIAGNOSIS — Z932 Ileostomy status: Secondary | ICD-10-CM

## 2023-05-27 DIAGNOSIS — E1122 Type 2 diabetes mellitus with diabetic chronic kidney disease: Secondary | ICD-10-CM | POA: Diagnosis present

## 2023-05-27 DIAGNOSIS — Z66 Do not resuscitate: Secondary | ICD-10-CM | POA: Diagnosis not present

## 2023-05-27 DIAGNOSIS — L89322 Pressure ulcer of left buttock, stage 2: Secondary | ICD-10-CM | POA: Diagnosis present

## 2023-05-27 DIAGNOSIS — G610 Guillain-Barre syndrome: Secondary | ICD-10-CM | POA: Diagnosis present

## 2023-05-27 DIAGNOSIS — T82510A Breakdown (mechanical) of surgically created arteriovenous fistula, initial encounter: Secondary | ICD-10-CM | POA: Diagnosis present

## 2023-05-27 DIAGNOSIS — E8721 Acute metabolic acidosis: Secondary | ICD-10-CM | POA: Diagnosis present

## 2023-05-27 DIAGNOSIS — Z880 Allergy status to penicillin: Secondary | ICD-10-CM

## 2023-05-27 DIAGNOSIS — Q2112 Patent foramen ovale: Secondary | ICD-10-CM

## 2023-05-27 DIAGNOSIS — Z9049 Acquired absence of other specified parts of digestive tract: Secondary | ICD-10-CM

## 2023-05-27 DIAGNOSIS — Z992 Dependence on renal dialysis: Secondary | ICD-10-CM

## 2023-05-27 DIAGNOSIS — Z681 Body mass index (BMI) 19 or less, adult: Secondary | ICD-10-CM | POA: Diagnosis not present

## 2023-05-27 DIAGNOSIS — I2510 Atherosclerotic heart disease of native coronary artery without angina pectoris: Secondary | ICD-10-CM | POA: Diagnosis present

## 2023-05-27 DIAGNOSIS — Y832 Surgical operation with anastomosis, bypass or graft as the cause of abnormal reaction of the patient, or of later complication, without mention of misadventure at the time of the procedure: Secondary | ICD-10-CM | POA: Diagnosis present

## 2023-05-27 DIAGNOSIS — Z91148 Patient's other noncompliance with medication regimen for other reason: Secondary | ICD-10-CM

## 2023-05-27 DIAGNOSIS — N2581 Secondary hyperparathyroidism of renal origin: Secondary | ICD-10-CM | POA: Diagnosis present

## 2023-05-27 DIAGNOSIS — M545 Low back pain, unspecified: Secondary | ICD-10-CM | POA: Diagnosis not present

## 2023-05-27 DIAGNOSIS — Z885 Allergy status to narcotic agent status: Secondary | ICD-10-CM

## 2023-05-27 DIAGNOSIS — Z789 Other specified health status: Secondary | ICD-10-CM | POA: Diagnosis not present

## 2023-05-27 DIAGNOSIS — L89152 Pressure ulcer of sacral region, stage 2: Secondary | ICD-10-CM | POA: Diagnosis present

## 2023-05-27 DIAGNOSIS — Z79899 Other long term (current) drug therapy: Secondary | ICD-10-CM

## 2023-05-27 DIAGNOSIS — Z711 Person with feared health complaint in whom no diagnosis is made: Secondary | ICD-10-CM | POA: Diagnosis not present

## 2023-05-27 DIAGNOSIS — K9400 Colostomy complication, unspecified: Secondary | ICD-10-CM | POA: Diagnosis present

## 2023-05-27 DIAGNOSIS — R6521 Severe sepsis with septic shock: Secondary | ICD-10-CM | POA: Diagnosis present

## 2023-05-27 DIAGNOSIS — N186 End stage renal disease: Secondary | ICD-10-CM | POA: Diagnosis present

## 2023-05-27 DIAGNOSIS — H5461 Unqualified visual loss, right eye, normal vision left eye: Secondary | ICD-10-CM | POA: Diagnosis present

## 2023-05-27 DIAGNOSIS — Z91018 Allergy to other foods: Secondary | ICD-10-CM

## 2023-05-27 DIAGNOSIS — R7989 Other specified abnormal findings of blood chemistry: Secondary | ICD-10-CM | POA: Diagnosis present

## 2023-05-27 DIAGNOSIS — I63542 Cerebral infarction due to unspecified occlusion or stenosis of left cerebellar artery: Secondary | ICD-10-CM | POA: Diagnosis present

## 2023-05-27 DIAGNOSIS — Z794 Long term (current) use of insulin: Secondary | ICD-10-CM

## 2023-05-27 DIAGNOSIS — E1152 Type 2 diabetes mellitus with diabetic peripheral angiopathy with gangrene: Secondary | ICD-10-CM | POA: Diagnosis present

## 2023-05-27 DIAGNOSIS — Z7189 Other specified counseling: Secondary | ICD-10-CM | POA: Diagnosis not present

## 2023-05-27 DIAGNOSIS — F419 Anxiety disorder, unspecified: Secondary | ICD-10-CM | POA: Diagnosis present

## 2023-05-27 DIAGNOSIS — Z89612 Acquired absence of left leg above knee: Secondary | ICD-10-CM

## 2023-05-27 DIAGNOSIS — R569 Unspecified convulsions: Secondary | ICD-10-CM | POA: Diagnosis not present

## 2023-05-27 DIAGNOSIS — I5023 Acute on chronic systolic (congestive) heart failure: Secondary | ICD-10-CM | POA: Diagnosis present

## 2023-05-27 DIAGNOSIS — Z515 Encounter for palliative care: Secondary | ICD-10-CM

## 2023-05-27 DIAGNOSIS — D631 Anemia in chronic kidney disease: Secondary | ICD-10-CM | POA: Diagnosis present

## 2023-05-27 DIAGNOSIS — I959 Hypotension, unspecified: Principal | ICD-10-CM

## 2023-05-27 DIAGNOSIS — E46 Unspecified protein-calorie malnutrition: Secondary | ICD-10-CM | POA: Diagnosis present

## 2023-05-27 DIAGNOSIS — L89151 Pressure ulcer of sacral region, stage 1: Secondary | ICD-10-CM | POA: Diagnosis present

## 2023-05-27 DIAGNOSIS — E876 Hypokalemia: Secondary | ICD-10-CM | POA: Diagnosis not present

## 2023-05-27 DIAGNOSIS — Z91199 Patient's noncompliance with other medical treatment and regimen due to unspecified reason: Secondary | ICD-10-CM

## 2023-05-27 DIAGNOSIS — E785 Hyperlipidemia, unspecified: Secondary | ICD-10-CM | POA: Diagnosis present

## 2023-05-27 DIAGNOSIS — Z7401 Bed confinement status: Secondary | ICD-10-CM

## 2023-05-27 DIAGNOSIS — E872 Acidosis, unspecified: Secondary | ICD-10-CM | POA: Diagnosis present

## 2023-05-27 DIAGNOSIS — Z91010 Allergy to peanuts: Secondary | ICD-10-CM

## 2023-05-27 DIAGNOSIS — E1165 Type 2 diabetes mellitus with hyperglycemia: Secondary | ICD-10-CM | POA: Diagnosis present

## 2023-05-27 DIAGNOSIS — R638 Other symptoms and signs concerning food and fluid intake: Secondary | ICD-10-CM | POA: Diagnosis not present

## 2023-05-27 DIAGNOSIS — Z89611 Acquired absence of right leg above knee: Secondary | ICD-10-CM

## 2023-05-27 DIAGNOSIS — Z5329 Procedure and treatment not carried out because of patient's decision for other reasons: Secondary | ICD-10-CM | POA: Diagnosis not present

## 2023-05-27 DIAGNOSIS — Z91013 Allergy to seafood: Secondary | ICD-10-CM

## 2023-05-27 DIAGNOSIS — R54 Age-related physical debility: Secondary | ICD-10-CM | POA: Diagnosis present

## 2023-05-27 DIAGNOSIS — R4182 Altered mental status, unspecified: Secondary | ICD-10-CM | POA: Diagnosis not present

## 2023-05-27 DIAGNOSIS — I6389 Other cerebral infarction: Secondary | ICD-10-CM | POA: Diagnosis not present

## 2023-05-27 DIAGNOSIS — I5021 Acute systolic (congestive) heart failure: Secondary | ICD-10-CM | POA: Diagnosis not present

## 2023-05-27 DIAGNOSIS — I252 Old myocardial infarction: Secondary | ICD-10-CM

## 2023-05-27 LAB — CBC WITH DIFFERENTIAL/PLATELET
Abs Immature Granulocytes: 0.09 10*3/uL — ABNORMAL HIGH (ref 0.00–0.07)
Basophils Absolute: 0.1 10*3/uL (ref 0.0–0.1)
Basophils Relative: 0 %
Eosinophils Absolute: 0.3 10*3/uL (ref 0.0–0.5)
Eosinophils Relative: 2 %
HCT: 25.3 % — ABNORMAL LOW (ref 36.0–46.0)
Hemoglobin: 7.5 g/dL — ABNORMAL LOW (ref 12.0–15.0)
Immature Granulocytes: 1 %
Lymphocytes Relative: 9 %
Lymphs Abs: 1.2 10*3/uL (ref 0.7–4.0)
MCH: 26.3 pg (ref 26.0–34.0)
MCHC: 29.6 g/dL — ABNORMAL LOW (ref 30.0–36.0)
MCV: 88.8 fL (ref 80.0–100.0)
Monocytes Absolute: 0.4 10*3/uL (ref 0.1–1.0)
Monocytes Relative: 3 %
Neutro Abs: 10.6 10*3/uL — ABNORMAL HIGH (ref 1.7–7.7)
Neutrophils Relative %: 85 %
Platelets: 316 10*3/uL (ref 150–400)
RBC: 2.85 MIL/uL — ABNORMAL LOW (ref 3.87–5.11)
RDW: 20.7 % — ABNORMAL HIGH (ref 11.5–15.5)
WBC: 12.5 10*3/uL — ABNORMAL HIGH (ref 4.0–10.5)
nRBC: 0.4 % — ABNORMAL HIGH (ref 0.0–0.2)

## 2023-05-27 LAB — CBG MONITORING, ED: Glucose-Capillary: 117 mg/dL — ABNORMAL HIGH (ref 70–99)

## 2023-05-27 LAB — I-STAT VENOUS BLOOD GAS, ED
Acid-Base Excess: 4 mmol/L — ABNORMAL HIGH (ref 0.0–2.0)
Bicarbonate: 29.4 mmol/L — ABNORMAL HIGH (ref 20.0–28.0)
Calcium, Ion: 1.23 mmol/L (ref 1.15–1.40)
HCT: 29 % — ABNORMAL LOW (ref 36.0–46.0)
Hemoglobin: 9.9 g/dL — ABNORMAL LOW (ref 12.0–15.0)
O2 Saturation: 45 %
Potassium: 3.9 mmol/L (ref 3.5–5.1)
Sodium: 136 mmol/L (ref 135–145)
TCO2: 31 mmol/L (ref 22–32)
pCO2, Ven: 46.4 mm[Hg] (ref 44–60)
pH, Ven: 7.409 (ref 7.25–7.43)
pO2, Ven: 25 mm[Hg] — CL (ref 32–45)

## 2023-05-27 LAB — COMPREHENSIVE METABOLIC PANEL
ALT: 25 U/L (ref 0–44)
AST: 59 U/L — ABNORMAL HIGH (ref 15–41)
Albumin: 2.4 g/dL — ABNORMAL LOW (ref 3.5–5.0)
Alkaline Phosphatase: 124 U/L (ref 38–126)
Anion gap: 19 — ABNORMAL HIGH (ref 5–15)
BUN: 19 mg/dL (ref 6–20)
CO2: 24 mmol/L (ref 22–32)
Calcium: 10 mg/dL (ref 8.9–10.3)
Chloride: 94 mmol/L — ABNORMAL LOW (ref 98–111)
Creatinine, Ser: 3.48 mg/dL — ABNORMAL HIGH (ref 0.44–1.00)
GFR, Estimated: 16 mL/min — ABNORMAL LOW (ref >60)
Glucose, Bld: 111 mg/dL — ABNORMAL HIGH (ref 70–99)
Potassium: 4 mmol/L (ref 3.5–5.1)
Sodium: 137 mmol/L (ref 135–145)
Total Bilirubin: 1 mg/dL (ref 0.3–1.2)
Total Protein: 7.5 g/dL (ref 6.5–8.1)

## 2023-05-27 LAB — I-STAT CHEM 8, ED
BUN: 20 mg/dL (ref 6–20)
Calcium, Ion: 1.24 mmol/L (ref 1.15–1.40)
Chloride: 95 mmol/L — ABNORMAL LOW (ref 98–111)
Creatinine, Ser: 3.7 mg/dL — ABNORMAL HIGH (ref 0.44–1.00)
Glucose, Bld: 113 mg/dL — ABNORMAL HIGH (ref 70–99)
HCT: 29 % — ABNORMAL LOW (ref 36.0–46.0)
Hemoglobin: 9.9 g/dL — ABNORMAL LOW (ref 12.0–15.0)
Potassium: 3.9 mmol/L (ref 3.5–5.1)
Sodium: 137 mmol/L (ref 135–145)
TCO2: 27 mmol/L (ref 22–32)

## 2023-05-27 LAB — I-STAT CG4 LACTIC ACID, ED: Lactic Acid, Venous: 2.4 mmol/L (ref 0.5–1.9)

## 2023-05-27 LAB — TROPONIN I (HIGH SENSITIVITY): Troponin I (High Sensitivity): 22 ng/L — ABNORMAL HIGH (ref <18)

## 2023-05-27 LAB — GLUCOSE, CAPILLARY
Glucose-Capillary: 336 mg/dL — ABNORMAL HIGH (ref 70–99)
Glucose-Capillary: 405 mg/dL — ABNORMAL HIGH (ref 70–99)
Glucose-Capillary: 444 mg/dL — ABNORMAL HIGH (ref 70–99)

## 2023-05-27 LAB — MAGNESIUM: Magnesium: 1.7 mg/dL (ref 1.7–2.4)

## 2023-05-27 MED ORDER — FENTANYL CITRATE PF 50 MCG/ML IJ SOSY
25.0000 ug | PREFILLED_SYRINGE | Freq: Once | INTRAMUSCULAR | Status: AC
  Administered 2023-05-27: 25 ug via INTRAVENOUS

## 2023-05-27 MED ORDER — LORAZEPAM 2 MG/ML IJ SOLN
1.0000 mg | Freq: Once | INTRAMUSCULAR | Status: AC
  Administered 2023-06-12: 1 mg via INTRAVENOUS
  Filled 2023-05-27 (×2): qty 1

## 2023-05-27 MED ORDER — POLYETHYLENE GLYCOL 3350 17 G PO PACK
17.0000 g | PACK | Freq: Every day | ORAL | Status: DC | PRN
  Filled 2023-05-27: qty 1

## 2023-05-27 MED ORDER — FENTANYL CITRATE PF 50 MCG/ML IJ SOSY
PREFILLED_SYRINGE | INTRAMUSCULAR | Status: AC
  Filled 2023-05-27: qty 1

## 2023-05-27 MED ORDER — DOCUSATE SODIUM 100 MG PO CAPS: 100.0000 mg | ORAL_CAPSULE | Freq: Two times a day (BID) | ORAL | Status: DC | PRN

## 2023-05-27 MED ORDER — VASOPRESSIN 20 UNITS/100 ML INFUSION FOR SHOCK
INTRAVENOUS | Status: AC
  Administered 2023-05-27: 20 [IU]
  Filled 2023-05-27: qty 100

## 2023-05-27 MED ORDER — INSULIN ASPART 100 UNIT/ML IJ SOLN
0.0000 [IU] | INTRAMUSCULAR | Status: DC
  Administered 2023-05-27: 15 [IU] via SUBCUTANEOUS
  Administered 2023-05-28 (×2): 2 [IU] via SUBCUTANEOUS
  Administered 2023-05-28: 11 [IU] via SUBCUTANEOUS
  Administered 2023-05-29 – 2023-05-30 (×3): 2 [IU] via SUBCUTANEOUS

## 2023-05-27 MED ORDER — CHLORHEXIDINE GLUCONATE CLOTH 2 % EX PADS
6.0000 | MEDICATED_PAD | Freq: Every day | CUTANEOUS | Status: DC
  Administered 2023-05-27 – 2023-06-05 (×10): 6 via TOPICAL

## 2023-05-27 MED ORDER — VANCOMYCIN HCL IN DEXTROSE 1-5 GM/200ML-% IV SOLN
1000.0000 mg | Freq: Once | INTRAVENOUS | Status: AC
  Administered 2023-05-27: 1000 mg via INTRAVENOUS
  Filled 2023-05-27: qty 200

## 2023-05-27 MED ORDER — SODIUM CHLORIDE 0.9 % IV SOLN
1.0000 g | Freq: Once | INTRAVENOUS | Status: AC
  Administered 2023-05-27: 1 g via INTRAVENOUS
  Filled 2023-05-27: qty 10

## 2023-05-27 MED ORDER — VASOPRESSIN 20 UNITS/100 ML INFUSION FOR SHOCK
0.0000 [IU]/min | INTRAVENOUS | Status: DC
  Administered 2023-05-27 – 2023-05-29 (×6): 0.03 [IU]/min via INTRAVENOUS
  Filled 2023-05-27 (×5): qty 100

## 2023-05-27 MED ORDER — SODIUM CHLORIDE 0.9 % IV BOLUS
1000.0000 mL | Freq: Once | INTRAVENOUS | Status: AC
  Administered 2023-05-27: 1000 mL via INTRAVENOUS

## 2023-05-27 MED ORDER — METRONIDAZOLE 500 MG/100ML IV SOLN
500.0000 mg | Freq: Once | INTRAVENOUS | Status: AC
  Administered 2023-05-27: 500 mg via INTRAVENOUS
  Filled 2023-05-27: qty 100

## 2023-05-27 MED ORDER — FENTANYL CITRATE PF 50 MCG/ML IJ SOSY
12.5000 ug | PREFILLED_SYRINGE | INTRAMUSCULAR | Status: DC | PRN
  Administered 2023-05-27 – 2023-05-28 (×2): 25 ug via INTRAVENOUS
  Filled 2023-05-27 (×2): qty 1

## 2023-05-27 MED ORDER — NOREPINEPHRINE 4 MG/250ML-% IV SOLN
0.0000 ug/min | INTRAVENOUS | Status: DC
  Administered 2023-05-27 (×2): 40 ug/min via INTRAVENOUS
  Administered 2023-05-27: 15 ug/min via INTRAVENOUS
  Filled 2023-05-27 (×3): qty 250

## 2023-05-27 MED ORDER — ORAL CARE MOUTH RINSE: 15.0000 mL | OROMUCOSAL | Status: DC | PRN

## 2023-05-27 MED ORDER — HEPARIN SODIUM (PORCINE) 5000 UNIT/ML IJ SOLN
5000.0000 [IU] | Freq: Three times a day (TID) | INTRAMUSCULAR | Status: DC
  Administered 2023-05-27 – 2023-06-17 (×59): 5000 [IU] via SUBCUTANEOUS
  Filled 2023-05-27 (×59): qty 1

## 2023-05-27 MED ORDER — NOREPINEPHRINE 16 MG/250ML-% IV SOLN
0.0000 ug/min | INTRAVENOUS | Status: DC
  Administered 2023-05-27: 39 ug/min via INTRAVENOUS
  Administered 2023-05-28: 15 ug/min via INTRAVENOUS
  Administered 2023-05-28: 14 ug/min via INTRAVENOUS
  Filled 2023-05-27 (×3): qty 250

## 2023-05-27 MED ORDER — IOHEXOL 350 MG/ML SOLN
60.0000 mL | Freq: Once | INTRAVENOUS | Status: AC | PRN
  Administered 2023-05-27: 60 mL via INTRAVENOUS

## 2023-05-27 NOTE — Plan of Care (Signed)
I tried to reach both of patient's emergency contacts but did not get an answer.   [name] Jomel Whittlesey, PA-C

## 2023-05-27 NOTE — Progress Notes (Signed)
ED Pharmacy Antibiotic Sign Off An antibiotic consult was received from an ED provider for Cefepime and Vancomycin per pharmacy dosing for abdominal wound infection, open abdomen. A chart review was completed to assess appropriateness.   The following one time order(s) were placed:  Cefepime 2g IV x1 Vancomycin 1000mg  IV x1  Further antibiotic and/or antibiotic pharmacy consults should be ordered by the admitting provider if indicated.   Thank you for allowing pharmacy to be a part of this patient's care.    Wilburn Cornelia, PharmD, BCPS Clinical Pharmacist 05/27/2023 3:16 PM   Please refer to [facility] for pharmacy phone number

## 2023-05-27 NOTE — H&P (Addendum)
NAME:  [name], MRN:  [id number], DOB:  02/24/1981, LOS: 0 ADMISSION DATE:  05/29/2023, CONSULTATION DATE:  05/22/2023 REFERRING MD: EDP, CHIEF COMPLAINT:  septic shock  History of Present Illness:  [name] is a 42 year old woman with insulin-dependent diabetes mellitus, resultant end-stage renal disease, bilateral AKA, blindness and severe peripheral arterial disease.  She presents hypotensive from dialysis today.  She had a recent prolonged hospital stay in September 2024 for small bowel ischemia with resultant small bowel obstruction.  She had an extended ileal cecotomy placed.  She was discharged 3 days ago to SNF.  Of note her AV fistula cannot be accessed due to pseudoaneurysm and repeated clotting.  She thus has a dialysis catheter placed in the right chest.  There is report that she was not adherent to dressing changes for her open abdominal wound, and did not agree to wound VAC.  She was found to be hypotensive in the ED and started on 2 vasopressors.  PCCM consulted for admission.  History is limited and obtained primarily from chart review and discussion with EDP.  Pertinent  Medical History  IDDM ESRD Bilateral AKA Blindness Severe PAD and recd  Significant Hospital Events: Including procedures, antibiotic start and stop dates in addition to other pertinent events     Interim History / Subjective:    Objective   Blood pressure 92/65, resp. rate 14.        Intake/Output Summary (Last 24 hours) at 06/06/2023 1824 Last data filed at 05/28/2023 1552 Gross per 24 hour  Intake 1179.62 ml  Output --  Net 1179.62 ml   There were no vitals filed for this visit.  Examination: General: chronically ill appearing, older than stated age HENT: dry mucus membranes Lungs: breathing non labored, lungs clear Cardiovascular: tachycardic, regular Abdomen: open abdominal wound ventral incision, cecotomy pouch in place, there is stool leaking into the open abdomen with  dirty dressings and stool everywhere. Extremities: bilateral AKA Neuro: awake, blind, not oriented to situation GU: cecotomy. In diaper  CT head shows concern for right greater than left subacute infarct or hemorrhage.  MRI brain recommended.  Ct chest abdomen pelvis read pending  No lactic acidosis CMET reviewed Cr 3.48 lytes ok pretty unremarkable Mild troponin elevation WBC 12.5 with left shift Hgb 7.5 repeat up to 9.9 Resolved Hospital Problem list     Assessment & Plan:  Septic Shock secondary to suspected intra-abdominal infection -Continue Vanco cefepime Flagyl given Penicillin allergy.  Probably reasonable to try pip-tazo at some point if needed. -Her baseline blood pressure is likely not much of her systolic of 80.  Will need to place arterial line today with femoral Given 2 failed radial times.  She is on nor epi and vasopressin.  Goal MAP over 65.  -Awaiting read from CT chest abdomen pelvis.  May likely need surgical consult pending results.  End-stage renal disease, likely diabetic nephropathy -Has HD catheter in place given AV fistula is currently not functional -Nephrology will need to be consulted in am. No urgent RRT need had a half run today.   Type 2 diabetes Severe peripheral or vascular disease Recent small bowel ischemia Bilateral AKA -Continue home Semglee, sliding scale insulin  Acute encephalopathy Possible acute infarct Probably secondary to sepsis but CT head shows possible acute infarct.  Will order MRI brain.  'm not sure she'll be able to be still for MRI. It is ordered with IV ativan for sedation. Sepsis takes priority at this time. We are consulting  neurology at this time.    Best Practice (right click and "Reselect all SmartList Selections" daily)   Diet/type: NPO DVT prophylaxis: prophylactic heparin  GI prophylaxis: N/A Lines: Dialysis Catheter Foley:  N/A Code Status:  full code Last date of multidisciplinary goals of care discussion  [pending]  Labs   CBC: Recent Labs  Lab 05/21/23 0356 05/22/23 0450 05/24/23 0549 05/25/23 1159 05/23/2023 1510 05/16/2023 1511 05/31/2023 1514  WBC 17.7* 14.9* 10.5 12.6* 12.5*  --   --   NEUTROABS  --   --  7.2 10.3* 10.6*  --   --   HGB 8.2* 7.6* 9.4* 8.5* 7.5* 9.9* 9.9*  HCT 27.1* 25.5* 31.1* 30.2* 25.3* 29.0* 29.0*  MCV 86.6 87.3 89.1 91.5 88.8  --   --   PLT 334 359 334 349 316  --   --     Basic Metabolic Panel: Recent Labs  Lab 05/21/23 0356 05/22/23 0450 05/24/23 0549 05/25/23 1159 06/01/2023 1510 06/01/2023 1511 05/31/2023 1514  NA 132* 133* 133* 134* 137 136 137  K 4.0 4.3 4.2 4.5 4.0 3.9 3.9  CL 92* 93* 94* 91* 94*  --  95*  CO2 24 24 25  20* 24  --   --   GLUCOSE 92 120* 175* 282* 111*  --  113*  BUN 17 28* 20 18 19   --  20  CREATININE 3.80* 5.18* 4.22* 3.98* 3.48*  --  3.70*  CALCIUM 9.2 9.9 10.8* 11.7* 10.0  --   --   MG  --   --   --   --  1.7  --   --   PHOS  --   --  5.1*  --   --   --   --    GFR: Estimated Creatinine Clearance: 15.1 mL/min (A) (by C-G formula based on SCr of 3.7 mg/dL (H)). Recent Labs  Lab 05/22/23 0450 05/24/23 0549 05/25/23 1159 05/25/23 1204 05/25/23 1510 05/19/2023 1510 05/18/2023 1513  WBC 14.9* 10.5 12.6*  --   --  12.5*  --   LATICACIDVEN  --   --   --  2.8* 1.2  --  2.4*    Liver Function Tests: Recent Labs  Lab 05/24/23 0549 05/25/23 1159 06/08/2023 1510  AST  --  14* 59*  ALT  --  13 25  ALKPHOS  --  101 124  BILITOT  --  1.0 1.0  PROT  --  8.4* 7.5  ALBUMIN 2.4* 2.8* 2.4*   Recent Labs  Lab 05/25/23 1159  LIPASE 55*   No results for input(s): "AMMONIA" in the last 168 hours.  ABG    Component Value Date/Time   PHART 7.440 09/22/2021 1134   PCO2ART 34.8 09/22/2021 1134   PO2ART 63.6 (L) 09/22/2021 1134   HCO3 29.4 (H) 05/29/2023 1511   TCO2 27 05/20/2023 1514   ACIDBASEDEF 0.4 09/22/2021 1134   O2SAT 45 06/04/2023 1511     Coagulation Profile: No results for input(s): "INR", "PROTIME" in the last  168 hours.  Cardiac Enzymes: No results for input(s): "CKTOTAL", "CKMB", "CKMBINDEX", "TROPONINI" in the last 168 hours.  HbA1C: Hgb A1c MFr Bld  Date/Time Value Ref Range Status  04/30/2023 09:30 AM 5.7 (H) 4.8 - 5.6 % Final    Comment:    (NOTE) Pre diabetes:          5.7%-6.4%  Diabetes:              >6.4%  Glycemic control for   <  7.0% adults with diabetes   12/20/2022 01:40 PM 8.2 (H) 4.8 - 5.6 % Final    Comment:    (NOTE) Pre diabetes:          5.7%-6.4%  Diabetes:              >6.4%  Glycemic control for   <7.0% adults with diabetes     CBG: Recent Labs  Lab 05/23/23 1537 05/23/23 1959 05/24/23 0734 05/24/23 1756 05/28/2023 1506  GLUCAP 129* 140* 162* 170* 117*    Review of Systems:   +abdominal pain  Past Medical History:  She,  has a past medical history of Anemia, Anxiety, Blind right eye (2008), Diabetes mellitus without complication (HCC), Embolic stroke (HCC), ESRD (end stage renal disease) (HCC), ESRD on hemodialysis (HCC), and GBS (Guillain Barre syndrome) (HCC).   Surgical History:   Past Surgical History:  Procedure Laterality Date   AMPUTATION Right 08/11/2021   Procedure: TRANSMETATARSAL AMPUTATION OF RIGHT FOOT;  Surgeon: Nada Libman, MD;  Location: [facility] OR;  Service: Vascular;  Laterality: Right;   AMPUTATION Right 09/17/2021   Procedure: AMPUTATION RIGHT BELOW KNEE;  Surgeon: Nada Libman, MD;  Location: [facility] OR;  Service: Vascular;  Laterality: Right;   AMPUTATION Right 10/11/2021   Procedure: RIGHT ABOVE KNEE AMPUTATION;  Surgeon: Nada Libman, MD;  Location: [facility] OR;  Service: Vascular;  Laterality: Right;   AMPUTATION Left 07/02/2022   Procedure: LEFT ABOVE KNEE AMPUTATION;  Surgeon: Chuck Hint, MD;  Location: [facility] OR;  Service: Vascular;  Laterality: Left;   AMPUTATION TOE Left    APPLICATION OF WOUND VAC  08/16/2021   Procedure: APPLICATION OF WOUND VAC;  Surgeon: Nada Libman, MD;  Location: [facility] OR;  Service:  Vascular;;   AV FISTULA PLACEMENT     BUBBLE STUDY  10/02/2021   Procedure: BUBBLE STUDY;  Surgeon: Pricilla Riffle, MD;  Location: [facility] ENDOSCOPY;  Service: Cardiovascular;;   CESAREAN SECTION  2011   COLOSTOMY Right 05/04/2023   Procedure: COLOSTOMY;  Surgeon: Moise Boring, MD;  Location[facility] OR;  Service: General;  Laterality: Right;   FISTULA SUPERFICIALIZATION Right 07/27/2019   Procedure: 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;  Surgeon: Chuck Hint, MD;  Location[facility] OR;  Service: Vascular;  Laterality: Right;   INSERTION OF DIALYSIS CATHETER Left 07/27/2019   Procedure: INSERTION OF TUNNELED  DIALYSIS CATHETER;  Surgeon: Chuck Hint, MD;  Location: [facility] OR;  Service: Vascular;  Laterality: Left;   INSERTION OF DIALYSIS CATHETER Left 10/23/2021   Procedure: INSERTION OF TUNNELED PALINDROME PRECISION DIALYSIS CATHETER (23cm);  Surgeon: Cephus Shelling, MD;  Location: [facility] OR;  Service: Vascular;  Laterality: Left;   IR AV DIALY SHUNT INTRO NEEDLE/INTRACATH INITIAL W/PTA/IMG RIGHT Right 05/14/2023   IR FLUORO GUIDE CV LINE LEFT  10/16/2021   IR FLUORO GUIDE CV LINE LEFT  05/10/2023   IR FLUORO GUIDE CV LINE RIGHT  10/11/2021   IR GASTROSTOMY TUBE MOD SED  10/30/2021   IR GASTROSTOMY TUBE REMOVAL  05/08/2022   IR US GUIDE VASC ACCESS LEFT  10/16/2021   IR US GUIDE VASC ACCESS LEFT  05/10/2023   IR US GUIDE VASC ACCESS RIGHT  10/11/2021   IR US GUIDE VASC ACCESS RIGHT  05/14/2023   LAPAROSCOPY N/A 05/04/2023   Procedure: LAPAROSCOPY DIAGNOSTIC;  Surgeon: Moise Boring, MD;  Location: [facility] OR;  Service: General;  Laterality: N/A;   LAPAROTOMY N/A 05/04/2023   Procedure: EXPLORATION  LAPAROTOMY WITH ILEO-CAECAL RESECTION;  Surgeon: Moise Boring, MD;  Location: [facility] OR;  Service: General;  Laterality: N/A;   LEFT HEART CATH AND CORONARY ANGIOGRAPHY N/A 07/26/2022   Procedure: LEFT HEART CATH AND CORONARY ANGIOGRAPHY;  Surgeon: Swaziland, Peter M, MD;  Location: [facility]  INVASIVE CV LAB;  Service: Cardiovascular;  Laterality: N/A;   LOWER EXTREMITY ANGIOGRAPHY N/A 08/11/2021   Procedure: LOWER EXTREMITY ANGIOGRAPHY;  Surgeon: Nada Libman, MD;  Location: [facility] INVASIVE CV LAB;  Service: Cardiovascular;  Laterality: N/A;   REVISON OF ARTERIOVENOUS FISTULA Right 10/23/2021   Procedure: REVISON OF ARTERIOVENOUS FISTULA  ARM AND PLICATION;  Surgeon: Cephus Shelling, MD;  Location: [facility] OR;  Service: Vascular;  Laterality: Right;   TEE WITHOUT CARDIOVERSION N/A 10/02/2021   Procedure: TRANSESOPHAGEAL ECHOCARDIOGRAM (TEE);  Surgeon: Pricilla Riffle, MD;  Location: [facility] ENDOSCOPY;  Service: Cardiovascular;  Laterality: N/A;   TRANSMETATARSAL AMPUTATION Right 08/16/2021   Procedure: CLOSURE OF RIGHT TRANSMETATARSAL AMPUTATION;  Surgeon: Nada Libman, MD;  Location: [facility] OR;  Service: Vascular;  Laterality: Right;     Social History:   reports that she has never smoked. She has never used smokeless tobacco. She reports that she does not drink alcohol and does not use drugs.   Family History:  Her family history includes Diabetes in her father and mother.   Allergies Allergies  Allergen Reactions   Morphine Rash and Other (See Comments)    Per patient MAR.   Peanut-Containing Drug Products Anaphylaxis and Hives   Penicillins Rash and Other (See Comments)    Rash in 2008.  Tolerated cefazolin in 2020. No reaction listed on MAR.   Shellfish Allergy Swelling   Chocolate Hives     Home Medications  Prior to Admission medications   Medication Sig Start Date End Date Taking? Authorizing Provider  Accu-Chek FastClix Lancets MISC Use as instructed to check blood sugar up to TID. E11.22 Patient taking differently: 1 each by Other route See admin instructions. Use as instructed to check blood sugar up to TID. E11.22 04/15/19   Hoy Register, MD  acetaminophen (TYLENOL) 325 MG tablet Take 650 mg by mouth every 6 (six) hours as needed for fever or mild pain.    [provider]  atorvastatin (LIPITOR) 40 MG tablet Place 1 tablet (40 mg total) into feeding tube daily. Patient taking differently: Take 40 mg by mouth daily. 10/26/21   Burnadette Pop, MD  azelastine (OPTIVAR) 0.05 % ophthalmic solution Place 1 drop into both eyes 2 (two) times daily as needed (allergies). 11/13/22   [provider]  cetirizine (ZYRTEC) 10 MG tablet Take 10 mg by mouth daily.    [provider]  Continuous Blood Gluc Transmit (DEXCOM G6 TRANSMITTER) MISC 1 Device by Does not apply route as directed. 09/12/20   Shamleffer, Konrad Dolores, MD  Dorzolamide HCl-Timolol Mal PF 2-0.5 % SOLN Place 1 drop into the left eye in the morning and at bedtime.    [provider]  dorzolamide-timolol (COSOPT) 22.3-6.8 MG/ML ophthalmic solution Place 1 drop into the left eye 2 (two) times daily. 03/30/21   [provider]  dronabinol (MARINOL) 5 MG capsule Take 1 capsule (5 mg total) by mouth 2 (two) times daily before lunch and supper. 05/24/23   Rhetta Mura, MD  glucagon, human recombinant, (GLUCAGEN DIAGNOSTIC) 1 MG injection Inject 1 mg into the vein once as needed for low blood sugar.    [provider]  glucose blood (ACCU-CHEK GUIDE)  test strip Use as instructed to check blood sugar up to TID. E11.22 Patient taking differently: 1 each by Other route See admin instructions. Use as instructed to check blood sugar up to TID. E11.22 07/17/19   Fulp, Cammie, MD  HYDROcodone-acetaminophen (NORCO/VICODIN) 5-325 MG tablet Take 1 tablet by mouth every 6 (six) hours as needed for moderate pain. 05/24/23   Rhetta Mura, MD  insulin aspart (FIASP FLEXTOUCH) 100 UNIT/ML FlexTouch Pen Inject 1-10 Units into the skin 4 (four) times daily -  before meals and at bedtime. Inject as per sliding scale: If 121-150= 1 units call MD if blood sugar <70 or 400>; 151-200= 2 units 201-250= 3 units 251-300= 5 units 301-350= 7 units 351-400= 10  units Subcutaneously before meals and at bedtime.    [provider]  insulin glargine (SEMGLEE) 100 UNIT/ML injection Inject 0.1 mLs (10 Units total) into the skin daily at 6 PM. Patient taking differently: Inject 10 Units into the skin daily. 12/28/22   Lorin Glass, MD  lanthanum (FOSRENOL) 1000 MG chewable tablet Chew 1 tablet (1,000 mg total) by mouth 3 (three) times daily with meals. 05/24/23   Rhetta Mura, MD  loperamide (IMODIUM) 2 MG capsule Take 1 capsule (2 mg total) by mouth every 6 (six) hours as needed for diarrhea or loose stools. 05/24/23   Rhetta Mura, MD  midodrine (PROAMATINE) 10 MG tablet Take 1 tablet (10 mg total) by mouth 3 (three) times daily with meals. 05/24/23   Rhetta Mura, MD  Multiple Vitamin (MULTIVITAMIN WITH MINERALS) TABS tablet Take 1 tablet by mouth daily.    [provider]  Nutritional Supplements (NUTRITIONAL DRINK) LIQD Take 120 mLs by mouth daily.    [provider]  ondansetron (ZOFRAN) 4 MG tablet Take 4 mg by mouth every 4 (four) hours as needed for nausea/vomiting. 03/02/22   [provider]  polycarbophil (FIBERCON) 625 MG tablet Take 2 tablets (1,250 mg total) by mouth 2 (two) times daily. 05/24/23   Rhetta Mura, MD  thiamine 100 MG tablet Place 1 tablet (100 mg total) into feeding tube daily. Patient taking differently: Take 100 mg by mouth daily. 10/26/21   Burnadette Pop, MD     Critical care time: 45     The patient is critically ill due to septic shock.  Critical care was necessary to treat or prevent imminent or life-threatening deterioration.  Critical care was time spent personally by me on the following activities: development of treatment plan with patient and/or surrogate as well as nursing, discussions with consultants, evaluation of patient's response to treatment, examination of patient, obtaining history from patient or surrogate, ordering and performing treatments  and interventions, ordering and review of laboratory studies, ordering and review of radiographic studies, pulse oximetry, re-evaluation of patient's condition and participation in multidisciplinary rounds.   Critical Care Time devoted to patient care services described in this note is 65 minutes. This time reflects time of care of this signee Charlott Holler . This critical care time does not reflect separately billable procedures or procedure time, teaching time or supervisory time of PA/NP/Med student/Med Resident etc but could involve care discussion time.       Charlott Holler [facility] 05/19/2023 6:24 PM  Pager: see AMION  If no response to pager , please call critical care on call (see AMION) until 7pm After 7:00 pm call Elink

## 2023-05-27 NOTE — Consult Note (Signed)
NEUROLOGY CONSULT NOTE   Date of service: May 27, 2023 Patient Name: [name] MRN:  [id number] DOB:  01-23-1981 Chief Complaint: "AMS" Requesting Provider: Charlott Holler, MD  History of Present Illness  Niveah Nolle is a 42 y.o. female  has a past medical history of Anemia, Anxiety, Blind right eye (2008), Diabetes mellitus without complication (HCC), Embolic stroke (HCC), ESRD (end stage renal disease) (HCC), ESRD on hemodialysis (HCC), and GBS (Guillain Barre syndrome) (HCC). who presents with septic shock and altered mental status with abnormal head CT.  She has a history of previous stroke, ESRD vegan, diabetes, blindness and states that she feels like she has been more confused recently.  She is not sure exactly when this started.  She also has noticed right-sided weakness and numbness, but is also unclear when that started.   LKW: Unclear Modified rankin score: 4-Needs assistance to walk and tend to bodily needs IV Thrombolysis:  No unclear time of onset    Past History   Past Medical History:  Diagnosis Date   Anemia    Anxiety    Blind right eye 2008   Diabetes mellitus without complication (HCC)    Embolic stroke (HCC)    ESRD (end stage renal disease) (HCC)    HD on M/W/F   ESRD on hemodialysis (HCC)    GBS (Guillain Barre syndrome) (HCC)     Past Surgical History:  Procedure Laterality Date   AMPUTATION Right 08/11/2021   Procedure: TRANSMETATARSAL AMPUTATION OF RIGHT FOOT;  Surgeon: Nada Libman, MD;  Location: [facility] OR;  Service: Vascular;  Laterality: Right;   AMPUTATION Right 09/17/2021   Procedure: AMPUTATION RIGHT BELOW KNEE;  Surgeon: Nada Libman, MD;  Location: [facility] OR;  Service: Vascular;  Laterality: Right;   AMPUTATION Right 10/11/2021   Procedure: RIGHT ABOVE KNEE AMPUTATION;  Surgeon: Nada Libman, MD;  Location: [facility] OR;  Service: Vascular;  Laterality: Right;   AMPUTATION Left 07/02/2022   Procedure: LEFT ABOVE KNEE AMPUTATION;  Surgeon:  Chuck Hint, MD;  Location: [facility] OR;  Service: Vascular;  Laterality: Left;   AMPUTATION TOE Left    APPLICATION OF WOUND VAC  08/16/2021   Procedure: APPLICATION OF WOUND VAC;  Surgeon: Nada Libman, MD;  Location: [facility] OR;  Service: Vascular;;   AV FISTULA PLACEMENT     BUBBLE STUDY  10/02/2021   Procedure: BUBBLE STUDY;  Surgeon: Pricilla Riffle, MD;  Location: [facility] ENDOSCOPY;  Service: Cardiovascular;;   CESAREAN SECTION  2011   COLOSTOMY Right 05/04/2023   Procedure: COLOSTOMY;  Surgeon: Moise Boring, MD;  Location: [facility] OR;  Service: General;  Laterality: Right;   FISTULA SUPERFICIALIZATION Right 07/27/2019   Procedure: 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;  Surgeon: Chuck Hint, MD;  Location: [facility] OR;  Service: Vascular;  Laterality: Right;   INSERTION OF DIALYSIS CATHETER Left 07/27/2019   Procedure: INSERTION OF TUNNELED  DIALYSIS CATHETER;  Surgeon: Chuck Hint, MD;  Location: [facility] OR;  Service: Vascular;  Laterality: Left;   INSERTION OF DIALYSIS CATHETER Left 10/23/2021   Procedure: INSERTION OF TUNNELED PALINDROME PRECISION DIALYSIS CATHETER (23cm);  Surgeon: Cephus Shelling, MD;  Location: [facility] OR;  Service: Vascular;  Laterality: Left;   IR AV DIALY SHUNT INTRO NEEDLE/INTRACATH INITIAL W/PTA/IMG RIGHT Right 05/14/2023   IR FLUORO GUIDE CV LINE LEFT  10/16/2021   IR FLUORO GUIDE CV LINE LEFT  05/10/2023   IR FLUORO GUIDE CV LINE RIGHT  10/11/2021  IR GASTROSTOMY TUBE MOD SED  10/30/2021   IR GASTROSTOMY TUBE REMOVAL  05/08/2022   IR US GUIDE VASC ACCESS LEFT  10/16/2021   IR US GUIDE VASC ACCESS LEFT  05/10/2023   IR US GUIDE VASC ACCESS RIGHT  10/11/2021   IR US GUIDE VASC ACCESS RIGHT  05/14/2023   LAPAROSCOPY N/A 05/04/2023   Procedure: LAPAROSCOPY DIAGNOSTIC;  Surgeon: Moise Boring, MD;  Location: [facility] OR;  Service: General;  Laterality: N/A;   LAPAROTOMY N/A 05/04/2023   Procedure: EXPLORATION LAPAROTOMY WITH ILEO-CAECAL RESECTION;   Surgeon: Moise Boring, MD;  Location: [facility] OR;  Service: General;  Laterality: N/A;   LEFT HEART CATH AND CORONARY ANGIOGRAPHY N/A 07/26/2022   Procedure: LEFT HEART CATH AND CORONARY ANGIOGRAPHY;  Surgeon: Swaziland, Peter M, MD;  Location: [facility] INVASIVE CV LAB;  Service: Cardiovascular;  Laterality: N/A;   LOWER EXTREMITY ANGIOGRAPHY N/A 08/11/2021   Procedure: LOWER EXTREMITY ANGIOGRAPHY;  Surgeon: Nada Libman, MD;  Location: [facility] INVASIVE CV LAB;  Service: Cardiovascular;  Laterality: N/A;   REVISON OF ARTERIOVENOUS FISTULA Right 10/23/2021   Procedure: REVISON OF ARTERIOVENOUS FISTULA  ARM AND PLICATION;  Surgeon: Cephus Shelling, MD;  Location: [facility] OR;  Service: Vascular;  Laterality: Right;   TEE WITHOUT CARDIOVERSION N/A 10/02/2021   Procedure: TRANSESOPHAGEAL ECHOCARDIOGRAM (TEE);  Surgeon: Pricilla Riffle, MD;  Location: [facility] ENDOSCOPY;  Service: Cardiovascular;  Laterality: N/A;   TRANSMETATARSAL AMPUTATION Right 08/16/2021   Procedure: CLOSURE OF RIGHT TRANSMETATARSAL AMPUTATION;  Surgeon: Nada Libman, MD;  Location: [facility] OR;  Service: Vascular;  Laterality: Right;    Family History: Family History  Problem Relation Age of Onset   Diabetes Mother    Diabetes Father     Social History  reports that she has never smoked. She has never used smokeless tobacco. She reports that she does not drink alcohol and does not use drugs.  Allergies  Allergen Reactions   Morphine Rash and Other (See Comments)    Per patient MAR.   Peanut-Containing Drug Products Anaphylaxis and Hives   Penicillins Rash and Other (See Comments)    Rash in 2008.  Tolerated cefazolin in 2020. No reaction listed on MAR.   Shellfish Allergy Swelling   Chocolate Hives    Medications   Current Facility-Administered Medications:    docusate sodium (COLACE) capsule 100 mg, 100 mg, Oral, BID PRN, Charlott Holler, MD   heparin injection 5,000 Units, 5,000 Units, Subcutaneous, Q8H, Charlott Holler, MD    LORazepam (ATIVAN) injection 1 mg, 1 mg, Intravenous, Once, Charlott Holler, MD   metroNIDAZOLE (FLAGYL) IVPB 500 mg, 500 mg, Intravenous, Once, Doristine Counter, RPH, Last Rate: 100 mL/hr at 06/04/2023 1822, 500 mg at 05/19/2023 1822   norepinephrine (LEVOPHED) 4mg  in (0.016 mg/mL) premix infusion, 0-40 mcg/min, Intravenous, Titrated, Lyman Speller, MD, Last Rate: 150 mL/hr at 06/08/2023 1840, 40 mcg/min at 05/25/2023 1840   polyethylene glycol (MIRALAX / GLYCOLAX) packet 17 g, 17 g, Oral, Daily PRN, Charlott Holler, MD   vancomycin (VANCOCIN) IVPB 1000 mg/200 mL premix, 1,000 mg, Intravenous, Once, Doristine Counter, RPH, Last Rate: 200 mL/hr at 05/29/2023 1821, 1,000 mg at 06/11/2023 1821   vasopressin (PITRESSIN) 20 Units in 100 mL (0.2 unit/mL) infusion-*FOR SHOCK*, 0-0.03 Units/min, Intravenous, Continuous, Cathren Laine, MD, Last Rate: 9 mL/hr at 05/23/2023 1551, 0.03 Units/min at 06/05/2023 1551  Current Outpatient Medications:    Accu-Chek FastClix Lancets MISC, Use as instructed to check blood sugar up  to TID. E11.22 (Patient taking differently: 1 each by Other route See admin instructions. Use as instructed to check blood sugar up to TID. E11.22), Disp: 102 each, Rfl: 11   acetaminophen (TYLENOL) 325 MG tablet, Take 650 mg by mouth every 6 (six) hours as needed for fever or mild pain., Disp: , Rfl:    atorvastatin (LIPITOR) 40 MG tablet, Place 1 tablet (40 mg total) into feeding tube daily. (Patient taking differently: Take 40 mg by mouth daily.), Disp: , Rfl:    azelastine (OPTIVAR) 0.05 % ophthalmic solution, Place 1 drop into both eyes 2 (two) times daily as needed (allergies)., Disp: , Rfl:    cetirizine (ZYRTEC) 10 MG tablet, Take 10 mg by mouth daily., Disp: , Rfl:    Continuous Blood Gluc Transmit (DEXCOM G6 TRANSMITTER) MISC, 1 Device by Does not apply route as directed., Disp: 1 each, Rfl: 3   Dorzolamide HCl-Timolol Mal PF 2-0.5 % SOLN, Place 1 drop into the left eye in the morning and at  bedtime., Disp: , Rfl:    dorzolamide-timolol (COSOPT) 22.3-6.8 MG/ML ophthalmic solution, Place 1 drop into the left eye 2 (two) times daily., Disp: , Rfl:    dronabinol (MARINOL) 5 MG capsule, Take 1 capsule (5 mg total) by mouth 2 (two) times daily before lunch and supper., Disp: , Rfl:    glucagon, human recombinant, (GLUCAGEN DIAGNOSTIC) 1 MG injection, Inject 1 mg into the vein once as needed for low blood sugar., Disp: , Rfl:    glucose blood (ACCU-CHEK GUIDE) test strip, Use as instructed to check blood sugar up to TID. E11.22 (Patient taking differently: 1 each by Other route See admin instructions. Use as instructed to check blood sugar up to TID. E11.22), Disp: 100 each, Rfl: 12   HYDROcodone-acetaminophen (NORCO/VICODIN) 5-325 MG tablet, Take 1 tablet by mouth every 6 (six) hours as needed for moderate pain., Disp: 15 tablet, Rfl: 0   insulin aspart (FIASP FLEXTOUCH) 100 UNIT/ML FlexTouch Pen, Inject 1-10 Units into the skin 4 (four) times daily -  before meals and at bedtime. Inject as per sliding scale: If 121-150= 1 units call MD if blood sugar <70 or 400>; 151-200= 2 units 201-250= 3 units 251-300= 5 units 301-350= 7 units 351-400= 10 units Subcutaneously before meals and at bedtime., Disp: , Rfl:    insulin glargine (SEMGLEE) 100 UNIT/ML injection, Inject 0.1 mLs (10 Units total) into the skin daily at 6 PM. (Patient taking differently: Inject 10 Units into the skin daily.), Disp: 10 mL, Rfl: 11   lanthanum (FOSRENOL) 1000 MG chewable tablet, Chew 1 tablet (1,000 mg total) by mouth 3 (three) times daily with meals., Disp: , Rfl:    loperamide (IMODIUM) 2 MG capsule, Take 1 capsule (2 mg total) by mouth every 6 (six) hours as needed for diarrhea or loose stools., Disp: , Rfl:    midodrine (PROAMATINE) 10 MG tablet, Take 1 tablet (10 mg total) by mouth 3 (three) times daily with meals., Disp: , Rfl:    Multiple Vitamin (MULTIVITAMIN WITH MINERALS) TABS tablet, Take 1 tablet by mouth daily.,  Disp: , Rfl:    Nutritional Supplements (NUTRITIONAL DRINK) LIQD, Take 120 mLs by mouth daily., Disp: , Rfl:    ondansetron (ZOFRAN) 4 MG tablet, Take 4 mg by mouth every 4 (four) hours as needed for nausea/vomiting., Disp: , Rfl:    polycarbophil (FIBERCON) 625 MG tablet, Take 2 tablets (1,250 mg total) by mouth 2 (two) times daily., Disp: , Rfl:  thiamine 100 MG tablet, Place 1 tablet (100 mg total) into feeding tube daily. (Patient taking differently: Take 100 mg by mouth daily.), Disp: , Rfl:   Vitals   Vitals:   06/07/2023 1540 06/02/2023 1640 05/24/2023 1650 06/12/2023 1658  BP: (!) 141/87 (!) 70/31 (!) 75/26 92/65  Resp: 17 17 13 14     There is no height or weight on file to calculate BMI.  Physical Exam   She is in bed, asleep when I entered the room. Neurologic Examination    Neuro: Mental Status: Patient awakens easily and then is awake, alert.  She is able to tell me that it is October but not what year.  She has some perseveration, as well as some hesitancy of speech.  She has difficulty with repetition. Cranial Nerves: II: Pupils are not reactive, she has light perception in the left eye but not the right III,IV, VI: Eyes are slightly exotropic, but she is able to cross midline with both eyes in both directions V: Facial sensation is mildly diminished in the right VII: Facial movement is slightly weak on the right Motor: She has 4+/5 strength in the right upper extremity, 5/5 in the left, she is able to lift both legs relatively equally Sensory: Sensation is diminished throughout the right side     Labs   CBC:  Recent Labs  Lab 05/25/23 1159 05/22/2023 1510 06/04/2023 1511 05/28/2023 1514  WBC 12.6* 12.5*  --   --   NEUTROABS 10.3* 10.6*  --   --   HGB 8.5* 7.5* 9.9* 9.9*  HCT 30.2* 25.3* 29.0* 29.0*  MCV 91.5 88.8  --   --   PLT 349 316  --   --     Basic Metabolic Panel:  Lab Results  Component Value Date   NA 137 06/09/2023   K 3.9 05/16/2023   CO2 24  05/21/2023   GLUCOSE 113 (H) 05/15/2023   BUN 20 06/08/2023   CREATININE 3.70 (H) 06/12/2023   CALCIUM 10.0 06/10/2023   GFRNONAA 16 (L) 05/19/2023   GFRAA 6 (L) 05/18/2020   Lipid Panel:  Lab Results  Component Value Date   LDLCALC 72 07/24/2022   HgbA1c:  Lab Results  Component Value Date   HGBA1C 5.7 (H) 04/30/2023   Urine Drug Screen: No results found for: "LABOPIA", "COCAINSCRNUR", "LABBENZ", "AMPHETMU", "THCU", "LABBARB"  Alcohol Level     Component Value Date/Time   ETH <10 09/15/2021 1548   INR  Lab Results  Component Value Date   INR 1.9 (H) 05/14/2023   APTT  Lab Results  Component Value Date   APTT 32 04/30/2023   AED levels: No results found for: "PHENYTOIN", "ZONISAMIDE", "LAMOTRIGINE", "LEVETIRACETA"   CT Head without contrast(Personally reviewed): Unusual appearing laminar hyperdensity in multiple areas. Impression   [name] is a 42 y.o. female who presents with altered mental status in the setting of sepsis with abnormal head CT.  This could represent laminar necrosis, extensive cortically-based petechial hemorrhage, hemorrhagic posterior reversible encephalopathy syndrome.  An MRI would be very helpful in differentiating etiology of these changes.  Recommendations  1) MRI brain 2) further recommendations pending above. ______________________________________________________________________    Stormy Card

## 2023-05-27 NOTE — Procedures (Signed)
Arterial Catheter Insertion Procedure Note  Aniayah Alaniz  [id number]  1980/10/29  Date:05/27/23  Time:6:57 PM    Provider Performing: Darcella Gasman Nichele Slawson    Procedure: Insertion of Arterial Line (69629) with US guidance (52841)   Indication(s) Blood pressure monitoring and/or need for frequent ABGs  Consent Unable to obtain consent due to emergent nature of procedure.  Anesthesia None   Time Out Verified patient identification, verified procedure, site/side was marked, verified correct patient position, special equipment/implants available, medications/allergies/relevant history reviewed, required imaging and test results available.   Sterile Technique Maximal sterile technique including full sterile barrier drape, hand hygiene, sterile gown, sterile gloves, mask, hair covering, sterile ultrasound probe cover (if used).   Procedure Description Area of catheter insertion was cleaned with chlorhexidine and draped in sterile fashion. With real-time ultrasound guidance an arterial catheter was placed into the right femoral artery.  Appropriate arterial tracings confirmed on monitor.     Complications/Tolerance None; patient tolerated the procedure well.   EBL Minimal   Specimen(s) None   Darcella Gasman Aeisha Minarik, PA-C

## 2023-05-27 NOTE — Progress Notes (Signed)
RT x 2 attempted to place left radial arterial line without success. MD notified.

## 2023-05-27 NOTE — ED Provider Notes (Signed)
[place] EMERGENCY DEPARTMENT AT [facility] Provider Note   CSN: [id number] Arrival date & time: 05/27/23  1455     History  No chief complaint on file.   [name] is a 42 y.o. female.  HPI  Patient has a history of multiple medical problems including diabetes, end-stage renal disease on dialysis, anxiety, blindness, Guillain-Barr, stroke.  Patient recently hospitalized for small bowel obstruction requiring extended ileal cecotomy complicated with small bowel ischemia.  Last procedure was done on 05/04/2023.  Ultimately discharged in the hospital on 05/24/2023.  Patient presenting hypotensive from dialysis today.  Reportedly had about 2 hours of dialysis.  Was reportedly hypotensive prior to beginning dialysis as well.  On arrival with EMS, best blood pressure obtained was in the 70s over 40s.  Patient herself is confused, repeating questions.     Home Medications Prior to Admission medications   Medication Sig Start Date End Date Taking? Authorizing Provider  Accu-Chek FastClix Lancets MISC Use as instructed to check blood sugar up to TID. E11.22 Patient taking differently: 1 each by Other route See admin instructions. Use as instructed to check blood sugar up to TID. E11.22 04/15/19   Hoy Register, MD  acetaminophen (TYLENOL) 325 MG tablet Take 650 mg by mouth every 6 (six) hours as needed for fever or mild pain.    [provider]  atorvastatin (LIPITOR) 40 MG tablet Place 1 tablet (40 mg total) into feeding tube daily. Patient taking differently: Take 40 mg by mouth daily. 10/26/21   Burnadette Pop, MD  azelastine (OPTIVAR) 0.05 % ophthalmic solution Place 1 drop into both eyes 2 (two) times daily as needed (allergies). 11/13/22   [provider]  cetirizine (ZYRTEC) 10 MG tablet Take 10 mg by mouth daily.    [provider]  Continuous Blood Gluc Transmit (DEXCOM G6 TRANSMITTER) MISC 1 Device by Does not apply route as directed.  09/12/20   Shamleffer, Konrad Dolores, MD  Dorzolamide HCl-Timolol Mal PF 2-0.5 % SOLN Place 1 drop into the left eye in the morning and at bedtime.    [provider]  dorzolamide-timolol (COSOPT) 22.3-6.8 MG/ML ophthalmic solution Place 1 drop into the left eye 2 (two) times daily. 03/30/21   [provider]  dronabinol (MARINOL) 5 MG capsule Take 1 capsule (5 mg total) by mouth 2 (two) times daily before lunch and supper. 05/24/23   Rhetta Mura, MD  glucagon, human recombinant, (GLUCAGEN DIAGNOSTIC) 1 MG injection Inject 1 mg into the vein once as needed for low blood sugar.    [provider]  glucose blood (ACCU-CHEK GUIDE) test strip Use as instructed to check blood sugar up to TID. E11.22 Patient taking differently: 1 each by Other route See admin instructions. Use as instructed to check blood sugar up to TID. E11.22 07/17/19   Fulp, Cammie, MD  HYDROcodone-acetaminophen (NORCO/VICODIN) 5-325 MG tablet Take 1 tablet by mouth every 6 (six) hours as needed for moderate pain. 05/24/23   Rhetta Mura, MD  insulin aspart (FIASP FLEXTOUCH) 100 UNIT/ML FlexTouch Pen Inject 1-10 Units into the skin 4 (four) times daily -  before meals and at bedtime. Inject as per sliding scale: If 121-150= 1 units call MD if blood sugar <70 or 400>; 151-200= 2 units 201-250= 3 units 251-300= 5 units 301-350= 7 units 351-400= 10 units Subcutaneously before meals and at bedtime.    [provider]  insulin glargine (SEMGLEE) 100 UNIT/ML injection Inject 0.1 mLs (10 Units total) into the  skin daily at 6 PM. Patient taking differently: Inject 10 Units into the skin daily. 12/28/22   Lorin Glass, MD  lanthanum (FOSRENOL) 1000 MG chewable tablet Chew 1 tablet (1,000 mg total) by mouth 3 (three) times daily with meals. 05/24/23   Rhetta Mura, MD  loperamide (IMODIUM) 2 MG capsule Take 1 capsule (2 mg total) by mouth every 6 (six) hours as needed for diarrhea or  loose stools. 05/24/23   Rhetta Mura, MD  midodrine (PROAMATINE) 10 MG tablet Take 1 tablet (10 mg total) by mouth 3 (three) times daily with meals. 05/24/23   Rhetta Mura, MD  Multiple Vitamin (MULTIVITAMIN WITH MINERALS) TABS tablet Take 1 tablet by mouth daily.    [provider]  Nutritional Supplements (NUTRITIONAL DRINK) LIQD Take 120 mLs by mouth daily.    [provider]  ondansetron (ZOFRAN) 4 MG tablet Take 4 mg by mouth every 4 (four) hours as needed for nausea/vomiting. 03/02/22   [provider]  polycarbophil (FIBERCON) 625 MG tablet Take 2 tablets (1,250 mg total) by mouth 2 (two) times daily. 05/24/23   Rhetta Mura, MD  thiamine 100 MG tablet Place 1 tablet (100 mg total) into feeding tube daily. Patient taking differently: Take 100 mg by mouth daily. 10/26/21   Burnadette Pop, MD      Allergies    Morphine, Peanut-containing drug products, Penicillins, Shellfish allergy, and Chocolate    Review of Systems   Review of Systems  Physical Exam Updated Vital Signs BP 92/65   Pulse (!) 135   Resp 10   SpO2 96%  Physical Exam Constitutional:      General: She is in acute distress.     Appearance: She is ill-appearing.     Comments: Confused, repeating self  HENT:     Head: Normocephalic and atraumatic.     Right Ear: External ear normal.     Left Ear: External ear normal.     Nose: Nose normal.     Mouth/Throat:     Mouth: Mucous membranes are moist.  Eyes:     Comments: Left pupil 4 mm, right pupil is 1 mm  Cardiovascular:     Rate and Rhythm: Regular rhythm. Tachycardia present.  Pulmonary:     Breath sounds: No wheezing.     Comments: 4L Foard Abdominal:     Comments: Ostomy tube with surrounding purulence on right lower quadrant.  Abdomen itself has an open vertical wound with packing.  Does not appear grossly infected superficially  Musculoskeletal:     Comments: Bilateral BKA  Skin:    Findings: No rash.   Neurological:     Mental Status: She is alert. She is disoriented.      ED Results / Procedures / Treatments   Labs (all labs ordered are listed, but only abnormal results are displayed) Labs Reviewed  CBC WITH DIFFERENTIAL/PLATELET - Abnormal; Notable for the following components:      Result Value   WBC 12.5 (*)    RBC 2.85 (*)    Hemoglobin 7.5 (*)    HCT 25.3 (*)    MCHC 29.6 (*)    RDW 20.7 (*)    nRBC 0.4 (*)    Neutro Abs 10.6 (*)    Abs Immature Granulocytes 0.09 (*)    All other components within normal limits  COMPREHENSIVE METABOLIC PANEL - Abnormal; Notable for the following components:   Chloride 94 (*)    Glucose, Bld 111 (*)    Creatinine,  Ser 3.48 (*)    Albumin 2.4 (*)    AST 59 (*)    GFR, Estimated 16 (*)    Anion gap 19 (*)    All other components within normal limits  CBG MONITORING, ED - Abnormal; Notable for the following components:   Glucose-Capillary 117 (*)    All other components within normal limits  I-STAT VENOUS BLOOD GAS, ED - Abnormal; Notable for the following components:   pO2, Ven 25 (*)    Bicarbonate 29.4 (*)    Acid-Base Excess 4.0 (*)    HCT 29.0 (*)    Hemoglobin 9.9 (*)    All other components within normal limits  I-STAT CG4 LACTIC ACID, ED - Abnormal; Notable for the following components:   Lactic Acid, Venous 2.4 (*)    All other components within normal limits  I-STAT CHEM 8, ED - Abnormal; Notable for the following components:   Chloride 95 (*)    Creatinine, Ser 3.70 (*)    Glucose, Bld 113 (*)    Hemoglobin 9.9 (*)    HCT 29.0 (*)    All other components within normal limits  TROPONIN I (HIGH SENSITIVITY) - Abnormal; Notable for the following components:   Troponin I (High Sensitivity) 22 (*)    All other components within normal limits  CULTURE, BLOOD (ROUTINE X 2)  CULTURE, BLOOD (ROUTINE X 2)  MAGNESIUM  URINALYSIS, W/ REFLEX TO CULTURE (INFECTION SUSPECTED)  LACTIC ACID, PLASMA  LACTIC ACID, PLASMA  CBC   BASIC METABOLIC PANEL  MAGNESIUM  PHOSPHORUS  TROPONIN I (HIGH SENSITIVITY)    EKG EKG Interpretation Date/Time:  Monday May 27 2023 15:04:29 EDT Ventricular Rate:  91 PR Interval:  136 QRS Duration:  83 QT Interval:  357 QTC Calculation: 440 R Axis:   49  Text Interpretation: Sinus rhythm Non-specific ST-t changes Confirmed by Cathren Laine ([id number]) on 05/27/2023 3:50:33 PM  Radiology CT CHEST ABDOMEN PELVIS W CONTRAST  Result Date: 05/27/2023 CLINICAL DATA:  Sepsis. Altered mental status. Open abdominal wound. EXAM: CT CHEST, ABDOMEN, AND PELVIS WITH CONTRAST TECHNIQUE: Multidetector CT imaging of the chest, abdomen and pelvis was performed following the standard protocol during bolus administration of intravenous contrast. RADIATION DOSE REDUCTION: This exam was performed according to the departmental dose-optimization program which includes automated exposure control, adjustment of the mA and/or kV according to patient size and/or use of iterative reconstruction technique. CONTRAST:  60mL OMNIPAQUE IOHEXOL 350 MG/ML SOLN COMPARISON:  CT abdomen pelvis dated 05/25/2023. FINDINGS: CT CHEST FINDINGS Cardiovascular: There is no cardiomegaly or pericardial effusion. Advanced 3 vessel coronary vascular calcification. Left IJ central venous line with tip at the cavoatrial junction. There is mild atherosclerotic calcification of the thoracic aorta. No aneurysmal dilatation or dissection. The origins of the great vessels of the aortic arch and the central pulmonary arteries appear patent as visualized. Mediastinum/Nodes: No hilar or mediastinal adenopathy. The esophagus is grossly unremarkable. No mediastinal fluid collection. Lungs/Pleura: Clusters of nodular densities with tree-in-bud appearance bilaterally most consistent with an infectious process, possibly atypical in etiology or aspiration. Clinical correlation is recommended. No consolidative changes. There is no pleural effusion or  pneumothorax. The central airways are patent. Musculoskeletal: There is diffuse subcutaneous edema. No fluid collection. No acute osseous pathology. 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 CT ABDOMEN PELVIS FINDINGS No intra-abdominal free air or free fluid. Hepatobiliary: The liver is unremarkable. Mild biliary dilatation. High attenuating content within the gallbladder as seen previously and may  represent vicarious excretion of contrast versus gallstones. No pericholecystic fluid. Pancreas: The pancreas is atrophic for age. There is mild dilatation of the main pancreatic duct. No active inflammatory changes. Spleen: Normal in size without focal abnormality. Adrenals/Urinary Tract: The adrenal glands are unremarkable. Extensive renal vascular calcification. There is moderate bilateral renal parenchyma atrophy. There is no hydronephrosis on either side. The urinary bladder is collapsed. There is diffuse thickened appearance of the bladder wall with speckled calcification, likely sequela chronic infection/inflammation. Correlation with urinalysis recommended to evaluate for active cystitis. Stomach/Bowel: Diffusely thickened colon may be related to underdistention or represent pancolitis. Clinical correlation recommended. 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 No bowel obstruction. Vascular/Lymphatic: Advanced aortoiliac atherosclerotic disease. There is extensive calcification of the mesenteric arteries. The IVC is unremarkable. No portal venous gas. There is no adenopathy. Reproductive: The uterus is anteverted. Diffuse calcification of uterine vasculature. No adnexal masses. Other: Midline vertical anterior pelvic wall open surgical wound. No fluid collection. There is diffuse subcutaneous edema. Musculoskeletal: No acute osseous pathology. IMPRESSION: 1. Clusters of nodular densities with tree-in-bud appearance bilaterally most  consistent with an infectious process, possibly atypical in etiology or aspiration. 2. Diffusely thickened colon may be related to underdistention or represent pancolitis. 3. 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 No bowel obstruction. 4. Diffuse thickened appearance of the bladder wall. Correlation with urinalysis recommended to evaluate for active cystitis. 5.  Aortic Atherosclerosis (ICD10-I70.0). Electronically Signed   By: Elgie Collard M.D.   On: 05/27/2023 19:23   DG Chest Portable 1 View  Result Date: 05/27/2023 CLINICAL DATA:  Shortness of breath. EXAM: PORTABLE CHEST 1 VIEW COMPARISON:  Most recent radiograph 05/18/2023 FINDINGS: Left-sided dialysis catheter tip in the right atrium. Heart size upper normal. Stable mediastinal contours. Coronary stent visualized. No focal airspace disease, pleural effusion, pulmonary edema or pneumothorax. IMPRESSION: No acute findings or explanation for shortness of breath. Electronically Signed   By: Narda Rutherford M.D.   On: 05/27/2023 17:26   CT Head Wo Contrast  Result Date: 05/27/2023 CLINICAL DATA:  Headache, neuro deficit AMS unequal pupils EXAM: CT HEAD WITHOUT CONTRAST TECHNIQUE: Contiguous axial images were obtained from the base of the skull through the vertex without intravenous contrast. RADIATION DOSE REDUCTION: This exam was performed according to the departmental dose-optimization program which includes automated exposure control, adjustment of the mA and/or kV according to patient size and/or use of iterative reconstruction technique. COMPARISON:  CT head November 23, 2021. FINDINGS: Brain: Motion limited study. Multifocal cortical hyperdensity within the right greater than left parieto-occipital regions. No significant mass effect. No mass lesion. Patchy white matter hypodensities likely represent chronic microvascular ischemic disease. 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 Vascular: No hyperdense vessel.  Skull: No acute fracture. Sinuses/Orbits: Mostly clear sinuses.  No acute orbital findings. Other: No mastoid effusions. IMPRESSION: Multifocal likely cortical hyperdensity involving the right greater than left parieto-occipital regions, suspicious for cortical laminar necrosis due to subacute infarct and/or small volume of acute/recent hemorrhage. No significant mass effect. Recommend MRI brain to further evaluate. Findings discussed with Dr. Ledon Snare via telephone at 5:03 p.m. Electronically Signed   By: Feliberto Harts M.D.   On: 05/27/2023 17:04    Procedures Procedures    Medications Ordered in ED Medications  norepinephrine (LEVOPHED) 4mg  in (0.016 mg/mL) premix infusion (40 mcg/min Intravenous New Bag/Given 05/27/23 1840)  vasopressin (PITRESSIN) 20 Units in 100 mL (0.2 unit/mL) infusion-*FOR SHOCK* (0.03 Units/min Intravenous  Infusion Verify 05/27/23 1551)  docusate sodium (COLACE) capsule 100 mg (has no administration in time range)  polyethylene glycol (MIRALAX / GLYCOLAX) packet 17 g (has no administration in time range)  heparin injection 5,000 Units (has no administration in time range)  LORazepam (ATIVAN) injection 1 mg (has no administration in time range)  fentaNYL (SUBLIMAZE) injection 12.5-25 mcg (has no administration in time range)  vancomycin (VANCOCIN) IVPB 1000 mg/200 mL premix (0 mg Intravenous Stopped 05/27/23 1933)  ceFEPIme (MAXIPIME) 1 g in sodium chloride 0.9 % 100 mL IVPB (0 g Intravenous Stopped 05/27/23 1558)  metroNIDAZOLE (FLAGYL) IVPB 500 mg (0 mg Intravenous Stopped 05/27/23 1933)  vasopressin 20 units/100 mL infusion SOLN (0 Units/min  Stopped 05/27/23 1550)  sodium chloride 0.9 % bolus 1,000 mL (1,000 mLs Intravenous New Bag/Given 05/27/23 1535)  sodium chloride 0.9 % bolus 1,000 mL (0 mLs Intravenous Stopped 05/27/23 1549)  iohexol (OMNIPAQUE) 350 MG/ML injection 60 mL (60 mLs Intravenous Contrast Given 05/27/23 1642)  fentaNYL (SUBLIMAZE)  injection 25 mcg (25 mcg Intravenous Given 05/27/23 1831)    ED Course/ Medical Decision Making/ A&P                                 Medical Decision Making Amount and/or Complexity of Data Reviewed Labs: ordered. Radiology: ordered.  Risk Prescription drug management. Decision regarding hospitalization.   42 year old female with past medical history and HPI as above.  On arrival, patient is altered and unable to provide any history.  Opening blood pressure was 70/31.  IV access obtained and her central line was further heparinized and accessed.  She was started on IV fluids, but initial blood pressure remained refractory to her first liter of fluid, therefore decision was made to implement pressor support.  Norepinephrine was started.  Record review shows an extensive surgical history secondary to SBO with ischemic bowel.  She was covered broadly with antibiotics including vancomycin, cefepime, Flagyl.  She has a history of Proteus on culture data, but not ESBL.  Zosyn not chosen due to allergy.  Second liter of IV fluids were started, but patient blood pressure remained refractory.  Vasopressin was added to her norepinephrine.  Initial i-STAT venous gas studies demonstrate a lactic acid of 2.4.  His blood gas shows a pH of 7.4 with a CO2 of 46.  I-STAT did not show any significant electrolyte abnormalities such as hyperkalemia.  EKG shows a sinus rhythm with ST segment depressions with inversions diffusely, but most pronounced in the inferior and lateral leads.  Not a STEMI.  Initial troponin 22.  Once patient was appropriately stabilized from a blood pressure standpoint, she was sent to the CT scanner for evaluation of her head, chest, abdomen, pelvis.  I reviewed her head CT which seem to show a bleed on the left side.  I contacted radiology who read her scan report that this is suspicious for cortical laminar necrosis due to subacute infarct and/or small volume of acute or recent  hemorrhage.  No mass effect visualized.  CT scan of her chest, abdomen, pelvis showed clusters of nodular densities with tree-in-bud appearance within the lungs themselves, possibly suggestive of infection.  There is also thickening of the colon which may represent pancolitis.  Additional postsurgical changes are seen without bowel obstruction.  Given patient's pressor need, altered mental status, and brain hemorrhage versus necrosis, she will require ICU level of care.  Handoff was given to the  ICU provider who can follow-up on possible MRI imaging of her brain.  Please refer to their notes for remainder of treatment.    Final Clinical Impression(s) / ED Diagnoses Final diagnoses:  Hypotension, unspecified hypotension type    Rx / DC Orders ED Discharge Orders     None         Lyman Speller, MD 05/27/23 2051    Cathren Laine, MD 05/28/23 1404

## 2023-05-27 NOTE — Progress Notes (Addendum)
eLink Physician-Brief Progress Note Patient Name: [name] DOB: 05-02-1981 MRN: [id number]   Date of Service  05/27/2023  HPI/Events of Note  42/F with insulin-dependent DM, ESRD, s/p bilateral AKA, brought in from dialysis due to hypotension.  She had recent hospitalization and had ex lap with extended ileo-cecectomy, end ileostomy on 05/04/23, discharged to SNF only 3 days prior.  She has dialysis catheter on her chest.  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   BP 113/64, HR 134, RR 16, O2 sats 95%.    eICU Interventions  Continue antibiotics.  Follow up cultures.  Insulin for glucose control.  Dialysis as per Renal. MRI brain is pending.  Heparin for DVT prophylaxis.       Intervention Category Evaluation Type: New Patient Evaluation  Larinda Buttery 05/27/2023, 9:51 PM  10:08 PM Notified of glucose at 405.   Plan> Start on moderate dose sliding scale insulin q4hrs.   11:37 PM Glucose 444 but has not been given any insulin.   Plan> Pls give insulin as per ordered.  Wound care consult placed for abdominal wound dressing.  12:37 AM HR in the 130s, sinus tachycardia. PT is afebrile, currently on norepinephrine and vasopressin.   Plan> Start on neosynephrine with goal of titrating norepinephrine to off.  Continue vasopressin.   4:59 AM Notified of lactate at 3.5 <-- 2.4.  Pt remains on neosyneprhine, vasopressin and levophed.  Levophed is being titrated down.  HR is better in the 110s.   K 3.1, crea 3.78.   Plan> Continue titrating levophed down to off.  Replete K.

## 2023-05-27 NOTE — ED Triage Notes (Signed)
Pt BIB [facility] from Dialysis for hypotension and AMS.  Pt was hypotensive in the 70's SBP prior to dialysis.  The dialysis center gave 200 mL NS and proceeded to dialyze the pt.  PT received 2 hrs of dialysis.  Pt is BL BKA.  Pt has a colonoscopy and an open abdomen awaiting a revision.

## 2023-05-27 NOTE — ED Notes (Signed)
ED TO INPATIENT HANDOFF REPORT  ED Nurse Name and Phone #: Pennie Rushing A. Joselle Deeds, RN  S Name/Age/Gender [name] 42 y.o. female Room/Bed: TRAAC/TRAAC  Code Status   Code Status: Full Code  Home/SNF/Other Home Patient oriented to: self, place, time, and situation Is this baseline? Yes      Chief Complaint Septic shock (HCC) [A41.9, R65.21]  Triage Note Pt BIB [facility] from Dialysis for hypotension and AMS.  Pt was hypotensive in the 70's SBP prior to dialysis.  The dialysis center gave 200 mL NS and proceeded to dialyze the pt.  PT received 2 hrs of dialysis.  Pt is BL BKA.  Pt has a colonoscopy and an open abdomen awaiting a revision.    Allergies Allergies  Allergen Reactions   Morphine Rash and Other (See Comments)    Per patient MAR.   Peanut-Containing Drug Products Anaphylaxis and Hives   Penicillins Rash and Other (See Comments)    Rash in 2008.  Tolerated cefazolin in 2020. No reaction listed on MAR.   Shellfish Allergy Swelling   Chocolate Hives    Level of Care/Admitting Diagnosis ED Disposition     ED Disposition  Admit   Condition  --   Comment  Hospital Area: [facility] [[id number]]  Level of Care: ICU [6]  May admit patient to [facility] or [facility] if equivalent level of care is available:: No  Covid Evaluation: Asymptomatic - no recent exposure (last 10 days) testing not required  Diagnosis: Septic shock [facility]) [[id number]]  Admitting Physician: Charlott Holler [[id number]]  Attending Physician: Charlott Holler [[id number]]  Certification:: I certify this patient will need inpatient services for at least 2 midnights  Expected Medical Readiness: 05/31/2023          B Medical/Surgery History Past Medical History:  Diagnosis Date   Anemia    Anxiety    Blind right eye 2008   Diabetes mellitus without complication (HCC)    Embolic stroke (HCC)    ESRD (end stage renal disease) (HCC)    HD on M/W/F   ESRD on hemodialysis (HCC)    GBS  (Guillain Barre syndrome) (HCC)    Past Surgical History:  Procedure Laterality Date   AMPUTATION Right 08/11/2021   Procedure: TRANSMETATARSAL AMPUTATION OF RIGHT FOOT;  Surgeon: Nada Libman, MD;  Location: [facility] OR;  Service: Vascular;  Laterality: Right;   AMPUTATION Right 09/17/2021   Procedure: AMPUTATION RIGHT BELOW KNEE;  Surgeon: Nada Libman, MD;  Location: [facility] OR;  Service: Vascular;  Laterality: Right;   AMPUTATION Right 10/11/2021   Procedure: RIGHT ABOVE KNEE AMPUTATION;  Surgeon: Nada Libman, MD;  Location: [facility] OR;  Service: Vascular;  Laterality: Right;   AMPUTATION Left 07/02/2022   Procedure: LEFT ABOVE KNEE AMPUTATION;  Surgeon: Chuck Hint, MD;  Location: [facility] OR;  Service: Vascular;  Laterality: Left;   AMPUTATION TOE Left    APPLICATION OF WOUND VAC  08/16/2021   Procedure: APPLICATION OF WOUND VAC;  Surgeon: Nada Libman, MD;  Location: [facility] OR;  Service: Vascular;;   AV FISTULA PLACEMENT     BUBBLE STUDY  10/02/2021   Procedure: BUBBLE STUDY;  Surgeon: Pricilla Riffle, MD;  Location: [facility] ENDOSCOPY;  Service: Cardiovascular;;   CESAREAN SECTION  2011   COLOSTOMY Right 05/04/2023   Procedure: COLOSTOMY;  Surgeon: Moise Boring, MD;  Location: [facility] OR;  Service: General;  Laterality: Right;   FISTULA SUPERFICIALIZATION Right 07/27/2019   Procedure:  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;  Surgeon: Chuck Hint, MD;  Location: [facility] OR;  Service: Vascular;  Laterality: Right;   INSERTION OF DIALYSIS CATHETER Left 07/27/2019   Procedure: INSERTION OF TUNNELED  DIALYSIS CATHETER;  Surgeon: Chuck Hint, MD;  Location: [facility] OR;  Service: Vascular;  Laterality: Left;   INSERTION OF DIALYSIS CATHETER Left 10/23/2021   Procedure: INSERTION OF TUNNELED PALINDROME PRECISION DIALYSIS CATHETER (23cm);  Surgeon: Cephus Shelling, MD;  Location: [facility] OR;  Service: Vascular;  Laterality: Left;   IR AV DIALY SHUNT INTRO NEEDLE/INTRACATH  INITIAL W/PTA/IMG RIGHT Right 05/14/2023   IR FLUORO GUIDE CV LINE LEFT  10/16/2021   IR FLUORO GUIDE CV LINE LEFT  05/10/2023   IR FLUORO GUIDE CV LINE RIGHT  10/11/2021   IR GASTROSTOMY TUBE MOD SED  10/30/2021   IR GASTROSTOMY TUBE REMOVAL  05/08/2022   IR US GUIDE VASC ACCESS LEFT  10/16/2021   IR US GUIDE VASC ACCESS LEFT  05/10/2023   IR US GUIDE VASC ACCESS RIGHT  10/11/2021   IR US GUIDE VASC ACCESS RIGHT  05/14/2023   LAPAROSCOPY N/A 05/04/2023   Procedure: LAPAROSCOPY DIAGNOSTIC;  Surgeon: Moise Boring, MD;  Location: [facility] OR;  Service: General;  Laterality: N/A;   LAPAROTOMY N/A 05/04/2023   Procedure: EXPLORATION LAPAROTOMY WITH ILEO-CAECAL RESECTION;  Surgeon: Moise Boring, MD;  Location: [facility] OR;  Service: General;  Laterality: N/A;   LEFT HEART CATH AND CORONARY ANGIOGRAPHY N/A 07/26/2022   Procedure: LEFT HEART CATH AND CORONARY ANGIOGRAPHY;  Surgeon: Swaziland, Peter M, MD;  [facility]: [facility] INVASIVE CV LAB;  Service: Cardiovascular;  Laterality: N/A;   LOWER EXTREMITY ANGIOGRAPHY N/A 08/11/2021   Procedure: LOWER EXTREMITY ANGIOGRAPHY;  Surgeon: Nada Libman, MD;  Location: [facility] INVASIVE CV LAB;  Service: Cardiovascular;  Laterality: N/A;   REVISON OF ARTERIOVENOUS FISTULA Right 10/23/2021   Procedure: REVISON OF ARTERIOVENOUS FISTULA  ARM AND PLICATION;  Surgeon: Cephus Shelling, MD;  Location: [facility] OR;  Service: Vascular;  Laterality: Right;   TEE WITHOUT CARDIOVERSION N/A 10/02/2021   Procedure: TRANSESOPHAGEAL ECHOCARDIOGRAM (TEE);  Surgeon: Pricilla Riffle, MD;  Location: [facility] ENDOSCOPY;  Service: Cardiovascular;  Laterality: N/A;   TRANSMETATARSAL AMPUTATION Right 08/16/2021   Procedure: CLOSURE OF RIGHT TRANSMETATARSAL AMPUTATION;  Surgeon: Nada Libman, MD;  Location: [facility] OR;  Service: Vascular;  Laterality: Right;     A IV Location/Drains/Wounds Patient Lines/Drains/Airways Status     Active Line/Drains/Airways     Name Placement date Placement time Site Days   Peripheral  IV 05/27/23 20 G Anterior;Left;Upper Arm 05/27/23  1510  Arm  less than 1   CVC Triple Lumen 05/27/23 Left Internal jugular 05/27/23  1515  -- less than 1            Intake/Output Last 24 hours  Intake/Output Summary (Last 24 hours) at 05/27/2023 1934 Last data filed at 05/27/2023 1552 Gross per 24 hour  Intake 1179.62 ml  Output --  Net 1179.62 ml    Labs/Imaging Results for orders placed or performed during the hospital encounter of 05/27/23 (from the past 48 hour(s))  CBG monitoring, ED     Status: Abnormal   Collection Time: 05/27/23  3:06 PM  Result Value Ref Range   Glucose-Capillary 117 (H) 70 - 99 mg/dL    Comment: Glucose reference range applies only to samples taken after fasting for at least 8 hours.   Comment 1 Notify RN    Comment  2 Document in Chart   CBC with Differential     Status: Abnormal   Collection Time: 05/27/23  3:10 PM  Result Value Ref Range   WBC 12.5 (H) 4.0 - 10.5 K/uL   RBC 2.85 (L) 3.87 - 5.11 MIL/uL   Hemoglobin 7.5 (L) 12.0 - 15.0 g/dL   HCT 86.5 (L) 78.4 - 69.6 %   MCV 88.8 80.0 - 100.0 fL   MCH 26.3 26.0 - 34.0 pg   MCHC 29.6 (L) 30.0 - 36.0 g/dL   RDW 29.5 (H) 28.4 - 13.2 %   Platelets 316 150 - 400 K/uL   nRBC 0.4 (H) 0.0 - 0.2 %   Neutrophils Relative % 85 %   Neutro Abs 10.6 (H) 1.7 - 7.7 K/uL   Lymphocytes Relative 9 %   Lymphs Abs 1.2 0.7 - 4.0 K/uL   Monocytes Relative 3 %   Monocytes Absolute 0.4 0.1 - 1.0 K/uL   Eosinophils Relative 2 %   Eosinophils Absolute 0.3 0.0 - 0.5 K/uL   Basophils Relative 0 %   Basophils Absolute 0.1 0.0 - 0.1 K/uL   Immature Granulocytes 1 %   Abs Immature Granulocytes 0.09 (H) 0.00 - 0.07 K/uL    Comment: Performed at [facility], [address] [place], [place] [address]  Comprehensive metabolic panel     Status: Abnormal   Collection Time: 05/27/23  3:10 PM  Result Value Ref Range   Sodium 137 135 - 145 mmol/L   Potassium 4.0 3.5 - 5.1 mmol/L   Chloride 94 (L) 98 - 111 mmol/L    CO2 24 22 - 32 mmol/L   Glucose, Bld 111 (H) 70 - 99 mg/dL    Comment: Glucose reference range applies only to samples taken after fasting for at least 8 hours.   BUN 19 6 - 20 mg/dL   Creatinine, Ser 2.72 (H) 0.44 - 1.00 mg/dL   Calcium 53.6 8.9 - 64.4 mg/dL   Total Protein 7.5 6.5 - 8.1 g/dL   Albumin 2.4 (L) 3.5 - 5.0 g/dL   AST 59 (H) 15 - 41 U/L   ALT 25 0 - 44 U/L   Alkaline Phosphatase 124 38 - 126 U/L   Total Bilirubin 1.0 0.3 - 1.2 mg/dL   GFR, Estimated 16 (L) >60 mL/min    Comment: (NOTE) Calculated using the CKD-EPI Creatinine Equation (2021)    Anion gap 19 (H) 5 - 15    Comment: Performed at [facility], [address] [place], [place] [address]  Magnesium     Status: None   Collection Time: 05/27/23  3:10 PM  Result Value Ref Range   Magnesium 1.7 1.7 - 2.4 mg/dL    Comment: Performed at [facility], [address] [place], [place] [address]  Troponin I (High Sensitivity)     Status: Abnormal   Collection Time: 05/27/23  3:10 PM  Result Value Ref Range   Troponin I (High Sensitivity) 22 (H) <18 ng/L    Comment: (NOTE) Elevated high sensitivity troponin I (hsTnI) values and significant  changes across serial measurements may suggest ACS but many other  chronic and acute conditions are known to elevate hsTnI results.  Refer to the "Links" section for chest pain algorithms and additional  guidance. Performed at [facility], [address], [place], [place] [address]   I-Stat venous blood gas, ED     Status: Abnormal   Collection Time: 05/27/23  3:11 PM  Result Value Ref Range   pH, Ven 7.409 7.25 - 7.43   pCO2, Ven 46.4 44 - 60 mmHg   pO2, Ven 25 (LL) 32 - 45 mmHg   Bicarbonate 29.4 (H) 20.0 - 28.0 mmol/L   TCO2 31 22 - 32 mmol/L   O2 Saturation 45 %   Acid-Base Excess 4.0 (H) 0.0 - 2.0 mmol/L   Sodium 136 135 - 145 mmol/L   Potassium 3.9 3.5 - 5.1 mmol/L   Calcium, Ion 1.23 1.15 - 1.40 mmol/L   HCT 29.0 (L) 36.0 - 46.0 %    Hemoglobin 9.9 (L) 12.0 - 15.0 g/dL   Sample type VENOUS    Comment NOTIFIED PHYSICIAN   I-Stat CG4 Lactic Acid, ED     Status: Abnormal   Collection Time: 05/27/23  3:13 PM  Result Value Ref Range   Lactic Acid, Venous 2.4 (HH) 0.5 - 1.9 mmol/L   Comment NOTIFIED PHYSICIAN   I-stat chem 8, ed     Status: Abnormal   Collection Time: 05/27/23  3:14 PM  Result Value Ref Range   Sodium 137 135 - 145 mmol/L   Potassium 3.9 3.5 - 5.1 mmol/L   Chloride 95 (L) 98 - 111 mmol/L   BUN 20 6 - 20 mg/dL   Creatinine, Ser 1.61 (H) 0.44 - 1.00 mg/dL   Glucose, Bld 096 (H) 70 - 99 mg/dL    Comment: Glucose reference range applies only to samples taken after fasting for at least 8 hours.   Calcium, Ion 1.24 1.15 - 1.40 mmol/L   TCO2 27 22 - 32 mmol/L   Hemoglobin 9.9 (L) 12.0 - 15.0 g/dL   HCT 04.5 (L) 40.9 - 81.1 %   CT CHEST ABDOMEN PELVIS W CONTRAST  Result Date: 05/27/2023 CLINICAL DATA:  Sepsis. Altered mental status. Open abdominal wound. EXAM: CT CHEST, ABDOMEN, AND PELVIS WITH CONTRAST TECHNIQUE: Multidetector CT imaging of the chest, abdomen and pelvis was performed following the standard protocol during bolus administration of intravenous contrast. RADIATION DOSE REDUCTION: This exam was performed according to the departmental dose-optimization program which includes automated exposure control, adjustment of the mA and/or kV according to patient size and/or use of iterative reconstruction technique. CONTRAST:  60mL OMNIPAQUE IOHEXOL 350 MG/ML SOLN COMPARISON:  CT abdomen pelvis dated 05/25/2023. FINDINGS: CT CHEST FINDINGS Cardiovascular: There is no cardiomegaly or pericardial effusion. Advanced 3 vessel coronary vascular calcification. Left IJ central venous line with tip at the cavoatrial junction. There is mild atherosclerotic calcification of the thoracic aorta. No aneurysmal dilatation or dissection. The origins of the great vessels of the aortic arch and the central pulmonary arteries  appear patent as visualized. Mediastinum/Nodes: No hilar or mediastinal adenopathy. The esophagus is grossly unremarkable. No mediastinal fluid collection. Lungs/Pleura: Clusters of nodular densities with tree-in-bud appearance bilaterally most consistent with an infectious process, possibly atypical in etiology or aspiration. Clinical correlation is recommended. No consolidative changes. There is no pleural effusion or pneumothorax. The central airways are patent. Musculoskeletal: There is diffuse subcutaneous edema. No fluid collection. No acute osseous pathology. 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 CT ABDOMEN PELVIS FINDINGS No intra-abdominal free air or free fluid. Hepatobiliary: The liver is unremarkable. Mild biliary dilatation. High attenuating content within the gallbladder as seen previously and may represent vicarious excretion of contrast versus gallstones. No pericholecystic fluid. Pancreas: The pancreas is atrophic for age. There is mild dilatation of the main pancreatic duct. No active inflammatory changes. Spleen:  Normal in size without focal abnormality. Adrenals/Urinary Tract: The adrenal glands are unremarkable. Extensive renal vascular calcification. There is moderate bilateral renal parenchyma atrophy. There is no hydronephrosis on either side. The urinary bladder is collapsed. There is diffuse thickened appearance of the bladder wall with speckled calcification, likely sequela chronic infection/inflammation. Correlation with urinalysis recommended to evaluate for active cystitis. Stomach/Bowel: Diffusely thickened colon may be related to underdistention or represent pancolitis. Clinical correlation recommended. 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 No bowel obstruction. Vascular/Lymphatic: Advanced aortoiliac atherosclerotic disease. There is extensive calcification of the mesenteric arteries. The IVC is  unremarkable. No portal venous gas. There is no adenopathy. Reproductive: The uterus is anteverted. Diffuse calcification of uterine vasculature. No adnexal masses. Other: Midline vertical anterior pelvic wall open surgical wound. No fluid collection. There is diffuse subcutaneous edema. Musculoskeletal: No acute osseous pathology. IMPRESSION: 1. Clusters of nodular densities with tree-in-bud appearance bilaterally most consistent with an infectious process, possibly atypical in etiology or aspiration. 2. Diffusely thickened colon may be related to underdistention or represent pancolitis. 3. 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 No bowel obstruction. 4. Diffuse thickened appearance of the bladder wall. Correlation with urinalysis recommended to evaluate for active cystitis. 5.  Aortic Atherosclerosis (ICD10-I70.0). Electronically Signed   By: Elgie Collard M.D.   On: 05/27/2023 19:23   DG Chest Portable 1 View  Result Date: 05/27/2023 CLINICAL DATA:  Shortness of breath. EXAM: PORTABLE CHEST 1 VIEW COMPARISON:  Most recent radiograph 05/18/2023 FINDINGS: Left-sided dialysis catheter tip in the right atrium. Heart size upper normal. Stable mediastinal contours. Coronary stent visualized. No focal airspace disease, pleural effusion, pulmonary edema or pneumothorax. IMPRESSION: No acute findings or explanation for shortness of breath. Electronically Signed   By: Narda Rutherford M.D.   On: 05/27/2023 17:26   CT Head Wo Contrast  Result Date: 05/27/2023 CLINICAL DATA:  Headache, neuro deficit AMS unequal pupils EXAM: CT HEAD WITHOUT CONTRAST TECHNIQUE: Contiguous axial images were obtained from the base of the skull through the vertex without intravenous contrast. RADIATION DOSE REDUCTION: This exam was performed according to the departmental dose-optimization program which includes automated exposure control, adjustment of the mA and/or kV according to patient size and/or  use of iterative reconstruction technique. COMPARISON:  CT head November 23, 2021. FINDINGS: Brain: Motion limited study. Multifocal cortical hyperdensity within the right greater than left parieto-occipital regions. No significant mass effect. No mass lesion. Patchy white matter hypodensities likely represent chronic microvascular ischemic disease. 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 Vascular: No hyperdense vessel. Skull: No acute fracture. Sinuses/Orbits: Mostly clear sinuses.  No acute orbital findings. Other: No mastoid effusions. IMPRESSION: Multifocal likely cortical hyperdensity involving the right greater than left parieto-occipital regions, suspicious for cortical laminar necrosis due to subacute infarct and/or small volume of acute/recent hemorrhage. No significant mass effect. Recommend MRI brain to further evaluate. Findings discussed with Dr. Ledon Snare via telephone at 5:03 p.m. Electronically Signed   By: Feliberto Harts M.D.   On: 05/27/2023 17:04    Pending Labs Unresulted Labs (From admission, onward)     Start     Ordered   05/28/23 0500  CBC  Tomorrow morning,   R        05/27/23 1824   05/28/23 0500  Basic metabolic panel  Tomorrow morning,   R        05/27/23 1824   05/28/23 0500  Magnesium  Tomorrow morning,   R  05/27/23 1824   05/28/23 0500  Phosphorus  Tomorrow morning,   R        05/27/23 1824   05/27/23 1557  Blood culture (routine x 2)  BLOOD CULTURE X 2,   R (with STAT occurrences)      05/27/23 1556   05/27/23 1516  Lactic acid, plasma  (Lactic Acid)  Now then every 2 hours,   R (with STAT occurrences)      05/27/23 1515   05/27/23 1515  Urinalysis, w/ Reflex to Culture (Infection Suspected) -Urine, Clean Catch  Once,   URGENT       Question:  Specimen Source  Answer:  Urine, Clean Catch   05/27/23 1515            Vitals/Pain Today's Vitals   05/27/23 1900 05/27/23 1915 05/27/23 1930 05/27/23 1933  BP:      Pulse:    (!) 135  Resp: 14 15 16     SpO2:    96%    Isolation Precautions No active isolations  Medications Medications  norepinephrine (LEVOPHED) 4mg  in (0.016 mg/mL) premix infusion (40 mcg/min Intravenous New Bag/Given 05/27/23 1840)  vasopressin (PITRESSIN) 20 Units in 100 mL (0.2 unit/mL) infusion-*FOR SHOCK* (0.03 Units/min Intravenous Infusion Verify 05/27/23 1551)  docusate sodium (COLACE) capsule 100 mg (has no administration in time range)  polyethylene glycol (MIRALAX / GLYCOLAX) packet 17 g (has no administration in time range)  heparin injection 5,000 Units (has no administration in time range)  LORazepam (ATIVAN) injection 1 mg (has no administration in time range)  vancomycin (VANCOCIN) IVPB 1000 mg/200 mL premix (0 mg Intravenous Stopped 05/27/23 1933)  ceFEPIme (MAXIPIME) 1 g in sodium chloride 0.9 % 100 mL IVPB (0 g Intravenous Stopped 05/27/23 1558)  metroNIDAZOLE (FLAGYL) IVPB 500 mg (0 mg Intravenous Stopped 05/27/23 1933)  vasopressin 20 units/100 mL infusion SOLN (0 Units/min  Stopped 05/27/23 1550)  sodium chloride 0.9 % bolus 1,000 mL (1,000 mLs Intravenous New Bag/Given 05/27/23 1535)  sodium chloride 0.9 % bolus 1,000 mL (0 mLs Intravenous Stopped 05/27/23 1549)  iohexol (OMNIPAQUE) 350 MG/ML injection 60 mL (60 mLs Intravenous Contrast Given 05/27/23 1642)  fentaNYL (SUBLIMAZE) injection 25 mcg (25 mcg Intravenous Given 05/27/23 1831)    Mobility non-ambulatory     Focused Assessments    R Recommendations: See Admitting Provider Note  Report given to:   Additional Notes: Call or epic message for any additional details

## 2023-05-28 ENCOUNTER — Inpatient Hospital Stay (HOSPITAL_COMMUNITY): Payer: Medicare Other

## 2023-05-28 DIAGNOSIS — R6521 Severe sepsis with septic shock: Secondary | ICD-10-CM | POA: Diagnosis not present

## 2023-05-28 DIAGNOSIS — A419 Sepsis, unspecified organism: Secondary | ICD-10-CM | POA: Diagnosis not present

## 2023-05-28 LAB — GLUCOSE, CAPILLARY
Glucose-Capillary: 333 mg/dL — ABNORMAL HIGH (ref 70–99)
Glucose-Capillary: 93 mg/dL (ref 70–99)
Glucose-Capillary: 134 mg/dL — ABNORMAL HIGH (ref 70–99)
Glucose-Capillary: 129 mg/dL — ABNORMAL HIGH (ref 70–99)
Glucose-Capillary: 52 mg/dL — ABNORMAL LOW (ref 70–99)
Glucose-Capillary: 46 mg/dL — ABNORMAL LOW (ref 70–99)
Glucose-Capillary: 61 mg/dL — ABNORMAL LOW (ref 70–99)
Glucose-Capillary: 117 mg/dL — ABNORMAL HIGH (ref 70–99)
Glucose-Capillary: 102 mg/dL — ABNORMAL HIGH (ref 70–99)
Glucose-Capillary: 88 mg/dL (ref 70–99)

## 2023-05-28 LAB — CBC
HCT: 23.7 % — ABNORMAL LOW (ref 36.0–46.0)
Hemoglobin: 7.1 g/dL — ABNORMAL LOW (ref 12.0–15.0)
MCH: 26.5 pg (ref 26.0–34.0)
MCHC: 30 g/dL (ref 30.0–36.0)
MCV: 88.4 fL (ref 80.0–100.0)
Platelets: 416 10*3/uL — ABNORMAL HIGH (ref 150–400)
RBC: 2.68 MIL/uL — ABNORMAL LOW (ref 3.87–5.11)
RDW: 20.4 % — ABNORMAL HIGH (ref 11.5–15.5)
WBC: 27.9 10*3/uL — ABNORMAL HIGH (ref 4.0–10.5)
nRBC: 1 % — ABNORMAL HIGH (ref 0.0–0.2)

## 2023-05-28 LAB — BASIC METABOLIC PANEL
Anion gap: 16 — ABNORMAL HIGH (ref 5–15)
BUN: 21 mg/dL — ABNORMAL HIGH (ref 6–20)
CO2: 19 mmol/L — ABNORMAL LOW (ref 22–32)
Calcium: 10.1 mg/dL (ref 8.9–10.3)
Chloride: 94 mmol/L — ABNORMAL LOW (ref 98–111)
Creatinine, Ser: 3.78 mg/dL — ABNORMAL HIGH (ref 0.44–1.00)
GFR, Estimated: 15 mL/min — ABNORMAL LOW (ref >60)
Glucose, Bld: 308 mg/dL — ABNORMAL HIGH (ref 70–99)
Potassium: 3.1 mmol/L — ABNORMAL LOW (ref 3.5–5.1)
Sodium: 129 mmol/L — ABNORMAL LOW (ref 135–145)

## 2023-05-28 LAB — MAGNESIUM: Magnesium: 1.5 mg/dL — ABNORMAL LOW (ref 1.7–2.4)

## 2023-05-28 LAB — MRSA NEXT GEN BY PCR, NASAL: MRSA by PCR Next Gen: NOT DETECTED

## 2023-05-28 LAB — LACTIC ACID, PLASMA
Lactic Acid, Venous: 3.5 mmol/L (ref 0.5–1.9)
Lactic Acid, Venous: 2.2 mmol/L (ref 0.5–1.9)

## 2023-05-28 LAB — PHOSPHORUS: Phosphorus: 4 mg/dL (ref 2.5–4.6)

## 2023-05-28 MED ORDER — POTASSIUM CHLORIDE 20 MEQ PO PACK: 20.0000 meq | PACK | Freq: Once | ORAL | Status: DC

## 2023-05-28 MED ORDER — HYDROCORTISONE SOD SUC (PF) 100 MG IJ SOLR
100.0000 mg | Freq: Two times a day (BID) | INTRAMUSCULAR | Status: DC
  Administered 2023-05-28 – 2023-05-29 (×4): 100 mg via INTRAVENOUS
  Filled 2023-05-28 (×4): qty 2

## 2023-05-28 MED ORDER — MAGNESIUM SULFATE 2 GM/50ML IV SOLN
2.0000 g | Freq: Once | INTRAVENOUS | Status: AC
  Administered 2023-05-28: 2 g via INTRAVENOUS
  Filled 2023-05-28: qty 50

## 2023-05-28 MED ORDER — SODIUM CHLORIDE 0.9 % IV SOLN: INTRAVENOUS | Status: DC | PRN

## 2023-05-28 MED ORDER — PANTOPRAZOLE SODIUM 40 MG IV SOLR
40.0000 mg | INTRAVENOUS | Status: DC
  Administered 2023-05-28: 40 mg via INTRAVENOUS
  Filled 2023-05-28: qty 10

## 2023-05-28 MED ORDER — FENTANYL CITRATE PF 50 MCG/ML IJ SOSY
25.0000 ug | PREFILLED_SYRINGE | INTRAMUSCULAR | Status: DC | PRN
  Administered 2023-05-28 (×2): 50 ug via INTRAVENOUS
  Administered 2023-05-28: 25 ug via INTRAVENOUS
  Administered 2023-05-28 – 2023-05-29 (×3): 50 ug via INTRAVENOUS
  Filled 2023-05-28 (×7): qty 1

## 2023-05-28 MED ORDER — ALTEPLASE 2 MG IJ SOLR
2.0000 mg | Freq: Once | INTRAMUSCULAR | Status: AC
  Administered 2023-05-28: 2 mg
  Filled 2023-05-28: qty 2

## 2023-05-28 MED ORDER — PHENYLEPHRINE HCL-NACL 20-0.9 MG/250ML-% IV SOLN
0.0000 ug/min | INTRAVENOUS | Status: DC
  Administered 2023-05-28: 20 ug/min via INTRAVENOUS
  Administered 2023-05-28: 70 ug/min via INTRAVENOUS
  Filled 2023-05-28: qty 250

## 2023-05-28 MED ORDER — POTASSIUM CHLORIDE 10 MEQ/100ML IV SOLN
10.0000 meq | INTRAVENOUS | Status: AC
  Administered 2023-05-28 (×2): 10 meq via INTRAVENOUS
  Filled 2023-05-28 (×2): qty 100

## 2023-05-28 MED ORDER — SODIUM CHLORIDE 0.9 % IV SOLN: 2.0000 g | INTRAVENOUS | Status: DC

## 2023-05-28 MED ORDER — METRONIDAZOLE 500 MG/100ML IV SOLN
500.0000 mg | Freq: Two times a day (BID) | INTRAVENOUS | Status: DC
  Administered 2023-05-28 – 2023-05-29 (×3): 500 mg via INTRAVENOUS
  Filled 2023-05-28 (×3): qty 100

## 2023-05-28 MED ORDER — CARMEX CLASSIC LIP BALM EX OINT: TOPICAL_OINTMENT | CUTANEOUS | Status: DC | PRN

## 2023-05-28 MED ORDER — SODIUM CHLORIDE 0.9 % IV SOLN
1.0000 g | INTRAVENOUS | Status: AC
  Administered 2023-05-28 – 2023-06-02 (×6): 1 g via INTRAVENOUS
  Filled 2023-05-28 (×6): qty 10

## 2023-05-28 MED ORDER — IOHEXOL 350 MG/ML SOLN
75.0000 mL | Freq: Once | INTRAVENOUS | Status: AC | PRN
  Administered 2023-05-28: 75 mL via INTRAVENOUS

## 2023-05-28 MED ORDER — STROKE: EARLY STAGES OF RECOVERY BOOK
Freq: Once | Status: AC
  Filled 2023-05-28: qty 1

## 2023-05-28 MED ORDER — DAKINS (1/4 STRENGTH) 0.125 % EX SOLN
Freq: Two times a day (BID) | CUTANEOUS | Status: AC
  Administered 2023-05-30: 1
  Filled 2023-05-28 (×3): qty 473

## 2023-05-28 MED ORDER — DEXTROSE 50 % IV SOLN: 25.0000 g | INTRAVENOUS | Status: AC

## 2023-05-28 MED ORDER — INSULIN GLARGINE-YFGN 100 UNIT/ML ~~LOC~~ SOLN
10.0000 [IU] | Freq: Every day | SUBCUTANEOUS | Status: DC
  Administered 2023-05-28: 10 [IU] via SUBCUTANEOUS
  Filled 2023-05-28 (×2): qty 0.1

## 2023-05-28 MED ORDER — HEPARIN SODIUM (PORCINE) 1000 UNIT/ML IJ SOLN
1.4000 mL | Freq: Once | INTRAMUSCULAR | Status: DC
  Administered 2023-05-28: 1400 [IU] via INTRAVENOUS

## 2023-05-28 MED ORDER — POTASSIUM CHLORIDE 10 MEQ/50ML IV SOLN
10.0000 meq | INTRAVENOUS | Status: AC
  Administered 2023-05-28 (×2): 10 meq via INTRAVENOUS
  Filled 2023-05-28 (×2): qty 50

## 2023-05-28 MED ORDER — VANCOMYCIN HCL 500 MG/100ML IV SOLN
500.0000 mg | INTRAVENOUS | Status: DC
  Administered 2023-05-30: 500 mg via INTRAVENOUS
  Filled 2023-05-28 (×2): qty 100

## 2023-05-28 MED ORDER — DEXTROSE 50 % IV SOLN
INTRAVENOUS | Status: AC
  Administered 2023-05-28: 50 mL via INTRAVENOUS
  Filled 2023-05-28: qty 50

## 2023-05-28 MED ORDER — MIDODRINE HCL 5 MG PO TABS
10.0000 mg | ORAL_TABLET | Freq: Three times a day (TID) | ORAL | Status: DC
  Administered 2023-05-28 – 2023-06-16 (×51): 10 mg via ORAL
  Filled 2023-05-28 (×54): qty 2

## 2023-05-28 NOTE — H&P (Signed)
NAME:  [name], MRN:  [id number], DOB:  [birth year], LOS: 1 ADMISSION DATE:  05/25/2023, CONSULTATION DATE:  05/28/2023 REFERRING MD: EDP, CHIEF COMPLAINT:  septic shock  History of Present Illness:  [name] is a 42 year old woman with insulin-dependent diabetes mellitus, resultant end-stage renal disease, bilateral AKA, blindness and severe peripheral arterial disease.  She presents hypotensive from dialysis today.  She had a recent prolonged hospital stay in September 2024 for small bowel ischemia with resultant small bowel obstruction.  She had an extended ileal cecotomy placed.  She was discharged 3 days ago to SNF.  Of note her AV fistula cannot be accessed due to pseudoaneurysm and repeated clotting.  She thus has a dialysis catheter placed in the right chest.  There is report that she was not adherent to dressing changes for her open abdominal wound, and did not agree to wound VAC.  She was found to be hypotensive in the ED and started on 2 vasopressors.  PCCM consulted for admission.  History is limited and obtained primarily from chart review and discussion with EDP.  Pertinent  Medical History  IDDM ESRD Bilateral AKA Blindness Severe PAD and recd  Significant Hospital Events: Including procedures, antibiotic start and stop dates in addition to other pertinent events   05/17/2023 admitted to the hospital with hypotension after dialysis concern for septic shock  Interim History / Subjective:  Weaning vasopressors.  Complaint of pain.  Fentanyl dose increased.  Objective   Blood pressure (!) 58/44, pulse (!) 108, temperature 97.6 F (36.4 C), temperature source Oral, resp. rate 18, weight 52.9 kg, SpO2 100%.        Intake/Output Summary (Last 24 hours) at 05/28/2023 1253 Last data filed at 05/28/2023 0900 Gross per 24 hour  Intake 2931.82 ml  Output 0 ml  Net 2931.82 ml   Filed Weights   05/28/23 0356  Weight: 52.9 kg    Examination: General: chronically  ill appearing, older than stated age HENT: dry mucus membranes Lungs: breathing non labored, lungs clear Cardiovascular: tachycardic, regular Abdomen: open abdominal wound ventral incision, cecotomy pouch in place, Extremities: bilateral AKA Neuro: awake, blind, not oriented to situation GU: cecotomy. In diaper  CT head shows concern for right greater than left subacute infarct or hemorrhage versus chronic necrotic changes of strokes.  MRI brain recommended.  Ct chest abdomen pelvis read without abdominal catastrophe  Mild lactic acidosis Resolved Hospital Problem list     Assessment & Plan:  Septic Shock secondary to suspected wound infection -Continue Vanco cefepime Flagyl given Penicillin allergy.   -SBP goal of 120, currently on norepinephrine, vasopressin, phenylephrine, wide pulse pressure, may need to decrease SBP goal given likely low baseline blood pressure -Baseline BP look 80s-90s -Stress dose steroids started 10/15 - Appreciate surgery assistance  End-stage renal disease, likely diabetic nephropathy -Has temporary HD catheter (trialysis) in place given AV fistula is currently not functional -Appreciate nephrology assistance  Type 2 diabetes Severe peripheral or vascular disease Recent small bowel ischemia Bilateral AKA -Start low-dose Semglee Semglee, sliding scale insulin  Acute encephalopathy Hemorrhagic conversion versus necrosis of prior infarcts She is alert and conversant this morning.  Seems improved.  Likely related to sepsis. -- Appreciate neurology assistance -- MRI once able, IV Ativan to attempt in better pictures, she is quite agitated may be difficult to get clear pictures  Best Practice (right click and "Reselect all SmartList Selections" daily)   Diet/type: NPO DVT prophylaxis: prophylactic heparin  GI prophylaxis: N/A Lines: Dialysis Catheter  Foley:  N/A Code Status:  full code Last date of multidisciplinary goals of care discussion  [pending]  Labs   CBC: Recent Labs  Lab 05/22/23 0450 05/24/23 0549 05/25/23 1159 05/25/2023 1510 05/18/2023 1511 06/09/2023 1514 05/28/23 0403  WBC 14.9* 10.5 12.6* 12.5*  --   --  27.9*  NEUTROABS  --  7.2 10.3* 10.6*  --   --   --   HGB 7.6* 9.4* 8.5* 7.5* 9.9* 9.9* 7.1*  HCT 25.5* 31.1* 30.2* 25.3* 29.0* 29.0* 23.7*  MCV 87.3 89.1 91.5 88.8  --   --  88.4  PLT 359 334 349 316  --   --  416*    Basic Metabolic Panel: Recent Labs  Lab 05/22/23 0450 05/24/23 0549 05/25/23 1159 06/09/2023 1510 05/19/2023 1511 05/15/2023 1514 05/28/23 0403  NA 133* 133* 134* 137 136 137 129*  K 4.3 4.2 4.5 4.0 3.9 3.9 3.1*  CL 93* 94* 91* 94*  --  95* 94*  CO2 24 25 20* 24  --   --  19*  GLUCOSE 120* 175* 282* 111*  --  113* 308*  BUN 28* 20 18 19   --  20 21*  CREATININE 5.18* 4.22* 3.98* 3.48*  --  3.70* 3.78*  CALCIUM 9.9 10.8* 11.7* 10.0  --   --  10.1  MG  --   --   --  1.7  --   --  1.5*  PHOS  --  5.1*  --   --   --   --  4.0   GFR: Estimated Creatinine Clearance: 14.8 mL/min (A) (by C-G formula based on SCr of 3.78 mg/dL (H)). Recent Labs  Lab 05/24/23 0549 05/25/23 1159 05/25/23 1204 05/25/23 1510 06/12/2023 1510 06/04/2023 1513 05/28/23 0403 05/28/23 0920  WBC 10.5 12.6*  --   --  12.5*  --  27.9*  --   LATICACIDVEN  --   --    < > 1.2  --  2.4* 3.5* 2.2*   < > = values in this interval not displayed.    Liver Function Tests: Recent Labs  Lab 05/24/23 0549 05/25/23 1159 05/30/2023 1510  AST  --  14* 59*  ALT  --  13 25  ALKPHOS  --  101 124  BILITOT  --  1.0 1.0  PROT  --  8.4* 7.5  ALBUMIN 2.4* 2.8* 2.4*   Recent Labs  Lab 05/25/23 1159  LIPASE 55*   No results for input(s): "AMMONIA" in the last 168 hours.  ABG    Component Value Date/Time   PHART 7.440 09/22/2021 1134   PCO2ART 34.8 09/22/2021 1134   PO2ART 63.6 (L) 09/22/2021 1134   HCO3 29.4 (H) 05/17/2023 1511   TCO2 27 06/03/2023 1514   ACIDBASEDEF 0.4 09/22/2021 1134   O2SAT 45 06/02/2023 1511      Coagulation Profile: No results for input(s): "INR", "PROTIME" in the last 168 hours.  Cardiac Enzymes: No results for input(s): "CKTOTAL", "CKMB", "CKMBINDEX", "TROPONINI" in the last 168 hours.  HbA1C: Hgb A1c MFr Bld  Date/Time Value Ref Range Status  04/30/2023 09:30 AM 5.7 (H) 4.8 - 5.6 % Final    Comment:    (NOTE) Pre diabetes:          5.7%-6.4%  Diabetes:              >6.4%  Glycemic control for   <7.0% adults with diabetes   12/20/2022 01:40 PM 8.2 (H) 4.8 - 5.6 % Final  Comment:    (NOTE) Pre diabetes:          5.7%-6.4%  Diabetes:              >6.4%  Glycemic control for   <7.0% adults with diabetes     CBG: Recent Labs  Lab 05/26/2023 2201 05/20/2023 2321 05/28/23 0311 05/28/23 0757 05/28/23 1137  GLUCAP 405* 444* 333* 93 134*    Review of Systems:   +abdominal pain  Past Medical History:  She,  has a past medical history of Anemia, Anxiety, Blind right eye (2008), Diabetes mellitus without complication (HCC), Embolic stroke (HCC), ESRD (end stage renal disease) (HCC), ESRD on hemodialysis (HCC), and GBS (Guillain Barre syndrome) (HCC).   Surgical History:   Past Surgical History:  Procedure Laterality Date   AMPUTATION Right 08/11/2021   Procedure: TRANSMETATARSAL AMPUTATION OF RIGHT FOOT;  Surgeon: Nada Libman, MD;  Location: [facility] OR;  Service: Vascular;  Laterality: Right;   AMPUTATION Right 09/17/2021   Procedure: AMPUTATION RIGHT BELOW KNEE;  Surgeon: Nada Libman, MD;  Location: [facility] OR;  Service: Vascular;  Laterality: Right;   AMPUTATION Right 10/11/2021   Procedure: RIGHT ABOVE KNEE AMPUTATION;  Surgeon: Nada Libman, MD;  Location: [facility] OR;  Service: Vascular;  Laterality: Right;   AMPUTATION Left 07/02/2022   Procedure: LEFT ABOVE KNEE AMPUTATION;  Surgeon: Chuck Hint, MD;  Location: [facility] OR;  Service: Vascular;  Laterality: Left;   AMPUTATION TOE Left    APPLICATION OF WOUND VAC  08/16/2021   Procedure: APPLICATION OF  WOUND VAC;  Surgeon: Nada Libman, MD;  Location: [facility] OR;  Service: Vascular;;   AV FISTULA PLACEMENT     BUBBLE STUDY  10/02/2021   Procedure: BUBBLE STUDY;  Surgeon: Pricilla Riffle, MD;  Location: [facility] ENDOSCOPY;  Service: Cardiovascular;;   CESAREAN SECTION  2011   COLOSTOMY Right 05/04/2023   Procedure: COLOSTOMY;  Surgeon: Moise Boring, MD;  Location: [facility] OR;  Service: General;  Laterality: Right;   FISTULA SUPERFICIALIZATION Right 07/27/2019   Procedure: 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;  Surgeon: Chuck Hint, MD;  Location: [facility] OR;  Service: Vascular;  Laterality: Right;   INSERTION OF DIALYSIS CATHETER Left 07/27/2019   Procedure: INSERTION OF TUNNELED  DIALYSIS CATHETER;  Surgeon: Chuck Hint, MD;  Location: [facility] OR;  Service: Vascular;  Laterality: Left;   INSERTION OF DIALYSIS CATHETER Left 10/23/2021   Procedure: INSERTION OF TUNNELED PALINDROME PRECISION DIALYSIS CATHETER (23cm);  Surgeon: Cephus Shelling, MD;  Location: [facility] OR;  Service: Vascular;  Laterality: Left;   IR AV DIALY SHUNT INTRO NEEDLE/INTRACATH INITIAL W/PTA/IMG RIGHT Right 05/14/2023   IR FLUORO GUIDE CV LINE LEFT  10/16/2021   IR FLUORO GUIDE CV LINE LEFT  05/10/2023   IR FLUORO GUIDE CV LINE RIGHT  10/11/2021   IR GASTROSTOMY TUBE MOD SED  10/30/2021   IR GASTROSTOMY TUBE REMOVAL  05/08/2022   IR US GUIDE VASC ACCESS LEFT  10/16/2021   IR US GUIDE VASC ACCESS LEFT  05/10/2023   IR US GUIDE VASC ACCESS RIGHT  10/11/2021   IR US GUIDE VASC ACCESS RIGHT  05/14/2023   LAPAROSCOPY N/A 05/04/2023   Procedure: LAPAROSCOPY DIAGNOSTIC;  Surgeon: Moise Boring, MD;  Location: [facility] OR;  Service: General;  Laterality: N/A;   LAPAROTOMY N/A 05/04/2023   Procedure: EXPLORATION LAPAROTOMY WITH ILEO-CAECAL RESECTION;  Surgeon: Moise Boring, MD;  Location: [facility] OR;  Service: General;  Laterality:  N/A;   LEFT HEART CATH AND CORONARY ANGIOGRAPHY N/A 07/26/2022   Procedure: LEFT HEART CATH  AND CORONARY ANGIOGRAPHY;  Surgeon: Swaziland, Peter M, MD;  Location: [facility] INVASIVE CV LAB;  Service: Cardiovascular;  Laterality: N/A;   LOWER EXTREMITY ANGIOGRAPHY N/A 08/11/2021   Procedure: LOWER EXTREMITY ANGIOGRAPHY;  Surgeon: Nada Libman, MD;  Location: [facility] INVASIVE CV LAB;  Service: Cardiovascular;  Laterality: N/A;   REVISON OF ARTERIOVENOUS FISTULA Right 10/23/2021   Procedure: REVISON OF ARTERIOVENOUS FISTULA  ARM AND PLICATION;  Surgeon: Cephus Shelling, MD;  Location: [facility] OR;  Service: Vascular;  Laterality: Right;   TEE WITHOUT CARDIOVERSION N/A 10/02/2021   Procedure: TRANSESOPHAGEAL ECHOCARDIOGRAM (TEE);  Surgeon: Pricilla Riffle, MD;  Location: [facility] ENDOSCOPY;  Service: Cardiovascular;  Laterality: N/A;   TRANSMETATARSAL AMPUTATION Right 08/16/2021   Procedure: CLOSURE OF RIGHT TRANSMETATARSAL AMPUTATION;  Surgeon: Nada Libman, MD;  Location: [facility] OR;  Service: Vascular;  Laterality: Right;     Social History:   reports that she has never smoked. She has never used smokeless tobacco. She reports that she does not drink alcohol and does not use drugs.   Family History:  Her family history includes Diabetes in her father and mother.   Allergies Allergies  Allergen Reactions   Morphine Rash and Other (See Comments)    Per patient MAR.   Peanut-Containing Drug Products Anaphylaxis and Hives   Penicillins Rash and Other (See Comments)    Rash in 2008.  Tolerated cefazolin in 2020. No reaction listed on MAR.   Shellfish Allergy Swelling   Chocolate Hives     Home Medications  Prior to Admission medications   Medication Sig Start Date End Date Taking? Authorizing Provider  Accu-Chek FastClix Lancets MISC Use as instructed to check blood sugar up to TID. E11.22 Patient taking differently: 1 each by Other route See admin instructions. Use as instructed to check blood sugar up to TID. E11.22 04/15/19   Hoy Register, MD  acetaminophen (TYLENOL) 325 MG tablet Take 650 mg by  mouth every 6 (six) hours as needed for fever or mild pain.    [provider]  atorvastatin (LIPITOR) 40 MG tablet Place 1 tablet (40 mg total) into feeding tube daily. Patient taking differently: Take 40 mg by mouth daily. 10/26/21   Burnadette Pop, MD  azelastine (OPTIVAR) 0.05 % ophthalmic solution Place 1 drop into both eyes 2 (two) times daily as needed (allergies). 11/13/22   [provider]  cetirizine (ZYRTEC) 10 MG tablet Take 10 mg by mouth daily.    [provider]  Continuous Blood Gluc Transmit (DEXCOM G6 TRANSMITTER) MISC 1 Device by Does not apply route as directed. 09/12/20   Shamleffer, Konrad Dolores, MD  Dorzolamide HCl-Timolol Mal PF 2-0.5 % SOLN Place 1 drop into the left eye in the morning and at bedtime.    [provider]  dorzolamide-timolol (COSOPT) 22.3-6.8 MG/ML ophthalmic solution Place 1 drop into the left eye 2 (two) times daily. 03/30/21   [provider]  dronabinol (MARINOL) 5 MG capsule Take 1 capsule (5 mg total) by mouth 2 (two) times daily before lunch and supper. 05/24/23   Rhetta Mura, MD  glucagon, human recombinant, (GLUCAGEN DIAGNOSTIC) 1 MG injection Inject 1 mg into the vein once as needed for low blood sugar.    [provider]  glucose blood (ACCU-CHEK GUIDE) test strip Use as instructed to check blood sugar up to TID. E11.22 Patient taking differently: 1 each by  Other route See admin instructions. Use as instructed to check blood sugar up to TID. E11.22 07/17/19   Fulp, Cammie, MD  HYDROcodone-acetaminophen (NORCO/VICODIN) 5-325 MG tablet Take 1 tablet by mouth every 6 (six) hours as needed for moderate pain. 05/24/23   Rhetta Mura, MD  insulin aspart (FIASP FLEXTOUCH) 100 UNIT/ML FlexTouch Pen Inject 1-10 Units into the skin 4 (four) times daily -  before meals and at bedtime. Inject as per sliding scale: If 121-150= 1 units call MD if blood sugar <70 or 400>; 151-200= 2  units 201-250= 3 units 251-300= 5 units 301-350= 7 units 351-400= 10 units Subcutaneously before meals and at bedtime.    [provider]  insulin glargine (SEMGLEE) 100 UNIT/ML injection Inject 0.1 mLs (10 Units total) into the skin daily at 6 PM. Patient taking differently: Inject 10 Units into the skin daily. 12/28/22   Lorin Glass, MD  lanthanum (FOSRENOL) 1000 MG chewable tablet Chew 1 tablet (1,000 mg total) by mouth 3 (three) times daily with meals. 05/24/23   Rhetta Mura, MD  loperamide (IMODIUM) 2 MG capsule Take 1 capsule (2 mg total) by mouth every 6 (six) hours as needed for diarrhea or loose stools. 05/24/23   Rhetta Mura, MD  midodrine (PROAMATINE) 10 MG tablet Take 1 tablet (10 mg total) by mouth 3 (three) times daily with meals. 05/24/23   Rhetta Mura, MD  Multiple Vitamin (MULTIVITAMIN WITH MINERALS) TABS tablet Take 1 tablet by mouth daily.    [provider]  Nutritional Supplements (NUTRITIONAL DRINK) LIQD Take 120 mLs by mouth daily.    [provider]  ondansetron (ZOFRAN) 4 MG tablet Take 4 mg by mouth every 4 (four) hours as needed for nausea/vomiting. 03/02/22   [provider]  polycarbophil (FIBERCON) 625 MG tablet Take 2 tablets (1,250 mg total) by mouth 2 (two) times daily. 05/24/23   Rhetta Mura, MD  thiamine 100 MG tablet Place 1 tablet (100 mg total) into feeding tube daily. Patient taking differently: Take 100 mg by mouth daily. 10/26/21   Burnadette Pop, MD     Critical care time:     CRITICAL CARE Performed by: Karren Burly   Total critical care time: 36 minutes  Critical care time was exclusive of separately billable procedures and treating other patients.  Critical care was necessary to treat or prevent imminent or life-threatening deterioration.  Critical care was time spent personally by me on the following activities: development of treatment plan with patient and/or  surrogate as well as nursing, discussions with consultants, evaluation of patient's response to treatment, examination of patient, obtaining history from patient or surrogate, ordering and performing treatments and interventions, ordering and review of laboratory studies, ordering and review of radiographic studies, pulse oximetry and re-evaluation of patient's condition.       Lesia Sago Jazier Mcglamery [facility] 05/28/2023 12:53 PM  Pager: see AMION  If no response to pager , please call critical care on call (see AMION) until 7pm After 7:00 pm call Elink

## 2023-05-28 NOTE — Plan of Care (Signed)
MR brain pending at this time.  Neurology will follow once results available.  Milon Dikes, MD Neurology

## 2023-05-28 NOTE — Plan of Care (Signed)
MRI resulted IMPRESSION: 1. Punctate acute infarct in the right frontal lobe. 2. Cortical hyperintense signal on diffusion-weighted imaging in the right parieto-occipital region is favored to represent regions of acute to subacute infarct. The hyperdense appearance seen on same day head CT likely represents infarct related enhancement. Further evaluation with contrast-enhanced brain MRI is recommended. 3. 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 4. 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   Will order stroke work up  Stroke team to follow in the AM  Plan d/w Dr Judeth Horn  -- Milon Dikes, MD Neurologist [facility] Pager: [phone]

## 2023-05-28 NOTE — Consult Note (Addendum)
WOC Nurse Consult Note: patient well known to WOC team from previous admission (discharged 05/24/2023)  Reason for Consult: abdominal wound  Wound type: Full thickness abdominal wound post surgery 05/04/2023 exp lap w/ileocecectomy  Pressure Injury POA: NA  Measurement: 15 cm x 6 cm x 3 cm  Wound bed:75% necrotic with yellow slough and black eschar covering much of wound 25% pink and moist mostly at superior aspect of wound  Drainage (amount, consistency, odor) old dressing with bright green drainage (patient had this on previous admission), some tan exudate  Periwound: black eschar left lateral edge of wound, necrotic tissue surrounding ileostomy RLQ  Dressing procedure/placement/frequency: Clean abdominal wound with NS, apply Dakin's moistened gauze to wound bed twice daily, cover with dry gauze and ABD pad and tape.    Secure chat to primary MD that surgical consult is indicated to assess this wound.    WOC Nurse ostomy consult note; Ileostomy placed on last admission  Stoma type/location: RLQ ileostomy  Stomal assessment/size: 1" slightly above skin level, pink and moist  Peristomal assessment: area between stoma and abdominal wound necrotic with yellow slough, brown necrotic material covering large portion distal to stoma.  Treatment options for stomal/peristomal skin: no sting barrier wipe and crusting performed to denuded skin Output approximately 50 mls brown effluent in current pouch  Ostomy pouching: Removed old pouch that was leaking due to necrotic broken down skin adjacent to stoma, cleaned area and crusted as above.  Placed Eakin pouch to area in hopes of keeping a seal and protecting skin/wound from stool.  Change small Eakin pouch [place] [phone])  weekly or more frequently if leakage noted.  If pouch leaks, use pattern in the room to cut new pouch opening.  Sprinkle ostomy powder over the irritated skin, tap over powder with damp wash cloth or skin barrier wipe (Cavilon  No-Sting), brushing away excess powder to form a layer of "crust". Ok to apply 3 layers as described above.  Place barrier ring around open area (like a stoma) Place new Eakin pouch Write date on pouch  Asked nursing secretary to order 3 small Eakin pouches for room. Pattern left in room for bedside nurses.   Education provided:  none, patient resides in SNF.  This is a difficult to manage stoma and wound.  Enrolled patient in [employer] DC program: Yes on prior admission   WOC team will follow for ostomy support.  Thank you,    Priscella Mann MSN, RN-BC, [employer] [phone]

## 2023-05-28 NOTE — Progress Notes (Signed)
Progress Note     Subjective: Patient asking for pain meds  Objective: Vital signs in last 24 hours: Temp:  [97.6 F (36.4 C)-99.1 F (37.3 C)] 98.7 F (37.1 C) (10/15 0759) Pulse Rate:  [108-137] 108 (10/15 0730) Resp:  [8-25] 18 (10/15 0730) BP: (57-141)/(26-87) 58/44 (10/14 2213) SpO2:  [94 %-100 %] 100 % (10/15 0730) Arterial Line BP: (91-128)/(51-96) 111/51 (10/15 0730) Weight:  [52.9 kg] 52.9 kg (10/15 0356) Last BM Date : 05/28/23  Intake/Output from previous day: 10/14 0701 - 10/15 0700 In: 2776 [I.V.:1562.7; IV Piggyback:1213.3] Out: 0  Intake/Output this shift: Total I/O In: 155.9 [I.V.:72.1; IV Piggyback:83.7] Out: -   PE: General: chronically ill appearing  Lungs: Normal effort on [place] Abd: Eakins present, dressing C/D/I (just changed) - evaluated picture from [facility] RN, significant necrosis of skin around viable stoma and necrotic tissue with fibrinous exudate present in midline wound as well   Lab Results:  Recent Labs    05/27/23 1510 05/27/23 1511 05/27/23 1514 05/28/23 0403  WBC 12.5*  --   --  27.9*  HGB 7.5*   < > 9.9* 7.1*  HCT 25.3*   < > 29.0* 23.7*  PLT 316  --   --  416*   < > = values in this interval not displayed.   BMET Recent Labs    05/27/23 1510 05/27/23 1511 05/27/23 1514 05/28/23 0403  NA 137   < > 137 129*  K 4.0   < > 3.9 3.1*  CL 94*  --  95* 94*  CO2 24  --   --  19*  GLUCOSE 111*  --  113* 308*  BUN 19  --  20 21*  CREATININE 3.48*  --  3.70* 3.78*  CALCIUM 10.0  --   --  10.1   < > = values in this interval not displayed.   PT/INR No results for input(s): "LABPROT", "INR" in the last 72 hours. CMP     Component Value Date/Time   NA 129 (L) 05/28/2023 0403   NA 133 (L) 05/18/2020 0927   K 3.1 (L) 05/28/2023 0403   CL 94 (L) 05/28/2023 0403   CO2 19 (L) 05/28/2023 0403   GLUCOSE 308 (H) 05/28/2023 0403   BUN 21 (H) 05/28/2023 0403   BUN 45 (H) 05/18/2020 0927   CREATININE 3.78 (H) 05/28/2023 0403    CALCIUM 10.1 05/28/2023 0403   CALCIUM 12.1 (H) 11/13/2021 2026   PROT 7.5 05/27/2023 1510   PROT 7.6 05/18/2020 0927   ALBUMIN 2.4 (L) 05/27/2023 1510   ALBUMIN 3.9 05/18/2020 0927   AST 59 (H) 05/27/2023 1510   ALT 25 05/27/2023 1510   ALKPHOS 124 05/27/2023 1510   BILITOT 1.0 05/27/2023 1510   BILITOT <0.2 05/18/2020 0927   GFRNONAA 15 (L) 05/28/2023 0403   GFRAA 6 (L) 05/18/2020 0927   Lipase     Component Value Date/Time   LIPASE 55 (H) 05/25/2023 1159       Studies/Results: CT CHEST ABDOMEN PELVIS W CONTRAST  Result Date: 05/27/2023 CLINICAL DATA:  Sepsis. Altered mental status. Open abdominal wound. EXAM: CT CHEST, ABDOMEN, AND PELVIS WITH CONTRAST TECHNIQUE: Multidetector CT imaging of the chest, abdomen and pelvis was performed following the standard protocol during bolus administration of intravenous contrast. RADIATION DOSE REDUCTION: This exam was performed according to the departmental dose-optimization program which includes automated exposure control, adjustment of the mA and/or kV according to patient size and/or use of iterative reconstruction technique. CONTRAST:  60mL OMNIPAQUE IOHEXOL 350 MG/ML SOLN COMPARISON:  CT abdomen pelvis dated 05/25/2023. FINDINGS: CT CHEST FINDINGS Cardiovascular: There is no cardiomegaly or pericardial effusion. Advanced 3 vessel coronary vascular calcification. Left IJ central venous line with tip at the cavoatrial junction. There is mild atherosclerotic calcification of the thoracic aorta. No aneurysmal dilatation or dissection. The origins of the great vessels of the aortic arch and the central pulmonary arteries appear patent as visualized. Mediastinum/Nodes: No hilar or mediastinal adenopathy. The esophagus is grossly unremarkable. No mediastinal fluid collection. Lungs/Pleura: Clusters of nodular densities with tree-in-bud appearance bilaterally most consistent with an infectious process, possibly atypical in etiology or aspiration.  Clinical correlation is recommended. No consolidative changes. There is no pleural effusion or pneumothorax. The central airways are patent. Musculoskeletal: There is diffuse subcutaneous edema. No fluid collection. No acute osseous pathology. 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 CT ABDOMEN PELVIS FINDINGS No intra-abdominal free air or free fluid. Hepatobiliary: The liver is unremarkable. Mild biliary dilatation. High attenuating content within the gallbladder as seen previously and may represent vicarious excretion of contrast versus gallstones. No pericholecystic fluid. Pancreas: The pancreas is atrophic for age. There is mild dilatation of the main pancreatic duct. No active inflammatory changes. Spleen: Normal in size without focal abnormality. Adrenals/Urinary Tract: The adrenal glands are unremarkable. Extensive renal vascular calcification. There is moderate bilateral renal parenchyma atrophy. There is no hydronephrosis on either side. The urinary bladder is collapsed. There is diffuse thickened appearance of the bladder wall with speckled calcification, likely sequela chronic infection/inflammation. Correlation with urinalysis recommended to evaluate for active cystitis. Stomach/Bowel: Diffusely thickened colon may be related to underdistention or represent pancolitis. Clinical correlation recommended. 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 No bowel obstruction. Vascular/Lymphatic: Advanced aortoiliac atherosclerotic disease. There is extensive calcification of the mesenteric arteries. The IVC is unremarkable. No portal venous gas. There is no adenopathy. Reproductive: The uterus is anteverted. Diffuse calcification of uterine vasculature. No adnexal masses. Other: Midline vertical anterior pelvic wall open surgical wound. No fluid collection. There is diffuse subcutaneous edema. Musculoskeletal: No acute osseous pathology.  IMPRESSION: 1. Clusters of nodular densities with tree-in-bud appearance bilaterally most consistent with an infectious process, possibly atypical in etiology or aspiration. 2. Diffusely thickened colon may be related to underdistention or represent pancolitis. 3. 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 No bowel obstruction. 4. Diffuse thickened appearance of the bladder wall. Correlation with urinalysis recommended to evaluate for active cystitis. 5.  Aortic Atherosclerosis (ICD10-I70.0). Electronically Signed   By: Elgie Collard M.D.   On: 05/27/2023 19:23   DG Chest Portable 1 View  Result Date: 05/27/2023 CLINICAL DATA:  Shortness of breath. EXAM: PORTABLE CHEST 1 VIEW COMPARISON:  Most recent radiograph 05/18/2023 FINDINGS: Left-sided dialysis catheter tip in the right atrium. Heart size upper normal. Stable mediastinal contours. Coronary stent visualized. No focal airspace disease, pleural effusion, pulmonary edema or pneumothorax. IMPRESSION: No acute findings or explanation for shortness of breath. Electronically Signed   By: Narda Rutherford M.D.   On: 05/27/2023 17:26   CT Head Wo Contrast  Result Date: 05/27/2023 CLINICAL DATA:  Headache, neuro deficit AMS unequal pupils EXAM: CT HEAD WITHOUT CONTRAST TECHNIQUE: Contiguous axial images were obtained from the base of the skull through the vertex without intravenous contrast. RADIATION DOSE REDUCTION: This exam was performed according to the departmental dose-optimization program which includes automated exposure control, adjustment of the mA and/or  kV according to patient size and/or use of iterative reconstruction technique. COMPARISON:  CT head November 23, 2021. FINDINGS: Brain: Motion limited study. Multifocal cortical hyperdensity within the right greater than left parieto-occipital regions. No significant mass effect. No mass lesion. Patchy white matter hypodensities likely represent chronic microvascular  ischemic disease. 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 Vascular: No hyperdense vessel. Skull: No acute fracture. Sinuses/Orbits: Mostly clear sinuses.  No acute orbital findings. Other: No mastoid effusions. IMPRESSION: Multifocal likely cortical hyperdensity involving the right greater than left parieto-occipital regions, suspicious for cortical laminar necrosis due to subacute infarct and/or small volume of acute/recent hemorrhage. No significant mass effect. Recommend MRI brain to further evaluate. Findings discussed with Dr. Ledon Snare via telephone at 5:03 p.m. Electronically Signed   By: Feliberto Harts M.D.   On: 05/27/2023 17:04    Anti-infectives: Anti-infectives (From admission, onward)    Start     Dose/Rate Route Frequency Ordered Stop   05/29/23 1200  vancomycin (VANCOREADY) IVPB 500 mg/100 mL        500 mg 100 mL/hr over 60 Minutes Intravenous Every M-W-F (Hemodialysis) 05/28/23 0924     05/28/23 1500  ceFEPIme (MAXIPIME) 2 g in sodium chloride 0.9 % 100 mL IVPB  Status:  Discontinued        2 g 200 mL/hr over 30 Minutes Intravenous Every 24 hours 05/28/23 0921 05/28/23 0922   05/28/23 1500  ceFEPIme (MAXIPIME) 1 g in sodium chloride 0.9 % 100 mL IVPB        1 g 200 mL/hr over 30 Minutes Intravenous Every 24 hours 05/28/23 0922     05/28/23 0600  metroNIDAZOLE (FLAGYL) IVPB 500 mg        500 mg 100 mL/hr over 60 Minutes Intravenous Every 12 hours 05/28/23 0104     05/27/23 1600  vancomycin (VANCOCIN) IVPB 1000 mg/200 mL premix        1,000 mg 200 mL/hr over 60 Minutes Intravenous  Once 05/27/23 1508 05/27/23 1933   05/27/23 1515  ceFEPIme (MAXIPIME) 1 g in sodium chloride 0.9 % 100 mL IVPB        1 g 200 mL/hr over 30 Minutes Intravenous  Once 05/27/23 1508 05/27/23 1558   05/27/23 1515  metroNIDAZOLE (FLAGYL) IVPB 500 mg        500 mg 100 mL/hr over 60 Minutes Intravenous  Once 05/27/23 1508 05/27/23 1933        Assessment/Plan Small bowel ischemia with SBO  POD#24  s/p diagnostic lap converted to ex-lap with extended ileo-cecectomy (mid-ileum to just past cecum), 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 Hillery Hunter Necrotic tissue at midline wound and around ileostomy - appreciate WOC assistance - Eakin's placed today  - concern for pseudomonas in midline - Dakin's x72 hrs  - document ileostomy output and will titrate antimotility agents accordingly    FEN:  Renal diet, Glucerna ID: Rocephin/Flagyl 9/21 > 9/28 VTE: SCDs, SQH q 8h  - per CCM -  Septic shock - no intraabdominal source of infection on imaging, ?aspiration on CT Possible acute neurologic infarct - neuro following  IDDM ESD on HD Bilateral AKA Blind Severe peripheral arterial disease  LOS: 1 day     Juliet Rude, PA-C [facility] 05/28/2023, 10:00 AM Please see Amion for pager number during day hours 7:00am-4:30pm

## 2023-05-28 NOTE — Progress Notes (Signed)
Pharmacy Antibiotic Note  [name] is a 42 y.o. female admitted on 05/27/2023 with sepsis.  Pharmacy has been consulted for cefepime and vancomycin dosing. Pt is ESRD on HD - plans to continue PTA schedule (MWF).   Plan: Vancomycin 500 mg IV qMWF to begin tomorrow after dialysis  -goal trough 15-66mcg/mL, levels PRN per protocol Cefepime 1g IV q24h F/u iHD plans, culture data, CBC    Weight: 52.9 kg (116 lb 10 oz)  Temp (24hrs), Avg:98.4 F (36.9 C), Min:97.6 F (36.4 C), Max:99.1 F (37.3 C)  Recent Labs  Lab 05/22/23 0450 05/24/23 0549 05/25/23 1159 05/25/23 1204 05/25/23 1510 05/27/23 1510 05/27/23 1513 05/27/23 1514 05/28/23 0403  WBC 14.9* 10.5 12.6*  --   --  12.5*  --   --  27.9*  CREATININE 5.18* 4.22* 3.98*  --   --  3.48*  --  3.70* 3.78*  LATICACIDVEN  --   --   --  2.8* 1.2  --  2.4*  --  3.5*    Estimated Creatinine Clearance: 14.8 mL/min (A) (by C-G formula based on SCr of 3.78 mg/dL (H)).    Allergies  Allergen Reactions   Morphine Rash and Other (See Comments)    Per patient MAR.   Peanut-Containing Drug Products Anaphylaxis and Hives   Penicillins Rash and Other (See Comments)    Rash in 2008.  Tolerated cefazolin in 2020. No reaction listed on MAR.   Shellfish Allergy Swelling   Chocolate Hives    Antimicrobials this admission: Cefepime 10/14> Vanc 10/14> Flagyl 10/14>    Dose adjustments this admission:   Microbiology results: 10/14 MRSA neg  10/12 BCX: ng x2 10/14 BCX:   Thank you for allowing pharmacy to be a part of this patient's care.  Calton Dach, PharmD, BCCCP Clinical Pharmacist 05/28/2023 8:32 AM

## 2023-05-28 NOTE — Inpatient Diabetes Management (Signed)
Inpatient Diabetes Program Recommendations  AACE/ADA: New Consensus Statement on Inpatient Glycemic Control (2015)  Target Ranges:  Prepandial:   less than 140 mg/dL      Peak postprandial:   less than 180 mg/dL (1-2 hours)      Critically ill patients:  140 - 180 mg/dL   Lab Results  Component Value Date   GLUCAP 93 05/28/2023   HGBA1C 5.7 (H) 04/30/2023    Review of Glycemic Control  Latest Reference Range & Units 05/27/23 15:06 05/27/23 21:37 05/27/23 22:01 05/27/23 23:21 05/28/23 03:11 05/28/23 07:57  Glucose-Capillary 70 - 99 mg/dL 657 (H) 846 (H) 962 (H) 444 (H) 333 (H) 93   Diabetes history: DM Outpatient Diabetes medications:  Dexcom sensor, Fiasp 1-10 units 4 times a day Semglee 10 units daily Current orders for Inpatient glycemic control:  Novolog 0-15 units q 4 hours Semglee 10 units daily (just started) Solucortef 100 mg bid  Inpatient Diabetes Program Recommendations:    Consider reducing to sensitive (0-9 units) q 4 hours due to ESRD.  Will follow.   Thanks,  Beryl Meager, RN, BC-ADM Inpatient Diabetes Coordinator Pager [phone]  (8a-5p)

## 2023-05-28 NOTE — Lactation Note (Signed)
[facility]  INPATIENT CONSULTATION  Reason for Consultation: ERD Requesting Provider: Dr. Judeth Horn  HPI: [name] is an 42 y.o. female with ESRD secondary to DM, blindness, BL AKA, PAD, recent bowel ischemia c/b SBO requiring resection with ileostomy currently admitted for shock and is seen for evaluation and management of ESRD and assoc conditions.   Pt had LOC 2 hrs in to outpt HD treatment yesterday; briefly rec'd CPR but regained consciousness and was sent to ED.  She was hypotensive and admitted to ICU for vasopressor support broad spectrum antibiotics.   CT head possible subacute infarcts; MRI brain pending - neuro to see following scan. CT CAP pending.   She is currently c/o diffuse pain and unable to provide any additional history - yelling to be left alone.   Afebrile, on NE, vaso, phenyl.  100% on 2l [place].   Na 129, K 3.1, Bicarb 19, BUN 21, WBC28, Hb 7.1 PLt 416, lactate 3.5.   PER CHART: Unable to cannulate AV fistula on 9/27 because of pulling clots.  IR placed temporary HD catheter and pt received dialysis on 9/27.  Duplex US of AVF showed low blood flow, vascular surgeon and IR were consulted.  Pt went for fistulogram 10/1 by IR. There was a R cephalic stenosis treated w/ venoplasty, and a central stenosis also rx'd w/ venoplasty. 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 This area was marked and we are not to stick within the circular marking. The rest of the AVF is good to stick; did well w/ HD on 10/7.   PMH: Past Medical History:  Diagnosis Date   Anemia    Anxiety    Blind right eye 2008   Diabetes mellitus without complication (HCC)    Embolic stroke (HCC)    ESRD (end stage renal disease) (HCC)    HD on M/W/F   ESRD on hemodialysis (HCC)    GBS (Guillain Barre syndrome) (HCC)    PSH: Past Surgical History:  Procedure Laterality Date   AMPUTATION Right 08/11/2021    Procedure: TRANSMETATARSAL AMPUTATION OF RIGHT FOOT;  Surgeon: Nada Libman, MD;  [facility]: [facility] OR;  Service: Vascular;  Laterality: Right;   AMPUTATION Right 09/17/2021   Procedure: AMPUTATION RIGHT BELOW KNEE;  Surgeon: Nada Libman, MD;  Location: [facility] OR;  Service: Vascular;  Laterality: Right;   AMPUTATION Right 10/11/2021   Procedure: RIGHT ABOVE KNEE AMPUTATION;  Surgeon: Nada Libman, MD;  Location: [facility] OR;  Service: Vascular;  Laterality: Right;   AMPUTATION Left 07/02/2022   Procedure: LEFT ABOVE KNEE AMPUTATION;  Surgeon: Chuck Hint, MD;  Location: [facility] OR;  Service: Vascular;  Laterality: Left;   AMPUTATION TOE Left    APPLICATION OF WOUND VAC  08/16/2021   Procedure: APPLICATION OF WOUND VAC;  Surgeon: Nada Libman, MD;  Location: [facility] OR;  Service: Vascular;;   AV FISTULA PLACEMENT     BUBBLE STUDY  10/02/2021   Procedure: BUBBLE STUDY;  Surgeon: Pricilla Riffle, MD;  Location: [facility] ENDOSCOPY;  Service: Cardiovascular;;   CESAREAN SECTION  2011   COLOSTOMY Right 05/04/2023   Procedure: COLOSTOMY;  Surgeon: Moise Boring, MD;  Location: [facility] OR;  Service: General;  Laterality: Right;   FISTULA SUPERFICIALIZATION Right 07/27/2019   Procedure: 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;  Surgeon: Chuck Hint, MD;  Location: [facility] OR;  Service: Vascular;  Laterality: Right;  INSERTION OF DIALYSIS CATHETER Left 07/27/2019   Procedure: INSERTION OF TUNNELED  DIALYSIS CATHETER;  Surgeon: Chuck Hint, MD;  Location: [facility] OR;  Service: Vascular;  Laterality: Left;   INSERTION OF DIALYSIS CATHETER Left 10/23/2021   Procedure: INSERTION OF TUNNELED PALINDROME PRECISION DIALYSIS CATHETER (23cm);  Surgeon: Cephus Shelling, MD;  Location: [facility] OR;  Service: Vascular;  Laterality: Left;   IR AV DIALY SHUNT INTRO NEEDLE/INTRACATH INITIAL W/PTA/IMG RIGHT Right 05/14/2023   IR FLUORO GUIDE CV LINE LEFT  10/16/2021   IR FLUORO GUIDE CV LINE LEFT   05/10/2023   IR FLUORO GUIDE CV LINE RIGHT  10/11/2021   IR GASTROSTOMY TUBE MOD SED  10/30/2021   IR GASTROSTOMY TUBE REMOVAL  05/08/2022   IR US GUIDE VASC ACCESS LEFT  10/16/2021   IR US GUIDE VASC ACCESS LEFT  05/10/2023   IR US GUIDE VASC ACCESS RIGHT  10/11/2021   IR US GUIDE VASC ACCESS RIGHT  05/14/2023   LAPAROSCOPY N/A 05/04/2023   Procedure: LAPAROSCOPY DIAGNOSTIC;  Surgeon: Moise Boring, MD;  Location: [facility] OR;  Service: General;  Laterality: N/A;   LAPAROTOMY N/A 05/04/2023   Procedure: EXPLORATION LAPAROTOMY WITH ILEO-CAECAL RESECTION;  Surgeon: Moise Boring, MD;  Location: [facility] OR;  Service: General;  Laterality: N/A;   LEFT HEART CATH AND CORONARY ANGIOGRAPHY N/A 07/26/2022   Procedure: LEFT HEART CATH AND CORONARY ANGIOGRAPHY;  Surgeon: Swazil[place], Peter M, MD;  [facility] INVASIVE CV LAB;  Service: Cardiovascular;  Laterality: N/A;   LOWER EXTREMITY ANGIOGRAPHY N/A 08/11/2021   Procedure: LOWER EXTREMITY ANGIOGRAPHY;  Surgeon: Nada Libman, MD;  [facility] INVAS[facility] CV LAB;  Service: Cardiovascular;  Laterality: N/A;   REVISON OF ARTERIOVENOUS FISTULA Right 10/23/2021   Procedure: REVISON OF ARTERIOVENOUS FISTULA  ARM AND PLICATION;  Surgeon: Cephus Shelling, MD;  [facility] OR;  Service: Vascular;  Laterality: Right;   TEE WITHOUT CARDIOVERSION N/A 10/02/2021   Procedure: TRANSESOPHAGEAL ECHOCARDIOGRAM (TEE);  Surgeon: Pricilla Riffle, MD;  Location: [facility] ENDOSCOPY;  Service: Cardiovascular;  Laterality: N/A;   TRANSMETATARSAL AMPUTATION Right 08/16/2021   Procedure: CLOSURE OF RIGHT TRANSMETATARSAL AMPUTATION;  Surgeon: Nada Libman, MD;  [facility] OR;  Service: Vascular;  Laterality: Right;    Past Medical History:  Diagnosis Date   Anemia    Anxiety    Blind right eye 2008   Diabetes mellitus without complication (HCC)    Embolic stroke (HCC)    ESRD (end stage renal disease) (HCC)    HD on M/W/F   ESRD on hemodialysis (HCC)    GBS (Guillain Barre syndrome)  (HCC)     Medications:  I have reviewed the patient's current medications.  Medications Prior to Admission  Medication Sig Dispense Refill   acetaminophen (TYLENOL) 325 MG tablet Take 650 mg by mouth every 6 (six) hours as needed for fever or mild pain.     atorvastatin (LIPITOR) 40 MG tablet Place 1 tablet (40 mg total) into feeding tube daily. (Patient taking differently: Take 40 mg by mouth daily.)     azelastine (OPTIVAR) 0.05 % ophthalmic solution Place 1 drop into both eyes 2 (two) times daily as needed (allergies).     cetirizine (ZYRTEC) 10 MG tablet Take 10 mg by mouth daily.     Dorzolamide HCl-Timolol Mal PF 2-0.5 % SOLN Place 1 drop into the left eye in the morning and at bedtime.     dorzolamide-timolol (COSOPT) 22.3-6.8 MG/ML ophthalmic solution Place 1 drop into  the left eye 2 (two) times daily.     dronabinol (MARINOL) 5 MG capsule Take 1 capsule (5 mg total) by mouth 2 (two) times daily before lunch and supper.     glucagon, human recombinant, (GLUCAGEN DIAGNOSTIC) 1 MG injection Inject 1 mg into the vein once as needed for low blood sugar.     HYDROcodone-acetaminophen (NORCO/VICODIN) 5-325 MG tablet Take 1 tablet by mouth every 6 (six) hours as needed for moderate pain. 15 tablet 0   insulin aspart (FIASP FLEXTOUCH) 100 UNIT/ML FlexTouch Pen Inject 1-10 Units into the skin 4 (four) times daily -  before meals and at bedtime. Inject as per sliding scale: If 121-150= 1 units call MD if blood sugar <70 or 400>; 151-200= 2 units 201-250= 3 units 251-300= 5 units 301-350= 7 units 351-400= 10 units Subcutaneously before meals and at bedtime.     Insulin Glargine (BASAGLAR KWIKPEN) 100 UNIT/ML Inject 5 Units into the skin daily.     loperamide (IMODIUM) 2 MG capsule Take 1 capsule (2 mg total) by mouth every 6 (six) hours as needed for diarrhea or loose stools.     midodrine (PROAMATINE) 10 MG tablet Take 1 tablet (10 mg total) by mouth 3 (three) times daily with meals.      Multiple Vitamin (MULTIVITAMIN WITH MINERALS) TABS tablet Take 1 tablet by mouth daily.     ondansetron (ZOFRAN) 4 MG tablet Take 4 mg by mouth every 4 (four) hours as needed for nausea/vomiting.     polycarbophil (FIBERCON) 625 MG tablet Take 2 tablets (1,250 mg total) by mouth 2 (two) times daily.     sevelamer carbonate (RENVELA) 800 MG tablet Take 1,600 mg by mouth 3 (three) times daily.     thiamine 100 MG tablet Place 1 tablet (100 mg total) into feeding tube daily. (Patient taking differently: Take 100 mg by mouth daily.)     Accu-Chek FastClix Lancets MISC Use as instructed to check blood sugar up to TID. E11.22 (Patient taking differently: 1 each by Other route See admin instructions. Use as instructed to check blood sugar up to TID. E11.22) 102 each 11   Continuous Blood Gluc Transmit (DEXCOM G6 TRANSMITTER) MISC 1 Device by Does not apply route as directed. 1 each 3   glucose blood (ACCU-CHEK GUIDE) test strip Use as instructed to check blood sugar up to TID. E11.22 (Patient taking differently: 1 each by Other route See admin instructions. Use as instructed to check blood sugar up to TID. E11.22) 100 each 12   lanthanum (FOSRENOL) 1000 MG chewable tablet Chew 1 tablet (1,000 mg total) by mouth 3 (three) times daily with meals. (Patient not taking: Reported on 05/28/2023)     Nutritional Supplements (NUTRITIONAL DRINK) LIQD Take 120 mLs by mouth daily.      ALLERGIES:   Allergies  Allergen Reactions   Morphine Rash and Other (See Comments)    Per patient MAR.   Peanut-Containing Drug Products Anaphylaxis and Hives   Penicillins Rash and Other (See Comments)    Rash in 2008.  Tolerated cefazolin in 2020. No reaction listed on MAR.   Shellfish Allergy Swelling   Chocolate Hives    FAM HX: Family History  Problem Relation Age of Onset   Diabetes Mother    Diabetes Father     Social History:   reports that she has never smoked. She has never used smokeless tobacco. She reports  that she does not drink alcohol and does not use drugs.  ROS: 12 system ROS unable to obtain due to pt factors  Blood pressure (!) 58/44, pulse (!) 108, temperature 97.6 F (36.4 C), temperature source Oral, resp. rate 18, weight 52.9 kg, SpO2 100%. PHYSICAL EXAM: Gen: chronically ill and uncomfortable appearing  Eyes: blind ENT: MMM Neck: L internal jugular temp trialysis catheter in place CV: tachy and regular Abd:  L mid ostomy stump pink, healthy appearing, nontender abd Lungs: clear GU: purewick, says anuric Extr: bl aka, no wounds or edema, RUE AV access with dialysis needles still in place, +t/b Neuro: yelling and writhing, can answer questions    Results for orders placed or performed during the hospital encounter of 05/27/23 (from the past 48 hour(s))  CBG monitoring, ED     Status: Abnormal   Collection Time: 05/27/23  3:06 PM  Result Value Ref Range   Glucose-Capillary 117 (H) 70 - 99 mg/dL    Comment: Glucose reference range applies only to samples taken after fasting for at least 8 hours.   Comment 1 Notify RN    Comment 2 Document in Chart   CBC with Differential     Status: Abnormal   Collection Time: 05/27/23  3:10 PM  Result Value Ref Range   WBC 12.5 (H) 4.0 - 10.5 K/uL   RBC 2.85 (L) 3.87 - 5.11 MIL/uL   Hemoglobin 7.5 (L) 12.0 - 15.0 g/dL   HCT 10.2 (L) 72.5 - 36.6 %   MCV 88.8 80.0 - 100.0 fL   MCH 26.3 26.0 - 34.0 pg   MCHC 29.6 (L) 30.0 - 36.0 g/dL   RDW 44.0 (H) 34.7 - 42.5 %   Platelets 316 150 - 400 K/uL   nRBC 0.4 (H) 0.0 - 0.2 %   Neutrophils Relative % 85 %   Neutro Abs 10.6 (H) 1.7 - 7.7 K/uL   Lymphocytes Relative 9 %   Lymphs Abs 1.2 0.7 - 4.0 K/uL   Monocytes Relative 3 %   Monocytes Absolute 0.4 0.1 - 1.0 K/uL   Eosinophils Relative 2 %   Eosinophils Absolute 0.3 0.0 - 0.5 K/uL   Basophils Relative 0 %   Basophils Absolute 0.1 0.0 - 0.1 K/uL   Immature Granulocytes 1 %   Abs Immature Granulocytes 0.09 (H) 0.00 - 0.07 K/uL     Comment: Performed at [facility], [address] [place], [place] [address]  Comprehensive metabolic panel     Status: Abnormal   Collection Time: 05/27/23  3:10 PM  Result Value Ref Range   Sodium 137 135 - 145 mmol/L   Potassium 4.0 3.5 - 5.1 mmol/L   Chloride 94 (L) 98 - 111 mmol/L   CO2 24 22 - 32 mmol/L   Glucose, Bld 111 (H) 70 - 99 mg/dL    Comment: Glucose reference range applies only to samples taken after fasting for at least 8 hours.   BUN 19 6 - 20 mg/dL   Creatinine, Ser 7.56 (H) 0.44 - 1.00 mg/dL   Calcium 43.3 8.9 - 29.5 mg/dL   Total Protein 7.5 6.5 - 8.1 g/dL   Albumin 2.4 (L) 3.5 - 5.0 g/dL   AST 59 (H) 15 - 41 U/L   ALT 25 0 - 44 U/L   Alkaline Phosphatase 124 38 - 126 U/L   Total Bilirubin 1.0 0.3 - 1.2 mg/dL   GFR, Estimated 16 (L) >60 mL/min    Comment: (NOTE) Calculated using the CKD-EPI Creatinine Equation (2021)    Anion gap 19 (  H) 5 - 15    Comment: Performed at [facility], [address], [place], [place] [address]  Magnesium     Status: None   Collection Time: 05/27/23  3:10 PM  Result Value Ref Range   Magnesium 1.7 1.7 - 2.4 mg/dL    Comment: Performed at [facility], [address] [place], [place] [address]  Troponin I (High Sensitivity)     Status: Abnormal   Collection Time: 05/27/23  3:10 PM  Result Value Ref Range   Troponin I (High Sensitivity) 22 (H) <18 ng/L    Comment: (NOTE) Elevated high sensitivity troponin I (hsTnI) values and significant  changes across serial measurements may suggest ACS but many other  chronic and acute conditions are known to elevate hsTnI results.  Refer to the "Links" section for chest pain algorithms and additional  guidance. Performed at [facility], [address] [place], [place] [address]   I-Stat venous blood gas, ED     Status: Abnormal   Collection Time: 05/27/23  3:11 PM  Result Value Ref Range   pH, Ven 7.409 7.25 - 7.43   pCO2, Ven 46.4 44 - 60 mmHg   pO2, Ven  25 (LL) 32 - 45 mmHg   Bicarbonate 29.4 (H) 20.0 - 28.0 mmol/L   TCO2 31 22 - 32 mmol/L   O2 Saturation 45 %   Acid-Base Excess 4.0 (H) 0.0 - 2.0 mmol/L   Sodium 136 135 - 145 mmol/L   Potassium 3.9 3.5 - 5.1 mmol/L   Calcium, Ion 1.23 1.15 - 1.40 mmol/L   HCT 29.0 (L) 36.0 - 46.0 %   Hemoglobin 9.9 (L) 12.0 - 15.0 g/dL   Sample type VENOUS    Comment NOTIFIED PHYSICIAN   I-Stat CG4 Lactic Acid, ED     Status: Abnormal   Collection Time: 05/27/23  3:13 PM  Result Value Ref Range   Lactic Acid, Venous 2.4 (HH) 0.5 - 1.9 mmol/L   Comment NOTIFIED PHYSICIAN   I-stat chem 8, ed     Status: Abnormal   Collection Time: 05/27/23  3:14 PM  Result Value Ref Range   Sodium 137 135 - 145 mmol/L   Potassium 3.9 3.5 - 5.1 mmol/L   Chloride 95 (L) 98 - 111 mmol/L   BUN 20 6 - 20 mg/dL   Creatinine, Ser 1.09 (H) 0.44 - 1.00 mg/dL   Glucose, Bld 323 (H) 70 - 99 mg/dL    Comment: Glucose reference range applies only to samples taken after fasting for at least 8 hours.   Calcium, Ion 1.24 1.15 - 1.40 mmol/L   TCO2 27 22 - 32 mmol/L   Hemoglobin 9.9 (L) 12.0 - 15.0 g/dL   HCT 55.7 (L) 32.2 - 02.5 %  MRSA Next Gen by PCR, Nasal     Status: None   Collection Time: 05/27/23  9:21 PM   Specimen: Nasal Mucosa; Nasal Swab  Result Value Ref Range   MRSA by PCR Next Gen NOT DETECTED NOT DETECTED    Comment: (NOTE) The GeneXpert MRSA Assay (FDA approved for NASAL specimens only), is one component of a comprehensive MRSA colonization surveillance program. It is not intended to diagnose MRSA infection nor to guide or monitor treatment for MRSA infections. Test performance is not FDA approved in patients less than 37 years old. Performed at [facility], [address], [place], [place] [address]   Glucose, capillary     Status:  Abnormal   Collection Time: 05/27/23  9:37 PM  Result Value Ref Range   Glucose-Capillary 336 (H) 70 - 99 mg/dL    Comment: Glucose reference range applies only to  samples taken after fasting for at least 8 hours.  Glucose, capillary     Status: Abnormal   Collection Time: 05/27/23 10:01 PM  Result Value Ref Range   Glucose-Capillary 405 (H) 70 - 99 mg/dL    Comment: Glucose reference range applies only to samples taken after fasting for at least 8 hours.  Glucose, capillary     Status: Abnormal   Collection Time: 05/27/23 11:21 PM  Result Value Ref Range   Glucose-Capillary 444 (H) 70 - 99 mg/dL    Comment: Glucose reference range applies only to samples taken after fasting for at least 8 hours.  Glucose, capillary     Status: Abnormal   Collection Time: 05/28/23  3:11 AM  Result Value Ref Range   Glucose-Capillary 333 (H) 70 - 99 mg/dL    Comment: Glucose reference range applies only to samples taken after fasting for at least 8 hours.  Lactic acid, plasma     Status: Abnormal   Collection Time: 05/28/23  4:03 AM  Result Value Ref Range   Lactic Acid, Venous 3.5 (HH) 0.5 - 1.9 mmol/L    Comment: CRITICAL RESULT CALLED TO, READ BACK BY AND VERIFIED WITH Lou Miner RN @ [phone] 05/28/23 JBUTLER Performed at [facility], [address] [place], [place] [address]   CBC     Status: Abnormal   Collection Time: 05/28/23  4:03 AM  Result Value Ref Range   WBC 27.9 (H) 4.0 - 10.5 K/uL   RBC 2.68 (L) 3.87 - 5.11 MIL/uL   Hemoglobin 7.1 (L) 12.0 - 15.0 g/dL   HCT 78.2 (L) 95.6 - 21.3 %   MCV 88.4 80.0 - 100.0 fL   MCH 26.5 26.0 - 34.0 pg   MCHC 30.0 30.0 - 36.0 g/dL   RDW 08.6 (H) 57.8 - 46.9 %   Platelets 416 (H) 150 - 400 K/uL   nRBC 1.0 (H) 0.0 - 0.2 %    Comment: Performed at [facility], [address] [place], [place] [address]  Basic metabolic panel     Status: Abnormal   Collection Time: 05/28/23  4:03 AM  Result Value Ref Range   Sodium 129 (L) 135 - 145 mmol/L    Comment: DELTA CHECK NOTED   Potassium 3.1 (L) 3.5 - 5.1 mmol/L   Chloride 94 (L) 98 - 111 mmol/L   CO2 19 (L) 22 - 32 mmol/L   Glucose, Bld 308 (H) 70 -  99 mg/dL    Comment: Glucose reference range applies only to samples taken after fasting for at least 8 hours.   BUN 21 (H) 6 - 20 mg/dL   Creatinine, Ser 8.41 (H) 0.44 - 1.00 mg/dL   Calcium 32.4 8.9 - 40.1 mg/dL   GFR, Estimated 15 (L) >60 mL/min    Comment: (NOTE) Calculated using the CKD-EPI Creatinine Equation (2021)    Anion gap 16 (H) 5 - 15    Comment: Performed at [facility], [address], [place], [place] [address]  Magnesium     Status: Abnormal   Collection Time: 05/28/23  4:03 AM  Result Value Ref Range   Magnesium 1.5 (L) 1.7 - 2.4 mg/dL    Comment: Performed at [facility], [address], [place],  [place] [address]  Phosphorus     Status: None   Collection Time: 05/28/23  4:03 AM  Result Value Ref Range   Phosphorus 4.0 2.5 - 4.6 mg/dL    Comment: Performed at [facility], [address] [place], [place] [address]  Glucose, capillary     Status: None   Collection Time: 05/28/23  7:57 AM  Result Value Ref Range   Glucose-Capillary 93 70 - 99 mg/dL    Comment: Glucose reference range applies only to samples taken after fasting for at least 8 hours.  Lactic acid, plasma     Status: Abnormal   Collection Time: 05/28/23  9:20 AM  Result Value Ref Range   Lactic Acid, Venous 2.2 (HH) 0.5 - 1.9 mmol/L    Comment: CRITICAL VALUE NOTED. VALUE IS CONSISTENT WITH PREVIOUSLY REPORTED/CALLED VALUE Performed at [facility], [address], [place], [place] [address]   Glucose, capillary     Status: Abnormal   Collection Time: 05/28/23 11:37 AM  Result Value Ref Range   Glucose-Capillary 134 (H) 70 - 99 mg/dL    Comment: Glucose reference range applies only to samples taken after fasting for at least 8 hours.    CT CHEST ABDOMEN PELVIS W CONTRAST  Result Date: 05/27/2023 CLINICAL DATA:  Sepsis. Altered mental status. Open abdominal wound. EXAM: CT CHEST, ABDOMEN, AND PELVIS WITH CONTRAST TECHNIQUE: Multidetector CT imaging of the chest,  abdomen and pelvis was performed following the standard protocol during bolus administration of intravenous contrast. RADIATION DOSE REDUCTION: This exam was performed according to the departmental dose-optimization program which includes automated exposure control, adjustment of the mA and/or kV according to patient size and/or use of iterative reconstruction technique. CONTRAST:  60mL OMNIPAQUE IOHEXOL 350 MG/ML SOLN COMPARISON:  CT abdomen pelvis dated 05/25/2023. FINDINGS: CT CHEST FINDINGS Cardiovascular: There is no cardiomegaly or pericardial effusion. Advanced 3 vessel coronary vascular calcification. Left IJ central venous line with tip at the cavoatrial junction. There is mild atherosclerotic calcification of the thoracic aorta. No aneurysmal dilatation or dissection. The origins of the great vessels of the aortic arch and the central pulmonary arteries appear patent as visualized. Mediastinum/Nodes: No hilar or mediastinal adenopathy. The esophagus is grossly unremarkable. No mediastinal fluid collection. Lungs/Pleura: Clusters of nodular densities with tree-in-bud appearance bilaterally most consistent with an infectious process, possibly atypical in etiology or aspiration. Clinical correlation is recommended. No consolidative changes. There is no pleural effusion or pneumothorax. The central airways are patent. Musculoskeletal: There is diffuse subcutaneous edema. No fluid collection. No acute osseous pathology. 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 CT ABDOMEN PELVIS FINDINGS No intra-abdominal free air or free fluid. Hepatobiliary: The liver is unremarkable. Mild biliary dilatation. High attenuating content within the gallbladder as seen previously and may represent vicarious excretion of contrast versus gallstones. No pericholecystic fluid. Pancreas: The pancreas is atrophic for age. There is mild dilatation of the main pancreatic duct. No active inflammatory  changes. Spleen: Normal in size without focal abnormality. Adrenals/Urinary Tract: The adrenal glands are unremarkable. Extensive renal vascular calcification. There is moderate bilateral renal parenchyma atrophy. There is no hydronephrosis on either side. The urinary bladder is collapsed. There is diffuse thickened appearance of the bladder wall with speckled calcification, likely sequela chronic infection/inflammation. Correlation with urinalysis recommended to evaluate for active cystitis. Stomach/Bowel: Diffusely thickened colon may be related to underdistention or represent pancolitis. Clinical correlation recommended. There is postsurgical changes of the bowel with a right  lower quadrant ileostomy. No bowel obstruction. Vascular/Lymphatic: Advanced aortoiliac atherosclerotic disease. There is extensive calcification of the mesenteric arteries. The IVC is unremarkable. No portal venous gas. There is no adenopathy. Reproductive: The uterus is anteverted. Diffuse calcification of uterine vasculature. No adnexal masses. Other: Midline vertical anterior pelvic wall open surgical wound. No fluid collection. There is diffuse subcutaneous edema. Musculoskeletal: No acute osseous pathology. IMPRESSION: 1. Clusters of nodular densities with tree-in-bud appearance bilaterally most consistent with an infectious process, possibly atypical in etiology or aspiration. 2. Diffusely thickened colon may be related to underdistention or represent pancolitis. 3. 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 No bowel obstruction. 4. Diffuse thickened appearance of the bladder wall. Correlation with urinalysis recommended to evaluate for active cystitis. 5.  Aortic Atherosclerosis (ICD10-I70.0). Electronically Signed   By: Elgie Collard M.D.   On: 05/27/2023 19:23   DG Chest Portable 1 View  Result Date: 05/27/2023 CLINICAL DATA:  Shortness of breath. EXAM: PORTABLE CHEST 1 VIEW COMPARISON:  Most  recent radiograph 05/18/2023 FINDINGS: Left-sided dialysis catheter tip in the right atrium. Heart size upper normal. Stable mediastinal contours. Coronary stent visualized. No focal airspace disease, pleural effusion, pulmonary edema or pneumothorax. IMPRESSION: No acute findings or explanation for shortness of breath. Electronically Signed   By: Narda Rutherford M.D.   On: 05/27/2023 17:26   CT Head Wo Contrast  Result Date: 05/27/2023 CLINICAL DATA:  Headache, neuro deficit AMS unequal pupils EXAM: CT HEAD WITHOUT CONTRAST TECHNIQUE: Contiguous axial images were obtained from the base of the skull through the vertex without intravenous contrast. RADIATION DOSE REDUCTION: This exam was performed according to the departmental dose-optimization program which includes automated exposure control, adjustment of the mA and/or kV according to patient size and/or use of iterative reconstruction technique. COMPARISON:  CT head November 23, 2021. FINDINGS: Brain: Motion limited study. Multifocal cortical hyperdensity within the right greater than left parieto-occipital regions. No significant mass effect. No mass lesion. Patchy white matter hypodensities likely represent chronic microvascular ischemic disease. 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 Vascular: No hyperdense vessel. Skull: No acute fracture. Sinuses/Orbits: Mostly clear sinuses.  No acute orbital findings. Other: No mastoid effusions. IMPRESSION: Multifocal likely cortical hyperdensity involving the right greater than left parieto-occipital regions, suspicious for cortical laminar necrosis due to subacute infarct and/or small volume of acute/recent hemorrhage. No significant mass effect. Recommend MRI brain to further evaluate. Findings discussed with Dr. Ledon Snare via telephone at 5:03 p.m. Electronically Signed   By: Feliberto Harts M.D.   On: 05/27/2023 17:04    Outpt HD: [facility] MWF  three times per week 180 NR, 400/1.5 auto, 2K/2Ca, EDW 49kg, mircera  75 q2wks  Assessment/Plan **Shock: presumed septic, on broad spectrum abx and vasopressor support per PCCM.   **ESRD on HD: rec'd 2hr treatment yesterday - volume and labs ok for today.  Decide on CRRT vs HD tomorrow based on hemodynamics.  Will need to figure out access while in - cannot leave with temp HD line in place.  See HPI.     **Anemia:  Hb 7s, transfuse per primary.  Will consult pharm to dose ESA = she was just here but I don't have a date for last dose.  On mircera 75q2wks outpt.   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  Looks ok.   **Secondary hyperPTH:  Ca at goal. Senispar on hold d/t GI issues. Continue VDRA. Last Phos 4, changed binders from sevelamer d/t SBO to Fosrenol.   **  DM: per primary  **Poss CVA: MRI and neuro c/w pending after CT poss infarcts  Will follow - page/message with concerns.    Tyler Pita 05/28/2023, 2:25 PM

## 2023-05-29 ENCOUNTER — Inpatient Hospital Stay (HOSPITAL_COMMUNITY): Payer: Medicare Other

## 2023-05-29 ENCOUNTER — Other Ambulatory Visit (HOSPITAL_COMMUNITY): Payer: Medicare Other

## 2023-05-29 DIAGNOSIS — Z7189 Other specified counseling: Secondary | ICD-10-CM

## 2023-05-29 DIAGNOSIS — R4182 Altered mental status, unspecified: Secondary | ICD-10-CM

## 2023-05-29 DIAGNOSIS — Z515 Encounter for palliative care: Secondary | ICD-10-CM

## 2023-05-29 DIAGNOSIS — I6389 Other cerebral infarction: Secondary | ICD-10-CM | POA: Diagnosis not present

## 2023-05-29 DIAGNOSIS — R6521 Severe sepsis with septic shock: Secondary | ICD-10-CM | POA: Diagnosis not present

## 2023-05-29 DIAGNOSIS — A419 Sepsis, unspecified organism: Secondary | ICD-10-CM | POA: Diagnosis not present

## 2023-05-29 DIAGNOSIS — G9341 Metabolic encephalopathy: Secondary | ICD-10-CM | POA: Diagnosis not present

## 2023-05-29 DIAGNOSIS — R569 Unspecified convulsions: Secondary | ICD-10-CM | POA: Diagnosis not present

## 2023-05-29 DIAGNOSIS — I959 Hypotension, unspecified: Secondary | ICD-10-CM | POA: Diagnosis not present

## 2023-05-29 LAB — POCT I-STAT 7, (LYTES, BLD GAS, ICA,H+H)
Acid-base deficit: 6 mmol/L — ABNORMAL HIGH (ref 0.0–2.0)
Bicarbonate: 18.6 mmol/L — ABNORMAL LOW (ref 20.0–28.0)
Calcium, Ion: 1.51 mmol/L (ref 1.15–1.40)
HCT: 23 % — ABNORMAL LOW (ref 36.0–46.0)
Hemoglobin: 7.8 g/dL — ABNORMAL LOW (ref 12.0–15.0)
O2 Saturation: 100 %
Patient temperature: 99.5
Potassium: 5.4 mmol/L — ABNORMAL HIGH (ref 3.5–5.1)
Sodium: 128 mmol/L — ABNORMAL LOW (ref 135–145)
TCO2: 20 mmol/L — ABNORMAL LOW (ref 22–32)
pCO2 arterial: 31.4 mm[Hg] — ABNORMAL LOW (ref 32–48)
pH, Arterial: 7.383 (ref 7.35–7.45)
pO2, Arterial: 256 mm[Hg] — ABNORMAL HIGH (ref 83–108)

## 2023-05-29 LAB — CBC
HCT: 21.4 % — ABNORMAL LOW (ref 36.0–46.0)
Hemoglobin: 6.6 g/dL — CL (ref 12.0–15.0)
MCH: 26.7 pg (ref 26.0–34.0)
MCHC: 30.8 g/dL (ref 30.0–36.0)
MCV: 86.6 fL (ref 80.0–100.0)
Platelets: 308 10*3/uL (ref 150–400)
RBC: 2.47 MIL/uL — ABNORMAL LOW (ref 3.87–5.11)
RDW: 20.3 % — ABNORMAL HIGH (ref 11.5–15.5)
WBC: 16.6 10*3/uL — ABNORMAL HIGH (ref 4.0–10.5)
nRBC: 1.2 % — ABNORMAL HIGH (ref 0.0–0.2)

## 2023-05-29 LAB — ECHOCARDIOGRAM COMPLETE
AR max vel: 2.02 cm2
AV Area VTI: 1.87 cm2
AV Area mean vel: 1.89 cm2
AV Mean grad: 2 mm[Hg]
AV Peak grad: 3.6 mm[Hg]
Ao pk vel: 0.95 m/s
Area-P 1/2: 6.83 cm2
S' Lateral: 3 cm
Weight: 1929.47 [oz_av]

## 2023-05-29 LAB — RENAL FUNCTION PANEL
Albumin: 1.9 g/dL — ABNORMAL LOW (ref 3.5–5.0)
Anion gap: 13 (ref 5–15)
BUN: 28 mg/dL — ABNORMAL HIGH (ref 6–20)
CO2: 18 mmol/L — ABNORMAL LOW (ref 22–32)
Calcium: 10.8 mg/dL — ABNORMAL HIGH (ref 8.9–10.3)
Chloride: 97 mmol/L — ABNORMAL LOW (ref 98–111)
Creatinine, Ser: 4.15 mg/dL — ABNORMAL HIGH (ref 0.44–1.00)
GFR, Estimated: 13 mL/min — ABNORMAL LOW (ref >60)
Glucose, Bld: 130 mg/dL — ABNORMAL HIGH (ref 70–99)
Phosphorus: 5.8 mg/dL — ABNORMAL HIGH (ref 2.5–4.6)
Potassium: 5.2 mmol/L — ABNORMAL HIGH (ref 3.5–5.1)
Sodium: 128 mmol/L — ABNORMAL LOW (ref 135–145)

## 2023-05-29 LAB — LIPID PANEL
Cholesterol: 60 mg/dL (ref 0–200)
HDL: 31 mg/dL — ABNORMAL LOW (ref >40)
LDL Cholesterol: 16 mg/dL (ref 0–99)
Total CHOL/HDL Ratio: 1.9 {ratio}
Triglycerides: 66 mg/dL (ref <150)
VLDL: 13 mg/dL (ref 0–40)

## 2023-05-29 LAB — HEMOGLOBIN AND HEMATOCRIT, BLOOD
HCT: 27.2 % — ABNORMAL LOW (ref 36.0–46.0)
Hemoglobin: 8.9 g/dL — ABNORMAL LOW (ref 12.0–15.0)

## 2023-05-29 LAB — GLUCOSE, CAPILLARY
Glucose-Capillary: 121 mg/dL — ABNORMAL HIGH (ref 70–99)
Glucose-Capillary: 115 mg/dL — ABNORMAL HIGH (ref 70–99)
Glucose-Capillary: 111 mg/dL — ABNORMAL HIGH (ref 70–99)
Glucose-Capillary: 113 mg/dL — ABNORMAL HIGH (ref 70–99)
Glucose-Capillary: 122 mg/dL — ABNORMAL HIGH (ref 70–99)
Glucose-Capillary: 85 mg/dL (ref 70–99)

## 2023-05-29 LAB — HEPATITIS B SURFACE ANTIGEN: Hepatitis B Surface Ag: NONREACTIVE

## 2023-05-29 LAB — PREPARE RBC (CROSSMATCH)

## 2023-05-29 MED ORDER — LIDOCAINE-PRILOCAINE 2.5-2.5 % EX CREA: 1.0000 | TOPICAL_CREAM | CUTANEOUS | Status: DC | PRN

## 2023-05-29 MED ORDER — ATORVASTATIN CALCIUM 10 MG PO TABS
20.0000 mg | ORAL_TABLET | Freq: Every day | ORAL | Status: DC
  Administered 2023-05-29 – 2023-06-17 (×20): 20 mg via ORAL
  Filled 2023-05-29 (×20): qty 2

## 2023-05-29 MED ORDER — HEPARIN SODIUM (PORCINE) 1000 UNIT/ML DIALYSIS
1000.0000 [IU] | INTRAMUSCULAR | Status: DC | PRN
  Filled 2023-05-29: qty 1

## 2023-05-29 MED ORDER — DARBEPOETIN ALFA 60 MCG/0.3ML IJ SOSY
60.0000 ug | PREFILLED_SYRINGE | INTRAMUSCULAR | Status: DC
  Administered 2023-05-31 – 2023-06-14 (×2): 60 ug via SUBCUTANEOUS
  Filled 2023-05-29 (×2): qty 0.3

## 2023-05-29 MED ORDER — OXYCODONE HCL 5 MG PO TABS
5.0000 mg | ORAL_TABLET | ORAL | Status: DC | PRN
  Administered 2023-05-29 – 2023-06-17 (×46): 5 mg via ORAL
  Filled 2023-05-29 (×47): qty 1

## 2023-05-29 MED ORDER — CHLORHEXIDINE GLUCONATE CLOTH 2 % EX PADS
6.0000 | MEDICATED_PAD | Freq: Every day | CUTANEOUS | Status: DC
  Administered 2023-05-30 – 2023-06-05 (×7): 6 via TOPICAL

## 2023-05-29 MED ORDER — FENTANYL CITRATE PF 50 MCG/ML IJ SOSY
25.0000 ug | PREFILLED_SYRINGE | INTRAMUSCULAR | Status: DC | PRN
  Administered 2023-05-29 – 2023-05-30 (×5): 50 ug via INTRAVENOUS
  Filled 2023-05-29 (×5): qty 1

## 2023-05-29 MED ORDER — SODIUM CHLORIDE 0.9% IV SOLUTION: Freq: Once | INTRAVENOUS | Status: AC

## 2023-05-29 MED ORDER — ANTICOAGULANT SODIUM CITRATE 4% (200MG/5ML) IV SOLN: 5.0000 mL | Status: DC | PRN

## 2023-05-29 MED ORDER — LIDOCAINE HCL (PF) 1 % IJ SOLN: 5.0000 mL | INTRAMUSCULAR | Status: DC | PRN

## 2023-05-29 MED ORDER — PENTAFLUOROPROP-TETRAFLUOROETH EX AERO: 1.0000 | INHALATION_SPRAY | CUTANEOUS | Status: DC | PRN

## 2023-05-29 MED ORDER — ALTEPLASE 2 MG IJ SOLR: 2.0000 mg | Freq: Once | INTRAMUSCULAR | Status: DC | PRN

## 2023-05-29 NOTE — Progress Notes (Signed)
*  PRELIMINARY RESULTS* Echocardiogram 2D Echocardiogram has been performed.  [name] 05/29/2023, 12:44 PM

## 2023-05-29 NOTE — Consult Note (Signed)
Consultation Note Date: 05/29/2023   Patient Name: [name]  DOB: [birth year]  MRN: [id number]  Age / Sex: 42 y.o., female  PCP: [facility] Referring Physician: Karren Burly, MD  Reason for Consultation: Establishing goals of care  HPI/Patient Profile: 42 y.o. female  with past medical history of insulin-dependent diabetes mellitus, resultant end-stage renal disease, bilateral AKA, blindness and severe peripheral arterial disease admitted on 05/26/2023 with hypotension in dialysis.   She had a recent prolonged hospital stay in September 2024 for small bowel ischemia with resultant small bowel obstruction. She had an extended ileal cecotomy placed. She was discharged to SNF 3 days before this admission.  Per chart, patient was not allowing dressing changes for her abdominal wound.  She is now admitted with septic shock secondary to suspected wound infection and acute encephalopathy.  PMT has been consulted to assist with goals of care conversation.  Clinical Assessment and Goals of Care:  I have reviewed medical records including EPIC notes, labs and imaging, discussed with RN who is a, assessed the patient and then met at the bedside to discuss diagnosis prognosis, GOC, EOL wishes, disposition and options.  Attempted to call her husband to schedule a family meeting at her request but was unable to reach.  I introduced Palliative Medicine as specialized medical care for people living with serious illness. It focuses on providing relief from the symptoms and stress of a serious illness. The goal is to improve quality of life for both the patient and the family.  We discussed a brief life review of the patient and then focused on their current illness.   I attempted to elicit values and goals of care important to the patient.    Medical History Review and Understanding:   Patient states "I am hurt" when asked to describe her understanding of her current situation.  With her permission, I provided medical update and explanation of the care plan, reviewing concern for her long-term prognosis.  Social History: Patient is married.  She resides at long-term care facility.  Functional and Nutritional State: Per chart, patient is bedbound at baseline.  Albumin of 1.9 noted.  Palliative Symptoms: Patient denies any symptoms at this time  Advance Directives: A detailed discussion regarding advanced directives was had.  Reviewed documentation on file which indicates [name] Excell Seltzer is HCPOA.  Per chart, this is her sister.  Patient denies knowing who this person is.  She indicates preference for her spouse to make decisions.  Code Status: Concepts specific to code status, artifical feeding and hydration, and rehospitalization were considered and discussed.  Encouraged patient to consider her wishes and explored whether she still desires full code.  She does not indicate a preference at this time.  I encouraged her to continue reflection in anticipation of a family meeting.  Discussion: Upon review of her acute illness and current care plan, patient responds by saying "is that what they are saying?"  I gently encouraged her to consider all possible outcomes and her care preferences given the severity of her illness.  Reviewed the possibility that she may not tolerate dialysis much longer and that decisions may need to be made regarding CRRT.  She did not engage very much in conversation with me.  Explained that the care team was attempting to delineate her priorities, given her hesitancy to receive interventions on her wound despite this being of great importance to her overall health.  She states "yes" in response to my encouragement  to consider and share her goals of care moving forward.   Offered to arrange for a family meeting with her preferred support people present to  allow her to process information and feel confident about her informed decisions.  She is agreeable to this PA attempting to reach her husband.  Unfortunately, I was unable to reach him today and it appears the number on file is not in service.  Attempted to reach HCPOA/sister as well but this phone number also could not be reached.   Discussed the importance of continued conversation with family and the medical providers regarding overall plan of care and treatment options, ensuring decisions are within the context of the patient's values and GOCs.   Questions and concerns were addressed.  PMT will continue to support holistically.   SUMMARY OF RECOMMENDATIONS   -Continue full code/full scope treatment -It is unclear how much insight patient has into her current situation -Patient claims she does not know her HCPOA, Astronomer.  She prefers I attempt to schedule a family meeting with her husband -Attempted to call patient's husband but was unable to reach or leave voicemail (received error message) -Attempted to call patient's sister/HCPOA but this phone number was also not in service -Continue efforts at discussing goals of care with patient -PMT will continue to follow and support   Prognosis:  Guarded to poor given several chronic comorbidities, repeat hospitalizations, concerns with adherence to care plan  Discharge Planning: To Be Determined      Primary Diagnoses: Present on Admission:  Septic shock [facility])   Physical Exam Vitals and nursing note reviewed.  Constitutional:      General: She is not in acute distress.    Appearance: She is ill-appearing.     Interventions: Nasal cannula in place.  Cardiovascular:     Rate and Rhythm: Normal rate.  Pulmonary:     Effort: Pulmonary effort is normal. No respiratory distress.  Psychiatric:        Mood and Affect: Affect is flat.        Behavior: Behavior is withdrawn.    Vital Signs: BP (!) 58/44   Pulse 79   Temp 99 F  (37.2 C) (Axillary)   Resp 14   Wt 54.7 kg   SpO2 100%   BMI 22.79 kg/m  Pain Scale: 0-10   Pain Score: 0-No pain   SpO2: SpO2: 100 % O2 Device:SpO2: 100 % O2 Flow Rate: .O2 Flow Rate (L/min): 2 L/min   Palliative Assessment/Data: TBD     MDM: High   Korin Hartwell Jeni Salles, PA-C  Palliative Medicine Team Team phone # [phone]  Thank you for allowing the Palliative Medicine Team to assist in the care of this patient. Please utilize secure chat with additional questions, if there is no response within 30 minutes please call the above phone number.  Palliative Medicine Team providers are available by phone from 7am to 7pm daily and can be reached through the team cell phone.  Should this patient require assistance outside of these hours, please call the patient's attending physician.

## 2023-05-29 NOTE — Progress Notes (Signed)
EEG complete - results pending 

## 2023-05-29 NOTE — Progress Notes (Signed)
OT Cancellation Note  Patient Details Name: Sydnee Lamour MRN: [id number] DOB: 09-21-1980   Cancelled Treatment:    Reason Eval/Treat Not Completed: OT screened, no needs identified, will sign off (Pt with baseline dependence in self care and all mobility. No acute OT needs.)  Evern Bio 05/29/2023, 12:15 PM Berna Spare, OTR/L Acute Rehabilitation Services Office: [phone]

## 2023-05-29 NOTE — Procedures (Signed)
Patient Name: Florentine Diekman  MRN: [id number]  Epilepsy Attending: Charlsie Quest  Referring Physician/Provider: Marvel Plan, MD  Date: 05/29/2023 Duration: 25.11 mins  Patient history: 42yo F with ams getting eeg to evaluate for seizure  Level of alertness: comatose/ lethargic   AEDs during EEG study: None  Technical aspects: This EEG study was done with scalp electrodes positioned according to the 10-20 International system of electrode placement. Electrical activity was reviewed with band pass filter of 1-70Hz , sensitivity of 7 uV/mm, display speed of 55mm/sec with a 60Hz  notched filter applied as appropriate. EEG data were recorded continuously and digitally stored.  Video monitoring was available and reviewed as appropriate.  Description: EEG showed continuous generalized and maximal right fronto-parietal rhythmic 2-3hz  delta slowing. Hyperventilation and photic stimulation were not performed.     ABNORMALITY - Continuous slow, generalized and maximal right fronto-parietal   IMPRESSION: This study is suggestive of cortical dysfunction arising from right fronto-parietal, likely secondary to underlying structural abnormality. Additionally there is severe diffuse encephalopathy. No seizures or epileptiform discharges were seen throughout the recording.  Lincoln Ginley Annabelle Harman

## 2023-05-29 NOTE — Progress Notes (Signed)
STROKE TEAM PROGRESS NOTE   SUBJECTIVE (INTERVAL HISTORY) Her RN is at the bedside.  Overall her condition is gradually improving. Per Dr. Judeth Horn, pt mental status much improved but intermittently agitated, emotional irritable, more like her baseline now.   OBJECTIVE Temp:  [97.2 F (36.2 C)-99 F (37.2 C)] 99 F (37.2 C) (10/16 1159) Pulse Rate:  [62-105] 79 (10/16 1144) Cardiac Rhythm: Normal sinus rhythm (10/16 0800) Resp:  [10-20] 14 (10/16 1200) SpO2:  [85 %-100 %] 100 % (10/16 1200) Arterial Line BP: (77-196)/(32-73) 120/54 (10/16 1200) Weight:  [54.7 kg] 54.7 kg (10/16 0247)  Recent Labs  Lab 05/28/23 2159 05/28/23 2318 05/29/23 0321 05/29/23 0744 05/29/23 1139  GLUCAP 102* 88 121* 115* 111*   Recent Labs  Lab 05/24/23 0549 05/25/23 1159 05/19/2023 1510 05/16/2023 1511 05/14/2023 1514 05/28/23 0403 05/29/23 0505 05/29/23 0733  NA 133* 134* 137 136 137 129* 128* 128*  K 4.2 4.5 4.0 3.9 3.9 3.1* 5.4* 5.2*  CL 94* 91* 94*  --  95* 94*  --  97*  CO2 25 20* 24  --   --  19*  --  18*  GLUCOSE 175* 282* 111*  --  113* 308*  --  130*  BUN 20 18 19   --  20 21*  --  28*  CREATININE 4.22* 3.98* 3.48*  --  3.70* 3.78*  --  4.15*  CALCIUM 10.8* 11.7* 10.0  --   --  10.1  --  10.8*  MG  --   --  1.7  --   --  1.5*  --   --   PHOS 5.1*  --   --   --   --  4.0  --  5.8*   Recent Labs  Lab 05/24/23 0549 05/25/23 1159 05/29/2023 1510 05/29/23 0733  AST  --  14* 59*  --   ALT  --  13 25  --   ALKPHOS  --  101 124  --   BILITOT  --  1.0 1.0  --   PROT  --  8.4* 7.5  --   ALBUMIN 2.4* 2.8* 2.4* 1.9*   Recent Labs  Lab 05/24/23 0549 05/25/23 1159 06/13/2023 1510 05/30/2023 1511 05/26/2023 1514 05/28/23 0403 05/29/23 0505 05/29/23 0733  WBC 10.5 12.6* 12.5*  --   --  27.9*  --  16.6*  NEUTROABS 7.2 10.3* 10.6*  --   --   --   --   --   HGB 9.4* 8.5* 7.5* 9.9* 9.9* 7.1* 7.8* 6.6*  HCT 31.1* 30.2* 25.3* 29.0* 29.0* 23.7* 23.0* 21.4*  MCV 89.1 91.5 88.8  --   --  88.4  --   86.6  PLT 334 349 316  --   --  416*  --  308        Component Value Date/Time   CHOL 60 05/29/2023 0322   CHOL 195 05/18/2020 0927   TRIG 66 05/29/2023 0322   HDL 31 (L) 05/29/2023 0322   HDL 55 05/18/2020 0927   CHOLHDL 1.9 05/29/2023 0322   VLDL 13 05/29/2023 0322   LDLCALC 16 05/29/2023 0322   LDLCALC 125 (H) 05/18/2020 0927   Lab Results  Component Value Date   HGBA1C 5.7 (H) 04/30/2023    I have personally reviewed the radiological images below and agree with the radiology interpretations.  CT ANGIO HEAD NECK W WO CM  Result Date: 05/29/2023 CLINICAL DATA:  Stroke, determine embolic source. EXAM: CT ANGIOGRAPHY HEAD AND NECK WITH  AND WITHOUT CONTRAST TECHNIQUE: Multidetector CT imaging of the head and neck was performed using the standard protocol during bolus administration of intravenous contrast. Multiplanar CT image reconstructions and MIPs were obtained to evaluate the vascular anatomy. Carotid stenosis measurements (when applicable) are obtained utilizing NASCET criteria, using the distal internal carotid diameter as the denominator. RADIATION DOSE REDUCTION: This exam was performed according to the departmental dose-optimization program which includes automated exposure control, adjustment of the mA and/or kV according to patient size and/or use of iterative reconstruction technique. 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 Chronic small vessel ischemic gliosis in the cerebral white matter with chronic perforator infarct in the left frontal white matter better seen on prior MRI. No evidence of acute hemorrhage, hydrocephalus, or mass. Generalized brain atrophy, premature for age.  Vascular: No hyperdense vessel or unexpected calcification. Skull: Sclerotic skull attributed to the end-stage renal disease. Sinuses/Orbits: Chronic left mastoid opacification with under pneumatization and sclerosis. Review of the MIP images confirms the above findings CTA NECK FINDINGS Aortic arch: Unremarkable Right carotid system: Mild atheromatous plaque at the bifurcation. No stenosis or ulceration. Left carotid system: Mild atheromatous plaque at the bifurcation. No stenosis or ulceration. Vertebral arteries: No proximal subclavian stenosis. Scattered calcified plaque without flow reducing stenosis, beading, or ulceration. Skeleton: Negative Other neck: Scattered intravenous gas attributed to IV access. Small right globe with signs of chronic sub retinal collection. Upper chest: Small branching micro nodules in the apical lungs. Review of the MIP images confirms the above findings CTA HEAD FINDINGS Anterior circulation: Atheromatous calcification of the cavernous carotids to the degree that lumen visualization is difficult. No branch occlusion, beading, aneurysm, or proximal flow reducing stenosis. Posterior circulation: Vertebral and basilar arteries are smoothly contoured and widely patent. Calcified plaque is seen along the bilateral V4 segments. No branch occlusion, beading, aneurysm, or proximal flow reducing stenosis. Venous sinuses: Unremarkable Anatomic variants: None significant Review of the MIP images confirms the above findings IMPRESSION: Head CT: 1. Cortical calcification involving the right more than left posterior cerebral hemispheres, likely from ischemia or PRES and accentuated by end-stage renal disease with calcium dysregulation. 2. No acute hemorrhage. CTA: 1. No emergent vascular finding. 2. Atherosclerosis without flow reducing stenosis or ulceration of major arteries in the head and neck. Limited assessment of the cavernous carotids due to the degree of calcified plaque obscuring the  lumen. 3. Micro nodules in the apical lungs suggesting bronchiolitis, correlate for acute infectious symptoms. Electronically Signed   By: Tiburcio Pea M.D.   On: 05/29/2023 06:49   MR BRAIN WO CONTRAST  Result Date: 05/28/2023 CLINICAL DATA:  Stroke suspected (Ped 0-17y) EXAM: MRI HEAD WITHOUT CONTRAST TECHNIQUE: Multiplanar, multiecho pulse sequences of the brain and surrounding structures were obtained without intravenous contrast. COMPARISON:  Same day CT head FINDINGS: Limitations: Sagittal T1 weighted sequences were not acquired. Brain: There is a punctate acute infarct in the right frontal lobe (series 2, image 42). Cortical hyperintense signal on diffusion-weighted imaging in the right parieto-occipital region is favored to represent regions of acute to subacute infarct. The hyperdense appearance seen on same day head CT likely represents infarct related enhancement. There are chronic infarcts in the left cerebellum in the left frontal lobe. Findings  in the left parietal lobe likely represent changes related to cortical laminar necrosis. No acute hemorrhage. No hydrocephalus. No extra-axial fluid collection. No mass effect. No mass lesion. There are additional small punctate microhemorrhages predominantly in a central location, possibly secondary to hypertensive microangiopathy. Vascular: Normal flow voids. Skull and upper cervical spine: Normal marrow signal. Sinuses/Orbits: No mastoid or middle ear effusion. Mild mucosal thickening of the floor of the right maxillary sinus. Bilateral lens replacement. There are chronic changes involving the right globe with likely chronic layering vitreal blood products on the right. Other: None. IMPRESSION: 1. Punctate acute infarct in the right frontal lobe. 2. Cortical hyperintense signal on diffusion-weighted imaging in the right parieto-occipital region is favored to represent regions of acute to subacute infarct. The hyperdense appearance seen on same day  head CT likely represents infarct related enhancement. Further evaluation with contrast-enhanced brain MRI is recommended. 3. 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 4. 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 These results will be called to the ordering clinician or representative by the Radiologist Assistant, and communication documented in the PACS or [employer]. Electronically Signed   By: Lorenza Cambridge M.D.   On: 05/28/2023 15:16   CT CHEST ABDOMEN PELVIS W CONTRAST  Result Date: 05/25/2023 CLINICAL DATA:  Sepsis. Altered mental status. Open abdominal wound. EXAM: CT CHEST, ABDOMEN, AND PELVIS WITH CONTRAST TECHNIQUE: Multidetector CT imaging of the chest, abdomen and pelvis was performed following the standard protocol during bolus administration of intravenous contrast. RADIATION DOSE REDUCTION: This exam was performed according to the departmental dose-optimization program which includes automated exposure control, adjustment of the mA and/or kV according to patient size and/or use of iterative reconstruction technique. CONTRAST:  60mL OMNIPAQUE IOHEXOL 350 MG/ML SOLN COMPARISON:  CT abdomen pelvis dated 05/25/2023. FINDINGS: CT CHEST FINDINGS Cardiovascular: There is no cardiomegaly or pericardial effusion. Advanced 3 vessel coronary vascular calcification. Left IJ central venous line with tip at the cavoatrial junction. There is mild atherosclerotic calcification of the thoracic aorta. No aneurysmal dilatation or dissection. The origins of the great vessels of the aortic arch and the central pulmonary arteries appear patent as visualized. Mediastinum/Nodes: No hilar or mediastinal adenopathy. The esophagus is grossly unremarkable. No mediastinal fluid collection. Lungs/Pleura: Clusters of nodular densities with tree-in-bud appearance bilaterally most consistent with an infectious process, possibly atypical in etiology or aspiration. Clinical  correlation is recommended. No consolidative changes. There is no pleural effusion or pneumothorax. The central airways are patent. Musculoskeletal: There is diffuse subcutaneous edema. No fluid collection. No acute osseous pathology. 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 CT ABDOMEN PELVIS FINDINGS No intra-abdominal free air or free fluid. Hepatobiliary: The liver is unremarkable. Mild biliary dilatation. High attenuating content within the gallbladder as seen previously and may represent vicarious excretion of contrast versus gallstones. No pericholecystic fluid. Pancreas: The pancreas is atrophic for age. There is mild dilatation of the main pancreatic duct. No active inflammatory changes. Spleen: Normal in size without focal abnormality. Adrenals/Urinary Tract: The adrenal glands are unremarkable. Extensive renal vascular calcification. There is moderate bilateral renal parenchyma atrophy. There is no hydronephrosis on either side. The urinary bladder is collapsed. There is diffuse thickened appearance of the bladder wall with speckled calcification, likely sequela chronic infection/inflammation. Correlation with urinalysis recommended to evaluate for active cystitis. Stomach/Bowel: Diffusely thickened colon may be related to underdistention or represent pancolitis. Clinical correlation recommended. There is postsurgical changes of the bowel with  a right lower quadrant ileostomy. No bowel obstruction. Vascular/Lymphatic: Advanced aortoiliac atherosclerotic disease. There is extensive calcification of the mesenteric arteries. The IVC is unremarkable. No portal venous gas. There is no adenopathy. Reproductive: The uterus is anteverted. Diffuse calcification of uterine vasculature. No adnexal masses. Other: Midline vertical anterior pelvic wall open surgical wound. No fluid collection. There is diffuse subcutaneous edema. Musculoskeletal: No acute osseous pathology.  IMPRESSION: 1. Clusters of nodular densities with tree-in-bud appearance bilaterally most consistent with an infectious process, possibly atypical in etiology or aspiration. 2. Diffusely thickened colon may be related to underdistention or represent pancolitis. 3. 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 No bowel obstruction. 4. Diffuse thickened appearance of the bladder wall. Correlation with urinalysis recommended to evaluate for active cystitis. 5.  Aortic Atherosclerosis (ICD10-I70.0). Electronically Signed   By: Elgie Collard M.D.   On: 06/09/2023 19:23   DG Chest Portable 1 View  Result Date: 06/01/2023 CLINICAL DATA:  Shortness of breath. EXAM: PORTABLE CHEST 1 VIEW COMPARISON:  Most recent radiograph 05/18/2023 FINDINGS: Left-sided dialysis catheter tip in the right atrium. Heart size upper normal. Stable mediastinal contours. Coronary stent visualized. No focal airspace disease, pleural effusion, pulmonary edema or pneumothorax. IMPRESSION: No acute findings or explanation for shortness of breath. Electronically Signed   By: Narda Rutherford M.D.   On: 06/02/2023 17:26   CT Head Wo Contrast  Result Date: 05/21/2023 CLINICAL DATA:  Headache, neuro deficit AMS unequal pupils EXAM: CT HEAD WITHOUT CONTRAST TECHNIQUE: Contiguous axial images were obtained from the base of the skull through the vertex without intravenous contrast. RADIATION DOSE REDUCTION: This exam was performed according to the departmental dose-optimization program which includes automated exposure control, adjustment of the mA and/or kV according to patient size and/or use of iterative reconstruction technique. COMPARISON:  CT head November 23, 2021. FINDINGS: Brain: Motion limited study. Multifocal cortical hyperdensity within the right greater than left parieto-occipital regions. No significant mass effect. No mass lesion. Patchy white matter hypodensities likely represent chronic microvascular  ischemic disease. 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 Vascular: No hyperdense vessel. Skull: No acute fracture. Sinuses/Orbits: Mostly clear sinuses.  No acute orbital findings. Other: No mastoid effusions. IMPRESSION: Multifocal likely cortical hyperdensity involving the right greater than left parieto-occipital regions, suspicious for cortical laminar necrosis due to subacute infarct and/or small volume of acute/recent hemorrhage. No significant mass effect. Recommend MRI brain to further evaluate. Findings discussed with Dr. Ledon Snare via telephone at 5:03 p.m. Electronically Signed   By: Feliberto Harts M.D.   On: 05/18/2023 17:04   CT ABDOMEN PELVIS W CONTRAST  Result Date: 05/25/2023 CLINICAL DATA:  Abdominal pain and weakness. EXAM: CT ABDOMEN AND PELVIS WITH CONTRAST TECHNIQUE: Multidetector CT imaging of the abdomen and pelvis was performed using the standard protocol following bolus administration of intravenous contrast. RADIATION DOSE REDUCTION: This exam was performed according to the departmental dose-optimization program which includes automated exposure control, adjustment of the mA and/or kV according to patient size and/or use of iterative reconstruction technique. CONTRAST:  75mL OMNIPAQUE IOHEXOL 350 MG/ML SOLN COMPARISON:  CT abdomen pelvis dated May 18, 2023. FINDINGS: Lower chest: No acute abnormality. Hepatobiliary: No focal liver abnormality. Excreted contrast within the gallbladder with few tiny filling defects consistent gallstones. No gallbladder wall thickening. Slightly increased mild common bile duct dilatation measuring 10 mm in diameter, previously 9 mm. Pancreas: Unremarkable. No pancreatic ductal dilatation or surrounding inflammatory changes. 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 No new focal abnormality. Normal  size. Adrenals/Urinary Tract: Adrenal glands are unremarkable. Unchanged bilateral renal atrophy. No renal mass, calculi, or hydronephrosis. Excreted  contrast within the decompressed bladder. Stomach/Bowel: The stomach is within normal limits. 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 No bowel wall thickening, distention, or surrounding inflammatory changes. Vascular/Lymphatic: Marked aortoiliac and branch vessel atherosclerotic calcification again noted. No enlarged abdominal or pelvic lymph nodes. Reproductive: Unchanged 3.6 cm degenerating fibroid in the right uterus. No adnexal mass. Other: No free fluid or pneumoperitoneum. Musculoskeletal: Partial interval healing of the midline anterior abdominal wall incision with smaller defect compared to prior study. No acute or significant osseous findings. IMPRESSION: 1. No acute intra-abdominal process. 2. Slightly increased mild common bile duct dilatation measuring 10 mm in diameter, previously 9 mm. Correlate with LFTs. 3. Cholelithiasis. 4.  Aortic Atherosclerosis (ICD10-I70.0). Electronically Signed   By: Obie Dredge M.D.   On: 05/25/2023 14:07   CT ABDOMEN PELVIS W CONTRAST  Result Date: 05/18/2023 CLINICAL DATA:  Postop abdominal pain. EXAM: CT ABDOMEN AND PELVIS WITH CONTRAST TECHNIQUE: Multidetector CT imaging of the abdomen and pelvis was performed using the standard protocol following bolus administration of intravenous contrast. RADIATION DOSE REDUCTION: This exam was performed according to the departmental dose-optimization program which includes automated exposure control, adjustment of the mA and/or kV according to patient size and/or use of iterative reconstruction technique. CONTRAST:  75mL OMNIPAQUE IOHEXOL 350 MG/ML SOLN COMPARISON:  05/09/2023. FINDINGS: Lower chest: Lung bases clear. No pleural or pericardial effusion. There are pacer wires. Hepatobiliary: Gallbladder is contracted and contains hyperdense contents that could be calcific stones or related to excreted contrast from prior study. Pancreas: Unremarkable. No pancreatic  ductal dilatation or surrounding inflammatory changes. Spleen: Medial hypoattenuation similar to the prior study appears to be a perfusion anomaly. This is stable. No splenomegaly. Adrenals/Urinary Tract: Extensive vascular calcifications of both kidneys with no focal lesions or hydronephrosis identified. No adrenal lesions. Urinary bladder is empty and not well evaluated. Stomach/Bowel: Right lower quadrant ostomy. Diffuse colon wall thickening consistent with colitis. Mild dilatation of small bowel in the lower right abdomen. Vascular/Lymphatic: Marked extensive diffuse atheromatous disease of the aorta and other arterial structures. No evidence of aneurysm. Reproductive: Uterine cavity contains fluid. No definite adnexal pathology, but the evaluation is limited. Other: Deep wide anterior abdominal wall open defect. Musculoskeletal: No acute or significant osseous findings. IMPRESSION: 1. Large anterior abdominal wall open defect. 2. Diffuse colon wall thickening consistent with colitis. 3. 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 4. Gallbladder contracted containing stones or excreted contrast. 5. Marked atheromatous disease. 6. Mild small-bowel dilatation right lower abdomen proximal to right lower quadrant ileostomy. 7. Nonspecific fluid attenuation in the uterine cavity. Electronically Signed   By: Layla Maw M.D.   On: 05/18/2023 18:59   DG CHEST PORT 1 VIEW  Result Date: 05/18/2023 CLINICAL DATA:  Hypoxia EXAM: PORTABLE CHEST 1 VIEW COMPARISON:  04/30/2023 FINDINGS: Dialysis catheter from a left-sided approach terminates at the mid to low right atrium. Patient rotated minimally left. Midline trachea. Normal heart size. No pleural effusion or pneumothorax. Clear lungs. IMPRESSION: No active disease. Electronically Signed   By: Jeronimo Greaves M.D.   On: 05/18/2023 14:30   IR AV DIALY SHUNT INTRO NEEDLE/INTRACATH INITIAL W/PTA/IMG RIGHT  Result Date: 05/14/2023 INDICATION:  42 year old with end-stage renal disease. Unable to cannulate right upper arm fistula due to clotting. Thrombus noted in the proximal to mid upper arm on vascular ultrasound. EXAM: 1.  Right upper extremity fistulogram 2. Ultrasound guidance for vascular access 3. Balloon angioplasty of cephalic vein and central veins MEDICATIONS: Versed 0.5 mg ANESTHESIA/SEDATION: The patient's level of consciousness and vital signs were monitored continuously by radiology nursing throughout the procedure under my direct supervision. FLUOROSCOPY: Radiation Exposure Index (as provided by the fluoroscopic device): 15 mGy Kerma CONTRAST:  100 mL Omnipaque 300 COMPLICATIONS: None immediate. PROCEDURE: Informed written consent was obtained from the patient after a thorough discussion of the procedural risks, benefits and alternatives. All questions were addressed. A timeout was performed prior to the initiation of the procedure. Right upper arm was prepped and draped in sterile fashion. Maximal barrier sterile technique was utilized including caps, mask, sterile gowns, sterile gloves, sterile drape, hand hygiene and skin antiseptic. Ultrasound demonstrated a patent brachiocephalic fistula. Cephalic vein image was saved for documentation. Skin was anesthetized near the elbow using 1% lidocaine. Using ultrasound guidance, 21 gauge needle was directed into the cephalic vein. Needle was directed into the cephalic vein where there was less calcium along the anterior aspect of the wall. Micropuncture dilator set was placed. Right upper extremity fistulogram images were obtained. Micropuncture catheter was exchanged for a 7 [place] vascular sheath over a Bentson wire. Bentson wire was advanced into the SVC using a Kumpe catheter. Right innominate vein, right subclavian vein and central cephalic vein were angioplastied using a 7 mm x 40 mm Conquest balloon. Follow-up fistulogram images were obtained. The right innominate vein and right subclavian  vein was subsequently treated with a 10 mm x 40 mm balloon and follow-up fistulogram images were obtained. 7 [place] vascular sheath was removed with a pursestring suture. Hemostasis at the puncture site at the end of the procedure. Bandage was placed at the suture site. Ultrasound was used to mark the skin in the mid humeral region where the partially thrombosed aneurysm is located. This area was marked "NS" for "No Stick". Recommend avoiding this area for dialysis cannulation due to the thrombus within this partially thrombosed aneurysm. FINDINGS: Right upper arm brachiocephalic fistula is patent. Arterial anastomosis is widely patent. The cephalic vein is heavily calcified and there is a large aneurysm formation in the mid humeral region of the cephalic vein. This aneurysm is partially thrombosed based on ultrasound. Suspect that this area is being cannulated during dialysis and the reason that clots were identified. Greater than 50% stenosis in the proximal/central cephalic vein. Stenosis in this area resolved following balloon angioplasty with a 7 mm balloon. Irregularity and narrowing involving the right subclavian vein and right innominate vein. Patient has a left jugular dialysis catheter that terminates in the right atrium. Stenosis in right innominate vein and right subclavian appear to be greater than 50%. Following balloon angioplasty with 10 mm balloon, there was markedly improved flow in the right innominate vein. The flow in the right subclavian vein also improved but there are residual densities in the right subclavian vein which may represent calcifications. Residual collateral flow in the right axilla and right lower neck at the end of the procedure. Overall, the flow in the central veins was improved at the end of the procedure. IMPRESSION: 1. Right upper extremity brachiocephalic fistula is patent. Areas of stenosis involving the proximal cephalic vein, right subclavian vein and right innominate  vein were successfully treated with balloon angioplasty. 2. 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 This aneurysm is partially thrombosed. Suspect this area is being cannulated during dialysis which is causing problems. This aneurysm area was marked  on the skin and recommend avoiding this area for dialysis cannulation. Recommend surgical consultation to evaluate this aneurysm formation. ACCESS: This access remains amenable to future percutaneous interventions as clinically indicated. Electronically Signed   By: Richarda Overlie M.D.   On: 05/14/2023 17:37   IR US Guide Vasc Access Right  Result Date: 05/14/2023 INDICATION: 42 year old with end-stage renal disease. Unable to cannulate right upper arm fistula due to clotting. Thrombus noted in the proximal to mid upper arm on vascular ultrasound. EXAM: 1. Right upper extremity fistulogram 2. Ultrasound guidance for vascular access 3. Balloon angioplasty of cephalic vein and central veins MEDICATIONS: Versed 0.5 mg ANESTHESIA/SEDATION: The patient's level of consciousness and vital signs were monitored continuously by radiology nursing throughout the procedure under my direct supervision. FLUOROSCOPY: Radiation Exposure Index (as provided by the fluoroscopic device): 15 mGy Kerma CONTRAST:  100 mL Omnipaque 300 COMPLICATIONS: None immediate. PROCEDURE: Informed written consent was obtained from the patient after a thorough discussion of the procedural risks, benefits and alternatives. All questions were addressed. A timeout was performed prior to the initiation of the procedure. Right upper arm was prepped and draped in sterile fashion. Maximal barrier sterile technique was utilized including caps, mask, sterile gowns, sterile gloves, sterile drape, hand hygiene and skin antiseptic. Ultrasound demonstrated a patent brachiocephalic fistula. Cephalic vein image was saved for documentation. Skin was anesthetized near the elbow using 1% lidocaine. Using  ultrasound guidance, 21 gauge needle was directed into the cephalic vein. Needle was directed into the cephalic vein where there was less calcium along the anterior aspect of the wall. Micropuncture dilator set was placed. Right upper extremity fistulogram images were obtained. Micropuncture catheter was exchanged for a 7 [place] vascular sheath over a Bentson wire. Bentson wire was advanced into the SVC using a Kumpe catheter. Right innominate vein, right subclavian vein and central cephalic vein were angioplastied using a 7 mm x 40 mm Conquest balloon. Follow-up fistulogram images were obtained. The right innominate vein and right subclavian vein was subsequently treated with a 10 mm x 40 mm balloon and follow-up fistulogram images were obtained. 7 [place] vascular sheath was removed with a pursestring suture. Hemostasis at the puncture site at the end of the procedure. Bandage was placed at the suture site. Ultrasound was used to mark the skin in the mid humeral region where the partially thrombosed aneurysm is located. This area was marked "NS" for "No Stick". Recommend avoiding this area for dialysis cannulation due to the thrombus within this partially thrombosed aneurysm. FINDINGS: Right upper arm brachiocephalic fistula is patent. Arterial anastomosis is widely patent. The cephalic vein is heavily calcified and there is a large aneurysm formation in the mid humeral region of the cephalic vein. This aneurysm is partially thrombosed based on ultrasound. Suspect that this area is being cannulated during dialysis and the reason that clots were identified. Greater than 50% stenosis in the proximal/central cephalic vein. Stenosis in this area resolved following balloon angioplasty with a 7 mm balloon. Irregularity and narrowing involving the right subclavian vein and right innominate vein. Patient has a left jugular dialysis catheter that terminates in the right atrium. Stenosis in right innominate vein and right  subclavian appear to be greater than 50%. Following balloon angioplasty with 10 mm balloon, there was markedly improved flow in the right innominate vein. The flow in the right subclavian vein also improved but there are residual densities in the right subclavian vein which may represent calcifications. Residual collateral flow in the right axilla  and right lower neck at the end of the procedure. Overall, the flow in the central veins was improved at the end of the procedure. IMPRESSION: 1. Right upper extremity brachiocephalic fistula is patent. Areas of stenosis involving the proximal cephalic vein, right subclavian vein and right innominate vein were successfully treated with balloon angioplasty. 2. 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 This aneurysm is partially thrombosed. Suspect this area is being cannulated during dialysis which is causing problems. This aneurysm area was marked on the skin and recommend avoiding this area for dialysis cannulation. Recommend surgical consultation to evaluate this aneurysm formation. ACCESS: This access remains amenable to future percutaneous interventions as clinically indicated. Electronically Signed   By: Richarda Overlie M.D.   On: 05/14/2023 17:37   VAS US DUPLEX DIALYSIS ACCESS (AVF, AVG)  Result Date: 05/13/2023 DIALYSIS ACCESS Patient Name:  KAYSON GANGULY  Date of Exam:   05/11/2023 Medical Rec #: [id number]      Accession #:    [id number] Date of Birth: 29-Nov-1980       Patient Gender: F Patient Age:   40 years Exam Location:  [facility] Procedure:      VAS US DUPLEX DIALYSIS ACCESS (AVF, AVG) Referring Phys: Crista Elliot --------------------------------------------------------------------------------  Reason for Exam: Unable to dialyze through AVF/AVG. Access Site: Right Upper Extremity. Access Type: Brachial-cephalic AVF. History: HX of RUE AVF revision 10/23/21 (aneurysm complication & ulcerated          skin). Limitations: Calcification  Comparison Study: Prior right fistula duplex done 03/17/23 Performing Technologist: Sherren Kerns RVS  Examination Guidelines: A complete evaluation includes B-mode imaging, spectral Doppler, color Doppler, and power Doppler as needed of all accessible portions of each vessel. Unilateral testing is considered an integral part of a complete examination. Limited examinations for reoccurring indications may be performed as noted.  +------------+----------+-------------+----------+--------+ OUTFLOW VEINPSV (cm/s)Diameter (cm)Depth (cm)Describe +------------+----------+-------------+----------+--------+ Clavicle       255                                    +------------+----------+-------------+----------+--------+ Shoulder        57                                    +------------+----------+-------------+----------+--------+ Prox UA         56                                    +------------+----------+-------------+----------+--------+ Mid UA          82                                    +------------+----------+-------------+----------+--------+ Dist UA        101                                    +------------+----------+-------------+----------+--------+ AC Fossa       123                                    +------------+----------+-------------+----------+--------+  Prox Forearm   172                                    +------------+----------+-------------+----------+--------+ Thrombus noted mid to proximal upper arm  Summary: Arteriovenous fistula-Velocities less than 100cm/s noted throughout the upper arm. Arteriovenous fistula-Thrombus noted mid to proximal upper arm. *See table(s) above for measurements and observations.  Diagnosing physician: Sherald Hess MD Electronically signed by Sherald Hess MD on 05/13/2023 at 1:36:52 PM.   --------------------------------------------------------------------------------   Final    IR Fluoro Guide CV Line  Left  Result Date: 05/10/2023 INDICATION: ESRD on HD.  Malfunctioning upper extremity AV fistula versus graft. EXAM: NON-TUNNELED CENTRAL VENOUS HEMODIALYSIS CATHETER PLACEMENT WITH ULTRASOUND AND FLUOROSCOPIC GUIDANCE COMPARISON:  Chest XR, 04/30/2023.  CTA head and neck, 09/22/2021. MEDICATIONS: Local anesthetic was administered.  10 mL lidocaine 1% FLUOROSCOPY TIME:  Fluoroscopic dose; 1 mGy COMPLICATIONS: None immediate. PROCEDURE: Informed written consent was obtained from the patient and/or patient's representative after a discussion of the risks, benefits, and alternatives to treatment. Questions regarding the procedure were encouraged and answered. The LEFT neck and chest were prepped with chlorhexidine in a sterile fashion, and a sterile drape was applied covering the operative field. Maximum barrier sterile technique with sterile gowns and gloves were used for the procedure. A timeout was performed prior to the initiation of the procedure. After the overlying soft tissues were anesthetized, a small venotomy incision was created and a micropuncture kit was utilized to access the internal jugular vein. Real-time ultrasound guidance was utilized for vascular access including the acquisition of a permanent ultrasound image documenting patency of the accessed vessel. The microwire was utilized to measure appropriate catheter length. A stiff glidewire was advanced to the level of the IVC. Under fluoroscopic guidance, the venotomy was serially dilated, ultimately allowing placement of a 20 cm temporary Trialysis catheter with tip ultimately terminating within the superior aspect of the right atrium. Final catheter positioning was confirmed and documented with a spot radiographic image. The catheter aspirates and flushes normally. The catheter was flushed with appropriate volume heparin dwells. The catheter exit site was secured with a 0-Prolene retention suture. A dressing was placed. The patient tolerated the  procedure well without immediate post procedural complication. IMPRESSION: Successful placement of a LEFT internal jugular approach 20 cm non tunneled/temporary dialysis catheter The tip of the catheter is positioned within the proximal RIGHT atrium. The catheter is ready for immediate use. PLAN: This catheter may be converted to a tunneled dialysis catheter at a later date as indicated. Roanna Banning, MD [facility] Electronically Signed   By: Roanna Banning M.D.   On: 05/10/2023 17:07   IR US Guide Vasc Access Left  Result Date: 05/10/2023 INDICATION: ESRD on HD.  Malfunctioning upper extremity AV fistula versus graft. EXAM: NON-TUNNELED CENTRAL VENOUS HEMODIALYSIS CATHETER PLACEMENT WITH ULTRASOUND AND FLUOROSCOPIC GUIDANCE COMPARISON:  Chest XR, 04/30/2023.  CTA head and neck, 09/22/2021. MEDICATIONS: Local anesthetic was administered.  10 mL lidocaine 1% FLUOROSCOPY TIME:  Fluoroscopic dose; 1 mGy COMPLICATIONS: None immediate. PROCEDURE: Informed written consent was obtained from the patient and/or patient's representative after a discussion of the risks, benefits, and alternatives to treatment. Questions regarding the procedure were encouraged and answered. The LEFT neck and chest were prepped with chlorhexidine in a sterile fashion, and a sterile drape was applied covering the operative field. Maximum barrier sterile technique with sterile gowns  and gloves were used for the procedure. A timeout was performed prior to the initiation of the procedure. After the overlying soft tissues were anesthetized, a small venotomy incision was created and a micropuncture kit was utilized to access the internal jugular vein. Real-time ultrasound guidance was utilized for vascular access including the acquisition of a permanent ultrasound image documenting patency of the accessed vessel. The microwire was utilized to measure appropriate catheter length. A stiff  glidewire was advanced to the level of the IVC. Under fluoroscopic guidance, the venotomy was serially dilated, ultimately allowing placement of a 20 cm temporary Trialysis catheter with tip ultimately terminating within the superior aspect of the right atrium. Final catheter positioning was confirmed and documented with a spot radiographic image. The catheter aspirates and flushes normally. The catheter was flushed with appropriate volume heparin dwells. The catheter exit site was secured with a 0-Prolene retention suture. A dressing was placed. The patient tolerated the procedure well without immediate post procedural complication. IMPRESSION: Successful placement of a LEFT internal jugular approach 20 cm non tunneled/temporary dialysis catheter The tip of the catheter is positioned within the proximal RIGHT atrium. The catheter is ready for immediate use. PLAN: This catheter may be converted to a tunneled dialysis catheter at a later date as indicated. Roanna Banning, MD [facility] Electronically Signed   By: Roanna Banning M.D.   On: 05/10/2023 17:07   CT ABDOMEN PELVIS W CONTRAST  Result Date: 05/09/2023 CLINICAL DATA:  Postop abdominal pain EXAM: CT ABDOMEN AND PELVIS WITH CONTRAST TECHNIQUE: Multidetector CT imaging of the abdomen and pelvis was performed using the standard protocol following bolus administration of intravenous contrast. RADIATION DOSE REDUCTION: This exam was performed according to the departmental dose-optimization program which includes automated exposure control, adjustment of the mA and/or kV according to patient size and/or use of iterative reconstruction technique. CONTRAST:  75mL OMNIPAQUE IOHEXOL 350 MG/ML SOLN COMPARISON:  CT 05/01/2023, 04/30/2023, 12/22/2022 FINDINGS: Lower chest: Lung bases demonstrate no acute airspace disease. Atelectasis at the left lung base. Hepatobiliary: Contracted gallbladder with small stones. No  biliary dilatation Pancreas: No acute inflammatory changes.  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 Adrenals/Urinary Tract: Adrenal glands are normal. Atrophic hypoenhancing kidneys without hydronephrosis. The bladder is unremarkable. Stomach/Bowel: The stomach contains mild debris. Interval ileocecectomy with right abdominal ileostomy. Residual small bowel thickening in the pelvis, series 3, image 65. Decompressed appearance of colon but with suspected diffuse colon wall thickening/potential colitis. No frank intramural air. Vascular/Lymphatic: Extensive aortic vascular disease and branch vessel calcification. No aneurysm. No suspicious lymph nodes. Reproductive: Heterogenous uterine enhancement which appears hypoenhancing on the right side and at the lower uterine segment and cervix. Slightly thick-walled appearance of vaginal cuff with small fluid and focus of air. No suspicious adnexal mass Other: Negative for pelvic effusion. Small gas collections at the right gutter likely postoperative. Ventral wound. Soft tissue thickening at the umbilicus. Small gas and fluid collection within the anterior abdomen extending inferiorly to the upper pelvis, but without strong rim enhancement to suggest organized abscess. Other small foci of intraperitoneal gas likely postoperative. Musculoskeletal: No acute osseous abnormality. IMPRESSION: 1. Interval ileocecectomy with right abdominal ileostomy. Residual small bowel thickening in the pelvis, suggesting residual enteritis of infectious, inflammatory or ischemic etiology. Decompressed appearance of colon but with suspected diffuse colon wall thickening/potential  colitis. 2. Small gas and fluid collection within the anterior abdomen extending inferiorly to the upper pelvis,  but without strong rim enhancement to suggest organized abscess. This could be postoperative in nature but attention on follow-up imaging. Small amounts of gas in the right colic gutter and peritoneal cavity also suspected to be secondary to postoperative gas. 3. Heterogenous uterine enhancement which appears hypoenhancing on the right side and at the lower uterine segment and cervix. Slightly thick-walled appearance of vaginal cuff with small fluid and focus of air. Correlate with pelvic exam. 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 5. Gallstones 6. Atrophic kidneys without hydronephrosis. 7. Aortic atherosclerosis. Aortic Atherosclerosis (ICD10-I70.0). Electronically Signed   By: Jasmine Pang M.D.   On: 05/09/2023 17:55   PERIPHERAL VASCULAR CATHETERIZATION  Result Date: 05/04/2023 See surgical note for result.  DG Abd 1 View  Result Date: 05/03/2023 CLINICAL DATA:  Abdomen pain EXAM: ABDOMEN - 1 VIEW COMPARISON:  CT 05/01/2023, radiograph 05/01/2023, CT 04/30/2023 FINDINGS: Slight increased small bowel distension compared to prior. Small bowel loops dilated up to 5.1 cm. Paucity of distal gas IMPRESSION: Slight increased small bowel distension compared to prior and consistent with bowel obstruction. Electronically Signed   By: Jasmine Pang M.D.   On: 05/03/2023 23:12   CT Angio Abd/Pel w/ and/or w/o  Result Date: 05/02/2023 CLINICAL DATA:  42 year old female with abdominal pain, diarrhea, low-grade fever, abnormal bowel on CT Abdomen and Pelvis 2 days ago. History of End stage renal disease, on dialysis, mesenteric artery stenosis. EXAM: CTA ABDOMEN AND PELVIS WITHOUT AND WITH CONTRAST TECHNIQUE: Multidetector CT imaging of the abdomen and pelvis was performed using the standard protocol during bolus administration of intravenous contrast. Multiplanar reconstructed images and MIPs were obtained and reviewed to evaluate the vascular anatomy. RADIATION DOSE REDUCTION: This exam was performed according  to the departmental dose-optimization program which includes automated exposure control, adjustment of the mA and/or kV according to patient size and/or use of iterative reconstruction technique. CONTRAST:  75mL OMNIPAQUE IOHEXOL 350 MG/ML SOLN COMPARISON:  CT Abdomen and Pelvis 04/30/2023. CTA abdomen and pelvis 12/22/2022. FINDINGS: VASCULAR Compared to CTA 12/22/2022. Extensive Aortoiliac calcified atherosclerosis. Extensive visceral calcified atherosclerosis. Celiac artery and SMA trunk remain patent. SMA atherosclerosis. Heavily calcified mesenteric vessels and multifocal mesenteric arterial stenosis such as on series 10, images 87, 84, and distally with a beaded appearance of the vessels that does not appear significantly changed since May. No proximal mesenteric arterial occlusion is identified. Similar renal artery and IMA atherosclerosis. Similar diffuse iliofemoral calcified atherosclerosis. No large artery occlusion is identified. Negative for abdominal aortic aneurysm or dissection. On the delayed imaging phase the portal venous system appears to be patent. Review of the MIP images confirms the above findings. NON-VASCULAR Lower chest: No pericardial effusion. Mild cardiomegaly. Combined curvilinear and dependent lung base atelectasis. No convincing pleural effusion. Hepatobiliary: Vicarious contrast excretion to the gallbladder superimposed on gallstones seen yesterday. Negative liver. Pancreas: Negative. Spleen: Negative. Adrenals/Urinary Tract: Partial renal atrophy redemonstrated. Negative adrenal glands. Diminutive and unremarkable bladder. Stomach/Bowel: Fluid-filled stomach and proximal duodenum. Distal duodenum is tapered, decompressed. Proximal small bowel loops are decompressed in the left abdomen. Dilated and fluid-filled small bowel persists in the mid and lower abdomen, not significantly changed from the CT yesterday. Mild rectal retained stool. Decompressed and thick walled appearing  sigmoid colon. Descending colon is mostly decompressed with mild fluid. Decompressed and indistinct transverse colon. Mostly decompressed right colon. Terminal ileum also appears to remain decompressed. Appendix caudal to the cecum  on series 14, image 68 is at the upper limits of normal but there is no convincing regional inflammation. Small bowel transition seems to occur in the distal ileum as yesterday. Abnormal appearance of the small bowel mesentery redemonstrated on coronal image 91, although correlating to the same area yesterday the air and fluid there seem to be intraluminal. The bowel wall detail is more limited on the arterial phase images today, but on the portal venous phase images this again appears to be intraluminal on series 7, image 55. No definite pneumoperitoneum. No portal venous gas identified. Lymphatic: No lymphadenopathy identified. Reproductive: Stable, within normal limits. Other: No pelvis free fluid. 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 No acute osseous abnormality identified. Similar mild generalized body wall edema. IMPRESSION: VASCULAR 1. Diffuse severe calcified atherosclerosis. Extensive chronic mesenteric arterial calcified plaque and multifocal mesenteric artery stenoses. But no proximal mesenteric or other large artery occlusion is identified. Aortic Atherosclerosis (ICD10-I70.0). 2. No portal venous thrombosis. NON-VASCULAR 1. Distal small bowel obstruction not significantly changed from yesterday. No perforation or abscess identified. The large bowel remains decompressed and also appears intermittently inflamed, especially the sigmoid colon. 2. Other chronic findings including partial renal atrophy, cholelithiasis, renal osteodystrophy. Electronically Signed   By: Odessa Fleming M.D.   On: 05/02/2023 07:47   DG Abd 1 View  Result Date: 05/01/2023 CLINICAL DATA:  Follow-up small bowel obstruction. EXAM: ABDOMEN  - 1 VIEW COMPARISON:  Abdomen and pelvis CT dated 04/30/2023. FINDINGS: Paucity of intestinal gas with no dilated bowel loops visualized. Diffuse arterial calcifications. Stable mild levoconvex lumbar scoliosis. IMPRESSION: Paucity of intestinal gas with no dilated bowel loops visualized. Electronically Signed   By: Beckie Salts M.D.   On: 05/01/2023 11:02   CT ABDOMEN PELVIS W CONTRAST  Result Date: 04/30/2023 CLINICAL DATA:  Generalized abdominal pain. EXAM: CT ABDOMEN AND PELVIS WITH CONTRAST TECHNIQUE: Multidetector CT imaging of the abdomen and pelvis was performed using the standard protocol following bolus administration of intravenous contrast. RADIATION DOSE REDUCTION: This exam was performed according to the departmental dose-optimization program which includes automated exposure control, adjustment of the mA and/or kV according to patient size and/or use of iterative reconstruction technique. CONTRAST:  75mL OMNIPAQUE IOHEXOL 350 MG/ML SOLN COMPARISON:  CTA abdomen and pelvis 12/22/2022, CT abdomen and pelvis no contrast 02/09/2022. FINDINGS: Lower chest: There is asymmetric with subpleural atelectasis in the left lower lobe possibly positional, with patient left posterior oblique. Lung bases are clear of infiltrates. The cardiac size is normal. There is three-vessel coronary artery calcification, small hiatal hernia. No pericardial effusion. Hepatobiliary: There is no hepatic mass enhancement. There are stones in the gallbladder without wall thickening. The common bile duct is prominent but no more than previously, measuring 11 mm without visible filling defect. There is only slight central intrahepatic biliary prominence. Pancreas: The gland is partially atrophic with mild prominence in the duct, but this was seen previously. There is no mass enhancement. Spleen: There is a linear hypodensity in the medial aspect of the spleen which was seen previously, preferably reflecting a chronic infarct. There  is no mass enhancement. Adrenals/Urinary Tract: Both kidneys show moderate atrophy. 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 There are extensive renovascular calcifications. No collecting system stones or hydronephrosis are seen. There is no adrenal or renal mass enhancement. The bladder is unremarkable for the degree of distention. Stomach/Bowel: There is increased diffuse thickening of  the gastric wall greater distally. Consider endoscopic follow-up. The duodenum and jejunum are unremarkable but there is dilatation of ileal small bowel in the mid to lower abdomen up to 4 cm. The exact transition to decompressed caliber and obstructive etiology are not identified but the most distal ileal segments are decompressed. Findings are compatible with small bowel obstruction. Evaluation of the colon demonstrates an upper limit of normal caliber retrocecal appendix without inflammatory change. This is unchanged. There is increased diffuse fold thickening in the ascending and transverse colon and increasingly also in the most distal descending and sigmoid segments consistent with colitis whether inflammatory, infectious or ischemic. There is no wall pneumatosis portal venous gas. Vascular/Lymphatic: There is extensive aortoiliac and visceral branch vessel calcific atherosclerosis, including the small visceral branch arteries to the mesentery, both kidneys, spleen and liver, especially unusual considering young age. There is no AAA. No enlarged lymph nodes are seen. Reproductive: There are extensive vascular calcifications in the uterus. The uterus and adnexal spaces are otherwise unremarkable. Other: No abdominal wall hernia or abnormality. Minimal pelvic ascites. No drainable pocket. No free air or free hemorrhage. No localizing collections. Musculoskeletal: No acute or significant osseous findings. IMPRESSION: 1. Evidence of small-bowel obstruction with  dilated ileal segments up to 4 cm. The exact transition to decompressed caliber in the obstructive etiology not identified but the most distal ileal segments are decompressed. 2. Increased diffuse fold thickening in the ascending and transverse colon and in the most distal descending and sigmoid segments consistent with colitis whether inflammatory, infectious or ischemic. No pneumatosis or portal venous gas. 3. Increased diffuse gastric wall thickening greater distally. Consider endoscopic follow-up. 4. Cholelithiasis with mild chronic prominence of the common bile duct but no visible filling defect. 5. Extensive vascular calcifications including the small visceral branch arteries to the mesentery, both kidneys, spleen and liver, markedly age-advanced. 6. Chronic linear hypodensity in the medial aspect of the spleen, probably a chronic infarct. 7. Small hiatal hernia. 8. 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 9. Minimal pelvic ascites. 10. Aortic and coronary artery atherosclerosis. Aortic Atherosclerosis (ICD10-I70.0). Electronically Signed   By: Almira Bar M.D.   On: 04/30/2023 07:11   DG Chest Port 1 View  Result Date: 04/30/2023 CLINICAL DATA:  Body aches and questionable sepsis EXAM: PORTABLE CHEST 1 VIEW COMPARISON:  12/22/2022 FINDINGS: Low volume chest with streaky density at the left base. There is no edema, air bronchogram, effusion, or pneumothorax. Stable heart size and mediastinal contours accounting for leftward rotation. IMPRESSION: Atelectasis or infiltrate at the left lung base.  Low lung volumes. Electronically Signed   By: Tiburcio Pea M.D.   On: 04/30/2023 04:49     PHYSICAL EXAM  Temp:  [97.2 F (36.2 C)-99 F (37.2 C)] 99 F (37.2 C) (10/16 1159) Pulse Rate:  [62-105] 79 (10/16 1144) Resp:  [10-20] 14 (10/16 1200) SpO2:  [85 %-100 %] 100 % (10/16 1200) Arterial Line BP: (77-196)/(32-73)  120/54 (10/16 1200) Weight:  [54.7 kg] 54.7 kg (10/16 0247)  General - thin built, malnourished, well developed, but agitated, intermittent screaming "leave me alone".  Ophthalmologic - fundi not visualized due to noncooperation.  Cardiovascular - Regular rhythm and rate.  Neuro - awake, alert, but b/l ptosis initially but later can open halfway on the left eye. With forced eyes opening, left pupil large irregular shape, right pupil 2.63mm, but pt stated that she can not see out of either  eyes. Not blinking to visual threat bilaterally. She is agitated and screaming "leave me alone", tole me in [place] and age 21 instead of 89, but not answering other orientation questions. Limited language output, following limited all simple commands due to noncooperation. No significant facial droop. Tongue protrusion not cooperative. Some spontaneous movement in extremitis but not cooperative on muscle strength testing. Sensation, coordination not cooperative and gait not tested.   ASSESSMENT/PLAN Ms. Vietta Pfieffer is a 42 y.o. 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  Developed altered mental status, confusion and right sided weakness and numbness.  No tPA given due to outside window.    Metabolic encephalopathy likely due to sepsis and septic shock CT right more than left parieto-occipital increased density, concerning for laminar necrosis MRI reported right frontal punctate infarct which I cannot appreciate.  Also reported right parietal occipital hyperintense signal fever acute to subacute infarct which on my review likely laminar necrosis.  Left parietal lobe cortical laminar necrosis.  Chronic infarct in the left cerebellum and left frontal lobe. CT head and neck Cortical calcification involving the right more than left posterior cerebral hemispheres, likely  from ischemia or PRES and accentuated by end-stage renal disease with calcium dysregulation. Atherosclerosis without flow reducing stenosis or ulceration of major arteries in the head and neck. 2D Echo EF 25 to 30% EEG diffuse severe encephalopathy, no seizure. LDL 16 HgbA1c 5.7 Heparin subcu for VTE prophylaxis No antithrombotic prior to admission, now on No antithrombotic.  Therapy recommendations: SNF Disposition: Pending  Sepsis and septic shock On vancomycin and Flagyl Hydrocortisone WBC 12.5--29.9 CCM managing  Cardiomyopathy EF 70 to 75% in 09/2021 On this admission EF 25 to 30% Likely related to current severe illness and ischemic changes over time Recommend cardiology consult   History of stroke 09/2021 admitted for embolic shower and multiple old infarcts.  CTA head and neck moderate right ICA siphon stenosis, EF 70 to 70%, TEE showed small PFO.  EEG negative.  DVT negative.  LDL 97, A1c 10.4.  Discharged on DAPT and Lipitor 40.  Recommend 30-day CardioNet monitoring but not done Also during 2-10/2021 admission patient found to have bilateral proptosis.  Workup unrevealing.  Concerning for [employer] syndrome, status post PLEX for 5 doses with minimal improvement.  Diabetes HgbA1c 5.7 goal < 7.0 Controlled Currently on insulin CBG monitoring SSI DM education and close PCP follow up  Hypotension with sepsis BP on the low end Needing multiple IV pressors and midodrine Management per CCM Long term BP goal normotensive  Hyperlipidemia Home meds: Lipitor 40 LDL 16, goal < 70 No statin at this time given extremely low LDL  Other Stroke Risk Factors ESRD on dialysis  Other Active Problems Anemia of chronic disease, hemoglobin 9.9--7.1--PRBC transfusion  Hospital day # 2  Neurology will sign off. Please call with questions. Pt will follow up with stroke clinic NP at [facility] in about 4 weeks. Thanks for the consult.   Marvel Plan, MD PhD Stroke  Neurology 05/29/2023 12:27 PM    To contact Stroke Continuity provider, please refer to [email or web address]. After hours, contact General Neurology

## 2023-05-29 NOTE — Progress Notes (Signed)
Pharmacy consulted for Aranesp dosing inpatient. Pt is on Mircera 75 q2weeks outpatient. Pt recently admitted - last inpatient Aranesp dose was given on Friday 10/11 from that admission (discharged on 10/11). Pt has not received ESA during this admission as of yet.     42 yo F with ESRD on iHD MWF. Last iHD was outpatient and completed on 10/14 for about 2 hours.  Next iHD planned for 10/16 inpatient.  Pt weight = 54.7 kg  Hgb 6.6 this AM - will receive 1u PRBC    Orders entered for Aranesp q 1wk (on Fridays) to begin on Friday 10/18.    Pharmacy will sign off    Calton Dach, PharmD, BCCCP Clinical Pharmacist 05/29/2023 9:09 AM

## 2023-05-29 NOTE — Progress Notes (Signed)
PT Cancellation Note  Patient Details Name: [name] MRN: [id number] DOB: 18-Aug-1980   Cancelled Treatment:    Reason Eval/Treat Not Completed: PT screened, no needs identified, will sign off. Pt from LTC at SNF and has been bedbound/hoyer dependent for over a year. No skilled PT needs. Recommend continued mechanical lift for all mobility.    [name] [facility] 05/29/2023, 11:11 AM Skip Mayer PT Acute [employer] [phone]

## 2023-05-29 NOTE — Progress Notes (Signed)
NAME:  Chystal Girty, MRN:  [id number], DOB:  [birth year], LOS: 2 ADMISSION DATE:  06/10/2023, CONSULTATION DATE:  05/29/2023 REFERRING MD: EDP, CHIEF COMPLAINT:  septic shock  History of Present Illness:  Lindora Macdermott is a 42 year old woman with insulin-dependent diabetes mellitus, resultant end-stage renal disease, bilateral AKA, blindness and severe peripheral arterial disease.  She presents hypotensive from dialysis today.  She had a recent prolonged hospital stay in September 2024 for small bowel ischemia with resultant small bowel obstruction.  She had an extended ileal cecotomy placed.  She was discharged 3 days ago to SNF.  Of note her AV fistula cannot be accessed due to pseudoaneurysm and repeated clotting.  She thus has a dialysis catheter placed in the right chest.  There is report that she was not adherent to dressing changes for her open abdominal wound, and did not agree to wound VAC.  She was found to be hypotensive in the ED and started on 2 vasopressors.  PCCM consulted for admission.  History is limited and obtained primarily from chart review and discussion with EDP.  Pertinent  Medical History  IDDM ESRD Bilateral AKA Blindness Severe PAD and recd  Significant Hospital Events: Including procedures, antibiotic start and stop dates in addition to other pertinent events   06/08/2023 admitted to the hospital with hypotension after dialysis concern for septic shock  Interim History / Subjective:  NAEON. Weaning vasopressors.  Lower SBP goal. Complains of pain.  Objective   Blood pressure (!) 58/44, pulse 95, temperature 97.9 F (36.6 C), temperature source Oral, resp. rate 16, weight 54.7 kg, SpO2 94%.        Intake/Output Summary (Last 24 hours) at 05/29/2023 0848 Last data filed at 05/29/2023 0600 Gross per 24 hour  Intake 1018.84 ml  Output 0 ml  Net 1018.84 ml   Filed Weights   05/28/23 0356 05/29/23 0247  Weight: 52.9 kg 54.7 kg    Examination: General:  chronically ill appearing, older than stated age HENT: dry mucus membranes Lungs: breathing non labored, lungs clear Cardiovascular: tachycardic, regular Abdomen: open abdominal wound ventral incision, cecotomy pouch in place, Extremities: bilateral AKA Neuro: awake, blind, not oriented to situation GU: cecotomy. In diaper  MRI Punctate acute infarct R frontal love, acute to subacute infarct right parieto occipital infarct  Resolved Hospital Problem list   Acute encephalopathy  Assessment & Plan:  Septic Shock secondary to suspected wound/Skin soft tissue infection -Continue Vancomycin cefepime, flagyl d/c'd 10/16   -SBP goal of 90, appears baseline during recent admission, wean pressors -Baseline BP look 80s-90s -Stress dose steroids started 10/15, resumed home midodrine 10/15 - Appreciate surgery assistance - Palliative care consult with severe co morbid chronic organ failure and refusing or fighting care for wound  End-stage renal disease, likely diabetic nephropathy -Has temporary HD catheter (trialysis) in place given AV fistula is currently not functional -Appreciate nephrology assistance  Type 2 diabetes Severe peripheral or vascular disease Recent small bowel ischemia Bilateral AKA -Semglee, sliding scale insulin  Punctate acute infarct R frontal love, acute to subacute infarct right parieto occipital infarct: --appreciate neurology assistance, stroke team to follow  Best Practice (right click and "Reselect all SmartList Selections" daily)   Diet/type: NPO DVT prophylaxis: prophylactic heparin  GI prophylaxis: N/A Lines: Dialysis Catheter Foley:  N/A Code Status:  full code Last date of multidisciplinary goals of care discussion [facility] care consulted 10/16]  Labs   CBC: Recent Labs  Lab 05/24/23 0549 05/25/23 1159 06/06/2023 1510 05/16/2023 1511 05/29/2023  1514 05/28/23 0403 05/29/23 0505 05/29/23 0733  WBC 10.5 12.6* 12.5*  --   --  27.9*  --  16.6*   NEUTROABS 7.2 10.3* 10.6*  --   --   --   --   --   HGB 9.4* 8.5* 7.5* 9.9* 9.9* 7.1* 7.8* 6.6*  HCT 31.1* 30.2* 25.3* 29.0* 29.0* 23.7* 23.0* 21.4*  MCV 89.1 91.5 88.8  --   --  88.4  --  86.6  PLT 334 349 316  --   --  416*  --  308    Basic Metabolic Panel: Recent Labs  Lab 05/24/23 0549 05/25/23 1159 05/22/2023 1510 06/05/2023 1511 06/03/2023 1514 05/28/23 0403 05/29/23 0505 05/29/23 0733  NA 133* 134* 137 136 137 129* 128* 128*  K 4.2 4.5 4.0 3.9 3.9 3.1* 5.4* 5.2*  CL 94* 91* 94*  --  95* 94*  --  97*  CO2 25 20* 24  --   --  19*  --  18*  GLUCOSE 175* 282* 111*  --  113* 308*  --  130*  BUN 20 18 19   --  20 21*  --  28*  CREATININE 4.22* 3.98* 3.48*  --  3.70* 3.78*  --  4.15*  CALCIUM 10.8* 11.7* 10.0  --   --  10.1  --  10.8*  MG  --   --  1.7  --   --  1.5*  --   --   PHOS 5.1*  --   --   --   --  4.0  --  5.8*   GFR: Estimated Creatinine Clearance: 13.5 mL/min (A) (by C-G formula based on SCr of 4.15 mg/dL (H)). Recent Labs  Lab 05/25/23 1159 05/25/23 1204 05/25/23 1510 06/12/2023 1510 05/23/2023 1513 05/28/23 0403 05/28/23 0920 05/29/23 0733  WBC 12.6*  --   --  12.5*  --  27.9*  --  16.6*  LATICACIDVEN  --    < > 1.2  --  2.4* 3.5* 2.2*  --    < > = values in this interval not displayed.    Liver Function Tests: Recent Labs  Lab 05/24/23 0549 05/25/23 1159 06/10/2023 1510 05/29/23 0733  AST  --  14* 59*  --   ALT  --  13 25  --   ALKPHOS  --  101 124  --   BILITOT  --  1.0 1.0  --   PROT  --  8.4* 7.5  --   ALBUMIN 2.4* 2.8* 2.4* 1.9*   Recent Labs  Lab 05/25/23 1159  LIPASE 55*   No results for input(s): "AMMONIA" in the last 168 hours.  ABG    Component Value Date/Time   PHART 7.383 05/29/2023 0505   PCO2ART 31.4 (L) 05/29/2023 0505   PO2ART 256 (H) 05/29/2023 0505   HCO3 18.6 (L) 05/29/2023 0505   TCO2 20 (L) 05/29/2023 0505   ACIDBASEDEF 6.0 (H) 05/29/2023 0505   O2SAT 100 05/29/2023 0505     Coagulation Profile: No results for  input(s): "INR", "PROTIME" in the last 168 hours.  Cardiac Enzymes: No results for input(s): "CKTOTAL", "CKMB", "CKMBINDEX", "TROPONINI" in the last 168 hours.  HbA1C: Hgb A1c MFr Bld  Date/Time Value Ref Range Status  04/30/2023 09:30 AM 5.7 (H) 4.8 - 5.6 % Final    Comment:    (NOTE) Pre diabetes:          5.7%-6.4%  Diabetes:              >  6.4%  Glycemic control for   <7.0% adults with diabetes   12/20/2022 01:40 PM 8.2 (H) 4.8 - 5.6 % Final    Comment:    (NOTE) Pre diabetes:          5.7%-6.4%  Diabetes:              >6.4%  Glycemic control for   <7.0% adults with diabetes     CBG: Recent Labs  Lab 05/28/23 2035 05/28/23 2159 05/28/23 2318 05/29/23 0321 05/29/23 0744  GLUCAP 117* 102* 88 121* 115*    Review of Systems:   +abdominal pain  Past Medical History:  She,  has a past medical history of Anemia, Anxiety, Blind right eye (2008), Diabetes mellitus without complication (HCC), Embolic stroke (HCC), ESRD (end stage renal disease) (HCC), ESRD on hemodialysis (HCC), and GBS (Guillain Barre syndrome) (HCC).   Surgical History:   Past Surgical History:  Procedure Laterality Date   AMPUTATION Right 08/11/2021   Procedure: TRANSMETATARSAL AMPUTATION OF RIGHT FOOT;  Surgeon: Nada Libman, MD;  Location: [facility] OR;  Service: Vascular;  Laterality: Right;   AMPUTATION Right 09/17/2021   Procedure: AMPUTATION RIGHT BELOW KNEE;  Surgeon: Nada Libman, MD;  Location: [facility] OR;  Service: Vascular;  Laterality: Right;   AMPUTATION Right 10/11/2021   Procedure: RIGHT ABOVE KNEE AMPUTATION;  Surgeon: Nada Libman, MD;  Location: [facility] OR;  Service: Vascular;  Laterality: Right;   AMPUTATION Left 07/02/2022   Procedure: LEFT ABOVE KNEE AMPUTATION;  Surgeon: Chuck Hint, MD;  Location: [facility] OR;  Service: Vascular;  Laterality: Left;   AMPUTATION TOE Left    APPLICATION OF WOUND VAC  08/16/2021   Procedure: APPLICATION OF WOUND VAC;  Surgeon: Nada Libman,  MD;  Location: [facility] OR;  Service: Vascular;;   AV FISTULA PLACEMENT     BUBBLE STUDY  10/02/2021   Procedure: BUBBLE STUDY;  Surgeon: Pricilla Riffle, MD;  Location: [facility] ENDOSCOPY;  Service: Cardiovascular;;   CESAREAN SECTION  2011   COLOSTOMY Right 05/04/2023   Procedure: COLOSTOMY;  Surgeon: Moise Boring, MD;  Location[facility] OR;  Service: General;  Laterality: Right;   FISTULA SUPERFICIALIZATION Right 07/27/2019   Procedure: 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;  Surgeon: Chuck Hint, MD;  Location: [facility] OR;  Service: Vascular;  Laterality: Right;   INSERTION OF DIALYSIS CATHETER Left 07/27/2019   Procedure: INSERTION OF TUNNELED  DIALYSIS CATHETER;  Surgeon: Chuck Hint, MD;  Location: [facility] OR;  Service: Vascular;  Laterality: Left;   INSERTION OF DIALYSIS CATHETER Left 10/23/2021   Procedure: INSERTION OF TUNNELED PALINDROME PRECISION DIALYSIS CATHETER (23cm);  Surgeon: Cephus Shelling, MD;  Location: [facility] OR;  Service: Vascular;  Laterality: Left;   IR AV DIALY SHUNT INTRO NEEDLE/INTRACATH INITIAL W/PTA/IMG RIGHT Right 05/14/2023   IR FLUORO GUIDE CV LINE LEFT  10/16/2021   IR FLUORO GUIDE CV LINE LEFT  05/10/2023   IR FLUORO GUIDE CV LINE RIGHT  10/11/2021   IR GASTROSTOMY TUBE MOD SED  10/30/2021   IR GASTROSTOMY TUBE REMOVAL  05/08/2022   IR US GUIDE VASC ACCESS LEFT  10/16/2021   IR US GUIDE VASC ACCESS LEFT  05/10/2023   IR US GUIDE VASC ACCESS RIGHT  10/11/2021   IR US GUIDE VASC ACCESS RIGHT  05/14/2023   LAPAROSCOPY N/A 05/04/2023   Procedure: LAPAROSCOPY DIAGNOSTIC;  Surgeon: Moise Boring, MD;  Location: [facility] OR;  Service: General;  Laterality: N/A;  LAPAROTOMY N/A 05/04/2023   Procedure: EXPLORATION LAPAROTOMY WITH ILEO-CAECAL RESECTION;  Surgeon: Moise Boring, MD;  Location: [facility] OR;  Service: General;  Laterality: N/A;   LEFT HEART CATH AND CORONARY ANGIOGRAPHY N/A 07/26/2022   Procedure: LEFT HEART CATH AND CORONARY ANGIOGRAPHY;  Surgeon:  [place], Peter M, MD;  Location: [facility] INVASIVE CV LAB;  Service: Cardiovascular;  Laterality: N/A;   LOWER EXTREMITY ANGIOGRAPHY N/A 08/11/2021   Procedure: LOWER EXTREMITY ANGIOGRAPHY;  Surgeon: Nada Libman, MD;  Location: [facility] INVASIVE CV LAB;  Service: Cardiovascular;  Laterality: N/A;   REVISON OF ARTERIOVENOUS FISTULA Right 10/23/2021   Procedure: REVISON OF ARTERIOVENOUS FISTULA  ARM AND PLICATION;  Surgeon: Cephus Shelling, MD;  Location: [facility] OR;  Service: Vascular;  Laterality: Right;   TEE WITHOUT CARDIOVERSION N/A 10/02/2021   Procedure: TRANSESOPHAGEAL ECHOCARDIOGRAM (TEE);  Surgeon: Pricilla Riffle, MD;  Location: [facility] ENDOSCOPY;  Service: Cardiovascular;  Laterality: N/A;   TRANSMETATARSAL AMPUTATION Right 08/16/2021   Procedure: CLOSURE OF RIGHT TRANSMETATARSAL AMPUTATION;  Surgeon: Nada Libman, MD;  Location: [facility] OR;  Service: Vascular;  Laterality: Right;     Social History:   reports that she has never smoked. She has never used smokeless tobacco. She reports that she does not drink alcohol and does not use drugs.   Family History:  Her family history includes Diabetes in her father and mother.   Allergies Allergies  Allergen Reactions   Morphine Rash and Other (See Comments)    Per patient MAR.   Peanut-Containing Drug Products Anaphylaxis and Hives   Penicillins Rash and Other (See Comments)    Rash in 2008.  Tolerated cefazolin in 2020. No reaction listed on MAR.   Shellfish Allergy Swelling   Chocolate Hives     Home Medications  Prior to Admission medications   Medication Sig Start Date End Date Taking? Authorizing Provider  Accu-Chek FastClix Lancets MISC Use as instructed to check blood sugar up to TID. E11.22 Patient taking differently: 1 each by Other route See admin instructions. Use as instructed to check blood sugar up to TID. E11.22 04/15/19   Hoy Register, MD  acetaminophen (TYLENOL) 325 MG tablet Take 650 mg by mouth every 6 (six) hours as needed for  fever or mild pain.    [provider]  atorvastatin (LIPITOR) 40 MG tablet Place 1 tablet (40 mg total) into feeding tube daily. Patient taking differently: Take 40 mg by mouth daily. 10/26/21   Burnadette Pop, MD  azelastine (OPTIVAR) 0.05 % ophthalmic solution Place 1 drop into both eyes 2 (two) times daily as needed (allergies). 11/13/22   [provider]  cetirizine (ZYRTEC) 10 MG tablet Take 10 mg by mouth daily.    [provider]  Continuous Blood Gluc Transmit (DEXCOM G6 TRANSMITTER) MISC 1 Device by Does not apply route as directed. 09/12/20   Shamleffer, Konrad Dolores, MD  Dorzolamide HCl-Timolol Mal PF 2-0.5 % SOLN Place 1 drop into the left eye in the morning and at bedtime.    [provider]  dorzolamide-timolol (COSOPT) 22.3-6.8 MG/ML ophthalmic solution Place 1 drop into the left eye 2 (two) times daily. 03/30/21   [provider]  dronabinol (MARINOL) 5 MG capsule Take 1 capsule (5 mg total) by mouth 2 (two) times daily before lunch and supper. 05/24/23   Rhetta Mura, MD  glucagon, human recombinant, (GLUCAGEN DIAGNOSTIC) 1 MG injection Inject 1 mg into the vein once as needed for low blood sugar.    [provider]  glucose blood (ACCU-CHEK GUIDE) test strip Use as instructed to check blood sugar up to TID. E11.22 Patient taking differently: 1 each by Other route See admin instructions. Use as instructed to check blood sugar up to TID. E11.22 07/17/19   Fulp, Cammie, MD  HYDROcodone-acetaminophen (NORCO/VICODIN) 5-325 MG tablet Take 1 tablet by mouth every 6 (six) hours as needed for moderate pain. 05/24/23   Rhetta Mura, MD  insulin aspart (FIASP FLEXTOUCH) 100 UNIT/ML FlexTouch Pen Inject 1-10 Units into the skin 4 (four) times daily -  before meals and at bedtime. Inject as per sliding scale: If 121-150= 1 units call MD if blood sugar <70 or 400>; 151-200= 2 units 201-250= 3 units 251-300= 5 units 301-350= 7  units 351-400= 10 units Subcutaneously before meals and at bedtime.    [provider]  insulin glargine (SEMGLEE) 100 UNIT/ML injection Inject 0.1 mLs (10 Units total) into the skin daily at 6 PM. Patient taking differently: Inject 10 Units into the skin daily. 12/28/22   Lorin Glass, MD  lanthanum (FOSRENOL) 1000 MG chewable tablet Chew 1 tablet (1,000 mg total) by mouth 3 (three) times daily with meals. 05/24/23   Rhetta Mura, MD  loperamide (IMODIUM) 2 MG capsule Take 1 capsule (2 mg total) by mouth every 6 (six) hours as needed for diarrhea or loose stools. 05/24/23   Rhetta Mura, MD  midodrine (PROAMATINE) 10 MG tablet Take 1 tablet (10 mg total) by mouth 3 (three) times daily with meals. 05/24/23   Rhetta Mura, MD  Multiple Vitamin (MULTIVITAMIN WITH MINERALS) TABS tablet Take 1 tablet by mouth daily.    [provider]  Nutritional Supplements (NUTRITIONAL DRINK) LIQD Take 120 mLs by mouth daily.    [provider]  ondansetron (ZOFRAN) 4 MG tablet Take 4 mg by mouth every 4 (four) hours as needed for nausea/vomiting. 03/02/22   [provider]  polycarbophil (FIBERCON) 625 MG tablet Take 2 tablets (1,250 mg total) by mouth 2 (two) times daily. 05/24/23   Rhetta Mura, MD  thiamine 100 MG tablet Place 1 tablet (100 mg total) into feeding tube daily. Patient taking differently: Take 100 mg by mouth daily. 10/26/21   Burnadette Pop, MD     Critical care time:     CRITICAL CARE Performed by: Karren Burly   Total critical care time: 31 minutes  Critical care time was exclusive of separately billable procedures and treating other patients.  Critical care was necessary to treat or prevent imminent or life-threatening deterioration.  Critical care was time spent personally by me on the following activities: development of treatment plan with patient and/or surrogate as well as nursing, discussions with  consultants, evaluation of patient's response to treatment, examination of patient, obtaining history from patient or surrogate, ordering and performing treatments and interventions, ordering and review of laboratory studies, ordering and review of radiographic studies, pulse oximetry and re-evaluation of patient's condition.   Lesia Sago Blaine Guiffre [place] [facility] 05/29/2023 8:48 AM  Pager: see AMION  If no response to pager , please call critical care on call (see AMION) until 7pm After 7:00 pm call Elink

## 2023-05-29 NOTE — Progress Notes (Signed)
[facility] Progress Note   Subjective:   remains in ICU - just yells "leave me alone" when interacting; on low dose vasopressor support afebrile on Universal, net + 4L for admission  Objective Vitals:   05/29/23 0645 05/29/23 0700 05/29/23 0743 05/29/23 0800  BP:      Pulse:    95  Resp: 14 17  16   Temp:   97.9 F (36.6 C)   TempSrc:   Oral   SpO2:    94%  Weight:       Physical Exam Gen: chronically ill and uncomfortable appearing  Eyes: blind ENT: MMM Neck: L internal jugular temp trialysis catheter in place CV: RRR Abd:  L mid ostomy stump pink, abd wound dressing noted Lungs: clear Extr: bl aka, no wounds or edema, RUE AVF thrill and bruit Neuro: yelling and writhing, can answer questions  Additional Objective Labs: Basic Metabolic Panel: Recent Labs  Lab 05/24/23 0549 05/25/23 1159 05/27/23 1510 05/27/23 1511 05/27/23 1514 05/28/23 0403 05/29/23 0505 05/29/23 0733  NA 133*   < > 137   < > 137 129* 128* 128*  K 4.2   < > 4.0   < > 3.9 3.1* 5.4* 5.2*  CL 94*   < > 94*  --  95* 94*  --  97*  CO2 25   < > 24  --   --  19*  --  18*  GLUCOSE 175*   < > 111*  --  113* 308*  --  130*  BUN 20   < > 19  --  20 21*  --  28*  CREATININE 4.22*   < > 3.48*  --  3.70* 3.78*  --  4.15*  CALCIUM 10.8*   < > 10.0  --   --  10.1  --  10.8*  PHOS 5.1*  --   --   --   --  4.0  --  5.8*   < > = values in this interval not displayed.   Liver Function Tests: Recent Labs  Lab 05/25/23 1159 05/27/23 1510 05/29/23 0733  AST 14* 59*  --   ALT 13 25  --   ALKPHOS 101 124  --   BILITOT 1.0 1.0  --   PROT 8.4* 7.5  --   ALBUMIN 2.8* 2.4* 1.9*   Recent Labs  Lab 05/25/23 1159  LIPASE 55*   CBC: Recent Labs  Lab 05/24/23 0549 05/25/23 1159 05/27/23 1510 05/27/23 1511 05/28/23 0403 05/29/23 0505 05/29/23 0733  WBC 10.5 12.6* 12.5*  --  27.9*  --  16.6*  NEUTROABS 7.2 10.3* 10.6*  --   --   --   --   HGB 9.4* 8.5* 7.5*   < > 7.1* 7.8* 6.6*  HCT 31.1*  30.2* 25.3*   < > 23.7* 23.0* 21.4*  MCV 89.1 91.5 88.8  --  88.4  --  86.6  PLT 334 349 316  --  416*  --  308   < > = values in this interval not displayed.   Blood Culture    Component Value Date/Time   SDES BLOOD LEFT ARM 05/27/2023 1527   SPECREQUEST  05/27/2023 1527    BOTTLES DRAWN AEROBIC AND ANAEROBIC Blood Culture adequate volume   CULT  05/27/2023 1527    NO GROWTH 2 DAYS Performed at [facility], [address] [place], [place] [address]    REPTSTATUS PENDING 05/27/2023 1527    Cardiac Enzymes: No  results for input(s): "CKTOTAL", "CKMB", "CKMBINDEX", "TROPONINI" in the last 168 hours. CBG: Recent Labs  Lab 05/28/23 2035 05/28/23 2159 05/28/23 2318 05/29/23 0321 05/29/23 0744  GLUCAP 117* 102* 88 121* 115*   Iron Studies: No results for input(s): "IRON", "TIBC", "TRANSFERRIN", "FERRITIN" in the last 72 hours. @lablastinr3 @ Studies/Results: CT ANGIO HEAD NECK W WO CM  Result Date: 05/29/2023 CLINICAL DATA:  Stroke, determine embolic source. EXAM: CT ANGIOGRAPHY HEAD AND NECK WITH AND WITHOUT CONTRAST TECHNIQUE: Multidetector CT imaging of the head and neck was performed using the standard protocol during bolus administration of intravenous contrast. Multiplanar CT image reconstructions and MIPs were obtained to evaluate the vascular anatomy. Carotid stenosis measurements (when applicable) are obtained utilizing NASCET criteria, using the distal internal carotid diameter as the denominator. RADIATION DOSE REDUCTION: This exam was performed according to the departmental dose-optimization program which includes automated exposure control, adjustment of the mA and/or kV according to patient size and/or use of iterative reconstruction technique. 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 Chronic small vessel ischemic gliosis in the cerebral white matter with chronic perforator infarct in the left frontal white matter better seen on prior MRI. No evidence of acute hemorrhage, hydrocephalus, or mass. Generalized brain atrophy, premature for age. Vascular: No hyperdense vessel or unexpected calcification. Skull: Sclerotic skull attributed to the end-stage renal disease. Sinuses/Orbits: Chronic left mastoid opacification with under pneumatization and sclerosis. Review of the MIP images confirms the above findings CTA NECK FINDINGS Aortic arch: Unremarkable Right carotid system: Mild atheromatous plaque at the bifurcation. No stenosis or ulceration. Left carotid system: Mild atheromatous plaque at the bifurcation. No stenosis or ulceration. Vertebral arteries: No proximal subclavian stenosis. Scattered calcified plaque without flow reducing stenosis, beading, or ulceration. Skeleton: Negative Other neck: Scattered intravenous gas attributed to IV access. Small right globe with signs of chronic sub retinal collection. Upper chest: Small branching micro nodules in the apical lungs. Review of the MIP images confirms the above findings CTA HEAD FINDINGS Anterior circulation: Atheromatous calcification of the cavernous carotids to the degree that lumen visualization is difficult. No branch occlusion, beading, aneurysm, or proximal flow reducing stenosis. Posterior circulation: Vertebral and basilar arteries are smoothly contoured and widely patent. Calcified plaque is seen along the bilateral V4 segments. No branch occlusion, beading, aneurysm, or proximal flow reducing stenosis. Venous sinuses: Unremarkable Anatomic variants: None significant Review of the MIP images confirms the above findings IMPRESSION: Head CT: 1. Cortical calcification involving the right more than left posterior  cerebral hemispheres, likely from ischemia or PRES and accentuated by end-stage renal disease with calcium dysregulation. 2. No acute hemorrhage. CTA: 1. No emergent vascular finding. 2. Atherosclerosis without flow reducing stenosis or ulceration of major arteries in the head and neck. Limited assessment of the cavernous carotids due to the degree of calcified plaque obscuring the lumen. 3. Micro nodules in the apical lungs suggesting bronchiolitis, correlate for acute infectious symptoms. Electronically Signed   By: Tiburcio Pea M.D.   On: 05/29/2023 06:49   MR BRAIN WO CONTRAST  Result Date: 05/28/2023 CLINICAL DATA:  Stroke suspected (Ped 0-17y) EXAM: MRI HEAD WITHOUT CONTRAST TECHNIQUE: Multiplanar, multiecho pulse sequences of the brain and surrounding structures were obtained without intravenous contrast. COMPARISON:  Same day CT head FINDINGS: Limitations: Sagittal T1 weighted sequences were not acquired. Brain: There is a punctate acute infarct in the right frontal lobe (series 2, image 42). Cortical hyperintense signal on diffusion-weighted imaging in the right parieto-occipital region is favored to represent regions of acute to subacute infarct. The hyperdense appearance seen on same day head CT likely represents infarct related enhancement. There are chronic infarcts in the left cerebellum in the left frontal lobe. Findings in the left parietal lobe likely represent changes related to cortical laminar necrosis. No acute hemorrhage. No hydrocephalus. No extra-axial fluid collection. No mass effect. No mass lesion. There are additional small punctate microhemorrhages predominantly in a central location, possibly secondary to hypertensive microangiopathy. Vascular: Normal flow voids. Skull and upper cervical spine: Normal marrow signal. Sinuses/Orbits: No mastoid or middle ear effusion. Mild mucosal thickening of the floor of the right maxillary sinus. Bilateral lens replacement. There are chronic  changes involving the right globe with likely chronic layering vitreal blood products on the right. Other: None. IMPRESSION: 1. Punctate acute infarct in the right frontal lobe. 2. Cortical hyperintense signal on diffusion-weighted imaging in the right parieto-occipital region is favored to represent regions of acute to subacute infarct. The hyperdense appearance seen on same day head CT likely represents infarct related enhancement. Further evaluation with contrast-enhanced brain MRI is recommended. 3. 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 4. 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 These results will be called to the ordering clinician or representative by the Radiologist Assistant, and communication documented in the PACS or [employer]. Electronically Signed   By: Lorenza Cambridge M.D.   On: 05/28/2023 15:16   CT CHEST ABDOMEN PELVIS W CONTRAST  Result Date: 05/27/2023 CLINICAL DATA:  Sepsis. Altered mental status. Open abdominal wound. EXAM: CT CHEST, ABDOMEN, AND PELVIS WITH CONTRAST TECHNIQUE: Multidetector CT imaging of the chest, abdomen and pelvis was performed following the standard protocol during bolus administration of intravenous contrast. RADIATION DOSE REDUCTION: This exam was performed according to the departmental dose-optimization program which includes automated exposure control, adjustment of the mA and/or kV according to patient size and/or use of iterative reconstruction technique. CONTRAST:  60mL OMNIPAQUE IOHEXOL 350 MG/ML SOLN COMPARISON:  CT abdomen pelvis dated 05/25/2023. FINDINGS: CT CHEST FINDINGS Cardiovascular: There is no cardiomegaly or pericardial effusion. Advanced 3 vessel coronary vascular calcification. Left IJ central venous line with tip at the cavoatrial junction. There is mild atherosclerotic calcification of the thoracic aorta. No aneurysmal dilatation or dissection. The origins of the great vessels of the aortic  arch and the central pulmonary arteries appear patent as visualized. Mediastinum/Nodes: No hilar or mediastinal adenopathy. The esophagus is grossly unremarkable. No mediastinal fluid collection. Lungs/Pleura: Clusters of nodular densities with tree-in-bud appearance bilaterally most consistent with an infectious process, possibly atypical in etiology or aspiration. Clinical correlation is recommended. No consolidative changes. There is no pleural effusion or pneumothorax. The central airways are patent. Musculoskeletal: There is diffuse subcutaneous edema. No fluid collection. No acute osseous pathology. 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 CT ABDOMEN PELVIS FINDINGS No intra-abdominal free air or free fluid. Hepatobiliary: The liver is unremarkable. Mild biliary dilatation. High attenuating content within the gallbladder as seen previously and may represent vicarious excretion of contrast versus gallstones. No pericholecystic fluid. Pancreas: The pancreas is atrophic for age. There is mild dilatation of the main pancreatic duct. No active inflammatory changes. Spleen: Normal in size without focal abnormality.  Adrenals/Urinary Tract: The adrenal glands are unremarkable. Extensive renal vascular calcification. There is moderate bilateral renal parenchyma atrophy. There is no hydronephrosis on either side. The urinary bladder is collapsed. There is diffuse thickened appearance of the bladder wall with speckled calcification, likely sequela chronic infection/inflammation. Correlation with urinalysis recommended to evaluate for active cystitis. Stomach/Bowel: Diffusely thickened colon may be related to underdistention or represent pancolitis. Clinical correlation recommended. 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 No bowel obstruction. Vascular/Lymphatic: Advanced aortoiliac atherosclerotic disease. There is extensive calcification of  the mesenteric arteries. The IVC is unremarkable. No portal venous gas. There is no adenopathy. Reproductive: The uterus is anteverted. Diffuse calcification of uterine vasculature. No adnexal masses. Other: Midline vertical anterior pelvic wall open surgical wound. No fluid collection. There is diffuse subcutaneous edema. Musculoskeletal: No acute osseous pathology. IMPRESSION: 1. Clusters of nodular densities with tree-in-bud appearance bilaterally most consistent with an infectious process, possibly atypical in etiology or aspiration. 2. Diffusely thickened colon may be related to underdistention or represent pancolitis. 3. 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 No bowel obstruction. 4. Diffuse thickened appearance of the bladder wall. Correlation with urinalysis recommended to evaluate for active cystitis. 5.  Aortic Atherosclerosis (ICD10-I70.0). Electronically Signed   By: Elgie Collard M.D.   On: 05/27/2023 19:23   DG Chest Portable 1 View  Result Date: 05/27/2023 CLINICAL DATA:  Shortness of breath. EXAM: PORTABLE CHEST 1 VIEW COMPARISON:  Most recent radiograph 05/18/2023 FINDINGS: Left-sided dialysis catheter tip in the right atrium. Heart size upper normal. Stable mediastinal contours. Coronary stent visualized. No focal airspace disease, pleural effusion, pulmonary edema or pneumothorax. IMPRESSION: No acute findings or explanation for shortness of breath. Electronically Signed   By: Narda Rutherford M.D.   On: 05/27/2023 17:26   CT Head Wo Contrast  Result Date: 05/27/2023 CLINICAL DATA:  Headache, neuro deficit AMS unequal pupils EXAM: CT HEAD WITHOUT CONTRAST TECHNIQUE: Contiguous axial images were obtained from the base of the skull through the vertex without intravenous contrast. RADIATION DOSE REDUCTION: This exam was performed according to the departmental dose-optimization program which includes automated exposure control, adjustment of the mA and/or  kV according to patient size and/or use of iterative reconstruction technique. COMPARISON:  CT head November 23, 2021. FINDINGS: Brain: Motion limited study. Multifocal cortical hyperdensity within the right greater than left parieto-occipital regions. No significant mass effect. No mass lesion. Patchy white matter hypodensities likely represent chronic microvascular ischemic disease. 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 Vascular: No hyperdense vessel. Skull: No acute fracture. Sinuses/Orbits: Mostly clear sinuses.  No acute orbital findings. Other: No mastoid effusions. IMPRESSION: Multifocal likely cortical hyperdensity involving the right greater than left parieto-occipital regions, suspicious for cortical laminar necrosis due to subacute infarct and/or small volume of acute/recent hemorrhage. No significant mass effect. Recommend MRI brain to further evaluate. Findings discussed with Dr. Ledon Snare via telephone at 5:03 p.m. Electronically Signed   By: Feliberto Harts M.D.   On: 05/27/2023 17:04   Medications:  ceFEPime (MAXIPIME) IV Stopped (05/28/23 1616)   norepinephrine (LEVOPHED) Adult infusion 8 mcg/min (05/29/23 0600)   vancomycin     vasopressin 0.03 Units/min (05/29/23 0600)     stroke: early stages of recovery book   Does not apply Once   sodium chloride   Intravenous Once   atorvastatin  20 mg Oral Daily   Chlorhexidine Gluconate Cloth  6 each Topical Daily   heparin  5,000 Units Subcutaneous Q8H  hydrocortisone sod succinate (SOLU-CORTEF) inj  100 mg Intravenous Q12H   insulin aspart  0-15 Units Subcutaneous Q4H   LORazepam  1 mg Intravenous Once   midodrine  10 mg Oral TID WC   sodium hypochlorite   Irrigation BID    Outpt HD: [facility] MWF  three times per week 180 NR, 400/1.5 auto, 2K/2Ca, EDW 49kg, mircera 75 q2wks   Assessment/Plan **Shock: presumed septic, on broad spectrum abx and vasopressor support per PCCM. Also on midodrine. Cultures pending.    **ESRD on HD: rec'd 2hr  treatment Mon.  iHD today while on low dose vasopressor support - I think she will tolerate but if not would need to start CRRT.  She's net + for the admission with ^ O2 requirement --> set goal for 3L with prn albumin and if ok with PCCM use inc pressors.    **Anemia:  Hb 7s, transfuse per primary.  consulted pharm to dose ESA = she was just here but I don't have a date for last dose.  On mircera 75q2wks outpt.    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  Looks ok.    **Secondary hyperPTH:  Ca high this AM, follow after HD. Senispar on hold d/t GI issues. Not on VDRA. Last Phos 4, changed binders from sevelamer d/t SBO to Fosrenol.    **DM: per primary   **acute/subacute CVA: MRI confirmed poss infarcts, neuro to consult -  rec MRI w contrast but not advisable due to ESRD status. CTA has been obtained.   Will follow - page/message with concerns.   Estill Bakes MD 05/29/2023, 8:47 AM  [facility] Pager: [phone]

## 2023-05-29 NOTE — Progress Notes (Signed)
Progress Note     Subjective: Patient stating she is in pain despite pain meds. Discussed with CCM and recommend re-engagement of palliative care given acute stroke.   Objective: Vital signs in last 24 hours: Temp:  [97.6 F (36.4 C)-98.5 F (36.9 C)] 97.9 F (36.6 C) (10/16 0743) Pulse Rate:  [62-105] 95 (10/16 0800) Resp:  [10-20] 16 (10/16 0800) SpO2:  [85 %-100 %] 94 % (10/16 0800) Arterial Line BP: (77-196)/(32-73) 151/58 (10/16 0800) Weight:  [54.7 kg] 54.7 kg (10/16 0247) Last BM Date :  (UTA)  Intake/Output from previous day: 10/15 0701 - 10/16 0700 In: 1139.5 [I.V.:672.5; IV Piggyback:467.1] Out: 0  Intake/Output this shift: No intake/output data recorded.  PE: General: chronically ill appearing  Lungs: Normal effort on [place] Abd: [place] present, midline with significant necrotic tissue present, no purulent drainage    Lab Results:  Recent Labs    05/28/23 0403 05/29/23 0505 05/29/23 0733  WBC 27.9*  --  16.6*  HGB 7.1* 7.8* 6.6*  HCT 23.7* 23.0* 21.4*  PLT 416*  --  308   BMET Recent Labs    05/28/23 0403 05/29/23 0505 05/29/23 0733  NA 129* 128* 128*  K 3.1* 5.4* 5.2*  CL 94*  --  97*  CO2 19*  --  18*  GLUCOSE 308*  --  130*  BUN 21*  --  28*  CREATININE 3.78*  --  4.15*  CALCIUM 10.1  --  10.8*   PT/INR No results for input(s): "LABPROT", "INR" in the last 72 hours. CMP     Component Value Date/Time   NA 128 (L) 05/29/2023 0733   NA 133 (L) 05/18/2020 0927   K 5.2 (H) 05/29/2023 0733   CL 97 (L) 05/29/2023 0733   CO2 18 (L) 05/29/2023 0733   GLUCOSE 130 (H) 05/29/2023 0733   BUN 28 (H) 05/29/2023 0733   BUN 45 (H) 05/18/2020 0927   CREATININE 4.15 (H) 05/29/2023 0733   CALCIUM 10.8 (H) 05/29/2023 0733   CALCIUM 12.1 (H) 11/13/2021 2026   PROT 7.5 05/27/2023 1510   PROT 7.6 05/18/2020 0927   ALBUMIN 1.9 (L) 05/29/2023 0733   ALBUMIN 3.9 05/18/2020 0927   AST 59 (H) 05/27/2023 1510   ALT 25 05/27/2023 1510   ALKPHOS 124  05/27/2023 1510   BILITOT 1.0 05/27/2023 1510   BILITOT <0.2 05/18/2020 0927   GFRNONAA 13 (L) 05/29/2023 0733   GFRAA 6 (L) 05/18/2020 0927   Lipase     Component Value Date/Time   LIPASE 55 (H) 05/25/2023 1159       Studies/Results: CT ANGIO HEAD NECK W WO CM  Result Date: 05/29/2023 CLINICAL DATA:  Stroke, determine embolic source. EXAM: CT ANGIOGRAPHY HEAD AND NECK WITH AND WITHOUT CONTRAST TECHNIQUE: Multidetector CT imaging of the head and neck was performed using the standard protocol during bolus administration of intravenous contrast. Multiplanar CT image reconstructions and MIPs were obtained to evaluate the vascular anatomy. Carotid stenosis measurements (when applicable) are obtained utilizing NASCET criteria, using the distal internal carotid diameter as the denominator. RADIATION DOSE REDUCTION: This exam was performed according to the departmental dose-optimization program which includes automated exposure control, adjustment of the mA and/or kV according to patient size and/or use of iterative reconstruction technique. 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 Chronic small vessel ischemic gliosis in the cerebral white matter with chronic perforator infarct in the left frontal white matter better seen on prior MRI. No evidence of acute hemorrhage, hydrocephalus, or mass. Generalized brain atrophy, premature for age. Vascular: No hyperdense vessel or unexpected calcification. Skull: Sclerotic skull attributed to the end-stage renal disease. Sinuses/Orbits: Chronic left mastoid opacification with under pneumatization and sclerosis. Review of the MIP images confirms the above findings CTA  NECK FINDINGS Aortic arch: Unremarkable Right carotid system: Mild atheromatous plaque at the bifurcation. No stenosis or ulceration. Left carotid system: Mild atheromatous plaque at the bifurcation. No stenosis or ulceration. Vertebral arteries: No proximal subclavian stenosis. Scattered calcified plaque without flow reducing stenosis, beading, or ulceration. Skeleton: Negative Other neck: Scattered intravenous gas attributed to IV access. Small right globe with signs of chronic sub retinal collection. Upper chest: Small branching micro nodules in the apical lungs. Review of the MIP images confirms the above findings CTA HEAD FINDINGS Anterior circulation: Atheromatous calcification of the cavernous carotids to the degree that lumen visualization is difficult. No branch occlusion, beading, aneurysm, or proximal flow reducing stenosis. Posterior circulation: Vertebral and basilar arteries are smoothly contoured and widely patent. Calcified plaque is seen along the bilateral V4 segments. No branch occlusion, beading, aneurysm, or proximal flow reducing stenosis. Venous sinuses: Unremarkable Anatomic variants: None significant Review of the MIP images confirms the above findings IMPRESSION: Head CT: 1. Cortical calcification involving the right more than left posterior cerebral hemispheres, likely from ischemia or PRES and accentuated by end-stage renal disease with calcium dysregulation. 2. No acute hemorrhage. CTA: 1. No emergent vascular finding. 2. Atherosclerosis without flow reducing stenosis or ulceration of major arteries in the head and neck. Limited assessment of the cavernous carotids due to the degree of calcified plaque obscuring the lumen. 3. Micro nodules in the apical lungs suggesting bronchiolitis, correlate for acute infectious symptoms. Electronically Signed   By: Tiburcio Pea M.D.   On: 05/29/2023 06:49   MR BRAIN WO CONTRAST  Result Date: 05/28/2023 CLINICAL DATA:  Stroke suspected (Ped  0-17y) EXAM: MRI HEAD WITHOUT CONTRAST TECHNIQUE: Multiplanar, multiecho pulse sequences of the brain and surrounding structures were obtained without intravenous contrast. COMPARISON:  Same day CT head FINDINGS: Limitations: Sagittal T1 weighted sequences were not acquired. Brain: There is a punctate acute infarct in the right frontal lobe (series 2, image 42). Cortical hyperintense signal on diffusion-weighted imaging in the right parieto-occipital region is favored to represent regions of acute to subacute infarct. The hyperdense appearance seen on same day head CT likely represents infarct related enhancement. There are chronic infarcts in the left cerebellum in the left frontal lobe. Findings in the left parietal lobe likely represent changes related to cortical laminar necrosis. No acute hemorrhage. No hydrocephalus. No extra-axial fluid collection. No mass effect. No mass lesion. There are additional small punctate microhemorrhages predominantly in a central location, possibly secondary to hypertensive microangiopathy. Vascular: Normal flow voids. Skull and upper cervical spine: Normal marrow signal. Sinuses/Orbits: No mastoid or middle ear effusion. Mild mucosal thickening of the floor of the right maxillary sinus. Bilateral lens replacement. There are chronic changes involving the right globe with likely chronic layering vitreal blood products on the right. Other: None. IMPRESSION: 1. Punctate acute infarct in the right frontal lobe. 2. Cortical hyperintense signal on diffusion-weighted imaging in the right parieto-occipital region is favored to represent regions of acute to subacute infarct.  The hyperdense appearance seen on same day head CT likely represents infarct related enhancement. Further evaluation with contrast-enhanced brain MRI is recommended. 3. 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 4. 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 These results will  be called to the ordering clinician or representative by the Radiologist Assistant, and communication documented in the PACS or [employer]. Electronically Signed   By: Lorenza Cambridge M.D.   On: 05/28/2023 15:16   CT CHEST ABDOMEN PELVIS W CONTRAST  Result Date: 05/27/2023 CLINICAL DATA:  Sepsis. Altered mental status. Open abdominal wound. EXAM: CT CHEST, ABDOMEN, AND PELVIS WITH CONTRAST TECHNIQUE: Multidetector CT imaging of the chest, abdomen and pelvis was performed following the standard protocol during bolus administration of intravenous contrast. RADIATION DOSE REDUCTION: This exam was performed according to the departmental dose-optimization program which includes automated exposure control, adjustment of the mA and/or kV according to patient size and/or use of iterative reconstruction technique. CONTRAST:  60mL OMNIPAQUE IOHEXOL 350 MG/ML SOLN COMPARISON:  CT abdomen pelvis dated 05/25/2023. FINDINGS: CT CHEST FINDINGS Cardiovascular: There is no cardiomegaly or pericardial effusion. Advanced 3 vessel coronary vascular calcification. Left IJ central venous line with tip at the cavoatrial junction. There is mild atherosclerotic calcification of the thoracic aorta. No aneurysmal dilatation or dissection. The origins of the great vessels of the aortic arch and the central pulmonary arteries appear patent as visualized. Mediastinum/Nodes: No hilar or mediastinal adenopathy. The esophagus is grossly unremarkable. No mediastinal fluid collection. Lungs/Pleura: Clusters of nodular densities with tree-in-bud appearance bilaterally most consistent with an infectious process, possibly atypical in etiology or aspiration. Clinical correlation is recommended. No consolidative changes. There is no pleural effusion or pneumothorax. The central airways are patent. Musculoskeletal: There is diffuse subcutaneous edema. No fluid collection. No acute osseous pathology. Dilated visualized right upper extremity pain,  likely related to AV fistula. CT ABDOMEN PELVIS FINDINGS No intra-abdominal free air or free fluid. Hepatobiliary: The liver is unremarkable. Mild biliary dilatation. High attenuating content within the gallbladder as seen previously and may represent vicarious excretion of contrast versus gallstones. No pericholecystic fluid. Pancreas: The pancreas is atrophic for age. There is mild dilatation of the main pancreatic duct. No active inflammatory changes. Spleen: Normal in size without focal abnormality. Adrenals/Urinary Tract: The adrenal glands are unremarkable. Extensive renal vascular calcification. There is moderate bilateral renal parenchyma atrophy. There is no hydronephrosis on either side. The urinary bladder is collapsed. There is diffuse thickened appearance of the bladder wall with speckled calcification, likely sequela chronic infection/inflammation. Correlation with urinalysis recommended to evaluate for active cystitis. Stomach/Bowel: Diffusely thickened colon may be related to underdistention or represent pancolitis. Clinical correlation recommended. 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 No bowel obstruction. Vascular/Lymphatic: Advanced aortoiliac atherosclerotic disease. There is extensive calcification of the mesenteric arteries. The IVC is unremarkable. No portal venous gas. There is no adenopathy. Reproductive: The uterus is anteverted. Diffuse calcification of uterine vasculature. No adnexal masses. Other: Midline vertical anterior pelvic wall open surgical wound. No fluid collection. There is diffuse subcutaneous edema. Musculoskeletal: No acute osseous pathology. IMPRESSION: 1. Clusters of nodular densities with tree-in-bud appearance bilaterally most consistent with an infectious process, possibly atypical in etiology or aspiration. 2. Diffusely thickened colon may be related to underdistention or represent pancolitis. 3. Postsurgical changes of the  bowel with a right lower quadrant ileostomy. No bowel obstruction. 4. Diffuse thickened appearance of the bladder wall. Correlation with urinalysis  recommended to evaluate for active cystitis. 5.  Aortic Atherosclerosis (ICD10-I70.0). Electronically Signed   By: Elgie Collard M.D.   On: 05/27/2023 19:23   DG Chest Portable 1 View  Result Date: 05/27/2023 CLINICAL DATA:  Shortness of breath. EXAM: PORTABLE CHEST 1 VIEW COMPARISON:  Most recent radiograph 05/18/2023 FINDINGS: Left-sided dialysis catheter tip in the right atrium. Heart size upper normal. Stable mediastinal contours. Coronary stent visualized. No focal airspace disease, pleural effusion, pulmonary edema or pneumothorax. IMPRESSION: No acute findings or explanation for shortness of breath. Electronically Signed   By: Narda Rutherford M.D.   On: 05/27/2023 17:26   CT Head Wo Contrast  Result Date: 05/27/2023 CLINICAL DATA:  Headache, neuro deficit AMS unequal pupils EXAM: CT HEAD WITHOUT CONTRAST TECHNIQUE: Contiguous axial images were obtained from the base of the skull through the vertex without intravenous contrast. RADIATION DOSE REDUCTION: This exam was performed according to the departmental dose-optimization program which includes automated exposure control, adjustment of the mA and/or kV according to patient size and/or use of iterative reconstruction technique. COMPARISON:  CT head November 23, 2021. FINDINGS: Brain: Motion limited study. Multifocal cortical hyperdensity within the right greater than left parieto-occipital regions. No significant mass effect. No mass lesion. Patchy white matter hypodensities likely represent chronic microvascular ischemic disease. 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 Vascular: No hyperdense vessel. Skull: No acute fracture. Sinuses/Orbits: Mostly clear sinuses.  No acute orbital findings. Other: No mastoid effusions. IMPRESSION: Multifocal likely cortical hyperdensity involving the right greater than  left parieto-occipital regions, suspicious for cortical laminar necrosis due to subacute infarct and/or small volume of acute/recent hemorrhage. No significant mass effect. Recommend MRI brain to further evaluate. Findings discussed with Dr. Ledon Snare via telephone at 5:03 p.m. Electronically Signed   By: Feliberto Harts M.D.   On: 05/27/2023 17:04    Anti-infectives: Anti-infectives (From admission, onward)    Start     Dose/Rate Route Frequency Ordered Stop   05/29/23 1200  vancomycin (VANCOREADY) IVPB 500 mg/100 mL        500 mg 100 mL/hr over 60 Minutes Intravenous Every M-W-F (Hemodialysis) 05/28/23 0924     05/28/23 1500  ceFEPIme (MAXIPIME) 2 g in sodium chloride 0.9 % 100 mL IVPB  Status:  Discontinued        2 g 200 mL/hr over 30 Minutes Intravenous Every 24 hours 05/28/23 0921 05/28/23 0922   05/28/23 1500  ceFEPIme (MAXIPIME) 1 g in sodium chloride 0.9 % 100 mL IVPB        1 g 200 mL/hr over 30 Minutes Intravenous Every 24 hours 05/28/23 0922     05/28/23 0600  metroNIDAZOLE (FLAGYL) IVPB 500 mg  Status:  Discontinued        500 mg 100 mL/hr over 60 Minutes Intravenous Every 12 hours 05/28/23 0104 05/29/23 0847   05/27/23 1600  vancomycin (VANCOCIN) IVPB 1000 mg/200 mL premix        1,000 mg 200 mL/hr over 60 Minutes Intravenous  Once 05/27/23 1508 05/27/23 1933   05/27/23 1515  ceFEPIme (MAXIPIME) 1 g in sodium chloride 0.9 % 100 mL IVPB        1 g 200 mL/hr over 30 Minutes Intravenous  Once 05/27/23 1508 05/27/23 1558   05/27/23 1515  metroNIDAZOLE (FLAGYL) IVPB 500 mg        500 mg 100 mL/hr over 60 Minutes Intravenous  Once 05/27/23 1508 05/27/23 1933        Assessment/Plan Small bowel ischemia with SBO  POD#25  s/p diagnostic lap converted to ex-lap with extended ileo-cecectomy (mid-ileum to just past cecum), 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

## 2023-05-29 NOTE — Progress Notes (Signed)
eLink Physician-Brief Progress Note Patient Name: [name] DOB: [birth year] MRN: [id number]   Date of Service  05/29/2023  HPI/Events of Note  RN unable to get SAT reading on pt all shift, despite changing site mult times.  Suggesting ABG 136/51 (73), HR 83 NSR, RR 14 on 2L [place]  eICU Interventions  ABG ordered     Intervention Category Intermediate Interventions: Other:  Darl Pikes 05/29/2023, 4:28 AM

## 2023-05-30 DIAGNOSIS — Z515 Encounter for palliative care: Secondary | ICD-10-CM | POA: Diagnosis not present

## 2023-05-30 DIAGNOSIS — A419 Sepsis, unspecified organism: Secondary | ICD-10-CM | POA: Diagnosis not present

## 2023-05-30 DIAGNOSIS — R6521 Severe sepsis with septic shock: Secondary | ICD-10-CM | POA: Diagnosis not present

## 2023-05-30 LAB — CBC
HCT: 27 % — ABNORMAL LOW (ref 36.0–46.0)
Hemoglobin: 9.1 g/dL — ABNORMAL LOW (ref 12.0–15.0)
MCH: 29.6 pg (ref 26.0–34.0)
MCHC: 33.7 g/dL (ref 30.0–36.0)
MCV: 87.9 fL (ref 80.0–100.0)
Platelets: 231 10*3/uL (ref 150–400)
RBC: 3.07 MIL/uL — ABNORMAL LOW (ref 3.87–5.11)
RDW: 20.2 % — ABNORMAL HIGH (ref 11.5–15.5)
WBC: 16.1 10*3/uL — ABNORMAL HIGH (ref 4.0–10.5)
nRBC: 2.3 % — ABNORMAL HIGH (ref 0.0–0.2)

## 2023-05-30 LAB — GLUCOSE, CAPILLARY
Glucose-Capillary: 47 mg/dL — ABNORMAL LOW (ref 70–99)
Glucose-Capillary: 104 mg/dL — ABNORMAL HIGH (ref 70–99)
Glucose-Capillary: 121 mg/dL — ABNORMAL HIGH (ref 70–99)
Glucose-Capillary: 76 mg/dL (ref 70–99)
Glucose-Capillary: 190 mg/dL — ABNORMAL HIGH (ref 70–99)
Glucose-Capillary: 196 mg/dL — ABNORMAL HIGH (ref 70–99)
Glucose-Capillary: 176 mg/dL — ABNORMAL HIGH (ref 70–99)

## 2023-05-30 LAB — CULTURE, BLOOD (ROUTINE X 2)
Culture: NO GROWTH
Culture: NO GROWTH

## 2023-05-30 LAB — TYPE AND SCREEN
ABO/RH(D): O POS
Antibody Screen: NEGATIVE
Unit division: 0

## 2023-05-30 LAB — RENAL FUNCTION PANEL
Albumin: 2 g/dL — ABNORMAL LOW (ref 3.5–5.0)
Anion gap: 14 (ref 5–15)
BUN: 14 mg/dL (ref 6–20)
CO2: 22 mmol/L (ref 22–32)
Calcium: 9.9 mg/dL (ref 8.9–10.3)
Chloride: 93 mmol/L — ABNORMAL LOW (ref 98–111)
Creatinine, Ser: 2.76 mg/dL — ABNORMAL HIGH (ref 0.44–1.00)
GFR, Estimated: 21 mL/min — ABNORMAL LOW (ref >60)
Glucose, Bld: 151 mg/dL — ABNORMAL HIGH (ref 70–99)
Phosphorus: 3.9 mg/dL (ref 2.5–4.6)
Potassium: 3.9 mmol/L (ref 3.5–5.1)
Sodium: 129 mmol/L — ABNORMAL LOW (ref 135–145)

## 2023-05-30 LAB — BPAM RBC
Blood Product Expiration Date: 202411052359
ISSUE DATE / TIME: 202410161137
Unit Type and Rh: 5100

## 2023-05-30 LAB — HEPATITIS B SURFACE ANTIBODY, QUANTITATIVE: Hep B S AB Quant (Post): 19007 m[IU]/mL

## 2023-05-30 MED ORDER — INSULIN ASPART 100 UNIT/ML IJ SOLN
0.0000 [IU] | Freq: Three times a day (TID) | INTRAMUSCULAR | Status: DC
  Administered 2023-05-30 – 2023-05-31 (×2): 2 [IU] via SUBCUTANEOUS
  Administered 2023-05-31: 3 [IU] via SUBCUTANEOUS
  Administered 2023-05-31 – 2023-06-01 (×2): 2 [IU] via SUBCUTANEOUS
  Administered 2023-06-04 (×2): 1 [IU] via SUBCUTANEOUS
  Administered 2023-06-05: 2 [IU] via SUBCUTANEOUS

## 2023-05-30 MED ORDER — DEXTROSE 50 % IV SOLN
INTRAVENOUS | Status: AC
  Filled 2023-05-30: qty 50

## 2023-05-30 MED ORDER — DEXTROSE 50 % IV SOLN
25.0000 g | INTRAVENOUS | Status: AC
  Administered 2023-05-30: 25 g via INTRAVENOUS

## 2023-05-30 MED ORDER — OXYCODONE HCL 5 MG PO TABS
10.0000 mg | ORAL_TABLET | ORAL | Status: DC | PRN
  Administered 2023-05-31 – 2023-06-01 (×4): 10 mg via ORAL
  Filled 2023-05-30 (×4): qty 2

## 2023-05-30 MED ORDER — DRONABINOL 2.5 MG PO CAPS
5.0000 mg | ORAL_CAPSULE | Freq: Two times a day (BID) | ORAL | Status: DC
  Administered 2023-05-30 – 2023-06-17 (×37): 5 mg via ORAL
  Filled 2023-05-30 (×38): qty 2

## 2023-05-30 NOTE — Progress Notes (Signed)
[facility] Progress Note   Subjective:   remains in ICU - off pressors now. Tolerated HD with 2.6L UF yesterday via AVF.    Objective Vitals:   05/30/23 0430 05/30/23 0500 05/30/23 0530 05/30/23 0757  BP: 114/77 114/73 115/75   Pulse:      Resp: 12 (!) 9 (!) 9   Temp:    97.8 F (36.6 C)  TempSrc:    Oral  SpO2:      Weight:  53.6 kg     Physical Exam Gen: chronically ill and uncomfortable appearing  Eyes: blind ENT: MMM Neck: L internal jugular temp trialysis catheter in place CV: RRR Abd:  R mid ostomy stump pink, abd wound dressing noted Lungs: clear Extr: bl aka, no wounds or edema, RUE AVF thrill and bruit Neuro: yelling and writhing, can answer questions   Additional Objective Labs: Basic Metabolic Panel: Recent Labs  Lab 05/28/23 0403 05/29/23 0505 05/29/23 0733 05/30/23 0426  NA 129* 128* 128* 129*  K 3.1* 5.4* 5.2* 3.9  CL 94*  --  97* 93*  CO2 19*  --  18* 22  GLUCOSE 308*  --  130* 151*  BUN 21*  --  28* 14  CREATININE 3.78*  --  4.15* 2.76*  CALCIUM 10.1  --  10.8* 9.9  PHOS 4.0  --  5.8* 3.9   Liver Function Tests: Recent Labs  Lab 05/25/23 1159 05/22/2023 1510 05/29/23 0733 05/30/23 0426  AST 14* 59*  --   --   ALT 13 25  --   --   ALKPHOS 101 124  --   --   BILITOT 1.0 1.0  --   --   PROT 8.4* 7.5  --   --   ALBUMIN 2.8* 2.4* 1.9* 2.0*   Recent Labs  Lab 05/25/23 1159  LIPASE 55*   CBC: Recent Labs  Lab 05/24/23 0549 05/25/23 1159 06/06/2023 1510 05/18/2023 1511 05/28/23 0403 05/29/23 0505 05/29/23 0733 05/29/23 1610 05/30/23 0426  WBC 10.5 12.6* 12.5*  --  27.9*  --  16.6*  --  16.1*  NEUTROABS 7.2 10.3* 10.6*  --   --   --   --   --   --   HGB 9.4* 8.5* 7.5*   < > 7.1*   < > 6.6* 8.9* 9.1*  HCT 31.1* 30.2* 25.3*   < > 23.7*   < > 21.4* 27.2* 27.0*  MCV 89.1 91.5 88.8  --  88.4  --  86.6  --  87.9  PLT 334 349 316  --  416*  --  308  --  231   < > = values in this interval not displayed.   Blood  Culture    Component Value Date/Time   SDES BLOOD LEFT ARM 05/23/2023 1527   SPECREQUEST  05/20/2023 1527    BOTTLES DRAWN AEROBIC AND ANAEROBIC Blood Culture adequate volume   CULT  05/29/2023 1527    NO GROWTH 3 DAYS Performed at [facility], [address] [place], [place] [address]    REPTSTATUS PENDING 05/25/2023 1527    Cardiac Enzymes: No results for input(s): "CKTOTAL", "CKMB", "CKMBINDEX", "TROPONINI" in the last 168 hours. CBG: Recent Labs  Lab 05/29/23 1915 05/29/23 2315 05/30/23 0320 05/30/23 0430 05/30/23 0754  GLUCAP 122* 85 47* 104* 121*   Iron Studies: No results for input(s): "IRON", "TIBC", "TRANSFERRIN", "FERRITIN" in the last 72 hours. @lablastinr3 @ Studies/Results: ECHOCARDIOGRAM COMPLETE  Result Date: 05/29/2023  ECHOCARDIOGRAM REPORT   Patient Name:   [name] Date of Exam: 05/29/2023 Medical Rec #:  [id number]     Height:       61.0 in Accession #:    [id number]    Weight:       120.6 lb Date of Birth:  03/18/1981      BSA:          1.523 m Patient Age:    42 years      BP:           123/55 mmHg Patient Gender: F             HR:           70 bpm. Exam Location:  Inpatient Procedure: 2D Echo, Cardiac Doppler and Color Doppler Indications:    Stroke I63.9  History:        Patient has prior history of Echocardiogram examinations, most                 recent 07/24/2022. CAD, PAD and Stroke; Risk                 Factors:Hypertension, Diabetes, Dyslipidemia and Non-Smoker.  Sonographer:    Dondra Prader RVT RCS Referring Phys: [id number] ASHISH ARORA  Sonographer Comments: Technically challenging due to wound, patient position and lack of cooperation. Patient appeared to be agitated. IMPRESSIONS  1. Left ventricular ejection fraction, by estimation, is 25 to 30%. The left ventricle has severely decreased function. The left ventricle demonstrates regional wall motion abnormalities (see scoring diagram/findings for description). There is moderate asymmetric left  ventricular hypertrophy of the septal segment. Indeterminate diastolic filling due to E-A fusion.  2. Right ventricular systolic function is moderately reduced. The right ventricular size is mildly enlarged.  3. Catheter tip noted in the RA.  4. The mitral valve is degenerative. Mild to moderate mitral valve regurgitation. No evidence of mitral stenosis.  5. Tricuspid valve regurgitation is moderate to severe.  6. Focal calcification of the NCC noted. The aortic valve is tricuspid. Aortic valve regurgitation is not visualized. Aortic valve sclerosis is present, with no evidence of aortic valve stenosis. Comparison(s): Changes from prior study are noted. The left ventricular function is significantly worse. LVEF now severely reduced with WMA. RV dysfunction now present. FINDINGS  Left Ventricle: Left ventricular ejection fraction, by estimation, is 25 to 30%. The left ventricle has severely decreased function. The left ventricle demonstrates regional wall motion abnormalities. The left ventricular internal cavity size was normal  in size. There is moderate asymmetric left ventricular hypertrophy of the septal segment. Indeterminate diastolic filling due to E-A fusion.  LV Wall Scoring: The apical lateral segment and mid anterolateral segment are akinetic. Right Ventricle: The right ventricular size is mildly enlarged. No increase in right ventricular wall thickness. Right ventricular systolic function is moderately reduced. Left Atrium: Left atrial size was normal in size. Right Atrium: Catheter tip noted in the RA. Right atrial size was normal in size. Pericardium: Trivial pericardial effusion is present. Mitral Valve: The mitral valve is degenerative in appearance. Mild to moderate mitral annular calcification. Mild to moderate mitral valve regurgitation. No evidence of mitral valve stenosis. Tricuspid Valve: The tricuspid valve is grossly normal. Tricuspid valve regurgitation is moderate to severe. No evidence of  tricuspid stenosis. Aortic Valve: Focal calcification of the NCC noted. The aortic valve is tricuspid. Aortic valve regurgitation is not visualized. Aortic valve sclerosis is present, with no evidence of aortic valve stenosis. Aortic  valve mean gradient measures 2.0 mmHg. Aortic valve peak gradient measures 3.6 mmHg. Aortic valve area, by VTI measures 1.87 cm. Pulmonic Valve: The pulmonic valve was grossly normal. Pulmonic valve regurgitation is mild. No evidence of pulmonic stenosis. Aorta: The aortic root and ascending aorta are structurally normal, with no evidence of dilitation. Venous: The inferior vena cava was not well visualized. IAS/Shunts: The atrial septum is grossly normal.  LEFT VENTRICLE PLAX 2D LVIDd:         3.80 cm   Diastology LVIDs:         3.00 cm   LV e' medial:    4.40 cm/s LV PW:         1.10 cm   LV E/e' medial:  22.6 LV IVS:        1.50 cm   LV e' lateral:   11.00 cm/s LVOT diam:     1.80 cm   LV E/e' lateral: 9.0 LV SV:         27 LV SV Index:   18 LVOT Area:     2.54 cm  RIGHT VENTRICLE RV S prime:     8.52 cm/s TAPSE (M-mode): 0.8 cm LEFT ATRIUM             Index        RIGHT ATRIUM           Index LA diam:        3.20 cm 2.10 cm/m   RA Area:     10.60 cm LA Vol (A2C):   27.7 ml 18.18 ml/m  RA Volume:   23.00 ml  15.10 ml/m LA Vol (A4C):   32.3 ml 21.20 ml/m LA Biplane Vol: 32.4 ml 21.27 ml/m  AORTIC VALVE                    PULMONIC VALVE AV Area (Vmax):    2.02 cm     PV Vmax:          0.65 m/s AV Area (Vmean):   1.89 cm     PV Peak grad:     1.7 mmHg AV Area (VTI):     1.87 cm     PR End Diast Vel: 2.33 msec AV Vmax:           95.00 cm/s AV Vmean:          62.200 cm/s AV VTI:            0.144 m AV Peak Grad:      3.6 mmHg AV Mean Grad:      2.0 mmHg LVOT Vmax:         75.50 cm/s LVOT Vmean:        46.100 cm/s LVOT VTI:          0.106 m LVOT/AV VTI ratio: 0.74  AORTA Ao Root diam: 2.50 cm Ao Asc diam:  2.80 cm MITRAL VALVE               TRICUSPID VALVE MV Area (PHT): 6.83 cm     TR Peak grad:   21.0 mmHg MV Decel Time: 111 msec    TR Vmax:        229.00 cm/s MV E velocity: 99.30 cm/s MV A velocity: 32.80 cm/s  SHUNTS MV E/A ratio:  3.03        Systemic VTI:  0.11 m  Systemic Diam: 1.80 cm Lennie Odor MD Electronically signed by Lennie Odor MD Signature Date/Time: 05/29/2023/3:22:31 PM    Final    EEG adult  Result Date: 05/29/2023 Charlsie Quest, MD     05/29/2023  5:18 PM Patient Name: Alasha Mckeague MRN: [id number] Epilepsy Attending: Charlsie Quest Referring Physician/Provider: Marvel Plan, MD Date: 05/29/2023 Duration: 25.11 mins Patient history: 42yo F with ams getting eeg to evaluate for seizure Level of alertness: comatose/ lethargic AEDs during EEG study: None Technical aspects: This EEG study was done with scalp electrodes positioned according to the 10-20 International system of electrode placement. Electrical activity was reviewed with band pass filter of 1-70Hz , sensitivity of 7 uV/mm, display speed of 54mm/sec with a 60Hz  notched filter applied as appropriate. EEG data were recorded continuously and digitally stored.  Video monitoring was available and reviewed as appropriate. Description: EEG showed continuous generalized and maximal right fronto-parietal rhythmic 2-3hz  delta slowing. Hyperventilation and photic stimulation were not performed.   ABNORMALITY - Continuous slow, generalized and maximal right fronto-parietal IMPRESSION: This study is suggestive of cortical dysfunction arising from right fronto-parietal, likely secondary to underlying structural abnormality. Additionally there is severe diffuse encephalopathy. No seizures or epileptiform discharges were seen throughout the recording. Charlsie Quest   CT ANGIO HEAD NECK W WO CM  Result Date: 05/29/2023 CLINICAL DATA:  Stroke, determine embolic source. EXAM: CT ANGIOGRAPHY HEAD AND NECK WITH AND WITHOUT CONTRAST TECHNIQUE: Multidetector CT imaging of the head and neck  was performed using the standard protocol during bolus administration of intravenous contrast. Multiplanar CT image reconstructions and MIPs were obtained to evaluate the vascular anatomy. Carotid stenosis measurements (when applicable) are obtained utilizing NASCET criteria, using the distal internal carotid diameter as the denominator. RADIATION DOSE REDUCTION: This exam was performed according to the departmental dose-optimization program which includes automated exposure control, adjustment of the mA and/or kV according to patient size and/or use of iterative reconstruction technique. 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 Chronic small vessel ischemic gliosis in the cerebral white matter with chronic perforator infarct in the left frontal white matter better seen on prior MRI. No evidence of acute hemorrhage, hydrocephalus, or mass. Generalized brain atrophy, premature for age. Vascular: No hyperdense vessel or unexpected calcification. Skull: Sclerotic skull attributed to the end-stage renal disease. Sinuses/Orbits: Chronic left mastoid opacification with under pneumatization and sclerosis. Review of the MIP images confirms the above findings CTA NECK FINDINGS Aortic arch: Unremarkable Right carotid system: Mild atheromatous plaque at the bifurcation. No stenosis or ulceration. Left carotid system: Mild atheromatous plaque at the bifurcation. No stenosis or ulceration. Vertebral arteries: No proximal subclavian stenosis. Scattered calcified plaque without flow reducing stenosis, beading, or ulceration. Skeleton: Negative Other neck: Scattered intravenous gas attributed to IV access. Small right globe with signs  of chronic sub retinal collection. Upper chest: Small branching micro nodules in the apical lungs. Review of the MIP images confirms the above findings CTA HEAD FINDINGS Anterior circulation: Atheromatous calcification of the cavernous carotids to the degree that lumen visualization is difficult. No branch occlusion, beading, aneurysm, or proximal flow reducing stenosis. Posterior circulation: Vertebral and basilar arteries are smoothly contoured and widely patent. Calcified plaque is seen along the bilateral V4 segments.  No branch occlusion, beading, aneurysm, or proximal flow reducing stenosis. Venous sinuses: Unremarkable Anatomic variants: None significant Review of the MIP images confirms the above findings IMPRESSION: Head CT: 1. Cortical calcification involving the right more than left posterior cerebral hemispheres, likely from ischemia or PRES and accentuated by end-stage renal disease with calcium dysregulation. 2. No acute hemorrhage. CTA: 1. No emergent vascular finding. 2. Atherosclerosis without flow reducing stenosis or ulceration of major arteries in the head and neck. Limited assessment of the cavernous carotids due to the degree of calcified plaque obscuring the lumen. 3. Micro nodules in the apical lungs suggesting bronchiolitis, correlate for acute infectious symptoms. Electronically Signed   By: Tiburcio Pea M.D.   On: 05/29/2023 06:49   MR BRAIN WO CONTRAST  Result Date: 05/28/2023 CLINICAL DATA:  Stroke suspected (Ped 0-17y) EXAM: MRI HEAD WITHOUT CONTRAST TECHNIQUE: Multiplanar, multiecho pulse sequences of the brain and surrounding structures were obtained without intravenous contrast. COMPARISON:  Same day CT head FINDINGS: Limitations: Sagittal T1 weighted sequences were not acquired. Brain: There is a punctate acute infarct in the right frontal lobe (series 2, image 42). Cortical hyperintense signal on diffusion-weighted imaging in the right parieto-occipital region is favored to  represent regions of acute to subacute infarct. The hyperdense appearance seen on same day head CT likely represents infarct related enhancement. There are chronic infarcts in the left cerebellum in the left frontal lobe. Findings in the left parietal lobe likely represent changes related to cortical laminar necrosis. No acute hemorrhage. No hydrocephalus. No extra-axial fluid collection. No mass effect. No mass lesion. There are additional small punctate microhemorrhages predominantly in a central location, possibly secondary to hypertensive microangiopathy. Vascular: Normal flow voids. Skull and upper cervical spine: Normal marrow signal. Sinuses/Orbits: No mastoid or middle ear effusion. Mild mucosal thickening of the floor of the right maxillary sinus. Bilateral lens replacement. There are chronic changes involving the right globe with likely chronic layering vitreal blood products on the right. Other: None. IMPRESSION: 1. Punctate acute infarct in the right frontal lobe. 2. Cortical hyperintense signal on diffusion-weighted imaging in the right parieto-occipital region is favored to represent regions of acute to subacute infarct. The hyperdense appearance seen on same day head CT likely represents infarct related enhancement. Further evaluation with contrast-enhanced brain MRI is recommended. 3. 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 4. 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 These results will be called to the ordering clinician or representative by the Radiologist Assistant, and communication documented in the PACS or [employer]. Electronically Signed   By: Lorenza Cambridge M.D.   On: 05/28/2023 15:16   Medications:  anticoagulant sodium citrate     ceFEPime (MAXIPIME) IV Stopped (05/29/23 1521)   norepinephrine (LEVOPHED) Adult infusion Stopped (05/29/23 1828)   vancomycin Stopped (05/30/23 0248)   vasopressin Stopped (05/29/23 1702)    atorvastatin   20 mg Oral Daily   Chlorhexidine Gluconate Cloth  6 each Topical Daily   Chlorhexidine Gluconate Cloth  6 each Topical Q0600   [START ON 05/31/2023] darbepoetin (ARANESP) injection - DIALYSIS  60 mcg Subcutaneous Q Fri-1800   heparin  5,000 Units Subcutaneous Q8H   hydrocortisone sod succinate (SOLU-CORTEF) inj  100 mg Intravenous Q12H   insulin aspart  0-15 Units Subcutaneous Q4H   LORazepam  1 mg Intravenous Once   midodrine  10 mg Oral TID WC   sodium hypochlorite   Irrigation BID    Outpt HD: [facility]  MWF  three times per week 180 NR, 400/1.5 auto, 2K/2Ca, EDW 49kg, mircera 75 q2wks   Assessment/Plan **Shock: presumed septic, on broad spectrum abx and vasopressor support per PCCM. Also on midodrine. Blood cultures neg to date, on cefepime and vanc currently.  Suspected related to abd wound.    **ESRD on HD:  MWF last tx 10/16, next tomorrow.  AVF worked ok on [employer].  D/w PCCM ok to remove L temp HD catheter - they will attempt to obtain other access prior to d/cing line.    **Anemia:  Hb 6s, transfused per primary and now 9s.  consulted pharm to dose ESA = she was just here but I don't have a date for last dose.  On mircera 75q2wks outpt.    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  As above sepsis source thought to be wound - pt had been refusing wound vac and wound care.     **Secondary hyperPTH:  Ca was high and now improved after HD. Senispar on hold d/t GI issues. Not on VDRA. Last Phos 4, changed binders from sevelamer d/t SBO to Fosrenol.    **DM: per primary   **acute/subacute CVA: MRI confirmed poss infarcts, neuro to consult -  rec MRI w contrast but not advisable due to ESRD status. CTA has been obtained. EEG no seizure.     Will follow - page/message with concerns.   Estill Bakes MD 05/30/2023, 8:09 AM  [facility] Pager: [phone]

## 2023-05-30 NOTE — Progress Notes (Signed)
Heart Failure Navigator Progress Note  Assessed for Heart & Vascular TOC clinic readiness.  Patient does not meet criteria due to ESRD on hemodialysis.   Navigator will sign off at this time.    Rhae Hammock, BSN, Scientist, clinical (histocompatibility and immunogenetics) Only

## 2023-05-30 NOTE — Plan of Care (Signed)
Problem: Education: Goal: Knowledge of the prescribed therapeutic regimen will improve Outcome: Not Progressing Goal: Ability to verbalize activity precautions or restrictions will improve Outcome: Not Progressing Goal: Understanding of discharge needs will improve Outcome: Not Progressing   Problem: Activity: Goal: Ability to perform//tolerate increased activity and mobilize with assistive devices will improve Outcome: Not Progressing   Problem: Clinical Measurements: Goal: Postoperative complications will be avoided or minimized Outcome: Not Progressing   Problem: Self-Care: Goal: Ability to meet self-care needs will improve Outcome: Not Progressing   Problem: Self-Concept: Goal: Ability to maintain and perform role responsibilities to the fullest extent possible will improve Outcome: Not Progressing   Problem: Pain Management: Goal: Pain level will decrease with appropriate interventions Outcome: Not Progressing   Problem: Skin Integrity: Goal: Demonstration of wound healing without infection will improve Outcome: Not Progressing   Problem: Activity: Goal: Risk for activity intolerance will decrease Outcome: Not Progressing   Problem: Nutrition: Goal: Adequate nutrition will be maintained Outcome: Not Progressing   Problem: Coping: Goal: Level of anxiety will decrease Outcome: Not Progressing   Problem: Elimination: Goal: Will not experience complications related to bowel motility Outcome: Not Progressing Goal: Will not experience complications related to urinary retention Outcome: Not Progressing   Problem: Pain Managment: Goal: General experience of comfort will improve Outcome: Not Progressing   Problem: Safety: Goal: Ability to remain free from injury will improve Outcome: Not Progressing   Problem: Skin Integrity: Goal: Risk for impaired skin integrity will decrease Outcome: Not Progressing   Problem: Education: Goal: Ability to describe  self-care measures that may prevent or decrease complications (Diabetes Survival Skills Education) will improve Outcome: Not Progressing Goal: Individualized Educational Video(s) Outcome: Not Progressing   Problem: Coping: Goal: Ability to adjust to condition or change in health will improve Outcome: Not Progressing   Problem: Fluid Volume: Goal: Ability to maintain a balanced intake and output will improve Outcome: Not Progressing   Problem: Health Behavior/Discharge Planning: Goal: Ability to identify and utilize available resources and services will improve Outcome: Not Progressing Goal: Ability to manage health-related needs will improve Outcome: Not Progressing   Problem: Metabolic: Goal: Ability to maintain appropriate glucose levels will improve Outcome: Not Progressing   Problem: Nutritional: Goal: Maintenance of adequate nutrition will improve Outcome: Not Progressing Goal: Progress toward achieving an optimal weight will improve Outcome: Not Progressing   Problem: Skin Integrity: Goal: Risk for impaired skin integrity will decrease Outcome: Not Progressing   Problem: Tissue Perfusion: Goal: Adequacy of tissue perfusion will improve Outcome: Not Progressing   Problem: Education: Goal: Knowledge of disease or condition will improve Outcome: Not Progressing Goal: Knowledge of secondary prevention will improve (MUST DOCUMENT ALL) Outcome: Not Progressing Goal: Knowledge of patient specific risk factors will improve [name] N/A or DELETE if not current risk factor) Outcome: Not Progressing   Problem: Ischemic Stroke/TIA Tissue Perfusion: Goal: Complications of ischemic stroke/TIA will be minimized Outcome: Not Progressing   Problem: Coping: Goal: Will verbalize positive feelings about self Outcome: Not Progressing Goal: Will identify appropriate support needs Outcome: Not Progressing   Problem: Health Behavior/Discharge Planning: Goal: Ability to manage  health-related needs will improve Outcome: Not Progressing Goal: Goals will be collaboratively established with patient/family Outcome: Not Progressing   Problem: Self-Care: Goal: Ability to participate in self-care as condition permits will improve Outcome: Not Progressing Goal: Verbalization of feelings and concerns over difficulty with self-care will improve Outcome: Not Progressing Goal: Ability to communicate needs accurately will improve Outcome: Not Progressing   Problem:  Nutrition: Goal: Risk of aspiration will decrease Outcome: Not Progressing Goal: Dietary intake will improve Outcome: Not Progressing   Problem: Education: Goal: Understanding of post-operative needs will improve Outcome: Not Progressing Goal: Individualized Educational Video(s) Outcome: Not Progressing   Problem: Clinical Measurements: Goal: Postoperative complications will be avoided or minimized Outcome: Not Progressing   Problem: Respiratory: Goal: Will regain and/or maintain adequate ventilation Outcome: Not Progressing
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
## 2023-05-30 NOTE — Progress Notes (Signed)
Daily Progress Note   Patient Name: [name]       Date: 05/30/2023 DOB: 1981/04/10  Age: 42 y.o. MRN#: [id number] Attending Physician: Karren Burly, MD Primary Care Physician: [facility] Admit Date: 06/02/2023  Reason for Consultation/Follow-up: Establishing goals of care  Subjective: Medical records reviewed including progress notes, labs, and imaging. Patient assessed at the bedside. She appears lethargic, tells me she is in pain but does not quantify severity. Per MAR, unable to receive another dose of PRN oxycodone yet. No visitors present.   Attempted to call her HCPOA/sister and husband again but still unable to reach with phone numbers listed on chart. I informed patient of this but she did not provide any other contact information or respond much at all to my attempts at conversation. She quickly goes back to sleep.    Questions and concerns addressed. PMT will continue to support holistically.   Length of Stay: 3   Physical Exam Vitals and nursing note reviewed.  Cardiovascular:     Rate and Rhythm: Normal rate.  Pulmonary:     Effort: Pulmonary effort is normal.  Neurological:     Mental Status: She is lethargic.  Psychiatric:        Attention and Perception: She is inattentive.        Mood and Affect: Affect is flat.        Behavior: Behavior is withdrawn.            Vital Signs: BP 115/75   Pulse 93   Temp 97.8 F (36.6 C) (Oral)   Resp (!) 9   Wt 53.6 kg   SpO2 100%   BMI 22.33 kg/m  SpO2: SpO2: 100 % O2 Device: O2 Device: Nasal Cannula O2 Flow Rate: O2 Flow Rate (L/min): 3 L/min      Palliative Assessment/Data: 30% (on tube feeds)   Palliative Care Assessment & Plan   Patient Profile: 42 y.o. female  with past  medical history of insulin-dependent diabetes mellitus, resultant end-stage renal disease, bilateral AKA, blindness and severe peripheral arterial disease admitted on 05/15/2023 with hypotension in dialysis.    She had a recent prolonged hospital stay in September 2024 for small bowel ischemia with resultant small bowel obstruction. She had an extended ileal cecotomy placed. She was discharged to SNF 3 days before this admission.  Per chart, patient was not allowing dressing changes for her abdominal wound.  She is now admitted with septic shock secondary to suspected wound infection and acute encephalopathy.   PMT has been consulted to assist with goals of care conversation.  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  Recommendations/Plan: Patient does not participate in GOC discussions today despite encouragement and reassurance. Unable to reach any family members after multiple attempts  Agree with ongoing cognitive assessments acutely  PMT will continue to follow peripherally and re-engage when opportunity for further conversation arises    Prognosis: Guarded to poor given several chronic comorbidities, repeat hospitalizations, concerns with adherence to care plan   Discharge Planning: To Be Determined   Total time: I spent 25 minutes  in the care of the patient today in the above activities and documenting the encounter.         Loleta Frommelt Jeni Salles, PA-C  Palliative Medicine Team Team phone # [phone]  Thank you for allowing the Palliative Medicine Team to assist in the care of this patient. Please utilize secure chat with additional questions, if there is no response within 30 minutes please call the above phone number.  Palliative Medicine Team providers are available by phone from 7am to 7pm daily and can be reached through the team cell phone.  Should this  patient require assistance outside of these hours, please call the patient's attending physician.

## 2023-05-30 NOTE — Inpatient Diabetes Management (Signed)
Inpatient Diabetes Program Recommendations  AACE/ADA: New Consensus Statement on Inpatient Glycemic Control (2015)  Target Ranges:  Prepandial:   less than 140 mg/dL      Peak postprandial:   less than 180 mg/dL (1-2 hours)      Critically ill patients:  140 - 180 mg/dL   Lab Results  Component Value Date   GLUCAP 121 (H) 05/30/2023   HGBA1C 5.7 (H) 04/30/2023    Review of Glycemic Control  Latest Reference Range & Units 05/29/23 07:44 05/29/23 11:39 05/29/23 15:39 05/29/23 19:15 05/29/23 23:15 05/30/23 03:20 05/30/23 04:30 05/30/23 07:54  Glucose-Capillary 70 - 99 mg/dL 409 (H) 811 (H) 914 (H) 122 (H) 85 47 (L) 104 (H) 121 (H)  (H): Data is abnormally high (L): Data is abnormally low  Diabetes history: DM Outpatient Diabetes medications:  Dexcom sensor, Fiasp 1-10 units 4 times a day Semglee 10 units daily Current orders for Inpatient glycemic control:  Novolog 0-15 units q 4 hours  Solucortef 100 mg bid  Note: hypoglycemia on current Novolog correction scale.  Inpatient Diabetes Program Recommendations:    -   Consider reducing to Novolog sensitive (0-9 units) q 4 hours due to ESRD.  Will follow.   Thanks,  Christena Deem RN, MSN, BC-ADM Inpatient Diabetes Coordinator Team Pager [phone] (8a-5p)

## 2023-05-30 NOTE — Progress Notes (Signed)
NAME:  [name], MRN:  [id number], DOB:  [birth year], LOS: 3 ADMISSION DATE:  05/31/2023, CONSULTATION DATE:  05/30/2023 REFERRING MD: EDP, CHIEF COMPLAINT:  septic shock  History of Present Illness:  [name] is a 42 year old woman with insulin-dependent diabetes mellitus, resultant end-stage renal disease, bilateral AKA, blindness and severe peripheral arterial disease.  She presents hypotensive from dialysis today.  She had a recent prolonged hospital stay in September 2024 for small bowel ischemia with resultant small bowel obstruction.  She had an extended ileal cecotomy placed.  She was discharged 3 days ago to SNF.  Of note her AV fistula cannot be accessed due to pseudoaneurysm and repeated clotting.  She thus has a dialysis catheter placed in the right chest.  There is report that she was not adherent to dressing changes for her open abdominal wound, and did not agree to wound VAC.  She was found to be hypotensive in the ED and started on 2 vasopressors.  PCCM consulted for admission.  History is limited and obtained primarily from chart review and discussion with EDP.  Pertinent  Medical History  IDDM ESRD Bilateral AKA Blindness Severe PAD and recd  Significant Hospital Events: Including procedures, antibiotic start and stop dates in addition to other pertinent events   05/23/2023 admitted to the hospital with hypotension after dialysis concern for septic shock 05/29/2023 tolerated HD, weaning off pressors  Interim History / Subjective:  Off vasopressors.  Tolerating oral midodrine.  Poor appetite.  Ready to leave the unit.  Objective   Blood pressure 123/81, pulse 93, temperature 97.8 F (36.6 C), temperature source Oral, resp. rate 11, weight 53.6 kg, SpO2 100%.        Intake/Output Summary (Last 24 hours) at 05/30/2023 0940 Last data filed at 05/30/2023 0400 Gross per 24 hour  Intake 675.26 ml  Output 2650 ml  Net -1974.74 ml   Filed Weights   05/28/23  0356 05/29/23 0247 05/30/23 0500  Weight: 52.9 kg 54.7 kg 53.6 kg    Examination: General: chronically ill appearing, older than stated age HENT: dry mucus membranes Lungs: breathing non labored, lungs clear Cardiovascular: tachycardic, regular Abdomen: open abdominal wound ventral incision, cecotomy pouch in place, Extremities: bilateral AKA Neuro: awake, blind, not oriented to situation GU: cecotomy. In diaper  MRI Punctate acute infarct R frontal love, acute to subacute infarct right parieto occipital infarct  Resolved Hospital Problem list   Acute encephalopathy  Assessment & Plan:  Septic Shock secondary to suspected wound/Skin soft tissue infection Encephalopathy due to septic shock/severe sepsis -Continue Vancomycin cefepime plan 7 days, flagyl d/c'd 10/16   -SBP goal of 90, appears baseline during recent admission, wean pressors -Baseline BP look 80s-90s -Stress dose steroids started 10/15, d/c 10/17 - resumed home midodrine 10/15 - Appreciate surgery assistance - Palliative care consult with severe co morbid chronic organ failure and refusing or fighting care for wound  End-stage renal disease, likely diabetic nephropathy -Has temporary HD catheter (trialysis), used fistula 10/16 -Plan remove temp cath -Appreciate nephrology assistance  Type 2 diabetes Severe peripheral or vascular disease Recent small bowel ischemia Bilateral AKA -Semglee, sliding scale insulin  Punctate acute infarct R frontal love, acute to subacute infarct right parieto occipital infarct: --appreciate neurology assistance, stroke team to follow -- EEG without seizures, dysfunction likely at site of stroke  Best Practice (right click and "Reselect all SmartList Selections" daily)   Diet/type: NPO DVT prophylaxis: prophylactic heparin  GI prophylaxis: N/A Lines: Dialysis Catheter Foley:  N/A Code  Status:  full code Last date of multidisciplinary goals of care discussion [facility] care  consulted 10/16]  Labs   CBC: Recent Labs  Lab 05/24/23 0549 05/25/23 1159 05/25/2023 1510 05/31/2023 1511 05/28/23 0403 05/29/23 0505 05/29/23 0733 05/29/23 1610 05/30/23 0426  WBC 10.5 12.6* 12.5*  --  27.9*  --  16.6*  --  16.1*  NEUTROABS 7.2 10.3* 10.6*  --   --   --   --   --   --   HGB 9.4* 8.5* 7.5*   < > 7.1* 7.8* 6.6* 8.9* 9.1*  HCT 31.1* 30.2* 25.3*   < > 23.7* 23.0* 21.4* 27.2* 27.0*  MCV 89.1 91.5 88.8  --  88.4  --  86.6  --  87.9  PLT 334 349 316  --  416*  --  308  --  231   < > = values in this interval not displayed.    Basic Metabolic Panel: Recent Labs  Lab 05/24/23 0549 05/25/23 1159 06/12/2023 1510 06/01/2023 1511 05/14/2023 1514 05/28/23 0403 05/29/23 0505 05/29/23 0733 05/30/23 0426  NA 133* 134* 137   < > 137 129* 128* 128* 129*  K 4.2 4.5 4.0   < > 3.9 3.1* 5.4* 5.2* 3.9  CL 94* 91* 94*  --  95* 94*  --  97* 93*  CO2 25 20* 24  --   --  19*  --  18* 22  GLUCOSE 175* 282* 111*  --  113* 308*  --  130* 151*  BUN 20 18 19   --  20 21*  --  28* 14  CREATININE 4.22* 3.98* 3.48*  --  3.70* 3.78*  --  4.15* 2.76*  CALCIUM 10.8* 11.7* 10.0  --   --  10.1  --  10.8* 9.9  MG  --   --  1.7  --   --  1.5*  --   --   --   PHOS 5.1*  --   --   --   --  4.0  --  5.8* 3.9   < > = values in this interval not displayed.   GFR: Estimated Creatinine Clearance: 20.2 mL/min (A) (by C-G formula based on SCr of 2.76 mg/dL (H)). Recent Labs  Lab 05/25/23 1510 05/18/2023 1510 06/12/2023 1513 05/28/23 0403 05/28/23 0920 05/29/23 0733 05/30/23 0426  WBC  --  12.5*  --  27.9*  --  16.6* 16.1*  LATICACIDVEN 1.2  --  2.4* 3.5* 2.2*  --   --     Liver Function Tests: Recent Labs  Lab 05/24/23 0549 05/25/23 1159 05/29/2023 1510 05/29/23 0733 05/30/23 0426  AST  --  14* 59*  --   --   ALT  --  13 25  --   --   ALKPHOS  --  101 124  --   --   BILITOT  --  1.0 1.0  --   --   PROT  --  8.4* 7.5  --   --   ALBUMIN 2.4* 2.8* 2.4* 1.9* 2.0*   Recent Labs  Lab  05/25/23 1159  LIPASE 55*   No results for input(s): "AMMONIA" in the last 168 hours.  ABG    Component Value Date/Time   PHART 7.383 05/29/2023 0505   PCO2ART 31.4 (L) 05/29/2023 0505   PO2ART 256 (H) 05/29/2023 0505   HCO3 18.6 (L) 05/29/2023 0505   TCO2 20 (L) 05/29/2023 0505   ACIDBASEDEF 6.0 (H) 05/29/2023 0505   O2SAT 100  05/29/2023 0505     Coagulation Profile: No results for input(s): "INR", "PROTIME" in the last 168 hours.  Cardiac Enzymes: No results for input(s): "CKTOTAL", "CKMB", "CKMBINDEX", "TROPONINI" in the last 168 hours.  HbA1C: Hgb A1c MFr Bld  Date/Time Value Ref Range Status  04/30/2023 09:30 AM 5.7 (H) 4.8 - 5.6 % Final    Comment:    (NOTE) Pre diabetes:          5.7%-6.4%  Diabetes:              >6.4%  Glycemic control for   <7.0% adults with diabetes   12/20/2022 01:40 PM 8.2 (H) 4.8 - 5.6 % Final    Comment:    (NOTE) Pre diabetes:          5.7%-6.4%  Diabetes:              >6.4%  Glycemic control for   <7.0% adults with diabetes     CBG: Recent Labs  Lab 05/29/23 1915 05/29/23 2315 05/30/23 0320 05/30/23 0430 05/30/23 0754  GLUCAP 122* 85 47* 104* 121*    Review of Systems:   +abdominal pain  Past Medical History:  She,  has a past medical history of Anemia, Anxiety, Blind right eye (2008), Diabetes mellitus without complication (HCC), Embolic stroke (HCC), ESRD (end stage renal disease) (HCC), ESRD on hemodialysis (HCC), and GBS (Guillain Barre syndrome) (HCC).   Surgical History:   Past Surgical History:  Procedure Laterality Date   AMPUTATION Right 08/11/2021   Procedure: TRANSMETATARSAL AMPUTATION OF RIGHT FOOT;  Surgeon: Nada Libman, MD;  Location: [facility] OR;  Service: Vascular;  Laterality: Right;   AMPUTATION Right 09/17/2021   Procedure: AMPUTATION RIGHT BELOW KNEE;  Surgeon: Nada Libman, MD;  Location: [facility] OR;  Service: Vascular;  Laterality: Right;   AMPUTATION Right 10/11/2021   Procedure: RIGHT ABOVE  KNEE AMPUTATION;  Surgeon: Nada Libman, MD;  Location: [facility] OR;  Service: Vascular;  Laterality: Right;   AMPUTATION Left 07/02/2022   Procedure: LEFT ABOVE KNEE AMPUTATION;  Surgeon: Chuck Hint, MD;  Location: [facility] OR;  Service: Vascular;  Laterality: Left;   AMPUTATION TOE Left    APPLICATION OF WOUND VAC  08/16/2021   Procedure: APPLICATION OF WOUND VAC;  Surgeon: Nada Libman, MD;  Location: [facility] OR;  Service: Vascular;;   AV FISTULA PLACEMENT     BUBBLE STUDY  10/02/2021   Procedure: BUBBLE STUDY;  Surgeon: Pricilla Riffle, MD;  Location: [facility] ENDOSCOPY;  Service: Cardiovascular;;   CESAREAN SECTION  2011   COLOSTOMY Right 05/04/2023   Procedure: COLOSTOMY;  Surgeon: Moise Boring, MD;  Location: [facility] OR;  Service: General;  Laterality: Right;   FISTULA SUPERFICIALIZATION Right 07/27/2019   Procedure: 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;  Surgeon: Chuck Hint, MD;  Location: [facility] OR;  Service: Vascular;  Laterality: Right;   INSERTION OF DIALYSIS CATHETER Left 07/27/2019   Procedure: INSERTION OF TUNNELED  DIALYSIS CATHETER;  Surgeon: Chuck Hint, MD;  Location: [facility] OR;  Service: Vascular;  Laterality: Left;   INSERTION OF DIALYSIS CATHETER Left 10/23/2021   Procedure: INSERTION OF TUNNELED PALINDROME PRECISION DIALYSIS CATHETER (23cm);  Surgeon: Cephus Shelling, MD;  Location: [facility] OR;  Service: Vascular;  Laterality: Left;   IR AV DIALY SHUNT INTRO NEEDLE/INTRACATH INITIAL W/PTA/IMG RIGHT Right 05/14/2023   IR FLUORO GUIDE CV LINE LEFT  10/16/2021   IR FLUORO GUIDE CV LINE LEFT  05/10/2023  IR FLUORO GUIDE CV LINE RIGHT  10/11/2021   IR GASTROSTOMY TUBE MOD SED  10/30/2021   IR GASTROSTOMY TUBE REMOVAL  05/08/2022   IR US GUIDE VASC ACCESS LEFT  10/16/2021   IR US GUIDE VASC ACCESS LEFT  05/10/2023   IR US GUIDE VASC ACCESS RIGHT  10/11/2021   IR US GUIDE VASC ACCESS RIGHT  05/14/2023   LAPAROSCOPY N/A 05/04/2023   Procedure: LAPAROSCOPY  DIAGNOSTIC;  Surgeon: Moise Boring, MD;  Location: [facility] OR;  Service: General;  Laterality: N/A;   LAPAROTOMY N/A 05/04/2023   Procedure: EXPLORATION LAPAROTOMY WITH ILEO-CAECAL RESECTION;  Surgeon: Moise Boring, MD;  Location: [facility] OR;  Service: General;  Laterality: N/A;   LEFT HEART CATH AND CORONARY ANGIOGRAPHY N/A 07/26/2022   Procedure: LEFT HEART CATH AND CORONARY ANGIOGRAPHY;  Surgeon: Swazil[place], Peter M, MD;  Location: [facility] INVASIVE CV LAB;  Service: Cardiovascular;  Laterality: N/A;   LOWER EXTREMITY ANGIOGRAPHY N/A 08/11/2021   Procedure: LOWER EXTREMITY ANGIOGRAPHY;  Surgeon: Nada Libman, MD;  Location: [facility] INVASIVE CV LAB;  Service: Cardiovascular;  Laterality: N/A;   REVISON OF ARTERIOVENOUS FISTULA Right 10/23/2021   Procedure: REVISON OF ARTERIOVENOUS FISTULA  ARM AND PLICATION;  Surgeon: Cephus Shelling, MD;  Location: [facility] OR;  Service: Vascular;  Laterality: Right;   TEE WITHOUT CARDIOVERSION N/A 10/02/2021   Procedure: TRANSESOPHAGEAL ECHOCARDIOGRAM (TEE);  Surgeon: Pricilla Riffle, MD;  Location: [facility] ENDOSCOPY;  Service: Cardiovascular;  Laterality: N/A;   TRANSMETATARSAL AMPUTATION Right 08/16/2021   Procedure: CLOSURE OF RIGHT TRANSMETATARSAL AMPUTATION;  Surgeon: Nada Libman, MD;  Location: [facility] OR;  Service: Vascular;  Laterality: Right;     Social History:   reports that she has never smoked. She has never used smokeless tobacco. She reports that she does not drink alcohol and does not use drugs.   Family History:  Her family history includes Diabetes in her father and mother.   Allergies Allergies  Allergen Reactions   Morphine Rash and Other (See Comments)    Per patient MAR.   Peanut-Containing Drug Products Anaphylaxis and Hives   Penicillins Rash and Other (See Comments)    Rash in 2008.  Tolerated cefazolin in 2020. No reaction listed on MAR.   Shellfish Allergy Swelling   Chocolate Hives     Home Medications  Prior to Admission medications    Medication Sig Start Date End Date Taking? Authorizing Provider  Accu-Chek FastClix Lancets MISC Use as instructed to check blood sugar up to TID. E11.22 Patient taking differently: 1 each by Other route See admin instructions. Use as instructed to check blood sugar up to TID. E11.22 04/15/19   Hoy Register, MD  acetaminophen (TYLENOL) 325 MG tablet Take 650 mg by mouth every 6 (six) hours as needed for fever or mild pain.    [provider]  atorvastatin (LIPITOR) 40 MG tablet Place 1 tablet (40 mg total) into feeding tube daily. Patient taking differently: Take 40 mg by mouth daily. 10/26/21   Burnadette Pop, MD  azelastine (OPTIVAR) 0.05 % ophthalmic solution Place 1 drop into both eyes 2 (two) times daily as needed (allergies). 11/13/22   [provider]  cetirizine (ZYRTEC) 10 MG tablet Take 10 mg by mouth daily.    [provider]  Continuous Blood Gluc Transmit (DEXCOM G6 TRANSMITTER) MISC 1 Device by Does not apply route as directed. 09/12/20   Shamleffer, Konrad Dolores, MD  Dorzolamide HCl-Timolol Mal PF 2-0.5 % SOLN Place 1 drop into  the left eye in the morning and at bedtime.    [provider]  dorzolamide-timolol (COSOPT) 22.3-6.8 MG/ML ophthalmic solution Place 1 drop into the left eye 2 (two) times daily. 03/30/21   [provider]  dronabinol (MARINOL) 5 MG capsule Take 1 capsule (5 mg total) by mouth 2 (two) times daily before lunch and supper. 05/24/23   Rhetta Mura, MD  glucagon, human recombinant, (GLUCAGEN DIAGNOSTIC) 1 MG injection Inject 1 mg into the vein once as needed for low blood sugar.    [provider]  glucose blood (ACCU-CHEK GUIDE) test strip Use as instructed to check blood sugar up to TID. E11.22 Patient taking differently: 1 each by Other route See admin instructions. Use as instructed to check blood sugar up to TID. E11.22 07/17/19   Fulp, Cammie, MD  HYDROcodone-acetaminophen (NORCO/VICODIN) 5-325 MG  tablet Take 1 tablet by mouth every 6 (six) hours as needed for moderate pain. 05/24/23   Rhetta Mura, MD  insulin aspart (FIASP FLEXTOUCH) 100 UNIT/ML FlexTouch Pen Inject 1-10 Units into the skin 4 (four) times daily -  before meals and at bedtime. Inject as per sliding scale: If 121-150= 1 units call MD if blood sugar <70 or 400>; 151-200= 2 units 201-250= 3 units 251-300= 5 units 301-350= 7 units 351-400= 10 units Subcutaneously before meals and at bedtime.    [provider]  insulin glargine (SEMGLEE) 100 UNIT/ML injection Inject 0.1 mLs (10 Units total) into the skin daily at 6 PM. Patient taking differently: Inject 10 Units into the skin daily. 12/28/22   Lorin Glass, MD  lanthanum (FOSRENOL) 1000 MG chewable tablet Chew 1 tablet (1,000 mg total) by mouth 3 (three) times daily with meals. 05/24/23   Rhetta Mura, MD  loperamide (IMODIUM) 2 MG capsule Take 1 capsule (2 mg total) by mouth every 6 (six) hours as needed for diarrhea or loose stools. 05/24/23   Rhetta Mura, MD  midodrine (PROAMATINE) 10 MG tablet Take 1 tablet (10 mg total) by mouth 3 (three) times daily with meals. 05/24/23   Rhetta Mura, MD  Multiple Vitamin (MULTIVITAMIN WITH MINERALS) TABS tablet Take 1 tablet by mouth daily.    [provider]  Nutritional Supplements (NUTRITIONAL DRINK) LIQD Take 120 mLs by mouth daily.    [provider]  ondansetron (ZOFRAN) 4 MG tablet Take 4 mg by mouth every 4 (four) hours as needed for nausea/vomiting. 03/02/22   [provider]  polycarbophil (FIBERCON) 625 MG tablet Take 2 tablets (1,250 mg total) by mouth 2 (two) times daily. 05/24/23   Rhetta Mura, MD  thiamine 100 MG tablet Place 1 tablet (100 mg total) into feeding tube daily. Patient taking differently: Take 100 mg by mouth daily. 10/26/21   Burnadette Pop, MD     Critical care time:     N/a   Karren Burly [facility]  [facility] 05/30/2023 9:40 AM  Pager: see AMION  If no response to pager , please call critical care on call (see AMION) until 7pm After 7:00 pm call Elink

## 2023-05-31 DIAGNOSIS — R6521 Severe sepsis with septic shock: Secondary | ICD-10-CM | POA: Diagnosis not present

## 2023-05-31 DIAGNOSIS — A419 Sepsis, unspecified organism: Secondary | ICD-10-CM | POA: Diagnosis not present

## 2023-05-31 LAB — GLUCOSE, CAPILLARY
Glucose-Capillary: 234 mg/dL — ABNORMAL HIGH (ref 70–99)
Glucose-Capillary: 174 mg/dL — ABNORMAL HIGH (ref 70–99)
Glucose-Capillary: 191 mg/dL — ABNORMAL HIGH (ref 70–99)
Glucose-Capillary: 139 mg/dL — ABNORMAL HIGH (ref 70–99)

## 2023-05-31 LAB — VANCOMYCIN, RANDOM: Vancomycin Rm: 21 ug/mL

## 2023-05-31 MED ORDER — VANCOMYCIN HCL 500 MG/100ML IV SOLN
500.0000 mg | INTRAVENOUS | Status: AC
  Administered 2023-06-03: 500 mg via INTRAVENOUS
  Filled 2023-05-31 (×2): qty 100

## 2023-05-31 MED ORDER — SODIUM CHLORIDE 0.9 % IV BOLUS
500.0000 mL | Freq: Once | INTRAVENOUS | Status: AC
  Administered 2023-05-31: 500 mL via INTRAVENOUS

## 2023-05-31 NOTE — Progress Notes (Addendum)
PROGRESS NOTE    [name]  AVW:[id number] DOB: [birth year] DOA: 05/27/2023 PCP: [facility]   Brief Narrative:  [name] is a 42 year old woman with insulin-dependent diabetes mellitus, resultant end-stage renal disease, bilateral AKA, blindness and severe peripheral arterial disease.  She presents hypotensive from dialysis today.  She had a recent prolonged hospital stay in September 2024 for small bowel ischemia with subsequent small bowel obstruction.  She had an extended ileal cecotomy. Ultimately discharged 3 days prior to admission to SNF for ongoing care/therapy.  Of note her AV fistula was unable to be accessed due to pseudoaneurysm and repeated clotting at the time of admission.  She thus has a dialysis catheter placed in the right chest.  There is report that she was not adherent to dressing changes for her open abdominal wound, and did not agree to wound VAC.  She was found to be hypotensive in the ED and started on 2 vasopressors.  PCCM consulted for admission.  05/27/2023 admitted to the hospital with hypotension after dialysis concern for septic shock 05/29/2023 tolerated HD via fistula, weaning off pressors 10/18 - transferred to [facility] service - remains hypotensive - fluid challenge ongoing - HD planned this afternoon  Assessment & Plan:   Principal Problem:   Septic shock (HCC)  Septic Shock secondary to suspected wound/skin/soft tissue infection, POA Acute metabolic encephalopathy -Continue Vancomycin cefepime plan 7 days, flagyl d/c'd 10/16   -Diastolic pressures remain low, fluid challenge today, off pressors 24 hours -Stress dose steroids completed -Continue midodrine - patient continues to be noncompliant with his medication likely exacerbating her hypotension - Appreciate surgery assistance - Palliative care consult with severe co morbid chronic organ failure and refusing or fighting wound care/medication administration/general care    End-stage renal disease, likely diabetic nephropathy -Temporary HD catheter removed -Appreciate nephrology assistance   Type 2 diabetes Severe peripheral or vascular disease Recent small bowel ischemia Bilateral AKA -Semglee, sliding scale insulin   Punctate acute infarct R frontal love, acute to subacute infarct right parieto occipital infarct: --appreciate neurology assistance, stroke team to follow -- EEG without seizures, dysfunction likely at site of stroke  DVT prophylaxis: heparin injection 5,000 Units Start: 05/27/23 2200   Code Status:   Code Status: Full Code  Family Communication: None present  Status is: Inpatient  Dispo: The patient is from: Facility              Anticipated d/c is to: Same              Anticipated d/c date is: 48 to 72 hours pending clinical course              Patient currently not medically stable for discharge  Consultants:  General surgery, PCCM, nephrology  Antimicrobials:  Cefepime, vancomycin  Subjective: No acute issues or events reported overnight  Objective: Vitals:   05/31/23 0430 05/31/23 0500 05/31/23 0530 05/31/23 0600  BP: (!) 141/83 (!) 148/80 139/81 (!) 151/91  Pulse:    79  Resp: 14 10 12 12   Temp:      TempSrc:      SpO2:    100%  Weight:  51 kg      Intake/Output Summary (Last 24 hours) at 05/31/2023 0655 Last data filed at 05/31/2023 0500 Gross per 24 hour  Intake 580 ml  Output 850 ml  Net -270 ml   Filed Weights   05/29/23 0247 05/30/23 0500 05/31/23 0500  Weight: 54.7 kg 53.6 kg 51  lb Date of Birth:  [birth year]      BSA:          1.523 m Patient Age:    42 years      BP:           123/55 mmHg Patient Gender: F             HR:           70 bpm. Exam Location:  Inpatient Procedure: 2D Echo, Cardiac Doppler and Color Doppler Indications:    Stroke I63.9  History:        Patient has prior history of Echocardiogram examinations, most                 recent 07/24/2022. CAD, PAD and Stroke; Risk                 Factors:Hypertension, Diabetes, Dyslipidemia and Non-Smoker.  Sonographer:    Dondra Prader RVT RCS Referring Phys: [id number] ASHISH ARORA  Sonographer Comments: Technically challenging due to wound, patient position and lack of cooperation. Patient appeared to be agitated. IMPRESSIONS  1. Left  ventricular ejection fraction, by estimation, is 25 to 30%. The left ventricle has severely decreased function. The left ventricle demonstrates regional wall motion abnormalities (see scoring diagram/findings for description). There is moderate asymmetric left ventricular hypertrophy of the septal segment. Indeterminate diastolic filling due to E-A fusion.  2. Right ventricular systolic function is moderately reduced. The right ventricular size is mildly enlarged.  3. Catheter tip noted in the RA.  4. The mitral valve is degenerative. Mild to moderate mitral valve regurgitation. No evidence of mitral stenosis.  5. Tricuspid valve regurgitation is moderate to severe.  6. Focal calcification of the NCC noted. The aortic valve is tricuspid. Aortic valve regurgitation is not visualized. Aortic valve sclerosis is present, with no evidence of aortic valve stenosis. Comparison(s): Changes from prior study are noted. The left ventricular function is significantly worse. LVEF now severely reduced with WMA. RV dysfunction now present. FINDINGS  Left Ventricle: Left ventricular ejection fraction, by estimation, is 25 to 30%. The left ventricle has severely decreased function. The left ventricle demonstrates regional wall motion abnormalities. The left ventricular internal cavity size was normal  in size. There is moderate asymmetric left ventricular hypertrophy of the septal segment. Indeterminate diastolic filling due to E-A fusion.  LV Wall Scoring: The apical lateral segment and mid anterolateral segment are akinetic. Right Ventricle: The right ventricular size is mildly enlarged. No increase in right ventricular wall thickness. Right ventricular systolic function is moderately reduced. Left Atrium: Left atrial size was normal in size. Right Atrium: Catheter tip noted in the RA. Right atrial size was normal in size. Pericardium: Trivial pericardial effusion is present. Mitral Valve: The mitral valve is degenerative in  appearance. Mild to moderate mitral annular calcification. Mild to moderate mitral valve regurgitation. No evidence of mitral valve stenosis. Tricuspid Valve: The tricuspid valve is grossly normal. Tricuspid valve regurgitation is moderate to severe. No evidence of tricuspid stenosis. Aortic Valve: Focal calcification of the NCC noted. The aortic valve is tricuspid. Aortic valve regurgitation is not visualized. Aortic valve sclerosis is present, with no evidence of aortic valve stenosis. Aortic valve mean gradient measures 2.0 mmHg. Aortic valve peak gradient measures 3.6 mmHg. Aortic valve area, by VTI measures 1.87 cm. Pulmonic Valve: The pulmonic valve was grossly normal. Pulmonic valve regurgitation is mild. No evidence of pulmonic stenosis. Aorta: The aortic root and ascending aorta are structurally normal,  lb Date of Birth:  [birth year]      BSA:          1.523 m Patient Age:    42 years      BP:           123/55 mmHg Patient Gender: F             HR:           70 bpm. Exam Location:  Inpatient Procedure: 2D Echo, Cardiac Doppler and Color Doppler Indications:    Stroke I63.9  History:        Patient has prior history of Echocardiogram examinations, most                 recent 07/24/2022. CAD, PAD and Stroke; Risk                 Factors:Hypertension, Diabetes, Dyslipidemia and Non-Smoker.  Sonographer:    Dondra Prader RVT RCS Referring Phys: [id number] ASHISH ARORA  Sonographer Comments: Technically challenging due to wound, patient position and lack of cooperation. Patient appeared to be agitated. IMPRESSIONS  1. Left  ventricular ejection fraction, by estimation, is 25 to 30%. The left ventricle has severely decreased function. The left ventricle demonstrates regional wall motion abnormalities (see scoring diagram/findings for description). There is moderate asymmetric left ventricular hypertrophy of the septal segment. Indeterminate diastolic filling due to E-A fusion.  2. Right ventricular systolic function is moderately reduced. The right ventricular size is mildly enlarged.  3. Catheter tip noted in the RA.  4. The mitral valve is degenerative. Mild to moderate mitral valve regurgitation. No evidence of mitral stenosis.  5. Tricuspid valve regurgitation is moderate to severe.  6. Focal calcification of the NCC noted. The aortic valve is tricuspid. Aortic valve regurgitation is not visualized. Aortic valve sclerosis is present, with no evidence of aortic valve stenosis. Comparison(s): Changes from prior study are noted. The left ventricular function is significantly worse. LVEF now severely reduced with WMA. RV dysfunction now present. FINDINGS  Left Ventricle: Left ventricular ejection fraction, by estimation, is 25 to 30%. The left ventricle has severely decreased function. The left ventricle demonstrates regional wall motion abnormalities. The left ventricular internal cavity size was normal  in size. There is moderate asymmetric left ventricular hypertrophy of the septal segment. Indeterminate diastolic filling due to E-A fusion.  LV Wall Scoring: The apical lateral segment and mid anterolateral segment are akinetic. Right Ventricle: The right ventricular size is mildly enlarged. No increase in right ventricular wall thickness. Right ventricular systolic function is moderately reduced. Left Atrium: Left atrial size was normal in size. Right Atrium: Catheter tip noted in the RA. Right atrial size was normal in size. Pericardium: Trivial pericardial effusion is present. Mitral Valve: The mitral valve is degenerative in  appearance. Mild to moderate mitral annular calcification. Mild to moderate mitral valve regurgitation. No evidence of mitral valve stenosis. Tricuspid Valve: The tricuspid valve is grossly normal. Tricuspid valve regurgitation is moderate to severe. No evidence of tricuspid stenosis. Aortic Valve: Focal calcification of the NCC noted. The aortic valve is tricuspid. Aortic valve regurgitation is not visualized. Aortic valve sclerosis is present, with no evidence of aortic valve stenosis. Aortic valve mean gradient measures 2.0 mmHg. Aortic valve peak gradient measures 3.6 mmHg. Aortic valve area, by VTI measures 1.87 cm. Pulmonic Valve: The pulmonic valve was grossly normal. Pulmonic valve regurgitation is mild. No evidence of pulmonic stenosis. Aorta: The aortic root and ascending aorta are structurally normal,  PROGRESS NOTE    [name]  AVW:[id number] DOB: [birth year] DOA: 05/27/2023 PCP: [facility]   Brief Narrative:  [name] is a 42 year old woman with insulin-dependent diabetes mellitus, resultant end-stage renal disease, bilateral AKA, blindness and severe peripheral arterial disease.  She presents hypotensive from dialysis today.  She had a recent prolonged hospital stay in September 2024 for small bowel ischemia with subsequent small bowel obstruction.  She had an extended ileal cecotomy. Ultimately discharged 3 days prior to admission to SNF for ongoing care/therapy.  Of note her AV fistula was unable to be accessed due to pseudoaneurysm and repeated clotting at the time of admission.  She thus has a dialysis catheter placed in the right chest.  There is report that she was not adherent to dressing changes for her open abdominal wound, and did not agree to wound VAC.  She was found to be hypotensive in the ED and started on 2 vasopressors.  PCCM consulted for admission.  05/27/2023 admitted to the hospital with hypotension after dialysis concern for septic shock 05/29/2023 tolerated HD via fistula, weaning off pressors 10/18 - transferred to [facility] service - remains hypotensive - fluid challenge ongoing - HD planned this afternoon  Assessment & Plan:   Principal Problem:   Septic shock (HCC)  Septic Shock secondary to suspected wound/skin/soft tissue infection, POA Acute metabolic encephalopathy -Continue Vancomycin cefepime plan 7 days, flagyl d/c'd 10/16   -Diastolic pressures remain low, fluid challenge today, off pressors 24 hours -Stress dose steroids completed -Continue midodrine - patient continues to be noncompliant with his medication likely exacerbating her hypotension - Appreciate surgery assistance - Palliative care consult with severe co morbid chronic organ failure and refusing or fighting wound care/medication administration/general care    End-stage renal disease, likely diabetic nephropathy -Temporary HD catheter removed -Appreciate nephrology assistance   Type 2 diabetes Severe peripheral or vascular disease Recent small bowel ischemia Bilateral AKA -Semglee, sliding scale insulin   Punctate acute infarct R frontal love, acute to subacute infarct right parieto occipital infarct: --appreciate neurology assistance, stroke team to follow -- EEG without seizures, dysfunction likely at site of stroke  DVT prophylaxis: heparin injection 5,000 Units Start: 05/27/23 2200   Code Status:   Code Status: Full Code  Family Communication: None present  Status is: Inpatient  Dispo: The patient is from: Facility              Anticipated d/c is to: Same              Anticipated d/c date is: 48 to 72 hours pending clinical course              Patient currently not medically stable for discharge  Consultants:  General surgery, PCCM, nephrology  Antimicrobials:  Cefepime, vancomycin  Subjective: No acute issues or events reported overnight  Objective: Vitals:   05/31/23 0430 05/31/23 0500 05/31/23 0530 05/31/23 0600  BP: (!) 141/83 (!) 148/80 139/81 (!) 151/91  Pulse:    79  Resp: 14 10 12 12   Temp:      TempSrc:      SpO2:    100%  Weight:  51 kg      Intake/Output Summary (Last 24 hours) at 05/31/2023 0655 Last data filed at 05/31/2023 0500 Gross per 24 hour  Intake 580 ml  Output 850 ml  Net -270 ml   Filed Weights   05/29/23 0247 05/30/23 0500 05/31/23 0500  Weight: 54.7 kg 53.6 kg 51  PROGRESS NOTE    [name]  AVW:[id number] DOB: [birth year] DOA: 05/27/2023 PCP: [facility]   Brief Narrative:  [name] is a 42 year old woman with insulin-dependent diabetes mellitus, resultant end-stage renal disease, bilateral AKA, blindness and severe peripheral arterial disease.  She presents hypotensive from dialysis today.  She had a recent prolonged hospital stay in September 2024 for small bowel ischemia with subsequent small bowel obstruction.  She had an extended ileal cecotomy. Ultimately discharged 3 days prior to admission to SNF for ongoing care/therapy.  Of note her AV fistula was unable to be accessed due to pseudoaneurysm and repeated clotting at the time of admission.  She thus has a dialysis catheter placed in the right chest.  There is report that she was not adherent to dressing changes for her open abdominal wound, and did not agree to wound VAC.  She was found to be hypotensive in the ED and started on 2 vasopressors.  PCCM consulted for admission.  05/27/2023 admitted to the hospital with hypotension after dialysis concern for septic shock 05/29/2023 tolerated HD via fistula, weaning off pressors 10/18 - transferred to [facility] service - remains hypotensive - fluid challenge ongoing - HD planned this afternoon  Assessment & Plan:   Principal Problem:   Septic shock (HCC)  Septic Shock secondary to suspected wound/skin/soft tissue infection, POA Acute metabolic encephalopathy -Continue Vancomycin cefepime plan 7 days, flagyl d/c'd 10/16   -Diastolic pressures remain low, fluid challenge today, off pressors 24 hours -Stress dose steroids completed -Continue midodrine - patient continues to be noncompliant with his medication likely exacerbating her hypotension - Appreciate surgery assistance - Palliative care consult with severe co morbid chronic organ failure and refusing or fighting wound care/medication administration/general care    End-stage renal disease, likely diabetic nephropathy -Temporary HD catheter removed -Appreciate nephrology assistance   Type 2 diabetes Severe peripheral or vascular disease Recent small bowel ischemia Bilateral AKA -Semglee, sliding scale insulin   Punctate acute infarct R frontal love, acute to subacute infarct right parieto occipital infarct: --appreciate neurology assistance, stroke team to follow -- EEG without seizures, dysfunction likely at site of stroke  DVT prophylaxis: heparin injection 5,000 Units Start: 05/27/23 2200   Code Status:   Code Status: Full Code  Family Communication: None present  Status is: Inpatient  Dispo: The patient is from: Facility              Anticipated d/c is to: Same              Anticipated d/c date is: 48 to 72 hours pending clinical course              Patient currently not medically stable for discharge  Consultants:  General surgery, PCCM, nephrology  Antimicrobials:  Cefepime, vancomycin  Subjective: No acute issues or events reported overnight  Objective: Vitals:   05/31/23 0430 05/31/23 0500 05/31/23 0530 05/31/23 0600  BP: (!) 141/83 (!) 148/80 139/81 (!) 151/91  Pulse:    79  Resp: 14 10 12 12   Temp:      TempSrc:      SpO2:    100%  Weight:  51 kg      Intake/Output Summary (Last 24 hours) at 05/31/2023 0655 Last data filed at 05/31/2023 0500 Gross per 24 hour  Intake 580 ml  Output 850 ml  Net -270 ml   Filed Weights   05/29/23 0247 05/30/23 0500 05/31/23 0500  Weight: 54.7 kg 53.6 kg 51  PROGRESS NOTE    [name]  AVW:[id number] DOB: [birth year] DOA: 05/27/2023 PCP: [facility]   Brief Narrative:  [name] is a 42 year old woman with insulin-dependent diabetes mellitus, resultant end-stage renal disease, bilateral AKA, blindness and severe peripheral arterial disease.  She presents hypotensive from dialysis today.  She had a recent prolonged hospital stay in September 2024 for small bowel ischemia with subsequent small bowel obstruction.  She had an extended ileal cecotomy. Ultimately discharged 3 days prior to admission to SNF for ongoing care/therapy.  Of note her AV fistula was unable to be accessed due to pseudoaneurysm and repeated clotting at the time of admission.  She thus has a dialysis catheter placed in the right chest.  There is report that she was not adherent to dressing changes for her open abdominal wound, and did not agree to wound VAC.  She was found to be hypotensive in the ED and started on 2 vasopressors.  PCCM consulted for admission.  05/27/2023 admitted to the hospital with hypotension after dialysis concern for septic shock 05/29/2023 tolerated HD via fistula, weaning off pressors 10/18 - transferred to [facility] service - remains hypotensive - fluid challenge ongoing - HD planned this afternoon  Assessment & Plan:   Principal Problem:   Septic shock (HCC)  Septic Shock secondary to suspected wound/skin/soft tissue infection, POA Acute metabolic encephalopathy -Continue Vancomycin cefepime plan 7 days, flagyl d/c'd 10/16   -Diastolic pressures remain low, fluid challenge today, off pressors 24 hours -Stress dose steroids completed -Continue midodrine - patient continues to be noncompliant with his medication likely exacerbating her hypotension - Appreciate surgery assistance - Palliative care consult with severe co morbid chronic organ failure and refusing or fighting wound care/medication administration/general care    End-stage renal disease, likely diabetic nephropathy -Temporary HD catheter removed -Appreciate nephrology assistance   Type 2 diabetes Severe peripheral or vascular disease Recent small bowel ischemia Bilateral AKA -Semglee, sliding scale insulin   Punctate acute infarct R frontal love, acute to subacute infarct right parieto occipital infarct: --appreciate neurology assistance, stroke team to follow -- EEG without seizures, dysfunction likely at site of stroke  DVT prophylaxis: heparin injection 5,000 Units Start: 05/27/23 2200   Code Status:   Code Status: Full Code  Family Communication: None present  Status is: Inpatient  Dispo: The patient is from: Facility              Anticipated d/c is to: Same              Anticipated d/c date is: 48 to 72 hours pending clinical course              Patient currently not medically stable for discharge  Consultants:  General surgery, PCCM, nephrology  Antimicrobials:  Cefepime, vancomycin  Subjective: No acute issues or events reported overnight  Objective: Vitals:   05/31/23 0430 05/31/23 0500 05/31/23 0530 05/31/23 0600  BP: (!) 141/83 (!) 148/80 139/81 (!) 151/91  Pulse:    79  Resp: 14 10 12 12   Temp:      TempSrc:      SpO2:    100%  Weight:  51 kg      Intake/Output Summary (Last 24 hours) at 05/31/2023 0655 Last data filed at 05/31/2023 0500 Gross per 24 hour  Intake 580 ml  Output 850 ml  Net -270 ml   Filed Weights   05/29/23 0247 05/30/23 0500 05/31/23 0500  Weight: 54.7 kg 53.6 kg 51

## 2023-05-31 NOTE — Progress Notes (Signed)
Progress Note     Subjective: Patient complaining of pain with dressing change and repeatedly stating "leave me alone". No family present. WOC present and changing Eakin's at the same time.   Objective: Vital signs in last 24 hours: Temp:  [97.6 F (36.4 C)-99.2 F (37.3 C)] 99.2 F (37.3 C) (10/18 0727) Pulse Rate:  [72-87] 79 (10/18 0900) Resp:  [0-20] 12 (10/18 0900) BP: (79-162)/(36-107) 149/76 (10/18 0900) SpO2:  [100 %] 100 % (10/18 0900) Arterial Line BP: (107-121)/(53-60) 121/60 (10/17 1230) Weight:  [51 kg] 51 kg (10/18 0500) Last BM Date : 05/30/23  Intake/Output from previous day: 10/17 0701 - 10/18 0700 In: 580 [P.O.:480; IV Piggyback:100] Out: 850 [Stool:850] Intake/Output this shift: Total I/O In: 240 [P.O.:240] Out: 60 [Stool:60]  PE: General: chronically ill appearing  Lungs: Normal effort on Coffey Abd: necrotic tissue surrounding viable ileostomy with some softening and slough. Midline wound with necrotic tissue on both sides and fibrinous exudate in base, some granulation tissue present as well. Blue green drainage decreased but present in midline with musty Walker    Lab Results:  Recent Labs    05/29/23 0733 05/29/23 1610 05/30/23 0426  WBC 16.6*  --  16.1*  HGB 6.6* 8.9* 9.1*  HCT 21.4* 27.2* 27.0*  PLT 308  --  231   BMET Recent Labs    05/29/23 0733 05/30/23 0426  NA 128* 129*  K 5.2* 3.9  CL 97* 93*  CO2 18* 22  GLUCOSE 130* 151*  BUN 28* 14  CREATININE 4.15* 2.76*  CALCIUM 10.8* 9.9   PT/INR No results for input(s): "LABPROT", "INR" in the last 72 hours. CMP     Component Value Date/Time   NA 129 (L) 05/30/2023 0426   NA 133 (L) 05/18/2020 0927   K 3.9 05/30/2023 0426   CL 93 (L) 05/30/2023 0426   CO2 22 05/30/2023 0426   GLUCOSE 151 (H) 05/30/2023 0426   BUN 14 05/30/2023 0426   BUN 45 (H) 05/18/2020 0927   CREATININE 2.76 (H) 05/30/2023 0426   CALCIUM 9.9 05/30/2023 0426   CALCIUM 12.1 (H) 11/13/2021 2026   PROT  7.5 05/29/2023 1510   PROT 7.6 05/18/2020 0927   ALBUMIN 2.0 (L) 05/30/2023 0426   ALBUMIN 3.9 05/18/2020 0927   AST 59 (H) 06/13/2023 1510   ALT 25 06/12/2023 1510   ALKPHOS 124 06/10/2023 1510   BILITOT 1.0 06/10/2023 1510   BILITOT <0.2 05/18/2020 0927   GFRNONAA 21 (L) 05/30/2023 0426   GFRAA 6 (L) 05/18/2020 0927   Lipase     Component Value Date/Time   LIPASE 55 (H) 05/25/2023 1159       Studies/Results: ECHOCARDIOGRAM COMPLETE  Result Date: 05/29/2023    ECHOCARDIOGRAM REPORT   Patient Name:   [name] Date of Exam: 05/29/2023 Medical Rec #:  [id number]     Height:       61.0 in Accession #:    [id number]    Weight:       120.6 lb Date of Birth:  Sep 03, 1980      BSA:          1.523 m Patient Age:    42 years      BP:           123/55 mmHg Patient Gender: F             HR:           70 bpm. Exam Location:  Inpatient Procedure: 2D  Echo, Cardiac Doppler and Color Doppler Indications:    Stroke I63.9  History:        Patient has prior history of Echocardiogram examinations, most                 recent 07/24/2022. CAD, PAD and Stroke; Risk                 Factors:Hypertension, Diabetes, Dyslipidemia and Non-Smoker.  Sonographer:    [name] RVT RCS Referring Phys: [id number] [name]  Sonographer Comments: Technically challenging due to wound, patient position and lack of cooperation. Patient appeared to be agitated. IMPRESSIONS  1. Left ventricular ejection fraction, by estimation, is 25 to 30%. The left ventricle has severely decreased function. The left ventricle demonstrates regional wall motion abnormalities (see scoring diagram/findings for description). There is moderate asymmetric left ventricular hypertrophy of the septal segment. Indeterminate diastolic filling due to E-A fusion.  2. Right ventricular systolic function is moderately reduced. The right ventricular size is mildly enlarged.  3. Catheter tip noted in the RA.  4. The mitral valve is degenerative. Mild to  moderate mitral valve regurgitation. No evidence of mitral stenosis.  5. Tricuspid valve regurgitation is moderate to severe.  6. Focal calcification of the NCC noted. The aortic valve is tricuspid. Aortic valve regurgitation is not visualized. Aortic valve sclerosis is present, with no evidence of aortic valve stenosis. Comparison(s): Changes from prior study are noted. The left ventricular function is significantly worse. LVEF now severely reduced with WMA. RV dysfunction now present. FINDINGS  Left Ventricle: Left ventricular ejection fraction, by estimation, is 25 to 30%. The left ventricle has severely decreased function. The left ventricle demonstrates regional wall motion abnormalities. The left ventricular internal cavity size was normal  in size. There is moderate asymmetric left ventricular hypertrophy of the septal segment. Indeterminate diastolic filling due to E-A fusion.  LV Wall Scoring: The apical lateral segment and mid anterolateral segment are akinetic. Right Ventricle: The right ventricular size is mildly enlarged. No increase in right ventricular wall thickness. Right ventricular systolic function is moderately reduced. Left Atrium: Left atrial size was normal in size. Right Atrium: Catheter tip noted in the RA. Right atrial size was normal in size. Pericardium: Trivial pericardial effusion is present. Mitral Valve: The mitral valve is degenerative in appearance. Mild to moderate mitral annular calcification. Mild to moderate mitral valve regurgitation. No evidence of mitral valve stenosis. Tricuspid Valve: The tricuspid valve is grossly normal. Tricuspid valve regurgitation is moderate to severe. No evidence of tricuspid stenosis. Aortic Valve: Focal calcification of the NCC noted. The aortic valve is tricuspid. Aortic valve regurgitation is not visualized. Aortic valve sclerosis is present, with no evidence of aortic valve stenosis. Aortic valve mean gradient measures 2.0 mmHg. Aortic valve  peak gradient measures 3.6 mmHg. Aortic valve area, by VTI measures 1.87 cm. Pulmonic Valve: The pulmonic valve was grossly normal. Pulmonic valve regurgitation is mild. No evidence of pulmonic stenosis. Aorta: The aortic root and ascending aorta are structurally normal, with no evidence of dilitation. Venous: The inferior vena cava was not well visualized. IAS/Shunts: The atrial septum is grossly normal.  LEFT VENTRICLE PLAX 2D LVIDd:         3.80 cm   Diastology LVIDs:         3.00 cm   LV e' medial:    4.40 cm/s LV PW:         1.10 cm   LV E/e' medial:  22.6 LV IVS:  1.50 cm   LV e' lateral:   11.00 cm/s LVOT diam:     1.80 cm   LV E/e' lateral: 9.0 LV SV:         27 LV SV Index:   18 LVOT Area:     2.54 cm  RIGHT VENTRICLE RV S prime:     8.52 cm/s TAPSE (M-mode): 0.8 cm LEFT ATRIUM             Index        RIGHT ATRIUM           Index LA diam:        3.20 cm 2.10 cm/m   RA Area:     10.60 cm LA Vol (A2C):   27.7 ml 18.18 ml/m  RA Volume:   23.00 ml  15.10 ml/m LA Vol (A4C):   32.3 ml 21.20 ml/m LA Biplane Vol: 32.4 ml 21.27 ml/m  AORTIC VALVE                    PULMONIC VALVE AV Area (Vmax):    2.02 cm     PV Vmax:          0.65 m/s AV Area (Vmean):   1.89 cm     PV Peak grad:     1.7 mmHg AV Area (VTI):     1.87 cm     PR End Diast Vel: 2.33 msec AV Vmax:           95.00 cm/s AV Vmean:          62.200 cm/s AV VTI:            0.144 m AV Peak Grad:      3.6 mmHg AV Mean Grad:      2.0 mmHg LVOT Vmax:         75.50 cm/s LVOT Vmean:        46.100 cm/s LVOT VTI:          0.106 m LVOT/AV VTI ratio: 0.74  AORTA Ao Root diam: 2.50 cm Ao Asc diam:  2.80 cm MITRAL VALVE               TRICUSPID VALVE MV Area (PHT): 6.83 cm    TR Peak grad:   21.0 mmHg MV Decel Time: 111 msec    TR Vmax:        229.00 cm/s MV E velocity: 99.30 cm/s MV A velocity: 32.80 cm/s  SHUNTS MV E/A ratio:  3.03        Systemic VTI:  0.11 m                            Systemic Diam: 1.80 cm Tracey Odor MD Electronically signed by  Tracey Odor MD Signature Date/Time: 05/29/2023/3:22:31 PM    Final    EEG adult  Result Date: 05/29/2023 Tracey Quest, MD     05/29/2023  5:18 PM Patient Name: [name] MRN: [id number] Epilepsy Attending: Charlsie Walker Referring Physician/Provider: Marvel Plan, MD Date: 05/29/2023 Duration: 25.11 mins Patient history: 42yo F with ams getting eeg to evaluate for seizure Level of alertness: comatose/ lethargic AEDs during EEG study: None Technical aspects: This EEG study was done with scalp electrodes positioned according to the 10-20 International system of electrode placement. Electrical activity was reviewed with band pass filter of 1-70Hz , sensitivity of 7 uV/mm, display speed of 90mm/sec with a 60Hz  notched filter applied  as appropriate. EEG data were recorded continuously and digitally stored.  Video monitoring was available and reviewed as appropriate. Description: EEG showed continuous generalized and maximal right fronto-parietal rhythmic 2-3hz  delta slowing. Hyperventilation and photic stimulation were not performed.   ABNORMALITY - Continuous slow, generalized and maximal right fronto-parietal IMPRESSION: This study is suggestive of cortical dysfunction arising from right fronto-parietal, likely secondary to underlying structural abnormality. Additionally there is severe diffuse encephalopathy. No seizures or epileptiform discharges were seen throughout the recording. [name]    Anti-infectives: Anti-infectives (From admission, onward)    Start     Dose/Rate Route Frequency Ordered Stop   05/29/23 1200  vancomycin (VANCOREADY) IVPB 500 mg/100 mL        500 mg 100 mL/hr over 60 Minutes Intravenous Every M-W-F (Hemodialysis) 05/28/23 0924 06/02/23 2359   05/28/23 1500  ceFEPIme (MAXIPIME) 2 g in sodium chloride 0.9 % 100 mL IVPB  Status:  Discontinued        2 g 200 mL/hr over 30 Minutes Intravenous Every 24 hours 05/28/23 0921 05/28/23 0922   05/28/23 1500  ceFEPIme  (MAXIPIME) 1 g in sodium chloride 0.9 % 100 mL IVPB        1 g 200 mL/hr over 30 Minutes Intravenous Every 24 hours 05/28/23 0922 06/02/23 2359   05/28/23 0600  metroNIDAZOLE (FLAGYL) IVPB 500 mg  Status:  Discontinued        500 mg 100 mL/hr over 60 Minutes Intravenous Every 12 hours 05/28/23 0104 05/29/23 0847   05/25/2023 1600  vancomycin (VANCOCIN) IVPB 1000 mg/200 mL premix        1,000 mg 200 mL/hr over 60 Minutes Intravenous  Once 06/11/2023 1508 06/06/2023 1933   06/04/2023 1515  ceFEPIme (MAXIPIME) 1 g in sodium chloride 0.9 % 100 mL IVPB        1 g 200 mL/hr over 30 Minutes Intravenous  Once 06/07/2023 1508 06/11/2023 1558   06/02/2023 1515  metroNIDAZOLE (FLAGYL) IVPB 500 mg        500 mg 100 mL/hr over 60 Minutes Intravenous  Once 05/14/2023 1508 05/23/2023 1933        Assessment/Walker Small bowel ischemia with SBO  POD#27 s/p diagnostic lap converted to ex-lap with extended ileo-cecectomy (mid-ileum to just past cecum), 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 Hillery Hunter Necrotic tissue at midline wound and around ileostomy - appreciate WOC assistance - Eakin's getting connected to suction to try and keep effluent off skin - concern for pseudomonas in midline - continue Dakin's through the weekend and will re-eval Monday  - document ileostomy output and will titrate antimotility agents accordingly  - surgery will see again Monday but we are available as needed over the weekend  - patient is unable to really participate in GOC discussions and no HCPOA or family has been able to be reached. I question at what point ethics needs to be involved for this patient with extremely poor quality of life.    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

## 2023-05-31 NOTE — Consult Note (Addendum)
WOC Nurse ostomy consult note Surgical team following for assessment and plan of care for abd wound. Stoma type/location: Refer to previous consult notes on 10/15.  Pt has a very difficult pouching situation and pouch is not adhering.  Stoma is 1 1/4 inches, red and viable, flush with skin level.  Large amt yellow liquid stool.  Skin surrounding stoma is 100% yellow slough, full thickness wound, approx 4X4cm.  Fluctuant with tan drainage.  Assessed appearance with surgical PA at the bedside.  Applied barrier ring and one piece small Eakin pouch and attached suction tube inside pouch to medium wall suction to attempt to maintain a seal.   Attached bottom spout to bedside drainage bag. 5 sets of each supply ordered to the bedside for staff nurses use.  Instructions provided for bedside nurses to perform PRN if pouch is leaking.  Cut out pattern to the back of the pouch: small Edwin Cap, [place] # [phone]) (taped to the wall) Stretch barrier ring [place] # [phone]) and apply to opening Pat area around stoma dry and apply pouch Cut a small slit in the top and insert the NG. DO NOT THROW NG AWAY EACH TIME.   Turn on medium wall suction. Cover slit with pink tape to close Attach bedside drainage bag to spout Thank-you,  Cammie Mcgee MSN, RN, [place], [place], CNS [phone]

## 2023-05-31 NOTE — Progress Notes (Signed)
[facility] NEPHROLOGY PROGRESS NOTE  Assessment/ Plan: Pt is a 42 y.o. yo female   Outpt HD: [facility] MWF  three times per week 180 NR, 400/1.5 auto, 2K/2Ca, EDW 49kg, mircera 75 q2wks   # Shock: presumed septic, on broad spectrum abx cefepime and vancomycin.  Off of vasopressor but on midodrine. Suspected related to abd wound.    # ESRD on HD:  MWF last tx 10/16, next today.  AVF worked ok on [employer].    # Anemia: Received blood transfusion.  Hemoglobin is stable.  Continue ESA and monitor CBC.    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  As above sepsis source thought to be wound -General Surgery is following.  #CKD-MBD: Secondary hyperPTH:  Ca was high and now improved after HD. Senispar on hold d/t GI issues. Not on VDRA.  Phos ok, changed binders from sevelamer d/t SBO to Fosrenol.    # Acute/subacute CVA: MRI confirmed poss infarcts, CTA has been obtained. EEG no seizure.  Per neurology.  # Hyponatremia, hypervolemic: Managed with dialysis, UF as tolerated.  Subjective: Seen and examined in ICU.  Patient was somnolent.  Not on pressors.  No new event.  Plan for dialysis today. Objective Vital signs in last 24 hours: Vitals:   05/31/23 0730 05/31/23 0800 05/31/23 0830 05/31/23 0900  BP: 134/70 (!) 142/78 (!) 140/55 (!) 149/76  Pulse: 81 78 83 79  Resp: (!) 6 (!) 9 11 12   Temp:      TempSrc:      SpO2: 100% 100% 100% 100%  Weight:       Weight change: -2.6 kg  Intake/Output Summary (Last 24 hours) at 05/31/2023 1037 Last data filed at 05/31/2023 0900 Gross per 24 hour  Intake 820 ml  Output 910 ml  Net -90 ml       Labs: RENAL PANEL Recent Labs  Lab 05/25/23 1159 05/21/2023 1510 05/16/2023 1511 06/13/2023 1514 05/28/23 0403 05/29/23 0505 05/29/23 0733 05/30/23 0426  NA 134* 137   < > 137 129* 128* 128* 129*  K 4.5 4.0   < > 3.9 3.1* 5.4* 5.2* 3.9  CL 91* 94*  --  95* 94*  --  97* 93*  CO2 20* 24  --   --  19*  --  18* 22   GLUCOSE 282* 111*  --  113* 308*  --  130* 151*  BUN 18 19  --  20 21*  --  28* 14  CREATININE 3.98* 3.48*  --  3.70* 3.78*  --  4.15* 2.76*  CALCIUM 11.7* 10.0  --   --  10.1  --  10.8* 9.9  MG  --  1.7  --   --  1.5*  --   --   --   PHOS  --   --   --   --  4.0  --  5.8* 3.9  ALBUMIN 2.8* 2.4*  --   --   --   --  1.9* 2.0*   < > = values in this interval not displayed.    Liver Function Tests: Recent Labs  Lab 05/25/23 1159 06/07/2023 1510 05/29/23 0733 05/30/23 0426  AST 14* 59*  --   --   ALT 13 25  --   --   ALKPHOS 101 124  --   --   BILITOT 1.0 1.0  --   --   PROT 8.4* 7.5  --   --   ALBUMIN  2.8* 2.4* 1.9* 2.0*   Recent Labs  Lab 05/25/23 1159  LIPASE 55*   No results for input(s): "AMMONIA" in the last 168 hours. CBC: Recent Labs    07/25/22 0532 07/26/22 0654 05/20/23 1844 05/21/23 0356 05/28/23 0403 05/29/23 0505 05/29/23 0733 05/29/23 1610 05/30/23 0426  HGB 9.8*   < >  --    < > 7.1* 7.8* 6.6* 8.9* 9.1*  MCV 85.3   < >  --    < > 88.4  --  86.6  --  87.9  FERRITIN 562*  --   --   --   --   --   --   --   --   TIBC 179*  --  111*  --   --   --   --   --   --   IRON 56  --  68  --   --   --   --   --   --    < > = values in this interval not displayed.    Cardiac Enzymes: No results for input(s): "CKTOTAL", "CKMB", "CKMBINDEX", "TROPONINI" in the last 168 hours. CBG: Recent Labs  Lab 05/30/23 1124 05/30/23 1527 05/30/23 1740 05/30/23 2136 05/31/23 0726  GLUCAP 76 190* 196* 176* 234*    Iron Studies: No results for input(s): "IRON", "TIBC", "TRANSFERRIN", "FERRITIN" in the last 72 hours. Studies/Results: ECHOCARDIOGRAM COMPLETE  Result Date: 05/29/2023    ECHOCARDIOGRAM REPORT   Patient Name:   TYONA SANTIAGO Date of Exam: 05/29/2023 Medical Rec #:  [id number]     Height:       61.0 in Accession #:    [id number]    Weight:       120.6 lb Date of Birth:  Jan 14, 1981      BSA:          1.523 m Patient Age:    42 years      BP:           123/55  mmHg Patient Gender: F             HR:           70 bpm. Exam Location:  Inpatient Procedure: 2D Echo, Cardiac Doppler and Color Doppler Indications:    Stroke I63.9  History:        Patient has prior history of Echocardiogram examinations, most                 recent 07/24/2022. CAD, PAD and Stroke; Risk                 Factors:Hypertension, Diabetes, Dyslipidemia and Non-Smoker.  Sonographer:    Dondra Prader RVT RCS Referring Phys: [id number] ASHISH ARORA  Sonographer Comments: Technically challenging due to wound, patient position and lack of cooperation. Patient appeared to be agitated. IMPRESSIONS  1. Left ventricular ejection fraction, by estimation, is 25 to 30%. The left ventricle has severely decreased function. The left ventricle demonstrates regional wall motion abnormalities (see scoring diagram/findings for description). There is moderate asymmetric left ventricular hypertrophy of the septal segment. Indeterminate diastolic filling due to E-A fusion.  2. Right ventricular systolic function is moderately reduced. The right ventricular size is mildly enlarged.  3. Catheter tip noted in the RA.  4. The mitral valve is degenerative. Mild to moderate mitral valve regurgitation. No evidence of mitral stenosis.  5. Tricuspid valve regurgitation is moderate to severe.  6. Focal calcification of the NCC noted. The aortic valve  is tricuspid. Aortic valve regurgitation is not visualized. Aortic valve sclerosis is present, with no evidence of aortic valve stenosis. Comparison(s): Changes from prior study are noted. The left ventricular function is significantly worse. LVEF now severely reduced with WMA. RV dysfunction now present. FINDINGS  Left Ventricle: Left ventricular ejection fraction, by estimation, is 25 to 30%. The left ventricle has severely decreased function. The left ventricle demonstrates regional wall motion abnormalities. The left ventricular internal cavity size was normal  in size. There is moderate  asymmetric left ventricular hypertrophy of the septal segment. Indeterminate diastolic filling due to E-A fusion.  LV Wall Scoring: The apical lateral segment and mid anterolateral segment are akinetic. Right Ventricle: The right ventricular size is mildly enlarged. No increase in right ventricular wall thickness. Right ventricular systolic function is moderately reduced. Left Atrium: Left atrial size was normal in size. Right Atrium: Catheter tip noted in the RA. Right atrial size was normal in size. Pericardium: Trivial pericardial effusion is present. Mitral Valve: The mitral valve is degenerative in appearance. Mild to moderate mitral annular calcification. Mild to moderate mitral valve regurgitation. No evidence of mitral valve stenosis. Tricuspid Valve: The tricuspid valve is grossly normal. Tricuspid valve regurgitation is moderate to severe. No evidence of tricuspid stenosis. Aortic Valve: Focal calcification of the NCC noted. The aortic valve is tricuspid. Aortic valve regurgitation is not visualized. Aortic valve sclerosis is present, with no evidence of aortic valve stenosis. Aortic valve mean gradient measures 2.0 mmHg. Aortic valve peak gradient measures 3.6 mmHg. Aortic valve area, by VTI measures 1.87 cm. Pulmonic Valve: The pulmonic valve was grossly normal. Pulmonic valve regurgitation is mild. No evidence of pulmonic stenosis. Aorta: The aortic root and ascending aorta are structurally normal, with no evidence of dilitation. Venous: The inferior vena cava was not well visualized. IAS/Shunts: The atrial septum is grossly normal.  LEFT VENTRICLE PLAX 2D LVIDd:         3.80 cm   Diastology LVIDs:         3.00 cm   LV e' medial:    4.40 cm/s LV PW:         1.10 cm   LV E/e' medial:  22.6 LV IVS:        1.50 cm   LV e' lateral:   11.00 cm/s LVOT diam:     1.80 cm   LV E/e' lateral: 9.0 LV SV:         27 LV SV Index:   18 LVOT Area:     2.54 cm  RIGHT VENTRICLE RV S prime:     8.52 cm/s TAPSE (M-mode):  0.8 cm LEFT ATRIUM             Index        RIGHT ATRIUM           Index LA diam:        3.20 cm 2.10 cm/m   RA Area:     10.60 cm LA Vol (A2C):   27.7 ml 18.18 ml/m  RA Volume:   23.00 ml  15.10 ml/m LA Vol (A4C):   32.3 ml 21.20 ml/m LA Biplane Vol: 32.4 ml 21.27 ml/m  AORTIC VALVE                    PULMONIC VALVE AV Area (Vmax):    2.02 cm     PV Vmax:          0.65 m/s AV Area (Vmean):  1.89 cm     PV Peak grad:     1.7 mmHg AV Area (VTI):     1.87 cm     PR End Diast Vel: 2.33 msec AV Vmax:           95.00 cm/s AV Vmean:          62.200 cm/s AV VTI:            0.144 m AV Peak Grad:      3.6 mmHg AV Mean Grad:      2.0 mmHg LVOT Vmax:         75.50 cm/s LVOT Vmean:        46.100 cm/s LVOT VTI:          0.106 m LVOT/AV VTI ratio: 0.74  AORTA Ao Root diam: 2.50 cm Ao Asc diam:  2.80 cm MITRAL VALVE               TRICUSPID VALVE MV Area (PHT): 6.83 cm    TR Peak grad:   21.0 mmHg MV Decel Time: 111 msec    TR Vmax:        229.00 cm/s MV E velocity: 99.30 cm/s MV A velocity: 32.80 cm/s  SHUNTS MV E/A ratio:  3.03        Systemic VTI:  0.11 m                            Systemic Diam: 1.80 cm Lennie Odor MD Electronically signed by Lennie Odor MD Signature Date/Time: 05/29/2023/3:22:31 PM    Final    EEG adult  Result Date: 05/29/2023 Charlsie Quest, MD     05/29/2023  5:18 PM Patient Name: [name] MRN: [id number] Epilepsy Attending: Charlsie Quest Referring Physician/Provider: Marvel Plan, MD Date: 05/29/2023 Duration: 25.11 mins Patient history: 42yo F with ams getting eeg to evaluate for seizure Level of alertness: comatose/ lethargic AEDs during EEG study: None Technical aspects: This EEG study was done with scalp electrodes positioned according to the 10-20 International system of electrode placement. Electrical activity was reviewed with band pass filter of 1-70Hz , sensitivity of 7 uV/mm, display speed of 65mm/sec with a 60Hz  notched filter applied as appropriate. EEG data were  recorded continuously and digitally stored.  Video monitoring was available and reviewed as appropriate. Description: EEG showed continuous generalized and maximal right fronto-parietal rhythmic 2-3hz  delta slowing. Hyperventilation and photic stimulation were not performed.   ABNORMALITY - Continuous slow, generalized and maximal right fronto-parietal IMPRESSION: This study is suggestive of cortical dysfunction arising from right fronto-parietal, likely secondary to underlying structural abnormality. Additionally there is severe diffuse encephalopathy. No seizures or epileptiform discharges were seen throughout the recording. Priyanka Annabelle Harman    Medications: Infusions:  anticoagulant sodium citrate     ceFEPime (MAXIPIME) IV Stopped (05/30/23 1649)   vancomycin Stopped (05/30/23 0248)    Scheduled Medications:  atorvastatin  20 mg Oral Daily   Chlorhexidine Gluconate Cloth  6 each Topical Daily   Chlorhexidine Gluconate Cloth  6 each Topical Q0600   darbepoetin (ARANESP) injection - DIALYSIS  60 mcg Subcutaneous Q Fri-1800   dronabinol  5 mg Oral BID AC   heparin  5,000 Units Subcutaneous Q8H   insulin aspart  0-9 Units Subcutaneous TID WC   LORazepam  1 mg Intravenous Once   midodrine  10 mg Oral TID WC   sodium hypochlorite   Irrigation BID    have  reviewed scheduled and prn medications.  Physical Exam: General: Chronically ill looking, somnolent. Heart:RRR, s1s2 nl Lungs:clear b/l, no crackle Abdomen: Dressing applied, not distended Extremities:No edema Dialysis Access: Right upper extremity AV fistula has thrill and bruit.  Nature Kueker Prasad Mirta Mally 05/31/2023,10:37 AM  LOS: 4 days

## 2023-05-31 NOTE — Progress Notes (Signed)
Pharmacy Antibiotic Note  [name] is a 42 y.o. female admitted on 05/27/2023 with sepsis.  Pharmacy has been consulted for cefepime and vancomycin dosing. Pt is ESRD on HD - continuing on PTA schedule (MWF).   Unable to get peripheral stick x1 . Awaiting second attempt via phleb   Pre-HD vancomycin level: 21 mcg/mL, therapeutic    Plan: Continue vancomycin 500 mg IV qMWF  goal trough 15-55mcg/mL per protocol for pre-HD level Cefepime 1g IV q24h --7 day stop dates entered for 10/20.  F/u iHD plans, culture data, CBC    Weight: 51 kg (112 lb 7 oz)  Temp (24hrs), Avg:98.1 F (36.7 C), Min:97.6 F (36.4 C), Max:99.2 F (37.3 C)  Recent Labs  Lab 05/25/23 1159 05/25/23 1204 05/25/23 1510 05/27/23 1510 05/27/23 1513 05/27/23 1514 05/28/23 0403 05/28/23 0920 05/29/23 0733 05/30/23 0426  WBC 12.6*  --   --  12.5*  --   --  27.9*  --  16.6* 16.1*  CREATININE 3.98*  --   --  3.48*  --  3.70* 3.78*  --  4.15* 2.76*  LATICACIDVEN  --  2.8* 1.2  --  2.4*  --  3.5* 2.2*  --   --     Estimated Creatinine Clearance: 20.2 mL/min (A) (by C-G formula based on SCr of 2.76 mg/dL (H)).    Allergies  Allergen Reactions   Morphine Rash and Other (See Comments)    Per patient MAR.   Peanut-Containing Drug Products Anaphylaxis and Hives   Penicillins Rash and Other (See Comments)    Rash in 2008.  Tolerated cefazolin in 2020. No reaction listed on MAR.   Shellfish Allergy Swelling   Chocolate Hives    Antimicrobials this admission: -dakins solution and wound care per surg  Cefepime 10/14>(10/20) Vanc 10/14>(10/20) Flagyl 10/14> 10/16  Dose adjustments this admission:   Microbiology results: 10/14 MRSA neg  10/12 BCX: ng x5 10/14 BCX:ng x3  Thank you for allowing pharmacy to be a part of this patient's care.  Calton Dach, PharmD, BCCCP Clinical Pharmacist 05/31/2023 8:05 AM

## 2023-05-31 NOTE — Plan of Care (Signed)
  Problem: Education: Goal: Knowledge of the prescribed therapeutic regimen will improve Outcome: Not Progressing   Problem: Activity: Goal: Ability to perform//tolerate increased activity and mobilize with assistive devices will improve Outcome: Not Progressing   Problem: Self-Care: Goal: Ability to meet self-care needs will improve Outcome: Not Progressing   Problem: Pain Management: Goal: Pain level will decrease with appropriate interventions Outcome: Not Progressing

## 2023-05-31 NOTE — Plan of Care (Signed)
[facility] Tonsillectomy/Adenoidectomy, Postoperative PEDS care plan entered in error.

## 2023-06-01 DIAGNOSIS — A419 Sepsis, unspecified organism: Secondary | ICD-10-CM | POA: Diagnosis not present

## 2023-06-01 DIAGNOSIS — R6521 Severe sepsis with septic shock: Secondary | ICD-10-CM | POA: Diagnosis not present

## 2023-06-01 LAB — CULTURE, BLOOD (ROUTINE X 2)
Culture: NO GROWTH
Culture: NO GROWTH
Special Requests: ADEQUATE
Special Requests: ADEQUATE

## 2023-06-01 LAB — GLUCOSE, CAPILLARY
Glucose-Capillary: 105 mg/dL — ABNORMAL HIGH (ref 70–99)
Glucose-Capillary: 181 mg/dL — ABNORMAL HIGH (ref 70–99)
Glucose-Capillary: 109 mg/dL — ABNORMAL HIGH (ref 70–99)
Glucose-Capillary: 83 mg/dL (ref 70–99)

## 2023-06-01 NOTE — Progress Notes (Signed)
[facility] Progress Note   Subjective:   Patient seen and examined at bedside.  States she tolerated dialysis yesterday.  Report her back is cold.  No other complaints.  Denies CP, SOB, and n/v/d.   Objective Vitals:   06/01/23 0447 06/01/23 0458 06/01/23 0500 06/01/23 0519  BP: (!) 146/70 (!) 94/30  105/86  Pulse: 87 83  86  Resp: 14 11  14   Temp:    98.4 F (36.9 C)  TempSrc:    Oral  SpO2: 97% 99%  99%  Weight:   46.6 kg    Physical Exam General:chronically ill appearing female in NAD Heart:RRR, no mrg Lungs:CTAB, nml WOB on RA Abdomen:non distended Extremities:b/l AKA, no edema Dialysis Access: RU AVF +b/t   Filed Weights   05/30/23 0500 05/31/23 0500 06/01/23 0500  Weight: 53.6 kg 51 kg 46.6 kg    Intake/Output Summary (Last 24 hours) at 06/01/2023 0826 Last data filed at 06/01/2023 0419 Gross per 24 hour  Intake 240 ml  Output 520 ml  Net -280 ml    Additional Objective Labs: Basic Metabolic Panel: Recent Labs  Lab 05/28/23 0403 05/29/23 0505 05/29/23 0733 05/30/23 0426  NA 129* 128* 128* 129*  K 3.1* 5.4* 5.2* 3.9  CL 94*  --  97* 93*  CO2 19*  --  18* 22  GLUCOSE 308*  --  130* 151*  BUN 21*  --  28* 14  CREATININE 3.78*  --  4.15* 2.76*  CALCIUM 10.1  --  10.8* 9.9  PHOS 4.0  --  5.8* 3.9   Liver Function Tests: Recent Labs  Lab 05/25/23 1159 05/27/23 1510 05/29/23 0733 05/30/23 0426  AST 14* 59*  --   --   ALT 13 25  --   --   ALKPHOS 101 124  --   --   BILITOT 1.0 1.0  --   --   PROT 8.4* 7.5  --   --   ALBUMIN 2.8* 2.4* 1.9* 2.0*   Recent Labs  Lab 05/25/23 1159  LIPASE 55*   CBC: Recent Labs  Lab 05/25/23 1159 05/27/23 1510 05/27/23 1511 05/28/23 0403 05/29/23 0505 05/29/23 0733 05/29/23 1610 05/30/23 0426  WBC 12.6* 12.5*  --  27.9*  --  16.6*  --  16.1*  NEUTROABS 10.3* 10.6*  --   --   --   --   --   --   HGB 8.5* 7.5*   < > 7.1*   < > 6.6* 8.9* 9.1*  HCT 30.2* 25.3*   < > 23.7*   < > 21.4*  27.2* 27.0*  MCV 91.5 88.8  --  88.4  --  86.6  --  87.9  PLT 349 316  --  416*  --  308  --  231   < > = values in this interval not displayed.   Blood Culture    Component Value Date/Time   SDES BLOOD LEFT ARM 05/27/2023 1527   SPECREQUEST  05/27/2023 1527    BOTTLES DRAWN AEROBIC AND ANAEROBIC Blood Culture adequate volume   CULT  05/27/2023 1527    NO GROWTH 5 DAYS Performed at [facility], [address], [place], [place] [address]    REPTSTATUS 06/01/2023 FINAL 05/27/2023 1527     Medications:  ceFEPime (MAXIPIME) IV 1 g (05/31/23 1637)   vancomycin      atorvastatin  20 mg Oral Daily   Chlorhexidine Gluconate Cloth  6 each Topical Daily  Chlorhexidine Gluconate Cloth  6 each Topical Q0600   darbepoetin (ARANESP) injection - DIALYSIS  60 mcg Subcutaneous Q Fri-1800   dronabinol  5 mg Oral BID AC   heparin  5,000 Units Subcutaneous Q8H   insulin aspart  0-9 Units Subcutaneous TID WC   LORazepam  1 mg Intravenous Once   midodrine  10 mg Oral TID WC   sodium hypochlorite   Irrigation BID    Dialysis Orders: [facility] MWF 80 NR, 400/1.5 auto, 2K/2Ca, EDW 49kg, mircera 75 q2wks   Assessment/Plan: 1. Shock: presumed septic, on broad spectrum abx cefepime and vancomycin.  Off of vasopressor but on midodrine. Suspected related to abd wound.  2. 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 Necrotic tissue noted in wound and around ileostomy.  WC and surgery following.   3. 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  CTA completed. EEG with no seizure like activity. Per Neuro.  4. ESRD - MWF. Next HD on 10/22 5. Anemia of CKD- Hgb up to 9.1. s/p 1 unit pRBC. Continue ESA. 6. Secondary hyperparathyroidism - Calcium and phos in goal. Senispar on hold d/t GI issues.  Not on VDRA. Continue Fosrenol.  7. HTN/volume - Hypotensive, on midodrine. Volume status improved.  UF as tolerated. 8. Nutrition - Alb low. No regular diet.  Monitor labs.   Virgina Norfolk, PA-C [facility] 06/01/2023,8:26 AM  LOS: 5 days

## 2023-06-01 NOTE — Progress Notes (Signed)
PROGRESS NOTE    [name]  VHQ:[id number] DOB: [birth year] DOA: 06/11/2023 PCP: [facility]   Brief Narrative:  [name] is a 42 year old woman with insulin-dependent diabetes mellitus, resultant end-stage renal disease, bilateral AKA, blindness and severe peripheral arterial disease.  She presents hypotensive from dialysis today.  She had a recent prolonged hospital stay in September 2024 for small bowel ischemia with subsequent small bowel obstruction.  She had an extended ileal cecotomy. Ultimately discharged 3 days prior to admission to SNF for ongoing care/therapy.  Of note her AV fistula was unable to be accessed due to pseudoaneurysm and repeated clotting at the time of admission.  She thus has a dialysis catheter placed in the right chest.  There is report that she was not adherent to dressing changes for her open abdominal wound, and did not agree to wound VAC.  She was found to be hypotensive in the ED and started on 2 vasopressors.  PCCM consulted for admission.  06/10/2023 admitted to the hospital with hypotension after dialysis concern for septic shock 05/29/2023 tolerated HD via fistula, weaning off pressors 10/18 - transferred to [facility] service - remains hypotensive - fluid challenge ongoing - HD planned this afternoon  Assessment & Plan:   Principal Problem:   Septic shock (HCC)  Septic Shock secondary to suspected wound/skin/soft tissue infection, POA Acute metabolic encephalopathy -Continue Vancomycin cefepime plan 7 days, flagyl d/c'd 10/16   -Blood pressure remains labile, questionable noncompliance with automated blood pressure readings -blood pressure ranging 162/94 to 32/20 without any symptoms -patient educated on need for compliance while taking vitals -Stress dose steroids completed -Continue midodrine - patient previously noncompliant with his medication likely exacerbating her hypotension has agreed to take last 3 doses with  moderate improvement in blood pressure - Appreciate surgery assistance - Palliative care consult with severe co morbid chronic organ failure and refusing or fighting wound care/medication administration/general care -she remains high risk for decompensation given her profound noncompliance and nonadherence to medication, physical exam, and treatment.  She remains full code.   End-stage renal disease, likely diabetic nephropathy -Temporary HD catheter removed -Appreciate nephrology assistance -Continue dialysis per nephrology -Monday Wednesday Friday   Type 2 diabetes Severe peripheral or vascular disease Recent small bowel ischemia Bilateral AKA -Semglee, sliding scale insulin   Punctate acute infarct R frontal love, acute to subacute infarct right parieto occipital infarct: --appreciate neurology assistance, stroke team to follow -- EEG without seizures, dysfunction likely at site of stroke  DVT prophylaxis: heparin injection 5,000 Units Start: 06/08/2023 2200 Code Status:   Code Status: Full Code Family Communication: None present  Status is: Inpatient Dispo: The patient is from: Facility              Anticipated d/c is to: Same              Anticipated d/c date is: 48 to 72 hours pending clinical course              Patient currently not medically stable for discharge  Consultants:  General surgery, PCCM, nephrology  Antimicrobials:  Cefepime, vancomycin  Subjective: No acute issues or events reported overnight -patient continues to be noncompliant with evaluation and interview today "I'm good, just leave me alone"  Objective: Vitals:   06/01/23 0447 06/01/23 0458 06/01/23 0500 06/01/23 0519  BP: (!) 146/70 (!) 94/30  105/86  Pulse: 87 83  86  Resp: 14 11  14   Temp:    98.4 F (  36.9 C)  TempSrc:    Oral  SpO2: 97% 99%  99%  Weight:   46.6 kg     Intake/Output Summary (Last 24 hours) at 06/01/2023 0801 Last data filed at 06/01/2023 0419 Gross per 24 hour  Intake  240 ml  Output 520 ml  Net -280 ml   Filed Weights   05/30/23 0500 05/31/23 0500 06/01/23 0500  Weight: 53.6 kg 51 kg 46.6 kg    Examination:  General:  Pleasantly resting in bed, No acute distress. HEENT:  Normocephalic atraumatic.  Sclerae nonicteric, noninjected.  Extraocular movements intact bilaterally. Neck:  Without mass or deformity.  Trachea is midline. Lungs: Unable to evaluate per patient refusal Heart:  Unable to evaluate per patient refusal Abdomen:  Unable to evaluate per patient refusal Extremities: Unable to evaluate per patient refusal - Bilateral AKA noted Skin: Extremely limited exam given patient refusal, no obvious signs or symptoms of bleeding or skin breakdown over distal upper extremities  Data Reviewed: I have personally reviewed following labs and imaging studies  CBC: Recent Labs  Lab 05/25/23 1159 06/05/2023 1510 05/28/2023 1511 05/28/23 0403 05/29/23 0505 05/29/23 0733 05/29/23 1610 05/30/23 0426  WBC 12.6* 12.5*  --  27.9*  --  16.6*  --  16.1*  NEUTROABS 10.3* 10.6*  --   --   --   --   --   --   HGB 8.5* 7.5*   < > 7.1* 7.8* 6.6* 8.9* 9.1*  HCT 30.2* 25.3*   < > 23.7* 23.0* 21.4* 27.2* 27.0*  MCV 91.5 88.8  --  88.4  --  86.6  --  87.9  PLT 349 316  --  416*  --  308  --  231   < > = values in this interval not displayed.   Basic Metabolic Panel: Recent Labs  Lab 05/25/23 1159 06/01/2023 1510 06/13/2023 1511 05/16/2023 1514 05/28/23 0403 05/29/23 0505 05/29/23 0733 05/30/23 0426  NA 134* 137   < > 137 129* 128* 128* 129*  K 4.5 4.0   < > 3.9 3.1* 5.4* 5.2* 3.9  CL 91* 94*  --  95* 94*  --  97* 93*  CO2 20* 24  --   --  19*  --  18* 22  GLUCOSE 282* 111*  --  113* 308*  --  130* 151*  BUN 18 19  --  20 21*  --  28* 14  CREATININE 3.98* 3.48*  --  3.70* 3.78*  --  4.15* 2.76*  CALCIUM 11.7* 10.0  --   --  10.1  --  10.8* 9.9  MG  --  1.7  --   --  1.5*  --   --   --   PHOS  --   --   --   --  4.0  --  5.8* 3.9   < > = values in this  interval not displayed.   GFR: Estimated Creatinine Clearance: 19.7 mL/min (A) (by C-G formula based on SCr of 2.76 mg/dL (H)). Liver Function Tests: Recent Labs  Lab 05/25/23 1159 06/01/2023 1510 05/29/23 0733 05/30/23 0426  AST 14* 59*  --   --   ALT 13 25  --   --   ALKPHOS 101 124  --   --   BILITOT 1.0 1.0  --   --   PROT 8.4* 7.5  --   --   ALBUMIN 2.8* 2.4* 1.9* 2.0*   Recent Labs  Lab 05/25/23 1159  LIPASE 55*   CBG: Recent Labs  Lab 05/30/23 2136 05/31/23 0726 05/31/23 1118 05/31/23 1541 05/31/23 2106  GLUCAP 176* 234* 174* 191* 139*    Sepsis Labs: Recent Labs  Lab 05/25/23 1510 05/14/2023 1513 05/28/23 0403 05/28/23 0920  LATICACIDVEN 1.2 2.4* 3.5* 2.2*    Recent Results (from the past 240 hour(s))  Blood culture (routine x 2)     Status: None   Collection Time: 05/25/23 11:04 AM   Specimen: BLOOD  Result Value Ref Range Status   Specimen Description BLOOD LEFT ANTECUBITAL  Final   Special Requests   Final    BOTTLES DRAWN AEROBIC ONLY Blood Culture results may not be optimal due to an inadequate volume of blood received in culture bottles   Culture   Final    NO GROWTH 5 DAYS Performed at [facility], [address] [place], [place] [address]    Report Status 05/30/2023 FINAL  Final  Blood culture (routine x 2)     Status: None   Collection Time: 05/25/23 11:09 AM   Specimen: BLOOD LEFT HAND  Result Value Ref Range Status   Specimen Description BLOOD LEFT HAND  Final   Special Requests   Final    BOTTLES DRAWN AEROBIC ONLY Blood Culture results may not be optimal due to an inadequate volume of blood received in culture bottles   Culture   Final    NO GROWTH 5 DAYS Performed at [facility], [address], [place], [place] [address]    Report Status 05/30/2023 FINAL  Final  Blood culture (routine x 2)     Status: None   Collection Time: 05/16/2023  3:10 PM   Specimen: BLOOD LEFT ARM  Result Value Ref Range Status   Specimen  Description BLOOD LEFT ARM  Final   Special Requests   Final    BOTTLES DRAWN AEROBIC AND ANAEROBIC Blood Culture adequate volume   Culture   Final    NO GROWTH 5 DAYS Performed at [facility], [address] [place], [place] [address]    Report Status 06/01/2023 FINAL  Final  Blood culture (routine x 2)     Status: None   Collection Time: 05/29/2023  3:27 PM   Specimen: BLOOD LEFT ARM  Result Value Ref Range Status   Specimen Description BLOOD LEFT ARM  Final   Special Requests   Final    BOTTLES DRAWN AEROBIC AND ANAEROBIC Blood Culture adequate volume   Culture   Final    NO GROWTH 5 DAYS Performed at [facility], [address], [place], [place] [address]    Report Status 06/01/2023 FINAL  Final  MRSA Next Gen by PCR, Nasal     Status: None   Collection Time: 06/04/2023  9:21 PM   Specimen: Nasal Mucosa; Nasal Swab  Result Value Ref Range Status   MRSA by PCR Next Gen NOT DETECTED NOT DETECTED Final    Comment: (NOTE) The GeneXpert MRSA Assay (FDA approved for NASAL specimens only), is one component of a comprehensive MRSA colonization surveillance program. It is not intended to diagnose MRSA infection nor to guide or monitor treatment for MRSA infections. Test performance is not FDA approved in patients less than 59 years old. Performed at [facility], [address] [place], [place] [address]          Radiology Studies: No results found.      Scheduled Meds:  atorvastatin  20 mg Oral Daily   Chlorhexidine Gluconate Cloth  6 each Topical Daily   Chlorhexidine Gluconate Cloth  6 each Topical Q0600   darbepoetin (ARANESP) injection - DIALYSIS  60 mcg Subcutaneous Q Fri-1800   dronabinol  5 mg Oral BID AC   heparin  5,000 Units Subcutaneous Q8H   insulin aspart  0-9 Units Subcutaneous TID WC   LORazepam  1 mg Intravenous Once   midodrine  10 mg Oral TID WC   sodium hypochlorite   Irrigation BID   Continuous Infusions:  ceFEPime  (MAXIPIME) IV 1 g (05/31/23 1637)   vancomycin       LOS: 5 days   Time spent:  Azucena Fallen, DO [facility]  If 7PM-7AM, please contact night-coverage [email or web address]  06/01/2023, 8:01 AM

## 2023-06-01 NOTE — Progress Notes (Signed)
Pt husband called to check on pt and speak to pt. Updated pt emergency contact information with new phone number.

## 2023-06-01 NOTE — Plan of Care (Signed)
  Problem: Clinical Measurements: Goal: Postoperative complications will be avoided or minimized Outcome: Progressing   Problem: Pain Management: Goal: Pain level will decrease with appropriate interventions Outcome: Progressing   Problem: Skin Integrity: Goal: Demonstration of wound healing without infection will improve Outcome: Progressing   Problem: Activity: Goal: Risk for activity intolerance will decrease Outcome: Progressing   Problem: Nutrition: Goal: Adequate nutrition will be maintained Outcome: Progressing   Problem: Coping: Goal: Level of anxiety will decrease Outcome: Progressing   Problem: Elimination: Goal: Will not experience complications related to bowel motility Outcome: Progressing   Problem: Pain Managment: Goal: General experience of comfort will improve Outcome: Progressing   Problem: Safety: Goal: Ability to remain free from injury will improve Outcome: Progressing   Problem: Skin Integrity: Goal: Risk for impaired skin integrity will decrease Outcome: Progressing

## 2023-06-01 NOTE — Plan of Care (Signed)
  Problem: Education: Goal: Knowledge of the prescribed therapeutic regimen will improve Outcome: Progressing Goal: Ability to verbalize activity precautions or restrictions will improve Outcome: Progressing Goal: Understanding of discharge needs will improve Outcome: Progressing   Problem: Activity: Goal: Ability to perform//tolerate increased activity and mobilize with assistive devices will improve Outcome: Progressing   Problem: Clinical Measurements: Goal: Postoperative complications will be avoided or minimized Outcome: Progressing   Problem: Self-Care: Goal: Ability to meet self-care needs will improve Outcome: Progressing   Problem: Self-Concept: Goal: Ability to maintain and perform role responsibilities to the fullest extent possible will improve Outcome: Progressing   Problem: Pain Management: Goal: Pain level will decrease with appropriate interventions Outcome: Progressing   Problem: Skin Integrity: Goal: Demonstration of wound healing without infection will improve Outcome: Progressing   Problem: Activity: Goal: Risk for activity intolerance will decrease Outcome: Progressing   Problem: Nutrition: Goal: Adequate nutrition will be maintained Outcome: Progressing   Problem: Coping: Goal: Level of anxiety will decrease Outcome: Progressing   Problem: Elimination: Goal: Will not experience complications related to bowel motility Outcome: Progressing Goal: Will not experience complications related to urinary retention Outcome: Progressing   Problem: Pain Managment: Goal: General experience of comfort will improve Outcome: Progressing   Problem: Safety: Goal: Ability to remain free from injury will improve Outcome: Progressing   Problem: Skin Integrity: Goal: Risk for impaired skin integrity will decrease Outcome: Progressing   Problem: Education: Goal: Ability to describe self-care measures that may prevent or decrease complications (Diabetes  Survival Skills Education) will improve Outcome: Progressing Goal: Individualized Educational Video(s) Outcome: Progressing   Problem: Coping: Goal: Ability to adjust to condition or change in health will improve Outcome: Progressing   Problem: Fluid Volume: Goal: Ability to maintain a balanced intake and output will improve Outcome: Progressing   Problem: Health Behavior/Discharge Planning: Goal: Ability to identify and utilize available resources and services will improve Outcome: Progressing Goal: Ability to manage health-related needs will improve Outcome: Progressing   Problem: Metabolic: Goal: Ability to maintain appropriate glucose levels will improve Outcome: Progressing   Problem: Nutritional: Goal: Maintenance of adequate nutrition will improve Outcome: Progressing Goal: Progress toward achieving an optimal weight will improve Outcome: Progressing   Problem: Skin Integrity: Goal: Risk for impaired skin integrity will decrease Outcome: Progressing   Problem: Tissue Perfusion: Goal: Adequacy of tissue perfusion will improve Outcome: Progressing   Problem: Education: Goal: Knowledge of disease or condition will improve Outcome: Progressing Goal: Knowledge of secondary prevention will improve (MUST DOCUMENT ALL) Outcome: Progressing Goal: Knowledge of patient specific risk factors will improve [name] N/A or DELETE if not current risk factor) Outcome: Progressing   Problem: Ischemic Stroke/TIA Tissue Perfusion: Goal: Complications of ischemic stroke/TIA will be minimized Outcome: Progressing   Problem: Coping: Goal: Will verbalize positive feelings about self Outcome: Progressing Goal: Will identify appropriate support needs Outcome: Progressing   Problem: Health Behavior/Discharge Planning: Goal: Ability to manage health-related needs will improve Outcome: Progressing Goal: Goals will be collaboratively established with patient/family Outcome:  Progressing   Problem: Self-Care: Goal: Ability to participate in self-care as condition permits will improve Outcome: Progressing Goal: Verbalization of feelings and concerns over difficulty with self-care will improve Outcome: Progressing Goal: Ability to communicate needs accurately will improve Outcome: Progressing   Problem: Nutrition: Goal: Risk of aspiration will decrease Outcome: Progressing Goal: Dietary intake will improve Outcome: Progressing

## 2023-06-02 DIAGNOSIS — R6521 Severe sepsis with septic shock: Secondary | ICD-10-CM | POA: Diagnosis not present

## 2023-06-02 DIAGNOSIS — A419 Sepsis, unspecified organism: Secondary | ICD-10-CM | POA: Diagnosis not present

## 2023-06-02 LAB — GLUCOSE, CAPILLARY
Glucose-Capillary: 84 mg/dL (ref 70–99)
Glucose-Capillary: 89 mg/dL (ref 70–99)
Glucose-Capillary: 109 mg/dL — ABNORMAL HIGH (ref 70–99)

## 2023-06-02 MED ORDER — CHLORHEXIDINE GLUCONATE CLOTH 2 % EX PADS
6.0000 | MEDICATED_PAD | Freq: Every day | CUTANEOUS | Status: DC
  Administered 2023-06-02 – 2023-06-11 (×9): 6 via TOPICAL

## 2023-06-02 NOTE — Progress Notes (Signed)
PROGRESS NOTE    [name]  ZOX:[id number] DOB: [birth year] DOA: 06/09/2023 PCP: [facility]   Brief Narrative:  [name] is a 42 year old woman with insulin-dependent diabetes mellitus, resultant end-stage renal disease, bilateral AKA, blindness and severe peripheral arterial disease.  She presents hypotensive from dialysis today.  She had a recent prolonged hospital stay in September 2024 for small bowel ischemia with subsequent small bowel obstruction.  She had an extended ileal cecotomy. Ultimately discharged 3 days prior to admission to SNF for ongoing care/therapy.  Of note her AV fistula was unable to be accessed due to pseudoaneurysm and repeated clotting at the time of admission.  She thus has a dialysis catheter placed in the right chest.  There is report that she was not adherent to dressing changes for her open abdominal wound, and did not agree to wound VAC.  She was found to be hypotensive in the ED and started on 2 vasopressors.  PCCM consulted for admission.  06/05/2023 admitted to the hospital with hypotension after dialysis concern for septic shock 05/29/2023 tolerated HD via fistula, weaning off pressors 10/18 - transferred to [facility] service - remains hypotensive - fluid challenge ongoing - HD successful  10/20 - Continues to be hypotensive - not tachycardic -unable to get in touch with patient's healthcare power of attorney(Sister [name])  Assessment & Plan:   Principal Problem:   Septic shock (HCC)  Goals of care discussion  -Attempted to discuss patient's case with her healthcare power of attorney [name], her sister, however she is unavailable by the phone number listed.  Patient's husband and family have no other ways to contact patient's healthcare power of attorney.  Will need to follow-up with ethics on how to proceed given clearly stated healthcare power of attorney who is otherwise unavailable. -Discussed patient's severe illness with  her husband and his family over the phone, they are not local and are unable to be in the hospital at this time.  Patient is full code at this time, it is unclear if patient has discussed her CODE STATUS with her power of attorney previously.  Patient's living will, while it does name a power of attorney it does not specify any advanced directives in regards to patient's wishes if she were to decompensate.  Septic Shock secondary to suspected wound/skin/soft tissue infection, POA Acute metabolic encephalopathy -Continue Vancomycin cefepime plan 7 days, flagyl d/c'd 10/16   -Blood pressure remains labile, questionable noncompliance with automated blood pressure readings -blood pressure ranging 162/94 to 32/20 without any symptoms -patient educated on need for compliance while taking vitals -Stress dose steroids completed -Continue midodrine - patient previously noncompliant with his medication likely exacerbating her hypotension despite scheduled midodrine patient's blood pressure remains markedly low - Appreciate surgery assistance - Palliative care consult with severe co morbid chronic organ failure and refusing or fighting wound care/medication administration/general care -she remains high risk for decompensation given her profound noncompliance and nonadherence to medication, physical exam, and treatment.  She remains full code.   End-stage renal disease, likely diabetic nephropathy -Temporary HD catheter removed -Appreciate nephrology assistance -Continue dialysis per nephrology -Monday Wednesday Friday   Type 2 diabetes Severe peripheral or vascular disease Recent small bowel ischemia Bilateral AKA -Semglee, sliding scale insulin   Punctate acute infarct R frontal love, acute to subacute infarct right parieto occipital infarct: --appreciate neurology assistance, stroke team to follow -- EEG without seizures, dysfunction likely at site of stroke  DVT prophylaxis: heparin injection 5,000  Units Start: 05/28/2023 2200 Code Status:   Code Status: Full Code Family Communication: None present  Status is: Inpatient Dispo: The patient is from: Facility              Anticipated d/c is to: Same              Anticipated d/c date is: 48 to 72 hours pending clinical course              Patient currently not medically stable for discharge  Consultants:  General surgery, PCCM, nephrology  Antimicrobials:  Cefepime, vancomycin  Subjective: No acute issues or events overnight, patient moderately more conversive today, she is unable to answer orientation questions other than the year.  She does not know the current month, president, or our current location other than hospital in [place].  She denies any overt nausea vomiting diarrhea constipation headache fevers chills or chest pain.  She reports generally feeling "unwell and tired" without specifics  Objective: Vitals:   06/01/23 2139 06/01/23 2143 06/02/23 0435 06/02/23 0500  BP: (!) 70/37 (!) 86/45 (!) 81/58   Pulse: 79 77 73   Resp: 17 16 15    Temp: 98.3 F (36.8 C) 98.3 F (36.8 C) 97.6 F (36.4 C)   TempSrc: Axillary Axillary Axillary   SpO2: 100% 100% 97%   Weight:    46 kg    Intake/Output Summary (Last 24 hours) at 06/02/2023 0738 Last data filed at 06/02/2023 0131 Gross per 24 hour  Intake 195.42 ml  Output --  Net 195.42 ml   Filed Weights   05/31/23 0500 06/01/23 0500 06/02/23 0500  Weight: 51 kg 46.6 kg 46 kg    Examination:  General:  Pleasantly resting in bed, No acute distress. HEENT:  Normocephalic atraumatic.  Sclerae nonicteric, noninjected.  Extraocular movements intact bilaterally. Neck:  Without mass or deformity.  Trachea is midline. Lungs: Unable to evaluate per patient refusal Heart:  Unable to evaluate per patient refusal Abdomen: Ostomy bag to suction without overt leak Extremities: Unable to evaluate per patient refusal - Bilateral AKA noted Skin: Extremely limited exam given  patient refusal, no obvious signs or symptoms of bleeding or skin breakdown over distal upper extremities  Data Reviewed: I have personally reviewed following labs and imaging studies  CBC: Recent Labs  Lab 05/18/2023 1510 06/10/2023 1511 05/28/23 0403 05/29/23 0505 05/29/23 0733 05/29/23 1610 05/30/23 0426  WBC 12.5*  --  27.9*  --  16.6*  --  16.1*  NEUTROABS 10.6*  --   --   --   --   --   --   HGB 7.5*   < > 7.1* 7.8* 6.6* 8.9* 9.1*  HCT 25.3*   < > 23.7* 23.0* 21.4* 27.2* 27.0*  MCV 88.8  --  88.4  --  86.6  --  87.9  PLT 316  --  416*  --  308  --  231   < > = values in this interval not displayed.   Basic Metabolic Panel: Recent Labs  Lab 05/29/2023 1510 05/15/2023 1511 05/24/2023 1514 05/28/23 0403 05/29/23 0505 05/29/23 0733 05/30/23 0426  NA 137   < > 137 129* 128* 128* 129*  K 4.0   < > 3.9 3.1* 5.4* 5.2* 3.9  CL 94*  --  95* 94*  --  97* 93*  CO2 24  --   --  19*  --  18* 22  GLUCOSE 111*  --  113* 308*  --  130* 151*  BUN 19  --  20 21*  --  28* 14  CREATININE 3.48*  --  3.70* 3.78*  --  4.15* 2.76*  CALCIUM 10.0  --   --  10.1  --  10.8* 9.9  MG 1.7  --   --  1.5*  --   --   --   PHOS  --   --   --  4.0  --  5.8* 3.9   < > = values in this interval not displayed.   GFR: Estimated Creatinine Clearance: 19.5 mL/min (A) (by C-G formula based on SCr of 2.76 mg/dL (H)). Liver Function Tests: Recent Labs  Lab 06/11/2023 1510 05/29/23 0733 05/30/23 0426  AST 59*  --   --   ALT 25  --   --   ALKPHOS 124  --   --   BILITOT 1.0  --   --   PROT 7.5  --   --   ALBUMIN 2.4* 1.9* 2.0*   No results for input(s): "LIPASE", "AMYLASE" in the last 168 hours.  CBG: Recent Labs  Lab 05/31/23 2106 06/01/23 0842 06/01/23 1253 06/01/23 1825 06/01/23 2148  GLUCAP 139* 105* 181* 109* 83    Sepsis Labs: Recent Labs  Lab 05/31/2023 1513 05/28/23 0403 05/28/23 0920  LATICACIDVEN 2.4* 3.5* 2.2*    Recent Results (from the past 240 hour(s))  Blood culture (routine x  2)     Status: None   Collection Time: 05/25/23 11:04 AM   Specimen: BLOOD  Result Value Ref Range Status   Specimen Description BLOOD LEFT ANTECUBITAL  Final   Special Requests   Final    BOTTLES DRAWN AEROBIC ONLY Blood Culture results may not be optimal due to an inadequate volume of blood received in culture bottles   Culture   Final    NO GROWTH 5 DAYS Performed at [facility], [address] [place], [place] [address]    Report Status 05/30/2023 FINAL  Final  Blood culture (routine x 2)     Status: None   Collection Time: 05/25/23 11:09 AM   Specimen: BLOOD LEFT HAND  Result Value Ref Range Status   Specimen Description BLOOD LEFT HAND  Final   Special Requests   Final    BOTTLES DRAWN AEROBIC ONLY Blood Culture results may not be optimal due to an inadequate volume of blood received in culture bottles   Culture   Final    NO GROWTH 5 DAYS Performed at [facility], [address], [place], [place] [address]    Report Status 05/30/2023 FINAL  Final  Blood culture (routine x 2)     Status: None   Collection Time: 06/06/2023  3:10 PM   Specimen: BLOOD LEFT ARM  Result Value Ref Range Status   Specimen Description BLOOD LEFT ARM  Final   Special Requests   Final    BOTTLES DRAWN AEROBIC AND ANAEROBIC Blood Culture adequate volume   Culture   Final    NO GROWTH 5 DAYS Performed at [facility], [address], [place], [place] [address]    Report Status 06/01/2023 FINAL  Final  Blood culture (routine x 2)     Status: None   Collection Time: 05/31/2023  3:27 PM   Specimen: BLOOD LEFT ARM  Result Value Ref Range Status   Specimen Description BLOOD LEFT ARM  Final   Special Requests   Final    BOTTLES DRAWN AEROBIC  AND ANAEROBIC Blood Culture adequate volume   Culture   Final    NO GROWTH 5 DAYS Performed at [facility], [address] [place], [place] [address]    Report Status 06/01/2023 FINAL  Final  MRSA Next Gen by PCR, Nasal     Status:  None   Collection Time: 05/17/2023  9:21 PM   Specimen: Nasal Mucosa; Nasal Swab  Result Value Ref Range Status   MRSA by PCR Next Gen NOT DETECTED NOT DETECTED Final    Comment: (NOTE) The GeneXpert MRSA Assay (FDA approved for NASAL specimens only), is one component of a comprehensive MRSA colonization surveillance program. It is not intended to diagnose MRSA infection nor to guide or monitor treatment for MRSA infections. Test performance is not FDA approved in patients less than 64 years old. Performed at [facility], [address], [place], [place] [address]          Radiology Studies: No results found.      Scheduled Meds:  atorvastatin  20 mg Oral Daily   Chlorhexidine Gluconate Cloth  6 each Topical Daily   Chlorhexidine Gluconate Cloth  6 each Topical Q0600   darbepoetin (ARANESP) injection - DIALYSIS  60 mcg Subcutaneous Q Fri-1800   dronabinol  5 mg Oral BID AC   heparin  5,000 Units Subcutaneous Q8H   insulin aspart  0-9 Units Subcutaneous TID WC   LORazepam  1 mg Intravenous Once   midodrine  10 mg Oral TID WC   sodium hypochlorite   Irrigation BID   Continuous Infusions:  ceFEPime (MAXIPIME) IV Stopped (06/01/23 1601)   vancomycin       LOS: 6 days   Time spent:  Azucena Fallen, DO [facility]  If 7PM-7AM, please contact night-coverage [email or web address]  06/02/2023, 7:38 AM

## 2023-06-02 NOTE — Progress Notes (Signed)
[facility] Progress Note   Subjective:   Patient seen and examined at bedside.  Reports being cold, covered her with the blankets on her bed.  Otherwise states "I'm Good" to all other questions.  Denies pain, SOB and n/v/d.   Objective Vitals:   06/01/23 2139 06/01/23 2143 06/02/23 0435 06/02/23 0500  BP: (!) 70/37 (!) 86/45 (!) 81/58   Pulse: 79 77 73   Resp: 17 16 15    Temp: 98.3 F (36.8 C) 98.3 F (36.8 C) 97.6 F (36.4 C)   TempSrc: Axillary Axillary Axillary   SpO2: 100% 100% 97%   Weight:    46 kg   Physical Exam General:chronically ill appearing female in NAD Heart:RRR, no MRG Lungs:CTAB, nml WOB on RA Abdomen:abd dressing in place Extremities:b/l BKA, no edema Dialysis Access: RU AVF +b/t   Filed Weights   05/31/23 0500 06/01/23 0500 06/02/23 0500  Weight: 51 kg 46.6 kg 46 kg    Intake/Output Summary (Last 24 hours) at 06/02/2023 0804 Last data filed at 06/02/2023 0131 Gross per 24 hour  Intake 195.42 ml  Output --  Net 195.42 ml    Additional Objective Labs: Basic Metabolic Panel: Recent Labs  Lab 05/28/23 0403 05/29/23 0505 05/29/23 0733 05/30/23 0426  NA 129* 128* 128* 129*  K 3.1* 5.4* 5.2* 3.9  CL 94*  --  97* 93*  CO2 19*  --  18* 22  GLUCOSE 308*  --  130* 151*  BUN 21*  --  28* 14  CREATININE 3.78*  --  4.15* 2.76*  CALCIUM 10.1  --  10.8* 9.9  PHOS 4.0  --  5.8* 3.9   Liver Function Tests: Recent Labs  Lab 05/27/23 1510 05/29/23 0733 05/30/23 0426  AST 59*  --   --   ALT 25  --   --   ALKPHOS 124  --   --   BILITOT 1.0  --   --   PROT 7.5  --   --   ALBUMIN 2.4* 1.9* 2.0*   CBC: Recent Labs  Lab 05/27/23 1510 05/27/23 1511 05/28/23 0403 05/29/23 0505 05/29/23 0733 05/29/23 1610 05/30/23 0426  WBC 12.5*  --  27.9*  --  16.6*  --  16.1*  NEUTROABS 10.6*  --   --   --   --   --   --   HGB 7.5*   < > 7.1*   < > 6.6* 8.9* 9.1*  HCT 25.3*   < > 23.7*   < > 21.4* 27.2* 27.0*  MCV 88.8  --  88.4  --   86.6  --  87.9  PLT 316  --  416*  --  308  --  231   < > = values in this interval not displayed.   Blood Culture    Component Value Date/Time   SDES BLOOD LEFT ARM 05/27/2023 1527   SPECREQUEST  05/27/2023 1527    BOTTLES DRAWN AEROBIC AND ANAEROBIC Blood Culture adequate volume   CULT  05/27/2023 1527    NO GROWTH 5 DAYS Performed at [facility], [address] [place], [place] [address]    REPTSTATUS 06/01/2023 FINAL 05/27/2023 1527    CBG: Recent Labs  Lab 05/31/23 2106 06/01/23 0842 06/01/23 1253 06/01/23 1825 06/01/23 2148  GLUCAP 139* 105* 181* 109* 83   Medications:  ceFEPime (MAXIPIME) IV Stopped (06/01/23 1601)   vancomycin      atorvastatin  20 mg Oral Daily   Chlorhexidine  Gluconate Cloth  6 each Topical Daily   Chlorhexidine Gluconate Cloth  6 each Topical Q0600   darbepoetin (ARANESP) injection - DIALYSIS  60 mcg Subcutaneous Q Fri-1800   dronabinol  5 mg Oral BID AC   heparin  5,000 Units Subcutaneous Q8H   insulin aspart  0-9 Units Subcutaneous TID WC   LORazepam  1 mg Intravenous Once   midodrine  10 mg Oral TID WC   sodium hypochlorite   Irrigation BID    Dialysis Orders: [facility] MWF 80 NR, 400/1.5 auto, 2K/2Ca, EDW 49kg, mircera 75 q2wks    Assessment/Plan: 1. Shock: presumed septic, on broad spectrum abx cefepime and vancomycin.  Off of vasopressor but on midodrine. Suspected related to abd wound.  2. 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 Necrotic tissue noted in wound and around ileostomy.  WC and surgery following.   3. 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  CTA completed. EEG with no seizure like activity. Per Neuro.  4. ESRD - MWF. Next HD on 10/21 using AVF.  Temporary dialysis catheter already removed.  5. Anemia of CKD- Hgb up to 9.1. s/p 1 unit pRBC. Continue ESA. 6. Secondary hyperparathyroidism - Calcium and phos in goal. Senispar on hold d/t GI issues.  Not on VDRA. Continue Fosrenol.  7.  HTN/volume - Hypotensive, on midodrine. Volume status improved.  UF as tolerated. 8. Nutrition - Alb low. No regular diet.  Monitor labs.   Virgina Norfolk, PA-C [facility] 06/02/2023,8:04 AM  LOS: 6 days

## 2023-06-02 NOTE — Plan of Care (Signed)
  Problem: Education: Goal: Ability to verbalize activity precautions or restrictions will improve Outcome: Progressing   Problem: Activity: Goal: Ability to perform//tolerate increased activity and mobilize with assistive devices will improve Outcome: Progressing   Problem: Clinical Measurements: Goal: Postoperative complications will be avoided or minimized Outcome: Progressing

## 2023-06-02 NOTE — Progress Notes (Signed)
Yellow MEWS d/t to low BP. Pt alert and oriented x2 (baseline) and appears asymptomatic. Dr. Natale Milch at bedside.

## 2023-06-02 NOTE — Progress Notes (Signed)
MEWS Progress Note  Patient Details Name: [name] MRN: [id number] DOB: 07-29-1981 Today's Date: 06/02/2023   MEWS Flowsheet Documentation:  Assess: MEWS Score Temp: 98.7 F (37.1 C) BP: (!) 70/55 MAP (mmHg): (!) 59 Pulse Rate: 89 ECG Heart Rate: 86 Resp: 15 Level of Consciousness: Alert SpO2: 100 % O2 Device: Room Air Patient Activity (if Appropriate): In bed O2 Flow Rate (L/min): 3 L/min Assess: MEWS Score MEWS Temp: 0 MEWS Systolic: 2 MEWS Pulse: 0 MEWS RR: 0 MEWS LOC: 0 MEWS Score: 2 MEWS Score Color: Yellow Assess: SIRS CRITERIA SIRS Temperature : 0 SIRS Respirations : 0 SIRS Pulse: 0 SIRS WBC: 0 SIRS Score Sum : 0 SIRS Temperature : 0 SIRS Pulse: 0 SIRS Respirations : 0 SIRS WBC: 0 SIRS Score Sum : 0 Assess: if the MEWS score is Yellow or Red Were vital signs accurate and taken at a resting state?: Yes Does the patient meet 2 or more of the SIRS criteria?: No Does the patient have a confirmed or suspected source of infection?: No MEWS guidelines implemented : Yes, yellow Treat MEWS Interventions: Considered administering scheduled or prn medications/treatments as ordered Take Vital Signs Increase Vital Sign Frequency : Yellow: Q2hr x1, continue Q4hrs until patient remains green for 12hrs Escalate MEWS: Escalate: Yellow: Discuss with charge nurse and consider notifying provider and/or RRT Notify: Charge Nurse/RN Name of Charge Nurse/RN Notified: Quarry manager Provider Notification Provider Name/Title: Natale Milch MD Date Provider Notified: 06/02/23 Time Provider Notified: [id number] Method of Notification: Face-to-face Provider response: At bedside Date of Provider Response: 06/02/23 Time of Provider Response: [address] 06/02/2023, 11:39 AM

## 2023-06-03 DIAGNOSIS — R6521 Severe sepsis with septic shock: Secondary | ICD-10-CM | POA: Diagnosis not present

## 2023-06-03 DIAGNOSIS — A419 Sepsis, unspecified organism: Secondary | ICD-10-CM | POA: Diagnosis not present

## 2023-06-03 LAB — GLUCOSE, CAPILLARY
Glucose-Capillary: 115 mg/dL — ABNORMAL HIGH (ref 70–99)
Glucose-Capillary: 120 mg/dL — ABNORMAL HIGH (ref 70–99)
Glucose-Capillary: 93 mg/dL (ref 70–99)
Glucose-Capillary: 103 mg/dL — ABNORMAL HIGH (ref 70–99)

## 2023-06-03 LAB — CBC
HCT: 33.7 % — ABNORMAL LOW (ref 36.0–46.0)
Hemoglobin: 10.6 g/dL — ABNORMAL LOW (ref 12.0–15.0)
MCH: 30.2 pg (ref 26.0–34.0)
MCHC: 31.5 g/dL (ref 30.0–36.0)
MCV: 96 fL (ref 80.0–100.0)
Platelets: 210 10*3/uL (ref 150–400)
RBC: 3.51 MIL/uL — ABNORMAL LOW (ref 3.87–5.11)
RDW: 23.4 % — ABNORMAL HIGH (ref 11.5–15.5)
WBC: 12.1 10*3/uL — ABNORMAL HIGH (ref 4.0–10.5)
nRBC: 1.1 % — ABNORMAL HIGH (ref 0.0–0.2)

## 2023-06-03 LAB — COMPREHENSIVE METABOLIC PANEL
ALT: 21 U/L (ref 0–44)
AST: 14 U/L — ABNORMAL LOW (ref 15–41)
Albumin: 1.8 g/dL — ABNORMAL LOW (ref 3.5–5.0)
Alkaline Phosphatase: 81 U/L (ref 38–126)
Anion gap: 13 (ref 5–15)
BUN: 27 mg/dL — ABNORMAL HIGH (ref 6–20)
CO2: 23 mmol/L (ref 22–32)
Calcium: 9.5 mg/dL (ref 8.9–10.3)
Chloride: 100 mmol/L (ref 98–111)
Creatinine, Ser: 4.5 mg/dL — ABNORMAL HIGH (ref 0.44–1.00)
GFR, Estimated: 12 mL/min — ABNORMAL LOW (ref >60)
Glucose, Bld: 112 mg/dL — ABNORMAL HIGH (ref 70–99)
Potassium: 4.1 mmol/L (ref 3.5–5.1)
Sodium: 136 mmol/L (ref 135–145)
Total Bilirubin: 1.1 mg/dL (ref 0.3–1.2)
Total Protein: 6 g/dL — ABNORMAL LOW (ref 6.5–8.1)

## 2023-06-03 MED ORDER — PENTAFLUOROPROP-TETRAFLUOROETH EX AERO: 1.0000 | INHALATION_SPRAY | CUTANEOUS | Status: DC | PRN

## 2023-06-03 NOTE — Progress Notes (Signed)
Pt receives out-pt HD at [facility] on MWF. Will assist as needed.   Cedricka Sackrider Renal Navigator [phone] 

## 2023-06-03 NOTE — Plan of Care (Signed)
  Problem: Education: Goal: Knowledge of the prescribed therapeutic regimen will improve Outcome: Progressing Goal: Ability to verbalize activity precautions or restrictions will improve Outcome: Progressing Goal: Understanding of discharge needs will improve Outcome: Progressing   Problem: Activity: Goal: Ability to perform//tolerate increased activity and mobilize with assistive devices will improve Outcome: Progressing   Problem: Clinical Measurements: Goal: Postoperative complications will be avoided or minimized Outcome: Progressing   Problem: Self-Care: Goal: Ability to meet self-care needs will improve Outcome: Progressing   Problem: Self-Concept: Goal: Ability to maintain and perform role responsibilities to the fullest extent possible will improve Outcome: Progressing   Problem: Pain Management: Goal: Pain level will decrease with appropriate interventions Outcome: Progressing   Problem: Skin Integrity: Goal: Demonstration of wound healing without infection will improve Outcome: Progressing   Problem: Activity: Goal: Risk for activity intolerance will decrease Outcome: Progressing   Problem: Nutrition: Goal: Adequate nutrition will be maintained Outcome: Progressing   Problem: Coping: Goal: Level of anxiety will decrease Outcome: Progressing   Problem: Elimination: Goal: Will not experience complications related to bowel motility Outcome: Progressing Goal: Will not experience complications related to urinary retention Outcome: Progressing   Problem: Pain Managment: Goal: General experience of comfort will improve Outcome: Progressing   Problem: Safety: Goal: Ability to remain free from injury will improve Outcome: Progressing   Problem: Skin Integrity: Goal: Risk for impaired skin integrity will decrease Outcome: Progressing   Problem: Education: Goal: Ability to describe self-care measures that may prevent or decrease complications (Diabetes  Survival Skills Education) will improve Outcome: Progressing Goal: Individualized Educational Video(s) Outcome: Progressing   Problem: Coping: Goal: Ability to adjust to condition or change in health will improve Outcome: Progressing   Problem: Fluid Volume: Goal: Ability to maintain a balanced intake and output will improve Outcome: Progressing   Problem: Health Behavior/Discharge Planning: Goal: Ability to identify and utilize available resources and services will improve Outcome: Progressing Goal: Ability to manage health-related needs will improve Outcome: Progressing   Problem: Metabolic: Goal: Ability to maintain appropriate glucose levels will improve Outcome: Progressing   Problem: Nutritional: Goal: Maintenance of adequate nutrition will improve Outcome: Progressing Goal: Progress toward achieving an optimal weight will improve Outcome: Progressing   Problem: Skin Integrity: Goal: Risk for impaired skin integrity will decrease Outcome: Progressing   Problem: Tissue Perfusion: Goal: Adequacy of tissue perfusion will improve Outcome: Progressing   Problem: Education: Goal: Knowledge of disease or condition will improve Outcome: Progressing Goal: Knowledge of secondary prevention will improve (MUST DOCUMENT ALL) Outcome: Progressing Goal: Knowledge of patient specific risk factors will improve [name] N/A or DELETE if not current risk factor) Outcome: Progressing   Problem: Ischemic Stroke/TIA Tissue Perfusion: Goal: Complications of ischemic stroke/TIA will be minimized Outcome: Progressing   Problem: Coping: Goal: Will verbalize positive feelings about self Outcome: Progressing Goal: Will identify appropriate support needs Outcome: Progressing   Problem: Health Behavior/Discharge Planning: Goal: Ability to manage health-related needs will improve Outcome: Progressing Goal: Goals will be collaboratively established with patient/family Outcome:  Progressing   Problem: Self-Care: Goal: Ability to participate in self-care as condition permits will improve Outcome: Progressing Goal: Verbalization of feelings and concerns over difficulty with self-care will improve Outcome: Progressing Goal: Ability to communicate needs accurately will improve Outcome: Progressing   Problem: Nutrition: Goal: Risk of aspiration will decrease Outcome: Progressing Goal: Dietary intake will improve Outcome: Progressing

## 2023-06-03 NOTE — Progress Notes (Signed)
PROGRESS NOTE    [name]  ION:[id number] DOB: 1981-08-12 DOA: 05/23/2023 PCP: [facility], [facility]   Brief Narrative:  [name] is a 42 year old woman with insulin-dependent diabetes mellitus, resultant end-stage renal disease, bilateral AKA, blindness and severe peripheral arterial disease.  She presents hypotensive from dialysis today.  She had a recent prolonged hospital stay in September 2024 for small bowel ischemia with subsequent small bowel obstruction.  She had an extended ileal cecotomy. Ultimately discharged 3 days prior to admission to SNF for ongoing care/therapy.  Of note her AV fistula was unable to be accessed due to pseudoaneurysm and repeated clotting at the time of admission.  She thus has a dialysis catheter placed in the right chest.  There is report that she was not adherent to dressing changes for her open abdominal wound, and did not agree to wound VAC.  She was found to be hypotensive in the ED and started on 2 vasopressors.  PCCM consulted for admission.  06/09/2023 admitted to the hospital with hypotension after dialysis concern for septic shock 05/29/2023 tolerated HD via fistula, weaning off pressors 10/18 - transferred to [facility] service - remains hypotensive - fluid challenge ongoing - HD successful  10/20 - Continues to be hypotensive - not tachycardic -unable to get in touch with patient's healthcare power of attorney(Sister [name]) 10/21 tolerated HD well today - continues to be noncompliant with interview/evaluation  Assessment & Plan:   Principal Problem:   Septic shock (HCC)  Goals of care discussion  -Attempted to discuss patient's case with her healthcare power of attorney [name], her sister, however she is unavailable by the phone number listed.  Patient's husband and family have no other ways to contact patient's healthcare power of attorney.  Will need to follow-up with ethics on how to proceed given clearly stated healthcare  power of attorney who is otherwise unavailable. -Discussed patient's severe illness with her husband and his family over the phone, they are not local and are unable to be in the hospital at this time.  Patient is full code at this time, it is unclear if patient has discussed her CODE STATUS with her power of attorney previously.  Patient's living will, while it does name a power of attorney it does not specify any advanced directives in regards to patient's wishes if she were to decompensate.  Septic Shock secondary to suspected wound/skin/soft tissue infection, POA Acute metabolic encephalopathy -Continue Vancomycin cefepime plan 7 days, flagyl d/c'd 10/16   -Blood pressure remains labile, questionable noncompliance with automated blood pressure readings -blood pressure ranging 162/94 to 32/20 without any symptoms -patient educated on need for compliance while taking vitals -Stress dose steroids completed -Continue midodrine - patient previously noncompliant with his medication likely exacerbating her hypotension despite scheduled midodrine patient's blood pressure remains markedly low - Appreciate surgery assistance - Palliative care consult with severe co morbid chronic organ failure and refusing or fighting wound care/medication administration/general care -she remains high risk for decompensation given her profound noncompliance and nonadherence to medication, physical exam, and treatment.  She remains full code.   End-stage renal disease, likely diabetic nephropathy -Temporary HD catheter removed -Appreciate nephrology assistance -Continue dialysis per nephrology -Monday Wednesday Friday   Type 2 diabetes Severe peripheral or vascular disease Recent small bowel ischemia Bilateral AKA -Semglee, sliding scale insulin   Punctate acute infarct R frontal love, acute to subacute infarct right parieto occipital infarct: --appreciate neurology assistance, stroke team to follow -- EEG without  seizures,  dysfunction likely at site of stroke  DVT prophylaxis: heparin injection 5,000 Units Start: 06/02/2023 2200 Code Status:   Code Status: Full Code Family Communication: None present  Status is: Inpatient Dispo: The patient is from: Facility              Anticipated d/c is to: Same              Anticipated d/c date is: unknown              Patient currently not medically stable for discharge  Consultants:  General surgery, PCCM, nephrology  Antimicrobials:  Cefepime, vancomycin  Subjective: No acute issues or events overnight, patient not willing to interact in any meaningful way again today  Objective: Vitals:   06/02/23 2130 06/03/23 0145 06/03/23 0500 06/03/23 0508  BP: 116/69 92/66  109/67  Pulse: 72   72  Resp: 15 16  16   Temp: 98 F (36.7 C) 97.6 F (36.4 C)  97.6 F (36.4 C)  TempSrc: Axillary Axillary  Axillary  SpO2: 98% 97%  100%  Weight:   45.5 kg     Intake/Output Summary (Last 24 hours) at 06/03/2023 0705 Last data filed at 06/03/2023 0428 Gross per 24 hour  Intake 104.44 ml  Output --  Net 104.44 ml   Filed Weights   06/01/23 0500 06/02/23 0500 06/03/23 0500  Weight: 46.6 kg 46 kg 45.5 kg    Examination:  General:  Pleasantly resting in bed, No acute distress. HEENT:  Normocephalic atraumatic.  Sclerae nonicteric, noninjected.  Extraocular movements intact bilaterally. Neck:  Without mass or deformity.  Trachea is midline. Lungs: Unable to evaluate per patient refusal Heart:  Unable to evaluate per patient refusal Abdomen: Ostomy bag to suction without overt leak Extremities: Unable to evaluate per patient refusal - Bilateral AKA noted Skin: Extremely limited exam given patient refusal, no obvious signs or symptoms of bleeding or skin breakdown over distal upper extremities  Data Reviewed: I have personally reviewed following labs and imaging studies  CBC: Recent Labs  Lab 06/06/2023 1510 05/17/2023 1511 05/28/23 0403 05/29/23 0505  05/29/23 0733 05/29/23 1610 05/30/23 0426  WBC 12.5*  --  27.9*  --  16.6*  --  16.1*  NEUTROABS 10.6*  --   --   --   --   --   --   HGB 7.5*   < > 7.1* 7.8* 6.6* 8.9* 9.1*  HCT 25.3*   < > 23.7* 23.0* 21.4* 27.2* 27.0*  MCV 88.8  --  88.4  --  86.6  --  87.9  PLT 316  --  416*  --  308  --  231   < > = values in this interval not displayed.   Basic Metabolic Panel: Recent Labs  Lab 06/13/2023 1510 05/24/2023 1511 05/25/2023 1514 05/28/23 0403 05/29/23 0505 05/29/23 0733 05/30/23 0426  NA 137   < > 137 129* 128* 128* 129*  K 4.0   < > 3.9 3.1* 5.4* 5.2* 3.9  CL 94*  --  95* 94*  --  97* 93*  CO2 24  --   --  19*  --  18* 22  GLUCOSE 111*  --  113* 308*  --  130* 151*  BUN 19  --  20 21*  --  28* 14  CREATININE 3.48*  --  3.70* 3.78*  --  4.15* 2.76*  CALCIUM 10.0  --   --  10.1  --  10.8* 9.9  MG 1.7  --   --  1.5*  --   --   --   PHOS  --   --   --  4.0  --  5.8* 3.9   < > = values in this interval not displayed.   GFR: Estimated Creatinine Clearance: 19.3 mL/min (A) (by C-G formula based on SCr of 2.76 mg/dL (H)). Liver Function Tests: Recent Labs  Lab 06/13/2023 1510 05/29/23 0733 05/30/23 0426  AST 59*  --   --   ALT 25  --   --   ALKPHOS 124  --   --   BILITOT 1.0  --   --   PROT 7.5  --   --   ALBUMIN 2.4* 1.9* 2.0*   No results for input(s): "LIPASE", "AMYLASE" in the last 168 hours.  CBG: Recent Labs  Lab 06/01/23 1825 06/01/23 2148 06/02/23 0848 06/02/23 1205 06/02/23 2137  GLUCAP 109* 83 84 89 109*    Sepsis Labs: Recent Labs  Lab 05/30/2023 1513 05/28/23 0403 05/28/23 0920  LATICACIDVEN 2.4* 3.5* 2.2*    Recent Results (from the past 240 hour(s))  Blood culture (routine x 2)     Status: None   Collection Time: 05/25/23 11:04 AM   Specimen: BLOOD  Result Value Ref Range Status   Specimen Description BLOOD LEFT ANTECUBITAL  Final   Special Requests   Final    BOTTLES DRAWN AEROBIC ONLY Blood Culture results may not be optimal due to an  inadequate volume of blood received in culture bottles   Culture   Final    NO GROWTH 5 DAYS Performed at [facility], [address], [place], [place] [address]    Report Status 05/30/2023 FINAL  Final  Blood culture (routine x 2)     Status: None   Collection Time: 05/25/23 11:09 AM   Specimen: BLOOD LEFT HAND  Result Value Ref Range Status   Specimen Description BLOOD LEFT HAND  Final   Special Requests   Final    BOTTLES DRAWN AEROBIC ONLY Blood Culture results may not be optimal due to an inadequate volume of blood received in culture bottles   Culture   Final    NO GROWTH 5 DAYS Performed at [facility], [address] [place], [place] [address]    Report Status 05/30/2023 FINAL  Final  Blood culture (routine x 2)     Status: None   Collection Time: 06/04/2023  3:10 PM   Specimen: BLOOD LEFT ARM  Result Value Ref Range Status   Specimen Description BLOOD LEFT ARM  Final   Special Requests   Final    BOTTLES DRAWN AEROBIC AND ANAEROBIC Blood Culture adequate volume   Culture   Final    NO GROWTH 5 DAYS Performed at [facility], [address], [place], [place] [address]    Report Status 06/01/2023 FINAL  Final  Blood culture (routine x 2)     Status: None   Collection Time: 05/29/2023  3:27 PM   Specimen: BLOOD LEFT ARM  Result Value Ref Range Status   Specimen Description BLOOD LEFT ARM  Final   Special Requests   Final    BOTTLES DRAWN AEROBIC AND ANAEROBIC Blood Culture adequate volume   Culture   Final    NO GROWTH 5 DAYS Performed at [facility], [address], [place], [place] [address]    Report Status 06/01/2023 FINAL  Final  MRSA Next Gen by PCR, Nasal  Status: None   Collection Time: 05/19/2023  9:21 PM   Specimen: Nasal Mucosa; Nasal Swab  Result Value Ref Range Status   MRSA by PCR Next Gen NOT DETECTED NOT DETECTED Final    Comment: (NOTE) The GeneXpert MRSA Assay (FDA approved for NASAL specimens only), is one component of  a comprehensive MRSA colonization surveillance program. It is not intended to diagnose MRSA infection nor to guide or monitor treatment for MRSA infections. Test performance is not FDA approved in patients less than 50 years old. Performed at [facility], [address] [place], [place] [address]          Radiology Studies: No results found.      Scheduled Meds:  atorvastatin  20 mg Oral Daily   Chlorhexidine Gluconate Cloth  6 each Topical Daily   Chlorhexidine Gluconate Cloth  6 each Topical Q0600   Chlorhexidine Gluconate Cloth  6 each Topical Q0600   darbepoetin (ARANESP) injection - DIALYSIS  60 mcg Subcutaneous Q Fri-1800   dronabinol  5 mg Oral BID AC   heparin  5,000 Units Subcutaneous Q8H   insulin aspart  0-9 Units Subcutaneous TID WC   LORazepam  1 mg Intravenous Once   midodrine  10 mg Oral TID WC   sodium hypochlorite   Irrigation BID   Continuous Infusions:  vancomycin       LOS: 7 days   Time spent:  Azucena Fallen, DO [facility]  If 7PM-7AM, please contact night-coverage [email or web address]  06/03/2023, 7:05 AM

## 2023-06-03 NOTE — Progress Notes (Signed)
Progress Note     Subjective: Seen in HD. States she is eating ok. States her dressings were changed this morning. Refusing abdominal exam.  Objective: Vital signs in last 24 hours: Temp:  [97.6 F (36.4 C)-98.4 F (36.9 C)] 98 F (36.7 C) (10/21 0714) Pulse Rate:  [68-82] 78 (10/21 1130) Resp:  [8-18] 11 (10/21 1130) BP: (92-143)/(33-88) 115/76 (10/21 1130) SpO2:  [97 %-100 %] 98 % (10/21 1130) Weight:  [45.5 kg] 45.5 kg (10/21 0500) Last BM Date : 06/03/23  Intake/Output from previous day: 10/20 0701 - 10/21 0700 In: 104.4 [IV Piggyback:104.4] Out: 400 [Stool:400] Intake/Output this shift: No intake/output data recorded.  PE: Refusing exam No acute distress  Lab Results:  Recent Labs    06/03/23 0955  WBC 12.1*  HGB 10.6*  HCT 33.7*  PLT 210   BMET Recent Labs    06/03/23 0955  NA 136  K 4.1  CL 100  CO2 23  GLUCOSE 112*  BUN 27*  CREATININE 4.50*  CALCIUM 9.5   PT/INR No results for input(s): "LABPROT", "INR" in the last 72 hours. CMP     Component Value Date/Time   NA 136 06/03/2023 0955   NA 133 (L) 05/18/2020 0927   K 4.1 06/03/2023 0955   CL 100 06/03/2023 0955   CO2 23 06/03/2023 0955   GLUCOSE 112 (H) 06/03/2023 0955   BUN 27 (H) 06/03/2023 0955   BUN 45 (H) 05/18/2020 0927   CREATININE 4.50 (H) 06/03/2023 0955   CALCIUM 9.5 06/03/2023 0955   CALCIUM 12.1 (H) 11/13/2021 2026   PROT 6.0 (L) 06/03/2023 0955   PROT 7.6 05/18/2020 0927   ALBUMIN 1.8 (L) 06/03/2023 0955   ALBUMIN 3.9 05/18/2020 0927   AST 14 (L) 06/03/2023 0955   ALT 21 06/03/2023 0955   ALKPHOS 81 06/03/2023 0955   BILITOT 1.1 06/03/2023 0955   BILITOT <0.2 05/18/2020 0927   GFRNONAA 12 (L) 06/03/2023 0955   GFRAA 6 (L) 05/18/2020 0927   Lipase     Component Value Date/Time   LIPASE 55 (H) 05/25/2023 1159       Studies/Results: No results found.  Anti-infectives: Anti-infectives (From admission, onward)    Start     Dose/Rate Route Frequency  Ordered Stop   05/31/23 2000  vancomycin (VANCOREADY) IVPB 500 mg/100 mL        500 mg 100 mL/hr over 60 Minutes Intravenous Once per day on Monday Wednesday Friday 05/31/23 1133 06/03/23 1959   05/29/23 1200  vancomycin (VANCOREADY) IVPB 500 mg/100 mL  Status:  Discontinued        500 mg 100 mL/hr over 60 Minutes Intravenous Every M-W-F (Hemodialysis) 05/28/23 0924 05/31/23 1133   05/28/23 1500  ceFEPIme (MAXIPIME) 2 g in sodium chloride 0.9 % 100 mL IVPB  Status:  Discontinued        2 g 200 mL/hr over 30 Minutes Intravenous Every 24 hours 05/28/23 0921 05/28/23 0922   05/28/23 1500  ceFEPIme (MAXIPIME) 1 g in sodium chloride 0.9 % 100 mL IVPB        1 g 200 mL/hr over 30 Minutes Intravenous Every 24 hours 05/28/23 0922 06/02/23 1716   05/28/23 0600  metroNIDAZOLE (FLAGYL) IVPB 500 mg  Status:  Discontinued        500 mg 100 mL/hr over 60 Minutes Intravenous Every 12 hours 05/28/23 0104 05/29/23 0847   05/27/23 1600  vancomycin (VANCOCIN) IVPB 1000 mg/200 mL premix  1,000 mg 200 mL/hr over 60 Minutes Intravenous  Once 05/27/23 1508 05/27/23 1933   05/27/23 1515  ceFEPIme (MAXIPIME) 1 g in sodium chloride 0.9 % 100 mL IVPB        1 g 200 mL/hr over 30 Minutes Intravenous  Once 05/27/23 1508 05/27/23 1558   05/27/23 1515  metroNIDAZOLE (FLAGYL) IVPB 500 mg        500 mg 100 mL/hr over 60 Minutes Intravenous  Once 05/27/23 1508 05/27/23 1933        Assessment/Plan Small bowel ischemia with SBO  POD#30 s/p diagnostic lap converted to ex-lap with extended ileo-cecectomy (mid-ileum to just past cecum), 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 Hillery Hunter Necrotic tissue at midline wound and around ileostomy - appreciate WOC assistance - Eakin's working  - concern for pseudomonas in midline - Dakin's x72 hrs  - document ileostomy output and will titrate antimotility agents accordingly -- 400 mL documented/24h - recommend re-engaging palliative for GOC  - patient refusing exam for me. Will try to  evaluate her abdominal wall again tomororw    FEN:  Renal diet, Glucerna ID: Rocephin/Flagyl 9/21 > 9/28 VTE: SCDs, SQH q 8h  - per CCM -  Septic shock - no intraabdominal source of infection on imaging, ?aspiration on CT Possible acute neurologic infarct - neuro following  IDDM ESD on HD Bilateral AKA Blind Severe peripheral arterial disease  LOS: 7 days     Adam Phenix, [facility] Surgery 06/03/2023, 11:46 AM Please see Amion for pager number during day hours 7:00am-4:30pm

## 2023-06-03 NOTE — Procedures (Signed)
HD Note:  Some information was entered later than the data was gathered due to patient care needs. The stated time with the data is accurate.  Received patient in bed to unit.   Alert and oriented.   Informed consent signed and in chart.   Access used: Right upper arm fistula Access issues: No thrill felt, bruit faint.  Cannulation was difficult.  BFR limit was 250  Patient tolerated treatment well.   TX duration: 3 hours  Alert, without acute distress.  Total UF removed: 500 ml  Hand-off given to patient's nurse.   Transported back to the room   Joelie Schou L. Dareen Piano, RN [facility] Dialysis Unit.

## 2023-06-03 NOTE — Consult Note (Signed)
DIALYSIS ACCESS Patient Name:  [name]  Date of Exam:   05/11/2023 Medical Rec #: [id number]      Accession #:    [id number] Date of Birth: 1980-08-20       Patient Gender: F Patient Age:   42 years Exam Location:  [facility] Procedure:      VAS US DUPLEX DIALYSIS ACCESS (AVF, AVG) Referring Phys: Crista Elliot --------------------------------------------------------------------------------  Reason for Exam: Unable to dialyze through AVF/AVG. Access Site: Right Upper Extremity. Access Type: Brachial-cephalic AVF. History: HX of RUE AVF revision 10/23/21 (aneurysm complication & ulcerated          skin). Limitations: Calcification Comparison Study: Prior right fistula duplex done 03/17/23  Performing Technologist: Sherren Kerns RVS  Examination Guidelines: A complete evaluation includes B-mode imaging, spectral Doppler, color Doppler, and power Doppler as needed of all accessible portions of each vessel. Unilateral testing is considered an integral part of a complete examination. Limited examinations for reoccurring indications may be performed as noted.  +------------+----------+-------------+----------+--------+ OUTFLOW VEINPSV (cm/s)Diameter (cm)Depth (cm)Describe +------------+----------+-------------+----------+--------+ Clavicle       255                                    +------------+----------+-------------+----------+--------+ Shoulder        57                                    +------------+----------+-------------+----------+--------+ Prox UA         56                                    +------------+----------+-------------+----------+--------+ Mid UA          82                                    +------------+----------+-------------+----------+--------+ Dist UA        101                                    +------------+----------+-------------+----------+--------+ AC Fossa       123                                    +------------+----------+-------------+----------+--------+ Prox Forearm   172                                    +------------+----------+-------------+----------+--------+ Thrombus noted mid to proximal upper arm  Summary: Arteriovenous fistula-Velocities less than 100cm/s noted throughout the upper arm. Arteriovenous fistula-Thrombus noted mid to proximal upper arm. *See table(s) above for measurements and observations.  Diagnosing physician: Sherald Hess MD Electronically signed by Sherald Hess MD on 05/13/2023 at 1:36:52 PM.   --------------------------------------------------------------------------------   Final    IR Fluoro Guide CV Line Left  Result Date: 05/10/2023 INDICATION: ESRD on HD.   Malfunctioning upper extremity AV fistula versus graft. EXAM: NON-TUNNELED CENTRAL VENOUS HEMODIALYSIS CATHETER PLACEMENT WITH ULTRASOUND AND FLUOROSCOPIC GUIDANCE COMPARISON:  Chest XR, 04/30/2023.  CTA head  DIALYSIS ACCESS Patient Name:  [name]  Date of Exam:   05/11/2023 Medical Rec #: [id number]      Accession #:    [id number] Date of Birth: 1980-08-20       Patient Gender: F Patient Age:   42 years Exam Location:  [facility] Procedure:      VAS US DUPLEX DIALYSIS ACCESS (AVF, AVG) Referring Phys: Crista Elliot --------------------------------------------------------------------------------  Reason for Exam: Unable to dialyze through AVF/AVG. Access Site: Right Upper Extremity. Access Type: Brachial-cephalic AVF. History: HX of RUE AVF revision 10/23/21 (aneurysm complication & ulcerated          skin). Limitations: Calcification Comparison Study: Prior right fistula duplex done 03/17/23  Performing Technologist: Sherren Kerns RVS  Examination Guidelines: A complete evaluation includes B-mode imaging, spectral Doppler, color Doppler, and power Doppler as needed of all accessible portions of each vessel. Unilateral testing is considered an integral part of a complete examination. Limited examinations for reoccurring indications may be performed as noted.  +------------+----------+-------------+----------+--------+ OUTFLOW VEINPSV (cm/s)Diameter (cm)Depth (cm)Describe +------------+----------+-------------+----------+--------+ Clavicle       255                                    +------------+----------+-------------+----------+--------+ Shoulder        57                                    +------------+----------+-------------+----------+--------+ Prox UA         56                                    +------------+----------+-------------+----------+--------+ Mid UA          82                                    +------------+----------+-------------+----------+--------+ Dist UA        101                                    +------------+----------+-------------+----------+--------+ AC Fossa       123                                    +------------+----------+-------------+----------+--------+ Prox Forearm   172                                    +------------+----------+-------------+----------+--------+ Thrombus noted mid to proximal upper arm  Summary: Arteriovenous fistula-Velocities less than 100cm/s noted throughout the upper arm. Arteriovenous fistula-Thrombus noted mid to proximal upper arm. *See table(s) above for measurements and observations.  Diagnosing physician: Sherald Hess MD Electronically signed by Sherald Hess MD on 05/13/2023 at 1:36:52 PM.   --------------------------------------------------------------------------------   Final    IR Fluoro Guide CV Line Left  Result Date: 05/10/2023 INDICATION: ESRD on HD.   Malfunctioning upper extremity AV fistula versus graft. EXAM: NON-TUNNELED CENTRAL VENOUS HEMODIALYSIS CATHETER PLACEMENT WITH ULTRASOUND AND FLUOROSCOPIC GUIDANCE COMPARISON:  Chest XR, 04/30/2023.  CTA head  DIALYSIS ACCESS Patient Name:  [name]  Date of Exam:   05/11/2023 Medical Rec #: [id number]      Accession #:    [id number] Date of Birth: 1980-08-20       Patient Gender: F Patient Age:   42 years Exam Location:  [facility] Procedure:      VAS US DUPLEX DIALYSIS ACCESS (AVF, AVG) Referring Phys: Crista Elliot --------------------------------------------------------------------------------  Reason for Exam: Unable to dialyze through AVF/AVG. Access Site: Right Upper Extremity. Access Type: Brachial-cephalic AVF. History: HX of RUE AVF revision 10/23/21 (aneurysm complication & ulcerated          skin). Limitations: Calcification Comparison Study: Prior right fistula duplex done 03/17/23  Performing Technologist: Sherren Kerns RVS  Examination Guidelines: A complete evaluation includes B-mode imaging, spectral Doppler, color Doppler, and power Doppler as needed of all accessible portions of each vessel. Unilateral testing is considered an integral part of a complete examination. Limited examinations for reoccurring indications may be performed as noted.  +------------+----------+-------------+----------+--------+ OUTFLOW VEINPSV (cm/s)Diameter (cm)Depth (cm)Describe +------------+----------+-------------+----------+--------+ Clavicle       255                                    +------------+----------+-------------+----------+--------+ Shoulder        57                                    +------------+----------+-------------+----------+--------+ Prox UA         56                                    +------------+----------+-------------+----------+--------+ Mid UA          82                                    +------------+----------+-------------+----------+--------+ Dist UA        101                                    +------------+----------+-------------+----------+--------+ AC Fossa       123                                    +------------+----------+-------------+----------+--------+ Prox Forearm   172                                    +------------+----------+-------------+----------+--------+ Thrombus noted mid to proximal upper arm  Summary: Arteriovenous fistula-Velocities less than 100cm/s noted throughout the upper arm. Arteriovenous fistula-Thrombus noted mid to proximal upper arm. *See table(s) above for measurements and observations.  Diagnosing physician: Sherald Hess MD Electronically signed by Sherald Hess MD on 05/13/2023 at 1:36:52 PM.   --------------------------------------------------------------------------------   Final    IR Fluoro Guide CV Line Left  Result Date: 05/10/2023 INDICATION: ESRD on HD.   Malfunctioning upper extremity AV fistula versus graft. EXAM: NON-TUNNELED CENTRAL VENOUS HEMODIALYSIS CATHETER PLACEMENT WITH ULTRASOUND AND FLUOROSCOPIC GUIDANCE COMPARISON:  Chest XR, 04/30/2023.  CTA head  DIALYSIS ACCESS Patient Name:  [name]  Date of Exam:   05/11/2023 Medical Rec #: [id number]      Accession #:    [id number] Date of Birth: 1980-08-20       Patient Gender: F Patient Age:   42 years Exam Location:  [facility] Procedure:      VAS US DUPLEX DIALYSIS ACCESS (AVF, AVG) Referring Phys: Crista Elliot --------------------------------------------------------------------------------  Reason for Exam: Unable to dialyze through AVF/AVG. Access Site: Right Upper Extremity. Access Type: Brachial-cephalic AVF. History: HX of RUE AVF revision 10/23/21 (aneurysm complication & ulcerated          skin). Limitations: Calcification Comparison Study: Prior right fistula duplex done 03/17/23  Performing Technologist: Sherren Kerns RVS  Examination Guidelines: A complete evaluation includes B-mode imaging, spectral Doppler, color Doppler, and power Doppler as needed of all accessible portions of each vessel. Unilateral testing is considered an integral part of a complete examination. Limited examinations for reoccurring indications may be performed as noted.  +------------+----------+-------------+----------+--------+ OUTFLOW VEINPSV (cm/s)Diameter (cm)Depth (cm)Describe +------------+----------+-------------+----------+--------+ Clavicle       255                                    +------------+----------+-------------+----------+--------+ Shoulder        57                                    +------------+----------+-------------+----------+--------+ Prox UA         56                                    +------------+----------+-------------+----------+--------+ Mid UA          82                                    +------------+----------+-------------+----------+--------+ Dist UA        101                                    +------------+----------+-------------+----------+--------+ AC Fossa       123                                    +------------+----------+-------------+----------+--------+ Prox Forearm   172                                    +------------+----------+-------------+----------+--------+ Thrombus noted mid to proximal upper arm  Summary: Arteriovenous fistula-Velocities less than 100cm/s noted throughout the upper arm. Arteriovenous fistula-Thrombus noted mid to proximal upper arm. *See table(s) above for measurements and observations.  Diagnosing physician: Sherald Hess MD Electronically signed by Sherald Hess MD on 05/13/2023 at 1:36:52 PM.   --------------------------------------------------------------------------------   Final    IR Fluoro Guide CV Line Left  Result Date: 05/10/2023 INDICATION: ESRD on HD.   Malfunctioning upper extremity AV fistula versus graft. EXAM: NON-TUNNELED CENTRAL VENOUS HEMODIALYSIS CATHETER PLACEMENT WITH ULTRASOUND AND FLUOROSCOPIC GUIDANCE COMPARISON:  Chest XR, 04/30/2023.  CTA head  of Transportation (Medical): No    Lack of Transportation (Non-Medical): No  Physical Activity: Not on file  Stress: Not on file  Social Connections: Not on file    Review of Systems: A 12 point ROS discussed and pertinent positives are indicated in the HPI above.  All other systems are negative.  Vital Signs: BP 101/74   Pulse 78   Temp 98 F (36.7 C) (Oral)   Resp 10   Wt 100 lb 5 oz (45.5 kg)   SpO2 98%   BMI 18.95 kg/m     Physical Exam Vitals reviewed.  HENT:     Mouth/Throat:     Mouth: Mucous membranes are moist.  Cardiovascular:     Rate and Rhythm: Normal rate and regular rhythm.  Pulmonary:     Breath sounds: Normal breath sounds. No wheezing.  Abdominal:     Palpations: Abdomen is soft.  Musculoskeletal:     Comments: Right arm is swollen Dialysis ongoing-- unable to assess for bruit or thrill   Skin:    General: Skin is warm.  Neurological:     Mental Status: She is alert.     Imaging: ECHOCARDIOGRAM COMPLETE  Result Date: 05/29/2023    ECHOCARDIOGRAM REPORT   Patient Name:   [name] Date of Exam: 05/29/2023 Medical Rec #:  [id number]     Height:       61.0 in Accession #:    [id number]    Weight:       120.6 lb Date of Birth:  31-Jan-1981      BSA:          1.523 m Patient Age:    42 years      BP:           123/55 mmHg Patient Gender: F             HR:           70 bpm. Exam Location:  Inpatient Procedure: 2D Echo, Cardiac Doppler and Color Doppler Indications:    Stroke I63.9  History:        Patient has prior  history of Echocardiogram examinations, most                 recent 07/24/2022. CAD, PAD and Stroke; Risk                 Factors:Hypertension, Diabetes, Dyslipidemia and Non-Smoker.  Sonographer:    Dondra Prader RVT RCS Referring Phys: [id number] ASHISH ARORA  Sonographer Comments: Technically challenging due to wound, patient position and lack of cooperation. Patient appeared to be agitated. IMPRESSIONS  1. Left ventricular ejection fraction, by estimation, is 25 to 30%. The left ventricle has severely decreased function. The left ventricle demonstrates regional wall motion abnormalities (see scoring diagram/findings for description). There is moderate asymmetric left ventricular hypertrophy of the septal segment. Indeterminate diastolic filling due to E-A fusion.  2. Right ventricular systolic function is moderately reduced. The right ventricular size is mildly enlarged.  3. Catheter tip noted in the RA.  4. The mitral valve is degenerative. Mild to moderate mitral valve regurgitation. No evidence of mitral stenosis.  5. Tricuspid valve regurgitation is moderate to severe.  6. Focal calcification of the NCC noted. The aortic valve is tricuspid. Aortic valve regurgitation is not visualized. Aortic valve sclerosis is present, with no evidence of aortic valve stenosis. Comparison(s): Changes from prior study are noted. The left ventricular function is significantly worse. LVEF now severely reduced with  of Transportation (Medical): No    Lack of Transportation (Non-Medical): No  Physical Activity: Not on file  Stress: Not on file  Social Connections: Not on file    Review of Systems: A 12 point ROS discussed and pertinent positives are indicated in the HPI above.  All other systems are negative.  Vital Signs: BP 101/74   Pulse 78   Temp 98 F (36.7 C) (Oral)   Resp 10   Wt 100 lb 5 oz (45.5 kg)   SpO2 98%   BMI 18.95 kg/m     Physical Exam Vitals reviewed.  HENT:     Mouth/Throat:     Mouth: Mucous membranes are moist.  Cardiovascular:     Rate and Rhythm: Normal rate and regular rhythm.  Pulmonary:     Breath sounds: Normal breath sounds. No wheezing.  Abdominal:     Palpations: Abdomen is soft.  Musculoskeletal:     Comments: Right arm is swollen Dialysis ongoing-- unable to assess for bruit or thrill   Skin:    General: Skin is warm.  Neurological:     Mental Status: She is alert.     Imaging: ECHOCARDIOGRAM COMPLETE  Result Date: 05/29/2023    ECHOCARDIOGRAM REPORT   Patient Name:   [name] Date of Exam: 05/29/2023 Medical Rec #:  [id number]     Height:       61.0 in Accession #:    [id number]    Weight:       120.6 lb Date of Birth:  31-Jan-1981      BSA:          1.523 m Patient Age:    42 years      BP:           123/55 mmHg Patient Gender: F             HR:           70 bpm. Exam Location:  Inpatient Procedure: 2D Echo, Cardiac Doppler and Color Doppler Indications:    Stroke I63.9  History:        Patient has prior  history of Echocardiogram examinations, most                 recent 07/24/2022. CAD, PAD and Stroke; Risk                 Factors:Hypertension, Diabetes, Dyslipidemia and Non-Smoker.  Sonographer:    Dondra Prader RVT RCS Referring Phys: [id number] ASHISH ARORA  Sonographer Comments: Technically challenging due to wound, patient position and lack of cooperation. Patient appeared to be agitated. IMPRESSIONS  1. Left ventricular ejection fraction, by estimation, is 25 to 30%. The left ventricle has severely decreased function. The left ventricle demonstrates regional wall motion abnormalities (see scoring diagram/findings for description). There is moderate asymmetric left ventricular hypertrophy of the septal segment. Indeterminate diastolic filling due to E-A fusion.  2. Right ventricular systolic function is moderately reduced. The right ventricular size is mildly enlarged.  3. Catheter tip noted in the RA.  4. The mitral valve is degenerative. Mild to moderate mitral valve regurgitation. No evidence of mitral stenosis.  5. Tricuspid valve regurgitation is moderate to severe.  6. Focal calcification of the NCC noted. The aortic valve is tricuspid. Aortic valve regurgitation is not visualized. Aortic valve sclerosis is present, with no evidence of aortic valve stenosis. Comparison(s): Changes from prior study are noted. The left ventricular function is significantly worse. LVEF now severely reduced with  of Transportation (Medical): No    Lack of Transportation (Non-Medical): No  Physical Activity: Not on file  Stress: Not on file  Social Connections: Not on file    Review of Systems: A 12 point ROS discussed and pertinent positives are indicated in the HPI above.  All other systems are negative.  Vital Signs: BP 101/74   Pulse 78   Temp 98 F (36.7 C) (Oral)   Resp 10   Wt 100 lb 5 oz (45.5 kg)   SpO2 98%   BMI 18.95 kg/m     Physical Exam Vitals reviewed.  HENT:     Mouth/Throat:     Mouth: Mucous membranes are moist.  Cardiovascular:     Rate and Rhythm: Normal rate and regular rhythm.  Pulmonary:     Breath sounds: Normal breath sounds. No wheezing.  Abdominal:     Palpations: Abdomen is soft.  Musculoskeletal:     Comments: Right arm is swollen Dialysis ongoing-- unable to assess for bruit or thrill   Skin:    General: Skin is warm.  Neurological:     Mental Status: She is alert.     Imaging: ECHOCARDIOGRAM COMPLETE  Result Date: 05/29/2023    ECHOCARDIOGRAM REPORT   Patient Name:   [name] Date of Exam: 05/29/2023 Medical Rec #:  [id number]     Height:       61.0 in Accession #:    [id number]    Weight:       120.6 lb Date of Birth:  31-Jan-1981      BSA:          1.523 m Patient Age:    42 years      BP:           123/55 mmHg Patient Gender: F             HR:           70 bpm. Exam Location:  Inpatient Procedure: 2D Echo, Cardiac Doppler and Color Doppler Indications:    Stroke I63.9  History:        Patient has prior  history of Echocardiogram examinations, most                 recent 07/24/2022. CAD, PAD and Stroke; Risk                 Factors:Hypertension, Diabetes, Dyslipidemia and Non-Smoker.  Sonographer:    Dondra Prader RVT RCS Referring Phys: [id number] ASHISH ARORA  Sonographer Comments: Technically challenging due to wound, patient position and lack of cooperation. Patient appeared to be agitated. IMPRESSIONS  1. Left ventricular ejection fraction, by estimation, is 25 to 30%. The left ventricle has severely decreased function. The left ventricle demonstrates regional wall motion abnormalities (see scoring diagram/findings for description). There is moderate asymmetric left ventricular hypertrophy of the septal segment. Indeterminate diastolic filling due to E-A fusion.  2. Right ventricular systolic function is moderately reduced. The right ventricular size is mildly enlarged.  3. Catheter tip noted in the RA.  4. The mitral valve is degenerative. Mild to moderate mitral valve regurgitation. No evidence of mitral stenosis.  5. Tricuspid valve regurgitation is moderate to severe.  6. Focal calcification of the NCC noted. The aortic valve is tricuspid. Aortic valve regurgitation is not visualized. Aortic valve sclerosis is present, with no evidence of aortic valve stenosis. Comparison(s): Changes from prior study are noted. The left ventricular function is significantly worse. LVEF now severely reduced with  DIALYSIS ACCESS Patient Name:  [name]  Date of Exam:   05/11/2023 Medical Rec #: [id number]      Accession #:    [id number] Date of Birth: 1980-08-20       Patient Gender: F Patient Age:   42 years Exam Location:  [facility] Procedure:      VAS US DUPLEX DIALYSIS ACCESS (AVF, AVG) Referring Phys: Crista Elliot --------------------------------------------------------------------------------  Reason for Exam: Unable to dialyze through AVF/AVG. Access Site: Right Upper Extremity. Access Type: Brachial-cephalic AVF. History: HX of RUE AVF revision 10/23/21 (aneurysm complication & ulcerated          skin). Limitations: Calcification Comparison Study: Prior right fistula duplex done 03/17/23  Performing Technologist: Sherren Kerns RVS  Examination Guidelines: A complete evaluation includes B-mode imaging, spectral Doppler, color Doppler, and power Doppler as needed of all accessible portions of each vessel. Unilateral testing is considered an integral part of a complete examination. Limited examinations for reoccurring indications may be performed as noted.  +------------+----------+-------------+----------+--------+ OUTFLOW VEINPSV (cm/s)Diameter (cm)Depth (cm)Describe +------------+----------+-------------+----------+--------+ Clavicle       255                                    +------------+----------+-------------+----------+--------+ Shoulder        57                                    +------------+----------+-------------+----------+--------+ Prox UA         56                                    +------------+----------+-------------+----------+--------+ Mid UA          82                                    +------------+----------+-------------+----------+--------+ Dist UA        101                                    +------------+----------+-------------+----------+--------+ AC Fossa       123                                    +------------+----------+-------------+----------+--------+ Prox Forearm   172                                    +------------+----------+-------------+----------+--------+ Thrombus noted mid to proximal upper arm  Summary: Arteriovenous fistula-Velocities less than 100cm/s noted throughout the upper arm. Arteriovenous fistula-Thrombus noted mid to proximal upper arm. *See table(s) above for measurements and observations.  Diagnosing physician: Sherald Hess MD Electronically signed by Sherald Hess MD on 05/13/2023 at 1:36:52 PM.   --------------------------------------------------------------------------------   Final    IR Fluoro Guide CV Line Left  Result Date: 05/10/2023 INDICATION: ESRD on HD.   Malfunctioning upper extremity AV fistula versus graft. EXAM: NON-TUNNELED CENTRAL VENOUS HEMODIALYSIS CATHETER PLACEMENT WITH ULTRASOUND AND FLUOROSCOPIC GUIDANCE COMPARISON:  Chest XR, 04/30/2023.  CTA head  DIALYSIS ACCESS Patient Name:  [name]  Date of Exam:   05/11/2023 Medical Rec #: [id number]      Accession #:    [id number] Date of Birth: 1980-08-20       Patient Gender: F Patient Age:   42 years Exam Location:  [facility] Procedure:      VAS US DUPLEX DIALYSIS ACCESS (AVF, AVG) Referring Phys: Crista Elliot --------------------------------------------------------------------------------  Reason for Exam: Unable to dialyze through AVF/AVG. Access Site: Right Upper Extremity. Access Type: Brachial-cephalic AVF. History: HX of RUE AVF revision 10/23/21 (aneurysm complication & ulcerated          skin). Limitations: Calcification Comparison Study: Prior right fistula duplex done 03/17/23  Performing Technologist: Sherren Kerns RVS  Examination Guidelines: A complete evaluation includes B-mode imaging, spectral Doppler, color Doppler, and power Doppler as needed of all accessible portions of each vessel. Unilateral testing is considered an integral part of a complete examination. Limited examinations for reoccurring indications may be performed as noted.  +------------+----------+-------------+----------+--------+ OUTFLOW VEINPSV (cm/s)Diameter (cm)Depth (cm)Describe +------------+----------+-------------+----------+--------+ Clavicle       255                                    +------------+----------+-------------+----------+--------+ Shoulder        57                                    +------------+----------+-------------+----------+--------+ Prox UA         56                                    +------------+----------+-------------+----------+--------+ Mid UA          82                                    +------------+----------+-------------+----------+--------+ Dist UA        101                                    +------------+----------+-------------+----------+--------+ AC Fossa       123                                    +------------+----------+-------------+----------+--------+ Prox Forearm   172                                    +------------+----------+-------------+----------+--------+ Thrombus noted mid to proximal upper arm  Summary: Arteriovenous fistula-Velocities less than 100cm/s noted throughout the upper arm. Arteriovenous fistula-Thrombus noted mid to proximal upper arm. *See table(s) above for measurements and observations.  Diagnosing physician: Sherald Hess MD Electronically signed by Sherald Hess MD on 05/13/2023 at 1:36:52 PM.   --------------------------------------------------------------------------------   Final    IR Fluoro Guide CV Line Left  Result Date: 05/10/2023 INDICATION: ESRD on HD.   Malfunctioning upper extremity AV fistula versus graft. EXAM: NON-TUNNELED CENTRAL VENOUS HEMODIALYSIS CATHETER PLACEMENT WITH ULTRASOUND AND FLUOROSCOPIC GUIDANCE COMPARISON:  Chest XR, 04/30/2023.  CTA head  DIALYSIS ACCESS Patient Name:  [name]  Date of Exam:   05/11/2023 Medical Rec #: [id number]      Accession #:    [id number] Date of Birth: 1980-08-20       Patient Gender: F Patient Age:   42 years Exam Location:  [facility] Procedure:      VAS US DUPLEX DIALYSIS ACCESS (AVF, AVG) Referring Phys: Crista Elliot --------------------------------------------------------------------------------  Reason for Exam: Unable to dialyze through AVF/AVG. Access Site: Right Upper Extremity. Access Type: Brachial-cephalic AVF. History: HX of RUE AVF revision 10/23/21 (aneurysm complication & ulcerated          skin). Limitations: Calcification Comparison Study: Prior right fistula duplex done 03/17/23  Performing Technologist: Sherren Kerns RVS  Examination Guidelines: A complete evaluation includes B-mode imaging, spectral Doppler, color Doppler, and power Doppler as needed of all accessible portions of each vessel. Unilateral testing is considered an integral part of a complete examination. Limited examinations for reoccurring indications may be performed as noted.  +------------+----------+-------------+----------+--------+ OUTFLOW VEINPSV (cm/s)Diameter (cm)Depth (cm)Describe +------------+----------+-------------+----------+--------+ Clavicle       255                                    +------------+----------+-------------+----------+--------+ Shoulder        57                                    +------------+----------+-------------+----------+--------+ Prox UA         56                                    +------------+----------+-------------+----------+--------+ Mid UA          82                                    +------------+----------+-------------+----------+--------+ Dist UA        101                                    +------------+----------+-------------+----------+--------+ AC Fossa       123                                    +------------+----------+-------------+----------+--------+ Prox Forearm   172                                    +------------+----------+-------------+----------+--------+ Thrombus noted mid to proximal upper arm  Summary: Arteriovenous fistula-Velocities less than 100cm/s noted throughout the upper arm. Arteriovenous fistula-Thrombus noted mid to proximal upper arm. *See table(s) above for measurements and observations.  Diagnosing physician: Sherald Hess MD Electronically signed by Sherald Hess MD on 05/13/2023 at 1:36:52 PM.   --------------------------------------------------------------------------------   Final    IR Fluoro Guide CV Line Left  Result Date: 05/10/2023 INDICATION: ESRD on HD.   Malfunctioning upper extremity AV fistula versus graft. EXAM: NON-TUNNELED CENTRAL VENOUS HEMODIALYSIS CATHETER PLACEMENT WITH ULTRASOUND AND FLUOROSCOPIC GUIDANCE COMPARISON:  Chest XR, 04/30/2023.  CTA head  DIALYSIS ACCESS Patient Name:  [name]  Date of Exam:   05/11/2023 Medical Rec #: [id number]      Accession #:    [id number] Date of Birth: 1980-08-20       Patient Gender: F Patient Age:   42 years Exam Location:  [facility] Procedure:      VAS US DUPLEX DIALYSIS ACCESS (AVF, AVG) Referring Phys: Crista Elliot --------------------------------------------------------------------------------  Reason for Exam: Unable to dialyze through AVF/AVG. Access Site: Right Upper Extremity. Access Type: Brachial-cephalic AVF. History: HX of RUE AVF revision 10/23/21 (aneurysm complication & ulcerated          skin). Limitations: Calcification Comparison Study: Prior right fistula duplex done 03/17/23  Performing Technologist: Sherren Kerns RVS  Examination Guidelines: A complete evaluation includes B-mode imaging, spectral Doppler, color Doppler, and power Doppler as needed of all accessible portions of each vessel. Unilateral testing is considered an integral part of a complete examination. Limited examinations for reoccurring indications may be performed as noted.  +------------+----------+-------------+----------+--------+ OUTFLOW VEINPSV (cm/s)Diameter (cm)Depth (cm)Describe +------------+----------+-------------+----------+--------+ Clavicle       255                                    +------------+----------+-------------+----------+--------+ Shoulder        57                                    +------------+----------+-------------+----------+--------+ Prox UA         56                                    +------------+----------+-------------+----------+--------+ Mid UA          82                                    +------------+----------+-------------+----------+--------+ Dist UA        101                                    +------------+----------+-------------+----------+--------+ AC Fossa       123                                    +------------+----------+-------------+----------+--------+ Prox Forearm   172                                    +------------+----------+-------------+----------+--------+ Thrombus noted mid to proximal upper arm  Summary: Arteriovenous fistula-Velocities less than 100cm/s noted throughout the upper arm. Arteriovenous fistula-Thrombus noted mid to proximal upper arm. *See table(s) above for measurements and observations.  Diagnosing physician: Sherald Hess MD Electronically signed by Sherald Hess MD on 05/13/2023 at 1:36:52 PM.   --------------------------------------------------------------------------------   Final    IR Fluoro Guide CV Line Left  Result Date: 05/10/2023 INDICATION: ESRD on HD.   Malfunctioning upper extremity AV fistula versus graft. EXAM: NON-TUNNELED CENTRAL VENOUS HEMODIALYSIS CATHETER PLACEMENT WITH ULTRASOUND AND FLUOROSCOPIC GUIDANCE COMPARISON:  Chest XR, 04/30/2023.  CTA head  DIALYSIS ACCESS Patient Name:  [name]  Date of Exam:   05/11/2023 Medical Rec #: [id number]      Accession #:    [id number] Date of Birth: 1980-08-20       Patient Gender: F Patient Age:   42 years Exam Location:  [facility] Procedure:      VAS US DUPLEX DIALYSIS ACCESS (AVF, AVG) Referring Phys: Crista Elliot --------------------------------------------------------------------------------  Reason for Exam: Unable to dialyze through AVF/AVG. Access Site: Right Upper Extremity. Access Type: Brachial-cephalic AVF. History: HX of RUE AVF revision 10/23/21 (aneurysm complication & ulcerated          skin). Limitations: Calcification Comparison Study: Prior right fistula duplex done 03/17/23  Performing Technologist: Sherren Kerns RVS  Examination Guidelines: A complete evaluation includes B-mode imaging, spectral Doppler, color Doppler, and power Doppler as needed of all accessible portions of each vessel. Unilateral testing is considered an integral part of a complete examination. Limited examinations for reoccurring indications may be performed as noted.  +------------+----------+-------------+----------+--------+ OUTFLOW VEINPSV (cm/s)Diameter (cm)Depth (cm)Describe +------------+----------+-------------+----------+--------+ Clavicle       255                                    +------------+----------+-------------+----------+--------+ Shoulder        57                                    +------------+----------+-------------+----------+--------+ Prox UA         56                                    +------------+----------+-------------+----------+--------+ Mid UA          82                                    +------------+----------+-------------+----------+--------+ Dist UA        101                                    +------------+----------+-------------+----------+--------+ AC Fossa       123                                    +------------+----------+-------------+----------+--------+ Prox Forearm   172                                    +------------+----------+-------------+----------+--------+ Thrombus noted mid to proximal upper arm  Summary: Arteriovenous fistula-Velocities less than 100cm/s noted throughout the upper arm. Arteriovenous fistula-Thrombus noted mid to proximal upper arm. *See table(s) above for measurements and observations.  Diagnosing physician: Sherald Hess MD Electronically signed by Sherald Hess MD on 05/13/2023 at 1:36:52 PM.   --------------------------------------------------------------------------------   Final    IR Fluoro Guide CV Line Left  Result Date: 05/10/2023 INDICATION: ESRD on HD.   Malfunctioning upper extremity AV fistula versus graft. EXAM: NON-TUNNELED CENTRAL VENOUS HEMODIALYSIS CATHETER PLACEMENT WITH ULTRASOUND AND FLUOROSCOPIC GUIDANCE COMPARISON:  Chest XR, 04/30/2023.  CTA head  of Transportation (Medical): No    Lack of Transportation (Non-Medical): No  Physical Activity: Not on file  Stress: Not on file  Social Connections: Not on file    Review of Systems: A 12 point ROS discussed and pertinent positives are indicated in the HPI above.  All other systems are negative.  Vital Signs: BP 101/74   Pulse 78   Temp 98 F (36.7 C) (Oral)   Resp 10   Wt 100 lb 5 oz (45.5 kg)   SpO2 98%   BMI 18.95 kg/m     Physical Exam Vitals reviewed.  HENT:     Mouth/Throat:     Mouth: Mucous membranes are moist.  Cardiovascular:     Rate and Rhythm: Normal rate and regular rhythm.  Pulmonary:     Breath sounds: Normal breath sounds. No wheezing.  Abdominal:     Palpations: Abdomen is soft.  Musculoskeletal:     Comments: Right arm is swollen Dialysis ongoing-- unable to assess for bruit or thrill   Skin:    General: Skin is warm.  Neurological:     Mental Status: She is alert.     Imaging: ECHOCARDIOGRAM COMPLETE  Result Date: 05/29/2023    ECHOCARDIOGRAM REPORT   Patient Name:   [name] Date of Exam: 05/29/2023 Medical Rec #:  [id number]     Height:       61.0 in Accession #:    [id number]    Weight:       120.6 lb Date of Birth:  31-Jan-1981      BSA:          1.523 m Patient Age:    42 years      BP:           123/55 mmHg Patient Gender: F             HR:           70 bpm. Exam Location:  Inpatient Procedure: 2D Echo, Cardiac Doppler and Color Doppler Indications:    Stroke I63.9  History:        Patient has prior  history of Echocardiogram examinations, most                 recent 07/24/2022. CAD, PAD and Stroke; Risk                 Factors:Hypertension, Diabetes, Dyslipidemia and Non-Smoker.  Sonographer:    Dondra Prader RVT RCS Referring Phys: [id number] ASHISH ARORA  Sonographer Comments: Technically challenging due to wound, patient position and lack of cooperation. Patient appeared to be agitated. IMPRESSIONS  1. Left ventricular ejection fraction, by estimation, is 25 to 30%. The left ventricle has severely decreased function. The left ventricle demonstrates regional wall motion abnormalities (see scoring diagram/findings for description). There is moderate asymmetric left ventricular hypertrophy of the septal segment. Indeterminate diastolic filling due to E-A fusion.  2. Right ventricular systolic function is moderately reduced. The right ventricular size is mildly enlarged.  3. Catheter tip noted in the RA.  4. The mitral valve is degenerative. Mild to moderate mitral valve regurgitation. No evidence of mitral stenosis.  5. Tricuspid valve regurgitation is moderate to severe.  6. Focal calcification of the NCC noted. The aortic valve is tricuspid. Aortic valve regurgitation is not visualized. Aortic valve sclerosis is present, with no evidence of aortic valve stenosis. Comparison(s): Changes from prior study are noted. The left ventricular function is significantly worse. LVEF now severely reduced with  of Transportation (Medical): No    Lack of Transportation (Non-Medical): No  Physical Activity: Not on file  Stress: Not on file  Social Connections: Not on file    Review of Systems: A 12 point ROS discussed and pertinent positives are indicated in the HPI above.  All other systems are negative.  Vital Signs: BP 101/74   Pulse 78   Temp 98 F (36.7 C) (Oral)   Resp 10   Wt 100 lb 5 oz (45.5 kg)   SpO2 98%   BMI 18.95 kg/m     Physical Exam Vitals reviewed.  HENT:     Mouth/Throat:     Mouth: Mucous membranes are moist.  Cardiovascular:     Rate and Rhythm: Normal rate and regular rhythm.  Pulmonary:     Breath sounds: Normal breath sounds. No wheezing.  Abdominal:     Palpations: Abdomen is soft.  Musculoskeletal:     Comments: Right arm is swollen Dialysis ongoing-- unable to assess for bruit or thrill   Skin:    General: Skin is warm.  Neurological:     Mental Status: She is alert.     Imaging: ECHOCARDIOGRAM COMPLETE  Result Date: 05/29/2023    ECHOCARDIOGRAM REPORT   Patient Name:   [name] Date of Exam: 05/29/2023 Medical Rec #:  [id number]     Height:       61.0 in Accession #:    [id number]    Weight:       120.6 lb Date of Birth:  31-Jan-1981      BSA:          1.523 m Patient Age:    42 years      BP:           123/55 mmHg Patient Gender: F             HR:           70 bpm. Exam Location:  Inpatient Procedure: 2D Echo, Cardiac Doppler and Color Doppler Indications:    Stroke I63.9  History:        Patient has prior  history of Echocardiogram examinations, most                 recent 07/24/2022. CAD, PAD and Stroke; Risk                 Factors:Hypertension, Diabetes, Dyslipidemia and Non-Smoker.  Sonographer:    Dondra Prader RVT RCS Referring Phys: [id number] ASHISH ARORA  Sonographer Comments: Technically challenging due to wound, patient position and lack of cooperation. Patient appeared to be agitated. IMPRESSIONS  1. Left ventricular ejection fraction, by estimation, is 25 to 30%. The left ventricle has severely decreased function. The left ventricle demonstrates regional wall motion abnormalities (see scoring diagram/findings for description). There is moderate asymmetric left ventricular hypertrophy of the septal segment. Indeterminate diastolic filling due to E-A fusion.  2. Right ventricular systolic function is moderately reduced. The right ventricular size is mildly enlarged.  3. Catheter tip noted in the RA.  4. The mitral valve is degenerative. Mild to moderate mitral valve regurgitation. No evidence of mitral stenosis.  5. Tricuspid valve regurgitation is moderate to severe.  6. Focal calcification of the NCC noted. The aortic valve is tricuspid. Aortic valve regurgitation is not visualized. Aortic valve sclerosis is present, with no evidence of aortic valve stenosis. Comparison(s): Changes from prior study are noted. The left ventricular function is significantly worse. LVEF now severely reduced with  of Transportation (Medical): No    Lack of Transportation (Non-Medical): No  Physical Activity: Not on file  Stress: Not on file  Social Connections: Not on file    Review of Systems: A 12 point ROS discussed and pertinent positives are indicated in the HPI above.  All other systems are negative.  Vital Signs: BP 101/74   Pulse 78   Temp 98 F (36.7 C) (Oral)   Resp 10   Wt 100 lb 5 oz (45.5 kg)   SpO2 98%   BMI 18.95 kg/m     Physical Exam Vitals reviewed.  HENT:     Mouth/Throat:     Mouth: Mucous membranes are moist.  Cardiovascular:     Rate and Rhythm: Normal rate and regular rhythm.  Pulmonary:     Breath sounds: Normal breath sounds. No wheezing.  Abdominal:     Palpations: Abdomen is soft.  Musculoskeletal:     Comments: Right arm is swollen Dialysis ongoing-- unable to assess for bruit or thrill   Skin:    General: Skin is warm.  Neurological:     Mental Status: She is alert.     Imaging: ECHOCARDIOGRAM COMPLETE  Result Date: 05/29/2023    ECHOCARDIOGRAM REPORT   Patient Name:   [name] Date of Exam: 05/29/2023 Medical Rec #:  [id number]     Height:       61.0 in Accession #:    [id number]    Weight:       120.6 lb Date of Birth:  31-Jan-1981      BSA:          1.523 m Patient Age:    42 years      BP:           123/55 mmHg Patient Gender: F             HR:           70 bpm. Exam Location:  Inpatient Procedure: 2D Echo, Cardiac Doppler and Color Doppler Indications:    Stroke I63.9  History:        Patient has prior  history of Echocardiogram examinations, most                 recent 07/24/2022. CAD, PAD and Stroke; Risk                 Factors:Hypertension, Diabetes, Dyslipidemia and Non-Smoker.  Sonographer:    Dondra Prader RVT RCS Referring Phys: [id number] ASHISH ARORA  Sonographer Comments: Technically challenging due to wound, patient position and lack of cooperation. Patient appeared to be agitated. IMPRESSIONS  1. Left ventricular ejection fraction, by estimation, is 25 to 30%. The left ventricle has severely decreased function. The left ventricle demonstrates regional wall motion abnormalities (see scoring diagram/findings for description). There is moderate asymmetric left ventricular hypertrophy of the septal segment. Indeterminate diastolic filling due to E-A fusion.  2. Right ventricular systolic function is moderately reduced. The right ventricular size is mildly enlarged.  3. Catheter tip noted in the RA.  4. The mitral valve is degenerative. Mild to moderate mitral valve regurgitation. No evidence of mitral stenosis.  5. Tricuspid valve regurgitation is moderate to severe.  6. Focal calcification of the NCC noted. The aortic valve is tricuspid. Aortic valve regurgitation is not visualized. Aortic valve sclerosis is present, with no evidence of aortic valve stenosis. Comparison(s): Changes from prior study are noted. The left ventricular function is significantly worse. LVEF now severely reduced with  of Transportation (Medical): No    Lack of Transportation (Non-Medical): No  Physical Activity: Not on file  Stress: Not on file  Social Connections: Not on file    Review of Systems: A 12 point ROS discussed and pertinent positives are indicated in the HPI above.  All other systems are negative.  Vital Signs: BP 101/74   Pulse 78   Temp 98 F (36.7 C) (Oral)   Resp 10   Wt 100 lb 5 oz (45.5 kg)   SpO2 98%   BMI 18.95 kg/m     Physical Exam Vitals reviewed.  HENT:     Mouth/Throat:     Mouth: Mucous membranes are moist.  Cardiovascular:     Rate and Rhythm: Normal rate and regular rhythm.  Pulmonary:     Breath sounds: Normal breath sounds. No wheezing.  Abdominal:     Palpations: Abdomen is soft.  Musculoskeletal:     Comments: Right arm is swollen Dialysis ongoing-- unable to assess for bruit or thrill   Skin:    General: Skin is warm.  Neurological:     Mental Status: She is alert.     Imaging: ECHOCARDIOGRAM COMPLETE  Result Date: 05/29/2023    ECHOCARDIOGRAM REPORT   Patient Name:   [name] Date of Exam: 05/29/2023 Medical Rec #:  [id number]     Height:       61.0 in Accession #:    [id number]    Weight:       120.6 lb Date of Birth:  31-Jan-1981      BSA:          1.523 m Patient Age:    42 years      BP:           123/55 mmHg Patient Gender: F             HR:           70 bpm. Exam Location:  Inpatient Procedure: 2D Echo, Cardiac Doppler and Color Doppler Indications:    Stroke I63.9  History:        Patient has prior  history of Echocardiogram examinations, most                 recent 07/24/2022. CAD, PAD and Stroke; Risk                 Factors:Hypertension, Diabetes, Dyslipidemia and Non-Smoker.  Sonographer:    Dondra Prader RVT RCS Referring Phys: [id number] ASHISH ARORA  Sonographer Comments: Technically challenging due to wound, patient position and lack of cooperation. Patient appeared to be agitated. IMPRESSIONS  1. Left ventricular ejection fraction, by estimation, is 25 to 30%. The left ventricle has severely decreased function. The left ventricle demonstrates regional wall motion abnormalities (see scoring diagram/findings for description). There is moderate asymmetric left ventricular hypertrophy of the septal segment. Indeterminate diastolic filling due to E-A fusion.  2. Right ventricular systolic function is moderately reduced. The right ventricular size is mildly enlarged.  3. Catheter tip noted in the RA.  4. The mitral valve is degenerative. Mild to moderate mitral valve regurgitation. No evidence of mitral stenosis.  5. Tricuspid valve regurgitation is moderate to severe.  6. Focal calcification of the NCC noted. The aortic valve is tricuspid. Aortic valve regurgitation is not visualized. Aortic valve sclerosis is present, with no evidence of aortic valve stenosis. Comparison(s): Changes from prior study are noted. The left ventricular function is significantly worse. LVEF now severely reduced with

## 2023-06-03 NOTE — Progress Notes (Addendum)
Subjective: Seen in room, complaints of some abdominal discomfort, then does ask for  for breakfast tray states feels hungry.  HD today on schedule .  Denies shortness of breath chest pain  Objective Vital signs in last 24 hours: Vitals:   06/03/23 0145 06/03/23 0500 06/03/23 0508 06/03/23 0714  BP: 92/66  109/67 129/78  Pulse:   72 77  Resp: 16  16 16   Temp: 97.6 F (36.4 C)  97.6 F (36.4 C) 98 F (36.7 C)  TempSrc: Axillary  Axillary Oral  SpO2: 97%  100% 99%  Weight:  45.5 kg     Weight change: -0.5 kg  Physical Exam: General: Alert chronically ill young female NAD Heart: RRR no MRG Lungs: CTA bilaterally nonlabored breathing Abdomen: NABS nondistended soft Extremities: Bil AKA no edema Dialysis Access: RUA  AVF + bruit  Dialysis Orders: [facility] MWF 80 NR, 400/1.5 auto, 2K/2Ca, EDW 49kg, mircera 75 q2wks    Problem/Plan: 1. Shock: presumed septic, now off pressors, on midodrine, on broad spectrum abx cefepime and vancomycin. Suspected related to abd wound.  2. 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 Necrotic tissue noted in wound and around ileostomy.  WC and surgery following.   3. 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  CTA completed. EEG with no seizure like activity. Per Neuro.  4. ESRD - MWF schedule dialysis today, labs pending today.  5.HTN/volume - Hypotensive, on midodrine. Volume status improved.  UF as tolerated. 6. Anemia of CKD- Hgb up to 9.1. s/p 1 unit pRBC. Continue ESA.  Labs pending today 7. Secondary hyperparathyroidism - Calcium Correct elevated  and phos in goal.  Labs pending today Senispar on hold d/t GI issues.  Not on VDRA. Continue Fosrenol.  8. Nutrition - Alb low.  ON regular diet.  Monitor labs.    Lenny Pastel, PA-C [facility] Beeper [phone] 06/03/2023,8:40 AM  LOS: 7 days   Labs: Basic Metabolic Panel: Recent Labs  Lab 05/28/23 0403 05/29/23 0505 05/29/23 0733 05/30/23 0426  NA  129* 128* 128* 129*  K 3.1* 5.4* 5.2* 3.9  CL 94*  --  97* 93*  CO2 19*  --  18* 22  GLUCOSE 308*  --  130* 151*  BUN 21*  --  28* 14  CREATININE 3.78*  --  4.15* 2.76*  CALCIUM 10.1  --  10.8* 9.9  PHOS 4.0  --  5.8* 3.9   Liver Function Tests: Recent Labs  Lab 05/27/23 1510 05/29/23 0733 05/30/23 0426  AST 59*  --   --   ALT 25  --   --   ALKPHOS 124  --   --   BILITOT 1.0  --   --   PROT 7.5  --   --   ALBUMIN 2.4* 1.9* 2.0*   No results for input(s): "LIPASE", "AMYLASE" in the last 168 hours. No results for input(s): "AMMONIA" in the last 168 hours. CBC: Recent Labs  Lab 05/27/23 1510 05/27/23 1511 05/28/23 0403 05/29/23 0505 05/29/23 0733 05/29/23 1610 05/30/23 0426  WBC 12.5*  --  27.9*  --  16.6*  --  16.1*  NEUTROABS 10.6*  --   --   --   --   --   --   HGB 7.5*   < > 7.1*   < > 6.6* 8.9* 9.1*  HCT 25.3*   < > 23.7*   < > 21.4* 27.2* 27.0*  MCV 88.8  --  88.4  --  86.6  --  87.9  PLT 316  --  416*  --  308  --  231   < > = values in this interval not displayed.   Cardiac Enzymes: No results for input(s): "CKTOTAL", "CKMB", "CKMBINDEX", "TROPONINI" in the last 168 hours. CBG: Recent Labs  Lab 06/01/23 1825 06/01/23 2148 06/02/23 0848 06/02/23 1205 06/02/23 2137  GLUCAP 109* 83 84 89 109*    Studies/Results: No results found. Medications:  vancomycin      atorvastatin  20 mg Oral Daily   Chlorhexidine Gluconate Cloth  6 each Topical Daily   Chlorhexidine Gluconate Cloth  6 each Topical Q0600   Chlorhexidine Gluconate Cloth  6 each Topical Q0600   darbepoetin (ARANESP) injection - DIALYSIS  60 mcg Subcutaneous Q Fri-1800   dronabinol  5 mg Oral BID AC   heparin  5,000 Units Subcutaneous Q8H   insulin aspart  0-9 Units Subcutaneous TID WC   LORazepam  1 mg Intravenous Once   midodrine  10 mg Oral TID WC   sodium hypochlorite   Irrigation BID    I have personally seen and examined this patient and agree with the assessment/plan as outlined  above by Lenny Pastel PA-C. Patient seen and examined on dialysis. Tolerating treatment. Discussed with HD RN and there are some issues with her access, difficult cannulation, faint bruit, BFR currently at 300. Will watch for now and if any further issues then will need to have a fistulogram while she's here.  Mulan Adan,MD 06/03/2023 10:41 AM

## 2023-06-04 DIAGNOSIS — A419 Sepsis, unspecified organism: Secondary | ICD-10-CM | POA: Diagnosis not present

## 2023-06-04 DIAGNOSIS — R6521 Severe sepsis with septic shock: Secondary | ICD-10-CM | POA: Diagnosis not present

## 2023-06-04 LAB — GLUCOSE, CAPILLARY
Glucose-Capillary: 138 mg/dL — ABNORMAL HIGH (ref 70–99)
Glucose-Capillary: 68 mg/dL — ABNORMAL LOW (ref 70–99)
Glucose-Capillary: 81 mg/dL (ref 70–99)
Glucose-Capillary: 143 mg/dL — ABNORMAL HIGH (ref 70–99)

## 2023-06-04 MED ORDER — GLUCOSE 40 % PO GEL
1.0000 | Freq: Once | ORAL | Status: AC
  Administered 2023-06-04: 31 g via ORAL

## 2023-06-04 MED ORDER — GLUCOSE 40 % PO GEL
ORAL | Status: AC
  Administered 2023-06-04: 37.5 g
  Filled 2023-06-04: qty 1.21

## 2023-06-04 MED ORDER — DEXTROSE 50 % IV SOLN: 12.5000 g | INTRAVENOUS | Status: AC

## 2023-06-04 NOTE — Progress Notes (Signed)
   Medical records reviewed including progress notes, labs, and imaging. Patient continues to be unwilling/unable to interact meaningfully enough for GOC conversation. PMT and primary teams have been unable to contact [facility]CPOA for assistance throughout her hospitalization.   Agree with possible ethics consult for further guidance. PMT will continue to follow peripherally at this time.   Thank you for your referral and allowing PMT to assist in Mrs. [employer] care.   Richardson Dopp, [facility] Palliative Medicine Team  Team Phone # [phone]   NO CHARGE

## 2023-06-04 NOTE — Plan of Care (Signed)
  Problem: Activity: Goal: Ability to perform//tolerate increased activity and mobilize with assistive devices will improve Outcome: Not Applicable   Problem: Pain Management: Goal: Pain level will decrease with appropriate interventions Outcome: Progressing   Problem: Nutrition: Goal: Adequate nutrition will be maintained Outcome: Progressing   Problem: Nutritional: Goal: Maintenance of adequate nutrition will improve Outcome: Progressing

## 2023-06-04 NOTE — Plan of Care (Signed)
  Problem: Pain Management: Goal: Pain level will decrease with appropriate interventions Outcome: Progressing   Problem: Skin Integrity: Goal: Demonstration of wound healing without infection will improve Outcome: Progressing   Problem: Activity: Goal: Risk for activity intolerance will decrease Outcome: Progressing   Problem: Nutrition: Goal: Adequate nutrition will be maintained Outcome: Progressing   Problem: Pain Managment: Goal: General experience of comfort will improve Outcome: Progressing   Problem: Safety: Goal: Ability to remain free from injury will improve Outcome: Progressing   Problem: Skin Integrity: Goal: Risk for impaired skin integrity will decrease Outcome: Progressing

## 2023-06-04 NOTE — Progress Notes (Signed)
Progress Note     Subjective: States she is hurting everywhere.   Per chart review she is going down for evaluation of her HD fistula by IR today.  AFVSS this morning - BP 96/64 Objective: Vital signs in last 24 hours: Temp:  [97.8 F (36.6 C)-98.9 F (37.2 C)] 98.9 F (37.2 C) (10/22 1610) Pulse Rate:  [68-82] 74 (10/22 0613) Resp:  [8-17] 17 (10/22 9604) BP: (96-143)/(67-88) 133/77 (10/22 0613) SpO2:  [97 %-100 %] 100 % (10/22 0613) Weight:  [45 kg] 45 kg (10/22 0700) Last BM Date : 06/03/23  Intake/Output from previous day: 10/21 0701 - 10/22 0700 In: -  Out: 500  Intake/Output this shift: No intake/output data recorded.  PE: No acute distress Midline wound as below: fat necroses of central portion of wound with interval increase in viable pink tissue. There is a layer of fribinous exudate over fascial sutures. No evidence of pseudomonas. Stoma viable. Ileostomy pouch is leaking semi solid stool.    Lab Results:  Recent Labs    06/03/23 0955  WBC 12.1*  HGB 10.6*  HCT 33.7*  PLT 210   BMET Recent Labs    06/03/23 0955  NA 136  K 4.1  CL 100  CO2 23  GLUCOSE 112*  BUN 27*  CREATININE 4.50*  CALCIUM 9.5   PT/INR No results for input(s): "LABPROT", "INR" in the last 72 hours. CMP     Component Value Date/Time   NA 136 06/03/2023 0955   NA 133 (L) 05/18/2020 0927   K 4.1 06/03/2023 0955   CL 100 06/03/2023 0955   CO2 23 06/03/2023 0955   GLUCOSE 112 (H) 06/03/2023 0955   BUN 27 (H) 06/03/2023 0955   BUN 45 (H) 05/18/2020 0927   CREATININE 4.50 (H) 06/03/2023 0955   CALCIUM 9.5 06/03/2023 0955   CALCIUM 12.1 (H) 11/13/2021 2026   PROT 6.0 (L) 06/03/2023 0955   PROT 7.6 05/18/2020 0927   ALBUMIN 1.8 (L) 06/03/2023 0955   ALBUMIN 3.9 05/18/2020 0927   AST 14 (L) 06/03/2023 0955   ALT 21 06/03/2023 0955   ALKPHOS 81 06/03/2023 0955   BILITOT 1.1 06/03/2023 0955   BILITOT <0.2 05/18/2020 0927   GFRNONAA 12 (L) 06/03/2023 0955   GFRAA 6  (L) 05/18/2020 0927   Lipase     Component Value Date/Time   LIPASE 55 (H) 05/25/2023 1159       Studies/Results: No results found.  Anti-infectives: Anti-infectives (From admission, onward)    Start     Dose/Rate Route Frequency Ordered Stop   05/31/23 2000  vancomycin (VANCOREADY) IVPB 500 mg/100 mL        500 mg 100 mL/hr over 60 Minutes Intravenous Once per day on Monday Wednesday Friday 05/31/23 1133 06/03/23 1706   05/29/23 1200  vancomycin (VANCOREADY) IVPB 500 mg/100 mL  Status:  Discontinued        500 mg 100 mL/hr over 60 Minutes Intravenous Every M-W-F (Hemodialysis) 05/28/23 0924 05/31/23 1133   05/28/23 1500  ceFEPIme (MAXIPIME) 2 g in sodium chloride 0.9 % 100 mL IVPB  Status:  Discontinued        2 g 200 mL/hr over 30 Minutes Intravenous Every 24 hours 05/28/23 0921 05/28/23 0922   05/28/23 1500  ceFEPIme (MAXIPIME) 1 g in sodium chloride 0.9 % 100 mL IVPB        1 g 200 mL/hr over 30 Minutes Intravenous Every 24 hours 05/28/23 0922 06/02/23 1716   05/28/23 0600  metroNIDAZOLE (FLAGYL) IVPB 500 mg  Status:  Discontinued        500 mg 100 mL/hr over 60 Minutes Intravenous Every 12 hours 05/28/23 0104 05/29/23 0847   05/27/23 1600  vancomycin (VANCOCIN) IVPB 1000 mg/200 mL premix        1,000 mg 200 mL/hr over 60 Minutes Intravenous  Once 05/27/23 1508 05/27/23 1933   05/27/23 1515  ceFEPIme (MAXIPIME) 1 g in sodium chloride 0.9 % 100 mL IVPB        1 g 200 mL/hr over 30 Minutes Intravenous  Once 05/27/23 1508 05/27/23 1558   05/27/23 1515  metroNIDAZOLE (FLAGYL) IVPB 500 mg        500 mg 100 mL/hr over 60 Minutes Intravenous  Once 05/27/23 1508 05/27/23 1933        Assessment/Plan Small bowel ischemia with SBO  POD#31 s/p diagnostic lap converted to ex-lap with extended ileo-cecectomy (mid-ileum to just past cecum), 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 Hillery Hunter Necrotic tissue at midline wound and around ileostomy - appreciate WOC assistance - Eakin's to be  re-pouched this AM. - concern for pseudomonas in midline resolved - moist-to-dry dressing changes. D/C dakins. - document ileostomy output and will titrate antimotility agents accordingly - recommend re-engaging palliative for GOC     FEN:  Renal diet, Glucerna ID: Rocephin/Flagyl 9/21 > 9/28 VTE: SCDs, SQH q 8h  - per CCM -  Septic shock - no intraabdominal source of infection on imaging, ?aspiration on CT Possible acute neurologic infarct - neuro following  IDDM ESD on HD Bilateral AKA Blind Severe peripheral arterial disease  LOS: 8 days     Adam Phenix, [facility] Surgery 06/04/2023, 8:31 AM Please see Amion for pager number during day hours 7:00am-4:30pm

## 2023-06-04 NOTE — Progress Notes (Signed)
PROGRESS NOTE    [name]  VHQ:[id number] DOB: 1981-06-14 DOA: 06/11/2023 PCP: [facility]   Brief Narrative:  [name] is a 42 year old woman with insulin-dependent diabetes mellitus, resultant end-stage renal disease, bilateral AKA, blindness and severe peripheral arterial disease.  She presents hypotensive from dialysis today.  She had a recent prolonged hospital stay in September 2024 for small bowel ischemia with subsequent small bowel obstruction.  She had an extended ileal cecotomy. Ultimately discharged 3 days prior to admission to SNF for ongoing care/therapy.  Of note her AV fistula was unable to be accessed due to pseudoaneurysm and repeated clotting at the time of admission.  She thus has a dialysis catheter placed in the right chest.  There is report that she was not adherent to dressing changes for her open abdominal wound, and did not agree to wound VAC.  She was found to be hypotensive in the ED and started on 2 vasopressors.  PCCM consulted for admission.  06/05/2023 admitted to the hospital with hypotension after dialysis concern for septic shock 05/29/2023 tolerated HD via fistula, weaning off pressors 10/18 - transferred to [facility] service - remains hypotensive - fluid challenge ongoing - HD successful  10/20 - Hypotensive but asymptomatic - unable to get in touch with patient's healthcare power of attorney(Sister [name]) in regards to goals of care, palliative consulted 10/21 tolerated HD well today - continues to be noncompliant with interview/evaluation/medication/care 10/22 Delay in Rt arm fistula eval/intervention due to mental status changes - unable to consent to procedure. POA remains unobtainable - may require 2 physician evaluation if need for fistula management becomes more urgent/emergent.  Assessment & Plan:   Principal Problem:   Septic shock (HCC)  Goals of care discussion  -Attempted to discuss patient's case with her  healthcare power of attorney [name], her sister, however she is unavailable by the phone number listed.  Patient's husband and family have no other ways to contact patient's healthcare power of attorney.   Ethics formally consulted on how to proceed given clearly stated healthcare power of attorney who is otherwise unavailable -appears husband is estranged(living in a different city hours away). -Discussed patient's severe illness with her husband and his family over the phone on21st, they are not local and are unable to be in the hospital at this time.  Patient is full code at this time, it is unclear if patient has discussed her CODE STATUS with her power of attorney previously. Patient's living will, while it does name a power of attorney it does not specify any advanced directives in regards to patient's wishes if she were to decompensate.  Septic Shock secondary to suspected wound/skin/soft tissue infection, POA Acute metabolic encephalopathy -Continue Vancomycin cefepime plan 7 days, flagyl d/c'd 10/16   -Blood pressure finally normalizing with dialysis/improved medication compliance -Patient educated on need for compliance while taking vitals/taking medication -Continue midodrine - patient previously noncompliant with his medication likely exacerbating her hypotension - Appreciate surgery assistance postoperatively - Palliative care consulted with severe comorbid chronic organ failure and refusing or fighting wound care/medication administration/general care -she remains high risk for decompensation given her profound noncompliance and nonadherence to medication, physical exam, and treatment.  She remains full code.   End-stage renal disease, likely diabetic nephropathy -Temporary HD catheter removed -Appreciate nephrology assistance -Continue dialysis per nephrology -Monday Wednesday Friday -Fistula planned to be evaluated today by radiology, complicated in the setting of missing power of  attorney, patient unable to make any medical  decisions at this point given her confusion.  Will need two-physician documentation for evaluation and intervention should this become more urgent or emergent.  I would defer to radiology and nephrology for their expertise and insight in regards to her fistula.   Type 2 diabetes Severe peripheral or vascular disease Recent small bowel ischemia Bilateral AKA -Semglee, sliding scale insulin   Punctate acute infarct R frontal love, acute to subacute infarct right parieto occipital infarct: --Appreciate neurology assistance, stroke team to follow --EEG without seizures, dysfunction likely at site of stroke  DVT prophylaxis: heparin injection 5,000 Units Start: 06/05/2023 2200 Code Status:   Code Status: Full Code Family Communication: None present  Status is: Inpatient Dispo: The patient is from: Facility              Anticipated d/c is to: Same              Anticipated d/c date is: unknown              Patient currently not medically stable for discharge  Consultants:  General surgery, PCCM, nephrology, palliative, ethics  Antimicrobials:  Cefepime, vancomycin  Subjective: No acute issues or events reported overnight, patient not willing to interact in any meaningful way again today;   Objective: Vitals:   06/03/23 1530 06/03/23 2014 06/04/23 0613 06/04/23 0700  BP: (!) 140/83 120/76 133/77   Pulse: 79 78 74   Resp: 16 15 17    Temp: 97.8 F (36.6 C) 98 F (36.7 C) 98.9 F (37.2 C)   TempSrc: Oral Oral Oral   SpO2:  98% 100%   Weight:    45 kg    Intake/Output Summary (Last 24 hours) at 06/04/2023 0823 Last data filed at 06/03/2023 1304 Gross per 24 hour  Intake --  Output 500 ml  Net -500 ml   Filed Weights   06/02/23 0500 06/03/23 0500 06/04/23 0700  Weight: 46 kg 45.5 kg 45 kg    Examination:  General:  Pleasantly resting in bed, No acute distress. HEENT:  Normocephalic atraumatic.  Sclerae nonicteric, noninjected.   Extraocular movements intact bilaterally. Neck:  Without mass or deformity.  Trachea is midline. Lungs: Unable to evaluate per patient refusal Heart:  Unable to evaluate per patient refusal Abdomen: Ostomy bag noted Extremities: Unable to evaluate per patient refusal - Bilateral AKA noted Skin: Extremely limited exam given patient refusal, no obvious signs or symptoms of bleeding or skin breakdown over distal upper extremities  Data Reviewed: I have personally reviewed following labs and imaging studies  CBC: Recent Labs  Lab 05/29/23 0505 05/29/23 0733 05/29/23 1610 05/30/23 0426 06/03/23 0955  WBC  --  16.6*  --  16.1* 12.1*  HGB 7.8* 6.6* 8.9* 9.1* 10.6*  HCT 23.0* 21.4* 27.2* 27.0* 33.7*  MCV  --  86.6  --  87.9 96.0  PLT  --  308  --  231 210   Basic Metabolic Panel: Recent Labs  Lab 05/29/23 0505 05/29/23 0733 05/30/23 0426 06/03/23 0955  NA 128* 128* 129* 136  K 5.4* 5.2* 3.9 4.1  CL  --  97* 93* 100  CO2  --  18* 22 23  GLUCOSE  --  130* 151* 112*  BUN  --  28* 14 27*  CREATININE  --  4.15* 2.76* 4.50*  CALCIUM  --  10.8* 9.9 9.5  PHOS  --  5.8* 3.9  --    GFR: Estimated Creatinine Clearance: 11.7 mL/min (A) (by C-G formula based on SCr  of 4.5 mg/dL (H)).  Liver Function Tests: Recent Labs  Lab 05/29/23 0733 05/30/23 0426 06/03/23 0955  AST  --   --  14*  ALT  --   --  21  ALKPHOS  --   --  81  BILITOT  --   --  1.1  PROT  --   --  6.0*  ALBUMIN 1.9* 2.0* 1.8*   CBG: Recent Labs  Lab 06/02/23 1609 06/02/23 2137 06/03/23 0828 06/03/23 1703 06/03/23 2304  GLUCAP 115* 109* 120* 93 103*    Sepsis Labs: Recent Labs  Lab 05/28/23 0920  LATICACIDVEN 2.2*    Recent Results (from the past 240 hour(s))  Blood culture (routine x 2)     Status: None   Collection Time: 05/25/23 11:04 AM   Specimen: BLOOD  Result Value Ref Range Status   Specimen Description BLOOD LEFT ANTECUBITAL  Final   Special Requests   Final    BOTTLES DRAWN AEROBIC  ONLY Blood Culture results may not be optimal due to an inadequate volume of blood received in culture bottles   Culture   Final    NO GROWTH 5 DAYS Performed at [facility], [address], [place], [place] [address]    Report Status 05/30/2023 FINAL  Final  Blood culture (routine x 2)     Status: None   Collection Time: 05/25/23 11:09 AM   Specimen: BLOOD LEFT HAND  Result Value Ref Range Status   Specimen Description BLOOD LEFT HAND  Final   Special Requests   Final    BOTTLES DRAWN AEROBIC ONLY Blood Culture results may not be optimal due to an inadequate volume of blood received in culture bottles   Culture   Final    NO GROWTH 5 DAYS Performed at [facility], [address] [place], [place] [address]    Report Status 05/30/2023 FINAL  Final  Blood culture (routine x 2)     Status: None   Collection Time: 05/16/2023  3:10 PM   Specimen: BLOOD LEFT ARM  Result Value Ref Range Status   Specimen Description BLOOD LEFT ARM  Final   Special Requests   Final    BOTTLES DRAWN AEROBIC AND ANAEROBIC Blood Culture adequate volume   Culture   Final    NO GROWTH 5 DAYS Performed at [facility], [address] [place], [place] [address]    Report Status 06/01/2023 FINAL  Final  Blood culture (routine x 2)     Status: None   Collection Time: 06/12/2023  3:27 PM   Specimen: BLOOD LEFT ARM  Result Value Ref Range Status   Specimen Description BLOOD LEFT ARM  Final   Special Requests   Final    BOTTLES DRAWN AEROBIC AND ANAEROBIC Blood Culture adequate volume   Culture   Final    NO GROWTH 5 DAYS Performed at [facility], [address], [place], [place] [address]    Report Status 06/01/2023 FINAL  Final  MRSA Next Gen by PCR, Nasal     Status: None   Collection Time: 05/29/2023  9:21 PM   Specimen: Nasal Mucosa; Nasal Swab  Result Value Ref Range Status   MRSA by PCR Next Gen NOT DETECTED NOT DETECTED Final    Comment: (NOTE) The GeneXpert MRSA Assay (FDA  approved for NASAL specimens only), is one component of a comprehensive MRSA colonization surveillance program. It is not intended to diagnose MRSA infection nor  to guide or monitor treatment for MRSA infections. Test performance is not FDA approved in patients less than 33 years old. Performed at [facility], [address] [place], [place] [address]      Radiology Studies: No results found.  Scheduled Meds:  atorvastatin  20 mg Oral Daily   Chlorhexidine Gluconate Cloth  6 each Topical Daily   Chlorhexidine Gluconate Cloth  6 each Topical Q0600   Chlorhexidine Gluconate Cloth  6 each Topical Q0600   darbepoetin (ARANESP) injection - DIALYSIS  60 mcg Subcutaneous Q Fri-1800   dronabinol  5 mg Oral BID AC   heparin  5,000 Units Subcutaneous Q8H   insulin aspart  0-9 Units Subcutaneous TID WC   LORazepam  1 mg Intravenous Once   midodrine  10 mg Oral TID WC   sodium hypochlorite   Irrigation BID   Continuous Infusions:     LOS: 8 days   Time spent:  Azucena Fallen, DO [facility]  If 7PM-7AM, please contact night-coverage [email or web address]  06/04/2023, 8:23 AM

## 2023-06-04 NOTE — Progress Notes (Signed)
Pt started on hypoglycemic protocol. Notified MD and charge RN. Blood sugar is 68, administer glucose gel. Resume patient diet. Will recheck blood sugar in 30 minutes.

## 2023-06-04 NOTE — Progress Notes (Signed)
   IR Note  Pt was tentatively scheduled for Rt arm dialysis fistula evaluation and possible intervention.  Examined pt yesterday in dialysis- she was asleep and groggy and not able to answer questions..Pt cannot consent for self  I have called her sister [name] Programmer, multimedia-- many times and unable to reach her. I did speak to husband but he is not designated POA  I re examined pt today Still unable to answer questions appropriately Unable to consent for self  We would need MDs to deem urgent or emergent if feel appropriate, and place notes regarding same into chart--- consenting for pt to have procedure to be able to proceed   Thank you Contact IR if we are able to move ahead

## 2023-06-04 NOTE — Consult Note (Signed)
WOC Nurse ostomy follow up Stoma type/location: RMQ ileostomy Stomal assessment/size: 1" flush stoma Peristomal assessment:   Midline incision. Devitalized tissue to peristomal skin from 5 o'clock to 12 o'clock.  Effluent leaking onto skin, resulting in breakdown.  Patient refuses care at times.  She is premedicated today for pain and I explain that we must change the pouch to heal the painful skin and keep stool out of her midline incision.  She agrees.  Treatment options for stomal/peristomal skin: Stoma powder and skin prep to peristomal skin.  To intact and nonintact skin.  The nonintact skin is covered with an Eakin seal (much larger ring) to protect and promote pouch seal.  Eakin pouch opening is cut off center to avoid midline incision and keep the two separate.  A new pattern is left at bedside along with an extra Eakin seal.  Bedside RN indicates she has ordered supplies.  None are present yet.   Output seedy, soft yellow stool Ostomy pouching: 1pc. Eakin  Stoma powder/skin prep and Eakin seal  Connected to wall suction to keep stool diverted away from midline incision and keep pouch seal.  Patient prefers to lie on her right side most of the time.  Any stool that collects in pouch diverts to that side and threatens a secure seal.  Hopefully suction will prevent this, keep pouch intact and promote peristomal skin healing.  Education provided: Patient agrees to pouch change after much discussion. Understands that we must keep stool off of her skin for healing.  Once I replace the pouch, she refuses to let me remove the soiled underpad where pouch had leaked. I cleansed the skin and placed a clean towel between skin and soiled underpad.  She has a pillow under her left hip to offload pressure.  She refuses regular turning and repositioning, understanding the risk of additional skin breakdown.   Enrolled patient in [facility] [employer] Start Discharge program: Yes previously.   Will follow.  Mike Gip  MSN, RN, FNP-BC CWON Wound, Ostomy, Continence Nurse Outpatient [facility] [phone] Pager [phone]

## 2023-06-04 NOTE — Progress Notes (Signed)
Patient blood sugar 81 after glucose gel. Pt is eating at this time. Will continue to monitor pt blood sugar.

## 2023-06-04 NOTE — Consult Note (Signed)
Ethics CONSULT NOTE  Patient ID: [name], MRN: [id number], DOB/AGE: [birth year] 42 y.o. Admit date: 06/10/2023 Date of Consult: 06/04/2023  Primary Physician: [facility]   [name] is a 42 y.o. female who is being seen today for the evaluation of surrogate decision making at the request of Dr Natale Milch.     HPI [name] is a 42 y.o. female admitted 10/14 with a end-stage renal disease secondary to diabetes, bilateral AKA, peripheral vascular disease and blindness and a diagnosis of septic shock presenting from dialysis with hypotension 3 days following discharge for Prolonged hospitalization 9/24 for small bowel ischemia with small bowel obstruction requiring a cecotomy w ileostomy Dialysis was by way of a catheter right chest reportedly and adequately dressed and the note on admission describes "did not agree to wound VAC "  Full thickness wound   CT scan   could represent laminar necrosis, extensive cortically-based petechial hemorrhage, hemorrhagic posterior reversible encephalopathy syndrome MRI is reviewed by Dr. Roda Shutters felt most consistent with laminar necrosis from ischemia or PRES Supportive care with pressors and then midodrine to be added stress steroids added Found to have severe LV dysfunction 12/23 normal LV function with two-vessel coronary disease small vessels with medical therapy  From palliative care consult note 05/29/23 Palliative care attempted to reach husband but was unable to reach but apparently the patient was conversant although her answer was "I am hurt "when asked to describe the understanding of her current situation According to documentation on file,[name] is HCPOA, per the chart this is her sister.  Patient denies knowing who this person is and she indicates preference for her spouse to make decisions 10/16 weaned off of pressors and tolerated hemodialysis 10/18 transferred to the hospitalist service  hypotensive--chart reports "patient continues to be noncompliant with medication " 10/20 "continues to be hypotensive-not tachycardic-unable to get in touch with patient's healthcare power of attorney Today she is aware she is in hospital, aware that she is better.  The patient's care has been discussed with her husband and his family.  They apparently are not local.  There is question regarding who has the power to make decisions given the inability to be in touch with a patient's healthcare power of attorney previously designated.  When asked by me whether she thought she could make her medical decisions on her own she said no.  When asked who she would like to help make  her medical decisions she said her husband and correctly named him [name]  Past Medical History:  Diagnosis Date   Anemia    Anxiety    Blind right eye 2008   Diabetes mellitus without complication (HCC)    Embolic stroke (HCC)    ESRD (end stage renal disease) (HCC)    HD on M/W/F   ESRD on hemodialysis (HCC)    GBS (Guillain Barre syndrome) [facility])        Surgical History:  Past Surgical History:  Procedure Laterality Date   AMPUTATION Right 08/11/2021   Procedure: TRANSMETATARSAL AMPUTATION OF RIGHT FOOT;  Surgeon: Nada Libman, MD;  Location: [facility] OR;  Service: Vascular;  Laterality: Right;   AMPUTATION Right 09/17/2021   Procedure: AMPUTATION RIGHT BELOW KNEE;  Surgeon: Nada Libman, MD;  Location: [facility] OR;  Service: Vascular;  Laterality: Right;   AMPUTATION Right 10/11/2021   Procedure: RIGHT ABOVE KNEE AMPUTATION;  Surgeon: Nada Libman, MD;  Location: [facility] OR;  Service: Vascular;  Laterality: Right;   AMPUTATION Left 07/02/2022   Procedure: LEFT ABOVE KNEE AMPUTATION;  Surgeon: Chuck Hint, MD;  Location: [facility] OR;  Service: Vascular;  Laterality: Left;   AMPUTATION TOE Left    APPLICATION OF WOUND VAC  08/16/2021   Procedure: APPLICATION OF WOUND VAC;  Surgeon: Nada Libman, MD;   Location: [facility] OR;  Service: Vascular;;   AV FISTULA PLACEMENT     BUBBLE STUDY  10/02/2021   Procedure: BUBBLE STUDY;  Surgeon: Pricilla Riffle, MD;  Location: [facility] ENDOSCOPY;  Service: Cardiovascular;;   CESAREAN SECTION  2011   COLOSTOMY Right 05/04/2023   Procedure: COLOSTOMY;  Surgeon: Moise Boring, MD;  Location[facility] OR;  Service: General;  Laterality: Right;   FISTULA SUPERFICIALIZATION Right 07/27/2019   Procedure: 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;  Surgeon: Chuck Hint, MD;  Location: [facility] OR;  Service: Vascular;  Laterality: Right;   INSERTION OF DIALYSIS CATHETER Left 07/27/2019   Procedure: INSERTION OF TUNNELED  DIALYSIS CATHETER;  Surgeon: Chuck Hint, MD;  Location: [facility] OR;  Service: Vascular;  Laterality: Left;   INSERTION OF DIALYSIS CATHETER Left 10/23/2021   Procedure: INSERTION OF TUNNELED PALINDROME PRECISION DIALYSIS CATHETER (23cm);  Surgeon: Cephus Shelling, MD;  Location: [facility] OR;  Service: Vascular;  Laterality: Left;   IR AV DIALY SHUNT INTRO NEEDLE/INTRACATH INITIAL W/PTA/IMG RIGHT Right 05/14/2023   IR FLUORO GUIDE CV LINE LEFT  10/16/2021   IR FLUORO GUIDE CV LINE LEFT  05/10/2023   IR FLUORO GUIDE CV LINE RIGHT  10/11/2021   IR GASTROSTOMY TUBE MOD SED  10/30/2021   IR GASTROSTOMY TUBE REMOVAL  05/08/2022   IR US GUIDE VASC ACCESS LEFT  10/16/2021   IR US GUIDE VASC ACCESS LEFT  05/10/2023   IR US GUIDE VASC ACCESS RIGHT  10/11/2021   IR US GUIDE VASC ACCESS RIGHT  05/14/2023   LAPAROSCOPY N/A 05/04/2023   Procedure: LAPAROSCOPY DIAGNOSTIC;  Surgeon: Moise Boring, MD;  Location: [facility] OR;  Service: General;  Laterality: N/A;   LAPAROTOMY N/A 05/04/2023   Procedure: EXPLORATION LAPAROTOMY WITH ILEO-CAECAL RESECTION;  Surgeon: Moise Boring, MD;  Location: [facility] OR;  Service: General;  Laterality: N/A;   LEFT HEART CATH AND CORONARY ANGIOGRAPHY N/A 07/26/2022   Procedure: LEFT HEART CATH AND CORONARY ANGIOGRAPHY;  Surgeon: [place],  Peter M, MD;  [facility]: [facility] INVASIVE CV LAB;  Service: Cardiovascular;  Laterality: N/A;   LOWER EXTREMITY ANGIOGRAPHY N/A 08/11/2021   Procedure: LOWER EXTREMITY ANGIOGRAPHY;  Surgeon: Nada Libman, MD;  [facility]: [facility] INVASIVE CV LAB;  Service: Cardiovascular;  Laterality: N/A;   REVISON OF ARTERIOVENOUS FISTULA Right 10/23/2021   Procedure: REVISON OF ARTERIOVENOUS FISTULA  ARM AND PLICATION;  Surgeon: Cephus Shelling, MD;  Location: [facility] OR;  Service: Vascular;  Laterality: Right;   TEE WITHOUT CARDIOVERSION N/A 10/02/2021   Procedure: TRANSESOPHAGEAL ECHOCARDIOGRAM (TEE);  Surgeon: Pricilla Riffle, MD;  Location: [facility] ENDOSCOPY;  Service: Cardiovascular;  Laterality: N/A;   TRANSMETATARSAL AMPUTATION Right 08/16/2021   Procedure: CLOSURE OF RIGHT TRANSMETATARSAL AMPUTATION;  Surgeon: Nada Libman, MD;  Location: [facility] OR;  Service: Vascular;  Laterality: Right;      Inpatient Medications:   atorvastatin  20 mg Oral Daily   Chlorhexidine Gluconate Cloth  6 each Topical Daily   Chlorhexidine Gluconate Cloth  6 each Topical Q0600   Chlorhexidine Gluconate Cloth  6 each Topical Q0600   darbepoetin (ARANESP) injection - DIALYSIS  60  mcg Subcutaneous Q Fri-1800   dextrose  12.5 g Intravenous STAT   dronabinol  5 mg Oral BID AC   heparin  5,000 Units Subcutaneous Q8H   insulin aspart  0-9 Units Subcutaneous TID WC   LORazepam  1 mg Intravenous Once   midodrine  10 mg Oral TID WC      Allergies:  Allergies  Allergen Reactions   Morphine Rash and Other (See Comments)    Per patient MAR.   Peanut-Containing Drug Products Anaphylaxis and Hives   Penicillins Rash and Other (See Comments)    Rash in 2008.  Tolerated cefazolin in 2020. No reaction listed on MAR.   Shellfish Allergy Swelling   Chocolate Hives    Social History   Socioeconomic History   Marital status: Single    Spouse name: Not on file   Number of children: Not on file   Years of education: Not on file   Highest  education level: Not on file  Occupational History   Not on file  Tobacco Use   Smoking status: Never   Smokeless tobacco: Never  Vaping Use   Vaping status: Never Used  Substance and Sexual Activity   Alcohol use: No   Drug use: No   Sexual activity: Not on file  Other Topics Concern   Not on file  Social History Narrative   Not on file   Social Determinants of Health   Financial Resource Strain: Not on file  Food Insecurity: No Food Insecurity (04/30/2023)   Hunger Vital Sign    Worried About Running Out of Food in the Last Year: Never true    Ran Out of Food in the Last Year: Never true  Transportation Needs: No Transportation Needs (04/30/2023)   PRAPARE - Administrator, Civil Service (Medical): No    Lack of Transportation (Non-Medical): No  Physical Activity: Not on file  Stress: Not on file  Social Connections: Not on file  Intimate Partner Violence: Not At Risk (04/30/2023)   Humiliation, Afraid, Rape, and Kick questionnaire    Fear of Current or Ex-Partner: No    Emotionally Abused: No    Physically Abused: No    Sexually Abused: No     Family History  Problem Relation Age of Onset   Diabetes Mother    Diabetes Father             Assessment and Plan:  Multiple comorbid conditions in a patient with end-stage renal disease, presentation with septic shock, severe peripheral vascular disease, intercurrent acute neurological injury-laminar necrosis?,  Status post prolonged recent hospitalization with an ileostomy in place .  The ethics question is who has the right to make decisions on this patient's behalf her having designated on her previous document healthcare power of attorney.  Palliative care note suggest that the patient would like her husband to take care of her decisions.   It is unclear to me from the notes whether she is able to make decisions on her own behalf, but if it is felt that she is not, she has the right, if she is competent  even for this decision, to change her healthcare power from her previously written document.  She has reiterated to me that she would like her husband to help her with her medical decisions and that she would not like her help to come from her sister, named Tracey Liz  Regardless of the above , if the healthcare power of attorney is not  available, then surrogate decision making would be referred to kin, and specifically her spouse in first order. As outlined below    See: [facility] Chapter 90. Medicine and Allied Occupations  90-21.13. Informed consent to health care treatment or procedure. In brief summary, this statute outlines that the following persons, in order indicated, are authorized to consent to medical treatment on behalf of the patient who does not demonstrate capacity to do so: Legally assigned guardian appointed by the court > healthcare agent appointed by legal healthcare power of attorney > healthcare agent appointed by the patient > legal spouse > majority of the patient's reasonably available parents and children who are at least 4 years of age > individual who has an established relationship with the patient, who is acting in good faith on behalf of the patient, and who can reliably convey the patient's wishes > attending physician with confirmation by another physician of the patient's condition and the necessity for treatment (unless in urgent/emergent situation)   If we can be of further assistance    Sherryl Manges

## 2023-06-04 NOTE — Social Work (Addendum)
CSW reviewed pt's chart, noting two recent admissions. Pt is LTC at [facility], no barriers to returning. Pt's spouse contact information was updated in the chart earlier on in admission. Pt is currently undergoing medical workup at this time. TOC will continue to follow for DC needs.

## 2023-06-04 NOTE — Progress Notes (Signed)
Subjective: Seen in room said tolerated dialysis yesterday ,noted AV fistula blood flow rate  low for IR fistulogram today.  Only complaint is she is hungry  Objective Vital signs in last 24 hours: Vitals:   06/03/23 2014 06/04/23 0613 06/04/23 0700 06/04/23 0835  BP: 120/76 133/77  96/64  Pulse: 78 74  86  Resp: 15 17  17   Temp: 98 F (36.7 C) 98.9 F (37.2 C)  (!) 97.5 F (36.4 C)  TempSrc: Oral Oral    SpO2: 98% 100%  92%  Weight:   45 kg    Weight change: -0.5 kg   Physical Exam: General: Alert chronically ill young female NAD Heart: RRR no MRG Lungs: CTA bilaterally nonlabored breathing Abdomen: NABS nondistended soft Extremities: Bil AKA no edema Dialysis Access: RUA  AVF + bruit   Dialysis Orders: [facility] MWF 80 NR, 400/1.5 auto, 2K/2Ca, EDW 49kg, mircera 75 q2wks    Problem/Plan: 1. Shock: presumed septic, now off pressors, on midodrine, on broad spectrum abx cefepime and vancomycin. Suspected related to abd wound.  CCS following 2. 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 Necrotic tissue noted in wound and around ileostomy.  WC and surgery following.   3. 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  CTA completed. EEG with no seizure like activity. Per Neuro.  4. ESRD - MWF schedule dialysis on schedule, K4.1, fistulogram today with low blood flow rate yesterday 5.HTN/volume - Hypotensive, on midodrine. Volume status improved.  UF as tolerated. 6. Anemia of CKD- Hgb up to 10.6. s/p 1 unit pRBC. Continue ESA.  Labs pending today 7. Secondary hyperparathyroidism - Calcium Correct elevated  and phos in goal.  Labs pending today Senispar on hold d/t GI issues.  Not on VDRA.  No current binder 8. Nutrition - Alb 1.8   ON regular diet.  Monitor labs.   Lenny Pastel, PA-C [facility] Beeper [phone] 06/04/2023,9:41 AM  LOS: 8 days   Labs: Basic Metabolic Panel: Recent Labs  Lab 05/29/23 0733 05/30/23 0426 06/03/23 0955   NA 128* 129* 136  K 5.2* 3.9 4.1  CL 97* 93* 100  CO2 18* 22 23  GLUCOSE 130* 151* 112*  BUN 28* 14 27*  CREATININE 4.15* 2.76* 4.50*  CALCIUM 10.8* 9.9 9.5  PHOS 5.8* 3.9  --    Liver Function Tests: Recent Labs  Lab 05/29/23 0733 05/30/23 0426 06/03/23 0955  AST  --   --  14*  ALT  --   --  21  ALKPHOS  --   --  81  BILITOT  --   --  1.1  PROT  --   --  6.0*  ALBUMIN 1.9* 2.0* 1.8*   No results for input(s): "LIPASE", "AMYLASE" in the last 168 hours. No results for input(s): "AMMONIA" in the last 168 hours. CBC: Recent Labs  Lab 05/29/23 0733 05/29/23 1610 05/30/23 0426 06/03/23 0955  WBC 16.6*  --  16.1* 12.1*  HGB 6.6* 8.9* 9.1* 10.6*  HCT 21.4* 27.2* 27.0* 33.7*  MCV 86.6  --  87.9 96.0  PLT 308  --  231 210   Cardiac Enzymes: No results for input(s): "CKTOTAL", "CKMB", "CKMBINDEX", "TROPONINI" in the last 168 hours. CBG: Recent Labs  Lab 06/02/23 2137 06/03/23 0828 06/03/23 1703 06/03/23 2304 06/04/23 0830  GLUCAP 109* 120* 93 103* 138*    Studies/Results: No results found. Medications:   atorvastatin  20 mg Oral Daily   Chlorhexidine Gluconate Cloth  6 each Topical  Daily   Chlorhexidine Gluconate Cloth  6 each Topical Q0600   Chlorhexidine Gluconate Cloth  6 each Topical Q0600   darbepoetin (ARANESP) injection - DIALYSIS  60 mcg Subcutaneous Q Fri-1800   dronabinol  5 mg Oral BID AC   heparin  5,000 Units Subcutaneous Q8H   insulin aspart  0-9 Units Subcutaneous TID WC   LORazepam  1 mg Intravenous Once   midodrine  10 mg Oral TID WC   sodium hypochlorite   Irrigation BID

## 2023-06-04 NOTE — Progress Notes (Signed)
MD made aware pt has no IV access at this time. This nurse endorse to night nurse that pt needs a new IV place if possible.

## 2023-06-05 ENCOUNTER — Inpatient Hospital Stay (HOSPITAL_COMMUNITY): Payer: Medicare Other

## 2023-06-05 DIAGNOSIS — N186 End stage renal disease: Secondary | ICD-10-CM

## 2023-06-05 DIAGNOSIS — Z515 Encounter for palliative care: Secondary | ICD-10-CM | POA: Diagnosis not present

## 2023-06-05 DIAGNOSIS — A419 Sepsis, unspecified organism: Secondary | ICD-10-CM | POA: Diagnosis not present

## 2023-06-05 DIAGNOSIS — R6521 Severe sepsis with septic shock: Secondary | ICD-10-CM | POA: Diagnosis not present

## 2023-06-05 DIAGNOSIS — I5021 Acute systolic (congestive) heart failure: Secondary | ICD-10-CM | POA: Diagnosis not present

## 2023-06-05 DIAGNOSIS — I959 Hypotension, unspecified: Secondary | ICD-10-CM | POA: Diagnosis not present

## 2023-06-05 HISTORY — PX: IR US GUIDE VASC ACCESS RIGHT: IMG2390

## 2023-06-05 HISTORY — PX: IR AV DIALY SHUNT INTRO NEEDLE/INTRAC INITIAL W/PTA/STENT/IMG RT: IMG6117

## 2023-06-05 HISTORY — PX: IR AV DIALY SHUNT INTRO NEEDLE/INTRACATH INITIAL W/PTA/IMG RIGHT: IMG6116

## 2023-06-05 LAB — GLUCOSE, CAPILLARY
Glucose-Capillary: 153 mg/dL — ABNORMAL HIGH (ref 70–99)
Glucose-Capillary: 105 mg/dL — ABNORMAL HIGH (ref 70–99)
Glucose-Capillary: 120 mg/dL — ABNORMAL HIGH (ref 70–99)
Glucose-Capillary: 128 mg/dL — ABNORMAL HIGH (ref 70–99)

## 2023-06-05 MED ORDER — LIDOCAINE-EPINEPHRINE 1 %-1:100000 IJ SOLN
INTRAMUSCULAR | Status: AC
  Filled 2023-06-05: qty 1

## 2023-06-05 MED ORDER — METOPROLOL TARTRATE 5 MG/5ML IV SOLN
5.0000 mg | INTRAVENOUS | Status: DC | PRN
Start: 2023-06-05 — End: 2023-06-17

## 2023-06-05 MED ORDER — GUAIFENESIN 100 MG/5ML PO LIQD: 5.0000 mL | ORAL | Status: DC | PRN

## 2023-06-05 MED ORDER — ACETAMINOPHEN 325 MG PO TABS
650.0000 mg | ORAL_TABLET | Freq: Four times a day (QID) | ORAL | Status: DC | PRN
  Administered 2023-06-05 – 2023-06-16 (×11): 650 mg via ORAL
  Filled 2023-06-05 (×12): qty 2

## 2023-06-05 MED ORDER — DORZOLAMIDE HCL-TIMOLOL MAL 2-0.5 % OP SOLN
1.0000 [drp] | Freq: Two times a day (BID) | OPHTHALMIC | Status: DC
  Administered 2023-06-05 – 2023-06-20 (×28): 1 [drp] via OPHTHALMIC
  Filled 2023-06-05: qty 10

## 2023-06-05 MED ORDER — FENTANYL CITRATE (PF) 100 MCG/2ML IJ SOLN
INTRAMUSCULAR | Status: AC | PRN
  Administered 2023-06-05: 25 ug via INTRAVENOUS

## 2023-06-05 MED ORDER — ONDANSETRON HCL 4 MG/2ML IJ SOLN
4.0000 mg | Freq: Four times a day (QID) | INTRAMUSCULAR | Status: DC | PRN
  Administered 2023-06-15: 4 mg via INTRAVENOUS
  Filled 2023-06-05 (×2): qty 2

## 2023-06-05 MED ORDER — IPRATROPIUM-ALBUTEROL 0.5-2.5 (3) MG/3ML IN SOLN: 3.0000 mL | RESPIRATORY_TRACT | Status: DC | PRN

## 2023-06-05 MED ORDER — MIDAZOLAM HCL 2 MG/2ML IJ SOLN
INTRAMUSCULAR | Status: AC | PRN
  Administered 2023-06-05: .5 mg via INTRAVENOUS

## 2023-06-05 MED ORDER — FENTANYL CITRATE (PF) 100 MCG/2ML IJ SOLN
INTRAMUSCULAR | Status: AC
  Filled 2023-06-05: qty 2

## 2023-06-05 MED ORDER — LIDOCAINE HCL 1 % IJ SOLN
20.0000 mL | Freq: Once | INTRAMUSCULAR | Status: AC
  Administered 2023-06-05: 1 mL via INTRADERMAL

## 2023-06-05 MED ORDER — IOHEXOL 300 MG/ML  SOLN
100.0000 mL | Freq: Once | INTRAMUSCULAR | Status: AC | PRN
  Administered 2023-06-05: 100 mL

## 2023-06-05 MED ORDER — MIDAZOLAM HCL 2 MG/2ML IJ SOLN
INTRAMUSCULAR | Status: AC
  Filled 2023-06-05: qty 2

## 2023-06-05 MED ORDER — LIDOCAINE HCL 1 % IJ SOLN
INTRAMUSCULAR | Status: AC
  Filled 2023-06-05: qty 20

## 2023-06-05 MED ORDER — HYDRALAZINE HCL 20 MG/ML IJ SOLN: 10.0000 mg | INTRAMUSCULAR | Status: DC | PRN

## 2023-06-05 NOTE — Hospital Course (Addendum)
  Brief Narrative:  42 year old with history of DM2, ESRD, bilateral AKA, blindness, PAD initially presented with hypotension from dialysis.  Had a recent prolonged stay in the hospital in September 2024 for small bowel ischemia complicated by SBO and had extended ileocecectomy and discharged to SNF.  Patient has had HD catheter in the right chest wall due to difficulty accessing her AV fistula.  Eventually at SNF found to be also hypotensive during her HD therefore admitted to the ICU and PCCM was initially consulted.  Eventually she was weaned off pressors on 10/16 and [facility].  Eventually her fistula was evaluated which was functioning adequately per IR.  She tolerated multiple HD sessions without any issues.  For reduced EF cardiology saw the patient and she was deemed to be a poor candidate for any further aggressive workup and recommended outpatient follow-up.  Fistula seems to be working well, currently waiting for her to able to get dialyzed in chair. Unfortunately she has been refusing HD at times. Palliative in touch with the sister, who is considering Hospice/comfort.   Assessment & Plan:   Principal Problem:   Septic shock (HCC)   Goals of care discussion  Difficulty getting touch with patient's healthcare power of attorney, [name] sister.  Ethics was consulted. Ok to contact "next of kin" who is her husband. Palliative is having on going discussions with him.   New onset congestive heart failure with reduced EF 25% -Echocardiogram 2023 had preserved EF, echo done on 05/29/2023 showed EF of 25%.  New change, seen by cardiology but due to multiple comorbidities, continue current management.  No further intervention planned.   End-stage renal disease, likely diabetic nephropathy HD per nephrology.  AVF versus use of HD catheter per nephrology Status post IR right arm fistulogram with balloon angioplasty Needs to try hemodialysis in chair. Occasionally refusing HD.    Septic  Shock secondary to suspected wound/skin/soft tissue infection, POA Acute metabolic encephalopathy Heart stable.  Continue midodrine 10 mg orally daily.  Can slowly attempt to wean this to off.   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  LDL 19, A1c 5.7. Resume home regimen  On going GOC with Palliative   DVT prophylaxis: heparin  Code Status:   Full Code Family Communication: Nephro to make sure she is able to tolerate dialysis in chair.  Thereafter discharge   Subjective: No complaints.  Only wants pain meds, but appears comfortable.    Examination:   General exam: Chronically ill Respiratory system: Some rhonchi at the bases Cardiovascular system: S1 & S2 heard, RRR. No JVD, murmurs, rubs, gallops or clicks. No pedal edema. Gastrointestinal system: Wound dressing noted Central nervous system: Alert and oriented. No focal neurological deficits. Extremities: Bilateral AKA Skin: No rashes, lesions or ulcers Psychiatry: Judgement and insight appear poor Right upper extremity fistula

## 2023-06-05 NOTE — Progress Notes (Signed)
Pt has no PIV access. This nurse was told on 06/03/23 that IV team was unable to find any veins with the ultra sound for access. Day shift nurse passed this information on to this nurse on 06/03/23.

## 2023-06-05 NOTE — Progress Notes (Signed)
Subjective: Seen in room said tolerated dialysis yesterday ,noted AV fistula blood flow rate  low for IR fistulogram today.  Only complaint is she is hungry  Objective Vital signs in last 24 hours: Vitals:   06/05/23 1000 06/05/23 1005 06/05/23 1010 06/05/23 1015  BP: 107/71 113/69 119/68 116/69  Pulse: 76 78 77 83  Resp: 12 12 12 13   Temp:      TempSrc:      SpO2: 100% 100% 97% 97%  Weight:       Weight change: -0.6 kg   Physical Exam: General: Alert chronically ill young female NAD Heart: RRR no MRG Lungs: CTA bilaterally nonlabored breathing Abdomen: NABS nondistended soft Extremities: Bil AKA no edema Dialysis Access: RUA  AVF + bruit   Dialysis Orders: [facility] MWF 80 NR, 400/1.5 auto, 2K/2Ca, EDW 49kg, mircera 75 q2wks    Problem/Plan: 1. Shock: presumed septic, now off pressors, on midodrine. Suspected related to abd wound.  S/p abx, per primary 2. 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 Necrotic tissue noted in wound and around ileostomy.  WC and surgery following.   3. 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  CTA completed. EEG with no seizure like activity. Per Neuro.  4. ESRD - MWF schedule dialysis on schedule. S/p fgram 10/23 with IR: multiple areas of narrowing, s/p balloon angioplasty. Avoid aneurysmal areas 5.HTN/volume - Hypotensive, on midodrine. Volume status improved.  UF as tolerated. 6. Anemia of CKD- Hgb up to 10.6 most recently. s/p 1 unit pRBC. Continue ESA. 7. Secondary hyperparathyroidism - Calcium Correct elevated  and phos in goal. Senispar on hold d/t GI issues.  Not on VDRA.  No current binder. Last po4 acceptable 8. Nutrition - Alb 1.8   ON regular diet.  Monitor labs.   Anthony Sar, MD [facility]  Labs: Basic Metabolic Panel: Recent Labs  Lab 05/30/23 0426 06/03/23 0955  NA 129* 136  K 3.9 4.1  CL 93* 100  CO2 22 23  GLUCOSE 151* 112*  BUN 14 27*  CREATININE 2.76* 4.50*  CALCIUM 9.9 9.5   PHOS 3.9  --    Liver Function Tests: Recent Labs  Lab 05/30/23 0426 06/03/23 0955  AST  --  14*  ALT  --  21  ALKPHOS  --  81  BILITOT  --  1.1  PROT  --  6.0*  ALBUMIN 2.0* 1.8*   No results for input(s): "LIPASE", "AMYLASE" in the last 168 hours. No results for input(s): "AMMONIA" in the last 168 hours. CBC: Recent Labs  Lab 05/29/23 1610 05/30/23 0426 06/03/23 0955  WBC  --  16.1* 12.1*  HGB 8.9* 9.1* 10.6*  HCT 27.2* 27.0* 33.7*  MCV  --  87.9 96.0  PLT  --  231 210   Cardiac Enzymes: No results for input(s): "CKTOTAL", "CKMB", "CKMBINDEX", "TROPONINI" in the last 168 hours. CBG: Recent Labs  Lab 06/04/23 1314 06/04/23 1356 06/04/23 1824 06/05/23 0808 06/05/23 1216  GLUCAP 68* 81 143* 153* 105*    Studies/Results: No results found. Medications:   atorvastatin  20 mg Oral Daily   Chlorhexidine Gluconate Cloth  6 each Topical Daily   Chlorhexidine Gluconate Cloth  6 each Topical Q0600   Chlorhexidine Gluconate Cloth  6 each Topical Q0600   darbepoetin (ARANESP) injection - DIALYSIS  60 mcg Subcutaneous Q Fri-1800   dextrose  12.5 g Intravenous STAT   dronabinol  5 mg Oral BID AC   heparin  5,000 Units Subcutaneous  Q8H   insulin aspart  0-9 Units Subcutaneous TID WC   LORazepam  1 mg Intravenous Once   midodrine  10 mg Oral TID WC

## 2023-06-05 NOTE — Progress Notes (Signed)
   IR Note  Pt rescheduled for IR procedure this am Husband Elyse Jarvis has consented for procedure via phone yesterday with me.  Per Dr Graciela Husbands note Consent with husband is allowable and appropriate

## 2023-06-05 NOTE — Consult Note (Signed)
Cardiology Consultation   Patient ID: Siana Vikki MRN: [id number]; DOB: August 19, 1980  Admit date: 06/08/2023 Date of Consult: 06/05/2023  PCP:  [facility]   [facility] Providers Cardiologist:  None        Patient Profile:   [name] is a 42 y.o. female with a hx of insulin-dependent diabetes mellitus, end-stage renal disease (secondary to diabetes), bilateral AKA, blindness, severe peripheral arterial disease, hyperlipidemia, history of CVA, chronic anemia, mesenteric ischemia with colonic pneumatosis, two-vessel obstructive CAD who is being seen 06/05/2023 for the evaluation of new cardiomyopathy at the request of Dr. Nelson Chimes.  History of Present Illness:   [name] is a 42 year old female with multiple chronic illnesses as noted above.  She presented to the hospital on 10/15 in the setting of acute hypotension during dialysis.  Admitted for septic shock secondary to wound infection and acute encephalopathy.    She was very recently discharged (just three days prior to this admission) following a prolonged hospitalization with small bowel ischemia and resultant small bowel obstruction.  She had an extended ileal cecostomy placed.  On admission, concern that patient was septic secondary to not following instructions for dressing changes for her open abdominal wound, was not agreeable to a wound VAC.  In the emergency department, patient hypotensive and she was started on multiple vasopressors with pulmonary critical care medicine consulted for admission.  Workup in the emergency department included CT head with concern for right greater than left subacute infarct or hemorrhage.  Brain MRI with punctate acute infarct in the right frontal lobe as well as cortical hyperintense signal in right parieto-occipital region favored to represent areas of acute to subacute infarct.  CT abdomen pelvis with clusters of nodular densities entry but appearance  bilaterally consistent with infectious process.  Patient also with diffusely thickened colon, concerning for pancolitis.  She was initiated on broad-spectrum antibiotics for suspected septic shock secondary to abdominal infection.  Also started on stress dose steroids on 10/15.  Palliative care was also consulted early during her admission but had limited success in discussing goals of care with patient due to her confused status and inability to reach family members and healthcare power of attorney.  Given neurological changes, neurology also asked to see patient.  EEG with diffuse severe encephalopathy.  They ultimately signed off on patient with plans for 4-week follow-up.  On 10/16, patient weaned off of IV vasopressors, tolerating oral midodrine.  10/16 TTE with LVEF 25 to 30% with significant changes from prior study where LVEF was preserved.  Patient also noted with akinesis of apical lateral segment and mid anterolateral segment.  10/18, patient transitioned to [facility] service.   Throughout this admission there have continue to be issues with discussing goals of care.  Patient's listed power of attorney has consistently been unavailable.  Patient's husband and family are not local and unable to be present in the hospital.  Patient's living well does not specify advanced directives.  On exam today, patient with minimal contribution to HPI. She is able to answer very simple yes/no questions but mostly verbalized hunger (was NPO pre-procedure this morning). Reports chest pain and shortness of breath when asked but unable to further describe.    Past Medical History:  Diagnosis Date   Anemia    Anxiety    Blind right eye 2008   Diabetes mellitus without complication (HCC)    Embolic stroke (HCC)    ESRD (end stage renal disease) (HCC)  HD on M/W/F   ESRD on hemodialysis (HCC)    GBS (Guillain Barre syndrome) [facility])     Past Surgical History:  Procedure Laterality Date   AMPUTATION Right  08/11/2021   Procedure: TRANSMETATARSAL AMPUTATION OF RIGHT FOOT;  Surgeon: Nada Libman, MD;  Location: [facility] OR;  Service: Vascular;  Laterality: Right;   AMPUTATION Right 09/17/2021   Procedure: AMPUTATION RIGHT BELOW KNEE;  Surgeon: Nada Libman, MD;  Location: [facility] OR;  Service: Vascular;  Laterality: Right;   AMPUTATION Right 10/11/2021   Procedure: RIGHT ABOVE KNEE AMPUTATION;  Surgeon: Nada Libman, MD;  Location: [facility] OR;  Service: Vascular;  Laterality: Right;   AMPUTATION Left 07/02/2022   Procedure: LEFT ABOVE KNEE AMPUTATION;  Surgeon: Chuck Hint, MD;  Location: [facility] OR;  Service: Vascular;  Laterality: Left;   AMPUTATION TOE Left    APPLICATION OF WOUND VAC  08/16/2021   Procedure: APPLICATION OF WOUND VAC;  Surgeon: Nada Libman, MD;  Location: [facility] OR;  Service: Vascular;;   AV FISTULA PLACEMENT     BUBBLE STUDY  10/02/2021   Procedure: BUBBLE STUDY;  Surgeon: Pricilla Riffle, MD;  Location: [facility] ENDOSCOPY;  Service: Cardiovascular;;   CESAREAN SECTION  2011   COLOSTOMY Right 05/04/2023   Procedure: COLOSTOMY;  Surgeon: Moise Boring, MD;  Location: [facility] OR;  Service: General;  Laterality: Right;   FISTULA SUPERFICIALIZATION Right 07/27/2019   Procedure: 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;  Surgeon: Chuck Hint, MD;  Location: [facility] OR;  Service: Vascular;  Laterality: Right;   INSERTION OF DIALYSIS CATHETER Left 07/27/2019   Procedure: INSERTION OF TUNNELED  DIALYSIS CATHETER;  Surgeon: Chuck Hint, MD;  Location: [facility] OR;  Service: Vascular;  Laterality: Left;   INSERTION OF DIALYSIS CATHETER Left 10/23/2021   Procedure: INSERTION OF TUNNELED PALINDROME PRECISION DIALYSIS CATHETER (23cm);  Surgeon: Cephus Shelling, MD;  Location[facility] OR;  Service: Vascular;  Laterality: Left;   IR AV DIALY SHUNT INTRO NEEDLE/INTRACATH INITIAL W/PTA/IMG RIGHT Right 05/14/2023   IR FLUORO GUIDE CV LINE LEFT  10/16/2021   IR FLUORO GUIDE CV LINE  LEFT  05/10/2023   IR FLUORO GUIDE CV LINE RIGHT  10/11/2021   IR GASTROSTOMY TUBE MOD SED  10/30/2021   IR GASTROSTOMY TUBE REMOVAL  05/08/2022   IR US GUIDE VASC ACCESS LEFT  10/16/2021   IR US GUIDE VASC ACCESS LEFT  05/10/2023   IR US GUIDE VASC ACCESS RIGHT  10/11/2021   IR US GUIDE VASC ACCESS RIGHT  05/14/2023   LAPAROSCOPY N/A 05/04/2023   Procedure: LAPAROSCOPY DIAGNOSTIC;  Surgeon: Moise Boring, MD;  Location: [facility] OR;  Service: General;  Laterality: N/A;   LAPAROTOMY N/A 05/04/2023   Procedure: EXPLORATION LAPAROTOMY WITH ILEO-CAECAL RESECTION;  Surgeon: Moise Boring, MD;  Location: [facility] OR;  Service: General;  Laterality: N/A;   LEFT HEART CATH AND CORONARY ANGIOGRAPHY N/A 07/26/2022   Procedure: LEFT HEART CATH AND CORONARY ANGIOGRAPHY;  Surgeon: Swaziland, Peter M, MD;  Location: [facility] INVASIVE CV LAB;  Service: Cardiovascular;  Laterality: N/A;   LOWER EXTREMITY ANGIOGRAPHY N/A 08/11/2021   Procedure: LOWER EXTREMITY ANGIOGRAPHY;  Surgeon: Nada Libman, MD;  Location: [facility] INVASIVE CV LAB;  Service: Cardiovascular;  Laterality: N/A;   REVISON OF ARTERIOVENOUS FISTULA Right 10/23/2021   Procedure: REVISON OF ARTERIOVENOUS FISTULA  ARM AND PLICATION;  Surgeon: Cephus Shelling, MD;  Location: [facility] OR;  Service: Vascular;  Laterality: Right;  TEE WITHOUT CARDIOVERSION N/A 10/02/2021   Procedure: TRANSESOPHAGEAL ECHOCARDIOGRAM (TEE);  Surgeon: Pricilla Riffle, MD;  Location: [facility] ENDOSCOPY;  Service: Cardiovascular;  Laterality: N/A;   TRANSMETATARSAL AMPUTATION Right 08/16/2021   Procedure: CLOSURE OF RIGHT TRANSMETATARSAL AMPUTATION;  Surgeon: Nada Libman, MD;  Location: [facility] OR;  Service: Vascular;  Laterality: Right;     Home Medications:  Prior to Admission medications   Medication Sig Start Date End Date Taking? Authorizing Provider  acetaminophen (TYLENOL) 325 MG tablet Take 650 mg by mouth every 6 (six) hours as needed for fever or mild pain.   Yes [provider]   atorvastatin (LIPITOR) 40 MG tablet Place 1 tablet (40 mg total) into feeding tube daily. Patient taking differently: Take 40 mg by mouth daily. 10/26/21  Yes Burnadette Pop, MD  azelastine (OPTIVAR) 0.05 % ophthalmic solution Place 1 drop into both eyes 2 (two) times daily as needed (allergies). 11/13/22  Yes [provider]  cetirizine (ZYRTEC) 10 MG tablet Take 10 mg by mouth daily.   Yes [provider]  Dorzolamide HCl-Timolol Mal PF 2-0.5 % SOLN Place 1 drop into the left eye in the morning and at bedtime.   Yes [provider]  dorzolamide-timolol (COSOPT) 22.3-6.8 MG/ML ophthalmic solution Place 1 drop into the left eye 2 (two) times daily. 03/30/21  Yes [provider]  dronabinol (MARINOL) 5 MG capsule Take 1 capsule (5 mg total) by mouth 2 (two) times daily before lunch and supper. 05/24/23  Yes Rhetta Mura, MD  glucagon, human recombinant, (GLUCAGEN DIAGNOSTIC) 1 MG injection Inject 1 mg into the vein once as needed for low blood sugar.   Yes [provider]  HYDROcodone-acetaminophen (NORCO/VICODIN) 5-325 MG tablet Take 1 tablet by mouth every 6 (six) hours as needed for moderate pain. 05/24/23  Yes Rhetta Mura, MD  insulin aspart (FIASP FLEXTOUCH) 100 UNIT/ML FlexTouch Pen Inject 1-10 Units into the skin 4 (four) times daily -  before meals and at bedtime. Inject as per sliding scale: If 121-150= 1 units call MD if blood sugar <70 or 400>; 151-200= 2 units 201-250= 3 units 251-300= 5 units 301-350= 7 units 351-400= 10 units Subcutaneously before meals and at bedtime.   Yes [provider]  Insulin Glargine (BASAGLAR KWIKPEN) 100 UNIT/ML Inject 5 Units into the skin daily. 05/21/2023  Yes [provider]  loperamide (IMODIUM) 2 MG capsule Take 1 capsule (2 mg total) by mouth every 6 (six) hours as needed for diarrhea or loose stools. 05/24/23  Yes Rhetta Mura, MD  midodrine (PROAMATINE) 10 MG tablet  Take 1 tablet (10 mg total) by mouth 3 (three) times daily with meals. 05/24/23  Yes Rhetta Mura, MD  Multiple Vitamin (MULTIVITAMIN WITH MINERALS) TABS tablet Take 1 tablet by mouth daily.   Yes [provider]  ondansetron (ZOFRAN) 4 MG tablet Take 4 mg by mouth every 4 (four) hours as needed for nausea/vomiting. 03/02/22  Yes [provider]  polycarbophil (FIBERCON) 625 MG tablet Take 2 tablets (1,250 mg total) by mouth 2 (two) times daily. 05/24/23  Yes Rhetta Mura, MD  sevelamer carbonate (RENVELA) 800 MG tablet Take 1,600 mg by mouth 3 (three) times daily. 06/12/2023  Yes [provider]  thiamine 100 MG tablet Place 1 tablet (100 mg total) into feeding tube daily. Patient taking differently: Take 100 mg by mouth daily. 10/26/21  Yes Burnadette Pop, MD  Accu-Chek FastClix Lancets MISC Use as instructed to check blood sugar up to TID.  E11.22 Patient taking differently: 1 each by Other route See admin instructions. Use as instructed to check blood sugar up to TID. E11.22 04/15/19   Hoy Register, MD  Continuous Blood Gluc Transmit (DEXCOM G6 TRANSMITTER) MISC 1 Device by Does not apply route as directed. 09/12/20   Shamleffer, Konrad Dolores, MD  glucose blood (ACCU-CHEK GUIDE) test strip Use as instructed to check blood sugar up to TID. E11.22 Patient taking differently: 1 each by Other route See admin instructions. Use as instructed to check blood sugar up to TID. E11.22 07/17/19   Fulp, Cammie, MD  lanthanum (FOSRENOL) 1000 MG chewable tablet Chew 1 tablet (1,000 mg total) by mouth 3 (three) times daily with meals. Patient not taking: Reported on 05/28/2023 05/24/23   Rhetta Mura, MD  Nutritional Supplements (NUTRITIONAL DRINK) LIQD Take 120 mLs by mouth daily.    [provider]    Inpatient Medications: Scheduled Meds:  atorvastatin  20 mg Oral Daily   Chlorhexidine Gluconate Cloth  6 each Topical Daily   Chlorhexidine  Gluconate Cloth  6 each Topical Q0600   Chlorhexidine Gluconate Cloth  6 each Topical Q0600   darbepoetin (ARANESP) injection - DIALYSIS  60 mcg Subcutaneous Q Fri-1800   dextrose  12.5 g Intravenous STAT   dronabinol  5 mg Oral BID AC   heparin  5,000 Units Subcutaneous Q8H   insulin aspart  0-9 Units Subcutaneous TID WC   LORazepam  1 mg Intravenous Once   midodrine  10 mg Oral TID WC   Continuous Infusions:  PRN Meds: acetaminophen, docusate sodium, guaiFENesin, hydrALAZINE, ipratropium-albuterol, lip balm, metoprolol tartrate, ondansetron (ZOFRAN) IV, mouth rinse, oxyCODONE, polyethylene glycol  Allergies:    Allergies  Allergen Reactions   Morphine Rash and Other (See Comments)    Per patient MAR.   Peanut-Containing Drug Products Anaphylaxis and Hives   Penicillins Rash and Other (See Comments)    Rash in 2008.  Tolerated cefazolin in 2020. No reaction listed on MAR.   Shellfish Allergy Swelling   Chocolate Hives    Social History:   Social History   Socioeconomic History   Marital status: Single    Spouse name: Not on file   Number of children: Not on file   Years of education: Not on file   Highest education level: Not on file  Occupational History   Not on file  Tobacco Use   Smoking status: Never   Smokeless tobacco: Never  Vaping Use   Vaping status: Never Used  Substance and Sexual Activity   Alcohol use: No   Drug use: No   Sexual activity: Not on file  Other Topics Concern   Not on file  Social History Narrative   Not on file   Social Determinants of Health   Financial Resource Strain: Not on file  Food Insecurity: No Food Insecurity (04/30/2023)   Hunger Vital Sign    Worried About Running Out of Food in the Last Year: Never true    Ran Out of Food in the Last Year: Never true  Transportation Needs: No Transportation Needs (04/30/2023)   PRAPARE - Administrator, Civil Service (Medical): No    Lack of Transportation (Non-Medical):  No  Physical Activity: Not on file  Stress: Not on file  Social Connections: Not on file  Intimate Partner Violence: Not At Risk (04/30/2023)   Humiliation, Afraid, Rape, and Kick questionnaire    Fear of Current or Ex-Partner: No    Emotionally Abused:  No    Physically Abused: No    Sexually Abused: No    Family History:    Family History  Problem Relation Age of Onset   Diabetes Mother    Diabetes Father      ROS:  Please see the history of present illness.   All other ROS reviewed and negative.     Physical Exam/Data:   Vitals:   06/05/23 1000 06/05/23 1005 06/05/23 1010 06/05/23 1015  BP: 107/71 113/69 119/68 116/69  Pulse: 76 78 77 83  Resp: 12 12 12 13   Temp:      TempSrc:      SpO2: 100% 100% 97% 97%  Weight:        Intake/Output Summary (Last 24 hours) at 06/05/2023 1112 Last data filed at 06/05/2023 0600 Gross per 24 hour  Intake 60 ml  Output 300 ml  Net -240 ml      06/05/2023    5:00 AM 06/04/2023    7:00 AM 06/03/2023    5:00 AM  Last 3 Weights  Weight (lbs) 97 lb 14.2 oz 99 lb 3.3 oz 100 lb 5 oz  Weight (kg) 44.4 kg 45 kg 45.5 kg     Body mass index is 18.5 kg/m.  General:  Frail and ill appearing. No acute distress HEENT: normal Neck: no JVD Vascular: No carotid bruits; Distal pulses 2+ bilaterally Cardiac:  normal S1, S2; RRR; no murmur Lungs:  clear to auscultation bilaterally, no wheezing, rhonchi or rales. Poor inspiratory effort Abd: ileostomy with semi solid stool. Necrotic appearing tissue noted.  Ext: no edema Musculoskeletal:  No deformities, BUE and BLE strength normal and equal Skin: warm and dry  Neuro:  CNs 2-12 intact, no focal abnormalities noted Psych: Limited verbal interaction on exam  EKG:  10/14 ECG showed diffuse ST segment depression, not previously noted. Repeat study today essentially unchanged with global ST depression/TWI Telemetry:  Telemetry was personally reviewed and demonstrates:  sinus  rhythm  Relevant CV Studies:  05/29/23 TTE  IMPRESSIONS     1. Left ventricular ejection fraction, by estimation, is 25 to 30%. The  left ventricle has severely decreased function. The left ventricle  demonstrates regional wall motion abnormalities (see scoring  diagram/findings for description). There is moderate  asymmetric left ventricular hypertrophy of the septal segment.  Indeterminate diastolic filling due to E-A fusion.   2. Right ventricular systolic function is moderately reduced. The right  ventricular size is mildly enlarged.   3. Catheter tip noted in the RA.   4. The mitral valve is degenerative. Mild to moderate mitral valve  regurgitation. No evidence of mitral stenosis.   5. Tricuspid valve regurgitation is moderate to severe.   6. Focal calcification of the NCC noted. The aortic valve is tricuspid.  Aortic valve regurgitation is not visualized. Aortic valve sclerosis is  present, with no evidence of aortic valve stenosis.   Comparison(s): Changes from prior study are noted. The left ventricular  function is significantly worse. LVEF now severely reduced with WMA. RV  dysfunction now present.   FINDINGS   Left Ventricle: Left ventricular ejection fraction, by estimation, is 25  to 30%. The left ventricle has severely decreased function. The left  ventricle demonstrates regional wall motion abnormalities. The left  ventricular internal cavity size was normal   in size. There is moderate asymmetric left ventricular hypertrophy of the  septal segment. Indeterminate diastolic filling due to E-A fusion.     LV Wall Scoring:  The  apical lateral segment and mid anterolateral segment are akinetic.   Right Ventricle: The right ventricular size is mildly enlarged. No  increase in right ventricular wall thickness. Right ventricular systolic  function is moderately reduced.   Left Atrium: Left atrial size was normal in size.   Right Atrium: Catheter tip noted in  the RA. Right atrial size was normal  in size.   Pericardium: Trivial pericardial effusion is present.   Mitral Valve: The mitral valve is degenerative in appearance. Mild to  moderate mitral annular calcification. Mild to moderate mitral valve  regurgitation. No evidence of mitral valve stenosis.   Tricuspid Valve: The tricuspid valve is grossly normal. Tricuspid valve  regurgitation is moderate to severe. No evidence of tricuspid stenosis.   Aortic Valve: Focal calcification of the NCC noted. The aortic valve is  tricuspid. Aortic valve regurgitation is not visualized. Aortic valve  sclerosis is present, with no evidence of aortic valve stenosis. Aortic  valve mean gradient measures 2.0 mmHg.  Aortic valve peak gradient measures 3.6 mmHg. Aortic valve area, by VTI  measures 1.87 cm.   Pulmonic Valve: The pulmonic valve was grossly normal. Pulmonic valve  regurgitation is mild. No evidence of pulmonic stenosis.   Aorta: The aortic root and ascending aorta are structurally normal, with  no evidence of dilitation.   Venous: The inferior vena cava was not well visualized.   IAS/Shunts: The atrial septum is grossly normal.   Laboratory Data:  High Sensitivity Troponin:   Recent Labs  Lab 05/30/2023 1510  TROPONINIHS 22*     Chemistry Recent Labs  Lab 05/30/23 0426 06/03/23 0955  NA 129* 136  K 3.9 4.1  CL 93* 100  CO2 22 23  GLUCOSE 151* 112*  BUN 14 27*  CREATININE 2.76* 4.50*  CALCIUM 9.9 9.5  GFRNONAA 21* 12*  ANIONGAP 14 13    Recent Labs  Lab 05/30/23 0426 06/03/23 0955  PROT  --  6.0*  ALBUMIN 2.0* 1.8*  AST  --  14*  ALT  --  21  ALKPHOS  --  81  BILITOT  --  1.1   Lipids No results for input(s): "CHOL", "TRIG", "HDL", "LABVLDL", "LDLCALC", "CHOLHDL" in the last 168 hours.  Hematology Recent Labs  Lab 05/29/23 1610 05/30/23 0426 06/03/23 0955  WBC  --  16.1* 12.1*  RBC  --  3.07* 3.51*  HGB 8.9* 9.1* 10.6*  HCT 27.2* 27.0* 33.7*  MCV  --   87.9 96.0  MCH  --  29.6 30.2  MCHC  --  33.7 31.5  RDW  --  20.2* 23.4*  PLT  --  231 210   Thyroid No results for input(s): "TSH", "FREET4" in the last 168 hours.  BNPNo results for input(s): "BNP", "PROBNP" in the last 168 hours.  DDimer No results for input(s): "DDIMER" in the last 168 hours.   Radiology/Studies:  No results found.   Assessment and Plan:   Acute HFrEF with RWMA Two-vessel obstructive CAD  Patient with 07/24/2022 TTE showing LVEF of 60 to 65% with no regional wall motion abnormalities.  Grade 2 diastolic dysfunction noted.  Left heart catheterization on 07/26/2022 showed two-vessel obstructive CAD with first diagonal 80% stenosed and lateral first obtuse marginal branch 99% stenosed.  Management was pursued given that OM branch was too small for PCI and had diffuse thrombosis.  Echocardiogram this current admission now shows severe LV dysfunction with LVEF estimated at 25 to 30% and akinesis of apical lateral and mid  anterolateral segments.  Patient with multiple risk factors for progressive coronary artery disease that might explain her cardiomyopathy with regional wall motion abnormalities. RWMA on echo are consistent with territories supplied by first diag and first marginal. However, with acute illness/sepsis, diffuse ST segment depression/TWI on ECG could also indicate stress cardiomyopathy. Troponin of 22 in ESRD patient on admission is reassuring.  This is a very difficult situation given altered mental status and recent noncompliance with recommended therapies.  Given this along with high risk for decompensation and chronic organ failure, do not see a role for inpatient ischemic evaluation with the risk of complications being greater than potential benefits. Patient not a candidate for typical heart failure GDMT secondary to ESRD and hypotension.  Given hypotension this admission necessitating vasopressors and now midodrine, do not believe she would tolerate a  beta-blocker currently. If BP continues to improve, this would be first recommended addition to her medication regimen.   Hyperlipidemia  Continue Atorvastatin 40mg .   Per primary team Septic shock Acute metabolic encephalopathy Punctate acute infarct right frontal lobe End-stage renal disease Type 2 diabetes Small bowel ischemia   Risk Assessment/Risk Scores:        New York Heart Association (NYHA) Functional Class Unreliable HPI due to acute confusion         For questions or updates, please contact [facility] Please consult [email or web address] for contact info under    Signed, Perlie Gold, PA-C  06/05/2023 11:12 AM

## 2023-06-05 NOTE — Procedures (Signed)
Interventional Radiology Procedure:   Indications: Poor flow in right arm AV fistula  Procedure: Right arm fistulogram with balloon angioplasty of right cephalic vein and right central veins  Findings: Multiple areas of narrowing involving right cephalic arch and right central veins.  Right cephalic vein treated with 7 mm balloon and right central veins treated with 10 mm and 12 mm balloons. Minimal improvement after balloon dilatation.  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    Complications: None     EBL: Minimal  Plan: AV fistula is ready to use but recommend NOT sticking the aneurysmal area because I suspect this is contributing to the poor flows.  Consider surgical evaluation of this area.  Sinda Leedom R. Lowella Dandy, MD  Pager: [phone]

## 2023-06-05 NOTE — Plan of Care (Signed)
  Problem: Education: Goal: Knowledge of the prescribed therapeutic regimen will improve Outcome: Progressing Goal: Ability to verbalize activity precautions or restrictions will improve Outcome: Progressing Goal: Understanding of discharge needs will improve Outcome: Progressing   Problem: Clinical Measurements: Goal: Postoperative complications will be avoided or minimized Outcome: Progressing   Problem: Self-Care: Goal: Ability to meet self-care needs will improve Outcome: Progressing   Problem: Self-Concept: Goal: Ability to maintain and perform role responsibilities to the fullest extent possible will improve Outcome: Progressing   Problem: Pain Management: Goal: Pain level will decrease with appropriate interventions Outcome: Progressing   Problem: Skin Integrity: Goal: Demonstration of wound healing without infection will improve Outcome: Progressing   Problem: Activity: Goal: Risk for activity intolerance will decrease Outcome: Progressing   Problem: Nutrition: Goal: Adequate nutrition will be maintained Outcome: Progressing   Problem: Coping: Goal: Level of anxiety will decrease Outcome: Progressing   Problem: Elimination: Goal: Will not experience complications related to bowel motility Outcome: Progressing Goal: Will not experience complications related to urinary retention Outcome: Progressing   Problem: Pain Managment: Goal: General experience of comfort will improve Outcome: Progressing   Problem: Safety: Goal: Ability to remain free from injury will improve Outcome: Progressing   Problem: Skin Integrity: Goal: Risk for impaired skin integrity will decrease Outcome: Progressing   Problem: Education: Goal: Ability to describe self-care measures that may prevent or decrease complications (Diabetes Survival Skills Education) will improve Outcome: Progressing Goal: Individualized Educational Video(s) Outcome: Progressing   Problem:  Coping: Goal: Ability to adjust to condition or change in health will improve Outcome: Progressing   Problem: Fluid Volume: Goal: Ability to maintain a balanced intake and output will improve Outcome: Progressing   Problem: Health Behavior/Discharge Planning: Goal: Ability to identify and utilize available resources and services will improve Outcome: Progressing Goal: Ability to manage health-related needs will improve Outcome: Progressing   Problem: Metabolic: Goal: Ability to maintain appropriate glucose levels will improve Outcome: Progressing   Problem: Nutritional: Goal: Maintenance of adequate nutrition will improve Outcome: Progressing Goal: Progress toward achieving an optimal weight will improve Outcome: Progressing   Problem: Skin Integrity: Goal: Risk for impaired skin integrity will decrease Outcome: Progressing   Problem: Tissue Perfusion: Goal: Adequacy of tissue perfusion will improve Outcome: Progressing   Problem: Education: Goal: Knowledge of disease or condition will improve Outcome: Progressing Goal: Knowledge of secondary prevention will improve (MUST DOCUMENT ALL) Outcome: Progressing Goal: Knowledge of patient specific risk factors will improve [name] N/A or DELETE if not current risk factor) Outcome: Progressing   Problem: Ischemic Stroke/TIA Tissue Perfusion: Goal: Complications of ischemic stroke/TIA will be minimized Outcome: Progressing   Problem: Coping: Goal: Will verbalize positive feelings about self Outcome: Progressing Goal: Will identify appropriate support needs Outcome: Progressing   Problem: Health Behavior/Discharge Planning: Goal: Ability to manage health-related needs will improve Outcome: Progressing Goal: Goals will be collaboratively established with patient/family Outcome: Progressing   Problem: Self-Care: Goal: Ability to participate in self-care as condition permits will improve Outcome: Progressing Goal:  Verbalization of feelings and concerns over difficulty with self-care will improve Outcome: Progressing Goal: Ability to communicate needs accurately will improve Outcome: Progressing   Problem: Nutrition: Goal: Risk of aspiration will decrease Outcome: Progressing Goal: Dietary intake will improve Outcome: Progressing

## 2023-06-05 NOTE — Progress Notes (Signed)
PROGRESS NOTE    [name]  ZOX:[id number] DOB: 1981/03/26 DOA: 06/05/2023 PCP: [facility]     Brief Narrative:  42 year old with history of DM2, ESRD, bilateral AKA, blindness, PAD initially presented with hypotension from dialysis.  Had a recent prolonged stay in the hospital in September 2024 for small bowel ischemia complicated by SBO and had extended ileocecectomy and discharged to SNF.  Patient has had HD catheter in the right chest wall due to difficulty accessing her AV fistula.  Eventually at SNF found to be also hypotensive during her HD therefore admitted to the ICU and PCCM was initially consulted.  Eventually she was weaned off pressors on 10/16 and transferred to [facility].  During this time it has been very difficult to get in touch with patient's [facility] POA, Sister [name] to get any formal consents.  Eventually ethics was consulted and stated next point of contact should be her husband.  Assessment & Plan:   Principal Problem:   Septic shock (HCC)   Goals of care discussion  Difficulty getting touch with patient's healthcare power of attorney, [name] sister.  Ethics consulted, okay to contact patient's husband as patient is still unable to make decision and he would be next of kin  New onset congestive heart failure with reduced EF 25% -Echocardiogram 2023 had preserved EF, echo done on 05/29/2023 showed EF of 25%.  This is a significant new change.  I will consult cardiology for their input   End-stage renal disease, likely diabetic nephropathy Temporary HD catheter removed, nephrology following.  IR to evaluate fistula     Septic Shock secondary to suspected wound/skin/soft tissue infection, POA Acute metabolic encephalopathy Patient has completed all antibiotic course at this point.  Currently on midodrine 10 mg 3 times daily.   Type 2 diabetes Peripheral vascular disease Recent small bowel ischemia Bilateral AKA Sliding scale and  Accu-Chek   Punctate acute infarct R frontal love, acute to subacute infarct right parieto occipital infarct: EEG negative, seen by neurology.  LDL 19, A1c 5.7.   DVT prophylaxis: heparin injection 5,000 Units Start: 05/31/2023 2200 Code Status:   Code Status: Full Code Family Communication: None present Ongoing IR evaluation for fistula graft.  Continue hospital stay.         Subjective: Seen at bedside, no complaints.    Examination:  General exam: Appears calm and comfortable  Respiratory system: Clear to auscultation. Respiratory effort normal. Cardiovascular system: S1 & S2 heard, RRR. No JVD, murmurs, rubs, gallops or clicks. No pedal edema. Gastrointestinal system: Abdomen is nondistended, soft and nontender. No organomegaly or masses felt. Normal bowel sounds heard. Central nervous system: Alert and oriented to name only.  Extremities: Symmetric 4 x 5 power. Skin: No rashes, lesions or ulcers Psychiatry: Judgement and insight appear poor   Pressure Injury 05/20/2023 Buttocks Left Stage 2 -  Partial thickness loss of dermis presenting as a shallow open injury with a red, pink wound bed without slough. (Active)  05/14/2023 2128  Location: Buttocks  Location Orientation: Left  Staging: Stage 2 -  Partial thickness loss of dermis presenting as a shallow open injury with a red, pink wound bed without slough.  Wound Description (Comments):   Present on Admission: Yes     Pressure Injury 05/25/2023 Sacrum Stage 1 -  Intact skin with non-blanchable redness of a localized area usually over a bony prominence. Old, healed pressure injury (Active)  06/04/2023 2128  Location: Sacrum  Location Orientation:   Staging:  Stage 1 -  Intact skin with non-blanchable redness of a localized area usually over a bony prominence.  Wound Description (Comments): Old, healed pressure injury  Present on Admission: Yes     Diet Orders (From admission, onward)     Start     Ordered   06/05/23 1144   Diet regular Room service appropriate? Yes; Fluid consistency: Thin  Diet effective now       Question Answer Comment  Room service appropriate? Yes   Fluid consistency: Thin      06/05/23 1143            Objective: Vitals:   06/05/23 1000 06/05/23 1005 06/05/23 1010 06/05/23 1015  BP: 107/71 113/69 119/68 116/69  Pulse: 76 78 77 83  Resp: 12 12 12 13   Temp:      TempSrc:      SpO2: 100% 100% 97% 97%  Weight:        Intake/Output Summary (Last 24 hours) at 06/05/2023 1226 Last data filed at 06/05/2023 0600 Gross per 24 hour  Intake 60 ml  Output 300 ml  Net -240 ml   Filed Weights   06/03/23 0500 06/04/23 0700 06/05/23 0500  Weight: 45.5 kg 45 kg 44.4 kg    Scheduled Meds:  atorvastatin  20 mg Oral Daily   Chlorhexidine Gluconate Cloth  6 each Topical Daily   Chlorhexidine Gluconate Cloth  6 each Topical Q0600   Chlorhexidine Gluconate Cloth  6 each Topical Q0600   darbepoetin (ARANESP) injection - DIALYSIS  60 mcg Subcutaneous Q Fri-1800   dextrose  12.5 g Intravenous STAT   dronabinol  5 mg Oral BID AC   heparin  5,000 Units Subcutaneous Q8H   insulin aspart  0-9 Units Subcutaneous TID WC   LORazepam  1 mg Intravenous Once   midodrine  10 mg Oral TID WC   Continuous Infusions:  Nutritional status     Body mass index is 18.5 kg/m.  Data Reviewed:   CBC: Recent Labs  Lab 05/29/23 1610 05/30/23 0426 06/03/23 0955  WBC  --  16.1* 12.1*  HGB 8.9* 9.1* 10.6*  HCT 27.2* 27.0* 33.7*  MCV  --  87.9 96.0  PLT  --  231 210   Basic Metabolic Panel: Recent Labs  Lab 05/30/23 0426 06/03/23 0955  NA 129* 136  K 3.9 4.1  CL 93* 100  CO2 22 23  GLUCOSE 151* 112*  BUN 14 27*  CREATININE 2.76* 4.50*  CALCIUM 9.9 9.5  PHOS 3.9  --    GFR: Estimated Creatinine Clearance: 11.5 mL/min (A) (by C-G formula based on SCr of 4.5 mg/dL (H)). Liver Function Tests: Recent Labs  Lab 05/30/23 0426 06/03/23 0955  AST  --  14*  ALT  --  21  ALKPHOS  --   81  BILITOT  --  1.1  PROT  --  6.0*  ALBUMIN 2.0* 1.8*   No results for input(s): "LIPASE", "AMYLASE" in the last 168 hours. No results for input(s): "AMMONIA" in the last 168 hours. Coagulation Profile: No results for input(s): "INR", "PROTIME" in the last 168 hours. Cardiac Enzymes: No results for input(s): "CKTOTAL", "CKMB", "CKMBINDEX", "TROPONINI" in the last 168 hours. BNP (last 3 results) No results for input(s): "PROBNP" in the last 8760 hours. HbA1C: No results for input(s): "HGBA1C" in the last 72 hours. CBG: Recent Labs  Lab 06/04/23 1314 06/04/23 1356 06/04/23 1824 06/05/23 0808 06/05/23 1216  GLUCAP 68* 81 143* 153* 105*  Lipid Profile: No results for input(s): "CHOL", "HDL", "LDLCALC", "TRIG", "CHOLHDL", "LDLDIRECT" in the last 72 hours. Thyroid Function Tests: No results for input(s): "TSH", "T4TOTAL", "FREET4", "T3FREE", "THYROIDAB" in the last 72 hours. Anemia Panel: No results for input(s): "VITAMINB12", "FOLATE", "FERRITIN", "TIBC", "IRON", "RETICCTPCT" in the last 72 hours. Sepsis Labs: No results for input(s): "PROCALCITON", "LATICACIDVEN" in the last 168 hours.  Recent Results (from the past 240 hour(s))  Blood culture (routine x 2)     Status: None   Collection Time: 06/13/2023  3:10 PM   Specimen: BLOOD LEFT ARM  Result Value Ref Range Status   Specimen Description BLOOD LEFT ARM  Final   Special Requests   Final    BOTTLES DRAWN AEROBIC AND ANAEROBIC Blood Culture adequate volume   Culture   Final    NO GROWTH 5 DAYS Performed at [facility], [address] [place], [place] [address]    Report Status 06/01/2023 FINAL  Final  Blood culture (routine x 2)     Status: None   Collection Time: 06/12/2023  3:27 PM   Specimen: BLOOD LEFT ARM  Result Value Ref Range Status   Specimen Description BLOOD LEFT ARM  Final   Special Requests   Final    BOTTLES DRAWN AEROBIC AND ANAEROBIC Blood Culture adequate volume   Culture   Final    NO  GROWTH 5 DAYS Performed at [facility], [address] [place], [place] [address]    Report Status 06/01/2023 FINAL  Final  MRSA Next Gen by PCR, Nasal     Status: None   Collection Time: 05/22/2023  9:21 PM   Specimen: Nasal Mucosa; Nasal Swab  Result Value Ref Range Status   MRSA by PCR Next Gen NOT DETECTED NOT DETECTED Final    Comment: (NOTE) The GeneXpert MRSA Assay (FDA approved for NASAL specimens only), is one component of a comprehensive MRSA colonization surveillance program. It is not intended to diagnose MRSA infection nor to guide or monitor treatment for MRSA infections. Test performance is not FDA approved in patients less than 77 years old. Performed at [facility], [address] [place], [place] [address]          Radiology Studies: No results found.         LOS: 9 days   Time spent= 35 mins    Miguel Rota, MD [facility]  If 7PM-7AM, please contact night-coverage  06/05/2023, 12:26 PM

## 2023-06-05 NOTE — Care Management Important Message (Signed)
Important Message  Patient Details  Name: [name] MRN: [id number] Date of Birth: [birth year]   Important Message Given:  Yes - Medicare IM     Sherilyn Banker 06/05/2023, 2:54 PM

## 2023-06-05 NOTE — Sedation Documentation (Addendum)
RN attempted to get a 24g IV to the left hand. RN was able to puncture the vein, but pt was yelling and RN unable to advance. Catheter and IV removed. Catheter intact and bleeding controlled. Pt states pain when moving right arm. Dr. Lowella Dandy saw pt in nursing bay. Dr. Lowella Dandy aware that we do not have IV access.

## 2023-06-05 NOTE — Progress Notes (Signed)
Daily Progress Note   Patient Name: [name]       Date: 06/05/2023 DOB: [birth year]  Age: 42 y.o. MRN#: [id number] Attending Physician: Miguel Rota, MD Primary Care Physician: [facility] Admit Date: 06/06/2023  Reason for Consultation/Follow-up: Establishing goals of care  Subjective: Medical records reviewed including progress notes, labs, and imaging. Patient assessed at the bedside. She denies pain or distress.   Patient previously indicated she would like to her husband to assist with medical decision-making, although she is confused and currently unable to demonstrate her full understanding of this. Per ethics note 10/22, husband is able to make decisions as next of kin given that HCPOA is unavailable.   Called patient's husband Alecia Lemming and introduced Palliative Medicine as specialized medical care for people living with serious illness. It focuses on providing relief from the symptoms and stress of a serious illness. The goal is to improve quality of life for both the patient and the family.   I attempted to elicit values and goals of care important to the patient.  Alecia Lemming shares that they have not had many conversations about her care preferences or goals in the past.  They have been married for 7 years and he would cook her vegan food and try to help keep her calm.  He states that he is the only one that cares for her now that her sister is unable to be reached.  He confirms that she is spiritual and would attend church frequently in the past.  I explored what patient would feel about aggressive invention such as CPR, ventilator support.  He does not feel prepared to be able to answer this.  Discussed the importance of ongoing goals of care discussions,  consideration of best case and worst-case scenarios, ensuring decisions are within the context of patient's values and goals.  I reviewed concerns regarding patient's prognosis and severity of her illness, though he quickly states "I hope the medications work."  He states that he can be reached at any time of day and he would be agreeable to speaking with me in the presence of patient to see if she may participate.  Questions and concerns addressed. PMT will continue to support holistically.   Length of Stay: 9   Physical Exam Vitals and nursing note reviewed.  Constitutional:      General: She is not in acute distress.    Appearance: She is ill-appearing.  Cardiovascular:     Rate and Rhythm: Normal rate.  Pulmonary:     Effort: Pulmonary effort is normal.  Neurological:     Mental Status: She is easily aroused. She is disoriented and confused.  Psychiatric:        Mood and Affect: Affect is flat.        Cognition and Memory: Cognition is impaired.            Vital Signs: BP (!) 100/56 (BP Location: Left Arm)   Pulse 80   Temp 98.3 F (36.8 C) (Oral)   Resp 17   Wt 44.4 kg   SpO2 100%   BMI 18.50 kg/m  SpO2: SpO2: 100 % O2 Device: O2 Device: Room Air O2  Flow Rate: O2 Flow Rate (L/min): 3 L/min      Palliative Assessment/Data: 30% (on tube feeds)   Palliative Care Assessment & Plan   Patient Profile: 42 y.o. female  with past medical history of insulin-dependent diabetes mellitus, resultant end-stage renal disease, bilateral AKA, blindness and severe peripheral arterial disease admitted on 05/24/2023 with hypotension in dialysis.    She had a recent prolonged hospital stay in September 2024 for small bowel ischemia with resultant small bowel obstruction. She had an extended ileal cecotomy placed. She was discharged to SNF 3 days before this admission.  Per chart, patient was not allowing dressing changes for her abdominal wound.  She is now admitted with septic shock  secondary to suspected wound infection and acute encephalopathy.   PMT has been consulted to assist with goals of care conversation.  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 New CHF with EF of less than 25%  Recommendations/Plan: Continue full code/full scope treatment Patient's husband is hopeful for improvement and ongoing medical management of her severe illness despite limited treatment options Ongoing goals of care discussions PMT will continue to follow    Prognosis: Guarded to poor given several chronic comorbidities, repeat hospitalizations, concerns with adherence to care plan   Discharge Planning: To Be Determined   Total time: I spent 50 minutes in the care of the patient today in the above activities and documenting the encounter.         Maison Agrusa Jeni Salles, PA-C  Palliative Medicine Team Team phone # [phone]  Thank you for allowing the Palliative Medicine Team to assist in the care of this patient. Please utilize secure chat with additional questions, if there is no response within 30 minutes please call the above phone number.  Palliative Medicine Team providers are available by phone from 7am to 7pm daily and can be reached through the team cell phone.  Should this patient require assistance outside of these hours, please call the patient's attending physician.

## 2023-06-05 NOTE — Plan of Care (Signed)
  Problem: Pain Managment: Goal: General experience of comfort will improve Outcome: Progressing   Problem: Skin Integrity: Goal: Risk for impaired skin integrity will decrease Outcome: Progressing   

## 2023-06-05 NOTE — Sedation Documentation (Signed)
As per Dr. Lowella Dandy, no antibiotics will be needed for this case.

## 2023-06-05 NOTE — Progress Notes (Incomplete)
Subjective:  ***  Objective Vital signs in last 24 hours: Vitals:   06/05/23 0536 06/05/23 0813 06/05/23 0858 06/05/23 0900  BP: (!) 88/58 (!) 100/56 119/61 109/69  Pulse: 76 80 77 76  Resp: 17 17 12 11   Temp: 97.8 F (36.6 C) 98.3 F (36.8 C)    TempSrc: Oral Oral    SpO2: 90% 100% 96% 96%  Weight:       Weight change: -0.6 kg  Physical Exam: General: *** Heart: *** Lungs: *** Abdomen: *** Extremities: Dialysis Access: ***   Problem/Plan:  *** ESRD - *** Anemia - *** Secondary hyperparathyroidism - *** HTN/volume - ***  Lenny Pastel, PA-C [facility] [phone] 06/05/2023,9:08 AM  LOS: 9 days   Labs: Basic Metabolic Panel: Recent Labs  Lab 05/30/23 0426 06/03/23 0955  NA 129* 136  K 3.9 4.1  CL 93* 100  CO2 22 23  GLUCOSE 151* 112*  BUN 14 27*  CREATININE 2.76* 4.50*  CALCIUM 9.9 9.5  PHOS 3.9  --    Liver Function Tests: Recent Labs  Lab 05/30/23 0426 06/03/23 0955  AST  --  14*  ALT  --  21  ALKPHOS  --  81  BILITOT  --  1.1  PROT  --  6.0*  ALBUMIN 2.0* 1.8*   No results for input(s): "LIPASE", "AMYLASE" in the last 168 hours. No results for input(s): "AMMONIA" in the last 168 hours. CBC: Recent Labs  Lab 05/29/23 1610 05/30/23 0426 06/03/23 0955  WBC  --  16.1* 12.1*  HGB 8.9* 9.1* 10.6*  HCT 27.2* 27.0* 33.7*  MCV  --  87.9 96.0  PLT  --  231 210   Cardiac Enzymes: No results for input(s): "CKTOTAL", "CKMB", "CKMBINDEX", "TROPONINI" in the last 168 hours. CBG: Recent Labs  Lab 06/04/23 0830 06/04/23 1314 06/04/23 1356 06/04/23 1824 06/05/23 0808  GLUCAP 138* 68* 81 143* 153*    Studies/Results: No results found. Medications:   atorvastatin  20 mg Oral Daily   Chlorhexidine Gluconate Cloth  6 each Topical Daily   Chlorhexidine Gluconate Cloth  6 each Topical Q0600   Chlorhexidine Gluconate Cloth  6 each Topical Q0600   darbepoetin (ARANESP) injection - DIALYSIS  60 mcg Subcutaneous Q  Fri-1800   dextrose  12.5 g Intravenous STAT   dronabinol  5 mg Oral BID AC   heparin  5,000 Units Subcutaneous Q8H   insulin aspart  0-9 Units Subcutaneous TID WC   LORazepam  1 mg Intravenous Once   midodrine  10 mg Oral TID WC

## 2023-06-06 ENCOUNTER — Encounter (HOSPITAL_COMMUNITY): Payer: Self-pay

## 2023-06-06 DIAGNOSIS — A419 Sepsis, unspecified organism: Secondary | ICD-10-CM | POA: Diagnosis not present

## 2023-06-06 DIAGNOSIS — R6521 Severe sepsis with septic shock: Secondary | ICD-10-CM | POA: Diagnosis not present

## 2023-06-06 DIAGNOSIS — N186 End stage renal disease: Secondary | ICD-10-CM | POA: Diagnosis not present

## 2023-06-06 DIAGNOSIS — Z515 Encounter for palliative care: Secondary | ICD-10-CM | POA: Diagnosis not present

## 2023-06-06 DIAGNOSIS — Z7189 Other specified counseling: Secondary | ICD-10-CM | POA: Diagnosis not present

## 2023-06-06 LAB — GLUCOSE, CAPILLARY
Glucose-Capillary: 176 mg/dL — ABNORMAL HIGH (ref 70–99)
Glucose-Capillary: 141 mg/dL — ABNORMAL HIGH (ref 70–99)
Glucose-Capillary: 133 mg/dL — ABNORMAL HIGH (ref 70–99)
Glucose-Capillary: 165 mg/dL — ABNORMAL HIGH (ref 70–99)

## 2023-06-06 LAB — COMPREHENSIVE METABOLIC PANEL
ALT: 17 U/L (ref 0–44)
AST: 15 U/L (ref 15–41)
Albumin: 1.8 g/dL — ABNORMAL LOW (ref 3.5–5.0)
Alkaline Phosphatase: 69 U/L (ref 38–126)
Anion gap: 15 (ref 5–15)
BUN: 33 mg/dL — ABNORMAL HIGH (ref 6–20)
CO2: 24 mmol/L (ref 22–32)
Calcium: 9.1 mg/dL (ref 8.9–10.3)
Chloride: 99 mmol/L (ref 98–111)
Creatinine, Ser: 5.63 mg/dL — ABNORMAL HIGH (ref 0.44–1.00)
GFR, Estimated: 9 mL/min — ABNORMAL LOW (ref >60)
Glucose, Bld: 231 mg/dL — ABNORMAL HIGH (ref 70–99)
Potassium: 4.3 mmol/L (ref 3.5–5.1)
Sodium: 138 mmol/L (ref 135–145)
Total Bilirubin: 0.9 mg/dL (ref 0.3–1.2)
Total Protein: 6.1 g/dL — ABNORMAL LOW (ref 6.5–8.1)

## 2023-06-06 LAB — CBC
HCT: 33.8 % — ABNORMAL LOW (ref 36.0–46.0)
Hemoglobin: 10.4 g/dL — ABNORMAL LOW (ref 12.0–15.0)
MCH: 29.8 pg (ref 26.0–34.0)
MCHC: 30.8 g/dL (ref 30.0–36.0)
MCV: 96.8 fL (ref 80.0–100.0)
Platelets: 250 10*3/uL (ref 150–400)
RBC: 3.49 MIL/uL — ABNORMAL LOW (ref 3.87–5.11)
RDW: 23.7 % — ABNORMAL HIGH (ref 11.5–15.5)
WBC: 12.2 10*3/uL — ABNORMAL HIGH (ref 4.0–10.5)
nRBC: 0.3 % — ABNORMAL HIGH (ref 0.0–0.2)

## 2023-06-06 LAB — MAGNESIUM: Magnesium: 1.8 mg/dL (ref 1.7–2.4)

## 2023-06-06 NOTE — Progress Notes (Signed)
Daily Progress Note   Patient Name: [name]       Date: 06/06/2023 DOB: [birth year]  Age: 42 y.o. MRN#: [id number] Attending Physician: Miguel Rota, MD Primary Care Physician: [facility] Admit Date: 05/31/2023  Reason for Consultation/Follow-up: Establishing goals of care  Subjective: Medical records reviewed including progress notes, labs, and imaging. Patient assessed at the bedside. She reports feeling hungry and wanting chicken.  Discussed with RN who confirms this has been ordered and patient remains confused.  I called patient's husband Alecia Lemming and facilitated a phone conversation between patient and her husband.  Patient is confused, telling her husband she is hurting, then telling him she is all right, then telling him she is good.  She perseverates on wanting chicken.  I encouraged patient to share her goals of care and care preferences with her husband given that she has the opportunity and he is assisting with complex medical decision making.  Specifically, I encouraged communication regarding her wishes on aggressive interventions i.e. CPR, ventilator support, etc.  She stated "he did that" in response.  She then preferred to end the phone conversation due to fatigue and hunger.  Continued conversation with patient's husband Alecia Lemming, providing palliative support and debriefing.  He is very grateful to have had the opportunity speak with her and states "do anything she asks."  He remains hopeful that she improves.  Provided him with contact information for the floor to assist with facilitating further phone conversations while I am off service.  Questions and concerns addressed. PMT will continue to support holistically.   Length of Stay: 10   Physical  Exam Vitals and nursing note reviewed.  Constitutional:      General: She is not in acute distress.    Appearance: She is ill-appearing.  Cardiovascular:     Rate and Rhythm: Normal rate.  Pulmonary:     Effort: Pulmonary effort is normal.  Neurological:     Mental Status: She is easily aroused. She is disoriented and confused.  Psychiatric:        Mood and Affect: Affect is flat.        Cognition and Memory: Cognition is impaired.            Vital Signs: BP (!) 90/53 (BP Location: Left Arm)   Pulse 75   Temp 98.6 F (37 C)   Resp 19   Wt 43.1 kg   SpO2 100%   BMI 17.95 kg/m  SpO2: SpO2: 100 % O2 Device: O2 Device: Room Air O2 Flow Rate: O2 Flow Rate (L/min): 2 L/min      Palliative Assessment/Data: 30% (on tube feeds)   Palliative Care Assessment & Plan   Patient Profile: 42 y.o. female  with past medical history of insulin-dependent diabetes mellitus, resultant end-stage renal disease, bilateral AKA, blindness and severe peripheral arterial disease admitted on 06/05/2023 with hypotension in dialysis.    She had a recent prolonged hospital stay in September 2024 for small bowel ischemia with resultant small bowel obstruction. She had an extended ileal cecotomy placed. She was discharged to SNF 3 days before this admission.  Per chart, patient was not allowing dressing changes for her abdominal  wound.  She is now admitted with septic shock secondary to suspected wound infection and acute encephalopathy.   PMT has been consulted to assist with goals of care conversation.  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 New CHF with EF of less than 25%  Recommendations/Plan: Continue full code/full scope treatment Patient's husband is hopeful for improvement and ongoing medical management of her severe illness, despite limited treatment options PMT will see again  10/28 if still admitted   Prognosis: Guarded to poor given several chronic comorbidities, repeat hospitalizations, concerns with adherence to care plan   Discharge Planning: SNF   Care plan was discussed with patient, patient's husband, RN, MD  Total time: I spent 35 minutes in the care of the patient today in the above activities and documenting the encounter.         Giordan Fordham Jeni Salles, PA-C  Palliative Medicine Team Team phone # [phone]  Thank you for allowing the Palliative Medicine Team to assist in the care of this patient. Please utilize secure chat with additional questions, if there is no response within 30 minutes please call the above phone number.  Palliative Medicine Team providers are available by phone from 7am to 7pm daily and can be reached through the team cell phone.  Should this patient require assistance outside of these hours, please call the patient's attending physician.

## 2023-06-06 NOTE — Plan of Care (Signed)
  Problem: Pain Management: Goal: Pain level will decrease with appropriate interventions Outcome: Progressing   Problem: Skin Integrity: Goal: Demonstration of wound healing without infection will improve Outcome: Progressing   Problem: Nutrition: Goal: Dietary intake will improve Outcome: Progressing

## 2023-06-06 NOTE — Progress Notes (Signed)
   06/06/23 0450  Vitals  BP (!) 97/57  MAP (mmHg) 69  BP Location Right Arm  BP Method Automatic  Patient Position (if appropriate) Lying  Pulse Rate Source Monitor  ECG Heart Rate 74  Resp 14  During Treatment Monitoring  Blood Flow Rate (mL/min) 0 mL/min  Arterial Pressure (mmHg) 1.21 mmHg  Venous Pressure (mmHg) -2.22 mmHg  TMP (mmHg) -52.32 mmHg  Ultrafiltration Rate (mL/min) 676 mL/min  Dialysate Flow Rate (mL/min) 299 ml/min  Duration of HD Treatment -hour(s) 2.96 hour(s)  Cumulative Fluid Removed (mL) per Treatment  871.22  Intra-Hemodialysis Comments Tx completed  Post Treatment  Dialyzer Clearance Lightly streaked  Liters Processed 62.1  Fluid Removed (mL) 900 mL  Tolerated HD Treatment No (Comment)  AVG/AVF Arterial Site Held (minutes) 10 minutes  AVG/AVF Venous Site Held (minutes) 10 minutes  Fistula / Graft Right Upper arm Arteriovenous fistula  No placement date or time found.   Placed prior to admission: Yes  Orientation: Right  Access Location: Upper arm  Access Type: Arteriovenous fistula  Site Condition Other (Comment) (eroding skin integrety)  Fistula / Graft Assessment Bruit;Thrill;Present  Needle Size 15  Drainage Description None   Tylenol 650mg  given for pain during dialysis.

## 2023-06-06 NOTE — Progress Notes (Signed)
Subjective: Seen in room, states"leave me alone" noted yesterday, LUA AVF IR fistulogram with intervention and tolerated dialysis afterwards  Objective Vital signs in last 24 hours: Vitals:   06/06/23 0430 06/06/23 0450 06/06/23 0500 06/06/23 0620  BP: (!) 90/55 (!) 97/57  101/62  Pulse: 76   77  Resp: 10 14  18   Temp:    97.9 F (36.6 C)  TempSrc:    Oral  SpO2:      Weight:   43.1 kg    Weight change: -1.3 kg   Physical Exam: General: Alert chronically ill young female NAD Heart: RRR no MRG Lungs: CTA bilaterally nonlabored breathing Abdomen: NABS nondistended soft, surgical dressings dry clean Extremities: Bil AKA no edema Dialysis Access: RUA  AVF mildly swollen, slightly decreased + bruit   Dialysis Orders: [facility] MWF 80 NR, 400/1.5 auto, 2K/2Ca, EDW 49kg, mircera 75 q2wks    Problem/Plan: 1. Shock: presumed septic, now off pressors, on midodrine. Suspected related to abd wound.  S/p abx, per primary 2. 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 Necrotic tissue noted in wound and around ileostomy.  WC and surgery following.   3. 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  CTA completed. EEG with no seizure like activity. Per Neuro.  4. ESRD - MWF schedule dialysis on schedule. S/p fgram 10/23 with IR: multiple areas of narrowing, s/p balloon angioplasty. Avoid aneurysmal areas 5.HTN/volume - Hypotensive, on midodrine. Volume status improved.  UF as tolerated. 6. Anemia of CKD- Hgb up to 10.6 most recently. s/p 1 unit pRBC. Continue ESA. 7. Secondary hyperparathyroidism - Calcium Correct elevated  and phos in goal. Senispar on hold d/t GI issues.  Not on VDRA.  No current binder. Last po4 acceptable 8. Nutrition - Alb 1.8   ON regular diet.  Monitor labs.  Lenny Pastel, PA-C [facility] Beeper [phone] 06/06/2023,8:30 AM  LOS: 10 days   Labs: Basic Metabolic Panel: Recent Labs  Lab 06/03/23 0955 06/06/23 0130  NA 136 138   K 4.1 4.3  CL 100 99  CO2 23 24  GLUCOSE 112* 231*  BUN 27* 33*  CREATININE 4.50* 5.63*  CALCIUM 9.5 9.1   Liver Function Tests: Recent Labs  Lab 06/03/23 0955 06/06/23 0130  AST 14* 15  ALT 21 17  ALKPHOS 81 69  BILITOT 1.1 0.9  PROT 6.0* 6.1*  ALBUMIN 1.8* 1.8*   No results for input(s): "LIPASE", "AMYLASE" in the last 168 hours. No results for input(s): "AMMONIA" in the last 168 hours. CBC: Recent Labs  Lab 06/03/23 0955 06/06/23 0130  WBC 12.1* 12.2*  HGB 10.6* 10.4*  HCT 33.7* 33.8*  MCV 96.0 96.8  PLT 210 250   Cardiac Enzymes: No results for input(s): "CKTOTAL", "CKMB", "CKMBINDEX", "TROPONINI" in the last 168 hours. CBG: Recent Labs  Lab 06/05/23 0808 06/05/23 1216 06/05/23 1559 06/05/23 2003 06/06/23 0816  GLUCAP 153* 105* 120* 128* 176*    Studies/Results: IR AV DIALY SHUNT INTRO NEEDLE/INTRAC INITIAL W/PTA/STENT/IMG RIGHT  Result Date: 06/05/2023 INDICATION: 42 year old with end-stage renal disease and poor flows during dialysis. Patient has a right upper extremity brachiocephalic fistula. Right cephalic vein and right central veins were recently angioplastied on 05/14/2023. EXAM: 1. Right upper extremity fistulogram 2. Balloon angioplasty of right cephalic vein and right central veins 3. Ultrasound guidance for vascular access MEDICATIONS: Moderate sedation ANESTHESIA/SEDATION: Moderate (conscious) sedation was employed during this procedure. A total of Versed 0.5 mg and Fentanyl 25 mcg was administered intravenously by the  radiology nurse. Total intra-service moderate Sedation Time: 47 minutes. The patient's level of consciousness and vital signs were monitored continuously by radiology nursing throughout the procedure under my direct supervision. FLUOROSCOPY: Radiation Exposure Index (as provided by the fluoroscopic device): 27 mGy Kerma CONTRAST:  100 mL Omnipaque 300 COMPLICATIONS: None immediate. PROCEDURE: Informed consent was obtained. A timeout  was performed prior to the initiation of the procedure. Patient was placed supine on the interventional table. The right upper arm was prepped and draped in sterile fashion. Maximal barrier sterile technique was utilized including caps, mask, sterile gowns, sterile gloves, sterile drape, hand hygiene and skin antiseptic. Ultrasound confirmed a patent right brachiocephalic fistula and ultrasound image was saved for documentation. Skin was anesthetized near the right elbow with 1% lidocaine. Using ultrasound guidance, 21 gauge needle was directed into the cephalic vein and micropuncture dilator set was placed. Right upper extremity fistulogram images were obtained. 7 [place] vascular sheath was placed over a Bentson wire. Kumpe catheter was used to advance the wire into the SVC. The right innominate vein, right subclavian vein and right cephalic arch were angioplastied using a 7 mm x 40 mm Mustang balloon. Follow-up fistulogram images were obtained. Right innominate vein and right subclavian vein were then angioplastied with a 10 mm x 40 mm mustang balloon. Follow-up fistulogram images were obtained. Right innominate vein and right subclavian vein were angioplastied with a 12 mm x 40 mm Mustang balloon and follow-up fistulogram images were obtained. Vascular sheath was removed with a pursestring suture. Bandage was placed. FINDINGS: Right brachiocephalic fistula is patent with an irregular saccular aneurysm involving the mid humeral cephalic vein. These findings are similar to the recent comparison examination. Greater than 50% stenosis involving the cephalic arch. Again noted is narrowing and irregularity involving the right subclavian vein and right innominate vein. Narrowing in the right innominate vein appears to be close to 50%. Again noted are collateral vessels in the right axillary region. SVC is patent. Cephalic vein in the lower humeral region is heavily calcified and the arterial anastomosis is patent.  Following balloon angioplasty of the cephalic arch and the right central veins, there is mildly improved flow in both areas. There continues to be mild irregularity and narrowing throughout these areas and persistent filling of the collateral vessels. IMPRESSION: 1. Right upper extremity brachiocephalic fistula is patent. Residual and recurrent areas of narrowing involving the cephalic vein arch and the right central veins. These areas were treated with balloon angioplasty. Cephalic vein was angioplastied up to 7 mm and the right central veins were angioplastied up to 12 mm. Mildly improved flow in both areas following balloon angioplasty. 2. 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 Findings are similar to the previous examination. It appears this aneurysm is getting accessed during dialysis and likely contributes to the poor flow. Recommend avoiding this area for dialysis access. Consider surgical consultation of this aneurysm. ACCESS: This access remains amenable to future percutaneous interventions as clinically indicated. Electronically Signed   By: Richarda Overlie M.D.   On: 06/05/2023 15:07   IR US Guide Vasc Access Right  Result Date: 06/05/2023 INDICATION: 42 year old with end-stage renal disease and poor flows during dialysis. Patient has a right upper extremity brachiocephalic fistula. Right cephalic vein and right central veins were recently angioplastied on 05/14/2023. EXAM: 1. Right upper extremity fistulogram 2. Balloon angioplasty of right cephalic vein and right central veins 3. Ultrasound guidance for vascular access MEDICATIONS: Moderate sedation ANESTHESIA/SEDATION: Moderate (conscious)  sedation was employed during this procedure. A total of Versed 0.5 mg and Fentanyl 25 mcg was administered intravenously by the radiology nurse. Total intra-service moderate Sedation Time: 47 minutes. The patient's level of consciousness and vital signs were monitored  continuously by radiology nursing throughout the procedure under my direct supervision. FLUOROSCOPY: Radiation Exposure Index (as provided by the fluoroscopic device): 27 mGy Kerma CONTRAST:  100 mL Omnipaque 300 COMPLICATIONS: None immediate. PROCEDURE: Informed consent was obtained. A timeout was performed prior to the initiation of the procedure. Patient was placed supine on the interventional table. The right upper arm was prepped and draped in sterile fashion. Maximal barrier sterile technique was utilized including caps, mask, sterile gowns, sterile gloves, sterile drape, hand hygiene and skin antiseptic. Ultrasound confirmed a patent right brachiocephalic fistula and ultrasound image was saved for documentation. Skin was anesthetized near the right elbow with 1% lidocaine. Using ultrasound guidance, 21 gauge needle was directed into the cephalic vein and micropuncture dilator set was placed. Right upper extremity fistulogram images were obtained. 7 [place] vascular sheath was placed over a Bentson wire. Kumpe catheter was used to advance the wire into the SVC. The right innominate vein, right subclavian vein and right cephalic arch were angioplastied using a 7 mm x 40 mm Mustang balloon. Follow-up fistulogram images were obtained. Right innominate vein and right subclavian vein were then angioplastied with a 10 mm x 40 mm mustang balloon. Follow-up fistulogram images were obtained. Right innominate vein and right subclavian vein were angioplastied with a 12 mm x 40 mm Mustang balloon and follow-up fistulogram images were obtained. Vascular sheath was removed with a pursestring suture. Bandage was placed. FINDINGS: Right brachiocephalic fistula is patent with an irregular saccular aneurysm involving the mid humeral cephalic vein. These findings are similar to the recent comparison examination. Greater than 50% stenosis involving the cephalic arch. Again noted is narrowing and irregularity involving the right  subclavian vein and right innominate vein. Narrowing in the right innominate vein appears to be close to 50%. Again noted are collateral vessels in the right axillary region. SVC is patent. Cephalic vein in the lower humeral region is heavily calcified and the arterial anastomosis is patent. Following balloon angioplasty of the cephalic arch and the right central veins, there is mildly improved flow in both areas. There continues to be mild irregularity and narrowing throughout these areas and persistent filling of the collateral vessels. IMPRESSION: 1. Right upper extremity brachiocephalic fistula is patent. Residual and recurrent areas of narrowing involving the cephalic vein arch and the right central veins. These areas were treated with balloon angioplasty. Cephalic vein was angioplastied up to 7 mm and the right central veins were angioplastied up to 12 mm. Mildly improved flow in both areas following balloon angioplasty. 2. 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 Findings are similar to the previous examination. It appears this aneurysm is getting accessed during dialysis and likely contributes to the poor flow. Recommend avoiding this area for dialysis access. Consider surgical consultation of this aneurysm. ACCESS: This access remains amenable to future percutaneous interventions as clinically indicated. Electronically Signed   By: Richarda Overlie M.D.   On: 06/05/2023 15:07   Medications:   atorvastatin  20 mg Oral Daily   Chlorhexidine Gluconate Cloth  6 each Topical Q0600   darbepoetin (ARANESP) injection - DIALYSIS  60 mcg Subcutaneous Q Fri-1800   dorzolamide-timolol  1 drop Left Eye BID   dronabinol  5 mg Oral BID  AC   heparin  5,000 Units Subcutaneous Q8H   LORazepam  1 mg Intravenous Once   midodrine  10 mg Oral TID WC

## 2023-06-06 NOTE — Progress Notes (Signed)
PROGRESS NOTE    [name]  GUY:[id number] DOB: July 25, 1981 DOA: 05/22/2023 PCP: [facility]     Brief Narrative:  42 year old with history of DM2, ESRD, bilateral AKA, blindness, PAD initially presented with hypotension from dialysis.  Had a recent prolonged stay in the hospital in September 2024 for small bowel ischemia complicated by SBO and had extended ileocecectomy and discharged to SNF.  Patient has had HD catheter in the right chest wall due to difficulty accessing her AV fistula.  Eventually at SNF found to be also hypotensive during her HD therefore admitted to the ICU and PCCM was initially consulted.  Eventually she was weaned off pressors on 10/16 and transferred to [facility].  During this time it has been very difficult to get in touch with patient's [facility] POA, Sister [name] to get any formal consents.  Eventually ethics was consulted and stated next point of contact should be her husband.  Assessment & Plan:   Principal Problem:   Septic shock (HCC)   Goals of care discussion  Difficulty getting touch with patient's healthcare power of attorney, [name] sister.  Ethics consulted, okay to contact patient's husband as patient is still unable to make decision and he would be next of kin  New onset congestive heart failure with reduced EF 25% -Echocardiogram 2023 had preserved EF, echo done on 05/29/2023 showed EF of 25%.  New change, seen by cardiology but due to multiple comorbidities, continue current management.  No further intervention planned.   End-stage renal disease, likely diabetic nephropathy HD per nephrology.  AVF versus use of HD catheter per nephrology Status post IR right arm fistulogram with balloon angioplasty   Septic Shock secondary to suspected wound/skin/soft tissue infection, POA Acute metabolic encephalopathy Patient has completed all antibiotic course at this point.  Currently on midodrine 10 mg 3 times daily.   Type 2  diabetes Peripheral vascular disease Recent small bowel ischemia Bilateral AKA Sliding scale and Accu-Chek   Punctate acute infarct R frontal love, acute to subacute infarct right parieto occipital infarct: EEG negative, seen by neurology.  LDL 19, A1c 5.7.   DVT prophylaxis: heparin injection 5,000 Units Start: 06/04/2023 2200 Code Status:   Code Status: Full Code Family Communication: None present Continue hospital stay until HD access has been addressed.              Subjective:  Seen at bedside, no complaints. She wants me to leave the room because she wants to rest.  Examination:  General exam: Chronically appearing Respiratory system: Clear to auscultation. Respiratory effort normal. Cardiovascular system: S1 & S2 heard, RRR. No JVD, murmurs, rubs, gallops or clicks. No pedal edema. Gastrointestinal system: Abdomen is nondistended, soft and nontender. No organomegaly or masses felt. Normal bowel sounds heard. Central nervous system: Alert and oriented. No focal neurological deficits. Extremities: Right AKA Skin: No rashes, lesions or ulcers Right upper extremity AV fistula  Pressure Injury 05/20/2023 Buttocks Left Stage 2 -  Partial thickness loss of dermis presenting as a shallow open injury with a red, pink wound bed without slough. (Active)  06/10/2023 2128  Location: Buttocks  Location Orientation: Left  Staging: Stage 2 -  Partial thickness loss of dermis presenting as a shallow open injury with a red, pink wound bed without slough.  Wound Description (Comments):   Present on Admission: Yes     Pressure Injury 05/17/2023 Sacrum Stage 1 -  Intact skin with non-blanchable redness of a localized area usually over a  bony prominence. Old, healed pressure injury (Active)  05/15/2023 2128  Location: Sacrum  Location Orientation:   Staging: Stage 1 -  Intact skin with non-blanchable redness of a localized area usually over a bony prominence.  Wound Description  (Comments): Old, healed pressure injury  Present on Admission: Yes     Diet Orders (From admission, onward)     Start     Ordered   06/05/23 1144  Diet regular Room service appropriate? Yes; Fluid consistency: Thin  Diet effective now       Question Answer Comment  Room service appropriate? Yes   Fluid consistency: Thin      06/05/23 1143            Objective: Vitals:   06/06/23 0450 06/06/23 0500 06/06/23 0620 06/06/23 0837  BP: (!) 97/57  101/62 (!) 90/53  Pulse:   77 75  Resp: 14  18 19   Temp:   97.9 F (36.6 C) 98.6 F (37 C)  TempSrc:   Oral   SpO2:    100%  Weight:  43.1 kg      Intake/Output Summary (Last 24 hours) at 06/06/2023 1227 Last data filed at 06/06/2023 0930 Gross per 24 hour  Intake 0 ml  Output 1400 ml  Net -1400 ml   Filed Weights   06/04/23 0700 06/05/23 0500 06/06/23 0500  Weight: 45 kg 44.4 kg 43.1 kg    Scheduled Meds:  atorvastatin  20 mg Oral Daily   Chlorhexidine Gluconate Cloth  6 each Topical Q0600   darbepoetin (ARANESP) injection - DIALYSIS  60 mcg Subcutaneous Q Fri-1800   dorzolamide-timolol  1 drop Left Eye BID   dronabinol  5 mg Oral BID AC   heparin  5,000 Units Subcutaneous Q8H   LORazepam  1 mg Intravenous Once   midodrine  10 mg Oral TID WC   Continuous Infusions:  Nutritional status     Body mass index is 17.95 kg/m.  Data Reviewed:   CBC: Recent Labs  Lab 06/03/23 0955 06/06/23 0130  WBC 12.1* 12.2*  HGB 10.6* 10.4*  HCT 33.7* 33.8*  MCV 96.0 96.8  PLT 210 250   Basic Metabolic Panel: Recent Labs  Lab 06/03/23 0955 06/06/23 0130  NA 136 138  K 4.1 4.3  CL 100 99  CO2 23 24  GLUCOSE 112* 231*  BUN 27* 33*  CREATININE 4.50* 5.63*  CALCIUM 9.5 9.1  MG  --  1.8   GFR: Estimated Creatinine Clearance: 8.9 mL/min (A) (by C-G formula based on SCr of 5.63 mg/dL (H)). Liver Function Tests: Recent Labs  Lab 06/03/23 0955 06/06/23 0130  AST 14* 15  ALT 21 17  ALKPHOS 81 69  BILITOT 1.1  0.9  PROT 6.0* 6.1*  ALBUMIN 1.8* 1.8*   No results for input(s): "LIPASE", "AMYLASE" in the last 168 hours. No results for input(s): "AMMONIA" in the last 168 hours. Coagulation Profile: No results for input(s): "INR", "PROTIME" in the last 168 hours. Cardiac Enzymes: No results for input(s): "CKTOTAL", "CKMB", "CKMBINDEX", "TROPONINI" in the last 168 hours. BNP (last 3 results) No results for input(s): "PROBNP" in the last 8760 hours. HbA1C: No results for input(s): "HGBA1C" in the last 72 hours. CBG: Recent Labs  Lab 06/05/23 0808 06/05/23 1216 06/05/23 1559 06/05/23 2003 06/06/23 0816  GLUCAP 153* 105* 120* 128* 176*   Lipid Profile: No results for input(s): "CHOL", "HDL", "LDLCALC", "TRIG", "CHOLHDL", "LDLDIRECT" in the last 72 hours. Thyroid Function Tests: No results for input(s): "  TSH", "T4TOTAL", "FREET4", "T3FREE", "THYROIDAB" in the last 72 hours. Anemia Panel: No results for input(s): "VITAMINB12", "FOLATE", "FERRITIN", "TIBC", "IRON", "RETICCTPCT" in the last 72 hours. Sepsis Labs: No results for input(s): "PROCALCITON", "LATICACIDVEN" in the last 168 hours.  Recent Results (from the past 240 hour(s))  Blood culture (routine x 2)     Status: None   Collection Time: 05/15/2023  3:10 PM   Specimen: BLOOD LEFT ARM  Result Value Ref Range Status   Specimen Description BLOOD LEFT ARM  Final   Special Requests   Final    BOTTLES DRAWN AEROBIC AND ANAEROBIC Blood Culture adequate volume   Culture   Final    NO GROWTH 5 DAYS Performed at [facility], [address] [place], [place] [address]    Report Status 06/01/2023 FINAL  Final  Blood culture (routine x 2)     Status: None   Collection Time: 05/15/2023  3:27 PM   Specimen: BLOOD LEFT ARM  Result Value Ref Range Status   Specimen Description BLOOD LEFT ARM  Final   Special Requests   Final    BOTTLES DRAWN AEROBIC AND ANAEROBIC Blood Culture adequate volume   Culture   Final    NO GROWTH 5  DAYS Performed at [facility], [address], [place], [place] [address]    Report Status 06/01/2023 FINAL  Final  MRSA Next Gen by PCR, Nasal     Status: None   Collection Time: 05/23/2023  9:21 PM   Specimen: Nasal Mucosa; Nasal Swab  Result Value Ref Range Status   MRSA by PCR Next Gen NOT DETECTED NOT DETECTED Final    Comment: (NOTE) The GeneXpert MRSA Assay (FDA approved for NASAL specimens only), is one component of a comprehensive MRSA colonization surveillance program. It is not intended to diagnose MRSA infection nor to guide or monitor treatment for MRSA infections. Test performance is not FDA approved in patients less than 78 years old. Performed at [facility], [address] [place], [place] [address]          Radiology Studies: IR AV DIALY SHUNT INTRO NEEDLE/INTRAC INITIAL W/PTA/STENT/IMG RIGHT  Result Date: 06/05/2023 INDICATION: 42 year old with end-stage renal disease and poor flows during dialysis. Patient has a right upper extremity brachiocephalic fistula. Right cephalic vein and right central veins were recently angioplastied on 05/14/2023. EXAM: 1. Right upper extremity fistulogram 2. Balloon angioplasty of right cephalic vein and right central veins 3. Ultrasound guidance for vascular access MEDICATIONS: Moderate sedation ANESTHESIA/SEDATION: Moderate (conscious) sedation was employed during this procedure. A total of Versed 0.5 mg and Fentanyl 25 mcg was administered intravenously by the radiology nurse. Total intra-service moderate Sedation Time: 47 minutes. The patient's level of consciousness and vital signs were monitored continuously by radiology nursing throughout the procedure under my direct supervision. FLUOROSCOPY: Radiation Exposure Index (as provided by the fluoroscopic device): 27 mGy Kerma CONTRAST:  100 mL Omnipaque 300 COMPLICATIONS: None immediate. PROCEDURE: Informed consent was obtained. A timeout was performed prior to the  initiation of the procedure. Patient was placed supine on the interventional table. The right upper arm was prepped and draped in sterile fashion. Maximal barrier sterile technique was utilized including caps, mask, sterile gowns, sterile gloves, sterile drape, hand hygiene and skin antiseptic. Ultrasound confirmed a patent right brachiocephalic fistula and ultrasound image was saved for documentation. Skin was anesthetized near the right elbow with 1% lidocaine. Using ultrasound guidance, 21 gauge needle was directed into the  cephalic vein and micropuncture dilator set was placed. Right upper extremity fistulogram images were obtained. 7 [place] vascular sheath was placed over a Bentson wire. Kumpe catheter was used to advance the wire into the SVC. The right innominate vein, right subclavian vein and right cephalic arch were angioplastied using a 7 mm x 40 mm Mustang balloon. Follow-up fistulogram images were obtained. Right innominate vein and right subclavian vein were then angioplastied with a 10 mm x 40 mm mustang balloon. Follow-up fistulogram images were obtained. Right innominate vein and right subclavian vein were angioplastied with a 12 mm x 40 mm Mustang balloon and follow-up fistulogram images were obtained. Vascular sheath was removed with a pursestring suture. Bandage was placed. FINDINGS: Right brachiocephalic fistula is patent with an irregular saccular aneurysm involving the mid humeral cephalic vein. These findings are similar to the recent comparison examination. Greater than 50% stenosis involving the cephalic arch. Again noted is narrowing and irregularity involving the right subclavian vein and right innominate vein. Narrowing in the right innominate vein appears to be close to 50%. Again noted are collateral vessels in the right axillary region. SVC is patent. Cephalic vein in the lower humeral region is heavily calcified and the arterial anastomosis is patent. Following balloon angioplasty of  the cephalic arch and the right central veins, there is mildly improved flow in both areas. There continues to be mild irregularity and narrowing throughout these areas and persistent filling of the collateral vessels. IMPRESSION: 1. Right upper extremity brachiocephalic fistula is patent. Residual and recurrent areas of narrowing involving the cephalic vein arch and the right central veins. These areas were treated with balloon angioplasty. Cephalic vein was angioplastied up to 7 mm and the right central veins were angioplastied up to 12 mm. Mildly improved flow in both areas following balloon angioplasty. 2. 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 Findings are similar to the previous examination. It appears this aneurysm is getting accessed during dialysis and likely contributes to the poor flow. Recommend avoiding this area for dialysis access. Consider surgical consultation of this aneurysm. ACCESS: This access remains amenable to future percutaneous interventions as clinically indicated. Electronically Signed   By: Richarda Overlie M.D.   On: 06/05/2023 15:07   IR US Guide Vasc Access Right  Result Date: 06/05/2023 INDICATION: 42 year old with end-stage renal disease and poor flows during dialysis. Patient has a right upper extremity brachiocephalic fistula. Right cephalic vein and right central veins were recently angioplastied on 05/14/2023. EXAM: 1. Right upper extremity fistulogram 2. Balloon angioplasty of right cephalic vein and right central veins 3. Ultrasound guidance for vascular access MEDICATIONS: Moderate sedation ANESTHESIA/SEDATION: Moderate (conscious) sedation was employed during this procedure. A total of Versed 0.5 mg and Fentanyl 25 mcg was administered intravenously by the radiology nurse. Total intra-service moderate Sedation Time: 47 minutes. The patient's level of consciousness and vital signs were monitored continuously by radiology nursing  throughout the procedure under my direct supervision. FLUOROSCOPY: Radiation Exposure Index (as provided by the fluoroscopic device): 27 mGy Kerma CONTRAST:  100 mL Omnipaque 300 COMPLICATIONS: None immediate. PROCEDURE: Informed consent was obtained. A timeout was performed prior to the initiation of the procedure. Patient was placed supine on the interventional table. The right upper arm was prepped and draped in sterile fashion. Maximal barrier sterile technique was utilized including caps, mask, sterile gowns, sterile gloves, sterile drape, hand hygiene and skin antiseptic. Ultrasound confirmed a patent right brachiocephalic fistula and ultrasound image was saved for  documentation. Skin was anesthetized near the right elbow with 1% lidocaine. Using ultrasound guidance, 21 gauge needle was directed into the cephalic vein and micropuncture dilator set was placed. Right upper extremity fistulogram images were obtained. 7 [place] vascular sheath was placed over a Bentson wire. Kumpe catheter was used to advance the wire into the SVC. The right innominate vein, right subclavian vein and right cephalic arch were angioplastied using a 7 mm x 40 mm Mustang balloon. Follow-up fistulogram images were obtained. Right innominate vein and right subclavian vein were then angioplastied with a 10 mm x 40 mm mustang balloon. Follow-up fistulogram images were obtained. Right innominate vein and right subclavian vein were angioplastied with a 12 mm x 40 mm Mustang balloon and follow-up fistulogram images were obtained. Vascular sheath was removed with a pursestring suture. Bandage was placed. FINDINGS: Right brachiocephalic fistula is patent with an irregular saccular aneurysm involving the mid humeral cephalic vein. These findings are similar to the recent comparison examination. Greater than 50% stenosis involving the cephalic arch. Again noted is narrowing and irregularity involving the right subclavian vein and right innominate  vein. Narrowing in the right innominate vein appears to be close to 50%. Again noted are collateral vessels in the right axillary region. SVC is patent. Cephalic vein in the lower humeral region is heavily calcified and the arterial anastomosis is patent. Following balloon angioplasty of the cephalic arch and the right central veins, there is mildly improved flow in both areas. There continues to be mild irregularity and narrowing throughout these areas and persistent filling of the collateral vessels. IMPRESSION: 1. Right upper extremity brachiocephalic fistula is patent. Residual and recurrent areas of narrowing involving the cephalic vein arch and the right central veins. These areas were treated with balloon angioplasty. Cephalic vein was angioplastied up to 7 mm and the right central veins were angioplastied up to 12 mm. Mildly improved flow in both areas following balloon angioplasty. 2. 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 Findings are similar to the previous examination. It appears this aneurysm is getting accessed during dialysis and likely contributes to the poor flow. Recommend avoiding this area for dialysis access. Consider surgical consultation of this aneurysm. ACCESS: This access remains amenable to future percutaneous interventions as clinically indicated. Electronically Signed   By: Richarda Overlie M.D.   On: 06/05/2023 15:07           LOS: 10 days   Time spent= 35 mins    Miguel Rota, MD [facility]  If 7PM-7AM, please contact night-coverage  06/06/2023, 12:27 PM

## 2023-06-07 ENCOUNTER — Encounter (HOSPITAL_COMMUNITY): Payer: Self-pay | Admitting: Internal Medicine

## 2023-06-07 DIAGNOSIS — A419 Sepsis, unspecified organism: Secondary | ICD-10-CM | POA: Diagnosis not present

## 2023-06-07 DIAGNOSIS — R6521 Severe sepsis with septic shock: Secondary | ICD-10-CM | POA: Diagnosis not present

## 2023-06-07 LAB — GLUCOSE, CAPILLARY
Glucose-Capillary: 131 mg/dL — ABNORMAL HIGH (ref 70–99)
Glucose-Capillary: 145 mg/dL — ABNORMAL HIGH (ref 70–99)
Glucose-Capillary: 226 mg/dL — ABNORMAL HIGH (ref 70–99)

## 2023-06-07 LAB — COMPREHENSIVE METABOLIC PANEL
ALT: 16 U/L (ref 0–44)
AST: 19 U/L (ref 15–41)
Albumin: 2.9 g/dL — ABNORMAL LOW (ref 3.5–5.0)
Alkaline Phosphatase: 76 U/L (ref 38–126)
Anion gap: 19 — ABNORMAL HIGH (ref 5–15)
BUN: 24 mg/dL — ABNORMAL HIGH (ref 6–20)
CO2: 23 mmol/L (ref 22–32)
Calcium: 10.1 mg/dL (ref 8.9–10.3)
Chloride: 97 mmol/L — ABNORMAL LOW (ref 98–111)
Creatinine, Ser: 4.73 mg/dL — ABNORMAL HIGH (ref 0.44–1.00)
GFR, Estimated: 11 mL/min — ABNORMAL LOW (ref >60)
Glucose, Bld: 135 mg/dL — ABNORMAL HIGH (ref 70–99)
Potassium: 4.6 mmol/L (ref 3.5–5.1)
Sodium: 139 mmol/L (ref 135–145)
Total Bilirubin: 1 mg/dL (ref 0.3–1.2)
Total Protein: 7.2 g/dL (ref 6.5–8.1)

## 2023-06-07 LAB — CBC
HCT: 35 % — ABNORMAL LOW (ref 36.0–46.0)
Hemoglobin: 10.8 g/dL — ABNORMAL LOW (ref 12.0–15.0)
MCH: 29.7 pg (ref 26.0–34.0)
MCHC: 30.9 g/dL (ref 30.0–36.0)
MCV: 96.2 fL (ref 80.0–100.0)
Platelets: 255 10*3/uL (ref 150–400)
RBC: 3.64 MIL/uL — ABNORMAL LOW (ref 3.87–5.11)
RDW: 22.6 % — ABNORMAL HIGH (ref 11.5–15.5)
WBC: 11 10*3/uL — ABNORMAL HIGH (ref 4.0–10.5)
nRBC: 0.5 % — ABNORMAL HIGH (ref 0.0–0.2)

## 2023-06-07 LAB — MAGNESIUM: Magnesium: 1.8 mg/dL (ref 1.7–2.4)

## 2023-06-07 LAB — PHOSPHORUS: Phosphorus: 5.3 mg/dL — ABNORMAL HIGH (ref 2.5–4.6)

## 2023-06-07 MED ORDER — NEPRO/CARBSTEADY PO LIQD
237.0000 mL | Freq: Two times a day (BID) | ORAL | Status: DC
  Administered 2023-06-07 – 2023-06-17 (×12): 237 mL via ORAL

## 2023-06-07 MED ORDER — ALBUMIN HUMAN 25 % IV SOLN
25.0000 g | Freq: Once | INTRAVENOUS | Status: AC
  Administered 2023-06-07: 25 g via INTRAVENOUS
  Filled 2023-06-07: qty 100

## 2023-06-07 MED ORDER — ASPIRIN 81 MG PO TBEC
81.0000 mg | DELAYED_RELEASE_TABLET | Freq: Every day | ORAL | Status: DC
  Administered 2023-06-07 – 2023-06-17 (×11): 81 mg via ORAL
  Filled 2023-06-07 (×11): qty 1

## 2023-06-07 NOTE — Consult Note (Signed)
WOC Nurse ostomy follow up; Eakin placed 10/22 leaking  Stoma type/location: RMQ ileostomy  Stomal assessment/size: 1" flush stoma Peristomal assessment: necrotic tissue surrounding stoma, midline incision to left of stoma that also has necrotic tissue  Treatment options for stomal/peristomal skin: crusted with stoma powder and no sting barrier wipe, cut a piece of Eakin and placed in between incision and stoma; placed 2" barrier ring around stoma itself. Placed small Eakin, covered with hot pack to help seal to skin.  Patient continuously yelling she is in pain and she is "done" during entire process although she had been medicated for pain prior to my arrival.  CCS PA in and was able to assess area while Eakin was being changed.    Did connect Eakin back to suction and secured NG tube with waterproof tape.  patient lying on her R side so hoping with suction can keep from leaking into wound.    Output yellow green effluent   There is another small Eakin in room as well as a Small Eakin that can be used for strips. I also placed stoma powder [place] #6) in room for crusting as well as the no sting barrier wipes.  Pattern for Eakin is in room as well and some barrier rings.    WOC team will continue to follow to assist staff in caring for this difficult stoma.   Thank you,     Priscella Mann MSN, RN-BC, [employer] [phone]

## 2023-06-07 NOTE — Progress Notes (Signed)
  [facility] Progress Note   Subjective:   Seen in room - was hypotensive this AM and given Iv albumin. Then, refused HD. She says that she is willing to come for HD tomorrow. Denies CP/dyspnea.  Objective Vitals:   06/07/23 0058 06/07/23 0356 06/07/23 0405 06/07/23 0650  BP: (!) 99/57 (!) 86/72  100/80  Pulse: 68 77    Resp: 20 20    Temp: 98.5 F (36.9 C) 98 F (36.7 C)    TempSrc: Oral Oral    SpO2:      Weight:   43.8 kg    Physical Exam General: Chronically ill appearing woman, NAD Heart: RRR; no murmur Lungs: CTA anteriorly Abdomen: dressings/wound vac in place Extremities: B AKA; no edema Dialysis Access: RUE AVF + bruit  Additional Objective Labs: Basic Metabolic Panel: Recent Labs  Lab 06/03/23 0955 06/06/23 0130 06/07/23 0911  NA 136 138 139  K 4.1 4.3 4.6  CL 100 99 97*  CO2 23 24 23   GLUCOSE 112* 231* 135*  BUN 27* 33* 24*  CREATININE 4.50* 5.63* 4.73*  CALCIUM 9.5 9.1 10.1   Liver Function Tests: Recent Labs  Lab 06/03/23 0955 06/06/23 0130 06/07/23 0911  AST 14* 15 19  ALT 21 17 16   ALKPHOS 81 69 76  BILITOT 1.1 0.9 1.0  PROT 6.0* 6.1* 7.2  ALBUMIN 1.8* 1.8* 2.9*   CBC: Recent Labs  Lab 06/03/23 0955 06/06/23 0130 06/07/23 0911  WBC 12.1* 12.2* 11.0*  HGB 10.6* 10.4* 10.8*  HCT 33.7* 33.8* 35.0*  MCV 96.0 96.8 96.2  PLT 210 250 255   Medications:   aspirin EC  81 mg Oral Daily   atorvastatin  20 mg Oral Daily   Chlorhexidine Gluconate Cloth  6 each Topical Q0600   darbepoetin (ARANESP) injection - DIALYSIS  60 mcg Subcutaneous Q Fri-1800   dorzolamide-timolol  1 drop Left Eye BID   dronabinol  5 mg Oral BID AC   heparin  5,000 Units Subcutaneous Q8H   LORazepam  1 mg Intravenous Once   midodrine  10 mg Oral TID WC   Dialysis Orders: [facility] MWF  400/1.5 auto, 2K/2Ca, EDW 49kg, mircera 75 q2wks    Problem/Plan: 1. Shock: presumed septic, now off pressors, on midodrine. Suspected related to abd wound.  S/p  abx, per primary 2. 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 Necrotic tissue noted in wound and around ileostomy.  WC and surgery following.   3. 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  CTA completed. EEG with no seizure like activity. Per Neuro.  4. ESRD - MWF schedule dialysis on schedule. S/p fgram 10/23 with IR: multiple areas of narrowing, s/p balloon angioplasty. Refused HD today -> reschedueld to tomorrow 5. HTN/volume - Hypotensive, on midodrine. Volume status improved.  UF as tolerated. 6. Anemia of CKD- Hgb 10.8 - continue Aranesp q Fri 7. Secondary hyperparathyroidism - CorrCa high. Binders on hold. Senispar on hold d/t GI issues. Not on VDRA.  Repeat PTH and Phos. 8. Nutrition - Alb 2.9, continue supps.  Ozzie Hoyle, PA-C 06/07/2023, 12:11 PM  [employer]

## 2023-06-07 NOTE — Plan of Care (Signed)
  Problem: Education: Goal: Knowledge of the prescribed therapeutic regimen will improve Outcome: Not Progressing   Problem: Self-Care: Goal: Ability to meet self-care needs will improve Outcome: Not Progressing   Problem: Pain Management: Goal: Pain level will decrease with appropriate interventions Outcome: Not Progressing   Problem: Nutrition: Goal: Adequate nutrition will be maintained Outcome: Not Progressing   Problem: Pain Managment: Goal: General experience of comfort will improve Outcome: Not Progressing

## 2023-06-07 NOTE — Progress Notes (Signed)
Most recent vitals at 2223 are 100/56, patient states she feels fine but would like pain medication. Provider would like vitals retaken in an hour.    06/06/23 1923  Vitals  Temp 98.7 F (37.1 C)  Temp Source Oral  BP (!) 80/59  MAP (mmHg) 68  BP Location Left Arm  BP Method Automatic  Patient Position (if appropriate) Lying  Pulse Rate 74  Resp 16  MEWS COLOR  MEWS Score Color Yellow  Oxygen Therapy  O2 Device Room Air  MEWS Score  MEWS Temp 0  MEWS Systolic 2  MEWS Pulse 0  MEWS RR 0  MEWS LOC 0  MEWS Score 2  Provider Notification  Provider Name/Title A. Virgel Manifold, NP  Date Provider Notified 06/06/23  Time Provider Notified 2330  Method of Notification Page  Notification Reason Other (Comment) (New MEWS Yellow)  Provider response Other (Comment) (Recheck blood pressure in an hour)  Date of Provider Response 06/07/23  Time of Provider Response [phone]

## 2023-06-07 NOTE — Plan of Care (Signed)
  Problem: Education: Goal: Knowledge of the prescribed therapeutic regimen will improve Outcome: Not Progressing   Problem: Self-Care: Goal: Ability to meet self-care needs will improve Outcome: Not Progressing   Problem: Pain Management: Goal: Pain level will decrease with appropriate interventions Outcome: Not Progressing   Problem: Nutrition: Goal: Adequate nutrition will be maintained Outcome: Not Progressing   Problem: Pain Managment: Goal: General experience of comfort will improve Outcome: Not Progressing   Problem: Education: Goal: Knowledge of disease or condition will improve Outcome: Not Progressing Goal: Knowledge of secondary prevention will improve (MUST DOCUMENT ALL) Outcome: Not Progressing Goal: Knowledge of patient specific risk factors will improve [name] N/A or DELETE if not current risk factor) Outcome: Not Progressing   Problem: Health Behavior/Discharge Planning: Goal: Ability to manage health-related needs will improve Outcome: Not Progressing   Problem: Nutrition: Goal: Dietary intake will improve Outcome: Not Progressing   Problem: Ischemic Stroke/TIA Tissue Perfusion: Goal: Complications of ischemic stroke/TIA will be minimized Outcome: Progressing   Problem: Self-Care: Goal: Ability to participate in self-care as condition permits will improve Outcome: Progressing

## 2023-06-07 NOTE — Progress Notes (Signed)
Patient blood pressure was 86/72. Hospitalist placed an order for albumin. Patient blood pressure rechecked, patient now has an IV in her upper arm on the left so we are having to take wrist blood pressures. Blood pressure with dinamap was 70/15, manual blood pressure was 100/80. Patient is in no distress, up talking asking what she is supposed to be doing. Patient other vitals are WNL. Just wanted to make you aware. Second bottle of albumin running. MD made aware of patient blood pressures. Will report to day shift nurse.
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
## 2023-06-07 NOTE — Plan of Care (Signed)
  Problem: Self-Concept: Goal: Ability to maintain and perform role responsibilities to the fullest extent possible will improve Outcome: Not Applicable   Problem: Pain Management: Goal: Pain level will decrease with appropriate interventions Outcome: Progressing   Problem: Nutrition: Goal: Adequate nutrition will be maintained Outcome: Progressing   Problem: Elimination: Goal: Will not experience complications related to bowel motility Outcome: Progressing Goal: Will not experience complications related to urinary retention Outcome: Not Applicable   Pt is dialysis pt and she is oliguria. Pt also has bilateral AKA.

## 2023-06-07 NOTE — Progress Notes (Signed)
PROGRESS NOTE    [name]  ZOX:[id number] DOB: Sep 11, 1980 DOA: 06/04/2023 PCP: Inc, [facility]     Brief Narrative:  42 year old with history of DM2, ESRD, bilateral AKA, blindness, PAD initially presented with hypotension from dialysis.  Had a recent prolonged stay in the hospital in September 2024 for small bowel ischemia complicated by SBO and had extended ileocecectomy and discharged to SNF.  Patient has had HD catheter in the right chest wall due to difficulty accessing her AV fistula.  Eventually at SNF found to be also hypotensive during her HD therefore admitted to the ICU and PCCM was initially consulted.  Eventually she was weaned off pressors on 10/16 and transferred to [facility]  During this time it has been very difficult to get in touch with patient's [facility] POA, Sister [name] to get any formal consents.  Eventually ethics was consulted and stated next point of contact should be her husband.  Assessment & Plan:   Principal Problem:   Septic shock (HCC)   Goals of care discussion  Difficulty getting touch with patient's healthcare power of attorney, [name] sister.  Ethics was consulted. Ok to contact "next of kin" who is her husband. Palliative is having on going discussions with him.   New onset congestive heart failure with reduced EF 25% -Echocardiogram 2023 had preserved EF, echo done on 05/29/2023 showed EF of 25%.  New change, seen by cardiology but due to multiple comorbidities, continue current management.  No further intervention planned.   End-stage renal disease, likely diabetic nephropathy HD per nephrology.  AVF versus use of HD catheter per nephrology Status post IR right arm fistulogram with balloon angioplasty   Septic Shock secondary to suspected wound/skin/soft tissue infection, POA Acute metabolic encephalopathy Patient has completed all antibiotic course at this point.  Currently on midodrine 10 mg 3 times daily. Albumin prn.    Type  2 diabetes Peripheral vascular disease Recent small bowel ischemia Bilateral AKA Sliding scale and Accu-Chek   Punctate acute infarct R frontal love, acute to subacute infarct right parieto occipital infarct: EEG negative, seen by neurology.  LDL 19, A1c 5.7.   DVT prophylaxis: heparin injection 5,000 Units Start: 06/12/2023 2200 Code Status:   Code Status: Full Code Family Communication: None present Continue hospital stay until HD access has been addressed.       Subjective: When I walked in the room patient's sister Babette Relic was present at bedside.  I updated about patient's medical condition.  [name] is aware, all the questions answered. [name] understands that patient has very poor quality of life and social support. Patient is not any acute medical complaints.   Examination:  General exam: Chronically ill Respiratory system: Some rhonchi at the bases Cardiovascular system: S1 & S2 heard, RRR. No JVD, murmurs, rubs, gallops or clicks. No pedal edema. Gastrointestinal system: Wound dressing noted Central nervous system: Alert and oriented. No focal neurological deficits. Extremities: Bilateral AKA Skin: No rashes, lesions or ulcers Psychiatry: Judgement and insight appear poor Right upper extremity fistula   Pressure Injury 06/07/2023 Buttocks Left Stage 2 -  Partial thickness loss of dermis presenting as a shallow open injury with a red, pink wound bed without slough. (Active)  05/14/2023 2128  Location: Buttocks  Location Orientation: Left  Staging: Stage 2 -  Partial thickness loss of dermis presenting as a shallow open injury with a red, pink wound bed without slough.  Wound Description (Comments):   Present on Admission: Yes  Pressure Injury 05/25/2023 Sacrum Stage 1 -  Intact skin with non-blanchable redness of a localized area usually over a bony prominence. Old, healed pressure injury (Active)  06/04/2023 2128  Location: Sacrum  Location Orientation:   Staging: Stage 1  -  Intact skin with non-blanchable redness of a localized area usually over a bony prominence.  Wound Description (Comments): Old, healed pressure injury  Present on Admission: Yes     Diet Orders (From admission, onward)     Start     Ordered   06/05/23 1144  Diet regular Room service appropriate? Yes; Fluid consistency: Thin  Diet effective now       Question Answer Comment  Room service appropriate? Yes   Fluid consistency: Thin      06/05/23 1143            Objective: Vitals:   06/07/23 0058 06/07/23 0356 06/07/23 0405 06/07/23 0650  BP: (!) 99/57 (!) 86/72  100/80  Pulse: 68 77    Resp: 20 20    Temp: 98.5 F (36.9 C) 98 F (36.7 C)    TempSrc: Oral Oral    SpO2:      Weight:   43.8 kg     Intake/Output Summary (Last 24 hours) at 06/07/2023 1242 Last data filed at 06/07/2023 1206 Gross per 24 hour  Intake 250 ml  Output --  Net 250 ml   Filed Weights   06/05/23 0500 06/06/23 0500 06/07/23 0405  Weight: 44.4 kg 43.1 kg 43.8 kg    Scheduled Meds:  aspirin EC  81 mg Oral Daily   atorvastatin  20 mg Oral Daily   Chlorhexidine Gluconate Cloth  6 each Topical Q0600   darbepoetin (ARANESP) injection - DIALYSIS  60 mcg Subcutaneous Q Fri-1800   dorzolamide-timolol  1 drop Left Eye BID   dronabinol  5 mg Oral BID AC   feeding supplement (NEPRO CARB STEADY)  237 mL Oral BID BM   heparin  5,000 Units Subcutaneous Q8H   LORazepam  1 mg Intravenous Once   midodrine  10 mg Oral TID WC   Continuous Infusions:  Nutritional status     Body mass index is 18.25 kg/m.  Data Reviewed:   CBC: Recent Labs  Lab 06/03/23 0955 06/06/23 0130 06/07/23 0911  WBC 12.1* 12.2* 11.0*  HGB 10.6* 10.4* 10.8*  HCT 33.7* 33.8* 35.0*  MCV 96.0 96.8 96.2  PLT 210 250 255   Basic Metabolic Panel: Recent Labs  Lab 06/03/23 0955 06/06/23 0130 06/07/23 0911  NA 136 138 139  K 4.1 4.3 4.6  CL 100 99 97*  CO2 23 24 23   GLUCOSE 112* 231* 135*  BUN 27* 33* 24*   CREATININE 4.50* 5.63* 4.73*  CALCIUM 9.5 9.1 10.1  MG  --  1.8 1.8   GFR: Estimated Creatinine Clearance: 10.8 mL/min (A) (by C-G formula based on SCr of 4.73 mg/dL (H)). Liver Function Tests: Recent Labs  Lab 06/03/23 0955 06/06/23 0130 06/07/23 0911  AST 14* 15 19  ALT 21 17 16   ALKPHOS 81 69 76  BILITOT 1.1 0.9 1.0  PROT 6.0* 6.1* 7.2  ALBUMIN 1.8* 1.8* 2.9*   No results for input(s): "LIPASE", "AMYLASE" in the last 168 hours. No results for input(s): "AMMONIA" in the last 168 hours. Coagulation Profile: No results for input(s): "INR", "PROTIME" in the last 168 hours. Cardiac Enzymes: No results for input(s): "CKTOTAL", "CKMB", "CKMBINDEX", "TROPONINI" in the last 168 hours. BNP (last 3 results) No results  for input(s): "PROBNP" in the last 8760 hours. HbA1C: No results for input(s): "HGBA1C" in the last 72 hours. CBG: Recent Labs  Lab 06/06/23 1235 06/06/23 1726 06/06/23 2148 06/07/23 0813 06/07/23 1153  GLUCAP 141* 133* 165* 131* 145*   Lipid Profile: No results for input(s): "CHOL", "HDL", "LDLCALC", "TRIG", "CHOLHDL", "LDLDIRECT" in the last 72 hours. Thyroid Function Tests: No results for input(s): "TSH", "T4TOTAL", "FREET4", "T3FREE", "THYROIDAB" in the last 72 hours. Anemia Panel: No results for input(s): "VITAMINB12", "FOLATE", "FERRITIN", "TIBC", "IRON", "RETICCTPCT" in the last 72 hours. Sepsis Labs: No results for input(s): "PROCALCITON", "LATICACIDVEN" in the last 168 hours.  No results found for this or any previous visit (from the past 240 hour(s)).       Radiology Studies: No results found.         LOS: 11 days   Time spent= 35 mins    Miguel Rota, MD [facility]  If 7PM-7AM, please contact night-coverage  06/07/2023, 12:42 PM

## 2023-06-07 NOTE — Progress Notes (Signed)
Patient called for HD treatment. Per bedside nurse, patient is refusing to come to HD. Patient BP is low, requiring Albumin for correction. Provider, [place] notified.

## 2023-06-08 DIAGNOSIS — R6521 Severe sepsis with septic shock: Secondary | ICD-10-CM | POA: Diagnosis not present

## 2023-06-08 DIAGNOSIS — A419 Sepsis, unspecified organism: Secondary | ICD-10-CM | POA: Diagnosis not present

## 2023-06-08 LAB — CBC
HCT: 37.3 % (ref 36.0–46.0)
Hemoglobin: 11.2 g/dL — ABNORMAL LOW (ref 12.0–15.0)
MCH: 28.5 pg (ref 26.0–34.0)
MCHC: 30 g/dL (ref 30.0–36.0)
MCV: 94.9 fL (ref 80.0–100.0)
Platelets: 249 10*3/uL (ref 150–400)
RBC: 3.93 MIL/uL (ref 3.87–5.11)
RDW: 22.7 % — ABNORMAL HIGH (ref 11.5–15.5)
WBC: 9 10*3/uL (ref 4.0–10.5)
nRBC: 0.3 % — ABNORMAL HIGH (ref 0.0–0.2)

## 2023-06-08 LAB — COMPREHENSIVE METABOLIC PANEL
ALT: 16 U/L (ref 0–44)
AST: 15 U/L (ref 15–41)
Albumin: 2.4 g/dL — ABNORMAL LOW (ref 3.5–5.0)
Alkaline Phosphatase: 84 U/L (ref 38–126)
Anion gap: 17 — ABNORMAL HIGH (ref 5–15)
BUN: 32 mg/dL — ABNORMAL HIGH (ref 6–20)
CO2: 25 mmol/L (ref 22–32)
Calcium: 10 mg/dL (ref 8.9–10.3)
Chloride: 96 mmol/L — ABNORMAL LOW (ref 98–111)
Creatinine, Ser: 5.9 mg/dL — ABNORMAL HIGH (ref 0.44–1.00)
GFR, Estimated: 9 mL/min — ABNORMAL LOW (ref >60)
Glucose, Bld: 281 mg/dL — ABNORMAL HIGH (ref 70–99)
Potassium: 4.7 mmol/L (ref 3.5–5.1)
Sodium: 138 mmol/L (ref 135–145)
Total Bilirubin: 0.9 mg/dL (ref 0.3–1.2)
Total Protein: 7 g/dL (ref 6.5–8.1)

## 2023-06-08 LAB — GLUCOSE, CAPILLARY
Glucose-Capillary: 271 mg/dL — ABNORMAL HIGH (ref 70–99)
Glucose-Capillary: 172 mg/dL — ABNORMAL HIGH (ref 70–99)
Glucose-Capillary: 202 mg/dL — ABNORMAL HIGH (ref 70–99)

## 2023-06-08 LAB — MAGNESIUM: Magnesium: 1.8 mg/dL (ref 1.7–2.4)

## 2023-06-08 NOTE — Progress Notes (Addendum)
[facility] Progress Note   Subjective:   Seen in room - refused HD again this AM. Informed that next available time for HD would be Monday - she agrees. Denies CP/dyspnea. Tells me "I'm cold, leave me alone."  Objective Vitals:   06/07/23 2150 06/08/23 0326 06/08/23 0327 06/08/23 0743  BP: (!) 106/55  (!) 98/55 95/68  Pulse: 68  72 79  Resp:   18 16  Temp: 98.4 F (36.9 C)  98.4 F (36.9 C) 98.6 F (37 C)  TempSrc: Oral  Oral Oral  SpO2: 97%  97%   Weight:  42.9 kg     Physical Exam General: Chronically ill appearing woman, NAD. Room air. Heart: RRR; no murmur Lungs: CTA anteriorly Abdomen: dressings/wound vac in place Extremities: B AKA; 1+ RUE edema Dialysis Access: RUE AVF + bruit    Additional Objective Labs: Basic Metabolic Panel: Recent Labs  Lab 06/06/23 0130 06/07/23 0904 06/07/23 0911 06/08/23 0411  NA 138  --  139 138  K 4.3  --  4.6 4.7  CL 99  --  97* 96*  CO2 24  --  23 25  GLUCOSE 231*  --  135* 281*  BUN 33*  --  24* 32*  CREATININE 5.63*  --  4.73* 5.90*  CALCIUM 9.1  --  10.1 10.0  PHOS  --  5.3*  --   --    Liver Function Tests: Recent Labs  Lab 06/06/23 0130 06/07/23 0911 06/08/23 0411  AST 15 19 15   ALT 17 16 16   ALKPHOS 69 76 84  BILITOT 0.9 1.0 0.9  PROT 6.1* 7.2 7.0  ALBUMIN 1.8* 2.9* 2.4*   No results for input(s): "LIPASE", "AMYLASE" in the last 168 hours. CBC: Recent Labs  Lab 06/03/23 0955 06/06/23 0130 06/07/23 0911 06/08/23 0411  WBC 12.1* 12.2* 11.0* 9.0  HGB 10.6* 10.4* 10.8* 11.2*  HCT 33.7* 33.8* 35.0* 37.3  MCV 96.0 96.8 96.2 94.9  PLT 210 250 255 249   Medications:   aspirin EC  81 mg Oral Daily   atorvastatin  20 mg Oral Daily   Chlorhexidine Gluconate Cloth  6 each Topical Q0600   darbepoetin (ARANESP) injection - DIALYSIS  60 mcg Subcutaneous Q Fri-1800   dorzolamide-timolol  1 drop Left Eye BID   dronabinol  5 mg Oral BID AC   feeding supplement (NEPRO CARB STEADY)  237 mL Oral  BID BM   heparin  5,000 Units Subcutaneous Q8H   LORazepam  1 mg Intravenous Once   midodrine  10 mg Oral TID WC    Dialysis Orders: [facility] MWF  400/1.5 auto, 2K/2Ca, EDW 49kg, mircera 75 q2wks    Problem/Plan: 1. Shock: presumed septic, now off pressors, on midodrine. Suspected related to abd wound.  S/p abx, per primary 2. 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 Necrotic tissue noted in wound and around ileostomy.  WC and surgery following.   3. 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  CTA completed. EEG with no seizure like activity. Per Neuro.  4. ESRD - MWF schedule dialysis on schedule. S/p fgram 10/23 with IR: multiple areas of narrowing, s/p balloon angioplasty. Refused HD 10/25, refused again 10/26. Next HD Monday 10/28. 5. HTN/volume - Hypotensive, on midodrine. Volume status improved.  UF as tolerated. 6. Anemia of CKD- Hgb 10.8 - continue Aranesp q Fri 7. Secondary hyperparathyroidism - CorrCa high. Binders on hold. Senispar on hold d/t GI issues. Not on VDRA.  Repeat  PTH and Phos. 8. Nutrition - Alb 2.9, continue supps.    Ozzie Hoyle, PA-C 06/08/2023, 11:12 AM  [facility]   I have personally seen and examined this patient and agree with the assessment/plan as outlined below by [facility]. Patient is again refusing HD. Has not had HD since 10/23. Repeatedly yelling 'leave me alone'. Gave her the opportunity to tell me what the potential risks of her decision is but unable to tell me therefore reviewed risks. She says she'll do HD on Monday. K and volume status stable this am. Discussed with primary service. Recommend checking labs again tomorrow.  Yehia Mcbain,MD 06/08/2023 11:17 AM

## 2023-06-08 NOTE — Plan of Care (Signed)
  Problem: Education: Goal: Knowledge of the prescribed therapeutic regimen will improve Outcome: Not Progressing   Problem: Self-Care: Goal: Ability to meet self-care needs will improve Outcome: Not Progressing   Problem: Pain Management: Goal: Pain level will decrease with appropriate interventions Outcome: Not Progressing   Problem: Nutrition: Goal: Adequate nutrition will be maintained Outcome: Not Progressing

## 2023-06-08 NOTE — Progress Notes (Signed)
Patient refused dressing change

## 2023-06-08 NOTE — Final Progress Note (Addendum)
Nurse on 6N, Filbert Schilder, RN informed me that patient is refusing dialysis today.    Stacie Glaze, LPN-KDU

## 2023-06-08 NOTE — Progress Notes (Signed)
PROGRESS NOTE    [name]  ION:[id number] DOB: [birth year] DOA: 06/05/2023 PCP: Inc, [facility]     Brief Narrative:  42 year old with history of DM2, ESRD, bilateral AKA, blindness, PAD initially presented with hypotension from dialysis.  Had a recent prolonged stay in the hospital in September 2024 for small bowel ischemia complicated by SBO and had extended ileocecectomy and discharged to SNF.  Patient has had HD catheter in the right chest wall due to difficulty accessing her AV fistula.  Eventually at SNF found to be also hypotensive during her HD therefore admitted to the ICU and PCCM was initially consulted.  Eventually she was weaned off pressors on 10/16 and transferred to [facility].  During this time it has been very difficult to get in touch with patient's [facility] POA, Sister [name] to get any formal consents.  Eventually ethics was consulted and stated next point of contact should be her husband.  Assessment & Plan:   Principal Problem:   Septic shock (HCC)   Goals of care discussion  Difficulty getting touch with patient's healthcare power of attorney, [name] sister.  Ethics was consulted. Ok to contact "next of kin" who is her husband. Palliative is having on going discussions with him.   New onset congestive heart failure with reduced EF 25% -Echocardiogram 2023 had preserved EF, echo done on 05/29/2023 showed EF of 25%.  New change, seen by cardiology but due to multiple comorbidities, continue current management.  No further intervention planned.   End-stage renal disease, likely diabetic nephropathy HD per nephrology.  AVF versus use of HD catheter per nephrology Status post IR right arm fistulogram with balloon angioplasty Refusing HD   Septic Shock secondary to suspected wound/skin/soft tissue infection, POA Acute metabolic encephalopathy Patient has completed all antibiotic course at this point.  Currently on midodrine 10 mg 3 times daily. Albumin  prn.    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  LDL 19, A1c 5.7.   DVT prophylaxis: heparin injection 5,000 Units Start: 06/13/2023 2200 Code Status:   Code Status: Full Code Family Communication: Family present at bedside Continue hospital stay until HD access has been addressed.     Subjective: Asked me to "leave the room this morning and wants to be left on" She is refusing dialysis despite multiple providers and nursing staff speaking with her.   Examination:   General exam: Chronically ill Respiratory system: Some rhonchi at the bases Cardiovascular system: S1 & S2 heard, RRR. No JVD, murmurs, rubs, gallops or clicks. No pedal edema. Gastrointestinal system: Wound dressing noted Central nervous system: Alert and oriented. No focal neurological deficits. Extremities: Bilateral AKA Skin: No rashes, lesions or ulcers Psychiatry: Judgement and insight appear poor Right upper extremity fistula              Pressure Injury 05/18/2023 Buttocks Left Stage 2 -  Partial thickness loss of dermis presenting as a shallow open injury with a red, pink wound bed without slough. (Active)  05/26/2023 2128  Location: Buttocks  Location Orientation: Left  Staging: Stage 2 -  Partial thickness loss of dermis presenting as a shallow open injury with a red, pink wound bed without slough.  Wound Description (Comments):   Present on Admission: Yes     Pressure Injury 06/02/2023 Sacrum Stage 1 -  Intact skin with non-blanchable redness of a localized area usually over a bony prominence. Old, healed pressure injury (Active)  05/15/2023 2128  Location: Sacrum  Location Orientation:   Staging: Stage 1 -  Intact skin with non-blanchable redness of a localized area usually over a bony  prominence.  Wound Description (Comments): Old, healed pressure injury  Present on Admission: Yes     Diet Orders (From admission, onward)     Start     Ordered   06/05/23 1144  Diet regular Room service appropriate? Yes; Fluid consistency: Thin  Diet effective now       Question Answer Comment  Room service appropriate? Yes   Fluid consistency: Thin      06/05/23 1143            Objective: Vitals:   06/07/23 2150 06/08/23 0326 06/08/23 0327 06/08/23 0743  BP: (!) 106/55  (!) 98/55 95/68  Pulse: 68  72 79  Resp:   18 16  Temp: 98.4 F (36.9 C)  98.4 F (36.9 C) 98.6 F (37 C)  TempSrc: Oral  Oral Oral  SpO2: 97%  97%   Weight:  42.9 kg      Intake/Output Summary (Last 24 hours) at 06/08/2023 1147 Last data filed at 06/07/2023 1206 Gross per 24 hour  Intake 240 ml  Output --  Net 240 ml   Filed Weights   06/06/23 0500 06/07/23 0405 06/08/23 0326  Weight: 43.1 kg 43.8 kg 42.9 kg    Scheduled Meds:  aspirin EC  81 mg Oral Daily   atorvastatin  20 mg Oral Daily   Chlorhexidine Gluconate Cloth  6 each Topical Q0600   darbepoetin (ARANESP) injection - DIALYSIS  60 mcg Subcutaneous Q Fri-1800   dorzolamide-timolol  1 drop Left Eye BID   dronabinol  5 mg Oral BID AC   feeding supplement (NEPRO CARB STEADY)  237 mL Oral BID BM   heparin  5,000 Units Subcutaneous Q8H   LORazepam  1 mg Intravenous Once   midodrine  10 mg Oral TID WC   Continuous Infusions:  Nutritional status     Body mass index is 17.87 kg/m.  Data Reviewed:   CBC: Recent Labs  Lab 06/03/23 0955 06/06/23 0130 06/07/23 0911 06/08/23 0411  WBC 12.1* 12.2* 11.0* 9.0  HGB 10.6* 10.4* 10.8* 11.2*  HCT 33.7* 33.8* 35.0* 37.3  MCV 96.0 96.8 96.2 94.9  PLT 210 250 255 249   Basic Metabolic Panel: Recent Labs  Lab 06/03/23 0955 06/06/23 0130 06/07/23 0904 06/07/23 0911 06/08/23 0411  NA 136 138  --  139 138  K 4.1 4.3  --  4.6 4.7  CL 100 99  --  97* 96*  CO2 23 24  --  23  25  GLUCOSE 112* 231*  --  135* 281*  BUN 27* 33*  --  24* 32*  CREATININE 4.50* 5.63*  --  4.73* 5.90*  CALCIUM 9.5 9.1  --  10.1 10.0  MG  --  1.8  --  1.8 1.8  PHOS  --   --  5.3*  --   --    GFR: Estimated Creatinine Clearance: 8.5 mL/min (A) (by C-G formula based on SCr of 5.9 mg/dL (H)). Liver Function Tests: Recent Labs  Lab 06/03/23 0955 06/06/23 0130 06/07/23 0911 06/08/23 0411  AST 14* 15 19 15   ALT 21 17 16 16   ALKPHOS 81 69 76 84  BILITOT 1.1 0.9 1.0 0.9  PROT 6.0* 6.1*  7.2 7.0  ALBUMIN 1.8* 1.8* 2.9* 2.4*   No results for input(s): "LIPASE", "AMYLASE" in the last 168 hours. No results for input(s): "AMMONIA" in the last 168 hours. Coagulation Profile: No results for input(s): "INR", "PROTIME" in the last 168 hours. Cardiac Enzymes: No results for input(s): "CKTOTAL", "CKMB", "CKMBINDEX", "TROPONINI" in the last 168 hours. BNP (last 3 results) No results for input(s): "PROBNP" in the last 8760 hours. HbA1C: No results for input(s): "HGBA1C" in the last 72 hours. CBG: Recent Labs  Lab 06/06/23 2148 06/07/23 0813 06/07/23 1153 06/07/23 1655 06/08/23 0335  GLUCAP 165* 131* 145* 226* 271*   Lipid Profile: No results for input(s): "CHOL", "HDL", "LDLCALC", "TRIG", "CHOLHDL", "LDLDIRECT" in the last 72 hours. Thyroid Function Tests: No results for input(s): "TSH", "T4TOTAL", "FREET4", "T3FREE", "THYROIDAB" in the last 72 hours. Anemia Panel: No results for input(s): "VITAMINB12", "FOLATE", "FERRITIN", "TIBC", "IRON", "RETICCTPCT" in the last 72 hours. Sepsis Labs: No results for input(s): "PROCALCITON", "LATICACIDVEN" in the last 168 hours.  No results found for this or any previous visit (from the past 240 hour(s)).       Radiology Studies: No results found.         LOS: 12 days   Time spent= 35 mins    Miguel Rota, MD [facility]  If 7PM-7AM, please contact night-coverage  06/08/2023, 11:47 AM

## 2023-06-09 DIAGNOSIS — R6521 Severe sepsis with septic shock: Secondary | ICD-10-CM | POA: Diagnosis not present

## 2023-06-09 DIAGNOSIS — A419 Sepsis, unspecified organism: Secondary | ICD-10-CM | POA: Diagnosis not present

## 2023-06-09 LAB — CBC
HCT: 37.9 % (ref 36.0–46.0)
Hemoglobin: 11.7 g/dL — ABNORMAL LOW (ref 12.0–15.0)
MCH: 29 pg (ref 26.0–34.0)
MCHC: 30.9 g/dL (ref 30.0–36.0)
MCV: 94 fL (ref 80.0–100.0)
Platelets: 240 10*3/uL (ref 150–400)
RBC: 4.03 MIL/uL (ref 3.87–5.11)
RDW: 22.1 % — ABNORMAL HIGH (ref 11.5–15.5)
WBC: 9.7 10*3/uL (ref 4.0–10.5)
nRBC: 0.2 % (ref 0.0–0.2)

## 2023-06-09 LAB — COMPREHENSIVE METABOLIC PANEL
ALT: 14 U/L (ref 0–44)
AST: 16 U/L (ref 15–41)
Albumin: 2.2 g/dL — ABNORMAL LOW (ref 3.5–5.0)
Alkaline Phosphatase: 86 U/L (ref 38–126)
Anion gap: 15 (ref 5–15)
BUN: 39 mg/dL — ABNORMAL HIGH (ref 6–20)
CO2: 25 mmol/L (ref 22–32)
Calcium: 9.6 mg/dL (ref 8.9–10.3)
Chloride: 99 mmol/L (ref 98–111)
Creatinine, Ser: 7.18 mg/dL — ABNORMAL HIGH (ref 0.44–1.00)
GFR, Estimated: 7 mL/min — ABNORMAL LOW (ref >60)
Glucose, Bld: 156 mg/dL — ABNORMAL HIGH (ref 70–99)
Potassium: 5.1 mmol/L (ref 3.5–5.1)
Sodium: 139 mmol/L (ref 135–145)
Total Bilirubin: 0.6 mg/dL (ref 0.3–1.2)
Total Protein: 7 g/dL (ref 6.5–8.1)

## 2023-06-09 LAB — GLUCOSE, CAPILLARY
Glucose-Capillary: 125 mg/dL — ABNORMAL HIGH (ref 70–99)
Glucose-Capillary: 139 mg/dL — ABNORMAL HIGH (ref 70–99)
Glucose-Capillary: 158 mg/dL — ABNORMAL HIGH (ref 70–99)
Glucose-Capillary: 227 mg/dL — ABNORMAL HIGH (ref 70–99)

## 2023-06-09 LAB — MAGNESIUM: Magnesium: 1.7 mg/dL (ref 1.7–2.4)

## 2023-06-09 NOTE — Progress Notes (Signed)
PROGRESS NOTE    [name]  AYT:[id number] DOB: April 13, 1981 DOA: 05/25/2023 PCP: [facility]     Brief Narrative:  42 year old with history of DM2, ESRD, bilateral AKA, blindness, PAD initially presented with hypotension from dialysis.  Had a recent prolonged stay in the hospital in September 2024 for small bowel ischemia complicated by SBO and had extended ileocecectomy and discharged to SNF.  Patient has had HD catheter in the right chest wall due to difficulty accessing her AV fistula.  Eventually at SNF found to be also hypotensive during her HD therefore admitted to the ICU and PCCM was initially consulted.  Eventually she was weaned off pressors on 10/16 and transferred to [facility].  During this time it has been very difficult to get in touch with patient's [facility] POA, Sister [name] to get any formal consents.  Eventually ethics was consulted and stated next point of contact should be her husband.  Assessment & Plan:   Principal Problem:   Septic shock (HCC)   Goals of care discussion  Difficulty getting touch with patient's healthcare power of attorney, [name] sister.  Ethics was consulted. Ok to contact "next of kin" who is her husband. Palliative is having on going discussions with him.   New onset congestive heart failure with reduced EF 25% -Echocardiogram 2023 had preserved EF, echo done on 05/29/2023 showed EF of 25%.  New change, seen by cardiology but due to multiple comorbidities, continue current management.  No further intervention planned.   End-stage renal disease, likely diabetic nephropathy HD per nephrology.  AVF versus use of HD catheter per nephrology Status post IR right arm fistulogram with balloon angioplasty Refusing HD   Septic Shock secondary to suspected wound/skin/soft tissue infection, POA Acute metabolic encephalopathy Patient has completed all antibiotic course at this point.  Currently on midodrine 10 mg 3 times daily. Albumin  prn.    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  LDL 19, A1c 5.7.   DVT prophylaxis: heparin injection 5,000 Units Start: 06/12/2023 2200 Code Status:   Code Status: Full Code Family Communication: Family present at bedside Continue hospital stay until HD access has been addressed.     Subjective: Seen at bedside.  When asked she is agreeable to hemodialysis tomorrow.   Examination:   General exam: Chronically ill Respiratory system: Some rhonchi at the bases Cardiovascular system: S1 & S2 heard, RRR. No JVD, murmurs, rubs, gallops or clicks. No pedal edema. Gastrointestinal system: Wound dressing noted Central nervous system: Alert and oriented. No focal neurological deficits. Extremities: Bilateral AKA Skin: No rashes, lesions or ulcers Psychiatry: Judgement and insight appear poor Right upper extremity fistula              Pressure Injury 05/22/2023 Buttocks Left Stage 2 -  Partial thickness loss of dermis presenting as a shallow open injury with a red, pink wound bed without slough. (Active)  06/02/2023 2128  Location: Buttocks  Location Orientation: Left  Staging: Stage 2 -  Partial thickness loss of dermis presenting as a shallow open injury with a red, pink wound bed without slough.  Wound Description (Comments):   Present on Admission: Yes     Pressure Injury 05/21/2023 Sacrum Stage 1 -  Intact skin with non-blanchable redness of a localized area usually over a bony prominence.  Old, healed pressure injury (Active)  05/24/2023 2128  Location: Sacrum  Location Orientation:   Staging: Stage 1 -  Intact skin with non-blanchable redness of a localized area usually over a bony prominence.  Wound Description (Comments): Old, healed pressure injury  Present on  Admission: Yes     Diet Orders (From admission, onward)     Start     Ordered   06/05/23 1144  Diet regular Room service appropriate? Yes; Fluid consistency: Thin  Diet effective now       Question Answer Comment  Room service appropriate? Yes   Fluid consistency: Thin      06/05/23 1143            Objective: Vitals:   06/09/23 0546 06/09/23 0551 06/09/23 0556 06/09/23 0836  BP: (!) 46/30  99/61 (!) 104/56  Pulse: 66  68 (!) 50  Resp: 20   18  Temp: 98.6 F (37 C)   98 F (36.7 C)  TempSrc: Oral   Oral  SpO2: 100%   100%  Weight:  42.7 kg      Intake/Output Summary (Last 24 hours) at 06/09/2023 1141 Last data filed at 06/09/2023 0400 Gross per 24 hour  Intake --  Output 300 ml  Net -300 ml   Filed Weights   06/07/23 0405 06/08/23 0326 06/09/23 0551  Weight: 43.8 kg 42.9 kg 42.7 kg    Scheduled Meds:  aspirin EC  81 mg Oral Daily   atorvastatin  20 mg Oral Daily   Chlorhexidine Gluconate Cloth  6 each Topical Q0600   darbepoetin (ARANESP) injection - DIALYSIS  60 mcg Subcutaneous Q Fri-1800   dorzolamide-timolol  1 drop Left Eye BID   dronabinol  5 mg Oral BID AC   feeding supplement (NEPRO CARB STEADY)  237 mL Oral BID BM   heparin  5,000 Units Subcutaneous Q8H   LORazepam  1 mg Intravenous Once   midodrine  10 mg Oral TID WC   Continuous Infusions:  Nutritional status     Body mass index is 17.79 kg/m.  Data Reviewed:   CBC: Recent Labs  Lab 06/03/23 0955 06/06/23 0130 06/07/23 0911 06/08/23 0411 06/09/23 0955  WBC 12.1* 12.2* 11.0* 9.0 9.7  HGB 10.6* 10.4* 10.8* 11.2* 11.7*  HCT 33.7* 33.8* 35.0* 37.3 37.9  MCV 96.0 96.8 96.2 94.9 94.0  PLT 210 250 255 249 240   Basic Metabolic Panel: Recent Labs  Lab 06/03/23 0955 06/06/23 0130 06/07/23 0904 06/07/23 0911 06/08/23 0411 06/09/23 0955  NA 136 138  --  139 138 139  K 4.1 4.3  --  4.6 4.7 5.1  CL 100 99  --  97* 96* 99  CO2 23 24  --  23 25 25   GLUCOSE 112* 231*  --  135*  281* 156*  BUN 27* 33*  --  24* 32* 39*  CREATININE 4.50* 5.63*  --  4.73* 5.90* 7.18*  CALCIUM 9.5 9.1  --  10.1 10.0 9.6  MG  --  1.8  --  1.8 1.8 1.7  PHOS  --   --  5.3*  --   --   --    GFR: Estimated Creatinine Clearance: 7 mL/min (A) (by C-G formula based on SCr of 7.18 mg/dL (H)). Liver Function Tests: Recent Labs  Lab 06/03/23 0955 06/06/23 0130 06/07/23 0911 06/08/23 0411 06/09/23 0955  AST 14* 15 19 15 16   ALT 21 17 16 16 14   ALKPHOS 81 69 76 84 86  BILITOT 1.1 0.9 1.0 0.9 0.6  PROT 6.0* 6.1* 7.2 7.0 7.0  ALBUMIN 1.8* 1.8* 2.9* 2.4* 2.2*   No results for input(s): "LIPASE", "AMYLASE" in the last 168 hours. No results for input(s): "AMMONIA" in the last 168 hours. Coagulation Profile: No results for input(s): "INR", "PROTIME" in the last 168 hours. Cardiac Enzymes: No results for input(s): "CKTOTAL", "CKMB", "CKMBINDEX", "TROPONINI" in the last 168 hours. BNP (last 3 results) No results for input(s): "PROBNP" in the last 8760 hours. HbA1C: No results for input(s): "HGBA1C" in the last 72 hours. CBG: Recent Labs  Lab 06/08/23 0335 06/08/23 1209 06/08/23 1730 06/09/23 0014 06/09/23 0822  GLUCAP 271* 172* 202* 227* 158*   Lipid Profile: No results for input(s): "CHOL", "HDL", "LDLCALC", "TRIG", "CHOLHDL", "LDLDIRECT" in the last 72 hours. Thyroid Function Tests: No results for input(s): "TSH", "T4TOTAL", "FREET4", "T3FREE", "THYROIDAB" in the last 72 hours. Anemia Panel: No results for input(s): "VITAMINB12", "FOLATE", "FERRITIN", "TIBC", "IRON", "RETICCTPCT" in the last 72 hours. Sepsis Labs: No results for input(s): "PROCALCITON", "LATICACIDVEN" in the last 168 hours.  No results found for this or any previous visit (from the past 240 hour(s)).       Radiology Studies: No results found.         LOS: 13 days   Time spent= 35 mins    Miguel Rota, MD [facility]  If 7PM-7AM, please contact night-coverage  06/09/2023, 11:41  AM

## 2023-06-09 NOTE — Plan of Care (Signed)
  Problem: Education: Goal: Knowledge of the prescribed therapeutic regimen will improve Outcome: Progressing Goal: Ability to verbalize activity precautions or restrictions will improve Outcome: Progressing Goal: Understanding of discharge needs will improve Outcome: Progressing   Problem: Clinical Measurements: Goal: Postoperative complications will be avoided or minimized Outcome: Progressing   Problem: Self-Care: Goal: Ability to meet self-care needs will improve Outcome: Progressing   Problem: Pain Management: Goal: Pain level will decrease with appropriate interventions Outcome: Progressing   Problem: Skin Integrity: Goal: Demonstration of wound healing without infection will improve Outcome: Progressing   Problem: Activity: Goal: Risk for activity intolerance will decrease Outcome: Progressing   Problem: Nutrition: Goal: Adequate nutrition will be maintained Outcome: Progressing   Problem: Coping: Goal: Level of anxiety will decrease Outcome: Progressing   Problem: Elimination: Goal: Will not experience complications related to bowel motility Outcome: Progressing   Problem: Pain Managment: Goal: General experience of comfort will improve Outcome: Progressing   Problem: Safety: Goal: Ability to remain free from injury will improve Outcome: Progressing   Problem: Skin Integrity: Goal: Risk for impaired skin integrity will decrease Outcome: Progressing   Problem: Education: Goal: Ability to describe self-care measures that may prevent or decrease complications (Diabetes Survival Skills Education) will improve Outcome: Progressing Goal: Individualized Educational Video(s) Outcome: Progressing   Problem: Coping: Goal: Ability to adjust to condition or change in health will improve Outcome: Progressing   Problem: Fluid Volume: Goal: Ability to maintain a balanced intake and output will improve Outcome: Progressing   Problem: Health Behavior/Discharge  Planning: Goal: Ability to identify and utilize available resources and services will improve Outcome: Progressing Goal: Ability to manage health-related needs will improve Outcome: Progressing   Problem: Metabolic: Goal: Ability to maintain appropriate glucose levels will improve Outcome: Progressing   Problem: Nutritional: Goal: Maintenance of adequate nutrition will improve Outcome: Progressing Goal: Progress toward achieving an optimal weight will improve Outcome: Progressing   Problem: Skin Integrity: Goal: Risk for impaired skin integrity will decrease Outcome: Progressing   Problem: Tissue Perfusion: Goal: Adequacy of tissue perfusion will improve Outcome: Progressing   Problem: Education: Goal: Knowledge of disease or condition will improve Outcome: Progressing Goal: Knowledge of secondary prevention will improve (MUST DOCUMENT ALL) Outcome: Progressing Goal: Knowledge of patient specific risk factors will improve [name] N/A or DELETE if not current risk factor) Outcome: Progressing   Problem: Ischemic Stroke/TIA Tissue Perfusion: Goal: Complications of ischemic stroke/TIA will be minimized Outcome: Progressing   Problem: Coping: Goal: Will verbalize positive feelings about self Outcome: Progressing Goal: Will identify appropriate support needs Outcome: Progressing   Problem: Health Behavior/Discharge Planning: Goal: Ability to manage health-related needs will improve Outcome: Progressing Goal: Goals will be collaboratively established with patient/family Outcome: Progressing   Problem: Self-Care: Goal: Ability to participate in self-care as condition permits will improve Outcome: Progressing Goal: Verbalization of feelings and concerns over difficulty with self-care will improve Outcome: Progressing Goal: Ability to communicate needs accurately will improve Outcome: Progressing   Problem: Nutrition: Goal: Risk of aspiration will decrease Outcome:  Progressing Goal: Dietary intake will improve Outcome: Progressing

## 2023-06-09 NOTE — Plan of Care (Signed)
Problem: Education: Goal: Knowledge of the prescribed therapeutic regimen will improve 06/09/2023 0441 by Della Goo, RN Outcome: Progressing 06/09/2023 0400 by Della Goo, RN Outcome: Progressing Goal: Ability to verbalize activity precautions or restrictions will improve 06/09/2023 0441 by Della Goo, RN Outcome: Progressing 06/09/2023 0400 by Della Goo, RN Outcome: Progressing Goal: Understanding of discharge needs will improve 06/09/2023 0441 by Della Goo, RN Outcome: Progressing 06/09/2023 0400 by Della Goo, RN Outcome: Progressing   Problem: Clinical Measurements: Goal: Postoperative complications will be avoided or minimized 06/09/2023 0441 by Della Goo, RN Outcome: Progressing 06/09/2023 0400 by Della Goo, RN Outcome: Progressing   Problem: Self-Care: Goal: Ability to meet self-care needs will improve 06/09/2023 0441 by Della Goo, RN Outcome: Progressing 06/09/2023 0400 by Della Goo, RN Outcome: Progressing   Problem: Pain Management: Goal: Pain level will decrease with appropriate interventions 06/09/2023 0441 by Della Goo, RN Outcome: Progressing 06/09/2023 0400 by Della Goo, RN Outcome: Progressing   Problem: Skin Integrity: Goal: Demonstration of wound healing without infection will improve 06/09/2023 0441 by Della Goo, RN Outcome: Progressing 06/09/2023 0400 by Della Goo, RN Outcome: Progressing   Problem: Activity: Goal: Risk for activity intolerance will decrease 06/09/2023 0441 by Della Goo, RN Outcome: Progressing 06/09/2023 0400 by Della Goo, RN Outcome: Progressing   Problem: Nutrition: Goal: Adequate nutrition will be maintained 06/09/2023 0441 by Della Goo, RN Outcome: Progressing 06/09/2023 0400 by Della Goo, RN Outcome: Progressing   Problem: Coping: Goal: Level of anxiety will  decrease 06/09/2023 0441 by Della Goo, RN Outcome: Progressing 06/09/2023 0400 by Della Goo, RN Outcome: Progressing   Problem: Elimination: Goal: Will not experience complications related to bowel motility 06/09/2023 0441 by Della Goo, RN Outcome: Progressing 06/09/2023 0400 by Della Goo, RN Outcome: Progressing   Problem: Pain Managment: Goal: General experience of comfort will improve 06/09/2023 0441 by Della Goo, RN Outcome: Progressing 06/09/2023 0400 by Della Goo, RN Outcome: Progressing   Problem: Safety: Goal: Ability to remain free from injury will improve 06/09/2023 0441 by Della Goo, RN Outcome: Progressing 06/09/2023 0400 by Della Goo, RN Outcome: Progressing   Problem: Skin Integrity: Goal: Risk for impaired skin integrity will decrease 06/09/2023 0441 by Della Goo, RN Outcome: Progressing 06/09/2023 0400 by Della Goo, RN Outcome: Progressing   Problem: Education: Goal: Ability to describe self-care measures that may prevent or decrease complications (Diabetes Survival Skills Education) will improve 06/09/2023 0441 by Della Goo, RN Outcome: Progressing 06/09/2023 0400 by Della Goo, RN Outcome: Progressing Goal: Individualized Educational Video(s) 06/09/2023 0441 by Della Goo, RN Outcome: Progressing 06/09/2023 0400 by Della Goo, RN Outcome: Progressing   Problem: Coping: Goal: Ability to adjust to condition or change in health will improve 06/09/2023 0441 by Della Goo, RN Outcome: Progressing 06/09/2023 0400 by Della Goo, RN Outcome: Progressing   Problem: Fluid Volume: Goal: Ability to maintain a balanced intake and output will improve 06/09/2023 0441 by Della Goo, RN Outcome: Progressing 06/09/2023 0400 by Della Goo, RN Outcome: Progressing   Problem: Health Behavior/Discharge Planning: Goal: Ability  to identify and utilize available resources and services will improve 06/09/2023 0441 by Della Goo, RN Outcome: Progressing 06/09/2023 0400 by Della Goo, RN Outcome: Progressing Goal: Ability to manage health-related needs will improve 06/09/2023 0441 by Della Goo, RN Outcome: Progressing 06/09/2023  0400 by Della Goo, RN Outcome: Progressing   Problem: Metabolic: Goal: Ability to maintain appropriate glucose levels will improve 06/09/2023 0441 by Della Goo, RN Outcome: Progressing 06/09/2023 0400 by Della Goo, RN Outcome: Progressing   Problem: Nutritional: Goal: Maintenance of adequate nutrition will improve 06/09/2023 0441 by Della Goo, RN Outcome: Progressing 06/09/2023 0400 by Della Goo, RN Outcome: Progressing Goal: Progress toward achieving an optimal weight will improve 06/09/2023 0441 by Della Goo, RN Outcome: Progressing 06/09/2023 0400 by Della Goo, RN Outcome: Progressing   Problem: Skin Integrity: Goal: Risk for impaired skin integrity will decrease 06/09/2023 0441 by Della Goo, RN Outcome: Progressing 06/09/2023 0400 by Della Goo, RN Outcome: Progressing   Problem: Tissue Perfusion: Goal: Adequacy of tissue perfusion will improve 06/09/2023 0441 by Della Goo, RN Outcome: Progressing 06/09/2023 0400 by Della Goo, RN Outcome: Progressing   Problem: Education: Goal: Knowledge of disease or condition will improve 06/09/2023 0441 by Della Goo, RN Outcome: Progressing 06/09/2023 0400 by Della Goo, RN Outcome: Progressing Goal: Knowledge of secondary prevention will improve (MUST DOCUMENT ALL) 06/09/2023 0441 by Della Goo, RN Outcome: Progressing 06/09/2023 0400 by Della Goo, RN Outcome: Progressing Goal: Knowledge of patient specific risk factors will improve [name] N/A or DELETE if not current risk  factor) 06/09/2023 0441 by Della Goo, RN Outcome: Progressing 06/09/2023 0400 by Della Goo, RN Outcome: Progressing   Problem: Ischemic Stroke/TIA Tissue Perfusion: Goal: Complications of ischemic stroke/TIA will be minimized 06/09/2023 0441 by Della Goo, RN Outcome: Progressing 06/09/2023 0400 by Della Goo, RN Outcome: Progressing   Problem: Coping: Goal: Will verbalize positive feelings about self 06/09/2023 0441 by Della Goo, RN Outcome: Progressing 06/09/2023 0400 by Della Goo, RN Outcome: Progressing Goal: Will identify appropriate support needs 06/09/2023 0441 by Della Goo, RN Outcome: Progressing 06/09/2023 0400 by Della Goo, RN Outcome: Progressing   Problem: Health Behavior/Discharge Planning: Goal: Ability to manage health-related needs will improve 06/09/2023 0441 by Della Goo, RN Outcome: Progressing 06/09/2023 0400 by Della Goo, RN Outcome: Progressing Goal: Goals will be collaboratively established with patient/family 06/09/2023 0441 by Della Goo, RN Outcome: Progressing 06/09/2023 0400 by Della Goo, RN Outcome: Progressing   Problem: Self-Care: Goal: Ability to participate in self-care as condition permits will improve 06/09/2023 0441 by Della Goo, RN Outcome: Progressing 06/09/2023 0400 by Della Goo, RN Outcome: Progressing Goal: Verbalization of feelings and concerns over difficulty with self-care will improve 06/09/2023 0441 by Della Goo, RN Outcome: Progressing 06/09/2023 0400 by Della Goo, RN Outcome: Progressing Goal: Ability to communicate needs accurately will improve 06/09/2023 0441 by Della Goo, RN Outcome: Progressing 06/09/2023 0400 by Della Goo, RN Outcome: Progressing   Problem: Nutrition: Goal: Risk of aspiration will decrease 06/09/2023 0441 by Della Goo, RN Outcome:  Progressing 06/09/2023 0400 by Della Goo, RN Outcome: Progressing Goal: Dietary intake will improve 06/09/2023 0441 by Della Goo, RN Outcome: Progressing 06/09/2023 0400 by Della Goo, RN Outcome: Progressing

## 2023-06-09 NOTE — Progress Notes (Signed)
[facility] Progress Note   Subjective:  Seen in room - resting comfortable. ??Severe hypotension recorded this AM, better on last check. Agrees for HD tomorrow as discussed yesterday.   Objective Vitals:   06/09/23 0546 06/09/23 0551 06/09/23 0556 06/09/23 0836  BP: (!) 46/30  99/61 (!) 104/56  Pulse: 66  68 (!) 50  Resp: 20   18  Temp: 98.6 F (37 C)   98 F (36.7 C)  TempSrc: Oral   Oral  SpO2: 100%   100%  Weight:  42.7 kg     Physical Exam General: Chronically ill appearing woman, NAD. Room air. Heart: RRR; no murmur Lungs: CTA anteriorly Abdomen: dressings/wound vac in place Extremities: B AKA; no RUE edema Dialysis Access: RUE AVF + bruit   Additional Objective Labs: Basic Metabolic Panel: Recent Labs  Lab 06/06/23 0130 06/07/23 0904 06/07/23 0911 06/08/23 0411  NA 138  --  139 138  K 4.3  --  4.6 4.7  CL 99  --  97* 96*  CO2 24  --  23 25  GLUCOSE 231*  --  135* 281*  BUN 33*  --  24* 32*  CREATININE 5.63*  --  4.73* 5.90*  CALCIUM 9.1  --  10.1 10.0  PHOS  --  5.3*  --   --    Liver Function Tests: Recent Labs  Lab 06/06/23 0130 06/07/23 0911 06/08/23 0411  AST 15 19 15   ALT 17 16 16   ALKPHOS 69 76 84  BILITOT 0.9 1.0 0.9  PROT 6.1* 7.2 7.0  ALBUMIN 1.8* 2.9* 2.4*   CBC: Recent Labs  Lab 06/03/23 0955 06/06/23 0130 06/07/23 0911 06/08/23 0411  WBC 12.1* 12.2* 11.0* 9.0  HGB 10.6* 10.4* 10.8* 11.2*  HCT 33.7* 33.8* 35.0* 37.3  MCV 96.0 96.8 96.2 94.9  PLT 210 250 255 249   Medications:   aspirin EC  81 mg Oral Daily   atorvastatin  20 mg Oral Daily   Chlorhexidine Gluconate Cloth  6 each Topical Q0600   darbepoetin (ARANESP) injection - DIALYSIS  60 mcg Subcutaneous Q Fri-1800   dorzolamide-timolol  1 drop Left Eye BID   dronabinol  5 mg Oral BID AC   feeding supplement (NEPRO CARB STEADY)  237 mL Oral BID BM   heparin  5,000 Units Subcutaneous Q8H   LORazepam  1 mg Intravenous Once   midodrine  10 mg Oral TID  WC    Dialysis Orders: [facility] MWF  400/1.5 auto, 2K/2Ca, EDW 49kg, mircera 75 q2wks    Problem/Plan: 1. Shock: presumed septic, now off pressors, on midodrine. Suspected related to abd wound.  S/p abx, per primary 2. 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 Necrotic tissue noted in wound and around ileostomy.  WC and surgery following.   3. 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  CTA completed. EEG with no seizure like activity. Per Neuro.  4. ESRD - MWF schedule dialysis on schedule. S/p fgram 10/23 with IR: multiple areas of narrowing, s/p balloon angioplasty. Refused HD 10/25, 10/26. Next HD Monday 10/28. 5. HTN/volume - Hypotensive, on midodrine. Volume status improved - WELL below prior EDW. 6. Anemia of CKD- Hgb 11.2 - continue Aranesp q Fri 7. Secondary hyperparathyroidism - CorrCa high. Binders on hold. Senispar on hold d/t GI issues. Not on VDRA.  Repeat PTH and Phos. 8. Nutrition - Alb 2.4, continue supps.  Ozzie Hoyle, PA-C 06/09/2023, 9:15 AM  [employer]

## 2023-06-10 DIAGNOSIS — Z7189 Other specified counseling: Secondary | ICD-10-CM | POA: Diagnosis not present

## 2023-06-10 DIAGNOSIS — R6521 Severe sepsis with septic shock: Secondary | ICD-10-CM | POA: Diagnosis not present

## 2023-06-10 DIAGNOSIS — Z515 Encounter for palliative care: Secondary | ICD-10-CM | POA: Diagnosis not present

## 2023-06-10 DIAGNOSIS — A419 Sepsis, unspecified organism: Secondary | ICD-10-CM | POA: Diagnosis not present

## 2023-06-10 DIAGNOSIS — N186 End stage renal disease: Secondary | ICD-10-CM | POA: Diagnosis not present

## 2023-06-10 LAB — CBC
HCT: 37.3 % (ref 36.0–46.0)
Hemoglobin: 11.5 g/dL — ABNORMAL LOW (ref 12.0–15.0)
MCH: 29.4 pg (ref 26.0–34.0)
MCHC: 30.8 g/dL (ref 30.0–36.0)
MCV: 95.4 fL (ref 80.0–100.0)
Platelets: 272 10*3/uL (ref 150–400)
RBC: 3.91 MIL/uL (ref 3.87–5.11)
RDW: 21.7 % — ABNORMAL HIGH (ref 11.5–15.5)
WBC: 10.1 10*3/uL (ref 4.0–10.5)
nRBC: 0.3 % — ABNORMAL HIGH (ref 0.0–0.2)

## 2023-06-10 LAB — COMPREHENSIVE METABOLIC PANEL
ALT: 14 U/L (ref 0–44)
AST: 15 U/L (ref 15–41)
Albumin: 2.1 g/dL — ABNORMAL LOW (ref 3.5–5.0)
Alkaline Phosphatase: 83 U/L (ref 38–126)
Anion gap: 13 (ref 5–15)
BUN: 42 mg/dL — ABNORMAL HIGH (ref 6–20)
CO2: 26 mmol/L (ref 22–32)
Calcium: 9.3 mg/dL (ref 8.9–10.3)
Chloride: 99 mmol/L (ref 98–111)
Creatinine, Ser: 8.37 mg/dL — ABNORMAL HIGH (ref 0.44–1.00)
GFR, Estimated: 6 mL/min — ABNORMAL LOW (ref >60)
Glucose, Bld: 105 mg/dL — ABNORMAL HIGH (ref 70–99)
Potassium: 4.5 mmol/L (ref 3.5–5.1)
Sodium: 138 mmol/L (ref 135–145)
Total Bilirubin: 0.8 mg/dL (ref 0.3–1.2)
Total Protein: 6.8 g/dL (ref 6.5–8.1)

## 2023-06-10 LAB — GLUCOSE, CAPILLARY
Glucose-Capillary: 105 mg/dL — ABNORMAL HIGH (ref 70–99)
Glucose-Capillary: 115 mg/dL — ABNORMAL HIGH (ref 70–99)
Glucose-Capillary: 128 mg/dL — ABNORMAL HIGH (ref 70–99)

## 2023-06-10 LAB — PARATHYROID HORMONE, INTACT (NO CA): PTH: 72 pg/mL — ABNORMAL HIGH (ref 15–65)

## 2023-06-10 LAB — PHOSPHORUS: Phosphorus: 6.4 mg/dL — ABNORMAL HIGH (ref 2.5–4.6)

## 2023-06-10 LAB — MAGNESIUM: Magnesium: 1.8 mg/dL (ref 1.7–2.4)

## 2023-06-10 MED ORDER — MIDODRINE HCL 5 MG PO TABS
10.0000 mg | ORAL_TABLET | Freq: Once | ORAL | Status: AC
  Administered 2023-06-10: 10 mg via ORAL
  Filled 2023-06-10: qty 2

## 2023-06-10 MED ORDER — LIDOCAINE 5 % EX PTCH
1.0000 | MEDICATED_PATCH | CUTANEOUS | Status: DC
Start: 2023-06-10 — End: 2023-06-16
  Administered 2023-06-10 – 2023-06-14 (×5): 1 via TRANSDERMAL
  Filled 2023-06-10 (×6): qty 1

## 2023-06-10 NOTE — Progress Notes (Addendum)
Received patient in bed to unit.  Alert and oriented.  Informed consent signed and in chart.   TX duration:3.5  Patient tolerated well.  Transported back to the room  Alert, without acute distress.  Hand-off given to patient's nurse.   Access used: right AVF Access issues: high  AP/VP pressures during   Total UF removed: Medication(s) given: oxy, tylenol   06/10/23 1857  Vitals  Temp (!) 97.3 F (36.3 C)  Temp Source Oral  BP 126/68  MAP (mmHg) 79  BP Location Left Wrist  BP Method Automatic  Patient Position (if appropriate) Lying  Pulse Rate Source Monitor  ECG Heart Rate 68  Resp 15  Oxygen Therapy  SpO2 96 %  O2 Device Room Air  During Treatment Monitoring  Blood Flow Rate (mL/min) 299 mL/min  Arterial Pressure (mmHg) -9.09 mmHg  Venous Pressure (mmHg) 350.08 mmHg  TMP (mmHg) -0.4 mmHg  Ultrafiltration Rate (mL/min) 0 mL/min  Dialysate Flow Rate (mL/min) 300 ml/min  Dialysate Potassium Concentration 2  Dialysate Calcium Concentration 2.5  Duration of HD Treatment -hour(s) 3.5 hour(s)  Cumulative Fluid Removed (mL) per Treatment  289.13  HD Safety Checks Performed Yes  Intra-Hemodialysis Comments Tx completed  Dialysis Fluid Bolus Normal Saline  Bolus Amount (mL) 300 mL    Annalaya Wile [facility]

## 2023-06-10 NOTE — Plan of Care (Signed)
Problem: Education: Goal: Knowledge of the prescribed therapeutic regimen will improve 06/10/2023 0018 by Della Goo, RN Outcome: Progressing 06/10/2023 0017 by Della Goo, RN Outcome: Progressing Goal: Ability to verbalize activity precautions or restrictions will improve 06/10/2023 0018 by Della Goo, RN Outcome: Progressing 06/10/2023 0017 by Della Goo, RN Outcome: Progressing Goal: Understanding of discharge needs will improve 06/10/2023 0018 by Della Goo, RN Outcome: Progressing 06/10/2023 0017 by Della Goo, RN Outcome: Progressing   Problem: Clinical Measurements: Goal: Postoperative complications will be avoided or minimized 06/10/2023 0018 by Della Goo, RN Outcome: Progressing 06/10/2023 0017 by Della Goo, RN Outcome: Progressing   Problem: Self-Care: Goal: Ability to meet self-care needs will improve 06/10/2023 0018 by Della Goo, RN Outcome: Progressing 06/10/2023 0017 by Della Goo, RN Outcome: Progressing   Problem: Pain Management: Goal: Pain level will decrease with appropriate interventions 06/10/2023 0018 by Della Goo, RN Outcome: Progressing 06/10/2023 0017 by Della Goo, RN Outcome: Progressing   Problem: Skin Integrity: Goal: Demonstration of wound healing without infection will improve 06/10/2023 0018 by Della Goo, RN Outcome: Progressing 06/10/2023 0017 by Della Goo, RN Outcome: Progressing   Problem: Activity: Goal: Risk for activity intolerance will decrease 06/10/2023 0018 by Della Goo, RN Outcome: Progressing 06/10/2023 0017 by Della Goo, RN Outcome: Progressing   Problem: Nutrition: Goal: Adequate nutrition will be maintained 06/10/2023 0018 by Della Goo, RN Outcome: Progressing 06/10/2023 0017 by Della Goo, RN Outcome: Progressing   Problem: Coping: Goal: Level of anxiety will  decrease 06/10/2023 0018 by Della Goo, RN Outcome: Progressing 06/10/2023 0017 by Della Goo, RN Outcome: Progressing   Problem: Elimination: Goal: Will not experience complications related to bowel motility 06/10/2023 0018 by Della Goo, RN Outcome: Progressing 06/10/2023 0017 by Della Goo, RN Outcome: Progressing   Problem: Pain Managment: Goal: General experience of comfort will improve 06/10/2023 0018 by Della Goo, RN Outcome: Progressing 06/10/2023 0017 by Della Goo, RN Outcome: Progressing   Problem: Safety: Goal: Ability to remain free from injury will improve 06/10/2023 0018 by Della Goo, RN Outcome: Progressing 06/10/2023 0017 by Della Goo, RN Outcome: Progressing   Problem: Skin Integrity: Goal: Risk for impaired skin integrity will decrease 06/10/2023 0018 by Della Goo, RN Outcome: Progressing 06/10/2023 0017 by Della Goo, RN Outcome: Progressing   Problem: Education: Goal: Ability to describe self-care measures that may prevent or decrease complications (Diabetes Survival Skills Education) will improve 06/10/2023 0018 by Della Goo, RN Outcome: Progressing 06/10/2023 0017 by Della Goo, RN Outcome: Progressing Goal: Individualized Educational Video(s) 06/10/2023 0018 by Della Goo, RN Outcome: Progressing 06/10/2023 0017 by Della Goo, RN Outcome: Progressing   Problem: Coping: Goal: Ability to adjust to condition or change in health will improve 06/10/2023 0018 by Della Goo, RN Outcome: Progressing 06/10/2023 0017 by Della Goo, RN Outcome: Progressing   Problem: Fluid Volume: Goal: Ability to maintain a balanced intake and output will improve 06/10/2023 0018 by Della Goo, RN Outcome: Progressing 06/10/2023 0017 by Della Goo, RN Outcome: Progressing   Problem: Health Behavior/Discharge Planning: Goal: Ability  to identify and utilize available resources and services will improve 06/10/2023 0018 by Della Goo, RN Outcome: Progressing 06/10/2023 0017 by Della Goo, RN Outcome: Progressing Goal: Ability to manage health-related needs will improve 06/10/2023 0018 by Della Goo, RN Outcome: Progressing 06/10/2023  0017 by Della Goo, RN Outcome: Progressing   Problem: Metabolic: Goal: Ability to maintain appropriate glucose levels will improve 06/10/2023 0018 by Della Goo, RN Outcome: Progressing 06/10/2023 0017 by Della Goo, RN Outcome: Progressing   Problem: Nutritional: Goal: Maintenance of adequate nutrition will improve 06/10/2023 0018 by Della Goo, RN Outcome: Progressing 06/10/2023 0017 by Della Goo, RN Outcome: Progressing Goal: Progress toward achieving an optimal weight will improve 06/10/2023 0018 by Della Goo, RN Outcome: Progressing 06/10/2023 0017 by Della Goo, RN Outcome: Progressing   Problem: Skin Integrity: Goal: Risk for impaired skin integrity will decrease 06/10/2023 0018 by Della Goo, RN Outcome: Progressing 06/10/2023 0017 by Della Goo, RN Outcome: Progressing   Problem: Tissue Perfusion: Goal: Adequacy of tissue perfusion will improve 06/10/2023 0018 by Della Goo, RN Outcome: Progressing 06/10/2023 0017 by Della Goo, RN Outcome: Progressing   Problem: Education: Goal: Knowledge of disease or condition will improve 06/10/2023 0018 by Della Goo, RN Outcome: Progressing 06/10/2023 0017 by Della Goo, RN Outcome: Progressing Goal: Knowledge of secondary prevention will improve (MUST DOCUMENT ALL) 06/10/2023 0018 by Della Goo, RN Outcome: Progressing 06/10/2023 0017 by Della Goo, RN Outcome: Progressing Goal: Knowledge of patient specific risk factors will improve [name] N/A or DELETE if not current risk  factor) 06/10/2023 0018 by Della Goo, RN Outcome: Progressing 06/10/2023 0017 by Della Goo, RN Outcome: Progressing   Problem: Ischemic Stroke/TIA Tissue Perfusion: Goal: Complications of ischemic stroke/TIA will be minimized 06/10/2023 0018 by Della Goo, RN Outcome: Progressing 06/10/2023 0017 by Della Goo, RN Outcome: Progressing   Problem: Coping: Goal: Will verbalize positive feelings about self 06/10/2023 0018 by Della Goo, RN Outcome: Progressing 06/10/2023 0017 by Della Goo, RN Outcome: Progressing Goal: Will identify appropriate support needs 06/10/2023 0018 by Della Goo, RN Outcome: Progressing 06/10/2023 0017 by Della Goo, RN Outcome: Progressing   Problem: Health Behavior/Discharge Planning: Goal: Ability to manage health-related needs will improve 06/10/2023 0018 by Della Goo, RN Outcome: Progressing 06/10/2023 0017 by Della Goo, RN Outcome: Progressing Goal: Goals will be collaboratively established with patient/family 06/10/2023 0018 by Della Goo, RN Outcome: Progressing 06/10/2023 0017 by Della Goo, RN Outcome: Progressing   Problem: Self-Care: Goal: Ability to participate in self-care as condition permits will improve 06/10/2023 0018 by Della Goo, RN Outcome: Progressing 06/10/2023 0017 by Della Goo, RN Outcome: Progressing Goal: Verbalization of feelings and concerns over difficulty with self-care will improve 06/10/2023 0018 by Della Goo, RN Outcome: Progressing 06/10/2023 0017 by Della Goo, RN Outcome: Progressing Goal: Ability to communicate needs accurately will improve 06/10/2023 0018 by Della Goo, RN Outcome: Progressing 06/10/2023 0017 by Della Goo, RN Outcome: Progressing   Problem: Nutrition: Goal: Risk of aspiration will decrease 06/10/2023 0018 by Della Goo, RN Outcome:  Progressing 06/10/2023 0017 by Della Goo, RN Outcome: Progressing Goal: Dietary intake will improve 06/10/2023 0018 by Della Goo, RN Outcome: Progressing 06/10/2023 0017 by Della Goo, RN Outcome: Progressing

## 2023-06-10 NOTE — Plan of Care (Signed)
  Problem: Education: Goal: Knowledge of the prescribed therapeutic regimen will improve Outcome: Progressing Goal: Ability to verbalize activity precautions or restrictions will improve Outcome: Progressing Goal: Understanding of discharge needs will improve Outcome: Progressing   Problem: Clinical Measurements: Goal: Postoperative complications will be avoided or minimized Outcome: Progressing   Problem: Self-Care: Goal: Ability to meet self-care needs will improve Outcome: Progressing   Problem: Pain Management: Goal: Pain level will decrease with appropriate interventions Outcome: Progressing   Problem: Skin Integrity: Goal: Demonstration of wound healing without infection will improve Outcome: Progressing   Problem: Activity: Goal: Risk for activity intolerance will decrease Outcome: Progressing   Problem: Nutrition: Goal: Adequate nutrition will be maintained Outcome: Progressing   Problem: Coping: Goal: Level of anxiety will decrease Outcome: Progressing   Problem: Elimination: Goal: Will not experience complications related to bowel motility Outcome: Progressing   Problem: Pain Managment: Goal: General experience of comfort will improve Outcome: Progressing   Problem: Safety: Goal: Ability to remain free from injury will improve Outcome: Progressing   Problem: Skin Integrity: Goal: Risk for impaired skin integrity will decrease Outcome: Progressing   Problem: Education: Goal: Ability to describe self-care measures that may prevent or decrease complications (Diabetes Survival Skills Education) will improve Outcome: Progressing Goal: Individualized Educational Video(s) Outcome: Progressing   Problem: Coping: Goal: Ability to adjust to condition or change in health will improve Outcome: Progressing   Problem: Fluid Volume: Goal: Ability to maintain a balanced intake and output will improve Outcome: Progressing   Problem: Health Behavior/Discharge  Planning: Goal: Ability to identify and utilize available resources and services will improve Outcome: Progressing Goal: Ability to manage health-related needs will improve Outcome: Progressing   Problem: Metabolic: Goal: Ability to maintain appropriate glucose levels will improve Outcome: Progressing   Problem: Nutritional: Goal: Maintenance of adequate nutrition will improve Outcome: Progressing Goal: Progress toward achieving an optimal weight will improve Outcome: Progressing   Problem: Skin Integrity: Goal: Risk for impaired skin integrity will decrease Outcome: Progressing   Problem: Tissue Perfusion: Goal: Adequacy of tissue perfusion will improve Outcome: Progressing   Problem: Education: Goal: Knowledge of disease or condition will improve Outcome: Progressing Goal: Knowledge of secondary prevention will improve (MUST DOCUMENT ALL) Outcome: Progressing Goal: Knowledge of patient specific risk factors will improve [name] N/A or DELETE if not current risk factor) Outcome: Progressing   Problem: Ischemic Stroke/TIA Tissue Perfusion: Goal: Complications of ischemic stroke/TIA will be minimized Outcome: Progressing   Problem: Coping: Goal: Will verbalize positive feelings about self Outcome: Progressing Goal: Will identify appropriate support needs Outcome: Progressing   Problem: Health Behavior/Discharge Planning: Goal: Ability to manage health-related needs will improve Outcome: Progressing Goal: Goals will be collaboratively established with patient/family Outcome: Progressing   Problem: Self-Care: Goal: Ability to participate in self-care as condition permits will improve Outcome: Progressing Goal: Verbalization of feelings and concerns over difficulty with self-care will improve Outcome: Progressing Goal: Ability to communicate needs accurately will improve Outcome: Progressing   Problem: Nutrition: Goal: Risk of aspiration will decrease Outcome:  Progressing Goal: Dietary intake will improve Outcome: Progressing

## 2023-06-10 NOTE — Progress Notes (Signed)
PROGRESS NOTE    [name]  YNW:[id number] DOB: [birth year] DOA: 05/21/2023 PCP: Inc, [facility]     Brief Narrative:  42 year old with history of DM2, ESRD, bilateral AKA, blindness, PAD initially presented with hypotension from dialysis.  Had a recent prolonged stay in the hospital in September 2024 for small bowel ischemia complicated by SBO and had extended ileocecectomy and discharged to SNF.  Patient has had HD catheter in the right chest wall due to difficulty accessing her AV fistula.  Eventually at SNF found to be also hypotensive during her HD therefore admitted to the ICU and PCCM was initially consulted.  Eventually she was weaned off pressors on 10/16 and transferred to [facility].  During this time it has been very difficult to get in touch with patient's [facility] POA, Sister [name] to get any formal consents.  Eventually ethics was consulted and stated next point of contact should be her husband.  Assessment & Plan:   Principal Problem:   Septic shock (HCC)   Goals of care discussion  Difficulty getting touch with patient's healthcare power of attorney, [name] sister.  Ethics was consulted. Ok to contact "next of kin" who is her husband. Palliative is having on going discussions with him.   New onset congestive heart failure with reduced EF 25% -Echocardiogram 2023 had preserved EF, echo done on 05/29/2023 showed EF of 25%.  New change, seen by cardiology but due to multiple comorbidities, continue current management.  No further intervention planned.   End-stage renal disease, likely diabetic nephropathy HD per nephrology.  AVF versus use of HD catheter per nephrology Status post IR right arm fistulogram with balloon angioplasty Refusing HD   Septic Shock secondary to suspected wound/skin/soft tissue infection, POA Acute metabolic encephalopathy Patient has completed all antibiotic course at this point.  Currently on midodrine 10 mg 3 times daily. Albumin  prn.    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  LDL 19, A1c 5.7.   DVT prophylaxis: heparin injection 5,000 Units Start: 05/17/2023 2200 Code Status:   Code Status: Full Code Family Communication: None at bedside today Continue hospital stay until HD access has been addressed.     Subjective: Does not really want to engage in any conversation this morning.  Appears comfortable   Examination:   General exam: Chronically ill Respiratory system: Some rhonchi at the bases Cardiovascular system: S1 & S2 heard, RRR. No JVD, murmurs, rubs, gallops or clicks. No pedal edema. Gastrointestinal system: Wound dressing noted Central nervous system: Alert and oriented. No focal neurological deficits. Extremities: Bilateral AKA Skin: No rashes, lesions or ulcers Psychiatry: Judgement and insight appear poor Right upper extremity fistula        .   Pressure Injury 05/25/2023 Buttocks Left Stage 2 -  Partial thickness loss of dermis presenting as a shallow open injury with a red, pink wound bed without slough. (Active)  06/12/2023 2128  Location: Buttocks  Location Orientation: Left  Staging: Stage 2 -  Partial thickness loss of dermis presenting as a shallow open injury with a red, pink wound bed without slough.  Wound Description (Comments):   Present on Admission: Yes     Pressure Injury 05/14/2023 Sacrum Stage 1 -  Intact skin with non-blanchable redness of a localized area usually over a bony prominence. Old,  healed pressure injury (Active)  05/17/2023 2128  Location: Sacrum  Location Orientation:   Staging: Stage 1 -  Intact skin with non-blanchable redness of a localized area usually over a bony prominence.  Wound Description (Comments): Old, healed pressure injury  Present on  Admission: Yes     Diet Orders (From admission, onward)     Start     Ordered   06/05/23 1144  Diet regular Room service appropriate? Yes; Fluid consistency: Thin  Diet effective now       Question Answer Comment  Room service appropriate? Yes   Fluid consistency: Thin      06/05/23 1143            Objective: Vitals:   06/10/23 0541 06/10/23 0542 06/10/23 0600 06/10/23 0747  BP: (!) 53/17  100/62 (!) 88/75  Pulse: 66   66  Resp: 18   18  Temp: 98 F (36.7 C)   98.4 F (36.9 C)  TempSrc: Oral   Oral  SpO2: 99%   100%  Weight:  42.5 kg      Intake/Output Summary (Last 24 hours) at 06/10/2023 1115 Last data filed at 06/10/2023 0806 Gross per 24 hour  Intake 120 ml  Output --  Net 120 ml   Filed Weights   06/08/23 0326 06/09/23 0551 06/10/23 0542  Weight: 42.9 kg 42.7 kg 42.5 kg    Scheduled Meds:  aspirin EC  81 mg Oral Daily   atorvastatin  20 mg Oral Daily   Chlorhexidine Gluconate Cloth  6 each Topical Q0600   darbepoetin (ARANESP) injection - DIALYSIS  60 mcg Subcutaneous Q Fri-1800   dorzolamide-timolol  1 drop Left Eye BID   dronabinol  5 mg Oral BID AC   feeding supplement (NEPRO CARB STEADY)  237 mL Oral BID BM   heparin  5,000 Units Subcutaneous Q8H   LORazepam  1 mg Intravenous Once   midodrine  10 mg Oral TID WC   Continuous Infusions:  Nutritional status     Body mass index is 17.7 kg/m.  Data Reviewed:   CBC: Recent Labs  Lab 06/06/23 0130 06/07/23 0911 06/08/23 0411 06/09/23 0955  WBC 12.2* 11.0* 9.0 9.7  HGB 10.4* 10.8* 11.2* 11.7*  HCT 33.8* 35.0* 37.3 37.9  MCV 96.8 96.2 94.9 94.0  PLT 250 255 249 240   Basic Metabolic Panel: Recent Labs  Lab 06/06/23 0130 06/07/23 0904 06/07/23 0911 06/08/23 0411 06/09/23 0955  NA 138  --  139 138 139  K 4.3  --  4.6 4.7 5.1  CL 99  --  97* 96* 99  CO2 24  --  23 25 25   GLUCOSE 231*  --  135* 281* 156*  BUN 33*  --  24* 32* 39*  CREATININE 5.63*  --  4.73* 5.90* 7.18*   CALCIUM 9.1  --  10.1 10.0 9.6  MG 1.8  --  1.8 1.8 1.7  PHOS  --  5.3*  --   --   --    GFR: Estimated Creatinine Clearance: 6.9 mL/min (A) (by C-G formula based on SCr of 7.18 mg/dL (H)). Liver Function Tests: Recent Labs  Lab 06/06/23 0130 06/07/23 0911 06/08/23 0411 06/09/23 0955  AST 15 19 15 16   ALT 17 16 16 14   ALKPHOS 69 76 84 86  BILITOT 0.9 1.0 0.9 0.6  PROT 6.1* 7.2 7.0 7.0  ALBUMIN 1.8* 2.9* 2.4* 2.2*   No results for input(s): "LIPASE", "AMYLASE" in the last 168  hours. No results for input(s): "AMMONIA" in the last 168 hours. Coagulation Profile: No results for input(s): "INR", "PROTIME" in the last 168 hours. Cardiac Enzymes: No results for input(s): "CKTOTAL", "CKMB", "CKMBINDEX", "TROPONINI" in the last 168 hours. BNP (last 3 results) No results for input(s): "PROBNP" in the last 8760 hours. HbA1C: No results for input(s): "HGBA1C" in the last 72 hours. CBG: Recent Labs  Lab 06/09/23 0822 06/09/23 1146 06/09/23 1541 06/10/23 0016 06/10/23 0804  GLUCAP 158* 125* 139* 115* 105*   Lipid Profile: No results for input(s): "CHOL", "HDL", "LDLCALC", "TRIG", "CHOLHDL", "LDLDIRECT" in the last 72 hours. Thyroid Function Tests: No results for input(s): "TSH", "T4TOTAL", "FREET4", "T3FREE", "THYROIDAB" in the last 72 hours. Anemia Panel: No results for input(s): "VITAMINB12", "FOLATE", "FERRITIN", "TIBC", "IRON", "RETICCTPCT" in the last 72 hours. Sepsis Labs: No results for input(s): "PROCALCITON", "LATICACIDVEN" in the last 168 hours.  No results found for this or any previous visit (from the past 240 hour(s)).       Radiology Studies: No results found.         LOS: 14 days   Time spent= 35 mins    Miguel Rota, MD [facility]  If 7PM-7AM, please contact night-coverage  06/10/2023, 11:15 AM

## 2023-06-10 NOTE — Progress Notes (Addendum)
Daily Progress Note   Patient Name: [name]       Date: 06/10/2023 DOB: 1980-08-25  Age: 42 y.o. MRN#: [id number] Attending Physician: Tracey Rota, MD Primary Care Physician: [facility], [facility] Admit Date: 06/03/2023  Reason for Consultation/Follow-up: Establishing goals of care  Subjective: Medical records reviewed including progress notes, MAR, labs, and imaging. Patient assessed at the bedside. She reports 10/10 lower back pain. Unable to receive PRN oxycodone again at the time of my visit and declined APAP. No family present during my visit.  Explored patient's willingness to attempt topical pain management strategies including heat pad and lidocaine patch. She states she has never tried them before but would be willing to. Unable to progress with GOC conversation as she repeatedly states "I'm hurting bad." Discussed plan with RN. Informed patient that I would be calling her sister for further conversation and she acknowledged this.  I then attempted to called patient's sister/HCPOA by phone but was unable to contact her and her voicemail box has not been set up.  Questions and concerns addressed. PMT will continue to support holistically.   Length of Stay: 14   Physical Exam Vitals and nursing note reviewed.  Constitutional:      General: She is not in acute distress.    Appearance: She is ill-appearing.  Cardiovascular:     Rate and Rhythm: Normal rate.  Pulmonary:     Effort: Pulmonary effort is normal.  Neurological:     Mental Status: She is easily aroused. She is disoriented and confused.  Psychiatric:        Mood and Affect: Affect is flat.        Cognition and Memory: Cognition is impaired.            Vital Signs: BP (!) 88/75 (BP Location: Left  Arm)   Pulse 66   Temp 98.4 F (36.9 C) (Oral)   Resp 18   Wt 42.5 kg   SpO2 100%   BMI 17.70 kg/m  SpO2: SpO2: 100 % O2 Device: O2 Device: Room Air O2 Flow Rate: O2 Flow Rate (L/min): 2 L/min      Palliative Assessment/Data: 30%    Palliative Care Assessment & Plan   Patient Profile: 42 y.o. female  with past medical history of insulin-dependent diabetes mellitus, resultant end-stage renal disease, bilateral AKA, blindness and severe peripheral arterial disease admitted on 05/29/2023 with hypotension in dialysis.    She had a recent prolonged hospital stay in September 2024 for small bowel ischemia with resultant small bowel obstruction. She had an extended ileal cecotomy placed. She was discharged to SNF 3 days before this admission.  Per chart, patient was not allowing dressing changes for her abdominal wound.  She is now admitted with septic shock secondary to suspected wound infection and acute encephalopathy.   PMT has been consulted to assist with goals of care conversation.  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 New CHF with EF of less than 25%  Recommendations/Plan: Continue full code/full scope treatment Ordered k pad and lidocaine patch for lower back pain Attempted to  reach patient's sister/HCPOA again today but was unable to reach or leave a voicemail; her inbox is not set up Per chart, patient has continued to refuse interventions at times, including HD, but is now willing. Her sister visited on 10/25 and contact information was updated in the chart Continue efforts at [facility] conversations as patient/family are willing and able. This remains difficult PMT will continue to follow and support intermittently    Prognosis: Guarded to poor given several chronic comorbidities, repeat hospitalizations, concerns with adherence to care plan   Discharge  Planning: SNF   Care plan was discussed with patient, RN  Total time: I spent 35 minutes in the care of the patient today in the above activities and documenting the encounter.         Tracey Noah Jeni Salles, PA-C  Palliative Medicine Team Team phone # [phone]  Thank you for allowing the Palliative Medicine Team to assist in the care of this patient. Please utilize secure chat with additional questions, if there is no response within 30 minutes please call the above phone number.  Palliative Medicine Team providers are available by phone from 7am to 7pm daily and can be reached through the team cell phone.  Should this patient require assistance outside of these hours, please call the patient's attending physician.

## 2023-06-10 NOTE — Progress Notes (Signed)
[facility] Progress Note   Subjective:    Seen and examined patient at bedside. Resting in bed c/o generalized pain all over. BP remains soft and on Midodrine. HD today, UFG set 0.5L as tolerated.  Objective Vitals:   06/10/23 0541 06/10/23 0542 06/10/23 0600 06/10/23 0747  BP: (!) 53/17  100/62 (!) 88/75  Pulse: 66   66  Resp: 18   18  Temp: 98 F (36.7 C)   98.4 F (36.9 C)  TempSrc: Oral   Oral  SpO2: 99%   100%  Weight:  42.5 kg     Physical Exam General: Chronically ill appearing woman, NAD. Room air. Heart: RRR; no murmur Lungs: CTA anteriorly Abdomen: dressings/wound vac in place Extremities: B AKA; no RUE edema Dialysis Access: RUE AVF + bruit  Filed Weights   06/08/23 0326 06/09/23 0551 06/10/23 0542  Weight: 42.9 kg 42.7 kg 42.5 kg    Intake/Output Summary (Last 24 hours) at 06/10/2023 0848 Last data filed at 06/10/2023 0806 Gross per 24 hour  Intake 120 ml  Output --  Net 120 ml    Additional Objective Labs: Basic Metabolic Panel: Recent Labs  Lab 06/07/23 0904 06/07/23 0911 06/08/23 0411 06/09/23 0955  NA  --  139 138 139  K  --  4.6 4.7 5.1  CL  --  97* 96* 99  CO2  --  23 25 25   GLUCOSE  --  135* 281* 156*  BUN  --  24* 32* 39*  CREATININE  --  4.73* 5.90* 7.18*  CALCIUM  --  10.1 10.0 9.6  PHOS 5.3*  --   --   --    Liver Function Tests: Recent Labs  Lab 06/07/23 0911 06/08/23 0411 06/09/23 0955  AST 19 15 16   ALT 16 16 14   ALKPHOS 76 84 86  BILITOT 1.0 0.9 0.6  PROT 7.2 7.0 7.0  ALBUMIN 2.9* 2.4* 2.2*   No results for input(s): "LIPASE", "AMYLASE" in the last 168 hours. CBC: Recent Labs  Lab 06/03/23 0955 06/06/23 0130 06/07/23 0911 06/08/23 0411 06/09/23 0955  WBC 12.1* 12.2* 11.0* 9.0 9.7  HGB 10.6* 10.4* 10.8* 11.2* 11.7*  HCT 33.7* 33.8* 35.0* 37.3 37.9  MCV 96.0 96.8 96.2 94.9 94.0  PLT 210 250 255 249 240   Blood Culture    Component Value Date/Time   SDES BLOOD LEFT ARM 05/27/2023 1527    SPECREQUEST  05/27/2023 1527    BOTTLES DRAWN AEROBIC AND ANAEROBIC Blood Culture adequate volume   CULT  05/27/2023 1527    NO GROWTH 5 DAYS Performed at [facility], [address] [place], [place] [address]    REPTSTATUS 06/01/2023 FINAL 05/27/2023 1527    Cardiac Enzymes: No results for input(s): "CKTOTAL", "CKMB", "CKMBINDEX", "TROPONINI" in the last 168 hours. CBG: Recent Labs  Lab 06/09/23 0822 06/09/23 1146 06/09/23 1541 06/10/23 0016 06/10/23 0804  GLUCAP 158* 125* 139* 115* 105*   Iron Studies: No results for input(s): "IRON", "TIBC", "TRANSFERRIN", "FERRITIN" in the last 72 hours. Lab Results  Component Value Date   INR 1.9 (H) 05/14/2023   INR 1.3 (H) 04/30/2023   INR 1.3 (H) 06/28/2022   Studies/Results: No results found.  Medications:   aspirin EC  81 mg Oral Daily   atorvastatin  20 mg Oral Daily   Chlorhexidine Gluconate Cloth  6 each Topical Q0600   darbepoetin (ARANESP) injection - DIALYSIS  60 mcg Subcutaneous Q Fri-1800   dorzolamide-timolol  1 drop  Left Eye BID   dronabinol  5 mg Oral BID AC   feeding supplement (NEPRO CARB STEADY)  237 mL Oral BID BM   heparin  5,000 Units Subcutaneous Q8H   LORazepam  1 mg Intravenous Once   midodrine  10 mg Oral TID WC    Dialysis Orders: [facility] MWF  400/1.5 auto, 2K/2Ca, EDW 49kg, mircera 75 q2wks   Assessment/Plan: 1. Shock: presumed septic, now off pressors, on midodrine. Suspected related to abd wound.  S/p abx, per primary 2. 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 Necrotic tissue noted in wound and around ileostomy.  WC and surgery following.   3. 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  CTA completed. EEG with no seizure like activity. Per Neuro.  4. ESRD - MWF schedule dialysis on schedule. S/p fgram 10/23 with IR: multiple areas of narrowing, s/p balloon angioplasty. Refused HD 10/25, 10/26. Next HD Monday 10/28. 5. HTN/volume - Hypotensive, on midodrine.  Volume status improved - WELL below prior EDW, will adjust at dc. 6. Anemia of CKD- Hgb 11.7 - continue Aranesp q Fri 7. Secondary hyperparathyroidism - CorrCa high. Binders on hold. Senispar on hold d/t GI issues. Not on VDRA.  Repeat PTH and Phos. 8. Nutrition - Alb 2.4, continue supps.  Salome Holmes, NP [facility] 06/10/2023,8:48 AM  LOS: 14 days

## 2023-06-11 ENCOUNTER — Other Ambulatory Visit: Payer: Self-pay

## 2023-06-11 DIAGNOSIS — R6521 Severe sepsis with septic shock: Secondary | ICD-10-CM | POA: Diagnosis not present

## 2023-06-11 DIAGNOSIS — A419 Sepsis, unspecified organism: Secondary | ICD-10-CM | POA: Diagnosis not present

## 2023-06-11 LAB — MAGNESIUM: Magnesium: 1.5 mg/dL — ABNORMAL LOW (ref 1.7–2.4)

## 2023-06-11 LAB — CBC
HCT: 42.7 % (ref 36.0–46.0)
Hemoglobin: 12.8 g/dL (ref 12.0–15.0)
MCH: 28.4 pg (ref 26.0–34.0)
MCHC: 30 g/dL (ref 30.0–36.0)
MCV: 94.9 fL (ref 80.0–100.0)
Platelets: 224 10*3/uL (ref 150–400)
RBC: 4.5 MIL/uL (ref 3.87–5.11)
RDW: 21.6 % — ABNORMAL HIGH (ref 11.5–15.5)
WBC: 9.8 10*3/uL (ref 4.0–10.5)
nRBC: 0.4 % — ABNORMAL HIGH (ref 0.0–0.2)

## 2023-06-11 LAB — GLUCOSE, CAPILLARY
Glucose-Capillary: 133 mg/dL — ABNORMAL HIGH (ref 70–99)
Glucose-Capillary: 143 mg/dL — ABNORMAL HIGH (ref 70–99)
Glucose-Capillary: 191 mg/dL — ABNORMAL HIGH (ref 70–99)

## 2023-06-11 MED ORDER — CHLORHEXIDINE GLUCONATE CLOTH 2 % EX PADS
6.0000 | MEDICATED_PAD | Freq: Every day | CUTANEOUS | Status: DC
  Administered 2023-06-12 – 2023-06-13 (×2): 6 via TOPICAL

## 2023-06-11 MED ORDER — MAGNESIUM SULFATE 2 GM/50ML IV SOLN
2.0000 g | Freq: Once | INTRAVENOUS | Status: AC
  Administered 2023-06-11: 2 g via INTRAVENOUS
  Filled 2023-06-11: qty 50

## 2023-06-11 NOTE — Plan of Care (Signed)
  Problem: Education: Goal: Knowledge of the prescribed therapeutic regimen will improve Outcome: Progressing Goal: Ability to verbalize activity precautions or restrictions will improve Outcome: Progressing Goal: Understanding of discharge needs will improve Outcome: Progressing   Problem: Clinical Measurements: Goal: Postoperative complications will be avoided or minimized Outcome: Progressing   Problem: Self-Care: Goal: Ability to meet self-care needs will improve Outcome: Progressing   Problem: Pain Management: Goal: Pain level will decrease with appropriate interventions Outcome: Progressing   Problem: Skin Integrity: Goal: Demonstration of wound healing without infection will improve Outcome: Progressing   Problem: Activity: Goal: Risk for activity intolerance will decrease Outcome: Progressing   Problem: Nutrition: Goal: Adequate nutrition will be maintained Outcome: Progressing   Problem: Coping: Goal: Level of anxiety will decrease Outcome: Progressing   Problem: Elimination: Goal: Will not experience complications related to bowel motility Outcome: Progressing   Problem: Pain Managment: Goal: General experience of comfort will improve Outcome: Progressing   Problem: Safety: Goal: Ability to remain free from injury will improve Outcome: Progressing   Problem: Skin Integrity: Goal: Risk for impaired skin integrity will decrease Outcome: Progressing   Problem: Education: Goal: Ability to describe self-care measures that may prevent or decrease complications (Diabetes Survival Skills Education) will improve Outcome: Progressing Goal: Individualized Educational Video(s) Outcome: Progressing   Problem: Coping: Goal: Ability to adjust to condition or change in health will improve Outcome: Progressing   Problem: Fluid Volume: Goal: Ability to maintain a balanced intake and output will improve Outcome: Progressing   Problem: Health Behavior/Discharge  Planning: Goal: Ability to identify and utilize available resources and services will improve Outcome: Progressing Goal: Ability to manage health-related needs will improve Outcome: Progressing   Problem: Metabolic: Goal: Ability to maintain appropriate glucose levels will improve Outcome: Progressing   Problem: Nutritional: Goal: Maintenance of adequate nutrition will improve Outcome: Progressing Goal: Progress toward achieving an optimal weight will improve Outcome: Progressing   Problem: Skin Integrity: Goal: Risk for impaired skin integrity will decrease Outcome: Progressing   Problem: Tissue Perfusion: Goal: Adequacy of tissue perfusion will improve Outcome: Progressing   Problem: Education: Goal: Knowledge of disease or condition will improve Outcome: Progressing Goal: Knowledge of secondary prevention will improve (MUST DOCUMENT ALL) Outcome: Progressing Goal: Knowledge of patient specific risk factors will improve [name] N/A or DELETE if not current risk factor) Outcome: Progressing   Problem: Ischemic Stroke/TIA Tissue Perfusion: Goal: Complications of ischemic stroke/TIA will be minimized Outcome: Progressing   Problem: Coping: Goal: Will verbalize positive feelings about self Outcome: Progressing Goal: Will identify appropriate support needs Outcome: Progressing   Problem: Health Behavior/Discharge Planning: Goal: Ability to manage health-related needs will improve Outcome: Progressing Goal: Goals will be collaboratively established with patient/family Outcome: Progressing   Problem: Self-Care: Goal: Ability to participate in self-care as condition permits will improve Outcome: Progressing Goal: Verbalization of feelings and concerns over difficulty with self-care will improve Outcome: Progressing Goal: Ability to communicate needs accurately will improve Outcome: Progressing   Problem: Nutrition: Goal: Risk of aspiration will decrease Outcome:  Progressing Goal: Dietary intake will improve Outcome: Progressing

## 2023-06-11 NOTE — Progress Notes (Signed)
[facility] Progress Note   Subjective:    Seen and examined patient at bedside. Noted minimal UF (around ) 2nd ongoing hypotension. On Midodrine. She reports feeling fine and denies SOB, CP, and N/V.   Objective Vitals:   06/10/23 2036 06/11/23 0117 06/11/23 0652 06/11/23 0759  BP: (!) 95/53 (!) 90/52 (!) 81/61 100/60  Pulse: 78 70 82 80  Resp: 18 18 18 17   Temp: 98 F (36.7 C) 98 F (36.7 C) 98.6 F (37 C) 98.1 F (36.7 C)  TempSrc: Oral Oral Oral Oral  SpO2: 98% 98% 100% 95%  Weight:       Physical Exam General: Chronically ill appearing woman, NAD. Room air. Heart: RRR; no murmur Lungs: CTA anteriorly Abdomen: dressings/wound vac in place Extremities: B AKA; no RUE edema Dialysis Access: RUE AVF + bruit  Filed Weights   06/10/23 0542 06/10/23 1441 06/10/23 1913  Weight: 42.5 kg 44 kg 43.5 kg    Intake/Output Summary (Last 24 hours) at 06/11/2023 1104 Last data filed at 06/11/2023 0900 Gross per 24 hour  Intake 240 ml  Output 100 ml  Net 140 ml    Additional Objective Labs: Basic Metabolic Panel: Recent Labs  Lab 06/07/23 0904 06/07/23 0911 06/08/23 0411 06/09/23 0955 06/10/23 1448  NA  --    < > 138 139 138  K  --    < > 4.7 5.1 4.5  CL  --    < > 96* 99 99  CO2  --    < > 25 25 26   GLUCOSE  --    < > 281* 156* 105*  BUN  --    < > 32* 39* 42*  CREATININE  --    < > 5.90* 7.18* 8.37*  CALCIUM  --    < > 10.0 9.6 9.3  PHOS 5.3*  --   --   --  6.4*   < > = values in this interval not displayed.   Liver Function Tests: Recent Labs  Lab 06/08/23 0411 06/09/23 0955 06/10/23 1448  AST 15 16 15   ALT 16 14 14   ALKPHOS 84 86 83  BILITOT 0.9 0.6 0.8  PROT 7.0 7.0 6.8  ALBUMIN 2.4* 2.2* 2.1*   No results for input(s): "LIPASE", "AMYLASE" in the last 168 hours. CBC: Recent Labs  Lab 06/07/23 0911 06/08/23 0411 06/09/23 0955 06/10/23 1448 06/11/23 0711  WBC 11.0* 9.0 9.7 10.1 9.8  HGB 10.8* 11.2* 11.7* 11.5* 12.8  HCT  35.0* 37.3 37.9 37.3 42.7  MCV 96.2 94.9 94.0 95.4 94.9  PLT 255 249 240 272 224   Blood Culture    Component Value Date/Time   SDES BLOOD LEFT ARM 05/27/2023 1527   SPECREQUEST  05/27/2023 1527    BOTTLES DRAWN AEROBIC AND ANAEROBIC Blood Culture adequate volume   CULT  05/27/2023 1527    NO GROWTH 5 DAYS Performed at [facility], [address] [place], [place] [address]    REPTSTATUS 06/01/2023 FINAL 05/27/2023 1527    Cardiac Enzymes: No results for input(s): "CKTOTAL", "CKMB", "CKMBINDEX", "TROPONINI" in the last 168 hours. CBG: Recent Labs  Lab 06/10/23 0016 06/10/23 0804 06/10/23 1144 06/11/23 0035 06/11/23 0757  GLUCAP 115* 105* 128* 133* 143*   Iron Studies: No results for input(s): "IRON", "TIBC", "TRANSFERRIN", "FERRITIN" in the last 72 hours. Lab Results  Component Value Date   INR 1.9 (H) 05/14/2023   INR 1.3 (H) 04/30/2023   INR 1.3 (H) 06/28/2022  Studies/Results: No results found.  Medications:   aspirin EC  81 mg Oral Daily   atorvastatin  20 mg Oral Daily   Chlorhexidine Gluconate Cloth  6 each Topical Q0600   darbepoetin (ARANESP) injection - DIALYSIS  60 mcg Subcutaneous Q Fri-1800   dorzolamide-timolol  1 drop Left Eye BID   dronabinol  5 mg Oral BID AC   feeding supplement (NEPRO CARB STEADY)  237 mL Oral BID BM   heparin  5,000 Units Subcutaneous Q8H   lidocaine  1 patch Transdermal Q24H   LORazepam  1 mg Intravenous Once   midodrine  10 mg Oral TID WC    Dialysis Orders: [facility] MWF  400/1.5 auto, 2K/2Ca, EDW 49kg, mircera 75 q2wks   Assessment/Plan: 1. Shock: presumed septic, now off pressors, on midodrine. Suspected related to abd wound.  S/p abx, per primary 2. 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 Necrotic tissue noted in wound and around ileostomy.  WC and surgery following.   3. 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  CTA completed. EEG with no seizure like activity. Per Neuro.  4.  ESRD - MWF schedule dialysis on schedule. S/p fgram 10/23 with IR: multiple areas of narrowing, s/p balloon angioplasty. Refused HD 10/25 and 10/26. S/p HD 10/28. Next HD 10/30 5. HTN/volume - Hypotensive, on midodrine. Okay to give an extra Midodrine dose mid-run if needed. Volume status improved - WELL below prior EDW, will adjust at dc. 6. Anemia of CKD- Hgb 11.7 - continue Aranesp q Fri 7. Secondary hyperparathyroidism - CorrCa high. Binders on hold. Senispar on hold d/t GI issues. Not on VDRA.  Repeat PTH and Phos. 8. Nutrition - Alb 2.4, continue supps.  Tracey Holmes, NP [facility] 06/11/2023,11:04 AM  LOS: 15 days

## 2023-06-11 NOTE — Progress Notes (Signed)
Speech Language Pathology Treatment: Cognitive-Linquistic  Patient Details Name: [name] MRN: [id number] DOB: 1980-12-16 Today's Date: 06/11/2023 Time: 0940-1000 SLP Time Calculation (min) (ACUTE ONLY): 20 min  Assessment / Plan / Recommendation Clinical Impression  Patient seen for cognitive treatment. Unfortunately patient remains very distracted by pain, grimacing and complaining of back pain which was helped with repositioning. Patient remains oriented to self and place only. She has very poor sustained attention requiring max verbal cueing to attend to conversation, often becoming verbally agitated when prompted to answer questions. Problem solving for basic tasks is impaired requiring max verbal, tactile cueing to navigate. Speech intelligible however appearing with language of confusion requiring being provided with a choice of two in order to answer basic biographical questions. Unable to reposition for maximum participation due to ongoing pain which could also be impacting cognition. Will continue efforts.    HPI HPI: [name] is a 42 year old woman with insulin-dependent diabetes mellitus, resultant end-stage renal disease, bilateral AKA, blindness and severe peripheral arterial disease.  She presented on 05/28/23 to hospital hypotensive from dialysis.  She had a recent prolonged hospital stay in September 2024 for small bowel ischemia with resultant small bowel obstruction.   She was discharged 3 days ago to SNF.  Of note her AV fistula cannot be accessed due to pseudoaneurysm and repeated clotting.  She thus has a dialysis catheter placed in the right chest.  There is report that she was not adherent to dressing changes for her open abdominal wound, and did not agree to wound VAC.  She was found to be hypotensive in the ED and started on 2 vasopressors.  MRI on 10/15 indicated R frontal infarct. ST consulted for speech/language cognitive assessment.      SLP Plan  Continue with  current plan of care      Recommendations for follow up therapy are one component of a multi-disciplinary discharge planning process, led by the attending physician.  Recommendations may be updated based on patient status, additional functional criteria and insurance authorization.    Recommendations                      Anuj Summons MA, CCC-SLP  Anush Wiedeman Meryl  06/11/2023, 10:06 AM

## 2023-06-11 NOTE — Progress Notes (Signed)
PROGRESS NOTE    [name]  UKG:[id number] DOB: 05/10/1981 DOA: 05/15/2023 PCP: Inc, [facility]     Brief Narrative:  42 year old with history of DM2, ESRD, bilateral AKA, blindness, PAD initially presented with hypotension from dialysis.  Had a recent prolonged stay in the hospital in September 2024 for small bowel ischemia complicated by SBO and had extended ileocecectomy and discharged to SNF.  Patient has had HD catheter in the right chest wall due to difficulty accessing her AV fistula.  Eventually at SNF found to be also hypotensive during her HD therefore admitted to the ICU and PCCM was initially consulted.  Eventually she was weaned off pressors on 10/16 and transferred to [facility].  During this time it has been very difficult to get in touch with patient's [facility] POA, Sister [name] to get any formal consents.  Eventually ethics was consulted and stated next point of contact should be her husband.  Assessment & Plan:   Principal Problem:   Septic shock (HCC)   Goals of care discussion  Difficulty getting touch with patient's healthcare power of attorney, [name] sister.  Ethics was consulted. Ok to contact "next of kin" who is her husband. Palliative is having on going discussions with him.   New onset congestive heart failure with reduced EF 25% -Echocardiogram 2023 had preserved EF, echo done on 05/29/2023 showed EF of 25%.  New change, seen by cardiology but due to multiple comorbidities, continue current management.  No further intervention planned.   End-stage renal disease, likely diabetic nephropathy HD per nephrology.  AVF versus use of HD catheter per nephrology Status post IR right arm fistulogram with balloon angioplasty Occasionally refuses HD, last session 11/28.   Septic Shock secondary to suspected wound/skin/soft tissue infection, POA Acute metabolic encephalopathy Patient has completed all antibiotic course at this point.  Currently on  midodrine 10 mg 3 times daily. Albumin prn.    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  LDL 19, A1c 5.7.   DVT prophylaxis: heparin injection 5,000 Units Start: 05/26/2023 2200 Code Status:   Code Status: Full Code Family Communication: None at bedside today Continue hospital stay until HD access has been addressed.     Subjective: No complaints.  Does not want to participate much in conversation this morning.  Tolerated hemodialysis yesterday   Examination:   General exam: Chronically ill Respiratory system: Some rhonchi at the bases Cardiovascular system: S1 & S2 heard, RRR. No JVD, murmurs, rubs, gallops or clicks. No pedal edema. Gastrointestinal system: Wound dressing noted Central nervous system: Alert and oriented. No focal neurological deficits. Extremities: Bilateral AKA Skin: No rashes, lesions or ulcers Psychiatry: Judgement and insight appear poor Right upper extremity fistula        Pressure Injury 05/22/2023 Buttocks Left Stage 2 -  Partial thickness loss of dermis presenting as a shallow open injury with a red, pink wound bed without slough. (Active)  05/14/2023 2128  Location: Buttocks  Location Orientation: Left  Staging: Stage 2 -  Partial thickness loss of dermis presenting as a shallow open injury with a red, pink wound bed without slough.  Wound Description (Comments):   Present on Admission: Yes     Pressure Injury 06/02/2023 Sacrum Stage 1 -  Intact skin with non-blanchable redness of a localized area usually over  a bony prominence. Old, healed pressure injury (Active)  05/17/2023 2128  Location: Sacrum  Location Orientation:   Staging: Stage 1 -  Intact skin with non-blanchable redness of a localized area usually over a bony prominence.  Wound  Description (Comments): Old, healed pressure injury  Present on Admission: Yes     Diet Orders (From admission, onward)     Start     Ordered   06/05/23 1144  Diet regular Room service appropriate? Yes; Fluid consistency: Thin  Diet effective now       Question Answer Comment  Room service appropriate? Yes   Fluid consistency: Thin      06/05/23 1143            Objective: Vitals:   06/10/23 2036 06/11/23 0117 06/11/23 0652 06/11/23 0759  BP: (!) 95/53 (!) 90/52 (!) 81/61 100/60  Pulse: 78 70 82 80  Resp: 18 18 18 17   Temp: 98 F (36.7 C) 98 F (36.7 C) 98.6 F (37 C) 98.1 F (36.7 C)  TempSrc: Oral Oral Oral Oral  SpO2: 98% 98% 100% 95%  Weight:        Intake/Output Summary (Last 24 hours) at 06/11/2023 1100 Last data filed at 06/11/2023 0900 Gross per 24 hour  Intake 240 ml  Output 100 ml  Net 140 ml   Filed Weights   06/10/23 0542 06/10/23 1441 06/10/23 1913  Weight: 42.5 kg 44 kg 43.5 kg    Scheduled Meds:  aspirin EC  81 mg Oral Daily   atorvastatin  20 mg Oral Daily   Chlorhexidine Gluconate Cloth  6 each Topical Q0600   darbepoetin (ARANESP) injection - DIALYSIS  60 mcg Subcutaneous Q Fri-1800   dorzolamide-timolol  1 drop Left Eye BID   dronabinol  5 mg Oral BID AC   feeding supplement (NEPRO CARB STEADY)  237 mL Oral BID BM   heparin  5,000 Units Subcutaneous Q8H   lidocaine  1 patch Transdermal Q24H   LORazepam  1 mg Intravenous Once   midodrine  10 mg Oral TID WC   Continuous Infusions:  Nutritional status     Body mass index is 18.14 kg/m.  Data Reviewed:   CBC: Recent Labs  Lab 06/07/23 0911 06/08/23 0411 06/09/23 0955 06/10/23 1448 06/11/23 0711  WBC 11.0* 9.0 9.7 10.1 9.8  HGB 10.8* 11.2* 11.7* 11.5* 12.8  HCT 35.0* 37.3 37.9 37.3 42.7  MCV 96.2 94.9 94.0 95.4 94.9  PLT 255 249 240 272 224   Basic Metabolic Panel: Recent Labs  Lab 06/06/23 0130 06/07/23 0904 06/07/23 0911 06/08/23 0411 06/09/23 0955  06/10/23 1448 06/11/23 0711  NA 138  --  139 138 139 138  --   K 4.3  --  4.6 4.7 5.1 4.5  --   CL 99  --  97* 96* 99 99  --   CO2 24  --  23 25 25 26   --   GLUCOSE 231*  --  135* 281* 156* 105*  --   BUN 33*  --  24* 32* 39* 42*  --   CREATININE 5.63*  --  4.73* 5.90* 7.18* 8.37*  --   CALCIUM 9.1  --  10.1 10.0 9.6 9.3  --   MG 1.8  --  1.8 1.8 1.7 1.8 1.5*  PHOS  --  5.3*  --   --   --  6.4*  --    GFR: Estimated Creatinine Clearance: 6.1 mL/min (A) (by C-G formula based on  SCr of 8.37 mg/dL (H)). Liver Function Tests: Recent Labs  Lab 06/06/23 0130 06/07/23 0911 06/08/23 0411 06/09/23 0955 06/10/23 1448  AST 15 19 15 16 15   ALT 17 16 16 14 14   ALKPHOS 69 76 84 86 83  BILITOT 0.9 1.0 0.9 0.6 0.8  PROT 6.1* 7.2 7.0 7.0 6.8  ALBUMIN 1.8* 2.9* 2.4* 2.2* 2.1*   No results for input(s): "LIPASE", "AMYLASE" in the last 168 hours. No results for input(s): "AMMONIA" in the last 168 hours. Coagulation Profile: No results for input(s): "INR", "PROTIME" in the last 168 hours. Cardiac Enzymes: No results for input(s): "CKTOTAL", "CKMB", "CKMBINDEX", "TROPONINI" in the last 168 hours. BNP (last 3 results) No results for input(s): "PROBNP" in the last 8760 hours. HbA1C: No results for input(s): "HGBA1C" in the last 72 hours. CBG: Recent Labs  Lab 06/10/23 0016 06/10/23 0804 06/10/23 1144 06/11/23 0035 06/11/23 0757  GLUCAP 115* 105* 128* 133* 143*   Lipid Profile: No results for input(s): "CHOL", "HDL", "LDLCALC", "TRIG", "CHOLHDL", "LDLDIRECT" in the last 72 hours. Thyroid Function Tests: No results for input(s): "TSH", "T4TOTAL", "FREET4", "T3FREE", "THYROIDAB" in the last 72 hours. Anemia Panel: No results for input(s): "VITAMINB12", "FOLATE", "FERRITIN", "TIBC", "IRON", "RETICCTPCT" in the last 72 hours. Sepsis Labs: No results for input(s): "PROCALCITON", "LATICACIDVEN" in the last 168 hours.  No results found for this or any previous visit (from the past 240  hour(s)).       Radiology Studies: No results found.         LOS: 15 days   Time spent= 35 mins    Miguel Rota, MD [facility]  If 7PM-7AM, please contact night-coverage  06/11/2023, 11:00 AM

## 2023-06-11 NOTE — Progress Notes (Signed)
IV team consult placed for PIV access. Upon arrival to room, patient stated that she did not want the IV done right now. RN notified and asked to reorder consult when patient is ready.

## 2023-06-11 NOTE — TOC Progression Note (Signed)
Transition of Care [facility]) - Progression Note    Patient Details  Name: [name] MRN: [id number] Date of Birth: 10-31-1980  Transition of Care [facility] CM/SW Contact  Carley Hammed, LCSW Phone Number: 06/11/2023, 12:40 PM  Clinical Narrative:    CSW continues to follow pt for medical clearance. Pt  from [facility] and can return at discharge. TOC will continue to follow.      Barriers to Discharge: Continued Medical Work up  Expected Discharge Plan and Services     Post Acute Care Choice: Skilled Nursing Facility Living arrangements for the past 2 months: Skilled Nursing Facility                                       Social Determinants of Health (SDOH) Interventions SDOH Screenings   Food Insecurity: No Food Insecurity (06/07/2023)  Housing: Patient Unable To Answer (06/07/2023)  Transportation Needs: No Transportation Needs (06/11/2023)  Utilities: Not At Risk (06/11/2023)  Depression (PHQ2-9): Low Risk  (05/11/2020)  Tobacco Use: Low Risk  (06/07/2023)    Readmission Risk Interventions    07/04/2022   10:49 AM 08/23/2021    3:42 PM  Readmission Risk Prevention Plan  Transportation Screening Complete Complete  HRI or Home Care Consult  Complete  Social Work Consult for Recovery Care Planning/Counseling  Complete  Palliative Care Screening  Not Applicable  Medication Review Oceanographer) Complete Complete  PCP or Specialist appointment within 3-5 days of discharge Complete   HRI or Home Care Consult Complete   SW Recovery Care/Counseling Consult Complete   Palliative Care Screening Not Applicable   Skilled Nursing Facility Complete

## 2023-06-12 DIAGNOSIS — I959 Hypotension, unspecified: Secondary | ICD-10-CM | POA: Diagnosis not present

## 2023-06-12 DIAGNOSIS — Z711 Person with feared health complaint in whom no diagnosis is made: Secondary | ICD-10-CM

## 2023-06-12 DIAGNOSIS — Z515 Encounter for palliative care: Secondary | ICD-10-CM | POA: Diagnosis not present

## 2023-06-12 DIAGNOSIS — A419 Sepsis, unspecified organism: Secondary | ICD-10-CM | POA: Diagnosis not present

## 2023-06-12 DIAGNOSIS — R6521 Severe sepsis with septic shock: Secondary | ICD-10-CM | POA: Diagnosis not present

## 2023-06-12 DIAGNOSIS — N186 End stage renal disease: Secondary | ICD-10-CM | POA: Diagnosis not present

## 2023-06-12 DIAGNOSIS — Z789 Other specified health status: Secondary | ICD-10-CM

## 2023-06-12 LAB — GLUCOSE, CAPILLARY
Glucose-Capillary: 154 mg/dL — ABNORMAL HIGH (ref 70–99)
Glucose-Capillary: 163 mg/dL — ABNORMAL HIGH (ref 70–99)
Glucose-Capillary: 235 mg/dL — ABNORMAL HIGH (ref 70–99)
Glucose-Capillary: 249 mg/dL — ABNORMAL HIGH (ref 70–99)

## 2023-06-12 LAB — RENAL FUNCTION PANEL
Albumin: 2.1 g/dL — ABNORMAL LOW (ref 3.5–5.0)
Anion gap: 17 — ABNORMAL HIGH (ref 5–15)
BUN: 27 mg/dL — ABNORMAL HIGH (ref 6–20)
CO2: 24 mmol/L (ref 22–32)
Calcium: 10 mg/dL (ref 8.9–10.3)
Chloride: 97 mmol/L — ABNORMAL LOW (ref 98–111)
Creatinine, Ser: 5.47 mg/dL — ABNORMAL HIGH (ref 0.44–1.00)
GFR, Estimated: 9 mL/min — ABNORMAL LOW (ref >60)
Glucose, Bld: 253 mg/dL — ABNORMAL HIGH (ref 70–99)
Phosphorus: 5.3 mg/dL — ABNORMAL HIGH (ref 2.5–4.6)
Potassium: 3.8 mmol/L (ref 3.5–5.1)
Sodium: 138 mmol/L (ref 135–145)

## 2023-06-12 LAB — MAGNESIUM: Magnesium: 2 mg/dL (ref 1.7–2.4)

## 2023-06-12 MED ORDER — LIDOCAINE HCL (PF) 1 % IJ SOLN: 5.0000 mL | INTRAMUSCULAR | Status: DC | PRN

## 2023-06-12 MED ORDER — ALTEPLASE 2 MG IJ SOLR: 2.0000 mg | Freq: Once | INTRAMUSCULAR | Status: DC | PRN

## 2023-06-12 MED ORDER — INSULIN ASPART 100 UNIT/ML IJ SOLN
0.0000 [IU] | Freq: Three times a day (TID) | INTRAMUSCULAR | Status: DC
  Administered 2023-06-12 – 2023-06-14 (×4): 1 [IU] via SUBCUTANEOUS
  Administered 2023-06-15: 3 [IU] via SUBCUTANEOUS
  Administered 2023-06-15 – 2023-06-17 (×3): 2 [IU] via SUBCUTANEOUS

## 2023-06-12 MED ORDER — COLLAGENASE 250 UNIT/GM EX OINT
TOPICAL_OINTMENT | Freq: Two times a day (BID) | CUTANEOUS | Status: DC
  Administered 2023-06-14: 1 via TOPICAL
  Filled 2023-06-12 (×2): qty 30

## 2023-06-12 MED ORDER — HALOPERIDOL LACTATE 5 MG/ML IJ SOLN: 1.0000 mg | Freq: Once | INTRAMUSCULAR | Status: DC

## 2023-06-12 MED ORDER — LIDOCAINE-PRILOCAINE 2.5-2.5 % EX CREA: 1.0000 | TOPICAL_CREAM | CUTANEOUS | Status: DC | PRN

## 2023-06-12 MED ORDER — HYDROCODONE-ACETAMINOPHEN 5-325 MG PO TABS: 1.0000 | ORAL_TABLET | Freq: Four times a day (QID) | ORAL | 0 refills | Status: DC | PRN

## 2023-06-12 MED ORDER — PENTAFLUOROPROP-TETRAFLUOROETH EX AERO: 1.0000 | INHALATION_SPRAY | CUTANEOUS | Status: DC | PRN

## 2023-06-12 MED ORDER — ASPIRIN 81 MG PO TBEC: 81.0000 mg | DELAYED_RELEASE_TABLET | Freq: Every day | ORAL | Status: DC

## 2023-06-12 MED ORDER — HEPARIN SODIUM (PORCINE) 1000 UNIT/ML DIALYSIS: 1000.0000 [IU] | INTRAMUSCULAR | Status: DC | PRN

## 2023-06-12 MED ORDER — ALBUMIN HUMAN 25 % IV SOLN
25.0000 g | Freq: Once | INTRAVENOUS | Status: AC
  Administered 2023-06-12: 25 g via INTRAVENOUS

## 2023-06-12 MED ORDER — DARBEPOETIN ALFA 60 MCG/0.3ML IJ SOSY: 60.0000 ug | PREFILLED_SYRINGE | INTRAMUSCULAR | Status: DC

## 2023-06-12 NOTE — Progress Notes (Signed)
PROGRESS NOTE    [name]  WCB:[id number] DOB: 02/13/1981 DOA: 05/16/2023 PCP: [facility]     Brief Narrative:  42 year old with history of DM2, ESRD, bilateral AKA, blindness, PAD initially presented with hypotension from dialysis.  Had a recent prolonged stay in the hospital in September 2024 for small bowel ischemia complicated by SBO and had extended ileocecectomy and discharged to SNF.  Patient has had HD catheter in the right chest wall due to difficulty accessing her AV fistula.  Eventually at SNF found to be also hypotensive during her HD therefore admitted to the ICU and PCCM was initially consulted.  Eventually she was weaned off pressors on 10/16 and transferred to [facility].  During this time it has been very difficult to get in touch with patient's [facility] POA, Sister [name] to get any formal consents.  Eventually ethics was consulted and stated next point of contact should be her husband.  Assessment & Plan:   Principal Problem:   Septic shock (HCC)   Goals of care discussion  Difficulty getting touch with patient's healthcare power of attorney, [name] sister.  Ethics was consulted. Ok to contact "next of kin" who is her husband. Palliative is having on going discussions with him.   New onset congestive heart failure with reduced EF 25% -Echocardiogram 2023 had preserved EF, echo done on 05/29/2023 showed EF of 25%.  New change, seen by cardiology but due to multiple comorbidities, continue current management.  No further intervention planned.   End-stage renal disease, likely diabetic nephropathy HD per nephrology.  AVF versus use of HD catheter per nephrology Status post IR right arm fistulogram with balloon angioplasty Occasionally refuses HD, last session 11/28.   Septic Shock secondary to suspected wound/skin/soft tissue infection, POA Acute metabolic encephalopathy Patient has completed all antibiotic course at this point.  Currently on  midodrine 10 mg 3 times daily. Albumin prn.    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  LDL 19, A1c 5.7.   DVT prophylaxis: heparin injection 5,000 Units Start: 05/21/2023 2200 Code Status:   Code Status: Full Code Family Communication: None at bedside today Continue hospital stay until HD access has been addressed.     Subjective: Seen in HD no complaints.   Examination:   General exam: Chronically ill Respiratory system: Some rhonchi at the bases Cardiovascular system: S1 & S2 heard, RRR. No JVD, murmurs, rubs, gallops or clicks. No pedal edema. Gastrointestinal system: Wound dressing noted Central nervous system: Alert and oriented. No focal neurological deficits. Extremities: Bilateral AKA Skin: No rashes, lesions or ulcers Psychiatry: Judgement and insight appear poor Right upper extremity fistula            Pressure Injury 06/07/2023 Buttocks Left Stage 2 -  Partial thickness loss of dermis presenting as a shallow open injury with a red, pink wound bed without slough. (Active)  06/07/2023 2128  Location: Buttocks  Location Orientation: Left  Staging: Stage 2 -  Partial thickness loss of dermis presenting as a shallow open injury with a red, pink wound bed without slough.  Wound Description (Comments):   Present on Admission: Yes     Pressure Injury 06/10/2023 Sacrum Stage 1 -  Intact skin with non-blanchable redness of a localized area usually over a bony prominence. Old, healed pressure injury (Active)  06/12/2023 2128  Location: Sacrum  Location Orientation:   Staging: Stage 1 -  Intact skin with non-blanchable redness of a localized area usually over a bony prominence.  Wound Description (Comments): Old, healed pressure injury  Present on Admission: Yes      Diet Orders (From admission, onward)     Start     Ordered   06/05/23 1144  Diet regular Room service appropriate? Yes; Fluid consistency: Thin  Diet effective now       Question Answer Comment  Room service appropriate? Yes   Fluid consistency: Thin      06/05/23 1143            Objective: Vitals:   06/12/23 1030 06/12/23 1036 06/12/23 1100 06/12/23 1130  BP: (!) 78/56 (!) 97/59 103/67 111/71  Pulse:   77 77  Resp: (!) 8 (!) 8 16 10   Temp:      TempSrc:      SpO2: 95% 95%    Weight:        Intake/Output Summary (Last 24 hours) at 06/12/2023 1147 Last data filed at 06/11/2023 1751 Gross per 24 hour  Intake 528.62 ml  Output --  Net 528.62 ml   Filed Weights   06/10/23 1913 06/12/23 0500 06/12/23 0824  Weight: 43.5 kg 44 kg 42.2 kg    Scheduled Meds:  aspirin EC  81 mg Oral Daily   atorvastatin  20 mg Oral Daily   Chlorhexidine Gluconate Cloth  6 each Topical Q0600   darbepoetin (ARANESP) injection - DIALYSIS  60 mcg Subcutaneous Q Fri-1800   dorzolamide-timolol  1 drop Left Eye BID   dronabinol  5 mg Oral BID AC   feeding supplement (NEPRO CARB STEADY)  237 mL Oral BID BM   haloperidol lactate  1 mg Intravenous Once   heparin  5,000 Units Subcutaneous Q8H   lidocaine  1 patch Transdermal Q24H   midodrine  10 mg Oral TID WC   Continuous Infusions:  Nutritional status     Body mass index is 17.57 kg/m.  Data Reviewed:   CBC: Recent Labs  Lab 06/07/23 0911 06/08/23 0411 06/09/23 0955 06/10/23 1448 06/11/23 0711  WBC 11.0* 9.0 9.7 10.1 9.8  HGB 10.8* 11.2* 11.7* 11.5* 12.8  HCT 35.0* 37.3 37.9 37.3 42.7  MCV 96.2 94.9 94.0 95.4 94.9  PLT 255 249 240 272 224   Basic Metabolic Panel: Recent Labs  Lab 06/07/23 0904 06/07/23 0911 06/08/23 0411 06/09/23 0955 06/10/23 1448 06/11/23 0711 06/12/23 0827  NA  --  139 138 139 138  --  138  K  --  4.6 4.7 5.1 4.5  --  3.8  CL  --  97* 96* 99 99  --  97*  CO2  --  23 25 25 26   --  24   GLUCOSE  --  135* 281* 156* 105*  --  253*  BUN  --  24* 32* 39* 42*  --  27*  CREATININE  --  4.73* 5.90* 7.18* 8.37*  --  5.47*  CALCIUM  --  10.1 10.0 9.6 9.3  --  10.0  MG  --  1.8 1.8 1.7 1.8 1.5* 2.0  PHOS 5.3*  --   --   --  6.4*  --  5.3*   GFR: Estimated Creatinine Clearance: 9 mL/min (A) (by C-G formula based on SCr of 5.47 mg/dL (H)). Liver Function Tests: Recent Labs  Lab 06/06/23 0130 06/07/23 0911 06/08/23 0411 06/09/23 0955 06/10/23 1448  06/12/23 0827  AST 15 19 15 16 15   --   ALT 17 16 16 14 14   --   ALKPHOS 69 76 84 86 83  --   BILITOT 0.9 1.0 0.9 0.6 0.8  --   PROT 6.1* 7.2 7.0 7.0 6.8  --   ALBUMIN 1.8* 2.9* 2.4* 2.2* 2.1* 2.1*   No results for input(s): "LIPASE", "AMYLASE" in the last 168 hours. No results for input(s): "AMMONIA" in the last 168 hours. Coagulation Profile: No results for input(s): "INR", "PROTIME" in the last 168 hours. Cardiac Enzymes: No results for input(s): "CKTOTAL", "CKMB", "CKMBINDEX", "TROPONINI" in the last 168 hours. BNP (last 3 results) No results for input(s): "PROBNP" in the last 8760 hours. HbA1C: No results for input(s): "HGBA1C" in the last 72 hours. CBG: Recent Labs  Lab 06/11/23 0035 06/11/23 0757 06/11/23 1551 06/12/23 0025 06/12/23 0809  GLUCAP 133* 143* 191* 235* 249*   Lipid Profile: No results for input(s): "CHOL", "HDL", "LDLCALC", "TRIG", "CHOLHDL", "LDLDIRECT" in the last 72 hours. Thyroid Function Tests: No results for input(s): "TSH", "T4TOTAL", "FREET4", "T3FREE", "THYROIDAB" in the last 72 hours. Anemia Panel: No results for input(s): "VITAMINB12", "FOLATE", "FERRITIN", "TIBC", "IRON", "RETICCTPCT" in the last 72 hours. Sepsis Labs: No results for input(s): "PROCALCITON", "LATICACIDVEN" in the last 168 hours.  No results found for this or any previous visit (from the past 240 hour(s)).       Radiology Studies: No results found.         LOS: 16 days   Time spent= 35  mins    Miguel Rota, MD [facility]  If 7PM-7AM, please contact night-coverage  06/12/2023, 11:47 AM

## 2023-06-12 NOTE — Plan of Care (Signed)
  Problem: Education: Goal: Ability to verbalize activity precautions or restrictions will improve Outcome: Progressing Goal: Understanding of discharge needs will improve Outcome: Progressing   Problem: Clinical Measurements: Goal: Postoperative complications will be avoided or minimized Outcome: Progressing   Problem: Skin Integrity: Goal: Demonstration of wound healing without infection will improve Outcome: Progressing   Problem: Activity: Goal: Risk for activity intolerance will decrease Outcome: Progressing   Problem: Elimination: Goal: Will not experience complications related to bowel motility Outcome: Progressing   Problem: Coping: Goal: Level of anxiety will decrease Outcome: Progressing   Problem: Skin Integrity: Goal: Risk for impaired skin integrity will decrease Outcome: Progressing   Problem: Coping: Goal: Ability to adjust to condition or change in health will improve Outcome: Progressing
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
## 2023-06-12 NOTE — Progress Notes (Signed)
Received patient in bed to unit.  Alert and oriented.  Informed consent signed and in chart.   TX duration:3.5.Marland KitchenMarland Kitchenblood pressure during session was low.  PA Anderson made aware, Albumin given and ultrafiltration kept to even per PA Anderson.  Patient tolerated well.  Transported back to the room  Alert, without acute distress.  Hand-off given to patient's nurse.   Access used: R Upper Arm Fistula Access issues: none  Total UF removed: - Medication(s) given: midodrine and Albumin   06/12/23 1257  Vitals  Temp 97.6 F (36.4 C)  Temp Source Oral  BP 116/71  MAP (mmHg) 86  ECG Heart Rate 79  Resp 12  Oxygen Therapy  SpO2 95 %  O2 Device Room Air  Patient Activity (if Appropriate) In bed     Stacie Glaze LPN [facility]

## 2023-06-12 NOTE — Progress Notes (Signed)
Contacted by nephrologist and renal NP regarding pt's d/c today and resumption of out-pt HD on Friday. Staff requested that clinic be contacted to confirm that pt's schedule will not change at d/c. Contacted [facility] and spoke to Consulting civil engineer. Navigator advised that clinic medical director has concerns about pt being out-pt HD appropriate. Clinic asking if medical staff feel pt is able to tolerate HD in chair. Above information provided to attending, nephrologist, renal NP, and CSW. Nephrologist confirms that pt will need to trial HD in chair prior to d/c to confirm pt is able to tolerate and appropriate for out-pt HD. Contacted charge RN at clinic to make aware of this information. Will need to confirm medical director of clinic willing to accept pt back if pt able to tolerate HD in chair. Will assist as needed.   Olivia Canter Renal Navigator [phone]

## 2023-06-12 NOTE — Consult Note (Signed)
WOC Nurse wound follow up; visit made with B. Meuth, PA CCS  Wound type: full thickness surgical  Measurement: 14 cm x 5 cm x 2 cm  Wound bed: 60% tan fibrinous 40% pink; has black eschar at 3-5 o'clock and between wound and ostomy  Drainage (amount, consistency, odor) minimal tan  Periwound: eschar as above, ileostomy to R Dressing procedure/placement/frequency: DC Dakin's.  Clean wound with NS, apply Santyl to wound bed twice daily, fill in wound bed with saline moistened gauze, cover with dry gauze and ABD pad.   WOC Nurse ostomy follow up Stoma type/location: ileostomy RMQ  Stomal assessment/size: 1 1/4" , red moist, flush with skin level Peristomal assessment:  necrotic skin present but appears to be diminishing hard brown necrotic tissue, a thin layer of slough at distal aspect; some red moist tissue from 5-7 o'clock  Treatment options for stomal/peristomal skin: applied silver hydrofiber to red moist tissue lower edge of ileostomy, covered with 2" barrier ring. Crusted with stoma powder and no sting barrier wipe.   Output approximately 400 mls green effluent  between pouch and suction cannister  Ostomy pouching: small [place] [phone])  Education provided: none, patient is total care for her ostomy and wound.  Enrolled patient in [place] Secure Start Discharge program: no, resides at rehab facility   (3) small Eakin pouches, barrier rings, stoma powder and no sting barrier wipes in room for changing if needed. Did not place back to suction as effluent has thickened. Attached pouch to a bedside drainage bag.   WOC team will continue to follow weekly for ostomy support.   Thank you,    Priscella Mann MSN, RN-BC, [employer] [phone]

## 2023-06-12 NOTE — Progress Notes (Addendum)
[facility] Progress Note   Subjective:    Seen and examined patient on HD. Informed of ongoing hypotension. She is responsive and on Midodrine. She denies dizziness, SOB, CP, and N/V. On O2. Will give an extra Midodrine dose mid-run if needed and also ordered IV Albumin. UFG set .  Objective Vitals:   06/12/23 1000 06/12/23 1030 06/12/23 1036 06/12/23 1100  BP: 96/66 (!) 78/56 (!) 97/59 103/67  Pulse: 70   77  Resp: 10 (!) 8 (!) 8 16  Temp:      TempSrc:      SpO2: 95% 95% 95%   Weight:       Physical Exam General: Chronically ill appearing woman, NAD. Room air. Heart: RRR; no murmur Lungs: CTA anteriorly Abdomen: dressings/wound vac in place Extremities: B AKA; no RUE edema Dialysis Access: RUE AVF + bruit  Filed Weights   06/10/23 1913 06/12/23 0500 06/12/23 0824  Weight: 43.5 kg 44 kg 42.2 kg    Intake/Output Summary (Last 24 hours) at 06/12/2023 1104 Last data filed at 06/11/2023 1751 Gross per 24 hour  Intake 528.62 ml  Output --  Net 528.62 ml    Additional Objective Labs: Basic Metabolic Panel: Recent Labs  Lab 06/07/23 0904 06/07/23 0911 06/09/23 0955 06/10/23 1448 06/12/23 0827  NA  --    < > 139 138 138  K  --    < > 5.1 4.5 3.8  CL  --    < > 99 99 97*  CO2  --    < > 25 26 24   GLUCOSE  --    < > 156* 105* 253*  BUN  --    < > 39* 42* 27*  CREATININE  --    < > 7.18* 8.37* 5.47*  CALCIUM  --    < > 9.6 9.3 10.0  PHOS 5.3*  --   --  6.4* 5.3*   < > = values in this interval not displayed.   Liver Function Tests: Recent Labs  Lab 06/08/23 0411 06/09/23 0955 06/10/23 1448 06/12/23 0827  AST 15 16 15   --   ALT 16 14 14   --   ALKPHOS 84 86 83  --   BILITOT 0.9 0.6 0.8  --   PROT 7.0 7.0 6.8  --   ALBUMIN 2.4* 2.2* 2.1* 2.1*   No results for input(s): "LIPASE", "AMYLASE" in the last 168 hours. CBC: Recent Labs  Lab 06/07/23 0911 06/08/23 0411 06/09/23 0955 06/10/23 1448 06/11/23 0711  WBC 11.0* 9.0 9.7 10.1  9.8  HGB 10.8* 11.2* 11.7* 11.5* 12.8  HCT 35.0* 37.3 37.9 37.3 42.7  MCV 96.2 94.9 94.0 95.4 94.9  PLT 255 249 240 272 224   Blood Culture    Component Value Date/Time   SDES BLOOD LEFT ARM 05/27/2023 1527   SPECREQUEST  05/27/2023 1527    BOTTLES DRAWN AEROBIC AND ANAEROBIC Blood Culture adequate volume   CULT  05/27/2023 1527    NO GROWTH 5 DAYS Performed at [facility], [address], [place], [place] [address]    REPTSTATUS 06/01/2023 FINAL 05/27/2023 1527    Cardiac Enzymes: No results for input(s): "CKTOTAL", "CKMB", "CKMBINDEX", "TROPONINI" in the last 168 hours. CBG: Recent Labs  Lab 06/11/23 0035 06/11/23 0757 06/11/23 1551 06/12/23 0025 06/12/23 0809  GLUCAP 133* 143* 191* 235* 249*   Iron Studies: No results for input(s): "IRON", "TIBC", "TRANSFERRIN", "FERRITIN" in the last 72 hours. Lab Results  Component Value Date  INR 1.9 (H) 05/14/2023   INR 1.3 (H) 04/30/2023   INR 1.3 (H) 06/28/2022   Studies/Results: No results found.  Medications:   aspirin EC  81 mg Oral Daily   atorvastatin  20 mg Oral Daily   Chlorhexidine Gluconate Cloth  6 each Topical Q0600   darbepoetin (ARANESP) injection - DIALYSIS  60 mcg Subcutaneous Q Fri-1800   dorzolamide-timolol  1 drop Left Eye BID   dronabinol  5 mg Oral BID AC   feeding supplement (NEPRO CARB STEADY)  237 mL Oral BID BM   haloperidol lactate  1 mg Intravenous Once   heparin  5,000 Units Subcutaneous Q8H   lidocaine  1 patch Transdermal Q24H   midodrine  10 mg Oral TID WC    Dialysis Orders: [facility] MWF  400/1.5 auto, 2K/2Ca, EDW 49kg, mircera 75 q2wks   Assessment/Plan: 1. Shock: presumed septic, now off pressors, on midodrine. Suspected related to abd wound.  S/p abx, per primary 2. 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 Necrotic tissue noted in wound and around ileostomy.  WC and surgery following.   3. 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  CTA completed.  EEG with no seizure like activity. Per Neuro.  4. ESRD - MWF schedule dialysis on schedule. S/p fgram 10/23 with IR: multiple areas of narrowing, s/p balloon angioplasty. Refused HD 10/25 and 10/26 but has agreed to treatments on 10/28 and again today. 5. HTN/volume - Hypotensive, on midodrine. Also ordered IV Albumin 25g with HD today for additional BP support. Okay to give an extra Midodrine dose mid-run if needed. Volume status improved - WELL below prior EDW, will adjust at dc. 6. Anemia of CKD- Hgb now 12.8 - continue Aranesp q Fri for now but will stop if Hgb continues to trend up. 7. Secondary hyperparathyroidism - CorrCa high. Binders on hold. Senispar on hold d/t GI issues. Not on VDRA.  Repeat PTH and Phos. 8. Nutrition - Alb 2.4, continue supps. 9. Dispo - Patient has been agreeing to proceed with HD for the past 2 treatments; however, she's been c/o ongoing pain and receiving HD in the bed so far this week. We will need to see how she tolerates HD in the chair to ensure she is appropriate to receive HD in the outpatient setting. Palliative is on board and noted they been having difficulty contacting [name] (patient's sister) who is the HPOA. This is a difficult situation but we will try getting her in the chair at her next HD session on 06/14/23. If she does not tolerate HD in the chair, the next options are LTAC vs hospice.   Salome Holmes, NP [facility] 06/12/2023,11:04 AM  LOS: 16 days

## 2023-06-12 NOTE — Progress Notes (Signed)
Patient ID: [name], female   DOB: 30-Dec-1980, 42 y.o.   MRN: [id number] [facility] Surgery Progress Note     Subjective: CC-  Seen with WOC RN today. Continues to have discomfort with dressing changes. Tolerating diet. Unsure how much ileostomy output she is having. There is about 300cc in the pouch/canister, not sure when this was last changed.  Objective: Vital signs in last 24 hours: Temp:  [97.6 F (36.4 C)-98.1 F (36.7 C)] 97.6 F (36.4 C) (10/30 1257) Pulse Rate:  [70-81] 80 (10/30 1230) Resp:  [8-17] 17 (10/30 1302) BP: (73-131)/(52-78) 131/71 (10/30 1302) SpO2:  [92 %-95 %] 95 % (10/30 1302) Weight:  [42.2 kg-44 kg] 42.2 kg (10/30 1303) Last BM Date : 06/11/23  Intake/Output from previous day: 10/29 0701 - 10/30 0700 In: 768.6 [P.O.:720; IV Piggyback:48.6] Out: -  Intake/Output this shift: Total I/O In: -  Out: -100   PE: Gen: Alert, intermittently tearful and complaining of pain Pulm: rate and effort normal Abd: soft, nondistended, tender over incision and around ostomy, Midline wound as below: fat necroses of central portion of wound with interval increase in viable pink tissue. There is a layer of fribinous exudate over fascial sutures. Stoma viable with surrounding skin breakdown. Ileostomy pouch with semisolid stool   Lab Results:  Recent Labs    06/10/23 1448 06/11/23 0711  WBC 10.1 9.8  HGB 11.5* 12.8  HCT 37.3 42.7  PLT 272 224   BMET Recent Labs    06/10/23 1448 06/12/23 0827  NA 138 138  K 4.5 3.8  CL 99 97*  CO2 26 24  GLUCOSE 105* 253*  BUN 42* 27*  CREATININE 8.37* 5.47*  CALCIUM 9.3 10.0   PT/INR No results for input(s): "LABPROT", "INR" in the last 72 hours. CMP     Component Value Date/Time   NA 138 06/12/2023 0827   NA 133 (L) 05/18/2020 0927   K 3.8 06/12/2023 0827   CL 97 (L) 06/12/2023 0827   CO2 24 06/12/2023 0827   GLUCOSE 253 (H) 06/12/2023 0827   BUN 27 (H) 06/12/2023 0827   BUN 45 (H) 05/18/2020 0927    CREATININE 5.47 (H) 06/12/2023 0827   CALCIUM 10.0 06/12/2023 0827   CALCIUM 12.1 (H) 11/13/2021 2026   PROT 6.8 06/10/2023 1448   PROT 7.6 05/18/2020 0927   ALBUMIN 2.1 (L) 06/12/2023 0827   ALBUMIN 3.9 05/18/2020 0927   AST 15 06/10/2023 1448   ALT 14 06/10/2023 1448   ALKPHOS 83 06/10/2023 1448   BILITOT 0.8 06/10/2023 1448   BILITOT <0.2 05/18/2020 0927   GFRNONAA 9 (L) 06/12/2023 0827   GFRAA 6 (L) 05/18/2020 0927   Lipase     Component Value Date/Time   LIPASE 55 (H) 05/25/2023 1159       Studies/Results: No results found.  Anti-infectives: Anti-infectives (From admission, onward)    Start     Dose/Rate Route Frequency Ordered Stop   05/31/23 2000  vancomycin (VANCOREADY) IVPB 500 mg/100 mL        500 mg 100 mL/hr over 60 Minutes Intravenous Once per day on Monday Wednesday Friday 05/31/23 1133 06/03/23 1706   05/29/23 1200  vancomycin (VANCOREADY) IVPB 500 mg/100 mL  Status:  Discontinued        500 mg 100 mL/hr over 60 Minutes Intravenous Every M-W-F (Hemodialysis) 05/28/23 0924 05/31/23 1133   05/28/23 1500  ceFEPIme (MAXIPIME) 2 g in sodium chloride 0.9 % 100 mL IVPB  Status:  Discontinued  2 g 200 mL/hr over 30 Minutes Intravenous Every 24 hours 05/28/23 0921 05/28/23 0922   05/28/23 1500  ceFEPIme (MAXIPIME) 1 g in sodium chloride 0.9 % 100 mL IVPB        1 g 200 mL/hr over 30 Minutes Intravenous Every 24 hours 05/28/23 0922 06/02/23 1716   05/28/23 0600  metroNIDAZOLE (FLAGYL) IVPB 500 mg  Status:  Discontinued        500 mg 100 mL/hr over 60 Minutes Intravenous Every 12 hours 05/28/23 0104 05/29/23 0847   05/25/2023 1600  vancomycin (VANCOCIN) IVPB 1000 mg/200 mL premix        1,000 mg 200 mL/hr over 60 Minutes Intravenous  Once 05/19/2023 1508 05/30/2023 1933   06/07/2023 1515  ceFEPIme (MAXIPIME) 1 g in sodium chloride 0.9 % 100 mL IVPB        1 g 200 mL/hr over 30 Minutes Intravenous  Once 05/15/2023 1508 06/07/2023 1558   05/24/2023 1515  metroNIDAZOLE  (FLAGYL) IVPB 500 mg        500 mg 100 mL/hr over 60 Minutes Intravenous  Once 05/30/2023 1508 06/12/2023 1933        Assessment/Plan Small bowel ischemia with SBO  POD#39 s/p diagnostic lap converted to ex-lap with extended ileo-cecectomy (mid-ileum to just past cecum), 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 Hillery Hunter Necrotic tissue at midline wound and around ileostomy - appreciate WOC assistance - Eakin's to be re-pouched today - d/c dakins. Add santyl to areas of necrotic tissue on midline, then moist-to-dry dressing changes BID - discussed with RN importance of documenting ileostomy I&Os on this patient. Not currently on any antimotility agents - appreciate palliative medicine assistance. Continue full scope treatment for now, but they have been unable to reach sister/ HCPOA. I spoke with her sister, Dimple Casey, on the phone today. She reports she is typically available every day after 3pm - I will relay this information. She tells me that she does want to continue full scope treatment for her sister.    FEN:  Reg diet ID: Rocephin/Flagyl 9/21 > 9/28 VTE: SCDs, SQH q 8h   - per CCM -  Septic shock - no intraabdominal source of infection on imaging, ?aspiration on CT Possible acute neurologic infarct - neuro following  IDDM ESD on HD Bilateral AKA Blind Severe peripheral arterial disease    LOS: 16 days    Franne Forts, [facility] 06/12/2023, 2:39 PM Please see Amion for pager number during day hours 7:00am-4:30pm

## 2023-06-12 NOTE — Progress Notes (Signed)
Unable to dc patient today as she is still not able to tolerate HD in chair.  Nephro team will try HD in the chair on 11/1

## 2023-06-12 NOTE — TOC Progression Note (Signed)
Transition of Care [facility]) - Progression Note    Patient Details  Name: [name] MRN: [id number] Date of Birth: January 22, 1981  Transition of Care [facility]) CM/SW Contact  Carley Hammed, LCSW Phone Number: 06/12/2023, 12:32 PM  Clinical Narrative:    CSW and medical team discussing possible discharge back to LTC at [facility] Renal team noting pt being unable to sit for HD is a current barrier. Pt to attempt chair HD on 11/1/ Facility notified of delay. TOC will continue to follow.      Barriers to Discharge: Continued Medical Work up  Expected Discharge Plan and Services     Post Acute Care Choice: Skilled Nursing Facility Living arrangements for the past 2 months: Skilled Nursing Facility Expected Discharge Date: 06/12/23                                     Social Determinants of Health (SDOH) Interventions SDOH Screenings   Food Insecurity: No Food Insecurity (06/07/2023)  Housing: Patient Unable To Answer (06/07/2023)  Transportation Needs: No Transportation Needs (06/11/2023)  Utilities: Not At Risk (06/11/2023)  Depression (PHQ2-9): Low Risk  (05/11/2020)  Tobacco Use: Low Risk  (06/07/2023)    Readmission Risk Interventions    07/04/2022   10:49 AM 08/23/2021    3:42 PM  Readmission Risk Prevention Plan  Transportation Screening Complete Complete  HRI or Home Care Consult  Complete  Social Work Consult for Recovery Care Planning/Counseling  Complete  Palliative Care Screening  Not Applicable  Medication Review Oceanographer) Complete Complete  PCP or Specialist appointment within 3-5 days of discharge Complete   HRI or Home Care Consult Complete   SW Recovery Care/Counseling Consult Complete   Palliative Care Screening Not Applicable   Skilled Nursing Facility Complete

## 2023-06-12 NOTE — Discharge Summary (Signed)
subclavian vein were then angioplastied with a 10 mm x 40 mm mustang balloon. Follow-up fistulogram images were obtained. Right innominate vein and right subclavian vein were angioplastied with a 12 mm x 40 mm Mustang balloon and follow-up fistulogram images were obtained. Vascular sheath was removed with a pursestring suture. Bandage was placed. FINDINGS: Right brachiocephalic fistula is patent with an irregular saccular aneurysm involving the mid humeral cephalic vein. These findings are similar to the recent comparison examination. Greater than 50% stenosis involving the cephalic arch. Again noted is narrowing and irregularity involving the right subclavian vein and right innominate vein. Narrowing in the right innominate vein appears to be close to 50%. Again noted are collateral vessels in the right axillary region. SVC is patent. Cephalic vein in the lower humeral region is heavily calcified and the arterial anastomosis is patent. Following balloon angioplasty of the cephalic arch and the right central veins, there is mildly improved flow in both areas. There continues to be mild irregularity and narrowing throughout these areas and persistent filling of the collateral vessels. IMPRESSION: 1. Right upper extremity brachiocephalic fistula is patent. Residual and recurrent areas of narrowing involving the cephalic vein arch and the right  central veins. These areas were treated with balloon angioplasty. Cephalic vein was angioplastied up to 7 mm and the right central veins were angioplastied up to 12 mm. Mildly improved flow in both areas following balloon angioplasty. 2. 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 Findings are similar to the previous examination. It appears this aneurysm is getting accessed during dialysis and likely contributes to the poor flow. Recommend avoiding this area for dialysis access. Consider surgical consultation of this aneurysm. ACCESS: This access remains amenable to future percutaneous interventions as clinically indicated. Electronically Signed   By: Richarda Overlie M.D.   On: 06/05/2023 15:07   ECHOCARDIOGRAM COMPLETE  Result Date: 05/29/2023    ECHOCARDIOGRAM REPORT   Patient Name:   [name] Date of Exam: 05/29/2023 Medical Rec #:  [id number]     Height:       61.0 in Accession #:    [id number]    Weight:       120.6 lb Date of Birth:  January 21, 1981      BSA:          1.523 m Patient Age:    42 years      BP:           123/55 mmHg Patient Gender: F             HR:           70 bpm. Exam Location:  Inpatient Procedure: 2D Echo, Cardiac Doppler and Color Doppler Indications:    Stroke I63.9  History:        Patient has prior history of Echocardiogram examinations, most                 recent 07/24/2022. CAD, PAD and Stroke; Risk                 Factors:Hypertension, Diabetes, Dyslipidemia and Non-Smoker.  Sonographer:    Dondra Prader RVT RCS Referring Phys: [id number] ASHISH ARORA  Sonographer Comments: Technically challenging due to wound, patient position and lack of cooperation. Patient appeared to be agitated. IMPRESSIONS  1. Left ventricular ejection fraction, by estimation, is 25 to 30%. The left ventricle has severely decreased function. The left ventricle demonstrates regional wall motion abnormalities (see scoring diagram/findings for description). There  Physician Discharge Summary  [name] ZOX:[id number] DOB: Aug 01, 1981 DOA: 05/27/2023  PCP: [facility]  Admit date: 05/27/2023 Discharge date: 06/12/2023  Admitted From: LTC Disposition: LT seen  Recommendations for Outpatient Follow-up:  Follow up with PCP in 1-2 weeks Please obtain BMP/CBC in one week your next doctors visit.  Outpatient follow-up with cardiology and vascular as needed. Resume outpatient HD sessions Counseled to remain compliant with her medications.   Discharge Condition: Stable CODE STATUS: Full code Diet recommendation: Diabetic    Brief Narrative:  42 year old with history of DM2, ESRD, bilateral AKA, blindness, PAD initially presented with hypotension from dialysis.  Had a recent prolonged stay in the hospital in September 2024 for small bowel ischemia complicated by SBO and had extended ileocecectomy and discharged to SNF.  Patient has had HD catheter in the right chest wall due to difficulty accessing her AV fistula.  Eventually at SNF found to be also hypotensive during her HD therefore admitted to the ICU and PCCM was initially consulted.  Eventually she was weaned off pressors on 10/16 and transferred to [facility].  Eventually her fistula was evaluated which was functioning adequately per IR.  She tolerated multiple HD sessions without any issues.  For reduced EF cardiology saw the patient and she was deemed to be a poor candidate for any further aggressive workup and recommended outpatient follow-up.  During this time it has been very difficult to get in touch with patient's [facility] POA, Sister [name] to get any formal consents.  Eventually ethics was consulted and stated next point of contact should be her husband.  Assessment & Plan:   Principal Problem:   Septic shock (HCC)   Goals of care discussion  Difficulty getting touch with patient's healthcare power of attorney, [name] sister.  Ethics was consulted. Ok to contact "next of  kin" who is her husband. Palliative is having on going discussions with him.   New onset congestive heart failure with reduced EF 25% -Echocardiogram 2023 had preserved EF, echo done on 05/29/2023 showed EF of 25%.  New change, seen by cardiology but due to multiple comorbidities, continue current management.  No further intervention planned.   End-stage renal disease, likely diabetic nephropathy HD per nephrology.  AVF versus use of HD catheter per nephrology Status post IR right arm fistulogram with balloon angioplasty Occasionally refuses HD, last session 11/28.   Septic Shock secondary to suspected wound/skin/soft tissue infection, POA Acute metabolic encephalopathy Heart stable.  Continue midodrine 10 mg orally daily.  Can slowly attempt to wean this to off.   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  LDL 19, A1c 5.7. Resume home regimen   DVT prophylaxis: heparin injection 5,000 Units Start: 05/27/23 2200 Code Status:   Code Status: Full Code Family Communication: None at bedside today Will discharge today   Subjective: Seen in HD no complaints.   Examination:   General exam: Chronically ill Respiratory system: Some rhonchi at the bases Cardiovascular system: S1 & S2 heard, RRR. No JVD, murmurs, rubs, gallops or clicks. No pedal edema. Gastrointestinal system: Wound dressing noted Central nervous system: Alert and oriented. No focal neurological deficits. Extremities: Bilateral AKA Skin: No rashes, lesions or ulcers Psychiatry: Judgement and insight appear poor Right upper extremity fistula       Discharge Diagnoses:  Principal  subclavian vein were then angioplastied with a 10 mm x 40 mm mustang balloon. Follow-up fistulogram images were obtained. Right innominate vein and right subclavian vein were angioplastied with a 12 mm x 40 mm Mustang balloon and follow-up fistulogram images were obtained. Vascular sheath was removed with a pursestring suture. Bandage was placed. FINDINGS: Right brachiocephalic fistula is patent with an irregular saccular aneurysm involving the mid humeral cephalic vein. These findings are similar to the recent comparison examination. Greater than 50% stenosis involving the cephalic arch. Again noted is narrowing and irregularity involving the right subclavian vein and right innominate vein. Narrowing in the right innominate vein appears to be close to 50%. Again noted are collateral vessels in the right axillary region. SVC is patent. Cephalic vein in the lower humeral region is heavily calcified and the arterial anastomosis is patent. Following balloon angioplasty of the cephalic arch and the right central veins, there is mildly improved flow in both areas. There continues to be mild irregularity and narrowing throughout these areas and persistent filling of the collateral vessels. IMPRESSION: 1. Right upper extremity brachiocephalic fistula is patent. Residual and recurrent areas of narrowing involving the cephalic vein arch and the right  central veins. These areas were treated with balloon angioplasty. Cephalic vein was angioplastied up to 7 mm and the right central veins were angioplastied up to 12 mm. Mildly improved flow in both areas following balloon angioplasty. 2. 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 Findings are similar to the previous examination. It appears this aneurysm is getting accessed during dialysis and likely contributes to the poor flow. Recommend avoiding this area for dialysis access. Consider surgical consultation of this aneurysm. ACCESS: This access remains amenable to future percutaneous interventions as clinically indicated. Electronically Signed   By: Richarda Overlie M.D.   On: 06/05/2023 15:07   ECHOCARDIOGRAM COMPLETE  Result Date: 05/29/2023    ECHOCARDIOGRAM REPORT   Patient Name:   [name] Date of Exam: 05/29/2023 Medical Rec #:  [id number]     Height:       61.0 in Accession #:    [id number]    Weight:       120.6 lb Date of Birth:  January 21, 1981      BSA:          1.523 m Patient Age:    42 years      BP:           123/55 mmHg Patient Gender: F             HR:           70 bpm. Exam Location:  Inpatient Procedure: 2D Echo, Cardiac Doppler and Color Doppler Indications:    Stroke I63.9  History:        Patient has prior history of Echocardiogram examinations, most                 recent 07/24/2022. CAD, PAD and Stroke; Risk                 Factors:Hypertension, Diabetes, Dyslipidemia and Non-Smoker.  Sonographer:    Dondra Prader RVT RCS Referring Phys: [id number] ASHISH ARORA  Sonographer Comments: Technically challenging due to wound, patient position and lack of cooperation. Patient appeared to be agitated. IMPRESSIONS  1. Left ventricular ejection fraction, by estimation, is 25 to 30%. The left ventricle has severely decreased function. The left ventricle demonstrates regional wall motion abnormalities (see scoring diagram/findings for description). There  subclavian vein were then angioplastied with a 10 mm x 40 mm mustang balloon. Follow-up fistulogram images were obtained. Right innominate vein and right subclavian vein were angioplastied with a 12 mm x 40 mm Mustang balloon and follow-up fistulogram images were obtained. Vascular sheath was removed with a pursestring suture. Bandage was placed. FINDINGS: Right brachiocephalic fistula is patent with an irregular saccular aneurysm involving the mid humeral cephalic vein. These findings are similar to the recent comparison examination. Greater than 50% stenosis involving the cephalic arch. Again noted is narrowing and irregularity involving the right subclavian vein and right innominate vein. Narrowing in the right innominate vein appears to be close to 50%. Again noted are collateral vessels in the right axillary region. SVC is patent. Cephalic vein in the lower humeral region is heavily calcified and the arterial anastomosis is patent. Following balloon angioplasty of the cephalic arch and the right central veins, there is mildly improved flow in both areas. There continues to be mild irregularity and narrowing throughout these areas and persistent filling of the collateral vessels. IMPRESSION: 1. Right upper extremity brachiocephalic fistula is patent. Residual and recurrent areas of narrowing involving the cephalic vein arch and the right  central veins. These areas were treated with balloon angioplasty. Cephalic vein was angioplastied up to 7 mm and the right central veins were angioplastied up to 12 mm. Mildly improved flow in both areas following balloon angioplasty. 2. 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 Findings are similar to the previous examination. It appears this aneurysm is getting accessed during dialysis and likely contributes to the poor flow. Recommend avoiding this area for dialysis access. Consider surgical consultation of this aneurysm. ACCESS: This access remains amenable to future percutaneous interventions as clinically indicated. Electronically Signed   By: Richarda Overlie M.D.   On: 06/05/2023 15:07   ECHOCARDIOGRAM COMPLETE  Result Date: 05/29/2023    ECHOCARDIOGRAM REPORT   Patient Name:   [name] Date of Exam: 05/29/2023 Medical Rec #:  [id number]     Height:       61.0 in Accession #:    [id number]    Weight:       120.6 lb Date of Birth:  January 21, 1981      BSA:          1.523 m Patient Age:    42 years      BP:           123/55 mmHg Patient Gender: F             HR:           70 bpm. Exam Location:  Inpatient Procedure: 2D Echo, Cardiac Doppler and Color Doppler Indications:    Stroke I63.9  History:        Patient has prior history of Echocardiogram examinations, most                 recent 07/24/2022. CAD, PAD and Stroke; Risk                 Factors:Hypertension, Diabetes, Dyslipidemia and Non-Smoker.  Sonographer:    Dondra Prader RVT RCS Referring Phys: [id number] ASHISH ARORA  Sonographer Comments: Technically challenging due to wound, patient position and lack of cooperation. Patient appeared to be agitated. IMPRESSIONS  1. Left ventricular ejection fraction, by estimation, is 25 to 30%. The left ventricle has severely decreased function. The left ventricle demonstrates regional wall motion abnormalities (see scoring diagram/findings for description). There  subclavian vein were then angioplastied with a 10 mm x 40 mm mustang balloon. Follow-up fistulogram images were obtained. Right innominate vein and right subclavian vein were angioplastied with a 12 mm x 40 mm Mustang balloon and follow-up fistulogram images were obtained. Vascular sheath was removed with a pursestring suture. Bandage was placed. FINDINGS: Right brachiocephalic fistula is patent with an irregular saccular aneurysm involving the mid humeral cephalic vein. These findings are similar to the recent comparison examination. Greater than 50% stenosis involving the cephalic arch. Again noted is narrowing and irregularity involving the right subclavian vein and right innominate vein. Narrowing in the right innominate vein appears to be close to 50%. Again noted are collateral vessels in the right axillary region. SVC is patent. Cephalic vein in the lower humeral region is heavily calcified and the arterial anastomosis is patent. Following balloon angioplasty of the cephalic arch and the right central veins, there is mildly improved flow in both areas. There continues to be mild irregularity and narrowing throughout these areas and persistent filling of the collateral vessels. IMPRESSION: 1. Right upper extremity brachiocephalic fistula is patent. Residual and recurrent areas of narrowing involving the cephalic vein arch and the right  central veins. These areas were treated with balloon angioplasty. Cephalic vein was angioplastied up to 7 mm and the right central veins were angioplastied up to 12 mm. Mildly improved flow in both areas following balloon angioplasty. 2. 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 Findings are similar to the previous examination. It appears this aneurysm is getting accessed during dialysis and likely contributes to the poor flow. Recommend avoiding this area for dialysis access. Consider surgical consultation of this aneurysm. ACCESS: This access remains amenable to future percutaneous interventions as clinically indicated. Electronically Signed   By: Richarda Overlie M.D.   On: 06/05/2023 15:07   ECHOCARDIOGRAM COMPLETE  Result Date: 05/29/2023    ECHOCARDIOGRAM REPORT   Patient Name:   [name] Date of Exam: 05/29/2023 Medical Rec #:  [id number]     Height:       61.0 in Accession #:    [id number]    Weight:       120.6 lb Date of Birth:  January 21, 1981      BSA:          1.523 m Patient Age:    42 years      BP:           123/55 mmHg Patient Gender: F             HR:           70 bpm. Exam Location:  Inpatient Procedure: 2D Echo, Cardiac Doppler and Color Doppler Indications:    Stroke I63.9  History:        Patient has prior history of Echocardiogram examinations, most                 recent 07/24/2022. CAD, PAD and Stroke; Risk                 Factors:Hypertension, Diabetes, Dyslipidemia and Non-Smoker.  Sonographer:    Dondra Prader RVT RCS Referring Phys: [id number] ASHISH ARORA  Sonographer Comments: Technically challenging due to wound, patient position and lack of cooperation. Patient appeared to be agitated. IMPRESSIONS  1. Left ventricular ejection fraction, by estimation, is 25 to 30%. The left ventricle has severely decreased function. The left ventricle demonstrates regional wall motion abnormalities (see scoring diagram/findings for description). There  subclavian vein were then angioplastied with a 10 mm x 40 mm mustang balloon. Follow-up fistulogram images were obtained. Right innominate vein and right subclavian vein were angioplastied with a 12 mm x 40 mm Mustang balloon and follow-up fistulogram images were obtained. Vascular sheath was removed with a pursestring suture. Bandage was placed. FINDINGS: Right brachiocephalic fistula is patent with an irregular saccular aneurysm involving the mid humeral cephalic vein. These findings are similar to the recent comparison examination. Greater than 50% stenosis involving the cephalic arch. Again noted is narrowing and irregularity involving the right subclavian vein and right innominate vein. Narrowing in the right innominate vein appears to be close to 50%. Again noted are collateral vessels in the right axillary region. SVC is patent. Cephalic vein in the lower humeral region is heavily calcified and the arterial anastomosis is patent. Following balloon angioplasty of the cephalic arch and the right central veins, there is mildly improved flow in both areas. There continues to be mild irregularity and narrowing throughout these areas and persistent filling of the collateral vessels. IMPRESSION: 1. Right upper extremity brachiocephalic fistula is patent. Residual and recurrent areas of narrowing involving the cephalic vein arch and the right  central veins. These areas were treated with balloon angioplasty. Cephalic vein was angioplastied up to 7 mm and the right central veins were angioplastied up to 12 mm. Mildly improved flow in both areas following balloon angioplasty. 2. 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 Findings are similar to the previous examination. It appears this aneurysm is getting accessed during dialysis and likely contributes to the poor flow. Recommend avoiding this area for dialysis access. Consider surgical consultation of this aneurysm. ACCESS: This access remains amenable to future percutaneous interventions as clinically indicated. Electronically Signed   By: Richarda Overlie M.D.   On: 06/05/2023 15:07   ECHOCARDIOGRAM COMPLETE  Result Date: 05/29/2023    ECHOCARDIOGRAM REPORT   Patient Name:   [name] Date of Exam: 05/29/2023 Medical Rec #:  [id number]     Height:       61.0 in Accession #:    [id number]    Weight:       120.6 lb Date of Birth:  January 21, 1981      BSA:          1.523 m Patient Age:    42 years      BP:           123/55 mmHg Patient Gender: F             HR:           70 bpm. Exam Location:  Inpatient Procedure: 2D Echo, Cardiac Doppler and Color Doppler Indications:    Stroke I63.9  History:        Patient has prior history of Echocardiogram examinations, most                 recent 07/24/2022. CAD, PAD and Stroke; Risk                 Factors:Hypertension, Diabetes, Dyslipidemia and Non-Smoker.  Sonographer:    Dondra Prader RVT RCS Referring Phys: [id number] ASHISH ARORA  Sonographer Comments: Technically challenging due to wound, patient position and lack of cooperation. Patient appeared to be agitated. IMPRESSIONS  1. Left ventricular ejection fraction, by estimation, is 25 to 30%. The left ventricle has severely decreased function. The left ventricle demonstrates regional wall motion abnormalities (see scoring diagram/findings for description). There  subclavian vein were then angioplastied with a 10 mm x 40 mm mustang balloon. Follow-up fistulogram images were obtained. Right innominate vein and right subclavian vein were angioplastied with a 12 mm x 40 mm Mustang balloon and follow-up fistulogram images were obtained. Vascular sheath was removed with a pursestring suture. Bandage was placed. FINDINGS: Right brachiocephalic fistula is patent with an irregular saccular aneurysm involving the mid humeral cephalic vein. These findings are similar to the recent comparison examination. Greater than 50% stenosis involving the cephalic arch. Again noted is narrowing and irregularity involving the right subclavian vein and right innominate vein. Narrowing in the right innominate vein appears to be close to 50%. Again noted are collateral vessels in the right axillary region. SVC is patent. Cephalic vein in the lower humeral region is heavily calcified and the arterial anastomosis is patent. Following balloon angioplasty of the cephalic arch and the right central veins, there is mildly improved flow in both areas. There continues to be mild irregularity and narrowing throughout these areas and persistent filling of the collateral vessels. IMPRESSION: 1. Right upper extremity brachiocephalic fistula is patent. Residual and recurrent areas of narrowing involving the cephalic vein arch and the right  central veins. These areas were treated with balloon angioplasty. Cephalic vein was angioplastied up to 7 mm and the right central veins were angioplastied up to 12 mm. Mildly improved flow in both areas following balloon angioplasty. 2. 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 Findings are similar to the previous examination. It appears this aneurysm is getting accessed during dialysis and likely contributes to the poor flow. Recommend avoiding this area for dialysis access. Consider surgical consultation of this aneurysm. ACCESS: This access remains amenable to future percutaneous interventions as clinically indicated. Electronically Signed   By: Richarda Overlie M.D.   On: 06/05/2023 15:07   ECHOCARDIOGRAM COMPLETE  Result Date: 05/29/2023    ECHOCARDIOGRAM REPORT   Patient Name:   [name] Date of Exam: 05/29/2023 Medical Rec #:  [id number]     Height:       61.0 in Accession #:    [id number]    Weight:       120.6 lb Date of Birth:  January 21, 1981      BSA:          1.523 m Patient Age:    42 years      BP:           123/55 mmHg Patient Gender: F             HR:           70 bpm. Exam Location:  Inpatient Procedure: 2D Echo, Cardiac Doppler and Color Doppler Indications:    Stroke I63.9  History:        Patient has prior history of Echocardiogram examinations, most                 recent 07/24/2022. CAD, PAD and Stroke; Risk                 Factors:Hypertension, Diabetes, Dyslipidemia and Non-Smoker.  Sonographer:    Dondra Prader RVT RCS Referring Phys: [id number] ASHISH ARORA  Sonographer Comments: Technically challenging due to wound, patient position and lack of cooperation. Patient appeared to be agitated. IMPRESSIONS  1. Left ventricular ejection fraction, by estimation, is 25 to 30%. The left ventricle has severely decreased function. The left ventricle demonstrates regional wall motion abnormalities (see scoring diagram/findings for description). There  Physician Discharge Summary  [name] ZOX:[id number] DOB: Aug 01, 1981 DOA: 05/27/2023  PCP: [facility]  Admit date: 05/27/2023 Discharge date: 06/12/2023  Admitted From: LTC Disposition: LT seen  Recommendations for Outpatient Follow-up:  Follow up with PCP in 1-2 weeks Please obtain BMP/CBC in one week your next doctors visit.  Outpatient follow-up with cardiology and vascular as needed. Resume outpatient HD sessions Counseled to remain compliant with her medications.   Discharge Condition: Stable CODE STATUS: Full code Diet recommendation: Diabetic    Brief Narrative:  42 year old with history of DM2, ESRD, bilateral AKA, blindness, PAD initially presented with hypotension from dialysis.  Had a recent prolonged stay in the hospital in September 2024 for small bowel ischemia complicated by SBO and had extended ileocecectomy and discharged to SNF.  Patient has had HD catheter in the right chest wall due to difficulty accessing her AV fistula.  Eventually at SNF found to be also hypotensive during her HD therefore admitted to the ICU and PCCM was initially consulted.  Eventually she was weaned off pressors on 10/16 and transferred to [facility].  Eventually her fistula was evaluated which was functioning adequately per IR.  She tolerated multiple HD sessions without any issues.  For reduced EF cardiology saw the patient and she was deemed to be a poor candidate for any further aggressive workup and recommended outpatient follow-up.  During this time it has been very difficult to get in touch with patient's [facility] POA, Sister [name] to get any formal consents.  Eventually ethics was consulted and stated next point of contact should be her husband.  Assessment & Plan:   Principal Problem:   Septic shock (HCC)   Goals of care discussion  Difficulty getting touch with patient's healthcare power of attorney, [name] sister.  Ethics was consulted. Ok to contact "next of  kin" who is her husband. Palliative is having on going discussions with him.   New onset congestive heart failure with reduced EF 25% -Echocardiogram 2023 had preserved EF, echo done on 05/29/2023 showed EF of 25%.  New change, seen by cardiology but due to multiple comorbidities, continue current management.  No further intervention planned.   End-stage renal disease, likely diabetic nephropathy HD per nephrology.  AVF versus use of HD catheter per nephrology Status post IR right arm fistulogram with balloon angioplasty Occasionally refuses HD, last session 11/28.   Septic Shock secondary to suspected wound/skin/soft tissue infection, POA Acute metabolic encephalopathy Heart stable.  Continue midodrine 10 mg orally daily.  Can slowly attempt to wean this to off.   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  LDL 19, A1c 5.7. Resume home regimen   DVT prophylaxis: heparin injection 5,000 Units Start: 05/27/23 2200 Code Status:   Code Status: Full Code Family Communication: None at bedside today Will discharge today   Subjective: Seen in HD no complaints.   Examination:   General exam: Chronically ill Respiratory system: Some rhonchi at the bases Cardiovascular system: S1 & S2 heard, RRR. No JVD, murmurs, rubs, gallops or clicks. No pedal edema. Gastrointestinal system: Wound dressing noted Central nervous system: Alert and oriented. No focal neurological deficits. Extremities: Bilateral AKA Skin: No rashes, lesions or ulcers Psychiatry: Judgement and insight appear poor Right upper extremity fistula       Discharge Diagnoses:  Principal  subclavian vein were then angioplastied with a 10 mm x 40 mm mustang balloon. Follow-up fistulogram images were obtained. Right innominate vein and right subclavian vein were angioplastied with a 12 mm x 40 mm Mustang balloon and follow-up fistulogram images were obtained. Vascular sheath was removed with a pursestring suture. Bandage was placed. FINDINGS: Right brachiocephalic fistula is patent with an irregular saccular aneurysm involving the mid humeral cephalic vein. These findings are similar to the recent comparison examination. Greater than 50% stenosis involving the cephalic arch. Again noted is narrowing and irregularity involving the right subclavian vein and right innominate vein. Narrowing in the right innominate vein appears to be close to 50%. Again noted are collateral vessels in the right axillary region. SVC is patent. Cephalic vein in the lower humeral region is heavily calcified and the arterial anastomosis is patent. Following balloon angioplasty of the cephalic arch and the right central veins, there is mildly improved flow in both areas. There continues to be mild irregularity and narrowing throughout these areas and persistent filling of the collateral vessels. IMPRESSION: 1. Right upper extremity brachiocephalic fistula is patent. Residual and recurrent areas of narrowing involving the cephalic vein arch and the right  central veins. These areas were treated with balloon angioplasty. Cephalic vein was angioplastied up to 7 mm and the right central veins were angioplastied up to 12 mm. Mildly improved flow in both areas following balloon angioplasty. 2. 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 Findings are similar to the previous examination. It appears this aneurysm is getting accessed during dialysis and likely contributes to the poor flow. Recommend avoiding this area for dialysis access. Consider surgical consultation of this aneurysm. ACCESS: This access remains amenable to future percutaneous interventions as clinically indicated. Electronically Signed   By: Richarda Overlie M.D.   On: 06/05/2023 15:07   ECHOCARDIOGRAM COMPLETE  Result Date: 05/29/2023    ECHOCARDIOGRAM REPORT   Patient Name:   [name] Date of Exam: 05/29/2023 Medical Rec #:  [id number]     Height:       61.0 in Accession #:    [id number]    Weight:       120.6 lb Date of Birth:  January 21, 1981      BSA:          1.523 m Patient Age:    42 years      BP:           123/55 mmHg Patient Gender: F             HR:           70 bpm. Exam Location:  Inpatient Procedure: 2D Echo, Cardiac Doppler and Color Doppler Indications:    Stroke I63.9  History:        Patient has prior history of Echocardiogram examinations, most                 recent 07/24/2022. CAD, PAD and Stroke; Risk                 Factors:Hypertension, Diabetes, Dyslipidemia and Non-Smoker.  Sonographer:    Dondra Prader RVT RCS Referring Phys: [id number] ASHISH ARORA  Sonographer Comments: Technically challenging due to wound, patient position and lack of cooperation. Patient appeared to be agitated. IMPRESSIONS  1. Left ventricular ejection fraction, by estimation, is 25 to 30%. The left ventricle has severely decreased function. The left ventricle demonstrates regional wall motion abnormalities (see scoring diagram/findings for description). There  subclavian vein were then angioplastied with a 10 mm x 40 mm mustang balloon. Follow-up fistulogram images were obtained. Right innominate vein and right subclavian vein were angioplastied with a 12 mm x 40 mm Mustang balloon and follow-up fistulogram images were obtained. Vascular sheath was removed with a pursestring suture. Bandage was placed. FINDINGS: Right brachiocephalic fistula is patent with an irregular saccular aneurysm involving the mid humeral cephalic vein. These findings are similar to the recent comparison examination. Greater than 50% stenosis involving the cephalic arch. Again noted is narrowing and irregularity involving the right subclavian vein and right innominate vein. Narrowing in the right innominate vein appears to be close to 50%. Again noted are collateral vessels in the right axillary region. SVC is patent. Cephalic vein in the lower humeral region is heavily calcified and the arterial anastomosis is patent. Following balloon angioplasty of the cephalic arch and the right central veins, there is mildly improved flow in both areas. There continues to be mild irregularity and narrowing throughout these areas and persistent filling of the collateral vessels. IMPRESSION: 1. Right upper extremity brachiocephalic fistula is patent. Residual and recurrent areas of narrowing involving the cephalic vein arch and the right  central veins. These areas were treated with balloon angioplasty. Cephalic vein was angioplastied up to 7 mm and the right central veins were angioplastied up to 12 mm. Mildly improved flow in both areas following balloon angioplasty. 2. 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 Findings are similar to the previous examination. It appears this aneurysm is getting accessed during dialysis and likely contributes to the poor flow. Recommend avoiding this area for dialysis access. Consider surgical consultation of this aneurysm. ACCESS: This access remains amenable to future percutaneous interventions as clinically indicated. Electronically Signed   By: Richarda Overlie M.D.   On: 06/05/2023 15:07   ECHOCARDIOGRAM COMPLETE  Result Date: 05/29/2023    ECHOCARDIOGRAM REPORT   Patient Name:   [name] Date of Exam: 05/29/2023 Medical Rec #:  [id number]     Height:       61.0 in Accession #:    [id number]    Weight:       120.6 lb Date of Birth:  January 21, 1981      BSA:          1.523 m Patient Age:    42 years      BP:           123/55 mmHg Patient Gender: F             HR:           70 bpm. Exam Location:  Inpatient Procedure: 2D Echo, Cardiac Doppler and Color Doppler Indications:    Stroke I63.9  History:        Patient has prior history of Echocardiogram examinations, most                 recent 07/24/2022. CAD, PAD and Stroke; Risk                 Factors:Hypertension, Diabetes, Dyslipidemia and Non-Smoker.  Sonographer:    Dondra Prader RVT RCS Referring Phys: [id number] ASHISH ARORA  Sonographer Comments: Technically challenging due to wound, patient position and lack of cooperation. Patient appeared to be agitated. IMPRESSIONS  1. Left ventricular ejection fraction, by estimation, is 25 to 30%. The left ventricle has severely decreased function. The left ventricle demonstrates regional wall motion abnormalities (see scoring diagram/findings for description). There  Physician Discharge Summary  [name] ZOX:[id number] DOB: Aug 01, 1981 DOA: 05/27/2023  PCP: [facility]  Admit date: 05/27/2023 Discharge date: 06/12/2023  Admitted From: LTC Disposition: LT seen  Recommendations for Outpatient Follow-up:  Follow up with PCP in 1-2 weeks Please obtain BMP/CBC in one week your next doctors visit.  Outpatient follow-up with cardiology and vascular as needed. Resume outpatient HD sessions Counseled to remain compliant with her medications.   Discharge Condition: Stable CODE STATUS: Full code Diet recommendation: Diabetic    Brief Narrative:  42 year old with history of DM2, ESRD, bilateral AKA, blindness, PAD initially presented with hypotension from dialysis.  Had a recent prolonged stay in the hospital in September 2024 for small bowel ischemia complicated by SBO and had extended ileocecectomy and discharged to SNF.  Patient has had HD catheter in the right chest wall due to difficulty accessing her AV fistula.  Eventually at SNF found to be also hypotensive during her HD therefore admitted to the ICU and PCCM was initially consulted.  Eventually she was weaned off pressors on 10/16 and transferred to [facility].  Eventually her fistula was evaluated which was functioning adequately per IR.  She tolerated multiple HD sessions without any issues.  For reduced EF cardiology saw the patient and she was deemed to be a poor candidate for any further aggressive workup and recommended outpatient follow-up.  During this time it has been very difficult to get in touch with patient's [facility] POA, Sister [name] to get any formal consents.  Eventually ethics was consulted and stated next point of contact should be her husband.  Assessment & Plan:   Principal Problem:   Septic shock (HCC)   Goals of care discussion  Difficulty getting touch with patient's healthcare power of attorney, [name] sister.  Ethics was consulted. Ok to contact "next of  kin" who is her husband. Palliative is having on going discussions with him.   New onset congestive heart failure with reduced EF 25% -Echocardiogram 2023 had preserved EF, echo done on 05/29/2023 showed EF of 25%.  New change, seen by cardiology but due to multiple comorbidities, continue current management.  No further intervention planned.   End-stage renal disease, likely diabetic nephropathy HD per nephrology.  AVF versus use of HD catheter per nephrology Status post IR right arm fistulogram with balloon angioplasty Occasionally refuses HD, last session 11/28.   Septic Shock secondary to suspected wound/skin/soft tissue infection, POA Acute metabolic encephalopathy Heart stable.  Continue midodrine 10 mg orally daily.  Can slowly attempt to wean this to off.   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  LDL 19, A1c 5.7. Resume home regimen   DVT prophylaxis: heparin injection 5,000 Units Start: 05/27/23 2200 Code Status:   Code Status: Full Code Family Communication: None at bedside today Will discharge today   Subjective: Seen in HD no complaints.   Examination:   General exam: Chronically ill Respiratory system: Some rhonchi at the bases Cardiovascular system: S1 & S2 heard, RRR. No JVD, murmurs, rubs, gallops or clicks. No pedal edema. Gastrointestinal system: Wound dressing noted Central nervous system: Alert and oriented. No focal neurological deficits. Extremities: Bilateral AKA Skin: No rashes, lesions or ulcers Psychiatry: Judgement and insight appear poor Right upper extremity fistula       Discharge Diagnoses:  Principal  subclavian vein were then angioplastied with a 10 mm x 40 mm mustang balloon. Follow-up fistulogram images were obtained. Right innominate vein and right subclavian vein were angioplastied with a 12 mm x 40 mm Mustang balloon and follow-up fistulogram images were obtained. Vascular sheath was removed with a pursestring suture. Bandage was placed. FINDINGS: Right brachiocephalic fistula is patent with an irregular saccular aneurysm involving the mid humeral cephalic vein. These findings are similar to the recent comparison examination. Greater than 50% stenosis involving the cephalic arch. Again noted is narrowing and irregularity involving the right subclavian vein and right innominate vein. Narrowing in the right innominate vein appears to be close to 50%. Again noted are collateral vessels in the right axillary region. SVC is patent. Cephalic vein in the lower humeral region is heavily calcified and the arterial anastomosis is patent. Following balloon angioplasty of the cephalic arch and the right central veins, there is mildly improved flow in both areas. There continues to be mild irregularity and narrowing throughout these areas and persistent filling of the collateral vessels. IMPRESSION: 1. Right upper extremity brachiocephalic fistula is patent. Residual and recurrent areas of narrowing involving the cephalic vein arch and the right  central veins. These areas were treated with balloon angioplasty. Cephalic vein was angioplastied up to 7 mm and the right central veins were angioplastied up to 12 mm. Mildly improved flow in both areas following balloon angioplasty. 2. 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 Findings are similar to the previous examination. It appears this aneurysm is getting accessed during dialysis and likely contributes to the poor flow. Recommend avoiding this area for dialysis access. Consider surgical consultation of this aneurysm. ACCESS: This access remains amenable to future percutaneous interventions as clinically indicated. Electronically Signed   By: Richarda Overlie M.D.   On: 06/05/2023 15:07   ECHOCARDIOGRAM COMPLETE  Result Date: 05/29/2023    ECHOCARDIOGRAM REPORT   Patient Name:   [name] Date of Exam: 05/29/2023 Medical Rec #:  [id number]     Height:       61.0 in Accession #:    [id number]    Weight:       120.6 lb Date of Birth:  January 21, 1981      BSA:          1.523 m Patient Age:    42 years      BP:           123/55 mmHg Patient Gender: F             HR:           70 bpm. Exam Location:  Inpatient Procedure: 2D Echo, Cardiac Doppler and Color Doppler Indications:    Stroke I63.9  History:        Patient has prior history of Echocardiogram examinations, most                 recent 07/24/2022. CAD, PAD and Stroke; Risk                 Factors:Hypertension, Diabetes, Dyslipidemia and Non-Smoker.  Sonographer:    Dondra Prader RVT RCS Referring Phys: [id number] ASHISH ARORA  Sonographer Comments: Technically challenging due to wound, patient position and lack of cooperation. Patient appeared to be agitated. IMPRESSIONS  1. Left ventricular ejection fraction, by estimation, is 25 to 30%. The left ventricle has severely decreased function. The left ventricle demonstrates regional wall motion abnormalities (see scoring diagram/findings for description). There  Physician Discharge Summary  [name] ZOX:[id number] DOB: Aug 01, 1981 DOA: 05/27/2023  [facility]P: [facility]  Admit date: 05/27/2023 Discharge date: 06/12/2023  Admitted From: LTC Disposition: LT seen  Recommendations for Outpatient Follow-up:  Follow up with PCP in 1-2 weeks Please obtain BMP/CBC in one week your next doctors visit.  Outpatient follow-up with cardiology and vascular as needed. Resume outpatient HD sessions Counseled to remain compliant with her medications.   Discharge Condition: Stable CODE STATUS: Full code Diet recommendation: Diabetic    Brief Narrative:  42 year old with history of DM2, ESRD, bilateral AKA, blindness, PAD initially presented with hypotension from dialysis.  Had a recent prolonged stay in the hospital in September 2024 for small bowel ischemia complicated by SBO and had extended ileocecectomy and discharged to SNF.  Patient has had HD catheter in the right chest wall due to difficulty accessing her AV fistula.  Eventually at SNF found to be also hypotensive during her HD therefore admitted to the ICU and PCCM was initially consulted.  Eventually she was weaned off pressors on 10/16 and transferred to [facility].  Eventually her fistula was evaluated which was functioning adequately per IR.  She tolerated multiple HD sessions without any issues.  For reduced EF cardiology saw the patient and she was deemed to be a poor candidate for any further aggressive workup and recommended outpatient follow-up.  During this time it has been very difficult to get in touch with patient's [facility] POA, Sister [name] to get any formal consents.  Eventually ethics was consulted and stated next point of contact should be her husband.  Assessment & Plan:   Principal Problem:   Septic shock (HCC)   Goals of care discussion  Difficulty getting touch with patient's healthcare power of attorney, [name] sister.  Ethics was consulted. Ok to contact "next of  kin" who is her husband. Palliative is having on going discussions with him.   New onset congestive heart failure with reduced EF 25% -Echocardiogram 2023 had preserved EF, echo done on 05/29/2023 showed EF of 25%.  New change, seen by cardiology but due to multiple comorbidities, continue current management.  No further intervention planned.   End-stage renal disease, likely diabetic nephropathy HD per nephrology.  AVF versus use of HD catheter per nephrology Status post IR right arm fistulogram with balloon angioplasty Occasionally refuses HD, last session 11/28.   Septic Shock secondary to suspected wound/skin/soft tissue infection, POA Acute metabolic encephalopathy Heart stable.  Continue midodrine 10 mg orally daily.  Can slowly attempt to wean this to off.   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  LDL 19, A1c 5.7. Resume home regimen   DVT prophylaxis: heparin injection 5,000 Units Start: 05/27/23 2200 Code Status:   Code Status: Full Code Family Communication: None at bedside today Will discharge today   Subjective: Seen in HD no complaints.   Examination:   General exam: Chronically ill Respiratory system: Some rhonchi at the bases Cardiovascular system: S1 & S2 heard, RRR. No JVD, murmurs, rubs, gallops or clicks. No pedal edema. Gastrointestinal system: Wound dressing noted Central nervous system: Alert and oriented. No focal neurological deficits. Extremities: Bilateral AKA Skin: No rashes, lesions or ulcers Psychiatry: Judgement and insight appear poor Right upper extremity fistula       Discharge Diagnoses:  Principal  Physician Discharge Summary  [name] ZOX:[id number] DOB: Aug 01, 1981 DOA: 05/27/2023  PCP: [facility]  Admit date: 05/27/2023 Discharge date: 06/12/2023  Admitted From: LTC Disposition: LT seen  Recommendations for Outpatient Follow-up:  Follow up with PCP in 1-2 weeks Please obtain BMP/CBC in one week your next doctors visit.  Outpatient follow-up with cardiology and vascular as needed. Resume outpatient HD sessions Counseled to remain compliant with her medications.   Discharge Condition: Stable CODE STATUS: Full code Diet recommendation: Diabetic    Brief Narrative:  42 year old with history of DM2, ESRD, bilateral AKA, blindness, PAD initially presented with hypotension from dialysis.  Had a recent prolonged stay in the hospital in September 2024 for small bowel ischemia complicated by SBO and had extended ileocecectomy and discharged to SNF.  Patient has had HD catheter in the right chest wall due to difficulty accessing her AV fistula.  Eventually at SNF found to be also hypotensive during her HD therefore admitted to the ICU and PCCM was initially consulted.  Eventually she was weaned off pressors on 10/16 and transferred to [facility].  Eventually her fistula was evaluated which was functioning adequately per IR.  She tolerated multiple HD sessions without any issues.  For reduced EF cardiology saw the patient and she was deemed to be a poor candidate for any further aggressive workup and recommended outpatient follow-up.  During this time it has been very difficult to get in touch with patient's [facility] POA, Sister [name] to get any formal consents.  Eventually ethics was consulted and stated next point of contact should be her husband.  Assessment & Plan:   Principal Problem:   Septic shock (HCC)   Goals of care discussion  Difficulty getting touch with patient's healthcare power of attorney, [name] sister.  Ethics was consulted. Ok to contact "next of  kin" who is her husband. Palliative is having on going discussions with him.   New onset congestive heart failure with reduced EF 25% -Echocardiogram 2023 had preserved EF, echo done on 05/29/2023 showed EF of 25%.  New change, seen by cardiology but due to multiple comorbidities, continue current management.  No further intervention planned.   End-stage renal disease, likely diabetic nephropathy HD per nephrology.  AVF versus use of HD catheter per nephrology Status post IR right arm fistulogram with balloon angioplasty Occasionally refuses HD, last session 11/28.   Septic Shock secondary to suspected wound/skin/soft tissue infection, POA Acute metabolic encephalopathy Heart stable.  Continue midodrine 10 mg orally daily.  Can slowly attempt to wean this to off.   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  LDL 19, A1c 5.7. Resume home regimen   DVT prophylaxis: heparin injection 5,000 Units Start: 05/27/23 2200 Code Status:   Code Status: Full Code Family Communication: None at bedside today Will discharge today   Subjective: Seen in HD no complaints.   Examination:   General exam: Chronically ill Respiratory system: Some rhonchi at the bases Cardiovascular system: S1 & S2 heard, RRR. No JVD, murmurs, rubs, gallops or clicks. No pedal edema. Gastrointestinal system: Wound dressing noted Central nervous system: Alert and oriented. No focal neurological deficits. Extremities: Bilateral AKA Skin: No rashes, lesions or ulcers Psychiatry: Judgement and insight appear poor Right upper extremity fistula       Discharge Diagnoses:  Principal  subclavian vein were then angioplastied with a 10 mm x 40 mm mustang balloon. Follow-up fistulogram images were obtained. Right innominate vein and right subclavian vein were angioplastied with a 12 mm x 40 mm Mustang balloon and follow-up fistulogram images were obtained. Vascular sheath was removed with a pursestring suture. Bandage was placed. FINDINGS: Right brachiocephalic fistula is patent with an irregular saccular aneurysm involving the mid humeral cephalic vein. These findings are similar to the recent comparison examination. Greater than 50% stenosis involving the cephalic arch. Again noted is narrowing and irregularity involving the right subclavian vein and right innominate vein. Narrowing in the right innominate vein appears to be close to 50%. Again noted are collateral vessels in the right axillary region. SVC is patent. Cephalic vein in the lower humeral region is heavily calcified and the arterial anastomosis is patent. Following balloon angioplasty of the cephalic arch and the right central veins, there is mildly improved flow in both areas. There continues to be mild irregularity and narrowing throughout these areas and persistent filling of the collateral vessels. IMPRESSION: 1. Right upper extremity brachiocephalic fistula is patent. Residual and recurrent areas of narrowing involving the cephalic vein arch and the right  central veins. These areas were treated with balloon angioplasty. Cephalic vein was angioplastied up to 7 mm and the right central veins were angioplastied up to 12 mm. Mildly improved flow in both areas following balloon angioplasty. 2. 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 Findings are similar to the previous examination. It appears this aneurysm is getting accessed during dialysis and likely contributes to the poor flow. Recommend avoiding this area for dialysis access. Consider surgical consultation of this aneurysm. ACCESS: This access remains amenable to future percutaneous interventions as clinically indicated. Electronically Signed   By: Richarda Overlie M.D.   On: 06/05/2023 15:07   ECHOCARDIOGRAM COMPLETE  Result Date: 05/29/2023    ECHOCARDIOGRAM REPORT   Patient Name:   [name] Date of Exam: 05/29/2023 Medical Rec #:  [id number]     Height:       61.0 in Accession #:    [id number]    Weight:       120.6 lb Date of Birth:  January 21, 1981      BSA:          1.523 m Patient Age:    42 years      BP:           123/55 mmHg Patient Gender: F             HR:           70 bpm. Exam Location:  Inpatient Procedure: 2D Echo, Cardiac Doppler and Color Doppler Indications:    Stroke I63.9  History:        Patient has prior history of Echocardiogram examinations, most                 recent 07/24/2022. CAD, PAD and Stroke; Risk                 Factors:Hypertension, Diabetes, Dyslipidemia and Non-Smoker.  Sonographer:    Dondra Prader RVT RCS Referring Phys: [id number] ASHISH ARORA  Sonographer Comments: Technically challenging due to wound, patient position and lack of cooperation. Patient appeared to be agitated. IMPRESSIONS  1. Left ventricular ejection fraction, by estimation, is 25 to 30%. The left ventricle has severely decreased function. The left ventricle demonstrates regional wall motion abnormalities (see scoring diagram/findings for description). There  subclavian vein were then angioplastied with a 10 mm x 40 mm mustang balloon. Follow-up fistulogram images were obtained. Right innominate vein and right subclavian vein were angioplastied with a 12 mm x 40 mm Mustang balloon and follow-up fistulogram images were obtained. Vascular sheath was removed with a pursestring suture. Bandage was placed. FINDINGS: Right brachiocephalic fistula is patent with an irregular saccular aneurysm involving the mid humeral cephalic vein. These findings are similar to the recent comparison examination. Greater than 50% stenosis involving the cephalic arch. Again noted is narrowing and irregularity involving the right subclavian vein and right innominate vein. Narrowing in the right innominate vein appears to be close to 50%. Again noted are collateral vessels in the right axillary region. SVC is patent. Cephalic vein in the lower humeral region is heavily calcified and the arterial anastomosis is patent. Following balloon angioplasty of the cephalic arch and the right central veins, there is mildly improved flow in both areas. There continues to be mild irregularity and narrowing throughout these areas and persistent filling of the collateral vessels. IMPRESSION: 1. Right upper extremity brachiocephalic fistula is patent. Residual and recurrent areas of narrowing involving the cephalic vein arch and the right  central veins. These areas were treated with balloon angioplasty. Cephalic vein was angioplastied up to 7 mm and the right central veins were angioplastied up to 12 mm. Mildly improved flow in both areas following balloon angioplasty. 2. 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 Findings are similar to the previous examination. It appears this aneurysm is getting accessed during dialysis and likely contributes to the poor flow. Recommend avoiding this area for dialysis access. Consider surgical consultation of this aneurysm. ACCESS: This access remains amenable to future percutaneous interventions as clinically indicated. Electronically Signed   By: Richarda Overlie M.D.   On: 06/05/2023 15:07   ECHOCARDIOGRAM COMPLETE  Result Date: 05/29/2023    ECHOCARDIOGRAM REPORT   Patient Name:   [name] Date of Exam: 05/29/2023 Medical Rec #:  [id number]     Height:       61.0 in Accession #:    [id number]    Weight:       120.6 lb Date of Birth:  January 21, 1981      BSA:          1.523 m Patient Age:    42 years      BP:           123/55 mmHg Patient Gender: F             HR:           70 bpm. Exam Location:  Inpatient Procedure: 2D Echo, Cardiac Doppler and Color Doppler Indications:    Stroke I63.9  History:        Patient has prior history of Echocardiogram examinations, most                 recent 07/24/2022. CAD, PAD and Stroke; Risk                 Factors:Hypertension, Diabetes, Dyslipidemia and Non-Smoker.  Sonographer:    Dondra Prader RVT RCS Referring Phys: [id number] ASHISH ARORA  Sonographer Comments: Technically challenging due to wound, patient position and lack of cooperation. Patient appeared to be agitated. IMPRESSIONS  1. Left ventricular ejection fraction, by estimation, is 25 to 30%. The left ventricle has severely decreased function. The left ventricle demonstrates regional wall motion abnormalities (see scoring diagram/findings for description). There

## 2023-06-13 DIAGNOSIS — I959 Hypotension, unspecified: Secondary | ICD-10-CM | POA: Diagnosis not present

## 2023-06-13 DIAGNOSIS — Z515 Encounter for palliative care: Secondary | ICD-10-CM | POA: Diagnosis not present

## 2023-06-13 DIAGNOSIS — A419 Sepsis, unspecified organism: Secondary | ICD-10-CM | POA: Diagnosis not present

## 2023-06-13 DIAGNOSIS — N186 End stage renal disease: Secondary | ICD-10-CM | POA: Diagnosis not present

## 2023-06-13 DIAGNOSIS — R6521 Severe sepsis with septic shock: Secondary | ICD-10-CM | POA: Diagnosis not present

## 2023-06-13 LAB — GLUCOSE, CAPILLARY
Glucose-Capillary: 171 mg/dL — ABNORMAL HIGH (ref 70–99)
Glucose-Capillary: 113 mg/dL — ABNORMAL HIGH (ref 70–99)
Glucose-Capillary: 183 mg/dL — ABNORMAL HIGH (ref 70–99)
Glucose-Capillary: 107 mg/dL — ABNORMAL HIGH (ref 70–99)

## 2023-06-13 MED ORDER — ALBUMIN HUMAN 5 % IV SOLN
25.0000 g | Freq: Once | INTRAVENOUS | Status: AC
  Administered 2023-06-14: 25 g via INTRAVENOUS
  Filled 2023-06-13: qty 500

## 2023-06-13 MED ORDER — CHLORHEXIDINE GLUCONATE CLOTH 2 % EX PADS
6.0000 | MEDICATED_PAD | Freq: Every day | CUTANEOUS | Status: DC
  Administered 2023-06-14 – 2023-06-17 (×4): 6 via TOPICAL

## 2023-06-13 NOTE — Plan of Care (Signed)
  Problem: Pain Management: Goal: Pain level will decrease with appropriate interventions Outcome: Progressing   Problem: Skin Integrity: Goal: Demonstration of wound healing without infection will improve Outcome: Progressing   Problem: Pain Managment: Goal: General experience of comfort will improve Outcome: Progressing   Problem: Skin Integrity: Goal: Risk for impaired skin integrity will decrease Outcome: Progressing

## 2023-06-13 NOTE — Progress Notes (Addendum)
[facility] Progress Note   Subjective:    Seen and examined patient at bedside. Bps remain low at yesterday's HD. On Midodrine and IV Albumin was ordered. Minimal UF noted ( ). Plan to run even tomorrow. She's poorly conditioned and will also have her in the recliner for HD tomorrow to ensure she tolerates full treatment in the chair.  Objective Vitals:   06/12/23 1935 06/13/23 0425 06/13/23 0622 06/13/23 0811  BP: 109/76 106/61  96/71  Pulse: (!) 108 75  77  Resp: 19 18  17   Temp: 97.6 F (36.4 C) 98.7 F (37.1 C)  97.6 F (36.4 C)  TempSrc:  Oral    SpO2: 98% 96%  100%  Weight:   40.8 kg    Physical Exam General: Chronically ill appearing woman, NAD. Room air. Heart: RRR; no murmur Lungs: CTA anteriorly Abdomen: dressings/wound vac in place Extremities: B AKA; no RUE edema or stump Dialysis Access: RUE AVF + bruit  Filed Weights   06/12/23 0824 06/12/23 1303 06/13/23 0622  Weight: 42.2 kg 42.2 kg 40.8 kg    Intake/Output Summary (Last 24 hours) at 06/13/2023 1202 Last data filed at 06/12/2023 1800 Gross per 24 hour  Intake 360 ml  Output -100 ml  Net 460 ml    Additional Objective Labs: Basic Metabolic Panel: Recent Labs  Lab 06/07/23 0904 06/07/23 0911 06/09/23 0955 06/10/23 1448 06/12/23 0827  NA  --    < > 139 138 138  K  --    < > 5.1 4.5 3.8  CL  --    < > 99 99 97*  CO2  --    < > 25 26 24   GLUCOSE  --    < > 156* 105* 253*  BUN  --    < > 39* 42* 27*  CREATININE  --    < > 7.18* 8.37* 5.47*  CALCIUM  --    < > 9.6 9.3 10.0  PHOS 5.3*  --   --  6.4* 5.3*   < > = values in this interval not displayed.   Liver Function Tests: Recent Labs  Lab 06/08/23 0411 06/09/23 0955 06/10/23 1448 06/12/23 0827  AST 15 16 15   --   ALT 16 14 14   --   ALKPHOS 84 86 83  --   BILITOT 0.9 0.6 0.8  --   PROT 7.0 7.0 6.8  --   ALBUMIN 2.4* 2.2* 2.1* 2.1*   No results for input(s): "LIPASE", "AMYLASE" in the last 168  hours. CBC: Recent Labs  Lab 06/07/23 0911 06/08/23 0411 06/09/23 0955 06/10/23 1448 06/11/23 0711  WBC 11.0* 9.0 9.7 10.1 9.8  HGB 10.8* 11.2* 11.7* 11.5* 12.8  HCT 35.0* 37.3 37.9 37.3 42.7  MCV 96.2 94.9 94.0 95.4 94.9  PLT 255 249 240 272 224   Blood Culture    Component Value Date/Time   SDES BLOOD LEFT ARM 05/27/2023 1527   SPECREQUEST  05/27/2023 1527    BOTTLES DRAWN AEROBIC AND ANAEROBIC Blood Culture adequate volume   CULT  05/27/2023 1527    NO GROWTH 5 DAYS Performed at [facility], [address], [place], [place] [address]    REPTSTATUS 06/01/2023 FINAL 05/27/2023 1527    Cardiac Enzymes: No results for input(s): "CKTOTAL", "CKMB", "CKMBINDEX", "TROPONINI" in the last 168 hours. CBG: Recent Labs  Lab 06/12/23 0809 06/12/23 1707 06/12/23 2037 06/13/23 0810 06/13/23 1138  GLUCAP 249* 163* 154* 171* 113*   Iron Studies:  No results for input(s): "IRON", "TIBC", "TRANSFERRIN", "FERRITIN" in the last 72 hours. Lab Results  Component Value Date   INR 1.9 (H) 05/14/2023   INR 1.3 (H) 04/30/2023   INR 1.3 (H) 06/28/2022   Studies/Results: No results found.  Medications:   aspirin EC  81 mg Oral Daily   atorvastatin  20 mg Oral Daily   Chlorhexidine Gluconate Cloth  6 each Topical Q0600   collagenase   Topical BID   darbepoetin (ARANESP) injection - DIALYSIS  60 mcg Subcutaneous Q Fri-1800   dorzolamide-timolol  1 drop Left Eye BID   dronabinol  5 mg Oral BID AC   feeding supplement (NEPRO CARB STEADY)  237 mL Oral BID BM   haloperidol lactate  1 mg Intravenous Once   heparin  5,000 Units Subcutaneous Q8H   insulin aspart  0-6 Units Subcutaneous TID WC   lidocaine  1 patch Transdermal Q24H   midodrine  10 mg Oral TID WC    Dialysis Orders: [facility] MWF  400/1.5 auto, 2K/2Ca, EDW 49kg, mircera 75 q2wks   Assessment/Plan: 1. Shock: presumed septic, now off pressors, on midodrine. Suspected related to abd wound.  S/p abx, per primary 2.  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 Necrotic tissue noted in wound and around ileostomy.  WC and surgery following.   3. 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  CTA completed. EEG with no seizure like activity. Per Neuro.  4. ESRD - MWF schedule dialysis on schedule. S/p fgram 10/23 with IR: multiple areas of narrowing, s/p balloon angioplasty. Refused HD 10/25 and 10/26 but has agreed to treatments thus far. Also agreed to be placed in the recliner for HD tomorrow. 5. HTN/volume - Hypotensive, on midodrine. Use IV Albumin 25g with HD for additional BP support if needed. Okay to give an extra Midodrine dose mid-run if needed. Volume status improved - WELL below prior EDW, will adjust at dc. 6. Anemia of CKD- Hgb now 12.8 - continue Aranesp q Fri for now but will stop if Hgb continues to trend up. 7. Secondary hyperparathyroidism - CorrCa high. Binders on hold. Senispar on hold d/t GI issues. Not on VDRA.  Repeat PTH and Phos. 8. Nutrition - Alb 2.4, continue supps.   Tracey Holmes, NP [facility] 06/13/2023,12:02 PM  LOS: 17 days    Patient has had ongoing pain. It is highly unlikely that she will be able to dialyze in a chair. And with her comorbidities and deconditioning, it is also highly doubtful that she will be ever be able to recover and go to outpatient dialysis. She has been refusing dialysis- she refused again today. And she has been refusing other therapies as well. All in all would feel that transitioning to a focus on comfort care would be appropriate at this time, as opposed to continuing to try further medical treatments which don't seem to be helping.  Rob [facility] 06/13/2023, 1:05 PM

## 2023-06-13 NOTE — Progress Notes (Signed)
PROGRESS NOTE    [name]  ATF:[id number] DOB: 28-Mar-1981 DOA: 06/12/2023 PCP: [facility]     Brief Narrative:  42 year old with history of DM2, ESRD, bilateral AKA, blindness, PAD initially presented with hypotension from dialysis.  Had a recent prolonged stay in the hospital in September 2024 for small bowel ischemia complicated by SBO and had extended ileocecectomy and discharged to SNF.  Patient has had HD catheter in the right chest wall due to difficulty accessing her AV fistula.  Eventually at SNF found to be also hypotensive during her HD therefore admitted to the ICU and PCCM was initially consulted.  Eventually she was weaned off pressors on 10/16 and transferred to [facility].  Eventually her fistula was evaluated which was functioning adequately per IR.  She tolerated multiple HD sessions without any issues.  For reduced EF cardiology saw the patient and she was deemed to be a poor candidate for any further aggressive workup and recommended outpatient follow-up.  During this time it has been very difficult to get in touch with patient's [facility] POA, Sister [name] to get any formal consents.  Eventually ethics was consulted and stated next point of contact should be her husband.  Assessment & Plan:   Principal Problem:   Septic shock (HCC)   Goals of care discussion  Difficulty getting touch with patient's healthcare power of attorney, [name] sister.  Ethics was consulted. Ok to contact "next of kin" who is her husband. Palliative is having on going discussions with him.   New onset congestive heart failure with reduced EF 25% -Echocardiogram 2023 had preserved EF, echo done on 05/29/2023 showed EF of 25%.  New change, seen by cardiology but due to multiple comorbidities, continue current management.  No further intervention planned.   End-stage renal disease, likely diabetic nephropathy HD per nephrology.  AVF versus use of HD catheter per nephrology Status  post IR right arm fistulogram with balloon angioplasty Needs to try hemodialysis in chair   Septic Shock secondary to suspected wound/skin/soft tissue infection, POA Acute metabolic encephalopathy Heart stable.  Continue midodrine 10 mg orally daily.  Can slowly attempt to wean this to off.   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  LDL 19, A1c 5.7. Resume home regimen   DVT prophylaxis: heparin injection 5,000 Units Start: 05/17/2023 2200 Code Status:   Full Code Family Communication: Nephro to make sure she is able to tolerate dialysis in chair.  Thereafter discharge   Subjective: No complaints Wants me to leave her room again this morning.    Examination:   General exam: Chronically ill Respiratory system: Some rhonchi at the bases Cardiovascular system: S1 & S2 heard, RRR. No JVD, murmurs, rubs, gallops or clicks. No pedal edema. Gastrointestinal system: Wound dressing noted Central nervous system: Alert and oriented. No focal neurological deficits. Extremities: Bilateral AKA Skin: No rashes, lesions or ulcers Psychiatry: Judgement and insight appear poor Right upper extremity fistula            Pressure Injury 06/03/2023 Buttocks Left Stage 2 -  Partial thickness loss of dermis presenting as a shallow open injury with a red, pink wound bed without slough. (Active)  05/23/2023 2128  Location: Buttocks  Location Orientation: Left  Staging: Stage 2 -  Partial thickness loss of dermis presenting as a  shallow open injury with a red, pink wound bed without slough.  Wound Description (Comments):   Present on Admission: Yes     Pressure Injury 05/19/2023 Sacrum Stage 1 -  Intact skin with non-blanchable redness of a localized area usually over a bony prominence. Old,  healed pressure injury (Active)  05/21/2023 2128  Location: Sacrum  Location Orientation:   Staging: Stage 1 -  Intact skin with non-blanchable redness of a localized area usually over a bony prominence.  Wound Description (Comments): Old, healed pressure injury  Present on Admission: Yes     Diet Orders (From admission, onward)     Start     Ordered   06/05/23 1144  Diet regular Room service appropriate? Yes; Fluid consistency: Thin  Diet effective now       Question Answer Comment  Room service appropriate? Yes   Fluid consistency: Thin      06/05/23 1143            Objective: Vitals:   06/12/23 1935 06/13/23 0425 06/13/23 0622 06/13/23 0811  BP: 109/76 106/61  96/71  Pulse: (!) 108 75  77  Resp: 19 18  17   Temp: 97.6 F (36.4 C) 98.7 F (37.1 C)  97.6 F (36.4 C)  TempSrc:  Oral    SpO2: 98% 96%  100%  Weight:   40.8 kg     Intake/Output Summary (Last 24 hours) at 06/13/2023 1315 Last data filed at 06/12/2023 1800 Gross per 24 hour  Intake 120 ml  Output --  Net 120 ml   Filed Weights   06/12/23 0824 06/12/23 1303 06/13/23 0622  Weight: 42.2 kg 42.2 kg 40.8 kg    Scheduled Meds:  aspirin EC  81 mg Oral Daily   atorvastatin  20 mg Oral Daily   [START ON 06/14/2023] Chlorhexidine Gluconate Cloth  6 each Topical Q0600   collagenase   Topical BID   darbepoetin (ARANESP) injection - DIALYSIS  60 mcg Subcutaneous Q Fri-1800   dorzolamide-timolol  1 drop Left Eye BID   dronabinol  5 mg Oral BID AC   feeding supplement (NEPRO CARB STEADY)  237 mL Oral BID BM   haloperidol lactate  1 mg Intravenous Once   heparin  5,000 Units Subcutaneous Q8H   insulin aspart  0-6 Units Subcutaneous TID WC   lidocaine  1 patch Transdermal Q24H   midodrine  10 mg Oral TID WC   Continuous Infusions:  Nutritional status     Body mass index is 17.01 kg/m.  Data Reviewed:   CBC: Recent Labs  Lab 06/07/23 0911 06/08/23 0411 06/09/23 0955 06/10/23 1448  06/11/23 0711  WBC 11.0* 9.0 9.7 10.1 9.8  HGB 10.8* 11.2* 11.7* 11.5* 12.8  HCT 35.0* 37.3 37.9 37.3 42.7  MCV 96.2 94.9 94.0 95.4 94.9  PLT 255 249 240 272 224   Basic Metabolic Panel: Recent Labs  Lab 06/07/23 0904 06/07/23 0911 06/07/23 0911 06/08/23 0411 06/09/23 0955 06/10/23 1448 06/11/23 0711 06/12/23 0827  NA  --  139  --  138 139 138  --  138  K  --  4.6  --  4.7 5.1 4.5  --  3.8  CL  --  97*  --  96* 99 99  --  97*  CO2  --  23  --  25 25 26   --  24  GLUCOSE  --  135*  --  281* 156* 105*  --  253*  BUN  --  24*  --  32* 39* 42*  --  27*  CREATININE  --  4.73*  --  5.90* 7.18* 8.37*  --  5.47*  CALCIUM  --  10.1  --  10.0 9.6 9.3  --  10.0  MG  --  1.8   < > 1.8 1.7 1.8 1.5* 2.0  PHOS 5.3*  --   --   --   --  6.4*  --  5.3*   < > = values in this interval not displayed.   GFR: Estimated Creatinine Clearance: 8.7 mL/min (A) (by C-G formula based on SCr of 5.47 mg/dL (H)). Liver Function Tests: Recent Labs  Lab 06/07/23 0911 06/08/23 0411 06/09/23 0955 06/10/23 1448 06/12/23 0827  AST 19 15 16 15   --   ALT 16 16 14 14   --   ALKPHOS 76 84 86 83  --   BILITOT 1.0 0.9 0.6 0.8  --   PROT 7.2 7.0 7.0 6.8  --   ALBUMIN 2.9* 2.4* 2.2* 2.1* 2.1*   No results for input(s): "LIPASE", "AMYLASE" in the last 168 hours. No results for input(s): "AMMONIA" in the last 168 hours. Coagulation Profile: No results for input(s): "INR", "PROTIME" in the last 168 hours. Cardiac Enzymes: No results for input(s): "CKTOTAL", "CKMB", "CKMBINDEX", "TROPONINI" in the last 168 hours. BNP (last 3 results) No results for input(s): "PROBNP" in the last 8760 hours. HbA1C: No results for input(s): "HGBA1C" in the last 72 hours. CBG: Recent Labs  Lab 06/12/23 0809 06/12/23 1707 06/12/23 2037 06/13/23 0810 06/13/23 1138  GLUCAP 249* 163* 154* 171* 113*   Lipid Profile: No results for input(s): "CHOL", "HDL", "LDLCALC", "TRIG", "CHOLHDL", "LDLDIRECT" in the last 72  hours. Thyroid Function Tests: No results for input(s): "TSH", "T4TOTAL", "FREET4", "T3FREE", "THYROIDAB" in the last 72 hours. Anemia Panel: No results for input(s): "VITAMINB12", "FOLATE", "FERRITIN", "TIBC", "IRON", "RETICCTPCT" in the last 72 hours. Sepsis Labs: No results for input(s): "PROCALCITON", "LATICACIDVEN" in the last 168 hours.  No results found for this or any previous visit (from the past 240 hour(s)).       Radiology Studies: No results found.         LOS: 17 days   Time spent= 35 mins    Miguel Rota, MD [facility]  If 7PM-7AM, please contact night-coverage  06/13/2023, 1:15 PM
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
## 2023-06-14 DIAGNOSIS — R6521 Severe sepsis with septic shock: Secondary | ICD-10-CM | POA: Diagnosis not present

## 2023-06-14 DIAGNOSIS — N186 End stage renal disease: Secondary | ICD-10-CM | POA: Diagnosis not present

## 2023-06-14 DIAGNOSIS — Z515 Encounter for palliative care: Secondary | ICD-10-CM | POA: Diagnosis not present

## 2023-06-14 DIAGNOSIS — I959 Hypotension, unspecified: Secondary | ICD-10-CM | POA: Diagnosis not present

## 2023-06-14 DIAGNOSIS — A419 Sepsis, unspecified organism: Secondary | ICD-10-CM | POA: Diagnosis not present

## 2023-06-14 DIAGNOSIS — R638 Other symptoms and signs concerning food and fluid intake: Secondary | ICD-10-CM

## 2023-06-14 LAB — GLUCOSE, CAPILLARY
Glucose-Capillary: 111 mg/dL — ABNORMAL HIGH (ref 70–99)
Glucose-Capillary: 124 mg/dL — ABNORMAL HIGH (ref 70–99)
Glucose-Capillary: 141 mg/dL — ABNORMAL HIGH (ref 70–99)
Glucose-Capillary: 168 mg/dL — ABNORMAL HIGH (ref 70–99)

## 2023-06-14 MED ORDER — HEPARIN SODIUM (PORCINE) 1000 UNIT/ML DIALYSIS: 1000.0000 [IU] | INTRAMUSCULAR | Status: DC | PRN

## 2023-06-14 MED ORDER — LIDOCAINE-PRILOCAINE 2.5-2.5 % EX CREA: 1.0000 | TOPICAL_CREAM | CUTANEOUS | Status: DC | PRN

## 2023-06-14 MED ORDER — ALBUMIN HUMAN 25 % IV SOLN
25.0000 g | Freq: Once | INTRAVENOUS | Status: AC
  Administered 2023-06-14: 25 g via INTRAVENOUS
  Filled 2023-06-14: qty 100

## 2023-06-14 MED ORDER — ALBUMIN HUMAN 25 % IV SOLN: 12.5000 g | Freq: Once | INTRAVENOUS | Status: DC

## 2023-06-14 MED ORDER — LIDOCAINE HCL (PF) 1 % IJ SOLN: 5.0000 mL | INTRAMUSCULAR | Status: DC | PRN

## 2023-06-14 MED ORDER — PENTAFLUOROPROP-TETRAFLUOROETH EX AERO: 1.0000 | INHALATION_SPRAY | CUTANEOUS | Status: DC | PRN

## 2023-06-14 MED ORDER — ALTEPLASE 2 MG IJ SOLR: 2.0000 mg | Freq: Once | INTRAMUSCULAR | Status: DC | PRN

## 2023-06-14 NOTE — Progress Notes (Signed)
   06/13/23 2058  Assess: MEWS Score  Temp 98 F (36.7 C)  BP (!) 79/61  MAP (mmHg) 68  Pulse Rate (!) 57  Resp 16  Level of Consciousness Alert  SpO2 (!) 84 %  O2 Device Room Air  Assess: MEWS Score  MEWS Temp 0  MEWS Systolic 2  MEWS Pulse 0  MEWS RR 0  MEWS LOC 0  MEWS Score 2  MEWS Score Color Yellow  Assess: if the MEWS score is Yellow or Red  Were vital signs accurate and taken at a resting state? Yes  Does the patient meet 2 or more of the SIRS criteria? No  Does the patient have a confirmed or suspected source of infection? No  MEWS guidelines implemented  Yes, yellow  Treat  MEWS Interventions Considered administering scheduled or prn medications/treatments as ordered  Take Vital Signs  Increase Vital Sign Frequency  Yellow: Q2hr x1, continue Q4hrs until patient remains green for 12hrs  Escalate  MEWS: Escalate Yellow: Discuss with charge nurse and consider notifying provider and/or RRT  Notify: Charge Nurse/RN  Name of Charge Nurse/RN Notified [facility]  Provider Notification  Provider Name/Title A. Virgel Manifold, NP  Date Provider Notified 06/13/23  Time Provider Notified 2150  Method of Notification Page  Notification Reason Other (Comment) (Yellow MEWS)  Provider response Other (Comment) (Repeat bp after 1 hr)  Date of Provider Response 06/13/23  Time of Provider Response 2200  Assess: SIRS CRITERIA  SIRS Temperature  0  SIRS Pulse 0  SIRS Respirations  0  SIRS WBC 0  SIRS Score Sum  0

## 2023-06-14 NOTE — Progress Notes (Addendum)
Per Nurse on floor, Patient refused dialysis today.  Charge Nurse Candice Applewhite informed.  Dr. Arlean Hopping informed on floor.  Stacie Glaze [id number]-KDU

## 2023-06-14 NOTE — Progress Notes (Signed)
PROGRESS NOTE    [name]  XBJ:[id number] DOB: 1981-01-17 DOA: 05/27/2023 PCP: [facility]     Brief Narrative:  42 year old with history of DM2, ESRD, bilateral AKA, blindness, PAD initially presented with hypotension from dialysis.  Had a recent prolonged stay in the hospital in September 2024 for small bowel ischemia complicated by SBO and had extended ileocecectomy and discharged to SNF.  Patient has had HD catheter in the right chest wall due to difficulty accessing her AV fistula.  Eventually at SNF found to be also hypotensive during her HD therefore admitted to the ICU and PCCM was initially consulted.  Eventually she was weaned off pressors on 10/16 and transferred to [facility].  Eventually her fistula was evaluated which was functioning adequately per IR.  She tolerated multiple HD sessions without any issues.  For reduced EF cardiology saw the patient and she was deemed to be a poor candidate for any further aggressive workup and recommended outpatient follow-up.  Fistula seems to be working well, currently waiting for her to able to get dialyzed in chair. Unfortunately she has been refusing HD at times. Palliative in touch with the sister, who is considering Hospice/comfort.   Assessment & Plan:   Principal Problem:   Septic shock (HCC)   Goals of care discussion  Difficulty getting touch with patient's healthcare power of attorney, [name] sister.  Ethics was consulted. Ok to contact "next of kin" who is her husband. Palliative is having on going discussions with him.   New onset congestive heart failure with reduced EF 25% -Echocardiogram 2023 had preserved EF, echo done on 05/29/2023 showed EF of 25%.  New change, seen by cardiology but due to multiple comorbidities, continue current management.  No further intervention planned.   End-stage renal disease, likely diabetic nephropathy HD per nephrology.  AVF versus use of HD catheter per  nephrology Status post IR right arm fistulogram with balloon angioplasty Needs to try hemodialysis in chair. Occasionally refusing HD.    Septic Shock secondary to suspected wound/skin/soft tissue infection, POA Acute metabolic encephalopathy Heart stable.  Continue midodrine 10 mg orally daily.  Can slowly attempt to wean this to off.   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  LDL 19, A1c 5.7. Resume home regimen  On going GOC with Palliative   DVT prophylaxis: heparin  Code Status:   Full Code Family Communication: Nephro to make sure she is able to tolerate dialysis in chair.  Thereafter discharge   Subjective: No complaints.    Examination:   General exam: Chronically ill Respiratory system: Some rhonchi at the bases Cardiovascular system: S1 & S2 heard, RRR. No JVD, murmurs, rubs, gallops or clicks. No pedal edema. Gastrointestinal system: Wound dressing noted Central nervous system: Alert and oriented. No focal neurological deficits. Extremities: Bilateral AKA Skin: No rashes, lesions or ulcers Psychiatry: Judgement and insight appear poor Right upper extremity fistula        Pressure Injury 05/27/23 Buttocks Left Stage 2 -  Partial thickness loss of dermis presenting as a shallow open injury with a red, pink wound bed without slough. (Active)  05/27/23 2128  Location: Buttocks  Location Orientation: Left  Staging: Stage 2 -  Partial thickness loss of dermis presenting as a shallow open injury with a red, pink wound bed without  slough.  Wound Description (Comments):   Present on Admission: Yes     Pressure Injury 05/27/23 Sacrum Stage 1 -  Intact skin with non-blanchable redness of a localized area usually over a bony prominence. Old, healed pressure injury  (Active)  05/27/23 2128  Location: Sacrum  Location Orientation:   Staging: Stage 1 -  Intact skin with non-blanchable redness of a localized area usually over a bony prominence.  Wound Description (Comments): Old, healed pressure injury  Present on Admission: Yes     Diet Orders (From admission, onward)     Start     Ordered   06/05/23 1144  Diet regular Room service appropriate? Yes; Fluid consistency: Thin  Diet effective now       Question Answer Comment  Room service appropriate? Yes   Fluid consistency: Thin      06/05/23 1143            Objective: Vitals:   06/14/23 0026 06/14/23 0300 06/14/23 0500 06/14/23 0804  BP:  (!) 94/58  93/63  Pulse:  70  87  Resp:    17  Temp:  98.2 F (36.8 C)    TempSrc:  Oral    SpO2:  100%  100%  Weight: 40.1 kg  40.4 kg   Height: 5' 0.98" (1.549 m)       Intake/Output Summary (Last 24 hours) at 06/14/2023 1320 Last data filed at 06/14/2023 0651 Gross per 24 hour  Intake 356.81 ml  Output 400 ml  Net -43.19 ml   Filed Weights   06/13/23 0622 06/14/23 0026 06/14/23 0500  Weight: 40.8 kg 40.1 kg 40.4 kg    Scheduled Meds:  aspirin EC  81 mg Oral Daily   atorvastatin  20 mg Oral Daily   Chlorhexidine Gluconate Cloth  6 each Topical Q0600   collagenase   Topical BID   darbepoetin (ARANESP) injection - DIALYSIS  60 mcg Subcutaneous Q Fri-1800   dorzolamide-timolol  1 drop Left Eye BID   dronabinol  5 mg Oral BID AC   feeding supplement (NEPRO CARB STEADY)  237 mL Oral BID BM   haloperidol lactate  1 mg Intravenous Once   heparin  5,000 Units Subcutaneous Q8H   insulin aspart  0-6 Units Subcutaneous TID WC   lidocaine  1 patch Transdermal Q24H   midodrine  10 mg Oral TID WC   Continuous Infusions:  Nutritional status     Body mass index is 16.83 kg/m.  Data Reviewed:   CBC: Recent Labs  Lab 06/08/23 0411 06/09/23 0955 06/10/23 1448 06/11/23 0711  WBC 9.0 9.7 10.1 9.8  HGB 11.2* 11.7* 11.5* 12.8  HCT  37.3 37.9 37.3 42.7  MCV 94.9 94.0 95.4 94.9  PLT 249 240 272 224   Basic Metabolic Panel: Recent Labs  Lab 06/08/23 0411 06/09/23 0955 06/10/23 1448 06/11/23 0711 06/12/23 0827  NA 138 139 138  --  138  K 4.7 5.1 4.5  --  3.8  CL 96* 99 99  --  97*  CO2 25 25 26   --  24  GLUCOSE 281* 156* 105*  --  253*  BUN 32* 39* 42*  --  27*  CREATININE 5.90* 7.18* 8.37*  --  5.47*  CALCIUM 10.0 9.6 9.3  --  10.0  MG 1.8 1.7 1.8 1.5* 2.0  PHOS  --   --  6.4*  --  5.3*   GFR: Estimated Creatinine Clearance: 8.6 mL/min (A) (by C-G formula based on SCr  of 5.47 mg/dL (H)). Liver Function Tests: Recent Labs  Lab 06/08/23 0411 06/09/23 0955 06/10/23 1448 06/12/23 0827  AST 15 16 15   --   ALT 16 14 14   --   ALKPHOS 84 86 83  --   BILITOT 0.9 0.6 0.8  --   PROT 7.0 7.0 6.8  --   ALBUMIN 2.4* 2.2* 2.1* 2.1*   No results for input(s): "LIPASE", "AMYLASE" in the last 168 hours. No results for input(s): "AMMONIA" in the last 168 hours. Coagulation Profile: No results for input(s): "INR", "PROTIME" in the last 168 hours. Cardiac Enzymes: No results for input(s): "CKTOTAL", "CKMB", "CKMBINDEX", "TROPONINI" in the last 168 hours. BNP (last 3 results) No results for input(s): "PROBNP" in the last 8760 hours. HbA1C: No results for input(s): "HGBA1C" in the last 72 hours. CBG: Recent Labs  Lab 06/13/23 1626 06/13/23 2103 06/14/23 0017 06/14/23 0802 06/14/23 1157  GLUCAP 183* 107* 141* 124* 111*   Lipid Profile: No results for input(s): "CHOL", "HDL", "LDLCALC", "TRIG", "CHOLHDL", "LDLDIRECT" in the last 72 hours. Thyroid Function Tests: No results for input(s): "TSH", "T4TOTAL", "FREET4", "T3FREE", "THYROIDAB" in the last 72 hours. Anemia Panel: No results for input(s): "VITAMINB12", "FOLATE", "FERRITIN", "TIBC", "IRON", "RETICCTPCT" in the last 72 hours. Sepsis Labs: No results for input(s): "PROCALCITON", "LATICACIDVEN" in the last 168 hours.  No results found for this or any  previous visit (from the past 240 hour(s)).       Radiology Studies: No results found.         LOS: 18 days   Time spent= 35 mins    Miguel Rota, MD [facility]  If 7PM-7AM, please contact night-coverage  06/14/2023, 1:20 PM

## 2023-06-14 NOTE — Progress Notes (Signed)
Patient scheduled for dialysis today. Patient asked if he wants to be on the reclineer or bed for dialysis, patient said she said she had dialysis yesterday and do not want to go for dialysis today. HD RN made aware.

## 2023-06-14 NOTE — Progress Notes (Signed)
[facility] Progress Note   Subjective: Seen on room, lying on R side. Denies pain at present, appears to be resting comfortably.   Objective Vitals:   06/14/23 0026 06/14/23 0300 06/14/23 0500 06/14/23 0804  BP:  (!) 94/58  93/63  Pulse:  70  (!) 112  Resp:    17  Temp:  98.2 F (36.8 C)    TempSrc:  Oral    SpO2:  100%  100%  Weight: 40.1 kg  40.4 kg   Height: 5' 0.98" (1.549 m)      Physical Exam General: Chronically ill appearing female in NAD Heart: S1,S2 RRR No M/R/G Lungs: CTAB Abdomen: Abdominal drsg intact. Active BS.  Extremities: Bilateral AKA. No stump edema Dialysis Access: R AVF + T/B   Additional Objective Labs: Basic Metabolic Panel: Recent Labs  Lab 06/09/23 0955 06/10/23 1448 06/12/23 0827  NA 139 138 138  K 5.1 4.5 3.8  CL 99 99 97*  CO2 25 26 24   GLUCOSE 156* 105* 253*  BUN 39* 42* 27*  CREATININE 7.18* 8.37* 5.47*  CALCIUM 9.6 9.3 10.0  PHOS  --  6.4* 5.3*   Liver Function Tests: Recent Labs  Lab 06/08/23 0411 06/09/23 0955 06/10/23 1448 06/12/23 0827  AST 15 16 15   --   ALT 16 14 14   --   ALKPHOS 84 86 83  --   BILITOT 0.9 0.6 0.8  --   PROT 7.0 7.0 6.8  --   ALBUMIN 2.4* 2.2* 2.1* 2.1*   No results for input(s): "LIPASE", "AMYLASE" in the last 168 hours. CBC: Recent Labs  Lab 06/08/23 0411 06/09/23 0955 06/10/23 1448 06/11/23 0711  WBC 9.0 9.7 10.1 9.8  HGB 11.2* 11.7* 11.5* 12.8  HCT 37.3 37.9 37.3 42.7  MCV 94.9 94.0 95.4 94.9  PLT 249 240 272 224   Blood Culture    Component Value Date/Time   SDES BLOOD LEFT ARM 05/27/2023 1527   SPECREQUEST  05/27/2023 1527    BOTTLES DRAWN AEROBIC AND ANAEROBIC Blood Culture adequate volume   CULT  05/27/2023 1527    NO GROWTH 5 DAYS Performed at [facility], [address] [place], [place] [address]    REPTSTATUS 06/01/2023 FINAL 05/27/2023 1527    Cardiac Enzymes: No results for input(s): "CKTOTAL", "CKMB", "CKMBINDEX", "TROPONINI" in the last  168 hours. CBG: Recent Labs  Lab 06/13/23 1138 06/13/23 1626 06/13/23 2103 06/14/23 0017 06/14/23 0802  GLUCAP 113* 183* 107* 141* 124*   Iron Studies: No results for input(s): "IRON", "TIBC", "TRANSFERRIN", "FERRITIN" in the last 72 hours. @lablastinr3 @ Studies/Results: No results found. Medications:   aspirin EC  81 mg Oral Daily   atorvastatin  20 mg Oral Daily   Chlorhexidine Gluconate Cloth  6 each Topical Q0600   collagenase   Topical BID   darbepoetin (ARANESP) injection - DIALYSIS  60 mcg Subcutaneous Q Fri-1800   dorzolamide-timolol  1 drop Left Eye BID   dronabinol  5 mg Oral BID AC   feeding supplement (NEPRO CARB STEADY)  237 mL Oral BID BM   haloperidol lactate  1 mg Intravenous Once   heparin  5,000 Units Subcutaneous Q8H   insulin aspart  0-6 Units Subcutaneous TID WC   lidocaine  1 patch Transdermal Q24H   midodrine  10 mg Oral TID WC     Dialysis Orders: [facility] MWF  400/1.5 auto, 2K/2Ca, EDW 49kg, mircera 75 q2wks    Assessment/Plan: 1. Shock: presumed septic, now off  pressors, on midodrine. Suspected related to abd wound.  S/p abx, per primary  2. 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 Necrotic tissue noted in wound and around ileostomy.  WC and surgery following.    3. 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  CTA completed. EEG with no seizure like activity. Per Neuro.   4. ESRD - MWF schedule dialysis on schedule. Refused HD again today. Have ordered HD for 06/15/2023. See # 10  5. HTN/volume - Hypotensive, on midodrine. Use IV Albumin 25g with HD for additional BP support if needed. Okay to give an extra Midodrine dose mid-run if needed. Volume status improved - WELL below prior EDW, will adjust at dc.  6. Anemia of CKD- Hgb now 12.8 - continue Aranesp q Fri for now but will stop if Hgb continues to trend up.  7. Secondary hyperparathyroidism - CorrCa high. Binders on hold. Senispar on hold d/t GI issues. Not  on VDRA.  Repeat PTH and Phos.  8. Nutrition - Alb 2.4, continue supplements  9. AVF-S/p fgram 10/23 with IR: multiple areas of narrowing, s/p balloon angioplasty.   10.GOC: Patient has had ongoing pain. It is highly unlikely that she will be able to dialyze in a chair. And with her comorbidities and deconditioning, it is also highly doubtful that she will be ever be able to recover and go to outpatient dialysis. She has been refusing dialysis- she refused again today. And she has been refusing other therapies as well. All in all would feel that transitioning to a focus on comfort care would be appropriate at this time, as opposed to continuing to try further medical treatments which don't seem to be helping. Palliative Care consulted. Remains a full code for now however plans to meet with sister again this afternoon at 3:30 PM. Hoping for comfort care.   Rita H. Brown NP-C 06/14/2023, 9:30 AM  [employer] [phone]
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
## 2023-06-14 DEATH — deceased

## 2023-06-15 DIAGNOSIS — A419 Sepsis, unspecified organism: Secondary | ICD-10-CM | POA: Diagnosis not present

## 2023-06-15 DIAGNOSIS — R6521 Severe sepsis with septic shock: Secondary | ICD-10-CM | POA: Diagnosis not present

## 2023-06-15 DIAGNOSIS — Z515 Encounter for palliative care: Secondary | ICD-10-CM | POA: Diagnosis not present

## 2023-06-15 DIAGNOSIS — Z7189 Other specified counseling: Secondary | ICD-10-CM | POA: Diagnosis not present

## 2023-06-15 DIAGNOSIS — N186 End stage renal disease: Secondary | ICD-10-CM | POA: Diagnosis not present

## 2023-06-15 LAB — GLUCOSE, CAPILLARY
Glucose-Capillary: 230 mg/dL — ABNORMAL HIGH (ref 70–99)
Glucose-Capillary: 241 mg/dL — ABNORMAL HIGH (ref 70–99)
Glucose-Capillary: 274 mg/dL — ABNORMAL HIGH (ref 70–99)
Glucose-Capillary: 279 mg/dL — ABNORMAL HIGH (ref 70–99)

## 2023-06-15 LAB — CBC
HCT: 40.7 % (ref 36.0–46.0)
Hemoglobin: 12.4 g/dL (ref 12.0–15.0)
MCH: 28.6 pg (ref 26.0–34.0)
MCHC: 30.5 g/dL (ref 30.0–36.0)
MCV: 93.8 fL (ref 80.0–100.0)
Platelets: 206 10*3/uL (ref 150–400)
RBC: 4.34 MIL/uL (ref 3.87–5.11)
RDW: 20.9 % — ABNORMAL HIGH (ref 11.5–15.5)
WBC: 11.8 10*3/uL — ABNORMAL HIGH (ref 4.0–10.5)
nRBC: 0.2 % (ref 0.0–0.2)

## 2023-06-15 LAB — RENAL FUNCTION PANEL
Albumin: 2.8 g/dL — ABNORMAL LOW (ref 3.5–5.0)
Anion gap: 16 — ABNORMAL HIGH (ref 5–15)
BUN: 34 mg/dL — ABNORMAL HIGH (ref 6–20)
CO2: 26 mmol/L (ref 22–32)
Calcium: 10.1 mg/dL (ref 8.9–10.3)
Chloride: 97 mmol/L — ABNORMAL LOW (ref 98–111)
Creatinine, Ser: 5.79 mg/dL — ABNORMAL HIGH (ref 0.44–1.00)
GFR, Estimated: 9 mL/min — ABNORMAL LOW (ref >60)
Glucose, Bld: 186 mg/dL — ABNORMAL HIGH (ref 70–99)
Phosphorus: 4.8 mg/dL — ABNORMAL HIGH (ref 2.5–4.6)
Potassium: 4.4 mmol/L (ref 3.5–5.1)
Sodium: 139 mmol/L (ref 135–145)

## 2023-06-15 MED ORDER — HEPARIN SODIUM (PORCINE) 1000 UNIT/ML DIALYSIS: 1000.0000 [IU] | INTRAMUSCULAR | Status: DC | PRN

## 2023-06-15 MED ORDER — ANTICOAGULANT SODIUM CITRATE 4% (200MG/5ML) IV SOLN: 5.0000 mL | Status: DC | PRN

## 2023-06-15 NOTE — Progress Notes (Signed)
Daily Progress Note   Patient Name: [name]       Date: 06/15/2023 DOB: 08-29-1980  Age: 42 y.o. MRN#: [id number] Attending Physician: Miguel Rota, MD Primary Care Physician: Inc, [facility] Admit Date: 05/27/2023  Reason for Consultation/Follow-up: Establishing goals of care  Subjective: Medical records reviewed including progress notes, labs and imaging. Patient assessed at the bedside. She was resting comfortably. Did not attempt to arouse in order to preserve comfort. Noted she has refused HD again today.  Attempted to call patient's sister to re-schedule family meeting but was unable to reach or leave a voicemail.  Questions and concerns addressed. PMT will continue to support holistically.   Length of Stay: 19  Physical Exam Vitals and nursing note reviewed.  Constitutional:      General: She is not in acute distress.    Comments: Frail appearing  Cardiovascular:     Rate and Rhythm: Normal rate.  Pulmonary:     Effort: No respiratory distress.  Skin:    General: Skin is warm and dry.  Neurological:     Comments: asleep            Vital Signs: BP 137/71 (BP Location: Left Wrist)   Pulse 62   Temp 98 F (36.7 C)   Resp 18   Ht 5' 0.98" (1.549 m)   Wt 40.4 kg   SpO2 100%   BMI 16.83 kg/m  SpO2: SpO2: 100 % O2 Device: O2 Device: Nasal Cannula O2 Flow Rate: O2 Flow Rate (L/min): 2 L/min       Palliative Assessment/Data: PPS 30%   Palliative Care Assessment & Plan   Patient Profile: 42 y.o. female  with past medical history of insulin-dependent diabetes mellitus, resultant end-stage renal disease, bilateral AKA, blindness and severe peripheral arterial disease admitted on 05/27/2023 with hypotension in dialysis.    She had a recent  prolonged hospital stay in September 2024 for small bowel ischemia with resultant small bowel obstruction. She had an extended ileal cecotomy placed. She was discharged to SNF 3 days before this admission.  Per chart, patient was not allowing dressing changes for her abdominal wound.  She is now admitted with septic shock secondary to suspected wound infection and acute encephalopathy.  Assessment: Principal Problem:   Septic shock (HCC)   Concern about end of life  Recommendations/Plan: Continue full code/full scope for now - sister considering hospice and DNR/DNI but wishes to speak with patient first Unfortunately, sister did not arrive for yesterday's scheduled meeting and did not answer phone calls again today Continue efforts to discuss GOC with patient and family PMT will continue to follow and support holistically   Prognosis: Poor long term given several chronic comorbidities and repeat hospitalizations   Discharge Planning: To Be Determined  Total time: I spent 25 minutes in the care of the patient today in the above activities and documenting the encounter.    Richardson Dopp, PA-C Palliative Medicine Team Team phone # [phone]  Thank you for allowing the Palliative Medicine Team to assist in the care of this patient. Please utilize secure chat with additional questions, if there is no response within 30 minutes please call the  above phone number.  Palliative Medicine Team providers are available by phone from 7am to 7pm daily and can be reached through the team cell phone.  Should this patient require assistance outside of these hours, please call the patient's attending physician.  Portions of this note are a verbal dictation therefore any spelling and/or grammatical errors are due to the "Dragon Medical One" system interpretation.

## 2023-06-15 NOTE — Progress Notes (Signed)
[name] Progress Note   Subjective: Refused HD again today. Sister did not show up for [facility] meeting yesterday. Checking labs.      Objective Vitals:   06/14/23 2037 06/15/23 0439 06/15/23 0751 06/15/23 0758  BP: (!) 90/52 104/70  137/71  Pulse: 75 (!) 106 80 62  Resp:  18 17 18   Temp: 97.9 F (36.6 C) 98.2 F (36.8 C) 98 F (36.7 C) 98 F (36.7 C)  TempSrc: Oral Oral Oral   SpO2: 96% 99% 99% 100%  Weight:      Height:       Physical Exam General: Chronically ill appearing female in NAD Heart: S1,S2 RRR No M/R/G Lungs: CTAB Abdomen: Abdominal drsg intact. Active BS.  Extremities: Bilateral AKA. No stump edema Dialysis Access: R AVF + T/B   Additional Objective Labs: Basic Metabolic Panel: Recent Labs  Lab 06/09/23 0955 06/10/23 1448 06/12/23 0827  NA 139 138 138  K 5.1 4.5 3.8  CL 99 99 97*  CO2 25 26 24   GLUCOSE 156* 105* 253*  BUN 39* 42* 27*  CREATININE 7.18* 8.37* 5.47*  CALCIUM 9.6 9.3 10.0  PHOS  --  6.4* 5.3*   Liver Function Tests: Recent Labs  Lab 06/09/23 0955 06/10/23 1448 06/12/23 0827  AST 16 15  --   ALT 14 14  --   ALKPHOS 86 83  --   BILITOT 0.6 0.8  --   PROT 7.0 6.8  --   ALBUMIN 2.2* 2.1* 2.1*   No results for input(s): "LIPASE", "AMYLASE" in the last 168 hours. CBC: Recent Labs  Lab 06/09/23 0955 06/10/23 1448 06/11/23 0711  WBC 9.7 10.1 9.8  HGB 11.7* 11.5* 12.8  HCT 37.9 37.3 42.7  MCV 94.0 95.4 94.9  PLT 240 272 224   Blood Culture    Component Value Date/Time   SDES BLOOD LEFT ARM 05/27/2023 1527   SPECREQUEST  05/27/2023 1527    BOTTLES DRAWN AEROBIC AND ANAEROBIC Blood Culture adequate volume   CULT  05/27/2023 1527    NO GROWTH 5 DAYS Performed at [facility], [address], [place], [place] [address]    REPTSTATUS 06/01/2023 FINAL 05/27/2023 1527    Cardiac Enzymes: No results for input(s): "CKTOTAL", "CKMB", "CKMBINDEX", "TROPONINI" in the last 168 hours. CBG: Recent Labs   Lab 06/14/23 1157 06/14/23 1542 06/15/23 0023 06/15/23 0751 06/15/23 1122  GLUCAP 111* 168* 274* 279* 241*   Iron Studies: No results for input(s): "IRON", "TIBC", "TRANSFERRIN", "FERRITIN" in the last 72 hours. @lablastinr3 @ Studies/Results: No results found. Medications:  anticoagulant sodium citrate      aspirin EC  81 mg Oral Daily   atorvastatin  20 mg Oral Daily   Chlorhexidine Gluconate Cloth  6 each Topical Q0600   collagenase   Topical BID   darbepoetin (ARANESP) injection - DIALYSIS  60 mcg Subcutaneous Q Fri-1800   dorzolamide-timolol  1 drop Left Eye BID   dronabinol  5 mg Oral BID AC   feeding supplement (NEPRO CARB STEADY)  237 mL Oral BID BM   haloperidol lactate  1 mg Intravenous Once   heparin  5,000 Units Subcutaneous Q8H   insulin aspart  0-6 Units Subcutaneous TID WC   lidocaine  1 patch Transdermal Q24H   midodrine  10 mg Oral TID WC     Dialysis Orders: [facility] MWF  400/1.5 auto, 2K/2Ca, EDW 49kg, mircera 75 q2wks    Assessment/Plan: 1. Shock: presumed septic, now off pressors, on  midodrine. Suspected related to abd wound.  S/p abx, per primary   2. 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 Necrotic tissue noted in wound and around ileostomy.  WC and surgery following.     3. 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  CTA completed. EEG with no seizure like activity. Per Neuro.    4. ESRD - MWF schedule dialysis on schedule. Refused HD 06/14/2023. Refused HD again today. Checking RFP. Marland Kitchen See # 10   5. HTN/volume - Hypotensive, on midodrine. Use IV Albumin 25g with HD for additional BP support if needed. Okay to give an extra Midodrine dose mid-run if needed. Volume status improved - WELL below prior EDW, will adjust at dc.   6. Anemia of CKD- Hgb now 12.8 - continue Aranesp q Fri for now but will stop if Hgb continues to trend up.   7. Secondary hyperparathyroidism - CorrCa high. Binders on hold. Senispar on hold  d/t GI issues. Not on VDRA.  Repeat PTH and Phos.   8. Nutrition - Alb 2.4, continue supplements   9. AVF-S/p fgram 10/23 with IR: multiple areas of narrowing, s/p balloon angioplasty.    10.GOC: It is highly unlikely that she will be able to dialyze in a chair. And with her comorbidities and deconditioning, it is also highly doubtful that she will be ever be able to recover and go to outpatient dialysis. She has been refusing dialysis- she refused again today. And she has been refusing other therapies as well. All in all would feel that transitioning to a focus on comfort care would be appropriate at this time. Palliative Care consulted. Remains a full code for now however plans to meet with sister again this afternoon at 3:30 PM. Hoping for comfort care.   Ka Bench H. Renn Dirocco NP-C 06/15/2023, 2:48 PM  [employer] [phone]

## 2023-06-15 NOTE — Progress Notes (Signed)
Pt refused HD when asked by her primary care nurse this am ---I verified this conversation [facility] aware of this pt status---informed by her that she went and talked to the pt and the pt continues to refuse---no other orders at this time

## 2023-06-15 NOTE — Progress Notes (Signed)
PROGRESS NOTE    [name]  FAO:[id number] DOB: [birth year] DOA: 05/27/2023 PCP: Inc, [facility]     Brief Narrative:  42 year old with history of DM2, ESRD, bilateral AKA, blindness, PAD initially presented with hypotension from dialysis.  Had a recent prolonged stay in the hospital in September 2024 for small bowel ischemia complicated by SBO and had extended ileocecectomy and discharged to SNF.  Patient has had HD catheter in the right chest wall due to difficulty accessing her AV fistula.  Eventually at SNF found to be also hypotensive during her HD therefore admitted to the ICU and PCCM was initially consulted.  Eventually she was weaned off pressors on 10/16 and transferred to [facility].  Eventually her fistula was evaluated which was functioning adequately per IR.  She tolerated multiple HD sessions without any issues.  For reduced EF cardiology saw the patient and she was deemed to be a poor candidate for any further aggressive workup and recommended outpatient follow-up.  Fistula seems to be working well, currently waiting for her to able to get dialyzed in chair. Unfortunately she has been refusing HD at times. Palliative in touch with the sister, who is considering Hospice/comfort.   Assessment & Plan:   Principal Problem:   Septic shock (HCC)   Goals of care discussion  Difficulty getting touch with patient's healthcare power of attorney, [name] sister.  Ethics was consulted. Ok to contact "next of kin" who is her husband. Palliative is having on going discussions with him.   New onset congestive heart failure with reduced EF 25% -Echocardiogram 2023 had preserved EF, echo done on 05/29/2023 showed EF of 25%.  New change, seen by cardiology but due to multiple comorbidities, continue current management.  No further intervention planned.   End-stage renal disease, likely diabetic nephropathy HD per nephrology.  AVF versus use of HD catheter per  nephrology Status post IR right arm fistulogram with balloon angioplasty Needs to try hemodialysis in chair. Occasionally refusing HD.    Septic Shock secondary to suspected wound/skin/soft tissue infection, POA Acute metabolic encephalopathy Heart stable.  Continue midodrine 10 mg orally daily.  Can slowly attempt to wean this to off.   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  LDL 19, A1c 5.7. Resume home regimen  On going GOC with Palliative   DVT prophylaxis: heparin  Code Status:   Full Code Family Communication: Nephro to make sure she is able to tolerate dialysis in chair.  Thereafter discharge   Subjective: No complaints.  Only wants pain meds, but appears comfortable.    Examination:   General exam: Chronically ill Respiratory system: Some rhonchi at the bases Cardiovascular system: S1 & S2 heard, RRR. No JVD, murmurs, rubs, gallops or clicks. No pedal edema. Gastrointestinal system: Wound dressing noted Central nervous system: Alert and oriented. No focal neurological deficits. Extremities: Bilateral AKA Skin: No rashes, lesions or ulcers Psychiatry: Judgement and insight appear poor Right upper extremity fistula            Pressure Injury 05/27/23 Buttocks Left Stage 2 -  Partial thickness loss of dermis presenting as a shallow open injury with a red, pink wound bed without slough. (Active)  05/27/23 2128  Location: Buttocks  Location Orientation: Left  Staging: Stage 2 -  Partial thickness loss of dermis presenting  as a shallow open injury with a red, pink wound bed without slough.  Wound Description (Comments):   Present on Admission: Yes     Pressure Injury 05/27/23 Sacrum Stage 1 -  Intact skin with non-blanchable redness of a localized area  usually over a bony prominence. Old, healed pressure injury (Active)  05/27/23 2128  Location: Sacrum  Location Orientation:   Staging: Stage 1 -  Intact skin with non-blanchable redness of a localized area usually over a bony prominence.  Wound Description (Comments): Old, healed pressure injury  Present on Admission: Yes     Diet Orders (From admission, onward)     Start     Ordered   06/05/23 1144  Diet regular Room service appropriate? Yes; Fluid consistency: Thin  Diet effective now       Question Answer Comment  Room service appropriate? Yes   Fluid consistency: Thin      06/05/23 1143            Objective: Vitals:   06/14/23 2037 06/15/23 0439 06/15/23 0751 06/15/23 0758  BP: (!) 90/52 104/70  137/71  Pulse: 75 (!) 106 80 62  Resp:  18 17 18   Temp: 97.9 F (36.6 C) 98.2 F (36.8 C) 98 F (36.7 C) 98 F (36.7 C)  TempSrc: Oral Oral Oral   SpO2: 96% 99% 99% 100%  Weight:      Height:       No intake or output data in the 24 hours ending 06/15/23 1058 Filed Weights   06/13/23 0622 06/14/23 0026 06/14/23 0500  Weight: 40.8 kg 40.1 kg 40.4 kg    Scheduled Meds:  aspirin EC  81 mg Oral Daily   atorvastatin  20 mg Oral Daily   Chlorhexidine Gluconate Cloth  6 each Topical Q0600   collagenase   Topical BID   darbepoetin (ARANESP) injection - DIALYSIS  60 mcg Subcutaneous Q Fri-1800   dorzolamide-timolol  1 drop Left Eye BID   dronabinol  5 mg Oral BID AC   feeding supplement (NEPRO CARB STEADY)  237 mL Oral BID BM   haloperidol lactate  1 mg Intravenous Once   heparin  5,000 Units Subcutaneous Q8H   insulin aspart  0-6 Units Subcutaneous TID WC   lidocaine  1 patch Transdermal Q24H   midodrine  10 mg Oral TID WC   Continuous Infusions:  anticoagulant sodium citrate      Nutritional status     Body mass index is 16.83 kg/m.  Data Reviewed:   CBC: Recent Labs  Lab 06/09/23 0955 06/10/23 1448 06/11/23 0711  WBC 9.7 10.1 9.8  HGB 11.7*  11.5* 12.8  HCT 37.9 37.3 42.7  MCV 94.0 95.4 94.9  PLT 240 272 224   Basic Metabolic Panel: Recent Labs  Lab 06/09/23 0955 06/10/23 1448 06/11/23 0711 06/12/23 0827  NA 139 138  --  138  K 5.1 4.5  --  3.8  CL 99 99  --  97*  CO2 25 26  --  24  GLUCOSE 156* 105*  --  253*  BUN 39* 42*  --  27*  CREATININE 7.18* 8.37*  --  5.47*  CALCIUM 9.6 9.3  --  10.0  MG 1.7 1.8 1.5* 2.0  PHOS  --  6.4*  --  5.3*   GFR: Estimated Creatinine Clearance: 8.6 mL/min (A) (by C-G formula based on SCr of 5.47 mg/dL (H)). Liver Function Tests: Recent Labs  Lab 06/09/23 [phone] 06/10/23 1448 06/12/23 0827  AST 16 15  --   ALT 14 14  --   ALKPHOS 86 83  --   BILITOT 0.6 0.8  --   PROT 7.0 6.8  --   ALBUMIN 2.2* 2.1* 2.1*   No results for input(s): "LIPASE", "AMYLASE" in the last 168 hours. No results for input(s): "AMMONIA" in the last 168 hours. Coagulation Profile: No results for input(s): "INR", "PROTIME" in the last 168 hours. Cardiac Enzymes: No results for input(s): "CKTOTAL", "CKMB", "CKMBINDEX", "TROPONINI" in the last 168 hours. BNP (last 3 results) No results for input(s): "PROBNP" in the last 8760 hours. HbA1C: No results for input(s): "HGBA1C" in the last 72 hours. CBG: Recent Labs  Lab 06/14/23 0802 06/14/23 1157 06/14/23 1542 06/15/23 0023 06/15/23 0751  GLUCAP 124* 111* 168* 274* 279*   Lipid Profile: No results for input(s): "CHOL", "HDL", "LDLCALC", "TRIG", "CHOLHDL", "LDLDIRECT" in the last 72 hours. Thyroid Function Tests: No results for input(s): "TSH", "T4TOTAL", "FREET4", "T3FREE", "THYROIDAB" in the last 72 hours. Anemia Panel: No results for input(s): "VITAMINB12", "FOLATE", "FERRITIN", "TIBC", "IRON", "RETICCTPCT" in the last 72 hours. Sepsis Labs: No results for input(s): "PROCALCITON", "LATICACIDVEN" in the last 168 hours.  No results found for this or any previous visit (from the past 240 hour(s)).       Radiology Studies: No results  found.         LOS: 19 days   Time spent= 35 mins    Miguel Rota, MD [facility]  If 7PM-7AM, please contact night-coverage  06/15/2023, 10:58 AM

## 2023-06-15 NOTE — Progress Notes (Signed)
Pt has refused her dressing changes this evening. When told that we need to change her dressing she stated that, "we are tryilng to kill her."

## 2023-06-16 DIAGNOSIS — R6521 Severe sepsis with septic shock: Secondary | ICD-10-CM | POA: Diagnosis not present

## 2023-06-16 DIAGNOSIS — N186 End stage renal disease: Secondary | ICD-10-CM | POA: Diagnosis not present

## 2023-06-16 DIAGNOSIS — Z7189 Other specified counseling: Secondary | ICD-10-CM | POA: Diagnosis not present

## 2023-06-16 DIAGNOSIS — Z515 Encounter for palliative care: Secondary | ICD-10-CM | POA: Diagnosis not present

## 2023-06-16 DIAGNOSIS — A419 Sepsis, unspecified organism: Secondary | ICD-10-CM | POA: Diagnosis not present

## 2023-06-16 LAB — RENAL FUNCTION PANEL
Albumin: 2.7 g/dL — ABNORMAL LOW (ref 3.5–5.0)
Anion gap: 23 — ABNORMAL HIGH (ref 5–15)
BUN: 47 mg/dL — ABNORMAL HIGH (ref 6–20)
CO2: 18 mmol/L — ABNORMAL LOW (ref 22–32)
Calcium: 10.6 mg/dL — ABNORMAL HIGH (ref 8.9–10.3)
Chloride: 98 mmol/L (ref 98–111)
Creatinine, Ser: 6.53 mg/dL — ABNORMAL HIGH (ref 0.44–1.00)
GFR, Estimated: 8 mL/min — ABNORMAL LOW (ref >60)
Glucose, Bld: 106 mg/dL — ABNORMAL HIGH (ref 70–99)
Phosphorus: 5.8 mg/dL — ABNORMAL HIGH (ref 2.5–4.6)
Potassium: 5.2 mmol/L — ABNORMAL HIGH (ref 3.5–5.1)
Sodium: 139 mmol/L (ref 135–145)

## 2023-06-16 LAB — GLUCOSE, CAPILLARY
Glucose-Capillary: 71 mg/dL (ref 70–99)
Glucose-Capillary: 69 mg/dL — ABNORMAL LOW (ref 70–99)
Glucose-Capillary: 106 mg/dL — ABNORMAL HIGH (ref 70–99)
Glucose-Capillary: 119 mg/dL — ABNORMAL HIGH (ref 70–99)
Glucose-Capillary: 182 mg/dL — ABNORMAL HIGH (ref 70–99)
Glucose-Capillary: 178 mg/dL — ABNORMAL HIGH (ref 70–99)

## 2023-06-16 LAB — CBC
HCT: 40.1 % (ref 36.0–46.0)
Hemoglobin: 12.2 g/dL (ref 12.0–15.0)
MCH: 29 pg (ref 26.0–34.0)
MCHC: 30.4 g/dL (ref 30.0–36.0)
MCV: 95.2 fL (ref 80.0–100.0)
Platelets: 226 10*3/uL (ref 150–400)
RBC: 4.21 MIL/uL (ref 3.87–5.11)
RDW: 20.9 % — ABNORMAL HIGH (ref 11.5–15.5)
WBC: 14.8 10*3/uL — ABNORMAL HIGH (ref 4.0–10.5)
nRBC: 0.2 % (ref 0.0–0.2)

## 2023-06-16 MED ORDER — MIDODRINE HCL 5 MG PO TABS
10.0000 mg | ORAL_TABLET | Freq: Once | ORAL | Status: AC
  Administered 2023-06-16: 10 mg via ORAL
  Filled 2023-06-16: qty 2

## 2023-06-16 MED ORDER — DEXTROSE 50 % IV SOLN
INTRAVENOUS | Status: AC
  Filled 2023-06-16: qty 50

## 2023-06-16 MED ORDER — DEXTROSE 50 % IV SOLN
12.5000 g | INTRAVENOUS | Status: AC
  Administered 2023-06-16: 12.5 g via INTRAVENOUS

## 2023-06-16 MED ORDER — SODIUM ZIRCONIUM CYCLOSILICATE 10 G PO PACK
10.0000 g | PACK | Freq: Once | ORAL | Status: AC
  Administered 2023-06-16: 10 g via ORAL
  Filled 2023-06-16: qty 1

## 2023-06-16 MED ORDER — MIDODRINE HCL 5 MG PO TABS
20.0000 mg | ORAL_TABLET | Freq: Three times a day (TID) | ORAL | Status: DC
  Administered 2023-06-16 – 2023-06-17 (×3): 20 mg via ORAL
  Filled 2023-06-16 (×3): qty 4

## 2023-06-16 MED ORDER — LIDOCAINE 5 % EX PTCH
1.0000 | MEDICATED_PATCH | CUTANEOUS | Status: DC
  Administered 2023-06-16 – 2023-06-18 (×3): 1 via TRANSDERMAL
  Filled 2023-06-16 (×2): qty 1

## 2023-06-16 NOTE — Progress Notes (Signed)
PROGRESS NOTE    [name]  WUJ:[id number] DOB: [birth year] DOA: 05/27/2023 PCP: Inc, [facility]   Brief Narrative:  42 year old with history of DM2, ESRD, bilateral AKA, blindness, PAD initially presented with hypotension from dialysis.  Had a recent prolonged stay in the hospital in September 2024 for small bowel ischemia complicated by SBO and had extended ileocecectomy and discharged to SNF.  Patient has had HD catheter in the right chest wall due to difficulty accessing her AV fistula.  Eventually at SNF found to be also hypotensive during her HD therefore admitted to the ICU and PCCM was initially consulted.  Eventually she was weaned off pressors on 10/16 and transferred to [facility].   Eventually her fistula was evaluated which was functioning adequately per IR.  She tolerated multiple HD sessions without any issues.  For reduced EF, cardiology saw the patient and she was deemed to be a poor candidate for any further aggressive workup and recommended outpatient follow-up.   Fistula seems to be working well, currently waiting for her to able to get dialyzed in chair. Unfortunately she has been refusing HD at times. Palliative in touch with the sister, who is considering Hospice/comfort.   Assessment & Plan:   Goals of care discussion  -Difficulty getting touch with patient's healthcare power of attorney, [name] sister.  Ethics was consulted. Ok to contact "next of kin" who is her husband.  -Palliative care following.  Palliative care was supposed to have family meeting on 06/15/2023 but was unable to reach sister. -Overall prognosis is very poor.  Nephrology recommending transitioning to comfort measures  new onset congestive heart failure with reduced EF 25% -Echocardiogram 2023 had preserved EF, echo done on 05/29/2023 showed EF of 25%.  New change, seen by cardiology but due to multiple comorbidities, continue current management.  No further intervention  planned. -Strict input and output.  Daily weights.  Volume managed by dialysis intermittently by nephrology.   End-stage renal disease, likely diabetic nephropathy HD per nephrology.  AVF versus use of HD catheter per nephrology Status post IR right arm fistulogram with balloon angioplasty Needs to try hemodialysis in chair. Occasionally refusing HD: Has refused hemodialysis on 06/14/2023 and 06/15/23.    Septic Shock secondary to suspected wound/skin/soft tissue infection, POA Acute metabolic encephalopathy Hypotension -Blood pressure still intermittently on the lower side.  Continue midodrine.  Nephrology following. -Under status waxing and waning.  Monitor mental status.  Fall precautions.   Type 2 diabetes with hyperglycemia and hypoglycemia Peripheral vascular disease Bilateral AKA -Continue CBGs with SSI.  Punctate acute infarct R frontal love, acute to subacute infarct right parieto occipital infarct: -EEG negative, seen by neurology.  LDL 19, A1c 5.7. -Continue aspirin and statin.  Outpatient follow-up with neurology.  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  General surgery following intermittently.  Stage II pressure injury of left buttock: Present on admission Stage I sacral pressure injury: Present on admission -Continue local wound care.  Follow wound care consult recommendations  DVT prophylaxis: Heparin subcutaneous Code Status: Full Family Communication: None at bedside Disposition Plan: Status is: Inpatient Remains inpatient appropriate because: Of severity of illness  Consultants: Nephrology/neurology/general surgery/palliative care/cardiology/ethics  Procedures: As above  Antimicrobials: None   Subjective: Patient seen and examined at bedside.  Poor historian.  No seizures, vomiting, agitation  reported.  Refused hemodialysis again yesterday.  Objective: Vitals:   06/15/23 1617 06/15/23 1953 06/16/23 0335 06/16/23 0844  BP: 98/66 (!) 105/46 97/70 (!) 70/48  Pulse: (!) 103  74   Resp: 16 18 17    Temp: 98.2 F (36.8 C) 97.8 F (36.6 C) 97.6 F (36.4 C)   TempSrc: Oral Oral Oral   SpO2: 90% (!) 88% 90%   Weight:      Height:        Intake/Output Summary (Last 24 hours) at 06/16/2023 1011 Last data filed at 06/16/2023 0500 Gross per 24 hour  Intake --  Output 400 ml  Net -400 ml   Filed Weights   06/13/23 0622 06/14/23 0026 06/14/23 0500  Weight: 40.8 kg 40.1 kg 40.4 kg    Examination:  General exam: On room air.  Chronically ill looking  respiratory system: Bilateral decreased breath sounds at bases with scattered crackles Cardiovascular system: S1 & S2 heard, Rate controlled Gastrointestinal system: Abdomen is nondistended, soft and mildly tender.  Abdominal dressing present along with ileostomy Extremities: Bilateral AKA present Central nervous system: Awake, extremely slow to respond.  Poor historian.  Skin: No obvious petechiae/other lesions Psychiatry: Judgement and insight appear extremely poor.  Currently not agitated.   Data Reviewed: I have personally reviewed following labs and imaging studies  CBC: Recent Labs  Lab 06/10/23 1448 06/11/23 0711 06/15/23 1631  WBC 10.1 9.8 11.8*  HGB 11.5* 12.8 12.4  HCT 37.3 42.7 40.7  MCV 95.4 94.9 93.8  PLT 272 224 206   Basic Metabolic Panel: Recent Labs  Lab 06/10/23 1448 06/11/23 0711 06/12/23 0827 06/15/23 1631  NA 138  --  138 139  K 4.5  --  3.8 4.4  CL 99  --  97* 97*  CO2 26  --  24 26  GLUCOSE 105*  --  253* 186*  BUN 42*  --  27* 34*  CREATININE 8.37*  --  5.47* 5.79*  CALCIUM 9.3  --  10.0 10.1  MG 1.8 1.5* 2.0  --   PHOS 6.4*  --  5.3* 4.8*   GFR: Estimated Creatinine Clearance: 8.2 mL/min (A) (by C-G formula based on SCr of 5.79 mg/dL (H)). Liver Function Tests: Recent Labs   Lab 06/10/23 1448 06/12/23 0827 06/15/23 1631  AST 15  --   --   ALT 14  --   --   ALKPHOS 83  --   --   BILITOT 0.8  --   --   PROT 6.8  --   --   ALBUMIN 2.1* 2.1* 2.8*   No results for input(s): "LIPASE", "AMYLASE" in the last 168 hours. No results for input(s): "AMMONIA" in the last 168 hours. Coagulation Profile: No results for input(s): "INR", "PROTIME" in the last 168 hours. Cardiac Enzymes: No results for input(s): "CKTOTAL", "CKMB", "CKMBINDEX", "TROPONINI" in the last 168 hours. BNP (last 3 results) No results for input(s): "PROBNP" in the last 8760 hours. HbA1C: No results for input(s): "HGBA1C" in the last 72 hours. CBG: Recent Labs  Lab 06/15/23 1122 06/15/23 1616 06/16/23 0744 06/16/23 0839 06/16/23 0957  GLUCAP 241* 230* 71 69* 106*   Lipid Profile: No results for input(s): "CHOL", "HDL", "LDLCALC", "TRIG", "CHOLHDL", "LDLDIRECT" in the last 72 hours. Thyroid Function Tests: No results for input(s): "TSH", "T4TOTAL", "FREET4", "T3FREE", "THYROIDAB" in the last 72 hours. Anemia Panel: No results for input(s): "VITAMINB12", "FOLATE", "FERRITIN", "TIBC", "IRON", "RETICCTPCT" in the last 72 hours. Sepsis Labs: No results for input(s): "PROCALCITON", "LATICACIDVEN" in the last 168  hours.  No results found for this or any previous visit (from the past 240 hour(s)).       Radiology Studies: No results found.      Scheduled Meds:  aspirin EC  81 mg Oral Daily   atorvastatin  20 mg Oral Daily   Chlorhexidine Gluconate Cloth  6 each Topical Q0600   collagenase   Topical BID   darbepoetin (ARANESP) injection - DIALYSIS  60 mcg Subcutaneous Q Fri-1800   dextrose       dorzolamide-timolol  1 drop Left Eye BID   dronabinol  5 mg Oral BID AC   feeding supplement (NEPRO CARB STEADY)  237 mL Oral BID BM   haloperidol lactate  1 mg Intravenous Once   heparin  5,000 Units Subcutaneous Q8H   insulin aspart  0-6 Units Subcutaneous TID WC   lidocaine  1  patch Transdermal Q24H   midodrine  20 mg Oral TID WC   Continuous Infusions:  anticoagulant sodium citrate            Glade Lloyd, MD [facility] 06/16/2023, 10:11 AM

## 2023-06-16 NOTE — Progress Notes (Signed)
MEWS Progress Note  Patient Details Name: [name] MRN: [id number] DOB: February 02, 1981 Today's Date: 06/16/2023   MEWS Flowsheet Documentation:  Assess: MEWS Score Temp: 97.6 F (36.4 C) BP: (!) 70/48 MAP (mmHg): (!) 56 Pulse Rate: 74 ECG Heart Rate: 76 Resp: 17 Level of Consciousness: Alert SpO2: 90 % O2 Device: Nasal Cannula Patient Activity (if Appropriate): In bed O2 Flow Rate (L/min): 2 L/min Assess: MEWS Score MEWS Temp: 0 MEWS Systolic: 2 MEWS Pulse: 0 MEWS RR: 0 MEWS LOC: 0 MEWS Score: 2 MEWS Score Color: Yellow Assess: SIRS CRITERIA SIRS Temperature : 0 SIRS Respirations : 0 SIRS Pulse: 0 SIRS WBC: 0 SIRS Score Sum : 0 Assess: if the MEWS score is Yellow or Red Were vital signs accurate and taken at a resting state?: Yes Does the patient meet 2 or more of the SIRS criteria?: No Does the patient have a confirmed or suspected source of infection?: No MEWS guidelines implemented : Yes, yellow Treat MEWS Interventions: Considered administering scheduled or prn medications/treatments as ordered Take Vital Signs Increase Vital Sign Frequency : Yellow: Q2hr x1, continue Q4hrs until patient remains green for 12hrs Escalate MEWS: Escalate: Yellow: Discuss with charge nurse and consider notifying provider and/or RRT Notify: Charge Nurse/RN Name of Charge Nurse/RN Notified: Tracey Partridge, RN Provider Notification Provider Name/Title: Dr. Hanley Ben Date Provider Notified: 06/16/23 Time Provider Notified: [id number] Method of Notification: Page Notification Reason: Critical Result Provider response: Other (Comment) (MD aware)      [name] 06/16/2023, 9:25 AM

## 2023-06-16 NOTE — Plan of Care (Signed)
  Problem: Education: Goal: Knowledge of the prescribed therapeutic regimen will improve Outcome: Progressing Goal: Ability to verbalize activity precautions or restrictions will improve Outcome: Progressing Goal: Understanding of discharge needs will improve Outcome: Progressing   Problem: Clinical Measurements: Goal: Postoperative complications will be avoided or minimized Outcome: Progressing   Problem: Self-Care: Goal: Ability to meet self-care needs will improve Outcome: Progressing   Problem: Pain Management: Goal: Pain level will decrease with appropriate interventions Outcome: Progressing   Problem: Skin Integrity: Goal: Demonstration of wound healing without infection will improve Outcome: Progressing   Problem: Activity: Goal: Risk for activity intolerance will decrease Outcome: Progressing   Problem: Nutrition: Goal: Adequate nutrition will be maintained Outcome: Progressing   Problem: Coping: Goal: Level of anxiety will decrease Outcome: Progressing   Problem: Elimination: Goal: Will not experience complications related to bowel motility Outcome: Progressing   Problem: Pain Managment: Goal: General experience of comfort will improve Outcome: Progressing   Problem: Safety: Goal: Ability to remain free from injury will improve Outcome: Progressing   Problem: Skin Integrity: Goal: Risk for impaired skin integrity will decrease Outcome: Progressing   Problem: Education: Goal: Ability to describe self-care measures that may prevent or decrease complications (Diabetes Survival Skills Education) will improve Outcome: Progressing Goal: Individualized Educational Video(s) Outcome: Progressing   Problem: Coping: Goal: Ability to adjust to condition or change in health will improve Outcome: Progressing   Problem: Fluid Volume: Goal: Ability to maintain a balanced intake and output will improve Outcome: Progressing   Problem: Health Behavior/Discharge  Planning: Goal: Ability to identify and utilize available resources and services will improve Outcome: Progressing Goal: Ability to manage health-related needs will improve Outcome: Progressing   Problem: Metabolic: Goal: Ability to maintain appropriate glucose levels will improve Outcome: Progressing   Problem: Nutritional: Goal: Maintenance of adequate nutrition will improve Outcome: Progressing Goal: Progress toward achieving an optimal weight will improve Outcome: Progressing   Problem: Skin Integrity: Goal: Risk for impaired skin integrity will decrease Outcome: Progressing   Problem: Tissue Perfusion: Goal: Adequacy of tissue perfusion will improve Outcome: Progressing   Problem: Education: Goal: Knowledge of disease or condition will improve Outcome: Progressing Goal: Knowledge of secondary prevention will improve (MUST DOCUMENT ALL) Outcome: Progressing Goal: Knowledge of patient specific risk factors will improve [name] N/A or DELETE if not current risk factor) Outcome: Progressing   Problem: Ischemic Stroke/TIA Tissue Perfusion: Goal: Complications of ischemic stroke/TIA will be minimized Outcome: Progressing   Problem: Coping: Goal: Will verbalize positive feelings about self Outcome: Progressing Goal: Will identify appropriate support needs Outcome: Progressing   Problem: Health Behavior/Discharge Planning: Goal: Ability to manage health-related needs will improve Outcome: Progressing Goal: Goals will be collaboratively established with patient/family Outcome: Progressing   Problem: Self-Care: Goal: Ability to participate in self-care as condition permits will improve Outcome: Progressing Goal: Verbalization of feelings and concerns over difficulty with self-care will improve Outcome: Progressing Goal: Ability to communicate needs accurately will improve Outcome: Progressing   Problem: Nutrition: Goal: Risk of aspiration will decrease Outcome:  Progressing Goal: Dietary intake will improve Outcome: Progressing

## 2023-06-16 NOTE — Progress Notes (Signed)
Daily Progress Note   Patient Name: [name]       Date: 06/16/2023 DOB: [birth year]  Age: 42 y.o. MRN#: [id number] Attending Physician: Glade Lloyd, MD Primary Care Physician: Inc, [facility] Admit Date: 05/27/2023  Reason for Consultation/Follow-up: Establishing goals of care  Subjective: Medical records reviewed including progress notes, labs and imaging. Patient assessed at the bedside. She was resting comfortably. Did not attempt to arouse in order to preserve comfort. Noted patient too hypotensive for HD today.  Attempted to call patient's sister to re-schedule family meeting but was unable to reach or leave a voicemail.  Questions and concerns addressed. PMT will continue to support holistically.   Length of Stay: 20  Physical Exam Vitals and nursing note reviewed.  Constitutional:      General: She is not in acute distress.    Comments: Frail appearing  Cardiovascular:     Rate and Rhythm: Normal rate.  Pulmonary:     Effort: No respiratory distress.  Skin:    General: Skin is warm and dry.  Neurological:     Comments: asleep            Vital Signs: BP (!) 80/48 (BP Location: Left Arm)   Pulse 80   Temp 97.7 F (36.5 C) (Oral)   Resp 17   Ht 5' 0.98" (1.549 m)   Wt 40.4 kg   SpO2 90%   BMI 16.83 kg/m  SpO2: SpO2: 90 % O2 Device: O2 Device: Room Air O2 Flow Rate: O2 Flow Rate (L/min): 2 L/min       Palliative Assessment/Data: PPS 30%   Palliative Care Assessment & Plan   Patient Profile: 42 y.o. female  with past medical history of insulin-dependent diabetes mellitus, resultant end-stage renal disease, bilateral AKA, blindness and severe peripheral arterial disease admitted on 05/27/2023 with hypotension in dialysis.    She had  a recent prolonged hospital stay in September 2024 for small bowel ischemia with resultant small bowel obstruction. She had an extended ileal cecotomy placed. She was discharged to SNF 3 days before this admission.  Per chart, patient was not allowing dressing changes for her abdominal wound.  She is now admitted with septic shock secondary to suspected wound infection and acute encephalopathy.  Assessment: Principal Problem:   Septic shock (HCC)   Concern about end of life  Recommendations/Plan: Continue full code/full scope for now - sister was considering hospice and DNR/DNI but wished to speak with patient first Unfortunately, sister remains difficult to get in touch with after several attempts. She recently missed a scheduled family meeting Agree with appropriateness of comfort-focused care in this difficult situation. Patient unable to decide for herself at this time Continue efforts to discuss GOC PMT will continue to follow and support holistically   Prognosis: Poor long term given several chronic comorbidities and repeat hospitalizations   Discharge Planning: To Be Determined  Total time: I spent 25 minutes in the care of the patient today in the above activities and documenting the encounter.    Richardson Dopp, PA-C Palliative Medicine Team Team phone # [phone]  Thank you for allowing the Palliative Medicine Team to assist in the care of this  patient. Please utilize secure chat with additional questions, if there is no response within 30 minutes please call the above phone number.  Palliative Medicine Team providers are available by phone from 7am to 7pm daily and can be reached through the team cell phone.  Should this patient require assistance outside of these hours, please call the patient's attending physician.  Portions of this note are a verbal dictation therefore any spelling and/or grammatical errors are due to the "[employer]" system  interpretation.

## 2023-06-16 NOTE — Progress Notes (Signed)
MEWS Progress Note  Patient Details Name: [name] MRN: [id number] DOB: 03-03-1981 Today's Date: 06/16/2023   MEWS Flowsheet Documentation:  Assess: MEWS Score Temp: 97.6 F (36.4 C) BP: (!) 76/53 MAP (mmHg): (!) 62 Pulse Rate: 100 ECG Heart Rate: 76 Resp: 16 Level of Consciousness: Alert SpO2: 100 % O2 Device: Room Air Patient Activity (if Appropriate): In bed O2 Flow Rate (L/min): 2 L/min Assess: MEWS Score MEWS Temp: 0 MEWS Systolic: 2 MEWS Pulse: 0 MEWS RR: 0 MEWS LOC: 0 MEWS Score: 2 MEWS Score Color: Yellow Assess: SIRS CRITERIA SIRS Temperature : 0 SIRS Respirations : 0 SIRS Pulse: 1 SIRS WBC: 0 SIRS Score Sum : 1 SIRS Temperature : 0 SIRS Pulse: 1 SIRS Respirations : 0 SIRS WBC: 0 SIRS Score Sum : 1 Assess: if the MEWS score is Yellow or Red Were vital signs accurate and taken at a resting state?: Yes Does the patient meet 2 or more of the SIRS criteria?: No Does the patient have a confirmed or suspected source of infection?: No MEWS guidelines implemented : No, previously yellow, continue vital signs every 4 hours        Baldwin [place] 06/16/2023, 10:49 PM

## 2023-06-16 NOTE — Progress Notes (Addendum)
[facility] Progress Note   Subjective: Refused HD 11/01, 06/15/2023 today with BP 70/40. Increased midodrine dose.   Objective Vitals:   06/15/23 1617 06/15/23 1953 06/16/23 0335 06/16/23 0844  BP: 98/66 (!) 105/46 97/70 (!) 70/48  Pulse: (!) 103  74   Resp: 16 18 17    Temp: 98.2 F (36.8 C) 97.8 F (36.6 C) 97.6 F (36.4 C)   TempSrc: Oral Oral Oral   SpO2: 90% (!) 88% 90%   Weight:      Height:       Physical Exam General: Chronically ill appearing female in NAD Heart: S1,S2 RRR No M/R/G Lungs: CTAB Abdomen: Abdominal drsg intact. Abdominal drain with dark green drainage. Active BS.  Extremities: Bilateral AKA. No stump edema Dialysis Access: R AVF + T/B  Additional Objective Labs: Basic Metabolic Panel: Recent Labs  Lab 06/10/23 1448 06/12/23 0827 06/15/23 1631  NA 138 138 139  K 4.5 3.8 4.4  CL 99 97* 97*  CO2 26 24 26   GLUCOSE 105* 253* 186*  BUN 42* 27* 34*  CREATININE 8.37* 5.47* 5.79*  CALCIUM 9.3 10.0 10.1  PHOS 6.4* 5.3* 4.8*   Liver Function Tests: Recent Labs  Lab 06/10/23 1448 06/12/23 0827 06/15/23 1631  AST 15  --   --   ALT 14  --   --   ALKPHOS 83  --   --   BILITOT 0.8  --   --   PROT 6.8  --   --   ALBUMIN 2.1* 2.1* 2.8*   No results for input(s): "LIPASE", "AMYLASE" in the last 168 hours. CBC: Recent Labs  Lab 06/10/23 1448 06/11/23 0711 06/15/23 1631  WBC 10.1 9.8 11.8*  HGB 11.5* 12.8 12.4  HCT 37.3 42.7 40.7  MCV 95.4 94.9 93.8  PLT 272 224 206   Blood Culture    Component Value Date/Time   SDES BLOOD LEFT ARM 05/27/2023 1527   SPECREQUEST  05/27/2023 1527    BOTTLES DRAWN AEROBIC AND ANAEROBIC Blood Culture adequate volume   CULT  05/27/2023 1527    NO GROWTH 5 DAYS Performed at [facility], [address], [place], [place] [address]    REPTSTATUS 06/01/2023 FINAL 05/27/2023 1527    Cardiac Enzymes: No results for input(s): "CKTOTAL", "CKMB", "CKMBINDEX", "TROPONINI" in the last 168  hours. CBG: Recent Labs  Lab 06/15/23 0751 06/15/23 1122 06/15/23 1616 06/16/23 0744 06/16/23 0839  GLUCAP 279* 241* 230* 71 69*   Iron Studies: No results for input(s): "IRON", "TIBC", "TRANSFERRIN", "FERRITIN" in the last 72 hours. @lablastinr3 @ Studies/Results: No results found. Medications:  anticoagulant sodium citrate      aspirin EC  81 mg Oral Daily   atorvastatin  20 mg Oral Daily   Chlorhexidine Gluconate Cloth  6 each Topical Q0600   collagenase   Topical BID   darbepoetin (ARANESP) injection - DIALYSIS  60 mcg Subcutaneous Q Fri-1800   dextrose       dorzolamide-timolol  1 drop Left Eye BID   dronabinol  5 mg Oral BID AC   feeding supplement (NEPRO CARB STEADY)  237 mL Oral BID BM   haloperidol lactate  1 mg Intravenous Once   heparin  5,000 Units Subcutaneous Q8H   insulin aspart  0-6 Units Subcutaneous TID WC   lidocaine  1 patch Transdermal Q24H   midodrine  10 mg Oral TID WC     Dialysis Orders: [facility] MWF  400/1.5 auto, 2K/2Ca, EDW 49kg, mircera 75 q2wks  Assessment/Plan: 1. Shock: presumed septic, now off pressors, on midodrine. Suspected related to abd wound.  S/p abx, per primary   2. 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 Necrotic tissue noted in wound and around ileostomy.  WC and surgery following.     3. 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  CTA completed. EEG with no seizure like activity. Per Neuro.   4. ESRD - MWF schedule dialysis on schedule. Refused HD 06/14/2023. Refused HD again 06/15/2023. Attempt HD again today 06/16/2023 however SBP 70s.  Checking RFP. Marland Kitchen See # 10  K+ 5.2 [place] 10 grams po today.    5. Hypotension/volume - Hypotensive, on midodrine. Pain medication probably contributing to hypotension. Increase midodrine to 20 mg PO TID. HR 70s. Use Albumin with HD PRN. Fortunately volume status is OK.    6. Anemia of CKD- Hgb now 12.4- continue Aranesp q Fri for now but will stop if Hgb  continues to trend up.   7. Secondary hyperparathyroidism - CorrCa high. Binders on hold. Senispar on hold d/t GI issues. Not on VDRA.  Repeat PTH and Phos.   8. Nutrition - Alb 2.4, continue supplements   9. AVF-S/p fgram 10/23 with IR: multiple areas of narrowing, s/p balloon angioplasty.    10.GOC: poor prognosis, unable to dialyze safely with low BP's in the 70s- 80s. Hopefully will improve.   Rita H. Brown NP-C 06/16/2023, 9:53 AM  [facility] [phone]  Pt seen, examined and agree w A/P as above.  Vinson Moselle MD  [facility] 06/16/2023, 4:55 PM

## 2023-06-16 NOTE — Plan of Care (Signed)
  Problem: Pain Management: Goal: Pain level will decrease with appropriate interventions Outcome: Progressing   Problem: Skin Integrity: Goal: Demonstration of wound healing without infection will improve Outcome: Progressing   Problem: Nutrition: Goal: Adequate nutrition will be maintained Outcome: Progressing   Problem: Elimination: Goal: Will not experience complications related to bowel motility Outcome: Progressing   Problem: Pain Managment: Goal: General experience of comfort will improve Outcome: Progressing   Problem: Skin Integrity: Goal: Risk for impaired skin integrity will decrease Outcome: Progressing   Problem: Skin Integrity: Goal: Risk for impaired skin integrity will decrease Outcome: Progressing

## 2023-06-16 NOTE — Progress Notes (Signed)
Patient blood pressure 70/48 after three rechecks and midodrine administration. Patient alert no change in LOC. Patient also had CBG of 71 then 69. Hypoglycemia protocol initiated dextrose 12.5 administered via IV pending recheck, Glade Lloyd, MD made aware.

## 2023-06-16 NOTE — Progress Notes (Signed)
Observed R AC with exposed wire/catheter and dressing rolling up, replaced gauze dressing & covered withTegaderm.

## 2023-06-16 NOTE — Progress Notes (Signed)
Called to get dialysis report was told the nurse will call back

## 2023-06-16 NOTE — Progress Notes (Signed)
Called for dialysis report spoke with Myrlene Broker, RN she informed of pt low bp. Bp to low to perform HD at this time Alonna Buckler, NP aware

## 2023-06-17 DIAGNOSIS — Z66 Do not resuscitate: Secondary | ICD-10-CM

## 2023-06-17 DIAGNOSIS — R6521 Severe sepsis with septic shock: Secondary | ICD-10-CM | POA: Diagnosis not present

## 2023-06-17 DIAGNOSIS — A419 Sepsis, unspecified organism: Secondary | ICD-10-CM | POA: Diagnosis not present

## 2023-06-17 DIAGNOSIS — I959 Hypotension, unspecified: Secondary | ICD-10-CM | POA: Diagnosis not present

## 2023-06-17 DIAGNOSIS — Z515 Encounter for palliative care: Secondary | ICD-10-CM | POA: Diagnosis not present

## 2023-06-17 DIAGNOSIS — N186 End stage renal disease: Secondary | ICD-10-CM | POA: Diagnosis not present

## 2023-06-17 LAB — GLUCOSE, CAPILLARY
Glucose-Capillary: 128 mg/dL — ABNORMAL HIGH (ref 70–99)
Glucose-Capillary: 211 mg/dL — ABNORMAL HIGH (ref 70–99)
Glucose-Capillary: 133 mg/dL — ABNORMAL HIGH (ref 70–99)

## 2023-06-17 LAB — COMPREHENSIVE METABOLIC PANEL
ALT: 12 U/L (ref 0–44)
AST: 21 U/L (ref 15–41)
Albumin: 2.8 g/dL — ABNORMAL LOW (ref 3.5–5.0)
Alkaline Phosphatase: 104 U/L (ref 38–126)
Anion gap: 23 — ABNORMAL HIGH (ref 5–15)
BUN: 56 mg/dL — ABNORMAL HIGH (ref 6–20)
CO2: 19 mmol/L — ABNORMAL LOW (ref 22–32)
Calcium: 10.5 mg/dL — ABNORMAL HIGH (ref 8.9–10.3)
Chloride: 97 mmol/L — ABNORMAL LOW (ref 98–111)
Creatinine, Ser: 7.73 mg/dL — ABNORMAL HIGH (ref 0.44–1.00)
GFR, Estimated: 6 mL/min — ABNORMAL LOW (ref >60)
Glucose, Bld: 217 mg/dL — ABNORMAL HIGH (ref 70–99)
Potassium: 5.6 mmol/L — ABNORMAL HIGH (ref 3.5–5.1)
Sodium: 139 mmol/L (ref 135–145)
Total Bilirubin: 1 mg/dL (ref <1.2)
Total Protein: 7.7 g/dL (ref 6.5–8.1)

## 2023-06-17 LAB — CBC
HCT: 39.8 % (ref 36.0–46.0)
Hemoglobin: 12 g/dL (ref 12.0–15.0)
MCH: 28.6 pg (ref 26.0–34.0)
MCHC: 30.2 g/dL (ref 30.0–36.0)
MCV: 95 fL (ref 80.0–100.0)
Platelets: 222 10*3/uL (ref 150–400)
RBC: 4.19 MIL/uL (ref 3.87–5.11)
RDW: 21.2 % — ABNORMAL HIGH (ref 11.5–15.5)
WBC: 14.6 10*3/uL — ABNORMAL HIGH (ref 4.0–10.5)
nRBC: 0.5 % — ABNORMAL HIGH (ref 0.0–0.2)

## 2023-06-17 MED ORDER — BIOTENE DRY MOUTH MT LIQD
15.0000 mL | Freq: Two times a day (BID) | OROMUCOSAL | Status: DC
  Administered 2023-06-17 – 2023-06-20 (×6): 15 mL via TOPICAL

## 2023-06-17 MED ORDER — ACETAMINOPHEN 650 MG RE SUPP: 650.0000 mg | Freq: Four times a day (QID) | RECTAL | Status: DC | PRN

## 2023-06-17 MED ORDER — ONDANSETRON HCL 4 MG/2ML IJ SOLN: 4.0000 mg | Freq: Four times a day (QID) | INTRAMUSCULAR | Status: DC | PRN

## 2023-06-17 MED ORDER — LORAZEPAM 2 MG/ML PO CONC
1.0000 mg | ORAL | Status: DC | PRN
Start: 2023-06-17 — End: 2023-06-21

## 2023-06-17 MED ORDER — HYDROMORPHONE HCL 1 MG/ML IJ SOLN
0.5000 mg | INTRAMUSCULAR | Status: DC | PRN
Start: 2023-06-17 — End: 2023-06-21
  Administered 2023-06-18 – 2023-06-19 (×2): 1 mg via INTRAVENOUS
  Filled 2023-06-17 (×3): qty 1

## 2023-06-17 MED ORDER — HALOPERIDOL LACTATE 5 MG/ML IJ SOLN: 2.0000 mg | Freq: Four times a day (QID) | INTRAMUSCULAR | Status: DC | PRN

## 2023-06-17 MED ORDER — ACETAMINOPHEN 325 MG PO TABS: 650.0000 mg | ORAL_TABLET | Freq: Four times a day (QID) | ORAL | Status: DC | PRN

## 2023-06-17 MED ORDER — POLYVINYL ALCOHOL 1.4 % OP SOLN: 1.0000 [drp] | Freq: Four times a day (QID) | OPHTHALMIC | Status: DC | PRN

## 2023-06-17 MED ORDER — GLYCOPYRROLATE 0.2 MG/ML IJ SOLN: 0.2000 mg | INTRAMUSCULAR | Status: DC | PRN

## 2023-06-17 MED ORDER — HALOPERIDOL LACTATE 2 MG/ML PO CONC: 2.0000 mg | Freq: Four times a day (QID) | ORAL | Status: DC | PRN

## 2023-06-17 MED ORDER — HALOPERIDOL 1 MG PO TABS: 2.0000 mg | ORAL_TABLET | Freq: Four times a day (QID) | ORAL | Status: DC | PRN

## 2023-06-17 MED ORDER — GLYCOPYRROLATE 1 MG PO TABS: 1.0000 mg | ORAL_TABLET | ORAL | Status: DC | PRN

## 2023-06-17 MED ORDER — NEPRO/CARBSTEADY PO LIQD: 237.0000 mL | ORAL | Status: DC | PRN

## 2023-06-17 MED ORDER — ONDANSETRON 4 MG PO TBDP: 4.0000 mg | ORAL_TABLET | Freq: Four times a day (QID) | ORAL | Status: DC | PRN

## 2023-06-17 MED ORDER — LORAZEPAM 2 MG/ML IJ SOLN
1.0000 mg | INTRAMUSCULAR | Status: DC | PRN
Start: 2023-06-17 — End: 2023-06-21

## 2023-06-17 MED ORDER — DIPHENHYDRAMINE HCL 50 MG/ML IJ SOLN: 12.5000 mg | INTRAMUSCULAR | Status: DC | PRN

## 2023-06-17 MED ORDER — LORAZEPAM 1 MG PO TABS
1.0000 mg | ORAL_TABLET | ORAL | Status: DC | PRN
Start: 2023-06-17 — End: 2023-06-21

## 2023-06-17 NOTE — TOC Progression Note (Signed)
Transition of Care [facility]) - Progression Note    Patient Details  Name: [name] MRN: [id number] Date of Birth: [birth year]  Transition of Care [facility]) CM/SW Contact  Gordy Clement, RN Phone Number: 06/17/2023, 12:46 PM  Clinical Narrative:     Referral made to Chaplaincy for Sister to get assistance with EOL planning for patient      Barriers to Discharge: Continued Medical Work up  Expected Discharge Plan and Services     Post Acute Care Choice: Skilled Nursing Facility Living arrangements for the past 2 months: Skilled Nursing Facility Expected Discharge Date: 06/12/23                                     Social Determinants of Health (SDOH) Interventions SDOH Screenings   Food Insecurity: No Food Insecurity (06/07/2023)  Housing: Patient Unable To Answer (06/07/2023)  Transportation Needs: No Transportation Needs (06/11/2023)  Utilities: Not At Risk (06/11/2023)  Depression (PHQ2-9): Low Risk  (05/11/2020)  Tobacco Use: Low Risk  (06/07/2023)    Readmission Risk Interventions    07/04/2022   10:49 AM 08/23/2021    3:42 PM  Readmission Risk Prevention Plan  Transportation Screening Complete Complete  HRI or Home Care Consult  Complete  Social Work Consult for Recovery Care Planning/Counseling  Complete  Palliative Care Screening  Not Applicable  Medication Review Oceanographer) Complete Complete  PCP or Specialist appointment within 3-5 days of discharge Complete   HRI or Home Care Consult Complete   SW Recovery Care/Counseling Consult Complete   Palliative Care Screening Not Applicable   Skilled Nursing Facility Complete

## 2023-06-17 NOTE — Progress Notes (Signed)
Pt is running Yellow MEWS. BP 70/53. Alekh,MD and Schertz,MD (nephrology) made aware.

## 2023-06-17 NOTE — Progress Notes (Signed)
MEWS Progress Note  Patient Details Name: Wm Fruchter MRN: [id number] DOB: [birth year] Today's Date: 06/17/2023   MEWS Flowsheet Documentation:  Assess: MEWS Score Temp: 97.6 F (36.4 C) BP: (!) 69/53 MAP (mmHg): (!) 59 Pulse Rate: 96 ECG Heart Rate: 76 Resp: 18 Level of Consciousness: Alert SpO2: (!) 88 % O2 Device: Room Air Patient Activity (if Appropriate): In bed O2 Flow Rate (L/min): 2 L/min Assess: MEWS Score MEWS Temp: 0 MEWS Systolic: 3 MEWS Pulse: 0 MEWS RR: 0 MEWS LOC: 0 MEWS Score: 3 MEWS Score Color: Yellow Assess: SIRS CRITERIA SIRS Temperature : 0 SIRS Respirations : 0 SIRS Pulse: 1 SIRS WBC: 0 SIRS Score Sum : 1 SIRS Temperature : 0 SIRS Pulse: 1 SIRS Respirations : 0 SIRS WBC: 0 SIRS Score Sum : 1 Assess: if the MEWS score is Yellow or Red Were vital signs accurate and taken at a resting state?: Yes Does the patient meet 2 or more of the SIRS criteria?: No Does the patient have a confirmed or suspected source of infection?: No MEWS guidelines implemented : No, previously yellow, continue vital signs every 4 hours        Baldwin Jamaica 06/17/2023, 3:54 AM

## 2023-06-17 NOTE — Progress Notes (Signed)
20mg  Midodrine given per Alekh,MD

## 2023-06-17 NOTE — Progress Notes (Signed)
[facility] Progress Note   Subjective:   Hypotensive again, given midodrine around 7:45AM. Patient in bed, alert, repeats "My back hurts! Give me a pill!" And does not answer any other ROS questions. Noted nasal canula was pulled off and replaced it.   Objective Vitals:   06/16/23 2244 06/17/23 0348 06/17/23 0633 06/17/23 0724  BP: (!) 76/53 (!) 69/53 (!) 76/62 (!) 70/53  Pulse: 100 96  79  Resp: 16 18 18 16   Temp: 97.6 F (36.4 C) 97.6 F (36.4 C) 97.6 F (36.4 C) (!) 97.2 F (36.2 C)  TempSrc: Oral Oral Oral   SpO2: 100% (!) 88% (!) 84%   Weight:      Height:       Physical Exam General: Chronically ill appearing female, alert Heart: RRR, no murmurs, rubs or gallops Lungs: Poor cooperation with exam, CTA anteriorly Abdomen: +BS Extremities: No edema b/l lower extremities Dialysis Access: RUE AVF +Bruit  Additional Objective Labs: Basic Metabolic Panel: Recent Labs  Lab 06/12/23 0827 06/15/23 1631 06/16/23 1023  NA 138 139 139  K 3.8 4.4 5.2*  CL 97* 97* 98  CO2 24 26 18*  GLUCOSE 253* 186* 106*  BUN 27* 34* 47*  CREATININE 5.47* 5.79* 6.53*  CALCIUM 10.0 10.1 10.6*  PHOS 5.3* 4.8* 5.8*   Liver Function Tests: Recent Labs  Lab 06/10/23 1448 06/12/23 0827 06/15/23 1631 06/16/23 1023  AST 15  --   --   --   ALT 14  --   --   --   ALKPHOS 83  --   --   --   BILITOT 0.8  --   --   --   PROT 6.8  --   --   --   ALBUMIN 2.1* 2.1* 2.8* 2.7*   No results for input(s): "LIPASE", "AMYLASE" in the last 168 hours. CBC: Recent Labs  Lab 06/10/23 1448 06/11/23 0711 06/15/23 1631 06/16/23 1023  WBC 10.1 9.8 11.8* 14.8*  HGB 11.5* 12.8 12.4 12.2  HCT 37.3 42.7 40.7 40.1  MCV 95.4 94.9 93.8 95.2  PLT 272 224 206 226   Blood Culture    Component Value Date/Time   SDES BLOOD LEFT ARM 05/27/2023 1527   SPECREQUEST  05/27/2023 1527    BOTTLES DRAWN AEROBIC AND ANAEROBIC Blood Culture adequate volume   CULT  05/27/2023 1527    NO GROWTH 5  DAYS Performed at [facility], [address], [place], [place] [address]    REPTSTATUS 06/01/2023 FINAL 05/27/2023 1527    Cardiac Enzymes: No results for input(s): "CKTOTAL", "CKMB", "CKMBINDEX", "TROPONINI" in the last 168 hours. CBG: Recent Labs  Lab 06/16/23 0957 06/16/23 1133 06/16/23 1634 06/16/23 2208 06/17/23 0810  GLUCAP 106* 119* 182* 178* 211*   Iron Studies: No results for input(s): "IRON", "TIBC", "TRANSFERRIN", "FERRITIN" in the last 72 hours. @lablastinr3 @ Studies/Results: No results found. Medications:  anticoagulant sodium citrate      aspirin EC  81 mg Oral Daily   atorvastatin  20 mg Oral Daily   Chlorhexidine Gluconate Cloth  6 each Topical Q0600   collagenase   Topical BID   darbepoetin (ARANESP) injection - DIALYSIS  60 mcg Subcutaneous Q Fri-1800   dorzolamide-timolol  1 drop Left Eye BID   dronabinol  5 mg Oral BID AC   feeding supplement (NEPRO CARB STEADY)  237 mL Oral BID BM   haloperidol lactate  1 mg Intravenous Once   heparin  5,000 Units Subcutaneous Q8H  insulin aspart  0-6 Units Subcutaneous TID WC   lidocaine  1 patch Transdermal Q24H   midodrine  20 mg Oral TID WC    Dialysis Orders: [facility] MWF  400/1.5 auto, 2K/2Ca, EDW 49kg, mircera 75 q2wks   Assessment/Plan: 1. Shock: presumed septic, now off pressors but still quite hypotensive, on high dose midodrine. Suspected related to abd wound.  S/p abx, per primary   2. 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 Necrotic tissue noted in wound and around ileostomy.  WC and surgery following.     3. 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  CTA completed. EEG with no seizure like activity. Per Neuro.    4. ESRD - MWF schedule dialysis on schedule. Refused HD 06/14/2023. Refused HD again 06/15/2023. Attempted HD again today 06/16/2023 however SBP 70s so did not get HD. On schedule for HD today.    5. Hypotension/volume - Hypotensive, on midodrine. Pain  medication probably contributing to hypotension. Increased midodrine to 20 mg PO TID. HR 70s. Have been giving intermittent IVF but continues to have large amount of stool output with no clear solution. Use Albumin with HD PRN.    6. Anemia of CKD- Hgb now 12- continue Aranesp q Fri for now but will stop if Hgb continues to trend up.   7. Secondary hyperparathyroidism - CorrCa high. Phos 5.8- follow trend for now, Senispar and binders on hold d/t GI issues. Not on VDRA.     8. Nutrition - Alb 2.7- slightly improved, continue supplements   9. AVF-S/p fgram 10/23 with IR: multiple areas of narrowing, s/p balloon angioplasty.    10.GOC: poor prognosis, unable to dialyze safely with low BP's in the 70s- 80s. Hopefully will improve. She appears to be chronically uncomfortable and in pain as well. Palliative care on board, patient's sister has decided to transition to comfort care. Will cancel HD order for today.    Rogers Blocker, PA-C 06/17/2023, 8:42 AM  [facility] Pager: [phone]

## 2023-06-17 NOTE — Consult Note (Signed)
WOC Nurse wound follow up: At bedside with Nehemiah Settle, CCS PA. Pouch is intact, patient is refusing care stating it is "fine"  We assess midline incision and will switch to cleansing with VASHE, santyl to wound bed and VASHE moist gauze to wound bed daily. Wednesday we will change EAKin pouch. Made patient aware and she is in agreement with this.  Will follow.  Mike Gip MSN, RN, FNP-BC CWON Wound, Ostomy, Continence Nurse [facility]patient [facility] [phone] Pager [phone]

## 2023-06-17 NOTE — Progress Notes (Signed)
MEWS Progress Note  Patient Details Name: Oliveah Zwack MRN: [id number] DOB: 1980-08-30 Today's Date: 06/17/2023   MEWS Flowsheet Documentation:  Assess: MEWS Score Temp: 97.6 F (36.4 C) BP: (!) 76/62 MAP (mmHg): (!) 59 Pulse Rate: 96 ECG Heart Rate: 76 Resp: 18 Level of Consciousness: Alert SpO2: (!) 84 % O2 Device: Room Air Patient Activity (if Appropriate): In bed O2 Flow Rate (L/min): 2 L/min Assess: MEWS Score MEWS Temp: 0 MEWS Systolic: 2 MEWS Pulse: 0 MEWS RR: 0 MEWS LOC: 0 MEWS Score: 2 MEWS Score Color: Yellow Assess: SIRS CRITERIA SIRS Temperature : 0 SIRS Respirations : 0 SIRS Pulse: 1 SIRS WBC: 0 SIRS Score Sum : 1 SIRS Temperature : 0 SIRS Pulse: 1 SIRS Respirations : 0 SIRS WBC: 0 SIRS Score Sum : 1 Assess: if the MEWS score is Yellow or Red Were vital signs accurate and taken at a resting state?: Yes Does the patient meet 2 or more of the SIRS criteria?: No Does the patient have a confirmed or suspected source of infection?: No MEWS guidelines implemented : No, previously yellow, continue vital signs every 4 hours        Baldwin Jamaica 06/17/2023, 6:35 AM

## 2023-06-17 NOTE — Progress Notes (Signed)
PROGRESS NOTE    [name]  XLK:[id number] DOB: 10/14/1980 DOA: 05/27/2023 PCP: [facility]   Brief Narrative:  42 year old with history of DM2, ESRD, bilateral AKA, blindness, PAD initially presented with hypotension from dialysis.  Had a recent prolonged stay in the hospital in September 2024 for small bowel ischemia complicated by SBO and had extended ileocecectomy and discharged to SNF.  Patient has had HD catheter in the right chest wall due to difficulty accessing her AV fistula.  Eventually at SNF found to be also hypotensive during her HD therefore admitted to the ICU and PCCM was initially consulted.  Eventually she was weaned off pressors on 10/16 and transferred to [facility].   Eventually her fistula was evaluated which was functioning adequately per IR.  She tolerated multiple HD sessions without any issues.  For reduced EF, cardiology saw the patient and she was deemed to be a poor candidate for any further aggressive workup and recommended outpatient follow-up.   Fistula seems to be working well, currently waiting for her to able to get dialyzed in chair. Unfortunately she has been refusing HD at times. Palliative in touch with the sister, who is considering Hospice/comfort.   Assessment & Plan:   Goals of care discussion  -Difficulty getting touch with patient's healthcare power of attorney, [name] sister.  Ethics was consulted. Ok to contact "next of kin" who is her husband.  -Palliative care following.  Palliative care was supposed to have family meeting on 06/15/2023 and 06/16/2023 but was unable to reach hysterogram. -Overall prognosis is very poor.  Nephrology recommending transitioning to comfort measures  new onset congestive heart failure with reduced EF 25% -Echocardiogram 2023 had preserved EF, echo done on 05/29/2023 showed EF of 25%.  New change, seen by cardiology but due to multiple comorbidities, continue current management.  No further  intervention planned. -Strict input and output.  Daily weights.  Volume managed by dialysis intermittently by nephrology.   End-stage renal disease, likely diabetic nephropathy HD per nephrology.  AVF versus use of HD catheter per nephrology Status post IR right arm fistulogram with balloon angioplasty Needs to try hemodialysis in chair. Occasionally refusing HD: Has refused hemodialysis on 06/14/2023 and 06/15/23.    Septic Shock secondary to suspected wound/skin/soft tissue infection, POA Acute metabolic encephalopathy Hypotension -Blood pressure has intermittently remained extremely low and dropping down 70s.  Continue midodrine.  Nephrology following. -Mental status still waxing and waning.  Monitor mental status.  Fall precautions.   Type 2 diabetes with hyperglycemia and hypoglycemia Peripheral vascular disease Bilateral AKA -Continue CBGs with SSI.  Punctate acute infarct R frontal love, acute to subacute infarct right parieto occipital infarct: -EEG negative, seen by neurology.  LDL 19, A1c 5.7. -Continue aspirin and statin.  Outpatient follow-up with neurology.  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  General surgery following intermittently.  Leukocytosis -No labs today.  Monitor  Hyperkalemia -Mild.  No labs today.  Monitor  Acute metabolic acidosis--no labs today.  Monitor  Stage II pressure injury of left buttock: Present on admission Stage I sacral pressure injury: Present on admission -Continue local wound care.  Follow wound care consult recommendations  DVT prophylaxis: Heparin subcutaneous Code Status: Full Family Communication: None at bedside Disposition Plan: Status is: Inpatient Remains inpatient appropriate because: Of severity of illness  Consultants: Nephrology/neurology/general  surgery/palliative care/cardiology/ethics  Procedures: As above  Antimicrobials: None   Subjective: Patient seen and examined at bedside.  Poor historian.  Looks very drowsy.  No agitation, seizures, vomiting reported.  Nursing staff reports continued hypotension. Objective: Vitals:   06/16/23 2244 06/17/23 0348 06/17/23 0633 06/17/23 0724  BP: (!) 76/53 (!) 69/53 (!) 76/62 (!) 70/53  Pulse: 100 96  79  Resp: 16 18 18 16   Temp: 97.6 F (36.4 C) 97.6 F (36.4 C) 97.6 F (36.4 C) (!) 97.2 F (36.2 C)  TempSrc: Oral Oral Oral   SpO2: 100% (!) 88% (!) 84%   Weight:      Height:        Intake/Output Summary (Last 24 hours) at 06/17/2023 0801 Last data filed at 06/17/2023 0345 Gross per 24 hour  Intake --  Output 300 ml  Net -300 ml   Filed Weights   06/13/23 0622 06/14/23 0026 06/14/23 0500  Weight: 40.8 kg 40.1 kg 40.4 kg    Examination:  General: On room air currently.  No distress.  Chronically ill and deconditioned looking. ENT/neck: No thyromegaly.  JVD is not elevated  respiratory: Decreased breath sounds at bases bilaterally with some crackles; no wheezing  CVS: S1-S2 heard, rate controlled Abdominal: Soft, slightly tender, slightly distended; no organomegaly.  Abdominal dressing present along with ileostomy  Extremities: Bilateral AKA present CNS: Extremely poor historian.  Looks very drowsy, wakes up slightly, extremely slow to respond.  Lymph: No obvious lymphadenopathy Skin: No obvious ecchymosis/lesions  psych: Extremely flat affect.  Insight and judgment appear very poor.  Not agitated currently.   Musculoskeletal: No obvious other joint swelling/deformity    Data Reviewed: I have personally reviewed following labs and imaging studies  CBC: Recent Labs  Lab 06/10/23 1448 06/11/23 0711 06/15/23 1631 06/16/23 1023  WBC 10.1 9.8 11.8* 14.8*  HGB 11.5* 12.8 12.4 12.2  HCT 37.3 42.7 40.7 40.1  MCV 95.4 94.9 93.8 95.2  PLT 272 224 206 226   Basic  Metabolic Panel: Recent Labs  Lab 06/10/23 1448 06/11/23 0711 06/12/23 0827 06/15/23 1631 06/16/23 1023  NA 138  --  138 139 139  K 4.5  --  3.8 4.4 5.2*  CL 99  --  97* 97* 98  CO2 26  --  24 26 18*  GLUCOSE 105*  --  253* 186* 106*  BUN 42*  --  27* 34* 47*  CREATININE 8.37*  --  5.47* 5.79* 6.53*  CALCIUM 9.3  --  10.0 10.1 10.6*  MG 1.8 1.5* 2.0  --   --   PHOS 6.4*  --  5.3* 4.8* 5.8*   GFR: Estimated Creatinine Clearance: 7.2 mL/min (A) (by C-G formula based on SCr of 6.53 mg/dL (H)). Liver Function Tests: Recent Labs  Lab 06/10/23 1448 06/12/23 0827 06/15/23 1631 06/16/23 1023  AST 15  --   --   --   ALT 14  --   --   --   ALKPHOS 83  --   --   --   BILITOT 0.8  --   --   --   PROT 6.8  --   --   --   ALBUMIN 2.1* 2.1* 2.8* 2.7*   No results for input(s): "LIPASE", "AMYLASE" in the last 168 hours. No results for input(s): "AMMONIA" in the last 168 hours. Coagulation Profile: No results for input(s): "INR", "PROTIME" in the last 168 hours. Cardiac Enzymes: No results for input(s): "CKTOTAL", "CKMB", "CKMBINDEX", "TROPONINI" in the last 168 hours.  BNP (last 3 results) No results for input(s): "PROBNP" in the last 8760 hours. HbA1C: No results for input(s): "HGBA1C" in the last 72 hours. CBG: Recent Labs  Lab 06/16/23 0839 06/16/23 0957 06/16/23 1133 06/16/23 1634 06/16/23 2208  GLUCAP 69* 106* 119* 182* 178*   Lipid Profile: No results for input(s): "CHOL", "HDL", "LDLCALC", "TRIG", "CHOLHDL", "LDLDIRECT" in the last 72 hours. Thyroid Function Tests: No results for input(s): "TSH", "T4TOTAL", "FREET4", "T3FREE", "THYROIDAB" in the last 72 hours. Anemia Panel: No results for input(s): "VITAMINB12", "FOLATE", "FERRITIN", "TIBC", "IRON", "RETICCTPCT" in the last 72 hours. Sepsis Labs: No results for input(s): "PROCALCITON", "LATICACIDVEN" in the last 168 hours.  No results found for this or any previous visit (from the past 240 hour(s)).        Radiology Studies: No results found.      Scheduled Meds:  aspirin EC  81 mg Oral Daily   atorvastatin  20 mg Oral Daily   Chlorhexidine Gluconate Cloth  6 each Topical Q0600   collagenase   Topical BID   darbepoetin (ARANESP) injection - DIALYSIS  60 mcg Subcutaneous Q Fri-1800   dorzolamide-timolol  1 drop Left Eye BID   dronabinol  5 mg Oral BID AC   feeding supplement (NEPRO CARB STEADY)  237 mL Oral BID BM   haloperidol lactate  1 mg Intravenous Once   heparin  5,000 Units Subcutaneous Q8H   insulin aspart  0-6 Units Subcutaneous TID WC   lidocaine  1 patch Transdermal Q24H   midodrine  20 mg Oral TID WC   Continuous Infusions:  anticoagulant sodium citrate            Glade Lloyd, MD [facility] 06/17/2023, 8:01 AM

## 2023-06-17 NOTE — Progress Notes (Signed)
Patient ID: [name], female   DOB: 1981/07/22, 42 y.o.   MRN: [id number] [facility] Surgery Progress Note     Subjective: CC-  Palliative having trouble reaching patient's sister again. Patient refused HD 11/1 and 11/2, unable to do HD 11/3 due to hypotension. Patient not eating much. 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 Hypotensive.  Objective: Vital signs in last 24 hours: Temp:  [97.2 F (36.2 C)-97.7 F (36.5 C)] 97.2 F (36.2 C) (11/04 0724) Pulse Rate:  [78-100] 79 (11/04 0724) Resp:  [16-18] 16 (11/04 0724) BP: (69-82)/(48-62) 70/53 (11/04 0724) SpO2:  [84 %-100 %] 84 % (11/04 5366) Last BM Date : 06/16/23  Intake/Output from previous day: 11/03 0701 - 11/04 0700 In: -  Out: 300 [Stool:300] Intake/Output this shift: No intake/output data recorded.  PE: Gen: Alert, NAD, cooperative Pulm: rate and effort normal Abd: soft, nondistended, nontender, open midline stable with fat necroses of central portion of wound and interval increase in viable pink tissue. There is a layer of fribinous exudate over fascial sutures at the base. Pouch to ileostomy not taken down today, thin green stool in bag  Lab Results:  Recent Labs    06/16/23 1023 06/17/23 0754  WBC 14.8* 14.6*  HGB 12.2 12.0  HCT 40.1 39.8  PLT 226 222   BMET Recent Labs    06/16/23 1023 06/17/23 0754  NA 139 139  K 5.2* 5.6*  CL 98 97*  CO2 18* 19*  GLUCOSE 106* 217*  BUN 47* 56*  CREATININE 6.53* 7.73*  CALCIUM 10.6* 10.5*   PT/INR No results for input(s): "LABPROT", "INR" in the last 72 hours. CMP     Component Value Date/Time   NA 139 06/17/2023 0754   NA 133 (L) 05/18/2020 0927   K 5.6 (H) 06/17/2023 0754   CL 97 (L) 06/17/2023 0754   CO2 19 (L) 06/17/2023 0754   GLUCOSE 217 (H) 06/17/2023 0754   BUN 56 (H) 06/17/2023 0754   BUN 45 (H) 05/18/2020 0927   CREATININE 7.73 (H) 06/17/2023 0754   CALCIUM 10.5 (H) 06/17/2023 0754   CALCIUM 12.1 (H) 11/13/2021 2026   PROT 7.7  06/17/2023 0754   PROT 7.6 05/18/2020 0927   ALBUMIN 2.8 (L) 06/17/2023 0754   ALBUMIN 3.9 05/18/2020 0927   AST 21 06/17/2023 0754   ALT 12 06/17/2023 0754   ALKPHOS 104 06/17/2023 0754   BILITOT 1.0 06/17/2023 0754   BILITOT <0.2 05/18/2020 0927   GFRNONAA 6 (L) 06/17/2023 0754   GFRAA 6 (L) 05/18/2020 0927   Lipase     Component Value Date/Time   LIPASE 55 (H) 05/25/2023 1159       Studies/Results: No results found.  Anti-infectives: Anti-infectives (From admission, onward)    Start     Dose/Rate Route Frequency Ordered Stop   05/31/23 2000  vancomycin (VANCOREADY) IVPB 500 mg/100 mL        500 mg 100 mL/hr over 60 Minutes Intravenous Once per day on Monday Wednesday Friday 05/31/23 1133 06/03/23 1706   05/29/23 1200  vancomycin (VANCOREADY) IVPB 500 mg/100 mL  Status:  Discontinued        500 mg 100 mL/hr over 60 Minutes Intravenous Every M-W-F (Hemodialysis) 05/28/23 0924 05/31/23 1133   05/28/23 1500  ceFEPIme (MAXIPIME) 2 g in sodium chloride 0.9 % 100 mL IVPB  Status:  Discontinued        2 g 200 mL/hr over 30 Minutes Intravenous Every 24 hours 05/28/23 0921 05/28/23 4403  05/28/23 1500  ceFEPIme (MAXIPIME) 1 g in sodium chloride 0.9 % 100 mL IVPB        1 g 200 mL/hr over 30 Minutes Intravenous Every 24 hours 05/28/23 0922 06/02/23 1716   05/28/23 0600  metroNIDAZOLE (FLAGYL) IVPB 500 mg  Status:  Discontinued        500 mg 100 mL/hr over 60 Minutes Intravenous Every 12 hours 05/28/23 0104 05/29/23 0847   05/27/23 1600  vancomycin (VANCOCIN) IVPB 1000 mg/200 mL premix        1,000 mg 200 mL/hr over 60 Minutes Intravenous  Once 05/27/23 1508 05/27/23 1933   05/27/23 1515  ceFEPIme (MAXIPIME) 1 g in sodium chloride 0.9 % 100 mL IVPB        1 g 200 mL/hr over 30 Minutes Intravenous  Once 05/27/23 1508 05/27/23 1558   05/27/23 1515  metroNIDAZOLE (FLAGYL) IVPB 500 mg        500 mg 100 mL/hr over 60 Minutes Intravenous  Once 05/27/23 1508 05/27/23 1933         Assessment/Plan Small bowel ischemia with SBO  POD#44 s/p diagnostic lap converted to ex-lap with extended ileo-cecectomy (mid-ileum to just past cecum), 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 Hillery Hunter Necrotic tissue at midline wound and around ileostomy - appreciate WOC assistance. Plan to change Eakin's Wednesday. Will switch to cleansing with VASHE, santyl to wound bed and VASHE moist gauze to wound bed daily - Monitor ileostomy output, currently only 300cc last 24hr and not currently requring any antimotility agents - palliative following for goals of care discussions   FEN:  Reg diet ID: Rocephin/Flagyl 9/21 > 9/28 VTE: SCDs, SQH q 8h   - per CCM -  Septic shock - no intraabdominal source of infection on imaging, ?aspiration on CT Possible acute neurologic infarct - neuro following  IDDM ESD on HD Bilateral AKA Blind Severe peripheral arterial disease    LOS: 21 days    Franne Forts, [facility] Surgery 06/17/2023, 12:43 PM Please see Amion for pager number during day hours 7:00am-4:30pm

## 2023-06-17 NOTE — Progress Notes (Signed)
   06/17/23 0724  Assess: MEWS Score  Temp (!) 97.2 F (36.2 C)  BP (!) 70/53  MAP (mmHg) (!) 61  Pulse Rate 79  Resp 16  Level of Consciousness Alert  O2 Device Nasal Cannula  O2 Flow Rate (L/min) 2 L/min  Assess: MEWS Score  MEWS Temp 0  MEWS Systolic 2  MEWS Pulse 0  MEWS RR 0  MEWS LOC 0  MEWS Score 2  MEWS Score Color Yellow  Assess: if the MEWS score is Yellow or Red  Were vital signs accurate and taken at a resting state? Yes  Does the patient meet 2 or more of the SIRS criteria? No  Does the patient have a confirmed or suspected source of infection? No  MEWS guidelines implemented  Yes, yellow  Treat  MEWS Interventions Considered administering scheduled or prn medications/treatments as ordered  Take Vital Signs  Increase Vital Sign Frequency  Yellow: Q2hr x1, continue Q4hrs until patient remains green for 12hrs  Escalate  MEWS: Escalate Yellow: Discuss with charge nurse and consider notifying provider and/or RRT  Notify: Charge Nurse/RN  Name of Charge Nurse/RN Notified Jill,RN  Provider Notification  Provider Name/Title Alekh,MD  Date Provider Notified 06/17/23  Time Provider Notified 0730  Method of Notification Page  Notification Reason Other (Comment) (Yellow MEWS)  Provider response Other (Comment) (20mg  midodrine now)  Date of Provider Response 06/17/23  Time of Provider Response 0734  Assess: SIRS CRITERIA  SIRS Temperature  0  SIRS Pulse 0  SIRS Respirations  0  SIRS WBC 0  SIRS Score Sum  0

## 2023-06-17 NOTE — TOC Progression Note (Addendum)
Transition of Care [facility]) - Progression Note    Patient Details  Name: [name] MRN: [id number] Date of Birth: 1981-03-02  Transition of [facility] CM/SW Contact  Carley Hammed, LCSW Phone Number: 06/17/2023, 12:06 PM  Clinical Narrative:    CSW continues to follow pt's progression to assist with DC needs. Current barriers include sitting for HD and ongoing discussions with sister on code status. TOC defers to palliative and appreciates their input with ongoing GOC discussions. TOC will continue to be available.   12:40 CSW responded to consult for funeral assistance, Resources left at bedside. CM consulting chaplain for additional support. CSW also acknowledging the consult that pt's family does not want pt to go back to [facility]. CSW has attempted to address this with family, with no return call. LTC placements are not transitioned in the hospital, this needs to be done from SNF to SNF. TOC will continue to follow.     Barriers to Discharge: Continued Medical Work up  Expected Discharge Plan and Services     Post Acute Care Choice: Skilled Nursing Facility Living arrangements for the past 2 months: Skilled Nursing Facility Expected Discharge Date: 06/12/23                                     Social Determinants of Health (SDOH) Interventions SDOH Screenings   Food Insecurity: No Food Insecurity (06/07/2023)  Housing: Patient Unable To Answer (06/07/2023)  Transportation Needs: No Transportation Needs (06/11/2023)  Utilities: Not At Risk (06/11/2023)  Depression (PHQ2-9): Low Risk  (05/11/2020)  Tobacco Use: Low Risk  (06/07/2023)    Readmission Risk Interventions    07/04/2022   10:49 AM 08/23/2021    3:42 PM  Readmission Risk Prevention Plan  Transportation Screening Complete Complete  HRI or Home Care Consult  Complete  Social Work Consult for Recovery Care Planning/Counseling  Complete  Palliative Care Screening  Not Applicable  Medication Review Special educational needs teacher) Complete Complete  PCP or Specialist appointment within 3-5 days of discharge Complete   HRI or Home Care Consult Complete   SW Recovery Care/Counseling Consult Complete   Palliative Care Screening Not Applicable   Skilled Nursing Facility Complete
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
## 2023-06-18 DIAGNOSIS — A419 Sepsis, unspecified organism: Secondary | ICD-10-CM | POA: Diagnosis not present

## 2023-06-18 DIAGNOSIS — I959 Hypotension, unspecified: Secondary | ICD-10-CM | POA: Diagnosis not present

## 2023-06-18 DIAGNOSIS — Z515 Encounter for palliative care: Secondary | ICD-10-CM | POA: Diagnosis not present

## 2023-06-18 DIAGNOSIS — R6521 Severe sepsis with septic shock: Secondary | ICD-10-CM | POA: Diagnosis not present

## 2023-06-18 DIAGNOSIS — N186 End stage renal disease: Secondary | ICD-10-CM | POA: Diagnosis not present

## 2023-06-18 NOTE — Progress Notes (Addendum)
This chaplain responded to the unit consult for more information on EOL planning. The chaplain received an update from the RN-Jasmin before the visit. Family is not at the bedside. This chaplain will contact the Pt. sister-[name] by phone.  **1050 This chaplain phoned the Pt. Sister-[name]. The chaplain understands [name] will be present at the Pt. bedside in about . The chaplain plans to join [name] at the bedside.  **1115 This chaplain joined the Pt. sister-[name] and Pt. son at the bedside. The chaplain stepped outside the Pt. room to listen reflectively as [name] shared her absence of anticipatory grief, because she is confident she is following the Pt. wishes for EOL care. The chaplain understands [name] finds peace in discerning the Pt. peace in God's call "home" and the freedom from many years of suffering.   This chaplain is available for F/U spiritual care as needed.  Chaplain Stephanie Acre [phone]

## 2023-06-18 NOTE — Progress Notes (Signed)
Patient ID: [name], female   DOB: Jan 21, 1981, 42 y.o.   MRN: [id number] Per family meeting with palliative care service yesterday, patient now on comfort measures only including discontinuation of maintenance hemodialysis.  Nephrology service will not see her physically but will be available for questions or concerns.  Zetta Bills MD [facility]. Office # [phone] Pager # [phone] 10:34 AM

## 2023-06-18 NOTE — Progress Notes (Signed)
PROGRESS NOTE    [name]  ZOX:[id number] DOB: 03/11/1981 DOA: 05/27/2023 PCP: Inc, [facility]   Brief Narrative:  42 year old with history of DM2, ESRD, bilateral AKA, blindness, PAD initially presented with hypotension from dialysis.  Had a recent prolonged stay in the hospital in September 2024 for small bowel ischemia complicated by SBO and had extended ileocecectomy and discharged to SNF.  Patient has had HD catheter in the right chest wall due to difficulty accessing her AV fistula.  Eventually at SNF found to be also hypotensive during her HD therefore admitted to the ICU and PCCM was initially consulted.  Eventually she was weaned off pressors on 10/16 and transferred to [facility].   Eventually her fistula was evaluated which was functioning adequately per IR.  She tolerated multiple HD sessions without any issues.  For reduced EF, cardiology saw the patient and she was deemed to be a poor candidate for any further aggressive workup and recommended outpatient follow-up.   Fistula seems to be working well, currently waiting for her to able to get dialyzed in chair. Unfortunately she has been refusing HD at times. Palliative in touch with the sister, who is considering Hospice/comfort.   She was transitioned to comfort measures on 06/17/2023.  Assessment & Plan:   Comfort measures only status new onset congestive heart failure with reduced EF 25% End-stage renal disease, likely diabetic nephropathy Septic Shock secondary to suspected wound/skin/soft tissue infection, POA Acute metabolic encephalopathy Hypotension Type 2 diabetes with hyperglycemia and hypoglycemia Peripheral vascular disease Bilateral AKA Punctate acute infarct R frontal love, acute to subacute infarct right parieto occipital infarct: Recent small bowel ischemia with small bowel obstruction status post diagnostic lap converted to ex lap with extended ileocecectomy, end ileostomy on  05/04/2023 Leukocytosis Hyperkalemia Acute metabolic acidosis Stage II pressure injury of left buttock Stage I sacral pressure injury  Plan -As discussed above, she was transitioned to comfort measures on 06/17/2023 after palliative care discussed with patient's sister.  Palliative care following.  DVT prophylaxis: None per comfort measures  code Status: DNR Family Communication: None at bedside Disposition Plan: Status is: Inpatient Remains inpatient appropriate because: Of severity of illness  Consultants: Nephrology/neurology/general surgery/palliative care/cardiology/ethics  Procedures: As above  Antimicrobials: None   Subjective: Patient seen and examined at bedside.  Poor historian.  Extremely drowsy.  Looks chronically ill and deconditioned.  No distress. Objective: Vitals:   06/17/23 0348 06/17/23 0633 06/17/23 0724 06/17/23 2125  BP: (!) 69/53 (!) 76/62 (!) 70/53 (!) 47/27  Pulse: 96  79 (!) 110  Resp: 18 18 16 20   Temp: 97.6 F (36.4 C) 97.6 F (36.4 C) (!) 97.2 F (36.2 C) 97.7 F (36.5 C)  TempSrc: Oral Oral  Oral  SpO2: (!) 88% (!) 84%    Weight:      Height:        Intake/Output Summary (Last 24 hours) at 06/18/2023 0806 Last data filed at 06/17/2023 2125 Gross per 24 hour  Intake 60 ml  Output --  Net 60 ml   Filed Weights   06/13/23 0622 06/14/23 0026 06/14/23 0500  Weight: 40.8 kg 40.1 kg 40.4 kg    Examination:  General: No acute distress.  Chronically ill and deconditioned appearing.  Looks very drowsy.  Data Reviewed: I have personally reviewed following labs and imaging studies  CBC: Recent Labs  Lab 06/15/23 1631 06/16/23 1023 06/17/23 0754  WBC 11.8* 14.8* 14.6*  HGB 12.4 12.2 12.0  HCT 40.7 40.1 39.8  MCV 93.8 95.2 95.0  PLT 206 226 222   Basic Metabolic Panel: Recent Labs  Lab 06/12/23 0827 06/15/23 1631 06/16/23 1023 06/17/23 0754  NA 138 139 139 139  K 3.8 4.4 5.2* 5.6*  CL 97* 97* 98 97*  CO2 24 26 18* 19*   GLUCOSE 253* 186* 106* 217*  BUN 27* 34* 47* 56*  CREATININE 5.47* 5.79* 6.53* 7.73*  CALCIUM 10.0 10.1 10.6* 10.5*  MG 2.0  --   --   --   PHOS 5.3* 4.8* 5.8*  --    GFR: Estimated Creatinine Clearance: 6.1 mL/min (A) (by C-G formula based on SCr of 7.73 mg/dL (H)). Liver Function Tests: Recent Labs  Lab 06/12/23 0827 06/15/23 1631 06/16/23 1023 06/17/23 0754  AST  --   --   --  21  ALT  --   --   --  12  ALKPHOS  --   --   --  104  BILITOT  --   --   --  1.0  PROT  --   --   --  7.7  ALBUMIN 2.1* 2.8* 2.7* 2.8*   No results for input(s): "LIPASE", "AMYLASE" in the last 168 hours. No results for input(s): "AMMONIA" in the last 168 hours. Coagulation Profile: No results for input(s): "INR", "PROTIME" in the last 168 hours. Cardiac Enzymes: No results for input(s): "CKTOTAL", "CKMB", "CKMBINDEX", "TROPONINI" in the last 168 hours. BNP (last 3 results) No results for input(s): "PROBNP" in the last 8760 hours. HbA1C: No results for input(s): "HGBA1C" in the last 72 hours. CBG: Recent Labs  Lab 06/16/23 1133 06/16/23 1634 06/16/23 2208 06/17/23 0810 06/17/23 1140  GLUCAP 119* 182* 178* 211* 133*   Lipid Profile: No results for input(s): "CHOL", "HDL", "LDLCALC", "TRIG", "CHOLHDL", "LDLDIRECT" in the last 72 hours. Thyroid Function Tests: No results for input(s): "TSH", "T4TOTAL", "FREET4", "T3FREE", "THYROIDAB" in the last 72 hours. Anemia Panel: No results for input(s): "VITAMINB12", "FOLATE", "FERRITIN", "TIBC", "IRON", "RETICCTPCT" in the last 72 hours. Sepsis Labs: No results for input(s): "PROCALCITON", "LATICACIDVEN" in the last 168 hours.  No results found for this or any previous visit (from the past 240 hour(s)).       Radiology Studies: No results found.      Scheduled Meds:  antiseptic oral rinse  15 mL Topical BID   collagenase   Topical BID   dorzolamide-timolol  1 drop Left Eye BID   dronabinol  5 mg Oral BID AC   lidocaine  1 patch  Transdermal Q24H   Continuous Infusions:          Glade Lloyd, MD [facility] 06/18/2023, 8:06 AM
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
## 2023-06-19 ENCOUNTER — Encounter (HOSPITAL_COMMUNITY): Payer: Self-pay | Admitting: Internal Medicine

## 2023-06-19 DIAGNOSIS — I959 Hypotension, unspecified: Secondary | ICD-10-CM | POA: Diagnosis not present

## 2023-06-19 DIAGNOSIS — R6521 Severe sepsis with septic shock: Secondary | ICD-10-CM | POA: Diagnosis not present

## 2023-06-19 DIAGNOSIS — A419 Sepsis, unspecified organism: Secondary | ICD-10-CM | POA: Diagnosis not present

## 2023-06-19 DIAGNOSIS — Z515 Encounter for palliative care: Secondary | ICD-10-CM | POA: Diagnosis not present

## 2023-06-19 DIAGNOSIS — N186 End stage renal disease: Secondary | ICD-10-CM | POA: Diagnosis not present

## 2023-06-19 NOTE — Consult Note (Signed)
Patient refused Eakin pouch change x 2 today. Will recheck with patient tomorrow. Seal intact.  Cammie Faulstich [facility], CNS, [employer] [phone]

## 2023-06-19 NOTE — Plan of Care (Signed)
  Problem: Pain Managment: Goal: General experience of comfort will improve Outcome: Progressing   Problem: Role Relationship: Goal: Family's ability to cope with current situation will improve Outcome: Progressing

## 2023-06-19 NOTE — Progress Notes (Signed)
Patient  non compliant, O2 dropping, refused to keep nasal cannula on, educated on the needs of supplemental o2, patient continue to refuse.
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
## 2023-06-19 NOTE — Progress Notes (Signed)
PROGRESS NOTE    [name]  FAO:[id number] DOB: [birth year] DOA: 05/27/2023 PCP: [facility]    Brief Narrative:   [name] is a 42 y.o. female with past medical history significant for DM2, ESRD on HD, PVD/PAD s/p bilateral AKA, blindness who presented to [facility] ED on 10/14 from outpatient hemodialysis with hypotension.  Patient with recent prolonged hospitalization September 2024 for small bowel ischemia complicated by SBO and underwent extended ileocecectomy; and subsequently discharged to SNF.  Patient has had HD catheter in the right chest wall due to difficulty accessing her AV fistula.  Initially admitted to the ICU under PCCM service. Eventually she was weaned off pressors on 10/16 and transferred to [facility] on 10/18.   Her fistula was evaluated which was functioning adequately per IR.  She tolerated multiple HD sessions without any issues.  For reduced EF, cardiology evaluated the patient and she was deemed to be a poor candidate for any further aggressive workup and recommended outpatient follow-up.  Given patient's advanced comorbidities and general decline; palliative care was consulted with family discussions with sister with agreement to transition to hospice/comfort.  Patient was transferred to comfort measures on 06/17/2023.   Fistula seems to be working well, currently waiting for her to able to get dialyzed in chair. Unfortunately she has been refusing HD at times. Palliative in touch with the sister, who is considering Hospice/comfort.    She was transitioned to comfort measures on 06/17/2023.  Assessment & Plan:   Septic shock secondary to suspected wound/skin/soft tissue infection, POA Acute metabolic encephalopathy New onset congestive heart failure with reduced LVEF (25%) Acute punctate right frontal lobe infarct Subacute right parietal/occipital infarct Recent small bowel ischemia with small bowel obstruction s/p extended ileocecostomy, end  ileostomy Hyperkalemia Acute metabolic acidosis PVD/PAD s/p bilateral AKA Stage I sacral pressure injury Stage II pressure injury left buttock Patient presenting from outpatient hemodialysis with hypotension.  Recent small bowel ischemia s/p extended ileocecostomy with end ileostomy.  Has been nonadherent to dressing changes for her open abdominal wound and did not agree to wound VAC on discharge.  Patient was initially admitted to the intensive care unit, started on empiric antibiotics and vasopressors.  Patient was slowly weaned from vasopressors and transferred out of the intensive care unit.  Nephrology was also consulted and continued to follow during hospital course for hemodialysis.  Neurology was also consulted with no further recommendations.  With patient's new onset cardiomyopathy, cardiology with no recommendations given her advanced, abilities and general decline.  Palliative care was consulted and given her poor overall functional status patient was transition to comfort measures on 06/17/2023. -- Glycopyrrolate as needed excessive secretions -- Haldol as needed agitation/delirium -- Dilaudid as needed severe pain, respiratory distress -- DuoNebs as needed shortness of breath/wheezing -- Lorazepam as needed anxiety/seizure, distress -- Anticipate in-hospital death   DVT prophylaxis: Comfort measures    Code Status: Do not attempt resuscitation (DNR) - Comfort care Family Communication: No family members present at bedside  Disposition Plan:  Level of care: Med-Surg Status is: Inpatient Remains inpatient appropriate because: Anticipate in-hospital death    Consultants:  PCCM General Surgery Interventional radiology Nephrology Neurology Palliative care Ethics  Procedures:  Arterial line placement EEG  Antimicrobials:  Vancomycin 10/16, 10/21 Metronidazole 10/14 10-15 Cefepime 10/14 - 11/20   Subjective: Patient seen examined at bedside, resting calmly.  Lying  in bed.  Sleeping.  When asked about pain, states "yes".  No family present at bedside.  No other specific questions or concerns at this time.  Unable to obtain any further ROS given her current mental status.  Anticipate in-hospital death.  No acute concerns overnight per nursing staff.  Objective: Vitals:   06/17/23 2125 06/18/23 0902 06/18/23 2034 06/18/23 2052  BP: (!) 47/27 (!) 116/38 (!) 45/26 105/72  Pulse: (!) 110 69 84 82  Resp: 20 18    Temp: 97.7 F (36.5 C) (!) 97.3 F (36.3 C) 97.7 F (36.5 C) 97.7 F (36.5 C)  TempSrc: Oral  Oral Oral  SpO2:  (!) 75% (!) 72%   Weight:      Height:        Intake/Output Summary (Last 24 hours) at 06/19/2023 1536 Last data filed at 06/19/2023 1138 Gross per 24 hour  Intake 180 ml  Output --  Net 180 ml   Filed Weights   06/13/23 0622 06/14/23 0026 06/14/23 0500  Weight: 40.8 kg 40.1 kg 40.4 kg    Examination:  Physical Exam: GEN: NAD, chronically ill in appearance HEENT: NCAT PULM: CTAB w/o wheezes/crackles, normal respiratory effort, on room air CV: RRR w/o M/G/R GI: abd soft, + BS MSK: no peripheral edema    Data Reviewed: I have personally reviewed following labs and imaging studies  CBC: Recent Labs  Lab 06/15/23 1631 06/16/23 1023 06/17/23 0754  WBC 11.8* 14.8* 14.6*  HGB 12.4 12.2 12.0  HCT 40.7 40.1 39.8  MCV 93.8 95.2 95.0  PLT 206 226 222   Basic Metabolic Panel: Recent Labs  Lab 06/15/23 1631 06/16/23 1023 06/17/23 0754  NA 139 139 139  K 4.4 5.2* 5.6*  CL 97* 98 97*  CO2 26 18* 19*  GLUCOSE 186* 106* 217*  BUN 34* 47* 56*  CREATININE 5.79* 6.53* 7.73*  CALCIUM 10.1 10.6* 10.5*  PHOS 4.8* 5.8*  --    GFR: Estimated Creatinine Clearance: 6.1 mL/min (A) (by C-G formula based on SCr of 7.73 mg/dL (H)). Liver Function Tests: Recent Labs  Lab 06/15/23 1631 06/16/23 1023 06/17/23 0754  AST  --   --  21  ALT  --   --  12  ALKPHOS  --   --  104  BILITOT  --   --  1.0  PROT  --   --  7.7   ALBUMIN 2.8* 2.7* 2.8*   No results for input(s): "LIPASE", "AMYLASE" in the last 168 hours. No results for input(s): "AMMONIA" in the last 168 hours. Coagulation Profile: No results for input(s): "INR", "PROTIME" in the last 168 hours. Cardiac Enzymes: No results for input(s): "CKTOTAL", "CKMB", "CKMBINDEX", "TROPONINI" in the last 168 hours. BNP (last 3 results) No results for input(s): "PROBNP" in the last 8760 hours. HbA1C: No results for input(s): "HGBA1C" in the last 72 hours. CBG: Recent Labs  Lab 06/16/23 1133 06/16/23 1634 06/16/23 2208 06/17/23 0810 06/17/23 1140  GLUCAP 119* 182* 178* 211* 133*   Lipid Profile: No results for input(s): "CHOL", "HDL", "LDLCALC", "TRIG", "CHOLHDL", "LDLDIRECT" in the last 72 hours. Thyroid Function Tests: No results for input(s): "TSH", "T4TOTAL", "FREET4", "T3FREE", "THYROIDAB" in the last 72 hours. Anemia Panel: No results for input(s): "VITAMINB12", "FOLATE", "FERRITIN", "TIBC", "IRON", "RETICCTPCT" in the last 72 hours. Sepsis Labs: No results for input(s): "PROCALCITON", "LATICACIDVEN" in the last 168 hours.  No results found for this or any previous visit (from the past 240 hour(s)).       Radiology Studies: No results found.      Scheduled Meds:  antiseptic oral rinse  15 mL Topical BID   collagenase   Topical BID   dorzolamide-timolol  1 drop Left Eye BID   lidocaine  1 patch Transdermal Q24H   Continuous Infusions:   LOS: 23 days    Time spent: 43 minutes spent on chart review, discussion with nursing staff, consultants, updating family and interview/physical exam; more than 50% of that time was spent in counseling and/or coordination of care.    Alvira Philips [place], DO [facility] Available via Epic secure chat 7am-7pm After these hours, please refer to coverage provider listed on [email or web address] 06/19/2023, 3:36 PM

## 2023-06-20 DIAGNOSIS — R6521 Severe sepsis with septic shock: Secondary | ICD-10-CM | POA: Diagnosis not present

## 2023-06-20 DIAGNOSIS — A419 Sepsis, unspecified organism: Secondary | ICD-10-CM | POA: Diagnosis not present

## 2023-06-20 NOTE — Progress Notes (Signed)
PROGRESS NOTE    [name]  ZOX:[id number] DOB: 1981-08-04 DOA: 05/27/2023 PCP: [facility], [facility]    Brief Narrative:   [name] is a 42 y.o. female with past medical history significant for DM2, ESRD on HD, PVD/PAD s/p bilateral AKA, blindness who presented to [facility] ED on 10/14 from outpatient hemodialysis with hypotension.  Patient with recent prolonged hospitalization September 2024 for small bowel ischemia complicated by SBO and underwent extended ileocecectomy; and subsequently discharged to SNF.  Patient has had HD catheter in the right chest wall due to difficulty accessing her AV fistula.  Initially admitted to the ICU under PCCM service. Eventually she was weaned off pressors on 10/16 and transferred to [facility] on 10/18.   Her fistula was evaluated which was functioning adequately per IR.  She tolerated multiple HD sessions without any issues.  For reduced EF, cardiology evaluated the patient and she was deemed to be a poor candidate for any further aggressive workup and recommended outpatient follow-up.  Given patient's advanced comorbidities and general decline; palliative care was consulted with family discussions with sister with agreement to transition to hospice/comfort.  Patient was transferred to comfort measures on 06/17/2023.   Fistula seems to be working well, currently waiting for her to able to get dialyzed in chair. Unfortunately she has been refusing HD at times. Palliative in touch with the sister, who is considering Hospice/comfort.    She was transitioned to comfort measures on 06/17/2023.  Assessment & Plan:   Septic shock secondary to suspected wound/skin/soft tissue infection, POA Acute metabolic encephalopathy New onset congestive heart failure with reduced LVEF (25%) Acute punctate right frontal lobe infarct Subacute right parietal/occipital infarct Recent small bowel ischemia with small bowel obstruction s/p extended ileocecostomy, end  ileostomy Hyperkalemia Acute metabolic acidosis PVD/PAD s/p bilateral AKA Stage I sacral pressure injury Stage II pressure injury left buttock Patient presenting from outpatient hemodialysis with hypotension.  Recent small bowel ischemia s/p extended ileocecostomy with end ileostomy.  Has been nonadherent to dressing changes for her open abdominal wound and did not agree to wound VAC on discharge.  Patient was initially admitted to the intensive care unit, started on empiric antibiotics and vasopressors.  Patient was slowly weaned from vasopressors and transferred out of the intensive care unit.  Nephrology was also consulted and continued to follow during hospital course for hemodialysis.  Neurology was also consulted with no further recommendations.  With patient's new onset cardiomyopathy, cardiology with no recommendations given her advanced, abilities and general decline.  Palliative care was consulted and given her poor overall functional status patient was transition to comfort measures on 06/17/2023. -- Glycopyrrolate as needed excessive secretions -- Haldol as needed agitation/delirium -- Dilaudid as needed severe pain, respiratory distress -- DuoNebs as needed shortness of breath/wheezing -- Lorazepam as needed anxiety/seizure, distress -- Anticipate in-hospital death   DVT prophylaxis: Comfort measures    Code Status: Do not attempt resuscitation (DNR) - Comfort care Family Communication: No family members present at bedside  Disposition Plan:  Level of care: Med-Surg Status is: Inpatient Remains inpatient appropriate because: Anticipate in-hospital death    Consultants:  PCCM General Surgery Interventional radiology Nephrology Neurology Palliative care Ethics  Procedures:  Arterial line placement EEG  Antimicrobials:  Vancomycin 10/16, 10/21 Metronidazole 10/14 10-15 Cefepime 10/14 - 11/20   Subjective: Patient seen examined at bedside, resting calmly.  Lying  in bed.  Sleeping.  Does not arouse to verbal command. Anticipate in-hospital death.  No acute concerns  overnight per nursing staff.  Objective: Vitals:   06/18/23 2034 06/18/23 2052 06/20/23 0434 06/20/23 0945  BP: (!) 45/26 105/72 (!) 74/61 (!) 147/43  Pulse: 84 82 92   Resp:   18 19  Temp: 97.7 F (36.5 C) 97.7 F (36.5 C) 97.6 F (36.4 C) 98.4 F (36.9 C)  TempSrc: Oral Oral Oral Oral  SpO2: (!) 72%   (!) 74%  Weight:      Height:        Intake/Output Summary (Last 24 hours) at 06/20/2023 1421 Last data filed at 06/20/2023 0900 Gross per 24 hour  Intake 0 ml  Output 275 ml  Net -275 ml   Filed Weights   06/13/23 0622 06/14/23 0026 06/14/23 0500  Weight: 40.8 kg 40.1 kg 40.4 kg    Examination:  Physical Exam: GEN: NAD, chronically ill in appearance HEENT: NCAT PULM: CTAB w/o wheezes/crackles, normal respiratory effort, on room air CV: RRR w/o M/G/R GI: abd soft, + BS MSK: no peripheral edema    Data Reviewed: I have personally reviewed following labs and imaging studies  CBC: Recent Labs  Lab 06/15/23 1631 06/16/23 1023 06/17/23 0754  WBC 11.8* 14.8* 14.6*  HGB 12.4 12.2 12.0  HCT 40.7 40.1 39.8  MCV 93.8 95.2 95.0  PLT 206 226 222   Basic Metabolic Panel: Recent Labs  Lab 06/15/23 1631 06/16/23 1023 06/17/23 0754  NA 139 139 139  K 4.4 5.2* 5.6*  CL 97* 98 97*  CO2 26 18* 19*  GLUCOSE 186* 106* 217*  BUN 34* 47* 56*  CREATININE 5.79* 6.53* 7.73*  CALCIUM 10.1 10.6* 10.5*  PHOS 4.8* 5.8*  --    GFR: Estimated Creatinine Clearance: 6.1 mL/min (A) (by C-G formula based on SCr of 7.73 mg/dL (H)). Liver Function Tests: Recent Labs  Lab 06/15/23 1631 06/16/23 1023 06/17/23 0754  AST  --   --  21  ALT  --   --  12  ALKPHOS  --   --  104  BILITOT  --   --  1.0  PROT  --   --  7.7  ALBUMIN 2.8* 2.7* 2.8*   No results for input(s): "LIPASE", "AMYLASE" in the last 168 hours. No results for input(s): "AMMONIA" in the last 168  hours. Coagulation Profile: No results for input(s): "INR", "PROTIME" in the last 168 hours. Cardiac Enzymes: No results for input(s): "CKTOTAL", "CKMB", "CKMBINDEX", "TROPONINI" in the last 168 hours. BNP (last 3 results) No results for input(s): "PROBNP" in the last 8760 hours. HbA1C: No results for input(s): "HGBA1C" in the last 72 hours. CBG: Recent Labs  Lab 06/16/23 1133 06/16/23 1634 06/16/23 2208 06/17/23 0810 06/17/23 1140  GLUCAP 119* 182* 178* 211* 133*   Lipid Profile: No results for input(s): "CHOL", "HDL", "LDLCALC", "TRIG", "CHOLHDL", "LDLDIRECT" in the last 72 hours. Thyroid Function Tests: No results for input(s): "TSH", "T4TOTAL", "FREET4", "T3FREE", "THYROIDAB" in the last 72 hours. Anemia Panel: No results for input(s): "VITAMINB12", "FOLATE", "FERRITIN", "TIBC", "IRON", "RETICCTPCT" in the last 72 hours. Sepsis Labs: No results for input(s): "PROCALCITON", "LATICACIDVEN" in the last 168 hours.  No results found for this or any previous visit (from the past 240 hour(s)).       Radiology Studies: No results found.      Scheduled Meds:  antiseptic oral rinse  15 mL Topical BID   collagenase   Topical BID   dorzolamide-timolol  1 drop Left Eye BID   Continuous Infusions:   LOS: 24 days  Time spent: 43 minutes spent on chart review, discussion with nursing staff, consultants, updating family and interview/physical exam; more than 50% of that time was spent in counseling and/or coordination of care.    Alvira Philips [place], DO [facility] Available via Epic secure chat 7am-7pm After these hours, please refer to coverage provider listed on [email or web address] 06/20/2023, 2:21 PM
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
## 2023-06-20 NOTE — Plan of Care (Signed)
  Problem: Education: Goal: Knowledge of the prescribed therapeutic regimen will improve Outcome: Not Progressing   Problem: Coping: Goal: Ability to identify and develop effective coping behavior will improve Outcome: Not Progressing   Problem: Clinical Measurements: Goal: Quality of life will improve Outcome: Not Progressing

## 2023-06-21 DIAGNOSIS — R6521 Severe sepsis with septic shock: Secondary | ICD-10-CM | POA: Diagnosis not present

## 2023-06-21 DIAGNOSIS — A419 Sepsis, unspecified organism: Secondary | ICD-10-CM | POA: Diagnosis not present

## 2023-07-14 NOTE — Progress Notes (Signed)
Called the sister Babette Relic, was able to left voicemail to call [facility] at [phone].Message repeated twice.

## 2023-07-14 NOTE — Progress Notes (Signed)
PROGRESS NOTE    [name]  QMV:[id number] DOB: [birth year] DOA: 05/24/2023 PCP: [facility]    Brief Narrative:   [name] is a 42 y.o. female with past medical history significant for DM2, ESRD on HD, PVD/PAD s/p bilateral AKA, blindness who presented to [facility] ED on 10/14 from outpatient hemodialysis with hypotension.  Patient with recent prolonged hospitalization September 2024 for small bowel ischemia complicated by SBO and underwent extended ileocecectomy; and subsequently discharged to SNF.  Patient has had HD catheter in the right chest wall due to difficulty accessing her AV fistula.  Initially admitted to the ICU under PCCM service. Eventually she was weaned off pressors on 10/16 and transferred to [facility] on 10/18.   Her fistula was evaluated which was functioning adequately per IR.  She tolerated multiple HD sessions without any issues.  For reduced EF, cardiology evaluated the patient and she was deemed to be a poor candidate for any further aggressive workup and recommended outpatient follow-up.  Given patient's advanced comorbidities and general decline; palliative care was consulted with family discussions with sister with agreement to transition to hospice/comfort.  Patient was transferred to comfort measures on 06/17/2023.   Fistula seems to be working well, currently waiting for her to able to get dialyzed in chair. Unfortunately she has been refusing HD at times. Palliative in touch with the sister, who is considering Hospice/comfort.    She was transitioned to comfort measures on 06/17/2023.  Assessment & Plan:   Septic shock secondary to suspected wound/skin/soft tissue infection, POA Acute metabolic encephalopathy New onset congestive heart failure with reduced LVEF (25%) Acute punctate right frontal lobe infarct Subacute right parietal/occipital infarct Recent small bowel ischemia with small bowel obstruction s/p extended ileocecostomy, end  ileostomy Hyperkalemia Acute metabolic acidosis PVD/PAD s/p bilateral AKA Stage I sacral pressure injury Stage II pressure injury left buttock Patient presenting from outpatient hemodialysis with hypotension.  Recent small bowel ischemia s/p extended ileocecostomy with end ileostomy.  Has been nonadherent to dressing changes for her open abdominal wound and did not agree to wound VAC on discharge.  Patient was initially admitted to the intensive care unit, started on empiric antibiotics and vasopressors.  Patient was slowly weaned from vasopressors and transferred out of the intensive care unit.  Nephrology was also consulted and continued to follow during hospital course for hemodialysis.  Neurology was also consulted with no further recommendations.  With patient's new onset cardiomyopathy, cardiology with no recommendations given her advanced, abilities and general decline.  Palliative care was consulted and given her poor overall functional status patient was transition to comfort measures on 06/17/2023. -- Glycopyrrolate as needed excessive secretions -- Haldol as needed agitation/delirium -- Dilaudid as needed severe pain, respiratory distress -- DuoNebs as needed shortness of breath/wheezing -- Lorazepam as needed anxiety/seizure, distress -- Anticipate in-hospital death   DVT prophylaxis: Comfort measures    Code Status: Do not attempt resuscitation (DNR) - Comfort care Family Communication: No family members present at bedside  Disposition Plan:  Level of care: Med-Surg Status is: Inpatient Remains inpatient appropriate because: Anticipate in-hospital death    Consultants:  PCCM General Surgery Interventional radiology Nephrology Neurology Palliative care Ethics  Procedures:  Arterial line placement EEG  Antimicrobials:  Vancomycin 10/16, 10/21 Metronidazole 10/14 10-15 Cefepime 10/14 - 11/20   Subjective: Patient seen examined at bedside, resting calmly.  Lying  in bed.  Sleeping.  Does not arouse to verbal command.  Shallow breathing.  Anticipate in-hospital death.  No acute concerns overnight per nursing staff.  Objective: Vitals:   06/20/23 0434 06/20/23 0945 06/17/2023 0314 06/20/2023 0424  BP: (!) 74/61 (!) 147/43 (!) 51/34 (!) 47/28  Pulse: 92   97  Resp: 18 19 13 16   Temp: 97.6 F (36.4 C) 98.4 F (36.9 C) 98.4 F (36.9 C) 98.4 F (36.9 C)  TempSrc: Oral Oral Oral Oral  SpO2:  (!) 74%  (!) 71%  Weight:      Height:        Intake/Output Summary (Last 24 hours) at 07/11/2023 0840 Last data filed at 06/20/2023 2200 Gross per 24 hour  Intake 0 ml  Output --  Net 0 ml   Filed Weights   06/13/23 0622 06/14/23 0026 06/14/23 0500  Weight: 40.8 kg 40.1 kg 40.4 kg    Examination:  Physical Exam: GEN: NAD, chronically ill in appearance, shallow breathing HEENT: NCAT PULM: CTAB w/o wheezes/crackles, normal respiratory effort, on room air CV: RRR w/o M/G/R GI: abd soft, + BS MSK: no peripheral edema    Data Reviewed: I have personally reviewed following labs and imaging studies  CBC: Recent Labs  Lab 06/15/23 1631 06/16/23 1023 06/17/23 0754  WBC 11.8* 14.8* 14.6*  HGB 12.4 12.2 12.0  HCT 40.7 40.1 39.8  MCV 93.8 95.2 95.0  PLT 206 226 222   Basic Metabolic Panel: Recent Labs  Lab 06/15/23 1631 06/16/23 1023 06/17/23 0754  NA 139 139 139  K 4.4 5.2* 5.6*  CL 97* 98 97*  CO2 26 18* 19*  GLUCOSE 186* 106* 217*  BUN 34* 47* 56*  CREATININE 5.79* 6.53* 7.73*  CALCIUM 10.1 10.6* 10.5*  PHOS 4.8* 5.8*  --    GFR: Estimated Creatinine Clearance: 6.1 mL/min (A) (by C-G formula based on SCr of 7.73 mg/dL (H)). Liver Function Tests: Recent Labs  Lab 06/15/23 1631 06/16/23 1023 06/17/23 0754  AST  --   --  21  ALT  --   --  12  ALKPHOS  --   --  104  BILITOT  --   --  1.0  PROT  --   --  7.7  ALBUMIN 2.8* 2.7* 2.8*   No results for input(s): "LIPASE", "AMYLASE" in the last 168 hours. No results for input(s):  "AMMONIA" in the last 168 hours. Coagulation Profile: No results for input(s): "INR", "PROTIME" in the last 168 hours. Cardiac Enzymes: No results for input(s): "CKTOTAL", "CKMB", "CKMBINDEX", "TROPONINI" in the last 168 hours. BNP (last 3 results) No results for input(s): "PROBNP" in the last 8760 hours. HbA1C: No results for input(s): "HGBA1C" in the last 72 hours. CBG: Recent Labs  Lab 06/16/23 1133 06/16/23 1634 06/16/23 2208 06/17/23 0810 06/17/23 1140  GLUCAP 119* 182* 178* 211* 133*   Lipid Profile: No results for input(s): "CHOL", "HDL", "LDLCALC", "TRIG", "CHOLHDL", "LDLDIRECT" in the last 72 hours. Thyroid Function Tests: No results for input(s): "TSH", "T4TOTAL", "FREET4", "T3FREE", "THYROIDAB" in the last 72 hours. Anemia Panel: No results for input(s): "VITAMINB12", "FOLATE", "FERRITIN", "TIBC", "IRON", "RETICCTPCT" in the last 72 hours. Sepsis Labs: No results for input(s): "PROCALCITON", "LATICACIDVEN" in the last 168 hours.  No results found for this or any previous visit (from the past 240 hour(s)).       Radiology Studies: No results found.      Scheduled Meds:  antiseptic oral rinse  15 mL Topical BID   dorzolamide-timolol  1 drop Left Eye BID   Continuous Infusions:   LOS: 25 days  Time spent: 43 minutes spent on chart review, discussion with nursing staff, consultants, updating family and interview/physical exam; more than 50% of that time was spent in counseling and/or coordination of care.    Alvira Philips [place], DO [facility] Available via Epic secure chat 7am-7pm After these hours, please refer to coverage provider listed on [email or web address] 06/15/2023, 8:40 AM

## 2023-07-14 NOTE — Progress Notes (Signed)
Sister called and said she will be coming in to see the body.

## 2023-07-14 NOTE — Consult Note (Signed)
WOC Nurse Consult Note: Pt now with comfort care goals. She is not responsive and currently has shallow breathing.  Progress notes indicate medical team is anticipating a hospital death.  Abd dressing is dry and intact. Small [place] pouch is intact with good seal with small amt liquid brown stool in bedside drainage bag.  Extra set of supplies at the bedside and instructions have been provided for bedside nurses to change PRN if leaking.  Please re-consult if further assistance is needed.  Thank-you,  Cammie Mcgee MSN, RN, CWOCN, [place], CNS [phone]

## 2023-07-14 NOTE — Progress Notes (Signed)
Called by the staff that patient stop breathing while staff giving care to the patient. Upon assessment, no breathing and pulse appreciated/palpable. Another RN verified the death. Attending Md made ware of the death via secure chat. [employer] called, spoke with lauren Peebles with referral number [id number] ,body is suitable for tissue and eye donation.

## 2023-07-14 NOTE — Death Summary Note (Signed)
DEATH SUMMARY   Patient Details  Name: [name] MRN: [id number] DOB: 01-11-1981 PCP:[facility] Admission/Discharge Information   Admit Date:  06/23/2023  Date of Death: Date of Death: 2023-07-18  Time of Death: Time of Death: 0912  Length of Stay: 08-04-2023   Principle Cause of death: Septic shock secondary to soft tissue/wound infection  Hospital Diagnoses: Principal Problem:   Septic shock (HCC) Active Problems:   Comfort measures only status  Brief Narrative:  Yamika Cifarelli is a 42 y.o. female with past medical history significant for DM2, ESRD on HD, PVD/PAD s/p bilateral AKA, blindness who presented to [facility] ED on 06-23-2023 from outpatient hemodialysis with hypotension.  Patient with recent prolonged hospitalization September 2024 for small bowel ischemia complicated by SBO and underwent extended ileocecectomy; and subsequently discharged to SNF.  Patient has had HD catheter in the right chest wall due to difficulty accessing her AV fistula.  Initially admitted to the ICU under PCCM service. Eventually she was weaned off pressors on 10/16 and transferred to [facility] on 10/18.   Her fistula was evaluated which was functioning adequately per IR.  She tolerated multiple HD sessions without any issues.  For reduced EF, cardiology evaluated the patient and she was deemed to be a poor candidate for any further aggressive workup and recommended outpatient follow-up.  Given patient's advanced comorbidities and general decline; palliative care was consulted with family discussions with sister with agreement to transition to hospice/comfort.  Patient was transferred to comfort measures on 2023/07/14.   Fistula seems to be working well, currently waiting for her to able to get dialyzed in chair. Unfortunately she has been refusing HD at times. Palliative in touch with the sister, who is considering Hospice/comfort.    She was transitioned to comfort measures on  07/14/23.   Hospital Course:  Septic shock secondary to suspected wound/skin/soft tissue infection, POA Acute metabolic encephalopathy New onset congestive heart failure with reduced LVEF (25%) Acute punctate right frontal lobe infarct Subacute right parietal/occipital infarct Recent small bowel ischemia with small bowel obstruction s/p extended ileocecostomy, end ileostomy Hyperkalemia Acute metabolic acidosis PVD/PAD s/p bilateral AKA Stage I sacral pressure injury Stage II pressure injury left buttock ESRD intolerant to hemodialysis 2/2 hypotension Patient presenting from outpatient hemodialysis with hypotension.  Recent small bowel ischemia s/p extended ileocecostomy with end ileostomy.  Has been nonadherent to dressing changes for her open abdominal wound and did not agree to wound VAC on discharge.  Patient was initially admitted to the intensive care unit, started on empiric antibiotics and vasopressors.  Patient was slowly weaned from vasopressors and transferred out of the intensive care unit.  Nephrology was also consulted and continued to follow during hospital course for hemodialysis.  Neurology was also consulted with no further recommendations.  With patient's new onset cardiomyopathy, cardiology with no recommendations given her advanced, abilities and general decline.  Additionally patient was not able to tolerate further hemodialysis given her low blood pressures.  Palliative care was consulted and given her poor overall functional status patient was transition to comfort measures on 07/14/23.  Patient passed on July 18, 2023 at 9:12 AM.   Procedures:  Arterial line placement EEG  Consultations:  PCCM General Surgery Interventional radiology Nephrology Neurology Palliative care Ethics  The results of significant diagnostics from this hospitalization (including imaging, microbiology, ancillary and laboratory) are listed below for reference.   Significant Diagnostic  Studies: IR US Guide Vasc Access Right  Result Date: 06/05/2023 INDICATION: 42 year old with end-stage  renal disease and poor flows during dialysis. Patient has a right upper extremity brachiocephalic fistula. Right cephalic vein and right central veins were recently angioplastied on 05/14/2023. EXAM: 1. Right upper extremity fistulogram 2. Balloon angioplasty of right cephalic vein and right central veins 3. Ultrasound guidance for vascular access MEDICATIONS: Moderate sedation ANESTHESIA/SEDATION: Moderate (conscious) sedation was employed during this procedure. A total of Versed 0.5 mg and Fentanyl 25 mcg was administered intravenously by the radiology nurse. Total intra-service moderate Sedation Time: 47 minutes. The patient's level of consciousness and vital signs were monitored continuously by radiology nursing throughout the procedure under my direct supervision. FLUOROSCOPY: Radiation Exposure Index (as provided by the fluoroscopic device): 27 mGy Kerma CONTRAST:  100 mL Omnipaque 300 COMPLICATIONS: None immediate. PROCEDURE: Informed consent was obtained. A timeout was performed prior to the initiation of the procedure. Patient was placed supine on the interventional table. The right upper arm was prepped and draped in sterile fashion. Maximal barrier sterile technique was utilized including caps, mask, sterile gowns, sterile gloves, sterile drape, hand hygiene and skin antiseptic. Ultrasound confirmed a patent right brachiocephalic fistula and ultrasound image was saved for documentation. Skin was anesthetized near the right elbow with 1% lidocaine. Using ultrasound guidance, 21 gauge needle was directed into the cephalic vein and micropuncture dilator set was placed. Right upper extremity fistulogram images were obtained. 7 [place] vascular sheath was placed over a Bentson wire. Kumpe catheter was used to advance the wire into the SVC. The right innominate vein, right subclavian vein and right cephalic  arch were angioplastied using a 7 mm x 40 mm Mustang balloon. Follow-up fistulogram images were obtained. Right innominate vein and right subclavian vein were then angioplastied with a 10 mm x 40 mm mustang balloon. Follow-up fistulogram images were obtained. Right innominate vein and right subclavian vein were angioplastied with a 12 mm x 40 mm Mustang balloon and follow-up fistulogram images were obtained. Vascular sheath was removed with a pursestring suture. Bandage was placed. FINDINGS: Right brachiocephalic fistula is patent with an irregular saccular aneurysm involving the mid humeral cephalic vein. These findings are similar to the recent comparison examination. Greater than 50% stenosis involving the cephalic arch. Again noted is narrowing and irregularity involving the right subclavian vein and right innominate vein. Narrowing in the right innominate vein appears to be close to 50%. Again noted are collateral vessels in the right axillary region. SVC is patent. Cephalic vein in the lower humeral region is heavily calcified and the arterial anastomosis is patent. Following balloon angioplasty of the cephalic arch and the right central veins, there is mildly improved flow in both areas. There continues to be mild irregularity and narrowing throughout these areas and persistent filling of the collateral vessels. IMPRESSION: 1. Right upper extremity brachiocephalic fistula is patent. Residual and recurrent areas of narrowing involving the cephalic vein arch and the right central veins. These areas were treated with balloon angioplasty. Cephalic vein was angioplastied up to 7 mm and the right central veins were angioplastied up to 12 mm. Mildly improved flow in both areas following balloon angioplasty. 2. 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 Findings are similar to the previous examination. It appears this aneurysm is getting accessed during dialysis and likely  contributes to the poor flow. Recommend avoiding this area for dialysis access. Consider surgical consultation of this aneurysm. ACCESS: This access remains amenable to future percutaneous interventions as clinically indicated. Electronically Signed   By: Meriel Pica.D.  On: 06/05/2023 15:07   IR AV DIALY SHUNT INTRO NEEDLE/INTRACATH INITIAL W/PTA/IMG RIGHT  Result Date: 06/05/2023 INDICATION: 42 year old with end-stage renal disease and poor flows during dialysis. Patient has a right upper extremity brachiocephalic fistula. Right cephalic vein and right central veins were recently angioplastied on 05/14/2023. EXAM: 1. Right upper extremity fistulogram 2. Balloon angioplasty of right cephalic vein and right central veins 3. Ultrasound guidance for vascular access MEDICATIONS: Moderate sedation ANESTHESIA/SEDATION: Moderate (conscious) sedation was employed during this procedure. A total of Versed 0.5 mg and Fentanyl 25 mcg was administered intravenously by the radiology nurse. Total intra-service moderate Sedation Time: 47 minutes. The patient's level of consciousness and vital signs were monitored continuously by radiology nursing throughout the procedure under my direct supervision. FLUOROSCOPY: Radiation Exposure Index (as provided by the fluoroscopic device): 27 mGy Kerma CONTRAST:  100 mL Omnipaque 300 COMPLICATIONS: None immediate. PROCEDURE: Informed consent was obtained. A timeout was performed prior to the initiation of the procedure. Patient was placed supine on the interventional table. The right upper arm was prepped and draped in sterile fashion. Maximal barrier sterile technique was utilized including caps, mask, sterile gowns, sterile gloves, sterile drape, hand hygiene and skin antiseptic. Ultrasound confirmed a patent right brachiocephalic fistula and ultrasound image was saved for documentation. Skin was anesthetized near the right elbow with 1% lidocaine. Using ultrasound guidance, 21 gauge  needle was directed into the cephalic vein and micropuncture dilator set was placed. Right upper extremity fistulogram images were obtained. 7 [place] vascular sheath was placed over a Bentson wire. Kumpe catheter was used to advance the wire into the SVC. The right innominate vein, right subclavian vein and right cephalic arch were angioplastied using a 7 mm x 40 mm Mustang balloon. Follow-up fistulogram images were obtained. Right innominate vein and right subclavian vein were then angioplastied with a 10 mm x 40 mm mustang balloon. Follow-up fistulogram images were obtained. Right innominate vein and right subclavian vein were angioplastied with a 12 mm x 40 mm Mustang balloon and follow-up fistulogram images were obtained. Vascular sheath was removed with a pursestring suture. Bandage was placed. FINDINGS: Right brachiocephalic fistula is patent with an irregular saccular aneurysm involving the mid humeral cephalic vein. These findings are similar to the recent comparison examination. Greater than 50% stenosis involving the cephalic arch. Again noted is narrowing and irregularity involving the right subclavian vein and right innominate vein. Narrowing in the right innominate vein appears to be close to 50%. Again noted are collateral vessels in the right axillary region. SVC is patent. Cephalic vein in the lower humeral region is heavily calcified and the arterial anastomosis is patent. Following balloon angioplasty of the cephalic arch and the right central veins, there is mildly improved flow in both areas. There continues to be mild irregularity and narrowing throughout these areas and persistent filling of the collateral vessels. IMPRESSION: 1. Right upper extremity brachiocephalic fistula is patent. Residual and recurrent areas of narrowing involving the cephalic vein arch and the right central veins. These areas were treated with balloon angioplasty. Cephalic vein was angioplastied up to 7 mm and the right  central veins were angioplastied up to 12 mm. Mildly improved flow in both areas following balloon angioplasty. 2. 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 Findings are similar to the previous examination. It appears this aneurysm is getting accessed during dialysis and likely contributes to the poor flow. Recommend avoiding this area for dialysis access. Consider surgical consultation of this  aneurysm. ACCESS: This access remains amenable to future percutaneous interventions as clinically indicated. Electronically Signed   By: Richarda Overlie M.D.   On: 06/05/2023 15:07   ECHOCARDIOGRAM COMPLETE  Result Date: 05/29/2023    ECHOCARDIOGRAM REPORT   Patient Name:   MCKINZLEY SONNENTAG Date of Exam: 05/29/2023 Medical Rec #:  [id number]     Height:       61.0 in Accession #:    [id number]    Weight:       120.6 lb Date of Birth:  Apr 10, 1981      BSA:          1.523 m Patient Age:    42 years      BP:           123/55 mmHg Patient Gender: F             HR:           70 bpm. Exam Location:  Inpatient Procedure: 2D Echo, Cardiac Doppler and Color Doppler Indications:    Stroke I63.9  History:        Patient has prior history of Echocardiogram examinations, most                 recent 07/24/2022. CAD, PAD and Stroke; Risk                 Factors:Hypertension, Diabetes, Dyslipidemia and Non-Smoker.  Sonographer:    Dondra Prader RVT RCS Referring Phys: [id number] ASHISH ARORA  Sonographer Comments: Technically challenging due to wound, patient position and lack of cooperation. Patient appeared to be agitated. IMPRESSIONS  1. Left ventricular ejection fraction, by estimation, is 25 to 30%. The left ventricle has severely decreased function. The left ventricle demonstrates regional wall motion abnormalities (see scoring diagram/findings for description). There is moderate asymmetric left ventricular hypertrophy of the septal segment. Indeterminate diastolic filling due to E-A fusion.  2. Right  ventricular systolic function is moderately reduced. The right ventricular size is mildly enlarged.  3. Catheter tip noted in the RA.  4. The mitral valve is degenerative. Mild to moderate mitral valve regurgitation. No evidence of mitral stenosis.  5. Tricuspid valve regurgitation is moderate to severe.  6. Focal calcification of the NCC noted. The aortic valve is tricuspid. Aortic valve regurgitation is not visualized. Aortic valve sclerosis is present, with no evidence of aortic valve stenosis. Comparison(s): Changes from prior study are noted. The left ventricular function is significantly worse. LVEF now severely reduced with WMA. RV dysfunction now present. FINDINGS  Left Ventricle: Left ventricular ejection fraction, by estimation, is 25 to 30%. The left ventricle has severely decreased function. The left ventricle demonstrates regional wall motion abnormalities. The left ventricular internal cavity size was normal  in size. There is moderate asymmetric left ventricular hypertrophy of the septal segment. Indeterminate diastolic filling due to E-A fusion.  LV Wall Scoring: The apical lateral segment and mid anterolateral segment are akinetic. Right Ventricle: The right ventricular size is mildly enlarged. No increase in right ventricular wall thickness. Right ventricular systolic function is moderately reduced. Left Atrium: Left atrial size was normal in size. Right Atrium: Catheter tip noted in the RA. Right atrial size was normal in size. Pericardium: Trivial pericardial effusion is present. Mitral Valve: The mitral valve is degenerative in appearance. Mild to moderate mitral annular calcification. Mild to moderate mitral valve regurgitation. No evidence of mitral valve stenosis. Tricuspid Valve: The tricuspid valve is grossly normal. Tricuspid valve regurgitation is moderate to severe.  No evidence of tricuspid stenosis. Aortic Valve: Focal calcification of the NCC noted. The aortic valve is tricuspid.  Aortic valve regurgitation is not visualized. Aortic valve sclerosis is present, with no evidence of aortic valve stenosis. Aortic valve mean gradient measures 2.0 mmHg. Aortic valve peak gradient measures 3.6 mmHg. Aortic valve area, by VTI measures 1.87 cm. Pulmonic Valve: The pulmonic valve was grossly normal. Pulmonic valve regurgitation is mild. No evidence of pulmonic stenosis. Aorta: The aortic root and ascending aorta are structurally normal, with no evidence of dilitation. Venous: The inferior vena cava was not well visualized. IAS/Shunts: The atrial septum is grossly normal.  LEFT VENTRICLE PLAX 2D LVIDd:         3.80 cm   Diastology LVIDs:         3.00 cm   LV e' medial:    4.40 cm/s LV PW:         1.10 cm   LV E/e' medial:  22.6 LV IVS:        1.50 cm   LV e' lateral:   11.00 cm/s LVOT diam:     1.80 cm   LV E/e' lateral: 9.0 LV SV:         27 LV SV Index:   18 LVOT Area:     2.54 cm  RIGHT VENTRICLE RV S prime:     8.52 cm/s TAPSE (M-mode): 0.8 cm LEFT ATRIUM             Index        RIGHT ATRIUM           Index LA diam:        3.20 cm 2.10 cm/m   RA Area:     10.60 cm LA Vol (A2C):   27.7 ml 18.18 ml/m  RA Volume:   23.00 ml  15.10 ml/m LA Vol (A4C):   32.3 ml 21.20 ml/m LA Biplane Vol: 32.4 ml 21.27 ml/m  AORTIC VALVE                    PULMONIC VALVE AV Area (Vmax):    2.02 cm     PV Vmax:          0.65 m/s AV Area (Vmean):   1.89 cm     PV Peak grad:     1.7 mmHg AV Area (VTI):     1.87 cm     PR End Diast Vel: 2.33 msec AV Vmax:           95.00 cm/s AV Vmean:          62.200 cm/s AV VTI:            0.144 m AV Peak Grad:      3.6 mmHg AV Mean Grad:      2.0 mmHg LVOT Vmax:         75.50 cm/s LVOT Vmean:        46.100 cm/s LVOT VTI:          0.106 m LVOT/AV VTI ratio: 0.74  AORTA Ao Root diam: 2.50 cm Ao Asc diam:  2.80 cm MITRAL VALVE               TRICUSPID VALVE MV Area (PHT): 6.83 cm    TR Peak grad:   21.0 mmHg MV Decel Time: 111 msec    TR Vmax:        229.00 cm/s MV E velocity:  99.30 cm/s MV A velocity: 32.80  cm/s  SHUNTS MV E/A ratio:  3.03        Systemic VTI:  0.11 m                            Systemic Diam: 1.80 cm Lennie Odor MD Electronically signed by Lennie Odor MD Signature Date/Time: 05/29/2023/3:22:31 PM    Final    EEG adult  Result Date: 05/29/2023 Charlsie Quest, MD     05/29/2023  5:18 PM Patient Name: Keanu Grumbine MRN: [id number] Epilepsy Attending: Charlsie Quest Referring Physician/Provider: Marvel Plan, MD Date: 05/29/2023 Duration: 25.11 mins Patient history: 42yo F with ams getting eeg to evaluate for seizure Level of alertness: comatose/ lethargic AEDs during EEG study: None Technical aspects: This EEG study was done with scalp electrodes positioned according to the 10-20 International system of electrode placement. Electrical activity was reviewed with band pass filter of 1-70Hz , sensitivity of 7 uV/mm, display speed of 41mm/sec with a 60Hz  notched filter applied as appropriate. EEG data were recorded continuously and digitally stored.  Video monitoring was available and reviewed as appropriate. Description: EEG showed continuous generalized and maximal right fronto-parietal rhythmic 2-3hz  delta slowing. Hyperventilation and photic stimulation were not performed.   ABNORMALITY - Continuous slow, generalized and maximal right fronto-parietal IMPRESSION: This study is suggestive of cortical dysfunction arising from right fronto-parietal, likely secondary to underlying structural abnormality. Additionally there is severe diffuse encephalopathy. No seizures or epileptiform discharges were seen throughout the recording. Charlsie Quest   CT ANGIO HEAD NECK W WO CM  Result Date: 05/29/2023 CLINICAL DATA:  Stroke, determine embolic source. EXAM: CT ANGIOGRAPHY HEAD AND NECK WITH AND WITHOUT CONTRAST TECHNIQUE: Multidetector CT imaging of the head and neck was performed using the standard protocol during bolus administration of intravenous contrast.  Multiplanar CT image reconstructions and MIPs were obtained to evaluate the vascular anatomy. Carotid stenosis measurements (when applicable) are obtained utilizing NASCET criteria, using the distal internal carotid diameter as the denominator. RADIATION DOSE REDUCTION: This exam was performed according to the departmental dose-optimization program which includes automated exposure control, adjustment of the mA and/or kV according to patient size and/or use of iterative reconstruction technique. 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 Chronic small vessel ischemic gliosis in the cerebral white matter with chronic perforator infarct in the left frontal white matter better seen on prior MRI. No evidence of acute hemorrhage, hydrocephalus, or mass. Generalized brain atrophy, premature for age. Vascular: No hyperdense vessel or unexpected calcification. Skull: Sclerotic skull attributed to the end-stage renal disease. Sinuses/Orbits: Chronic left mastoid opacification with under pneumatization and sclerosis. Review of the MIP images confirms the above findings CTA NECK FINDINGS Aortic arch: Unremarkable Right carotid system: Mild atheromatous plaque at the bifurcation. No stenosis or ulceration. Left carotid system: Mild atheromatous plaque at the bifurcation. No stenosis or ulceration. Vertebral arteries: No proximal subclavian stenosis. Scattered calcified plaque without flow reducing stenosis, beading, or ulceration. Skeleton: Negative Other neck: Scattered intravenous gas attributed to IV access. Small right globe with signs of chronic sub retinal collection. Upper chest: Small branching micro nodules in the apical  lungs. Review of the MIP images confirms the above findings CTA HEAD FINDINGS  Anterior circulation: Atheromatous calcification of the cavernous carotids to the degree that lumen visualization is difficult. No branch occlusion, beading, aneurysm, or proximal flow reducing stenosis. Posterior circulation: Vertebral and basilar arteries are smoothly contoured and widely patent. Calcified plaque is seen along the bilateral V4 segments. No branch occlusion, beading, aneurysm, or proximal flow reducing stenosis. Venous sinuses: Unremarkable Anatomic variants: None significant Review of the MIP images confirms the above findings IMPRESSION: Head CT: 1. Cortical calcification involving the right more than left posterior cerebral hemispheres, likely from ischemia or PRES and accentuated by end-stage renal disease with calcium dysregulation. 2. No acute hemorrhage. CTA: 1. No emergent vascular finding. 2. Atherosclerosis without flow reducing stenosis or ulceration of major arteries in the head and neck. Limited assessment of the cavernous carotids due to the degree of calcified plaque obscuring the lumen. 3. Micro nodules in the apical lungs suggesting bronchiolitis, correlate for acute infectious symptoms. Electronically Signed   By: Tiburcio Pea M.D.   On: 05/29/2023 06:49   MR BRAIN WO CONTRAST  Result Date: 05/28/2023 CLINICAL DATA:  Stroke suspected (Ped 0-17y) EXAM: MRI HEAD WITHOUT CONTRAST TECHNIQUE: Multiplanar, multiecho pulse sequences of the brain and surrounding structures were obtained without intravenous contrast. COMPARISON:  Same day CT head FINDINGS: Limitations: Sagittal T1 weighted sequences were not acquired. Brain: There is a punctate acute infarct in the right frontal lobe (series 2, image 42). Cortical hyperintense signal on diffusion-weighted imaging in the right parieto-occipital region is favored to represent regions of acute to subacute infarct. The hyperdense appearance seen on same day  head CT likely represents infarct related enhancement. There are chronic infarcts in the left cerebellum in the left frontal lobe. Findings in the left parietal lobe likely represent changes related to cortical laminar necrosis. No acute hemorrhage. No hydrocephalus. No extra-axial fluid collection. No mass effect. No mass lesion. There are additional small punctate microhemorrhages predominantly in a central location, possibly secondary to hypertensive microangiopathy. Vascular: Normal flow voids. Skull and upper cervical spine: Normal marrow signal. Sinuses/Orbits: No mastoid or middle ear effusion. Mild mucosal thickening of the floor of the right maxillary sinus. Bilateral lens replacement. There are chronic changes involving the right globe with likely chronic layering vitreal blood products on the right. Other: None. IMPRESSION: 1. Punctate acute infarct in the right frontal lobe. 2. Cortical hyperintense signal on diffusion-weighted imaging in the right parieto-occipital region is favored to represent regions of acute to subacute infarct. The hyperdense appearance seen on same day head CT likely represents infarct related enhancement. Further evaluation with contrast-enhanced brain MRI is recommended. 3. 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 4. 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 These results will be called to the ordering clinician or representative by the Radiologist Assistant, and communication documented in the PACS or [employer]. Electronically Signed   By: Lorenza Cambridge M.D.   On: 05/28/2023 15:16   CT CHEST ABDOMEN PELVIS W CONTRAST  Result Date: 05/26/2023 CLINICAL DATA:  Sepsis. Altered mental status. Open abdominal wound. EXAM: CT CHEST, ABDOMEN, AND PELVIS WITH CONTRAST TECHNIQUE: Multidetector CT imaging of the chest, abdomen and pelvis was performed following the standard protocol during bolus administration of intravenous  contrast. RADIATION DOSE REDUCTION: This exam was performed according to the departmental dose-optimization program which includes automated exposure control, adjustment of the mA and/or kV according to patient size and/or use of iterative reconstruction technique. CONTRAST:  60mL OMNIPAQUE IOHEXOL 350 MG/ML SOLN COMPARISON:  CT  abdomen pelvis dated 05/25/2023. FINDINGS: CT CHEST FINDINGS Cardiovascular: There is no cardiomegaly or pericardial effusion. Advanced 3 vessel coronary vascular calcification. Left IJ central venous line with tip at the cavoatrial junction. There is mild atherosclerotic calcification of the thoracic aorta. No aneurysmal dilatation or dissection. The origins of the great vessels of the aortic arch and the central pulmonary arteries appear patent as visualized. Mediastinum/Nodes: No hilar or mediastinal adenopathy. The esophagus is grossly unremarkable. No mediastinal fluid collection. Lungs/Pleura: Clusters of nodular densities with tree-in-bud appearance bilaterally most consistent with an infectious process, possibly atypical in etiology or aspiration. Clinical correlation is recommended. No consolidative changes. There is no pleural effusion or pneumothorax. The central airways are patent. Musculoskeletal: There is diffuse subcutaneous edema. No fluid collection. No acute osseous pathology. 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 CT ABDOMEN PELVIS FINDINGS No intra-abdominal free air or free fluid. Hepatobiliary: The liver is unremarkable. Mild biliary dilatation. High attenuating content within the gallbladder as seen previously and may represent vicarious excretion of contrast versus gallstones. No pericholecystic fluid. Pancreas: The pancreas is atrophic for age. There is mild dilatation of the main pancreatic duct. No active inflammatory changes. Spleen: Normal in size without focal abnormality. Adrenals/Urinary Tract: The adrenal glands are  unremarkable. Extensive renal vascular calcification. There is moderate bilateral renal parenchyma atrophy. There is no hydronephrosis on either side. The urinary bladder is collapsed. There is diffuse thickened appearance of the bladder wall with speckled calcification, likely sequela chronic infection/inflammation. Correlation with urinalysis recommended to evaluate for active cystitis. Stomach/Bowel: Diffusely thickened colon may be related to underdistention or represent pancolitis. Clinical correlation recommended. 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 No bowel obstruction. Vascular/Lymphatic: Advanced aortoiliac atherosclerotic disease. There is extensive calcification of the mesenteric arteries. The IVC is unremarkable. No portal venous gas. There is no adenopathy. Reproductive: The uterus is anteverted. Diffuse calcification of uterine vasculature. No adnexal masses. Other: Midline vertical anterior pelvic wall open surgical wound. No fluid collection. There is diffuse subcutaneous edema. Musculoskeletal: No acute osseous pathology. IMPRESSION: 1. Clusters of nodular densities with tree-in-bud appearance bilaterally most consistent with an infectious process, possibly atypical in etiology or aspiration. 2. Diffusely thickened colon may be related to underdistention or represent pancolitis. 3. 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 No bowel obstruction. 4. Diffuse thickened appearance of the bladder wall. Correlation with urinalysis recommended to evaluate for active cystitis. 5.  Aortic Atherosclerosis (ICD10-I70.0). Electronically Signed   By: Elgie Collard M.D.   On: 06/08/2023 19:23   DG Chest Portable 1 View  Result Date: 05/23/2023 CLINICAL DATA:  Shortness of breath. EXAM: PORTABLE CHEST 1 VIEW COMPARISON:  Most recent radiograph 05/18/2023 FINDINGS: Left-sided dialysis catheter tip in the right atrium. Heart size  upper normal. Stable mediastinal contours. Coronary stent visualized. No focal airspace disease, pleural effusion, pulmonary edema or pneumothorax. IMPRESSION: No acute findings or explanation for shortness of breath. Electronically Signed   By: Narda Rutherford M.D.   On: 06/11/2023 17:26   CT Head Wo Contrast  Result Date: 06/10/2023 CLINICAL DATA:  Headache, neuro deficit AMS unequal pupils EXAM: CT HEAD WITHOUT CONTRAST TECHNIQUE: Contiguous axial images were obtained from the base of the skull through the vertex without intravenous contrast. RADIATION DOSE REDUCTION: This exam was performed according to the departmental dose-optimization program which includes automated exposure control, adjustment of the mA and/or kV according to patient size and/or use of iterative  reconstruction technique. COMPARISON:  CT head November 23, 2021. FINDINGS: Brain: Motion limited study. Multifocal cortical hyperdensity within the right greater than left parieto-occipital regions. No significant mass effect. No mass lesion. Patchy white matter hypodensities likely represent chronic microvascular ischemic disease. 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 Vascular: No hyperdense vessel. Skull: No acute fracture. Sinuses/Orbits: Mostly clear sinuses.  No acute orbital findings. Other: No mastoid effusions. IMPRESSION: Multifocal likely cortical hyperdensity involving the right greater than left parieto-occipital regions, suspicious for cortical laminar necrosis due to subacute infarct and/or small volume of acute/recent hemorrhage. No significant mass effect. Recommend MRI brain to further evaluate. Findings discussed with Dr. Ledon Snare via telephone at 5:03 p.m. Electronically Signed   By: Feliberto Harts M.D.   On: 05/31/2023 17:04   CT ABDOMEN PELVIS W CONTRAST  Result Date: 05/25/2023 CLINICAL DATA:  Abdominal pain and weakness. EXAM: CT ABDOMEN AND PELVIS WITH CONTRAST TECHNIQUE: Multidetector CT imaging of the abdomen  and pelvis was performed using the standard protocol following bolus administration of intravenous contrast. RADIATION DOSE REDUCTION: This exam was performed according to the departmental dose-optimization program which includes automated exposure control, adjustment of the mA and/or kV according to patient size and/or use of iterative reconstruction technique. CONTRAST:  75mL OMNIPAQUE IOHEXOL 350 MG/ML SOLN COMPARISON:  CT abdomen pelvis dated May 18, 2023. FINDINGS: Lower chest: No acute abnormality. Hepatobiliary: No focal liver abnormality. Excreted contrast within the gallbladder with few tiny filling defects consistent gallstones. No gallbladder wall thickening. Slightly increased mild common bile duct dilatation measuring 10 mm in diameter, previously 9 mm. Pancreas: Unremarkable. No pancreatic ductal dilatation or surrounding inflammatory changes. 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 No new focal abnormality. Normal size. Adrenals/Urinary Tract: Adrenal glands are unremarkable. Unchanged bilateral renal atrophy. No renal mass, calculi, or hydronephrosis. Excreted contrast within the decompressed bladder. Stomach/Bowel: The stomach is within normal limits. 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 No bowel wall thickening, distention, or surrounding inflammatory changes. Vascular/Lymphatic: Marked aortoiliac and branch vessel atherosclerotic calcification again noted. No enlarged abdominal or pelvic lymph nodes. Reproductive: Unchanged 3.6 cm degenerating fibroid in the right uterus. No adnexal mass. Other: No free fluid or pneumoperitoneum. Musculoskeletal: Partial interval healing of the midline anterior abdominal wall incision with smaller defect compared to prior study. No acute or significant osseous findings. IMPRESSION: 1. No acute intra-abdominal process. 2. Slightly increased mild common bile duct dilatation measuring 10 mm in  diameter, previously 9 mm. Correlate with LFTs. 3. Cholelithiasis. 4.  Aortic Atherosclerosis (ICD10-I70.0). Electronically Signed   By: Obie Dredge M.D.   On: 05/25/2023 14:07    Microbiology: No results found for this or any previous visit (from the past 240 hour(s)).  Time spent: 34 minutes  Signed: Alvira Philips [place], DO 07/06/2023

## 2023-07-14 NOTE — Progress Notes (Signed)
Called the sister but unable to contact

## 2023-07-14 NOTE — Progress Notes (Signed)
Called the sister POA multiple times, unable to contact. No voicemail. Called the listed phone for the husband, no ansdwer.

## 2023-07-14 DEATH — deceased
